# Patient Record
Sex: Male | Born: 1955 | Race: White | Hispanic: No | Marital: Married | Smoking: Current every day smoker
Health system: Southern US, Community
[De-identification: ages and names within clinical notes are randomized; demographics above are authoritative.]

## PROBLEM LIST (undated history)

## (undated) DIAGNOSIS — Z87891 Personal history of nicotine dependence: Secondary | ICD-10-CM

## (undated) DIAGNOSIS — K921 Melena: Secondary | ICD-10-CM

## (undated) DIAGNOSIS — K121 Other forms of stomatitis: Secondary | ICD-10-CM

## (undated) DIAGNOSIS — C3490 Malignant neoplasm of unspecified part of unspecified bronchus or lung: Secondary | ICD-10-CM

## (undated) DIAGNOSIS — R079 Chest pain, unspecified: Secondary | ICD-10-CM

## (undated) DIAGNOSIS — K649 Unspecified hemorrhoids: Secondary | ICD-10-CM

## (undated) DIAGNOSIS — J449 Chronic obstructive pulmonary disease, unspecified: Secondary | ICD-10-CM

## (undated) DIAGNOSIS — I509 Heart failure, unspecified: Secondary | ICD-10-CM

## (undated) DIAGNOSIS — I7300 Raynaud's syndrome without gangrene: Secondary | ICD-10-CM

## (undated) DIAGNOSIS — R059 Cough, unspecified: Secondary | ICD-10-CM

## (undated) DIAGNOSIS — IMO0001 Reserved for inherently not codable concepts without codable children: Secondary | ICD-10-CM

## (undated) DIAGNOSIS — G4730 Sleep apnea, unspecified: Secondary | ICD-10-CM

## (undated) DIAGNOSIS — J189 Pneumonia, unspecified organism: Secondary | ICD-10-CM

## (undated) DIAGNOSIS — R490 Dysphonia: Secondary | ICD-10-CM

## (undated) DIAGNOSIS — G4733 Obstructive sleep apnea (adult) (pediatric): Secondary | ICD-10-CM

## (undated) DIAGNOSIS — B019 Varicella without complication: Secondary | ICD-10-CM

## (undated) DIAGNOSIS — Z860100 Personal history of colon polyps, unspecified: Secondary | ICD-10-CM

## (undated) DIAGNOSIS — T4145XA Adverse effect of unspecified anesthetic, initial encounter: Secondary | ICD-10-CM

## (undated) DIAGNOSIS — M75100 Unspecified rotator cuff tear or rupture of unspecified shoulder, not specified as traumatic: Secondary | ICD-10-CM

## (undated) DIAGNOSIS — I499 Cardiac arrhythmia, unspecified: Secondary | ICD-10-CM

## (undated) DIAGNOSIS — Z8619 Personal history of other infectious and parasitic diseases: Secondary | ICD-10-CM

## (undated) DIAGNOSIS — G43909 Migraine, unspecified, not intractable, without status migrainosus: Secondary | ICD-10-CM

## (undated) DIAGNOSIS — J45909 Unspecified asthma, uncomplicated: Secondary | ICD-10-CM

## (undated) DIAGNOSIS — I10 Essential (primary) hypertension: Secondary | ICD-10-CM

## (undated) DIAGNOSIS — T8859XA Other complications of anesthesia, initial encounter: Secondary | ICD-10-CM

## (undated) DIAGNOSIS — K219 Gastro-esophageal reflux disease without esophagitis: Secondary | ICD-10-CM

## (undated) DIAGNOSIS — R05 Cough: Secondary | ICD-10-CM

## (undated) DIAGNOSIS — I2510 Atherosclerotic heart disease of native coronary artery without angina pectoris: Secondary | ICD-10-CM

## (undated) DIAGNOSIS — F419 Anxiety disorder, unspecified: Secondary | ICD-10-CM

## (undated) DIAGNOSIS — I209 Angina pectoris, unspecified: Secondary | ICD-10-CM

## (undated) HISTORY — DX: Sleep apnea, unspecified: G47.30

## (undated) HISTORY — PX: SKIN GRAFT: SHX250

## (undated) HISTORY — PX: SEPTOPLASTY: SUR1290

## (undated) HISTORY — PX: NASAL SINUS SURGERY: SHX719

## (undated) HISTORY — DX: Migraine, unspecified, not intractable, without status migrainosus: G43.909

## (undated) HISTORY — PX: COLONOSCOPY: SHX174

## (undated) HISTORY — DX: Personal history of nicotine dependence: Z87.891

## (undated) HISTORY — DX: Essential (primary) hypertension: I10

## (undated) HISTORY — PX: CARDIAC CATHETERIZATION: SHX172

## (undated) HISTORY — PX: BACK SURGERY: SHX140

## (undated) HISTORY — PX: CERVICAL DISCECTOMY: SHX98

## (undated) HISTORY — PX: OTHER SURGICAL HISTORY: SHX169

## (undated) HISTORY — DX: Obstructive sleep apnea (adult) (pediatric): G47.33

## (undated) HISTORY — DX: Raynaud's syndrome without gangrene: I73.00

---

## 2000-02-26 ENCOUNTER — Encounter: Admission: RE | Admit: 2000-02-26 | Discharge: 2000-02-26 | Payer: Self-pay | Admitting: Neurosurgery

## 2000-02-26 ENCOUNTER — Encounter: Payer: Self-pay | Admitting: Neurosurgery

## 2000-06-24 ENCOUNTER — Ambulatory Visit (HOSPITAL_COMMUNITY): Admission: RE | Admit: 2000-06-24 | Discharge: 2000-06-24 | Payer: Self-pay | Admitting: Neurosurgery

## 2000-06-24 ENCOUNTER — Encounter: Payer: Self-pay | Admitting: Neurosurgery

## 2005-09-10 ENCOUNTER — Ambulatory Visit: Payer: Self-pay | Admitting: Internal Medicine

## 2005-11-15 ENCOUNTER — Ambulatory Visit: Payer: Self-pay | Admitting: Internal Medicine

## 2006-11-14 ENCOUNTER — Ambulatory Visit: Payer: Self-pay

## 2007-01-16 ENCOUNTER — Ambulatory Visit: Payer: Self-pay | Admitting: Pain Medicine

## 2007-02-10 ENCOUNTER — Ambulatory Visit: Payer: Self-pay | Admitting: Psychiatry

## 2007-11-21 ENCOUNTER — Ambulatory Visit: Payer: Self-pay | Admitting: Gastroenterology

## 2007-12-12 ENCOUNTER — Ambulatory Visit: Payer: Self-pay | Admitting: Gastroenterology

## 2008-01-08 ENCOUNTER — Ambulatory Visit: Payer: Self-pay | Admitting: Gastroenterology

## 2008-07-16 ENCOUNTER — Ambulatory Visit: Payer: Self-pay | Admitting: Gastroenterology

## 2008-10-15 ENCOUNTER — Ambulatory Visit: Payer: Self-pay | Admitting: Internal Medicine

## 2009-02-18 ENCOUNTER — Ambulatory Visit: Payer: Self-pay | Admitting: Internal Medicine

## 2009-03-10 ENCOUNTER — Ambulatory Visit: Payer: Self-pay | Admitting: Internal Medicine

## 2009-04-21 ENCOUNTER — Ambulatory Visit: Payer: Self-pay | Admitting: Internal Medicine

## 2009-07-28 ENCOUNTER — Ambulatory Visit: Payer: Self-pay | Admitting: Otolaryngology

## 2009-08-04 ENCOUNTER — Ambulatory Visit: Payer: Self-pay | Admitting: Unknown Physician Specialty

## 2009-08-06 ENCOUNTER — Ambulatory Visit: Payer: Self-pay | Admitting: Otolaryngology

## 2009-12-17 ENCOUNTER — Ambulatory Visit: Payer: Self-pay | Admitting: Physician Assistant

## 2010-04-01 ENCOUNTER — Ambulatory Visit: Payer: Self-pay | Admitting: Oncology

## 2010-04-30 ENCOUNTER — Ambulatory Visit: Payer: Self-pay | Admitting: Oncology

## 2010-05-01 ENCOUNTER — Ambulatory Visit: Payer: Self-pay | Admitting: Oncology

## 2010-06-01 ENCOUNTER — Ambulatory Visit: Payer: Self-pay | Admitting: Oncology

## 2010-08-20 ENCOUNTER — Ambulatory Visit: Payer: Self-pay | Admitting: Oncology

## 2010-09-01 ENCOUNTER — Ambulatory Visit: Payer: Self-pay | Admitting: Oncology

## 2010-10-29 ENCOUNTER — Ambulatory Visit: Payer: Self-pay | Admitting: Oncology

## 2010-11-01 ENCOUNTER — Ambulatory Visit: Payer: Self-pay | Admitting: Oncology

## 2011-01-28 ENCOUNTER — Ambulatory Visit: Payer: Self-pay | Admitting: Oncology

## 2011-01-31 ENCOUNTER — Ambulatory Visit: Payer: Self-pay | Admitting: Oncology

## 2011-04-28 ENCOUNTER — Ambulatory Visit: Payer: Self-pay | Admitting: Oncology

## 2011-05-02 ENCOUNTER — Ambulatory Visit: Payer: Self-pay | Admitting: Oncology

## 2011-05-16 ENCOUNTER — Ambulatory Visit: Payer: Self-pay | Admitting: Otolaryngology

## 2011-06-30 ENCOUNTER — Ambulatory Visit: Payer: Self-pay | Admitting: Oncology

## 2011-07-03 ENCOUNTER — Ambulatory Visit: Payer: Self-pay | Admitting: Oncology

## 2011-08-27 ENCOUNTER — Ambulatory Visit: Payer: Self-pay | Admitting: Oncology

## 2011-09-02 ENCOUNTER — Ambulatory Visit: Payer: Self-pay | Admitting: Oncology

## 2011-10-02 ENCOUNTER — Ambulatory Visit: Payer: Self-pay | Admitting: Oncology

## 2011-11-04 ENCOUNTER — Ambulatory Visit: Payer: Self-pay | Admitting: Sports Medicine

## 2011-11-12 ENCOUNTER — Ambulatory Visit: Payer: Self-pay | Admitting: Oncology

## 2011-11-12 LAB — CANCER CENTER HEMOGLOBIN: HGB: 17.6 g/dL (ref 13.0–18.0)

## 2011-12-03 ENCOUNTER — Ambulatory Visit: Payer: Self-pay | Admitting: Oncology

## 2011-12-31 ENCOUNTER — Ambulatory Visit: Payer: Self-pay | Admitting: Oncology

## 2011-12-31 LAB — CBC CANCER CENTER
Eosinophil #: 0.2 x10 3/mm (ref 0.0–0.7)
Lymphocyte #: 1.9 x10 3/mm (ref 1.0–3.6)
Lymphocyte %: 13.9 %
MCHC: 33.8 g/dL (ref 32.0–36.0)
Monocyte %: 1 %
Platelet: 204 x10 3/mm (ref 150–440)
RDW: 14 % (ref 11.5–14.5)
WBC: 13.8 x10 3/mm — ABNORMAL HIGH (ref 3.8–10.6)

## 2012-01-26 ENCOUNTER — Ambulatory Visit: Payer: Self-pay | Admitting: Unknown Physician Specialty

## 2012-01-26 DIAGNOSIS — I10 Essential (primary) hypertension: Secondary | ICD-10-CM

## 2012-01-26 LAB — URINALYSIS, COMPLETE
Blood: NEGATIVE
Glucose,UR: NEGATIVE mg/dL (ref 0–75)
Ketone: NEGATIVE
Leukocyte Esterase: NEGATIVE
Nitrite: NEGATIVE
RBC,UR: NONE SEEN /HPF (ref 0–5)

## 2012-01-31 ENCOUNTER — Ambulatory Visit: Payer: Self-pay | Admitting: Oncology

## 2012-02-08 ENCOUNTER — Ambulatory Visit: Payer: Self-pay | Admitting: Unknown Physician Specialty

## 2012-07-04 DIAGNOSIS — E291 Testicular hypofunction: Secondary | ICD-10-CM | POA: Insufficient documentation

## 2012-07-04 DIAGNOSIS — Z79899 Other long term (current) drug therapy: Secondary | ICD-10-CM | POA: Insufficient documentation

## 2012-07-04 DIAGNOSIS — N529 Male erectile dysfunction, unspecified: Secondary | ICD-10-CM | POA: Insufficient documentation

## 2012-07-04 DIAGNOSIS — N401 Enlarged prostate with lower urinary tract symptoms: Secondary | ICD-10-CM | POA: Insufficient documentation

## 2012-07-18 ENCOUNTER — Ambulatory Visit: Payer: Self-pay | Admitting: Gastroenterology

## 2012-07-20 LAB — PATHOLOGY REPORT

## 2012-08-29 ENCOUNTER — Ambulatory Visit: Payer: Self-pay | Admitting: Gastroenterology

## 2012-09-08 ENCOUNTER — Ambulatory Visit: Payer: Self-pay | Admitting: Gastroenterology

## 2012-12-25 ENCOUNTER — Ambulatory Visit: Payer: Self-pay | Admitting: Otolaryngology

## 2012-12-25 LAB — CBC WITH DIFFERENTIAL/PLATELET
Basophil %: 1.2 %
Eosinophil #: 0.2 10*3/uL (ref 0.0–0.7)
HCT: 47.7 % (ref 40.0–52.0)
HGB: 16.8 g/dL (ref 13.0–18.0)
Lymphocyte #: 2.5 10*3/uL (ref 1.0–3.6)
Monocyte #: 0.7 x10 3/mm (ref 0.2–1.0)
Monocyte %: 5.6 %
Neutrophil %: 70.9 %
WBC: 12 10*3/uL — ABNORMAL HIGH (ref 3.8–10.6)

## 2012-12-25 LAB — BASIC METABOLIC PANEL
Anion Gap: 3 — ABNORMAL LOW (ref 7–16)
Calcium, Total: 8.9 mg/dL (ref 8.5–10.1)
Creatinine: 0.88 mg/dL (ref 0.60–1.30)
EGFR (African American): >60
Osmolality: 270 (ref 275–301)
Sodium: 136 mmol/L (ref 136–145)

## 2013-01-03 ENCOUNTER — Ambulatory Visit: Payer: Self-pay | Admitting: Otolaryngology

## 2013-01-04 LAB — PATHOLOGY REPORT

## 2013-04-24 ENCOUNTER — Ambulatory Visit (INDEPENDENT_AMBULATORY_CARE_PROVIDER_SITE_OTHER): Payer: BC Managed Care – PPO | Admitting: Pulmonary Disease

## 2013-04-24 ENCOUNTER — Encounter: Payer: Self-pay | Admitting: Pulmonary Disease

## 2013-04-24 VITALS — BP 104/80 | HR 77 | Temp 98.3°F | Ht 72.0 in | Wt 200.0 lb

## 2013-04-24 DIAGNOSIS — F17200 Nicotine dependence, unspecified, uncomplicated: Secondary | ICD-10-CM | POA: Insufficient documentation

## 2013-04-24 DIAGNOSIS — R05 Cough: Secondary | ICD-10-CM

## 2013-04-24 DIAGNOSIS — Z720 Tobacco use: Secondary | ICD-10-CM

## 2013-04-24 DIAGNOSIS — R059 Cough, unspecified: Secondary | ICD-10-CM | POA: Insufficient documentation

## 2013-04-24 DIAGNOSIS — J449 Chronic obstructive pulmonary disease, unspecified: Secondary | ICD-10-CM

## 2013-04-24 DIAGNOSIS — G4733 Obstructive sleep apnea (adult) (pediatric): Secondary | ICD-10-CM

## 2013-04-24 MED ORDER — TRAMADOL HCL 50 MG PO TABS: 50.0000 mg | ORAL_TABLET | Freq: Four times a day (QID) | ORAL | Status: DC | PRN

## 2013-04-24 MED ORDER — BUPROPION HCL ER (SR) 100 MG PO TB12: 100.0000 mg | ORAL_TABLET | Freq: Two times a day (BID) | ORAL | Status: DC

## 2013-04-24 MED ORDER — TIOTROPIUM BROMIDE MONOHYDRATE 18 MCG IN CAPS: 18.0000 ug | ORAL_CAPSULE | Freq: Every day | RESPIRATORY_TRACT | Status: DC

## 2013-04-24 NOTE — Assessment & Plan Note (Signed)
This is do to ongoing smoking, postnasal drip from chronic rhinitis and sinus infections, and COPD.  I also believe that there is a component of airway irritation coming from chronic cough which is in fact causing more cough. We need to try to break the cycle of cough. With ongoing smoking this will be very difficult.  Plan: -Quit smoking, quit smoking, quit smoking -Tramadol to be used on an as-needed basis during a period of voice rest -Hard candies, warm beverages as needed to help soothe the throat during period of voice rest -Start Spiriva for COPD

## 2013-04-24 NOTE — Assessment & Plan Note (Signed)
He has not been compliant with therapy. Unfortunately given his coronary disease and hypertension untreated obstructive sleep apnea will magnify these problems.  Plan: -Educated to contact his CPAP supplier to find a better mask -Advised him to try using a mask while awake and watching television to try to get used to it -Encouraged regular CPAP use

## 2013-04-24 NOTE — Progress Notes (Signed)
Subjective:    Patient ID: Paul Carpenter, male    DOB: Jun 11, 1956, 57 y.o.   MRN: [id number]  HPI  This is a 57 year old male who smoked 2 to have packs of cigarettes daily since age 56 who comes to our clinic today for evaluation and management of COPD and cough. He had a normal childhood without respiratory illnesses. 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  For the last several months he's had a worse cough in the evenings. The cough persists throughout the daytime and is typically productive of clear to yellow sputum. It is quite frequent at night and keeps him up in the evenings. His heartburn is well controlled. He does have chronic sinus infections. He has had multiple sinus surgeries in the past. He has been treated for either bronchitis or sinus infections with antibiotics and steroids 5 or 6 times in the last 12 months. He has not been admitted for shortness of breath or respiratory problems.  He has used long acting bronchodilators and corticosteroids in the past but only on an as-needed basis and never really saw much improvement with them. He was diagnosed as COPD over 5 years ago by a pulmonologist he no longer practices here in town. He was also told he had emphysema.  He has been followed by cardiology, Dr. Avelino Leeds and apparently he has non-obstructive coronary artery disease.  He has tried quitting multiple times. He has used Chantix, Wellbutrin, and electronic cigarettes. He is also use nicotine gum in the past. Has had the best results with Wellbutrin. He notes that he only used 150 mg daily of Wellbutrin.    Past Medical History  Diagnosis Date  . Hypertension   . OSA (obstructive sleep apnea)     has CPAP but does not use     Family History  Problem Relation Age of Onset  . Heart disease Paternal  Grandmother   . Heart disease Father   . Heart attack Maternal Grandfather 52  . Prostate cancer Father      History   Social History  . Marital Status: Married    Spouse Name: N/A    Number of Children: 3  . Years of Education: N/A   Occupational History  . Holiday representative Work    Social History Main Topics  . Smoking status: Current Every Day Smoker -- 2.50 packs/day for 40 years    Types: Cigarettes  . Smokeless tobacco: Not on file  . Alcohol Use: 1.0 oz/week    2 drink(s) per week  . Drug Use: No  . Sexually Active: Not on file   Other Topics Concern  . Not on file   Social History Narrative  . No narrative on file     No Known Allergies   No outpatient prescriptions prior to visit.   No facility-administered medications prior to visit.      Review of Systems  Constitutional: Negative for fever, chills, activity change and appetite change.  HENT: Positive for congestion, rhinorrhea and postnasal drip. Negative for hearing loss, ear pain, sneezing, neck pain, neck stiffness and sinus pressure.   Eyes: Negative for redness, itching and visual disturbance.  Respiratory: Positive for cough, chest tightness and shortness of breath. Negative for wheezing.   Cardiovascular: Negative for chest pain, palpitations and leg swelling.  Gastrointestinal: Negative for  nausea, vomiting, abdominal pain, diarrhea, constipation, blood in stool and abdominal distention.  Musculoskeletal: Negative for myalgias, joint swelling, arthralgias and gait problem.  Skin: Negative for rash.  Neurological: Positive for dizziness and light-headedness. Negative for numbness and headaches.  Hematological: Bruises/bleeds easily.  Psychiatric/Behavioral: Negative for confusion and dysphoric mood.       Objective:   Physical Exam  Filed Vitals:   04/24/13 1550  BP: 104/80  Pulse: 77  Temp: 98.3 F (36.8 C)  TempSrc: Oral  Height: 6' (1.829 m)  Weight: 200 lb (90.719 kg)  SpO2: 96%   RA  Gen: well appearing, no acute distress HEENT: NCAT, PERRL, EOMi, OP clear, neck supple without masses PULM: Moderately reduced air movement but CTA B, normal percussion CV: RRR, no mgr, no JVD AB: BS+, soft, nontender, no hsm Ext: warm, no edema, no clubbing, no cyanosis Derm: no rash or skin breakdown Neuro: A&Ox4, CN II-XII intact, strength 5/5 in all 4 extremities      Assessment & Plan:   COPD (chronic obstructive pulmonary disease) COPD: GOLD Stage 2, Grade C Combined recommendations from the [employer], [employer] of [employer]icians, Designer, television/film set, European Respiratory Society (Qaseem A et al, Ann Intern Med. 2011;155(3):179) recommends tobacco cessation, pulmonary rehab (for symptomatic patients with an FEV1 < 50% predicted), supplemental oxygen (for patients with SaO2 <88% or paO2 <55), and appropriate bronchodilator therapy.  In regards to long acting bronchodilators, they recommend monotherapy (FEV1 60-80% with symptoms weak evidence, FEV1 with symptoms <60% strong evidence), or combination therapy (FEV1 <60% with symptoms, strong recommendation, moderate evidence).  One should also provide patients with annual immunizations and consider therapy for prevention of COPD exacerbations (ie. roflumilast or azithromycin) when appopriate.  -O2 therapy: not indicated -Immunizations: Flu UTD, update pneumovax next visit -Tobacco use: Educated at length to quit -Exercise: encouraged regular exercise -Bronchodilator therapy: Start Spiriva, continue asmanex -Exacerbation prevention: Spiriva   Cough This is do to ongoing smoking, postnasal drip from chronic rhinitis and sinus infections, and COPD.  I also believe that there is a component of airway irritation coming from chronic cough which is in fact causing more cough. We need to try to break the cycle of cough. With ongoing smoking this will be very difficult.  Plan: -Quit smoking, quit  smoking, quit smoking -Tramadol to be used on an as-needed basis during a period of voice rest -Hard candies, warm beverages as needed to help soothe the throat during period of voice rest -Start Spiriva for COPD  Tobacco abuse Given his coronary disease and COPD he really needs to quit smoking. We talked about this at length today. He has benefited most from Wellbutrin in the past but no medication has ever helped him quit completely. He wants to quit but he states that he "hasn't tried hard enough". Chantix caused him to have bad dreams.  Plan: -Start Wellbutrin again  Obstructive sleep apnea He has not been compliant with therapy. Unfortunately given his coronary disease and hypertension untreated obstructive sleep apnea will magnify these problems.  Plan: -Educated to contact his CPAP supplier to find a better mask -Advised him to try using a mask while awake and watching television to try to get used to it -Encouraged regular CPAP use   Updated Medication List Outpatient Encounter Prescriptions as of 04/24/2013  Medication Sig Dispense Refill  . atorvastatin (LIPITOR) 40 MG tablet Take 1 tablet by mouth daily.      Marland Kitchen doxycycline (VIBRAMYCIN) 100 MG capsule  Take 1 capsule by mouth 2 (two) times daily.      . fluticasone (FLONASE) 50 MCG/ACT nasal spray Place 1 spray into the nose 2 (two) times daily.      . hydrochlorothiazide (MICROZIDE) 12.5 MG capsule Take 1 capsule by mouth daily.      . mirtazapine (REMERON) 15 MG tablet Take 1 tablet by mouth at bedtime as needed.      . mometasone (ASMANEX) 220 MCG/INH inhaler Inhale 1 puff into the lungs 2 (two) times daily.      . pantoprazole (PROTONIX) 20 MG tablet Take 1 tablet by mouth daily.      . propranolol ER (INDERAL LA) 160 MG SR capsule Take 1 capsule by mouth daily.      Marland Kitchen testosterone enanthate (DELATESTRYL) 200 MG/ML injection Inject 1.3 mg into the muscle every 14 (fourteen) days. For IM use only      . buPROPion  (WELLBUTRIN SR) 100 MG 12 hr tablet Take 1 tablet (100 mg total) by mouth 2 (two) times daily.  60 tablet  2  . tiotropium (SPIRIVA HANDIHALER) 18 MCG inhalation capsule Place 1 capsule (18 mcg total) into inhaler and inhale daily.  30 capsule  2  . traMADol (ULTRAM) 50 MG tablet Take 1 tablet (50 mg total) by mouth every 6 (six) hours as needed for pain.  30 tablet  1   No facility-administered encounter medications on file as of 04/24/2013.

## 2013-04-24 NOTE — Assessment & Plan Note (Signed)
COPD: GOLD Stage 2, Grade C Combined recommendations from the [employer]	[place], [employer], Designer, television/film set, European Respiratory Society (Qaseem A et al, Ann Intern Med. 2011;155(3):179) recommends tobacco cessation, pulmonary rehab (for symptomatic patients with an FEV1 < 50% predicted), supplemental oxygen (for patients with SaO2 <88% or paO2 <55), and appropriate bronchodilator therapy.  In regards to long acting bronchodilators, they recommend monotherapy (FEV1 60-80% with symptoms weak evidence, FEV1 with symptoms <60% strong evidence), or combination therapy (FEV1 <60% with symptoms, strong recommendation, moderate evidence).  One should also provide patients with annual immunizations and consider therapy for prevention of COPD exacerbations (ie. roflumilast or azithromycin) when appopriate.  -O2 therapy: not indicated -Immunizations: Flu UTD, update pneumovax next visit -Tobacco use: Educated at length to quit -Exercise: encouraged regular exercise -Bronchodilator therapy: Start Spiriva, continue asmanex -Exacerbation prevention: Spiriva

## 2013-04-24 NOTE — Assessment & Plan Note (Signed)
Given his coronary disease and COPD he really needs to quit smoking. We talked about this at length today. He has benefited most from Wellbutrin in the past but no medication has ever helped him quit completely. He wants to quit but he states that he "hasn't tried hard enough". Chantix caused him to have bad dreams.  Plan: -Start Wellbutrin again

## 2013-04-24 NOTE — Patient Instructions (Signed)
Take the spiriva one puff daily no matter how you feel  Quit smoking, quit smoking, quit smoking  Take the Wellbutrin one pill daily for one week then two times a day  Take the tramadol as needed for cough during three days of cough suppresion/and voice rest  We will see you back in 4-6 weeks or sooner as needed

## 2013-04-25 ENCOUNTER — Ambulatory Visit (INDEPENDENT_AMBULATORY_CARE_PROVIDER_SITE_OTHER)
Admission: RE | Admit: 2013-04-25 | Discharge: 2013-04-25 | Disposition: A | Discharge status: Home / Self Care | Payer: BC Managed Care – PPO | Source: Ambulatory Visit | Attending: Pulmonary Disease | Admitting: Pulmonary Disease

## 2013-04-25 DIAGNOSIS — J449 Chronic obstructive pulmonary disease, unspecified: Secondary | ICD-10-CM

## 2013-04-27 ENCOUNTER — Telehealth: Payer: Self-pay | Admitting: Pulmonary Disease

## 2013-04-27 NOTE — Telephone Encounter (Signed)
Notes Recorded by Lupita Leash, MD on 04/26/2013 at 12:31 AM L,  Please let him know that his CXR showed emphysema, but nothing else.    I spoke with patient about results and he verbalized understanding and had no questions

## 2013-05-28 ENCOUNTER — Ambulatory Visit: Payer: Self-pay | Admitting: Gastroenterology

## 2013-05-29 ENCOUNTER — Encounter: Payer: Self-pay | Admitting: Pulmonary Disease

## 2013-05-29 ENCOUNTER — Ambulatory Visit (INDEPENDENT_AMBULATORY_CARE_PROVIDER_SITE_OTHER): Payer: BC Managed Care – PPO | Admitting: Pulmonary Disease

## 2013-05-29 VITALS — BP 114/80 | HR 80 | Temp 98.5°F

## 2013-05-29 DIAGNOSIS — R05 Cough: Secondary | ICD-10-CM

## 2013-05-29 DIAGNOSIS — F17200 Nicotine dependence, unspecified, uncomplicated: Secondary | ICD-10-CM

## 2013-05-29 DIAGNOSIS — J4489 Other specified chronic obstructive pulmonary disease: Secondary | ICD-10-CM

## 2013-05-29 DIAGNOSIS — Z720 Tobacco use: Secondary | ICD-10-CM

## 2013-05-29 DIAGNOSIS — J449 Chronic obstructive pulmonary disease, unspecified: Secondary | ICD-10-CM

## 2013-05-29 DIAGNOSIS — R059 Cough, unspecified: Secondary | ICD-10-CM

## 2013-05-29 MED ORDER — LEVOFLOXACIN 500 MG PO TABS: 500.0000 mg | ORAL_TABLET | Freq: Every day | ORAL | Status: AC

## 2013-05-29 MED ORDER — AEROCHAMBER PLUS MISC: Status: DC

## 2013-05-29 NOTE — Assessment & Plan Note (Signed)
It is not clear that the Spiriva made any difference and I question whether or not it is making his cough worse.    Plan: -quit smoking -change spiriva to Dulera bid

## 2013-05-29 NOTE — Patient Instructions (Signed)
Take Dulera two puffs twice a day instead of the Spiriva for one month; use it with a spacer  Take the Levaquin daily for one week with yogurt  Use the tramadol during the day for three days to suppress your cough.  During this time, try to minimize talking as much as possible  Stop smoking!  Let us know if you are not better before 3-4 weeks  We will see you back in 4 weeks or sooner if needed

## 2013-05-29 NOTE — Assessment & Plan Note (Addendum)
His cough is worse in the last few weeks likely due to laryngeal irritation from a sinus infection (he has green mucus production), ongoing smoking, and inflammation related to cough.  He has not really used the tramadol as directed on the last visit.  This cough is really not going to go away if he doesn't quit smoking.  Plan: -treat sinus infection with Levaquin -tramadol more often and voice rest -if no improvement in 3-4 weeks, may need to see ENT for laryngoscopy given smoking history

## 2013-05-29 NOTE — Progress Notes (Signed)
Subjective:    Patient ID: Paul Carpenter, male    DOB: 1956/03/31, 57 y.o.   MRN: [id number]  Synopsis: Paul Carpenter first saw the [facility] pulmonary clinic in July of 2014 for COPD. Simple spirometry showed a ratio 62%, FEV1 of 2.41 L (60% predicted). He had smoked 2 packs of cigarettes daily since his teenage years and continues to smoke. He has had multiple sinus infections and 2 sinus surgeries in the past.  HPI  05/29/2013 ROV >> Paul Carpenter thinks that his cough is actually worse since last visit. He is also experienced hoarseness since the last visit. He notes sinus congestion and green mucus production frequently. He takes Ultram at night it doesn't particularly making sleepy. He doesn't recall coughing frequently throughout the night but he does cough sometimes when he gets up to go the bathroom. He feels like he has a constant build up in his throat and congestion. He is constantly clearing his throat. Unclear to him that this is secondary to the Spiriva. She stopped taking the Spiriva 3 days ago to see if that would help, but unfortunately he still has the symptoms.   Past Medical History  Diagnosis Date  . Hypertension   . OSA (obstructive sleep apnea)     has CPAP but does not use     Review of Systems  Constitutional: Negative for fever, chills and fatigue.  HENT: Positive for congestion, rhinorrhea and postnasal drip.   Respiratory: Positive for cough, shortness of breath and wheezing.   Cardiovascular: Negative for chest pain, palpitations and leg swelling.       Objective:   Physical Exam Filed Vitals:   05/29/13 1520  BP: 114/80  Pulse: 80  Temp: 98.5 F (36.9 C)  TempSrc: Oral  SpO2: 95%   Gen: well appearing, no acute distress HEENT: NCAT,  EOMi, OP clear, PULM: few wheezes in bases, minimal; good air movement, normal percussion CV: RRR, no mgr, no JVD AB: BS+, soft, nontender Ext: warm, no edema, no clubbing, no cyanosis  05/2013 Simple  spirometry > ratio 62%, FEV1 2.41L (60% pred)     Assessment & Plan:   COPD (chronic obstructive pulmonary disease) It is not clear that the Spiriva made any difference and I question whether or not it is making his cough worse.    Plan: -quit smoking -change spiriva to Dulera bid  Cough His cough is worse in the last few weeks likely due to laryngeal irritation from a sinus infection (he has green mucus production), ongoing smoking, and inflammation related to cough.  He has not really used the tramadol as directed on the last visit.  This cough is really not going to go away if he doesn't quit smoking.  Plan: -treat sinus infection with Levaquin -tramadol more often and voice rest -if no improvement in 3-4 weeks, may need to see ENT for laryngoscopy given smoking history  Tobacco abuse I think it is too soon to change the Wellbutrin, but so far it hasn't helped much.    Plan: -continue Wellbutrin for now, consider change on next visit   Updated Medication List Outpatient Encounter Prescriptions as of 05/29/2013  Medication Sig Dispense Refill  . atorvastatin (LIPITOR) 40 MG tablet Take 1 tablet by mouth daily.      Marland Kitchen buPROPion (WELLBUTRIN SR) 100 MG 12 hr tablet Take 1 tablet (100 mg total) by mouth 2 (two) times daily.  60 tablet  2  . hydrochlorothiazide (MICROZIDE) 12.5 MG capsule Take 1 capsule  by mouth daily.      . mirtazapine (REMERON) 15 MG tablet Take 1 tablet by mouth at bedtime as needed.      . propranolol ER (INDERAL LA) 160 MG SR capsule Take 1 capsule by mouth daily.      Marland Kitchen testosterone enanthate (DELATESTRYL) 200 MG/ML injection Inject 1.3 mg into the muscle every 14 (fourteen) days. For IM use only      . tiotropium (SPIRIVA HANDIHALER) 18 MCG inhalation capsule Place 1 capsule (18 mcg total) into inhaler and inhale daily.  30 capsule  2  . traMADol (ULTRAM) 50 MG tablet Take 1 tablet (50 mg total) by mouth every 6 (six) hours as needed for pain.  30 tablet  1   . levofloxacin (LEVAQUIN) 500 MG tablet Take 1 tablet (500 mg total) by mouth daily.  7 tablet  0  . pantoprazole (PROTONIX) 20 MG tablet Take 1 tablet by mouth daily.      Marland Kitchen Spacer/Aero-Holding Chambers (AEROCHAMBER PLUS) inhaler Use as instructed  1 each  0  . [DISCONTINUED] doxycycline (VIBRAMYCIN) 100 MG capsule Take 1 capsule by mouth 2 (two) times daily.      . [DISCONTINUED] fluticasone (FLONASE) 50 MCG/ACT nasal spray Place 1 spray into the nose 2 (two) times daily.      . [DISCONTINUED] mometasone (ASMANEX) 220 MCG/INH inhaler Inhale 1 puff into the lungs 2 (two) times daily.       No facility-administered encounter medications on file as of 05/29/2013.

## 2013-05-29 NOTE — Assessment & Plan Note (Signed)
I think it is too soon to change the Wellbutrin, but so far it hasn't helped much.    Plan: -continue Wellbutrin for now, consider change on next visit

## 2013-06-06 ENCOUNTER — Ambulatory Visit: Payer: Self-pay | Admitting: Gastroenterology

## 2013-06-22 ENCOUNTER — Telehealth: Payer: Self-pay | Admitting: Pulmonary Disease

## 2013-06-22 DIAGNOSIS — R059 Cough, unspecified: Secondary | ICD-10-CM

## 2013-06-22 DIAGNOSIS — R05 Cough: Secondary | ICD-10-CM

## 2013-06-22 NOTE — Telephone Encounter (Signed)
LMOMTCB x1 for pt 

## 2013-06-25 MED ORDER — TRAMADOL HCL 50 MG PO TABS: 50.0000 mg | ORAL_TABLET | Freq: Four times a day (QID) | ORAL | Status: DC | PRN

## 2013-06-25 NOTE — Telephone Encounter (Signed)
Last OV 05-29-13 Dr.Mcquaid. Pt is asking for a refill on tramadol. Last Rx was given on 05-29-13 for #30 with instruct for every 6 hours. Pt states as long as he takes the tramadol every 6 hours he is able to suppress his cough mostly, but if he does not take it on schedule his cough is worse. Please advise if ok to refill tramadol and for what quantity? Carron Curie, CMA

## 2013-06-25 NOTE — Telephone Encounter (Signed)
Ok x one only then need to regroup with mcquaid

## 2013-06-25 NOTE — Telephone Encounter (Signed)
Pt has an appt on 07-03-13. rx sent. Carron Curie, CMA

## 2013-07-03 ENCOUNTER — Encounter: Payer: Self-pay | Admitting: Pulmonary Disease

## 2013-07-03 ENCOUNTER — Ambulatory Visit (INDEPENDENT_AMBULATORY_CARE_PROVIDER_SITE_OTHER): Payer: BC Managed Care – PPO | Admitting: Pulmonary Disease

## 2013-07-03 VITALS — BP 132/80 | HR 77 | Temp 98.7°F | Ht 72.0 in | Wt 198.8 lb

## 2013-07-03 DIAGNOSIS — R05 Cough: Secondary | ICD-10-CM

## 2013-07-03 DIAGNOSIS — Z720 Tobacco use: Secondary | ICD-10-CM

## 2013-07-03 DIAGNOSIS — R059 Cough, unspecified: Secondary | ICD-10-CM

## 2013-07-03 DIAGNOSIS — J449 Chronic obstructive pulmonary disease, unspecified: Secondary | ICD-10-CM

## 2013-07-03 DIAGNOSIS — F17200 Nicotine dependence, unspecified, uncomplicated: Secondary | ICD-10-CM

## 2013-07-03 MED ORDER — TRAMADOL HCL 50 MG PO TABS: 50.0000 mg | ORAL_TABLET | Freq: Four times a day (QID) | ORAL | Status: DC | PRN

## 2013-07-03 NOTE — Assessment & Plan Note (Signed)
This has been a stable interval, but then if he still suffers from cough and hoarseness which I believe is directly related to his ongoing tobacco use.  Plan: -Continue Dulera -Flu shot as soon as available -Followup with me in 3-4 months

## 2013-07-03 NOTE — Assessment & Plan Note (Signed)
We discussed this at length again today. He continues to smoke one and a half packs to 2 packs a day. This is the primary cause for his ongoing cough, hoarseness, shortness of breath which is lead to COPD. I advised him that the COPD is a permanent problem which will not get better. Hopefully quitting smoking will help with the cough. He has not done well with Chantix and Wellbutrin hasn't helped much either. I advised E. cigarettes as well as counseling from the [place] quit line. He also states that he will pursue counseling options from [facility] and [facility] as they have a contacted him to help with this in the past.

## 2013-07-03 NOTE — Assessment & Plan Note (Signed)
This has improved, but he continues to have hoarseness. I think the hoarseness is most likely related to his ongoing tobacco use, but it does concern me enough to feel that he needs to have his larynx evaluated again by [facility]. I've asked him to followup with them again for evaluation.

## 2013-07-03 NOTE — Patient Instructions (Signed)
Keep taking the [facility] as you are doing Call 1-800-Quit-Now to get free counseling for smoking from the state of [place] See Dr. Andee Poles about the hoarseness Use an e-cigarette if needed to help you quit smoking  We will see you back in 3-4 months or sooner if needed

## 2013-07-03 NOTE — Progress Notes (Signed)
Subjective:    Patient ID: Paul Carpenter, male    DOB: [birth year], 57 y.o.   MRN: [id number]  Synopsis: Paul Carpenter first saw the [facility] pulmonary clinic in July of 2014 for COPD. Simple spirometry showed a ratio 62%, FEV1 of 2.41 L (60% predicted). He had smoked 2 packs of cigarettes daily since his teenage years and continues to smoke. He has had multiple sinus infections and 2 sinus surgeries in the past.  HPI   05/29/2013 ROV >> Paul Carpenter thinks that his cough is actually worse since last visit. He is also experienced hoarseness since the last visit. He notes sinus congestion and green mucus production frequently. He takes Ultram at night it doesn't particularly making sleepy. He doesn't recall coughing frequently throughout the night but he does cough sometimes when he gets up to go the bathroom. He feels like he has a constant build up in his throat and congestion. He is constantly clearing his throat. Unclear to him that this is secondary to the Spiriva. She stopped taking the Spiriva 3 days ago to see if that would help, but unfortunately he still has the symptoms.  07/03/2013 ROV >> Paul Carpenter says that his cough is not as productive as it was since the last visit. It is improved a little bit. He still has a cough at night. He still has hoarseness. The Elwin Sleight has been tolerated well but he can't tell that it made a huge difference in his symptoms. He continues to smoke one and a half to 2 packs of cigarettes daily. He does not think the Wellbutrin has helped with this.   Past Medical History  Diagnosis Date  . Hypertension   . OSA (obstructive sleep apnea)     has CPAP but does not use     Review of Systems  Constitutional: Negative for fever, chills and fatigue.  HENT: Negative for congestion, rhinorrhea and postnasal drip.   Respiratory: Positive for cough and shortness of breath. Negative for wheezing.   Cardiovascular: Negative for chest pain, palpitations and leg  swelling.       Objective:   Physical Exam  Filed Vitals:   07/03/13 1034  BP: 132/80  Pulse: 77  Temp: 98.7 F (37.1 C)  TempSrc: Oral  Height: 6' (1.829 m)  Weight: 198 lb 12.8 oz (90.175 kg)  SpO2: 97%   Gen: well appearing, no acute distress HEENT: NCAT,  EOMi, OP clear, PULM: Good air movement bilaterally, no wheezing CV: RRR, no mgr, no JVD AB: BS+, soft, nontender Ext: warm, no edema, no clubbing, no cyanosis  05/2013 Simple spirometry > ratio 62%, FEV1 2.41L (60% pred)     Assessment & Plan:   COPD (chronic obstructive pulmonary disease) This has been a stable interval, but then if he still suffers from cough and hoarseness which I believe is directly related to his ongoing tobacco use.  Plan: -Continue Dulera -Flu shot as soon as available -Followup with me in 3-4 months  Cough This has improved, but he continues to have hoarseness. I think the hoarseness is most likely related to his ongoing tobacco use, but it does concern me enough to feel that he needs to have his larynx evaluated again by [facility]. I've asked him to followup with them again for evaluation.  Tobacco abuse We discussed this at length again today. He continues to smoke one and a half packs to 2 packs a day. This is the primary cause for his ongoing cough,  hoarseness, shortness of breath which is lead to COPD. I advised him that the COPD is a permanent problem which will not get better. Hopefully quitting smoking will help with the cough. He has not done well with Chantix and Wellbutrin hasn't helped much either. I advised E. cigarettes as well as counseling from the [place] quit line. He also states that he will pursue counseling options from [facility] and [facility] as they have a contacted him to help with this in the past.    Updated Medication List Outpatient Encounter Prescriptions as of 07/03/2013  Medication Sig Dispense Refill  . atorvastatin (LIPITOR) 40  MG tablet Take 1 tablet by mouth daily.      Marland Kitchen buPROPion (WELLBUTRIN SR) 100 MG 12 hr tablet Take 1 tablet (100 mg total) by mouth 2 (two) times daily.  60 tablet  2  . hydrochlorothiazide (MICROZIDE) 12.5 MG capsule Take 1 capsule by mouth daily.      . mirtazapine (REMERON) 15 MG tablet Take 1 tablet by mouth at bedtime as needed.      . Mometasone Furo-Formoterol Fum (DULERA IN) Inhale 2 puffs into the lungs 2 (two) times daily.      . pantoprazole (PROTONIX) 20 MG tablet Take 1 tablet by mouth daily.      Marland Kitchen Spacer/Aero-Holding Chambers (AEROCHAMBER PLUS) inhaler Use as instructed  1 each  0  . testosterone enanthate (DELATESTRYL) 200 MG/ML injection Inject 1.3 mg into the muscle every 14 (fourteen) days. For IM use only      . traMADol (ULTRAM) 50 MG tablet Take 1 tablet (50 mg total) by mouth every 6 (six) hours as needed for pain.  30 tablet  0  . propranolol ER (INDERAL LA) 160 MG SR capsule Take 1 capsule by mouth daily.      . [DISCONTINUED] tiotropium (SPIRIVA HANDIHALER) 18 MCG inhalation capsule Place 1 capsule (18 mcg total) into inhaler and inhale daily.  30 capsule  2   No facility-administered encounter medications on file as of 07/03/2013.

## 2013-07-13 ENCOUNTER — Other Ambulatory Visit: Payer: Self-pay | Admitting: *Deleted

## 2013-07-13 DIAGNOSIS — Z720 Tobacco use: Secondary | ICD-10-CM

## 2013-07-13 MED ORDER — BUPROPION HCL ER (SR) 100 MG PO TB12: 100.0000 mg | ORAL_TABLET | Freq: Two times a day (BID) | ORAL | Status: DC

## 2013-07-16 ENCOUNTER — Telehealth: Payer: Self-pay | Admitting: Pulmonary Disease

## 2013-07-16 MED ORDER — MOMETASONE FURO-FORMOTEROL FUM 200-5 MCG/ACT IN AERO: 2.0000 | INHALATION_SPRAY | Freq: Two times a day (BID) | RESPIRATORY_TRACT | Status: DC

## 2013-07-16 NOTE — Telephone Encounter (Signed)
I spoke with pt and advised him of last OV note BQ does want pt to stay on this. He needed RX sent. I have done so and nothing further needed

## 2013-09-06 ENCOUNTER — Other Ambulatory Visit: Payer: Self-pay

## 2013-11-14 ENCOUNTER — Ambulatory Visit: Payer: Self-pay | Admitting: Oncology

## 2013-11-23 ENCOUNTER — Telehealth: Payer: Self-pay | Admitting: Pulmonary Disease

## 2013-11-24 DIAGNOSIS — N200 Calculus of kidney: Secondary | ICD-10-CM | POA: Insufficient documentation

## 2013-11-26 NOTE — Telephone Encounter (Signed)
Sent letter for sched ROV

## 2013-12-02 ENCOUNTER — Ambulatory Visit: Payer: Self-pay | Admitting: Oncology

## 2013-12-06 ENCOUNTER — Encounter: Payer: Self-pay | Admitting: Pulmonary Disease

## 2013-12-06 ENCOUNTER — Ambulatory Visit (INDEPENDENT_AMBULATORY_CARE_PROVIDER_SITE_OTHER): Payer: BC Managed Care – PPO | Admitting: Pulmonary Disease

## 2013-12-06 ENCOUNTER — Ambulatory Visit: Payer: Self-pay | Admitting: Pulmonary Disease

## 2013-12-06 ENCOUNTER — Telehealth: Payer: Self-pay | Admitting: Pulmonary Disease

## 2013-12-06 VITALS — BP 138/84 | HR 82 | Ht 72.0 in | Wt 207.0 lb

## 2013-12-06 DIAGNOSIS — R059 Cough, unspecified: Secondary | ICD-10-CM

## 2013-12-06 DIAGNOSIS — R079 Chest pain, unspecified: Secondary | ICD-10-CM | POA: Insufficient documentation

## 2013-12-06 DIAGNOSIS — R911 Solitary pulmonary nodule: Secondary | ICD-10-CM

## 2013-12-06 DIAGNOSIS — Z720 Tobacco use: Secondary | ICD-10-CM

## 2013-12-06 DIAGNOSIS — F17200 Nicotine dependence, unspecified, uncomplicated: Secondary | ICD-10-CM

## 2013-12-06 DIAGNOSIS — R918 Other nonspecific abnormal finding of lung field: Secondary | ICD-10-CM | POA: Insufficient documentation

## 2013-12-06 DIAGNOSIS — G4733 Obstructive sleep apnea (adult) (pediatric): Secondary | ICD-10-CM

## 2013-12-06 DIAGNOSIS — R05 Cough: Secondary | ICD-10-CM

## 2013-12-06 DIAGNOSIS — J449 Chronic obstructive pulmonary disease, unspecified: Secondary | ICD-10-CM

## 2013-12-06 MED ORDER — ALBUTEROL SULFATE HFA 108 (90 BASE) MCG/ACT IN AERS: 2.0000 | INHALATION_SPRAY | Freq: Four times a day (QID) | RESPIRATORY_TRACT | Status: DC | PRN

## 2013-12-06 MED ORDER — VARENICLINE TARTRATE 1 MG PO TABS: ORAL_TABLET | ORAL | Status: DC

## 2013-12-06 MED ORDER — FLUTICASONE PROPIONATE 50 MCG/ACT NA SUSP: 2.0000 | Freq: Every day | NASAL | Status: DC

## 2013-12-06 NOTE — Progress Notes (Signed)
Subjective:    Patient ID: Paul Carpenter, male    DOB: June 29, 1956, 58 y.o.   MRN: [id number]  Synopsis: [name] first saw the [facility] pulmonary clinic in July of 2014 for COPD. Simple spirometry showed a ratio 62%, FEV1 of 2.41 L (60% predicted). He had smoked 2 packs of cigarettes daily since his teenage years and continues to smoke. He has had multiple sinus infections and 2 sinus surgeries in the past.  HPI   05/29/2013 ROV >> Lindsay thinks that his cough is actually worse since last visit. He is also experienced hoarseness since the last visit. He notes sinus congestion and green mucus production frequently. He takes Ultram at night it doesn't particularly making sleepy. He doesn't recall coughing frequently throughout the night but he does cough sometimes when he gets up to go the bathroom. He feels like he has a constant build up in his throat and congestion. He is constantly clearing his throat. Unclear to him that this is secondary to the Spiriva. She stopped taking the Spiriva 3 days ago to see if that would help, but unfortunately he still has the symptoms.  07/03/2013 ROV >> Montrez says that his cough is not as productive as it was since the last visit. It is improved a little bit. He still has a cough at night. He still has hoarseness. The Ruthe Mannan has been tolerated well but he can't tell that it made a huge difference in his symptoms. He continues to smoke one and a half to 2 packs of cigarettes daily. He does not think the Wellbutrin has helped with this.  12/06/2013 ROV >> Amirr says that is worse since the last visit.  He is coughing a lot at night and during the day.  He has has not been to [facility] like we asked.  He has a lot of congestion in his throat.  He is having a lot of sinus congestion and drainage. Sometimes this is green in color.  He is not currently taking any sinus sprays or over the counter decongestants.  He feels more short of breath with  exertion and sometimes at night.  He occasionally wakes up gasping for air.  He does not use his CPAP machine.   He continues to smoke, he is currently smoking 1.5 packs per day. He is on the road a lot and it is hard to not smoke.  He has been having a lot of chest pain in his right chest under his R chest.  It feels like a knife.   Past Medical History  Diagnosis Date  . Hypertension   . OSA (obstructive sleep apnea)     has CPAP but does not use     Review of Systems  Constitutional: Negative for fever, chills and fatigue.  HENT: Negative for congestion, postnasal drip and rhinorrhea.   Respiratory: Positive for cough and shortness of breath. Negative for wheezing.   Cardiovascular: Negative for chest pain, palpitations and leg swelling.       Objective:   Physical Exam  Filed Vitals:   12/06/13 1039  BP: 138/84  Pulse: 82  Height: 6' (1.829 m)  Weight: 207 lb (93.895 kg)  SpO2: 97%  RA  Gen: well appearing, no acute distress HEENT: NCAT,  EOMi, OP clear, PULM: Good air movement bilaterally, no wheezing CV: RRR, no mgr, no JVD AB: BS+, soft, nontender Ext: warm, no edema, no clubbing, no cyanosis  05/2013 Simple spirometry > ratio 62%, FEV1 2.41L (  60% pred)     Assessment & Plan:   Cough This problem continues. It is do to his ongoing smoking, postnasal drip, and COPD. Unfortunately, he did not get to [place] urinalysis and throat to evaluate his sinus and laryngeal issues as we requested. He is not using his Flonase appropriately.  Plan: -Instructed on proper Flonase use -Quit smoking  COPD (chronic obstructive pulmonary disease) He notes increased dyspnea and cough since last visit. This is due to COPD and ongoing tobacco use.  He is concerned because he had a pulmonary nodule which is seen years ago and it bothers him that he has had some chest pain and increasing shortness of breath.  Plan: -Repeat CT chest do to history pulmonary nodule -Continue  Dulera -Stop smoking, stop smoking, stop smoking  Chest pain He has known coronary artery disease. His pain is right upper quadrant and sharp. He cannot correlate it with exertion or with eating. I told him that the differential diagnosis includes coronary ischemia versus biliary problems.  Plan: -Stop smoking -Followup with cardiology soon -Right upper quadrant ultrasound  Tobacco abuse We talked about this at length today. He is willing to try Chantix again. He understands the risks and side effects.  Obstructive sleep apnea He is noncompliant with CPAP. We talked about a dental appliance.  Solitary pulmonary nodule This was previously followed for several years and he did have a PET/CT. Apparently in 2009 it was deemed stable.  Given his ongoing tobacco use and worsening shortness of breath and chest pain it is worthwhile to reevaluate this and to look for other nodules. His risk for lung cancer is exceedingly high.    Updated Medication List Outpatient Encounter Prescriptions as of 12/06/2013  Medication Sig  . atorvastatin (LIPITOR) 40 MG tablet Take 1 tablet by mouth daily.  . hydrochlorothiazide (MICROZIDE) 12.5 MG capsule Take 1 capsule by mouth daily.  . mirtazapine (REMERON) 15 MG tablet Take 1 tablet by mouth at bedtime as needed.  . mometasone-formoterol (DULERA) 200-5 MCG/ACT AERO Inhale 2 puffs into the lungs 2 (two) times daily.  . pantoprazole (PROTONIX) 20 MG tablet Take 1 tablet by mouth daily.  . propranolol ER (INDERAL LA) 160 MG SR capsule Take 1 capsule by mouth daily.  Marland Kitchen Spacer/Aero-Holding Chambers (AEROCHAMBER PLUS) inhaler Use as instructed  . testosterone enanthate (DELATESTRYL) 200 MG/ML injection Inject 1.3 mg into the muscle every 14 (fourteen) days. For IM use only  . [DISCONTINUED] buPROPion (WELLBUTRIN SR) 100 MG 12 hr tablet Take 1 tablet (100 mg total) by mouth 2 (two) times daily.  . [DISCONTINUED] Mometasone Furo-Formoterol Fum (DULERA IN) Inhale  2 puffs into the lungs 2 (two) times daily.  . [DISCONTINUED] traMADol (ULTRAM) 50 MG tablet Take 1 tablet (50 mg total) by mouth every 6 (six) hours as needed (cough).

## 2013-12-06 NOTE — Telephone Encounter (Signed)
Returning call can be reached at [phone].[name]

## 2013-12-06 NOTE — Assessment & Plan Note (Signed)
He notes increased dyspnea and cough since last visit. This is due to COPD and ongoing tobacco use.  He is concerned because he had a pulmonary nodule which is seen years ago and it bothers him that he has had some chest pain and increasing shortness of breath.  Plan: -Repeat CT chest do to history pulmonary nodule -Continue Dulera -Stop smoking, stop smoking, stop smoking

## 2013-12-06 NOTE — Telephone Encounter (Signed)
STAT CT Abdomen showed: Small sludge build up in the gallbladder. Gallbladder wall is not thickened. Common bile duct appears normal. Previously seen liver cyst appears stable.  I have called and spoken with [place] in [place]. She is going to relay this message to BQ and also going to route this message to him.

## 2013-12-06 NOTE — Assessment & Plan Note (Signed)
He has known coronary artery disease. His pain is right upper quadrant and sharp. He cannot correlate it with exertion or with eating. I told him that the differential diagnosis includes coronary ischemia versus biliary problems.  Plan: -Stop smoking -Followup with cardiology soon -Right upper quadrant ultrasound

## 2013-12-06 NOTE — Telephone Encounter (Signed)
Pt is aware of results.  Nothing further needed at this time.

## 2013-12-06 NOTE — Patient Instructions (Signed)
We will set up a right upper quadrant ultrasound and a chest CT Quit smoking Use chantix > quit smoking when you are at 1mg  twice a day Use the flonase two puffs daily no matter how you feel Take chlorpheniramine-phenylephrine tabs as needed for sinus congestion and cough Go see your cardiologist if the pain in your chest is accelerating  We will see you back in 3-4 months or sooner if needed

## 2013-12-06 NOTE — Assessment & Plan Note (Signed)
He is noncompliant with CPAP. We talked about a dental appliance.

## 2013-12-06 NOTE — Assessment & Plan Note (Signed)
This problem continues. It is do to his ongoing smoking, postnasal drip, and COPD. Unfortunately, he did not get to [place] urinalysis and throat to evaluate his sinus and laryngeal issues as we requested. He is not using his Flonase appropriately.  Plan: -Instructed on proper Flonase use -Quit smoking

## 2013-12-06 NOTE — Addendum Note (Signed)
Addended by: Len Blalock on: 12/06/2013 02:27 PM   Modules accepted: Orders

## 2013-12-06 NOTE — Assessment & Plan Note (Signed)
We talked about this at length today. He is willing to try Chantix again. He understands the risks and side effects.

## 2013-12-06 NOTE — Assessment & Plan Note (Signed)
This was previously followed for several years and he did have a PET/CT. Apparently in 2009 it was deemed stable.  Given his ongoing tobacco use and worsening shortness of breath and chest pain it is worthwhile to reevaluate this and to look for other nodules. His risk for lung cancer is exceedingly high.

## 2013-12-06 NOTE — Telephone Encounter (Signed)
Please advise Caryl Pina if [facility] has reviewed these results. Pt is calling requesting results again.  Thanks!

## 2013-12-07 NOTE — Telephone Encounter (Signed)
Pt was seen 12/06/13.  Nothing further needed at this time.  Satira Anis

## 2013-12-10 ENCOUNTER — Ambulatory Visit: Payer: Self-pay | Admitting: Pulmonary Disease

## 2013-12-11 NOTE — Telephone Encounter (Signed)
Spoke with pt. He is requesting CT results done yesterday at [facility]. According to schedule BQ is off all week. Pt does not want to wait that long. Results printed for SN to review. Please advise thanks

## 2013-12-11 NOTE — Telephone Encounter (Signed)
Pt is requesting CT results & can be reached at [phone].  Pt states he was given ultrasound results yesterday.   Satira Anis

## 2013-12-11 NOTE — Telephone Encounter (Signed)
Per SN: good news.. No pulm nodule seen, no adenopathy. Shows COPD and scarring Recs he needs to stop smoking  i called pt and made aware. Nothing further needed

## 2013-12-12 LAB — CBC CANCER CENTER
BASOS PCT: 1 %
Basophil #: 0.1 x10 3/mm (ref 0.0–0.1)
EOS ABS: 0.2 x10 3/mm (ref 0.0–0.7)
EOS PCT: 2.3 %
HCT: 53 % — AB (ref 40.0–52.0)
HGB: 18.1 g/dL — ABNORMAL HIGH (ref 13.0–18.0)
LYMPHS ABS: 1.9 x10 3/mm (ref 1.0–3.6)
Lymphocyte %: 19.8 %
MCH: 33.6 pg (ref 26.0–34.0)
MCHC: 34.2 g/dL (ref 32.0–36.0)
MCV: 98 fL (ref 80–100)
MONO ABS: 0.6 x10 3/mm (ref 0.2–1.0)
Monocyte %: 6.6 %
NEUTROS ABS: 6.9 x10 3/mm — AB (ref 1.4–6.5)
NEUTROS PCT: 70.3 %
PLATELETS: 216 x10 3/mm (ref 150–440)
RBC: 5.39 10*6/uL (ref 4.40–5.90)
RDW: 13.4 % (ref 11.5–14.5)
WBC: 9.8 x10 3/mm (ref 3.8–10.6)

## 2013-12-30 ENCOUNTER — Ambulatory Visit: Payer: Self-pay | Admitting: Oncology

## 2014-01-23 LAB — CBC CANCER CENTER
Basophil #: 0.1 x10 3/mm (ref 0.0–0.1)
Basophil %: 1.2 %
EOS ABS: 0.2 x10 3/mm (ref 0.0–0.7)
EOS PCT: 2.3 %
HCT: 50.7 % (ref 40.0–52.0)
HGB: 17.3 g/dL (ref 13.0–18.0)
LYMPHS PCT: 22.6 %
Lymphocyte #: 2.2 x10 3/mm (ref 1.0–3.6)
MCH: 33 pg (ref 26.0–34.0)
MCHC: 34.1 g/dL (ref 32.0–36.0)
MCV: 97 fL (ref 80–100)
MONO ABS: 0.6 x10 3/mm (ref 0.2–1.0)
Monocyte %: 6.3 %
NEUTROS PCT: 67.6 %
Neutrophil #: 6.4 x10 3/mm (ref 1.4–6.5)
PLATELETS: 243 x10 3/mm (ref 150–440)
RBC: 5.23 10*6/uL (ref 4.40–5.90)
RDW: 13.4 % (ref 11.5–14.5)
WBC: 9.5 x10 3/mm (ref 3.8–10.6)

## 2014-01-30 ENCOUNTER — Ambulatory Visit: Payer: Self-pay | Admitting: Oncology

## 2014-03-08 ENCOUNTER — Ambulatory Visit: Payer: Self-pay | Admitting: Oncology

## 2014-03-11 LAB — CBC CANCER CENTER
BASOS PCT: 0.7 %
Basophil #: 0.1 x10 3/mm (ref 0.0–0.1)
Eosinophil #: 0.1 x10 3/mm (ref 0.0–0.7)
Eosinophil %: 1.4 %
HCT: 50 % (ref 40.0–52.0)
HGB: 17 g/dL (ref 13.0–18.0)
Lymphocyte #: 1.7 x10 3/mm (ref 1.0–3.6)
Lymphocyte %: 18.1 %
MCH: 31.7 pg (ref 26.0–34.0)
MCHC: 33.9 g/dL (ref 32.0–36.0)
MCV: 94 fL (ref 80–100)
MONOS PCT: 7.1 %
Monocyte #: 0.7 x10 3/mm (ref 0.2–1.0)
NEUTROS PCT: 72.7 %
Neutrophil #: 6.8 x10 3/mm — ABNORMAL HIGH (ref 1.4–6.5)
Platelet: 236 x10 3/mm (ref 150–440)
RBC: 5.35 10*6/uL (ref 4.40–5.90)
RDW: 13.7 % (ref 11.5–14.5)
WBC: 9.3 x10 3/mm (ref 3.8–10.6)

## 2014-03-15 DIAGNOSIS — M25519 Pain in unspecified shoulder: Secondary | ICD-10-CM | POA: Insufficient documentation

## 2014-03-15 DIAGNOSIS — M7542 Impingement syndrome of left shoulder: Secondary | ICD-10-CM | POA: Insufficient documentation

## 2014-03-27 ENCOUNTER — Other Ambulatory Visit: Payer: Self-pay

## 2014-03-27 MED ORDER — MOMETASONE FURO-FORMOTEROL FUM 200-5 MCG/ACT IN AERO: 2.0000 | INHALATION_SPRAY | Freq: Two times a day (BID) | RESPIRATORY_TRACT | Status: DC

## 2014-04-01 ENCOUNTER — Ambulatory Visit: Payer: Self-pay | Admitting: Oncology

## 2014-04-22 LAB — CBC CANCER CENTER
BASOS ABS: 0.1 x10 3/mm (ref 0.0–0.1)
BASOS PCT: 0.7 %
EOS ABS: 0.2 x10 3/mm (ref 0.0–0.7)
EOS PCT: 2.2 %
HCT: 50.5 % (ref 40.0–52.0)
HGB: 17.2 g/dL (ref 13.0–18.0)
LYMPHS ABS: 2.1 x10 3/mm (ref 1.0–3.6)
LYMPHS PCT: 21.4 %
MCH: 31.9 pg (ref 26.0–34.0)
MCHC: 34.1 g/dL (ref 32.0–36.0)
MCV: 94 fL (ref 80–100)
MONO ABS: 0.5 x10 3/mm (ref 0.2–1.0)
Monocyte %: 5.1 %
NEUTROS PCT: 70.6 %
Neutrophil #: 6.9 x10 3/mm — ABNORMAL HIGH (ref 1.4–6.5)
Platelet: 197 x10 3/mm (ref 150–440)
RBC: 5.4 10*6/uL (ref 4.40–5.90)
RDW: 14.2 % (ref 11.5–14.5)
WBC: 9.8 x10 3/mm (ref 3.8–10.6)

## 2014-05-01 ENCOUNTER — Ambulatory Visit: Payer: Self-pay | Admitting: Oncology

## 2014-05-18 DIAGNOSIS — R519 Headache, unspecified: Secondary | ICD-10-CM | POA: Insufficient documentation

## 2014-05-18 DIAGNOSIS — R51 Headache: Secondary | ICD-10-CM

## 2014-05-18 DIAGNOSIS — F419 Anxiety disorder, unspecified: Secondary | ICD-10-CM | POA: Insufficient documentation

## 2014-05-18 DIAGNOSIS — I2510 Atherosclerotic heart disease of native coronary artery without angina pectoris: Secondary | ICD-10-CM | POA: Insufficient documentation

## 2014-05-18 DIAGNOSIS — G8929 Other chronic pain: Secondary | ICD-10-CM | POA: Insufficient documentation

## 2014-06-03 ENCOUNTER — Ambulatory Visit: Payer: Self-pay | Admitting: Oncology

## 2014-06-03 LAB — CBC CANCER CENTER
BASOS ABS: 0.2 x10 3/mm — AB (ref 0.0–0.1)
Basophil %: 1.2 %
Eosinophil #: 0.2 x10 3/mm (ref 0.0–0.7)
Eosinophil %: 2 %
HCT: 52 % (ref 40.0–52.0)
HGB: 17 g/dL (ref 13.0–18.0)
LYMPHS ABS: 2.2 x10 3/mm (ref 1.0–3.6)
LYMPHS PCT: 17.8 %
MCH: 31.6 pg (ref 26.0–34.0)
MCHC: 32.8 g/dL (ref 32.0–36.0)
MCV: 97 fL (ref 80–100)
Monocyte #: 0.8 x10 3/mm (ref 0.2–1.0)
Monocyte %: 6.7 %
NEUTROS PCT: 72.3 %
Neutrophil #: 9.1 x10 3/mm — ABNORMAL HIGH (ref 1.4–6.5)
Platelet: 243 x10 3/mm (ref 150–440)
RBC: 5.39 10*6/uL (ref 4.40–5.90)
RDW: 17 % — AB (ref 11.5–14.5)
WBC: 12.6 x10 3/mm — ABNORMAL HIGH (ref 3.8–10.6)

## 2014-06-04 DIAGNOSIS — R339 Retention of urine, unspecified: Secondary | ICD-10-CM | POA: Insufficient documentation

## 2014-06-19 ENCOUNTER — Ambulatory Visit: Payer: Self-pay | Admitting: Neurology

## 2014-06-24 ENCOUNTER — Ambulatory Visit: Payer: Self-pay | Admitting: Neurology

## 2014-07-02 ENCOUNTER — Ambulatory Visit: Payer: Self-pay | Admitting: Oncology

## 2014-07-15 LAB — CANCER CENTER HEMOGLOBIN: HGB: 16.6 g/dL (ref 13.0–18.0)

## 2014-08-01 ENCOUNTER — Ambulatory Visit: Payer: Self-pay | Admitting: Oncology

## 2014-08-12 DIAGNOSIS — G43109 Migraine with aura, not intractable, without status migrainosus: Secondary | ICD-10-CM | POA: Insufficient documentation

## 2014-08-26 LAB — CANCER CENTER HEMOGLOBIN: HGB: 16.7 g/dL (ref 13.0–18.0)

## 2014-09-01 ENCOUNTER — Ambulatory Visit: Payer: Self-pay | Admitting: Oncology

## 2014-10-07 ENCOUNTER — Ambulatory Visit: Payer: Self-pay | Admitting: Oncology

## 2014-10-07 LAB — CANCER CENTER HEMOGLOBIN: HGB: 17.1 g/dL (ref 13.0–18.0)

## 2014-11-01 ENCOUNTER — Ambulatory Visit: Payer: Self-pay | Admitting: Oncology

## 2014-11-18 LAB — CANCER CENTER HEMOGLOBIN: HGB: 17.1 g/dL (ref 13.0–18.0)

## 2014-11-19 ENCOUNTER — Other Ambulatory Visit: Payer: Self-pay

## 2014-11-19 MED ORDER — MOMETASONE FURO-FORMOTEROL FUM 200-5 MCG/ACT IN AERO: 2.0000 | INHALATION_SPRAY | Freq: Two times a day (BID) | RESPIRATORY_TRACT | Status: DC

## 2014-12-02 ENCOUNTER — Ambulatory Visit: Payer: Self-pay | Admitting: Oncology

## 2014-12-10 DIAGNOSIS — E782 Mixed hyperlipidemia: Secondary | ICD-10-CM | POA: Insufficient documentation

## 2014-12-12 DIAGNOSIS — I2510 Atherosclerotic heart disease of native coronary artery without angina pectoris: Secondary | ICD-10-CM | POA: Insufficient documentation

## 2014-12-12 DIAGNOSIS — I779 Disorder of arteries and arterioles, unspecified: Secondary | ICD-10-CM | POA: Insufficient documentation

## 2014-12-12 DIAGNOSIS — R42 Dizziness and giddiness: Secondary | ICD-10-CM | POA: Insufficient documentation

## 2014-12-12 DIAGNOSIS — I739 Peripheral vascular disease, unspecified: Secondary | ICD-10-CM

## 2014-12-26 ENCOUNTER — Ambulatory Visit: Payer: Self-pay | Admitting: Gastroenterology

## 2014-12-26 ENCOUNTER — Other Ambulatory Visit: Payer: Self-pay

## 2014-12-30 ENCOUNTER — Telehealth: Payer: Self-pay | Admitting: Pulmonary Disease

## 2014-12-30 MED ORDER — MOMETASONE FURO-FORMOTEROL FUM 200-5 MCG/ACT IN AERO
2.0000 | INHALATION_SPRAY | Freq: Two times a day (BID) | RESPIRATORY_TRACT | Status: DC
Start: 2014-12-30 — End: 2015-05-15

## 2014-12-30 NOTE — Telephone Encounter (Signed)
Called made pt aware RX sent in. Nothing further needed

## 2014-12-31 ENCOUNTER — Ambulatory Visit
Admit: 2014-12-31 | Disposition: A | Discharge status: Home / Self Care | Payer: Self-pay | Attending: Oncology | Admitting: Oncology

## 2015-01-16 ENCOUNTER — Ambulatory Visit: Payer: Self-pay | Admitting: Gastroenterology

## 2015-01-31 ENCOUNTER — Ambulatory Visit
Admit: 2015-01-31 | Disposition: A | Discharge status: Home / Self Care | Payer: Self-pay | Attending: Oncology | Admitting: Oncology

## 2015-02-11 LAB — HEMOGLOBIN: HGB: 16.4 g/dL (ref 13.0–18.0)

## 2015-02-21 NOTE — Op Note (Signed)
PATIENT NAME:  Paul Carpenter, KETTLES MR#:  [id number] DATE OF BIRTH:  12-25-1955  DATE OF PROCEDURE:  01/03/2013  PREOPERATIVE DIAGNOSIS: Left chronic maxillary sinusitis.   POSTOPERATIVE DIAGNOSIS: Left chronic maxillary sinusitis.   PROCEDURES PERFORMED:  1.  Endoscopic left maxillary antrostomy. 2.  Image-guided sinus surgery.   SURGEON: Carloyn Manner, M.D.   ANESTHESIA: General endotracheal anesthesia.   ESTIMATED BLOOD LOSS: Less than 10 mL.   IV FLUIDS: Please see anesthesia record.   COMPLICATIONS: None.   DRAIN/STENT PLACEMENTS: Xeroform.  SPECIMENS:  None.  INDICATIONS FOR PROCEDURE: The patient is a 59 year old male with history of chronic sinusitis resistant to medical management and found to have air-fluid level that has been persistent in his left maxillary sinus.   OPERATIVE FINDINGS: Accessory ostia with significant amount of inspissated mucus filling the left maxillary sinus.   DESCRIPTION OF PROCEDURE: After the patient was identified in holding, benefits and risks of the procedure were discussed and consent was reviewed, the patient was taken to the operating room and placed in the supine position. General endotracheal anesthesia was induced. The patient was rotated 90 degrees. The Stryker image-guided sinus navigation system was set up and calibrated in normal fashion with acceptable error of 0.8 mm. At this time, a 0 degree endoscope was brought into the field, the patient was prepped and draped in sterile fashion, and visualization of the patient's left nasal cavity was made. This demonstrated previous septoplasty with a small septal perforation as well as reduced inferior turbinates. This also revealed a large accessory ostia, of the left maxillary sinus, inferiorly with a large amount of mucus emanating from it. At this time, a freer elevator was used to medialize the middle turbinate, on the left. Image-guided sinus suction was brought into the field and care was  taken to ensure proper positioning. At this time, the curved ball tip probe was used to infracture the uncinate process. This was removed with a combination of Diego microdebrider as well as pediatric backbiter forceps and straight through cut. At this time, the large natural ostia of the maxillary sinus was connected to the accessory ostia through use of the ball tip probe. Diego microdebrider was used to enlarge this posteriorly, anteriorly and inferiorly for a widely patent maxillary antrostomy. At this time, visualization of the maxillary sinus revealed some inspissated mucus. This was removed with the Stryker image-guided straight suction. Hemostasis was achieved with topical Afrin. The patient's nasal cavity was copiously irrigated with sterile saline and Xeroform was placed just lateral to the middle turbinate. At this time, care of the patient was transferred to anesthesia where the patient tolerated the procedure well. ____________________________ Jerene Bears, MD ccv:sb D: 01/03/2013 08:27:08 ET T: 01/03/2013 08:38:27 ET JOB#: [id number]  cc: Jerene Bears, MD, <Dictator> Jerene Bears MD ELECTRONICALLY SIGNED 01/11/2013 16:18

## 2015-02-23 NOTE — Op Note (Signed)
PATIENT NAME:  Paul Carpenter, Paul Carpenter MR#:  [id number] DATE OF BIRTH:  July 11, 1956  DATE OF PROCEDURE:  02/08/2012  PREOPERATIVE DIAGNOSIS: Cervical spondylosis.   POSTOPERATIVE DIAGNOSIS: Cervical spondylosis.   PROCEDURES:  1. Anterior cervical diskectomy and fusion, C3-C4 with removal of posterior osteophytes.  2. Anterior cervical diskectomy and fusion with removal of posterior osteophytes, C6-C7.  3. Insertion of interbody device, C3-C4.  4. Insertion of interbody device, T6-R4.  5. Application of anterior plate, C3-C4.  6. Application of anterior plate, C6-C7.   SURGEON: Alysia Penna. Shaneal Barasch, MD  ASSISTANT: None.   ANESTHESIA: General.   ESTIMATED BLOOD LOSS: 100 mL.   REPLACEMENT: 1100 mL of crystalloid.   DRAINS: One Hemovac.   COMPLICATIONS: None.   IMPLANTS USED: Zimmer spine trabecular metal 11 x 14 x 7 angled at C6-C7, 11 x 14 x 6 angled at C3-C4, Trinica plates with 14 mm screws, CopiOs bone void filler.   BRIEF CLINICAL NOTE AND PATHOLOGY: The patient had persistent neck pain with radicular symptoms. Work-up revealed evidence of multilevel change but most significant at the involved levels and those corresponded with this clinical findings. Options, risks, and benefits were discussed in detail including, not limited to, infection, nonunion, malunion, persistence of problem, need for further surgery. Patient understands and wishes to proceed. At time of the procedure there was very significant degenerative change and stenosis.   DESCRIPTION OF PROCEDURE: Adequate general anesthesia, supine position, routine prepping and draping. Prior to prepping and draping the appropriate levels were marked on the skin. The shoulder positioner had been used.   The C6-C7 disk was approached first with a left-sided incision. The interval was developed down carefully to the anterior cervical spine retracting the trachea and esophagus to the right side. Self-retaining retractors were placed.  Distraction pins were then placed using fluoroscopic guidance and the disk space was distracted. At this point the microscope was brought into the field. The annulus was excised and diskectomy was performed. Posterior osteophytes were removed with the Kerrison rongeur. The incision was irrigated throughout the procedure. Lateral decompression was performed. Endplate burring was performed. Sizing was then performed and the size 7 fit nicely. The rasp was used.   The incision again thoroughly irrigated. Small amount of FloSeal was placed. The trabecular metal implant was then inserted and seated very nicely.   Anterior plate was then applied over CopiOs. This was done with AP and lateral fluoroscopic guidance. AP and lateral fluroscopy check position of the plate and screws. The locking mechanisms were engaged.   This incision was thoroughly irrigated. The sub-Q was closed with 2-0 Vicryl and the platysma was approximated with the same. 4-0 subcuticular Vicryl used to close the skin. A Hemovac drain had been placed.   Attention then turned to C3-C4 where the same procedure was performed. Posterior osteophytes were removed. The 6 mm device fit best at this level and it was inserted in routine fashion again after posterior decompression with the Kerrison rongeur, the endplates were adequately decorticated. After the implant was inserted AP and lateral views showed good positioning and the anterior plate was applied. It seated very nicely. Screws were secure. Locking mechanisms engaged.   Hemostasis was good.   AP and lateral fluoroscopic views showed good positioning of both plates.   The incisions were thoroughly irrigated. Sub-Q closed with 2-0 Vicryl after the platysma was approximated with the same. Sub-Q Vicryl used in the skin. Skin glue was used. Soft sterile dressing was applied. Sponge and needle counts  reported as correct prior to and after wound closure. Patient was awakened, taken to the  postanesthesia care unit having tolerated procedure well.    ____________________________ Alysia Penna. Mauri Pole, MD jcc:cms D: 02/08/2012 12:02:21 ET T: 02/08/2012 13:20:57 ET JOB#: [id number]  cc: Alysia Penna. Mauri Pole, MD, <Dictator> Alysia Penna Camyla Camposano MD ELECTRONICALLY SIGNED 02/15/2012 12:56

## 2015-03-12 ENCOUNTER — Other Ambulatory Visit: Payer: Self-pay | Admitting: Family Medicine

## 2015-03-12 DIAGNOSIS — Z720 Tobacco use: Secondary | ICD-10-CM

## 2015-03-13 ENCOUNTER — Other Ambulatory Visit: Payer: Self-pay | Admitting: Family Medicine

## 2015-03-14 ENCOUNTER — Ambulatory Visit
Admission: RE | Admit: 2015-03-14 | Discharge: 2015-03-14 | Disposition: A | Discharge status: Home / Self Care | Payer: BLUE CROSS/BLUE SHIELD | Source: Ambulatory Visit | Attending: Family Medicine | Admitting: Family Medicine

## 2015-03-14 ENCOUNTER — Encounter: Payer: Self-pay | Admitting: Family Medicine

## 2015-03-14 ENCOUNTER — Inpatient Hospital Stay: Payer: BLUE CROSS/BLUE SHIELD | Attending: Family Medicine | Admitting: Family Medicine

## 2015-03-14 DIAGNOSIS — F17200 Nicotine dependence, unspecified, uncomplicated: Secondary | ICD-10-CM | POA: Diagnosis present

## 2015-03-14 DIAGNOSIS — F17210 Nicotine dependence, cigarettes, uncomplicated: Secondary | ICD-10-CM | POA: Insufficient documentation

## 2015-03-14 DIAGNOSIS — Z122 Encounter for screening for malignant neoplasm of respiratory organs: Secondary | ICD-10-CM

## 2015-03-14 DIAGNOSIS — I2510 Atherosclerotic heart disease of native coronary artery without angina pectoris: Secondary | ICD-10-CM | POA: Diagnosis not present

## 2015-03-14 DIAGNOSIS — J439 Emphysema, unspecified: Secondary | ICD-10-CM | POA: Insufficient documentation

## 2015-03-14 DIAGNOSIS — D751 Secondary polycythemia: Secondary | ICD-10-CM | POA: Insufficient documentation

## 2015-03-14 DIAGNOSIS — Z720 Tobacco use: Secondary | ICD-10-CM

## 2015-03-14 NOTE — Progress Notes (Signed)
In accordance with CMS guidelines, patient has meet eligibility criteria including age, absence of signs or symptoms of lung cancer, the specific calculation of cigarette smoking pack-years was 112.5 years and is a current smoker.   A shared decision-making session was conducted prior to the performance of CT scan. This includes one or more decision aids, includes benefits and harms of screening, follow-up diagnostic testing, over-diagnosis, false positive rate, and total radiation exposure.  Counseling on the importance of adherence to annual lung cancer LDCT screening, impact of co-morbidities, and ability or willingness to undergo diagnosis and treatment is imperative for compliance of the program.  Counseling on the importance of continued smoking cessation for former smokers; the importance of smoking cessation for current smokers and information about tobacco cessation interventions have been given to patient including the [place] at [facility], [address] quit [place], as well as [place] specific smoking cessation programs.  Written order for lung cancer screening with LDCT has been given to the patient and any and all questions have been answered to the best of my abilities.   Yearly follow up will be scheduled by Burgess Estelle, Thoracic Navigator.

## 2015-03-17 ENCOUNTER — Telehealth: Payer: Self-pay | Admitting: *Deleted

## 2015-03-17 NOTE — Telephone Encounter (Signed)
Notified patient of LDCT lung cancer screening results of [facility] [place]  2. Finding with recommendation for 12 month follow up imaging. Also notified of incidental finding of COPD and coronary artery atherosclerotic changes. Will forward message to requesting provider as well as patient's PCP and cardiologist.

## 2015-03-24 ENCOUNTER — Other Ambulatory Visit: Payer: Self-pay | Admitting: Oncology

## 2015-03-24 DIAGNOSIS — D751 Secondary polycythemia: Secondary | ICD-10-CM | POA: Insufficient documentation

## 2015-03-25 ENCOUNTER — Inpatient Hospital Stay: Payer: BLUE CROSS/BLUE SHIELD

## 2015-03-25 ENCOUNTER — Other Ambulatory Visit: Payer: Self-pay

## 2015-03-25 VITALS — BP 152/89 | HR 84 | Resp 20

## 2015-03-25 DIAGNOSIS — D751 Secondary polycythemia: Secondary | ICD-10-CM | POA: Diagnosis not present

## 2015-03-25 DIAGNOSIS — F17210 Nicotine dependence, cigarettes, uncomplicated: Secondary | ICD-10-CM | POA: Diagnosis not present

## 2015-03-25 DIAGNOSIS — Z720 Tobacco use: Secondary | ICD-10-CM | POA: Diagnosis present

## 2015-03-25 LAB — HEMOGLOBIN: Hemoglobin: 17.6 g/dL (ref 13.0–18.0)

## 2015-05-06 ENCOUNTER — Encounter: Payer: Self-pay | Admitting: Oncology

## 2015-05-06 ENCOUNTER — Other Ambulatory Visit: Payer: Self-pay

## 2015-05-06 ENCOUNTER — Inpatient Hospital Stay (HOSPITAL_BASED_OUTPATIENT_CLINIC_OR_DEPARTMENT_OTHER): Payer: BLUE CROSS/BLUE SHIELD | Admitting: Oncology

## 2015-05-06 ENCOUNTER — Inpatient Hospital Stay: Payer: BLUE CROSS/BLUE SHIELD

## 2015-05-06 ENCOUNTER — Inpatient Hospital Stay: Payer: BLUE CROSS/BLUE SHIELD | Attending: Oncology

## 2015-05-06 VITALS — BP 156/94 | HR 85 | Temp 97.4°F | Resp 20 | Wt 206.3 lb

## 2015-05-06 DIAGNOSIS — F17210 Nicotine dependence, cigarettes, uncomplicated: Secondary | ICD-10-CM | POA: Insufficient documentation

## 2015-05-06 DIAGNOSIS — I7300 Raynaud's syndrome without gangrene: Secondary | ICD-10-CM | POA: Insufficient documentation

## 2015-05-06 DIAGNOSIS — E785 Hyperlipidemia, unspecified: Secondary | ICD-10-CM | POA: Diagnosis not present

## 2015-05-06 DIAGNOSIS — I2510 Atherosclerotic heart disease of native coronary artery without angina pectoris: Secondary | ICD-10-CM

## 2015-05-06 DIAGNOSIS — Z79899 Other long term (current) drug therapy: Secondary | ICD-10-CM

## 2015-05-06 DIAGNOSIS — D751 Secondary polycythemia: Secondary | ICD-10-CM

## 2015-05-06 DIAGNOSIS — I10 Essential (primary) hypertension: Secondary | ICD-10-CM | POA: Insufficient documentation

## 2015-05-06 LAB — HEMOGLOBIN: Hemoglobin: 15.9 g/dL (ref 13.0–18.0)

## 2015-05-14 ENCOUNTER — Telehealth: Payer: Self-pay | Admitting: Pulmonary Disease

## 2015-05-14 NOTE — Telephone Encounter (Signed)
LMTCB

## 2015-05-14 NOTE — Progress Notes (Signed)
[place]  [phone] [phone]  ID: Paul Carpenter OB: 03/07/1956  MR#: [id number]  PJK#:[id number]  Patient Care Team: Juanito Doom, MD as PCP - General (Internal Medicine)  CHIEF COMPLAINT:  Chief Complaint  Patient presents with  . Follow-up    polycythemia    INTERVAL HISTORY: Patient returns to clinic today for repeat laboratory work, further evaluation, and continuation of phlebotomy.  He continues to feel well and remains asymptomatic.  He continues to smoke heavily. He denies any interest at this time in [place].  He has no neurologic complaints. He denies any recent fevers or illnesses. He has no chest pain or shortness of breath. He denies any nausea, vomiting, constipation, or diarrhea. Patient feels at his baseline and offers no specific complaints today.   REVIEW OF SYSTEMS:   Review of Systems  Constitutional: Negative.   Respiratory: Negative.   Gastrointestinal: Negative.     As per HPI. Otherwise, a complete review of systems is negatve.  PAST MEDICAL HISTORY: Past Medical History  Diagnosis Date  . Hypertension   . OSA (obstructive sleep apnea)     has CPAP but does not use  . Hyperlipidemia   . Raynaud disease   . Sleep apnea   . Migraines     PAST SURGICAL HISTORY: Past Surgical History  Procedure Laterality Date  . Cervical discectomy    . Nasal sinus surgery      x 2     FAMILY HISTORY Family History  Problem Relation Age of Onset  . Heart disease Paternal Grandmother   . Heart disease Father   . Heart attack Maternal Grandfather 52  . Prostate cancer Father        ADVANCED DIRECTIVES:    HEALTH MAINTENANCE: History  Substance Use Topics  . Smoking status: Current Every Day Smoker -- 2.50 packs/day for 45 years    Types: Cigarettes  . Smokeless tobacco: Not on file  . Alcohol Use: 1.2 oz/week    2 Standard drinks or equivalent per week     Colonoscopy:  PAP:  Bone  density:  Lipid panel:  Allergies  Allergen Reactions  . Aspirin Other (See Comments)  . Lisinopril Other (See Comments)  . Varenicline Other (See Comments)    Current Outpatient Prescriptions  Medication Sig Dispense Refill  . albuterol (PROAIR HFA) 108 (90 BASE) MCG/ACT inhaler Inhale into the lungs.    Marland Kitchen atorvastatin (LIPITOR) 40 MG tablet Take 1 tablet by mouth daily.    Marland Kitchen azelastine (ASTELIN) 0.1 % nasal spray 2 sprays by Each Nare route daily as needed.     . fluticasone (FLONASE) 50 MCG/ACT nasal spray Place 2 sprays into both nostrils daily. 16 g 2  . hydrochlorothiazide (MICROZIDE) 12.5 MG capsule Take 1 capsule by mouth daily.    . mirtazapine (REMERON) 15 MG tablet Take 1 tablet by mouth at bedtime as needed.    . mometasone-formoterol (DULERA) 200-5 MCG/ACT AERO Inhale 2 puffs into the lungs 2 (two) times daily. 1 Inhaler 3  . pantoprazole (PROTONIX) 20 MG tablet Take 1 tablet by mouth daily.    . propranolol ER (INDERAL LA) 80 MG 24 hr capsule Take 80 mg by mouth daily.  5  . Spacer/Aero-Holding Chambers (AEROCHAMBER PLUS) inhaler Use as instructed 1 each 0  . testosterone enanthate (DELATESTRYL) 200 MG/ML injection Inject 1.3 mg into the muscle every 14 (fourteen) days. For IM use only    . traZODone (DESYREL) 50 MG tablet  TAKE 1-2 TABLETS (50-100 MG TOTAL) BY MOUTH NIGHTLY AS NEEDED FOR SLEEP.  11  . varenicline (CHANTIX CONTINUING MONTH PAK) 1 MG tablet .5 tablet X3days, then .5 tablet BID X3 days, then 1 po bid 60 tablet 0   No current facility-administered medications for this visit.    OBJECTIVE: Filed Vitals:   05/06/15 1426  BP: 156/94  Pulse: 85  Temp: 97.4 F (36.3 C)  Resp: 20     Body mass index is 27.98 kg/(m^2).    ECOG FS:0 - Asymptomatic  General: Well-developed, well-nourished, no acute distress. Eyes: anicteric sclera. Lungs: Clear to auscultation bilaterally. Heart: Regular rate and rhythm. No rubs, murmurs, or gallops. Abdomen: Soft,  nontender, nondistended. No organomegaly noted, normoactive bowel sounds. Musculoskeletal: No edema, cyanosis, or clubbing. Neuro: Alert, answering all questions appropriately. Cranial nerves grossly intact. Skin: No rashes or petechiae noted. Psych: Normal affect.   LAB RESULTS:  Lab Results  Component Value Date   NA 136 12/25/2012   K 4.0 12/25/2012   CL 102 12/25/2012   CO2 31 12/25/2012   GLUCOSE 86 12/25/2012   BUN 10 12/25/2012   CREATININE 0.88 12/25/2012   CALCIUM 8.9 12/25/2012   GFRNONAA >60 12/25/2012   GFRAA >60 12/25/2012    Lab Results  Component Value Date   WBC 12.6* 06/03/2014   NEUTROABS 9.1* 06/03/2014   HGB 15.9 05/06/2015   HCT 52.0 06/03/2014   MCV 97 06/03/2014   PLT 243 06/03/2014     STUDIES: CT screening, Mar 14, 2015.  1. Lung-RADS Category 2, benign appearance or behavior. Continue annual screening with low-dose chest CT without contrast in 12 months 2. Diffuse bronchial wall thickening with emphysema, as above; imaging findings suggestive of underlying COPD. 3. Atherosclerotic disease including LAD and RCA Coronary artery calcification.  ASSESSMENT: Polycythemia.  PLAN:    1.  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  Patient's hemoglobin is 15.9.  His goal is <17.0. He does not require phlebotomy today. Return to clinic in 3 months with repeat laboratory work and further evaluation.  Previously the remainder of his laboratory work, including JAK 2 mutation, was either negative or within normal limits.  2.  Tobacco abuse. CT screening results as above. Repeat in one year.  Patient expressed understanding and was in agreement with this plan. He also understands that He can call clinic at any time with any questions, concerns, or complaints.    [name], MD   05/14/2015 11:32 AM

## 2015-05-15 MED ORDER — MOMETASONE FURO-FORMOTEROL FUM 200-5 MCG/ACT IN AERO: 2.0000 | INHALATION_SPRAY | Freq: Two times a day (BID) | RESPIRATORY_TRACT | Status: DC

## 2015-05-15 NOTE — Telephone Encounter (Signed)
Spoke with pt. Aware refill for dulera sent in and to keep pending appt. Nothing further needed

## 2015-05-20 ENCOUNTER — Encounter: Payer: Self-pay | Admitting: Internal Medicine

## 2015-05-20 ENCOUNTER — Ambulatory Visit (INDEPENDENT_AMBULATORY_CARE_PROVIDER_SITE_OTHER): Payer: BLUE CROSS/BLUE SHIELD | Admitting: Internal Medicine

## 2015-05-20 VITALS — BP 132/88 | HR 86 | Temp 97.9°F | Ht 72.0 in | Wt 204.0 lb

## 2015-05-20 DIAGNOSIS — R059 Cough, unspecified: Secondary | ICD-10-CM

## 2015-05-20 DIAGNOSIS — Z720 Tobacco use: Secondary | ICD-10-CM | POA: Diagnosis not present

## 2015-05-20 DIAGNOSIS — J441 Chronic obstructive pulmonary disease with (acute) exacerbation: Secondary | ICD-10-CM | POA: Diagnosis not present

## 2015-05-20 DIAGNOSIS — R062 Wheezing: Secondary | ICD-10-CM | POA: Diagnosis not present

## 2015-05-20 DIAGNOSIS — R05 Cough: Secondary | ICD-10-CM

## 2015-05-20 MED ORDER — IPRATROPIUM-ALBUTEROL 0.5-2.5 (3) MG/3ML IN SOLN
3.0000 mL | Freq: Once | RESPIRATORY_TRACT | Status: AC
  Administered 2015-05-20: 3 mL via RESPIRATORY_TRACT

## 2015-05-20 MED ORDER — AZITHROMYCIN 250 MG PO TABS: ORAL_TABLET | ORAL | Status: DC

## 2015-05-20 MED ORDER — PREDNISONE 10 MG PO TABS: ORAL_TABLET | ORAL | Status: DC

## 2015-05-20 NOTE — Assessment & Plan Note (Signed)
He notes increased dyspnea and cough since last visit. This is due to COPD and ongoing tobacco use, and probable mild COPD exacerbation.   His pulmonary nodule is followed by the lung cancer screening program at [facility].   Plan: -Predisone '20mg'$  x 5 days, zpak -Continue Dulera -Stop smoking, stop smoking, stop smoking

## 2015-05-20 NOTE — Assessment & Plan Note (Signed)
Tobacco Cessation - Counseling regarding benefits of smoking cessation strategies was provided for more than 12 min. - Educated that at this time smoking- cessation represents the single most important step that patient can take to enhance the length and quality of live. - Educated patient regarding alternatives of behavior interventions, pharmacotherapy including NRT and non-nicotine therapy such, and combinations of both. - Patient at this time will try to quit on his own.

## 2015-05-20 NOTE — Progress Notes (Signed)
MRN# [id number] Paul Carpenter Jul 18, 1956   CC: Chief Complaint  Patient presents with  . Follow-up    BQ patient here for medication refill; Pt is having cough with clear mucus; Pt has also had chest tightness and wheezing. Pt has been using Dulera 2 puffs bid. He would like to discuss changing meds due to cost.      Brief History: Synopsis: Paul Carpenter first saw the [facility] pulmonary clinic in July of 2014 for COPD. Simple spirometry showed a ratio 62%, FEV1 of 2.41 L (60% predicted). He had smoked 2 packs of cigarettes daily since his teenage years and continues to smoke. He has had multiple sinus infections and 2 sinus surgeries in the past.  HPI   05/29/2013 ROV >> Paul Carpenter thinks that his cough is actually worse since last visit. He is also experienced hoarseness since the last visit. He notes sinus congestion and green mucus production frequently. He takes Ultram at night it doesn't particularly making sleepy. He doesn't recall coughing frequently throughout the night but he does cough sometimes when he gets up to go the bathroom. He feels like he has a constant build up in his throat and congestion. He is constantly clearing his throat. Unclear to him that this is secondary to the Spiriva. She stopped taking the Spiriva 3 days ago to see if that would help, but unfortunately he still has the symptoms.  07/03/2013 ROV >> Paul Carpenter says that his cough is not as productive as it was since the last visit. It is improved a little bit. He still has a cough at night. He still has hoarseness. The Ruthe Mannan has been tolerated well but he can't tell that it made a huge difference in his symptoms. He continues to smoke one and a half to 2 packs of cigarettes daily. He does not think the Wellbutrin has helped with this.  12/06/2013 ROV >> Paul Carpenter says that is worse since the last visit. He is coughing a lot at night and during the day. He has has not been to [facility] like we asked. He has  a lot of congestion in his throat. He is having a lot of sinus congestion and drainage. Sometimes this is green in color. He is not currently taking any sinus sprays or over the counter decongestants. He feels more short of breath with exertion and sometimes at night. He occasionally wakes up gasping for air. He does not use his CPAP machine.  He continues to smoke, he is currently smoking 1.5 packs per day. He is on the road a lot and it is hard to not smoke.  He has been having a lot of chest pain in his right chest under his R chest. It feels like a knife.   Events since last clinic visit: Patient presents today for follow-up of his COPD. Since his last visit he has not had any significant urgent care with ED visits. He does state over the last 4-6 weeks he's had increased shortness of breath, increased sputum production, and increased cough. He is still smoking about 2 packs per day, he states that [facility] is financially straining and he would like another inhaler or something similar that is more financially affordable.  He is still having quite a bit of nasal congestion which she states is chronic, and he has not follow-up with ENT.     Medication:   Current Outpatient Rx  Name  Route  Sig  Dispense  Refill  . albuterol (PROAIR HFA)  108 (90 BASE) MCG/ACT inhaler   Inhalation   Inhale into the lungs.         Marland Kitchen atorvastatin (LIPITOR) 40 MG tablet   Oral   Take 1 tablet by mouth daily.         . fluticasone (FLONASE) 50 MCG/ACT nasal spray   Each Nare   Place 2 sprays into both nostrils daily. Patient taking differently: Place 2 sprays into both nostrils as needed.    16 g   2   . hydrochlorothiazide (MICROZIDE) 12.5 MG capsule   Oral   Take 1 capsule by mouth daily.         . mirtazapine (REMERON) 15 MG tablet   Oral   Take 1 tablet by mouth at bedtime as needed.         . mometasone-formoterol (DULERA) 200-5 MCG/ACT AERO   Inhalation   Inhale 2 puffs  into the lungs 2 (two) times daily.   1 Inhaler   3   . pantoprazole (PROTONIX) 20 MG tablet   Oral   Take 1 tablet by mouth daily.         . propranolol ER (INDERAL LA) 80 MG 24 hr capsule   Oral   Take 80 mg by mouth daily.      5   . Spacer/Aero-Holding Chambers (AEROCHAMBER PLUS) inhaler      Use as instructed   1 each   0   . testosterone enanthate (DELATESTRYL) 200 MG/ML injection   Intramuscular   Inject 1.3 mg into the muscle every 14 (fourteen) days. For IM use only         . traZODone (DESYREL) 50 MG tablet      TAKE 1-2 TABLETS (50-100 MG TOTAL) BY MOUTH NIGHTLY AS NEEDED FOR SLEEP.      11      Review of Systems: Gen:  Denies  fever, sweats, chills HEENT: Denies blurred vision, double vision, ear pain, eye pain, hearing loss, nose bleeds, sore throat Cvc:  No dizziness, chest pain or heaviness Resp:   Admits to: Cough, nasal congestion, increased shortness of breath, sputum production Gi: Denies swallowing difficulty, stomach pain, nausea or vomiting, diarrhea, constipation, bowel incontinence Gu:  Denies bladder incontinence, burning urine Ext:   No Joint pain, stiffness or swelling Skin: No skin rash, easy bruising or bleeding or hives Endoc:  No polyuria, polydipsia , polyphagia or weight change Other:  All other systems negative  Allergies:  Aspirin; Lisinopril; and Varenicline  Physical Examination:  VS: BP 132/88 mmHg  Pulse 86  Temp(Src) 97.9 F (36.6 C) (Oral)  Ht 6' (1.829 m)  Wt 204 lb (92.534 kg)  BMI 27.66 kg/m2  SpO2 95%  General Appearance: No distress  HEENT: PERRLA, no ptosis, no other lesions noticed Pulmonary: -Prebronchodilator: Decreased breath sounds at the bilateral bases, upper lobe bilaterally at expiratory wheezes noted -Postbronchodilator: Improved breath sounds at the bilateral bases, resolution of expiratory wheezes  Cardiovascular:  Normal S1,S2.  No m/r/g.     Abdomen:Exam: Benign, Soft, non-tender, No masses   Skin:   warm, no rashes, no ecchymosis  Extremities: normal, no cyanosis, clubbing, warm with normal capillary refill.    (The following images and results were reviewed by Dr. Stevenson Clinch). STUDIES: CT screening, Mar 14, 2015.  1. Lung-RADS Category 2, benign appearance or behavior. Continue annual screening with low-dose chest CT without contrast in 12 months 2. Diffuse bronchial wall thickening with emphysema, as above; imaging findings suggestive of underlying COPD.  3. Atherosclerotic disease including LAD and RCA Coronary artery calcification.  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 This is due to COPD and ongoing tobacco use, and probable mild COPD exacerbation.   His pulmonary nodule is followed by the lung cancer screening program at [facility].   Plan: -Predisone '20mg'$  x 5 days, zpak -Continue Dulera -Stop smoking, stop smoking, stop smoking    Tobacco abuse Tobacco Cessation - Counseling regarding benefits of smoking cessation strategies was provided for more than 12 min. - Educated that at this time smoking- cessation represents the single most important step that patient can take to enhance the length and quality of live. - Educated patient regarding alternatives of behavior interventions, pharmacotherapy including NRT and non-nicotine therapy such, and combinations of both. - Patient at this time will try to quit on his own.    Updated Medication List Outpatient Encounter Prescriptions as of 05/20/2015  Medication Sig  . albuterol (PROAIR HFA) 108 (90 BASE) MCG/ACT inhaler Inhale into the lungs.  Marland Kitchen atorvastatin (LIPITOR) 40 MG tablet Take 1 tablet by mouth daily.  . fluticasone (FLONASE) 50 MCG/ACT nasal spray Place 2 sprays into both nostrils daily. (Patient taking  differently: Place 2 sprays into both nostrils as needed. )  . hydrochlorothiazide (MICROZIDE) 12.5 MG capsule Take 1 capsule by mouth daily.  . mirtazapine (REMERON) 15 MG tablet Take 1 tablet by mouth at bedtime as needed.  . mometasone-formoterol (DULERA) 200-5 MCG/ACT AERO Inhale 2 puffs into the lungs 2 (two) times daily.  . pantoprazole (PROTONIX) 20 MG tablet Take 1 tablet by mouth daily.  . propranolol ER (INDERAL LA) 80 MG 24 hr capsule Take 80 mg by mouth daily.  Marland Kitchen Spacer/Aero-Holding Chambers (AEROCHAMBER PLUS) inhaler Use as instructed  . testosterone enanthate (DELATESTRYL) 200 MG/ML injection Inject 1.3 mg into the muscle every 14 (fourteen) days. For IM use only  . traZODone (DESYREL) 50 MG tablet TAKE 1-2 TABLETS (50-100 MG TOTAL) BY MOUTH NIGHTLY AS NEEDED FOR SLEEP.  . [DISCONTINUED] azelastine (ASTELIN) 0.1 % nasal spray 2 sprays by Each Nare route daily as needed.  . [DISCONTINUED] varenicline (CHANTIX CONTINUING MONTH PAK) 1 MG tablet .5 tablet X3days, then .5 tablet BID X3 days, then 1 po bid (Patient not taking: Reported on 05/20/2015)  . [EXPIRED] ipratropium-albuterol (DUONEB) 0.5-2.5 (3) MG/3ML nebulizer solution 3 mL    No facility-administered encounter medications on file as of 05/20/2015.    Orders for this visit: No orders of the defined types were placed in this encounter.    Thank  you for the visitation and for allowing  [facility] to assist in the care of your patient. Our recommendations are noted above.  Please contact us if we can be of further service.  Vilinda Boehringer, MD [facility] Office Number: [phone]  Cc: Dr. Frazier Richards

## 2015-05-20 NOTE — Patient Instructions (Addendum)
Follow up in 3 months with Dr. Stevenson Clinch - Zpak - use as directed - Prednisone - 20 mg - 1 tab daily x 5 days with breakfast - please cut back and eventually stop smoking.  - Please call your insurance to inquire which drug similar to [facility] is affordable to you.

## 2015-05-20 NOTE — Addendum Note (Signed)
Addended by: Devona Konig on: 05/20/2015 05:13 PM   Modules accepted: Orders

## 2015-05-28 DIAGNOSIS — I10 Essential (primary) hypertension: Secondary | ICD-10-CM | POA: Insufficient documentation

## 2015-07-08 DIAGNOSIS — I493 Ventricular premature depolarization: Secondary | ICD-10-CM | POA: Insufficient documentation

## 2015-07-25 ENCOUNTER — Ambulatory Visit: Payer: BLUE CROSS/BLUE SHIELD | Admitting: Anesthesiology

## 2015-07-25 ENCOUNTER — Encounter
Admission: RE | Disposition: A | Discharge status: Home / Self Care | Payer: Self-pay | Source: Ambulatory Visit | Attending: Gastroenterology

## 2015-07-25 ENCOUNTER — Ambulatory Visit
Admission: RE | Admit: 2015-07-25 | Discharge: 2015-07-25 | Disposition: A | Discharge status: Home / Self Care | Payer: BLUE CROSS/BLUE SHIELD | Source: Ambulatory Visit | Attending: Gastroenterology | Admitting: Gastroenterology

## 2015-07-25 ENCOUNTER — Encounter: Payer: Self-pay | Admitting: *Deleted

## 2015-07-25 DIAGNOSIS — R110 Nausea: Secondary | ICD-10-CM | POA: Diagnosis present

## 2015-07-25 DIAGNOSIS — D124 Benign neoplasm of descending colon: Secondary | ICD-10-CM | POA: Diagnosis not present

## 2015-07-25 DIAGNOSIS — I739 Peripheral vascular disease, unspecified: Secondary | ICD-10-CM | POA: Insufficient documentation

## 2015-07-25 DIAGNOSIS — J449 Chronic obstructive pulmonary disease, unspecified: Secondary | ICD-10-CM | POA: Diagnosis not present

## 2015-07-25 DIAGNOSIS — D127 Benign neoplasm of rectosigmoid junction: Secondary | ICD-10-CM | POA: Insufficient documentation

## 2015-07-25 DIAGNOSIS — Z859 Personal history of malignant neoplasm, unspecified: Secondary | ICD-10-CM | POA: Insufficient documentation

## 2015-07-25 DIAGNOSIS — D120 Benign neoplasm of cecum: Secondary | ICD-10-CM | POA: Diagnosis not present

## 2015-07-25 DIAGNOSIS — Z7951 Long term (current) use of inhaled steroids: Secondary | ICD-10-CM | POA: Diagnosis not present

## 2015-07-25 DIAGNOSIS — G43909 Migraine, unspecified, not intractable, without status migrainosus: Secondary | ICD-10-CM | POA: Insufficient documentation

## 2015-07-25 DIAGNOSIS — I10 Essential (primary) hypertension: Secondary | ICD-10-CM | POA: Diagnosis not present

## 2015-07-25 DIAGNOSIS — K621 Rectal polyp: Secondary | ICD-10-CM | POA: Diagnosis not present

## 2015-07-25 DIAGNOSIS — D122 Benign neoplasm of ascending colon: Secondary | ICD-10-CM | POA: Insufficient documentation

## 2015-07-25 DIAGNOSIS — Z886 Allergy status to analgesic agent status: Secondary | ICD-10-CM | POA: Diagnosis not present

## 2015-07-25 DIAGNOSIS — D125 Benign neoplasm of sigmoid colon: Secondary | ICD-10-CM | POA: Insufficient documentation

## 2015-07-25 DIAGNOSIS — E785 Hyperlipidemia, unspecified: Secondary | ICD-10-CM | POA: Insufficient documentation

## 2015-07-25 DIAGNOSIS — Z79899 Other long term (current) drug therapy: Secondary | ICD-10-CM | POA: Insufficient documentation

## 2015-07-25 DIAGNOSIS — K2210 Ulcer of esophagus without bleeding: Secondary | ICD-10-CM | POA: Diagnosis not present

## 2015-07-25 DIAGNOSIS — I2510 Atherosclerotic heart disease of native coronary artery without angina pectoris: Secondary | ICD-10-CM | POA: Diagnosis not present

## 2015-07-25 DIAGNOSIS — I7300 Raynaud's syndrome without gangrene: Secondary | ICD-10-CM | POA: Insufficient documentation

## 2015-07-25 DIAGNOSIS — G4733 Obstructive sleep apnea (adult) (pediatric): Secondary | ICD-10-CM | POA: Insufficient documentation

## 2015-07-25 DIAGNOSIS — Z888 Allergy status to other drugs, medicaments and biological substances status: Secondary | ICD-10-CM | POA: Insufficient documentation

## 2015-07-25 DIAGNOSIS — K2970 Gastritis, unspecified, without bleeding: Secondary | ICD-10-CM | POA: Insufficient documentation

## 2015-07-25 DIAGNOSIS — Z86010 Personal history of colonic polyps: Secondary | ICD-10-CM | POA: Insufficient documentation

## 2015-07-25 HISTORY — DX: Melena: K92.1

## 2015-07-25 HISTORY — PX: ESOPHAGOGASTRODUODENOSCOPY: SHX5428

## 2015-07-25 HISTORY — DX: Angina pectoris, unspecified: I20.9

## 2015-07-25 HISTORY — DX: Dysphonia: R49.0

## 2015-07-25 HISTORY — PX: COLONOSCOPY: SHX5424

## 2015-07-25 HISTORY — DX: Varicella without complication: B01.9

## 2015-07-25 HISTORY — DX: Raynaud's syndrome without gangrene: I73.00

## 2015-07-25 HISTORY — DX: Atherosclerotic heart disease of native coronary artery without angina pectoris: I25.10

## 2015-07-25 HISTORY — DX: Other forms of stomatitis: K12.1

## 2015-07-25 HISTORY — DX: Unspecified hemorrhoids: K64.9

## 2015-07-25 HISTORY — DX: Chronic obstructive pulmonary disease, unspecified: J44.9

## 2015-07-25 LAB — KOH PREP: KOH PREP: NONE SEEN

## 2015-07-25 SURGERY — COLONOSCOPY
Anesthesia: General

## 2015-07-25 MED ORDER — ALFENTANIL 500 MCG/ML IJ INJ
INJECTION | INTRAMUSCULAR | Status: DC | PRN
  Administered 2015-07-25: 500 ug via INTRAVENOUS

## 2015-07-25 MED ORDER — SODIUM CHLORIDE 0.9 % IV SOLN: INTRAVENOUS | Status: DC

## 2015-07-25 MED ORDER — SODIUM CHLORIDE 0.9 % IV SOLN
INTRAVENOUS | Status: DC
Start: 2015-07-25 — End: 2015-07-25
  Administered 2015-07-25: 1000 mL via INTRAVENOUS
  Administered 2015-07-25: 08:00:00 via INTRAVENOUS

## 2015-07-25 MED ORDER — MIDAZOLAM HCL 2 MG/2ML IJ SOLN
INTRAMUSCULAR | Status: DC | PRN
  Administered 2015-07-25: 1.5 mg via INTRAVENOUS

## 2015-07-25 MED ORDER — PROPOFOL 500 MG/50ML IV EMUL
INTRAVENOUS | Status: DC | PRN
  Administered 2015-07-25: 140 ug/kg/min via INTRAVENOUS

## 2015-07-25 NOTE — Transfer of Care (Signed)
Immediate Anesthesia Transfer of Care Note  Patient: Paul Carpenter  Procedure(s) Performed: Procedure(s): COLONOSCOPY (N/A) ESOPHAGOGASTRODUODENOSCOPY (EGD) (N/A)  Patient Location: PACU and Endoscopy Unit  Anesthesia Type:General  Level of Consciousness: patient cooperative, lethargic and responds to stimulation  Airway & Oxygen Therapy: Patient Spontanous Breathing and Patient connected to nasal cannula oxygen  Post-op Assessment: Report given to RN and Post -op Vital signs reviewed and stable  Post vital signs: Reviewed and stable  Last Vitals:  Filed Vitals:   07/25/15 1017  BP: 126/82  Pulse: 82  Temp: 35.8 C  Resp: 18    Complications: No apparent anesthesia complications

## 2015-07-25 NOTE — Anesthesia Postprocedure Evaluation (Signed)
  Anesthesia Post-op Note  Patient: Paul Carpenter  Procedure(s) Performed: Procedure(s): COLONOSCOPY (N/A) ESOPHAGOGASTRODUODENOSCOPY (EGD) (N/A)  Anesthesia type:General  Patient location: PACU  Post pain: Pain level controlled  Post assessment: Post-op Vital signs reviewed, Patient's Cardiovascular Status Stable, Respiratory Function Stable, Patent Airway and No signs of Nausea or vomiting  Post vital signs: Reviewed and stable  Last Vitals:  Filed Vitals:   07/25/15 1050  BP: 128/64  Pulse:   Temp:   Resp: 20    Level of consciousness: awake, alert  and patient cooperative  Complications: No apparent anesthesia complications

## 2015-07-25 NOTE — Op Note (Addendum)
[facility] Patient Name: Paul Carpenter Procedure Date: 07/25/2015 8:07 AM MRN: [id number] Account #: [id number] Date of Birth: April 06, 1956 Admit Type: Outpatient Age: 59 Room: [facility] ENDO ROOM 3 Gender: Male Note Status: Supervisor Override Procedure:         Upper GI endoscopy Indications:       Nausea Providers:         Lollie Sails, MD Referring MD:      Ocie Cornfield. Ouida Sills, MD (Referring MD) Medicines:         Monitored Anesthesia Care Complications:     No immediate complications. Procedure:         Pre-Anesthesia Assessment:                    - ASA Grade Assessment: III - A patient with severe                     systemic disease.                    After obtaining informed consent, the endoscope was passed                     under direct vision. Throughout the procedure, the                     patient's blood pressure, pulse, and oxygen saturations                     were monitored continuously. The Endoscope was introduced                     through the mouth, and advanced to the third part of                     duodenum. The upper GI endoscopy was accomplished without                     difficulty. The patient tolerated the procedure well. Findings:      LA Grade A (one or more mucosal breaks less than 5 mm, not extending       between tops of 2 mucosal folds) esophagitis with no bleeding was found.       Biopsies were taken with a cold forceps for histology.      Patchy candidiasis was found in the middle third of the esophagus and in       the lower third of the esophagus.      Patchy mild inflammation characterized by congestion (edema) and       erythema was found in the gastric body. Biopsies were taken with a cold       forceps for histology. Biopsies were taken with a cold forceps for       Helicobacter pylori testing.      Patchy mild inflammation characterized by congestion (edema) and       erythema was found in  the duodenal bulb.      The exam of the duodenum was otherwise normal. Impression:        - LA Grade A erosive esophagitis. Biopsied.                    - Gastritis. Biopsied.                    - Duodenitis. Recommendation:    -  Use Protonix (pantoprazole) 40 mg PO BID for 1 month.                    - Use Protonix (pantoprazole) 40 mg PO daily daily.                    - Return to GI clinic in 4 weeks.                    - Await pathology results.                    - Mycelex (clotrimazole) 10 mg lozenge 5x/day for 1 week. Procedure Code(s): --- Professional ---                    [id number], Esophagogastroduodenoscopy, flexible, transoral;                     with biopsy, single or multiple Diagnosis Code(s): --- Professional ---                    530.19, Other esophagitis                    531.90, Gastric ulcer, unspecified as acute or chronic,                     without mention of hemorrhage or perforation, without                     mention of obstruction                    535.50, Unspecified gastritis and gastroduodenitis,                     without mention of hemorrhage                    535.60, Duodenitis, without mention of hemorrhage                    787.02, Nausea alone CPT copyright 2014 American Medical Association. All rights reserved. The codes documented in this report are preliminary and upon coder review may  be revised to meet current compliance requirements. Lollie Sails, MD 07/25/2015 8:57:50 AM This report has been signed electronically. Number of Addenda: 0 Note Initiated On: 07/25/2015 8:07 AM      [facility]

## 2015-07-25 NOTE — H&P (Signed)
Outpatient short stay form Pre-procedure 07/25/2015 8:24 AM Paul Sails MD  Primary Physician: Dr. Frazier Richards  Reason for visit:  EGD and colonoscopy  History of present illness:  Patient is a 59 year old male setting for follow-up colonoscopy as well as some intermittent chronic problems with nausea. His last colonoscopy was 07/18/2012 with a finding of a tubular adenoma over 10 mm in size. He takes no anticoagulation medications or aspirin products. He tolerated his prep well.    Current facility-administered medications:  .  0.9 %  sodium chloride infusion, , Intravenous, Continuous, Paul Sails, MD, Last Rate: 20 mL/hr at 07/25/15 0823, 1,000 mL at 07/25/15 0823 .  0.9 %  sodium chloride infusion, , Intravenous, Continuous, Paul Sails, MD  Prescriptions prior to admission  Medication Sig Dispense Refill Last Dose  . hydrochlorothiazide (MICROZIDE) 12.5 MG capsule Take 1 capsule by mouth daily.   07/25/2015 at 0630  . mometasone-formoterol (DULERA) 200-5 MCG/ACT AERO Inhale 2 puffs into the lungs 2 (two) times daily. 1 Inhaler 3 07/25/2015 at 0630  . pantoprazole (PROTONIX) 20 MG tablet Take 1 tablet by mouth daily.   07/25/2015 at 0630  . propranolol ER (INDERAL LA) 80 MG 24 hr capsule Take 80 mg by mouth daily.  5 07/25/2015 at 0630  . tamsulosin (FLOMAX) 0.4 MG CAPS capsule Take 0.4 mg by mouth.     Marland Kitchen albuterol (PROAIR HFA) 108 (90 BASE) MCG/ACT inhaler Inhale into the lungs.   Taking  . atorvastatin (LIPITOR) 40 MG tablet Take 1 tablet by mouth daily.   Taking  . azithromycin (ZITHROMAX) 250 MG tablet TAKE AS DIRECTED 6 each 0   . fluticasone (FLONASE) 50 MCG/ACT nasal spray Place 2 sprays into both nostrils daily. (Patient taking differently: Place 2 sprays into both nostrils as needed. ) 16 g 2 Taking  . mirtazapine (REMERON) 15 MG tablet Take 1 tablet by mouth at bedtime as needed.   Taking  . predniSONE (DELTASONE) 10 MG tablet Take 2 tablets at breakfast  daily. 10 tablet 0   . Spacer/Aero-Holding Chambers (AEROCHAMBER PLUS) inhaler Use as instructed 1 each 0 Taking  . testosterone enanthate (DELATESTRYL) 200 MG/ML injection Inject 1.3 mg into the muscle every 14 (fourteen) days. For IM use only   Taking  . traZODone (DESYREL) 50 MG tablet TAKE 1-2 TABLETS (50-100 MG TOTAL) BY MOUTH NIGHTLY AS NEEDED FOR SLEEP.  11 Taking     Allergies  Allergen Reactions  . Aspirin Other (See Comments)  . Lisinopril Other (See Comments)  . Varenicline Other (See Comments)     Past Medical History  Diagnosis Date  . Hypertension   . OSA (obstructive sleep apnea)     has CPAP but does not use  . Hyperlipidemia   . Raynaud disease   . Sleep apnea   . Migraines   . Coronary artery disease   . COPD (chronic obstructive pulmonary disease)   . Cancer   . Chicken pox   . Ulcer (traumatic) of oral mucosa   . Hemorrhoids   . Raynaud's disease   . Anginal pain   . Hematochezia   . Hoarseness     Review of systems:      Physical Exam    Heart and lungs: Regular rate and rhythm without rub or gallop, lungs are bilaterally clear    HEENT: Normocephalic atraumatic eyes are anicteric    Other:     Pertinant exam for procedure: Soft mild discomfort palpation in the  bilateral lower quadrants no masses or rebound bowel sounds are positive normoactive.    Planned proceedures: EGD and colonoscopy with indicated procedures I have discussed the risks benefits and complications of procedures to include not limited to bleeding, infection, perforation and the risk of sedation and the patient wishes to proceed.    Paul Sails, MD Gastroenterology 07/25/2015  8:24 AM

## 2015-07-25 NOTE — Op Note (Signed)
[facility] Gastroenterology Patient Name: Paul Carpenter Procedure Date: 07/25/2015 8:07 AM MRN: [id number] Account #: [id number] Date of Birth: Sep 26, 1956 Admit Type: Outpatient Age: 59 Room[facility]OM 3 Gender: Male Note Status: Finalized Procedure:         Colonoscopy Indications:       Personal history of colonic polyps Providers:         Lollie Sails, MD Referring MD:      Ocie Cornfield. Ouida Sills, MD (Referring MD) Medicines:         Monitored Anesthesia Care Complications:     No immediate complications. Procedure:         Pre-Anesthesia Assessment:                    - ASA Grade Assessment: III - A patient with severe                     systemic disease.                    After obtaining informed consent, the colonoscope was                     passed under direct vision. Throughout the procedure, the                     patient's blood pressure, pulse, and oxygen saturations                     were monitored continuously. The Colonoscope was                     introduced through the anus and advanced to the the cecum,                     identified by appendiceal orifice and ileocecal valve. The                     colonoscopy was unusually difficult due to significant                     looping and a tortuous colon. Successful completion of the                     procedure was aided by using manual pressure. Findings:      A 20 mm polyp was found in the cecum. The polyp was sessile. The polyp       was removed with a cold biopsy forceps. The polyp was removed with a       saline injection-lift technique using a hot snare. The polyp was removed       with a piecemeal technique using a hot snare. The polyp was removed with       a cold snare. Resection and retrieval were complete.      A 3 mm polyp was found in the cecum. The polyp was sessile. The polyp       was removed with a cold biopsy forceps. Resection and retrieval were   complete.      A 6 mm polyp was found in the ascending colon. The polyp was sessile.       The polyp was removed with a cold biopsy forceps. The polyp was removed       with a cold snare. Resection and retrieval were complete.  Three sessile polyps were found in the descending colon. The polyps were       2 to 3 mm in size. These polyps were removed with a cold biopsy forceps.       Resection and retrieval were complete.      Seven sessile polyps were found in the sigmoid colon. The polyps were 1       to 2 mm in size. These polyps were removed with a cold biopsy forceps.       Resection and retrieval were complete.      Four sessile polyps were found in the recto-sigmoid colon. The polyps       were 3 to 4 mm in size. These polyps were removed with a cold snare.       Resection and retrieval were complete.      Five sessile polyps were found in the rectum. The polyps were 1 to 3 mm       in size. These polyps were removed with a cold biopsy forceps. Resection       and retrieval were complete.      Multiple medium-mouthed diverticula were found in the sigmoid colon and       in the descending colon.      Non-bleeding internal hemorrhoids were found during retroflexion. The       hemorrhoids were medium-sized.      The digital rectal exam was normal. Impression:        - One 20 mm polyp in the cecum. Resected and retrieved.                    - One 3 mm polyp in the cecum. Resected and retrieved.                    - One 6 mm polyp in the ascending colon. Resected and                     retrieved.                    - Three 2 to 3 mm polyps in the descending colon. Resected                     and retrieved.                    - Seven 1 to 2 mm polyps in the sigmoid colon. Resected                     and retrieved.                    - Four 3 to 4 mm polyps at the recto-sigmoid colon.                     Resected and retrieved.                    - Five 1 to 3 mm polyps in the  rectum. Resected and                     retrieved. Recommendation:    - Await pathology results.                    - Telephone GI clinic for pathology results in 1 week. Procedure Code(s): --- Professional ---  76195, 59, Colonoscopy, flexible; with endoscopic mucosal                     resection                    [id number], Colonoscopy, flexible; with removal of tumor(s),                     polyp(s), or other lesion(s) by snare technique                    45380, 59, Colonoscopy, flexible; with biopsy, single or                     multiple Diagnosis Code(s): --- Professional ---                    211.3, Benign neoplasm of colon                    211.4, Benign neoplasm of rectum and anal canal                    569.0, Anal and rectal polyp                    V12.72, Personal history of colonic polyps CPT copyright 2014 American Medical Association. All rights reserved. The codes documented in this report are preliminary and upon coder review may  be revised to meet current compliance requirements. Lollie Sails, MD 07/25/2015 10:23:00 AM This report has been signed electronically. Number of Addenda: 0 Note Initiated On: 07/25/2015 8:07 AM Scope Withdrawal Time: 0 hours 49 minutes 41 seconds  Total Procedure Duration: 1 hour 8 minutes 52 seconds       [facility]

## 2015-07-25 NOTE — Anesthesia Preprocedure Evaluation (Signed)
Anesthesia Evaluation  Patient identified by MRN, date of birth, ID band Patient awake    Reviewed: Allergy & Precautions, H&P , NPO status , Patient's Chart, lab work & pertinent test results  History of Anesthesia Complications Negative for: history of anesthetic complications  Airway Mallampati: III  TM Distance: >3 FB Neck ROM: limited    Dental no notable dental hx. (+) Teeth Intact   Pulmonary sleep apnea , COPD, Current Smoker,    Pulmonary exam normal breath sounds clear to auscultation       Cardiovascular Exercise Tolerance: Good hypertension, + angina + CAD and + Peripheral Vascular Disease  Normal cardiovascular exam Rhythm:regular Rate:Normal     Neuro/Psych  Headaches, negative psych ROS   GI/Hepatic negative GI ROS, Neg liver ROS, neg GERD  ,  Endo/Other  negative endocrine ROS  Renal/GU negative Renal ROS  negative genitourinary   Musculoskeletal   Abdominal   Peds  Hematology negative hematology ROS (+)   Anesthesia Other Findings Past Medical History:   Hypertension                                                 OSA (obstructive sleep apnea)                                  Comment:has CPAP but does not use   Hyperlipidemia                                               Raynaud disease                                              Sleep apnea                                                  Migraines                                                    Coronary artery disease                                      COPD (chronic obstructive pulmonary disease)                 Cancer                                                       Chicken pox  Ulcer (traumatic) of oral mucosa                             Hemorrhoids                                                  Raynaud's disease                                            Anginal pain                                                  Hematochezia                                                 Hoarseness                                                   Reproductive/Obstetrics negative OB ROS                             Anesthesia Physical Anesthesia Plan  ASA: III  Anesthesia Plan: General   Post-op Pain Management:    Induction:   Airway Management Planned:   Additional Equipment:   Intra-op Plan:   Post-operative Plan:   Informed Consent: I have reviewed the patients History and Physical, chart, labs and discussed the procedure including the risks, benefits and alternatives for the proposed anesthesia with the patient or authorized representative who has indicated his/her understanding and acceptance.   Dental Advisory Given  Plan Discussed with: Anesthesiologist, CRNA and Surgeon  Anesthesia Plan Comments:         Anesthesia Quick Evaluation

## 2015-07-27 ENCOUNTER — Encounter: Payer: Self-pay | Admitting: Gastroenterology

## 2015-07-28 LAB — SURGICAL PATHOLOGY

## 2015-08-06 ENCOUNTER — Inpatient Hospital Stay: Payer: BLUE CROSS/BLUE SHIELD | Attending: Oncology

## 2015-08-06 ENCOUNTER — Inpatient Hospital Stay: Payer: BLUE CROSS/BLUE SHIELD

## 2015-08-06 ENCOUNTER — Inpatient Hospital Stay (HOSPITAL_BASED_OUTPATIENT_CLINIC_OR_DEPARTMENT_OTHER): Payer: BLUE CROSS/BLUE SHIELD | Admitting: Oncology

## 2015-08-06 VITALS — BP 160/95 | HR 83 | Temp 97.9°F | Resp 16 | Wt 204.1 lb

## 2015-08-06 DIAGNOSIS — Z79899 Other long term (current) drug therapy: Secondary | ICD-10-CM

## 2015-08-06 DIAGNOSIS — Z9989 Dependence on other enabling machines and devices: Secondary | ICD-10-CM | POA: Diagnosis not present

## 2015-08-06 DIAGNOSIS — G4733 Obstructive sleep apnea (adult) (pediatric): Secondary | ICD-10-CM | POA: Diagnosis not present

## 2015-08-06 DIAGNOSIS — D751 Secondary polycythemia: Secondary | ICD-10-CM | POA: Insufficient documentation

## 2015-08-06 DIAGNOSIS — I10 Essential (primary) hypertension: Secondary | ICD-10-CM

## 2015-08-06 DIAGNOSIS — I2510 Atherosclerotic heart disease of native coronary artery without angina pectoris: Secondary | ICD-10-CM | POA: Insufficient documentation

## 2015-08-06 DIAGNOSIS — E785 Hyperlipidemia, unspecified: Secondary | ICD-10-CM

## 2015-08-06 DIAGNOSIS — J449 Chronic obstructive pulmonary disease, unspecified: Secondary | ICD-10-CM

## 2015-08-06 DIAGNOSIS — F17210 Nicotine dependence, cigarettes, uncomplicated: Secondary | ICD-10-CM | POA: Diagnosis not present

## 2015-08-06 DIAGNOSIS — Z86010 Personal history of colonic polyps: Secondary | ICD-10-CM | POA: Diagnosis not present

## 2015-08-06 LAB — CBC WITH DIFFERENTIAL/PLATELET
BASOS ABS: 0.1 10*3/uL (ref 0–0.1)
BASOS PCT: 1 %
EOS ABS: 0.1 10*3/uL (ref 0–0.7)
EOS PCT: 1 %
HCT: 50.1 % (ref 40.0–52.0)
HEMOGLOBIN: 17 g/dL (ref 13.0–18.0)
LYMPHS ABS: 1.9 10*3/uL (ref 1.0–3.6)
Lymphocytes Relative: 19 %
MCH: 31.1 pg (ref 26.0–34.0)
MCHC: 34 g/dL (ref 32.0–36.0)
MCV: 91.4 fL (ref 80.0–100.0)
Monocytes Absolute: 0.7 10*3/uL (ref 0.2–1.0)
Monocytes Relative: 7 %
NEUTROS PCT: 72 %
Neutro Abs: 7.1 10*3/uL — ABNORMAL HIGH (ref 1.4–6.5)
PLATELETS: 252 10*3/uL (ref 150–440)
RBC: 5.48 MIL/uL (ref 4.40–5.90)
RDW: 15.5 % — ABNORMAL HIGH (ref 11.5–14.5)
WBC: 9.9 10*3/uL (ref 3.8–10.6)

## 2015-08-22 NOTE — Progress Notes (Signed)
[place]  [phone] [phone]  ID: [name] OB: 07-29-1956  MR#: [id number]  GQQ#:[id number]  Patient Care Team: Kirk Ruths, MD as PCP - General (Internal Medicine)  CHIEF COMPLAINT:  Chief Complaint  Patient presents with  . polycythemia    INTERVAL HISTORY: Patient returns to clinic today for repeat laboratory work, further evaluation, and continuation of phlebotomy.  He recently had a colonoscopy which removes 22 polyps and reported a significant number more. He continues to feel well and remains asymptomatic.  He continues to smoke heavily. He denies any interest at this time in [place].  He has no neurologic complaints. He denies any recent fevers or illnesses. He has no chest pain or shortness of breath. He denies any nausea, vomiting, constipation, or diarrhea. Patient offers no specific complaints today.   REVIEW OF SYSTEMS:   Review of Systems  Constitutional: Negative.   Respiratory: Negative.   Cardiovascular: Negative.   Gastrointestinal: Negative.  Negative for abdominal pain, blood in stool and melena.    As per HPI. Otherwise, a complete review of systems is negatve.  PAST MEDICAL HISTORY: Past Medical History  Diagnosis Date  . Hypertension   . OSA (obstructive sleep apnea)     has CPAP but does not use  . Hyperlipidemia   . Raynaud disease   . Sleep apnea   . Migraines   . Coronary artery disease   . COPD (chronic obstructive pulmonary disease)   . Cancer   . Chicken pox   . Ulcer (traumatic) of oral mucosa   . Hemorrhoids   . Raynaud's disease   . Anginal pain   . Hematochezia   . Hoarseness     PAST SURGICAL HISTORY: Past Surgical History  Procedure Laterality Date  . Cervical discectomy    . Nasal sinus surgery      x 2   . Back surgery    . Skin graft    . Septoplasty    . Cardiac catheterization    . Colonoscopy    . Colonoscopy N/A 07/25/2015    Procedure: COLONOSCOPY;   Surgeon: Lollie Sails, MD;  Location: [facility] ENDOSCOPY;  Service: Endoscopy;  Laterality: N/A;  . Esophagogastroduodenoscopy N/A 07/25/2015    Procedure: ESOPHAGOGASTRODUODENOSCOPY (EGD);  Surgeon: Lollie Sails, MD;  Location: [facility] ENDOSCOPY;  Service: Endoscopy;  Laterality: N/A;    FAMILY HISTORY Family History  Problem Relation Age of Onset  . Heart disease Paternal Grandmother   . Heart disease Father   . Prostate cancer Father   . Heart attack Maternal Grandfather 67       ADVANCED DIRECTIVES:    HEALTH MAINTENANCE: Social History  Substance Use Topics  . Smoking status: Current Every Day Smoker -- 2.00 packs/day for 45 years    Types: Cigarettes  . Smokeless tobacco: Never Used  . Alcohol Use: 1.2 oz/week    2 Standard drinks or equivalent per week     Colonoscopy:  PAP:  Bone density:  Lipid panel:  Allergies  Allergen Reactions  . Aspirin Other (See Comments)  . Lisinopril Other (See Comments)  . Varenicline Other (See Comments)    Current Outpatient Prescriptions  Medication Sig Dispense Refill  . albuterol (PROAIR HFA) 108 (90 BASE) MCG/ACT inhaler Inhale into the lungs.    Marland Kitchen atorvastatin (LIPITOR) 40 MG tablet Take 1 tablet by mouth daily.    . fluticasone (FLONASE) 50 MCG/ACT nasal spray Place 2 sprays into both nostrils daily. (Patient  taking differently: Place 2 sprays into both nostrils as needed. ) 16 g 2  . hydrochlorothiazide (MICROZIDE) 12.5 MG capsule Take 1 capsule by mouth daily.    . mirtazapine (REMERON) 15 MG tablet Take 0.5 tablets by mouth at bedtime as needed.     . mometasone-formoterol (DULERA) 200-5 MCG/ACT AERO Inhale 2 puffs into the lungs 2 (two) times daily. 1 Inhaler 3  . pantoprazole (PROTONIX) 20 MG tablet Take 1 tablet by mouth daily.    . propranolol ER (INDERAL LA) 80 MG 24 hr capsule Take 80 mg by mouth daily.  5  . Spacer/Aero-Holding Chambers (AEROCHAMBER PLUS) inhaler Use as instructed 1 each 0  . tamsulosin  (FLOMAX) 0.4 MG CAPS capsule Take 0.4 mg by mouth.    . testosterone enanthate (DELATESTRYL) 200 MG/ML injection Inject 1.3 mg into the muscle every 14 (fourteen) days. For IM use only     No current facility-administered medications for this visit.    OBJECTIVE: Filed Vitals:   08/06/15 1006  BP: 160/95  Pulse: 83  Temp: 97.9 F (36.6 C)  Resp: 16     Body mass index is 27.68 kg/(m^2).    ECOG FS:0 - Asymptomatic  General: Well-developed, well-nourished, no acute distress. Eyes: anicteric sclera. Lungs: Clear to auscultation bilaterally. Heart: Regular rate and rhythm. No rubs, murmurs, or gallops. Abdomen: Soft, nontender, nondistended. No organomegaly noted, normoactive bowel sounds. Musculoskeletal: No edema, cyanosis, or clubbing. Neuro: Alert, answering all questions appropriately. Cranial nerves grossly intact. Skin: No rashes or petechiae noted. Psych: Normal affect.   LAB RESULTS:  Lab Results  Component Value Date   NA 136 12/25/2012   K 4.0 12/25/2012   CL 102 12/25/2012   CO2 31 12/25/2012   GLUCOSE 86 12/25/2012   BUN 10 12/25/2012   CREATININE 0.88 12/25/2012   CALCIUM 8.9 12/25/2012   GFRNONAA >60 12/25/2012   GFRAA >60 12/25/2012    Lab Results  Component Value Date   WBC 9.9 08/06/2015   NEUTROABS 7.1* 08/06/2015   HGB 17.0 08/06/2015   HCT 50.1 08/06/2015   MCV 91.4 08/06/2015   PLT 252 08/06/2015     STUDIES: CT screening, Mar 14, 2015.  1. Lung-RADS Category 2, benign appearance or behavior. Continue annual screening with low-dose chest CT without contrast in 12 months 2. Diffuse bronchial wall thickening with emphysema, as above; imaging findings suggestive of underlying COPD. 3. Atherosclerotic disease including LAD and RCA Coronary artery calcification.  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  PLAN:    1.  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  Patient's hemoglobin is 17.0.  His goal is  <17.0. He does not which to pursue phlebotomy today. Return to clinic in 2 months with repeat laboratory work and further evaluation.  Previously the remainder of his laboratory work, including JAK 2 mutation, was either negative or within normal limits.  2.  Tobacco abuse. CT screening results as above. Repeat in one year. 3.  FAP:  Patient has a personal history of greater then 10 adenomatous polyps on his most recent conoloscopy. He does not know of any family history of increased polyps or colon cancer.  Patient meets NCCN guidelines to assess for the APC mutation to assess whether he has FAP or AFAP. If patient is found to be positive, have recommended a colonoscopy with polypectomy every 1-2 years. Colectomy is also possibility if positive, but patient does not wish to pursue this treatment at this time. Genetics results are pending  at time of dictation.  Approximately 30 minutes was spent in discussion and consultation.  Patient expressed understanding and was in agreement with this plan. He also understands that He can call clinic at any time with any questions, concerns, or complaints.    [name], MD   08/22/2015 1:50 PM

## 2015-09-10 ENCOUNTER — Telehealth: Payer: Self-pay | Admitting: *Deleted

## 2015-09-10 ENCOUNTER — Other Ambulatory Visit: Payer: Self-pay | Admitting: Unknown Physician Specialty

## 2015-09-10 DIAGNOSIS — M5412 Radiculopathy, cervical region: Secondary | ICD-10-CM

## 2015-09-10 NOTE — Telephone Encounter (Signed)
patient informed of negative results

## 2015-09-10 NOTE — Telephone Encounter (Addendum)
Please call hm with results of genetic testing done last month

## 2015-09-10 NOTE — Telephone Encounter (Signed)
Result were negative.

## 2015-09-24 ENCOUNTER — Ambulatory Visit
Admission: RE | Admit: 2015-09-24 | Discharge: 2015-09-24 | Disposition: A | Discharge status: Home / Self Care | Payer: BLUE CROSS/BLUE SHIELD | Source: Ambulatory Visit | Attending: Unknown Physician Specialty | Admitting: Unknown Physician Specialty

## 2015-09-24 DIAGNOSIS — M4802 Spinal stenosis, cervical region: Secondary | ICD-10-CM | POA: Diagnosis not present

## 2015-09-24 DIAGNOSIS — G9589 Other specified diseases of spinal cord: Secondary | ICD-10-CM | POA: Insufficient documentation

## 2015-09-24 DIAGNOSIS — M542 Cervicalgia: Secondary | ICD-10-CM | POA: Insufficient documentation

## 2015-09-24 DIAGNOSIS — M5412 Radiculopathy, cervical region: Secondary | ICD-10-CM | POA: Insufficient documentation

## 2015-09-24 DIAGNOSIS — G9520 Unspecified cord compression: Secondary | ICD-10-CM | POA: Insufficient documentation

## 2015-09-24 DIAGNOSIS — M5382 Other specified dorsopathies, cervical region: Secondary | ICD-10-CM | POA: Insufficient documentation

## 2015-09-25 ENCOUNTER — Other Ambulatory Visit: Payer: Self-pay | Admitting: Adult Health

## 2015-09-29 DIAGNOSIS — Z0000 Encounter for general adult medical examination without abnormal findings: Secondary | ICD-10-CM | POA: Insufficient documentation

## 2015-10-06 ENCOUNTER — Inpatient Hospital Stay: Payer: BLUE CROSS/BLUE SHIELD

## 2015-10-06 ENCOUNTER — Inpatient Hospital Stay: Payer: BLUE CROSS/BLUE SHIELD | Admitting: Oncology

## 2015-10-13 ENCOUNTER — Other Ambulatory Visit: Payer: Self-pay | Admitting: *Deleted

## 2015-10-13 DIAGNOSIS — D751 Secondary polycythemia: Secondary | ICD-10-CM

## 2015-10-14 ENCOUNTER — Inpatient Hospital Stay: Payer: BLUE CROSS/BLUE SHIELD

## 2015-10-14 ENCOUNTER — Inpatient Hospital Stay: Payer: BLUE CROSS/BLUE SHIELD | Attending: Oncology

## 2015-10-14 ENCOUNTER — Inpatient Hospital Stay (HOSPITAL_BASED_OUTPATIENT_CLINIC_OR_DEPARTMENT_OTHER): Payer: BLUE CROSS/BLUE SHIELD | Admitting: Oncology

## 2015-10-14 VITALS — BP 143/85 | HR 91 | Temp 96.9°F | Resp 20 | Wt 199.7 lb

## 2015-10-14 DIAGNOSIS — Z8042 Family history of malignant neoplasm of prostate: Secondary | ICD-10-CM | POA: Insufficient documentation

## 2015-10-14 DIAGNOSIS — Z79899 Other long term (current) drug therapy: Secondary | ICD-10-CM | POA: Insufficient documentation

## 2015-10-14 DIAGNOSIS — Z86010 Personal history of colonic polyps: Secondary | ICD-10-CM | POA: Diagnosis not present

## 2015-10-14 DIAGNOSIS — E785 Hyperlipidemia, unspecified: Secondary | ICD-10-CM

## 2015-10-14 DIAGNOSIS — I2510 Atherosclerotic heart disease of native coronary artery without angina pectoris: Secondary | ICD-10-CM | POA: Diagnosis not present

## 2015-10-14 DIAGNOSIS — G4733 Obstructive sleep apnea (adult) (pediatric): Secondary | ICD-10-CM | POA: Diagnosis not present

## 2015-10-14 DIAGNOSIS — F17210 Nicotine dependence, cigarettes, uncomplicated: Secondary | ICD-10-CM | POA: Diagnosis not present

## 2015-10-14 DIAGNOSIS — I10 Essential (primary) hypertension: Secondary | ICD-10-CM | POA: Insufficient documentation

## 2015-10-14 DIAGNOSIS — D751 Secondary polycythemia: Secondary | ICD-10-CM | POA: Insufficient documentation

## 2015-10-14 DIAGNOSIS — J449 Chronic obstructive pulmonary disease, unspecified: Secondary | ICD-10-CM | POA: Insufficient documentation

## 2015-10-14 LAB — CBC WITH DIFFERENTIAL/PLATELET
BASOS ABS: 0.1 10*3/uL (ref 0–0.1)
BASOS PCT: 1 %
EOS ABS: 0 10*3/uL (ref 0–0.7)
Eosinophils Relative: 0 %
HEMATOCRIT: 50.3 % (ref 40.0–52.0)
Hemoglobin: 16.8 g/dL (ref 13.0–18.0)
Lymphocytes Relative: 9 %
Lymphs Abs: 1.3 10*3/uL (ref 1.0–3.6)
MCH: 30.5 pg (ref 26.0–34.0)
MCHC: 33.4 g/dL (ref 32.0–36.0)
MCV: 91.3 fL (ref 80.0–100.0)
MONO ABS: 0.7 10*3/uL (ref 0.2–1.0)
Monocytes Relative: 5 %
NEUTROS ABS: 12.6 10*3/uL — AB (ref 1.4–6.5)
NEUTROS PCT: 85 %
PLATELETS: 304 10*3/uL (ref 150–440)
RBC: 5.51 MIL/uL (ref 4.40–5.90)
RDW: 15.2 % — AB (ref 11.5–14.5)
WBC: 14.7 10*3/uL — ABNORMAL HIGH (ref 3.8–10.6)

## 2015-10-14 NOTE — Progress Notes (Signed)
Patient here today for routine follow up regarding polycythemia. Patient denies any concerns today.

## 2015-11-01 NOTE — Progress Notes (Signed)
[facility]  [phone] [phone]  ID: [name] OB: February 27, 1956  MR#: [id number]  OEV#:[id number]  Patient Care Team: Kirk Ruths, MD as PCP - General (Internal Medicine)  CHIEF COMPLAINT:  Chief Complaint  Patient presents with  . polycythemia    follow up    INTERVAL HISTORY: Patient returns to clinic today for repeat laboratory work, further evaluation, and continuation of phlebotomy.  He continues to feel well and remains asymptomatic.  He continues to smoke heavily. He denies any interest at this time in [place].  He has no neurologic complaints. He denies any recent fevers or illnesses. He has no chest pain or shortness of breath. He denies any nausea, vomiting, constipation, or diarrhea. Patient offers no specific complaints today.   REVIEW OF SYSTEMS:   Review of Systems  Constitutional: Negative.   Respiratory: Negative.   Cardiovascular: Negative.   Gastrointestinal: Negative.  Negative for abdominal pain, blood in stool and melena.    As per HPI. Otherwise, a complete review of systems is negatve.  PAST MEDICAL HISTORY: Past Medical History  Diagnosis Date  . Hypertension   . OSA (obstructive sleep apnea)     has CPAP but does not use  . Hyperlipidemia   . Raynaud disease   . Sleep apnea   . Migraines   . Coronary artery disease   . COPD (chronic obstructive pulmonary disease)   . Cancer   . Chicken pox   . Ulcer (traumatic) of oral mucosa   . Hemorrhoids   . Raynaud's disease   . Anginal pain   . Hematochezia   . Hoarseness     PAST SURGICAL HISTORY: Past Surgical History  Procedure Laterality Date  . Cervical discectomy    . Nasal sinus surgery      x 2   . Back surgery    . Skin graft    . Septoplasty    . Cardiac catheterization    . Colonoscopy    . Colonoscopy N/A 07/25/2015    Procedure: COLONOSCOPY;  Surgeon: Lollie Sails, MD;  Location: [facility] ENDOSCOPY;  Service: Endoscopy;   Laterality: N/A;  . Esophagogastroduodenoscopy N/A 07/25/2015    Procedure: ESOPHAGOGASTRODUODENOSCOPY (EGD);  Surgeon: Lollie Sails, MD;  Location: [facility] ENDOSCOPY;  Service: Endoscopy;  Laterality: N/A;    FAMILY HISTORY Family History  Problem Relation Age of Onset  . Heart disease Paternal Grandmother   . Heart disease Father   . Prostate cancer Father   . Heart attack Maternal Grandfather 75       ADVANCED DIRECTIVES:    HEALTH MAINTENANCE: Social History  Substance Use Topics  . Smoking status: Current Every Day Smoker -- 2.00 packs/day for 45 years    Types: Cigarettes  . Smokeless tobacco: Never Used  . Alcohol Use: 1.2 oz/week    2 Standard drinks or equivalent per week     Colonoscopy:  PAP:  Bone density:  Lipid panel:  Allergies  Allergen Reactions  . Aspirin Other (See Comments)  . Lisinopril Other (See Comments)  . Varenicline Other (See Comments)    Current Outpatient Prescriptions  Medication Sig Dispense Refill  . albuterol (PROAIR HFA) 108 (90 BASE) MCG/ACT inhaler Inhale into the lungs.    Marland Kitchen atorvastatin (LIPITOR) 40 MG tablet Take 1 tablet by mouth daily.    . clopidogrel (PLAVIX) 75 MG tablet Take 75 mg by mouth daily.    . DULERA 200-5 MCG/ACT AERO INHALE 2 PUFFS INTO THE  LUNGS 2 (TWO) TIMES DAILY. 13 Inhaler 3  . fluticasone (FLONASE) 50 MCG/ACT nasal spray Place 2 sprays into both nostrils daily. (Patient taking differently: Place 2 sprays into both nostrils as needed. ) 16 g 2  . gabapentin (NEURONTIN) 300 MG capsule Take 600 mg by mouth at bedtime.    . hydrochlorothiazide (MICROZIDE) 12.5 MG capsule Take 1 capsule by mouth daily.    . mirtazapine (REMERON) 15 MG tablet Take 0.5 tablets by mouth at bedtime as needed.     . OxyCODONE HCl, Abuse Deter, (OXAYDO) 5 MG TABA Take 5 mg by mouth every 4 (four) hours as needed.    . pantoprazole (PROTONIX) 20 MG tablet Take 1 tablet by mouth daily.    . propranolol ER (INDERAL LA) 80 MG 24 hr  capsule Take 80 mg by mouth daily.  5  . Spacer/Aero-Holding Chambers (AEROCHAMBER PLUS) inhaler Use as instructed 1 each 0  . tamsulosin (FLOMAX) 0.4 MG CAPS capsule Take 0.4 mg by mouth.    . testosterone enanthate (DELATESTRYL) 200 MG/ML injection Inject 1.3 mg into the muscle every 14 (fourteen) days. For IM use only    . tiZANidine (ZANAFLEX) 4 MG tablet Take 4 mg by mouth at bedtime.     No current facility-administered medications for this visit.    OBJECTIVE: Filed Vitals:   10/14/15 1403  BP: 143/85  Pulse: 91  Temp: 96.9 F (36.1 C)  Resp: 20     Body mass index is 27.08 kg/(m^2).    ECOG FS:0 - Asymptomatic  General: Well-developed, well-nourished, no acute distress. Eyes: anicteric sclera. Lungs: Clear to auscultation bilaterally. Heart: Regular rate and rhythm. No rubs, murmurs, or gallops. Abdomen: Soft, nontender, nondistended. No organomegaly noted, normoactive bowel sounds. Musculoskeletal: No edema, cyanosis, or clubbing. Neuro: Alert, answering all questions appropriately. Cranial nerves grossly intact. Skin: No rashes or petechiae noted. Psych: Normal affect.   LAB RESULTS:  Lab Results  Component Value Date   NA 136 12/25/2012   K 4.0 12/25/2012   CL 102 12/25/2012   CO2 31 12/25/2012   GLUCOSE 86 12/25/2012   BUN 10 12/25/2012   CREATININE 0.88 12/25/2012   CALCIUM 8.9 12/25/2012   GFRNONAA >60 12/25/2012   GFRAA >60 12/25/2012    Lab Results  Component Value Date   WBC 14.7* 10/14/2015   NEUTROABS 12.6* 10/14/2015   HGB 16.8 10/14/2015   HCT 50.3 10/14/2015   MCV 91.3 10/14/2015   PLT 304 10/14/2015     STUDIES: CT screening, Mar 14, 2015.  1. Lung-RADS Category 2, benign appearance or behavior. Continue annual screening with low-dose chest CT without contrast in 12 months 2. Diffuse bronchial wall thickening with emphysema, as above; imaging findings suggestive of underlying COPD. 3. Atherosclerotic disease including LAD and RCA  Coronary artery calcification.  ASSESSMENT: Polycythemia.  PLAN:    1.  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  Patient's hemoglobin is 16.8 with goal to maintain his hemoglobin below 17.0.  He does not which to pursue phlebotomy today. Return to clinic in 3 months with repeat laboratory work and further evaluation.  Previously the remainder of his laboratory work, including JAK 2 mutation, was either negative or within normal limits.  2.  Tobacco abuse. CT screening results as above. Repeat in one year. 3.  Colon polyps:  Patient has a personal history of greater then 10 adenomatous polyps on his most recent conoloscopy. He does not know of any family history of increased polyps or  colon cancer.  Genetic testing to assess for the APC mutation for FAP or AFAP was negative. Continue colonoscopies as per GI.   Patient expressed understanding and was in agreement with this plan. He also understands that He can call clinic at any time with any questions, concerns, or complaints.    [name], MD   11/01/2015 8:54 AM

## 2016-01-01 ENCOUNTER — Other Ambulatory Visit: Payer: Self-pay | Admitting: Gastroenterology

## 2016-01-01 DIAGNOSIS — K7689 Other specified diseases of liver: Secondary | ICD-10-CM

## 2016-01-08 ENCOUNTER — Ambulatory Visit
Admission: RE | Admit: 2016-01-08 | Discharge: 2016-01-08 | Disposition: A | Discharge status: Home / Self Care | Payer: BLUE CROSS/BLUE SHIELD | Source: Ambulatory Visit | Attending: Gastroenterology | Admitting: Gastroenterology

## 2016-01-08 DIAGNOSIS — K7689 Other specified diseases of liver: Secondary | ICD-10-CM | POA: Insufficient documentation

## 2016-01-08 LAB — POCT I-STAT CREATININE: Creatinine, Ser: 1 mg/dL (ref 0.61–1.24)

## 2016-01-08 MED ORDER — IOHEXOL 350 MG/ML SOLN
100.0000 mL | Freq: Once | INTRAVENOUS | Status: AC | PRN
  Administered 2016-01-08: 100 mL via INTRAVENOUS

## 2016-01-10 ENCOUNTER — Ambulatory Visit (INDEPENDENT_AMBULATORY_CARE_PROVIDER_SITE_OTHER): Payer: BLUE CROSS/BLUE SHIELD

## 2016-01-10 ENCOUNTER — Ambulatory Visit
Admission: EM | Admit: 2016-01-10 | Discharge: 2016-01-10 | Disposition: A | Discharge status: Home / Self Care | Payer: BLUE CROSS/BLUE SHIELD | Attending: Family Medicine | Admitting: Family Medicine

## 2016-01-10 DIAGNOSIS — J0100 Acute maxillary sinusitis, unspecified: Secondary | ICD-10-CM

## 2016-01-10 DIAGNOSIS — J441 Chronic obstructive pulmonary disease with (acute) exacerbation: Secondary | ICD-10-CM

## 2016-01-10 DIAGNOSIS — H6593 Unspecified nonsuppurative otitis media, bilateral: Secondary | ICD-10-CM | POA: Diagnosis not present

## 2016-01-10 DIAGNOSIS — B370 Candidal stomatitis: Secondary | ICD-10-CM | POA: Diagnosis not present

## 2016-01-10 LAB — RAPID INFLUENZA A&B ANTIGENS: Influenza A (ARMC): NEGATIVE

## 2016-01-10 LAB — RAPID INFLUENZA A&B ANTIGENS (ARMC ONLY): INFLUENZA B (ARMC): NEGATIVE

## 2016-01-10 MED ORDER — IPRATROPIUM-ALBUTEROL 0.5-2.5 (3) MG/3ML IN SOLN
3.0000 mL | Freq: Once | RESPIRATORY_TRACT | Status: AC
  Administered 2016-01-10: 3 mL via RESPIRATORY_TRACT

## 2016-01-10 MED ORDER — CLOTRIMAZOLE 10 MG MT TROC: 10.0000 mg | Freq: Every day | OROMUCOSAL | Status: AC

## 2016-01-10 MED ORDER — LEVOFLOXACIN 500 MG PO TABS: 500.0000 mg | ORAL_TABLET | Freq: Every day | ORAL | Status: AC

## 2016-01-10 MED ORDER — BENZONATATE 100 MG PO CAPS: 100.0000 mg | ORAL_CAPSULE | Freq: Three times a day (TID) | ORAL | Status: DC | PRN

## 2016-01-10 MED ORDER — SALINE SPRAY 0.65 % NA SOLN: 2.0000 | NASAL | Status: DC

## 2016-01-10 NOTE — Discharge Instructions (Signed)
Thrush, Adult Ritta Slot, also called oral candidiasis, is a fungal infection that develops in the mouth and throat and on the tongue. It causes white patches to form on the mouth and tongue. Ritta Slot is most common in older adults, but it can occur at any age.  Many cases of thrush are mild, but this infection can also be more serious. Ritta Slot can be a recurring problem for people who have chronic illnesses or who take medicines that limit the body's ability to fight infection. Because these people have difficulty fighting infections, the fungus that causes thrush can spread throughout the body. This can cause life-threatening blood or organ infections. CAUSES  Ritta Slot is usually caused by a yeast called Candida albicans. This fungus is normally present in small amounts in the mouth and on other mucous membranes. It usually causes no harm. However, when conditions are present that allow the fungus to grow uncontrolled, it invades surrounding tissues and becomes an infection. Less often, other Candida species can also lead to thrush.  RISK FACTORS Ritta Slot is more likely to develop in the following people:  People with an impaired ability to fight infection (weakened immune system).   Older adults.   People with HIV.   People with diabetes.   People with dry mouth (xerostomia).   Pregnant women.   People with poor dental care, especially those who have false teeth.   People who use antibiotic medicines.  SIGNS AND SYMPTOMS  Ritta Slot can be a mild infection that causes no symptoms. If symptoms develop, they may include:   A burning feeling in the mouth and throat. This can occur at the start of a thrush infection.   White patches that adhere to the mouth and tongue. The tissue around the patches may be red, raw, and painful. If rubbed (during tooth brushing, for example), the patches and the tissue of the mouth may bleed easily.   A bad taste in the mouth or difficulty tasting foods.    Cottony feeling in the mouth.   Pain during eating and swallowing. DIAGNOSIS  Your health care provider can usually diagnose thrush by looking in your mouth and asking you questions about your health.  TREATMENT  Medicines that help prevent the growth of fungi (antifungals) are the standard treatment for thrush. These medicines are either applied directly to the affected area (topical) or swallowed (oral). The treatment will depend on the severity of the condition.  Mild Thrush Mild cases of thrush may clear up with the use of an antifungal mouth rinse or lozenges. Treatment usually lasts about 14 days.  Moderate to Severe Thrush  More severe thrush infections that have spread to the esophagus are treated with an oral antifungal medicine. A topical antifungal medicine may also be used.   For some severe infections, a treatment period longer than 14 days may be needed.   Oral antifungal medicines are almost never used during pregnancy because the fetus may be harmed. However, if a pregnant woman has a rare, severe thrush infection that has spread to her blood, oral antifungal medicines may be used. In this case, the risk of harm to the mother and fetus from the severe thrush infection may be greater than the risk posed by the use of antifungal medicines.  Persistent or Recurrent Thrush For cases of thrush that do not go away or keep coming back, treatment may involve the following:   Treatment may be needed twice as long as the symptoms last.   Treatment will include  both oral and topical antifungal medicines.   People with weakened immune systems can take an antifungal medicine on a continuous basis to prevent thrush infections.  It is important to treat conditions that make you more likely to get thrush, such as diabetes or HIV.  HOME CARE INSTRUCTIONS   Only take over-the-counter or prescription medicine as directed by your health care provider. Talk to your health care  provider about an over-the-counter medicine called gentian violet, which kills bacteria and fungi.   Eat plain, unflavored yogurt as directed by your health care provider. Check the label to make sure the yogurt contains live cultures. This yogurt can help healthy bacteria grow in the mouth that can stop the growth of the fungus that causes thrush.   Try these measures to help reduce the discomfort of thrush:   Drink cold liquids such as water or iced tea.   Try flavored ice treats or frozen juices.   Eat foods that are easy to swallow, such as gelatin, ice cream, or custard.   If the patches in your mouth are painful, try drinking from a straw.   Rinse your mouth several times a day with a warm saltwater rinse. You can make the saltwater mixture with 1 tsp (6 g) of salt in 8 fl oz (0.2 L) of warm water.   If you wear dentures, remove the dentures before going to bed, brush them vigorously, and soak them in a cleaning solution as directed by your health care provider.   Women who are breastfeeding should clean their nipples with an antifungal medicine as directed by their health care provider. Dry the nipples after breastfeeding. Applying lanolin-containing body lotion may help relieve nipple soreness.  SEEK MEDICAL CARE IF:  Your symptoms are getting worse or are not improving within 7 days of starting treatment.   You have symptoms of spreading infection, such as white patches on the skin outside of the mouth.   You are nursing and you have redness, burning, or pain in the nipples that is not relieved with treatment.  MAKE SURE YOU:  Understand these instructions.  Will watch your condition.  Will get help right away if you are not doing well or get worse.   This information is not intended to replace advice given to you by your health care provider. Make sure you discuss any questions you have with your health care provider.   Document Released: 07/13/2004 Document  Revised: 11/08/2014 Document Reviewed: 05/21/2013 Elsevier Interactive Patient Education 2016 [place]. Otitis Media With Effusion Otitis media with effusion is the presence of fluid in the middle ear. This is a common problem in children, which often follows ear infections. It may be present for weeks or longer after the infection. Unlike an acute ear infection, otitis media with effusion refers only to fluid behind the ear drum and not infection. Children with repeated ear and sinus infections and allergy problems are the most likely to get otitis media with effusion. CAUSES  The most frequent cause of the fluid buildup is dysfunction of the eustachian tubes. These are the tubes that drain fluid in the ears to the back of the nose (nasopharynx). SYMPTOMS   The main symptom of this condition is hearing loss. As a result, you or your child may:  Listen to the TV at a loud volume.  Not respond to questions.  Ask "what" often when spoken to.  Mistake or confuse one sound or word for another.  There may be  a sensation of fullness or pressure but usually not pain. DIAGNOSIS   Your health care provider will diagnose this condition by examining you or your child's ears.  Your health care provider may test the pressure in you or your child's ear with a tympanometer.  A hearing test may be conducted if the problem persists. TREATMENT   Treatment depends on the duration and the effects of the effusion.  Antibiotics, decongestants, nose drops, and cortisone-type drugs (tablets or nasal spray) may not be helpful.  Children with persistent ear effusions may have delayed language or behavioral problems. Children at risk for developmental delays in hearing, learning, and speech may require referral to a specialist earlier than children not at risk.  You or your child's health care provider may suggest a referral to an ear, nose, and throat surgeon for treatment. The following may help  restore normal hearing:  Drainage of fluid.  Placement of ear tubes (tympanostomy tubes).  Removal of adenoids (adenoidectomy). HOME CARE INSTRUCTIONS   Avoid secondhand smoke.  Infants who are breastfed are less likely to have this condition.  Avoid feeding infants while they are lying flat.  Avoid known environmental allergens.  Avoid people who are sick. SEEK MEDICAL CARE IF:   Hearing is not better in 3 months.  Hearing is worse.  Ear pain.  Drainage from the ear.  Dizziness. MAKE SURE YOU:   Understand these instructions.  Will watch your condition.  Will get help right away if you are not doing well or get worse.   This information is not intended to replace advice given to you by your health care provider. Make sure you discuss any questions you have with your health care provider.   Document Released: 11/25/2004 Document Revised: 11/08/2014 Document Reviewed: 05/15/2013 Elsevier Interactive Patient Education 2016 [employer] Sinusitis, Adult Sinusitis is redness, soreness, and inflammation of the paranasal sinuses. Paranasal sinuses are air pockets within the bones of your face. They are located beneath your eyes, in the middle of your forehead, and above your eyes. In healthy paranasal sinuses, mucus is able to drain out, and air is able to circulate through them by way of your nose. However, when your paranasal sinuses are inflamed, mucus and air can become trapped. This can allow bacteria and other germs to grow and cause infection. Sinusitis can develop quickly and last only a short time (acute) or continue over a long period (chronic). Sinusitis that lasts for more than 12 weeks is considered chronic. CAUSES Causes of sinusitis include:  Allergies.  Structural abnormalities, such as displacement of the cartilage that separates your nostrils (deviated septum), which can decrease the air flow through your nose and sinuses and affect sinus  drainage.  Functional abnormalities, such as when the small hairs (cilia) that line your sinuses and help remove mucus do not work properly or are not present. SIGNS AND SYMPTOMS Symptoms of acute and chronic sinusitis are the same. The primary symptoms are pain and pressure around the affected sinuses. Other symptoms include:  Upper toothache.  Earache.  Headache.  Bad breath.  Decreased sense of smell and taste.  A cough, which worsens when you are lying flat.  Fatigue.  Fever.  Thick drainage from your nose, which often is green and may contain pus (purulent).  Swelling and warmth over the affected sinuses. DIAGNOSIS Your health care provider will perform a physical exam. During your exam, your health care provider may perform any of the following to help determine if you  have acute sinusitis or chronic sinusitis:  Look in your nose for signs of abnormal growths in your nostrils (nasal polyps).  Tap over the affected sinus to check for signs of infection.  View the inside of your sinuses using an imaging device that has a light attached (endoscope). If your health care provider suspects that you have chronic sinusitis, one or more of the following tests may be recommended:  Allergy tests.  Nasal culture. A sample of mucus is taken from your nose, sent to a lab, and screened for bacteria.  Nasal cytology. A sample of mucus is taken from your nose and examined by your health care provider to determine if your sinusitis is related to an allergy. TREATMENT Most cases of acute sinusitis are related to a viral infection and will resolve on their own within 10 days. Sometimes, medicines are prescribed to help relieve symptoms of both acute and chronic sinusitis. These may include pain medicines, decongestants, nasal steroid sprays, or saline sprays. However, for sinusitis related to a bacterial infection, your health care provider will prescribe antibiotic medicines. These are  medicines that will help kill the bacteria causing the infection. Rarely, sinusitis is caused by a fungal infection. In these cases, your health care provider will prescribe antifungal medicine. For some cases of chronic sinusitis, surgery is needed. Generally, these are cases in which sinusitis recurs more than 3 times per year, despite other treatments. HOME CARE INSTRUCTIONS  Drink plenty of water. Water helps thin the mucus so your sinuses can drain more easily.  Use a humidifier.  Inhale steam 3-4 times a day (for example, sit in the bathroom with the shower running).  Apply a warm, moist washcloth to your face 3-4 times a day, or as directed by your health care provider.  Use saline nasal sprays to help moisten and clean your sinuses.  Take medicines only as directed by your health care provider.  If you were prescribed either an antibiotic or antifungal medicine, finish it all even if you start to feel better. SEEK IMMEDIATE MEDICAL CARE IF:  You have increasing pain or severe headaches.  You have nausea, vomiting, or drowsiness.  You have swelling around your face.  You have vision problems.  You have a stiff neck.  You have difficulty breathing.   This information is not intended to replace advice given to you by your health care provider. Make sure you discuss any questions you have with your health care provider.   Document Released: 10/18/2005 Document Revised: 11/08/2014 Document Reviewed: 11/02/2011 Elsevier Interactive Patient Education 2016 [place]. Otitis Media With Effusion Otitis media with effusion is the presence of fluid in the middle ear. This is a common problem in children, which often follows ear infections. It may be present for weeks or longer after the infection. Unlike an acute ear infection, otitis media with effusion refers only to fluid behind the ear drum and not infection. Children with repeated ear and sinus infections and allergy  problems are the most likely to get otitis media with effusion. CAUSES  The most frequent cause of the fluid buildup is dysfunction of the eustachian tubes. These are the tubes that drain fluid in the ears to the back of the nose (nasopharynx). SYMPTOMS   The main symptom of this condition is hearing loss. As a result, you or your child may:  Listen to the TV at a loud volume.  Not respond to questions.  Ask "what" often when spoken to.  Mistake or  confuse one sound or word for another.  There may be a sensation of fullness or pressure but usually not pain. DIAGNOSIS   Your health care provider will diagnose this condition by examining you or your child's ears.  Your health care provider may test the pressure in you or your child's ear with a tympanometer.  A hearing test may be conducted if the problem persists. TREATMENT   Treatment depends on the duration and the effects of the effusion.  Antibiotics, decongestants, nose drops, and cortisone-type drugs (tablets or nasal spray) may not be helpful.  Children with persistent ear effusions may have delayed language or behavioral problems. Children at risk for developmental delays in hearing, learning, and speech may require referral to a specialist earlier than children not at risk.  You or your child's health care provider may suggest a referral to an ear, nose, and throat surgeon for treatment. The following may help restore normal hearing:  Drainage of fluid.  Placement of ear tubes (tympanostomy tubes).  Removal of adenoids (adenoidectomy). HOME CARE INSTRUCTIONS   Avoid secondhand smoke.  Infants who are breastfed are less likely to have this condition.  Avoid feeding infants while they are lying flat.  Avoid known environmental allergens.  Avoid people who are sick. SEEK MEDICAL CARE IF:   Hearing is not better in 3 months.  Hearing is worse.  Ear pain.  Drainage from the ear.  Dizziness. MAKE SURE  YOU:   Understand these instructions.  Will watch your condition.  Will get help right away if you are not doing well or get worse.   This information is not intended to replace advice given to you by your health care provider. Make sure you discuss any questions you have with your health care provider.   Document Released: 11/25/2004 Document Revised: 11/08/2014 Document Reviewed: 05/15/2013 Elsevier Interactive Patient Education 2016 [employer] Chronic Obstructive Pulmonary Disease Exacerbation Chronic obstructive pulmonary disease (COPD) is a common lung condition in which airflow from the lungs is limited. COPD is a general term that can be used to describe many different lung problems that limit airflow, including chronic bronchitis and emphysema. COPD exacerbations are episodes when breathing symptoms become much worse and require extra treatment. Without treatment, COPD exacerbations can be life threatening, and frequent COPD exacerbations can cause further damage to your lungs. CAUSES  Respiratory infections.  Exposure to smoke.  Exposure to air pollution, chemical fumes, or dust. Sometimes there is no apparent cause or trigger. RISK FACTORS  Smoking cigarettes.  Older age.  Frequent prior COPD exacerbations. SIGNS AND SYMPTOMS  Increased coughing.  Increased thick spit (sputum) production.  Increased wheezing.  Increased shortness of breath.  Rapid breathing.  Chest tightness. DIAGNOSIS Your medical history, a physical exam, and tests will help your health care provider make a diagnosis. Tests may include:  A chest X-ray.  Basic lab tests.  Sputum testing.  An arterial blood gas test. TREATMENT Depending on the severity of your COPD exacerbation, you may need to be admitted to a hospital for treatment. Some of the treatments commonly used to treat COPD exacerbations are:   Antibiotic medicines.  Bronchodilators. These are drugs that expand the air  passages. They may be given with an inhaler or nebulizer. Spacer devices may be needed to help improve drug delivery.  Corticosteroid medicines.  Supplemental oxygen therapy.  Airway clearing techniques, such as noninvasive ventilation (NIV) and positive expiratory pressure (PEP). These provide respiratory support through a mask or other noninvasive  device. HOME CARE INSTRUCTIONS  Do not smoke. Quitting smoking is very important to prevent COPD from getting worse and exacerbations from happening as often.  Avoid exposure to all substances that irritate the airway, especially to tobacco smoke.  If you were prescribed an antibiotic medicine, finish it all even if you start to feel better.  Take all medicines as directed by your health care provider.It is important to use correct technique with inhaled medicines.  Drink enough fluids to keep your urine clear or pale yellow (unless you have a medical condition that requires fluid restriction).  Use a cool mist vaporizer. This makes it easier to clear your chest when you cough.  If you have a home nebulizer and oxygen, continue to use them as directed.  Maintain all necessary vaccinations to prevent infections.  Exercise regularly.  Eat a healthy diet.  Keep all follow-up appointments as directed by your health care provider. SEEK IMMEDIATE MEDICAL CARE IF:  You have worsening shortness of breath.  You have trouble talking.  You have severe chest pain.  You have blood in your sputum.  You have a fever.  You have weakness, vomit repeatedly, or faint.  You feel confused.  You continue to get worse. MAKE SURE YOU:  Understand these instructions.  Will watch your condition.  Will get help right away if you are not doing well or get worse.   This information is not intended to replace advice given to you by your health care provider. Make sure you discuss any questions you have with your health care provider.    Document Released: 08/15/2007 Document Revised: 11/08/2014 Document Reviewed: 06/22/2013 Elsevier Interactive Patient Education [employer].

## 2016-01-10 NOTE — ED Notes (Signed)
Patient c/o cough for 3-4 days now.  He had Cervical Discectomy on 12/30/2015.

## 2016-01-10 NOTE — ED Provider Notes (Signed)
CSN: [id number]     Arrival date & time 01/10/16  1150 History   First MD Initiated Contact with Patient 01/10/16 1215     Chief Complaint  Patient presents with  . Cough   (Consider location/radiation/quality/duration/timing/severity/associated sxs/prior Treatment) HPI Comments: Married caucasian male here for evaluation of cough nonproductive x 4 days, hx cervical surgery 12/30/2015 did not stay overnight has been sleeping in recliner on porch and smoking heavily since surgery per spouse.  MDIs at home temporarily stop wheezing but returns.  Using codeine cough medicine and OTC cough suppressants po prn.  Patient is a 60 y.o. male presenting with cough. The history is provided by the patient and the spouse.  Cough Cough characteristics:  Productive Sputum characteristics:  Clear Severity:  Moderate Onset quality:  Sudden Duration:  4 days Timing:  Constant Progression:  Worsening Chronicity:  Recurrent Smoker: yes   Context: exposure to allergens, sick contacts, smoke exposure, upper respiratory infection, weather changes and with activity   Context: not animal exposure, not fumes and not occupational exposure   Relieved by:  Beta-agonist inhaler Worsened by:  Exposure to cold air, lying down, environmental changes and activity Ineffective treatments:  Ipratropium inhaler, beta-agonist inhaler, rest and steroid inhaler Associated symptoms: chest pain, ear fullness, ear pain, headaches, rash, rhinorrhea, shortness of breath, sinus congestion, sore throat and wheezing   Associated symptoms: no chills, no diaphoresis, no eye discharge, no fever and no myalgias   Risk factors: no recent infection and no recent travel     Past Medical History  Diagnosis Date  . Hypertension   . OSA (obstructive sleep apnea)     has CPAP but does not use  . Hyperlipidemia   . Raynaud disease   . Sleep apnea   . Migraines   . Coronary artery disease   . COPD (chronic obstructive pulmonary disease)  ([place])   . Chicken pox   . Ulcer (traumatic) of oral mucosa   . Hemorrhoids   . Raynaud's disease   . Anginal pain ([place])   . Hematochezia   . Hoarseness    Past Surgical History  Procedure Laterality Date  . Cervical discectomy    . Nasal sinus surgery      x 2   . Back surgery    . Skin graft    . Septoplasty    . Cardiac catheterization    . Colonoscopy    . Colonoscopy N/A 07/25/2015    Procedure: COLONOSCOPY;  Surgeon: Lollie Sails, MD;  Location: [facility] ENDOSCOPY;  Service: Endoscopy;  Laterality: N/A;  . Esophagogastroduodenoscopy N/A 07/25/2015    Procedure: ESOPHAGOGASTRODUODENOSCOPY (EGD);  Surgeon: Lollie Sails, MD;  Location: [facility] ENDOSCOPY;  Service: Endoscopy;  Laterality: N/A;   Family History  Problem Relation Age of Onset  . Heart disease Paternal Grandmother   . Heart disease Father   . Prostate cancer Father   . Heart attack Maternal Grandfather 52   Social History  Substance Use Topics  . Smoking status: Current Every Day Smoker -- 2.00 packs/day for 45 years    Types: Cigarettes  . Smokeless tobacco: Never Used  . Alcohol Use: 1.2 oz/week    2 Standard drinks or equivalent per week    Review of Systems  Constitutional: Positive for activity change. Negative for fever, chills, diaphoresis, appetite change, fatigue and unexpected weight change.  HENT: Positive for congestion, ear pain, postnasal drip, rhinorrhea, sinus pressure and sore throat. Negative for dental problem, drooling, ear discharge, facial  swelling, hearing loss, mouth sores, nosebleeds, sneezing, tinnitus, trouble swallowing and voice change.   Eyes: Negative for photophobia, pain, discharge, redness, itching and visual disturbance.  Respiratory: Positive for cough, shortness of breath and wheezing. Negative for choking, chest tightness and stridor.   Cardiovascular: Positive for chest pain. Negative for palpitations and leg swelling.  Gastrointestinal: Negative for nausea,  vomiting, abdominal pain, diarrhea, constipation, blood in stool and abdominal distention.  Endocrine: Negative for cold intolerance and heat intolerance.  Genitourinary: Negative for dysuria.  Musculoskeletal: Negative for myalgias, back pain, joint swelling, arthralgias, gait problem, neck pain and neck stiffness.  Skin: Positive for rash. Negative for color change, pallor and wound.  Allergic/Immunologic: Positive for environmental allergies. Negative for food allergies and immunocompromised state.  Neurological: Positive for headaches. Negative for dizziness, tremors, seizures, syncope, facial asymmetry, speech difficulty, weakness, light-headedness and numbness.  Hematological: Negative for adenopathy. Does not bruise/bleed easily.  Psychiatric/Behavioral: Negative for behavioral problems, confusion, sleep disturbance and agitation.    Allergies  Aspirin; Lisinopril; and Varenicline  Home Medications   Prior to Admission medications   Medication Sig Start Date End Date Taking? Authorizing Provider  diazepam (VALIUM) 5 MG tablet Take 5 mg by mouth every 6 (six) hours as needed for anxiety.   Yes Historical Provider, MD  albuterol (PROAIR HFA) 108 (90 BASE) MCG/ACT inhaler Inhale into the lungs. 12/06/13   Historical Provider, MD  atorvastatin (LIPITOR) 40 MG tablet Take 1 tablet by mouth daily. 03/30/13   Historical Provider, MD  benzonatate (TESSALON PERLES) 100 MG capsule Take 1 capsule (100 mg total) by mouth 3 (three) times daily as needed for cough. 01/10/16 01/16/16  Olen Cordial, NP  clopidogrel (PLAVIX) 75 MG tablet Take 75 mg by mouth daily.    Historical Provider, MD  clotrimazole (MYCELEX) 10 MG troche Take 1 tablet (10 mg total) by mouth 5 (five) times daily. 01/10/16 01/24/16  Aura Fey Robyn Galati, NP  DULERA 200-5 MCG/ACT AERO INHALE 2 PUFFS INTO THE LUNGS 2 (TWO) TIMES DAILY. 09/26/15   Vishal Mungal, MD  fluticasone (FLONASE) 50 MCG/ACT nasal spray Place 2 sprays into both  nostrils daily. Patient taking differently: Place 2 sprays into both nostrils as needed.  12/06/13   Juanito Doom, MD  gabapentin (NEURONTIN) 300 MG capsule Take 600 mg by mouth at bedtime.    Historical Provider, MD  hydrochlorothiazide (MICROZIDE) 12.5 MG capsule Take 1 capsule by mouth daily. 01/29/13   Historical Provider, MD  levofloxacin (LEVAQUIN) 500 MG tablet Take 1 tablet (500 mg total) by mouth daily. 01/10/16 01/19/16  Olen Cordial, NP  mirtazapine (REMERON) 15 MG tablet Take 0.5 tablets by mouth at bedtime as needed.  03/17/13   Historical Provider, MD  OxyCODONE HCl, Abuse Deter, (OXAYDO) 5 MG TABA Take 5 mg by mouth every 4 (four) hours as needed.    Historical Provider, MD  pantoprazole (PROTONIX) 20 MG tablet Take 1 tablet by mouth daily. 03/30/13   Historical Provider, MD  propranolol ER (INDERAL LA) 80 MG 24 hr capsule Take 80 mg by mouth daily. 04/27/15   Historical Provider, MD  sodium chloride (OCEAN) 0.65 % SOLN nasal spray Place 2 sprays into both nostrils every 2 (two) hours while awake. 01/10/16   Olen Cordial, NP  Spacer/Aero-Holding Chambers (AEROCHAMBER PLUS) inhaler Use as instructed 05/29/13   Juanito Doom, MD  tamsulosin (FLOMAX) 0.4 MG CAPS capsule Take 0.4 mg by mouth.    Historical Provider, MD  testosterone enanthate (DELATESTRYL)  200 MG/ML injection Inject 1.3 mg into the muscle every 14 (fourteen) days. For IM use only    Historical Provider, MD  tiZANidine (ZANAFLEX) 4 MG tablet Take 4 mg by mouth at bedtime.    Historical Provider, MD   Meds Ordered and Administered this Visit   Medications  ipratropium-albuterol (DUONEB) 0.5-2.5 (3) MG/3ML nebulizer solution 3 mL (3 mLs Nebulization Given 01/10/16 1237)    BP 140/88 mmHg  Pulse 104  Temp(Src) 97.9 F (36.6 C) (Oral)  Resp 20  Ht 6' (1.829 m)  Wt 192 lb (87.091 kg)  BMI 26.03 kg/m2  SpO2 99% No data found.   Physical Exam  Constitutional: He is oriented to person, place, and time.  Vital signs are normal. He appears well-developed and well-nourished. He is active and cooperative.  Non-toxic appearance. He does not have a sickly appearance. He appears ill. No distress.  HENT:  Head: Normocephalic and atraumatic.  Right Ear: Hearing, external ear and ear canal normal. Tympanic membrane is erythematous and bulging. A middle ear effusion is present.  Left Ear: Hearing, external ear and ear canal normal. Tympanic membrane is erythematous and bulging. A middle ear effusion is present.  Nose: Mucosal edema and rhinorrhea present. No nose lacerations, sinus tenderness, nasal deformity, septal deviation or nasal septal hematoma. No epistaxis.  No foreign bodies. Right sinus exhibits maxillary sinus tenderness. Right sinus exhibits no frontal sinus tenderness. Left sinus exhibits maxillary sinus tenderness. Left sinus exhibits no frontal sinus tenderness.  Mouth/Throat: Uvula is midline and mucous membranes are normal. Mucous membranes are not pale, not dry and not cyanotic. He does not have dentures. No oral lesions. No trismus in the jaw. Normal dentition. No dental abscesses, uvula swelling, lacerations or dental caries. Oropharyngeal exudate, posterior oropharyngeal edema and posterior oropharyngeal erythema present. No tonsillar abscesses.  Uvula and upper palate with exudate concentrated around uvula macular rash; cobblestoning posterior pharynx; bilateral nasal turbinates with edema/erythema clear discharge; bilateral TMs with vasculature excoriated slight erythema air fluid level slight opacity bulging   Eyes: Conjunctivae, EOM and lids are normal. Pupils are equal, round, and reactive to light. Right eye exhibits no chemosis, no discharge, no exudate and no hordeolum. No foreign body present in the right eye. Left eye exhibits no chemosis, no discharge, no exudate and no hordeolum. No foreign body present in the left eye. Right conjunctiva is not injected. Right conjunctiva has no  hemorrhage. Left conjunctiva is not injected. Left conjunctiva has no hemorrhage. No scleral icterus. Right eye exhibits normal extraocular motion and no nystagmus. Left eye exhibits normal extraocular motion and no nystagmus. Right pupil is round and reactive. Left pupil is round and reactive. Pupils are equal.  Neck: Trachea normal. Neck supple. Muscular tenderness present. No tracheal tenderness and no spinous process tenderness present. No rigidity. Decreased range of motion present. No tracheal deviation, no edema and no erythema present. No thyroid mass and no thyromegaly present.    Wearing cervical collar  Cardiovascular: Normal rate, regular rhythm, S1 normal, S2 normal, normal heart sounds and intact distal pulses.  PMI is not displaced.  Exam reveals no gallop and no friction rub.   No murmur heard. Pulmonary/Chest: Effort normal. No stridor. No respiratory distress. He has decreased breath sounds in the right lower field and the left lower field. He has wheezes in the right upper field and the right middle field. He has no rhonchi. He has no rales.  Negative egophany all fields  Abdominal: Soft. He exhibits no  distension.  Musculoskeletal: He exhibits no edema or tenderness.  Lymphadenopathy:       Head (right side): No submental, no submandibular, no tonsillar, no preauricular, no posterior auricular and no occipital adenopathy present.       Head (left side): No submental, no submandibular, no tonsillar, no preauricular, no posterior auricular and no occipital adenopathy present.    He has no cervical adenopathy.       Right cervical: No superficial cervical, no deep cervical and no posterior cervical adenopathy present.      Left cervical: No superficial cervical, no deep cervical and no posterior cervical adenopathy present.  Neurological: He is alert and oriented to person, place, and time. He displays no atrophy and no tremor. No cranial nerve deficit or sensory deficit. He  exhibits normal muscle tone. He displays no seizure activity. Coordination and gait normal. GCS eye subscore is 4. GCS verbal subscore is 5. GCS motor subscore is 6.  Skin: Skin is warm, dry and intact. No abrasion, no bruising, no burn, no ecchymosis, no laceration, no lesion, no petechiae and no rash noted. He is not diaphoretic. No cyanosis or erythema. No pallor. Nails show no clubbing.  Psychiatric: He has a normal mood and affect. His speech is normal and behavior is normal. Judgment and thought content normal. Cognition and memory are normal.  Nursing note and vitals reviewed.   ED Course  Procedures (including critical care time)  Labs Review Labs Reviewed  RAPID INFLUENZA A&B ANTIGENS ([place])    Imaging Review  8938 discussed with patient and spouse emphysema worsened from previous chest xray on file.  BBS CTA wheeze resolved s/p duoneb 17m treatment [place] [phone]  Patient [facility] copy of radiology report [facility] or worsening with plan of care.  Patient and spouse verbalized understanding of information/instructions, agreed with plan of care and had no further questions at this time.  CLINICAL DATA: 60year old male with a history of wheezing and shortness of breath.  EXAM: CHEST - 2 VIEW  COMPARISON: Chest x-ray 04/25/2013, CT chest 03/14/2015  FINDINGS: Cardiomediastinal silhouette unchanged in size and contour. No evidence of pulmonary vascular congestion.  Stigmata of emphysema, with increased retrosternal airspace, flattened hemidiaphragms, increased AP diameter, and hyperinflation on the AP view.  Chronic interstitial opacities, similar to comparison plain film though more pronounced.  No displaced fracture.  Unremarkable appearance of the upper abdomen.  Surgical changes of the cervical region incompletely imaged  IMPRESSION: Changes of emphysema without evidence  of superimposed acute cardiopulmonary disease.  Signed,  JDulcy Fanny WEarleen Newport DO  [facility]  [facility]   Electronically Signed  By: JCorrie MckusickD.O.  On: 01/10/2016 12:58   MDM   1. COPD exacerbation ([place]   2. Acute maxillary sinusitis, recurrence not specified   3. Otitis media with effusion, bilateral   4. Thrush, oral    Patient to take clotrimazole troches five times per day as directed  Avoid intimate oral contact during therapy period.   Exitcare handout on oropharyngeal candidiasis given to patient.  Tylenol '1000mg'$  po prn.  Discussed inhaled steroids can put him at risk for recurrent infection ensure he rinses mouth out after use. RTC if worsening symptoms or new symptoms. Encouraged yogurt intake with active cultures.   Patient verbalized understanding and agreed with plan of care and had no further questions at this time.   P2:  hygiene,  hand washing  Supportive treatment.   No evidence of invasive bacterial infection, non toxic and well hydrated.  This is most likely self limiting viral infection.  I do not see where any further testing or imaging is necessary at this time.   I will suggest supportive care, rest, good hygiene and encourage the patient to take adequate fluids.  The patient is to return to clinic or EMERGENCY ROOM if symptoms worsen or change significantly e.g. ear pain, fever, purulent discharge from ears or bleeding.  Exitcare handout on otitis media with effusion given to patient.  Patient verbalized agreement and understanding of treatment plan.     flonase 1 spray each nostril BID, saline 2 sprays each nostril q2h prn congestion.  levaquin '500mg'$  po daily x 10 days.  No evidence of systemic bacterial infection, non toxic and well hydrated.  I do not see where any further testing or imaging is necessary at this time.   I will suggest supportive care, rest, good hygiene and encourage the patient to take  adequate fluids.  The patient is to return to clinic or EMERGENCY ROOM if symptoms worsen or change significantly.  Exitcare handout on sinusitis given to patient.  Patient verbalized agreement and understanding of treatment plan and had no further questions at this time.   P2:  Hand washing and cover cough  levaquin '500mg'$  po daily x 10 days, tessalon pearles '100mg'$  po TID prn cough, discussed continue dulera and albuterol MDI use as prescribed by pulmonology.  Avoiding oral steroids due to recent cervical surgery.  Stop smoking.  Humidifier use.  Wear mask when outside.  Shower BID.  Bronchitis simple, community acquired, may have started as viral (probably respiratory syncytial, parainfluenza, influenza, or adenovirus), but now evidence of acute purulent bronchitis with resultant bronchial edema and mucus formation.  Viruses are the most common cause of bronchial inflammation in otherwise healthy adults with acute bronchitis.  The appearance of sputum is not predictive of whether a bacterial infection is present.  Purulent sputum is most often caused by viral infections.  There are a small portion of those caused by non-viral agents being Mycoplamsa pneumonia.  Microscopic examination or C&S of sputum in the healthy adult with acute bronchitis is generally not helpful (usually negative or normal respiratory flora) other considerations being cough from upper respiratory tract infections, sinusitis or allergic syndromes (mild asthma or viral pneumonia).  Differential Diagnosis:  reactive airway disease (asthma, allergic aspergillosis (eosinophilia), chronic bronchitis, respiratory infection (Sinusitis, Common cold, pneumonia), congestive heart failure, reflux esophagitis, bronchogenic tumor, aspiration syndromes and/or exposure irritants/tobacco smoke.  In this case, there is no evidence of any invasive bacterial illness.  Most likely viral etiology so will hold on antibiotic treatment.  Advise supportive care  with rest, encourage fluids, good hygiene and watch for any worsening symptoms.  If they were to develop:  come back to the office or go to the emergency room if after hours. Without high fever, severe dyspnea, lack of physical findings or other risk factors, I will hold on CBC at this time.  I discussed that approximately 50% of patients with acute bronchitis have a cough that lasts up to three weeks, and 25% for over a month.  Tylenol, one to two tablets every four hours as needed for fever or myalgias.   No aspirin.  Patient instructed to follow up in one week or sooner if symptoms worsen. Exitcare handout on copd exacerbation and MDI use given to patient.  Spouse and Patient  verbalized agreement and understanding of treatment plan.  P2:  hand washing and cover cough  Usually no specific medical treatment is needed if a virus is causing the sore throat.  The throat most often gets better on its own within 5 to 7 days.  Antibiotic medicine does not cure viral pharyngitis.   For acute pharyngitis caused by bacteria, your healthcare provider will prescribe an antibiotic.  Marland Kitchen Do not smoke.  Marland Kitchen Avoid secondhand smoke and other air pollutants.  . Use a cool mist humidifier to add moisture to the air.  . Get plenty of rest.  . You may want to rest your throat by talking less and eating a diet that is mostly liquid or soft for a day or two.   Marland Kitchen Nonprescription throat lozenges and mouthwashes should help relieve the soreness.   . Gargling with warm saltwater and drinking warm liquids may help.  (You can make a saltwater solution by adding 1/4 teaspoon of salt to 8 ounces, or 240 mL, of warm water.)  . A nonprescription pain reliever such as aspirin, acetaminophen, or ibuprofen may ease general aches and pains.   FOLLOW UP with clinic provider if no improvements in the next 7-10 days.  Patient verbalized understanding of instructions and agreed with plan of care. P2:  Hand washing and diet.    Olen Cordial, NP 01/10/16 1520

## 2016-01-12 ENCOUNTER — Inpatient Hospital Stay (HOSPITAL_BASED_OUTPATIENT_CLINIC_OR_DEPARTMENT_OTHER): Payer: BLUE CROSS/BLUE SHIELD | Admitting: Oncology

## 2016-01-12 ENCOUNTER — Inpatient Hospital Stay: Payer: BLUE CROSS/BLUE SHIELD

## 2016-01-12 ENCOUNTER — Inpatient Hospital Stay: Payer: BLUE CROSS/BLUE SHIELD | Attending: Oncology

## 2016-01-12 VITALS — BP 130/88 | HR 105 | Temp 98.1°F | Resp 16 | Wt 198.2 lb

## 2016-01-12 DIAGNOSIS — Z86010 Personal history of colonic polyps: Secondary | ICD-10-CM | POA: Insufficient documentation

## 2016-01-12 DIAGNOSIS — F17210 Nicotine dependence, cigarettes, uncomplicated: Secondary | ICD-10-CM | POA: Diagnosis not present

## 2016-01-12 DIAGNOSIS — G4733 Obstructive sleep apnea (adult) (pediatric): Secondary | ICD-10-CM

## 2016-01-12 DIAGNOSIS — J449 Chronic obstructive pulmonary disease, unspecified: Secondary | ICD-10-CM

## 2016-01-12 DIAGNOSIS — I7300 Raynaud's syndrome without gangrene: Secondary | ICD-10-CM | POA: Diagnosis not present

## 2016-01-12 DIAGNOSIS — I2510 Atherosclerotic heart disease of native coronary artery without angina pectoris: Secondary | ICD-10-CM

## 2016-01-12 DIAGNOSIS — E785 Hyperlipidemia, unspecified: Secondary | ICD-10-CM | POA: Insufficient documentation

## 2016-01-12 DIAGNOSIS — D751 Secondary polycythemia: Secondary | ICD-10-CM | POA: Diagnosis not present

## 2016-01-12 DIAGNOSIS — I10 Essential (primary) hypertension: Secondary | ICD-10-CM | POA: Diagnosis not present

## 2016-01-12 DIAGNOSIS — M542 Cervicalgia: Secondary | ICD-10-CM | POA: Insufficient documentation

## 2016-01-12 DIAGNOSIS — Z87828 Personal history of other (healed) physical injury and trauma: Secondary | ICD-10-CM | POA: Diagnosis not present

## 2016-01-12 LAB — CBC WITH DIFFERENTIAL/PLATELET
BASOS ABS: 0.1 10*3/uL (ref 0–0.1)
BASOS PCT: 1 %
EOS ABS: 0.1 10*3/uL (ref 0–0.7)
EOS PCT: 1 %
HCT: 49.8 % (ref 40.0–52.0)
Hemoglobin: 16.9 g/dL (ref 13.0–18.0)
Lymphocytes Relative: 14 %
Lymphs Abs: 1.6 10*3/uL (ref 1.0–3.6)
MCH: 30.3 pg (ref 26.0–34.0)
MCHC: 33.9 g/dL (ref 32.0–36.0)
MCV: 89.4 fL (ref 80.0–100.0)
MONO ABS: 0.6 10*3/uL (ref 0.2–1.0)
Monocytes Relative: 5 %
Neutro Abs: 9.3 10*3/uL — ABNORMAL HIGH (ref 1.4–6.5)
Neutrophils Relative %: 79 %
PLATELETS: 245 10*3/uL (ref 150–440)
RBC: 5.57 MIL/uL (ref 4.40–5.90)
RDW: 15.4 % — AB (ref 11.5–14.5)
WBC: 11.6 10*3/uL — AB (ref 3.8–10.6)

## 2016-01-12 NOTE — Progress Notes (Signed)
Patient had recent neck surgery 2 weeks ago and offers no problems regarding his polycythemia.

## 2016-01-18 NOTE — Progress Notes (Signed)
[place]  [phone]) [phone] [phone]) [phone]  ID: Paul Carpenter OB: [birth year]  MR#: [id number]  ERX#:[id number]  Patient Care Team: Kirk Ruths, MD as PCP - General (Internal Medicine)  CHIEF COMPLAINT:  Chief Complaint  Patient presents with  . polycythemia    INTERVAL HISTORY: Patient returns to clinic today for repeat laboratory work, further evaluation, and continuation of phlebotomy.  He is in a neck brace from a recent neck surgery approximately 2 weeks ago. He continues to have mild pain, but otherwise feels well. He has no neurologic complaints. He denies any recent fevers or illnesses. He has no chest pain or shortness of breath. He denies any nausea, vomiting, constipation, or diarrhea. Patient offers no further specific complaints today.   REVIEW OF SYSTEMS:   Review of Systems  Constitutional: Negative.   Respiratory: Negative.  Negative for shortness of breath.   Cardiovascular: Negative.  Negative for chest pain.  Gastrointestinal: Negative.  Negative for abdominal pain, blood in stool and melena.  Genitourinary: Negative.   Musculoskeletal: Positive for neck pain.  Neurological: Negative.     As per HPI. Otherwise, a complete review of systems is negatve.  PAST MEDICAL HISTORY: Past Medical History  Diagnosis Date  . Hypertension   . OSA (obstructive sleep apnea)     has CPAP but does not use  . Hyperlipidemia   . Raynaud disease   . Sleep apnea   . Migraines   . Coronary artery disease   . COPD (chronic obstructive pulmonary disease) ([place])   . Chicken pox   . Ulcer (traumatic) of oral mucosa   . Hemorrhoids   . Raynaud's disease   . Anginal pain ([place])   . Hematochezia   . Hoarseness     PAST SURGICAL HISTORY: Past Surgical History  Procedure Laterality Date  . Cervical discectomy    . Nasal sinus surgery      x 2   . Back surgery    . Skin graft    . Septoplasty    . Cardiac catheterization    .  Colonoscopy    . Colonoscopy N/A 07/25/2015    Procedure: COLONOSCOPY;  Surgeon: Lollie Sails, MD;  Location: [facility] ENDOSCOPY;  Service: Endoscopy;  Laterality: N/A;  . Esophagogastroduodenoscopy N/A 07/25/2015    Procedure: ESOPHAGOGASTRODUODENOSCOPY (EGD);  Surgeon: Lollie Sails, MD;  Location: [facility] ENDOSCOPY;  Service: Endoscopy;  Laterality: N/A;    FAMILY HISTORY Family History  Problem Relation Age of Onset  . Heart disease Paternal Grandmother   . Heart disease Father   . Prostate cancer Father   . Heart attack Maternal Grandfather 22       ADVANCED DIRECTIVES:    HEALTH MAINTENANCE: Social History  Substance Use Topics  . Smoking status: Current Every Day Smoker -- 2.00 packs/day for 45 years    Types: Cigarettes  . Smokeless tobacco: Never Used  . Alcohol Use: 1.2 oz/week    2 Standard drinks or equivalent per week     Colonoscopy:  PAP:  Bone density:  Lipid panel:  Allergies  Allergen Reactions  . Aspirin Other (See Comments)  . Lisinopril Other (See Comments)  . Varenicline Other (See Comments)    Current Outpatient Prescriptions  Medication Sig Dispense Refill  . albuterol (PROAIR HFA) 108 (90 BASE) MCG/ACT inhaler Inhale into the lungs.    Marland Kitchen atorvastatin (LIPITOR) 40 MG tablet Take 1 tablet by mouth daily.    . clopidogrel (PLAVIX) 75 MG tablet  Take 75 mg by mouth daily.    . clotrimazole (MYCELEX) 10 MG troche Take 1 tablet (10 mg total) by mouth 5 (five) times daily. 60 tablet 0  . diazepam (VALIUM) 5 MG tablet Take 5 mg by mouth every 6 (six) hours as needed for anxiety.    . DULERA 200-5 MCG/ACT AERO INHALE 2 PUFFS INTO THE LUNGS 2 (TWO) TIMES DAILY. 13 Inhaler 3  . fluticasone (FLONASE) 50 MCG/ACT nasal spray Place 2 sprays into both nostrils daily. (Patient taking differently: Place 2 sprays into both nostrils as needed. ) 16 g 2  . gabapentin (NEURONTIN) 300 MG capsule Take 600 mg by mouth at bedtime.    . hydrochlorothiazide (MICROZIDE)  12.5 MG capsule Take 1 capsule by mouth daily.    Marland Kitchen levofloxacin (LEVAQUIN) 500 MG tablet Take 1 tablet (500 mg total) by mouth daily. 10 tablet 0  . mirtazapine (REMERON) 15 MG tablet Take 0.5 tablets by mouth at bedtime as needed.     . OxyCODONE HCl, Abuse Deter, (OXAYDO) 5 MG TABA Take 5 mg by mouth every 4 (four) hours as needed.    . pantoprazole (PROTONIX) 20 MG tablet Take 1 tablet by mouth daily.    . propranolol ER (INDERAL LA) 80 MG 24 hr capsule Take 80 mg by mouth daily.  5  . sodium chloride (OCEAN) 0.65 % SOLN nasal spray Place 2 sprays into both nostrils every 2 (two) hours while awake.  0  . Spacer/Aero-Holding Chambers (AEROCHAMBER PLUS) inhaler Use as instructed 1 each 0  . tamsulosin (FLOMAX) 0.4 MG CAPS capsule Take 0.4 mg by mouth.    . testosterone enanthate (DELATESTRYL) 200 MG/ML injection Inject 1.3 mg into the muscle every 14 (fourteen) days. For IM use only     No current facility-administered medications for this visit.    OBJECTIVE: Filed Vitals:   01/12/16 1429  BP: 130/88  Pulse: 105  Temp: 98.1 F (36.7 C)  Resp: 16     Body mass index is 26.87 kg/(m^2).    ECOG FS:0 - Asymptomatic  General: Well-developed, well-nourished, no acute distress. Eyes: anicteric sclera. Neck: Brace noted, well-healing surgical scar.  Lungs: Clear to auscultation bilaterally. Heart: Regular rate and rhythm. No rubs, murmurs, or gallops. Abdomen: Soft, nontender, nondistended. No organomegaly noted, normoactive bowel sounds. Musculoskeletal: No edema, cyanosis, or clubbing. Neuro: Alert, answering all questions appropriately. Cranial nerves grossly intact. Skin: No rashes or petechiae noted. Psych: Normal affect.   LAB RESULTS:  Lab Results  Component Value Date   NA 136 12/25/2012   K 4.0 12/25/2012   CL 102 12/25/2012   CO2 31 12/25/2012   GLUCOSE 86 12/25/2012   BUN 10 12/25/2012   CREATININE 1.00 01/08/2016   CALCIUM 8.9 12/25/2012   GFRNONAA >60  12/25/2012   GFRAA >60 12/25/2012    Lab Results  Component Value Date   WBC 11.6* 01/12/2016   NEUTROABS 9.3* 01/12/2016   HGB 16.9 01/12/2016   HCT 49.8 01/12/2016   MCV 89.4 01/12/2016   PLT 245 01/12/2016     STUDIES: CT screening, Mar 14, 2015.  1. Lung-RADS Category 2, benign appearance or behavior. Continue annual screening with low-dose chest CT without contrast in 12 months 2. Diffuse bronchial wall thickening with emphysema, as above; imaging findings suggestive of underlying COPD. 3. Atherosclerotic disease including LAD and RCA Coronary artery calcification.  ASSESSMENT: Polycythemia.  PLAN:    1.  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  Patient's hemoglobin is  16.9 with goal to maintain his hemoglobin below 17.0.  He does not which to pursue phlebotomy today. Return to clinic in 4 months with repeat laboratory work and further evaluation.  Previously the remainder of his laboratory work, including JAK 2 mutation, was either negative or within normal limits.  2.  Tobacco abuse. CT screening results as above. Repeat in one year. 3.  Colon polyps:  Patient has a personal history of greater then 10 adenomatous polyps on his most recent conoloscopy. He does not know of any family history of increased polyps or colon cancer.  Genetic testing to assess for the APC mutation for FAP or AFAP was negative. Continue colonoscopies as per GI. 4. Neck surgery: Continue treatment per orthopedics.  Patient expressed understanding and was in agreement with this plan. He also understands that He can call clinic at any time with any questions, concerns, or complaints.    [name], MD   01/18/2016 10:18 AM

## 2016-03-01 ENCOUNTER — Telehealth: Payer: Self-pay | Admitting: *Deleted

## 2016-03-01 NOTE — Telephone Encounter (Signed)
Notified patient that his annual  follow up  lung cancer screening low dose CT scan is due. Confirmed that patient is within age range of 55-77, asymptomatic of lung cancer, and no other serious disease processes that would make treatment of lung cancer not possible. The patient is a  Current smoker  with a 113.5 pack year history. The shared decision making visit was completed 03/14/2015. The patient is agreeable for CT scan to be scheduled once insurance approves it.

## 2016-03-05 ENCOUNTER — Other Ambulatory Visit: Payer: Self-pay | Admitting: Family Medicine

## 2016-03-05 ENCOUNTER — Encounter: Payer: Self-pay | Admitting: Family Medicine

## 2016-03-05 DIAGNOSIS — Z87891 Personal history of nicotine dependence: Secondary | ICD-10-CM

## 2016-03-05 HISTORY — DX: Personal history of nicotine dependence: Z87.891

## 2016-03-09 DIAGNOSIS — M7541 Impingement syndrome of right shoulder: Secondary | ICD-10-CM | POA: Insufficient documentation

## 2016-03-09 DIAGNOSIS — M50122 Cervical disc disorder at C5-C6 level with radiculopathy: Secondary | ICD-10-CM | POA: Insufficient documentation

## 2016-03-22 ENCOUNTER — Ambulatory Visit
Admission: RE | Admit: 2016-03-22 | Discharge: 2016-03-22 | Disposition: A | Discharge status: Home / Self Care | Payer: BLUE CROSS/BLUE SHIELD | Source: Ambulatory Visit | Attending: Family Medicine | Admitting: Family Medicine

## 2016-03-22 DIAGNOSIS — R918 Other nonspecific abnormal finding of lung field: Secondary | ICD-10-CM | POA: Insufficient documentation

## 2016-03-22 DIAGNOSIS — I2510 Atherosclerotic heart disease of native coronary artery without angina pectoris: Secondary | ICD-10-CM | POA: Insufficient documentation

## 2016-03-22 DIAGNOSIS — Z87891 Personal history of nicotine dependence: Secondary | ICD-10-CM | POA: Diagnosis present

## 2016-03-22 DIAGNOSIS — I700 Atherosclerosis of aorta: Secondary | ICD-10-CM | POA: Insufficient documentation

## 2016-03-22 DIAGNOSIS — J439 Emphysema, unspecified: Secondary | ICD-10-CM | POA: Diagnosis not present

## 2016-03-23 ENCOUNTER — Other Ambulatory Visit: Payer: Self-pay | Admitting: *Deleted

## 2016-03-23 ENCOUNTER — Other Ambulatory Visit: Payer: Self-pay | Admitting: Unknown Physician Specialty

## 2016-03-23 ENCOUNTER — Telehealth: Payer: Self-pay | Admitting: *Deleted

## 2016-03-23 ENCOUNTER — Encounter: Payer: Self-pay | Admitting: Internal Medicine

## 2016-03-23 DIAGNOSIS — G8929 Other chronic pain: Secondary | ICD-10-CM

## 2016-03-23 DIAGNOSIS — M542 Cervicalgia: Secondary | ICD-10-CM

## 2016-03-23 DIAGNOSIS — M25511 Pain in right shoulder: Principal | ICD-10-CM

## 2016-03-23 DIAGNOSIS — R918 Other nonspecific abnormal finding of lung field: Secondary | ICD-10-CM

## 2016-03-23 NOTE — Telephone Encounter (Signed)
Thanks Shawn.  If he's not already on my schedule, we can get him in in the next several weeks.

## 2016-03-23 NOTE — Telephone Encounter (Signed)
Notified patient of LDCT lung cancer screening results with recommendation for evaluation and treatment of potential infectious process. Also notified of incidental finding noted below. Patient verbalizes understanding. Referral has been made to Dr. Stevenson Clinch, who patient has seen in the past for pulmonary needs. Patient will also be discussed in weekly multidisciplinary thoracic disease conference.  IMPRESSION: 1. Lung-RADS Category 4BS, suspicious. Additional imaging evaluation or consultation with pulmonary medicine or thoracic surgery recommended. Specifically, there are multiple new large aggressive appearing pulmonary nodules predominantly in the upper lungs bilaterally. The appearance and distribution of the new nodules strongly favors an infectious etiology. While any one of these nodules could be neoplastic, that is not favored at this time. Accordingly, clinical evaluation and trial of antimicrobial therapy is recommended at this time, after which a short-term follow-up in 3 months is recommended with repeat low-dose chest CT without contrast (please use the following order, "CT CHEST LCS NODULE FOLLOW-UP W/O CM"). 2. The "S" modifier above refers to potentially clinically significant non lung cancer related findings. Specifically, Atherosclerosis, including left main and 3 vessel coronary artery disease. Please note that although the presence of coronary artery calcium documents the presence of coronary artery disease, the severity of this disease and any potential stenosis cannot be assessed on this non-gated CT examination. Assessment for potential risk factor modification, dietary therapy or pharmacologic therapy may be warranted, if clinically indicated. 3. Mild diffuse bronchial thickening with mild centrilobular and paraseptal emphysema; imaging findings suggestive of underlying COPD. These results were called by telephone at the time of interpretation on 03/23/2016 at 10:06  am to [place], who verbally acknowledged these results.

## 2016-03-31 ENCOUNTER — Other Ambulatory Visit: Payer: Self-pay | Admitting: Neurosurgery

## 2016-03-31 DIAGNOSIS — G8929 Other chronic pain: Secondary | ICD-10-CM

## 2016-03-31 DIAGNOSIS — M25511 Pain in right shoulder: Principal | ICD-10-CM

## 2016-03-31 DIAGNOSIS — M542 Cervicalgia: Secondary | ICD-10-CM

## 2016-04-12 ENCOUNTER — Ambulatory Visit
Admission: RE | Admit: 2016-04-12 | Discharge: 2016-04-12 | Disposition: A | Discharge status: Home / Self Care | Payer: BLUE CROSS/BLUE SHIELD | Source: Ambulatory Visit | Attending: Neurosurgery | Admitting: Neurosurgery

## 2016-04-12 ENCOUNTER — Other Ambulatory Visit: Payer: BLUE CROSS/BLUE SHIELD

## 2016-04-12 DIAGNOSIS — G8929 Other chronic pain: Secondary | ICD-10-CM | POA: Insufficient documentation

## 2016-04-12 DIAGNOSIS — M19011 Primary osteoarthritis, right shoulder: Secondary | ICD-10-CM | POA: Insufficient documentation

## 2016-04-12 DIAGNOSIS — M25511 Pain in right shoulder: Secondary | ICD-10-CM | POA: Diagnosis not present

## 2016-04-12 DIAGNOSIS — M542 Cervicalgia: Secondary | ICD-10-CM | POA: Diagnosis present

## 2016-04-12 DIAGNOSIS — M62511 Muscle wasting and atrophy, not elsewhere classified, right shoulder: Secondary | ICD-10-CM | POA: Diagnosis not present

## 2016-04-12 DIAGNOSIS — M75121 Complete rotator cuff tear or rupture of right shoulder, not specified as traumatic: Secondary | ICD-10-CM | POA: Diagnosis not present

## 2016-04-13 ENCOUNTER — Ambulatory Visit (INDEPENDENT_AMBULATORY_CARE_PROVIDER_SITE_OTHER): Payer: BLUE CROSS/BLUE SHIELD | Admitting: Internal Medicine

## 2016-04-13 ENCOUNTER — Telehealth: Payer: Self-pay

## 2016-04-13 ENCOUNTER — Encounter: Payer: Self-pay | Admitting: Internal Medicine

## 2016-04-13 DIAGNOSIS — J410 Simple chronic bronchitis: Secondary | ICD-10-CM

## 2016-04-13 DIAGNOSIS — R918 Other nonspecific abnormal finding of lung field: Secondary | ICD-10-CM | POA: Diagnosis not present

## 2016-04-13 DIAGNOSIS — J069 Acute upper respiratory infection, unspecified: Secondary | ICD-10-CM | POA: Insufficient documentation

## 2016-04-13 DIAGNOSIS — Z720 Tobacco use: Secondary | ICD-10-CM

## 2016-04-13 DIAGNOSIS — R05 Cough: Secondary | ICD-10-CM

## 2016-04-13 DIAGNOSIS — R059 Cough, unspecified: Secondary | ICD-10-CM

## 2016-04-13 MED ORDER — AMOXICILLIN-POT CLAVULANATE 500-125 MG PO TABS: 500.0000 mg | ORAL_TABLET | Freq: Every day | ORAL | Status: DC

## 2016-04-13 MED ORDER — AMOXICILLIN-POT CLAVULANATE 500-125 MG PO TABS: 500.0000 mg | ORAL_TABLET | Freq: Two times a day (BID) | ORAL | Status: DC

## 2016-04-13 MED ORDER — FLUTICASONE FUROATE-VILANTEROL 200-25 MCG/INH IN AEPB: 1.0000 | INHALATION_SPRAY | Freq: Every day | RESPIRATORY_TRACT | Status: DC

## 2016-04-13 MED ORDER — PREDNISONE 10 MG PO TABS: ORAL_TABLET | ORAL | Status: DC

## 2016-04-13 NOTE — Assessment & Plan Note (Signed)
Recent upper spray tract infection in late May, his findings on the CT scan could be reflecting a postinfectious etiology. However given the shape and groundglass appearance of the lesions especially in the right middle lobe, along with his risk stratification of being a smoker, age, smoking history, this lesion is highly suspicious in the right middle lobe for malignancy. After prolonged discussion with wife and patient, they have decided on a repeat surveillance CT scan in 2 months followed by optimizing COPD treatment and upper story tract treatment. Will plan for repeat CT in 2 months, in the interim will give 2 weeks of Augmentin and prednisone taper.  Plan: -Augmentin 500 mg 1 tab by mouth 14 days -Breo 200/25 one puff daily, gargle and rinse after each use Prednisone taper starting at 40 mg, taper by 5 mg daily -Tobacco avoidance

## 2016-04-13 NOTE — Assessment & Plan Note (Signed)
Chronic cough secondary to continued tobacco use and COPD  For COPD optimization with ICS/LABA, prednisone, antibiotics.  Plan: -Antibiotics, steroid tapering dose starting at 40 taper by 5 mg daily, Augmentin 501 tab by mouth twice a day 14 days -Tobacco avoidance.

## 2016-04-13 NOTE — Addendum Note (Signed)
Addended by: Maryanna Shape A on: 04/13/2016 03:19 PM   Modules accepted: Orders

## 2016-04-13 NOTE — Patient Instructions (Signed)
Follow up with Dr. Stevenson Clinch in: 8 weeks -CT chest with contrast for pulmonary nodules prior to follow-up visit -Breo 200/25, 1 puff daily gargle and rinse after each use -Augmentin 500 mg-one tab by mouth twice a day 2 weeks -Prednisone taper starting at 40 mg taper by 5 mg daily -Avoid any forms of tobacco

## 2016-04-13 NOTE — Assessment & Plan Note (Signed)
She noted high risk for lung malignancy given 2 packs per day 46 years, age greater than 61, location of lesion mainly in the right middle lobe. He has a 12.7 mm lesion in the right middle lobe Bilateral upper lobe scarring  l discussed the results of the CAT scan along with treatment and diagnostic modalities with the patient for over 30 minutes at today's visit. Discussed bronchoscopy with navigation, types of biopsy, risk, benefit, outcomes of the procedure with the patient and his wife. After prolonged discussion they have decided on alternative management which includes being conservative with a repeat CT in 2 months with interim antibiotic treatment for upper respiratory tract infection and steroid treatment for COPD exacerbation. I am in agreement with this plan given that he had a recent bronchitis flare in May when his screening CT was done.  Plan: -Repeat CT with contrast of chest in 2 months -COPD optimization -Treat upper respiratory tract infection.

## 2016-04-13 NOTE — Assessment & Plan Note (Signed)
She still smoking up to 2 packs per day, has not been using any form of inhaler maintenance therapy over the last year. Today we discussed importance of maintenance therapy including ICS/labral combination. His last spirometry showed an FEV1 of 60% with moderate obstruction. Today he is decided to start maintenance treatment, which we agreed upon, Breo 200/25  Plan: -Avoid tobacco as much as possible -Breo 200/25 one puff daily gargle and rinse after each use -Tobacco counseling cessation given

## 2016-04-13 NOTE — Progress Notes (Signed)
Patient ID: Paul Carpenter, male   DOB: [birth year], 60 y.o.   MRN: [id number] Patient seen in the office today and instructed on use of BREO ELLIPTA.  Patient expressed understanding and demonstrated technique.

## 2016-04-13 NOTE — Assessment & Plan Note (Signed)
Tobacco Cessation - Counseling regarding benefits of smoking cessation strategies was provided for more than 12 min. - Educated that at this time smoking- cessation represents the single most important step that patient can take to enhance the length and quality of live. - Educated patient regarding alternatives of behavior interventions, pharmacotherapy including NRT and non-nicotine therapy such, and combinations of both. - Patient at this time:patient will try to quit on his own.

## 2016-04-13 NOTE — Progress Notes (Signed)
MRN# [id number] Paul Carpenter 06/18/1956   CC: Chief Complaint  Patient presents with  . Follow-up    last seen 7/16. had ct 5/13. pt c/o occ sob w/exertion, prod cough w/clear mucus, occ wheezing.       Brief History: Synopsis: Laksh Hinners first saw the [facility] pulmonary clinic in July of 2014 for COPD. Simple spirometry showed a ratio 62%, FEV1 of 2.41 L (60% predicted). He had smoked 2 packs of cigarettes daily since his teenage years and continues to smoke. He has had multiple sinus infections and 2 sinus surgeries in the past.  HPI   05/29/2013 ROV >> Marteze thinks that his cough is actually worse since last visit. He is also experienced hoarseness since the last visit. He notes sinus congestion and green mucus production frequently. He takes Ultram at night it doesn't particularly making sleepy. He doesn't recall coughing frequently throughout the night but he does cough sometimes when he gets up to go the bathroom. He feels like he has a constant build up in his throat and congestion. He is constantly clearing his throat. Unclear to him that this is secondary to the Spiriva. She stopped taking the Spiriva 3 days ago to see if that would help, but unfortunately he still has the symptoms.  07/03/2013 ROV >> Lannie says that his cough is not as productive as it was since the last visit. It is improved a little bit. He still has a cough at night. He still has hoarseness. The Ruthe Mannan has been tolerated well but he can't tell that it made a huge difference in his symptoms. He continues to smoke one and a half to 2 packs of cigarettes daily. He does not think the Wellbutrin has helped with this.  12/06/2013 ROV >> Kelijah says that is worse since the last visit. He is coughing a lot at night and during the day. He has has not been to [facility] like we asked. He has a lot of congestion in his throat. He is having a lot of sinus congestion and drainage. Sometimes this is green in  color. He is not currently taking any sinus sprays or over the counter decongestants. He feels more short of breath with exertion and sometimes at night. He occasionally wakes up gasping for air. He does not use his CPAP machine.  He continues to smoke, he is currently smoking 1.5 packs per day. He is on the road a lot and it is hard to not smoke.  He has been having a lot of chest pain in his right chest under his R chest. It feels like a knife.   Events since last clinic visit: She presents today for follow-up visit after lung cancer screening CAT scan. Since he last saw me, he has stopped using Dulera stating that did not provide much benefit, he's been using his albuterol rescue inhaler maybe 2-3 times per week. He smoking 2 packs of cigarettes per day, He is accompanied today by his wife His lung cancer CT screening showed lung RADS category for BS with multiple lesions in the right upper and left upper lobe largest measuring 1.27 cm in the medial aspect of the right middle lobe. Patient states that he just prior to having this CAT scan that he had a bronchitis flare for about a week making productive yellowish sputum. He had a similar COPD flare did not follow-up with any physician and fall last year. He denies any continuous night sweats over the last 3  months, hemoptysis, sudden weight loss, fevers. I have spent 30 minutes reviewing the CT scan images, management strategy including possibility of bronchoscopy, and overall lifestyle adjustment and modifications with the patient and his wife. At this time they have decided to have a repeat CT in 2 months and in the meantime treat bronchitis with prednisone and antibiotics.     Medication:   Current Outpatient Rx  Name  Route  Sig  Dispense  Refill  . albuterol (PROAIR HFA) 108 (90 BASE) MCG/ACT inhaler   Inhalation   Inhale into the lungs.         Marland Kitchen atorvastatin (LIPITOR) 40 MG tablet   Oral   Take 1 tablet by mouth  daily.         . clopidogrel (PLAVIX) 75 MG tablet   Oral   Take 75 mg by mouth daily.         . diazepam (VALIUM) 5 MG tablet   Oral   Take 5 mg by mouth every 6 (six) hours as needed for anxiety.         . DULERA 200-5 MCG/ACT AERO      INHALE 2 PUFFS INTO THE LUNGS 2 (TWO) TIMES DAILY.   13 Inhaler   3   . fluticasone (FLONASE) 50 MCG/ACT nasal spray   Each Nare   Place 2 sprays into both nostrils daily. Patient taking differently: Place 2 sprays into both nostrils as needed.    16 g   2   . gabapentin (NEURONTIN) 300 MG capsule   Oral   Take 600 mg by mouth at bedtime.         . hydrochlorothiazide (MICROZIDE) 12.5 MG capsule   Oral   Take 1 capsule by mouth daily.         . mirtazapine (REMERON) 15 MG tablet   Oral   Take 0.5 tablets by mouth at bedtime as needed.          . OxyCODONE HCl, Abuse Deter, (OXAYDO) 5 MG TABA   Oral   Take 5 mg by mouth every 4 (four) hours as needed.         . pantoprazole (PROTONIX) 20 MG tablet   Oral   Take 1 tablet by mouth daily.         . propranolol ER (INDERAL LA) 80 MG 24 hr capsule   Oral   Take 80 mg by mouth daily.      5   . sodium chloride (OCEAN) 0.65 % SOLN nasal spray   Each Nare   Place 2 sprays into both nostrils every 2 (two) hours while awake.      0   . Spacer/Aero-Holding Chambers (AEROCHAMBER PLUS) inhaler      Use as instructed   1 each   0   . tamsulosin (FLOMAX) 0.4 MG CAPS capsule   Oral   Take 0.4 mg by mouth.         . testosterone enanthate (DELATESTRYL) 200 MG/ML injection   Intramuscular   Inject 1.3 mg into the muscle every 14 (fourteen) days. For IM use only            Review of Systems: Gen:  Denies  fever, sweats, chills HEENT: Denies blurred vision, double vision, ear pain, eye pain, hearing loss, nose bleeds, sore throat Cvc:  No dizziness, chest pain or heaviness Resp:   Admits to: Cough, nasal congestion, increased shortness of breath, sputum  production Gi: Denies swallowing difficulty, stomach pain,  nausea or vomiting, diarrhea, constipation, bowel incontinence Gu:  Denies bladder incontinence, burning urine Ext:   No Joint pain, stiffness or swelling Skin: No skin rash, easy bruising or bleeding or hives Endoc:  No polyuria, polydipsia , polyphagia or weight change Other:  All other systems negative  Allergies:  Aspirin; Lisinopril; and Varenicline  Physical Examination:  VS: BP 148/90 mmHg  Pulse 90  Ht 6' (1.829 m)  Wt 192 lb (87.091 kg)  BMI 26.03 kg/m2  SpO2 98%  General Appearance: No distress  HEENT: PERRLA, no ptosis, no other lesions noticed Pulmonary:clear upper airway sounds, no wheezes, no crackles.  Cardiovascular:  Normal S1,S2.  No m/r/g.     Abdomen:Exam: Benign, Soft, non-tender, No masses  Skin:   warm, no rashes, no ecchymosis  Extremities: normal, no cyanosis, clubbing, warm with normal capillary refill.    (The following images and results were reviewed by Dr. Stevenson Clinch on 04/13/2016).  CT Chest 03/2016 IMPRESSION: 1. Lung-RADS Category 4BS, suspicious. Additional imaging evaluation or consultation with pulmonary medicine or thoracic surgery recommended. Specifically, there are multiple new large aggressive appearing pulmonary nodules predominantly in the upper lungs bilaterally. The appearance and distribution of the new nodules strongly favors an infectious etiology. While any one of these nodules could be neoplastic, that is not favored at this time. Accordingly, clinical evaluation and trial of antimicrobial therapy is recommended at this time, after which a short-term follow-up in 3 months is recommended with repeat low-dose chest CT without contrast (please use the following order, "CT CHEST LCS NODULE FOLLOW-UP W/O CM"). 2. The "S" modifier above refers to potentially clinically significant non lung cancer related findings. Specifically, Atherosclerosis, including left main and 3 vessel  coronary artery disease. Please note that although the presence of coronary artery calcium documents the presence of coronary artery disease, the severity of this disease and any potential stenosis cannot be assessed on this non-gated CT examination. Assessment for potential risk factor modification, dietary therapy or pharmacologic therapy may be warranted, if clinically indicated. 3. Mild diffuse bronchial thickening with mild centrilobular and paraseptal emphysema; imaging findings suggestive of underlying COPD.  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 Today we discussed importance of maintenance therapy including ICS/labral combination. His last spirometry showed an FEV1 of 60% with moderate obstruction. Today he is decided to start maintenance treatment, which we agreed upon, Breo 200/25  Plan: -Avoid tobacco as much as possible -Breo 200/25 one puff daily gargle and rinse after each use -Tobacco counseling cessation given  Cough Chronic cough secondary to continued tobacco use and COPD  For COPD optimization with ICS/LABA, prednisone, antibiotics.  Plan: -Antibiotics, steroid tapering dose starting at 40 taper by 5 mg daily, Augmentin 501 tab by mouth twice a day 14 days -Tobacco avoidance.  Upper respiratory tract infection Recent upper spray tract infection in late May, his findings on the CT scan could be reflecting a postinfectious etiology. However given the shape and groundglass appearance of the lesions especially in the right middle lobe, along with his risk stratification of being a smoker, age, smoking history, this lesion is highly suspicious in the right middle lobe for malignancy. After prolonged  discussion with wife and patient, they have decided on a repeat surveillance CT scan in 2 months followed  by optimizing COPD treatment and upper story tract treatment. Will plan for repeat CT in 2 months, in the interim will give 2 weeks of Augmentin and prednisone taper.  Plan: -Augmentin 500 mg 1 tab by mouth 14 days -Breo 200/25 one puff daily, gargle and rinse after each use Prednisone taper starting at 40 mg, taper by 5 mg daily -Tobacco avoidance  Pulmonary nodules/lesions, multiple She noted high risk for lung malignancy given 2 packs per day 46 years, age greater than 62, location of lesion mainly in the right middle lobe. He has a 12.7 mm lesion in the right middle lobe Bilateral upper lobe scarring  l discussed the results of the CAT scan along with treatment and diagnostic modalities with the patient for over 30 minutes at today's visit. Discussed bronchoscopy with navigation, types of biopsy, risk, benefit, outcomes of the procedure with the patient and his wife. After prolonged discussion they have decided on alternative management which includes being conservative with a repeat CT in 2 months with interim antibiotic treatment for upper respiratory tract infection and steroid treatment for COPD exacerbation. I am in agreement with this plan given that he had a recent bronchitis flare in May when his screening CT was done.  Plan: -Repeat CT with contrast of chest in 2 months -COPD optimization -Treat upper respiratory tract infection.  Tobacco abuse Tobacco Cessation - Counseling regarding benefits of smoking cessation strategies was provided for more than 12 min. - Educated that at this time smoking- cessation represents the single most important step that patient can take to enhance the length and quality of live. - Educated patient regarding alternatives of behavior interventions, pharmacotherapy including NRT and non-nicotine therapy such, and combinations of both. -  Patient at this time:patient will try to quit on his own.      Updated Medication List Outpatient Encounter Prescriptions as of 04/13/2016  Medication Sig  . albuterol (PROAIR HFA) 108 (90 BASE) MCG/ACT inhaler Inhale into the lungs.  Marland Kitchen atorvastatin (LIPITOR) 40 MG tablet Take 1 tablet by mouth daily.  . clopidogrel (PLAVIX) 75 MG tablet Take 75 mg by mouth daily.  . diazepam (VALIUM) 5 MG tablet Take 5 mg by mouth every 6 (six) hours as needed for anxiety.  . DULERA 200-5 MCG/ACT AERO INHALE 2 PUFFS INTO THE LUNGS 2 (TWO) TIMES DAILY.  . fluticasone (FLONASE) 50 MCG/ACT nasal spray Place 2 sprays into both nostrils daily. (Patient taking differently: Place 2 sprays into both nostrils as needed. )  . gabapentin (NEURONTIN) 300 MG capsule Take 600 mg by mouth at bedtime.  . hydrochlorothiazide (MICROZIDE) 12.5 MG capsule Take 1 capsule by mouth daily.  . mirtazapine (REMERON) 15 MG tablet Take 0.5 tablets by mouth at bedtime as needed.   . OxyCODONE HCl, Abuse Deter, (OXAYDO) 5 MG TABA Take 5 mg by mouth every 4 (four) hours as needed.  . pantoprazole (PROTONIX) 20 MG tablet Take 1 tablet by mouth daily.  . propranolol ER (INDERAL LA) 80 MG 24 hr capsule Take 80 mg by mouth daily.  . sodium chloride (OCEAN) 0.65 % SOLN nasal spray Place 2 sprays into both nostrils every 2 (two) hours while awake.  Marland Kitchen Spacer/Aero-Holding Chambers (AEROCHAMBER PLUS) inhaler Use as instructed  . tamsulosin (FLOMAX) 0.4 MG CAPS capsule Take 0.4 mg by mouth.  . testosterone enanthate (DELATESTRYL) 200 MG/ML injection Inject 1.3 mg into the muscle every 14 (fourteen) days. For IM use only   No facility-administered encounter medications on file as of  04/13/2016.    Orders for this visit: Orders Placed This Encounter  Procedures  . CT Chest W Contrast    With super D    Standing Status: Future     Number of Occurrences:      Standing Expiration Date: 06/13/2017    Order Specific Question:  Reason for Exam  (SYMPTOM  OR DIAGNOSIS REQUIRED)    Answer:  lung nodule    Order Specific Question:  Preferred imaging location?    Answer:  [facility]    Thank  you for the visitation and for allowing  [facility] to assist in the care of your patient. Our recommendations are noted above.  Please contact us if we can be of further service.  Vilinda Boehringer, MD [facility] Office Number: [phone]  Cc: Dr. Frazier Richards

## 2016-04-13 NOTE — Telephone Encounter (Signed)
Called pt to clarify dosage of prednisone taper. Pt. Voiced understanding Nothing further needed.

## 2016-04-14 ENCOUNTER — Inpatient Hospital Stay: Payer: BLUE CROSS/BLUE SHIELD | Attending: Oncology | Admitting: Oncology

## 2016-04-14 VITALS — BP 117/80 | HR 99 | Temp 95.8°F | Resp 18 | Wt 192.5 lb

## 2016-04-14 DIAGNOSIS — R918 Other nonspecific abnormal finding of lung field: Secondary | ICD-10-CM

## 2016-04-14 DIAGNOSIS — I10 Essential (primary) hypertension: Secondary | ICD-10-CM | POA: Insufficient documentation

## 2016-04-14 DIAGNOSIS — I2510 Atherosclerotic heart disease of native coronary artery without angina pectoris: Secondary | ICD-10-CM | POA: Insufficient documentation

## 2016-04-14 DIAGNOSIS — Z79899 Other long term (current) drug therapy: Secondary | ICD-10-CM | POA: Diagnosis not present

## 2016-04-14 DIAGNOSIS — Z8042 Family history of malignant neoplasm of prostate: Secondary | ICD-10-CM | POA: Insufficient documentation

## 2016-04-14 DIAGNOSIS — G4733 Obstructive sleep apnea (adult) (pediatric): Secondary | ICD-10-CM | POA: Insufficient documentation

## 2016-04-14 DIAGNOSIS — J449 Chronic obstructive pulmonary disease, unspecified: Secondary | ICD-10-CM | POA: Diagnosis not present

## 2016-04-14 DIAGNOSIS — D751 Secondary polycythemia: Secondary | ICD-10-CM | POA: Insufficient documentation

## 2016-04-14 DIAGNOSIS — I7300 Raynaud's syndrome without gangrene: Secondary | ICD-10-CM | POA: Diagnosis not present

## 2016-04-14 DIAGNOSIS — Z86010 Personal history of colonic polyps: Secondary | ICD-10-CM | POA: Diagnosis not present

## 2016-04-14 DIAGNOSIS — F17210 Nicotine dependence, cigarettes, uncomplicated: Secondary | ICD-10-CM

## 2016-04-14 DIAGNOSIS — Z7952 Long term (current) use of systemic steroids: Secondary | ICD-10-CM | POA: Diagnosis not present

## 2016-04-14 DIAGNOSIS — F419 Anxiety disorder, unspecified: Secondary | ICD-10-CM

## 2016-04-14 NOTE — Progress Notes (Signed)
Paul Carpenter  [phone]) [phone] [phone]  ID: Paul Carpenter OB: Mar 24, 1956  MR#: [id number]  DUK#:[id number]  Patient Care Team: Kirk Ruths, MD as PCP - General (Internal Medicine)  CHIEF COMPLAINT:  Chief Complaint  Patient presents with  . Lung nodules    INTERVAL HISTORY: Patient returns to clinic today to follow up on lung nodules found on recent CT scan. He currently feels well other than anxiety over CT results. He has seen Dr Stevenson Clinch for Ct results and already has antibiotic and prednisone that he will take for 2 weeks and a follow up CT scan scheduled for July 2017. He also has follow up with Dr Stevenson Clinch for CT results in August 2017. He has no neurologic complaints. He denies any recent fevers or illnesses. He has no chest pain or shortness of breath. He denies any nausea, vomiting, constipation, or diarrhea. Patient offers no further specific complaints today.   REVIEW OF SYSTEMS:   Review of Systems  Constitutional: Negative.   Respiratory: Negative.  Negative for shortness of breath.   Cardiovascular: Negative.  Negative for chest pain.  Gastrointestinal: Negative.  Negative for abdominal pain, blood in stool and melena.  Genitourinary: Negative.   Musculoskeletal: Negative.   Neurological: Negative.   Psychiatric/Behavioral: The patient is nervous/anxious.     As per HPI. Otherwise, a complete review of systems is negatve.  PAST MEDICAL HISTORY: Past Medical History  Diagnosis Date  . Hypertension   . OSA (obstructive sleep apnea)     has CPAP but does not use  . Raynaud disease   . Sleep apnea   . Migraines   . Coronary artery disease   . COPD (chronic obstructive pulmonary disease) ([place])   . Chicken pox   . Ulcer (traumatic) of oral mucosa   . Hemorrhoids   . Raynaud's disease   . Anginal pain ([place])   . Hematochezia   . Hoarseness   . Personal history of tobacco use, presenting hazards to health 03/05/2016     PAST SURGICAL HISTORY: Past Surgical History  Procedure Laterality Date  . Cervical discectomy    . Nasal sinus surgery      x 2   . Back surgery    . Skin graft    . Septoplasty    . Cardiac catheterization    . Colonoscopy    . Colonoscopy N/A 07/25/2015    Procedure: COLONOSCOPY;  Surgeon: Lollie Sails, MD;  Location: [facility] ENDOSCOPY;  Service: Endoscopy;  Laterality: N/A;  . Esophagogastroduodenoscopy N/A 07/25/2015    Procedure: ESOPHAGOGASTRODUODENOSCOPY (EGD);  Surgeon: Lollie Sails, MD;  Location: [facility] ENDOSCOPY;  Service: Endoscopy;  Laterality: N/A;  . Neck surgery      FAMILY HISTORY Family History  Problem Relation Age of Onset  . Heart disease Paternal Grandmother   . Heart disease Father   . Prostate cancer Father   . Heart attack Maternal Grandfather 10       ADVANCED DIRECTIVES:    HEALTH MAINTENANCE: Social History  Substance Use Topics  . Smoking status: Current Every Day Smoker -- 2.00 packs/day for 45 years    Types: Cigarettes  . Smokeless tobacco: Never Used  . Alcohol Use: 1.2 oz/week    2 Standard drinks or equivalent per week     Allergies  Allergen Reactions  . Aspirin Other (See Comments)  . Lisinopril Other (See Comments)  . Varenicline Other (See Comments)    Current Outpatient Prescriptions  Medication Sig  Dispense Refill  . albuterol (PROAIR HFA) 108 (90 BASE) MCG/ACT inhaler Inhale into the lungs.    Marland Kitchen amoxicillin-clavulanate (AUGMENTIN) 500-125 MG tablet Take 1 tablet (500 mg total) by mouth 2 (two) times daily. 28 tablet 0  . atorvastatin (LIPITOR) 40 MG tablet Take 1 tablet by mouth daily.    . clopidogrel (PLAVIX) 75 MG tablet Take 75 mg by mouth daily.    . diazepam (VALIUM) 5 MG tablet Take 5 mg by mouth every 6 (six) hours as needed for anxiety.    . fluticasone (FLONASE) 50 MCG/ACT nasal spray Place 2 sprays into both nostrils daily. (Patient taking differently: Place 2 sprays into both nostrils as needed. )  16 g 2  . fluticasone furoate-vilanterol (BREO ELLIPTA) 200-25 MCG/INH AEPB Inhale 1 puff into the lungs daily. 60 each 3  . gabapentin (NEURONTIN) 300 MG capsule Take 600 mg by mouth at bedtime.    . hydrochlorothiazide (MICROZIDE) 12.5 MG capsule Take 1 capsule by mouth daily.    . mirtazapine (REMERON) 15 MG tablet Take 0.5 tablets by mouth at bedtime as needed.     . OxyCODONE HCl, Abuse Deter, (OXAYDO) 5 MG TABA Take 5 mg by mouth every 4 (four) hours as needed.    . pantoprazole (PROTONIX) 20 MG tablet Take 1 tablet by mouth daily.    . predniSONE (DELTASONE) 10 MG tablet '40mg'$ (4tabs), '35mg'$ (3.5tabs), '30mg'$ (3tabs), '25mg'$ (2.5tabs), '20mg'$ (2tabs) '15mg'$ (1.5tabs) '10mg'$  (1tabs), '5mg'$  (0.5tab) 18 tablet 0  . propranolol ER (INDERAL LA) 80 MG 24 hr capsule Take 80 mg by mouth daily.  5  . sodium chloride (OCEAN) 0.65 % SOLN nasal spray Place 2 sprays into both nostrils every 2 (two) hours while awake.  0  . Spacer/Aero-Holding Chambers (AEROCHAMBER PLUS) inhaler Use as instructed 1 each 0  . testosterone enanthate (DELATESTRYL) 200 MG/ML injection Inject 1.3 mg into the muscle every 14 (fourteen) days. For IM use only     No current facility-administered medications for this visit.    OBJECTIVE: Filed Vitals:   04/14/16 1503  BP: 117/80  Pulse: 99  Temp: 95.8 F (35.4 C)  Resp: 18     Body mass index is 26.1 kg/(m^2).    ECOG FS:0 - Asymptomatic  General: Well-developed, well-nourished, no acute distress. Eyes: anicteric sclera. Lungs: Clear to auscultation bilaterally. Heart: Regular rate and rhythm. No rubs, murmurs, or gallops. Abdomen: Soft, nontender, nondistended. N Musculoskeletal: No edema, cyanosis, or clubbing. Neuro: Alert, answering all questions appropriately.  Skin: No rashes or petechiae noted. Psych: Appears anxious   LAB RESULTS:  Lab Results  Component Value Date   NA 136 12/25/2012   K 4.0 12/25/2012   CL 102 12/25/2012   CO2 31 12/25/2012   GLUCOSE 86 12/25/2012    BUN 10 12/25/2012   CREATININE 1.00 01/08/2016   CALCIUM 8.9 12/25/2012   GFRNONAA >60 12/25/2012   GFRAA >60 12/25/2012    Lab Results  Component Value Date   WBC 11.6* 01/12/2016   NEUTROABS 9.3* 01/12/2016   HGB 16.9 01/12/2016   HCT 49.8 01/12/2016   MCV 89.4 01/12/2016   PLT 245 01/12/2016     STUDIES: CT screening, Mar 22, 2016.  1. Lung-RADS Category 4BS, suspicious. Additional imaging evaluation or consultation with pulmonary medicine or thoracic surgery recommended. Specifically, there are multiple new large aggressive appearing pulmonary nodules predominantly in the upper lungs bilaterally. The appearance and distribution of the new nodules strongly favors an infectious etiology. While any one of these nodules could be neoplastic,  that is not favored at this time. Accordingly, clinical evaluation and trial of antimicrobial therapy is recommended at this time, after which a short-term follow-up in 26month is recommended with repeat low-dose chest CT without contrast   ASSESSMENT: Lung Nodules, Polycythemia.  PLAN:    1.  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 Follow up every 4 months.  Return to clinic in July with repeat laboratory work and further evaluation.  Previously the remainder of his laboratory work, including JAK 2 mutation, was either negative or within normal limits.  2.  Lung Nodules. CT screening results as above. Antibiotic x 2 weeks and repeat in 3 months - per Dr MStevenson Clinch3.  Colon polyps:  Patient has a personal history of greater then 10 adenomatous polyps on his most recent conoloscopy. He does not know of any family history of increased polyps or colon cancer.  Genetic testing to assess for the APC mutation for FAP or AFAP was negative. Continue colonoscopies as per GI. 4. Tobacco Use: CT screening results as above. 5. Anxiety: Treatment as per pcp  Patient expressed understanding and was in agreement with this plan. He also  understands that He can call clinic at any time with any questions, concerns, or complaints.    TMayra Reel NP   04/14/2016 4:18 PM

## 2016-04-14 NOTE — Progress Notes (Signed)
States recently had CT screening and was referred to pulmonologist. Pt is here follow up with Grayland Ormond to discuss results of CT scan as well.

## 2016-04-15 DIAGNOSIS — M5412 Radiculopathy, cervical region: Secondary | ICD-10-CM | POA: Insufficient documentation

## 2016-05-01 DIAGNOSIS — C3490 Malignant neoplasm of unspecified part of unspecified bronchus or lung: Secondary | ICD-10-CM

## 2016-05-01 HISTORY — DX: Malignant neoplasm of unspecified part of unspecified bronchus or lung: C34.90

## 2016-05-10 ENCOUNTER — Telehealth: Payer: Self-pay | Admitting: Internal Medicine

## 2016-05-10 NOTE — Telephone Encounter (Signed)
Pt calling stating he was told by Dr Grayland Ormond to go ahead and schedule a lung biopsy  Please advise.

## 2016-05-10 NOTE — Telephone Encounter (Signed)
VM please advise.

## 2016-05-10 NOTE — Telephone Encounter (Signed)
This is the plan per my last note "l discussed the results of the CAT scan along with treatment and diagnostic modalities with the patient for over 30 minutes at today's visit. Discussed bronchoscopy with navigation, types of biopsy, risk, benefit, outcomes of the procedure with the patient and his wife. After prolonged discussion they have decided on alternative management which includes being conservative with a repeat CT in 2 months with interim antibiotic treatment for upper respiratory tract infection and steroid treatment for COPD exacerbation. I am in agreement with this plan given that he had a recent bronchitis flare in May when his screening CT was done."  At this time I am continuing with the above plan, repeat CT once URI and COPD flare is over, will give me a better evaluation of the lesion in the event I have to perform a biopsy.  Thank you VM

## 2016-05-11 NOTE — Telephone Encounter (Signed)
Pt aware of need to have CT before performing procedure. CT is scheduled for 7/25 & f/u with VM on 8/3. Pt voices understanding. Nothing further needed.

## 2016-05-13 ENCOUNTER — Other Ambulatory Visit: Payer: BLUE CROSS/BLUE SHIELD

## 2016-05-13 ENCOUNTER — Ambulatory Visit: Payer: BLUE CROSS/BLUE SHIELD | Admitting: Oncology

## 2016-05-21 ENCOUNTER — Other Ambulatory Visit: Payer: Self-pay | Admitting: *Deleted

## 2016-05-21 DIAGNOSIS — D751 Secondary polycythemia: Secondary | ICD-10-CM

## 2016-05-25 ENCOUNTER — Ambulatory Visit
Admission: RE | Admit: 2016-05-25 | Discharge: 2016-05-25 | Disposition: A | Discharge status: Home / Self Care | Payer: BLUE CROSS/BLUE SHIELD | Source: Ambulatory Visit | Attending: Internal Medicine | Admitting: Internal Medicine

## 2016-05-25 DIAGNOSIS — R918 Other nonspecific abnormal finding of lung field: Secondary | ICD-10-CM | POA: Insufficient documentation

## 2016-05-25 DIAGNOSIS — J984 Other disorders of lung: Secondary | ICD-10-CM | POA: Diagnosis not present

## 2016-05-25 DIAGNOSIS — I2510 Atherosclerotic heart disease of native coronary artery without angina pectoris: Secondary | ICD-10-CM | POA: Insufficient documentation

## 2016-05-25 DIAGNOSIS — I700 Atherosclerosis of aorta: Secondary | ICD-10-CM | POA: Diagnosis not present

## 2016-05-25 MED ORDER — IOPAMIDOL (ISOVUE-370) INJECTION 76%
75.0000 mL | Freq: Once | INTRAVENOUS | Status: AC | PRN
  Administered 2016-05-25: 75 mL via INTRAVENOUS

## 2016-05-26 ENCOUNTER — Inpatient Hospital Stay: Payer: BLUE CROSS/BLUE SHIELD

## 2016-05-26 ENCOUNTER — Inpatient Hospital Stay: Payer: BLUE CROSS/BLUE SHIELD | Attending: Oncology | Admitting: Oncology

## 2016-05-26 ENCOUNTER — Telehealth: Payer: Self-pay | Admitting: *Deleted

## 2016-05-26 VITALS — BP 141/86 | HR 74

## 2016-05-26 VITALS — BP 138/84 | HR 81 | Temp 96.3°F | Resp 18 | Wt 196.1 lb

## 2016-05-26 DIAGNOSIS — I209 Angina pectoris, unspecified: Secondary | ICD-10-CM | POA: Diagnosis not present

## 2016-05-26 DIAGNOSIS — Z8042 Family history of malignant neoplasm of prostate: Secondary | ICD-10-CM

## 2016-05-26 DIAGNOSIS — D751 Secondary polycythemia: Secondary | ICD-10-CM | POA: Diagnosis not present

## 2016-05-26 DIAGNOSIS — K121 Other forms of stomatitis: Secondary | ICD-10-CM | POA: Diagnosis not present

## 2016-05-26 DIAGNOSIS — I10 Essential (primary) hypertension: Secondary | ICD-10-CM | POA: Insufficient documentation

## 2016-05-26 DIAGNOSIS — K649 Unspecified hemorrhoids: Secondary | ICD-10-CM

## 2016-05-26 DIAGNOSIS — Z8669 Personal history of other diseases of the nervous system and sense organs: Secondary | ICD-10-CM | POA: Insufficient documentation

## 2016-05-26 DIAGNOSIS — Z79899 Other long term (current) drug therapy: Secondary | ICD-10-CM

## 2016-05-26 DIAGNOSIS — G4730 Sleep apnea, unspecified: Secondary | ICD-10-CM | POA: Diagnosis not present

## 2016-05-26 DIAGNOSIS — I7300 Raynaud's syndrome without gangrene: Secondary | ICD-10-CM

## 2016-05-26 DIAGNOSIS — F17210 Nicotine dependence, cigarettes, uncomplicated: Secondary | ICD-10-CM | POA: Insufficient documentation

## 2016-05-26 DIAGNOSIS — F419 Anxiety disorder, unspecified: Secondary | ICD-10-CM | POA: Insufficient documentation

## 2016-05-26 DIAGNOSIS — Z86010 Personal history of colonic polyps: Secondary | ICD-10-CM | POA: Diagnosis not present

## 2016-05-26 DIAGNOSIS — I2510 Atherosclerotic heart disease of native coronary artery without angina pectoris: Secondary | ICD-10-CM | POA: Diagnosis not present

## 2016-05-26 DIAGNOSIS — J449 Chronic obstructive pulmonary disease, unspecified: Secondary | ICD-10-CM | POA: Insufficient documentation

## 2016-05-26 DIAGNOSIS — R911 Solitary pulmonary nodule: Secondary | ICD-10-CM | POA: Diagnosis not present

## 2016-05-26 DIAGNOSIS — R490 Dysphonia: Secondary | ICD-10-CM

## 2016-05-26 LAB — CBC WITH DIFFERENTIAL/PLATELET
Basophils Absolute: 0.1 10*3/uL (ref 0–0.1)
Basophils Relative: 1 %
Eosinophils Absolute: 0.1 10*3/uL (ref 0–0.7)
Eosinophils Relative: 1 %
HCT: 52.7 % — ABNORMAL HIGH (ref 40.0–52.0)
HEMOGLOBIN: 18.5 g/dL — AB (ref 13.0–18.0)
LYMPHS ABS: 1.6 10*3/uL (ref 1.0–3.6)
LYMPHS PCT: 14 %
MCH: 33 pg (ref 26.0–34.0)
MCHC: 35.1 g/dL (ref 32.0–36.0)
MCV: 93.9 fL (ref 80.0–100.0)
Monocytes Absolute: 0.7 10*3/uL (ref 0.2–1.0)
Monocytes Relative: 6 %
NEUTROS ABS: 9.2 10*3/uL — AB (ref 1.4–6.5)
NEUTROS PCT: 78 %
PLATELETS: 220 10*3/uL (ref 150–440)
RBC: 5.62 MIL/uL (ref 4.40–5.90)
RDW: 14.7 % — ABNORMAL HIGH (ref 11.5–14.5)
WBC: 11.7 10*3/uL — AB (ref 3.8–10.6)

## 2016-05-26 NOTE — Progress Notes (Signed)
States is feeling well. Offers no complaints. Has questions for Dr. Grayland Ormond regarding recent "tests."

## 2016-05-26 NOTE — Telephone Encounter (Signed)
Pt informed of results. He also states he seen Dr. Grayland Ormond at the [facility] today and that he is ordering a PET scan and states he was told they would go from there. I informed pt to still keep his f/u with you.

## 2016-05-26 NOTE — Telephone Encounter (Signed)
-----   Message from Vilinda Boehringer, MD sent at 05/26/2016  4:14 PM EDT ----- Regarding: CT Results Please inform patient that his CAT scan results of an reviewed by me. His left upper lobe lesion is still persistent, however there is clearing of some of the lesions in the right side. Given his persistent left upper lobe lesion, we may need to proceed with bronchoscopy and further workup with biopsy. However, this can be discussed in further detail at follow-up visit on August 3.  Thank you  VM

## 2016-05-26 NOTE — Progress Notes (Signed)
[place]  Telephone:(336) [phone] Fax:(336) [phone]  ID: Paul Carpenter OB: Mar 30, 1956  MR#: [id number]  NUU#:[id number]  Patient Care Team: Kirk Ruths, MD as PCP - General (Internal Medicine)  CHIEF COMPLAINT: Left upper lobe pulmonary nodule, secondary polycythemia  INTERVAL HISTORY: Patient returns to clinic today for further evaluation, discussion of his imaging results, and treatment planning. He currently is anxious, but otherwise feels well. He has no neurologic complaints. He denies any recent fevers or illnesses. He has no chest pain, cough, hemoptysis, or shortness of breath. He denies any nausea, vomiting, constipation, or diarrhea. He has no urinary complaints. Patient offers no further specific complaints today.   REVIEW OF SYSTEMS:   Review of Systems  Constitutional: Negative.   Respiratory: Negative.  Negative for shortness of breath.   Cardiovascular: Negative.  Negative for chest pain.  Gastrointestinal: Negative.  Negative for abdominal pain, blood in stool and melena.  Genitourinary: Negative.   Musculoskeletal: Negative.   Neurological: Negative.   Psychiatric/Behavioral: The patient is nervous/anxious.     As per HPI. Otherwise, a complete review of systems is negatve.  PAST MEDICAL HISTORY: Past Medical History:  Diagnosis Date  . Anginal pain ([place])   . Chicken pox   . COPD (chronic obstructive pulmonary disease) ([place])   . Coronary artery disease   . Hematochezia   . Hemorrhoids   . Hoarseness   . Hypertension   . Migraines   . OSA (obstructive sleep apnea)    has CPAP but does not use  . Personal history of tobacco use, presenting hazards to health 03/05/2016  . Raynaud disease   . Raynaud's disease   . Sleep apnea   . Ulcer (traumatic) of oral mucosa     PAST SURGICAL HISTORY: Past Surgical History:  Procedure Laterality Date  . BACK SURGERY    . CARDIAC CATHETERIZATION    . CERVICAL DISCECTOMY    .  COLONOSCOPY    . COLONOSCOPY N/A 07/25/2015   Procedure: COLONOSCOPY;  Surgeon: Lollie Sails, MD;  Location: [facility] ENDOSCOPY;  Service: Endoscopy;  Laterality: N/A;  . ESOPHAGOGASTRODUODENOSCOPY N/A 07/25/2015   Procedure: ESOPHAGOGASTRODUODENOSCOPY (EGD);  Surgeon: Lollie Sails, MD;  Location: [facility] ENDOSCOPY;  Service: Endoscopy;  Laterality: N/A;  . NASAL SINUS SURGERY     x 2   . NECK SURGERY    . SEPTOPLASTY    . SKIN GRAFT      FAMILY HISTORY Family History  Problem Relation Age of Onset  . Heart disease Paternal Grandmother   . Heart disease Father   . Prostate cancer Father   . Heart attack Maternal Grandfather 59       ADVANCED DIRECTIVES:    HEALTH MAINTENANCE: Social History  Substance Use Topics  . Smoking status: Current Every Day Smoker    Packs/day: 2.00    Years: 45.00    Types: Cigarettes  . Smokeless tobacco: Never Used  . Alcohol use 1.2 oz/week    2 Standard drinks or equivalent per week     Allergies  Allergen Reactions  . Aspirin Other (See Comments)  . Lisinopril Other (See Comments)  . Varenicline Other (See Comments)    Current Outpatient Prescriptions  Medication Sig Dispense Refill  . albuterol (PROAIR HFA) 108 (90 BASE) MCG/ACT inhaler Inhale into the lungs.    Marland Kitchen atorvastatin (LIPITOR) 40 MG tablet Take 1 tablet by mouth daily.    . clopidogrel (PLAVIX) 75 MG tablet Take 75 mg by mouth daily.    Marland Kitchen  diazepam (VALIUM) 5 MG tablet Take 5 mg by mouth every 6 (six) hours as needed for anxiety.    . fluticasone (FLONASE) 50 MCG/ACT nasal spray Place 2 sprays into both nostrils daily. (Patient taking differently: Place 2 sprays into both nostrils as needed. ) 16 g 2  . fluticasone furoate-vilanterol (BREO ELLIPTA) 200-25 MCG/INH AEPB Inhale 1 puff into the lungs daily. 60 each 3  . gabapentin (NEURONTIN) 300 MG capsule Take 600 mg by mouth at bedtime.    . hydrochlorothiazide (MICROZIDE) 12.5 MG capsule Take 1 capsule by mouth daily.      . mirtazapine (REMERON) 15 MG tablet Take 0.5 tablets by mouth at bedtime as needed.     . OxyCODONE HCl, Abuse Deter, (OXAYDO) 5 MG TABA Take 5 mg by mouth every 4 (four) hours as needed.    . pantoprazole (PROTONIX) 20 MG tablet Take 1 tablet by mouth daily.    . propranolol ER (INDERAL LA) 80 MG 24 hr capsule Take 80 mg by mouth daily.  5  . sodium chloride (OCEAN) 0.65 % SOLN nasal spray Place 2 sprays into both nostrils every 2 (two) hours while awake.  0  . Spacer/Aero-Holding Chambers (AEROCHAMBER PLUS) inhaler Use as instructed 1 each 0  . testosterone enanthate (DELATESTRYL) 200 MG/ML injection Inject 1.3 mg into the muscle every 14 (fourteen) days. For IM use only     No current facility-administered medications for this visit.     OBJECTIVE: Vitals:   05/26/16 1102  BP: 138/84  Pulse: 81  Resp: 18  Temp: (!) 96.3 F (35.7 C)     Body mass index is 26.6 kg/m.    ECOG FS:0 - Asymptomatic  General: Well-developed, well-nourished, no acute distress. Eyes: anicteric sclera. Lungs: Clear to auscultation bilaterally. Heart: Regular rate and rhythm. No rubs, murmurs, or gallops. Abdomen: Soft, nontender, nondistended. N Musculoskeletal: No edema, cyanosis, or clubbing. Neuro: Alert, answering all questions appropriately.  Skin: No rashes or petechiae noted. Psych: Appears anxious   LAB RESULTS:  Lab Results  Component Value Date   NA 136 12/25/2012   K 4.0 12/25/2012   CL 102 12/25/2012   CO2 31 12/25/2012   GLUCOSE 86 12/25/2012   BUN 10 12/25/2012   CREATININE 1.00 01/08/2016   CALCIUM 8.9 12/25/2012   GFRNONAA >60 12/25/2012   GFRAA >60 12/25/2012    Lab Results  Component Value Date   WBC 11.7 (H) 05/26/2016   NEUTROABS 9.2 (H) 05/26/2016   HGB 18.5 (H) 05/26/2016   HCT 52.7 (H) 05/26/2016   MCV 93.9 05/26/2016   PLT 220 05/26/2016     STUDIES:  ASSESSMENT: Left upper lobe pulmonary nodule, Polycythemia.  PLAN:    1.  Polycythemia: Likely  secondary to heavy tobacco use.  Previously the remainder of his laboratory work, including JAK 2 mutation, was either negative or within normal limits. Proceed with 300 mL phlebotomy today. Goal hemoglobin will be approximately 17.0. 2. Left upper lobe pulmonary nodule: Repeat CT scan revealed resolution of some of his nodules, but the one in the left upper lobe is unchanged in size. We will get a PET scan in the next 1-2 weeks. Given patient's extensive tobacco history, this is highly concerning for malignancy. If PET positive, we will proceed with biopsy or possibly directly with surgery. Case will be discussed with thoracic surgery in the near future. 3.  Colon polyps:  Patient has a personal history of greater then 10 adenomatous polyps on his most recent  conoloscopy. He does not know of any family history of increased polyps or colon cancer.  Genetic testing to assess for the APC mutation for FAP or AFAP was negative. Continue colonoscopies as per GI. 4. Tobacco Use: Patient continues to have only smoke. 5. Anxiety: Treatment as per pcp.  Patient expressed understanding and was in agreement with this plan. He also understands that He can call clinic at any time with any questions, concerns, or complaints.    [name], MD   05/29/2016 9:59 AM

## 2016-05-27 NOTE — Telephone Encounter (Signed)
That  Is correct he should keep the appointment with me.   Thank you  VM

## 2016-05-27 NOTE — Telephone Encounter (Signed)
Pt already informed to keep f/u appt with VM.

## 2016-05-30 NOTE — Progress Notes (Signed)
[place]  [phone] [phone]) [phone]  ID: [name] OB: 1956/06/09  MR#: [id number]  KZS#:[id number]  Patient Care Team: Kirk Ruths, MD as PCP - General (Internal Medicine)  CHIEF COMPLAINT: Left upper lobe pulmonary nodule, secondary polycythemia  INTERVAL HISTORY: Patient returns to clinic today for further evaluation, discussion of his imaging results, and treatment planning. He currently is anxious, but otherwise feels well. He has no neurologic complaints. He denies any recent fevers or illnesses. He has no chest pain, cough, hemoptysis, or shortness of breath. He denies any nausea, vomiting, constipation, or diarrhea. He has no urinary complaints. Patient offers no further specific complaints today.   REVIEW OF SYSTEMS:   Review of Systems  Constitutional: Negative.   Respiratory: Negative.  Negative for shortness of breath.   Cardiovascular: Negative.  Negative for chest pain.  Gastrointestinal: Negative.  Negative for abdominal pain, blood in stool and melena.  Genitourinary: Negative.   Musculoskeletal: Negative.   Neurological: Negative.   Psychiatric/Behavioral: The patient is nervous/anxious.     As per HPI. Otherwise, a complete review of systems is negatve.  PAST MEDICAL HISTORY: Past Medical History:  Diagnosis Date  . Anginal pain ([place])   . Chicken pox   . COPD (chronic obstructive pulmonary disease) ([place])   . Coronary artery disease   . Hematochezia   . Hemorrhoids   . Hoarseness   . Hypertension   . Migraines   . OSA (obstructive sleep apnea)    has CPAP but does not use  . Personal history of tobacco use, presenting hazards to health 03/05/2016  . Raynaud disease   . Raynaud's disease   . Sleep apnea   . Ulcer (traumatic) of oral mucosa     PAST SURGICAL HISTORY: Past Surgical History:  Procedure Laterality Date  . BACK SURGERY    . CARDIAC CATHETERIZATION    . CERVICAL DISCECTOMY    .  COLONOSCOPY    . COLONOSCOPY N/A 07/25/2015   Procedure: COLONOSCOPY;  Surgeon: Lollie Sails, MD;  Location: [facility] ENDOSCOPY;  Service: Endoscopy;  Laterality: N/A;  . ESOPHAGOGASTRODUODENOSCOPY N/A 07/25/2015   Procedure: ESOPHAGOGASTRODUODENOSCOPY (EGD);  Surgeon: Lollie Sails, MD;  Location: [facility] ENDOSCOPY;  Service: Endoscopy;  Laterality: N/A;  . NASAL SINUS SURGERY     x 2   . NECK SURGERY    . SEPTOPLASTY    . SKIN GRAFT      FAMILY HISTORY Family History  Problem Relation Age of Onset  . Heart disease Paternal Grandmother   . Heart disease Father   . Prostate cancer Father   . Heart attack Maternal Grandfather 17       ADVANCED DIRECTIVES:    HEALTH MAINTENANCE: Social History  Substance Use Topics  . Smoking status: Current Every Day Smoker    Packs/day: 2.00    Years: 45.00    Types: Cigarettes  . Smokeless tobacco: Never Used  . Alcohol use 1.2 oz/week    2 Standard drinks or equivalent per week     Allergies  Allergen Reactions  . Aspirin Other (See Comments)  . Lisinopril Other (See Comments)  . Varenicline Other (See Comments)    Current Outpatient Prescriptions  Medication Sig Dispense Refill  . albuterol (PROAIR HFA) 108 (90 BASE) MCG/ACT inhaler Inhale into the lungs.    Marland Kitchen atorvastatin (LIPITOR) 40 MG tablet Take 1 tablet by mouth daily.    . clopidogrel (PLAVIX) 75 MG tablet Take 75 mg by mouth daily.    Marland Kitchen  diazepam (VALIUM) 5 MG tablet Take 5 mg by mouth every 6 (six) hours as needed for anxiety.    . fluticasone (FLONASE) 50 MCG/ACT nasal spray Place 2 sprays into both nostrils daily. (Patient taking differently: Place 2 sprays into both nostrils as needed. ) 16 g 2  . fluticasone furoate-vilanterol (BREO ELLIPTA) 200-25 MCG/INH AEPB Inhale 1 puff into the lungs daily. 60 each 3  . gabapentin (NEURONTIN) 300 MG capsule Take 600 mg by mouth at bedtime.    . hydrochlorothiazide (MICROZIDE) 12.5 MG capsule Take 1 capsule by mouth daily.      . mirtazapine (REMERON) 15 MG tablet Take 0.5 tablets by mouth at bedtime as needed.     . OxyCODONE HCl, Abuse Deter, (OXAYDO) 5 MG TABA Take 5 mg by mouth every 4 (four) hours as needed.    . pantoprazole (PROTONIX) 20 MG tablet Take 1 tablet by mouth daily.    . propranolol ER (INDERAL LA) 80 MG 24 hr capsule Take 80 mg by mouth daily.  5  . sodium chloride (OCEAN) 0.65 % SOLN nasal spray Place 2 sprays into both nostrils every 2 (two) hours while awake.  0  . Spacer/Aero-Holding Chambers (AEROCHAMBER PLUS) inhaler Use as instructed 1 each 0  . testosterone enanthate (DELATESTRYL) 200 MG/ML injection Inject 1.3 mg into the muscle every 14 (fourteen) days. For IM use only     No current facility-administered medications for this visit.     OBJECTIVE: There were no vitals filed for this visit.   There is no height or weight on file to calculate BMI.    ECOG FS:0 - Asymptomatic  General: Well-developed, well-nourished, no acute distress. Eyes: anicteric sclera. Lungs: Clear to auscultation bilaterally. Heart: Regular rate and rhythm. No rubs, murmurs, or gallops. Abdomen: Soft, nontender, nondistended. N Musculoskeletal: No edema, cyanosis, or clubbing. Neuro: Alert, answering all questions appropriately.  Skin: No rashes or petechiae noted. Psych: Appears anxious   LAB RESULTS:  Lab Results  Component Value Date   NA 136 12/25/2012   K 4.0 12/25/2012   CL 102 12/25/2012   CO2 31 12/25/2012   GLUCOSE 86 12/25/2012   BUN 10 12/25/2012   CREATININE 1.00 01/08/2016   CALCIUM 8.9 12/25/2012   GFRNONAA >60 12/25/2012   GFRAA >60 12/25/2012    Lab Results  Component Value Date   WBC 11.7 (H) 05/26/2016   NEUTROABS 9.2 (H) 05/26/2016   HGB 18.5 (H) 05/26/2016   HCT 52.7 (H) 05/26/2016   MCV 93.9 05/26/2016   PLT 220 05/26/2016     STUDIES:  ASSESSMENT: PET positive left upper lobe pulmonary nodule, Secondary polycythemia.  PLAN:    1.  Secondary polycythemia:  Likely secondary to heavy tobacco use.  Previously the remainder of his laboratory work, including JAK 2 mutation, was either negative or within normal limits. Patient had 300 mL phlebotomy last week. Goal hemoglobin will be approximately 17.0. 2. PET positive left upper lobe pulmonary nodule: PET scan results reviewed independently with mild hypermetabolism of suspicious left upper lobe lesion. Patient has appointment with thoracic surgery tomorrow morning at 9 AM for further evaluation. Patient will likely need surgical intervention. No follow-up has been scheduled at this time. Will follow-up with patient once final pathology is resulted.  3.  Colon polyps:  Patient has a personal history of greater then 10 adenomatous polyps on his most recent conoloscopy. He does not know of any family history of increased polyps or colon cancer.  Genetic testing to  assess for the APC mutation for FAP or AFAP was negative. Continue colonoscopies as per GI. 4. Tobacco Use: Patient continues to heavily smoke, but both he and his wife have expressed interest in quitting. 5. Anxiety: Treatment as per pcp.  Approximately 30 minutes was spent in discussion of which greater than 50% was consultation.  Patient expressed understanding and was in agreement with this plan. He also understands that He can call clinic at any time with any questions, concerns, or complaints.    [name], MD   05/30/2016 3:22 PM

## 2016-05-31 ENCOUNTER — Ambulatory Visit
Admission: RE | Admit: 2016-05-31 | Discharge: 2016-05-31 | Disposition: A | Discharge status: Home / Self Care | Payer: BLUE CROSS/BLUE SHIELD | Source: Ambulatory Visit | Attending: Oncology | Admitting: Oncology

## 2016-05-31 ENCOUNTER — Inpatient Hospital Stay (HOSPITAL_BASED_OUTPATIENT_CLINIC_OR_DEPARTMENT_OTHER): Payer: BLUE CROSS/BLUE SHIELD | Admitting: Oncology

## 2016-05-31 VITALS — BP 120/82 | HR 96 | Temp 96.4°F | Resp 17 | Ht 72.0 in | Wt 196.3 lb

## 2016-05-31 DIAGNOSIS — G4730 Sleep apnea, unspecified: Secondary | ICD-10-CM

## 2016-05-31 DIAGNOSIS — J449 Chronic obstructive pulmonary disease, unspecified: Secondary | ICD-10-CM

## 2016-05-31 DIAGNOSIS — Z86010 Personal history of colonic polyps: Secondary | ICD-10-CM

## 2016-05-31 DIAGNOSIS — K649 Unspecified hemorrhoids: Secondary | ICD-10-CM

## 2016-05-31 DIAGNOSIS — F17210 Nicotine dependence, cigarettes, uncomplicated: Secondary | ICD-10-CM

## 2016-05-31 DIAGNOSIS — I7300 Raynaud's syndrome without gangrene: Secondary | ICD-10-CM

## 2016-05-31 DIAGNOSIS — R490 Dysphonia: Secondary | ICD-10-CM

## 2016-05-31 DIAGNOSIS — R911 Solitary pulmonary nodule: Secondary | ICD-10-CM

## 2016-05-31 DIAGNOSIS — Z79899 Other long term (current) drug therapy: Secondary | ICD-10-CM

## 2016-05-31 DIAGNOSIS — I10 Essential (primary) hypertension: Secondary | ICD-10-CM

## 2016-05-31 DIAGNOSIS — D751 Secondary polycythemia: Secondary | ICD-10-CM

## 2016-05-31 DIAGNOSIS — K121 Other forms of stomatitis: Secondary | ICD-10-CM

## 2016-05-31 DIAGNOSIS — I209 Angina pectoris, unspecified: Secondary | ICD-10-CM

## 2016-05-31 DIAGNOSIS — I2510 Atherosclerotic heart disease of native coronary artery without angina pectoris: Secondary | ICD-10-CM

## 2016-05-31 DIAGNOSIS — F419 Anxiety disorder, unspecified: Secondary | ICD-10-CM

## 2016-05-31 DIAGNOSIS — Z8042 Family history of malignant neoplasm of prostate: Secondary | ICD-10-CM

## 2016-05-31 DIAGNOSIS — Z8669 Personal history of other diseases of the nervous system and sense organs: Secondary | ICD-10-CM

## 2016-05-31 LAB — GLUCOSE, CAPILLARY: Glucose-Capillary: 90 mg/dL (ref 65–99)

## 2016-05-31 MED ORDER — FLUDEOXYGLUCOSE F - 18 (FDG) INJECTION
10.3340 | Freq: Once | INTRAVENOUS | Status: AC | PRN
  Administered 2016-05-31: 10.334 via INTRAVENOUS

## 2016-05-31 NOTE — Progress Notes (Signed)
No changes.  Here for PET scan results.

## 2016-06-01 ENCOUNTER — Ambulatory Visit (INDEPENDENT_AMBULATORY_CARE_PROVIDER_SITE_OTHER): Payer: BLUE CROSS/BLUE SHIELD | Admitting: Cardiothoracic Surgery

## 2016-06-01 ENCOUNTER — Ambulatory Visit: Payer: BLUE CROSS/BLUE SHIELD | Admitting: Oncology

## 2016-06-01 ENCOUNTER — Encounter: Payer: Self-pay | Admitting: Cardiothoracic Surgery

## 2016-06-01 ENCOUNTER — Other Ambulatory Visit
Admission: RE | Admit: 2016-06-01 | Discharge: 2016-06-01 | Disposition: A | Discharge status: Home / Self Care | Payer: BLUE CROSS/BLUE SHIELD | Source: Ambulatory Visit | Attending: Cardiothoracic Surgery | Admitting: Cardiothoracic Surgery

## 2016-06-01 VITALS — BP 160/92 | HR 90 | Temp 97.8°F | Ht 72.0 in | Wt 195.0 lb

## 2016-06-01 DIAGNOSIS — R918 Other nonspecific abnormal finding of lung field: Secondary | ICD-10-CM | POA: Diagnosis present

## 2016-06-01 LAB — COMPREHENSIVE METABOLIC PANEL
ALT: 26 U/L (ref 17–63)
ANION GAP: 6 (ref 5–15)
AST: 24 U/L (ref 15–41)
Albumin: 4.2 g/dL (ref 3.5–5.0)
Alkaline Phosphatase: 74 U/L (ref 38–126)
BILIRUBIN TOTAL: 0.8 mg/dL (ref 0.3–1.2)
BUN: 7 mg/dL (ref 6–20)
CALCIUM: 9.3 mg/dL (ref 8.9–10.3)
CO2: 33 mmol/L — ABNORMAL HIGH (ref 22–32)
Chloride: 97 mmol/L — ABNORMAL LOW (ref 101–111)
Creatinine, Ser: 0.9 mg/dL (ref 0.61–1.24)
Glucose, Bld: 93 mg/dL (ref 65–99)
POTASSIUM: 4.3 mmol/L (ref 3.5–5.1)
Sodium: 136 mmol/L (ref 135–145)
TOTAL PROTEIN: 6.9 g/dL (ref 6.5–8.1)

## 2016-06-01 LAB — PROTIME-INR
INR: 0.89
Prothrombin Time: 12 seconds (ref 11.4–15.2)

## 2016-06-01 LAB — APTT: APTT: 26 s (ref 24–36)

## 2016-06-01 NOTE — Patient Instructions (Signed)
Please talk to your primary care doctor to help you stop smoking.  We will call you with your Pulmonary Function Test and CT Guided Biopsy appointment.

## 2016-06-01 NOTE — Progress Notes (Signed)
Patient ID: Paul Carpenter, male   DOB: 1956/10/11, 60 y.o.   MRN: [id number]  Chief Complaint  Patient presents with  . Other    left upper lobe lesion    Referred By Dr. Delight Hoh Reason for Referral left upper lobe mass  HPI Location, Quality, Duration, Severity, Timing, Context, Modifying Factors, Associated Signs and Symptoms.  Paul Carpenter is a 60 y.o. male.  He is a patient of Dr. Christia Reading [name]. 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 Dr. Grayland Ormond believes that this is secondary to his ongoing tobacco use and testosterone usage. He currently smokes between 2 and 3 packs of cigarettes a day and has done so for many years. He is been undergoing phlebotomy routinely for the last 5 years. As part of his evaluation he had a CT screening study performed. That revealed multiple lung lesions and he was treated with a short course of antibiotics and a repeat CT scan several weeks later was performed. All the lesions in the lung improved with the exception of the left upper lobe which was still worrisome for a carcinoma. It measured approximately 1 cm. He then underwent PET scanning which revealed slight uptake in the left upper lobe nodule worrisome for an early bronchogenic carcinoma. The patient states that he has had multiple CT scans in the past for a right upper lobe nodule and ultimately underwent PET scanning at [facility] many years ago. After a long period of surveillance it was felt that this was benign and no additional follow-up was obtained.  He states that he does get short of breath with activities. He gets short of breath walking up one flight of stairs. 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 He denies any recent fevers chills or night sweats. He did recently undergo a neck fusion in February of this year and tolerated that well   Past Medical History:  Diagnosis Date  . Anginal pain ([place])   . Chicken pox   . COPD (chronic  obstructive pulmonary disease) ([place])   . Coronary artery disease   . Hematochezia   . Hemorrhoids   . Hoarseness   . Hypertension   . Migraines   . OSA (obstructive sleep apnea)    has CPAP but does not use  . Personal history of tobacco use, presenting hazards to health 03/05/2016  . Raynaud disease   . Raynaud's disease   . Sleep apnea   . Ulcer (traumatic) of oral mucosa     Past Surgical History:  Procedure Laterality Date  . BACK SURGERY    . CARDIAC CATHETERIZATION    . CERVICAL DISCECTOMY    . COLONOSCOPY    . COLONOSCOPY N/A 07/25/2015   Procedure: COLONOSCOPY;  Surgeon: Lollie Sails, MD;  Location: [facility] ENDOSCOPY;  Service: Endoscopy;  Laterality: N/A;  . ESOPHAGOGASTRODUODENOSCOPY N/A 07/25/2015   Procedure: ESOPHAGOGASTRODUODENOSCOPY (EGD);  Surgeon: Lollie Sails, MD;  [facility] ENDOSCOPY;  Service: Endoscopy;  Laterality: N/A;  . NASAL SINUS SURGERY     x 2   . NECK SURGERY    . SEPTOPLASTY    . SKIN GRAFT      Family History  Problem Relation Age of Onset  . Heart disease Father   . Prostate cancer Father   . Heart disease Paternal Grandmother   . Heart attack Maternal Grandfather 52    Social History Social History  Substance Use Topics  . Smoking status: Current Every  Day Smoker    Packs/day: 2.00    Years: 45.00    Types: Cigarettes  . Smokeless tobacco: Never Used  . Alcohol use 1.2 oz/week    2 Standard drinks or equivalent per week    Allergies  Allergen Reactions  . Aspirin Other (See Comments)  . Lisinopril Other (See Comments)  . Varenicline Other (See Comments)    Current Outpatient Prescriptions  Medication Sig Dispense Refill  . albuterol (PROAIR HFA) 108 (90 BASE) MCG/ACT inhaler Inhale into the lungs.    Marland Kitchen atorvastatin (LIPITOR) 40 MG tablet Take 1 tablet by mouth daily.    . diazepam (VALIUM) 5 MG tablet Take 5 mg by mouth every 6 (six) hours as needed for anxiety.    . fluticasone (FLONASE) 50 MCG/ACT nasal spray  Place 2 sprays into both nostrils daily. (Patient taking differently: Place 2 sprays into both nostrils as needed. ) 16 g 2  . fluticasone furoate-vilanterol (BREO ELLIPTA) 200-25 MCG/INH AEPB Inhale 1 puff into the lungs daily. 60 each 3  . gabapentin (NEURONTIN) 300 MG capsule Take 600 mg by mouth at bedtime.    . hydrochlorothiazide (MICROZIDE) 12.5 MG capsule Take 1 capsule by mouth daily.    . OxyCODONE HCl, Abuse Deter, (OXAYDO) 5 MG TABA Take 5 mg by mouth every 4 (four) hours as needed.    . pantoprazole (PROTONIX) 20 MG tablet Take 1 tablet by mouth daily.    . propranolol ER (INDERAL LA) 80 MG 24 hr capsule Take 80 mg by mouth daily.  5  . sodium chloride (OCEAN) 0.65 % SOLN nasal spray Place 2 sprays into both nostrils every 2 (two) hours while awake.  0  . Spacer/Aero-Holding Chambers (AEROCHAMBER PLUS) inhaler Use as instructed 1 each 0  . testosterone enanthate (DELATESTRYL) 200 MG/ML injection Inject 1.3 mg into the muscle every 14 (fourteen) days. For IM use only     No current facility-administered medications for this visit.       Review of Systems A complete review of systems was asked and was negative except for the following positive findingsChest pain consisting of right upper shoulder chest wall and arm pain. Occasionally irregular heartbeats. Cough and shortness of breath with wheezing.  Blood pressure (!) 160/92, pulse 90, temperature 97.8 F (36.6 C), temperature source Oral, height 6' (1.829 m), weight 195 lb (88.5 kg), SpO2 96 %.  Physical Exam CONSTITUTIONAL:  Pleasant, well-developed, well-nourished, and in no acute distress. EYES: Pupils equal and reactive to light, Sclera non-icteric EARS, NOSE, MOUTH AND THROAT:  The oropharynx was clear.  Dentition is good repair.  Oral mucosa pink and moist. LYMPH NODES:  Lymph nodes in the neck and axillae were normal RESPIRATORY:  Lungs were clear.  Normal respiratory effort without pathologic use of accessory muscles  of respiration CARDIOVASCULAR: Heart was regular without murmurs.  There were no carotid bruits. GI: The abdomen was soft, nontender, and nondistended. There were no palpable masses. There was no hepatosplenomegaly. There were normal bowel sounds in all quadrants. GU:  Rectal deferred.   MUSCULOSKELETAL:  Normal muscle strength and tone.  No clubbing or cyanosis.   SKIN:  There were no pathologic skin lesions.  There were no nodules on palpation. NEUROLOGIC:  Sensation is normal.  Cranial nerves are grossly intact. PSYCH:  Oriented to person, place and time.  Mood and affect are normal.  Data Reviewed Multiple CT scans and PET scans.  I have personally reviewed the patient's imaging, laboratory findings and medical  records.    Assessment    The left upper lobe nodule certainly is worrisome for a malignancy.      Plan    I had a long discussion with the patient regarding the options. I would like to obtain some pulmonary function tests and also a CT-guided needle biopsy. The patient states that he did see Dr. Edwin Dada in the past who had recommended navigational bronchoscopy. If the CT-guided needle biopsy is not possible then a bronchoscopy certainly could be performed. I also reviewed with him the other options including the option of surgery and radiation therapy. He understands and would like [place] to proceed.       Nestor Lewandowsky, MD 06/01/2016, 10:09 AM

## 2016-06-02 ENCOUNTER — Telehealth: Payer: Self-pay | Admitting: Cardiothoracic Surgery

## 2016-06-02 ENCOUNTER — Telehealth: Payer: Self-pay

## 2016-06-02 NOTE — Telephone Encounter (Signed)
Patient of Dr Genevive Bi. Supposed to have a CT guided needle biopsy scheduled. Patient is calling to check on the status of the appointment. Please call and advise. Thank you.

## 2016-06-02 NOTE — Telephone Encounter (Signed)
Called CT Scheduling and asked what was the status of patient's CT Needle Guided biopsy. Vivien Rota stated that Dr. Jacqualyn Posey ([facility] [phone]) called Dr. Genevive Bi office, not sure at what number. However, I requested to speak with Dr. Jacqualyn Posey and Vivien Rota gave me the number that we could reach him at.   I then called patient and spoke with wife since he was mowing his lawn. I told her that Dr. Genevive Bi would need to speak to the Radiologist first and then I will call him once we find out when his CT could be scheduled. Patient's wife understood.

## 2016-06-03 ENCOUNTER — Ambulatory Visit: Payer: BLUE CROSS/BLUE SHIELD | Admitting: Internal Medicine

## 2016-06-03 ENCOUNTER — Telehealth: Payer: Self-pay | Admitting: *Deleted

## 2016-06-03 NOTE — Telephone Encounter (Signed)
I have reviewed the patient's CAT scan and PET/CT. He will need a Bronchoscopy, ENB for LUL lung nodule. Please inform patient that we will setup him for this. No smoking or blood thinners 5 days before the procedure Preop labs - BMP, CBC, PT/INR, EKG Will be Flu[facility] and [facility].   Thank you VM

## 2016-06-03 NOTE — Telephone Encounter (Signed)
-----   Message from Wayna Chalet, [place] sent at 06/03/2016 11:07 AM EDT ----- Regarding: Schedule Good morning Misty. Can you please schedule a navigation bronchoscopy with Dr. Stevenson Clinch for this patient. Thank you.

## 2016-06-03 NOTE — Telephone Encounter (Signed)
Received message to schedule pt's bronch from Dr. Soyla Dryer office. In previous notes from you it states wait until the URI cleared up and repeat CT which was redone on 05/25/16. Please advise on bronch.

## 2016-06-04 NOTE — Telephone Encounter (Signed)
Spoke with pt and informed we are scheduling a bronch. Pt scheduled to come in and discuss bronch per his request while he is waiting to get bronch scheduled. Nothing further needed.

## 2016-06-08 ENCOUNTER — Ambulatory Visit: Payer: BLUE CROSS/BLUE SHIELD | Attending: Cardiothoracic Surgery

## 2016-06-08 DIAGNOSIS — R918 Other nonspecific abnormal finding of lung field: Secondary | ICD-10-CM

## 2016-06-08 DIAGNOSIS — J449 Chronic obstructive pulmonary disease, unspecified: Secondary | ICD-10-CM | POA: Diagnosis not present

## 2016-06-08 NOTE — Progress Notes (Unsigned)
Attempted abg on Paul Carpenter.He became quite upset moving his arm and not cooperating.He refused further attempts

## 2016-06-11 ENCOUNTER — Encounter: Payer: Self-pay | Admitting: Internal Medicine

## 2016-06-11 ENCOUNTER — Ambulatory Visit (INDEPENDENT_AMBULATORY_CARE_PROVIDER_SITE_OTHER): Payer: BLUE CROSS/BLUE SHIELD | Admitting: Internal Medicine

## 2016-06-11 VITALS — BP 142/80 | HR 104 | Ht 72.0 in | Wt 196.8 lb

## 2016-06-11 DIAGNOSIS — Z720 Tobacco use: Secondary | ICD-10-CM | POA: Diagnosis not present

## 2016-06-11 DIAGNOSIS — R911 Solitary pulmonary nodule: Secondary | ICD-10-CM | POA: Diagnosis not present

## 2016-06-11 DIAGNOSIS — F17210 Nicotine dependence, cigarettes, uncomplicated: Secondary | ICD-10-CM

## 2016-06-11 DIAGNOSIS — R05 Cough: Secondary | ICD-10-CM

## 2016-06-11 DIAGNOSIS — R059 Cough, unspecified: Secondary | ICD-10-CM

## 2016-06-11 DIAGNOSIS — J432 Centrilobular emphysema: Secondary | ICD-10-CM | POA: Diagnosis not present

## 2016-06-11 MED ORDER — NICOTINE 21-14-7 MG/24HR TD KIT: 1.0000 | PACK | TRANSDERMAL | 0 refills | Status: DC

## 2016-06-11 NOTE — Assessment & Plan Note (Signed)
Tobacco Cessation - Counseling regarding benefits of smoking cessation strategies was provided for more than 3 min. - Educated that at this time smoking- cessation represents the single most important step that patient can take to enhance the length and quality of live. - Educated patient regarding alternatives of behavior interventions, pharmacotherapy including NRT and non-nicotine therapy such, and combinations of both. - Patient at this time will try to quit  - Nicotine Patch Kit Rx given: 57mg x 2 weeks, 193m x 2 weeks, 7 mcg x 4 weeks.  Please alternate site of patch daily, ie. Right shoulder today, left should tomorrow, etc.

## 2016-06-11 NOTE — Assessment & Plan Note (Signed)
Counseling given. 

## 2016-06-11 NOTE — Patient Instructions (Signed)
Follow up with Dr. Stevenson Clinch in: 2 months - Nicotine Patch Kit Rx given: 28mg x 2 weeks, 149m x 2 weeks, 7 mcg x 4 weeks.  Please alternate site of patch daily, ie. Right shoulder today, left should tomorrow, etc.  - bronchoscopy of LUL  - ENB

## 2016-06-11 NOTE — Progress Notes (Signed)
MRN# [id number] Paul Carpenter [birth year]   CC: Chief Complaint  Patient presents with  . Follow-up    pt here to discuss bronch. breathing is baseline. c/o cont sob w/exertion, prod cough w/clear mucus & occ wheezing.       Brief History: Synopsis: Paul Carpenter first saw the [facility] pulmonary clinic in July of 2014 for COPD. Simple spirometry showed a ratio 62%, FEV1 of 2.41 L (60% predicted). He had smoked 2 packs of cigarettes daily since his teenage years and continues to smoke. He has had multiple sinus infections and 2 sinus surgeries in the past.  HPI   05/29/2013 ROV >> [name] thinks that his cough is actually worse since last visit. He is also experienced hoarseness since the last visit. He notes sinus congestion and green mucus production frequently. He takes Ultram at night it doesn't particularly making sleepy. He doesn't recall coughing frequently throughout the night but he does cough sometimes when he gets up to go the bathroom. He feels like he has a constant build up in his throat and congestion. He is constantly clearing his throat. Unclear to him that this is secondary to the Spiriva. She stopped taking the Spiriva 3 days ago to see if that would help, but unfortunately he still has the symptoms.  07/03/2013 ROV >> [name] says that his cough is not as productive as it was since the last visit. It is improved a little bit. He still has a cough at night. He still has hoarseness. The Ruthe Mannan has been tolerated well but he can't tell that it made a huge difference in his symptoms. He continues to smoke one and a half to 2 packs of cigarettes daily. He does not think the Wellbutrin has helped with this.  12/06/2013 ROV >> [name] says that is worse since the last visit. He is coughing a lot at night and during the day. He has has not been to [facility] like we asked. He has a lot of congestion in his throat. He is having a lot of sinus congestion and drainage.  Sometimes this is green in color. He is not currently taking any sinus sprays or over the counter decongestants. He feels more short of breath with exertion and sometimes at night. He occasionally wakes up gasping for air. He does not use his CPAP machine.  He continues to smoke, he is currently smoking 1.5 packs per day. He is on the road a lot and it is hard to not smoke.  He has been having a lot of chest pain in his right chest under his R chest. It feels like a knife.   Events since last clinic visit: Patient presents today for follow-up visit of his PET CT scan and for further discussion about bronchoscopy/ENB. At his last visit it was noted that he had a significant right upper lobe and left upper lobe pneumonia, follow-up was decided with PET CT scan which she has done. His PET CT now shows a left upper lobe nodule with a SUV equals 1.4 and suspicious for malignancy. He still continues to smoke about 1. 5-2 packs per day. Today's accompanied by his wife. He admits to a chronic morning cough, productive at times. Patient had significant questions about bronchoscopy, complications, treatment options, surgical procedures, imaging studies. I spent about 40 minutes discussing his images, ENB, complications of ENB, yield of a diagnosis with ENB with the patient and his wife     Medication:  Review of Systems: Gen:  Denies  fever, sweats, chills HEENT: Denies blurred vision, double vision, ear pain, eye pain, hearing loss, nose bleeds, sore throat Cvc:  No dizziness, chest pain or heaviness Resp:   Admits to: Cough, nasal congestion, increased shortness of breath, sputum production Gi: Denies swallowing difficulty, stomach pain, nausea or vomiting, diarrhea, constipation, bowel incontinence Gu:  Denies bladder incontinence, burning urine Ext:   No Joint pain, stiffness or swelling Skin: No skin rash, easy bruising or bleeding or hives Endoc:  No polyuria, polydipsia ,  polyphagia or weight change Other:  All other systems negative  Allergies:  Aspirin; Lisinopril; and Varenicline  Physical Examination:  VS: BP (!) 142/80 (BP Location: Left Arm, Cuff Size: Normal)   Pulse (!) 104   Ht 6' (1.829 m)   Wt 196 lb 12.8 oz (89.3 kg)   SpO2 96%   BMI 26.69 kg/m   General Appearance: No distress  HEENT: PERRLA, no ptosis, no other lesions noticed Pulmonary:clear upper airway sounds, no wheezes, no crackles.  Cardiovascular:  Normal S1,S2.  No m/r/g.     Abdomen:Exam: Benign, Soft, non-tender, No masses  Skin:   warm, no rashes, no ecchymosis  Extremities: normal, no cyanosis, clubbing, warm with normal capillary refill.     Radiographic studies: (The following images and results were reviewed by Dr. Stevenson Clinch on 06/11/2016). PET CT 05/31/16  CLINICAL DATA:  Initial treatment strategy for left upper lobe pulmonary nodule.  EXAM: NUCLEAR MEDICINE PET SKULL BASE TO THIGH  TECHNIQUE: 10.3 mCi F-18 FDG was injected intravenously. Full-ring PET imaging was performed from the skull base to thigh after the radiotracer. CT data was obtained and used for attenuation correction and anatomic localization.  FASTING BLOOD GLUCOSE:  Value: 90 mg/dl  COMPARISON:  CT chest dated 05/25/2016  FINDINGS: NECK  No hypermetabolic lymph nodes in the neck.  CHEST  9 x 8 mm nodule in the left upper lobe (series 3/image 77) with max SUV 1.4, unchanged from the recent prior, new from 2016. This appearance is considered worrisome for early primary bronchogenic neoplasm.  Right apical pleural-parenchymal scarring. Additional nodular opacities in the right upper lobe have improved from the prior lung cancer screening study dated 03/22/2016, likely reflecting infection/inflammation.  Mild dependent atelectasis in the bilateral lower lobes. No focal consolidation. Mild emphysematous changes, upper lobe predominant. No pleural effusion or  pneumothorax.  No hypermetabolic thoracic lymphadenopathy.  The heart is normal in size. Atherosclerotic calcifications of the LAD and right coronary artery. Mild atherosclerotic calcifications of the aortic arch.  ABDOMEN/PELVIS  No abnormal hypermetabolic activity within the liver, pancreas, adrenal glands, or spleen.  Atherosclerotic calcifications of the abdominal aorta and branch vessels.  No hypermetabolic lymph nodes in the abdomen or pelvis.  SKELETON  No focal hypermetabolic activity to suggest skeletal metastasis.  IMPRESSION: Mildly hypermetabolic 9 x 8 mm left upper lobe pulmonary nodule, worrisome for early primary bronchogenic neoplasm.  No findings suspicious for metastatic disease.  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     Nodule of left lung Patient with noted high risk for lung malignancy given 2 packs per day 46 years, age greater than 56, location of lesion mainly now mainly in the LUL with SUV 1.4 Bilateral upper lobe scarring  l discussed the results of the CAT scan along with treatment and diagnostic modalities with the patient for over 40 minutes at today's visit.  Discussed bronchoscopy with navigation, types of biopsy, risk, benefit, outcomes of the procedure with the patient and his wife.  Plan: -Bronchoscopy with ENB of left upper lobe lesion  COPD (chronic obstructive pulmonary disease) She still smoking up to 2 packs per day, has not been using any form of inhaler maintenance therapy over the last year. Today we discussed importance of maintenance therapy including ICS/labral combination. His last spirometry showed an FEV1 of 60% with moderate obstruction. Today he is decided to start maintenance treatment, which we agreed upon, Breo 200/25  Plan: -Avoid tobacco as much as possible -Breo 200/25 one puff  daily gargle and rinse after each use -Tobacco counseling cessation given  Cough Chronic cough secondary to continued tobacco use and COPD  For COPD optimization with ICS/LABA, prednisone, antibiotics.  Plan: -Tobacco avoidance.  Cigarette smoker Tobacco Cessation - Counseling regarding benefits of smoking cessation strategies was provided for more than 3 min. - Educated that at this time smoking- cessation represents the single most important step that patient can take to enhance the length and quality of live. - Educated patient regarding alternatives of behavior interventions, pharmacotherapy including NRT and non-nicotine therapy such, and combinations of both. - Patient at this time will try to quit  - Nicotine Patch Kit Rx given: 65mg x 2 weeks, 159m x 2 weeks, 7 mcg x 4 weeks.  Please alternate site of patch daily, ie. Right shoulder today, left should tomorrow, etc.     Tobacco abuse Counseling given.    Updated Medication List Outpatient Encounter Prescriptions as of 06/11/2016  Medication Sig  . albuterol (PROAIR HFA) 108 (90 BASE) MCG/ACT inhaler Inhale into the lungs.  . Marland Kitchentorvastatin (LIPITOR) 40 MG tablet Take 1 tablet by mouth daily.  . diazepam (VALIUM) 5 MG tablet Take 5 mg by mouth every 6 (six) hours as needed for anxiety.  . fluticasone (FLONASE) 50 MCG/ACT nasal spray Place 2 sprays into both nostrils daily. (Patient taking differently: Place 2 sprays into both nostrils as needed. )  . fluticasone furoate-vilanterol (BREO ELLIPTA) 200-25 MCG/INH AEPB Inhale 1 puff into the lungs daily.  . Marland Kitchenabapentin (NEURONTIN) 300 MG capsule Take 600 mg by mouth at bedtime.  . hydrochlorothiazide (MICROZIDE) 12.5 MG capsule Take 1 capsule by mouth daily.  . OxyCODONE HCl, Abuse Deter, (OXAYDO) 5 MG TABA Take 5 mg by mouth every 4 (four) hours as needed.  . pantoprazole (PROTONIX) 20 MG tablet Take 1 tablet by mouth daily.  . propranolol ER (INDERAL LA) 80 MG 24 hr capsule  Take 80 mg by mouth daily.  . sodium chloride (OCEAN) 0.65 % SOLN nasal spray Place 2 sprays into both nostrils every 2 (two) hours while awake.  . Marland Kitchenpacer/Aero-Holding Chambers (AEROCHAMBER PLUS) inhaler Use as instructed  . testosterone enanthate (DELATESTRYL) 200 MG/ML injection Inject 1.3 mg into the muscle every 14 (fourteen) days. For IM use only  . methocarbamol (ROBAXIN) 500 MG tablet TAKE 1 TABLET BY ORAL ROUTE 6 TIMES EVERY DAY   No facility-administered encounter medications on file as of 06/11/2016.     Orders for this visit: No orders of the defined types were placed in this encounter.   Thank  you for the visitation and for allowing  [facility] to assist in the care of your patient. Our recommendations are noted above.  Please contact usKoreaf we can be of further service.  ViVilinda BoehringerMD [facility] Office Number: [phone]: Dr.  Frazier Richards

## 2016-06-11 NOTE — Assessment & Plan Note (Signed)
Chronic cough secondary to continued tobacco use and COPD  For COPD optimization with ICS/LABA, prednisone, antibiotics.  Plan: -Tobacco avoidance.

## 2016-06-11 NOTE — Assessment & Plan Note (Signed)
Patient with noted high risk for lung malignancy given 2 packs per day 46 years, age greater than 82, location of lesion mainly now mainly in the LUL with SUV 1.4 Bilateral upper lobe scarring  l discussed the results of the CAT scan along with treatment and diagnostic modalities with the patient for over 40 minutes at today's visit. Discussed bronchoscopy with navigation, types of biopsy, risk, benefit, outcomes of the procedure with the patient and his wife.  Plan: -Bronchoscopy with ENB of left upper lobe lesion

## 2016-06-11 NOTE — Assessment & Plan Note (Signed)
She still smoking up to 2 packs per day, has not been using any form of inhaler maintenance therapy over the last year. Today we discussed importance of maintenance therapy including ICS/labral combination. His last spirometry showed an FEV1 of 60% with moderate obstruction. Today he is decided to start maintenance treatment, which we agreed upon, Breo 200/25  Plan: -Avoid tobacco as much as possible -Breo 200/25 one puff daily gargle and rinse after each use -Tobacco counseling cessation given

## 2016-06-14 ENCOUNTER — Telehealth: Payer: Self-pay | Admitting: *Deleted

## 2016-06-14 NOTE — Telephone Encounter (Signed)
Pt called to clarify appts and wants to know if needs to get biopsy by bronchoscopy or if only needs to pursue surgery for lung mass. Also pt requests refill of valium of Dr. Grayland Ormond would refill since was originally prescribed by Dr. Leanor Kail. Informed pt that Dr. Grayland Ormond will discuss his case with Dr. Genevive Bi and will call him this week to clarify appts. Pt was informed that will need to ask PCP or Dr. Leanor Kail for refill of valium. Pt verbalized understanding.

## 2016-06-16 ENCOUNTER — Telehealth: Payer: Self-pay | Admitting: Internal Medicine

## 2016-06-16 NOTE — Telephone Encounter (Signed)
Pt is calling asking if we can do the procedure on aug 29 th and or if we had anything sooner Please advise.

## 2016-06-16 NOTE — Telephone Encounter (Signed)
Spoke with pt and informed him if we reschedule then it would put [place] out farther that the 28th. Pt wants to keep the scheduled date. Faxing pt paper information on the bronch per his request. Nothing further needed.

## 2016-06-17 DIAGNOSIS — R918 Other nonspecific abnormal finding of lung field: Secondary | ICD-10-CM

## 2016-06-18 ENCOUNTER — Encounter
Admission: RE | Admit: 2016-06-18 | Discharge: 2016-06-18 | Disposition: A | Discharge status: Home / Self Care | Payer: BLUE CROSS/BLUE SHIELD | Source: Ambulatory Visit | Attending: Internal Medicine | Admitting: Internal Medicine

## 2016-06-18 DIAGNOSIS — Z01810 Encounter for preprocedural cardiovascular examination: Secondary | ICD-10-CM | POA: Diagnosis not present

## 2016-06-18 DIAGNOSIS — Z01812 Encounter for preprocedural laboratory examination: Secondary | ICD-10-CM | POA: Diagnosis present

## 2016-06-18 HISTORY — DX: Cough, unspecified: R05.9

## 2016-06-18 HISTORY — DX: Unspecified rotator cuff tear or rupture of unspecified shoulder, not specified as traumatic: M75.100

## 2016-06-18 HISTORY — DX: Other complications of anesthesia, initial encounter: T88.59XA

## 2016-06-18 HISTORY — DX: Cardiac arrhythmia, unspecified: I49.9

## 2016-06-18 HISTORY — DX: Gastro-esophageal reflux disease without esophagitis: K21.9

## 2016-06-18 HISTORY — DX: Cough: R05

## 2016-06-18 HISTORY — DX: Pneumonia, unspecified organism: J18.9

## 2016-06-18 HISTORY — DX: Reserved for inherently not codable concepts without codable children: IMO0001

## 2016-06-18 HISTORY — DX: Adverse effect of unspecified anesthetic, initial encounter: T41.45XA

## 2016-06-18 HISTORY — DX: Anxiety disorder, unspecified: F41.9

## 2016-06-18 LAB — CBC
HCT: 51.3 % (ref 40.0–52.0)
Hemoglobin: 17.9 g/dL (ref 13.0–18.0)
MCH: 32.9 pg (ref 26.0–34.0)
MCHC: 34.9 g/dL (ref 32.0–36.0)
MCV: 94.2 fL (ref 80.0–100.0)
PLATELETS: 215 10*3/uL (ref 150–440)
RBC: 5.45 MIL/uL (ref 4.40–5.90)
RDW: 14 % (ref 11.5–14.5)
WBC: 10.7 10*3/uL — ABNORMAL HIGH (ref 3.8–10.6)

## 2016-06-18 NOTE — Patient Instructions (Signed)
  Your procedure is scheduled on: 06/28/16 Report to Day Surgery. [facility] SECOND FLOOR To find out your arrival time please call [phone] between 1PM - 3PM on 06/25/16  Remember: Instructions that are not followed completely may result in serious medical risk, up to and including death, or upon the discretion of your surgeon and anesthesiologist your surgery may need to be rescheduled.    __X__ 1. Do not eat food or drink liquids after midnight. No gum chewing or hard candies.     _X___ 2. No Alcohol for 24 hours before or after surgery.   _X___ 3. Do Not Smoke For 24 Hours Prior to Your Surgery.   ____ 4. Bring all medications with you on the day of surgery if instructed.    _X___ 5. Notify your doctor if there is any change in your medical condition     (cold, fever, infections).       Do not wear jewelry, make-up, hairpins, clips or nail polish.  Do not wear lotions, powders, or perfumes. You may wear deodorant.  Do not shave 48 hours prior to surgery. Men may shave face and neck.  Do not bring valuables to the hospital.    [facility] is not responsible for any belongings or valuables.               Contacts, dentures or bridgework may not be worn into surgery.  Leave your suitcase in the car. After surgery it may be brought to your room.  For patients admitted to the hospital, discharge time is determined by your                treatment team.   Patients discharged the day of surgery will not be allowed to drive home.   Please read over the following fact sheets that you were given:   Surgical Site Infection Prevention   __X__ Take these medicines the morning of surgery with A SIP OF WATER:    1.PROPRANOLOL  2. [place] AT BEDTIME 06/27/16 AND AM SURGERY  3.   4.  5.  6.  ____ Fleet Enema (as directed)   ____ Use CHG Soap as directed  _X___ Use inhalers on the day of surgery    AND BRING WITH YOU  ____ Stop metformin 2 days prior to surgery    ____ Take  1/2 of usual insulin dose the night before surgery and none on the morning of surgery.   ____ Stop Coumadin/Plavix/aspirin on   ____ Stop Anti-inflammatories on   ____ Stop supplements until after surgery.    ____ Bring C-Pap to the hospital.

## 2016-06-23 NOTE — Telephone Encounter (Signed)
In his chart the bronchoscopy is scheduled for 8/28 with Dr. Stevenson Clinch.

## 2016-06-23 NOTE — Telephone Encounter (Signed)
Can you call patient to find out when his biopsy is with M[facility]?  Thanks!

## 2016-06-28 ENCOUNTER — Ambulatory Visit: Payer: BLUE CROSS/BLUE SHIELD

## 2016-06-28 ENCOUNTER — Ambulatory Visit
Admission: RE | Admit: 2016-06-28 | Discharge: 2016-06-28 | Disposition: A | Discharge status: Home / Self Care | Payer: BLUE CROSS/BLUE SHIELD | Source: Ambulatory Visit | Attending: Internal Medicine | Admitting: Internal Medicine

## 2016-06-28 ENCOUNTER — Encounter
Admission: RE | Disposition: A | Discharge status: Home / Self Care | Payer: Self-pay | Source: Ambulatory Visit | Attending: Internal Medicine

## 2016-06-28 ENCOUNTER — Ambulatory Visit: Payer: BLUE CROSS/BLUE SHIELD | Admitting: Anesthesiology

## 2016-06-28 DIAGNOSIS — I739 Peripheral vascular disease, unspecified: Secondary | ICD-10-CM | POA: Insufficient documentation

## 2016-06-28 DIAGNOSIS — R911 Solitary pulmonary nodule: Secondary | ICD-10-CM

## 2016-06-28 DIAGNOSIS — K219 Gastro-esophageal reflux disease without esophagitis: Secondary | ICD-10-CM | POA: Diagnosis not present

## 2016-06-28 DIAGNOSIS — F17200 Nicotine dependence, unspecified, uncomplicated: Secondary | ICD-10-CM | POA: Diagnosis not present

## 2016-06-28 DIAGNOSIS — I7300 Raynaud's syndrome without gangrene: Secondary | ICD-10-CM | POA: Insufficient documentation

## 2016-06-28 DIAGNOSIS — Z720 Tobacco use: Secondary | ICD-10-CM | POA: Diagnosis not present

## 2016-06-28 DIAGNOSIS — J449 Chronic obstructive pulmonary disease, unspecified: Secondary | ICD-10-CM | POA: Diagnosis not present

## 2016-06-28 DIAGNOSIS — G4730 Sleep apnea, unspecified: Secondary | ICD-10-CM | POA: Diagnosis not present

## 2016-06-28 DIAGNOSIS — R51 Headache: Secondary | ICD-10-CM | POA: Diagnosis not present

## 2016-06-28 DIAGNOSIS — J984 Other disorders of lung: Secondary | ICD-10-CM | POA: Diagnosis not present

## 2016-06-28 DIAGNOSIS — I25119 Atherosclerotic heart disease of native coronary artery with unspecified angina pectoris: Secondary | ICD-10-CM | POA: Diagnosis not present

## 2016-06-28 DIAGNOSIS — R918 Other nonspecific abnormal finding of lung field: Secondary | ICD-10-CM | POA: Insufficient documentation

## 2016-06-28 HISTORY — PX: ELECTROMAGNETIC NAVIGATION BROCHOSCOPY: SHX5369

## 2016-06-28 SURGERY — ELECTROMAGNETIC NAVIGATION BRONCHOSCOPY
Anesthesia: Choice | Laterality: Left

## 2016-06-28 MED ORDER — MIDAZOLAM HCL 2 MG/2ML IJ SOLN
INTRAMUSCULAR | Status: DC | PRN
  Administered 2016-06-28: 2 mg via INTRAVENOUS

## 2016-06-28 MED ORDER — OXYCODONE HCL 5 MG PO TABS
ORAL_TABLET | ORAL | Status: AC
  Filled 2016-06-28: qty 1

## 2016-06-28 MED ORDER — FENTANYL CITRATE (PF) 100 MCG/2ML IJ SOLN: 25.0000 ug | INTRAMUSCULAR | Status: DC | PRN

## 2016-06-28 MED ORDER — OXYCODONE-ACETAMINOPHEN 5-325 MG PO TABS
ORAL_TABLET | ORAL | Status: AC
  Filled 2016-06-28: qty 1

## 2016-06-28 MED ORDER — ONDANSETRON HCL 4 MG/2ML IJ SOLN: 4.0000 mg | Freq: Once | INTRAMUSCULAR | Status: DC | PRN

## 2016-06-28 MED ORDER — DEXAMETHASONE SODIUM PHOSPHATE 10 MG/ML IJ SOLN
INTRAMUSCULAR | Status: DC | PRN
  Administered 2016-06-28: 10 mg via INTRAVENOUS

## 2016-06-28 MED ORDER — PHENYLEPHRINE HCL 10 MG/ML IJ SOLN
INTRAMUSCULAR | Status: DC | PRN
  Administered 2016-06-28 (×3): 100 ug via INTRAVENOUS
  Administered 2016-06-28: 200 ug via INTRAVENOUS

## 2016-06-28 MED ORDER — LIDOCAINE HCL (CARDIAC) 20 MG/ML IV SOLN
INTRAVENOUS | Status: DC | PRN
Start: 2016-06-28 — End: 2016-06-28
  Administered 2016-06-28: 30 mg via INTRAVENOUS

## 2016-06-28 MED ORDER — ROCURONIUM BROMIDE 100 MG/10ML IV SOLN
INTRAVENOUS | Status: DC | PRN
  Administered 2016-06-28: 30 mg via INTRAVENOUS
  Administered 2016-06-28: 20 mg via INTRAVENOUS

## 2016-06-28 MED ORDER — ONDANSETRON HCL 4 MG/2ML IJ SOLN
INTRAMUSCULAR | Status: DC | PRN
  Administered 2016-06-28: 4 mg via INTRAVENOUS

## 2016-06-28 MED ORDER — FENTANYL CITRATE (PF) 100 MCG/2ML IJ SOLN
INTRAMUSCULAR | Status: DC | PRN
  Administered 2016-06-28: 100 ug via INTRAVENOUS

## 2016-06-28 MED ORDER — BUTAMBEN-TETRACAINE-BENZOCAINE 2-2-14 % EX AERO
1.0000 | INHALATION_SPRAY | Freq: Once | CUTANEOUS | Status: DC
  Filled 2016-06-28: qty 20

## 2016-06-28 MED ORDER — LACTATED RINGERS IV SOLN
INTRAVENOUS | Status: DC
Start: 2016-06-28 — End: 2016-06-28
  Administered 2016-06-28: 12:00:00 via INTRAVENOUS

## 2016-06-28 MED ORDER — SUCCINYLCHOLINE CHLORIDE 20 MG/ML IJ SOLN
INTRAMUSCULAR | Status: DC | PRN
  Administered 2016-06-28: 100 mg via INTRAVENOUS

## 2016-06-28 MED ORDER — OXYCODONE-ACETAMINOPHEN 5-325 MG PO TABS
1.0000 | ORAL_TABLET | Freq: Once | ORAL | Status: AC
  Administered 2016-06-28: 1 via ORAL

## 2016-06-28 MED ORDER — SUGAMMADEX SODIUM 200 MG/2ML IV SOLN
INTRAVENOUS | Status: DC | PRN
  Administered 2016-06-28: 180 mg via INTRAVENOUS

## 2016-06-28 MED ORDER — PROPOFOL 10 MG/ML IV BOLUS
INTRAVENOUS | Status: DC | PRN
  Administered 2016-06-28: 200 mg via INTRAVENOUS

## 2016-06-28 NOTE — Anesthesia Postprocedure Evaluation (Signed)
Anesthesia Post Note  Patient: Paul Carpenter  Procedure(s) Performed: Procedure(s) (LRB): ELECTROMAGNETIC NAVIGATION BRONCHOSCOPY (Left)  Patient location during evaluation: PACU Anesthesia Type: General Level of consciousness: awake and alert Pain management: pain level controlled Vital Signs Assessment: post-procedure vital signs reviewed and stable Respiratory status: spontaneous breathing, nonlabored ventilation, respiratory function stable and patient connected to nasal cannula oxygen Cardiovascular status: blood pressure returned to baseline and stable Postop Assessment: no signs of nausea or vomiting Anesthetic complications: no    Last Vitals:  Vitals:   06/28/16 1147 06/28/16 1417  BP: 135/89 116/72  Pulse: 78 71  Resp: 16 16  Temp: 36.7 C 36.4 C    Last Pain:  Vitals:   06/28/16 1147  TempSrc: Oral  PainSc: 4                  Molli Barrows

## 2016-06-28 NOTE — Anesthesia Procedure Notes (Signed)
Procedure Name: Intubation Performed by: Jonna Clark Pre-anesthesia Checklist: Patient identified, Patient being monitored, Timeout performed, Emergency Drugs available and Suction available Patient Re-evaluated:Patient Re-evaluated prior to inductionOxygen Delivery Method: Circle system utilized Preoxygenation: Pre-oxygenation with 100% oxygen Intubation Type: IV induction Ventilation: Mask ventilation without difficulty Laryngoscope Size: Mac and 3 Grade View: Grade I Tube type: Oral Tube size: 8.5 mm Number of attempts: 1 Airway Equipment and Method: Stylet and LTA kit utilized Placement Confirmation: ETT inserted through vocal cords under direct vision,  positive ETCO2 and breath sounds checked- equal and bilateral Secured at: 21 cm Tube secured with: Tape Dental Injury: Teeth and Oropharynx as per pre-operative assessment

## 2016-06-28 NOTE — H&P (Signed)
Patient seen and examined, H&P per clinic note on 06/11/16. No significant changes. Images reviewed, and procedure(s) planned accordingly.  A:60 yo smoker with LUL lung lesion, persistent.   P: 1. ENB with biopsy of the LUL lesion     I, the ordering provider, attest that I have discussed with the patient the benefits, risks, side effects, alternatives, likelihood of achieving goals and potential problems during recovery for the procedure that I have provided informed consent.   Vilinda Boehringer, MD [facility] Pager [phone] (please enter 7-digits) On Call Pager - [phone] (please enter 7-digits) Clinic - [phone]

## 2016-06-28 NOTE — Transfer of Care (Signed)
Immediate Anesthesia Transfer of Care Note  Patient: Paul Carpenter  Procedure(s) Performed: Procedure(s): ELECTROMAGNETIC NAVIGATION BRONCHOSCOPY (Left)  Patient Location: PACU  Anesthesia Type:General  Level of Consciousness: sedated and responds to stimulation  Airway & Oxygen Therapy: Patient Spontanous Breathing and Patient connected to face mask oxygen  Post-op Assessment: Report given to RN and Post -op Vital signs reviewed and stable  Post vital signs: Reviewed and stable  Last Vitals:  Vitals:   06/28/16 1147 06/28/16 1417  BP: 135/89 116/72  Pulse: 78 71  Resp: 16 16  Temp: 36.7 C 36.4 C    Last Pain:  Vitals:   06/28/16 1147  TempSrc: Oral  PainSc: 4          Complications: No apparent anesthesia complications

## 2016-06-28 NOTE — Procedures (Addendum)
Date: 06/28/2016, '@1407'$    PHYSICIAN:  Ahlayah Tarkowski  Indications/Preliminary Diagnosis:   Consent: (Place X beside choice/s below)  The benefits, risks and possible complications of the procedure were        explained to:  _x__ patient  ___ patient's family  ___ other:___________  who verbalized understanding and gave:  ___ verbal  ___ written  __x_ verbal and written  ___ telephone  ___ other:________ consent.      Unable to obtain consent; procedure performed on emergent basis.     Other:       PRESEDATION ASSESSMENT: History and Physical has been performed. Patient meds and allergies have been reviewed. Presedation airway examination has been performed and documented. Baseline vital signs, sedation score, oxygenation status, and cardiac rhythm were reviewed. Patient was deemed to be in satisfactory condition to undergo the procedure.  PREMEDICATIONS:   Sedative/Narcotic Amt Dose   Versed  mg   Fentanyl  mcg  Diprivan  mg        Airway Prep (Place X beside choice below)   1% Transtracheal Lidocaine Anesthetization 7 cc   Patient prepped per Bronchoscopy Lab Policy       Insertion Route (Place X beside choice below)   Nasal   Oral  x Endotracheal Tube   Tracheostomy   INTRAPROCEDURE MEDICATIONS:  Sedative/Narcotic Amt Dose   Versed  mg   Fentanyl  mcg  Diprivan  mg       Medication Amt Dose  Medication Amt Dose  Xylocaine 2%  cc  Epinephrine 1:10,000 sol  cc  Xylocaine 4%  cc  Cocaine  cc   TECHNICAL PROCEDURES: (Place X beside choice below)   Procedures  Description    None     Electrocautery     Cryotherapy     Balloon Dilatation     Bronchography     Stent Placement   x  Therapeutic Aspiration     Laser/Argon Plasma    Brachytherapy Catheter Placement    Foreign Body Removal     SPECIMENS (Sites): (Place X beside choice below)  Specimens Description   No Specimens Obtained     Washings    Lavage   x Biopsies   x Fine Needle Aspirates    Brushings    Sputum    FINDINGS: see below  ESTIMATED BLOOD LOSS: <2IZ  COMPLICATIONS/RESOLUTION: none  PROCEDURE DETAILS: Timeout performed and correct patient, name, & ID confirmed. Following prep per Pulmonary policy, appropriate sedation was administered.  Airway exam proceeded with findings, technical procedures, and specimen collection as noted below. At the end of exam the scope was withdrawn without incident. Impression and Plan as noted below.   Inspection: Right airways - no endobronchial lesions, moderate, thick white sputum noted in RMS and RBI Left airways - no endobronchial lesions, LUL mucosa with moderate erythema  After inspection, attention was turned to the navigational portion of the procedure. The patient was already set on the ENB board and pre-procedure planning had been performed using an appropriate chest CT imaging set. Good concordance was noted between software, imaging and patient. Navigation was undertaken to the lesion in the LUL without difficulty.     Procedure: TBBx forceps biopsy x 6 of LUL lesion TBBx triple needle brush x 5 of LUL lesion FNA of LUL lesion x 3 Gencut (FNA) of LUL lesion x 2  IMPLANTED DEVICE(S):none  IMPRESSION:POST-PROCEDURE DX: LUL nodule  RECOMMENDATION/PLAN: follow up path results    ADDITIONAL COMMENTS:none   Procedure  Time:40 mins   Vilinda Boehringer, MD  Pulmonary and Critical Care Pager : [phone] (Please enter 7 digits) On call pager [phone] (please enter 7-digits) Clinic - [phone]

## 2016-06-28 NOTE — Anesthesia Preprocedure Evaluation (Signed)
Anesthesia Evaluation  Patient identified by MRN, date of birth, ID band Patient awake    Reviewed: Allergy & Precautions, H&P , NPO status , Patient's Chart, lab work & pertinent test results, reviewed documented beta blocker date and time   History of Anesthesia Complications (+) history of anesthetic complications  Airway Mallampati: III  TM Distance: >3 FB Neck ROM: limited    Dental  (+) Teeth Intact   Pulmonary neg pulmonary ROS, shortness of breath and with exertion, sleep apnea and Continuous Positive Airway Pressure Ventilation , pneumonia, resolved, COPD, Current Smoker,    Pulmonary exam normal        Cardiovascular hypertension, + angina with exertion + CAD and + Peripheral Vascular Disease  negative cardio ROS Normal cardiovascular exam(-) dysrhythmias  Rhythm:regular Rate:Normal     Neuro/Psych  Headaches, negative neurological ROS  negative psych ROS   GI/Hepatic negative GI ROS, Neg liver ROS, GERD  Medicated,  Endo/Other  negative endocrine ROS  Renal/GU negative Renal ROS  negative genitourinary   Musculoskeletal   Abdominal   Peds  Hematology negative hematology ROS (+)   Anesthesia Other Findings Past Medical History: No date: Anginal pain ([place]) No date: Anxiety No date: Chicken pox No date: Complication of anesthesia     Comment: o2 dropped after neck fusion No date: COPD (chronic obstructive pulmonary disease) (* No date: Coronary artery disease No date: Cough     Comment: chronic  clear phlegm No date: Dysrhythmia No date: GERD (gastroesophageal reflux disease)     Comment: h/o reflux/ hoarsness No date: Hematochezia No date: Hemorrhoids No date: Hoarseness No date: Hypertension No date: Migraines No date: OSA (obstructive sleep apnea)     Comment: has CPAP but does not use 03/05/2016: Personal history of tobacco use, presenting ha* No date: Pneumonia     Comment: 5/17 No date:  Raynaud disease No date: Raynaud's disease No date: Rotator cuff tear     Comment: on right No date: Shortness of breath dyspnea No date: Sleep apnea No date: Ulcer (traumatic) of oral mucosa Past Surgical History: No date: BACK SURGERY     Comment: cervical fusion x 2 No date: CARDIAC CATHETERIZATION No date: CERVICAL DISCECTOMY No date: COLONOSCOPY 07/25/2015: COLONOSCOPY N/A     Comment: Procedure: COLONOSCOPY;  Surgeon: Lollie Sails, MD;  Location: [facility] ENDOSCOPY;                Service: Endoscopy;  Laterality: N/A; 07/25/2015: ESOPHAGOGASTRODUODENOSCOPY N/A     Comment: Procedure: ESOPHAGOGASTRODUODENOSCOPY (EGD);                Surgeon: Lollie Sails, MD;  Location: [facility]              ENDOSCOPY;  Service: Endoscopy;  Laterality:               N/A; No date: NASAL SINUS SURGERY     Comment: x 2  No date: NECK SURGERY No date: SEPTOPLASTY No date: SKIN GRAFT   Reproductive/Obstetrics negative OB ROS                             Anesthesia Physical Anesthesia Plan  ASA: IV  Anesthesia Plan: General ETT   Post-op Pain Management:    Induction:   Airway Management Planned:   Additional Equipment:   Intra-op Plan:   Post-operative Plan:  Informed Consent: I have reviewed the patients History and Physical, chart, labs and discussed the procedure including the risks, benefits and alternatives for the proposed anesthesia with the patient or authorized representative who has indicated his/her understanding and acceptance.   Dental Advisory Given  Plan Discussed with: CRNA  Anesthesia Plan Comments:         Anesthesia Quick Evaluation

## 2016-06-29 ENCOUNTER — Encounter: Payer: Self-pay | Admitting: Internal Medicine

## 2016-06-29 NOTE — OR Nursing (Signed)
Post op phone call done.  No answer.

## 2016-06-30 LAB — CYTOLOGY - NON PAP

## 2016-06-30 LAB — SURGICAL PATHOLOGY

## 2016-07-06 NOTE — Progress Notes (Signed)
[facility]  [phone] [phone]  ID: Paul Carpenter OB: 06/16/1956  MR#: [id number]  CSN#:[id number]  Patient Care Team: Marshall W Anderson, MD as PCP - General (Internal Medicine)  CHIEF COMPLAINT: Left upper lobe pulmonary nodule, secondary polycythemia  INTERVAL HISTORY: Patient returns to clinic today for further evaluation, discussion of his pathology results, and consideration of phlebotomy. He continues to be anxious, but otherwise feels well. He has no neurologic complaints. He denies any recent fevers or illnesses. He has no chest pain, cough, hemoptysis, or shortness of breath. He denies any nausea, vomiting, constipation, or diarrhea. He has no urinary complaints. Patient offers no further specific complaints today.   REVIEW OF SYSTEMS:   Review of Systems  Constitutional: Negative.  Negative for fever, malaise/fatigue and weight loss.  Respiratory: Negative.  Negative for cough and shortness of breath.   Cardiovascular: Negative.  Negative for chest pain.  Gastrointestinal: Negative.  Negative for abdominal pain, blood in stool and melena.  Genitourinary: Negative.   Musculoskeletal: Negative.   Neurological: Negative.  Negative for weakness.  Psychiatric/Behavioral: The patient is nervous/anxious.     As per HPI. Otherwise, a complete review of systems is negatve.  PAST MEDICAL HISTORY: Past Medical History:  Diagnosis Date  . Anginal pain (HCC)   . Anxiety   . Chicken pox   . Complication of anesthesia    o2 dropped after neck fusion  . COPD (chronic obstructive pulmonary disease) (HCC)   . Coronary artery disease   . Cough    chronic  clear phlegm  . Dysrhythmia   . GERD (gastroesophageal reflux disease)    h/o reflux/ hoarsness  . Hematochezia   . Hemorrhoids   . Hoarseness   . Hypertension   . Migraines   . OSA (obstructive sleep apnea)    has CPAP but does not use  . Personal history of tobacco use,  presenting hazards to health 03/05/2016  . Pneumonia    5/17  . Raynaud disease   . Raynaud's disease   . Rotator cuff tear    on right  . Shortness of breath dyspnea   . Sleep apnea   . Ulcer (traumatic) of oral mucosa     PAST SURGICAL HISTORY: Past Surgical History:  Procedure Laterality Date  . BACK SURGERY     cervical fusion x 2  . CARDIAC CATHETERIZATION    . CERVICAL DISCECTOMY    . COLONOSCOPY    . COLONOSCOPY N/A 07/25/2015   Procedure: COLONOSCOPY;  Surgeon: Martin U Skulskie, MD;  Location: [facility] ENDOSCOPY;  Service: Endoscopy;  Laterality: N/A;  . ELECTROMAGNETIC NAVIGATION BROCHOSCOPY Left 06/28/2016   Procedure: ELECTROMAGNETIC NAVIGATION BRONCHOSCOPY;  Surgeon: Vishal Mungal, MD;  Location: [facility] ORS;  Service: Cardiopulmonary;  Laterality: Left;  . ESOPHAGOGASTRODUODENOSCOPY N/A 07/25/2015   Procedure: ESOPHAGOGASTRODUODENOSCOPY (EGD);  Surgeon: Martin U Skulskie, MD;  Location: [facility] ENDOSCOPY;  Service: Endoscopy;  Laterality: N/A;  . NASAL SINUS SURGERY     x 2   . NECK SURGERY    . SEPTOPLASTY    . SKIN GRAFT      FAMILY HISTORY Family History  Problem Relation Age of Onset  . Heart disease Father   . Prostate cancer Father   . Heart disease Paternal Grandmother   . Heart attack Maternal Grandfather 52       ADVANCED DIRECTIVES:    HEALTH MAINTENANCE: Social History  Substance Use Topics  . Smoking status: Current Every Day Smoker      Packs/day: 2.00    Years: 45.00    Types: Cigarettes  . Smokeless tobacco: Never Used  . Alcohol use 1.2 oz/week    2 Standard drinks or equivalent per week     Allergies  Allergen Reactions  . Aspirin Other (See Comments)  . Lisinopril Other (See Comments)  . Varenicline Other (See Comments)    Current Outpatient Prescriptions  Medication Sig Dispense Refill  . albuterol (PROAIR HFA) 108 (90 BASE) MCG/ACT inhaler Inhale 1-2 puffs into the lungs every 6 (six) hours as needed for wheezing or shortness of  breath.     . atorvastatin (LIPITOR) 40 MG tablet Take 1 tablet by mouth daily.    . diazepam (VALIUM) 5 MG tablet Take 5 mg by mouth every 6 (six) hours as needed for anxiety.    . fluticasone furoate-vilanterol (BREO ELLIPTA) 200-25 MCG/INH AEPB Inhale 1 puff into the lungs daily. 60 each 3  . gabapentin (NEURONTIN) 300 MG capsule Take 600 mg by mouth at bedtime.    . hydrochlorothiazide (MICROZIDE) 12.5 MG capsule Take 1 capsule by mouth daily.    . methocarbamol (ROBAXIN) 500 MG tablet Take 500 mg by mouth every 6 (six) hours as needed for muscle spasms.    . Nicotine 21-14-7 MG/24HR KIT Place 1 kit onto the skin as directed. 56 each 0  . oxyCODONE (OXY IR/ROXICODONE) 5 MG immediate release tablet Take 5 mg by mouth every 4 (four) hours as needed (pain).     . pantoprazole (PROTONIX) 20 MG tablet Take 20 mg by mouth daily.     . propranolol ER (INDERAL LA) 80 MG 24 hr capsule Take 80 mg by mouth daily.  5  . testosterone enanthate (DELATESTRYL) 200 MG/ML injection Inject 1.3 mg into the muscle every 14 (fourteen) days. For IM use only     No current facility-administered medications for this visit.     OBJECTIVE: Vitals:   07/07/16 1050  BP: (!) 154/95  Pulse: 83  Resp: 18  Temp: (!) 95.9 F (35.5 C)     Body mass index is 26.6 kg/m.    ECOG FS:0 - Asymptomatic  General: Well-developed, well-nourished, no acute distress. Eyes: anicteric sclera. Lungs: Clear to auscultation bilaterally. Heart: Regular rate and rhythm. No rubs, murmurs, or gallops. Abdomen: Soft, nontender, nondistended. N Musculoskeletal: No edema, cyanosis, or clubbing. Neuro: Alert, answering all questions appropriately.  Skin: No rashes or petechiae noted. Psych: Appears anxious   LAB RESULTS:  Lab Results  Component Value Date   NA 136 06/01/2016   K 4.3 06/01/2016   CL 97 (L) 06/01/2016   CO2 33 (H) 06/01/2016   GLUCOSE 93 06/01/2016   BUN 7 06/01/2016   CREATININE 0.90 06/01/2016   CALCIUM  9.3 06/01/2016   PROT 6.9 06/01/2016   ALBUMIN 4.2 06/01/2016   AST 24 06/01/2016   ALT 26 06/01/2016   ALKPHOS 74 06/01/2016   BILITOT 0.8 06/01/2016   GFRNONAA >60 06/01/2016   GFRAA >60 06/01/2016    Lab Results  Component Value Date   WBC 10.9 (H) 07/07/2016   NEUTROABS 8.5 (H) 07/07/2016   HGB 17.8 07/07/2016   HCT 51.3 07/07/2016   MCV 93.8 07/07/2016   PLT 233 07/07/2016     STUDIES:  ASSESSMENT: PET positive left upper lobe pulmonary nodule, Secondary polycythemia.  PLAN:    1.  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  Previously, the remainder of his laboratory work, including JAK-2 mutation, was either negative or   within normal limits. Proceed with 300 mL phlebotomy later this week. Goal hemoglobin will be approximately 17.0. Return to clinic at the end of October for repeat laboratory work and consideration of additional phlebotomy. 2. PET positive left upper lobe pulmonary nodule: Biopsy was negative for malignant cells. Given the mild hypermetabolism and patient's heavy tobacco history, this is still suspicious for underlying malignancy. Will repeat CT scan at the end of October which will be 3 months from his previous one to assess for any interval change. Follow-up as above.  3.  Colon polyps:  Patient has a personal history of greater then 10 adenomatous polyps on his most recent conoloscopy. He does not know of any family history of increased polyps or colon cancer.  Genetic testing to assess for the APC mutation for FAP or AFAP was negative. Continue colonoscopies as per GI. 4. Tobacco Use: Patient continues to heavily smoke, but both he and his wife have expressed interest in quitting. 5. Anxiety: Treatment as per pcp.  Approximately 30 minutes was spent in discussion of which greater than 50% was consultation.  Patient expressed understanding and was in agreement with this plan. He also understands that He can call clinic at any time with  any questions, concerns, or complaints.    [name], MD   07/09/2016 1:52 PM

## 2016-07-07 ENCOUNTER — Inpatient Hospital Stay: Payer: BLUE CROSS/BLUE SHIELD

## 2016-07-07 ENCOUNTER — Inpatient Hospital Stay: Payer: BLUE CROSS/BLUE SHIELD | Attending: Oncology | Admitting: Oncology

## 2016-07-07 ENCOUNTER — Other Ambulatory Visit: Payer: Self-pay

## 2016-07-07 VITALS — BP 154/95 | HR 83 | Temp 95.9°F | Resp 18 | Wt 196.1 lb

## 2016-07-07 DIAGNOSIS — F419 Anxiety disorder, unspecified: Secondary | ICD-10-CM | POA: Insufficient documentation

## 2016-07-07 DIAGNOSIS — Z86010 Personal history of colonic polyps: Secondary | ICD-10-CM | POA: Insufficient documentation

## 2016-07-07 DIAGNOSIS — Z87891 Personal history of nicotine dependence: Secondary | ICD-10-CM | POA: Diagnosis not present

## 2016-07-07 DIAGNOSIS — D751 Secondary polycythemia: Secondary | ICD-10-CM | POA: Diagnosis not present

## 2016-07-07 DIAGNOSIS — Z8041 Family history of malignant neoplasm of ovary: Secondary | ICD-10-CM | POA: Insufficient documentation

## 2016-07-07 DIAGNOSIS — I10 Essential (primary) hypertension: Secondary | ICD-10-CM | POA: Diagnosis not present

## 2016-07-07 DIAGNOSIS — I499 Cardiac arrhythmia, unspecified: Secondary | ICD-10-CM | POA: Insufficient documentation

## 2016-07-07 DIAGNOSIS — J449 Chronic obstructive pulmonary disease, unspecified: Secondary | ICD-10-CM | POA: Insufficient documentation

## 2016-07-07 DIAGNOSIS — K1370 Unspecified lesions of oral mucosa: Secondary | ICD-10-CM | POA: Diagnosis not present

## 2016-07-07 DIAGNOSIS — Z8669 Personal history of other diseases of the nervous system and sense organs: Secondary | ICD-10-CM | POA: Insufficient documentation

## 2016-07-07 DIAGNOSIS — F17210 Nicotine dependence, cigarettes, uncomplicated: Secondary | ICD-10-CM | POA: Diagnosis not present

## 2016-07-07 DIAGNOSIS — I209 Angina pectoris, unspecified: Secondary | ICD-10-CM | POA: Diagnosis not present

## 2016-07-07 DIAGNOSIS — Z8701 Personal history of pneumonia (recurrent): Secondary | ICD-10-CM | POA: Diagnosis not present

## 2016-07-07 DIAGNOSIS — I7300 Raynaud's syndrome without gangrene: Secondary | ICD-10-CM | POA: Diagnosis not present

## 2016-07-07 DIAGNOSIS — Z79899 Other long term (current) drug therapy: Secondary | ICD-10-CM | POA: Diagnosis not present

## 2016-07-07 DIAGNOSIS — I2510 Atherosclerotic heart disease of native coronary artery without angina pectoris: Secondary | ICD-10-CM | POA: Insufficient documentation

## 2016-07-07 DIAGNOSIS — R911 Solitary pulmonary nodule: Secondary | ICD-10-CM | POA: Insufficient documentation

## 2016-07-07 DIAGNOSIS — K219 Gastro-esophageal reflux disease without esophagitis: Secondary | ICD-10-CM | POA: Insufficient documentation

## 2016-07-07 LAB — CBC WITH DIFFERENTIAL/PLATELET
BASOS ABS: 0.1 10*3/uL (ref 0–0.1)
BASOS PCT: 1 %
EOS ABS: 0.1 10*3/uL (ref 0–0.7)
EOS PCT: 1 %
HCT: 51.3 % (ref 40.0–52.0)
Hemoglobin: 17.8 g/dL (ref 13.0–18.0)
LYMPHS ABS: 1.5 10*3/uL (ref 1.0–3.6)
Lymphocytes Relative: 13 %
MCH: 32.6 pg (ref 26.0–34.0)
MCHC: 34.8 g/dL (ref 32.0–36.0)
MCV: 93.8 fL (ref 80.0–100.0)
Monocytes Absolute: 0.7 10*3/uL (ref 0.2–1.0)
Monocytes Relative: 7 %
Neutro Abs: 8.5 10*3/uL — ABNORMAL HIGH (ref 1.4–6.5)
Neutrophils Relative %: 78 %
PLATELETS: 233 10*3/uL (ref 150–440)
RBC: 5.47 MIL/uL (ref 4.40–5.90)
RDW: 13.7 % (ref 11.5–14.5)
WBC: 10.9 10*3/uL — AB (ref 3.8–10.6)

## 2016-07-07 NOTE — Progress Notes (Signed)
Biopsy went well per patient. Has questions regarding what the next step is since his biopsy was negative.

## 2016-07-09 ENCOUNTER — Telehealth: Payer: Self-pay | Admitting: *Deleted

## 2016-07-09 NOTE — Telephone Encounter (Signed)
Pt notified of lab results

## 2016-07-09 NOTE — Telephone Encounter (Signed)
-----   Message from Cephus Richer sent at 07/08/2016  4:48 PM EDT ----- Contact: [phone] Pt would like to know what his lab numbers were.

## 2016-07-14 ENCOUNTER — Inpatient Hospital Stay: Payer: BLUE CROSS/BLUE SHIELD

## 2016-07-14 VITALS — BP 126/81 | HR 78 | Temp 97.5°F | Resp 18

## 2016-07-14 DIAGNOSIS — D751 Secondary polycythemia: Secondary | ICD-10-CM | POA: Diagnosis not present

## 2016-08-02 ENCOUNTER — Encounter: Payer: Self-pay | Admitting: Internal Medicine

## 2016-08-02 ENCOUNTER — Ambulatory Visit (INDEPENDENT_AMBULATORY_CARE_PROVIDER_SITE_OTHER): Payer: BLUE CROSS/BLUE SHIELD | Admitting: Internal Medicine

## 2016-08-02 VITALS — BP 124/72 | HR 87 | Ht 72.0 in | Wt 193.0 lb

## 2016-08-02 DIAGNOSIS — J432 Centrilobular emphysema: Secondary | ICD-10-CM

## 2016-08-02 DIAGNOSIS — R911 Solitary pulmonary nodule: Secondary | ICD-10-CM

## 2016-08-02 DIAGNOSIS — F17210 Nicotine dependence, cigarettes, uncomplicated: Secondary | ICD-10-CM

## 2016-08-02 MED ORDER — FLUTICASONE FUROATE-VILANTEROL 200-25 MCG/INH IN AEPB: 1.0000 | INHALATION_SPRAY | Freq: Every day | RESPIRATORY_TRACT | 5 refills | Status: DC

## 2016-08-02 MED ORDER — ALBUTEROL SULFATE HFA 108 (90 BASE) MCG/ACT IN AERS: 2.0000 | INHALATION_SPRAY | Freq: Four times a day (QID) | RESPIRATORY_TRACT | 3 refills | Status: DC | PRN

## 2016-08-02 MED ORDER — NICOTINE 21 MG/24HR TD PT24: MEDICATED_PATCH | TRANSDERMAL | 0 refills | Status: DC

## 2016-08-02 NOTE — Assessment & Plan Note (Addendum)
Still smoking up to 2 packs per day Cont with Breo 200/25 His last spirometry showed an FEV1 of 60% with moderate obstruction.  Plan: -Avoid tobacco as much as possible -Breo 200/25 one puff daily gargle and rinse after each use -Tobacco counseling cessation given

## 2016-08-02 NOTE — Assessment & Plan Note (Addendum)
ENB of LUL lesion negative for malignant cell in 06/2016. Scheduled for a repeat CT Chest in 08/2016 - if lesion is increasing in size, may need further surgical evaluation

## 2016-08-02 NOTE — Patient Instructions (Addendum)
Follow up in 2 months - keep appointment of repeat CT chest scan in Oct 2017 - please cut back and eventually stop smoking - Nicotine patch 21 g 2 weeks, then resume your 2 weeks of 14 g and 4 weeks of 7 g - cont with inhalers for COPD

## 2016-08-02 NOTE — Assessment & Plan Note (Signed)
Tobacco Cessation - Counseling regarding benefits of smoking cessation strategies was provided for more than 3 min. - Educated that at this time smoking- cessation represents the single most important step that patient can take to enhance the length and quality of live. - Educated patient regarding alternatives of behavior interventions, pharmacotherapy including NRT and non-nicotine therapy such, and combinations of both. - Patient at this time will try to quit. Tried nicotine patch, but stopped with 4 days left of 21 mcg, willing to restart patch.  - Nicotine Patch 27mg x 2 weeks,  Then resume 131m x 2 weeks, 7 mcg x 4 weeks.  Please alternate site of patch daily, ie. Right shoulder today, left should tomorrow, etc.

## 2016-08-02 NOTE — Progress Notes (Signed)
[facility] Consultation      MRN# [id number] Paul Carpenter 09/14/1956   CC: Chief Complaint  Patient presents with  . Follow-up    pt had bronch 06/28/16      Brief History: Synopsis: Paul Carpenter first saw the [facility] in July of 2014 for COPD. Simple spirometry showed a ratio 62%, FEV1 of 2.41 L (60% predicted). He had smoked 2 packs of cigarettes daily since his teenage years and continues to smoke. He has had multiple sinus infections and 2 sinus surgeries in the past.. LUL lung lesion negative for malignancy based on ENB 06/2016.  Events since last clinic visit: Patient presents today for follow-up visit of his left upper lobe lung lesion. At his last visit he had decided on bronchoscopic evaluation. Electronic navigational bronchoscopy was performed of the left upper lobe lesion, currently no malignant cells were found only inflammatory cells. He is at high risk still for malignancy of the lung and is being followed closely by hematology oncology, and will have a repeat CT in October 2017 . Since his last visit he's had right rotator cuff repair and is attending rehabilitation.    Current Outpatient Prescriptions:  .  albuterol (PROAIR HFA) 108 (90 BASE) MCG/ACT inhaler, Inhale 1-2 puffs into the lungs every 6 (six) hours as needed for wheezing or shortness of breath. , Disp: , Rfl:  .  atorvastatin (LIPITOR) 40 MG tablet, Take 1 tablet by mouth daily., Disp: , Rfl:  .  diazepam (VALIUM) 5 MG tablet, Take 5 mg by mouth every 6 (six) hours as needed for anxiety., Disp: , Rfl:  .  fluticasone furoate-vilanterol (BREO ELLIPTA) 200-25 MCG/INH AEPB, Inhale 1 puff into the lungs daily., Disp: 60 each, Rfl: 3 .  gabapentin (NEURONTIN) 300 MG capsule, Take 600 mg by mouth at bedtime., Disp: , Rfl:  .  hydrochlorothiazide (MICROZIDE) 12.5 MG capsule, Take 1 capsule by mouth daily., Disp: , Rfl:  .  methocarbamol (ROBAXIN) 500 MG  tablet, Take 500 mg by mouth every 6 (six) hours as needed for muscle spasms., Disp: , Rfl:  .  Nicotine 21-14-7 MG/24HR KIT, Place 1 kit onto the skin as directed., Disp: 56 each, Rfl: 0 .  oxyCODONE (OXY IR/ROXICODONE) 5 MG immediate release tablet, Take 5 mg by mouth every 4 (four) hours as needed (pain). , Disp: , Rfl:  .  pantoprazole (PROTONIX) 20 MG tablet, Take 20 mg by mouth daily. , Disp: , Rfl:  .  propranolol ER (INDERAL LA) 80 MG 24 hr capsule, Take 80 mg by mouth daily., Disp: , Rfl: 5 .  testosterone enanthate (DELATESTRYL) 200 MG/ML injection, Inject 1.3 mg into the muscle every 14 (fourteen) days. For IM use only, Disp: , Rfl:    Review of Systems  Constitutional: Negative for chills and fever.  Eyes: Negative for blurred vision.  Respiratory: Positive for cough and shortness of breath. Negative for hemoptysis, sputum production and wheezing.   Cardiovascular: Negative for chest pain.  Gastrointestinal: Negative for heartburn, nausea and vomiting.  Genitourinary: Negative for dysuria.  Musculoskeletal: Positive for joint pain.  Skin: Negative for rash.  Neurological: Negative for dizziness and headaches.  Endo/Heme/Allergies: Does not bruise/bleed easily.  Psychiatric/Behavioral: Negative for depression.      Allergies:  Aspirin; Lisinopril; and Varenicline  Physical Examination:  VS: BP 124/72 (BP Location: Left Arm, Cuff Size: Normal)   Pulse 87   Ht 6' (1.829 m)   Wt 193 lb (87.5 kg)     SpO2 98%   BMI 26.18 kg/m   General Appearance: No distress  HEENT: PERRLA, no ptosis, no other lesions noticed Pulmonary:normal breath sounds., diaphragmatic excursion normal.No wheezing, No rales   Cardiovascular:  Normal S1,S2.  No m/r/g.     Abdomen:Exam: Benign, Soft, non-tender, No masses  Skin:   warm, no rashes, no ecchymosis  Extremities: normal, no cyanosis, clubbing, warm with normal capillary refill.     Assessment and Plan: 60 year old male with COPD, now  with left upper lobe lung lesion, currently with negative navigational bronchoscopy and biopsy, receiving surveillance CTs for this lesion, presents today for follow-up visit and COPD optimization Lesion of left lung ENB of LUL lesion negative for malignant cell in 06/2016. Scheduled for a repeat CT Chest in 08/2016 - if lesion is increasing in size, may need further surgical evaluation  COPD (chronic obstructive pulmonary disease) Still smoking up to 2 packs per day Cont with Breo 200/25 His last spirometry showed an FEV1 of 60% with moderate obstruction.  Plan: -Avoid tobacco as much as possible -Breo 200/25 one puff daily gargle and rinse after each use -Tobacco counseling cessation given  Cigarette smoker Tobacco Cessation - Counseling regarding benefits of smoking cessation strategies was provided for more than 3 min. - Educated that at this time smoking- cessation represents the single most important step that patient can take to enhance the length and quality of live. - Educated patient regarding alternatives of behavior interventions, pharmacotherapy including NRT and non-nicotine therapy such, and combinations of both. - Patient at this time will try to quit. Tried nicotine patch, but stopped with 4 days left of 21 mcg, willing to restart patch.  - Nicotine Patch 36mg x 2 weeks,  Then resume 154m x 2 weeks, 7 mcg x 4 weeks.  Please alternate site of patch daily, ie. Right shoulder today, left should tomorrow, etc.      Updated Medication List Outpatient Encounter Prescriptions as of 08/02/2016  Medication Sig  . albuterol (PROAIR HFA) 108 (90 BASE) MCG/ACT inhaler Inhale 1-2 puffs into the lungs every 6 (six) hours as needed for wheezing or shortness of breath.   . Marland Kitchentorvastatin (LIPITOR) 40 MG tablet Take 1 tablet by mouth daily.  . diazepam (VALIUM) 5 MG tablet Take 5 mg by mouth every 6 (six) hours as needed for anxiety.  . fluticasone furoate-vilanterol (BREO ELLIPTA)  200-25 MCG/INH AEPB Inhale 1 puff into the lungs daily.  . Marland Kitchenabapentin (NEURONTIN) 300 MG capsule Take 600 mg by mouth at bedtime.  . hydrochlorothiazide (MICROZIDE) 12.5 MG capsule Take 1 capsule by mouth daily.  . methocarbamol (ROBAXIN) 500 MG tablet Take 500 mg by mouth every 6 (six) hours as needed for muscle spasms.  . Nicotine 21-14-7 MG/24HR KIT Place 1 kit onto the skin as directed.  . Marland KitchenxyCODONE (OXY IR/ROXICODONE) 5 MG immediate release tablet Take 5 mg by mouth every 4 (four) hours as needed (pain).   . pantoprazole (PROTONIX) 20 MG tablet Take 20 mg by mouth daily.   . propranolol ER (INDERAL LA) 80 MG 24 hr capsule Take 80 mg by mouth daily.  . Marland Kitchenestosterone enanthate (DELATESTRYL) 200 MG/ML injection Inject 1.3 mg into the muscle every 14 (fourteen) days. For IM use only   No facility-administered encounter medications on file as of 08/02/2016.     Orders for this visit: No orders of the defined types were placed in this encounter.   Thank  you for the visitation and for allowing  [facility] to assist in the care of your patient. Our recommendations are noted above.  Please contact us if we can be of further service.  Vishal Mungal, MD [facility] Office Number: [phone]  Note: This note was prepared with Dragon dictation along with smaller phrase technology. Any transcriptional errors that result from this process are unintentional.  

## 2016-08-20 ENCOUNTER — Other Ambulatory Visit: Payer: Self-pay | Admitting: *Deleted

## 2016-08-20 DIAGNOSIS — Z01812 Encounter for preprocedural laboratory examination: Secondary | ICD-10-CM

## 2016-08-23 ENCOUNTER — Inpatient Hospital Stay: Payer: BLUE CROSS/BLUE SHIELD | Attending: Oncology

## 2016-08-23 ENCOUNTER — Ambulatory Visit
Admission: RE | Admit: 2016-08-23 | Discharge: 2016-08-23 | Disposition: A | Discharge status: Home / Self Care | Payer: BLUE CROSS/BLUE SHIELD | Source: Ambulatory Visit | Attending: Oncology | Admitting: Oncology

## 2016-08-23 DIAGNOSIS — M25511 Pain in right shoulder: Secondary | ICD-10-CM | POA: Insufficient documentation

## 2016-08-23 DIAGNOSIS — I209 Angina pectoris, unspecified: Secondary | ICD-10-CM | POA: Insufficient documentation

## 2016-08-23 DIAGNOSIS — G4730 Sleep apnea, unspecified: Secondary | ICD-10-CM | POA: Diagnosis not present

## 2016-08-23 DIAGNOSIS — K649 Unspecified hemorrhoids: Secondary | ICD-10-CM | POA: Diagnosis not present

## 2016-08-23 DIAGNOSIS — Z8701 Personal history of pneumonia (recurrent): Secondary | ICD-10-CM | POA: Diagnosis not present

## 2016-08-23 DIAGNOSIS — R918 Other nonspecific abnormal finding of lung field: Secondary | ICD-10-CM | POA: Insufficient documentation

## 2016-08-23 DIAGNOSIS — F419 Anxiety disorder, unspecified: Secondary | ICD-10-CM | POA: Insufficient documentation

## 2016-08-23 DIAGNOSIS — Z8042 Family history of malignant neoplasm of prostate: Secondary | ICD-10-CM | POA: Diagnosis not present

## 2016-08-23 DIAGNOSIS — R0602 Shortness of breath: Secondary | ICD-10-CM | POA: Diagnosis not present

## 2016-08-23 DIAGNOSIS — D751 Secondary polycythemia: Secondary | ICD-10-CM | POA: Insufficient documentation

## 2016-08-23 DIAGNOSIS — Z8041 Family history of malignant neoplasm of ovary: Secondary | ICD-10-CM | POA: Insufficient documentation

## 2016-08-23 DIAGNOSIS — Z01812 Encounter for preprocedural laboratory examination: Secondary | ICD-10-CM

## 2016-08-23 DIAGNOSIS — K219 Gastro-esophageal reflux disease without esophagitis: Secondary | ICD-10-CM | POA: Diagnosis not present

## 2016-08-23 DIAGNOSIS — K921 Melena: Secondary | ICD-10-CM | POA: Insufficient documentation

## 2016-08-23 DIAGNOSIS — I2510 Atherosclerotic heart disease of native coronary artery without angina pectoris: Secondary | ICD-10-CM | POA: Insufficient documentation

## 2016-08-23 DIAGNOSIS — Z87891 Personal history of nicotine dependence: Secondary | ICD-10-CM | POA: Diagnosis not present

## 2016-08-23 DIAGNOSIS — R911 Solitary pulmonary nodule: Secondary | ICD-10-CM | POA: Diagnosis present

## 2016-08-23 DIAGNOSIS — R490 Dysphonia: Secondary | ICD-10-CM | POA: Insufficient documentation

## 2016-08-23 DIAGNOSIS — K121 Other forms of stomatitis: Secondary | ICD-10-CM | POA: Insufficient documentation

## 2016-08-23 DIAGNOSIS — F17210 Nicotine dependence, cigarettes, uncomplicated: Secondary | ICD-10-CM | POA: Insufficient documentation

## 2016-08-23 DIAGNOSIS — I7300 Raynaud's syndrome without gangrene: Secondary | ICD-10-CM | POA: Diagnosis not present

## 2016-08-23 DIAGNOSIS — J449 Chronic obstructive pulmonary disease, unspecified: Secondary | ICD-10-CM | POA: Insufficient documentation

## 2016-08-23 DIAGNOSIS — Z79899 Other long term (current) drug therapy: Secondary | ICD-10-CM | POA: Insufficient documentation

## 2016-08-23 DIAGNOSIS — Z86010 Personal history of colonic polyps: Secondary | ICD-10-CM | POA: Insufficient documentation

## 2016-08-23 DIAGNOSIS — I10 Essential (primary) hypertension: Secondary | ICD-10-CM | POA: Insufficient documentation

## 2016-08-23 LAB — CBC WITH DIFFERENTIAL/PLATELET
BASOS ABS: 0.1 10*3/uL (ref 0–0.1)
BASOS PCT: 1 %
EOS ABS: 0.1 10*3/uL (ref 0–0.7)
Eosinophils Relative: 1 %
HCT: 49.7 % (ref 40.0–52.0)
HEMOGLOBIN: 17.1 g/dL (ref 13.0–18.0)
Lymphocytes Relative: 18 %
Lymphs Abs: 1.6 10*3/uL (ref 1.0–3.6)
MCH: 31.7 pg (ref 26.0–34.0)
MCHC: 34.5 g/dL (ref 32.0–36.0)
MCV: 92.1 fL (ref 80.0–100.0)
Monocytes Absolute: 0.6 10*3/uL (ref 0.2–1.0)
Monocytes Relative: 7 %
NEUTROS PCT: 73 %
Neutro Abs: 6.4 10*3/uL (ref 1.4–6.5)
Platelets: 222 10*3/uL (ref 150–440)
RBC: 5.4 MIL/uL (ref 4.40–5.90)
RDW: 13.9 % (ref 11.5–14.5)
WBC: 8.8 10*3/uL (ref 3.8–10.6)

## 2016-08-23 LAB — CREATININE, SERUM
CREATININE: 0.78 mg/dL (ref 0.61–1.24)
GFR calc Af Amer: >60 mL/min (ref >60)

## 2016-08-23 MED ORDER — IOPAMIDOL (ISOVUE-300) INJECTION 61%
75.0000 mL | Freq: Once | INTRAVENOUS | Status: AC | PRN
  Administered 2016-08-23: 75 mL via INTRAVENOUS

## 2016-08-24 NOTE — Progress Notes (Signed)
[place]  [phone] [phone]) [phone]  ID: Paul Carpenter OB: [birth year]  MR#: [id number]  KYH#:[id number]  Patient Care Team: Kirk Ruths, MD as PCP - General (Internal Medicine)  CHIEF COMPLAINT: Left upper lobe pulmonary nodule, secondary polycythemia  INTERVAL HISTORY: Patient returns to clinic today for further evaluation, discussion of his imaging results, and consideration of phlebotomy. He continues to be anxious, but otherwise feels well. He has right shoulder pain secondary to a recent rotator cuff surgery. He has no neurologic complaints. He denies any recent fevers or illnesses. He has no chest pain, cough, hemoptysis, or shortness of breath. He denies any nausea, vomiting, constipation, or diarrhea. He has no urinary complaints. Patient offers no further specific complaints today.   REVIEW OF SYSTEMS:   Review of Systems  Constitutional: Negative.  Negative for fever, malaise/fatigue and weight loss.  Respiratory: Negative.  Negative for cough and shortness of breath.   Cardiovascular: Negative.  Negative for chest pain.  Gastrointestinal: Negative.  Negative for abdominal pain, blood in stool and melena.  Genitourinary: Negative.   Musculoskeletal: Positive for joint pain.  Neurological: Negative.  Negative for sensory change and weakness.  Psychiatric/Behavioral: The patient is nervous/anxious.     As per HPI. Otherwise, a complete review of systems is negative.  PAST MEDICAL HISTORY: Past Medical History:  Diagnosis Date  . Anginal pain ([place])   . Anxiety   . Chicken pox   . Complication of anesthesia    o2 dropped after neck fusion  . COPD (chronic obstructive pulmonary disease) ([place])   . Coronary artery disease   . Cough    chronic  clear phlegm  . Dysrhythmia   . GERD (gastroesophageal reflux disease)    h/o reflux/ hoarsness  . Hematochezia   . Hemorrhoids   . Hoarseness   . Hypertension   . Migraines   .  OSA (obstructive sleep apnea)    has CPAP but does not use  . Personal history of tobacco use, presenting hazards to health 03/05/2016  . Pneumonia    5/17  . Raynaud disease   . Raynaud's disease   . Rotator cuff tear    on right  . Shortness of breath dyspnea   . Sleep apnea   . Ulcer (traumatic) of oral mucosa     PAST SURGICAL HISTORY: Past Surgical History:  Procedure Laterality Date  . BACK SURGERY     cervical fusion x 2  . CARDIAC CATHETERIZATION    . CERVICAL DISCECTOMY    . COLONOSCOPY    . COLONOSCOPY N/A 07/25/2015   Procedure: COLONOSCOPY;  Surgeon: Lollie Sails, MD;  Location: [facility] ENDOSCOPY;  Service: Endoscopy;  Laterality: N/A;  . ELECTROMAGNETIC NAVIGATION BROCHOSCOPY Left 06/28/2016   Procedure: ELECTROMAGNETIC NAVIGATION BRONCHOSCOPY;  Surgeon: Vilinda Boehringer, MD;  Location: [facility] ORS;  Service: Cardiopulmonary;  Laterality: Left;  . ESOPHAGOGASTRODUODENOSCOPY N/A 07/25/2015   Procedure: ESOPHAGOGASTRODUODENOSCOPY (EGD);  Surgeon: Lollie Sails, MD;  Location[name] ENDOSCOPY;  Service: Endoscopy;  Laterality: N/A;  . NASAL SINUS SURGERY     x 2   . NECK SURGERY    . SEPTOPLASTY    . SKIN GRAFT      FAMILY HISTORY Family History  Problem Relation Age of Onset  . Heart disease Father   . Prostate cancer Father   . Heart disease Paternal Grandmother   . Heart attack Maternal Grandfather 29       ADVANCED DIRECTIVES:    HEALTH MAINTENANCE:  Social History  Substance Use Topics  . Smoking status: Current Every Day Smoker    Packs/day: 2.00    Years: 45.00    Types: Cigarettes  . Smokeless tobacco: Never Used  . Alcohol use 1.2 oz/week    2 Standard drinks or equivalent per week     Allergies  Allergen Reactions  . Aspirin Other (See Comments)  . Lisinopril Other (See Comments)  . Varenicline Other (See Comments)    Current Outpatient Prescriptions  Medication Sig Dispense Refill  . albuterol (PROAIR HFA) 108 (90 Base) MCG/ACT  inhaler Inhale 2 puffs into the lungs every 6 (six) hours as needed for wheezing or shortness of breath. 1 Inhaler 3  . atorvastatin (LIPITOR) 40 MG tablet Take 1 tablet by mouth daily.    . diazepam (VALIUM) 5 MG tablet Take 5 mg by mouth every 6 (six) hours as needed for anxiety.    . fluticasone furoate-vilanterol (BREO ELLIPTA) 200-25 MCG/INH AEPB Inhale 1 puff into the lungs daily. 60 each 5  . gabapentin (NEURONTIN) 300 MG capsule Take 600 mg by mouth at bedtime.    . hydrochlorothiazide (MICROZIDE) 12.5 MG capsule Take 1 capsule by mouth daily.    . methocarbamol (ROBAXIN) 500 MG tablet Take 500 mg by mouth every 6 (six) hours as needed for muscle spasms.    . nicotine (NICODERM CQ - DOSED IN MG/24 HOURS) 21 mg/24hr patch Place 1 patch on skin for 2 weeks. 14 patch 0  . Nicotine 21-14-7 MG/24HR KIT Place 1 kit onto the skin as directed. 56 each 0  . oxyCODONE (OXY IR/ROXICODONE) 5 MG immediate release tablet Take 5 mg by mouth every 4 (four) hours as needed (pain).     . pantoprazole (PROTONIX) 20 MG tablet Take 20 mg by mouth daily.     . propranolol ER (INDERAL LA) 80 MG 24 hr capsule Take 80 mg by mouth daily.  5  . testosterone enanthate (DELATESTRYL) 200 MG/ML injection Inject 1.3 mg into the muscle every 14 (fourteen) days. For IM use only     No current facility-administered medications for this visit.     OBJECTIVE: Vitals:   08/25/16 0932  BP: (!) 151/90  Pulse: 87  Resp: 18  Temp: 97.6 F (36.4 C)     Body mass index is 26.15 kg/m.    ECOG FS:0 - Asymptomatic  General: Well-developed, well-nourished, no acute distress. Eyes: anicteric sclera. Lungs: Clear to auscultation bilaterally. Heart: Regular rate and rhythm. No rubs, murmurs, or gallops. Abdomen: Soft, nontender, nondistended. N Musculoskeletal: No edema, cyanosis, or clubbing. Neuro: Alert, answering all questions appropriately.  Skin: No rashes or petechiae noted. Psych: Appears anxious   LAB  RESULTS:  Lab Results  Component Value Date   NA 136 06/01/2016   K 4.3 06/01/2016   CL 97 (L) 06/01/2016   CO2 33 (H) 06/01/2016   GLUCOSE 93 06/01/2016   BUN 7 06/01/2016   CREATININE 0.78 08/23/2016   CALCIUM 9.3 06/01/2016   PROT 6.9 06/01/2016   ALBUMIN 4.2 06/01/2016   AST 24 06/01/2016   ALT 26 06/01/2016   ALKPHOS 74 06/01/2016   BILITOT 0.8 06/01/2016   GFRNONAA >60 08/23/2016   GFRAA >60 08/23/2016    Lab Results  Component Value Date   WBC 8.8 08/23/2016   NEUTROABS 6.4 08/23/2016   HGB 17.1 08/23/2016   HCT 49.7 08/23/2016   MCV 92.1 08/23/2016   PLT 222 08/23/2016     STUDIES:  ASSESSMENT: PET  positive left upper lobe pulmonary nodule, Secondary polycythemia.  PLAN:    1.  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  Previously, the remainder of his laboratory work, including JAK-2 mutation, was either negative or within normal limits. Proceed with 300 mL phlebotomy today. Goal hemoglobin will be approximately 17.0. Return to clinic in 6 weeks for laboratory work and consideration of phlebotomy and then in 3 months for further evaluation.  2. PET positive left upper lobe pulmonary nodule: Biopsy was negative for malignant cells. Given the mild hypermetabolism and patient's heavy tobacco history, this is still suspicious for underlying malignancy. CT scan on August 23, 2016 revealed no interval change. Repeat CT scan in 6 months. Follow-up as above.  3.  Colon polyps:  Patient has a personal history of greater then 10 adenomatous polyps on his most recent conoloscopy. He does not know of any family history of increased polyps or colon cancer.  Genetic testing to assess for the APC mutation for FAP or AFAP was negative. Continue colonoscopies as per GI. 4. Tobacco Use: Patient continues to heavily smoke, but both he and his wife have expressed interest in quitting. 5. Anxiety: Treatment as per pcp.  Approximately 30 minutes was spent in  discussion of which greater than 50% was consultation.  Patient expressed understanding and was in agreement with this plan. He also understands that He can call clinic at any time with any questions, concerns, or complaints.    Paul Huger, MD   08/29/2016 11:12 AM

## 2016-08-25 ENCOUNTER — Inpatient Hospital Stay: Payer: BLUE CROSS/BLUE SHIELD

## 2016-08-25 ENCOUNTER — Inpatient Hospital Stay (HOSPITAL_BASED_OUTPATIENT_CLINIC_OR_DEPARTMENT_OTHER): Payer: BLUE CROSS/BLUE SHIELD | Admitting: Oncology

## 2016-08-25 ENCOUNTER — Ambulatory Visit: Payer: BLUE CROSS/BLUE SHIELD | Admitting: Oncology

## 2016-08-25 VITALS — BP 151/90 | HR 87 | Temp 97.6°F | Resp 18 | Wt 192.8 lb

## 2016-08-25 DIAGNOSIS — I209 Angina pectoris, unspecified: Secondary | ICD-10-CM

## 2016-08-25 DIAGNOSIS — F17210 Nicotine dependence, cigarettes, uncomplicated: Secondary | ICD-10-CM

## 2016-08-25 DIAGNOSIS — F419 Anxiety disorder, unspecified: Secondary | ICD-10-CM

## 2016-08-25 DIAGNOSIS — Z8042 Family history of malignant neoplasm of prostate: Secondary | ICD-10-CM

## 2016-08-25 DIAGNOSIS — Z87891 Personal history of nicotine dependence: Secondary | ICD-10-CM

## 2016-08-25 DIAGNOSIS — I7300 Raynaud's syndrome without gangrene: Secondary | ICD-10-CM

## 2016-08-25 DIAGNOSIS — Z86010 Personal history of colonic polyps: Secondary | ICD-10-CM | POA: Diagnosis not present

## 2016-08-25 DIAGNOSIS — R918 Other nonspecific abnormal finding of lung field: Secondary | ICD-10-CM

## 2016-08-25 DIAGNOSIS — G4730 Sleep apnea, unspecified: Secondary | ICD-10-CM

## 2016-08-25 DIAGNOSIS — K219 Gastro-esophageal reflux disease without esophagitis: Secondary | ICD-10-CM

## 2016-08-25 DIAGNOSIS — R0602 Shortness of breath: Secondary | ICD-10-CM

## 2016-08-25 DIAGNOSIS — Z79899 Other long term (current) drug therapy: Secondary | ICD-10-CM

## 2016-08-25 DIAGNOSIS — I10 Essential (primary) hypertension: Secondary | ICD-10-CM

## 2016-08-25 DIAGNOSIS — I2510 Atherosclerotic heart disease of native coronary artery without angina pectoris: Secondary | ICD-10-CM

## 2016-08-25 DIAGNOSIS — Z8041 Family history of malignant neoplasm of ovary: Secondary | ICD-10-CM

## 2016-08-25 DIAGNOSIS — J449 Chronic obstructive pulmonary disease, unspecified: Secondary | ICD-10-CM

## 2016-08-25 DIAGNOSIS — R490 Dysphonia: Secondary | ICD-10-CM

## 2016-08-25 DIAGNOSIS — D751 Secondary polycythemia: Secondary | ICD-10-CM

## 2016-08-25 DIAGNOSIS — Z8701 Personal history of pneumonia (recurrent): Secondary | ICD-10-CM

## 2016-08-25 DIAGNOSIS — K649 Unspecified hemorrhoids: Secondary | ICD-10-CM

## 2016-08-25 DIAGNOSIS — K921 Melena: Secondary | ICD-10-CM

## 2016-08-25 DIAGNOSIS — M25511 Pain in right shoulder: Secondary | ICD-10-CM

## 2016-08-25 DIAGNOSIS — K121 Other forms of stomatitis: Secondary | ICD-10-CM

## 2016-08-25 NOTE — Progress Notes (Signed)
States is feeling well. Offers no complaints. Had rotator cuff surgery appx 5-6 weeks ago.

## 2016-09-09 ENCOUNTER — Emergency Department: Payer: BLUE CROSS/BLUE SHIELD

## 2016-09-09 ENCOUNTER — Encounter: Payer: Self-pay | Admitting: Emergency Medicine

## 2016-09-09 ENCOUNTER — Inpatient Hospital Stay
Admission: EM | Admit: 2016-09-09 | Discharge: 2016-09-11 | DRG: 871 | Disposition: A | Discharge status: Home / Self Care | Payer: BLUE CROSS/BLUE SHIELD | Attending: Internal Medicine | Admitting: Internal Medicine

## 2016-09-09 DIAGNOSIS — A419 Sepsis, unspecified organism: Secondary | ICD-10-CM | POA: Diagnosis present

## 2016-09-09 DIAGNOSIS — Z8249 Family history of ischemic heart disease and other diseases of the circulatory system: Secondary | ICD-10-CM | POA: Diagnosis not present

## 2016-09-09 DIAGNOSIS — E785 Hyperlipidemia, unspecified: Secondary | ICD-10-CM | POA: Diagnosis present

## 2016-09-09 DIAGNOSIS — R0602 Shortness of breath: Secondary | ICD-10-CM | POA: Diagnosis not present

## 2016-09-09 DIAGNOSIS — J440 Chronic obstructive pulmonary disease with acute lower respiratory infection: Secondary | ICD-10-CM | POA: Diagnosis present

## 2016-09-09 DIAGNOSIS — Z8042 Family history of malignant neoplasm of prostate: Secondary | ICD-10-CM | POA: Diagnosis not present

## 2016-09-09 DIAGNOSIS — J189 Pneumonia, unspecified organism: Secondary | ICD-10-CM | POA: Diagnosis present

## 2016-09-09 DIAGNOSIS — D72829 Elevated white blood cell count, unspecified: Secondary | ICD-10-CM

## 2016-09-09 DIAGNOSIS — J181 Lobar pneumonia, unspecified organism: Secondary | ICD-10-CM

## 2016-09-09 DIAGNOSIS — J441 Chronic obstructive pulmonary disease with (acute) exacerbation: Secondary | ICD-10-CM | POA: Diagnosis present

## 2016-09-09 DIAGNOSIS — K219 Gastro-esophageal reflux disease without esophagitis: Secondary | ICD-10-CM | POA: Diagnosis present

## 2016-09-09 DIAGNOSIS — G4733 Obstructive sleep apnea (adult) (pediatric): Secondary | ICD-10-CM | POA: Diagnosis present

## 2016-09-09 DIAGNOSIS — E871 Hypo-osmolality and hyponatremia: Secondary | ICD-10-CM | POA: Diagnosis present

## 2016-09-09 DIAGNOSIS — D696 Thrombocytopenia, unspecified: Secondary | ICD-10-CM | POA: Diagnosis present

## 2016-09-09 DIAGNOSIS — Z7951 Long term (current) use of inhaled steroids: Secondary | ICD-10-CM

## 2016-09-09 DIAGNOSIS — R079 Chest pain, unspecified: Secondary | ICD-10-CM

## 2016-09-09 DIAGNOSIS — F17200 Nicotine dependence, unspecified, uncomplicated: Secondary | ICD-10-CM | POA: Diagnosis present

## 2016-09-09 DIAGNOSIS — I10 Essential (primary) hypertension: Secondary | ICD-10-CM | POA: Diagnosis present

## 2016-09-09 LAB — CBC WITH DIFFERENTIAL/PLATELET
Basophils Absolute: 0.1 10*3/uL (ref 0–0.1)
Basophils Relative: 0 %
EOS PCT: 0 %
Eosinophils Absolute: 0 10*3/uL (ref 0–0.7)
HEMATOCRIT: 49.3 % (ref 40.0–52.0)
Hemoglobin: 16.8 g/dL (ref 13.0–18.0)
LYMPHS ABS: 0.6 10*3/uL — AB (ref 1.0–3.6)
LYMPHS PCT: 3 %
MCH: 31.3 pg (ref 26.0–34.0)
MCHC: 34.1 g/dL (ref 32.0–36.0)
MCV: 91.8 fL (ref 80.0–100.0)
MONO ABS: 1.1 10*3/uL — AB (ref 0.2–1.0)
Monocytes Relative: 5 %
NEUTROS ABS: 19.6 10*3/uL — AB (ref 1.4–6.5)
Neutrophils Relative %: 92 %
PLATELETS: 148 10*3/uL — AB (ref 150–440)
RBC: 5.38 MIL/uL (ref 4.40–5.90)
RDW: 15.1 % — AB (ref 11.5–14.5)
WBC: 21.4 10*3/uL — ABNORMAL HIGH (ref 3.8–10.6)

## 2016-09-09 LAB — PROTIME-INR
INR: 1.11
Prothrombin Time: 14.4 seconds (ref 11.4–15.2)

## 2016-09-09 LAB — BASIC METABOLIC PANEL
Anion gap: 10 (ref 5–15)
BUN: 16 mg/dL (ref 6–20)
CO2: 27 mmol/L (ref 22–32)
Calcium: 9 mg/dL (ref 8.9–10.3)
Chloride: 92 mmol/L — ABNORMAL LOW (ref 101–111)
Creatinine, Ser: 1.02 mg/dL (ref 0.61–1.24)
GFR calc Af Amer: >60 mL/min (ref >60)
GLUCOSE: 99 mg/dL (ref 65–99)
POTASSIUM: 3.9 mmol/L (ref 3.5–5.1)
Sodium: 129 mmol/L — ABNORMAL LOW (ref 135–145)

## 2016-09-09 LAB — OSMOLALITY, URINE: Osmolality, Ur: 232 mOsm/kg — ABNORMAL LOW (ref 300–900)

## 2016-09-09 LAB — LACTIC ACID, PLASMA: LACTIC ACID, VENOUS: 1.5 mmol/L (ref 0.5–1.9)

## 2016-09-09 LAB — TROPONIN I: Troponin I: <0.03 ng/mL (ref <0.03)

## 2016-09-09 LAB — SODIUM, URINE, RANDOM: SODIUM UR: 35 mmol/L

## 2016-09-09 MED ORDER — ENOXAPARIN SODIUM 40 MG/0.4ML ~~LOC~~ SOLN: 40.0000 mg | SUBCUTANEOUS | Status: DC

## 2016-09-09 MED ORDER — IOPAMIDOL (ISOVUE-370) INJECTION 76%
75.0000 mL | Freq: Once | INTRAVENOUS | Status: AC | PRN
  Administered 2016-09-09: 75 mL via INTRAVENOUS

## 2016-09-09 MED ORDER — IPRATROPIUM-ALBUTEROL 0.5-2.5 (3) MG/3ML IN SOLN
3.0000 mL | Freq: Four times a day (QID) | RESPIRATORY_TRACT | Status: DC
  Administered 2016-09-09 – 2016-09-11 (×5): 3 mL via RESPIRATORY_TRACT
  Filled 2016-09-09 (×6): qty 3

## 2016-09-09 MED ORDER — FLUTICASONE FUROATE-VILANTEROL 200-25 MCG/INH IN AEPB
1.0000 | INHALATION_SPRAY | Freq: Every day | RESPIRATORY_TRACT | Status: DC
  Administered 2016-09-09 – 2016-09-11 (×3): 1 via RESPIRATORY_TRACT
  Filled 2016-09-09: qty 28

## 2016-09-09 MED ORDER — SODIUM CHLORIDE 0.9 % IV SOLN
INTRAVENOUS | Status: DC
Start: 2016-09-09 — End: 2016-09-10
  Administered 2016-09-09 – 2016-09-10 (×2): via INTRAVENOUS

## 2016-09-09 MED ORDER — OXYCODONE HCL 5 MG PO TABS
5.0000 mg | ORAL_TABLET | ORAL | Status: DC | PRN
  Administered 2016-09-09 – 2016-09-11 (×6): 5 mg via ORAL
  Filled 2016-09-09 (×7): qty 1

## 2016-09-09 MED ORDER — SODIUM CHLORIDE 0.9 % IV BOLUS (SEPSIS)
1000.0000 mL | Freq: Once | INTRAVENOUS | Status: AC
  Administered 2016-09-09: 1000 mL via INTRAVENOUS

## 2016-09-09 MED ORDER — PROPRANOLOL HCL ER 80 MG PO CP24
80.0000 mg | ORAL_CAPSULE | Freq: Every day | ORAL | Status: DC
  Administered 2016-09-09: 17:00:00 80 mg via ORAL
  Filled 2016-09-09: qty 1

## 2016-09-09 MED ORDER — NICOTINE 21 MG/24HR TD PT24
21.0000 mg | MEDICATED_PATCH | Freq: Every day | TRANSDERMAL | Status: DC
  Administered 2016-09-09 – 2016-09-11 (×3): 21 mg via TRANSDERMAL
  Filled 2016-09-09 (×3): qty 1

## 2016-09-09 MED ORDER — GABAPENTIN 300 MG PO CAPS
600.0000 mg | ORAL_CAPSULE | Freq: Every day | ORAL | Status: DC
  Administered 2016-09-09: 21:00:00 600 mg via ORAL
  Filled 2016-09-09: qty 2

## 2016-09-09 MED ORDER — MORPHINE SULFATE (PF) 4 MG/ML IV SOLN
4.0000 mg | Freq: Once | INTRAVENOUS | Status: AC
Start: 2016-09-09 — End: 2016-09-09
  Administered 2016-09-09: 4 mg via INTRAVENOUS
  Filled 2016-09-09: qty 1

## 2016-09-09 MED ORDER — DIAZEPAM 5 MG PO TABS: 5.0000 mg | ORAL_TABLET | Freq: Four times a day (QID) | ORAL | Status: DC | PRN

## 2016-09-09 MED ORDER — LEVOFLOXACIN IN D5W 750 MG/150ML IV SOLN
750.0000 mg | Freq: Once | INTRAVENOUS | Status: AC
  Administered 2016-09-09: 750 mg via INTRAVENOUS
  Filled 2016-09-09: qty 150

## 2016-09-09 MED ORDER — FENTANYL CITRATE (PF) 100 MCG/2ML IJ SOLN
50.0000 ug | Freq: Once | INTRAMUSCULAR | Status: AC
  Administered 2016-09-09: 50 ug via INTRAVENOUS
  Filled 2016-09-09: qty 2

## 2016-09-09 MED ORDER — IPRATROPIUM-ALBUTEROL 0.5-2.5 (3) MG/3ML IN SOLN
3.0000 mL | Freq: Once | RESPIRATORY_TRACT | Status: AC
  Administered 2016-09-09: 3 mL via RESPIRATORY_TRACT
  Filled 2016-09-09: qty 3

## 2016-09-09 MED ORDER — ALBUTEROL SULFATE (2.5 MG/3ML) 0.083% IN NEBU
2.5000 mg | INHALATION_SOLUTION | Freq: Four times a day (QID) | RESPIRATORY_TRACT | Status: DC | PRN
  Administered 2016-09-10: 2.5 mg via RESPIRATORY_TRACT

## 2016-09-09 MED ORDER — LEVOFLOXACIN IN D5W 750 MG/150ML IV SOLN
750.0000 mg | INTRAVENOUS | Status: DC
  Administered 2016-09-10 – 2016-09-11 (×2): 750 mg via INTRAVENOUS
  Filled 2016-09-09 (×2): qty 150

## 2016-09-09 MED ORDER — SODIUM CHLORIDE 0.9% FLUSH
3.0000 mL | Freq: Two times a day (BID) | INTRAVENOUS | Status: DC
  Administered 2016-09-09 – 2016-09-11 (×4): 3 mL via INTRAVENOUS

## 2016-09-09 MED ORDER — PANTOPRAZOLE SODIUM 40 MG PO TBEC
40.0000 mg | DELAYED_RELEASE_TABLET | Freq: Every day | ORAL | Status: DC
  Administered 2016-09-09 – 2016-09-11 (×3): 40 mg via ORAL
  Filled 2016-09-09 (×3): qty 1

## 2016-09-09 MED ORDER — ONDANSETRON HCL 4 MG/2ML IJ SOLN: 4.0000 mg | Freq: Four times a day (QID) | INTRAMUSCULAR | Status: DC | PRN

## 2016-09-09 MED ORDER — ENOXAPARIN SODIUM 40 MG/0.4ML ~~LOC~~ SOLN
40.0000 mg | SUBCUTANEOUS | Status: DC
  Administered 2016-09-09 – 2016-09-10 (×2): 40 mg via SUBCUTANEOUS
  Filled 2016-09-09 (×2): qty 0.4

## 2016-09-09 MED ORDER — ACETAMINOPHEN 325 MG PO TABS
650.0000 mg | ORAL_TABLET | Freq: Four times a day (QID) | ORAL | Status: DC | PRN
  Administered 2016-09-09 – 2016-09-11 (×4): 650 mg via ORAL
  Filled 2016-09-09 (×4): qty 2

## 2016-09-09 MED ORDER — IPRATROPIUM-ALBUTEROL 0.5-2.5 (3) MG/3ML IN SOLN
3.0000 mL | RESPIRATORY_TRACT | Status: DC
  Filled 2016-09-09: qty 3

## 2016-09-09 MED ORDER — ACETAMINOPHEN 650 MG RE SUPP: 650.0000 mg | Freq: Four times a day (QID) | RECTAL | Status: DC | PRN

## 2016-09-09 MED ORDER — METHOCARBAMOL 500 MG PO TABS
500.0000 mg | ORAL_TABLET | Freq: Four times a day (QID) | ORAL | Status: DC | PRN
  Administered 2016-09-10: 09:00:00 500 mg via ORAL
  Filled 2016-09-09: qty 1

## 2016-09-09 MED ORDER — ATORVASTATIN CALCIUM 20 MG PO TABS
40.0000 mg | ORAL_TABLET | Freq: Every day | ORAL | Status: DC
  Administered 2016-09-09 – 2016-09-11 (×3): 40 mg via ORAL
  Filled 2016-09-09 (×3): qty 2

## 2016-09-09 MED ORDER — ONDANSETRON HCL 4 MG PO TABS: 4.0000 mg | ORAL_TABLET | Freq: Four times a day (QID) | ORAL | Status: DC | PRN

## 2016-09-09 MED ORDER — GUAIFENESIN-CODEINE 100-10 MG/5ML PO SOLN
10.0000 mL | ORAL | Status: DC | PRN
  Administered 2016-09-09 – 2016-09-11 (×6): 10 mL via ORAL
  Filled 2016-09-09 (×6): qty 10

## 2016-09-09 NOTE — ED Provider Notes (Signed)
[facility] Emergency Department Provider Note  ____________________________________________   I have reviewed the triage vital signs and the nursing notes.   HISTORY  Chief Complaint Shortness of Breath    HPI Paul Carpenter is a 60 y.o. male History of tobacco abuse COPD, neck surgery shoulder surgery, hypertension, and a somewhat calm. Recent cancer workup which according to patient by last bronchoscopy was thought to be negative for malignancy. Patient is thought to have had something in the left upper lobe of his lung. He is here today, because he has had a cough, chest wall pain with coughing, low-grade fever, shortness of breath, mild nasal congestion,and was diagnosed with a high white count and pneumonia by outside facility and sent here for antibiotics and admission. Patient has had these symptoms for 3 or 4 days. It hurts in his left chest when he touches it no radiation. He can exactly designate the rib area of the left rib cage that hurts. He denies any leg swelling or history of blood clots in himself or his family.      Past Medical History:  Diagnosis Date  . Anginal pain ([facility])   . Anxiety   . Chicken pox   . Complication of anesthesia    o2 dropped after neck fusion  . COPD (chronic obstructive pulmonary disease) ([facility])   . Coronary artery disease   . Cough    chronic  clear phlegm  . Dysrhythmia   . GERD (gastroesophageal reflux disease)    h/o reflux/ hoarsness  . Hematochezia   . Hemorrhoids   . Hoarseness   . Hypertension   . Migraines   . OSA (obstructive sleep apnea)    has CPAP but does not use  . Personal history of tobacco use, presenting hazards to health 03/05/2016  . Pneumonia    5/17  . Raynaud disease   . Raynaud's disease   . Rotator cuff tear    on right  . Shortness of breath dyspnea   . Sleep apnea   . Ulcer (traumatic) of oral mucosa     Patient Active Problem List   Diagnosis Date Noted  . Cigarette  smoker 06/11/2016  . Upper respiratory tract infection 04/13/2016  . Personal history of tobacco use, presenting hazards to health 03/05/2016  . Polycythemia, secondary 03/24/2015  . Chest pain 12/06/2013  . Mass of upper lobe of left lung 12/06/2013  . COPD (chronic obstructive pulmonary disease) ([facility]) 04/24/2013  . Cough 04/24/2013  . Tobacco abuse 04/24/2013  . Obstructive sleep apnea 04/24/2013    Past Surgical History:  Procedure Laterality Date  . BACK SURGERY     cervical fusion x 2  . CARDIAC CATHETERIZATION    . CERVICAL DISCECTOMY    . COLONOSCOPY    . COLONOSCOPY N/A 07/25/2015   Procedure: COLONOSCOPY;  Surgeon: Martin U Skulskie, MD;  Location: [facility] ENDOSCOPY;  Service: Endoscopy;  Laterality: N/A;  . ELECTROMAGNETIC NAVIGATION BROCHOSCOPY Left 06/28/2016   Procedure: ELECTROMAGNETIC NAVIGATION BRONCHOSCOPY;  Surgeon: Vishal Mungal, MD;  Location: [facility] ORS;  Service: Cardiopulmonary;  Laterality: Left;  . ESOPHAGOGASTRODUODENOSCOPY N/A 07/25/2015   Procedure: ESOPHAGOGASTRODUODENOSCOPY (EGD);  Surgeon: Martin U Skulskie, MD;  Location: [facility] ENDOSCOPY;  Service: Endoscopy;  Laterality: N/A;  . NASAL SINUS SURGERY     x 2   . NECK SURGERY    . SEPTOPLASTY    . SKIN GRAFT      Prior to Admission medications   Medication Sig Start Date End Date   Taking? Authorizing Provider  albuterol (PROAIR HFA) 108 (90 Base) MCG/ACT inhaler Inhale 2 puffs into the lungs every 6 (six) hours as needed for wheezing or shortness of breath. 08/02/16   Vishal Mungal, MD  atorvastatin (LIPITOR) 40 MG tablet Take 1 tablet by mouth daily. 03/30/13   Historical Provider, MD  diazepam (VALIUM) 5 MG tablet Take 5 mg by mouth every 6 (six) hours as needed for anxiety.    Historical Provider, MD  fluticasone furoate-vilanterol (BREO ELLIPTA) 200-25 MCG/INH AEPB Inhale 1 puff into the lungs daily. 08/02/16   Vishal Mungal, MD  gabapentin (NEURONTIN) 300 MG capsule Take 600 mg by mouth at bedtime.     Historical Provider, MD  hydrochlorothiazide (MICROZIDE) 12.5 MG capsule Take 1 capsule by mouth daily. 01/29/13   Historical Provider, MD  methocarbamol (ROBAXIN) 500 MG tablet Take 500 mg by mouth every 6 (six) hours as needed for muscle spasms.    Historical Provider, MD  nicotine (NICODERM CQ - DOSED IN MG/24 HOURS) 21 mg/24hr patch Place 1 patch on skin for 2 weeks. 08/02/16   Vilinda Boehringer, MD  Nicotine 21-14-7 MG/24HR KIT Place 1 kit onto the skin as directed. 06/11/16   Vishal Mungal, MD  oxyCODONE (OXY IR/ROXICODONE) 5 MG immediate release tablet Take 5 mg by mouth every 4 (four) hours as needed (pain).     Historical Provider, MD  pantoprazole (PROTONIX) 20 MG tablet Take 20 mg by mouth daily.  03/30/13   Historical Provider, MD  propranolol ER (INDERAL LA) 80 MG 24 hr capsule Take 80 mg by mouth daily. 04/27/15   Historical Provider, MD  testosterone enanthate (DELATESTRYL) 200 MG/ML injection Inject 1.3 mg into the muscle every 14 (fourteen) days. For IM use only    Historical Provider, MD    Allergies Aspirin; Lisinopril; and Varenicline  Family History  Problem Relation Age of Onset  . Heart disease Father   . Prostate cancer Father   . Heart disease Paternal Grandmother   . Heart attack Maternal Grandfather 52    Social History Social History  Substance Use Topics  . Smoking status: Current Every Day Smoker    Packs/day: 2.00    Years: 45.00    Types: Cigarettes  . Smokeless tobacco: Never Used  . Alcohol use 1.2 oz/week    2 Standard drinks or equivalent per week    Review of Systems Constitutional: No fever/chills Eyes: No visual changes. ENT: No sore throat. No stiff neck no neck pain Cardiovascular: see hpi regarding chest pain. Respiratory: see hpi regarding shortness of breath. Gastrointestinal:   no vomiting.  No diarrhea.  No constipation. Genitourinary: Negative for dysuria. Musculoskeletal: Negative lower extremity swelling Skin: Negative for  rash. Neurological: Negative for severe headaches, focal weakness or numbness. 10-point ROS otherwise negative.  ____________________________________________   PHYSICAL EXAM:  VITAL SIGNS: ED Triage Vitals  Enc Vitals Group     BP 09/09/16 1112 (!) 146/92     Pulse Rate 09/09/16 1112 (!) 117     Resp 09/09/16 1112 20     Temp 09/09/16 1112 98.8 F (37.1 C)     Temp Source 09/09/16 1112 Oral     SpO2 09/09/16 1112 97 %     Weight 09/09/16 1113 189 lb (85.7 kg)     Height 09/09/16 1113 6' (1.829 m)     Head Circumference --      Peak Flow --      Pain Score 09/09/16 1113 5  Pain Loc --      Pain Edu? --      Excl. in [place]? --     Constitutional: Alert and oriented. Nontoxic but appears as if he feels unwell. Eyes: Conjunctivae are normal. PERRL. EOMI. Head: Atraumatic. Nose: No congestion/rhinnorhea. Mouth/Throat: Mucous membranes are moist.  Oropharynx non-erythematous. Neck: No stridor.   Nontender with no meningismus Cardiovascular: slightly elevated rate, regular rhythm. Grossly normal heart sounds.  Good peripheral circulation. Chest; pt with reproducible chest wall pain on the L ribs anteriorly.  When he changes position or I touch that area he says "ouch that is the pain right there'  No crepitous no flail chest no palpated rib fx.   Respiratory: mild tacypnea noted, + slight diffuse wheeze. Diminished in the bases.  + occasional L sided ronchi Abdominal: Soft and nontender. No distention. No guarding no rebound Back:  There is no focal tenderness or step off.  there is no midline tenderness there are no lesions noted. there is no CVA tenderness Musculoskeletal: No lower extremity tenderness, no upper extremity tenderness. No joint effusions, no DVT signs strong distal pulses no edema Neurologic:  Normal speech and language. No gross focal neurologic deficits are appreciated.  Skin:  Skin is warm, dry and intact. No rash noted. Psychiatric: Mood and affect are normal.  Speech and behavior are normal.  ____________________________________________   LABS (all labs ordered are listed, but only abnormal results are displayed)  Labs Reviewed  CBC WITH DIFFERENTIAL/PLATELET - Abnormal; Notable for the following:       Result Value   WBC 21.4 (*)    RDW 15.1 (*)    Platelets 148 (*)    Neutro Abs 19.6 (*)    Lymphs Abs 0.6 (*)    Monocytes Absolute 1.1 (*)    All other components within normal limits  BASIC METABOLIC PANEL - Abnormal; Notable for the following:    Sodium 129 (*)    Chloride 92 (*)    All other components within normal limits  CULTURE, BLOOD (ROUTINE X 2)  CULTURE, BLOOD (ROUTINE X 2)  TROPONIN I  PROTIME-INR  LACTIC ACID, PLASMA   ____________________________________________  EKG  I personally interpreted any EKGs ordered by me or triage Normal sinus cardia rate 108, no acute ST elevation or depression normal axis unremarkable EKG aside from mild tachycardia ____________________________________________  RADIOLOGY  I reviewed any imaging ordered by me or triage that were performed during my shift and, if possible, patient and/or family made aware of any abnormal findings. ____________________________________________   PROCEDURES  Procedure(s) performed: None  Procedures  Critical Care performed: None  ____________________________________________   INITIAL IMPRESSION / ASSESSMENT AND PLAN / ED COURSE  Pertinent labs & imaging results that were available during my care of the patient were reviewed by me and considered in my medical decision making (see chart for details).  Patient with 2 complaints. The first is cough, he does have elevated white count and likely pneumonia on chest x-ray. I'm giving him antibiotics IV fluid lactic is negative blood pressure stable he does however when he changes position her respiratory rate in the mid 30s and he remains mildly tachycardic. We'll give him more fluid. He also is very  reproducible chest wall pain, which have low suspicion for PE. However, given his tachycardia, and the fact that his pneumonia if that is what really is, finds itself located exactly where the left upper lobe nodule was they have been working up I think it but  whose S to obtain more imaging although I understand the patient has had little CT scans in the past. For this reason I will do a CT scan to rule out other pathology that could look similar or minimal pneumonia. Either way I think the patient would benefit from admission to the hospital given his ongoing chest wall pain, and his shortness of breath. In addition, it is noted that the patient has a negative troponin and no EKG changes suggestive of ACS, which is what is expected. Discussed with the hospitalist service with, agreed management and admission   Clinical Course    ____________________________________________   FINAL CLINICAL IMPRESSION(S) / ED DIAGNOSES  Final diagnoses:  None      This chart was dictated using voice recognition software.  Despite best efforts to proofread,  errors can occur which can change meaning.      Schuyler Amor, MD 09/09/16 1355

## 2016-09-09 NOTE — ED Triage Notes (Signed)
Sent from [facility] for pneumonia with wbc 22.

## 2016-09-09 NOTE — H&P (Signed)
[facility] [place] NAME: Paul Carpenter    MR#:  [id number]  DATE OF BIRTH:  1956-08-29   DATE OF ADMISSION:  09/09/2016  PRIMARY CARE PHYSICIAN: Kirk Ruths., MD   REQUESTING/REFERRING PHYSICIAN: Burlene Arnt  CHIEF COMPLAINT:   Chief Complaint  Patient presents with  . Shortness of Breath    HISTORY OF PRESENT ILLNESS:  Paul Carpenter  is a 60 y.o. male with a known history of copd non oxygen requiring presenting with cough, shortness of breath. 4-5 day duration shortness of breath, cough, productive yellowish sputum, fever, chills. In ED meeting sepsis criteria Left chest pain with coughing only  PAST MEDICAL HISTORY:   Past Medical History:  Diagnosis Date  . Anginal pain ([place])   . Anxiety   . Chicken pox   . Complication of anesthesia    o2 dropped after neck fusion  . COPD (chronic obstructive pulmonary disease) ([place])   . Coronary artery disease   . Cough    chronic  clear phlegm  . Dysrhythmia   . GERD (gastroesophageal reflux disease)    h/o reflux/ hoarsness  . Hematochezia   . Hemorrhoids   . Hoarseness   . Hypertension   . Migraines   . OSA (obstructive sleep apnea)    has CPAP but does not use  . Personal history of tobacco use, presenting hazards to health 03/05/2016  . Pneumonia    5/17  . Raynaud disease   . Raynaud's disease   . Rotator cuff tear    on right  . Shortness of breath dyspnea   . Sleep apnea   . Ulcer (traumatic) of oral mucosa     PAST SURGICAL HISTORY:   Past Surgical History:  Procedure Laterality Date  . BACK SURGERY     cervical fusion x 2  . CARDIAC CATHETERIZATION    . CERVICAL DISCECTOMY    . COLONOSCOPY    . COLONOSCOPY N/A 07/25/2015   Procedure: COLONOSCOPY;  Surgeon: Lollie Sails, MD;  Location: [facility] ENDOSCOPY;  Service: Endoscopy;  Laterality: N/A;  . ELECTROMAGNETIC NAVIGATION BROCHOSCOPY Left 06/28/2016   Procedure: ELECTROMAGNETIC NAVIGATION  BRONCHOSCOPY;  Surgeon: Vilinda Boehringer, MD;  Location: [facility] ORS;  Service: Cardiopulmonary;  Laterality: Left;  . ESOPHAGOGASTRODUODENOSCOPY N/A 07/25/2015   Procedure: ESOPHAGOGASTRODUODENOSCOPY (EGD);  Surgeon: Lollie Sails, MD;  Location: [facility] ENDOSCOPY;  Service: Endoscopy;  Laterality: N/A;  . NASAL SINUS SURGERY     x 2   . NECK SURGERY    . SEPTOPLASTY    . SKIN GRAFT      SOCIAL HISTORY:   Social History  Substance Use Topics  . Smoking status: Current Every Day Smoker    Packs/day: 2.00    Years: 45.00    Types: Cigarettes  . Smokeless tobacco: Never Used  . Alcohol use 1.2 oz/week    2 Standard drinks or equivalent per week    FAMILY HISTORY:   Family History  Problem Relation Age of Onset  . Heart disease Father   . Prostate cancer Father   . Heart disease Paternal Grandmother   . Heart attack Maternal Grandfather 52    DRUG ALLERGIES:   Allergies  Allergen Reactions  . Aspirin Other (See Comments)  . Lisinopril Other (See Comments)  . Varenicline Other (See Comments)    REVIEW OF SYSTEMS:  REVIEW OF SYSTEMS:  CONSTITUTIONAL: positive fevers, chills, fatigue, weakness.  EYES: Denies blurred vision, double vision, or eye pain.  EARS, NOSE, THROAT: Denies tinnitus, ear pain, hearing loss.  RESPIRATORY: positive cough, shortness of breath, denies wheezing  CARDIOVASCULAR: Denies chest pain, palpitations, edema.  GASTROINTESTINAL: Denies nausea, vomiting, diarrhea, abdominal pain.  GENITOURINARY: Denies dysuria, hematuria.  ENDOCRINE: Denies nocturia or thyroid problems. HEMATOLOGIC AND LYMPHATIC: Denies easy bruising or bleeding.  SKIN: Denies rash or lesions.  MUSCULOSKELETAL: Denies pain in neck, back, shoulder, knees, hips, or further arthritic symptoms.  NEUROLOGIC: Denies paralysis, paresthesias.  PSYCHIATRIC: Denies anxiety or depressive symptoms. Otherwise full review of systems performed by me is negative.   MEDICATIONS AT HOME:   Prior  to Admission medications   Medication Sig Start Date End Date Taking? Authorizing Provider  albuterol (PROAIR HFA) 108 (90 Base) MCG/ACT inhaler Inhale 2 puffs into the lungs every 6 (six) hours as needed for wheezing or shortness of breath. 08/02/16  Yes Vishal Mungal, MD  atorvastatin (LIPITOR) 40 MG tablet Take 1 tablet by mouth daily. 03/30/13  Yes Historical Provider, MD  diazepam (VALIUM) 5 MG tablet Take 5 mg by mouth every 6 (six) hours as needed for anxiety.   Yes Historical Provider, MD  fluticasone furoate-vilanterol (BREO ELLIPTA) 200-25 MCG/INH AEPB Inhale 1 puff into the lungs daily. 08/02/16  Yes Vishal Mungal, MD  hydrochlorothiazide (MICROZIDE) 12.5 MG capsule Take 1 capsule by mouth daily. 01/29/13  Yes Historical Provider, MD  methocarbamol (ROBAXIN) 500 MG tablet Take 500 mg by mouth every 6 (six) hours as needed for muscle spasms.   Yes Historical Provider, MD  metoprolol tartrate (LOPRESSOR) 25 MG tablet Take 25 mg by mouth 2 (two) times daily.   Yes Historical Provider, MD  nicotine (NICODERM CQ - DOSED IN MG/24 HOURS) 21 mg/24hr patch Place 1 patch on skin for 2 weeks. 08/02/16  Yes Vishal Mungal, MD  oxyCODONE (OXY IR/ROXICODONE) 5 MG immediate release tablet Take 5 mg by mouth every 4 (four) hours as needed (pain).    Yes Historical Provider, MD  pantoprazole (PROTONIX) 20 MG tablet Take 20 mg by mouth daily.  03/30/13  Yes Historical Provider, MD  testosterone enanthate (DELATESTRYL) 200 MG/ML injection Inject 1.3 mg into the muscle every 14 (fourteen) days. For IM use only   Yes Historical Provider, MD      VITAL SIGNS:  Blood pressure 131/87, pulse (!) 111, temperature 98.8 F (37.1 C), temperature source Oral, resp. rate 19, height 6' (1.829 m), weight 85.7 kg (189 lb), SpO2 98 %.  PHYSICAL EXAMINATION:  VITAL SIGNS: Vitals:   09/09/16 1345 09/09/16 1500  BP: (!) 128/91 131/87  Pulse: (!) 117 (!) 111  Resp: (!) 21 19  Temp:     GENERAL:60 y.o.male currently in no  acute distress.  HEAD: Normocephalic, atraumatic.  EYES: Pupils equal, round, reactive to light. Extraocular muscles intact. No scleral icterus.  MOUTH: Moist mucosal membrane. Dentition intact. No abscess noted.  EAR, NOSE, THROAT: Clear without exudates. No external lesions.  NECK: Supple. No thyromegaly. No nodules. No JVD.  PULMONARY: greatly diminished breath sounds with coarse rhonchi - left chest without wheeze No use of accessory muscles, Good respiratory effort. good air entry bilaterally CHEST: Nontender to palpation.  CARDIOVASCULAR: S1 and S2. tachycardia No murmurs, rubs, or gallops. No edema. Pedal pulses 2+ bilaterally.  GASTROINTESTINAL: Soft, nontender, nondistended. No masses. Positive bowel sounds. No hepatosplenomegaly.  MUSCULOSKELETAL: No swelling, clubbing, or edema. Range of motion full in all extremities.  NEUROLOGIC: Cranial nerves II through XII are intact. No gross focal neurological deficits. Sensation intact. Reflexes intact.  SKIN: No ulceration, lesions, rashes, or cyanosis. Skin warm and dry. Turgor intact.  PSYCHIATRIC: Mood, affect within normal limits. The patient is awake, alert and oriented x 3. Insight, judgment intact.    LABORATORY PANEL:   CBC  Recent Labs Lab 09/09/16 1141  WBC 21.4*  HGB 16.8  HCT 49.3  PLT 148*   ------------------------------------------------------------------------------------------------------------------  Chemistries   Recent Labs Lab 09/09/16 1141  NA 129*  K 3.9  CL 92*  CO2 27  GLUCOSE 99  BUN 16  CREATININE 1.02  CALCIUM 9.0   ------------------------------------------------------------------------------------------------------------------  Cardiac Enzymes  Recent Labs Lab 09/09/16 1141  TROPONINI <0.03   ------------------------------------------------------------------------------------------------------------------  RADIOLOGY:  Dg Chest 2 View  Result Date: 09/09/2016 CLINICAL DATA:   Shortness of breath, possible pneumonia. History of COPD, current smoker EXAM: CHEST  2 VIEW COMPARISON:  Portable chest x-ray of June 28, 2016 FINDINGS: There is new infiltrate in the left upper lobe. The right lung is clear. There is no pleural effusion or pneumothorax. The heart and pulmonary vascularity are normal. The mediastinum is normal in width. The bony thorax exhibits no acute abnormality. IMPRESSION: Chronic bronchitic changes. Left upper lobe pneumonia. Followup PA and lateral chest X-ray is recommended in 3-4 weeks following trial of antibiotic therapy to ensure resolution and exclude underlying malignancy. Electronically Signed   By: David  Martinique M.D.   On: 09/09/2016 13:06   Ct Angio Chest Pe W And/or Wo Contrast  Result Date: 09/09/2016 CLINICAL DATA:  60 y.o. male History of tobacco abuse COPD, neck surgery shoulder surgery, hypertension, and a somewhat calm. Recent cancer workup which according to patient by last bronchoscopy was thought to be negative for malignancy. Patient is thought to have had something in the left upper lobe of his lung. He is here today, because he has had a cough, chest wall pain with coughing, low-grade fever, shortness of breath, mild nasal congestion,and was diagnosed with a high white count and pneumonia by outside facility and sent here for antibiotics and admission. Patient has had these symptoms for 3 or 4 days. It hurts in his left chest when he touches it no radiation. He can exactly designate the rib area of the left rib cage that hurts. He denies any leg swelling or history of blood clots in himself or his family. EXAM: CT ANGIOGRAPHY CHEST WITH CONTRAST TECHNIQUE: Multidetector CT imaging of the chest was performed using the standard protocol during bolus administration of intravenous contrast. Multiplanar CT image reconstructions and MIPs were obtained to evaluate the vascular anatomy. CONTRAST:  150 mL of Isovue 370 intravenous contrast COMPARISON:   Multiple prior chest CTs, most recent dated 08/23/2016 FINDINGS: Cardiovascular: Despite repeating the exam, there is still somewhat suboptimal opacification of the pulmonary arteries, which limits evaluation of the smaller segmental and subsegmental arteries for pulmonary emboli. Allowing for this limitation, there is no convincing pulmonary embolus. The great vessels are normal in caliber. No aortic dissection. Mild atherosclerotic calcifications noted along the aortic arch and descending thoracic aorta. Heart is normal in size. There are dense coronary artery calcifications. Mediastinum/Nodes: There are no mediastinal masses. There are scattered prominent to borderline enlarged mediastinal lymph nodes. Largest is a 1 cm short axis left peribronchial node. There are prominent to mildly enlarged left hilar lymph nodes, largest measuring 12 mm. Lungs/Pleura: There is consolidation in the posterior aspect of the left upper lobe and adjacent superior segment of the left lower lobe consistent with pneumonia, new since the most recent prior chest CT.  Irregular focal opacity noted in both upper lobes and apices, likely scarring, is stable from the prior exam. There is a small area of reticular opacity in the anterior right middle lobe, also stable and likely scarring. Minor dependent subsegmental atelectasis is noted in the lower lobes. Stable changes of emphysema. There is no pleural effusion. No pneumothorax. No evidence of pulmonary edema. Upper Abdomen: Several low-density liver lesions are noted, stable, consistent with cysts. No acute findings in the upper abdomen. Musculoskeletal: No chest wall abnormality. No acute or significant osseous findings. Review of the MIP images confirms the above findings. IMPRESSION: 1. Somewhat limited study as detailed above. No convincing pulmonary embolus. 2. Left upper lobe and left lower lobe superior segment pneumonia associated with reactive left hilar and mediastinal  adenopathy. These findings are new since the prior CT. 3. Stable small areas of apical lung scarring. Stable changes of emphysema. Electronically Signed   By: Lajean Manes M.D.   On: 09/09/2016 14:47    EKG:   Orders placed or performed during the hospital encounter of 09/09/16  . ED EKG  . ED EKG    IMPRESSION AND PLAN:   60 year old male with copd presenting with cough, fever  1. Sepsis, meeting septic criteria by HR, RR, WBC present on arrival. Source CAP Panculture. Broad-spectrum antibiotics including levaquin and taper antibiotics when culture data returns.  Has received a 30 mL/kg IV fluid bolus. Continue IV fluid hydration to keep mean arterial pressure greater than 65. may require pressor therapy if blood pressure worsens. We will repeat lactic acid if the initial is greater than 2.2.  Breathing treatments, will hold on steroids given no wheze  2. Tobacco abuse: smokes 2 ppd, 4 min spent on cessation  3. Hyperlipidemia unspecified: statin 4.gerd without esophagitis: ppi    All the records are reviewed and case discussed with ED provider. Management plans discussed with the patient, family and they are in agreement.  CODE STATUS: fuull  TOTAL TIME TAKING CARE OF THIS PATIENT: 33 minutes.    Hower,  Karenann Cai.D on 09/09/2016 at 3:20 PM  Between 7am to 6pm - Pager - [phone]  After 6pm: House Pager: - [phone]  [facility]  Office  [phone]  CC: Primary care physician; Kirk Ruths., MD

## 2016-09-10 LAB — BASIC METABOLIC PANEL
ANION GAP: 6 (ref 5–15)
BUN: 10 mg/dL (ref 6–20)
CO2: 26 mmol/L (ref 22–32)
Calcium: 8.2 mg/dL — ABNORMAL LOW (ref 8.9–10.3)
Chloride: 99 mmol/L — ABNORMAL LOW (ref 101–111)
Creatinine, Ser: 0.73 mg/dL (ref 0.61–1.24)
GFR calc Af Amer: >60 mL/min (ref >60)
GFR calc non Af Amer: >60 mL/min (ref >60)
GLUCOSE: 105 mg/dL — AB (ref 65–99)
POTASSIUM: 3.8 mmol/L (ref 3.5–5.1)
Sodium: 131 mmol/L — ABNORMAL LOW (ref 135–145)

## 2016-09-10 LAB — CBC
HEMATOCRIT: 44.1 % (ref 40.0–52.0)
HEMOGLOBIN: 14.9 g/dL (ref 13.0–18.0)
MCH: 31.3 pg (ref 26.0–34.0)
MCHC: 33.7 g/dL (ref 32.0–36.0)
MCV: 92.7 fL (ref 80.0–100.0)
Platelets: 124 10*3/uL — ABNORMAL LOW (ref 150–440)
RBC: 4.76 MIL/uL (ref 4.40–5.90)
RDW: 15 % — ABNORMAL HIGH (ref 11.5–14.5)
WBC: 15.5 10*3/uL — ABNORMAL HIGH (ref 3.8–10.6)

## 2016-09-10 LAB — TROPONIN I
Troponin I: <0.03 ng/mL (ref <0.03)
Troponin I: <0.03 ng/mL (ref <0.03)

## 2016-09-10 MED ORDER — HYDROCHLOROTHIAZIDE 12.5 MG PO CAPS
12.5000 mg | ORAL_CAPSULE | Freq: Every day | ORAL | Status: DC
  Administered 2016-09-10 – 2016-09-11 (×2): 12.5 mg via ORAL
  Filled 2016-09-10 (×2): qty 1

## 2016-09-10 MED ORDER — ZOLPIDEM TARTRATE 5 MG PO TABS
5.0000 mg | ORAL_TABLET | Freq: Every evening | ORAL | Status: DC | PRN
  Administered 2016-09-10: 23:00:00 5 mg via ORAL
  Filled 2016-09-10: qty 1

## 2016-09-10 MED ORDER — ALBUTEROL SULFATE (2.5 MG/3ML) 0.083% IN NEBU
2.5000 mg | INHALATION_SOLUTION | RESPIRATORY_TRACT | Status: DC | PRN
  Administered 2016-09-10: 2.5 mg via RESPIRATORY_TRACT
  Filled 2016-09-10: qty 3

## 2016-09-10 MED ORDER — METOPROLOL TARTRATE 25 MG PO TABS
25.0000 mg | ORAL_TABLET | Freq: Two times a day (BID) | ORAL | Status: DC
  Administered 2016-09-10 – 2016-09-11 (×3): 25 mg via ORAL
  Filled 2016-09-10 (×3): qty 1

## 2016-09-10 MED ORDER — MORPHINE SULFATE (PF) 4 MG/ML IV SOLN
1.0000 mg | INTRAVENOUS | Status: DC | PRN
  Administered 2016-09-10: 02:00:00 1 mg via INTRAVENOUS
  Filled 2016-09-10: qty 1

## 2016-09-10 NOTE — Care Management (Signed)
Presented to ED from PCP office.  Admitted with sepsis due to pneumonia. On room air- not requiring oxygen.   Independent in all adls, denies issues accessing medical care, obtaining medications or with transportation.  Current with  PCP.

## 2016-09-10 NOTE — Progress Notes (Signed)
[place] [facility] [place] NAME: Karol Liendo    MR#:  [id number]  DATE OF BIRTH:  11-18-1955  SUBJECTIVE:  CHIEF COMPLAINT:   Chief Complaint  Patient presents with  . Shortness of Breath   Better cough and shortness of breath. REVIEW OF SYSTEMS:  Review of Systems  Constitutional: Negative for chills, fever and malaise/fatigue.  HENT: Negative for congestion.   Eyes: Negative for blurred vision.  Respiratory: Positive for cough and shortness of breath. Negative for hemoptysis, wheezing and stridor.   Cardiovascular: Negative for chest pain and leg swelling.  Gastrointestinal: Negative for abdominal pain, diarrhea, nausea and vomiting.  Genitourinary: Negative for dysuria.  Musculoskeletal: Positive for back pain.  Neurological: Negative for dizziness, focal weakness and loss of consciousness.  Psychiatric/Behavioral: Negative for depression. The patient is not nervous/anxious.     DRUG ALLERGIES:   Allergies  Allergen Reactions  . Aspirin Other (See Comments)  . Lisinopril Other (See Comments)  . Varenicline Other (See Comments)   VITALS:  Blood pressure (!) 144/80, pulse 96, temperature 100 F (37.8 C), temperature source Oral, resp. rate 18, height 6' (1.829 m), weight 189 lb (85.7 kg), SpO2 97 %. PHYSICAL EXAMINATION:  Physical Exam  Constitutional: He is oriented to person, place, and time and well-developed, well-nourished, and in no distress.  HENT:  Head: Normocephalic.  Mouth/Throat: Oropharynx is clear and moist.  Eyes: Conjunctivae and EOM are normal.  Neck: Normal range of motion. Neck supple. No JVD present. No tracheal deviation present.  Cardiovascular: Normal rate, regular rhythm and normal heart sounds.   Pulmonary/Chest: Effort normal and breath sounds normal. No respiratory distress. He has no wheezes. He has no rales.  Abdominal: Bowel sounds are normal. He exhibits no distension. There is no tenderness.    Musculoskeletal: Normal range of motion. He exhibits no edema or tenderness.  Neurological: He is alert and oriented to person, place, and time. No cranial nerve deficit.  Skin: No rash noted. No erythema.  Psychiatric: Affect and judgment normal.   LABORATORY PANEL:   CBC  Recent Labs Lab 09/10/16 0304  WBC 15.5*  HGB 14.9  HCT 44.1  PLT 124*   ------------------------------------------------------------------------------------------------------------------ Chemistries   Recent Labs Lab 09/10/16 0304  NA 131*  K 3.8  CL 99*  CO2 26  GLUCOSE 105*  BUN 10  CREATININE 0.73  CALCIUM 8.2*   RADIOLOGY:  Ct Angio Chest Pe W And/or Wo Contrast  Result Date: 09/09/2016 CLINICAL DATA:  60 y.o. male History of tobacco abuse COPD, neck surgery shoulder surgery, hypertension, and a somewhat calm. Recent cancer workup which according to patient by last bronchoscopy was thought to be negative for malignancy. Patient is thought to have had something in the left upper lobe of his lung. He is here today, because he has had a cough, chest wall pain with coughing, low-grade fever, shortness of breath, mild nasal congestion,and was diagnosed with a high white count and pneumonia by outside facility and sent here for antibiotics and admission. Patient has had these symptoms for 3 or 4 days. It hurts in his left chest when he touches it no radiation. He can exactly designate the rib area of the left rib cage that hurts. He denies any leg swelling or history of blood clots in himself or his family. EXAM: CT ANGIOGRAPHY CHEST WITH CONTRAST TECHNIQUE: Multidetector CT imaging of the chest was performed using the standard protocol during bolus administration of intravenous contrast. Multiplanar  CT image reconstructions and MIPs were obtained to evaluate the vascular anatomy. CONTRAST:  150 mL of Isovue 370 intravenous contrast COMPARISON:  Multiple prior chest CTs, most recent dated 08/23/2016 FINDINGS:  Cardiovascular: Despite repeating the exam, there is still somewhat suboptimal opacification of the pulmonary arteries, which limits evaluation of the smaller segmental and subsegmental arteries for pulmonary emboli. Allowing for this limitation, there is no convincing pulmonary embolus. The great vessels are normal in caliber. No aortic dissection. Mild atherosclerotic calcifications noted along the aortic arch and descending thoracic aorta. Heart is normal in size. There are dense coronary artery calcifications. Mediastinum/Nodes: There are no mediastinal masses. There are scattered prominent to borderline enlarged mediastinal lymph nodes. Largest is a 1 cm short axis left peribronchial node. There are prominent to mildly enlarged left hilar lymph nodes, largest measuring 12 mm. Lungs/Pleura: There is consolidation in the posterior aspect of the left upper lobe and adjacent superior segment of the left lower lobe consistent with pneumonia, new since the most recent prior chest CT. Irregular focal opacity noted in both upper lobes and apices, likely scarring, is stable from the prior exam. There is a small area of reticular opacity in the anterior right middle lobe, also stable and likely scarring. Minor dependent subsegmental atelectasis is noted in the lower lobes. Stable changes of emphysema. There is no pleural effusion. No pneumothorax. No evidence of pulmonary edema. Upper Abdomen: Several low-density liver lesions are noted, stable, consistent with cysts. No acute findings in the upper abdomen. Musculoskeletal: No chest wall abnormality. No acute or significant osseous findings. Review of the MIP images confirms the above findings. IMPRESSION: 1. Somewhat limited study as detailed above. No convincing pulmonary embolus. 2. Left upper lobe and left lower lobe superior segment pneumonia associated with reactive left hilar and mediastinal adenopathy. These findings are new since the prior CT. 3. Stable small  areas of apical lung scarring. Stable changes of emphysema. Electronically Signed   By: Lajean Manes M.D.   On: 09/09/2016 14:47   ASSESSMENT AND PLAN:   60 year old male with copd presenting with cough, fever  1. Sepsis with pneumonia, meeting septic criteria by HR, RR, WBC present on arrival.  Tinea levaquin, follow-up CBC and cultures. Continue IV fluid hydration.  2. Tobacco abuse: smokes 2 ppd, 4 min spent on cessation  3. Hyperlipidemia unspecified: statin 4.gerd without esophagitis: ppi  Hyponatremia. Continue normal saline IV and follow-up BMP.  All the records are reviewed and case discussed with Care Management/Social Worker. Management plans discussed with the patient, His wife and they are in agreement.  CODE STATUS: Full code  TOTAL TIME TAKING CARE OF THIS PATIENT: 37 minutes.   More than 50% of the time was spent in counseling/coordination of care: YES  POSSIBLE D/C IN 2 DAYS, DEPENDING ON CLINICAL CONDITION.   Demetrios Loll M.D on 09/10/2016 at 1:01 PM  Between 7am to 6pm - Pager - [phone]  After 6pm go to [email or web address] - Proofreader  [facility]  Office  [phone]  CC: Primary care physician; Kirk Ruths., MD  Note: This dictation was prepared with Dragon dictation along with smaller phrase technology. Any transcriptional errors that result from this process are unintentional.

## 2016-09-10 NOTE — Progress Notes (Signed)
While rounding, Colma made initial visit to room 108. Pt was somber. Wife was bedside. Pt was disappointed that he will not be discharged today. Pt is hopeful that his discharge will come tomorrow. Pt did not indicate a need for spiritual care at this time. ....[facility] is available for follow up as needed.    09/10/16 1300  Clinical Encounter Type  Visited With Patient;Patient and family together  Visit Type Initial;Spiritual support  Referral From Nurse  Spiritual Encounters  Spiritual Needs Emotional  Stress Factors  Patient Stress Factors Exhausted;Health changes

## 2016-09-10 NOTE — Plan of Care (Signed)
Problem: Pain Managment: Goal: General experience of comfort will improve Outcome: Progressing Tylenol given during shift. Improvement noted. Oxycodone given during shift. Improvement noted.  Problem: Fluid Volume: Goal: Ability to maintain a balanced intake and output will improve Outcome: Progressing Free H2O via Feeding Tube. Tolerating Well.

## 2016-09-11 DIAGNOSIS — J189 Pneumonia, unspecified organism: Secondary | ICD-10-CM

## 2016-09-11 DIAGNOSIS — D72829 Elevated white blood cell count, unspecified: Secondary | ICD-10-CM

## 2016-09-11 DIAGNOSIS — D696 Thrombocytopenia, unspecified: Secondary | ICD-10-CM

## 2016-09-11 DIAGNOSIS — E871 Hypo-osmolality and hyponatremia: Secondary | ICD-10-CM

## 2016-09-11 DIAGNOSIS — J441 Chronic obstructive pulmonary disease with (acute) exacerbation: Secondary | ICD-10-CM

## 2016-09-11 LAB — CBC
HEMATOCRIT: 43.3 % (ref 40.0–52.0)
HEMOGLOBIN: 14.9 g/dL (ref 13.0–18.0)
MCH: 31.3 pg (ref 26.0–34.0)
MCHC: 34.3 g/dL (ref 32.0–36.0)
MCV: 91.3 fL (ref 80.0–100.0)
Platelets: 146 10*3/uL — ABNORMAL LOW (ref 150–440)
RBC: 4.74 MIL/uL (ref 4.40–5.90)
RDW: 14.6 % — ABNORMAL HIGH (ref 11.5–14.5)
WBC: 11.7 10*3/uL — AB (ref 3.8–10.6)

## 2016-09-11 LAB — BASIC METABOLIC PANEL
ANION GAP: 8 (ref 5–15)
BUN: 8 mg/dL (ref 6–20)
CHLORIDE: 97 mmol/L — AB (ref 101–111)
CO2: 28 mmol/L (ref 22–32)
CREATININE: 0.62 mg/dL (ref 0.61–1.24)
Calcium: 8.4 mg/dL — ABNORMAL LOW (ref 8.9–10.3)
GFR calc non Af Amer: >60 mL/min (ref >60)
Glucose, Bld: 101 mg/dL — ABNORMAL HIGH (ref 65–99)
POTASSIUM: 3.6 mmol/L (ref 3.5–5.1)
SODIUM: 133 mmol/L — AB (ref 135–145)

## 2016-09-11 MED ORDER — IPRATROPIUM-ALBUTEROL 0.5-2.5 (3) MG/3ML IN SOLN: 3.0000 mL | Freq: Four times a day (QID) | RESPIRATORY_TRACT | 5 refills | Status: DC

## 2016-09-11 MED ORDER — GUAIFENESIN-CODEINE 100-10 MG/5ML PO SOLN: 10.0000 mL | ORAL | 0 refills | Status: DC | PRN

## 2016-09-11 MED ORDER — LEVOFLOXACIN 750 MG PO TABS: 750.0000 mg | ORAL_TABLET | Freq: Every day | ORAL | 0 refills | Status: DC

## 2016-09-11 NOTE — Progress Notes (Signed)
Discharge instructions given and went over with patient and wife at bedside. Prescription given. All questions answered. Patient discharged home with wife via wheelchair by nursing staff. Madlyn Frankel, RN

## 2016-09-11 NOTE — Discharge Summary (Signed)
[facility] NAME: Paul Carpenter    MR#:  [id number]  DATE OF BIRTH:  03/29/1956  DATE OF ADMISSION:  09/09/2016 ADMITTING PHYSICIAN: Lytle Butte, MD  DATE OF DISCHARGE: 09/11/2016 11:33 AM  PRIMARY CARE PHYSICIAN: Kirk Ruths., MD     ADMISSION DIAGNOSIS:  Shortness of breath [R06.02] Chest pain, unspecified type [R07.9] Community acquired pneumonia of left upper lobe of lung ([place]) [J18.1]  DISCHARGE DIAGNOSIS:  Principal Problem:   Sepsis ([place]) Active Problems:   CAP (community acquired pneumonia)   COPD exacerbation ([place])   Hyponatremia   Leukocytosis   Thrombocytopenia ([place])   SECONDARY DIAGNOSIS:   Past Medical History:  Diagnosis Date  . Anginal pain ([place])   . Anxiety   . Chicken pox   . Complication of anesthesia    o2 dropped after neck fusion  . COPD (chronic obstructive pulmonary disease) ([place])   . Coronary artery disease   . Cough    chronic  clear phlegm  . Dysrhythmia   . GERD (gastroesophageal reflux disease)    h/o reflux/ hoarsness  . Hematochezia   . Hemorrhoids   . Hoarseness   . Hypertension   . Migraines   . OSA (obstructive sleep apnea)    has CPAP but does not use  . Personal history of tobacco use, presenting hazards to health 03/05/2016  . Pneumonia    5/17  . Raynaud disease   . Raynaud's disease   . Rotator cuff tear    on right  . Shortness of breath dyspnea   . Sleep apnea   . Ulcer (traumatic) of oral mucosa     .pro HOSPITAL COURSE:   The patient is 60 year old male with past medical history significant for history of multiple medical problems including obstructive sleep apnea, tobacco abuse, Raynaud's disease, COPD, coronary artery disease, who presents to the hospital with complaints of cough and shortness of breath, productive yellow phlegm, fever and chills. Patient's labs revealed elevated white blood cell count of 21.4 thousand, hyponatremia with  sodium 129. Cardiac enzymes were normal. Lactic acid level was normal. Chest x-ray revealed chronic bronchitic changes, left upper lobe pneumonia, CT angiogram of the chest revealed no pulmonary embolism, left upper lobe and left lower lobe superior segment pneumonia, reactive, left hilar and mediastinal adenopathy. Patient was initiated on levofloxacin and his condition improved. His white blood cell count improved to 11.7 by the day of discharge . Sodium level has improved to 133. It was felt the patient is stable to be discharged home. Continue antibiotic therapy at home to complete course  Discussion by problem  #1. Sepsis due to left-sided pneumonia, whether cell count has improved with current antibiotic therapy, patient clinically improved. The patient's blood culture showed no growth 2 days  #2. Community-acquired pneumonia, patient is to continue antibiotic therapy with levofloxacin to complete course, he is to continue doing nebs every 4 hours and guaifenesin with codeine for cough suppression. Unfortunately, sputum cultures were not obtained, however, patient clinically has improved . Oxygenation remained stable, O2 sats were 94-96% on room air #3. Hyponatremia, improved with therapy, HCTZ was stopped, recheck sodium level as outpatient  #4 thrombocytopenia, seems to be stable  #5. Leukocytosis, resolving  DISCHARGE CONDITIONS:   Stable  CONSULTS OBTAINED:  Treatment Team:  Lytle Butte, MD  DRUG ALLERGIES:   Allergies  Allergen Reactions  . Aspirin Other (See Comments)  . Lisinopril Other (See Comments)  .  Varenicline Other (See Comments)    DISCHARGE MEDICATIONS:   Discharge Medication List as of 09/11/2016 10:39 AM    START taking these medications   Details  guaiFENesin-codeine 100-10 MG/5ML syrup Take 10 mLs by mouth every 4 (four) hours as needed for cough., Starting Sat 09/11/2016, Print    ipratropium-albuterol (DUONEB) 0.5-2.5 (3) MG/3ML SOLN Take 3 mLs by  nebulization every 6 (six) hours., Starting Sat 09/11/2016, Normal    levofloxacin (LEVAQUIN) 750 MG tablet Take 1 tablet (750 mg total) by mouth daily., Starting Sat 09/11/2016, Normal      CONTINUE these medications which have NOT CHANGED   Details  albuterol (PROAIR HFA) 108 (90 Base) MCG/ACT inhaler Inhale 2 puffs into the lungs every 6 (six) hours as needed for wheezing or shortness of breath., Starting Mon 08/02/2016, Normal    atorvastatin (LIPITOR) 40 MG tablet Take 1 tablet by mouth daily., Starting Fri 03/30/2013, Historical Med    diazepam (VALIUM) 5 MG tablet Take 5 mg by mouth every 6 (six) hours as needed for anxiety., Historical Med    fluticasone furoate-vilanterol (BREO ELLIPTA) 200-25 MCG/INH AEPB Inhale 1 puff into the lungs daily., Starting Mon 08/02/2016, Normal    methocarbamol (ROBAXIN) 500 MG tablet Take 500 mg by mouth every 6 (six) hours as needed for muscle spasms., Historical Med    metoprolol tartrate (LOPRESSOR) 25 MG tablet Take 25 mg by mouth 2 (two) times daily., Historical Med    nicotine (NICODERM CQ - DOSED IN MG/24 HOURS) 21 mg/24hr patch Place 1 patch on skin for 2 weeks., Normal    oxyCODONE (OXY IR/ROXICODONE) 5 MG immediate release tablet Take 5 mg by mouth every 4 (four) hours as needed (pain). , Historical Med    pantoprazole (PROTONIX) 20 MG tablet Take 20 mg by mouth daily. , Starting Fri 03/30/2013, Historical Med    testosterone enanthate (DELATESTRYL) 200 MG/ML injection Inject 1.3 mg into the muscle every 14 (fourteen) days. For IM use only, Historical Med      STOP taking these medications     hydrochlorothiazide (MICROZIDE) 12.5 MG capsule          DISCHARGE INSTRUCTIONS:    The patient is to follow-up with primary care physician as outpatient  If you experience worsening of your admission symptoms, develop shortness of breath, life threatening emergency, suicidal or homicidal thoughts you must seek medical attention immediately  by calling 911 or calling your MD immediately  if symptoms less severe.  You Must read complete instructions/literature along with all the possible adverse reactions/side effects for all the Medicines you take and that have been prescribed to you. Take any new Medicines after you have completely understood and accept all the possible adverse reactions/side effects.   Please note  You were cared for by a hospitalist during your hospital stay. If you have any questions about your discharge medications or the care you received while you were in the hospital after you are discharged, you can call the unit and asked to speak with the hospitalist on call if the hospitalist that took care of you is not available. Once you are discharged, your primary care physician will handle any further medical issues. Please note that NO REFILLS for any discharge medications will be authorized once you are discharged, as it is imperative that you return to your primary care physician (or establish a relationship with a primary care physician if you do not have one) for your aftercare needs so that they can reassess  your need for medications and monitor your lab values.    Today   CHIEF COMPLAINT:   Chief Complaint  Patient presents with  . Shortness of Breath    HISTORY OF PRESENT ILLNESS:  Paul Carpenter  is a 60 y.o. male with a known history of multiple medical problems including obstructive sleep apnea, tobacco abuse, Raynaud's disease, COPD, coronary artery disease, who presents to the hospital with complaints of cough and shortness of breath, productive yellow phlegm, fever and chills. Patient's labs revealed elevated white blood cell count of 21.4 thousand, hyponatremia with sodium 129. Cardiac enzymes were normal. Lactic acid level was normal. Chest x-ray revealed chronic bronchitic changes, left upper lobe pneumonia, CT angiogram of the chest revealed no pulmonary embolism, left upper lobe and left lower  lobe superior segment pneumonia, reactive, left hilar and mediastinal adenopathy. Patient was initiated on levofloxacin and his condition improved. His white blood cell count improved to 11.7 by the day of discharge . Sodium level has improved to 133. It was felt the patient is stable to be discharged home. Continue antibiotic therapy at home to complete course  Discussion by problem  #1. Sepsis due to left-sided pneumonia, whether cell count has improved with current antibiotic therapy, patient clinically improved. The patient's blood culture showed no growth 2 days  #2. Community-acquired pneumonia, patient is to continue antibiotic therapy with levofloxacin to complete course, he is to continue doing nebs every 4 hours and guaifenesin with codeine for cough suppression. Unfortunately, sputum cultures were not obtained, however, patient clinically has improved.  Oxygenation remained stable, O2 sats were 94-96% on room air  #3. Hyponatremia, improved with therapy, HCTZ was stopped, recheck sodium level as outpatient  #4 thrombocytopenia, seems to be stable  #5. Leukocytosis, resolving     VITAL SIGNS:  Blood pressure (!) 141/85, pulse 89, temperature 98.7 F (37.1 C), temperature source Oral, resp. rate 20, height 6' (1.829 m), weight 85.7 kg (189 lb), SpO2 94 %.  I/O:   Intake/Output Summary (Last 24 hours) at 09/11/16 1421 Last data filed at 09/11/16 1100  Gross per 24 hour  Intake              390 ml  Output             1350 ml  Net             -960 ml    PHYSICAL EXAMINATION:  GENERAL:  60 y.o.-year-old patient lying in the bed with no acute distress.  EYES: Pupils equal, round, reactive to light and accommodation. No scleral icterus. Extraocular muscles intact.  HEENT: Head atraumatic, normocephalic. Oropharynx and nasopharynx clear.  NECK:  Supple, no jugular venous distention. No thyroid enlargement, no tenderness.  LUNGS: Normal breath sounds bilaterally, no wheezing,  rales,rhonchi, but some left anterior mid lung area crepitations were noted on auscultation. No use of accessory muscles of respiration.  CARDIOVASCULAR: S1, S2 normal. No murmurs, rubs, or gallops.  ABDOMEN: Soft, non-tender, non-distended. Bowel sounds present. No organomegaly or mass.  EXTREMITIES: No pedal edema, cyanosis, or clubbing.  NEUROLOGIC: Cranial nerves II through XII are intact. Muscle strength 5/5 in all extremities. Sensation intact. Gait not checked.  PSYCHIATRIC: The patient is alert and oriented x 3.  SKIN: No obvious rash, lesion, or ulcer.   DATA REVIEW:   CBC  Recent Labs Lab 09/11/16 0430  WBC 11.7*  HGB 14.9  HCT 43.3  PLT 146*    Chemistries   Recent Labs Lab 09/11/16  0430  NA 133*  K 3.6  CL 97*  CO2 28  GLUCOSE 101*  BUN 8  CREATININE 0.62  CALCIUM 8.4*    Cardiac Enzymes  Recent Labs Lab 09/10/16 1456  TROPONINI <0.03    Microbiology Results  Results for orders placed or performed during the hospital encounter of 09/09/16  Culture, blood (routine x 2)     Status: None (Preliminary result)   Collection Time: 09/09/16 11:41 AM  Result Value Ref Range Status   Specimen Description BLOOD L AC  Final   Special Requests   Final    BOTTLES DRAWN AEROBIC AND ANAEROBIC AER 7ML ANA 3ML   Culture NO GROWTH 2 DAYS  Final   Report Status PENDING  Incomplete  Culture, blood (routine x 2)     Status: None (Preliminary result)   Collection Time: 09/09/16 11:41 AM  Result Value Ref Range Status   Specimen Description BLOOD L FA  Final   Special Requests   Final    BOTTLES DRAWN AEROBIC AND ANAEROBIC AER 5ML ANA 1ML   Culture NO GROWTH 2 DAYS  Final   Report Status PENDING  Incomplete    RADIOLOGY:  Ct Angio Chest Pe W And/or Wo Contrast  Result Date: 09/09/2016 CLINICAL DATA:  60 y.o. male History of tobacco abuse COPD, neck surgery shoulder surgery, hypertension, and a somewhat calm. Recent cancer workup which according to patient by  last bronchoscopy was thought to be negative for malignancy. Patient is thought to have had something in the left upper lobe of his lung. He is here today, because he has had a cough, chest wall pain with coughing, low-grade fever, shortness of breath, mild nasal congestion,and was diagnosed with a high white count and pneumonia by outside facility and sent here for antibiotics and admission. Patient has had these symptoms for 3 or 4 days. It hurts in his left chest when he touches it no radiation. He can exactly designate the rib area of the left rib cage that hurts. He denies any leg swelling or history of blood clots in himself or his family. EXAM: CT ANGIOGRAPHY CHEST WITH CONTRAST TECHNIQUE: Multidetector CT imaging of the chest was performed using the standard protocol during bolus administration of intravenous contrast. Multiplanar CT image reconstructions and MIPs were obtained to evaluate the vascular anatomy. CONTRAST:  150 mL of Isovue 370 intravenous contrast COMPARISON:  Multiple prior chest CTs, most recent dated 08/23/2016 FINDINGS: Cardiovascular: Despite repeating the exam, there is still somewhat suboptimal opacification of the pulmonary arteries, which limits evaluation of the smaller segmental and subsegmental arteries for pulmonary emboli. Allowing for this limitation, there is no convincing pulmonary embolus. The great vessels are normal in caliber. No aortic dissection. Mild atherosclerotic calcifications noted along the aortic arch and descending thoracic aorta. Heart is normal in size. There are dense coronary artery calcifications. Mediastinum/Nodes: There are no mediastinal masses. There are scattered prominent to borderline enlarged mediastinal lymph nodes. Largest is a 1 cm short axis left peribronchial node. There are prominent to mildly enlarged left hilar lymph nodes, largest measuring 12 mm. Lungs/Pleura: There is consolidation in the posterior aspect of the left upper lobe and  adjacent superior segment of the left lower lobe consistent with pneumonia, new since the most recent prior chest CT. Irregular focal opacity noted in both upper lobes and apices, likely scarring, is stable from the prior exam. There is a small area of reticular opacity in the anterior right middle lobe, also stable and  likely scarring. Minor dependent subsegmental atelectasis is noted in the lower lobes. Stable changes of emphysema. There is no pleural effusion. No pneumothorax. No evidence of pulmonary edema. Upper Abdomen: Several low-density liver lesions are noted, stable, consistent with cysts. No acute findings in the upper abdomen. Musculoskeletal: No chest wall abnormality. No acute or significant osseous findings. Review of the MIP images confirms the above findings. IMPRESSION: 1. Somewhat limited study as detailed above. No convincing pulmonary embolus. 2. Left upper lobe and left lower lobe superior segment pneumonia associated with reactive left hilar and mediastinal adenopathy. These findings are new since the prior CT. 3. Stable small areas of apical lung scarring. Stable changes of emphysema. Electronically Signed   By: Lajean Manes M.D.   On: 09/09/2016 14:47    EKG:   Orders placed or performed during the hospital encounter of 09/09/16  . ED EKG  . ED EKG  . EKG 12-Lead  . EKG 12-Lead  . EKG 12-Lead  . EKG 12-Lead      Management plans discussed with the patient, family and they are in agreement.  CODE STATUS:     Code Status Orders        Start     Ordered   09/09/16 1436  Full code  Continuous     09/09/16 1436    Code Status History    Date Active Date Inactive Code Status Order ID Comments User Context   This patient has a current code status but no historical code status.      TOTAL TIME TAKING CARE OF THIS PATIENT: 40  minutes.    Theodoro Grist M.D on 09/11/2016 at 2:21 PM  Between 7am to 6pm - Pager - [phone]  After 6pm go to [email or web address] -  password [email or web address] [facility]  Office  [phone]  CC: Primary care physician; Kirk Ruths., MD

## 2016-09-14 LAB — CULTURE, BLOOD (ROUTINE X 2)
CULTURE: NO GROWTH
CULTURE: NO GROWTH

## 2016-10-04 ENCOUNTER — Ambulatory Visit: Payer: BLUE CROSS/BLUE SHIELD | Admitting: Internal Medicine

## 2016-10-05 ENCOUNTER — Encounter: Payer: Self-pay | Admitting: Internal Medicine

## 2016-10-05 ENCOUNTER — Inpatient Hospital Stay: Payer: BLUE CROSS/BLUE SHIELD

## 2016-10-05 ENCOUNTER — Inpatient Hospital Stay: Payer: BLUE CROSS/BLUE SHIELD | Attending: Oncology

## 2016-10-05 ENCOUNTER — Ambulatory Visit (INDEPENDENT_AMBULATORY_CARE_PROVIDER_SITE_OTHER): Payer: BLUE CROSS/BLUE SHIELD | Admitting: Internal Medicine

## 2016-10-05 VITALS — BP 134/70 | HR 117 | Temp 100.4°F | Wt 188.0 lb

## 2016-10-05 DIAGNOSIS — R918 Other nonspecific abnormal finding of lung field: Secondary | ICD-10-CM | POA: Diagnosis not present

## 2016-10-05 DIAGNOSIS — J181 Lobar pneumonia, unspecified organism: Secondary | ICD-10-CM

## 2016-10-05 DIAGNOSIS — D751 Secondary polycythemia: Secondary | ICD-10-CM

## 2016-10-05 DIAGNOSIS — F17210 Nicotine dependence, cigarettes, uncomplicated: Secondary | ICD-10-CM

## 2016-10-05 DIAGNOSIS — J432 Centrilobular emphysema: Secondary | ICD-10-CM

## 2016-10-05 DIAGNOSIS — J189 Pneumonia, unspecified organism: Secondary | ICD-10-CM

## 2016-10-05 LAB — HEMOGLOBIN: Hemoglobin: 15.4 g/dL (ref 13.0–18.0)

## 2016-10-05 MED ORDER — AMOXICILLIN-POT CLAVULANATE 875-125 MG PO TABS: 1.0000 | ORAL_TABLET | Freq: Two times a day (BID) | ORAL | 0 refills | Status: DC

## 2016-10-05 MED ORDER — GUAIFENESIN-CODEINE 100-10 MG/5ML PO SOLN: 5.0000 mL | Freq: Two times a day (BID) | ORAL | 0 refills | Status: DC | PRN

## 2016-10-05 NOTE — Assessment & Plan Note (Signed)
Tobacco Cessation - Counseling regarding benefits of smoking cessation strategies was provided for more than 3 min. - Educated that at this time smoking- cessation represents the single most important step that patient can take to enhance the length and quality of live. - Educated patient regarding alternatives of behavior interventions, pharmacotherapy including NRT and non-nicotine therapy such, and combinations of both. - Patient at this time will try to quit, and resume the nicotine patches.

## 2016-10-05 NOTE — Patient Instructions (Addendum)
Follow up with Dr. [name] in 3 months - Augmentin '875mg'$  - 1 tab PO BID x 10 days - no further CTs at this point given the number you have had in a short period of time - please CUT back and STOP smoking. Resume nicotine patches - we will consider a repeat CT chest after you next follow up visit.  - Tussionex liquid - 1 teaspoon BID as needed for intense coughing. NO REFILLS.

## 2016-10-05 NOTE — Assessment & Plan Note (Signed)
ENB of LUL lesion negative for malignant cell in 06/2016. Repeat CT chest in 08/2016 with RUL small scarring lesion, this is new.  Had another CTA Chest 09/09/16 for severe dyspnea - no PE, but RUL PNA  At this time no further CT chest. As long as patient is smoking it will be difficult to treat his PNA, this was explained to the patient.   Plan - clinical monitoring at this time - recent CTA Chest with stable LUL lesion, and new RUL lesion (could be reactive) - as f/u with Heme/onc in Jan, will determine need for repeat CT at that point.

## 2016-10-05 NOTE — Assessment & Plan Note (Signed)
Recent admission for RUL PNA, treated with levaquin during 3 day inpt stay. Now with fevers (low grade) and increase cough.  Plan - augmentin '875mg'$  - 1 tab PO BID x 10 days - avoid smoking

## 2016-10-05 NOTE — Assessment & Plan Note (Signed)
Still smoking about 0.5ppd, this is a significant improvement since his last visit.  Cont with Breo 200/25 His last spirometry showed an FEV1 of 60% with moderate obstruction.  Plan: -Avoid tobacco as much as possible -Breo 200/25 one puff daily gargle and rinse after each use -Tobacco counseling cessation given - limit use of albuterol

## 2016-10-05 NOTE — Progress Notes (Signed)
[facility] Consultation      MRN# [id number] Paul Carpenter [birth year]   CC: Chief Complaint  Patient presents with  . Follow-up    short winded since last night: prod cough w/green mucus: feels like he has pneumonia again; chest tightness; chills      Brief History: Synopsis: Paul Carpenter first saw the [facility] pulmonary clinic in July of 2014 for COPD. Simple spirometry showed a ratio 62%, FEV1 of 2.41 L (60% predicted). He had smoked 2 packs of cigarettes daily since his teenage years and continues to smoke. He has had multiple sinus infections and 2 sinus surgeries in the past.. LUL lung lesion negative for malignancy based on ENB 06/2016.  Events since last clinic visit: Patient presents today for follow-up visit of his COPD and LUL lesion. Since his last visit he has had 2 CT and a hospital admission recently for CAP (RUL), treated with levauqin, dc'ed 2 weeks ago, but still with symtpoms. Today has productive cough, mild thick/white, low grade fever, no chills, no fatigue, admits to SOB.  Says he feels as if PNA is coming back.  Still smoking, but down from 2.5ppd to 13 cigs per day. Has nicotine patches at home and is willing to try to quit again.  Using albuterol inhaler about 2 times per week currently   Current Outpatient Prescriptions:  .  albuterol (PROAIR HFA) 108 (90 Base) MCG/ACT inhaler, Inhale 2 puffs into the lungs every 6 (six) hours as needed for wheezing or shortness of breath., Disp: 1 Inhaler, Rfl: 3 .  atorvastatin (LIPITOR) 40 MG tablet, Take 1 tablet by mouth daily., Disp: , Rfl:  .  diazepam (VALIUM) 5 MG tablet, Take 5 mg by mouth every 6 (six) hours as needed for anxiety., Disp: , Rfl:  .  fluticasone furoate-vilanterol (BREO ELLIPTA) 200-25 MCG/INH AEPB, Inhale 1 puff into the lungs daily., Disp: 60 each, Rfl: 5 .  ipratropium-albuterol (DUONEB) 0.5-2.5 (3) MG/3ML SOLN, Take 3 mLs by nebulization every 6 (six) hours.,  Disp: 360 mL, Rfl: 5 .  methocarbamol (ROBAXIN) 500 MG tablet, Take 500 mg by mouth every 6 (six) hours as needed for muscle spasms., Disp: , Rfl:  .  metoprolol tartrate (LOPRESSOR) 25 MG tablet, Take 25 mg by mouth 2 (two) times daily., Disp: , Rfl:  .  nicotine (NICODERM CQ - DOSED IN MG/24 HOURS) 21 mg/24hr patch, Place 1 patch on skin for 2 weeks., Disp: 14 patch, Rfl: 0 .  oxyCODONE (OXY IR/ROXICODONE) 5 MG immediate release tablet, Take 5 mg by mouth every 4 (four) hours as needed (pain). , Disp: , Rfl:  .  pantoprazole (PROTONIX) 20 MG tablet, Take 20 mg by mouth daily. , Disp: , Rfl:  .  testosterone enanthate (DELATESTRYL) 200 MG/ML injection, Inject 1.3 mg into the muscle every 14 (fourteen) days. For IM use only, Disp: , Rfl:  .  guaiFENesin-codeine 100-10 MG/5ML syrup, Take 5 mLs by mouth 2 (two) times daily as needed for cough., Disp: 120 mL, Rfl: 0   Review of Systems  Constitutional: Positive for fever and malaise/fatigue. Negative for chills.  Eyes: Negative for blurred vision.  Respiratory: Positive for cough, sputum production and shortness of breath. Negative for hemoptysis and wheezing.   Cardiovascular: Negative for chest pain.  Gastrointestinal: Negative for heartburn, nausea and vomiting.  Genitourinary: Negative for dysuria.  Musculoskeletal: Positive for joint pain.  Skin: Negative for rash.  Neurological: Negative for dizziness and headaches.  Endo/Heme/Allergies: Does not  bruise/bleed easily.  Psychiatric/Behavioral: Negative for depression.      Allergies:  Aspirin; Lisinopril; and Varenicline  Physical Examination:  VS: BP 134/70 (BP Location: Left Arm, Cuff Size: Normal)   Pulse (!) 117   Temp (!) 100.4 F (38 C) (Oral)   Wt 188 lb (85.3 kg)   SpO2 98%   BMI 25.50 kg/m   General Appearance: No distress  HEENT: PERRLA, no ptosis, no other lesions noticed Pulmonary:normal breath sounds., diaphragmatic excursion normal.No wheezing, No rales     Cardiovascular:  Normal S1,S2.  No m/r/g.     Abdomen:Exam: Benign, Soft, non-tender, No masses  Skin:   warm, no rashes, no ecchymosis  Extremities: normal, no cyanosis, clubbing, warm with normal capillary refill.     Imaging - (The following images and results were reviewed by Dr. Stevenson Clinch on 10/05/2016). CTA Chest 09/09/2016 FINDINGS: Cardiovascular: Despite repeating the exam, there is still somewhat suboptimal opacification of the pulmonary arteries, which limits evaluation of the smaller segmental and subsegmental arteries for pulmonary emboli. Allowing for this limitation, there is no convincing pulmonary embolus.  The great vessels are normal in caliber. No aortic dissection. Mild atherosclerotic calcifications noted along the aortic arch and descending thoracic aorta. Heart is normal in size. There are dense coronary artery calcifications.  Mediastinum/Nodes: There are no mediastinal masses. There are scattered prominent to borderline enlarged mediastinal lymph nodes. Largest is a 1 cm short axis left peribronchial node. There are prominent to mildly enlarged left hilar lymph nodes, largest measuring 12 mm.  Lungs/Pleura: There is consolidation in the posterior aspect of the left upper lobe and adjacent superior segment of the left lower lobe consistent with pneumonia, new since the most recent prior chest CT. Irregular focal opacity noted in both upper lobes and apices, likely scarring, is stable from the prior exam. There is a small area of reticular opacity in the anterior right middle lobe, also stable and likely scarring. Minor dependent subsegmental atelectasis is noted in the lower lobes. Stable changes of emphysema. There is no pleural effusion. No pneumothorax. No evidence of pulmonary edema.  Upper Abdomen: Several low-density liver lesions are noted, stable, consistent with cysts. No acute findings in the upper abdomen.  Musculoskeletal: No chest wall  abnormality. No acute or significant osseous findings.  Review of the MIP images confirms the above findings.  IMPRESSION: 1. Somewhat limited study as detailed above. No convincing pulmonary embolus. 2. Left upper lobe and left lower lobe superior segment pneumonia associated with reactive left hilar and mediastinal adenopathy. These findings are new since the prior CT. 3. Stable small areas of apical lung scarring. Stable changes of emphysema.   Assessment and Plan: 60 year old male with COPD, now with left upper lobe lung lesion, currently with negative navigational bronchoscopy and biopsy, receiving surveillance CTs for this lesion, presents today for follow-up visit and COPD optimization  COPD (chronic obstructive pulmonary disease) Still smoking about 0.5ppd, this is a significant improvement since his last visit.  Cont with Breo 200/25 His last spirometry showed an FEV1 of 60% with moderate obstruction.  Plan: -Avoid tobacco as much as possible -Breo 200/25 one puff daily gargle and rinse after each use -Tobacco counseling cessation given - limit use of albuterol   CAP (community acquired pneumonia) Recent admission for RUL PNA, treated with levaquin during 3 day inpt stay. Now with fevers (low grade) and increase cough.  Plan - augmentin '875mg'$  - 1 tab PO BID x 10 days - avoid smoking   Cigarette  smoker Tobacco Cessation - Counseling regarding benefits of smoking cessation strategies was provided for more than 3 min. - Educated that at this time smoking- cessation represents the single most important step that patient can take to enhance the length and quality of live. - Educated patient regarding alternatives of behavior interventions, pharmacotherapy including NRT and non-nicotine therapy such, and combinations of both. - Patient at this time will try to quit, and resume the nicotine patches.    Mass of upper lobe of left lung ENB of LUL lesion negative for  malignant cell in 06/2016. Repeat CT chest in 08/2016 with RUL small scarring lesion, this is new.  Had another CTA Chest 09/09/16 for severe dyspnea - no PE, but RUL PNA  At this time no further CT chest. As long as patient is smoking it will be difficult to treat his PNA, this was explained to the patient.   Plan - clinical monitoring at this time - recent CTA Chest with stable LUL lesion, and new RUL lesion (could be reactive) - as f/u with Heme/onc in Jan, will determine need for repeat CT at that point.    Updated Medication List Outpatient Encounter Prescriptions as of 10/05/2016  Medication Sig  . albuterol (PROAIR HFA) 108 (90 Base) MCG/ACT inhaler Inhale 2 puffs into the lungs every 6 (six) hours as needed for wheezing or shortness of breath.  Marland Kitchen atorvastatin (LIPITOR) 40 MG tablet Take 1 tablet by mouth daily.  . diazepam (VALIUM) 5 MG tablet Take 5 mg by mouth every 6 (six) hours as needed for anxiety.  . fluticasone furoate-vilanterol (BREO ELLIPTA) 200-25 MCG/INH AEPB Inhale 1 puff into the lungs daily.  Marland Kitchen ipratropium-albuterol (DUONEB) 0.5-2.5 (3) MG/3ML SOLN Take 3 mLs by nebulization every 6 (six) hours.  . methocarbamol (ROBAXIN) 500 MG tablet Take 500 mg by mouth every 6 (six) hours as needed for muscle spasms.  . metoprolol tartrate (LOPRESSOR) 25 MG tablet Take 25 mg by mouth 2 (two) times daily.  . nicotine (NICODERM CQ - DOSED IN MG/24 HOURS) 21 mg/24hr patch Place 1 patch on skin for 2 weeks.  Marland Kitchen oxyCODONE (OXY IR/ROXICODONE) 5 MG immediate release tablet Take 5 mg by mouth every 4 (four) hours as needed (pain).   . pantoprazole (PROTONIX) 20 MG tablet Take 20 mg by mouth daily.   Marland Kitchen testosterone enanthate (DELATESTRYL) 200 MG/ML injection Inject 1.3 mg into the muscle every 14 (fourteen) days. For IM use only  . guaiFENesin-codeine 100-10 MG/5ML syrup Take 5 mLs by mouth 2 (two) times daily as needed for cough.  . [DISCONTINUED] guaiFENesin-codeine 100-10 MG/5ML syrup  Take 10 mLs by mouth every 4 (four) hours as needed for cough.  . [DISCONTINUED] levofloxacin (LEVAQUIN) 750 MG tablet Take 1 tablet (750 mg total) by mouth daily.   No facility-administered encounter medications on file as of 10/05/2016.     Orders for this visit: No orders of the defined types were placed in this encounter.   Thank  you for the visitation and for allowing  [facility] to assist in the care of your patient. Our recommendations are noted above.  Please contact us if we can be of further service.  Vilinda Boehringer, MD [facility] Office Number: [phone]  Note: This note was prepared with Dragon dictation along with smaller phrase technology. Any transcriptional errors that result from this process are unintentional.

## 2016-10-06 ENCOUNTER — Other Ambulatory Visit: Payer: BLUE CROSS/BLUE SHIELD

## 2016-10-09 ENCOUNTER — Telehealth: Payer: Self-pay | Admitting: Pulmonary Disease

## 2016-10-09 NOTE — Telephone Encounter (Signed)
Called by Paul Carpenter's wife, who relates that Paul Carpenter was seen by Dr. Stevenson Clinch on 10/05/2016.in the clinic and is being treated for pneumonia with Auigmentin, however, he is not responding as he had hoped. She denies that he has fever or increased wheezing. C/o cough productive of "green, milky" sputum. I told her that it is very hard to make a diagnosis over the phone and if he is failing outpatient Augmentin Rx, he needs to be evaluated in the Emergency Department for possible admission to the hospital and IV antibiotics given his underlying lung function and recent history of pneumonia.. She states the he is reluctant to go to the hospital. In the event that he refuses to go to the hospital, he should call the PCCM office on Monday to be seen ASAP/

## 2016-10-11 NOTE — Telephone Encounter (Signed)
Called and spoke to pt to see how pt was doing. Pt states he feels he is improving. Offered pt an appt in [place], pt refused and states he will call back if he does not continue to improve or worsens. Nothing further needed at this time.

## 2016-11-19 ENCOUNTER — Other Ambulatory Visit: Payer: Self-pay | Admitting: *Deleted

## 2016-11-19 DIAGNOSIS — D751 Secondary polycythemia: Secondary | ICD-10-CM

## 2016-11-24 NOTE — Progress Notes (Signed)
[facility]  [phone] [phone]  ID: Paul Carpenter OB: Oct 21, 1956  MR#: [id number]  PTW#:[id number]  Patient Care Team: Kirk Ruths, MD as PCP - General (Internal Medicine)  CHIEF COMPLAINT: Secondary polycythemia  INTERVAL HISTORY: Patient returns to clinic today for further evaluation, discussion of his imaging results, and consideration of phlebotomy. He continues to be anxious, but otherwise feels well. He was admitted to the hospital recently for pneumonia. He has recovered and now is back to his baseline. He has no neurologic complaints. He denies any recent fevers or illnesses. He has no chest pain, cough, hemoptysis, or shortness of breath. He denies any nausea, vomiting, constipation, or diarrhea. He has no urinary complaints. Patient offers no further specific complaints today.   REVIEW OF SYSTEMS:   Review of Systems  Constitutional: Negative.  Negative for fever, malaise/fatigue and weight loss.  Respiratory: Negative.  Negative for cough and shortness of breath.   Cardiovascular: Negative.  Negative for chest pain.  Gastrointestinal: Negative.  Negative for abdominal pain, blood in stool and melena.  Genitourinary: Negative.   Musculoskeletal: Positive for joint pain.  Neurological: Negative.  Negative for sensory change and weakness.  Psychiatric/Behavioral: The patient is nervous/anxious.     As per HPI. Otherwise, a complete review of systems is negative.  PAST MEDICAL HISTORY: Past Medical History:  Diagnosis Date  . Anginal pain ([place])   . Anxiety   . Chicken pox   . Complication of anesthesia    o2 dropped after neck fusion  . COPD (chronic obstructive pulmonary disease) ([place])   . Coronary artery disease   . Cough    chronic  clear phlegm  . Dysrhythmia   . GERD (gastroesophageal reflux disease)    h/o reflux/ hoarsness  . Hematochezia   . Hemorrhoids   . Hoarseness   . Hypertension   . Migraines     . OSA (obstructive sleep apnea)    has CPAP but does not use  . Personal history of tobacco use, presenting hazards to health 03/05/2016  . Pneumonia    5/17  . Raynaud disease   . Raynaud's disease   . Rotator cuff tear    on right  . Shortness of breath dyspnea   . Sleep apnea   . Ulcer (traumatic) of oral mucosa     PAST SURGICAL HISTORY: Past Surgical History:  Procedure Laterality Date  . BACK SURGERY     cervical fusion x 2  . CARDIAC CATHETERIZATION    . CERVICAL DISCECTOMY    . COLONOSCOPY    . COLONOSCOPY N/A 07/25/2015   Procedure: COLONOSCOPY;  Surgeon: Lollie Sails, MD;  Location: [facility] ENDOSCOPY;  Service: Endoscopy;  Laterality: N/A;  . ELECTROMAGNETIC NAVIGATION BROCHOSCOPY Left 06/28/2016   Procedure: ELECTROMAGNETIC NAVIGATION BRONCHOSCOPY;  Surgeon: Vilinda Boehringer, MD;  Location: [facility] ORS;  Service: Cardiopulmonary;  Laterality: Left;  . ESOPHAGOGASTRODUODENOSCOPY N/A 07/25/2015   Procedure: ESOPHAGOGASTRODUODENOSCOPY (EGD);  Surgeon: Lollie Sails, MD;  Location: [facility] ENDOSCOPY;  Service: Endoscopy;  Laterality: N/A;  . NASAL SINUS SURGERY     x 2   . NECK SURGERY    . SEPTOPLASTY    . SKIN GRAFT      FAMILY HISTORY Family History  Problem Relation Age of Onset  . Heart disease Father   . Prostate cancer Father   . Heart disease Paternal Grandmother   . Heart attack Maternal Grandfather 52       ADVANCED DIRECTIVES:  HEALTH MAINTENANCE: Social History  Substance Use Topics  . Smoking status: Current Every Day Smoker    Packs/day: 2.00    Years: 45.00    Types: Cigarettes  . Smokeless tobacco: Never Used  . Alcohol use 1.2 oz/week    2 Standard drinks or equivalent per week     Allergies  Allergen Reactions  . Aspirin Other (See Comments)  . Lisinopril Other (See Comments)  . Varenicline Other (See Comments)    Current Outpatient Prescriptions  Medication Sig Dispense Refill  . albuterol (PROAIR HFA) 108 (90 Base) MCG/ACT  inhaler Inhale 2 puffs into the lungs every 6 (six) hours as needed for wheezing or shortness of breath. 1 Inhaler 3  . aspirin EC 81 MG tablet Take 81 mg by mouth.    Marland Kitchen atorvastatin (LIPITOR) 40 MG tablet Take 1 tablet by mouth daily.    . diazepam (VALIUM) 5 MG tablet Take 5 mg by mouth every 6 (six) hours as needed for anxiety.    . fluticasone furoate-vilanterol (BREO ELLIPTA) 200-25 MCG/INH AEPB Inhale 1 puff into the lungs daily. 60 each 5  . ipratropium-albuterol (DUONEB) 0.5-2.5 (3) MG/3ML SOLN Take 3 mLs by nebulization every 6 (six) hours. 360 mL 5  . metoprolol tartrate (LOPRESSOR) 25 MG tablet Take 25 mg by mouth 2 (two) times daily.    . Multiple Vitamins-Minerals (MULTIVITAMIN ADULT PO) Take by mouth.    . nicotine (NICODERM CQ - DOSED IN MG/24 HOURS) 21 mg/24hr patch Place 1 patch on skin for 2 weeks. 14 patch 0  . pantoprazole (PROTONIX) 40 MG tablet Take 40 mg by mouth daily.  3  . potassium chloride (KLOR-CON) 20 MEQ packet Take 20 mEq by mouth daily.     Marland Kitchen testosterone enanthate (DELATESTRYL) 200 MG/ML injection Inject 1.3 mg into the muscle every 14 (fourteen) days. For IM use only     No current facility-administered medications for this visit.     OBJECTIVE: Vitals:   11/25/16 1435  BP: (!) 146/85  Pulse: 92  Resp: 18  Temp: 98.3 F (36.8 C)     Body mass index is 26.64 kg/m.    ECOG FS:0 - Asymptomatic  General: Well-developed, well-nourished, no acute distress. Eyes: anicteric sclera. Lungs: Clear to auscultation bilaterally. Heart: Regular rate and rhythm. No rubs, murmurs, or gallops. Abdomen: Soft, nontender, nondistended. N Musculoskeletal: No edema, cyanosis, or clubbing. Neuro: Alert, answering all questions appropriately.  Skin: No rashes or petechiae noted. Psych: Appears anxious   LAB RESULTS:  Lab Results  Component Value Date   NA 133 (L) 09/11/2016   K 3.6 09/11/2016   CL 97 (L) 09/11/2016   CO2 28 09/11/2016   GLUCOSE 101 (H)  09/11/2016   BUN 8 09/11/2016   CREATININE 0.62 09/11/2016   CALCIUM 8.4 (L) 09/11/2016   PROT 6.9 06/01/2016   ALBUMIN 4.2 06/01/2016   AST 24 06/01/2016   ALT 26 06/01/2016   ALKPHOS 74 06/01/2016   BILITOT 0.8 06/01/2016   GFRNONAA >60 09/11/2016   GFRAA >60 09/11/2016    Lab Results  Component Value Date   WBC 11.3 (H) 11/25/2016   NEUTROABS 8.9 (H) 11/25/2016   HGB 15.4 11/25/2016   HCT 44.8 11/25/2016   MCV 94.2 11/25/2016   PLT 273 11/25/2016     STUDIES:  ASSESSMENT: Secondary polycythemia.  PLAN:    1.  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  Previously, the remainder of his laboratory work, including JAK-2 mutation, was  either negative or within normal limits. Patient's hemoglobin is decreased and 15.4. He does not require phlebotomy today.  Goal hemoglobin will be approximately 17.0. Return to clinic in 3 months for further evaluation.  2. PET positive left upper lobe pulmonary nodule: Biopsy was negative for malignant cells. Given the mild hypermetabolism and patient's heavy tobacco history, this is still suspicious for underlying malignancy. CT scan on August 23, 2016 revealed no interval change. Repeat CT scan in a month's prior to next clinic visit.  3.  Colon polyps:  Patient has a personal history of greater then 10 adenomatous polyps on his most recent conoloscopy. He does not know of any family history of increased polyps or colon cancer.  Genetic testing to assess for the APC mutation for FAP or AFAP was negative. Continue colonoscopies as per GI. 4. Tobacco Use: Patient continues to heavily smoke, but has decreased significantly since his hospitalization for pneumonia.  5. Anxiety: Treatment as per pcp.   Patient expressed understanding and was in agreement with this plan. He also understands that He can call clinic at any time with any questions, concerns, or complaints.    [name], MD   11/30/2016 11:15 AM

## 2016-11-25 ENCOUNTER — Inpatient Hospital Stay: Payer: BLUE CROSS/BLUE SHIELD | Attending: Oncology

## 2016-11-25 ENCOUNTER — Inpatient Hospital Stay (HOSPITAL_BASED_OUTPATIENT_CLINIC_OR_DEPARTMENT_OTHER): Payer: BLUE CROSS/BLUE SHIELD | Admitting: Oncology

## 2016-11-25 ENCOUNTER — Inpatient Hospital Stay: Payer: BLUE CROSS/BLUE SHIELD

## 2016-11-25 VITALS — BP 146/85 | HR 92 | Temp 98.3°F | Resp 18 | Wt 196.4 lb

## 2016-11-25 DIAGNOSIS — Z8701 Personal history of pneumonia (recurrent): Secondary | ICD-10-CM | POA: Insufficient documentation

## 2016-11-25 DIAGNOSIS — I10 Essential (primary) hypertension: Secondary | ICD-10-CM | POA: Diagnosis not present

## 2016-11-25 DIAGNOSIS — I7300 Raynaud's syndrome without gangrene: Secondary | ICD-10-CM | POA: Insufficient documentation

## 2016-11-25 DIAGNOSIS — R918 Other nonspecific abnormal finding of lung field: Secondary | ICD-10-CM

## 2016-11-25 DIAGNOSIS — Z8042 Family history of malignant neoplasm of prostate: Secondary | ICD-10-CM

## 2016-11-25 DIAGNOSIS — Z8669 Personal history of other diseases of the nervous system and sense organs: Secondary | ICD-10-CM | POA: Insufficient documentation

## 2016-11-25 DIAGNOSIS — Z7982 Long term (current) use of aspirin: Secondary | ICD-10-CM

## 2016-11-25 DIAGNOSIS — K219 Gastro-esophageal reflux disease without esophagitis: Secondary | ICD-10-CM | POA: Insufficient documentation

## 2016-11-25 DIAGNOSIS — J449 Chronic obstructive pulmonary disease, unspecified: Secondary | ICD-10-CM | POA: Diagnosis not present

## 2016-11-25 DIAGNOSIS — I2510 Atherosclerotic heart disease of native coronary artery without angina pectoris: Secondary | ICD-10-CM | POA: Diagnosis not present

## 2016-11-25 DIAGNOSIS — F419 Anxiety disorder, unspecified: Secondary | ICD-10-CM | POA: Insufficient documentation

## 2016-11-25 DIAGNOSIS — I499 Cardiac arrhythmia, unspecified: Secondary | ICD-10-CM | POA: Insufficient documentation

## 2016-11-25 DIAGNOSIS — D751 Secondary polycythemia: Secondary | ICD-10-CM

## 2016-11-25 DIAGNOSIS — M2550 Pain in unspecified joint: Secondary | ICD-10-CM

## 2016-11-25 DIAGNOSIS — Z86010 Personal history of colonic polyps: Secondary | ICD-10-CM

## 2016-11-25 DIAGNOSIS — Z79899 Other long term (current) drug therapy: Secondary | ICD-10-CM | POA: Insufficient documentation

## 2016-11-25 DIAGNOSIS — G4730 Sleep apnea, unspecified: Secondary | ICD-10-CM

## 2016-11-25 DIAGNOSIS — F17210 Nicotine dependence, cigarettes, uncomplicated: Secondary | ICD-10-CM | POA: Diagnosis not present

## 2016-11-25 LAB — CBC WITH DIFFERENTIAL/PLATELET
Basophils Absolute: 0.1 10*3/uL (ref 0–0.1)
Basophils Relative: 1 %
EOS PCT: 1 %
Eosinophils Absolute: 0.1 10*3/uL (ref 0–0.7)
HCT: 44.8 % (ref 40.0–52.0)
HEMOGLOBIN: 15.4 g/dL (ref 13.0–18.0)
LYMPHS ABS: 1.6 10*3/uL (ref 1.0–3.6)
LYMPHS PCT: 14 %
MCH: 32.4 pg (ref 26.0–34.0)
MCHC: 34.4 g/dL (ref 32.0–36.0)
MCV: 94.2 fL (ref 80.0–100.0)
MONOS PCT: 5 %
Monocytes Absolute: 0.6 10*3/uL (ref 0.2–1.0)
NEUTROS PCT: 79 %
Neutro Abs: 8.9 10*3/uL — ABNORMAL HIGH (ref 1.4–6.5)
Platelets: 273 10*3/uL (ref 150–440)
RBC: 4.75 MIL/uL (ref 4.40–5.90)
RDW: 15.6 % — ABNORMAL HIGH (ref 11.5–14.5)
WBC: 11.3 10*3/uL — AB (ref 3.8–10.6)

## 2016-11-25 NOTE — Progress Notes (Signed)
Patient is here for follow up, he is doing well. He has mentioned some SOB

## 2017-01-20 ENCOUNTER — Ambulatory Visit (INDEPENDENT_AMBULATORY_CARE_PROVIDER_SITE_OTHER): Payer: BLUE CROSS/BLUE SHIELD | Admitting: Internal Medicine

## 2017-01-20 ENCOUNTER — Encounter: Payer: Self-pay | Admitting: Internal Medicine

## 2017-01-20 VITALS — BP 138/90 | HR 87 | Wt 199.0 lb

## 2017-01-20 DIAGNOSIS — J449 Chronic obstructive pulmonary disease, unspecified: Secondary | ICD-10-CM | POA: Diagnosis not present

## 2017-01-20 MED ORDER — TIOTROPIUM BROMIDE MONOHYDRATE 2.5 MCG/ACT IN AERS: 2.0000 | INHALATION_SPRAY | Freq: Every day | RESPIRATORY_TRACT | 5 refills | Status: DC

## 2017-01-20 MED ORDER — FLUTICASONE FUROATE-VILANTEROL 200-25 MCG/INH IN AEPB: 1.0000 | INHALATION_SPRAY | Freq: Every day | RESPIRATORY_TRACT | 5 refills | Status: DC

## 2017-01-20 NOTE — Patient Instructions (Addendum)
CT chest with Contrast pending Start Spiriva  continue Breo 200 Albuterol as needed Check PFT Check ONO Check 6MWT

## 2017-01-20 NOTE — Progress Notes (Signed)
[facility] Consultation      MRN# [id number] Paul Carpenter [birth year]   CC: Follow up COPD and wheezing   Brief History: Synopsis: Paul Carpenter first saw the [facility] pulmonary clinic in July of 2014 for COPD. Simple spirometry showed a ratio 62%, FEV1 of 2.41 L (60% predicted). He had smoked 2 packs of cigarettes daily since his teenage years and continues to smoke. He has had multiple sinus infections and 2 sinus surgeries in the past.. LUL lung lesion negative for malignancy based on ENB 06/2016.  HPI Patient presents today for follow-up visit of his COPD Patient still wheezing,increased WOB No signs of infection at this time Admitted on 09/2016 for pneumonia and COPD exacerbation Still smoking    Current Outpatient Prescriptions:  .  albuterol (PROAIR HFA) 108 (90 Base) MCG/ACT inhaler, Inhale 2 puffs into the lungs every 6 (six) hours as needed for wheezing or shortness of breath., Disp: 1 Inhaler, Rfl: 3 .  aspirin EC 81 MG tablet, Take 81 mg by mouth., Disp: , Rfl:  .  atorvastatin (LIPITOR) 40 MG tablet, Take 1 tablet by mouth daily., Disp: , Rfl:  .  diazepam (VALIUM) 5 MG tablet, Take 5 mg by mouth every 6 (six) hours as needed for anxiety., Disp: , Rfl:  .  fluticasone furoate-vilanterol (BREO ELLIPTA) 200-25 MCG/INH AEPB, Inhale 1 puff into the lungs daily., Disp: 60 each, Rfl: 5 .  ipratropium-albuterol (DUONEB) 0.5-2.5 (3) MG/3ML SOLN, Take 3 mLs by nebulization every 6 (six) hours., Disp: 360 mL, Rfl: 5 .  metoprolol tartrate (LOPRESSOR) 25 MG tablet, Take 25 mg by mouth 2 (two) times daily., Disp: , Rfl:  .  Multiple Vitamins-Minerals (MULTIVITAMIN ADULT PO), Take by mouth., Disp: , Rfl:  .  nicotine (NICODERM CQ - DOSED IN MG/24 HOURS) 21 mg/24hr patch, Place 1 patch on skin for 2 weeks., Disp: 14 patch, Rfl: 0 .  pantoprazole (PROTONIX) 40 MG tablet, Take 40 mg by mouth daily., Disp: , Rfl: 3 .  potassium chloride (KLOR-CON) 20 MEQ  packet, Take 20 mEq by mouth daily. , Disp: , Rfl:  .  testosterone enanthate (DELATESTRYL) 200 MG/ML injection, Inject 1.3 mg into the muscle every 14 (fourteen) days. For IM use only, Disp: , Rfl:    Review of Systems  Constitutional: Positive for malaise/fatigue. Negative for chills and fever.  Eyes: Negative for blurred vision.  Respiratory: Positive for cough, sputum production and shortness of breath. Negative for hemoptysis and wheezing.   Cardiovascular: Negative for chest pain.  Gastrointestinal: Negative for heartburn, nausea and vomiting.  Genitourinary: Negative for dysuria.  Musculoskeletal: Negative for joint pain.  Skin: Negative for rash.  Neurological: Negative for dizziness and headaches.  Endo/Heme/Allergies: Does not bruise/bleed easily.      Allergies:  Aspirin; Lisinopril; and Varenicline  Physical Examination:  BP 138/90 (BP Location: Left Arm, Cuff Size: Normal)   Pulse 87   Wt 199 lb (90.3 kg)   SpO2 98%   BMI 26.99 kg/m   General Appearance: No distress  HEENT: PERRLA, no ptosis, no other lesions noticed Pulmonary:normal breath sounds., diaphragmatic excursion normal.+wheezing, No rales   Cardiovascular:  Normal S1,S2.  No m/r/g.     Abdomen:Exam: Benign, Soft, non-tender, No masses  Skin:   warm, no rashes, no ecchymosis  Extremities: normal, no cyanosis, clubbing, warm with normal capillary refill.     I Assessment and Plan: 61 year old male with Moderate COPD Gold stage B h/o left upper lobe lung lesion  COPD (chronic obstructive pulmonary disease) Still smoking about 0.5ppd, this is a significant improvement since his last visit.  Cont with Breo 200/25 Start Spiriva Respimat 2.5 His last spirometry showed an FEV1 of 60% with moderate obstruction. Will need repeat PFT's and 6MWT and ONO   Cigarette smoker Tobacco Cessation - Counseling regarding benefits of smoking cessation strategies was provided for more than 3 min. - Educated  that at this time smoking- cessation represents the single most important step that patient can take to enhance the length and quality of live. - Patient at this time will try to quit, and resume the nicotine patches.    Mass of upper lobe of left lung ENB of LUL lesion negative for malignant cell in 06/2016. Repeat CT chest in 08/2016 with RUL small scarring lesion, this is new.  Had another CTA Chest 09/09/16 for severe dyspnea - no PE, but LUL PNA  Plan - clinical monitoring at this time - recent CTA Chest with stable LUL lesion, and new RUL lesion (could be reactive) -repeat CT chest in April  Will follow up after tests completed   Patient satisfied with Plan of action and management. All questions answered  Corrin Parker, M.D.  [facility]  Medical Director [place] Director [facility] Cardio-Pulmonary Department

## 2017-01-24 ENCOUNTER — Encounter: Payer: Self-pay | Admitting: Internal Medicine

## 2017-01-24 DIAGNOSIS — J449 Chronic obstructive pulmonary disease, unspecified: Secondary | ICD-10-CM

## 2017-01-31 ENCOUNTER — Telehealth: Payer: Self-pay | Admitting: *Deleted

## 2017-01-31 DIAGNOSIS — J449 Chronic obstructive pulmonary disease, unspecified: Secondary | ICD-10-CM

## 2017-01-31 NOTE — Telephone Encounter (Signed)
Pt informed he needs O2 at 2L for night time use. Pt states he will think about it and let me know on 02/02/17 when he comes in for his SMW.

## 2017-02-02 ENCOUNTER — Ambulatory Visit (INDEPENDENT_AMBULATORY_CARE_PROVIDER_SITE_OTHER): Payer: BLUE CROSS/BLUE SHIELD | Admitting: *Deleted

## 2017-02-02 DIAGNOSIS — J432 Centrilobular emphysema: Secondary | ICD-10-CM | POA: Diagnosis not present

## 2017-02-02 NOTE — Telephone Encounter (Signed)
Spoke with pt and his wife today when he came in for SMW test. Pt has agreed to have the O2 setup for night time use. Order placed. Nothing further needed.

## 2017-02-02 NOTE — Addendum Note (Signed)
Addended by: Oscar La R on: 02/02/2017 03:09 PM   Modules accepted: Orders

## 2017-02-02 NOTE — Progress Notes (Signed)
SMW performed today. 

## 2017-02-04 ENCOUNTER — Emergency Department: Payer: BLUE CROSS/BLUE SHIELD

## 2017-02-04 ENCOUNTER — Encounter: Payer: Self-pay | Admitting: Emergency Medicine

## 2017-02-04 ENCOUNTER — Emergency Department
Admission: EM | Admit: 2017-02-04 | Discharge: 2017-02-04 | Disposition: A | Discharge status: Home / Self Care | Payer: BLUE CROSS/BLUE SHIELD | Attending: Emergency Medicine | Admitting: Emergency Medicine

## 2017-02-04 DIAGNOSIS — I2510 Atherosclerotic heart disease of native coronary artery without angina pectoris: Secondary | ICD-10-CM | POA: Diagnosis not present

## 2017-02-04 DIAGNOSIS — M5106 Intervertebral disc disorders with myelopathy, lumbar region: Secondary | ICD-10-CM | POA: Diagnosis not present

## 2017-02-04 DIAGNOSIS — Z7982 Long term (current) use of aspirin: Secondary | ICD-10-CM | POA: Diagnosis not present

## 2017-02-04 DIAGNOSIS — M545 Low back pain, unspecified: Secondary | ICD-10-CM

## 2017-02-04 DIAGNOSIS — F17210 Nicotine dependence, cigarettes, uncomplicated: Secondary | ICD-10-CM | POA: Diagnosis not present

## 2017-02-04 DIAGNOSIS — K5730 Diverticulosis of large intestine without perforation or abscess without bleeding: Secondary | ICD-10-CM | POA: Diagnosis not present

## 2017-02-04 DIAGNOSIS — Z79899 Other long term (current) drug therapy: Secondary | ICD-10-CM | POA: Diagnosis not present

## 2017-02-04 DIAGNOSIS — J449 Chronic obstructive pulmonary disease, unspecified: Secondary | ICD-10-CM | POA: Diagnosis not present

## 2017-02-04 DIAGNOSIS — M5126 Other intervertebral disc displacement, lumbar region: Secondary | ICD-10-CM

## 2017-02-04 LAB — BASIC METABOLIC PANEL
Anion gap: 8 (ref 5–15)
BUN: 15 mg/dL (ref 6–20)
CALCIUM: 9.1 mg/dL (ref 8.9–10.3)
CO2: 26 mmol/L (ref 22–32)
CREATININE: 0.86 mg/dL (ref 0.61–1.24)
Chloride: 98 mmol/L — ABNORMAL LOW (ref 101–111)
GFR calc non Af Amer: >60 mL/min (ref >60)
GLUCOSE: 96 mg/dL (ref 65–99)
Potassium: 4.3 mmol/L (ref 3.5–5.1)
Sodium: 132 mmol/L — ABNORMAL LOW (ref 135–145)

## 2017-02-04 LAB — CBC
HEMATOCRIT: 52.3 % — AB (ref 40.0–52.0)
Hemoglobin: 18 g/dL (ref 13.0–18.0)
MCH: 32.2 pg (ref 26.0–34.0)
MCHC: 34.5 g/dL (ref 32.0–36.0)
MCV: 93.2 fL (ref 80.0–100.0)
Platelets: 226 10*3/uL (ref 150–440)
RBC: 5.61 MIL/uL (ref 4.40–5.90)
RDW: 12.9 % (ref 11.5–14.5)
WBC: 11.2 10*3/uL — ABNORMAL HIGH (ref 3.8–10.6)

## 2017-02-04 LAB — TROPONIN I: Troponin I: <0.03 ng/mL (ref <0.03)

## 2017-02-04 MED ORDER — OXYCODONE HCL 5 MG PO TABS: 5.0000 mg | ORAL_TABLET | Freq: Four times a day (QID) | ORAL | 0 refills | Status: DC | PRN

## 2017-02-04 MED ORDER — MORPHINE SULFATE (PF) 4 MG/ML IV SOLN
6.0000 mg | Freq: Once | INTRAVENOUS | Status: AC
  Administered 2017-02-04: 6 mg via INTRAVENOUS
  Filled 2017-02-04: qty 2

## 2017-02-04 MED ORDER — HYDROMORPHONE HCL 1 MG/ML IJ SOLN
1.0000 mg | INTRAMUSCULAR | Status: AC
  Administered 2017-02-04: 1 mg via INTRAVENOUS
  Filled 2017-02-04: qty 1

## 2017-02-04 MED ORDER — GABAPENTIN 300 MG PO CAPS
300.0000 mg | ORAL_CAPSULE | Freq: Once | ORAL | Status: AC
  Administered 2017-02-04: 300 mg via ORAL
  Filled 2017-02-04: qty 1

## 2017-02-04 MED ORDER — MORPHINE SULFATE (PF) 4 MG/ML IV SOLN
4.0000 mg | Freq: Once | INTRAVENOUS | Status: AC
  Administered 2017-02-04: 4 mg via INTRAVENOUS

## 2017-02-04 MED ORDER — MORPHINE SULFATE (PF) 4 MG/ML IV SOLN
INTRAVENOUS | Status: AC
  Administered 2017-02-04: 4 mg via INTRAVENOUS
  Filled 2017-02-04: qty 1

## 2017-02-04 MED ORDER — METHYLPREDNISOLONE SODIUM SUCC 125 MG IJ SOLR
125.0000 mg | INTRAMUSCULAR | Status: AC
  Administered 2017-02-04: 125 mg via INTRAVENOUS
  Filled 2017-02-04: qty 2

## 2017-02-04 MED ORDER — GABAPENTIN 300 MG PO CAPS: 300.0000 mg | ORAL_CAPSULE | Freq: Every day | ORAL | 0 refills | Status: DC

## 2017-02-04 MED ORDER — PREDNISONE 20 MG PO TABS: 40.0000 mg | ORAL_TABLET | Freq: Every day | ORAL | 0 refills | Status: DC

## 2017-02-04 MED ORDER — IOPAMIDOL (ISOVUE-370) INJECTION 76%
100.0000 mL | Freq: Once | INTRAVENOUS | Status: AC | PRN
  Administered 2017-02-04: 100 mL via INTRAVENOUS

## 2017-02-04 MED ORDER — HYDROMORPHONE HCL 1 MG/ML IJ SOLN
0.5000 mg | INTRAMUSCULAR | Status: AC
  Administered 2017-02-04: 0.5 mg via INTRAVENOUS
  Filled 2017-02-04: qty 1

## 2017-02-04 MED ORDER — ONDANSETRON HCL 4 MG/2ML IJ SOLN
4.0000 mg | Freq: Once | INTRAMUSCULAR | Status: AC
  Administered 2017-02-04: 4 mg via INTRAVENOUS
  Filled 2017-02-04: qty 2

## 2017-02-04 NOTE — ED Triage Notes (Signed)
Pt comes in the ED via EMS from home c/o right hip, lower back and leg pain.  Patient states it started this morning and the pain radiates into the leg.  States there is some tingling noted to the leg.  Patients gait has a shuffle to it according to EMS and this is a new onset.  Patient in NAD at this time with even and unlabored respirations.  15 toradol given in route with EMS.  All VS stable, patient does have HTN.

## 2017-02-04 NOTE — Discharge Instructions (Signed)
You have been seen in the Emergency Department (ED)  today for back pain.    Please take Motrin (ibuprofen) as needed for your pain according to the instructions written on the box.  Alternatively, for the next five days you can take '600mg'$  three times daily with meals (it may upset your stomach).  Take oxycodone as prescribed for severe pain. Do not drink alcohol, drive or participate in any other potentially dangerous activities while taking this medication as it may make you sleepy. Do not take this medication with any other sedating medications, either prescription or over-the-counter. If you were prescribed Percocet or Vicodin, do not take these with acetaminophen (Tylenol) as it is already contained within these medications.   This medication is an opiate (or narcotic) pain medication and can be habit forming.  Use it as little as possible to achieve adequate pain control.  Do not use or use it with extreme caution if you have a history of opiate abuse or dependence.  If you are on a pain contract with your primary care doctor or a pain specialist, be sure to let them know you were prescribed this medication today from the [facility] Emergency Department.  This medication is intended for your use only - do not give any to anyone else and keep it in a secure place where nobody else, especially children, have access to it.  It will also cause or worsen constipation, so you may want to consider taking an over-the-counter stool softener while you are taking this medication.  Please follow up with Dr. Aris Lot of neurosurgery as soon as possible regarding today's ED visit and your back pain.  Return to the ED for worsening back pain, fever, worsening weakness or numbness of either leg, any foot drop, or if you develop either (1) an inability to urinate or have bowel movements, or (2) loss of your ability to control your bathroom functions (if you start having "accidents"), or if you develop other new  symptoms that concern you.

## 2017-02-04 NOTE — ED Notes (Signed)
ED Provider at bedside. 

## 2017-02-04 NOTE — ED Notes (Signed)
Answered call light. Pt requested pain meds before xray. RN notified.

## 2017-02-04 NOTE — ED Provider Notes (Signed)
[facility] Emergency Department Provider Note ____________________________________________   First MD Initiated Contact with Patient 02/04/17 1242     (approximate)  I have reviewed the triage vital signs and the nursing notes.  HISTORY  Chief Complaint Hip Pain and Back Pain  HPI Paul Carpenter is a 61 y.o. male for 2 history of coronary disease, COPD, high blood pressure, and still smokes.  Patient presents reporting severe pain in his right lower back and thigh.  Last 2 days, patient was doing work, lifting things, building flower boxes and he knows he is having aching discomfort the last 2 days lower back. 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 Reports some feeling of weakness in the toes of his right leg, also reports he's having numbness in the right foot. Reports a severe sharp pain in his right lower pelvis that runs down into the leg. Denies a cold or blue foot. No pain on the left side. No chest pain or trouble breathing. Some slight abdominal discomfort in the lower abdomen also noted  Denies any trouble urinating. No loss of bowel or bladder.  Past Medical History:  Diagnosis Date  . Anginal pain ([place])   . Anxiety   . Chicken pox   . Complication of anesthesia    o2 dropped after neck fusion  . COPD (chronic obstructive pulmonary disease) ([place])   . Coronary artery disease   . Cough    chronic  clear phlegm  . Dysrhythmia   . GERD (gastroesophageal reflux disease)    h/o reflux/ hoarsness  . Hematochezia   . Hemorrhoids   . Hoarseness   . Hypertension   . Migraines   . OSA (obstructive sleep apnea)    has CPAP but does not use  . Personal history of tobacco use, presenting hazards to health 03/05/2016  . Pneumonia    5/17  . Raynaud disease   . Raynaud's disease   . Rotator  cuff tear    on right  . Shortness of breath dyspnea   . Sleep apnea   . Ulcer (traumatic) of oral mucosa     Patient Active Problem List   Diagnosis Date Noted  . CAP (community acquired pneumonia) 09/11/2016  . COPD exacerbation ([place]) 09/11/2016  . Hyponatremia 09/11/2016  . Leukocytosis 09/11/2016  . Thrombocytopenia ([place]) 09/11/2016  . Sepsis ([place]) 09/09/2016  . Cigarette smoker 06/11/2016  . Personal history of tobacco use, presenting hazards to health 03/05/2016  . Polycythemia, secondary 03/24/2015  . Mass of upper lobe of left lung 12/06/2013  . COPD (chronic obstructive pulmonary disease) ([place]) 04/24/2013  . Tobacco abuse 04/24/2013  . Obstructive sleep apnea 04/24/2013    Past Surgical History:  Procedure Laterality Date  . BACK SURGERY     cervical fusion x 2  . CARDIAC CATHETERIZATION    . CERVICAL DISCECTOMY    . COLONOSCOPY    . COLONOSCOPY N/A 07/25/2015   Procedure: COLONOSCOPY;  Surgeon: Lollie Sails, MD;  Location: [facility] ENDOSCOPY;  Service: Endoscopy;  Laterality: N/A;  . ELECTROMAGNETIC NAVIGATION BROCHOSCOPY Left 06/28/2016   Procedure: ELECTROMAGNETIC NAVIGATION BRONCHOSCOPY;  Surgeon: Vilinda Boehringer, MD;  Location: [facility] ORS;  Service: Cardiopulmonary;  Laterality: Left;  . ESOPHAGOGASTRODUODENOSCOPY N/A 07/25/2015   Procedure: ESOPHAGOGASTRODUODENOSCOPY (EGD);  Surgeon: Lollie Sails, MD;  Location: [facility] ENDOSCOPY;  Service: Endoscopy;  Laterality: N/A;  . NASAL SINUS SURGERY     x 2   . NECK SURGERY    . SEPTOPLASTY    . SKIN GRAFT      Prior to Admission medications   Medication Sig Start Date End Date Taking? Authorizing Provider  albuterol (PROAIR HFA) 108 (90 Base) MCG/ACT inhaler Inhale 2 puffs into the lungs every 6 (six) hours as needed for wheezing or shortness of breath. 08/02/16  Yes Vishal Mungal, MD  aspirin EC 81 MG tablet Take 81 mg by mouth daily.    Yes Historical Provider, MD  atorvastatin (LIPITOR) 40 MG tablet Take 1 tablet  by mouth daily. 03/30/13  Yes Historical Provider, MD  diazepam (VALIUM) 5 MG tablet Take 5 mg by mouth at bedtime as needed for anxiety.    Yes Historical Provider, MD  fluticasone (FLONASE) 50 MCG/ACT nasal spray Place 1 spray into both nostrils daily.   Yes Historical Provider, MD  fluticasone furoate-vilanterol (BREO ELLIPTA) 200-25 MCG/INH AEPB Inhale 1 puff into the lungs daily. 01/20/17  Yes [name], MD  ipratropium-albuterol (DUONEB) 0.5-2.5 (3) MG/3ML SOLN Take 3 mLs by nebulization every 6 (six) hours. 09/11/16  Yes Theodoro Grist, MD  metoprolol tartrate (LOPRESSOR) 25 MG tablet Take 25 mg by mouth 2 (two) times daily.   Yes Historical Provider, MD  nicotine (NICODERM CQ - DOSED IN MG/24 HOURS) 21 mg/24hr patch Place 1 patch on skin for 2 weeks. 08/02/16  Yes Vishal Mungal, MD  pantoprazole (PROTONIX) 40 MG tablet Take 40 mg by mouth daily. 11/08/16  Yes Historical Provider, MD  potassium chloride (KLOR-CON) 20 MEQ packet Take 20 mEq by mouth daily.    Yes Historical Provider, MD  Tiotropium Bromide Monohydrate (SPIRIVA RESPIMAT) 2.5 MCG/ACT AERS Inhale 2 puffs into the lungs daily. 01/20/17  Yes [name], MD  gabapentin (NEURONTIN) 300 MG capsule Take 1 capsule (300 mg total) by mouth at bedtime. 02/04/17   Delman Kitten, MD  oxyCODONE (OXY IR/ROXICODONE) 5 MG immediate release tablet Take 1 tablet (5 mg total) by mouth every 6 (six) hours as needed for severe pain. 02/04/17   Delman Kitten, MD  predniSONE (DELTASONE) 20 MG tablet Take 2 tablets (40 mg total) by mouth daily. 02/04/17   Delman Kitten, MD    Allergies Aspirin; Lisinopril; and Varenicline  Family History  Problem Relation Age of Onset  . Heart disease Father   . Prostate cancer Father   . Heart disease Paternal Grandmother   . Heart attack Maternal Grandfather 52    Social History Social History  Substance Use Topics  . Smoking status: Current Every Day Smoker    Packs/day: 2.00    Years: 45.00    Types: Cigarettes  .  Smokeless tobacco: Never Used  . Alcohol use 1.2 oz/week    2 Standard drinks or equivalent per week    Review of Systems Constitutional: No fever/chills Eyes: No visual changes. ENT: No sore throat. Cardiovascular: Denies chest pain. Respiratory: Denies shortness of breath. Gastrointestinal: No nausea, no vomiting.  No diarrhea.  No constipation. Genitourinary: Negative for dysuria. Musculoskeletal: See history of present illness. No neck pain. No pain in the arms or weakness in the arms. No trouble speaking. Skin: Negative for rash. Neurological: Negative for headaches, focal weakness or numbness.  Denies traumatic injury  10-point ROS otherwise negative.  ____________________________________________   PHYSICAL EXAM:  VITAL SIGNS: ED Triage Vitals  Enc Vitals Group     BP 02/04/17 1235  123/89     Pulse Rate 02/04/17 1235 78     Resp 02/04/17 1235 10     Temp 02/04/17 1235 98 F (36.7 C)     Temp Source 02/04/17 1235 Oral     SpO2 02/04/17 1235 98 %     Weight 02/04/17 1231 196 lb (88.9 kg)     Height 02/04/17 1231 6' (1.829 m)     Head Circumference --      Peak Flow --      Pain Score 02/04/17 1231 10     Pain Loc --      Pain Edu? --      Excl. in [place]? --     Constitutional: Alert and oriented. Patient, appears uncomfortable in what appears to be severe pain somewhat writhing in the bed. Eyes: Conjunctivae are normal. PERRL. EOMI. Head: Atraumatic. Nose: No congestion/rhinnorhea. Mouth/Throat: Mucous membranes are moist.  Oropharynx non-erythematous. Neck: No stridor.  No cervical or thoracic tenderness. Reports mild tenderness to palpation of the mid to lower lumbar spine. Cardiovascular: Normal rate, regular rhythm. Grossly normal heart sounds.  Good peripheral circulation. Respiratory: Normal respiratory effort.  No retractions. Lungs CTAB. Gastrointestinal: Soft and nontender. No distention.  Musculoskeletal: No lower extremity tenderness nor edema.  No  joint effusions. Neurologic:  Normal speech and language.  Right hand Median, ulnar, radial motor intact. Cap refill less than 2 seconds all digits. Strong radial pulse. 5 out of 5 strength throughout the hand intrinsics, flexion and extension at the wrist. No evidence of trauma. Left hand Median, ulnar, radial motor intact. Cap refill less than 2 seconds all digits. Strong radial pulse. 5 out of 5 strength throughout the hand intrinsics, flexion and extension at the wrist. No evidence of trauma. ____________________________________________  Lower Extremities  No edema. Normal DP/PT pulses bilateral with good cap refill.  Normal neuro-motor function lower extremities bilateral.  RIGHT Right lower extremity demonstrates normal strength, good use of all muscles. No edema bruising or contusions of the right hip, right knee, right ankle. Full range of motion of the right lower extremity without pain. No pain on axial loading. No evidence of trauma.  LEFT Left lower extremity demonstrates approximately 4 or 5 strength, good use of all muscles notably weaker with plantar and dorsiflexion, with good strength at the hip flexors and extensors and knee, however some limitation due to pain when he attempts to raise the right leg off the bed. No edema bruising or contusions of the hip,  knee, ankle. Full range of motion of the left lower extremity.. No pain on axial loading. No evidence of trauma. Decreased sensation over the right foot, also up the right leg, with decreased sensation of the right lateral thigh, but he reports normal sensation over the right inner thigh Skin:  Skin is warm, dry and intact. No rash noted.  Strong posterior tibial and dorsal pedis pulses bilaterally with normal cap refill both lower extremities  Psychiatric: Mood and affect are normal. Speech and behavior are normal.  ____________________________________________   LABS (all labs ordered are listed, but only abnormal  results are displayed)  Labs Reviewed  CBC - Abnormal; Notable for the following:       Result Value   WBC 11.2 (*)    HCT 52.3 (*)    All other components within normal limits  BASIC METABOLIC PANEL - Abnormal; Notable for the following:    Sodium 132 (*)    Chloride 98 (*)    All other  components within normal limits  TROPONIN I   ____________________________________________  EKG  ED ECG REPORT I, QUALE, MARK, the attending physician, personally viewed and interpreted this ECG.  Date: 02/04/2017 EKG Time: 1310 Rate: 80 Rhythm: normal sinus rhythm QRS Axis: normal Intervals: normal ST/T Wave abnormalities: normal Conduction Disturbances: none Narrative Interpretation: unremarkable  ____________________________________________  RADIOLOGY  Dg Chest 2 View  Result Date: 02/04/2017 CLINICAL DATA:  Abnormal CT.  Evaluate for infiltrate or mass. EXAM: CHEST  2 VIEW COMPARISON:  Abdominal CT from earlier today. Also chest x-ray dated 09/09/2016 and chest CTs dated 09/09/2016, 08/23/2016 and 03/14/2015. FINDINGS: The small (approximately 10 mm) airspace opacity identified at the right lung base on earlier CT is not able to be visualized on this chest x-ray. Lungs appear clear. No pleural effusion or pneumothorax seen. Heart size and mediastinal contours are normal. No acute or suspicious osseous finding. IMPRESSION: 1. The small (approximately 10 mm) airspace opacity identified in the right middle lobe on the CT performed earlier today is not able to be visualized on this chest x-ray. In retrospect, this opacity is not significantly changed compared to earlier CTs dating back to 03/14/2015 indicating benignity, most likely focal chronic scarring. No follow-up imaging is necessary for this benign finding. 2. No acute findings.  No evidence of pneumonia or pulmonary edema. Electronically Signed   By: Franki Cabot M.D.   On: 02/04/2017 17:44   Mr Lumbar Spine Wo Contrast  Result Date:  02/04/2017 CLINICAL DATA:  61 year old with low back, right hip and leg pain today. Altered gait with numbness. No acute injury or prior relevant surgery. EXAM: MRI LUMBAR SPINE WITHOUT CONTRAST TECHNIQUE: Multiplanar, multisequence MR imaging of the lumbar spine was performed. No intravenous contrast was administered. COMPARISON:  Abdominopelvic CT 02/04/2017.  PET-CT 05/31/2016. FINDINGS: Segmentation: Conventional anatomy assumed, with the last open disc space designated L5-S1. Alignment:  Normal. Vertebrae: No worrisome osseous lesion, acute fracture or pars defect. The lumbar pedicles are diffusely short on a congenital basis. There are scattered small hemangiomas and mild endplate degenerative changes. The visualized sacroiliac joints appear unremarkable. Conus medullaris: Extends to the L1-2 level and appears normal. Paraspinal and other soft tissues: No significant paraspinal findings. Disc levels: There is mild disc bulging with anterior osteophytes from T11-12 through L1-2. No resulting spinal stenosis or nerve root encroachment. L2-3: Mild multifactorial spinal stenosis secondary to disc bulging, facet and ligamentous hypertrophy and congenital factors. No nerve root encroachment. L3-4: Annular disc bulging eccentric to the left with mild facet and ligamentous hypertrophy. There is mild spinal stenosis and mild narrowing of the lateral recesses and left foramen. No right-sided nerve root encroachment. L4-5: The disc is desiccated with annular bulging. There is a small disc extrusion within the right subarticular recess demonstrating caudal extension. There is resulting mass effect on the right L5 nerve root. There is underlying moderate spinal stenosis at this level due to disc bulging, facet and ligamentous hypertrophy and congenital factors. Both foramina are mildly narrowed without exiting nerve root encroachment. L5-S1: Chronic degenerative disc disease with loss of disc height, annular disc bulging  and endplate osteophytes. There is resulting moderate foraminal narrowing bilaterally which could contribute to exiting L5 nerve root encroachment. The spinal canal and lateral recesses are adequately patent. There is mild bilateral facet hypertrophy. IMPRESSION: 1. Congenitally short pedicles. 2. The most significant findings are at L4-5 where there is moderate multifactorial spinal stenosis and a small right-sided disc extrusion causing probable right L5 nerve root encroachment in  the lateral recess. 3. Chronic disc degeneration and endplate osteophytes contribute to moderate foraminal narrowing bilaterally at L5-S1 and possible exiting L5 nerve root encroachment. 4. Mild multifactorial spinal stenosis at L2-3 and L3-4. Electronically Signed   By: Richardean Sale M.D.   On: 02/04/2017 16:10   Dg Femur Min 2 Views Right  Result Date: 02/04/2017 CLINICAL DATA:  Right hip and low back pain beginning this morning. No injury. EXAM: RIGHT FEMUR 2 VIEWS COMPARISON:  None. FINDINGS: Examination demonstrates mild degenerative changes of the right hip. Mild degenerate changes of the right knee. No evidence of fracture dislocation. No focal lytic or sclerotic lesion. Calcified plaque over the right external iliac artery and femoral artery. IMPRESSION: No acute findings. Mild degenerate change of the right hip and knee. Electronically Signed   By: Marin Olp M.D.   On: 02/04/2017 14:14   Ct Angio Abd/pel W/ And/or W/o  Result Date: 02/04/2017 CLINICAL DATA:  right hip, lower back and leg pain. Patient states it started this morning and the pain radiates into the leg. States there is some tingling noted to the leg. Patients gait has a shuffle to it according to EMS and this is a new onset. Patient in NAD at this time with even and unlabored respirations. EXAM: CTA ABDOMEN AND PELVIS WITH CONTRAST TECHNIQUE: Multidetector CT imaging of the abdomen and pelvis was performed using the standard protocol during bolus  administration of intravenous contrast. Multiplanar reconstructed images and MIPs were obtained and reviewed to evaluate the vascular anatomy. CONTRAST:  100 mL Isovue 370 IV COMPARISON:  PET-CT 05/31/2016 and previous FINDINGS: VASCULAR Coronary calcifications. Aorta: Moderate partially calcified plaque. No aneurysm, dissection, or stenosis. Celiac: Patent without evidence of aneurysm, dissection, vasculitis or significant stenosis. SMA: Patent without evidence of aneurysm, dissection, vasculitis or significant stenosis. Renals: Both renal arteries are patent without evidence of aneurysm, dissection, vasculitis, fibromuscular dysplasia or significant stenosis. IMA: Patent without evidence of aneurysm, dissection, vasculitis or significant stenosis. Inflow: Scattered calcified plaque but no high-grade stenosis, aneurysm or dissection. Proximal Outflow: Eccentric calcified plaque. No aneurysm or stenosis. Veins: Patent hepatic veins, portal vein, SMV, splenic vein, bilateral renal veins, IVC, and iliac venous system. Review of the MIP images confirms the above findings. NON-VASCULAR Lower chest: No pleural or pericardial effusion. Focal subpleural branching airspace opacity in the anterior right middle lobe probably infectious/ inflammatory in etiology. Hepatobiliary: Stable hepatic cysts. No new lesion. Gallbladder physiologically distended. Pancreas: Unremarkable. No pancreatic ductal dilatation or surrounding inflammatory changes. Spleen: Normal in size without focal abnormality. Adrenals/Urinary Tract: Adrenal glands are unremarkable. Kidneys are normal, without renal calculi, focal lesion, or hydronephrosis. Bladder is unremarkable. Stomach/Bowel: Stomach and small bowel unremarkable. Normal appendix. Scattered diverticula from distal descending and sigmoid portions of the colon without significant adjacent inflammatory/edematous change. Lymphatic: No adenopathy localized. Reproductive: Mild prostatic  enlargement. Other: No ascites.  No free air. Musculoskeletal: Spondylitic changes in the visualized lower thoracic and lumbar spine, most marked L5-S1. No fracture or worrisome bone lesion. IMPRESSION: VASCULAR 1. No acute findings. 2. Aortoiliac atheromatous arterial plaque without aneurysm or stenosis. 3. Coronary calcifications. NON-VASCULAR 1. Focal airspace opacity in the anterior right middle lobe probably infectious/inflammatory. 2. Multilevel thoracolumbar spondylitic changes. 3. Stable benign hepatic cysts. Electronically Signed   By: Lucrezia Europe M.D.   On: 02/04/2017 15:09    ____________________________________________   PROCEDURES  Procedure(s) performed: None  Procedures  Critical Care performed: No  ____________________________________________   INITIAL IMPRESSION / ASSESSMENT AND PLAN / ED  COURSE  Pertinent labs & imaging results that were available during my care of the patient were reviewed by me and considered in my medical decision making (see chart for details).  Patient has for evaluation of sudden worsening of right sided lower back pain. He had 2 days of an aching pain, but now a sudden worsening with numbness and decreased strength noted in the distal portion of the right lower extremity somewhat more laterally than medially with regard to loss of sensation.  Patient is an active smoker with known coronary disease, is also reporting some mild abdominal pain. I most suspect the patient may have a sudden disc disease or referral spinal-type pain based on the history given, but I'm concerned also about the possibility for dissection given hypertension, continue smoking history and a component of reported pain in the right lower abdomen.    Clinical Course as of Feb 05 1804  Fri Feb 04, 2017  1625 Consult call placed to Dr. Cari Caraway, [facility] neurosurgery to review clinical history and MRI results for treatment recommendations  [MQ]    Clinical Course User  Index [MQ] Delman Kitten, MD    ----------------------------------------- 5:00 PM on 02/04/2017 -----------------------------------------  Patient's pain is improved. He is currently resting comfortably. I discussed his case and MRI imaging with Dr. Aris Lot of neurosurgery. He recommends conservative management but does offer the patient may be transferred to [facility] ER consult if the patient so wishes. On reexam the patient's strength is improved, 5 out of 5 strength throughout the right lower extremity. He reports his pain and numbness have subsided, and he has slight decrease in sensation over the lateral portion of the right thigh, but does appear his sensation is improved. There is no foot drop and he has 5 out of 5 strength in the foot and ankle now with dorsi and plantar flexion. No bowel or bladder issues  I discussed his CT finding of a possible pneumonia but the patient reports he has not had a cough, any fever, chest pain or other concerns but he does have a history of recent evaluations with pulmonology who is following him for chest mass which may be need to be biopsied again. I ordered a chest x-ray to follow-up on this, as the patient lacks infectious symptomatology  ----------------------------------------- 6:05 PM on 02/04/2017 -----------------------------------------  Patient comfortable plan for discharge. His wife will be driving him, understands not to drive while taking oxycodone. He'll be calling to set up close follow-up early this week with neurosurgery at [facility]. He will also continue to follow regularly with his pulmonologist  I will prescribe the patient a narcotic pain medicine due to their condition which I anticipate will cause at least moderate pain short term. I discussed with the patient safe use of narcotic pain medicines, and that they are not to drive, work in dangerous areas, or ever take more than prescribed (no more than 1 pill every 6 hours). We discussed  that this is the type of medication that can be  overdosed on and the risks of this type of medicine. Patient is very agreeable to only use as prescribed and to never use more than prescribed.  Return precautions and treatment recommendations and follow-up discussed with the patient who is agreeable with the plan.  ____________________________________________   FINAL CLINICAL IMPRESSION(S) / ED DIAGNOSES  Final diagnoses:  Low back pain  Herniated lumbar intervertebral disc      NEW MEDICATIONS STARTED DURING THIS VISIT:  New Prescriptions  GABAPENTIN (NEURONTIN) 300 MG CAPSULE    Take 1 capsule (300 mg total) by mouth at bedtime.   OXYCODONE (OXY IR/ROXICODONE) 5 MG IMMEDIATE RELEASE TABLET    Take 1 tablet (5 mg total) by mouth every 6 (six) hours as needed for severe pain.   PREDNISONE (DELTASONE) 20 MG TABLET    Take 2 tablets (40 mg total) by mouth daily.     Note:  This document was prepared using Dragon voice recognition software and may include unintentional dictation errors.     Delman Kitten, MD 02/04/17 [phone]

## 2017-02-04 NOTE — ED Notes (Signed)
Patient transported to X-ray and CT 

## 2017-02-17 ENCOUNTER — Encounter: Payer: Self-pay | Admitting: Urology

## 2017-02-17 ENCOUNTER — Ambulatory Visit: Payer: BLUE CROSS/BLUE SHIELD | Admitting: Urology

## 2017-02-17 VITALS — BP 132/80 | HR 85 | Ht 72.0 in | Wt 195.8 lb

## 2017-02-17 DIAGNOSIS — N529 Male erectile dysfunction, unspecified: Secondary | ICD-10-CM | POA: Diagnosis not present

## 2017-02-17 DIAGNOSIS — E291 Testicular hypofunction: Secondary | ICD-10-CM | POA: Diagnosis not present

## 2017-02-17 DIAGNOSIS — N486 Induration penis plastica: Secondary | ICD-10-CM | POA: Diagnosis not present

## 2017-02-17 DIAGNOSIS — G4733 Obstructive sleep apnea (adult) (pediatric): Secondary | ICD-10-CM

## 2017-02-17 DIAGNOSIS — N138 Other obstructive and reflux uropathy: Secondary | ICD-10-CM

## 2017-02-17 DIAGNOSIS — N401 Enlarged prostate with lower urinary tract symptoms: Secondary | ICD-10-CM | POA: Diagnosis not present

## 2017-02-17 NOTE — Progress Notes (Signed)
02/17/2017 12:08 PM   Paul Carpenter 20-Dec-1955 [id number]  Referring provider: Kirk Ruths, MD [facility] [place], [place] [address]  Chief Complaint  Patient presents with  . New Patient (Initial Visit)    HYPOGONADISM transferring care from by Dr. cope    HPI: Patient is a 61 year old Caucasian male with hypogonadism who would like to transfer his care to a local urologist, he is a former patient of Dr. Edrick Oh.  Hypogonadism Patient is experiencing a decrease in libido, a lack of energy, a decrease in strength, a loss in height, erections being less strong and a recent deterioration in an ability to play sports.   This is indicated by his responses to the ADAM questionnaire.  He is still having spontaneous erections at night.   He does have sleep apnea and is not using CPAP.  He is currently managing his hypogonadism with testosterone cypionate 200 mg, 1.5 IM q 2 weeks.  He has had episodes of elevated hematocrit and had undergone therapeutic phlebotomies in the past.        Androgen Deficiency in the Aging Male    [place] Name 02/17/17 1100         Androgen Deficiency in the Aging Male   Do you have a decrease in libido (sex drive) Yes     Do you have lack of energy Yes     Do you have a decrease in strength and/or endurance Yes     Have you lost height Yes     Have you noticed a decreased "enjoyment of life" No     Are you sad and/or grumpy No     Are your erections less strong Yes     Have you noticed a recent deterioration in your ability to play sports Yes     Are you falling asleep after dinner No     Has there been a recent deterioration in your work performance No       Erectile dysfunction His SHIM score is 19, which is mild ED.   He has been having difficulty with erections for over three years    His major complaint is maintaining  His libido is diminished   His risk factors for ED are age, BPH, hypogonadism, HLD,  sleep apnea, CAD, smoking, antidepressants and pain medication.  He denies any painful erections, but he has curvature with his erections.   It is not a greater than 30 degree curvature.  He is still having spontaneous erections.     SHIM    Row Name 02/17/17 1151         SHIM: Over the last 6 months:   How do you rate your confidence that you could get and keep an erection? Moderate     When you had erections with sexual stimulation, how often were your erections hard enough for penetration (entering your partner)? Most Times (much more than half the time)     During sexual intercourse, how often were you able to maintain your erection after you had penetrated (entered) your partner? Most Times (much more than half the time)     During sexual intercourse, how difficult was it to maintain your erection to completion of intercourse? Slightly Difficult     When you attempted sexual intercourse, how often was it satisfactory for you? Most Times (much more than half the time)       SHIM Total Score   SHIM  19        Score: 1-7 Severe ED 8-11 Moderate ED 12-16 Mild-Moderate ED 17-21 Mild ED 22-25 No ED   BPH WITH LUTS His IPSS score today is 14, which is moderate lower urinary tract symptomatology. He is mostly satisfied with his quality life due to his urinary symptoms.  His major complaints today are frequency, urgency, nocturia and intermittency.  These symptoms occur mostly in the evening.  He has had these symptoms for several years.  He denies any dysuria, hematuria or suprapubic pain.  He also denies any recent fevers, chills, nausea or vomiting.  His father has been diagnosed with non fatal prostate cancer.       IPSS    Row Name 02/17/17 1100         International Prostate Symptom Score   How often have you had the sensation of not emptying your bladder? Less than 1 in 5     How often have you had to urinate less than every two hours? About half the time     How often have  you found you stopped and started again several times when you urinated? Less than half the time     How often have you found it difficult to postpone urination? More than half the time     How often have you had a weak urinary stream? Less than half the time     How often have you had to strain to start urination? Not at All     How many times did you typically get up at night to urinate? 2 Times     Total IPSS Score 14       Quality of Life due to urinary symptoms   If you were to spend the rest of your life with your urinary condition just the way it is now how would you feel about that? Mostly Satisfied        Score:  1-7 Mild 8-19 Moderate 20-35 Severe  PMH: Past Medical History:  Diagnosis Date  . Anginal pain (Uncertain)   . Anxiety   . Chicken pox   . Complication of anesthesia    o2 dropped after neck fusion  . COPD (chronic obstructive pulmonary disease) ([place])   . Coronary artery disease   . Cough    chronic  clear phlegm  . Dysrhythmia   . GERD (gastroesophageal reflux disease)    h/o reflux/ hoarsness  . Hematochezia   . Hemorrhoids   . Hoarseness   . Hypertension   . Migraines   . OSA (obstructive sleep apnea)    has CPAP but does not use  . Personal history of tobacco use, presenting hazards to health 03/05/2016  . Pneumonia    5/17  . Raynaud disease   . Raynaud's disease   . Rotator cuff tear    on right  . Shortness of breath dyspnea   . Sleep apnea   . Ulcer (traumatic) of oral mucosa     Surgical History: Past Surgical History:  Procedure Laterality Date  . BACK SURGERY     cervical fusion x 2  . CARDIAC CATHETERIZATION    . CERVICAL DISCECTOMY    . COLONOSCOPY    . COLONOSCOPY N/A 07/25/2015   Procedure: COLONOSCOPY;  Surgeon: Lollie Sails, MD;  Location: [facility] ENDOSCOPY;  Service: Endoscopy;  Laterality: N/A;  . ELECTROMAGNETIC NAVIGATION BROCHOSCOPY Left 06/28/2016   Procedure: ELECTROMAGNETIC NAVIGATION BRONCHOSCOPY;  Surgeon: Vilinda Boehringer, MD;  Location:  [facility] ORS;  Service: Cardiopulmonary;  Laterality: Left;  . ESOPHAGOGASTRODUODENOSCOPY N/A 07/25/2015   Procedure: ESOPHAGOGASTRODUODENOSCOPY (EGD);  Surgeon: Lollie Sails, MD;  Location: [facility] ENDOSCOPY;  Service: Endoscopy;  Laterality: N/A;  . NASAL SINUS SURGERY     x 2   . NECK SURGERY    . rotator cuff surgery Right    07/2016  . SEPTOPLASTY    . SKIN GRAFT      Home Medications:  Allergies as of 02/17/2017      Reactions   Lisinopril Other (See Comments)   Varenicline Other (See Comments)      Medication List       Accurate as of 02/17/17 12:08 PM. Always use your most recent med list.          albuterol 108 (90 Base) MCG/ACT inhaler Commonly known as:  PROAIR HFA Inhale 2 puffs into the lungs every 6 (six) hours as needed for wheezing or shortness of breath.   aspirin EC 81 MG tablet Take 81 mg by mouth daily.   atorvastatin 40 MG tablet Commonly known as:  LIPITOR Take 1 tablet by mouth daily.   cyclobenzaprine 5 MG tablet Commonly known as:  FLEXERIL Take by mouth.   diazepam 5 MG tablet Commonly known as:  VALIUM Take 5 mg by mouth at bedtime as needed for anxiety.   fluticasone 50 MCG/ACT nasal spray Commonly known as:  FLONASE Place 1 spray into both nostrils daily.   fluticasone furoate-vilanterol 200-25 MCG/INH Aepb Commonly known as:  BREO ELLIPTA Inhale 1 puff into the lungs daily.   gabapentin 300 MG capsule Commonly known as:  NEURONTIN Take 1 capsule (300 mg total) by mouth at bedtime.   ipratropium-albuterol 0.5-2.5 (3) MG/3ML Soln Commonly known as:  DUONEB Take 3 mLs by nebulization every 6 (six) hours.   metoprolol tartrate 25 MG tablet Commonly known as:  LOPRESSOR Take 25 mg by mouth 2 (two) times daily.   nicotine 21 mg/24hr patch Commonly known as:  NICODERM CQ - dosed in mg/24 hours Place 1 patch on skin for 2 weeks.   oxyCODONE 5 MG immediate release tablet Commonly known as:  Oxy  IR/ROXICODONE Take 1 tablet (5 mg total) by mouth every 6 (six) hours as needed for severe pain.   OXYGEN Inhale into the lungs. 2 units at night   pantoprazole 40 MG tablet Commonly known as:  PROTONIX Take 40 mg by mouth daily.   potassium chloride 20 MEQ packet Commonly known as:  KLOR-CON Take 20 mEq by mouth daily.   predniSONE 20 MG tablet Commonly known as:  DELTASONE Take 2 tablets (40 mg total) by mouth daily.   Tiotropium Bromide Monohydrate 2.5 MCG/ACT Aers Commonly known as:  SPIRIVA RESPIMAT Inhale 2 puffs into the lungs daily.       Allergies:  Allergies  Allergen Reactions  . Lisinopril Other (See Comments)  . Varenicline Other (See Comments)    Family History: Family History  Problem Relation Age of Onset  . Heart disease Father   . Prostate cancer Father   . Heart disease Paternal Grandmother   . Heart attack Maternal Grandfather 52  . Kidney cancer Neg Hx   . Bladder Cancer Neg Hx     Social History:  reports that he has been smoking Cigarettes.  He has a 90.00 pack-year smoking history. He has never used smokeless tobacco. He reports that he drinks about 1.2 oz of alcohol per week . He reports that he does  not use drugs.  ROS: UROLOGY Frequent Urination?: Yes Hard to postpone urination?: Yes Burning/pain with urination?: No Get up at night to urinate?: Yes Leakage of urine?: No Urine stream starts and stops?: Yes Trouble starting stream?: No Do you have to strain to urinate?: No Blood in urine?: No Urinary tract infection?: No Sexually transmitted disease?: No Injury to kidneys or bladder?: No Painful intercourse?: No Weak stream?: No Erection problems?: Yes Penile pain?: No  Gastrointestinal Nausea?: No Vomiting?: No Indigestion/heartburn?: No Diarrhea?: No Constipation?: No  Constitutional Fever: No Night sweats?: No Weight loss?: No Fatigue?: No  Skin Skin rash/lesions?: No Itching?: No  Eyes Blurred vision?:  Yes Double vision?: Yes  Ears/Nose/Throat Sore throat?: No Sinus problems?: No  Hematologic/Lymphatic Swollen glands?: No Easy bruising?: No  Cardiovascular Leg swelling?: No Chest pain?: No  Respiratory Cough?: Yes Shortness of breath?: Yes  Endocrine Excessive thirst?: No  Musculoskeletal Back pain?: Yes Joint pain?: Yes  Neurological Headaches?: No Dizziness?: Yes  Psychologic Depression?: No Anxiety?: No  Physical Exam: BP 132/80   Pulse 85   Ht 6' (1.829 m)   Wt 195 lb 12.8 oz (88.8 kg)   BMI 26.56 kg/m   Constitutional: Well nourished. Alert and oriented, No acute distress. HEENT: [place] AT, moist mucus membranes. Trachea midline, no masses. Cardiovascular: No clubbing, cyanosis, or edema. Respiratory: Normal respiratory effort, no increased work of breathing. GI: Abdomen is soft, non tender, non distended, no abdominal masses. Liver and spleen not palpable.  No hernias appreciated.  Stool sample for occult testing is not indicated.   GU: No CVA tenderness.  No bladder fullness or masses.  Patient with circumcised phallus.  Urethral meatus is patent.  No penile discharge. No penile lesions or rashes. Scrotum without lesions, cysts, rashes and/or edema.  Testicles are located scrotally bilaterally. No masses are appreciated in the testicles. Left and right epididymis are normal. Rectal: Patient with  normal sphincter tone. Anus and perineum without scarring or rashes. No rectal masses are appreciated. Prostate is approximately 50 grams, no nodules are appreciated. Seminal vesicles are normal. Skin: No rashes, bruises or suspicious lesions. Lymph: No cervical or inguinal adenopathy. Neurologic: Grossly intact, no focal deficits, moving all 4 extremities. Psychiatric: Normal mood and affect.  Laboratory Data: PSA History  0.8 ng/mL on 04/22/2014  1.0 ng/mL on 10/21/2014  1.07 ng/mL on 08/18/2016  Lab Results  Component Value Date   WBC 11.2 (H) 02/04/2017    HGB 18.0 02/04/2017   HCT 52.3 (H) 02/04/2017   MCV 93.2 02/04/2017   PLT 226 02/04/2017    Lab Results  Component Value Date   CREATININE 0.86 02/04/2017    Lab Results  Component Value Date   AST 24 06/01/2016   Lab Results  Component Value Date   ALT 26 06/01/2016      Assessment & Plan:    1. Hypogonadism  - I explained to patient that the current recommendations from the Endocrine Society reports the diagnosis of hypogonadism requires a serum total testosterone level obtained between 8 and 10 AM at least 2 days apart that is below the laboratory parameters  for normal testosterone.     - At this time, the patient does not meet this requirement.  He will return for two morning serum testosterones, two days apart before 10 AM   -I discussed with the patient the side effects of testosterone therapy, such as: enlargement of the prostate gland that may in turn cause LUTS, possible increased risk of  PCa, DVT's and/or PE's, possible increased risk of heart attack or stroke, lower sperm count, swelling of the ankles, feet, or body, with or without heart failure, enlarged or painful breasts, have problems breathing while you sleep (sleep apnea), increased prostate specific antigen, mood swings, hypertension and increased red blood cell count.  - I also discussed that some men have had success using clomid for hypogonadism.  It does seem to be more successful in younger men, but there are incidences of good results in middle-aged men.  I explained that it is used in male infertility to stimulate the testicles to make more testosterone/sperm.  There has been no long term data on side effects, but some urologists has been having success with this medication.   2. BPH with LUTS  - IPSS score is 14/2  - Continue conservative management, avoiding bladder irritants and timed voiding's  - RTC in 6 months for IPSS, PSA, PVR and exam, as testosterone therapy can cause prostate enlargement and  worsen LUTS  3. Erectile dysfunction:     -SHIM score is 18  -RTC in 6 months for SHIM score and exam, as testosterone therapy can affect erections  4. Peyronie's disease  - Curvature is not greater than 30 degrees at this time  - Will continue to monitor  5. Sleep apnea  - STRONGLY encouraged the patient to sleep with CPAP machine as sleep apnea contributes to polycythemia, erectile dysfunction and will worsen with testosterone therapy there is also risk of death in individuals with untreated sleep apnea  Return for labs in the am tomorrow and wednesday.  These notes generated with voice recognition software. I apologize for typographical errors.  Zara Council, [facility] [address] [place], [place] [address] [phone]

## 2017-02-18 ENCOUNTER — Other Ambulatory Visit: Payer: Self-pay

## 2017-02-18 ENCOUNTER — Other Ambulatory Visit: Payer: BLUE CROSS/BLUE SHIELD

## 2017-02-18 DIAGNOSIS — E291 Testicular hypofunction: Secondary | ICD-10-CM

## 2017-02-19 LAB — TESTOSTERONE: Testosterone: 171 ng/dL — ABNORMAL LOW (ref 264–916)

## 2017-02-21 ENCOUNTER — Telehealth: Payer: Self-pay

## 2017-02-21 NOTE — Telephone Encounter (Signed)
-----   Message from Nori Riis, PA-C sent at 02/21/2017  8:04 AM EDT ----- Patient's testosterone returned low.  He will be returning sometime this week before 10AM for his second testosterone draw.

## 2017-02-21 NOTE — Telephone Encounter (Signed)
Spoke with pt in reference to lab results. Pt voiced understanding.  

## 2017-02-22 ENCOUNTER — Other Ambulatory Visit: Payer: Self-pay

## 2017-02-22 DIAGNOSIS — E291 Testicular hypofunction: Secondary | ICD-10-CM

## 2017-02-23 ENCOUNTER — Ambulatory Visit: Payer: BLUE CROSS/BLUE SHIELD

## 2017-02-23 ENCOUNTER — Other Ambulatory Visit: Payer: BLUE CROSS/BLUE SHIELD

## 2017-02-23 ENCOUNTER — Telehealth: Payer: Self-pay | Admitting: *Deleted

## 2017-02-23 DIAGNOSIS — E291 Testicular hypofunction: Secondary | ICD-10-CM

## 2017-02-23 NOTE — Telephone Encounter (Signed)
His CT got rescheduled due to insurance not authorizing yet, It was rescheduled to 5/1. He has an appt for lab/ MD/ possible Phlebotomy on 4/30 when he was to also get results of his CT scan and is asking if that appt should also be rescheduled too. Please return his call [phone]

## 2017-02-24 ENCOUNTER — Telehealth: Payer: Self-pay

## 2017-02-24 DIAGNOSIS — E291 Testicular hypofunction: Secondary | ICD-10-CM

## 2017-02-24 LAB — TESTOSTERONE: Testosterone: 256 ng/dL — ABNORMAL LOW (ref 264–916)

## 2017-02-24 MED ORDER — TESTOSTERONE CYPIONATE 200 MG/ML IM SOLN: 200.0000 mg | INTRAMUSCULAR | 0 refills | Status: DC

## 2017-02-24 NOTE — Telephone Encounter (Signed)
-----   Message from Nori Riis, PA-C sent at 02/24/2017  7:04 AM EDT ----- Please let Mr. Overley know that his second testosterone is low.  Please add a LH/FSH and prolactin to this blood work.  He may start the injections with [place].

## 2017-02-24 NOTE — Telephone Encounter (Signed)
Yes.  Testosterone cypionate 200 mg/milliliters, 1 cc every 2 weeks and we will recheck his testosterone level I week after the fourth injection.

## 2017-02-24 NOTE — Telephone Encounter (Signed)
Spoke with pt in reference to lab results and starting injections. Pt voiced understanding.  Labs added to pt blood work.  '200mg'$ /mL q2 weeks? Please advise

## 2017-02-24 NOTE — Telephone Encounter (Signed)
Medication sent to pharmacy  

## 2017-02-25 ENCOUNTER — Ambulatory Visit: Payer: BLUE CROSS/BLUE SHIELD

## 2017-02-28 ENCOUNTER — Inpatient Hospital Stay: Payer: BLUE CROSS/BLUE SHIELD

## 2017-02-28 ENCOUNTER — Inpatient Hospital Stay: Payer: BLUE CROSS/BLUE SHIELD | Admitting: Oncology

## 2017-02-28 ENCOUNTER — Telehealth: Payer: Self-pay

## 2017-02-28 NOTE — Telephone Encounter (Signed)
LMOM

## 2017-02-28 NOTE — Telephone Encounter (Signed)
-----   Message from Nori Riis, PA-C sent at 02/26/2017 11:38 AM EDT ----- Patient's LH and FSH are elevated.  This is most likely due to his COPD and use of opioids and prednisone over the years for his spinal issues.

## 2017-02-28 NOTE — Telephone Encounter (Signed)
Spoke with pt in reference to lab results. Pt inquired about receiving testosterone injections and their safety. Please advise.

## 2017-03-01 ENCOUNTER — Ambulatory Visit: Payer: BLUE CROSS/BLUE SHIELD

## 2017-03-02 ENCOUNTER — Telehealth: Payer: Self-pay

## 2017-03-02 LAB — PROLACTIN: PROLACTIN: 7.2 ng/mL (ref 4.0–15.2)

## 2017-03-02 LAB — FSH/LH
FSH: 14.3 m[IU]/mL — AB (ref 1.5–12.4)
LH: 16.6 m[IU]/mL — ABNORMAL HIGH (ref 1.7–8.6)

## 2017-03-02 LAB — SPECIMEN STATUS REPORT

## 2017-03-02 NOTE — Telephone Encounter (Signed)
-----   Message from [name], MD sent at 03/02/2017  1:07 PM EDT ----- Regarding: RE: Denial Approved.  #[id number]  ----- Message ----- From: Luretha Hunnell, CMA Sent: 03/01/2017   9:54 AM To: [name], MD Subject: FW: Denial                                     Patient called again this morning, I advised him that we are working on trying to get this issue resolved. This has been put off and rescheduled twice due to insurance.  ----- Message ----- From: Luretha Silverstein, CMA Sent: 02/28/2017   3:42 PM To: [name], MD Subject: Denial                                         Patient called today he received a denial letter for his CT was asking if this was being taken care of or if he needed to do anything. ----- Message ----- From: Loa Socks Sent: 02/24/2017   9:34 AM To: [name], MD, [facility], RN, #  Per [employer] CT chest request is not approved.  Please call [phone] for peer/peer. Thank you.

## 2017-03-02 NOTE — Telephone Encounter (Signed)
Called patient and let him know it was approved.

## 2017-03-03 ENCOUNTER — Ambulatory Visit: Payer: BLUE CROSS/BLUE SHIELD | Admitting: Oncology

## 2017-03-03 ENCOUNTER — Other Ambulatory Visit: Payer: BLUE CROSS/BLUE SHIELD

## 2017-03-03 NOTE — Telephone Encounter (Signed)
Please let the patient know that testosterone replacement therapy has the potential to cause enlargement of the prostate gland that may worsen LUTS, stimulate prostate cancer, possible increased risk of heart attack or stroke, lower sperm count, swelling of the ankles, feet, or body, with or without heart failure, enlarged or painful breasts, have problems breathing while you sleep (sleep apnea), mood swings, hypertension and increased red blood cell count.  This is why we monitor the patient closely while they are on testosterone therapy.

## 2017-03-04 ENCOUNTER — Ambulatory Visit
Admission: RE | Admit: 2017-03-04 | Discharge: 2017-03-04 | Disposition: A | Discharge status: Home / Self Care | Payer: BLUE CROSS/BLUE SHIELD | Source: Ambulatory Visit | Attending: Oncology | Admitting: Oncology

## 2017-03-04 DIAGNOSIS — I2510 Atherosclerotic heart disease of native coronary artery without angina pectoris: Secondary | ICD-10-CM | POA: Insufficient documentation

## 2017-03-04 DIAGNOSIS — I700 Atherosclerosis of aorta: Secondary | ICD-10-CM | POA: Diagnosis not present

## 2017-03-04 DIAGNOSIS — R918 Other nonspecific abnormal finding of lung field: Secondary | ICD-10-CM | POA: Diagnosis not present

## 2017-03-04 DIAGNOSIS — J439 Emphysema, unspecified: Secondary | ICD-10-CM | POA: Insufficient documentation

## 2017-03-04 MED ORDER — IOPAMIDOL (ISOVUE-300) INJECTION 61%
75.0000 mL | Freq: Once | INTRAVENOUS | Status: AC | PRN
  Administered 2017-03-04: 75 mL via INTRAVENOUS

## 2017-03-06 NOTE — Progress Notes (Signed)
[place]  [phone] [phone]) [phone]  ID: Paul Carpenter OB: 08-23-1956  MR#: [id number]  HWT#:[id number]  Patient Care Team: Kirk Ruths, MD as PCP - General (Internal Medicine)  CHIEF COMPLAINT: Secondary polycythemia  INTERVAL HISTORY: Patient returns to clinic today for further evaluation, discussion of his imaging results, and consideration of phlebotomy. He continues to be anxious, but otherwise feels well. He has recently discontinued testosterone. He has no neurologic complaints. He denies any recent fevers or illnesses. He has no chest pain, cough, hemoptysis, or shortness of breath. He denies any nausea, vomiting, constipation, or diarrhea. He has no urinary complaints. Patient offers no further specific complaints today.   REVIEW OF SYSTEMS:   Review of Systems  Constitutional: Negative.  Negative for fever, malaise/fatigue and weight loss.  Respiratory: Negative.  Negative for cough and shortness of breath.   Cardiovascular: Negative.  Negative for chest pain.  Gastrointestinal: Negative.  Negative for abdominal pain, blood in stool and melena.  Genitourinary: Negative.   Musculoskeletal: Positive for joint pain.  Neurological: Negative.  Negative for sensory change and weakness.  Psychiatric/Behavioral: The patient is nervous/anxious.     As per HPI. Otherwise, a complete review of systems is negative.  PAST MEDICAL HISTORY: Past Medical History:  Diagnosis Date  . Anginal pain ([place])   . Anxiety   . Chicken pox   . Complication of anesthesia    o2 dropped after neck fusion  . COPD (chronic obstructive pulmonary disease) ([place])   . Coronary artery disease   . Cough    chronic  clear phlegm  . Dysrhythmia   . GERD (gastroesophageal reflux disease)    h/o reflux/ hoarsness  . Hematochezia   . Hemorrhoids   . Hoarseness   . Hypertension   . Migraines   . OSA (obstructive sleep apnea)    has CPAP but does not use    . Personal history of tobacco use, presenting hazards to health 03/05/2016  . Pneumonia    5/17  . Raynaud disease   . Raynaud's disease   . Rotator cuff tear    on right  . Shortness of breath dyspnea   . Sleep apnea   . Ulcer (traumatic) of oral mucosa     PAST SURGICAL HISTORY: Past Surgical History:  Procedure Laterality Date  . BACK SURGERY     cervical fusion x 2  . CARDIAC CATHETERIZATION    . CERVICAL DISCECTOMY    . COLONOSCOPY    . COLONOSCOPY N/A 07/25/2015   Procedure: COLONOSCOPY;  Surgeon: Lollie Sails, MD;  Location[facility] END[facility]COPY;  Service: Endoscopy;  Laterality: N/A;  . ELECTROMAGNETIC NAVIGATION BROCHOSCOPY Left 06/28/2016   Procedure: ELECTROMAGNETIC NAVIGATION BRONCHOSCOPY;  Surgeon: Vilinda Boehringer, MD;  Location: [facility] ORS;  Service: Cardiopulmonary;  Laterality: Left;  . ESOPHAGOGASTRODUODENOSCOPY N/A 07/25/2015   Procedure: ESOPHAGOGASTRODUODENOSCOPY (EGD);  Surgeon: Lollie Sails, MD;  Location: [facility] ENDOSCOPY;  Service: Endoscopy;  Laterality: N/A;  . NASAL SINUS SURGERY     x 2   . NECK SURGERY    . rotator cuff surgery Right    07/2016  . SEPTOPLASTY    . SKIN GRAFT      FAMILY HISTORY Family History  Problem Relation Age of Onset  . Heart disease Father   . Prostate cancer Father   . Heart disease Paternal Grandmother   . Heart attack Maternal Grandfather 52  . Kidney cancer Neg Hx   . Bladder Cancer Neg Hx  ADVANCED DIRECTIVES:    HEALTH MAINTENANCE: Social History  Substance Use Topics  . Smoking status: Current Every Day Smoker    Packs/day: 2.00    Years: 45.00    Types: Cigarettes  . Smokeless tobacco: Never Used  . Alcohol use 1.2 oz/week    2 Standard drinks or equivalent per week     Allergies  Allergen Reactions  . Lisinopril Other (See Comments)  . Varenicline Other (See Comments)    Current Outpatient Prescriptions  Medication Sig Dispense Refill  . albuterol (PROAIR HFA) 108 (90 Base) MCG/ACT  inhaler Inhale 2 puffs into the lungs every 6 (six) hours as needed for wheezing or shortness of breath. 1 Inhaler 3  . aspirin EC 81 MG tablet Take 81 mg by mouth daily.     Marland Kitchen atorvastatin (LIPITOR) 40 MG tablet Take 1 tablet by mouth daily.    . cyclobenzaprine (FLEXERIL) 5 MG tablet Take by mouth.    . diazepam (VALIUM) 5 MG tablet Take 5 mg by mouth at bedtime as needed for anxiety.     Marland Kitchen escitalopram (LEXAPRO) 10 MG tablet Take by mouth.    . fluticasone (FLONASE) 50 MCG/ACT nasal spray Place 1 spray into both nostrils daily.    . fluticasone furoate-vilanterol (BREO ELLIPTA) 200-25 MCG/INH AEPB Inhale 1 puff into the lungs daily. 60 each 5  . gabapentin (NEURONTIN) 300 MG capsule Take 1 capsule (300 mg total) by mouth at bedtime. (Patient taking differently: Take 300 mg by mouth 2 (two) times daily. ) 14 capsule 0  . ipratropium-albuterol (DUONEB) 0.5-2.5 (3) MG/3ML SOLN Take 3 mLs by nebulization every 6 (six) hours. 360 mL 5  . metoprolol tartrate (LOPRESSOR) 25 MG tablet Take 25 mg by mouth 2 (two) times daily.    Marland Kitchen oxyCODONE (OXY IR/ROXICODONE) 5 MG immediate release tablet Take 1 tablet (5 mg total) by mouth every 6 (six) hours as needed for severe pain. 12 tablet 0  . OXYGEN Inhale into the lungs. 2 units at night    . pantoprazole (PROTONIX) 40 MG tablet Take 40 mg by mouth daily.  3  . potassium chloride (KLOR-CON) 20 MEQ packet Take 20 mEq by mouth daily.     . predniSONE (DELTASONE) 20 MG tablet Take 2 tablets (40 mg total) by mouth daily. 10 tablet 0  . testosterone cypionate (DEPOTESTOSTERONE CYPIONATE) 200 MG/ML injection Inject 1 mL (200 mg total) into the muscle every 14 (fourteen) days. 10 mL 0  . Tiotropium Bromide Monohydrate (SPIRIVA RESPIMAT) 2.5 MCG/ACT AERS Inhale 2 puffs into the lungs daily. 1 Inhaler 5   No current facility-administered medications for this visit.     OBJECTIVE: Vitals:   03/07/17 1458  BP: (!) 143/76  Pulse: 89  Temp: 97.9 F (36.6 C)      Body mass index is 26.6 kg/m.    ECOG FS:0 - Asymptomatic  General: Well-developed, well-nourished, no acute distress. Eyes: anicteric sclera. Lungs: Clear to auscultation bilaterally. Heart: Regular rate and rhythm. No rubs, murmurs, or gallops. Abdomen: Soft, nontender, nondistended. N Musculoskeletal: No edema, cyanosis, or clubbing. Neuro: Alert, answering all questions appropriately.  Skin: No rashes or petechiae noted. Psych: Appears anxious   LAB RESULTS:  Lab Results  Component Value Date   NA 132 (L) 02/04/2017   K 4.3 02/04/2017   CL 98 (L) 02/04/2017   CO2 26 02/04/2017   GLUCOSE 96 02/04/2017   BUN 15 02/04/2017   CREATININE 0.86 02/04/2017   CALCIUM 9.1 02/04/2017  PROT 6.9 06/01/2016   ALBUMIN 4.2 06/01/2016   AST 24 06/01/2016   ALT 26 06/01/2016   ALKPHOS 74 06/01/2016   BILITOT 0.8 06/01/2016   GFRNONAA >60 02/04/2017   GFRAA >60 02/04/2017    Lab Results  Component Value Date   WBC 10.1 03/07/2017   NEUTROABS 7.9 (H) 03/07/2017   HGB 15.7 03/07/2017   HCT 44.1 03/07/2017   MCV 89.7 03/07/2017   PLT 233 03/07/2017     STUDIES:  ASSESSMENT: Secondary polycythemia.  PLAN:    1.  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 Patient was also taking testosterone, but this has now been discontinued.  Previously, the remainder of his laboratory work, including JAK-2 mutation, was either negative or within normal limits. He does not require phlebotomy today.  Goal hemoglobin will be approximately 17.0. Return to clinic in 6 months for further evaluation.  2. Left upper lobe pulmonary nodule: Biopsy was negative for malignant cells. CT results from Mar 04, 2017 reviewed independently with no interval change. Recommendation is to continue yearly imaging under the lung screening program.  3.  Colon polyps:  Patient has a personal history of greater then 10 adenomatous polyps on his most recent conoloscopy. He does not know of any family  history of increased polyps or colon cancer.  Genetic testing to assess for the APC mutation for FAP or AFAP was negative. Continue colonoscopies as per GI. 4. Tobacco Use: Patient continues to heavily smoke, but has decreased significantly. 5. Anxiety: Treatment as per pcp.   Patient expressed understanding and was in agreement with this plan. He also understands that He can call clinic at any time with any questions, concerns, or complaints.    [name], MD   03/07/2017 5:17 PM

## 2017-03-07 ENCOUNTER — Inpatient Hospital Stay: Payer: BLUE CROSS/BLUE SHIELD

## 2017-03-07 ENCOUNTER — Inpatient Hospital Stay: Payer: BLUE CROSS/BLUE SHIELD | Attending: Oncology

## 2017-03-07 ENCOUNTER — Inpatient Hospital Stay (HOSPITAL_BASED_OUTPATIENT_CLINIC_OR_DEPARTMENT_OTHER): Payer: BLUE CROSS/BLUE SHIELD | Admitting: Oncology

## 2017-03-07 ENCOUNTER — Encounter: Payer: Self-pay | Admitting: Oncology

## 2017-03-07 VITALS — BP 143/76 | HR 89 | Temp 97.9°F | Wt 196.1 lb

## 2017-03-07 DIAGNOSIS — J449 Chronic obstructive pulmonary disease, unspecified: Secondary | ICD-10-CM | POA: Insufficient documentation

## 2017-03-07 DIAGNOSIS — G4730 Sleep apnea, unspecified: Secondary | ICD-10-CM | POA: Diagnosis not present

## 2017-03-07 DIAGNOSIS — I2510 Atherosclerotic heart disease of native coronary artery without angina pectoris: Secondary | ICD-10-CM | POA: Diagnosis not present

## 2017-03-07 DIAGNOSIS — D751 Secondary polycythemia: Secondary | ICD-10-CM | POA: Diagnosis not present

## 2017-03-07 DIAGNOSIS — Z86010 Personal history of colonic polyps: Secondary | ICD-10-CM

## 2017-03-07 DIAGNOSIS — D123 Benign neoplasm of transverse colon: Secondary | ICD-10-CM | POA: Diagnosis not present

## 2017-03-07 DIAGNOSIS — R918 Other nonspecific abnormal finding of lung field: Secondary | ICD-10-CM

## 2017-03-07 DIAGNOSIS — F17210 Nicotine dependence, cigarettes, uncomplicated: Secondary | ICD-10-CM | POA: Diagnosis not present

## 2017-03-07 DIAGNOSIS — K219 Gastro-esophageal reflux disease without esophagitis: Secondary | ICD-10-CM | POA: Insufficient documentation

## 2017-03-07 DIAGNOSIS — F419 Anxiety disorder, unspecified: Secondary | ICD-10-CM | POA: Insufficient documentation

## 2017-03-07 DIAGNOSIS — I7300 Raynaud's syndrome without gangrene: Secondary | ICD-10-CM | POA: Insufficient documentation

## 2017-03-07 DIAGNOSIS — I209 Angina pectoris, unspecified: Secondary | ICD-10-CM | POA: Insufficient documentation

## 2017-03-07 DIAGNOSIS — I10 Essential (primary) hypertension: Secondary | ICD-10-CM | POA: Diagnosis not present

## 2017-03-07 DIAGNOSIS — Z7982 Long term (current) use of aspirin: Secondary | ICD-10-CM | POA: Insufficient documentation

## 2017-03-07 DIAGNOSIS — G43909 Migraine, unspecified, not intractable, without status migrainosus: Secondary | ICD-10-CM | POA: Insufficient documentation

## 2017-03-07 LAB — CBC WITH DIFFERENTIAL/PLATELET
Basophils Absolute: 0.1 10*3/uL (ref 0–0.1)
Basophils Relative: 1 %
EOS PCT: 1 %
Eosinophils Absolute: 0.1 10*3/uL (ref 0–0.7)
HCT: 44.1 % (ref 40.0–52.0)
Hemoglobin: 15.7 g/dL (ref 13.0–18.0)
LYMPHS ABS: 1.5 10*3/uL (ref 1.0–3.6)
LYMPHS PCT: 15 %
MCH: 31.9 pg (ref 26.0–34.0)
MCHC: 35.5 g/dL (ref 32.0–36.0)
MCV: 89.7 fL (ref 80.0–100.0)
MONO ABS: 0.5 10*3/uL (ref 0.2–1.0)
Monocytes Relative: 5 %
Neutro Abs: 7.9 10*3/uL — ABNORMAL HIGH (ref 1.4–6.5)
Neutrophils Relative %: 78 %
PLATELETS: 233 10*3/uL (ref 150–440)
RBC: 4.92 MIL/uL (ref 4.40–5.90)
RDW: 13.3 % (ref 11.5–14.5)
WBC: 10.1 10*3/uL (ref 3.8–10.6)

## 2017-03-07 NOTE — Telephone Encounter (Signed)
Spoke with pt in reference to concerns about testosterone therapy. Pt voiced understanding.

## 2017-03-09 ENCOUNTER — Telehealth: Payer: Self-pay

## 2017-03-09 NOTE — Telephone Encounter (Signed)
PA for testosterone cypionate has been APPROVED! Pt has been made aware and will call to make nurse visit once he has medication.

## 2017-03-16 ENCOUNTER — Ambulatory Visit (INDEPENDENT_AMBULATORY_CARE_PROVIDER_SITE_OTHER): Payer: BLUE CROSS/BLUE SHIELD

## 2017-03-16 DIAGNOSIS — E291 Testicular hypofunction: Secondary | ICD-10-CM | POA: Diagnosis not present

## 2017-03-16 MED ORDER — TESTOSTERONE CYPIONATE 200 MG/ML IM SOLN
200.0000 mg | Freq: Once | INTRAMUSCULAR | Status: AC
  Administered 2017-03-16: 200 mg via INTRAMUSCULAR

## 2017-03-16 NOTE — Progress Notes (Signed)
Testosterone IM Injection  Due to Hypogonadism patient is present today for a Testosterone Injection.  Medication: Testosterone Cypionate Dose: 1ml Location: left upper outer buttocks Lot: [id number]  Exp:11/2018  Patient tolerated well, no complications were noted  Preformed by: C.Shekelia Boutin, CMA  Follow up: 2 weeks 

## 2017-03-30 ENCOUNTER — Ambulatory Visit (INDEPENDENT_AMBULATORY_CARE_PROVIDER_SITE_OTHER): Payer: BLUE CROSS/BLUE SHIELD

## 2017-03-30 DIAGNOSIS — E291 Testicular hypofunction: Secondary | ICD-10-CM | POA: Diagnosis not present

## 2017-03-30 MED ORDER — TESTOSTERONE CYPIONATE 200 MG/ML IM SOLN
200.0000 mg | Freq: Once | INTRAMUSCULAR | Status: AC
  Administered 2017-03-30: 200 mg via INTRAMUSCULAR

## 2017-03-30 NOTE — Progress Notes (Signed)
Testosterone IM Injection  Due to Hypogonadism patient is present today for a Testosterone Injection.  Medication: Testosterone Cypionate Dose: 87ml Location: left upper outer buttocks Lot: [id number]  Exp:11/2018  Patient tolerated well, no complications were noted  Preformed by: C.Corinna Capra, CMA  Follow up: 2 weeks

## 2017-04-05 ENCOUNTER — Ambulatory Visit: Payer: BLUE CROSS/BLUE SHIELD | Attending: Internal Medicine

## 2017-04-05 DIAGNOSIS — J449 Chronic obstructive pulmonary disease, unspecified: Secondary | ICD-10-CM

## 2017-04-05 MED ORDER — ALBUTEROL SULFATE (2.5 MG/3ML) 0.083% IN NEBU
2.5000 mg | INHALATION_SOLUTION | Freq: Once | RESPIRATORY_TRACT | Status: AC
  Administered 2017-04-05: 2.5 mg via RESPIRATORY_TRACT
  Filled 2017-04-05: qty 3

## 2017-04-07 ENCOUNTER — Ambulatory Visit (INDEPENDENT_AMBULATORY_CARE_PROVIDER_SITE_OTHER): Payer: BLUE CROSS/BLUE SHIELD | Admitting: Internal Medicine

## 2017-04-07 ENCOUNTER — Ambulatory Visit (INDEPENDENT_AMBULATORY_CARE_PROVIDER_SITE_OTHER): Payer: BLUE CROSS/BLUE SHIELD

## 2017-04-07 ENCOUNTER — Encounter: Payer: Self-pay | Admitting: Internal Medicine

## 2017-04-07 VITALS — BP 134/78 | HR 97 | Ht 72.0 in | Wt 191.0 lb

## 2017-04-07 DIAGNOSIS — J9611 Chronic respiratory failure with hypoxia: Secondary | ICD-10-CM | POA: Diagnosis not present

## 2017-04-07 DIAGNOSIS — E291 Testicular hypofunction: Secondary | ICD-10-CM | POA: Diagnosis not present

## 2017-04-07 MED ORDER — TESTOSTERONE CYPIONATE 200 MG/ML IM SOLN
200.0000 mg | Freq: Once | INTRAMUSCULAR | Status: AC
  Administered 2017-04-07: 200 mg via INTRAMUSCULAR

## 2017-04-07 MED ORDER — FLUTICASONE FUROATE-VILANTEROL 200-25 MCG/INH IN AEPB: 1.0000 | INHALATION_SPRAY | Freq: Every day | RESPIRATORY_TRACT | 0 refills | Status: DC

## 2017-04-07 NOTE — Progress Notes (Signed)
[place] [facility] Consultation      MRN# [id number] Paul Carpenter [birth year]   CC: Follow up COPD and wheezing   Brief History: Synopsis: Paul Carpenter first saw the [facility] pulmonary clinic in July of 2014 for COPD. Simple spirometry showed a ratio 62%, FEV1 of 2.41 L (60% predicted). He had smoked 2 packs of cigarettes daily since his teenage years and continues to smoke. He has had multiple sinus infections and 2 sinus surgeries in the past.. LUL lung lesion negative for malignancy based on ENB 06/2016.  HPI Patient presents today for follow-up visit of his COPD No signs of infection at this time Still smoking  ONO+for nocturnal hypoxia CT chest reviewed with patient-stable nodules no new opacities 04/2017 PFT's shows ratio 62 with Fev1 68% 2.6L with air trapping and hyperinflation  Current Outpatient Prescriptions:  .  albuterol (PROAIR HFA) 108 (90 Base) MCG/ACT inhaler, Inhale 2 puffs into the lungs every 6 (six) hours as needed for wheezing or shortness of breath., Disp: 1 Inhaler, Rfl: 3 .  aspirin EC 81 MG tablet, Take 81 mg by mouth daily. , Disp: , Rfl:  .  atorvastatin (LIPITOR) 40 MG tablet, Take 1 tablet by mouth daily., Disp: , Rfl:  .  cyclobenzaprine (FLEXERIL) 5 MG tablet, Take by mouth., Disp: , Rfl:  .  diazepam (VALIUM) 5 MG tablet, Take 5 mg by mouth at bedtime as needed for anxiety. , Disp: , Rfl:  .  escitalopram (LEXAPRO) 10 MG tablet, Take by mouth., Disp: , Rfl:  .  fluticasone (FLONASE) 50 MCG/ACT nasal spray, Place 1 spray into both nostrils daily., Disp: , Rfl:  .  fluticasone furoate-vilanterol (BREO ELLIPTA) 200-25 MCG/INH AEPB, Inhale 1 puff into the lungs daily., Disp: 60 each, Rfl: 5 .  ipratropium-albuterol (DUONEB) 0.5-2.5 (3) MG/3ML SOLN, Take 3 mLs by nebulization every 6 (six) hours., Disp: 360 mL, Rfl: 5 .  metoprolol tartrate (LOPRESSOR) 25 MG tablet, Take 25 mg by mouth 2 (two) times daily., Disp: , Rfl:  .   oxyCODONE (OXY IR/ROXICODONE) 5 MG immediate release tablet, Take 1 tablet (5 mg total) by mouth every 6 (six) hours as needed for severe pain., Disp: 12 tablet, Rfl: 0 .  OXYGEN, Inhale into the lungs. 2 units at night, Disp: , Rfl:  .  pantoprazole (PROTONIX) 40 MG tablet, Take 40 mg by mouth daily., Disp: , Rfl: 3 .  potassium chloride (KLOR-CON) 20 MEQ packet, Take 20 mEq by mouth daily. , Disp: , Rfl:  .  testosterone cypionate (DEPOTESTOSTERONE CYPIONATE) 200 MG/ML injection, Inject 1 mL (200 mg total) into the muscle every 14 (fourteen) days., Disp: 10 mL, Rfl: 0   Review of Systems  Constitutional: Positive for malaise/fatigue. Negative for chills and fever.  Eyes: Negative for blurred vision.  Respiratory: Positive for shortness of breath. Negative for cough, hemoptysis, sputum production and wheezing.   Cardiovascular: Negative for chest pain.  Gastrointestinal: Negative for heartburn, nausea and vomiting.  Skin: Negative for rash.   OTHER ROS NEGATIVE   Allergies:  Lisinopril and Varenicline  Physical Examination:  BP 134/78 (BP Location: Left Arm, Cuff Size: Normal)   Pulse 97   Ht 6' (1.829 m)   Wt 191 lb (86.6 kg)   SpO2 98%   BMI 25.90 kg/m   General Appearance: No distress  HEENT: PERRLA, no ptosis, no other lesions noticed Pulmonary:normal breath sounds., diaphragmatic excursion normal.+wheezing, No rales   Cardiovascular:  Normal S1,S2.  No m/r/g.  Abdomen:Exam: Benign, Soft, non-tender, No masses  Skin:   warm, no rashes, no ecchymosis  Extremities: normal, no cyanosis, clubbing, warm with normal capillary refill.     I Assessment and Plan: 61 year old male with Moderate COPD Gold stage B with chronic hypoxic resp failure h/o left upper lobe lung lesion   COPD (chronic obstructive pulmonary disease) Still smoking -smoking cessation strongly advised Cont with Breo 200/25 Not taking spiriva   Cigarette smoker Tobacco Cessation - Counseling  regarding benefits of smoking cessation strategies was provided for more than 3 min. - Educated that at this time smoking- cessation represents the single most important step that patient can take to enhance the length and quality of live.   Mass of upper lobe of left lung ENB of LUL lesion negative for malignant cell in 06/2016. Repeat CT chest in 08/2016 with RUL small scarring lesion, this is new.  Had another CTA Chest 09/09/16 for severe dyspnea - no PE, but LUL PNA CT chest 03/2017- -Apical right upper lobe 5 mm pulmonary nodule stable back to 03/14/2015 - 6 mm apical right upper lobe pulmonary nodule stable since at least 03/22/2016.  -tree-in-bud type opacity in the anterior right middle lobe is stable since 08/23/2016 -Apical left upper lobe 9 x 5 mm  nodule is stable since at least 03/22/2016  - clinical monitoring at this time-follow up CT chest in 6-12 months  CHronic Hypoxic resp failure -patient will need 2L [place] oxygen at night  Patient satisfied with Plan of action and management. All questions answered I have explained that his lung function will decline with ongoing  Tobacco abuse  Corrin Parker, M.D.  [facility]  Medical Director [place] Director [facility] Cardio-Pulmonary Department

## 2017-04-07 NOTE — Patient Instructions (Signed)
Continue BREo 200 Albuterol as needed STOP SMOKING!!!!

## 2017-04-07 NOTE — Progress Notes (Signed)
Testosterone IM Injection  Due to Hypogonadism patient is present today for a Testosterone Injection.  Medication: Testosterone Cypionate Dose: 6ml Location: right upper outer buttocks Lot: [id number]  Exp:11/2018  Patient tolerated well, no complications were noted  Performed by: C. Corinna Capra, CMA   Follow up: 2 weeks

## 2017-04-08 ENCOUNTER — Ambulatory Visit: Payer: BLUE CROSS/BLUE SHIELD

## 2017-04-09 ENCOUNTER — Ambulatory Visit
Admission: EM | Admit: 2017-04-09 | Discharge: 2017-04-09 | Disposition: A | Discharge status: Home / Self Care | Payer: BLUE CROSS/BLUE SHIELD | Attending: Emergency Medicine | Admitting: Emergency Medicine

## 2017-04-09 ENCOUNTER — Encounter: Payer: Self-pay | Admitting: Gynecology

## 2017-04-09 DIAGNOSIS — R21 Rash and other nonspecific skin eruption: Secondary | ICD-10-CM | POA: Diagnosis not present

## 2017-04-09 MED ORDER — VALACYCLOVIR HCL 1 G PO TABS: 1000.0000 mg | ORAL_TABLET | Freq: Three times a day (TID) | ORAL | 0 refills | Status: DC

## 2017-04-09 NOTE — Discharge Instructions (Signed)
This appears to be early shingles. It is okay to go to the beach, but keep the rash covered at all time. It is contagious when it has blisters and they rupture. Try to limit your contact with your pregnant family member and the young children. Try the hydrocortisone cream. If this starts getting worse, then start the Valtrex immediately.

## 2017-04-09 NOTE — ED Triage Notes (Signed)
Patient c/o rash at abdomen and back notice x this am.

## 2017-04-09 NOTE — ED Provider Notes (Signed)
HPI  SUBJECTIVE:  Paul Carpenter is a 61 y.o. male who presents with an erythematous rash on his right back and torso starting today. States he noticed it this morning when getting out of the shower. He reports stinging pain, pins and needle sensation. There are no aggravating or alleviating factors. He has not tried anything for this. No blisters, itching, burning. No preceding paresthesias. No new lotions, soaps, detergents, meds, fevers, bodyaches. No flulike symptoms. No contacts with similar rash. No exposure to poison ivy. Patient has an extensive past medical history including COPD, hypertension, coronary disease, chickenpox, polycythemia vera. No history of shingles, kidney disease, antiplatelet anticoagulant use other than baby aspirin daily. He continues to smoke. No history of diabetes. PMD: Kirk Ruths, MD   Past Medical History:  Diagnosis Date  . Anginal pain ([place])   . Anxiety   . Chicken pox   . Complication of anesthesia    o2 dropped after neck fusion  . COPD (chronic obstructive pulmonary disease) ([place])   . Coronary artery disease   . Cough    chronic  clear phlegm  . Dysrhythmia   . GERD (gastroesophageal reflux disease)    h/o reflux/ hoarsness  . Hematochezia   . Hemorrhoids   . Hoarseness   . Hypertension   . Migraines   . OSA (obstructive sleep apnea)    has CPAP but does not use  . Personal history of tobacco use, presenting hazards to health 03/05/2016  . Pneumonia    5/17  . Raynaud disease   . Raynaud's disease   . Rotator cuff tear    on right  . Shortness of breath dyspnea   . Sleep apnea   . Ulcer (traumatic) of oral mucosa     Past Surgical History:  Procedure Laterality Date  . BACK SURGERY     cervical fusion x 2  . CARDIAC CATHETERIZATION    . CERVICAL DISCECTOMY    . COLONOSCOPY    . COLONOSCOPY N/A 07/25/2015   Procedure: COLONOSCOPY;  Surgeon: Lollie Sails, MD;  Location: [facility] ENDOSCOPY;  Service: Endoscopy;   Laterality: N/A;  . ELECTROMAGNETIC NAVIGATION BROCHOSCOPY Left 06/28/2016   Procedure: ELECTROMAGNETIC NAVIGATION BRONCHOSCOPY;  Surgeon: Vilinda Boehringer, MD;  Location: [facility] ORS;  Service: Cardiopulmonary;  Laterality: Left;  . ESOPHAGOGASTRODUODENOSCOPY N/A 07/25/2015   Procedure: ESOPHAGOGASTRODUODENOSCOPY (EGD);  Surgeon: Lollie Sails, MD;  Location: [facility] ENDOSCOPY;  Service: Endoscopy;  Laterality: N/A;  . NASAL SINUS SURGERY     x 2   . NECK SURGERY    . rotator cuff surgery Right    07/2016  . SEPTOPLASTY    . SKIN GRAFT      Family History  Problem Relation Age of Onset  . Heart disease Father   . Prostate cancer Father   . Heart disease Paternal Grandmother   . Heart attack Maternal Grandfather 52  . Kidney cancer Neg Hx   . Bladder Cancer Neg Hx     Social History  Substance Use Topics  . Smoking status: Current Every Day Smoker    Packs/day: 2.00    Years: 45.00    Types: Cigarettes  . Smokeless tobacco: Never Used  . Alcohol use 1.2 oz/week    2 Standard drinks or equivalent per week    No current facility-administered medications for this encounter.   Current Outpatient Prescriptions:  .  albuterol (PROAIR HFA) 108 (90 Base) MCG/ACT inhaler, Inhale 2 puffs into the lungs every 6 (six) hours  as needed for wheezing or shortness of breath., Disp: 1 Inhaler, Rfl: 3 .  aspirin EC 81 MG tablet, Take 81 mg by mouth daily. , Disp: , Rfl:  .  atorvastatin (LIPITOR) 40 MG tablet, Take 1 tablet by mouth daily., Disp: , Rfl:  .  cyclobenzaprine (FLEXERIL) 5 MG tablet, Take by mouth., Disp: , Rfl:  .  diazepam (VALIUM) 5 MG tablet, Take 5 mg by mouth at bedtime as needed for anxiety. , Disp: , Rfl:  .  escitalopram (LEXAPRO) 10 MG tablet, Take by mouth., Disp: , Rfl:  .  fluticasone (FLONASE) 50 MCG/ACT nasal spray, Place 1 spray into both nostrils daily., Disp: , Rfl:  .  fluticasone furoate-vilanterol (BREO ELLIPTA) 200-25 MCG/INH AEPB, Inhale 1 puff into the lungs  daily., Disp: 60 each, Rfl: 0 .  ipratropium-albuterol (DUONEB) 0.5-2.5 (3) MG/3ML SOLN, Take 3 mLs by nebulization every 6 (six) hours., Disp: 360 mL, Rfl: 5 .  metoprolol tartrate (LOPRESSOR) 25 MG tablet, Take 25 mg by mouth 2 (two) times daily., Disp: , Rfl:  .  oxyCODONE (OXY IR/ROXICODONE) 5 MG immediate release tablet, Take 1 tablet (5 mg total) by mouth every 6 (six) hours as needed for severe pain., Disp: 12 tablet, Rfl: 0 .  OXYGEN, Inhale into the lungs. 2 units at night, Disp: , Rfl:  .  pantoprazole (PROTONIX) 40 MG tablet, Take 40 mg by mouth daily., Disp: , Rfl: 3 .  potassium chloride (KLOR-CON) 20 MEQ packet, Take 20 mEq by mouth daily. , Disp: , Rfl:  .  testosterone cypionate (DEPOTESTOSTERONE CYPIONATE) 200 MG/ML injection, Inject 1 mL (200 mg total) into the muscle every 14 (fourteen) days., Disp: 10 mL, Rfl: 0 .  valACYclovir (VALTREX) 1000 MG tablet, Take 1 tablet (1,000 mg total) by mouth 3 (three) times daily. X 7 days, Disp: 21 tablet, Rfl: 0  Allergies  Allergen Reactions  . Lisinopril Other (See Comments)  . Varenicline Other (See Comments)     ROS  As noted in HPI.   Physical Exam  BP 120/82 (BP Location: Left Arm)   Pulse 95   Temp 98.3 F (36.8 C) (Oral)   Resp 16   Wt 191 lb (86.6 kg)   SpO2 98%   BMI 25.90 kg/m   Constitutional: Well developed, well nourished, no acute distress Eyes: PERRL, EOMI, conjunctiva normal bilaterally HENT: Normocephalic, atraumatic,mucus membranes moist Respiratory: Clear to auscultation bilaterally, no rales, no wheezing, no rhonchi Cardiovascular: Normal rate and rhythm, no murmurs, no gallops, no rubs GI: Soft, nondistended, normal bowel sounds, nontender, no rebound, no guarding Back: no CVAT skin: Blanchable, non-tender erythematous raised rash in a dermatomal distribution on the right back and abdomen. No vesicles, crusting see pictures.       Musculoskeletal: No edema, no tenderness, no  deformities Neurologic: Alert & oriented x 3, CN II-XII grossly intact, no motor deficits, sensation grossly intact Psychiatric: Speech and behavior appropriate   ED Course   Medications - No data to display  No orders of the defined types were placed in this encounter.  No results found for this or any previous visit (from the past 24 hour(s)). No results found.  ED Clinical Impression  Rash  ED Assessment/Plan  This appears to be early shingles, but it is difficult to tell because the rash is only several hours old. We'll send home with a prescription of Valtrex just in case it starts to get worse or blister up. He is to try topical OTC  hydrocortisone on this. He is already taking Tylenol and ibuprofen  For back pain- he may continue this for pain. Follow-up with PMD as needed. Discussed MDM, plan and followup with patient. Pt agrees with plan.   Meds ordered this encounter  Medications  . valACYclovir (VALTREX) 1000 MG tablet    Sig: Take 1 tablet (1,000 mg total) by mouth 3 (three) times daily. X 7 days    Dispense:  21 tablet    Refill:  0    *This clinic note was created using [employer]. Therefore, there may be occasional mistakes despite careful proofreading.  ?   Melynda Ripple, MD 04/09/17 1237

## 2017-04-12 ENCOUNTER — Ambulatory Visit: Payer: BLUE CROSS/BLUE SHIELD

## 2017-04-18 ENCOUNTER — Ambulatory Visit: Payer: BLUE CROSS/BLUE SHIELD | Admitting: Internal Medicine

## 2017-04-21 ENCOUNTER — Ambulatory Visit (INDEPENDENT_AMBULATORY_CARE_PROVIDER_SITE_OTHER): Payer: BLUE CROSS/BLUE SHIELD

## 2017-04-21 DIAGNOSIS — E291 Testicular hypofunction: Secondary | ICD-10-CM

## 2017-04-21 MED ORDER — TESTOSTERONE CYPIONATE 200 MG/ML IM SOLN
200.0000 mg | Freq: Once | INTRAMUSCULAR | Status: AC
  Administered 2017-04-21: 200 mg via INTRAMUSCULAR

## 2017-04-21 NOTE — Progress Notes (Signed)
Testosterone IM Injection  Due to Hypogonadism patient is present today for a Testosterone Injection.  Medication: Testosterone Cypionate Dose: 1 ml Location: left upper outer buttocks Lot: [id number] Exp:11/2018  Patient tolerated well, no complications were noted  Performed by: C. Corinna Capra, CMA  Follow up: 2 weeks

## 2017-05-05 ENCOUNTER — Ambulatory Visit (INDEPENDENT_AMBULATORY_CARE_PROVIDER_SITE_OTHER): Payer: BLUE CROSS/BLUE SHIELD | Admitting: Family Medicine

## 2017-05-05 ENCOUNTER — Other Ambulatory Visit: Payer: Self-pay | Admitting: Family Medicine

## 2017-05-05 DIAGNOSIS — E291 Testicular hypofunction: Secondary | ICD-10-CM | POA: Diagnosis not present

## 2017-05-05 MED ORDER — TESTOSTERONE CYPIONATE 200 MG/ML IM SOLN
200.0000 mg | Freq: Once | INTRAMUSCULAR | Status: AC
  Administered 2017-05-05: 200 mg via INTRAMUSCULAR

## 2017-05-05 NOTE — Telephone Encounter (Signed)
Patient has only 1 more dose of Testosterone. He would like a refill

## 2017-05-05 NOTE — Progress Notes (Signed)
Testosterone IM Injection  Due to Hypogonadism patient is present today for a Testosterone Injection.  Medication: Testosterone Cypionate Dose: 1ML Location: right upper outer buttocks Lot: [id number] Exp:11/2018  Patient tolerated well, no complications were noted  Preformed by: Elberta Leatherwood, CMA

## 2017-05-06 MED ORDER — TESTOSTERONE CYPIONATE 200 MG/ML IM SOLN: 200.0000 mg | INTRAMUSCULAR | 0 refills | Status: DC

## 2017-05-09 ENCOUNTER — Other Ambulatory Visit: Payer: BLUE CROSS/BLUE SHIELD

## 2017-05-09 DIAGNOSIS — E291 Testicular hypofunction: Secondary | ICD-10-CM

## 2017-05-10 LAB — TESTOSTERONE: TESTOSTERONE: 1419 ng/dL — AB (ref 264–916)

## 2017-05-11 ENCOUNTER — Telehealth: Payer: Self-pay | Admitting: *Deleted

## 2017-05-11 DIAGNOSIS — E291 Testicular hypofunction: Secondary | ICD-10-CM

## 2017-05-11 NOTE — Telephone Encounter (Signed)
Patient labs came back and Testosterone was abnormal at 1419. Results were forwarded to Dr. Pilar Jarvis whom verbally told me to call patient and have a CBC drawn today, and to drop injection dose to 0.11ml every two weeks and to have labs checked 4 days after his next injection.  Tried to call patient no answer, no voicemail at home number and [facility] mobile number for patient to call office back about recent labs.

## 2017-05-11 NOTE — Telephone Encounter (Signed)
Patient called the office back and I gave results and explained all of Dr. Bethena Midget verbal orders. Patient states he will come in tomorrow for CBC and understands about dose and labs after next injection. Orders placed. Patient states he has had to do therapeutic phlebotomy before. I let him know we will wait to see what his cbc shows. Patient ok with plan.

## 2017-05-12 ENCOUNTER — Other Ambulatory Visit: Payer: BLUE CROSS/BLUE SHIELD

## 2017-05-12 DIAGNOSIS — E291 Testicular hypofunction: Secondary | ICD-10-CM

## 2017-05-13 LAB — CBC WITH DIFFERENTIAL/PLATELET
Basophils Absolute: 0 10*3/uL (ref 0.0–0.2)
Basos: 0 %
EOS (ABSOLUTE): 0.2 10*3/uL (ref 0.0–0.4)
Eos: 2 %
HEMOGLOBIN: 16.6 g/dL (ref 13.0–17.7)
Hematocrit: 47.5 % (ref 37.5–51.0)
Immature Grans (Abs): 0 10*3/uL (ref 0.0–0.1)
Immature Granulocytes: 0 %
LYMPHS ABS: 1.5 10*3/uL (ref 0.7–3.1)
Lymphs: 14 %
MCH: 33.1 pg — AB (ref 26.6–33.0)
MCHC: 34.9 g/dL (ref 31.5–35.7)
MCV: 95 fL (ref 79–97)
MONOS ABS: 0.7 10*3/uL (ref 0.1–0.9)
Monocytes: 7 %
NEUTROS ABS: 7.8 10*3/uL — AB (ref 1.4–7.0)
Neutrophils: 77 %
Platelets: 248 10*3/uL (ref 150–379)
RBC: 5.01 x10E6/uL (ref 4.14–5.80)
RDW: 15.3 % (ref 12.3–15.4)
WBC: 10.2 10*3/uL (ref 3.4–10.8)

## 2017-05-16 ENCOUNTER — Telehealth: Payer: Self-pay | Admitting: Urology

## 2017-05-16 NOTE — Telephone Encounter (Signed)
Please tell Paul Carpenter that his CBC is normal.  His testosterone dose is decreased to 100 mg/IM every two weeks and we will recheck his testosterone after his next injection.

## 2017-05-18 NOTE — Telephone Encounter (Signed)
Gave pt results and instructions per Shannon's previous note.  Patient verbalized understanding.

## 2017-05-19 ENCOUNTER — Ambulatory Visit (INDEPENDENT_AMBULATORY_CARE_PROVIDER_SITE_OTHER): Payer: BLUE CROSS/BLUE SHIELD

## 2017-05-19 DIAGNOSIS — E291 Testicular hypofunction: Secondary | ICD-10-CM

## 2017-05-19 MED ORDER — TESTOSTERONE CYPIONATE 200 MG/ML IM SOLN
200.0000 mg | Freq: Once | INTRAMUSCULAR | Status: AC
  Administered 2017-05-19: 200 mg via INTRAMUSCULAR

## 2017-05-19 NOTE — Progress Notes (Signed)
Testosterone IM Injection  Due to Hypogonadism patient is present today for a Testosterone Injection.  Medication: Testosterone Cypionate Dose: 0.14ml Location: left upper outer buttocks Lot: [id number] Exp:11/2018  Patient tolerated well, no complications were noted  Preformed by: C. Corinna Capra, CMA  Follow up: 4 days for labs

## 2017-05-23 ENCOUNTER — Other Ambulatory Visit: Payer: BLUE CROSS/BLUE SHIELD

## 2017-05-23 DIAGNOSIS — E291 Testicular hypofunction: Secondary | ICD-10-CM

## 2017-05-24 ENCOUNTER — Telehealth: Payer: Self-pay

## 2017-05-24 DIAGNOSIS — E291 Testicular hypofunction: Secondary | ICD-10-CM

## 2017-05-24 DIAGNOSIS — N401 Enlarged prostate with lower urinary tract symptoms: Secondary | ICD-10-CM

## 2017-05-24 LAB — TESTOSTERONE: TESTOSTERONE: 681 ng/dL (ref 264–916)

## 2017-05-24 NOTE — Telephone Encounter (Signed)
Spoke to patient. Gave results and instructions. Placed lab orders. Transferred patient to front office to be scheduled for OV in Sept.

## 2017-05-24 NOTE — Telephone Encounter (Signed)
-----   Message from Nori Riis, PA-C sent at 05/24/2017  7:56 AM EDT ----- Please notify Paul Carpenter that his testosterone has returned to therapeutic levels. We will continue at his present dose of 0.5 mL every 2 weeks.  I will need to see him for an office visit in September.  He will need a testosterone level, PSA, hemoglobin and hematocrit drawn 1 week after his injection and prior to his appointment.

## 2017-06-02 ENCOUNTER — Ambulatory Visit: Payer: BLUE CROSS/BLUE SHIELD

## 2017-06-03 ENCOUNTER — Ambulatory Visit (INDEPENDENT_AMBULATORY_CARE_PROVIDER_SITE_OTHER): Payer: BLUE CROSS/BLUE SHIELD

## 2017-06-03 DIAGNOSIS — E291 Testicular hypofunction: Secondary | ICD-10-CM

## 2017-06-03 MED ORDER — TESTOSTERONE CYPIONATE 200 MG/ML IM SOLN
200.0000 mg | Freq: Once | INTRAMUSCULAR | Status: AC
  Administered 2017-06-03: 200 mg via INTRAMUSCULAR

## 2017-06-03 NOTE — Progress Notes (Signed)
Testosterone IM Injection  Due to Hypogonadism patient is present today for a Testosterone Injection.  Medication: Testosterone Cypionate Dose: 0.74mL Location: right upper outer buttocks Lot: [id number] Exp:02/20  Patient tolerated well, no complications were noted  Preformed by: Toniann Fail, LPN   Follow up: 2 weeks

## 2017-06-06 ENCOUNTER — Other Ambulatory Visit: Payer: BLUE CROSS/BLUE SHIELD

## 2017-06-16 ENCOUNTER — Ambulatory Visit (INDEPENDENT_AMBULATORY_CARE_PROVIDER_SITE_OTHER): Payer: BLUE CROSS/BLUE SHIELD

## 2017-06-16 DIAGNOSIS — E291 Testicular hypofunction: Secondary | ICD-10-CM

## 2017-06-16 MED ORDER — TESTOSTERONE CYPIONATE 200 MG/ML IM SOLN
200.0000 mg | Freq: Once | INTRAMUSCULAR | Status: AC
  Administered 2017-06-16: 200 mg via INTRAMUSCULAR

## 2017-06-16 NOTE — Progress Notes (Signed)
Testosterone IM Injection  Due to Hypogonadism patient is present today for a Testosterone Injection.  Medication: Testosterone Cypionate Dose: 0.96mL Location: right upper outer buttocks Lot: [id number] Exp:11/2018  Patient tolerated well, no complications were noted  Preformed by: Toniann Fail, LPN   Follow up: 2 weeks

## 2017-06-30 ENCOUNTER — Ambulatory Visit (INDEPENDENT_AMBULATORY_CARE_PROVIDER_SITE_OTHER): Payer: BLUE CROSS/BLUE SHIELD

## 2017-06-30 DIAGNOSIS — E291 Testicular hypofunction: Secondary | ICD-10-CM | POA: Diagnosis not present

## 2017-06-30 MED ORDER — TESTOSTERONE CYPIONATE 200 MG/ML IM SOLN
200.0000 mg | Freq: Once | INTRAMUSCULAR | Status: AC
  Administered 2017-06-30: 200 mg via INTRAMUSCULAR

## 2017-06-30 NOTE — Progress Notes (Signed)
Testosterone IM Injection  Due to Hypogonadism patient is present today for a Testosterone Injection.  Medication: Testosterone Cypionate Dose: 0.54mL Location: left upper outer buttocks Lot: [id number] Exp:12/2018  Patient tolerated well, no complications were noted  Preformed by: Toniann Fail, LPN   Follow up: Pt will have labs next week, injection in 2 weeks, & see Larene Beach in 3 weeks.

## 2017-07-08 ENCOUNTER — Other Ambulatory Visit: Payer: BLUE CROSS/BLUE SHIELD

## 2017-07-08 DIAGNOSIS — E291 Testicular hypofunction: Secondary | ICD-10-CM

## 2017-07-09 LAB — CBC WITH DIFFERENTIAL/PLATELET
BASOS ABS: 0 10*3/uL (ref 0.0–0.2)
Basos: 0 %
EOS (ABSOLUTE): 0.1 10*3/uL (ref 0.0–0.4)
Eos: 1 %
Hematocrit: 49.3 % (ref 37.5–51.0)
Hemoglobin: 17.9 g/dL — ABNORMAL HIGH (ref 13.0–17.7)
Immature Grans (Abs): 0 10*3/uL (ref 0.0–0.1)
Immature Granulocytes: 0 %
Lymphocytes Absolute: 1.8 10*3/uL (ref 0.7–3.1)
Lymphs: 21 %
MCH: 33.6 pg — ABNORMAL HIGH (ref 26.6–33.0)
MCHC: 36.3 g/dL — AB (ref 31.5–35.7)
MCV: 93 fL (ref 79–97)
Monocytes Absolute: 0.6 10*3/uL (ref 0.1–0.9)
Monocytes: 8 %
Neutrophils Absolute: 6 10*3/uL (ref 1.4–7.0)
Neutrophils: 70 %
PLATELETS: 228 10*3/uL (ref 150–379)
RBC: 5.32 x10E6/uL (ref 4.14–5.80)
RDW: 13.4 % (ref 12.3–15.4)
WBC: 8.6 10*3/uL (ref 3.4–10.8)

## 2017-07-09 LAB — PSA: PROSTATE SPECIFIC AG, SERUM: 1.1 ng/mL (ref 0.0–4.0)

## 2017-07-12 ENCOUNTER — Other Ambulatory Visit: Payer: BLUE CROSS/BLUE SHIELD

## 2017-07-12 LAB — TESTOSTERONE

## 2017-07-13 LAB — TESTOSTERONE: Testosterone: 653 ng/dL (ref 264–916)

## 2017-07-13 LAB — SPECIMEN STATUS REPORT

## 2017-07-13 NOTE — Progress Notes (Signed)
07/14/2017 11:18 AM   [name] 1956-08-26 [id number]  Referring provider: Kirk Ruths, MD [facility] [place], [place] [address]  Chief Complaint  Patient presents with  . Hypogonadism    3 month follow up    HPI: Patient is a 61 year old Caucasian male with testosterone deficiency, erectile dysfunction and BPH with LUT S who presents today for 6 month follow-up  Testosterone deficiency Patient is experiencing a decrease in libido, a lack of energy, a decrease in strength, a loss in height, A decreased enjoyment of life,  erections being less strong, a recent deterioration in an ability to play sports fine asleep after dinner and a recent deterioration in his work performance. This is indicated by his responses to the ADAM questionnaire.  He is still having spontaneous erections at night.   He does have sleep apnea and is not using CPAP.  He is currently managing his testosterone deficiency with testosterone cypionate 200 mg, 1.5 IM q 2 weeks.  He has had episodes of elevated hematocrit and had undergone therapeutic phlebotomies in the past.  His recent serum testosterone level was 653 ng/dL on 07/08/2017. His hemoglobin is 17.9 and his hematocrit is 49.3%.        Androgen Deficiency in the Aging Male    [place] Name 07/14/17 1000         Androgen Deficiency in the Aging Male   Do you have a decrease in libido (sex drive) Yes     Do you have lack of energy Yes     Do you have a decrease in strength and/or endurance Yes     Have you lost height Yes     Have you noticed a decreased "enjoyment of life" Yes     Are you sad and/or grumpy No     Are your erections less strong Yes     Have you noticed a recent deterioration in your ability to play sports Yes     Are you falling asleep after dinner Yes     Has there been a recent deterioration in your work performance Yes       Erectile dysfunction His SHIM score is 15, which is mild  to moderate ED.   He has been having difficulty with erections for over three years    His previous SHIM score was 19.  His major complaint is maintaining  His libido is diminished   His risk factors for ED are age, BPH, testosterone deficiency, HLD, sleep apnea, CAD, smoking, antidepressants and pain medication.  He denies any painful erections, but he has curvature with his erections.   It is not a greater than 30 degree curvature.  He is still having spontaneous erections.     SHIM    Row Name 07/14/17 1030         SHIM: Over the last 6 months:   How do you rate your confidence that you could get and keep an erection? Low     When you had erections with sexual stimulation, how often were your erections hard enough for penetration (entering your partner)? Sometimes (about half the time)     During sexual intercourse, how often were you able to maintain your erection after you had penetrated (entered) your partner? Most Times (much more than half the time)     During sexual intercourse, how difficult was it to maintain your erection to completion of intercourse? Difficult  When you attempted sexual intercourse, how often was it satisfactory for you? Sometimes (about half the time)       SHIM Total Score   SHIM 15        Score: 1-7 Severe ED 8-11 Moderate ED 12-16 Mild-Moderate ED 17-21 Mild ED 22-25 No ED   BPH WITH LUTS His IPSS score today is 26, which is severe lower urinary tract symptomatology. He is unhappy with his quality life due to his urinary symptoms.  His PVR is 159 mL   His UA is negative.  His previous I PSS score was 14/2.  His major complaints today are frequency, urgency, nocturia and incontinence.  These symptoms occur mostly in the evening.  He has had these symptoms for several years.  He denies any dysuria, hematuria or suprapubic pain.  He also denies any recent fevers, chills, nausea or vomiting.  His father has been diagnosed with non fatal prostate cancer.         IPSS    Row Name 07/14/17 1000         International Prostate Symptom Score   How often have you had the sensation of not emptying your bladder? More than half the time     How often have you had to urinate less than every two hours? Almost always     How often have you found you stopped and started again several times when you urinated? About half the time     How often have you found it difficult to postpone urination? Almost always     How often have you had a weak urinary stream? About half the time     How often have you had to strain to start urination? Less than 1 in 5 times     How many times did you typically get up at night to urinate? 5 Times     Total IPSS Score 26       Quality of Life due to urinary symptoms   If you were to spend the rest of your life with your urinary condition just the way it is now how would you feel about that? Unhappy        Score:  1-7 Mild 8-19 Moderate 20-35 Severe  PMH: Past Medical History:  Diagnosis Date  . Anginal pain ([place])   . Anxiety   . Chicken pox   . Complication of anesthesia    o2 dropped after neck fusion  . COPD (chronic obstructive pulmonary disease) ([place])   . Coronary artery disease   . Cough    chronic  clear phlegm  . Dysrhythmia   . GERD (gastroesophageal reflux disease)    h/o reflux/ hoarsness  . Hematochezia   . Hemorrhoids   . Hoarseness   . Hypertension   . Migraines   . OSA (obstructive sleep apnea)    has CPAP but does not use  . Personal history of tobacco use, presenting hazards to health 03/05/2016  . Pneumonia    5/17  . Raynaud disease   . Raynaud's disease   . Rotator cuff tear    on right  . Shortness of breath dyspnea   . Sleep apnea   . Ulcer (traumatic) of oral mucosa     Surgical History: Past Surgical History:  Procedure Laterality Date  . BACK SURGERY     cervical fusion x 2  . CARDIAC CATHETERIZATION    . CERVICAL DISCECTOMY     x 2  . COLONOSCOPY    .  COLONOSCOPY  N/A 07/25/2015   Procedure: COLONOSCOPY;  Surgeon: Lollie Sails, MD;  Location: [facility] ENDOSCOPY;  Service: Endoscopy;  Laterality: N/A;  . ELECTROMAGNETIC NAVIGATION BROCHOSCOPY Left 06/28/2016   Procedure: ELECTROMAGNETIC NAVIGATION BRONCHOSCOPY;  Surgeon: Vilinda Boehringer, MD;  Location: [facility] ORS;  Service: Cardiopulmonary;  Laterality: Left;  . ESOPHAGOGASTRODUODENOSCOPY N/A 07/25/2015   Procedure: ESOPHAGOGASTRODUODENOSCOPY (EGD);  Surgeon: Lollie Sails, MD;  Location: [facility] ENDOSCOPY;  Service: Endoscopy;  Laterality: N/A;  . NASAL SINUS SURGERY     x 2   . rotator cuff surgery Right    07/2016  . SEPTOPLASTY    . SKIN GRAFT      Home Medications:  Allergies as of 07/14/2017      Reactions   Lisinopril Other (See Comments)   Varenicline Other (See Comments)      Medication List       Accurate as of 07/14/17 11:18 AM. Always use your most recent med list.          albuterol 108 (90 Base) MCG/ACT inhaler Commonly known as:  PROAIR HFA Inhale 2 puffs into the lungs every 6 (six) hours as needed for wheezing or shortness of breath.   aspirin EC 81 MG tablet Take 81 mg by mouth daily.   atorvastatin 40 MG tablet Commonly known as:  LIPITOR Take 1 tablet by mouth daily.   cyclobenzaprine 5 MG tablet Commonly known as:  FLEXERIL Take by mouth.   diazepam 5 MG tablet Commonly known as:  VALIUM Take 5 mg by mouth at bedtime as needed for anxiety.   escitalopram 10 MG tablet Commonly known as:  LEXAPRO Take by mouth.   finasteride 5 MG tablet Commonly known as:  PROSCAR Take 1 tablet (5 mg total) by mouth daily.   fluticasone 50 MCG/ACT nasal spray Commonly known as:  FLONASE Place 1 spray into both nostrils daily.   fluticasone furoate-vilanterol 200-25 MCG/INH Aepb Commonly known as:  BREO ELLIPTA Inhale 1 puff into the lungs daily.   ipratropium-albuterol 0.5-2.5 (3) MG/3ML Soln Commonly known as:  DUONEB Take 3 mLs by nebulization every 6 (six)  hours.   metoprolol tartrate 25 MG tablet Commonly known as:  LOPRESSOR Take 25 mg by mouth 2 (two) times daily.   nitroGLYCERIN 0.4 MG SL tablet Commonly known as:  NITROSTAT Place under the tongue.   oxyCODONE 5 MG immediate release tablet Commonly known as:  Oxy IR/ROXICODONE Take 1 tablet (5 mg total) by mouth every 6 (six) hours as needed for severe pain.   OXYGEN Inhale into the lungs. 2 units at night   pantoprazole 40 MG tablet Commonly known as:  PROTONIX Take 40 mg by mouth daily.   polyethylene glycol powder powder Commonly known as:  GLYCOLAX/MIRALAX AS DIRECTED FOR COLONIC PREP.   potassium chloride 20 MEQ packet Commonly known as:  KLOR-CON Take 20 mEq by mouth daily.   testosterone cypionate 200 MG/ML injection Commonly known as:  DEPOTESTOSTERONE CYPIONATE Inject 1 mL (200 mg total) into the muscle every 14 (fourteen) days.   valACYclovir 1000 MG tablet Commonly known as:  VALTREX Take 1 tablet (1,000 mg total) by mouth 3 (three) times daily. X 7 days            Discharge Care Instructions        Start     Ordered   07/14/17 0000  Urinalysis, Complete     07/14/17 1031   07/14/17 0000  BLADDER SCAN AMB NON-IMAGING  07/14/17 1058   07/14/17 0000  finasteride (PROSCAR) 5 MG tablet  Daily    Question:  Supervising Provider  Answer:  Hollice Espy   07/14/17 1118      Allergies:  Allergies  Allergen Reactions  . Lisinopril Other (See Comments)  . Varenicline Other (See Comments)    Family History: Family History  Problem Relation Age of Onset  . Heart disease Father   . Prostate cancer Father   . Heart disease Paternal Grandmother   . Heart attack Maternal Grandfather 52  . Kidney cancer Neg Hx   . Bladder Cancer Neg Hx     Social History:  reports that he has been smoking Cigarettes.  He has a 90.00 pack-year smoking history. He has never used smokeless tobacco. He reports that he drinks about 1.2 oz of alcohol per week . He  reports that he does not use drugs.  ROS: UROLOGY Frequent Urination?: Yes Hard to postpone urination?: Yes Burning/pain with urination?: No Get up at night to urinate?: Yes Leakage of urine?: Yes Urine stream starts and stops?: No Trouble starting stream?: No Do you have to strain to urinate?: No Blood in urine?: No Urinary tract infection?: No Sexually transmitted disease?: No Injury to kidneys or bladder?: No Painful intercourse?: No Weak stream?: No Erection problems?: No Penile pain?: No  Gastrointestinal Nausea?: No Vomiting?: No Indigestion/heartburn?: No Diarrhea?: No Constipation?: No  Constitutional Fever: No Night sweats?: No Weight loss?: No Fatigue?: No  Skin Skin rash/lesions?: No Itching?: No  Eyes Blurred vision?: No Double vision?: No  Ears/Nose/Throat Sore throat?: No Sinus problems?: No  Hematologic/Lymphatic Swollen glands?: No Easy bruising?: Yes  Cardiovascular Leg swelling?: No Chest pain?: No  Respiratory Cough?: Yes Shortness of breath?: Yes  Endocrine Excessive thirst?: No  Musculoskeletal Back pain?: No Joint pain?: No  Neurological Headaches?: No Dizziness?: Yes  Psychologic Depression?: No Anxiety?: No  Physical Exam: BP (!) 152/90   Pulse 89   Ht 6' (1.829 m)   Wt 193 lb 8 oz (87.8 kg)   BMI 26.24 kg/m   Constitutional: Well nourished. Alert and oriented, No acute distress. HEENT: [place] AT, moist mucus membranes. Trachea midline, no masses. Cardiovascular: No clubbing, cyanosis, or edema. Respiratory: Normal respiratory effort, no increased work of breathing. GI: Abdomen is soft, non tender, non distended, no abdominal masses. Liver and spleen not palpable.  No hernias appreciated.  Stool sample for occult testing is not indicated.   GU: No CVA tenderness.  No bladder fullness or masses.  Patient with circumcised phallus.  Urethral meatus is patent.  No penile discharge. No penile lesions or rashes.  Scrotum without lesions, cysts, rashes and/or edema.  Testicles are located scrotally bilaterally. No masses are appreciated in the testicles. Left and right epididymis are normal. Rectal: Patient with  normal sphincter tone. Anus and perineum without scarring or rashes. No rectal masses are appreciated. Prostate is approximately 50 grams, no nodules are appreciated. Seminal vesicles are normal. Skin: No rashes, bruises or suspicious lesions. Lymph: No cervical or inguinal adenopathy. Neurologic: Grossly intact, no focal deficits, moving all 4 extremities. Psychiatric: Normal mood and affect.  Laboratory Data: PSA History  0.8 ng/mL on 04/22/2014  1.0 ng/mL on 10/21/2014  1.07 ng/mL on 08/18/2016  1.1 ng/mL on 07/08/2017  Lab Results  Component Value Date   WBC 8.6 07/08/2017   HGB 17.9 (H) 07/08/2017   HCT 49.3 07/08/2017   MCV 93 07/08/2017   PLT 228 07/08/2017    Lab  Results  Component Value Date   CREATININE 0.86 02/04/2017   Pertinent imaging Results for DARSHAN, SOLANKI (MRN [id number]) as of 07/14/2017 12:18  Ref. Range 07/14/2017 10:59  Scan Result Unknown 159    Assessment & Plan:    1. Testosterone deficiency   -most recent testosterone level is 653 ng/dL on 07/08/2017 (goal 450-600 ng/dL)  -discontinue testosterone therapy at this time as he is not seeing benefit with the therapy and he has been at therapeutic levels for 6 months  2. BPH with LUTS  - IPSS score is 26/5, it is worsening  - Continue conservative management, avoiding bladder irritants and timed voiding's - Encouraged patient to avoid [employer]  - patient could not tolerate alpha blockers due to hypotension  - Initiate 5 alpha reductase inhibitor (finasteride 5 mg daily), discussed side effects  - RTC in 3 months for I PSS and PVR and exam  3. Erectile dysfunction:     -SHIM score is 15, it is worsening  - On nitroglycerin products so not a candidate for PDE 5 inhibitors - not interested in  intracavernosal injections at this time would like to speak with his wife  4. Peyronie's disease  - Curvature is not greater than 30 degrees at this time  - see above  5. Sleep apnea  - Patient now on oxygen at night  Return in about 3 months (around 10/13/2017) for IPSS and PVR.  These notes generated with voice recognition software. I apologize for typographical errors.  Zara Council, [facility] [address] [place], [place] [address] [phone]

## 2017-07-14 ENCOUNTER — Encounter: Payer: Self-pay | Admitting: Urology

## 2017-07-14 ENCOUNTER — Ambulatory Visit: Payer: BLUE CROSS/BLUE SHIELD

## 2017-07-14 ENCOUNTER — Ambulatory Visit (INDEPENDENT_AMBULATORY_CARE_PROVIDER_SITE_OTHER): Payer: BLUE CROSS/BLUE SHIELD | Admitting: Urology

## 2017-07-14 VITALS — BP 152/90 | HR 89 | Ht 72.0 in | Wt 193.5 lb

## 2017-07-14 DIAGNOSIS — E349 Endocrine disorder, unspecified: Secondary | ICD-10-CM | POA: Diagnosis not present

## 2017-07-14 DIAGNOSIS — N138 Other obstructive and reflux uropathy: Secondary | ICD-10-CM | POA: Diagnosis not present

## 2017-07-14 DIAGNOSIS — N529 Male erectile dysfunction, unspecified: Secondary | ICD-10-CM | POA: Diagnosis not present

## 2017-07-14 DIAGNOSIS — N401 Enlarged prostate with lower urinary tract symptoms: Secondary | ICD-10-CM | POA: Diagnosis not present

## 2017-07-14 DIAGNOSIS — N486 Induration penis plastica: Secondary | ICD-10-CM

## 2017-07-14 DIAGNOSIS — R350 Frequency of micturition: Secondary | ICD-10-CM

## 2017-07-14 DIAGNOSIS — G4733 Obstructive sleep apnea (adult) (pediatric): Secondary | ICD-10-CM

## 2017-07-14 LAB — URINALYSIS, COMPLETE
Bilirubin, UA: NEGATIVE
Glucose, UA: NEGATIVE
Ketones, UA: NEGATIVE
LEUKOCYTES UA: NEGATIVE
Nitrite, UA: NEGATIVE
PROTEIN UA: NEGATIVE
RBC, UA: NEGATIVE
Specific Gravity, UA: 1.01 (ref 1.005–1.030)
Urobilinogen, Ur: 0.2 mg/dL (ref 0.2–1.0)
pH, UA: 7 (ref 5.0–7.5)

## 2017-07-14 LAB — BLADDER SCAN AMB NON-IMAGING: SCAN RESULT: 159

## 2017-07-14 MED ORDER — FINASTERIDE 5 MG PO TABS: 5.0000 mg | ORAL_TABLET | Freq: Every day | ORAL | 3 refills | Status: DC

## 2017-07-19 ENCOUNTER — Ambulatory Visit: Payer: BLUE CROSS/BLUE SHIELD | Admitting: Urology

## 2017-08-17 ENCOUNTER — Other Ambulatory Visit: Payer: Self-pay | Admitting: Internal Medicine

## 2017-08-17 MED ORDER — TIOTROPIUM BROMIDE MONOHYDRATE 2.5 MCG/ACT IN AERS: 2.0000 | INHALATION_SPRAY | Freq: Every day | RESPIRATORY_TRACT | 6 refills | Status: DC

## 2017-08-17 MED ORDER — FLUTICASONE FUROATE-VILANTEROL 200-25 MCG/INH IN AEPB: 1.0000 | INHALATION_SPRAY | Freq: Every day | RESPIRATORY_TRACT | 6 refills | Status: DC

## 2017-08-17 NOTE — Telephone Encounter (Signed)
Received medication refills for both Spiriva Respimat 2.5 and Breo 200. Per last office note it's stated that patient was not taking Spiriva. Called patient to confirm, he states he is using both medications. Refills have been submitted to pharmacy. Nothing further needed.

## 2017-09-14 ENCOUNTER — Other Ambulatory Visit: Payer: BLUE CROSS/BLUE SHIELD

## 2017-09-14 ENCOUNTER — Ambulatory Visit: Payer: BLUE CROSS/BLUE SHIELD | Admitting: Oncology

## 2017-09-27 NOTE — Progress Notes (Signed)
Paul Carpenter  [phone] [phone]  ID: [name] OB: Aug 05, 1956  MR#: [id number]  WUX#:[id number]  Patient Care Team: Kirk Ruths, MD as PCP - General (Internal Medicine)  CHIEF COMPLAINT: Secondary polycythemia  INTERVAL HISTORY: Patient returns to clinic today for further evaluation, laboratory work, and consideration of phlebotomy. He continues to be anxious, but otherwise feels well. He has recently discontinued testosterone.  He has significantly reduced his tobacco use.  He has no neurologic complaints. He denies any recent fevers or illnesses. He has no chest pain, cough, hemoptysis, or shortness of breath. He denies any nausea, vomiting, constipation, or diarrhea. He has no urinary complaints. Patient offers no specific complaints today.   REVIEW OF SYSTEMS:   Review of Systems  Constitutional: Negative.  Negative for fever, malaise/fatigue and weight loss.  Respiratory: Negative.  Negative for cough and shortness of breath.   Cardiovascular: Negative.  Negative for chest pain.  Gastrointestinal: Negative.  Negative for abdominal pain, blood in stool and melena.  Genitourinary: Negative.   Musculoskeletal: Positive for joint pain.  Neurological: Negative.  Negative for sensory change and weakness.  Psychiatric/Behavioral: The patient is nervous/anxious.     As per HPI. Otherwise, a complete review of systems is negative.  PAST MEDICAL HISTORY: Past Medical History:  Diagnosis Date  . Anginal pain ([place])   . Anxiety   . Chicken pox   . Complication of anesthesia    o2 dropped after neck fusion  . COPD (chronic obstructive pulmonary disease) ([place])   . Coronary artery disease   . Cough    chronic  clear phlegm  . Dysrhythmia   . GERD (gastroesophageal reflux disease)    h/o reflux/ hoarsness  . Hematochezia   . Hemorrhoids   . Hoarseness   . Hypertension   . Migraines   . OSA (obstructive sleep apnea)    has  CPAP but does not use  . Personal history of tobacco use, presenting hazards to health 03/05/2016  . Pneumonia    5/17  . Raynaud disease   . Raynaud's disease   . Rotator cuff tear    on right  . Shortness of breath dyspnea   . Sleep apnea   . Ulcer (traumatic) of oral mucosa     PAST SURGICAL HISTORY: Past Surgical History:  Procedure Laterality Date  . BACK SURGERY     cervical fusion x 2  . CARDIAC CATHETERIZATION    . CERVICAL DISCECTOMY     x 2  . COLONOSCOPY    . COLONOSCOPY N/A 07/25/2015   Procedure: COLONOSCOPY;  Surgeon: Lollie Sails, MD;  Location: [facility] ENDOSCOPY;  Service: Endoscopy;  Laterality: N/A;  . ELECTROMAGNETIC NAVIGATION BROCHOSCOPY Left 06/28/2016   Procedure: ELECTROMAGNETIC NAVIGATION BRONCHOSCOPY;  Surgeon: Vilinda Boehringer, MD;  Location: [facility] ORS;  Service: Cardiopulmonary;  Laterality: Left;  . ESOPHAGOGASTRODUODENOSCOPY N/A 07/25/2015   Procedure: ESOPHAGOGASTRODUODENOSCOPY (EGD);  Surgeon: Lollie Sails, MD;  Location: [facility] ENDOSCOPY;  Service: Endoscopy;  Laterality: N/A;  . NASAL SINUS SURGERY     x 2   . rotator cuff surgery Right    07/2016  . SEPTOPLASTY    . SKIN GRAFT      FAMILY HISTORY Family History  Problem Relation Age of Onset  . Heart disease Father   . Prostate cancer Father   . Heart disease Paternal Grandmother   . Heart attack Maternal Grandfather 52  . Kidney cancer Neg Hx   . Bladder Cancer Neg  Hx        ADVANCED DIRECTIVES:    HEALTH MAINTENANCE: Social History   Tobacco Use  . Smoking status: Current Every Day Smoker    Packs/day: 2.00    Years: 45.00    Pack years: 90.00    Types: Cigarettes  . Smokeless tobacco: Never Used  Substance Use Topics  . Alcohol use: Yes    Alcohol/week: 1.2 oz    Types: 2 Standard drinks or equivalent per week  . Drug use: No     Allergies  Allergen Reactions  . Lisinopril Other (See Comments)  . Varenicline Other (See Comments)    Current Outpatient  Medications  Medication Sig Dispense Refill  . albuterol (PROAIR HFA) 108 (90 Base) MCG/ACT inhaler Inhale 2 puffs into the lungs every 6 (six) hours as needed for wheezing or shortness of breath. 1 Inhaler 3  . aspirin EC 81 MG tablet Take 81 mg by mouth daily.     Marland Kitchen atorvastatin (LIPITOR) 40 MG tablet Take 1 tablet by mouth daily.    . cyclobenzaprine (FLEXERIL) 5 MG tablet Take by mouth.    . diazepam (VALIUM) 5 MG tablet Take 5 mg by mouth at bedtime as needed for anxiety.     . DULoxetine (CYMBALTA) 60 MG capsule Take by mouth.    . finasteride (PROSCAR) 5 MG tablet Take 1 tablet (5 mg total) by mouth daily. 90 tablet 3  . fluticasone (FLONASE) 50 MCG/ACT nasal spray Place 1 spray into both nostrils daily.    . fluticasone furoate-vilanterol (BREO ELLIPTA) 200-25 MCG/INH AEPB Inhale 1 puff into the lungs daily. 60 each 6  . ipratropium-albuterol (DUONEB) 0.5-2.5 (3) MG/3ML SOLN Take 3 mLs by nebulization every 6 (six) hours. 360 mL 5  . metoprolol tartrate (LOPRESSOR) 25 MG tablet Take 25 mg by mouth 2 (two) times daily.    . nitroGLYCERIN (NITROSTAT) 0.4 MG SL tablet Place under the tongue.    . OXYGEN Inhale into the lungs. 2 units at night    . pantoprazole (PROTONIX) 40 MG tablet Take 40 mg by mouth daily.  3  . polyethylene glycol powder (GLYCOLAX/MIRALAX) powder AS DIRECTED FOR COLONIC PREP.    Marland Kitchen potassium chloride (KLOR-CON) 20 MEQ packet Take 20 mEq by mouth daily.     . Tiotropium Bromide Monohydrate (SPIRIVA RESPIMAT) 2.5 MCG/ACT AERS Inhale 2 puffs into the lungs daily. 4 g 6   No current facility-administered medications for this visit.     OBJECTIVE: Vitals:   09/28/17 1436  BP: 138/81  Pulse: 77  Resp: 18  Temp: 97.9 F (36.6 C)     Body mass index is 25.9 kg/m.    ECOG FS:0 - Asymptomatic  General: Well-developed, well-nourished, no acute distress. Eyes: anicteric sclera. Lungs: Clear to auscultation bilaterally. Heart: Regular rate and rhythm. No rubs,  murmurs, or gallops. Abdomen: Soft, nontender, nondistended. N Musculoskeletal: No edema, cyanosis, or clubbing. Neuro: Alert, answering all questions appropriately.  Skin: No rashes or petechiae noted. Psych: Appears anxious   LAB RESULTS:  Lab Results  Component Value Date   NA 132 (L) 02/04/2017   K 4.3 02/04/2017   CL 98 (L) 02/04/2017   CO2 26 02/04/2017   GLUCOSE 96 02/04/2017   BUN 15 02/04/2017   CREATININE 0.86 02/04/2017   CALCIUM 9.1 02/04/2017   PROT 6.9 06/01/2016   ALBUMIN 4.2 06/01/2016   AST 24 06/01/2016   ALT 26 06/01/2016   ALKPHOS 74 06/01/2016   BILITOT 0.8 06/01/2016  GFRNONAA >60 02/04/2017   GFRAA >60 02/04/2017    Lab Results  Component Value Date   WBC 9.5 09/28/2017   NEUTROABS 7.0 (H) 09/28/2017   HGB 14.8 09/28/2017   HCT 42.3 09/28/2017   MCV 97.5 09/28/2017   PLT 265 09/28/2017     STUDIES:  ASSESSMENT: Secondary polycythemia.  PLAN:    1.  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 Patient was also taking testosterone, but this has now been discontinued.  Previously, the remainder of his laboratory work, including JAK-2 mutation, was either negative or within normal limits. He does not require phlebotomy today.  After lengthy discussion with the patient, it was agreed upon that no further follow-up is necessary.  He has indicated that he may re-initiate testosterone injections in the near future.  If this occurs, recommend monitoring his hemoglobin and if it trends up to near 17.0 please refer him back for further evaluation.   2. Left upper lobe pulmonary nodule: Biopsy was negative for malignant cells. CT results from Mar 04, 2017 reviewed independently with no interval change. Continue yearly imaging under the lung screening program.  3.  Colon polyps:  Patient has a personal history of greater then 10 adenomatous polyps on his most recent conoloscopy. He does not know of any family history of increased polyps or  colon cancer.  Genetic testing to assess for the APC mutation for FAP or AFAP was negative. Continue colonoscopies as per GI. 4. Tobacco Use: Patient continues to heavily smoke, but has decreased significantly. 5. Anxiety: Treatment as per pcp.   Patient expressed understanding and was in agreement with this plan. He also understands that He can call clinic at any time with any questions, concerns, or complaints.    [name], MD   09/30/2017 4:02 PM

## 2017-09-28 ENCOUNTER — Inpatient Hospital Stay: Payer: BLUE CROSS/BLUE SHIELD

## 2017-09-28 ENCOUNTER — Inpatient Hospital Stay: Payer: BLUE CROSS/BLUE SHIELD | Attending: Oncology

## 2017-09-28 ENCOUNTER — Inpatient Hospital Stay: Payer: BLUE CROSS/BLUE SHIELD | Admitting: Oncology

## 2017-09-28 VITALS — BP 138/81 | HR 77 | Temp 97.9°F | Resp 18 | Wt 191.0 lb

## 2017-09-28 DIAGNOSIS — I2510 Atherosclerotic heart disease of native coronary artery without angina pectoris: Secondary | ICD-10-CM | POA: Insufficient documentation

## 2017-09-28 DIAGNOSIS — I10 Essential (primary) hypertension: Secondary | ICD-10-CM

## 2017-09-28 DIAGNOSIS — Z86010 Personal history of colonic polyps: Secondary | ICD-10-CM | POA: Diagnosis not present

## 2017-09-28 DIAGNOSIS — J449 Chronic obstructive pulmonary disease, unspecified: Secondary | ICD-10-CM

## 2017-09-28 DIAGNOSIS — F418 Other specified anxiety disorders: Secondary | ICD-10-CM

## 2017-09-28 DIAGNOSIS — Z8042 Family history of malignant neoplasm of prostate: Secondary | ICD-10-CM | POA: Diagnosis not present

## 2017-09-28 DIAGNOSIS — D751 Secondary polycythemia: Secondary | ICD-10-CM

## 2017-09-28 DIAGNOSIS — F17210 Nicotine dependence, cigarettes, uncomplicated: Secondary | ICD-10-CM | POA: Insufficient documentation

## 2017-09-28 DIAGNOSIS — G4733 Obstructive sleep apnea (adult) (pediatric): Secondary | ICD-10-CM | POA: Insufficient documentation

## 2017-09-28 DIAGNOSIS — I7300 Raynaud's syndrome without gangrene: Secondary | ICD-10-CM | POA: Diagnosis not present

## 2017-09-28 DIAGNOSIS — Z79899 Other long term (current) drug therapy: Secondary | ICD-10-CM | POA: Insufficient documentation

## 2017-09-28 DIAGNOSIS — K219 Gastro-esophageal reflux disease without esophagitis: Secondary | ICD-10-CM

## 2017-09-28 DIAGNOSIS — Z7982 Long term (current) use of aspirin: Secondary | ICD-10-CM | POA: Insufficient documentation

## 2017-09-28 LAB — CBC WITH DIFFERENTIAL/PLATELET
BASOS ABS: 0.1 10*3/uL (ref 0–0.1)
BASOS PCT: 1 %
EOS PCT: 1 %
Eosinophils Absolute: 0.1 10*3/uL (ref 0–0.7)
HEMATOCRIT: 42.3 % (ref 40.0–52.0)
Hemoglobin: 14.8 g/dL (ref 13.0–18.0)
Lymphocytes Relative: 19 %
Lymphs Abs: 1.8 10*3/uL (ref 1.0–3.6)
MCH: 34.2 pg — ABNORMAL HIGH (ref 26.0–34.0)
MCHC: 35.1 g/dL (ref 32.0–36.0)
MCV: 97.5 fL (ref 80.0–100.0)
MONO ABS: 0.5 10*3/uL (ref 0.2–1.0)
MONOS PCT: 5 %
Neutro Abs: 7 10*3/uL — ABNORMAL HIGH (ref 1.4–6.5)
Neutrophils Relative %: 74 %
PLATELETS: 265 10*3/uL (ref 150–440)
RBC: 4.34 MIL/uL — ABNORMAL LOW (ref 4.40–5.90)
RDW: 13.4 % (ref 11.5–14.5)
WBC: 9.5 10*3/uL (ref 3.8–10.6)

## 2017-10-03 ENCOUNTER — Encounter: Payer: Self-pay | Admitting: *Deleted

## 2017-10-04 ENCOUNTER — Ambulatory Visit
Admission: RE | Admit: 2017-10-04 | Discharge: 2017-10-04 | Disposition: A | Discharge status: Home / Self Care | Payer: BLUE CROSS/BLUE SHIELD | Source: Ambulatory Visit | Attending: Gastroenterology | Admitting: Gastroenterology

## 2017-10-04 ENCOUNTER — Encounter
Admission: RE | Disposition: A | Discharge status: Home / Self Care | Payer: Self-pay | Source: Ambulatory Visit | Attending: Gastroenterology

## 2017-10-04 ENCOUNTER — Ambulatory Visit: Payer: BLUE CROSS/BLUE SHIELD | Admitting: Anesthesiology

## 2017-10-04 ENCOUNTER — Encounter: Payer: Self-pay | Admitting: *Deleted

## 2017-10-04 ENCOUNTER — Other Ambulatory Visit: Payer: Self-pay

## 2017-10-04 DIAGNOSIS — F419 Anxiety disorder, unspecified: Secondary | ICD-10-CM | POA: Diagnosis not present

## 2017-10-04 DIAGNOSIS — Z8719 Personal history of other diseases of the digestive system: Secondary | ICD-10-CM | POA: Diagnosis not present

## 2017-10-04 DIAGNOSIS — G4733 Obstructive sleep apnea (adult) (pediatric): Secondary | ICD-10-CM | POA: Diagnosis not present

## 2017-10-04 DIAGNOSIS — D125 Benign neoplasm of sigmoid colon: Secondary | ICD-10-CM | POA: Insufficient documentation

## 2017-10-04 DIAGNOSIS — I2510 Atherosclerotic heart disease of native coronary artery without angina pectoris: Secondary | ICD-10-CM | POA: Diagnosis not present

## 2017-10-04 DIAGNOSIS — K219 Gastro-esophageal reflux disease without esophagitis: Secondary | ICD-10-CM | POA: Diagnosis not present

## 2017-10-04 DIAGNOSIS — I739 Peripheral vascular disease, unspecified: Secondary | ICD-10-CM | POA: Insufficient documentation

## 2017-10-04 DIAGNOSIS — Z79899 Other long term (current) drug therapy: Secondary | ICD-10-CM | POA: Diagnosis not present

## 2017-10-04 DIAGNOSIS — E669 Obesity, unspecified: Secondary | ICD-10-CM | POA: Diagnosis not present

## 2017-10-04 DIAGNOSIS — F17210 Nicotine dependence, cigarettes, uncomplicated: Secondary | ICD-10-CM | POA: Insufficient documentation

## 2017-10-04 DIAGNOSIS — Z9981 Dependence on supplemental oxygen: Secondary | ICD-10-CM | POA: Insufficient documentation

## 2017-10-04 DIAGNOSIS — I10 Essential (primary) hypertension: Secondary | ICD-10-CM | POA: Diagnosis not present

## 2017-10-04 DIAGNOSIS — J449 Chronic obstructive pulmonary disease, unspecified: Secondary | ICD-10-CM | POA: Diagnosis not present

## 2017-10-04 DIAGNOSIS — K5730 Diverticulosis of large intestine without perforation or abscess without bleeding: Secondary | ICD-10-CM | POA: Diagnosis not present

## 2017-10-04 DIAGNOSIS — K635 Polyp of colon: Secondary | ICD-10-CM | POA: Insufficient documentation

## 2017-10-04 DIAGNOSIS — Z6826 Body mass index (BMI) 26.0-26.9, adult: Secondary | ICD-10-CM | POA: Insufficient documentation

## 2017-10-04 DIAGNOSIS — Z09 Encounter for follow-up examination after completed treatment for conditions other than malignant neoplasm: Secondary | ICD-10-CM | POA: Diagnosis present

## 2017-10-04 DIAGNOSIS — Z7982 Long term (current) use of aspirin: Secondary | ICD-10-CM | POA: Diagnosis not present

## 2017-10-04 HISTORY — DX: Chest pain, unspecified: R07.9

## 2017-10-04 HISTORY — DX: Personal history of other infectious and parasitic diseases: Z86.19

## 2017-10-04 HISTORY — PX: COLONOSCOPY WITH PROPOFOL: SHX5780

## 2017-10-04 HISTORY — DX: Personal history of colon polyps, unspecified: Z86.0100

## 2017-10-04 HISTORY — DX: Personal history of colonic polyps: Z86.010

## 2017-10-04 SURGERY — COLONOSCOPY WITH PROPOFOL
Anesthesia: General

## 2017-10-04 MED ORDER — PROPOFOL 10 MG/ML IV BOLUS
INTRAVENOUS | Status: DC | PRN
  Administered 2017-10-04: 60 mg via INTRAVENOUS

## 2017-10-04 MED ORDER — PROPOFOL 500 MG/50ML IV EMUL
INTRAVENOUS | Status: AC
  Filled 2017-10-04: qty 50

## 2017-10-04 MED ORDER — MIDAZOLAM HCL 2 MG/2ML IJ SOLN
INTRAMUSCULAR | Status: AC
  Filled 2017-10-04: qty 2

## 2017-10-04 MED ORDER — LIDOCAINE HCL (CARDIAC) 20 MG/ML IV SOLN
INTRAVENOUS | Status: DC | PRN
  Administered 2017-10-04: 60 mg via INTRAVENOUS

## 2017-10-04 MED ORDER — SODIUM CHLORIDE 0.9 % IV SOLN: INTRAVENOUS | Status: DC

## 2017-10-04 MED ORDER — SODIUM CHLORIDE 0.9 % IV SOLN
INTRAVENOUS | Status: DC
  Administered 2017-10-04 (×2): via INTRAVENOUS

## 2017-10-04 MED ORDER — PROPOFOL 500 MG/50ML IV EMUL
INTRAVENOUS | Status: DC | PRN
  Administered 2017-10-04: 170 ug/kg/min via INTRAVENOUS

## 2017-10-04 MED ORDER — MIDAZOLAM HCL 2 MG/2ML IJ SOLN
INTRAMUSCULAR | Status: DC | PRN
  Administered 2017-10-04: 2 mg via INTRAVENOUS

## 2017-10-04 MED ORDER — PHENYLEPHRINE HCL 10 MG/ML IJ SOLN
INTRAMUSCULAR | Status: DC | PRN
  Administered 2017-10-04: 200 ug via INTRAVENOUS
  Administered 2017-10-04: 100 ug via INTRAVENOUS
  Administered 2017-10-04 (×2): 200 ug via INTRAVENOUS

## 2017-10-04 NOTE — H&P (Signed)
Outpatient short stay form Pre-procedure 10/04/2017 3:42 PM Lollie Sails MD  Primary Physician: Dr. Frazier Richards  Reason for visit:  Colonoscopy  History of present illness:  Patient is a 61 year old male presenting today for colonoscopy in regards to his personal history of adenomatous colon polyps. He tolerated his prep well. He does take 81 mg aspirin this is been held. He takes no other aspirin products or blood thinning agents. Ref it is of note that he does have a history of the reflux. He was recently increased on his reflux medication to twice a day about 2 weeks ago. He states that this has been beneficial. I discussed this further with him today. We will have him back in the office in the 6 weeks or so to follow-up and see how he is doing in that regard. If his symptoms do not continue to improve then would recommend possible EGD again.    Current Facility-Administered Medications:  .  0.9 %  sodium chloride infusion, , Intravenous, Continuous, Lollie Sails, MD, Last Rate: 20 mL/hr at 10/04/17 1343 .  0.9 %  sodium chloride infusion, , Intravenous, Continuous, Lollie Sails, MD  Medications Prior to Admission  Medication Sig Dispense Refill Last Dose  . albuterol (PROAIR HFA) 108 (90 Base) MCG/ACT inhaler Inhale 2 puffs into the lungs every 6 (six) hours as needed for wheezing or shortness of breath. 1 Inhaler 3 Past Week at Unknown time  . aspirin EC 81 MG tablet Take 81 mg by mouth daily.    Past Week at Unknown time  . atorvastatin (LIPITOR) 40 MG tablet Take 1 tablet by mouth daily.   10/03/2017 at Unknown time  . diazepam (VALIUM) 5 MG tablet Take 5 mg by mouth at bedtime as needed for anxiety.    10/03/2017 at Unknown time  . DULoxetine (CYMBALTA) 60 MG capsule Take by mouth daily.    10/04/2017 at Unknown time  . finasteride (PROSCAR) 5 MG tablet Take 1 tablet (5 mg total) by mouth daily. 90 tablet 3 10/04/2017 at Unknown time  . fluticasone (FLONASE) 50  MCG/ACT nasal spray Place 1 spray into both nostrils daily.   10/04/2017 at Unknown time  . fluticasone furoate-vilanterol (BREO ELLIPTA) 200-25 MCG/INH AEPB Inhale 1 puff into the lungs daily. 60 each 6 10/04/2017 at Unknown time  . ipratropium-albuterol (DUONEB) 0.5-2.5 (3) MG/3ML SOLN Take 3 mLs by nebulization every 6 (six) hours. 360 mL 5 Past Month at Unknown time  . metoprolol tartrate (LOPRESSOR) 25 MG tablet Take 25 mg by mouth 2 (two) times daily.   10/04/2017 at Unknown time  . OXYGEN Inhale into the lungs. 2 units at night   10/03/2017 at Unknown time  . pantoprazole (PROTONIX) 40 MG tablet Take 40 mg by mouth daily.  3 10/04/2017 at Unknown time  . potassium chloride (KLOR-CON) 20 MEQ packet Take 20 mEq by mouth daily.    10/04/2017 at Unknown time  . Tiotropium Bromide Monohydrate (SPIRIVA RESPIMAT) 2.5 MCG/ACT AERS Inhale 2 puffs into the lungs daily. 4 g 6 10/04/2017 at Unknown time  . nitroGLYCERIN (NITROSTAT) 0.4 MG SL tablet Place under the tongue.   Taking  . polyethylene glycol powder (GLYCOLAX/MIRALAX) powder AS DIRECTED FOR COLONIC PREP.   Taking     Allergies  Allergen Reactions  . Lisinopril Other (See Comments)  . Varenicline Other (See Comments)     Past Medical History:  Diagnosis Date  . Anginal pain ([place])   . Anxiety   .  Chest pain   . Chicken pox   . Complication of anesthesia    o2 dropped after neck fusion  . COPD (chronic obstructive pulmonary disease) ([place])   . Coronary artery disease   . Cough    chronic  clear phlegm  . Dysrhythmia   . GERD (gastroesophageal reflux disease)    h/o reflux/ hoarsness  . Hematochezia   . Hemorrhoids   . History of chickenpox   . History of colon polyps   . History of Helicobacter pylori infection   . Hoarseness   . Hypertension   . Migraines   . OSA (obstructive sleep apnea)    has CPAP but does not use  . Personal history of tobacco use, presenting hazards to health 03/05/2016  . Pneumonia    5/17  . Raynaud  disease   . Raynaud disease   . Raynaud's disease   . Rotator cuff tear    on right  . Shortness of breath dyspnea   . Sleep apnea   . Ulcer (traumatic) of oral mucosa     Review of systems:      Physical Exam    Heart and lungs: Regular rate and rhythm without rub or gallop, lungs are bilaterally clear.    HEENT: Normocephalic atraumatic eyes are anicteric    Other:     Pertinant exam for procedure: Soft nontender nondistended bowel sounds positive normoactive    Planned proceedures: Colonoscopy and indicated procedures. I have discussed the risks benefits and complications of procedures to include not limited to bleeding, infection, perforation and the risk of sedation and the patient wishes to proceed.    Lollie Sails, MD Gastroenterology 10/04/2017  3:42 PM

## 2017-10-04 NOTE — Anesthesia Preprocedure Evaluation (Signed)
Anesthesia Evaluation  Patient identified by MRN, date of birth, ID band Patient awake    Reviewed: Allergy & Precautions, NPO status , Patient's Chart, lab work & pertinent test results  Airway Mallampati: II       Dental  (+) Teeth Intact   Pulmonary shortness of breath, sleep apnea , COPD, Current Smoker,     + decreased breath sounds      Cardiovascular Exercise Tolerance: Good hypertension, Pt. on medications and Pt. on home beta blockers + CAD and + Peripheral Vascular Disease   Rhythm:Regular     Neuro/Psych  Headaches, Anxiety    GI/Hepatic Neg liver ROS, GERD  Medicated,  Endo/Other  negative endocrine ROS  Renal/GU   negative genitourinary   Musculoskeletal   Abdominal (+) + obese,   Peds negative pediatric ROS (+)  Hematology negative hematology ROS (+)   Anesthesia Other Findings   Reproductive/Obstetrics                             Anesthesia Physical Anesthesia Plan  ASA: III  Anesthesia Plan: General   Post-op Pain Management:    Induction: Intravenous  PONV Risk Score and Plan:   Airway Management Planned: Natural Airway and Nasal Cannula  Additional Equipment:   Intra-op Plan:   Post-operative Plan:   Informed Consent: I have reviewed the patients History and Physical, chart, labs and discussed the procedure including the risks, benefits and alternatives for the proposed anesthesia with the patient or authorized representative who has indicated his/her understanding and acceptance.     Plan Discussed with:   Anesthesia Plan Comments:         Anesthesia Quick Evaluation

## 2017-10-04 NOTE — Transfer of Care (Signed)
Immediate Anesthesia Transfer of Care Note  Patient: Paul Carpenter  Procedure(s) Performed: COLONOSCOPY WITH PROPOFOL (N/A )  Patient Location: PACU and Endoscopy Unit  Anesthesia Type:General  Level of Consciousness: sedated and responds to stimulation  Airway & Oxygen Therapy: Patient Spontanous Breathing and Patient connected to nasal cannula oxygen  Post-op Assessment: Report given to RN and Post -op Vital signs reviewed and stable  Post vital signs: Reviewed and stable  Last Vitals:  Vitals:   10/04/17 1324 10/04/17 1736  BP: (!) 161/93 130/83  Pulse: 75   Resp: 20   Temp: (!) 36.3 C (!) 36.2 C  SpO2: 100%     Last Pain:  Vitals:   10/04/17 1736  TempSrc: Tympanic  PainSc:          Complications: No apparent anesthesia complications

## 2017-10-04 NOTE — Anesthesia Post-op Follow-up Note (Signed)
Anesthesia QCDR form completed.        

## 2017-10-04 NOTE — Op Note (Signed)
[facility] Gastroenterology Patient Name: Paul Carpenter Procedure Date: 10/04/2017 3:47 PM MRN: [id number] Account #: [id number] Date of Birth: 02-03-1956 Admit Type: Outpatient Age: 61 Room: [facility] ENDO ROOM 3 Gender: Male Note Status: Finalized Procedure:            Colonoscopy Indications:          Personal history of colonic polyps Providers:            Lollie Sails, MD Referring MD:         Ocie Cornfield. Ouida Sills MD, MD (Referring MD) Medicines:            Monitored Anesthesia Care Complications:        No immediate complications. Procedure:            Pre-Anesthesia Assessment:                       - ASA Grade Assessment: III - A patient with severe                        systemic disease.                       After obtaining informed consent, the colonoscope was                        passed under direct vision. Throughout the procedure,                        the patient's blood pressure, pulse, and oxygen                        saturations were monitored continuously. The Olympus                        PCF-H180AL colonoscope ( S#: [id number] ) was introduced                        through the anus and advanced to the the cecum,                        identified by appendiceal orifice and ileocecal valve.                        The colonoscopy was unusually difficult due to a                        tortuous colon. Successful completion of the procedure                        was aided by changing the patient to a supine position,                        changing the patient to a prone position, using manual                        pressure and changing endoscopes. The patient tolerated                        the procedure well. The quality of the bowel  preparation was fair. Findings:      Multiple small-mouthed diverticula were found in the sigmoid colon and       descending colon.      Four sessile polyps were found in the distal  sigmoid colon. The polyps       were 4 to 6 mm in size.      The proximal descending colon and splenic flexure were significantly       redundant.      Three sessile polyps were found in the splenic flexure. The polyps were       2 to 4 mm in size. These polyps were removed with a cold snare.       Resection and retrieval were complete.      A 6 mm polyp was found in the splenic flexure. The polyp was sessile.       The polyp was removed with a cold snare. Resection and retrieval were       complete.      Four sessile polyps were found in the sigmoid colon. The polyps were 1       to 8 mm in size. These polyps were removed with a cold snare. Resection       and retrieval were complete.      -- Some polyp sample from the larger polyp removed from the splenic       flexure may have been placed in the first splenic flexure jar. Impression:           - Preparation of the colon was fair.                       - Diverticulosis in the sigmoid colon and in the                        descending colon.                       - Four 4 to 6 mm polyps in the distal sigmoid colon.                       - Redundant colon.                       - Three 2 to 4 mm polyps at the splenic flexure,                        removed with a cold snare. Resected and retrieved.                       - One 6 mm polyp at the splenic flexure, removed with a                        cold snare. Resected and retrieved.                       - Four 1 to 8 mm polyps in the sigmoid colon, removed                        with a cold snare. Resected and retrieved. Recommendation:       - Await pathology results.                       -  Full liquid diet for 1 day, then advance as tolerated                        to low residue diet for 2 days.                       - Telephone GI clinic for pathology results in 1 week. Procedure Code(s):    --- Professional ---                       [id number], Colonoscopy, flexible; with removal of  tumor(s),                        polyp(s), or other lesion(s) by snare technique Diagnosis Code(s):    --- Professional ---                       D12.3, Benign neoplasm of transverse colon (hepatic                        flexure or splenic flexure)                       D12.5, Benign neoplasm of sigmoid colon                       Z86.010, Personal history of colonic polyps                       K57.30, Diverticulosis of large intestine without                        perforation or abscess without bleeding                       Q43.8, Other specified congenital malformations of                        intestine CPT copyright 2016 American Medical Association. All rights reserved. The codes documented in this report are preliminary and upon coder review may  be revised to meet current compliance requirements. Lollie Sails, MD 10/04/2017 5:51:40 PM This report has been signed electronically. Number of Addenda: 0 Note Initiated On: 10/04/2017 3:47 PM Scope Withdrawal Time: 0 hours 29 minutes 35 seconds  Total Procedure Duration: 1 hour 32 minutes 54 seconds       [facility]

## 2017-10-05 ENCOUNTER — Encounter: Payer: Self-pay | Admitting: Gastroenterology

## 2017-10-06 ENCOUNTER — Ambulatory Visit: Payer: BLUE CROSS/BLUE SHIELD | Admitting: Internal Medicine

## 2017-10-06 ENCOUNTER — Encounter: Payer: Self-pay | Admitting: Internal Medicine

## 2017-10-06 VITALS — BP 158/100 | HR 67 | Ht 72.0 in | Wt 196.0 lb

## 2017-10-06 DIAGNOSIS — I208 Other forms of angina pectoris: Secondary | ICD-10-CM

## 2017-10-06 DIAGNOSIS — J449 Chronic obstructive pulmonary disease, unspecified: Secondary | ICD-10-CM

## 2017-10-06 LAB — SURGICAL PATHOLOGY

## 2017-10-06 NOTE — Patient Instructions (Signed)
PATIENT ADVISED TO SEE DR Nehemiah Massed IN OFFICE TODAY!

## 2017-10-06 NOTE — Anesthesia Postprocedure Evaluation (Signed)
Anesthesia Post Note  Patient: Paul Carpenter  Procedure(s) Performed: COLONOSCOPY WITH PROPOFOL (N/A )  Patient location during evaluation: PACU Anesthesia Type: General Level of consciousness: awake and alert Pain management: pain level controlled Vital Signs Assessment: post-procedure vital signs reviewed and stable Respiratory status: spontaneous breathing, nonlabored ventilation, respiratory function stable and patient connected to nasal cannula oxygen Cardiovascular status: blood pressure returned to baseline and stable Postop Assessment: no apparent nausea or vomiting Anesthetic complications: no     Last Vitals:  Vitals:   10/04/17 1756 10/04/17 1806  BP: 128/70 135/65  Pulse: (!) 59 (!) 58  Resp: 15 16  Temp:    SpO2: 99% 97%    Last Pain:  Vitals:   10/05/17 0731  TempSrc:   PainSc: 0-No pain                 Molli Barrows

## 2017-10-06 NOTE — Progress Notes (Signed)
Paul Carpenter      MRN# [id number] Paul Carpenter Jun 05, 1956   CC: Follow up COPD and wheezing   Brief History: Synopsis: Paul Carpenter first saw the [facility] pulmonary clinic in July of 2014 for COPD. Simple spirometry showed a ratio 62%, FEV1 of 2.41 L (60% predicted). He had smoked 2 packs of cigarettes daily since his teenage years and continues to smoke. He has had multiple sinus infections and 2 sinus surgeries in the past.. LUL lung lesion negative for malignancy based on ENB 06/2016.  HPI Patient presents today for follow-up visit of his COPD No signs of infection at this time Still smoking  ONO+for nocturnal hypoxia CT chest reviewed with patient-stable nodules no new opacities 04/2017 PFT's shows ratio 62 with Fev1 68% 2.6L with air trapping and hyperinflation  +chest pain for 2 weeks Chest pressure at rest Not taking nitro Has headaches Has some cough   Current Outpatient Medications:  .  albuterol (PROAIR HFA) 108 (90 Base) MCG/ACT inhaler, Inhale 2 puffs into the lungs every 6 (six) hours as needed for wheezing or shortness of breath., Disp: 1 Inhaler, Rfl: 3 .  aspirin EC 81 MG tablet, Take 81 mg by mouth daily. , Disp: , Rfl:  .  atorvastatin (LIPITOR) 40 MG tablet, Take 1 tablet by mouth daily., Disp: , Rfl:  .  diazepam (VALIUM) 5 MG tablet, Take 5 mg by mouth at bedtime as needed for anxiety. , Disp: , Rfl:  .  DULoxetine (CYMBALTA) 60 MG capsule, Take by mouth daily. , Disp: , Rfl:  .  finasteride (PROSCAR) 5 MG tablet, Take 1 tablet (5 mg total) by mouth daily., Disp: 90 tablet, Rfl: 3 .  fluticasone (FLONASE) 50 MCG/ACT nasal spray, Place 1 spray into both nostrils daily., Disp: , Rfl:  .  fluticasone furoate-vilanterol (BREO ELLIPTA) 200-25 MCG/INH AEPB, Inhale 1 puff into the lungs daily., Disp: 60 each, Rfl: 6 .  ipratropium-albuterol (DUONEB) 0.5-2.5 (3) MG/3ML SOLN, Take 3 mLs by nebulization every 6 (six)  hours., Disp: 360 mL, Rfl: 5 .  metoprolol tartrate (LOPRESSOR) 25 MG tablet, Take 25 mg by mouth 2 (two) times daily., Disp: , Rfl:  .  nitroGLYCERIN (NITROSTAT) 0.4 MG SL tablet, Place under the tongue., Disp: , Rfl:  .  OXYGEN, Inhale into the lungs. 2 units at night, Disp: , Rfl:  .  pantoprazole (PROTONIX) 40 MG tablet, Take 40 mg by mouth daily., Disp: , Rfl: 3 .  polyethylene glycol powder (GLYCOLAX/MIRALAX) powder, AS DIRECTED FOR COLONIC PREP., Disp: , Rfl:  .  potassium chloride (KLOR-CON) 20 MEQ packet, Take 20 mEq by mouth daily. , Disp: , Rfl:  .  Tiotropium Bromide Monohydrate (SPIRIVA RESPIMAT) 2.5 MCG/ACT AERS, Inhale 2 puffs into the lungs daily., Disp: 4 g, Rfl: 6   Review of Systems  Constitutional: Positive for malaise/fatigue. Negative for chills and fever.  Eyes: Negative for blurred vision.  Respiratory: Positive for shortness of breath. Negative for cough, hemoptysis, sputum production and wheezing.   Cardiovascular: Negative for chest pain.  Gastrointestinal: Negative for heartburn, nausea and vomiting.  Skin: Negative for rash.   OTHER ROS NEGATIVE   Allergies:  Lisinopril and Varenicline  Physical Examination:  BP (!) 158/100 (BP Location: Left Arm, Cuff Size: Normal)   Pulse 67   Ht 6' (1.829 m)   Wt 196 lb (88.9 kg)   SpO2 99%   BMI 26.58 kg/m   General Appearance: No distress  HEENT: PERRLA,  no ptosis, no other lesions noticed Pulmonary:normal breath sounds., diaphragmatic excursion normal.+wheezing, No rales   Cardiovascular:  Normal S1,S2.  No m/r/g.     Abdomen:Exam: Benign, Soft, non-tender, No masses  Skin:   warm, no rashes, no ecchymosis  Extremities: normal, no cyanosis, clubbing, warm with normal capillary refill.    Assessment and Plan:  61 year old male with Moderate COPD Gold stage B with chronic hypoxic resp failure h/o left upper lobe lung lesion   COPD (chronic obstructive pulmonary disease) Still smoking -smoking cessation  strongly advised Cont with Breo 200/25 Not taking spiriva   Cigarette smoker Tobacco Cessation - Counseling regarding benefits of smoking cessation strategies was provided for more than 3 min. - Educated that at this time smoking- cessation represents the single most important step that patient can take to enhance the length and quality of live.   Mass of upper lobe of left lung ENB of LUL lesion negative for malignant cell in 06/2016. Repeat CT chest in 08/2016 with RUL small scarring lesion, this is new.  Had another CTA Chest 09/09/16 for severe dyspnea - no PE, but LUL PNA CT chest 03/2017- -Apical right upper lobe 5 mm pulmonary nodule stable back to 03/14/2015 - 6 mm apical right upper lobe pulmonary nodule stable since at least 03/22/2016.  -tree-in-bud type opacity in the anterior right middle lobe is stable since 08/23/2016 -Apical left upper lobe 9 x 5 mm  nodule is stable since at least 03/22/2016  - clinical monitoring at this time-follow up CT chest in 6-12 months  CHronic Hypoxic resp failure -patient will need 2L [place] oxygen at night   ANGINA I have discussed case with Dr Serafina Royals about patient with ongoing angina I have advised patient to see Dr Nehemiah Massed today, the office has instructed [place] to inform patient to come to office now. Patient states that he would like to go to office by himself.   Patient satisfied with Plan of action and management. All questions answered I have explained that his lung function will decline with ongoing  Tobacco abuse  Paul Carpenter, M.D.  [facility]  Medical Director Jessie Director [facility] Cardio-Pulmonary Department       I

## 2017-10-11 NOTE — Progress Notes (Signed)
10/12/2017 10:51 AM   Paul Carpenter 1956/06/03 [id number]  Referring provider: Kirk Ruths, MD [place] [place], [place] [address]  Chief Complaint  Patient presents with  . Benign Prostatic Hypertrophy    HPI: Patient is a 61 year old Caucasian male with testosterone deficiency, erectile dysfunction and BPH with LUT S who presents today for 3 month follow-up  Testosterone deficiency Patient is experiencing a decrease in libido, a lack of energy, a decrease in strength, a loss in height, A decreased enjoyment of life,  erections being less strong, a recent deterioration in an ability to play sports fine asleep after dinner and a recent deterioration in his work performance. This is indicated by his responses to the ADAM questionnaire.  He is still having spontaneous erections at night.   He is on oxygen at night.   He has discontinued his therapy as he felt no improvement in his symptoms after being at therapeutic levels for 6 months. His last serum testosterone level was 653 ng/dL on 07/08/2017. His hemoglobin is 14.8 and his hematocrit is 42.3% in 09/2017.  He is interested in restarting therapy at this time.     BPH WITH LUTS His IPSS score today is 15, which is moderate lower urinary tract symptomatology.  He is mixed with his quality life due to his urinary symptoms.  His PVR is 92 mL   His UA is negative.  His previous I PSS score was 26/5.  His previous PVR was 159 mL.  His major complaints today are nocturia and incontinence.  He was started on finasteride 5 mg daily three months ago.  He does not feel that it has helped.  These symptoms occur mostly in the evening.  He does wear oxygen at night, but the cannula falls off a lot.  He has had these symptoms for several years.  He denies any dysuria, hematuria or suprapubic pain.  He also denies any recent fevers, chills, nausea or vomiting.  His father has been diagnosed with non fatal  prostate cancer.   IPSS    Row Name 10/12/17 1000         International Prostate Symptom Score   How often have you had the sensation of not emptying your bladder?  Less than half the time     How often have you had to urinate less than every two hours?  Less than half the time     How often have you found you stopped and started again several times when you urinated?  Less than 1 in 5 times     How often have you found it difficult to postpone urination?  More than half the time     How often have you had a weak urinary stream?  Less than half the time     How often have you had to strain to start urination?  Less than 1 in 5 times     How many times did you typically get up at night to urinate?  2 Times     Total IPSS Score  14       Quality of Life due to urinary symptoms   If you were to spend the rest of your life with your urinary condition just the way it is now how would you feel about that?  Mixed        Score:  1-7 Mild 8-19 Moderate 20-35 Severe  PMH: Past Medical History:  Diagnosis  Date  . Anginal pain ([place])   . Anxiety   . Chest pain   . Chicken pox   . Complication of anesthesia    o2 dropped after neck fusion  . COPD (chronic obstructive pulmonary disease) ([place])   . Coronary artery disease   . Cough    chronic  clear phlegm  . Dysrhythmia   . GERD (gastroesophageal reflux disease)    h/o reflux/ hoarsness  . Hematochezia   . Hemorrhoids   . History of chickenpox   . History of colon polyps   . History of Helicobacter pylori infection   . Hoarseness   . Hypertension   . Migraines   . OSA (obstructive sleep apnea)    has CPAP but does not use  . Personal history of tobacco use, presenting hazards to health 03/05/2016  . Pneumonia    5/17  . Raynaud disease   . Raynaud disease   . Raynaud's disease   . Rotator cuff tear    on right  . Shortness of breath dyspnea   . Sleep apnea   . Ulcer (traumatic) of oral mucosa     Surgical History: Past  Surgical History:  Procedure Laterality Date  . BACK SURGERY     cervical fusion x 2  . CARDIAC CATHETERIZATION    . CERVICAL DISCECTOMY     x 2  . COLONOSCOPY    . COLONOSCOPY N/A 07/25/2015   Procedure: COLONOSCOPY;  Surgeon: Lollie Sails, MD;  Location[facility] ENDOSCOPY;  Service: Endoscopy;  Laterality: N/A;  . COLONOSCOPY WITH PROPOFOL N/A 10/04/2017   Procedure: COLONOSCOPY WITH PROPOFOL;  Surgeon: Lollie Sails, MD;  Location[facility] ENDOSCOPY;  Service: Endoscopy;  Laterality: N/A;  . ELECTROMAGNETIC NAVIGATION BROCHOSCOPY Left 06/28/2016   Procedure: ELECTROMAGNETIC NAVIGATION BRONCHOSCOPY;  Surgeon: Vilinda Boehringer, MD;  Location: [facility] ORS;  Service: Cardiopulmonary;  Laterality: Left;  . ESOPHAGOGASTRODUODENOSCOPY N/A 07/25/2015   Procedure: ESOPHAGOGASTRODUODENOSCOPY (EGD);  Surgeon: Lollie Sails, MD;  Location: [facility] ENDOSCOPY;  Service: Endoscopy;  Laterality: N/A;  . NASAL SINUS SURGERY     x 2   . rotator cuff surgery Right    07/2016  . SEPTOPLASTY    . SKIN GRAFT      Home Medications:  Allergies as of 10/12/2017      Reactions   Lisinopril Other (See Comments)   Varenicline Other (See Comments)      Medication List        Accurate as of 10/12/17 10:51 AM. Always use your most recent med list.          albuterol 108 (90 Base) MCG/ACT inhaler Commonly known as:  PROAIR HFA Inhale 2 puffs into the lungs every 6 (six) hours as needed for wheezing or shortness of breath.   aspirin EC 81 MG tablet Take 81 mg by mouth daily.   atorvastatin 40 MG tablet Commonly known as:  LIPITOR Take 1 tablet by mouth daily.   diazepam 5 MG tablet Commonly known as:  VALIUM Take 5 mg by mouth at bedtime as needed for anxiety.   DULoxetine 60 MG capsule Commonly known as:  CYMBALTA Take by mouth daily.   finasteride 5 MG tablet Commonly known as:  PROSCAR Take 1 tablet (5 mg total) by mouth daily.   fluticasone 50 MCG/ACT nasal spray Commonly known as:   FLONASE Place 1 spray into both nostrils daily.   fluticasone furoate-vilanterol 200-25 MCG/INH Aepb Commonly known as:  BREO ELLIPTA Inhale 1 puff into  the lungs daily.   ipratropium-albuterol 0.5-2.5 (3) MG/3ML Soln Commonly known as:  DUONEB Take 3 mLs by nebulization every 6 (six) hours.   metoprolol tartrate 25 MG tablet Commonly known as:  LOPRESSOR Take 25 mg by mouth 2 (two) times daily.   nitroGLYCERIN 0.4 MG SL tablet Commonly known as:  NITROSTAT Place under the tongue.   OXYGEN Inhale into the lungs. 2 units at night   pantoprazole 40 MG tablet Commonly known as:  PROTONIX Take 40 mg by mouth daily.   potassium chloride 20 MEQ packet Commonly known as:  KLOR-CON Take 20 mEq by mouth daily.   Tiotropium Bromide Monohydrate 2.5 MCG/ACT Aers Commonly known as:  SPIRIVA RESPIMAT Inhale 2 puffs into the lungs daily.       Allergies:  Allergies  Allergen Reactions  . Lisinopril Other (See Comments)  . Varenicline Other (See Comments)    Family History: Family History  Problem Relation Age of Onset  . Heart disease Father   . Prostate cancer Father   . Heart disease Paternal Grandmother   . Heart attack Maternal Grandfather 52  . Kidney cancer Neg Hx   . Bladder Cancer Neg Hx     Social History:  reports that he has been smoking cigarettes.  He has a 67.50 pack-year smoking history. he has never used smokeless tobacco. He reports that he drinks about 1.2 oz of alcohol per week. He reports that he does not use drugs.  ROS: UROLOGY Frequent Urination?: No Hard to postpone urination?: No Burning/pain with urination?: No Get up at night to urinate?: Yes Leakage of urine?: Yes Urine stream starts and stops?: No Trouble starting stream?: No Do you have to strain to urinate?: No Blood in urine?: No Urinary tract infection?: No Sexually transmitted disease?: No Injury to kidneys or bladder?: No Painful intercourse?: No Weak stream?: No Erection  problems?: No Penile pain?: No  Gastrointestinal Nausea?: No Vomiting?: No Indigestion/heartburn?: No Diarrhea?: No Constipation?: No  Constitutional Fever: No Night sweats?: No Weight loss?: No Fatigue?: No  Skin Skin rash/lesions?: No Itching?: No  Eyes Blurred vision?: No Double vision?: No  Ears/Nose/Throat Sore throat?: No Sinus problems?: No  Hematologic/Lymphatic Swollen glands?: No Easy bruising?: No  Cardiovascular Leg swelling?: No Chest pain?: No  Respiratory Cough?: No Shortness of breath?: No  Endocrine Excessive thirst?: No  Musculoskeletal Back pain?: No Joint pain?: No  Neurological Headaches?: No Dizziness?: No  Psychologic Depression?: No Anxiety?: No  Physical Exam: BP 135/82 (BP Location: Right Arm, Patient Position: Sitting, Cuff Size: Large)   Pulse 80   Ht 6' (1.829 m)   Wt 189 lb (85.7 kg)   BMI 25.63 kg/m   Constitutional: Well nourished. Alert and oriented, No acute distress. HEENT: [place] AT, moist mucus membranes. Trachea midline, no masses. Cardiovascular: No clubbing, cyanosis, or edema. Respiratory: Normal respiratory effort, no increased work of breathing. Skin: No rashes, bruises or suspicious lesions. Lymph: No cervical or inguinal adenopathy. Neurologic: Grossly intact, no focal deficits, moving all 4 extremities. Psychiatric: Normal mood and affect.  Laboratory Data: PSA History  0.8 ng/mL on 04/22/2014  1.0 ng/mL on 10/21/2014  1.07 ng/mL on 08/18/2016  1.1 ng/mL on 07/08/2017  Lab Results  Component Value Date   WBC 9.5 09/28/2017   HGB 14.8 09/28/2017   HCT 42.3 09/28/2017   MCV 97.5 09/28/2017   PLT 265 09/28/2017    Lab Results  Component Value Date   CREATININE 0.86 02/04/2017   Pertinent imaging Results  for NAZIR, HACKER (MRN [id number]) as of 10/12/2017 10:32  Ref. Range 10/12/2017 10:19  Scan Result Unknown 92     Assessment & Plan:    1. Testosterone deficiency    -discontinue testosterone therapy at this time as he is not seeing benefit with the therapy and he has been at therapeutic levels for 6 months  - at today's visit he would like to restart his therapy - did advise patient that it may not help with his ED  - check testosterone level today - will start Clomid 50 mg 1/2 tablet daily if it is sub therapeutic  2. BPH with LUTS  - IPSS score is 15/3, it is improving  - Continue conservative management, avoiding bladder irritants and timed voiding's - Encouraged patient to avoid [employer]  - patient could not tolerate alpha blockers due to hypotension  - did not feel finasteride helped - still having night time symptoms and daytime frequency  - offered cystoscopy to evaluate for BOO, but patient deferred  - will have a trial of Myrbetriq 25 mg daily  - RTC in 3 weeks for I PSS and PVR  3. Erectile dysfunction:     -SHIM score is 15, it is worsening  - On nitroglycerin products so not a candidate for PDE 5 inhibitors - not interested in intracavernosal injections at this time would like to speak with his wife  4. Peyronie's disease  - Curvature is not greater than 30 degrees at this time  - see above  5. Sleep apnea  - Patient now on oxygen at night - states the cannula falls off a lot  - advised patient that his night time symptoms may not improve without full time oxygen  Return in about 3 weeks (around 11/02/2017) for IPSS and PVR.  These notes generated with voice recognition software. I apologize for typographical errors.  Zara Council, [facility] [address], [place], [place] [address] [phone]

## 2017-10-12 ENCOUNTER — Ambulatory Visit: Payer: BLUE CROSS/BLUE SHIELD | Admitting: Urology

## 2017-10-12 ENCOUNTER — Encounter: Payer: Self-pay | Admitting: Urology

## 2017-10-12 VITALS — BP 135/82 | HR 80 | Ht 72.0 in | Wt 189.0 lb

## 2017-10-12 DIAGNOSIS — N138 Other obstructive and reflux uropathy: Secondary | ICD-10-CM

## 2017-10-12 DIAGNOSIS — E349 Endocrine disorder, unspecified: Secondary | ICD-10-CM | POA: Diagnosis not present

## 2017-10-12 DIAGNOSIS — N401 Enlarged prostate with lower urinary tract symptoms: Secondary | ICD-10-CM

## 2017-10-12 DIAGNOSIS — G4733 Obstructive sleep apnea (adult) (pediatric): Secondary | ICD-10-CM | POA: Diagnosis not present

## 2017-10-12 DIAGNOSIS — N529 Male erectile dysfunction, unspecified: Secondary | ICD-10-CM

## 2017-10-12 LAB — BLADDER SCAN AMB NON-IMAGING: Scan Result: 92

## 2017-10-13 LAB — TESTOSTERONE: TESTOSTERONE: 397 ng/dL (ref 264–916)

## 2017-10-14 ENCOUNTER — Telehealth: Payer: Self-pay

## 2017-10-14 NOTE — Telephone Encounter (Signed)
-----   Message from Nori Riis, PA-C sent at 10/13/2017  8:19 AM EST ----- Please let Mr. Shovlin know that his testosterone is still in the normal range.  We will not start Clomid at this time.

## 2017-10-14 NOTE — Telephone Encounter (Signed)
Spoke with pt in reference to lab results and clomid. Pt voiced understanding.

## 2017-11-02 NOTE — Progress Notes (Deleted)
11/03/2017 7:57 AM   [name] 27-Feb-1956 [id number]  Referring provider: Kirk Ruths, MD [facility] [place], [place] [address]  No chief complaint on file.   HPI: Patient is a 62 year old Caucasian male with a history of testosterone deficiency, erectile dysfunction and BPH with LUT S who presents today for 3 week follow-up  Testosterone deficiency Patient is experiencing a decrease in libido, a lack of energy, a decrease in strength, a loss in height, A decreased enjoyment of life,  erections being less strong, a recent deterioration in an ability to play sports fine asleep after dinner and a recent deterioration in his work performance. This is indicated by his responses to the ADAM questionnaire.  He is still having spontaneous erections at night.   He is on oxygen at night.   He has discontinued his therapy as he felt no improvement in his symptoms after being at therapeutic levels for 6 months. His last serum testosterone level was 653 ng/dL on 07/08/2017. His hemoglobin is 14.8 and his hematocrit is 42.3% in 09/2017.  His testosterone level was normal at his last visit, so Clomid was not restarted.     BPH WITH LUTS His IPSS score today is ***, which is *** lower urinary tract symptomatology.  He is *** with his quality life due to his urinary symptoms.  His PVR is *** mL   His BP is ***.  His previous I PSS score was 15/3.  His previous PVR was 92 mL.  His major complaints today are nocturia and incontinence.  He was started on finasteride 5 mg daily three months ago.  He does not feel that it has helped.  These symptoms occur mostly in the evening.  He does wear oxygen at night, but the cannula falls off a lot.  He has had these symptoms for several years.  He denies any dysuria, hematuria or suprapubic pain.  He also denies any recent fevers, chills, nausea or vomiting.  His father has been diagnosed with non fatal prostate cancer.     IPSS    Row Name 10/12/17 1000         International Prostate Symptom Score   How often have you had the sensation of not emptying your bladder?  Less than half the time     How often have you had to urinate less than every two hours?  Less than half the time     How often have you found you stopped and started again several times when you urinated?  Less than 1 in 5 times     How often have you found it difficult to postpone urination?  More than half the time     How often have you had a weak urinary stream?  Less than half the time     How often have you had to strain to start urination?  Less than 1 in 5 times     How many times did you typically get up at night to urinate?  2 Times     Total IPSS Score  14       Quality of Life due to urinary symptoms   If you were to spend the rest of your life with your urinary condition just the way it is now how would you feel about that?  Mixed        Score:  1-7 Mild 8-19 Moderate 20-35 Severe  PMH: Past Medical History:  Diagnosis Date  . Anginal pain ([place])   . Anxiety   . Chest pain   . Chicken pox   . Complication of anesthesia    o2 dropped after neck fusion  . COPD (chronic obstructive pulmonary disease) ([place])   . Coronary artery disease   . Cough    chronic  clear phlegm  . Dysrhythmia   . GERD (gastroesophageal reflux disease)    h/o reflux/ hoarsness  . Hematochezia   . Hemorrhoids   . History of chickenpox   . History of colon polyps   . History of Helicobacter pylori infection   . Hoarseness   . Hypertension   . Migraines   . OSA (obstructive sleep apnea)    has CPAP but does not use  . Personal history of tobacco use, presenting hazards to health 03/05/2016  . Pneumonia    5/17  . Raynaud disease   . Raynaud disease   . Raynaud's disease   . Rotator cuff tear    on right  . Shortness of breath dyspnea   . Sleep apnea   . Ulcer (traumatic) of oral mucosa     Surgical History: Past Surgical History:   Procedure Laterality Date  . BACK SURGERY     cervical fusion x 2  . CARDIAC CATHETERIZATION    . CERVICAL DISCECTOMY     x 2  . COLONOSCOPY    . COLONOSCOPY N/A 07/25/2015   Procedure: COLONOSCOPY;  Surgeon: Lollie Sails, MD;  Location: [facility] ENDOSCOPY;  Service: Endoscopy;  Laterality: N/A;  . COLONOSCOPY WITH PROPOFOL N/A 10/04/2017   Procedure: COLONOSCOPY WITH PROPOFOL;  Surgeon: Lollie Sails, MD;  Location: [facility] ENDOSCOPY;  Service: Endoscopy;  Laterality: N/A;  . ELECTROMAGNETIC NAVIGATION BROCHOSCOPY Left 06/28/2016   Procedure: ELECTROMAGNETIC NAVIGATION BRONCHOSCOPY;  Surgeon: Vilinda Boehringer, MD;  Location: [facility] ORS;  Service: Cardiopulmonary;  Laterality: Left;  . ESOPHAGOGASTRODUODENOSCOPY N/A 07/25/2015   Procedure: ESOPHAGOGASTRODUODENOSCOPY (EGD);  Surgeon: Lollie Sails, MD;  Location: [facility] ENDOSCOPY;  Service: Endoscopy;  Laterality: N/A;  . NASAL SINUS SURGERY     x 2   . rotator cuff surgery Right    07/2016  . SEPTOPLASTY    . SKIN GRAFT      Home Medications:  Allergies as of 11/03/2017      Reactions   Lisinopril Other (See Comments)   Varenicline Other (See Comments)      Medication List        Accurate as of 11/02/17  7:57 AM. Always use your most recent med list.          albuterol 108 (90 Base) MCG/ACT inhaler Commonly known as:  PROAIR HFA Inhale 2 puffs into the lungs every 6 (six) hours as needed for wheezing or shortness of breath.   aspirin EC 81 MG tablet Take 81 mg by mouth daily.   atorvastatin 40 MG tablet Commonly known as:  LIPITOR Take 1 tablet by mouth daily.   diazepam 5 MG tablet Commonly known as:  VALIUM Take 5 mg by mouth at bedtime as needed for anxiety.   DULoxetine 60 MG capsule Commonly known as:  CYMBALTA Take by mouth daily.   finasteride 5 MG tablet Commonly known as:  PROSCAR Take 1 tablet (5 mg total) by mouth daily.   fluticasone 50 MCG/ACT nasal spray Commonly known as:  FLONASE Place 1 spray  into both nostrils daily.   fluticasone furoate-vilanterol 200-25 MCG/INH Aepb Commonly known as:  BREO ELLIPTA Inhale 1  puff into the lungs daily.   ipratropium-albuterol 0.5-2.5 (3) MG/3ML Soln Commonly known as:  DUONEB Take 3 mLs by nebulization every 6 (six) hours.   metoprolol tartrate 25 MG tablet Commonly known as:  LOPRESSOR Take 25 mg by mouth 2 (two) times daily.   nitroGLYCERIN 0.4 MG SL tablet Commonly known as:  NITROSTAT Place under the tongue.   OXYGEN Inhale into the lungs. 2 units at night   pantoprazole 40 MG tablet Commonly known as:  PROTONIX Take 40 mg by mouth daily.   potassium chloride 20 MEQ packet Commonly known as:  KLOR-CON Take 20 mEq by mouth daily.   Tiotropium Bromide Monohydrate 2.5 MCG/ACT Aers Commonly known as:  SPIRIVA RESPIMAT Inhale 2 puffs into the lungs daily.       Allergies:  Allergies  Allergen Reactions  . Lisinopril Other (See Comments)  . Varenicline Other (See Comments)    Family History: Family History  Problem Relation Age of Onset  . Heart disease Father   . Prostate cancer Father   . Heart disease Paternal Grandmother   . Heart attack Maternal Grandfather 52  . Kidney cancer Neg Hx   . Bladder Cancer Neg Hx     Social History:  reports that he has been smoking cigarettes.  He has a 67.50 pack-year smoking history. he has never used smokeless tobacco. He reports that he drinks about 1.2 oz of alcohol per week. He reports that he does not use drugs.  ROS:                                        Physical Exam: There were no vitals taken for this visit.  Constitutional: Well nourished. Alert and oriented, No acute distress. HEENT: [place] AT, moist mucus membranes. Trachea midline, no masses. Cardiovascular: No clubbing, cyanosis, or edema. Respiratory: Normal respiratory effort, no increased work of breathing. Skin: No rashes, bruises or suspicious lesions. Lymph: No cervical or  inguinal adenopathy. Neurologic: Grossly intact, no focal deficits, moving all 4 extremities. Psychiatric: Normal mood and affect.  Constitutional: Well nourished. Alert and oriented, No acute distress. HEENT: [place] AT, moist mucus membranes. Trachea midline, no masses. Cardiovascular: No clubbing, cyanosis, or edema. Respiratory: Normal respiratory effort, no increased work of breathing. GI: Abdomen is soft, non tender, non distended, no abdominal masses. Liver and spleen not palpable.  No hernias appreciated.  Stool sample for occult testing is not indicated.   GU: No CVA tenderness.  No bladder fullness or masses.  Patient with circumcised/uncircumcised phallus. ***Foreskin easily retracted***  Urethral meatus is patent.  No penile discharge. No penile lesions or rashes. Scrotum without lesions, cysts, rashes and/or edema.  Testicles are located scrotally bilaterally. No masses are appreciated in the testicles. Left and right epididymis are normal. Rectal: Patient with  normal sphincter tone. Anus and perineum without scarring or rashes. No rectal masses are appreciated. Prostate is approximately *** grams, *** nodules are appreciated. Seminal vesicles are normal. Skin: No rashes, bruises or suspicious lesions. Lymph: No cervical or inguinal adenopathy. Neurologic: Grossly intact, no focal deficits, moving all 4 extremities. Psychiatric: Normal mood and affect.   Laboratory Data: PSA History  0.8 ng/mL on 04/22/2014  1.0 ng/mL on 10/21/2014  1.07 ng/mL on 08/18/2016  1.1 ng/mL on 07/08/2017  Lab Results  Component Value Date   WBC 9.5 09/28/2017   HGB 14.8 09/28/2017   HCT 42.3  09/28/2017   MCV 97.5 09/28/2017   PLT 265 09/28/2017    Lab Results  Component Value Date   CREATININE 0.86 02/04/2017   I have reviewed the labs  Pertinent imaging ***    Assessment & Plan:    1. Testosterone deficiency   -discontinue testosterone therapy at this time as he is not seeing  benefit with the therapy and he has been at therapeutic levels for 6 months  - at today's visit he would like to restart his therapy - did advise patient that it may not help with his ED  - testosterone levels were normal at his last visit - Clomid not restarted  2. BPH with LUTS  - IPSS score is 15/3, it is improving  - Continue conservative management, avoiding bladder irritants and timed voiding's - Encouraged patient to avoid [employer]  - patient could not tolerate alpha blockers due to hypotension  - did not feel finasteride helped - still having night time symptoms and daytime frequency  - offered cystoscopy to evaluate for BOO, but patient deferred  - will have a trial of Myrbetriq 25 mg daily  - RTC in 3 weeks for I PSS and PVR  3. Erectile dysfunction:     -SHIM score is 15, it is worsening  - On nitroglycerin products so not a candidate for PDE 5 inhibitors - not interested in intracavernosal injections at this time would like to speak with his wife  4. Peyronie's disease  - Curvature is not greater than 30 degrees at this time  - see above  5. Sleep apnea  - Patient now on oxygen at night - states the cannula falls off a lot  - advised patient that his night time symptoms may not improve without full time oxygen  No Follow-up on file.  These notes generated with voice recognition software. I apologize for typographical errors.  Zara Council, [facility] [address], [place], [place] [address] [phone]

## 2017-11-03 ENCOUNTER — Ambulatory Visit: Payer: BLUE CROSS/BLUE SHIELD | Admitting: Urology

## 2017-11-24 ENCOUNTER — Telehealth: Payer: Self-pay

## 2017-11-24 MED ORDER — MIRABEGRON ER 25 MG PO TB24: 25.0000 mg | ORAL_TABLET | Freq: Every day | ORAL | 1 refills | Status: DC

## 2017-11-24 NOTE — Telephone Encounter (Signed)
Pt called stating he was given myrbetriq samples and they are working. Pt stated that he was supposed to follow up but was not able to due to having the flu. Pt requested a script to be sent into his pharmacy. 30 days and 1 refill was given to allow pt to reschedule his appt. Pt voiced understanding.

## 2018-01-04 ENCOUNTER — Telehealth: Payer: Self-pay | Admitting: Urology

## 2018-01-04 NOTE — Telephone Encounter (Signed)
Pt needs Myrbetriq 25 mg samples left up front.  He said this has been going on for a couple of months now.

## 2018-01-10 NOTE — Progress Notes (Signed)
01/11/2018 12:33 PM   Paul Carpenter May 27, 1956 [id number]  Referring provider: Kirk Ruths, MD [facility] [place], [place] [address]  Chief Complaint  Patient presents with  . Follow-up    HPI: Patient is a 62 year old Caucasian male with testosterone deficiency, erectile dysfunction and BPH with LUT S who presents today for 3 week follow-up  Testosterone deficiency Patient is still experiencing a decrease in libido, a lack of energy and fatigue.  He is still having spontaneous erections at night.   He is on oxygen at night.   He had discontinued his therapy as he felt no improvement in his symptoms after being at therapeutic levels for 6 months. He is on oxygen at night.  His serum testosterone levels reached 653 ng/dL on 07/08/2017 with the testosterone cypionate.  His last serum testosterone level was 442 ng/dL on 01/11/2018.  His prolactin was 5.4, his FSH was 20.1 and his LH was 24.4.  His hemoglobin is 14.8 and his hematocrit is 42.3% in 09/2017.  He states he is not on any testosterone therapy at this time.  He has also noted left nipple soreness over the last month.  He denies any nipple discharge or breast lumps.     BPH WITH LUTS His IPSS score today is 17, which is moderate lower urinary tract symptomatology.  He is mixed with his quality life due to his urinary symptoms.  His PVR is 65 mL   His previous I PSS score was 15/3.  His previous PVR was 92 mL.  His major complaints today are urgency, nocturia and hesitancy.  He was started on finasteride 5 mg daily six months ago.  He does not feel that it has helped.  These symptoms occur mostly in the evening.  He does wear oxygen at night, but the cannula falls off a lot.  He has had these symptoms for several years.  He was given a trial of Myrbetriq 25 mg daily, but he missed his three week follow up due to the flu.  He believes the Myrbetriq may have helped.  He denies any dysuria,  hematuria or suprapubic pain.  He also denies any recent fevers, chills, nausea or vomiting.  His father has been diagnosed with non fatal prostate cancer.   IPSS    Row Name 01/11/18 1000         International Prostate Symptom Score   How often have you had the sensation of not emptying your bladder?  Less than 1 in 5     How often have you had to urinate less than every two hours?  Almost always     How often have you found you stopped and started again several times when you urinated?  Less than half the time     How often have you found it difficult to postpone urination?  About half the time     How often have you had a weak urinary stream?  Less than half the time     How often have you had to strain to start urination?  Less than half the time     How many times did you typically get up at night to urinate?  2 Times     Total IPSS Score  17       Quality of Life due to urinary symptoms   If you were to spend the rest of your life with your urinary condition just the way  it is now how would you feel about that?  Mixed        Score:  1-7 Mild 8-19 Moderate 20-35 Severe  PMH: Past Medical History:  Diagnosis Date  . Anginal pain ([place])   . Anxiety   . Chest pain   . Chicken pox   . Complication of anesthesia    o2 dropped after neck fusion  . COPD (chronic obstructive pulmonary disease) ([place])   . Coronary artery disease   . Cough    chronic  clear phlegm  . Dysrhythmia   . GERD (gastroesophageal reflux disease)    h/o reflux/ hoarsness  . Hematochezia   . Hemorrhoids   . History of chickenpox   . History of colon polyps   . History of Helicobacter pylori infection   . Hoarseness   . Hypertension   . Migraines   . OSA (obstructive sleep apnea)    has CPAP but does not use  . Personal history of tobacco use, presenting hazards to health 03/05/2016  . Pneumonia    5/17  . Raynaud disease   . Raynaud disease   . Raynaud's disease   . Rotator cuff tear    on  right  . Shortness of breath dyspnea   . Sleep apnea   . Ulcer (traumatic) of oral mucosa     Surgical History: Past Surgical History:  Procedure Laterality Date  . BACK SURGERY     cervical fusion x 2  . CARDIAC CATHETERIZATION    . CERVICAL DISCECTOMY     x 2  . COLONOSCOPY    . COLONOSCOPY N/A 07/25/2015   Procedure: COLONOSCOPY;  Surgeon: Lollie Sails, MD;  Location: [facility] ENDOSCOPY;  Service: Endoscopy;  Laterality: N/A;  . COLONOSCOPY WITH PROPOFOL N/A 10/04/2017   Procedure: COLONOSCOPY WITH PROPOFOL;  Surgeon: Lollie Sails, MD;  Location: [facility] ENDOSCOPY;  Service: Endoscopy;  Laterality: N/A;  . ELECTROMAGNETIC NAVIGATION BROCHOSCOPY Left 06/28/2016   Procedure: ELECTROMAGNETIC NAVIGATION BRONCHOSCOPY;  Surgeon: Vilinda Boehringer, MD;  Location: [facility] ORS;  Service: Cardiopulmonary;  Laterality: Left;  . ESOPHAGOGASTRODUODENOSCOPY N/A 07/25/2015   Procedure: ESOPHAGOGASTRODUODENOSCOPY (EGD);  Surgeon: Lollie Sails, MD;  Location: [facility] ENDOSCOPY;  Service: Endoscopy;  Laterality: N/A;  . NASAL SINUS SURGERY     x 2   . rotator cuff surgery Right    07/2016  . SEPTOPLASTY    . SKIN GRAFT      Home Medications:  Allergies as of 01/11/2018      Reactions   Lisinopril Other (See Comments)   Varenicline Other (See Comments)      Medication List        Accurate as of 01/11/18 11:59 PM. Always use your most recent med list.          albuterol 108 (90 Base) MCG/ACT inhaler Commonly known as:  PROAIR HFA Inhale 2 puffs into the lungs every 6 (six) hours as needed for wheezing or shortness of breath.   aspirin EC 81 MG tablet Take 81 mg by mouth daily.   atorvastatin 40 MG tablet Commonly known as:  LIPITOR Take 1 tablet by mouth daily.   diazepam 5 MG tablet Commonly known as:  VALIUM Take 5 mg by mouth at bedtime as needed for anxiety.   DULoxetine 60 MG capsule Commonly known as:  CYMBALTA Take by mouth daily.   finasteride 5 MG tablet Commonly  known as:  PROSCAR Take 1 tablet (5 mg total) by mouth daily.   fluticasone  50 MCG/ACT nasal spray Commonly known as:  FLONASE Place 1 spray into both nostrils daily.   fluticasone furoate-vilanterol 200-25 MCG/INH Aepb Commonly known as:  BREO ELLIPTA Inhale 1 puff into the lungs daily.   ipratropium-albuterol 0.5-2.5 (3) MG/3ML Soln Commonly known as:  DUONEB Take 3 mLs by nebulization every 6 (six) hours.   metoprolol tartrate 25 MG tablet Commonly known as:  LOPRESSOR Take 25 mg by mouth 2 (two) times daily.   nitroGLYCERIN 0.4 MG SL tablet Commonly known as:  NITROSTAT Place under the tongue.   OXYGEN Inhale into the lungs. 2 units at night   pantoprazole 40 MG tablet Commonly known as:  PROTONIX Take 40 mg by mouth daily.   potassium chloride 20 MEQ packet Commonly known as:  KLOR-CON Take 20 mEq by mouth daily.   Tiotropium Bromide Monohydrate 2.5 MCG/ACT Aers Commonly known as:  SPIRIVA RESPIMAT Inhale 2 puffs into the lungs daily.       Allergies:  Allergies  Allergen Reactions  . Lisinopril Other (See Comments)  . Varenicline Other (See Comments)    Family History: Family History  Problem Relation Age of Onset  . Heart disease Father   . Prostate cancer Father   . Heart disease Paternal Grandmother   . Heart attack Maternal Grandfather 52  . Kidney cancer Neg Hx   . Bladder Cancer Neg Hx     Social History:  reports that he has been smoking cigarettes.  He has a 67.50 pack-year smoking history. He has never used smokeless tobacco. He reports that he drinks about 1.2 oz of alcohol per week. He reports that he does not use drugs.  ROS: UROLOGY Frequent Urination?: Yes Hard to postpone urination?: No Burning/pain with urination?: Yes Get up at night to urinate?: No Leakage of urine?: No Urine stream starts and stops?: No Trouble starting stream?: Yes Do you have to strain to urinate?: No Blood in urine?: No Urinary tract infection?:  No Sexually transmitted disease?: No Injury to kidneys or bladder?: No Painful intercourse?: No Weak stream?: No Erection problems?: No Penile pain?: No  Gastrointestinal Nausea?: No Vomiting?: No Indigestion/heartburn?: No Diarrhea?: No Constipation?: No  Constitutional Fever: Yes Night sweats?: No Weight loss?: No Fatigue?: No  Skin Skin rash/lesions?: No Itching?: No  Eyes Double vision?: No  Ears/Nose/Throat Sore throat?: No Sinus problems?: Yes  Hematologic/Lymphatic Swollen glands?: No Easy bruising?: No  Cardiovascular Leg swelling?: No Chest pain?: No  Respiratory Cough?: Yes Shortness of breath?: No  Endocrine Excessive thirst?: Yes  Musculoskeletal Back pain?: No Joint pain?: No  Neurological Headaches?: No Dizziness?: No  Psychologic Depression?: No Anxiety?: Yes  Physical Exam: BP (!) 148/84   Pulse 89   Ht 6' (1.829 m)   Wt 198 lb (89.8 kg)   BMI 26.85 kg/m   Constitutional: Well nourished. Alert and oriented, No acute distress. HEENT: [place] AT, moist mucus membranes. Trachea midline, no masses. Cardiovascular: No clubbing, cyanosis, or edema. Respiratory: Normal respiratory effort, no increased work of breathing. Skin: No rashes, bruises or suspicious lesions. Lymph: No cervical or inguinal adenopathy. Neurologic: Grossly intact, no focal deficits, moving all 4 extremities. Psychiatric: Normal mood and affect.  Left Breast exam:  No nipple discharge.  No palpable breast lumps.  No axilla adenopathy.    Laboratory Data: PSA History  0.8 ng/mL on 04/22/2014  1.0 ng/mL on 10/21/2014  1.07 ng/mL on 08/18/2016  1.1 ng/mL on 07/08/2017  Lab Results  Component Value Date   WBC 9.5  09/28/2017   HGB 14.8 09/28/2017   HCT 42.3 09/28/2017   MCV 97.5 09/28/2017   PLT 265 09/28/2017    Lab Results  Component Value Date   CREATININE 0.86 02/04/2017   Pertinent imaging Results for MAGNUS, CRESCENZO (MRN [id number]) as of  01/30/2018 12:22  Ref. Range 01/11/2018 00:00  Scan Result Unknown 65    Assessment & Plan:    1. Testosterone deficiency  Patient's testosterone level continues to increase although he is not on testosterone therapy at this time Complaint of nipple soreness - normal breast exam - prolactin is checked and not elevated - ? Side effect of finasteride - continue to monitor FSH/LU continue to increase  Refer to endocrinologist for further evaluation    2. BPH with LUTS  - IPSS score is 17/3, it is worsening  - Continue conservative management, avoiding bladder irritants and timed voiding's - Encouraged patient to avoid [employer]  - patient could not tolerate alpha blockers due to hypotension  - did not feel finasteride helped - still having night time symptoms and daytime frequency  - offered cystoscopy to evaluate for BOO, but patient deferred  - will have a retrial  Myrbetriq 25 mg daily - Myrbetriq denied by the insurance - sent in script for Enablex 7.5 mg daily   - RTC in 6 weeks for I PSS and PVR  3. Erectile dysfunction:     -On nitroglycerin products so not a candidate for PDE 5 inhibitors - not interested in intracavernosal injections at this time   4. Peyronie's disease  - Curvature is not greater than 30 degrees at this time    5. Sleep apnea  - Patient now on oxygen at night - states the cannula falls off a lot  - advised patient that his night time symptoms may not improve without full time oxygen  Return in about 6 weeks (around 02/22/2018) for  IPSS and PVR.  These notes generated with voice recognition software. I apologize for typographical errors.  Zara Council, [facility] [address] [place], [place] [address] [phone]

## 2018-01-11 ENCOUNTER — Ambulatory Visit: Payer: BLUE CROSS/BLUE SHIELD | Admitting: Urology

## 2018-01-11 ENCOUNTER — Encounter: Payer: Self-pay | Admitting: Urology

## 2018-01-11 VITALS — BP 148/84 | HR 89 | Ht 72.0 in | Wt 198.0 lb

## 2018-01-11 DIAGNOSIS — N644 Mastodynia: Secondary | ICD-10-CM

## 2018-01-11 DIAGNOSIS — R350 Frequency of micturition: Secondary | ICD-10-CM

## 2018-01-11 DIAGNOSIS — N138 Other obstructive and reflux uropathy: Secondary | ICD-10-CM | POA: Diagnosis not present

## 2018-01-11 DIAGNOSIS — E349 Endocrine disorder, unspecified: Secondary | ICD-10-CM | POA: Diagnosis not present

## 2018-01-11 DIAGNOSIS — N401 Enlarged prostate with lower urinary tract symptoms: Secondary | ICD-10-CM

## 2018-01-11 LAB — BLADDER SCAN AMB NON-IMAGING: SCAN RESULT: 65

## 2018-01-12 ENCOUNTER — Telehealth: Payer: Self-pay

## 2018-01-12 DIAGNOSIS — E291 Testicular hypofunction: Secondary | ICD-10-CM

## 2018-01-12 LAB — FSH/LH
FSH: 20.1 m[IU]/mL — AB (ref 1.5–12.4)
LH: 24.4 m[IU]/mL — AB (ref 1.7–8.6)

## 2018-01-12 LAB — PROLACTIN: PROLACTIN: 5.4 ng/mL (ref 4.0–15.2)

## 2018-01-12 LAB — TESTOSTERONE: Testosterone: 442 ng/dL (ref 264–916)

## 2018-01-12 NOTE — Telephone Encounter (Signed)
His testosterone levels are normal.  His levels are actually higher than they were the last time we checked his levels.  This would be unusual for someone who used to be on testosterone therapy and has discontinued the therapy.  I think we need to refer him to endocrinology.

## 2018-01-12 NOTE — Telephone Encounter (Signed)
Spoke with pt in reference to taking testosterone or clomid. Pt stated that he has not had any testosterone since December. Pt stated that he was never give clomid and has only had androgel and testosterone cypionate. Pt voiced concern. Please advise.

## 2018-01-12 NOTE — Telephone Encounter (Signed)
-----   Message from Nori Riis, PA-C sent at 01/12/2018  7:22 AM EDT ----- Is patient taking any type of testosterone or Clomid?

## 2018-01-13 NOTE — Telephone Encounter (Signed)
Spoke with pt in reference to testosterone levels and needing referral to endo. Pt voiced understanding. Referral placed.

## 2018-01-16 ENCOUNTER — Telehealth: Payer: Self-pay

## 2018-01-16 NOTE — Telephone Encounter (Signed)
PA for myrbetriq denied. Insurance wants pt to try and fail one of following: darifenacin ER, oxybutynin, tolterodine, and/or trospium. Please advise.

## 2018-01-30 ENCOUNTER — Other Ambulatory Visit: Payer: Self-pay | Admitting: Urology

## 2018-01-30 MED ORDER — DARIFENACIN HYDROBROMIDE ER 7.5 MG PO TB24: 7.5000 mg | ORAL_TABLET | Freq: Every day | ORAL | 3 refills | Status: DC

## 2018-01-30 NOTE — Progress Notes (Unsigned)
Please schedule for follow up and check on endo referal thanks

## 2018-01-30 NOTE — Progress Notes (Unsigned)
I have sent a prescription for Enablex 7.5 mg daily to the [facility] in [place].  We will need to see him in 6 weeks for a PVR and I PSS.  Has he had his endocrinology appointment?

## 2018-01-31 ENCOUNTER — Telehealth: Payer: Self-pay | Admitting: Urology

## 2018-01-31 NOTE — Telephone Encounter (Signed)
He has an appointment scheduled for endo on 02-16-18 @ 9:15 with Sherman   michelle

## 2018-02-01 ENCOUNTER — Telehealth: Payer: Self-pay | Admitting: *Deleted

## 2018-02-01 DIAGNOSIS — Z122 Encounter for screening for malignant neoplasm of respiratory organs: Secondary | ICD-10-CM

## 2018-02-01 DIAGNOSIS — Z87891 Personal history of nicotine dependence: Secondary | ICD-10-CM

## 2018-02-01 NOTE — Telephone Encounter (Signed)
Notified patient that annual lung cancer screening low dose CT scan is due currently or will be in near future. Confirmed that patient is within the age range of 55-77, and asymptomatic, (no signs or symptoms of lung cancer). Patient denies illness that would prevent curative treatment for lung cancer if found. Verified smoking history, (114.5). The shared decision making visit was done 03/14/15. Patient is agreeable for CT scan being scheduled.

## 2018-02-16 ENCOUNTER — Encounter: Payer: Self-pay | Admitting: Endocrinology

## 2018-02-16 ENCOUNTER — Ambulatory Visit: Payer: BLUE CROSS/BLUE SHIELD | Admitting: Endocrinology

## 2018-02-16 ENCOUNTER — Telehealth: Payer: Self-pay | Admitting: Endocrinology

## 2018-02-16 DIAGNOSIS — E237 Disorder of pituitary gland, unspecified: Secondary | ICD-10-CM | POA: Diagnosis not present

## 2018-02-16 DIAGNOSIS — K121 Other forms of stomatitis: Secondary | ICD-10-CM

## 2018-02-16 LAB — BASIC METABOLIC PANEL
BUN: 7 mg/dL (ref 6–23)
CHLORIDE: 97 meq/L (ref 96–112)
CO2: 29 meq/L (ref 19–32)
Calcium: 9.2 mg/dL (ref 8.4–10.5)
Creatinine, Ser: 0.77 mg/dL (ref 0.40–1.50)
GFR: 109.29 mL/min (ref >60.00)
Glucose, Bld: 77 mg/dL (ref 70–99)
Potassium: 4.3 mEq/L (ref 3.5–5.1)
SODIUM: 132 meq/L — AB (ref 135–145)

## 2018-02-16 LAB — T4, FREE: FREE T4: 0.95 ng/dL (ref 0.60–1.60)

## 2018-02-16 LAB — TSH: TSH: 0.92 u[IU]/mL (ref 0.35–4.50)

## 2018-02-16 NOTE — Progress Notes (Signed)
Subjective:    Patient ID: Paul Carpenter, male    DOB: 28-Dec-1955, 62 y.o.   MRN: [id number]  HPI Pt is referred by Dr Ouida Sills, for hypogonadism.  Pt reports he had puberty at the normal age.  He has 3 biological children.  He was found to have a low testosterone in 2012.  He stopped in 2018, due to polycythemia.  says he has never taken illicit androgens.  He does not take antiandrogens or opioids.  He denies any h/o infertility, XRT, or genital infection.  He has never had surgery, or a serious injury to the head or genital area. He has no h/o sleep apnea or DVT.   He does not consume alcohol excessively.  He takes nocturnal 02, due to sleep apnea.  He takes finasteride, for BPH.  He has slight unsteadiness of the feet, and assoc tremor.   Past Medical History:  Diagnosis Date  . Anginal pain ([place])   . Anxiety   . Chest pain   . Chicken pox   . Complication of anesthesia    o2 dropped after neck fusion  . COPD (chronic obstructive pulmonary disease) ([place])   . Coronary artery disease   . Cough    chronic  clear phlegm  . Dysrhythmia   . GERD (gastroesophageal reflux disease)    h/o reflux/ hoarsness  . Hematochezia   . Hemorrhoids   . History of chickenpox   . History of colon polyps   . History of Helicobacter pylori infection   . Hoarseness   . Hypertension   . Migraines   . OSA (obstructive sleep apnea)    has CPAP but does not use  . Personal history of tobacco use, presenting hazards to health 03/05/2016  . Pneumonia    5/17  . Raynaud disease   . Raynaud disease   . Raynaud's disease   . Rotator cuff tear    on right  . Shortness of breath dyspnea   . Sleep apnea   . Ulcer (traumatic) of oral mucosa     Past Surgical History:  Procedure Laterality Date  . BACK SURGERY     cervical fusion x 2  . CARDIAC CATHETERIZATION    . CERVICAL DISCECTOMY     x 2  . COLONOSCOPY    . COLONOSCOPY N/A 07/25/2015   Procedure: COLONOSCOPY;  Surgeon: Lollie Sails, MD;   Location: [facility] ENDOSCOPY;  Service: Endoscopy;  Laterality: N/A;  . COLONOSCOPY WITH PROPOFOL N/A 10/04/2017   Procedure: COLONOSCOPY WITH PROPOFOL;  Surgeon: Lollie Sails, MD;  Location: [facility] ENDOSCOPY;  Service: Endoscopy;  Laterality: N/A;  . ELECTROMAGNETIC NAVIGATION BROCHOSCOPY Left 06/28/2016   Procedure: ELECTROMAGNETIC NAVIGATION BRONCHOSCOPY;  Surgeon: Vilinda Boehringer, MD;  Location: [facility] ORS;  Service: Cardiopulmonary;  Laterality: Left;  . ESOPHAGOGASTRODUODENOSCOPY N/A 07/25/2015   Procedure: ESOPHAGOGASTRODUODENOSCOPY (EGD);  Surgeon: Lollie Sails, MD;  Location: [facility] ENDOSCOPY;  Service: Endoscopy;  Laterality: N/A;  . NASAL SINUS SURGERY     x 2   . rotator cuff surgery Right    07/2016  . SEPTOPLASTY    . SKIN GRAFT      Social History   Socioeconomic History  . Marital status: Married    Spouse name: Not on file  . Number of children: 3  . Years of education: Not on file  . Highest education level: Not on file  Occupational History  . Occupation: Architect Work  Scientific laboratory technician  . Financial resource strain: Not on  file  . Food insecurity:    Worry: Not on file    Inability: Not on file  . Transportation needs:    Medical: Not on file    Non-medical: Not on file  Tobacco Use  . Smoking status: Current Every Day Smoker    Packs/day: 1.50    Years: 45.00    Pack years: 67.50    Types: Cigarettes  . Smokeless tobacco: Never Used  Substance and Sexual Activity  . Alcohol use: Yes    Alcohol/week: 1.2 oz    Types: 2 Standard drinks or equivalent per week    Comment: moderate  . Drug use: No  . Sexual activity: Not on file  Lifestyle  . Physical activity:    Days per week: Not on file    Minutes per session: Not on file  . Stress: Not on file  Relationships  . Social connections:    Talks on phone: Not on file    Gets together: Not on file    Attends religious service: Not on file    Active member of club or organization: Not on file     Attends meetings of clubs or organizations: Not on file    Relationship status: Not on file  . Intimate partner violence:    Fear of current or ex partner: Not on file    Emotionally abused: Not on file    Physically abused: Not on file    Forced sexual activity: Not on file  Other Topics Concern  . Not on file  Social History Narrative  . Not on file    Current Outpatient Medications on File Prior to Visit  Medication Sig Dispense Refill  . albuterol (PROAIR HFA) 108 (90 Base) MCG/ACT inhaler Inhale 2 puffs into the lungs every 6 (six) hours as needed for wheezing or shortness of breath. 1 Inhaler 3  . aspirin EC 81 MG tablet Take 81 mg by mouth daily.     Marland Kitchen atorvastatin (LIPITOR) 40 MG tablet Take 1 tablet by mouth daily.    Marland Kitchen darifenacin (ENABLEX) 7.5 MG 24 hr tablet Take 1 tablet (7.5 mg total) by mouth daily. 30 tablet 3  . diazepam (VALIUM) 5 MG tablet Take 5 mg by mouth at bedtime as needed for anxiety.     . DULoxetine (CYMBALTA) 60 MG capsule Take by mouth daily.     . finasteride (PROSCAR) 5 MG tablet Take 1 tablet (5 mg total) by mouth daily. 90 tablet 3  . fluticasone (FLONASE) 50 MCG/ACT nasal spray Place 1 spray into both nostrils daily.    . fluticasone furoate-vilanterol (BREO ELLIPTA) 200-25 MCG/INH AEPB Inhale 1 puff into the lungs daily. 60 each 6  . ipratropium-albuterol (DUONEB) 0.5-2.5 (3) MG/3ML SOLN Take 3 mLs by nebulization every 6 (six) hours. 360 mL 5  . metoprolol tartrate (LOPRESSOR) 25 MG tablet Take 25 mg by mouth 2 (two) times daily.    . nitroGLYCERIN (NITROSTAT) 0.4 MG SL tablet Place under the tongue.    . OXYGEN Inhale into the lungs. 2 units at night    . pantoprazole (PROTONIX) 40 MG tablet Take 40 mg by mouth daily.  3  . potassium chloride (KLOR-CON) 20 MEQ packet Take 20 mEq by mouth daily.     . Tiotropium Bromide Monohydrate (SPIRIVA RESPIMAT) 2.5 MCG/ACT AERS Inhale 2 puffs into the lungs daily. 4 g 6  . [DISCONTINUED] mirabegron ER  (MYRBETRIQ) 25 MG TB24 tablet Take 1 tablet (25 mg total) by mouth  daily. 30 tablet 1   No current facility-administered medications on file prior to visit.     Allergies  Allergen Reactions  . Lisinopril Other (See Comments)  . Varenicline Other (See Comments)    Family History  Problem Relation Age of Onset  . Heart disease Father   . Prostate cancer Father   . Heart disease Paternal Grandmother   . Heart attack Maternal Grandfather 52  . Kidney cancer Neg Hx   . Bladder Cancer Neg Hx   . Other Neg Hx        pituitary abnormality    BP 124/80   Pulse 84   Resp 16   Wt 197 lb 3.2 oz (89.4 kg)   SpO2 97%   BMI 26.75 kg/m   Review of Systems denies depression, numbness, weight change, decreased urinary stream, gynecomastia, muscle weakness, fever, headache, easy bruising, rash, rhinorrhea, and chest pain.  He has loss of peripheral vision.  He has ED sxs.  He has a chronic smoker's cough.      Objective:   Physical Exam VS: see vs page GEN: no distress HEAD: head: no deformity eyes: no periorbital swelling, no proptosis external nose and ears are normal mouth: 1 cm raised nodule at the stem of the uvula.   NECK: supple, thyroid is not enlarged CHEST WALL: no deformity LUNGS: clear to auscultation BREASTS:  No gynecomastia CV: reg rate and rhythm, no murmur ABD: abdomen is soft, nontender.  no hepatosplenomegaly.  not distended.  no hernia GENITALIA:  Normal male.   MUSCULOSKELETAL: muscle bulk and strength are grossly normal.  no obvious joint swelling.  gait is normal and steady EXTEMITIES: no deformity.  no ulcer on the feet.  feet are of normal color and temp.  no leg edema PULSES: dorsalis pedis intact bilat.  no carotid bruit NEURO:  cn 2-12 grossly intact.   readily moves all 4's.  sensation is intact to touch on the feet. SKIN:  Normal texture and temperature.  No rash or suspicious lesion is visible.  Normal hair distribution.  NODES:  None palpable at the  neck PSYCH: alert, well-oriented.  Does not appear anxious nor depressed.   Lab Results  Component Value Date   TESTOSTERONE 442 01/11/2018   I have reviewed outside records, and summarized: Pt was noted to have elevated FSH and LH, and referred here.  Other probs were addressed: CAD, PAD, HTN, CB, and OSA.      Assessment & Plan:  Hypogonadism, uncertain etiology. Normal level off rx.  elev gonadotropins, ? due to finasteride.  Check alpha-subunit.  Visual loss. This raises ? of pituitary abnormality. Oral nodule: new.  Incidentally noted.    Patient Instructions  Please see an ear-nose-throat specialist.  you will receive a phone call, about a day and time for an appointment. blood tests are requested for you today.  We'll let you know about the results. Let's check the MRI of the pituitary.  you will receive a phone call, about a day and time for an appointment.

## 2018-02-16 NOTE — Patient Instructions (Addendum)
Please see an ear-nose-throat specialist.  you will receive a phone call, about a day and time for an appointment. blood tests are requested for you today.  We'll let you know about the results. Let's check the MRI of the pituitary.  you will receive a phone call, about a day and time for an appointment.

## 2018-02-16 NOTE — Telephone Encounter (Signed)
OK 

## 2018-02-16 NOTE — Telephone Encounter (Signed)
Patient stated he wanted to go to ENT at Dr Pryor Ochoa for the sores in his mouth, [address], [place], [place] [address]  Phone: [phone]

## 2018-02-16 NOTE — Telephone Encounter (Signed)
Patient needs referral for this, but referrals are so behind. Please advise?

## 2018-02-20 NOTE — Telephone Encounter (Signed)
I spoke with patient & notified him that referral was sent in.

## 2018-02-22 LAB — ALPHA SUBUNIT (FREE): Alpha Subunit (Free): 0.29 ng/mL

## 2018-02-28 NOTE — Telephone Encounter (Signed)
Rx for Darifenacin Hydrobromide ER 7.5MG  ER, must have prior authorization, submitted today via covermymeds.

## 2018-03-03 NOTE — Telephone Encounter (Signed)
Pt insurance will not cover Darifenacin Hydrobromide.   Pt must try Oxybutynin, Oxybutynin ER, Tolterodine, Tolterodine ER, Trospium, or Trospium ER.   Please advise

## 2018-03-03 NOTE — Telephone Encounter (Signed)
We can prescribe oxybutynin ER 10 mg daily.

## 2018-03-06 ENCOUNTER — Other Ambulatory Visit: Payer: Self-pay

## 2018-03-06 MED ORDER — OXYBUTYNIN CHLORIDE ER 10 MG PO TB24: 10.0000 mg | ORAL_TABLET | Freq: Every day | ORAL | 0 refills | Status: DC

## 2018-03-06 NOTE — Progress Notes (Signed)
Talked w/ pt about his medications. He really wants to be on Myrbetriq but insurance will not cover. He bought the Enablex, but states it is not helping. Pt will try Oxybutynin 10mg  and if it does not help, we will attempt the pa for myrbetriq again.

## 2018-03-08 ENCOUNTER — Other Ambulatory Visit: Payer: Self-pay | Admitting: Urology

## 2018-03-08 MED ORDER — OXYBUTYNIN CHLORIDE ER 10 MG PO TB24: 10.0000 mg | ORAL_TABLET | Freq: Every day | ORAL | 1 refills | Status: DC

## 2018-03-08 NOTE — Addendum Note (Signed)
Addended by: Kyra Manges on: 03/08/2018 04:12 PM   Modules accepted: Orders

## 2018-03-09 NOTE — Telephone Encounter (Signed)
Patient is current smoker, with 114.5 pack year history.

## 2018-03-10 ENCOUNTER — Ambulatory Visit
Admission: RE | Admit: 2018-03-10 | Discharge: 2018-03-10 | Disposition: A | Discharge status: Home / Self Care | Payer: BLUE CROSS/BLUE SHIELD | Source: Ambulatory Visit | Attending: Oncology | Admitting: Oncology

## 2018-03-10 ENCOUNTER — Inpatient Hospital Stay: Admission: RE | Admit: 2018-03-10 | Payer: BLUE CROSS/BLUE SHIELD | Source: Ambulatory Visit

## 2018-03-10 DIAGNOSIS — N200 Calculus of kidney: Secondary | ICD-10-CM | POA: Diagnosis not present

## 2018-03-10 DIAGNOSIS — I700 Atherosclerosis of aorta: Secondary | ICD-10-CM | POA: Insufficient documentation

## 2018-03-10 DIAGNOSIS — Z87891 Personal history of nicotine dependence: Secondary | ICD-10-CM | POA: Diagnosis not present

## 2018-03-10 DIAGNOSIS — R918 Other nonspecific abnormal finding of lung field: Secondary | ICD-10-CM | POA: Diagnosis not present

## 2018-03-10 DIAGNOSIS — J432 Centrilobular emphysema: Secondary | ICD-10-CM | POA: Insufficient documentation

## 2018-03-10 DIAGNOSIS — Z122 Encounter for screening for malignant neoplasm of respiratory organs: Secondary | ICD-10-CM | POA: Diagnosis present

## 2018-03-10 DIAGNOSIS — I2510 Atherosclerotic heart disease of native coronary artery without angina pectoris: Secondary | ICD-10-CM | POA: Insufficient documentation

## 2018-03-13 ENCOUNTER — Other Ambulatory Visit: Payer: Self-pay | Admitting: Oncology

## 2018-03-13 ENCOUNTER — Other Ambulatory Visit: Payer: Self-pay | Admitting: *Deleted

## 2018-03-13 ENCOUNTER — Telehealth: Payer: Self-pay | Admitting: *Deleted

## 2018-03-13 DIAGNOSIS — R918 Other nonspecific abnormal finding of lung field: Secondary | ICD-10-CM

## 2018-03-13 NOTE — Telephone Encounter (Signed)
Notified patient of LDCT lung cancer screening program results with recommendation for further evaluation at this time. Patient is aware I will be sending message to Dr. Grayland Ormond with plans for PET scan evaluation and multidisciplinary thoracic conference review this week. Also notified of incidental findings noted below and is encouraged to discuss further with PCP who will receive a copy of this note and/or the CT report. Patient verbalizes understanding.   IMPRESSION: 1. Lung-RADS 4B, suspicious. Interval growth of irregular solid anterior right middle lobe pulmonary nodule measuring 10.7 mm in volume derived mean diameter, which abuts and distorts the minor fissure and is highly suspicious for primary bronchogenic carcinoma. New apical right upper lobe solid 7.9 mm pulmonary nodule. New mild subcarinal lymphadenopathy, cannot exclude metastatic disease. Additional imaging evaluation or consultation with Pulmonology or Thoracic Surgery recommended. PET-CT suggested for further evaluation. 2. Two vessel coronary atherosclerosis. 3. Nonobstructing left nephrolithiasis.  Aortic Atherosclerosis (ICD10-I70.0) and Emphysema (ICD10-J43.9).

## 2018-03-14 ENCOUNTER — Telehealth: Payer: Self-pay | Admitting: Endocrinology

## 2018-03-14 ENCOUNTER — Telehealth: Payer: Self-pay | Admitting: Oncology

## 2018-03-14 NOTE — Telephone Encounter (Signed)
Patient is a 62 year old male who had a low dose screening CT of the chest on Mar 10, 2018 which noted interval growth of an irregular solid lesion in the right middle lobe measuring 10.7 mm.  Patient also had a new right upper lobe lesion measuring 7.9 mm.  Also noted was mild subcarinal lymphadenopathy.  He has a history of heavy tobacco use and was evaluated in lung screening clinic. CT scan is highly suspicious for primary bronchogenic carcinoma.    Patient has been referred to the [facility] and will require a PET scan for further evaluation and to optimize management.  PET scan is also needed to assist in further diagnostic studies including possible biopsy.

## 2018-03-14 NOTE — Telephone Encounter (Signed)
I faxed & received confirmation.

## 2018-03-14 NOTE — Progress Notes (Signed)
03/15/2018 4:36 PM   Paul Carpenter [birth year] [id number]  Referring provider: Kirk Ruths, MD [facility] [place], [place] [address]  Chief Complaint  Patient presents with  . Benign Prostatic Hypertrophy    HPI: Patient is a 62 year old Caucasian male with testosterone deficiency, erectile dysfunction and BPH with LUT S who presents today for follow-up  Testosterone deficiency Patient is still experiencing a decrease in libido, a lack of energy and fatigue.  He is still having spontaneous erections at night.   He is on oxygen at night.   He had discontinued his therapy as he felt no improvement in his symptoms after being at therapeutic levels for 6 months. He is on oxygen at night.  His serum testosterone levels reached 653 ng/dL on 07/08/2017 with the testosterone cypionate.  His last serum testosterone level was 442 ng/dL on 01/11/2018.  His prolactin was 5.4, his FSH was 20.1 and his LH was 24.4.  His hemoglobin is 14.8 and his hematocrit is 42.3% in 09/2017.  He states he is not on any testosterone therapy at this time.  He has also noted left nipple soreness over the last month.  He denies any nipple discharge or breast lumps.  Has been evaluated by endocrinology.  MRI of pituitary gland is pending.  BPH WITH LUTS His IPSS score today is 2, which is mild lower urinary tract symptomatology.  He is mixed with his quality life due to his urinary symptoms.  His PVR is 65 mL   His previous I PSS score was 17/3.  His previous PVR was 92 mL.  His major complaints today are urgency, nocturia and hesitancy.  He has been on finasteride and oxybutynin without help.  He does not feel that it has helped.  These symptoms occur mostly in the evening.  He does wear oxygen at night, but the cannula falls off a lot.  He has had these symptoms for several years.  He denies any dysuria, hematuria or suprapubic pain.  He also denies any recent fevers, chills,  nausea or vomiting.  His father has been diagnosed with non fatal prostate cancer.   IPSS    Row Name 03/15/18 1500         International Prostate Symptom Score   How often have you had the sensation of not emptying your bladder?  Less than half the time     How often have you had to urinate less than every two hours?  About half the time     How often have you found you stopped and started again several times when you urinated?  Less than 1 in 5 times     How often have you found it difficult to postpone urination?  Less than 1 in 5 times     How often have you had a weak urinary stream?  Less than half the time     How often have you had to strain to start urination?  Less than 1 in 5 times     How many times did you typically get up at night to urinate?  3 Times     Total IPSS Score  13       Quality of Life due to urinary symptoms   If you were to spend the rest of your life with your urinary condition just the way it is now how would you feel about that?  Mixed  Score:  1-7 Mild 8-19 Moderate 20-35 Severe   Of note, patient recently underwent a CT of the chest for lung cancer screening due to his smoking history.  A suspicious mass was found and he is being evaluated through the cancer center at this time.  PMH: Past Medical History:  Diagnosis Date  . Anginal pain ([place])   . Anxiety   . Chest pain   . Chicken pox   . Complication of anesthesia    o2 dropped after neck fusion  . COPD (chronic obstructive pulmonary disease) ([place])   . Coronary artery disease   . Cough    chronic  clear phlegm  . Dysrhythmia   . GERD (gastroesophageal reflux disease)    h/o reflux/ hoarsness  . Hematochezia   . Hemorrhoids   . History of chickenpox   . History of colon polyps   . History of Helicobacter pylori infection   . Hoarseness   . Hypertension   . Migraines   . OSA (obstructive sleep apnea)    has CPAP but does not use  . Personal history of tobacco use,  presenting hazards to health 03/05/2016  . Pneumonia    5/17  . Raynaud disease   . Raynaud disease   . Raynaud's disease   . Rotator cuff tear    on right  . Shortness of breath dyspnea   . Sleep apnea   . Ulcer (traumatic) of oral mucosa     Surgical History: Past Surgical History:  Procedure Laterality Date  . BACK SURGERY     cervical fusion x 2  . CARDIAC CATHETERIZATION    . CERVICAL DISCECTOMY     x 2  . COLONOSCOPY    . COLONOSCOPY N/A 07/25/2015   Procedure: COLONOSCOPY;  Surgeon: Lollie Sails, MD;  Location: [facility] ENDOSCOPY;  Service: Endoscopy;  Laterality: N/A;  . COLONOSCOPY WITH PROPOFOL N/A 10/04/2017   Procedure: COLONOSCOPY WITH PROPOFOL;  Surgeon: Lollie Sails, MD;  Location: [facility] ENDOSCOPY;  Service: Endoscopy;  Laterality: N/A;  . ELECTROMAGNETIC NAVIGATION BROCHOSCOPY Left 06/28/2016   Procedure: ELECTROMAGNETIC NAVIGATION BRONCHOSCOPY;  Surgeon: Vilinda Boehringer, MD;  Location: [facility] ORS;  Service: Cardiopulmonary;  Laterality: Left;  . ESOPHAGOGASTRODUODENOSCOPY N/A 07/25/2015   Procedure: ESOPHAGOGASTRODUODENOSCOPY (EGD);  Surgeon: Lollie Sails, MD;  Location: [facility] ENDOSCOPY;  Service: Endoscopy;  Laterality: N/A;  . NASAL SINUS SURGERY     x 2   . rotator cuff surgery Right    07/2016  . SEPTOPLASTY    . SKIN GRAFT      Home Medications:  Allergies as of 03/15/2018      Reactions   Lisinopril Other (See Comments)   Varenicline Other (See Comments)      Medication List        Accurate as of 03/15/18  4:36 PM. Always use your most recent med list.          albuterol 108 (90 Base) MCG/ACT inhaler Commonly known as:  PROAIR HFA Inhale 2 puffs into the lungs every 6 (six) hours as needed for wheezing or shortness of breath.   aspirin EC 81 MG tablet Take 81 mg by mouth daily.   atorvastatin 40 MG tablet Commonly known as:  LIPITOR Take 1 tablet by mouth daily.   diazepam 5 MG tablet Commonly known as:  VALIUM Take 5 mg by mouth  at bedtime as needed for anxiety.   DULoxetine 60 MG capsule Commonly known as:  CYMBALTA Take by mouth daily.  finasteride 5 MG tablet Commonly known as:  PROSCAR Take 1 tablet (5 mg total) by mouth daily.   fluticasone 50 MCG/ACT nasal spray Commonly known as:  FLONASE Place 1 spray into both nostrils daily.   fluticasone furoate-vilanterol 200-25 MCG/INH Aepb Commonly known as:  BREO ELLIPTA Inhale 1 puff into the lungs daily.   ipratropium-albuterol 0.5-2.5 (3) MG/3ML Soln Commonly known as:  DUONEB Take 3 mLs by nebulization every 6 (six) hours.   metoprolol tartrate 25 MG tablet Commonly known as:  LOPRESSOR Take 25 mg by mouth 2 (two) times daily.   mirabegron ER 50 MG Tb24 tablet Commonly known as:  MYRBETRIQ Take 1 tablet (50 mg total) by mouth daily.   nitroGLYCERIN 0.4 MG SL tablet Commonly known as:  NITROSTAT Place under the tongue.   oxybutynin 10 MG 24 hr tablet Commonly known as:  DITROPAN-XL Take 1 tablet (10 mg total) by mouth daily.   OXYGEN Inhale into the lungs. 2 units at night   pantoprazole 40 MG tablet Commonly known as:  PROTONIX Take 40 mg by mouth daily.   potassium chloride 20 MEQ packet Commonly known as:  KLOR-CON Take 20 mEq by mouth daily.   Tiotropium Bromide Monohydrate 2.5 MCG/ACT Aers Commonly known as:  SPIRIVA RESPIMAT Inhale 2 puffs into the lungs daily.       Allergies:  Allergies  Allergen Reactions  . Lisinopril Other (See Comments)  . Varenicline Other (See Comments)    Family History: Family History  Problem Relation Age of Onset  . Heart disease Father   . Prostate cancer Father   . Heart disease Paternal Grandmother   . Heart attack Maternal Grandfather 52  . Kidney cancer Neg Hx   . Bladder Cancer Neg Hx   . Other Neg Hx        pituitary abnormality    Social History:  reports that he has been smoking cigarettes.  He has a 67.50 pack-year smoking history. He has never used smokeless tobacco.  He reports that he drinks about 1.2 oz of alcohol per week. He reports that he does not use drugs.  ROS: UROLOGY Frequent Urination?: Yes Hard to postpone urination?: No Burning/pain with urination?: No Get up at night to urinate?: Yes Leakage of urine?: No Urine stream starts and stops?: No Trouble starting stream?: No Do you have to strain to urinate?: No Blood in urine?: No Urinary tract infection?: No Sexually transmitted disease?: No Injury to kidneys or bladder?: No Painful intercourse?: No Weak stream?: No Erection problems?: No Penile pain?: No  Gastrointestinal Nausea?: No Vomiting?: No Indigestion/heartburn?: No Diarrhea?: No Constipation?: No  Constitutional Fever: No Night sweats?: No Weight loss?: No Fatigue?: No  Skin Skin rash/lesions?: No Itching?: No  Eyes Blurred vision?: Yes Double vision?: No  Ears/Nose/Throat Sore throat?: No Sinus problems?: No  Hematologic/Lymphatic Swollen glands?: No Easy bruising?: No  Cardiovascular Leg swelling?: No Chest pain?: No  Respiratory Cough?: Yes Shortness of breath?: Yes  Endocrine Excessive thirst?: Yes  Musculoskeletal Back pain?: No Joint pain?: No  Neurological Headaches?: No Dizziness?: Yes  Psychologic Depression?: No Anxiety?: No  Physical Exam: BP (!) 143/84   Pulse 82   Wt 199 lb 3.2 oz (90.4 kg)   SpO2 97%   BMI 27.02 kg/m   Constitutional: Well nourished. Alert and oriented, No acute distress. HEENT: [place] AT, moist mucus membranes. Trachea midline, no masses. Cardiovascular: No clubbing, cyanosis, or edema. Respiratory: Normal respiratory effort, no increased work of breathing. Skin:  No rashes, bruises or suspicious lesions. Lymph: No cervical or inguinal adenopathy. Neurologic: Grossly intact, no focal deficits, moving all 4 extremities. Psychiatric: Normal mood and affect.  Laboratory Data: PSA History  0.8 ng/mL on 04/22/2014  1.0 ng/mL on 10/21/2014  1.07  ng/mL on 08/18/2016  1.1 ng/mL on 07/08/2017  Lab Results  Component Value Date   WBC 9.5 09/28/2017   HGB 14.8 09/28/2017   HCT 42.3 09/28/2017   MCV 97.5 09/28/2017   PLT 265 09/28/2017    Lab Results  Component Value Date   CREATININE 0.77 02/16/2018   Pertinent imaging Results  Bladder Scan (Post Void Residual) in office (Order [id number])  Bladder Scan (Post Void Residual) in office  Order: [id number]  Status:  Final result Visible to patient:  No (Not Released) Next appt:  03/16/2018 at 08:30 AM in Radiology ([facility]-PET CT1) Dx:  BPH with obstruction/lower urinary tr...  Component 16:00  Results 280 mL        Last Resulted: 03/15/18 16:00     Lab Flowsheet    Order Details    View Encounter    Lab and Collection Details    Routing    Result History          All Reviewers List   Laneta Simmers on 03/15/2018 16:46  Encounter   View Encounter       Result Information   Status: Final result (Resulted: 03/15/2018 16:00) Provider Status: Reviewed    Order-Level Documents:   There are no order-level documents.     Assessment & Plan:    1. Testosterone deficiency  Has been seen by endocrinology, MRI of pituitary gland is pending  2. BPH with LUTS  - IPSS score is 13/3, it is improving  - Continue conservative management, avoiding bladder irritants and timed voiding's - Encouraged patient to avoid [employer]  - patient could not tolerate alpha blockers due to hypotension  - did not feel finasteride helped - still having night time symptoms and daytime frequency  - offered cystoscopy to evaluate for BOO, but patient deferred  - had a trial of oxybutynin but he did not find it effective in controlling his nocturia  - RTC in 4 months for I PSS, PSA, exam and PVR  3. Peyronie's disease  - Curvature is not greater than 30 degrees at this time   4. Sleep apnea  - Patient now on oxygen at night - states the cannula falls off a lot  - advised  patient that his night time symptoms may not improve without full time oxygen  Return in about 4 months (around 07/16/2018) for IPSS, SHIM, PSA and exam.  These notes generated with voice recognition software. I apologize for typographical errors.  Zara Council, PA-C  [facility] [address] [place],  [address] [phone]

## 2018-03-14 NOTE — Telephone Encounter (Signed)
Kathlee Nations from [facility] need patient  Last office noted. Fax number [phone]

## 2018-03-15 ENCOUNTER — Encounter: Payer: Self-pay | Admitting: Urology

## 2018-03-15 ENCOUNTER — Ambulatory Visit: Payer: BLUE CROSS/BLUE SHIELD | Admitting: Urology

## 2018-03-15 VITALS — BP 143/84 | HR 82 | Wt 199.2 lb

## 2018-03-15 DIAGNOSIS — G4733 Obstructive sleep apnea (adult) (pediatric): Secondary | ICD-10-CM

## 2018-03-15 DIAGNOSIS — N486 Induration penis plastica: Secondary | ICD-10-CM | POA: Diagnosis not present

## 2018-03-15 DIAGNOSIS — N138 Other obstructive and reflux uropathy: Secondary | ICD-10-CM | POA: Diagnosis not present

## 2018-03-15 DIAGNOSIS — N401 Enlarged prostate with lower urinary tract symptoms: Secondary | ICD-10-CM

## 2018-03-15 DIAGNOSIS — E349 Endocrine disorder, unspecified: Secondary | ICD-10-CM

## 2018-03-15 LAB — BLADDER SCAN AMB NON-IMAGING

## 2018-03-15 MED ORDER — MIRABEGRON ER 50 MG PO TB24: 50.0000 mg | ORAL_TABLET | Freq: Every day | ORAL | 3 refills | Status: DC

## 2018-03-16 ENCOUNTER — Encounter
Admission: RE | Admit: 2018-03-16 | Discharge: 2018-03-16 | Disposition: A | Discharge status: Home / Self Care | Payer: BLUE CROSS/BLUE SHIELD | Source: Ambulatory Visit | Attending: Oncology | Admitting: Oncology

## 2018-03-16 ENCOUNTER — Telehealth: Payer: Self-pay | Admitting: *Deleted

## 2018-03-16 ENCOUNTER — Other Ambulatory Visit: Payer: Self-pay | Admitting: *Deleted

## 2018-03-16 ENCOUNTER — Telehealth: Payer: Self-pay | Admitting: Internal Medicine

## 2018-03-16 DIAGNOSIS — R918 Other nonspecific abnormal finding of lung field: Secondary | ICD-10-CM | POA: Insufficient documentation

## 2018-03-16 DIAGNOSIS — R911 Solitary pulmonary nodule: Secondary | ICD-10-CM

## 2018-03-16 DIAGNOSIS — R591 Generalized enlarged lymph nodes: Secondary | ICD-10-CM

## 2018-03-16 LAB — GLUCOSE, CAPILLARY: Glucose-Capillary: 96 mg/dL (ref 65–99)

## 2018-03-16 MED ORDER — FLUDEOXYGLUCOSE F - 18 (FDG) INJECTION
10.1000 | Freq: Once | INTRAVENOUS | Status: AC | PRN
  Administered 2018-03-16: 10.56 via INTRAVENOUS

## 2018-03-16 NOTE — Telephone Encounter (Signed)
Contacted patient and reviewed PET scan results as well as thoracic conference recommendations and expectation of ENB biopsy. Answered questions from patient and he will contact me with any further questions or concerns.

## 2018-03-16 NOTE — Telephone Encounter (Signed)
Pt is returning our call   Please call back

## 2018-03-16 NOTE — Telephone Encounter (Signed)
Spoke with pt and schedule an appt with DS since DK not in office to discuss PET and possible bronch. Nothing further needed.

## 2018-03-17 ENCOUNTER — Ambulatory Visit: Payer: BLUE CROSS/BLUE SHIELD

## 2018-03-21 ENCOUNTER — Ambulatory Visit: Payer: BLUE CROSS/BLUE SHIELD | Admitting: Pulmonary Disease

## 2018-03-21 ENCOUNTER — Encounter: Payer: Self-pay | Admitting: Pulmonary Disease

## 2018-03-21 ENCOUNTER — Other Ambulatory Visit
Admission: RE | Admit: 2018-03-21 | Discharge: 2018-03-21 | Disposition: A | Discharge status: Home / Self Care | Payer: BLUE CROSS/BLUE SHIELD | Source: Ambulatory Visit | Attending: Pulmonary Disease | Admitting: Pulmonary Disease

## 2018-03-21 VITALS — BP 140/88 | HR 68 | Ht 72.0 in | Wt 197.0 lb

## 2018-03-21 DIAGNOSIS — R918 Other nonspecific abnormal finding of lung field: Secondary | ICD-10-CM | POA: Diagnosis not present

## 2018-03-21 DIAGNOSIS — R591 Generalized enlarged lymph nodes: Secondary | ICD-10-CM | POA: Diagnosis not present

## 2018-03-21 DIAGNOSIS — J439 Emphysema, unspecified: Secondary | ICD-10-CM

## 2018-03-21 DIAGNOSIS — Z201 Contact with and (suspected) exposure to tuberculosis: Secondary | ICD-10-CM | POA: Diagnosis not present

## 2018-03-21 DIAGNOSIS — J42 Unspecified chronic bronchitis: Secondary | ICD-10-CM

## 2018-03-21 DIAGNOSIS — F17200 Nicotine dependence, unspecified, uncomplicated: Secondary | ICD-10-CM

## 2018-03-21 MED ORDER — NICOTINE 21 MG/24HR TD PT24: 21.0000 mg | MEDICATED_PATCH | TRANSDERMAL | 12 refills | Status: AC

## 2018-03-21 NOTE — Progress Notes (Signed)
ACUTE PULMONARY OFFICE VISIT  PT PROFILE: 41 M smoker, pt of DK with COPD, chronic hypoxemic respiratory failure (nocturnal oxygen) and with h/o B pulmonary nodules (ENB LUL nodule 06/2016 negative for malignancy, two RUL nodules stable since 03/2015).  Underwent recent LDCT, again demonstrating pulmonary nodules and followed by PET scan with finding as documented below  CHRONIC PULMONARY PROBLEMS: Smoker COPD Nocturnal hypoxemia Multiple pulmonary nodules History of TB exposure (father)  ACUTE PROBLEM: Hypermetabolic subcarinal lymphadenopathy  SUBJ: Patient of DK with the above problems.  He continues to smoke approximately 2 packs cigarettes per day.  He has chronic cough and sputum production.  He has chronic exertional dyspnea.  The symptoms are essentially at his baseline.  Over the past 6 months he has had increasing unsteadiness on his feet and visual disturbance which he describes as tunnel vision.  He has been seen by his primary care physician and and an MRI of brain has been ordered for evaluation of this.  In addition, he underwent recent LDCT screening for lung cancer.  This revealed interval increase in size and and RML pulmonary nodule.  Therefore a PET scan was ordered revealing subcarinal lymphadenopathy which was hypermetabolic.  He is referred for further evaluation of this.  OBJ: Vitals:   03/21/18 0355 03/21/18 0941 03/21/18 0943  BP:  (!) 150/100 140/88  Pulse:  68   SpO2:  97%   Weight: 197 lb (89.4 kg)    Height: 6' (1.829 m)     Gen: NAD HEENT: NCAT, sclera white Neck: No JVD Lungs: breath sounds full and slightly coarse, no wheezes or other adventitious sounds Cardiovascular: RRR, no murmurs Abdomen: Soft, nontender, normal BS Ext: without clubbing, cyanosis, edema Neuro: grossly intact Skin: Limited exam, no lesions noted  DATA: 03/10/18 LDCT chest: Lung-RADS 4B, suspicious. Interval growth of irregular solid anterior right middle lobe pulmonary  nodule measuring 10.7 mm in volume derived mean diameter, which abuts and distorts the minor fissure and is highly suspicious for primary bronchogenic carcinoma. New apical right upper lobe solid 7.9 mm pulmonary nodule. New mild subcarinal lymphadenopathy, cannot exclude metastatic disease 03/16/18 PET: Intensely hypermetabolic subcarinal lymph node consistent with metastatic adenopathy. Presumably the primary lesion is the enlarged R middle lobe lesion with mild metabolic activity. Atypical low activity. New nodule at the R lung apex with faint metabolic activity is concerning for second primary or metastatic lesion. Atypical low activity.   I have personally reviewed all chest radiographs reported above including CXRs and CT chest unless otherwise indicated  IMPRESSION:   ICD-10-CM   1. Smoker F17.200   2. Subcarinal lymphadenopathy, intensely positive on PET scan R59.1 QuantiFERON-TB Gold Plus  3. Pulmonary nodules R91.8 QuantiFERON-TB Gold Plus  4. History of exposure to TB (father) Z20.1 QuantiFERON-TB Gold Plus  5. Chronic bronchitis J42   6. Pulmonary emphysema J43.9    The PET scan reveals a very unusual pattern as the pulmonary nodules are minimally hypermetabolic but the subcarinal node is intensely hypermetabolic.  From a diagnostic point of view, the best approach would be EBUS for biopsy of the subcarinal lymph node.  Since I do not perform this procedure, I will discuss this with Dr. [name].  Of interest, he has had multiple pulmonary nodules dating back several years.  He informs me that his father had tuberculosis when the patient was a child.   PLAN:   Continue Breo and Spiriva inhalers Continue oxygen therapy with sleep Continue DuoNeb as needed I discussed smoking cessation and  recommended nicotine replacement therapy with patches and/or gum QuantiFERON gold ordered today Will arrange for bronchoscopy/EBUS to be performed by Dr. [name]  Procedure including indication and  risks was discussed in detail Follow-up to be arranged by Dr. [name]   I have reviewed this patient's medical problems, current medications and therapies and prior pulmonary office notes in evaluation and formulation of the above assessment and plan  Merton Border, MD PCCM service Mobile [phone] Pager [phone] 03/21/2018 3:31 PM

## 2018-03-21 NOTE — Patient Instructions (Addendum)
Continue Breo and Spiriva Continue DuoNeb nebulizer as needed Smoking cessation as we discussed.  Recommended nicotine replacement therapy.  Prescription for nicotine patches entered  You may also use nicotine gum or lozenges TB blood test ordered for today Bronchoscopy to be performed by Dr. [name] arranged for 6/11  This will require pre-procedure assessment by anesthesia  I will review all scans with him this week

## 2018-03-21 NOTE — H&P (View-Only) (Signed)
ACUTE PULMONARY OFFICE VISIT  PT PROFILE: 58 M smoker, pt of DK with COPD, chronic hypoxemic respiratory failure (nocturnal oxygen) and with h/o B pulmonary nodules (ENB LUL nodule 06/2016 negative for malignancy, two RUL nodules stable since 03/2015).  Underwent recent LDCT, again demonstrating pulmonary nodules and followed by PET scan with finding as documented below  CHRONIC PULMONARY PROBLEMS: Smoker COPD Nocturnal hypoxemia Multiple pulmonary nodules History of TB exposure (father)  ACUTE PROBLEM: Hypermetabolic subcarinal lymphadenopathy  SUBJ: Patient of DK with the above problems.  He continues to smoke approximately 2 packs cigarettes per day.  He has chronic cough and sputum production.  He has chronic exertional dyspnea.  The symptoms are essentially at his baseline.  Over the past 6 months he has had increasing unsteadiness on his feet and visual disturbance which he describes as tunnel vision.  He has been seen by his primary care physician and and an MRI of brain has been ordered for evaluation of this.  In addition, he underwent recent LDCT screening for lung cancer.  This revealed interval increase in size and and RML pulmonary nodule.  Therefore a PET scan was ordered revealing subcarinal lymphadenopathy which was hypermetabolic.  He is referred for further evaluation of this.  OBJ: Vitals:   03/21/18 3267 03/21/18 0941 03/21/18 0943  BP:  (!) 150/100 140/88  Pulse:  68   SpO2:  97%   Weight: 197 lb (89.4 kg)    Height: 6' (1.829 m)     Gen: NAD HEENT: NCAT, sclera white Neck: No JVD Lungs: breath sounds full and slightly coarse, no wheezes or other adventitious sounds Cardiovascular: RRR, no murmurs Abdomen: Soft, nontender, normal BS Ext: without clubbing, cyanosis, edema Neuro: grossly intact Skin: Limited exam, no lesions noted  DATA: 03/10/18 LDCT chest: Lung-RADS 4B, suspicious. Interval growth of irregular solid anterior right middle lobe pulmonary  nodule measuring 10.7 mm in volume derived mean diameter, which abuts and distorts the minor fissure and is highly suspicious for primary bronchogenic carcinoma. New apical right upper lobe solid 7.9 mm pulmonary nodule. New mild subcarinal lymphadenopathy, cannot exclude metastatic disease 03/16/18 PET: Intensely hypermetabolic subcarinal lymph node consistent with metastatic adenopathy. Presumably the primary lesion is the enlarged R middle lobe lesion with mild metabolic activity. Atypical low activity. New nodule at the R lung apex with faint metabolic activity is concerning for second primary or metastatic lesion. Atypical low activity.   I have personally reviewed all chest radiographs reported above including CXRs and CT chest unless otherwise indicated  IMPRESSION:   ICD-10-CM   1. Smoker F17.200   2. Subcarinal lymphadenopathy, intensely positive on PET scan R59.1 QuantiFERON-TB Gold Plus  3. Pulmonary nodules R91.8 QuantiFERON-TB Gold Plus  4. History of exposure to TB (father) Z20.1 QuantiFERON-TB Gold Plus  5. Chronic bronchitis J42   6. Pulmonary emphysema J43.9    The PET scan reveals a very unusual pattern as the pulmonary nodules are minimally hypermetabolic but the subcarinal node is intensely hypermetabolic.  From a diagnostic point of view, the best approach would be EBUS for biopsy of the subcarinal lymph node.  Since I do not perform this procedure, I will discuss this with Dr. [name].  Of interest, he has had multiple pulmonary nodules dating back several years.  He informs me that his father had tuberculosis when the patient was a child.   PLAN:   Continue Breo and Spiriva inhalers Continue oxygen therapy with sleep Continue DuoNeb as needed I discussed smoking cessation and  recommended nicotine replacement therapy with patches and/or gum QuantiFERON gold ordered today Will arrange for bronchoscopy/EBUS to be performed by Dr. [name]  Procedure including indication and  risks was discussed in detail Follow-up to be arranged by Dr. [name]   I have reviewed this patient's medical problems, current medications and therapies and prior pulmonary office notes in evaluation and formulation of the above assessment and plan  Merton Border, MD PCCM service Mobile [phone] Pager [phone] 03/21/2018 3:31 PM

## 2018-03-22 ENCOUNTER — Telehealth: Payer: Self-pay | Admitting: *Deleted

## 2018-03-22 ENCOUNTER — Other Ambulatory Visit: Payer: Self-pay | Admitting: Internal Medicine

## 2018-03-22 ENCOUNTER — Telehealth: Payer: Self-pay | Admitting: Pulmonary Disease

## 2018-03-22 NOTE — Telephone Encounter (Signed)
Spoke with pt and informed him that we aren't able to do the EBUS until 04/11/18 @ 1pm. Informed pt I would call back when I have all of the appts scheduled. Pt verbalized understanding. Nothing further needed at this time:

## 2018-03-22 NOTE — Telephone Encounter (Signed)
Pt and wife informed of PAT appt and procedure date and time with instructions.   DATE: 04/11/18 TIME: 1PM PROCEDURE: EBUS PROVIDER: KASA DX: LUNG NODULE

## 2018-03-22 NOTE — Telephone Encounter (Signed)
Pt wife calling asking about maybe do Bronc this Friday   She is awaiting a call back  Please call back

## 2018-03-23 ENCOUNTER — Telehealth: Payer: Self-pay | Admitting: Internal Medicine

## 2018-03-23 NOTE — Telephone Encounter (Signed)
Patient calling to check on status of results  Please call when available

## 2018-03-23 NOTE — Telephone Encounter (Signed)
When you receive the results of the TB test please advise.

## 2018-03-23 NOTE — Telephone Encounter (Signed)
Called and spoke with Carma Lair at [facility] on 03/23/18 at 9:24 am EST. [id number] and [phone] is a valid and billable code which does not require PA.  Call Ref # 03/23/2018@9 :24amESTperJamilaS. Rhonda J Cobb

## 2018-03-24 LAB — QUANTIFERON-TB GOLD PLUS (RQFGPL)
QUANTIFERON TB1 AG VALUE: 0.02 [IU]/mL
QUANTIFERON TB2 AG VALUE: 0.02 [IU]/mL
QuantiFERON Mitogen Value: >10 IU/mL
QuantiFERON Nil Value: 0.01 IU/mL

## 2018-03-24 LAB — QUANTIFERON-TB GOLD PLUS: QUANTIFERON-TB GOLD PLUS: NEGATIVE

## 2018-03-28 ENCOUNTER — Ambulatory Visit
Admission: RE | Admit: 2018-03-28 | Discharge: 2018-03-28 | Disposition: A | Discharge status: Home / Self Care | Payer: BLUE CROSS/BLUE SHIELD | Source: Ambulatory Visit | Attending: Endocrinology | Admitting: Endocrinology

## 2018-03-28 DIAGNOSIS — R9089 Other abnormal findings on diagnostic imaging of central nervous system: Secondary | ICD-10-CM | POA: Insufficient documentation

## 2018-03-28 DIAGNOSIS — E237 Disorder of pituitary gland, unspecified: Secondary | ICD-10-CM | POA: Diagnosis not present

## 2018-03-28 MED ORDER — GADOBENATE DIMEGLUMINE 529 MG/ML IV SOLN
10.0000 mL | Freq: Once | INTRAVENOUS | Status: AC | PRN
  Administered 2018-03-28: 9 mL via INTRAVENOUS

## 2018-04-06 ENCOUNTER — Encounter
Admission: RE | Admit: 2018-04-06 | Discharge: 2018-04-06 | Disposition: A | Discharge status: Home / Self Care | Payer: BLUE CROSS/BLUE SHIELD | Source: Ambulatory Visit | Attending: Internal Medicine | Admitting: Internal Medicine

## 2018-04-06 ENCOUNTER — Other Ambulatory Visit: Payer: Self-pay

## 2018-04-06 DIAGNOSIS — Z01810 Encounter for preprocedural cardiovascular examination: Secondary | ICD-10-CM | POA: Diagnosis present

## 2018-04-06 DIAGNOSIS — I2510 Atherosclerotic heart disease of native coronary artery without angina pectoris: Secondary | ICD-10-CM | POA: Diagnosis not present

## 2018-04-06 DIAGNOSIS — Z01812 Encounter for preprocedural laboratory examination: Secondary | ICD-10-CM | POA: Diagnosis present

## 2018-04-06 LAB — CBC
HCT: 45.4 % (ref 40.0–52.0)
Hemoglobin: 15.8 g/dL (ref 13.0–18.0)
MCH: 34.8 pg — ABNORMAL HIGH (ref 26.0–34.0)
MCHC: 34.8 g/dL (ref 32.0–36.0)
MCV: 99.9 fL (ref 80.0–100.0)
Platelets: 251 K/uL (ref 150–440)
RBC: 4.55 MIL/uL (ref 4.40–5.90)
RDW: 12.5 % (ref 11.5–14.5)
WBC: 10.7 K/uL — ABNORMAL HIGH (ref 3.8–10.6)

## 2018-04-06 LAB — POTASSIUM: Potassium: 3.9 mmol/L (ref 3.5–5.1)

## 2018-04-06 NOTE — Patient Instructions (Signed)
Your procedure is scheduled on: 04/11/18 Report to Day Surgery. [facility] To find out your arrival time please call [phone] between 1PM - 3PM on 04/10/18  Remember: Instructions that are not followed completely may result in serious medical risk, up to and including death, or upon the discretion of your surgeon and anesthesiologist your surgery may need to be rescheduled.     _X__ 1. Do not eat food after midnight the night before your procedure.                 No gum chewing or hard candies. You may drink clear liquids up to 2 hours                 before you are scheduled to arrive for your surgery- DO not drink clear                 liquids within 2 hours of the start of your surgery.                 Clear Liquids include:  water, apple juice without pulp, clear carbohydrate                 drink such as Clearfast of Gartorade, Black Coffee or Tea (Do not add                 anything to coffee or tea).  __X__2.  On the morning of surgery brush your teeth with toothpaste and water, you                 may rinse your mouth with mouthwash if you wish.  Do not swallow any              toothpaste of mouthwash.     _X__ 3.  No Alcohol for 24 hours before or after surgery.   _X__ 4.  Do Not Smoke or use e-cigarettes For 24 Hours Prior to Your Surgery.                 Do not use any chewable tobacco products for at least 6 hours prior to                 surgery.  ____  5.  Bring all medications with you on the day of surgery if instructed.   _X___  6.  Notify your doctor if there is any change in your medical condition      (cold, fever, infections).     Do not wear jewelry, make-up, hairpins, clips or nail polish. Do not wear lotions, powders, or perfumes. You may wear deodorant. Do not shave 48 hours prior to surgery. Men may shave face and neck. Do not bring valuables to the hospital.    [facility] is not responsible for any belongings or  valuables.  Contacts, dentures or bridgework may not be worn into surgery. Leave your suitcase in the car. After surgery it may be brought to your room. For patients admitted to the hospital, discharge time is determined by your treatment team.   Patients discharged the day of surgery will not be allowed to drive home.    __X__ Take these medicines the morning of surgery with A SIP OF WATER:    1.PANTOPRAZOLE AT BEDTIME 04/10/18 AND MORNING OF SURGERY  2.DIAZEPAM   3.DULOXETINE   4.FINASTERIDE  5.METOPROLOL  6.MIRABEGRON  ____ Fleet Enema (as directed)   ____ Use CHG Soap as directed  ___X_ Use inhalers on the day of surgery   DO DUONEB TREATMENT MORNING OF SURGERY AND BRING ALL INHALERS   ____ Stop metformin 2 days prior to surgery    ____ Take 1/2 of usual insulin dose the night before surgery. No insulin the morning          of surgery.   ____ Stop Coumadin/Plavix/aspirin on  ____ Stop Anti-inflammatories on    ____ Stop supplements until after surgery.    ____ Bring C-Pap to the hospital.

## 2018-04-11 ENCOUNTER — Encounter: Payer: Self-pay | Admitting: *Deleted

## 2018-04-11 ENCOUNTER — Other Ambulatory Visit: Payer: Self-pay

## 2018-04-11 ENCOUNTER — Ambulatory Visit
Admission: RE | Admit: 2018-04-11 | Discharge: 2018-04-11 | Disposition: A | Discharge status: Home / Self Care | Payer: BLUE CROSS/BLUE SHIELD | Source: Ambulatory Visit | Attending: Internal Medicine | Admitting: Internal Medicine

## 2018-04-11 ENCOUNTER — Ambulatory Visit: Payer: BLUE CROSS/BLUE SHIELD | Admitting: Anesthesiology

## 2018-04-11 ENCOUNTER — Encounter
Admission: RE | Disposition: A | Discharge status: Home / Self Care | Payer: Self-pay | Source: Ambulatory Visit | Attending: Internal Medicine

## 2018-04-11 DIAGNOSIS — J449 Chronic obstructive pulmonary disease, unspecified: Secondary | ICD-10-CM | POA: Insufficient documentation

## 2018-04-11 DIAGNOSIS — R221 Localized swelling, mass and lump, neck: Secondary | ICD-10-CM | POA: Diagnosis present

## 2018-04-11 DIAGNOSIS — J9611 Chronic respiratory failure with hypoxia: Secondary | ICD-10-CM | POA: Insufficient documentation

## 2018-04-11 DIAGNOSIS — Z7951 Long term (current) use of inhaled steroids: Secondary | ICD-10-CM | POA: Insufficient documentation

## 2018-04-11 DIAGNOSIS — F419 Anxiety disorder, unspecified: Secondary | ICD-10-CM | POA: Insufficient documentation

## 2018-04-11 DIAGNOSIS — I739 Peripheral vascular disease, unspecified: Secondary | ICD-10-CM | POA: Insufficient documentation

## 2018-04-11 DIAGNOSIS — R591 Generalized enlarged lymph nodes: Secondary | ICD-10-CM | POA: Insufficient documentation

## 2018-04-11 DIAGNOSIS — I10 Essential (primary) hypertension: Secondary | ICD-10-CM | POA: Insufficient documentation

## 2018-04-11 DIAGNOSIS — I25119 Atherosclerotic heart disease of native coronary artery with unspecified angina pectoris: Secondary | ICD-10-CM | POA: Diagnosis not present

## 2018-04-11 DIAGNOSIS — G4730 Sleep apnea, unspecified: Secondary | ICD-10-CM | POA: Insufficient documentation

## 2018-04-11 DIAGNOSIS — K219 Gastro-esophageal reflux disease without esophagitis: Secondary | ICD-10-CM | POA: Diagnosis not present

## 2018-04-11 DIAGNOSIS — R918 Other nonspecific abnormal finding of lung field: Secondary | ICD-10-CM

## 2018-04-11 DIAGNOSIS — Z79899 Other long term (current) drug therapy: Secondary | ICD-10-CM | POA: Insufficient documentation

## 2018-04-11 DIAGNOSIS — C770 Secondary and unspecified malignant neoplasm of lymph nodes of head, face and neck: Secondary | ICD-10-CM | POA: Diagnosis not present

## 2018-04-11 DIAGNOSIS — F17210 Nicotine dependence, cigarettes, uncomplicated: Secondary | ICD-10-CM | POA: Insufficient documentation

## 2018-04-11 HISTORY — PX: ENDOBRONCHIAL ULTRASOUND: SHX5096

## 2018-04-11 SURGERY — ENDOBRONCHIAL ULTRASOUND (EBUS)
Anesthesia: General

## 2018-04-11 MED ORDER — LACTATED RINGERS IV SOLN
INTRAVENOUS | Status: DC
  Administered 2018-04-11: 13:00:00 via INTRAVENOUS

## 2018-04-11 MED ORDER — LIDOCAINE HCL (PF) 2 % IJ SOLN
INTRAMUSCULAR | Status: AC
  Filled 2018-04-11: qty 10

## 2018-04-11 MED ORDER — SUGAMMADEX SODIUM 200 MG/2ML IV SOLN
INTRAVENOUS | Status: DC | PRN
  Administered 2018-04-11: 200 mg via INTRAVENOUS

## 2018-04-11 MED ORDER — SEVOFLURANE IN SOLN
RESPIRATORY_TRACT | Status: AC
  Filled 2018-04-11: qty 250

## 2018-04-11 MED ORDER — ONDANSETRON HCL 4 MG/2ML IJ SOLN
INTRAMUSCULAR | Status: DC | PRN
  Administered 2018-04-11: 4 mg via INTRAVENOUS

## 2018-04-11 MED ORDER — PROPOFOL 10 MG/ML IV BOLUS
INTRAVENOUS | Status: AC
  Filled 2018-04-11: qty 20

## 2018-04-11 MED ORDER — ONDANSETRON HCL 4 MG/2ML IJ SOLN: 4.0000 mg | Freq: Once | INTRAMUSCULAR | Status: DC | PRN

## 2018-04-11 MED ORDER — MIDAZOLAM HCL 2 MG/2ML IJ SOLN
INTRAMUSCULAR | Status: DC | PRN
  Administered 2018-04-11: 2 mg via INTRAVENOUS

## 2018-04-11 MED ORDER — LIDOCAINE HCL (CARDIAC) PF 100 MG/5ML IV SOSY
PREFILLED_SYRINGE | INTRAVENOUS | Status: DC | PRN
  Administered 2018-04-11: 80 mg via INTRAVENOUS

## 2018-04-11 MED ORDER — ONDANSETRON HCL 4 MG/2ML IJ SOLN
INTRAMUSCULAR | Status: AC
  Filled 2018-04-11: qty 2

## 2018-04-11 MED ORDER — SUGAMMADEX SODIUM 200 MG/2ML IV SOLN
INTRAVENOUS | Status: AC
  Filled 2018-04-11: qty 2

## 2018-04-11 MED ORDER — PROPOFOL 10 MG/ML IV BOLUS
INTRAVENOUS | Status: DC | PRN
  Administered 2018-04-11: 180 mg via INTRAVENOUS

## 2018-04-11 MED ORDER — DEXAMETHASONE SODIUM PHOSPHATE 10 MG/ML IJ SOLN
INTRAMUSCULAR | Status: AC
  Filled 2018-04-11: qty 1

## 2018-04-11 MED ORDER — MIDAZOLAM HCL 2 MG/2ML IJ SOLN
INTRAMUSCULAR | Status: AC
  Filled 2018-04-11: qty 2

## 2018-04-11 MED ORDER — FENTANYL CITRATE (PF) 100 MCG/2ML IJ SOLN
INTRAMUSCULAR | Status: AC
  Filled 2018-04-11: qty 2

## 2018-04-11 MED ORDER — FENTANYL CITRATE (PF) 100 MCG/2ML IJ SOLN: 25.0000 ug | INTRAMUSCULAR | Status: DC | PRN

## 2018-04-11 MED ORDER — SUGAMMADEX SODIUM 500 MG/5ML IV SOLN
INTRAVENOUS | Status: AC
  Filled 2018-04-11: qty 5

## 2018-04-11 MED ORDER — FENTANYL CITRATE (PF) 100 MCG/2ML IJ SOLN
INTRAMUSCULAR | Status: DC | PRN
  Administered 2018-04-11: 100 ug via INTRAVENOUS

## 2018-04-11 MED ORDER — ROCURONIUM BROMIDE 50 MG/5ML IV SOLN
INTRAVENOUS | Status: AC
  Filled 2018-04-11: qty 1

## 2018-04-11 MED ORDER — ROCURONIUM BROMIDE 100 MG/10ML IV SOLN
INTRAVENOUS | Status: DC | PRN
  Administered 2018-04-11: 50 mg via INTRAVENOUS

## 2018-04-11 MED ORDER — DEXAMETHASONE SODIUM PHOSPHATE 10 MG/ML IJ SOLN
INTRAMUSCULAR | Status: DC | PRN
Start: 2018-04-11 — End: 2018-04-11
  Administered 2018-04-11: 10 mg via INTRAVENOUS

## 2018-04-11 NOTE — Transfer of Care (Signed)
Immediate Anesthesia Transfer of Care Note  Patient: Paul Carpenter  Procedure(s) Performed: ENDOBRONCHIAL ULTRASOUND (N/A )  Patient Location: PACU  Anesthesia Type:General  Level of Consciousness: awake, alert , oriented and patient cooperative  Airway & Oxygen Therapy: Patient Spontanous Breathing and Patient connected to face mask oxygen  Post-op Assessment: Report given to RN and Post -op Vital signs reviewed and stable  Post vital signs: Reviewed and stable  Last Vitals:  Vitals Value Taken Time  BP 132/82 04/11/2018  2:03 PM  Temp    Pulse 87 04/11/2018  2:04 PM  Resp 15 04/11/2018  2:04 PM  SpO2 100 % 04/11/2018  2:04 PM  Vitals shown include unvalidated device data.  Last Pain:  Vitals:   04/11/18 1206  TempSrc: Oral         Complications: No apparent anesthesia complications

## 2018-04-11 NOTE — Interval H&P Note (Signed)
History and Physical Interval Note:  04/11/2018 12:32 PM  Paul Carpenter  has presented today for surgery, with the diagnosis of LUNG NODULE  The various methods of treatment have been discussed with the patient and family. After consideration of risks, benefits and other options for treatment, the patient has consented to  Procedure(s): ENDOBRONCHIAL ULTRASOUND (N/A) as a surgical intervention .  The patient's history has been reviewed, patient examined, no change in status, stable for surgery.  I have reviewed the patient's chart and labs.  Questions were answered to the patient's satisfaction.     [name]

## 2018-04-11 NOTE — Anesthesia Procedure Notes (Signed)
Procedure Name: Intubation Date/Time: 04/11/2018 1:20 PM Performed by: Eben Burow, CRNA Pre-anesthesia Checklist: Patient identified, Emergency Drugs available, Suction available, Patient being monitored and Timeout performed Patient Re-evaluated:Patient Re-evaluated prior to induction Oxygen Delivery Method: Circle system utilized Preoxygenation: Pre-oxygenation with 100% oxygen Induction Type: IV induction Ventilation: Mask ventilation without difficulty Laryngoscope Size: Miller and 2 Grade View: Grade I Tube type: Oral Tube size: 8.5 mm Number of attempts: 1 Airway Equipment and Method: Stylet and LTA kit utilized Placement Confirmation: ETT inserted through vocal cords under direct vision,  positive ETCO2 and breath sounds checked- equal and bilateral Secured at: 24 cm Tube secured with: Tape Dental Injury: Teeth and Oropharynx as per pre-operative assessment

## 2018-04-11 NOTE — Discharge Instructions (Signed)

## 2018-04-11 NOTE — Op Note (Signed)
PROCEDURE: ENDOBRONCHIAL ULTRASOUND   PROCEDURE DATE: 04/11/2018  TIME:  NAME:  Paul Carpenter  DOB:[birth year]  MRN: [id number] LOC:  ARPO/None    HOSP DAY: @LENGTHOFSTAYDAYS @ CODE STATUS:   Code Status History    Date Active Date Inactive Code Status Order ID Comments User Context   09/09/2016 1436 09/11/2016 1433 Full Code [id number]  Hower, Aaron Mose, MD ED          Indications/Preliminary Diagnosis:subcarinal mass Consent: (Place X beside choice/s below)  The benefits, risks and possible complications of the procedure were        explained to:  _x__ patient  _x__ patient's family  ___ other:___________  who verbalized understanding and gave:  ___ verbal  ___ written  _x__ verbal and written  ___ telephone  ___ other:________ consent.      Unable to obtain consent; procedure performed on emergent basis.     Other:       PRESEDATION ASSESSMENT: History and Physical has been performed. Patient meds and allergies have been reviewed. Presedation airway examination has been performed and documented. Baseline vital signs, sedation score, oxygenation status, and cardiac rhythm were reviewed. Patient was deemed to be in satisfactory condition to undergo the procedure.    PREMEDICATIONS: SEE ANESTHESIOLOGY RECORDS   Insertion Route (Place X beside choice below)   Nasal   Oral  x Endotracheal Tube   Tracheostomy   INTRAPROCEDURE MEDICATIONS: SEE ANESTHESIOLOGY RECORDS   PROCEDURE DETAILS: Timeout performed and correct patient, name, & ID confirmed. Following prep per Pulmonary policy, appropriate sedation was administered.  I proceeded with introducing the endobronchial [place] scope and findings, technical procedures, and specimen collection as noted below. At the end of exam the scope was withdrawn without incident. Impression and Plan as noted below.   SPECIMENS (Sites): (Place X beside choice below)  Specimens Description   No Specimens Obtained     Washings    Lavage    Biopsies   x Fine Needle Aspirates 7 samples   Brushings    Sputum    FINDINGS:  Large 4x4 cm subcarinal mass visualized under [place], it seems to have enlarged over last 3 weeks 7 samples were taken FNA with 21 gauge needle    ESTIMATED BLOOD LOSS: 1cc COMPLICATIONS/RESOLUTION: none       IMPRESSION:POST-PROCEDURE OQ:[id number] mass    RECOMMENDATION/PLAN:  Follow up Pathology Reports     Corrin Parker, M.D.  [facility]  Medical Director [place] Director [place] Department

## 2018-04-11 NOTE — Anesthesia Postprocedure Evaluation (Signed)
Anesthesia Post Note  Patient: Paul Carpenter  Procedure(s) Performed: ENDOBRONCHIAL ULTRASOUND (N/A )  Patient location during evaluation: PACU Anesthesia Type: General Level of consciousness: awake and alert and oriented Pain management: pain level controlled Vital Signs Assessment: post-procedure vital signs reviewed and stable Respiratory status: spontaneous breathing Cardiovascular status: blood pressure returned to baseline Anesthetic complications: no     Last Vitals:  Vitals:   04/11/18 1440 04/11/18 1503  BP: (!) 149/87 (!) 152/89  Pulse: 86 89  Resp: 18 16  Temp: (!) 36.4 C   SpO2: 97% 98%    Last Pain:  Vitals:   04/11/18 1440  TempSrc: Temporal  PainSc: 0-No pain                 Kaliyah Gladman

## 2018-04-11 NOTE — Anesthesia Preprocedure Evaluation (Signed)
Anesthesia Evaluation  Patient identified by MRN, date of birth, ID band Patient awake    Reviewed: Allergy & Precautions, H&P , NPO status , Patient's Chart, lab work & pertinent test results, reviewed documented beta blocker date and time   History of Anesthesia Complications (+) history of anesthetic complications  Airway Mallampati: III  TM Distance: >3 FB Neck ROM: limited    Dental  (+) Teeth Intact   Pulmonary neg pulmonary ROS, shortness of breath and with exertion, sleep apnea and Continuous Positive Airway Pressure Ventilation , pneumonia, resolved, COPD,  COPD inhaler, Current Smoker,    Pulmonary exam normal        Cardiovascular hypertension, + angina with exertion + CAD and + Peripheral Vascular Disease  negative cardio ROS Normal cardiovascular exam+ dysrhythmias  Rhythm:regular Rate:Normal     Neuro/Psych  Headaches, Anxiety  Neuromuscular disease negative neurological ROS  negative psych ROS   GI/Hepatic negative GI ROS, Neg liver ROS, GERD  Medicated,  Endo/Other  negative endocrine ROS  Renal/GU negative Renal ROS  negative genitourinary   Musculoskeletal   Abdominal   Peds  Hematology negative hematology ROS (+)   Anesthesia Other Findings Past Medical History: No date: Anginal pain ([place]) No date: Anxiety No date: Chicken pox No date: Complication of anesthesia     Comment: o2 dropped after neck fusion No date: COPD (chronic obstructive pulmonary disease) (* No date: Coronary artery disease No date: Cough     Comment: chronic  clear phlegm No date: Dysrhythmia No date: GERD (gastroesophageal reflux disease)     Comment: h/o reflux/ hoarsness No date: Hematochezia No date: Hemorrhoids No date: Hoarseness No date: Hypertension No date: Migraines No date: OSA (obstructive sleep apnea)     Comment: has CPAP but does not use 03/05/2016: Personal history of tobacco use, presenting ha* No  date: Pneumonia     Comment: 5/17 No date: Raynaud disease No date: Raynaud's disease No date: Rotator cuff tear     Comment: on right No date: Shortness of breath dyspnea No date: Sleep apnea No date: Ulcer (traumatic) of oral mucosa Past Surgical History: No date: BACK SURGERY     Comment: cervical fusion x 2 No date: CARDIAC CATHETERIZATION No date: CERVICAL DISCECTOMY No date: COLONOSCOPY 07/25/2015: COLONOSCOPY N/A     Comment: Procedure: COLONOSCOPY;  Surgeon: Lollie Sails, MD;  Location: [facility] ENDOSCOPY;                Service: Endoscopy;  Laterality: N/A; 07/25/2015: ESOPHAGOGASTRODUODENOSCOPY N/A     Comment: Procedure: ESOPHAGOGASTRODUODENOSCOPY (EGD);                Surgeon: Lollie Sails, MD;  Location: [facility]              ENDOSCOPY;  Service: Endoscopy;  Laterality:               N/A; No date: NASAL SINUS SURGERY     Comment: x 2  No date: NECK SURGERY No date: SEPTOPLASTY No date: SKIN GRAFT   Reproductive/Obstetrics negative OB ROS                             Anesthesia Physical  Anesthesia Plan  ASA: III  Anesthesia Plan: General ETT   Post-op Pain Management:    Induction:   PONV Risk Score and Plan:   Airway  Management Planned:   Additional Equipment:   Intra-op Plan:   Post-operative Plan:   Informed Consent: I have reviewed the patients History and Physical, chart, labs and discussed the procedure including the risks, benefits and alternatives for the proposed anesthesia with the patient or authorized representative who has indicated his/her understanding and acceptance.   Dental Advisory Given  Plan Discussed with: CRNA  Anesthesia Plan Comments:         Anesthesia Quick Evaluation

## 2018-04-11 NOTE — Anesthesia Post-op Follow-up Note (Signed)
Anesthesia QCDR form completed.        

## 2018-04-12 ENCOUNTER — Encounter: Payer: Self-pay | Admitting: Internal Medicine

## 2018-04-13 ENCOUNTER — Telehealth: Payer: Self-pay | Admitting: Internal Medicine

## 2018-04-13 NOTE — Telephone Encounter (Signed)
Patient advised results have not posted yet.

## 2018-04-13 NOTE — Telephone Encounter (Signed)
No results back yet

## 2018-04-13 NOTE — Telephone Encounter (Signed)
Patient recent biopsy on 6/11 wants to know if results are available or when they may be available.  Please call to discuss expectations.

## 2018-04-14 NOTE — Telephone Encounter (Signed)
I called Paul Carpenter and told him that results are pending  I also had discussion with Dr Grayland Ormond as well  I suggested to be Paul Carpenter and wait for results as sometimes this takes time.   Paul Carpenter is understanding but it seems wife is very anxious.

## 2018-04-14 NOTE — Telephone Encounter (Signed)
Patient wife would even appreciate a call from misty to see if the reason for the delay in the report is that it was sent for further examination to determine results. Patient is worrying excessively

## 2018-04-14 NOTE — Telephone Encounter (Signed)
Patient wife calling on behalf of patient   She states she was supposed to get pathology report within 24 hrs and also Dr. [name] assured the patient and family that he would call personally to discuss results and treatment plan.

## 2018-04-14 NOTE — Telephone Encounter (Signed)
Please call pt on message below ASAP. They are upset.

## 2018-04-18 ENCOUNTER — Ambulatory Visit: Payer: BLUE CROSS/BLUE SHIELD | Admitting: Oncology

## 2018-04-18 ENCOUNTER — Institutional Professional Consult (permissible substitution): Payer: BLUE CROSS/BLUE SHIELD | Admitting: Radiation Oncology

## 2018-04-18 ENCOUNTER — Telehealth: Payer: Self-pay | Admitting: Internal Medicine

## 2018-04-18 ENCOUNTER — Other Ambulatory Visit: Payer: Self-pay | Admitting: *Deleted

## 2018-04-18 DIAGNOSIS — C3490 Malignant neoplasm of unspecified part of unspecified bronchus or lung: Secondary | ICD-10-CM

## 2018-04-18 LAB — CYTOLOGY - NON PAP

## 2018-04-18 NOTE — Telephone Encounter (Signed)
Spoke with Hayley at the [facility] and she states she has spoken with pt and he is aware of his dx and the plan from here. Nothing further needed.

## 2018-04-18 NOTE — Telephone Encounter (Signed)
Pt calling asking about Bronc results  Please call back

## 2018-04-18 NOTE — Telephone Encounter (Signed)
The patient and family will be notified by their oncologist. The tests are still pending to determine what type of cancer

## 2018-04-19 ENCOUNTER — Encounter (INDEPENDENT_AMBULATORY_CARE_PROVIDER_SITE_OTHER): Payer: Self-pay

## 2018-04-19 ENCOUNTER — Other Ambulatory Visit (INDEPENDENT_AMBULATORY_CARE_PROVIDER_SITE_OTHER): Payer: Self-pay | Admitting: Vascular Surgery

## 2018-04-23 MED ORDER — CEFAZOLIN SODIUM-DEXTROSE 2-4 GM/100ML-% IV SOLN
2.0000 g | Freq: Once | INTRAVENOUS | Status: AC
  Administered 2018-04-24: 2 g via INTRAVENOUS

## 2018-04-24 ENCOUNTER — Encounter: Payer: Self-pay | Admitting: Registered Nurse

## 2018-04-24 ENCOUNTER — Ambulatory Visit
Admission: RE | Admit: 2018-04-24 | Discharge: 2018-04-24 | Disposition: A | Discharge status: Home / Self Care | Payer: BLUE CROSS/BLUE SHIELD | Source: Ambulatory Visit | Attending: Vascular Surgery | Admitting: Vascular Surgery

## 2018-04-24 ENCOUNTER — Encounter
Admission: RE | Disposition: A | Discharge status: Home / Self Care | Payer: Self-pay | Source: Ambulatory Visit | Attending: Vascular Surgery

## 2018-04-24 DIAGNOSIS — I7300 Raynaud's syndrome without gangrene: Secondary | ICD-10-CM | POA: Insufficient documentation

## 2018-04-24 DIAGNOSIS — I2510 Atherosclerotic heart disease of native coronary artery without angina pectoris: Secondary | ICD-10-CM | POA: Insufficient documentation

## 2018-04-24 DIAGNOSIS — Z8249 Family history of ischemic heart disease and other diseases of the circulatory system: Secondary | ICD-10-CM | POA: Insufficient documentation

## 2018-04-24 DIAGNOSIS — Z8619 Personal history of other infectious and parasitic diseases: Secondary | ICD-10-CM | POA: Insufficient documentation

## 2018-04-24 DIAGNOSIS — C3491 Malignant neoplasm of unspecified part of right bronchus or lung: Secondary | ICD-10-CM | POA: Insufficient documentation

## 2018-04-24 DIAGNOSIS — Z8042 Family history of malignant neoplasm of prostate: Secondary | ICD-10-CM | POA: Insufficient documentation

## 2018-04-24 DIAGNOSIS — Z981 Arthrodesis status: Secondary | ICD-10-CM | POA: Insufficient documentation

## 2018-04-24 DIAGNOSIS — I10 Essential (primary) hypertension: Secondary | ICD-10-CM | POA: Diagnosis not present

## 2018-04-24 DIAGNOSIS — K219 Gastro-esophageal reflux disease without esophagitis: Secondary | ICD-10-CM | POA: Diagnosis not present

## 2018-04-24 DIAGNOSIS — Z7951 Long term (current) use of inhaled steroids: Secondary | ICD-10-CM | POA: Insufficient documentation

## 2018-04-24 DIAGNOSIS — F17210 Nicotine dependence, cigarettes, uncomplicated: Secondary | ICD-10-CM | POA: Insufficient documentation

## 2018-04-24 DIAGNOSIS — C3490 Malignant neoplasm of unspecified part of unspecified bronchus or lung: Secondary | ICD-10-CM

## 2018-04-24 DIAGNOSIS — Z9981 Dependence on supplemental oxygen: Secondary | ICD-10-CM | POA: Diagnosis not present

## 2018-04-24 DIAGNOSIS — Z888 Allergy status to other drugs, medicaments and biological substances status: Secondary | ICD-10-CM | POA: Insufficient documentation

## 2018-04-24 DIAGNOSIS — F419 Anxiety disorder, unspecified: Secondary | ICD-10-CM | POA: Diagnosis not present

## 2018-04-24 DIAGNOSIS — G4733 Obstructive sleep apnea (adult) (pediatric): Secondary | ICD-10-CM | POA: Diagnosis not present

## 2018-04-24 DIAGNOSIS — Z86010 Personal history of colonic polyps: Secondary | ICD-10-CM | POA: Diagnosis not present

## 2018-04-24 DIAGNOSIS — J449 Chronic obstructive pulmonary disease, unspecified: Secondary | ICD-10-CM | POA: Insufficient documentation

## 2018-04-24 DIAGNOSIS — Z9889 Other specified postprocedural states: Secondary | ICD-10-CM | POA: Insufficient documentation

## 2018-04-24 DIAGNOSIS — Z79899 Other long term (current) drug therapy: Secondary | ICD-10-CM | POA: Insufficient documentation

## 2018-04-24 HISTORY — PX: PORTA CATH INSERTION: CATH118285

## 2018-04-24 SURGERY — PORTA CATH INSERTION
Anesthesia: Moderate Sedation

## 2018-04-24 MED ORDER — LIDOCAINE-EPINEPHRINE (PF) 1 %-1:200000 IJ SOLN
INTRAMUSCULAR | Status: AC
  Filled 2018-04-24: qty 30

## 2018-04-24 MED ORDER — FENTANYL CITRATE (PF) 100 MCG/2ML IJ SOLN
INTRAMUSCULAR | Status: AC
  Filled 2018-04-24: qty 2

## 2018-04-24 MED ORDER — FENTANYL CITRATE (PF) 100 MCG/2ML IJ SOLN
INTRAMUSCULAR | Status: DC | PRN
  Administered 2018-04-24 (×2): 50 ug via INTRAVENOUS
  Administered 2018-04-24 (×2): 25 ug via INTRAVENOUS

## 2018-04-24 MED ORDER — MIDAZOLAM HCL 5 MG/5ML IJ SOLN
INTRAMUSCULAR | Status: AC
  Filled 2018-04-24: qty 5

## 2018-04-24 MED ORDER — SODIUM CHLORIDE 0.9 % IV SOLN
Freq: Once | INTRAVENOUS | Status: DC
  Filled 2018-04-24 (×2): qty 2

## 2018-04-24 MED ORDER — SODIUM CHLORIDE 0.9 % IV SOLN
INTRAVENOUS | Status: DC
  Administered 2018-04-24: 1000 mL via INTRAVENOUS

## 2018-04-24 MED ORDER — HEPARIN (PORCINE) IN NACL 1000-0.9 UT/500ML-% IV SOLN
INTRAVENOUS | Status: AC
  Filled 2018-04-24: qty 500

## 2018-04-24 MED ORDER — DIPHENHYDRAMINE HCL 50 MG/ML IJ SOLN
INTRAMUSCULAR | Status: DC | PRN
  Administered 2018-04-24: 25 mg via INTRAVENOUS

## 2018-04-24 MED ORDER — CEFAZOLIN SODIUM-DEXTROSE 2-4 GM/100ML-% IV SOLN
INTRAVENOUS | Status: AC
  Filled 2018-04-24: qty 100

## 2018-04-24 MED ORDER — MIDAZOLAM HCL 2 MG/2ML IJ SOLN
INTRAMUSCULAR | Status: DC | PRN
  Administered 2018-04-24: 1 mg via INTRAVENOUS
  Administered 2018-04-24 (×3): 2 mg via INTRAVENOUS

## 2018-04-24 MED ORDER — DIPHENHYDRAMINE HCL 50 MG/ML IJ SOLN
INTRAMUSCULAR | Status: AC
  Filled 2018-04-24: qty 1

## 2018-04-24 SURGICAL SUPPLY — 7 items
KIT PORT POWER 8FR ISP CVUE (Port) ×2 IMPLANT
PACK ANGIOGRAPHY (CUSTOM PROCEDURE TRAY) ×2 IMPLANT
PAD GROUND ADULT SPLIT (MISCELLANEOUS) ×2 IMPLANT
PENCIL ELECTRO HAND CTR (MISCELLANEOUS) ×2 IMPLANT
SUT MNCRL AB 4-0 PS2 18 (SUTURE) ×2 IMPLANT
SUT PROLENE 0 CT 1 30 (SUTURE) ×2 IMPLANT
SUT VICRYL+ 3-0 36IN CT-1 (SUTURE) ×2 IMPLANT

## 2018-04-24 NOTE — H&P (Signed)
[facility] History & Physical Update  The patient was interviewed and re-examined.  The patient's previous History and Physical has been reviewed and is unchanged.  There is no change in the plan of care. We plan to proceed with the scheduled procedure.  Leotis Pain, MD  04/24/2018, 8:10 AM

## 2018-04-24 NOTE — Progress Notes (Signed)
[name]  [phone] [phone]  ID: Paul Carpenter OB: June 03, 1956  MR#: [id number]  FMB#:[id number]  Patient Care Team: Kirk Ruths, MD as PCP - General (Internal Medicine)  CHIEF COMPLAINT: Stage IIIa non-small cell lung cancer, right middle lobe.  INTERVAL HISTORY: Patient returns to clinic today for further evaluation, discussion of his imaging and pathology results, and treatment planning.  He continues to be anxious, but otherwise feels well. He has no neurologic complaints. He denies any recent fevers or illnesses. He has no chest pain, cough, hemoptysis, or shortness of breath. He denies any nausea, vomiting, constipation, or diarrhea. He has no urinary complaints.  Patient feels at his baseline and offers no further specific complaints today.  REVIEW OF SYSTEMS:   Review of Systems  Constitutional: Negative.  Negative for fever, malaise/fatigue and weight loss.  Respiratory: Negative.  Negative for cough, hemoptysis and shortness of breath.   Cardiovascular: Negative.  Negative for chest pain.  Gastrointestinal: Negative.  Negative for abdominal pain, blood in stool and melena.  Genitourinary: Negative.  Negative for dysuria.  Musculoskeletal: Negative.  Negative for joint pain.  Skin: Negative.  Negative for rash.  Neurological: Negative.  Negative for sensory change, focal weakness and weakness.  Psychiatric/Behavioral: The patient is nervous/anxious.     As per HPI. Otherwise, a complete review of systems is negative.  PAST MEDICAL HISTORY: Past Medical History:  Diagnosis Date  . Anginal pain ([place])   . Anxiety   . Chest pain   . Chicken pox   . Complication of anesthesia    o2 dropped after neck fusion  . COPD (chronic obstructive pulmonary disease) ([place])   . Coronary artery disease   . Cough    chronic  clear phlegm  . Dysrhythmia    palpitations  . GERD (gastroesophageal reflux disease)    h/o reflux/  hoarsness  . Hematochezia   . Hemorrhoids   . History of chickenpox   . History of colon polyps   . History of Helicobacter pylori infection   . Hoarseness   . Hypertension   . Migraines   . OSA (obstructive sleep apnea)    has CPAP but does not use  . Personal history of tobacco use, presenting hazards to health 03/05/2016  . Pneumonia    5/17  . Raynaud disease   . Raynaud disease   . Raynaud's disease   . Rotator cuff tear    on right  . Shortness of breath dyspnea   . Sleep apnea   . Ulcer (traumatic) of oral mucosa     PAST SURGICAL HISTORY: Past Surgical History:  Procedure Laterality Date  . BACK SURGERY     cervical fusion x 2  . CARDIAC CATHETERIZATION    . CERVICAL DISCECTOMY     x 2  . COLONOSCOPY    . COLONOSCOPY N/A 07/25/2015   Procedure: COLONOSCOPY;  Surgeon: Lollie Sails, MD;  Location: [facility] ENDOSCOPY;  Service: Endoscopy;  Laterality: N/A;  . COLONOSCOPY WITH PROPOFOL N/A 10/04/2017   Procedure: COLONOSCOPY WITH PROPOFOL;  Surgeon: Lollie Sails, MD;  Location: [facility] ENDOSCOPY;  Service: Endoscopy;  Laterality: N/A;  . ELECTROMAGNETIC NAVIGATION BROCHOSCOPY Left 06/28/2016   Procedure: ELECTROMAGNETIC NAVIGATION BRONCHOSCOPY;  Surgeon: Vilinda Boehringer, MD;  Location: [facility] ORS;  Service: Cardiopulmonary;  Laterality: Left;  . ENDOBRONCHIAL ULTRASOUND N/A 04/11/2018   Procedure: ENDOBRONCHIAL ULTRASOUND;  Surgeon: [name], MD;  Location: [facility] ORS;  Service: Cardiopulmonary;  Laterality: N/A;  .  ESOPHAGOGASTRODUODENOSCOPY N/A 07/25/2015   Procedure: ESOPHAGOGASTRODUODENOSCOPY (EGD);  Surgeon: Lollie Sails, MD;  Location: [facility] ENDOSCOPY;  Service: Endoscopy;  Laterality: N/A;  . NASAL SINUS SURGERY     x 2   . PORTA CATH INSERTION N/A 04/24/2018   Procedure: PORTA CATH INSERTION;  Surgeon: Algernon Huxley, MD;  Location: [place] CV LAB;  Service: Cardiovascular;  Laterality: N/A;  . rotator cuff surgery Right    07/2016  . SEPTOPLASTY    .  SKIN GRAFT      FAMILY HISTORY Family History  Problem Relation Age of Onset  . Heart disease Father   . Prostate cancer Father   . Heart disease Paternal Grandmother   . Heart attack Maternal Grandfather 52  . Kidney cancer Neg Hx   . Bladder Cancer Neg Hx   . Other Neg Hx        pituitary abnormality       ADVANCED DIRECTIVES:    HEALTH MAINTENANCE: Social History   Tobacco Use  . Smoking status: Current Every Day Smoker    Packs/day: 1.50    Years: 45.00    Pack years: 67.50    Types: Cigarettes  . Smokeless tobacco: Never Used  Substance Use Topics  . Alcohol use: Yes    Alcohol/week: 1.2 oz    Types: 2 Standard drinks or equivalent per week    Comment: moderate  . Drug use: No     Allergies  Allergen Reactions  . Lisinopril Rash  . Varenicline Rash    Current Outpatient Medications  Medication Sig Dispense Refill  . acetaminophen (TYLENOL) 500 MG tablet Take 1,000 mg by mouth every 6 (six) hours as needed (for pain.).    Marland Kitchen albuterol (PROAIR HFA) 108 (90 Base) MCG/ACT inhaler Inhale 2 puffs into the lungs every 6 (six) hours as needed for wheezing or shortness of breath. 1 Inhaler 3  . atorvastatin (LIPITOR) 40 MG tablet Take 40 mg by mouth at bedtime.     Marland Kitchen BREO ELLIPTA 200-25 MCG/INH AEPB TAKE 1 PUFF BY MOUTH EVERY DAY 60 each 6  . diazepam (VALIUM) 5 MG tablet Take 5 mg by mouth every 12 (twelve) hours as needed for anxiety (SCHEDULED EVERY NIGHT).     . DULoxetine (CYMBALTA) 60 MG capsule Take 60 mg by mouth daily.     . fluticasone (FLONASE) 50 MCG/ACT nasal spray Place 1 spray into both nostrils daily as needed (for allergies.).     Marland Kitchen ipratropium-albuterol (DUONEB) 0.5-2.5 (3) MG/3ML SOLN Take 3 mLs by nebulization every 6 (six) hours. (Patient taking differently: Take 3 mLs by nebulization every 6 (six) hours as needed (for wheezing/shortness of breath.). ) 360 mL 5  . metoprolol tartrate (LOPRESSOR) 50 MG tablet Take 50 mg by mouth 2 (two) times  daily.    . mirabegron ER (MYRBETRIQ) 50 MG TB24 tablet Take 1 tablet (50 mg total) by mouth daily. 90 tablet 3  . Multiple Vitamin (MULTIVITAMIN WITH MINERALS) TABS tablet Take 1 tablet by mouth at bedtime.    . nicotine (NICODERM CQ - DOSED IN MG/24 HOURS) 21 mg/24hr patch Place 1 patch (21 mg total) onto the skin daily. 30 patch 12  . nitroGLYCERIN (NITROSTAT) 0.4 MG SL tablet Place 0.4 mg under the tongue every 5 (five) minutes as needed for chest pain.     . OXYGEN Inhale 2 L into the lungs at bedtime.     . pantoprazole (PROTONIX) 40 MG tablet Take 40 mg by mouth  daily.  3  . potassium chloride (K-DUR,KLOR-CON) 10 MEQ tablet Take 10 mEq by mouth daily.    Marland Kitchen SPIRIVA RESPIMAT 2.5 MCG/ACT AERS INHALE 2 PUFFS INTO THE LUNGS DAILY. 4 g 6   No current facility-administered medications for this visit.     OBJECTIVE: Vitals:   04/25/18 1127  BP: (!) 160/77  Pulse: 82  Resp: 18  Temp: 97.9 F (36.6 C)     Body mass index is 26.86 kg/m.    ECOG FS:0 - Asymptomatic  General: Well-developed, well-nourished, no acute distress. Eyes: Pink conjunctiva, anicteric sclera. HEENT: Normocephalic, moist mucous membranes, clear oropharnyx. Lungs: Clear to auscultation bilaterally. Heart: Regular rate and rhythm. No rubs, murmurs, or gallops. Abdomen: Soft, nontender, nondistended. No organomegaly noted, normoactive bowel sounds. Musculoskeletal: No edema, cyanosis, or clubbing. Neuro: Alert, answering all questions appropriately. Cranial nerves grossly intact. Skin: No rashes or petechiae noted. Psych: Normal affect. Lymphatics: No cervical, calvicular, axillary or inguinal LAD.   LAB RESULTS:  Lab Results  Component Value Date   NA 132 (L) 02/16/2018   K 3.9 04/06/2018   CL 97 02/16/2018   CO2 29 02/16/2018   GLUCOSE 77 02/16/2018   BUN 7 02/16/2018   CREATININE 0.77 02/16/2018   CALCIUM 9.2 02/16/2018   PROT 6.9 06/01/2016   ALBUMIN 4.2 06/01/2016   AST 24 06/01/2016   ALT 26  06/01/2016   ALKPHOS 74 06/01/2016   BILITOT 0.8 06/01/2016   GFRNONAA >60 02/04/2017   GFRAA >60 02/04/2017    Lab Results  Component Value Date   WBC 10.7 (H) 04/06/2018   NEUTROABS 7.0 (H) 09/28/2017   HGB 15.8 04/06/2018   HCT 45.4 04/06/2018   MCV 99.9 04/06/2018   PLT 251 04/06/2018     STUDIES:  ASSESSMENT: Stage IIIa non-small cell lung cancer, right middle lobe.  PLAN:    1. Stage IIIa non-small cell lung cancer, right middle lobe: Case discussed with pathologist and unable to determine whether this is adenocarcinoma or squamous carcinoma.  There is also insufficient tissue to do further testing.  Because of this, I have ordered "liquid biopsy" to assess if there are any actionable mutations.  PET scan and MRI brain results reviewed independently and report as above confirming stage of disease.  Patient will benefit from concurrent chemotherapy and XRT with weekly carboplatin and Taxol.  At the conclusion of his XRT, we will proceed with maintenance Imfinzi every 2 weeks for up to 12 months.  Patient has had consultation with radiation oncology and will initiate treatment on May 10, 2018.  Return to clinic on May 03, 2018 to initiate cycle 1 of weekly carboplatin and Taxol. 2.  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 Patient was also taking testosterone, but this has now been discontinued.  Previously, the remainder of his laboratory work, including JAK-2 mutation, was either negative or within normal limits.  3. Left upper lobe pulmonary nodule: Imaging results as above.  Previously, biopsy completed on October 04, 2017 was negative for malignancy.  4.  Colon polyps:  Patient has a personal history of greater then 10 adenomatous polyps on his most recent conoloscopy. He does not know of any family history of increased polyps or colon cancer.  Genetic testing to assess for the APC mutation for FAP or AFAP was negative. Continue colonoscopies as per GI. 5.  Tobacco Use: Patient continues to heavily smoke, but has decreased significantly. 6. Anxiety: Chronic and unchanged.  I spent a total of 30  minutes face-to-face with the patient of which greater than 50% of the visit was spent in counseling and coordination of care as summarized above.    Patient expressed understanding and was in agreement with this plan. He also understands that He can call clinic at any time with any questions, concerns, or complaints.    [name], MD   04/28/2018 11:40 AM

## 2018-04-24 NOTE — Op Note (Signed)
      [facility]       Operative Note  Date: 04/24/2018  Preoperative diagnosis:  1. Lung cancer  Postoperative diagnosis:  Same as above  Procedures: #1. Ultrasound guidance for vascular access to the right internal jugular vein. #2. Fluoroscopic guidance for placement of catheter. #3. Placement of CT compatible Port-A-Cath, right internal jugular vein.  Surgeon: Leotis Pain, MD.   Anesthesia: Local with moderate conscious sedation for approximately 25  minutes using 7 mg of Versed and 150 mcg of Fentanyl  Fluoroscopy time: less than 1 minute  Contrast used: 0  Estimated blood loss: 5 cc  Indication for the procedure:  The patient is a 61 y.o.male with lung cancer.  The patient needs a Port-A-Cath for durable venous access, chemotherapy, lab draws, and CT scans. We are asked to place this. Risks and benefits were discussed and informed consent was obtained.  Description of procedure: The patient was brought to the vascular and interventional radiology suite.  Moderate conscious sedation was administered throughout the procedure during a face to face encounter with the patient with my supervision of the RN administering medicines and monitoring the patient's vital signs, pulse oximetry, telemetry and mental status throughout from the start of the procedure until the patient was taken to the recovery room. The right neck chest and shoulder were sterilely prepped and draped, and a sterile surgical field was created. Ultrasound was used to help visualize a patent right internal jugular vein. This was then accessed under direct ultrasound guidance without difficulty with the Seldinger needle and a permanent image was recorded. A J-wire was placed. After skin nick and dilatation, the peel-away sheath was then placed over the wire. I then anesthetized an area under the clavicle approximately 1-2 fingerbreadths. A transverse incision was created and an inferior pocket was  created with electrocautery and blunt dissection. The port was then brought onto the field, placed into the pocket and secured to the chest wall with 2 Prolene sutures. The catheter was connected to the port and tunneled from the subclavicular incision to the access site. Fluoroscopic guidance was then used to cut the catheter to an appropriate length. The catheter was then placed through the peel-away sheath and the peel-away sheath was removed. The catheter tip was parked in excellent location under fluorocoscopic guidance in the mid SVC. The pocket was then irrigated with antibiotic impregnated saline and the wound was closed with a running 3-0 Vicryl and a 4-0 Monocryl. The access incision was closed with a single 4-0 Monocryl. The Huber needle was used to withdraw blood and flush the port with heparinized saline. Dermabond was then placed as a dressing. The patient tolerated the procedure well and was taken to the recovery room in stable condition.   Leotis Pain 04/24/2018 9:08 AM   This note was created with Dragon Medical transcription system. Any errors in dictation are purely unintentional.

## 2018-04-25 ENCOUNTER — Inpatient Hospital Stay (HOSPITAL_BASED_OUTPATIENT_CLINIC_OR_DEPARTMENT_OTHER): Payer: BLUE CROSS/BLUE SHIELD | Admitting: Oncology

## 2018-04-25 ENCOUNTER — Ambulatory Visit
Admission: RE | Admit: 2018-04-25 | Discharge: 2018-04-25 | Disposition: A | Discharge status: Home / Self Care | Payer: BLUE CROSS/BLUE SHIELD | Source: Ambulatory Visit | Attending: Radiation Oncology | Admitting: Radiation Oncology

## 2018-04-25 ENCOUNTER — Encounter: Payer: Self-pay | Admitting: *Deleted

## 2018-04-25 ENCOUNTER — Inpatient Hospital Stay: Payer: BLUE CROSS/BLUE SHIELD | Attending: Oncology | Admitting: Oncology

## 2018-04-25 VITALS — BP 160/77 | HR 82 | Temp 97.9°F | Resp 18 | Wt 198.1 lb

## 2018-04-25 DIAGNOSIS — C3491 Malignant neoplasm of unspecified part of right bronchus or lung: Secondary | ICD-10-CM

## 2018-04-25 DIAGNOSIS — K219 Gastro-esophageal reflux disease without esophagitis: Secondary | ICD-10-CM | POA: Insufficient documentation

## 2018-04-25 DIAGNOSIS — I2510 Atherosclerotic heart disease of native coronary artery without angina pectoris: Secondary | ICD-10-CM | POA: Insufficient documentation

## 2018-04-25 DIAGNOSIS — Z9981 Dependence on supplemental oxygen: Secondary | ICD-10-CM | POA: Diagnosis not present

## 2018-04-25 DIAGNOSIS — C342 Malignant neoplasm of middle lobe, bronchus or lung: Secondary | ICD-10-CM

## 2018-04-25 DIAGNOSIS — G4733 Obstructive sleep apnea (adult) (pediatric): Secondary | ICD-10-CM | POA: Insufficient documentation

## 2018-04-25 DIAGNOSIS — D751 Secondary polycythemia: Secondary | ICD-10-CM | POA: Diagnosis not present

## 2018-04-25 DIAGNOSIS — J449 Chronic obstructive pulmonary disease, unspecified: Secondary | ICD-10-CM | POA: Insufficient documentation

## 2018-04-25 DIAGNOSIS — I7300 Raynaud's syndrome without gangrene: Secondary | ICD-10-CM | POA: Diagnosis not present

## 2018-04-25 DIAGNOSIS — F17210 Nicotine dependence, cigarettes, uncomplicated: Secondary | ICD-10-CM | POA: Diagnosis not present

## 2018-04-25 DIAGNOSIS — Z8619 Personal history of other infectious and parasitic diseases: Secondary | ICD-10-CM | POA: Diagnosis not present

## 2018-04-25 DIAGNOSIS — I10 Essential (primary) hypertension: Secondary | ICD-10-CM | POA: Diagnosis not present

## 2018-04-25 DIAGNOSIS — F419 Anxiety disorder, unspecified: Secondary | ICD-10-CM

## 2018-04-25 DIAGNOSIS — Z79899 Other long term (current) drug therapy: Secondary | ICD-10-CM | POA: Insufficient documentation

## 2018-04-25 DIAGNOSIS — Z86010 Personal history of colonic polyps: Secondary | ICD-10-CM | POA: Diagnosis not present

## 2018-04-25 NOTE — Patient Instructions (Signed)

## 2018-04-25 NOTE — Consult Note (Signed)
NEW PATIENT EVALUATION  Name: Paul Carpenter  MRN: [id number]  Date:   04/25/2018     DOB: 1956/01/13   This 62 y.o. male patient presents to the clinic for initial evaluation of stage IIIB non-small cell lung cancer of the right lung (T1 N3 M0.  REFERRING PHYSICIAN: Kirk Ruths, MD  CHIEF COMPLAINT: No chief complaint on file.   DIAGNOSIS: There were no encounter diagnoses.   PREVIOUS INVESTIGATIONS:  PET/CT and CT scans reviewed Cytology report reviewed Clinical notes reviewed  HPI: patient is a 62 year old male heavy smoker over 60 pack year history with significant COPD on nocturnal oxygen for chronic hypoxic respiratory failure who is been followed with CT screenings. He's had waxing and waning nodules in both lungs. Recently had nodules in right middle lobe increasing in size. He underwent a PET CT scan showing intensely hypermetabolic subcarinal lymph nodes consistent with metastatic adenopathy. The primary lesion was also large right middle lobe lesion with mild metabolic activity. He does have some new nodules in the right lung apex with faint metabolic activity which may be a secondary primary or metastatic lesion. He has been having some dysphasia most likely from extrinsic compression on the esophagus. He underwent endoscopic bronchoscopy which cytology positive for non-small cell lung cancer. He's been seem a medical oncology's had a port placed in his right anterior chest is now seen for radiation oncology opinion. He is weight is stable. He is having no bone pain. He specifically denies cough hemoptysis or chest tightness.  PLANNED TREATMENT REGIMEN: concurrent chemoradiation  PAST MEDICAL HISTORY:  has a past medical history of Anginal pain ([place]), Anxiety, Chest pain, Chicken pox, Complication of anesthesia, COPD (chronic obstructive pulmonary disease) ([place]), Coronary artery disease, Cough, Dysrhythmia, GERD (gastroesophageal reflux disease), Hematochezia,  Hemorrhoids, History of chickenpox, History of colon polyps, History of Helicobacter pylori infection, Hoarseness, Hypertension, Migraines, OSA (obstructive sleep apnea), Personal history of tobacco use, presenting hazards to health (03/05/2016), Pneumonia, Raynaud disease, Raynaud disease, Raynaud's disease, Rotator cuff tear, Shortness of breath dyspnea, Sleep apnea, and Ulcer (traumatic) of oral mucosa.    PAST SURGICAL HISTORY:  Past Surgical History:  Procedure Laterality Date  . BACK SURGERY     cervical fusion x 2  . CARDIAC CATHETERIZATION    . CERVICAL DISCECTOMY     x 2  . COLONOSCOPY    . COLONOSCOPY N/A 07/25/2015   Procedure: COLONOSCOPY;  Surgeon: Lollie Sails, MD;  Location: [facility] ENDOSCOPY;  Service: Endoscopy;  Laterality: N/A;  . COLONOSCOPY WITH PROPOFOL N/A 10/04/2017   Procedure: COLONOSCOPY WITH PROPOFOL;  Surgeon: Lollie Sails, MD;  Location: [facility] ENDOSCOPY;  Service: Endoscopy;  Laterality: N/A;  . ELECTROMAGNETIC NAVIGATION BROCHOSCOPY Left 06/28/2016   Procedure: ELECTROMAGNETIC NAVIGATION BRONCHOSCOPY;  Surgeon: Vilinda Boehringer, MD;  Location: [facility] ORS;  Service: Cardiopulmonary;  Laterality: Left;  . ENDOBRONCHIAL ULTRASOUND N/A 04/11/2018   Procedure: ENDOBRONCHIAL ULTRASOUND;  Surgeon: [name], MD;  Location: [facility] ORS;  Service: Cardiopulmonary;  Laterality: N/A;  . ESOPHAGOGASTRODUODENOSCOPY N/A 07/25/2015   Procedure: ESOPHAGOGASTRODUODENOSCOPY (EGD);  Surgeon: Lollie Sails, MD;  Location: [facility] ENDOSCOPY;  Service: Endoscopy;  Laterality: N/A;  . NASAL SINUS SURGERY     x 2   . PORTA CATH INSERTION N/A 04/24/2018   Procedure: PORTA CATH INSERTION;  Surgeon: Algernon Huxley, MD;  Location: [place] CV LAB;  Service: Cardiovascular;  Laterality: N/A;  . rotator cuff surgery Right    07/2016  . SEPTOPLASTY    . SKIN  GRAFT      FAMILY HISTORY: family history includes Heart attack (age of onset: 60) in his maternal grandfather; Heart disease in  his father and paternal grandmother; Prostate cancer in his father.  SOCIAL HISTORY:  reports that he has been smoking cigarettes.  He has a 67.50 pack-year smoking history. He has never used smokeless tobacco. He reports that he drinks about 1.2 oz of alcohol per week. He reports that he does not use drugs.  ALLERGIES: Lisinopril and Varenicline  MEDICATIONS:  Current Outpatient Medications  Medication Sig Dispense Refill  . acetaminophen (TYLENOL) 500 MG tablet Take 1,000 mg by mouth every 6 (six) hours as needed (for pain.).    Marland Kitchen albuterol (PROAIR HFA) 108 (90 Base) MCG/ACT inhaler Inhale 2 puffs into the lungs every 6 (six) hours as needed for wheezing or shortness of breath. 1 Inhaler 3  . atorvastatin (LIPITOR) 40 MG tablet Take 40 mg by mouth at bedtime.     Marland Kitchen BREO ELLIPTA 200-25 MCG/INH AEPB TAKE 1 PUFF BY MOUTH EVERY DAY 60 each 6  . diazepam (VALIUM) 5 MG tablet Take 5 mg by mouth every 12 (twelve) hours as needed for anxiety (SCHEDULED EVERY NIGHT).     . DULoxetine (CYMBALTA) 60 MG capsule Take 60 mg by mouth daily.     . fluticasone (FLONASE) 50 MCG/ACT nasal spray Place 1 spray into both nostrils daily as needed (for allergies.).     Marland Kitchen ipratropium-albuterol (DUONEB) 0.5-2.5 (3) MG/3ML SOLN Take 3 mLs by nebulization every 6 (six) hours. (Patient taking differently: Take 3 mLs by nebulization every 6 (six) hours as needed (for wheezing/shortness of breath.). ) 360 mL 5  . metoprolol tartrate (LOPRESSOR) 50 MG tablet Take 50 mg by mouth 2 (two) times daily.    . mirabegron ER (MYRBETRIQ) 50 MG TB24 tablet Take 1 tablet (50 mg total) by mouth daily. 90 tablet 3  . Multiple Vitamin (MULTIVITAMIN WITH MINERALS) TABS tablet Take 1 tablet by mouth at bedtime.    . nicotine (NICODERM CQ - DOSED IN MG/24 HOURS) 21 mg/24hr patch Place 1 patch (21 mg total) onto the skin daily. 30 patch 12  . nitroGLYCERIN (NITROSTAT) 0.4 MG SL tablet Place 0.4 mg under the tongue every 5 (five) minutes as  needed for chest pain.     . OXYGEN Inhale 2 L into the lungs at bedtime.     . pantoprazole (PROTONIX) 40 MG tablet Take 40 mg by mouth daily.  3  . potassium chloride (K-DUR,KLOR-CON) 10 MEQ tablet Take 10 mEq by mouth daily.    Marland Kitchen SPIRIVA RESPIMAT 2.5 MCG/ACT AERS INHALE 2 PUFFS INTO THE LUNGS DAILY. 4 g 6   No current facility-administered medications for this encounter.     ECOG PERFORMANCE STATUS:  0 - Asymptomatic  REVIEW OF SYSTEMS: except for the pulmonary symptoms he is experiencing secondary to heavy smoking abuse Patient denies any weight loss, fatigue, weakness, fever, chills or night sweats. Patient denies any loss of vision, blurred vision. Patient denies any ringing  of the ears or hearing loss. No irregular heartbeat. Patient denies heart murmur or history of fainting. Patient denies any chest pain or pain radiating to her upper extremities. Patient denies any shortness of breath, difficulty breathing at night, cough or hemoptysis. Patient denies any swelling in the lower legs. Patient denies any nausea vomiting, vomiting of blood, or coffee ground material in the vomitus. Patient denies any stomach pain. Patient states has had normal bowel movements no significant  constipation or diarrhea. Patient denies any dysuria, hematuria or significant nocturia. Patient denies any problems walking, swelling in the joints or loss of balance. Patient denies any skin changes, loss of hair or loss of weight. Patient denies any excessive worrying or anxiety or significant depression. Patient denies any problems with insomnia. Patient denies excessive thirst, polyuria, polydipsia. Patient denies any swollen glands, patient denies easy bruising or easy bleeding. Patient denies any recent infections, allergies or URI. Patient "s visual fields have not changed significantly in recent time.   PHYSICAL EXAM: There were no vitals taken for this visit. Well-developed well-nourished patient in NAD. HEENT  reveals PERLA, EOMI, discs not visualized.  Oral cavity is clear. No oral mucosal lesions are identified. Neck is clear without evidence of cervical or supraclavicular adenopathy. Lungs are clear to A&P. Cardiac examination is essentially unremarkable with regular rate and rhythm without murmur rub or thrill. Abdomen is benign with no organomegaly or masses noted. Motor sensory and DTR levels are equal and symmetric in the upper and lower extremities. Cranial nerves II through XII are grossly intact. Proprioception is intact. No peripheral adenopathy or edema is identified. No motor or sensory levels are noted. Crude visual fields are within normal range.  LABORATORY DATA: cytology reports reviewedMRI of brain reviewed. Pre-and MRI shows no evidence of metastatic disease    RADIOLOGY RESULTS:PET CT scan CT scans and   IMPRESSION: stage IIIBnon-small cell lung cancer based on subcarinal hypermetabolic malignant adenopathy in 62 year old male  PLAN: at this time are compared with concurrent chemoradiation. Would plan on delivering 6600 cGy over 6 and half weeks to his primary tumor and subcarinal lymph node. Based on the close proximity to his esophagus heart spinal cord would favor IM RT treatment planning and delivery to avoid critical structures as mentioned above. Risks and benefits of treatment including possible increased and dysphasia secondary radiation esophagitis fatigue alteration of blood counts loss of normal lung volume skin reaction all were discussed in detail with the patient and his wife. I have personally set up and ordered CT simulation for later this week.There will be extra effort by both professional staff as well as technical staff to coordinate and manage concurrent chemoradiation and ensuing side effects during his treatments.  Patient wife both seem to comprehend our treatment plan well. Will use PET/CT fusion study during treatments and monitor the 4D motion during treatment  planning.  I would like to take this opportunity to thank you for allowing me to participate in the care of your patient.[name], MD

## 2018-04-25 NOTE — Progress Notes (Signed)
  Oncology Nurse Navigator Documentation  Navigator Location: [facility] (04/25/18 1500) Referral date to RadOnc/MedOnc: 03/13/18 (04/25/18 1500) )Navigator Encounter Type: Initial MedOnc;Initial RadOnc (04/25/18 1500)   Abnormal Finding Date: 03/10/18 (04/25/18 1500) Confirmed Diagnosis Date: 04/18/18 (04/25/18 1500)                 Treatment Phase: Pre-Tx/Tx Discussion (04/25/18 1500) Barriers/Navigation Needs: Education;Coordination of Care (04/25/18 1500) Education: Understanding Cancer/ Treatment Options;Newly Diagnosed Cancer Education (04/25/18 1500) Interventions: Coordination of Care;Education (04/25/18 1500)   Coordination of Care: Appts;Chemo (04/25/18 1500) Education Method: Verbal;Written (04/25/18 1500)      Acuity: Level 2 (04/25/18 1500)   Acuity Level 2: Initial guidance, education and coordination as needed;Educational needs;Assistance expediting appointments (04/25/18 1500)    met with patient and wife during initial med-onc and rad-onc consultation to discuss pathology results and discuss treatment planning. All questions answered at the time of visit. Reviewed upcoming appts with patient and wife. Resources given regarding diagnosis and supportive services available. Specimen collected for liquid biopsy with [facility] and sent out via fedex. Pt informed to expect results from test in approximately 2 weeks but at this time will continue with current treatment plan per Dr. Grayland Ormond and Dr. Baruch Gouty. Contact info given and instructed to call with any further questions or concerns. Pt and wife verbalized understanding.  Time Spent with Patient: 120 (04/25/18 1500)

## 2018-04-26 ENCOUNTER — Inpatient Hospital Stay: Payer: BLUE CROSS/BLUE SHIELD

## 2018-04-27 ENCOUNTER — Ambulatory Visit
Admission: RE | Admit: 2018-04-27 | Discharge: 2018-04-27 | Disposition: A | Discharge status: Home / Self Care | Payer: BLUE CROSS/BLUE SHIELD | Source: Ambulatory Visit | Attending: Radiation Oncology | Admitting: Radiation Oncology

## 2018-04-27 ENCOUNTER — Telehealth: Payer: Self-pay | Admitting: *Deleted

## 2018-04-27 DIAGNOSIS — C342 Malignant neoplasm of middle lobe, bronchus or lung: Secondary | ICD-10-CM | POA: Diagnosis not present

## 2018-04-27 DIAGNOSIS — Z510 Encounter for antineoplastic radiation therapy: Secondary | ICD-10-CM | POA: Insufficient documentation

## 2018-04-27 DIAGNOSIS — F17210 Nicotine dependence, cigarettes, uncomplicated: Secondary | ICD-10-CM | POA: Diagnosis not present

## 2018-04-27 DIAGNOSIS — F418 Other specified anxiety disorders: Secondary | ICD-10-CM | POA: Insufficient documentation

## 2018-04-27 NOTE — Telephone Encounter (Signed)
Pt's wife called in to request that anti-emetics and EMLA cream be called into pharmacy today so pt can have ready prior to treatment starting next week.

## 2018-04-27 NOTE — Telephone Encounter (Signed)
They will likely get called in this weekend when I put in his chemo plan.

## 2018-04-27 NOTE — H&P (Signed)
[facility] Admission History & Physical  MRN : [id number]  Paul Carpenter is a 62 y.o. (11-08-1955) male who presents with chief complaint of No chief complaint on file. Marland Kitchen  History of Present Illness: Patient presents today for port a cath placement.  He has recently diagnosed lung cancer and will be starting chemotherapy shortly.  No fevers, chills, or signs of infection.  No new complaints today.  No current facility-administered medications for this encounter.    Current Outpatient Medications  Medication Sig Dispense Refill  . acetaminophen (TYLENOL) 500 MG tablet Take 1,000 mg by mouth every 6 (six) hours as needed (for pain.).    Marland Kitchen atorvastatin (LIPITOR) 40 MG tablet Take 40 mg by mouth at bedtime.     Marland Kitchen BREO ELLIPTA 200-25 MCG/INH AEPB TAKE 1 PUFF BY MOUTH EVERY DAY 60 each 6  . diazepam (VALIUM) 5 MG tablet Take 5 mg by mouth every 12 (twelve) hours as needed for anxiety (SCHEDULED EVERY NIGHT).     . DULoxetine (CYMBALTA) 60 MG capsule Take 60 mg by mouth daily.     . metoprolol tartrate (LOPRESSOR) 50 MG tablet Take 50 mg by mouth 2 (two) times daily.    . mirabegron ER (MYRBETRIQ) 50 MG TB24 tablet Take 1 tablet (50 mg total) by mouth daily. 90 tablet 3  . Multiple Vitamin (MULTIVITAMIN WITH MINERALS) TABS tablet Take 1 tablet by mouth at bedtime.    . nicotine (NICODERM CQ - DOSED IN MG/24 HOURS) 21 mg/24hr patch Place 1 patch (21 mg total) onto the skin daily. 30 patch 12  . OXYGEN Inhale 2 L into the lungs at bedtime.     . pantoprazole (PROTONIX) 40 MG tablet Take 40 mg by mouth daily.  3  . potassium chloride (K-DUR,KLOR-CON) 10 MEQ tablet Take 10 mEq by mouth daily.    Marland Kitchen SPIRIVA RESPIMAT 2.5 MCG/ACT AERS INHALE 2 PUFFS INTO THE LUNGS DAILY. 4 g 6  . albuterol (PROAIR HFA) 108 (90 Base) MCG/ACT inhaler Inhale 2 puffs into the lungs every 6 (six) hours as needed for wheezing or shortness of breath. 1 Inhaler 3  . fluticasone (FLONASE) 50 MCG/ACT  nasal spray Place 1 spray into both nostrils daily as needed (for allergies.).     Marland Kitchen ipratropium-albuterol (DUONEB) 0.5-2.5 (3) MG/3ML SOLN Take 3 mLs by nebulization every 6 (six) hours. (Patient taking differently: Take 3 mLs by nebulization every 6 (six) hours as needed (for wheezing/shortness of breath.). ) 360 mL 5  . nitroGLYCERIN (NITROSTAT) 0.4 MG SL tablet Place 0.4 mg under the tongue every 5 (five) minutes as needed for chest pain.       Past Medical History:  Diagnosis Date  . Anginal pain ([place])   . Anxiety   . Chest pain   . Chicken pox   . Complication of anesthesia    o2 dropped after neck fusion  . COPD (chronic obstructive pulmonary disease) ([place])   . Coronary artery disease   . Cough    chronic  clear phlegm  . Dysrhythmia    palpitations  . GERD (gastroesophageal reflux disease)    h/o reflux/ hoarsness  . Hematochezia   . Hemorrhoids   . History of chickenpox   . History of colon polyps   . History of Helicobacter pylori infection   . Hoarseness   . Hypertension   . Migraines   . OSA (obstructive sleep apnea)    has CPAP but does not use  .  Personal history of tobacco use, presenting hazards to health 03/05/2016  . Pneumonia    5/17  . Raynaud disease   . Raynaud disease   . Raynaud's disease   . Rotator cuff tear    on right  . Shortness of breath dyspnea   . Sleep apnea   . Ulcer (traumatic) of oral mucosa     Past Surgical History:  Procedure Laterality Date  . BACK SURGERY     cervical fusion x 2  . CARDIAC CATHETERIZATION    . CERVICAL DISCECTOMY     x 2  . COLONOSCOPY    . COLONOSCOPY N/A 07/25/2015   Procedure: COLONOSCOPY;  Surgeon: Lollie Sails, MD;  Location: [facility] ENDOSCOPY;  Service: Endoscopy;  Laterality: N/A;  . COLONOSCOPY WITH PROPOFOL N/A 10/04/2017   Procedure: COLONOSCOPY WITH PROPOFOL;  Surgeon: Lollie Sails, MD;  Location: [facility] ENDOSCOPY;  Service: Endoscopy;  Laterality: N/A;  . ELECTROMAGNETIC NAVIGATION  BROCHOSCOPY Left 06/28/2016   Procedure: ELECTROMAGNETIC NAVIGATION BRONCHOSCOPY;  Surgeon: Vilinda Boehringer, MD;  Location: [facility] ORS;  Service: Cardiopulmonary;  Laterality: Left;  . ENDOBRONCHIAL ULTRASOUND N/A 04/11/2018   Procedure: ENDOBRONCHIAL ULTRASOUND;  Surgeon: [name], MD;  Location: [facility] ORS;  Service: Cardiopulmonary;  Laterality: N/A;  . ESOPHAGOGASTRODUODENOSCOPY N/A 07/25/2015   Procedure: ESOPHAGOGASTRODUODENOSCOPY (EGD);  Surgeon: Lollie Sails, MD;  Location: [facility] ENDOSCOPY;  Service: Endoscopy;  Laterality: N/A;  . NASAL SINUS SURGERY     x 2   . PORTA CATH INSERTION N/A 04/24/2018   Procedure: PORTA CATH INSERTION;  Surgeon: Algernon Huxley, MD;  Location: [place] CV LAB;  Service: Cardiovascular;  Laterality: N/A;  . rotator cuff surgery Right    07/2016  . SEPTOPLASTY    . SKIN GRAFT      Social History Social History   Tobacco Use  . Smoking status: Current Every Day Smoker    Packs/day: 1.50    Years: 45.00    Pack years: 67.50    Types: Cigarettes  . Smokeless tobacco: Never Used  Substance Use Topics  . Alcohol use: Yes    Alcohol/week: 1.2 oz    Types: 2 Standard drinks or equivalent per week    Comment: moderate  . Drug use: No    Family History Family History  Problem Relation Age of Onset  . Heart disease Father   . Prostate cancer Father   . Heart disease Paternal Grandmother   . Heart attack Maternal Grandfather 52  . Kidney cancer Neg Hx   . Bladder Cancer Neg Hx   . Other Neg Hx        pituitary abnormality    Allergies  Allergen Reactions  . Lisinopril Rash  . Varenicline Rash     REVIEW OF SYSTEMS (Negative unless checked)  Constitutional: [] Weight loss  [] Fever  [] Chills Cardiac: [] Chest pain   [] Chest pressure   [] Palpitations   [] Shortness of breath when laying flat   [] Shortness of breath at rest   [] Shortness of breath with exertion. Vascular:  [] Pain in legs with walking   [] Pain in legs at rest   [] Pain in  legs when laying flat   [] Claudication   [] Pain in feet when walking  [] Pain in feet at rest  [] Pain in feet when laying flat   [] History of DVT   [] Phlebitis   [] Swelling in legs   [x] Varicose veins   [] Non-healing ulcers Pulmonary:   [] Uses home oxygen   [x] Productive cough   [] Hemoptysis   []   Wheeze  [x] COPD   [] Asthma Neurologic:  [] Dizziness  [] Blackouts   [] Seizures   [] History of stroke   [] History of TIA  [] Aphasia   [] Temporary blindness   [] Dysphagia   [] Weakness or numbness in arms   [] Weakness or numbness in legs Musculoskeletal:  [x] Arthritis   [] Joint swelling   [] Joint pain   [] Low back pain Hematologic:  [] Easy bruising  [] Easy bleeding   [] Hypercoagulable state   [] Anemic  [] Hepatitis Gastrointestinal:  [] Blood in stool   [] Vomiting blood  [x] Gastroesophageal reflux/heartburn   [] Difficulty swallowing. Genitourinary:  [] Chronic kidney disease   [] Difficult urination  [] Frequent urination  [] Burning with urination   [] Blood in urine Skin:  [] Rashes   [] Ulcers   [] Wounds Psychological:  [] History of anxiety   []  History of major depression.  Physical Examination  Vitals:   04/24/18 0912 04/24/18 0915 04/24/18 0930 04/24/18 0945  BP: 122/85 129/75 125/81 125/78  Pulse: 71 73 70 69  Resp: 15 16 16  (!) 21  Temp:      TempSrc:      SpO2: 93% 93% 94% 94%  Weight:      Height:       Body mass index is 26.72 kg/m. Gen: WD/WN, NAD Head: [place]/AT, No temporalis wasting. Ear/Nose/Throat: Hearing grossly intact, nares w/o erythema or drainage, oropharynx w/o Erythema/Exudate,  Eyes: Conjunctiva clear, sclera non-icteric Neck: Trachea midline.  No JVD.  Pulmonary:  Good air movement, respirations not labored, no use of accessory muscles.  Cardiac: RRR Vascular:  Vessel Right Left  Radial Palpable Palpable                                    Musculoskeletal: M/S 5/5 throughout.  Extremities without ischemic changes.  No deformity or atrophy.  Neurologic: Sensation grossly  intact in extremities.  Symmetrical.  Speech is fluent. Motor exam as listed above. Psychiatric: Judgment intact, Mood & affect appropriate for pt's clinical situation. Dermatologic: No rashes or ulcers noted.  No cellulitis or open wounds.      CBC Lab Results  Component Value Date   WBC 10.7 (H) 04/06/2018   HGB 15.8 04/06/2018   HCT 45.4 04/06/2018   MCV 99.9 04/06/2018   PLT 251 04/06/2018    BMET    Component Value Date/Time   NA 132 (L) 02/16/2018 1005   NA 136 12/25/2012 0951   K 3.9 04/06/2018 1122   K 4.0 12/25/2012 0951   CL 97 02/16/2018 1005   CL 102 12/25/2012 0951   CO2 29 02/16/2018 1005   CO2 31 12/25/2012 0951   GLUCOSE 77 02/16/2018 1005   GLUCOSE 86 12/25/2012 0951   BUN 7 02/16/2018 1005   BUN 10 12/25/2012 0951   CREATININE 0.77 02/16/2018 1005   CREATININE 0.88 12/25/2012 0951   CALCIUM 9.2 02/16/2018 1005   CALCIUM 8.9 12/25/2012 0951   GFRNONAA >60 02/04/2017 1254   GFRNONAA >60 12/25/2012 0951   GFRAA >60 02/04/2017 1254   GFRAA >60 12/25/2012 0951   CrCl cannot be calculated (Patient's most recent lab result is older than the maximum 21 days allowed.).  COAG Lab Results  Component Value Date   INR 1.11 09/09/2016   INR 0.89 06/01/2016    Radiology No results found.   Assessment/Plan 1. Lung cancer. For port placement.  Risks and benefits discussed and will proceed. 2. CAD. Monitor rhythm strip and oxygen sats throughout the procedure.  Nitrates as needed for chest pain.    Leotis Pain, MD  04/27/2018 2:07 PM

## 2018-04-28 MED ORDER — LIDOCAINE-PRILOCAINE 2.5-2.5 % EX CREA
TOPICAL_CREAM | CUTANEOUS | 3 refills | Status: DC
Start: 2018-04-28 — End: 2018-07-10

## 2018-04-28 MED ORDER — PROCHLORPERAZINE MALEATE 10 MG PO TABS: 10.0000 mg | ORAL_TABLET | Freq: Four times a day (QID) | ORAL | 2 refills | Status: DC | PRN

## 2018-04-28 MED ORDER — ONDANSETRON HCL 8 MG PO TABS: 8.0000 mg | ORAL_TABLET | Freq: Two times a day (BID) | ORAL | 2 refills | Status: DC | PRN

## 2018-04-28 NOTE — Progress Notes (Signed)
START ON PATHWAY REGIMEN - Non-Small Cell Lung     Administer weekly:     Paclitaxel      Carboplatin   **Always confirm dose/schedule in your pharmacy ordering system**  Patient Characteristics: Stage III - Unresectable, PS = 0, 1 AJCC T Category: T1b Current Disease Status: No Distant Mets or Local Recurrence AJCC N Category: N2 AJCC M Category: M0 AJCC 8 Stage Grouping: IIIA Performance Status: PS = 0, 1 Intent of Therapy: Curative Intent, Discussed with Patient 

## 2018-05-02 DIAGNOSIS — C3491 Malignant neoplasm of unspecified part of right bronchus or lung: Secondary | ICD-10-CM | POA: Insufficient documentation

## 2018-05-02 DIAGNOSIS — Z510 Encounter for antineoplastic radiation therapy: Secondary | ICD-10-CM | POA: Insufficient documentation

## 2018-05-02 NOTE — Progress Notes (Signed)
[facility]  [phone]) [phone] [phone]) [phone]  ID: [name] OB: [birth year]  MR#: [id number]  JOI#:[id number]  Patient Care Team: Kirk Ruths, MD as PCP - General (Internal Medicine)  CHIEF COMPLAINT: Stage IIIa non-small cell lung cancer, right middle lobe.  INTERVAL HISTORY: Patient returns to clinic today for further evaluation and initiation of cycle 1 of weekly carboplatinum and Taxol.  He continues to be highly anxious, but otherwise feels well.  He continues to smoke heavily.  He has no neurologic complaints. He denies any recent fevers or illnesses. He has no chest pain, cough, hemoptysis, or shortness of breath. He denies any nausea, vomiting, constipation, or diarrhea. He has no urinary complaints.  Patient offers no further specific complaints today.  REVIEW OF SYSTEMS:   Review of Systems  Constitutional: Negative.  Negative for fever, malaise/fatigue and weight loss.  Respiratory: Negative.  Negative for cough, hemoptysis and shortness of breath.   Cardiovascular: Negative.  Negative for chest pain and leg swelling.  Gastrointestinal: Negative.  Negative for abdominal pain, blood in stool and melena.  Genitourinary: Negative.  Negative for dysuria.  Musculoskeletal: Negative.  Negative for joint pain.  Skin: Negative.  Negative for rash.  Neurological: Negative.  Negative for sensory change, focal weakness and weakness.  Psychiatric/Behavioral: The patient is nervous/anxious.     As per HPI. Otherwise, a complete review of systems is negative.  PAST MEDICAL HISTORY: Past Medical History:  Diagnosis Date  . Anginal pain ([place])   . Anxiety   . Chest pain   . Chicken pox   . Complication of anesthesia    o2 dropped after neck fusion  . COPD (chronic obstructive pulmonary disease) ([place])   . Coronary artery disease   . Cough    chronic  clear phlegm  . Dysrhythmia    palpitations  . GERD (gastroesophageal reflux disease)      h/o reflux/ hoarsness  . Hematochezia   . Hemorrhoids   . History of chickenpox   . History of colon polyps   . History of Helicobacter pylori infection   . Hoarseness   . Hypertension   . Migraines   . OSA (obstructive sleep apnea)    has CPAP but does not use  . Personal history of tobacco use, presenting hazards to health 03/05/2016  . Pneumonia    5/17  . Raynaud disease   . Raynaud disease   . Raynaud's disease   . Rotator cuff tear    on right  . Shortness of breath dyspnea   . Sleep apnea   . Ulcer (traumatic) of oral mucosa     PAST SURGICAL HISTORY: Past Surgical History:  Procedure Laterality Date  . BACK SURGERY     cervical fusion x 2  . CARDIAC CATHETERIZATION    . CERVICAL DISCECTOMY     x 2  . COLONOSCOPY    . COLONOSCOPY N/A 07/25/2015   Procedure: COLONOSCOPY;  Surgeon: Lollie Sails, MD;  Location: [facility] ENDOSCOPY;  Service: Endoscopy;  Laterality: N/A;  . COLONOSCOPY WITH PROPOFOL N/A 10/04/2017   Procedure: COLONOSCOPY WITH PROPOFOL;  Surgeon: Lollie Sails, MD;  Location: [facility] ENDOSCOPY;  Service: Endoscopy;  Laterality: N/A;  . ELECTROMAGNETIC NAVIGATION BROCHOSCOPY Left 06/28/2016   Procedure: ELECTROMAGNETIC NAVIGATION BRONCHOSCOPY;  Surgeon: Vilinda Boehringer, MD;  Location: [facility] ORS;  Service: Cardiopulmonary;  Laterality: Left;  . ENDOBRONCHIAL ULTRASOUND N/A 04/11/2018   Procedure: ENDOBRONCHIAL ULTRASOUND;  Surgeon: [name], MD;  Location: [facility] ORS;  Service: Cardiopulmonary;  Laterality: N/A;  . ESOPHAGOGASTRODUODENOSCOPY N/A 07/25/2015   Procedure: ESOPHAGOGASTRODUODENOSCOPY (EGD);  Surgeon: Lollie Sails, MD;  Location: [facility] ENDOSCOPY;  Service: Endoscopy;  Laterality: N/A;  . NASAL SINUS SURGERY     x 2   . PORTA CATH INSERTION N/A 04/24/2018   Procedure: PORTA CATH INSERTION;  Surgeon: Algernon Huxley, MD;  Location: [place] CV LAB;  Service: Cardiovascular;  Laterality: N/A;  . rotator cuff surgery Right    07/2016  .  SEPTOPLASTY    . SKIN GRAFT      FAMILY HISTORY Family History  Problem Relation Age of Onset  . Heart disease Father   . Prostate cancer Father   . Heart disease Paternal Grandmother   . Heart attack Maternal Grandfather 52  . Kidney cancer Neg Hx   . Bladder Cancer Neg Hx   . Other Neg Hx        pituitary abnormality       ADVANCED DIRECTIVES:    HEALTH MAINTENANCE: Social History   Tobacco Use  . Smoking status: Current Every Day Smoker    Packs/day: 1.50    Years: 45.00    Pack years: 67.50    Types: Cigarettes  . Smokeless tobacco: Never Used  Substance Use Topics  . Alcohol use: Yes    Alcohol/week: 1.2 oz    Types: 2 Standard drinks or equivalent per week    Comment: moderate  . Drug use: No     Allergies  Allergen Reactions  . Lisinopril Rash  . Varenicline Rash    Current Outpatient Medications  Medication Sig Dispense Refill  . atorvastatin (LIPITOR) 40 MG tablet Take 40 mg by mouth at bedtime.     Marland Kitchen BREO ELLIPTA 200-25 MCG/INH AEPB TAKE 1 PUFF BY MOUTH EVERY DAY 60 each 6  . diazepam (VALIUM) 5 MG tablet Take 5 mg by mouth every 12 (twelve) hours as needed for anxiety (SCHEDULED EVERY NIGHT).     . DULoxetine (CYMBALTA) 60 MG capsule Take 60 mg by mouth daily.     Marland Kitchen lidocaine-prilocaine (EMLA) cream Apply to affected area once 30 g 3  . metoprolol tartrate (LOPRESSOR) 50 MG tablet Take 50 mg by mouth 2 (two) times daily.    . mirabegron ER (MYRBETRIQ) 50 MG TB24 tablet Take 1 tablet (50 mg total) by mouth daily. 90 tablet 3  . OXYGEN Inhale 2 L into the lungs at bedtime.     . pantoprazole (PROTONIX) 40 MG tablet Take 40 mg by mouth daily.  3  . potassium chloride (K-DUR,KLOR-CON) 10 MEQ tablet Take 10 mEq by mouth daily.    Marland Kitchen SPIRIVA RESPIMAT 2.5 MCG/ACT AERS INHALE 2 PUFFS INTO THE LUNGS DAILY. 4 g 6  . acetaminophen (TYLENOL) 500 MG tablet Take 1,000 mg by mouth every 6 (six) hours as needed (for pain.).    Marland Kitchen albuterol (PROAIR HFA) 108 (90  Base) MCG/ACT inhaler Inhale 2 puffs into the lungs every 6 (six) hours as needed for wheezing or shortness of breath. (Patient not taking: Reported on 05/03/2018) 1 Inhaler 3  . fluticasone (FLONASE) 50 MCG/ACT nasal spray Place 1 spray into both nostrils daily as needed (for allergies.).     Marland Kitchen ipratropium-albuterol (DUONEB) 0.5-2.5 (3) MG/3ML SOLN Take 3 mLs by nebulization every 6 (six) hours. (Patient not taking: Reported on 05/03/2018) 360 mL 5  . Multiple Vitamin (MULTIVITAMIN WITH MINERALS) TABS tablet Take 1 tablet by mouth at bedtime.    . nicotine (  NICODERM CQ - DOSED IN MG/24 HOURS) 21 mg/24hr patch Place 1 patch (21 mg total) onto the skin daily. (Patient not taking: Reported on 05/03/2018) 30 patch 12  . nitroGLYCERIN (NITROSTAT) 0.4 MG SL tablet Place 0.4 mg under the tongue every 5 (five) minutes as needed for chest pain.     Marland Kitchen ondansetron (ZOFRAN) 8 MG tablet Take 1 tablet (8 mg total) by mouth 2 (two) times daily as needed for refractory nausea / vomiting. (Patient not taking: Reported on 05/03/2018) 30 tablet 2  . prochlorperazine (COMPAZINE) 10 MG tablet Take 1 tablet (10 mg total) by mouth every 6 (six) hours as needed (Nausea or vomiting). (Patient not taking: Reported on 05/03/2018) 60 tablet 2   No current facility-administered medications for this visit.     OBJECTIVE: Vitals:   05/03/18 1021  BP: 133/81  Pulse: 70  Resp: 18  Temp: (!) 95.7 F (35.4 C)     Body mass index is 26.54 kg/m.    ECOG FS:0 - Asymptomatic  General: Well-developed, well-nourished, no acute distress. Eyes: Pink conjunctiva, anicteric sclera. Lungs: Clear to auscultation bilaterally. Heart: Regular rate and rhythm. No rubs, murmurs, or gallops. Abdomen: Soft, nontender, nondistended. No organomegaly noted, normoactive bowel sounds. Musculoskeletal: No edema, cyanosis, or clubbing. Neuro: Alert, answering all questions appropriately. Cranial nerves grossly intact. Skin: No rashes or petechiae  noted. Psych: Normal affect.   LAB RESULTS:  Lab Results  Component Value Date   NA 130 (L) 05/03/2018   K 4.2 05/03/2018   CL 97 (L) 05/03/2018   CO2 25 05/03/2018   GLUCOSE 102 (H) 05/03/2018   BUN 9 05/03/2018   CREATININE 0.80 05/03/2018   CALCIUM 9.0 05/03/2018   PROT 7.0 05/03/2018   ALBUMIN 4.1 05/03/2018   AST 27 05/03/2018   ALT 27 05/03/2018   ALKPHOS 88 05/03/2018   BILITOT 0.7 05/03/2018   GFRNONAA >60 05/03/2018   GFRAA >60 05/03/2018    Lab Results  Component Value Date   WBC 10.7 (H) 05/03/2018   NEUTROABS 8.1 (H) 05/03/2018   HGB 16.0 05/03/2018   HCT 44.8 05/03/2018   MCV 97.1 05/03/2018   PLT 263 05/03/2018     STUDIES:  ASSESSMENT: Stage IIIa non-small cell lung cancer, right middle lobe.  PLAN:    1. Stage IIIa non-small cell lung cancer, right middle lobe: Case discussed with pathologist and unable to determine whether this is adenocarcinoma or squamous carcinoma.  There is also insufficient tissue to do further testing.  Because of this, I have ordered "liquid biopsy" to assess if there are any actionable mutations.  PET scan results from Mar 16, 2018 reviewed independently confirming stage of disease.  MRI of the brain completed on Mar 28, 2018 reviewed independently did not reveal metastatic disease.  Patient will benefit from concurrent chemotherapy and XRT with weekly carboplatin and Taxol.  At the conclusion of his XRT, will proceed with maintenance Imfinzi every 2 weeks for up to 12 months.  Proceed with cycle 1 of weekly carboplatinum and Taxol today.  Patient will initiate XRT on May 10, 2018.  Return to clinic in 1 week for further evaluation and consideration of cycle 2. 2.  Secondary polycythemia: Resolved.  Hemoglobin 16.0 today.  Likely secondary to heavy tobacco use. Patient was also taking testosterone, but this has now been discontinued.  Previously, the remainder of his laboratory work, including JAK-2 mutation, was either negative  or within normal limits.  3. Left upper lobe pulmonary nodule: Imaging  results as above.  Previously, biopsy completed on October 04, 2017 was negative for malignancy.  4.  Colon polyps:  Patient has a personal history of greater then 10 adenomatous polyps on his most recent conoloscopy. He does not know of any family history of increased polyps or colon cancer.  Genetic testing to assess for the APC mutation for FAP or AFAP was negative. Continue colonoscopies as per GI. 5. Tobacco Use: Patient continues to heavily smoke, but has decreased significantly. 6. Anxiety: Patient will have IV Ativan with each treatment.  Continue Xanax at home as needed.   Patient expressed understanding and was in agreement with this plan. He also understands that He can call clinic at any time with any questions, concerns, or complaints.    [name], MD   05/05/2018 6:56 AM   Addendum: Patient had a reaction to Taxol today and this was discontinued.  He tolerated carboplatinum without problem.  Return to clinic as above for continuation of treatment.

## 2018-05-03 ENCOUNTER — Encounter: Payer: Self-pay | Admitting: *Deleted

## 2018-05-03 ENCOUNTER — Other Ambulatory Visit: Payer: Self-pay

## 2018-05-03 ENCOUNTER — Inpatient Hospital Stay: Payer: BLUE CROSS/BLUE SHIELD

## 2018-05-03 ENCOUNTER — Inpatient Hospital Stay: Payer: BLUE CROSS/BLUE SHIELD | Attending: Oncology

## 2018-05-03 ENCOUNTER — Inpatient Hospital Stay (HOSPITAL_BASED_OUTPATIENT_CLINIC_OR_DEPARTMENT_OTHER): Payer: BLUE CROSS/BLUE SHIELD | Admitting: Oncology

## 2018-05-03 VITALS — BP 133/81 | HR 70 | Temp 95.7°F | Resp 18 | Wt 195.7 lb

## 2018-05-03 VITALS — BP 130/85 | HR 79 | Temp 95.1°F | Resp 18

## 2018-05-03 DIAGNOSIS — C3491 Malignant neoplasm of unspecified part of right bronchus or lung: Secondary | ICD-10-CM

## 2018-05-03 DIAGNOSIS — E871 Hypo-osmolality and hyponatremia: Secondary | ICD-10-CM | POA: Insufficient documentation

## 2018-05-03 DIAGNOSIS — Z86010 Personal history of colonic polyps: Secondary | ICD-10-CM | POA: Diagnosis not present

## 2018-05-03 DIAGNOSIS — I2510 Atherosclerotic heart disease of native coronary artery without angina pectoris: Secondary | ICD-10-CM | POA: Diagnosis not present

## 2018-05-03 DIAGNOSIS — R5381 Other malaise: Secondary | ICD-10-CM | POA: Diagnosis not present

## 2018-05-03 DIAGNOSIS — Z79899 Other long term (current) drug therapy: Secondary | ICD-10-CM | POA: Insufficient documentation

## 2018-05-03 DIAGNOSIS — C342 Malignant neoplasm of middle lobe, bronchus or lung: Secondary | ICD-10-CM | POA: Diagnosis present

## 2018-05-03 DIAGNOSIS — Z9981 Dependence on supplemental oxygen: Secondary | ICD-10-CM | POA: Diagnosis not present

## 2018-05-03 DIAGNOSIS — R5383 Other fatigue: Secondary | ICD-10-CM | POA: Insufficient documentation

## 2018-05-03 DIAGNOSIS — G4733 Obstructive sleep apnea (adult) (pediatric): Secondary | ICD-10-CM | POA: Diagnosis not present

## 2018-05-03 DIAGNOSIS — F17210 Nicotine dependence, cigarettes, uncomplicated: Secondary | ICD-10-CM | POA: Insufficient documentation

## 2018-05-03 DIAGNOSIS — I7300 Raynaud's syndrome without gangrene: Secondary | ICD-10-CM | POA: Diagnosis not present

## 2018-05-03 DIAGNOSIS — Z5111 Encounter for antineoplastic chemotherapy: Secondary | ICD-10-CM | POA: Diagnosis not present

## 2018-05-03 DIAGNOSIS — K219 Gastro-esophageal reflux disease without esophagitis: Secondary | ICD-10-CM | POA: Insufficient documentation

## 2018-05-03 DIAGNOSIS — T451X5A Adverse effect of antineoplastic and immunosuppressive drugs, initial encounter: Secondary | ICD-10-CM | POA: Diagnosis not present

## 2018-05-03 DIAGNOSIS — F419 Anxiety disorder, unspecified: Secondary | ICD-10-CM | POA: Diagnosis not present

## 2018-05-03 DIAGNOSIS — Z8042 Family history of malignant neoplasm of prostate: Secondary | ICD-10-CM | POA: Insufficient documentation

## 2018-05-03 DIAGNOSIS — R531 Weakness: Secondary | ICD-10-CM | POA: Diagnosis not present

## 2018-05-03 DIAGNOSIS — J449 Chronic obstructive pulmonary disease, unspecified: Secondary | ICD-10-CM | POA: Insufficient documentation

## 2018-05-03 DIAGNOSIS — I10 Essential (primary) hypertension: Secondary | ICD-10-CM | POA: Insufficient documentation

## 2018-05-03 DIAGNOSIS — R4702 Dysphasia: Secondary | ICD-10-CM | POA: Insufficient documentation

## 2018-05-03 LAB — CBC WITH DIFFERENTIAL/PLATELET
BASOS ABS: 0.1 10*3/uL (ref 0–0.1)
BASOS PCT: 1 %
EOS ABS: 0.1 10*3/uL (ref 0–0.7)
EOS PCT: 1 %
HEMATOCRIT: 44.8 % (ref 39.0–52.0)
Hemoglobin: 16 g/dL (ref 13.0–17.0)
Lymphocytes Relative: 15 %
Lymphs Abs: 1.6 10*3/uL (ref 1.0–3.6)
MCH: 34.8 pg — ABNORMAL HIGH (ref 26.0–34.0)
MCHC: 35.8 g/dL (ref 32.0–36.0)
MCV: 97.1 fL (ref 80.0–100.0)
MONO ABS: 0.7 10*3/uL (ref 0.2–1.0)
MONOS PCT: 7 %
Neutro Abs: 8.1 10*3/uL — ABNORMAL HIGH (ref 1.4–6.5)
Neutrophils Relative %: 76 %
PLATELETS: 263 10*3/uL (ref 150–440)
RBC: 4.61 MIL/uL (ref 4.40–5.90)
RDW: 12.6 % (ref 11.5–14.5)
WBC: 10.7 10*3/uL — ABNORMAL HIGH (ref 3.8–10.6)

## 2018-05-03 LAB — COMPREHENSIVE METABOLIC PANEL
ALBUMIN: 4.1 g/dL (ref 3.5–5.0)
ALK PHOS: 88 U/L (ref 38–126)
ALT: 27 U/L (ref 0–44)
ANION GAP: 8 (ref 5–15)
AST: 27 U/L (ref 15–41)
BILIRUBIN TOTAL: 0.7 mg/dL (ref 0.3–1.2)
BUN: 9 mg/dL (ref 6–20)
CALCIUM: 9 mg/dL (ref 8.9–10.3)
CO2: 25 mmol/L (ref 22–32)
CREATININE: 0.8 mg/dL (ref 0.61–1.24)
Chloride: 97 mmol/L — ABNORMAL LOW (ref 98–111)
GFR calc Af Amer: >60 mL/min (ref >60)
GFR calc non Af Amer: >60 mL/min (ref >60)
GLUCOSE: 102 mg/dL — AB (ref 70–99)
Potassium: 4.2 mmol/L (ref 3.5–5.1)
SODIUM: 130 mmol/L — AB (ref 135–145)
TOTAL PROTEIN: 7 g/dL (ref 6.5–8.1)

## 2018-05-03 MED ORDER — SODIUM CHLORIDE 0.9 % IV SOLN: 299.4000 mg | Freq: Once | INTRAVENOUS | Status: DC

## 2018-05-03 MED ORDER — SODIUM CHLORIDE 0.9 % IV SOLN
Freq: Once | INTRAVENOUS | Status: AC | PRN
  Administered 2018-05-03: 13:00:00 via INTRAVENOUS
  Filled 2018-05-03: qty 1000

## 2018-05-03 MED ORDER — DIPHENHYDRAMINE HCL 50 MG/ML IJ SOLN
25.0000 mg | Freq: Once | INTRAMUSCULAR | Status: AC
  Administered 2018-05-03: 25 mg via INTRAVENOUS
  Filled 2018-05-03: qty 1

## 2018-05-03 MED ORDER — FAMOTIDINE IN NACL 20-0.9 MG/50ML-% IV SOLN
20.0000 mg | Freq: Once | INTRAVENOUS | Status: AC
  Administered 2018-05-03: 20 mg via INTRAVENOUS
  Filled 2018-05-03: qty 50

## 2018-05-03 MED ORDER — SODIUM CHLORIDE 0.9% FLUSH
10.0000 mL | INTRAVENOUS | Status: DC | PRN
  Filled 2018-05-03: qty 10

## 2018-05-03 MED ORDER — DEXAMETHASONE SODIUM PHOSPHATE 10 MG/ML IJ SOLN
10.0000 mg | Freq: Once | INTRAMUSCULAR | Status: AC
  Administered 2018-05-03: 10 mg via INTRAVENOUS
  Filled 2018-05-03: qty 1

## 2018-05-03 MED ORDER — HEPARIN SOD (PORK) LOCK FLUSH 100 UNIT/ML IV SOLN: 500.0000 [IU] | Freq: Once | INTRAVENOUS | Status: DC | PRN

## 2018-05-03 MED ORDER — SODIUM CHLORIDE 0.9 % IV SOLN: 10.0000 mg | Freq: Once | INTRAVENOUS | Status: DC

## 2018-05-03 MED ORDER — PALONOSETRON HCL INJECTION 0.25 MG/5ML
0.2500 mg | Freq: Once | INTRAVENOUS | Status: AC
  Administered 2018-05-03: 0.25 mg via INTRAVENOUS
  Filled 2018-05-03: qty 5

## 2018-05-03 MED ORDER — SODIUM CHLORIDE 0.9 % IV SOLN
45.0000 mg/m2 | Freq: Once | INTRAVENOUS | Status: AC
  Administered 2018-05-03: 96 mg via INTRAVENOUS
  Filled 2018-05-03: qty 16

## 2018-05-03 MED ORDER — SODIUM CHLORIDE 0.9% FLUSH
10.0000 mL | Freq: Once | INTRAVENOUS | Status: AC
  Administered 2018-05-03: 10 mL via INTRAVENOUS
  Filled 2018-05-03: qty 10

## 2018-05-03 MED ORDER — DIPHENHYDRAMINE HCL 50 MG/ML IJ SOLN
25.0000 mg | Freq: Once | INTRAMUSCULAR | Status: AC | PRN
  Administered 2018-05-03: 25 mg via INTRAVENOUS

## 2018-05-03 MED ORDER — SODIUM CHLORIDE 0.9 % IV SOLN
Freq: Once | INTRAVENOUS | Status: AC
  Administered 2018-05-03: 11:00:00 via INTRAVENOUS
  Filled 2018-05-03: qty 1000

## 2018-05-03 MED ORDER — SODIUM CHLORIDE 0.9 % IV SOLN
299.4000 mg | Freq: Once | INTRAVENOUS | Status: AC
  Administered 2018-05-03: 300 mg via INTRAVENOUS
  Filled 2018-05-03: qty 30

## 2018-05-03 MED ORDER — LORAZEPAM 2 MG/ML IJ SOLN
0.5000 mg | Freq: Once | INTRAMUSCULAR | Status: AC
  Administered 2018-05-03: 0.5 mg via INTRAVENOUS
  Filled 2018-05-03: qty 1

## 2018-05-03 MED ORDER — HEPARIN SOD (PORK) LOCK FLUSH 100 UNIT/ML IV SOLN
500.0000 [IU] | Freq: Once | INTRAVENOUS | Status: AC
  Administered 2018-05-03: 500 [IU] via INTRAVENOUS
  Filled 2018-05-03: qty 5

## 2018-05-03 MED ORDER — METHYLPREDNISOLONE SODIUM SUCC 125 MG IJ SOLR
125.0000 mg | Freq: Once | INTRAMUSCULAR | Status: AC | PRN
  Administered 2018-05-03: 125 mg via INTRAVENOUS

## 2018-05-03 NOTE — Progress Notes (Signed)
  Oncology Nurse Navigator Documentation  Navigator Location: [facility] (05/03/18 1100)   )Navigator Encounter Type: Treatment (05/03/18 1100)                   Treatment Initiated Date: 05/03/18 (05/03/18 1100) Patient Visit Type: MedOnc (05/03/18 1100) Treatment Phase: First Chemo Tx (05/03/18 1100) Barriers/Navigation Needs: No barriers at this time (05/03/18 1100)   Interventions: None required (05/03/18 1100)                      Time Spent with Patient: 30 (05/03/18 1100)

## 2018-05-03 NOTE — Progress Notes (Signed)
1255: Pt reports headache, chest/upper abdominal burning and nausea and itching. Taxol stopped and NS started wide open, Dr. Grayland Ormond at chairside, VS stable, see MAR for medication administration. Per Dr. Grayland Ormond Do not restart Taxol and okay to resume with Carboplatin. 1345: Pt reports that all symptoms have resolved.  Pt tolerated Carboplatin infusion well. Pt stable at discharge.

## 2018-05-03 NOTE — Progress Notes (Signed)
Pt here for follow up" Im extremely nervous today-for chemo #1 " support given.

## 2018-05-07 NOTE — Progress Notes (Signed)
Paul Carpenter  [phone] Fax:(336) [phone]  ID: Paul Carpenter OB: 09/01/1956  MR#: [id number]  ASN#:[id number]  Patient Care Team: Kirk Ruths, MD as PCP - General (Internal Medicine)  CHIEF COMPLAINT: Stage IIIa non-small cell lung cancer, right middle lobe.  INTERVAL HISTORY: Patient returns to clinic today for further evaluation and consideration of cycle 2 of single agent carboplatinum.  He had a reaction to Taxol during his first infusion and this has been discontinued.  He continues to be highly anxious, but otherwise feels well.  He continues to smoke heavily. He has no neurologic complaints. He denies any recent fevers or illnesses. He has no chest pain, cough, hemoptysis, or shortness of breath. He denies any nausea, vomiting, constipation, or diarrhea. He has no urinary complaints.  Patient offers no further specific complaints today.  REVIEW OF SYSTEMS:   Review of Systems  Constitutional: Negative.  Negative for fever, malaise/fatigue and weight loss.  Respiratory: Negative.  Negative for cough, hemoptysis and shortness of breath.   Cardiovascular: Negative.  Negative for chest pain and leg swelling.  Gastrointestinal: Negative.  Negative for abdominal pain, blood in stool and melena.  Genitourinary: Negative.  Negative for dysuria.  Musculoskeletal: Negative.  Negative for joint pain.  Skin: Negative.  Negative for rash.  Neurological: Negative.  Negative for sensory change, focal weakness and weakness.  Psychiatric/Behavioral: The patient is nervous/anxious.     As per HPI. Otherwise, a complete review of systems is negative.  PAST MEDICAL HISTORY: Past Medical History:  Diagnosis Date  . Anginal pain ([place])   . Anxiety   . Chest pain   . Chicken pox   . Complication of anesthesia    o2 dropped after neck fusion  . COPD (chronic obstructive pulmonary disease) ([place])   . Coronary artery disease   . Cough    chronic  clear  phlegm  . Dysrhythmia    palpitations  . GERD (gastroesophageal reflux disease)    h/o reflux/ hoarsness  . Hematochezia   . Hemorrhoids   . History of chickenpox   . History of colon polyps   . History of Helicobacter pylori infection   . Hoarseness   . Hypertension   . Migraines   . OSA (obstructive sleep apnea)    has CPAP but does not use  . Personal history of tobacco use, presenting hazards to health 03/05/2016  . Pneumonia    5/17  . Raynaud disease   . Raynaud disease   . Raynaud's disease   . Rotator cuff tear    on right  . Shortness of breath dyspnea   . Sleep apnea   . Ulcer (traumatic) of oral mucosa     PAST SURGICAL HISTORY: Past Surgical History:  Procedure Laterality Date  . BACK SURGERY     cervical fusion x 2  . CARDIAC CATHETERIZATION    . CERVICAL DISCECTOMY     x 2  . COLONOSCOPY    . COLONOSCOPY N/A 07/25/2015   Procedure: COLONOSCOPY;  Surgeon: Lollie Sails, MD;  Location: [facility] ENDOSCOPY;  Service: Endoscopy;  Laterality: N/A;  . COLONOSCOPY WITH PROPOFOL N/A 10/04/2017   Procedure: COLONOSCOPY WITH PROPOFOL;  Surgeon: Lollie Sails, MD;  Location[facility] ENDOSCOPY;  Service: Endoscopy;  Laterality: N/A;  . ELECTROMAGNETIC NAVIGATION BROCHOSCOPY Left 06/28/2016   Procedure: ELECTROMAGNETIC NAVIGATION BRONCHOSCOPY;  Surgeon: Vilinda Boehringer, MD;  Location: [facility] ORS;  Service: Cardiopulmonary;  Laterality: Left;  . ENDOBRONCHIAL ULTRASOUND N/A 04/11/2018  Procedure: ENDOBRONCHIAL ULTRASOUND;  Surgeon: [name], MD;  Location: [facility] ORS;  Service: Cardiopulmonary;  Laterality: N/A;  . ESOPHAGOGASTRODUODENOSCOPY N/A 07/25/2015   Procedure: ESOPHAGOGASTRODUODENOSCOPY (EGD);  Surgeon: Lollie Sails, MD;  Location: [facility] ENDOSCOPY;  Service: Endoscopy;  Laterality: N/A;  . NASAL SINUS SURGERY     x 2   . PORTA CATH INSERTION N/A 04/24/2018   Procedure: PORTA CATH INSERTION;  Surgeon: Algernon Huxley, MD;  Location: [place] CV LAB;  Service:  Cardiovascular;  Laterality: N/A;  . rotator cuff surgery Right    07/2016  . SEPTOPLASTY    . SKIN GRAFT      FAMILY HISTORY Family History  Problem Relation Age of Onset  . Heart disease Father   . Prostate cancer Father   . Heart disease Paternal Grandmother   . Heart attack Maternal Grandfather 52  . Kidney cancer Neg Hx   . Bladder Cancer Neg Hx   . Other Neg Hx        pituitary abnormality       ADVANCED DIRECTIVES:    HEALTH MAINTENANCE: Social History   Tobacco Use  . Smoking status: Current Every Day Smoker    Packs/day: 1.50    Years: 45.00    Pack years: 67.50    Types: Cigarettes  . Smokeless tobacco: Never Used  Substance Use Topics  . Alcohol use: Yes    Alcohol/week: 1.2 oz    Types: 2 Standard drinks or equivalent per week    Comment: moderate  . Drug use: No     Allergies  Allergen Reactions  . Lisinopril Rash  . Varenicline Rash    Current Outpatient Medications  Medication Sig Dispense Refill  . albuterol (PROAIR HFA) 108 (90 Base) MCG/ACT inhaler Inhale 2 puffs into the lungs every 6 (six) hours as needed for wheezing or shortness of breath. 1 Inhaler 3  . atorvastatin (LIPITOR) 40 MG tablet Take 40 mg by mouth at bedtime.     Marland Kitchen BREO ELLIPTA 200-25 MCG/INH AEPB TAKE 1 PUFF BY MOUTH EVERY DAY 60 each 6  . busPIRone (BUSPAR) 15 MG tablet   10  . diazepam (VALIUM) 5 MG tablet Take 5 mg by mouth every 12 (twelve) hours as needed for anxiety (SCHEDULED EVERY NIGHT).     . DULoxetine (CYMBALTA) 60 MG capsule Take 60 mg by mouth daily.     . fluticasone (FLONASE) 50 MCG/ACT nasal spray Place 1 spray into both nostrils daily as needed (for allergies.).     Marland Kitchen ipratropium-albuterol (DUONEB) 0.5-2.5 (3) MG/3ML SOLN Take 3 mLs by nebulization every 6 (six) hours. 360 mL 5  . lidocaine-prilocaine (EMLA) cream Apply to affected area once 30 g 3  . metoprolol tartrate (LOPRESSOR) 50 MG tablet Take 50 mg by mouth 2 (two) times daily.    . mirabegron ER  (MYRBETRIQ) 50 MG TB24 tablet Take 1 tablet (50 mg total) by mouth daily. 90 tablet 3  . Multiple Vitamin (MULTIVITAMIN WITH MINERALS) TABS tablet Take 1 tablet by mouth at bedtime.    . nicotine (NICODERM CQ - DOSED IN MG/24 HOURS) 21 mg/24hr patch Place 1 patch (21 mg total) onto the skin daily. 30 patch 12  . nitroGLYCERIN (NITROSTAT) 0.4 MG SL tablet Place 0.4 mg under the tongue every 5 (five) minutes as needed for chest pain.     Marland Kitchen ondansetron (ZOFRAN) 8 MG tablet Take 1 tablet (8 mg total) by mouth 2 (two) times daily as needed for refractory nausea /  vomiting. 30 tablet 2  . OXYGEN Inhale 2 L into the lungs at bedtime.     . pantoprazole (PROTONIX) 40 MG tablet Take 40 mg by mouth daily.  3  . potassium chloride (K-DUR,KLOR-CON) 10 MEQ tablet Take 10 mEq by mouth daily.    . prochlorperazine (COMPAZINE) 10 MG tablet Take 1 tablet (10 mg total) by mouth every 6 (six) hours as needed (Nausea or vomiting). 60 tablet 2  . SPIRIVA RESPIMAT 2.5 MCG/ACT AERS INHALE 2 PUFFS INTO THE LUNGS DAILY. 4 g 6  . acetaminophen (TYLENOL) 500 MG tablet Take 1,000 mg by mouth every 6 (six) hours as needed (for pain.).     No current facility-administered medications for this visit.     OBJECTIVE: There were no vitals filed for this visit.   There is no height or weight on file to calculate BMI.    ECOG FS:0 - Asymptomatic  General: Well-developed, well-nourished, no acute distress. Eyes: Pink conjunctiva, anicteric sclera. Lungs: Clear to auscultation bilaterally. Heart: Regular rate and rhythm. No rubs, murmurs, or gallops. Abdomen: Soft, nontender, nondistended. No organomegaly noted, normoactive bowel sounds. Musculoskeletal: No edema, cyanosis, or clubbing. Neuro: Alert, answering all questions appropriately. Cranial nerves grossly intact. Skin: No rashes or petechiae noted. Psych: Normal affect.  LAB RESULTS:  Lab Results  Component Value Date   NA 132 (L) 05/11/2018   K 4.3 05/11/2018    CL 98 05/11/2018   CO2 24 05/11/2018   GLUCOSE 104 (H) 05/11/2018   BUN 11 05/11/2018   CREATININE 0.72 05/11/2018   CALCIUM 9.0 05/11/2018   PROT 6.8 05/11/2018   ALBUMIN 4.2 05/11/2018   AST 26 05/11/2018   ALT 29 05/11/2018   ALKPHOS 85 05/11/2018   BILITOT 0.6 05/11/2018   GFRNONAA >60 05/11/2018   GFRAA >60 05/11/2018    Lab Results  Component Value Date   WBC 11.2 (H) 05/11/2018   NEUTROABS 8.6 (H) 05/11/2018   HGB 15.7 05/11/2018   HCT 44.2 05/11/2018   MCV 97.8 05/11/2018   PLT 269 05/11/2018     STUDIES:  ASSESSMENT: Stage IIIa non-small cell lung cancer, right middle lobe.  PLAN:    1. Stage IIIa non-small cell lung cancer, right middle lobe: Case discussed with pathologist and unable to determine whether this is adenocarcinoma or squamous carcinoma.  There is also insufficient tissue to do further testing.  Because of this, I have ordered "liquid biopsy" to assess if there are any actionable mutations.  These results are pending.  PET scan results from Mar 16, 2018 reviewed independently confirming stage of disease.  MRI of the brain completed on Mar 28, 2018 reviewed independently did not reveal metastatic disease.  Continue daily XRT.  Patient had a reaction to Taxol during cycle 1, therefore this is was discontinued.  Proceed with cycle 2 of weekly carboplatin today.  At the conclusion of his XRT, will proceed with maintenance Imfinzi every 2 weeks for up to 12 months.  Return to clinic in 1 week for further evaluation and consideration of cycle 3.   2.  Secondary polycythemia: Resolved.  Patient's hemoglobin is 15.7 today.  Likely secondary to heavy tobacco use.  Previously, the remainder of his laboratory work, including JAK-2 mutation, was either negative or within normal limits.  3. Left upper lobe pulmonary nodule: Imaging results as above.  Previously, biopsy completed on October 04, 2017 was negative for malignancy.  4.  Colon polyps:  Patient has a personal  history of greater  then 10 adenomatous polyps on his most recent conoloscopy. He does not know of any family history of increased polyps or colon cancer.  Genetic testing to assess for the APC mutation for FAP or AFAP was negative. Continue colonoscopies as per GI. 5. Tobacco Use: Patient continues to heavily smoke, but has decreased significantly. 6. Anxiety: Patient will have IV Ativan with each treatment.  Continue treatment and evaluation per primary care.  Patient expressed understanding and was in agreement with this plan. He also understands that He can call clinic at any time with any questions, concerns, or complaints.    [name], MD   05/14/2018 7:36 AM

## 2018-05-10 ENCOUNTER — Ambulatory Visit
Admission: RE | Admit: 2018-05-10 | Discharge: 2018-05-10 | Disposition: A | Discharge status: Home / Self Care | Payer: BLUE CROSS/BLUE SHIELD | Source: Ambulatory Visit | Attending: Radiation Oncology | Admitting: Radiation Oncology

## 2018-05-10 ENCOUNTER — Telehealth: Payer: Self-pay | Admitting: *Deleted

## 2018-05-10 NOTE — Telephone Encounter (Signed)
Patient called and states PCP ordered a new medicine for him and he needs to know if it will interfere with his chemotherapy and radiation therapy. The medicine is Buspar 15 mg twice a day . Please advise

## 2018-05-10 NOTE — Telephone Encounter (Signed)
Per VO Dr Grayland Ormond, medicine fine to take. Patient informed and he repeated back to me

## 2018-05-11 ENCOUNTER — Inpatient Hospital Stay: Payer: BLUE CROSS/BLUE SHIELD

## 2018-05-11 ENCOUNTER — Encounter: Payer: Self-pay | Admitting: Oncology

## 2018-05-11 ENCOUNTER — Other Ambulatory Visit: Payer: Self-pay

## 2018-05-11 ENCOUNTER — Ambulatory Visit
Admission: RE | Admit: 2018-05-11 | Discharge: 2018-05-11 | Disposition: A | Discharge status: Home / Self Care | Payer: BLUE CROSS/BLUE SHIELD | Source: Ambulatory Visit | Attending: Radiation Oncology | Admitting: Radiation Oncology

## 2018-05-11 ENCOUNTER — Inpatient Hospital Stay (HOSPITAL_BASED_OUTPATIENT_CLINIC_OR_DEPARTMENT_OTHER): Payer: BLUE CROSS/BLUE SHIELD | Admitting: Oncology

## 2018-05-11 DIAGNOSIS — G4733 Obstructive sleep apnea (adult) (pediatric): Secondary | ICD-10-CM

## 2018-05-11 DIAGNOSIS — C3491 Malignant neoplasm of unspecified part of right bronchus or lung: Secondary | ICD-10-CM

## 2018-05-11 DIAGNOSIS — I7300 Raynaud's syndrome without gangrene: Secondary | ICD-10-CM

## 2018-05-11 DIAGNOSIS — I10 Essential (primary) hypertension: Secondary | ICD-10-CM | POA: Diagnosis not present

## 2018-05-11 DIAGNOSIS — J449 Chronic obstructive pulmonary disease, unspecified: Secondary | ICD-10-CM | POA: Diagnosis not present

## 2018-05-11 DIAGNOSIS — Z8042 Family history of malignant neoplasm of prostate: Secondary | ICD-10-CM

## 2018-05-11 DIAGNOSIS — Z79899 Other long term (current) drug therapy: Secondary | ICD-10-CM

## 2018-05-11 DIAGNOSIS — F419 Anxiety disorder, unspecified: Secondary | ICD-10-CM

## 2018-05-11 DIAGNOSIS — C342 Malignant neoplasm of middle lobe, bronchus or lung: Secondary | ICD-10-CM | POA: Diagnosis not present

## 2018-05-11 DIAGNOSIS — I2510 Atherosclerotic heart disease of native coronary artery without angina pectoris: Secondary | ICD-10-CM

## 2018-05-11 DIAGNOSIS — F17210 Nicotine dependence, cigarettes, uncomplicated: Secondary | ICD-10-CM

## 2018-05-11 DIAGNOSIS — K219 Gastro-esophageal reflux disease without esophagitis: Secondary | ICD-10-CM

## 2018-05-11 DIAGNOSIS — Z9981 Dependence on supplemental oxygen: Secondary | ICD-10-CM

## 2018-05-11 LAB — CBC WITH DIFFERENTIAL/PLATELET
BASOS ABS: 0.1 10*3/uL (ref 0–0.1)
Basophils Relative: 1 %
Eosinophils Absolute: 0.2 10*3/uL (ref 0–0.7)
Eosinophils Relative: 1 %
HEMATOCRIT: 44.2 % (ref 40.0–52.0)
HEMOGLOBIN: 15.7 g/dL (ref 13.0–18.0)
LYMPHS PCT: 13 %
Lymphs Abs: 1.5 10*3/uL (ref 1.0–3.6)
MCH: 34.7 pg — ABNORMAL HIGH (ref 26.0–34.0)
MCHC: 35.5 g/dL (ref 32.0–36.0)
MCV: 97.8 fL (ref 80.0–100.0)
Monocytes Absolute: 0.8 10*3/uL (ref 0.2–1.0)
Monocytes Relative: 7 %
NEUTROS ABS: 8.6 10*3/uL — AB (ref 1.4–6.5)
Neutrophils Relative %: 78 %
Platelets: 269 10*3/uL (ref 150–440)
RBC: 4.52 MIL/uL (ref 4.40–5.90)
RDW: 12.4 % (ref 11.5–14.5)
WBC: 11.2 10*3/uL — AB (ref 3.8–10.6)

## 2018-05-11 LAB — COMPREHENSIVE METABOLIC PANEL
ALT: 29 U/L (ref 0–44)
ANION GAP: 10 (ref 5–15)
AST: 26 U/L (ref 15–41)
Albumin: 4.2 g/dL (ref 3.5–5.0)
Alkaline Phosphatase: 85 U/L (ref 38–126)
BILIRUBIN TOTAL: 0.6 mg/dL (ref 0.3–1.2)
BUN: 11 mg/dL (ref 6–20)
CHLORIDE: 98 mmol/L (ref 98–111)
CO2: 24 mmol/L (ref 22–32)
Calcium: 9 mg/dL (ref 8.9–10.3)
Creatinine, Ser: 0.72 mg/dL (ref 0.61–1.24)
GFR calc Af Amer: >60 mL/min (ref >60)
Glucose, Bld: 104 mg/dL — ABNORMAL HIGH (ref 70–99)
POTASSIUM: 4.3 mmol/L (ref 3.5–5.1)
Sodium: 132 mmol/L — ABNORMAL LOW (ref 135–145)
TOTAL PROTEIN: 6.8 g/dL (ref 6.5–8.1)

## 2018-05-11 MED ORDER — LORAZEPAM 2 MG/ML IJ SOLN
0.5000 mg | Freq: Once | INTRAMUSCULAR | Status: AC
Start: 2018-05-11 — End: 2018-05-11
  Administered 2018-05-11: 0.5 mg via INTRAVENOUS
  Filled 2018-05-11: qty 1

## 2018-05-11 MED ORDER — DEXAMETHASONE SODIUM PHOSPHATE 10 MG/ML IJ SOLN
10.0000 mg | Freq: Once | INTRAMUSCULAR | Status: AC
  Administered 2018-05-11: 10 mg via INTRAVENOUS
  Filled 2018-05-11: qty 1

## 2018-05-11 MED ORDER — SODIUM CHLORIDE 0.9 % IV SOLN: 10.0000 mg | Freq: Once | INTRAVENOUS | Status: DC

## 2018-05-11 MED ORDER — FAMOTIDINE IN NACL 20-0.9 MG/50ML-% IV SOLN
20.0000 mg | Freq: Once | INTRAVENOUS | Status: AC
  Administered 2018-05-11: 20 mg via INTRAVENOUS
  Filled 2018-05-11: qty 50

## 2018-05-11 MED ORDER — CARBOPLATIN CHEMO INJECTION 450 MG/45ML
299.4000 mg | Freq: Once | INTRAVENOUS | Status: AC
  Administered 2018-05-11: 300 mg via INTRAVENOUS
  Filled 2018-05-11: qty 30

## 2018-05-11 MED ORDER — HEPARIN SOD (PORK) LOCK FLUSH 100 UNIT/ML IV SOLN
500.0000 [IU] | Freq: Once | INTRAVENOUS | Status: AC | PRN
  Administered 2018-05-11: 500 [IU]
  Filled 2018-05-11: qty 5

## 2018-05-11 MED ORDER — SODIUM CHLORIDE 0.9 % IV SOLN
Freq: Once | INTRAVENOUS | Status: AC
  Administered 2018-05-11: 11:00:00 via INTRAVENOUS
  Filled 2018-05-11: qty 1000

## 2018-05-11 MED ORDER — PALONOSETRON HCL INJECTION 0.25 MG/5ML
0.2500 mg | Freq: Once | INTRAVENOUS | Status: AC
  Administered 2018-05-11: 0.25 mg via INTRAVENOUS
  Filled 2018-05-11: qty 5

## 2018-05-11 MED ORDER — DIPHENHYDRAMINE HCL 50 MG/ML IJ SOLN
25.0000 mg | Freq: Once | INTRAMUSCULAR | Status: AC
Start: 2018-05-11 — End: 2018-05-11
  Administered 2018-05-11: 25 mg via INTRAVENOUS
  Filled 2018-05-11: qty 1

## 2018-05-11 NOTE — Progress Notes (Signed)
Patient here for follow up. No concerns voiced.  °

## 2018-05-12 ENCOUNTER — Ambulatory Visit
Admission: RE | Admit: 2018-05-12 | Discharge: 2018-05-12 | Disposition: A | Discharge status: Home / Self Care | Payer: BLUE CROSS/BLUE SHIELD | Source: Ambulatory Visit | Attending: Radiation Oncology | Admitting: Radiation Oncology

## 2018-05-12 DIAGNOSIS — C3491 Malignant neoplasm of unspecified part of right bronchus or lung: Secondary | ICD-10-CM | POA: Diagnosis not present

## 2018-05-14 NOTE — Progress Notes (Signed)
[place]  [phone]) [phone] Fax:(336) [phone]  ID: Paul Carpenter OB: [birth year]  MR#: [id number]  QHU#:[id number]  Patient Care Team: Paul Ruths, MD as PCP - General (Internal Medicine)  CHIEF COMPLAINT: Stage IIIa non-small cell lung cancer, right middle lobe.  INTERVAL HISTORY: Patient returns to clinic today for further evaluation and consideration of cycle 3 of single agent carboplatinum.  He has noted some increased dysphasia and was given a prescription for Carafate by radiation oncology today.  He otherwise is tolerating his treatments well.  He continues to be anxious. He continues to smoke heavily. He has no neurologic complaints. He denies any recent fevers or illnesses. He has no chest pain, cough, hemoptysis, or shortness of breath. He denies any nausea, vomiting, constipation, or diarrhea. He has no urinary complaints.  Patient offers no further specific complaints today.  REVIEW OF SYSTEMS:   Review of Systems  Constitutional: Negative.  Negative for fever, malaise/fatigue and weight loss.  HENT: Positive for sore throat.   Respiratory: Negative.  Negative for cough, hemoptysis and shortness of breath.   Cardiovascular: Negative.  Negative for chest pain and leg swelling.  Gastrointestinal: Negative.  Negative for abdominal pain, blood in stool and melena.  Genitourinary: Negative.  Negative for dysuria.  Musculoskeletal: Negative.  Negative for joint pain.  Skin: Negative.  Negative for rash.  Neurological: Negative.  Negative for sensory change, focal weakness, weakness and headaches.  Psychiatric/Behavioral: The patient is nervous/anxious.     As per HPI. Otherwise, a complete review of systems is negative.  PAST MEDICAL HISTORY: Past Medical History:  Diagnosis Date  . Anginal pain ([place])   . Anxiety   . Chest pain   . Chicken pox   . Complication of anesthesia    o2 dropped after neck fusion  . COPD (chronic obstructive  pulmonary disease) ([place])   . Coronary artery disease   . Cough    chronic  clear phlegm  . Dysrhythmia    palpitations  . GERD (gastroesophageal reflux disease)    h/o reflux/ hoarsness  . Hematochezia   . Hemorrhoids   . History of chickenpox   . History of colon polyps   . History of Helicobacter pylori infection   . Hoarseness   . Hypertension   . Migraines   . OSA (obstructive sleep apnea)    has CPAP but does not use  . Personal history of tobacco use, presenting hazards to health 03/05/2016  . Pneumonia    5/17  . Raynaud disease   . Raynaud disease   . Raynaud's disease   . Rotator cuff tear    on right  . Shortness of breath dyspnea   . Sleep apnea   . Ulcer (traumatic) of oral mucosa     PAST SURGICAL HISTORY: Past Surgical History:  Procedure Laterality Date  . BACK SURGERY     cervical fusion x 2  . CARDIAC CATHETERIZATION    . CERVICAL DISCECTOMY     x 2  . COLONOSCOPY    . COLONOSCOPY N/A 07/25/2015   Procedure: COLONOSCOPY;  Surgeon: Paul Sails, MD;  Location: [facility] ENDOSCOPY;  Service: Endoscopy;  Laterality: N/A;  . COLONOSCOPY WITH PROPOFOL N/A 10/04/2017   Procedure: COLONOSCOPY WITH PROPOFOL;  Surgeon: Paul Sails, MD;  Location: [facility] ENDOSCOPY;  Service: Endoscopy;  Laterality: N/A;  . ELECTROMAGNETIC NAVIGATION BROCHOSCOPY Left 06/28/2016   Procedure: ELECTROMAGNETIC NAVIGATION BRONCHOSCOPY;  Surgeon: Paul Boehringer, MD;  Location: [facility] ORS;  Service:  Cardiopulmonary;  Laterality: Left;  . ENDOBRONCHIAL ULTRASOUND N/A 04/11/2018   Procedure: ENDOBRONCHIAL ULTRASOUND;  Surgeon: Paul Lipps, MD;  Location: [facility] ORS;  Service: Cardiopulmonary;  Laterality: N/A;  . ESOPHAGOGASTRODUODENOSCOPY N/A 07/25/2015   Procedure: ESOPHAGOGASTRODUODENOSCOPY (EGD);  Surgeon: Paul Sails, MD;  Location: [facility] ENDOSCOPY;  Service: Endoscopy;  Laterality: N/A;  . NASAL SINUS SURGERY     x 2   . PORTA CATH INSERTION N/A 04/24/2018   Procedure: PORTA CATH  INSERTION;  Surgeon: Paul Huxley, MD;  Location: [place] CV LAB;  Service: Cardiovascular;  Laterality: N/A;  . rotator cuff surgery Right    07/2016  . SEPTOPLASTY    . SKIN GRAFT      FAMILY HISTORY Family History  Problem Relation Age of Onset  . Heart disease Father   . Prostate cancer Father   . Heart disease Paternal Grandmother   . Heart attack Maternal Grandfather 52  . Kidney cancer Neg Hx   . Bladder Cancer Neg Hx   . Other Neg Hx        pituitary abnormality       ADVANCED DIRECTIVES:    HEALTH MAINTENANCE: Social History   Tobacco Use  . Smoking status: Current Every Day Smoker    Packs/day: 1.50    Years: 45.00    Pack years: 67.50    Types: Cigarettes  . Smokeless tobacco: Never Used  Substance Use Topics  . Alcohol use: Yes    Alcohol/week: 1.2 oz    Types: 2 Standard drinks or equivalent per week    Comment: moderate  . Drug use: No     Allergies  Allergen Reactions  . Lisinopril Rash  . Varenicline Rash    Current Outpatient Medications  Medication Sig Dispense Refill  . acetaminophen (TYLENOL) 500 MG tablet Take 1,000 mg by mouth every 6 (six) hours as needed (for pain.).    Marland Kitchen albuterol (PROAIR HFA) 108 (90 Base) MCG/ACT inhaler Inhale 2 puffs into the lungs every 6 (six) hours as needed for wheezing or shortness of breath. 1 Inhaler 3  . atorvastatin (LIPITOR) 40 MG tablet Take 40 mg by mouth at bedtime.     Marland Kitchen BREO ELLIPTA 200-25 MCG/INH AEPB TAKE 1 PUFF BY MOUTH EVERY DAY 60 each 6  . busPIRone (BUSPAR) 15 MG tablet Take 15 mg by mouth 2 (two) times daily.   10  . diazepam (VALIUM) 5 MG tablet Take 5 mg by mouth every 12 (twelve) hours as needed for anxiety (SCHEDULED EVERY NIGHT).     . DULoxetine (CYMBALTA) 60 MG capsule Take 60 mg by mouth daily.     . fluticasone (FLONASE) 50 MCG/ACT nasal spray Place 1 spray into both nostrils daily as needed (for allergies.).     Marland Kitchen ipratropium-albuterol (DUONEB) 0.5-2.5 (3) MG/3ML SOLN Take 3  mLs by nebulization every 6 (six) hours. (Patient taking differently: Take 3 mLs by nebulization every 6 (six) hours as needed. ) 360 mL 5  . lidocaine-prilocaine (EMLA) cream Apply to affected area once 30 g 3  . metoprolol tartrate (LOPRESSOR) 50 MG tablet Take 50 mg by mouth 2 (two) times daily.    . mirabegron ER (MYRBETRIQ) 50 MG TB24 tablet Take 1 tablet (50 mg total) by mouth daily. 90 tablet 3  . Multiple Vitamin (MULTIVITAMIN WITH MINERALS) TABS tablet Take 1 tablet by mouth at bedtime.    . nicotine (NICODERM CQ - DOSED IN MG/24 HOURS) 21 mg/24hr patch Place 1 patch (21 mg  total) onto the skin daily. 30 patch 12  . nitroGLYCERIN (NITROSTAT) 0.4 MG SL tablet Place 0.4 mg under the tongue every 5 (five) minutes as needed for chest pain.     Marland Kitchen ondansetron (ZOFRAN) 8 MG tablet Take 1 tablet (8 mg total) by mouth 2 (two) times daily as needed for refractory nausea / vomiting. 30 tablet 2  . OXYGEN Inhale 2 L into the lungs at bedtime.     . pantoprazole (PROTONIX) 40 MG tablet Take 40 mg by mouth daily.  3  . potassium chloride (K-DUR,KLOR-CON) 10 MEQ tablet Take 10 mEq by mouth daily.    . prochlorperazine (COMPAZINE) 10 MG tablet Take 1 tablet (10 mg total) by mouth every 6 (six) hours as needed (Nausea or vomiting). 60 tablet 2  . SPIRIVA RESPIMAT 2.5 MCG/ACT AERS INHALE 2 PUFFS INTO THE LUNGS DAILY. 4 g 6  . sucralfate (CARAFATE) 1 g tablet Take 1 tablet (1 g total) by mouth 3 (three) times daily before meals. Dissolve in warm water, swish and swallow 90 tablet 6   No current facility-administered medications for this visit.     OBJECTIVE: Vitals:   05/17/18 0951  BP: 121/78  Pulse: 74  Resp: 18  Temp: (!) 96.3 F (35.7 C)     Body mass index is 27.23 kg/m.    ECOG FS:0 - Asymptomatic  General: Well-developed, well-nourished, no acute distress. Eyes: Pink conjunctiva, anicteric sclera. HEENT: Normocephalic, moist mucous membranes. Lungs: Clear to auscultation  bilaterally. Heart: Regular rate and rhythm. No rubs, murmurs, or gallops. Abdomen: Soft, nontender, nondistended. No organomegaly noted, normoactive bowel sounds. Musculoskeletal: No edema, cyanosis, or clubbing. Neuro: Alert, answering all questions appropriately. Cranial nerves grossly intact. Skin: No rashes or petechiae noted. Psych: Normal affect.  LAB RESULTS:  Lab Results  Component Value Date   NA 132 (L) 05/17/2018   K 4.0 05/17/2018   CL 97 (L) 05/17/2018   CO2 24 05/17/2018   GLUCOSE 127 (H) 05/17/2018   BUN 10 05/17/2018   CREATININE 0.74 05/17/2018   CALCIUM 9.0 05/17/2018   PROT 6.8 05/17/2018   ALBUMIN 4.0 05/17/2018   AST 27 05/17/2018   ALT 25 05/17/2018   ALKPHOS 82 05/17/2018   BILITOT 0.7 05/17/2018   GFRNONAA >60 05/17/2018   GFRAA >60 05/17/2018    Lab Results  Component Value Date   WBC 11.2 (H) 05/17/2018   NEUTROABS 9.4 (H) 05/17/2018   HGB 15.0 05/17/2018   HCT 42.2 05/17/2018   MCV 97.8 05/17/2018   PLT 231 05/17/2018     STUDIES:  ASSESSMENT: Stage IIIa non-small cell lung cancer, right middle lobe.  PLAN:    1. Stage IIIa non-small cell lung cancer, right middle lobe: Case discussed with pathologist and unable to determine whether this is adenocarcinoma or squamous carcinoma.  There is also insufficient tissue to do further testing.  Because of this, I have ordered "liquid biopsy" to assess if there are any actionable mutations.  These results are pending.  PET scan results from Mar 16, 2018 reviewed independently confirming stage of disease.  MRI of the brain completed on Mar 28, 2018 reviewed independently did not reveal metastatic disease.  Continue daily XRT completing on June 26, 2018.  Patient had a reaction to Taxol during cycle 1, therefore this is was discontinued.  Proceed with cycle 3 of weekly single agent carboplatin today.  At the conclusion of his XRT, will proceed with maintenance Imfinzi every 2 weeks for up to 12  months.   Return to clinic in 1 week for further evaluation and consideration of cycle 4. 2.  Secondary polycythemia: Resolved.  Likely secondary to heavy tobacco use.  Previously, the remainder of his laboratory work, including JAK-2 mutation, was either negative or within normal limits.  3. Left upper lobe pulmonary nodule: Imaging results as above.  Previously, biopsy completed on October 04, 2017 was negative for malignancy.  4.  Colon polyps:  Patient has a personal history of greater then 10 adenomatous polyps on his most recent conoloscopy. He does not know of any family history of increased polyps or colon cancer.  Genetic testing to assess for the APC mutation for FAP or AFAP was negative. Continue colonoscopies as per GI. 5. Tobacco Use: Patient continues to heavily smoke, but has decreased significantly.  He expressed understanding by continue to smokeless increases his chance of recurrence. 6. Anxiety: Chronic.  Continue IV Ativan with each treatment.  Continue treatment and evaluation per primary care. 7.  Hyponatremia: Mild, monitor.  Patient expressed understanding and was in agreement with this plan. He also understands that He can call clinic at any time with any questions, concerns, or complaints.    [name], MD   05/21/2018 9:46 AM

## 2018-05-15 ENCOUNTER — Encounter: Payer: Self-pay | Admitting: *Deleted

## 2018-05-15 ENCOUNTER — Ambulatory Visit
Admission: RE | Admit: 2018-05-15 | Discharge: 2018-05-15 | Disposition: A | Discharge status: Home / Self Care | Payer: BLUE CROSS/BLUE SHIELD | Source: Ambulatory Visit | Attending: Radiation Oncology | Admitting: Radiation Oncology

## 2018-05-15 DIAGNOSIS — C3491 Malignant neoplasm of unspecified part of right bronchus or lung: Secondary | ICD-10-CM | POA: Diagnosis not present

## 2018-05-16 ENCOUNTER — Ambulatory Visit
Admission: RE | Admit: 2018-05-16 | Discharge: 2018-05-16 | Disposition: A | Discharge status: Home / Self Care | Payer: BLUE CROSS/BLUE SHIELD | Source: Ambulatory Visit | Attending: Radiation Oncology | Admitting: Radiation Oncology

## 2018-05-16 ENCOUNTER — Encounter: Payer: Self-pay | Admitting: *Deleted

## 2018-05-16 ENCOUNTER — Other Ambulatory Visit: Payer: Self-pay | Admitting: *Deleted

## 2018-05-16 DIAGNOSIS — C3491 Malignant neoplasm of unspecified part of right bronchus or lung: Secondary | ICD-10-CM | POA: Diagnosis not present

## 2018-05-17 ENCOUNTER — Inpatient Hospital Stay (HOSPITAL_BASED_OUTPATIENT_CLINIC_OR_DEPARTMENT_OTHER): Payer: BLUE CROSS/BLUE SHIELD | Admitting: Oncology

## 2018-05-17 ENCOUNTER — Inpatient Hospital Stay: Payer: BLUE CROSS/BLUE SHIELD

## 2018-05-17 ENCOUNTER — Other Ambulatory Visit: Payer: Self-pay | Admitting: *Deleted

## 2018-05-17 ENCOUNTER — Ambulatory Visit
Admission: RE | Admit: 2018-05-17 | Discharge: 2018-05-17 | Disposition: A | Discharge status: Home / Self Care | Payer: BLUE CROSS/BLUE SHIELD | Source: Ambulatory Visit | Attending: Radiation Oncology | Admitting: Radiation Oncology

## 2018-05-17 ENCOUNTER — Encounter: Payer: Self-pay | Admitting: Oncology

## 2018-05-17 VITALS — BP 121/78 | HR 74 | Temp 96.3°F | Resp 18 | Ht 72.0 in | Wt 200.8 lb

## 2018-05-17 DIAGNOSIS — C3491 Malignant neoplasm of unspecified part of right bronchus or lung: Secondary | ICD-10-CM

## 2018-05-17 DIAGNOSIS — G4733 Obstructive sleep apnea (adult) (pediatric): Secondary | ICD-10-CM

## 2018-05-17 DIAGNOSIS — I7300 Raynaud's syndrome without gangrene: Secondary | ICD-10-CM

## 2018-05-17 DIAGNOSIS — I2510 Atherosclerotic heart disease of native coronary artery without angina pectoris: Secondary | ICD-10-CM

## 2018-05-17 DIAGNOSIS — K219 Gastro-esophageal reflux disease without esophagitis: Secondary | ICD-10-CM

## 2018-05-17 DIAGNOSIS — Z8042 Family history of malignant neoplasm of prostate: Secondary | ICD-10-CM

## 2018-05-17 DIAGNOSIS — F419 Anxiety disorder, unspecified: Secondary | ICD-10-CM

## 2018-05-17 DIAGNOSIS — C342 Malignant neoplasm of middle lobe, bronchus or lung: Secondary | ICD-10-CM | POA: Diagnosis not present

## 2018-05-17 DIAGNOSIS — F17210 Nicotine dependence, cigarettes, uncomplicated: Secondary | ICD-10-CM

## 2018-05-17 DIAGNOSIS — Z79899 Other long term (current) drug therapy: Secondary | ICD-10-CM

## 2018-05-17 DIAGNOSIS — Z9981 Dependence on supplemental oxygen: Secondary | ICD-10-CM

## 2018-05-17 DIAGNOSIS — I10 Essential (primary) hypertension: Secondary | ICD-10-CM

## 2018-05-17 DIAGNOSIS — J449 Chronic obstructive pulmonary disease, unspecified: Secondary | ICD-10-CM

## 2018-05-17 DIAGNOSIS — R4702 Dysphasia: Secondary | ICD-10-CM | POA: Diagnosis not present

## 2018-05-17 LAB — COMPREHENSIVE METABOLIC PANEL
ALK PHOS: 82 U/L (ref 38–126)
ALT: 25 U/L (ref 0–44)
AST: 27 U/L (ref 15–41)
Albumin: 4 g/dL (ref 3.5–5.0)
Anion gap: 11 (ref 5–15)
BILIRUBIN TOTAL: 0.7 mg/dL (ref 0.3–1.2)
BUN: 10 mg/dL (ref 6–20)
CALCIUM: 9 mg/dL (ref 8.9–10.3)
CHLORIDE: 97 mmol/L — AB (ref 98–111)
CO2: 24 mmol/L (ref 22–32)
CREATININE: 0.74 mg/dL (ref 0.61–1.24)
GFR calc Af Amer: >60 mL/min (ref >60)
Glucose, Bld: 127 mg/dL — ABNORMAL HIGH (ref 70–99)
Potassium: 4 mmol/L (ref 3.5–5.1)
Sodium: 132 mmol/L — ABNORMAL LOW (ref 135–145)
TOTAL PROTEIN: 6.8 g/dL (ref 6.5–8.1)

## 2018-05-17 LAB — CBC WITH DIFFERENTIAL/PLATELET
BASOS PCT: 1 %
Basophils Absolute: 0.1 10*3/uL (ref 0–0.1)
EOS ABS: 0.1 10*3/uL (ref 0–0.7)
Eosinophils Relative: 1 %
HEMATOCRIT: 42.2 % (ref 40.0–52.0)
HEMOGLOBIN: 15 g/dL (ref 13.0–18.0)
Lymphocytes Relative: 10 %
Lymphs Abs: 1.1 10*3/uL (ref 1.0–3.6)
MCH: 34.9 pg — ABNORMAL HIGH (ref 26.0–34.0)
MCHC: 35.7 g/dL (ref 32.0–36.0)
MCV: 97.8 fL (ref 80.0–100.0)
MONOS PCT: 5 %
Monocytes Absolute: 0.6 10*3/uL (ref 0.2–1.0)
NEUTROS ABS: 9.4 10*3/uL — AB (ref 1.4–6.5)
NEUTROS PCT: 83 %
Platelets: 231 10*3/uL (ref 150–440)
RBC: 4.31 MIL/uL — AB (ref 4.40–5.90)
RDW: 12.8 % (ref 11.5–14.5)
WBC: 11.2 10*3/uL — AB (ref 3.8–10.6)

## 2018-05-17 MED ORDER — SODIUM CHLORIDE 0.9% FLUSH
10.0000 mL | INTRAVENOUS | Status: DC | PRN
  Administered 2018-05-17: 10 mL via INTRAVENOUS
  Filled 2018-05-17: qty 10

## 2018-05-17 MED ORDER — FAMOTIDINE IN NACL 20-0.9 MG/50ML-% IV SOLN
20.0000 mg | Freq: Once | INTRAVENOUS | Status: AC
  Administered 2018-05-17: 20 mg via INTRAVENOUS
  Filled 2018-05-17: qty 50

## 2018-05-17 MED ORDER — HEPARIN SOD (PORK) LOCK FLUSH 100 UNIT/ML IV SOLN
500.0000 [IU] | Freq: Once | INTRAVENOUS | Status: AC
  Administered 2018-05-17: 500 [IU] via INTRAVENOUS
  Filled 2018-05-17: qty 5

## 2018-05-17 MED ORDER — HEPARIN SOD (PORK) LOCK FLUSH 100 UNIT/ML IV SOLN: 500.0000 [IU] | Freq: Once | INTRAVENOUS | Status: DC | PRN

## 2018-05-17 MED ORDER — DEXAMETHASONE SODIUM PHOSPHATE 10 MG/ML IJ SOLN
10.0000 mg | Freq: Once | INTRAMUSCULAR | Status: AC
  Administered 2018-05-17: 10 mg via INTRAVENOUS
  Filled 2018-05-17: qty 1

## 2018-05-17 MED ORDER — SODIUM CHLORIDE 0.9 % IV SOLN
299.4000 mg | Freq: Once | INTRAVENOUS | Status: AC
  Administered 2018-05-17: 300 mg via INTRAVENOUS
  Filled 2018-05-17: qty 30

## 2018-05-17 MED ORDER — LORAZEPAM 2 MG/ML IJ SOLN
0.5000 mg | Freq: Once | INTRAMUSCULAR | Status: AC
  Administered 2018-05-17: 0.5 mg via INTRAVENOUS
  Filled 2018-05-17: qty 1

## 2018-05-17 MED ORDER — SODIUM CHLORIDE 0.9 % IV SOLN: 10.0000 mg | Freq: Once | INTRAVENOUS | Status: DC

## 2018-05-17 MED ORDER — SUCRALFATE 1 G PO TABS: 1.0000 g | ORAL_TABLET | Freq: Three times a day (TID) | ORAL | 6 refills | Status: DC

## 2018-05-17 MED ORDER — DIPHENHYDRAMINE HCL 50 MG/ML IJ SOLN
25.0000 mg | Freq: Once | INTRAMUSCULAR | Status: AC
  Administered 2018-05-17: 25 mg via INTRAVENOUS
  Filled 2018-05-17: qty 1

## 2018-05-17 MED ORDER — SODIUM CHLORIDE 0.9% FLUSH
10.0000 mL | INTRAVENOUS | Status: DC | PRN
  Filled 2018-05-17: qty 10

## 2018-05-17 MED ORDER — PALONOSETRON HCL INJECTION 0.25 MG/5ML
0.2500 mg | Freq: Once | INTRAVENOUS | Status: AC
  Administered 2018-05-17: 0.25 mg via INTRAVENOUS
  Filled 2018-05-17: qty 5

## 2018-05-17 MED ORDER — SODIUM CHLORIDE 0.9 % IV SOLN
Freq: Once | INTRAVENOUS | Status: AC
  Administered 2018-05-17: 11:00:00 via INTRAVENOUS
  Filled 2018-05-17: qty 1000

## 2018-05-17 NOTE — Progress Notes (Signed)
Pt starting to have scratchy throat- was given carafate today sent to pharamacy

## 2018-05-18 ENCOUNTER — Ambulatory Visit
Admission: RE | Admit: 2018-05-18 | Discharge: 2018-05-18 | Disposition: A | Discharge status: Home / Self Care | Payer: BLUE CROSS/BLUE SHIELD | Source: Ambulatory Visit | Attending: Radiation Oncology | Admitting: Radiation Oncology

## 2018-05-18 ENCOUNTER — Encounter: Payer: Self-pay | Admitting: *Deleted

## 2018-05-18 DIAGNOSIS — C3491 Malignant neoplasm of unspecified part of right bronchus or lung: Secondary | ICD-10-CM | POA: Diagnosis not present

## 2018-05-19 ENCOUNTER — Encounter: Payer: Self-pay | Admitting: *Deleted

## 2018-05-19 ENCOUNTER — Ambulatory Visit
Admission: RE | Admit: 2018-05-19 | Discharge: 2018-05-19 | Disposition: A | Discharge status: Home / Self Care | Payer: BLUE CROSS/BLUE SHIELD | Source: Ambulatory Visit | Attending: Radiation Oncology | Admitting: Radiation Oncology

## 2018-05-19 DIAGNOSIS — C3491 Malignant neoplasm of unspecified part of right bronchus or lung: Secondary | ICD-10-CM | POA: Diagnosis not present

## 2018-05-22 ENCOUNTER — Encounter: Payer: Self-pay | Admitting: *Deleted

## 2018-05-22 ENCOUNTER — Ambulatory Visit
Admission: RE | Admit: 2018-05-22 | Discharge: 2018-05-22 | Disposition: A | Discharge status: Home / Self Care | Payer: BLUE CROSS/BLUE SHIELD | Source: Ambulatory Visit | Attending: Radiation Oncology | Admitting: Radiation Oncology

## 2018-05-22 DIAGNOSIS — C3491 Malignant neoplasm of unspecified part of right bronchus or lung: Secondary | ICD-10-CM | POA: Diagnosis not present

## 2018-05-23 ENCOUNTER — Ambulatory Visit
Admission: RE | Admit: 2018-05-23 | Discharge: 2018-05-23 | Disposition: A | Discharge status: Home / Self Care | Payer: BLUE CROSS/BLUE SHIELD | Source: Ambulatory Visit | Attending: Radiation Oncology | Admitting: Radiation Oncology

## 2018-05-23 ENCOUNTER — Encounter: Payer: Self-pay | Admitting: *Deleted

## 2018-05-23 DIAGNOSIS — C3491 Malignant neoplasm of unspecified part of right bronchus or lung: Secondary | ICD-10-CM | POA: Diagnosis not present

## 2018-05-24 ENCOUNTER — Inpatient Hospital Stay: Payer: BLUE CROSS/BLUE SHIELD

## 2018-05-24 ENCOUNTER — Ambulatory Visit
Admission: RE | Admit: 2018-05-24 | Discharge: 2018-05-24 | Disposition: A | Discharge status: Home / Self Care | Payer: BLUE CROSS/BLUE SHIELD | Source: Ambulatory Visit | Attending: Radiation Oncology | Admitting: Radiation Oncology

## 2018-05-24 ENCOUNTER — Other Ambulatory Visit: Payer: Self-pay

## 2018-05-24 ENCOUNTER — Encounter: Payer: Self-pay | Admitting: *Deleted

## 2018-05-24 ENCOUNTER — Inpatient Hospital Stay (HOSPITAL_BASED_OUTPATIENT_CLINIC_OR_DEPARTMENT_OTHER): Payer: BLUE CROSS/BLUE SHIELD | Admitting: Oncology

## 2018-05-24 VITALS — BP 131/79 | HR 75 | Temp 97.3°F | Resp 18 | Wt 205.6 lb

## 2018-05-24 DIAGNOSIS — F17218 Nicotine dependence, cigarettes, with other nicotine-induced disorders: Secondary | ICD-10-CM | POA: Diagnosis not present

## 2018-05-24 DIAGNOSIS — C3491 Malignant neoplasm of unspecified part of right bronchus or lung: Secondary | ICD-10-CM

## 2018-05-24 DIAGNOSIS — C342 Malignant neoplasm of middle lobe, bronchus or lung: Secondary | ICD-10-CM

## 2018-05-24 DIAGNOSIS — C3490 Malignant neoplasm of unspecified part of unspecified bronchus or lung: Secondary | ICD-10-CM

## 2018-05-24 DIAGNOSIS — E871 Hypo-osmolality and hyponatremia: Secondary | ICD-10-CM

## 2018-05-24 DIAGNOSIS — F419 Anxiety disorder, unspecified: Secondary | ICD-10-CM | POA: Diagnosis not present

## 2018-05-24 DIAGNOSIS — R4702 Dysphasia: Secondary | ICD-10-CM | POA: Diagnosis not present

## 2018-05-24 LAB — COMPREHENSIVE METABOLIC PANEL
ALK PHOS: 76 U/L (ref 38–126)
ALT: 29 U/L (ref 0–44)
AST: 23 U/L (ref 15–41)
Albumin: 3.7 g/dL (ref 3.5–5.0)
Anion gap: 9 (ref 5–15)
BUN: 10 mg/dL (ref 6–20)
CALCIUM: 8.8 mg/dL — AB (ref 8.9–10.3)
CO2: 25 mmol/L (ref 22–32)
CREATININE: 0.65 mg/dL (ref 0.61–1.24)
Chloride: 97 mmol/L — ABNORMAL LOW (ref 98–111)
GFR calc Af Amer: >60 mL/min (ref >60)
GFR calc non Af Amer: >60 mL/min (ref >60)
Glucose, Bld: 103 mg/dL — ABNORMAL HIGH (ref 70–99)
Potassium: 4.2 mmol/L (ref 3.5–5.1)
Sodium: 131 mmol/L — ABNORMAL LOW (ref 135–145)
Total Bilirubin: 0.7 mg/dL (ref 0.3–1.2)
Total Protein: 6.6 g/dL (ref 6.5–8.1)

## 2018-05-24 LAB — CBC WITH DIFFERENTIAL/PLATELET
Basophils Absolute: 0.1 10*3/uL (ref 0–0.1)
Basophils Relative: 1 %
EOS ABS: 0.1 10*3/uL (ref 0–0.7)
EOS PCT: 1 %
HCT: 41.8 % (ref 40.0–52.0)
Hemoglobin: 14.9 g/dL (ref 13.0–18.0)
Lymphocytes Relative: 12 %
Lymphs Abs: 1 10*3/uL (ref 1.0–3.6)
MCH: 35 pg — AB (ref 26.0–34.0)
MCHC: 35.7 g/dL (ref 32.0–36.0)
MCV: 98 fL (ref 80.0–100.0)
MONOS PCT: 7 %
Monocytes Absolute: 0.6 10*3/uL (ref 0.2–1.0)
Neutro Abs: 6.2 10*3/uL (ref 1.4–6.5)
Neutrophils Relative %: 79 %
PLATELETS: 236 10*3/uL (ref 150–440)
RBC: 4.27 MIL/uL — ABNORMAL LOW (ref 4.40–5.90)
RDW: 13 % (ref 11.5–14.5)
WBC: 8 10*3/uL (ref 3.8–10.6)

## 2018-05-24 MED ORDER — SODIUM CHLORIDE 0.9 % IV SOLN: 10.0000 mg | Freq: Once | INTRAVENOUS | Status: DC

## 2018-05-24 MED ORDER — DIPHENHYDRAMINE HCL 50 MG/ML IJ SOLN
25.0000 mg | Freq: Once | INTRAMUSCULAR | Status: AC
  Administered 2018-05-24: 25 mg via INTRAVENOUS
  Filled 2018-05-24: qty 1

## 2018-05-24 MED ORDER — DEXAMETHASONE SODIUM PHOSPHATE 10 MG/ML IJ SOLN
10.0000 mg | Freq: Once | INTRAMUSCULAR | Status: AC
  Administered 2018-05-24: 10 mg via INTRAVENOUS
  Filled 2018-05-24: qty 1

## 2018-05-24 MED ORDER — SODIUM CHLORIDE 0.9 % IV SOLN
Freq: Once | INTRAVENOUS | Status: AC
  Administered 2018-05-24: 11:00:00 via INTRAVENOUS
  Filled 2018-05-24: qty 1000

## 2018-05-24 MED ORDER — PALONOSETRON HCL INJECTION 0.25 MG/5ML
0.2500 mg | Freq: Once | INTRAVENOUS | Status: AC
  Administered 2018-05-24: 0.25 mg via INTRAVENOUS
  Filled 2018-05-24: qty 5

## 2018-05-24 MED ORDER — HEPARIN SOD (PORK) LOCK FLUSH 100 UNIT/ML IV SOLN
500.0000 [IU] | Freq: Once | INTRAVENOUS | Status: AC
  Administered 2018-05-24: 500 [IU] via INTRAVENOUS
  Filled 2018-05-24: qty 5

## 2018-05-24 MED ORDER — SODIUM CHLORIDE 0.9% FLUSH
10.0000 mL | Freq: Once | INTRAVENOUS | Status: AC
  Administered 2018-05-24: 10 mL via INTRAVENOUS
  Filled 2018-05-24: qty 10

## 2018-05-24 MED ORDER — LORAZEPAM 2 MG/ML IJ SOLN
0.5000 mg | Freq: Once | INTRAMUSCULAR | Status: AC
  Administered 2018-05-24: 0.5 mg via INTRAVENOUS
  Filled 2018-05-24: qty 1

## 2018-05-24 MED ORDER — SODIUM CHLORIDE 0.9 % IV SOLN
299.4000 mg | Freq: Once | INTRAVENOUS | Status: AC
  Administered 2018-05-24: 300 mg via INTRAVENOUS
  Filled 2018-05-24: qty 30

## 2018-05-24 MED ORDER — FAMOTIDINE IN NACL 20-0.9 MG/50ML-% IV SOLN
20.0000 mg | Freq: Once | INTRAVENOUS | Status: AC
Start: 2018-05-24 — End: 2018-05-24
  Administered 2018-05-24: 20 mg via INTRAVENOUS
  Filled 2018-05-24: qty 50

## 2018-05-24 NOTE — Progress Notes (Signed)
Here for follow up. Per pt feeling " alright " .  feeling drained from radiation per pt.

## 2018-05-24 NOTE — Progress Notes (Signed)
[place]  [phone] [phone]  ID: [name] OB: 1956/08/23  MR#: [id number]  ZDG#:[id number]  Patient Care Team: Kirk Ruths, MD as PCP - General (Internal Medicine)  CHIEF COMPLAINT: Stage IIIa non-small cell lung cancer, right middle lobe.  INTERVAL HISTORY: Patient returns to clinic today for further evaluation and consideration of cycle 4 of single agent carbo.  At his previous visit he had noted some dysphasia and was given a prescription for Carafate.  Today he notes slight improvement in his dysphasia.  He continues to be anxious.  Taking Valium every 12 hours PRN that was prescribed by Dr. Ouida Sills.  Wife notes that he appears "foggy".  She is wondering if he is taking too much Valium.  He also was given a prescription for BuSpar in anticipation to wean off Valium.  She is also worried that he may be dehydrated.  He only drinks tea and no water.  Otherwise he appears to be tolerating treatments well.  Recent fevers or illnesses, chest pain, hemoptysis or vomiting, constipation or diarrhea.Marland Kitchen  REVIEW OF SYSTEMS:   Review of Systems  Constitutional: Positive for malaise/fatigue. Negative for chills, fever and weight loss.  HENT: Negative for congestion and ear pain.   Eyes: Negative.  Negative for blurred vision and double vision.  Respiratory: Negative.  Negative for cough, sputum production and shortness of breath.   Cardiovascular: Negative.  Negative for chest pain, palpitations and leg swelling.  Gastrointestinal: Negative.  Negative for abdominal pain, constipation, diarrhea, nausea and vomiting.  Genitourinary: Negative for dysuria, frequency and urgency.  Musculoskeletal: Negative for back pain and falls.  Skin: Negative.  Negative for rash.  Neurological: Positive for weakness. Negative for dizziness and headaches.  Endo/Heme/Allergies: Negative.  Does not bruise/bleed easily.  Psychiatric/Behavioral: Positive for  depression. The patient is not nervous/anxious and does not have insomnia.        "foggy" "slow to respond"    As per HPI. Otherwise, a complete review of systems is negative.  PAST MEDICAL HISTORY: Past Medical History:  Diagnosis Date  . Anginal pain ([place])   . Anxiety   . Chest pain   . Chicken pox   . Complication of anesthesia    o2 dropped after neck fusion  . COPD (chronic obstructive pulmonary disease) ([place])   . Coronary artery disease   . Cough    chronic  clear phlegm  . Dysrhythmia    palpitations  . GERD (gastroesophageal reflux disease)    h/o reflux/ hoarsness  . Hematochezia   . Hemorrhoids   . History of chickenpox   . History of colon polyps   . History of Helicobacter pylori infection   . Hoarseness   . Hypertension   . Migraines   . OSA (obstructive sleep apnea)    has CPAP but does not use  . Personal history of tobacco use, presenting hazards to health 03/05/2016  . Pneumonia    5/17  . Raynaud disease   . Raynaud disease   . Raynaud's disease   . Rotator cuff tear    on right  . Shortness of breath dyspnea   . Sleep apnea   . Ulcer (traumatic) of oral mucosa     PAST SURGICAL HISTORY: Past Surgical History:  Procedure Laterality Date  . BACK SURGERY     cervical fusion x 2  . CARDIAC CATHETERIZATION    . CERVICAL DISCECTOMY     x 2  . COLONOSCOPY    .  COLONOSCOPY N/A 07/25/2015   Procedure: COLONOSCOPY;  Surgeon: Lollie Sails, MD;  Location: [facility] ENDOSCOPY;  Service: Endoscopy;  Laterality: N/A;  . COLONOSCOPY WITH PROPOFOL N/A 10/04/2017   Procedure: COLONOSCOPY WITH PROPOFOL;  Surgeon: Lollie Sails, MD;  Location: [facility] ENDOSCOPY;  Service: Endoscopy;  Laterality: N/A;  . ELECTROMAGNETIC NAVIGATION BROCHOSCOPY Left 06/28/2016   Procedure: ELECTROMAGNETIC NAVIGATION BRONCHOSCOPY;  Surgeon: Vilinda Boehringer, MD;  Location: [facility] ORS;  Service: Cardiopulmonary;  Laterality: Left;  . ENDOBRONCHIAL ULTRASOUND N/A 04/11/2018   Procedure:  ENDOBRONCHIAL ULTRASOUND;  Surgeon: [name], MD;  Location: [facility] ORS;  Service: Cardiopulmonary;  Laterality: N/A;  . ESOPHAGOGASTRODUODENOSCOPY N/A 07/25/2015   Procedure: ESOPHAGOGASTRODUODENOSCOPY (EGD);  Surgeon: Lollie Sails, MD;  Location: [facility] ENDOSCOPY;  Service: Endoscopy;  Laterality: N/A;  . NASAL SINUS SURGERY     x 2   . PORTA CATH INSERTION N/A 04/24/2018   Procedure: PORTA CATH INSERTION;  Surgeon: Algernon Huxley, MD;  Location: [place];  Service: Cardiovascular;  Laterality: N/A;  . rotator cuff surgery Right    07/2016  . SEPTOPLASTY    . SKIN GRAFT      FAMILY HISTORY Family History  Problem Relation Age of Onset  . Heart disease Father   . Prostate cancer Father   . Heart disease Paternal Grandmother   . Heart attack Maternal Grandfather 52  . Kidney cancer Neg Hx   . Bladder Cancer Neg Hx   . Other Neg Hx        pituitary abnormality       ADVANCED DIRECTIVES:    HEALTH MAINTENANCE: Social History   Tobacco Use  . Smoking status: Current Every Day Smoker    Packs/day: 1.50    Years: 45.00    Pack years: 67.50    Types: Cigarettes  . Smokeless tobacco: Never Used  Substance Use Topics  . Alcohol use: Yes    Alcohol/week: 1.2 oz    Types: 2 Standard drinks or equivalent per week    Comment: moderate  . Drug use: No     Allergies  Allergen Reactions  . Lisinopril Rash  . Varenicline Rash    Current Outpatient Medications  Medication Sig Dispense Refill  . atorvastatin (LIPITOR) 40 MG tablet Take 40 mg by mouth at bedtime.     Marland Kitchen BREO ELLIPTA 200-25 MCG/INH AEPB TAKE 1 PUFF BY MOUTH EVERY DAY 60 each 6  . busPIRone (BUSPAR) 15 MG tablet Take 15 mg by mouth 2 (two) times daily.   10  . diazepam (VALIUM) 5 MG tablet Take 5 mg by mouth every 12 (twelve) hours as needed for anxiety (SCHEDULED EVERY NIGHT).     . DULoxetine (CYMBALTA) 60 MG capsule Take 60 mg by mouth daily.     Marland Kitchen lidocaine-prilocaine (EMLA) cream Apply to  affected area once 30 g 3  . metoprolol tartrate (LOPRESSOR) 50 MG tablet Take 50 mg by mouth 2 (two) times daily.    . mirabegron ER (MYRBETRIQ) 50 MG TB24 tablet Take 1 tablet (50 mg total) by mouth daily. 90 tablet 3  . Multiple Vitamin (MULTIVITAMIN WITH MINERALS) TABS tablet Take 1 tablet by mouth at bedtime.    . OXYGEN Inhale 2 L into the lungs at bedtime.     . pantoprazole (PROTONIX) 40 MG tablet Take 40 mg by mouth daily.  3  . potassium chloride (K-DUR,KLOR-CON) 10 MEQ tablet Take 10 mEq by mouth daily.    Marland Kitchen SPIRIVA RESPIMAT 2.5 MCG/ACT AERS  INHALE 2 PUFFS INTO THE LUNGS DAILY. 4 g 6  . sucralfate (CARAFATE) 1 g tablet Take 1 tablet (1 g total) by mouth 3 (three) times daily before meals. Dissolve in warm water, swish and swallow 90 tablet 6  . acetaminophen (TYLENOL) 500 MG tablet Take 1,000 mg by mouth every 6 (six) hours as needed (for pain.).    Marland Kitchen albuterol (PROAIR HFA) 108 (90 Base) MCG/ACT inhaler Inhale 2 puffs into the lungs every 6 (six) hours as needed for wheezing or shortness of breath. (Patient not taking: Reported on 05/24/2018) 1 Inhaler 3  . fluticasone (FLONASE) 50 MCG/ACT nasal spray Place 1 spray into both nostrils daily as needed (for allergies.).     Marland Kitchen ipratropium-albuterol (DUONEB) 0.5-2.5 (3) MG/3ML SOLN Take 3 mLs by nebulization every 6 (six) hours. (Patient not taking: Reported on 05/24/2018) 360 mL 5  . nicotine (NICODERM CQ - DOSED IN MG/24 HOURS) 21 mg/24hr patch Place 1 patch (21 mg total) onto the skin daily. (Patient not taking: Reported on 05/24/2018) 30 patch 12  . nitroGLYCERIN (NITROSTAT) 0.4 MG SL tablet Place 0.4 mg under the tongue every 5 (five) minutes as needed for chest pain.     Marland Kitchen ondansetron (ZOFRAN) 8 MG tablet Take 1 tablet (8 mg total) by mouth 2 (two) times daily as needed for refractory nausea / vomiting. (Patient not taking: Reported on 05/24/2018) 30 tablet 2  . prochlorperazine (COMPAZINE) 10 MG tablet Take 1 tablet (10 mg total) by mouth  every 6 (six) hours as needed (Nausea or vomiting). (Patient not taking: Reported on 05/24/2018) 60 tablet 2   No current facility-administered medications for this visit.    Facility-Administered Medications Ordered in Other Visits  Medication Dose Route Frequency Provider Last Rate Last Dose  . heparin lock flush 100 unit/mL  500 Units Intravenous Once [name], MD        OBJECTIVE: Vitals:   05/24/18 0932  BP: 131/79  Pulse: 75  Resp: 18  Temp: (!) 97.3 F (36.3 C)     Body mass index is 27.88 kg/m.    ECOG FS:0 - Asymptomatic  Physical Exam  Constitutional: He is oriented to person, place, and time. Vital signs are normal. He appears well-developed and well-nourished.  HENT:  Head: Normocephalic and atraumatic.  Eyes: Pupils are equal, round, and reactive to light.  Neck: Normal range of motion.  Cardiovascular: Normal rate, regular rhythm and normal heart sounds.  No murmur heard. Pulmonary/Chest: Effort normal and breath sounds normal. He has no wheezes.  Abdominal: Soft. Normal appearance and bowel sounds are normal. He exhibits no distension. There is no tenderness.  Musculoskeletal: Normal range of motion. He exhibits no edema.  Neurological: He is alert and oriented to person, place, and time.  Skin: Skin is warm and dry. No rash noted.  Psychiatric: Judgment normal.    LAB RESULTS:  Lab Results  Component Value Date   NA 131 (L) 05/24/2018   K 4.2 05/24/2018   CL 97 (L) 05/24/2018   CO2 25 05/24/2018   GLUCOSE 103 (H) 05/24/2018   BUN 10 05/24/2018   CREATININE 0.65 05/24/2018   CALCIUM 8.8 (L) 05/24/2018   PROT 6.6 05/24/2018   ALBUMIN 3.7 05/24/2018   AST 23 05/24/2018   ALT 29 05/24/2018   ALKPHOS 76 05/24/2018   BILITOT 0.7 05/24/2018   GFRNONAA >60 05/24/2018   GFRAA >60 05/24/2018    Lab Results  Component Value Date   WBC 8.0 05/24/2018  NEUTROABS 6.2 05/24/2018   HGB 14.9 05/24/2018   HCT 41.8 05/24/2018   MCV 98.0  05/24/2018   PLT 236 05/24/2018     STUDIES:  ASSESSMENT: Stage IIIa non-small cell lung cancer, right middle lobe.  PLAN:    1. Stage IIIa non-small cell lung cancer, right middle lobe: Case discussed with pathologist previuosly and unable to determine whether this is adenocarcinoma or squamous carcinoma.  Additionally had insufficient tissue for further testing.  A liquid biopsy was ordered revealing no actionable mutations.  PET scan from May 2019 confirmed stage of disease.  MRI of brain in May 2019 did not reveal metastatic disease.  Currently undergoing XRT which he will completed 06/26/18.  Had reaction to Taxol during cycle 1 therefore it was discontinued.  Proceed today with cycle 4 of single agent carbo.  At conclusion of XRT, will proceed with maintenance Imfinzi every 2 weeks for 12 months. RTC in 1 week for further evaluation and consideration of cycle 5. 2.  Secondary polycythemia: Resolved.  Likely secondary to his smoking.  Laboratory work including Olivarez 2 mutation was negative.  3.  Tobacco use: Patient trying to cut back. Still smoking heavily. 4.  Anxiety: Chronic.  Recently started on BuSpar by Dr. Ouida Sills.  Additionally continued his every 12 hours Valium.  Wife noted some "fogginess and forgetfulness". He appeared to be sleeping more.  I recommend he take 1 Valium daily, at bedtime and continue the BuSpar as prescribed.  Patient and wife in agreement with plan. 5. Dysphasia: Continue Carafate. Have added RX Magic mouthwash to help with sore throat.  6. Hyponatremia: Sodium is 131 today.  Patient will  received 1 L NaCl with treatment today.  Encouraged him to stay hydrated.  Encouraged him to drink more water and less tea.   Greater than 50% was spent in counseling and coordination of care with this patient including but not limited to discussion of the relevant topics above (See A&P) including, but not limited to diagnosis and management of acute and chronic medical  conditions.   Patient expressed understanding and was in agreement with this plan. He also understands that He can call clinic at any time with any questions, concerns, or complaints.    Jacquelin Hawking, NP   05/24/2018 9:46 AM

## 2018-05-25 ENCOUNTER — Ambulatory Visit
Admission: RE | Admit: 2018-05-25 | Discharge: 2018-05-25 | Disposition: A | Discharge status: Home / Self Care | Payer: BLUE CROSS/BLUE SHIELD | Source: Ambulatory Visit | Attending: Radiation Oncology | Admitting: Radiation Oncology

## 2018-05-25 ENCOUNTER — Encounter: Payer: Self-pay | Admitting: *Deleted

## 2018-05-25 DIAGNOSIS — C3491 Malignant neoplasm of unspecified part of right bronchus or lung: Secondary | ICD-10-CM | POA: Diagnosis not present

## 2018-05-26 ENCOUNTER — Ambulatory Visit
Admission: RE | Admit: 2018-05-26 | Discharge: 2018-05-26 | Disposition: A | Discharge status: Home / Self Care | Payer: BLUE CROSS/BLUE SHIELD | Source: Ambulatory Visit | Attending: Radiation Oncology | Admitting: Radiation Oncology

## 2018-05-26 ENCOUNTER — Encounter: Payer: Self-pay | Admitting: *Deleted

## 2018-05-26 DIAGNOSIS — C3491 Malignant neoplasm of unspecified part of right bronchus or lung: Secondary | ICD-10-CM | POA: Diagnosis not present

## 2018-05-29 ENCOUNTER — Ambulatory Visit
Admission: RE | Admit: 2018-05-29 | Discharge: 2018-05-29 | Disposition: A | Discharge status: Home / Self Care | Payer: BLUE CROSS/BLUE SHIELD | Source: Ambulatory Visit | Attending: Radiation Oncology | Admitting: Radiation Oncology

## 2018-05-29 ENCOUNTER — Encounter: Payer: Self-pay | Admitting: *Deleted

## 2018-05-29 ENCOUNTER — Telehealth: Payer: Self-pay | Admitting: *Deleted

## 2018-05-29 DIAGNOSIS — C3491 Malignant neoplasm of unspecified part of right bronchus or lung: Secondary | ICD-10-CM | POA: Diagnosis not present

## 2018-05-29 NOTE — Telephone Encounter (Signed)
Pt's wife called in requesting prescription for "GI cocktail" to help with patient's swallowing. States radiation has caused pain when swallowing. Has been using carafate as prescribed. Per Sonia Baller, may fax in prescription for medicated mouthwash. Prescription faxed to [facility] in [place] per pt request. Pt made aware.

## 2018-05-30 ENCOUNTER — Ambulatory Visit
Admission: RE | Admit: 2018-05-30 | Discharge: 2018-05-30 | Disposition: A | Discharge status: Home / Self Care | Payer: BLUE CROSS/BLUE SHIELD | Source: Ambulatory Visit | Attending: Radiation Oncology | Admitting: Radiation Oncology

## 2018-05-30 ENCOUNTER — Encounter: Payer: Self-pay | Admitting: *Deleted

## 2018-05-30 DIAGNOSIS — C3491 Malignant neoplasm of unspecified part of right bronchus or lung: Secondary | ICD-10-CM | POA: Diagnosis not present

## 2018-05-31 ENCOUNTER — Inpatient Hospital Stay (HOSPITAL_BASED_OUTPATIENT_CLINIC_OR_DEPARTMENT_OTHER): Payer: BLUE CROSS/BLUE SHIELD | Admitting: Oncology

## 2018-05-31 ENCOUNTER — Encounter: Payer: Self-pay | Admitting: Oncology

## 2018-05-31 ENCOUNTER — Ambulatory Visit
Admission: RE | Admit: 2018-05-31 | Discharge: 2018-05-31 | Disposition: A | Discharge status: Home / Self Care | Payer: BLUE CROSS/BLUE SHIELD | Source: Ambulatory Visit | Attending: Radiation Oncology | Admitting: Radiation Oncology

## 2018-05-31 ENCOUNTER — Inpatient Hospital Stay: Payer: BLUE CROSS/BLUE SHIELD

## 2018-05-31 ENCOUNTER — Encounter: Payer: Self-pay | Admitting: *Deleted

## 2018-05-31 VITALS — BP 140/88 | HR 76 | Temp 96.6°F | Resp 16 | Wt 202.0 lb

## 2018-05-31 DIAGNOSIS — Z9981 Dependence on supplemental oxygen: Secondary | ICD-10-CM

## 2018-05-31 DIAGNOSIS — Z79899 Other long term (current) drug therapy: Secondary | ICD-10-CM

## 2018-05-31 DIAGNOSIS — G4733 Obstructive sleep apnea (adult) (pediatric): Secondary | ICD-10-CM

## 2018-05-31 DIAGNOSIS — I7300 Raynaud's syndrome without gangrene: Secondary | ICD-10-CM

## 2018-05-31 DIAGNOSIS — R531 Weakness: Secondary | ICD-10-CM | POA: Diagnosis not present

## 2018-05-31 DIAGNOSIS — J449 Chronic obstructive pulmonary disease, unspecified: Secondary | ICD-10-CM

## 2018-05-31 DIAGNOSIS — C3432 Malignant neoplasm of lower lobe, left bronchus or lung: Secondary | ICD-10-CM | POA: Diagnosis not present

## 2018-05-31 DIAGNOSIS — E871 Hypo-osmolality and hyponatremia: Secondary | ICD-10-CM

## 2018-05-31 DIAGNOSIS — Z86010 Personal history of colonic polyps: Secondary | ICD-10-CM

## 2018-05-31 DIAGNOSIS — R5383 Other fatigue: Secondary | ICD-10-CM

## 2018-05-31 DIAGNOSIS — F17210 Nicotine dependence, cigarettes, uncomplicated: Secondary | ICD-10-CM

## 2018-05-31 DIAGNOSIS — R5381 Other malaise: Secondary | ICD-10-CM

## 2018-05-31 DIAGNOSIS — C3491 Malignant neoplasm of unspecified part of right bronchus or lung: Secondary | ICD-10-CM

## 2018-05-31 DIAGNOSIS — C342 Malignant neoplasm of middle lobe, bronchus or lung: Secondary | ICD-10-CM | POA: Diagnosis not present

## 2018-05-31 DIAGNOSIS — F419 Anxiety disorder, unspecified: Secondary | ICD-10-CM

## 2018-05-31 DIAGNOSIS — Z8042 Family history of malignant neoplasm of prostate: Secondary | ICD-10-CM

## 2018-05-31 DIAGNOSIS — R4702 Dysphasia: Secondary | ICD-10-CM | POA: Diagnosis not present

## 2018-05-31 DIAGNOSIS — I2510 Atherosclerotic heart disease of native coronary artery without angina pectoris: Secondary | ICD-10-CM

## 2018-05-31 DIAGNOSIS — C3490 Malignant neoplasm of unspecified part of unspecified bronchus or lung: Secondary | ICD-10-CM

## 2018-05-31 DIAGNOSIS — K219 Gastro-esophageal reflux disease without esophagitis: Secondary | ICD-10-CM

## 2018-05-31 DIAGNOSIS — I10 Essential (primary) hypertension: Secondary | ICD-10-CM

## 2018-05-31 LAB — COMPREHENSIVE METABOLIC PANEL
ALBUMIN: 4.1 g/dL (ref 3.5–5.0)
ALT: 25 U/L (ref 0–44)
ANION GAP: 10 (ref 5–15)
AST: 21 U/L (ref 15–41)
Alkaline Phosphatase: 79 U/L (ref 38–126)
BUN: 14 mg/dL (ref 6–20)
CHLORIDE: 98 mmol/L (ref 98–111)
CO2: 24 mmol/L (ref 22–32)
CREATININE: 0.7 mg/dL (ref 0.61–1.24)
Calcium: 9.1 mg/dL (ref 8.9–10.3)
GFR calc non Af Amer: >60 mL/min (ref >60)
GLUCOSE: 93 mg/dL (ref 70–99)
Potassium: 4.1 mmol/L (ref 3.5–5.1)
Sodium: 132 mmol/L — ABNORMAL LOW (ref 135–145)
Total Bilirubin: 0.6 mg/dL (ref 0.3–1.2)
Total Protein: 7.1 g/dL (ref 6.5–8.1)

## 2018-05-31 LAB — CBC WITH DIFFERENTIAL/PLATELET
Basophils Absolute: 0 10*3/uL (ref 0–0.1)
Basophils Relative: 1 %
EOS ABS: 0 10*3/uL (ref 0–0.7)
Eosinophils Relative: 0 %
HCT: 41.5 % (ref 40.0–52.0)
Hemoglobin: 14.7 g/dL (ref 13.0–18.0)
Lymphocytes Relative: 13 %
Lymphs Abs: 0.9 10*3/uL — ABNORMAL LOW (ref 1.0–3.6)
MCH: 35.2 pg — ABNORMAL HIGH (ref 26.0–34.0)
MCHC: 35.4 g/dL (ref 32.0–36.0)
MCV: 99.5 fL (ref 80.0–100.0)
MONO ABS: 0.5 10*3/uL (ref 0.2–1.0)
Monocytes Relative: 8 %
Neutro Abs: 5.4 10*3/uL (ref 1.4–6.5)
Neutrophils Relative %: 78 %
Platelets: 142 10*3/uL — ABNORMAL LOW (ref 150–440)
RBC: 4.18 MIL/uL — ABNORMAL LOW (ref 4.40–5.90)
RDW: 13.4 % (ref 11.5–14.5)
WBC: 6.9 10*3/uL (ref 3.8–10.6)

## 2018-05-31 MED ORDER — HEPARIN SOD (PORK) LOCK FLUSH 100 UNIT/ML IV SOLN
500.0000 [IU] | Freq: Once | INTRAVENOUS | Status: AC
  Administered 2018-05-31: 500 [IU] via INTRAVENOUS
  Filled 2018-05-31: qty 5

## 2018-05-31 MED ORDER — SODIUM CHLORIDE 0.9 % IV SOLN
Freq: Once | INTRAVENOUS | Status: AC
Start: 2018-05-31 — End: 2018-05-31
  Administered 2018-05-31: 12:00:00 via INTRAVENOUS
  Filled 2018-05-31: qty 1000

## 2018-05-31 MED ORDER — SODIUM CHLORIDE 0.9 % IV SOLN
Freq: Once | INTRAVENOUS | Status: AC
  Administered 2018-05-31: 12:00:00 via INTRAVENOUS
  Filled 2018-05-31: qty 1000

## 2018-05-31 MED ORDER — DIPHENHYDRAMINE HCL 50 MG/ML IJ SOLN
25.0000 mg | Freq: Once | INTRAMUSCULAR | Status: AC
  Administered 2018-05-31: 25 mg via INTRAVENOUS
  Filled 2018-05-31: qty 1

## 2018-05-31 MED ORDER — FAMOTIDINE IN NACL 20-0.9 MG/50ML-% IV SOLN
20.0000 mg | Freq: Once | INTRAVENOUS | Status: AC
  Administered 2018-05-31: 20 mg via INTRAVENOUS
  Filled 2018-05-31: qty 50

## 2018-05-31 MED ORDER — PALONOSETRON HCL INJECTION 0.25 MG/5ML
0.2500 mg | Freq: Once | INTRAVENOUS | Status: AC
  Administered 2018-05-31: 0.25 mg via INTRAVENOUS
  Filled 2018-05-31: qty 5

## 2018-05-31 MED ORDER — DEXAMETHASONE SODIUM PHOSPHATE 10 MG/ML IJ SOLN
10.0000 mg | Freq: Once | INTRAMUSCULAR | Status: AC
  Administered 2018-05-31: 10 mg via INTRAVENOUS
  Filled 2018-05-31: qty 1

## 2018-05-31 MED ORDER — CARBOPLATIN CHEMO INJECTION 450 MG/45ML
299.4000 mg | Freq: Once | INTRAVENOUS | Status: AC
  Administered 2018-05-31: 300 mg via INTRAVENOUS
  Filled 2018-05-31: qty 30

## 2018-05-31 MED ORDER — SODIUM CHLORIDE 0.9 % IV SOLN: 10.0000 mg | Freq: Once | INTRAVENOUS | Status: DC

## 2018-05-31 MED ORDER — LORAZEPAM 2 MG/ML IJ SOLN
0.5000 mg | Freq: Once | INTRAMUSCULAR | Status: AC
  Administered 2018-05-31: 0.5 mg via INTRAVENOUS
  Filled 2018-05-31: qty 1

## 2018-05-31 NOTE — Progress Notes (Signed)
[place]  Telephone:(336) [phone] Fax:(336) [phone]  ID: Paul Carpenter OB: [birth year]  MR#: [id number]  BZJ#:[id number]  Patient Care Team: Kirk Ruths, MD as PCP - General (Internal Medicine)  CHIEF COMPLAINT: Stage IIIa non-small cell lung cancer, right middle lobe.  INTERVAL HISTORY: Patient returns to clinic for further evaluation and consideration of cycle 5 single agent carbo.  He continues to complain of fatigue but is feeling better.  He noted his dysphasia was better with the addition of Magic mouthwash.  He continues Carafate as prescribed.  He is anxious.  He continues to smoke.  He has cut back his volium from 2 times a day to once a day.  Wife has noted significant increase in his mental clarity.  He continues to try to push fluids including Gatorade.  He denies any neurological complaints, recent fevers or illnesses, chest pain, cough, hemoptysis or shortness of breath.  He denies any nausea, vomiting, constipation or diarrhea.  He has no urinary complaints.  REVIEW OF SYSTEMS:   Review of Systems  Constitutional: Positive for malaise/fatigue. Negative for chills, fever and weight loss.  HENT: Negative for congestion and ear pain.   Eyes: Negative.  Negative for blurred vision and double vision.  Respiratory: Positive for shortness of breath (Intermittent). Negative for cough and sputum production.   Cardiovascular: Negative.  Negative for chest pain, palpitations and leg swelling.  Gastrointestinal: Negative.  Negative for abdominal pain, constipation, diarrhea, nausea and vomiting.  Genitourinary: Negative for dysuria, frequency and urgency.  Musculoskeletal: Negative for back pain and falls.  Skin: Negative.  Negative for rash.  Neurological: Positive for weakness. Negative for headaches.  Endo/Heme/Allergies: Negative.  Does not bruise/bleed easily.  Psychiatric/Behavioral: Negative for depression. The patient is nervous/anxious. The  patient does not have insomnia.     As per HPI. Otherwise, a complete review of systems is negative.  PAST MEDICAL HISTORY: Past Medical History:  Diagnosis Date  . Anginal pain ([place])   . Anxiety   . Chest pain   . Chicken pox   . Complication of anesthesia    o2 dropped after neck fusion  . COPD (chronic obstructive pulmonary disease) ([place])   . Coronary artery disease   . Cough    chronic  clear phlegm  . Dysrhythmia    palpitations  . GERD (gastroesophageal reflux disease)    h/o reflux/ hoarsness  . Hematochezia   . Hemorrhoids   . History of chickenpox   . History of colon polyps   . History of Helicobacter pylori infection   . Hoarseness   . Hypertension   . Migraines   . OSA (obstructive sleep apnea)    has CPAP but does not use  . Personal history of tobacco use, presenting hazards to health 03/05/2016  . Pneumonia    5/17  . Raynaud disease   . Raynaud disease   . Raynaud's disease   . Rotator cuff tear    on right  . Shortness of breath dyspnea   . Sleep apnea   . Ulcer (traumatic) of oral mucosa     PAST SURGICAL HISTORY: Past Surgical History:  Procedure Laterality Date  . BACK SURGERY     cervical fusion x 2  . CARDIAC CATHETERIZATION    . CERVICAL DISCECTOMY     x 2  . COLONOSCOPY    . COLONOSCOPY N/A 07/25/2015   Procedure: COLONOSCOPY;  Surgeon: Lollie Sails, MD;  Location: [facility] ENDOSCOPY;  Service: Endoscopy;  Laterality: N/A;  . COLONOSCOPY WITH PROPOFOL N/A 10/04/2017   Procedure: COLONOSCOPY WITH PROPOFOL;  Surgeon: Lollie Sails, MD;  Location: [facility] ENDOSCOPY;  Service: Endoscopy;  Laterality: N/A;  . ELECTROMAGNETIC NAVIGATION BROCHOSCOPY Left 06/28/2016   Procedure: ELECTROMAGNETIC NAVIGATION BRONCHOSCOPY;  Surgeon: Vilinda Boehringer, MD;  Location: [facility] ORS;  Service: Cardiopulmonary;  Laterality: Left;  . ENDOBRONCHIAL ULTRASOUND N/A 04/11/2018   Procedure: ENDOBRONCHIAL ULTRASOUND;  Surgeon: [name], MD;  Location: [facility] ORS;   Service: Cardiopulmonary;  Laterality: N/A;  . ESOPHAGOGASTRODUODENOSCOPY N/A 07/25/2015   Procedure: ESOPHAGOGASTRODUODENOSCOPY (EGD);  Surgeon: Lollie Sails, MD;  Location: [facility] ENDOSCOPY;  Service: Endoscopy;  Laterality: N/A;  . NASAL SINUS SURGERY     x 2   . PORTA CATH INSERTION N/A 04/24/2018   Procedure: PORTA CATH INSERTION;  Surgeon: Algernon Huxley, MD;  Location: [place] CV LAB;  Service: Cardiovascular;  Laterality: N/A;  . rotator cuff surgery Right    07/2016  . SEPTOPLASTY    . SKIN GRAFT      FAMILY HISTORY Family History  Problem Relation Age of Onset  . Heart disease Father   . Prostate cancer Father   . Heart disease Paternal Grandmother   . Heart attack Maternal Grandfather 52  . Kidney cancer Neg Hx   . Bladder Cancer Neg Hx   . Other Neg Hx        pituitary abnormality       ADVANCED DIRECTIVES:    HEALTH MAINTENANCE: Social History   Tobacco Use  . Smoking status: Current Every Day Smoker    Packs/day: 1.50    Years: 45.00    Pack years: 67.50    Types: Cigarettes  . Smokeless tobacco: Never Used  Substance Use Topics  . Alcohol use: Yes    Alcohol/week: 1.2 oz    Types: 2 Standard drinks or equivalent per week    Comment: moderate  . Drug use: No     Allergies  Allergen Reactions  . Lisinopril Rash  . Varenicline Rash    Current Outpatient Medications  Medication Sig Dispense Refill  . acetaminophen (TYLENOL) 500 MG tablet Take 1,000 mg by mouth every 6 (six) hours as needed (for pain.).    Marland Kitchen albuterol (PROAIR HFA) 108 (90 Base) MCG/ACT inhaler Inhale 2 puffs into the lungs every 6 (six) hours as needed for wheezing or shortness of breath. (Patient not taking: Reported on 05/24/2018) 1 Inhaler 3  . atorvastatin (LIPITOR) 40 MG tablet Take 40 mg by mouth at bedtime.     Marland Kitchen BREO ELLIPTA 200-25 MCG/INH AEPB TAKE 1 PUFF BY MOUTH EVERY DAY 60 each 6  . busPIRone (BUSPAR) 15 MG tablet Take 15 mg by mouth 2 (two) times daily.   10  .  diazepam (VALIUM) 5 MG tablet Take 5 mg by mouth every 12 (twelve) hours as needed for anxiety (SCHEDULED EVERY NIGHT).     . DULoxetine (CYMBALTA) 60 MG capsule Take 60 mg by mouth daily.     . fluticasone (FLONASE) 50 MCG/ACT nasal spray Place 1 spray into both nostrils daily as needed (for allergies.).     Marland Kitchen ipratropium-albuterol (DUONEB) 0.5-2.5 (3) MG/3ML SOLN Take 3 mLs by nebulization every 6 (six) hours. (Patient not taking: Reported on 05/24/2018) 360 mL 5  . lidocaine-prilocaine (EMLA) cream Apply to affected area once 30 g 3  . metoprolol tartrate (LOPRESSOR) 50 MG tablet Take 50 mg by mouth 2 (two) times daily.    . mirabegron ER (MYRBETRIQ)  50 MG TB24 tablet Take 1 tablet (50 mg total) by mouth daily. 90 tablet 3  . Multiple Vitamin (MULTIVITAMIN WITH MINERALS) TABS tablet Take 1 tablet by mouth at bedtime.    . nicotine (NICODERM CQ - DOSED IN MG/24 HOURS) 21 mg/24hr patch Place 1 patch (21 mg total) onto the skin daily. (Patient not taking: Reported on 05/24/2018) 30 patch 12  . nitroGLYCERIN (NITROSTAT) 0.4 MG SL tablet Place 0.4 mg under the tongue every 5 (five) minutes as needed for chest pain.     Marland Kitchen ondansetron (ZOFRAN) 8 MG tablet Take 1 tablet (8 mg total) by mouth 2 (two) times daily as needed for refractory nausea / vomiting. (Patient not taking: Reported on 05/24/2018) 30 tablet 2  . OXYGEN Inhale 2 L into the lungs at bedtime.     . pantoprazole (PROTONIX) 40 MG tablet Take 40 mg by mouth daily.  3  . potassium chloride (K-DUR,KLOR-CON) 10 MEQ tablet Take 10 mEq by mouth daily.    . prochlorperazine (COMPAZINE) 10 MG tablet Take 1 tablet (10 mg total) by mouth every 6 (six) hours as needed (Nausea or vomiting). (Patient not taking: Reported on 05/24/2018) 60 tablet 2  . SPIRIVA RESPIMAT 2.5 MCG/ACT AERS INHALE 2 PUFFS INTO THE LUNGS DAILY. 4 g 6  . sucralfate (CARAFATE) 1 g tablet Take 1 tablet (1 g total) by mouth 3 (three) times daily before meals. Dissolve in warm water,  swish and swallow 90 tablet 6   No current facility-administered medications for this visit.     OBJECTIVE: There were no vitals filed for this visit.   There is no height or weight on file to calculate BMI.    ECOG FS:0 - Asymptomatic  Physical Exam  Constitutional: He is oriented to person, place, and time. Vital signs are normal. He appears well-developed and well-nourished.  HENT:  Head: Normocephalic and atraumatic.  Eyes: Pupils are equal, round, and reactive to light.  Neck: Normal range of motion.  Cardiovascular: Normal rate, regular rhythm and normal heart sounds.  No murmur heard. Pulmonary/Chest: Effort normal and breath sounds normal. He has no wheezes.  Abdominal: Soft. Normal appearance and bowel sounds are normal. He exhibits no distension. There is no tenderness.  Musculoskeletal: Normal range of motion. He exhibits no edema.  Neurological: He is alert and oriented to person, place, and time.  Skin: Skin is warm and dry. No rash noted.  Psychiatric: Judgment normal.    LAB RESULTS:  Lab Results  Component Value Date   NA 132 (L) 05/31/2018   K 4.1 05/31/2018   CL 98 05/31/2018   CO2 24 05/31/2018   GLUCOSE 93 05/31/2018   BUN 14 05/31/2018   CREATININE 0.70 05/31/2018   CALCIUM 9.1 05/31/2018   PROT 7.1 05/31/2018   ALBUMIN 4.1 05/31/2018   AST 21 05/31/2018   ALT 25 05/31/2018   ALKPHOS 79 05/31/2018   BILITOT 0.6 05/31/2018   GFRNONAA >60 05/31/2018   GFRAA >60 05/31/2018    Lab Results  Component Value Date   WBC 6.9 05/31/2018   NEUTROABS 5.4 05/31/2018   HGB 14.7 05/31/2018   HCT 41.5 05/31/2018   MCV 99.5 05/31/2018   PLT 142 (L) 05/31/2018     STUDIES:  ASSESSMENT: Stage IIIa non-small cell lung cancer, right middle lobe.  PLAN:    1. Stage IIIa non-small cell lung cancer, right middle lobe: Case discussed with pathologist and unable to determine whether this is adenocarcinoma or squamous carcinoma.  There is also insufficient  tissue to do further testing.  Liquid biopsy did not reveal actionable mutations.  PET scan from May 2019 reviewed confirming stage of disease.  MRI of brain completed in May 2019 did not reveal metastatic disease.  Will complete XRT in August 2019.  Had a reaction to Taxol during cycle 1 therefore this was discontinued.  Proceed today with cycle 5 of single agent carbo.  At the conclusion of XRT he will proceed with maintenance Imfinzi every 2 weeks return to clinic in 1 week for further evaluation and consideration of cycle 6. 2.  Secondary polycythemia: Resolved.  Likely secondary to heavy tobacco use.  Previously, the remainder of his laboratory work, including JAK-2 mutation, was either negative or within normal limits.  3. Left upper lobe pulmonary nodule: Imaging results as above.  Previously, biopsy completed on October 04, 2017 was negative for malignancy.  4.  Colon polyps:  Patient has a personal history of greater then 10 adenomatous polyps on his most recent conoloscopy. He does not know of any family history of increased polyps or colon cancer.  Genetic testing to assess for the APC mutation for FAP or AFAP was negative. Continue colonoscopies as per GI. 5. Tobacco Use: Patient continues to heavily smoke, but has decreased significantly.  He expressed understanding by continue to smokeless increases his chance of recurrence. 6. Anxiety: Chronic.  Continue Valium as needed.  Have asked him to reduce his Valium from twice a day to once a day.  He seems to be helping with his mental clarity.  7.  Hyponatremia:  mild.  Sodium 132 today.  He will receive 500 mL's NaCl.  Continue with hydration.   8.  Fatigue/weakness: Continue activities.  Likely due to concurrent chemo radiation.   Greater than 50% was spent in counseling and coordination of care with this patient including but not limited to discussion of the relevant topics above (See A&P) including, but not limited to diagnosis and management  of acute and chronic medical conditions.    Patient expressed understanding and was in agreement with this plan. He also understands that He can call clinic at any time with any questions, concerns, or complaints.    Jacquelin Hawking, NP   05/31/2018 10:41 AM

## 2018-06-01 ENCOUNTER — Encounter: Payer: Self-pay | Admitting: *Deleted

## 2018-06-01 ENCOUNTER — Ambulatory Visit
Admission: RE | Admit: 2018-06-01 | Discharge: 2018-06-01 | Disposition: A | Discharge status: Home / Self Care | Payer: BLUE CROSS/BLUE SHIELD | Source: Ambulatory Visit | Attending: Radiation Oncology | Admitting: Radiation Oncology

## 2018-06-01 DIAGNOSIS — C3491 Malignant neoplasm of unspecified part of right bronchus or lung: Secondary | ICD-10-CM | POA: Diagnosis present

## 2018-06-01 DIAGNOSIS — Z510 Encounter for antineoplastic radiation therapy: Secondary | ICD-10-CM | POA: Insufficient documentation

## 2018-06-02 ENCOUNTER — Ambulatory Visit
Admission: RE | Admit: 2018-06-02 | Discharge: 2018-06-02 | Disposition: A | Discharge status: Home / Self Care | Payer: BLUE CROSS/BLUE SHIELD | Source: Ambulatory Visit | Attending: Radiation Oncology | Admitting: Radiation Oncology

## 2018-06-02 ENCOUNTER — Encounter: Payer: Self-pay | Admitting: *Deleted

## 2018-06-02 DIAGNOSIS — C3491 Malignant neoplasm of unspecified part of right bronchus or lung: Secondary | ICD-10-CM | POA: Diagnosis not present

## 2018-06-05 ENCOUNTER — Ambulatory Visit
Admission: RE | Admit: 2018-06-05 | Discharge: 2018-06-05 | Disposition: A | Discharge status: Home / Self Care | Payer: BLUE CROSS/BLUE SHIELD | Source: Ambulatory Visit | Attending: Radiation Oncology | Admitting: Radiation Oncology

## 2018-06-05 ENCOUNTER — Encounter: Payer: Self-pay | Admitting: *Deleted

## 2018-06-05 DIAGNOSIS — C3491 Malignant neoplasm of unspecified part of right bronchus or lung: Secondary | ICD-10-CM | POA: Diagnosis not present

## 2018-06-05 NOTE — Progress Notes (Signed)
[place]  [phone] Fax:(336) [phone]  ID: [name] OB: Mar 21, 1956  MR#: [id number]  BOF#:[id number]  Patient Care Team: Kirk Ruths, MD as PCP - General (Internal Medicine)  CHIEF COMPLAINT: Stage IIIa non-small cell lung cancer, right middle lobe.  INTERVAL HISTORY: Patient returns to clinic today for further evaluation and consideration of cycle 6 of single agent carboplatinum.  He currently feels well and is asymptomatic.  He continues to be anxious. He continues to smoke heavily. He has no neurologic complaints. He denies any recent fevers or illnesses.  He does not complain of dysphasia today.  He has no chest pain, cough, hemoptysis, or shortness of breath. He denies any nausea, vomiting, constipation, or diarrhea. He has no urinary complaints.  Patient offers no specific complaints today.  REVIEW OF SYSTEMS:   Review of Systems  Constitutional: Negative.  Negative for fever, malaise/fatigue and weight loss.  HENT: Negative.  Negative for sore throat.   Respiratory: Negative.  Negative for cough, hemoptysis and shortness of breath.   Cardiovascular: Negative.  Negative for chest pain and leg swelling.  Gastrointestinal: Negative.  Negative for abdominal pain, blood in stool and melena.  Genitourinary: Negative.  Negative for dysuria.  Musculoskeletal: Negative.  Negative for joint pain.  Skin: Negative.  Negative for rash.  Neurological: Negative.  Negative for sensory change, focal weakness, weakness and headaches.  Psychiatric/Behavioral: The patient is nervous/anxious.     As per HPI. Otherwise, a complete review of systems is negative.  PAST MEDICAL HISTORY: Past Medical History:  Diagnosis Date  . Anginal pain ([place])   . Anxiety   . Chest pain   . Chicken pox   . Complication of anesthesia    o2 dropped after neck fusion  . COPD (chronic obstructive pulmonary disease) ([place])   . Coronary artery disease   . Cough      chronic  clear phlegm  . Dysrhythmia    palpitations  . GERD (gastroesophageal reflux disease)    h/o reflux/ hoarsness  . Hematochezia   . Hemorrhoids   . History of chickenpox   . History of colon polyps   . History of Helicobacter pylori infection   . Hoarseness   . Hypertension   . Migraines   . OSA (obstructive sleep apnea)    has CPAP but does not use  . Personal history of tobacco use, presenting hazards to health 03/05/2016  . Pneumonia    5/17  . Raynaud disease   . Raynaud disease   . Raynaud's disease   . Rotator cuff tear    on right  . Shortness of breath dyspnea   . Sleep apnea   . Ulcer (traumatic) of oral mucosa     PAST SURGICAL HISTORY: Past Surgical History:  Procedure Laterality Date  . BACK SURGERY     cervical fusion x 2  . CARDIAC CATHETERIZATION    . CERVICAL DISCECTOMY     x 2  . COLONOSCOPY    . COLONOSCOPY N/A 07/25/2015   Procedure: COLONOSCOPY;  Surgeon: Lollie Sails, MD;  Location: [facility] ENDOSCOPY;  Service: Endoscopy;  Laterality: N/A;  . COLONOSCOPY WITH PROPOFOL N/A 10/04/2017   Procedure: COLONOSCOPY WITH PROPOFOL;  Surgeon: Lollie Sails, MD;  Location: [facility] ENDOSCOPY;  Service: Endoscopy;  Laterality: N/A;  . ELECTROMAGNETIC NAVIGATION BROCHOSCOPY Left 06/28/2016   Procedure: ELECTROMAGNETIC NAVIGATION BRONCHOSCOPY;  Surgeon: Vilinda Boehringer, MD;  Location: [facility] ORS;  Service: Cardiopulmonary;  Laterality: Left;  . ENDOBRONCHIAL  ULTRASOUND N/A 04/11/2018   Procedure: ENDOBRONCHIAL ULTRASOUND;  Surgeon: [name], MD;  Location: [facility] ORS;  Service: Cardiopulmonary;  Laterality: N/A;  . ESOPHAGOGASTRODUODENOSCOPY N/A 07/25/2015   Procedure: ESOPHAGOGASTRODUODENOSCOPY (EGD);  Surgeon: Lollie Sails, MD;  Location: [facility] ENDOSCOPY;  Service: Endoscopy;  Laterality: N/A;  . NASAL SINUS SURGERY     x 2   . PORTA CATH INSERTION N/A 04/24/2018   Procedure: PORTA CATH INSERTION;  Surgeon: Algernon Huxley, MD;  Location: [place]  CV LAB;  Service: Cardiovascular;  Laterality: N/A;  . rotator cuff surgery Right    07/2016  . SEPTOPLASTY    . SKIN GRAFT      FAMILY HISTORY Family History  Problem Relation Age of Onset  . Heart disease Father   . Prostate cancer Father   . Heart disease Paternal Grandmother   . Heart attack Maternal Grandfather 52  . Kidney cancer Neg Hx   . Bladder Cancer Neg Hx   . Other Neg Hx        pituitary abnormality       ADVANCED DIRECTIVES:    HEALTH MAINTENANCE: Social History   Tobacco Use  . Smoking status: Current Every Day Smoker    Packs/day: 1.50    Years: 45.00    Pack years: 67.50    Types: Cigarettes  . Smokeless tobacco: Never Used  Substance Use Topics  . Alcohol use: Yes    Alcohol/week: 2.0 standard drinks    Types: 2 Standard drinks or equivalent per week    Comment: moderate  . Drug use: No     Allergies  Allergen Reactions  . Lisinopril Rash  . Varenicline Rash    Current Outpatient Medications  Medication Sig Dispense Refill  . acetaminophen (TYLENOL) 500 MG tablet Take 1,000 mg by mouth every 6 (six) hours as needed (for pain.).    Marland Kitchen albuterol (PROAIR HFA) 108 (90 Base) MCG/ACT inhaler Inhale 2 puffs into the lungs every 6 (six) hours as needed for wheezing or shortness of breath. 1 Inhaler 3  . atorvastatin (LIPITOR) 40 MG tablet Take 40 mg by mouth at bedtime.     Marland Kitchen BREO ELLIPTA 200-25 MCG/INH AEPB TAKE 1 PUFF BY MOUTH EVERY DAY 60 each 6  . busPIRone (BUSPAR) 15 MG tablet Take 15 mg by mouth 2 (two) times daily.   10  . diazepam (VALIUM) 5 MG tablet Take 5 mg by mouth every 12 (twelve) hours as needed for anxiety (SCHEDULED EVERY NIGHT).     . DULoxetine (CYMBALTA) 60 MG capsule Take 60 mg by mouth daily.     . fluticasone (FLONASE) 50 MCG/ACT nasal spray Place 1 spray into both nostrils daily as needed (for allergies.).     Marland Kitchen ipratropium-albuterol (DUONEB) 0.5-2.5 (3) MG/3ML SOLN Take 3 mLs by nebulization every 6 (six) hours. 360 mL 5   . lidocaine-prilocaine (EMLA) cream Apply to affected area once 30 g 3  . metoprolol tartrate (LOPRESSOR) 50 MG tablet Take 50 mg by mouth 2 (two) times daily.    . mirabegron ER (MYRBETRIQ) 50 MG TB24 tablet Take 1 tablet (50 mg total) by mouth daily. 90 tablet 3  . Multiple Vitamin (MULTIVITAMIN WITH MINERALS) TABS tablet Take 1 tablet by mouth at bedtime.    . nicotine (NICODERM CQ - DOSED IN MG/24 HOURS) 21 mg/24hr patch Place 1 patch (21 mg total) onto the skin daily. 30 patch 12  . nitroGLYCERIN (NITROSTAT) 0.4 MG SL tablet Place 0.4 mg under the  tongue every 5 (five) minutes as needed for chest pain.     . OXYGEN Inhale 2 L into the lungs at bedtime.     . pantoprazole (PROTONIX) 40 MG tablet Take 40 mg by mouth daily.  3  . potassium chloride (K-DUR,KLOR-CON) 10 MEQ tablet Take 10 mEq by mouth daily.    Marland Kitchen SPIRIVA RESPIMAT 2.5 MCG/ACT AERS INHALE 2 PUFFS INTO THE LUNGS DAILY. 4 g 6  . sucralfate (CARAFATE) 1 g tablet Take 1 tablet (1 g total) by mouth 3 (three) times daily before meals. Dissolve in warm water, swish and swallow 90 tablet 6  . ondansetron (ZOFRAN) 8 MG tablet Take 1 tablet (8 mg total) by mouth 2 (two) times daily as needed for refractory nausea / vomiting. (Patient not taking: Reported on 05/24/2018) 30 tablet 2  . prochlorperazine (COMPAZINE) 10 MG tablet Take 1 tablet (10 mg total) by mouth every 6 (six) hours as needed (Nausea or vomiting). (Patient not taking: Reported on 05/24/2018) 60 tablet 2   No current facility-administered medications for this visit.     OBJECTIVE: Vitals:   06/07/18 1012  BP: 128/83  Pulse: 76  Resp: 18  Temp: 98 F (36.7 C)  SpO2: 98%     Body mass index is 27.74 kg/m.    ECOG FS:0 - Asymptomatic  General: Well-developed, well-nourished, no acute distress. Eyes: Pink conjunctiva, anicteric sclera. HEENT: Normocephalic, moist mucous membranes. Lungs: Clear to auscultation bilaterally. Heart: Regular rate and rhythm. No rubs,  murmurs, or gallops. Abdomen: Soft, nontender, nondistended. No organomegaly noted, normoactive bowel sounds. Musculoskeletal: No edema, cyanosis, or clubbing. Neuro: Alert, answering all questions appropriately. Cranial nerves grossly intact. Skin: No rashes or petechiae noted. Psych: Normal affect.  LAB RESULTS:  Lab Results  Component Value Date   NA 132 (L) 06/07/2018   K 4.0 06/07/2018   CL 96 (L) 06/07/2018   CO2 25 06/07/2018   GLUCOSE 90 06/07/2018   BUN 12 06/07/2018   CREATININE 0.69 06/07/2018   CALCIUM 9.3 06/07/2018   PROT 6.7 06/07/2018   ALBUMIN 3.8 06/07/2018   AST 22 06/07/2018   ALT 22 06/07/2018   ALKPHOS 78 06/07/2018   BILITOT 0.7 06/07/2018   GFRNONAA >60 06/07/2018   GFRAA >60 06/07/2018    Lab Results  Component Value Date   WBC 6.4 06/07/2018   NEUTROABS 5.4 06/07/2018   HGB 14.1 06/07/2018   HCT 39.6 (L) 06/07/2018   MCV 100.2 (H) 06/07/2018   PLT 107 (L) 06/07/2018     STUDIES:  ASSESSMENT: Stage IIIa non-small cell lung cancer, right middle lobe.  PLAN:    1. Stage IIIa non-small cell lung cancer, right middle lobe: Case discussed with pathologist and unable to determine whether this is adenocarcinoma or squamous carcinoma.  There is also insufficient tissue to do further testing.  Liquid biopsy did not reveal any actionable mutations. PET scan results from Mar 16, 2018 reviewed independently confirming stage of disease.  MRI of the brain completed on Mar 28, 2018 reviewed independently did not reveal metastatic disease.  Continue daily XRT completing on June 26, 2018.  Patient had a reaction to Taxol during cycle 1, therefore this is was discontinued.  Proceed with cycle 6 of weekly single agent carboplatin today. At the conclusion of his XRT, will proceed with maintenance Imfinzi every 2 weeks for up to 12 months.  Return to clinic in 1 week for further evaluation and consideration of cycle 7.   2.  Secondary polycythemia:  Resolved.   Likely secondary to heavy tobacco use.  Previously, the remainder of his laboratory work, including JAK-2 mutation, was either negative or within normal limits.  3. Left upper lobe pulmonary nodule: Imaging results as above.  Previously, biopsy completed on October 04, 2017 was negative for malignancy.  4.  Colon polyps:  Patient has a personal history of greater then 10 adenomatous polyps on his most recent conoloscopy. He does not know of any family history of increased polyps or colon cancer.  Genetic testing to assess for the APC mutation for FAP or AFAP was negative. Continue colonoscopies as per GI. 5. Tobacco Use: Patient continues to heavily smoke, but has decreased significantly.  He expressed understanding by continue to smokeless increases his chance of recurrence. 6. Anxiety: Chronic.  Continue IV Ativan with each treatment.  Continue treatment and evaluation per primary care. 7.  Hyponatremia: Patient's sodium level remains stable at 132.  Monitor. 8.  Thrombocytopenia: Mild, proceed with treatment as above.  Patient expressed understanding and was in agreement with this plan. He also understands that He can call clinic at any time with any questions, concerns, or complaints.    [name], MD   06/10/2018 7:07 PM

## 2018-06-06 ENCOUNTER — Encounter: Payer: Self-pay | Admitting: *Deleted

## 2018-06-06 ENCOUNTER — Ambulatory Visit
Admission: RE | Admit: 2018-06-06 | Discharge: 2018-06-06 | Disposition: A | Discharge status: Home / Self Care | Payer: BLUE CROSS/BLUE SHIELD | Source: Ambulatory Visit | Attending: Radiation Oncology | Admitting: Radiation Oncology

## 2018-06-06 DIAGNOSIS — C3491 Malignant neoplasm of unspecified part of right bronchus or lung: Secondary | ICD-10-CM | POA: Diagnosis not present

## 2018-06-07 ENCOUNTER — Inpatient Hospital Stay (HOSPITAL_BASED_OUTPATIENT_CLINIC_OR_DEPARTMENT_OTHER): Payer: BLUE CROSS/BLUE SHIELD | Admitting: Oncology

## 2018-06-07 ENCOUNTER — Ambulatory Visit
Admission: RE | Admit: 2018-06-07 | Discharge: 2018-06-07 | Disposition: A | Discharge status: Home / Self Care | Payer: BLUE CROSS/BLUE SHIELD | Source: Ambulatory Visit | Attending: Radiation Oncology | Admitting: Radiation Oncology

## 2018-06-07 ENCOUNTER — Encounter: Payer: Self-pay | Admitting: *Deleted

## 2018-06-07 ENCOUNTER — Inpatient Hospital Stay: Payer: BLUE CROSS/BLUE SHIELD

## 2018-06-07 ENCOUNTER — Inpatient Hospital Stay: Payer: BLUE CROSS/BLUE SHIELD | Attending: Oncology

## 2018-06-07 VITALS — BP 128/83 | HR 76 | Temp 98.0°F | Resp 18 | Wt 204.5 lb

## 2018-06-07 DIAGNOSIS — I7300 Raynaud's syndrome without gangrene: Secondary | ICD-10-CM

## 2018-06-07 DIAGNOSIS — K219 Gastro-esophageal reflux disease without esophagitis: Secondary | ICD-10-CM | POA: Diagnosis not present

## 2018-06-07 DIAGNOSIS — Z9981 Dependence on supplemental oxygen: Secondary | ICD-10-CM | POA: Diagnosis not present

## 2018-06-07 DIAGNOSIS — Z86010 Personal history of colonic polyps: Secondary | ICD-10-CM | POA: Insufficient documentation

## 2018-06-07 DIAGNOSIS — J449 Chronic obstructive pulmonary disease, unspecified: Secondary | ICD-10-CM | POA: Insufficient documentation

## 2018-06-07 DIAGNOSIS — Z5111 Encounter for antineoplastic chemotherapy: Secondary | ICD-10-CM | POA: Diagnosis not present

## 2018-06-07 DIAGNOSIS — E871 Hypo-osmolality and hyponatremia: Secondary | ICD-10-CM

## 2018-06-07 DIAGNOSIS — I2510 Atherosclerotic heart disease of native coronary artery without angina pectoris: Secondary | ICD-10-CM | POA: Diagnosis not present

## 2018-06-07 DIAGNOSIS — Z8042 Family history of malignant neoplasm of prostate: Secondary | ICD-10-CM | POA: Diagnosis not present

## 2018-06-07 DIAGNOSIS — Z79899 Other long term (current) drug therapy: Secondary | ICD-10-CM | POA: Insufficient documentation

## 2018-06-07 DIAGNOSIS — I10 Essential (primary) hypertension: Secondary | ICD-10-CM

## 2018-06-07 DIAGNOSIS — F419 Anxiety disorder, unspecified: Secondary | ICD-10-CM

## 2018-06-07 DIAGNOSIS — G4733 Obstructive sleep apnea (adult) (pediatric): Secondary | ICD-10-CM | POA: Diagnosis not present

## 2018-06-07 DIAGNOSIS — C3491 Malignant neoplasm of unspecified part of right bronchus or lung: Secondary | ICD-10-CM

## 2018-06-07 DIAGNOSIS — F17210 Nicotine dependence, cigarettes, uncomplicated: Secondary | ICD-10-CM | POA: Insufficient documentation

## 2018-06-07 DIAGNOSIS — D696 Thrombocytopenia, unspecified: Secondary | ICD-10-CM | POA: Diagnosis not present

## 2018-06-07 DIAGNOSIS — C342 Malignant neoplasm of middle lobe, bronchus or lung: Secondary | ICD-10-CM | POA: Diagnosis present

## 2018-06-07 LAB — CBC WITH DIFFERENTIAL/PLATELET
BASOS ABS: 0 10*3/uL (ref 0–0.1)
BASOS PCT: 1 %
Eosinophils Absolute: 0 10*3/uL (ref 0–0.7)
Eosinophils Relative: 1 %
HCT: 39.6 % — ABNORMAL LOW (ref 40.0–52.0)
HEMOGLOBIN: 14.1 g/dL (ref 13.0–18.0)
Lymphocytes Relative: 10 %
Lymphs Abs: 0.6 10*3/uL — ABNORMAL LOW (ref 1.0–3.6)
MCH: 35.7 pg — ABNORMAL HIGH (ref 26.0–34.0)
MCHC: 35.6 g/dL (ref 32.0–36.0)
MCV: 100.2 fL — ABNORMAL HIGH (ref 80.0–100.0)
MONOS PCT: 6 %
Monocytes Absolute: 0.4 10*3/uL (ref 0.2–1.0)
NEUTROS ABS: 5.4 10*3/uL (ref 1.4–6.5)
NEUTROS PCT: 82 %
Platelets: 107 10*3/uL — ABNORMAL LOW (ref 150–440)
RBC: 3.95 MIL/uL — AB (ref 4.40–5.90)
RDW: 13.9 % (ref 11.5–14.5)
WBC: 6.4 10*3/uL (ref 3.8–10.6)

## 2018-06-07 LAB — COMPREHENSIVE METABOLIC PANEL
ALT: 22 U/L (ref 0–44)
ANION GAP: 11 (ref 5–15)
AST: 22 U/L (ref 15–41)
Albumin: 3.8 g/dL (ref 3.5–5.0)
Alkaline Phosphatase: 78 U/L (ref 38–126)
BUN: 12 mg/dL (ref 6–20)
CALCIUM: 9.3 mg/dL (ref 8.9–10.3)
CO2: 25 mmol/L (ref 22–32)
Chloride: 96 mmol/L — ABNORMAL LOW (ref 98–111)
Creatinine, Ser: 0.69 mg/dL (ref 0.61–1.24)
Glucose, Bld: 90 mg/dL (ref 70–99)
Potassium: 4 mmol/L (ref 3.5–5.1)
SODIUM: 132 mmol/L — AB (ref 135–145)
TOTAL PROTEIN: 6.7 g/dL (ref 6.5–8.1)
Total Bilirubin: 0.7 mg/dL (ref 0.3–1.2)

## 2018-06-07 MED ORDER — DEXAMETHASONE SODIUM PHOSPHATE 10 MG/ML IJ SOLN
10.0000 mg | Freq: Once | INTRAMUSCULAR | Status: AC
  Administered 2018-06-07: 10 mg via INTRAVENOUS
  Filled 2018-06-07: qty 1

## 2018-06-07 MED ORDER — SODIUM CHLORIDE 0.9% FLUSH
10.0000 mL | INTRAVENOUS | Status: DC | PRN
  Administered 2018-06-07: 10 mL via INTRAVENOUS
  Filled 2018-06-07: qty 10

## 2018-06-07 MED ORDER — FAMOTIDINE IN NACL 20-0.9 MG/50ML-% IV SOLN
20.0000 mg | Freq: Once | INTRAVENOUS | Status: AC
  Administered 2018-06-07: 20 mg via INTRAVENOUS
  Filled 2018-06-07: qty 50

## 2018-06-07 MED ORDER — PALONOSETRON HCL INJECTION 0.25 MG/5ML
0.2500 mg | Freq: Once | INTRAVENOUS | Status: AC
  Administered 2018-06-07: 0.25 mg via INTRAVENOUS
  Filled 2018-06-07: qty 5

## 2018-06-07 MED ORDER — HEPARIN SOD (PORK) LOCK FLUSH 100 UNIT/ML IV SOLN
500.0000 [IU] | Freq: Once | INTRAVENOUS | Status: AC
  Administered 2018-06-07: 500 [IU] via INTRAVENOUS
  Filled 2018-06-07: qty 5

## 2018-06-07 MED ORDER — SODIUM CHLORIDE 0.9 % IV SOLN: 10.0000 mg | Freq: Once | INTRAVENOUS | Status: DC

## 2018-06-07 MED ORDER — LORAZEPAM 2 MG/ML IJ SOLN
0.5000 mg | Freq: Once | INTRAMUSCULAR | Status: AC
  Administered 2018-06-07: 0.5 mg via INTRAVENOUS
  Filled 2018-06-07: qty 1

## 2018-06-07 MED ORDER — SODIUM CHLORIDE 0.9 % IV SOLN
299.4000 mg | Freq: Once | INTRAVENOUS | Status: AC
  Administered 2018-06-07: 300 mg via INTRAVENOUS
  Filled 2018-06-07: qty 30

## 2018-06-07 MED ORDER — DIPHENHYDRAMINE HCL 50 MG/ML IJ SOLN
25.0000 mg | Freq: Once | INTRAMUSCULAR | Status: AC
Start: 2018-06-07 — End: 2018-06-07
  Administered 2018-06-07: 25 mg via INTRAVENOUS
  Filled 2018-06-07: qty 1

## 2018-06-07 MED ORDER — SODIUM CHLORIDE 0.9 % IV SOLN
Freq: Once | INTRAVENOUS | Status: AC
  Administered 2018-06-07: 11:00:00 via INTRAVENOUS
  Filled 2018-06-07: qty 1000

## 2018-06-07 NOTE — Progress Notes (Signed)
All premeds verified per Gearldine Bienenstock, with Melton Krebs, RN

## 2018-06-07 NOTE — Progress Notes (Signed)
Pt in for follow up, states "doing good".  Denies any difficulties, has some questions regarding immunotherapy after chemo is completed.

## 2018-06-08 ENCOUNTER — Ambulatory Visit
Admission: RE | Admit: 2018-06-08 | Discharge: 2018-06-08 | Disposition: A | Discharge status: Home / Self Care | Payer: BLUE CROSS/BLUE SHIELD | Source: Ambulatory Visit | Attending: Radiation Oncology | Admitting: Radiation Oncology

## 2018-06-08 ENCOUNTER — Encounter: Payer: Self-pay | Admitting: *Deleted

## 2018-06-08 DIAGNOSIS — C3491 Malignant neoplasm of unspecified part of right bronchus or lung: Secondary | ICD-10-CM | POA: Diagnosis not present

## 2018-06-09 ENCOUNTER — Encounter: Payer: Self-pay | Admitting: *Deleted

## 2018-06-09 ENCOUNTER — Ambulatory Visit
Admission: RE | Admit: 2018-06-09 | Discharge: 2018-06-09 | Disposition: A | Discharge status: Home / Self Care | Payer: BLUE CROSS/BLUE SHIELD | Source: Ambulatory Visit | Attending: Radiation Oncology | Admitting: Radiation Oncology

## 2018-06-09 DIAGNOSIS — C3491 Malignant neoplasm of unspecified part of right bronchus or lung: Secondary | ICD-10-CM | POA: Diagnosis not present

## 2018-06-11 NOTE — Progress Notes (Signed)
[place]  Telephone:(336) [phone] Fax:(336) [phone]  ID: Paul Carpenter OB: Feb 24, 1956  MR#: [id number]  KGY#:[id number]  Patient Care Team: Kirk Ruths, MD as PCP - General (Internal Medicine)  CHIEF COMPLAINT: Stage IIIa non-small cell lung cancer, right middle lobe.  INTERVAL HISTORY: Patient returns to clinic today for further evaluation and consideration of cycle 7 of single agent carboplatinum.  He continues to tolerate his treatments well without significant side effects.  He currently feels well and is asymptomatic. He continues to be anxious. He continues to smoke heavily, but states he is cutting back. He has no neurologic complaints. He denies any recent fevers or illnesses.  He does not complain of dysphasia today.  He has no chest pain, cough, hemoptysis, or shortness of breath. He denies any nausea, vomiting, constipation, or diarrhea. He has no urinary complaints.  Patient offers no specific complaints today.  REVIEW OF SYSTEMS:   Review of Systems  Constitutional: Negative.  Negative for fever, malaise/fatigue and weight loss.  HENT: Negative.  Negative for sore throat.   Respiratory: Negative.  Negative for cough, hemoptysis and shortness of breath.   Cardiovascular: Negative.  Negative for chest pain and leg swelling.  Gastrointestinal: Negative.  Negative for abdominal pain, blood in stool and melena.  Genitourinary: Negative.  Negative for dysuria.  Musculoskeletal: Negative.  Negative for joint pain.  Skin: Negative.  Negative for rash.  Neurological: Negative.  Negative for sensory change, focal weakness, weakness and headaches.  Psychiatric/Behavioral: The patient is nervous/anxious.     As per HPI. Otherwise, a complete review of systems is negative.  PAST MEDICAL HISTORY: Past Medical History:  Diagnosis Date  . Anginal pain ([place])   . Anxiety   . Chest pain   . Chicken pox   . Complication of anesthesia    o2 dropped  after neck fusion  . COPD (chronic obstructive pulmonary disease) ([place])   . Coronary artery disease   . Cough    chronic  clear phlegm  . Dysrhythmia    palpitations  . GERD (gastroesophageal reflux disease)    h/o reflux/ hoarsness  . Hematochezia   . Hemorrhoids   . History of chickenpox   . History of colon polyps   . History of Helicobacter pylori infection   . Hoarseness   . Hypertension   . Migraines   . OSA (obstructive sleep apnea)    has CPAP but does not use  . Personal history of tobacco use, presenting hazards to health 03/05/2016  . Pneumonia    5/17  . Raynaud disease   . Raynaud disease   . Raynaud's disease   . Rotator cuff tear    on right  . Shortness of breath dyspnea   . Sleep apnea   . Ulcer (traumatic) of oral mucosa     PAST SURGICAL HISTORY: Past Surgical History:  Procedure Laterality Date  . BACK SURGERY     cervical fusion x 2  . CARDIAC CATHETERIZATION    . CERVICAL DISCECTOMY     x 2  . COLONOSCOPY    . COLONOSCOPY N/A 07/25/2015   Procedure: COLONOSCOPY;  Surgeon: Lollie Sails, MD;  Location: [facility] ENDOSCOPY;  Service: Endoscopy;  Laterality: N/A;  . COLONOSCOPY WITH PROPOFOL N/A 10/04/2017   Procedure: COLONOSCOPY WITH PROPOFOL;  Surgeon: Lollie Sails, MD;  Location: [facility] ENDOSCOPY;  Service: Endoscopy;  Laterality: N/A;  . ELECTROMAGNETIC NAVIGATION BROCHOSCOPY Left 06/28/2016   Procedure: ELECTROMAGNETIC NAVIGATION BRONCHOSCOPY;  Surgeon:  Vilinda Boehringer, MD;  Location: [facility] ORS;  Service: Cardiopulmonary;  Laterality: Left;  . ENDOBRONCHIAL ULTRASOUND N/A 04/11/2018   Procedure: ENDOBRONCHIAL ULTRASOUND;  Surgeon: [name], MD;  Location: [facility] ORS;  Service: Cardiopulmonary;  Laterality: N/A;  . ESOPHAGOGASTRODUODENOSCOPY N/A 07/25/2015   Procedure: ESOPHAGOGASTRODUODENOSCOPY (EGD);  Surgeon: Lollie Sails, MD;  Location: [facility] ENDOSCOPY;  Service: Endoscopy;  Laterality: N/A;  . NASAL SINUS SURGERY     x 2   . PORTA CATH  INSERTION N/A 04/24/2018   Procedure: PORTA CATH INSERTION;  Surgeon: Algernon Huxley, MD;  Location: [place] CV LAB;  Service: Cardiovascular;  Laterality: N/A;  . rotator cuff surgery Right    07/2016  . SEPTOPLASTY    . SKIN GRAFT      FAMILY HISTORY Family History  Problem Relation Age of Onset  . Heart disease Father   . Prostate cancer Father   . Heart disease Paternal Grandmother   . Heart attack Maternal Grandfather 52  . Kidney cancer Neg Hx   . Bladder Cancer Neg Hx   . Other Neg Hx        pituitary abnormality       ADVANCED DIRECTIVES:    HEALTH MAINTENANCE: Social History   Tobacco Use  . Smoking status: Current Every Day Smoker    Packs/day: 1.50    Years: 45.00    Pack years: 67.50    Types: Cigarettes  . Smokeless tobacco: Never Used  Substance Use Topics  . Alcohol use: Yes    Alcohol/week: 2.0 standard drinks    Types: 2 Standard drinks or equivalent per week    Comment: moderate  . Drug use: No     Allergies  Allergen Reactions  . Lisinopril Rash  . Varenicline Rash    Current Outpatient Medications  Medication Sig Dispense Refill  . acetaminophen (TYLENOL) 500 MG tablet Take 1,000 mg by mouth every 6 (six) hours as needed (for pain.).    Marland Kitchen albuterol (PROAIR HFA) 108 (90 Base) MCG/ACT inhaler Inhale 2 puffs into the lungs every 6 (six) hours as needed for wheezing or shortness of breath. 1 Inhaler 3  . atorvastatin (LIPITOR) 40 MG tablet Take 40 mg by mouth at bedtime.     Marland Kitchen BREO ELLIPTA 200-25 MCG/INH AEPB TAKE 1 PUFF BY MOUTH EVERY DAY 60 each 6  . busPIRone (BUSPAR) 15 MG tablet Take 15 mg by mouth 2 (two) times daily.   10  . diazepam (VALIUM) 5 MG tablet Take 5 mg by mouth every 12 (twelve) hours as needed for anxiety (SCHEDULED EVERY NIGHT).     . DULoxetine (CYMBALTA) 60 MG capsule Take 60 mg by mouth daily.     . fluticasone (FLONASE) 50 MCG/ACT nasal spray Place 1 spray into both nostrils daily as needed (for allergies.).     Marland Kitchen  ipratropium-albuterol (DUONEB) 0.5-2.5 (3) MG/3ML SOLN Take 3 mLs by nebulization every 6 (six) hours. (Patient taking differently: Take 3 mLs by nebulization every 6 (six) hours as needed. ) 360 mL 5  . lidocaine-prilocaine (EMLA) cream Apply to affected area once 30 g 3  . magic mouthwash w/lidocaine SOLN Take 5 mLs by mouth 3 (three) times daily as needed for mouth pain.    . metoprolol tartrate (LOPRESSOR) 50 MG tablet Take 50 mg by mouth 2 (two) times daily.    . mirabegron ER (MYRBETRIQ) 50 MG TB24 tablet Take 1 tablet (50 mg total) by mouth daily. 90 tablet 3  . Multiple Vitamin (  MULTIVITAMIN WITH MINERALS) TABS tablet Take 1 tablet by mouth daily.     . nicotine (NICODERM CQ - DOSED IN MG/24 HOURS) 21 mg/24hr patch Place 1 patch (21 mg total) onto the skin daily. 30 patch 12  . nitroGLYCERIN (NITROSTAT) 0.4 MG SL tablet Place 0.4 mg under the tongue every 5 (five) minutes as needed for chest pain.     . OXYGEN Inhale 2 L into the lungs at bedtime.     . pantoprazole (PROTONIX) 40 MG tablet Take 40 mg by mouth daily.  3  . potassium chloride (K-DUR,KLOR-CON) 10 MEQ tablet Take 10 mEq by mouth daily.    Marland Kitchen SPIRIVA RESPIMAT 2.5 MCG/ACT AERS INHALE 2 PUFFS INTO THE LUNGS DAILY. 4 g 6  . sucralfate (CARAFATE) 1 g tablet Take 1 tablet (1 g total) by mouth 3 (three) times daily before meals. Dissolve in warm water, swish and swallow 90 tablet 6  . ondansetron (ZOFRAN) 8 MG tablet Take 1 tablet (8 mg total) by mouth 2 (two) times daily as needed for refractory nausea / vomiting. (Patient not taking: Reported on 05/24/2018) 30 tablet 2  . prochlorperazine (COMPAZINE) 10 MG tablet Take 1 tablet (10 mg total) by mouth every 6 (six) hours as needed (Nausea or vomiting). (Patient not taking: Reported on 05/24/2018) 60 tablet 2   Current Facility-Administered Medications  Medication Dose Route Frequency Provider Last Rate Last Dose  . heparin lock flush 100 unit/mL  500 Units Intravenous Once [name], MD      . sodium chloride flush (NS) 0.9 % injection 10 mL  10 mL Intravenous Once [name], MD        OBJECTIVE: Vitals:   06/14/18 0914  BP: 121/80  Pulse: 80  Resp: 18  Temp: 97.6 F (36.4 C)     Body mass index is 27.76 kg/m.    ECOG FS:0 - Asymptomatic  General: Well-developed, well-nourished, no acute distress. Eyes: Pink conjunctiva, anicteric sclera. HEENT: Normocephalic, moist mucous membranes. Lungs: Clear to auscultation bilaterally. Heart: Regular rate and rhythm. No rubs, murmurs, or gallops. Abdomen: Soft, nontender, nondistended. No organomegaly noted, normoactive bowel sounds. Musculoskeletal: No edema, cyanosis, or clubbing. Neuro: Alert, answering all questions appropriately. Cranial nerves grossly intact. Skin: No rashes or petechiae noted. Psych: Normal affect.  LAB RESULTS:  Lab Results  Component Value Date   NA 132 (L) 06/14/2018   K 4.0 06/14/2018   CL 98 06/14/2018   CO2 24 06/14/2018   GLUCOSE 130 (H) 06/14/2018   BUN 8 06/14/2018   CREATININE 0.59 (L) 06/14/2018   CALCIUM 8.6 (L) 06/14/2018   PROT 6.6 06/14/2018   ALBUMIN 3.8 06/14/2018   AST 23 06/14/2018   ALT 21 06/14/2018   ALKPHOS 78 06/14/2018   BILITOT 0.7 06/14/2018   GFRNONAA >60 06/14/2018   GFRAA >60 06/14/2018    Lab Results  Component Value Date   WBC 5.3 06/14/2018   NEUTROABS 4.4 06/14/2018   HGB 13.7 06/14/2018   HCT 38.8 (L) 06/14/2018   MCV 100.5 (H) 06/14/2018   PLT 165 06/14/2018     STUDIES:  ASSESSMENT: Stage IIIa non-small cell lung cancer, right middle lobe.  PLAN:    1. Stage IIIa non-small cell lung cancer, right middle lobe: Case discussed with pathologist and unable to determine whether this is adenocarcinoma or squamous carcinoma.  There is also insufficient tissue to do further testing.  Liquid biopsy did not reveal any actionable mutations. PET scan results from Mar 16, 2018 reviewed independently confirming stage of  disease.  MRI of the brain completed on Mar 28, 2018 reviewed independently did not reveal metastatic disease.  Continue daily XRT completing on June 26, 2018.  Patient had a reaction to Taxol during cycle 1, therefore this is was discontinued.  Proceed with cycle 7 of weekly single agent carboplatin today.  Return to clinic in 1 week for further evaluation and consideration of cycle 8.  At the conclusion of his XRT, will proceed with maintenance Imfinzi every 2 weeks for up to 12 months.  Plan to reimage with PET scan prior to initiating Imfinzi. 2.  Secondary polycythemia: Resolved.  Likely secondary to heavy tobacco use.  Previously, the remainder of his laboratory work, including JAK-2 mutation, was either negative or within normal limits.  3. Left upper lobe pulmonary nodule: Imaging results as above.  Previously, biopsy completed on October 04, 2017 was negative for malignancy.  4.  Colon polyps:  Patient has a personal history of greater then 10 adenomatous polyps on his most recent conoloscopy. He does not know of any family history of increased polyps or colon cancer.  Genetic testing to assess for the APC mutation for FAP or AFAP was negative. Continue colonoscopies as per GI. 5. Tobacco Use: Patient continues to heavily smoke, but has decreased significantly.  He expressed understanding by continue to smokeless increases his chance of recurrence. 6. Anxiety: Chronic.  Continue IV Ativan with each treatment.  Continue treatment and evaluation per primary care. 7.  Hyponatremia: Chronic and unchanged.  Patient's sodium level remains stable at 132.  Monitor. 8.  Thrombocytopenia: Resolved.  Patient expressed understanding and was in agreement with this plan. He also understands that He can call clinic at any time with any questions, concerns, or complaints.    [name], MD   06/18/2018 6:47 AM

## 2018-06-12 ENCOUNTER — Encounter: Payer: Self-pay | Admitting: *Deleted

## 2018-06-12 ENCOUNTER — Ambulatory Visit
Admission: RE | Admit: 2018-06-12 | Discharge: 2018-06-12 | Disposition: A | Discharge status: Home / Self Care | Payer: BLUE CROSS/BLUE SHIELD | Source: Ambulatory Visit | Attending: Radiation Oncology | Admitting: Radiation Oncology

## 2018-06-12 DIAGNOSIS — C3491 Malignant neoplasm of unspecified part of right bronchus or lung: Secondary | ICD-10-CM | POA: Diagnosis not present

## 2018-06-13 ENCOUNTER — Ambulatory Visit
Admission: RE | Admit: 2018-06-13 | Discharge: 2018-06-13 | Disposition: A | Discharge status: Home / Self Care | Payer: BLUE CROSS/BLUE SHIELD | Source: Ambulatory Visit | Attending: Radiation Oncology | Admitting: Radiation Oncology

## 2018-06-13 ENCOUNTER — Encounter: Payer: Self-pay | Admitting: *Deleted

## 2018-06-13 DIAGNOSIS — C3491 Malignant neoplasm of unspecified part of right bronchus or lung: Secondary | ICD-10-CM | POA: Diagnosis not present

## 2018-06-14 ENCOUNTER — Inpatient Hospital Stay: Payer: BLUE CROSS/BLUE SHIELD

## 2018-06-14 ENCOUNTER — Ambulatory Visit
Admission: RE | Admit: 2018-06-14 | Discharge: 2018-06-14 | Disposition: A | Discharge status: Home / Self Care | Payer: BLUE CROSS/BLUE SHIELD | Source: Ambulatory Visit | Attending: Radiation Oncology | Admitting: Radiation Oncology

## 2018-06-14 ENCOUNTER — Encounter: Payer: Self-pay | Admitting: *Deleted

## 2018-06-14 ENCOUNTER — Encounter: Payer: Self-pay | Admitting: Oncology

## 2018-06-14 ENCOUNTER — Inpatient Hospital Stay (HOSPITAL_BASED_OUTPATIENT_CLINIC_OR_DEPARTMENT_OTHER): Payer: BLUE CROSS/BLUE SHIELD | Admitting: Oncology

## 2018-06-14 VITALS — BP 121/80 | HR 80 | Temp 97.6°F | Resp 18 | Ht 72.0 in | Wt 204.7 lb

## 2018-06-14 DIAGNOSIS — I10 Essential (primary) hypertension: Secondary | ICD-10-CM

## 2018-06-14 DIAGNOSIS — Z79899 Other long term (current) drug therapy: Secondary | ICD-10-CM

## 2018-06-14 DIAGNOSIS — D696 Thrombocytopenia, unspecified: Secondary | ICD-10-CM

## 2018-06-14 DIAGNOSIS — F17210 Nicotine dependence, cigarettes, uncomplicated: Secondary | ICD-10-CM

## 2018-06-14 DIAGNOSIS — Z8042 Family history of malignant neoplasm of prostate: Secondary | ICD-10-CM

## 2018-06-14 DIAGNOSIS — C3491 Malignant neoplasm of unspecified part of right bronchus or lung: Secondary | ICD-10-CM | POA: Diagnosis not present

## 2018-06-14 DIAGNOSIS — Z86010 Personal history of colonic polyps: Secondary | ICD-10-CM

## 2018-06-14 DIAGNOSIS — E871 Hypo-osmolality and hyponatremia: Secondary | ICD-10-CM

## 2018-06-14 DIAGNOSIS — J449 Chronic obstructive pulmonary disease, unspecified: Secondary | ICD-10-CM | POA: Diagnosis not present

## 2018-06-14 DIAGNOSIS — C342 Malignant neoplasm of middle lobe, bronchus or lung: Secondary | ICD-10-CM | POA: Diagnosis not present

## 2018-06-14 DIAGNOSIS — Z9981 Dependence on supplemental oxygen: Secondary | ICD-10-CM

## 2018-06-14 DIAGNOSIS — G4733 Obstructive sleep apnea (adult) (pediatric): Secondary | ICD-10-CM

## 2018-06-14 DIAGNOSIS — K219 Gastro-esophageal reflux disease without esophagitis: Secondary | ICD-10-CM

## 2018-06-14 DIAGNOSIS — I2510 Atherosclerotic heart disease of native coronary artery without angina pectoris: Secondary | ICD-10-CM

## 2018-06-14 DIAGNOSIS — F419 Anxiety disorder, unspecified: Secondary | ICD-10-CM

## 2018-06-14 DIAGNOSIS — I7300 Raynaud's syndrome without gangrene: Secondary | ICD-10-CM

## 2018-06-14 LAB — CBC WITH DIFFERENTIAL/PLATELET
BASOS PCT: 0 %
Basophils Absolute: 0 10*3/uL (ref 0–0.1)
EOS PCT: 0 %
Eosinophils Absolute: 0 10*3/uL (ref 0–0.7)
HCT: 38.8 % — ABNORMAL LOW (ref 40.0–52.0)
Hemoglobin: 13.7 g/dL (ref 13.0–18.0)
LYMPHS ABS: 0.6 10*3/uL — AB (ref 1.0–3.6)
Lymphocytes Relative: 11 %
MCH: 35.6 pg — AB (ref 26.0–34.0)
MCHC: 35.4 g/dL (ref 32.0–36.0)
MCV: 100.5 fL — ABNORMAL HIGH (ref 80.0–100.0)
MONOS PCT: 6 %
Monocytes Absolute: 0.3 10*3/uL (ref 0.2–1.0)
NEUTROS PCT: 83 %
Neutro Abs: 4.4 10*3/uL (ref 1.4–6.5)
PLATELETS: 165 10*3/uL (ref 150–440)
RBC: 3.86 MIL/uL — AB (ref 4.40–5.90)
RDW: 14.2 % (ref 11.5–14.5)
WBC: 5.3 10*3/uL (ref 3.8–10.6)

## 2018-06-14 LAB — COMPREHENSIVE METABOLIC PANEL
ALK PHOS: 78 U/L (ref 38–126)
ALT: 21 U/L (ref 0–44)
AST: 23 U/L (ref 15–41)
Albumin: 3.8 g/dL (ref 3.5–5.0)
Anion gap: 10 (ref 5–15)
BUN: 8 mg/dL (ref 6–20)
CALCIUM: 8.6 mg/dL — AB (ref 8.9–10.3)
CHLORIDE: 98 mmol/L (ref 98–111)
CO2: 24 mmol/L (ref 22–32)
CREATININE: 0.59 mg/dL — AB (ref 0.61–1.24)
Glucose, Bld: 130 mg/dL — ABNORMAL HIGH (ref 70–99)
Potassium: 4 mmol/L (ref 3.5–5.1)
Sodium: 132 mmol/L — ABNORMAL LOW (ref 135–145)
Total Bilirubin: 0.7 mg/dL (ref 0.3–1.2)
Total Protein: 6.6 g/dL (ref 6.5–8.1)

## 2018-06-14 MED ORDER — SODIUM CHLORIDE 0.9 % IV SOLN
Freq: Once | INTRAVENOUS | Status: AC
  Administered 2018-06-14: 11:00:00 via INTRAVENOUS
  Filled 2018-06-14: qty 1000

## 2018-06-14 MED ORDER — SODIUM CHLORIDE 0.9% FLUSH
10.0000 mL | Freq: Once | INTRAVENOUS | Status: DC
  Filled 2018-06-14: qty 10

## 2018-06-14 MED ORDER — FAMOTIDINE IN NACL 20-0.9 MG/50ML-% IV SOLN
20.0000 mg | Freq: Once | INTRAVENOUS | Status: AC
  Administered 2018-06-14: 20 mg via INTRAVENOUS
  Filled 2018-06-14: qty 50

## 2018-06-14 MED ORDER — HEPARIN SOD (PORK) LOCK FLUSH 100 UNIT/ML IV SOLN: 500.0000 [IU] | Freq: Once | INTRAVENOUS | Status: DC

## 2018-06-14 MED ORDER — SODIUM CHLORIDE 0.9 % IV SOLN
Freq: Once | INTRAVENOUS | Status: AC
  Administered 2018-06-14: 10:00:00 via INTRAVENOUS
  Filled 2018-06-14: qty 1000

## 2018-06-14 MED ORDER — LORAZEPAM 2 MG/ML IJ SOLN
0.5000 mg | Freq: Once | INTRAMUSCULAR | Status: AC
  Administered 2018-06-14: 0.5 mg via INTRAVENOUS
  Filled 2018-06-14: qty 1

## 2018-06-14 MED ORDER — PALONOSETRON HCL INJECTION 0.25 MG/5ML
0.2500 mg | Freq: Once | INTRAVENOUS | Status: AC
  Administered 2018-06-14: 0.25 mg via INTRAVENOUS
  Filled 2018-06-14: qty 5

## 2018-06-14 MED ORDER — DIPHENHYDRAMINE HCL 50 MG/ML IJ SOLN
25.0000 mg | Freq: Once | INTRAMUSCULAR | Status: AC
  Administered 2018-06-14: 25 mg via INTRAVENOUS
  Filled 2018-06-14: qty 1

## 2018-06-14 MED ORDER — HEPARIN SOD (PORK) LOCK FLUSH 100 UNIT/ML IV SOLN
500.0000 [IU] | Freq: Once | INTRAVENOUS | Status: AC | PRN
  Administered 2018-06-14: 500 [IU]
  Filled 2018-06-14: qty 5

## 2018-06-14 MED ORDER — SODIUM CHLORIDE 0.9 % IV SOLN
299.4000 mg | Freq: Once | INTRAVENOUS | Status: AC
  Administered 2018-06-14: 300 mg via INTRAVENOUS
  Filled 2018-06-14: qty 30

## 2018-06-14 NOTE — Progress Notes (Signed)
No complaints today.

## 2018-06-15 ENCOUNTER — Ambulatory Visit
Admission: RE | Admit: 2018-06-15 | Discharge: 2018-06-15 | Disposition: A | Discharge status: Home / Self Care | Payer: BLUE CROSS/BLUE SHIELD | Source: Ambulatory Visit | Attending: Radiation Oncology | Admitting: Radiation Oncology

## 2018-06-15 ENCOUNTER — Encounter: Payer: Self-pay | Admitting: *Deleted

## 2018-06-15 DIAGNOSIS — C3491 Malignant neoplasm of unspecified part of right bronchus or lung: Secondary | ICD-10-CM | POA: Diagnosis not present

## 2018-06-16 ENCOUNTER — Encounter: Payer: Self-pay | Admitting: *Deleted

## 2018-06-16 ENCOUNTER — Ambulatory Visit
Admission: RE | Admit: 2018-06-16 | Discharge: 2018-06-16 | Disposition: A | Discharge status: Home / Self Care | Payer: BLUE CROSS/BLUE SHIELD | Source: Ambulatory Visit | Attending: Radiation Oncology | Admitting: Radiation Oncology

## 2018-06-16 DIAGNOSIS — C3491 Malignant neoplasm of unspecified part of right bronchus or lung: Secondary | ICD-10-CM | POA: Diagnosis not present

## 2018-06-18 ENCOUNTER — Ambulatory Visit: Admission: RE | Admit: 2018-06-18 | Payer: BLUE CROSS/BLUE SHIELD | Source: Ambulatory Visit

## 2018-06-19 ENCOUNTER — Ambulatory Visit
Admission: RE | Admit: 2018-06-19 | Discharge: 2018-06-19 | Disposition: A | Discharge status: Home / Self Care | Payer: BLUE CROSS/BLUE SHIELD | Source: Ambulatory Visit | Attending: Radiation Oncology | Admitting: Radiation Oncology

## 2018-06-19 ENCOUNTER — Ambulatory Visit: Payer: BLUE CROSS/BLUE SHIELD

## 2018-06-19 ENCOUNTER — Encounter: Payer: Self-pay | Admitting: *Deleted

## 2018-06-19 DIAGNOSIS — C3491 Malignant neoplasm of unspecified part of right bronchus or lung: Secondary | ICD-10-CM | POA: Diagnosis not present

## 2018-06-20 ENCOUNTER — Encounter: Payer: Self-pay | Admitting: *Deleted

## 2018-06-20 ENCOUNTER — Ambulatory Visit
Admission: RE | Admit: 2018-06-20 | Discharge: 2018-06-20 | Disposition: A | Discharge status: Home / Self Care | Payer: BLUE CROSS/BLUE SHIELD | Source: Ambulatory Visit | Attending: Radiation Oncology | Admitting: Radiation Oncology

## 2018-06-20 DIAGNOSIS — C3491 Malignant neoplasm of unspecified part of right bronchus or lung: Secondary | ICD-10-CM | POA: Diagnosis not present

## 2018-06-20 NOTE — Progress Notes (Signed)
[place]  Telephone:(336) [phone] Fax:(336) [phone]  ID: [name] OB: [birth year]  MR#: [id number]  TIR#:[id number]  Patient Care Team: Kirk Ruths, MD as PCP - General (Internal Medicine)  CHIEF COMPLAINT: Stage IIIa non-small cell lung cancer, right middle lobe.  INTERVAL HISTORY: Patient returns to clinic today for further evaluation and consideration of cycle 8 of single agent carboplatinum.  He is tolerating his treatments well without significant side effects.  He is also tolerating his daily XRT. He continues to be anxious. He continues to smoke heavily, but states he is cutting back. He has no neurologic complaints. He denies any recent fevers or illnesses.  He does not complain of dysphasia today.  He has no chest pain, cough, hemoptysis, or shortness of breath. He denies any nausea, vomiting, constipation, or diarrhea. He has no urinary complaints.  Patient offers no specific complaints today.  REVIEW OF SYSTEMS:   Review of Systems  Constitutional: Negative.  Negative for fever, malaise/fatigue and weight loss.  HENT: Negative.  Negative for sore throat.   Respiratory: Negative.  Negative for cough, hemoptysis and shortness of breath.   Cardiovascular: Negative.  Negative for chest pain and leg swelling.  Gastrointestinal: Negative.  Negative for abdominal pain, blood in stool and melena.  Genitourinary: Negative.  Negative for dysuria.  Musculoskeletal: Negative.  Negative for joint pain.  Skin: Negative.  Negative for rash.  Neurological: Negative.  Negative for sensory change, focal weakness, weakness and headaches.  Psychiatric/Behavioral: The patient is nervous/anxious.     As per HPI. Otherwise, a complete review of systems is negative.  PAST MEDICAL HISTORY: Past Medical History:  Diagnosis Date  . Anginal pain ([place])   . Anxiety   . Chest pain   . Chicken pox   . Complication of anesthesia    o2 dropped after neck fusion    . COPD (chronic obstructive pulmonary disease) ([place])   . Coronary artery disease   . Cough    chronic  clear phlegm  . Dysrhythmia    palpitations  . GERD (gastroesophageal reflux disease)    h/o reflux/ hoarsness  . Hematochezia   . Hemorrhoids   . History of chickenpox   . History of colon polyps   . History of Helicobacter pylori infection   . Hoarseness   . Hypertension   . Migraines   . OSA (obstructive sleep apnea)    has CPAP but does not use  . Personal history of tobacco use, presenting hazards to health 03/05/2016  . Pneumonia    5/17  . Raynaud disease   . Raynaud disease   . Raynaud's disease   . Rotator cuff tear    on right  . Shortness of breath dyspnea   . Sleep apnea   . Ulcer (traumatic) of oral mucosa     PAST SURGICAL HISTORY: Past Surgical History:  Procedure Laterality Date  . BACK SURGERY     cervical fusion x 2  . CARDIAC CATHETERIZATION    . CERVICAL DISCECTOMY     x 2  . COLONOSCOPY    . COLONOSCOPY N/A 07/25/2015   Procedure: COLONOSCOPY;  Surgeon: Lollie Sails, MD;  Location: [facility] ENDOSCOPY;  Service: Endoscopy;  Laterality: N/A;  . COLONOSCOPY WITH PROPOFOL N/A 10/04/2017   Procedure: COLONOSCOPY WITH PROPOFOL;  Surgeon: Lollie Sails, MD;  Location: [facility] ENDOSCOPY;  Service: Endoscopy;  Laterality: N/A;  . ELECTROMAGNETIC NAVIGATION BROCHOSCOPY Left 06/28/2016   Procedure: ELECTROMAGNETIC NAVIGATION BRONCHOSCOPY;  Surgeon:  Vilinda Boehringer, MD;  Location: [facility] ORS;  Service: Cardiopulmonary;  Laterality: Left;  . ENDOBRONCHIAL ULTRASOUND N/A 04/11/2018   Procedure: ENDOBRONCHIAL ULTRASOUND;  Surgeon: [name], MD;  Location: [facility] ORS;  Service: Cardiopulmonary;  Laterality: N/A;  . ESOPHAGOGASTRODUODENOSCOPY N/A 07/25/2015   Procedure: ESOPHAGOGASTRODUODENOSCOPY (EGD);  Surgeon: Lollie Sails, MD;  Location: [facility] ENDOSCOPY;  Service: Endoscopy;  Laterality: N/A;  . NASAL SINUS SURGERY     x 2   . PORTA CATH INSERTION N/A  04/24/2018   Procedure: PORTA CATH INSERTION;  Surgeon: Algernon Huxley, MD;  Location: [place] CV LAB;  Service: Cardiovascular;  Laterality: N/A;  . rotator cuff surgery Right    07/2016  . SEPTOPLASTY    . SKIN GRAFT      FAMILY HISTORY Family History  Problem Relation Age of Onset  . Heart disease Father   . Prostate cancer Father   . Heart disease Paternal Grandmother   . Heart attack Maternal Grandfather 52  . Kidney cancer Neg Hx   . Bladder Cancer Neg Hx   . Other Neg Hx        pituitary abnormality       ADVANCED DIRECTIVES:    HEALTH MAINTENANCE: Social History   Tobacco Use  . Smoking status: Current Every Day Smoker    Packs/day: 1.50    Years: 45.00    Pack years: 67.50    Types: Cigarettes  . Smokeless tobacco: Never Used  Substance Use Topics  . Alcohol use: Yes    Alcohol/week: 2.0 standard drinks    Types: 2 Standard drinks or equivalent per week    Comment: moderate  . Drug use: No     Allergies  Allergen Reactions  . Lisinopril Rash  . Varenicline Rash    Current Outpatient Medications  Medication Sig Dispense Refill  . acetaminophen (TYLENOL) 500 MG tablet Take 1,000 mg by mouth every 6 (six) hours as needed (for pain.).    Marland Kitchen albuterol (PROAIR HFA) 108 (90 Base) MCG/ACT inhaler Inhale 2 puffs into the lungs every 6 (six) hours as needed for wheezing or shortness of breath. 1 Inhaler 3  . atorvastatin (LIPITOR) 40 MG tablet Take 40 mg by mouth at bedtime.     Marland Kitchen BREO ELLIPTA 200-25 MCG/INH AEPB TAKE 1 PUFF BY MOUTH EVERY DAY 60 each 6  . busPIRone (BUSPAR) 15 MG tablet Take 15 mg by mouth 2 (two) times daily.   10  . diazepam (VALIUM) 5 MG tablet Take 5 mg by mouth every 12 (twelve) hours as needed for anxiety (SCHEDULED EVERY NIGHT).     . DULoxetine (CYMBALTA) 60 MG capsule Take 60 mg by mouth daily.     . fluticasone (FLONASE) 50 MCG/ACT nasal spray Place 1 spray into both nostrils daily as needed (for allergies.).     Marland Kitchen  ipratropium-albuterol (DUONEB) 0.5-2.5 (3) MG/3ML SOLN Take 3 mLs by nebulization every 6 (six) hours. (Patient taking differently: Take 3 mLs by nebulization every 6 (six) hours as needed. ) 360 mL 5  . lidocaine-prilocaine (EMLA) cream Apply to affected area once 30 g 3  . magic mouthwash w/lidocaine SOLN Take 5 mLs by mouth 3 (three) times daily as needed for mouth pain.    . metoprolol tartrate (LOPRESSOR) 50 MG tablet Take 50 mg by mouth 2 (two) times daily.    . mirabegron ER (MYRBETRIQ) 50 MG TB24 tablet Take 1 tablet (50 mg total) by mouth daily. 90 tablet 3  . Multiple Vitamin (  MULTIVITAMIN WITH MINERALS) TABS tablet Take 1 tablet by mouth daily.     . nicotine (NICODERM CQ - DOSED IN MG/24 HOURS) 21 mg/24hr patch Place 1 patch (21 mg total) onto the skin daily. 30 patch 12  . nitroGLYCERIN (NITROSTAT) 0.4 MG SL tablet Place 0.4 mg under the tongue every 5 (five) minutes as needed for chest pain.     Marland Kitchen ondansetron (ZOFRAN) 8 MG tablet Take 1 tablet (8 mg total) by mouth 2 (two) times daily as needed for refractory nausea / vomiting. (Patient not taking: Reported on 05/24/2018) 30 tablet 2  . OXYGEN Inhale 2 L into the lungs at bedtime.     . pantoprazole (PROTONIX) 40 MG tablet Take 40 mg by mouth daily.  3  . potassium chloride (K-DUR,KLOR-CON) 10 MEQ tablet Take 10 mEq by mouth daily.    . prochlorperazine (COMPAZINE) 10 MG tablet Take 1 tablet (10 mg total) by mouth every 6 (six) hours as needed (Nausea or vomiting). (Patient not taking: Reported on 05/24/2018) 60 tablet 2  . SPIRIVA RESPIMAT 2.5 MCG/ACT AERS INHALE 2 PUFFS INTO THE LUNGS DAILY. 4 g 6  . sucralfate (CARAFATE) 1 g tablet Take 1 tablet (1 g total) by mouth 3 (three) times daily before meals. Dissolve in warm water, swish and swallow 90 tablet 6   No current facility-administered medications for this visit.     OBJECTIVE: There were no vitals filed for this visit.   There is no height or weight on file to calculate BMI.     ECOG FS:0 - Asymptomatic  General: Well-developed, well-nourished, no acute distress. Eyes: Pink conjunctiva, anicteric sclera. HEENT: Normocephalic, moist mucous membranes. Lungs: Clear to auscultation bilaterally. Heart: Regular rate and rhythm. No rubs, murmurs, or gallops. Abdomen: Soft, nontender, nondistended. No organomegaly noted, normoactive bowel sounds. Musculoskeletal: No edema, cyanosis, or clubbing. Neuro: Alert, answering all questions appropriately. Cranial nerves grossly intact. Skin: No rashes or petechiae noted. Psych: Normal affect.  LAB RESULTS:  Lab Results  Component Value Date   NA 132 (L) 06/21/2018   K 4.0 06/21/2018   CL 99 06/21/2018   CO2 26 06/21/2018   GLUCOSE 93 06/21/2018   BUN 11 06/21/2018   CREATININE 0.68 06/21/2018   CALCIUM 9.2 06/21/2018   PROT 7.0 06/21/2018   ALBUMIN 3.9 06/21/2018   AST 21 06/21/2018   ALT 20 06/21/2018   ALKPHOS 86 06/21/2018   BILITOT 0.8 06/21/2018   GFRNONAA >60 06/21/2018   GFRAA >60 06/21/2018    Lab Results  Component Value Date   WBC 4.7 06/21/2018   NEUTROABS 3.6 06/21/2018   HGB 14.1 06/21/2018   HCT 38.5 (L) 06/21/2018   MCV 99.1 06/21/2018   PLT 222 06/21/2018     STUDIES:  ASSESSMENT: Stage IIIa non-small cell lung cancer, right middle lobe.  PLAN:    1. Stage IIIa non-small cell lung cancer, right middle lobe: Case discussed with pathologist and unable to determine whether this is adenocarcinoma or squamous carcinoma.  There is also insufficient tissue to do further testing.  Liquid biopsy did not reveal any actionable mutations. PET scan results from Mar 16, 2018 reviewed independently confirming stage of disease.  MRI of the brain completed on Mar 28, 2018 reviewed independently did not reveal metastatic disease.  Continue daily XRT completing on June 26, 2018.  Patient had a reaction to Taxol during cycle 1, therefore this is was discontinued.  Proceed with cycle 8 of weekly single agent  carboplatin today.  Return to clinic in 3 weeks for further evaluation and initiation of maintenance Imfinzi.  Will repeat PET scan 1 to 2 days prior to his next clinic visit.  2.  Secondary polycythemia: Resolved.  Likely secondary to heavy tobacco use.  Previously, the remainder of his laboratory work, including JAK-2 mutation, was either negative or within normal limits.  3. Left upper lobe pulmonary nodule: Imaging results as above.  Previously, biopsy completed on October 04, 2017 was negative for malignancy.  Repeat PET scan as above. 4.  Colon polyps:  Patient has a personal history of greater then 10 adenomatous polyps on his most recent conoloscopy. He does not know of any family history of increased polyps or colon cancer.  Genetic testing to assess for the APC mutation for FAP or AFAP was negative. Continue colonoscopies as per GI. 5. Tobacco Use: Patient continues to heavily smoke, but has decreased significantly.  He expressed understanding by continue to smokeless increases his chance of recurrence. 6. Anxiety: Chronic.  Continue IV Ativan with each treatment.  Continue treatment and evaluation per primary care. 7.  Hyponatremia: Chronic and unchanged.  Patient's sodium level remains unchanged on 132.  Patient expressed understanding and was in agreement with this plan. He also understands that He can call clinic at any time with any questions, concerns, or complaints.    [name], MD   06/23/2018 3:51 PM

## 2018-06-21 ENCOUNTER — Encounter: Payer: Self-pay | Admitting: *Deleted

## 2018-06-21 ENCOUNTER — Inpatient Hospital Stay (HOSPITAL_BASED_OUTPATIENT_CLINIC_OR_DEPARTMENT_OTHER): Payer: BLUE CROSS/BLUE SHIELD | Admitting: Oncology

## 2018-06-21 ENCOUNTER — Inpatient Hospital Stay: Payer: BLUE CROSS/BLUE SHIELD

## 2018-06-21 ENCOUNTER — Ambulatory Visit
Admission: RE | Admit: 2018-06-21 | Discharge: 2018-06-21 | Disposition: A | Discharge status: Home / Self Care | Payer: BLUE CROSS/BLUE SHIELD | Source: Ambulatory Visit | Attending: Radiation Oncology | Admitting: Radiation Oncology

## 2018-06-21 DIAGNOSIS — K219 Gastro-esophageal reflux disease without esophagitis: Secondary | ICD-10-CM

## 2018-06-21 DIAGNOSIS — F17210 Nicotine dependence, cigarettes, uncomplicated: Secondary | ICD-10-CM

## 2018-06-21 DIAGNOSIS — C3491 Malignant neoplasm of unspecified part of right bronchus or lung: Secondary | ICD-10-CM

## 2018-06-21 DIAGNOSIS — C342 Malignant neoplasm of middle lobe, bronchus or lung: Secondary | ICD-10-CM

## 2018-06-21 DIAGNOSIS — E871 Hypo-osmolality and hyponatremia: Secondary | ICD-10-CM | POA: Diagnosis not present

## 2018-06-21 DIAGNOSIS — J449 Chronic obstructive pulmonary disease, unspecified: Secondary | ICD-10-CM

## 2018-06-21 DIAGNOSIS — D696 Thrombocytopenia, unspecified: Secondary | ICD-10-CM

## 2018-06-21 DIAGNOSIS — I2510 Atherosclerotic heart disease of native coronary artery without angina pectoris: Secondary | ICD-10-CM

## 2018-06-21 DIAGNOSIS — G4733 Obstructive sleep apnea (adult) (pediatric): Secondary | ICD-10-CM

## 2018-06-21 DIAGNOSIS — Z79899 Other long term (current) drug therapy: Secondary | ICD-10-CM

## 2018-06-21 DIAGNOSIS — I10 Essential (primary) hypertension: Secondary | ICD-10-CM

## 2018-06-21 DIAGNOSIS — I7300 Raynaud's syndrome without gangrene: Secondary | ICD-10-CM

## 2018-06-21 DIAGNOSIS — Z8042 Family history of malignant neoplasm of prostate: Secondary | ICD-10-CM

## 2018-06-21 DIAGNOSIS — F419 Anxiety disorder, unspecified: Secondary | ICD-10-CM

## 2018-06-21 DIAGNOSIS — Z86010 Personal history of colonic polyps: Secondary | ICD-10-CM

## 2018-06-21 DIAGNOSIS — Z9981 Dependence on supplemental oxygen: Secondary | ICD-10-CM

## 2018-06-21 LAB — COMPREHENSIVE METABOLIC PANEL
ALT: 20 U/L (ref 0–44)
ANION GAP: 7 (ref 5–15)
AST: 21 U/L (ref 15–41)
Albumin: 3.9 g/dL (ref 3.5–5.0)
Alkaline Phosphatase: 86 U/L (ref 38–126)
BUN: 11 mg/dL (ref 8–23)
CHLORIDE: 99 mmol/L (ref 98–111)
CO2: 26 mmol/L (ref 22–32)
Calcium: 9.2 mg/dL (ref 8.9–10.3)
Creatinine, Ser: 0.68 mg/dL (ref 0.61–1.24)
GFR calc Af Amer: >60 mL/min (ref >60)
Glucose, Bld: 93 mg/dL (ref 70–99)
POTASSIUM: 4 mmol/L (ref 3.5–5.1)
SODIUM: 132 mmol/L — AB (ref 135–145)
Total Bilirubin: 0.8 mg/dL (ref 0.3–1.2)
Total Protein: 7 g/dL (ref 6.5–8.1)

## 2018-06-21 LAB — CBC WITH DIFFERENTIAL/PLATELET
Basophils Absolute: 0 10*3/uL (ref 0–0.1)
Basophils Relative: 0 %
Eosinophils Absolute: 0 10*3/uL (ref 0–0.7)
Eosinophils Relative: 1 %
HCT: 38.5 % — ABNORMAL LOW (ref 40.0–52.0)
Hemoglobin: 14.1 g/dL (ref 13.0–18.0)
Lymphocytes Relative: 11 %
Lymphs Abs: 0.5 10*3/uL — ABNORMAL LOW (ref 1.0–3.6)
MCH: 36.3 pg — ABNORMAL HIGH (ref 26.0–34.0)
MCHC: 36.6 g/dL — ABNORMAL HIGH (ref 32.0–36.0)
MCV: 99.1 fL (ref 80.0–100.0)
Monocytes Absolute: 0.5 10*3/uL (ref 0.2–1.0)
Monocytes Relative: 12 %
Neutro Abs: 3.6 10*3/uL (ref 1.4–6.5)
Neutrophils Relative %: 76 %
Platelets: 222 10*3/uL (ref 150–440)
RBC: 3.89 MIL/uL — ABNORMAL LOW (ref 4.40–5.90)
RDW: 15 % — ABNORMAL HIGH (ref 11.5–14.5)
WBC: 4.7 10*3/uL (ref 3.8–10.6)

## 2018-06-21 MED ORDER — FAMOTIDINE IN NACL 20-0.9 MG/50ML-% IV SOLN: 20.0000 mg | Freq: Once | INTRAVENOUS | Status: DC

## 2018-06-21 MED ORDER — DIPHENHYDRAMINE HCL 50 MG/ML IJ SOLN
25.0000 mg | Freq: Once | INTRAMUSCULAR | Status: AC
  Administered 2018-06-21: 25 mg via INTRAVENOUS
  Filled 2018-06-21: qty 1

## 2018-06-21 MED ORDER — HEPARIN SOD (PORK) LOCK FLUSH 100 UNIT/ML IV SOLN
500.0000 [IU] | Freq: Once | INTRAVENOUS | Status: AC
  Administered 2018-06-21: 500 [IU] via INTRAVENOUS
  Filled 2018-06-21: qty 5

## 2018-06-21 MED ORDER — PALONOSETRON HCL INJECTION 0.25 MG/5ML
0.2500 mg | Freq: Once | INTRAVENOUS | Status: AC
  Administered 2018-06-21: 0.25 mg via INTRAVENOUS
  Filled 2018-06-21: qty 5

## 2018-06-21 MED ORDER — SODIUM CHLORIDE 0.9 % IV SOLN
300.0000 mg | Freq: Once | INTRAVENOUS | Status: AC
  Administered 2018-06-21: 300 mg via INTRAVENOUS
  Filled 2018-06-21: qty 30

## 2018-06-21 MED ORDER — SODIUM CHLORIDE 0.9 % IV SOLN
Freq: Once | INTRAVENOUS | Status: AC
  Administered 2018-06-21: 10:00:00 via INTRAVENOUS
  Filled 2018-06-21: qty 250

## 2018-06-21 MED ORDER — SODIUM CHLORIDE 0.9 % IV SOLN: 10.0000 mg | Freq: Once | INTRAVENOUS | Status: DC

## 2018-06-21 MED ORDER — FAMOTIDINE IN NACL 20-0.9 MG/50ML-% IV SOLN
20.0000 mg | Freq: Two times a day (BID) | INTRAVENOUS | Status: DC
  Administered 2018-06-21: 20 mg via INTRAVENOUS
  Filled 2018-06-21 (×2): qty 50

## 2018-06-21 MED ORDER — DEXAMETHASONE SODIUM PHOSPHATE 10 MG/ML IJ SOLN
10.0000 mg | Freq: Once | INTRAMUSCULAR | Status: AC
  Administered 2018-06-21: 10 mg via INTRAVENOUS
  Filled 2018-06-21: qty 1

## 2018-06-21 MED ORDER — SODIUM CHLORIDE 0.9% FLUSH
10.0000 mL | Freq: Once | INTRAVENOUS | Status: AC
  Administered 2018-06-21: 10 mL via INTRAVENOUS
  Filled 2018-06-21: qty 10

## 2018-06-21 MED ORDER — LORAZEPAM 2 MG/ML IJ SOLN
0.5000 mg | Freq: Once | INTRAMUSCULAR | Status: AC
  Administered 2018-06-21: 0.5 mg via INTRAVENOUS
  Filled 2018-06-21: qty 1

## 2018-06-21 NOTE — Progress Notes (Signed)
MD wants dex, lorazepam, pepcid, aloxi and benadryl ordered with [facility].

## 2018-06-22 ENCOUNTER — Encounter: Payer: Self-pay | Admitting: *Deleted

## 2018-06-22 ENCOUNTER — Ambulatory Visit
Admission: RE | Admit: 2018-06-22 | Discharge: 2018-06-22 | Disposition: A | Discharge status: Home / Self Care | Payer: BLUE CROSS/BLUE SHIELD | Source: Ambulatory Visit | Attending: Radiation Oncology | Admitting: Radiation Oncology

## 2018-06-22 DIAGNOSIS — C3491 Malignant neoplasm of unspecified part of right bronchus or lung: Secondary | ICD-10-CM | POA: Diagnosis not present

## 2018-06-23 ENCOUNTER — Ambulatory Visit
Admission: RE | Admit: 2018-06-23 | Discharge: 2018-06-23 | Disposition: A | Discharge status: Home / Self Care | Payer: BLUE CROSS/BLUE SHIELD | Source: Ambulatory Visit | Attending: Radiation Oncology | Admitting: Radiation Oncology

## 2018-06-23 ENCOUNTER — Encounter: Payer: Self-pay | Admitting: *Deleted

## 2018-06-23 DIAGNOSIS — C3491 Malignant neoplasm of unspecified part of right bronchus or lung: Secondary | ICD-10-CM | POA: Diagnosis not present

## 2018-06-26 ENCOUNTER — Ambulatory Visit
Admission: RE | Admit: 2018-06-26 | Discharge: 2018-06-26 | Disposition: A | Discharge status: Home / Self Care | Payer: BLUE CROSS/BLUE SHIELD | Source: Ambulatory Visit | Attending: Radiation Oncology | Admitting: Radiation Oncology

## 2018-06-26 ENCOUNTER — Encounter: Payer: Self-pay | Admitting: *Deleted

## 2018-06-26 DIAGNOSIS — C3491 Malignant neoplasm of unspecified part of right bronchus or lung: Secondary | ICD-10-CM | POA: Diagnosis not present

## 2018-06-29 ENCOUNTER — Telehealth: Payer: Self-pay | Admitting: *Deleted

## 2018-06-29 MED ORDER — LEVOFLOXACIN 500 MG PO TABS: 500.0000 mg | ORAL_TABLET | Freq: Every day | ORAL | 0 refills | Status: DC

## 2018-06-29 NOTE — Telephone Encounter (Signed)
Pt's wife called in to report pt started having chest congestion, productive cough, and low grade fever. States that has been treated with antibiotic and steroid taper in the past. Per Dr. Grayland Ormond, will send in prescription for levaquin 500mg  x 7 days. Pt instructed to call back if not better.

## 2018-06-30 ENCOUNTER — Telehealth: Payer: Self-pay | Admitting: *Deleted

## 2018-06-30 ENCOUNTER — Other Ambulatory Visit: Payer: Self-pay | Admitting: Oncology

## 2018-06-30 MED ORDER — AMOXICILLIN 500 MG PO CAPS: 500.0000 mg | ORAL_CAPSULE | Freq: Three times a day (TID) | ORAL | 0 refills | Status: DC

## 2018-06-30 NOTE — Telephone Encounter (Signed)
Pt's wife called in to report that pt has been around grandson that tested positive for strep today. Pt's wife is asking if pt needs to switch antibiotic in case pt has developed strep from recent exposure. Spoke with Sonia Baller, NP who advised pt start taking amoxicillin 500mg  TID x 10 days and stop levaquin. Rx escribed to pharmacy. Pt's wife made aware to stop levaquin and start amoxicillin. Advised to call back if not better or symptoms worsen.

## 2018-07-09 NOTE — Progress Notes (Signed)
[place]  [phone]) [phone] Fax:(336) [phone]  ID: Paul Carpenter OB: [birth year]  MR#: [id number]  VPX#:[id number]  Patient Care Team: Kirk Ruths, MD as PCP - General (Internal Medicine)  CHIEF COMPLAINT: Stage IIIa non-small cell lung cancer, right middle lobe.  INTERVAL HISTORY: Patient returns to clinic today for further evaluation, discussion of his imaging results, and consideration of cycle 1 of maintenance Imfinzi.  He continues to be anxious, but otherwise feels well. He has no neurologic complaints. He denies any recent fevers or illnesses.  He does not complain of dysphasia today.  He has no chest pain, cough, hemoptysis, or shortness of breath. He denies any nausea, vomiting, constipation, or diarrhea. He has no urinary complaints.  Patient offers no further specific complaints today.  REVIEW OF SYSTEMS:   Review of Systems  Constitutional: Negative.  Negative for fever, malaise/fatigue and weight loss.  HENT: Negative.  Negative for sore throat.   Respiratory: Negative.  Negative for cough, hemoptysis and shortness of breath.   Cardiovascular: Negative.  Negative for chest pain and leg swelling.  Gastrointestinal: Negative.  Negative for abdominal pain, blood in stool and melena.  Genitourinary: Negative.  Negative for dysuria.  Musculoskeletal: Negative.  Negative for joint pain.  Skin: Negative.  Negative for rash.  Neurological: Negative.  Negative for sensory change, focal weakness, weakness and headaches.  Psychiatric/Behavioral: The patient is nervous/anxious.     As per HPI. Otherwise, a complete review of systems is negative.  PAST MEDICAL HISTORY: Past Medical History:  Diagnosis Date  . Anginal pain ([employer])   . Anxiety   . Chest pain   . Chicken pox   . Complication of anesthesia    o2 dropped after neck fusion  . COPD (chronic obstructive pulmonary disease) ([place])   . Coronary artery disease   . Cough    chronic   clear phlegm  . Dysrhythmia    palpitations  . GERD (gastroesophageal reflux disease)    h/o reflux/ hoarsness  . Hematochezia   . Hemorrhoids   . History of chickenpox   . History of colon polyps   . History of Helicobacter pylori infection   . Hoarseness   . Hypertension   . Lung cancer ([place]) 05/2016   Chemo + rad tx's.   . Migraines   . OSA (obstructive sleep apnea)    has CPAP but does not use  . Personal history of tobacco use, presenting hazards to health 03/05/2016  . Pneumonia    5/17  . Raynaud disease   . Raynaud disease   . Raynaud's disease   . Rotator cuff tear    on right  . Shortness of breath dyspnea   . Sleep apnea   . Ulcer (traumatic) of oral mucosa     PAST SURGICAL HISTORY: Past Surgical History:  Procedure Laterality Date  . BACK SURGERY     cervical fusion x 2  . CARDIAC CATHETERIZATION    . CERVICAL DISCECTOMY     x 2  . COLONOSCOPY    . COLONOSCOPY N/A 07/25/2015   Procedure: COLONOSCOPY;  Surgeon: Lollie Sails, MD;  Location: [facility] ENDOSCOPY;  Service: Endoscopy;  Laterality: N/A;  . COLONOSCOPY WITH PROPOFOL N/A 10/04/2017   Procedure: COLONOSCOPY WITH PROPOFOL;  Surgeon: Lollie Sails, MD;  Location: [facility] ENDOSCOPY;  Service: Endoscopy;  Laterality: N/A;  . ELECTROMAGNETIC NAVIGATION BROCHOSCOPY Left 06/28/2016   Procedure: ELECTROMAGNETIC NAVIGATION BRONCHOSCOPY;  Surgeon: Vilinda Boehringer, MD;  Location: [facility] ORS;  Service: Cardiopulmonary;  Laterality: Left;  . ENDOBRONCHIAL ULTRASOUND N/A 04/11/2018   Procedure: ENDOBRONCHIAL ULTRASOUND;  Surgeon: [name], MD;  Location: [facility] ORS;  Service: Cardiopulmonary;  Laterality: N/A;  . ESOPHAGOGASTRODUODENOSCOPY N/A 07/25/2015   Procedure: ESOPHAGOGASTRODUODENOSCOPY (EGD);  Surgeon: Lollie Sails, MD;  Location: [facility] ENDOSCOPY;  Service: Endoscopy;  Laterality: N/A;  . NASAL SINUS SURGERY     x 2   . PORTA CATH INSERTION N/A 04/24/2018   Procedure: PORTA CATH INSERTION;  Surgeon: Algernon Huxley, MD;  Location: [facility] CV LAB;  Service: Cardiovascular;  Laterality: N/A;  . rotator cuff surgery Right    07/2016  . SEPTOPLASTY    . SKIN GRAFT      FAMILY HISTORY Family History  Problem Relation Age of Onset  . Heart disease Father   . Prostate cancer Father   . Heart disease Paternal Grandmother   . Heart attack Maternal Grandfather 52  . Kidney cancer Neg Hx   . Bladder Cancer Neg Hx   . Other Neg Hx        pituitary abnormality       ADVANCED DIRECTIVES:    HEALTH MAINTENANCE: Social History   Tobacco Use  . Smoking status: Current Every Day Smoker    Packs/day: 1.50    Years: 45.00    Pack years: 67.50    Types: Cigarettes  . Smokeless tobacco: Never Used  Substance Use Topics  . Alcohol use: Yes    Alcohol/week: 2.0 standard drinks    Types: 2 Standard drinks or equivalent per week    Comment: moderate  . Drug use: No     Allergies  Allergen Reactions  . Lisinopril Rash  . Varenicline Rash    Current Outpatient Medications  Medication Sig Dispense Refill  . acetaminophen (TYLENOL) 500 MG tablet Take 1,000 mg by mouth every 6 (six) hours as needed (for pain.).    Marland Kitchen albuterol (PROAIR HFA) 108 (90 Base) MCG/ACT inhaler Inhale 2 puffs into the lungs every 6 (six) hours as needed for wheezing or shortness of breath. 1 Inhaler 3  . atorvastatin (LIPITOR) 40 MG tablet Take 40 mg by mouth at bedtime.     Marland Kitchen BREO ELLIPTA 200-25 MCG/INH AEPB TAKE 1 PUFF BY MOUTH EVERY DAY 60 each 6  . busPIRone (BUSPAR) 15 MG tablet Take 15 mg by mouth 2 (two) times daily.   10  . diazepam (VALIUM) 5 MG tablet Take 5 mg by mouth every 12 (twelve) hours as needed for anxiety (SCHEDULED EVERY NIGHT).     . DULoxetine (CYMBALTA) 60 MG capsule Take 60 mg by mouth daily.     . fluticasone (FLONASE) 50 MCG/ACT nasal spray Place 1 spray into both nostrils daily as needed (for allergies.).     Marland Kitchen ipratropium-albuterol (DUONEB) 0.5-2.5 (3) MG/3ML SOLN Take 3 mLs by  nebulization every 6 (six) hours. (Patient taking differently: Take 3 mLs by nebulization every 6 (six) hours as needed. ) 360 mL 5  . magic mouthwash w/lidocaine SOLN Take 5 mLs by mouth 3 (three) times daily as needed for mouth pain.    . metoprolol tartrate (LOPRESSOR) 50 MG tablet Take 50 mg by mouth 2 (two) times daily.    . mirabegron ER (MYRBETRIQ) 50 MG TB24 tablet Take 1 tablet (50 mg total) by mouth daily. 90 tablet 3  . Multiple Vitamin (MULTIVITAMIN WITH MINERALS) TABS tablet Take 1 tablet by mouth daily.     . nicotine (NICODERM CQ - DOSED  IN MG/24 HOURS) 21 mg/24hr patch Place 1 patch (21 mg total) onto the skin daily. 30 patch 12  . OXYGEN Inhale 2 L into the lungs at bedtime.     . pantoprazole (PROTONIX) 40 MG tablet Take 40 mg by mouth daily.  3  . SPIRIVA RESPIMAT 2.5 MCG/ACT AERS INHALE 2 PUFFS INTO THE LUNGS DAILY. 4 g 6  . sucralfate (CARAFATE) 1 g tablet Take 1 tablet (1 g total) by mouth 3 (three) times daily before meals. Dissolve in warm water, swish and swallow 90 tablet 6  . nitroGLYCERIN (NITROSTAT) 0.4 MG SL tablet Place 0.4 mg under the tongue every 5 (five) minutes as needed for chest pain.     . potassium chloride (K-DUR,KLOR-CON) 10 MEQ tablet Take 10 mEq by mouth daily.     No current facility-administered medications for this visit.     OBJECTIVE: Vitals:   07/12/18 0942  BP: 115/75  Pulse: 77  Resp: 18  Temp: (!) 97.4 F (36.3 C)     Body mass index is 27.56 kg/m.    ECOG FS:0 - Asymptomatic  General: Well-developed, well-nourished, no acute distress. Eyes: Pink conjunctiva, anicteric sclera. HEENT: Normocephalic, moist mucous membranes. Lungs: Clear to auscultation bilaterally. Heart: Regular rate and rhythm. No rubs, murmurs, or gallops. Abdomen: Soft, nontender, nondistended. No organomegaly noted, normoactive bowel sounds. Musculoskeletal: No edema, cyanosis, or clubbing. Neuro: Alert, answering all questions appropriately. Cranial nerves  grossly intact. Skin: No rashes or petechiae noted. Psych: Normal affect.  LAB RESULTS:  Lab Results  Component Value Date   NA 132 (L) 07/12/2018   K 3.9 07/12/2018   CL 99 07/12/2018   CO2 24 07/12/2018   GLUCOSE 124 (H) 07/12/2018   BUN 9 07/12/2018   CREATININE 0.70 07/12/2018   CALCIUM 8.7 (L) 07/12/2018   PROT 6.5 07/12/2018   ALBUMIN 3.8 07/12/2018   AST 23 07/12/2018   ALT 19 07/12/2018   ALKPHOS 80 07/12/2018   BILITOT 0.7 07/12/2018   GFRNONAA >60 07/12/2018   GFRAA >60 07/12/2018    Lab Results  Component Value Date   WBC 5.8 07/12/2018   NEUTROABS 4.7 07/12/2018   HGB 13.5 07/12/2018   HCT 37.9 (L) 07/12/2018   MCV 102.7 (H) 07/12/2018   PLT 122 (L) 07/12/2018     STUDIES:  ASSESSMENT: Stage IIIa non-small cell lung cancer, right middle lobe.  PLAN:    1. Stage IIIa non-small cell lung cancer, right middle lobe: Case discussed with pathologist and unable to determine whether this is adenocarcinoma or squamous carcinoma.  There is also insufficient tissue to do further testing.  Liquid biopsy did not reveal any actionable mutations.  PET scan was denied by insurance, therefore proceeded with CT scan on July 11, 2018.  Results reviewed independently revealing a progressive nodule in the medial left upper lobe is suspicious but not definitive of malignancy.  The remainder of his known disease is grossly unchanged. MRI of the brain completed on Mar 28, 2018 reviewed independently did not reveal metastatic disease.  Patient completed XRT June 26, 2018.  He completed his concurrent single agent carboplatinum on June 21, 2018.  Patient had a reaction to Taxol during cycle 1 and this was discontinued.  Proceed with cycle 1 of maintenance Imfinzi today.  We will continue treatment every 2 weeks for a total of 12 months or until progression of disease.  Return to clinic in 2 weeks for further evaluation and consideration of cycle 2.   2.  Secondary polycythemia:  Resolved.  Likely secondary to heavy tobacco use.  Previously, the remainder of his laboratory work, including JAK-2 mutation, was either negative or within normal limits.  3. Left upper lobe pulmonary nodule: Imaging results as above.  Previously, biopsy completed on October 04, 2017 was negative for malignancy.  Will discuss with radiation oncology if additional XRT is warranted. 4.  Colon polyps:  Patient has a personal history of greater then 10 adenomatous polyps on his most recent conoloscopy. He does not know of any family history of increased polyps or colon cancer.  Genetic testing to assess for the APC mutation for FAP or AFAP was negative. Continue colonoscopies as per GI. 5. Tobacco Use: Patient continues to heavily smoke.  He expressed understanding by continue to smokeless increases his chance of recurrence. 6. Anxiety: Chronic.  Continue IV Benadryl with each treatment.  Continue treatment and evaluation per primary care. 7.  Hyponatremia: Sodium is 132 today.  Chronic and unchanged.    Patient expressed understanding and was in agreement with this plan. He also understands that He can call clinic at any time with any questions, concerns, or complaints.    [name], MD   07/13/2018 9:10 AM

## 2018-07-10 ENCOUNTER — Telehealth: Payer: Self-pay | Admitting: *Deleted

## 2018-07-10 ENCOUNTER — Other Ambulatory Visit: Payer: BLUE CROSS/BLUE SHIELD

## 2018-07-10 ENCOUNTER — Other Ambulatory Visit: Payer: Self-pay | Admitting: Oncology

## 2018-07-10 DIAGNOSIS — C3490 Malignant neoplasm of unspecified part of unspecified bronchus or lung: Secondary | ICD-10-CM

## 2018-07-10 DIAGNOSIS — R059 Cough, unspecified: Secondary | ICD-10-CM

## 2018-07-10 DIAGNOSIS — C3491 Malignant neoplasm of unspecified part of right bronchus or lung: Secondary | ICD-10-CM

## 2018-07-10 DIAGNOSIS — R05 Cough: Secondary | ICD-10-CM

## 2018-07-10 NOTE — Progress Notes (Signed)
DISCONTINUE ON PATHWAY REGIMEN - Non-Small Cell Lung     Administer weekly:     Paclitaxel      Carboplatin   **Always confirm dose/schedule in your pharmacy ordering system**  REASON: Continuation Of Treatment PRIOR TREATMENT: [id number]: Carboplatin AUC=2 + Paclitaxel 45 mg/m2 Weekly During Radiation TREATMENT RESPONSE: Unable to Evaluate  START ON PATHWAY REGIMEN - Non-Small Cell Lung     A cycle is every 14 days:     Durvalumab   **Always confirm dose/schedule in your pharmacy ordering system**  Patient Characteristics: Stage III - Unresectable, PS = 0, 1 AJCC T Category: T1b Current Disease Status: No Distant Mets or Local Recurrence AJCC N Category: N2 AJCC M Category: M0 AJCC 8 Stage Grouping: IIIA Performance Status: PS = 0, 1 Intent of Therapy: Curative Intent, Discussed with Patient

## 2018-07-10 NOTE — Telephone Encounter (Signed)
CT scan scheduled for 9/10 at 9am at [facility]. Left message with pt's wife with appt details and prep instructions. Instructed to call back with further questions or needs.

## 2018-07-10 NOTE — Telephone Encounter (Signed)
Pt continues to have productive cough. Has about 1 more day left of antibiotic medication to take. Wife asking if pt could get CT scan to make sure patient doesn't have any consolidation in lungs. Per Dr. Grayland Ormond, okay to order CT chest at this time.

## 2018-07-11 ENCOUNTER — Ambulatory Visit
Admission: RE | Admit: 2018-07-11 | Discharge: 2018-07-11 | Disposition: A | Discharge status: Home / Self Care | Payer: BLUE CROSS/BLUE SHIELD | Source: Ambulatory Visit | Attending: Oncology | Admitting: Oncology

## 2018-07-11 DIAGNOSIS — C3490 Malignant neoplasm of unspecified part of unspecified bronchus or lung: Secondary | ICD-10-CM | POA: Diagnosis present

## 2018-07-11 DIAGNOSIS — J439 Emphysema, unspecified: Secondary | ICD-10-CM | POA: Insufficient documentation

## 2018-07-11 DIAGNOSIS — I700 Atherosclerosis of aorta: Secondary | ICD-10-CM | POA: Diagnosis not present

## 2018-07-11 DIAGNOSIS — R059 Cough, unspecified: Secondary | ICD-10-CM

## 2018-07-11 DIAGNOSIS — R05 Cough: Secondary | ICD-10-CM

## 2018-07-11 HISTORY — DX: Malignant neoplasm of unspecified part of unspecified bronchus or lung: C34.90

## 2018-07-11 MED ORDER — IOPAMIDOL (ISOVUE-300) INJECTION 61%
75.0000 mL | Freq: Once | INTRAVENOUS | Status: AC | PRN
  Administered 2018-07-11: 75 mL via INTRAVENOUS

## 2018-07-12 ENCOUNTER — Encounter: Payer: Self-pay | Admitting: Oncology

## 2018-07-12 ENCOUNTER — Inpatient Hospital Stay: Payer: BLUE CROSS/BLUE SHIELD

## 2018-07-12 ENCOUNTER — Inpatient Hospital Stay (HOSPITAL_BASED_OUTPATIENT_CLINIC_OR_DEPARTMENT_OTHER): Payer: BLUE CROSS/BLUE SHIELD | Admitting: Oncology

## 2018-07-12 ENCOUNTER — Inpatient Hospital Stay: Payer: BLUE CROSS/BLUE SHIELD | Attending: Oncology

## 2018-07-12 VITALS — BP 115/75 | HR 77 | Temp 97.4°F | Resp 18 | Wt 203.2 lb

## 2018-07-12 DIAGNOSIS — Z923 Personal history of irradiation: Secondary | ICD-10-CM | POA: Insufficient documentation

## 2018-07-12 DIAGNOSIS — E871 Hypo-osmolality and hyponatremia: Secondary | ICD-10-CM | POA: Insufficient documentation

## 2018-07-12 DIAGNOSIS — Z9221 Personal history of antineoplastic chemotherapy: Secondary | ICD-10-CM | POA: Insufficient documentation

## 2018-07-12 DIAGNOSIS — F419 Anxiety disorder, unspecified: Secondary | ICD-10-CM | POA: Diagnosis not present

## 2018-07-12 DIAGNOSIS — F17210 Nicotine dependence, cigarettes, uncomplicated: Secondary | ICD-10-CM

## 2018-07-12 DIAGNOSIS — R911 Solitary pulmonary nodule: Secondary | ICD-10-CM

## 2018-07-12 DIAGNOSIS — Z5112 Encounter for antineoplastic immunotherapy: Secondary | ICD-10-CM | POA: Diagnosis present

## 2018-07-12 DIAGNOSIS — R509 Fever, unspecified: Secondary | ICD-10-CM | POA: Insufficient documentation

## 2018-07-12 DIAGNOSIS — Z86010 Personal history of colonic polyps: Secondary | ICD-10-CM | POA: Diagnosis not present

## 2018-07-12 DIAGNOSIS — C3491 Malignant neoplasm of unspecified part of right bronchus or lung: Secondary | ICD-10-CM

## 2018-07-12 DIAGNOSIS — C342 Malignant neoplasm of middle lobe, bronchus or lung: Secondary | ICD-10-CM

## 2018-07-12 LAB — CBC WITH DIFFERENTIAL/PLATELET
BASOS ABS: 0 10*3/uL (ref 0–0.1)
BASOS PCT: 1 %
EOS PCT: 1 %
Eosinophils Absolute: 0 10*3/uL (ref 0–0.7)
HEMATOCRIT: 37.9 % — AB (ref 39.0–52.0)
HEMOGLOBIN: 13.5 g/dL (ref 13.0–17.0)
LYMPHS ABS: 0.5 10*3/uL — AB (ref 1.0–3.6)
Lymphocytes Relative: 8 %
MCH: 36.5 pg — ABNORMAL HIGH (ref 26.0–34.0)
MCHC: 35.5 g/dL (ref 32.0–36.0)
MCV: 102.7 fL — AB (ref 80.0–100.0)
Monocytes Absolute: 0.5 10*3/uL (ref 0.2–1.0)
Monocytes Relative: 9 %
Neutro Abs: 4.7 10*3/uL (ref 1.4–6.5)
Neutrophils Relative %: 81 %
Platelets: 122 10*3/uL — ABNORMAL LOW (ref 150–440)
RBC: 3.69 MIL/uL — ABNORMAL LOW (ref 4.40–5.90)
RDW: 17 % — AB (ref 11.5–14.5)
WBC: 5.8 10*3/uL (ref 3.8–10.6)

## 2018-07-12 LAB — COMPREHENSIVE METABOLIC PANEL
ALT: 19 U/L (ref 0–44)
AST: 23 U/L (ref 15–41)
Albumin: 3.8 g/dL (ref 3.5–5.0)
Alkaline Phosphatase: 80 U/L (ref 38–126)
Anion gap: 9 (ref 5–15)
BILIRUBIN TOTAL: 0.7 mg/dL (ref 0.3–1.2)
BUN: 9 mg/dL (ref 8–23)
CALCIUM: 8.7 mg/dL — AB (ref 8.9–10.3)
CHLORIDE: 99 mmol/L (ref 98–111)
CO2: 24 mmol/L (ref 22–32)
CREATININE: 0.7 mg/dL (ref 0.61–1.24)
GFR calc Af Amer: >60 mL/min (ref >60)
Glucose, Bld: 124 mg/dL — ABNORMAL HIGH (ref 70–99)
Potassium: 3.9 mmol/L (ref 3.5–5.1)
Sodium: 132 mmol/L — ABNORMAL LOW (ref 135–145)
TOTAL PROTEIN: 6.5 g/dL (ref 6.5–8.1)

## 2018-07-12 MED ORDER — DIPHENHYDRAMINE HCL 50 MG/ML IJ SOLN
25.0000 mg | Freq: Once | INTRAMUSCULAR | Status: AC
  Administered 2018-07-12: 25 mg via INTRAVENOUS
  Filled 2018-07-12: qty 1

## 2018-07-12 MED ORDER — DEXAMETHASONE SODIUM PHOSPHATE 10 MG/ML IJ SOLN
10.0000 mg | Freq: Once | INTRAMUSCULAR | Status: AC
  Administered 2018-07-12: 10 mg via INTRAVENOUS
  Filled 2018-07-12: qty 1

## 2018-07-12 MED ORDER — SODIUM CHLORIDE 0.9 % IV SOLN
Freq: Once | INTRAVENOUS | Status: AC
  Administered 2018-07-12: 10:00:00 via INTRAVENOUS
  Filled 2018-07-12: qty 250

## 2018-07-12 MED ORDER — SODIUM CHLORIDE 0.9 % IV SOLN: 10.0000 mg | Freq: Once | INTRAVENOUS | Status: DC

## 2018-07-12 MED ORDER — HEPARIN SOD (PORK) LOCK FLUSH 100 UNIT/ML IV SOLN
500.0000 [IU] | Freq: Once | INTRAVENOUS | Status: AC
  Administered 2018-07-12: 500 [IU] via INTRAVENOUS
  Filled 2018-07-12: qty 5

## 2018-07-12 MED ORDER — SODIUM CHLORIDE 0.9 % IV SOLN
1000.0000 mg | Freq: Once | INTRAVENOUS | Status: AC
  Administered 2018-07-12: 1000 mg via INTRAVENOUS
  Filled 2018-07-12: qty 20

## 2018-07-12 MED ORDER — SODIUM CHLORIDE 0.9% FLUSH
10.0000 mL | INTRAVENOUS | Status: DC | PRN
  Administered 2018-07-12: 10 mL via INTRAVENOUS
  Filled 2018-07-12: qty 10

## 2018-07-12 NOTE — Progress Notes (Signed)
Pt and wife in for follow up, pt to start new med Imfinzi.

## 2018-07-17 ENCOUNTER — Encounter: Payer: Self-pay | Admitting: Urology

## 2018-07-17 ENCOUNTER — Ambulatory Visit: Payer: BLUE CROSS/BLUE SHIELD | Admitting: Urology

## 2018-07-17 VITALS — BP 112/68 | HR 92 | Ht 72.0 in | Wt 207.4 lb

## 2018-07-17 DIAGNOSIS — R351 Nocturia: Secondary | ICD-10-CM | POA: Diagnosis not present

## 2018-07-17 DIAGNOSIS — N138 Other obstructive and reflux uropathy: Secondary | ICD-10-CM

## 2018-07-17 DIAGNOSIS — N486 Induration penis plastica: Secondary | ICD-10-CM | POA: Diagnosis not present

## 2018-07-17 DIAGNOSIS — N401 Enlarged prostate with lower urinary tract symptoms: Secondary | ICD-10-CM

## 2018-07-17 NOTE — Progress Notes (Signed)
07/17/2018 3:52 PM   [name] [birth year] [id number]  Referring provider: Kirk Ruths, MD [facility] [place], [place] [address]  Chief Complaint  Patient presents with  . Benign Prostatic Hypertrophy  . Urinary Frequency    HPI: Patient is a 62 year old Caucasian male with lung cancer, testosterone deficiency, erectile dysfunction and BPH with LUT S who presents today for follow-up  Testosterone deficiency He is still having spontaneous erections at night.   He is on oxygen at night.   He had discontinued his therapy as he felt no improvement in his symptoms.  He is on oxygen at night.  His last serum testosterone level was 442 ng/dL on 01/11/2018.  His hemoglobin is 13. and his hematocrit is 37.9%% in 09/2017.  MRI of pituitary gland in 03/2018 was negative.    BPH WITH LUTS His IPSS score today is 14, which is moderate lower urinary tract symptomatology.  He is mostly satisfied with his quality life due to his urinary symptoms.  His previous I PSS score was 2/3.  His previous PVR was 65 mL.  His major complaints today  His father has been diagnosed with non fatal prostate cancer.  He is not sure whether or not the Myrbetriq is giving benefit or not. IPSS    Row Name 07/17/18 1400         International Prostate Symptom Score   How often have you had the sensation of not emptying your bladder?  Less than half the time     How often have you had to urinate less than every two hours?  About half the time     How often have you found you stopped and started again several times when you urinated?  Less than 1 in 5 times     How often have you found it difficult to postpone urination?  Less than half the time     How often have you had a weak urinary stream?  Less than half the time     How often have you had to strain to start urination?  Less than 1 in 5 times     How many times did you typically get up at night to urinate?  3 Times      Total IPSS Score  14       Quality of Life due to urinary symptoms   If you were to spend the rest of your life with your urinary condition just the way it is now how would you feel about that?  Mostly Satisfied        Score:  1-7 Mild 8-19 Moderate 20-35 Severe     PMH: Past Medical History:  Diagnosis Date  . Anginal pain ([place])   . Anxiety   . Chest pain   . Chicken pox   . Complication of anesthesia    o2 dropped after neck fusion  . COPD (chronic obstructive pulmonary disease) ([place])   . Coronary artery disease   . Cough    chronic  clear phlegm  . Dysrhythmia    palpitations  . GERD (gastroesophageal reflux disease)    h/o reflux/ hoarsness  . Hematochezia   . Hemorrhoids   . History of chickenpox   . History of colon polyps   . History of Helicobacter pylori infection   . Hoarseness   . Hypertension   . Lung cancer ([place]) 05/2016   Chemo + rad tx's.   Marland Kitchen  Migraines   . OSA (obstructive sleep apnea)    has CPAP but does not use  . Personal history of tobacco use, presenting hazards to health 03/05/2016  . Pneumonia    5/17  . Raynaud disease   . Raynaud disease   . Raynaud's disease   . Rotator cuff tear    on right  . Shortness of breath dyspnea   . Sleep apnea   . Ulcer (traumatic) of oral mucosa     Surgical History: Past Surgical History:  Procedure Laterality Date  . BACK SURGERY     cervical fusion x 2  . CARDIAC CATHETERIZATION    . CERVICAL DISCECTOMY     x 2  . COLONOSCOPY    . COLONOSCOPY N/A 07/25/2015   Procedure: COLONOSCOPY;  Surgeon: Lollie Sails, MD;  Location: [facility] ENDOSCOPY;  Service: Endoscopy;  Laterality: N/A;  . COLONOSCOPY WITH PROPOFOL N/A 10/04/2017   Procedure: COLONOSCOPY WITH PROPOFOL;  Surgeon: Lollie Sails, MD;  Location: [facility] ENDOSCOPY;  Service: Endoscopy;  Laterality: N/A;  . ELECTROMAGNETIC NAVIGATION BROCHOSCOPY Left 06/28/2016   Procedure: ELECTROMAGNETIC NAVIGATION BRONCHOSCOPY;  Surgeon: Vilinda Boehringer, MD;  Location: [facility] ORS;  Service: Cardiopulmonary;  Laterality: Left;  . ENDOBRONCHIAL ULTRASOUND N/A 04/11/2018   Procedure: ENDOBRONCHIAL ULTRASOUND;  Surgeon: [name], MD;  Location: [facility] ORS;  Service: Cardiopulmonary;  Laterality: N/A;  . ESOPHAGOGASTRODUODENOSCOPY N/A 07/25/2015   Procedure: ESOPHAGOGASTRODUODENOSCOPY (EGD);  Surgeon: Lollie Sails, MD;  Location: [facility] ENDOSCOPY;  Service: Endoscopy;  Laterality: N/A;  . NASAL SINUS SURGERY     x 2   . PORTA CATH INSERTION N/A 04/24/2018   Procedure: PORTA CATH INSERTION;  Surgeon: Algernon Huxley, MD;  Location: [facility] LAB;  Service: Cardiovascular;  Laterality: N/A;  . rotator cuff surgery Right    07/2016  . SEPTOPLASTY    . SKIN GRAFT      Home Medications:  Allergies as of 07/17/2018      Reactions   Lisinopril Rash   Varenicline Rash      Medication List        Accurate as of 07/17/18  3:52 PM. Always use your most recent med list.          acetaminophen 500 MG tablet Commonly known as:  TYLENOL Take 1,000 mg by mouth every 6 (six) hours as needed (for pain.).   albuterol 108 (90 Base) MCG/ACT inhaler Commonly known as:  PROVENTIL HFA;VENTOLIN HFA Inhale 2 puffs into the lungs every 6 (six) hours as needed for wheezing or shortness of breath.   atorvastatin 40 MG tablet Commonly known as:  LIPITOR Take 40 mg by mouth at bedtime.   BREO ELLIPTA 200-25 MCG/INH Aepb Generic drug:  fluticasone furoate-vilanterol TAKE 1 PUFF BY MOUTH EVERY DAY   busPIRone 15 MG tablet Commonly known as:  BUSPAR Take 15 mg by mouth 2 (two) times daily.   diazepam 5 MG tablet Commonly known as:  VALIUM Take 5 mg by mouth every 12 (twelve) hours as needed for anxiety (SCHEDULED EVERY NIGHT).   DULoxetine 60 MG capsule Commonly known as:  CYMBALTA Take 60 mg by mouth daily.   fluticasone 50 MCG/ACT nasal spray Commonly known as:  FLONASE Place 1 spray into both nostrils daily as needed (for  allergies.).   ipratropium-albuterol 0.5-2.5 (3) MG/3ML Soln Commonly known as:  DUONEB Take 3 mLs by nebulization every 6 (six) hours.   lidocaine 2 % solution Commonly known as:  XYLOCAINE  magic mouthwash w/lidocaine Soln Take 5 mLs by mouth 3 (three) times daily as needed for mouth pain.   metoprolol tartrate 50 MG tablet Commonly known as:  LOPRESSOR Take 50 mg by mouth 2 (two) times daily.   mirabegron ER 50 MG Tb24 tablet Commonly known as:  MYRBETRIQ Take 1 tablet (50 mg total) by mouth daily.   multivitamin with minerals Tabs tablet Take 1 tablet by mouth daily.   nicotine 21 mg/24hr patch Commonly known as:  NICODERM CQ - dosed in mg/24 hours Place 1 patch (21 mg total) onto the skin daily.   nitroGLYCERIN 0.4 MG SL tablet Commonly known as:  NITROSTAT Place 0.4 mg under the tongue every 5 (five) minutes as needed for chest pain.   oxybutynin 10 MG 24 hr tablet Commonly known as:  DITROPAN-XL   OXYGEN Inhale 2 L into the lungs at bedtime.   pantoprazole 40 MG tablet Commonly known as:  PROTONIX Take 40 mg by mouth daily.   potassium chloride 10 MEQ tablet Commonly known as:  K-DUR,KLOR-CON Take 10 mEq by mouth daily.   potassium chloride 10 MEQ tablet Commonly known as:  K-DUR TAKE 1 TABLET (10 MEQ TOTAL) BY MOUTH ONCE DAILY.   SPIRIVA RESPIMAT 2.5 MCG/ACT Aers Generic drug:  Tiotropium Bromide Monohydrate INHALE 2 PUFFS INTO THE LUNGS DAILY.   sucralfate 1 g tablet Commonly known as:  CARAFATE Take 1 tablet (1 g total) by mouth 3 (three) times daily before meals. Dissolve in warm water, swish and swallow       Allergies:  Allergies  Allergen Reactions  . Lisinopril Rash  . Varenicline Rash    Family History: Family History  Problem Relation Age of Onset  . Heart disease Father   . Prostate cancer Father   . Heart disease Paternal Grandmother   . Heart attack Maternal Grandfather 52  . Kidney cancer Neg Hx   . Bladder Cancer Neg Hx    . Other Neg Hx        pituitary abnormality    Social History:  reports that he has been smoking cigarettes. He has a 67.50 pack-year smoking history. He has never used smokeless tobacco. He reports that he drinks about 2.0 standard drinks of alcohol per week. He reports that he does not use drugs.  ROS: UROLOGY Frequent Urination?: No Hard to postpone urination?: No Burning/pain with urination?: No Get up at night to urinate?: No Leakage of urine?: No Urine stream starts and stops?: No Trouble starting stream?: No Do you have to strain to urinate?: No Blood in urine?: No Urinary tract infection?: No Sexually transmitted disease?: No Injury to kidneys or bladder?: No Painful intercourse?: No Weak stream?: No Erection problems?: No Penile pain?: No  Gastrointestinal Nausea?: No Vomiting?: No Indigestion/heartburn?: No Diarrhea?: No Constipation?: No  Constitutional Fever: No Night sweats?: No Weight loss?: No Fatigue?: No  Skin Skin rash/lesions?: No Itching?: No  Eyes Blurred vision?: No Double vision?: No  Ears/Nose/Throat Sore throat?: No Sinus problems?: No  Hematologic/Lymphatic Swollen glands?: Yes Easy bruising?: No  Cardiovascular Leg swelling?: No Chest pain?: No  Respiratory Cough?: No Shortness of breath?: No  Endocrine Excessive thirst?: No  Musculoskeletal Back pain?: No Joint pain?: No  Neurological Headaches?: No Dizziness?: No  Psychologic Depression?: No Anxiety?: No  Physical Exam: BP 112/68 (BP Location: Left Arm, Patient Position: Sitting, Cuff Size: Normal)   Pulse 92   Ht 6' (1.829 m)   Wt 207 lb 6.4 oz (94.1 kg)  BMI 28.13 kg/m   Constitutional: Well nourished. Alert and oriented, No acute distress. HEENT: [place] AT, moist mucus membranes. Trachea midline, no masses. Cardiovascular: No clubbing, cyanosis, or edema. Respiratory: Normal respiratory effort, no increased work of breathing. GI: Abdomen is soft,  non tender, non distended, no abdominal masses. Liver and spleen not palpable.  No hernias appreciated.  Stool sample for occult testing is not indicated.   GU: No CVA tenderness.  No bladder fullness or masses.  Patient with circumcised phallus.  Urethral meatus is patent.  No penile discharge. No penile lesions or rashes. Scrotum without lesions, cysts, rashes and/or edema.  Testicles are located scrotally bilaterally. No masses are appreciated in the testicles. Left and right epididymis are normal. Rectal: Patient with  normal sphincter tone. Anus and perineum without scarring or rashes. No rectal masses are appreciated. Prostate is approximately 50 grams, no nodules are appreciated. Seminal vesicles are normal. Skin: No rashes, bruises or suspicious lesions. Lymph: No cervical or inguinal adenopathy. Neurologic: Grossly intact, no focal deficits, moving all 4 extremities. Psychiatric: Normal mood and affect.  Laboratory Data: PSA History  0.8 ng/mL on 04/22/2014  1.0 ng/mL on 10/21/2014  1.07 ng/mL on 08/18/2016  1.1 ng/mL on 07/08/2017  Lab Results  Component Value Date   WBC 5.8 07/12/2018   HGB 13.5 07/12/2018   HCT 37.9 (L) 07/12/2018   MCV 102.7 (H) 07/12/2018   PLT 122 (L) 07/12/2018    Lab Results  Component Value Date   CREATININE 0.70 07/12/2018   I have reviewed the labs.  Assessment & Plan:    1. Testosterone deficiency  Not receiving treatment at this itme  2. BPH with LUTS IPSS score is 14/2, it is stable Continue conservative management, avoiding bladder irritants and timed voiding's  Patient could not tolerate alpha blockers due to hypotension Did not feel finasteride helped - declined cystoscopy Had a trial of oxybutynin but he did not find it effective in controlling his nocturia RTC in 12 months for I PSS, PSA, exam and PVR  3. Peyronie's disease Curvature is not greater than 30 degrees at this time Erections not painful   4. Nocturia We will  discontinue Myrbetriq to see if nocturia worsens  Return in about 1 year (around 07/18/2019) for IPSS, SHIM, PSA and exam.  These notes generated with voice recognition software. I apologize for typographical errors.  Zara Council, PA-C  [facility] [address] [place], [place] [address] [phone]

## 2018-07-18 ENCOUNTER — Telehealth: Payer: Self-pay | Admitting: Internal Medicine

## 2018-07-18 LAB — PSA: Prostate Specific Ag, Serum: 0.8 ng/mL (ref 0.0–4.0)

## 2018-07-18 MED ORDER — ALBUTEROL SULFATE HFA 108 (90 BASE) MCG/ACT IN AERS: 2.0000 | INHALATION_SPRAY | Freq: Four times a day (QID) | RESPIRATORY_TRACT | 1 refills | Status: DC | PRN

## 2018-07-18 MED ORDER — IPRATROPIUM-ALBUTEROL 0.5-2.5 (3) MG/3ML IN SOLN: 3.0000 mL | Freq: Four times a day (QID) | RESPIRATORY_TRACT | 5 refills | Status: DC

## 2018-07-18 NOTE — Telephone Encounter (Signed)
Patient calling States that prescription was incorrect  *STAT* If patient is at the pharmacy, call can be transferred to refill team.   1. Which medications need to be refilled? (please list name of each medication and dose if known)  Ipratropium-albuterol (DUONEB) 0.5-2.55 3 MG/ML solutions - take 3 mLs as needed  2. Which pharmacy/location (including street and city if local pharmacy) is medication to be sent to? [facility] [place]  3. Do they need a 30 day or 90 day supply? 90 day

## 2018-07-18 NOTE — Telephone Encounter (Signed)
°*  STAT* If patient is at the pharmacy, call can be transferred to refill team.   1. Which medications need to be refilled? (please list name of each medication and dose if known)  Albuterol (PROAIR) HFA 108 MCG  2. Which pharmacy/location (including street and city if local pharmacy) is medication to be sent to? [facility] [place]  3. Do they need a 30 day or 90 day supply? 90 day  Patient currently has a few boxes of this but it states it expired in Feb of 2019, patient would like to know if these are usable or if he should throw them out Please call to discuss

## 2018-07-18 NOTE — Telephone Encounter (Signed)
RX sent to the pharmacy. Nothing further needed.

## 2018-07-18 NOTE — Addendum Note (Signed)
Addended by: Stephanie Coup on: 07/18/2018 04:36 PM   Modules accepted: Orders

## 2018-07-18 NOTE — Telephone Encounter (Signed)
Rx sent 

## 2018-07-19 ENCOUNTER — Ambulatory Visit
Admission: RE | Admit: 2018-07-19 | Discharge: 2018-07-19 | Disposition: A | Discharge status: Home / Self Care | Payer: BLUE CROSS/BLUE SHIELD | Source: Ambulatory Visit | Attending: Oncology | Admitting: Oncology

## 2018-07-19 ENCOUNTER — Inpatient Hospital Stay: Payer: BLUE CROSS/BLUE SHIELD

## 2018-07-19 ENCOUNTER — Inpatient Hospital Stay (HOSPITAL_BASED_OUTPATIENT_CLINIC_OR_DEPARTMENT_OTHER): Payer: BLUE CROSS/BLUE SHIELD | Admitting: Oncology

## 2018-07-19 ENCOUNTER — Other Ambulatory Visit: Payer: Self-pay

## 2018-07-19 ENCOUNTER — Other Ambulatory Visit: Payer: Self-pay | Admitting: *Deleted

## 2018-07-19 ENCOUNTER — Other Ambulatory Visit: Payer: Self-pay | Admitting: Oncology

## 2018-07-19 ENCOUNTER — Encounter: Payer: Self-pay | Admitting: Oncology

## 2018-07-19 VITALS — BP 120/81 | HR 89 | Temp 98.5°F | Resp 18 | Wt 202.5 lb

## 2018-07-19 DIAGNOSIS — R509 Fever, unspecified: Secondary | ICD-10-CM

## 2018-07-19 DIAGNOSIS — Z5189 Encounter for other specified aftercare: Secondary | ICD-10-CM

## 2018-07-19 DIAGNOSIS — R059 Cough, unspecified: Secondary | ICD-10-CM

## 2018-07-19 DIAGNOSIS — R05 Cough: Secondary | ICD-10-CM | POA: Diagnosis present

## 2018-07-19 DIAGNOSIS — C3490 Malignant neoplasm of unspecified part of unspecified bronchus or lung: Secondary | ICD-10-CM

## 2018-07-19 DIAGNOSIS — Z95828 Presence of other vascular implants and grafts: Secondary | ICD-10-CM

## 2018-07-19 DIAGNOSIS — C342 Malignant neoplasm of middle lobe, bronchus or lung: Secondary | ICD-10-CM | POA: Diagnosis not present

## 2018-07-19 DIAGNOSIS — E871 Hypo-osmolality and hyponatremia: Secondary | ICD-10-CM

## 2018-07-19 DIAGNOSIS — I951 Orthostatic hypotension: Secondary | ICD-10-CM

## 2018-07-19 LAB — CBC WITH DIFFERENTIAL/PLATELET
Basophils Absolute: 0.1 10*3/uL (ref 0–0.1)
Basophils Relative: 1 %
EOS ABS: 0 10*3/uL (ref 0–0.7)
EOS PCT: 1 %
HCT: 36.4 % — ABNORMAL LOW (ref 40.0–52.0)
HEMOGLOBIN: 13.2 g/dL (ref 13.0–18.0)
LYMPHS ABS: 0.6 10*3/uL — AB (ref 1.0–3.6)
LYMPHS PCT: 8 %
MCH: 37.4 pg — AB (ref 26.0–34.0)
MCHC: 36.2 g/dL — AB (ref 32.0–36.0)
MCV: 103.3 fL — ABNORMAL HIGH (ref 80.0–100.0)
MONOS PCT: 10 %
Monocytes Absolute: 0.7 10*3/uL (ref 0.2–1.0)
Neutro Abs: 5.7 10*3/uL (ref 1.4–6.5)
Neutrophils Relative %: 80 %
PLATELETS: 166 10*3/uL (ref 150–440)
RBC: 3.53 MIL/uL — ABNORMAL LOW (ref 4.40–5.90)
RDW: 17.3 % — ABNORMAL HIGH (ref 11.5–14.5)
WBC: 7.2 10*3/uL (ref 3.8–10.6)

## 2018-07-19 LAB — URINALYSIS, COMPLETE (UACMP) WITH MICROSCOPIC
Bacteria, UA: NONE SEEN
Bilirubin Urine: NEGATIVE
GLUCOSE, UA: NEGATIVE mg/dL
HGB URINE DIPSTICK: NEGATIVE
Ketones, ur: NEGATIVE mg/dL
Leukocytes, UA: NEGATIVE
NITRITE: NEGATIVE
Protein, ur: NEGATIVE mg/dL
SPECIFIC GRAVITY, URINE: 1.01 (ref 1.005–1.030)
pH: 6 (ref 5.0–8.0)

## 2018-07-19 LAB — COMPREHENSIVE METABOLIC PANEL
ALBUMIN: 3.8 g/dL (ref 3.5–5.0)
ALT: 17 U/L (ref 0–44)
AST: 25 U/L (ref 15–41)
Alkaline Phosphatase: 83 U/L (ref 38–126)
Anion gap: 7 (ref 5–15)
BUN: 10 mg/dL (ref 8–23)
CHLORIDE: 97 mmol/L — AB (ref 98–111)
CO2: 26 mmol/L (ref 22–32)
CREATININE: 0.66 mg/dL (ref 0.61–1.24)
Calcium: 9 mg/dL (ref 8.9–10.3)
GFR calc Af Amer: >60 mL/min (ref >60)
GFR calc non Af Amer: >60 mL/min (ref >60)
GLUCOSE: 101 mg/dL — AB (ref 70–99)
Potassium: 4.3 mmol/L (ref 3.5–5.1)
Sodium: 130 mmol/L — ABNORMAL LOW (ref 135–145)
Total Bilirubin: 1.2 mg/dL (ref 0.3–1.2)
Total Protein: 7 g/dL (ref 6.5–8.1)

## 2018-07-19 LAB — INFLUENZA PANEL BY PCR (TYPE A & B)
Influenza A By PCR: NEGATIVE
Influenza B By PCR: NEGATIVE

## 2018-07-19 LAB — GROUP A STREP BY PCR: Group A Strep by PCR: NOT DETECTED

## 2018-07-19 LAB — LACTIC ACID, PLASMA: LACTIC ACID, VENOUS: 1.5 mmol/L (ref 0.5–1.9)

## 2018-07-19 MED ORDER — HEPARIN SOD (PORK) LOCK FLUSH 100 UNIT/ML IV SOLN
500.0000 [IU] | Freq: Once | INTRAVENOUS | Status: AC
  Administered 2018-07-19: 500 [IU] via INTRAVENOUS

## 2018-07-19 MED ORDER — SODIUM CHLORIDE 0.9% FLUSH
10.0000 mL | INTRAVENOUS | Status: DC | PRN
  Administered 2018-07-19: 10 mL via INTRAVENOUS
  Filled 2018-07-19: qty 10

## 2018-07-19 MED ORDER — SODIUM CHLORIDE 0.9 % IV SOLN
Freq: Once | INTRAVENOUS | Status: AC
  Administered 2018-07-19: 16:00:00 via INTRAVENOUS
  Filled 2018-07-19: qty 250

## 2018-07-19 NOTE — Progress Notes (Signed)
Symptom Management Consult note Paul Carpenter  [phone] [phone]  Patient Care Team: Kirk Ruths, MD as PCP - General (Internal Medicine)   Name of the patient: Paul Carpenter  [id number]  [birth year]   Date of visit: 07/19/2018  Diagnosis-stage IIIa non-small cell lung cancer   Chief Complaint: Low Grade Fever; tmax 99.4  Oncology History: Patient was last seen by primary medical oncologist Dr. Grayland Ormond on 07/12/2018 to discuss imaging results and consideration of cycle 1 maintenance Imfinzi.  Most recent CT scan from 07/11/2018 revealed pulmonary nodules grossly unchanged and considered stable. He received cycle 1 imfinzi.     Non-small cell lung cancer, right ([place])   04/24/2018 Initial Diagnosis    Non-small cell lung cancer, right ([place])    04/28/2018 Cancer Staging    Staging form: Lung, AJCC 8th Edition - Clinical stage from 04/28/2018: Stage IIIA (cT1b, cN2, cM0) - Signed by [name], MD on 04/28/2018    04/28/2018 - 06/27/2018 Chemotherapy    The patient had palonosetron (ALOXI) injection 0.25 mg, 0.25 mg, Intravenous,  Once, 8 of 8 cycles Administration: 0.25 mg (05/03/2018), 0.25 mg (05/11/2018), 0.25 mg (06/07/2018), 0.25 mg (06/14/2018), 0.25 mg (06/21/2018), 0.25 mg (05/17/2018), 0.25 mg (05/24/2018), 0.25 mg (05/31/2018) CARBOplatin (PARAPLATIN) 300 mg in sodium chloride 0.9 % 100 mL chemo infusion, 300 mg (100 % of original dose 299.4 mg), Intravenous,  Once, 8 of 8 cycles Dose modification:   (original dose 299.4 mg, Cycle 1) Administration: 300 mg (05/11/2018), 300 mg (06/07/2018), 300 mg (06/14/2018), 300 mg (06/21/2018), 300 mg (05/17/2018), 300 mg (05/24/2018), 300 mg (05/31/2018) PACLitaxel (TAXOL) 96 mg in sodium chloride 0.9 % 250 mL chemo infusion (</= 80mg /m2), 45 mg/m2 = 96 mg, Intravenous,  Once, 1 of 1 cycle Administration: 96 mg (05/03/2018)  for chemotherapy treatment.     07/10/2018 -  Chemotherapy    The patient  had durvalumab (IMFINZI) 1,000 mg in sodium chloride 0.9 % 100 mL chemo infusion, 920 mg, Intravenous,  Once, 1 of 26 cycles Administration: 1,000 mg (07/12/2018)  for chemotherapy treatment.      Subjective:     Paul Carpenter is a 62 y.o. male who presents for evaluation of fever. He has had the fever for 3 days. Symptoms have been gradually worsening. Symptoms are described as low grade fevers, and are worse at no particular time of day. Associated symptoms are body aches, chills, fatigue and poor appetite. Patient denies abdominal pain, diarrhea, nausea, otitis symptoms and urinary tract symptoms.  He has tried to alleviate the symptoms with acetaminophen and rest with no relief. The patient has cancer, immunocompromised state of on chemotherapy.  Has had sick contacts recently including two grandsons who were positive for Streptococcus pneumoniae and Haemophilus influenza.   The following portions of the patient's history were reviewed and updated as appropriate:  He  has a past medical history of Anginal pain ([place]), Anxiety, Chest pain, Chicken pox, Complication of anesthesia, COPD (chronic obstructive pulmonary disease) ([place]), Coronary artery disease, Cough, Dysrhythmia, GERD (gastroesophageal reflux disease), Hematochezia, Hemorrhoids, History of chickenpox, History of colon polyps, History of Helicobacter pylori infection, Hoarseness, Hypertension, Lung cancer ([place]) (05/2016), Migraines, OSA (obstructive sleep apnea), Personal history of tobacco use, presenting hazards to health (03/05/2016), Pneumonia, Raynaud disease, Raynaud disease, Raynaud's disease, Rotator cuff tear, Shortness of breath dyspnea, Sleep apnea, and Ulcer (traumatic) of oral mucosa. He does not have any pertinent problems on file. He  has a past surgical history that  includes Cervical discectomy; Nasal sinus surgery; Skin graft; Septoplasty; Cardiac catheterization; Colonoscopy; Colonoscopy (N/A, 07/25/2015);  Esophagogastroduodenoscopy (N/A, 07/25/2015); Back surgery; Electormagnetic navigation bronchoscopy (Left, 06/28/2016); rotator cuff surgery (Right); Colonoscopy with propofol (N/A, 10/04/2017); Endobronchial ultrasound (N/A, 04/11/2018); and PORTA CATH INSERTION (N/A, 04/24/2018). His family history includes Heart attack (age of onset: 40) in his maternal grandfather; Heart disease in his father and paternal grandmother; Prostate cancer in his father. He  reports that he has been smoking cigarettes. He has a 67.50 pack-year smoking history. He has never used smokeless tobacco. He reports that he drinks about 2.0 standard drinks of alcohol per week. He reports that he does not use drugs. He has a current medication list which includes the following prescription(s): acetaminophen, albuterol, atorvastatin, breo ellipta, buspirone, diazepam, duloxetine, fluticasone, ipratropium-albuterol, lidocaine, metoprolol tartrate, mirabegron er, multivitamin with minerals, nicotine, oxygen-helium, pantoprazole, potassium chloride, spiriva respimat, magic mouthwash w/lidocaine, nitroglycerin, sucralfate, and prochlorperazine. Current Outpatient Medications on File Prior to Visit  Medication Sig Dispense Refill  . acetaminophen (TYLENOL) 500 MG tablet Take 1,000 mg by mouth every 6 (six) hours as needed (for pain.).    Marland Kitchen albuterol (PROAIR HFA) 108 (90 Base) MCG/ACT inhaler Inhale 2 puffs into the lungs every 6 (six) hours as needed for wheezing or shortness of breath. 3 Inhaler 1  . atorvastatin (LIPITOR) 40 MG tablet Take 40 mg by mouth at bedtime.     Marland Kitchen BREO ELLIPTA 200-25 MCG/INH AEPB TAKE 1 PUFF BY MOUTH EVERY DAY 60 each 6  . busPIRone (BUSPAR) 15 MG tablet Take 15 mg by mouth 2 (two) times daily.   10  . diazepam (VALIUM) 5 MG tablet Take 5 mg by mouth every 12 (twelve) hours as needed for anxiety (SCHEDULED EVERY NIGHT).     . DULoxetine (CYMBALTA) 60 MG capsule Take 60 mg by mouth daily.     . fluticasone (FLONASE)  50 MCG/ACT nasal spray Place 1 spray into both nostrils daily as needed (for allergies.).     Marland Kitchen ipratropium-albuterol (DUONEB) 0.5-2.5 (3) MG/3ML SOLN Take 3 mLs by nebulization every 6 (six) hours. 360 mL 5  . lidocaine (XYLOCAINE) 2 % solution   2  . metoprolol tartrate (LOPRESSOR) 50 MG tablet Take 50 mg by mouth 2 (two) times daily.    . mirabegron ER (MYRBETRIQ) 50 MG TB24 tablet Take 1 tablet (50 mg total) by mouth daily. 90 tablet 3  . Multiple Vitamin (MULTIVITAMIN WITH MINERALS) TABS tablet Take 1 tablet by mouth daily.     . nicotine (NICODERM CQ - DOSED IN MG/24 HOURS) 21 mg/24hr patch Place 1 patch (21 mg total) onto the skin daily. 30 patch 12  . OXYGEN Inhale 2 L into the lungs at bedtime.     . pantoprazole (PROTONIX) 40 MG tablet Take 40 mg by mouth daily.  3  . potassium chloride (K-DUR) 10 MEQ tablet TAKE 1 TABLET (10 MEQ TOTAL) BY MOUTH ONCE DAILY.  3  . SPIRIVA RESPIMAT 2.5 MCG/ACT AERS INHALE 2 PUFFS INTO THE LUNGS DAILY. 4 g 6  . magic mouthwash w/lidocaine SOLN Take 5 mLs by mouth 3 (three) times daily as needed for mouth pain.    . nitroGLYCERIN (NITROSTAT) 0.4 MG SL tablet Place 0.4 mg under the tongue every 5 (five) minutes as needed for chest pain.     Marland Kitchen sucralfate (CARAFATE) 1 g tablet Take 1 tablet (1 g total) by mouth 3 (three) times daily before meals. Dissolve in warm water, swish and swallow (Patient not  taking: Reported on 07/19/2018) 90 tablet 6  . [DISCONTINUED] prochlorperazine (COMPAZINE) 10 MG tablet Take 1 tablet (10 mg total) by mouth every 6 (six) hours as needed (Nausea or vomiting). (Patient not taking: Reported on 05/24/2018) 60 tablet 2   No current facility-administered medications on file prior to visit.    He is allergic to lisinopril and varenicline..  Review of Systems A comprehensive review of systems was negative except for: Respiratory: positive for cough, dyspnea on exertion, stridor and wheezing Integument/breast: positive for pallor    Objective:    Lungs: wheezes LLL and RLL   Assessment:    Fever is likely secondary to uncertain etiology. Patient admits to being around his grandson who recently was diagnosed with Streptococcus pneumoniae and Haemophilus influenza last week.  Grandson was treated with antibiotics but continued to be symptomatic.  Unclear etiology.    Plan:    Supportive care with appropriate antipyretics and fluids. Obtain labs per orders. Chest x-ray.  Results: Expected radiation changes at both lung apices.  No findings to suggest pneumonia.  If symptoms persisted additional imaging was recommended.  Give 1 L NaCl in clinic.  Blood pressure on the lower side today.  Not considered orthostatic but soft.  Currently managed on 50 mg metoprolol BID.  Will adjust blood pressure medicine from 50 mg to 25 mg BID with close blood pressure monitoring.  UA and urine culture.  Negative. Lactic acid and blood cultures given his immunocompromise status. Negative. Influenza panel and Strep A .  Negative. History of hyponatremia: Instructed to drink plenty of fluids and to not restrict salt.  Baseline sodium level is 132.  Giving fluids as mentioned above.  Addendum: Infection work-up negative. Consulted Dr. Grayland Ormond and up-to-date and Imfinzi can cause fevers (<15%), fatigue (34-39%), hyponatremia (33%) and cough (40%).  Educated the patient and his spouse about side effects of Imfinzi and his symptoms are likely related to the medication and not d/t active infection.  They understand.  Patient appears to be feeling better 24 hours since visit.  RTC as scheduled for cycle 2 maintenance Imfinzi with labs.  Greater than 50% was spent in counseling and coordination of care with this patient including but not limited to discussion of the relevant topics above (See A&P) including, but not limited to diagnosis and management of acute and chronic medical conditions.   Faythe Casa, NP 07/19/2018 4:09 PM

## 2018-07-20 LAB — URINE CULTURE: Culture: NO GROWTH

## 2018-07-23 NOTE — Progress Notes (Signed)
[place]  Telephone:([phone]) [phone] Fax:(336) [phone]  ID: [name] OB: 03-08-1956  MR#: [id number]  ZYS#:[id number]  Patient Care Team: Kirk Ruths, MD as PCP - General (Internal Medicine)  CHIEF COMPLAINT: Stage IIIa non-small cell lung cancer, right middle lobe.  INTERVAL HISTORY: Patient returns to clinic today for further evaluation and consideration of cycle 2 of maintenance Imfinzi.  He was evaluated in symptom management last week complaining of fever, but did not have any evidence of active infection.  He currently feels well and is back to his baseline.  He continues to be anxious.  He has no neurologic complaints. He denies any recent fevers or illnesses.  He does not complain of dysphasia today.  He has no chest pain, cough, hemoptysis, or shortness of breath. He denies any nausea, vomiting, constipation, or diarrhea. He has no urinary complaints.  Patient offers no further specific complaints today.  REVIEW OF SYSTEMS:   Review of Systems  Constitutional: Positive for malaise/fatigue. Negative for fever and weight loss.  HENT: Negative.  Negative for sore throat.   Respiratory: Negative.  Negative for cough, hemoptysis and shortness of breath.   Cardiovascular: Negative.  Negative for chest pain and leg swelling.  Gastrointestinal: Negative.  Negative for abdominal pain, blood in stool and melena.  Genitourinary: Negative.  Negative for dysuria.  Musculoskeletal: Negative.  Negative for joint pain.  Skin: Negative.  Negative for rash.  Neurological: Negative.  Negative for sensory change, focal weakness, weakness and headaches.  Psychiatric/Behavioral: The patient is nervous/anxious.     As per HPI. Otherwise, a complete review of systems is negative.  PAST MEDICAL HISTORY: Past Medical History:  Diagnosis Date  . Anginal pain ([place])   . Anxiety   . Chest pain   . Chicken pox   . Complication of anesthesia    o2 dropped after neck  fusion  . COPD (chronic obstructive pulmonary disease) ([place])   . Coronary artery disease   . Cough    chronic  clear phlegm  . Dysrhythmia    palpitations  . GERD (gastroesophageal reflux disease)    h/o reflux/ hoarsness  . Hematochezia   . Hemorrhoids   . History of chickenpox   . History of colon polyps   . History of Helicobacter pylori infection   . Hoarseness   . Hypertension   . Lung cancer ([place]) 05/2016   Chemo + rad tx's.   . Migraines   . OSA (obstructive sleep apnea)    has CPAP but does not use  . Personal history of tobacco use, presenting hazards to health 03/05/2016  . Pneumonia    5/17  . Raynaud disease   . Raynaud disease   . Raynaud's disease   . Rotator cuff tear    on right  . Shortness of breath dyspnea   . Sleep apnea   . Ulcer (traumatic) of oral mucosa     PAST SURGICAL HISTORY: Past Surgical History:  Procedure Laterality Date  . BACK SURGERY     cervical fusion x 2  . CARDIAC CATHETERIZATION    . CERVICAL DISCECTOMY     x 2  . COLONOSCOPY    . COLONOSCOPY N/A 07/25/2015   Procedure: COLONOSCOPY;  Surgeon: Lollie Sails, MD;  Location: [facility] ENDOSCOPY;  Service: Endoscopy;  Laterality: N/A;  . COLONOSCOPY WITH PROPOFOL N/A 10/04/2017   Procedure: COLONOSCOPY WITH PROPOFOL;  Surgeon: Lollie Sails, MD;  Location: [facility] ENDOSCOPY;  Service: Endoscopy;  Laterality:  N/A;  . ELECTROMAGNETIC NAVIGATION BROCHOSCOPY Left 06/28/2016   Procedure: ELECTROMAGNETIC NAVIGATION BRONCHOSCOPY;  Surgeon: Vilinda Boehringer, MD;  Location: [facility] ORS;  Service: Cardiopulmonary;  Laterality: Left;  . ENDOBRONCHIAL ULTRASOUND N/A 04/11/2018   Procedure: ENDOBRONCHIAL ULTRASOUND;  Surgeon: [name], MD;  Location: [facility] ORS;  Service: Cardiopulmonary;  Laterality: N/A;  . ESOPHAGOGASTRODUODENOSCOPY N/A 07/25/2015   Procedure: ESOPHAGOGASTRODUODENOSCOPY (EGD);  Surgeon: Lollie Sails, MD;  Location[facility] ENDOSCOPY;  Service: Endoscopy;  Laterality: N/A;  .  NASAL SINUS SURGERY     x 2   . PORTA CATH INSERTION N/A 04/24/2018   Procedure: PORTA CATH INSERTION;  Surgeon: Algernon Huxley, MD;  Location: [place] CV LAB;  Service: Cardiovascular;  Laterality: N/A;  . rotator cuff surgery Right    07/2016  . SEPTOPLASTY    . SKIN GRAFT      FAMILY HISTORY Family History  Problem Relation Age of Onset  . Heart disease Father   . Prostate cancer Father   . Heart disease Paternal Grandmother   . Heart attack Maternal Grandfather 52  . Kidney cancer Neg Hx   . Bladder Cancer Neg Hx   . Other Neg Hx        pituitary abnormality       ADVANCED DIRECTIVES:    HEALTH MAINTENANCE: Social History   Tobacco Use  . Smoking status: Current Every Day Smoker    Packs/day: 1.50    Years: 45.00    Pack years: 67.50    Types: Cigarettes  . Smokeless tobacco: Never Used  Substance Use Topics  . Alcohol use: Yes    Alcohol/week: 2.0 standard drinks    Types: 2 Standard drinks or equivalent per week    Comment: moderate  . Drug use: No     Allergies  Allergen Reactions  . Lisinopril Rash  . Varenicline Rash    Current Outpatient Medications  Medication Sig Dispense Refill  . atorvastatin (LIPITOR) 40 MG tablet Take 40 mg by mouth at bedtime.     Marland Kitchen BREO ELLIPTA 200-25 MCG/INH AEPB TAKE 1 PUFF BY MOUTH EVERY DAY 60 each 6  . busPIRone (BUSPAR) 15 MG tablet Take 15 mg by mouth 2 (two) times daily.   10  . diazepam (VALIUM) 5 MG tablet Take 5 mg by mouth every 12 (twelve) hours as needed for anxiety (SCHEDULED EVERY NIGHT).     . DULoxetine (CYMBALTA) 60 MG capsule Take 60 mg by mouth daily.     . fluticasone (FLONASE) 50 MCG/ACT nasal spray Place 1 spray into both nostrils daily as needed (for allergies.).     Marland Kitchen ipratropium-albuterol (DUONEB) 0.5-2.5 (3) MG/3ML SOLN Take 3 mLs by nebulization every 6 (six) hours. 360 mL 5  . lidocaine (XYLOCAINE) 2 % solution   2  . metoprolol tartrate (LOPRESSOR) 50 MG tablet Take 50 mg by mouth 2  (two) times daily.    . mirabegron ER (MYRBETRIQ) 50 MG TB24 tablet Take 1 tablet (50 mg total) by mouth daily. 90 tablet 3  . Multiple Vitamin (MULTIVITAMIN WITH MINERALS) TABS tablet Take 1 tablet by mouth daily.     . nicotine (NICODERM CQ - DOSED IN MG/24 HOURS) 21 mg/24hr patch Place 1 patch (21 mg total) onto the skin daily. 30 patch 12  . OXYGEN Inhale 2 L into the lungs at bedtime.     . pantoprazole (PROTONIX) 40 MG tablet Take 40 mg by mouth daily.  3  . potassium chloride (K-DUR) 10 MEQ tablet  TAKE 1 TABLET (10 MEQ TOTAL) BY MOUTH ONCE DAILY.  3  . SPIRIVA RESPIMAT 2.5 MCG/ACT AERS INHALE 2 PUFFS INTO THE LUNGS DAILY. 4 g 6  . acetaminophen (TYLENOL) 500 MG tablet Take 1,000 mg by mouth every 6 (six) hours as needed (for pain.).    Marland Kitchen albuterol (PROAIR HFA) 108 (90 Base) MCG/ACT inhaler Inhale 2 puffs into the lungs every 6 (six) hours as needed for wheezing or shortness of breath. (Patient not taking: Reported on 07/26/2018) 3 Inhaler 1  . magic mouthwash w/lidocaine SOLN Take 5 mLs by mouth 3 (three) times daily as needed for mouth pain.    . nitroGLYCERIN (NITROSTAT) 0.4 MG SL tablet Place 0.4 mg under the tongue every 5 (five) minutes as needed for chest pain.     Marland Kitchen sucralfate (CARAFATE) 1 g tablet Take 1 tablet (1 g total) by mouth 3 (three) times daily before meals. Dissolve in warm water, swish and swallow (Patient not taking: Reported on 07/26/2018) 90 tablet 6   No current facility-administered medications for this visit.     OBJECTIVE: Vitals:   07/26/18 0844  BP: 135/82  Pulse: 91  Resp: 18  Temp: (!) 96.6 F (35.9 C)     Body mass index is 27.64 kg/m.    ECOG FS:0 - Asymptomatic  General: Well-developed, well-nourished, no acute distress. Eyes: Pink conjunctiva, anicteric sclera. HEENT: Normocephalic, moist mucous membranes. Lungs: Clear to auscultation bilaterally. Heart: Regular rate and rhythm. No rubs, murmurs, or gallops. Abdomen: Soft, nontender,  nondistended. No organomegaly noted, normoactive bowel sounds. Musculoskeletal: No edema, cyanosis, or clubbing. Neuro: Alert, answering all questions appropriately. Cranial nerves grossly intact. Skin: No rashes or petechiae noted. Psych: Normal affect.  LAB RESULTS:  Lab Results  Component Value Date   NA 134 (L) 07/26/2018   K 3.8 07/26/2018   CL 98 07/26/2018   CO2 26 07/26/2018   GLUCOSE 111 (H) 07/26/2018   BUN <5 (L) 07/26/2018   CREATININE 0.64 07/26/2018   CALCIUM 9.1 07/26/2018   PROT 6.8 07/26/2018   ALBUMIN 3.6 07/26/2018   AST 22 07/26/2018   ALT 21 07/26/2018   ALKPHOS 92 07/26/2018   BILITOT 0.6 07/26/2018   GFRNONAA >60 07/26/2018   GFRAA >60 07/26/2018    Lab Results  Component Value Date   WBC 5.6 07/26/2018   NEUTROABS 4.5 07/26/2018   HGB 12.6 (L) 07/26/2018   HCT 34.9 (L) 07/26/2018   MCV 104.0 (H) 07/26/2018   PLT 244 07/26/2018     STUDIES:  ASSESSMENT: Stage IIIa non-small cell lung cancer, right middle lobe.  PLAN:    1. Stage IIIa non-small cell lung cancer, right middle lobe: Case discussed with pathologist and unable to determine whether this is adenocarcinoma or squamous carcinoma.  There is also insufficient tissue to do further testing.  Liquid biopsy did not reveal any actionable mutations.  PET scan was denied by insurance, therefore proceeded with CT scan on July 11, 2018.  Results reviewed independently revealing a progressive nodule in the medial left upper lobe is suspicious but not definitive of malignancy.  The remainder of his known disease is grossly unchanged. MRI of the brain completed on Mar 28, 2018 reviewed independently did not reveal metastatic disease.  Patient completed XRT June 26, 2018.  He completed his concurrent single agent carboplatinum on June 21, 2018.  Patient had a reaction to Taxol during cycle 1 and this was discontinued.  Patient initiated maintenance Imfinzi on July 12, 2018.  Proceed  with  cycle 2 of treatment today.  Return to clinic in 2 weeks for treatment only and then in 4 weeks for further evaluation and consideration of cycle 4.   2.  Secondary polycythemia: Resolved.  Likely secondary to heavy tobacco use.  Previously, the remainder of his laboratory work, including JAK-2 mutation, was either negative or within normal limits.  3. Left upper lobe pulmonary nodule: Imaging results as above.  Previously, biopsy completed on October 04, 2017 was negative for malignancy.  Will discuss with radiation oncology if additional XRT is warranted. 4.  Colon polyps:  Patient has a personal history of greater then 10 adenomatous polyps on his most recent conoloscopy. He does not know of any family history of increased polyps or colon cancer.  Genetic testing to assess for the APC mutation for FAP or AFAP was negative. Continue colonoscopies as per GI. 5. Tobacco Use: Patient continues to heavily smoke.  He expressed understanding by continue to smokeless increases his chance of recurrence. 6. Anxiety: Chronic.  Continue IV Benadryl with each treatment.  Continue treatment and evaluation per primary care. 7.  Hyponatremia: Mildly improved to 134.  Monitor.  Patient expressed understanding and was in agreement with this plan. He also understands that He can call clinic at any time with any questions, concerns, or complaints.    [name], MD   07/30/2018 7:07 AM

## 2018-07-24 LAB — CULTURE, BLOOD (ROUTINE X 2)
CULTURE: NO GROWTH
Culture: NO GROWTH
SPECIAL REQUESTS: ADEQUATE
Special Requests: ADEQUATE

## 2018-07-26 ENCOUNTER — Inpatient Hospital Stay: Payer: BLUE CROSS/BLUE SHIELD

## 2018-07-26 ENCOUNTER — Other Ambulatory Visit: Payer: Self-pay

## 2018-07-26 ENCOUNTER — Inpatient Hospital Stay: Payer: BLUE CROSS/BLUE SHIELD | Admitting: Occupational Therapy

## 2018-07-26 ENCOUNTER — Inpatient Hospital Stay (HOSPITAL_BASED_OUTPATIENT_CLINIC_OR_DEPARTMENT_OTHER): Payer: BLUE CROSS/BLUE SHIELD | Admitting: Oncology

## 2018-07-26 VITALS — BP 135/82 | HR 91 | Temp 96.6°F | Resp 18 | Wt 203.8 lb

## 2018-07-26 DIAGNOSIS — F419 Anxiety disorder, unspecified: Secondary | ICD-10-CM | POA: Diagnosis not present

## 2018-07-26 DIAGNOSIS — R911 Solitary pulmonary nodule: Secondary | ICD-10-CM

## 2018-07-26 DIAGNOSIS — Z9221 Personal history of antineoplastic chemotherapy: Secondary | ICD-10-CM

## 2018-07-26 DIAGNOSIS — Z86010 Personal history of colonic polyps: Secondary | ICD-10-CM

## 2018-07-26 DIAGNOSIS — C3491 Malignant neoplasm of unspecified part of right bronchus or lung: Secondary | ICD-10-CM

## 2018-07-26 DIAGNOSIS — C342 Malignant neoplasm of middle lobe, bronchus or lung: Secondary | ICD-10-CM | POA: Diagnosis not present

## 2018-07-26 DIAGNOSIS — F17210 Nicotine dependence, cigarettes, uncomplicated: Secondary | ICD-10-CM | POA: Diagnosis not present

## 2018-07-26 DIAGNOSIS — I951 Orthostatic hypotension: Secondary | ICD-10-CM

## 2018-07-26 DIAGNOSIS — E871 Hypo-osmolality and hyponatremia: Secondary | ICD-10-CM

## 2018-07-26 DIAGNOSIS — Z923 Personal history of irradiation: Secondary | ICD-10-CM

## 2018-07-26 LAB — COMPREHENSIVE METABOLIC PANEL
ALBUMIN: 3.6 g/dL (ref 3.5–5.0)
ALT: 21 U/L (ref 0–44)
AST: 22 U/L (ref 15–41)
Alkaline Phosphatase: 92 U/L (ref 38–126)
Anion gap: 10 (ref 5–15)
CHLORIDE: 98 mmol/L (ref 98–111)
CO2: 26 mmol/L (ref 22–32)
Calcium: 9.1 mg/dL (ref 8.9–10.3)
Creatinine, Ser: 0.64 mg/dL (ref 0.61–1.24)
GFR calc Af Amer: >60 mL/min (ref >60)
GLUCOSE: 111 mg/dL — AB (ref 70–99)
Potassium: 3.8 mmol/L (ref 3.5–5.1)
Sodium: 134 mmol/L — ABNORMAL LOW (ref 135–145)
Total Bilirubin: 0.6 mg/dL (ref 0.3–1.2)
Total Protein: 6.8 g/dL (ref 6.5–8.1)

## 2018-07-26 LAB — CBC WITH DIFFERENTIAL/PLATELET
Basophils Absolute: 0 10*3/uL (ref 0–0.1)
Basophils Relative: 1 %
Eosinophils Absolute: 0 10*3/uL (ref 0–0.7)
Eosinophils Relative: 1 %
HEMATOCRIT: 34.9 % — AB (ref 40.0–52.0)
Hemoglobin: 12.6 g/dL — ABNORMAL LOW (ref 13.0–18.0)
LYMPHS ABS: 0.5 10*3/uL — AB (ref 1.0–3.6)
LYMPHS PCT: 9 %
MCH: 37.5 pg — AB (ref 26.0–34.0)
MCHC: 36 g/dL (ref 32.0–36.0)
MCV: 104 fL — AB (ref 80.0–100.0)
Monocytes Absolute: 0.5 10*3/uL (ref 0.2–1.0)
Monocytes Relative: 9 %
Neutro Abs: 4.5 10*3/uL (ref 1.4–6.5)
Neutrophils Relative %: 80 %
Platelets: 244 10*3/uL (ref 150–440)
RBC: 3.35 MIL/uL — AB (ref 4.40–5.90)
RDW: 16.8 % — ABNORMAL HIGH (ref 11.5–14.5)
WBC: 5.6 10*3/uL (ref 3.8–10.6)

## 2018-07-26 MED ORDER — SODIUM CHLORIDE 0.9 % IV SOLN
Freq: Once | INTRAVENOUS | Status: AC
  Administered 2018-07-26: 10:00:00 via INTRAVENOUS
  Filled 2018-07-26: qty 250

## 2018-07-26 MED ORDER — HEPARIN SOD (PORK) LOCK FLUSH 100 UNIT/ML IV SOLN
500.0000 [IU] | Freq: Once | INTRAVENOUS | Status: AC
  Administered 2018-07-26: 500 [IU] via INTRAVENOUS
  Filled 2018-07-26: qty 5

## 2018-07-26 MED ORDER — SODIUM CHLORIDE 0.9 % IV SOLN: 10.0000 mg | Freq: Once | INTRAVENOUS | Status: DC

## 2018-07-26 MED ORDER — DEXAMETHASONE SODIUM PHOSPHATE 10 MG/ML IJ SOLN
10.0000 mg | Freq: Once | INTRAMUSCULAR | Status: AC
  Administered 2018-07-26: 10 mg via INTRAVENOUS
  Filled 2018-07-26: qty 1

## 2018-07-26 MED ORDER — PALONOSETRON HCL INJECTION 0.25 MG/5ML
0.2500 mg | Freq: Once | INTRAVENOUS | Status: AC
  Administered 2018-07-26: 0.25 mg via INTRAVENOUS
  Filled 2018-07-26: qty 5

## 2018-07-26 MED ORDER — SODIUM CHLORIDE 0.9 % IV SOLN
1000.0000 mg | Freq: Once | INTRAVENOUS | Status: AC
  Administered 2018-07-26: 1000 mg via INTRAVENOUS
  Filled 2018-07-26: qty 20

## 2018-07-26 MED ORDER — DIPHENHYDRAMINE HCL 50 MG/ML IJ SOLN
25.0000 mg | Freq: Once | INTRAMUSCULAR | Status: AC
  Administered 2018-07-26: 25 mg via INTRAVENOUS
  Filled 2018-07-26: qty 1

## 2018-07-26 NOTE — Progress Notes (Signed)
Here for follow up. Stated " feeling pretty good" feeling weak and tired/ low energy.

## 2018-07-26 NOTE — Therapy (Signed)
[facility] [address] [place], [place], [place], [address] Phone: [phone]   Fax:  [phone]  Occupational Therapy Screen   Patient Details  Name: Paul Carpenter MRN: [id number] Date of Birth: 31-Mar-1956 No data recorded  Encounter Date: 07/26/2018    Past Medical History:  Diagnosis Date  . Anginal pain ([place])   . Anxiety   . Chest pain   . Chicken pox   . Complication of anesthesia    o2 dropped after neck fusion  . COPD (chronic obstructive pulmonary disease) ([place])   . Coronary artery disease   . Cough    chronic  clear phlegm  . Dysrhythmia    palpitations  . GERD (gastroesophageal reflux disease)    h/o reflux/ hoarsness  . Hematochezia   . Hemorrhoids   . History of chickenpox   . History of colon polyps   . History of Helicobacter pylori infection   . Hoarseness   . Hypertension   . Lung cancer ([place]) 05/2016   Chemo + rad tx's.   . Migraines   . OSA (obstructive sleep apnea)    has CPAP but does not use  . Personal history of tobacco use, presenting hazards to health 03/05/2016  . Pneumonia    5/17  . Raynaud disease   . Raynaud disease   . Raynaud's disease   . Rotator cuff tear    on right  . Shortness of breath dyspnea   . Sleep apnea   . Ulcer (traumatic) of oral mucosa     Past Surgical History:  Procedure Laterality Date  . BACK SURGERY     cervical fusion x 2  . CARDIAC CATHETERIZATION    . CERVICAL DISCECTOMY     x 2  . COLONOSCOPY    . COLONOSCOPY N/A 07/25/2015   Procedure: COLONOSCOPY;  Surgeon: Lollie Sails, MD;  Location: [facility] ENDOSCOPY;  Service: Endoscopy;  Laterality: N/A;  . COLONOSCOPY WITH PROPOFOL N/A 10/04/2017   Procedure: COLONOSCOPY WITH PROPOFOL;  Surgeon: Lollie Sails, MD;  Location: [facility] ENDOSCOPY;  Service: Endoscopy;  Laterality: N/A;  . ELECTROMAGNETIC NAVIGATION BROCHOSCOPY Left 06/28/2016   Procedure: ELECTROMAGNETIC NAVIGATION BRONCHOSCOPY;   Surgeon: Vilinda Boehringer, MD;  Location: [facility] ORS;  Service: Cardiopulmonary;  Laterality: Left;  . ENDOBRONCHIAL ULTRASOUND N/A 04/11/2018   Procedure: ENDOBRONCHIAL ULTRASOUND;  Surgeon: [name], MD;  Location: [facility] ORS;  Service: Cardiopulmonary;  Laterality: N/A;  . ESOPHAGOGASTRODUODENOSCOPY N/A 07/25/2015   Procedure: ESOPHAGOGASTRODUODENOSCOPY (EGD);  Surgeon: Lollie Sails, MD;  Location: [facility] ENDOSCOPY;  Service: Endoscopy;  Laterality: N/A;  . NASAL SINUS SURGERY     x 2   . PORTA CATH INSERTION N/A 04/24/2018   Procedure: PORTA CATH INSERTION;  Surgeon: Algernon Huxley, MD;  Location: [place] CV LAB;  Service: Cardiovascular;  Laterality: N/A;  . rotator cuff surgery Right    07/2016  . SEPTOPLASTY    . SKIN GRAFT      There were no vitals filed for this visit.  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  Did not had any fall the last 6 months Pt has to hold on to counter for balance to do one leg stand L and R , tapping feet up on step , and walk BW and sideways  Pt to work on his own this week - walking to [employer] and increase reps gradually  At counter while holding on - walk BW and FW and Sideways- 2 min 1 x day  Heal raises - 10-15 reps - hold on to counter Tapping a step /stool - while holding on with hands - alternate feet   - 2 min   Gradually over the next week see if with less supports with hands  CARE program recommended- and monitor BP - pt BP do fluctuate and orthostatic  Nsg put in referral                   OT Education - 07/26/18 1112    Education Details  HEP and CARE program education    Person(s) Educated  Patient;Spouse    Methods  Demonstration    Comprehension  Verbalized understanding;Returned demonstration                   Patient will benefit from skilled therapeutic intervention in order to improve the following deficits and impairments:     Visit Diagnosis: Orthostatic hypotension    Problem List Patient Active Problem List   Diagnosis Date Noted  . Non-small cell lung cancer, right ([place]) 04/24/2018  . Oral ulcer 02/16/2018  . 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 Migraine headache 03/07/2017  . CAP (community acquired pneumonia) 09/11/2016  . COPD exacerbation ([place]) 09/11/2016  . Hyponatremia 09/11/2016  . Leukocytosis 09/11/2016  . Thrombocytopenia ([place]) 09/11/2016  . Sepsis ([place]) 09/09/2016  . Cigarette smoker 06/11/2016  . Cervical radiculopathy 04/15/2016  . 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 Impingement syndrome of right shoulder 03/09/2016  . Personal history of tobacco use, presenting hazards to health 03/05/2016  . Health care maintenance 09/29/2015  . 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  . Benign essential hypertension 05/28/2015  . Polycythemia 03/24/2015  . CAD (coronary artery disease) 12/12/2014  . Carotid artery disease ([place]) 12/12/2014  . Disequilibrium 12/12/2014  . Mixed hyperlipidemia 12/10/2014  . 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 headache (migraine equivalents) 08/12/2014  . Incomplete emptying of bladder 06/04/2014  . Anxiety 05/18/2014  . Chronic coronary artery disease 05/18/2014  . Chronic headaches 05/18/2014  . Acute shoulder pain 03/15/2014  . Impingement syndrome of left shoulder 03/15/2014  . Lung mass 12/06/2013  . Kidney stone 11/24/2013  . COPD (chronic obstructive pulmonary disease) ([place]) 04/24/2013  . Tobacco use disorder 04/24/2013  . Obstructive sleep apnea  syndrome 04/24/2013  . Benign localized prostatic hyperplasia with lower urinary tract symptoms (LUTS) 07/04/2012  . Encounter for long-term current use of medication 07/04/2012  . ED (erectile dysfunction) of organic origin 07/04/2012  . Testicular hypofunction 07/04/2012    Rosalyn Gess OTR/L,CLT 07/26/2018, 11:13 AM  [facility] [address] [place], [place], [place], [address] Phone: [phone]   Fax:  [phone]  Name: ALA CAPRI MRN: [id number] Date of Birth: 08/02/1956

## 2018-08-02 ENCOUNTER — Other Ambulatory Visit: Payer: Self-pay

## 2018-08-02 ENCOUNTER — Ambulatory Visit
Admission: RE | Admit: 2018-08-02 | Discharge: 2018-08-02 | Disposition: A | Discharge status: Home / Self Care | Payer: BLUE CROSS/BLUE SHIELD | Source: Ambulatory Visit | Attending: Radiation Oncology | Admitting: Radiation Oncology

## 2018-08-02 ENCOUNTER — Encounter: Payer: Self-pay | Admitting: Radiation Oncology

## 2018-08-02 DIAGNOSIS — C342 Malignant neoplasm of middle lobe, bronchus or lung: Secondary | ICD-10-CM

## 2018-08-02 NOTE — Progress Notes (Signed)
Radiation Oncology Follow up Note  Name: Paul Carpenter   Date:   08/02/2018 MRN:  [id number] DOB: 1956/06/30    This 62 y.o. male presents to the clinic today for ne-month follow-up status post concurrent chemoradiation therapy for stage IIIB(T1 N3 M0) non-small cell lung cancer of the right lung.  REFERRING PROVIDER: Kirk Ruths, MD  HPI: patient is a 62 year old male now out 1 month having completed concurrent chemoradiation therapy for stage IIIB non-small cell lung cancer of the right lung. He is currently on immunotherapy with. maintenance Imfinzi He is quite weak and washed out breathing is stable no cough hemoptysis or chest tightness.he recently had a repeat CT scan showing bilateral pulmonary nodules unchanged although there was a progressive nodule in the medial left upper lobe which is suspicious.  COMPLICATIONS OF TREATMENT: none  FOLLOW UP COMPLIANCE: keeps appointments   PHYSICAL EXAM:  BP (!) (P) 148/87 (BP Location: Left Arm, Patient Position: Sitting)   Pulse (P) 96   Temp (!) (P) 96.3 F (35.7 C) (Tympanic)   Wt (P) 206 lb 12.7 oz (93.8 kg)   BMI (P) 28.05 kg/m  Well-developed well-nourished patient in NAD. HEENT reveals PERLA, EOMI, discs not visualized.  Oral cavity is clear. No oral mucosal lesions are identified. Neck is clear without evidence of cervical or supraclavicular adenopathy. Lungs are clear to A&P. Cardiac examination is essentially unremarkable with regular rate and rhythm without murmur rub or thrill. Abdomen is benign with no organomegaly or masses noted. Motor sensory and DTR levels are equal and symmetric in the upper and lower extremities. Cranial nerves II through XII are grossly intact. Proprioception is intact. No peripheral adenopathy or edema is identified. No motor or sensory levels are noted. Crude visual fields are within normal range.  RADIOLOGY RESULTS: CT scan is reviewed and compatible with the above-stated findings  PLAN:  present time patient is doing well recovering nicely and is currently on maintenance Imfinzi. He will have a repeat PET CT scan at the end of November [place] shortly thereafter to make further recommendations about possible progressive disease in his chest. Patient will continue on his current treatment course. I will see him back after his PET/CT scan in early December. Patient and wife both copy my treatment plan well.  I would like to take this opportunity to thank you for allowing me to participate in the care of your patient.[name], MD

## 2018-08-08 ENCOUNTER — Telehealth: Payer: Self-pay | Admitting: Internal Medicine

## 2018-08-08 NOTE — Telephone Encounter (Signed)
Pt advised to call his insurance and find out covered alternatives. Pt will come by and pick up [facility] co-pay card. Nothing further needed.

## 2018-08-08 NOTE — Telephone Encounter (Signed)
Pt c/o medication issue:  1. Name of Medication: breo and Spiriva   2. How are you currently taking this medication (dosage and times per day)?   3. Are you having a reaction (difficulty breathing--STAT)? No   4. What is your medication issue? Not affordable patient interested in alternatives

## 2018-08-09 ENCOUNTER — Inpatient Hospital Stay: Payer: BLUE CROSS/BLUE SHIELD

## 2018-08-09 ENCOUNTER — Inpatient Hospital Stay: Payer: BLUE CROSS/BLUE SHIELD | Attending: Oncology

## 2018-08-09 DIAGNOSIS — R002 Palpitations: Secondary | ICD-10-CM | POA: Diagnosis not present

## 2018-08-09 DIAGNOSIS — Z95828 Presence of other vascular implants and grafts: Secondary | ICD-10-CM

## 2018-08-09 DIAGNOSIS — C342 Malignant neoplasm of middle lobe, bronchus or lung: Secondary | ICD-10-CM | POA: Diagnosis not present

## 2018-08-09 DIAGNOSIS — J449 Chronic obstructive pulmonary disease, unspecified: Secondary | ICD-10-CM | POA: Insufficient documentation

## 2018-08-09 DIAGNOSIS — R5381 Other malaise: Secondary | ICD-10-CM | POA: Diagnosis not present

## 2018-08-09 DIAGNOSIS — I10 Essential (primary) hypertension: Secondary | ICD-10-CM | POA: Diagnosis not present

## 2018-08-09 DIAGNOSIS — G4733 Obstructive sleep apnea (adult) (pediatric): Secondary | ICD-10-CM | POA: Insufficient documentation

## 2018-08-09 DIAGNOSIS — R42 Dizziness and giddiness: Secondary | ICD-10-CM | POA: Insufficient documentation

## 2018-08-09 DIAGNOSIS — Z79899 Other long term (current) drug therapy: Secondary | ICD-10-CM | POA: Diagnosis not present

## 2018-08-09 DIAGNOSIS — I7300 Raynaud's syndrome without gangrene: Secondary | ICD-10-CM | POA: Insufficient documentation

## 2018-08-09 DIAGNOSIS — Z9181 History of falling: Secondary | ICD-10-CM | POA: Insufficient documentation

## 2018-08-09 DIAGNOSIS — C3491 Malignant neoplasm of unspecified part of right bronchus or lung: Secondary | ICD-10-CM

## 2018-08-09 DIAGNOSIS — D751 Secondary polycythemia: Secondary | ICD-10-CM | POA: Diagnosis not present

## 2018-08-09 DIAGNOSIS — Z5112 Encounter for antineoplastic immunotherapy: Secondary | ICD-10-CM | POA: Insufficient documentation

## 2018-08-09 DIAGNOSIS — R5383 Other fatigue: Secondary | ICD-10-CM | POA: Diagnosis not present

## 2018-08-09 DIAGNOSIS — F17210 Nicotine dependence, cigarettes, uncomplicated: Secondary | ICD-10-CM | POA: Diagnosis not present

## 2018-08-09 DIAGNOSIS — I2510 Atherosclerotic heart disease of native coronary artery without angina pectoris: Secondary | ICD-10-CM | POA: Diagnosis not present

## 2018-08-09 DIAGNOSIS — M25552 Pain in left hip: Secondary | ICD-10-CM | POA: Diagnosis not present

## 2018-08-09 DIAGNOSIS — K219 Gastro-esophageal reflux disease without esophagitis: Secondary | ICD-10-CM | POA: Insufficient documentation

## 2018-08-09 DIAGNOSIS — E871 Hypo-osmolality and hyponatremia: Secondary | ICD-10-CM | POA: Insufficient documentation

## 2018-08-09 DIAGNOSIS — Z8042 Family history of malignant neoplasm of prostate: Secondary | ICD-10-CM | POA: Insufficient documentation

## 2018-08-09 DIAGNOSIS — Z9989 Dependence on other enabling machines and devices: Secondary | ICD-10-CM | POA: Insufficient documentation

## 2018-08-09 DIAGNOSIS — C7931 Secondary malignant neoplasm of brain: Secondary | ICD-10-CM | POA: Diagnosis not present

## 2018-08-09 DIAGNOSIS — Z923 Personal history of irradiation: Secondary | ICD-10-CM | POA: Insufficient documentation

## 2018-08-09 LAB — COMPREHENSIVE METABOLIC PANEL
ALK PHOS: 90 U/L (ref 38–126)
ALT: 32 U/L (ref 0–44)
AST: 25 U/L (ref 15–41)
Albumin: 4.2 g/dL (ref 3.5–5.0)
Anion gap: 10 (ref 5–15)
BILIRUBIN TOTAL: 0.6 mg/dL (ref 0.3–1.2)
BUN: 7 mg/dL — ABNORMAL LOW (ref 8–23)
CALCIUM: 9.4 mg/dL (ref 8.9–10.3)
CO2: 25 mmol/L (ref 22–32)
CREATININE: 0.77 mg/dL (ref 0.61–1.24)
Chloride: 97 mmol/L — ABNORMAL LOW (ref 98–111)
GFR calc non Af Amer: >60 mL/min (ref >60)
Glucose, Bld: 104 mg/dL — ABNORMAL HIGH (ref 70–99)
Potassium: 4.1 mmol/L (ref 3.5–5.1)
SODIUM: 132 mmol/L — AB (ref 135–145)
Total Protein: 7.1 g/dL (ref 6.5–8.1)

## 2018-08-09 LAB — CBC WITH DIFFERENTIAL/PLATELET
Abs Immature Granulocytes: 0.05 10*3/uL (ref 0.00–0.07)
BASOS ABS: 0 10*3/uL (ref 0.0–0.1)
Basophils Relative: 0 %
EOS ABS: 0 10*3/uL (ref 0.0–0.5)
EOS PCT: 0 %
HEMATOCRIT: 39.3 % (ref 39.0–52.0)
Hemoglobin: 13.7 g/dL (ref 13.0–17.0)
Immature Granulocytes: 1 %
LYMPHS ABS: 0.8 10*3/uL (ref 0.7–4.0)
Lymphocytes Relative: 9 %
MCH: 36.1 pg — ABNORMAL HIGH (ref 26.0–34.0)
MCHC: 34.9 g/dL (ref 30.0–36.0)
MCV: 103.7 fL — AB (ref 80.0–100.0)
MONOS PCT: 8 %
Monocytes Absolute: 0.8 10*3/uL (ref 0.1–1.0)
NRBC: 0 % (ref 0.0–0.2)
Neutro Abs: 7.5 10*3/uL (ref 1.7–7.7)
Neutrophils Relative %: 82 %
Platelets: 203 10*3/uL (ref 150–400)
RBC: 3.79 MIL/uL — ABNORMAL LOW (ref 4.22–5.81)
RDW: 14.2 % (ref 11.5–15.5)
WBC: 9.2 10*3/uL (ref 4.0–10.5)

## 2018-08-09 MED ORDER — SODIUM CHLORIDE 0.9 % IV SOLN: 10.0000 mg | Freq: Once | INTRAVENOUS | Status: DC

## 2018-08-09 MED ORDER — HEPARIN SOD (PORK) LOCK FLUSH 100 UNIT/ML IV SOLN
500.0000 [IU] | Freq: Once | INTRAVENOUS | Status: AC
  Administered 2018-08-09: 500 [IU] via INTRAVENOUS
  Filled 2018-08-09: qty 5

## 2018-08-09 MED ORDER — SODIUM CHLORIDE 0.9 % IV SOLN
1000.0000 mg | Freq: Once | INTRAVENOUS | Status: AC
  Administered 2018-08-09: 1000 mg via INTRAVENOUS
  Filled 2018-08-09: qty 20

## 2018-08-09 MED ORDER — DIPHENHYDRAMINE HCL 50 MG/ML IJ SOLN
25.0000 mg | Freq: Once | INTRAMUSCULAR | Status: AC
  Administered 2018-08-09: 25 mg via INTRAVENOUS
  Filled 2018-08-09: qty 1

## 2018-08-09 MED ORDER — SODIUM CHLORIDE 0.9 % IV SOLN
Freq: Once | INTRAVENOUS | Status: AC
  Administered 2018-08-09: 14:00:00 via INTRAVENOUS
  Filled 2018-08-09: qty 250

## 2018-08-09 MED ORDER — SODIUM CHLORIDE 0.9% FLUSH
10.0000 mL | Freq: Once | INTRAVENOUS | Status: AC
  Administered 2018-08-09: 10 mL via INTRAVENOUS
  Filled 2018-08-09: qty 10

## 2018-08-09 MED ORDER — HEPARIN SOD (PORK) LOCK FLUSH 100 UNIT/ML IV SOLN: 500.0000 [IU] | Freq: Once | INTRAVENOUS | Status: DC | PRN

## 2018-08-09 MED ORDER — DEXAMETHASONE SODIUM PHOSPHATE 10 MG/ML IJ SOLN
10.0000 mg | Freq: Once | INTRAMUSCULAR | Status: AC
  Administered 2018-08-09: 10 mg via INTRAVENOUS
  Filled 2018-08-09: qty 1

## 2018-08-21 ENCOUNTER — Other Ambulatory Visit: Payer: Self-pay | Admitting: Oncology

## 2018-08-23 ENCOUNTER — Inpatient Hospital Stay (HOSPITAL_BASED_OUTPATIENT_CLINIC_OR_DEPARTMENT_OTHER): Payer: BLUE CROSS/BLUE SHIELD | Admitting: Oncology

## 2018-08-23 ENCOUNTER — Ambulatory Visit
Admission: RE | Admit: 2018-08-23 | Discharge: 2018-08-23 | Disposition: A | Discharge status: Home / Self Care | Payer: BLUE CROSS/BLUE SHIELD | Source: Ambulatory Visit | Attending: Oncology | Admitting: Oncology

## 2018-08-23 ENCOUNTER — Inpatient Hospital Stay: Payer: BLUE CROSS/BLUE SHIELD

## 2018-08-23 ENCOUNTER — Ambulatory Visit: Admission: RE | Admit: 2018-08-23 | Payer: BLUE CROSS/BLUE SHIELD | Source: Ambulatory Visit

## 2018-08-23 VITALS — BP 125/84 | HR 104 | Temp 96.7°F | Resp 18 | Wt 205.0 lb

## 2018-08-23 DIAGNOSIS — Z923 Personal history of irradiation: Secondary | ICD-10-CM

## 2018-08-23 DIAGNOSIS — R062 Wheezing: Secondary | ICD-10-CM | POA: Diagnosis present

## 2018-08-23 DIAGNOSIS — J9811 Atelectasis: Secondary | ICD-10-CM | POA: Insufficient documentation

## 2018-08-23 DIAGNOSIS — C3491 Malignant neoplasm of unspecified part of right bronchus or lung: Secondary | ICD-10-CM

## 2018-08-23 DIAGNOSIS — D751 Secondary polycythemia: Secondary | ICD-10-CM | POA: Diagnosis not present

## 2018-08-23 DIAGNOSIS — Z5112 Encounter for antineoplastic immunotherapy: Secondary | ICD-10-CM | POA: Diagnosis not present

## 2018-08-23 DIAGNOSIS — C342 Malignant neoplasm of middle lobe, bronchus or lung: Secondary | ICD-10-CM

## 2018-08-23 DIAGNOSIS — J449 Chronic obstructive pulmonary disease, unspecified: Secondary | ICD-10-CM

## 2018-08-23 DIAGNOSIS — R002 Palpitations: Secondary | ICD-10-CM

## 2018-08-23 DIAGNOSIS — M25552 Pain in left hip: Secondary | ICD-10-CM

## 2018-08-23 DIAGNOSIS — R42 Dizziness and giddiness: Secondary | ICD-10-CM

## 2018-08-23 DIAGNOSIS — F17210 Nicotine dependence, cigarettes, uncomplicated: Secondary | ICD-10-CM

## 2018-08-23 DIAGNOSIS — R110 Nausea: Secondary | ICD-10-CM

## 2018-08-23 DIAGNOSIS — R05 Cough: Secondary | ICD-10-CM

## 2018-08-23 DIAGNOSIS — E871 Hypo-osmolality and hyponatremia: Secondary | ICD-10-CM

## 2018-08-23 DIAGNOSIS — Z9181 History of falling: Secondary | ICD-10-CM

## 2018-08-23 LAB — CBC WITH DIFFERENTIAL/PLATELET
Abs Immature Granulocytes: 0.03 10*3/uL (ref 0.00–0.07)
BASOS PCT: 1 %
Basophils Absolute: 0 10*3/uL (ref 0.0–0.1)
EOS ABS: 0.1 10*3/uL (ref 0.0–0.5)
EOS PCT: 2 %
HCT: 37.6 % — ABNORMAL LOW (ref 39.0–52.0)
Hemoglobin: 13.2 g/dL (ref 13.0–17.0)
Immature Granulocytes: 0 %
Lymphocytes Relative: 7 %
Lymphs Abs: 0.6 10*3/uL — ABNORMAL LOW (ref 0.7–4.0)
MCH: 36.4 pg — AB (ref 26.0–34.0)
MCHC: 35.1 g/dL (ref 30.0–36.0)
MCV: 103.6 fL — AB (ref 80.0–100.0)
MONO ABS: 0.7 10*3/uL (ref 0.1–1.0)
MONOS PCT: 8 %
Neutro Abs: 7.1 10*3/uL (ref 1.7–7.7)
Neutrophils Relative %: 82 %
PLATELETS: 207 10*3/uL (ref 150–400)
RBC: 3.63 MIL/uL — ABNORMAL LOW (ref 4.22–5.81)
RDW: 12.5 % (ref 11.5–15.5)
WBC: 8.7 10*3/uL (ref 4.0–10.5)
nRBC: 0 % (ref 0.0–0.2)

## 2018-08-23 LAB — COMPREHENSIVE METABOLIC PANEL
ALT: 18 U/L (ref 0–44)
AST: 24 U/L (ref 15–41)
Albumin: 3.9 g/dL (ref 3.5–5.0)
Alkaline Phosphatase: 91 U/L (ref 38–126)
Anion gap: 8 (ref 5–15)
BILIRUBIN TOTAL: 0.6 mg/dL (ref 0.3–1.2)
BUN: 8 mg/dL (ref 8–23)
CHLORIDE: 101 mmol/L (ref 98–111)
CO2: 25 mmol/L (ref 22–32)
CREATININE: 0.77 mg/dL (ref 0.61–1.24)
Calcium: 9.2 mg/dL (ref 8.9–10.3)
Glucose, Bld: 121 mg/dL — ABNORMAL HIGH (ref 70–99)
POTASSIUM: 3.8 mmol/L (ref 3.5–5.1)
Sodium: 134 mmol/L — ABNORMAL LOW (ref 135–145)
TOTAL PROTEIN: 6.9 g/dL (ref 6.5–8.1)

## 2018-08-23 MED ORDER — ALBUTEROL SULFATE HFA 108 (90 BASE) MCG/ACT IN AERS: 2.0000 | INHALATION_SPRAY | RESPIRATORY_TRACT | 1 refills | Status: DC | PRN

## 2018-08-23 MED ORDER — DEXAMETHASONE SODIUM PHOSPHATE 10 MG/ML IJ SOLN
5.0000 mg | Freq: Once | INTRAMUSCULAR | Status: AC
  Administered 2018-08-23: 5 mg via INTRAVENOUS
  Filled 2018-08-23: qty 1

## 2018-08-23 MED ORDER — SODIUM CHLORIDE 0.9 % IV SOLN
Freq: Once | INTRAVENOUS | Status: AC
  Administered 2018-08-23: 10:00:00 via INTRAVENOUS
  Filled 2018-08-23: qty 250

## 2018-08-23 MED ORDER — SODIUM CHLORIDE 0.9 % IV SOLN: 5.0000 mg | Freq: Once | INTRAVENOUS | Status: DC

## 2018-08-23 MED ORDER — HEPARIN SOD (PORK) LOCK FLUSH 100 UNIT/ML IV SOLN
INTRAVENOUS | Status: AC
  Filled 2018-08-23: qty 5

## 2018-08-23 MED ORDER — ONDANSETRON HCL 4 MG/2ML IJ SOLN
8.0000 mg | Freq: Once | INTRAMUSCULAR | Status: AC
  Administered 2018-08-23: 8 mg via INTRAVENOUS
  Filled 2018-08-23: qty 4

## 2018-08-23 MED ORDER — DIPHENHYDRAMINE HCL 50 MG/ML IJ SOLN
25.0000 mg | Freq: Once | INTRAMUSCULAR | Status: AC
  Administered 2018-08-23: 25 mg via INTRAVENOUS
  Filled 2018-08-23: qty 1

## 2018-08-23 MED ORDER — SODIUM CHLORIDE 0.9 % IV SOLN
1000.0000 mg | Freq: Once | INTRAVENOUS | Status: AC
  Administered 2018-08-23: 1000 mg via INTRAVENOUS
  Filled 2018-08-23: qty 20

## 2018-08-23 MED ORDER — HEPARIN SOD (PORK) LOCK FLUSH 100 UNIT/ML IV SOLN
500.0000 [IU] | Freq: Once | INTRAVENOUS | Status: AC
  Administered 2018-08-23: 500 [IU] via INTRAVENOUS

## 2018-08-23 NOTE — Progress Notes (Signed)
Paul Carpenter  [phone] [phone]  ID: [name] OB: 1955/12/08  MR#: [id number]  AOZ#:[id number]  Patient Care Team: Kirk Ruths, MD as PCP - General (Internal Medicine)  CHIEF COMPLAINT: Stage IIIa non-small cell lung cancer, right middle lobe.  INTERVAL HISTORY: Patient returns to clinic today for further evaluation and consideration of cycle 4 of maintenance Imfinzi.  He continues to complain of worsening fatigue.  States some days "I do not want to get out of bed".  Additional complaints include dry hacking cough and intermittent dizziness while standing.  He also fell getting out of the bathtub several weeks ago injuring his left hip.  States the pain is so bad "it sometimes wakes him up in the middle of the night".  Has intermittent palpitations that have been going on for several years.  These are unchanged.  He is anxious.  He denies fevers, illnesses, dysphasia, chest pain, hemoptysis or shortness of breath.  He denies any nausea, vomiting, constipation or diarrhea.  He denies urinary complaints.   REVIEW OF SYSTEMS:   Review of Systems  Constitutional: Positive for malaise/fatigue. Negative for chills, fever and weight loss.  HENT: Negative for congestion, ear pain and tinnitus.   Eyes: Negative.  Negative for blurred vision and double vision.  Respiratory: Positive for cough. Negative for sputum production and shortness of breath.   Cardiovascular: Positive for palpitations. Negative for chest pain and leg swelling.  Gastrointestinal: Negative.  Negative for abdominal pain, constipation, diarrhea, nausea and vomiting.  Genitourinary: Negative for dysuria, frequency and urgency.  Musculoskeletal: Negative for back pain and falls.  Skin: Negative.  Negative for rash.  Neurological: Positive for dizziness and weakness. Negative for headaches.  Endo/Heme/Allergies: Negative.  Does not bruise/bleed easily.    Psychiatric/Behavioral: Negative.  Negative for depression. The patient is not nervous/anxious and does not have insomnia.     As per HPI. Otherwise, a complete review of systems is negative.  PAST MEDICAL HISTORY: Past Medical History:  Diagnosis Date  . Anginal pain ([place])   . Anxiety   . Chest pain   . Chicken pox   . Complication of anesthesia    o2 dropped after neck fusion  . COPD (chronic obstructive pulmonary disease) ([place])   . Coronary artery disease   . Cough    chronic  clear phlegm  . Dysrhythmia    palpitations  . GERD (gastroesophageal reflux disease)    h/o reflux/ hoarsness  . Hematochezia   . Hemorrhoids   . History of chickenpox   . History of colon polyps   . History of Helicobacter pylori infection   . Hoarseness   . Hypertension   . Lung cancer ([place]) 05/2016   Chemo + rad tx's.   . Migraines   . OSA (obstructive sleep apnea)    has CPAP but does not use  . Personal history of tobacco use, presenting hazards to health 03/05/2016  . Pneumonia    5/17  . Raynaud disease   . Raynaud disease   . Raynaud's disease   . Rotator cuff tear    on right  . Shortness of breath dyspnea   . Sleep apnea   . Ulcer (traumatic) of oral mucosa     PAST SURGICAL HISTORY: Past Surgical History:  Procedure Laterality Date  . BACK SURGERY     cervical fusion x 2  . CARDIAC CATHETERIZATION    . CERVICAL DISCECTOMY     x 2  .  COLONOSCOPY    . COLONOSCOPY N/A 07/25/2015   Procedure: COLONOSCOPY;  Surgeon: Lollie Sails, MD;  Location: [facility] ENDOSCOPY;  Service: Endoscopy;  Laterality: N/A;  . COLONOSCOPY WITH PROPOFOL N/A 10/04/2017   Procedure: COLONOSCOPY WITH PROPOFOL;  Surgeon: Lollie Sails, MD;  Location: [facility] ENDOSCOPY;  Service: Endoscopy;  Laterality: N/A;  . ELECTROMAGNETIC NAVIGATION BROCHOSCOPY Left 06/28/2016   Procedure: ELECTROMAGNETIC NAVIGATION BRONCHOSCOPY;  Surgeon: Vilinda Boehringer, MD;  Location: [facility] ORS;  Service: Cardiopulmonary;   Laterality: Left;  . ENDOBRONCHIAL ULTRASOUND N/A 04/11/2018   Procedure: ENDOBRONCHIAL ULTRASOUND;  Surgeon: [name], MD;  Location: [facility] ORS;  Service: Cardiopulmonary;  Laterality: N/A;  . ESOPHAGOGASTRODUODENOSCOPY N/A 07/25/2015   Procedure: ESOPHAGOGASTRODUODENOSCOPY (EGD);  Surgeon: Lollie Sails, MD;  Location: [facility] ENDOSCOPY;  Service: Endoscopy;  Laterality: N/A;  . NASAL SINUS SURGERY     x 2   . PORTA CATH INSERTION N/A 04/24/2018   Procedure: PORTA CATH INSERTION;  Surgeon: Algernon Huxley, MD;  Location: [place] CV LAB;  Service: Cardiovascular;  Laterality: N/A;  . rotator cuff surgery Right    07/2016  . SEPTOPLASTY    . SKIN GRAFT      FAMILY HISTORY Family History  Problem Relation Age of Onset  . Heart disease Father   . Prostate cancer Father   . Heart disease Paternal Grandmother   . Heart attack Maternal Grandfather 52  . Kidney cancer Neg Hx   . Bladder Cancer Neg Hx   . Other Neg Hx        pituitary abnormality       ADVANCED DIRECTIVES:    HEALTH MAINTENANCE: Social History   Tobacco Use  . Smoking status: Current Every Day Smoker    Packs/day: 1.50    Years: 45.00    Pack years: 67.50    Types: Cigarettes  . Smokeless tobacco: Never Used  Substance Use Topics  . Alcohol use: Yes    Alcohol/week: 2.0 standard drinks    Types: 2 Standard drinks or equivalent per week    Comment: moderate  . Drug use: No     Allergies  Allergen Reactions  . Lisinopril Rash  . Varenicline Rash    Current Outpatient Medications  Medication Sig Dispense Refill  . acetaminophen (TYLENOL) 500 MG tablet Take 1,000 mg by mouth every 6 (six) hours as needed (for pain.).    Marland Kitchen albuterol (PROAIR HFA) 108 (90 Base) MCG/ACT inhaler Inhale 2 puffs into the lungs every 6 (six) hours as needed for wheezing or shortness of breath. (Patient not taking: Reported on 07/26/2018) 3 Inhaler 1  . atorvastatin (LIPITOR) 40 MG tablet Take 40 mg by mouth at bedtime.      Marland Kitchen BREO ELLIPTA 200-25 MCG/INH AEPB TAKE 1 PUFF BY MOUTH EVERY DAY 60 each 6  . busPIRone (BUSPAR) 15 MG tablet Take 15 mg by mouth 2 (two) times daily.   10  . diazepam (VALIUM) 5 MG tablet Take 5 mg by mouth every 12 (twelve) hours as needed for anxiety (SCHEDULED EVERY NIGHT).     . DULoxetine (CYMBALTA) 60 MG capsule Take 60 mg by mouth daily.     . fluticasone (FLONASE) 50 MCG/ACT nasal spray Place 1 spray into both nostrils daily as needed (for allergies.).     Marland Kitchen ipratropium-albuterol (DUONEB) 0.5-2.5 (3) MG/3ML SOLN Take 3 mLs by nebulization every 6 (six) hours. 360 mL 5  . lidocaine (XYLOCAINE) 2 % solution   2  . magic mouthwash w/lidocaine SOLN  Take 5 mLs by mouth 3 (three) times daily as needed for mouth pain.    . metoprolol tartrate (LOPRESSOR) 50 MG tablet Take 50 mg by mouth 2 (two) times daily.    . mirabegron ER (MYRBETRIQ) 50 MG TB24 tablet Take 1 tablet (50 mg total) by mouth daily. 90 tablet 3  . Multiple Vitamin (MULTIVITAMIN WITH MINERALS) TABS tablet Take 1 tablet by mouth daily.     . nicotine (NICODERM CQ - DOSED IN MG/24 HOURS) 21 mg/24hr patch Place 1 patch (21 mg total) onto the skin daily. 30 patch 12  . nitroGLYCERIN (NITROSTAT) 0.4 MG SL tablet Place 0.4 mg under the tongue every 5 (five) minutes as needed for chest pain.     . OXYGEN Inhale 2 L into the lungs at bedtime.     . pantoprazole (PROTONIX) 40 MG tablet Take 40 mg by mouth daily.  3  . potassium chloride (K-DUR) 10 MEQ tablet TAKE 1 TABLET (10 MEQ TOTAL) BY MOUTH ONCE DAILY.  3  . SPIRIVA RESPIMAT 2.5 MCG/ACT AERS INHALE 2 PUFFS INTO THE LUNGS DAILY. 4 g 6   No current facility-administered medications for this visit.     OBJECTIVE: There were no vitals filed for this visit.   There is no height or weight on file to calculate BMI.    ECOG FS:0 - Asymptomatic  Physical Exam  Constitutional: He is oriented to person, place, and time. Vital signs are normal. He appears well-developed and  well-nourished.  HENT:  Head: Normocephalic and atraumatic.  Eyes: Pupils are equal, round, and reactive to light.  Neck: Normal range of motion.  Cardiovascular: Normal rate, regular rhythm and normal heart sounds.  No murmur heard. Pulmonary/Chest: Effort normal and breath sounds normal. He has no wheezes.  Abdominal: Soft. Normal appearance and bowel sounds are normal. He exhibits no distension. There is no tenderness.  Musculoskeletal: Normal range of motion. He exhibits no edema.  Neurological: He is alert and oriented to person, place, and time.  Skin: Skin is warm and dry. No rash noted.  Psychiatric: Judgment normal.  Nursing note and vitals reviewed.   LAB RESULTS:  Lab Results  Component Value Date   NA 134 (L) 08/23/2018   K 3.8 08/23/2018   CL 101 08/23/2018   CO2 25 08/23/2018   GLUCOSE 121 (H) 08/23/2018   BUN 8 08/23/2018   CREATININE 0.77 08/23/2018   CALCIUM 9.2 08/23/2018   PROT 6.9 08/23/2018   ALBUMIN 3.9 08/23/2018   AST 24 08/23/2018   ALT 18 08/23/2018   ALKPHOS 91 08/23/2018   BILITOT 0.6 08/23/2018   GFRNONAA >60 08/23/2018   GFRAA >60 08/23/2018    Lab Results  Component Value Date   WBC 8.7 08/23/2018   NEUTROABS 7.1 08/23/2018   HGB 13.2 08/23/2018   HCT 37.6 (L) 08/23/2018   MCV 103.6 (H) 08/23/2018   PLT 207 08/23/2018     STUDIES:  ASSESSMENT: Stage IIIa non-small cell lung cancer, right middle lobe.  PLAN:    1. Stage IIIa non-small cell lung cancer, right middle lobe: Case discussed with pathologist and unable to determine whether this is adenocarcinoma or squamous carcinoma.  There is also insufficient tissue to do further testing.  Liquid biopsy did not reveal any actionable mutations.  PET scan was denied by insurance, therefore proceeded with CT scan on July 11, 2018.  Results reviewed independently revealing a progressive nodule in the medial left upper lobe is suspicious but not definitive of  malignancy.  The remainder  of his known disease is grossly unchanged. MRI of the brain completed on Mar 28, 2018 reviewed independently did not reveal metastatic disease.  Patient completed XRT June 26, 2018.  He completed his concurrent single agent carboplatinum on June 21, 2018.  Patient had a reaction to Taxol during cycle 1 and this was discontinued.  Patient initiated maintenance Imfinzi on July 12, 2018.  Proceed today with cycle 4 of maintenance Imfinzi.  Return to clinic in 2 weeks for treatment only and then in 4 weeks for evaluation and consideration of cycle 6. 2.  Cough: Dry cough x2 weeks.  History of COPD.  Given he is on immunotherapy, will get stat chest x-ray to rule out pneumonitis.  Results: Negative for acute abnormality.  Notes pleural parenchymal thickening most likely related to radiation. 3.  Secondary polycythemia: d/t heavy tobacco use.  He still smokes. 4.  Palpitations: Chronic and stable.  Normal sinus rhythm with auscultation today.  Given previous hypertension, he has self adjusted dose of metoprolol.  He is currently taking 25 mg daily.  Heart rate slightly elevated today.  Encouraged them to continue to monitor his blood pressures and heart rate closely. 5.  Dizziness: Unclear etiology.  Could be related to low blood pressure.  Not orthostatic in clinic today. 6.  Left hip pain: d/t recent fall.  Will get MRI of left hip to rule out injury/inflammation.  Continue pain medication as prescribed. Enc 7.  Hyponatremia: Improving.  Sodium level 134 today.   Greater than 50% was spent in counseling and coordination of care with this patient including but not limited to discussion of the relevant topics above (See A&P) including, but not limited to diagnosis and management of acute and chronic medical conditions.   Patient expressed understanding and was in agreement with this plan. He also understands that He can call clinic at any time with any questions, concerns, or complaints.    Jacquelin Hawking, NP   08/23/2018 8:46 AM

## 2018-08-23 NOTE — Progress Notes (Signed)
Patient to receive treatment today per Dr. Grayland Ormond

## 2018-08-28 ENCOUNTER — Ambulatory Visit: Payer: BLUE CROSS/BLUE SHIELD | Admitting: Internal Medicine

## 2018-08-28 ENCOUNTER — Encounter: Payer: Self-pay | Admitting: Internal Medicine

## 2018-08-28 VITALS — BP 142/80 | HR 110 | Ht 72.0 in | Wt 205.2 lb

## 2018-08-28 DIAGNOSIS — F17210 Nicotine dependence, cigarettes, uncomplicated: Secondary | ICD-10-CM | POA: Diagnosis not present

## 2018-08-28 DIAGNOSIS — J449 Chronic obstructive pulmonary disease, unspecified: Secondary | ICD-10-CM

## 2018-08-28 MED ORDER — UMECLIDINIUM-VILANTEROL 62.5-25 MCG/INH IN AEPB: 1.0000 | INHALATION_SPRAY | Freq: Every day | RESPIRATORY_TRACT | 10 refills | Status: DC

## 2018-08-28 MED ORDER — UMECLIDINIUM-VILANTEROL 62.5-25 MCG/INH IN AEPB: 1.0000 | INHALATION_SPRAY | Freq: Every day | RESPIRATORY_TRACT | 0 refills | Status: DC

## 2018-08-28 NOTE — Addendum Note (Signed)
Addended by: Darreld Mclean on: 08/28/2018 12:37 PM   Modules accepted: Orders

## 2018-08-28 NOTE — Progress Notes (Signed)
[place] [facility] Consultation      MRN# [id number] KAIYDEN SIMKIN February 07, 1956   CC: Follow up COPD   Brief History: Synopsis: Alfonse Garringer first saw the [facility] pulmonary clinic in July of 2014 for COPD. Simple spirometry showed a ratio 62%, FEV1 of 2.41 L (60% predicted). He had smoked 2 packs of cigarettes daily since his teenage years and continues to smoke. He has had multiple sinus infections and 2 sinus surgeries in the past.. LUL lung lesion negative for malignancy based on ENB 06/2016. 04/2017 PFT's shows ratio 62 with Fev1 68% 2.6L with air trapping and hyperinflation  Mass of upper lobe of left lung ENB of LUL lesion negative for malignant cell in 06/2016. Repeat CT chest in 08/2016 with RUL small scarring lesion, this is new.  Had another CTA Chest 09/09/16 for severe dyspnea - no PE, but LUL PNA CT chest 03/2017- -Apical right upper lobe 5 mm pulmonary nodule stable back to 03/14/2015 - 6 mm apical right upper lobe pulmonary nodule stable since at least 03/22/2016.  -tree-in-bud type opacity in the anterior right middle lobe is stable since 08/23/2016 -Apical left upper lobe 9 x 5 mm  nodule is stable since at least 03/22/2016  - clinical monitoring at this time-follow up CT chest in 6-12 months      HPI Patient here for follow-up COPD Patient states that he cannot afford his Breo anymore However he does not seem like to be helping him Currently on Spiriva Respimat which states that this helps his breathing  Patient continues to smoke daily Smoking cessation counseling provided  Patient had history of COPD exacerbation approximately 2 weeks ago Was given prednisone and antibiotics His wheezing and cough improved  No signs of infection at this time  Patient has an ONO positive for hypoxia Patient does not use his oxygen on a nightly basis due to the fact that he has problems sleeping with it    Smoking Assessment and Cessation  Counseling   Upon further questioning, Patient smokes 1 ppd  I have advised patient to quit/stop smoking as soon as possible due to high risk for multiple medical problems  Patient is is NOT willing to quit smoking  I have advised patient that we can assist and have options of Nicotine replacement therapy. I also advised patient on behavioral therapy and can provide oral medication therapy in conjunction with the other therapies  Follow up next Office visit  for assessment of smoking cessation  Smoking cessation counseling advised for 4 minutes   In June of this year patient had subcarinal lymph node that was positive PET scan He was performed shows metastatic non-small cell lung cancer  Patient undergoing chemo and radiation therapy patient had underwent 36 radiation therapy cycles Plans to see oncology in couple months for follow-up      Current Outpatient Medications:  .  acetaminophen (TYLENOL) 500 MG tablet, Take 1,000 mg by mouth every 6 (six) hours as needed (for pain.)., Disp: , Rfl:  .  albuterol (PROAIR HFA) 108 (90 Base) MCG/ACT inhaler, Inhale 2 puffs into the lungs every 4 (four) hours as needed for wheezing or shortness of breath., Disp: 1 Inhaler, Rfl: 1 .  atorvastatin (LIPITOR) 40 MG tablet, Take 40 mg by mouth at bedtime. , Disp: , Rfl:  .  BREO ELLIPTA 200-25 MCG/INH AEPB, TAKE 1 PUFF BY MOUTH EVERY DAY, Disp: 60 each, Rfl: 6 .  busPIRone (BUSPAR) 15 MG tablet, Take 15 mg by  mouth 2 (two) times daily. , Disp: , Rfl: 10 .  diazepam (VALIUM) 5 MG tablet, Take 5 mg by mouth every 12 (twelve) hours as needed for anxiety (SCHEDULED EVERY NIGHT). , Disp: , Rfl:  .  DULoxetine (CYMBALTA) 60 MG capsule, Take 60 mg by mouth daily. , Disp: , Rfl:  .  fluticasone (FLONASE) 50 MCG/ACT nasal spray, Place 1 spray into both nostrils daily as needed (for allergies.). , Disp: , Rfl:  .  ipratropium-albuterol (DUONEB) 0.5-2.5 (3) MG/3ML SOLN, Take 3 mLs by nebulization every 6  (six) hours., Disp: 360 mL, Rfl: 5 .  lidocaine (XYLOCAINE) 2 % solution, , Disp: , Rfl: 2 .  magic mouthwash w/lidocaine SOLN, Take 5 mLs by mouth 3 (three) times daily as needed for mouth pain., Disp: , Rfl:  .  metoprolol tartrate (LOPRESSOR) 50 MG tablet, Take 50 mg by mouth 2 (two) times daily., Disp: , Rfl:  .  mirabegron ER (MYRBETRIQ) 50 MG TB24 tablet, Take 1 tablet (50 mg total) by mouth daily., Disp: 90 tablet, Rfl: 3 .  Multiple Vitamin (MULTIVITAMIN WITH MINERALS) TABS tablet, Take 1 tablet by mouth daily. , Disp: , Rfl:  .  nicotine (NICODERM CQ - DOSED IN MG/24 HOURS) 21 mg/24hr patch, Place 1 patch (21 mg total) onto the skin daily., Disp: 30 patch, Rfl: 12 .  nitroGLYCERIN (NITROSTAT) 0.4 MG SL tablet, Place 0.4 mg under the tongue every 5 (five) minutes as needed for chest pain. , Disp: , Rfl:  .  ondansetron (ZOFRAN) 8 MG tablet, Take 8 mg by mouth every 8 (eight) hours as needed for nausea or vomiting., Disp: , Rfl:  .  OXYGEN, Inhale 2 L into the lungs at bedtime. , Disp: , Rfl:  .  pantoprazole (PROTONIX) 40 MG tablet, Take 40 mg by mouth daily., Disp: , Rfl: 3 .  potassium chloride (K-DUR) 10 MEQ tablet, TAKE 1 TABLET (10 MEQ TOTAL) BY MOUTH ONCE DAILY., Disp: , Rfl: 3 .  SPIRIVA RESPIMAT 2.5 MCG/ACT AERS, INHALE 2 PUFFS INTO THE LUNGS DAILY., Disp: 4 g, Rfl: 6   Review of Systems  Constitutional: Positive for malaise/fatigue. Negative for chills and fever.  Eyes: Negative for blurred vision.  Respiratory: Positive for shortness of breath. Negative for cough, hemoptysis, sputum production and wheezing.   Cardiovascular: Negative for chest pain.  Gastrointestinal: Negative for heartburn, nausea and vomiting.  Skin: Negative for rash.   OTHER ROS NEGATIVE   Allergies:  Lisinopril and Varenicline  Physical Examination:  Ht 6' (1.829 m)   BMI 27.80 kg/m   BP (!) 142/80 (BP Location: Left Arm, Cuff Size: Normal)   Pulse (!) 110   Ht 6' (1.829 m)   Wt 205 lb 3.2  oz (93.1 kg)   SpO2 99%   BMI 27.83 kg/m   Physical Examination:   GENERAL:NAD, no fevers, chills, no weakness no fatigue HEAD: Normocephalic, atraumatic.  EYES: Pupils equal, round, reactive to light. Extraocular muscles intact. No scleral icterus.  MOUTH: Moist mucosal membrane. Dentition intact. No abscess noted.  EAR, NOSE, THROAT: Clear without exudates. No external lesions.  NECK: Supple. No thyromegaly. No nodules. No JVD.  PULMONARY: CTA B/L no wheezing, rhonchi, crackles CARDIOVASCULAR: S1 and S2. Regular rate and rhythm. No murmurs, rubs, or gallops. No edema. Pedal pulses 2+ bilaterally.  GASTROINTESTINAL: Soft, nontender, nondistended. No masses. Positive bowel sounds. No hepatosplenomegaly.  MUSCULOSKELETAL: No swelling, clubbing, or edema. Range of motion full in all extremities.  NEUROLOGIC: Cranial nerves II  through XII are intact. No gross focal neurological deficits. Sensation intact. Reflexes intact.  SKIN: No ulceration, lesions, rashes, or cyanosis. Skin warm and dry. Turgor intact.  PSYCHIATRIC: Mood, affect within normal limits. The patient is awake, alert and oriented x 3. Insight, judgment intact.  ALL OTHER ROS ARE NEGATIVE       Assessment and Plan:  62 year old white male with moderate COPD Gold stage with chronic hypoxic respiratory failure from his COPD with a right mid benign lung nodules with subcarinal adenopathy consistent with metastatic lung cancer in the setting of ongoing tobacco abuse and ongoing  radiation treatments  #1 shortness of breath and dyspnea exertion multifactorial related to COPD and deconditioned state  #2 moderate COPD Gold stage B Smoking cessation strongly advised Patient states that Memory Dance is too expensive at this time and does not seem to be helping Patient does take Spiriva however after further consideration patient may need combined long-acting anticholinergic as well as long-acting broncho-dilators with beta agonist We will  start ANORO Will stop Breo and stop Spiriva  #3 metastatic non-small cell lung cancer Follow-up oncology Follow-up PET scans and CT scans pending  #4 tobacco abuse Smoking cessation counseling provided  #5 chronic hypoxic respiratory failure from COPD Patient intermittently uses oxygen but I have recommended that he uses oxygen nightly    Patient  satisfied with Plan of action and management. All questions answered  Corrin Parker, M.D.  [facility]  Medical Director [place] Director [facility] Cardio-Pulmonary Department           I

## 2018-08-28 NOTE — Patient Instructions (Addendum)
STOP BREO STOP SPIRIVA  START ANORO  ALBUTEROL AS NEEDED  NEBS AS NEEDED   FOLLOW UP WITH ONCOLOGY FOR SCANS  PLEASE USE OXYGEN AT NIGHT AS PRESCRIBED    STOP SMOKING!!!

## 2018-09-06 ENCOUNTER — Inpatient Hospital Stay: Payer: BLUE CROSS/BLUE SHIELD | Attending: Oncology

## 2018-09-06 ENCOUNTER — Inpatient Hospital Stay: Payer: BLUE CROSS/BLUE SHIELD

## 2018-09-06 DIAGNOSIS — Z9981 Dependence on supplemental oxygen: Secondary | ICD-10-CM | POA: Insufficient documentation

## 2018-09-06 DIAGNOSIS — I2510 Atherosclerotic heart disease of native coronary artery without angina pectoris: Secondary | ICD-10-CM | POA: Insufficient documentation

## 2018-09-06 DIAGNOSIS — J449 Chronic obstructive pulmonary disease, unspecified: Secondary | ICD-10-CM | POA: Insufficient documentation

## 2018-09-06 DIAGNOSIS — R5383 Other fatigue: Secondary | ICD-10-CM | POA: Diagnosis not present

## 2018-09-06 DIAGNOSIS — E871 Hypo-osmolality and hyponatremia: Secondary | ICD-10-CM | POA: Diagnosis not present

## 2018-09-06 DIAGNOSIS — K219 Gastro-esophageal reflux disease without esophagitis: Secondary | ICD-10-CM | POA: Insufficient documentation

## 2018-09-06 DIAGNOSIS — I10 Essential (primary) hypertension: Secondary | ICD-10-CM | POA: Insufficient documentation

## 2018-09-06 DIAGNOSIS — F419 Anxiety disorder, unspecified: Secondary | ICD-10-CM | POA: Insufficient documentation

## 2018-09-06 DIAGNOSIS — R5381 Other malaise: Secondary | ICD-10-CM | POA: Diagnosis not present

## 2018-09-06 DIAGNOSIS — C342 Malignant neoplasm of middle lobe, bronchus or lung: Secondary | ICD-10-CM | POA: Diagnosis not present

## 2018-09-06 DIAGNOSIS — F17210 Nicotine dependence, cigarettes, uncomplicated: Secondary | ICD-10-CM | POA: Diagnosis not present

## 2018-09-06 DIAGNOSIS — Z86010 Personal history of colonic polyps: Secondary | ICD-10-CM | POA: Diagnosis not present

## 2018-09-06 DIAGNOSIS — Z8042 Family history of malignant neoplasm of prostate: Secondary | ICD-10-CM | POA: Insufficient documentation

## 2018-09-06 DIAGNOSIS — C3491 Malignant neoplasm of unspecified part of right bronchus or lung: Secondary | ICD-10-CM

## 2018-09-06 DIAGNOSIS — Z79899 Other long term (current) drug therapy: Secondary | ICD-10-CM | POA: Insufficient documentation

## 2018-09-06 DIAGNOSIS — Z95828 Presence of other vascular implants and grafts: Secondary | ICD-10-CM

## 2018-09-06 DIAGNOSIS — Z5112 Encounter for antineoplastic immunotherapy: Secondary | ICD-10-CM | POA: Insufficient documentation

## 2018-09-06 DIAGNOSIS — G4733 Obstructive sleep apnea (adult) (pediatric): Secondary | ICD-10-CM | POA: Diagnosis not present

## 2018-09-06 LAB — COMPREHENSIVE METABOLIC PANEL
ALBUMIN: 4 g/dL (ref 3.5–5.0)
ALT: 17 U/L (ref 0–44)
AST: 22 U/L (ref 15–41)
Alkaline Phosphatase: 95 U/L (ref 38–126)
Anion gap: 8 (ref 5–15)
BUN: 7 mg/dL — AB (ref 8–23)
CALCIUM: 9.4 mg/dL (ref 8.9–10.3)
CO2: 24 mmol/L (ref 22–32)
CREATININE: 0.78 mg/dL (ref 0.61–1.24)
Chloride: 96 mmol/L — ABNORMAL LOW (ref 98–111)
GFR calc non Af Amer: >60 mL/min (ref >60)
GLUCOSE: 114 mg/dL — AB (ref 70–99)
Potassium: 4 mmol/L (ref 3.5–5.1)
SODIUM: 128 mmol/L — AB (ref 135–145)
Total Bilirubin: 0.5 mg/dL (ref 0.3–1.2)
Total Protein: 6.9 g/dL (ref 6.5–8.1)

## 2018-09-06 LAB — CBC WITH DIFFERENTIAL/PLATELET
ABS IMMATURE GRANULOCYTES: 0.03 10*3/uL (ref 0.00–0.07)
BASOS PCT: 0 %
Basophils Absolute: 0 10*3/uL (ref 0.0–0.1)
Eosinophils Absolute: 0.2 10*3/uL (ref 0.0–0.5)
Eosinophils Relative: 2 %
HCT: 38.3 % — ABNORMAL LOW (ref 39.0–52.0)
HEMOGLOBIN: 13.2 g/dL (ref 13.0–17.0)
IMMATURE GRANULOCYTES: 0 %
LYMPHS PCT: 7 %
Lymphs Abs: 0.7 10*3/uL (ref 0.7–4.0)
MCH: 35.9 pg — ABNORMAL HIGH (ref 26.0–34.0)
MCHC: 34.5 g/dL (ref 30.0–36.0)
MCV: 104.1 fL — ABNORMAL HIGH (ref 80.0–100.0)
MONO ABS: 0.7 10*3/uL (ref 0.1–1.0)
MONOS PCT: 7 %
NEUTROS ABS: 8.2 10*3/uL — AB (ref 1.7–7.7)
NEUTROS PCT: 84 %
PLATELETS: 210 10*3/uL (ref 150–400)
RBC: 3.68 MIL/uL — AB (ref 4.22–5.81)
RDW: 11.9 % (ref 11.5–15.5)
WBC: 9.8 10*3/uL (ref 4.0–10.5)
nRBC: 0 % (ref 0.0–0.2)

## 2018-09-06 MED ORDER — SODIUM CHLORIDE 0.9% FLUSH
10.0000 mL | Freq: Once | INTRAVENOUS | Status: AC
  Administered 2018-09-06: 10 mL via INTRAVENOUS
  Filled 2018-09-06: qty 10

## 2018-09-06 MED ORDER — SODIUM CHLORIDE 0.9 % IV SOLN
1000.0000 mg | Freq: Once | INTRAVENOUS | Status: AC
  Administered 2018-09-06: 1000 mg via INTRAVENOUS
  Filled 2018-09-06: qty 20

## 2018-09-06 MED ORDER — DEXAMETHASONE SODIUM PHOSPHATE 10 MG/ML IJ SOLN
10.0000 mg | Freq: Once | INTRAMUSCULAR | Status: AC
  Administered 2018-09-06: 10 mg via INTRAVENOUS
  Filled 2018-09-06: qty 1

## 2018-09-06 MED ORDER — DIPHENHYDRAMINE HCL 50 MG/ML IJ SOLN
25.0000 mg | Freq: Once | INTRAMUSCULAR | Status: AC
  Administered 2018-09-06: 25 mg via INTRAVENOUS
  Filled 2018-09-06: qty 1

## 2018-09-06 MED ORDER — HEPARIN SOD (PORK) LOCK FLUSH 100 UNIT/ML IV SOLN: 500.0000 [IU] | Freq: Once | INTRAVENOUS | Status: DC

## 2018-09-06 MED ORDER — HEPARIN SOD (PORK) LOCK FLUSH 100 UNIT/ML IV SOLN
500.0000 [IU] | Freq: Once | INTRAVENOUS | Status: AC | PRN
  Administered 2018-09-06: 500 [IU]
  Filled 2018-09-06: qty 5

## 2018-09-06 MED ORDER — SODIUM CHLORIDE 0.9 % IV SOLN
Freq: Once | INTRAVENOUS | Status: AC
  Administered 2018-09-06: 14:00:00 via INTRAVENOUS
  Filled 2018-09-06: qty 250

## 2018-09-07 LAB — THYROID PANEL WITH TSH
FREE THYROXINE INDEX: 2.3 (ref 1.2–4.9)
T3 Uptake Ratio: 25 % (ref 24–39)
T4 TOTAL: 9.1 ug/dL (ref 4.5–12.0)
TSH: 0.79 u[IU]/mL (ref 0.450–4.500)

## 2018-09-08 ENCOUNTER — Ambulatory Visit
Admission: RE | Admit: 2018-09-08 | Discharge: 2018-09-08 | Disposition: A | Discharge status: Home / Self Care | Payer: BLUE CROSS/BLUE SHIELD | Source: Ambulatory Visit | Attending: Oncology | Admitting: Oncology

## 2018-09-08 DIAGNOSIS — M25552 Pain in left hip: Secondary | ICD-10-CM | POA: Insufficient documentation

## 2018-09-08 DIAGNOSIS — M47898 Other spondylosis, sacral and sacrococcygeal region: Secondary | ICD-10-CM | POA: Insufficient documentation

## 2018-09-08 MED ORDER — GADOBUTROL 1 MMOL/ML IV SOLN
9.0000 mL | Freq: Once | INTRAVENOUS | Status: AC | PRN
  Administered 2018-09-08: 9 mL via INTRAVENOUS

## 2018-09-16 NOTE — Progress Notes (Signed)
[place]  Telephone:([phone]) [phone] Fax:(336) [phone]  ID: [name] OB: 03-03-1956  MR#: [id number]  EXB#:[id number]  Patient Care Team: Kirk Ruths, MD as PCP - General (Internal Medicine)  CHIEF COMPLAINT: Stage IIIa non-small cell lung cancer, right middle lobe.  INTERVAL HISTORY: Patient returns to clinic today for further evaluation and consideration of cycle 6 of maintenance Imfinzi.  He has increased fatigue, but otherwise is tolerating his treatments well.  He continues to be anxious.  He has no neurologic complaints. He denies any recent fevers or illnesses.  He does not complain of dysphasia today.  He has no chest pain, cough, hemoptysis, or shortness of breath. He denies any nausea, vomiting, constipation, or diarrhea. He has no urinary complaints.  Patient offers no further specific complaints today.  REVIEW OF SYSTEMS:   Review of Systems  Constitutional: Positive for malaise/fatigue. Negative for fever and weight loss.  HENT: Negative.  Negative for sore throat.   Respiratory: Negative.  Negative for cough, hemoptysis and shortness of breath.   Cardiovascular: Negative.  Negative for chest pain and leg swelling.  Gastrointestinal: Negative.  Negative for abdominal pain, blood in stool and melena.  Genitourinary: Negative.  Negative for dysuria.  Musculoskeletal: Negative.  Negative for joint pain.  Skin: Negative.  Negative for rash.  Neurological: Negative.  Negative for sensory change, focal weakness, weakness and headaches.  Psychiatric/Behavioral: The patient is nervous/anxious.     As per HPI. Otherwise, a complete review of systems is negative.  PAST MEDICAL HISTORY: Past Medical History:  Diagnosis Date  . Anginal pain ([place])   . Anxiety   . Chest pain   . Chicken pox   . Complication of anesthesia    o2 dropped after neck fusion  . COPD (chronic obstructive pulmonary disease) ([place])   . Coronary artery disease   .  Cough    chronic  clear phlegm  . Dysrhythmia    palpitations  . GERD (gastroesophageal reflux disease)    h/o reflux/ hoarsness  . Hematochezia   . Hemorrhoids   . History of chickenpox   . History of colon polyps   . History of Helicobacter pylori infection   . Hoarseness   . Hypertension   . Lung cancer ([place]) 05/2016   Chemo + rad tx's.   . Migraines   . OSA (obstructive sleep apnea)    has CPAP but does not use  . Personal history of tobacco use, presenting hazards to health 03/05/2016  . Pneumonia    5/17  . Raynaud disease   . Raynaud disease   . Raynaud's disease   . Rotator cuff tear    on right  . Shortness of breath dyspnea   . Sleep apnea   . Ulcer (traumatic) of oral mucosa     PAST SURGICAL HISTORY: Past Surgical History:  Procedure Laterality Date  . BACK SURGERY     cervical fusion x 2  . CARDIAC CATHETERIZATION    . CERVICAL DISCECTOMY     x 2  . COLONOSCOPY    . COLONOSCOPY N/A 07/25/2015   Procedure: COLONOSCOPY;  Surgeon: Lollie Sails, MD;  Location: [facility] ENDOSCOPY;  Service: Endoscopy;  Laterality: N/A;  . COLONOSCOPY WITH PROPOFOL N/A 10/04/2017   Procedure: COLONOSCOPY WITH PROPOFOL;  Surgeon: Lollie Sails, MD;  Location: [facility] ENDOSCOPY;  Service: Endoscopy;  Laterality: N/A;  . ELECTROMAGNETIC NAVIGATION BROCHOSCOPY Left 06/28/2016   Procedure: ELECTROMAGNETIC NAVIGATION BRONCHOSCOPY;  Surgeon: Vilinda Boehringer, MD;  Location: [facility] ORS;  Service: Cardiopulmonary;  Laterality: Left;  . ENDOBRONCHIAL ULTRASOUND N/A 04/11/2018   Procedure: ENDOBRONCHIAL ULTRASOUND;  Surgeon: [name], MD;  Location: [facility] ORS;  Service: Cardiopulmonary;  Laterality: N/A;  . ESOPHAGOGASTRODUODENOSCOPY N/A 07/25/2015   Procedure: ESOPHAGOGASTRODUODENOSCOPY (EGD);  Surgeon: Lollie Sails, MD;  Location: [facility] ENDOSCOPY;  Service: Endoscopy;  Laterality: N/A;  . NASAL SINUS SURGERY     x 2   . PORTA CATH INSERTION N/A 04/24/2018   Procedure: PORTA CATH  INSERTION;  Surgeon: Algernon Huxley, MD;  Location: [place] CV LAB;  Service: Cardiovascular;  Laterality: N/A;  . rotator cuff surgery Right    07/2016  . SEPTOPLASTY    . SKIN GRAFT      FAMILY HISTORY Family History  Problem Relation Age of Onset  . Heart disease Father   . Prostate cancer Father   . Heart disease Paternal Grandmother   . Heart attack Maternal Grandfather 52  . Kidney cancer Neg Hx   . Bladder Cancer Neg Hx   . Other Neg Hx        pituitary abnormality       ADVANCED DIRECTIVES:    HEALTH MAINTENANCE: Social History   Tobacco Use  . Smoking status: Current Every Day Smoker    Packs/day: 1.50    Years: 45.00    Pack years: 67.50    Types: Cigarettes  . Smokeless tobacco: Never Used  Substance Use Topics  . Alcohol use: Yes    Alcohol/week: 2.0 standard drinks    Types: 2 Standard drinks or equivalent per week    Comment: moderate  . Drug use: No     Allergies  Allergen Reactions  . Lisinopril Rash  . Varenicline Rash    Current Outpatient Medications  Medication Sig Dispense Refill  . acetaminophen (TYLENOL) 500 MG tablet Take 1,000 mg by mouth every 6 (six) hours as needed (for pain.).    Marland Kitchen albuterol (PROAIR HFA) 108 (90 Base) MCG/ACT inhaler Inhale 2 puffs into the lungs every 4 (four) hours as needed for wheezing or shortness of breath. 1 Inhaler 1  . atorvastatin (LIPITOR) 40 MG tablet Take 40 mg by mouth at bedtime.     Marland Kitchen BREO ELLIPTA 200-25 MCG/INH AEPB TAKE 1 PUFF BY MOUTH EVERY DAY 60 each 6  . busPIRone (BUSPAR) 15 MG tablet Take 15 mg by mouth 2 (two) times daily.   10  . diazepam (VALIUM) 5 MG tablet Take 5 mg by mouth every 12 (twelve) hours as needed for anxiety (SCHEDULED EVERY NIGHT).     . fluticasone (FLONASE) 50 MCG/ACT nasal spray Place 1 spray into both nostrils daily as needed (for allergies.).     Marland Kitchen ipratropium-albuterol (DUONEB) 0.5-2.5 (3) MG/3ML SOLN Take 3 mLs by nebulization every 6 (six) hours. 360 mL 5  .  lidocaine (XYLOCAINE) 2 % solution   2  . magic mouthwash w/lidocaine SOLN Take 5 mLs by mouth 3 (three) times daily as needed for mouth pain.    . metoprolol tartrate (LOPRESSOR) 50 MG tablet Take 50 mg by mouth 2 (two) times daily.    . mirabegron ER (MYRBETRIQ) 50 MG TB24 tablet Take 1 tablet (50 mg total) by mouth daily. 90 tablet 3  . Multiple Vitamin (MULTIVITAMIN WITH MINERALS) TABS tablet Take 1 tablet by mouth daily.     . nicotine (NICODERM CQ - DOSED IN MG/24 HOURS) 21 mg/24hr patch Place 1 patch (21 mg total) onto the skin daily. Ear[place]  patch 12  . ondansetron (ZOFRAN) 8 MG tablet Take 8 mg by mouth every 8 (eight) hours as needed for nausea or vomiting.    . OXYGEN Inhale 2 L into the lungs at bedtime.     . pantoprazole (PROTONIX) 40 MG tablet Take 40 mg by mouth daily.  3  . potassium chloride (K-DUR) 10 MEQ tablet TAKE 1 TABLET (10 MEQ TOTAL) BY MOUTH ONCE DAILY.  3  . SPIRIVA RESPIMAT 2.5 MCG/ACT AERS INHALE 2 PUFFS INTO THE LUNGS DAILY. 4 g 6  . umeclidinium-vilanterol (ANORO ELLIPTA) 62.5-25 MCG/INH AEPB Inhale 1 puff into the lungs daily. 1 each 10  . umeclidinium-vilanterol (ANORO ELLIPTA) 62.5-25 MCG/INH AEPB Inhale 1 puff into the lungs daily. 1 each 0  . DULoxetine (CYMBALTA) 60 MG capsule Take 60 mg by mouth daily.     . nitroGLYCERIN (NITROSTAT) 0.4 MG SL tablet Place 0.4 mg under the tongue every 5 (five) minutes as needed for chest pain.      No current facility-administered medications for this visit.    Facility-Administered Medications Ordered in Other Visits  Medication Dose Route Frequency Provider Last Rate Last Dose  . heparin lock flush 100 unit/mL  500 Units Intravenous Once [name], MD        OBJECTIVE: Vitals:   09/20/18 0955  BP: 139/88  Pulse: 81  Resp: 20  Temp: 97.7 F (36.5 C)     There is no height or weight on file to calculate BMI.    ECOG FS:0 - Asymptomatic  General: Well-developed, well-nourished, no acute distress. Eyes:  Pink conjunctiva, anicteric sclera. HEENT: Normocephalic, moist mucous membranes. Lungs: Clear to auscultation bilaterally. Heart: Regular rate and rhythm. No rubs, murmurs, or gallops. Abdomen: Soft, nontender, nondistended. No organomegaly noted, normoactive bowel sounds. Musculoskeletal: No edema, cyanosis, or clubbing. Neuro: Alert, answering all questions appropriately. Cranial nerves grossly intact. Skin: No rashes or petechiae noted. Psych: Normal affect.  LAB RESULTS:  Lab Results  Component Value Date   NA 134 (L) 09/20/2018   K 3.9 09/20/2018   CL 100 09/20/2018   CO2 26 09/20/2018   GLUCOSE 93 09/20/2018   BUN 7 (L) 09/20/2018   CREATININE 0.71 09/20/2018   CALCIUM 9.2 09/20/2018   PROT 6.9 09/20/2018   ALBUMIN 4.0 09/20/2018   AST 20 09/20/2018   ALT 20 09/20/2018   ALKPHOS 94 09/20/2018   BILITOT 0.5 09/20/2018   GFRNONAA >60 09/20/2018   GFRAA >60 09/20/2018    Lab Results  Component Value Date   WBC 10.5 09/20/2018   NEUTROABS 8.8 (H) 09/20/2018   HGB 13.7 09/20/2018   HCT 39.2 09/20/2018   MCV 102.6 (H) 09/20/2018   PLT 217 09/20/2018     STUDIES:  ASSESSMENT: Stage IIIa non-small cell lung cancer, right middle lobe.  PLAN:    1. Stage IIIa non-small cell lung cancer, right middle lobe: Case discussed with pathologist and unable to determine whether this is adenocarcinoma or squamous carcinoma.  There is also insufficient tissue to do further testing.  Liquid biopsy did not reveal any actionable mutations.  PET scan was denied by insurance, therefore proceeded with CT scan on July 11, 2018.  Results reviewed independently revealing a progressive nodule in the medial left upper lobe is suspicious but not definitive of malignancy.  The remainder of his known disease is grossly unchanged. MRI of the brain completed on Mar 28, 2018 reviewed independently did not reveal metastatic disease.  Patient completed XRT June 26, 2018.  He completed his  concurrent single agent carboplatinum on June 21, 2018.  Patient had a reaction to Taxol during cycle 1 and this was discontinued.  Patient initiated maintenance Imfinzi on July 12, 2018.  Proceed with cycle 6 of treatment today.  Return to clinic in 2 weeks for treatment only and then in 4 weeks for further evaluation and consideration of cycle 8.  Patient has imaging with PET scan scheduled for September 25, 2018. 2.  Secondary polycythemia: Resolved.  Likely secondary to heavy tobacco use.  Previously, the remainder of his laboratory work, including JAK-2 mutation, was either negative or within normal limits.  3. Left upper lobe pulmonary nodule: Imaging results as above.  Previously, biopsy completed on October 04, 2017 was negative for malignancy.   4.  Colon polyps:  Patient has a personal history of greater then 10 adenomatous polyps on his most recent conoloscopy. He does not know of any family history of increased polyps or colon cancer.  Genetic testing to assess for the APC mutation for FAP or AFAP was negative. Continue colonoscopies as per GI. 5. Tobacco Use: Patient continues to heavily smoke.  He expressed understanding by continuing tobacco use increases his chance of recurrence. 6. Anxiety: Chronic.  Continue IV Benadryl with each treatment.  Continue treatment and evaluation per primary care. 7.  Hyponatremia: Chronic and unchanged.  Monitor.  Patient expressed understanding and was in agreement with this plan. He also understands that He can call clinic at any time with any questions, concerns, or complaints.    [name], MD   09/22/2018 1:02 PM

## 2018-09-20 ENCOUNTER — Inpatient Hospital Stay: Payer: BLUE CROSS/BLUE SHIELD

## 2018-09-20 ENCOUNTER — Inpatient Hospital Stay (HOSPITAL_BASED_OUTPATIENT_CLINIC_OR_DEPARTMENT_OTHER): Payer: BLUE CROSS/BLUE SHIELD | Admitting: Oncology

## 2018-09-20 VITALS — BP 139/88 | HR 81 | Temp 97.7°F | Resp 20

## 2018-09-20 DIAGNOSIS — C342 Malignant neoplasm of middle lobe, bronchus or lung: Secondary | ICD-10-CM

## 2018-09-20 DIAGNOSIS — C3491 Malignant neoplasm of unspecified part of right bronchus or lung: Secondary | ICD-10-CM

## 2018-09-20 DIAGNOSIS — R5381 Other malaise: Secondary | ICD-10-CM

## 2018-09-20 DIAGNOSIS — F419 Anxiety disorder, unspecified: Secondary | ICD-10-CM

## 2018-09-20 DIAGNOSIS — E871 Hypo-osmolality and hyponatremia: Secondary | ICD-10-CM

## 2018-09-20 DIAGNOSIS — R5383 Other fatigue: Secondary | ICD-10-CM

## 2018-09-20 DIAGNOSIS — Z79899 Other long term (current) drug therapy: Secondary | ICD-10-CM

## 2018-09-20 DIAGNOSIS — G4733 Obstructive sleep apnea (adult) (pediatric): Secondary | ICD-10-CM

## 2018-09-20 DIAGNOSIS — K219 Gastro-esophageal reflux disease without esophagitis: Secondary | ICD-10-CM

## 2018-09-20 DIAGNOSIS — F17210 Nicotine dependence, cigarettes, uncomplicated: Secondary | ICD-10-CM

## 2018-09-20 DIAGNOSIS — Z86010 Personal history of colonic polyps: Secondary | ICD-10-CM

## 2018-09-20 DIAGNOSIS — Z9981 Dependence on supplemental oxygen: Secondary | ICD-10-CM

## 2018-09-20 DIAGNOSIS — I10 Essential (primary) hypertension: Secondary | ICD-10-CM

## 2018-09-20 DIAGNOSIS — Z8042 Family history of malignant neoplasm of prostate: Secondary | ICD-10-CM

## 2018-09-20 DIAGNOSIS — J449 Chronic obstructive pulmonary disease, unspecified: Secondary | ICD-10-CM

## 2018-09-20 DIAGNOSIS — I2510 Atherosclerotic heart disease of native coronary artery without angina pectoris: Secondary | ICD-10-CM

## 2018-09-20 LAB — CBC WITH DIFFERENTIAL/PLATELET
Abs Immature Granulocytes: 0.03 10*3/uL (ref 0.00–0.07)
BASOS ABS: 0 10*3/uL (ref 0.0–0.1)
Basophils Relative: 0 %
EOS ABS: 0.1 10*3/uL (ref 0.0–0.5)
Eosinophils Relative: 1 %
HEMATOCRIT: 39.2 % (ref 39.0–52.0)
HEMOGLOBIN: 13.7 g/dL (ref 13.0–17.0)
IMMATURE GRANULOCYTES: 0 %
LYMPHS ABS: 0.7 10*3/uL (ref 0.7–4.0)
LYMPHS PCT: 7 %
MCH: 35.9 pg — ABNORMAL HIGH (ref 26.0–34.0)
MCHC: 34.9 g/dL (ref 30.0–36.0)
MCV: 102.6 fL — ABNORMAL HIGH (ref 80.0–100.0)
Monocytes Absolute: 0.7 10*3/uL (ref 0.1–1.0)
Monocytes Relative: 7 %
NRBC: 0 % (ref 0.0–0.2)
Neutro Abs: 8.8 10*3/uL — ABNORMAL HIGH (ref 1.7–7.7)
Neutrophils Relative %: 85 %
Platelets: 217 10*3/uL (ref 150–400)
RBC: 3.82 MIL/uL — ABNORMAL LOW (ref 4.22–5.81)
RDW: 11.4 % — AB (ref 11.5–15.5)
WBC: 10.5 10*3/uL (ref 4.0–10.5)

## 2018-09-20 LAB — COMPREHENSIVE METABOLIC PANEL
ALT: 20 U/L (ref 0–44)
ANION GAP: 8 (ref 5–15)
AST: 20 U/L (ref 15–41)
Albumin: 4 g/dL (ref 3.5–5.0)
Alkaline Phosphatase: 94 U/L (ref 38–126)
BUN: 7 mg/dL — ABNORMAL LOW (ref 8–23)
CO2: 26 mmol/L (ref 22–32)
Calcium: 9.2 mg/dL (ref 8.9–10.3)
Chloride: 100 mmol/L (ref 98–111)
Creatinine, Ser: 0.71 mg/dL (ref 0.61–1.24)
GFR calc non Af Amer: >60 mL/min (ref >60)
Glucose, Bld: 93 mg/dL (ref 70–99)
Potassium: 3.9 mmol/L (ref 3.5–5.1)
SODIUM: 134 mmol/L — AB (ref 135–145)
Total Bilirubin: 0.5 mg/dL (ref 0.3–1.2)
Total Protein: 6.9 g/dL (ref 6.5–8.1)

## 2018-09-20 MED ORDER — SODIUM CHLORIDE 0.9 % IV SOLN
1000.0000 mg | Freq: Once | INTRAVENOUS | Status: AC
  Administered 2018-09-20: 1000 mg via INTRAVENOUS
  Filled 2018-09-20: qty 20

## 2018-09-20 MED ORDER — DIPHENHYDRAMINE HCL 50 MG/ML IJ SOLN
25.0000 mg | Freq: Once | INTRAMUSCULAR | Status: AC
  Administered 2018-09-20: 25 mg via INTRAVENOUS
  Filled 2018-09-20: qty 1

## 2018-09-20 MED ORDER — SODIUM CHLORIDE 0.9 % IV SOLN
Freq: Once | INTRAVENOUS | Status: AC
  Administered 2018-09-20: 11:00:00 via INTRAVENOUS
  Filled 2018-09-20: qty 250

## 2018-09-20 MED ORDER — DEXAMETHASONE SODIUM PHOSPHATE 10 MG/ML IJ SOLN
10.0000 mg | Freq: Once | INTRAMUSCULAR | Status: AC
  Administered 2018-09-20: 10 mg via INTRAVENOUS
  Filled 2018-09-20: qty 1

## 2018-09-20 MED ORDER — HEPARIN SOD (PORK) LOCK FLUSH 100 UNIT/ML IV SOLN
500.0000 [IU] | Freq: Once | INTRAVENOUS | Status: AC | PRN
  Administered 2018-09-20: 500 [IU]

## 2018-09-20 MED ORDER — SODIUM CHLORIDE 0.9% FLUSH
10.0000 mL | INTRAVENOUS | Status: DC | PRN
  Administered 2018-09-20: 10 mL via INTRAVENOUS
  Filled 2018-09-20: qty 10

## 2018-09-20 MED ORDER — HEPARIN SOD (PORK) LOCK FLUSH 100 UNIT/ML IV SOLN
500.0000 [IU] | Freq: Once | INTRAVENOUS | Status: DC
  Filled 2018-09-20: qty 5

## 2018-09-21 LAB — THYROID PANEL WITH TSH
FREE THYROXINE INDEX: 2.2 (ref 1.2–4.9)
T3 UPTAKE RATIO: 23 % — AB (ref 24–39)
T4, Total: 9.7 ug/dL (ref 4.5–12.0)
TSH: 1.3 u[IU]/mL (ref 0.450–4.500)

## 2018-09-25 ENCOUNTER — Ambulatory Visit
Admission: RE | Admit: 2018-09-25 | Discharge: 2018-09-25 | Disposition: A | Discharge status: Home / Self Care | Payer: BLUE CROSS/BLUE SHIELD | Source: Ambulatory Visit | Attending: Oncology | Admitting: Oncology

## 2018-09-25 ENCOUNTER — Telehealth: Payer: Self-pay | Admitting: *Deleted

## 2018-09-25 DIAGNOSIS — R911 Solitary pulmonary nodule: Secondary | ICD-10-CM | POA: Diagnosis not present

## 2018-09-25 DIAGNOSIS — I700 Atherosclerosis of aorta: Secondary | ICD-10-CM | POA: Insufficient documentation

## 2018-09-25 DIAGNOSIS — C3490 Malignant neoplasm of unspecified part of unspecified bronchus or lung: Secondary | ICD-10-CM

## 2018-09-25 DIAGNOSIS — C3491 Malignant neoplasm of unspecified part of right bronchus or lung: Secondary | ICD-10-CM | POA: Diagnosis present

## 2018-09-25 DIAGNOSIS — I898 Other specified noninfective disorders of lymphatic vessels and lymph nodes: Secondary | ICD-10-CM | POA: Diagnosis not present

## 2018-09-25 DIAGNOSIS — J432 Centrilobular emphysema: Secondary | ICD-10-CM | POA: Insufficient documentation

## 2018-09-25 LAB — GLUCOSE, CAPILLARY: Glucose-Capillary: 91 mg/dL (ref 70–99)

## 2018-09-25 MED ORDER — FLUDEOXYGLUCOSE F - 18 (FDG) INJECTION
11.0800 | Freq: Once | INTRAVENOUS | Status: AC | PRN
  Administered 2018-09-25: 11.08 via INTRAVENOUS

## 2018-09-25 NOTE — Telephone Encounter (Signed)
Pt given results from PET scan this morning. Per Dr. Grayland Ormond, need to order ct guided biopsy of right upper lobe nodule. Reviewed MD recommendations with patient. Informed that will be notified once biopsy has been scheduled. Pt and wife verbalized understanding.

## 2018-09-27 ENCOUNTER — Other Ambulatory Visit: Payer: Self-pay | Admitting: *Deleted

## 2018-09-27 NOTE — Addendum Note (Signed)
Addended by: Telford Nab on: 09/27/2018 08:35 AM   Modules accepted: Orders

## 2018-10-04 ENCOUNTER — Encounter: Payer: Self-pay | Admitting: Radiation Oncology

## 2018-10-04 ENCOUNTER — Other Ambulatory Visit: Payer: Self-pay

## 2018-10-04 ENCOUNTER — Inpatient Hospital Stay: Payer: BLUE CROSS/BLUE SHIELD

## 2018-10-04 ENCOUNTER — Ambulatory Visit
Admission: RE | Admit: 2018-10-04 | Discharge: 2018-10-04 | Disposition: A | Discharge status: Home / Self Care | Payer: BLUE CROSS/BLUE SHIELD | Source: Ambulatory Visit | Attending: Radiation Oncology | Admitting: Radiation Oncology

## 2018-10-04 ENCOUNTER — Inpatient Hospital Stay: Payer: BLUE CROSS/BLUE SHIELD | Attending: Oncology

## 2018-10-04 VITALS — BP 145/87 | HR 78 | Temp 95.2°F | Resp 16 | Wt 207.6 lb

## 2018-10-04 DIAGNOSIS — Z9981 Dependence on supplemental oxygen: Secondary | ICD-10-CM | POA: Diagnosis not present

## 2018-10-04 DIAGNOSIS — F419 Anxiety disorder, unspecified: Secondary | ICD-10-CM | POA: Diagnosis not present

## 2018-10-04 DIAGNOSIS — R531 Weakness: Secondary | ICD-10-CM | POA: Insufficient documentation

## 2018-10-04 DIAGNOSIS — R5381 Other malaise: Secondary | ICD-10-CM | POA: Diagnosis not present

## 2018-10-04 DIAGNOSIS — Z86010 Personal history of colonic polyps: Secondary | ICD-10-CM | POA: Diagnosis not present

## 2018-10-04 DIAGNOSIS — F17210 Nicotine dependence, cigarettes, uncomplicated: Secondary | ICD-10-CM | POA: Insufficient documentation

## 2018-10-04 DIAGNOSIS — I7300 Raynaud's syndrome without gangrene: Secondary | ICD-10-CM | POA: Diagnosis not present

## 2018-10-04 DIAGNOSIS — R5382 Chronic fatigue, unspecified: Secondary | ICD-10-CM | POA: Insufficient documentation

## 2018-10-04 DIAGNOSIS — Z5112 Encounter for antineoplastic immunotherapy: Secondary | ICD-10-CM | POA: Insufficient documentation

## 2018-10-04 DIAGNOSIS — Z9221 Personal history of antineoplastic chemotherapy: Secondary | ICD-10-CM | POA: Diagnosis not present

## 2018-10-04 DIAGNOSIS — R911 Solitary pulmonary nodule: Secondary | ICD-10-CM | POA: Diagnosis not present

## 2018-10-04 DIAGNOSIS — C342 Malignant neoplasm of middle lobe, bronchus or lung: Secondary | ICD-10-CM | POA: Diagnosis present

## 2018-10-04 DIAGNOSIS — Z8042 Family history of malignant neoplasm of prostate: Secondary | ICD-10-CM | POA: Diagnosis not present

## 2018-10-04 DIAGNOSIS — Z923 Personal history of irradiation: Secondary | ICD-10-CM | POA: Diagnosis not present

## 2018-10-04 DIAGNOSIS — J449 Chronic obstructive pulmonary disease, unspecified: Secondary | ICD-10-CM | POA: Diagnosis not present

## 2018-10-04 DIAGNOSIS — K219 Gastro-esophageal reflux disease without esophagitis: Secondary | ICD-10-CM | POA: Insufficient documentation

## 2018-10-04 DIAGNOSIS — E871 Hypo-osmolality and hyponatremia: Secondary | ICD-10-CM | POA: Insufficient documentation

## 2018-10-04 DIAGNOSIS — Z79899 Other long term (current) drug therapy: Secondary | ICD-10-CM | POA: Diagnosis not present

## 2018-10-04 DIAGNOSIS — C3491 Malignant neoplasm of unspecified part of right bronchus or lung: Secondary | ICD-10-CM

## 2018-10-04 LAB — CBC WITH DIFFERENTIAL/PLATELET
Abs Immature Granulocytes: 0.03 10*3/uL (ref 0.00–0.07)
BASOS ABS: 0.1 10*3/uL (ref 0.0–0.1)
BASOS PCT: 1 %
Eosinophils Absolute: 0.2 10*3/uL (ref 0.0–0.5)
Eosinophils Relative: 2 %
HCT: 41 % (ref 39.0–52.0)
HEMOGLOBIN: 13.9 g/dL (ref 13.0–17.0)
Immature Granulocytes: 0 %
LYMPHS PCT: 6 %
Lymphs Abs: 0.6 10*3/uL — ABNORMAL LOW (ref 0.7–4.0)
MCH: 34.3 pg — ABNORMAL HIGH (ref 26.0–34.0)
MCHC: 33.9 g/dL (ref 30.0–36.0)
MCV: 101.2 fL — ABNORMAL HIGH (ref 80.0–100.0)
Monocytes Absolute: 0.6 10*3/uL (ref 0.1–1.0)
Monocytes Relative: 6 %
NEUTROS ABS: 8.8 10*3/uL — AB (ref 1.7–7.7)
NRBC: 0 % (ref 0.0–0.2)
Neutrophils Relative %: 85 %
PLATELETS: 184 10*3/uL (ref 150–400)
RBC: 4.05 MIL/uL — AB (ref 4.22–5.81)
RDW: 11.7 % (ref 11.5–15.5)
WBC: 10.3 10*3/uL (ref 4.0–10.5)

## 2018-10-04 LAB — COMPREHENSIVE METABOLIC PANEL
ALBUMIN: 3.8 g/dL (ref 3.5–5.0)
ALT: 23 U/L (ref 0–44)
AST: 23 U/L (ref 15–41)
Alkaline Phosphatase: 87 U/L (ref 38–126)
Anion gap: 9 (ref 5–15)
BUN: 8 mg/dL (ref 8–23)
CHLORIDE: 100 mmol/L (ref 98–111)
CO2: 27 mmol/L (ref 22–32)
CREATININE: 0.82 mg/dL (ref 0.61–1.24)
Calcium: 9.3 mg/dL (ref 8.9–10.3)
Glucose, Bld: 99 mg/dL (ref 70–99)
POTASSIUM: 4.1 mmol/L (ref 3.5–5.1)
Sodium: 136 mmol/L (ref 135–145)
Total Bilirubin: 0.6 mg/dL (ref 0.3–1.2)
Total Protein: 7 g/dL (ref 6.5–8.1)

## 2018-10-04 MED ORDER — SODIUM CHLORIDE 0.9% FLUSH
10.0000 mL | INTRAVENOUS | Status: DC | PRN
  Administered 2018-10-04: 10 mL via INTRAVENOUS
  Filled 2018-10-04: qty 10

## 2018-10-04 MED ORDER — SODIUM CHLORIDE 0.9 % IV SOLN
1000.0000 mg | Freq: Once | INTRAVENOUS | Status: AC
  Administered 2018-10-04: 1000 mg via INTRAVENOUS
  Filled 2018-10-04: qty 20

## 2018-10-04 MED ORDER — DEXAMETHASONE SODIUM PHOSPHATE 10 MG/ML IJ SOLN
10.0000 mg | Freq: Once | INTRAMUSCULAR | Status: AC
  Administered 2018-10-04: 10 mg via INTRAVENOUS
  Filled 2018-10-04: qty 1

## 2018-10-04 MED ORDER — DIPHENHYDRAMINE HCL 50 MG/ML IJ SOLN
25.0000 mg | Freq: Once | INTRAMUSCULAR | Status: AC
  Administered 2018-10-04: 25 mg via INTRAVENOUS
  Filled 2018-10-04: qty 1

## 2018-10-04 MED ORDER — HEPARIN SOD (PORK) LOCK FLUSH 100 UNIT/ML IV SOLN
500.0000 [IU] | Freq: Once | INTRAVENOUS | Status: AC
  Administered 2018-10-04: 500 [IU] via INTRAVENOUS
  Filled 2018-10-04: qty 5

## 2018-10-04 MED ORDER — SODIUM CHLORIDE 0.9 % IV SOLN
Freq: Once | INTRAVENOUS | Status: AC
  Administered 2018-10-04: 10:00:00 via INTRAVENOUS
  Filled 2018-10-04: qty 250

## 2018-10-04 NOTE — Progress Notes (Signed)
Radiation Oncology Follow up Note  Name: Paul Carpenter   Date:   10/04/2018 MRN:  [id number] DOB: [birth year]    This 62 y.o. male presents to the clinic today for 4 month follow-up status post concurrent chemoradiation for stage IIIB (T1 N3 M0) non-small cell lung cancer.  REFERRING PROVIDER: Kirk Ruths, MD  HPI: patient is a 62 year old male now out 4 months having completed concurrent chemoradiation for stage IIIBnon-small cell lung cancer of the right lung. He is currently on.maintenance Imfinzi and tolerating that well.he has a slight nonproductive cough no hemoptysis or chest tightness he recently had a PET CT scan showed excellent response in his chest although there is a new intense hypermetabolic lesion in the right upper lobe measuring 2.3 cm consistent with metastatic disease or a second primary bronchogenic carcinoma.  COMPLICATIONS OF TREATMENT: none  FOLLOW UP COMPLIANCE: keeps appointments   PHYSICAL EXAM:  BP (!) 145/87 (BP Location: Left Arm, Patient Position: Sitting)   Pulse 78   Temp (!) 95.2 F (35.1 C) (Tympanic)   Resp 16   Wt 207 lb 9 oz (94.2 kg)   BMI 28.15 kg/m  Well-developed well-nourished patient in NAD. HEENT reveals PERLA, EOMI, discs not visualized.  Oral cavity is clear. No oral mucosal lesions are identified. Neck is clear without evidence of cervical or supraclavicular adenopathy. Lungs are clear to A&P. Cardiac examination is essentially unremarkable with regular rate and rhythm without murmur rub or thrill. Abdomen is benign with no organomegaly or masses noted. Motor sensory and DTR levels are equal and symmetric in the upper and lower extremities. Cranial nerves II through XII are grossly intact. Proprioception is intact. No peripheral adenopathy or edema is identified. No motor or sensory levels are noted. Crude visual fields are within normal range.  RADIOLOGY RESULTS: PET CT scan reviewed and compatible with the above-stated  findings  PLAN: at this time case was presented at our weekly tumor conference. He has been referred for CT-guided biopsy although recent radiology is deferred for possible bronchoscopy. Do not feel this would be amenable to bronchoscopy although this will be discussed at our weekly tumor conference. Based on his historystage IIIB lung cancer would offer SB RT to this lesion treating to 6000 cGy in 5 fractions. Low side effect profile was explained to the patient as well as treatment planning. I've tentatively set him up for next week for CT simulation. Will again review his case at our weekly tumor conference. Patient is well aware of her treatment plan andhis presentation a conference. He knows to call with any concerns.  I would like to take this opportunity to thank you for allowing me to participate in the care of your patient.[name], MD

## 2018-10-05 ENCOUNTER — Other Ambulatory Visit: Payer: BLUE CROSS/BLUE SHIELD

## 2018-10-05 ENCOUNTER — Other Ambulatory Visit: Payer: Self-pay | Admitting: *Deleted

## 2018-10-05 LAB — THYROID PANEL WITH TSH
FREE THYROXINE INDEX: 2.3 (ref 1.2–4.9)
T3 Uptake Ratio: 24 % (ref 24–39)
T4, Total: 9.4 ug/dL (ref 4.5–12.0)
TSH: 0.957 u[IU]/mL (ref 0.450–4.500)

## 2018-10-05 NOTE — Progress Notes (Signed)
Tumor Board Documentation  Paul Carpenter was presented by Dr Grayland Ormond at our Tumor Board on 10/05/2018, which included representatives from medical oncology, radiation oncology, surgical, radiology, pathology, navigation, internal medicine, research, pulmonology.  Paul Carpenter currently presents as a current patient with history of the following treatments: immunotherapy, adjuvant chemotherapy.  Additionally, we reviewed previous medical and familial history, history of present illness, and recent lab results along with all available histopathologic and imaging studies. The tumor board considered available treatment options and made the following recommendations: Active surveillance Repeat Ct in 2 months and if area of concern is still present or enlarged, refer to Pulmonology   The following procedures/referrals were also placed: No orders of the defined types were placed in this encounter.   Clinical Trial Status: not discussed   Staging used: AJCC Stage Group  National site-specific guidelines   were discussed with respect to the case.  Tumor board is a meeting of clinicians from various specialty areas who evaluate and discuss patients for whom a multidisciplinary approach is being considered. Final determinations in the plan of care are those of the provider(s). The responsibility for follow up of recommendations given during tumor board is that of the provider.   Today's extended care, comprehensive team conference, Paul Carpenter was not present for the discussion and was not examined.   Multidisciplinary Tumor Board is a multidisciplinary case peer review process.  Decisions discussed in the Multidisciplinary Tumor Board reflect the opinions of the specialists present at the conference without having examined the patient.  Ultimately, treatment and diagnostic decisions rest with the primary provider(s) and the patient.

## 2018-10-06 ENCOUNTER — Telehealth: Payer: Self-pay | Admitting: *Deleted

## 2018-10-06 DIAGNOSIS — R911 Solitary pulmonary nodule: Secondary | ICD-10-CM

## 2018-10-06 NOTE — Telephone Encounter (Signed)
Spoke with patient and his wife regarding recommendations from tumor board. Informed that will be scheduled for CT chest in 2 months and will follow up with Dr. Baruch Gouty after scan. Instructed to keep all appts as scheduled with Dr. Grayland Ormond for Imfinzi infusions. Pt and wife verbalized understanding.

## 2018-10-09 ENCOUNTER — Ambulatory Visit: Payer: BLUE CROSS/BLUE SHIELD

## 2018-10-09 ENCOUNTER — Telehealth: Payer: Self-pay | Admitting: *Deleted

## 2018-10-09 MED ORDER — AMOXICILLIN-POT CLAVULANATE 875-125 MG PO TABS: 1.0000 | ORAL_TABLET | Freq: Two times a day (BID) | ORAL | 0 refills | Status: DC

## 2018-10-09 NOTE — Telephone Encounter (Signed)
Pt called in to report is having sinus congestion with productive cough and chills. Requests if can get antibiotic called in. Per Dr. Woodfin Ganja, send in rx for augmentin 875/125 mg BID x 7 days. Pt made aware.

## 2018-10-12 ENCOUNTER — Encounter: Payer: BLUE CROSS/BLUE SHIELD | Attending: Physician Assistant | Admitting: Physician Assistant

## 2018-10-12 DIAGNOSIS — F17200 Nicotine dependence, unspecified, uncomplicated: Secondary | ICD-10-CM | POA: Diagnosis not present

## 2018-10-12 DIAGNOSIS — J449 Chronic obstructive pulmonary disease, unspecified: Secondary | ICD-10-CM | POA: Insufficient documentation

## 2018-10-12 DIAGNOSIS — C3490 Malignant neoplasm of unspecified part of unspecified bronchus or lung: Secondary | ICD-10-CM | POA: Diagnosis present

## 2018-10-12 DIAGNOSIS — I10 Essential (primary) hypertension: Secondary | ICD-10-CM | POA: Diagnosis not present

## 2018-10-12 DIAGNOSIS — S41102A Unspecified open wound of left upper arm, initial encounter: Secondary | ICD-10-CM | POA: Diagnosis not present

## 2018-10-15 NOTE — Progress Notes (Signed)
BURYL, BAMBER ([id number]) Visit Report for 10/12/2018 Abuse/Suicide Risk Screen Details Patient Name: Paul Carpenter, Paul Carpenter Date of Service: 10/12/2018 11:45 AM Medical Record Number: [id number] Patient Account Number: [id number] Date of Birth/Sex: 01-22-1956 (62 y.o. M) Treating RN: Montey Hora Primary Care Laycie Schriner: Frazier Richards Other Clinician: Referring Prosperity Darrough: Referral, Self Treating Lilla Callejo/Extender: Melburn Hake, HOYT Weeks in Treatment: 0 Abuse/Suicide Risk Screen Items Answer ABUSE/SUICIDE RISK SCREEN: Has anyone close to you tried to hurt or harm you recentlyo No Do you feel uncomfortable with anyone in your familyo No Has anyone forced you do things that you didnot want to doo No Do you have any thoughts of harming yourselfo No Patient displays signs or symptoms of abuse and/or neglect. No Electronic Signature(s) Signed: 10/13/2018 5:23:14 PM By: Montey Hora Entered By: Montey Hora on 10/12/2018 12:01:04 Paul Carpenter ([id number]) -------------------------------------------------------------------------------- Activities of Daily Living Details Patient Name: Paul Carpenter Date of Service: 10/12/2018 11:45 AM Medical Record Number: [id number] Patient Account Number: [id number] Date of Birth/Sex: 09-Dec-1955 (62 y.o. M) Treating RN: Montey Hora Primary Care Tammara Massing: Frazier Richards Other Clinician: Referring Reda Citron: Referral, Self Treating Kirtis Challis/Extender: Melburn Hake, HOYT Weeks in Treatment: 0 Activities of Daily Living Items Answer Activities of Daily Living (Please select one for each item) Drive Automobile Completely Able Take Medications Completely Able Use Telephone Completely Able Care for Appearance Completely Able Use Toilet Completely Able Bath / Shower Completely Able Dress Self Completely Able Feed Self Completely Able Walk Completely Able Get In / Out Bed Completely Able Housework Completely Able Prepare Meals  Completely [place] for Self Completely Able Electronic Signature(s) Signed: 10/13/2018 5:23:14 PM By: Montey Hora Entered By: Montey Hora on 10/12/2018 12:01:24 Paul Carpenter ([id number]) -------------------------------------------------------------------------------- Education Assessment Details Patient Name: Paul Carpenter Date of Service: 10/12/2018 11:45 AM Medical Record Number: [id number] Patient Account Number: [id number] Date of Birth/Sex: February 26, 1956 (62 y.o. M) Treating RN: Montey Hora Primary Care Cristin Penaflor: Frazier Richards Other Clinician: Referring Kumar Falwell: Referral, Self Treating Tyreanna Bisesi/Extender: Melburn Hake, HOYT Weeks in Treatment: 0 Primary Learner Assessed: Caregiver spouse Reason Patient is not Primary Learner: wound location Learning Preferences/Education Level/Primary Language Learning Preference: Explanation, Demonstration Highest Education Level: College or Above Preferred Language: English Cognitive Barrier Assessment/Beliefs Language Barrier: No Translator Needed: No Memory Deficit: No Emotional Barrier: No Cultural/Religious Beliefs Affecting Medical Care: No Physical Barrier Assessment Impaired Vision: No Impaired Hearing: No Decreased Hand dexterity: No Knowledge/Comprehension Assessment Knowledge Level: Medium Comprehension Level: Medium Ability to understand written Medium instructions: Ability to understand verbal Medium instructions: Motivation Assessment Anxiety Level: Calm Cooperation: Cooperative Education Importance: Acknowledges Need Interest in Health Problems: Asks Questions Perception: Coherent Willingness to Engage in Self- Medium Management Activities: Readiness to Engage in Self- Medium Management Activities: Electronic Signature(s) Signed: 10/13/2018 5:23:14 PM By: Montey Hora Entered By: Montey Hora on 10/12/2018 12:01:58 Paul Carpenter  ([id number]) -------------------------------------------------------------------------------- Fall Risk Assessment Details Patient Name: Paul Carpenter Date of Service: 10/12/2018 11:45 AM Medical Record Number: [id number] Patient Account Number: [id number] Date of Birth/Sex: 1956-09-27 (62 y.o. M) Treating RN: Montey Hora Primary Care Neera Teng: Frazier Richards Other Clinician: Referring Zoeann Mol: Referral, Self Treating Wynter Grave/Extender: Melburn Hake, HOYT Weeks in Treatment: 0 Fall Risk Assessment Items Have you had 2 or more falls in the last 12 monthso 0 No Have you had any fall that resulted in injury in the last 12 monthso 0 Yes FALL RISK ASSESSMENT: History of falling - immediate or within 3 months 25 Yes Secondary diagnosis 0 No Ambulatory  aid None/bed rest/wheelchair/nurse 0 Yes Crutches/cane/walker 0 No Furniture 0 No IV Access/Saline Lock 0 No Gait/Training Normal/bed rest/immobile 0 Yes Weak 0 No Impaired 0 No Mental Status Oriented to own ability 0 Yes Electronic Signature(s) Signed: 10/13/2018 5:23:14 PM By: Montey Hora Entered By: Montey Hora on 10/12/2018 12:02:09 Paul Carpenter ([id number]) -------------------------------------------------------------------------------- Foot Assessment Details Patient Name: Paul Carpenter Date of Service: 10/12/2018 11:45 AM Medical Record Number: [id number] Patient Account Number: [id number] Date of Birth/Sex: 09-04-1956 (62 y.o. M) Treating RN: Montey Hora Primary Care Enjoli Tidd: Frazier Richards Other Clinician: Referring Melaine Mcphee: Referral, Self Treating Tayden Duran/Extender: Melburn Hake, HOYT Weeks in Treatment: 0 Foot Assessment Items Site Locations + = Sensation present, - = Sensation absent, C = Callus, U = Ulcer R = Redness, W = Warmth, M = Maceration, PU = Pre-ulcerative lesion F = Fissure, S = Swelling, D = Dryness Assessment Right: Left: Other Deformity: No No Prior Foot Ulcer: No  No Prior Amputation: No No Charcot Joint: No No Ambulatory Status: Ambulatory Without Help Gait: Steady Electronic Signature(s) Signed: 10/13/2018 5:23:14 PM By: Montey Hora Entered By: Montey Hora on 10/12/2018 12:02:44 Paul Carpenter ([id number]) -------------------------------------------------------------------------------- Nutrition Risk Assessment Details Patient Name: Paul Carpenter Date of Service: 10/12/2018 11:45 AM Medical Record Number: [id number] Patient Account Number: [id number] Date of Birth/Sex: 1956/05/06 (62 y.o. M) Treating RN: Montey Hora Primary Care Jensen Kilburg: Frazier Richards Other Clinician: Referring Joane Postel: Referral, Self Treating Laynie Espy/Extender: Melburn Hake, HOYT Weeks in Treatment: 0 Height (in): 72 Weight (lbs): 206 Body Mass Index (BMI): 27.9 Nutrition Risk Assessment Items NUTRITION RISK SCREEN: I have an illness or condition that made me change the kind and/or amount of 0 No food I eat I eat fewer than two meals per day 0 No I eat few fruits and vegetables, or milk products 0 No I have three or more drinks of beer, liquor or wine almost every day 0 No I have tooth or mouth problems that make it hard for me to eat 0 No I don't always have enough money to buy the food I need 0 No I eat alone most of the time 0 No I take three or more different prescribed or over-the-counter drugs a day 1 Yes Without wanting to, I have lost or gained 10 pounds in the last six months 0 No I am not always physically able to shop, cook and/or feed myself 0 No Nutrition Protocols Good Risk Protocol 0 No interventions needed Moderate Risk Protocol Electronic Signature(s) Signed: 10/13/2018 5:23:14 PM By: Montey Hora Entered By: Montey Hora on 10/12/2018 12:02:35

## 2018-10-15 NOTE — Progress Notes (Signed)
[place]  [phone] [phone]  ID: Paul Carpenter OB: [birth year]  MR#: [id number]  INO#:[id number]  Patient Care Team: Kirk Ruths, MD as PCP - General (Internal Medicine)  CHIEF COMPLAINT: Stage IIIa non-small cell lung cancer, right middle lobe.  INTERVAL HISTORY: Patient returns to clinic today for further evaluation and consideration of cycle 8 of maintenance durvalumab.  He continues to have chronic weakness and fatigue, but otherwise feels well.  He continues to be highly anxious.  He has no neurologic complaints. He denies any recent fevers or illnesses.  He does not complain of dysphasia today.  He has no chest pain, cough, hemoptysis, or shortness of breath. He denies any nausea, vomiting, constipation, or diarrhea. He has no urinary complaints.  Patient offers no further specific complaints today.  REVIEW OF SYSTEMS:   Review of Systems  Constitutional: Positive for malaise/fatigue. Negative for fever and weight loss.  HENT: Negative.  Negative for sore throat.   Respiratory: Negative.  Negative for cough, hemoptysis and shortness of breath.   Cardiovascular: Negative.  Negative for chest pain and leg swelling.  Gastrointestinal: Negative.  Negative for abdominal pain, blood in stool and melena.  Genitourinary: Negative.  Negative for dysuria.  Musculoskeletal: Negative.  Negative for joint pain.  Skin: Negative.  Negative for rash.  Neurological: Positive for weakness. Negative for sensory change, focal weakness and headaches.  Psychiatric/Behavioral: The patient is nervous/anxious.     As per HPI. Otherwise, a complete review of systems is negative.  PAST MEDICAL HISTORY: Past Medical History:  Diagnosis Date  . Anginal pain ([place])   . Anxiety   . Chest pain   . Chicken pox   . Complication of anesthesia    o2 dropped after neck fusion  . COPD (chronic obstructive pulmonary disease) ([place])   . Coronary artery  disease   . Cough    chronic  clear phlegm  . Dysrhythmia    palpitations  . GERD (gastroesophageal reflux disease)    h/o reflux/ hoarsness  . Hematochezia   . Hemorrhoids   . History of chickenpox   . History of colon polyps   . History of Helicobacter pylori infection   . Hoarseness   . Hypertension   . Lung cancer ([place]) 05/2016   Chemo + rad tx's.   . Migraines   . OSA (obstructive sleep apnea)    has CPAP but does not use  . Personal history of tobacco use, presenting hazards to health 03/05/2016  . Pneumonia    5/17  . Raynaud disease   . Raynaud disease   . Raynaud's disease   . Rotator cuff tear    on right  . Shortness of breath dyspnea   . Sleep apnea   . Ulcer (traumatic) of oral mucosa     PAST SURGICAL HISTORY: Past Surgical History:  Procedure Laterality Date  . BACK SURGERY     cervical fusion x 2  . CARDIAC CATHETERIZATION    . CERVICAL DISCECTOMY     x 2  . COLONOSCOPY    . COLONOSCOPY N/A 07/25/2015   Procedure: COLONOSCOPY;  Surgeon: Lollie Sails, MD;  Location[facility] ENDOSCOPY;  Service: Endoscopy;  Laterality: N/A;  . COLONOSCOPY WITH PROPOFOL N/A 10/04/2017   Procedure: COLONOSCOPY WITH PROPOFOL;  Surgeon: Lollie Sails, MD;  Location: [facility] ENDOSCOPY;  Service: Endoscopy;  Laterality: N/A;  . ELECTROMAGNETIC NAVIGATION BROCHOSCOPY Left 06/28/2016   Procedure: ELECTROMAGNETIC NAVIGATION BRONCHOSCOPY;  Surgeon: Vilinda Boehringer,  MD;  Location: [facility] ORS;  Service: Cardiopulmonary;  Laterality: Left;  . ENDOBRONCHIAL ULTRASOUND N/A 04/11/2018   Procedure: ENDOBRONCHIAL ULTRASOUND;  Surgeon: [name], MD;  Location: [facility] ORS;  Service: Cardiopulmonary;  Laterality: N/A;  . ESOPHAGOGASTRODUODENOSCOPY N/A 07/25/2015   Procedure: ESOPHAGOGASTRODUODENOSCOPY (EGD);  Surgeon: Lollie Sails, MD;  Location: [facility] ENDOSCOPY;  Service: Endoscopy;  Laterality: N/A;  . NASAL SINUS SURGERY     x 2   . PORTA CATH INSERTION N/A 04/24/2018   Procedure:  PORTA CATH INSERTION;  Surgeon: Algernon Huxley, MD;  Location: [place] CV LAB;  Service: Cardiovascular;  Laterality: N/A;  . rotator cuff surgery Right    07/2016  . SEPTOPLASTY    . SKIN GRAFT      FAMILY HISTORY Family History  Problem Relation Age of Onset  . Heart disease Father   . Prostate cancer Father   . Heart disease Paternal Grandmother   . Heart attack Maternal Grandfather 52  . Kidney cancer Neg Hx   . Bladder Cancer Neg Hx   . Other Neg Hx        pituitary abnormality       ADVANCED DIRECTIVES:    HEALTH MAINTENANCE: Social History   Tobacco Use  . Smoking status: Current Every Day Smoker    Packs/day: 1.50    Years: 45.00    Pack years: 67.50    Types: Cigarettes  . Smokeless tobacco: Never Used  Substance Use Topics  . Alcohol use: Yes    Alcohol/week: 2.0 standard drinks    Types: 2 Standard drinks or equivalent per week    Comment: moderate  . Drug use: No     Allergies  Allergen Reactions  . Lisinopril Rash  . Varenicline Rash    Current Outpatient Medications  Medication Sig Dispense Refill  . acetaminophen (TYLENOL) 500 MG tablet Take 1,000 mg by mouth every 6 (six) hours as needed (for pain.).    Marland Kitchen albuterol (PROAIR HFA) 108 (90 Base) MCG/ACT inhaler Inhale 2 puffs into the lungs every 4 (four) hours as needed for wheezing or shortness of breath. 1 Inhaler 1  . atorvastatin (LIPITOR) 40 MG tablet Take 40 mg by mouth at bedtime.     . busPIRone (BUSPAR) 15 MG tablet Take 15 mg by mouth 2 (two) times daily.   10  . diazepam (VALIUM) 5 MG tablet Take 5 mg by mouth every 12 (twelve) hours as needed for anxiety (SCHEDULED EVERY NIGHT).     . DULoxetine (CYMBALTA) 60 MG capsule Take 60 mg by mouth daily.     . fluticasone (FLONASE) 50 MCG/ACT nasal spray Place 1 spray into both nostrils daily as needed (for allergies.).     Marland Kitchen ipratropium-albuterol (DUONEB) 0.5-2.5 (3) MG/3ML SOLN Take 3 mLs by nebulization every 6 (six) hours. 360 mL 5    . lidocaine (XYLOCAINE) 2 % solution   2  . metoprolol tartrate (LOPRESSOR) 50 MG tablet Take 50 mg by mouth 2 (two) times daily.    . mirabegron ER (MYRBETRIQ) 50 MG TB24 tablet Take 1 tablet (50 mg total) by mouth daily. 90 tablet 3  . Multiple Vitamin (MULTIVITAMIN WITH MINERALS) TABS tablet Take 1 tablet by mouth daily.     . nicotine (NICODERM CQ - DOSED IN MG/24 HOURS) 21 mg/24hr patch Place 1 patch (21 mg total) onto the skin daily. 30 patch 12  . ondansetron (ZOFRAN) 8 MG tablet Take 8 mg by mouth every 8 (eight) hours as needed  for nausea or vomiting.    . OXYGEN Inhale 2 L into the lungs at bedtime.     . pantoprazole (PROTONIX) 40 MG tablet Take 40 mg by mouth daily.  3  . potassium chloride (K-DUR) 10 MEQ tablet TAKE 1 TABLET (10 MEQ TOTAL) BY MOUTH ONCE DAILY.  3  . umeclidinium-vilanterol (ANORO ELLIPTA) 62.5-25 MCG/INH AEPB Inhale 1 puff into the lungs daily. 1 each 10  . umeclidinium-vilanterol (ANORO ELLIPTA) 62.5-25 MCG/INH AEPB Inhale 1 puff into the lungs daily. 1 each 0  . nitroGLYCERIN (NITROSTAT) 0.4 MG SL tablet Place 0.4 mg under the tongue every 5 (five) minutes as needed for chest pain.      No current facility-administered medications for this visit.    Facility-Administered Medications Ordered in Other Visits  Medication Dose Route Frequency Provider Last Rate Last Dose  . heparin lock flush 100 unit/mL  500 Units Intravenous Once Paul Huger, MD        OBJECTIVE: Vitals:   10/18/18 0956  BP: 129/79  Pulse: 72  Temp: 97.8 F (36.6 C)     Body mass index is 28.26 kg/m.    ECOG FS:0 - Asymptomatic  General: Well-developed, well-nourished, no acute distress. Eyes: Pink conjunctiva, anicteric sclera. HEENT: Normocephalic, moist mucous membranes. Lungs: Scattered wheezing throughout. Heart: Regular rate and rhythm. No rubs, murmurs, or gallops. Abdomen: Soft, nontender, nondistended. No organomegaly noted, normoactive bowel  sounds. Musculoskeletal: No edema, cyanosis, or clubbing. Neuro: Alert, answering all questions appropriately. Cranial nerves grossly intact. Skin: No rashes or petechiae noted. Psych: Normal affect.  LAB RESULTS:  Lab Results  Component Value Date   NA 132 (L) 10/18/2018   K 4.0 10/18/2018   CL 98 10/18/2018   CO2 27 10/18/2018   GLUCOSE 123 (H) 10/18/2018   BUN 9 10/18/2018   CREATININE 0.76 10/18/2018   CALCIUM 9.2 10/18/2018   PROT 6.8 10/18/2018   ALBUMIN 3.9 10/18/2018   AST 22 10/18/2018   ALT 20 10/18/2018   ALKPHOS 84 10/18/2018   BILITOT 0.5 10/18/2018   GFRNONAA >60 10/18/2018   GFRAA >60 10/18/2018    Lab Results  Component Value Date   WBC 8.2 10/18/2018   NEUTROABS 6.6 10/18/2018   HGB 13.5 10/18/2018   HCT 38.9 (L) 10/18/2018   MCV 99.2 10/18/2018   PLT 208 10/18/2018     STUDIES:  ASSESSMENT: Stage IIIa non-small cell lung cancer, right middle lobe.  PLAN:    1. Stage IIIa non-small cell lung cancer, right middle lobe: Case discussed with pathologist and unable to determine whether this is adenocarcinoma or squamous carcinoma.  There is also insufficient tissue to do further testing.  Liquid biopsy did not reveal any actionable mutations.  PET scan was denied by insurance, therefore proceeded with CT scan on July 11, 2018.  Results reviewed independently revealing a progressive nodule in the medial left upper lobe is suspicious but not definitive of malignancy.  The remainder of his known disease is grossly unchanged. MRI of the brain completed on Mar 28, 2018 reviewed independently did not reveal metastatic disease.  Patient completed XRT June 26, 2018.  He completed his concurrent single agent carboplatinum on June 21, 2018.  Patient had a reaction to Taxol during cycle 1 and this was discontinued.  Patient initiated maintenance durvalumab on July 12, 2018.  Proceed with cycle 8 of maintenance durvalumab today.  Return to clinic in 2 weeks  for treatment only and then in 4 weeks for further evaluation  and consideration of cycle 10.  2.  Secondary polycythemia: Resolved.  Likely secondary to heavy tobacco use.  Previously, the remainder of his laboratory work, including JAK-2 mutation, was either negative or within normal limits.  3.  Right upper lobe pulmonary nodule: PET scan results from September 25, 2018 reviewed independently and report as above.  Case was also discussed at cancer conference and thought was this was more consistent with inflammatory or infectious process rather than recurrent malignancy.  Plan was to repeat CT scan in 2 months to assess for interval change.  This is been scheduled for December 14, 2018.   4.  Colon polyps:  Patient has a personal history of greater then 10 adenomatous polyps on his most recent conoloscopy. He does not know of any family history of increased polyps or colon cancer.  Genetic testing to assess for the APC mutation for FAP or AFAP was negative. Continue colonoscopies as per GI. 5. Tobacco Use: Patient continues to heavily smoke.  He expressed understanding by continuing tobacco use increases his chance of recurrence. 6. Anxiety: Chronic.  Continue IV Benadryl with each treatment.  Continue treatment and evaluation per primary care. 7.  Hyponatremia: Chronic and unchanged.  Monitor.  Patient expressed understanding and was in agreement with this plan. He also understands that He can call clinic at any time with any questions, concerns, or complaints.    Paul Huger, MD   10/20/2018 11:23 AM

## 2018-10-16 NOTE — Progress Notes (Signed)
Paul Carpenter, Paul Carpenter ([id number]) Visit Report for 10/12/2018 Allergy List Details Patient Name: Paul, Carpenter Date of Service: 10/12/2018 11:45 AM Medical Record Number: [id number] Patient Account Number: [id number] Date of Birth/Sex: [birth year] (62 y.o. M) Treating RN: Montey Hora Primary Care Baila Rouse: Frazier Richards Other Clinician: Referring Sakira Dahmer: Referral, Self Treating Jihan Mellette/Extender: STONE III, HOYT Weeks in Treatment: 0 Allergies Active Allergies No Known Drug Allergies Allergy Notes Electronic Signature(s) Signed: 10/13/2018 5:23:14 PM By: Montey Hora Entered By: Montey Hora on 10/12/2018 12:00:54 Paul Carpenter ([id number]) -------------------------------------------------------------------------------- Arrival Information Details Patient Name: Paul Carpenter Date of Service: 10/12/2018 11:45 AM Medical Record Number: [id number] Patient Account Number: [id number] Date of Birth/Sex: April 28, 1956 (62 y.o. M) Treating RN: Montey Hora Primary Care Crystalyn Delia: Frazier Richards Other Clinician: Referring Tymesha Ditmore: Referral, Self Treating Melva Faux/Extender: Melburn Hake, HOYT Weeks in Treatment: 0 Visit Information Patient Arrived: Ambulatory Arrival Time: 11:55 Accompanied By: spouse Transfer Assistance: None Patient Identification Verified: Yes Secondary Verification Process Completed: Yes Electronic Signature(s) Signed: 10/13/2018 5:23:14 PM By: Montey Hora Entered By: Montey Hora on 10/12/2018 11:56:24 Paul Carpenter ([id number]) -------------------------------------------------------------------------------- Clinic Level of Care Assessment Details Patient Name: Paul Carpenter Date of Service: 10/12/2018 11:45 AM Medical Record Number: [id number] Patient Account Number: [id number] Date of Birth/Sex: 03/11/1956 (62 y.o. M) Treating RN: Cornell Barman Primary Care Janyla Biscoe: Frazier Richards Other Clinician: Referring Daire Okimoto:  Referral, Self Treating Brooklee Michelin/Extender: Melburn Hake, HOYT Weeks in Treatment: 0 Clinic Level of Care Assessment Items TOOL 1 Quantity Score []  - Use when EandM and Procedure is performed on INITIAL visit 0 ASSESSMENTS - Nursing Assessment / Reassessment X - General Physical Exam (combine w/ comprehensive assessment (listed just below) when 1 20 performed on new pt. evals) X- 1 25 Comprehensive Assessment (HX, ROS, Risk Assessments, Wounds Hx, etc.) ASSESSMENTS - Wound and Skin Assessment / Reassessment []  - Dermatologic / Skin Assessment (not related to wound area) 0 ASSESSMENTS - Ostomy and/or Continence Assessment and Care []  - Incontinence Assessment and Management 0 []  - 0 Ostomy Care Assessment and Management (repouching, etc.) PROCESS - Coordination of Care X - Simple Patient / Family Education for ongoing care 1 15 []  - 0 Complex (extensive) Patient / Family Education for ongoing care X- 1 10 Staff obtains Programmer, systems, Records, Test Results / Process Orders []  - 0 Staff telephones HHA, Nursing Homes / Clarify orders / etc []  - 0 Routine Transfer to another Facility (non-emergent condition) []  - 0 Routine Hospital Admission (non-emergent condition) X- 1 15 New Admissions / Biomedical engineer / Ordering NPWT, Apligraf, etc. []  - 0 Emergency Hospital Admission (emergent condition) PROCESS - Special Needs []  - Pediatric / Minor Patient Management 0 []  - 0 Isolation Patient Management []  - 0 Hearing / Language / Visual special needs []  - 0 Assessment of Community assistance (transportation, D/C planning, etc.) []  - 0 Additional assistance / Altered mentation []  - 0 Support Surface(s) Assessment (bed, cushion, seat, etc.) Loose, [name] ([id number]) INTERVENTIONS - Miscellaneous []  - External ear exam 0 []  - 0 Patient Transfer (multiple staff / Civil Service fast streamer / Similar devices) []  - 0 Simple Staple / Suture removal (25 or less) []  - 0 Complex Staple / Suture  removal (26 or more) []  - 0 Hypo/Hyperglycemic Management (do not check if billed separately) []  - 0 Ankle / Brachial Index (ABI) - do not check if billed separately Has the patient been seen at the hospital within the last three years: Yes Total Score: 85 Level Of Care: New/Established - Level 3  Electronic Signature(s) Signed: 10/16/2018 11:07:52 AM By: Gretta Cool, BSN, RN, CWS, Kim RN, BSN Entered By: Gretta Cool, BSN, RN, CWS, Kim on 10/12/2018 12:39:49 Paul Carpenter, Paul Carpenter ([id number]) -------------------------------------------------------------------------------- Encounter Discharge Information Details Patient Name: Paul Carpenter Date of Service: 10/12/2018 11:45 AM Medical Record Number: [id number] Patient Account Number: [id number] Date of Birth/Sex: [birth year] (62 y.o. M) Treating RN: Cornell Barman Primary Care Katarina Riebe: Frazier Richards Other Clinician: Referring Enya Bureau: Referral, Self Treating Minola Guin/Extender: Melburn Hake, HOYT Weeks in Treatment: 0 Encounter Discharge Information Items Post Procedure Vitals Discharge Condition: Stable Temperature (F): 97.8 Ambulatory Status: Ambulatory Pulse (bpm): 75 Discharge Destination: Home Respiratory Rate (breaths/min): 18 Transportation: Private Auto Blood Pressure (mmHg): 130/80 Accompanied By: spouse Schedule Follow-up Appointment: Yes Clinical Summary of Care: Electronic Signature(s) Signed: 10/16/2018 11:07:52 AM By: Gretta Cool, BSN, RN, CWS, Kim RN, BSN Entered By: Gretta Cool, BSN, RN, CWS, Kim on 10/12/2018 12:42:01 Paul Carpenter ([id number]) -------------------------------------------------------------------------------- Lower Extremity Assessment Details Patient Name: Paul Carpenter Date of Service: 10/12/2018 11:45 AM Medical Record Number: [id number] Patient Account Number: [id number] Date of Birth/Sex: 05-25-1956 (62 y.o. M) Treating RN: Montey Hora Primary Care Charnita Trudel: Frazier Richards Other  Clinician: Referring Rosezella Kronick: Referral, Self Treating Jowan Skillin/Extender: Melburn Hake, HOYT Weeks in Treatment: 0 Electronic Signature(s) Signed: 10/13/2018 5:23:14 PM By: Montey Hora Entered By: Montey Hora on 10/12/2018 12:00:38 Paul Carpenter ([id number]) -------------------------------------------------------------------------------- Multi Wound Chart Details Patient Name: Paul Carpenter Date of Service: 10/12/2018 11:45 AM Medical Record Number: [id number] Patient Account Number: [id number] Date of Birth/Sex: Jun 05, 1956 (62 y.o. M) Treating RN: Cornell Barman Primary Care Aundra Espin: Frazier Richards Other Clinician: Referring Rashee Marschall: Referral, Self Treating Lachae Hohler/Extender: Melburn Hake, HOYT Weeks in Treatment: 0 Vital Signs Height(in): 72 Pulse(bpm): 75 Weight(lbs): 206 Blood Pressure(mmHg): 130/80 Body Mass Index(BMI): 28 Temperature(F): 97.8 Respiratory Rate 18 (breaths/min): Photos: [N/A:N/A] Wound Location: Left Upper Arm N/A N/A Wounding Event: Trauma N/A N/A Primary Etiology: Skin Tear N/A N/A Comorbid History: Anemia, Chronic Obstructive N/A N/A Pulmonary Disease (COPD), Hypertension, Neuropathy, Received Chemotherapy, Received Radiation Date Acquired: 10/06/2018 N/A N/A Weeks of Treatment: 0 N/A N/A Wound Status: Open N/A N/A Measurements L x W x D 8.2x2.7x0.1 N/A N/A (cm) Area (cm) : 17.389 N/A N/A Volume (cm) : 1.739 N/A N/A Classification: Full Thickness Without N/A N/A Exposed Support Structures Exudate Amount: Medium N/A N/A Exudate Type: Serosanguineous N/A N/A Exudate Color: red, brown N/A N/A Wound Margin: Flat and Intact N/A N/A Granulation Amount: Small (1-33%) N/A N/A Granulation Quality: Red N/A N/A Necrotic Amount: Medium (34-66%) N/A N/A Exposed Structures: Fat Layer (Subcutaneous N/A N/A Tissue) Exposed: Yes Fascia: No Tendon: No Muscle: No Memmer, Paul N. ([id number]) Joint: No Bone: No Epithelialization: None  N/A N/A Periwound Skin Texture: Excoriation: No N/A N/A Induration: No Callus: No Crepitus: No Rash: No Scarring: No Periwound Skin Moisture: Maceration: No N/A N/A Dry/Scaly: No Periwound Skin Color: Atrophie Blanche: No N/A N/A Cyanosis: No Ecchymosis: No Erythema: No Hemosiderin Staining: No Mottled: No Pallor: No Rubor: No Temperature: No Abnormality N/A N/A Tenderness on Palpation: Yes N/A N/A Wound Preparation: Ulcer Cleansing: N/A N/A Rinsed/Irrigated with Saline Topical Anesthetic Applied: Other: lidocaine 4% Treatment Notes Electronic Signature(s) Signed: 10/16/2018 11:07:52 AM By: Gretta Cool, BSN, RN, CWS, Kim RN, BSN Entered By: Gretta Cool, BSN, RN, CWS, Kim on 10/12/2018 12:37:09 Paul Carpenter, Paul Carpenter ([id number]) -------------------------------------------------------------------------------- [place] Details Patient Name: Paul Carpenter Date of Service: 10/12/2018 11:45 AM Medical Record Number: [id number] Patient Account Number: [id number] Date of Birth/Sex: Feb 07, 1956 (62 y.o. M) Treating RN: Cornell Barman Primary  Care Briget Shaheed: Frazier Richards Other Clinician: Referring Bralin Garry: Referral, Self Treating Rabon Scholle/Extender: Melburn Hake, HOYT Weeks in Treatment: 0 Active Inactive Abuse / Safety / Falls / Self Care Management Nursing Diagnoses: History of Falls Goals: Patient will not experience any injury related to falls Date Initiated: 10/12/2018 Target Resolution Date: 11/03/2018 Goal Status: Active Interventions: Assess fall risk on admission and as needed Notes: Orientation to the Wound Care Program Nursing Diagnoses: Knowledge deficit related to the wound healing center program Goals: Patient/caregiver will verbalize understanding of the [place] Program Date Initiated: 10/12/2018 Target Resolution Date: 11/03/2018 Goal Status: Active Interventions: Provide education on orientation to the wound  center Notes: Wound/Skin Impairment Nursing Diagnoses: Impaired tissue integrity Goals: Ulcer/skin breakdown will have a volume reduction of 30% by week 4 Date Initiated: 10/12/2018 Target Resolution Date: 11/13/2018 Goal Status: Active Interventions: Assess ulceration(s) every visit Paul Carpenter, Paul Carpenter ([id number]) Treatment Activities: Referred to DME Sadler Teschner for dressing supplies : 10/12/2018 Notes: Electronic Signature(s) Signed: 10/16/2018 11:07:52 AM By: Gretta Cool, BSN, RN, CWS, Kim RN, BSN Entered By: Gretta Cool, BSN, RN, CWS, Kim on 10/12/2018 12:36:57 Paul Carpenter ([id number]) -------------------------------------------------------------------------------- Pain Assessment Details Patient Name: Paul Carpenter Date of Service: 10/12/2018 11:45 AM Medical Record Number: [id number] Patient Account Number: [id number] Date of Birth/Sex: 07/06/1956 (62 y.o. M) Treating RN: Montey Hora Primary Care Anushri Casalino: Frazier Richards Other Clinician: Referring Kysa Calais: Referral, Self Treating Xiana Carns/Extender: Melburn Hake, HOYT Weeks in Treatment: 0 Active Problems Location of Pain Severity and Description of Pain Patient Has Paino Yes Site Locations Pain Location: Pain in Ulcers With Dressing Change: Yes Duration of the Pain. Constant / Intermittento Intermittent Character of Pain Describe the Pain: Other: stings Pain Management and Medication Current Pain Management: Electronic Signature(s) Signed: 10/13/2018 5:23:14 PM By: Montey Hora Entered By: Montey Hora on 10/12/2018 11:58:11 Paul Carpenter, Paul Carpenter ([id number]) -------------------------------------------------------------------------------- Patient/Caregiver Education Details Patient Name: Paul Carpenter Date of Service: 10/12/2018 11:45 AM Medical Record Number: [id number] Patient Account Number: [id number] Date of Birth/Gender: August 21, 1956 (62 y.o. M) Treating RN: Cornell Barman Primary Care Physician:  Frazier Richards Other Clinician: Referring Physician: Referral, Self Treating Physician/Extender: Sharalyn Ink in Treatment: 0 Education Assessment Education Provided To: Patient Education Topics Provided Infection: Handouts: Infection Prevention and Management Methods: Demonstration, Explain/Verbal Responses: State content correctly Welcome To [facility]: Handouts: Welcome To [facility] Methods: Demonstration, Explain/Verbal Responses: State content correctly Wound Debridement: Handouts: Wound Debridement Methods: Demonstration, Explain/Verbal Responses: State content correctly Wound/Skin Impairment: Handouts: Caring for Your Ulcer Methods: Demonstration, Explain/Verbal Responses: State content correctly Electronic Signature(s) Signed: 10/16/2018 11:07:52 AM By: Gretta Cool, BSN, RN, CWS, Kim RN, BSN Entered By: Gretta Cool, BSN, RN, CWS, Kim on 10/12/2018 12:40:35 Paul Carpenter ([id number]) -------------------------------------------------------------------------------- Wound Assessment Details Patient Name: Paul Carpenter Date of Service: 10/12/2018 11:45 AM Medical Record Number: [id number] Patient Account Number: [id number] Date of Birth/Sex: 1955-11-04 (62 y.o. M) Treating RN: Montey Hora Primary Care Alontae Chaloux: Frazier Richards Other Clinician: Referring Crystle Carelli: Referral, Self Treating Keishawn Rajewski/Extender: Melburn Hake, HOYT Weeks in Treatment: 0 Wound Status Wound Number: 1 Primary Skin Tear Etiology: Wound Location: Left Upper Arm Wound Open Wounding Event: Trauma Status: Date Acquired: 10/06/2018 Comorbid Anemia, Chronic Obstructive Pulmonary Disease Weeks Of Treatment: 0 History: (COPD), Hypertension, Neuropathy, Received Clustered Wound: No Chemotherapy, Received Radiation Photos Photo Uploaded By: Montey Hora on 10/12/2018 12:16:46 Wound Measurements Length: (cm) 8.2 Width: (cm) 2.7 Depth: (cm) 0.1 Area: (cm)  17.389 Volume: (cm) 1.739 % Reduction in Area: % Reduction in Volume: Epithelialization:  None Tunneling: No Undermining: No Wound Description Full Thickness Without Exposed Support Classification: Structures Wound Margin: Flat and Intact Exudate Medium Amount: Exudate Type: Serosanguineous Exudate Color: red, brown Foul Odor After Cleansing: No Slough/Fibrino Yes Wound Bed Granulation Amount: Small (1-33%) Exposed Structure Granulation Quality: Red Fascia Exposed: No Necrotic Amount: Medium (34-66%) Fat Layer (Subcutaneous Tissue) Exposed: Yes Necrotic Quality: Adherent Slough Tendon Exposed: No Muscle Exposed: No Joint Exposed: No Bone Exposed: No Carpenter, Paul N. ([id number]) Periwound Skin Texture Texture Color No Abnormalities Noted: No No Abnormalities Noted: No Callus: No Atrophie Blanche: No Crepitus: No Cyanosis: No Excoriation: No Ecchymosis: No Induration: No Erythema: No Rash: No Hemosiderin Staining: No Scarring: No Mottled: No Pallor: No Moisture Rubor: No No Abnormalities Noted: No Dry / Scaly: No Temperature / Pain Maceration: No Temperature: No Abnormality Tenderness on Palpation: Yes Wound Preparation Ulcer Cleansing: Rinsed/Irrigated with Saline Topical Anesthetic Applied: Other: lidocaine 4%, Treatment Notes Wound #1 (Left Upper Arm) 1. Cleansed with: Clean wound with Normal Saline 2. Anesthetic Topical Lidocaine 4% cream to wound bed prior to debridement 4. Dressing Applied: Xeroform 5. Secondary [place] Signature(s) Signed: 10/13/2018 5:23:14 PM By: Montey Hora Entered By: Montey Hora on 10/12/2018 12:11:35 Paul Carpenter ([id number]) -------------------------------------------------------------------------------- [place] Details Patient Name: Paul Carpenter Date of Service: 10/12/2018 11:45 AM Medical Record Number: [id number] Patient Account Number: [id number] Date of  Birth/Sex: [birth year] (62 y.o. M) Treating RN: Montey Hora Primary Care Anaiz Qazi: Frazier Richards Other Clinician: Referring Caylyn Tedeschi: Referral, Self Treating Raynell Upton/Extender: Melburn Hake, HOYT Weeks in Treatment: 0 Vital Signs Time Taken: 11:58 Temperature (F): 97.8 Height (in): 72 Pulse (bpm): 75 Source: Measured Respiratory Rate (breaths/min): 18 Weight (lbs): 206 Blood Pressure (mmHg): 130/80 Source: Measured Reference Range: 80 - 120 mg / dl Body Mass Index (BMI): 27.9 Electronic Signature(s) Signed: 10/13/2018 5:23:14 PM By: Montey Hora Entered By: Montey Hora on 10/12/2018 12:00:30

## 2018-10-16 NOTE — Progress Notes (Signed)
Paul Carpenter ([id number]) Visit Report for 10/12/2018 Chief Complaint Document Details Patient Name: Paul Carpenter, Paul Carpenter Date of Service: 10/12/2018 11:45 AM Medical Record Number: [id number] Patient Account Number: [id number] Date of Birth/Sex: 1956-03-23 (62 y.o. M) Treating RN: Cornell Barman Primary Care Provider: Frazier Richards Other Clinician: Referring Provider: Referral, Self Treating Provider/Extender: Melburn Hake, Lio Wehrly Weeks in Treatment: 0 Information Obtained from: Patient Chief Complaint Left upper arm skin tear Electronic Signature(s) Signed: 10/13/2018 8:01:46 PM By: [name] PA-C Entered By: [name] on 10/13/2018 18:26:52 Alabi, [name] ([id number]) -------------------------------------------------------------------------------- Debridement Details Patient Name: Paul Carpenter Date of Service: 10/12/2018 11:45 AM Medical Record Number: [id number] Patient Account Number: [id number] Date of Birth/Sex: 04/02/1956 (62 y.o. M) Treating RN: Cornell Barman Primary Care Provider: Frazier Richards Other Clinician: Referring Provider: Referral, Self Treating Provider/Extender: Melburn Hake, Devona Holmes Weeks in Treatment: 0 Debridement Performed for Wound #1 Left Upper Arm Assessment: Performed By: Physician STONE III, Earlin Sweeden E., PA-C Debridement Type: Debridement Level of Consciousness (Pre- Awake and Alert procedure): Pre-procedure Verification/Time Yes - 12:15 Out Taken: Start Time: 12:15 Pain Control: Lidocaine Total Area Debrided (L x W): 8.2 (cm) x 2.7 (cm) = 22.14 (cm) Tissue and other material Non-Viable, Skin: Dermis , Skin: Epidermis debrided: Level: Skin/Epidermis Debridement Description: Selective/Open Wound Instrument: Forceps, Scissors Bleeding: Minimum Hemostasis Achieved: Pressure End Time: 12:18 Procedural Pain: 1 Post Procedural Pain: 0 Response to Treatment: Procedure was tolerated well Level of Consciousness Awake and  Alert (Post-procedure): Post Debridement Measurements of Total Wound Length: (cm) 8.7 Width: (cm) 3 Depth: (cm) 0.1 Volume: (cm) 2.05 Character of Wound/Ulcer Post Debridement: Stable Post Procedure Diagnosis Same as Pre-procedure Electronic Signature(s) Signed: 10/13/2018 8:01:46 PM By: [name] PA-C Signed: 10/16/2018 11:07:52 AM By: Gretta Cool, BSN, RN, CWS, Kim RN, BSN Entered By: Gretta Cool, BSN, RN, CWS, Kim on 10/12/2018 12:38:22 VIRLAN, KEMPKER ([id number]) -------------------------------------------------------------------------------- HPI Details Patient Name: Paul Carpenter Date of Service: 10/12/2018 11:45 AM Medical Record Number: [id number] Patient Account Number: [id number] Date of Birth/Sex: 24-May-1956 (62 y.o. M) Treating RN: Cornell Barman Primary Care Provider: Frazier Richards Other Clinician: Referring Provider: Referral, Self Treating Provider/Extender: Melburn Hake, Czar Ysaguirre Weeks in Treatment: 0 History of Present Illness HPI Description: 10/12/18 on evaluation today patient presents for initial evaluation and clinic concerning an issue that he's been having for the past week with his left upper arm where he sustained a skin care. He knows that he did not want to go to an urgent care or the [facility] department due to the fact that he was afraid of contracting something infectious he is a current cancer patient is under the care of the cancer center. With that being said his wife was able to contact our office and fortunately we were able to work the patient in today for evaluation. He does have a history of non-small cell lung cancer on the right. He also has COPD as well as hypertension among other things. Fortunately though he is having discomfort at the site of the injury this does not appear to be too deep or too significant which is good news. I do believe it's something that can heal quite nicely with the appropriate care. He is currently on an antibiotic,  Augmentin, for a sinus infection. Electronic Signature(s) Signed: 10/13/2018 8:01:46 PM By: [name] PA-C Entered By: [name] on 10/13/2018 18:27:42 [name], [name] ([id number]) -------------------------------------------------------------------------------- Physical Exam Details Patient Name: Paul Carpenter Date of Service: 10/12/2018 11:45 AM Medical Record Number: [id number]  Patient Account Number: [id number] Date of Birth/Sex: 06-30-1956 (62 y.o. M) Treating RN: Cornell Barman Primary Care Provider: Frazier Richards Other Clinician: Referring Provider: Referral, Self Treating Provider/Extender: Melburn Hake, Tela Kotecki Weeks in Treatment: 0 Constitutional sitting or standing blood pressure is within target range for patient.. pulse regular and within target range for patient.Marland Kitchen respirations regular, non-labored and within target range for patient.Marland Kitchen temperature within target range for patient.. Well- nourished and well-hydrated in no acute distress. Eyes conjunctiva clear no eyelid edema noted. pupils equal round and reactive to light and accommodation. Ears, Nose, Mouth, and Throat no gross abnormality of ear auricles or external auditory canals. normal hearing noted during conversation. mucus membranes moist. Respiratory normal breathing without difficulty. clear to auscultation bilaterally. Cardiovascular regular rate and rhythm with normal S1, S2. no clubbing, cyanosis, significant edema, <3 sec cap refill. Gastrointestinal (GI) soft, non-tender, non-distended, +BS. no ventral hernia noted. Musculoskeletal normal gait and posture. no significant deformity or arthritic changes, no loss or range of motion, no clubbing. Psychiatric this patient is able to make decisions and demonstrates good insight into disease process. Alert and Oriented x 3. pleasant and cooperative. Notes Patient's wound upon examination today actually appears to have some necrotic tissue overlying  the surface of the wound were the skin with pillow back he did attempt to replace this into the proper location unfortunately it did not survive. Therefore at this point it appears that we're gonna have to sharply debride away the necrotic tissue to allow for appropriate healing of this wound. The patient is having discomfort we did none the area prior to debridement. Utilizing scissors and forceps I was able to sharply debride away the necrotic tissue from the surface of the wound. He tolerated this with some discomfort but fortunately nothing too significant. Electronic Signature(s) Signed: 10/13/2018 8:01:46 PM By: [name] PA-C Entered By: [name] on 10/13/2018 18:28:39 JARL, SELLITTO ([id number]) -------------------------------------------------------------------------------- Physician Orders Details Patient Name: Paul Carpenter Date of Service: 10/12/2018 11:45 AM Medical Record Number: [id number] Patient Account Number: [id number] Date of Birth/Sex: 22-Oct-1956 (62 y.o. M) Treating RN: Cornell Barman Primary Care Provider: Frazier Richards Other Clinician: Referring Provider: Referral, Self Treating Provider/Extender: Melburn Hake, Chantilly Linskey Weeks in Treatment: 0 Verbal / Phone Orders: No Diagnosis Coding Wound Cleansing Wound #1 Left Upper Arm o Clean wound with Normal Saline. o May Shower, gently pat wound dry prior to applying new dressing. Anesthetic (add to Medication List) Wound #1 Left Upper Arm o Topical Lidocaine 4% cream applied to wound bed prior to debridement (In Clinic Only). Primary Wound Dressing Wound #1 Left Upper Arm o Xeroform Secondary Dressing o [place] Dressing Change Frequency Wound #1 Left Upper Arm o Change dressing every day. Follow-up Appointments Wound #1 Left Upper Arm o Return Appointment in 1 week. Electronic Signature(s) Signed: 10/13/2018 8:01:46 PM By: [name] PA-C Signed: 10/16/2018 11:07:52 AM  By: Gretta Cool, BSN, RN, CWS, Kim RN, BSN Entered By: Gretta Cool, BSN, RN, CWS, Kim on 10/12/2018 12:39:31 SHELTON, SQUARE ([id number]) -------------------------------------------------------------------------------- Problem List Details Patient Name: Paul Carpenter Date of Service: 10/12/2018 11:45 AM Medical Record Number: [id number] Patient Account Number: [id number] Date of Birth/Sex: 1956-09-04 (62 y.o. M) Treating RN: Cornell Barman Primary Care Provider: Frazier Richards Other Clinician: Referring Provider: Referral, Self Treating Provider/Extender: Melburn Hake, Sabino Denning Weeks in Treatment: 0 Active Problems ICD-10 Evaluated Encounter Code Description Active Date Today Diagnosis S41.102A Unspecified open wound of left upper arm, initial encounter 10/13/2018 No Yes I10 Essential (primary)  hypertension 10/13/2018 No Yes C34.90 Malignant neoplasm of unspecified part of unspecified 10/13/2018 No Yes bronchus or lung Inactive Problems Resolved Problems Electronic Signature(s) Signed: 10/13/2018 8:01:46 PM By: [name] PA-C Entered By: [name] on 10/13/2018 18:25:41 Tatlock, [name] ([id number]) -------------------------------------------------------------------------------- Progress Note Details Patient Name: Paul Carpenter Date of Service: 10/12/2018 11:45 AM Medical Record Number: [id number] Patient Account Number: [id number] Date of Birth/Sex: 1956-09-13 (62 y.o. M) Treating RN: Cornell Barman Primary Care Provider: Frazier Richards Other Clinician: Referring Provider: Referral, Self Treating Provider/Extender: Melburn Hake, Rodrick Payson Weeks in Treatment: 0 Subjective Chief Complaint Information obtained from Patient Left upper arm skin tear History of Present Illness (HPI) 10/12/18 on evaluation today patient presents for initial evaluation and clinic concerning an issue that he's been having for the past week with his left upper arm where he sustained a skin care. He  knows that he did not want to go to an urgent care or the [facility] department due to the fact that he was afraid of contracting something infectious he is a current cancer patient is under the care of the cancer center. With that being said his wife was able to contact our office and fortunately we were able to work the patient in today for evaluation. He does have a history of non-small cell lung cancer on the right. He also has COPD as well as hypertension among other things. Fortunately though he is having discomfort at the site of the injury this does not appear to be too deep or too significant which is good news. I do believe it's something that can heal quite nicely with the appropriate care. He is currently on an antibiotic, Augmentin, for a sinus infection. Wound History Patient presents with 1 open wound that has been present for approximately 1 week. Patient has been treating wound in the following manner: vaseline and gauze. Laboratory tests have been performed in the last month. Patient reportedly has not tested positive for an antibiotic resistant organism. Patient reportedly has not tested positive for osteomyelitis. Patient reportedly has not had testing performed to evaluate circulation in the legs. Patient History Information obtained from Patient. Allergies No Known Drug Allergies Family History Cancer - Child,Father, Diabetes - Paternal Grandparents, Heart Disease - Father, Hypertension - Father, Lung Disease - Father, Stroke - Father, No family history of Hereditary Spherocytosis, Kidney Disease, Seizures, Thyroid Problems, Tuberculosis. Social History Current every day smoker, Marital Status - Married, Alcohol Use - Moderate, Drug Use - No History, Caffeine Use - Daily. Medical History Eyes Denies history of Cataracts, Glaucoma, Optic Neuritis Ear/Nose/Mouth/Throat Denies history of Chronic sinus problems/congestion, Middle ear problems Hematologic/Lymphatic Patient has  history of Anemia Denies history of Hemophilia, Human Immunodeficiency Virus, Lymphedema, Sickle Cell Disease Respiratory Patient has history of Chronic Obstructive Pulmonary Disease (COPD) Hutsell, Valerian N. ([id number]) Denies history of Aspiration, Asthma, Pneumothorax, Sleep Apnea, Tuberculosis Cardiovascular Patient has history of Hypertension Denies history of Angina, Arrhythmia, Congestive Heart Failure, Coronary Artery Disease, Deep Vein Thrombosis, Hypotension, Myocardial Infarction, Peripheral Arterial Disease, Peripheral Venous Disease, Phlebitis, Vasculitis Gastrointestinal Denies history of Cirrhosis , Colitis, Crohn s, Hepatitis A, Hepatitis B, Hepatitis C Endocrine Denies history of Type I Diabetes, Type II Diabetes Genitourinary Denies history of End Stage Renal Disease Immunological Denies history of Lupus Erythematosus, Raynaud s, Scleroderma Integumentary (Skin) Denies history of History of Burn, History of pressure wounds Musculoskeletal Denies history of Gout, Rheumatoid Arthritis, Osteoarthritis, Osteomyelitis Neurologic Patient has history of Neuropathy Denies history of Dementia, Quadriplegia, Paraplegia, Seizure  Disorder Oncologic Patient has history of Received Chemotherapy, Received Radiation - 37 treatments Psychiatric Denies history of Anorexia/bulimia, Confinement Anxiety Medical And Surgical History Notes Respiratory stage 3 lung cancer Cardiovascular irregular heartbeat Review of Systems (ROS) Constitutional Symptoms (General Health) Denies complaints or symptoms of Fatigue, Fever, Chills, Marked Weight Change. Eyes Denies complaints or symptoms of Dry Eyes, Vision Changes, Glasses / Contacts. Ear/Nose/Mouth/Throat Denies complaints or symptoms of Difficult clearing ears, Sinusitis. Hematologic/Lymphatic Denies complaints or symptoms of Bleeding / Clotting Disorders, Human Immunodeficiency Virus. Respiratory Complains or has symptoms of  Shortness of Breath. Denies complaints or symptoms of Chronic or frequent coughs. Cardiovascular Denies complaints or symptoms of Chest pain, LE edema. Gastrointestinal Complains or has symptoms of Nausea. Denies complaints or symptoms of Frequent diarrhea, Vomiting. Endocrine Denies complaints or symptoms of Hepatitis, Thyroid disease, Polydypsia (Excessive Thirst). Genitourinary Denies complaints or symptoms of Kidney failure/ Dialysis, Incontinence/dribbling. Immunological Denies complaints or symptoms of Hives, Itching. Integumentary (Skin) Complains or has symptoms of Wounds. Denies complaints or symptoms of Bleeding or bruising tendency, Breakdown, Swelling. Musculoskeletal Denies complaints or symptoms of Muscle Pain, Muscle Weakness. Neurologic Chrisman, HERMAN FIERO. ([id number]) Denies complaints or symptoms of Numbness/parasthesias, Focal/Weakness. Psychiatric Denies complaints or symptoms of Anxiety, Claustrophobia. Objective Constitutional sitting or standing blood pressure is within target range for patient.. pulse regular and within target range for patient.Marland Kitchen respirations regular, non-labored and within target range for patient.Marland Kitchen temperature within target range for patient.. Well- nourished and well-hydrated in no acute distress. Vitals Time Taken: 11:58 AM, Height: 72 in, Source: Measured, Weight: 206 lbs, Source: Measured, BMI: 27.9, Temperature: 97.8 F, Pulse: 75 bpm, Respiratory Rate: 18 breaths/min, Blood Pressure: 130/80 mmHg. Eyes conjunctiva clear no eyelid edema noted. pupils equal round and reactive to light and accommodation. Ears, Nose, Mouth, and Throat no gross abnormality of ear auricles or external auditory canals. normal hearing noted during conversation. mucus membranes moist. Respiratory normal breathing without difficulty. clear to auscultation bilaterally. Cardiovascular regular rate and rhythm with normal S1, S2. no clubbing, cyanosis,  significant edema, Gastrointestinal (GI) soft, non-tender, non-distended, +BS. no ventral hernia noted. Musculoskeletal normal gait and posture. no significant deformity or arthritic changes, no loss or range of motion, no clubbing. Psychiatric this patient is able to make decisions and demonstrates good insight into disease process. Alert and Oriented x 3. pleasant and cooperative. General Notes: Patient's wound upon examination today actually appears to have some necrotic tissue overlying the surface of the wound were the skin with pillow back he did attempt to replace this into the proper location unfortunately it did not survive. Therefore at this point it appears that we're gonna have to sharply debride away the necrotic tissue to allow for appropriate healing of this wound. The patient is having discomfort we did none the area prior to debridement. Utilizing scissors and forceps I was able to sharply debride away the necrotic tissue from the surface of the wound. He tolerated this with some discomfort but fortunately nothing too significant. Integumentary (Hair, Skin) Wound #1 status is Open. Original cause of wound was Trauma. The wound is located on the Left Upper Arm. The wound measures 8.2cm length x 2.7cm width x 0.1cm depth; 17.389cm^2 area and 1.739cm^3 volume. There is Fat Layer (Subcutaneous Tissue) Exposed exposed. There is no tunneling or undermining noted. There is a medium amount of serosanguineous drainage noted. The wound margin is flat and intact. There is small (1-33%) red granulation within the wound bed. There is a medium (34-66%) amount of necrotic tissue  within the wound bed including Adherent Slough. The AYOUB, AREY ([id number]) periwound skin appearance did not exhibit: Callus, Crepitus, Excoriation, Induration, Rash, Scarring, Dry/Scaly, Maceration, Atrophie Blanche, Cyanosis, Ecchymosis, Hemosiderin Staining, Mottled, Pallor, Rubor, Erythema. Periwound  temperature was noted as No Abnormality. The periwound has tenderness on palpation. Assessment Active Problems ICD-10 Unspecified open wound of left upper arm, initial encounter Essential (primary) hypertension Malignant neoplasm of unspecified part of unspecified bronchus or lung Procedures Wound #1 Pre-procedure diagnosis of Wound #1 is a Skin Tear located on the Left Upper Arm . There was a Selective/Open Wound Skin/Epidermis Debridement with a total area of 22.14 sq cm performed by STONE III, Ezariah Nace E., PA-C. With the following instrument(s): Forceps, and Scissors to remove Non-Viable tissue/material. Material removed includes Skin: Dermis and Skin: Epidermis and after achieving pain control using Lidocaine. No specimens were taken. A time out was conducted at 12:15, prior to the start of the procedure. A Minimum amount of bleeding was controlled with Pressure. The procedure was tolerated well with a pain level of 1 throughout and a pain level of 0 following the procedure. Post Debridement Measurements: 8.7cm length x 3cm width x 0.1cm depth; 2.05cm^3 volume. Character of Wound/Ulcer Post Debridement is stable. Post procedure Diagnosis Wound #1: Same as Pre-Procedure Plan Wound Cleansing: Wound #1 Left Upper Arm: Clean wound with Normal Saline. May Shower, gently pat wound dry prior to applying new dressing. Anesthetic (add to Medication List): Wound #1 Left Upper Arm: Topical Lidocaine 4% cream applied to wound bed prior to debridement (In Clinic Only). Primary Wound Dressing: Wound #1 Left Upper Arm: Xeroform Secondary Dressing: [place] Dressing Change Frequency: Wound #1 Left Upper Arm: Change dressing every day. Follow-up Appointments: KALIK, HOARE ([id number]) Wound #1 Left Upper Arm: Return Appointment in 1 week. Post debridement the wound bed appears to be doing much better and I think it has an excellent chance at healing currently. The patient was very  thankful for [place] being able to work in in today. I do want to see him in a week to ensure everything is still doing okay he is definitely in agreement with that plan. If anything changes in the meantime he will contact the office and let me know. Please see above for specific wound care orders. We will see patient for re-evaluation in 1 week(s) here in the clinic. If anything worsens or changes patient will contact our office for additional recommendations. Electronic Signature(s) Signed: 10/13/2018 8:01:46 PM By: [name] PA-C Entered By: [name] on 10/13/2018 18:29:13 AREN, PRYDE ([id number]) -------------------------------------------------------------------------------- ROS/PFSH Details Patient Name: Paul Carpenter Date of Service: 10/12/2018 11:45 AM Medical Record Number: [id number] Patient Account Number: [id number] Date of Birth/Sex: [birth year] (62 y.o. M) Treating RN: Montey Hora Primary Care Provider: Frazier Richards Other Clinician: Referring Provider: Referral, Self Treating Provider/Extender: Melburn Hake, Morrell Fluke Weeks in Treatment: 0 Information Obtained From Patient Wound History Do you currently have one or more open woundso Yes How many open wounds do you currently haveo 1 Approximately how long have you had your woundso 1 week How have you been treating your wound(s) until nowo vaseline and gauze Has your wound(s) ever healed and then re-openedo No Have you had any lab work done in the past montho Yes Who ordered the lab work doneo cancer center Have you tested positive for an antibiotic resistant organism (MRSA, VRE)o No Have you tested positive for osteomyelitis (bone infection)o No Have you had any tests for circulation on your  legso No Constitutional Symptoms (General Health) Complaints and Symptoms: Negative for: Fatigue; Fever; Chills; Marked Weight Change Eyes Complaints and Symptoms: Negative for: Dry Eyes; Vision Changes; Glasses  / Contacts Medical History: Negative for: Cataracts; Glaucoma; Optic Neuritis Ear/Nose/Mouth/Throat Complaints and Symptoms: Negative for: Difficult clearing ears; Sinusitis Medical History: Negative for: Chronic sinus problems/congestion; Middle ear problems Hematologic/Lymphatic Complaints and Symptoms: Negative for: Bleeding / Clotting Disorders; Human Immunodeficiency Virus Medical History: Positive for: Anemia Negative for: Hemophilia; Human Immunodeficiency Virus; Lymphedema; Sickle Cell Disease Respiratory Complaints and Symptoms: Positive for: Shortness of Breath Negative for: Chronic or frequent coughs Nestor, Slayton N. ([id number]) Medical History: Positive for: Chronic Obstructive Pulmonary Disease (COPD) Negative for: Aspiration; Asthma; Pneumothorax; Sleep Apnea; Tuberculosis Past Medical History Notes: stage 3 lung cancer Cardiovascular Complaints and Symptoms: Negative for: Chest pain; LE edema Medical History: Positive for: Hypertension Negative for: Angina; Arrhythmia; Congestive Heart Failure; Coronary Artery Disease; Deep Vein Thrombosis; Hypotension; Myocardial Infarction; Peripheral Arterial Disease; Peripheral Venous Disease; Phlebitis; Vasculitis Past Medical History Notes: irregular heartbeat Gastrointestinal Complaints and Symptoms: Positive for: Nausea Negative for: Frequent diarrhea; Vomiting Medical History: Negative for: Cirrhosis ; Colitis; Crohnos; Hepatitis A; Hepatitis B; Hepatitis C Endocrine Complaints and Symptoms: Negative for: Hepatitis; Thyroid disease; Polydypsia (Excessive Thirst) Medical History: Negative for: Type I Diabetes; Type II Diabetes Genitourinary Complaints and Symptoms: Negative for: Kidney failure/ Dialysis; Incontinence/dribbling Medical History: Negative for: End Stage Renal Disease Immunological Complaints and Symptoms: Negative for: Hives; Itching Medical History: Negative for: Lupus Erythematosus;  Raynaudos; Scleroderma Integumentary (Skin) Complaints and Symptoms: Positive for: Wounds Negative for: Bleeding or bruising tendency; Breakdown; Swelling Medical HistoryLYLE, NIBLETT ([id number]) Negative for: History of Burn; History of pressure wounds Musculoskeletal Complaints and Symptoms: Negative for: Muscle Pain; Muscle Weakness Medical History: Negative for: Gout; Rheumatoid Arthritis; Osteoarthritis; Osteomyelitis Neurologic Complaints and Symptoms: Negative for: Numbness/parasthesias; Focal/Weakness Medical History: Positive for: Neuropathy Negative for: Dementia; Quadriplegia; Paraplegia; Seizure Disorder Psychiatric Complaints and Symptoms: Negative for: Anxiety; Claustrophobia Medical History: Negative for: Anorexia/bulimia; Confinement Anxiety Oncologic Medical History: Positive for: Received Chemotherapy; Received Radiation - 37 treatments Immunizations Pneumococcal Vaccine: Received Pneumococcal Vaccination: Yes Implantable Devices Family and Social History Cancer: Yes - Child,Father; Diabetes: Yes - Paternal Grandparents; Heart Disease: Yes - Father; Hereditary Spherocytosis: No; Hypertension: Yes - Father; Kidney Disease: No; Lung Disease: Yes - Father; Seizures: No; Stroke: Yes - Father; Thyroid Problems: No; Tuberculosis: No; Current every day smoker; Marital Status - Married; Alcohol Use: Moderate; Drug Use: No History; Caffeine Use: Daily; Financial Concerns: No; Food, Clothing or Shelter Needs: No; Support System Lacking: No; Transportation Concerns: No; Advanced Directives: No; Patient does not want information on Advanced Directives Electronic Signature(s) Signed: 10/13/2018 5:23:14 PM By: Montey Hora Signed: 10/13/2018 8:01:46 PM By: [name] PA-C Entered By: Montey Hora on 10/12/2018 12:09:06 Paul Carpenter ([id number]) -------------------------------------------------------------------------------- SuperBill  Details Patient Name: Paul Carpenter Date of Service: 10/12/2018 Medical Record Number: [id number] Patient Account Number: [id number] Date of Birth/Sex: December 11, 1955 (62 y.o. M) Treating RN: Cornell Barman Primary Care Provider: Frazier Richards Other Clinician: Referring Provider: Referral, Self Treating Provider/Extender: Melburn Hake, Cathy Crounse Weeks in Treatment: 0 Diagnosis Coding ICD-10 Codes Code Description S41.102A Unspecified open wound of left upper arm, initial encounter I10 Essential (primary) hypertension C34.90 Malignant neoplasm of unspecified part of unspecified bronchus or lung Facility Procedures CPT4 Code: 09470962 Description: 99213 - WOUND CARE VISIT-LEV 3 EST PT Modifier: Quantity: 1 CPT4 Code: 83662947 Description: 65465 - DEBRIDE WOUND 1ST 20 SQ CM OR < ICD-10 Diagnosis Description  S41.102A Unspecified open wound of left upper arm, initial encounter Modifier: Quantity: 1 CPT4 Code: 11173567 Description: 01410 - DEBRIDE WOUND EA ADDL 20 SQ CM ICD-10 Diagnosis Description S41.102A Unspecified open wound of left upper arm, initial encounter Modifier: Quantity: 1 Physician Procedures CPT4 Code: 3013143 Description: WC PHYS LEVEL 3 o NEW PT ICD-10 Diagnosis Description S41.102A Unspecified open wound of left upper arm, initial encounter I10 Essential (primary) hypertension C34.90 Malignant neoplasm of unspecified part of unspecified bronchus or Modifier: 25 lung Quantity: 1 CPT4 Code: 8887579 Description: [place] - WC PHYS DEBR WO ANESTH 20 SQ CM ICD-10 Diagnosis Description S41.102A Unspecified open wound of left upper arm, initial encounter Modifier: Quantity: 1 CPT4 Code: 7282060 Description: [place] - WC PHYS DEBR WO ANESTH EA ADD 20 CM ICD-10 Diagnosis Description S41.102A Unspecified open wound of left upper arm, initial encounter Modifier: Quantity: 1 Electronic Signature(s) Signed: 10/13/2018 8:01:46 PM By: [name] PA-C Entered By: [name] on  10/13/2018 18:29:43

## 2018-10-18 ENCOUNTER — Inpatient Hospital Stay (HOSPITAL_BASED_OUTPATIENT_CLINIC_OR_DEPARTMENT_OTHER): Payer: BLUE CROSS/BLUE SHIELD | Admitting: Oncology

## 2018-10-18 ENCOUNTER — Inpatient Hospital Stay: Payer: BLUE CROSS/BLUE SHIELD

## 2018-10-18 VITALS — BP 129/79 | HR 72 | Temp 97.8°F | Wt 208.4 lb

## 2018-10-18 DIAGNOSIS — C3491 Malignant neoplasm of unspecified part of right bronchus or lung: Secondary | ICD-10-CM

## 2018-10-18 DIAGNOSIS — K219 Gastro-esophageal reflux disease without esophagitis: Secondary | ICD-10-CM

## 2018-10-18 DIAGNOSIS — Z9981 Dependence on supplemental oxygen: Secondary | ICD-10-CM | POA: Diagnosis not present

## 2018-10-18 DIAGNOSIS — Z79899 Other long term (current) drug therapy: Secondary | ICD-10-CM

## 2018-10-18 DIAGNOSIS — Z8042 Family history of malignant neoplasm of prostate: Secondary | ICD-10-CM

## 2018-10-18 DIAGNOSIS — F17210 Nicotine dependence, cigarettes, uncomplicated: Secondary | ICD-10-CM

## 2018-10-18 DIAGNOSIS — J449 Chronic obstructive pulmonary disease, unspecified: Secondary | ICD-10-CM

## 2018-10-18 DIAGNOSIS — R531 Weakness: Secondary | ICD-10-CM

## 2018-10-18 DIAGNOSIS — I7300 Raynaud's syndrome without gangrene: Secondary | ICD-10-CM

## 2018-10-18 DIAGNOSIS — E871 Hypo-osmolality and hyponatremia: Secondary | ICD-10-CM

## 2018-10-18 DIAGNOSIS — Z86010 Personal history of colonic polyps: Secondary | ICD-10-CM

## 2018-10-18 DIAGNOSIS — C342 Malignant neoplasm of middle lobe, bronchus or lung: Secondary | ICD-10-CM

## 2018-10-18 DIAGNOSIS — R5382 Chronic fatigue, unspecified: Secondary | ICD-10-CM

## 2018-10-18 DIAGNOSIS — R5381 Other malaise: Secondary | ICD-10-CM

## 2018-10-18 DIAGNOSIS — F419 Anxiety disorder, unspecified: Secondary | ICD-10-CM

## 2018-10-18 LAB — CBC WITH DIFFERENTIAL/PLATELET
Abs Immature Granulocytes: 0.03 10*3/uL (ref 0.00–0.07)
BASOS ABS: 0 10*3/uL (ref 0.0–0.1)
Basophils Relative: 0 %
EOS ABS: 0.1 10*3/uL (ref 0.0–0.5)
Eosinophils Relative: 2 %
HEMATOCRIT: 38.9 % — AB (ref 39.0–52.0)
HEMOGLOBIN: 13.5 g/dL (ref 13.0–17.0)
Immature Granulocytes: 0 %
LYMPHS ABS: 0.7 10*3/uL (ref 0.7–4.0)
LYMPHS PCT: 9 %
MCH: 34.4 pg — ABNORMAL HIGH (ref 26.0–34.0)
MCHC: 34.7 g/dL (ref 30.0–36.0)
MCV: 99.2 fL (ref 80.0–100.0)
Monocytes Absolute: 0.7 10*3/uL (ref 0.1–1.0)
Monocytes Relative: 9 %
NRBC: 0 % (ref 0.0–0.2)
Neutro Abs: 6.6 10*3/uL (ref 1.7–7.7)
Neutrophils Relative %: 80 %
Platelets: 208 10*3/uL (ref 150–400)
RBC: 3.92 MIL/uL — ABNORMAL LOW (ref 4.22–5.81)
RDW: 11.6 % (ref 11.5–15.5)
WBC: 8.2 10*3/uL (ref 4.0–10.5)

## 2018-10-18 LAB — COMPREHENSIVE METABOLIC PANEL
ALT: 20 U/L (ref 0–44)
ANION GAP: 7 (ref 5–15)
AST: 22 U/L (ref 15–41)
Albumin: 3.9 g/dL (ref 3.5–5.0)
Alkaline Phosphatase: 84 U/L (ref 38–126)
BUN: 9 mg/dL (ref 8–23)
CHLORIDE: 98 mmol/L (ref 98–111)
CO2: 27 mmol/L (ref 22–32)
Calcium: 9.2 mg/dL (ref 8.9–10.3)
Creatinine, Ser: 0.76 mg/dL (ref 0.61–1.24)
GFR calc non Af Amer: >60 mL/min (ref >60)
Glucose, Bld: 123 mg/dL — ABNORMAL HIGH (ref 70–99)
Potassium: 4 mmol/L (ref 3.5–5.1)
SODIUM: 132 mmol/L — AB (ref 135–145)
Total Bilirubin: 0.5 mg/dL (ref 0.3–1.2)
Total Protein: 6.8 g/dL (ref 6.5–8.1)

## 2018-10-18 MED ORDER — SODIUM CHLORIDE 0.9% FLUSH
10.0000 mL | Freq: Once | INTRAVENOUS | Status: AC
  Administered 2018-10-18: 10 mL via INTRAVENOUS
  Filled 2018-10-18: qty 10

## 2018-10-18 MED ORDER — HEPARIN SOD (PORK) LOCK FLUSH 100 UNIT/ML IV SOLN
500.0000 [IU] | Freq: Once | INTRAVENOUS | Status: AC
  Administered 2018-10-18: 500 [IU] via INTRAVENOUS
  Filled 2018-10-18: qty 5

## 2018-10-18 MED ORDER — DEXAMETHASONE SODIUM PHOSPHATE 10 MG/ML IJ SOLN
10.0000 mg | Freq: Once | INTRAMUSCULAR | Status: AC
  Administered 2018-10-18: 10 mg via INTRAVENOUS
  Filled 2018-10-18: qty 1

## 2018-10-18 MED ORDER — HEPARIN SOD (PORK) LOCK FLUSH 100 UNIT/ML IV SOLN
INTRAVENOUS | Status: AC
  Filled 2018-10-18: qty 5

## 2018-10-18 MED ORDER — DIPHENHYDRAMINE HCL 50 MG/ML IJ SOLN
25.0000 mg | Freq: Once | INTRAMUSCULAR | Status: AC
  Administered 2018-10-18: 25 mg via INTRAVENOUS
  Filled 2018-10-18: qty 1

## 2018-10-18 MED ORDER — SODIUM CHLORIDE 0.9 % IV SOLN
1000.0000 mg | Freq: Once | INTRAVENOUS | Status: AC
  Administered 2018-10-18: 1000 mg via INTRAVENOUS
  Filled 2018-10-18: qty 20

## 2018-10-18 MED ORDER — SODIUM CHLORIDE 0.9 % IV SOLN
Freq: Once | INTRAVENOUS | Status: AC
  Administered 2018-10-18: 11:00:00 via INTRAVENOUS
  Filled 2018-10-18: qty 250

## 2018-10-18 NOTE — Progress Notes (Signed)
Patient is here today to follow up on his non-small cell lung cancer, right. Patient stated that he has not had energy. Patient also stated that he felt dizzy and fell last week and had to go to Urgent Care but doing better now.

## 2018-10-19 ENCOUNTER — Ambulatory Visit: Payer: BLUE CROSS/BLUE SHIELD | Admitting: Physician Assistant

## 2018-10-19 ENCOUNTER — Encounter: Payer: BLUE CROSS/BLUE SHIELD | Admitting: Physician Assistant

## 2018-10-19 DIAGNOSIS — S41102A Unspecified open wound of left upper arm, initial encounter: Secondary | ICD-10-CM | POA: Diagnosis not present

## 2018-10-22 NOTE — Progress Notes (Signed)
MINA, CARLISI ([id number]) Visit Report for 10/19/2018 Arrival Information Details Patient Name: Paul Carpenter, Paul Carpenter Date of Service: 10/19/2018 3:45 PM Medical Record Number: [id number] Patient Account Number: [id number] Date of Birth/Sex: Jul 28, 1956 (62 y.o. M) Treating RN: Secundino Ginger Primary Care Bevan Vu: Frazier Richards Other Clinician: Referring Mehmet Scally: Frazier Richards Treating Adamariz Gillott/Extender: Melburn Hake, HOYT Weeks in Treatment: 1 Visit Information History Since Last Visit Added or deleted any medications: No Patient Arrived: Ambulatory Any new allergies or adverse reactions: No Arrival Time: 15:54 Had a fall or experienced change in No Accompanied By: self activities of daily living that may affect Transfer Assistance: None risk of falls: Patient Identification Verified: Yes Signs or symptoms of abuse/neglect since last visito No Secondary Verification Process Completed: Yes Hospitalized since last visit: No Implantable device outside of the clinic excluding No cellular tissue based products placed in the center since last visit: Has Dressing in Place as Prescribed: Yes Pain Present Now: No Electronic Signature(s) Signed: 10/19/2018 5:12:49 PM By: Secundino Ginger Entered By: Secundino Ginger on 10/19/2018 15:54:55 [name], [name] ([id number]) -------------------------------------------------------------------------------- Clinic Level of Care Assessment Details Patient Name: Paul Carpenter Date of Service: 10/19/2018 3:45 PM Medical Record Number: [id number] Patient Account Number: [id number] Date of Birth/Sex: 1956/09/18 (62 y.o. M) Treating RN: Montey Hora Primary Care Lorene Samaan: Frazier Richards Other Clinician: Referring Kenta Laster: Frazier Richards Treating Kamrynn Melott/Extender: Melburn Hake, HOYT Weeks in Treatment: 1 Clinic Level of Care Assessment Items TOOL 4 Quantity Score []  - Use when only an EandM is performed on FOLLOW-UP visit 0 ASSESSMENTS -  Nursing Assessment / Reassessment X - Reassessment of Co-morbidities (includes updates in patient status) 1 10 X- 1 5 Reassessment of Adherence to Treatment Plan ASSESSMENTS - Wound and Skin Assessment / Reassessment X - Simple Wound Assessment / Reassessment - one wound 1 5 []  - 0 Complex Wound Assessment / Reassessment - multiple wounds []  - 0 Dermatologic / Skin Assessment (not related to wound area) ASSESSMENTS - Focused Assessment []  - Circumferential Edema Measurements - multi extremities 0 []  - 0 Nutritional Assessment / Counseling / Intervention []  - 0 Lower Extremity Assessment (monofilament, tuning fork, pulses) []  - 0 Peripheral Arterial Disease Assessment (using hand held doppler) ASSESSMENTS - Ostomy and/or Continence Assessment and Care []  - Incontinence Assessment and Management 0 []  - 0 Ostomy Care Assessment and Management (repouching, etc.) PROCESS - Coordination of Care X - Simple Patient / Family Education for ongoing care 1 15 []  - 0 Complex (extensive) Patient / Family Education for ongoing care []  - 0 Staff obtains Programmer, systems, Records, Test Results / Process Orders []  - 0 Staff telephones HHA, Nursing Homes / Clarify orders / etc []  - 0 Routine Transfer to another Facility (non-emergent condition) []  - 0 Routine Hospital Admission (non-emergent condition) []  - 0 New Admissions / Biomedical engineer / Ordering NPWT, Apligraf, etc. []  - 0 Emergency Hospital Admission (emergent condition) X- 1 10 Simple Discharge Coordination [name], HOLTZER ([id number]) []  - 0 Complex (extensive) Discharge Coordination PROCESS - Special Needs []  - Pediatric / Minor Patient Management 0 []  - 0 Isolation Patient Management []  - 0 Hearing / Language / Visual special needs []  - 0 Assessment of Community assistance (transportation, D/C planning, etc.) []  - 0 Additional assistance / Altered mentation []  - 0 Support Surface(s) Assessment (bed, cushion, seat,  etc.) INTERVENTIONS - Wound Cleansing / Measurement X - Simple Wound Cleansing - one wound 1 5 []  - 0 Complex Wound Cleansing - multiple wounds X- 1 5 Wound Imaging (  photographs - any number of wounds) []  - 0 Wound Tracing (instead of photographs) X- 1 5 Simple Wound Measurement - one wound []  - 0 Complex Wound Measurement - multiple wounds INTERVENTIONS - Wound Dressings X - Small Wound Dressing one or multiple wounds 1 10 []  - 0 Medium Wound Dressing one or multiple wounds []  - 0 Large Wound Dressing one or multiple wounds []  - 0 Application of Medications - topical []  - 0 Application of Medications - injection INTERVENTIONS - Miscellaneous []  - External ear exam 0 []  - 0 Specimen Collection (cultures, biopsies, blood, body fluids, etc.) []  - 0 Specimen(s) / Culture(s) sent or taken to Lab for analysis []  - 0 Patient Transfer (multiple staff / Civil Service fast streamer / Similar devices) []  - 0 Simple Staple / Suture removal (25 or less) []  - 0 Complex Staple / Suture removal (26 or more) []  - 0 Hypo / Hyperglycemic Management (close monitor of Blood Glucose) []  - 0 Ankle / Brachial Index (ABI) - do not check if billed separately X- 1 5 Vital Signs [name], [name] ([id number]) Has the patient been seen at the hospital within the last three years: Yes Total Score: 75 Level Of Care: New/Established - Level 2 Electronic Signature(s) Signed: 10/20/2018 5:12:41 PM By: Montey Hora Entered By: Montey Hora on 10/19/2018 17:13:36 [name], [name] ([id number]) -------------------------------------------------------------------------------- Lower Extremity Assessment Details Patient Name: Paul Carpenter Date of Service: 10/19/2018 3:45 PM Medical Record Number: [id number] Patient Account Number: [id number] Date of Birth/Sex: [birth year] (62 y.o. M) Treating RN: Secundino Ginger Primary Care Nazli Penn: Frazier Richards Other Clinician: Referring Yonah Tangeman: Frazier Richards Treating Eliyana Pagliaro/Extender: Melburn Hake, HOYT Weeks in Treatment: 1 Electronic Signature(s) Signed: 10/19/2018 5:12:49 PM By: Secundino Ginger Entered By: Secundino Ginger on 10/19/2018 15:55:48 Chenette, [name] ([id number]) -------------------------------------------------------------------------------- Multi Wound Chart Details Patient Name: Paul Carpenter Date of Service: 10/19/2018 3:45 PM Medical Record Number: [id number] Patient Account Number: [id number] Date of Birth/Sex: 11/25/1955 (62 y.o. M) Treating RN: Montey Hora Primary Care Phinehas Grounds: Frazier Richards Other Clinician: Referring Saturnino Liew: Frazier Richards Treating Danisa Kopec/Extender: Melburn Hake, HOYT Weeks in Treatment: 1 Vital Signs Height(in): 72 Pulse(bpm): 17 Weight(lbs): 206 Blood Pressure(mmHg): 136/75 Body Mass Index(BMI): 28 Temperature(F): 97.8 Respiratory Rate 16 (breaths/min): Photos: [1:No Photos] [N/A:N/A] Wound Location: [1:Left Upper Arm] [N/A:N/A] Wounding Event: [1:Trauma] [N/A:N/A] Primary Etiology: [1:Skin Tear] [N/A:N/A] Comorbid History: [1:Anemia, Chronic Obstructive Pulmonary Disease (COPD), Hypertension, Neuropathy, Received Chemotherapy, Received Radiation] [N/A:N/A] Date Acquired: [1:10/06/2018] [N/A:N/A] Weeks of Treatment: [1:1] [N/A:N/A] Wound Status: [1:Open] [N/A:N/A] Measurements L x W x D [1:0.4x0.4x0.1] [N/A:N/A] (cm) Area (cm) : [1:0.126] [N/A:N/A] Volume (cm) : [1:0.013] [N/A:N/A] % Reduction in Area: [1:99.30%] [N/A:N/A] % Reduction in Volume: [1:99.30%] [N/A:N/A] Classification: [1:Full Thickness Without Exposed Support Structures] [N/A:N/A] Exudate Amount: [1:Small] [N/A:N/A] Exudate Type: [1:Sanguinous] [N/A:N/A] Exudate Color: [1:red] [N/A:N/A] Wound Margin: [1:Flat and Intact] [N/A:N/A] Granulation Amount: [1:None Present (0%)] [N/A:N/A] Necrotic Amount: [1:None Present (0%)] [N/A:N/A] Exposed Structures: [1:Fat Layer (Subcutaneous Tissue) Exposed: Yes  Fascia: No Tendon: No Muscle: No Joint: No Bone: No] [N/A:N/A] Epithelialization: [1:None] [N/A:N/A] Periwound Skin Texture: [1:Excoriation: No Induration: No Callus: No] [N/A:N/A] Crepitus: No Rash: No Scarring: No Periwound Skin Moisture: Maceration: No N/A N/A Dry/Scaly: No Periwound Skin Color: Atrophie Blanche: No N/A N/A Cyanosis: No Ecchymosis: No Erythema: No Hemosiderin Staining: No Mottled: No Pallor: No Rubor: No Temperature: No Abnormality N/A N/A Tenderness on Palpation: Yes N/A N/A Wound Preparation: Ulcer Cleansing: N/A N/A Rinsed/Irrigated with Saline Topical Anesthetic Applied: Other: lidocaine 4% Treatment Notes Electronic Signature(s)  Signed: 10/20/2018 5:12:41 PM By: Montey Hora Entered By: Montey Hora on 10/19/2018 16:40:39 Paul Carpenter ([id number]) -------------------------------------------------------------------------------- Multi-Disciplinary Care Plan Details Patient Name: Paul Carpenter Date of Service: 10/19/2018 3:45 PM Medical Record Number: [id number] Patient Account Number: [id number] Date of Birth/Sex: May 06, 1956 (62 y.o. M) Treating RN: Montey Hora Primary Care Tessia Kassin: Frazier Richards Other Clinician: Referring Shaylie Eklund: Frazier Richards Treating Atlantis Delong/Extender: Melburn Hake, HOYT Weeks in Treatment: 1 Active Inactive Abuse / Safety / Falls / Self Care Management Nursing Diagnoses: History of Falls Goals: Patient will not experience any injury related to falls Date Initiated: 10/12/2018 Target Resolution Date: 11/03/2018 Goal Status: Active Interventions: Assess fall risk on admission and as needed Notes: Orientation to the Wound Care Program Nursing Diagnoses: Knowledge deficit related to the wound healing center program Goals: Patient/caregiver will verbalize understanding of the [facility] Program Date Initiated: 10/12/2018 Target Resolution Date: 11/03/2018 Goal Status:  Active Interventions: Provide education on orientation to the wound center Notes: Wound/Skin Impairment Nursing Diagnoses: Impaired tissue integrity Goals: Ulcer/skin breakdown will have a volume reduction of 30% by week 4 Date Initiated: 10/12/2018 Target Resolution Date: 11/13/2018 Goal Status: Active Interventions: Assess ulceration(s) every visit BRANDEN, SHALLENBERGER ([id number]) Treatment Activities: Referred to DME Shaeleigh Graw for dressing supplies : 10/12/2018 Notes: Electronic Signature(s) Signed: 10/20/2018 5:12:41 PM By: Montey Hora Entered By: Montey Hora on 10/19/2018 16:40:32 Andrzejewski, [name] ([id number]) -------------------------------------------------------------------------------- Pain Assessment Details Patient Name: Paul Carpenter Date of Service: 10/19/2018 3:45 PM Medical Record Number: [id number] Patient Account Number: [id number] Date of Birth/Sex: [birth year] (62 y.o. M) Treating RN: Secundino Ginger Primary Care Esaias Cleavenger: Frazier Richards Other Clinician: Referring Eddie Payette: Frazier Richards Treating Danyell Awbrey/Extender: Melburn Hake, HOYT Weeks in Treatment: 1 Active Problems Location of Pain Severity and Description of Pain Patient Has Paino No Site Locations Pain Management and Medication Current Pain Management: Notes pt denies any pain at this time. Electronic Signature(s) Signed: 10/19/2018 5:12:49 PM By: Secundino Ginger Entered By: Secundino Ginger on 10/19/2018 15:55:14 Paul Carpenter ([id number]) -------------------------------------------------------------------------------- Patient/Caregiver Education Details Patient Name: Paul Carpenter Date of Service: 10/19/2018 3:45 PM Medical Record Number: [id number] Patient Account Number: [id number] Date of Birth/Gender: November 09, 1955 (62 y.o. M) Treating RN: Montey Hora Primary Care Physician: Frazier Richards Other Clinician: Referring Physician: Frazier Richards Treating Physician/Extender:  Melburn Hake, HOYT Weeks in Treatment: 1 Education Assessment Education Provided To: Patient and Caregiver Education Topics Provided Wound/Skin Impairment: Handouts: Other: wound care as ordered Methods: Demonstration, Explain/Verbal Responses: State content correctly Electronic Signature(s) Signed: 10/20/2018 5:12:41 PM By: Montey Hora Entered By: Montey Hora on 10/20/2018 08:08:34 Paul Carpenter ([id number]) -------------------------------------------------------------------------------- Wound Assessment Details Patient Name: Paul Carpenter Date of Service: 10/19/2018 3:45 PM Medical Record Number: [id number] Patient Account Number: [id number] Date of Birth/Sex: February 24, 1956 (62 y.o. M) Treating RN: Secundino Ginger Primary Care Mitsuo Budnick: Frazier Richards Other Clinician: Referring Perian Tedder: Frazier Richards Treating Jakari Sada/Extender: Melburn Hake, HOYT Weeks in Treatment: 1 Wound Status Wound Number: 1 Primary Skin Tear Etiology: Wound Location: Left Upper Arm Wound Open Wounding Event: Trauma Status: Date Acquired: 10/06/2018 Comorbid Anemia, Chronic Obstructive Pulmonary Disease Weeks Of Treatment: 1 History: (COPD), Hypertension, Neuropathy, Received Clustered Wound: No Chemotherapy, Received Radiation Photos Photo Uploaded By: Secundino Ginger on 10/19/2018 16:55:54 Wound Measurements Length: (cm) 0.4 Width: (cm) 0.4 Depth: (cm) 0.1 Area: (cm) 0.126 Volume: (cm) 0.013 % Reduction in Area: 99.3% % Reduction in Volume: 99.3% Epithelialization: None Tunneling: No Undermining: No Wound Description Full Thickness Without Exposed Support Foul Odo Classification: Structures Slough/F Wound Margin: Flat  and Intact Exudate Small Amount: Exudate Type: Sanguinous Exudate Color: red r After Cleansing: No ibrino No Wound Bed Granulation Amount: None Present (0%) Exposed Structure Necrotic Amount: None Present (0%) Fascia Exposed: No Fat Layer (Subcutaneous  Tissue) Exposed: Yes Tendon Exposed: No Muscle Exposed: No Joint Exposed: No Bone Exposed: No Hope, Rjay N. ([id number]) Periwound Skin Texture Texture Color No Abnormalities Noted: No No Abnormalities Noted: No Callus: No Atrophie Blanche: No Crepitus: No Cyanosis: No Excoriation: No Ecchymosis: No Induration: No Erythema: No Rash: No Hemosiderin Staining: No Scarring: No Mottled: No Pallor: No Moisture Rubor: No No Abnormalities Noted: No Dry / Scaly: No Temperature / Pain Maceration: No Temperature: No Abnormality Tenderness on Palpation: Yes Wound Preparation Ulcer Cleansing: Rinsed/Irrigated with Saline Topical Anesthetic Applied: Other: lidocaine 4%, Electronic Signature(s) Signed: 10/19/2018 5:12:49 PM By: Secundino Ginger Entered By: Secundino Ginger on 10/19/2018 15:59:50 Kass, [name] ([id number]) -------------------------------------------------------------------------------- Vitals Details Patient Name: Paul Carpenter Date of Service: 10/19/2018 3:45 PM Medical Record Number: [id number] Patient Account Number: [id number] Date of Birth/Sex: [birth year] (62 y.o. M) Treating RN: Secundino Ginger Primary Care Kindel Rochefort: Frazier Richards Other Clinician: Referring Emillio Ngo: Frazier Richards Treating Reagen Goates/Extender: Melburn Hake, HOYT Weeks in Treatment: 1 Vital Signs Time Taken: 15:50 Temperature (F): 97.8 Height (in): 72 Pulse (bpm): 84 Weight (lbs): 206 Respiratory Rate (breaths/min): 16 Body Mass Index (BMI): 27.9 Blood Pressure (mmHg): 136/75 Reference Range: 80 - 120 mg / dl Electronic Signature(s) Signed: 10/19/2018 5:12:49 PM By: Secundino Ginger Entered By: Secundino Ginger on 10/19/2018 15:55:40

## 2018-10-22 NOTE — Progress Notes (Signed)
Paul Carpenter, Paul Carpenter ([id number]) Visit Report for 10/19/2018 Chief Complaint Document Details Patient Name: Paul, Carpenter Date of Service: 10/19/2018 3:45 PM Medical Record Number: [id number] Patient Account Number: [id number] Date of Birth/Sex: 1955/12/05 (62 y.o. M) Treating RN: Cornell Barman Primary Care Provider: Frazier Richards Other Clinician: Referring Provider: Frazier Richards Treating Provider/Extender: Melburn Hake, HOYT Weeks in Treatment: 1 Information Obtained from: Patient Chief Complaint Left upper arm skin tear Electronic Signature(s) Signed: 10/19/2018 5:48:37 PM By: [name] PA-C Entered By: [name] on 10/19/2018 15:40:24 Burggraf, [name] ([id number]) -------------------------------------------------------------------------------- HPI Details Patient Name: Paul Carpenter Date of Service: 10/19/2018 3:45 PM Medical Record Number: [id number] Patient Account Number: [id number] Date of Birth/Sex: 19-Aug-1956 (62 y.o. M) Treating RN: Cornell Barman Primary Care Provider: Frazier Richards Other Clinician: Referring Provider: Frazier Richards Treating Provider/Extender: Melburn Hake, HOYT Weeks in Treatment: 1 History of Present Illness HPI Description: 10/12/18 on evaluation today patient presents for initial evaluation and clinic concerning an issue that he's been having for the past week with his left upper arm where he sustained a skin care. He knows that he did not want to go to an urgent care or the [facility] department due to the fact that he was afraid of contracting something infectious he is a current cancer patient is under the care of the cancer center. With that being said his wife was able to contact our office and fortunately we were able to work the patient in today for evaluation. He does have a history of non-small cell lung cancer on the right. He also has COPD as well as hypertension among other things. Fortunately though he is having  discomfort at the site of the injury this does not appear to be too deep or too significant which is good news. I do believe it's something that can heal quite nicely with the appropriate care. He is currently on an antibiotic, Augmentin, for a sinus infection. 10/19/18 on evaluation today patient's ulcer on the posterior portion of his left upper arm appears to be doing excellent. There does not appear to be any evidence of infection he has a lot of good news skin growth. Overall this seems to be doing great. Electronic Signature(s) Signed: 10/19/2018 5:48:37 PM By: [name] PA-C Entered By: [name] on 10/19/2018 17:03:12 [name], [name] ([id number]) -------------------------------------------------------------------------------- Physical Exam Details Patient Name: Paul Carpenter Date of Service: 10/19/2018 3:45 PM Medical Record Number: [id number] Patient Account Number: [id number] Date of Birth/Sex: 1956/01/25 (62 y.o. M) Treating RN: Cornell Barman Primary Care Provider: Frazier Richards Other Clinician: Referring Provider: Frazier Richards Treating Provider/Extender: STONE III, HOYT Weeks in Treatment: 1 Constitutional Well-nourished and well-hydrated in no acute distress. Respiratory normal breathing without difficulty. clear to auscultation bilaterally. Cardiovascular regular rate and rhythm with normal S1, S2. Psychiatric this patient is able to make decisions and demonstrates good insight into disease process. Alert and Oriented x 3. pleasant and cooperative. Notes Patient's wound bed shows no evidence of infection again and I'm very pleased with how things seem to be healing. My suggestion at this point is gonna be that we continue with the Current wound care measures since he is tolerating the dressing so well. Electronic Signature(s) Signed: 10/19/2018 5:48:37 PM By: [name] PA-C Entered By: [name] on 10/19/2018 17:03:41 [name],  [name] ([id number]) -------------------------------------------------------------------------------- Physician Orders Details Patient Name: Paul Carpenter Date of Service: 10/19/2018 3:45 PM Medical Record Number: [id number] Patient Account Number: [id number] Date of  Birth/Sex: 08-18-1956 (62 y.o. M) Treating RN: Montey Hora Primary Care Provider: Frazier Richards Other Clinician: Referring Provider: Frazier Richards Treating Provider/Extender: Melburn Hake, HOYT Weeks in Treatment: 1 Verbal / Phone Orders: No Diagnosis Coding ICD-10 Coding Code Description S41.102A Unspecified open wound of left upper arm, initial encounter I10 Essential (primary) hypertension C34.90 Malignant neoplasm of unspecified part of unspecified bronchus or lung Wound Cleansing Wound #1 Left Upper Arm o Clean wound with Normal Saline. o May Shower, gently pat wound dry prior to applying new dressing. Anesthetic (add to Medication List) Wound #1 Left Upper Arm o Topical Lidocaine 4% cream applied to wound bed prior to debridement (In Clinic Only). Primary Wound Dressing Wound #1 Left Upper Arm o Xeroform Secondary Dressing o [place] Dressing Change Frequency Wound #1 Left Upper Arm o Change dressing every day. Follow-up Appointments Wound #1 Left Upper Arm o Return Appointment in 2 weeks. Electronic Signature(s) Signed: 10/19/2018 5:48:37 PM By: [name] PA-C Signed: 10/20/2018 5:12:41 PM By: Montey Hora Entered By: Montey Hora on 10/19/2018 16:42:30 [name], [name] ([id number]) -------------------------------------------------------------------------------- Problem List Details Patient Name: Paul Carpenter Date of Service: 10/19/2018 3:45 PM Medical Record Number: [id number] Patient Account Number: [id number] Date of Birth/Sex: 1955/11/24 (62 y.o. M) Treating RN: Cornell Barman Primary Care Provider: Frazier Richards Other Clinician: Referring  Provider: Frazier Richards Treating Provider/Extender: Melburn Hake, HOYT Weeks in Treatment: 1 Active Problems ICD-10 Evaluated Encounter Code Description Active Date Today Diagnosis S41.102A Unspecified open wound of left upper arm, initial encounter 10/13/2018 No Yes I10 Essential (primary) hypertension 10/13/2018 No Yes C34.90 Malignant neoplasm of unspecified part of unspecified 10/13/2018 No Yes bronchus or lung Inactive Problems Resolved Problems Electronic Signature(s) Signed: 10/19/2018 5:48:37 PM By: [name] PA-C Entered By: [name] on 10/19/2018 15:40:19 Galluzzo, [name] ([id number]) -------------------------------------------------------------------------------- Progress Note Details Patient Name: Paul Carpenter Date of Service: 10/19/2018 3:45 PM Medical Record Number: [id number] Patient Account Number: [id number] Date of Birth/Sex: 07-29-1956 (62 y.o. M) Treating RN: Cornell Barman Primary Care Provider: Frazier Richards Other Clinician: Referring Provider: Frazier Richards Treating Provider/Extender: Melburn Hake, HOYT Weeks in Treatment: 1 Subjective Chief Complaint Information obtained from Patient Left upper arm skin tear History of Present Illness (HPI) 10/12/18 on evaluation today patient presents for initial evaluation and clinic concerning an issue that he's been having for the past week with his left upper arm where he sustained a skin care. He knows that he did not want to go to an urgent care or the [facility] department due to the fact that he was afraid of contracting something infectious he is a current cancer patient is under the care of the cancer center. With that being said his wife was able to contact our office and fortunately we were able to work the patient in today for evaluation. He does have a history of non-small cell lung cancer on the right. He also has COPD as well as hypertension among other things. Fortunately though he is  having discomfort at the site of the injury this does not appear to be too deep or too significant which is good news. I do believe it's something that can heal quite nicely with the appropriate care. He is currently on an antibiotic, Augmentin, for a sinus infection. 10/19/18 on evaluation today patient's ulcer on the posterior portion of his left upper arm appears to be doing excellent. There does not appear to be any evidence of infection he has a lot of good news skin growth.  Overall this seems to be doing great. Patient History Information obtained from Patient. Family History Cancer - Child,Father, Diabetes - Paternal Grandparents, Heart Disease - Father, Hypertension - Father, Lung Disease - Father, Stroke - Father, No family history of Hereditary Spherocytosis, Kidney Disease, Seizures, Thyroid Problems, Tuberculosis. Social History Current every day smoker, Marital Status - Married, Alcohol Use - Moderate, Drug Use - No History, Caffeine Use - Daily. Medical And Surgical History Notes Respiratory stage 3 lung cancer Cardiovascular irregular heartbeat Review of Systems (ROS) Constitutional Symptoms (General Health) Denies complaints or symptoms of Fever, Chills. Respiratory The patient has no complaints or symptoms. Cardiovascular The patient has no complaints or symptoms. Psychiatric The patient has no complaints or symptoms. Paul Carpenter, Paul Carpenter ([id number]) Objective Constitutional Well-nourished and well-hydrated in no acute distress. Vitals Time Taken: 3:50 PM, Height: 72 in, Weight: 206 lbs, BMI: 27.9, Temperature: 97.8 F, Pulse: 84 bpm, Respiratory Rate: 16 breaths/min, Blood Pressure: 136/75 mmHg. Respiratory normal breathing without difficulty. clear to auscultation bilaterally. Cardiovascular regular rate and rhythm with normal S1, S2. Psychiatric this patient is able to make decisions and demonstrates good insight into disease process. Alert and Oriented x 3.  pleasant and cooperative. General Notes: Patient's wound bed shows no evidence of infection again and I'm very pleased with how things seem to be healing. My suggestion at this point is gonna be that we continue with the Current wound care measures since he is tolerating the dressing so well. Integumentary (Hair, Skin) Wound #1 status is Open. Original cause of wound was Trauma. The wound is located on the Left Upper Arm. The wound measures 0.4cm length x 0.4cm width x 0.1cm depth; 0.126cm^2 area and 0.013cm^3 volume. There is Fat Layer (Subcutaneous Tissue) Exposed exposed. There is no tunneling or undermining noted. There is a small amount of sanguinous drainage noted. The wound margin is flat and intact. There is no granulation within the wound bed. There is no necrotic tissue within the wound bed. The periwound skin appearance did not exhibit: Callus, Crepitus, Excoriation, Induration, Rash, Scarring, Dry/Scaly, Maceration, Atrophie Blanche, Cyanosis, Ecchymosis, Hemosiderin Staining, Mottled, Pallor, Rubor, Erythema. Periwound temperature was noted as No Abnormality. The periwound has tenderness on palpation. Assessment Active Problems ICD-10 Unspecified open wound of left upper arm, initial encounter Essential (primary) hypertension Malignant neoplasm of unspecified part of unspecified bronchus or lung Plan Paul Carpenter, Paul N. ([id number]) Wound Cleansing: Wound #1 Left Upper Arm: Clean wound with Normal Saline. May Shower, gently pat wound dry prior to applying new dressing. Anesthetic (add to Medication List): Wound #1 Left Upper Arm: Topical Lidocaine 4% cream applied to wound bed prior to debridement (In Clinic Only). Primary Wound Dressing: Wound #1 Left Upper Arm: Xeroform Secondary Dressing: [place] Dressing Change Frequency: Wound #1 Left Upper Arm: Change dressing every day. Follow-up Appointments: Wound #1 Left Upper Arm: Return Appointment in 2 weeks. I'm  gonna recommend currently that we continue with the above wound care measures. The patient in agreement the plan. We will see him back for reevaluation in two weeks time. Please see above for specific wound care orders. We will see patient for re-evaluation in 2 week(s) here in the clinic. If anything worsens or changes patient will contact our office for additional recommendations. Electronic Signature(s) Signed: 10/19/2018 5:48:37 PM By: [name] PA-C Entered By: [name] on 10/19/2018 17:03:56 Deeb, [name] ([id number]) -------------------------------------------------------------------------------- ROS/PFSH Details Patient Name: Paul Carpenter Date of Service: 10/19/2018 3:45 PM Medical Record Number: [id number] Patient Account  Number: [id number] Date of Birth/Sex: Jul 25, 1956 (62 y.o. M) Treating RN: Cornell Barman Primary Care Provider: Frazier Richards Other Clinician: Referring Provider: Frazier Richards Treating Provider/Extender: Melburn Hake, HOYT Weeks in Treatment: 1 Information Obtained From Patient Wound History Do you currently have one or more open woundso Yes How many open wounds do you currently haveo 1 Approximately how long have you had your woundso 1 week How have you been treating your wound(s) until nowo vaseline and gauze Has your wound(s) ever healed and then re-openedo No Have you had any lab work done in the past montho Yes Who ordered the lab work doneo cancer center Have you tested positive for an antibiotic resistant organism (MRSA, VRE)o No Have you tested positive for osteomyelitis (bone infection)o No Have you had any tests for circulation on your legso No Constitutional Symptoms (General Health) Complaints and Symptoms: Negative for: Fever; Chills Eyes Medical History: Negative for: Cataracts; Glaucoma; Optic Neuritis Ear/Nose/Mouth/Throat Medical History: Negative for: Chronic sinus problems/congestion; Middle ear  problems Hematologic/Lymphatic Medical History: Positive for: Anemia Negative for: Hemophilia; Human Immunodeficiency Virus; Lymphedema; Sickle Cell Disease Respiratory Complaints and Symptoms: No Complaints or Symptoms Medical History: Positive for: Chronic Obstructive Pulmonary Disease (COPD) Negative for: Aspiration; Asthma; Pneumothorax; Sleep Apnea; Tuberculosis Past Medical History Notes: stage 3 lung cancer Cardiovascular Paul Carpenter, ANGLIN. ([id number]) Complaints and Symptoms: No Complaints or Symptoms Medical History: Positive for: Hypertension Negative for: Angina; Arrhythmia; Congestive Heart Failure; Coronary Artery Disease; Deep Vein Thrombosis; Hypotension; Myocardial Infarction; Peripheral Arterial Disease; Peripheral Venous Disease; Phlebitis; Vasculitis Past Medical History Notes: irregular heartbeat Gastrointestinal Medical History: Negative for: Cirrhosis ; Colitis; Crohnos; Hepatitis A; Hepatitis B; Hepatitis C Endocrine Medical History: Negative for: Type I Diabetes; Type II Diabetes Genitourinary Medical History: Negative for: End Stage Renal Disease Immunological Medical History: Negative for: Lupus Erythematosus; Raynaudos; Scleroderma Integumentary (Skin) Medical History: Negative for: History of Burn; History of pressure wounds Musculoskeletal Medical History: Negative for: Gout; Rheumatoid Arthritis; Osteoarthritis; Osteomyelitis Neurologic Medical History: Positive for: Neuropathy Negative for: Dementia; Quadriplegia; Paraplegia; Seizure Disorder Oncologic Medical History: Positive for: Received Chemotherapy; Received Radiation - 37 treatments Psychiatric Complaints and Symptoms: No Complaints or Symptoms Medical History: Negative for: Anorexia/bulimia; Confinement Anxiety Paul Carpenter, Paul N. ([id number]) Immunizations Pneumococcal Vaccine: Received Pneumococcal Vaccination: Yes Implantable Devices Family and Social History Cancer:  Yes - Child,Father; Diabetes: Yes - Paternal Grandparents; Heart Disease: Yes - Father; Hereditary Spherocytosis: No; Hypertension: Yes - Father; Kidney Disease: No; Lung Disease: Yes - Father; Seizures: No; Stroke: Yes - Father; Thyroid Problems: No; Tuberculosis: No; Current every day smoker; Marital Status - Married; Alcohol Use: Moderate; Drug Use: No History; Caffeine Use: Daily; Financial Concerns: No; Food, Clothing or Shelter Needs: No; Support System Lacking: No; Transportation Concerns: No; Advanced Directives: No; Patient does not want information on Advanced Directives Physician Affirmation I have reviewed and agree with the above information. Electronic Signature(s) Signed: 10/19/2018 5:10:33 PM By: Gretta Cool, BSN, RN, CWS, Kim RN, BSN Signed: 10/19/2018 5:48:37 PM By: [name] PA-C Entered By: [name] on 10/19/2018 17:03:25 Paul Carpenter, Paul Carpenter ([id number]) -------------------------------------------------------------------------------- SuperBill Details Patient Name: Paul Carpenter Date of Service: 10/19/2018 Medical Record Number: [id number] Patient Account Number: [id number] Date of Birth/Sex: 1956-05-21 (62 y.o. M) Treating RN: Cornell Barman Primary Care Provider: Frazier Richards Other Clinician: Referring Provider: Frazier Richards Treating Provider/Extender: Melburn Hake, HOYT Weeks in Treatment: 1 Diagnosis Coding ICD-10 Codes Code Description S41.102A Unspecified open wound of left upper arm, initial encounter I10 Essential (primary) hypertension C34.90 Malignant neoplasm of  unspecified part of unspecified bronchus or lung Facility Procedures CPT4 Code: [id number] Description: [phone] - WOUND CARE VISIT-LEV 2 EST PT Modifier: Quantity: 1 Physician Procedures CPT4 Code: [id number] Description: 17001 - WC PHYS LEVEL 4 - EST PT ICD-10 Diagnosis Description V49.449Q Unspecified open wound of left upper arm, initial encounte I10 Essential (primary) hypertension  C34.90 Malignant neoplasm of unspecified part of unspecified bron Modifier: r chus or lung Quantity: 1 Electronic Signature(s) Signed: 10/21/2018 12:35:32 AM By: [name] PA-C Previous Signature: 10/20/2018 2:42:46 PM Version By: Gretta Cool, BSN, RN, CWS, Kim RN, BSN Entered By: [name] on 10/20/2018 14:43:23

## 2018-10-31 ENCOUNTER — Ambulatory Visit: Payer: BLUE CROSS/BLUE SHIELD

## 2018-11-02 ENCOUNTER — Inpatient Hospital Stay: Payer: BLUE CROSS/BLUE SHIELD | Attending: Oncology

## 2018-11-02 VITALS — BP 136/79 | HR 100 | Temp 98.0°F | Resp 18 | Wt 207.0 lb

## 2018-11-02 DIAGNOSIS — C3491 Malignant neoplasm of unspecified part of right bronchus or lung: Secondary | ICD-10-CM

## 2018-11-02 DIAGNOSIS — Z8042 Family history of malignant neoplasm of prostate: Secondary | ICD-10-CM | POA: Diagnosis not present

## 2018-11-02 DIAGNOSIS — Z5112 Encounter for antineoplastic immunotherapy: Secondary | ICD-10-CM | POA: Insufficient documentation

## 2018-11-02 DIAGNOSIS — Z79899 Other long term (current) drug therapy: Secondary | ICD-10-CM | POA: Insufficient documentation

## 2018-11-02 DIAGNOSIS — E871 Hypo-osmolality and hyponatremia: Secondary | ICD-10-CM | POA: Diagnosis not present

## 2018-11-02 DIAGNOSIS — Z923 Personal history of irradiation: Secondary | ICD-10-CM | POA: Insufficient documentation

## 2018-11-02 DIAGNOSIS — K219 Gastro-esophageal reflux disease without esophagitis: Secondary | ICD-10-CM | POA: Insufficient documentation

## 2018-11-02 DIAGNOSIS — C342 Malignant neoplasm of middle lobe, bronchus or lung: Secondary | ICD-10-CM | POA: Insufficient documentation

## 2018-11-02 DIAGNOSIS — R0989 Other specified symptoms and signs involving the circulatory and respiratory systems: Secondary | ICD-10-CM | POA: Diagnosis not present

## 2018-11-02 DIAGNOSIS — F17210 Nicotine dependence, cigarettes, uncomplicated: Secondary | ICD-10-CM | POA: Insufficient documentation

## 2018-11-02 DIAGNOSIS — I10 Essential (primary) hypertension: Secondary | ICD-10-CM | POA: Diagnosis not present

## 2018-11-02 DIAGNOSIS — Z86010 Personal history of colonic polyps: Secondary | ICD-10-CM | POA: Insufficient documentation

## 2018-11-02 DIAGNOSIS — I2510 Atherosclerotic heart disease of native coronary artery without angina pectoris: Secondary | ICD-10-CM | POA: Insufficient documentation

## 2018-11-02 DIAGNOSIS — F419 Anxiety disorder, unspecified: Secondary | ICD-10-CM | POA: Diagnosis not present

## 2018-11-02 DIAGNOSIS — J449 Chronic obstructive pulmonary disease, unspecified: Secondary | ICD-10-CM | POA: Insufficient documentation

## 2018-11-02 DIAGNOSIS — I7300 Raynaud's syndrome without gangrene: Secondary | ICD-10-CM | POA: Insufficient documentation

## 2018-11-02 MED ORDER — DEXAMETHASONE SODIUM PHOSPHATE 10 MG/ML IJ SOLN
10.0000 mg | Freq: Once | INTRAMUSCULAR | Status: AC
  Administered 2018-11-02: 10 mg via INTRAVENOUS
  Filled 2018-11-02: qty 1

## 2018-11-02 MED ORDER — SODIUM CHLORIDE 0.9 % IV SOLN
1000.0000 mg | Freq: Once | INTRAVENOUS | Status: AC
  Administered 2018-11-02: 1000 mg via INTRAVENOUS
  Filled 2018-11-02: qty 20

## 2018-11-02 MED ORDER — DIPHENHYDRAMINE HCL 50 MG/ML IJ SOLN
25.0000 mg | Freq: Once | INTRAMUSCULAR | Status: AC
  Administered 2018-11-02: 25 mg via INTRAVENOUS
  Filled 2018-11-02: qty 1

## 2018-11-02 MED ORDER — SODIUM CHLORIDE 0.9 % IV SOLN
Freq: Once | INTRAVENOUS | Status: AC
  Administered 2018-11-02: 14:00:00 via INTRAVENOUS
  Filled 2018-11-02: qty 250

## 2018-11-02 MED ORDER — HEPARIN SOD (PORK) LOCK FLUSH 100 UNIT/ML IV SOLN
500.0000 [IU] | Freq: Once | INTRAVENOUS | Status: AC | PRN
  Administered 2018-11-02: 500 [IU]
  Filled 2018-11-02: qty 5

## 2018-11-02 MED ORDER — SODIUM CHLORIDE 0.9% FLUSH
10.0000 mL | INTRAVENOUS | Status: DC | PRN
  Administered 2018-11-02: 10 mL
  Filled 2018-11-02: qty 10

## 2018-11-02 NOTE — Progress Notes (Signed)
Per Tillie Rung, Dr. Grayland Ormond states labs ok from 12/18 for treatment today.

## 2018-11-03 ENCOUNTER — Encounter: Payer: BLUE CROSS/BLUE SHIELD | Attending: Physician Assistant | Admitting: Physician Assistant

## 2018-11-03 DIAGNOSIS — F17200 Nicotine dependence, unspecified, uncomplicated: Secondary | ICD-10-CM | POA: Diagnosis not present

## 2018-11-03 DIAGNOSIS — S41112A Laceration without foreign body of left upper arm, initial encounter: Secondary | ICD-10-CM | POA: Diagnosis present

## 2018-11-03 DIAGNOSIS — C3490 Malignant neoplasm of unspecified part of unspecified bronchus or lung: Secondary | ICD-10-CM | POA: Diagnosis not present

## 2018-11-03 DIAGNOSIS — J449 Chronic obstructive pulmonary disease, unspecified: Secondary | ICD-10-CM | POA: Insufficient documentation

## 2018-11-03 DIAGNOSIS — I110 Hypertensive heart disease with heart failure: Secondary | ICD-10-CM | POA: Insufficient documentation

## 2018-11-03 DIAGNOSIS — X58XXXA Exposure to other specified factors, initial encounter: Secondary | ICD-10-CM | POA: Insufficient documentation

## 2018-11-03 DIAGNOSIS — G629 Polyneuropathy, unspecified: Secondary | ICD-10-CM | POA: Insufficient documentation

## 2018-11-10 NOTE — Progress Notes (Signed)
ZOHAIR, EPP ([id number]) Visit Report for 11/03/2018 Arrival Information Details Patient Name: MONTERRIO, GERST Date of Service: 11/03/2018 3:45 PM Medical Record Number: [id number] Patient Account Number: [id number] Date of Birth/Sex: 1955-11-21 (63 y.o. M) Treating RN: Montey Hora Primary Care Abhinav Mayorquin: Frazier Richards Other Clinician: Referring Neeya Prigmore: Frazier Richards Treating Maleea Camilo/Extender: Melburn Hake, HOYT Weeks in Treatment: 3 Visit Information History Since Last Visit Added or deleted any medications: No Patient Arrived: Ambulatory Any new allergies or adverse reactions: No Arrival Time: 15:55 Had a fall or experienced change in No Accompanied By: self activities of daily living that may affect Transfer Assistance: None risk of falls: Patient Identification Verified: Yes Signs or symptoms of abuse/neglect since last visito No Secondary Verification Process Completed: Yes Hospitalized since last visit: No Implantable device outside of the clinic excluding No cellular tissue based products placed in the center since last visit: Has Dressing in Place as Prescribed: No Pain Present Now: No Electronic Signature(s) Signed: 11/03/2018 4:39:33 PM By: Lorine Bears RCP, RRT, CHT Entered By: Lorine Bears on 11/03/2018 15:55:36 Zent, [name] ([id number]) -------------------------------------------------------------------------------- Clinic Level of Care Assessment Details Patient Name: [name] Date of Service: 11/03/2018 3:45 PM Medical Record Number: [id number] Patient Account Number: [id number] Date of Birth/Sex: Dec 07, 1955 (63 y.o. M) Treating RN: Harold Barban Primary Care Ruble Pumphrey: Frazier Richards Other Clinician: Referring Robynn Marcel: Frazier Richards Treating Norval Slaven/Extender: Melburn Hake, HOYT Weeks in Treatment: 3 Clinic Level of Care Assessment Items TOOL 4 Quantity Score []  - Use when only an EandM is  performed on FOLLOW-UP visit 0 ASSESSMENTS - Nursing Assessment / Reassessment X - Reassessment of Co-morbidities (includes updates in patient status) 1 10 X- 1 5 Reassessment of Adherence to Treatment Plan ASSESSMENTS - Wound and Skin Assessment / Reassessment X - Simple Wound Assessment / Reassessment - one wound 1 5 []  - 0 Complex Wound Assessment / Reassessment - multiple wounds []  - 0 Dermatologic / Skin Assessment (not related to wound area) ASSESSMENTS - Focused Assessment []  - Circumferential Edema Measurements - multi extremities 0 []  - 0 Nutritional Assessment / Counseling / Intervention []  - 0 Lower Extremity Assessment (monofilament, tuning fork, pulses) []  - 0 Peripheral Arterial Disease Assessment (using hand held doppler) ASSESSMENTS - Ostomy and/or Continence Assessment and Care []  - Incontinence Assessment and Management 0 []  - 0 Ostomy Care Assessment and Management (repouching, etc.) PROCESS - Coordination of Care X - Simple Patient / Family Education for ongoing care 1 15 []  - 0 Complex (extensive) Patient / Family Education for ongoing care X- 1 10 Staff obtains Programmer, systems, Records, Test Results / Process Orders []  - 0 Staff telephones HHA, Nursing Homes / Clarify orders / etc []  - 0 Routine Transfer to another Facility (non-emergent condition) []  - 0 Routine Hospital Admission (non-emergent condition) []  - 0 New Admissions / Biomedical engineer / Ordering NPWT, Apligraf, etc. []  - 0 Emergency Hospital Admission (emergent condition) X- 1 10 Simple Discharge Coordination JOVANN, LUSE. ([id number]) []  - 0 Complex (extensive) Discharge Coordination PROCESS - Special Needs []  - Pediatric / Minor Patient Management 0 []  - 0 Isolation Patient Management []  - 0 Hearing / Language / Visual special needs []  - 0 Assessment of Community assistance (transportation, D/C planning, etc.) []  - 0 Additional assistance / Altered mentation []  -  0 Support Surface(s) Assessment (bed, cushion, seat, etc.) INTERVENTIONS - Wound Cleansing / Measurement X - Simple Wound Cleansing - one wound 1 5 []  - 0 Complex Wound Cleansing - multiple wounds  X- 1 5 Wound Imaging (photographs - any number of wounds) []  - 0 Wound Tracing (instead of photographs) X- 1 5 Simple Wound Measurement - one wound []  - 0 Complex Wound Measurement - multiple wounds INTERVENTIONS - Wound Dressings []  - Small Wound Dressing one or multiple wounds 0 []  - 0 Medium Wound Dressing one or multiple wounds []  - 0 Large Wound Dressing one or multiple wounds []  - 0 Application of Medications - topical []  - 0 Application of Medications - injection INTERVENTIONS - Miscellaneous []  - External ear exam 0 []  - 0 Specimen Collection (cultures, biopsies, blood, body fluids, etc.) []  - 0 Specimen(s) / Culture(s) sent or taken to Lab for analysis []  - 0 Patient Transfer (multiple staff / Civil Service fast streamer / Similar devices) []  - 0 Simple Staple / Suture removal (25 or less) []  - 0 Complex Staple / Suture removal (26 or more) []  - 0 Hypo / Hyperglycemic Management (close monitor of Blood Glucose) []  - 0 Ankle / Brachial Index (ABI) - do not check if billed separately X- 1 5 Vital Signs Portela, Alban N. ([id number]) Has the patient been seen at the hospital within the last three years: Yes Total Score: 75 Level Of Care: New/Established - Level 2 Electronic Signature(s) Signed: 11/09/2018 2:41:13 PM By: Harold Barban Entered By: Harold Barban on 11/03/2018 16:31:43 Kapfer, [name] ([id number]) -------------------------------------------------------------------------------- Lower Extremity Assessment Details Patient Name: [name] Date of Service: 11/03/2018 3:45 PM Medical Record Number: [id number] Patient Account Number: [id number] Date of Birth/Sex: 23-Feb-1956 (63 y.o. M) Treating RN: Secundino Ginger Primary Care Masyn Fullam: Frazier Richards Other  Clinician: Referring Margarethe Virgen: Frazier Richards Treating Nassim Cosma/Extender: Melburn Hake, HOYT Weeks in Treatment: 3 Electronic Signature(s) Signed: 11/03/2018 4:25:44 PM By: Secundino Ginger Entered By: Secundino Ginger on 11/03/2018 16:00:12 Redner, [name] ([id number]) -------------------------------------------------------------------------------- Multi Wound Chart Details Patient Name: [name] Date of Service: 11/03/2018 3:45 PM Medical Record Number: [id number] Patient Account Number: [id number] Date of Birth/Sex: 1956/07/31 (63 y.o. M) Treating RN: Harold Barban Primary Care Katisha Shimizu: Frazier Richards Other Clinician: Referring Nikia Mangino: Frazier Richards Treating Alejandria Wessells/Extender: Melburn Hake, HOYT Weeks in Treatment: 3 Vital Signs Height(in): 72 Pulse(bpm): 97 Weight(lbs): 206 Blood Pressure(mmHg): 128/84 Body Mass Index(BMI): 28 Temperature(F): 98.4 Respiratory Rate 16 (breaths/min): Photos: [N/A:N/A] Wound Location: Left Upper Arm N/A N/A Wounding Event: Trauma N/A N/A Primary Etiology: Skin Tear N/A N/A Date Acquired: 10/06/2018 N/A N/A Weeks of Treatment: 3 N/A N/A Wound Status: Healed - Epithelialized N/A N/A Measurements L x W x D 0x0x0 N/A N/A (cm) Area (cm) : 0 N/A N/A Volume (cm) : 0 N/A N/A % Reduction in Area: 100.00% N/A N/A % Reduction in Volume: 100.00% N/A N/A Classification: Full Thickness Without N/A N/A Exposed Support Structures Periwound Skin Texture: No Abnormalities Noted N/A N/A Periwound Skin Moisture: No Abnormalities Noted N/A N/A Periwound Skin Color: No Abnormalities Noted N/A N/A Tenderness on Palpation: No N/A N/A Treatment Notes Electronic Signature(s) Signed: 11/09/2018 2:41:13 PM By: Harold Barban Entered By: Harold Barban on 11/03/2018 16:30:09 [name] ([id number]) -------------------------------------------------------------------------------- [place] Details Patient Name: [name] Date of Service: 11/03/2018 3:45 PM Medical Record Number: [id number] Patient Account Number: [id number] Date of Birth/Sex: May 30, 1956 (63 y.o. M) Treating RN: Harold Barban Primary Care Kameshia Madruga: Frazier Richards Other Clinician: Referring Akari Defelice: Frazier Richards Treating Allee Busk/Extender: Melburn Hake, HOYT Weeks in Treatment: 3 Active Inactive Electronic Signature(s) Signed: 11/09/2018 2:41:13 PM By: Harold Barban Entered By: Harold Barban on 11/03/2018 16:52:26 Gettel, Nakul Delane Ginger ([id number]) --------------------------------------------------------------------------------  Pain Assessment Details Patient Name: YIGIT, NORKUS Date of Service: 11/03/2018 3:45 PM Medical Record Number: [id number] Patient Account Number: [id number] Date of Birth/Sex: 1955-12-01 (63 y.o. M) Treating RN: Montey Hora Primary Care Keon Pender: Frazier Richards Other Clinician: Referring Estevan Kersh: Frazier Richards Treating Darianna Amy/Extender: Melburn Hake, HOYT Weeks in Treatment: 3 Active Problems Location of Pain Severity and Description of Pain Patient Has Paino No Site Locations Pain Management and Medication Current Pain Management: Electronic Signature(s) Signed: 11/03/2018 4:39:33 PM By: Paulla Fore, RRT, CHT Signed: 11/03/2018 5:14:43 PM By: Montey Hora Entered By: Lorine Bears on 11/03/2018 15:55:43 [name] ([id number]) -------------------------------------------------------------------------------- Patient/Caregiver Education Details Patient Name: [name] Date of Service: 11/03/2018 3:45 PM Medical Record Number: [id number] Patient Account Number: [id number] Date of Birth/Gender: 15-May-1956 (63 y.o. M) Treating RN: Harold Barban Primary Care Physician: Frazier Richards Other Clinician: Referring Physician: Frazier Richards Treating Physician/Extender: Sharalyn Ink in Treatment: 3 Education  Assessment Education Provided To: Patient Education Topics Provided Electronic Signature(s) Signed: 11/09/2018 2:41:13 PM By: Harold Barban Entered By: Harold Barban on 11/03/2018 16:31:05 [name] ([id number]) -------------------------------------------------------------------------------- Wound Assessment Details Patient Name: [name] Date of Service: 11/03/2018 3:45 PM Medical Record Number: [id number] Patient Account Number: [id number] Date of Birth/Sex: 1956-06-13 (63 y.o. M) Treating RN: Secundino Ginger Primary Care Meighan Treto: Frazier Richards Other Clinician: Referring Mitali Shenefield: Frazier Richards Treating Emme Rosenau/Extender: Melburn Hake, HOYT Weeks in Treatment: 3 Wound Status Wound Number: 1 Primary Etiology: Skin Tear Wound Location: Left Upper Arm Wound Status: Healed - Epithelialized Wounding Event: Trauma Date Acquired: 10/06/2018 Weeks Of Treatment: 3 Clustered Wound: No Photos Photo Uploaded By: Secundino Ginger on 11/03/2018 16:09:16 Wound Measurements Length: (cm) 0 Width: (cm) 0 Depth: (cm) 0 Area: (cm) 0 Volume: (cm) 0 % Reduction in Area: 100% % Reduction in Volume: 100% Wound Description Full Thickness Without Exposed Support Classification: Structures Periwound Skin Texture Texture Color No Abnormalities Noted: No No Abnormalities Noted: No Moisture No Abnormalities Noted: No Electronic Signature(s) Signed: 11/03/2018 4:25:44 PM By: Secundino Ginger Entered By: Secundino Ginger on 11/03/2018 16:02:28 [name] ([id number]) -------------------------------------------------------------------------------- [place] Details Patient Name: [name] Date of Service: 11/03/2018 3:45 PM Medical Record Number: [id number] Patient Account Number: [id number] Date of Birth/Sex: [birth year] (63 y.o. M) Treating RN: Montey Hora Primary Care Zoriah Pulice: Frazier Richards Other Clinician: Referring Yekaterina Escutia: Frazier Richards Treating  Demarco Bacci/Extender: Melburn Hake, HOYT Weeks in Treatment: 3 Vital Signs Time Taken: 15:55 Temperature (F): 98.4 Height (in): 72 Pulse (bpm): 97 Weight (lbs): 206 Respiratory Rate (breaths/min): 16 Body Mass Index (BMI): 27.9 Blood Pressure (mmHg): 128/84 Reference Range: 80 - 120 mg / dl Electronic Signature(s) Signed: 11/03/2018 4:39:33 PM By: Lorine Bears RCP, RRT, CHT Entered By: Lorine Bears on 11/03/2018 15:58:31

## 2018-11-11 NOTE — Progress Notes (Signed)
Paul Carpenter  [phone] [phone]) [phone]  ID: Paul Carpenter OB: 11-11-1955  MR#: [id number]  FBP#:[id number]  Patient Care Team: Kirk Ruths, MD as PCP - General (Internal Medicine)  CHIEF COMPLAINT: Stage IIIa non-small cell lung cancer, right middle lobe.  INTERVAL HISTORY: Patient returns to clinic today for further evaluation and consideration of cycle 10 of maintenance durvalumab.  He has increased cough and congestion, but otherwise feels well.  He does not complain of weakness or fatigue today. He continues to be highly anxious.  He has no neurologic complaints.  He denies any fevers.  He does not complain of dysphasia today.  He has no chest pain, cough, hemoptysis, or shortness of breath. He denies any nausea, vomiting, constipation, or diarrhea. He has no urinary complaints.  Patient offers no further specific complaints today.  REVIEW OF SYSTEMS:   Review of Systems  Constitutional: Negative for fever, malaise/fatigue and weight loss.  HENT: Negative.  Negative for congestion and sore throat.   Respiratory: Positive for cough. Negative for hemoptysis and shortness of breath.   Cardiovascular: Negative.  Negative for chest pain and leg swelling.  Gastrointestinal: Negative.  Negative for abdominal pain, blood in stool and melena.  Genitourinary: Negative.  Negative for dysuria.  Musculoskeletal: Negative.  Negative for joint pain.  Skin: Negative.  Negative for rash.  Neurological: Negative.  Negative for sensory change, focal weakness, weakness and headaches.  Psychiatric/Behavioral: The patient is nervous/anxious.     As per HPI. Otherwise, a complete review of systems is negative.  PAST MEDICAL HISTORY: Past Medical History:  Diagnosis Date  . Anginal pain ([place])   . Anxiety   . Chest pain   . Chicken pox   . Complication of anesthesia    o2 dropped after neck fusion  . COPD (chronic obstructive pulmonary disease) ([place])     . Coronary artery disease   . Cough    chronic  clear phlegm  . Dysrhythmia    palpitations  . GERD (gastroesophageal reflux disease)    h/o reflux/ hoarsness  . Hematochezia   . Hemorrhoids   . History of chickenpox   . History of colon polyps   . History of Helicobacter pylori infection   . Hoarseness   . Hypertension   . Lung cancer ([place]) 05/2016   Chemo + rad tx's.   . Migraines   . OSA (obstructive sleep apnea)    has CPAP but does not use  . Personal history of tobacco use, presenting hazards to health 03/05/2016  . Pneumonia    5/17  . Raynaud disease   . Raynaud disease   . Raynaud's disease   . Rotator cuff tear    on right  . Shortness of breath dyspnea   . Sleep apnea   . Ulcer (traumatic) of oral mucosa     PAST SURGICAL HISTORY: Past Surgical History:  Procedure Laterality Date  . BACK SURGERY     cervical fusion x 2  . CARDIAC CATHETERIZATION    . CERVICAL DISCECTOMY     x 2  . COLONOSCOPY    . COLONOSCOPY N/A 07/25/2015   Procedure: COLONOSCOPY;  Surgeon: Lollie Sails, MD;  Location: [facility] ENDOSCOPY;  Service: Endoscopy;  Laterality: N/A;  . COLONOSCOPY WITH PROPOFOL N/A 10/04/2017   Procedure: COLONOSCOPY WITH PROPOFOL;  Surgeon: Lollie Sails, MD;  Location: [facility] ENDOSCOPY;  Service: Endoscopy;  Laterality: N/A;  . ELECTROMAGNETIC NAVIGATION BROCHOSCOPY Left 06/28/2016   Procedure: ELECTROMAGNETIC  NAVIGATION BRONCHOSCOPY;  Surgeon: Vilinda Boehringer, MD;  Location: [facility] ORS;  Service: Cardiopulmonary;  Laterality: Left;  . ENDOBRONCHIAL ULTRASOUND N/A 04/11/2018   Procedure: ENDOBRONCHIAL ULTRASOUND;  Surgeon: [name], MD;  Location: [facility] ORS;  Service: Cardiopulmonary;  Laterality: N/A;  . ESOPHAGOGASTRODUODENOSCOPY N/A 07/25/2015   Procedure: ESOPHAGOGASTRODUODENOSCOPY (EGD);  Surgeon: Lollie Sails, MD;  Location: [facility] ENDOSCOPY;  Service: Endoscopy;  Laterality: N/A;  . NASAL SINUS SURGERY     x 2   . PORTA CATH INSERTION N/A  04/24/2018   Procedure: PORTA CATH INSERTION;  Surgeon: Algernon Huxley, MD;  Location: [place] CV LAB;  Service: Cardiovascular;  Laterality: N/A;  . rotator cuff surgery Right    07/2016  . SEPTOPLASTY    . SKIN GRAFT      FAMILY HISTORY Family History  Problem Relation Age of Onset  . Heart disease Father   . Prostate cancer Father   . Heart disease Paternal Grandmother   . Heart attack Maternal Grandfather 52  . Kidney cancer Neg Hx   . Bladder Cancer Neg Hx   . Other Neg Hx        pituitary abnormality       ADVANCED DIRECTIVES:    HEALTH MAINTENANCE: Social History   Tobacco Use  . Smoking status: Current Every Day Smoker    Packs/day: 1.50    Years: 45.00    Pack years: 67.50    Types: Cigarettes  . Smokeless tobacco: Never Used  Substance Use Topics  . Alcohol use: Yes    Alcohol/week: 2.0 standard drinks    Types: 2 Standard drinks or equivalent per week    Comment: moderate  . Drug use: No     Allergies  Allergen Reactions  . Lisinopril Rash  . Varenicline Rash    Current Outpatient Medications  Medication Sig Dispense Refill  . acetaminophen (TYLENOL) 500 MG tablet Take 1,000 mg by mouth every 6 (six) hours as needed (for pain.).    Marland Kitchen albuterol (PROAIR HFA) 108 (90 Base) MCG/ACT inhaler Inhale 2 puffs into the lungs every 4 (four) hours as needed for wheezing or shortness of breath. 1 Inhaler 1  . atorvastatin (LIPITOR) 40 MG tablet Take 40 mg by mouth at bedtime.     . busPIRone (BUSPAR) 15 MG tablet Take 15 mg by mouth 2 (two) times daily.   10  . diazepam (VALIUM) 5 MG tablet Take 5 mg by mouth every 12 (twelve) hours as needed for anxiety (SCHEDULED EVERY NIGHT).     . fluticasone (FLONASE) 50 MCG/ACT nasal spray Place 1 spray into both nostrils daily as needed (for allergies.).     Marland Kitchen ipratropium-albuterol (DUONEB) 0.5-2.5 (3) MG/3ML SOLN Take 3 mLs by nebulization every 6 (six) hours. 360 mL 5  . lidocaine (XYLOCAINE) 2 % solution   2  .  metoprolol tartrate (LOPRESSOR) 50 MG tablet Take 50 mg by mouth 2 (two) times daily.    . mirabegron ER (MYRBETRIQ) 50 MG TB24 tablet Take 1 tablet (50 mg total) by mouth daily. 90 tablet 3  . Multiple Vitamin (MULTIVITAMIN WITH MINERALS) TABS tablet Take 1 tablet by mouth daily.     . nicotine (NICODERM CQ - DOSED IN MG/24 HOURS) 21 mg/24hr patch Place 1 patch (21 mg total) onto the skin daily. 30 patch 12  . ondansetron (ZOFRAN) 8 MG tablet Take 8 mg by mouth every 8 (eight) hours as needed for nausea or vomiting.    . OXYGEN Inhale 2  L into the lungs at bedtime.     . pantoprazole (PROTONIX) 40 MG tablet Take 40 mg by mouth daily.  3  . potassium chloride (K-DUR) 10 MEQ tablet TAKE 1 TABLET (10 MEQ TOTAL) BY MOUTH ONCE DAILY.  3  . umeclidinium-vilanterol (ANORO ELLIPTA) 62.5-25 MCG/INH AEPB Inhale 1 puff into the lungs daily. 1 each 10  . umeclidinium-vilanterol (ANORO ELLIPTA) 62.5-25 MCG/INH AEPB Inhale 1 puff into the lungs daily. 1 each 0  . doxycycline (VIBRA-TABS) 100 MG tablet Take 1 tablet (100 mg total) by mouth 2 (two) times daily. 14 tablet 0  . DULoxetine (CYMBALTA) 60 MG capsule Take 60 mg by mouth daily.     . nitroGLYCERIN (NITROSTAT) 0.4 MG SL tablet Place 0.4 mg under the tongue every 5 (five) minutes as needed for chest pain.      No current facility-administered medications for this visit.    Facility-Administered Medications Ordered in Other Visits  Medication Dose Route Frequency Provider Last Rate Last Dose  . heparin lock flush 100 unit/mL  500 Units Intravenous Once [name], MD        OBJECTIVE: Vitals:   11/15/18 0944  BP: 134/81  Pulse: 85  Resp: 18  Temp: 97.6 F (36.4 C)  SpO2: 98%     Body mass index is 28.24 kg/m.    ECOG FS:0 - Asymptomatic  General: Well-developed, well-nourished, no acute distress. Eyes: Pink conjunctiva, anicteric sclera. HEENT: Normocephalic, moist mucous membranes. Lungs: Clear to auscultation  bilaterally. Heart: Regular rate and rhythm. No rubs, murmurs, or gallops. Abdomen: Soft, nontender, nondistended. No organomegaly noted, normoactive bowel sounds. Musculoskeletal: No edema, cyanosis, or clubbing. Neuro: Alert, answering all questions appropriately. Cranial nerves grossly intact. Skin: No rashes or petechiae noted. Psych: Normal affect.  LAB RESULTS:  Lab Results  Component Value Date   NA 133 (L) 11/15/2018   K 3.8 11/15/2018   CL 99 11/15/2018   CO2 26 11/15/2018   GLUCOSE 111 (H) 11/15/2018   BUN 9 11/15/2018   CREATININE 0.80 11/15/2018   CALCIUM 8.9 11/15/2018   PROT 6.5 11/15/2018   ALBUMIN 3.9 11/15/2018   AST 23 11/15/2018   ALT 21 11/15/2018   ALKPHOS 88 11/15/2018   BILITOT 0.5 11/15/2018   GFRNONAA >60 11/15/2018   GFRAA >60 11/15/2018    Lab Results  Component Value Date   WBC 8.4 11/15/2018   NEUTROABS 6.8 11/15/2018   HGB 13.9 11/15/2018   HCT 40.6 11/15/2018   MCV 98.1 11/15/2018   PLT 203 11/15/2018     STUDIES:  ASSESSMENT: Stage IIIa non-small cell lung cancer, right middle lobe.  PLAN:    1. Stage IIIa non-small cell lung cancer, right middle lobe: Case discussed with pathologist and unable to determine whether this is adenocarcinoma or squamous carcinoma.  There is also insufficient tissue to do further testing.  Liquid biopsy did not reveal any actionable mutations.  PET scan was denied by insurance, therefore proceeded with CT scan on July 11, 2018.  Results reviewed independently revealing a progressive nodule in the medial left upper lobe is suspicious but not definitive of malignancy.  The remainder of his known disease is grossly unchanged. MRI of the brain completed on Mar 28, 2018 reviewed independently did not reveal metastatic disease.  Patient completed XRT June 26, 2018.  He completed his concurrent single agent carboplatinum on June 21, 2018.  Patient had a reaction to Taxol during cycle 1 and this was  discontinued.  Patient initiated  maintenance durvalumab on July 12, 2018.  Proceed with cycle 10 of maintenance durvalumab today.  Return to clinic in 2 weeks for treatment only and then in 4 weeks for further evaluation and consideration of cycle 12.   2.  Secondary polycythemia: Resolved.  Likely secondary to heavy tobacco use.  Previously, the remainder of his laboratory work, including JAK-2 mutation, was either negative or within normal limits.  3.  Right upper lobe pulmonary nodule: PET scan results from September 25, 2018 reviewed independently and report as above.  Case was also discussed at cancer conference and thought was this was more consistent with inflammatory or infectious process rather than recurrent malignancy.  Plan was to repeat CT scan in 2 months to assess for interval change.  This is scheduled for December 12, 2018 just prior to his next clinic appointment.   4.  Colon polyps:  Patient has a personal history of greater then 10 adenomatous polyps on his most recent conoloscopy. He does not know of any family history of increased polyps or colon cancer.  Genetic testing to assess for the APC mutation for FAP or AFAP was negative. Continue colonoscopies as per GI. 5. Tobacco Use: Patient continues to heavily smoke.  He expressed understanding by continuing tobacco use increases his chance of recurrence. 6. Anxiety: Chronic.  Continue IV Benadryl with each treatment.  Continue treatment and evaluation per primary care. 7.  Hyponatremia: Chronic and unchanged.  Monitor.  I spent a total of 30 minutes face-to-face with the patient of which greater than 50% of the visit was spent in counseling and coordination of care as detailed above.   Patient expressed understanding and was in agreement with this plan. He also understands that He can call clinic at any time with any questions, concerns, or complaints.    [name], MD   11/16/2018 6:28 AM

## 2018-11-11 NOTE — Progress Notes (Signed)
MERLIN, GOLDEN ([id number]) Visit Report for 11/03/2018 Chief Complaint Document Details Patient Name: Paul Carpenter Date of Service: 11/03/2018 3:45 PM Medical Record Number: [id number] Patient Account Number: [id number] Date of Birth/Sex: 06-27-1956 (63 y.o. M) Treating RN: Paul Carpenter Primary Care Provider: Frazier Carpenter Other Clinician: Referring Provider: Frazier Carpenter Treating Provider/Extender: Paul Carpenter, Paul Carpenter Weeks in Treatment: 3 Information Obtained from: Patient Chief Complaint Left upper arm skin tear Electronic Signature(s) Signed: 11/09/2018 10:02:40 AM By: Paul Keeler Carpenter Entered By: Paul Carpenter on 11/03/2018 16:07:56 Drouillard, Paul Carpenter ([id number]) -------------------------------------------------------------------------------- HPI Details Patient Name: Paul Carpenter Date of Service: 11/03/2018 3:45 PM Medical Record Number: [id number] Patient Account Number: [id number] Date of Birth/Sex: [birth year] (63 y.o. M) Treating RN: Paul Carpenter Primary Care Provider: Frazier Carpenter Other Clinician: Referring Provider: Frazier Carpenter Treating Provider/Extender: Paul Carpenter, Paul Carpenter Weeks in Treatment: 3 History of Present Illness HPI Description: 10/12/18 on evaluation today patient presents for initial evaluation and clinic concerning an issue that he's been having for the past week with his left upper arm where he sustained a skin care. He knows that he did not want to go to an urgent care or the [facility] department due to the fact that he was afraid of contracting something infectious he is a current cancer patient is under the care of the cancer center. With that being said his wife was able to contact our office and fortunately we were able to work the patient in today for evaluation. He does have a history of non-small cell lung cancer on the right. He also has COPD as well as hypertension among other things. Fortunately though he is having  discomfort at the site of the injury this does not appear to be too deep or too significant which is good news. I do believe it's something that can heal quite nicely with the appropriate care. He is currently on an antibiotic, Augmentin, for a sinus infection. 10/19/18 on evaluation today patient's ulcer on the posterior portion of his left upper arm appears to be doing excellent. There does not appear to be any evidence of infection he has a lot of good news skin growth. Overall this seems to be doing great. 11/03/18 upon evaluation today patient's arm actually appears to be doing excellent. In fact this appears to be completely healed. He is having no pain which is great news. Electronic Signature(s) Signed: 11/09/2018 10:02:40 AM By: Paul Keeler Carpenter Entered By: Paul Carpenter on 11/09/2018 10:00:20 [name], [name] ([id number]) -------------------------------------------------------------------------------- Physical Exam Details Patient Name: Paul Carpenter Date of Service: 11/03/2018 3:45 PM Medical Record Number: [id number] Patient Account Number: [id number] Date of Birth/Sex: 1956-03-24 (63 y.o. M) Treating RN: Paul Carpenter Primary Care Provider: Frazier Carpenter Other Clinician: Referring Provider: Frazier Carpenter Treating Provider/Extender: STONE Carpenter, Paul Carpenter Weeks in Treatment: 3 Constitutional Well-nourished and well-hydrated in no acute distress. Respiratory normal breathing without difficulty. Psychiatric this patient is able to make decisions and demonstrates good insight into disease process. Alert and Oriented x 3. pleasant and cooperative. Notes Upon inspection at this point the patient's wound bed shows signs of complete epithelialization which is excellent news. I see no issues at this point. Electronic Signature(s) Signed: 11/09/2018 10:02:40 AM By: Paul Keeler Carpenter Entered By: Paul Carpenter on 11/09/2018 10:00:51 Paul Carpenter  ([id number]) -------------------------------------------------------------------------------- Physician Orders Details Patient Name: Paul Carpenter Date of Service: 11/03/2018 3:45 PM Medical Record Number: [id number] Patient Account Number: [id number] Date of Birth/Sex: 1956/02/18 (63 y.o.  M) Treating RN: Paul Carpenter Primary Care Provider: Frazier Carpenter Other Clinician: Referring Provider: Frazier Carpenter Treating Provider/Extender: Paul Carpenter, Paul Carpenter Weeks in Treatment: 3 Verbal / Phone Orders: No Diagnosis Coding ICD-10 Coding Code Description O35.009F Unspecified open wound of left upper arm, initial encounter I10 Essential (primary) hypertension C34.90 Malignant neoplasm of unspecified part of unspecified bronchus or lung Discharge From [facility] Services o Discharge from [facility] - Call if you have any problems Electronic Signature(s) Signed: 11/09/2018 10:02:40 AM By: Paul Keeler Carpenter Signed: 11/09/2018 2:41:13 PM By: Paul Carpenter Entered By: Paul Carpenter on 11/03/2018 16:30:52 Paul Carpenter ([id number]) -------------------------------------------------------------------------------- Problem List Details Patient Name: Paul Carpenter Date of Service: 11/03/2018 3:45 PM Medical Record Number: [id number] Patient Account Number: [id number] Date of Birth/Sex: 08/24/1956 (63 y.o. M) Treating RN: Paul Carpenter Primary Care Provider: Frazier Carpenter Other Clinician: Referring Provider: Frazier Carpenter Treating Provider/Extender: Paul Carpenter, Paul Carpenter Weeks in Treatment: 3 Active Problems ICD-10 Evaluated Encounter Code Description Active Date Today Diagnosis S41.102A Unspecified open wound of left upper arm, initial encounter 10/13/2018 No Yes I10 Essential (primary) hypertension 10/13/2018 No Yes C34.90 Malignant neoplasm of unspecified part of unspecified 10/13/2018 No Yes bronchus or lung Inactive Problems Resolved Problems Electronic  Signature(s) Signed: 11/09/2018 10:02:40 AM By: Paul Keeler Carpenter Entered By: Paul Carpenter on 11/03/2018 16:07:52 Ch[place]y, Paul Carpenter ([id number]) -------------------------------------------------------------------------------- Progress Note Details Patient Name: Paul Carpenter Date of Service: 11/03/2018 3:45 PM Medical Record Number: [id number] Patient Account Number: [id number] Date of Birth/Sex: June 28, 1956 (63 y.o. M) Treating RN: Paul Carpenter Primary Care Provider: Frazier Carpenter Other Clinician: Referring Provider: Frazier Carpenter Treating Provider/Extender: Paul Carpenter, Paul Carpenter Weeks in Treatment: 3 Subjective Chief Complaint Information obtained from Patient Left upper arm skin tear History of Present Illness (HPI) 10/12/18 on evaluation today patient presents for initial evaluation and clinic concerning an issue that he's been having for the past week with his left upper arm where he sustained a skin care. He knows that he did not want to go to an urgent care or the [facility] department due to the fact that he was afraid of contracting something infectious he is a current cancer patient is under the care of the cancer center. With that being said his wife was able to contact our office and fortunately we were able to work the patient in today for evaluation. He does have a history of non-small cell lung cancer on the right. He also has COPD as well as hypertension among other things. Fortunately though he is having discomfort at the site of the injury this does not appear to be too deep or too significant which is good news. I do believe it's something that can heal quite nicely with the appropriate care. He is currently on an antibiotic, Augmentin, for a sinus infection. 10/19/18 on evaluation today patient's ulcer on the posterior portion of his left upper arm appears to be doing excellent. There does not appear to be any evidence of infection he has a lot of good news skin  growth. Overall this seems to be doing great. 11/03/18 upon evaluation today patient's arm actually appears to be doing excellent. In fact this appears to be completely healed. He is having no pain which is great news. Patient History Information obtained from Patient. Family History Cancer - Child,Father, Diabetes - Paternal Grandparents, Heart Disease - Father, Hypertension - Father, Lung Disease - Father, Stroke - Father, No family history of Hereditary Spherocytosis, Kidney Disease, Seizures, Thyroid Problems, Tuberculosis. Social  History Current every day smoker, Marital Status - Married, Alcohol Use - Moderate, Drug Use - No History, Caffeine Use - Daily. Medical And Surgical History Notes Respiratory stage 3 lung cancer Cardiovascular irregular heartbeat Review of Systems (ROS) Constitutional Symptoms (General Health) Denies complaints or symptoms of Fever, Chills. Respiratory The patient has no complaints or symptoms. Cardiovascular The patient has no complaints or symptoms. Psychiatric Paul, Carpenter ([id number]) The patient has no complaints or symptoms. Objective Constitutional Well-nourished and well-hydrated in no acute distress. Vitals Time Taken: 3:55 PM, Height: 72 in, Weight: 206 lbs, BMI: 27.9, Temperature: 98.4 F, Pulse: 97 bpm, Respiratory Rate: 16 breaths/min, Blood Pressure: 128/84 mmHg. Respiratory normal breathing without difficulty. Psychiatric this patient is able to make decisions and demonstrates good insight into disease process. Alert and Oriented x 3. pleasant and cooperative. General Notes: Upon inspection at this point the patient's wound bed shows signs of complete epithelialization which is excellent news. I see no issues at this point. Integumentary (Hair, Skin) Wound #1 status is Healed - Epithelialized. Original cause of wound was Trauma. The wound is located on the Left Upper Arm. The wound measures 0cm length x 0cm width x 0cm depth;  0cm^2 area and 0cm^3 volume. Assessment Active Problems ICD-10 Unspecified open wound of left upper arm, initial encounter Essential (primary) hypertension Malignant neoplasm of unspecified part of unspecified bronchus or lung Plan Discharge From [facility] Services: Discharge from [place] - Call if you have any problems Paul, ARRANTS. ([id number]) Currently I'm gonna recommend that we discontinue wound care services. The patient is in agreement with that plan. If anything changes or worsens in the meantime he will let us know. Electronic Signature(s) Signed: 11/09/2018 10:02:40 AM By: Paul Keeler Carpenter Entered By: Paul Carpenter on 11/09/2018 10:01:08 Paul, Carpenter ([id number]) -------------------------------------------------------------------------------- ROS/PFSH Details Patient Name: Paul Carpenter Date of Service: 11/03/2018 3:45 PM Medical Record Number: [id number] Patient Account Number: [id number] Date of Birth/Sex: 1956/06/28 (63 y.o. M) Treating RN: Paul Carpenter Primary Care Provider: Frazier Carpenter Other Clinician: Referring Provider: Frazier Carpenter Treating Provider/Extender: Paul Carpenter, Paul Carpenter Weeks in Treatment: 3 Information Obtained From Patient Wound History Do you currently have one or more open woundso Yes How many open wounds do you currently haveo 1 Approximately how long have you had your woundso 1 week How have you been treating your wound(s) until nowo vaseline and gauze Has your wound(s) ever healed and then re-openedo No Have you had any lab work done in the past montho Yes Who ordered the lab work doneo cancer center Have you tested positive for an antibiotic resistant organism (MRSA, VRE)o No Have you tested positive for osteomyelitis (bone infection)o No Have you had any tests for circulation on your legso No Constitutional Symptoms (General Health) Complaints and Symptoms: Negative for: Fever; Chills Eyes Medical  History: Negative for: Cataracts; Glaucoma; Optic Neuritis Ear/Nose/Mouth/Throat Medical History: Negative for: Chronic sinus problems/congestion; Middle ear problems Hematologic/Lymphatic Medical History: Positive for: Anemia Negative for: Hemophilia; Human Immunodeficiency Virus; Lymphedema; Sickle Cell Disease Respiratory Complaints and Symptoms: No Complaints or Symptoms Medical History: Positive for: Chronic Obstructive Pulmonary Disease (COPD) Negative for: Aspiration; Asthma; Pneumothorax; Sleep Apnea; Tuberculosis Past Medical History Notes: stage 3 lung cancer Cardiovascular Paul, UTTER. ([id number]) Complaints and Symptoms: No Complaints or Symptoms Medical History: Positive for: Hypertension Negative for: Angina; Arrhythmia; Congestive Heart Failure; Coronary Artery Disease; Deep Vein Thrombosis; Hypotension; Myocardial Infarction; Peripheral Arterial Disease; Peripheral Venous Disease; Phlebitis; Vasculitis Past Medical History Notes: irregular  heartbeat Gastrointestinal Medical History: Negative for: Cirrhosis ; Colitis; Crohnos; Hepatitis A; Hepatitis B; Hepatitis C Endocrine Medical History: Negative for: Type I Diabetes; Type II Diabetes Genitourinary Medical History: Negative for: End Stage Renal Disease Immunological Medical History: Negative for: Lupus Erythematosus; Raynaudos; Scleroderma Integumentary (Skin) Medical History: Negative for: History of Burn; History of pressure wounds Musculoskeletal Medical History: Negative for: Gout; Rheumatoid Arthritis; Osteoarthritis; Osteomyelitis Neurologic Medical History: Positive for: Neuropathy Negative for: Dementia; Quadriplegia; Paraplegia; Seizure Disorder Oncologic Medical History: Positive for: Received Chemotherapy; Received Radiation - 37 treatments Psychiatric Complaints and Symptoms: No Complaints or Symptoms Medical History: Negative for: Anorexia/bulimia; Confinement  Anxiety Paul Carpenter, Paul N. ([id number]) Immunizations Pneumococcal Vaccine: Received Pneumococcal Vaccination: Yes Implantable Devices Family and Social History Cancer: Yes - Child,Father; Diabetes: Yes - Paternal Grandparents; Heart Disease: Yes - Father; Hereditary Spherocytosis: No; Hypertension: Yes - Father; Kidney Disease: No; Lung Disease: Yes - Father; Seizures: No; Stroke: Yes - Father; Thyroid Problems: No; Tuberculosis: No; Current every day smoker; Marital Status - Married; Alcohol Use: Moderate; Drug Use: No History; Caffeine Use: Daily; Financial Concerns: No; Food, Clothing or Shelter Needs: No; Support System Lacking: No; Transportation Concerns: No; Advanced Directives: No; Patient does not want information on Advanced Directives Physician Affirmation I have reviewed and agree with the above information. Electronic Signature(s) Signed: 11/09/2018 10:02:40 AM By: Paul Keeler Carpenter Signed: 11/09/2018 5:07:23 PM By: Paul Carpenter Entered By: Paul Carpenter on 11/09/2018 10:00:35 Paul Carpenter ([id number]) -------------------------------------------------------------------------------- SuperBill Details Patient Name: Paul Carpenter Date of Service: 11/03/2018 Medical Record Number: [id number] Patient Account Number: [id number] Date of Birth/Sex: Feb 10, 1956 (63 y.o. M) Treating RN: Paul Carpenter Primary Care Provider: Frazier Carpenter Other Clinician: Referring Provider: Frazier Carpenter Treating Provider/Extender: Paul Carpenter, Paul Carpenter Weeks in Treatment: 3 Diagnosis Coding ICD-10 Codes Code Description S28.768T Unspecified open wound of left upper arm, initial encounter I10 Essential (primary) hypertension C34.90 Malignant neoplasm of unspecified part of unspecified bronchus or lung Facility Procedures CPT4 Code: [id number] Description: [phone] - WOUND CARE VISIT-LEV 2 EST PT Modifier: Quantity: 1 Physician Procedures CPT4 Code: [id number] Description: [id number] - WC  PHYS LEVEL 2 - EST PT ICD-10 Diagnosis Description S41.102A Unspecified open wound of left upper arm, initial encounte I10 Essential (primary) hypertension C34.90 Malignant neoplasm of unspecified part of unspecified bron Modifier: r chus or lung Quantity: 1 Electronic Signature(s) Signed: 11/09/2018 10:02:40 AM By: Paul Keeler Carpenter Entered By: Paul Carpenter on 11/05/2018 23:57:02

## 2018-11-14 ENCOUNTER — Other Ambulatory Visit: Payer: Self-pay | Admitting: Oncology

## 2018-11-15 ENCOUNTER — Inpatient Hospital Stay (HOSPITAL_BASED_OUTPATIENT_CLINIC_OR_DEPARTMENT_OTHER): Payer: BLUE CROSS/BLUE SHIELD | Admitting: Oncology

## 2018-11-15 ENCOUNTER — Encounter: Payer: Self-pay | Admitting: Oncology

## 2018-11-15 ENCOUNTER — Other Ambulatory Visit: Payer: Self-pay

## 2018-11-15 ENCOUNTER — Inpatient Hospital Stay: Payer: BLUE CROSS/BLUE SHIELD

## 2018-11-15 VITALS — BP 134/81 | HR 85 | Temp 97.6°F | Resp 18 | Wt 208.2 lb

## 2018-11-15 DIAGNOSIS — I10 Essential (primary) hypertension: Secondary | ICD-10-CM

## 2018-11-15 DIAGNOSIS — I2510 Atherosclerotic heart disease of native coronary artery without angina pectoris: Secondary | ICD-10-CM

## 2018-11-15 DIAGNOSIS — Z923 Personal history of irradiation: Secondary | ICD-10-CM | POA: Diagnosis not present

## 2018-11-15 DIAGNOSIS — K219 Gastro-esophageal reflux disease without esophagitis: Secondary | ICD-10-CM

## 2018-11-15 DIAGNOSIS — F419 Anxiety disorder, unspecified: Secondary | ICD-10-CM

## 2018-11-15 DIAGNOSIS — R0989 Other specified symptoms and signs involving the circulatory and respiratory systems: Secondary | ICD-10-CM | POA: Diagnosis not present

## 2018-11-15 DIAGNOSIS — C342 Malignant neoplasm of middle lobe, bronchus or lung: Secondary | ICD-10-CM

## 2018-11-15 DIAGNOSIS — J449 Chronic obstructive pulmonary disease, unspecified: Secondary | ICD-10-CM | POA: Diagnosis not present

## 2018-11-15 DIAGNOSIS — Z79899 Other long term (current) drug therapy: Secondary | ICD-10-CM

## 2018-11-15 DIAGNOSIS — C3491 Malignant neoplasm of unspecified part of right bronchus or lung: Secondary | ICD-10-CM

## 2018-11-15 DIAGNOSIS — E871 Hypo-osmolality and hyponatremia: Secondary | ICD-10-CM

## 2018-11-15 DIAGNOSIS — Z86010 Personal history of colonic polyps: Secondary | ICD-10-CM

## 2018-11-15 DIAGNOSIS — I7300 Raynaud's syndrome without gangrene: Secondary | ICD-10-CM

## 2018-11-15 DIAGNOSIS — Z8042 Family history of malignant neoplasm of prostate: Secondary | ICD-10-CM

## 2018-11-15 DIAGNOSIS — F17210 Nicotine dependence, cigarettes, uncomplicated: Secondary | ICD-10-CM

## 2018-11-15 LAB — COMPREHENSIVE METABOLIC PANEL
ALK PHOS: 88 U/L (ref 38–126)
ALT: 21 U/L (ref 0–44)
ANION GAP: 8 (ref 5–15)
AST: 23 U/L (ref 15–41)
Albumin: 3.9 g/dL (ref 3.5–5.0)
BUN: 9 mg/dL (ref 8–23)
CO2: 26 mmol/L (ref 22–32)
Calcium: 8.9 mg/dL (ref 8.9–10.3)
Chloride: 99 mmol/L (ref 98–111)
Creatinine, Ser: 0.8 mg/dL (ref 0.61–1.24)
GFR calc Af Amer: >60 mL/min (ref >60)
GFR calc non Af Amer: >60 mL/min (ref >60)
Glucose, Bld: 111 mg/dL — ABNORMAL HIGH (ref 70–99)
POTASSIUM: 3.8 mmol/L (ref 3.5–5.1)
SODIUM: 133 mmol/L — AB (ref 135–145)
Total Bilirubin: 0.5 mg/dL (ref 0.3–1.2)
Total Protein: 6.5 g/dL (ref 6.5–8.1)

## 2018-11-15 LAB — CBC WITH DIFFERENTIAL/PLATELET
ABS IMMATURE GRANULOCYTES: 0.02 10*3/uL (ref 0.00–0.07)
Basophils Absolute: 0.1 10*3/uL (ref 0.0–0.1)
Basophils Relative: 1 %
Eosinophils Absolute: 0.2 10*3/uL (ref 0.0–0.5)
Eosinophils Relative: 2 %
HEMATOCRIT: 40.6 % (ref 39.0–52.0)
HEMOGLOBIN: 13.9 g/dL (ref 13.0–17.0)
IMMATURE GRANULOCYTES: 0 %
LYMPHS ABS: 0.8 10*3/uL (ref 0.7–4.0)
LYMPHS PCT: 9 %
MCH: 33.6 pg (ref 26.0–34.0)
MCHC: 34.2 g/dL (ref 30.0–36.0)
MCV: 98.1 fL (ref 80.0–100.0)
MONO ABS: 0.6 10*3/uL (ref 0.1–1.0)
Monocytes Relative: 7 %
NEUTROS ABS: 6.8 10*3/uL (ref 1.7–7.7)
NEUTROS PCT: 81 %
PLATELETS: 203 10*3/uL (ref 150–400)
RBC: 4.14 MIL/uL — ABNORMAL LOW (ref 4.22–5.81)
RDW: 11.9 % (ref 11.5–15.5)
WBC: 8.4 10*3/uL (ref 4.0–10.5)
nRBC: 0 % (ref 0.0–0.2)

## 2018-11-15 MED ORDER — SODIUM CHLORIDE 0.9% FLUSH
10.0000 mL | INTRAVENOUS | Status: DC | PRN
  Administered 2018-11-15: 10 mL via INTRAVENOUS
  Filled 2018-11-15: qty 10

## 2018-11-15 MED ORDER — DOXYCYCLINE HYCLATE 100 MG PO TABS: 100.0000 mg | ORAL_TABLET | Freq: Two times a day (BID) | ORAL | 0 refills | Status: DC

## 2018-11-15 MED ORDER — DEXAMETHASONE SODIUM PHOSPHATE 10 MG/ML IJ SOLN
10.0000 mg | Freq: Once | INTRAMUSCULAR | Status: AC
  Administered 2018-11-15: 10 mg via INTRAVENOUS
  Filled 2018-11-15: qty 1

## 2018-11-15 MED ORDER — SODIUM CHLORIDE 0.9 % IV SOLN
1000.0000 mg | Freq: Once | INTRAVENOUS | Status: AC
  Administered 2018-11-15: 1000 mg via INTRAVENOUS
  Filled 2018-11-15: qty 20

## 2018-11-15 MED ORDER — SODIUM CHLORIDE 0.9 % IV SOLN
Freq: Once | INTRAVENOUS | Status: AC
  Administered 2018-11-15: 11:00:00 via INTRAVENOUS
  Filled 2018-11-15: qty 250

## 2018-11-15 MED ORDER — HEPARIN SOD (PORK) LOCK FLUSH 100 UNIT/ML IV SOLN
500.0000 [IU] | Freq: Once | INTRAVENOUS | Status: AC
  Administered 2018-11-15: 500 [IU] via INTRAVENOUS
  Filled 2018-11-15: qty 5

## 2018-11-15 MED ORDER — DIPHENHYDRAMINE HCL 50 MG/ML IJ SOLN
25.0000 mg | Freq: Once | INTRAMUSCULAR | Status: AC
  Administered 2018-11-15: 25 mg via INTRAVENOUS
  Filled 2018-11-15: qty 1

## 2018-11-15 NOTE — Progress Notes (Signed)
Patient reports productive cough with green sputum, denies other concerns today.

## 2018-11-17 LAB — THYROID PANEL WITH TSH
Free Thyroxine Index: 2.5 (ref 1.2–4.9)
T3 Uptake Ratio: 25 % (ref 24–39)
T4 TOTAL: 10 ug/dL (ref 4.5–12.0)
TSH: 0.995 u[IU]/mL (ref 0.450–4.500)

## 2018-11-29 ENCOUNTER — Ambulatory Visit: Payer: BLUE CROSS/BLUE SHIELD

## 2018-11-29 ENCOUNTER — Inpatient Hospital Stay: Payer: BLUE CROSS/BLUE SHIELD

## 2018-11-29 VITALS — BP 135/82 | HR 96 | Temp 96.5°F | Resp 18

## 2018-11-29 DIAGNOSIS — C3491 Malignant neoplasm of unspecified part of right bronchus or lung: Secondary | ICD-10-CM

## 2018-11-29 DIAGNOSIS — C342 Malignant neoplasm of middle lobe, bronchus or lung: Secondary | ICD-10-CM | POA: Diagnosis not present

## 2018-11-29 MED ORDER — HEPARIN SOD (PORK) LOCK FLUSH 100 UNIT/ML IV SOLN
500.0000 [IU] | Freq: Once | INTRAVENOUS | Status: AC
  Administered 2018-11-29: 500 [IU] via INTRAVENOUS
  Filled 2018-11-29: qty 5

## 2018-11-29 MED ORDER — SODIUM CHLORIDE 0.9% FLUSH
10.0000 mL | INTRAVENOUS | Status: DC | PRN
  Administered 2018-11-29: 10 mL via INTRAVENOUS
  Filled 2018-11-29: qty 10

## 2018-11-29 MED ORDER — DEXAMETHASONE SODIUM PHOSPHATE 10 MG/ML IJ SOLN
10.0000 mg | Freq: Once | INTRAMUSCULAR | Status: AC
  Administered 2018-11-29: 10 mg via INTRAVENOUS
  Filled 2018-11-29: qty 1

## 2018-11-29 MED ORDER — SODIUM CHLORIDE 0.9 % IV SOLN
1000.0000 mg | Freq: Once | INTRAVENOUS | Status: AC
  Administered 2018-11-29: 1000 mg via INTRAVENOUS
  Filled 2018-11-29: qty 20

## 2018-11-29 MED ORDER — DIPHENHYDRAMINE HCL 50 MG/ML IJ SOLN
25.0000 mg | Freq: Once | INTRAMUSCULAR | Status: AC
  Administered 2018-11-29: 25 mg via INTRAVENOUS
  Filled 2018-11-29: qty 1

## 2018-11-29 MED ORDER — SODIUM CHLORIDE 0.9 % IV SOLN
Freq: Once | INTRAVENOUS | Status: AC
  Administered 2018-11-29: 14:00:00 via INTRAVENOUS
  Filled 2018-11-29: qty 250

## 2018-12-08 NOTE — Progress Notes (Signed)
[facility]  [phone]) [phone] [phone]) [phone]  ID: [name] OB: May 24, 1956  MR#: [id number]  LZJ#:[id number]  Patient Care Team: Kirk Ruths, MD as PCP - General (Internal Medicine)  CHIEF COMPLAINT: Stage IIIa non-small cell lung cancer, right middle lobe.  INTERVAL HISTORY: Patient returns to clinic today for further evaluation, discussion of his imaging results, and consideration of cycle 12 of maintenance durvalumab.  He continues to be highly anxious.  He has increased weakness and fatigue.  He continues to complain of chronic cough and musculoskeletal chest discomfort.  He has no neurologic complaints. He denies any fevers.  He does not complain of dysphasia today.  He denies any hemoptysis or shortness of breath.  He denies any nausea, vomiting, constipation, or diarrhea. He has no urinary complaints.  Patient offers no further specific complaints today.  REVIEW OF SYSTEMS:   Review of Systems  Constitutional: Positive for malaise/fatigue. Negative for fever and weight loss.  HENT: Negative.  Negative for congestion and sore throat.   Respiratory: Positive for cough. Negative for hemoptysis and shortness of breath.   Cardiovascular: Positive for chest pain. Negative for leg swelling.  Gastrointestinal: Negative.  Negative for abdominal pain, blood in stool and melena.  Genitourinary: Negative.  Negative for dysuria.  Musculoskeletal: Negative.  Negative for joint pain.  Skin: Negative.  Negative for rash.  Neurological: Positive for weakness. Negative for sensory change, focal weakness and headaches.  Psychiatric/Behavioral: The patient is nervous/anxious.     As per HPI. Otherwise, a complete review of systems is negative.  PAST MEDICAL HISTORY: Past Medical History:  Diagnosis Date  . Anginal pain ([place])   . Anxiety   . Chest pain   . Chicken pox   . Complication of anesthesia    o2 dropped after neck fusion  . COPD (chronic  obstructive pulmonary disease) ([place])   . Coronary artery disease   . Cough    chronic  clear phlegm  . Dysrhythmia    palpitations  . GERD (gastroesophageal reflux disease)    h/o reflux/ hoarsness  . Hematochezia   . Hemorrhoids   . History of chickenpox   . History of colon polyps   . History of Helicobacter pylori infection   . Hoarseness   . Hypertension   . Lung cancer ([place]) 05/2016   Chemo + rad tx's.   . Migraines   . OSA (obstructive sleep apnea)    has CPAP but does not use  . Personal history of tobacco use, presenting hazards to health 03/05/2016  . Pneumonia    5/17  . Raynaud disease   . Raynaud disease   . Raynaud's disease   . Rotator cuff tear    on right  . Shortness of breath dyspnea   . Sleep apnea   . Ulcer (traumatic) of oral mucosa     PAST SURGICAL HISTORY: Past Surgical History:  Procedure Laterality Date  . BACK SURGERY     cervical fusion x 2  . CARDIAC CATHETERIZATION    . CERVICAL DISCECTOMY     x 2  . COLONOSCOPY    . COLONOSCOPY N/A 07/25/2015   Procedure: COLONOSCOPY;  Surgeon: Lollie Sails, MD;  [facility]COPY;  Service: Endoscopy;  Laterality: N/A;  . COLONOSCOPY WITH PROPOFOL N/A 10/04/2017   Procedure: COLONOSCOPY WITH PROPOFOL;  Surgeon: Lollie Sails, MD;  Location: [facility] ENDOSCOPY;  Service: Endoscopy;  Laterality: N/A;  . ELECTROMAGNETIC NAVIGATION BROCHOSCOPY Left 06/28/2016   Procedure:  ELECTROMAGNETIC NAVIGATION BRONCHOSCOPY;  Surgeon: Vilinda Boehringer, MD;  Location: [facility] ORS;  Service: Cardiopulmonary;  Laterality: Left;  . ENDOBRONCHIAL ULTRASOUND N/A 04/11/2018   Procedure: ENDOBRONCHIAL ULTRASOUND;  Surgeon: [name], MD;  Location: [facility] ORS;  Service: Cardiopulmonary;  Laterality: N/A;  . ESOPHAGOGASTRODUODENOSCOPY N/A 07/25/2015   Procedure: ESOPHAGOGASTRODUODENOSCOPY (EGD);  Surgeon: Lollie Sails, MD;  Location: [facility] ENDOSCOPY;  Service: Endoscopy;  Laterality: N/A;  . NASAL SINUS SURGERY     x 2    . PORTA CATH INSERTION N/A 04/24/2018   Procedure: PORTA CATH INSERTION;  Surgeon: Algernon Huxley, MD;  Location: [place] CV LAB;  Service: Cardiovascular;  Laterality: N/A;  . rotator cuff surgery Right    07/2016  . SEPTOPLASTY    . SKIN GRAFT      FAMILY HISTORY Family History  Problem Relation Age of Onset  . Heart disease Father   . Prostate cancer Father   . Heart disease Paternal Grandmother   . Heart attack Maternal Grandfather 52  . Kidney cancer Neg Hx   . Bladder Cancer Neg Hx   . Other Neg Hx        pituitary abnormality       ADVANCED DIRECTIVES:    HEALTH MAINTENANCE: Social History   Tobacco Use  . Smoking status: Current Every Day Smoker    Packs/day: 1.50    Years: 45.00    Pack years: 67.50    Types: Cigarettes  . Smokeless tobacco: Never Used  Substance Use Topics  . Alcohol use: Yes    Alcohol/week: 2.0 standard drinks    Types: 2 Standard drinks or equivalent per week    Comment: moderate  . Drug use: No     Allergies  Allergen Reactions  . Lisinopril Rash  . Varenicline Rash    Current Outpatient Medications  Medication Sig Dispense Refill  . acetaminophen (TYLENOL) 500 MG tablet Take 1,000 mg by mouth every 6 (six) hours as needed (for pain.).    Marland Kitchen albuterol (PROAIR HFA) 108 (90 Base) MCG/ACT inhaler Inhale 2 puffs into the lungs every 4 (four) hours as needed for wheezing or shortness of breath. 1 Inhaler 1  . atorvastatin (LIPITOR) 40 MG tablet Take 40 mg by mouth at bedtime.     . busPIRone (BUSPAR) 15 MG tablet Take 15 mg by mouth 2 (two) times daily.   10  . diazepam (VALIUM) 5 MG tablet Take 5 mg by mouth every 12 (twelve) hours as needed for anxiety (SCHEDULED EVERY NIGHT).     . DULoxetine (CYMBALTA) 60 MG capsule Take 1 capsule by mouth 1 day or 1 dose.    . fluticasone (FLONASE) 50 MCG/ACT nasal spray Place 1 spray into both nostrils daily as needed (for allergies.).     Marland Kitchen ipratropium-albuterol (DUONEB) 0.5-2.5 (3)  MG/3ML SOLN Take 3 mLs by nebulization every 6 (six) hours. 360 mL 5  . lidocaine (XYLOCAINE) 2 % solution   2  . metoprolol tartrate (LOPRESSOR) 50 MG tablet Take 50 mg by mouth 2 (two) times daily.    . mirabegron ER (MYRBETRIQ) 50 MG TB24 tablet Take 1 tablet (50 mg total) by mouth daily. 90 tablet 3  . Multiple Vitamin (MULTIVITAMIN WITH MINERALS) TABS tablet Take 1 tablet by mouth daily.     . nicotine (NICODERM CQ - DOSED IN MG/24 HOURS) 21 mg/24hr patch Place 1 patch (21 mg total) onto the skin daily. 30 patch 12  . ondansetron (ZOFRAN) 8 MG tablet Take 8  mg by mouth every 8 (eight) hours as needed for nausea or vomiting.    . OXYGEN Inhale 2 L into the lungs at bedtime.     . pantoprazole (PROTONIX) 40 MG tablet Take 40 mg by mouth daily.  3  . potassium chloride (K-DUR) 10 MEQ tablet TAKE 1 TABLET (10 MEQ TOTAL) BY MOUTH ONCE DAILY.  3  . umeclidinium-vilanterol (ANORO ELLIPTA) 62.5-25 MCG/INH AEPB Inhale 1 puff into the lungs daily. 1 each 10  . umeclidinium-vilanterol (ANORO ELLIPTA) 62.5-25 MCG/INH AEPB Inhale 1 puff into the lungs daily. 1 each 0  . nitroGLYCERIN (NITROSTAT) 0.4 MG SL tablet Place 0.4 mg under the tongue every 5 (five) minutes as needed for chest pain.      No current facility-administered medications for this visit.    Facility-Administered Medications Ordered in Other Visits  Medication Dose Route Frequency Provider Last Rate Last Dose  . heparin lock flush 100 unit/mL  500 Units Intravenous Once [name], MD        OBJECTIVE: Vitals:   12/13/18 0957  BP: 127/76  Pulse: 85  Temp: 97.6 F (36.4 C)  SpO2: 98%     Body mass index is 28.18 kg/m.    ECOG FS:0 - Asymptomatic  General: Well-developed, well-nourished, no acute distress. Eyes: Pink conjunctiva, anicteric sclera. HEENT: Normocephalic, moist mucous membranes. Lungs: Clear to auscultation bilaterally. Heart: Regular rate and rhythm. No rubs, murmurs, or gallops. Abdomen: Soft,  nontender, nondistended. No organomegaly noted, normoactive bowel sounds. Musculoskeletal: No edema, cyanosis, or clubbing. Neuro: Alert, answering all questions appropriately. Cranial nerves grossly intact. Skin: No rashes or petechiae noted. Psych: Normal affect.  LAB RESULTS:  Lab Results  Component Value Date   NA 131 (L) 12/13/2018   K 4.1 12/13/2018   CL 98 12/13/2018   CO2 28 12/13/2018   GLUCOSE 95 12/13/2018   BUN 9 12/13/2018   CREATININE 0.71 12/13/2018   CALCIUM 8.8 (L) 12/13/2018   PROT 7.1 12/13/2018   ALBUMIN 3.7 12/13/2018   AST 20 12/13/2018   ALT 19 12/13/2018   ALKPHOS 110 12/13/2018   BILITOT 0.5 12/13/2018   GFRNONAA >60 12/13/2018   GFRAA >60 12/13/2018    Lab Results  Component Value Date   WBC 10.9 (H) 12/13/2018   NEUTROABS 9.1 (H) 12/13/2018   HGB 13.5 12/13/2018   HCT 38.7 (L) 12/13/2018   MCV 94.6 12/13/2018   PLT 232 12/13/2018     STUDIES:  ASSESSMENT: Stage IIIa non-small cell lung cancer, right middle lobe.  PLAN:    1. Stage IIIa non-small cell lung cancer, right middle lobe: Case discussed with pathologist and unable to determine whether this is adenocarcinoma or squamous carcinoma.  There is also insufficient tissue to do further testing.  Liquid biopsy did not reveal any actionable mutations.  CT scan results from December 12, 2018 reviewed independently and report as above revealing stable versus mildly decreased pulmonary nodules. MRI of the brain completed on Mar 28, 2018 reviewed independently did not reveal metastatic disease.  Patient completed XRT June 26, 2018.  He completed his concurrent single agent carboplatinum on June 21, 2018.  Patient had a reaction to Taxol during cycle 1 and this was discontinued.  Patient initiated maintenance durvalumab on July 12, 2018.  Proceed with cycle 12 of maintenance durvalumab today.  Return to clinic in 2 weeks for treatment only and then in 4 weeks for further evaluation and  consideration of cycle 14.  2.  Secondary polycythemia: Resolved.  Likely secondary to heavy tobacco use.  Previously, the remainder of his laboratory work, including JAK-2 mutation, was either negative or within normal limits.  3.  Right upper lobe pulmonary nodule: CT scan results as above.  Proceed with treatment as planned.  4.  Colon polyps:  Patient has a personal history of greater then 10 adenomatous polyps on his most recent conoloscopy. He does not know of any family history of increased polyps or colon cancer.  Genetic testing to assess for the APC mutation for FAP or AFAP was negative. Continue colonoscopies as per GI. 5. Tobacco Use: Patient continues to heavily smoke.  He expressed understanding by continuing tobacco use increases his chance of recurrence. 6. Anxiety: Chronic.  Continue IV Benadryl with each treatment.  Continue treatment and evaluation per primary care. 7.  Hyponatremia: Chronic and unchanged.  Monitor. 8.  Chest pain: Likely musculoskeletal initial secondary to chronic cough.   Patient expressed understanding and was in agreement with this plan. He also understands that He can call clinic at any time with any questions, concerns, or complaints.    [name], MD   12/13/2018 1:04 PM

## 2018-12-12 ENCOUNTER — Ambulatory Visit
Admission: RE | Admit: 2018-12-12 | Discharge: 2018-12-12 | Disposition: A | Discharge status: Home / Self Care | Payer: BLUE CROSS/BLUE SHIELD | Source: Ambulatory Visit | Attending: Oncology | Admitting: Oncology

## 2018-12-12 ENCOUNTER — Ambulatory Visit: Admission: RE | Admit: 2018-12-12 | Payer: BLUE CROSS/BLUE SHIELD | Source: Ambulatory Visit

## 2018-12-12 DIAGNOSIS — R911 Solitary pulmonary nodule: Secondary | ICD-10-CM

## 2018-12-12 MED ORDER — IOPAMIDOL (ISOVUE-300) INJECTION 61%
75.0000 mL | Freq: Once | INTRAVENOUS | Status: AC | PRN
  Administered 2018-12-12: 75 mL via INTRAVENOUS

## 2018-12-13 ENCOUNTER — Inpatient Hospital Stay: Payer: BLUE CROSS/BLUE SHIELD

## 2018-12-13 ENCOUNTER — Inpatient Hospital Stay: Payer: BLUE CROSS/BLUE SHIELD | Attending: Oncology

## 2018-12-13 ENCOUNTER — Other Ambulatory Visit: Payer: Self-pay

## 2018-12-13 ENCOUNTER — Inpatient Hospital Stay (HOSPITAL_BASED_OUTPATIENT_CLINIC_OR_DEPARTMENT_OTHER): Payer: BLUE CROSS/BLUE SHIELD | Admitting: Oncology

## 2018-12-13 VITALS — BP 127/76 | HR 85 | Temp 97.6°F | Wt 207.8 lb

## 2018-12-13 DIAGNOSIS — Z923 Personal history of irradiation: Secondary | ICD-10-CM | POA: Insufficient documentation

## 2018-12-13 DIAGNOSIS — R531 Weakness: Secondary | ICD-10-CM

## 2018-12-13 DIAGNOSIS — Z9981 Dependence on supplemental oxygen: Secondary | ICD-10-CM

## 2018-12-13 DIAGNOSIS — K219 Gastro-esophageal reflux disease without esophagitis: Secondary | ICD-10-CM | POA: Diagnosis not present

## 2018-12-13 DIAGNOSIS — I7300 Raynaud's syndrome without gangrene: Secondary | ICD-10-CM | POA: Insufficient documentation

## 2018-12-13 DIAGNOSIS — Z9221 Personal history of antineoplastic chemotherapy: Secondary | ICD-10-CM | POA: Diagnosis not present

## 2018-12-13 DIAGNOSIS — R0789 Other chest pain: Secondary | ICD-10-CM | POA: Insufficient documentation

## 2018-12-13 DIAGNOSIS — R5383 Other fatigue: Secondary | ICD-10-CM

## 2018-12-13 DIAGNOSIS — Z5112 Encounter for antineoplastic immunotherapy: Secondary | ICD-10-CM | POA: Diagnosis not present

## 2018-12-13 DIAGNOSIS — C342 Malignant neoplasm of middle lobe, bronchus or lung: Secondary | ICD-10-CM

## 2018-12-13 DIAGNOSIS — F17210 Nicotine dependence, cigarettes, uncomplicated: Secondary | ICD-10-CM | POA: Insufficient documentation

## 2018-12-13 DIAGNOSIS — Z79899 Other long term (current) drug therapy: Secondary | ICD-10-CM | POA: Insufficient documentation

## 2018-12-13 DIAGNOSIS — R5381 Other malaise: Secondary | ICD-10-CM

## 2018-12-13 DIAGNOSIS — I2510 Atherosclerotic heart disease of native coronary artery without angina pectoris: Secondary | ICD-10-CM | POA: Diagnosis not present

## 2018-12-13 DIAGNOSIS — C3491 Malignant neoplasm of unspecified part of right bronchus or lung: Secondary | ICD-10-CM

## 2018-12-13 DIAGNOSIS — E871 Hypo-osmolality and hyponatremia: Secondary | ICD-10-CM

## 2018-12-13 DIAGNOSIS — G4733 Obstructive sleep apnea (adult) (pediatric): Secondary | ICD-10-CM | POA: Diagnosis not present

## 2018-12-13 DIAGNOSIS — I10 Essential (primary) hypertension: Secondary | ICD-10-CM | POA: Diagnosis not present

## 2018-12-13 DIAGNOSIS — R05 Cough: Secondary | ICD-10-CM

## 2018-12-13 DIAGNOSIS — F419 Anxiety disorder, unspecified: Secondary | ICD-10-CM

## 2018-12-13 DIAGNOSIS — Z8042 Family history of malignant neoplasm of prostate: Secondary | ICD-10-CM

## 2018-12-13 DIAGNOSIS — J449 Chronic obstructive pulmonary disease, unspecified: Secondary | ICD-10-CM | POA: Insufficient documentation

## 2018-12-13 LAB — COMPREHENSIVE METABOLIC PANEL
ALT: 19 U/L (ref 0–44)
AST: 20 U/L (ref 15–41)
Albumin: 3.7 g/dL (ref 3.5–5.0)
Alkaline Phosphatase: 110 U/L (ref 38–126)
Anion gap: 5 (ref 5–15)
BUN: 9 mg/dL (ref 8–23)
CO2: 28 mmol/L (ref 22–32)
Calcium: 8.8 mg/dL — ABNORMAL LOW (ref 8.9–10.3)
Chloride: 98 mmol/L (ref 98–111)
Creatinine, Ser: 0.71 mg/dL (ref 0.61–1.24)
GFR calc Af Amer: >60 mL/min (ref >60)
GFR calc non Af Amer: >60 mL/min (ref >60)
Glucose, Bld: 95 mg/dL (ref 70–99)
Potassium: 4.1 mmol/L (ref 3.5–5.1)
Sodium: 131 mmol/L — ABNORMAL LOW (ref 135–145)
Total Bilirubin: 0.5 mg/dL (ref 0.3–1.2)
Total Protein: 7.1 g/dL (ref 6.5–8.1)

## 2018-12-13 LAB — CBC WITH DIFFERENTIAL/PLATELET
Abs Immature Granulocytes: 0.03 10*3/uL (ref 0.00–0.07)
BASOS ABS: 0.1 10*3/uL (ref 0.0–0.1)
Basophils Relative: 1 %
Eosinophils Absolute: 0.2 10*3/uL (ref 0.0–0.5)
Eosinophils Relative: 2 %
HCT: 38.7 % — ABNORMAL LOW (ref 39.0–52.0)
Hemoglobin: 13.5 g/dL (ref 13.0–17.0)
IMMATURE GRANULOCYTES: 0 %
LYMPHS ABS: 0.6 10*3/uL — AB (ref 0.7–4.0)
Lymphocytes Relative: 6 %
MCH: 33 pg (ref 26.0–34.0)
MCHC: 34.9 g/dL (ref 30.0–36.0)
MCV: 94.6 fL (ref 80.0–100.0)
MONOS PCT: 9 %
Monocytes Absolute: 0.9 10*3/uL (ref 0.1–1.0)
NRBC: 0 % (ref 0.0–0.2)
Neutro Abs: 9.1 10*3/uL — ABNORMAL HIGH (ref 1.7–7.7)
Neutrophils Relative %: 82 %
Platelets: 232 10*3/uL (ref 150–400)
RBC: 4.09 MIL/uL — ABNORMAL LOW (ref 4.22–5.81)
RDW: 11.8 % (ref 11.5–15.5)
WBC: 10.9 10*3/uL — ABNORMAL HIGH (ref 4.0–10.5)

## 2018-12-13 MED ORDER — SODIUM CHLORIDE 0.9 % IV SOLN
Freq: Once | INTRAVENOUS | Status: AC
  Administered 2018-12-13: 11:00:00 via INTRAVENOUS
  Filled 2018-12-13: qty 250

## 2018-12-13 MED ORDER — DEXAMETHASONE SODIUM PHOSPHATE 10 MG/ML IJ SOLN
10.0000 mg | Freq: Once | INTRAMUSCULAR | Status: AC
  Administered 2018-12-13: 10 mg via INTRAVENOUS
  Filled 2018-12-13: qty 1

## 2018-12-13 MED ORDER — HEPARIN SOD (PORK) LOCK FLUSH 100 UNIT/ML IV SOLN
500.0000 [IU] | Freq: Once | INTRAVENOUS | Status: AC | PRN
  Administered 2018-12-13: 500 [IU]
  Filled 2018-12-13: qty 5

## 2018-12-13 MED ORDER — SODIUM CHLORIDE 0.9 % IV SOLN
1000.0000 mg | Freq: Once | INTRAVENOUS | Status: AC
  Administered 2018-12-13: 1000 mg via INTRAVENOUS
  Filled 2018-12-13: qty 20

## 2018-12-13 MED ORDER — DIPHENHYDRAMINE HCL 50 MG/ML IJ SOLN
25.0000 mg | Freq: Once | INTRAMUSCULAR | Status: AC
  Administered 2018-12-13: 25 mg via INTRAVENOUS
  Filled 2018-12-13: qty 1

## 2018-12-13 NOTE — Progress Notes (Signed)
Patient is here today to follow up on his Non-small cell lung cancer, right and to go over his CT Scan results done yesterday 12/12/2018. Patient stated that he continues to have pain on his bilateral rib cage for the past three weeks. Patient denied fever, chills, nausea, vomiting, SOB, constipation and diarrhea.

## 2018-12-14 ENCOUNTER — Encounter: Payer: Self-pay | Admitting: Urology

## 2018-12-14 ENCOUNTER — Ambulatory Visit: Payer: BLUE CROSS/BLUE SHIELD

## 2018-12-14 ENCOUNTER — Ambulatory Visit (INDEPENDENT_AMBULATORY_CARE_PROVIDER_SITE_OTHER): Payer: BLUE CROSS/BLUE SHIELD | Admitting: Urology

## 2018-12-14 VITALS — BP 139/81 | HR 98 | Ht 72.0 in | Wt 207.0 lb

## 2018-12-14 DIAGNOSIS — E349 Endocrine disorder, unspecified: Secondary | ICD-10-CM | POA: Diagnosis not present

## 2018-12-14 DIAGNOSIS — I7090 Unspecified atherosclerosis: Secondary | ICD-10-CM | POA: Insufficient documentation

## 2018-12-14 DIAGNOSIS — N529 Male erectile dysfunction, unspecified: Secondary | ICD-10-CM

## 2018-12-14 DIAGNOSIS — I700 Atherosclerosis of aorta: Secondary | ICD-10-CM | POA: Insufficient documentation

## 2018-12-14 LAB — THYROID PANEL WITH TSH
FREE THYROXINE INDEX: 2.3 (ref 1.2–4.9)
T3 UPTAKE RATIO: 25 % (ref 24–39)
T4 TOTAL: 9 ug/dL (ref 4.5–12.0)
TSH: 1.71 u[IU]/mL (ref 0.450–4.500)

## 2018-12-14 NOTE — Progress Notes (Signed)
12/14/2018  11:05 AM   [name] [birth year] [id number]  Referring provider: Kirk Ruths, MD [place] [facility] [place], [place] [address]  Chief Complaint  Patient presents with  . Testosterone Deficiency    HPI: Paul Carpenter is a 63 y.o. White or Caucasian male with Stage III lung cancer, testosterone deficiency, erectile dysfunction and BPH with LUTS that presents today to discuss restarting testosterone therapy.  Erectile Dysfunction He complains of erectile problems. He was on chemo for 6 months. He has had 38 rounds of radiation. He believes his cancer treatments have caused him to have low energy levels, decreased libido and erection problems. Dr. Grayland Ormond recommended he restart TRT to help with his symptoms. He feels that his symptoms are secondary to his treatment, but as his symptoms are negatively effecting his relationship with his wife he would like to find a solution. He is no longer experiencing spontaneous erections. He has cut back on his smoking.   Patient reports mild curvature and minimal penile pain.   Testosterone deficiency As above  PMH: Past Medical History:  Diagnosis Date  . Anginal pain ([place])   . Anxiety   . Chest pain   . Chicken pox   . Complication of anesthesia    o2 dropped after neck fusion  . COPD (chronic obstructive pulmonary disease) ([place])   . Coronary artery disease   . Cough    chronic  clear phlegm  . Dysrhythmia    palpitations  . GERD (gastroesophageal reflux disease)    h/o reflux/ hoarsness  . Hematochezia   . Hemorrhoids   . History of chickenpox   . History of colon polyps   . History of Helicobacter pylori infection   . Hoarseness   . Hypertension   . Lung cancer ([place]) 05/2016   Chemo + rad tx's.   . Migraines   . OSA (obstructive sleep apnea)    has CPAP but does not use  . Personal history of tobacco use, presenting hazards to health 03/05/2016  . Pneumonia    5/17  .  Raynaud disease   . Raynaud disease   . Raynaud's disease   . Rotator cuff tear    on right  . Shortness of breath dyspnea   . Sleep apnea   . Ulcer (traumatic) of oral mucosa     Surgical History: Past Surgical History:  Procedure Laterality Date  . BACK SURGERY     cervical fusion x 2  . CARDIAC CATHETERIZATION    . CERVICAL DISCECTOMY     x 2  . COLONOSCOPY    . COLONOSCOPY N/A 07/25/2015   Procedure: COLONOSCOPY;  Surgeon: Lollie Sails, MD;  Location: [facility] ENDOSCOPY;  Service: Endoscopy;  Laterality: N/A;  . COLONOSCOPY WITH PROPOFOL N/A 10/04/2017   Procedure: COLONOSCOPY WITH PROPOFOL;  Surgeon: Lollie Sails, MD;  Location: [facility] ENDOSCOPY;  Service: Endoscopy;  Laterality: N/A;  . ELECTROMAGNETIC NAVIGATION BROCHOSCOPY Left 06/28/2016   Procedure: ELECTROMAGNETIC NAVIGATION BRONCHOSCOPY;  Surgeon: Vilinda Boehringer, MD;  Location: [facility] ORS;  Service: Cardiopulmonary;  Laterality: Left;  . ENDOBRONCHIAL ULTRASOUND N/A 04/11/2018   Procedure: ENDOBRONCHIAL ULTRASOUND;  Surgeon: [name], MD;  Location: [facility] ORS;  Service: Cardiopulmonary;  Laterality: N/A;  . ESOPHAGOGASTRODUODENOSCOPY N/A 07/25/2015   Procedure: ESOPHAGOGASTRODUODENOSCOPY (EGD);  Surgeon: Lollie Sails, MD;  Location: [facility] ENDOSCOPY;  Service: Endoscopy;  Laterality: N/A;  . NASAL SINUS SURGERY     x 2   . PORTA  CATH INSERTION N/A 04/24/2018   Procedure: PORTA CATH INSERTION;  Surgeon: Algernon Huxley, MD;  Location: [place] CV LAB;  Service: Cardiovascular;  Laterality: N/A;  . rotator cuff surgery Right    07/2016  . SEPTOPLASTY    . SKIN GRAFT      Home Medications:  Allergies as of 12/14/2018      Reactions   Lisinopril Rash   Varenicline Rash      Medication List       Accurate as of December 14, 2018 11:05 AM. Always use your most recent med list.        acetaminophen 500 MG tablet Commonly known as:  TYLENOL Take 1,000 mg by mouth every 6 (six) hours as needed (for  pain.).   albuterol 108 (90 Base) MCG/ACT inhaler Commonly known as:  PROAIR HFA Inhale 2 puffs into the lungs every 4 (four) hours as needed for wheezing or shortness of breath.   atorvastatin 40 MG tablet Commonly known as:  LIPITOR Take 40 mg by mouth at bedtime.   busPIRone 15 MG tablet Commonly known as:  BUSPAR Take 15 mg by mouth 2 (two) times daily.   diazepam 5 MG tablet Commonly known as:  VALIUM Take 5 mg by mouth every 12 (twelve) hours as needed for anxiety (SCHEDULED EVERY NIGHT).   DULoxetine 60 MG capsule Commonly known as:  CYMBALTA Take 1 capsule by mouth 1 day or 1 dose.   fluticasone 50 MCG/ACT nasal spray Commonly known as:  FLONASE Place 1 spray into both nostrils daily as needed (for allergies.).   ipratropium-albuterol 0.5-2.5 (3) MG/3ML Soln Commonly known as:  DUONEB Take 3 mLs by nebulization every 6 (six) hours.   lidocaine 2 % solution Commonly known as:  XYLOCAINE   metoprolol tartrate 50 MG tablet Commonly known as:  LOPRESSOR Take 50 mg by mouth 2 (two) times daily.   mirabegron ER 50 MG Tb24 tablet Commonly known as:  MYRBETRIQ Take 1 tablet (50 mg total) by mouth daily.   multivitamin with minerals Tabs tablet Take 1 tablet by mouth daily.   nicotine 21 mg/24hr patch Commonly known as:  NICODERM CQ - dosed in mg/24 hours Place 1 patch (21 mg total) onto the skin daily.   nitroGLYCERIN 0.4 MG SL tablet Commonly known as:  NITROSTAT Place 0.4 mg under the tongue every 5 (five) minutes as needed for chest pain.   ondansetron 8 MG tablet Commonly known as:  ZOFRAN Take 8 mg by mouth every 8 (eight) hours as needed for nausea or vomiting.   OXYGEN Inhale 2 L into the lungs at bedtime.   pantoprazole 40 MG tablet Commonly known as:  PROTONIX Take 40 mg by mouth daily.   potassium chloride 10 MEQ tablet Commonly known as:  K-DUR TAKE 1 TABLET (10 MEQ TOTAL) BY MOUTH ONCE DAILY.   umeclidinium-vilanterol 62.5-25 MCG/INH  Aepb Commonly known as:  ANORO ELLIPTA Inhale 1 puff into the lungs daily.   umeclidinium-vilanterol 62.5-25 MCG/INH Aepb Commonly known as:  ANORO ELLIPTA Inhale 1 puff into the lungs daily.       Allergies:  Allergies  Allergen Reactions  . Lisinopril Rash  . Varenicline Rash    Family History: Family History  Problem Relation Age of Onset  . Heart disease Father   . Prostate cancer Father   . Heart disease Paternal Grandmother   . Heart attack Maternal Grandfather 52  . Kidney cancer Neg Hx   . Bladder Cancer Neg  Hx   . Other Neg Hx        pituitary abnormality    Social History:  reports that he has been smoking cigarettes. He has a 67.50 pack-year smoking history. He has never used smokeless tobacco. He reports current alcohol use of about 2.0 standard drinks of alcohol per week. He reports that he does not use drugs.  ROS: UROLOGY Frequent Urination?: No Hard to postpone urination?: No Burning/pain with urination?: No Get up at night to urinate?: No Leakage of urine?: No Urine stream starts and stops?: No Trouble starting stream?: No Do you have to strain to urinate?: No Blood in urine?: No Urinary tract infection?: No Sexually transmitted disease?: No Injury to kidneys or bladder?: No Painful intercourse?: No Weak stream?: No Erection problems?: Yes Penile pain?: No  Gastrointestinal Nausea?: No Vomiting?: No Indigestion/heartburn?: No Diarrhea?: No Constipation?: No  Constitutional Fever: No Night sweats?: Yes Weight loss?: No Fatigue?: No  Skin Skin rash/lesions?: No Itching?: No  Eyes Blurred vision?: No Double vision?: No  Ears/Nose/Throat Sore throat?: No Sinus problems?: Yes  Hematologic/Lymphatic Swollen glands?: No Easy bruising?: Yes  Cardiovascular Leg swelling?: No Chest pain?: No  Respiratory Cough?: Yes Shortness of breath?: Yes  Endocrine Excessive thirst?: No  Musculoskeletal Back pain?: No Joint  pain?: No  Neurological Headaches?: No Dizziness?: Yes  Psychologic Depression?: No Anxiety?: No  Physical Exam: BP 139/81 (BP Location: Left Arm, Patient Position: Sitting)   Pulse 98   Ht 6' (1.829 m)   Wt 207 lb (93.9 kg)   BMI 28.07 kg/m   Constitutional:  Alert and oriented, No acute distress. Respiratory: Normal respiratory effort, no increased work of breathing. Head: Normocephalic and Atraumatic. GU: No CVA tenderness. Patient with circumcised phallus. 5 x 3 mm plaque near tip of penis. Urethral meatus is patent. No penile discharge. No penile lesions or rashes. Scrotum without lesions, cysts, rashes and/or edema. Testicles are located scrotally bilaterally. No masses are appreciated in the testicles. Left and right epididymis are normal.  Rectal: Normal sphincter tone. Prostate is approximately 45 grams, no nodules. Anus and perineum without scarring or rashes. No rectal masses are appreciated. Skin: No rashes, bruises or suspicious lesions. Seminal vesicles are normal. Neurologic: Grossly intact, no focal deficits, moving all 4 extremities. Psychiatric: Normal mood and affect.  Laboratory Data: Lab Results  Component Value Date   WBC 10.9 (H) 12/13/2018   HGB 13.5 12/13/2018   HCT 38.7 (L) 12/13/2018   MCV 94.6 12/13/2018   PLT 232 12/13/2018   Lab Results  Component Value Date   CREATININE 0.71 12/13/2018   Lab Results  Component Value Date   TESTOSTERONE 442 01/11/2018   Assessment & Plan:    1. Testosterone deficiency  - Patient would like to restart testosterone therapy in hopes of managing side effects of chemotherapy and appease his wife.  - Labs drawn today, will call patient with results (HCT, Hgb, PSA and testosterone level)  - will restart testosterone therapy if appropriate   2. Erectile Dysfunction  - As above  Return for pending labs .  Zara Council, PA-C [facility] [address] [place],  [place] [address] [phone]  I, Temidayo Atanda-Ogunleye , am acting as a scribe for [facility], PA-C  I have reviewed the above documentation for accuracy and completeness, and I agree with the above.    Zara Council, PA-C

## 2018-12-15 LAB — HEMOGLOBIN: Hemoglobin: 13.4 g/dL (ref 13.0–17.7)

## 2018-12-15 LAB — TESTOSTERONE: TESTOSTERONE: 285 ng/dL (ref 264–916)

## 2018-12-15 LAB — HEMATOCRIT: Hematocrit: 37.1 % — ABNORMAL LOW (ref 37.5–51.0)

## 2018-12-15 LAB — PSA: Prostate Specific Ag, Serum: 1 ng/mL (ref 0.0–4.0)

## 2018-12-18 ENCOUNTER — Telehealth: Payer: Self-pay | Admitting: Family Medicine

## 2018-12-18 ENCOUNTER — Other Ambulatory Visit: Payer: Self-pay | Admitting: Family Medicine

## 2018-12-18 DIAGNOSIS — E349 Endocrine disorder, unspecified: Secondary | ICD-10-CM

## 2018-12-18 NOTE — Telephone Encounter (Signed)
-----   Message from Nori Riis, PA-C sent at 12/15/2018  7:46 AM EST ----- Please let Mr. Crass know that his testosterone returned within the normal range.  We need to have him repeat the testosterone blood work before 9am.

## 2018-12-18 NOTE — Telephone Encounter (Signed)
Patient notified and voiced understand appointment scheduled.

## 2018-12-21 ENCOUNTER — Ambulatory Visit
Admission: RE | Admit: 2018-12-21 | Discharge: 2018-12-21 | Disposition: A | Discharge status: Home / Self Care | Payer: BLUE CROSS/BLUE SHIELD | Source: Ambulatory Visit | Attending: Radiation Oncology | Admitting: Radiation Oncology

## 2018-12-21 ENCOUNTER — Other Ambulatory Visit: Payer: BLUE CROSS/BLUE SHIELD

## 2018-12-21 ENCOUNTER — Other Ambulatory Visit: Payer: Self-pay

## 2018-12-21 VITALS — BP 128/76 | HR 86 | Temp 98.0°F | Resp 18 | Wt 211.8 lb

## 2018-12-21 DIAGNOSIS — E349 Endocrine disorder, unspecified: Secondary | ICD-10-CM

## 2018-12-21 DIAGNOSIS — C342 Malignant neoplasm of middle lobe, bronchus or lung: Secondary | ICD-10-CM

## 2018-12-21 NOTE — Progress Notes (Signed)
Radiation Oncology Follow up Note  Name: Paul Carpenter   Date:   12/21/2018 MRN:  [id number] DOB: [birth year]    This 63 y.o. male presents to the clinic today for six-month follow-up status post concurrent chemoradiation for stage IIIB (T1 N3 M0) non-small cell lung cancer.  REFERRING PROVIDER: Kirk Ruths, MD  HPI: patient is a 63 year old male now out 6 months having completed concurrent chemoradiation therapy for stage IIIB non-small cell lung cancer of the right lung. He is currently on.maintenance Imfinzi until September which she is tolerating fairly well. He is quite fatigued somewhat depressed. Weight is stable by mouth intake is good he specifically denies cough hemoptysis or chest tightness.interesting his PET/CT scan from back in November showed hypermetabolic activity in a 2.3 cm apical rightlobe pulmonary nodule suspicious for malignancy he also had hypermetabolic subcarinal lymph nodes.he had a recent CT scan showing bilateral pulmonology nodules in the lung apicesshowing actually some mild decrease in size and hypermetabolic activity. He does have thoracic lymph nodes suspicious for nodal metastasis which was also stable.  COMPLICATIONS OF TREATMENT: none  FOLLOW UP COMPLIANCE: keeps appointments   PHYSICAL EXAM:  BP 128/76 (BP Location: Left Arm, Patient Position: Sitting)   Pulse 86   Temp 98 F (36.7 C) (Tympanic)   Resp 18   Wt 211 lb 12 oz (96.1 kg)   BMI 28.72 kg/m  Well-developed well-nourished patient in NAD. HEENT reveals PERLA, EOMI, discs not visualized.  Oral cavity is clear. No oral mucosal lesions are identified. Neck is clear without evidence of cervical or supraclavicular adenopathy. Lungs are clear to A&P. Cardiac examination is essentially unremarkable with regular rate and rhythm without murmur rub or thrill. Abdomen is benign with no organomegaly or masses noted. Motor sensory and DTR levels are equal and symmetric in the upper and lower  extremities. Cranial nerves II through XII are grossly intact. Proprioception is intact. No peripheral adenopathy or edema is identified. No motor or sensory levels are noted. Crude visual fields are within normal range.  RADIOLOGY RESULTS: CT scan PET CT scans reviewedand compatible with the above-stated findings  PLAN: at the present time he continues on maintenance Imfinzi . I do not see any areas that needradiation therapy at this time as we are keeping an eye on his thoracic nodal metastasis which do not seem to be progressing. Patient is depressed I have suggested exercise physical therapy which may help strengthen him at this time. I have asked to see him back in 6 months for follow-up. Be happy to reevaluate the patient any time should further treatment be indicated.  I would like to take this opportunity to thank you for allowing me to participate in the care of your patient.[name], MD

## 2018-12-22 ENCOUNTER — Telehealth: Payer: Self-pay

## 2018-12-22 LAB — TESTOSTERONE: Testosterone: 359 ng/dL (ref 264–916)

## 2018-12-22 NOTE — Telephone Encounter (Signed)
Called and advised patient of Testosterone level and that his fatigue is not due to testosterone. Advised patient to contact oncologist regarding fatigue per Larene Beach. Patient verbalized understanding.

## 2018-12-22 NOTE — Telephone Encounter (Signed)
-----   Message from Nori Riis, PA-C sent at 12/22/2018  9:28 AM EST ----- Please let Paul Carpenter know that his testosterone has returned again within normal parameters and it is actually higher than the one previously.  He does not have testosterone deficiency at this time.  He should check back with his oncologist regarding his fatigue as he does not have an issue with low testosterone at this time.

## 2018-12-27 ENCOUNTER — Inpatient Hospital Stay: Payer: BLUE CROSS/BLUE SHIELD

## 2018-12-27 VITALS — BP 122/75 | HR 85 | Temp 96.3°F | Resp 18 | Wt 211.0 lb

## 2018-12-27 DIAGNOSIS — C3491 Malignant neoplasm of unspecified part of right bronchus or lung: Secondary | ICD-10-CM

## 2018-12-27 DIAGNOSIS — C342 Malignant neoplasm of middle lobe, bronchus or lung: Secondary | ICD-10-CM | POA: Diagnosis not present

## 2018-12-27 MED ORDER — SODIUM CHLORIDE 0.9 % IV SOLN
1000.0000 mg | Freq: Once | INTRAVENOUS | Status: AC
  Administered 2018-12-27: 1000 mg via INTRAVENOUS
  Filled 2018-12-27: qty 20

## 2018-12-27 MED ORDER — SODIUM CHLORIDE 0.9 % IV SOLN
Freq: Once | INTRAVENOUS | Status: AC
  Administered 2018-12-27: 09:00:00 via INTRAVENOUS
  Filled 2018-12-27: qty 250

## 2018-12-27 MED ORDER — HEPARIN SOD (PORK) LOCK FLUSH 100 UNIT/ML IV SOLN
500.0000 [IU] | Freq: Once | INTRAVENOUS | Status: AC | PRN
  Administered 2018-12-27: 500 [IU]
  Filled 2018-12-27: qty 5

## 2018-12-27 MED ORDER — DIPHENHYDRAMINE HCL 50 MG/ML IJ SOLN
25.0000 mg | Freq: Once | INTRAMUSCULAR | Status: AC
  Administered 2018-12-27: 25 mg via INTRAVENOUS
  Filled 2018-12-27: qty 1

## 2018-12-27 MED ORDER — DEXAMETHASONE SODIUM PHOSPHATE 10 MG/ML IJ SOLN
10.0000 mg | Freq: Once | INTRAMUSCULAR | Status: AC
  Administered 2018-12-27: 10 mg via INTRAVENOUS
  Filled 2018-12-27: qty 1

## 2018-12-27 MED ORDER — SODIUM CHLORIDE 0.9% FLUSH
10.0000 mL | INTRAVENOUS | Status: DC | PRN
  Administered 2018-12-27: 10 mL
  Filled 2018-12-27: qty 10

## 2019-01-07 NOTE — Progress Notes (Signed)
[facility] [facility]  Telephone:([phone] Fax:([phone]) [phone]  ID: Paul Carpenter OB: 10-05-1956  MR#: [id number]  FMB#:[id number]  Patient Care Team: Kirk Ruths, MD as PCP - General (Internal Medicine)  CHIEF COMPLAINT: Stage IIIa non-small cell lung cancer, right middle lobe.  INTERVAL HISTORY: Patient returns to clinic today for further evaluation and consideration of cycle 14 of maintenance durvalumab.  He continues to be highly anxious, but otherwise feels well.  He continues to complain of chronic weakness and fatigue.  He has a chronic cough. He has no neurologic complaints. He denies any fevers.  He does not complain of dysphasia today.  He denies any hemoptysis or shortness of breath.  He denies any nausea, vomiting, constipation, or diarrhea. He has no urinary complaints.  Patient offers no further specific complaints today.  REVIEW OF SYSTEMS:   Review of Systems  Constitutional: Positive for malaise/fatigue. Negative for fever and weight loss.  HENT: Negative.  Negative for congestion and sore throat.   Respiratory: Positive for cough. Negative for hemoptysis and shortness of breath.   Cardiovascular: Negative.  Negative for chest pain and leg swelling.  Gastrointestinal: Negative.  Negative for abdominal pain, blood in stool and melena.  Genitourinary: Negative.  Negative for dysuria.  Musculoskeletal: Negative.  Negative for joint pain.  Skin: Negative.  Negative for rash.  Neurological: Positive for weakness. Negative for sensory change, focal weakness and headaches.  Psychiatric/Behavioral: The patient is nervous/anxious.     As per HPI. Otherwise, a complete review of systems is negative.  PAST MEDICAL HISTORY: Past Medical History:  Diagnosis Date  . Anginal pain ([place])   . Anxiety   . Chest pain   . Chicken pox   . Complication of anesthesia    o2 dropped after neck fusion  . COPD (chronic obstructive pulmonary disease) ([place])   .  Coronary artery disease   . Cough    chronic  clear phlegm  . Dysrhythmia    palpitations  . GERD (gastroesophageal reflux disease)    h/o reflux/ hoarsness  . Hematochezia   . Hemorrhoids   . History of chickenpox   . History of colon polyps   . History of Helicobacter pylori infection   . Hoarseness   . Hypertension   . Lung cancer ([place]) 05/2016   Chemo + rad tx's.   . Migraines   . OSA (obstructive sleep apnea)    has CPAP but does not use  . Personal history of tobacco use, presenting hazards to health 03/05/2016  . Pneumonia    5/17  . Raynaud disease   . Raynaud disease   . Raynaud's disease   . Rotator cuff tear    on right  . Shortness of breath dyspnea   . Sleep apnea   . Ulcer (traumatic) of oral mucosa     PAST SURGICAL HISTORY: Past Surgical History:  Procedure Laterality Date  . BACK SURGERY     cervical fusion x 2  . CARDIAC CATHETERIZATION    . CERVICAL DISCECTOMY     x 2  . COLONOSCOPY    . COLONOSCOPY N/A 07/25/2015   Procedure: COLONOSCOPY;  Surgeon: Lollie Sails, MD;  Location: [facility] ENDOSCOPY;  Service: Endoscopy;  Laterality: N/A;  . COLONOSCOPY WITH PROPOFOL N/A 10/04/2017   Procedure: COLONOSCOPY WITH PROPOFOL;  Surgeon: Lollie Sails, MD;  Location: [facility] ENDOSCOPY;  Service: Endoscopy;  Laterality: N/A;  . ELECTROMAGNETIC NAVIGATION BROCHOSCOPY Left 06/28/2016   Procedure: ELECTROMAGNETIC NAVIGATION BRONCHOSCOPY;  Surgeon: Vilinda Boehringer, MD;  Location: [facility] ORS;  Service: Cardiopulmonary;  Laterality: Left;  . ENDOBRONCHIAL ULTRASOUND N/A 04/11/2018   Procedure: ENDOBRONCHIAL ULTRASOUND;  Surgeon: [name], MD;  Location: [facility] ORS;  Service: Cardiopulmonary;  Laterality: N/A;  . ESOPHAGOGASTRODUODENOSCOPY N/A 07/25/2015   Procedure: ESOPHAGOGASTRODUODENOSCOPY (EGD);  Surgeon: Lollie Sails, MD;  Location: [facility] ENDOSCOPY;  Service: Endoscopy;  Laterality: N/A;  . NASAL SINUS SURGERY     x 2   . PORTA CATH INSERTION N/A 04/24/2018    Procedure: PORTA CATH INSERTION;  Surgeon: Algernon Huxley, MD;  Location: [facility] CV LAB;  Service: Cardiovascular;  Laterality: N/A;  . rotator cuff surgery Right    07/2016  . SEPTOPLASTY    . SKIN GRAFT      FAMILY HISTORY Family History  Problem Relation Age of Onset  . Heart disease Father   . Prostate cancer Father   . Heart disease Paternal Grandmother   . Heart attack Maternal Grandfather 52  . Kidney cancer Neg Hx   . Bladder Cancer Neg Hx   . Other Neg Hx        pituitary abnormality       ADVANCED DIRECTIVES:    HEALTH MAINTENANCE: Social History   Tobacco Use  . Smoking status: Current Every Day Smoker    Packs/day: 1.50    Years: 45.00    Pack years: 67.50    Types: Cigarettes  . Smokeless tobacco: Never Used  Substance Use Topics  . Alcohol use: Yes    Alcohol/week: 2.0 standard drinks    Types: 2 Standard drinks or equivalent per week    Comment: moderate  . Drug use: No     Allergies  Allergen Reactions  . Lisinopril Rash  . Varenicline Rash    Current Outpatient Medications  Medication Sig Dispense Refill  . acetaminophen (TYLENOL) 500 MG tablet Take 1,000 mg by mouth every 6 (six) hours as needed (for pain.).    Marland Kitchen albuterol (PROAIR HFA) 108 (90 Base) MCG/ACT inhaler Inhale 2 puffs into the lungs every 4 (four) hours as needed for wheezing or shortness of breath. 1 Inhaler 1  . atorvastatin (LIPITOR) 40 MG tablet Take 40 mg by mouth at bedtime.     . busPIRone (BUSPAR) 15 MG tablet Take 15 mg by mouth 2 (two) times daily.   10  . diazepam (VALIUM) 5 MG tablet Take 5 mg by mouth every 12 (twelve) hours as needed for anxiety (SCHEDULED EVERY NIGHT).     . DULoxetine (CYMBALTA) 60 MG capsule Take 1 capsule by mouth 1 day or 1 dose.    . fluticasone (FLONASE) 50 MCG/ACT nasal spray Place 1 spray into both nostrils daily as needed (for allergies.).     Marland Kitchen ipratropium-albuterol (DUONEB) 0.5-2.5 (3) MG/3ML SOLN Take 3 mLs by nebulization every 6  (six) hours. 360 mL 5  . lidocaine (XYLOCAINE) 2 % solution   2  . metoprolol tartrate (LOPRESSOR) 50 MG tablet Take 50 mg by mouth 2 (two) times daily.    . mirabegron ER (MYRBETRIQ) 50 MG TB24 tablet Take 1 tablet (50 mg total) by mouth daily. 90 tablet 3  . Multiple Vitamin (MULTIVITAMIN WITH MINERALS) TABS tablet Take 1 tablet by mouth daily.     . nicotine (NICODERM CQ - DOSED IN MG/24 HOURS) 21 mg/24hr patch Place 1 patch (21 mg total) onto the skin daily. 30 patch 12  . nitroGLYCERIN (NITROSTAT) 0.4 MG SL tablet Place 0.4 mg under the  tongue every 5 (five) minutes as needed for chest pain.     Marland Kitchen ondansetron (ZOFRAN) 8 MG tablet Take 8 mg by mouth every 8 (eight) hours as needed for nausea or vomiting.    . OXYGEN Inhale 2 L into the lungs at bedtime.     . pantoprazole (PROTONIX) 40 MG tablet Take 40 mg by mouth daily.  3  . potassium chloride (K-DUR) 10 MEQ tablet TAKE 1 TABLET (10 MEQ TOTAL) BY MOUTH ONCE DAILY.  3  . umeclidinium-vilanterol (ANORO ELLIPTA) 62.5-25 MCG/INH AEPB Inhale 1 puff into the lungs daily. 1 each 10  . umeclidinium-vilanterol (ANORO ELLIPTA) 62.5-25 MCG/INH AEPB Inhale 1 puff into the lungs daily. 1 each 0   No current facility-administered medications for this visit.    Facility-Administered Medications Ordered in Other Visits  Medication Dose Route Frequency Provider Last Rate Last Dose  . heparin lock flush 100 unit/mL  500 Units Intravenous Once [name], MD        OBJECTIVE: Vitals:   01/10/19 0918 01/10/19 0923  BP:  116/77  Pulse:  87  Resp: 16   Temp:  (!) 97.2 F (36.2 C)     Body mass index is 28.67 kg/m.    ECOG FS:0 - Asymptomatic  General: Well-developed, well-nourished, no acute distress. Eyes: Pink conjunctiva, anicteric sclera. HEENT: Normocephalic, moist mucous membranes. Lungs: Clear to auscultation bilaterally. Heart: Regular rate and rhythm. No rubs, murmurs, or gallops. Abdomen: Soft, nontender, nondistended. No  organomegaly noted, normoactive bowel sounds. Musculoskeletal: No edema, cyanosis, or clubbing. Neuro: Alert, answering all questions appropriately. Cranial nerves grossly intact. Skin: No rashes or petechiae noted. Psych: Normal affect.  LAB RESULTS:  Lab Results  Component Value Date   NA 129 (L) 01/10/2019   K 3.9 01/10/2019   CL 97 (L) 01/10/2019   CO2 26 01/10/2019   GLUCOSE 138 (H) 01/10/2019   BUN 12 01/10/2019   CREATININE 0.84 01/10/2019   CALCIUM 8.7 (L) 01/10/2019   PROT 6.9 01/10/2019   ALBUMIN 3.4 (L) 01/10/2019   AST 19 01/10/2019   ALT 18 01/10/2019   ALKPHOS 102 01/10/2019   BILITOT 0.5 01/10/2019   GFRNONAA >60 01/10/2019   GFRAA >60 01/10/2019    Lab Results  Component Value Date   WBC 11.4 (H) 01/10/2019   NEUTROABS 9.8 (H) 01/10/2019   HGB 12.3 (L) 01/10/2019   HCT 35.3 (L) 01/10/2019   MCV 94.6 01/10/2019   PLT 256 01/10/2019     STUDIES:  ASSESSMENT: Stage IIIa non-small cell lung cancer, right middle lobe.  PLAN:    1. Stage IIIa non-small cell lung cancer, right middle lobe: Case discussed with pathologist and unable to determine whether this is adenocarcinoma or squamous carcinoma.  There is also insufficient tissue to do further testing.  Liquid biopsy did not reveal any actionable mutations.  CT scan results from December 12, 2018 reviewed independently revealing stable versus mildly decreased pulmonary nodules. MRI of the brain completed on Mar 28, 2018 reviewed independently did not reveal metastatic disease.  Patient completed XRT June 26, 2018.  He completed his concurrent single agent carboplatinum on June 21, 2018.  Patient had a reaction to Taxol during cycle 1 and this was discontinued.  Patient initiated maintenance durvalumab on July 12, 2018.  Proceed with cycle 14 of maintenance durvalumab today.  Return to clinic in 2 weeks for treatment only and then in 4 weeks for further evaluation and consideration of cycle 16. 2.   Secondary  polycythemia: Resolved.  Likely secondary to heavy tobacco use.  Previously, the remainder of his laboratory work, including JAK-2 mutation, was either negative or within normal limits.  3.  Right upper lobe pulmonary nodule: CT scan results as above.  Proceed with treatment as planned.  4.  Colon polyps:  Patient has a personal history of greater then 10 adenomatous polyps on his most recent conoloscopy. He does not know of any family history of increased polyps or colon cancer.  Genetic testing to assess for the APC mutation for FAP or AFAP was negative. Continue colonoscopies as per GI. 5. Tobacco Use: Patient continues to heavily smoke.  He expressed understanding by continuing tobacco use increases his chance of recurrence. 6. Anxiety: Chronic.  Continue IV Benadryl with each treatment.  Continue treatment and evaluation per primary care. 7.  Hyponatremia: Chronic and unchanged.  Sodium level is 129 today.  Monitor.  I spent a total of 30 minutes face-to-face with the patient of which greater than 50% of the visit was spent in counseling and coordination of care as detailed above.   Patient expressed understanding and was in agreement with this plan. He also understands that He can call clinic at any time with any questions, concerns, or complaints.    [name], MD   01/12/2019 10:11 AM

## 2019-01-10 ENCOUNTER — Encounter: Payer: Self-pay | Admitting: Oncology

## 2019-01-10 ENCOUNTER — Inpatient Hospital Stay: Payer: BLUE CROSS/BLUE SHIELD | Admitting: Oncology

## 2019-01-10 ENCOUNTER — Other Ambulatory Visit: Payer: Self-pay

## 2019-01-10 ENCOUNTER — Inpatient Hospital Stay: Payer: BLUE CROSS/BLUE SHIELD | Attending: Oncology

## 2019-01-10 ENCOUNTER — Inpatient Hospital Stay: Payer: BLUE CROSS/BLUE SHIELD

## 2019-01-10 VITALS — BP 116/77 | HR 87 | Temp 97.2°F | Resp 16 | Ht 72.0 in | Wt 211.4 lb

## 2019-01-10 DIAGNOSIS — I7300 Raynaud's syndrome without gangrene: Secondary | ICD-10-CM | POA: Insufficient documentation

## 2019-01-10 DIAGNOSIS — Z79899 Other long term (current) drug therapy: Secondary | ICD-10-CM | POA: Diagnosis not present

## 2019-01-10 DIAGNOSIS — Z5112 Encounter for antineoplastic immunotherapy: Secondary | ICD-10-CM | POA: Diagnosis present

## 2019-01-10 DIAGNOSIS — F17210 Nicotine dependence, cigarettes, uncomplicated: Secondary | ICD-10-CM | POA: Insufficient documentation

## 2019-01-10 DIAGNOSIS — I10 Essential (primary) hypertension: Secondary | ICD-10-CM | POA: Diagnosis not present

## 2019-01-10 DIAGNOSIS — E871 Hypo-osmolality and hyponatremia: Secondary | ICD-10-CM | POA: Diagnosis not present

## 2019-01-10 DIAGNOSIS — I2510 Atherosclerotic heart disease of native coronary artery without angina pectoris: Secondary | ICD-10-CM | POA: Insufficient documentation

## 2019-01-10 DIAGNOSIS — G4733 Obstructive sleep apnea (adult) (pediatric): Secondary | ICD-10-CM | POA: Insufficient documentation

## 2019-01-10 DIAGNOSIS — C3491 Malignant neoplasm of unspecified part of right bronchus or lung: Secondary | ICD-10-CM

## 2019-01-10 DIAGNOSIS — R002 Palpitations: Secondary | ICD-10-CM | POA: Diagnosis not present

## 2019-01-10 DIAGNOSIS — K219 Gastro-esophageal reflux disease without esophagitis: Secondary | ICD-10-CM | POA: Insufficient documentation

## 2019-01-10 DIAGNOSIS — Z923 Personal history of irradiation: Secondary | ICD-10-CM | POA: Insufficient documentation

## 2019-01-10 DIAGNOSIS — J449 Chronic obstructive pulmonary disease, unspecified: Secondary | ICD-10-CM | POA: Insufficient documentation

## 2019-01-10 LAB — COMPREHENSIVE METABOLIC PANEL
ALK PHOS: 102 U/L (ref 38–126)
ALT: 18 U/L (ref 0–44)
AST: 19 U/L (ref 15–41)
Albumin: 3.4 g/dL — ABNORMAL LOW (ref 3.5–5.0)
Anion gap: 6 (ref 5–15)
BUN: 12 mg/dL (ref 8–23)
CO2: 26 mmol/L (ref 22–32)
Calcium: 8.7 mg/dL — ABNORMAL LOW (ref 8.9–10.3)
Chloride: 97 mmol/L — ABNORMAL LOW (ref 98–111)
Creatinine, Ser: 0.84 mg/dL (ref 0.61–1.24)
GFR calc Af Amer: >60 mL/min (ref >60)
GFR calc non Af Amer: >60 mL/min (ref >60)
Glucose, Bld: 138 mg/dL — ABNORMAL HIGH (ref 70–99)
Potassium: 3.9 mmol/L (ref 3.5–5.1)
Sodium: 129 mmol/L — ABNORMAL LOW (ref 135–145)
Total Bilirubin: 0.5 mg/dL (ref 0.3–1.2)
Total Protein: 6.9 g/dL (ref 6.5–8.1)

## 2019-01-10 LAB — CBC WITH DIFFERENTIAL/PLATELET
ABS IMMATURE GRANULOCYTES: 0.06 10*3/uL (ref 0.00–0.07)
Basophils Absolute: 0.1 10*3/uL (ref 0.0–0.1)
Basophils Relative: 1 %
Eosinophils Absolute: 0.3 10*3/uL (ref 0.0–0.5)
Eosinophils Relative: 3 %
HCT: 35.3 % — ABNORMAL LOW (ref 39.0–52.0)
Hemoglobin: 12.3 g/dL — ABNORMAL LOW (ref 13.0–17.0)
Immature Granulocytes: 1 %
Lymphocytes Relative: 5 %
Lymphs Abs: 0.5 10*3/uL — ABNORMAL LOW (ref 0.7–4.0)
MCH: 33 pg (ref 26.0–34.0)
MCHC: 34.8 g/dL (ref 30.0–36.0)
MCV: 94.6 fL (ref 80.0–100.0)
Monocytes Absolute: 0.7 10*3/uL (ref 0.1–1.0)
Monocytes Relative: 6 %
NEUTROS ABS: 9.8 10*3/uL — AB (ref 1.7–7.7)
Neutrophils Relative %: 84 %
PLATELETS: 256 10*3/uL (ref 150–400)
RBC: 3.73 MIL/uL — ABNORMAL LOW (ref 4.22–5.81)
RDW: 11.9 % (ref 11.5–15.5)
WBC: 11.4 10*3/uL — ABNORMAL HIGH (ref 4.0–10.5)
nRBC: 0 % (ref 0.0–0.2)

## 2019-01-10 MED ORDER — SODIUM CHLORIDE 0.9 % IV SOLN
Freq: Once | INTRAVENOUS | Status: AC
  Administered 2019-01-10: 10:00:00 via INTRAVENOUS
  Filled 2019-01-10: qty 250

## 2019-01-10 MED ORDER — HEPARIN SOD (PORK) LOCK FLUSH 100 UNIT/ML IV SOLN
500.0000 [IU] | Freq: Once | INTRAVENOUS | Status: AC | PRN
  Administered 2019-01-10: 500 [IU]
  Filled 2019-01-10: qty 5

## 2019-01-10 MED ORDER — DEXAMETHASONE SODIUM PHOSPHATE 10 MG/ML IJ SOLN
10.0000 mg | Freq: Once | INTRAMUSCULAR | Status: AC
  Administered 2019-01-10: 10 mg via INTRAVENOUS
  Filled 2019-01-10: qty 1

## 2019-01-10 MED ORDER — SODIUM CHLORIDE 0.9 % IV SOLN
1000.0000 mg | Freq: Once | INTRAVENOUS | Status: AC
  Administered 2019-01-10: 1000 mg via INTRAVENOUS
  Filled 2019-01-10: qty 20

## 2019-01-10 MED ORDER — DIPHENHYDRAMINE HCL 50 MG/ML IJ SOLN
25.0000 mg | Freq: Once | INTRAMUSCULAR | Status: AC
  Administered 2019-01-10: 25 mg via INTRAVENOUS
  Filled 2019-01-10: qty 1

## 2019-01-10 NOTE — Progress Notes (Signed)
Patient here for pre treatment he is feeling more tired.

## 2019-01-23 ENCOUNTER — Other Ambulatory Visit: Payer: Self-pay

## 2019-01-24 ENCOUNTER — Inpatient Hospital Stay: Payer: BLUE CROSS/BLUE SHIELD

## 2019-01-24 ENCOUNTER — Other Ambulatory Visit: Payer: Self-pay

## 2019-01-24 VITALS — BP 136/77 | HR 81 | Temp 97.7°F | Resp 20 | Wt 210.0 lb

## 2019-01-24 DIAGNOSIS — C3491 Malignant neoplasm of unspecified part of right bronchus or lung: Secondary | ICD-10-CM

## 2019-01-24 DIAGNOSIS — Z5112 Encounter for antineoplastic immunotherapy: Secondary | ICD-10-CM | POA: Diagnosis not present

## 2019-01-24 MED ORDER — HEPARIN SOD (PORK) LOCK FLUSH 100 UNIT/ML IV SOLN
500.0000 [IU] | Freq: Once | INTRAVENOUS | Status: AC | PRN
  Administered 2019-01-24: 500 [IU]
  Filled 2019-01-24 (×2): qty 5

## 2019-01-24 MED ORDER — SODIUM CHLORIDE 0.9 % IV SOLN
Freq: Once | INTRAVENOUS | Status: AC
  Administered 2019-01-24: 14:00:00 via INTRAVENOUS
  Filled 2019-01-24: qty 250

## 2019-01-24 MED ORDER — DEXAMETHASONE SODIUM PHOSPHATE 10 MG/ML IJ SOLN
10.0000 mg | Freq: Once | INTRAMUSCULAR | Status: AC
  Administered 2019-01-24: 10 mg via INTRAVENOUS
  Filled 2019-01-24: qty 1

## 2019-01-24 MED ORDER — DIPHENHYDRAMINE HCL 50 MG/ML IJ SOLN
25.0000 mg | Freq: Once | INTRAMUSCULAR | Status: AC
  Administered 2019-01-24: 25 mg via INTRAVENOUS
  Filled 2019-01-24: qty 1

## 2019-01-24 MED ORDER — SODIUM CHLORIDE 0.9% FLUSH
10.0000 mL | INTRAVENOUS | Status: DC | PRN
  Administered 2019-01-24: 10 mL
  Filled 2019-01-24: qty 10

## 2019-01-24 MED ORDER — SODIUM CHLORIDE 0.9 % IV SOLN
1000.0000 mg | Freq: Once | INTRAVENOUS | Status: AC
  Administered 2019-01-24: 1000 mg via INTRAVENOUS
  Filled 2019-01-24: qty 20

## 2019-02-05 ENCOUNTER — Telehealth: Payer: Self-pay | Admitting: *Deleted

## 2019-02-05 NOTE — Telephone Encounter (Signed)
Patient coughing and wheezing again Afibrile. He started yesterday more like asthma he gets. Asking if physician wants to order antibiotics for him. He forgot to take benadryl yesterday and was outside a lot Sat and yesterday. Please advise

## 2019-02-05 NOTE — Telephone Encounter (Signed)
Needs to go to PCP or urgent care.

## 2019-02-05 NOTE — Telephone Encounter (Signed)
Wife informed of doctor response and she said she will call his PCP

## 2019-02-07 ENCOUNTER — Inpatient Hospital Stay: Payer: BLUE CROSS/BLUE SHIELD | Attending: Oncology | Admitting: *Deleted

## 2019-02-07 ENCOUNTER — Encounter: Payer: Self-pay | Admitting: Oncology

## 2019-02-07 ENCOUNTER — Inpatient Hospital Stay: Payer: BLUE CROSS/BLUE SHIELD

## 2019-02-07 ENCOUNTER — Inpatient Hospital Stay (HOSPITAL_BASED_OUTPATIENT_CLINIC_OR_DEPARTMENT_OTHER): Payer: BLUE CROSS/BLUE SHIELD | Admitting: Oncology

## 2019-02-07 ENCOUNTER — Other Ambulatory Visit: Payer: Self-pay

## 2019-02-07 VITALS — BP 118/78 | HR 70 | Temp 97.8°F | Ht 72.0 in | Wt 209.9 lb

## 2019-02-07 DIAGNOSIS — I2510 Atherosclerotic heart disease of native coronary artery without angina pectoris: Secondary | ICD-10-CM | POA: Diagnosis not present

## 2019-02-07 DIAGNOSIS — Z923 Personal history of irradiation: Secondary | ICD-10-CM | POA: Insufficient documentation

## 2019-02-07 DIAGNOSIS — R05 Cough: Secondary | ICD-10-CM | POA: Diagnosis not present

## 2019-02-07 DIAGNOSIS — F17210 Nicotine dependence, cigarettes, uncomplicated: Secondary | ICD-10-CM | POA: Diagnosis not present

## 2019-02-07 DIAGNOSIS — E871 Hypo-osmolality and hyponatremia: Secondary | ICD-10-CM | POA: Insufficient documentation

## 2019-02-07 DIAGNOSIS — Z9221 Personal history of antineoplastic chemotherapy: Secondary | ICD-10-CM

## 2019-02-07 DIAGNOSIS — C342 Malignant neoplasm of middle lobe, bronchus or lung: Secondary | ICD-10-CM | POA: Diagnosis present

## 2019-02-07 DIAGNOSIS — I10 Essential (primary) hypertension: Secondary | ICD-10-CM | POA: Diagnosis not present

## 2019-02-07 DIAGNOSIS — J449 Chronic obstructive pulmonary disease, unspecified: Secondary | ICD-10-CM | POA: Diagnosis not present

## 2019-02-07 DIAGNOSIS — C3491 Malignant neoplasm of unspecified part of right bronchus or lung: Secondary | ICD-10-CM

## 2019-02-07 DIAGNOSIS — G4733 Obstructive sleep apnea (adult) (pediatric): Secondary | ICD-10-CM | POA: Diagnosis not present

## 2019-02-07 DIAGNOSIS — Z5112 Encounter for antineoplastic immunotherapy: Secondary | ICD-10-CM | POA: Insufficient documentation

## 2019-02-07 DIAGNOSIS — Z79899 Other long term (current) drug therapy: Secondary | ICD-10-CM

## 2019-02-07 DIAGNOSIS — I7300 Raynaud's syndrome without gangrene: Secondary | ICD-10-CM | POA: Diagnosis not present

## 2019-02-07 DIAGNOSIS — R5383 Other fatigue: Secondary | ICD-10-CM | POA: Insufficient documentation

## 2019-02-07 DIAGNOSIS — Z9981 Dependence on supplemental oxygen: Secondary | ICD-10-CM

## 2019-02-07 DIAGNOSIS — Z86010 Personal history of colonic polyps: Secondary | ICD-10-CM | POA: Insufficient documentation

## 2019-02-07 DIAGNOSIS — R5381 Other malaise: Secondary | ICD-10-CM | POA: Diagnosis not present

## 2019-02-07 DIAGNOSIS — F419 Anxiety disorder, unspecified: Secondary | ICD-10-CM | POA: Diagnosis not present

## 2019-02-07 DIAGNOSIS — K219 Gastro-esophageal reflux disease without esophagitis: Secondary | ICD-10-CM | POA: Insufficient documentation

## 2019-02-07 DIAGNOSIS — Z95828 Presence of other vascular implants and grafts: Secondary | ICD-10-CM

## 2019-02-07 LAB — CBC WITH DIFFERENTIAL/PLATELET
Abs Immature Granulocytes: 0.02 K/uL (ref 0.00–0.07)
Basophils Absolute: 0.1 K/uL (ref 0.0–0.1)
Basophils Relative: 1 %
Eosinophils Absolute: 0.2 K/uL (ref 0.0–0.5)
Eosinophils Relative: 3 %
HCT: 38.9 % — ABNORMAL LOW (ref 39.0–52.0)
Hemoglobin: 13.4 g/dL (ref 13.0–17.0)
Immature Granulocytes: 0 %
Lymphocytes Relative: 9 %
Lymphs Abs: 0.7 K/uL (ref 0.7–4.0)
MCH: 33.2 pg (ref 26.0–34.0)
MCHC: 34.4 g/dL (ref 30.0–36.0)
MCV: 96.3 fL (ref 80.0–100.0)
Monocytes Absolute: 0.6 K/uL (ref 0.1–1.0)
Monocytes Relative: 8 %
Neutro Abs: 5.9 K/uL (ref 1.7–7.7)
Neutrophils Relative %: 79 %
Platelets: 182 K/uL (ref 150–400)
RBC: 4.04 MIL/uL — ABNORMAL LOW (ref 4.22–5.81)
RDW: 13 % (ref 11.5–15.5)
WBC: 7.4 K/uL (ref 4.0–10.5)
nRBC: 0 % (ref 0.0–0.2)

## 2019-02-07 LAB — COMPREHENSIVE METABOLIC PANEL WITH GFR
ALT: 24 U/L (ref 0–44)
AST: 27 U/L (ref 15–41)
Albumin: 3.9 g/dL (ref 3.5–5.0)
Alkaline Phosphatase: 84 U/L (ref 38–126)
Anion gap: 6 (ref 5–15)
BUN: 13 mg/dL (ref 8–23)
CO2: 27 mmol/L (ref 22–32)
Calcium: 8.8 mg/dL — ABNORMAL LOW (ref 8.9–10.3)
Chloride: 100 mmol/L (ref 98–111)
Creatinine, Ser: 0.73 mg/dL (ref 0.61–1.24)
GFR calc Af Amer: >60 mL/min
GFR calc non Af Amer: >60 mL/min
Glucose, Bld: 110 mg/dL — ABNORMAL HIGH (ref 70–99)
Potassium: 4 mmol/L (ref 3.5–5.1)
Sodium: 133 mmol/L — ABNORMAL LOW (ref 135–145)
Total Bilirubin: 0.6 mg/dL (ref 0.3–1.2)
Total Protein: 6.9 g/dL (ref 6.5–8.1)

## 2019-02-07 MED ORDER — SODIUM CHLORIDE 0.9 % IV SOLN
1000.0000 mg | Freq: Once | INTRAVENOUS | Status: AC
  Administered 2019-02-07: 1000 mg via INTRAVENOUS
  Filled 2019-02-07: qty 20

## 2019-02-07 MED ORDER — DIPHENHYDRAMINE HCL 50 MG/ML IJ SOLN
25.0000 mg | Freq: Once | INTRAMUSCULAR | Status: AC
  Administered 2019-02-07: 25 mg via INTRAVENOUS
  Filled 2019-02-07: qty 1

## 2019-02-07 MED ORDER — SODIUM CHLORIDE 0.9% FLUSH
10.0000 mL | Freq: Once | INTRAVENOUS | Status: AC
  Administered 2019-02-07: 11:00:00 10 mL via INTRAVENOUS
  Filled 2019-02-07: qty 10

## 2019-02-07 MED ORDER — SODIUM CHLORIDE 0.9 % IV SOLN
Freq: Once | INTRAVENOUS | Status: AC
  Administered 2019-02-07: 12:00:00 via INTRAVENOUS
  Filled 2019-02-07: qty 250

## 2019-02-07 MED ORDER — HEPARIN SOD (PORK) LOCK FLUSH 100 UNIT/ML IV SOLN
500.0000 [IU] | Freq: Once | INTRAVENOUS | Status: AC | PRN
  Administered 2019-02-07: 500 [IU]
  Filled 2019-02-07: qty 5

## 2019-02-07 MED ORDER — DEXAMETHASONE SODIUM PHOSPHATE 10 MG/ML IJ SOLN
10.0000 mg | Freq: Once | INTRAMUSCULAR | Status: AC
  Administered 2019-02-07: 10 mg via INTRAVENOUS
  Filled 2019-02-07: qty 1

## 2019-02-07 NOTE — Progress Notes (Signed)
[place]  [phone]) [phone] Fax:(336) [phone]  ID: [name] OB: 1956/05/08  MR#: [id number]  VPX#:[id number]  Patient Care Team: Kirk Ruths, MD as PCP - General (Internal Medicine)  CHIEF COMPLAINT: Stage IIIa non-small cell lung cancer, right middle lobe.  INTERVAL HISTORY: Patient returns to clinic today for further evaluation and consideration of cycle 16 of maintenance durvalumab.  He continues to be highly anxious, but otherwise feels well.  He is tolerating his treatments without significant side effects.  He continues to complain of chronic weakness and fatigue.  He has a chronic cough that is unchanged. He has no neurologic complaints. He denies any fevers.  He does not complain of dysphasia today.  He denies any hemoptysis or shortness of breath.  He denies any nausea, vomiting, constipation, or diarrhea. He has no urinary complaints.  Patient offers no further specific complaints today.  REVIEW OF SYSTEMS:   Review of Systems  Constitutional: Positive for malaise/fatigue. Negative for fever and weight loss.  HENT: Negative.  Negative for congestion and sore throat.   Respiratory: Positive for cough. Negative for hemoptysis and shortness of breath.   Cardiovascular: Negative.  Negative for chest pain and leg swelling.  Gastrointestinal: Negative.  Negative for abdominal pain, blood in stool and melena.  Genitourinary: Negative.  Negative for dysuria.  Musculoskeletal: Negative.  Negative for joint pain.  Skin: Negative.  Negative for rash.  Neurological: Positive for weakness. Negative for sensory change, focal weakness and headaches.  Psychiatric/Behavioral: The patient is nervous/anxious.     As per HPI. Otherwise, a complete review of systems is negative.  PAST MEDICAL HISTORY: Past Medical History:  Diagnosis Date   Anginal pain (HCC)    Anxiety    Chest pain    Chicken pox    Complication of anesthesia    o2  dropped after neck fusion   COPD (chronic obstructive pulmonary disease) (HCC)    Coronary artery disease    Cough    chronic  clear phlegm   Dysrhythmia    palpitations   GERD (gastroesophageal reflux disease)    h/o reflux/ hoarsness   Hematochezia    Hemorrhoids    History of chickenpox    History of colon polyps    History of Helicobacter pylori infection    Hoarseness    Hypertension    Lung cancer ([place]) 05/2016   Chemo + rad tx's.    Migraines    OSA (obstructive sleep apnea)    has CPAP but does not use   Personal history of tobacco use, presenting hazards to health 03/05/2016   Pneumonia    5/17   Raynaud disease    Raynaud disease    Raynaud's disease    Rotator cuff tear    on right   Shortness of breath dyspnea    Sleep apnea    Ulcer (traumatic) of oral mucosa     PAST SURGICAL HISTORY: Past Surgical History:  Procedure Laterality Date   BACK SURGERY     cervical fusion x 2   CARDIAC CATHETERIZATION     CERVICAL DISCECTOMY     x 2   COLONOSCOPY     COLONOSCOPY N/A 07/25/2015   Procedure: COLONOSCOPY;  Surgeon: Lollie Sails, MD;  Location[facility] ENDOSCOPY;  Service: Endoscopy;  Laterality: N/A;   COLONOSCOPY WITH PROPOFOL N/A 10/04/2017   Procedure: COLONOSCOPY WITH PROPOFOL;  Surgeon: Lollie Sails, MD;  Location: [facility] ENDOSCOPY;  Service: Endoscopy;  Laterality: N/A;  ELECTROMAGNETIC NAVIGATION BROCHOSCOPY Left 06/28/2016   Procedure: ELECTROMAGNETIC NAVIGATION BRONCHOSCOPY;  Surgeon: Vilinda Boehringer, MD;  Location: [facility] ORS;  Service: Cardiopulmonary;  Laterality: Left;   ENDOBRONCHIAL ULTRASOUND N/A 04/11/2018   Procedure: ENDOBRONCHIAL ULTRASOUND;  Surgeon: [name], MD;  Location: [facility] ORS;  Service: Cardiopulmonary;  Laterality: N/A;   ESOPHAGOGASTRODUODENOSCOPY N/A 07/25/2015   Procedure: ESOPHAGOGASTRODUODENOSCOPY (EGD);  Surgeon: Lollie Sails, MD;  Location: [facility] ENDOSCOPY;  Service: Endoscopy;   Laterality: N/A;   NASAL SINUS SURGERY     x 2    PORTA CATH INSERTION N/A 04/24/2018   Procedure: PORTA CATH INSERTION;  Surgeon: Algernon Huxley, MD;  Location: [place] [facility];  Service: Cardiovascular;  Laterality: N/A;   rotator cuff surgery Right    07/2016   SEPTOPLASTY     SKIN GRAFT      FAMILY HISTORY Family History  Problem Relation Age of Onset   Heart disease Father    Prostate cancer Father    Heart disease Paternal Grandmother    Heart attack Maternal Grandfather 68   Kidney cancer Neg Hx    Bladder Cancer Neg Hx    Other Neg Hx        pituitary abnormality       ADVANCED DIRECTIVES:    HEALTH MAINTENANCE: Social History   Tobacco Use   Smoking status: Current Every Day Smoker    Packs/day: 1.50    Years: 45.00    Pack years: 67.50    Types: Cigarettes   Smokeless tobacco: Never Used  Substance Use Topics   Alcohol use: Yes    Alcohol/week: 2.0 standard drinks    Types: 2 Standard drinks or equivalent per week    Comment: moderate   Drug use: No     Allergies  Allergen Reactions   Lisinopril Rash   Varenicline Rash    Current Outpatient Medications  Medication Sig Dispense Refill   acetaminophen (TYLENOL) 500 MG tablet Take 1,000 mg by mouth every 6 (six) hours as needed (for pain.).     albuterol (PROAIR HFA) 108 (90 Base) MCG/ACT inhaler Inhale 2 puffs into the lungs every 4 (four) hours as needed for wheezing or shortness of breath. 1 Inhaler 1   atorvastatin (LIPITOR) 40 MG tablet Take 40 mg by mouth at bedtime.      busPIRone (BUSPAR) 15 MG tablet Take 15 mg by mouth 2 (two) times daily.   10   diazepam (VALIUM) 5 MG tablet Take 5 mg by mouth every 12 (twelve) hours as needed for anxiety (SCHEDULED EVERY NIGHT).      DULoxetine (CYMBALTA) 60 MG capsule Take 1 capsule by mouth 1 day or 1 dose.     fluticasone (FLONASE) 50 MCG/ACT nasal spray Place 1 spray into both nostrils daily as needed (for allergies.).        ipratropium-albuterol (DUONEB) 0.5-2.5 (3) MG/3ML SOLN Take 3 mLs by nebulization every 6 (six) hours. 360 mL 5   lidocaine (XYLOCAINE) 2 % solution   2   metoprolol tartrate (LOPRESSOR) 50 MG tablet Take 50 mg by mouth 2 (two) times daily.     mirabegron ER (MYRBETRIQ) 50 MG TB24 tablet Take 1 tablet (50 mg total) by mouth daily. 90 tablet 3   Multiple Vitamin (MULTIVITAMIN WITH MINERALS) TABS tablet Take 1 tablet by mouth daily.      nicotine (NICODERM CQ - DOSED IN MG/24 HOURS) 21 mg/24hr patch Place 1 patch (21 mg total) onto the skin daily. 30 patch 12  ondansetron (ZOFRAN) 8 MG tablet Take 8 mg by mouth every 8 (eight) hours as needed for nausea or vomiting.     OXYGEN Inhale 2 L into the lungs at bedtime.      pantoprazole (PROTONIX) 40 MG tablet Take 40 mg by mouth daily.  3   potassium chloride (K-DUR) 10 MEQ tablet TAKE 1 TABLET (10 MEQ TOTAL) BY MOUTH ONCE DAILY.  3   umeclidinium-vilanterol (ANORO ELLIPTA) 62.5-25 MCG/INH AEPB Inhale 1 puff into the lungs daily. 1 each 10   umeclidinium-vilanterol (ANORO ELLIPTA) 62.5-25 MCG/INH AEPB Inhale 1 puff into the lungs daily. 1 each 0   nitroGLYCERIN (NITROSTAT) 0.4 MG SL tablet Place 0.4 mg under the tongue every 5 (five) minutes as needed for chest pain.      No current facility-administered medications for this visit.    Facility-Administered Medications Ordered in Other Visits  Medication Dose Route Frequency Provider Last Rate Last Dose   heparin lock flush 100 unit/mL  500 Units Intravenous Once [name], MD        OBJECTIVE: Vitals:   02/07/19 1101 02/07/19 1105  BP: 118/78   Pulse: 70   Temp: 97.8 F (36.6 C)   SpO2:  98%     Body mass index is 28.47 kg/m.    ECOG FS:0 - Asymptomatic  General: Well-developed, well-nourished, no acute distress. Eyes: Pink conjunctiva, anicteric sclera. HEENT: Normocephalic, moist mucous membranes. Lungs: Clear to auscultation bilaterally. Heart: Regular  rate and rhythm. No rubs, murmurs, or gallops. Abdomen: Soft, nontender, nondistended. No organomegaly noted, normoactive bowel sounds. Musculoskeletal: No edema, cyanosis, or clubbing. Neuro: Alert, answering all questions appropriately. Cranial nerves grossly intact. Skin: No rashes or petechiae noted. Psych: Normal affect.  LAB RESULTS:  Lab Results  Component Value Date   NA 133 (L) 02/07/2019   K 4.0 02/07/2019   CL 100 02/07/2019   CO2 27 02/07/2019   GLUCOSE 110 (H) 02/07/2019   BUN 13 02/07/2019   CREATININE 0.73 02/07/2019   CALCIUM 8.8 (L) 02/07/2019   PROT 6.9 02/07/2019   ALBUMIN 3.9 02/07/2019   AST 27 02/07/2019   ALT 24 02/07/2019   ALKPHOS 84 02/07/2019   BILITOT 0.6 02/07/2019   GFRNONAA >60 02/07/2019   GFRAA >60 02/07/2019    Lab Results  Component Value Date   WBC 7.4 02/07/2019   NEUTROABS 5.9 02/07/2019   HGB 13.4 02/07/2019   HCT 38.9 (L) 02/07/2019   MCV 96.3 02/07/2019   PLT 182 02/07/2019     STUDIES:  ASSESSMENT: Stage IIIa non-small cell lung cancer, right middle lobe.  PLAN:    1. Stage IIIa non-small cell lung cancer, right middle lobe: Case discussed with pathologist and unable to determine whether this is adenocarcinoma or squamous cell carcinoma.  There is also insufficient tissue to do further testing.  Liquid biopsy did not reveal any actionable mutations.  CT scan results from December 12, 2018 reviewed independently revealing stable versus mildly decreased pulmonary nodules. MRI of the brain completed on Mar 28, 2018 reviewed independently did not reveal metastatic disease.  Patient completed XRT June 26, 2018.  He completed his concurrent single agent carboplatinum on June 21, 2018.  Patient had a reaction to Taxol during cycle 1 and this was discontinued.  Patient initiated maintenance durvalumab on July 12, 2018.  Proceed with cycle 16 of maintenance durvalumab today.  Return to clinic in 2 weeks for treatment only and  then in 4 weeks for further evaluation and consideration  of cycle 18.  Initial plan was to reimage in May 2020, but will delay until the resolution of the COVID-19 pandemic.   2.  Secondary polycythemia: Resolved.  Likely secondary to heavy tobacco use.  Previously, the remainder of his laboratory work, including JAK-2 mutation, was either negative or within normal limits.  3.  Right upper lobe pulmonary nodule: CT scan results as above.  Proceed with treatment as planned.  4.  Colon polyps:  Patient has a personal history of greater then 10 adenomatous polyps on his most recent conoloscopy. He does not know of any family history of increased polyps or colon cancer.  Genetic testing to assess for the APC mutation for FAP or AFAP was negative. Continue colonoscopies as per GI. 5. Tobacco Use: Patient continues to heavily smoke.  He expressed understanding by continuing tobacco use increases his chance of recurrence. 6. Anxiety: Chronic.  Continue IV Benadryl with each treatment.  Continue treatment and evaluation per primary care. 7.  Hyponatremia: Chronic and unchanged.  Sodium level is 133 today.  Monitor.   Patient expressed understanding and was in agreement with this plan. He also understands that He can call clinic at any time with any questions, concerns, or complaints.    [name], MD   02/08/2019 6:57 AM

## 2019-02-07 NOTE — Progress Notes (Signed)
Patient stated that he had an emergency tooth pulled yesterday with the oral surgeon. But today he is doing better. Patient stated that his SOB is about the same.

## 2019-02-08 LAB — THYROID PANEL WITH TSH
Free Thyroxine Index: 1.3 (ref 1.2–4.9)
T3 Uptake Ratio: 17 % — ABNORMAL LOW (ref 24–39)
T4, Total: 7.4 ug/dL (ref 4.5–12.0)
TSH: 12.61 u[IU]/mL — ABNORMAL HIGH (ref 0.450–4.500)

## 2019-02-15 ENCOUNTER — Telehealth: Payer: Self-pay | Admitting: *Deleted

## 2019-02-15 ENCOUNTER — Other Ambulatory Visit: Payer: Self-pay | Admitting: Oncology

## 2019-02-15 NOTE — Telephone Encounter (Signed)
Pt's wife called in regarding recent TSH results seen on MyChart. Asks if pt's elevated TSH needs to be addressed and if this could be contributing to pt's increased lethargy and fatigue.   Please advise.

## 2019-02-15 NOTE — Telephone Encounter (Signed)
Ok, I will let them know. Thanks!

## 2019-02-15 NOTE — Telephone Encounter (Signed)
Yes, He should have a repeat Thyroid panel on 4/22 when he comes in. If that confirms the results, I will likely need to start him on synthroid.

## 2019-02-21 ENCOUNTER — Other Ambulatory Visit: Payer: Self-pay

## 2019-02-21 ENCOUNTER — Inpatient Hospital Stay: Payer: BLUE CROSS/BLUE SHIELD

## 2019-02-21 ENCOUNTER — Inpatient Hospital Stay: Payer: BLUE CROSS/BLUE SHIELD | Attending: Oncology

## 2019-02-21 VITALS — BP 120/79 | HR 81 | Temp 97.0°F | Resp 20 | Wt 210.0 lb

## 2019-02-21 DIAGNOSIS — C342 Malignant neoplasm of middle lobe, bronchus or lung: Secondary | ICD-10-CM | POA: Diagnosis not present

## 2019-02-21 DIAGNOSIS — C3491 Malignant neoplasm of unspecified part of right bronchus or lung: Secondary | ICD-10-CM

## 2019-02-21 LAB — CBC WITH DIFFERENTIAL/PLATELET
Abs Immature Granulocytes: 0.03 10*3/uL (ref 0.00–0.07)
Basophils Absolute: 0.1 10*3/uL (ref 0.0–0.1)
Basophils Relative: 1 %
Eosinophils Absolute: 0.3 10*3/uL (ref 0.0–0.5)
Eosinophils Relative: 3 %
HCT: 42 % (ref 39.0–52.0)
Hemoglobin: 14.4 g/dL (ref 13.0–17.0)
Immature Granulocytes: 0 %
Lymphocytes Relative: 9 %
Lymphs Abs: 0.8 10*3/uL (ref 0.7–4.0)
MCH: 32.7 pg (ref 26.0–34.0)
MCHC: 34.3 g/dL (ref 30.0–36.0)
MCV: 95.2 fL (ref 80.0–100.0)
Monocytes Absolute: 0.8 10*3/uL (ref 0.1–1.0)
Monocytes Relative: 9 %
Neutro Abs: 7 10*3/uL (ref 1.7–7.7)
Neutrophils Relative %: 78 %
Platelets: 224 10*3/uL (ref 150–400)
RBC: 4.41 MIL/uL (ref 4.22–5.81)
RDW: 12.6 % (ref 11.5–15.5)
WBC: 8.8 10*3/uL (ref 4.0–10.5)
nRBC: 0 % (ref 0.0–0.2)

## 2019-02-21 LAB — COMPREHENSIVE METABOLIC PANEL WITH GFR
ALT: 27 U/L (ref 0–44)
AST: 29 U/L (ref 15–41)
Albumin: 4 g/dL (ref 3.5–5.0)
Alkaline Phosphatase: 93 U/L (ref 38–126)
Anion gap: 7 (ref 5–15)
BUN: 11 mg/dL (ref 8–23)
CO2: 26 mmol/L (ref 22–32)
Calcium: 9 mg/dL (ref 8.9–10.3)
Chloride: 100 mmol/L (ref 98–111)
Creatinine, Ser: 0.92 mg/dL (ref 0.61–1.24)
GFR calc Af Amer: >60 mL/min
GFR calc non Af Amer: >60 mL/min
Glucose, Bld: 78 mg/dL (ref 70–99)
Potassium: 3.9 mmol/L (ref 3.5–5.1)
Sodium: 133 mmol/L — ABNORMAL LOW (ref 135–145)
Total Bilirubin: 0.6 mg/dL (ref 0.3–1.2)
Total Protein: 7.1 g/dL (ref 6.5–8.1)

## 2019-02-21 MED ORDER — DIPHENHYDRAMINE HCL 50 MG/ML IJ SOLN
25.0000 mg | Freq: Once | INTRAMUSCULAR | Status: AC
  Administered 2019-02-21: 25 mg via INTRAVENOUS
  Filled 2019-02-21: qty 1

## 2019-02-21 MED ORDER — SODIUM CHLORIDE 0.9 % IV SOLN
1000.0000 mg | Freq: Once | INTRAVENOUS | Status: AC
  Administered 2019-02-21: 11:00:00 1000 mg via INTRAVENOUS
  Filled 2019-02-21: qty 20

## 2019-02-21 MED ORDER — SODIUM CHLORIDE 0.9 % IV SOLN
Freq: Once | INTRAVENOUS | Status: AC
  Administered 2019-02-21: 09:00:00 via INTRAVENOUS
  Filled 2019-02-21: qty 250

## 2019-02-21 MED ORDER — HEPARIN SOD (PORK) LOCK FLUSH 100 UNIT/ML IV SOLN
500.0000 [IU] | Freq: Once | INTRAVENOUS | Status: AC
  Administered 2019-02-21: 500 [IU] via INTRAVENOUS
  Filled 2019-02-21: qty 5

## 2019-02-21 MED ORDER — DEXAMETHASONE SODIUM PHOSPHATE 10 MG/ML IJ SOLN
10.0000 mg | Freq: Once | INTRAMUSCULAR | Status: AC
  Administered 2019-02-21: 10 mg via INTRAVENOUS
  Filled 2019-02-21: qty 1

## 2019-02-21 MED ORDER — HEPARIN SOD (PORK) LOCK FLUSH 100 UNIT/ML IV SOLN: 500.0000 [IU] | Freq: Once | INTRAVENOUS | Status: DC | PRN

## 2019-02-21 MED ORDER — SODIUM CHLORIDE 0.9% FLUSH
10.0000 mL | INTRAVENOUS | Status: DC | PRN
  Administered 2019-02-21: 10 mL via INTRAVENOUS
  Filled 2019-02-21: qty 10

## 2019-02-21 NOTE — Progress Notes (Signed)
RN was able to receive blood return from pt's port to receive morning labs, CBC diff and CMP . When RN attempted to get TSH panel, pt.'s port did not give enough blood return. RN attempted multiple flushes and different positions to gain better blood return but had no success. Pt does not experience any discomfort while RN was flushing port, and there is no redness or swelling present with each flush. MD made aware that pt.'s port is having sluggish blood return and there is not enough present for TSH panel. Order to continue with treatment and to send pt to lab to receive TSH panel through peripheral. Pt updated.   Karsten Vaughn [employer]

## 2019-02-22 ENCOUNTER — Telehealth: Payer: Self-pay | Admitting: *Deleted

## 2019-02-22 LAB — THYROID PANEL WITH TSH
Free Thyroxine Index: 1.4 (ref 1.2–4.9)
T3 Uptake Ratio: 19 % — ABNORMAL LOW (ref 24–39)
T4, Total: 7.6 ug/dL (ref 4.5–12.0)
TSH: 7.19 u[IU]/mL — ABNORMAL HIGH (ref 0.450–4.500)

## 2019-02-22 NOTE — Telephone Encounter (Signed)
Spoke with pt's wife to make aware of TSH levels drawn yesterday. Informed of results and that Dr. Grayland Ormond would like to repeat labs again in 2 weeks before starting patient on synthroid. Pt's wife verbalized understanding.

## 2019-03-05 ENCOUNTER — Inpatient Hospital Stay: Payer: BC Managed Care – PPO | Attending: Oncology

## 2019-03-05 ENCOUNTER — Other Ambulatory Visit: Payer: Self-pay

## 2019-03-05 DIAGNOSIS — Z86010 Personal history of colonic polyps: Secondary | ICD-10-CM | POA: Diagnosis not present

## 2019-03-05 DIAGNOSIS — R531 Weakness: Secondary | ICD-10-CM | POA: Insufficient documentation

## 2019-03-05 DIAGNOSIS — I2510 Atherosclerotic heart disease of native coronary artery without angina pectoris: Secondary | ICD-10-CM | POA: Insufficient documentation

## 2019-03-05 DIAGNOSIS — K219 Gastro-esophageal reflux disease without esophagitis: Secondary | ICD-10-CM | POA: Insufficient documentation

## 2019-03-05 DIAGNOSIS — G4730 Sleep apnea, unspecified: Secondary | ICD-10-CM | POA: Insufficient documentation

## 2019-03-05 DIAGNOSIS — Z5112 Encounter for antineoplastic immunotherapy: Secondary | ICD-10-CM | POA: Diagnosis not present

## 2019-03-05 DIAGNOSIS — R05 Cough: Secondary | ICD-10-CM | POA: Diagnosis not present

## 2019-03-05 DIAGNOSIS — R0602 Shortness of breath: Secondary | ICD-10-CM | POA: Diagnosis not present

## 2019-03-05 DIAGNOSIS — J449 Chronic obstructive pulmonary disease, unspecified: Secondary | ICD-10-CM | POA: Diagnosis not present

## 2019-03-05 DIAGNOSIS — C3491 Malignant neoplasm of unspecified part of right bronchus or lung: Secondary | ICD-10-CM

## 2019-03-05 DIAGNOSIS — C342 Malignant neoplasm of middle lobe, bronchus or lung: Secondary | ICD-10-CM | POA: Diagnosis present

## 2019-03-05 DIAGNOSIS — Z923 Personal history of irradiation: Secondary | ICD-10-CM | POA: Diagnosis not present

## 2019-03-05 DIAGNOSIS — F419 Anxiety disorder, unspecified: Secondary | ICD-10-CM | POA: Diagnosis not present

## 2019-03-05 DIAGNOSIS — I10 Essential (primary) hypertension: Secondary | ICD-10-CM | POA: Diagnosis not present

## 2019-03-05 DIAGNOSIS — R5383 Other fatigue: Secondary | ICD-10-CM | POA: Diagnosis not present

## 2019-03-05 DIAGNOSIS — Z79899 Other long term (current) drug therapy: Secondary | ICD-10-CM | POA: Diagnosis not present

## 2019-03-05 DIAGNOSIS — F17210 Nicotine dependence, cigarettes, uncomplicated: Secondary | ICD-10-CM | POA: Diagnosis not present

## 2019-03-05 DIAGNOSIS — I509 Heart failure, unspecified: Secondary | ICD-10-CM | POA: Insufficient documentation

## 2019-03-05 LAB — CBC WITH DIFFERENTIAL/PLATELET
Abs Immature Granulocytes: 0.03 10*3/uL (ref 0.00–0.07)
Basophils Absolute: 0.1 10*3/uL (ref 0.0–0.1)
Basophils Relative: 1 %
Eosinophils Absolute: 0.3 10*3/uL (ref 0.0–0.5)
Eosinophils Relative: 4 %
HCT: 40.4 % (ref 39.0–52.0)
Hemoglobin: 13.9 g/dL (ref 13.0–17.0)
Immature Granulocytes: 0 %
Lymphocytes Relative: 11 %
Lymphs Abs: 1 10*3/uL (ref 0.7–4.0)
MCH: 33.1 pg (ref 26.0–34.0)
MCHC: 34.4 g/dL (ref 30.0–36.0)
MCV: 96.2 fL (ref 80.0–100.0)
Monocytes Absolute: 0.6 10*3/uL (ref 0.1–1.0)
Monocytes Relative: 7 %
Neutro Abs: 6.6 10*3/uL (ref 1.7–7.7)
Neutrophils Relative %: 77 %
Platelets: 189 10*3/uL (ref 150–400)
RBC: 4.2 MIL/uL — ABNORMAL LOW (ref 4.22–5.81)
RDW: 12.7 % (ref 11.5–15.5)
WBC: 8.6 10*3/uL (ref 4.0–10.5)
nRBC: 0 % (ref 0.0–0.2)

## 2019-03-05 LAB — COMPREHENSIVE METABOLIC PANEL
ALT: 21 U/L (ref 0–44)
AST: 26 U/L (ref 15–41)
Albumin: 3.9 g/dL (ref 3.5–5.0)
Alkaline Phosphatase: 95 U/L (ref 38–126)
Anion gap: 8 (ref 5–15)
BUN: 12 mg/dL (ref 8–23)
CO2: 26 mmol/L (ref 22–32)
Calcium: 9 mg/dL (ref 8.9–10.3)
Chloride: 101 mmol/L (ref 98–111)
Creatinine, Ser: 0.92 mg/dL (ref 0.61–1.24)
GFR calc Af Amer: >60 mL/min (ref >60)
GFR calc non Af Amer: >60 mL/min (ref >60)
Glucose, Bld: 122 mg/dL — ABNORMAL HIGH (ref 70–99)
Potassium: 4.5 mmol/L (ref 3.5–5.1)
Sodium: 135 mmol/L (ref 135–145)
Total Bilirubin: 0.5 mg/dL (ref 0.3–1.2)
Total Protein: 6.7 g/dL (ref 6.5–8.1)

## 2019-03-06 DIAGNOSIS — I5020 Unspecified systolic (congestive) heart failure: Secondary | ICD-10-CM | POA: Insufficient documentation

## 2019-03-06 LAB — THYROID PANEL WITH TSH
Free Thyroxine Index: 1.4 (ref 1.2–4.9)
T3 Uptake Ratio: 21 % — ABNORMAL LOW (ref 24–39)
T4, Total: 6.5 ug/dL (ref 4.5–12.0)
TSH: 9.6 u[IU]/mL — ABNORMAL HIGH (ref 0.450–4.500)

## 2019-03-07 ENCOUNTER — Inpatient Hospital Stay (HOSPITAL_BASED_OUTPATIENT_CLINIC_OR_DEPARTMENT_OTHER): Payer: BC Managed Care – PPO | Admitting: Oncology

## 2019-03-07 ENCOUNTER — Encounter: Payer: Self-pay | Admitting: Oncology

## 2019-03-07 ENCOUNTER — Inpatient Hospital Stay: Payer: BC Managed Care – PPO

## 2019-03-07 ENCOUNTER — Other Ambulatory Visit: Payer: Self-pay

## 2019-03-07 VITALS — BP 136/86 | HR 87 | Temp 98.7°F | Ht 72.0 in | Wt 211.0 lb

## 2019-03-07 DIAGNOSIS — G4730 Sleep apnea, unspecified: Secondary | ICD-10-CM

## 2019-03-07 DIAGNOSIS — J449 Chronic obstructive pulmonary disease, unspecified: Secondary | ICD-10-CM

## 2019-03-07 DIAGNOSIS — Z5111 Encounter for antineoplastic chemotherapy: Secondary | ICD-10-CM

## 2019-03-07 DIAGNOSIS — R531 Weakness: Secondary | ICD-10-CM

## 2019-03-07 DIAGNOSIS — Z923 Personal history of irradiation: Secondary | ICD-10-CM

## 2019-03-07 DIAGNOSIS — I509 Heart failure, unspecified: Secondary | ICD-10-CM

## 2019-03-07 DIAGNOSIS — Z79899 Other long term (current) drug therapy: Secondary | ICD-10-CM

## 2019-03-07 DIAGNOSIS — K219 Gastro-esophageal reflux disease without esophagitis: Secondary | ICD-10-CM

## 2019-03-07 DIAGNOSIS — C3491 Malignant neoplasm of unspecified part of right bronchus or lung: Secondary | ICD-10-CM

## 2019-03-07 DIAGNOSIS — R05 Cough: Secondary | ICD-10-CM

## 2019-03-07 DIAGNOSIS — C342 Malignant neoplasm of middle lobe, bronchus or lung: Secondary | ICD-10-CM

## 2019-03-07 DIAGNOSIS — R5383 Other fatigue: Secondary | ICD-10-CM

## 2019-03-07 DIAGNOSIS — R0602 Shortness of breath: Secondary | ICD-10-CM

## 2019-03-07 DIAGNOSIS — I10 Essential (primary) hypertension: Secondary | ICD-10-CM

## 2019-03-07 DIAGNOSIS — I2510 Atherosclerotic heart disease of native coronary artery without angina pectoris: Secondary | ICD-10-CM

## 2019-03-07 DIAGNOSIS — Z86010 Personal history of colonic polyps: Secondary | ICD-10-CM

## 2019-03-07 DIAGNOSIS — F419 Anxiety disorder, unspecified: Secondary | ICD-10-CM

## 2019-03-07 DIAGNOSIS — F17210 Nicotine dependence, cigarettes, uncomplicated: Secondary | ICD-10-CM

## 2019-03-07 MED ORDER — SODIUM CHLORIDE 0.9% FLUSH
10.0000 mL | INTRAVENOUS | Status: DC | PRN
  Administered 2019-03-07: 10 mL
  Filled 2019-03-07: qty 10

## 2019-03-07 MED ORDER — SODIUM CHLORIDE 0.9 % IV SOLN
1000.0000 mg | Freq: Once | INTRAVENOUS | Status: AC
  Administered 2019-03-07: 1000 mg via INTRAVENOUS
  Filled 2019-03-07: qty 20

## 2019-03-07 MED ORDER — LEVOTHYROXINE SODIUM 25 MCG PO TABS: 25.0000 ug | ORAL_TABLET | Freq: Every day | ORAL | 1 refills | Status: DC

## 2019-03-07 MED ORDER — DEXAMETHASONE SODIUM PHOSPHATE 10 MG/ML IJ SOLN
10.0000 mg | Freq: Once | INTRAMUSCULAR | Status: AC
  Administered 2019-03-07: 10 mg via INTRAVENOUS
  Filled 2019-03-07: qty 1

## 2019-03-07 MED ORDER — DIPHENHYDRAMINE HCL 50 MG/ML IJ SOLN
25.0000 mg | Freq: Once | INTRAMUSCULAR | Status: AC
  Administered 2019-03-07: 25 mg via INTRAVENOUS
  Filled 2019-03-07: qty 1

## 2019-03-07 MED ORDER — SODIUM CHLORIDE 0.9 % IV SOLN
Freq: Once | INTRAVENOUS | Status: AC
  Administered 2019-03-07: 10:00:00 via INTRAVENOUS
  Filled 2019-03-07: qty 250

## 2019-03-07 MED ORDER — DIAZEPAM 5 MG PO TABS: 10.0000 mg | ORAL_TABLET | Freq: Two times a day (BID) | ORAL | 1 refills | Status: DC | PRN

## 2019-03-07 MED ORDER — HEPARIN SOD (PORK) LOCK FLUSH 100 UNIT/ML IV SOLN
500.0000 [IU] | Freq: Once | INTRAVENOUS | Status: AC | PRN
  Administered 2019-03-07: 500 [IU]
  Filled 2019-03-07: qty 5

## 2019-03-07 NOTE — Progress Notes (Signed)
[place]  [phone]) [phone] Fax:(336) [phone]  ID: [name] OB: [birth year]  MR#: [id number]  TOI#:[id number]  Patient Care Team: Kirk Ruths, MD as PCP - General (Internal Medicine)  CHIEF COMPLAINT: Stage IIIa non-small cell lung cancer, right middle lobe.  INTERVAL HISTORY: Patient returns to clinic today for further evaluation and consideration of cycle 18 of maintenance durvalumab.  He continues to have chronic weakness and fatigue.  He also has a chronic cough with increased shortness of breath.  Patient reports he was recently diagnosed with congestive heart failure and ejection fraction of 40%.  He continues to be highly anxious. He has no neurologic complaints.  He denies any recent fevers or illnesses.  He does not complain of dysphasia today.  He denies any chest pain or hemoptysis.  He denies any nausea, vomiting, constipation, or diarrhea. He has no urinary complaints.  Patient offers no further specific complaints today.  REVIEW OF SYSTEMS:   Review of Systems  Constitutional: Positive for malaise/fatigue. Negative for fever and weight loss.  HENT: Negative.  Negative for congestion and sore throat.   Respiratory: Positive for cough and shortness of breath. Negative for hemoptysis.   Cardiovascular: Negative.  Negative for chest pain and leg swelling.  Gastrointestinal: Negative.  Negative for abdominal pain, blood in stool and melena.  Genitourinary: Negative.  Negative for dysuria.  Musculoskeletal: Negative.  Negative for joint pain.  Skin: Negative.  Negative for rash.  Neurological: Positive for weakness. Negative for sensory change, focal weakness and headaches.  Psychiatric/Behavioral: The patient is nervous/anxious.     As per HPI. Otherwise, a complete review of systems is negative.  PAST MEDICAL HISTORY: Past Medical History:  Diagnosis Date  . Anginal pain ([place])   . Anxiety   . Chest pain   . Chicken pox   .  Complication of anesthesia    o2 dropped after neck fusion  . COPD (chronic obstructive pulmonary disease) ([place])   . Coronary artery disease   . Cough    chronic  clear phlegm  . Dysrhythmia    palpitations  . GERD (gastroesophageal reflux disease)    h/o reflux/ hoarsness  . Hematochezia   . Hemorrhoids   . History of chickenpox   . History of colon polyps   . History of Helicobacter pylori infection   . Hoarseness   . Hypertension   . Lung cancer ([place]) 05/2016   Chemo + rad tx's.   . Migraines   . OSA (obstructive sleep apnea)    has CPAP but does not use  . Personal history of tobacco use, presenting hazards to health 03/05/2016  . Pneumonia    5/17  . Raynaud disease   . Raynaud disease   . Raynaud's disease   . Rotator cuff tear    on right  . Shortness of breath dyspnea   . Sleep apnea   . Ulcer (traumatic) of oral mucosa     PAST SURGICAL HISTORY: Past Surgical History:  Procedure Laterality Date  . BACK SURGERY     cervical fusion x 2  . CARDIAC CATHETERIZATION    . CERVICAL DISCECTOMY     x 2  . COLONOSCOPY    . COLONOSCOPY N/A 07/25/2015   Procedure: COLONOSCOPY;  Surgeon: Lollie Sails, MD;  Location[facility] ENDOSCOPY;  Service: Endoscopy;  Laterality: N/A;  . COLONOSCOPY WITH PROPOFOL N/A 10/04/2017   Procedure: COLONOSCOPY WITH PROPOFOL;  Surgeon: Lollie Sails, MD;  Location: [facility] ENDOSCOPY;  Service: Endoscopy;  Laterality: N/A;  . ELECTROMAGNETIC NAVIGATION BROCHOSCOPY Left 06/28/2016   Procedure: ELECTROMAGNETIC NAVIGATION BRONCHOSCOPY;  Surgeon: Vilinda Boehringer, MD;  Location: [facility] ORS;  Service: Cardiopulmonary;  Laterality: Left;  . ENDOBRONCHIAL ULTRASOUND N/A 04/11/2018   Procedure: ENDOBRONCHIAL ULTRASOUND;  Surgeon: [name], MD;  Location: [facility] ORS;  Service: Cardiopulmonary;  Laterality: N/A;  . ESOPHAGOGASTRODUODENOSCOPY N/A 07/25/2015   Procedure: ESOPHAGOGASTRODUODENOSCOPY (EGD);  Surgeon: Lollie Sails, MD;  Location: [facility]  ENDOSCOPY;  Service: Endoscopy;  Laterality: N/A;  . NASAL SINUS SURGERY     x 2   . PORTA CATH INSERTION N/A 04/24/2018   Procedure: PORTA CATH INSERTION;  Surgeon: Algernon Huxley, MD;  Location: [place] CV LAB;  Service: Cardiovascular;  Laterality: N/A;  . rotator cuff surgery Right    07/2016  . SEPTOPLASTY    . SKIN GRAFT      FAMILY HISTORY Family History  Problem Relation Age of Onset  . Heart disease Father   . Prostate cancer Father   . Heart disease Paternal Grandmother   . Heart attack Maternal Grandfather 52  . Kidney cancer Neg Hx   . Bladder Cancer Neg Hx   . Other Neg Hx        pituitary abnormality       ADVANCED DIRECTIVES:    HEALTH MAINTENANCE: Social History   Tobacco Use  . Smoking status: Current Every Day Smoker    Packs/day: 1.50    Years: 45.00    Pack years: 67.50    Types: Cigarettes  . Smokeless tobacco: Never Used  Substance Use Topics  . Alcohol use: Yes    Alcohol/week: 2.0 standard drinks    Types: 2 Standard drinks or equivalent per week    Comment: moderate  . Drug use: No     Allergies  Allergen Reactions  . Lisinopril Rash  . Varenicline Rash    Current Outpatient Medications  Medication Sig Dispense Refill  . acetaminophen (TYLENOL) 500 MG tablet Take 1,000 mg by mouth every 6 (six) hours as needed (for pain.).    Marland Kitchen albuterol (PROAIR HFA) 108 (90 Base) MCG/ACT inhaler Inhale 2 puffs into the lungs every 4 (four) hours as needed for wheezing or shortness of breath. 1 Inhaler 1  . atorvastatin (LIPITOR) 40 MG tablet Take 40 mg by mouth at bedtime.     . busPIRone (BUSPAR) 15 MG tablet Take 15 mg by mouth 2 (two) times daily.   10  . diazepam (VALIUM) 5 MG tablet Take 2 tablets (10 mg total) by mouth every 12 (twelve) hours as needed for anxiety. 60 tablet 1  . DULoxetine (CYMBALTA) 60 MG capsule Take 1 capsule by mouth 1 day or 1 dose.    . fluticasone (FLONASE) 50 MCG/ACT nasal spray Place 1 spray into both nostrils  daily as needed (for allergies.).     Marland Kitchen ipratropium-albuterol (DUONEB) 0.5-2.5 (3) MG/3ML SOLN Take 3 mLs by nebulization every 6 (six) hours. 360 mL 5  . lidocaine (XYLOCAINE) 2 % solution   2  . metoprolol tartrate (LOPRESSOR) 25 MG tablet Take 25 mg by mouth 2 (two) times daily.    . mirabegron ER (MYRBETRIQ) 50 MG TB24 tablet Take 1 tablet (50 mg total) by mouth daily. 90 tablet 3  . Multiple Vitamin (MULTIVITAMIN WITH MINERALS) TABS tablet Take 1 tablet by mouth daily.     . nicotine (NICODERM CQ - DOSED IN MG/24 HOURS) 21 mg/24hr patch Place 1 patch (21 mg total) onto  the skin daily. 30 patch 12  . ondansetron (ZOFRAN) 8 MG tablet Take 8 mg by mouth every 8 (eight) hours as needed for nausea or vomiting.    . OXYGEN Inhale 2 L into the lungs at bedtime.     . pantoprazole (PROTONIX) 40 MG tablet Take 40 mg by mouth daily.  3  . potassium chloride (K-DUR) 10 MEQ tablet TAKE 1 TABLET (10 MEQ TOTAL) BY MOUTH ONCE DAILY.  3  . umeclidinium-vilanterol (ANORO ELLIPTA) 62.5-25 MCG/INH AEPB Inhale 1 puff into the lungs daily. 1 each 10  . umeclidinium-vilanterol (ANORO ELLIPTA) 62.5-25 MCG/INH AEPB Inhale 1 puff into the lungs daily. 1 each 0  . HYDROcodone-acetaminophen (NORCO/VICODIN) 5-325 MG tablet Take 1 tablet by mouth every 4 (four) hours as needed.    Marland Kitchen levothyroxine (SYNTHROID) 25 MCG tablet Take 1 tablet (25 mcg total) by mouth daily before breakfast. 30 tablet 1  . nitroGLYCERIN (NITROSTAT) 0.4 MG SL tablet Place 0.4 mg under the tongue every 5 (five) minutes as needed for chest pain.      No current facility-administered medications for this visit.    Facility-Administered Medications Ordered in Other Visits  Medication Dose Route Frequency Provider Last Rate Last Dose  . durvalumab (IMFINZI) 1,000 mg in sodium chloride 0.9 % 100 mL chemo infusion  1,000 mg Intravenous Once [name], MD      . heparin lock flush 100 unit/mL  500 Units Intravenous Once [name],  MD      . heparin lock flush 100 unit/mL  500 Units Intracatheter Once PRN [name], MD      . sodium chloride flush (NS) 0.9 % injection 10 mL  10 mL Intracatheter PRN [name], MD   10 mL at 03/07/19 0952    OBJECTIVE: Vitals:   03/07/19 0915  BP: 136/86  Pulse: 87  Temp: 98.7 F (37.1 C)     Body mass index is 28.62 kg/m.    ECOG FS:0 - Asymptomatic  General: Well-developed, well-nourished, no acute distress. Eyes: Pink conjunctiva, anicteric sclera. HEENT: Normocephalic, moist mucous membrane. Lungs: Clear to auscultation bilaterally. Heart: Regular rate and rhythm. No rubs, murmurs, or gallops. Abdomen: Soft, nontender, nondistended. No organomegaly noted, normoactive bowel sounds. Musculoskeletal: No edema, cyanosis, or clubbing. Neuro: Alert, answering all questions appropriately. Cranial nerves grossly intact. Skin: No rashes or petechiae noted. Psych: Normal affect.  LAB RESULTS:  Lab Results  Component Value Date   NA 135 03/05/2019   K 4.5 03/05/2019   CL 101 03/05/2019   CO2 26 03/05/2019   GLUCOSE 122 (H) 03/05/2019   BUN 12 03/05/2019   CREATININE 0.92 03/05/2019   CALCIUM 9.0 03/05/2019   PROT 6.7 03/05/2019   ALBUMIN 3.9 03/05/2019   AST 26 03/05/2019   ALT 21 03/05/2019   ALKPHOS 95 03/05/2019   BILITOT 0.5 03/05/2019   GFRNONAA >60 03/05/2019   GFRAA >60 03/05/2019    Lab Results  Component Value Date   WBC 8.6 03/05/2019   NEUTROABS 6.6 03/05/2019   HGB 13.9 03/05/2019   HCT 40.4 03/05/2019   MCV 96.2 03/05/2019   PLT 189 03/05/2019     STUDIES:  ASSESSMENT: Stage IIIa non-small cell lung cancer, right middle lobe.  PLAN:    1. Stage IIIa non-small cell lung cancer, right middle lobe: Case discussed with pathologist and unable to determine whether this is adenocarcinoma or squamous cell carcinoma.  There is also insufficient tissue to do further testing.  Liquid biopsy did not reveal any actionable mutations.   CT scan results from December 12, 2018 reviewed independently revealing stable versus mildly decreased pulmonary nodules. MRI of the brain completed on Mar 28, 2018 reviewed independently did not reveal metastatic disease.  Patient completed XRT June 26, 2018.  He completed his concurrent single agent carboplatinum on June 21, 2018.  Patient had a reaction to Taxol during cycle 1 and this was discontinued.  Patient initiated maintenance durvalumab on July 12, 2018.  Proceed with cycle 18 of maintenance durvalumab today.  Return to clinic in 2 weeks for treatment only and then in 4 weeks for further evaluation and consideration of cycle 20.  Will reimage 1 to 2 days prior to cycle 20. 2.  Secondary polycythemia: Resolved.  Likely secondary to heavy tobacco use.  Previously, the remainder of his laboratory work, including JAK-2 mutation, was either negative or within normal limits.  3.  Right upper lobe pulmonary nodule: CT scan results as above.  Proceed with treatment as planned.  4.  Colon polyps:  Patient has a personal history of greater then 10 adenomatous polyps on his most recent conoloscopy. He does not know of any family history of increased polyps or colon cancer.  Genetic testing to assess for the APC mutation for FAP or AFAP was negative. Continue colonoscopies as per GI. 5. Tobacco Use: Patient continues to heavily smoke.  He expressed understanding by continuing tobacco use increases his chance of recurrence. 6. Anxiety: Chronic.  Continue IV Benadryl with each treatment.  Patient also requested an increase in his Valium prescription and was given 10 mg of IM every 12 hours as needed today.  Continue treatment and evaluation per primary care. 7.  Hyponatremia: Resolved. 8.  Weakness and fatigue: Although likely related to his newly diagnosed congestive heart failure with an EF of 40%, patient does have an increased TSH with a borderline low free thyroxine index therefore he was given a  prescription for 25 mcg Synthroid today. 9.  Shortness of breath/cough: Secondary to underlying CHF.  Okay to proceed with any treatment necessary from an oncology standpoint.   Patient expressed understanding and was in agreement with this plan. He also understands that He can call clinic at any time with any questions, concerns, or complaints.    [name], MD   03/07/2019 10:08 AM

## 2019-03-07 NOTE — Progress Notes (Signed)
Patient stated that he was concerned about his thyroid. Patient also stated that he has not been "feeling well". Patient denied fever, chills, nausea, vomiting, constipation and/or diarrhea.

## 2019-03-19 ENCOUNTER — Other Ambulatory Visit: Payer: Self-pay | Admitting: Urology

## 2019-03-21 ENCOUNTER — Other Ambulatory Visit: Payer: Self-pay

## 2019-03-21 ENCOUNTER — Inpatient Hospital Stay: Payer: BC Managed Care – PPO

## 2019-03-21 VITALS — BP 131/85 | HR 97 | Temp 97.2°F | Resp 18 | Wt 214.8 lb

## 2019-03-21 DIAGNOSIS — C342 Malignant neoplasm of middle lobe, bronchus or lung: Secondary | ICD-10-CM | POA: Diagnosis not present

## 2019-03-21 DIAGNOSIS — C3491 Malignant neoplasm of unspecified part of right bronchus or lung: Secondary | ICD-10-CM

## 2019-03-21 MED ORDER — DEXAMETHASONE SODIUM PHOSPHATE 10 MG/ML IJ SOLN
10.0000 mg | Freq: Once | INTRAMUSCULAR | Status: AC
  Administered 2019-03-21: 10 mg via INTRAVENOUS
  Filled 2019-03-21: qty 1

## 2019-03-21 MED ORDER — SODIUM CHLORIDE 0.9 % IV SOLN
Freq: Once | INTRAVENOUS | Status: AC
  Administered 2019-03-21: 10:00:00 via INTRAVENOUS
  Filled 2019-03-21: qty 250

## 2019-03-21 MED ORDER — DIPHENHYDRAMINE HCL 50 MG/ML IJ SOLN
25.0000 mg | Freq: Once | INTRAMUSCULAR | Status: AC
  Administered 2019-03-21: 25 mg via INTRAVENOUS
  Filled 2019-03-21: qty 1

## 2019-03-21 MED ORDER — SODIUM CHLORIDE 0.9 % IV SOLN
1000.0000 mg | Freq: Once | INTRAVENOUS | Status: AC
  Administered 2019-03-21: 1000 mg via INTRAVENOUS
  Filled 2019-03-21: qty 20

## 2019-03-21 MED ORDER — HEPARIN SOD (PORK) LOCK FLUSH 100 UNIT/ML IV SOLN
500.0000 [IU] | Freq: Once | INTRAVENOUS | Status: AC | PRN
  Administered 2019-03-21: 500 [IU]
  Filled 2019-03-21: qty 5

## 2019-03-21 NOTE — Progress Notes (Signed)
MD made aware that pt.'s port did not give any blood return. Multiple flushes, multiple RNs checked placement, and different positions were attempted to get a blood return but was unsuccessful. Pt does not complain of any discomfort, there is no redness, and no swelling while flushes were performed. Pt states that "I can taste it when you flush through it." No new orders given at this time and to proceed with treatment today.   Nicie Milan [employer]

## 2019-03-23 ENCOUNTER — Ambulatory Visit
Admission: RE | Admit: 2019-03-23 | Discharge: 2019-03-23 | Disposition: A | Discharge status: Home / Self Care | Payer: BLUE CROSS/BLUE SHIELD | Source: Ambulatory Visit | Attending: Oncology | Admitting: Oncology

## 2019-03-23 ENCOUNTER — Other Ambulatory Visit: Payer: Self-pay

## 2019-03-23 DIAGNOSIS — C3491 Malignant neoplasm of unspecified part of right bronchus or lung: Secondary | ICD-10-CM | POA: Diagnosis present

## 2019-03-23 HISTORY — DX: Heart failure, unspecified: I50.9

## 2019-03-23 MED ORDER — IOHEXOL 300 MG/ML  SOLN
75.0000 mL | Freq: Once | INTRAMUSCULAR | Status: AC | PRN
  Administered 2019-03-23: 10:00:00 75 mL via INTRAVENOUS

## 2019-03-30 ENCOUNTER — Ambulatory Visit: Payer: BLUE CROSS/BLUE SHIELD

## 2019-03-30 NOTE — Progress Notes (Signed)
[place]  Telephone:(336) [phone] Fax:(336) [phone]  ID: Paul Carpenter OB: [birth year]  MR#: [id number]  URK#:[id number]  Patient Care Team: Kirk Ruths, MD as PCP - General (Internal Medicine)  CHIEF COMPLAINT: Stage IIIa non-small cell lung cancer, right middle lobe.  INTERVAL HISTORY: Patient returns to clinic today for further evaluation and consideration of cycle 20 of maintenance durvalumab.  He continues to have chronic weakness and fatigue as well as cough, but otherwise feels well.  He continues to be highly anxious. He has no neurologic complaints.  He denies any recent fevers or illnesses.  He does not complain of dysphasia today.  He denies any chest pain, shortness of breath, or hemoptysis.  He denies any nausea, vomiting, constipation, or diarrhea. He has no urinary complaints.  Patient offers no further specific complaints today.  REVIEW OF SYSTEMS:   Review of Systems  Constitutional: Positive for malaise/fatigue. Negative for fever and weight loss.  HENT: Negative.  Negative for congestion and sore throat.   Respiratory: Positive for cough. Negative for hemoptysis and shortness of breath.   Cardiovascular: Negative.  Negative for chest pain and leg swelling.  Gastrointestinal: Negative.  Negative for abdominal pain, blood in stool and melena.  Genitourinary: Negative.  Negative for dysuria.  Musculoskeletal: Negative.  Negative for joint pain.  Skin: Negative.  Negative for rash.  Neurological: Positive for weakness. Negative for sensory change, focal weakness and headaches.  Psychiatric/Behavioral: The patient is nervous/anxious.     As per HPI. Otherwise, a complete review of systems is negative.  PAST MEDICAL HISTORY: Past Medical History:  Diagnosis Date  . Anginal pain ([place])   . Anxiety   . Chest pain   . CHF (congestive heart failure) ([place])   . Chicken pox   . Complication of anesthesia    o2 dropped after neck fusion  .  COPD (chronic obstructive pulmonary disease) ([place])   . Coronary artery disease   . Cough    chronic  clear phlegm  . Dysrhythmia    palpitations  . GERD (gastroesophageal reflux disease)    h/o reflux/ hoarsness  . Hematochezia   . Hemorrhoids   . History of chickenpox   . History of colon polyps   . History of Helicobacter pylori infection   . Hoarseness   . Hypertension   . Lung cancer ([place]) 05/2016   Chemo + rad tx's.   . Migraines   . OSA (obstructive sleep apnea)    has CPAP but does not use  . Personal history of tobacco use, presenting hazards to health 03/05/2016  . Pneumonia    5/17  . Raynaud disease   . Raynaud disease   . Raynaud's disease   . Rotator cuff tear    on right  . Shortness of breath dyspnea   . Sleep apnea   . Ulcer (traumatic) of oral mucosa     PAST SURGICAL HISTORY: Past Surgical History:  Procedure Laterality Date  . BACK SURGERY     cervical fusion x 2  . CARDIAC CATHETERIZATION    . CERVICAL DISCECTOMY     x 2  . COLONOSCOPY    . COLONOSCOPY N/A 07/25/2015   Procedure: COLONOSCOPY;  Surgeon: Lollie Sails, MD;  Location: [facility] ENDOSCOPY;  Service: Endoscopy;  Laterality: N/A;  . COLONOSCOPY WITH PROPOFOL N/A 10/04/2017   Procedure: COLONOSCOPY WITH PROPOFOL;  Surgeon: Lollie Sails, MD;  Location: [facility] ENDOSCOPY;  Service: Endoscopy;  Laterality: N/A;  . ELECTROMAGNETIC  NAVIGATION BROCHOSCOPY Left 06/28/2016   Procedure: ELECTROMAGNETIC NAVIGATION BRONCHOSCOPY;  Surgeon: Vilinda Boehringer, MD;  Location: [facility] ORS;  Service: Cardiopulmonary;  Laterality: Left;  . ENDOBRONCHIAL ULTRASOUND N/A 04/11/2018   Procedure: ENDOBRONCHIAL ULTRASOUND;  Surgeon: [name], MD;  Location: [facility] ORS;  Service: Cardiopulmonary;  Laterality: N/A;  . ESOPHAGOGASTRODUODENOSCOPY N/A 07/25/2015   Procedure: ESOPHAGOGASTRODUODENOSCOPY (EGD);  Surgeon: Lollie Sails, MD;  Location: [facility] ENDOSCOPY;  Service: Endoscopy;  Laterality: N/A;  . NASAL SINUS  SURGERY     x 2   . PORTA CATH INSERTION N/A 04/24/2018   Procedure: PORTA CATH INSERTION;  Surgeon: Algernon Huxley, MD;  Location: [place] CV LAB;  Service: Cardiovascular;  Laterality: N/A;  . rotator cuff surgery Right    07/2016  . SEPTOPLASTY    . SKIN GRAFT      FAMILY HISTORY Family History  Problem Relation Age of Onset  . Heart disease Father   . Prostate cancer Father   . Heart disease Paternal Grandmother   . Heart attack Maternal Grandfather 52  . Kidney cancer Neg Hx   . Bladder Cancer Neg Hx   . Other Neg Hx        pituitary abnormality       ADVANCED DIRECTIVES:    HEALTH MAINTENANCE: Social History   Tobacco Use  . Smoking status: Current Every Day Smoker    Packs/day: 1.50    Years: 45.00    Pack years: 67.50    Types: Cigarettes  . Smokeless tobacco: Never Used  Substance Use Topics  . Alcohol use: Yes    Alcohol/week: 2.0 standard drinks    Types: 2 Standard drinks or equivalent per week    Comment: moderate  . Drug use: No     Allergies  Allergen Reactions  . Lisinopril Rash  . Varenicline Rash    Current Outpatient Medications  Medication Sig Dispense Refill  . acetaminophen (TYLENOL) 500 MG tablet Take 1,000 mg by mouth every 6 (six) hours as needed (for pain.).    Marland Kitchen albuterol (PROAIR HFA) 108 (90 Base) MCG/ACT inhaler Inhale 2 puffs into the lungs every 4 (four) hours as needed for wheezing or shortness of breath. 1 Inhaler 1  . atorvastatin (LIPITOR) 40 MG tablet Take 40 mg by mouth at bedtime.     . busPIRone (BUSPAR) 15 MG tablet Take 15 mg by mouth 2 (two) times daily.   10  . diazepam (VALIUM) 5 MG tablet Take 2 tablets (10 mg total) by mouth every 12 (twelve) hours as needed for anxiety. 60 tablet 1  . DULoxetine (CYMBALTA) 60 MG capsule Take 1 capsule by mouth 1 day or 1 dose.    . fluticasone (FLONASE) 50 MCG/ACT nasal spray Place 1 spray into both nostrils daily as needed (for allergies.).     Marland Kitchen ipratropium-albuterol  (DUONEB) 0.5-2.5 (3) MG/3ML SOLN Take 3 mLs by nebulization every 6 (six) hours. 360 mL 5  . levothyroxine (SYNTHROID) 25 MCG tablet Take 1 tablet (25 mcg total) by mouth daily before breakfast. 30 tablet 1  . lidocaine (XYLOCAINE) 2 % solution   2  . metoprolol tartrate (LOPRESSOR) 25 MG tablet Take 25 mg by mouth 2 (two) times daily.    . Multiple Vitamin (MULTIVITAMIN WITH MINERALS) TABS tablet Take 1 tablet by mouth daily.     Marland Kitchen MYRBETRIQ 50 MG TB24 tablet TAKE 1 TABLET BY MOUTH EVERY DAY 90 tablet 3  . ondansetron (ZOFRAN) 8 MG tablet Take 8 mg by  mouth every 8 (eight) hours as needed for nausea or vomiting.    . OXYGEN Inhale 2 L into the lungs at bedtime.     . pantoprazole (PROTONIX) 40 MG tablet Take 40 mg by mouth daily.  3  . potassium chloride (K-DUR) 10 MEQ tablet TAKE 1 TABLET (10 MEQ TOTAL) BY MOUTH ONCE DAILY.  3  . umeclidinium-vilanterol (ANORO ELLIPTA) 62.5-25 MCG/INH AEPB Inhale 1 puff into the lungs daily. 1 each 10  . nitroGLYCERIN (NITROSTAT) 0.4 MG SL tablet Place 0.4 mg under the tongue every 5 (five) minutes as needed for chest pain.      No current facility-administered medications for this visit.    Facility-Administered Medications Ordered in Other Visits  Medication Dose Route Frequency Provider Last Rate Last Dose  . heparin lock flush 100 unit/mL  500 Units Intravenous Once [name], MD        OBJECTIVE: Vitals:   04/04/19 1019  BP: 119/77  Pulse: 88  Temp: 98.7 F (37.1 C)     Body mass index is 29.02 kg/m.    ECOG FS:0 - Asymptomatic  General: Well-developed, well-nourished, no acute distress. Eyes: Pink conjunctiva, anicteric sclera. HEENT: Normocephalic, moist mucous membranes. Lungs: Clear to auscultation bilaterally. Heart: Regular rate and rhythm. No rubs, murmurs, or gallops. Abdomen: Soft, nontender, nondistended. No organomegaly noted, normoactive bowel sounds. Musculoskeletal: No edema, cyanosis, or clubbing. Neuro: Alert,  answering all questions appropriately. Cranial nerves grossly intact. Skin: No rashes or petechiae noted. Psych: Normal affect.  LAB RESULTS:  Lab Results  Component Value Date   NA 135 04/02/2019   K 4.2 04/02/2019   CL 99 04/02/2019   CO2 27 04/02/2019   GLUCOSE 125 (H) 04/02/2019   BUN 12 04/02/2019   CREATININE 0.90 04/02/2019   CALCIUM 9.1 04/02/2019   PROT 7.1 04/02/2019   ALBUMIN 3.9 04/02/2019   AST 27 04/02/2019   ALT 22 04/02/2019   ALKPHOS 97 04/02/2019   BILITOT 0.6 04/02/2019   GFRNONAA >60 04/02/2019   GFRAA >60 04/02/2019    Lab Results  Component Value Date   WBC 8.3 04/02/2019   NEUTROABS 6.7 04/02/2019   HGB 14.4 04/02/2019   HCT 41.7 04/02/2019   MCV 94.3 04/02/2019   PLT 180 04/02/2019     STUDIES:  ASSESSMENT: Stage IIIa non-small cell lung cancer, right middle lobe.  PLAN:    1. Stage IIIa non-small cell lung cancer, right middle lobe: Case discussed with pathologist and unable to determine whether this is adenocarcinoma or squamous cell carcinoma.  There is also insufficient tissue to do further testing.  Liquid biopsy did not reveal any actionable mutations.  CT scan results from Mar 23, 2019 reviewed independently with stable disease and no evidence of recurrence or progression. MRI of the brain completed on Mar 28, 2018 reviewed independently did not reveal metastatic disease.  Patient completed XRT June 26, 2018.  He completed his concurrent single agent carboplatinum on June 21, 2018.  Patient had a reaction to Taxol during cycle 1 and this was discontinued.  Patient initiated maintenance durvalumab on July 12, 2018.  Proceed with cycle 20 of maintenance durvalumab today.  Return to clinic in 2 weeks for treatment only and then in 4 weeks for further evaluation and consideration of cycle 22.  Will reimage at the conclusion of his maintenance treatments in October 2020.   2.  Secondary polycythemia: Resolved.  Likely secondary to heavy  tobacco use.  Previously, the remainder of his  laboratory work, including JAK-2 mutation, was either negative or within normal limits.  3.  Right upper lobe pulmonary nodule: CT scan results as above.  Proceed with treatment as planned.  4.  Colon polyps:  Patient has a personal history of greater then 10 adenomatous polyps on his most recent conoloscopy. He does not know of any family history of increased polyps or colon cancer.  Genetic testing to assess for the APC mutation for FAP or AFAP was negative. Continue colonoscopies as per GI. 5. Tobacco Use: Patient continues to heavily smoke.  He expressed understanding by continuing tobacco use increases his chance of recurrence. 6. Anxiety: Chronic.  Continue IV Benadryl with each treatment.  Continue Valium 10 mg every 12 hours as needed.  Continue treatment and evaluation per primary care. 7.  Hyponatremia: Resolved. 8.  Hypothyroidism: Patient has a increased TSH with a borderline low free thyroxine index, continue 25 mcg Synthroid daily. 9.  CHF: Patient has an EF of 40%.  Continue evaluation and treatment per cardiology. 10.  Weakness and fatigue: Multifactorial.   Patient expressed understanding and was in agreement with this plan. He also understands that He can call clinic at any time with any questions, concerns, or complaints.    [name], MD   04/06/2019 7:03 AM

## 2019-04-02 ENCOUNTER — Inpatient Hospital Stay: Payer: BC Managed Care – PPO | Attending: Oncology

## 2019-04-02 ENCOUNTER — Other Ambulatory Visit: Payer: Self-pay

## 2019-04-02 DIAGNOSIS — Z79899 Other long term (current) drug therapy: Secondary | ICD-10-CM | POA: Diagnosis not present

## 2019-04-02 DIAGNOSIS — Z923 Personal history of irradiation: Secondary | ICD-10-CM | POA: Diagnosis not present

## 2019-04-02 DIAGNOSIS — F419 Anxiety disorder, unspecified: Secondary | ICD-10-CM | POA: Insufficient documentation

## 2019-04-02 DIAGNOSIS — C342 Malignant neoplasm of middle lobe, bronchus or lung: Secondary | ICD-10-CM | POA: Insufficient documentation

## 2019-04-02 DIAGNOSIS — E039 Hypothyroidism, unspecified: Secondary | ICD-10-CM | POA: Insufficient documentation

## 2019-04-02 DIAGNOSIS — F17210 Nicotine dependence, cigarettes, uncomplicated: Secondary | ICD-10-CM | POA: Insufficient documentation

## 2019-04-02 DIAGNOSIS — C3491 Malignant neoplasm of unspecified part of right bronchus or lung: Secondary | ICD-10-CM

## 2019-04-02 DIAGNOSIS — I10 Essential (primary) hypertension: Secondary | ICD-10-CM | POA: Diagnosis not present

## 2019-04-02 DIAGNOSIS — J449 Chronic obstructive pulmonary disease, unspecified: Secondary | ICD-10-CM | POA: Insufficient documentation

## 2019-04-02 DIAGNOSIS — G4733 Obstructive sleep apnea (adult) (pediatric): Secondary | ICD-10-CM | POA: Insufficient documentation

## 2019-04-02 DIAGNOSIS — Z5112 Encounter for antineoplastic immunotherapy: Secondary | ICD-10-CM | POA: Insufficient documentation

## 2019-04-02 DIAGNOSIS — Z7951 Long term (current) use of inhaled steroids: Secondary | ICD-10-CM | POA: Insufficient documentation

## 2019-04-02 DIAGNOSIS — I509 Heart failure, unspecified: Secondary | ICD-10-CM | POA: Diagnosis not present

## 2019-04-02 LAB — COMPREHENSIVE METABOLIC PANEL
ALT: 22 U/L (ref 0–44)
AST: 27 U/L (ref 15–41)
Albumin: 3.9 g/dL (ref 3.5–5.0)
Alkaline Phosphatase: 97 U/L (ref 38–126)
Anion gap: 9 (ref 5–15)
BUN: 12 mg/dL (ref 8–23)
CO2: 27 mmol/L (ref 22–32)
Calcium: 9.1 mg/dL (ref 8.9–10.3)
Chloride: 99 mmol/L (ref 98–111)
Creatinine, Ser: 0.9 mg/dL (ref 0.61–1.24)
GFR calc Af Amer: >60 mL/min (ref >60)
GFR calc non Af Amer: >60 mL/min (ref >60)
Glucose, Bld: 125 mg/dL — ABNORMAL HIGH (ref 70–99)
Potassium: 4.2 mmol/L (ref 3.5–5.1)
Sodium: 135 mmol/L (ref 135–145)
Total Bilirubin: 0.6 mg/dL (ref 0.3–1.2)
Total Protein: 7.1 g/dL (ref 6.5–8.1)

## 2019-04-02 LAB — CBC WITH DIFFERENTIAL/PLATELET
Abs Immature Granulocytes: 0.05 10*3/uL (ref 0.00–0.07)
Basophils Absolute: 0 10*3/uL (ref 0.0–0.1)
Basophils Relative: 1 %
Eosinophils Absolute: 0.1 10*3/uL (ref 0.0–0.5)
Eosinophils Relative: 2 %
HCT: 41.7 % (ref 39.0–52.0)
Hemoglobin: 14.4 g/dL (ref 13.0–17.0)
Immature Granulocytes: 1 %
Lymphocytes Relative: 10 %
Lymphs Abs: 0.8 10*3/uL (ref 0.7–4.0)
MCH: 32.6 pg (ref 26.0–34.0)
MCHC: 34.5 g/dL (ref 30.0–36.0)
MCV: 94.3 fL (ref 80.0–100.0)
Monocytes Absolute: 0.5 10*3/uL (ref 0.1–1.0)
Monocytes Relative: 6 %
Neutro Abs: 6.7 10*3/uL (ref 1.7–7.7)
Neutrophils Relative %: 80 %
Platelets: 180 10*3/uL (ref 150–400)
RBC: 4.42 MIL/uL (ref 4.22–5.81)
RDW: 12.6 % (ref 11.5–15.5)
WBC: 8.3 10*3/uL (ref 4.0–10.5)
nRBC: 0 % (ref 0.0–0.2)

## 2019-04-03 LAB — THYROID PANEL WITH TSH
Free Thyroxine Index: 1.4 (ref 1.2–4.9)
T3 Uptake Ratio: 20 % — ABNORMAL LOW (ref 24–39)
T4, Total: 7.2 ug/dL (ref 4.5–12.0)
TSH: 12.98 u[IU]/mL — ABNORMAL HIGH (ref 0.450–4.500)

## 2019-04-04 ENCOUNTER — Encounter: Payer: Self-pay | Admitting: Oncology

## 2019-04-04 ENCOUNTER — Other Ambulatory Visit: Payer: Self-pay

## 2019-04-04 ENCOUNTER — Inpatient Hospital Stay: Payer: BC Managed Care – PPO

## 2019-04-04 ENCOUNTER — Inpatient Hospital Stay (HOSPITAL_BASED_OUTPATIENT_CLINIC_OR_DEPARTMENT_OTHER): Payer: BC Managed Care – PPO | Admitting: Oncology

## 2019-04-04 VITALS — BP 119/77 | HR 88 | Temp 98.7°F | Ht 72.0 in | Wt 214.0 lb

## 2019-04-04 DIAGNOSIS — Z923 Personal history of irradiation: Secondary | ICD-10-CM

## 2019-04-04 DIAGNOSIS — F17210 Nicotine dependence, cigarettes, uncomplicated: Secondary | ICD-10-CM

## 2019-04-04 DIAGNOSIS — J449 Chronic obstructive pulmonary disease, unspecified: Secondary | ICD-10-CM

## 2019-04-04 DIAGNOSIS — C3491 Malignant neoplasm of unspecified part of right bronchus or lung: Secondary | ICD-10-CM

## 2019-04-04 DIAGNOSIS — F419 Anxiety disorder, unspecified: Secondary | ICD-10-CM | POA: Diagnosis not present

## 2019-04-04 DIAGNOSIS — Z79899 Other long term (current) drug therapy: Secondary | ICD-10-CM

## 2019-04-04 DIAGNOSIS — G4733 Obstructive sleep apnea (adult) (pediatric): Secondary | ICD-10-CM

## 2019-04-04 DIAGNOSIS — C342 Malignant neoplasm of middle lobe, bronchus or lung: Secondary | ICD-10-CM | POA: Diagnosis not present

## 2019-04-04 DIAGNOSIS — E039 Hypothyroidism, unspecified: Secondary | ICD-10-CM | POA: Diagnosis not present

## 2019-04-04 DIAGNOSIS — Z7951 Long term (current) use of inhaled steroids: Secondary | ICD-10-CM

## 2019-04-04 DIAGNOSIS — I10 Essential (primary) hypertension: Secondary | ICD-10-CM

## 2019-04-04 MED ORDER — DEXAMETHASONE SODIUM PHOSPHATE 10 MG/ML IJ SOLN
10.0000 mg | Freq: Once | INTRAMUSCULAR | Status: AC
  Administered 2019-04-04: 10 mg via INTRAVENOUS
  Filled 2019-04-04: qty 1

## 2019-04-04 MED ORDER — DIPHENHYDRAMINE HCL 50 MG/ML IJ SOLN
25.0000 mg | Freq: Once | INTRAMUSCULAR | Status: AC
  Administered 2019-04-04: 25 mg via INTRAVENOUS
  Filled 2019-04-04: qty 1

## 2019-04-04 MED ORDER — SODIUM CHLORIDE 0.9 % IV SOLN
Freq: Once | INTRAVENOUS | Status: AC
  Administered 2019-04-04: 12:00:00 via INTRAVENOUS
  Filled 2019-04-04: qty 250

## 2019-04-04 MED ORDER — HEPARIN SOD (PORK) LOCK FLUSH 100 UNIT/ML IV SOLN
500.0000 [IU] | Freq: Once | INTRAVENOUS | Status: AC | PRN
  Administered 2019-04-04: 500 [IU]
  Filled 2019-04-04: qty 5

## 2019-04-04 MED ORDER — SODIUM CHLORIDE 0.9 % IV SOLN
1000.0000 mg | Freq: Once | INTRAVENOUS | Status: AC
  Administered 2019-04-04: 1000 mg via INTRAVENOUS
  Filled 2019-04-04: qty 20

## 2019-04-04 NOTE — Progress Notes (Signed)
Patient stated that he had been doing ok. Patient stated that last week he got real dizzy and fell and scrapped his left elbow. Patient also stated that he continues to have a non-productive cough. Patient stated that he stays weak and tired.

## 2019-04-17 ENCOUNTER — Telehealth: Payer: Self-pay | Admitting: Internal Medicine

## 2019-04-17 ENCOUNTER — Other Ambulatory Visit: Payer: Self-pay

## 2019-04-17 NOTE — Telephone Encounter (Signed)
Called patient for COVID-19 pre-screening for in office visit.  Have you recently traveled any where out of the local area in the last 2 weeks? No Have you been in close contact with a person diagnosed with COVID-19 within the last 2 weeks? No Do you currently have any of the following symptoms? If so, when did they start? Cough     Diarrhea  Joint Pain Fever      Muscle Pain  Red eyes Shortness of breath   Abdominal pain Vomiting Loss of smell    Rash   Sore Throat Headache    Weakness Bruising or bleeding   Okay to proceed with visit 04/19/2019

## 2019-04-18 ENCOUNTER — Inpatient Hospital Stay: Payer: BC Managed Care – PPO

## 2019-04-18 ENCOUNTER — Other Ambulatory Visit: Payer: Self-pay

## 2019-04-18 VITALS — BP 132/81 | HR 92 | Temp 95.6°F | Wt 213.0 lb

## 2019-04-18 DIAGNOSIS — C3491 Malignant neoplasm of unspecified part of right bronchus or lung: Secondary | ICD-10-CM

## 2019-04-18 DIAGNOSIS — C342 Malignant neoplasm of middle lobe, bronchus or lung: Secondary | ICD-10-CM | POA: Diagnosis not present

## 2019-04-18 MED ORDER — SODIUM CHLORIDE 0.9 % IV SOLN
1000.0000 mg | Freq: Once | INTRAVENOUS | Status: AC
  Administered 2019-04-18: 1000 mg via INTRAVENOUS
  Filled 2019-04-18: qty 20

## 2019-04-18 MED ORDER — SODIUM CHLORIDE 0.9 % IV SOLN
Freq: Once | INTRAVENOUS | Status: AC
  Administered 2019-04-18: 09:00:00 via INTRAVENOUS
  Filled 2019-04-18: qty 250

## 2019-04-18 MED ORDER — DIPHENHYDRAMINE HCL 50 MG/ML IJ SOLN
25.0000 mg | Freq: Once | INTRAMUSCULAR | Status: AC
  Administered 2019-04-18: 25 mg via INTRAVENOUS
  Filled 2019-04-18: qty 1

## 2019-04-18 MED ORDER — SODIUM CHLORIDE 0.9% FLUSH
10.0000 mL | INTRAVENOUS | Status: DC | PRN
  Administered 2019-04-18: 10 mL
  Filled 2019-04-18: qty 10

## 2019-04-18 MED ORDER — DEXAMETHASONE SODIUM PHOSPHATE 10 MG/ML IJ SOLN
10.0000 mg | Freq: Once | INTRAMUSCULAR | Status: AC
  Administered 2019-04-18: 10 mg via INTRAVENOUS
  Filled 2019-04-18: qty 1

## 2019-04-18 MED ORDER — HEPARIN SOD (PORK) LOCK FLUSH 100 UNIT/ML IV SOLN
500.0000 [IU] | Freq: Once | INTRAVENOUS | Status: AC | PRN
  Administered 2019-04-18: 500 [IU]
  Filled 2019-04-18: qty 5

## 2019-04-19 ENCOUNTER — Encounter: Payer: Self-pay | Admitting: Internal Medicine

## 2019-04-19 ENCOUNTER — Ambulatory Visit: Payer: BC Managed Care – PPO | Admitting: Internal Medicine

## 2019-04-19 VITALS — BP 138/78 | HR 118 | Temp 98.7°F | Ht 72.0 in | Wt 217.0 lb

## 2019-04-19 DIAGNOSIS — J9611 Chronic respiratory failure with hypoxia: Secondary | ICD-10-CM

## 2019-04-19 DIAGNOSIS — J449 Chronic obstructive pulmonary disease, unspecified: Secondary | ICD-10-CM | POA: Diagnosis not present

## 2019-04-19 DIAGNOSIS — F17210 Nicotine dependence, cigarettes, uncomplicated: Secondary | ICD-10-CM | POA: Diagnosis not present

## 2019-04-19 NOTE — Progress Notes (Signed)
[facility] Consultation      MRN# [id number] Paul Carpenter 04-27-1956   CC: Follow up COPD   Brief History: Synopsis: Paul Carpenter first saw the [facility] pulmonary clinic in July of 2014 for COPD. Simple spirometry showed a ratio 62%, FEV1 of 2.41 L (60% predicted). He had smoked 2 packs of cigarettes daily since his teenage years and continues to smoke. He has had multiple sinus infections and 2 sinus surgeries in the past.. LUL lung lesion negative for malignancy based on ENB 06/2016. 04/2017 PFT's shows ratio 62 with Fev1 68% 2.6L with air trapping and hyperinflation  Mass of upper lobe of left lung ENB of LUL lesion negative for malignant cell in 06/2016. Repeat CT chest in 08/2016 with RUL small scarring lesion, this is new.  Had another CTA Chest 09/09/16 for severe dyspnea - no PE, but LUL PNA CT chest 03/2017- -Apical right upper lobe 5 mm pulmonary nodule stable back to 03/14/2015 - 6 mm apical right upper lobe pulmonary nodule stable since at least 03/22/2016.  -tree-in-bud type opacity in the anterior right middle lobe is stable since 08/23/2016 -Apical left upper lobe 9 x 5 mm  nodule is stable since at least 03/22/2016  - clinical monitoring at this time-follow up CT chest in 6-12 months  CT chest Mar 23, 2019 reviewed by me today CT scan results from Mar 23, 2019 reviewed independently with stable disease and no evidence of recurrence or progression.    MRI of the brain completed on Mar 28, 2018 reviewed independently did not reveal metastatic disease CHF: Patient has an EF of 40%.  Continue evaluation and treatment per cardiology    HPI Follow-up COPD seems to be stable Patient uses intermittent oxygen at nighttime Currently on Anoro Which seems to be helping his breathing  Patient continues to smoke  smoking cessation counseling provided  No signs of infection at this time No signs of exacerbation at this time  Paul Carpenter was  positive for hypoxia  CT chest reviewed with patient stable findings follow with oncology as scheduled    Smoking Assessment and Cessation Counseling   Upon further questioning, Patient smokes 1 ppd  I have advised patient to quit/stop smoking as soon as possible due to high risk for multiple medical problems  Patient  is NOT willing to quit smoking  I have advised patient that we can assist and have options of Nicotine replacement therapy. I also advised patient on behavioral therapy and can provide oral medication therapy in conjunction with the other therapies  Follow up next Office visit  for assessment of smoking cessation  Smoking cessation counseling advised for 4 minutes        Current Outpatient Medications:  .  acetaminophen (TYLENOL) 500 MG tablet, Take 1,000 mg by mouth every 6 (six) hours as needed (for pain.)., Disp: , Rfl:  .  albuterol (PROAIR HFA) 108 (90 Base) MCG/ACT inhaler, Inhale 2 puffs into the lungs every 4 (four) hours as needed for wheezing or shortness of breath., Disp: 1 Inhaler, Rfl: 1 .  atorvastatin (LIPITOR) 40 MG tablet, Take 40 mg by mouth at bedtime. , Disp: , Rfl:  .  busPIRone (BUSPAR) 15 MG tablet, Take 15 mg by mouth 2 (two) times daily. , Disp: , Rfl: 10 .  diazepam (VALIUM) 5 MG tablet, Take 2 tablets (10 mg total) by mouth every 12 (twelve) hours as needed for anxiety., Disp: 60 tablet, Rfl: 1 .  DULoxetine (CYMBALTA) 60  MG capsule, Take 1 capsule by mouth 1 day or 1 dose., Disp: , Rfl:  .  fluticasone (FLONASE) 50 MCG/ACT nasal spray, Place 1 spray into both nostrils daily as needed (for allergies.). , Disp: , Rfl:  .  ipratropium-albuterol (DUONEB) 0.5-2.5 (3) MG/3ML SOLN, Take 3 mLs by nebulization every 6 (six) hours., Disp: 360 mL, Rfl: 5 .  levothyroxine (SYNTHROID) 25 MCG tablet, Take 1 tablet (25 mcg total) by mouth daily before breakfast., Disp: 30 tablet, Rfl: 1 .  lidocaine (XYLOCAINE) 2 % solution, , Disp: , Rfl: 2 .   metoprolol tartrate (LOPRESSOR) 25 MG tablet, Take 25 mg by mouth 2 (two) times daily., Disp: , Rfl:  .  Multiple Vitamin (MULTIVITAMIN WITH MINERALS) TABS tablet, Take 1 tablet by mouth daily. , Disp: , Rfl:  .  MYRBETRIQ 50 MG TB24 tablet, TAKE 1 TABLET BY MOUTH EVERY DAY, Disp: 90 tablet, Rfl: 3 .  ondansetron (ZOFRAN) 8 MG tablet, Take 8 mg by mouth every 8 (eight) hours as needed for nausea or vomiting., Disp: , Rfl:  .  OXYGEN, Inhale 2 L into the lungs at bedtime. , Disp: , Rfl:  .  pantoprazole (PROTONIX) 40 MG tablet, Take 40 mg by mouth daily., Disp: , Rfl: 3 .  potassium chloride (K-DUR) 10 MEQ tablet, TAKE 1 TABLET (10 MEQ TOTAL) BY MOUTH ONCE DAILY., Disp: , Rfl: 3 .  umeclidinium-vilanterol (ANORO ELLIPTA) 62.5-25 MCG/INH AEPB, Inhale 1 puff into the lungs daily., Disp: 1 each, Rfl: 10 .  nitroGLYCERIN (NITROSTAT) 0.4 MG SL tablet, Place 0.4 mg under the tongue every 5 (five) minutes as needed for chest pain. , Disp: , Rfl:  No current facility-administered medications for this visit.   Facility-Administered Medications Ordered in Other Visits:  .  heparin lock flush 100 unit/mL, 500 Units, Intravenous, Once, [name], Kathlene November, MD   Review of Systems  Constitutional: Positive for malaise/fatigue. Negative for chills and fever.  Eyes: Negative for blurred vision.  Respiratory: Positive for shortness of breath. Negative for cough, hemoptysis, sputum production and wheezing.   Cardiovascular: Negative for chest pain.  Gastrointestinal: Negative for heartburn, nausea and vomiting.  Skin: Negative for rash.   OTHER ROS NEGATIVE   Allergies:  Lisinopril and Varenicline  There were no vitals taken for this visit.  Physical Examination:   GENERAL:NAD, no fevers, chills, no weakness no fatigue HEAD: Normocephalic, atraumatic.  EYES: PERLA, EOMI No scleral icterus.  MOUTH: Moist mucosal membrane.  EAR, NOSE, THROAT: Clear without exudates. No external lesions.  NECK:  Supple. No thyromegaly.  No JVD.  PULMONARY: CTA B/L no wheezing, rhonchi, crackles CARDIOVASCULAR: S1 and S2. Regular rate and rhythm. No murmurs GASTROINTESTINAL: Soft, nontender, nondistended. Positive bowel sounds.  MUSCULOSKELETAL: No swelling, clubbing, or edema.  NEUROLOGIC: No gross focal neurological deficits. 5/5 strength all extremities SKIN: No ulceration, lesions, rashes, or cyanosis.  PSYCHIATRIC: Insight, judgment intact. -depression -anxiety ALL OTHER ROS ARE NEGATIVE           Assessment and Plan:  63 year old pleasant white male seen today for moderate to severe COPD Gold stage C with chronic hypoxic respiratory failure from his COPD with a history of non-small cell lung cancer with ongoing tobacco abuse status post chemo and radiation therapy  Most of breath and dyspnea on exertion related to COPD and deconditioned state and ongoing tobacco abuse   Moderate COPD Gold stage C Smoking cessation strongly advised Continue Anoro as prescribed Albuterol as needed No signs of exacerbation at this  time  Metastatic non-small cell lung cancer CT scans reviewed with patient There is no progression of disease at this time Follow-up oncology as scheduled  CHF EF 40% Follow-up neurology as scheduled  Cessation strongly advised  Hypoxic respiratory failure from COPD Patient needs this for survival I have advised patient to use this at night as prescribed  COVID-19 EDUCATION: The signs and symptoms of COVID-19 were discussed with the patient and how to seek care for testing.  The importance of social distancing was discussed today. Hand Washing Techniques and avoid touching face was advised.  MEDICATION ADJUSTMENTS/LABS AND TESTS ORDERED: None  CURRENT MEDICATIONS REVIEWED AT LENGTH WITH PATIENT TODAY   Patient/Family are satisfied with Plan of action and management. All questions answered Follow up in 6 months   Cathy Ropp [name], M.D.  [facility]  Medical Director [place] Director [facility] Cardio[facility] Department             I

## 2019-04-19 NOTE — Patient Instructions (Signed)
Continue inhalers as prescribed Continue oxygen as prescribed  Follow up with Cardiology  Follow up Oncology  STOP SMOKING!!

## 2019-04-24 ENCOUNTER — Encounter: Payer: Self-pay | Admitting: *Deleted

## 2019-04-24 ENCOUNTER — Other Ambulatory Visit: Payer: Self-pay | Admitting: *Deleted

## 2019-04-24 MED ORDER — DIAZEPAM 5 MG PO TABS: 10.0000 mg | ORAL_TABLET | Freq: Two times a day (BID) | ORAL | 1 refills | Status: DC | PRN

## 2019-04-24 NOTE — Progress Notes (Signed)
Entered in error

## 2019-04-26 ENCOUNTER — Other Ambulatory Visit: Payer: Self-pay | Admitting: Oncology

## 2019-04-29 NOTE — Progress Notes (Signed)
[place]  [phone] [phone]  ID: Paul Carpenter OB: [birth year]  MR#: [id number]  BZJ#:[id number]  Patient Care Team: Kirk Ruths, MD as PCP - General (Internal Medicine)  CHIEF COMPLAINT: Stage IIIa non-small cell lung cancer, right middle lobe.  INTERVAL HISTORY: Patient returns to clinic today for further evaluation and consideration of cycle 22 of maintenance durvalumab.  He continues to have chronic weakness and fatigue as well as a chronic cough, but otherwise feels well.  He continues to be highly anxious. He has no neurologic complaints.  He denies any recent fevers or illnesses.  He does not complain of dysphasia today.  He denies any chest pain, shortness of breath, or hemoptysis.  He denies any nausea, vomiting, constipation, or diarrhea. He has no urinary complaints.  Patient offers no further specific complaints today.  REVIEW OF SYSTEMS:   Review of Systems  Constitutional: Positive for malaise/fatigue. Negative for fever and weight loss.  HENT: Negative.  Negative for congestion and sore throat.   Respiratory: Positive for cough. Negative for hemoptysis and shortness of breath.   Cardiovascular: Negative.  Negative for chest pain and leg swelling.  Gastrointestinal: Negative.  Negative for abdominal pain, blood in stool and melena.  Genitourinary: Negative.  Negative for dysuria.  Musculoskeletal: Negative.  Negative for joint pain.  Skin: Negative.  Negative for rash.  Neurological: Positive for weakness. Negative for sensory change, focal weakness and headaches.  Psychiatric/Behavioral: The patient is nervous/anxious.     As per HPI. Otherwise, a complete review of systems is negative.  PAST MEDICAL HISTORY: Past Medical History:  Diagnosis Date   Anginal pain ([place])    Anxiety    Chest pain    CHF (congestive heart failure) ([facility])    Chicken pox    Complication of anesthesia    o2 dropped after neck  fusion   COPD (chronic obstructive pulmonary disease) ([facility])    Coronary artery disease    Cough    chronic  clear phlegm   Dysrhythmia    palpitations   GERD (gastroesophageal reflux disease)    h/o reflux/ hoarsness   Hematochezia    Hemorrhoids    History of chickenpox    History of colon polyps    History of Helicobacter pylori infection    Hoarseness    Hypertension    Lung cancer ([place]) 05/2016   Chemo + rad tx's.    Migraines    OSA (obstructive sleep apnea)    has CPAP but does not use   Personal history of tobacco use, presenting hazards to health 03/05/2016   Pneumonia    5/17   Raynaud disease    Raynaud disease    Raynaud's disease    Rotator cuff tear    on right   Shortness of breath dyspnea    Sleep apnea    Ulcer (traumatic) of oral mucosa     PAST SURGICAL HISTORY: Past Surgical History:  Procedure Laterality Date   BACK SURGERY     cervical fusion x 2   CARDIAC CATHETERIZATION     CERVICAL DISCECTOMY     x 2   COLONOSCOPY     COLONOSCOPY N/A 07/25/2015   Procedure: COLONOSCOPY;  Surgeon: Lollie Sails, MD;  Location: [facility] ENDOSCOPY;  Service: Endoscopy;  Laterality: N/A;   COLONOSCOPY WITH PROPOFOL N/A 10/04/2017   Procedure: COLONOSCOPY WITH PROPOFOL;  Surgeon: Lollie Sails, MD;  Location: [facility] ENDOSCOPY;  Service: Endoscopy;  Laterality: N/A;  ELECTROMAGNETIC NAVIGATION BROCHOSCOPY Left 06/28/2016   Procedure: ELECTROMAGNETIC NAVIGATION BRONCHOSCOPY;  Surgeon: Vilinda Boehringer, MD;  Location: [facility] ORS;  Service: Cardiopulmonary;  Laterality: Left;   ENDOBRONCHIAL ULTRASOUND N/A 04/11/2018   Procedure: ENDOBRONCHIAL ULTRASOUND;  Surgeon: [name], MD;  Location: [facility] ORS;  Service: Cardiopulmonary;  Laterality: N/A;   ESOPHAGOGASTRODUODENOSCOPY N/A 07/25/2015   Procedure: ESOPHAGOGASTRODUODENOSCOPY (EGD);  Surgeon: Lollie Sails, MD;  Location: [facility] ENDOSCOPY;  Service: Endoscopy;  Laterality: N/A;    NASAL SINUS SURGERY     x 2    PORTA CATH INSERTION N/A 04/24/2018   Procedure: PORTA CATH INSERTION;  Surgeon: Algernon Huxley, MD;  Location: [facility];  Service: Cardiovascular;  Laterality: N/A;   rotator cuff surgery Right    07/2016   SEPTOPLASTY     SKIN GRAFT      FAMILY HISTORY Family History  Problem Relation Age of Onset   Heart disease Father    Prostate cancer Father    Heart disease Paternal Grandmother    Heart attack Maternal Grandfather 40   Kidney cancer Neg Hx    Bladder Cancer Neg Hx    Other Neg Hx        pituitary abnormality       ADVANCED DIRECTIVES:    HEALTH MAINTENANCE: Social History   Tobacco Use   Smoking status: Current Every Day Smoker    Packs/day: 1.50    Years: 45.00    Pack years: 67.50    Types: Cigarettes   Smokeless tobacco: Never Used  Substance Use Topics   Alcohol use: Yes    Alcohol/week: 2.0 standard drinks    Types: 2 Standard drinks or equivalent per week    Comment: moderate   Drug use: No     Allergies  Allergen Reactions   Lisinopril Rash   Varenicline Rash    Current Outpatient Medications  Medication Sig Dispense Refill   acetaminophen (TYLENOL) 500 MG tablet Take 1,000 mg by mouth every 6 (six) hours as needed (for pain.).     albuterol (PROAIR HFA) 108 (90 Base) MCG/ACT inhaler Inhale 2 puffs into the lungs every 4 (four) hours as needed for wheezing or shortness of breath. 1 Inhaler 1   atorvastatin (LIPITOR) 40 MG tablet Take 40 mg by mouth at bedtime.      busPIRone (BUSPAR) 15 MG tablet Take 15 mg by mouth 2 (two) times daily.   10   diazepam (VALIUM) 5 MG tablet Take 2 tablets (10 mg total) by mouth every 12 (twelve) hours as needed for anxiety. 60 tablet 1   DULoxetine (CYMBALTA) 60 MG capsule Take 1 capsule by mouth 1 day or 1 dose.     fluticasone (FLONASE) 50 MCG/ACT nasal spray Place 1 spray into both nostrils daily as needed (for allergies.).       ipratropium-albuterol (DUONEB) 0.5-2.5 (3) MG/3ML SOLN Take 3 mLs by nebulization every 6 (six) hours. 360 mL 5   levothyroxine (SYNTHROID) 25 MCG tablet TAKE 1 TABLET (25 MCG TOTAL) BY MOUTH DAILY BEFORE BREAKFAST. 30 tablet 1   metoprolol succinate (TOPROL-XL) 25 MG 24 hr tablet Take 25 mg by mouth daily.     Multiple Vitamin (MULTIVITAMIN WITH MINERALS) TABS tablet Take 1 tablet by mouth daily.      MYRBETRIQ 50 MG TB24 tablet TAKE 1 TABLET BY MOUTH EVERY DAY 90 tablet 3   nitroGLYCERIN (NITROSTAT) 0.4 MG SL tablet Place 0.4 mg under the tongue every 5 (five) minutes as needed for  chest pain.      ondansetron (ZOFRAN) 8 MG tablet Take 8 mg by mouth every 8 (eight) hours as needed for nausea or vomiting.     OXYGEN Inhale 2 L into the lungs at bedtime.      pantoprazole (PROTONIX) 40 MG tablet Take 40 mg by mouth daily.  3   potassium chloride (K-DUR) 10 MEQ tablet TAKE 1 TABLET (10 MEQ TOTAL) BY MOUTH ONCE DAILY.  3   umeclidinium-vilanterol (ANORO ELLIPTA) 62.5-25 MCG/INH AEPB Inhale 1 puff into the lungs daily. 1 each 10   lidocaine (XYLOCAINE) 2 % solution   2   No current facility-administered medications for this visit.    Facility-Administered Medications Ordered in Other Visits  Medication Dose Route Frequency Provider Last Rate Last Dose   heparin lock flush 100 unit/mL  500 Units Intravenous Once [name], MD        OBJECTIVE: Vitals:   05/02/19 1051  BP: 128/81  Pulse: 89  Resp: 18  Temp: 98 F (36.7 C)     Body mass index is 28.62 kg/m.    ECOG FS:0 - Asymptomatic  General: Well-developed, well-nourished, no acute distress. Eyes: Pink conjunctiva, anicteric sclera. HEENT: Normocephalic, moist mucous membranes. Lungs: Clear to auscultation bilaterally. Heart: Regular rate and rhythm. No rubs, murmurs, or gallops. Abdomen: Soft, nontender, nondistended. No organomegaly noted, normoactive bowel sounds. Musculoskeletal: No edema, cyanosis, or  clubbing. Neuro: Alert, answering all questions appropriately. Cranial nerves grossly intact. Skin: No rashes or petechiae noted. Psych: Normal affect.  LAB RESULTS:  Lab Results  Component Value Date   NA 133 (L) 04/30/2019   K 3.9 04/30/2019   CL 99 04/30/2019   CO2 23 04/30/2019   GLUCOSE 166 (H) 04/30/2019   BUN 8 04/30/2019   CREATININE 1.01 04/30/2019   CALCIUM 9.1 04/30/2019   PROT 7.1 04/30/2019   ALBUMIN 4.1 04/30/2019   AST 30 04/30/2019   ALT 21 04/30/2019   ALKPHOS 102 04/30/2019   BILITOT 0.5 04/30/2019   GFRNONAA >60 04/30/2019   GFRAA >60 04/30/2019    Lab Results  Component Value Date   WBC 8.0 04/30/2019   NEUTROABS 6.4 04/30/2019   HGB 15.0 04/30/2019   HCT 43.1 04/30/2019   MCV 93.5 04/30/2019   PLT 190 04/30/2019     STUDIES:  ASSESSMENT: Stage IIIa non-small cell lung cancer, right middle lobe.  PLAN:    1. Stage IIIa non-small cell lung cancer, right middle lobe: Case discussed with pathologist and unable to determine whether this is adenocarcinoma or squamous cell carcinoma.  There is also insufficient tissue to do further testing.  Liquid biopsy did not reveal any actionable mutations.  CT scan results from Mar 23, 2019 reviewed independently with stable disease and no evidence of recurrence or progression. MRI of the brain completed on Mar 28, 2018 reviewed independently did not reveal metastatic disease.  Patient completed XRT June 26, 2018.  He completed his concurrent single agent carboplatinum on June 21, 2018.  Patient had a reaction to Taxol during cycle 1 and this was discontinued.  Patient initiated maintenance durvalumab on July 12, 2018.  Proceed with cycle 22 of maintenance treatment today.  Return to clinic in 2 weeks for treatment only and then in 4 weeks for further evaluation and consideration of cycle 24.  Plan to reimage at the conclusion of his maintenance treatments in October 2020.   2.  Secondary polycythemia:  Resolved.  Likely secondary to heavy tobacco use.  Previously, the remainder of his laboratory work, including JAK-2 mutation, was either negative or within normal limits.  3.  Right upper lobe pulmonary nodule: CT scan results as above.  Proceed with treatment as planned.  4.  Colon polyps:  Patient has a personal history of greater then 10 adenomatous polyps on his most recent conoloscopy. He does not know of any family history of increased polyps or colon cancer.  Genetic testing to assess for the APC mutation for FAP or AFAP was negative. Continue colonoscopies as per GI. 5. Tobacco Use: Patient continues to heavily smoke.  He expressed understanding by continuing tobacco use increases his chance of recurrence. 6. Anxiety: Chronic.  Continue IV Benadryl with each treatment.  Continue Valium 10 mg every 12 hours as needed.  Continue treatment and evaluation per primary care. 7.  Hyponatremia: Mild, monitor. 8.  Hypothyroidism: Patient has a increased TSH with a borderline low free thyroxine index, continue 25 mcg Synthroid daily. 9.  CHF: Patient has an EF of 40%.  Continue evaluation and treatment per cardiology. 10.  Weakness and fatigue: Multifactorial.  Appreciate Occupational Therapy input.  Patient has requested in-home physical therapy.   Patient expressed understanding and was in agreement with this plan. He also understands that He can call clinic at any time with any questions, concerns, or complaints.    [name], MD   05/03/2019 6:39 AM

## 2019-04-30 ENCOUNTER — Other Ambulatory Visit: Payer: Self-pay

## 2019-04-30 ENCOUNTER — Inpatient Hospital Stay: Payer: BC Managed Care – PPO

## 2019-04-30 DIAGNOSIS — C3491 Malignant neoplasm of unspecified part of right bronchus or lung: Secondary | ICD-10-CM

## 2019-04-30 DIAGNOSIS — C342 Malignant neoplasm of middle lobe, bronchus or lung: Secondary | ICD-10-CM | POA: Diagnosis not present

## 2019-04-30 LAB — CBC WITH DIFFERENTIAL/PLATELET
Abs Immature Granulocytes: 0.03 10*3/uL (ref 0.00–0.07)
Basophils Absolute: 0 10*3/uL (ref 0.0–0.1)
Basophils Relative: 1 %
Eosinophils Absolute: 0.1 10*3/uL (ref 0.0–0.5)
Eosinophils Relative: 2 %
HCT: 43.1 % (ref 39.0–52.0)
Hemoglobin: 15 g/dL (ref 13.0–17.0)
Immature Granulocytes: 0 %
Lymphocytes Relative: 11 %
Lymphs Abs: 0.8 10*3/uL (ref 0.7–4.0)
MCH: 32.5 pg (ref 26.0–34.0)
MCHC: 34.8 g/dL (ref 30.0–36.0)
MCV: 93.5 fL (ref 80.0–100.0)
Monocytes Absolute: 0.6 10*3/uL (ref 0.1–1.0)
Monocytes Relative: 8 %
Neutro Abs: 6.4 10*3/uL (ref 1.7–7.7)
Neutrophils Relative %: 78 %
Platelets: 190 10*3/uL (ref 150–400)
RBC: 4.61 MIL/uL (ref 4.22–5.81)
RDW: 12.5 % (ref 11.5–15.5)
WBC: 8 10*3/uL (ref 4.0–10.5)
nRBC: 0 % (ref 0.0–0.2)

## 2019-04-30 LAB — COMPREHENSIVE METABOLIC PANEL
ALT: 21 U/L (ref 0–44)
AST: 30 U/L (ref 15–41)
Albumin: 4.1 g/dL (ref 3.5–5.0)
Alkaline Phosphatase: 102 U/L (ref 38–126)
Anion gap: 11 (ref 5–15)
BUN: 8 mg/dL (ref 8–23)
CO2: 23 mmol/L (ref 22–32)
Calcium: 9.1 mg/dL (ref 8.9–10.3)
Chloride: 99 mmol/L (ref 98–111)
Creatinine, Ser: 1.01 mg/dL (ref 0.61–1.24)
GFR calc Af Amer: >60 mL/min (ref >60)
GFR calc non Af Amer: >60 mL/min (ref >60)
Glucose, Bld: 166 mg/dL — ABNORMAL HIGH (ref 70–99)
Potassium: 3.9 mmol/L (ref 3.5–5.1)
Sodium: 133 mmol/L — ABNORMAL LOW (ref 135–145)
Total Bilirubin: 0.5 mg/dL (ref 0.3–1.2)
Total Protein: 7.1 g/dL (ref 6.5–8.1)

## 2019-05-01 ENCOUNTER — Other Ambulatory Visit: Payer: Self-pay

## 2019-05-01 LAB — THYROID PANEL WITH TSH
Free Thyroxine Index: 1.4 (ref 1.2–4.9)
T3 Uptake Ratio: 21 % — ABNORMAL LOW (ref 24–39)
T4, Total: 6.8 ug/dL (ref 4.5–12.0)
TSH: 13.05 u[IU]/mL — ABNORMAL HIGH (ref 0.450–4.500)

## 2019-05-02 ENCOUNTER — Other Ambulatory Visit: Payer: BC Managed Care – PPO

## 2019-05-02 ENCOUNTER — Inpatient Hospital Stay: Payer: BC Managed Care – PPO | Attending: Oncology | Admitting: Oncology

## 2019-05-02 ENCOUNTER — Inpatient Hospital Stay: Payer: BC Managed Care – PPO | Admitting: Occupational Therapy

## 2019-05-02 ENCOUNTER — Encounter: Payer: Self-pay | Admitting: Oncology

## 2019-05-02 ENCOUNTER — Inpatient Hospital Stay: Payer: BC Managed Care – PPO

## 2019-05-02 VITALS — BP 128/81 | HR 89 | Temp 98.0°F | Resp 18 | Wt 211.0 lb

## 2019-05-02 DIAGNOSIS — Z86010 Personal history of colonic polyps: Secondary | ICD-10-CM | POA: Insufficient documentation

## 2019-05-02 DIAGNOSIS — Z923 Personal history of irradiation: Secondary | ICD-10-CM | POA: Diagnosis not present

## 2019-05-02 DIAGNOSIS — F17210 Nicotine dependence, cigarettes, uncomplicated: Secondary | ICD-10-CM

## 2019-05-02 DIAGNOSIS — C342 Malignant neoplasm of middle lobe, bronchus or lung: Secondary | ICD-10-CM | POA: Diagnosis present

## 2019-05-02 DIAGNOSIS — I110 Hypertensive heart disease with heart failure: Secondary | ICD-10-CM

## 2019-05-02 DIAGNOSIS — F419 Anxiety disorder, unspecified: Secondary | ICD-10-CM | POA: Diagnosis not present

## 2019-05-02 DIAGNOSIS — Z5112 Encounter for antineoplastic immunotherapy: Secondary | ICD-10-CM | POA: Insufficient documentation

## 2019-05-02 DIAGNOSIS — Z79899 Other long term (current) drug therapy: Secondary | ICD-10-CM | POA: Diagnosis not present

## 2019-05-02 DIAGNOSIS — I509 Heart failure, unspecified: Secondary | ICD-10-CM | POA: Insufficient documentation

## 2019-05-02 DIAGNOSIS — E039 Hypothyroidism, unspecified: Secondary | ICD-10-CM

## 2019-05-02 DIAGNOSIS — C3491 Malignant neoplasm of unspecified part of right bronchus or lung: Secondary | ICD-10-CM

## 2019-05-02 DIAGNOSIS — J449 Chronic obstructive pulmonary disease, unspecified: Secondary | ICD-10-CM

## 2019-05-02 DIAGNOSIS — E871 Hypo-osmolality and hyponatremia: Secondary | ICD-10-CM | POA: Insufficient documentation

## 2019-05-02 DIAGNOSIS — M6281 Muscle weakness (generalized): Secondary | ICD-10-CM

## 2019-05-02 DIAGNOSIS — G4733 Obstructive sleep apnea (adult) (pediatric): Secondary | ICD-10-CM

## 2019-05-02 MED ORDER — DEXAMETHASONE SODIUM PHOSPHATE 10 MG/ML IJ SOLN
10.0000 mg | Freq: Once | INTRAMUSCULAR | Status: AC
  Administered 2019-05-02: 10 mg via INTRAVENOUS
  Filled 2019-05-02: qty 1

## 2019-05-02 MED ORDER — SODIUM CHLORIDE 0.9% FLUSH
10.0000 mL | INTRAVENOUS | Status: DC | PRN
  Administered 2019-05-02: 10 mL via INTRAVENOUS
  Filled 2019-05-02: qty 10

## 2019-05-02 MED ORDER — HEPARIN SOD (PORK) LOCK FLUSH 100 UNIT/ML IV SOLN
500.0000 [IU] | Freq: Once | INTRAVENOUS | Status: AC
  Administered 2019-05-02: 500 [IU] via INTRAVENOUS

## 2019-05-02 MED ORDER — DIPHENHYDRAMINE HCL 50 MG/ML IJ SOLN
25.0000 mg | Freq: Once | INTRAMUSCULAR | Status: AC
  Administered 2019-05-02: 25 mg via INTRAVENOUS
  Filled 2019-05-02: qty 1

## 2019-05-02 MED ORDER — SODIUM CHLORIDE 0.9 % IV SOLN
1000.0000 mg | Freq: Once | INTRAVENOUS | Status: AC
  Administered 2019-05-02: 1000 mg via INTRAVENOUS
  Filled 2019-05-02: qty 20

## 2019-05-02 MED ORDER — SODIUM CHLORIDE 0.9 % IV SOLN
Freq: Once | INTRAVENOUS | Status: AC
  Administered 2019-05-02: 12:00:00 via INTRAVENOUS
  Filled 2019-05-02: qty 250

## 2019-05-02 NOTE — Progress Notes (Signed)
Blood return from port today.

## 2019-05-02 NOTE — Therapy (Signed)
[facility] [address] [place], [place], [place], [address] Phone: [phone]   Fax:  [phone]  Occupational Therapy Screen   Patient Details  Name: Paul Carpenter MRN: [id number] Date of Birth: Dec 27, 1955 No data recorded  Encounter Date: 05/02/2019    Past Medical History:  Diagnosis Date  . Anginal pain ([place])   . Anxiety   . Chest pain   . CHF (congestive heart failure) ([place])   . Chicken pox   . Complication of anesthesia    o2 dropped after neck fusion  . COPD (chronic obstructive pulmonary disease) ([place])   . Coronary artery disease   . Cough    chronic  clear phlegm  . Dysrhythmia    palpitations  . GERD (gastroesophageal reflux disease)    h/o reflux/ hoarsness  . Hematochezia   . Hemorrhoids   . History of chickenpox   . History of colon polyps   . History of Helicobacter pylori infection   . Hoarseness   . Hypertension   . Lung cancer ([place]) 05/2016   Chemo + rad tx's.   . Migraines   . OSA (obstructive sleep apnea)    has CPAP but does not use  . Personal history of tobacco use, presenting hazards to health 03/05/2016  . Pneumonia    5/17  . Raynaud disease   . Raynaud disease   . Raynaud's disease   . Rotator cuff tear    on right  . Shortness of breath dyspnea   . Sleep apnea   . Ulcer (traumatic) of oral mucosa     Past Surgical History:  Procedure Laterality Date  . BACK SURGERY     cervical fusion x 2  . CARDIAC CATHETERIZATION    . CERVICAL DISCECTOMY     x 2  . COLONOSCOPY    . COLONOSCOPY N/A 07/25/2015   Procedure: COLONOSCOPY;  Surgeon: Lollie Sails, MD;  Location: [facility] ENDOSCOPY;  Service: Endoscopy;  Laterality: N/A;  . COLONOSCOPY WITH PROPOFOL N/A 10/04/2017   Procedure: COLONOSCOPY WITH PROPOFOL;  Surgeon: Lollie Sails, MD;  Location[facility] ENDOSCOPY;  Service: Endoscopy;  Laterality: N/A;  . ELECTROMAGNETIC NAVIGATION BROCHOSCOPY Left 06/28/2016   Procedure:  ELECTROMAGNETIC NAVIGATION BRONCHOSCOPY;  Surgeon: Vilinda Boehringer, MD;  Location: [facility] ORS;  Service: Cardiopulmonary;  Laterality: Left;  . ENDOBRONCHIAL ULTRASOUND N/A 04/11/2018   Procedure: ENDOBRONCHIAL ULTRASOUND;  Surgeon: [name], MD;  Location: [facility] ORS;  Service: Cardiopulmonary;  Laterality: N/A;  . ESOPHAGOGASTRODUODENOSCOPY N/A 07/25/2015   Procedure: ESOPHAGOGASTRODUODENOSCOPY (EGD);  Surgeon: Lollie Sails, MD;  Location: [facility] ENDOSCOPY;  Service: Endoscopy;  Laterality: N/A;  . NASAL SINUS SURGERY     x 2   . PORTA CATH INSERTION N/A 04/24/2018   Procedure: PORTA CATH INSERTION;  Surgeon: Algernon Huxley, MD;  Location: [facility] CV LAB;  Service: Cardiovascular;  Laterality: N/A;  . rotator cuff surgery Right    07/2016  . SEPTOPLASTY    . SKIN GRAFT        Pt arrive this date  And report to OT that still fatigue, no energy - do get out in the yard with grandkids , and go to the office  But he had 2 -3 falls since seen for screen 07/2019  - on of them he trip over cord    Pt report he still  do not have the enery and edurance - he only goes in to the office now - not  anymore to job sites  Done [employer] screen with pt this date -and he scored 37/56 - putting him at medium risk for falls  Did talk with him about - that I recommend PT for balance and strength - but pt do not want to do it again at this time  Provided pt with some HEP for hip strength, sit <> stand , toes raises  And then with wife at his side can do tapping feet on step - one at time  If doing walking BW<> FW , Sideways - need to hold counter or wife with him   Written handout provided  For HEP  And also prevent fall and energy conservation hand out review and provided and pt to review it with his wife too     Will check on pt in 3 wks                                   Patient will benefit from skilled therapeutic intervention in order to improve the following  deficits and impairments:           Visit Diagnosis: 1. Muscle weakness (generalized)       Problem List Patient Active Problem List   Diagnosis Date Noted  . HFrEF (heart failure with reduced ejection fraction) ([place]) 03/06/2019  . Aortic atherosclerosis ([place]) 12/14/2018  . Non-small cell lung cancer, right ([place]) 04/24/2018  . Oral ulcer 02/16/2018  . Pituitary disorder ([place]) 02/16/2018  . Migraine headache 03/07/2017  . CAP (community acquired pneumonia) 09/11/2016  . COPD exacerbation ([place]) 09/11/2016  . Hyponatremia 09/11/2016  . Leukocytosis 09/11/2016  . Thrombocytopenia ([place]) 09/11/2016  . Sepsis ([place]) 09/09/2016  . Cigarette smoker 06/11/2016  . Cervical radiculopathy 04/15/2016  . 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 Impingement syndrome of right shoulder 03/09/2016  . Personal history of tobacco use, presenting hazards to health 03/05/2016  . Health care maintenance 09/29/2015  . 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  . Benign essential hypertension 05/28/2015  . Polycythemia 03/24/2015  . CAD (coronary artery disease) 12/12/2014  . Carotid artery disease ([place]) 12/12/2014  . Disequilibrium 12/12/2014  . Mixed hyperlipidemia 12/10/2014  . 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 headache (migraine equivalents) 08/12/2014  . Incomplete emptying of bladder 06/04/2014  . Anxiety 05/18/2014  . Chronic coronary artery disease 05/18/2014  . Chronic headaches 05/18/2014  . Acute shoulder pain 03/15/2014  . Impingement syndrome of left shoulder 03/15/2014  . Lung mass 12/06/2013  . Kidney stone 11/24/2013  . COPD (chronic obstructive pulmonary disease) ([place]) 04/24/2013  . Tobacco use disorder 04/24/2013  . Obstructive sleep apnea syndrome 04/24/2013  . Benign localized prostatic hyperplasia with lower urinary tract symptoms (LUTS) 07/04/2012  . Encounter for long-term current use of medication 07/04/2012  . ED (erectile dysfunction) of organic origin 07/04/2012   . Testicular hypofunction 07/04/2012    Rosalyn Gess OTR/L,CLT 05/02/2019, 11:51 AM  [facility] [address] [place], [place], [place], [address] Phone: [phone]   Fax:  [phone]  Name: SHAQUON GROPP MRN: [id number] Date of Birth: Mar 17, 1956

## 2019-05-02 NOTE — Progress Notes (Signed)
Pt reports getting weaker every week over the past month.  Pt reports no appetite but "I make myself eat".

## 2019-05-16 ENCOUNTER — Inpatient Hospital Stay: Payer: BC Managed Care – PPO

## 2019-05-16 ENCOUNTER — Other Ambulatory Visit: Payer: Self-pay

## 2019-05-16 VITALS — BP 123/82 | HR 100 | Temp 98.2°F | Resp 18

## 2019-05-16 DIAGNOSIS — C3491 Malignant neoplasm of unspecified part of right bronchus or lung: Secondary | ICD-10-CM

## 2019-05-16 DIAGNOSIS — C342 Malignant neoplasm of middle lobe, bronchus or lung: Secondary | ICD-10-CM | POA: Diagnosis not present

## 2019-05-16 MED ORDER — SODIUM CHLORIDE 0.9% FLUSH
10.0000 mL | INTRAVENOUS | Status: DC | PRN
  Administered 2019-05-16: 10 mL
  Filled 2019-05-16: qty 10

## 2019-05-16 MED ORDER — DEXAMETHASONE SODIUM PHOSPHATE 10 MG/ML IJ SOLN
10.0000 mg | Freq: Once | INTRAMUSCULAR | Status: AC
  Administered 2019-05-16: 10 mg via INTRAVENOUS
  Filled 2019-05-16: qty 1

## 2019-05-16 MED ORDER — HEPARIN SOD (PORK) LOCK FLUSH 100 UNIT/ML IV SOLN
500.0000 [IU] | Freq: Once | INTRAVENOUS | Status: AC | PRN
  Administered 2019-05-16: 500 [IU]
  Filled 2019-05-16: qty 5

## 2019-05-16 MED ORDER — SODIUM CHLORIDE 0.9 % IV SOLN
Freq: Once | INTRAVENOUS | Status: AC
  Administered 2019-05-16: 11:00:00 via INTRAVENOUS
  Filled 2019-05-16: qty 250

## 2019-05-16 MED ORDER — SODIUM CHLORIDE 0.9 % IV SOLN
1000.0000 mg | Freq: Once | INTRAVENOUS | Status: AC
  Administered 2019-05-16: 1000 mg via INTRAVENOUS
  Filled 2019-05-16: qty 20

## 2019-05-16 MED ORDER — DIPHENHYDRAMINE HCL 50 MG/ML IJ SOLN
25.0000 mg | Freq: Once | INTRAMUSCULAR | Status: AC
  Administered 2019-05-16: 25 mg via INTRAVENOUS
  Filled 2019-05-16: qty 1

## 2019-05-16 NOTE — Progress Notes (Addendum)
RN was unable to obtain blood return from pt's port today. Per pt, he has had problems in past. Pt reports no pain upon port flush and there are no signs of redness, swelling, infiltration at port site after flushing. Per Dr. Grayland Ormond, ok for pt to receive treatment through port today.

## 2019-05-22 ENCOUNTER — Telehealth: Payer: Self-pay | Admitting: *Deleted

## 2019-05-22 NOTE — Telephone Encounter (Signed)
Pt's wife called in to report that pt is having increased fatigue and more "bad days" between treatments than "good days". She noticed that his thyroid levels have been elevated the past few months and pt has been taking his thyroid medication as prescribed. Asks if pt needs a referral to endocrinology to help manage thyroid levels and medication.   Please advise.

## 2019-05-22 NOTE — Telephone Encounter (Signed)
Great! I will take care of it. Thx!

## 2019-05-22 NOTE — Telephone Encounter (Signed)
Yes, lets do an endocrine referral.  Thanks!

## 2019-05-25 ENCOUNTER — Other Ambulatory Visit: Payer: Self-pay

## 2019-05-27 NOTE — Progress Notes (Signed)
[facility]  [phone] [phone]  ID: [name] OB: 21-Apr-1956  MR#: [id number]  ZYS#:[id number]  Patient Care Team: Kirk Ruths, MD as PCP - General (Internal Medicine)  CHIEF COMPLAINT: Stage IIIa non-small cell lung cancer, right middle lobe.  INTERVAL HISTORY: Patient returns to clinic today for further evaluation and consideration of cycle 24 of maintenance durvalumab.  He continues to have chronic weakness and fatigue and feels this is getting worse.  He also has a chronic cough that is unchanged. He continues to be highly anxious. He has no neurologic complaints.  He denies any recent fevers or illnesses.  He does not complain of dysphasia today.  He denies any chest pain, shortness of breath, or hemoptysis.  He denies any nausea, vomiting, constipation, or diarrhea. He has no urinary complaints.  Patient offers no further specific complaints today.  REVIEW OF SYSTEMS:   Review of Systems  Constitutional: Positive for malaise/fatigue. Negative for fever and weight loss.  HENT: Negative.  Negative for congestion and sore throat.   Respiratory: Positive for cough. Negative for hemoptysis and shortness of breath.   Cardiovascular: Negative.  Negative for chest pain and leg swelling.  Gastrointestinal: Negative.  Negative for abdominal pain, blood in stool and melena.  Genitourinary: Negative.  Negative for dysuria.  Musculoskeletal: Negative.  Negative for joint pain.  Skin: Negative.  Negative for rash.  Neurological: Positive for weakness. Negative for sensory change, focal weakness and headaches.  Psychiatric/Behavioral: The patient is nervous/anxious.     As per HPI. Otherwise, a complete review of systems is negative.  PAST MEDICAL HISTORY: Past Medical History:  Diagnosis Date   Anginal pain ([place])    Anxiety    Chest pain    CHF (congestive heart failure) ([facility])    Chicken pox    Complication of anesthesia      o2 dropped after neck fusion   COPD (chronic obstructive pulmonary disease) ([facility])    Coronary artery disease    Cough    chronic  clear phlegm   Dysrhythmia    palpitations   GERD (gastroesophageal reflux disease)    h/o reflux/ hoarsness   Hematochezia    Hemorrhoids    History of chickenpox    History of colon polyps    History of Helicobacter pylori infection    Hoarseness    Hypertension    Lung cancer ([place]) 05/2016   Chemo + rad tx's.    Migraines    OSA (obstructive sleep apnea)    has CPAP but does not use   Personal history of tobacco use, presenting hazards to health 03/05/2016   Pneumonia    5/17   Raynaud disease    Raynaud disease    Raynaud's disease    Rotator cuff tear    on right   Shortness of breath dyspnea    Sleep apnea    Ulcer (traumatic) of oral mucosa     PAST SURGICAL HISTORY: Past Surgical History:  Procedure Laterality Date   BACK SURGERY     cervical fusion x 2   CARDIAC CATHETERIZATION     CERVICAL DISCECTOMY     x 2   COLONOSCOPY     COLONOSCOPY N/A 07/25/2015   Procedure: COLONOSCOPY;  Surgeon: Lollie Sails, MD;  Location: [facility] ENDOSCOPY;  Service: Endoscopy;  Laterality: N/A;   COLONOSCOPY WITH PROPOFOL N/A 10/04/2017   Procedure: COLONOSCOPY WITH PROPOFOL;  Surgeon: Lollie Sails, MD;  Location: [facility] ENDOSCOPY;  Service: Endoscopy;  Laterality: N/A;   ELECTROMAGNETIC NAVIGATION BROCHOSCOPY Left 06/28/2016   Procedure: ELECTROMAGNETIC NAVIGATION BRONCHOSCOPY;  Surgeon: Vilinda Boehringer, MD;  Location: [facility] ORS;  Service: Cardiopulmonary;  Laterality: Left;   ENDOBRONCHIAL ULTRASOUND N/A 04/11/2018   Procedure: ENDOBRONCHIAL ULTRASOUND;  Surgeon: [name], MD;  Location: [facility] ORS;  Service: Cardiopulmonary;  Laterality: N/A;   ESOPHAGOGASTRODUODENOSCOPY N/A 07/25/2015   Procedure: ESOPHAGOGASTRODUODENOSCOPY (EGD);  Surgeon: Lollie Sails, MD;  Location: [facility] ENDOSCOPY;  Service: Endoscopy;   Laterality: N/A;   NASAL SINUS SURGERY     x 2    PORTA CATH INSERTION N/A 04/24/2018   Procedure: PORTA CATH INSERTION;  Surgeon: Algernon Huxley, MD;  Location: [facility] CV LAB;  Service: Cardiovascular;  Laterality: N/A;   rotator cuff surgery Right    07/2016   SEPTOPLASTY     SKIN GRAFT      FAMILY HISTORY Family History  Problem Relation Age of Onset   Heart disease Father    Prostate cancer Father    Heart disease Paternal Grandmother    Heart attack Maternal Grandfather 46   Kidney cancer Neg Hx    Bladder Cancer Neg Hx    Other Neg Hx        pituitary abnormality       ADVANCED DIRECTIVES:    HEALTH MAINTENANCE: Social History   Tobacco Use   Smoking status: Current Every Day Smoker    Packs/day: 1.50    Years: 45.00    Pack years: 67.50    Types: Cigarettes   Smokeless tobacco: Never Used  Substance Use Topics   Alcohol use: Yes    Alcohol/week: 2.0 standard drinks    Types: 2 Standard drinks or equivalent per week    Comment: moderate   Drug use: No     Allergies  Allergen Reactions   Lisinopril Rash   Varenicline Rash    Current Outpatient Medications  Medication Sig Dispense Refill   acetaminophen (TYLENOL) 500 MG tablet Take 1,000 mg by mouth every 6 (six) hours as needed (for pain.).     albuterol (PROAIR HFA) 108 (90 Base) MCG/ACT inhaler Inhale 2 puffs into the lungs every 4 (four) hours as needed for wheezing or shortness of breath. 1 Inhaler 1   atorvastatin (LIPITOR) 40 MG tablet Take 40 mg by mouth at bedtime.      busPIRone (BUSPAR) 15 MG tablet Take 15 mg by mouth 2 (two) times daily.   10   diazepam (VALIUM) 5 MG tablet Take 2 tablets (10 mg total) by mouth every 12 (twelve) hours as needed for anxiety. 60 tablet 1   DULoxetine (CYMBALTA) 60 MG capsule Take 1 capsule by mouth 1 day or 1 dose.     fluticasone (FLONASE) 50 MCG/ACT nasal spray Place 1 spray into both nostrils daily as needed (for allergies.).       ipratropium-albuterol (DUONEB) 0.5-2.5 (3) MG/3ML SOLN Take 3 mLs by nebulization every 6 (six) hours. 360 mL 5   levothyroxine (SYNTHROID) 25 MCG tablet TAKE 1 TABLET (25 MCG TOTAL) BY MOUTH DAILY BEFORE BREAKFAST. 30 tablet 1   lidocaine (XYLOCAINE) 2 % solution   2   lidocaine-prilocaine (EMLA) cream Apply 1 application topically as needed.     metoprolol succinate (TOPROL-XL) 25 MG 24 hr tablet Take 25 mg by mouth daily.     Multiple Vitamin (MULTIVITAMIN WITH MINERALS) TABS tablet Take 1 tablet by mouth daily.      MYRBETRIQ 50 MG TB24 tablet TAKE  1 TABLET BY MOUTH EVERY DAY 90 tablet 3   ondansetron (ZOFRAN) 8 MG tablet Take 8 mg by mouth every 8 (eight) hours as needed for nausea or vomiting.     OXYGEN Inhale 2 L into the lungs at bedtime.      pantoprazole (PROTONIX) 40 MG tablet Take 40 mg by mouth daily.  3   potassium chloride (K-DUR) 10 MEQ tablet TAKE 1 TABLET (10 MEQ TOTAL) BY MOUTH ONCE DAILY.  3   umeclidinium-vilanterol (ANORO ELLIPTA) 62.5-25 MCG/INH AEPB Inhale 1 puff into the lungs daily. 1 each 10   nitroGLYCERIN (NITROSTAT) 0.4 MG SL tablet Place 0.4 mg under the tongue every 5 (five) minutes as needed for chest pain.      No current facility-administered medications for this visit.    Facility-Administered Medications Ordered in Other Visits  Medication Dose Route Frequency Provider Last Rate Last Dose   heparin lock flush 100 unit/mL  500 Units Intravenous Once [name], MD        OBJECTIVE: Vitals:   05/30/19 1038  BP: 117/80  Pulse: 94  Temp: (!) 96.3 F (35.7 C)     Body mass index is 29.02 kg/m.    ECOG FS:0 - Asymptomatic  General: Well-developed, well-nourished, no acute distress. Eyes: Pink conjunctiva, anicteric sclera. HEENT: Normocephalic, moist mucous membranes. Lungs: Clear to auscultation bilaterally. Heart: Regular rate and rhythm. No rubs, murmurs, or gallops. Abdomen: Soft, nontender, nondistended. No  organomegaly noted, normoactive bowel sounds. Musculoskeletal: No edema, cyanosis, or clubbing. Neuro: Alert, answering all questions appropriately. Cranial nerves grossly intact. Skin: No rashes or petechiae noted. Psych: Normal affect.  LAB RESULTS:  Lab Results  Component Value Date   NA 135 05/28/2019   K 3.9 05/28/2019   CL 100 05/28/2019   CO2 26 05/28/2019   GLUCOSE 117 (H) 05/28/2019   BUN 7 (L) 05/28/2019   CREATININE 0.77 05/28/2019   CALCIUM 9.3 05/28/2019   PROT 6.9 05/28/2019   ALBUMIN 4.0 05/28/2019   AST 25 05/28/2019   ALT 23 05/28/2019   ALKPHOS 95 05/28/2019   BILITOT 0.6 05/28/2019   GFRNONAA >60 05/28/2019   GFRAA >60 05/28/2019    Lab Results  Component Value Date   WBC 7.6 05/28/2019   NEUTROABS 5.9 05/28/2019   HGB 14.6 05/28/2019   HCT 41.4 05/28/2019   MCV 94.3 05/28/2019   PLT 183 05/28/2019     STUDIES:  ASSESSMENT: Stage IIIa non-small cell lung cancer, right middle lobe.  PLAN:    1. Stage IIIa non-small cell lung cancer, right middle lobe: Case discussed with pathologist and unable to determine whether this is adenocarcinoma or squamous cell carcinoma.  There is also insufficient tissue to do further testing.  Liquid biopsy did not reveal any actionable mutations.  CT scan results from Mar 23, 2019 reviewed independently with stable disease and no evidence of recurrence or progression. MRI of the brain completed on Mar 28, 2018 reviewed independently did not reveal metastatic disease.  Patient completed XRT June 26, 2018.  He completed his concurrent single agent carboplatinum on June 21, 2018.  Patient had a reaction to Taxol during cycle 1 and this was discontinued.  Patient initiated maintenance durvalumab on July 12, 2018.  Proceed with cycle 24 of maintenance durvalumab today.  Return to clinic in 2 weeks for treatment only and then in 4 weeks for further evaluation and consideration of cycle 26.  Plan to reimage at the  conclusion of his maintenance treatments  in October 2020.   2.  Secondary polycythemia: Resolved.  Likely secondary to heavy tobacco use.  Previously, the remainder of his laboratory work, including JAK-2 mutation, was either negative or within normal limits.  3.  Right upper lobe pulmonary nodule: CT scan results as above.  Proceed with treatment as planned.  4.  Colon polyps:  Patient has a personal history of greater then 10 adenomatous polyps on his most recent conoloscopy. He does not know of any family history of increased polyps or colon cancer.  Genetic testing to assess for the APC mutation for FAP or AFAP was negative. Continue colonoscopies as per GI. 5. Tobacco Use: Patient continues to heavily smoke.  He expressed understanding by continuing tobacco use increases his chance of recurrence. 6. Anxiety: Chronic.  Continue IV Benadryl with each treatment.  Continue Valium 10 mg every 12 hours as needed.  Continue treatment and evaluation per primary care. 7.  Hyponatremia: Chronic and unchanged.  Monitor. 8.  Hypothyroidism: Patient's TSH continues to trend up although his T4 remains within normal limits.  He has an appointment with endocrinology next week. 9.  CHF: Patient has an EF of 40%.  Continue evaluation and treatment per cardiology. 10.  Weakness and fatigue: Multifactorial.  Appreciate Occupational Therapy input.  Patient has requested in-home physical therapy.  Endocrinology as above.   Patient expressed understanding and was in agreement with this plan. He also understands that He can call clinic at any time with any questions, concerns, or complaints.    [name], MD   05/31/2019 9:50 AM

## 2019-05-28 ENCOUNTER — Other Ambulatory Visit: Payer: Self-pay

## 2019-05-28 ENCOUNTER — Inpatient Hospital Stay: Payer: BC Managed Care – PPO

## 2019-05-28 DIAGNOSIS — C342 Malignant neoplasm of middle lobe, bronchus or lung: Secondary | ICD-10-CM | POA: Diagnosis not present

## 2019-05-28 DIAGNOSIS — C3491 Malignant neoplasm of unspecified part of right bronchus or lung: Secondary | ICD-10-CM

## 2019-05-28 LAB — COMPREHENSIVE METABOLIC PANEL
ALT: 23 U/L (ref 0–44)
AST: 25 U/L (ref 15–41)
Albumin: 4 g/dL (ref 3.5–5.0)
Alkaline Phosphatase: 95 U/L (ref 38–126)
Anion gap: 9 (ref 5–15)
BUN: 7 mg/dL — ABNORMAL LOW (ref 8–23)
CO2: 26 mmol/L (ref 22–32)
Calcium: 9.3 mg/dL (ref 8.9–10.3)
Chloride: 100 mmol/L (ref 98–111)
Creatinine, Ser: 0.77 mg/dL (ref 0.61–1.24)
GFR calc Af Amer: >60 mL/min (ref >60)
GFR calc non Af Amer: >60 mL/min (ref >60)
Glucose, Bld: 117 mg/dL — ABNORMAL HIGH (ref 70–99)
Potassium: 3.9 mmol/L (ref 3.5–5.1)
Sodium: 135 mmol/L (ref 135–145)
Total Bilirubin: 0.6 mg/dL (ref 0.3–1.2)
Total Protein: 6.9 g/dL (ref 6.5–8.1)

## 2019-05-28 LAB — CBC WITH DIFFERENTIAL/PLATELET
Abs Immature Granulocytes: 0.03 10*3/uL (ref 0.00–0.07)
Basophils Absolute: 0.1 10*3/uL (ref 0.0–0.1)
Basophils Relative: 1 %
Eosinophils Absolute: 0.3 10*3/uL (ref 0.0–0.5)
Eosinophils Relative: 3 %
HCT: 41.4 % (ref 39.0–52.0)
Hemoglobin: 14.6 g/dL (ref 13.0–17.0)
Immature Granulocytes: 0 %
Lymphocytes Relative: 11 %
Lymphs Abs: 0.8 10*3/uL (ref 0.7–4.0)
MCH: 33.3 pg (ref 26.0–34.0)
MCHC: 35.3 g/dL (ref 30.0–36.0)
MCV: 94.3 fL (ref 80.0–100.0)
Monocytes Absolute: 0.6 10*3/uL (ref 0.1–1.0)
Monocytes Relative: 7 %
Neutro Abs: 5.9 10*3/uL (ref 1.7–7.7)
Neutrophils Relative %: 78 %
Platelets: 183 10*3/uL (ref 150–400)
RBC: 4.39 MIL/uL (ref 4.22–5.81)
RDW: 12.3 % (ref 11.5–15.5)
WBC: 7.6 10*3/uL (ref 4.0–10.5)
nRBC: 0 % (ref 0.0–0.2)

## 2019-05-29 ENCOUNTER — Other Ambulatory Visit: Payer: Self-pay

## 2019-05-29 LAB — THYROID PANEL WITH TSH
Free Thyroxine Index: 1 — ABNORMAL LOW (ref 1.2–4.9)
T3 Uptake Ratio: 18 % — ABNORMAL LOW (ref 24–39)
T4, Total: 5.8 ug/dL (ref 4.5–12.0)
TSH: 29.76 u[IU]/mL — ABNORMAL HIGH (ref 0.450–4.500)

## 2019-05-30 ENCOUNTER — Inpatient Hospital Stay: Payer: BC Managed Care – PPO | Admitting: Oncology

## 2019-05-30 ENCOUNTER — Encounter: Payer: Self-pay | Admitting: Oncology

## 2019-05-30 ENCOUNTER — Inpatient Hospital Stay: Payer: BC Managed Care – PPO

## 2019-05-30 ENCOUNTER — Other Ambulatory Visit: Payer: Self-pay

## 2019-05-30 ENCOUNTER — Other Ambulatory Visit: Payer: BC Managed Care – PPO

## 2019-05-30 VITALS — BP 117/80 | HR 94 | Temp 96.3°F | Ht 72.0 in | Wt 214.0 lb

## 2019-05-30 DIAGNOSIS — I110 Hypertensive heart disease with heart failure: Secondary | ICD-10-CM | POA: Diagnosis not present

## 2019-05-30 DIAGNOSIS — C3491 Malignant neoplasm of unspecified part of right bronchus or lung: Secondary | ICD-10-CM

## 2019-05-30 DIAGNOSIS — F419 Anxiety disorder, unspecified: Secondary | ICD-10-CM

## 2019-05-30 DIAGNOSIS — E039 Hypothyroidism, unspecified: Secondary | ICD-10-CM | POA: Diagnosis not present

## 2019-05-30 DIAGNOSIS — Z923 Personal history of irradiation: Secondary | ICD-10-CM

## 2019-05-30 DIAGNOSIS — E871 Hypo-osmolality and hyponatremia: Secondary | ICD-10-CM | POA: Diagnosis not present

## 2019-05-30 DIAGNOSIS — I509 Heart failure, unspecified: Secondary | ICD-10-CM

## 2019-05-30 DIAGNOSIS — J449 Chronic obstructive pulmonary disease, unspecified: Secondary | ICD-10-CM

## 2019-05-30 DIAGNOSIS — Z79899 Other long term (current) drug therapy: Secondary | ICD-10-CM

## 2019-05-30 DIAGNOSIS — Z86010 Personal history of colonic polyps: Secondary | ICD-10-CM

## 2019-05-30 DIAGNOSIS — C342 Malignant neoplasm of middle lobe, bronchus or lung: Secondary | ICD-10-CM | POA: Diagnosis not present

## 2019-05-30 DIAGNOSIS — F17210 Nicotine dependence, cigarettes, uncomplicated: Secondary | ICD-10-CM

## 2019-05-30 DIAGNOSIS — G4733 Obstructive sleep apnea (adult) (pediatric): Secondary | ICD-10-CM

## 2019-05-30 MED ORDER — DEXAMETHASONE SODIUM PHOSPHATE 10 MG/ML IJ SOLN
10.0000 mg | Freq: Once | INTRAMUSCULAR | Status: AC
  Administered 2019-05-30: 10 mg via INTRAVENOUS
  Filled 2019-05-30: qty 1

## 2019-05-30 MED ORDER — SODIUM CHLORIDE 0.9% FLUSH
10.0000 mL | INTRAVENOUS | Status: DC | PRN
  Administered 2019-05-30: 10 mL via INTRAVENOUS
  Filled 2019-05-30: qty 10

## 2019-05-30 MED ORDER — HEPARIN SOD (PORK) LOCK FLUSH 100 UNIT/ML IV SOLN
500.0000 [IU] | Freq: Once | INTRAVENOUS | Status: AC
  Administered 2019-05-30: 500 [IU] via INTRAVENOUS

## 2019-05-30 MED ORDER — DIPHENHYDRAMINE HCL 50 MG/ML IJ SOLN
25.0000 mg | Freq: Once | INTRAMUSCULAR | Status: AC
  Administered 2019-05-30: 25 mg via INTRAVENOUS
  Filled 2019-05-30: qty 1

## 2019-05-30 MED ORDER — SODIUM CHLORIDE 0.9 % IV SOLN
1000.0000 mg | Freq: Once | INTRAVENOUS | Status: AC
  Administered 2019-05-30: 1000 mg via INTRAVENOUS
  Filled 2019-05-30: qty 20

## 2019-05-30 MED ORDER — SODIUM CHLORIDE 0.9 % IV SOLN
Freq: Once | INTRAVENOUS | Status: AC
  Administered 2019-05-30: 12:00:00 via INTRAVENOUS
  Filled 2019-05-30: qty 250

## 2019-05-30 NOTE — Progress Notes (Signed)
Patient stated that he had been feeling very tired and weak. Patient stated that he will see an Endocrinologist on 06/05/2019. Patient wanted to know if he would ever need a dye study since he stated that every time he has his treatment, he is able to see a little bit of blood.

## 2019-06-12 ENCOUNTER — Other Ambulatory Visit: Payer: Self-pay | Admitting: Oncology

## 2019-06-13 ENCOUNTER — Inpatient Hospital Stay: Payer: BC Managed Care – PPO | Attending: Oncology

## 2019-06-13 ENCOUNTER — Other Ambulatory Visit: Payer: Self-pay

## 2019-06-13 ENCOUNTER — Encounter: Payer: Self-pay | Admitting: Radiation Oncology

## 2019-06-13 ENCOUNTER — Ambulatory Visit
Admission: RE | Admit: 2019-06-13 | Discharge: 2019-06-13 | Disposition: A | Discharge status: Home / Self Care | Payer: BC Managed Care – PPO | Source: Ambulatory Visit | Attending: Radiation Oncology | Admitting: Radiation Oncology

## 2019-06-13 VITALS — BP 114/76 | HR 92

## 2019-06-13 VITALS — BP 138/93 | HR 97 | Temp 96.2°F | Resp 16 | Wt 214.4 lb

## 2019-06-13 DIAGNOSIS — I110 Hypertensive heart disease with heart failure: Secondary | ICD-10-CM | POA: Diagnosis not present

## 2019-06-13 DIAGNOSIS — Z9221 Personal history of antineoplastic chemotherapy: Secondary | ICD-10-CM | POA: Insufficient documentation

## 2019-06-13 DIAGNOSIS — C342 Malignant neoplasm of middle lobe, bronchus or lung: Secondary | ICD-10-CM

## 2019-06-13 DIAGNOSIS — C3491 Malignant neoplasm of unspecified part of right bronchus or lung: Secondary | ICD-10-CM

## 2019-06-13 DIAGNOSIS — Z5112 Encounter for antineoplastic immunotherapy: Secondary | ICD-10-CM | POA: Diagnosis present

## 2019-06-13 DIAGNOSIS — Z923 Personal history of irradiation: Secondary | ICD-10-CM | POA: Insufficient documentation

## 2019-06-13 DIAGNOSIS — Z86010 Personal history of colonic polyps: Secondary | ICD-10-CM | POA: Insufficient documentation

## 2019-06-13 DIAGNOSIS — Z79899 Other long term (current) drug therapy: Secondary | ICD-10-CM | POA: Diagnosis not present

## 2019-06-13 DIAGNOSIS — F419 Anxiety disorder, unspecified: Secondary | ICD-10-CM | POA: Diagnosis not present

## 2019-06-13 DIAGNOSIS — J449 Chronic obstructive pulmonary disease, unspecified: Secondary | ICD-10-CM | POA: Diagnosis not present

## 2019-06-13 DIAGNOSIS — R5383 Other fatigue: Secondary | ICD-10-CM | POA: Insufficient documentation

## 2019-06-13 DIAGNOSIS — G4733 Obstructive sleep apnea (adult) (pediatric): Secondary | ICD-10-CM | POA: Insufficient documentation

## 2019-06-13 DIAGNOSIS — F17210 Nicotine dependence, cigarettes, uncomplicated: Secondary | ICD-10-CM | POA: Diagnosis not present

## 2019-06-13 DIAGNOSIS — I509 Heart failure, unspecified: Secondary | ICD-10-CM | POA: Diagnosis not present

## 2019-06-13 DIAGNOSIS — R918 Other nonspecific abnormal finding of lung field: Secondary | ICD-10-CM | POA: Insufficient documentation

## 2019-06-13 MED ORDER — SODIUM CHLORIDE 0.9 % IV SOLN
Freq: Once | INTRAVENOUS | Status: AC
  Administered 2019-06-13: 10:00:00 via INTRAVENOUS
  Filled 2019-06-13: qty 250

## 2019-06-13 MED ORDER — DEXAMETHASONE SODIUM PHOSPHATE 10 MG/ML IJ SOLN
10.0000 mg | Freq: Once | INTRAMUSCULAR | Status: AC
  Administered 2019-06-13: 10 mg via INTRAVENOUS
  Filled 2019-06-13: qty 1

## 2019-06-13 MED ORDER — HEPARIN SOD (PORK) LOCK FLUSH 100 UNIT/ML IV SOLN
500.0000 [IU] | Freq: Once | INTRAVENOUS | Status: AC | PRN
  Administered 2019-06-13: 500 [IU]
  Filled 2019-06-13: qty 5

## 2019-06-13 MED ORDER — SODIUM CHLORIDE 0.9 % IV SOLN
1000.0000 mg | Freq: Once | INTRAVENOUS | Status: AC
  Administered 2019-06-13: 1000 mg via INTRAVENOUS
  Filled 2019-06-13: qty 20

## 2019-06-13 MED ORDER — DIPHENHYDRAMINE HCL 50 MG/ML IJ SOLN
25.0000 mg | Freq: Once | INTRAMUSCULAR | Status: AC
  Administered 2019-06-13: 25 mg via INTRAVENOUS
  Filled 2019-06-13: qty 1

## 2019-06-13 NOTE — Progress Notes (Signed)
Radiation Oncology Follow up Note  Name: Paul Carpenter   Date:   06/13/2019 MRN:  [id number] DOB: Jan 31, 1956    This 63 y.o. male presents to the clinic today for 1 year follow-up status post concurrent chemoradiation therapy for stage IIIa non-small cell lung cancer of the right middle lobe.  REFERRING PROVIDER: Kirk Ruths, MD  HPI: Patient is now 1 year out having completed concurrent chemoradiation therapy for stage IIIa non-small cell lung cancer of the right middle lobe.  He is currently on cycle 24 of maintenance. durvalumab.  He is quite fatigued also states he is is dealing with congestive heart failure with an ejection fraction of 40%.  His last CT scan which I reviewed back in May shows bilateral pulmonary nodules with dominant cavitary lesions in the apical right upper lobe stable from February 2020.  COMPLICATIONS OF TREATMENT: none  FOLLOW UP COMPLIANCE: keeps appointments   PHYSICAL EXAM:  BP (!) 138/93 (BP Location: Left Arm, Patient Position: Sitting)   Pulse 97   Temp (!) 96.2 F (35.7 C) (Tympanic)   Resp 16   Wt 214 lb 6.4 oz (97.3 kg)   BMI 29.08 kg/m  Well-developed well-nourished patient in NAD. HEENT reveals PERLA, EOMI, discs not visualized.  Oral cavity is clear. No oral mucosal lesions are identified. Neck is clear without evidence of cervical or supraclavicular adenopathy. Lungs are clear to A&P. Cardiac examination is essentially unremarkable with regular rate and rhythm without murmur rub or thrill. Abdomen is benign with no organomegaly or masses noted. Motor sensory and DTR levels are equal and symmetric in the upper and lower extremities. Cranial nerves II through XII are grossly intact. Proprioception is intact. No peripheral adenopathy or edema is identified. No motor or sensory levels are noted. Crude visual fields are within normal range.  RADIOLOGY RESULTS: CT scans reviewed compatible with above-stated findings  PLAN: Present time he is  currently under immunotherapy under Dr. [name] direction.  I have asked to see patient back in 1 year for follow-up.  I will be happy to reevaluate the patient anytime should further treatment be indicated.  Patient knows to call with any concerns.  I would like to take this opportunity to thank you for allowing me to participate in the care of your patient.[name], MD

## 2019-06-14 ENCOUNTER — Other Ambulatory Visit: Payer: Self-pay | Admitting: Oncology

## 2019-06-22 ENCOUNTER — Other Ambulatory Visit: Payer: Self-pay

## 2019-06-22 DIAGNOSIS — C3491 Malignant neoplasm of unspecified part of right bronchus or lung: Secondary | ICD-10-CM

## 2019-06-22 NOTE — Progress Notes (Signed)
[place]  [phone] Fax:(336) [phone]  ID: [name] OB: 1956-01-23  MR#: [id number]  JHE#:[id number]  Patient Care Team: Kirk Ruths, MD as PCP - General (Internal Medicine)  CHIEF COMPLAINT: Stage IIIa non-small cell lung cancer, right middle lobe.  INTERVAL HISTORY: Patient returns to clinic today for further evaluation and consideration of cycle 26 and final infusion of maintenance durvalumab.  He continues to have chronic weakness and fatigue that is unchanged.  He also has a chronic cough that is unchanged. He continues to be highly anxious. He has no neurologic complaints.  He denies any recent fevers or illnesses.  He does not complain of dysphasia today.  He denies any chest pain, shortness of breath, or hemoptysis.  He denies any nausea, vomiting, constipation, or diarrhea. He has no urinary complaints.  Patient offers no further specific complaints today.  REVIEW OF SYSTEMS:   Review of Systems  Constitutional: Positive for malaise/fatigue. Negative for fever and weight loss.  HENT: Negative.  Negative for congestion and sore throat.   Respiratory: Positive for cough. Negative for hemoptysis and shortness of breath.   Cardiovascular: Negative.  Negative for chest pain and leg swelling.  Gastrointestinal: Negative.  Negative for abdominal pain, blood in stool and melena.  Genitourinary: Negative.  Negative for dysuria.  Musculoskeletal: Negative.  Negative for joint pain.  Skin: Negative.  Negative for rash.  Neurological: Positive for dizziness and weakness. Negative for sensory change, focal weakness and headaches.  Psychiatric/Behavioral: The patient is nervous/anxious.     As per HPI. Otherwise, a complete review of systems is negative.  PAST MEDICAL HISTORY: Past Medical History:  Diagnosis Date   Anginal pain ([place])    Anxiety    Chest pain    CHF (congestive heart failure) (HCC)    Chicken pox     Complication of anesthesia    o2 dropped after neck fusion   COPD (chronic obstructive pulmonary disease) (HCC)    Coronary artery disease    Cough    chronic  clear phlegm   Dysrhythmia    palpitations   GERD (gastroesophageal reflux disease)    h/o reflux/ hoarsness   Hematochezia    Hemorrhoids    History of chickenpox    History of colon polyps    History of Helicobacter pylori infection    Hoarseness    Hypertension    Lung cancer ([place]) 05/2016   Chemo + rad tx's.    Migraines    OSA (obstructive sleep apnea)    has CPAP but does not use   Personal history of tobacco use, presenting hazards to health 03/05/2016   Pneumonia    5/17   Raynaud disease    Raynaud disease    Raynaud's disease    Rotator cuff tear    on right   Shortness of breath dyspnea    Sleep apnea    Ulcer (traumatic) of oral mucosa     PAST SURGICAL HISTORY: Past Surgical History:  Procedure Laterality Date   BACK SURGERY     cervical fusion x 2   CARDIAC CATHETERIZATION     CERVICAL DISCECTOMY     x 2   COLONOSCOPY     COLONOSCOPY N/A 07/25/2015   Procedure: COLONOSCOPY;  Surgeon: Lollie Sails, MD;  Location: [facility] ENDOSCOPY;  Service: Endoscopy;  Laterality: N/A;   COLONOSCOPY WITH PROPOFOL N/A 10/04/2017   Procedure: COLONOSCOPY WITH PROPOFOL;  Surgeon: Lollie Sails, MD;  Location: [facility] ENDOSCOPY;  Service: Endoscopy;  Laterality: N/A;   ELECTROMAGNETIC NAVIGATION BROCHOSCOPY Left 06/28/2016   Procedure: ELECTROMAGNETIC NAVIGATION BRONCHOSCOPY;  Surgeon: Vilinda Boehringer, MD;  Location: [facility] ORS;  Service: Cardiopulmonary;  Laterality: Left;   ENDOBRONCHIAL ULTRASOUND N/A 04/11/2018   Procedure: ENDOBRONCHIAL ULTRASOUND;  Surgeon: [name], MD;  Location: [facility] ORS;  Service: Cardiopulmonary;  Laterality: N/A;   ESOPHAGOGASTRODUODENOSCOPY N/A 07/25/2015   Procedure: ESOPHAGOGASTRODUODENOSCOPY (EGD);  Surgeon: Lollie Sails, MD;  Location: [facility]  ENDOSCOPY;  Service: Endoscopy;  Laterality: N/A;   NASAL SINUS SURGERY     x 2    PORTA CATH INSERTION N/A 04/24/2018   Procedure: PORTA CATH INSERTION;  Surgeon: Algernon Huxley, MD;  Location: [facility] CV LAB;  Service: Cardiovascular;  Laterality: N/A;   rotator cuff surgery Right    07/2016   SEPTOPLASTY     SKIN GRAFT      FAMILY HISTORY Family History  Problem Relation Age of Onset   Heart disease Father    Prostate cancer Father    Heart disease Paternal Grandmother    Heart attack Maternal Grandfather 9   Kidney cancer Neg Hx    Bladder Cancer Neg Hx    Other Neg Hx        pituitary abnormality       ADVANCED DIRECTIVES:    HEALTH MAINTENANCE: Social History   Tobacco Use   Smoking status: Current Every Day Smoker    Packs/day: 1.50    Years: 45.00    Pack years: 67.50    Types: Cigarettes   Smokeless tobacco: Never Used  Substance Use Topics   Alcohol use: Yes    Alcohol/week: 2.0 standard drinks    Types: 2 Standard drinks or equivalent per week    Comment: moderate   Drug use: No     Allergies  Allergen Reactions   Lisinopril Rash   Varenicline Rash    Current Outpatient Medications  Medication Sig Dispense Refill   acetaminophen (TYLENOL) 500 MG tablet Take 1,000 mg by mouth every 6 (six) hours as needed (for pain.).     albuterol (PROAIR HFA) 108 (90 Base) MCG/ACT inhaler Inhale 2 puffs into the lungs every 4 (four) hours as needed for wheezing or shortness of breath. 1 Inhaler 1   atorvastatin (LIPITOR) 40 MG tablet Take 40 mg by mouth at bedtime.      busPIRone (BUSPAR) 15 MG tablet Take 15 mg by mouth 2 (two) times daily.   10   diazepam (VALIUM) 5 MG tablet TAKE 2 TABLETS (10 MG TOTAL) BY MOUTH EVERY 12 (TWELVE) HOURS AS NEEDED FOR ANXIETY. 60 tablet 1   DULoxetine (CYMBALTA) 60 MG capsule Take 1 capsule by mouth 1 day or 1 dose.     fluticasone (FLONASE) 50 MCG/ACT nasal spray Place 1 spray into both nostrils  daily as needed (for allergies.).      ipratropium-albuterol (DUONEB) 0.5-2.5 (3) MG/3ML SOLN Take 3 mLs by nebulization every 6 (six) hours. 360 mL 5   levothyroxine (SYNTHROID) 25 MCG tablet TAKE 1 TABLET (25 MCG TOTAL) BY MOUTH DAILY BEFORE BREAKFAST. (Patient taking differently: Take 75 mcg by mouth daily before breakfast. ) 30 tablet 1   lidocaine (XYLOCAINE) 2 % solution   2   lidocaine-prilocaine (EMLA) cream Apply 1 application topically as needed.     metoprolol succinate (TOPROL-XL) 25 MG 24 hr tablet Take 25 mg by mouth daily.     Multiple Vitamin (MULTIVITAMIN WITH MINERALS) TABS tablet Take 1 tablet by mouth  daily.      MYRBETRIQ 50 MG TB24 tablet TAKE 1 TABLET BY MOUTH EVERY DAY 90 tablet 3   ondansetron (ZOFRAN) 8 MG tablet Take 8 mg by mouth every 8 (eight) hours as needed for nausea or vomiting.     OXYGEN Inhale 2 L into the lungs at bedtime.      pantoprazole (PROTONIX) 40 MG tablet Take 40 mg by mouth daily.  3   potassium chloride (K-DUR) 10 MEQ tablet TAKE 1 TABLET (10 MEQ TOTAL) BY MOUTH ONCE DAILY.  3   umeclidinium-vilanterol (ANORO ELLIPTA) 62.5-25 MCG/INH AEPB Inhale 1 puff into the lungs daily. 1 each 10   nitroGLYCERIN (NITROSTAT) 0.4 MG SL tablet Place 0.4 mg under the tongue every 5 (five) minutes as needed for chest pain.      No current facility-administered medications for this visit.    Facility-Administered Medications Ordered in Other Visits  Medication Dose Route Frequency Provider Last Rate Last Dose   heparin lock flush 100 unit/mL  500 Units Intravenous Once [name], MD        OBJECTIVE: Vitals:   06/27/19 1054  BP: 133/87  Pulse: 97  Resp: 20  Temp: 98.7 F (37.1 C)  SpO2: 100%     Body mass index is 29.44 kg/m.    ECOG FS:0 - Asymptomatic  General: Well-developed, well-nourished, no acute distress. Eyes: Pink conjunctiva, anicteric sclera. HEENT: Normocephalic, moist mucous membranes. Lungs: Clear to  auscultation bilaterally. Heart: Regular rate and rhythm. No rubs, murmurs, or gallops. Abdomen: Soft, nontender, nondistended. No organomegaly noted, normoactive bowel sounds. Musculoskeletal: No edema, cyanosis, or clubbing. Neuro: Alert, answering all questions appropriately. Cranial nerves grossly intact. Skin: No rashes or petechiae noted. Psych: Normal affect.  LAB RESULTS:  Lab Results  Component Value Date   NA 132 (L) 06/25/2019   K 4.3 06/25/2019   CL 97 (L) 06/25/2019   CO2 26 06/25/2019   GLUCOSE 74 06/25/2019   BUN 8 06/25/2019   CREATININE 0.92 06/25/2019   CALCIUM 9.1 06/25/2019   PROT 7.4 06/25/2019   ALBUMIN 4.2 06/25/2019   AST 35 06/25/2019   ALT 28 06/25/2019   ALKPHOS 93 06/25/2019   BILITOT 0.5 06/25/2019   GFRNONAA >60 06/25/2019   GFRAA >60 06/25/2019    Lab Results  Component Value Date   WBC 10.2 06/25/2019   NEUTROABS 8.2 (H) 06/25/2019   HGB 14.8 06/25/2019   HCT 42.6 06/25/2019   MCV 95.5 06/25/2019   PLT 204 06/25/2019     STUDIES:  ASSESSMENT: Stage IIIa non-small cell lung cancer, right middle lobe.  PLAN:    1. Stage IIIa non-small cell lung cancer, right middle lobe: Case discussed with pathologist and unable to determine whether this is adenocarcinoma or squamous cell carcinoma.  There is also insufficient tissue to do further testing.  Liquid biopsy did not reveal any actionable mutations.  CT scan results from Mar 23, 2019 reviewed independently with stable disease and no evidence of recurrence or progression. MRI of the brain completed on Mar 28, 2018 reviewed independently did not reveal metastatic disease.  Patient completed XRT June 26, 2018.  He completed his concurrent single agent carboplatinum on June 21, 2018.  Patient had a reaction to Taxol during cycle 1 and this was discontinued.  Patient initiated maintenance durvalumab on July 12, 2018.  Proceed with cycle 26 and final infusion of maintenance durvalumab today.   Return to clinic in 4 weeks with repeat imaging and further evaluation.  2.  Secondary polycythemia: Resolved.  Likely secondary to heavy tobacco use.  Previously, the remainder of his laboratory work, including JAK-2 mutation, was either negative or within normal limits.  3.  Right upper lobe pulmonary nodule: CT scan results as above.  Proceed with treatment as planned.  4.  Colon polyps:  Patient has a personal history of greater then 10 adenomatous polyps on his most recent conoloscopy. He does not know of any family history of increased polyps or colon cancer.  Genetic testing to assess for the APC mutation for FAP or AFAP was negative. Continue colonoscopies as per GI. 5. Tobacco Use: Patient continues to heavily smoke.  He expressed understanding by continuing tobacco use increases his chance of recurrence. 6. Anxiety: Chronic. Continue Valium 10 mg every 12 hours as needed.  Continue treatment and evaluation per primary care. 7.  Hyponatremia: Chronic and unchanged.  Sodium is 132 today.  Monitor. 8.  Hypothyroidism: Appreciate endocrinology input.  Patient Synthroid dose has been increased to 75 mcg daily. 9.  CHF: Patient has an EF of 40%.  Continue evaluation and treatment per cardiology. 10.  Weakness and fatigue: Multifactorial.  Appreciate Occupational Therapy input.  Patient has requested in-home physical therapy.  Endocrinology as above. 11.  Dizziness: Possibly secondary to dehydration.  Patient was offered an additional liter of fluids today which he declined.   Patient expressed understanding and was in agreement with this plan. He also understands that He can call clinic at any time with any questions, concerns, or complaints.    [name], MD   06/28/2019 6:59 AM

## 2019-06-25 ENCOUNTER — Other Ambulatory Visit: Payer: Self-pay

## 2019-06-25 ENCOUNTER — Inpatient Hospital Stay: Payer: BC Managed Care – PPO | Admitting: *Deleted

## 2019-06-25 DIAGNOSIS — C3491 Malignant neoplasm of unspecified part of right bronchus or lung: Secondary | ICD-10-CM

## 2019-06-25 DIAGNOSIS — Z95828 Presence of other vascular implants and grafts: Secondary | ICD-10-CM

## 2019-06-25 DIAGNOSIS — C342 Malignant neoplasm of middle lobe, bronchus or lung: Secondary | ICD-10-CM | POA: Diagnosis not present

## 2019-06-25 LAB — COMPREHENSIVE METABOLIC PANEL
ALT: 28 U/L (ref 0–44)
AST: 35 U/L (ref 15–41)
Albumin: 4.2 g/dL (ref 3.5–5.0)
Alkaline Phosphatase: 93 U/L (ref 38–126)
Anion gap: 9 (ref 5–15)
BUN: 8 mg/dL (ref 8–23)
CO2: 26 mmol/L (ref 22–32)
Calcium: 9.1 mg/dL (ref 8.9–10.3)
Chloride: 97 mmol/L — ABNORMAL LOW (ref 98–111)
Creatinine, Ser: 0.92 mg/dL (ref 0.61–1.24)
GFR calc Af Amer: >60 mL/min (ref >60)
GFR calc non Af Amer: >60 mL/min (ref >60)
Glucose, Bld: 74 mg/dL (ref 70–99)
Potassium: 4.3 mmol/L (ref 3.5–5.1)
Sodium: 132 mmol/L — ABNORMAL LOW (ref 135–145)
Total Bilirubin: 0.5 mg/dL (ref 0.3–1.2)
Total Protein: 7.4 g/dL (ref 6.5–8.1)

## 2019-06-25 LAB — CBC WITH DIFFERENTIAL/PLATELET
Abs Immature Granulocytes: 0.04 10*3/uL (ref 0.00–0.07)
Basophils Absolute: 0.1 10*3/uL (ref 0.0–0.1)
Basophils Relative: 1 %
Eosinophils Absolute: 0.2 10*3/uL (ref 0.0–0.5)
Eosinophils Relative: 2 %
HCT: 42.6 % (ref 39.0–52.0)
Hemoglobin: 14.8 g/dL (ref 13.0–17.0)
Immature Granulocytes: 0 %
Lymphocytes Relative: 9 %
Lymphs Abs: 0.9 10*3/uL (ref 0.7–4.0)
MCH: 33.2 pg (ref 26.0–34.0)
MCHC: 34.7 g/dL (ref 30.0–36.0)
MCV: 95.5 fL (ref 80.0–100.0)
Monocytes Absolute: 0.7 10*3/uL (ref 0.1–1.0)
Monocytes Relative: 7 %
Neutro Abs: 8.2 10*3/uL — ABNORMAL HIGH (ref 1.7–7.7)
Neutrophils Relative %: 81 %
Platelets: 204 10*3/uL (ref 150–400)
RBC: 4.46 MIL/uL (ref 4.22–5.81)
RDW: 12.5 % (ref 11.5–15.5)
WBC: 10.2 10*3/uL (ref 4.0–10.5)
nRBC: 0 % (ref 0.0–0.2)

## 2019-06-25 MED ORDER — SODIUM CHLORIDE 0.9% FLUSH
10.0000 mL | Freq: Once | INTRAVENOUS | Status: DC
  Filled 2019-06-25: qty 10

## 2019-06-26 ENCOUNTER — Other Ambulatory Visit: Payer: Self-pay

## 2019-06-26 LAB — THYROID PANEL WITH TSH
Free Thyroxine Index: 1.3 (ref 1.2–4.9)
T3 Uptake Ratio: 20 % — ABNORMAL LOW (ref 24–39)
T4, Total: 6.5 ug/dL (ref 4.5–12.0)
TSH: 31.3 u[IU]/mL — ABNORMAL HIGH (ref 0.450–4.500)

## 2019-06-27 ENCOUNTER — Other Ambulatory Visit: Payer: BC Managed Care – PPO

## 2019-06-27 ENCOUNTER — Encounter: Payer: Self-pay | Admitting: Oncology

## 2019-06-27 ENCOUNTER — Other Ambulatory Visit: Payer: Self-pay

## 2019-06-27 ENCOUNTER — Inpatient Hospital Stay: Payer: BC Managed Care – PPO

## 2019-06-27 ENCOUNTER — Inpatient Hospital Stay: Payer: BC Managed Care – PPO | Admitting: Oncology

## 2019-06-27 VITALS — BP 133/87 | HR 97 | Temp 98.7°F | Resp 20 | Wt 217.1 lb

## 2019-06-27 DIAGNOSIS — C3491 Malignant neoplasm of unspecified part of right bronchus or lung: Secondary | ICD-10-CM

## 2019-06-27 DIAGNOSIS — C342 Malignant neoplasm of middle lobe, bronchus or lung: Secondary | ICD-10-CM | POA: Diagnosis not present

## 2019-06-27 MED ORDER — HEPARIN SOD (PORK) LOCK FLUSH 100 UNIT/ML IV SOLN
500.0000 [IU] | Freq: Once | INTRAVENOUS | Status: AC | PRN
  Administered 2019-06-27: 500 [IU]
  Filled 2019-06-27: qty 5

## 2019-06-27 MED ORDER — DIPHENHYDRAMINE HCL 50 MG/ML IJ SOLN
25.0000 mg | Freq: Once | INTRAMUSCULAR | Status: AC
  Administered 2019-06-27: 25 mg via INTRAVENOUS
  Filled 2019-06-27: qty 1

## 2019-06-27 MED ORDER — SODIUM CHLORIDE 0.9 % IV SOLN
1000.0000 mg | Freq: Once | INTRAVENOUS | Status: AC
  Administered 2019-06-27: 1000 mg via INTRAVENOUS
  Filled 2019-06-27: qty 20

## 2019-06-27 MED ORDER — SODIUM CHLORIDE 0.9 % IV SOLN
Freq: Once | INTRAVENOUS | Status: AC
  Administered 2019-06-27: 12:00:00 via INTRAVENOUS
  Filled 2019-06-27: qty 250

## 2019-06-27 MED ORDER — DEXAMETHASONE SODIUM PHOSPHATE 10 MG/ML IJ SOLN
10.0000 mg | Freq: Once | INTRAMUSCULAR | Status: AC
  Administered 2019-06-27: 10 mg via INTRAVENOUS
  Filled 2019-06-27: qty 1

## 2019-06-27 NOTE — Progress Notes (Signed)
Patient is here today for follow up, he is doing ok, mentions dizziness and feeling very fatigued. Checked orthostatics sitting bp 133/87 pulse 97, standing 116/88 heart rate 108.

## 2019-07-13 ENCOUNTER — Other Ambulatory Visit: Payer: Self-pay | Admitting: Family Medicine

## 2019-07-13 DIAGNOSIS — N401 Enlarged prostate with lower urinary tract symptoms: Secondary | ICD-10-CM

## 2019-07-13 DIAGNOSIS — N138 Other obstructive and reflux uropathy: Secondary | ICD-10-CM

## 2019-07-16 ENCOUNTER — Other Ambulatory Visit: Payer: Self-pay

## 2019-07-16 ENCOUNTER — Other Ambulatory Visit: Payer: BC Managed Care – PPO

## 2019-07-16 DIAGNOSIS — N138 Other obstructive and reflux uropathy: Secondary | ICD-10-CM

## 2019-07-16 DIAGNOSIS — N401 Enlarged prostate with lower urinary tract symptoms: Secondary | ICD-10-CM

## 2019-07-17 LAB — PSA: Prostate Specific Ag, Serum: 1 ng/mL (ref 0.0–4.0)

## 2019-07-17 NOTE — Progress Notes (Signed)
07/18/2019  3:13 PM   Paul Carpenter 03-Mar-1956 [id number]  Referring provider: Kirk Ruths, MD [facility] [place],  [place] [address]  Chief Complaint  Patient presents with  . Benign Prostatic Hypertrophy    HPI: Paul Carpenter is a 63 y.o.  male with Stage III lung cancer, erectile dysfunction and BPH with LUTS who presents today for yearly follow up.   Erectile dysfunction His SHIM score is 14, which is mild to moderate ED.   He has been having difficulty with erections for several years.  His libido is diminshed.   His risk factors for ED are age, BPH, testosterone deficiency, HTN and CAD.  He denies any painful erections or curvatures with his erections.   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  He is still having spontaneous erections.    SHIM    Row Name 07/18/19 1511         SHIM: Over the last 6 months:   How do you rate your confidence that you could get and keep an erection?  Moderate     When you had erections with sexual stimulation, how often were your erections hard enough for penetration (entering your partner)?  Sometimes (about half the time)     During sexual intercourse, how often were you able to maintain your erection after you had penetrated (entered) your partner?  A Few Times (much less than half the time)     During sexual intercourse, how difficult was it to maintain your erection to completion of intercourse?  Slightly Difficult     When you attempted sexual intercourse, how often was it satisfactory for you?  A Few Times (much less than half the time)       SHIM Total Score   SHIM  14        Score: 1-7 Severe ED 8-11 Moderate ED 12-16 Mild-Moderate ED 17-21 Mild ED 22-25 No ED    BPH WITH LUTS  (prostate and/or bladder) IPSS score: 16/2     Previous score: 17/3  Previous PVR: 65 mL  Major complaint(s):  Urgency over the last year. Denies any dysuria, hematuria or suprapubic pain.   Denies  any recent fevers, chills, nausea or vomiting.   IPSS    Row Name 07/18/19 1500         International Prostate Symptom Score   How often have you had the sensation of not emptying your bladder?  Less than half the time     How often have you had to urinate less than every two hours?  About half the time     How often have you found you stopped and started again several times when you urinated?  About half the time     How often have you found it difficult to postpone urination?  More than half the time     How often have you had a weak urinary stream?  Less than 1 in 5 times     How often have you had to strain to start urination?  Less than 1 in 5 times     How many times did you typically get up at night to urinate?  2 Times     Total IPSS Score  16       Quality of Life due to urinary symptoms   If you were to spend the rest of your life with your urinary condition just the way it is now  how would you feel about that?  Mostly Satisfied        Score:  1-7 Mild 8-19 Moderate 20-35 Severe  PMH: Past Medical History:  Diagnosis Date  . Anginal pain ([place])   . Anxiety   . Chest pain   . CHF (congestive heart failure) ([place])   . Chicken pox   . Complication of anesthesia    o2 dropped after neck fusion  . COPD (chronic obstructive pulmonary disease) ([place])   . Coronary artery disease   . Cough    chronic  clear phlegm  . Dysrhythmia    palpitations  . GERD (gastroesophageal reflux disease)    h/o reflux/ hoarsness  . Hematochezia   . Hemorrhoids   . History of chickenpox   . History of colon polyps   . History of Helicobacter pylori infection   . Hoarseness   . Hypertension   . Lung cancer ([place]) 05/2016   Chemo + rad tx's.   . Migraines   . OSA (obstructive sleep apnea)    has CPAP but does not use  . Personal history of tobacco use, presenting hazards to health 03/05/2016  . Pneumonia    5/17  . Raynaud disease   . Raynaud disease   . Raynaud's disease   .  Rotator cuff tear    on right  . Shortness of breath dyspnea   . Sleep apnea   . Ulcer (traumatic) of oral mucosa     Surgical History: Past Surgical History:  Procedure Laterality Date  . BACK SURGERY     cervical fusion x 2  . CARDIAC CATHETERIZATION    . CERVICAL DISCECTOMY     x 2  . COLONOSCOPY    . COLONOSCOPY N/A 07/25/2015   Procedure: COLONOSCOPY;  Surgeon: Lollie Sails, MD;  Location: [facility] ENDOSCOPY;  Service: Endoscopy;  Laterality: N/A;  . COLONOSCOPY WITH PROPOFOL N/A 10/04/2017   Procedure: COLONOSCOPY WITH PROPOFOL;  Surgeon: Lollie Sails, MD;  Location[facility] ENDOSCOPY;  Service: Endoscopy;  Laterality: N/A;  . ELECTROMAGNETIC NAVIGATION BROCHOSCOPY Left 06/28/2016   Procedure: ELECTROMAGNETIC NAVIGATION BRONCHOSCOPY;  Surgeon: Vilinda Boehringer, MD;  Location: [facility] ORS;  Service: Cardiopulmonary;  Laterality: Left;  . ENDOBRONCHIAL ULTRASOUND N/A 04/11/2018   Procedure: ENDOBRONCHIAL ULTRASOUND;  Surgeon: [name], MD;  Location: [facility] ORS;  Service: Cardiopulmonary;  Laterality: N/A;  . ESOPHAGOGASTRODUODENOSCOPY N/A 07/25/2015   Procedure: ESOPHAGOGASTRODUODENOSCOPY (EGD);  Surgeon: Lollie Sails, MD;  Location[facility] ENDOSCOPY;  Service: Endoscopy;  Laterality: N/A;  . NASAL SINUS SURGERY     x 2   . PORTA CATH INSERTION N/A 04/24/2018   Procedure: PORTA CATH INSERTION;  Surgeon: Algernon Huxley, MD;  Location: [place] CV LAB;  Service: Cardiovascular;  Laterality: N/A;  . rotator cuff surgery Right    07/2016  . SEPTOPLASTY    . SKIN GRAFT      Home Medications:  Allergies as of 07/18/2019      Reactions   Lisinopril Rash   Varenicline Rash      Medication List       Accurate as of July 18, 2019 11:59 PM. If you have any questions, ask your nurse or doctor.        STOP taking these medications   Myrbetriq 50 MG Tb24 tablet Generic drug: mirabegron ER Stopped by: Zara Council, PA-C     TAKE these medications   acetaminophen  500 MG tablet Commonly known as: TYLENOL Take 1,000 mg by mouth every  6 (six) hours as needed (for pain.).   albuterol 108 (90 Base) MCG/ACT inhaler Commonly known as: ProAir HFA Inhale 2 puffs into the lungs every 4 (four) hours as needed for wheezing or shortness of breath.   atorvastatin 40 MG tablet Commonly known as: LIPITOR Take 40 mg by mouth at bedtime.   busPIRone 15 MG tablet Commonly known as: BUSPAR Take 15 mg by mouth 2 (two) times daily.   diazepam 5 MG tablet Commonly known as: VALIUM TAKE 2 TABLETS (10 MG TOTAL) BY MOUTH EVERY 12 (TWELVE) HOURS AS NEEDED FOR ANXIETY.   DULoxetine 60 MG capsule Commonly known as: CYMBALTA Take 1 capsule by mouth 1 day or 1 dose.   fesoterodine 4 MG Tb24 tablet Commonly known as: TOVIAZ Take 1 tablet (4 mg total) by mouth daily. Started by: Zara Council, PA-C   fluticasone 50 MCG/ACT nasal spray Commonly known as: FLONASE Place 1 spray into both nostrils daily as needed (for allergies.).   ipratropium-albuterol 0.5-2.5 (3) MG/3ML Soln Commonly known as: DUONEB Take 3 mLs by nebulization every 6 (six) hours.   levothyroxine 25 MCG tablet Commonly known as: SYNTHROID TAKE 1 TABLET (25 MCG TOTAL) BY MOUTH DAILY BEFORE BREAKFAST. What changed: how much to take   lidocaine 2 % solution Commonly known as: XYLOCAINE   lidocaine-prilocaine cream Commonly known as: EMLA Apply 1 application topically as needed.   metoprolol succinate 25 MG 24 hr tablet Commonly known as: TOPROL-XL Take 25 mg by mouth daily.   multivitamin with minerals Tabs tablet Take 1 tablet by mouth daily.   nitroGLYCERIN 0.4 MG SL tablet Commonly known as: NITROSTAT Place 0.4 mg under the tongue every 5 (five) minutes as needed for chest pain.   ondansetron 8 MG tablet Commonly known as: ZOFRAN Take 8 mg by mouth every 8 (eight) hours as needed for nausea or vomiting.   OXYGEN Inhale 2 L into the lungs at bedtime.   pantoprazole 40 MG  tablet Commonly known as: PROTONIX Take 40 mg by mouth daily.   potassium chloride 10 MEQ tablet Commonly known as: K-DUR TAKE 1 TABLET (10 MEQ TOTAL) BY MOUTH ONCE DAILY.   umeclidinium-vilanterol 62.5-25 MCG/INH Aepb Commonly known as: Anoro Ellipta Inhale 1 puff into the lungs daily.       Allergies:  Allergies  Allergen Reactions  . Lisinopril Rash  . Varenicline Rash    Family History: Family History  Problem Relation Age of Onset  . Heart disease Father   . Prostate cancer Father   . Heart disease Paternal Grandmother   . Heart attack Maternal Grandfather 52  . Kidney cancer Neg Hx   . Bladder Cancer Neg Hx   . Other Neg Hx        pituitary abnormality    Social History:  reports that he has been smoking cigarettes. He has a 67.50 pack-year smoking history. He has never used smokeless tobacco. He reports current alcohol use of about 2.0 standard drinks of alcohol per week. He reports that he does not use drugs.  ROS: UROLOGY Frequent Urination?: No Hard to postpone urination?: Yes Burning/pain with urination?: No Get up at night to urinate?: No Leakage of urine?: No Urine stream starts and stops?: No Trouble starting stream?: No Do you have to strain to urinate?: No Blood in urine?: No Urinary tract infection?: No Sexually transmitted disease?: No Injury to kidneys or bladder?: No Painful intercourse?: No Weak stream?: No Erection problems?: No Penile pain?: No  Gastrointestinal Nausea?:  No Vomiting?: No Indigestion/heartburn?: No Diarrhea?: No Constipation?: No  Constitutional Fever: No Night sweats?: No Weight loss?: No Fatigue?: No  Skin Skin rash/lesions?: No Itching?: No  Eyes Blurred vision?: No Double vision?: No  Ears/Nose/Throat Sore throat?: No Sinus problems?: No  Hematologic/Lymphatic Swollen glands?: No Easy bruising?: Yes  Cardiovascular Leg swelling?: No Chest pain?: No  Respiratory Cough?: Yes Shortness  of breath?: Yes  Endocrine Excessive thirst?: No  Musculoskeletal Back pain?: No Joint pain?: No  Neurological Headaches?: No Dizziness?: Yes  Psychologic Depression?: No Anxiety?: No  Physical Exam: BP 134/82 (BP Location: Left Arm, Patient Position: Sitting, Cuff Size: Normal)   Pulse 90   Ht 6' (1.829 m)   Wt 216 lb 4.8 oz (98.1 kg)   BMI 29.34 kg/m   Constitutional:  Well nourished. Alert and oriented, No acute distress. HEENT: [place] AT, moist mucus membranes.  Trachea midline, no masses. Cardiovascular: No clubbing, cyanosis, or edema. Respiratory: Normal respiratory effort, no increased work of breathing. GI: Abdomen is soft, non tender, non distended, no abdominal masses. Liver and spleen not palpable.  No hernias appreciated.  Stool sample for occult testing is not indicated.   GU: No CVA tenderness.  No bladder fullness or masses.  Patient with circumcised phallus.   Urethral meatus is patent.  No penile discharge. No penile lesions or rashes. Scrotum without lesions, cysts, rashes and/or edema.  Testicles are located scrotally bilaterally. No masses are appreciated in the testicles. Left and right epididymis are normal. Rectal: Patient with  normal sphincter tone. Anus and perineum without scarring or rashes. No rectal masses are appreciated. Prostate is approximately 45 grams, could only palpate the apex and the midportion of the gland, no nodules are appreciated. Seminal vesicles are normal Skin: No rashes, bruises or suspicious lesions. Lymph: No  inguinal adenopathy. Neurologic: Grossly intact, no focal deficits, moving all 4 extremities. Psychiatric: Normal mood and affect.  Laboratory Data: Lab Results  Component Value Date   WBC 7.9 07/23/2019   HGB 14.7 07/23/2019   HCT 41.8 07/23/2019   MCV 95.0 07/23/2019   PLT 221 07/23/2019   Lab Results  Component Value Date   CREATININE 0.93 07/23/2019   Lab Results  Component Value Date   TESTOSTERONE 359  12/21/2018   Assessment & Plan:    1. BPH with LUTS IPSS score is 16/2, it is improving Continue conservative management, avoiding bladder irritants and timed voiding's Most bothersome symptoms is/are urgency  Initiate Toviaz 4 mg daily, #28 samples -  advised of the side effects such as: Dry eyes, dry mouth, constipation, mental confusion and/or urinary retention - will contact us if he desires a prescription  RTC in 12 months for IPSS, PSA, PVR and exam   2. Erectile dysfunction SHIM score is 14 Not interested in treatment at this time RTC in 12 months for SHIM and exam  Return in about 1 year (around 07/17/2020) for IPSS, SHIM, PSA and exam.  Zara Council, [facility] [place] [address], [place], [place] [address] [phone]

## 2019-07-18 ENCOUNTER — Ambulatory Visit: Payer: BC Managed Care – PPO | Admitting: Urology

## 2019-07-18 ENCOUNTER — Encounter: Payer: Self-pay | Admitting: Urology

## 2019-07-18 ENCOUNTER — Other Ambulatory Visit: Payer: Self-pay

## 2019-07-18 VITALS — BP 134/82 | HR 90 | Ht 72.0 in | Wt 216.3 lb

## 2019-07-18 DIAGNOSIS — N138 Other obstructive and reflux uropathy: Secondary | ICD-10-CM | POA: Diagnosis not present

## 2019-07-18 DIAGNOSIS — N529 Male erectile dysfunction, unspecified: Secondary | ICD-10-CM

## 2019-07-18 DIAGNOSIS — N486 Induration penis plastica: Secondary | ICD-10-CM

## 2019-07-18 DIAGNOSIS — N401 Enlarged prostate with lower urinary tract symptoms: Secondary | ICD-10-CM

## 2019-07-18 MED ORDER — FESOTERODINE FUMARATE ER 4 MG PO TB24: 4.0000 mg | ORAL_TABLET | Freq: Every day | ORAL | 0 refills | Status: DC

## 2019-07-19 NOTE — Progress Notes (Signed)
[place]  Telephone:([phone]) [phone] Fax:(336) [phone]  ID: Paul Carpenter OB: 1956/10/10  MR#: [id number]  LZJ#:[id number]  Patient Care Team: Kirk Ruths, MD as PCP - General (Internal Medicine)  I connected with Paul Carpenter on 07/25/19 at  2:30 PM EDT by video enabled telemedicine visit and verified that I am speaking with the correct person using two identifiers.   I discussed the limitations, risks, security and privacy concerns of performing an evaluation and management service by telemedicine and the availability of in-person appointments. I also discussed with the patient that there may be a patient responsible charge related to this service. The patient expressed understanding and agreed to proceed.   Other persons participating in the visit and their role in the encounter: Patient, MD  Patient's location: Home Provider's location: Clinic  CHIEF COMPLAINT: Stage IIIa non-small cell lung cancer, right middle lobe.  INTERVAL HISTORY: Patient agreed to video enabled telemedicine visit for further evaluation and discussion of his imaging results.  He is actively smoking on camera.  He continues to have chronic weakness and fatigue.  He continues to be highly anxious. He has no neurologic complaints.  He denies any recent fevers or illnesses.  He does not complain of dysphasia today.  He denies any chest pain, shortness of breath, or hemoptysis.  He has a chronic cough.  He denies any nausea, vomiting, constipation, or diarrhea. He has no urinary complaints.  Patient offers no further specific complaints today.    REVIEW OF SYSTEMS:   Review of Systems  Constitutional: Positive for malaise/fatigue. Negative for fever and weight loss.  HENT: Negative.  Negative for congestion and sore throat.   Respiratory: Positive for cough. Negative for hemoptysis and shortness of breath.   Cardiovascular: Negative.  Negative for chest pain and leg swelling.   Gastrointestinal: Negative.  Negative for abdominal pain, blood in stool and melena.  Genitourinary: Negative.  Negative for dysuria.  Musculoskeletal: Negative.  Negative for joint pain.  Skin: Negative.  Negative for rash.  Neurological: Positive for weakness. Negative for dizziness, sensory change, focal weakness and headaches.  Psychiatric/Behavioral: The patient is nervous/anxious.     As per HPI. Otherwise, a complete review of systems is negative.  PAST MEDICAL HISTORY: Past Medical History:  Diagnosis Date  . Anginal pain ([place])   . Anxiety   . Chest pain   . CHF (congestive heart failure) ([place])   . Chicken pox   . Complication of anesthesia    o2 dropped after neck fusion  . COPD (chronic obstructive pulmonary disease) ([place])   . Coronary artery disease   . Cough    chronic  clear phlegm  . Dysrhythmia    palpitations  . GERD (gastroesophageal reflux disease)    h/o reflux/ hoarsness  . Hematochezia   . Hemorrhoids   . History of chickenpox   . History of colon polyps   . History of Helicobacter pylori infection   . Hoarseness   . Hypertension   . Lung cancer ([place]) 05/2016   Chemo + rad tx's.   . Migraines   . OSA (obstructive sleep apnea)    has CPAP but does not use  . Personal history of tobacco use, presenting hazards to health 03/05/2016  . Pneumonia    5/17  . Raynaud disease   . Raynaud disease   . Raynaud's disease   . Rotator cuff tear    on right  . Shortness of breath dyspnea   .  Sleep apnea   . Ulcer (traumatic) of oral mucosa     PAST SURGICAL HISTORY: Past Surgical History:  Procedure Laterality Date  . BACK SURGERY     cervical fusion x 2  . CARDIAC CATHETERIZATION    . CERVICAL DISCECTOMY     x 2  . COLONOSCOPY    . COLONOSCOPY N/A 07/25/2015   Procedure: COLONOSCOPY;  Surgeon: Lollie Sails, MD;  Location: [facility] ENDOSCOPY;  Service: Endoscopy;  Laterality: N/A;  . COLONOSCOPY WITH PROPOFOL N/A 10/04/2017   Procedure: COLONOSCOPY  WITH PROPOFOL;  Surgeon: Lollie Sails, MD;  Location: [facility] ENDOSCOPY;  Service: Endoscopy;  Laterality: N/A;  . ELECTROMAGNETIC NAVIGATION BROCHOSCOPY Left 06/28/2016   Procedure: ELECTROMAGNETIC NAVIGATION BRONCHOSCOPY;  Surgeon: Vilinda Boehringer, MD;  Location: [facility] ORS;  Service: Cardiopulmonary;  Laterality: Left;  . ENDOBRONCHIAL ULTRASOUND N/A 04/11/2018   Procedure: ENDOBRONCHIAL ULTRASOUND;  Surgeon: [name], MD;  Location: [facility] ORS;  Service: Cardiopulmonary;  Laterality: N/A;  . ESOPHAGOGASTRODUODENOSCOPY N/A 07/25/2015   Procedure: ESOPHAGOGASTRODUODENOSCOPY (EGD);  Surgeon: Lollie Sails, MD;  Location: [facility] ENDOSCOPY;  Service: Endoscopy;  Laterality: N/A;  . NASAL SINUS SURGERY     x 2   . PORTA CATH INSERTION N/A 04/24/2018   Procedure: PORTA CATH INSERTION;  Surgeon: Algernon Huxley, MD;  Location: [place] [facility] LAB;  Service: Cardiovascular;  Laterality: N/A;  . rotator cuff surgery Right    07/2016  . SEPTOPLASTY    . SKIN GRAFT      FAMILY HISTORY Family History  Problem Relation Age of Onset  . Heart disease Father   . Prostate cancer Father   . Heart disease Paternal Grandmother   . Heart attack Maternal Grandfather 52  . Kidney cancer Neg Hx   . Bladder Cancer Neg Hx   . Other Neg Hx        pituitary abnormality       ADVANCED DIRECTIVES:    HEALTH MAINTENANCE: Social History   Tobacco Use  . Smoking status: Current Every Day Smoker    Packs/day: 1.50    Years: 45.00    Pack years: 67.50    Types: Cigarettes  . Smokeless tobacco: Never Used  Substance Use Topics  . Alcohol use: Yes    Alcohol/week: 2.0 standard drinks    Types: 2 Standard drinks or equivalent per week    Comment: moderate  . Drug use: No     Allergies  Allergen Reactions  . Lisinopril Rash  . Varenicline Rash    Current Outpatient Medications  Medication Sig Dispense Refill  . acetaminophen (TYLENOL) 500 MG tablet Take 1,000 mg by mouth every 6 (six) hours as  needed (for pain.).    Marland Kitchen albuterol (PROAIR HFA) 108 (90 Base) MCG/ACT inhaler Inhale 2 puffs into the lungs every 4 (four) hours as needed for wheezing or shortness of breath. 1 Inhaler 1  . atorvastatin (LIPITOR) 40 MG tablet Take 40 mg by mouth at bedtime.     . busPIRone (BUSPAR) 15 MG tablet Take 15 mg by mouth 2 (two) times daily.   10  . diazepam (VALIUM) 5 MG tablet TAKE 2 TABLETS (10 MG TOTAL) BY MOUTH EVERY 12 (TWELVE) HOURS AS NEEDED FOR ANXIETY. 60 tablet 1  . DULoxetine (CYMBALTA) 60 MG capsule Take 1 capsule by mouth 1 day or 1 dose.    . fesoterodine (TOVIAZ) 4 MG TB24 tablet Take 1 tablet (4 mg total) by mouth daily. 28 tablet 0  . fluticasone (FLONASE)  50 MCG/ACT nasal spray Place 1 spray into both nostrils daily as needed (for allergies.).     Marland Kitchen ipratropium-albuterol (DUONEB) 0.5-2.5 (3) MG/3ML SOLN Take 3 mLs by nebulization every 6 (six) hours. 360 mL 5  . levothyroxine (SYNTHROID) 25 MCG tablet TAKE 1 TABLET (25 MCG TOTAL) BY MOUTH DAILY BEFORE BREAKFAST. (Patient taking differently: Take 75 mcg by mouth daily before breakfast. ) 30 tablet 1  . lidocaine (XYLOCAINE) 2 % solution   2  . lidocaine-prilocaine (EMLA) cream Apply 1 application topically as needed.    . metoprolol succinate (TOPROL-XL) 25 MG 24 hr tablet Take 25 mg by mouth daily.    . Multiple Vitamin (MULTIVITAMIN WITH MINERALS) TABS tablet Take 1 tablet by mouth daily.     . ondansetron (ZOFRAN) 8 MG tablet Take 8 mg by mouth every 8 (eight) hours as needed for nausea or vomiting.    . OXYGEN Inhale 2 L into the lungs at bedtime.     . pantoprazole (PROTONIX) 40 MG tablet Take 40 mg by mouth daily.  3  . potassium chloride (K-DUR) 10 MEQ tablet TAKE 1 TABLET (10 MEQ TOTAL) BY MOUTH ONCE DAILY.  3  . umeclidinium-vilanterol (ANORO ELLIPTA) 62.5-25 MCG/INH AEPB Inhale 1 puff into the lungs daily. 1 each 10  . nitroGLYCERIN (NITROSTAT) 0.4 MG SL tablet Place 0.4 mg under the tongue every 5 (five) minutes as needed  for chest pain.      No current facility-administered medications for this visit.    Facility-Administered Medications Ordered in Other Visits  Medication Dose Route Frequency Provider Last Rate Last Dose  . heparin lock flush 100 unit/mL  500 Units Intravenous Once Grayland Ormond Kathlene November, MD        OBJECTIVE: There were no vitals filed for this visit.   There is no height or weight on file to calculate BMI.    ECOG FS:0 - Asymptomatic  General: Well-developed, well-nourished, no acute distress. HEENT: Normocephalic. Neuro: Alert, answering all questions appropriately. Cranial nerves grossly intact. Psych: Normal affect.  LAB RESULTS:  Lab Results  Component Value Date   NA 130 (L) 07/23/2019   K 4.1 07/23/2019   CL 97 (L) 07/23/2019   CO2 25 07/23/2019   GLUCOSE 103 (H) 07/23/2019   BUN 9 07/23/2019   CREATININE 0.93 07/23/2019   CALCIUM 8.9 07/23/2019   PROT 7.0 07/23/2019   ALBUMIN 4.0 07/23/2019   AST 23 07/23/2019   ALT 26 07/23/2019   ALKPHOS 88 07/23/2019   BILITOT 0.5 07/23/2019   GFRNONAA >60 07/23/2019   GFRAA >60 07/23/2019    Lab Results  Component Value Date   WBC 7.9 07/23/2019   NEUTROABS 6.1 07/23/2019   HGB 14.7 07/23/2019   HCT 41.8 07/23/2019   MCV 95.0 07/23/2019   PLT 221 07/23/2019     STUDIES:  ASSESSMENT: Stage IIIa non-small cell lung cancer, right middle lobe.  PLAN:    1. Stage IIIa non-small cell lung cancer, right middle lobe: Case discussed with pathologist and unable to determine whether this is adenocarcinoma or squamous cell carcinoma.  There is also insufficient tissue to do further testing.  Liquid biopsy did not reveal any actionable mutations.  MRI of the brain completed on Mar 28, 2018 reviewed independently did not reveal metastatic disease.  Patient completed XRT June 26, 2018.  He completed his concurrent single agent carboplatinum on June 21, 2018.  Patient had a reaction to Taxol during cycle 1 and this was  discontinued.  He completed a year-long of maintenance durvalumab on June 27, 2019.  CT scan results from July 23, 2019 reviewed independently and reported as above with no obvious evidence of recurrent or progressive disease.  No intervention is needed at this time.  Return to clinic in 3 months with repeat imaging and further evaluation at which point he can be transitioned to imaging every 6 months.     2.  Secondary polycythemia: Resolved.  Likely secondary to heavy tobacco use.  Previously, the remainder of his laboratory work, including JAK-2 mutation, was either negative or within normal limits.  3.  Right upper lobe pulmonary nodule: CT scan results as above.  Monitor.   4.  Colon polyps:  Patient has a personal history of greater then 10 adenomatous polyps on his most recent conoloscopy. He does not know of any family history of increased polyps or colon cancer.  Genetic testing to assess for the APC mutation for FAP or AFAP was negative. Continue colonoscopies as per GI. 5. Tobacco Use: Patient continues to heavily smoke.  He expressed understanding by continuing tobacco use increases his chance of recurrence. 6. Anxiety: Chronic. Continue Valium 10 mg every 12 hours as needed.  Continue treatment and evaluation per primary care. 7.  Hyponatremia: Chronic and unchanged.  Sodium is 132 today.  Monitor. 8.  Hypothyroidism: Appreciate endocrinology input.  Patient Synthroid dose has been increased to 75 mcg daily.  Patient reports he has follow-up with endocrinology later this week. 9.  CHF: Patient has an EF of 40%.  Continue evaluation and treatment per cardiology. 10.  Weakness and fatigue: Multifactorial.  Appreciate Occupational Therapy input.  Patient has requested in-home physical therapy.  Endocrinology as above.  I provided 15 minutes of face-to-face video visit time during this encounter, and > 50% was spent counseling as documented under my assessment & plan.   Patient  expressed understanding and was in agreement with this plan. He also understands that He can call clinic at any time with any questions, concerns, or complaints.    [name], MD   07/25/2019 9:37 AM

## 2019-07-20 ENCOUNTER — Other Ambulatory Visit: Payer: Self-pay

## 2019-07-22 IMAGING — CT NM PET TUM IMG RESTAG (PS) SKULL BASE T - THIGH
1 of 11 series · 1 of 25 positions shown · non-contrast
Comparison: 03/16/2018 PET-CT.  07/11/2018 chest CT.

CLINICAL DATA: Subsequent treatment strategy for non-small cell
lung cancer diagnosed on subcarinal lymph node biopsy 04/11/2018,
presumed right middle lobe primary. Status post concurrent chemo
radiation therapy completed 06/26/2018. Ongoing maintenance
chemotherapy.

EXAM:
NUCLEAR MEDICINE PET SKULL BASE TO THIGH
TECHNIQUE: 11.1 mCi F-18 FDG was injected intravenously. Full-ring PET imaging
was performed from the skull base to thigh after the radiotracer. CT
data was obtained and used for attenuation correction and anatomic
localization.
Fasting blood glucose: 91 mg/dl

[Series 3: ct wb 5.0 b30f · axial · 5.0mm · 0.98mm/px · 1 of 326 slices shown]
[im 326/326  brain]
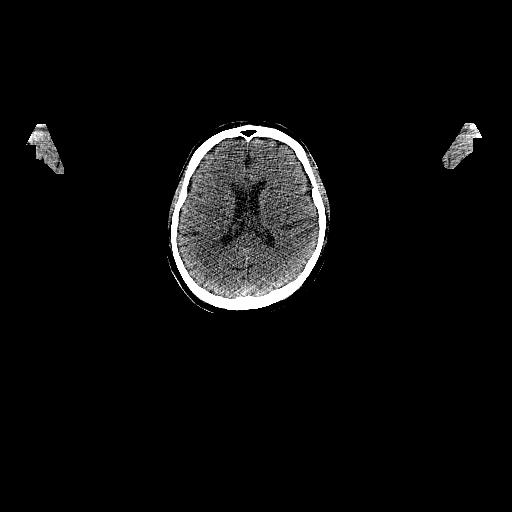

[1 of 25 positions shown; findings below may reference images not displayed]

FINDINGS: Mediastinal blood pool activity: SUV max

NECK: No hypermetabolic lymph nodes in the neck.

Incidental CT findings: none

CHEST:

Nonenlarged hypermetabolic 0.8 cm subcarinal lymph node with max SUV
5.8 (series 3/image 113), previously 1.7 cm with max SUV 12.3 on
03/16/2018 PET-CT, decreased in size and metabolism.

New mild hypermetabolism within nonenlarged 0.7 cm right lower
paratracheal node with max SUV 3.5 (series 3/image 103). New mild
hypermetabolism within bilateral hilar nonenlarged nodes with max
SUV 3.6 in the right hilum and max SUV 3.7 in the left hilum. No
hypermetabolic axillary adenopathy.

There is new intense hypermetabolism associated with a 2.3 x 1.4 cm
irregular solid apical right upper lobe pulmonary nodule with max
SUV 12.0 (series 3/image 85), which likely represents growth of a
0.9 x 0.5 cm apical right upper lobe nodule from the 07/11/2018
chest CT study. More peripheral apical right upper lobe 0.8 cm solid
nodule is stable and demonstrates no significant metabolism.

The previously described 1.4 cm right middle lobe pulmonary nodule
has resolved, with no residual metabolism in this location.

Subsolid 1.5 cm medial apical left upper lobe nodule (series 3/image
86) demonstrates low level metabolism with max SUV 2.6, unchanged in
size from 07/11/2018 chest CT, increased in size from 0.6 cm on
03/16/2018 PET-CT.

Incidental CT findings: No additional significant pulmonary nodules.
Coronary atherosclerosis. Right internal jugular MediPort terminates
in the middle third of the SVC. Atherosclerotic nonaneurysmal
thoracic aorta. Moderate centrilobular emphysema with diffuse
bronchial wall thickening.

ABDOMEN/PELVIS: No abnormal hypermetabolic activity within the
liver, pancreas, adrenal glands, or spleen. No hypermetabolic lymph
nodes in the abdomen or pelvis.

Incidental CT findings: Small simple liver cysts, largest 2.1 cm in
the lateral segment left liver lobe. A few scattered subcentimeter
hypodense liver lesions are too small to characterize and are
unchanged, presumably benign. Punctate nonobstructing 1-2 mm
bilateral renal stones. No hydronephrosis. Atherosclerotic
nonaneurysmal abdominal aorta.

SKELETON: No focal hypermetabolic activity to suggest skeletal
metastasis.

Incidental CT findings: Surgical hardware from ACDF is seen in the
cervical spine.
IMPRESSION: 1. New intense hypermetabolism associated with an enlarging
irregular solid 2.3 cm apical right upper lobe pulmonary nodule,
suspicious for malignancy (either metastatic disease or metachronous
primary bronchogenic carcinoma).
2. Low level metabolism associated with a subsolid 1.5 cm medial
apical left upper lobe pulmonary nodule, stable in size from
07/11/2018 chest CT, increased in size from 03/16/2018 PET-CT,
equivocal for metastasis.
3. Previously described irregular right middle lobe pulmonary nodule
has resolved.
4. Hypermetabolic subcarinal lymph node is decreased in size and
metabolism.
5. New nonspecific mild hypermetabolism within nonenlarged right
paratracheal and bilateral hilar lymph nodes, equivocal for reactive
or metastatic nodes.
6. No hypermetabolic metastatic disease in the neck, abdomen, pelvis
or skeleton.
7. Aortic Atherosclerosis (RLNIX-AEH.H) and Emphysema (RLNIX-IVR.5).

## 2019-07-23 ENCOUNTER — Other Ambulatory Visit: Payer: Self-pay

## 2019-07-23 ENCOUNTER — Encounter: Payer: Self-pay | Admitting: Oncology

## 2019-07-23 ENCOUNTER — Ambulatory Visit
Admission: RE | Admit: 2019-07-23 | Discharge: 2019-07-23 | Disposition: A | Discharge status: Home / Self Care | Payer: BC Managed Care – PPO | Source: Ambulatory Visit | Attending: Oncology | Admitting: Oncology

## 2019-07-23 ENCOUNTER — Inpatient Hospital Stay: Payer: BC Managed Care – PPO | Admitting: *Deleted

## 2019-07-23 DIAGNOSIS — C342 Malignant neoplasm of middle lobe, bronchus or lung: Secondary | ICD-10-CM | POA: Insufficient documentation

## 2019-07-23 DIAGNOSIS — F17210 Nicotine dependence, cigarettes, uncomplicated: Secondary | ICD-10-CM | POA: Insufficient documentation

## 2019-07-23 DIAGNOSIS — Z79899 Other long term (current) drug therapy: Secondary | ICD-10-CM | POA: Insufficient documentation

## 2019-07-23 DIAGNOSIS — I509 Heart failure, unspecified: Secondary | ICD-10-CM | POA: Diagnosis not present

## 2019-07-23 DIAGNOSIS — G4733 Obstructive sleep apnea (adult) (pediatric): Secondary | ICD-10-CM | POA: Insufficient documentation

## 2019-07-23 DIAGNOSIS — F419 Anxiety disorder, unspecified: Secondary | ICD-10-CM | POA: Insufficient documentation

## 2019-07-23 DIAGNOSIS — J449 Chronic obstructive pulmonary disease, unspecified: Secondary | ICD-10-CM | POA: Diagnosis not present

## 2019-07-23 DIAGNOSIS — E039 Hypothyroidism, unspecified: Secondary | ICD-10-CM | POA: Insufficient documentation

## 2019-07-23 DIAGNOSIS — Z452 Encounter for adjustment and management of vascular access device: Secondary | ICD-10-CM | POA: Diagnosis present

## 2019-07-23 DIAGNOSIS — C3491 Malignant neoplasm of unspecified part of right bronchus or lung: Secondary | ICD-10-CM

## 2019-07-23 DIAGNOSIS — Z923 Personal history of irradiation: Secondary | ICD-10-CM | POA: Diagnosis not present

## 2019-07-23 DIAGNOSIS — E871 Hypo-osmolality and hyponatremia: Secondary | ICD-10-CM | POA: Diagnosis not present

## 2019-07-23 DIAGNOSIS — Z95828 Presence of other vascular implants and grafts: Secondary | ICD-10-CM

## 2019-07-23 LAB — CBC WITH DIFFERENTIAL/PLATELET
Abs Immature Granulocytes: 0.03 10*3/uL (ref 0.00–0.07)
Basophils Absolute: 0.1 10*3/uL (ref 0.0–0.1)
Basophils Relative: 1 %
Eosinophils Absolute: 0.2 10*3/uL (ref 0.0–0.5)
Eosinophils Relative: 2 %
HCT: 41.8 % (ref 39.0–52.0)
Hemoglobin: 14.7 g/dL (ref 13.0–17.0)
Immature Granulocytes: 0 %
Lymphocytes Relative: 11 %
Lymphs Abs: 0.8 10*3/uL (ref 0.7–4.0)
MCH: 33.4 pg (ref 26.0–34.0)
MCHC: 35.2 g/dL (ref 30.0–36.0)
MCV: 95 fL (ref 80.0–100.0)
Monocytes Absolute: 0.7 10*3/uL (ref 0.1–1.0)
Monocytes Relative: 8 %
Neutro Abs: 6.1 10*3/uL (ref 1.7–7.7)
Neutrophils Relative %: 78 %
Platelets: 221 10*3/uL (ref 150–400)
RBC: 4.4 MIL/uL (ref 4.22–5.81)
RDW: 12.1 % (ref 11.5–15.5)
WBC: 7.9 10*3/uL (ref 4.0–10.5)
nRBC: 0 % (ref 0.0–0.2)

## 2019-07-23 LAB — COMPREHENSIVE METABOLIC PANEL
ALT: 26 U/L (ref 0–44)
AST: 23 U/L (ref 15–41)
Albumin: 4 g/dL (ref 3.5–5.0)
Alkaline Phosphatase: 88 U/L (ref 38–126)
Anion gap: 8 (ref 5–15)
BUN: 9 mg/dL (ref 8–23)
CO2: 25 mmol/L (ref 22–32)
Calcium: 8.9 mg/dL (ref 8.9–10.3)
Chloride: 97 mmol/L — ABNORMAL LOW (ref 98–111)
Creatinine, Ser: 0.93 mg/dL (ref 0.61–1.24)
GFR calc Af Amer: >60 mL/min (ref >60)
GFR calc non Af Amer: >60 mL/min (ref >60)
Glucose, Bld: 103 mg/dL — ABNORMAL HIGH (ref 70–99)
Potassium: 4.1 mmol/L (ref 3.5–5.1)
Sodium: 130 mmol/L — ABNORMAL LOW (ref 135–145)
Total Bilirubin: 0.5 mg/dL (ref 0.3–1.2)
Total Protein: 7 g/dL (ref 6.5–8.1)

## 2019-07-23 MED ORDER — SODIUM CHLORIDE 0.9% FLUSH
10.0000 mL | Freq: Once | INTRAVENOUS | Status: AC
  Administered 2019-07-23: 10 mL via INTRAVENOUS
  Filled 2019-07-23: qty 10

## 2019-07-23 MED ORDER — HEPARIN SOD (PORK) LOCK FLUSH 100 UNIT/ML IV SOLN
500.0000 [IU] | Freq: Once | INTRAVENOUS | Status: AC
  Administered 2019-07-23: 500 [IU] via INTRAVENOUS

## 2019-07-23 MED ORDER — IOHEXOL 300 MG/ML  SOLN
75.0000 mL | Freq: Once | INTRAMUSCULAR | Status: AC | PRN
  Administered 2019-07-23: 75 mL via INTRAVENOUS

## 2019-07-23 NOTE — Progress Notes (Signed)
Patient pre screened for office appointment, no questions or concerns today. 

## 2019-07-24 ENCOUNTER — Inpatient Hospital Stay: Payer: BC Managed Care – PPO | Attending: Oncology | Admitting: Oncology

## 2019-07-24 DIAGNOSIS — C3491 Malignant neoplasm of unspecified part of right bronchus or lung: Secondary | ICD-10-CM

## 2019-07-31 ENCOUNTER — Other Ambulatory Visit: Payer: Self-pay | Admitting: *Deleted

## 2019-07-31 ENCOUNTER — Other Ambulatory Visit: Payer: Self-pay | Admitting: Oncology

## 2019-07-31 MED ORDER — DIAZEPAM 5 MG PO TABS: 10.0000 mg | ORAL_TABLET | Freq: Two times a day (BID) | ORAL | 1 refills | Status: DC | PRN

## 2019-07-31 NOTE — Progress Notes (Signed)
Patient called cancer center requesting refill of Valium q 12 hours prn for anxiety.   As mandated by the [place] STOP Act (Strengthen Opioid Misuse Prevention), the [place] Controlled Substance Reporting System ([place]) was reviewed for this patient.  Below is the past 45-months of controlled substance prescriptions as displayed by the registry.  I have personally consulted with my supervising physician, Dr. Grayland Ormond, who agrees that continuation of opiate therapy is medically appropriate at this time and agrees to provide continual monitoring, including urine/blood drug screens, as indicated. Refill is appropriate on or after 07/17/19.  NCCSRS reviewed: Reviewed and appropriate.    Faythe Casa, NP 07/31/2019 2:39 PM [phone]

## 2019-07-31 NOTE — Telephone Encounter (Signed)
Entered in error

## 2019-08-13 ENCOUNTER — Telehealth: Payer: Self-pay | Admitting: Urology

## 2019-08-13 MED ORDER — FESOTERODINE FUMARATE ER 4 MG PO TB24: 4.0000 mg | ORAL_TABLET | Freq: Every day | ORAL | 3 refills | Status: DC

## 2019-08-13 NOTE — Telephone Encounter (Signed)
Lisbeth Ply rx sent to pharmacy.  Tried to contact patient but unable to reach.  Will send mychart message letting him know.

## 2019-08-13 NOTE — Telephone Encounter (Signed)
Pt got samples of Toviaz 4mg  last visit w/[facility].  He would like a new RX sent to [facility] in [place] for 90 day supply.

## 2019-08-17 ENCOUNTER — Other Ambulatory Visit: Payer: Self-pay

## 2019-08-17 DIAGNOSIS — Z20822 Contact with and (suspected) exposure to covid-19: Secondary | ICD-10-CM

## 2019-08-18 LAB — NOVEL CORONAVIRUS, NAA: SARS-CoV-2, NAA: NOT DETECTED

## 2019-09-03 ENCOUNTER — Other Ambulatory Visit: Payer: Self-pay

## 2019-09-03 DIAGNOSIS — Z20822 Contact with and (suspected) exposure to covid-19: Secondary | ICD-10-CM

## 2019-09-04 ENCOUNTER — Telehealth: Payer: Self-pay | Admitting: Internal Medicine

## 2019-09-04 DIAGNOSIS — J449 Chronic obstructive pulmonary disease, unspecified: Secondary | ICD-10-CM

## 2019-09-04 LAB — NOVEL CORONAVIRUS, NAA: SARS-CoV-2, NAA: NOT DETECTED

## 2019-09-04 MED ORDER — ANORO ELLIPTA 62.5-25 MCG/INH IN AEPB: 1.0000 | INHALATION_SPRAY | Freq: Every day | RESPIRATORY_TRACT | 10 refills | Status: DC

## 2019-09-04 NOTE — Telephone Encounter (Signed)
Spoke to pt,w ho is requesting refill for Anoro.  Rx has been sent to preferred pharmacy. Nothing further is needed.

## 2019-09-04 NOTE — Telephone Encounter (Signed)
Pt returning missed call and would like a call back

## 2019-09-04 NOTE — Telephone Encounter (Signed)
Left message for pt

## 2019-09-06 ENCOUNTER — Telehealth: Payer: Self-pay | Admitting: Internal Medicine

## 2019-09-06 NOTE — Telephone Encounter (Signed)
anoro coupon ha sbene placed in outgoing mail.Marland Kitchen  Pt aware and voiced his understanding.  Nothing further is needed.

## 2019-09-11 ENCOUNTER — Other Ambulatory Visit: Payer: Self-pay | Admitting: *Deleted

## 2019-09-11 MED ORDER — DIAZEPAM 5 MG PO TABS: 10.0000 mg | ORAL_TABLET | Freq: Two times a day (BID) | ORAL | 0 refills | Status: DC | PRN

## 2019-09-17 ENCOUNTER — Inpatient Hospital Stay: Payer: BC Managed Care – PPO

## 2019-09-19 ENCOUNTER — Telehealth: Payer: Self-pay

## 2019-09-19 NOTE — Telephone Encounter (Signed)
Telephone call to patient about SCP visit.   Patient in agreement for me to mail him the [facility] packet and I will call him on Dec. 2nd to review information and discuss post cancer care.  Packet mailed.

## 2019-10-03 ENCOUNTER — Inpatient Hospital Stay: Payer: BC Managed Care – PPO | Attending: Oncology | Admitting: Oncology

## 2019-10-03 ENCOUNTER — Other Ambulatory Visit: Payer: Self-pay

## 2019-10-03 DIAGNOSIS — Z9221 Personal history of antineoplastic chemotherapy: Secondary | ICD-10-CM | POA: Insufficient documentation

## 2019-10-03 DIAGNOSIS — J449 Chronic obstructive pulmonary disease, unspecified: Secondary | ICD-10-CM | POA: Insufficient documentation

## 2019-10-03 DIAGNOSIS — Z85118 Personal history of other malignant neoplasm of bronchus and lung: Secondary | ICD-10-CM | POA: Insufficient documentation

## 2019-10-03 DIAGNOSIS — Z79899 Other long term (current) drug therapy: Secondary | ICD-10-CM | POA: Insufficient documentation

## 2019-10-03 DIAGNOSIS — F419 Anxiety disorder, unspecified: Secondary | ICD-10-CM | POA: Insufficient documentation

## 2019-10-03 DIAGNOSIS — Z7952 Long term (current) use of systemic steroids: Secondary | ICD-10-CM | POA: Insufficient documentation

## 2019-10-03 DIAGNOSIS — I7300 Raynaud's syndrome without gangrene: Secondary | ICD-10-CM | POA: Insufficient documentation

## 2019-10-03 DIAGNOSIS — J40 Bronchitis, not specified as acute or chronic: Secondary | ICD-10-CM | POA: Insufficient documentation

## 2019-10-03 DIAGNOSIS — F17210 Nicotine dependence, cigarettes, uncomplicated: Secondary | ICD-10-CM | POA: Insufficient documentation

## 2019-10-03 DIAGNOSIS — C3491 Malignant neoplasm of unspecified part of right bronchus or lung: Secondary | ICD-10-CM

## 2019-10-03 DIAGNOSIS — Z8042 Family history of malignant neoplasm of prostate: Secondary | ICD-10-CM | POA: Insufficient documentation

## 2019-10-03 DIAGNOSIS — G4733 Obstructive sleep apnea (adult) (pediatric): Secondary | ICD-10-CM | POA: Insufficient documentation

## 2019-10-03 DIAGNOSIS — I10 Essential (primary) hypertension: Secondary | ICD-10-CM | POA: Insufficient documentation

## 2019-10-03 NOTE — Progress Notes (Signed)
Survivorship Care Plan visit completed.  Treatment summary reviewed and previously mailed to patient.  ASCO answers booklet reviewed and given to patient.  CARE program and Cancer Transitions discussed with patient along with other resources cancer center offers to patients and caregivers.  Patient verbalized understanding.

## 2019-10-08 ENCOUNTER — Telehealth: Payer: Self-pay | Admitting: *Deleted

## 2019-10-08 DIAGNOSIS — R059 Cough, unspecified: Secondary | ICD-10-CM

## 2019-10-08 DIAGNOSIS — R05 Cough: Secondary | ICD-10-CM

## 2019-10-08 NOTE — Telephone Encounter (Addendum)
Message received from pt's wife asking if patient could get antibiotics for cough which they think could be bronchitis. Pt is afebrile. Symptoms are similar to when he had bronchitis in the past. Pt has CT scheduled for 12/22 and pt would like to have symptoms resolved prior to CT scan if possible.   Please advise.

## 2019-10-08 NOTE — Telephone Encounter (Signed)
Would prefer he get checked out at urgent care.

## 2019-10-09 ENCOUNTER — Ambulatory Visit
Admission: RE | Admit: 2019-10-09 | Discharge: 2019-10-09 | Disposition: A | Discharge status: Home / Self Care | Payer: BC Managed Care – PPO | Source: Ambulatory Visit | Attending: Oncology | Admitting: Oncology

## 2019-10-09 ENCOUNTER — Inpatient Hospital Stay (HOSPITAL_BASED_OUTPATIENT_CLINIC_OR_DEPARTMENT_OTHER): Payer: BC Managed Care – PPO | Admitting: Oncology

## 2019-10-09 ENCOUNTER — Other Ambulatory Visit: Payer: Self-pay

## 2019-10-09 DIAGNOSIS — C3491 Malignant neoplasm of unspecified part of right bronchus or lung: Secondary | ICD-10-CM | POA: Diagnosis not present

## 2019-10-09 DIAGNOSIS — F17210 Nicotine dependence, cigarettes, uncomplicated: Secondary | ICD-10-CM

## 2019-10-09 DIAGNOSIS — R05 Cough: Secondary | ICD-10-CM

## 2019-10-09 DIAGNOSIS — J40 Bronchitis, not specified as acute or chronic: Secondary | ICD-10-CM | POA: Diagnosis not present

## 2019-10-09 DIAGNOSIS — R059 Cough, unspecified: Secondary | ICD-10-CM

## 2019-10-09 MED ORDER — CHERATUSSIN AC 100-10 MG/5ML PO SOLN: 5.0000 mL | Freq: Three times a day (TID) | ORAL | 0 refills | Status: DC | PRN

## 2019-10-09 MED ORDER — AZITHROMYCIN 250 MG PO TABS: ORAL_TABLET | ORAL | 0 refills | Status: DC

## 2019-10-09 MED ORDER — PREDNISONE 10 MG (21) PO TBPK: ORAL_TABLET | ORAL | 0 refills | Status: DC

## 2019-10-09 NOTE — Telephone Encounter (Signed)
Agreed, I will get him scheduled for a virtual visit in [facility]. Thanks!

## 2019-10-09 NOTE — Telephone Encounter (Signed)
Virtual with [facility] is fine.  The catch is with his symptoms it could actually be COVID.

## 2019-10-09 NOTE — Progress Notes (Signed)
Symptom Management Consult note [facility]  [phone] [phone]  Patient Care Team: Kirk Ruths, MD as PCP - General (Internal Medicine) [name], MD as Referring Physician (Vascular Surgery) [name], MD as Consulting Physician (Oncology) [name], MD as Referring Physician (Radiation Oncology)   Name of the patient: Paul Carpenter  [id number]  [birth year]   Date of visit: 10/09/2019   Diagnosis- Lung Cancer   Chief complaint/ Reason for visit- Cough   Heme/Onc history:  Oncology History  Non-small cell lung cancer, right ([place])  04/24/2018 Initial Diagnosis   Non-small cell lung cancer, right ([place])   04/28/2018 Cancer Staging   Staging form: Lung, AJCC 8th Edition - Clinical stage from 04/28/2018: Stage IIIA (cT1b, cN2, cM0) - Signed by [name], MD on 04/28/2018   04/28/2018 - 06/27/2018 Chemotherapy   The patient had palonosetron (ALOXI) injection 0.25 mg, 0.25 mg, Intravenous,  Once, 8 of 8 cycles Administration: 0.25 mg (05/03/2018), 0.25 mg (05/11/2018), 0.25 mg (06/07/2018), 0.25 mg (06/14/2018), 0.25 mg (06/21/2018), 0.25 mg (05/17/2018), 0.25 mg (05/24/2018), 0.25 mg (05/31/2018) CARBOplatin (PARAPLATIN) 300 mg in sodium chloride 0.9 % 100 mL chemo infusion, 300 mg (100 % of original dose 299.4 mg), Intravenous,  Once, 8 of 8 cycles Dose modification:   (original dose 299.4 mg, Cycle 1) Administration: 300 mg (05/11/2018), 300 mg (06/07/2018), 300 mg (06/14/2018), 300 mg (06/21/2018), 300 mg (05/17/2018), 300 mg (05/24/2018), 300 mg (05/31/2018) PACLitaxel (TAXOL) 96 mg in sodium chloride 0.9 % 250 mL chemo infusion (</= 80mg /m2), 45 mg/m2 = 96 mg, Intravenous,  Once, 1 of 1 cycle Administration: 96 mg (05/03/2018)  for chemotherapy treatment.    07/12/2018 -  Chemotherapy   The patient had durvalumab (IMFINZI) 1,000 mg in sodium chloride 0.9 % 100 mL chemo infusion, 920 mg, Intravenous,  Once, 26 of 26  cycles Administration: 1,000 mg (07/12/2018), 1,000 mg (07/26/2018), 1,000 mg (08/09/2018), 1,000 mg (08/23/2018), 1,000 mg (09/06/2018), 1,000 mg (09/20/2018), 1,000 mg (10/04/2018), 1,000 mg (10/18/2018), 1,000 mg (11/02/2018), 1,000 mg (11/15/2018), 1,000 mg (11/29/2018), 1,000 mg (12/13/2018), 1,000 mg (12/27/2018), 1,000 mg (01/10/2019), 1,000 mg (01/24/2019), 1,000 mg (02/07/2019), 1,000 mg (02/21/2019), 1,000 mg (03/07/2019), 1,000 mg (03/21/2019), 1,000 mg (04/04/2019), 1,000 mg (04/18/2019), 1,000 mg (05/02/2019), 1,000 mg (05/16/2019), 1,000 mg (05/30/2019), 1,000 mg (06/13/2019), 1,000 mg (06/27/2019)  for chemotherapy treatment.     Interval history-patient presents to symptom management virtually today for complaints of a cough.  States he has been battling this cough since August 06, 2019.  He and his wife were both treated for bronchitis with antibiotics for approximately 2 weeks and both improved.  Patient's wife continues to improve but unfortunately he developed a cough and runny nose about 2 or 3 days ago.  He has tried OTC Benadryl and Robitussin without relief.  He denies any fevers but admits to yellow/frothy white sputum production.  Cough is worse at night and it is keeping him up.  He has been tested twice for Covid and both results were negative.  He denies any known exposures to COVID-19 positive patients.  He and his wife maintain social distancing and wear masks while in public.  He was last seen in our clinic on 07/24/2019 to review results from recent PET scan.  PET scan revealed no evidence of recurrent or progressive disease.  No intervention was needed at that time.  He was scheduled to return to clinic in approximately 3 months with repeat imaging and  further evaluation.  He is scheduled to be seen at the end of this month for evaluation and review of imaging.  ECOG FS:1 - Symptomatic but completely ambulatory  Review of systems- Review of Systems  Constitutional: Positive for malaise/fatigue.  Negative for chills, fever and weight loss.  HENT: Negative for congestion, ear pain and tinnitus.   Eyes: Negative.  Negative for blurred vision and double vision.  Respiratory: Positive for cough, sputum production and shortness of breath.   Cardiovascular: Negative.  Negative for chest pain, palpitations and leg swelling.  Gastrointestinal: Negative.  Negative for abdominal pain, constipation, diarrhea, nausea and vomiting.  Genitourinary: Negative for dysuria, frequency and urgency.  Musculoskeletal: Negative for back pain and falls.  Skin: Negative.  Negative for rash.  Neurological: Positive for weakness. Negative for headaches.  Endo/Heme/Allergies: Negative.  Does not bruise/bleed easily.  Psychiatric/Behavioral: Negative.  Negative for depression. The patient is not nervous/anxious and does not have insomnia.      Current treatment-on surveillance.  Allergies  Allergen Reactions   Lisinopril Rash   Varenicline Rash     Past Medical History:  Diagnosis Date   Anginal pain (HCC)    Anxiety    Chest pain    CHF (congestive heart failure) (HCC)    Chicken pox    Complication of anesthesia    o2 dropped after neck fusion   COPD (chronic obstructive pulmonary disease) (HCC)    Coronary artery disease    Cough    chronic  clear phlegm   Dysrhythmia    palpitations   GERD (gastroesophageal reflux disease)    h/o reflux/ hoarsness   Hematochezia    Hemorrhoids    History of chickenpox    History of colon polyps    History of Helicobacter pylori infection    Hoarseness    Hypertension    Lung cancer ([place]) 05/2016   Chemo + rad tx's.    Migraines    OSA (obstructive sleep apnea)    has CPAP but does not use   Personal history of tobacco use, presenting hazards to health 03/05/2016   Pneumonia    5/17   Raynaud disease    Raynaud disease    Raynaud's disease    Rotator cuff tear    on right   Shortness of breath dyspnea    Sleep  apnea    Ulcer (traumatic) of oral mucosa      Past Surgical History:  Procedure Laterality Date   BACK SURGERY     cervical fusion x 2   CARDIAC CATHETERIZATION     CERVICAL DISCECTOMY     x 2   COLONOSCOPY     COLONOSCOPY N/A 07/25/2015   Procedure: COLONOSCOPY;  Surgeon: Lollie Sails, MD;  Location: [facility] ENDOSCOPY;  Service: Endoscopy;  Laterality: N/A;   COLONOSCOPY WITH PROPOFOL N/A 10/04/2017   Procedure: COLONOSCOPY WITH PROPOFOL;  Surgeon: Lollie Sails, MD;  Location: [facility] ENDOSCOPY;  Service: Endoscopy;  Laterality: N/A;   ELECTROMAGNETIC NAVIGATION BROCHOSCOPY Left 06/28/2016   Procedure: ELECTROMAGNETIC NAVIGATION BRONCHOSCOPY;  Surgeon: Vilinda Boehringer, MD;  Location: [facility] ORS;  Service: Cardiopulmonary;  Laterality: Left;   ENDOBRONCHIAL ULTRASOUND N/A 04/11/2018   Procedure: ENDOBRONCHIAL ULTRASOUND;  Surgeon: [name], MD;  Location: [facility] ORS;  Service: Cardiopulmonary;  Laterality: N/A;   ESOPHAGOGASTRODUODENOSCOPY N/A 07/25/2015   Procedure: ESOPHAGOGASTRODUODENOSCOPY (EGD);  Surgeon: Lollie Sails, MD;  Location: [facility] ENDOSCOPY;  Service: Endoscopy;  Laterality: N/A;   NASAL SINUS SURGERY  x 2    PORTA CATH INSERTION N/A 04/24/2018   Procedure: PORTA CATH INSERTION;  Surgeon: Algernon Huxley, MD;  Location: [facility];  Service: Cardiovascular;  Laterality: N/A;   rotator cuff surgery Right    07/2016   SEPTOPLASTY     SKIN GRAFT      Social History   Socioeconomic History   Marital status: Married    Spouse name: Not on file   Number of children: 3   Years of education: Not on file   Highest education level: Not on file  Occupational History   Occupation: Architect Work  Scientist, product/process development strain: Not on file   Food insecurity    Worry: Not on file    Inability: Not on Lexicographer needs    Medical: Not on file    Non-medical: Not on file  Tobacco Use   Smoking status:  Current Every Day Smoker    Packs/day: 1.50    Years: 45.00    Pack years: 67.50    Types: Cigarettes   Smokeless tobacco: Never Used  Substance and Sexual Activity   Alcohol use: Yes    Alcohol/week: 2.0 standard drinks    Types: 2 Standard drinks or equivalent per week    Comment: moderate   Drug use: No   Sexual activity: Not on file  Lifestyle   Physical activity    Days per week: Not on file    Minutes per session: Not on file   Stress: Not on file  Relationships   Social connections    Talks on phone: Not on file    Gets together: Not on file    Attends religious service: Not on file    Active member of club or organization: Not on file    Attends meetings of clubs or organizations: Not on file    Relationship status: Not on file   Intimate partner violence    Fear of current or ex partner: Not on file    Emotionally abused: Not on file    Physically abused: Not on file    Forced sexual activity: Not on file  Other Topics Concern   Not on file  Social History Narrative   Not on file    Family History  Problem Relation Age of Onset   Heart disease Father    Prostate cancer Father    Heart disease Paternal Grandmother    Heart attack Maternal Grandfather 15   Kidney cancer Neg Hx    Bladder Cancer Neg Hx    Other Neg Hx        pituitary abnormality     Current Outpatient Medications:    acetaminophen (TYLENOL) 500 MG tablet, Take 1,000 mg by mouth every 6 (six) hours as needed (for pain.)., Disp: , Rfl:    albuterol (PROAIR HFA) 108 (90 Base) MCG/ACT inhaler, Inhale 2 puffs into the lungs every 4 (four) hours as needed for wheezing or shortness of breath., Disp: 1 Inhaler, Rfl: 1   atorvastatin (LIPITOR) 40 MG tablet, Take 40 mg by mouth at bedtime. , Disp: , Rfl:    azithromycin (ZITHROMAX Z-PAK) 250 MG tablet, Take two tablets on day one and one tablet days 2-5., Disp: 6 each, Rfl: 0   busPIRone (BUSPAR) 15 MG tablet, Take 15 mg by  mouth 2 (two) times daily. , Disp: , Rfl: 10   diazepam (VALIUM) 5 MG tablet, Take 2 tablets (10 mg total)  by mouth every 12 (twelve) hours as needed for anxiety., Disp: 60 tablet, Rfl: 0   DULoxetine (CYMBALTA) 60 MG capsule, Take 1 capsule by mouth 1 day or 1 dose., Disp: , Rfl:    fesoterodine (TOVIAZ) 4 MG TB24 tablet, Take 1 tablet (4 mg total) by mouth daily., Disp: 90 tablet, Rfl: 3   fluticasone (FLONASE) 50 MCG/ACT nasal spray, Place 1 spray into both nostrils daily as needed (for allergies.). , Disp: , Rfl:    guaiFENesin-codeine (CHERATUSSIN AC) 100-10 MG/5ML syrup, Take 5 mLs by mouth 3 (three) times daily as needed for cough., Disp: 120 mL, Rfl: 0   ipratropium-albuterol (DUONEB) 0.5-2.5 (3) MG/3ML SOLN, Take 3 mLs by nebulization every 6 (six) hours., Disp: 360 mL, Rfl: 5   levothyroxine (SYNTHROID) 25 MCG tablet, TAKE 1 TABLET (25 MCG TOTAL) BY MOUTH DAILY BEFORE BREAKFAST. (Patient taking differently: Take 75 mcg by mouth daily before breakfast. ), Disp: 30 tablet, Rfl: 1   lidocaine (XYLOCAINE) 2 % solution, , Disp: , Rfl: 2   lidocaine-prilocaine (EMLA) cream, Apply 1 application topically as needed., Disp: , Rfl:    metoprolol succinate (TOPROL-XL) 25 MG 24 hr tablet, Take 25 mg by mouth daily., Disp: , Rfl:    Multiple Vitamin (MULTIVITAMIN WITH MINERALS) TABS tablet, Take 1 tablet by mouth daily. , Disp: , Rfl:    nitroGLYCERIN (NITROSTAT) 0.4 MG SL tablet, Place 0.4 mg under the tongue every 5 (five) minutes as needed for chest pain. , Disp: , Rfl:    ondansetron (ZOFRAN) 8 MG tablet, Take 8 mg by mouth every 8 (eight) hours as needed for nausea or vomiting., Disp: , Rfl:    OXYGEN, Inhale 2 L into the lungs at bedtime. , Disp: , Rfl:    pantoprazole (PROTONIX) 40 MG tablet, Take 40 mg by mouth daily., Disp: , Rfl: 3   potassium chloride (K-DUR) 10 MEQ tablet, TAKE 1 TABLET (10 MEQ TOTAL) BY MOUTH ONCE DAILY., Disp: , Rfl: 3   predniSONE (STERAPRED UNI-PAK 21  TAB) 10 MG (21) TBPK tablet, Take as directed., Disp: 21 tablet, Rfl: 0   umeclidinium-vilanterol (ANORO ELLIPTA) 62.5-25 MCG/INH AEPB, Inhale 1 puff into the lungs daily., Disp: 1 each, Rfl: 10 No current facility-administered medications for this visit.   Facility-Administered Medications Ordered in Other Visits:    heparin lock flush 100 unit/mL, 500 Units, Intravenous, Once, [name], Kathlene November, MD  Physical exam: There were no vitals filed for this visit. Limited due to telephone visit.  CMP Latest Ref Rng & Units 07/23/2019  Glucose 70 - 99 mg/dL 103(H)  BUN 8 - 23 mg/dL 9  Creatinine 0.61 - 1.24 mg/dL 0.93  Sodium 135 - 145 mmol/L 130(L)  Potassium 3.5 - 5.1 mmol/L 4.1  Chloride 98 - 111 mmol/L 97(L)  CO2 22 - 32 mmol/L 25  Calcium 8.9 - 10.3 mg/dL 8.9  Total Protein 6.5 - 8.1 g/dL 7.0  Total Bilirubin 0.3 - 1.2 mg/dL 0.5  Alkaline Phos 38 - 126 U/L 88  AST 15 - 41 U/L 23  ALT 0 - 44 U/L 26   CBC Latest Ref Rng & Units 07/23/2019  WBC 4.0 - 10.5 K/uL 7.9  Hemoglobin 13.0 - 17.0 g/dL 14.7  Hematocrit 39.0 - 52.0 % 41.8  Platelets 150 - 400 K/uL 221    No images are attached to the encounter.  Dg Chest 2 View  Result Date: 10/09/2019 CLINICAL DATA:  Cough, shortness of breath EXAM: CHEST - 2 VIEW COMPARISON:  08/23/2018 FINDINGS: The heart size and mediastinal contours are within normal limits. Right chest port catheter. Both lungs are clear. Disc degenerative disease of the thoracic spine. IMPRESSION: No acute abnormality of the lungs. Electronically Signed   By: Eddie Candle M.D.   On: 10/09/2019 11:47   Assessment and plan- Patient is a 63 y.o. male who presents to symptom management for persistent cough for 2.5 months.   Lung cancer: Status post single agent carbo in August 2019.  Unfortunately had a reaction and this was discontinued.  He completed a year-long of maintenance nivolumab in June 27, 2019.  CT results from 07/23/2019 revealed no obvious evidence of  recurrent or progressive disease.  No intervention is needed at this time.  He has repeat imaging and assessment at the end of this month.  Cough: Unclear etiology.  Differentials include COVID-19, recurrent lung cancer, pneumonia or bronchitis.  He is afebrile.  He has had two negative COVID-19 test.  Will get chest x-ray.  He was previously treated with amoxicillin per wife with marked improvement.  He apparently has history of chronic bronchitis but I cannot see where he was seen for this recently.   Plan: Stat chest x-ray.  No acute abnormality. Rx Cheratussin twice daily as needed for cough. Rx prednisone taper x5 days. This is likely viral. Can trial antibiotics given length of presentation and history of lung cancer. He is a current smoker. Rx Z-Pak.   Disposition: Return to clinic as needed or if symptoms worsen.  If symptoms are progressive, COVID-19 test will be necessary. Return to clinic at the end of the month to review imaging and see Dr. Grayland Ormond.   Visit Diagnosis 1. Non-small cell lung cancer, right ([place])   2. Cigarette smoker   3. Bronchitis     Patient expressed understanding and was in agreement with this plan. He also understands that He can call clinic at any time with any questions, concerns, or complaints.   Greater than 50% was spent in counseling and coordination of care with this patient including but not limited to discussion of the relevant topics above (See A&P) including, but not limited to diagnosis and management of acute and chronic medical conditions.   Thank you for allowing me to participate in the care of this very pleasant patient.    Jacquelin Hawking, NP [facility] Cell - [phone] Pager- [phone] 10/09/2019 4:34 PM

## 2019-10-09 NOTE — Telephone Encounter (Signed)
Spoke with pt's wife and she stated that pt prefers to avoid urgent cares at this time due to covid and the next available appt with his PCP is in 2 weeks.   Could he do a virtual visit in [facility]?

## 2019-10-12 NOTE — Progress Notes (Signed)
Patient did not answer phone for our visit.   Will attempt again later.  Faythe Casa, NP 10/12/2019 3:35 PM

## 2019-10-23 ENCOUNTER — Other Ambulatory Visit: Payer: Self-pay

## 2019-10-23 ENCOUNTER — Ambulatory Visit
Admission: RE | Admit: 2019-10-23 | Discharge: 2019-10-23 | Disposition: A | Discharge status: Home / Self Care | Payer: BC Managed Care – PPO | Source: Ambulatory Visit | Attending: Oncology | Admitting: Oncology

## 2019-10-23 ENCOUNTER — Inpatient Hospital Stay: Payer: BC Managed Care – PPO

## 2019-10-23 DIAGNOSIS — Z95828 Presence of other vascular implants and grafts: Secondary | ICD-10-CM

## 2019-10-23 DIAGNOSIS — F17210 Nicotine dependence, cigarettes, uncomplicated: Secondary | ICD-10-CM | POA: Diagnosis not present

## 2019-10-23 DIAGNOSIS — G4733 Obstructive sleep apnea (adult) (pediatric): Secondary | ICD-10-CM | POA: Diagnosis not present

## 2019-10-23 DIAGNOSIS — Z9221 Personal history of antineoplastic chemotherapy: Secondary | ICD-10-CM | POA: Diagnosis not present

## 2019-10-23 DIAGNOSIS — C3491 Malignant neoplasm of unspecified part of right bronchus or lung: Secondary | ICD-10-CM

## 2019-10-23 DIAGNOSIS — Z85118 Personal history of other malignant neoplasm of bronchus and lung: Secondary | ICD-10-CM | POA: Diagnosis present

## 2019-10-23 DIAGNOSIS — Z79899 Other long term (current) drug therapy: Secondary | ICD-10-CM | POA: Diagnosis not present

## 2019-10-23 DIAGNOSIS — J40 Bronchitis, not specified as acute or chronic: Secondary | ICD-10-CM | POA: Diagnosis not present

## 2019-10-23 DIAGNOSIS — Z8042 Family history of malignant neoplasm of prostate: Secondary | ICD-10-CM | POA: Diagnosis not present

## 2019-10-23 DIAGNOSIS — F419 Anxiety disorder, unspecified: Secondary | ICD-10-CM | POA: Diagnosis not present

## 2019-10-23 DIAGNOSIS — I10 Essential (primary) hypertension: Secondary | ICD-10-CM | POA: Diagnosis not present

## 2019-10-23 DIAGNOSIS — J449 Chronic obstructive pulmonary disease, unspecified: Secondary | ICD-10-CM | POA: Diagnosis not present

## 2019-10-23 DIAGNOSIS — Z7952 Long term (current) use of systemic steroids: Secondary | ICD-10-CM | POA: Diagnosis not present

## 2019-10-23 DIAGNOSIS — I7300 Raynaud's syndrome without gangrene: Secondary | ICD-10-CM | POA: Diagnosis not present

## 2019-10-23 LAB — BASIC METABOLIC PANEL
Anion gap: 8 (ref 5–15)
BUN: 7 mg/dL — ABNORMAL LOW (ref 8–23)
CO2: 25 mmol/L (ref 22–32)
Calcium: 8.9 mg/dL (ref 8.9–10.3)
Chloride: 101 mmol/L (ref 98–111)
Creatinine, Ser: 0.79 mg/dL (ref 0.61–1.24)
GFR calc Af Amer: >60 mL/min (ref >60)
GFR calc non Af Amer: >60 mL/min (ref >60)
Glucose, Bld: 92 mg/dL (ref 70–99)
Potassium: 3.9 mmol/L (ref 3.5–5.1)
Sodium: 134 mmol/L — ABNORMAL LOW (ref 135–145)

## 2019-10-23 MED ORDER — SODIUM CHLORIDE 0.9% FLUSH
10.0000 mL | Freq: Once | INTRAVENOUS | Status: AC
  Administered 2019-10-23: 10 mL via INTRAVENOUS
  Filled 2019-10-23: qty 10

## 2019-10-23 MED ORDER — IOHEXOL 300 MG/ML  SOLN
75.0000 mL | Freq: Once | INTRAMUSCULAR | Status: AC | PRN
  Administered 2019-10-23: 11:00:00 75 mL via INTRAVENOUS

## 2019-10-23 MED ORDER — HEPARIN SOD (PORK) LOCK FLUSH 100 UNIT/ML IV SOLN
500.0000 [IU] | Freq: Once | INTRAVENOUS | Status: AC
  Administered 2019-10-23: 11:00:00 500 [IU] via INTRAVENOUS
  Filled 2019-10-23: qty 5

## 2019-10-24 ENCOUNTER — Encounter: Payer: Self-pay | Admitting: Oncology

## 2019-10-24 ENCOUNTER — Ambulatory Visit: Payer: BC Managed Care – PPO | Admitting: Oncology

## 2019-10-24 NOTE — Progress Notes (Signed)
[facility]  [phone] [phone]) [phone]  ID: Paul Carpenter OB: Sep 19, 1956  MR#: [id number]  BCW#:[id number]  Patient Care Team: Kirk Ruths, MD as PCP - General (Internal Medicine) [name], MD as Referring Physician (Vascular Surgery) [name], MD as Consulting Physician (Oncology) [name], MD as Referring Physician (Radiation Oncology)  I connected with Paul Carpenter on 10/29/19 at 10:45 AM EST by video enabled telemedicine visit and verified that I am speaking with the correct person using two identifiers.   I discussed the limitations, risks, security and privacy concerns of performing an evaluation and management service by telemedicine and the availability of in-person appointments. I also discussed with the patient that there may be a patient responsible charge related to this service. The patient expressed understanding and agreed to proceed.   Other persons participating in the visit and their role in the encounter: Patient, MD.  Patient's location: Home. Provider's location: Clinic.  CHIEF COMPLAINT: Stage IIIa non-small cell lung cancer, right middle lobe.  INTERVAL HISTORY: Patient agreed to video enabled telemedicine visit for further evaluation and discussion of his imaging results.  He currently feels well and is asymptomatic.  He does not complain of weakness or fatigue today.  He continues to have a chronic cough. He has no neurologic complaints.  He denies any recent fevers or illnesses.  He does not complain of dysphasia today.  He denies any chest pain, shortness of breath, or hemoptysis. He denies any nausea, vomiting, constipation, or diarrhea. He has no urinary complaints.  Patient offers no further specific complaints today.  REVIEW OF SYSTEMS:   Review of Systems  Constitutional: Positive for malaise/fatigue. Negative for fever and weight loss.  HENT: Negative.  Negative for congestion and  sore throat.   Respiratory: Positive for cough. Negative for hemoptysis and shortness of breath.   Cardiovascular: Negative.  Negative for chest pain and leg swelling.  Gastrointestinal: Negative.  Negative for abdominal pain, blood in stool and melena.  Genitourinary: Negative.  Negative for dysuria.  Musculoskeletal: Negative.  Negative for joint pain.  Skin: Negative.  Negative for rash.  Neurological: Positive for weakness. Negative for dizziness, sensory change, focal weakness and headaches.  Psychiatric/Behavioral: Negative.  The patient is not nervous/anxious.     As per HPI. Otherwise, a complete review of systems is negative.  PAST MEDICAL HISTORY: Past Medical History:  Diagnosis Date  . Anginal pain ([place])   . Anxiety   . Chest pain   . CHF (congestive heart failure) ([place])   . Chicken pox   . Complication of anesthesia    o2 dropped after neck fusion  . COPD (chronic obstructive pulmonary disease) ([place])   . Coronary artery disease   . Cough    chronic  clear phlegm  . Dysrhythmia    palpitations  . GERD (gastroesophageal reflux disease)    h/o reflux/ hoarsness  . Hematochezia   . Hemorrhoids   . History of chickenpox   . History of colon polyps   . History of Helicobacter pylori infection   . Hoarseness   . Hypertension   . Lung cancer ([place]) 05/2016   Chemo + rad tx's.   . Migraines   . OSA (obstructive sleep apnea)    has CPAP but does not use  . Personal history of tobacco use, presenting hazards to health 03/05/2016  . Pneumonia    5/17  . Raynaud disease   . Raynaud disease   .  Raynaud's disease   . Rotator cuff tear    on right  . Shortness of breath dyspnea   . Sleep apnea   . Ulcer (traumatic) of oral mucosa     PAST SURGICAL HISTORY: Past Surgical History:  Procedure Laterality Date  . BACK SURGERY     cervical fusion x 2  . CARDIAC CATHETERIZATION    . CERVICAL DISCECTOMY     x 2  . COLONOSCOPY    . COLONOSCOPY N/A 07/25/2015    Procedure: COLONOSCOPY;  Surgeon: Lollie Sails, MD;  Location: [facility] ENDOSCOPY;  Service: Endoscopy;  Laterality: N/A;  . COLONOSCOPY WITH PROPOFOL N/A 10/04/2017   Procedure: COLONOSCOPY WITH PROPOFOL;  Surgeon: Lollie Sails, MD;  Location: [facility] ENDOSCOPY;  Service: Endoscopy;  Laterality: N/A;  . ELECTROMAGNETIC NAVIGATION BROCHOSCOPY Left 06/28/2016   Procedure: ELECTROMAGNETIC NAVIGATION BRONCHOSCOPY;  Surgeon: Vilinda Boehringer, MD;  Location: [facility] ORS;  Service: Cardiopulmonary;  Laterality: Left;  . ENDOBRONCHIAL ULTRASOUND N/A 04/11/2018   Procedure: ENDOBRONCHIAL ULTRASOUND;  Surgeon: [name], MD;  Location: [facility] ORS;  Service: Cardiopulmonary;  Laterality: N/A;  . ESOPHAGOGASTRODUODENOSCOPY N/A 07/25/2015   Procedure: ESOPHAGOGASTRODUODENOSCOPY (EGD);  Surgeon: Lollie Sails, MD;  Location: [facility] ENDOSCOPY;  Service: Endoscopy;  Laterality: N/A;  . NASAL SINUS SURGERY     x 2   . PORTA CATH INSERTION N/A 04/24/2018   Procedure: PORTA CATH INSERTION;  Surgeon: Algernon Huxley, MD;  Location: [facility] LAB;  Service: Cardiovascular;  Laterality: N/A;  . rotator cuff surgery Right    07/2016  . SEPTOPLASTY    . SKIN GRAFT      FAMILY HISTORY Family History  Problem Relation Age of Onset  . Heart disease Father   . Prostate cancer Father   . Heart disease Paternal Grandmother   . Heart attack Maternal Grandfather 52  . Kidney cancer Neg Hx   . Bladder Cancer Neg Hx   . Other Neg Hx        pituitary abnormality       ADVANCED DIRECTIVES:    HEALTH MAINTENANCE: Social History   Tobacco Use  . Smoking status: Current Every Day Smoker    Packs/day: 1.50    Years: 45.00    Pack years: 67.50    Types: Cigarettes  . Smokeless tobacco: Never Used  Substance Use Topics  . Alcohol use: Yes    Alcohol/week: 2.0 standard drinks    Types: 2 Standard drinks or equivalent per week    Comment: moderate  . Drug use: No     Allergies  Allergen Reactions  .  Lisinopril Rash  . Varenicline Rash    Current Outpatient Medications  Medication Sig Dispense Refill  . propranolol (INNOPRAN XL) 80 MG 24 hr capsule Take by mouth.    Marland Kitchen acetaminophen (TYLENOL) 500 MG tablet Take 1,000 mg by mouth every 6 (six) hours as needed (for pain.).    Marland Kitchen albuterol (PROAIR HFA) 108 (90 Base) MCG/ACT inhaler Inhale 2 puffs into the lungs every 4 (four) hours as needed for wheezing or shortness of breath. 1 Inhaler 1  . atorvastatin (LIPITOR) 40 MG tablet Take 40 mg by mouth at bedtime.     Marland Kitchen azithromycin (ZITHROMAX Z-PAK) 250 MG tablet Take two tablets on day one and one tablet days 2-5. 6 each 0  . busPIRone (BUSPAR) 15 MG tablet Take 15 mg by mouth 2 (two) times daily.   10  . diazepam (VALIUM) 5 MG tablet Take 2 tablets (  10 mg total) by mouth every 12 (twelve) hours as needed for anxiety. 60 tablet 0  . DULoxetine (CYMBALTA) 60 MG capsule Take 1 capsule by mouth 1 day or 1 dose.    . fesoterodine (TOVIAZ) 4 MG TB24 tablet Take 1 tablet (4 mg total) by mouth daily. 90 tablet 3  . fluticasone (FLONASE) 50 MCG/ACT nasal spray Place 1 spray into both nostrils daily as needed (for allergies.).     Marland Kitchen guaiFENesin-codeine (CHERATUSSIN AC) 100-10 MG/5ML syrup Take 5 mLs by mouth 3 (three) times daily as needed for cough. 120 mL 0  . ipratropium-albuterol (DUONEB) 0.5-2.5 (3) MG/3ML SOLN Take 3 mLs by nebulization every 6 (six) hours. 360 mL 5  . levothyroxine (SYNTHROID) 25 MCG tablet TAKE 1 TABLET (25 MCG TOTAL) BY MOUTH DAILY BEFORE BREAKFAST. (Patient taking differently: Take 75 mcg by mouth daily before breakfast. ) 30 tablet 1  . lidocaine (XYLOCAINE) 2 % solution   2  . lidocaine-prilocaine (EMLA) cream Apply 1 application topically as needed.    . metoprolol succinate (TOPROL-XL) 25 MG 24 hr tablet Take 25 mg by mouth daily.    . Multiple Vitamin (MULTIVITAMIN WITH MINERALS) TABS tablet Take 1 tablet by mouth daily.     . Multiple Vitamins-Minerals (SUPER VIKAPS) TABS  Take by mouth.    Marland Kitchen MYRBETRIQ 50 MG TB24 tablet Take 50 mg by mouth daily.    . nitroGLYCERIN (NITROSTAT) 0.4 MG SL tablet Place 0.4 mg under the tongue every 5 (five) minutes as needed for chest pain.     Marland Kitchen ondansetron (ZOFRAN) 8 MG tablet Take 8 mg by mouth every 8 (eight) hours as needed for nausea or vomiting.    . OXYGEN Inhale 2 L into the lungs at bedtime.     . pantoprazole (PROTONIX) 40 MG tablet Take 40 mg by mouth daily.  3  . potassium chloride (K-DUR) 10 MEQ tablet TAKE 1 TABLET (10 MEQ TOTAL) BY MOUTH ONCE DAILY.  3  . predniSONE (STERAPRED UNI-PAK 21 TAB) 10 MG (21) TBPK tablet Take as directed. 21 tablet 0  . umeclidinium-vilanterol (ANORO ELLIPTA) 62.5-25 MCG/INH AEPB Inhale 1 puff into the lungs daily. 1 each 10   No current facility-administered medications for this visit.   Facility-Administered Medications Ordered in Other Visits  Medication Dose Route Frequency Provider Last Rate Last Admin  . heparin lock flush 100 unit/mL  500 Units Intravenous Once Grayland Ormond Kathlene November, MD        OBJECTIVE: There were no vitals filed for this visit.   There is no height or weight on file to calculate BMI.    ECOG FS:0 - Asymptomatic  General: Well-developed, well-nourished, no acute distress. HEENT: Normocephalic. Neuro: Alert, answering all questions appropriately. Cranial nerves grossly intact. Psych: Normal affect.   LAB RESULTS:  Lab Results  Component Value Date   NA 134 (L) 10/23/2019   K 3.9 10/23/2019   CL 101 10/23/2019   CO2 25 10/23/2019   GLUCOSE 92 10/23/2019   BUN 7 (L) 10/23/2019   CREATININE 0.79 10/23/2019   CALCIUM 8.9 10/23/2019   PROT 7.0 07/23/2019   ALBUMIN 4.0 07/23/2019   AST 23 07/23/2019   ALT 26 07/23/2019   ALKPHOS 88 07/23/2019   BILITOT 0.5 07/23/2019   GFRNONAA >60 10/23/2019   GFRAA >60 10/23/2019    Lab Results  Component Value Date   WBC 7.9 07/23/2019   NEUTROABS 6.1 07/23/2019   HGB 14.7 07/23/2019   HCT 41.8 07/23/2019  MCV 95.0 07/23/2019   PLT 221 07/23/2019     STUDIES:  ASSESSMENT: Stage IIIa non-small cell lung cancer, right middle lobe.  PLAN:    1. Stage IIIa non-small cell lung cancer, right middle lobe: Case discussed with pathologist and unable to determine whether this is adenocarcinoma or squamous cell carcinoma.  There is also insufficient tissue to do further testing.  Liquid biopsy did not reveal any actionable mutations.  MRI of the brain completed on Mar 28, 2018 reviewed independently did not reveal metastatic disease.  Patient completed XRT June 26, 2018.  He completed his concurrent single agent carboplatinum on June 21, 2018.  Patient had a reaction to Taxol during cycle 1 and this was discontinued.  He completed a year-long of maintenance durvalumab on June 27, 2019.  His most recent CT scan results from October 23, 2019 reviewed independently with no obvious evidence of recurrent or progressive disease.  No intervention is needed at this time.  Return to clinic in 6 months with repeat imaging, laboratory work, and further evaluation.  2.  Secondary polycythemia: Resolved.  Likely secondary to heavy tobacco use.  Previously, the remainder of his laboratory work, including JAK-2 mutation, was either negative or within normal limits.  3.  Right upper lobe pulmonary nodule: Not specifically mentioned on most recent CT scan.  Repeat imaging in 6 months as above.   4.  Colon polyps:  Patient has a personal history of greater then 10 adenomatous polyps on his most recent conoloscopy. He does not know of any family history of increased polyps or colon cancer.  Genetic testing to assess for the APC mutation for FAP or AFAP was negative. Continue colonoscopies as per GI. 5. Tobacco Use: Patient continues to heavily smoke, but stated today he is cutting back.  He expressed understanding by continuing tobacco use increases his chance of recurrence. 6. Anxiety: Chronic. Continue Valium 10 mg  every 12 hours as needed.  Continue treatment and evaluation per primary care. 7.  Hyponatremia: Nearly resolved.  Patient's most recent sodium level is 134. 8.  Hypothyroidism: Appreciate endocrinology input.  Continue Synthroid as prescribed.  Follow-up with endocrinology as scheduled. 9.  CHF: Patient has an EF of 40%.  Continue evaluation and treatment per cardiology. 10.  Weakness and fatigue: Patient does not complain of this today.  I provided 15 minutes of face-to-face video visit time during this encounter which included chart review. Greater than 50% was spent counseling as documented under my assessment & plan.    Patient expressed understanding and was in agreement with this plan. He also understands that He can call clinic at any time with any questions, concerns, or complaints.    [name], MD   10/29/2019 1:04 PM

## 2019-10-24 NOTE — Progress Notes (Signed)
Patient prescreened for appointment. Patient has no concerns or questions.  

## 2019-10-29 ENCOUNTER — Other Ambulatory Visit: Payer: Self-pay

## 2019-10-29 ENCOUNTER — Inpatient Hospital Stay (HOSPITAL_BASED_OUTPATIENT_CLINIC_OR_DEPARTMENT_OTHER): Payer: BC Managed Care – PPO | Admitting: Oncology

## 2019-10-29 DIAGNOSIS — C3491 Malignant neoplasm of unspecified part of right bronchus or lung: Secondary | ICD-10-CM

## 2019-11-12 ENCOUNTER — Inpatient Hospital Stay: Payer: BC Managed Care – PPO

## 2020-01-07 ENCOUNTER — Inpatient Hospital Stay: Payer: BC Managed Care – PPO | Attending: Oncology

## 2020-01-07 ENCOUNTER — Other Ambulatory Visit: Payer: Self-pay

## 2020-01-07 DIAGNOSIS — Z452 Encounter for adjustment and management of vascular access device: Secondary | ICD-10-CM | POA: Diagnosis not present

## 2020-01-07 DIAGNOSIS — Z85118 Personal history of other malignant neoplasm of bronchus and lung: Secondary | ICD-10-CM | POA: Insufficient documentation

## 2020-01-07 DIAGNOSIS — Z95828 Presence of other vascular implants and grafts: Secondary | ICD-10-CM

## 2020-01-07 MED ORDER — HEPARIN SOD (PORK) LOCK FLUSH 100 UNIT/ML IV SOLN
500.0000 [IU] | Freq: Once | INTRAVENOUS | Status: AC
  Administered 2020-01-07: 500 [IU] via INTRAVENOUS
  Filled 2020-01-07: qty 5

## 2020-01-07 MED ORDER — SODIUM CHLORIDE 0.9% FLUSH
10.0000 mL | Freq: Once | INTRAVENOUS | Status: AC
  Administered 2020-01-07: 10 mL via INTRAVENOUS
  Filled 2020-01-07: qty 10

## 2020-03-03 ENCOUNTER — Other Ambulatory Visit: Payer: Self-pay

## 2020-03-03 ENCOUNTER — Inpatient Hospital Stay: Payer: BC Managed Care – PPO | Attending: Oncology

## 2020-03-03 DIAGNOSIS — Z452 Encounter for adjustment and management of vascular access device: Secondary | ICD-10-CM | POA: Diagnosis present

## 2020-03-03 DIAGNOSIS — Z95828 Presence of other vascular implants and grafts: Secondary | ICD-10-CM

## 2020-03-03 DIAGNOSIS — Z85118 Personal history of other malignant neoplasm of bronchus and lung: Secondary | ICD-10-CM | POA: Insufficient documentation

## 2020-03-03 MED ORDER — SODIUM CHLORIDE 0.9% FLUSH
10.0000 mL | INTRAVENOUS | Status: DC | PRN
  Administered 2020-03-03: 10 mL via INTRAVENOUS
  Filled 2020-03-03: qty 10

## 2020-03-03 MED ORDER — HEPARIN SOD (PORK) LOCK FLUSH 100 UNIT/ML IV SOLN
500.0000 [IU] | Freq: Once | INTRAVENOUS | Status: AC
  Administered 2020-03-03: 500 [IU] via INTRAVENOUS
  Filled 2020-03-03: qty 5

## 2020-03-03 MED ORDER — HEPARIN SOD (PORK) LOCK FLUSH 100 UNIT/ML IV SOLN
INTRAVENOUS | Status: AC
  Filled 2020-03-03: qty 5

## 2020-04-24 ENCOUNTER — Ambulatory Visit
Admission: RE | Admit: 2020-04-24 | Discharge: 2020-04-24 | Disposition: A | Discharge status: Home / Self Care | Payer: BC Managed Care – PPO | Source: Ambulatory Visit | Attending: Oncology | Admitting: Oncology

## 2020-04-24 ENCOUNTER — Inpatient Hospital Stay: Payer: BC Managed Care – PPO | Attending: Oncology

## 2020-04-24 ENCOUNTER — Other Ambulatory Visit: Payer: Self-pay

## 2020-04-24 ENCOUNTER — Telehealth: Payer: Self-pay | Admitting: *Deleted

## 2020-04-24 DIAGNOSIS — Z85118 Personal history of other malignant neoplasm of bronchus and lung: Secondary | ICD-10-CM | POA: Diagnosis present

## 2020-04-24 DIAGNOSIS — C3491 Malignant neoplasm of unspecified part of right bronchus or lung: Secondary | ICD-10-CM | POA: Diagnosis present

## 2020-04-24 DIAGNOSIS — Z452 Encounter for adjustment and management of vascular access device: Secondary | ICD-10-CM | POA: Diagnosis not present

## 2020-04-24 DIAGNOSIS — Z95828 Presence of other vascular implants and grafts: Secondary | ICD-10-CM

## 2020-04-24 LAB — CBC WITH DIFFERENTIAL/PLATELET
Abs Immature Granulocytes: 0.01 10*3/uL (ref 0.00–0.07)
Basophils Absolute: 0.1 10*3/uL (ref 0.0–0.1)
Basophils Relative: 1 %
Eosinophils Absolute: 0.2 10*3/uL (ref 0.0–0.5)
Eosinophils Relative: 3 %
HCT: 41.9 % (ref 39.0–52.0)
Hemoglobin: 15.1 g/dL (ref 13.0–17.0)
Immature Granulocytes: 0 %
Lymphocytes Relative: 13 %
Lymphs Abs: 1 10*3/uL (ref 0.7–4.0)
MCH: 34.4 pg — ABNORMAL HIGH (ref 26.0–34.0)
MCHC: 36 g/dL (ref 30.0–36.0)
MCV: 95.4 fL (ref 80.0–100.0)
Monocytes Absolute: 0.6 10*3/uL (ref 0.1–1.0)
Monocytes Relative: 8 %
Neutro Abs: 5.8 10*3/uL (ref 1.7–7.7)
Neutrophils Relative %: 75 %
Platelets: 239 10*3/uL (ref 150–400)
RBC: 4.39 MIL/uL (ref 4.22–5.81)
RDW: 11.9 % (ref 11.5–15.5)
WBC: 7.6 10*3/uL (ref 4.0–10.5)
nRBC: 0 % (ref 0.0–0.2)

## 2020-04-24 LAB — COMPREHENSIVE METABOLIC PANEL
ALT: 24 U/L (ref 0–44)
AST: 29 U/L (ref 15–41)
Albumin: 4.1 g/dL (ref 3.5–5.0)
Alkaline Phosphatase: 85 U/L (ref 38–126)
Anion gap: 10 (ref 5–15)
BUN: 8 mg/dL (ref 8–23)
CO2: 25 mmol/L (ref 22–32)
Calcium: 8.8 mg/dL — ABNORMAL LOW (ref 8.9–10.3)
Chloride: 97 mmol/L — ABNORMAL LOW (ref 98–111)
Creatinine, Ser: 0.79 mg/dL (ref 0.61–1.24)
GFR calc Af Amer: >60 mL/min (ref >60)
GFR calc non Af Amer: >60 mL/min (ref >60)
Glucose, Bld: 109 mg/dL — ABNORMAL HIGH (ref 70–99)
Potassium: 4 mmol/L (ref 3.5–5.1)
Sodium: 132 mmol/L — ABNORMAL LOW (ref 135–145)
Total Bilirubin: 0.7 mg/dL (ref 0.3–1.2)
Total Protein: 6.9 g/dL (ref 6.5–8.1)

## 2020-04-24 MED ORDER — IOHEXOL 300 MG/ML  SOLN
75.0000 mL | Freq: Once | INTRAMUSCULAR | Status: AC | PRN
  Administered 2020-04-24: 75 mL via INTRAVENOUS

## 2020-04-24 MED ORDER — HEPARIN SOD (PORK) LOCK FLUSH 100 UNIT/ML IV SOLN
500.0000 [IU] | Freq: Once | INTRAVENOUS | Status: AC
  Administered 2020-04-24: 500 [IU] via INTRAVENOUS
  Filled 2020-04-24: qty 5

## 2020-04-24 MED ORDER — SODIUM CHLORIDE 0.9% FLUSH
10.0000 mL | INTRAVENOUS | Status: DC | PRN
  Administered 2020-04-24: 10 mL via INTRAVENOUS
  Filled 2020-04-24: qty 10

## 2020-04-24 NOTE — Progress Notes (Deleted)
[place]  [phone] [phone]) [phone]  ID: Paul Carpenter OB: 1956-04-09  MR#: [id number]  FFM#:[id number]  Patient Care Team: Kirk Ruths, MD as PCP - General (Internal Medicine) [name], MD as Referring Physician (Vascular Surgery) [name], MD as Consulting Physician (Oncology) [name], MD as Referring Physician (Radiation Oncology)  I connected with Paul Carpenter on 04/24/20 at  2:15 PM EDT by video enabled telemedicine visit and verified that I am speaking with the correct person using two identifiers.   I discussed the limitations, risks, security and privacy concerns of performing an evaluation and management service by telemedicine and the availability of in-person appointments. I also discussed with the patient that there may be a patient responsible charge related to this service. The patient expressed understanding and agreed to proceed.   Other persons participating in the visit and their role in the encounter: Patient, MD.  Patient's location: Home. Provider's location: Clinic.  CHIEF COMPLAINT: Stage IIIa non-small cell lung cancer, right middle lobe.  INTERVAL HISTORY: Patient agreed to video enabled telemedicine visit for further evaluation and discussion of his imaging results.  He currently feels well and is asymptomatic.  He does not complain of weakness or fatigue today.  He continues to have a chronic cough. He has no neurologic complaints.  He denies any recent fevers or illnesses.  He does not complain of dysphasia today.  He denies any chest pain, shortness of breath, or hemoptysis. He denies any nausea, vomiting, constipation, or diarrhea. He has no urinary complaints.  Patient offers no further specific complaints today.  REVIEW OF SYSTEMS:   Review of Systems  Constitutional: Positive for malaise/fatigue. Negative for fever and weight loss.  HENT: Negative.  Negative for congestion and  sore throat.   Respiratory: Positive for cough. Negative for hemoptysis and shortness of breath.   Cardiovascular: Negative.  Negative for chest pain and leg swelling.  Gastrointestinal: Negative.  Negative for abdominal pain, blood in stool and melena.  Genitourinary: Negative.  Negative for dysuria.  Musculoskeletal: Negative.  Negative for joint pain.  Skin: Negative.  Negative for rash.  Neurological: Positive for weakness. Negative for dizziness, sensory change, focal weakness and headaches.  Psychiatric/Behavioral: Negative.  The patient is not nervous/anxious.     As per HPI. Otherwise, a complete review of systems is negative.  PAST MEDICAL HISTORY: Past Medical History:  Diagnosis Date  . Anginal pain ([place])   . Anxiety   . Chest pain   . CHF (congestive heart failure) ([place])   . Chicken pox   . Complication of anesthesia    o2 dropped after neck fusion  . COPD (chronic obstructive pulmonary disease) ([place])   . Coronary artery disease   . Cough    chronic  clear phlegm  . Dysrhythmia    palpitations  . GERD (gastroesophageal reflux disease)    h/o reflux/ hoarsness  . Hematochezia   . Hemorrhoids   . History of chickenpox   . History of colon polyps   . History of Helicobacter pylori infection   . Hoarseness   . Hypertension   . Lung cancer ([place]) 05/2016   Chemo + rad tx's.   . Migraines   . OSA (obstructive sleep apnea)    has CPAP but does not use  . Personal history of tobacco use, presenting hazards to health 03/05/2016  . Pneumonia    5/17  . Raynaud disease   . Raynaud disease   .  Raynaud's disease   . Rotator cuff tear    on right  . Shortness of breath dyspnea   . Sleep apnea   . Ulcer (traumatic) of oral mucosa     PAST SURGICAL HISTORY: Past Surgical History:  Procedure Laterality Date  . BACK SURGERY     cervical fusion x 2  . CARDIAC CATHETERIZATION    . CERVICAL DISCECTOMY     x 2  . COLONOSCOPY    . COLONOSCOPY N/A 07/25/2015    Procedure: COLONOSCOPY;  Surgeon: Lollie Sails, MD;  Location: [facility] ENDOSCOPY;  Service: Endoscopy;  Laterality: N/A;  . COLONOSCOPY WITH PROPOFOL N/A 10/04/2017   Procedure: COLONOSCOPY WITH PROPOFOL;  Surgeon: Lollie Sails, MD;  Location: [facility] ENDOSCOPY;  Service: Endoscopy;  Laterality: N/A;  . ELECTROMAGNETIC NAVIGATION BROCHOSCOPY Left 06/28/2016   Procedure: ELECTROMAGNETIC NAVIGATION BRONCHOSCOPY;  Surgeon: Vilinda Boehringer, MD;  Location: [facility] ORS;  Service: Cardiopulmonary;  Laterality: Left;  . ENDOBRONCHIAL ULTRASOUND N/A 04/11/2018   Procedure: ENDOBRONCHIAL ULTRASOUND;  Surgeon: [name], MD;  Location: [facility] ORS;  Service: Cardiopulmonary;  Laterality: N/A;  . ESOPHAGOGASTRODUODENOSCOPY N/A 07/25/2015   Procedure: ESOPHAGOGASTRODUODENOSCOPY (EGD);  Surgeon: Lollie Sails, MD;  Location: [facility] ENDOSCOPY;  Service: Endoscopy;  Laterality: N/A;  . NASAL SINUS SURGERY     x 2   . PORTA CATH INSERTION N/A 04/24/2018   Procedure: PORTA CATH INSERTION;  Surgeon: Algernon Huxley, MD;  Location: [place] CV LAB;  Service: Cardiovascular;  Laterality: N/A;  . rotator cuff surgery Right    07/2016  . SEPTOPLASTY    . SKIN GRAFT      FAMILY HISTORY Family History  Problem Relation Age of Onset  . Heart disease Father   . Prostate cancer Father   . Heart disease Paternal Grandmother   . Heart attack Maternal Grandfather 52  . Kidney cancer Neg Hx   . Bladder Cancer Neg Hx   . Other Neg Hx        pituitary abnormality       ADVANCED DIRECTIVES:    HEALTH MAINTENANCE: Social History   Tobacco Use  . Smoking status: Current Every Day Smoker    Packs/day: 1.50    Years: 45.00    Pack years: 67.50    Types: Cigarettes  . Smokeless tobacco: Never Used  Vaping Use  . Vaping Use: Never used  Substance Use Topics  . Alcohol use: Yes    Alcohol/week: 2.0 standard drinks    Types: 2 Standard drinks or equivalent per week    Comment: moderate  . Drug use: No      Allergies  Allergen Reactions  . Lisinopril Rash  . Varenicline Rash    Current Outpatient Medications  Medication Sig Dispense Refill  . acetaminophen (TYLENOL) 500 MG tablet Take 1,000 mg by mouth every 6 (six) hours as needed (for pain.).    Marland Kitchen albuterol (PROAIR HFA) 108 (90 Base) MCG/ACT inhaler Inhale 2 puffs into the lungs every 4 (four) hours as needed for wheezing or shortness of breath. 1 Inhaler 1  . atorvastatin (LIPITOR) 40 MG tablet Take 40 mg by mouth at bedtime.     Marland Kitchen azithromycin (ZITHROMAX Z-PAK) 250 MG tablet Take two tablets on day one and one tablet days 2-5. 6 each 0  . busPIRone (BUSPAR) 15 MG tablet Take 15 mg by mouth 2 (two) times daily.   10  . diazepam (VALIUM) 5 MG tablet Take 2 tablets (10 mg total) by mouth every  12 (twelve) hours as needed for anxiety. 60 tablet 0  . DULoxetine (CYMBALTA) 60 MG capsule Take 1 capsule by mouth 1 day or 1 dose.    . fesoterodine (TOVIAZ) 4 MG TB24 tablet Take 1 tablet (4 mg total) by mouth daily. 90 tablet 3  . fluticasone (FLONASE) 50 MCG/ACT nasal spray Place 1 spray into both nostrils daily as needed (for allergies.).     Marland Kitchen guaiFENesin-codeine (CHERATUSSIN AC) 100-10 MG/5ML syrup Take 5 mLs by mouth 3 (three) times daily as needed for cough. 120 mL 0  . ipratropium-albuterol (DUONEB) 0.5-2.5 (3) MG/3ML SOLN Take 3 mLs by nebulization every 6 (six) hours. 360 mL 5  . levothyroxine (SYNTHROID) 25 MCG tablet TAKE 1 TABLET (25 MCG TOTAL) BY MOUTH DAILY BEFORE BREAKFAST. (Patient taking differently: Take 75 mcg by mouth daily before breakfast. ) 30 tablet 1  . lidocaine (XYLOCAINE) 2 % solution   2  . lidocaine-prilocaine (EMLA) cream Apply 1 application topically as needed.    . metoprolol succinate (TOPROL-XL) 25 MG 24 hr tablet Take 25 mg by mouth daily.    . Multiple Vitamin (MULTIVITAMIN WITH MINERALS) TABS tablet Take 1 tablet by mouth daily.     . Multiple Vitamins-Minerals (SUPER VIKAPS) TABS Take by mouth.    Marland Kitchen  MYRBETRIQ 50 MG TB24 tablet Take 50 mg by mouth daily.    . nitroGLYCERIN (NITROSTAT) 0.4 MG SL tablet Place 0.4 mg under the tongue every 5 (five) minutes as needed for chest pain.     Marland Kitchen ondansetron (ZOFRAN) 8 MG tablet Take 8 mg by mouth every 8 (eight) hours as needed for nausea or vomiting.    . OXYGEN Inhale 2 L into the lungs at bedtime.     . pantoprazole (PROTONIX) 40 MG tablet Take 40 mg by mouth daily.  3  . potassium chloride (K-DUR) 10 MEQ tablet TAKE 1 TABLET (10 MEQ TOTAL) BY MOUTH ONCE DAILY.  3  . predniSONE (STERAPRED UNI-PAK 21 TAB) 10 MG (21) TBPK tablet Take as directed. 21 tablet 0  . propranolol (INNOPRAN XL) 80 MG 24 hr capsule Take by mouth.    . umeclidinium-vilanterol (ANORO ELLIPTA) 62.5-25 MCG/INH AEPB Inhale 1 puff into the lungs daily. 1 each 10   No current facility-administered medications for this visit.   Facility-Administered Medications Ordered in Other Visits  Medication Dose Route Frequency Provider Last Rate Last Admin  . heparin lock flush 100 unit/mL  500 Units Intravenous Once Grayland Ormond Kathlene November, MD        OBJECTIVE: There were no vitals filed for this visit.   There is no height or weight on file to calculate BMI.    ECOG FS:0 - Asymptomatic  General: Well-developed, well-nourished, no acute distress. HEENT: Normocephalic. Neuro: Alert, answering all questions appropriately. Cranial nerves grossly intact. Psych: Normal affect.   LAB RESULTS:  Lab Results  Component Value Date   NA 134 (L) 10/23/2019   K 3.9 10/23/2019   CL 101 10/23/2019   CO2 25 10/23/2019   GLUCOSE 92 10/23/2019   BUN 7 (L) 10/23/2019   CREATININE 0.79 10/23/2019   CALCIUM 8.9 10/23/2019   PROT 7.0 07/23/2019   ALBUMIN 4.0 07/23/2019   AST 23 07/23/2019   ALT 26 07/23/2019   ALKPHOS 88 07/23/2019   BILITOT 0.5 07/23/2019   GFRNONAA >60 10/23/2019   GFRAA >60 10/23/2019    Lab Results  Component Value Date   WBC 7.9 07/23/2019   NEUTROABS 6.1 07/23/2019  HGB 14.7 07/23/2019   HCT 41.8 07/23/2019   MCV 95.0 07/23/2019   PLT 221 07/23/2019     STUDIES:  ASSESSMENT: Stage IIIa non-small cell lung cancer, right middle lobe.  PLAN:    1. Stage IIIa non-small cell lung cancer, right middle lobe: Case discussed with pathologist and unable to determine whether this is adenocarcinoma or squamous cell carcinoma.  There is also insufficient tissue to do further testing.  Liquid biopsy did not reveal any actionable mutations.  MRI of the brain completed on Mar 28, 2018 reviewed independently did not reveal metastatic disease.  Patient completed XRT June 26, 2018.  He completed his concurrent single agent carboplatinum on June 21, 2018.  Patient had a reaction to Taxol during cycle 1 and this was discontinued.  He completed a year-long of maintenance durvalumab on June 27, 2019.  His most recent CT scan results from October 23, 2019 reviewed independently with no obvious evidence of recurrent or progressive disease.  No intervention is needed at this time.  Return to clinic in 6 months with repeat imaging, laboratory work, and further evaluation.  2.  Secondary polycythemia: Resolved.  Likely secondary to heavy tobacco use.  Previously, the remainder of his laboratory work, including JAK-2 mutation, was either negative or within normal limits.  3.  Right upper lobe pulmonary nodule: Not specifically mentioned on most recent CT scan.  Repeat imaging in 6 months as above.   4.  Colon polyps:  Patient has a personal history of greater then 10 adenomatous polyps on his most recent conoloscopy. He does not know of any family history of increased polyps or colon cancer.  Genetic testing to assess for the APC mutation for FAP or AFAP was negative. Continue colonoscopies as per GI. 5. Tobacco Use: Patient continues to heavily smoke, but stated today he is cutting back.  He expressed understanding by continuing tobacco use increases his chance of  recurrence. 6. Anxiety: Chronic. Continue Valium 10 mg every 12 hours as needed.  Continue treatment and evaluation per primary care. 7.  Hyponatremia: Nearly resolved.  Patient's most recent sodium level is 134. 8.  Hypothyroidism: Appreciate endocrinology input.  Continue Synthroid as prescribed.  Follow-up with endocrinology as scheduled. 9.  CHF: Patient has an EF of 40%.  Continue evaluation and treatment per cardiology. 10.  Weakness and fatigue: Patient does not complain of this today.  I provided 15 minutes of face-to-face video visit time during this encounter which included chart review. Greater than 50% was spent counseling as documented under my assessment & plan.    Patient expressed understanding and was in agreement with this plan. He also understands that He can call clinic at any time with any questions, concerns, or complaints.    [name], MD   04/24/2020 7:14 AM

## 2020-04-24 NOTE — Telephone Encounter (Signed)
Pt left message that he saw his CT results on MyChart this afternoon and would like to know if he needed to be scheduled for a PET scan. Discussed with Dr. Grayland Ormond and he recommended for pt to be added for discussion at tumor board on 7/1 and to move his appt on Mon 6/28 to after tumor board discussion. Appts will be adjusted and pt will be notified with new appt details.

## 2020-04-26 NOTE — Progress Notes (Signed)
[place]  [phone] [phone]  ID: Paul Carpenter OB: Feb 07, 1956  MR#: [id number]  WPY#:[id number]  Patient Care Team: Kirk Ruths, MD as PCP - General (Internal Medicine) [name], MD as Referring Physician (Vascular Surgery) [name], MD as Consulting Physician (Oncology) [name], MD as Referring Physician (Radiation Oncology)  I connected with Paul Carpenter on 05/03/20 at 10:15 AM EDT by video enabled telemedicine visit and verified that I am speaking with the correct person using two identifiers.   I discussed the limitations, risks, security and privacy concerns of performing an evaluation and management service by telemedicine and the availability of in-person appointments. I also discussed with the patient that there may be a patient responsible charge related to this service. The patient expressed understanding and agreed to proceed.   Other persons participating in the visit and their role in the encounter: Patient, MD.  Patient's location: Home. Provider's location: Clinic.  CHIEF COMPLAINT: Stage IIIa non-small cell lung cancer, right middle lobe.  INTERVAL HISTORY: Patient agreed to video enabled telemedicine visit for further evaluation and discussion of his imaging results.  He currently feels well and is asymptomatic. He continues to have a chronic cough. He has no neurologic complaints.  He denies any recent fevers or illnesses.  He does not complain of dysphasia today.  He denies any chest pain, shortness of breath, or hemoptysis. He denies any nausea, vomiting, constipation, or diarrhea. He has no urinary complaints.  Patient offers no further specific complaints today.  REVIEW OF SYSTEMS:   Review of Systems  Constitutional: Negative.  Negative for fever, malaise/fatigue and weight loss.  HENT: Negative.  Negative for congestion and sore throat.   Respiratory: Positive for cough. Negative  for hemoptysis and shortness of breath.   Cardiovascular: Negative.  Negative for chest pain and leg swelling.  Gastrointestinal: Negative.  Negative for abdominal pain, blood in stool and melena.  Genitourinary: Negative.  Negative for dysuria.  Musculoskeletal: Negative.  Negative for joint pain.  Skin: Negative.  Negative for rash.  Neurological: Negative.  Negative for dizziness, sensory change, focal weakness, weakness and headaches.  Psychiatric/Behavioral: Negative.  The patient is not nervous/anxious.     As per HPI. Otherwise, a complete review of systems is negative.  PAST MEDICAL HISTORY: Past Medical History:  Diagnosis Date  . Anginal pain ([place])   . Anxiety   . Chest pain   . CHF (congestive heart failure) ([place])   . Chicken pox   . Complication of anesthesia    o2 dropped after neck fusion  . COPD (chronic obstructive pulmonary disease) ([place])   . Coronary artery disease   . Cough    chronic  clear phlegm  . Dysrhythmia    palpitations  . GERD (gastroesophageal reflux disease)    h/o reflux/ hoarsness  . Hematochezia   . Hemorrhoids   . History of chickenpox   . History of colon polyps   . History of Helicobacter pylori infection   . Hoarseness   . Hypertension   . Lung cancer ([place]) 05/2016   Chemo + rad tx's.   . Migraines   . OSA (obstructive sleep apnea)    has CPAP but does not use  . Personal history of tobacco use, presenting hazards to health 03/05/2016  . Pneumonia    5/17  . Raynaud disease   . Raynaud disease   . Raynaud's disease   . Rotator cuff tear  on right  . Shortness of breath dyspnea   . Sleep apnea   . Ulcer (traumatic) of oral mucosa     PAST SURGICAL HISTORY: Past Surgical History:  Procedure Laterality Date  . BACK SURGERY     cervical fusion x 2  . CARDIAC CATHETERIZATION    . CERVICAL DISCECTOMY     x 2  . COLONOSCOPY    . COLONOSCOPY N/A 07/25/2015   Procedure: COLONOSCOPY;  Surgeon: Lollie Sails, MD;   Location: [facility] ENDOSCOPY;  Service: Endoscopy;  Laterality: N/A;  . COLONOSCOPY WITH PROPOFOL N/A 10/04/2017   Procedure: COLONOSCOPY WITH PROPOFOL;  Surgeon: Lollie Sails, MD;  Location: [facility] ENDOSCOPY;  Service: Endoscopy;  Laterality: N/A;  . ELECTROMAGNETIC NAVIGATION BROCHOSCOPY Left 06/28/2016   Procedure: ELECTROMAGNETIC NAVIGATION BRONCHOSCOPY;  Surgeon: Vilinda Boehringer, MD;  Location: [facility] ORS;  Service: Cardiopulmonary;  Laterality: Left;  . ENDOBRONCHIAL ULTRASOUND N/A 04/11/2018   Procedure: ENDOBRONCHIAL ULTRASOUND;  Surgeon: [name], MD;  Location: [facility] ORS;  Service: Cardiopulmonary;  Laterality: N/A;  . ESOPHAGOGASTRODUODENOSCOPY N/A 07/25/2015   Procedure: ESOPHAGOGASTRODUODENOSCOPY (EGD);  Surgeon: Lollie Sails, MD;  Location: [facility] ENDOSCOPY;  Service: Endoscopy;  Laterality: N/A;  . NASAL SINUS SURGERY     x 2   . PORTA CATH INSERTION N/A 04/24/2018   Procedure: PORTA CATH INSERTION;  Surgeon: Algernon Huxley, MD;  Location: [facility];  Service: Cardiovascular;  Laterality: N/A;  . rotator cuff surgery Right    07/2016  . SEPTOPLASTY    . SKIN GRAFT      FAMILY HISTORY Family History  Problem Relation Age of Onset  . Heart disease Father   . Prostate cancer Father   . Heart disease Paternal Grandmother   . Heart attack Maternal Grandfather 52  . Kidney cancer Neg Hx   . Bladder Cancer Neg Hx   . Other Neg Hx        pituitary abnormality       ADVANCED DIRECTIVES:    HEALTH MAINTENANCE: Social History   Tobacco Use  . Smoking status: Current Every Day Smoker    Packs/day: 1.50    Years: 45.00    Pack years: 67.50    Types: Cigarettes  . Smokeless tobacco: Never Used  Vaping Use  . Vaping Use: Never used  Substance Use Topics  . Alcohol use: Yes    Alcohol/week: 2.0 standard drinks    Types: 2 Standard drinks or equivalent per week    Comment: moderate  . Drug use: No     Allergies  Allergen Reactions  . Lisinopril Rash  .  Varenicline Rash    Current Outpatient Medications  Medication Sig Dispense Refill  . acetaminophen (TYLENOL) 500 MG tablet Take 1,000 mg by mouth every 6 (six) hours as needed (for pain.).    Marland Kitchen albuterol (PROAIR HFA) 108 (90 Base) MCG/ACT inhaler Inhale 2 puffs into the lungs every 4 (four) hours as needed for wheezing or shortness of breath. 1 Inhaler 1  . atorvastatin (LIPITOR) 40 MG tablet Take 40 mg by mouth at bedtime.     Marland Kitchen azithromycin (ZITHROMAX Z-PAK) 250 MG tablet Take two tablets on day one and one tablet days 2-5. 6 each 0  . busPIRone (BUSPAR) 15 MG tablet Take 15 mg by mouth 2 (two) times daily.   10  . diazepam (VALIUM) 5 MG tablet Take 2 tablets (10 mg total) by mouth every 12 (twelve) hours as needed for anxiety. 60 tablet 0  .  DULoxetine (CYMBALTA) 60 MG capsule Take 1 capsule by mouth 1 day or 1 dose.    . fesoterodine (TOVIAZ) 4 MG TB24 tablet Take 1 tablet (4 mg total) by mouth daily. 90 tablet 3  . fluticasone (FLONASE) 50 MCG/ACT nasal spray Place 1 spray into both nostrils daily as needed (for allergies.).     Marland Kitchen guaiFENesin-codeine (CHERATUSSIN AC) 100-10 MG/5ML syrup Take 5 mLs by mouth 3 (three) times daily as needed for cough. 120 mL 0  . ipratropium-albuterol (DUONEB) 0.5-2.5 (3) MG/3ML SOLN Take 3 mLs by nebulization every 6 (six) hours. 360 mL 5  . levothyroxine (SYNTHROID) 25 MCG tablet TAKE 1 TABLET (25 MCG TOTAL) BY MOUTH DAILY BEFORE BREAKFAST. (Patient taking differently: Take 75 mcg by mouth daily before breakfast. ) 30 tablet 1  . lidocaine (XYLOCAINE) 2 % solution   2  . lidocaine-prilocaine (EMLA) cream Apply 1 application topically as needed.    . metoprolol succinate (TOPROL-XL) 25 MG 24 hr tablet Take 25 mg by mouth daily.    . Multiple Vitamin (MULTIVITAMIN WITH MINERALS) TABS tablet Take 1 tablet by mouth daily.     . Multiple Vitamins-Minerals (SUPER VIKAPS) TABS Take by mouth.    Marland Kitchen MYRBETRIQ 50 MG TB24 tablet Take 50 mg by mouth daily.    .  ondansetron (ZOFRAN) 8 MG tablet Take 8 mg by mouth every 8 (eight) hours as needed for nausea or vomiting.    . OXYGEN Inhale 2 L into the lungs at bedtime.     . pantoprazole (PROTONIX) 40 MG tablet Take 40 mg by mouth daily.  3  . potassium chloride (K-DUR) 10 MEQ tablet TAKE 1 TABLET (10 MEQ TOTAL) BY MOUTH ONCE DAILY.  3  . predniSONE (STERAPRED UNI-PAK 21 TAB) 10 MG (21) TBPK tablet Take as directed. 21 tablet 0  . propranolol (INNOPRAN XL) 80 MG 24 hr capsule Take by mouth.    . umeclidinium-vilanterol (ANORO ELLIPTA) 62.5-25 MCG/INH AEPB Inhale 1 puff into the lungs daily. 1 each 10  . nitroGLYCERIN (NITROSTAT) 0.4 MG SL tablet Place 0.4 mg under the tongue every 5 (five) minutes as needed for chest pain.      No current facility-administered medications for this visit.   Facility-Administered Medications Ordered in Other Visits  Medication Dose Route Frequency Provider Last Rate Last Admin  . heparin lock flush 100 unit/mL  500 Units Intravenous Once Grayland Ormond Kathlene November, MD        OBJECTIVE: There were no vitals filed for this visit.   There is no height or weight on file to calculate BMI.    ECOG FS:0 - Asymptomatic  General: Well-developed, well-nourished, no acute distress. HEENT: Normocephalic. Neuro: Alert, answering all questions appropriately. Cranial nerves grossly intact. Psych: Normal affect.   LAB RESULTS:  Lab Results  Component Value Date   NA 132 (L) 04/24/2020   K 4.0 04/24/2020   CL 97 (L) 04/24/2020   CO2 25 04/24/2020   GLUCOSE 109 (H) 04/24/2020   BUN 8 04/24/2020   CREATININE 0.79 04/24/2020   CALCIUM 8.8 (L) 04/24/2020   PROT 6.9 04/24/2020   ALBUMIN 4.1 04/24/2020   AST 29 04/24/2020   ALT 24 04/24/2020   ALKPHOS 85 04/24/2020   BILITOT 0.7 04/24/2020   GFRNONAA >60 04/24/2020   GFRAA >60 04/24/2020    Lab Results  Component Value Date   WBC 7.6 04/24/2020   NEUTROABS 5.8 04/24/2020   HGB 15.1 04/24/2020   HCT 41.9 04/24/2020  MCV  95.4 04/24/2020   PLT 239 04/24/2020     STUDIES:  ONCOLOGY HISTORY: Case discussed with pathologist and unable to determine whether this is adenocarcinoma or squamous cell carcinoma.  There is also insufficient tissue to do further testing.  Liquid biopsy did not reveal any actionable mutations.  MRI of the brain completed on Mar 28, 2018 reviewed independently did not reveal metastatic disease.  Patient completed XRT June 26, 2018.  He completed his concurrent single agent carboplatinum on June 21, 2018.  Patient had a reaction to Taxol during cycle 1 and this was discontinued.  He completed a year-long of maintenance durvalumab on June 27, 2019.   ASSESSMENT: Stage IIIa non-small cell lung cancer, right middle lobe.  PLAN:    1. Stage IIIa non-small cell lung cancer, right middle lobe: CT scan results from April 24, 2020 reviewed independently and report as above with slight increase in size of right upper lobe nodularity.  Case was discussed at tumor board and recommendation was PET scan for further evaluation.  If PET scan is positive, will consider repeat biopsy to confirm recurrence.  If PET scan is negative we will continue routine imaging for evaluation.  Return to clinic 1 to 2 days after imaging with video assisted telemedicine visit.   2.  Secondary polycythemia: Resolved.  Likely secondary to heavy tobacco use.  Previously, the remainder of his laboratory work, including JAK-2 mutation, was either negative or within normal limits.  3.  Pulmonary nodule: Patient noted to have 2 left upper lobe lung nodules measuring 0.5 cm and 0.69 cm that are both stable and unchanged.  Monitor. 4.  Colon polyps:  Patient has a personal history of greater then 10 adenomatous polyps on his most recent conoloscopy. He does not know of any family history of increased polyps or colon cancer.  Genetic testing to assess for the APC mutation for FAP or AFAP was negative. Continue colonoscopies as per  GI. 5. Tobacco Use: Patient continues to heavily smoke.  He expressed understanding by continuing tobacco use increases his chance of recurrence. 6. Anxiety: Chronic. Continue Valium 10 mg every 12 hours as needed.  Continue treatment and evaluation per primary care. 7.  Hyponatremia: Chronic and unchanged.  Patient's most recent sodium levels are 132. 8.  Hypothyroidism: Appreciate endocrinology input.  Continue Synthroid as prescribed.  Follow-up with endocrinology as scheduled. 9.  CHF: Patient has an EF of 40%.  Continue evaluation and treatment per cardiology. 10.  Weakness and fatigue: Patient does not complain of this today.  I provided 20 minutes of face-to-face video visit time during this encounter which included chart review, counseling, and coordination of care as documented above.    Patient expressed understanding and was in agreement with this plan. He also understands that He can call clinic at any time with any questions, concerns, or complaints.    [name], MD   05/03/2020 7:40 AM

## 2020-04-28 ENCOUNTER — Inpatient Hospital Stay: Payer: BC Managed Care – PPO

## 2020-04-28 ENCOUNTER — Inpatient Hospital Stay: Payer: BC Managed Care – PPO | Admitting: Oncology

## 2020-05-01 ENCOUNTER — Other Ambulatory Visit: Payer: Self-pay | Admitting: Emergency Medicine

## 2020-05-01 ENCOUNTER — Other Ambulatory Visit: Payer: BC Managed Care – PPO

## 2020-05-01 DIAGNOSIS — C3491 Malignant neoplasm of unspecified part of right bronchus or lung: Secondary | ICD-10-CM

## 2020-05-01 NOTE — Progress Notes (Signed)
Tumor Board Documentation  TRAESON DUSZA was presented by Dr Grayland Ormond at our Tumor Board on 05/01/2020, which included representatives from medical oncology, radiation oncology, internal medicine, navigation, pathology, radiology, surgical, genetics, research, palliative care.  Bogdan currently presents as a current patient, for discussion with history of the following treatments: active survellience, neoadjuvant chemoradiation.  Additionally, we reviewed previous medical and familial history, history of present illness, and recent lab results along with all available histopathologic and imaging studies. The tumor board considered available treatment options and made the following recommendations: Additional screening Possible biopsy vs additional Radiation therapy  The following procedures/referrals were also placed: No orders of the defined types were placed in this encounter.   Clinical Trial Status: not discussed   Staging used: AJCC Stage Group  AJCC Staging:       Group: History of Non Small Cell Carcinoma of Lung in 2019   National site-specific guidelines   were discussed with respect to the case.  Tumor board is a meeting of clinicians from various specialty areas who evaluate and discuss patients for whom a multidisciplinary approach is being considered. Final determinations in the plan of care are those of the provider(s). The responsibility for follow up of recommendations given during tumor board is that of the provider.   Today's extended care, comprehensive team conference, [place] was not present for the discussion and was not examined.   Multidisciplinary Tumor Board is a multidisciplinary case peer review process.  Decisions discussed in the Multidisciplinary Tumor Board reflect the opinions of the specialists present at the conference without having examined the patient.  Ultimately, treatment and diagnostic decisions rest with the primary provider(s) and the  patient.

## 2020-05-02 ENCOUNTER — Other Ambulatory Visit: Payer: Self-pay

## 2020-05-02 ENCOUNTER — Inpatient Hospital Stay: Payer: BC Managed Care – PPO | Attending: Oncology | Admitting: Oncology

## 2020-05-02 ENCOUNTER — Encounter: Payer: Self-pay | Admitting: Oncology

## 2020-05-02 DIAGNOSIS — C3491 Malignant neoplasm of unspecified part of right bronchus or lung: Secondary | ICD-10-CM

## 2020-05-02 NOTE — Progress Notes (Signed)
Pt reports weakness, fatigue, unintentional weightloss. Endorses some dizziness.

## 2020-05-17 NOTE — Progress Notes (Signed)
[name]  Telephone:([phone]) [phone] Fax:(336) [phone]  ID: Paul Carpenter OB: 02-22-1956  MR#: [id number]  DVV#:[id number]  Patient Care Team: Kirk Ruths, MD as PCP - General (Internal Medicine) [name], MD as Referring Physician (Vascular Surgery) [name], MD as Consulting Physician (Oncology) [name], MD as Referring Physician (Radiation Oncology)  I connected with Paul Carpenter on 05/23/20 at  3:30 PM EDT by video enabled telemedicine visit and verified that I am speaking with the correct person using two identifiers.   I discussed the limitations, risks, security and privacy concerns of performing an evaluation and management service by telemedicine and the availability of in-person appointments. I also discussed with the patient that there may be a patient responsible charge related to this service. The patient expressed understanding and agreed to proceed.   Other persons participating in the visit and their role in the encounter: Patient, MD.  Patient's location: Home. Provider's location: Clinic.  CHIEF COMPLAINT: Stage IIIa non-small cell lung cancer, right middle lobe.  INTERVAL HISTORY: Patient agreed to video enabled telemedicine visit for further evaluation and discussion of his PET scan results.  He is anxious, but otherwise feels well.  He continues to have chronic cough.  He continues to smoke.  He has no neurologic complaints.  He denies any recent fevers or illnesses.  He denies any chest pain, shortness of breath, or hemoptysis. He denies any nausea, vomiting, constipation, or diarrhea. He has no urinary complaints.  Patient offers no further specific complaints today.  REVIEW OF SYSTEMS:   Review of Systems  Constitutional: Negative.  Negative for fever, malaise/fatigue and weight loss.  HENT: Negative.  Negative for congestion and sore throat.   Respiratory: Positive for cough. Negative for hemoptysis and  shortness of breath.   Cardiovascular: Negative.  Negative for chest pain and leg swelling.  Gastrointestinal: Negative.  Negative for abdominal pain, blood in stool and melena.  Genitourinary: Negative.  Negative for dysuria.  Musculoskeletal: Negative.  Negative for joint pain.  Skin: Negative.  Negative for rash.  Neurological: Negative.  Negative for dizziness, sensory change, focal weakness, weakness and headaches.  Psychiatric/Behavioral: The patient is nervous/anxious.     As per HPI. Otherwise, a complete review of systems is negative.  PAST MEDICAL HISTORY: Past Medical History:  Diagnosis Date  . Anginal pain ([place])   . Anxiety   . Chest pain   . CHF (congestive heart failure) ([place])   . Chicken pox   . Complication of anesthesia    o2 dropped after neck fusion  . COPD (chronic obstructive pulmonary disease) ([place])   . Coronary artery disease   . Cough    chronic  clear phlegm  . Dysrhythmia    palpitations  . GERD (gastroesophageal reflux disease)    h/o reflux/ hoarsness  . Hematochezia   . Hemorrhoids   . History of chickenpox   . History of colon polyps   . History of Helicobacter pylori infection   . Hoarseness   . Hypertension   . Lung cancer ([place]) 05/2016   Chemo + rad tx's.   . Migraines   . OSA (obstructive sleep apnea)    has CPAP but does not use  . Personal history of tobacco use, presenting hazards to health 03/05/2016  . Pneumonia    5/17  . Raynaud disease   . Raynaud disease   . Raynaud's disease   . Rotator cuff tear    on right  .  Shortness of breath dyspnea   . Sleep apnea   . Ulcer (traumatic) of oral mucosa     PAST SURGICAL HISTORY: Past Surgical History:  Procedure Laterality Date  . BACK SURGERY     cervical fusion x 2  . CARDIAC CATHETERIZATION    . CERVICAL DISCECTOMY     x 2  . COLONOSCOPY    . COLONOSCOPY N/A 07/25/2015   Procedure: COLONOSCOPY;  Surgeon: Lollie Sails, MD;  Location: [facility] ENDOSCOPY;  Service:  Endoscopy;  Laterality: N/A;  . COLONOSCOPY WITH PROPOFOL N/A 10/04/2017   Procedure: COLONOSCOPY WITH PROPOFOL;  Surgeon: Lollie Sails, MD;  Location: [facility] ENDOSCOPY;  Service: Endoscopy;  Laterality: N/A;  . ELECTROMAGNETIC NAVIGATION BROCHOSCOPY Left 06/28/2016   Procedure: ELECTROMAGNETIC NAVIGATION BRONCHOSCOPY;  Surgeon: Vilinda Boehringer, MD;  Location: [facility] ORS;  Service: Cardiopulmonary;  Laterality: Left;  . ENDOBRONCHIAL ULTRASOUND N/A 04/11/2018   Procedure: ENDOBRONCHIAL ULTRASOUND;  Surgeon: [name], MD;  Location: [facility] ORS;  Service: Cardiopulmonary;  Laterality: N/A;  . ESOPHAGOGASTRODUODENOSCOPY N/A 07/25/2015   Procedure: ESOPHAGOGASTRODUODENOSCOPY (EGD);  Surgeon: Lollie Sails, MD;  Location: [facility] ENDOSCOPY;  Service: Endoscopy;  Laterality: N/A;  . NASAL SINUS SURGERY     x 2   . PORTA CATH INSERTION N/A 04/24/2018   Procedure: PORTA CATH INSERTION;  Surgeon: Algernon Huxley, MD;  Location: [place] [facility] LAB;  Service: Cardiovascular;  Laterality: N/A;  . rotator cuff surgery Right    07/2016  . SEPTOPLASTY    . SKIN GRAFT      FAMILY HISTORY Family History  Problem Relation Age of Onset  . Heart disease Father   . Prostate cancer Father   . Heart disease Paternal Grandmother   . Heart attack Maternal Grandfather 52  . Kidney cancer Neg Hx   . Bladder Cancer Neg Hx   . Other Neg Hx        pituitary abnormality       ADVANCED DIRECTIVES:    HEALTH MAINTENANCE: Social History   Tobacco Use  . Smoking status: Current Every Day Smoker    Packs/day: 1.50    Years: 45.00    Pack years: 67.50    Types: Cigarettes  . Smokeless tobacco: Never Used  Vaping Use  . Vaping Use: Never used  Substance Use Topics  . Alcohol use: Yes    Alcohol/week: 2.0 standard drinks    Types: 2 Standard drinks or equivalent per week    Comment: moderate  . Drug use: No     Allergies  Allergen Reactions  . Lisinopril Rash  . Varenicline Rash    Current  Outpatient Medications  Medication Sig Dispense Refill  . acetaminophen (TYLENOL) 500 MG tablet Take 1,000 mg by mouth every 6 (six) hours as needed (for pain.).    Marland Kitchen albuterol (PROAIR HFA) 108 (90 Base) MCG/ACT inhaler Inhale 2 puffs into the lungs every 4 (four) hours as needed for wheezing or shortness of breath. 1 Inhaler 1  . atorvastatin (LIPITOR) 40 MG tablet Take 40 mg by mouth at bedtime.     . busPIRone (BUSPAR) 15 MG tablet Take 15 mg by mouth 2 (two) times daily.   10  . diazepam (VALIUM) 5 MG tablet Take 2 tablets (10 mg total) by mouth every 12 (twelve) hours as needed for anxiety. 60 tablet 0  . DULoxetine (CYMBALTA) 60 MG capsule Take 1 capsule by mouth 1 day or 1 dose.    . fluticasone (FLONASE) 50 MCG/ACT nasal spray  Place 1 spray into both nostrils daily as needed (for allergies.).     Marland Kitchen ipratropium-albuterol (DUONEB) 0.5-2.5 (3) MG/3ML SOLN Take 3 mLs by nebulization every 6 (six) hours. 360 mL 5  . levothyroxine (SYNTHROID) 25 MCG tablet TAKE 1 TABLET (25 MCG TOTAL) BY MOUTH DAILY BEFORE BREAKFAST. (Patient taking differently: Take 150 mcg by mouth daily before breakfast. ) 30 tablet 1  . lidocaine (XYLOCAINE) 2 % solution   2  . lidocaine-prilocaine (EMLA) cream Apply 1 application topically as needed.    . metoprolol succinate (TOPROL-XL) 25 MG 24 hr tablet Take 25 mg by mouth daily.    . Multiple Vitamin (MULTIVITAMIN WITH MINERALS) TABS tablet Take 1 tablet by mouth daily.     . Multiple Vitamins-Minerals (SUPER VIKAPS) TABS Take by mouth.    Marland Kitchen MYRBETRIQ 50 MG TB24 tablet Take 50 mg by mouth daily.    . ondansetron (ZOFRAN) 8 MG tablet Take 8 mg by mouth every 8 (eight) hours as needed for nausea or vomiting.    . OXYGEN Inhale 2 L into the lungs at bedtime.     . pantoprazole (PROTONIX) 40 MG tablet Take 40 mg by mouth daily.  3  . potassium chloride (K-DUR) 10 MEQ tablet TAKE 1 TABLET (10 MEQ TOTAL) BY MOUTH ONCE DAILY.  3  . propranolol (INNOPRAN XL) 80 MG 24 hr  capsule Take by mouth.    . umeclidinium-vilanterol (ANORO ELLIPTA) 62.5-25 MCG/INH AEPB Inhale 1 puff into the lungs daily. 1 each 10  . levofloxacin (LEVAQUIN) 500 MG tablet Take 1 tablet (500 mg total) by mouth daily. 10 tablet 0  . nitroGLYCERIN (NITROSTAT) 0.4 MG SL tablet Place 0.4 mg under the tongue every 5 (five) minutes as needed for chest pain.      No current facility-administered medications for this visit.   Facility-Administered Medications Ordered in Other Visits  Medication Dose Route Frequency Provider Last Rate Last Admin  . heparin lock flush 100 unit/mL  500 Units Intravenous Once Grayland Ormond Kathlene November, MD        OBJECTIVE: There were no vitals filed for this visit.   There is no height or weight on file to calculate BMI.    ECOG FS:0 - Asymptomatic  General: Well-developed, well-nourished, no acute distress. HEENT: Normocephalic. Neuro: Alert, answering all questions appropriately. Cranial nerves grossly intact. Psych: Normal affect.   LAB RESULTS:  Lab Results  Component Value Date   NA 132 (L) 04/24/2020   K 4.0 04/24/2020   CL 97 (L) 04/24/2020   CO2 25 04/24/2020   GLUCOSE 109 (H) 04/24/2020   BUN 8 04/24/2020   CREATININE 0.79 04/24/2020   CALCIUM 8.8 (L) 04/24/2020   PROT 6.9 04/24/2020   ALBUMIN 4.1 04/24/2020   AST 29 04/24/2020   ALT 24 04/24/2020   ALKPHOS 85 04/24/2020   BILITOT 0.7 04/24/2020   GFRNONAA >60 04/24/2020   GFRAA >60 04/24/2020    Lab Results  Component Value Date   WBC 7.6 04/24/2020   NEUTROABS 5.8 04/24/2020   HGB 15.1 04/24/2020   HCT 41.9 04/24/2020   MCV 95.4 04/24/2020   PLT 239 04/24/2020     STUDIES:  ONCOLOGY HISTORY: Case discussed with pathologist and unable to determine whether this is adenocarcinoma or squamous cell carcinoma.  There is also insufficient tissue to do further testing.  Liquid biopsy did not reveal any actionable mutations.  MRI of the brain completed on Mar 28, 2018 reviewed  independently did not reveal metastatic  disease.  Patient completed XRT June 26, 2018.  He completed his concurrent single agent carboplatinum on June 21, 2018.  Patient had a reaction to Taxol during cycle 1 and this was discontinued.  He completed a year-long of maintenance durvalumab on June 27, 2019.   ASSESSMENT: Stage IIIa non-small cell lung cancer, right middle lobe.  PLAN:    1. Stage IIIa non-small cell lung cancer, right middle lobe: CT scan results from April 24, 2020 reviewed independently and report as above with slight increase in size of right upper lobe nodularity.  Case was discussed at tumor board and recommendation was PET scan for further evaluation.  PET scan results from May 19, 2020 reviewed independently and report as above with overall improvement of disease burden.  Recommendations are consider resection or continued observation.  After lengthy discussion with the patient, it was agreed on continued observation and we will repeat CT scan in 3 months to assess for interval change.  Return to clinic 1 to 2 days later to discuss the results. 2.  Secondary polycythemia: Resolved.  Likely secondary to heavy tobacco use.  Previously, the remainder of his laboratory work, including JAK-2 mutation, was either negative or within normal limits.  3.  Pulmonary nodule: Patient noted to have 2 left upper lobe lung nodules measuring 0.5 cm and 0.69 cm that are both stable and unchanged.  Monitor. 4.  Colon polyps:  Patient has a personal history of greater then 10 adenomatous polyps on his most recent conoloscopy. He does not know of any family history of increased polyps or colon cancer.  Genetic testing to assess for the APC mutation for FAP or AFAP was negative. Continue colonoscopies as per GI. 5. Tobacco Use: Patient continues to heavily smoke.  He expressed understanding by continuing tobacco use increases his chance of recurrence. 6. Anxiety: Chronic. Continue Valium 10 mg every  12 hours as needed.  Continue treatment and evaluation per primary care. 7.  Hyponatremia: Chronic and unchanged.  Patient's most recent sodium levels are 132. 8.  Hypothyroidism: Appreciate endocrinology input.  Continue Synthroid as prescribed.  Follow-up with endocrinology as scheduled. 9.  CHF: Patient has an EF of 40%.  Continue evaluation and treatment per cardiology.  I provided 20 minutes of face-to-face video visit time during this encounter which included chart review, counseling, and coordination of care as documented above.   Patient expressed understanding and was in agreement with this plan. He also understands that He can call clinic at any time with any questions, concerns, or complaints.    [name], MD   05/23/2020 9:35 AM

## 2020-05-19 ENCOUNTER — Other Ambulatory Visit: Payer: Self-pay

## 2020-05-19 ENCOUNTER — Ambulatory Visit
Admission: RE | Admit: 2020-05-19 | Discharge: 2020-05-19 | Disposition: A | Discharge status: Home / Self Care | Payer: BC Managed Care – PPO | Source: Ambulatory Visit | Attending: Oncology | Admitting: Oncology

## 2020-05-19 DIAGNOSIS — N200 Calculus of kidney: Secondary | ICD-10-CM | POA: Insufficient documentation

## 2020-05-19 DIAGNOSIS — C3491 Malignant neoplasm of unspecified part of right bronchus or lung: Secondary | ICD-10-CM | POA: Diagnosis present

## 2020-05-19 DIAGNOSIS — I700 Atherosclerosis of aorta: Secondary | ICD-10-CM | POA: Insufficient documentation

## 2020-05-19 DIAGNOSIS — I2510 Atherosclerotic heart disease of native coronary artery without angina pectoris: Secondary | ICD-10-CM | POA: Insufficient documentation

## 2020-05-19 DIAGNOSIS — J439 Emphysema, unspecified: Secondary | ICD-10-CM | POA: Diagnosis not present

## 2020-05-19 DIAGNOSIS — K7689 Other specified diseases of liver: Secondary | ICD-10-CM | POA: Diagnosis not present

## 2020-05-19 DIAGNOSIS — Q2549 Other congenital malformations of aorta: Secondary | ICD-10-CM | POA: Insufficient documentation

## 2020-05-19 DIAGNOSIS — I999 Unspecified disorder of circulatory system: Secondary | ICD-10-CM | POA: Insufficient documentation

## 2020-05-19 LAB — GLUCOSE, CAPILLARY: Glucose-Capillary: 96 mg/dL (ref 70–99)

## 2020-05-19 MED ORDER — FLUDEOXYGLUCOSE F - 18 (FDG) INJECTION
10.4000 | Freq: Once | INTRAVENOUS | Status: AC | PRN
  Administered 2020-05-19: 10.86 via INTRAVENOUS

## 2020-05-20 ENCOUNTER — Encounter: Payer: Self-pay | Admitting: Oncology

## 2020-05-20 ENCOUNTER — Inpatient Hospital Stay (HOSPITAL_BASED_OUTPATIENT_CLINIC_OR_DEPARTMENT_OTHER): Payer: BC Managed Care – PPO | Admitting: Oncology

## 2020-05-20 DIAGNOSIS — C3491 Malignant neoplasm of unspecified part of right bronchus or lung: Secondary | ICD-10-CM | POA: Diagnosis not present

## 2020-05-20 MED ORDER — LEVOFLOXACIN 500 MG PO TABS
500.0000 mg | ORAL_TABLET | Freq: Every day | ORAL | 0 refills | Status: DC
Start: 2020-05-20 — End: 2020-07-17

## 2020-05-20 NOTE — Progress Notes (Signed)
Patient denies any concerns today.  

## 2020-06-12 ENCOUNTER — Other Ambulatory Visit: Payer: Self-pay | Admitting: *Deleted

## 2020-06-12 ENCOUNTER — Telehealth: Payer: Self-pay

## 2020-06-12 ENCOUNTER — Encounter: Payer: Self-pay | Admitting: Radiation Oncology

## 2020-06-12 ENCOUNTER — Other Ambulatory Visit: Payer: Self-pay

## 2020-06-12 ENCOUNTER — Ambulatory Visit
Admission: RE | Admit: 2020-06-12 | Discharge: 2020-06-12 | Disposition: A | Discharge status: Home / Self Care | Payer: BC Managed Care – PPO | Source: Ambulatory Visit | Attending: Radiation Oncology | Admitting: Radiation Oncology

## 2020-06-12 VITALS — BP 131/88 | HR 92 | Temp 96.2°F | Wt 205.0 lb

## 2020-06-12 DIAGNOSIS — Z923 Personal history of irradiation: Secondary | ICD-10-CM | POA: Diagnosis not present

## 2020-06-12 DIAGNOSIS — R918 Other nonspecific abnormal finding of lung field: Secondary | ICD-10-CM | POA: Insufficient documentation

## 2020-06-12 DIAGNOSIS — C342 Malignant neoplasm of middle lobe, bronchus or lung: Secondary | ICD-10-CM | POA: Diagnosis present

## 2020-06-12 MED ORDER — LIDOCAINE-PRILOCAINE 2.5-2.5 % EX CREA: 1.0000 "application " | TOPICAL_CREAM | CUTANEOUS | 1 refills | Status: DC | PRN

## 2020-06-12 NOTE — Progress Notes (Signed)
Radiation Oncology Follow up Note  Name: Paul Carpenter   Date:   06/12/2020 MRN:  [id number] DOB: August 16, 1956    This 64 y.o. male presents to the clinic today for 2-year follow-up.  Status post concurrent chemoradiation therapy for stage IIIa non-small cell lung cancer of the right middle lobe  REFERRING PROVIDER: Kirk Ruths, MD  HPI: Patient is a 64 year old male previously treated 2 years prior for stage IIIa non-small cell lung cancer of the right middle lobe.  He is seen today in routine follow-up.  He has completed immunotherapy..  Recent PET CT scan in July showed a cavitary right apical lung nodule with bandlike density with SUV of 3.3.  There are some thickening around this nodule possible that this is accentuated activity visible today is inflammatory but difficult to exclude low-grade malignancy.  He has some other pulmonary nodules which are stable in the left lung.  They have opted to repeat his scans in about 3 months and continue to observe.  He is asymptomatic is a mild nonproductive cough specifically denies chest pain dysphagia or weight loss.  COMPLICATIONS OF TREATMENT: none  FOLLOW UP COMPLIANCE: keeps appointments   PHYSICAL EXAM:  BP 131/88 (BP Location: Right Arm, Patient Position: Sitting, Cuff Size: Normal)   Pulse 92   Temp (!) 96.2 F (35.7 C) (Tympanic)   Wt 205 lb (93 kg)   BMI 27.80 kg/m  Well-developed well-nourished patient in NAD. HEENT reveals PERLA, EOMI, discs not visualized.  Oral cavity is clear. No oral mucosal lesions are identified. Neck is clear without evidence of cervical or supraclavicular adenopathy. Lungs are clear to A&P. Cardiac examination is essentially unremarkable with regular rate and rhythm without murmur rub or thrill. Abdomen is benign with no organomegaly or masses noted. Motor sensory and DTR levels are equal and symmetric in the upper and lower extremities. Cranial nerves II through XII are grossly intact. Proprioception  is intact. No peripheral adenopathy or edema is identified. No motor or sensory levels are noted. Crude visual fields are within normal range.  RADIOLOGY RESULTS: CT scans and PET CT scans reviewed  PLAN: Present time I agree with continue to observe this patient.  I believe this area continues to grow after the CT scan in 3 months would opt for CT-guided biopsy.  Certainly retreatment of this area or possible surgical resection by Dr. Faith Rogue would be feasible if this does prove to be malignancy.  Patient and wife are both involved in our conversation today.  I will see him back shortly after his repeat CT scan.  I would like to take this opportunity to thank you for allowing me to participate in the care of your patient.[name], MD

## 2020-06-19 ENCOUNTER — Other Ambulatory Visit: Payer: Self-pay

## 2020-06-19 ENCOUNTER — Inpatient Hospital Stay: Payer: BC Managed Care – PPO | Attending: Oncology

## 2020-06-19 DIAGNOSIS — Z95828 Presence of other vascular implants and grafts: Secondary | ICD-10-CM

## 2020-06-19 DIAGNOSIS — C342 Malignant neoplasm of middle lobe, bronchus or lung: Secondary | ICD-10-CM | POA: Diagnosis present

## 2020-06-19 DIAGNOSIS — R7309 Other abnormal glucose: Secondary | ICD-10-CM | POA: Insufficient documentation

## 2020-06-19 DIAGNOSIS — Z452 Encounter for adjustment and management of vascular access device: Secondary | ICD-10-CM | POA: Diagnosis present

## 2020-06-19 DIAGNOSIS — C3491 Malignant neoplasm of unspecified part of right bronchus or lung: Secondary | ICD-10-CM

## 2020-06-19 MED ORDER — HEPARIN SOD (PORK) LOCK FLUSH 100 UNIT/ML IV SOLN
500.0000 [IU] | Freq: Once | INTRAVENOUS | Status: AC
  Administered 2020-06-19: 500 [IU] via INTRAVENOUS
  Filled 2020-06-19: qty 5

## 2020-06-19 MED ORDER — SODIUM CHLORIDE 0.9% FLUSH
10.0000 mL | Freq: Once | INTRAVENOUS | Status: AC
  Administered 2020-06-19: 10 mL via INTRAVENOUS
  Filled 2020-06-19: qty 10

## 2020-06-19 MED ORDER — HEPARIN SOD (PORK) LOCK FLUSH 100 UNIT/ML IV SOLN
INTRAVENOUS | Status: AC
  Filled 2020-06-19: qty 5

## 2020-06-23 ENCOUNTER — Other Ambulatory Visit: Payer: Self-pay | Admitting: Urology

## 2020-06-23 ENCOUNTER — Other Ambulatory Visit: Payer: Self-pay

## 2020-06-23 MED ORDER — MYRBETRIQ 50 MG PO TB24: 50.0000 mg | ORAL_TABLET | Freq: Every day | ORAL | 0 refills | Status: DC

## 2020-07-08 ENCOUNTER — Other Ambulatory Visit
Admission: RE | Admit: 2020-07-08 | Discharge: 2020-07-08 | Disposition: A | Discharge status: Home / Self Care | Payer: BC Managed Care – PPO | Source: Ambulatory Visit | Attending: Internal Medicine | Admitting: Internal Medicine

## 2020-07-08 ENCOUNTER — Other Ambulatory Visit: Payer: Self-pay

## 2020-07-08 DIAGNOSIS — Z20822 Contact with and (suspected) exposure to covid-19: Secondary | ICD-10-CM | POA: Insufficient documentation

## 2020-07-08 DIAGNOSIS — Z01812 Encounter for preprocedural laboratory examination: Secondary | ICD-10-CM | POA: Insufficient documentation

## 2020-07-09 LAB — SARS CORONAVIRUS 2 (TAT 6-24 HRS): SARS Coronavirus 2: NEGATIVE

## 2020-07-10 ENCOUNTER — Ambulatory Visit: Payer: BC Managed Care – PPO | Admitting: Anesthesiology

## 2020-07-10 ENCOUNTER — Encounter: Payer: Self-pay | Admitting: Internal Medicine

## 2020-07-10 ENCOUNTER — Ambulatory Visit
Admission: RE | Admit: 2020-07-10 | Discharge: 2020-07-10 | Disposition: A | Discharge status: Home / Self Care | Payer: BC Managed Care – PPO | Attending: Internal Medicine | Admitting: Internal Medicine

## 2020-07-10 ENCOUNTER — Ambulatory Visit: Admit: 2020-07-10 | Payer: BC Managed Care – PPO | Admitting: Internal Medicine

## 2020-07-10 ENCOUNTER — Encounter
Admission: RE | Disposition: A | Discharge status: Home / Self Care | Payer: Self-pay | Source: Home / Self Care | Attending: Internal Medicine

## 2020-07-10 ENCOUNTER — Other Ambulatory Visit: Payer: Self-pay

## 2020-07-10 DIAGNOSIS — I509 Heart failure, unspecified: Secondary | ICD-10-CM | POA: Diagnosis not present

## 2020-07-10 DIAGNOSIS — K219 Gastro-esophageal reflux disease without esophagitis: Secondary | ICD-10-CM | POA: Diagnosis not present

## 2020-07-10 DIAGNOSIS — L83 Acanthosis nigricans: Secondary | ICD-10-CM | POA: Insufficient documentation

## 2020-07-10 DIAGNOSIS — Z9221 Personal history of antineoplastic chemotherapy: Secondary | ICD-10-CM | POA: Insufficient documentation

## 2020-07-10 DIAGNOSIS — J449 Chronic obstructive pulmonary disease, unspecified: Secondary | ICD-10-CM | POA: Diagnosis not present

## 2020-07-10 DIAGNOSIS — R1314 Dysphagia, pharyngoesophageal phase: Secondary | ICD-10-CM | POA: Insufficient documentation

## 2020-07-10 DIAGNOSIS — Z923 Personal history of irradiation: Secondary | ICD-10-CM | POA: Insufficient documentation

## 2020-07-10 DIAGNOSIS — R12 Heartburn: Secondary | ICD-10-CM | POA: Insufficient documentation

## 2020-07-10 DIAGNOSIS — Z1211 Encounter for screening for malignant neoplasm of colon: Secondary | ICD-10-CM | POA: Insufficient documentation

## 2020-07-10 DIAGNOSIS — E039 Hypothyroidism, unspecified: Secondary | ICD-10-CM | POA: Insufficient documentation

## 2020-07-10 DIAGNOSIS — F419 Anxiety disorder, unspecified: Secondary | ICD-10-CM | POA: Diagnosis not present

## 2020-07-10 DIAGNOSIS — Z85118 Personal history of other malignant neoplasm of bronchus and lung: Secondary | ICD-10-CM | POA: Insufficient documentation

## 2020-07-10 DIAGNOSIS — I25119 Atherosclerotic heart disease of native coronary artery with unspecified angina pectoris: Secondary | ICD-10-CM | POA: Insufficient documentation

## 2020-07-10 DIAGNOSIS — K621 Rectal polyp: Secondary | ICD-10-CM | POA: Diagnosis not present

## 2020-07-10 DIAGNOSIS — G4733 Obstructive sleep apnea (adult) (pediatric): Secondary | ICD-10-CM | POA: Diagnosis not present

## 2020-07-10 DIAGNOSIS — D125 Benign neoplasm of sigmoid colon: Secondary | ICD-10-CM | POA: Insufficient documentation

## 2020-07-10 DIAGNOSIS — I110 Hypertensive heart disease with heart failure: Secondary | ICD-10-CM | POA: Diagnosis not present

## 2020-07-10 DIAGNOSIS — Z79899 Other long term (current) drug therapy: Secondary | ICD-10-CM | POA: Diagnosis not present

## 2020-07-10 DIAGNOSIS — K2970 Gastritis, unspecified, without bleeding: Secondary | ICD-10-CM | POA: Diagnosis not present

## 2020-07-10 DIAGNOSIS — Z79890 Hormone replacement therapy (postmenopausal): Secondary | ICD-10-CM | POA: Insufficient documentation

## 2020-07-10 DIAGNOSIS — K640 First degree hemorrhoids: Secondary | ICD-10-CM | POA: Diagnosis not present

## 2020-07-10 HISTORY — PX: ESOPHAGOGASTRODUODENOSCOPY (EGD) WITH PROPOFOL: SHX5813

## 2020-07-10 HISTORY — PX: COLONOSCOPY WITH PROPOFOL: SHX5780

## 2020-07-10 SURGERY — EGD (ESOPHAGOGASTRODUODENOSCOPY)
Anesthesia: General

## 2020-07-10 SURGERY — COLONOSCOPY WITH PROPOFOL
Anesthesia: General

## 2020-07-10 MED ORDER — LIDOCAINE HCL (PF) 2 % IJ SOLN
INTRAMUSCULAR | Status: DC | PRN
  Administered 2020-07-10: 100 mg via INTRADERMAL

## 2020-07-10 MED ORDER — PROPOFOL 10 MG/ML IV BOLUS
INTRAVENOUS | Status: DC | PRN
  Administered 2020-07-10: 30 mg via INTRAVENOUS
  Administered 2020-07-10: 70 mg via INTRAVENOUS
  Administered 2020-07-10: 40 mg via INTRAVENOUS

## 2020-07-10 MED ORDER — PROPOFOL 500 MG/50ML IV EMUL
INTRAVENOUS | Status: AC
  Filled 2020-07-10: qty 50

## 2020-07-10 MED ORDER — SODIUM CHLORIDE 0.9 % IV SOLN: INTRAVENOUS | Status: DC

## 2020-07-10 MED ORDER — LIDOCAINE HCL (PF) 2 % IJ SOLN: INTRAMUSCULAR | Status: DC | PRN

## 2020-07-10 MED ORDER — PROPOFOL 500 MG/50ML IV EMUL
INTRAVENOUS | Status: DC | PRN
  Administered 2020-07-10: 200 ug/kg/min via INTRAVENOUS

## 2020-07-10 NOTE — Op Note (Signed)
[facility] Gastroenterology Patient Name: Paul Carpenter Procedure Date: 07/10/2020 8:34 AM MRN: [id number] Account #: [id number] Date of Birth: [birth year] Admit Type: Outpatient Age: 64 Room: [facility] ENDO ROOM 2 Gender: Male Note Status: Finalized Procedure:             Upper GI endoscopy Indications:           Oropharyngeal phase dysphagia, Heartburn, Dyspepsia Providers:             Benay Pike. Alice Reichert MD, MD Referring MD:          Ocie Cornfield. Ouida Sills MD, MD (Referring MD) Medicines:             Propofol per Anesthesia Complications:         No immediate complications. Procedure:             Pre-Anesthesia Assessment:                        - The risks and benefits of the procedure and the                         sedation options and risks were discussed with the                         patient. All questions were answered and informed                         consent was obtained.                        - Patient identification and proposed procedure were                         verified prior to the procedure by the nurse. The                         procedure was verified in the procedure room.                        - ASA Grade Assessment: III - A patient with severe                         systemic disease.                        - After reviewing the risks and benefits, the patient                         was deemed in satisfactory condition to undergo the                         procedure.                        After obtaining informed consent, the endoscope was                         passed under direct vision. Throughout the procedure,                         the  patient's blood pressure, pulse, and oxygen                         saturations were monitored continuously. The Endoscope                         was introduced through the mouth, and advanced to the                         third part of duodenum. The upper GI endoscopy was                          accomplished without difficulty. The patient tolerated                         the procedure well. Findings:      The Z-line was irregular and was found 37 to 38 cm from the incisors.       Mucosa was biopsied with a cold forceps for histology. One specimen       bottle was sent to pathology.      Mild radiation esophagitis with telangiectasia [Bleeding] was found in       the mid esophagus. This was biopsied with a cold forceps for histology.      Patchy mild inflammation characterized by erythema was found in the       gastric body and in the gastric antrum. Biopsies were taken with a cold       forceps for Helicobacter pylori testing.      The cardia and gastric fundus were normal on retroflexion.      The examined duodenum was normal. Impression:            - Z-line irregular, 37 to 38 cm from the incisors.                         Biopsied.                        - [Grade] radiation esophagitis with no bleeding.                         Biopsied.                        - Gastritis. Biopsied.                        - Normal examined duodenum. Recommendation:        - Await pathology results.                        - Proceed with colonoscopy Procedure Code(s):     --- Professional ---                        [id number], Esophagogastroduodenoscopy, flexible,                         transoral; with biopsy, single or multiple Diagnosis Code(s):     --- Professional ---  R10.13, Epigastric pain                        R12, Heartburn                        R13.12, Dysphagia, oropharyngeal phase                        K29.70, Gastritis, unspecified, without bleeding                        K20.80, Other esophagitis without bleeding                        T66.XXXA, Radiation sickness, unspecified, initial                         encounter                        K22.8, Other specified diseases of esophagus CPT copyright 2019 [employer]. All rights  reserved. The codes documented in this report are preliminary and upon coder review may  be revised to meet current compliance requirements. Efrain Sella MD, MD 07/10/2020 8:53:43 AM This report has been signed electronically. Number of Addenda: 0 Note Initiated On: 07/10/2020 8:34 AM Estimated Blood Loss:  Estimated blood loss: none.      [facility]

## 2020-07-10 NOTE — Op Note (Signed)
[facility] Patient Name: Paul Carpenter Procedure Date: 07/10/2020 8:33 AM MRN: [id number] Account #: [id number] Date of Birth: 12-29-1955 Admit Type: Outpatient Age: 64 Room: [facility] ENDO ROOM 2 Gender: Male Note Status: Finalized Procedure:             Colonoscopy Indications:           High risk colon cancer surveillance: Personal history                         of colonic polyps Providers:             Benay Pike. Alice Reichert MD, MD Referring MD:          Ocie Cornfield. Ouida Sills MD, MD (Referring MD) Medicines:             Propofol per Anesthesia Complications:         No immediate complications. Procedure:             Pre-Anesthesia Assessment:                        - The risks and benefits of the procedure and the                         sedation options and risks were discussed with the                         patient. All questions were answered and informed                         consent was obtained.                        - Patient identification and proposed procedure were                         verified prior to the procedure by the nurse. The                         procedure was verified in the procedure room.                        - ASA Grade Assessment: III - A patient with severe                         systemic disease.                        - After reviewing the risks and benefits, the patient                         was deemed in satisfactory condition to undergo the                         procedure.                        After obtaining informed consent, the colonoscope was                         passed under direct vision. Throughout  the procedure,                         the patient's blood pressure, pulse, and oxygen                         saturations were monitored continuously. The                         Colonoscope was introduced through the anus and                         advanced to the the cecum, identified by  appendiceal                         orifice and ileocecal valve. The colonoscopy was                         performed without difficulty. The patient tolerated                         the procedure well. The quality of the bowel                         preparation was good. The ileocecal valve, appendiceal                         orifice, and rectum were photographed. Findings:      The perianal and digital rectal examinations were normal. Pertinent       negatives include normal sphincter tone and no palpable rectal lesions.      A 10 mm polyp was found in the proximal sigmoid colon. The polyp was       sessile. The polyp was removed with a hot snare. Resection and retrieval       were complete.      Two sessile polyps were found in the mid sigmoid colon. The polyps were       4 to 5 mm in size. These polyps were removed with a cold snare.       Resection and retrieval were complete.      A 4 mm polyp was found in the transverse colon. The polyp was sessile.       The polyp was removed with a jumbo cold forceps. Resection and retrieval       were complete.      Nine sessile polyps were found in the rectum. The polyps were 4 to 8 mm       in size. These polyps were removed with a hot snare. Resection and       retrieval were complete.      Non-bleeding internal hemorrhoids were found during retroflexion. The       hemorrhoids were Grade I (internal hemorrhoids that do not prolapse).      Multiple small and large-mouthed diverticula were found in the sigmoid       colon.      The exam was otherwise without abnormality. Impression:            - One 10 mm polyp in the proximal sigmoid colon,  removed with a hot snare. Resected and retrieved.                        - Two 4 to 5 mm polyps in the mid sigmoid colon,                         removed with a cold snare. Resected and retrieved.                        - One 4 mm polyp in the transverse colon, removed with                          a jumbo cold forceps. Resected and retrieved.                        - Nine 4 to 8 mm polyps in the rectum, removed with a                         hot snare. Resected and retrieved.                        - Non-bleeding internal hemorrhoids.                        - Diverticulosis in the sigmoid colon.                        - The examination was otherwise normal. Recommendation:        - Await pathology results from EGD, also performed                         today.                        - Patient has a contact number available for                         emergencies. The signs and symptoms of potential                         delayed complications were discussed with the patient.                         Return to normal activities tomorrow. Written                         discharge instructions were provided to the patient.                        - Resume previous diet.                        - Continue present medications.                        - Await pathology results.                        - Repeat colonoscopy date to be determined after  pending pathology results are reviewed for                         surveillance.                        - The findings and recommendations were discussed with                         the patient.                        - Perform a barium swallow using barium in liquid and                         tablet form at appointment to be scheduled. Procedure Code(s):     --- Professional ---                        [phone], Colonoscopy, flexible; with removal of                         tumor(s), polyp(s), or other lesion(s) by snare                         technique                        [id number], 67, Colonoscopy, flexible; with biopsy, single                         or multiple Diagnosis Code(s):     --- Professional ---                        K57.30, Diverticulosis of large intestine without                          perforation or abscess without bleeding                        K64.0, First degree hemorrhoids                        K62.1, Rectal polyp                        K63.5, Polyp of colon                        Z86.010, Personal history of colonic polyps CPT copyright 2019 [employer]. All rights reserved. The codes documented in this report are preliminary and upon coder review may  be revised to meet current compliance requirements. Efrain Sella MD, MD 07/10/2020 9:35:37 AM This report has been signed electronically. Number of Addenda: 0 Note Initiated On: 07/10/2020 8:33 AM Scope Withdrawal Time: 0 hours 15 minutes 17 seconds  Total Procedure Duration: 0 hours 30 minutes 5 seconds  Estimated Blood Loss:  Estimated blood loss: none.      [facility]

## 2020-07-10 NOTE — Anesthesia Procedure Notes (Signed)
Date/Time: 07/10/2020 8:45 AM Performed by: Nelda Marseille, CRNA Pre-anesthesia Checklist: Patient identified, Emergency Drugs available, Suction available, Patient being monitored and Timeout performed Oxygen Delivery Method: Nasal cannula

## 2020-07-10 NOTE — Transfer of Care (Signed)
Immediate Anesthesia Transfer of Care Note  Patient: Paul Carpenter  Procedure(s) Performed: COLONOSCOPY WITH PROPOFOL (N/A ) ESOPHAGOGASTRODUODENOSCOPY (EGD) WITH PROPOFOL (N/A )  Patient Location: PACU  Anesthesia Type:General  Level of Consciousness: sedated  Airway & Oxygen Therapy: Patient Spontanous Breathing and Patient connected to nasal cannula oxygen  Post-op Assessment: Report given to RN and Post -op Vital signs reviewed and stable  Post vital signs: Reviewed and stable  Last Vitals:  Vitals Value Taken Time  BP 125/79 07/10/20 0932  Temp 36 C 07/10/20 0932  Pulse 84 07/10/20 0934  Resp 19 07/10/20 0934  SpO2 98 % 07/10/20 0934  Vitals shown include unvalidated device data.  Last Pain:  Vitals:   07/10/20 0932  TempSrc: Temporal  PainSc: 0-No pain         Complications: No complications documented.

## 2020-07-10 NOTE — H&P (Signed)
Outpatient short stay form Pre-procedure 07/10/2020 8:36 AM Kamry Faraci K. Alice Reichert, M.D.  Primary Physician: Frazier Richards, M.D.  Reason for visit:  Dysphagia, oropharyngeal, dyspepsia, hx of adenomatous colon polyps.  History of present illness:  Patient with dysphagia, mainly oropharyngeal with a small component of esophageal "slowing". No weight loss, eats well overall. Had XRT to chest last year for hx of lung cancer. Currently in remission.                           Patient presents for colonoscopy for a personal hx of colon polyps. The patient denies abdominal pain, abnormal weight loss or rectal bleeding.      Current Facility-Administered Medications:  .  0.9 %  sodium chloride infusion, , Intravenous, Continuous, Wiley Ford, Benay Pike, MD, Last Rate: 20 mL/hr at 07/10/20 0806, New Bag at 07/10/20 0806  Facility-Administered Medications Ordered in Other Encounters:  .  heparin lock flush 100 unit/mL, 500 Units, Intravenous, Once, [name], MD  Medications Prior to Admission  Medication Sig Dispense Refill Last Dose  . acetaminophen (TYLENOL) 500 MG tablet Take 1,000 mg by mouth every 6 (six) hours as needed (for pain.).   Past Week at Unknown time  . albuterol (PROAIR HFA) 108 (90 Base) MCG/ACT inhaler Inhale 2 puffs into the lungs every 4 (four) hours as needed for wheezing or shortness of breath. 1 Inhaler 1 07/09/2020 at 0800  . atorvastatin (LIPITOR) 40 MG tablet Take 40 mg by mouth at bedtime.    07/09/2020 at 1800  . busPIRone (BUSPAR) 15 MG tablet Take 15 mg by mouth 2 (two) times daily.   10 07/09/2020 at 1800  . diazepam (VALIUM) 5 MG tablet Take 2 tablets (10 mg total) by mouth every 12 (twelve) hours as needed for anxiety. (Patient taking differently: Take 5 mg by mouth every 8 (eight) hours as needed for anxiety. ) 60 tablet 0 07/09/2020 at 1800  . DULoxetine (CYMBALTA) 60 MG capsule Take 1 capsule by mouth 1 day or 1 dose.   07/09/2020 at 0800  . fluticasone (FLONASE) 50  MCG/ACT nasal spray Place 1 spray into both nostrils daily as needed (for allergies.).    Past Week at Unknown time  . ipratropium-albuterol (DUONEB) 0.5-2.5 (3) MG/3ML SOLN Take 3 mLs by nebulization every 6 (six) hours. 360 mL 5 Past Week at Unknown time  . levothyroxine (SYNTHROID) 150 MCG tablet Take 150 mcg by mouth daily before breakfast.   07/09/2020 at 0800  . lidocaine (XYLOCAINE) 2 % solution   2 Past Week at 0800  . lidocaine-prilocaine (EMLA) cream Apply 1 application topically as needed. 30 g 1 Past Week at 08  . metoprolol succinate (TOPROL-XL) 25 MG 24 hr tablet Take 25 mg by mouth daily.   07/10/2020 at 0600  . Multiple Vitamin (MULTIVITAMIN WITH MINERALS) TABS tablet Take 1 tablet by mouth daily.    Past Week at Unknown time  . Multiple Vitamins-Minerals (SUPER VIKAPS) TABS Take by mouth.   Past Week at Unknown time  . MYRBETRIQ 50 MG TB24 tablet Take 1 tablet (50 mg total) by mouth daily. 30 tablet 0 07/09/2020 at 0800  . ondansetron (ZOFRAN) 8 MG tablet Take 8 mg by mouth every 8 (eight) hours as needed for nausea or vomiting.   Past Week at Unknown time  . OXYGEN Inhale 2 L into the lungs at bedtime.    Past Week at Unknown time  . pantoprazole (PROTONIX) 40  MG tablet Take 40 mg by mouth daily.  3 07/09/2020 at 0800  . potassium chloride (K-DUR) 10 MEQ tablet TAKE 1 TABLET (10 MEQ TOTAL) BY MOUTH ONCE DAILY.  3 Past Week at Unknown time  . Tiotropium Bromide Monohydrate (SPIRIVA RESPIMAT) 2.5 MCG/ACT AERS Inhale into the lungs daily. 2 puffs   07/09/2020 at Unknown time  . umeclidinium-vilanterol (ANORO ELLIPTA) 62.5-25 MCG/INH AEPB Inhale 1 puff into the lungs daily. 1 each 10 07/09/2020 at 0800  . levofloxacin (LEVAQUIN) 500 MG tablet Take 1 tablet (500 mg total) by mouth daily. (Patient not taking: Reported on 06/12/2020) 10 tablet 0 Not Taking at Unknown time  . levothyroxine (SYNTHROID) 25 MCG tablet TAKE 1 TABLET (25 MCG TOTAL) BY MOUTH DAILY BEFORE BREAKFAST. (Patient taking  differently: Take 150 mcg by mouth daily before breakfast. ) 30 tablet 1   . nitroGLYCERIN (NITROSTAT) 0.4 MG SL tablet Place 0.4 mg under the tongue every 5 (five) minutes as needed for chest pain.      Marland Kitchen propranolol (INNOPRAN XL) 80 MG 24 hr capsule Take by mouth.        Allergies  Allergen Reactions  . Lisinopril Rash  . Varenicline Rash     Past Medical History:  Diagnosis Date  . Anginal pain ([place])   . Anxiety   . Chest pain   . CHF (congestive heart failure) ([place])   . Chicken pox   . Complication of anesthesia    o2 dropped after neck fusion  . COPD (chronic obstructive pulmonary disease) ([place])   . Coronary artery disease   . Cough    chronic  clear phlegm  . Dysrhythmia    palpitations  . GERD (gastroesophageal reflux disease)    h/o reflux/ hoarsness  . Hematochezia   . Hemorrhoids   . History of chickenpox   . History of colon polyps   . History of Helicobacter pylori infection   . Hoarseness   . Hypertension   . Lung cancer ([place]) 05/2016   Chemo + rad tx's.   . Migraines   . OSA (obstructive sleep apnea)    has CPAP but does not use  . Personal history of tobacco use, presenting hazards to health 03/05/2016  . Pneumonia    5/17  . Raynaud disease   . Raynaud disease   . Raynaud's disease   . Rotator cuff tear    on right  . Shortness of breath dyspnea   . Sleep apnea   . Ulcer (traumatic) of oral mucosa     Review of systems:  Otherwise negative.    Physical Exam  Gen: Alert, oriented. Appears stated age.  HEENT: [place]/AT. PERRLA. Lungs: CTA, no wheezes. CV: RR nl S1, S2. Abd: soft, benign, no masses. BS+ Ext: No edema. Pulses 2+    Planned procedures: Proceed with EGD and  colonoscopy. The patient understands the nature of the planned procedure, indications, risks, alternatives and potential complications including but not limited to bleeding, infection, perforation, damage to internal organs and possible oversedation/side effects from  anesthesia. The patient agrees and gives consent to proceed.  Please refer to procedure notes for findings, recommendations and patient disposition/instructions.     Sebastiano Luecke K. Alice Reichert, M.D. Gastroenterology 07/10/2020  8:36 AM

## 2020-07-10 NOTE — Anesthesia Postprocedure Evaluation (Signed)
Anesthesia Post Note  Patient: ISACC TURNEY  Procedure(s) Performed: COLONOSCOPY WITH PROPOFOL (N/A ) ESOPHAGOGASTRODUODENOSCOPY (EGD) WITH PROPOFOL (N/A )  Patient location during evaluation: Endoscopy Anesthesia Type: General Level of consciousness: awake and alert Pain management: pain level controlled Vital Signs Assessment: post-procedure vital signs reviewed and stable Respiratory status: spontaneous breathing, nonlabored ventilation and respiratory function stable Cardiovascular status: blood pressure returned to baseline and stable Postop Assessment: no apparent nausea or vomiting Anesthetic complications: no   No complications documented.   Last Vitals:  Vitals:   07/10/20 0932 07/10/20 0952  BP:  (!) 136/92  Pulse:    Resp:    Temp: (!) 36 C   SpO2:      Last Pain:  Vitals:   07/10/20 1002  TempSrc:   PainSc: 0-No pain                 Alphonsus Sias

## 2020-07-10 NOTE — Interval H&P Note (Signed)
History and Physical Interval Note:  07/10/2020 8:38 AM  Paul Carpenter  has presented today for surgery, with the diagnosis of PRS HX COLON POLYPS DYSPEPSIA.  The various methods of treatment have been discussed with the patient and family. After consideration of risks, benefits and other options for treatment, the patient has consented to  Procedure(s): COLONOSCOPY WITH PROPOFOL (N/A) ESOPHAGOGASTRODUODENOSCOPY (EGD) WITH PROPOFOL (N/A) as a surgical intervention.  The patient's history has been reviewed, patient examined, no change in status, stable for surgery.  I have reviewed the patient's chart and labs.  Questions were answered to the patient's satisfaction.     [place], [place]

## 2020-07-10 NOTE — Anesthesia Preprocedure Evaluation (Addendum)
Anesthesia Evaluation  Patient identified by MRN, date of birth, ID band Patient awake    Reviewed: Allergy & Precautions, H&P , NPO status , reviewed documented beta blocker date and time   History of Anesthesia Complications (+) history of anesthetic complications  Airway Mallampati: III  TM Distance: >3 FB Neck ROM: limited    Dental  (+) Caps   Pulmonary shortness of breath, sleep apnea , pneumonia, COPD, Current Smoker and Patient abstained from smoking.,  Sleep Apnea resolved w weight loss   Pulmonary exam normal        Cardiovascular hypertension, + angina + CAD and +CHF  Normal cardiovascular exam+ dysrhythmias      Neuro/Psych  Headaches, PSYCHIATRIC DISORDERS Anxiety  Neuromuscular disease    GI/Hepatic GERD  Controlled,  Endo/Other  Hypothyroidism   Renal/GU Renal disease     Musculoskeletal   Abdominal   Peds  Hematology   Anesthesia Other Findings Past Medical History: No date: Anginal pain ([place]) No date: Anxiety No date: Chest pain No date: CHF (congestive heart failure) ([facility]) No date: Chicken pox No date: Complication of anesthesia     Comment:  o2 dropped after neck fusion No date: COPD (chronic obstructive pulmonary disease) ([facility]) No date: Coronary artery disease No date: Cough     Comment:  chronic  clear phlegm No date: Dysrhythmia     Comment:  palpitations No date: GERD (gastroesophageal reflux disease)     Comment:  h/o reflux/ hoarsness No date: Hematochezia No date: Hemorrhoids No date: History of chickenpox No date: History of colon polyps No date: History of Helicobacter pylori infection No date: Hoarseness No date: Hypertension 05/2016: Lung cancer ([place])     Comment:  Chemo + rad tx's.  No date: Migraines No date: OSA (obstructive sleep apnea)     Comment:  has CPAP but does not use 03/05/2016: Personal history of tobacco use, presenting hazards to  health No date:  Pneumonia     Comment:  5/17 No date: Raynaud disease No date: Raynaud disease No date: Raynaud's disease No date: Rotator cuff tear     Comment:  on right No date: Shortness of breath dyspnea No date: Sleep apnea No date: Ulcer (traumatic) of oral mucosa  Past Surgical History: No date: BACK SURGERY     Comment:  cervical fusion x 2 No date: CARDIAC CATHETERIZATION No date: CERVICAL DISCECTOMY     Comment:  x 2 No date: COLONOSCOPY 07/25/2015: COLONOSCOPY; N/A     Comment:  Procedure: COLONOSCOPY;  Surgeon: Lollie Sails, MD;              Location[facility] ENDOSCOPY;  Service: Endoscopy;                Laterality: N/A; 10/04/2017: COLONOSCOPY WITH PROPOFOL; N/A     Comment:  Procedure: COLONOSCOPY WITH PROPOFOL;  Surgeon:               Lollie Sails, MD;  Location: [facility] ENDOSCOPY;                Service: Endoscopy;  Laterality: N/A; 06/28/2016: ELECTROMAGNETIC NAVIGATION BROCHOSCOPY; Left     Comment:  Procedure: ELECTROMAGNETIC NAVIGATION BRONCHOSCOPY;                Surgeon: Vilinda Boehringer, MD;  Location: [facility] ORS;                Service: Cardiopulmonary;  Laterality: Left; 04/11/2018: ENDOBRONCHIAL ULTRASOUND; N/A     Comment:  Procedure: ENDOBRONCHIAL ULTRASOUND;  Surgeon: [name], MD;  Location: [facility] ORS;  Service:               Cardiopulmonary;  Laterality: N/A; 07/25/2015: ESOPHAGOGASTRODUODENOSCOPY; N/A     Comment:  Procedure: ESOPHAGOGASTRODUODENOSCOPY (EGD);  Surgeon:               Lollie Sails, MD;  Location: [facility] ENDOSCOPY;                Service: Endoscopy;  Laterality: N/A; No date: NASAL SINUS SURGERY     Comment:  x 2  04/24/2018: PORTA CATH INSERTION; N/A     Comment:  Procedure: PORTA CATH INSERTION;  Surgeon: Algernon Huxley,              MD;  Location: [facility] CV LAB;  Service:               Cardiovascular;  Laterality: N/A; No date: rotator cuff surgery; Right     Comment:  07/2016 No date: SEPTOPLASTY No date: SKIN  GRAFT     Reproductive/Obstetrics                           Anesthesia Physical Anesthesia Plan  ASA: III  Anesthesia Plan: General   Post-op Pain Management:    Induction: Intravenous  PONV Risk Score and Plan: Treatment may vary due to age or medical condition and TIVA  Airway Management Planned: Nasal Cannula and Natural Airway  Additional Equipment:   Intra-op Plan:   Post-operative Plan:   Informed Consent: I have reviewed the patients History and Physical, chart, labs and discussed the procedure including the risks, benefits and alternatives for the proposed anesthesia with the patient or authorized representative who has indicated his/her understanding and acceptance.     Dental Advisory Given  Plan Discussed with: CRNA  Anesthesia Plan Comments:         Anesthesia Quick Evaluation

## 2020-07-11 ENCOUNTER — Encounter: Payer: Self-pay | Admitting: Internal Medicine

## 2020-07-14 LAB — SURGICAL PATHOLOGY

## 2020-07-16 NOTE — Progress Notes (Signed)
07/17/2020  8:17 AM   Paul Carpenter Oct 08, 1956 [id number]  Referring provider: Kirk Ruths, MD [facility] [place],  [place] [address]  Chief Complaint  Patient presents with  . Benign Prostatic Hypertrophy    HPI: Paul Carpenter is a 64 y.o.  male with Stage III lung cancer, erectile dysfunction and BPH with LUTS who presents today for yearly follow up.   Erectile dysfunction His SHIM score is 16, which is mild to moderate ED.   Previous SHIM:  14.  He has been having difficulty with erections for several years.  His libido is diminshed.   His risk factors for ED are age, BPH, testosterone deficiency, HTN and CAD.  He denies any painful erections or curvatures with his erections.  He is no longer having spontaneous erections.  He is having painful ejaculations and a feeling that the fluid gets hung up in the urethra.  He has not had any hematospermia.    He is also complaining of lack of energy.  He continues to smoke in spite of his diagnosis of lung cancer and does not use his oxygen.     SHIM    Row Name 07/17/20 1110         SHIM: Over the last 6 months:   How do you rate your confidence that you could get and keep an erection? Low     When you had erections with sexual stimulation, how often were your erections hard enough for penetration (entering your partner)? Sometimes (about half the time)     During sexual intercourse, how often were you able to maintain your erection after you had penetrated (entered) your partner? Sometimes (about half the time)     During sexual intercourse, how difficult was it to maintain your erection to completion of intercourse? Slightly Difficult     When you attempted sexual intercourse, how often was it satisfactory for you? Most Times (much more than half the time)       SHIM Total Score   SHIM 16            Score: 1-7 Severe ED 8-11 Moderate ED 12-16 Mild-Moderate ED 17-21 Mild ED 22-25  No ED   BPH WITH LUTS  (prostate and/or bladder) IPSS score: 20/3     Previous score: 16/2  Previous PVR: 65 mL  Major complaint(s):   Frequency, urgency and a weak urinary stream.  He is currently on Myrbetriq 50 mg, but still having severe urgency, frequency x 5, nocturia x 3-4 and a weak urinary stream.  He stopped the Myrbetriq for one month and restarted the medication and the only improvement was an increase in the strength of his urinary stream.   He mainly drinks coffee, tea and tea and very little water.    Patient denies any modifying or aggravating factors.  Patient denies any gross hematuria, dysuria or suprapubic/flank pain.  Patient denies any fevers, chills, nausea or vomiting.    IPSS    Row Name 07/17/20 1100         International Prostate Symptom Score   How often have you had the sensation of not emptying your bladder? About half the time     How often have you had to urinate less than every two hours? About half the time     How often have you found you stopped and started again several times when you urinated? About half the time  How often have you found it difficult to postpone urination? More than half the time     How often have you had a weak urinary stream? About half the time     How often have you had to strain to start urination? Less than 1 in 5 times     How many times did you typically get up at night to urinate? 3 Times     Total IPSS Score 20       Quality of Life due to urinary symptoms   If you were to spend the rest of your life with your urinary condition just the way it is now how would you feel about that? Mixed            Score:  1-7 Mild 8-19 Moderate 20-35 Severe  PMH: Past Medical History:  Diagnosis Date  . Anginal pain ([place])   . Anxiety   . Chest pain   . CHF (congestive heart failure) ([place])   . Chicken pox   . Complication of anesthesia    o2 dropped after neck fusion  . COPD (chronic obstructive pulmonary disease)  ([place])   . Coronary artery disease   . Cough    chronic  clear phlegm  . Dysrhythmia    palpitations  . GERD (gastroesophageal reflux disease)    h/o reflux/ hoarsness  . Hematochezia   . Hemorrhoids   . History of chickenpox   . History of colon polyps   . History of Helicobacter pylori infection   . Hoarseness   . Hypertension   . Lung cancer ([place]) 05/2016   Chemo + rad tx's.   . Migraines   . OSA (obstructive sleep apnea)    has CPAP but does not use  . Personal history of tobacco use, presenting hazards to health 03/05/2016  . Pneumonia    5/17  . Raynaud disease   . Raynaud disease   . Raynaud's disease   . Rotator cuff tear    on right  . Shortness of breath dyspnea   . Sleep apnea   . Ulcer (traumatic) of oral mucosa     Surgical History: Past Surgical History:  Procedure Laterality Date  . BACK SURGERY     cervical fusion x 2  . CARDIAC CATHETERIZATION    . CERVICAL DISCECTOMY     x 2  . COLONOSCOPY    . COLONOSCOPY N/A 07/25/2015   Procedure: COLONOSCOPY;  Surgeon: Lollie Sails, MD;  Location: [facility] ENDOSCOPY;  Service: Endoscopy;  Laterality: N/A;  . COLONOSCOPY WITH PROPOFOL N/A 10/04/2017   Procedure: COLONOSCOPY WITH PROPOFOL;  Surgeon: Lollie Sails, MD;  Location: [facility] ENDOSCOPY;  Service: Endoscopy;  Laterality: N/A;  . COLONOSCOPY WITH PROPOFOL N/A 07/10/2020   Procedure: COLONOSCOPY WITH PROPOFOL;  Surgeon: Toledo, Benay Pike, MD;  Location: [facility] ENDOSCOPY;  Service: Gastroenterology;  Laterality: N/A;  . ELECTROMAGNETIC NAVIGATION BROCHOSCOPY Left 06/28/2016   Procedure: ELECTROMAGNETIC NAVIGATION BRONCHOSCOPY;  Surgeon: Vilinda Boehringer, MD;  Location: [facility] ORS;  Service: Cardiopulmonary;  Laterality: Left;  . ENDOBRONCHIAL ULTRASOUND N/A 04/11/2018   Procedure: ENDOBRONCHIAL ULTRASOUND;  Surgeon: [name], MD;  Location: [facility] ORS;  Service: Cardiopulmonary;  Laterality: N/A;  . ESOPHAGOGASTRODUODENOSCOPY N/A 07/25/2015   Procedure:  ESOPHAGOGASTRODUODENOSCOPY (EGD);  Surgeon: Lollie Sails, MD;  Location: [facility] ENDOSCOPY;  Service: Endoscopy;  Laterality: N/A;  . ESOPHAGOGASTRODUODENOSCOPY (EGD) WITH PROPOFOL N/A 07/10/2020   Procedure: ESOPHAGOGASTRODUODENOSCOPY (EGD) WITH PROPOFOL;  Surgeon: Toledo, Benay Pike, MD;  Location: [facility] ENDOSCOPY;  Service: Gastroenterology;  Laterality: N/A;  . NASAL SINUS SURGERY     x 2   . PORTA CATH INSERTION N/A 04/24/2018   Procedure: PORTA CATH INSERTION;  Surgeon: Algernon Huxley, MD;  Location: [place] CV LAB;  Service: Cardiovascular;  Laterality: N/A;  . rotator cuff surgery Right    07/2016  . SEPTOPLASTY    . SKIN GRAFT      Home Medications:  Allergies as of 07/17/2020      Reactions   Lisinopril Rash   Varenicline Rash      Medication List       Accurate as of July 17, 2020 11:59 PM. If you have any questions, ask your nurse or doctor.        STOP taking these medications   levofloxacin 500 MG tablet Commonly known as: Levaquin Stopped by: Lorren Rossetti, PA-C     TAKE these medications   acetaminophen 500 MG tablet Commonly known as: TYLENOL Take 1,000 mg by mouth every 6 (six) hours as needed (for pain.).   albuterol 108 (90 Base) MCG/ACT inhaler Commonly known as: ProAir HFA Inhale 2 puffs into the lungs every 4 (four) hours as needed for wheezing or shortness of breath.   Anoro Ellipta 62.5-25 MCG/INH Aepb Generic drug: umeclidinium-vilanterol Inhale 1 puff into the lungs daily.   atorvastatin 40 MG tablet Commonly known as: LIPITOR Take 40 mg by mouth at bedtime.   busPIRone 15 MG tablet Commonly known as: BUSPAR Take 15 mg by mouth 2 (two) times daily.   diazepam 5 MG tablet Commonly known as: VALIUM Take 2 tablets (10 mg total) by mouth every 12 (twelve) hours as needed for anxiety. What changed:   how much to take  when to take this   DULoxetine 60 MG capsule Commonly known as: CYMBALTA Take 1 capsule by mouth 1 day or 1  dose.   fluticasone 50 MCG/ACT nasal spray Commonly known as: FLONASE Place 1 spray into both nostrils daily as needed (for allergies.).   ipratropium-albuterol 0.5-2.5 (3) MG/3ML Soln Commonly known as: DUONEB Take 3 mLs by nebulization every 6 (six) hours.   levothyroxine 150 MCG tablet Commonly known as: SYNTHROID Take 150 mcg by mouth daily before breakfast. What changed: Another medication with the same name was changed. Make sure you understand how and when to take each.   levothyroxine 25 MCG tablet Commonly known as: SYNTHROID TAKE 1 TABLET (25 MCG TOTAL) BY MOUTH DAILY BEFORE BREAKFAST. What changed: how much to take   lidocaine 2 % solution Commonly known as: XYLOCAINE   lidocaine-prilocaine cream Commonly known as: EMLA Apply 1 application topically as needed.   metoprolol succinate 25 MG 24 hr tablet Commonly known as: TOPROL-XL Take 25 mg by mouth daily.   multivitamin with minerals Tabs tablet Take 1 tablet by mouth daily.   Myrbetriq 50 MG Tb24 tablet Generic drug: mirabegron ER Take 1 tablet (50 mg total) by mouth daily.   nitroGLYCERIN 0.4 MG SL tablet Commonly known as: NITROSTAT Place 0.4 mg under the tongue every 5 (five) minutes as needed for chest pain.   ondansetron 8 MG tablet Commonly known as: ZOFRAN Take 8 mg by mouth every 8 (eight) hours as needed for nausea or vomiting.   OXYGEN Inhale 2 L into the lungs at bedtime.   pantoprazole 40 MG tablet Commonly known as: PROTONIX Take 40 mg by mouth daily.   potassium chloride 10 MEQ tablet Commonly known as: KLOR-CON TAKE 1 TABLET (10  MEQ TOTAL) BY MOUTH ONCE DAILY.   propranolol 80 MG 24 hr capsule Commonly known as: INNOPRAN XL Take by mouth.   Spiriva Respimat 2.5 MCG/ACT Aers Generic drug: Tiotropium Bromide Monohydrate Inhale into the lungs daily. 2 puffs   Super Vikaps Tabs Take by mouth.       Allergies:  Allergies  Allergen Reactions  . Lisinopril Rash  .  Varenicline Rash    Family History: Family History  Problem Relation Age of Onset  . Heart disease Father   . Prostate cancer Father   . Heart disease Paternal Grandmother   . Heart attack Maternal Grandfather 52  . Kidney cancer Neg Hx   . Bladder Cancer Neg Hx   . Other Neg Hx        pituitary abnormality    Social History:  reports that he has been smoking cigarettes. He has a 90.00 pack-year smoking history. He has never used smokeless tobacco. He reports current alcohol use of about 2.0 standard drinks of alcohol per week. He reports that he does not use drugs.  ROS: For pertinent review of systems please refer to history of present illness  Physical Exam: BP 124/78   Pulse 97   Constitutional:  Well nourished. Alert and oriented, No acute distress. HEENT: [employer] AT, mask in place.  Trachea midline Cardiovascular: No clubbing, cyanosis, or edema. Respiratory: Normal respiratory effort, no increased work of breathing. GU: No CVA tenderness.  No bladder fullness or masses.  Patient with circumcised phallus.  Urethral meatus is patent.  No penile discharge. No penile lesions or rashes. Scrotum without lesions, cysts, rashes and/or edema.  Testicles are located scrotally bilaterally. No masses are appreciated in the testicles. Left and right epididymis are normal. Rectal: Patient with  normal sphincter tone. Anus and perineum without scarring or rashes. No rectal masses are appreciated. Prostate is approximately 45 grams, no nodules are appreciated. Seminal vesicles could not be palpated Skin: No rashes, bruises or suspicious lesions. Lymph: No inguinal adenopathy. Neurologic: Grossly intact, no focal deficits, moving all 4 extremities. Psychiatric: Normal mood and affect.  Laboratory Data: Urinalysis Component     Latest Ref Rng & Units 07/17/2020  Specific Gravity, UA     1.005 - 1.030 1.010  pH, UA     5.0 - 7.5 6.0  Color, UA     Yellow Yellow  Appearance Ur     Clear  Clear  Leukocytes,UA     Negative Negative  Protein,UA     Negative/Trace Negative  Glucose, UA     Negative Negative  Ketones, UA     Negative Negative  RBC, UA     Negative Negative  Bilirubin, UA     Negative Negative  Urobilinogen, Ur     0.2 - 1.0 mg/dL 0.2  Nitrite, UA     Negative Negative  Microscopic Examination      See below:   Component     Latest Ref Rng & Units 07/17/2020  WBC, UA     0 - 5 /hpf 0-5  RBC     0 - 2 /hpf 0-2  Epithelial Cells (non renal)     0 - 10 /hpf 0-10  Bacteria, UA     None seen/Few None seen   Lab Results  Component Value Date   WBC 7.6 04/24/2020   HGB 15.1 04/24/2020   HCT 41.9 04/24/2020   MCV 95.4 04/24/2020   PLT 239 04/24/2020   Lab Results  Component Value  Date   CREATININE 0.79 04/24/2020   Lab Results  Component Value Date   TESTOSTERONE 324 07/22/2020   Assessment & Plan:    1. BPH with LUTS IPSS score is 20/3, it is worsening Continue conservative management, avoiding bladder irritants and timed voiding's Most bothersome symptoms is/are urgency  No major improvement with OAB medications, urgency may be the result of his consumption of caffeinated products and limited water intake, but due to the discomfort with ejaculation and his tobacco dependence I recommended he undergo cystoscopy to rule out any malignancy in the bladder as a causative agent to his symptoms I have explained to the patient that they will  be scheduled for a cystoscopy in our office to evaluate their bladder.  The cystoscopy consists of passing a tube with a lens up through their urethra and into their urinary bladder.   We will inject the urethra with a lidocaine gel prior to introducing the cystoscope to help with any discomfort during the procedure.   After the procedure, they might experience blood in the urine and discomfort with urination.  This will abate after the first few voids.  I have  encouraged the patient to increase water intake   during this time.  Patient denies any allergies to lidocaine.  RTC for cystoscopy for refractory urgency  2. Erectile dysfunction SHIM score is 16, it is improved Testosterone pending  RTC in 12 months for SHIM and exam  3. Tobacco dependence Encouraged patient to consider quitting as smoking can contribute to ED and his lack of energy.    Return for return for morning testosterone draw and cystoscopy for refractory urgency .  Zara Council, PA-C [facility] [address] [place], [place] [address] [phone]

## 2020-07-17 ENCOUNTER — Other Ambulatory Visit: Payer: Self-pay

## 2020-07-17 ENCOUNTER — Ambulatory Visit: Payer: BC Managed Care – PPO | Admitting: Urology

## 2020-07-17 VITALS — BP 124/78 | HR 97

## 2020-07-17 DIAGNOSIS — N529 Male erectile dysfunction, unspecified: Secondary | ICD-10-CM

## 2020-07-17 DIAGNOSIS — N138 Other obstructive and reflux uropathy: Secondary | ICD-10-CM | POA: Diagnosis not present

## 2020-07-17 DIAGNOSIS — N401 Enlarged prostate with lower urinary tract symptoms: Secondary | ICD-10-CM

## 2020-07-17 DIAGNOSIS — E349 Endocrine disorder, unspecified: Secondary | ICD-10-CM

## 2020-07-17 NOTE — Patient Instructions (Signed)
Cystoscopy Cystoscopy is a procedure that is used to help diagnose and sometimes treat conditions that affect the lower urinary tract. The lower urinary tract includes the bladder and the urethra. The urethra is the tube that drains urine from the bladder. Cystoscopy is done using a thin, tube-shaped instrument with a light and camera at the end (cystoscope). The cystoscope may be hard or flexible, depending on the goal of the procedure. The cystoscope is inserted through the urethra, into the bladder. Cystoscopy may be recommended if you have:  Urinary tract infections that keep coming back.  Blood in the urine (hematuria).  An inability to control when you urinate (urinary incontinence) or an overactive bladder.  Unusual cells found in a urine sample.  A blockage in the urethra, such as a urinary stone.  Painful urination.  An abnormality in the bladder found during an intravenous pyelogram (IVP) or CT scan. Cystoscopy may also be done to remove a sample of tissue to be examined under a microscope (biopsy). What are the risks? Generally, this is a safe procedure. However, problems may occur, including:  Infection.  Bleeding.  What happens during the procedure?  1. You will be given one or more of the following: ? A medicine to numb the area (local anesthetic). 2. The area around the opening of your urethra will be cleaned. 3. The cystoscope will be passed through your urethra into your bladder. 4. Germ-free (sterile) fluid will flow through the cystoscope to fill your bladder. The fluid will stretch your bladder so that your health care provider can clearly examine your bladder walls. 5. Your doctor will look at the urethra and bladder. 6. The cystoscope will be removed The procedure may vary among health care providers  What can I expect after the procedure? After the procedure, it is common to have: 1. Some soreness or pain in your abdomen and urethra. 2. Urinary symptoms.  These include: ? Mild pain or burning when you urinate. Pain should stop within a few minutes after you urinate. This may last for up to 1 week. ? A small amount of blood in your urine for several days. ? Feeling like you need to urinate but producing only a small amount of urine. Follow these instructions at home: General instructions  Return to your normal activities as told by your health care provider.   Do not drive for 24 hours if you were given a sedative during your procedure.  Watch for any blood in your urine. If the amount of blood in your urine increases, call your health care provider.  If a tissue sample was removed for testing (biopsy) during your procedure, it is up to you to get your test results. Ask your health care provider, or the department that is doing the test, when your results will be ready.  Drink enough fluid to keep your urine pale yellow.  Keep all follow-up visits as told by your health care provider. This is important. Contact a health care provider if you:  Have pain that gets worse or does not get better with medicine, especially pain when you urinate.  Have trouble urinating.  Have more blood in your urine. Get help right away if you:  Have blood clots in your urine.  Have abdominal pain.  Have a fever or chills.  Are unable to urinate. Summary  Cystoscopy is a procedure that is used to help diagnose and sometimes treat conditions that affect the lower urinary tract.  Cystoscopy is done using   a thin, tube-shaped instrument with a light and camera at the end.  After the procedure, it is common to have some soreness or pain in your abdomen and urethra.  Watch for any blood in your urine. If the amount of blood in your urine increases, call your health care provider.  If you were prescribed an antibiotic medicine, take it as told by your health care provider. Do not stop taking the antibiotic even if you start to feel better. This  information is not intended to replace advice given to you by your health care provider. Make sure you discuss any questions you have with your health care provider. Document Revised: 10/10/2018 Document Reviewed: 10/10/2018 Elsevier Patient Education  2020 Elsevier Inc.   

## 2020-07-18 LAB — URINALYSIS, COMPLETE
Bilirubin, UA: NEGATIVE
Glucose, UA: NEGATIVE
Ketones, UA: NEGATIVE
Leukocytes,UA: NEGATIVE
Nitrite, UA: NEGATIVE
Protein,UA: NEGATIVE
RBC, UA: NEGATIVE
Specific Gravity, UA: 1.01 (ref 1.005–1.030)
Urobilinogen, Ur: 0.2 mg/dL (ref 0.2–1.0)
pH, UA: 6 (ref 5.0–7.5)

## 2020-07-18 LAB — MICROSCOPIC EXAMINATION: Bacteria, UA: NONE SEEN

## 2020-07-18 LAB — PSA: Prostate Specific Ag, Serum: 0.8 ng/mL (ref 0.0–4.0)

## 2020-07-22 ENCOUNTER — Encounter: Payer: Self-pay | Admitting: *Deleted

## 2020-07-22 ENCOUNTER — Other Ambulatory Visit: Payer: Self-pay | Admitting: *Deleted

## 2020-07-22 ENCOUNTER — Other Ambulatory Visit: Payer: Self-pay

## 2020-07-22 ENCOUNTER — Other Ambulatory Visit: Payer: BC Managed Care – PPO

## 2020-07-22 DIAGNOSIS — E349 Endocrine disorder, unspecified: Secondary | ICD-10-CM

## 2020-07-23 ENCOUNTER — Encounter: Payer: Self-pay | Admitting: Urology

## 2020-07-23 ENCOUNTER — Telehealth: Payer: Self-pay | Admitting: Family Medicine

## 2020-07-23 LAB — TESTOSTERONE: Testosterone: 324 ng/dL (ref 264–916)

## 2020-07-23 NOTE — Telephone Encounter (Signed)
Patient notified and voiced understanding. Patient states he will call and reschedule Cysto after he gets some of his other appointments taken care of at other practices.

## 2020-07-23 NOTE — Telephone Encounter (Signed)
-----   Message from Nori Riis, PA-C sent at 07/23/2020  8:08 AM EDT ----- Please let Paul Carpenter know that his testosterone level returned above 300 ng/dL, he does not meet the criteria for testosterone deficiency and therefore cannot start TRT.   He also needs to be scheduled for a cystoscopy for refractory urgency.

## 2020-07-24 ENCOUNTER — Other Ambulatory Visit: Payer: Self-pay | Admitting: Urology

## 2020-07-29 ENCOUNTER — Telehealth: Payer: Self-pay | Admitting: Urology

## 2020-07-29 NOTE — Telephone Encounter (Signed)
Spoke to patient and explained what a Cysto is. Patient voiced understanding and rescheduled the procedure

## 2020-07-29 NOTE — Telephone Encounter (Signed)
Pt returned your call and would like a call back.  He is asking clinical questions about cysto.

## 2020-08-08 ENCOUNTER — Other Ambulatory Visit: Payer: Self-pay | Admitting: Urology

## 2020-08-14 ENCOUNTER — Other Ambulatory Visit: Payer: Self-pay

## 2020-08-14 ENCOUNTER — Inpatient Hospital Stay: Payer: BC Managed Care – PPO | Attending: Oncology

## 2020-08-14 ENCOUNTER — Telehealth: Payer: Self-pay

## 2020-08-14 ENCOUNTER — Other Ambulatory Visit: Payer: Self-pay | Admitting: *Deleted

## 2020-08-14 DIAGNOSIS — I509 Heart failure, unspecified: Secondary | ICD-10-CM | POA: Insufficient documentation

## 2020-08-14 DIAGNOSIS — K219 Gastro-esophageal reflux disease without esophagitis: Secondary | ICD-10-CM | POA: Diagnosis not present

## 2020-08-14 DIAGNOSIS — C3491 Malignant neoplasm of unspecified part of right bronchus or lung: Secondary | ICD-10-CM

## 2020-08-14 DIAGNOSIS — E871 Hypo-osmolality and hyponatremia: Secondary | ICD-10-CM | POA: Diagnosis not present

## 2020-08-14 DIAGNOSIS — J449 Chronic obstructive pulmonary disease, unspecified: Secondary | ICD-10-CM | POA: Insufficient documentation

## 2020-08-14 DIAGNOSIS — Z95828 Presence of other vascular implants and grafts: Secondary | ICD-10-CM

## 2020-08-14 DIAGNOSIS — Z923 Personal history of irradiation: Secondary | ICD-10-CM | POA: Diagnosis not present

## 2020-08-14 DIAGNOSIS — Z79899 Other long term (current) drug therapy: Secondary | ICD-10-CM | POA: Insufficient documentation

## 2020-08-14 DIAGNOSIS — G4733 Obstructive sleep apnea (adult) (pediatric): Secondary | ICD-10-CM | POA: Insufficient documentation

## 2020-08-14 DIAGNOSIS — C342 Malignant neoplasm of middle lobe, bronchus or lung: Secondary | ICD-10-CM | POA: Insufficient documentation

## 2020-08-14 DIAGNOSIS — Z452 Encounter for adjustment and management of vascular access device: Secondary | ICD-10-CM | POA: Diagnosis present

## 2020-08-14 DIAGNOSIS — I2510 Atherosclerotic heart disease of native coronary artery without angina pectoris: Secondary | ICD-10-CM | POA: Insufficient documentation

## 2020-08-14 DIAGNOSIS — F419 Anxiety disorder, unspecified: Secondary | ICD-10-CM | POA: Insufficient documentation

## 2020-08-14 DIAGNOSIS — F17210 Nicotine dependence, cigarettes, uncomplicated: Secondary | ICD-10-CM | POA: Insufficient documentation

## 2020-08-14 DIAGNOSIS — I110 Hypertensive heart disease with heart failure: Secondary | ICD-10-CM | POA: Diagnosis not present

## 2020-08-14 DIAGNOSIS — E039 Hypothyroidism, unspecified: Secondary | ICD-10-CM | POA: Insufficient documentation

## 2020-08-14 LAB — CBC WITH DIFFERENTIAL/PLATELET
Abs Immature Granulocytes: 0.03 10*3/uL (ref 0.00–0.07)
Basophils Absolute: 0.1 10*3/uL (ref 0.0–0.1)
Basophils Relative: 1 %
Eosinophils Absolute: 0.2 10*3/uL (ref 0.0–0.5)
Eosinophils Relative: 2 %
HCT: 41.7 % (ref 39.0–52.0)
Hemoglobin: 15 g/dL (ref 13.0–17.0)
Immature Granulocytes: 0 %
Lymphocytes Relative: 13 %
Lymphs Abs: 1.3 10*3/uL (ref 0.7–4.0)
MCH: 34.2 pg — ABNORMAL HIGH (ref 26.0–34.0)
MCHC: 36 g/dL (ref 30.0–36.0)
MCV: 95 fL (ref 80.0–100.0)
Monocytes Absolute: 0.6 10*3/uL (ref 0.1–1.0)
Monocytes Relative: 6 %
Neutro Abs: 8.1 10*3/uL — ABNORMAL HIGH (ref 1.7–7.7)
Neutrophils Relative %: 78 %
Platelets: 240 10*3/uL (ref 150–400)
RBC: 4.39 MIL/uL (ref 4.22–5.81)
RDW: 11.9 % (ref 11.5–15.5)
WBC: 10.3 10*3/uL (ref 4.0–10.5)
nRBC: 0 % (ref 0.0–0.2)

## 2020-08-14 LAB — COMPREHENSIVE METABOLIC PANEL
ALT: 15 U/L (ref 0–44)
AST: 19 U/L (ref 15–41)
Albumin: 4.1 g/dL (ref 3.5–5.0)
Alkaline Phosphatase: 79 U/L (ref 38–126)
Anion gap: 7 (ref 5–15)
BUN: 12 mg/dL (ref 8–23)
CO2: 26 mmol/L (ref 22–32)
Calcium: 8.5 mg/dL — ABNORMAL LOW (ref 8.9–10.3)
Chloride: 97 mmol/L — ABNORMAL LOW (ref 98–111)
Creatinine, Ser: 1.01 mg/dL (ref 0.61–1.24)
GFR, Estimated: >60 mL/min (ref >60)
Glucose, Bld: 146 mg/dL — ABNORMAL HIGH (ref 70–99)
Potassium: 3.7 mmol/L (ref 3.5–5.1)
Sodium: 130 mmol/L — ABNORMAL LOW (ref 135–145)
Total Bilirubin: 0.6 mg/dL (ref 0.3–1.2)
Total Protein: 6.7 g/dL (ref 6.5–8.1)

## 2020-08-14 MED ORDER — HEPARIN SOD (PORK) LOCK FLUSH 100 UNIT/ML IV SOLN
500.0000 [IU] | Freq: Once | INTRAVENOUS | Status: AC
  Administered 2020-08-14: 500 [IU] via INTRAVENOUS
  Filled 2020-08-14: qty 5

## 2020-08-14 MED ORDER — SODIUM CHLORIDE 0.9% FLUSH
10.0000 mL | Freq: Once | INTRAVENOUS | Status: AC
  Administered 2020-08-14: 10 mL via INTRAVENOUS
  Filled 2020-08-14: qty 10

## 2020-08-14 NOTE — Telephone Encounter (Signed)
Called pt answered all questions in regards to upcoming cysto. Pt gave verbal understanding.

## 2020-08-15 ENCOUNTER — Encounter: Payer: Self-pay | Admitting: Urology

## 2020-08-15 ENCOUNTER — Ambulatory Visit: Payer: BC Managed Care – PPO | Admitting: Urology

## 2020-08-15 VITALS — BP 145/81 | HR 99 | Ht 72.0 in | Wt 205.0 lb

## 2020-08-15 DIAGNOSIS — R3915 Urgency of urination: Secondary | ICD-10-CM

## 2020-08-15 DIAGNOSIS — N401 Enlarged prostate with lower urinary tract symptoms: Secondary | ICD-10-CM | POA: Diagnosis not present

## 2020-08-15 DIAGNOSIS — N138 Other obstructive and reflux uropathy: Secondary | ICD-10-CM | POA: Diagnosis not present

## 2020-08-15 LAB — URINALYSIS, COMPLETE
Bilirubin, UA: NEGATIVE
Glucose, UA: NEGATIVE
Ketones, UA: NEGATIVE
Leukocytes,UA: NEGATIVE
Nitrite, UA: NEGATIVE
Protein,UA: NEGATIVE
RBC, UA: NEGATIVE
Specific Gravity, UA: 1.015 (ref 1.005–1.030)
Urobilinogen, Ur: 0.2 mg/dL (ref 0.2–1.0)
pH, UA: 6 (ref 5.0–7.5)

## 2020-08-15 LAB — MICROSCOPIC EXAMINATION
Bacteria, UA: NONE SEEN
RBC, Urine: NONE SEEN /hpf (ref 0–2)

## 2020-08-15 NOTE — Progress Notes (Signed)
   08/15/20  CC:  Chief Complaint  Patient presents with  . Cysto   Indication: Urinary urgency  HPI: See Shannon's note 07/17/2020  Blood pressure (!) 145/81, pulse 99, height 6' (1.829 m), weight 205 lb (93 kg). NED. A&Ox3.   No respiratory distress   Abd soft, NT, ND Normal phallus with bilateral descended testicles  Cystoscopy Procedure Note  Patient identification was confirmed, informed consent was obtained, and patient was prepped using Betadine solution.  Lidocaine jelly was administered per urethral meatus.     Pre-Procedure: - Inspection reveals a normal caliber urethral meatus.  Procedure: The flexible cystoscope was introduced without difficulty - No urethral strictures/lesions are present. - Moderate lateral lobe enlargement prostate  - Mild elevation bladder neck - Bilateral ureteral orifices identified - Bladder mucosa  reveals no ulcers, tumors, or lesions - No bladder stones - No trabeculation  Retroflexion shows no abnormalities   Post-Procedure: - Patient tolerated the procedure well  Assessment/ Plan:  Moderate prostate enlargement  No bladder mucosal abnormalities  He did not desire any additional treatment for his lower urinary tract symptoms  Follow-up Zara Council in 1 year or as needed for any change in symptoms   Abbie Sons, MD

## 2020-08-15 NOTE — Progress Notes (Signed)
[place]  [phone] Fax:([phone]) [phone]  ID: Paul Carpenter OB: 10-06-1956  MR#: [id number]  JSH#:[id number]  Patient Care Team: Kirk Ruths, MD as PCP - General (Internal Medicine) Lucky Cowboy, Erskine Squibb, MD as Referring Physician (Vascular Surgery) [name], MD as Consulting Physician (Oncology) [name], MD as Referring Physician (Radiation Oncology)   CHIEF COMPLAINT: Stage IIIa non-small cell lung cancer, right middle lobe.  INTERVAL HISTORY: Patient returns to clinic today for further evaluation and discussion of his imaging results.  He currently feels well and is asymptomatic.  He continues to have chronic cough.  He continues to smoke.  He has no neurologic complaints.  He denies any recent fevers or illnesses.  He denies any chest pain, shortness of breath, or hemoptysis. He denies any nausea, vomiting, constipation, or diarrhea. He has no urinary complaints.  Patient offers no further specific complaints today.  REVIEW OF SYSTEMS:   Review of Systems  Constitutional: Negative.  Negative for fever, malaise/fatigue and weight loss.  HENT: Negative.  Negative for congestion and sore throat.   Respiratory: Positive for cough. Negative for hemoptysis and shortness of breath.   Cardiovascular: Negative.  Negative for chest pain and leg swelling.  Gastrointestinal: Negative.  Negative for abdominal pain, blood in stool and melena.  Genitourinary: Negative.  Negative for dysuria.  Musculoskeletal: Negative.  Negative for joint pain.  Skin: Negative.  Negative for rash.  Neurological: Negative.  Negative for dizziness, sensory change, focal weakness, weakness and headaches.  Psychiatric/Behavioral: Negative.  The patient is not nervous/anxious.     As per HPI. Otherwise, a complete review of systems is negative.  PAST MEDICAL HISTORY: Past Medical History:  Diagnosis Date  . Anginal pain ([place])   . Anxiety   . Asthma   .  Chest pain   . CHF (congestive heart failure) ([place])   . Chicken pox   . Complication of anesthesia    o2 dropped after neck fusion  . COPD (chronic obstructive pulmonary disease) ([place])   . Coronary artery disease   . Cough    chronic  clear phlegm  . Dysrhythmia    palpitations  . GERD (gastroesophageal reflux disease)    h/o reflux/ hoarsness  . Hematochezia   . Hemorrhoids   . History of chickenpox   . History of colon polyps   . History of Helicobacter pylori infection   . Hoarseness   . Hypertension   . Lung cancer ([place]) 05/2016   Chemo + rad tx's.   . Migraines   . OSA (obstructive sleep apnea)    has CPAP but does not use  . Personal history of tobacco use, presenting hazards to health 03/05/2016  . Pneumonia    5/17  . Raynaud disease   . Raynaud disease   . Raynaud's disease   . Rotator cuff tear    on right  . Shortness of breath dyspnea   . Sleep apnea   . Ulcer (traumatic) of oral mucosa     PAST SURGICAL HISTORY: Past Surgical History:  Procedure Laterality Date  . BACK SURGERY     cervical fusion x 2  . CARDIAC CATHETERIZATION    . CERVICAL DISCECTOMY     x 2  . COLONOSCOPY    . COLONOSCOPY N/A 07/25/2015   Procedure: COLONOSCOPY;  Surgeon: Lollie Sails, MD;  Location[facility] ENDOSCOPY;  Service: Endoscopy;  Laterality: N/A;  . COLONOSCOPY WITH PROPOFOL N/A 10/04/2017   Procedure: COLONOSCOPY WITH  PROPOFOL;  Surgeon: Lollie Sails, MD;  Location: [facility] ENDOSCOPY;  Service: Endoscopy;  Laterality: N/A;  . COLONOSCOPY WITH PROPOFOL N/A 07/10/2020   Procedure: COLONOSCOPY WITH PROPOFOL;  Surgeon: Toledo, Benay Pike, MD;  Location: [facility] ENDOSCOPY;  Service: Gastroenterology;  Laterality: N/A;  . ELECTROMAGNETIC NAVIGATION BROCHOSCOPY Left 06/28/2016   Procedure: ELECTROMAGNETIC NAVIGATION BRONCHOSCOPY;  Surgeon: Vilinda Boehringer, MD;  Location: [facility] ORS;  Service: Cardiopulmonary;  Laterality: Left;  . ENDOBRONCHIAL ULTRASOUND N/A 04/11/2018   Procedure:  ENDOBRONCHIAL ULTRASOUND;  Surgeon: [name], MD;  Location: [facility] ORS;  Service: Cardiopulmonary;  Laterality: N/A;  . ESOPHAGOGASTRODUODENOSCOPY N/A 07/25/2015   Procedure: ESOPHAGOGASTRODUODENOSCOPY (EGD);  Surgeon: Lollie Sails, MD;  Location: [facility] ENDOSCOPY;  Service: Endoscopy;  Laterality: N/A;  . ESOPHAGOGASTRODUODENOSCOPY (EGD) WITH PROPOFOL N/A 07/10/2020   Procedure: ESOPHAGOGASTRODUODENOSCOPY (EGD) WITH PROPOFOL;  Surgeon: Toledo, Benay Pike, MD;  Location: [facility] ENDOSCOPY;  Service: Gastroenterology;  Laterality: N/A;  . NASAL SINUS SURGERY     x 2   . PORTA CATH INSERTION N/A 04/24/2018   Procedure: PORTA CATH INSERTION;  Surgeon: Algernon Huxley, MD;  Location: [facility];  Service: Cardiovascular;  Laterality: N/A;  . rotator cuff surgery Right    07/2016  . SEPTOPLASTY    . SKIN GRAFT      FAMILY HISTORY Family History  Problem Relation Age of Onset  . Heart disease Father   . Prostate cancer Father   . Heart disease Paternal Grandmother   . Heart attack Maternal Grandfather 52  . Kidney cancer Neg Hx   . Bladder Cancer Neg Hx   . Other Neg Hx        pituitary abnormality       ADVANCED DIRECTIVES:    HEALTH MAINTENANCE: Social History   Tobacco Use  . Smoking status: Current Every Day Smoker    Packs/day: 2.00    Years: 45.00    Pack years: 90.00    Types: Cigarettes  . Smokeless tobacco: Never Used  Vaping Use  . Vaping Use: Never used  Substance Use Topics  . Alcohol use: Yes    Alcohol/week: 2.0 standard drinks    Types: 2 Standard drinks or equivalent per week    Comment: moderate  . Drug use: No     Allergies  Allergen Reactions  . Lisinopril Rash  . Varenicline Rash    Current Outpatient Medications  Medication Sig Dispense Refill  . acetaminophen (TYLENOL) 500 MG tablet Take 1,000 mg by mouth every 6 (six) hours as needed (for pain.).    Marland Kitchen albuterol (PROAIR HFA) 108 (90 Base) MCG/ACT inhaler Inhale 2 puffs into the lungs  every 4 (four) hours as needed for wheezing or shortness of breath. 1 Inhaler 1  . atorvastatin (LIPITOR) 40 MG tablet Take 40 mg by mouth at bedtime.     . busPIRone (BUSPAR) 15 MG tablet Take 15 mg by mouth 2 (two) times daily.   10  . diazepam (VALIUM) 5 MG tablet Take 2 tablets (10 mg total) by mouth every 12 (twelve) hours as needed for anxiety. (Patient taking differently: Take 5 mg by mouth every 8 (eight) hours as needed for anxiety. ) 60 tablet 0  . DULoxetine (CYMBALTA) 60 MG capsule Take 1 capsule by mouth 1 day or 1 dose.    . fluticasone (FLONASE) 50 MCG/ACT nasal spray Place 1 spray into both nostrils daily as needed (for allergies.).     Marland Kitchen ipratropium-albuterol (DUONEB) 0.5-2.5 (3) MG/3ML SOLN Take 3  mLs by nebulization every 6 (six) hours. 360 mL 5  . levothyroxine (SYNTHROID) 150 MCG tablet Take 150 mcg by mouth daily before breakfast.    . levothyroxine (SYNTHROID) 25 MCG tablet TAKE 1 TABLET (25 MCG TOTAL) BY MOUTH DAILY BEFORE BREAKFAST. (Patient taking differently: Take 150 mcg by mouth daily before breakfast. ) 30 tablet 1  . lidocaine (XYLOCAINE) 2 % solution   2  . lidocaine-prilocaine (EMLA) cream Apply 1 application topically as needed. 30 g 1  . metoprolol succinate (TOPROL-XL) 25 MG 24 hr tablet Take 25 mg by mouth daily.    . Multiple Vitamin (MULTIVITAMIN WITH MINERALS) TABS tablet Take 1 tablet by mouth daily.     . Multiple Vitamins-Minerals (SUPER VIKAPS) TABS Take by mouth.    Marland Kitchen MYRBETRIQ 50 MG TB24 tablet TAKE 1 TABLET BY MOUTH EVERY DAY 30 tablet 0  . nitroGLYCERIN (NITROSTAT) 0.4 MG SL tablet Place 0.4 mg under the tongue every 5 (five) minutes as needed for chest pain.     Marland Kitchen ondansetron (ZOFRAN) 8 MG tablet Take 8 mg by mouth every 8 (eight) hours as needed for nausea or vomiting.    . OXYGEN Inhale 2 L into the lungs at bedtime.     . pantoprazole (PROTONIX) 40 MG tablet Take 40 mg by mouth daily.  3  . potassium chloride (K-DUR) 10 MEQ tablet TAKE 1 TABLET  (10 MEQ TOTAL) BY MOUTH ONCE DAILY.  3  . propranolol (INNOPRAN XL) 80 MG 24 hr capsule Take by mouth.    . Tiotropium Bromide Monohydrate (SPIRIVA RESPIMAT) 2.5 MCG/ACT AERS Inhale into the lungs daily. 2 puffs    . umeclidinium-vilanterol (ANORO ELLIPTA) 62.5-25 MCG/INH AEPB Inhale 1 puff into the lungs daily. 1 each 1   No current facility-administered medications for this visit.   Facility-Administered Medications Ordered in Other Visits  Medication Dose Route Frequency Provider Last Rate Last Admin  . heparin lock flush 100 unit/mL  500 Units Intravenous Once [name], MD        OBJECTIVE: Vitals:   08/20/20 1028  BP: (!) 141/81  Pulse: 88  Resp: 20  Temp: (!) 96.2 F (35.7 C)     Body mass index is 28.48 kg/m.    ECOG FS:0 - Asymptomatic  General: Well-developed, well-nourished, no acute distress. Eyes: Pink conjunctiva, anicteric sclera. HEENT: Normocephalic, moist mucous membranes. Lungs: No audible wheezing or coughing. Heart: Regular rate and rhythm. Abdomen: Soft, nontender, no obvious distention. Musculoskeletal: No edema, cyanosis, or clubbing. Neuro: Alert, answering all questions appropriately. Cranial nerves grossly intact. Skin: No rashes or petechiae noted. Psych: Normal affect.  LAB RESULTS:  Lab Results  Component Value Date   NA 130 (L) 08/14/2020   K 3.7 08/14/2020   CL 97 (L) 08/14/2020   CO2 26 08/14/2020   GLUCOSE 146 (H) 08/14/2020   BUN 12 08/14/2020   CREATININE 1.01 08/14/2020   CALCIUM 8.5 (L) 08/14/2020   PROT 6.7 08/14/2020   ALBUMIN 4.1 08/14/2020   AST 19 08/14/2020   ALT 15 08/14/2020   ALKPHOS 79 08/14/2020   BILITOT 0.6 08/14/2020   GFRNONAA >60 08/14/2020   GFRAA >60 04/24/2020    Lab Results  Component Value Date   WBC 10.3 08/14/2020   NEUTROABS 8.1 (H) 08/14/2020   HGB 15.0 08/14/2020   HCT 41.7 08/14/2020   MCV 95.0 08/14/2020   PLT 240 08/14/2020     STUDIES:  ONCOLOGY HISTORY: Case discussed  with pathologist and unable to  determine whether this is adenocarcinoma or squamous cell carcinoma.  There is also insufficient tissue to do further testing.  Liquid biopsy did not reveal any actionable mutations.  MRI of the brain completed on Mar 28, 2018 reviewed independently did not reveal metastatic disease.  Patient completed XRT June 26, 2018.  He completed his concurrent single agent carboplatinum on June 21, 2018.  Patient had a reaction to Taxol during cycle 1 and this was discontinued.  He completed a year-long of maintenance durvalumab on June 27, 2019.   ASSESSMENT: Stage IIIa non-small cell lung cancer, right middle lobe.  PLAN:    1. Stage IIIa non-small cell lung cancer, right middle lobe: CT scan results from August 18, 2020 reviewed independently and report as above with essentially stable disease and no clear evidence of progression or metastatic disease.  No intervention is needed at this time.  We will repeat CT scan in 3 months with follow-up 1 to 2 days later.   2.  Secondary polycythemia: Resolved.  Likely secondary to heavy tobacco use.  Previously, the remainder of his laboratory work, including JAK-2 mutation, was either negative or within normal limits.  3.  Pulmonary nodule: Patient noted to have 2 left upper lobe lung nodules measuring 0.5 cm and 0.69 cm that are both stable and unchanged.  Repeat CT scan as above. 4.  Colon polyps:  Patient has a personal history of greater then 10 adenomatous polyps on his most recent conoloscopy. He does not know of any family history of increased polyps or colon cancer.  Genetic testing to assess for the APC mutation for FAP or AFAP was negative. Continue colonoscopies as per GI. 5. Tobacco Use: Patient continues to heavily smoke.  He expressed understanding by continuing tobacco use increases his chance of recurrence. 6. Anxiety: Chronic. Continue Valium 10 mg every 12 hours as needed.  Continue treatment and evaluation per  primary care. 7.  Hyponatremia: Chronic and unchanged.  Patient sodium levels of 130. 8.  Hypothyroidism: Appreciate endocrinology input.  Continue Synthroid as prescribed.  Follow-up with endocrinology as scheduled. 9.  CHF: Patient has an EF of 40%.  Continue evaluation and treatment per cardiology.    Patient expressed understanding and was in agreement with this plan. He also understands that He can call clinic at any time with any questions, concerns, or complaints.    [name], MD   08/20/2020 2:02 PM

## 2020-08-18 ENCOUNTER — Other Ambulatory Visit: Payer: Self-pay | Admitting: Internal Medicine

## 2020-08-18 ENCOUNTER — Telehealth: Payer: Self-pay | Admitting: Internal Medicine

## 2020-08-18 ENCOUNTER — Other Ambulatory Visit: Payer: Self-pay

## 2020-08-18 ENCOUNTER — Ambulatory Visit
Admission: RE | Admit: 2020-08-18 | Discharge: 2020-08-18 | Disposition: A | Discharge status: Home / Self Care | Payer: BC Managed Care – PPO | Source: Ambulatory Visit | Attending: Oncology | Admitting: Oncology

## 2020-08-18 DIAGNOSIS — J449 Chronic obstructive pulmonary disease, unspecified: Secondary | ICD-10-CM

## 2020-08-18 DIAGNOSIS — C3491 Malignant neoplasm of unspecified part of right bronchus or lung: Secondary | ICD-10-CM | POA: Diagnosis present

## 2020-08-18 HISTORY — DX: Unspecified asthma, uncomplicated: J45.909

## 2020-08-18 MED ORDER — IOHEXOL 300 MG/ML  SOLN
75.0000 mL | Freq: Once | INTRAMUSCULAR | Status: AC | PRN
  Administered 2020-08-18: 75 mL via INTRAVENOUS

## 2020-08-18 MED ORDER — ANORO ELLIPTA 62.5-25 MCG/INH IN AEPB: 1.0000 | INHALATION_SPRAY | Freq: Every day | RESPIRATORY_TRACT | 1 refills | Status: DC

## 2020-08-18 NOTE — Telephone Encounter (Signed)
Rx for Anoro has been sent to preferred pharmacy.  Patient is aware and voiced his understanding.  Nothing further needed.

## 2020-08-19 ENCOUNTER — Encounter: Payer: Self-pay | Admitting: Radiation Oncology

## 2020-08-20 ENCOUNTER — Encounter: Payer: Self-pay | Admitting: Oncology

## 2020-08-20 ENCOUNTER — Encounter: Payer: Self-pay | Admitting: Radiation Oncology

## 2020-08-20 ENCOUNTER — Inpatient Hospital Stay: Payer: BC Managed Care – PPO | Admitting: Oncology

## 2020-08-20 ENCOUNTER — Other Ambulatory Visit: Payer: Self-pay

## 2020-08-20 ENCOUNTER — Ambulatory Visit
Admission: RE | Admit: 2020-08-20 | Discharge: 2020-08-20 | Disposition: A | Discharge status: Home / Self Care | Payer: BC Managed Care – PPO | Source: Ambulatory Visit | Attending: Radiation Oncology | Admitting: Radiation Oncology

## 2020-08-20 VITALS — BP 141/81 | HR 88 | Temp 96.2°F | Resp 20 | Wt 210.0 lb

## 2020-08-20 VITALS — BP 141/81 | HR 88 | Temp 96.2°F | Wt 210.0 lb

## 2020-08-20 DIAGNOSIS — C342 Malignant neoplasm of middle lobe, bronchus or lung: Secondary | ICD-10-CM

## 2020-08-20 DIAGNOSIS — Z452 Encounter for adjustment and management of vascular access device: Secondary | ICD-10-CM | POA: Diagnosis not present

## 2020-08-20 DIAGNOSIS — C3491 Malignant neoplasm of unspecified part of right bronchus or lung: Secondary | ICD-10-CM | POA: Diagnosis not present

## 2020-08-20 NOTE — Progress Notes (Signed)
Radiation Oncology Follow up Note  Name: Paul Carpenter   Date:   08/20/2020 MRN:  [id number] DOB: 13-May-1956    This 64 y.o. male presents to the clinic today for over 2-year follow-up status post concurrent chemoradiation therapy for stage IIIa non-small cell lung cancer of the right middle lobe.  REFERRING PROVIDER: Kirk Ruths, MD  HPI: Patient is a 64 year old male now out over 2 years having completed concurrent chemoradiation therapy for stage IIIa non-small cell lung cancer of the right middle lobe..  We have been following with a PET scan back in July showing improvement in SUV of his right apical lung nodule with adjacent bandlike density.  We repeated his CT scan this month showing stable right apical cavitary lesion with surrounding nodular soft tissue thickening with recommendation for close follow-up.  There also was a stable 4 mm left upper lobe pulmonary nodule.  Patient is otherwise doing well specifically denies cough hemoptysis or chest tightness p.o. intake is good and weight is stable.  COMPLICATIONS OF TREATMENT: none  FOLLOW UP COMPLIANCE: keeps appointments   PHYSICAL EXAM:  BP (!) 141/81 (BP Location: Left Arm, Patient Position: Sitting, Cuff Size: Normal)   Pulse 88   Temp (!) 96.2 F (35.7 C) (Tympanic)   Wt 210 lb (95.3 kg)   BMI 28.48 kg/m  Well-developed well-nourished patient in NAD. HEENT reveals PERLA, EOMI, discs not visualized.  Oral cavity is clear. No oral mucosal lesions are identified. Neck is clear without evidence of cervical or supraclavicular adenopathy. Lungs are clear to A&P. Cardiac examination is essentially unremarkable with regular rate and rhythm without murmur rub or thrill. Abdomen is benign with no organomegaly or masses noted. Motor sensory and DTR levels are equal and symmetric in the upper and lower extremities. Cranial nerves II through XII are grossly intact. Proprioception is intact. No peripheral adenopathy or edema is  identified. No motor or sensory levels are noted. Crude visual fields are within normal range.  RADIOLOGY RESULTS: CT scans and PET CT scans reviewed  PLAN: At this time we will continue to observe the patient.  They have requested approximately 3 to 52-month follow-up CT scan which will be ordered by medical oncology.  I have asked to see back in 6 months for follow-up.  Would be happy to see the patient again should further radiation college opinion be indicated.  I would like to take this opportunity to thank you for allowing me to participate in the care of your patient.[name], MD

## 2020-08-21 ENCOUNTER — Ambulatory Visit: Payer: BC Managed Care – PPO | Admitting: Radiation Oncology

## 2020-08-21 ENCOUNTER — Ambulatory Visit: Payer: BC Managed Care – PPO | Admitting: Oncology

## 2020-08-27 ENCOUNTER — Other Ambulatory Visit: Payer: Self-pay | Admitting: Urology

## 2020-09-22 ENCOUNTER — Other Ambulatory Visit: Payer: Self-pay

## 2020-09-22 ENCOUNTER — Encounter: Payer: Self-pay | Admitting: Primary Care

## 2020-09-22 ENCOUNTER — Ambulatory Visit: Payer: BC Managed Care – PPO | Admitting: Primary Care

## 2020-09-22 VITALS — BP 136/80 | HR 99 | Temp 97.1°F | Ht 72.0 in | Wt 203.4 lb

## 2020-09-22 DIAGNOSIS — Z23 Encounter for immunization: Secondary | ICD-10-CM | POA: Diagnosis not present

## 2020-09-22 DIAGNOSIS — J432 Centrilobular emphysema: Secondary | ICD-10-CM | POA: Diagnosis not present

## 2020-09-22 NOTE — Progress Notes (Signed)
@Patient  ID: Paul Carpenter, male    DOB: 1956-04-13, 64 y.o.   MRN: [id number]  Chief Complaint  Patient presents with  . Follow-up    c/o sob with exertion, wheezing and non prod cough. completed course of  anx and prednisone last week for bronchitis. occ wears 2L QHS    Referring provider: Kirk Ruths, MD  HPI: 64 year old male, current everyday smoker.  Past medical history significant for COPD Gold C (FEV1 68%), non-small cell lung cancer, right (status post chemo and radiation), obstructive sleep apnea, chronic respiratory failure with hypoxia, hypertension, coronary artery disease, heart failure with reduced ejection fraction, migraine headaches, hypothyroidism, lipidemia, polycythemia, cervical radiculopathy.  Patient of Dr. [name], last seen in office on 04/19/2019.  09/22/2020- Interim hx Presents today for 1 year follow-up. He has chronic shortness of breath. He uses 2L oxygen intermittently at night d/t being a stomach sleeper. He was unable to tolerate mask. Recently treated for bronchitis with Amoxicillin x 10 days and prednisone prescribed by PCP. He is feeling better since completing. He is compliant with Anoro, however, this is costing him $100 dollars a month. He has needed to use his Albuterol rescue inhaler/nebulizer 2-3 times total last several weeks. He has a chronic productive cough with currently clear mucus. He has a lot of sinus drainage. He had two sinus surgeries in the past. He last saw oncology in October, due for repeat CT chest wo contrast in January. He has not had his flu shot. He is overdue for second COVID-19 vaccine and does not want to get them all at the same time.    Pulmonary function testing:  04/2017 PFT's shows ratio 62 with Fev1 68% 2.6L with air trapping and hyperinflation  Imaging: CT scan results from Mar 23, 2019 reviewed by Dr/ [name], stable disease and no evidence of recurrence or progression.   Allergies  Allergen Reactions  .  Lisinopril Rash  . Varenicline Rash    Immunization History  Administered Date(s) Administered  . Fluad Quad(high Dose 65+) 09/22/2020  . Influenza Inj Mdck Quad Pf 09/06/2016, 10/02/2019  . Influenza Split 08/05/2013, 07/23/2014, 08/18/2015  . Influenza Whole 08/01/2012  . Influenza,inj,Quad PF,6+ Mos 08/14/2018  . Influenza-Unspecified 09/06/2016, 08/17/2017  . Zoster Recombinat (Shingrix) 06/19/2020    Past Medical History:  Diagnosis Date  . Anginal pain ([place])   . Anxiety   . Asthma   . Chest pain   . CHF (congestive heart failure) ([place])   . Chicken pox   . Complication of anesthesia    o2 dropped after neck fusion  . COPD (chronic obstructive pulmonary disease) ([place])   . Coronary artery disease   . Cough    chronic  clear phlegm  . Dysrhythmia    palpitations  . GERD (gastroesophageal reflux disease)    h/o reflux/ hoarsness  . Hematochezia   . Hemorrhoids   . History of chickenpox   . History of colon polyps   . History of Helicobacter pylori infection   . Hoarseness   . Hypertension   . Lung cancer ([place]) 05/2016   Chemo + rad tx's.   . Migraines   . OSA (obstructive sleep apnea)    has CPAP but does not use  . Personal history of tobacco use, presenting hazards to health 03/05/2016  . Pneumonia    5/17  . Raynaud disease   . Raynaud disease   . Raynaud's disease   . Rotator cuff tear    on right  .  Shortness of breath dyspnea   . Sleep apnea   . Ulcer (traumatic) of oral mucosa     Tobacco History: Social History   Tobacco Use  Smoking Status Current Every Day Smoker  . Packs/day: 3.00  . Years: 45.00  . Pack years: 135.00  . Types: Cigarettes  Smokeless Tobacco Never Used  Tobacco Comment   2 PPD 09/22/2020   Ready to quit: Not Answered Counseling given: Not Answered Comment: 2 PPD 09/22/2020   Outpatient Medications Prior to Visit  Medication Sig Dispense Refill  . acetaminophen (TYLENOL) 500 MG tablet Take 1,000 mg by mouth every 6  (six) hours as needed (for pain.).    Marland Kitchen albuterol (PROAIR HFA) 108 (90 Base) MCG/ACT inhaler Inhale 2 puffs into the lungs every 4 (four) hours as needed for wheezing or shortness of breath. 1 Inhaler 1  . atorvastatin (LIPITOR) 40 MG tablet Take 40 mg by mouth at bedtime.     . busPIRone (BUSPAR) 15 MG tablet Take 15 mg by mouth 2 (two) times daily.   10  . diazepam (VALIUM) 5 MG tablet Take 2 tablets (10 mg total) by mouth every 12 (twelve) hours as needed for anxiety. (Patient taking differently: Take 5 mg by mouth every 8 (eight) hours as needed for anxiety. ) 60 tablet 0  . DULoxetine (CYMBALTA) 60 MG capsule Take 1 capsule by mouth 1 day or 1 dose.    . fluticasone (FLONASE) 50 MCG/ACT nasal spray Place 1 spray into both nostrils daily as needed (for allergies.).     Marland Kitchen ipratropium-albuterol (DUONEB) 0.5-2.5 (3) MG/3ML SOLN Take 3 mLs by nebulization every 6 (six) hours. 360 mL 5  . levothyroxine (SYNTHROID) 150 MCG tablet Take 150 mcg by mouth daily before breakfast.    . levothyroxine (SYNTHROID) 25 MCG tablet TAKE 1 TABLET (25 MCG TOTAL) BY MOUTH DAILY BEFORE BREAKFAST. (Patient taking differently: Take 150 mcg by mouth daily before breakfast. ) 30 tablet 1  . lidocaine (XYLOCAINE) 2 % solution   2  . lidocaine-prilocaine (EMLA) cream Apply 1 application topically as needed. 30 g 1  . metoprolol succinate (TOPROL-XL) 25 MG 24 hr tablet Take 25 mg by mouth daily.    . Multiple Vitamin (MULTIVITAMIN WITH MINERALS) TABS tablet Take 1 tablet by mouth daily.     . Multiple Vitamins-Minerals (SUPER VIKAPS) TABS Take by mouth.    Marland Kitchen MYRBETRIQ 50 MG TB24 tablet TAKE 1 TABLET BY MOUTH EVERY DAY 30 tablet 3  . ondansetron (ZOFRAN) 8 MG tablet Take 8 mg by mouth every 8 (eight) hours as needed for nausea or vomiting.    . OXYGEN Inhale 2 L into the lungs at bedtime.     . pantoprazole (PROTONIX) 40 MG tablet Take 40 mg by mouth daily.  3  . potassium chloride (K-DUR) 10 MEQ tablet TAKE 1 TABLET (10  MEQ TOTAL) BY MOUTH ONCE DAILY.  3  . propranolol (INNOPRAN XL) 80 MG 24 hr capsule Take by mouth.    . umeclidinium-vilanterol (ANORO ELLIPTA) 62.5-25 MCG/INH AEPB Inhale 1 puff into the lungs daily. 1 each 1  . Tiotropium Bromide Monohydrate (SPIRIVA RESPIMAT) 2.5 MCG/ACT AERS Inhale into the lungs daily. 2 puffs    . nitroGLYCERIN (NITROSTAT) 0.4 MG SL tablet Place 0.4 mg under the tongue every 5 (five) minutes as needed for chest pain.      Facility-Administered Medications Prior to Visit  Medication Dose Route Frequency Provider Last Rate Last Admin  . heparin lock flush  100 unit/mL  500 Units Intravenous Once [name], MD       Review of Systems  Review of Systems  Constitutional: Negative.   Respiratory: Positive for cough. Negative for chest tightness and wheezing.   Cardiovascular: Negative.     Physical Exam  BP 136/80 (BP Location: Left Arm, Cuff Size: Normal)   Pulse 99   Temp (!) 97.1 F (36.2 C) (Temporal)   Ht 6' (1.829 m)   Wt 203 lb 6.4 oz (92.3 kg)   SpO2 100%   BMI 27.59 kg/m  Physical Exam Constitutional:      Appearance: Normal appearance.  HENT:     Mouth/Throat:     Mouth: Mucous membranes are moist.     Pharynx: Oropharynx is clear.  Cardiovascular:     Rate and Rhythm: Normal rate and regular rhythm.  Pulmonary:     Effort: Pulmonary effort is normal.     Breath sounds: Normal breath sounds. No wheezing, rhonchi or rales.     Comments: CTA Musculoskeletal:        General: Normal range of motion.  Skin:    General: Skin is warm and dry.  Neurological:     General: No focal deficit present.     Mental Status: He is alert and oriented to person, place, and time. Mental status is at baseline.  Psychiatric:        Mood and Affect: Mood normal.        Behavior: Behavior normal.        Thought Content: Thought content normal.        Judgment: Judgment normal.      Lab Results:  CBC    Component Value Date/Time   WBC 10.3  08/14/2020 1348   RBC 4.39 08/14/2020 1348   HGB 15.0 08/14/2020 1348   HGB 13.4 12/14/2018 1040   HCT 41.7 08/14/2020 1348   HCT 37.1 (L) 12/14/2018 1040   PLT 240 08/14/2020 1348   PLT 228 07/08/2017 0807   MCV 95.0 08/14/2020 1348   MCV 93 07/08/2017 0807   MCV 97 06/03/2014 1236   MCH 34.2 (H) 08/14/2020 1348   MCHC 36.0 08/14/2020 1348   RDW 11.9 08/14/2020 1348   RDW 13.4 07/08/2017 0807   RDW 17.0 (H) 06/03/2014 1236   LYMPHSABS 1.3 08/14/2020 1348   LYMPHSABS 1.8 07/08/2017 0807   LYMPHSABS 2.2 06/03/2014 1236   MONOABS 0.6 08/14/2020 1348   MONOABS 0.8 06/03/2014 1236   EOSABS 0.2 08/14/2020 1348   EOSABS 0.1 07/08/2017 0807   EOSABS 0.2 06/03/2014 1236   BASOSABS 0.1 08/14/2020 1348   BASOSABS 0.0 07/08/2017 0807   BASOSABS 0.2 (H) 06/03/2014 1236    BMET    Component Value Date/Time   NA 130 (L) 08/14/2020 1348   NA 136 12/25/2012 0951   K 3.7 08/14/2020 1348   K 4.0 12/25/2012 0951   CL 97 (L) 08/14/2020 1348   CL 102 12/25/2012 0951   CO2 26 08/14/2020 1348   CO2 31 12/25/2012 0951   GLUCOSE 146 (H) 08/14/2020 1348   GLUCOSE 86 12/25/2012 0951   BUN 12 08/14/2020 1348   BUN 10 12/25/2012 0951   CREATININE 1.01 08/14/2020 1348   CREATININE 0.88 12/25/2012 0951   CALCIUM 8.5 (L) 08/14/2020 1348   CALCIUM 8.9 12/25/2012 0951   GFRNONAA >60 08/14/2020 1348   GFRNONAA >60 12/25/2012 0951   GFRAA >60 04/24/2020 0801   GFRAA >60 12/25/2012 0951    BNP No  results found for: BNP  ProBNP No results found for: PROBNP  Imaging: No results found.   Assessment & Plan:   COPD (chronic obstructive pulmonary disease) ([place]) - Breathing has been baseline. Treated for bronchitis 1-2 weeks ago by PCP with Amoxicillin and prednisone. He is compliant with Anoro, however, this is costing him $100 dollars a month. He has Pharmacist, community. We will give him a copay card today. Patient continues to smoke, he is interested in quitting but it helps him cope. He  has tried NRT and Wellbutrin. Gave him a handout on resources to help him quit. Received high dose influenza vaccine today. Due for second covid-19 vaccine. FU in 6 months with Dr. Neta Mends, NP 09/22/2020

## 2020-09-22 NOTE — Patient Instructions (Addendum)
Nice meeting you today [name]  COPD: - Continue Anoro 1 puff daily - Use Albuterol rescue inhaler/nebulizer every 6 hours for breakthrough shortness of breath - Encourage you continue to work on efforts to quit smoking, recommend decreasing amount and picking a quit date * Glass blower/designer and asking what LABA/LAMA or alternative to Anoro inhaler is formulary on your plan _____________________________________________  Chronic respiratory failure: - Continue oxygen as prescribed at night  In office treatment: - High does flu vaccine today  Follow-up: - 6 months with Dr. Myrtice Carpenter  - Continue to follow regularly with oncology and cardiology    Chronic Obstructive Pulmonary Disease Chronic obstructive pulmonary disease (COPD) is a long-term (chronic) lung problem. When you have COPD, it is hard for air to get in and out of your lungs. Usually the condition gets worse over time, and your lungs will never return to normal. There are things you can do to keep yourself as healthy as possible.  Your doctor may treat your condition with: ? Medicines. ? Oxygen. ? Lung surgery.  Your doctor may also recommend: ? Rehabilitation. This includes steps to make your body work better. It may involve a team of specialists. ? Quitting smoking, if you smoke. ? Exercise and changes to your diet. ? Comfort measures (palliative care). Follow these instructions at home: Medicines  Take over-the-counter and prescription medicines only as told by your doctor.  Talk to your doctor before taking any cough or allergy medicines. You may need to avoid medicines that cause your lungs to be dry. Lifestyle  If you smoke, stop. Smoking makes the problem worse. If you need help quitting, ask your doctor.  Avoid being around things that make your breathing worse. This may include smoke, chemicals, and fumes.  Stay active, but remember to rest as well.  Learn and use tips on how to  relax.  Make sure you get enough sleep. Most adults need at least 7 hours of sleep every night.  Eat healthy foods. Eat smaller meals more often. Rest before meals. Controlled breathing Learn and use tips on how to control your breathing as told by your doctor. Try:  Breathing in (inhaling) through your nose for 1 second. Then, pucker your lips and breath out (exhale) through your lips for 2 seconds.  Putting one hand on your belly (abdomen). Breathe in slowly through your nose for 1 second. Your hand on your belly should move out. Pucker your lips and breathe out slowly through your lips. Your hand on your belly should move in as you breathe out.  Controlled coughing Learn and use controlled coughing to clear mucus from your lungs. Follow these steps: 1. Lean your head a little forward. 2. Breathe in deeply. 3. Try to hold your breath for 3 seconds. 4. Keep your mouth slightly open while coughing 2 times. 5. Spit any mucus out into a tissue. 6. Rest and do the steps again 1 or 2 times as needed. General instructions  Make sure you get all the shots (vaccines) that your doctor recommends. Ask your doctor about a flu shot and a pneumonia shot.  Use oxygen therapy and pulmonary rehabilitation if told by your doctor. If you need home oxygen therapy, ask your doctor if you should buy a tool to measure your oxygen level (oximeter).  Make a COPD action plan with your doctor. This helps you to know what to do if you feel worse than usual.  Manage any other conditions you have as told  by your doctor.  Avoid going outside when it is very hot, cold, or humid.  Avoid people who have a sickness you can catch (contagious).  Keep all follow-up visits as told by your doctor. This is important. Contact a doctor if:  You cough up more mucus than usual.  There is a change in the color or thickness of the mucus.  It is harder to breathe than usual.  Your breathing is faster than usual.  You  have trouble sleeping.  You need to use your medicines more often than usual.  You have trouble doing your normal activities such as getting dressed or walking around the house. Get help right away if:  You have shortness of breath while resting.  You have shortness of breath that stops you from: ? Being able to talk. ? Doing normal activities.  Your chest hurts for longer than 5 minutes.  Your skin color is more blue than usual.  Your pulse oximeter shows that you have low oxygen for longer than 5 minutes.  You have a fever.  You feel too tired to breathe normally. Summary  Chronic obstructive pulmonary disease (COPD) is a long-term lung problem.  The way your lungs work will never return to normal. Usually the condition gets worse over time. There are things you can do to keep yourself as healthy as possible.  Take over-the-counter and prescription medicines only as told by your doctor.  If you smoke, stop. Smoking makes the problem worse. This information is not intended to replace advice given to you by your health care provider. Make sure you discuss any questions you have with your health care provider. Document Revised: 09/30/2017 Document Reviewed: 11/22/2016 Elsevier Patient Education  2020 [employer].

## 2020-09-22 NOTE — Assessment & Plan Note (Addendum)
-   Breathing has been baseline. Treated for bronchitis 1-2 weeks ago by PCP with Amoxicillin and prednisone. He is compliant with Anoro, however, this is costing him $100 dollars a month. He has Pharmacist, community. We will give him a copay card today. Patient continues to smoke, he is interested in quitting but it helps him cope. He has tried NRT and Wellbutrin. Gave him a handout on resources to help him quit. Received high dose influenza vaccine today. Due for second covid-19 vaccine. FU in 6 months with Dr. [name]

## 2020-10-09 ENCOUNTER — Other Ambulatory Visit: Payer: Self-pay

## 2020-10-09 ENCOUNTER — Inpatient Hospital Stay: Payer: BC Managed Care – PPO | Attending: Oncology

## 2020-10-09 DIAGNOSIS — C342 Malignant neoplasm of middle lobe, bronchus or lung: Secondary | ICD-10-CM | POA: Insufficient documentation

## 2020-10-09 DIAGNOSIS — Z452 Encounter for adjustment and management of vascular access device: Secondary | ICD-10-CM | POA: Insufficient documentation

## 2020-10-09 DIAGNOSIS — Z95828 Presence of other vascular implants and grafts: Secondary | ICD-10-CM

## 2020-10-09 MED ORDER — SODIUM CHLORIDE 0.9% FLUSH
10.0000 mL | Freq: Once | INTRAVENOUS | Status: AC
  Administered 2020-10-09: 10 mL via INTRAVENOUS
  Filled 2020-10-09: qty 10

## 2020-10-09 MED ORDER — HEPARIN SOD (PORK) LOCK FLUSH 100 UNIT/ML IV SOLN
INTRAVENOUS | Status: AC
  Filled 2020-10-09: qty 5

## 2020-10-09 MED ORDER — HEPARIN SOD (PORK) LOCK FLUSH 100 UNIT/ML IV SOLN
500.0000 [IU] | Freq: Once | INTRAVENOUS | Status: AC
  Administered 2020-10-09: 500 [IU] via INTRAVENOUS
  Filled 2020-10-09: qty 5

## 2020-10-17 ENCOUNTER — Other Ambulatory Visit: Payer: Self-pay | Admitting: Internal Medicine

## 2020-10-17 DIAGNOSIS — J449 Chronic obstructive pulmonary disease, unspecified: Secondary | ICD-10-CM

## 2020-11-15 NOTE — Progress Notes (Signed)
[place]  [phone] [phone]  ID: Paul Carpenter OB: Mar 03, 1956  MR#: [id number]  TIW#:[id number]  Patient Care Team: Kirk Ruths, MD as PCP - General (Internal Medicine) Lucky Cowboy, Erskine Squibb, MD as Referring Physician (Vascular Surgery) [name], MD as Consulting Physician (Oncology) [name], MD as Referring Physician (Radiation Oncology)   CHIEF COMPLAINT: Stage IIIa non-small cell lung cancer, right middle lobe.  INTERVAL HISTORY: Patient returns to clinic today for further evaluation and discussion of his imaging and laboratory results.  He continues to feel well and remains asymptomatic.  He does not complain of cough today.  He continues to smoke.  He has no neurologic complaints.  He denies any recent fevers or illnesses.  He denies any chest pain, shortness of breath, or hemoptysis. He denies any nausea, vomiting, constipation, or diarrhea. He has no urinary complaints.  Patient offers no specific complaints today.  REVIEW OF SYSTEMS:   Review of Systems  Constitutional: Negative.  Negative for fever, malaise/fatigue and weight loss.  HENT: Negative.  Negative for congestion and sore throat.   Respiratory: Negative.  Negative for cough, hemoptysis and shortness of breath.   Cardiovascular: Negative.  Negative for chest pain and leg swelling.  Gastrointestinal: Negative.  Negative for abdominal pain, blood in stool and melena.  Genitourinary: Negative.  Negative for dysuria.  Musculoskeletal: Negative.  Negative for joint pain.  Skin: Negative.  Negative for rash.  Neurological: Negative.  Negative for dizziness, sensory change, focal weakness, weakness and headaches.  Psychiatric/Behavioral: Negative.  The patient is not nervous/anxious.     As per HPI. Otherwise, a complete review of systems is negative.  PAST MEDICAL HISTORY: Past Medical History:  Diagnosis Date  . Anginal pain ([place])   . Anxiety   .  Asthma   . Chest pain   . CHF (congestive heart failure) ([place])   . Chicken pox   . Complication of anesthesia    o2 dropped after neck fusion  . COPD (chronic obstructive pulmonary disease) ([place])   . Coronary artery disease   . Cough    chronic  clear phlegm  . Dysrhythmia    palpitations  . GERD (gastroesophageal reflux disease)    h/o reflux/ hoarsness  . Hematochezia   . Hemorrhoids   . History of chickenpox   . History of colon polyps   . History of Helicobacter pylori infection   . Hoarseness   . Hypertension   . Lung cancer ([place]) 05/2016   Chemo + rad tx's.   . Migraines   . OSA (obstructive sleep apnea)    has CPAP but does not use  . Personal history of tobacco use, presenting hazards to health 03/05/2016  . Pneumonia    5/17  . Raynaud disease   . Raynaud disease   . Raynaud's disease   . Rotator cuff tear    on right  . Shortness of breath dyspnea   . Sleep apnea   . Ulcer (traumatic) of oral mucosa     PAST SURGICAL HISTORY: Past Surgical History:  Procedure Laterality Date  . BACK SURGERY     cervical fusion x 2  . CARDIAC CATHETERIZATION    . CERVICAL DISCECTOMY     x 2  . COLONOSCOPY    . COLONOSCOPY N/A 07/25/2015   Procedure: COLONOSCOPY;  Surgeon: Lollie Sails, MD;  Location[facility] ENDOSCOPY;  Service: Endoscopy;  Laterality: N/A;  . COLONOSCOPY WITH PROPOFOL N/A 10/04/2017  Procedure: COLONOSCOPY WITH PROPOFOL;  Surgeon: Lollie Sails, MD;  Location: [facility] ENDOSCOPY;  Service: Endoscopy;  Laterality: N/A;  . COLONOSCOPY WITH PROPOFOL N/A 07/10/2020   Procedure: COLONOSCOPY WITH PROPOFOL;  Surgeon: Toledo, Benay Pike, MD;  Location: [facility] ENDOSCOPY;  Service: Gastroenterology;  Laterality: N/A;  . ELECTROMAGNETIC NAVIGATION BROCHOSCOPY Left 06/28/2016   Procedure: ELECTROMAGNETIC NAVIGATION BRONCHOSCOPY;  Surgeon: Vilinda Boehringer, MD;  Location: [facility] ORS;  Service: Cardiopulmonary;  Laterality: Left;  . ENDOBRONCHIAL ULTRASOUND N/A 04/11/2018    Procedure: ENDOBRONCHIAL ULTRASOUND;  Surgeon: [name], MD;  Location: [facility] ORS;  Service: Cardiopulmonary;  Laterality: N/A;  . ESOPHAGOGASTRODUODENOSCOPY N/A 07/25/2015   Procedure: ESOPHAGOGASTRODUODENOSCOPY (EGD);  Surgeon: Lollie Sails, MD;  Location: [facility] ENDOSCOPY;  Service: Endoscopy;  Laterality: N/A;  . ESOPHAGOGASTRODUODENOSCOPY (EGD) WITH PROPOFOL N/A 07/10/2020   Procedure: ESOPHAGOGASTRODUODENOSCOPY (EGD) WITH PROPOFOL;  Surgeon: Toledo, Benay Pike, MD;  Location: [facility] ENDOSCOPY;  Service: Gastroenterology;  Laterality: N/A;  . NASAL SINUS SURGERY     x 2   . PORTA CATH INSERTION N/A 04/24/2018   Procedure: PORTA CATH INSERTION;  Surgeon: Algernon Huxley, MD;  Location: [place] CV LAB;  Service: Cardiovascular;  Laterality: N/A;  . rotator cuff surgery Right    07/2016  . SEPTOPLASTY    . SKIN GRAFT      FAMILY HISTORY Family History  Problem Relation Age of Onset  . Heart disease Father   . Prostate cancer Father   . Heart disease Paternal Grandmother   . Heart attack Maternal Grandfather 52  . Kidney cancer Neg Hx   . Bladder Cancer Neg Hx   . Other Neg Hx        pituitary abnormality       ADVANCED DIRECTIVES:    HEALTH MAINTENANCE: Social History   Tobacco Use  . Smoking status: Current Every Day Smoker    Packs/day: 3.00    Years: 45.00    Pack years: 135.00    Types: Cigarettes  . Smokeless tobacco: Never Used  . Tobacco comment: 2 PPD 09/22/2020  Vaping Use  . Vaping Use: Never used  Substance Use Topics  . Alcohol use: Yes    Alcohol/week: 2.0 standard drinks    Types: 2 Standard drinks or equivalent per week    Comment: moderate  . Drug use: No     Allergies  Allergen Reactions  . Lisinopril Rash  . Varenicline Rash    Current Outpatient Medications  Medication Sig Dispense Refill  . acetaminophen (TYLENOL) 500 MG tablet Take 500 mg by mouth daily.    Marland Kitchen albuterol (PROAIR HFA) 108 (90 Base) MCG/ACT inhaler Inhale 2 puffs  into the lungs every 4 (four) hours as needed for wheezing or shortness of breath. 1 Inhaler 1  . ANORO ELLIPTA 62.5-25 MCG/INH AEPB TAKE 1 PUFF BY MOUTH EVERY DAY 60 each 1  . atorvastatin (LIPITOR) 40 MG tablet Take 40 mg by mouth at bedtime.     . busPIRone (BUSPAR) 15 MG tablet Take 15 mg by mouth daily. Takes 0.5mg  daily  10  . diazepam (VALIUM) 5 MG tablet Take 2 tablets (10 mg total) by mouth every 12 (twelve) hours as needed for anxiety. (Patient taking differently: Take 5 mg by mouth every 8 (eight) hours as needed for anxiety.) 60 tablet 0  . DULoxetine (CYMBALTA) 60 MG capsule Take 1 capsule by mouth 1 day or 1 dose.    . fluticasone (FLONASE) 50 MCG/ACT nasal spray Place 1 spray into both nostrils  daily as needed (for allergies.).     Marland Kitchen ipratropium-albuterol (DUONEB) 0.5-2.5 (3) MG/3ML SOLN Take 3 mLs by nebulization every 6 (six) hours. (Patient taking differently: Take 3 mLs by nebulization as needed.) 360 mL 5  . levothyroxine (SYNTHROID) 150 MCG tablet Take 150 mcg by mouth daily before breakfast.    . lidocaine (XYLOCAINE) 2 % solution   2  . lidocaine-prilocaine (EMLA) cream Apply 1 application topically as needed. 30 g 1  . metoprolol succinate (TOPROL-XL) 25 MG 24 hr tablet Take 25 mg by mouth daily.    . Multiple Vitamin (MULTIVITAMIN WITH MINERALS) TABS tablet Take 1 tablet by mouth daily.     Marland Kitchen MYRBETRIQ 50 MG TB24 tablet TAKE 1 TABLET BY MOUTH EVERY DAY 30 tablet 3  . nitroGLYCERIN (NITROSTAT) 0.4 MG SL tablet Place 0.4 mg under the tongue every 5 (five) minutes as needed for chest pain.     . potassium chloride (K-DUR) 10 MEQ tablet TAKE 1 TABLET (10 MEQ TOTAL) BY MOUTH ONCE DAILY.  3  . propranolol (INNOPRAN XL) 80 MG 24 hr capsule Take by mouth.    . ondansetron (ZOFRAN) 8 MG tablet Take 8 mg by mouth every 8 (eight) hours as needed for nausea or vomiting. (Patient not taking: Reported on 11/20/2020)    . OXYGEN Inhale 2 L into the lungs at bedtime.  (Patient not taking:  Reported on 11/20/2020)     No current facility-administered medications for this visit.   Facility-Administered Medications Ordered in Other Visits  Medication Dose Route Frequency Provider Last Rate Last Admin  . heparin lock flush 100 unit/mL  500 Units Intravenous Once [name], MD        OBJECTIVE: Vitals:   11/20/20 1053  BP: (!) 154/88  Pulse: 93  Temp: 98.1 F (36.7 C)     Body mass index is 28.2 kg/m.    ECOG FS:0 - Asymptomatic  General: Well-developed, well-nourished, no acute distress. Eyes: Pink conjunctiva, anicteric sclera. HEENT: Normocephalic, moist mucous membranes. Lungs: No audible wheezing or coughing. Heart: Regular rate and rhythm. Abdomen: Soft, nontender, no obvious distention. Musculoskeletal: No edema, cyanosis, or clubbing. Neuro: Alert, answering all questions appropriately. Cranial nerves grossly intact. Skin: No rashes or petechiae noted. Psych: Normal affect.  LAB RESULTS:  Lab Results  Component Value Date   NA 127 (L) 11/17/2020   K 3.6 11/17/2020   CL 91 (L) 11/17/2020   CO2 26 11/17/2020   GLUCOSE 156 (H) 11/17/2020   BUN 12 11/17/2020   CREATININE 0.82 11/17/2020   CALCIUM 8.6 (L) 11/17/2020   PROT 6.5 11/17/2020   ALBUMIN 4.0 11/17/2020   AST 24 11/17/2020   ALT 21 11/17/2020   ALKPHOS 65 11/17/2020   BILITOT 0.5 11/17/2020   GFRNONAA >60 11/17/2020   GFRAA >60 04/24/2020    Lab Results  Component Value Date   WBC 15.5 (H) 11/17/2020   NEUTROABS 13.9 (H) 11/17/2020   HGB 14.9 11/17/2020   HCT 41.5 11/17/2020   MCV 96.3 11/17/2020   PLT 223 11/17/2020     STUDIES:  ONCOLOGY HISTORY: Case discussed with pathologist and unable to determine whether this is adenocarcinoma or squamous cell carcinoma.  There is also insufficient tissue to do further testing.  Liquid biopsy did not reveal any actionable mutations.  MRI of the brain completed on Mar 28, 2018 reviewed independently did not reveal metastatic  disease.  Patient completed XRT June 26, 2018.  He completed his concurrent single agent carboplatinum  on June 21, 2018.  Patient had a reaction to Taxol during cycle 1 and this was discontinued.  He completed a year-long of maintenance durvalumab on June 27, 2019.   ASSESSMENT: Stage IIIa non-small cell lung cancer, right middle lobe.  PLAN:    1. Stage IIIa non-small cell lung cancer, right middle lobe: See oncology history above.  CT scan results from November 17, 2020 reviewed independently and reported as above with essentially stable disease with no clear evidence of progression or metastatic disease.  No intervention is needed at this time.  Return to clinic in 6 months with repeat imaging and further evaluation.   2.  Secondary polycythemia: Resolved.  Likely secondary to heavy tobacco use.  Previously, the remainder of his laboratory work, including JAK-2 mutation, was either negative or within normal limits.  3.  Pulmonary nodule: Patient noted to have 2 left upper lobe lung nodules that are unchanged on CT scan above.  Repeat imaging in 6 months.   4.  Colon polyps:  Patient has a personal history of greater then 10 adenomatous polyps on his most recent conoloscopy. He does not know of any family history of increased polyps or colon cancer.  Genetic testing to assess for the APC mutation for FAP or AFAP was negative. Continue colonoscopies as per GI. 5. Tobacco Use: Patient continues to heavily smoke.  He expressed understanding by continuing tobacco use increases his chance of recurrence. 6. Anxiety: Chronic. Continue Valium 10 mg every 12 hours as needed.  Continue treatment and evaluation per primary care. 7.  Hyponatremia: Slightly worse today.  Patient sodium level was 127.  Monitor. 8.  Hypothyroidism: Appreciate endocrinology input.  Continue Synthroid as prescribed.  Follow-up with endocrinology as scheduled. 9.  CHF: Patient has an EF of 40%.  Continue evaluation and treatment  per cardiology. 10.  Leukocytosis: Likely reactive, monitor.    Patient expressed understanding and was in agreement with this plan. He also understands that He can call clinic at any time with any questions, concerns, or complaints.    [name], MD   11/22/2020 9:24 AM

## 2020-11-17 ENCOUNTER — Inpatient Hospital Stay: Payer: BC Managed Care – PPO | Attending: Oncology

## 2020-11-17 ENCOUNTER — Other Ambulatory Visit: Payer: Self-pay

## 2020-11-17 ENCOUNTER — Ambulatory Visit
Admission: RE | Admit: 2020-11-17 | Discharge: 2020-11-17 | Disposition: A | Discharge status: Home / Self Care | Payer: BC Managed Care – PPO | Source: Ambulatory Visit | Attending: Oncology | Admitting: Oncology

## 2020-11-17 DIAGNOSIS — Z95828 Presence of other vascular implants and grafts: Secondary | ICD-10-CM

## 2020-11-17 DIAGNOSIS — F419 Anxiety disorder, unspecified: Secondary | ICD-10-CM | POA: Insufficient documentation

## 2020-11-17 DIAGNOSIS — D72829 Elevated white blood cell count, unspecified: Secondary | ICD-10-CM | POA: Insufficient documentation

## 2020-11-17 DIAGNOSIS — Z923 Personal history of irradiation: Secondary | ICD-10-CM | POA: Insufficient documentation

## 2020-11-17 DIAGNOSIS — C3491 Malignant neoplasm of unspecified part of right bronchus or lung: Secondary | ICD-10-CM

## 2020-11-17 DIAGNOSIS — I110 Hypertensive heart disease with heart failure: Secondary | ICD-10-CM | POA: Insufficient documentation

## 2020-11-17 DIAGNOSIS — F17210 Nicotine dependence, cigarettes, uncomplicated: Secondary | ICD-10-CM | POA: Insufficient documentation

## 2020-11-17 DIAGNOSIS — I2510 Atherosclerotic heart disease of native coronary artery without angina pectoris: Secondary | ICD-10-CM | POA: Insufficient documentation

## 2020-11-17 DIAGNOSIS — J449 Chronic obstructive pulmonary disease, unspecified: Secondary | ICD-10-CM | POA: Insufficient documentation

## 2020-11-17 DIAGNOSIS — G4733 Obstructive sleep apnea (adult) (pediatric): Secondary | ICD-10-CM | POA: Diagnosis not present

## 2020-11-17 DIAGNOSIS — E039 Hypothyroidism, unspecified: Secondary | ICD-10-CM | POA: Insufficient documentation

## 2020-11-17 DIAGNOSIS — C342 Malignant neoplasm of middle lobe, bronchus or lung: Secondary | ICD-10-CM | POA: Insufficient documentation

## 2020-11-17 DIAGNOSIS — Z452 Encounter for adjustment and management of vascular access device: Secondary | ICD-10-CM | POA: Diagnosis present

## 2020-11-17 DIAGNOSIS — Z8249 Family history of ischemic heart disease and other diseases of the circulatory system: Secondary | ICD-10-CM | POA: Insufficient documentation

## 2020-11-17 DIAGNOSIS — I509 Heart failure, unspecified: Secondary | ICD-10-CM | POA: Insufficient documentation

## 2020-11-17 DIAGNOSIS — Z79899 Other long term (current) drug therapy: Secondary | ICD-10-CM | POA: Insufficient documentation

## 2020-11-17 LAB — CBC WITH DIFFERENTIAL/PLATELET
Abs Immature Granulocytes: 0.12 10*3/uL — ABNORMAL HIGH (ref 0.00–0.07)
Basophils Absolute: 0.1 10*3/uL (ref 0.0–0.1)
Basophils Relative: 0 %
Eosinophils Absolute: 0 10*3/uL (ref 0.0–0.5)
Eosinophils Relative: 0 %
HCT: 41.5 % (ref 39.0–52.0)
Hemoglobin: 14.9 g/dL (ref 13.0–17.0)
Immature Granulocytes: 1 %
Lymphocytes Relative: 5 %
Lymphs Abs: 0.8 10*3/uL (ref 0.7–4.0)
MCH: 34.6 pg — ABNORMAL HIGH (ref 26.0–34.0)
MCHC: 35.9 g/dL (ref 30.0–36.0)
MCV: 96.3 fL (ref 80.0–100.0)
Monocytes Absolute: 0.6 10*3/uL (ref 0.1–1.0)
Monocytes Relative: 4 %
Neutro Abs: 13.9 10*3/uL — ABNORMAL HIGH (ref 1.7–7.7)
Neutrophils Relative %: 90 %
Platelets: 223 10*3/uL (ref 150–400)
RBC: 4.31 MIL/uL (ref 4.22–5.81)
RDW: 12.1 % (ref 11.5–15.5)
WBC: 15.5 10*3/uL — ABNORMAL HIGH (ref 4.0–10.5)
nRBC: 0 % (ref 0.0–0.2)

## 2020-11-17 LAB — POCT I-STAT CREATININE: Creatinine, Ser: 0.9 mg/dL (ref 0.61–1.24)

## 2020-11-17 LAB — COMPREHENSIVE METABOLIC PANEL
ALT: 21 U/L (ref 0–44)
AST: 24 U/L (ref 15–41)
Albumin: 4 g/dL (ref 3.5–5.0)
Alkaline Phosphatase: 65 U/L (ref 38–126)
Anion gap: 10 (ref 5–15)
BUN: 12 mg/dL (ref 8–23)
CO2: 26 mmol/L (ref 22–32)
Calcium: 8.6 mg/dL — ABNORMAL LOW (ref 8.9–10.3)
Chloride: 91 mmol/L — ABNORMAL LOW (ref 98–111)
Creatinine, Ser: 0.82 mg/dL (ref 0.61–1.24)
GFR, Estimated: >60 mL/min (ref >60)
Glucose, Bld: 156 mg/dL — ABNORMAL HIGH (ref 70–99)
Potassium: 3.6 mmol/L (ref 3.5–5.1)
Sodium: 127 mmol/L — ABNORMAL LOW (ref 135–145)
Total Bilirubin: 0.5 mg/dL (ref 0.3–1.2)
Total Protein: 6.5 g/dL (ref 6.5–8.1)

## 2020-11-17 MED ORDER — IOHEXOL 300 MG/ML  SOLN
75.0000 mL | Freq: Once | INTRAMUSCULAR | Status: AC | PRN
  Administered 2020-11-17: 75 mL via INTRAVENOUS

## 2020-11-17 MED ORDER — SODIUM CHLORIDE 0.9% FLUSH
10.0000 mL | Freq: Once | INTRAVENOUS | Status: AC
  Administered 2020-11-17: 10 mL via INTRAVENOUS
  Filled 2020-11-17: qty 10

## 2020-11-17 MED ORDER — HEPARIN SOD (PORK) LOCK FLUSH 100 UNIT/ML IV SOLN
500.0000 [IU] | Freq: Once | INTRAVENOUS | Status: AC
  Administered 2020-11-17: 500 [IU] via INTRAVENOUS
  Filled 2020-11-17: qty 5

## 2020-11-18 ENCOUNTER — Other Ambulatory Visit: Payer: Self-pay | Admitting: Gastroenterology

## 2020-11-18 DIAGNOSIS — R1312 Dysphagia, oropharyngeal phase: Secondary | ICD-10-CM

## 2020-11-20 ENCOUNTER — Inpatient Hospital Stay: Payer: BC Managed Care – PPO | Admitting: Oncology

## 2020-11-20 ENCOUNTER — Encounter: Payer: Self-pay | Admitting: Oncology

## 2020-11-20 VITALS — BP 154/88 | HR 93 | Temp 98.1°F | Wt 207.9 lb

## 2020-11-20 DIAGNOSIS — Z452 Encounter for adjustment and management of vascular access device: Secondary | ICD-10-CM | POA: Diagnosis not present

## 2020-11-20 DIAGNOSIS — C3491 Malignant neoplasm of unspecified part of right bronchus or lung: Secondary | ICD-10-CM | POA: Diagnosis not present

## 2020-11-20 NOTE — Progress Notes (Signed)
Patient wants to discuss CT scan results, he also stated that he's been feeling dizzy for the past two weeks

## 2020-12-03 ENCOUNTER — Ambulatory Visit
Admission: RE | Admit: 2020-12-03 | Discharge: 2020-12-03 | Disposition: A | Discharge status: Home / Self Care | Payer: BC Managed Care – PPO | Source: Ambulatory Visit | Attending: Gastroenterology | Admitting: Gastroenterology

## 2020-12-03 ENCOUNTER — Other Ambulatory Visit: Payer: Self-pay

## 2020-12-03 DIAGNOSIS — R1312 Dysphagia, oropharyngeal phase: Secondary | ICD-10-CM | POA: Diagnosis present

## 2020-12-03 NOTE — Therapy (Signed)
Paul Carpenter, [place], [address] Phone: [phone]   Fax:     Modified Barium Swallow  Patient Details  Name: Paul Carpenter MRN: [id number] Date of Birth: 03/15/1956 No data recorded  Encounter Date: 12/03/2020   End of Session - 12/03/20 1326    Visit Number 1    Number of Visits 1    Date for SLP Re-Evaluation 12/03/20    SLP Start Time 1303    SLP Stop Time  1325    SLP Time Calculation (min) 22 min    Activity Tolerance Patient tolerated treatment well           Past Medical History:  Diagnosis Date  . Anginal pain ([place])   . Anxiety   . Asthma   . Chest pain   . CHF (congestive heart failure) ([place])   . Chicken pox   . Complication of anesthesia    o2 dropped after neck fusion  . COPD (chronic obstructive pulmonary disease) ([place])   . Coronary artery disease   . Cough    chronic  clear phlegm  . Dysrhythmia    palpitations  . GERD (gastroesophageal reflux disease)    h/o reflux/ hoarsness  . Hematochezia   . Hemorrhoids   . History of chickenpox   . History of colon polyps   . History of Helicobacter pylori infection   . Hoarseness   . Hypertension   . Lung cancer ([place]) 05/2016   Chemo + rad tx's.   . Migraines   . OSA (obstructive sleep apnea)    has CPAP but does not use  . Personal history of tobacco use, presenting hazards to health 03/05/2016  . Pneumonia    5/17  . Raynaud disease   . Raynaud disease   . Raynaud's disease   . Rotator cuff tear    on right  . Shortness of breath dyspnea   . Sleep apnea   . Ulcer (traumatic) of oral mucosa     Past Surgical History:  Procedure Laterality Date  . BACK SURGERY     cervical fusion x 2  . CARDIAC CATHETERIZATION    . CERVICAL DISCECTOMY     x 2  . COLONOSCOPY    . COLONOSCOPY N/A 07/25/2015   Procedure: COLONOSCOPY;  Surgeon: Lollie Sails, MD;  Location[facility] ENDOSCOPY;  Service: Endoscopy;  Laterality:  N/A;  . COLONOSCOPY WITH PROPOFOL N/A 10/04/2017   Procedure: COLONOSCOPY WITH PROPOFOL;  Surgeon: Lollie Sails, MD;  Location: [facility] ENDOSCOPY;  Service: Endoscopy;  Laterality: N/A;  . COLONOSCOPY WITH PROPOFOL N/A 07/10/2020   Procedure: COLONOSCOPY WITH PROPOFOL;  Surgeon: Toledo, Benay Pike, MD;  Location: [facility] ENDOSCOPY;  Service: Gastroenterology;  Laterality: N/A;  . ELECTROMAGNETIC NAVIGATION BROCHOSCOPY Left 06/28/2016   Procedure: ELECTROMAGNETIC NAVIGATION BRONCHOSCOPY;  Surgeon: Vilinda Boehringer, MD;  Location: [facility] ORS;  Service: Cardiopulmonary;  Laterality: Left;  . ENDOBRONCHIAL ULTRASOUND N/A 04/11/2018   Procedure: ENDOBRONCHIAL ULTRASOUND;  Surgeon: [name], MD;  Location: [facility] ORS;  Service: Cardiopulmonary;  Laterality: N/A;  . ESOPHAGOGASTRODUODENOSCOPY N/A 07/25/2015   Procedure: ESOPHAGOGASTRODUODENOSCOPY (EGD);  Surgeon: Lollie Sails, MD;  Location: [facility] ENDOSCOPY;  Service: Endoscopy;  Laterality: N/A;  . ESOPHAGOGASTRODUODENOSCOPY (EGD) WITH PROPOFOL N/A 07/10/2020   Procedure: ESOPHAGOGASTRODUODENOSCOPY (EGD) WITH PROPOFOL;  Surgeon: Toledo, Benay Pike, MD;  Location: [facility] ENDOSCOPY;  Service: Gastroenterology;  Laterality: N/A;  . NASAL SINUS SURGERY     x 2   .  PORTA CATH INSERTION N/A 04/24/2018   Procedure: PORTA CATH INSERTION;  Surgeon: Algernon Huxley, MD;  Location: [facility] CV LAB;  Service: Cardiovascular;  Laterality: N/A;  . rotator cuff surgery Right    07/2016  . SEPTOPLASTY    . SKIN GRAFT      There were no vitals filed for this visit.   Subjective Assessment - 12/03/20 1325    Subjective "It's like I can't get the reflex."    Currently in Pain? No/denies          Objective Swallowing Evaluation: Type of Study: MBS-Modified Barium Swallow Study   Patient Details  Name: Paul Carpenter MRN: [id number] Date of Birth: [birth year]  Today's Date: 12/03/2020 Time: SLP Start Time (ACUTE ONLY): 2505 -SLP Stop Time (ACUTE ONLY): 1325  SLP  Time Calculation (min) (ACUTE ONLY): 22 min   HPI: Paul Carpenter is a 65 y.o. male referred for MBS by Paul November, NP due to reports of mid-neck dysphagia. Pt reports he coughs with liquids, primarily, "a couple times a week," and occasionally feels he has trouble initiating his swallow reflex. PMHx is noted for reflux, esophagitis, gastritis, duodenities, COPD, OSA, HLD, CAD, non-small cell lung cancer s/p chemo, XRT completed 06/26/18, hypothyroidism.   No data recorded   Assessment / Plan / Recommendation  CHL IP CLINICAL IMPRESSIONS 12/03/2020  Clinical Impression Patient presents with mild pharyngeal and cervical esophageal dysphagia but overall mild risk for aspiration. Oral stage is within normal limits, with adequate oral control and manipulation, rotary mastication and anterior to posterior transfer. Swallow initiation is delayed to the level of the pyriform sinuses with liquids, valleculae with solids. Pharyngeal stage with adequate tongue base retraction, pharyngeal constriction and hyolaryngeal excursion, however protrusion of ACDF hardware (C3-4) alters the pharyngeal space and leads to reduced clearance of all consistencies, with moderate residue remaining at the level of the valleculae/pyriform. Patient compensates for this, without cues, by maintaining airway closure and initiating a second swallow for all boluses; this is effective for pharyngeal clearance. Prominent cricopharyngeus, as well as ACDF hardware noted C6-7. Cervical esophageal phase with mild residue of solids and small amount of backflow with nectar thick liquids. Patient was educated using video teachback re: findings, and written handout of recommendations provided. Continue regular diet with thin liquids, continue using 2 swallows per bite/ sip, and general aspiration precautions: upright posture, slow rate, small bites and sips. At this time no further skilled ST is indicated.  SLP Visit Diagnosis Dysphagia,  pharyngeal phase (R13.13)  Attention and concentration deficit following --  Frontal lobe and executive function deficit following --  Impact on safety and function Mild aspiration risk      CHL IP TREATMENT RECOMMENDATION 12/03/2020  Treatment Recommendations No treatment recommended at this time     Prognosis 12/03/2020  Prognosis for Safe Diet Advancement Good  Barriers to Reach Goals --  Barriers/Prognosis Comment --    CHL IP DIET RECOMMENDATION 12/03/2020  SLP Diet Recommendations Regular solids;Thin liquid  Liquid Administration via Cup  Medication Administration Whole meds with liquid  Compensations Slow rate;Small sips/bites;Follow solids with liquid;Multiple dry swallows after each bite/sip  Postural Changes Seated upright at 90 degrees;Remain semi-upright after after feeds/meals (Comment)      CHL IP OTHER RECOMMENDATIONS 12/03/2020  Recommended Consults --  Oral Care Recommendations Oral care BID  Other Recommendations --      CHL IP FOLLOW UP RECOMMENDATIONS 12/03/2020  Follow up Recommendations None      No flowsheet  data found.         CHL IP ORAL PHASE 12/03/2020  Oral Phase WFL  Oral - Pudding Teaspoon --  Oral - Pudding Cup --  Oral - Honey Teaspoon --  Oral - Honey Cup --  Oral - Nectar Teaspoon --  Oral - Nectar Cup --  Oral - Nectar Straw --  Oral - Thin Teaspoon --  Oral - Thin Cup --  Oral - Thin Straw --  Oral - Puree --  Oral - Mech Soft --  Oral - Regular --  Oral - Multi-Consistency --  Oral - Pill --  Oral Phase - Comment --    CHL IP PHARYNGEAL PHASE 12/03/2020  Pharyngeal Phase Impaired  Pharyngeal- Pudding Teaspoon --  Pharyngeal --  Pharyngeal- Pudding Cup --  Pharyngeal --  Pharyngeal- Honey Teaspoon --  Pharyngeal --  Pharyngeal- Honey Cup --  Pharyngeal --  Pharyngeal- Nectar Teaspoon --  Pharyngeal --  Pharyngeal- Nectar Cup Pharyngeal residue - valleculae;Pharyngeal residue - posterior pharnyx;Delayed swallow  initiation-pyriform sinuses  Pharyngeal Material does not enter airway  Pharyngeal- Nectar Straw --  Pharyngeal --  Pharyngeal- Thin Teaspoon --  Pharyngeal --  Pharyngeal- Thin Cup Delayed swallow initiation-pyriform sinuses;Pharyngeal residue - valleculae;Pharyngeal residue - posterior pharnyx  Pharyngeal Material does not enter airway  Pharyngeal- Thin Straw --  Pharyngeal --  Pharyngeal- Puree Delayed swallow initiation-vallecula;Pharyngeal residue - valleculae  Pharyngeal Material does not enter airway  Pharyngeal- Mechanical Soft Delayed swallow initiation-vallecula;Pharyngeal residue - valleculae  Pharyngeal Material does not enter airway  Pharyngeal- Regular --  Pharyngeal --  Pharyngeal- Multi-consistency --  Pharyngeal --  Pharyngeal- Pill --  Pharyngeal --  Pharyngeal Comment --     CHL IP CERVICAL ESOPHAGEAL PHASE 12/03/2020  Cervical Esophageal Phase Impaired  Pudding Teaspoon --  Pudding Cup --  Honey Teaspoon --  Honey Cup --  Nectar Teaspoon --  Nectar Cup Prominent cricopharyngeal segment;Esophageal backflow into the pharynx  Nectar Straw --  Thin Teaspoon --  Thin Cup --  Thin Straw --  Puree --  Mechanical Soft Prominent cricopharyngeal segment  Regular --  Multi-consistency --  Pill --  Cervical Esophageal Comment --    Deneise Lever, MS, CCC-SLP Speech-Language Pathologist  Aliene Altes 12/03/2020, 3:08 PM                          Oropharyngeal dysphagia - Plan: DG SWALLOW FUNC SPEECH PATH, DG SWALLOW FUNC SPEECH PATH        Problem List Patient Active Problem List   Diagnosis Date Noted  . Elevated hemoglobin A1c 06/19/2020  . Hypothyroidism, acquired 05/30/2019  . Squamous cell lung cancer, right ([place]) 05/30/2019  . HFrEF (heart failure with reduced ejection fraction) ([place]) 03/06/2019  . Atherosclerosis 12/14/2018  . Non-small cell lung cancer, right ([place]) 04/24/2018  . Oral ulcer 02/16/2018  . 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 Migraine headache 03/07/2017  . CAP (community acquired pneumonia) 09/11/2016  . COPD exacerbation ([place]) 09/11/2016  . Hyponatremia 09/11/2016  . Leukocytosis 09/11/2016  . Thrombocytopenia ([place]) 09/11/2016  . Sepsis ([place]) 09/09/2016  . Cigarette smoker 06/11/2016  . Cervical radiculopathy 04/15/2016  . 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 Impingement syndrome of right shoulder 03/09/2016  . Personal history of tobacco use, presenting hazards to health 03/05/2016  . Health care maintenance 09/29/2015  . 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  . Benign essential hypertension 05/28/2015  .  Polycythemia 03/24/2015  . CAD (coronary artery disease) 12/12/2014  . Carotid artery disease ([place]) 12/12/2014  . Disequilibrium 12/12/2014  . Mixed hyperlipidemia 12/10/2014  . 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 headache (migraine equivalents) 08/12/2014  . Incomplete emptying of bladder 06/04/2014  . Anxiety 05/18/2014  . Chronic coronary artery disease 05/18/2014  . Chronic headaches 05/18/2014  . Acute shoulder pain 03/15/2014  . Impingement syndrome of left shoulder 03/15/2014  . Lung mass 12/06/2013  . Kidney stone 11/24/2013  . COPD (chronic obstructive pulmonary disease) ([place]) 04/24/2013  . Tobacco use disorder 04/24/2013  . Obstructive sleep apnea 04/24/2013  . Benign localized prostatic hyperplasia with lower urinary tract symptoms (LUTS) 07/04/2012  . Encounter for long-term current use of medication 07/04/2012  . ED (erectile dysfunction) of organic origin 07/04/2012  . Testicular hypofunction 07/04/2012    Aliene Altes 12/03/2020, 3:06 PM  [facility] [place], [place], [address] Phone: [phone]   Fax:     Name: ARIANA CAVENAUGH MRN: [id number] Date of Birth: [birth year]

## 2020-12-04 ENCOUNTER — Inpatient Hospital Stay: Payer: BC Managed Care – PPO

## 2020-12-04 ENCOUNTER — Other Ambulatory Visit: Payer: BC Managed Care – PPO

## 2020-12-04 ENCOUNTER — Telehealth: Payer: Self-pay | Admitting: Internal Medicine

## 2020-12-04 NOTE — Telephone Encounter (Signed)
[employer] discount card has been placed up front for pick up.  Patient is aware and voiced her understanding.  Nothing further needed.

## 2020-12-20 ENCOUNTER — Other Ambulatory Visit: Payer: Self-pay | Admitting: Urology

## 2020-12-20 ENCOUNTER — Other Ambulatory Visit: Payer: Self-pay | Admitting: Internal Medicine

## 2020-12-20 DIAGNOSIS — J449 Chronic obstructive pulmonary disease, unspecified: Secondary | ICD-10-CM

## 2020-12-20 NOTE — Telephone Encounter (Signed)
This will need to be refilled on Monday after Dr. [name] reviews.  Patient is not my patient.  Routine refills are not done after hours.

## 2020-12-22 ENCOUNTER — Telehealth: Payer: Self-pay | Admitting: *Deleted

## 2020-12-22 NOTE — Telephone Encounter (Signed)
Pt. called and stated that he needed refills on his Mybetriq. He was last seen by Dr. Bernardo Heater in Oct. 2021. Please call @ [phone]  Note from [place] .   Looked in chart medication already refilled. JQ

## 2021-01-26 ENCOUNTER — Other Ambulatory Visit: Payer: Self-pay | Admitting: Licensed Clinical Social Worker

## 2021-01-26 ENCOUNTER — Ambulatory Visit
Admission: RE | Admit: 2021-01-26 | Discharge: 2021-01-26 | Disposition: A | Discharge status: Home / Self Care | Payer: BC Managed Care – PPO | Source: Ambulatory Visit | Attending: Radiation Oncology | Admitting: Radiation Oncology

## 2021-01-26 VITALS — BP 142/85 | HR 101 | Temp 96.6°F | Resp 16 | Wt 207.6 lb

## 2021-01-26 DIAGNOSIS — R519 Headache, unspecified: Secondary | ICD-10-CM | POA: Diagnosis not present

## 2021-01-26 DIAGNOSIS — C342 Malignant neoplasm of middle lobe, bronchus or lung: Secondary | ICD-10-CM | POA: Insufficient documentation

## 2021-01-26 DIAGNOSIS — R42 Dizziness and giddiness: Secondary | ICD-10-CM | POA: Diagnosis not present

## 2021-01-26 NOTE — Progress Notes (Signed)
mri

## 2021-01-26 NOTE — Progress Notes (Signed)
Radiation Oncology Follow up Note  Name: Paul Carpenter   Date:   01/26/2021 MRN:  [id number] DOB: August 04, 1956    This 65 y.o. male presents to the clinic today for 3-year follow-up status post concurrent chemoradiation therapy for stage IIIa non-small cell lung cancer of the right middle lobe.  REFERRING PROVIDER: Kirk Ruths, MD  HPI: Patient is a 65 year old male now out close to 3 years having completed concurrent chemoradiation therapy for stage IIIa non-small cell lung cancer of the right middle lobe.Marland Kitchen  His last CT scan back in January showed no significant change in right apical cavitary lesion with surrounding nodular soft tissue thickening for which continued surveillance was recommended.  There is unchanged 4.5 mm left upper lobe pulmonary nodules no new mild knowledgeable or masses were noted.  He has been complaining of some dizziness and persistent headaches for which he has been taking Advil.  No focal neurologic deficits are reported.  COMPLICATIONS OF TREATMENT: none  FOLLOW UP COMPLIANCE: keeps appointments   PHYSICAL EXAM:  BP (!) 142/85   Pulse (!) 101   Temp (!) 96.6 F (35.9 C)   Resp 16   Wt 207 lb 9.6 oz (94.2 kg)   SpO2 98%   BMI 28.16 kg/m  Well-developed well-nourished patient in NAD. HEENT reveals PERLA, EOMI, discs not visualized.  Oral cavity is clear. No oral mucosal lesions are identified. Neck is clear without evidence of cervical or supraclavicular adenopathy. Lungs are clear to A&P. Cardiac examination is essentially unremarkable with regular rate and rhythm without murmur rub or thrill. Abdomen is benign with no organomegaly or masses noted. Motor sensory and DTR levels are equal and symmetric in the upper and lower extremities. Cranial nerves II through XII are grossly intact. Proprioception is intact. No peripheral adenopathy or edema is identified. No motor or sensory levels are noted. Crude visual fields are within normal range.  RADIOLOGY  RESULTS: Previous CT scan reviewed MRI scan of brain ordered  PLAN: At this time I am concerned about his neurologic symptoms.  I have ordered an MRI scan of his brain to rule out brain metastasis.  We will see the patient back should there be brain metastasis will make further recommendations at that time.  Otherwise have asked to see him back in 3 to 4 months for follow-up.  Patient continues follow-up care with medical oncology.  He knows to call with any concerns.  I would like to take this opportunity to thank you for allowing me to participate in the care of your patient.[name], MD

## 2021-01-28 ENCOUNTER — Ambulatory Visit
Admission: RE | Admit: 2021-01-28 | Discharge: 2021-01-28 | Disposition: A | Discharge status: Home / Self Care | Payer: BC Managed Care – PPO | Source: Ambulatory Visit | Attending: Radiation Oncology | Admitting: Radiation Oncology

## 2021-01-28 ENCOUNTER — Other Ambulatory Visit: Payer: Self-pay

## 2021-01-28 DIAGNOSIS — C342 Malignant neoplasm of middle lobe, bronchus or lung: Secondary | ICD-10-CM | POA: Diagnosis present

## 2021-01-28 MED ORDER — GADOBUTROL 1 MMOL/ML IV SOLN
9.0000 mL | Freq: Once | INTRAVENOUS | Status: AC | PRN
  Administered 2021-01-28: 9 mL via INTRAVENOUS

## 2021-01-29 ENCOUNTER — Inpatient Hospital Stay: Payer: BC Managed Care – PPO | Attending: Oncology

## 2021-01-29 DIAGNOSIS — Z95828 Presence of other vascular implants and grafts: Secondary | ICD-10-CM

## 2021-01-29 DIAGNOSIS — C342 Malignant neoplasm of middle lobe, bronchus or lung: Secondary | ICD-10-CM | POA: Insufficient documentation

## 2021-01-29 DIAGNOSIS — Z452 Encounter for adjustment and management of vascular access device: Secondary | ICD-10-CM | POA: Insufficient documentation

## 2021-01-29 MED ORDER — SODIUM CHLORIDE 0.9% FLUSH
10.0000 mL | INTRAVENOUS | Status: DC | PRN
  Administered 2021-01-29: 10 mL via INTRAVENOUS
  Filled 2021-01-29: qty 10

## 2021-01-29 MED ORDER — HEPARIN SOD (PORK) LOCK FLUSH 100 UNIT/ML IV SOLN
500.0000 [IU] | Freq: Once | INTRAVENOUS | Status: AC
  Administered 2021-01-29: 500 [IU] via INTRAVENOUS
  Filled 2021-01-29: qty 5

## 2021-03-26 ENCOUNTER — Inpatient Hospital Stay: Payer: BC Managed Care – PPO | Attending: Oncology

## 2021-03-26 DIAGNOSIS — Z95828 Presence of other vascular implants and grafts: Secondary | ICD-10-CM

## 2021-03-26 DIAGNOSIS — Z452 Encounter for adjustment and management of vascular access device: Secondary | ICD-10-CM | POA: Diagnosis present

## 2021-03-26 DIAGNOSIS — C342 Malignant neoplasm of middle lobe, bronchus or lung: Secondary | ICD-10-CM | POA: Insufficient documentation

## 2021-03-26 MED ORDER — HEPARIN SOD (PORK) LOCK FLUSH 100 UNIT/ML IV SOLN
INTRAVENOUS | Status: AC
  Filled 2021-03-26: qty 5

## 2021-03-26 MED ORDER — SODIUM CHLORIDE 0.9% FLUSH
10.0000 mL | INTRAVENOUS | Status: DC | PRN
  Administered 2021-03-26: 10 mL via INTRAVENOUS
  Filled 2021-03-26: qty 10

## 2021-03-26 MED ORDER — HEPARIN SOD (PORK) LOCK FLUSH 100 UNIT/ML IV SOLN
500.0000 [IU] | Freq: Once | INTRAVENOUS | Status: AC
  Administered 2021-03-26: 500 [IU] via INTRAVENOUS
  Filled 2021-03-26: qty 5

## 2021-04-21 ENCOUNTER — Encounter: Payer: Self-pay | Admitting: Internal Medicine

## 2021-04-21 ENCOUNTER — Ambulatory Visit: Payer: BC Managed Care – PPO | Admitting: Internal Medicine

## 2021-04-21 ENCOUNTER — Other Ambulatory Visit: Payer: Self-pay

## 2021-04-21 VITALS — BP 130/80 | HR 85 | Temp 98.2°F | Ht 72.0 in | Wt 203.0 lb

## 2021-04-21 DIAGNOSIS — Z720 Tobacco use: Secondary | ICD-10-CM

## 2021-04-21 DIAGNOSIS — F17210 Nicotine dependence, cigarettes, uncomplicated: Secondary | ICD-10-CM | POA: Diagnosis not present

## 2021-04-21 DIAGNOSIS — R042 Hemoptysis: Secondary | ICD-10-CM

## 2021-04-21 DIAGNOSIS — J449 Chronic obstructive pulmonary disease, unspecified: Secondary | ICD-10-CM

## 2021-04-21 DIAGNOSIS — R911 Solitary pulmonary nodule: Secondary | ICD-10-CM | POA: Diagnosis not present

## 2021-04-21 DIAGNOSIS — G4733 Obstructive sleep apnea (adult) (pediatric): Secondary | ICD-10-CM

## 2021-04-21 DIAGNOSIS — J9611 Chronic respiratory failure with hypoxia: Secondary | ICD-10-CM

## 2021-04-21 MED ORDER — AZITHROMYCIN 250 MG PO TABS: ORAL_TABLET | ORAL | 0 refills | Status: DC

## 2021-04-21 MED ORDER — TRELEGY ELLIPTA 200-62.5-25 MCG/INH IN AEPB: 20.0000 ug | INHALATION_SPRAY | Freq: Every day | RESPIRATORY_TRACT | 0 refills | Status: DC

## 2021-04-21 MED ORDER — TRELEGY ELLIPTA 200-62.5-25 MCG/INH IN AEPB: 200.0000 ug | INHALATION_SPRAY | Freq: Every day | RESPIRATORY_TRACT | 0 refills | Status: DC

## 2021-04-21 MED ORDER — TRELEGY ELLIPTA 200-62.5-25 MCG/INH IN AEPB: 200.0000 | INHALATION_SPRAY | RESPIRATORY_TRACT | 5 refills | Status: AC

## 2021-04-21 NOTE — Progress Notes (Signed)
[facility] Consultation      MRN# [id number] Paul Carpenter 1955-11-02     Brief History: Synopsis: Paul Carpenter first saw the [facility] pulmonary clinic in July of 2014 for COPD. Simple spirometry showed a ratio 62%, FEV1 of 2.41 L (60% predicted). He had smoked 2 packs of cigarettes daily since his teenage years and continues to smoke. He has had multiple sinus infections and 2 sinus surgeries in the past.. LUL lung lesion negative for malignancy based on ENB 06/2016. 04/2017 PFT's shows ratio 62 with Fev1 68% 2.6L with air trapping and hyperinflation  Mass of upper lobe of left lung ENB of LUL lesion negative for malignant cell in 06/2016. Repeat CT chest in 08/2016 with RUL small scarring lesion, this is new.  Had another CTA Chest 09/09/16 for severe dyspnea - no PE, but LUL PNA CT chest 03/2017- -Apical right upper lobe 5 mm pulmonary nodule stable back to 03/14/2015 - 6 mm apical right upper lobe pulmonary nodule stable since at least 03/22/2016.  -tree-in-bud type opacity in the anterior right middle lobe is stable since 08/23/2016 -Apical left upper lobe 9 x 5 mm  nodule is stable since at least 03/22/2016  - clinical monitoring at this time-follow up CT chest in 6-12 months  CT chest Mar 23, 2019  CT scan results from Mar 23, 2019 reviewed independently with stable disease and no evidence of recurrence or progression.    MRI of the brain completed on Mar 28, 2018 reviewed independently did not reveal metastatic disease CHF: Patient has an EF of 40%.  Continue evaluation and treatment per cardiology    CC: Follow-up COPD   HPI PATIENT HAS NOT FOLLOWED UP IN SEVERAL YEARS AND NOW HAS HEMOPTYSIS  CT chest 11/2020 CT of the chest independently reviewed today with the patient 1. No significant change in the right apical cavitary lesion with surrounding nodular soft tissue thickening, for which continued surveillance is recommended. 2.  Unchanged size of 4.5 mm left upper lobe pulmonary nodule. No new pulmonary nodules or masses. 3. Stable small bilateral hilar lymph nodes. 4. Emphysema and aortic atherosclerosis.  Repeat CT chest is pending on July 21     Patient continues to smoke  smoking cessation counseling provided Patient has had increased wheezing and shortness of breath and dyspnea on exertion Patient was started on prednisone taper therapy by her primary care physician At this time patient will also need antibiotics   + Exacerbation at this time +evidence hemoptysis   Paul Carpenter was positive for hypoxia-patient is supposed to wear oxygen at nighttime but is noncompliant  Patient has diagnosis of sleep apnea and says that he is tired during the day He does not wear his CPAP, therefore is noncompliant with CPAP therapy  Patient continues to smoke  Smoking Assessment and Cessation Counseling Upon further questioning, Patient smokes 2 ppd I have advised patient to quit/stop smoking as soon as possible due to high risk for multiple medical problems  Patient  is NOT willing to quit smoking  I have advised patient that we can assist and have options of Nicotine replacement therapy. I also advised patient on behavioral therapy and can provide oral medication therapy in conjunction with the other therapies Follow up next Office visit  for assessment of smoking cessation Smoking cessation counseling advised for 4 minutes    Patient also history of a cardiomyopathy with an EF of 40% Patient has not seen his cardiologist in a while who is  Dr. Nehemiah Massed    Current Outpatient Medications:    acetaminophen (TYLENOL) 500 MG tablet, Take 500 mg by mouth daily., Disp: , Rfl:    albuterol (PROAIR HFA) 108 (90 Base) MCG/ACT inhaler, Inhale 2 puffs into the lungs every 4 (four) hours as needed for wheezing or shortness of breath., Disp: 1 Inhaler, Rfl: 1   ANORO ELLIPTA 62.5-25 MCG/INH AEPB, INHALE 1 PUFF BY MOUTH EVERY  DAY, Disp: 60 each, Rfl: 10   atorvastatin (LIPITOR) 40 MG tablet, Take 40 mg by mouth at bedtime. , Disp: , Rfl:    busPIRone (BUSPAR) 15 MG tablet, Take 15 mg by mouth daily. Takes 0.5mg  daily, Disp: , Rfl: 10   diazepam (VALIUM) 5 MG tablet, Take 2 tablets (10 mg total) by mouth every 12 (twelve) hours as needed for anxiety. (Patient taking differently: Take 5 mg by mouth every 8 (eight) hours as needed for anxiety.), Disp: 60 tablet, Rfl: 0   DULoxetine (CYMBALTA) 60 MG capsule, Take 1 capsule by mouth 1 day or 1 dose., Disp: , Rfl:    fluticasone (FLONASE) 50 MCG/ACT nasal spray, Place 1 spray into both nostrils daily as needed (for allergies.). , Disp: , Rfl:    ipratropium-albuterol (DUONEB) 0.5-2.5 (3) MG/3ML SOLN, Take 3 mLs by nebulization every 6 (six) hours. (Patient taking differently: Take 3 mLs by nebulization as needed.), Disp: 360 mL, Rfl: 5   levothyroxine (SYNTHROID) 150 MCG tablet, Take 150 mcg by mouth daily before breakfast., Disp: , Rfl:    lidocaine (XYLOCAINE) 2 % solution, , Disp: , Rfl: 2   lidocaine-prilocaine (EMLA) cream, Apply 1 application topically as needed., Disp: 30 g, Rfl: 1   metoprolol succinate (TOPROL-XL) 25 MG 24 hr tablet, Take 25 mg by mouth daily., Disp: , Rfl:    Multiple Vitamin (MULTIVITAMIN WITH MINERALS) TABS tablet, Take 1 tablet by mouth daily. , Disp: , Rfl:    MYRBETRIQ 50 MG TB24 tablet, TAKE 1 TABLET BY MOUTH EVERY DAY, Disp: 30 tablet, Rfl: 3   nitroGLYCERIN (NITROSTAT) 0.4 MG SL tablet, Place 0.4 mg under the tongue every 5 (five) minutes as needed for chest pain. , Disp: , Rfl:    ondansetron (ZOFRAN) 8 MG tablet, Take 8 mg by mouth every 8 (eight) hours as needed for nausea or vomiting. (Patient not taking: Reported on 11/20/2020), Disp: , Rfl:    OXYGEN, Inhale 2 L into the lungs at bedtime.  (Patient not taking: Reported on 11/20/2020), Disp: , Rfl:    potassium chloride (K-DUR) 10 MEQ tablet, TAKE 1 TABLET (10 MEQ TOTAL) BY MOUTH ONCE  DAILY., Disp: , Rfl: 3   propranolol (INNOPRAN XL) 80 MG 24 hr capsule, Take by mouth., Disp: , Rfl:  No current facility-administered medications for this visit.  Facility-Administered Medications Ordered in Other Visits:    heparin lock flush 100 unit/mL, 500 Units, Intravenous, Once, [name], Kathlene November, MD   Review of Systems:  Gen:  Denies  fever, sweats, chills weight loss  HEENT: Denies blurred vision, double vision, ear pain, eye pain, hearing loss, nose bleeds, sore throat Cardiac:  No dizziness, chest pain or heaviness, chest tightness,edema, No JVD Resp:   + cough, +sputum production, +shortness of breath,+wheezing, +hemoptysis,  Gi: Denies swallowing difficulty, stomach pain, nausea or vomiting, diarrhea, constipation, bowel incontinence Other:  All other systems negative   BP 130/80 (BP Location: Left Arm, Patient Position: Sitting, Cuff Size: Normal)   Pulse 85   Temp 98.2 F (36.8 C) (Oral)  Ht 6' (1.829 m)   Wt 203 lb (92.1 kg)   SpO2 97%   BMI 27.53 kg/m   Physical Examination:   General Appearance: No distress  Neuro:without focal findings,  speech normal,  HEENT: PERRLA, EOM intact.   Pulmonary: normal breath sounds, +wheezing.  CardiovascularNormal S1,S2.  No m/r/g.   Abdomen: Benign, Soft, non-tender. Renal:  No costovertebral tenderness  GU:  Not performed at this time. Endoc: No evident thyromegaly Skin:   warm, no rashes, no ecchymosis  Extremities: normal, no cyanosis, clubbing. PSYCHIATRIC: Mood, affect within normal limits.   ALL OTHER ROS ARE NEGATIVE            Assessment and Plan:    65 year old pleasant white male seen today for moderate to severe COPD Gold stage D with chronic hypoxic respiratory failure from his COPD with a history of non-small cell lung cancer with ongoing tobacco abuse status post chemo and radiation therapy   Patient has ongoing shortness of breath and dyspnea exertion now with episodic hemoptysis with  ongoing tobacco abuse     Moderate COPD Gold stage D At this time I think switching inhalers would be appropriate We will start Trelegy 200 triple therapy inhaler therapy  Prescription provided  Recommend stopping Anoro  Continue albuterol as needed   COPD exacerbation  Continue prednisone taper as prescribed by primary care physician  Add Z-Pak to regimen   Smoking cessation strongly advised  Counseling provided   History of metastatic non-small cell lung cancer  Patient is to have a repeat CT chest on July 21  Assessment by rate unk and oncology pending  Previous CT scan in January 2022 did not show any progression of disease   Ischemic cardiomyopathy with EF 40% Patient advised to follow-up with cardiology  Patient has dizziness and headaches Recommend following up with neurology as previously seen  Chronic hypoxic respiratory failure from COPD Patient needs this for survival however patient is noncompliant with therapy  OSA Patient refuses to use CPAP at night therefore is noncompliant   MEDICATION ADJUSTMENTS/LABS AND TESTS ORDERED: Trelegy INH ALB AS NEEDED PLEASE STOP SMOKING FOLLOW UP CT CHEST  CURRENT MEDICATIONS REVIEWED AT LENGTH WITH PATIENT TODAY   Follow up in 6 months  Total time spent 47 minutes   Phillip Maffei [name], M.D.  [facility]  Medical Director [place] Director [facility] Cardio-Pulmonary Department             I

## 2021-04-21 NOTE — Patient Instructions (Signed)
Patient to start Trelegy 200 inhaler therapy Use albuterol nebulizers as needed Continue prednisone as prescribed Start Z-Pak

## 2021-05-01 ENCOUNTER — Ambulatory Visit
Admission: RE | Admit: 2021-05-01 | Discharge: 2021-05-01 | Disposition: A | Discharge status: Home / Self Care | Payer: BC Managed Care – PPO | Source: Ambulatory Visit | Attending: Radiation Oncology | Admitting: Radiation Oncology

## 2021-05-01 ENCOUNTER — Encounter: Payer: Self-pay | Admitting: Radiation Oncology

## 2021-05-01 VITALS — BP 143/102 | HR 79 | Temp 96.7°F | Resp 18 | Wt 200.6 lb

## 2021-05-01 DIAGNOSIS — C61 Malignant neoplasm of prostate: Secondary | ICD-10-CM | POA: Insufficient documentation

## 2021-05-01 DIAGNOSIS — C342 Malignant neoplasm of middle lobe, bronchus or lung: Secondary | ICD-10-CM

## 2021-05-04 ENCOUNTER — Other Ambulatory Visit: Payer: Self-pay | Admitting: Urology

## 2021-05-20 ENCOUNTER — Ambulatory Visit
Admission: RE | Admit: 2021-05-20 | Discharge: 2021-05-20 | Disposition: A | Discharge status: Home / Self Care | Payer: BC Managed Care – PPO | Source: Ambulatory Visit | Attending: Oncology | Admitting: Oncology

## 2021-05-20 ENCOUNTER — Other Ambulatory Visit: Payer: Self-pay

## 2021-05-20 DIAGNOSIS — C3491 Malignant neoplasm of unspecified part of right bronchus or lung: Secondary | ICD-10-CM | POA: Diagnosis present

## 2021-05-20 LAB — POCT I-STAT CREATININE: Creatinine, Ser: 0.7 mg/dL (ref 0.61–1.24)

## 2021-05-20 MED ORDER — IOHEXOL 350 MG/ML SOLN
75.0000 mL | Freq: Once | INTRAVENOUS | Status: AC | PRN
  Administered 2021-05-20: 75 mL via INTRAVENOUS

## 2021-05-21 ENCOUNTER — Ambulatory Visit: Admission: RE | Admit: 2021-05-21 | Payer: BC Managed Care – PPO | Source: Ambulatory Visit

## 2021-05-21 ENCOUNTER — Inpatient Hospital Stay: Payer: BC Managed Care – PPO | Attending: Oncology

## 2021-05-21 DIAGNOSIS — E871 Hypo-osmolality and hyponatremia: Secondary | ICD-10-CM | POA: Insufficient documentation

## 2021-05-21 DIAGNOSIS — I110 Hypertensive heart disease with heart failure: Secondary | ICD-10-CM | POA: Insufficient documentation

## 2021-05-21 DIAGNOSIS — Z923 Personal history of irradiation: Secondary | ICD-10-CM | POA: Diagnosis not present

## 2021-05-21 DIAGNOSIS — Z79899 Other long term (current) drug therapy: Secondary | ICD-10-CM | POA: Diagnosis not present

## 2021-05-21 DIAGNOSIS — I509 Heart failure, unspecified: Secondary | ICD-10-CM | POA: Insufficient documentation

## 2021-05-21 DIAGNOSIS — C3491 Malignant neoplasm of unspecified part of right bronchus or lung: Secondary | ICD-10-CM

## 2021-05-21 DIAGNOSIS — E039 Hypothyroidism, unspecified: Secondary | ICD-10-CM | POA: Diagnosis not present

## 2021-05-21 DIAGNOSIS — Z95828 Presence of other vascular implants and grafts: Secondary | ICD-10-CM

## 2021-05-21 DIAGNOSIS — F419 Anxiety disorder, unspecified: Secondary | ICD-10-CM | POA: Insufficient documentation

## 2021-05-21 DIAGNOSIS — C342 Malignant neoplasm of middle lobe, bronchus or lung: Secondary | ICD-10-CM | POA: Insufficient documentation

## 2021-05-21 LAB — CBC WITH DIFFERENTIAL/PLATELET
Abs Immature Granulocytes: 0.02 10*3/uL (ref 0.00–0.07)
Basophils Absolute: 0 10*3/uL (ref 0.0–0.1)
Basophils Relative: 1 %
Eosinophils Absolute: 0.1 10*3/uL (ref 0.0–0.5)
Eosinophils Relative: 2 %
HCT: 40.8 % (ref 39.0–52.0)
Hemoglobin: 14.6 g/dL (ref 13.0–17.0)
Immature Granulocytes: 0 %
Lymphocytes Relative: 15 %
Lymphs Abs: 1 10*3/uL (ref 0.7–4.0)
MCH: 34 pg (ref 26.0–34.0)
MCHC: 35.8 g/dL (ref 30.0–36.0)
MCV: 95.1 fL (ref 80.0–100.0)
Monocytes Absolute: 0.7 10*3/uL (ref 0.1–1.0)
Monocytes Relative: 11 %
Neutro Abs: 4.7 10*3/uL (ref 1.7–7.7)
Neutrophils Relative %: 71 %
Platelets: 304 10*3/uL (ref 150–400)
RBC: 4.29 MIL/uL (ref 4.22–5.81)
RDW: 12.1 % (ref 11.5–15.5)
WBC: 6.5 10*3/uL (ref 4.0–10.5)
nRBC: 0 % (ref 0.0–0.2)

## 2021-05-21 LAB — COMPREHENSIVE METABOLIC PANEL
ALT: 21 U/L (ref 0–44)
AST: 24 U/L (ref 15–41)
Albumin: 4 g/dL (ref 3.5–5.0)
Alkaline Phosphatase: 113 U/L (ref 38–126)
Anion gap: 10 (ref 5–15)
BUN: 7 mg/dL — ABNORMAL LOW (ref 8–23)
CO2: 25 mmol/L (ref 22–32)
Calcium: 8.8 mg/dL — ABNORMAL LOW (ref 8.9–10.3)
Chloride: 92 mmol/L — ABNORMAL LOW (ref 98–111)
Creatinine, Ser: 0.69 mg/dL (ref 0.61–1.24)
GFR, Estimated: >60 mL/min (ref >60)
Glucose, Bld: 98 mg/dL (ref 70–99)
Potassium: 3.9 mmol/L (ref 3.5–5.1)
Sodium: 127 mmol/L — ABNORMAL LOW (ref 135–145)
Total Bilirubin: 0.5 mg/dL (ref 0.3–1.2)
Total Protein: 6.8 g/dL (ref 6.5–8.1)

## 2021-05-21 MED ORDER — HEPARIN SOD (PORK) LOCK FLUSH 100 UNIT/ML IV SOLN
INTRAVENOUS | Status: AC
  Filled 2021-05-21: qty 5

## 2021-05-21 MED ORDER — HEPARIN SOD (PORK) LOCK FLUSH 100 UNIT/ML IV SOLN
500.0000 [IU] | Freq: Once | INTRAVENOUS | Status: AC
  Administered 2021-05-21: 500 [IU] via INTRAVENOUS
  Filled 2021-05-21: qty 5

## 2021-05-21 MED ORDER — SODIUM CHLORIDE 0.9% FLUSH
10.0000 mL | Freq: Once | INTRAVENOUS | Status: AC
  Administered 2021-05-21: 10 mL via INTRAVENOUS
  Filled 2021-05-21: qty 10

## 2021-05-25 NOTE — Progress Notes (Signed)
Paul Carpenter  [phone] [phone]  ID: Paul Carpenter OB: September 20, 1956  MR#: [id number]  GHW#:[id number]  Patient Care Team: Kirk Ruths, MD as PCP - General (Internal Medicine) Lucky Cowboy, Erskine Squibb, MD as Referring Physician (Vascular Surgery) [name], MD as Consulting Physician (Oncology) [name], MD as Referring Physician (Radiation Oncology)   CHIEF COMPLAINT: Stage IIIa non-small cell lung cancer, right middle lobe.  INTERVAL HISTORY: Patient returns to clinic today for further evaluation and discussion of his imaging results.  He is anxious, but otherwise feels well.  His wife reports he has had 2 "severe" episodes of bronchitis over the past several months.  He continues to have occasional nausea.  He does not complain of cough today.  He continues to smoke.  He has no neurologic complaints.  He denies any recent fevers or illnesses.  He denies any chest pain, shortness of breath, or hemoptysis. He denies any vomiting, constipation, or diarrhea. He has no urinary complaints.  Patient offers no further specific complaints today.  REVIEW OF SYSTEMS:   Review of Systems  Constitutional: Negative.  Negative for fever, malaise/fatigue and weight loss.  HENT: Negative.  Negative for congestion and sore throat.   Respiratory: Negative.  Negative for cough, hemoptysis and shortness of breath.   Cardiovascular: Negative.  Negative for chest pain and leg swelling.  Gastrointestinal:  Positive for nausea. Negative for abdominal pain, blood in stool and melena.  Genitourinary: Negative.  Negative for dysuria.  Musculoskeletal: Negative.  Negative for joint pain.  Skin: Negative.  Negative for rash.  Neurological: Negative.  Negative for dizziness, sensory change, focal weakness, weakness and headaches.  Psychiatric/Behavioral:  The patient is nervous/anxious.    As per HPI. Otherwise, a complete review of systems is  negative.  PAST MEDICAL HISTORY: Past Medical History:  Diagnosis Date   Anginal pain ([place])    Anxiety    Asthma    Chest pain    CHF (congestive heart failure) (HCC)    Chicken pox    Complication of anesthesia    o2 dropped after neck fusion   COPD (chronic obstructive pulmonary disease) (HCC)    Coronary artery disease    Cough    chronic  clear phlegm   Dysrhythmia    palpitations   GERD (gastroesophageal reflux disease)    h/o reflux/ hoarsness   Hematochezia    Hemorrhoids    History of chickenpox    History of colon polyps    History of Helicobacter pylori infection    Hoarseness    Hypertension    Lung cancer ([place]) 05/2016   Chemo + rad tx's.    Migraines    OSA (obstructive sleep apnea)    has CPAP but does not use   Personal history of tobacco use, presenting hazards to health 03/05/2016   Pneumonia    5/17   Raynaud disease    Raynaud disease    Raynaud's disease    Rotator cuff tear    on right   Shortness of breath dyspnea    Sleep apnea    Ulcer (traumatic) of oral mucosa     PAST SURGICAL HISTORY: Past Surgical History:  Procedure Laterality Date   BACK SURGERY     cervical fusion x 2   CARDIAC CATHETERIZATION     CERVICAL DISCECTOMY     x 2   COLONOSCOPY     COLONOSCOPY N/A 07/25/2015   Procedure: COLONOSCOPY;  Surgeon: Lollie Sails,  MD;  Location: [facility] ENDOSCOPY;  Service: Endoscopy;  Laterality: N/A;   COLONOSCOPY WITH PROPOFOL N/A 10/04/2017   Procedure: COLONOSCOPY WITH PROPOFOL;  Surgeon: Lollie Sails, MD;  Location: [facility] ENDOSCOPY;  Service: Endoscopy;  Laterality: N/A;   COLONOSCOPY WITH PROPOFOL N/A 07/10/2020   Procedure: COLONOSCOPY WITH PROPOFOL;  Surgeon: Toledo, Benay Pike, MD;  Location: [facility] ENDOSCOPY;  Service: Gastroenterology;  Laterality: N/A;   ELECTROMAGNETIC NAVIGATION BROCHOSCOPY Left 06/28/2016   Procedure: ELECTROMAGNETIC NAVIGATION BRONCHOSCOPY;  Surgeon: Vilinda Boehringer, MD;  Location: [facility] ORS;  Service:  Cardiopulmonary;  Laterality: Left;   ENDOBRONCHIAL ULTRASOUND N/A 04/11/2018   Procedure: ENDOBRONCHIAL ULTRASOUND;  Surgeon: [name], MD;  Location: [facility] ORS;  Service: Cardiopulmonary;  Laterality: N/A;   ESOPHAGOGASTRODUODENOSCOPY N/A 07/25/2015   Procedure: ESOPHAGOGASTRODUODENOSCOPY (EGD);  Surgeon: Lollie Sails, MD;  Location: [facility] ENDOSCOPY;  Service: Endoscopy;  Laterality: N/A;   ESOPHAGOGASTRODUODENOSCOPY (EGD) WITH PROPOFOL N/A 07/10/2020   Procedure: ESOPHAGOGASTRODUODENOSCOPY (EGD) WITH PROPOFOL;  Surgeon: Toledo, Benay Pike, MD;  Location: [facility] ENDOSCOPY;  Service: Gastroenterology;  Laterality: N/A;   NASAL SINUS SURGERY     x 2    PORTA CATH INSERTION N/A 04/24/2018   Procedure: PORTA CATH INSERTION;  Surgeon: Algernon Huxley, MD;  Location: [place] [facility] LAB;  Service: Cardiovascular;  Laterality: N/A;   rotator cuff surgery Right    07/2016   SEPTOPLASTY     SKIN GRAFT      FAMILY HISTORY Family History  Problem Relation Age of Onset   Heart disease Father    Prostate cancer Father    Heart disease Paternal Grandmother    Heart attack Maternal Grandfather 22   Kidney cancer Neg Hx    Bladder Cancer Neg Hx    Other Neg Hx        pituitary abnormality       ADVANCED DIRECTIVES:    HEALTH MAINTENANCE: Social History   Tobacco Use   Smoking status: Every Day    Packs/day: 3.00    Years: 45.00    Pack years: 135.00    Types: Cigarettes   Smokeless tobacco: Never   Tobacco comments:    2 PPD 04/21/2021  Vaping Use   Vaping Use: Never used  Substance Use Topics   Alcohol use: Yes    Alcohol/week: 2.0 standard drinks    Types: 2 Standard drinks or equivalent per week    Comment: moderate   Drug use: No     Allergies  Allergen Reactions   Lisinopril Rash   Varenicline Rash    Current Outpatient Medications  Medication Sig Dispense Refill   acetaminophen (TYLENOL) 500 MG tablet Take 500 mg by mouth daily.     albuterol (PROAIR HFA) 108 (90  Base) MCG/ACT inhaler Inhale 2 puffs into the lungs every 4 (four) hours as needed for wheezing or shortness of breath. 1 Inhaler 1   ANORO ELLIPTA 62.5-25 MCG/INH AEPB INHALE 1 PUFF BY MOUTH EVERY DAY 60 each 10   atorvastatin (LIPITOR) 40 MG tablet Take 40 mg by mouth at bedtime.      busPIRone (BUSPAR) 15 MG tablet Take 15 mg by mouth daily. Takes 0.5mg  daily  10   DULoxetine (CYMBALTA) 60 MG capsule Take 1 capsule by mouth 1 day or 1 dose.     fluticasone (FLONASE) 50 MCG/ACT nasal spray Place 1 spray into both nostrils daily as needed (for allergies.).      Fluticasone-Umeclidin-Vilant (TRELEGY ELLIPTA) 200-62.5-25 MCG/INH AEPB Inhale 20 mcg into the lungs  daily. 28 each 0   Fluticasone-Umeclidin-Vilant (TRELEGY ELLIPTA) 200-62.5-25 MCG/INH AEPB Inhale 200 mcg into the lungs daily. 28 each 0   ipratropium-albuterol (DUONEB) 0.5-2.5 (3) MG/3ML SOLN Take 3 mLs by nebulization every 6 (six) hours. (Patient taking differently: Take 3 mLs by nebulization as needed.) 360 mL 5   levothyroxine (SYNTHROID) 150 MCG tablet Take 150 mcg by mouth daily before breakfast.     lidocaine (XYLOCAINE) 2 % solution   2   lidocaine-prilocaine (EMLA) cream Apply 1 application topically as needed. 30 g 1   LORazepam (ATIVAN) 0.5 MG tablet Take 0.5 mg by mouth 2 (two) times daily as needed.     metoprolol succinate (TOPROL-XL) 25 MG 24 hr tablet Take 25 mg by mouth daily.     Multiple Vitamin (MULTIVITAMIN WITH MINERALS) TABS tablet Take 1 tablet by mouth daily.      MYRBETRIQ 50 MG TB24 tablet TAKE 1 TABLET BY MOUTH EVERY DAY 30 tablet 3   potassium chloride (K-DUR) 10 MEQ tablet TAKE 1 TABLET (10 MEQ TOTAL) BY MOUTH ONCE DAILY.  3   propranolol (INNOPRAN XL) 80 MG 24 hr capsule Take by mouth.     azithromycin (ZITHROMAX Z-PAK) 250 MG tablet Take 2 tablets on Day 1 and then 1 tablet daily till gone. (Patient not taking: Reported on 05/26/2021) 6 each 0   diazepam (VALIUM) 5 MG tablet Take 2 tablets (10 mg total) by  mouth every 12 (twelve) hours as needed for anxiety. (Patient not taking: Reported on 05/26/2021) 60 tablet 0   nitroGLYCERIN (NITROSTAT) 0.4 MG SL tablet Place 0.4 mg under the tongue every 5 (five) minutes as needed for chest pain.      ondansetron (ZOFRAN) 8 MG tablet Take 1 tablet (8 mg total) by mouth every 8 (eight) hours as needed for nausea or vomiting. 30 tablet 1   OXYGEN Inhale 2 L into the lungs at bedtime. (Patient not taking: Reported on 05/26/2021)     pantoprazole (PROTONIX) 40 MG tablet Take 40 mg by mouth 2 (two) times daily.     No current facility-administered medications for this visit.   Facility-Administered Medications Ordered in Other Visits  Medication Dose Route Frequency Provider Last Rate Last Admin   heparin lock flush 100 unit/mL  500 Units Intravenous Once [name], MD        OBJECTIVE: Vitals:   05/26/21 1317  BP: 125/82  Pulse: 99  Resp: 16  Temp: 98.4 F (36.9 C)     Body mass index is 26.68 kg/m.    ECOG FS:0 - Asymptomatic  General: Well-developed, well-nourished, no acute distress. Eyes: Pink conjunctiva, anicteric sclera. HEENT: Normocephalic, moist mucous membranes. Lungs: No audible wheezing or coughing. Heart: Regular rate and rhythm. Abdomen: Soft, nontender, no obvious distention. Musculoskeletal: No edema, cyanosis, or clubbing. Neuro: Alert, answering all questions appropriately. Cranial nerves grossly intact. Skin: No rashes or petechiae noted. Psych: Normal affect.   LAB RESULTS:  Lab Results  Component Value Date   NA 127 (L) 05/21/2021   K 3.9 05/21/2021   CL 92 (L) 05/21/2021   CO2 25 05/21/2021   GLUCOSE 98 05/21/2021   BUN 7 (L) 05/21/2021   CREATININE 0.69 05/21/2021   CALCIUM 8.8 (L) 05/21/2021   PROT 6.8 05/21/2021   ALBUMIN 4.0 05/21/2021   AST 24 05/21/2021   ALT 21 05/21/2021   ALKPHOS 113 05/21/2021   BILITOT 0.5 05/21/2021   GFRNONAA >60 05/21/2021   GFRAA >60 04/24/2020  Lab Results   Component Value Date   WBC 6.5 05/21/2021   NEUTROABS 4.7 05/21/2021   HGB 14.6 05/21/2021   HCT 40.8 05/21/2021   MCV 95.1 05/21/2021   PLT 304 05/21/2021     STUDIES:  ONCOLOGY HISTORY: Case discussed with pathologist and unable to determine whether this is adenocarcinoma or squamous cell carcinoma.  There is also insufficient tissue to do further testing.  Liquid biopsy did not reveal any actionable mutations.  MRI of the brain completed on Mar 28, 2018 reviewed independently did not reveal metastatic disease.  Patient completed XRT June 26, 2018.  He completed his concurrent single agent carboplatinum on June 21, 2018.  Patient had a reaction to Taxol during cycle 1 and this was discontinued.  He completed a year-long of maintenance durvalumab on June 27, 2019.   ASSESSMENT: Stage IIIa non-small cell lung cancer, right middle lobe.  PLAN:    1. Stage IIIa non-small cell lung cancer, right middle lobe: See oncology history above.  CT scan results from May 20, 2021 reviewed independently with concern for slight progression of disease.  Changes on CT scan may also be inflammatory given his recent episodes of bronchitis.  Will get a PET scan in the next 1 to 2 weeks to further evaluate.  Patient will have video assisted telemedicine visit after his PET scan to discuss the results and any additional diagnostic planning necessary.     2.  Secondary polycythemia: Resolved.  Likely secondary to heavy tobacco use.  Previously, the remainder of his laboratory work, including JAK-2 mutation, was either negative or within normal limits.  3.  Pulmonary nodule: Imaging and PET scan as above. 4.  Colon polyps:  Patient has a personal history of greater then 10 adenomatous polyps on his most recent conoloscopy. He does not know of any family history of increased polyps or colon cancer.  Genetic testing to assess for the APC mutation for FAP or AFAP was negative. Continue colonoscopies as per  GI. 5. Tobacco Use: Chronic and unchanged.  Patient continues to heavily smoke.  He expressed understanding by continuing tobacco use increases his chance of recurrence. 6. Anxiety: Chronic and unchanged.  Continue Valium 10 mg every 12 hours as needed.  Continue treatment and evaluation per primary care. 7.  Hyponatremia: Chronic and unchanged.  Patient's sodium level is 127. 8.  Hypothyroidism: Appreciate endocrinology input.  Continue Synthroid as prescribed.  Follow-up with endocrinology as scheduled. 9.  CHF: Patient has an EF of 40%.  Continue evaluation and treatment per cardiology. 10.  Leukocytosis: Resolved. 11.  Nausea: Likely related to patient's underlying anxiety.  Continue Zofran as needed.    Patient expressed understanding and was in agreement with this plan. He also understands that He can call clinic at any time with any questions, concerns, or complaints.    [name], MD   05/27/2021 9:07 AM

## 2021-05-26 ENCOUNTER — Encounter: Payer: Self-pay | Admitting: Oncology

## 2021-05-26 ENCOUNTER — Inpatient Hospital Stay: Payer: BC Managed Care – PPO | Admitting: Oncology

## 2021-05-26 VITALS — BP 125/82 | HR 99 | Temp 98.4°F | Resp 16 | Wt 196.7 lb

## 2021-05-26 DIAGNOSIS — C3491 Malignant neoplasm of unspecified part of right bronchus or lung: Secondary | ICD-10-CM | POA: Diagnosis not present

## 2021-05-26 DIAGNOSIS — C342 Malignant neoplasm of middle lobe, bronchus or lung: Secondary | ICD-10-CM | POA: Diagnosis not present

## 2021-05-26 MED ORDER — ONDANSETRON HCL 8 MG PO TABS: 8.0000 mg | ORAL_TABLET | Freq: Three times a day (TID) | ORAL | 1 refills | Status: DC | PRN

## 2021-05-26 NOTE — Progress Notes (Signed)
Here to discuss CT results.  Patient has new back/shoulder/neck pain.  Does have occasional nausea.

## 2021-05-27 ENCOUNTER — Encounter: Payer: Self-pay | Admitting: Oncology

## 2021-05-31 NOTE — Progress Notes (Signed)
[place]  [phone] [phone]  ID: Paul Carpenter OB: June 12, 1956  MR#: [id number]  RDE#:[id number]  Patient Care Team: Kirk Ruths, MD as PCP - General (Internal Medicine) [name], MD as Referring Physician (Vascular Surgery) [name], MD as Consulting Physician (Oncology) [name], MD as Referring Physician (Radiation Oncology)  I connected with Paul Carpenter on 06/05/21 at  3:45 PM EDT by video enabled telemedicine visit and verified that I am speaking with the correct person using two identifiers.   I discussed the limitations, risks, security and privacy concerns of performing an evaluation and management service by telemedicine and the availability of in-person appointments. I also discussed with the patient that there may be a patient responsible charge related to this service. The patient expressed understanding and agreed to proceed.   Other persons participating in the visit and their role in the encounter: Patient, MD.  Patient's location: Home. Provider's location: Clinic.  CHIEF COMPLAINT: Stage IIIa non-small cell lung cancer, right middle lobe.  INTERVAL HISTORY: Patient agreed to video assisted telemedicine visit for further evaluation and discussion of his PET scan results.  He continues to be anxious, but otherwise feels well.  He continues to have chronic cough.  He continues to smoke.  He has no neurologic complaints.  He denies any recent fevers or illnesses.  He denies any chest pain, shortness of breath, or hemoptysis. He denies any vomiting, constipation, or diarrhea. He has no urinary complaints.  Patient offers no further specific complaints today.    REVIEW OF SYSTEMS:   Review of Systems  Constitutional: Negative.  Negative for fever, malaise/fatigue and weight loss.  HENT: Negative.  Negative for congestion and sore throat.   Respiratory:  Positive for cough. Negative for  hemoptysis and shortness of breath.   Cardiovascular: Negative.  Negative for chest pain and leg swelling.  Gastrointestinal: Negative.  Negative for abdominal pain, blood in stool, melena and nausea.  Genitourinary: Negative.  Negative for dysuria.  Musculoskeletal: Negative.  Negative for joint pain.  Skin: Negative.  Negative for rash.  Neurological: Negative.  Negative for dizziness, sensory change, focal weakness, weakness and headaches.  Psychiatric/Behavioral:  The patient is nervous/anxious.    As per HPI. Otherwise, a complete review of systems is negative.  PAST MEDICAL HISTORY: Past Medical History:  Diagnosis Date   Anginal pain ([place])    Anxiety    Asthma    Chest pain    CHF (congestive heart failure) (HCC)    Chicken pox    Complication of anesthesia    o2 dropped after neck fusion   COPD (chronic obstructive pulmonary disease) (HCC)    Coronary artery disease    Cough    chronic  clear phlegm   Dysrhythmia    palpitations   GERD (gastroesophageal reflux disease)    h/o reflux/ hoarsness   Hematochezia    Hemorrhoids    History of chickenpox    History of colon polyps    History of Helicobacter pylori infection    Hoarseness    Hypertension    Lung cancer ([place]) 05/2016   Chemo + rad tx's.    Migraines    OSA (obstructive sleep apnea)    has CPAP but does not use   Personal history of tobacco use, presenting hazards to health 03/05/2016   Pneumonia    5/17   Raynaud disease    Raynaud disease    Raynaud's disease  Rotator cuff tear    on right   Shortness of breath dyspnea    Sleep apnea    Ulcer (traumatic) of oral mucosa     PAST SURGICAL HISTORY: Past Surgical History:  Procedure Laterality Date   BACK SURGERY     cervical fusion x 2   CARDIAC CATHETERIZATION     CERVICAL DISCECTOMY     x 2   COLONOSCOPY     COLONOSCOPY N/A 07/25/2015   Procedure: COLONOSCOPY;  Surgeon: Lollie Sails, MD;  Location: [facility] ENDOSCOPY;  Service:  Endoscopy;  Laterality: N/A;   COLONOSCOPY WITH PROPOFOL N/A 10/04/2017   Procedure: COLONOSCOPY WITH PROPOFOL;  Surgeon: Lollie Sails, MD;  Location: [facility] ENDOSCOPY;  Service: Endoscopy;  Laterality: N/A;   COLONOSCOPY WITH PROPOFOL N/A 07/10/2020   Procedure: COLONOSCOPY WITH PROPOFOL;  Surgeon: Toledo, Benay Pike, MD;  Location: [facility] ENDOSCOPY;  Service: Gastroenterology;  Laterality: N/A;   ELECTROMAGNETIC NAVIGATION BROCHOSCOPY Left 06/28/2016   Procedure: ELECTROMAGNETIC NAVIGATION BRONCHOSCOPY;  Surgeon: Vilinda Boehringer, MD;  Location: [facility] ORS;  Service: Cardiopulmonary;  Laterality: Left;   ENDOBRONCHIAL ULTRASOUND N/A 04/11/2018   Procedure: ENDOBRONCHIAL ULTRASOUND;  Surgeon: [name], MD;  Location: [facility] ORS;  Service: Cardiopulmonary;  Laterality: N/A;   ESOPHAGOGASTRODUODENOSCOPY N/A 07/25/2015   Procedure: ESOPHAGOGASTRODUODENOSCOPY (EGD);  Surgeon: Lollie Sails, MD;  Location[facility] ENDOSCOPY;  Service: Endoscopy;  Laterality: N/A;   ESOPHAGOGASTRODUODENOSCOPY (EGD) WITH PROPOFOL N/A 07/10/2020   Procedure: ESOPHAGOGASTRODUODENOSCOPY (EGD) WITH PROPOFOL;  Surgeon: Toledo, Benay Pike, MD;  Location: [facility] ENDOSCOPY;  Service: Gastroenterology;  Laterality: N/A;   NASAL SINUS SURGERY     x 2    PORTA CATH INSERTION N/A 04/24/2018   Procedure: PORTA CATH INSERTION;  Surgeon: Algernon Huxley, MD;  Location: [place] CV LAB;  Service: Cardiovascular;  Laterality: N/A;   rotator cuff surgery Right    07/2016   SEPTOPLASTY     SKIN GRAFT      FAMILY HISTORY Family History  Problem Relation Age of Onset   Heart disease Father    Prostate cancer Father    Heart disease Paternal Grandmother    Heart attack Maternal Grandfather 73   Kidney cancer Neg Hx    Bladder Cancer Neg Hx    Other Neg Hx        pituitary abnormality       ADVANCED DIRECTIVES:    HEALTH MAINTENANCE: Social History   Tobacco Use   Smoking status: Every Day    Packs/day: 3.00    Years: 45.00     Pack years: 135.00    Types: Cigarettes   Smokeless tobacco: Never   Tobacco comments:    2 PPD 04/21/2021  Vaping Use   Vaping Use: Never used  Substance Use Topics   Alcohol use: Yes    Alcohol/week: 2.0 standard drinks    Types: 2 Standard drinks or equivalent per week    Comment: moderate   Drug use: No     Allergies  Allergen Reactions   Lisinopril Rash   Varenicline Rash    Current Outpatient Medications  Medication Sig Dispense Refill   acetaminophen (TYLENOL) 500 MG tablet Take 500 mg by mouth daily.     albuterol (PROAIR HFA) 108 (90 Base) MCG/ACT inhaler Inhale 2 puffs into the lungs every 4 (four) hours as needed for wheezing or shortness of breath. 1 Inhaler 1   ANORO ELLIPTA 62.5-25 MCG/INH AEPB INHALE 1 PUFF BY MOUTH EVERY DAY 60 each 10  atorvastatin (LIPITOR) 40 MG tablet Take 40 mg by mouth at bedtime.      busPIRone (BUSPAR) 15 MG tablet Take 15 mg by mouth daily. Takes 0.5mg  daily  10   diazepam (VALIUM) 5 MG tablet Take 2 tablets (10 mg total) by mouth every 12 (twelve) hours as needed for anxiety. 60 tablet 0   DULoxetine (CYMBALTA) 60 MG capsule Take 1 capsule by mouth 1 day or 1 dose.     fluticasone (FLONASE) 50 MCG/ACT nasal spray Place 1 spray into both nostrils daily as needed (for allergies.).      Fluticasone-Umeclidin-Vilant (TRELEGY ELLIPTA) 200-62.5-25 MCG/INH AEPB Inhale 20 mcg into the lungs daily. 28 each 0   Fluticasone-Umeclidin-Vilant (TRELEGY ELLIPTA) 200-62.5-25 MCG/INH AEPB Inhale 200 mcg into the lungs daily. 28 each 0   levothyroxine (SYNTHROID) 150 MCG tablet Take 150 mcg by mouth daily before breakfast.     lidocaine (XYLOCAINE) 2 % solution   2   lidocaine-prilocaine (EMLA) cream Apply 1 application topically as needed. 30 g 1   metoprolol succinate (TOPROL-XL) 25 MG 24 hr tablet Take 25 mg by mouth daily.     Multiple Vitamin (MULTIVITAMIN WITH MINERALS) TABS tablet Take 1 tablet by mouth daily.      MYRBETRIQ 50 MG TB24 tablet  TAKE 1 TABLET BY MOUTH EVERY DAY 30 tablet 3   nitroGLYCERIN (NITROSTAT) 0.4 MG SL tablet Place 0.4 mg under the tongue every 5 (five) minutes as needed for chest pain.      ondansetron (ZOFRAN) 8 MG tablet Take 1 tablet (8 mg total) by mouth every 8 (eight) hours as needed for nausea or vomiting. 30 tablet 1   pantoprazole (PROTONIX) 40 MG tablet Take 40 mg by mouth 2 (two) times daily.     potassium chloride (K-DUR) 10 MEQ tablet TAKE 1 TABLET (10 MEQ TOTAL) BY MOUTH ONCE DAILY.  3   propranolol (INNOPRAN XL) 80 MG 24 hr capsule Take by mouth.     azithromycin (ZITHROMAX Z-PAK) 250 MG tablet Take 2 tablets on Day 1 and then 1 tablet daily till gone. (Patient not taking: No sig reported) 6 each 0   ipratropium-albuterol (DUONEB) 0.5-2.5 (3) MG/3ML SOLN Take 3 mLs by nebulization every 6 (six) hours. (Patient taking differently: Take 3 mLs by nebulization as needed.) 360 mL 5   LORazepam (ATIVAN) 0.5 MG tablet Take 0.5 mg by mouth 2 (two) times daily as needed. (Patient not taking: Reported on 06/04/2021)     OXYGEN Inhale 2 L into the lungs at bedtime. (Patient not taking: No sig reported)     No current facility-administered medications for this visit.   Facility-Administered Medications Ordered in Other Visits  Medication Dose Route Frequency Provider Last Rate Last Admin   heparin lock flush 100 unit/mL  500 Units Intravenous Once Grayland Ormond Kathlene November, MD        OBJECTIVE: There were no vitals filed for this visit.    There is no height or weight on file to calculate BMI.    ECOG FS:0 - Asymptomatic  General: Well-developed, well-nourished, no acute distress. HEENT: Normocephalic. Neuro: Alert, answering all questions appropriately. Cranial nerves grossly intact. Psych: Normal affect.   LAB RESULTS:  Lab Results  Component Value Date   NA 127 (L) 05/21/2021   K 3.9 05/21/2021   CL 92 (L) 05/21/2021   CO2 25 05/21/2021   GLUCOSE 98 05/21/2021   BUN 7 (L) 05/21/2021   CREATININE  0.69 05/21/2021   CALCIUM 8.8 (L)  05/21/2021   PROT 6.8 05/21/2021   ALBUMIN 4.0 05/21/2021   AST 24 05/21/2021   ALT 21 05/21/2021   ALKPHOS 113 05/21/2021   BILITOT 0.5 05/21/2021   GFRNONAA >60 05/21/2021   GFRAA >60 04/24/2020    Lab Results  Component Value Date   WBC 6.5 05/21/2021   NEUTROABS 4.7 05/21/2021   HGB 14.6 05/21/2021   HCT 40.8 05/21/2021   MCV 95.1 05/21/2021   PLT 304 05/21/2021     STUDIES:  ONCOLOGY HISTORY: Case discussed with pathologist and unable to determine whether this is adenocarcinoma or squamous cell carcinoma.  There is also insufficient tissue to do further testing.  Liquid biopsy did not reveal any actionable mutations.  MRI of the brain completed on Mar 28, 2018 reviewed independently did not reveal metastatic disease.  Patient completed XRT June 26, 2018.  He completed his concurrent single agent carboplatinum on June 21, 2018.  Patient had a reaction to Taxol during cycle 1 and this was discontinued.  He completed a year-long of maintenance durvalumab on June 27, 2019.   ASSESSMENT: Stage IIIa non-small cell lung cancer, right middle lobe.  PLAN:    1. Stage IIIa non-small cell lung cancer, right middle lobe: See oncology history above.  CT scan results from May 20, 2021 reviewed independently with concern for slight progression of disease.  Changes on CT scan may also be inflammatory given his recent episodes of bronchitis.  PET scan results from June 02, 2021 reviewed independently with only mild increase in SUV from 3.3-3.5.  There are no other areas of significant hypermetabolism.  Recommendation from radiology was to repeat CT scan in a short interval of 6 weeks to assess for interval change.  This has been ordered and patient will have a video assisted telemedicine visit 1 to 2 days after his imaging to discuss the results.   2.  Secondary polycythemia: Resolved.  Likely secondary to heavy tobacco use.  Previously, the remainder of  his laboratory work, including JAK-2 mutation, was either negative or within normal limits.  3.  Pulmonary nodule: Imaging and PET scan as above. 4.  Colon polyps:  Patient has a personal history of greater then 10 adenomatous polyps on his most recent conoloscopy. He does not know of any family history of increased polyps or colon cancer.  Genetic testing to assess for the APC mutation for FAP or AFAP was negative. Continue colonoscopies as per GI. 5. Tobacco Use: Chronic and unchanged.  Patient continues to heavily smoke.  He previously expressed understanding by continuing tobacco use increases his chance of recurrence. 6. Anxiety: Chronic and unchanged.  Continue Valium 10 mg every 12 hours as needed.  Continue treatment and evaluation per primary care. 7.  Hyponatremia: Chronic and unchanged.  Patient's most recent sodium level was reported at 127. 8.  Hypothyroidism: Appreciate endocrinology input.  Continue Synthroid as prescribed.  Follow-up with endocrinology as scheduled. 9.  CHF: Patient has an EF of 40%.  Continue evaluation and treatment per cardiology. 10.  Leukocytosis: Resolved. 11.  Nausea: Patient does not complain of this today.  Continue Zofran as needed.  I provided 20 minutes of face-to-face video visit time during this encounter which included chart review, counseling, and coordination of care as documented above.   Patient expressed understanding and was in agreement with this plan. He also understands that He can call clinic at any time with any questions, concerns, or complaints.    [name], MD  06/05/2021 6:40 AM

## 2021-06-02 ENCOUNTER — Other Ambulatory Visit: Payer: Self-pay

## 2021-06-02 ENCOUNTER — Ambulatory Visit
Admission: RE | Admit: 2021-06-02 | Discharge: 2021-06-02 | Disposition: A | Discharge status: Home / Self Care | Payer: BC Managed Care – PPO | Source: Ambulatory Visit | Attending: Oncology | Admitting: Oncology

## 2021-06-02 DIAGNOSIS — J439 Emphysema, unspecified: Secondary | ICD-10-CM | POA: Diagnosis not present

## 2021-06-02 DIAGNOSIS — S2242XA Multiple fractures of ribs, left side, initial encounter for closed fracture: Secondary | ICD-10-CM | POA: Insufficient documentation

## 2021-06-02 DIAGNOSIS — I700 Atherosclerosis of aorta: Secondary | ICD-10-CM | POA: Insufficient documentation

## 2021-06-02 DIAGNOSIS — C3491 Malignant neoplasm of unspecified part of right bronchus or lung: Secondary | ICD-10-CM | POA: Diagnosis present

## 2021-06-02 DIAGNOSIS — Y939 Activity, unspecified: Secondary | ICD-10-CM | POA: Diagnosis not present

## 2021-06-02 DIAGNOSIS — Y929 Unspecified place or not applicable: Secondary | ICD-10-CM | POA: Insufficient documentation

## 2021-06-02 DIAGNOSIS — N200 Calculus of kidney: Secondary | ICD-10-CM | POA: Diagnosis not present

## 2021-06-02 DIAGNOSIS — I2510 Atherosclerotic heart disease of native coronary artery without angina pectoris: Secondary | ICD-10-CM | POA: Insufficient documentation

## 2021-06-02 LAB — GLUCOSE, CAPILLARY: Glucose-Capillary: 104 mg/dL — ABNORMAL HIGH (ref 70–99)

## 2021-06-02 MED ORDER — FLUDEOXYGLUCOSE F - 18 (FDG) INJECTION
10.3000 | Freq: Once | INTRAVENOUS | Status: AC | PRN
  Administered 2021-06-02: 10.3 via INTRAVENOUS

## 2021-06-04 ENCOUNTER — Encounter: Payer: Self-pay | Admitting: Oncology

## 2021-06-04 ENCOUNTER — Inpatient Hospital Stay: Payer: BC Managed Care – PPO | Attending: Oncology | Admitting: Oncology

## 2021-06-04 DIAGNOSIS — C3491 Malignant neoplasm of unspecified part of right bronchus or lung: Secondary | ICD-10-CM

## 2021-06-05 ENCOUNTER — Encounter: Payer: Self-pay | Admitting: Oncology

## 2021-07-08 ENCOUNTER — Inpatient Hospital Stay: Payer: BC Managed Care – PPO | Attending: Oncology

## 2021-07-08 DIAGNOSIS — Z452 Encounter for adjustment and management of vascular access device: Secondary | ICD-10-CM | POA: Insufficient documentation

## 2021-07-08 DIAGNOSIS — Z95828 Presence of other vascular implants and grafts: Secondary | ICD-10-CM

## 2021-07-08 DIAGNOSIS — C342 Malignant neoplasm of middle lobe, bronchus or lung: Secondary | ICD-10-CM | POA: Insufficient documentation

## 2021-07-08 MED ORDER — HEPARIN SOD (PORK) LOCK FLUSH 100 UNIT/ML IV SOLN
INTRAVENOUS | Status: AC
  Filled 2021-07-08: qty 5

## 2021-07-08 MED ORDER — HEPARIN SOD (PORK) LOCK FLUSH 100 UNIT/ML IV SOLN
500.0000 [IU] | Freq: Once | INTRAVENOUS | Status: AC
  Administered 2021-07-08: 500 [IU] via INTRAVENOUS
  Filled 2021-07-08: qty 5

## 2021-07-08 MED ORDER — SODIUM CHLORIDE 0.9% FLUSH
10.0000 mL | Freq: Once | INTRAVENOUS | Status: AC
  Administered 2021-07-08: 10 mL via INTRAVENOUS
  Filled 2021-07-08: qty 10

## 2021-07-08 NOTE — Patient Instructions (Signed)
[facility]  Discharge Instructions: Thank you for choosing [facility] to provide your oncology and hematology care.  If you have a lab appointment with the [facility], please go directly to the [facility] and check in at the registration area.  Wear comfortable clothing and clothing appropriate for easy access to any Portacath or PICC line.   We strive to give you quality time with your provider. You may need to reschedule your appointment if you arrive late (15 or more minutes).  Arriving late affects you and other patients whose appointments are after yours.  Also, if you miss three or more appointments without notifying the office, you may be dismissed from the clinic at the provider's discretion.      For prescription refill requests, have your pharmacy contact our office and allow 72 hours for refills to be completed.    Today you received the following chemotherapy and/or immunotherapy agents PORT FLUSH      To help prevent nausea and vomiting after your treatment, we encourage you to take your nausea medication as directed.  BELOW ARE SYMPTOMS THAT SHOULD BE REPORTED IMMEDIATELY: *FEVER GREATER THAN 100.4 F (38 C) OR HIGHER *CHILLS OR SWEATING *NAUSEA AND VOMITING THAT IS NOT CONTROLLED WITH YOUR NAUSEA MEDICATION *UNUSUAL SHORTNESS OF BREATH *UNUSUAL BRUISING OR BLEEDING *URINARY PROBLEMS (pain or burning when urinating, or frequent urination) *BOWEL PROBLEMS (unusual diarrhea, constipation, pain near the anus) TENDERNESS IN MOUTH AND THROAT WITH OR WITHOUT PRESENCE OF ULCERS (sore throat, sores in mouth, or a toothache) UNUSUAL RASH, SWELLING OR PAIN  UNUSUAL VAGINAL DISCHARGE OR ITCHING   Items with * indicate a potential emergency and should be followed up as soon as possible or go to the Emergency Department if any problems should occur.  Please show the CHEMOTHERAPY ALERT CARD or IMMUNOTHERAPY ALERT CARD at check-in  to the Emergency Department and triage nurse.  Should you have questions after your visit or need to cancel or reschedule your appointment, please contact [facility]  [phone] and follow the prompts.  Office hours are 8:00 a.m. to 4:30 p.m. Monday - Friday. Please note that voicemails left after 4:00 p.m. may not be returned until the following business day.  We are closed weekends and major holidays. You have access to a nurse at all times for urgent questions. Please call the main number to the clinic [phone] and follow the prompts.  For any non-urgent questions, you may also contact your provider using MyChart. We now offer e-Visits for anyone 57 and older to request care online for non-urgent symptoms. For details visit [email or web address].   Also download the MyChart app! Go to the app store, search "MyChart", open the app, select , and log in with your MyChart username and password.  Due to Covid, a mask is required upon entering the hospital/clinic. If you do not have a mask, one will be given to you upon arrival. For doctor visits, patients may have 1 support person aged 51 or older with them. For treatment visits, patients cannot have anyone with them due to current Covid guidelines and our immunocompromised population.

## 2021-07-20 ENCOUNTER — Other Ambulatory Visit: Payer: Self-pay

## 2021-07-20 ENCOUNTER — Ambulatory Visit
Admission: RE | Admit: 2021-07-20 | Discharge: 2021-07-20 | Disposition: A | Discharge status: Home / Self Care | Payer: BC Managed Care – PPO | Source: Ambulatory Visit | Attending: Oncology | Admitting: Oncology

## 2021-07-20 DIAGNOSIS — C3491 Malignant neoplasm of unspecified part of right bronchus or lung: Secondary | ICD-10-CM | POA: Diagnosis not present

## 2021-07-20 LAB — POCT I-STAT CREATININE: Creatinine, Ser: 0.7 mg/dL (ref 0.61–1.24)

## 2021-07-20 MED ORDER — IOHEXOL 350 MG/ML SOLN
75.0000 mL | Freq: Once | INTRAVENOUS | Status: AC | PRN
  Administered 2021-07-20: 75 mL via INTRAVENOUS

## 2021-07-20 NOTE — Progress Notes (Deleted)
[place]  Telephone:([phone]) [phone] Fax:(336) [phone]  ID: Paul Carpenter OB: July 04, 1956  MR#: [id number]  YWV#:[id number]  Patient Care Team: Kirk Ruths, MD as PCP - General (Internal Medicine) [name], MD as Referring Physician (Vascular Surgery) [name], MD as Consulting Physician (Oncology) [name], MD as Referring Physician (Radiation Oncology)  I connected with Paul Carpenter on 07/20/21 at  3:30 PM EDT by {Blank single:19197::"video enabled telemedicine visit","telephone visit"} and verified that I am speaking with the correct person using two identifiers.   I discussed the limitations, risks, security and privacy concerns of performing an evaluation and management service by telemedicine and the availability of in-person appointments. I also discussed with the patient that there may be a patient responsible charge related to this service. The patient expressed understanding and agreed to proceed.   Other persons participating in the visit and their role in the encounter: Patient, MD.  Patient's location: Home. Provider's location: Clinic.  CHIEF COMPLAINT: Stage IIIa non-small cell lung cancer, right middle lobe.  INTERVAL HISTORY: Patient agreed to video assisted telemedicine visit for further evaluation and discussion of his PET scan results.  He continues to be anxious, but otherwise feels well.  He continues to have chronic cough.  He continues to smoke.  He has no neurologic complaints.  He denies any recent fevers or illnesses.  He denies any chest pain, shortness of breath, or hemoptysis. He denies any vomiting, constipation, or diarrhea. He has no urinary complaints.  Patient offers no further specific complaints today.    REVIEW OF SYSTEMS:   Review of Systems  Constitutional: Negative.  Negative for fever, malaise/fatigue and weight loss.  HENT: Negative.  Negative for congestion and sore throat.   Respiratory:   Positive for cough. Negative for hemoptysis and shortness of breath.   Cardiovascular: Negative.  Negative for chest pain and leg swelling.  Gastrointestinal: Negative.  Negative for abdominal pain, blood in stool, melena and nausea.  Genitourinary: Negative.  Negative for dysuria.  Musculoskeletal: Negative.  Negative for joint pain.  Skin: Negative.  Negative for rash.  Neurological: Negative.  Negative for dizziness, sensory change, focal weakness, weakness and headaches.  Psychiatric/Behavioral:  The patient is nervous/anxious.    As per HPI. Otherwise, a complete review of systems is negative.  PAST MEDICAL HISTORY: Past Medical History:  Diagnosis Date   Anginal pain ([place])    Anxiety    Asthma    Chest pain    CHF (congestive heart failure) (HCC)    Chicken pox    Complication of anesthesia    o2 dropped after neck fusion   COPD (chronic obstructive pulmonary disease) (HCC)    Coronary artery disease    Cough    chronic  clear phlegm   Dysrhythmia    palpitations   GERD (gastroesophageal reflux disease)    h/o reflux/ hoarsness   Hematochezia    Hemorrhoids    History of chickenpox    History of colon polyps    History of Helicobacter pylori infection    Hoarseness    Hypertension    Lung cancer ([place]) 05/2016   Chemo + rad tx's.    Migraines    OSA (obstructive sleep apnea)    has CPAP but does not use   Personal history of tobacco use, presenting hazards to health 03/05/2016   Pneumonia    5/17   Raynaud disease    Raynaud disease    Raynaud's disease  Rotator cuff tear    on right   Shortness of breath dyspnea    Sleep apnea    Ulcer (traumatic) of oral mucosa     PAST SURGICAL HISTORY: Past Surgical History:  Procedure Laterality Date   BACK SURGERY     cervical fusion x 2   CARDIAC CATHETERIZATION     CERVICAL DISCECTOMY     x 2   COLONOSCOPY     COLONOSCOPY N/A 07/25/2015   Procedure: COLONOSCOPY;  Surgeon: Lollie Sails, MD;  Location:  [facility] ENDOSCOPY;  Service: Endoscopy;  Laterality: N/A;   COLONOSCOPY WITH PROPOFOL N/A 10/04/2017   Procedure: COLONOSCOPY WITH PROPOFOL;  Surgeon: Lollie Sails, MD;  Location: [facility] ENDOSCOPY;  Service: Endoscopy;  Laterality: N/A;   COLONOSCOPY WITH PROPOFOL N/A 07/10/2020   Procedure: COLONOSCOPY WITH PROPOFOL;  Surgeon: Toledo, Benay Pike, MD;  Location: [facility] ENDOSCOPY;  Service: Gastroenterology;  Laterality: N/A;   ELECTROMAGNETIC NAVIGATION BROCHOSCOPY Left 06/28/2016   Procedure: ELECTROMAGNETIC NAVIGATION BRONCHOSCOPY;  Surgeon: Vilinda Boehringer, MD;  Location: [facility] ORS;  Service: Cardiopulmonary;  Laterality: Left;   ENDOBRONCHIAL ULTRASOUND N/A 04/11/2018   Procedure: ENDOBRONCHIAL ULTRASOUND;  Surgeon: [name], MD;  Location: [facility] ORS;  Service: Cardiopulmonary;  Laterality: N/A;   ESOPHAGOGASTRODUODENOSCOPY N/A 07/25/2015   Procedure: ESOPHAGOGASTRODUODENOSCOPY (EGD);  Surgeon: Lollie Sails, MD;  Location: [facility] ENDOSCOPY;  Service: Endoscopy;  Laterality: N/A;   ESOPHAGOGASTRODUODENOSCOPY (EGD) WITH PROPOFOL N/A 07/10/2020   Procedure: ESOPHAGOGASTRODUODENOSCOPY (EGD) WITH PROPOFOL;  Surgeon: Toledo, Benay Pike, MD;  Location: [facility] ENDOSCOPY;  Service: Gastroenterology;  Laterality: N/A;   NASAL SINUS SURGERY     x 2    PORTA CATH INSERTION N/A 04/24/2018   Procedure: PORTA CATH INSERTION;  Surgeon: Algernon Huxley, MD;  Location: [place] [facility] LAB;  Service: Cardiovascular;  Laterality: N/A;   rotator cuff surgery Right    07/2016   SEPTOPLASTY     SKIN GRAFT      FAMILY HISTORY Family History  Problem Relation Age of Onset   Heart disease Father    Prostate cancer Father    Heart disease Paternal Grandmother    Heart attack Maternal Grandfather 46   Kidney cancer Neg Hx    Bladder Cancer Neg Hx    Other Neg Hx        pituitary abnormality       ADVANCED DIRECTIVES:    HEALTH MAINTENANCE: Social History   Tobacco Use   Smoking status: Every Day     Packs/day: 3.00    Years: 45.00    Pack years: 135.00    Types: Cigarettes   Smokeless tobacco: Never   Tobacco comments:    2 PPD 04/21/2021  Vaping Use   Vaping Use: Never used  Substance Use Topics   Alcohol use: Yes    Alcohol/week: 2.0 standard drinks    Types: 2 Standard drinks or equivalent per week    Comment: moderate   Drug use: No     Allergies  Allergen Reactions   Lisinopril Rash   Varenicline Rash    Current Outpatient Medications  Medication Sig Dispense Refill   acetaminophen (TYLENOL) 500 MG tablet Take 500 mg by mouth daily.     albuterol (PROAIR HFA) 108 (90 Base) MCG/ACT inhaler Inhale 2 puffs into the lungs every 4 (four) hours as needed for wheezing or shortness of breath. 1 Inhaler 1   ANORO ELLIPTA 62.5-25 MCG/INH AEPB INHALE 1 PUFF BY MOUTH EVERY DAY 60 each 10  atorvastatin (LIPITOR) 40 MG tablet Take 40 mg by mouth at bedtime.      azithromycin (ZITHROMAX Z-PAK) 250 MG tablet Take 2 tablets on Day 1 and then 1 tablet daily till gone. (Patient not taking: No sig reported) 6 each 0   busPIRone (BUSPAR) 15 MG tablet Take 15 mg by mouth daily. Takes 0.5mg  daily  10   diazepam (VALIUM) 5 MG tablet Take 2 tablets (10 mg total) by mouth every 12 (twelve) hours as needed for anxiety. 60 tablet 0   DULoxetine (CYMBALTA) 60 MG capsule Take 1 capsule by mouth 1 day or 1 dose.     fluticasone (FLONASE) 50 MCG/ACT nasal spray Place 1 spray into both nostrils daily as needed (for allergies.).      Fluticasone-Umeclidin-Vilant (TRELEGY ELLIPTA) 200-62.5-25 MCG/INH AEPB Inhale 20 mcg into the lungs daily. 28 each 0   Fluticasone-Umeclidin-Vilant (TRELEGY ELLIPTA) 200-62.5-25 MCG/INH AEPB Inhale 200 mcg into the lungs daily. 28 each 0   ipratropium-albuterol (DUONEB) 0.5-2.5 (3) MG/3ML SOLN Take 3 mLs by nebulization every 6 (six) hours. (Patient taking differently: Take 3 mLs by nebulization as needed.) 360 mL 5   levothyroxine (SYNTHROID) 150 MCG tablet Take 150 mcg  by mouth daily before breakfast.     lidocaine (XYLOCAINE) 2 % solution   2   lidocaine-prilocaine (EMLA) cream Apply 1 application topically as needed. 30 g 1   LORazepam (ATIVAN) 0.5 MG tablet Take 0.5 mg by mouth 2 (two) times daily as needed. (Patient not taking: Reported on 06/04/2021)     metoprolol succinate (TOPROL-XL) 25 MG 24 hr tablet Take 25 mg by mouth daily.     Multiple Vitamin (MULTIVITAMIN WITH MINERALS) TABS tablet Take 1 tablet by mouth daily.      MYRBETRIQ 50 MG TB24 tablet TAKE 1 TABLET BY MOUTH EVERY DAY 30 tablet 3   nitroGLYCERIN (NITROSTAT) 0.4 MG SL tablet Place 0.4 mg under the tongue every 5 (five) minutes as needed for chest pain.      ondansetron (ZOFRAN) 8 MG tablet Take 1 tablet (8 mg total) by mouth every 8 (eight) hours as needed for nausea or vomiting. 30 tablet 1   OXYGEN Inhale 2 L into the lungs at bedtime. (Patient not taking: No sig reported)     pantoprazole (PROTONIX) 40 MG tablet Take 40 mg by mouth 2 (two) times daily.     potassium chloride (K-DUR) 10 MEQ tablet TAKE 1 TABLET (10 MEQ TOTAL) BY MOUTH ONCE DAILY.  3   propranolol (INNOPRAN XL) 80 MG 24 hr capsule Take by mouth.     No current facility-administered medications for this visit.   Facility-Administered Medications Ordered in Other Visits  Medication Dose Route Frequency Provider Last Rate Last Admin   heparin lock flush 100 unit/mL  500 Units Intravenous Once Grayland Ormond Kathlene November, MD        OBJECTIVE: There were no vitals filed for this visit.    There is no height or weight on file to calculate BMI.    ECOG FS:0 - Asymptomatic  General: Well-developed, well-nourished, no acute distress. HEENT: Normocephalic. Neuro: Alert, answering all questions appropriately. Cranial nerves grossly intact. Psych: Normal affect.   LAB RESULTS:  Lab Results  Component Value Date   NA 127 (L) 05/21/2021   K 3.9 05/21/2021   CL 92 (L) 05/21/2021   CO2 25 05/21/2021   GLUCOSE 98 05/21/2021    BUN 7 (L) 05/21/2021   CREATININE 0.70 07/20/2021   CALCIUM 8.8 (L)  05/21/2021   PROT 6.8 05/21/2021   ALBUMIN 4.0 05/21/2021   AST 24 05/21/2021   ALT 21 05/21/2021   ALKPHOS 113 05/21/2021   BILITOT 0.5 05/21/2021   GFRNONAA >60 05/21/2021   GFRAA >60 04/24/2020    Lab Results  Component Value Date   WBC 6.5 05/21/2021   NEUTROABS 4.7 05/21/2021   HGB 14.6 05/21/2021   HCT 40.8 05/21/2021   MCV 95.1 05/21/2021   PLT 304 05/21/2021     STUDIES:  ONCOLOGY HISTORY: Case discussed with pathologist and unable to determine whether this is adenocarcinoma or squamous cell carcinoma.  There is also insufficient tissue to do further testing.  Liquid biopsy did not reveal any actionable mutations.  MRI of the brain completed on Mar 28, 2018 reviewed independently did not reveal metastatic disease.  Patient completed XRT June 26, 2018.  He completed his concurrent single agent carboplatinum on June 21, 2018.  Patient had a reaction to Taxol during cycle 1 and this was discontinued.  He completed a year-long of maintenance durvalumab on June 27, 2019.   ASSESSMENT: Stage IIIa non-small cell lung cancer, right middle lobe.  PLAN:    1. Stage IIIa non-small cell lung cancer, right middle lobe: See oncology history above.  CT scan results from May 20, 2021 reviewed independently with concern for slight progression of disease.  Changes on CT scan may also be inflammatory given his recent episodes of bronchitis.  PET scan results from June 02, 2021 reviewed independently with only mild increase in SUV from 3.3-3.5.  There are no other areas of significant hypermetabolism.  Recommendation from radiology was to repeat CT scan in a short interval of 6 weeks to assess for interval change.  This has been ordered and patient will have a video assisted telemedicine visit 1 to 2 days after his imaging to discuss the results.   2.  Secondary polycythemia: Resolved.  Likely secondary to heavy tobacco  use.  Previously, the remainder of his laboratory work, including JAK-2 mutation, was either negative or within normal limits.  3.  Pulmonary nodule: Imaging and PET scan as above. 4.  Colon polyps:  Patient has a personal history of greater then 10 adenomatous polyps on his most recent conoloscopy. He does not know of any family history of increased polyps or colon cancer.  Genetic testing to assess for the APC mutation for FAP or AFAP was negative. Continue colonoscopies as per GI. 5. Tobacco Use: Chronic and unchanged.  Patient continues to heavily smoke.  He previously expressed understanding by continuing tobacco use increases his chance of recurrence. 6. Anxiety: Chronic and unchanged.  Continue Valium 10 mg every 12 hours as needed.  Continue treatment and evaluation per primary care. 7.  Hyponatremia: Chronic and unchanged.  Patient's most recent sodium level was reported at 127. 8.  Hypothyroidism: Appreciate endocrinology input.  Continue Synthroid as prescribed.  Follow-up with endocrinology as scheduled. 9.  CHF: Patient has an EF of 40%.  Continue evaluation and treatment per cardiology. 10.  Leukocytosis: Resolved. 11.  Nausea: Patient does not complain of this today.  Continue Zofran as needed.  I provided *** minutes of {Blank single:19197::"face-to-face video visit time","non face-to-face telephone visit time"} during this encounter which included chart review, counseling, and coordination of care as documented above.    Patient expressed understanding and was in agreement with this plan. He also understands that He can call clinic at any time with any questions, concerns, or complaints.  [name], MD   07/20/2021 12:05 PM

## 2021-07-23 ENCOUNTER — Inpatient Hospital Stay: Payer: BC Managed Care – PPO | Admitting: Oncology

## 2021-07-23 ENCOUNTER — Inpatient Hospital Stay (HOSPITAL_BASED_OUTPATIENT_CLINIC_OR_DEPARTMENT_OTHER): Payer: BC Managed Care – PPO | Admitting: Oncology

## 2021-07-23 DIAGNOSIS — C3491 Malignant neoplasm of unspecified part of right bronchus or lung: Secondary | ICD-10-CM

## 2021-07-23 NOTE — Progress Notes (Signed)
[place]  [phone] [phone]) [phone]  ID: Paul Carpenter OB: 11/22/1955  MR#: [id number]  UVO#:[id number]  Patient Care Team: Kirk Ruths, MD as PCP - General (Internal Medicine) [name], MD as Referring Physician (Vascular Surgery) [name], MD as Consulting Physician (Oncology) [name], MD as Referring Physician (Radiation Oncology)  I connected with Paul Carpenter on 07/23/21 at  9:30 AM EDT by video enabled telemedicine visit and verified that I am speaking with the correct person using two identifiers.   I discussed the limitations, risks, security and privacy concerns of performing an evaluation and management service by telemedicine and the availability of in-person appointments. I also discussed with the patient that there may be a patient responsible charge related to this service. The patient expressed understanding and agreed to proceed.   Other persons participating in the visit and their role in the encounter: Patient, MD.  Patient's location: Home. Provider's location: Clinic.  CHIEF COMPLAINT: Stage IIIa non-small cell lung cancer, right middle lobe.  INTERVAL HISTORY: Patient agreed to video assisted telemedicine visit for further evaluation and discussion of CT scan results.  He continues to be anxious, but otherwise feels well.  He continues to be anxious, but otherwise feels well.  He continues to have chronic cough.  He continues to smoke.  He has no neurologic complaints.  He denies any recent fevers or illnesses.  He denies any chest pain, shortness of breath, or hemoptysis. He denies any vomiting, constipation, or diarrhea. He has no urinary complaints.  Patient offers no further specific complaints today.  REVIEW OF SYSTEMS:   Review of Systems  Constitutional: Negative.  Negative for fever, malaise/fatigue and weight loss.  HENT: Negative.  Negative for congestion and sore throat.    Respiratory:  Positive for cough. Negative for hemoptysis and shortness of breath.   Cardiovascular: Negative.  Negative for chest pain and leg swelling.  Gastrointestinal: Negative.  Negative for abdominal pain, blood in stool, melena and nausea.  Genitourinary: Negative.  Negative for dysuria.  Musculoskeletal: Negative.  Negative for joint pain.  Skin: Negative.  Negative for rash.  Neurological: Negative.  Negative for dizziness, sensory change, focal weakness, weakness and headaches.  Psychiatric/Behavioral:  The patient is nervous/anxious.    As per HPI. Otherwise, a complete review of systems is negative.  PAST MEDICAL HISTORY: Past Medical History:  Diagnosis Date   Anginal pain ([place])    Anxiety    Asthma    Chest pain    CHF (congestive heart failure) (HCC)    Chicken pox    Complication of anesthesia    o2 dropped after neck fusion   COPD (chronic obstructive pulmonary disease) (HCC)    Coronary artery disease    Cough    chronic  clear phlegm   Dysrhythmia    palpitations   GERD (gastroesophageal reflux disease)    h/o reflux/ hoarsness   Hematochezia    Hemorrhoids    History of chickenpox    History of colon polyps    History of Helicobacter pylori infection    Hoarseness    Hypertension    Lung cancer ([place]) 05/2016   Chemo + rad tx's.    Migraines    OSA (obstructive sleep apnea)    has CPAP but does not use   Personal history of tobacco use, presenting hazards to health 03/05/2016   Pneumonia    5/17   Raynaud disease    Raynaud disease  Raynaud's disease    Rotator cuff tear    on right   Shortness of breath dyspnea    Sleep apnea    Ulcer (traumatic) of oral mucosa     PAST SURGICAL HISTORY: Past Surgical History:  Procedure Laterality Date   BACK SURGERY     cervical fusion x 2   CARDIAC CATHETERIZATION     CERVICAL DISCECTOMY     x 2   COLONOSCOPY     COLONOSCOPY N/A 07/25/2015   Procedure: COLONOSCOPY;  Surgeon: Lollie Sails,  MD;  Location: [facility] ENDOSCOPY;  Service: Endoscopy;  Laterality: N/A;   COLONOSCOPY WITH PROPOFOL N/A 10/04/2017   Procedure: COLONOSCOPY WITH PROPOFOL;  Surgeon: Lollie Sails, MD;  Location: [facility] ENDOSCOPY;  Service: Endoscopy;  Laterality: N/A;   COLONOSCOPY WITH PROPOFOL N/A 07/10/2020   Procedure: COLONOSCOPY WITH PROPOFOL;  Surgeon: Toledo, Benay Pike, MD;  Location: [facility] ENDOSCOPY;  Service: Gastroenterology;  Laterality: N/A;   ELECTROMAGNETIC NAVIGATION BROCHOSCOPY Left 06/28/2016   Procedure: ELECTROMAGNETIC NAVIGATION BRONCHOSCOPY;  Surgeon: Vilinda Boehringer, MD;  Location: [facility] ORS;  Service: Cardiopulmonary;  Laterality: Left;   ENDOBRONCHIAL ULTRASOUND N/A 04/11/2018   Procedure: ENDOBRONCHIAL ULTRASOUND;  Surgeon: [name], MD;  Location: [facility] ORS;  Service: Cardiopulmonary;  Laterality: N/A;   ESOPHAGOGASTRODUODENOSCOPY N/A 07/25/2015   Procedure: ESOPHAGOGASTRODUODENOSCOPY (EGD);  Surgeon: Lollie Sails, MD;  Location: [facility] ENDOSCOPY;  Service: Endoscopy;  Laterality: N/A;   ESOPHAGOGASTRODUODENOSCOPY (EGD) WITH PROPOFOL N/A 07/10/2020   Procedure: ESOPHAGOGASTRODUODENOSCOPY (EGD) WITH PROPOFOL;  Surgeon: Toledo, Benay Pike, MD;  Location: [facility] ENDOSCOPY;  Service: Gastroenterology;  Laterality: N/A;   NASAL SINUS SURGERY     x 2    PORTA CATH INSERTION N/A 04/24/2018   Procedure: PORTA CATH INSERTION;  Surgeon: Algernon Huxley, MD;  Location: [facility];  Service: Cardiovascular;  Laterality: N/A;   rotator cuff surgery Right    07/2016   SEPTOPLASTY     SKIN GRAFT      FAMILY HISTORY Family History  Problem Relation Age of Onset   Heart disease Father    Prostate cancer Father    Heart disease Paternal Grandmother    Heart attack Maternal Grandfather 54   Kidney cancer Neg Hx    Bladder Cancer Neg Hx    Other Neg Hx        pituitary abnormality       ADVANCED DIRECTIVES:    HEALTH MAINTENANCE: Social History   Tobacco Use   Smoking status: Every  Day    Packs/day: 3.00    Years: 45.00    Pack years: 135.00    Types: Cigarettes   Smokeless tobacco: Never   Tobacco comments:    2 PPD 04/21/2021  Vaping Use   Vaping Use: Never used  Substance Use Topics   Alcohol use: Yes    Alcohol/week: 2.0 standard drinks    Types: 2 Standard drinks or equivalent per week    Comment: moderate   Drug use: No     Allergies  Allergen Reactions   Lisinopril Rash   Varenicline Rash    Current Outpatient Medications  Medication Sig Dispense Refill   acetaminophen (TYLENOL) 500 MG tablet Take 500 mg by mouth daily.     albuterol (PROAIR HFA) 108 (90 Base) MCG/ACT inhaler Inhale 2 puffs into the lungs every 4 (four) hours as needed for wheezing or shortness of breath. 1 Inhaler 1   ANORO ELLIPTA 62.5-25 MCG/INH AEPB INHALE 1 PUFF BY MOUTH EVERY DAY  60 each 10   atorvastatin (LIPITOR) 40 MG tablet Take 40 mg by mouth at bedtime.      busPIRone (BUSPAR) 15 MG tablet Take 15 mg by mouth daily. Takes 0.5mg  daily  10   diazepam (VALIUM) 5 MG tablet Take 2 tablets (10 mg total) by mouth every 12 (twelve) hours as needed for anxiety. 60 tablet 0   DULoxetine (CYMBALTA) 60 MG capsule Take 1 capsule by mouth 1 day or 1 dose.     fluticasone (FLONASE) 50 MCG/ACT nasal spray Place 1 spray into both nostrils daily as needed (for allergies.).      Fluticasone-Umeclidin-Vilant (TRELEGY ELLIPTA) 200-62.5-25 MCG/INH AEPB Inhale 20 mcg into the lungs daily. 28 each 0   Fluticasone-Umeclidin-Vilant (TRELEGY ELLIPTA) 200-62.5-25 MCG/INH AEPB Inhale 200 mcg into the lungs daily. 28 each 0   ipratropium-albuterol (DUONEB) 0.5-2.5 (3) MG/3ML SOLN Take 3 mLs by nebulization every 6 (six) hours. (Patient taking differently: Take 3 mLs by nebulization as needed.) 360 mL 5   levothyroxine (SYNTHROID) 150 MCG tablet Take 150 mcg by mouth daily before breakfast.     lidocaine (XYLOCAINE) 2 % solution   2   lidocaine-prilocaine (EMLA) cream Apply 1 application topically as  needed. 30 g 1   metoprolol succinate (TOPROL-XL) 25 MG 24 hr tablet Take 25 mg by mouth daily.     Multiple Vitamin (MULTIVITAMIN WITH MINERALS) TABS tablet Take 1 tablet by mouth daily.      MYRBETRIQ 50 MG TB24 tablet TAKE 1 TABLET BY MOUTH EVERY DAY 30 tablet 3   ondansetron (ZOFRAN) 8 MG tablet Take 1 tablet (8 mg total) by mouth every 8 (eight) hours as needed for nausea or vomiting. 30 tablet 1   pantoprazole (PROTONIX) 40 MG tablet Take 40 mg by mouth 2 (two) times daily.     potassium chloride (K-DUR) 10 MEQ tablet TAKE 1 TABLET (10 MEQ TOTAL) BY MOUTH ONCE DAILY.  3   propranolol (INNOPRAN XL) 80 MG 24 hr capsule Take by mouth.     azithromycin (ZITHROMAX Z-PAK) 250 MG tablet Take 2 tablets on Day 1 and then 1 tablet daily till gone. (Patient not taking: No sig reported) 6 each 0   LORazepam (ATIVAN) 0.5 MG tablet Take 0.5 mg by mouth 2 (two) times daily as needed. (Patient not taking: No sig reported)     nitroGLYCERIN (NITROSTAT) 0.4 MG SL tablet Place 0.4 mg under the tongue every 5 (five) minutes as needed for chest pain.      OXYGEN Inhale 2 L into the lungs at bedtime. (Patient not taking: No sig reported)     No current facility-administered medications for this visit.   Facility-Administered Medications Ordered in Other Visits  Medication Dose Route Frequency Provider Last Rate Last Admin   heparin lock flush 100 unit/mL  500 Units Intravenous Once Grayland Ormond Kathlene November, MD        OBJECTIVE: There were no vitals filed for this visit.    There is no height or weight on file to calculate BMI.    ECOG FS:0 - Asymptomatic  General: Well-developed, well-nourished, no acute distress. HEENT: Normocephalic. Neuro: Alert, answering all questions appropriately. Cranial nerves grossly intact. Psych: Normal affect.   LAB RESULTS:  Lab Results  Component Value Date   NA 127 (L) 05/21/2021   K 3.9 05/21/2021   CL 92 (L) 05/21/2021   CO2 25 05/21/2021   GLUCOSE 98 05/21/2021    BUN 7 (L) 05/21/2021   CREATININE 0.70 07/20/2021  CALCIUM 8.8 (L) 05/21/2021   PROT 6.8 05/21/2021   ALBUMIN 4.0 05/21/2021   AST 24 05/21/2021   ALT 21 05/21/2021   ALKPHOS 113 05/21/2021   BILITOT 0.5 05/21/2021   GFRNONAA >60 05/21/2021   GFRAA >60 04/24/2020    Lab Results  Component Value Date   WBC 6.5 05/21/2021   NEUTROABS 4.7 05/21/2021   HGB 14.6 05/21/2021   HCT 40.8 05/21/2021   MCV 95.1 05/21/2021   PLT 304 05/21/2021     STUDIES:  ONCOLOGY HISTORY: Case discussed with pathologist and unable to determine whether this is adenocarcinoma or squamous cell carcinoma.  There is also insufficient tissue to do further testing.  Liquid biopsy did not reveal any actionable mutations.  MRI of the brain completed on Mar 28, 2018 reviewed independently did not reveal metastatic disease.  Patient completed XRT June 26, 2018.  He completed his concurrent single agent carboplatinum on June 21, 2018.  Patient had a reaction to Taxol during cycle 1 and this was discontinued.  He completed a year-long of maintenance durvalumab on June 27, 2019.   ASSESSMENT: Stage IIIa non-small cell lung cancer, right middle lobe.  PLAN:    1. Stage IIIa non-small cell lung cancer, right middle lobe: See oncology history above.  CT scan results from May 20, 2021 reviewed independently with concern for slight progression of disease.  Changes on CT scan may also be inflammatory given his recent episodes of bronchitis.  PET scan results from June 02, 2021 reviewed independently with only mild increase in SUV from 3.3-3.5.  There are no other areas of significant hypermetabolism.  Recommendation from radiology was to repeat CT scan in a short interval which was completed on July 20, 2021.  The scan was reviewed independently and appears to show mild progression of disease locally in the right middle lobe lesion.  No other evidence of malignancy is noted.  Mild progression does not appear to  warrant systemic chemotherapy, but will refer patient back to radiation oncology for consideration of XRT.  Follow-up will be arranged after radiation oncology.   2.  Secondary polycythemia: Resolved.  Likely secondary to heavy tobacco use.  Previously, the remainder of his laboratory work, including JAK-2 mutation, was either negative or within normal limits.  3.  Pulmonary nodule: Imaging and PET scan as above. 4.  Colon polyps:  Patient has a personal history of greater then 10 adenomatous polyps on his most recent conoloscopy. He does not know of any family history of increased polyps or colon cancer.  Genetic testing to assess for the APC mutation for FAP or AFAP was negative. Continue colonoscopies as per GI. 5. Tobacco Use: Chronic and unchanged.  Patient continues to heavily smoke.  He previously expressed understanding by continuing tobacco use increases his chance of recurrence. 6. Anxiety: Chronic and unchanged.  Continue Valium 10 mg every 12 hours as needed.  Continue treatment and evaluation per primary care. 7.  Hyponatremia: Chronic and unchanged.  Patient's most recent sodium level was reported at 127. 8.  Hypothyroidism: Appreciate endocrinology input.  Continue Synthroid as prescribed.  Follow-up with endocrinology as scheduled. 9.  CHF: Patient has an EF of 40%.  Continue evaluation and treatment per cardiology. 10.  Leukocytosis: Resolved. 11.  Nausea: Patient does not complain of this today.  Continue Zofran as needed.  I provided 20 minutes of face-to-face video visit time during this encounter which included chart review, counseling, and coordination of care as documented above.  Patient expressed understanding and was in agreement with this plan. He also understands that He can call clinic at any time with any questions, concerns, or complaints.    [name], MD   07/23/2021 6:50 PM

## 2021-07-23 NOTE — Progress Notes (Signed)
Pt has concerns about CT results. C/o numbness in feet.

## 2021-07-30 ENCOUNTER — Encounter: Payer: Self-pay | Admitting: Radiation Oncology

## 2021-07-30 ENCOUNTER — Ambulatory Visit
Admission: RE | Admit: 2021-07-30 | Discharge: 2021-07-30 | Disposition: A | Discharge status: Home / Self Care | Payer: BC Managed Care – PPO | Source: Ambulatory Visit | Attending: Radiation Oncology | Admitting: Radiation Oncology

## 2021-07-30 VITALS — BP 141/81 | HR 98 | Temp 97.6°F | Resp 18 | Wt 196.3 lb

## 2021-07-30 DIAGNOSIS — Z9221 Personal history of antineoplastic chemotherapy: Secondary | ICD-10-CM | POA: Diagnosis not present

## 2021-07-30 DIAGNOSIS — R918 Other nonspecific abnormal finding of lung field: Secondary | ICD-10-CM | POA: Diagnosis not present

## 2021-07-30 DIAGNOSIS — C342 Malignant neoplasm of middle lobe, bronchus or lung: Secondary | ICD-10-CM | POA: Diagnosis present

## 2021-07-30 DIAGNOSIS — F17210 Nicotine dependence, cigarettes, uncomplicated: Secondary | ICD-10-CM | POA: Diagnosis not present

## 2021-07-30 DIAGNOSIS — Z923 Personal history of irradiation: Secondary | ICD-10-CM | POA: Insufficient documentation

## 2021-07-30 NOTE — Progress Notes (Signed)
Radiation Oncology Follow up Note reevaluation of right upper lobe recurrence  Name: Paul Carpenter   Date:   07/30/2021 MRN:  [id number] DOB: 02-Feb-1956    This 65 y.o. male presents to the clinic today for reevaluation of probable right upper lobe recurrence in patient status post concurrent chemoradiation therapy for stage IIIa non-small cell lung cancer of the right upper lobe 3 years prior.  PET/CT demonstrated again spiculated cavitary mass in the right apex progressive over time since 21 with an SUV of 3.5.  Repeat CT scan this month again showed continued slow evolution of thick-walled character mass concerning for slow-growing neoplasm.  No mediastinal hilar adenopathy is noted either on CT or PET.  He does have a chronic cough no hemoptysis or chest tightness.  He was having some neurologic problems MRI scan was negative for metastatic disease.  He is seen today for consideration of retreatment to the right upper lobe.  REFERRING PROVIDER: Kirk Ruths, MD  HPI: Patient is a 65 year old male now out over 3 years having completed concurrent chemoradiation therapy for stage IIIa non-small cell lung cancer right middle lobe.  We have been tracking a lesion in his right upper lobe.  Showing a cavitary lesion in the apex worrisome for interval changes and suspicious for recurrent tumor.  COMPLICATIONS OF TREATMENT: none  FOLLOW UP COMPLIANCE: keeps appointments   PHYSICAL EXAM:  BP (!) 141/81 (BP Location: Left Arm, Patient Position: Sitting, Cuff Size: Normal)   Pulse 98   Temp 97.6 F (36.4 C) (Tympanic)   Resp 18   Wt 196 lb 4.8 oz (89 kg)   SpO2 100%   BMI 26.62 kg/m  Well-developed well-nourished patient in NAD. HEENT reveals PERLA, EOMI, discs not visualized.  Oral cavity is clear. No oral mucosal lesions are identified. Neck is clear without evidence of cervical or supraclavicular adenopathy. Lungs are clear to A&P. Cardiac examination is essentially unremarkable with  regular rate and rhythm without murmur rub or thrill. Abdomen is benign with no organomegaly or masses noted. Motor sensory and DTR levels are equal and symmetric in the upper and lower extremities. Cranial nerves II through XII are grossly intact. Proprioception is intact. No peripheral adenopathy or edema is identified. No motor or sensory levels are noted. Crude visual fields are within normal range.  RADIOLOGY RESULTS: Serial CT scans and PET scans reviewed compatible with above-stated findings  PLAN: This time elect to go ahead with SBRT to his right upper lobe.  Would plan on delivering 50 Gray in 5 fractions.  Risks and benefits of treatment occluding reirradiation of previously radiated area development of cough slight fatigue and further architectural distortion of his lung were all discussed in detail with the patient and his family.  They all seem to comprehend my treatment plan well.  We will use 4-dimensional treatment planning PET fusion and motion restriction for CT simulation.  I have personally set up CT simulation.  I would like to take this opportunity to thank you for allowing me to participate in the care of your patient.[name], MD

## 2021-08-06 ENCOUNTER — Ambulatory Visit
Admission: RE | Admit: 2021-08-06 | Discharge: 2021-08-06 | Disposition: A | Discharge status: Home / Self Care | Payer: BC Managed Care – PPO | Source: Ambulatory Visit | Attending: Radiation Oncology | Admitting: Radiation Oncology

## 2021-08-06 DIAGNOSIS — Z452 Encounter for adjustment and management of vascular access device: Secondary | ICD-10-CM | POA: Insufficient documentation

## 2021-08-06 DIAGNOSIS — C3411 Malignant neoplasm of upper lobe, right bronchus or lung: Secondary | ICD-10-CM | POA: Insufficient documentation

## 2021-08-06 DIAGNOSIS — Z510 Encounter for antineoplastic radiation therapy: Secondary | ICD-10-CM | POA: Insufficient documentation

## 2021-08-06 DIAGNOSIS — C342 Malignant neoplasm of middle lobe, bronchus or lung: Secondary | ICD-10-CM | POA: Insufficient documentation

## 2021-08-11 DIAGNOSIS — Z510 Encounter for antineoplastic radiation therapy: Secondary | ICD-10-CM | POA: Diagnosis not present

## 2021-08-17 NOTE — Progress Notes (Signed)
08/18/2021  9:49 AM   [name] Aug 25, 1956 [id number]  Referring provider: Kirk Ruths, MD [facility] [place],  [place] [address]  Chief Complaint  Patient presents with   Benign Prostatic Hypertrophy    Urological history: 1. ED -contributing factors of age, BPH, HTN, CAD, hypothyroidism, smoking, COPD,  BP pills,  depression and anxiety -SHIM 18  2. BPH with LU TS -cysto 08/2020 - moderate bilateral lobe enlargement  -I PSS 29/5 -PVR 26 mL  3. Nephrolithiasis -5 mm left renal stone on 06/02/2021  HPI: Paul Carpenter is a 65 y.o.  male with Stage III lung cancer with recurrent tumor who presents today for yearly follow up.   Patient is not having spontaneous erections.  He denies any pain or curvature with erections.    He states his erections are firm enough for intercourse, but he states his libido has decreased significantly.    He also has the complaint of penile curvature for which he states his wife is bothered during intercourse.     SHIM     Row Name 08/18/21 0858         SHIM: Over the last 6 months:   How do you rate your confidence that you could get and keep an erection? Moderate     When you had erections with sexual stimulation, how often were your erections hard enough for penetration (entering your partner)? Sometimes (about half the time)     During sexual intercourse, how often were you able to maintain your erection after you had penetrated (entered) your partner? Most Times (much more than half the time)     During sexual intercourse, how difficult was it to maintain your erection to completion of intercourse? Slightly Difficult     When you attempted sexual intercourse, how often was it satisfactory for you? Most Times (much more than half the time)           SHIM Total Score   SHIM 18               Score: 1-7 Severe ED 8-11 Moderate ED 12-16 Mild-Moderate ED 17-21 Mild ED 22-25  No ED  He has nocturia x 4-5.  He has urinary intermittency during the day as well.  Patient denies any modifying or aggravating factors.  Patient denies any gross hematuria, dysuria or suprapubic/flank pain.  Patient denies any fevers, chills, nausea or vomiting.    He admits to drinking Pepsi, tea, beer and mixed drinks during the day.  He drinks very limited water.  He is not wearing his oxygen at night.      IPSS     Row Name 08/18/21 0800         International Prostate Symptom Score   How often have you had the sensation of not emptying your bladder? Almost always     How often have you had to urinate less than every two hours? Almost always     How often have you found you stopped and started again several times when you urinated? Almost always     How often have you found it difficult to postpone urination? More than half the time     How often have you had a weak urinary stream? More than half the time     How often have you had to strain to start urination? Less than half the time     How many times did you typically  get up at night to urinate? 4 Times     Total IPSS Score 29           Quality of Life due to urinary symptoms   If you were to spend the rest of your life with your urinary condition just the way it is now how would you feel about that? Unhappy               Score:  1-7 Mild 8-19 Moderate 20-35 Severe  PMH: Past Medical History:  Diagnosis Date   Anginal pain (HCC)    Anxiety    Asthma    Chest pain    CHF (congestive heart failure) (HCC)    Chicken pox    Complication of anesthesia    o2 dropped after neck fusion   COPD (chronic obstructive pulmonary disease) (HCC)    Coronary artery disease    Cough    chronic  clear phlegm   Dysrhythmia    palpitations   GERD (gastroesophageal reflux disease)    h/o reflux/ hoarsness   Hematochezia    Hemorrhoids    History of chickenpox    History of colon polyps    History of Helicobacter pylori  infection    Hoarseness    Hypertension    Lung cancer ([place]) 05/2016   Chemo + rad tx's.    Migraines    OSA (obstructive sleep apnea)    has CPAP but does not use   Personal history of tobacco use, presenting hazards to health 03/05/2016   Pneumonia    5/17   Raynaud disease    Raynaud disease    Raynaud's disease    Rotator cuff tear    on right   Shortness of breath dyspnea    Sleep apnea    Ulcer (traumatic) of oral mucosa     Surgical History: Past Surgical History:  Procedure Laterality Date   BACK SURGERY     cervical fusion x 2   CARDIAC CATHETERIZATION     CERVICAL DISCECTOMY     x 2   COLONOSCOPY     COLONOSCOPY N/A 07/25/2015   Procedure: COLONOSCOPY;  Surgeon: Lollie Sails, MD;  Location: [facility] ENDOSCOPY;  Service: Endoscopy;  Laterality: N/A;   COLONOSCOPY WITH PROPOFOL N/A 10/04/2017   Procedure: COLONOSCOPY WITH PROPOFOL;  Surgeon: Lollie Sails, MD;  Location: [facility] ENDOSCOPY;  Service: Endoscopy;  Laterality: N/A;   COLONOSCOPY WITH PROPOFOL N/A 07/10/2020   Procedure: COLONOSCOPY WITH PROPOFOL;  Surgeon: Toledo, Benay Pike, MD;  Location: [facility] ENDOSCOPY;  Service: Gastroenterology;  Laterality: N/A;   ELECTROMAGNETIC NAVIGATION BROCHOSCOPY Left 06/28/2016   Procedure: ELECTROMAGNETIC NAVIGATION BRONCHOSCOPY;  Surgeon: Vilinda Boehringer, MD;  Location: [facility] ORS;  Service: Cardiopulmonary;  Laterality: Left;   ENDOBRONCHIAL ULTRASOUND N/A 04/11/2018   Procedure: ENDOBRONCHIAL ULTRASOUND;  Surgeon: [name], MD;  Location: [facility] ORS;  Service: Cardiopulmonary;  Laterality: N/A;   ESOPHAGOGASTRODUODENOSCOPY N/A 07/25/2015   Procedure: ESOPHAGOGASTRODUODENOSCOPY (EGD);  Surgeon: Lollie Sails, MD;  Location: [facility] ENDOSCOPY;  Service: Endoscopy;  Laterality: N/A;   ESOPHAGOGASTRODUODENOSCOPY (EGD) WITH PROPOFOL N/A 07/10/2020   Procedure: ESOPHAGOGASTRODUODENOSCOPY (EGD) WITH PROPOFOL;  Surgeon: Toledo, Benay Pike, MD;  Location: [facility] ENDOSCOPY;  Service:  Gastroenterology;  Laterality: N/A;   NASAL SINUS SURGERY     x 2    PORTA CATH INSERTION N/A 04/24/2018   Procedure: PORTA CATH INSERTION;  Surgeon: Algernon Huxley, MD;  Location: [facility] CV LAB;  Service: Cardiovascular;  Laterality: N/A;  rotator cuff surgery Right    07/2016   SEPTOPLASTY     SKIN GRAFT      Home Medications:  Allergies as of 08/18/2021       Reactions   Lisinopril Rash   Varenicline Rash        Medication List        Accurate as of August 18, 2021  9:49 AM. If you have any questions, ask your nurse or doctor.          STOP taking these medications    azithromycin 250 MG tablet Commonly known as: Zithromax Z-Pak Stopped by: Loys Shugars, PA-C   busPIRone 15 MG tablet Commonly known as: BUSPAR Stopped by: Jamani Eley, PA-C   LORazepam 0.5 MG tablet Commonly known as: ATIVAN Stopped by: Michiel Cowboy, PA-C       TAKE these medications    acetaminophen 500 MG tablet Commonly known as: TYLENOL Take 500 mg by mouth daily.   albuterol 108 (90 Base) MCG/ACT inhaler Commonly known as: ProAir HFA Inhale 2 puffs into the lungs every 4 (four) hours as needed for wheezing or shortness of breath.   ALPRAZolam 0.25 MG tablet Commonly known as: XANAX Take 0.25 mg by mouth 2 (two) times daily as needed.   Anoro Ellipta 62.5-25 MCG/INH Aepb Generic drug: umeclidinium-vilanterol INHALE 1 PUFF BY MOUTH EVERY DAY   atorvastatin 40 MG tablet Commonly known as: LIPITOR Take 40 mg by mouth at bedtime.   diazepam 5 MG tablet Commonly known as: VALIUM Take 2 tablets (10 mg total) by mouth every 12 (twelve) hours as needed for anxiety.   DULoxetine 60 MG capsule Commonly known as: CYMBALTA Take 1 capsule by mouth 1 day or 1 dose.   fluticasone 50 MCG/ACT nasal spray Commonly known as: FLONASE Place 1 spray into both nostrils daily as needed (for allergies.).   ipratropium-albuterol 0.5-2.5 (3) MG/3ML Soln Commonly known as:  DUONEB Take 3 mLs by nebulization every 6 (six) hours. What changed:  when to take this reasons to take this   levothyroxine 150 MCG tablet Commonly known as: SYNTHROID Take 150 mcg by mouth daily before breakfast.   lidocaine 2 % solution Commonly known as: XYLOCAINE   lidocaine-prilocaine cream Commonly known as: EMLA Apply 1 application topically as needed.   metoprolol succinate 25 MG 24 hr tablet Commonly known as: TOPROL-XL Take 25 mg by mouth daily.   multivitamin with minerals Tabs tablet Take 1 tablet by mouth daily.   Myrbetriq 50 MG Tb24 tablet Generic drug: mirabegron ER TAKE 1 TABLET BY MOUTH EVERY DAY   nitroGLYCERIN 0.4 MG SL tablet Commonly known as: NITROSTAT Place 0.4 mg under the tongue every 5 (five) minutes as needed for chest pain.   ondansetron 8 MG tablet Commonly known as: ZOFRAN Take 1 tablet (8 mg total) by mouth every 8 (eight) hours as needed for nausea or vomiting.   OXYGEN Inhale 2 L into the lungs at bedtime.   pantoprazole 40 MG tablet Commonly known as: PROTONIX Take 40 mg by mouth 2 (two) times daily.   potassium chloride 10 MEQ tablet Commonly known as: KLOR-CON TAKE 1 TABLET (10 MEQ TOTAL) BY MOUTH ONCE DAILY.   propranolol 80 MG 24 hr capsule Commonly known as: INNOPRAN XL Take by mouth.   Trelegy Ellipta 200-62.5-25 MCG/INH Aepb Generic drug: Fluticasone-Umeclidin-Vilant Inhale 20 mcg into the lungs daily.   Trelegy Ellipta 200-62.5-25 MCG/INH Aepb Generic drug: Fluticasone-Umeclidin-Vilant Inhale 200 mcg into the lungs daily.  Allergies:  Allergies  Allergen Reactions   Lisinopril Rash   Varenicline Rash    Family History: Family History  Problem Relation Age of Onset   Heart disease Father    Prostate cancer Father    Heart disease Paternal Grandmother    Heart attack Maternal Grandfather 50   Kidney cancer Neg Hx    Bladder Cancer Neg Hx    Other Neg Hx        pituitary abnormality     Social History:  reports that he has been smoking cigarettes. He has a 135.00 pack-year smoking history. He has never used smokeless tobacco. He reports current alcohol use of about 2.0 standard drinks per week. He reports that he does not use drugs.  ROS: For pertinent review of systems please refer to history of present illness  Physical Exam: BP 136/87   Pulse 99   Ht 6' (1.829 m)   Wt 194 lb (88 kg)   BMI 26.31 kg/m   Constitutional:  Well nourished. Alert and oriented, No acute distress. HEENT: [place] AT, mask in place.  Trachea midline Cardiovascular: No clubbing, cyanosis, or edema. Respiratory: Normal respiratory effort, no increased work of breathing. Neurologic: Grossly intact, no focal deficits, moving all 4 extremities. Psychiatric: Normal mood and affect.   Laboratory Data: Color Yellow, Violet, Light Violet, Dark Violet Yellow   Clarity Clear, Other Clear   Specific Gravity 1.000 - 1.030 1.025   pH, Urine 5.0 - 8.0 6.0   Protein, Urinalysis Negative, Trace mg/dL Trace   Glucose, Urinalysis Negative mg/dL Negative   Ketones, Urinalysis Negative mg/dL Trace Abnormal    Blood, Urinalysis Negative Negative   Nitrite, Urinalysis Negative Negative   Leukocyte Esterase, Urinalysis Negative Negative   White Blood Cells, Urinalysis None Seen, 0-3 /hpf 0-3   Red Blood Cells, Urinalysis None Seen, 0-3 /hpf None Seen   Bacteria, Urinalysis None Seen /hpf Rare Abnormal    Squamous Epithelial Cells, Urinalysis Rare, Few, None Seen /hpf None Seen   Crystals, Urinalysis None Seen Few Abnormal    Comment: Calcium Oxalate  Resulting Agency  [facility] - LAB  Narrative Performed by [facility] - LAB Trace mucus Specimen Collected: 07/07/21 11:34 Last Resulted: 07/07/21 14:17  Received From: [facility]  Result Received: 07/08/21 13:55   Thyroid Stimulating Hormone (TSH) 0.450-5.330 uIU/ml uIU/mL 0.173 Low    Resulting Agency  [facility] - LAB  Specimen Collected: 07/01/21 09:51 Last Resulted: 07/01/21 11:51  Received From: [facility]  Result Received: 07/08/21 13:55   Glucose 70 - 110 mg/dL 94   Sodium 136 - 145 mmol/L 133 Low    Potassium 3.6 - 5.1 mmol/L 4.5   Chloride 97 - 109 mmol/L 96 Low    Carbon Dioxide (CO2) 22.0 - 32.0 mmol/L 29.9   Urea Nitrogen (BUN) 7 - 25 mg/dL 8   Creatinine 0.7 - 1.3 mg/dL 0.8   Glomerular Filtration Rate (eGFR), MDRD Estimate >60 mL/min/1.73sq m 97   Calcium 8.7 - 10.3 mg/dL 9.5   AST  8 - 39 U/L 20   ALT  6 - 57 U/L 15   Alk Phos (alkaline Phosphatase) 34 - 104 U/L 120 High    Albumin 3.5 - 4.8 g/dL 4.2   Bilirubin, Total 0.3 - 1.2 mg/dL 0.7   Protein, Total 6.1 - 7.9 g/dL 6.4   A/G Ratio 1.0 - 5.0 gm/dL 1.9   Resulting Agency  [facility] -  LAB  Specimen Collected: 07/01/21 09:51 Last Resulted: 07/01/21 12:16  Received From: [facility]  Result Received: 07/08/21 13:55   Hemoglobin A1C 4.2 - 5.6 % 5.9 High    Average Blood Glucose (Calc) mg/dL [place]  Narrative Performed by [facility] - [facility] Normal Range:    4.2 - 5.6%  Increased Risk:  5.7 - 6.4%  Diabetes:        >= 6.5%  Glycemic Control for adults with diabetes:  <7%   Specimen Collected: 07/01/21 09:51 Last Resulted: 07/01/21 11:00  Received From: [facility]  Result Received: 07/08/21 13:55   Cholesterol, Total 100 - 200 mg/dL 131   Triglyceride 35 - 199 mg/dL 103   HDL (High Density Lipoprotein) Cholesterol 29.0 - 71.0 mg/dL 46.0   LDL Calculated 0 - 130 mg/dL 64   VLDL Cholesterol mg/dL 21   Cholesterol/HDL Ratio  2.8   Resulting Agency  [facility] - LAB  Specimen Collected: 07/01/21 09:51 Last Resulted: 07/01/21 12:16  Received From: [facility]  Result Received: 07/08/21 13:55   Lab Results  Component Value Date   WBC 6.5 05/21/2021   HGB 14.6  05/21/2021   HCT 40.8 05/21/2021   MCV 95.1 05/21/2021   PLT 304 05/21/2021   Lab Results  Component Value Date   CREATININE 0.70 07/20/2021  I have reviewed the labs.   Pertinent Imaging Results for CAYLEB, JARNIGAN (MRN [id number]) as of 08/18/2021 09:46  Ref. Range 08/18/2021 09:36  Scan Result Unknown 26   Assessment & Plan:    1. BPH with LUTS -UA benign -PVR < 300 cc  -symptoms - nocturia -continue conservative management, avoiding bladder irritants and timed voiding's -We will have a trial of tadalafil 5 mg daily once cleared by cardiology   2. Erectile dysfunction -has mild Peyronie's disease - discussed Xiaflex treatment, but he states his curve is less than 30 degrees  -given hand out on penile modeling  3. Nocturia - I explained to the patient that nocturia is often multi-factorial and difficult to treat.  Sleeping disorders, heart conditions, peripheral vascular disease, diabetes, an enlarged prostate for men, an urethral stricture causing bladder outlet obstruction and/or certain medications can contribute to nocturia. - I have suggested that the patient avoid caffeine after noon and alcohol in the evening.  He or she may also benefit from fluid restrictions after 6:00 in the evening and voiding just prior to bedtime. - I have explained that research studies have showed that over 84% of patients with sleep apnea reported frequent nighttime urination.   With sleep apnea, oxygen decreases, carbon dioxide increases, the blood become more acidic, the heart rate drops and blood vessels in the lung constrict.  The body is then alerted that something is very wrong. The sleeper must wake enough to reopen the airway. By this time, the heart is racing and experiences a false signal of fluid overload. The heart excretes a hormone-like protein that tells the body to get rid of sodium and water, resulting in nocturia. -  I also informed the patient that a recent study noted that  decreasing sodium intake to 2.3 grams daily, if they don't have issues with hyponatremia, can also reduce the number of nightly voids - encouraged to sleep with oxygen -discontinue the Myrbetriq at this time to see if symptoms worsen   4. Left renal stone -No intervention warranted at this  time  Return in about 1 month (around 09/18/2021) for IPSS and PVR.  Zara Council, PA-C [facility] [address], [place], [place] [address] [phone]

## 2021-08-18 ENCOUNTER — Ambulatory Visit (INDEPENDENT_AMBULATORY_CARE_PROVIDER_SITE_OTHER): Payer: BC Managed Care – PPO | Admitting: Urology

## 2021-08-18 ENCOUNTER — Ambulatory Visit: Payer: Self-pay | Admitting: Urology

## 2021-08-18 ENCOUNTER — Encounter: Payer: Self-pay | Admitting: Urology

## 2021-08-18 ENCOUNTER — Ambulatory Visit
Admission: RE | Admit: 2021-08-18 | Discharge: 2021-08-18 | Disposition: A | Discharge status: Home / Self Care | Payer: BC Managed Care – PPO | Source: Ambulatory Visit | Attending: Radiation Oncology | Admitting: Radiation Oncology

## 2021-08-18 ENCOUNTER — Other Ambulatory Visit: Payer: Self-pay

## 2021-08-18 ENCOUNTER — Telehealth: Payer: Self-pay | Admitting: Urology

## 2021-08-18 VITALS — BP 136/87 | HR 99 | Ht 72.0 in | Wt 194.0 lb

## 2021-08-18 DIAGNOSIS — N200 Calculus of kidney: Secondary | ICD-10-CM | POA: Diagnosis not present

## 2021-08-18 DIAGNOSIS — N529 Male erectile dysfunction, unspecified: Secondary | ICD-10-CM | POA: Diagnosis not present

## 2021-08-18 DIAGNOSIS — Z510 Encounter for antineoplastic radiation therapy: Secondary | ICD-10-CM | POA: Diagnosis not present

## 2021-08-18 DIAGNOSIS — N401 Enlarged prostate with lower urinary tract symptoms: Secondary | ICD-10-CM | POA: Diagnosis not present

## 2021-08-18 DIAGNOSIS — R351 Nocturia: Secondary | ICD-10-CM

## 2021-08-18 DIAGNOSIS — N138 Other obstructive and reflux uropathy: Secondary | ICD-10-CM

## 2021-08-18 LAB — BLADDER SCAN AMB NON-IMAGING: Scan Result: 26

## 2021-08-18 NOTE — Telephone Encounter (Signed)
Cardiac Clearance request faxed.

## 2021-08-18 NOTE — Telephone Encounter (Signed)
Would you get cardiac clearance for daily tadalafil 5 mg for BPH from Dr. Nehemiah Massed?

## 2021-08-19 ENCOUNTER — Telehealth: Payer: Self-pay | Admitting: *Deleted

## 2021-08-19 NOTE — Telephone Encounter (Signed)
Paul Carpenter called from Dr. Nehemiah Massed office and states patient needs to have an appt there first . Patient needs to call and make an appt.

## 2021-08-19 NOTE — Telephone Encounter (Signed)
Advised patient to make at appt at Dr. Nehemiah Massed office

## 2021-08-20 ENCOUNTER — Ambulatory Visit
Admission: RE | Admit: 2021-08-20 | Discharge: 2021-08-20 | Disposition: A | Discharge status: Home / Self Care | Payer: BC Managed Care – PPO | Source: Ambulatory Visit | Attending: Radiation Oncology | Admitting: Radiation Oncology

## 2021-08-20 DIAGNOSIS — Z510 Encounter for antineoplastic radiation therapy: Secondary | ICD-10-CM | POA: Diagnosis not present

## 2021-08-25 ENCOUNTER — Ambulatory Visit: Payer: BC Managed Care – PPO

## 2021-08-25 ENCOUNTER — Ambulatory Visit: Admission: RE | Admit: 2021-08-25 | Payer: BC Managed Care – PPO | Source: Ambulatory Visit

## 2021-08-27 ENCOUNTER — Ambulatory Visit
Admission: RE | Admit: 2021-08-27 | Discharge: 2021-08-27 | Disposition: A | Discharge status: Home / Self Care | Payer: BC Managed Care – PPO | Source: Ambulatory Visit | Attending: Radiation Oncology | Admitting: Radiation Oncology

## 2021-08-27 ENCOUNTER — Other Ambulatory Visit: Payer: Self-pay

## 2021-08-27 ENCOUNTER — Inpatient Hospital Stay: Payer: BC Managed Care – PPO | Attending: Oncology

## 2021-08-27 DIAGNOSIS — Z452 Encounter for adjustment and management of vascular access device: Secondary | ICD-10-CM | POA: Insufficient documentation

## 2021-08-27 DIAGNOSIS — C342 Malignant neoplasm of middle lobe, bronchus or lung: Secondary | ICD-10-CM | POA: Insufficient documentation

## 2021-08-27 DIAGNOSIS — Z510 Encounter for antineoplastic radiation therapy: Secondary | ICD-10-CM | POA: Diagnosis not present

## 2021-08-27 DIAGNOSIS — Z95828 Presence of other vascular implants and grafts: Secondary | ICD-10-CM

## 2021-08-27 MED ORDER — HEPARIN SOD (PORK) LOCK FLUSH 100 UNIT/ML IV SOLN
INTRAVENOUS | Status: AC
  Administered 2021-08-27: 500 [IU] via INTRAVENOUS
  Filled 2021-08-27: qty 5

## 2021-08-27 MED ORDER — SODIUM CHLORIDE 0.9% FLUSH
10.0000 mL | INTRAVENOUS | Status: DC | PRN
  Administered 2021-08-27: 10 mL via INTRAVENOUS
  Filled 2021-08-27: qty 10

## 2021-08-27 MED ORDER — HEPARIN SOD (PORK) LOCK FLUSH 100 UNIT/ML IV SOLN
500.0000 [IU] | Freq: Once | INTRAVENOUS | Status: AC
  Filled 2021-08-27: qty 5

## 2021-08-27 MED ORDER — SODIUM CHLORIDE 0.9% FLUSH
10.0000 mL | Freq: Once | INTRAVENOUS | Status: DC
  Filled 2021-08-27: qty 10

## 2021-08-27 MED ORDER — HEPARIN SOD (PORK) LOCK FLUSH 100 UNIT/ML IV SOLN
500.0000 [IU] | Freq: Once | INTRAVENOUS | Status: DC
  Filled 2021-08-27: qty 5

## 2021-08-31 ENCOUNTER — Ambulatory Visit: Payer: BC Managed Care – PPO

## 2021-09-01 ENCOUNTER — Ambulatory Visit: Payer: BC Managed Care – PPO

## 2021-09-01 ENCOUNTER — Ambulatory Visit
Admission: RE | Admit: 2021-09-01 | Discharge: 2021-09-01 | Disposition: A | Discharge status: Home / Self Care | Payer: BC Managed Care – PPO | Source: Ambulatory Visit | Attending: Radiation Oncology | Admitting: Radiation Oncology

## 2021-09-01 DIAGNOSIS — Z510 Encounter for antineoplastic radiation therapy: Secondary | ICD-10-CM | POA: Insufficient documentation

## 2021-09-01 DIAGNOSIS — C3411 Malignant neoplasm of upper lobe, right bronchus or lung: Secondary | ICD-10-CM | POA: Diagnosis present

## 2021-09-01 DIAGNOSIS — F17210 Nicotine dependence, cigarettes, uncomplicated: Secondary | ICD-10-CM | POA: Diagnosis not present

## 2021-09-02 ENCOUNTER — Ambulatory Visit: Payer: BC Managed Care – PPO

## 2021-09-03 ENCOUNTER — Ambulatory Visit
Admission: RE | Admit: 2021-09-03 | Discharge: 2021-09-03 | Disposition: A | Discharge status: Home / Self Care | Payer: BC Managed Care – PPO | Source: Ambulatory Visit | Attending: Radiation Oncology | Admitting: Radiation Oncology

## 2021-09-03 DIAGNOSIS — C3411 Malignant neoplasm of upper lobe, right bronchus or lung: Secondary | ICD-10-CM | POA: Diagnosis not present

## 2021-09-06 ENCOUNTER — Other Ambulatory Visit: Payer: Self-pay | Admitting: Urology

## 2021-09-15 NOTE — Telephone Encounter (Signed)
Please read telephone note from 08/19/2021, pt need appt with cardiology. I can't decide if pt cancels appt, I will route to shannon as well.

## 2021-09-15 NOTE — Telephone Encounter (Signed)
Pt hasn't received cardiac clearance yet and wants to know if he should keep appt for this Friday or reschedule.

## 2021-09-15 NOTE — Telephone Encounter (Signed)
Spoke with patient and advised results Appt scheduled

## 2021-09-18 ENCOUNTER — Ambulatory Visit: Payer: BC Managed Care – PPO | Admitting: Urology

## 2021-09-21 ENCOUNTER — Emergency Department: Payer: BC Managed Care – PPO

## 2021-09-21 ENCOUNTER — Telehealth: Payer: Self-pay | Admitting: *Deleted

## 2021-09-21 ENCOUNTER — Other Ambulatory Visit: Payer: Self-pay

## 2021-09-21 ENCOUNTER — Observation Stay
Admission: EM | Admit: 2021-09-21 | Discharge: 2021-09-22 | Disposition: A | Discharge status: Home / Self Care | Payer: BC Managed Care – PPO | Attending: Internal Medicine | Admitting: Internal Medicine

## 2021-09-21 DIAGNOSIS — E039 Hypothyroidism, unspecified: Secondary | ICD-10-CM | POA: Diagnosis present

## 2021-09-21 DIAGNOSIS — Z85118 Personal history of other malignant neoplasm of bronchus and lung: Secondary | ICD-10-CM | POA: Diagnosis not present

## 2021-09-21 DIAGNOSIS — I5020 Unspecified systolic (congestive) heart failure: Secondary | ICD-10-CM | POA: Diagnosis present

## 2021-09-21 DIAGNOSIS — E871 Hypo-osmolality and hyponatremia: Principal | ICD-10-CM

## 2021-09-21 DIAGNOSIS — Z20822 Contact with and (suspected) exposure to covid-19: Secondary | ICD-10-CM | POA: Diagnosis not present

## 2021-09-21 DIAGNOSIS — I110 Hypertensive heart disease with heart failure: Secondary | ICD-10-CM | POA: Insufficient documentation

## 2021-09-21 DIAGNOSIS — J449 Chronic obstructive pulmonary disease, unspecified: Secondary | ICD-10-CM | POA: Diagnosis present

## 2021-09-21 DIAGNOSIS — C3491 Malignant neoplasm of unspecified part of right bronchus or lung: Secondary | ICD-10-CM | POA: Diagnosis present

## 2021-09-21 DIAGNOSIS — F17200 Nicotine dependence, unspecified, uncomplicated: Secondary | ICD-10-CM | POA: Diagnosis present

## 2021-09-21 DIAGNOSIS — I2510 Atherosclerotic heart disease of native coronary artery without angina pectoris: Secondary | ICD-10-CM | POA: Diagnosis not present

## 2021-09-21 DIAGNOSIS — J45909 Unspecified asthma, uncomplicated: Secondary | ICD-10-CM | POA: Diagnosis not present

## 2021-09-21 DIAGNOSIS — E782 Mixed hyperlipidemia: Secondary | ICD-10-CM | POA: Diagnosis present

## 2021-09-21 DIAGNOSIS — F17210 Nicotine dependence, cigarettes, uncomplicated: Secondary | ICD-10-CM | POA: Diagnosis not present

## 2021-09-21 DIAGNOSIS — I10 Essential (primary) hypertension: Secondary | ICD-10-CM | POA: Diagnosis present

## 2021-09-21 DIAGNOSIS — R531 Weakness: Secondary | ICD-10-CM | POA: Diagnosis present

## 2021-09-21 DIAGNOSIS — D696 Thrombocytopenia, unspecified: Secondary | ICD-10-CM | POA: Diagnosis present

## 2021-09-21 DIAGNOSIS — Z79899 Other long term (current) drug therapy: Secondary | ICD-10-CM | POA: Diagnosis not present

## 2021-09-21 DIAGNOSIS — E663 Overweight: Secondary | ICD-10-CM | POA: Diagnosis present

## 2021-09-21 DIAGNOSIS — E222 Syndrome of inappropriate secretion of antidiuretic hormone: Secondary | ICD-10-CM | POA: Diagnosis present

## 2021-09-21 DIAGNOSIS — G4733 Obstructive sleep apnea (adult) (pediatric): Secondary | ICD-10-CM | POA: Diagnosis present

## 2021-09-21 LAB — PROCALCITONIN: Procalcitonin: <0.1 ng/mL

## 2021-09-21 LAB — OSMOLALITY, URINE: Osmolality, Ur: 405 mOsm/kg (ref 300–900)

## 2021-09-21 LAB — COMPREHENSIVE METABOLIC PANEL
ALT: 37 U/L (ref 0–44)
AST: 31 U/L (ref 15–41)
Albumin: 2.8 g/dL — ABNORMAL LOW (ref 3.5–5.0)
Alkaline Phosphatase: 91 U/L (ref 38–126)
Anion gap: 7 (ref 5–15)
BUN: 8 mg/dL (ref 8–23)
CO2: 28 mmol/L (ref 22–32)
Calcium: 8.6 mg/dL — ABNORMAL LOW (ref 8.9–10.3)
Chloride: 85 mmol/L — ABNORMAL LOW (ref 98–111)
Creatinine, Ser: 0.59 mg/dL — ABNORMAL LOW (ref 0.61–1.24)
GFR, Estimated: >60 mL/min (ref >60)
Glucose, Bld: 91 mg/dL (ref 70–99)
Potassium: 4.3 mmol/L (ref 3.5–5.1)
Sodium: 120 mmol/L — ABNORMAL LOW (ref 135–145)
Total Bilirubin: 0.4 mg/dL (ref 0.3–1.2)
Total Protein: 6.5 g/dL (ref 6.5–8.1)

## 2021-09-21 LAB — CBC
HCT: 31.6 % — ABNORMAL LOW (ref 39.0–52.0)
Hemoglobin: 11 g/dL — ABNORMAL LOW (ref 13.0–17.0)
MCH: 32.4 pg (ref 26.0–34.0)
MCHC: 34.8 g/dL (ref 30.0–36.0)
MCV: 93.2 fL (ref 80.0–100.0)
Platelets: 326 10*3/uL (ref 150–400)
RBC: 3.39 MIL/uL — ABNORMAL LOW (ref 4.22–5.81)
RDW: 11.9 % (ref 11.5–15.5)
WBC: 8.5 10*3/uL (ref 4.0–10.5)
nRBC: 0 % (ref 0.0–0.2)

## 2021-09-21 LAB — HIV ANTIBODY (ROUTINE TESTING W REFLEX): HIV Screen 4th Generation wRfx: NONREACTIVE

## 2021-09-21 LAB — URIC ACID: Uric Acid, Serum: 2.2 mg/dL — ABNORMAL LOW (ref 3.7–8.6)

## 2021-09-21 LAB — RESP PANEL BY RT-PCR (FLU A&B, COVID) ARPGX2
Influenza A by PCR: NEGATIVE
Influenza B by PCR: NEGATIVE
SARS Coronavirus 2 by RT PCR: NEGATIVE

## 2021-09-21 LAB — PHOSPHORUS: Phosphorus: 3.6 mg/dL (ref 2.5–4.6)

## 2021-09-21 LAB — MAGNESIUM: Magnesium: 1.4 mg/dL — ABNORMAL LOW (ref 1.7–2.4)

## 2021-09-21 LAB — SODIUM: Sodium: 128 mmol/L — ABNORMAL LOW (ref 135–145)

## 2021-09-21 LAB — SODIUM, URINE, RANDOM: Sodium, Ur: 80 mmol/L

## 2021-09-21 LAB — TROPONIN I (HIGH SENSITIVITY)
Troponin I (High Sensitivity): 4 ng/L (ref <18)
Troponin I (High Sensitivity): 5 ng/L (ref <18)

## 2021-09-21 LAB — OSMOLALITY: Osmolality: 251 mOsm/kg — ABNORMAL LOW (ref 275–295)

## 2021-09-21 MED ORDER — MENTHOL 3 MG MT LOZG
1.0000 | LOZENGE | OROMUCOSAL | Status: DC | PRN
  Filled 2021-09-21: qty 9

## 2021-09-21 MED ORDER — ONDANSETRON HCL 4 MG PO TABS: 4.0000 mg | ORAL_TABLET | Freq: Four times a day (QID) | ORAL | Status: DC | PRN

## 2021-09-21 MED ORDER — METOPROLOL SUCCINATE ER 25 MG PO TB24
25.0000 mg | ORAL_TABLET | Freq: Every day | ORAL | Status: DC
  Administered 2021-09-22: 09:00:00 25 mg via ORAL
  Filled 2021-09-21: qty 1

## 2021-09-21 MED ORDER — DOXYCYCLINE HYCLATE 100 MG PO TABS
100.0000 mg | ORAL_TABLET | Freq: Two times a day (BID) | ORAL | Status: DC
  Administered 2021-09-21 – 2021-09-22 (×3): 100 mg via ORAL
  Filled 2021-09-21 (×3): qty 1

## 2021-09-21 MED ORDER — ENOXAPARIN SODIUM 40 MG/0.4ML IJ SOSY
40.0000 mg | PREFILLED_SYRINGE | INTRAMUSCULAR | Status: DC
  Administered 2021-09-21: 40 mg via SUBCUTANEOUS
  Filled 2021-09-21: qty 0.4

## 2021-09-21 MED ORDER — LEVOTHYROXINE SODIUM 50 MCG PO TABS
150.0000 ug | ORAL_TABLET | Freq: Every day | ORAL | Status: DC
  Administered 2021-09-22: 06:00:00 150 ug via ORAL
  Filled 2021-09-21: qty 1

## 2021-09-21 MED ORDER — LIDOCAINE 5 % EX PTCH
1.0000 | MEDICATED_PATCH | CUTANEOUS | Status: DC
  Administered 2021-09-21: 20:00:00 1 via TRANSDERMAL
  Filled 2021-09-21 (×2): qty 1

## 2021-09-21 MED ORDER — MAGNESIUM SULFATE 2 GM/50ML IV SOLN
2.0000 g | Freq: Once | INTRAVENOUS | Status: AC
  Administered 2021-09-21: 2 g via INTRAVENOUS
  Filled 2021-09-21: qty 50

## 2021-09-21 MED ORDER — FLUTICASONE FUROATE-VILANTEROL 200-25 MCG/ACT IN AEPB
1.0000 | INHALATION_SPRAY | Freq: Every day | RESPIRATORY_TRACT | Status: DC
  Administered 2021-09-22: 1 via RESPIRATORY_TRACT
  Filled 2021-09-21: qty 28

## 2021-09-21 MED ORDER — IPRATROPIUM-ALBUTEROL 0.5-2.5 (3) MG/3ML IN SOLN: 3.0000 mL | Freq: Four times a day (QID) | RESPIRATORY_TRACT | Status: DC | PRN

## 2021-09-21 MED ORDER — PANTOPRAZOLE SODIUM 40 MG PO TBEC
40.0000 mg | DELAYED_RELEASE_TABLET | Freq: Two times a day (BID) | ORAL | Status: DC
  Administered 2021-09-21 – 2021-09-22 (×2): 40 mg via ORAL
  Filled 2021-09-21 (×2): qty 1

## 2021-09-21 MED ORDER — NITROGLYCERIN 0.4 MG SL SUBL: 0.4000 mg | SUBLINGUAL_TABLET | SUBLINGUAL | Status: DC | PRN

## 2021-09-21 MED ORDER — ALPRAZOLAM 0.25 MG PO TABS
0.2500 mg | ORAL_TABLET | Freq: Two times a day (BID) | ORAL | Status: DC | PRN
  Administered 2021-09-21 – 2021-09-22 (×2): 0.25 mg via ORAL
  Filled 2021-09-21 (×2): qty 1

## 2021-09-21 MED ORDER — NICOTINE 21 MG/24HR TD PT24
21.0000 mg | MEDICATED_PATCH | Freq: Every day | TRANSDERMAL | Status: DC | PRN
  Administered 2021-09-21: 21 mg via TRANSDERMAL
  Filled 2021-09-21 (×2): qty 1

## 2021-09-21 MED ORDER — TOLVAPTAN 15 MG PO TABS
15.0000 mg | ORAL_TABLET | Freq: Once | ORAL | Status: AC
  Administered 2021-09-21: 16:00:00 15 mg via ORAL
  Filled 2021-09-21 (×2): qty 1

## 2021-09-21 MED ORDER — MORPHINE SULFATE (PF) 2 MG/ML IV SOLN
1.0000 mg | INTRAVENOUS | Status: DC | PRN
  Administered 2021-09-21: 20:00:00 1 mg via INTRAVENOUS
  Filled 2021-09-21: qty 1

## 2021-09-21 MED ORDER — ONDANSETRON HCL 4 MG/2ML IJ SOLN: 4.0000 mg | Freq: Four times a day (QID) | INTRAMUSCULAR | Status: DC | PRN

## 2021-09-21 MED ORDER — SODIUM CHLORIDE 0.9 % IV SOLN: INTRAVENOUS | Status: DC

## 2021-09-21 MED ORDER — ATORVASTATIN CALCIUM 20 MG PO TABS
40.0000 mg | ORAL_TABLET | Freq: Every day | ORAL | Status: DC
  Administered 2021-09-21: 40 mg via ORAL
  Filled 2021-09-21: qty 2

## 2021-09-21 MED ORDER — ACETAMINOPHEN 650 MG RE SUPP: 650.0000 mg | Freq: Four times a day (QID) | RECTAL | Status: DC | PRN

## 2021-09-21 MED ORDER — UMECLIDINIUM BROMIDE 62.5 MCG/ACT IN AEPB
1.0000 | INHALATION_SPRAY | Freq: Every day | RESPIRATORY_TRACT | Status: DC
  Administered 2021-09-22: 09:00:00 1 via RESPIRATORY_TRACT
  Filled 2021-09-21: qty 7

## 2021-09-21 MED ORDER — ALBUTEROL SULFATE (2.5 MG/3ML) 0.083% IN NEBU: 3.0000 mL | INHALATION_SOLUTION | RESPIRATORY_TRACT | Status: DC | PRN

## 2021-09-21 MED ORDER — SODIUM CHLORIDE 0.9 % IV BOLUS
500.0000 mL | Freq: Once | INTRAVENOUS | Status: AC
  Administered 2021-09-21: 500 mL via INTRAVENOUS

## 2021-09-21 MED ORDER — FLUTICASONE-UMECLIDIN-VILANT 200-62.5-25 MCG/ACT IN AEPB: 200.0000 ug | INHALATION_SPRAY | Freq: Every day | RESPIRATORY_TRACT | Status: DC

## 2021-09-21 MED ORDER — NICOTINE 14 MG/24HR TD PT24: 14.0000 mg | MEDICATED_PATCH | Freq: Every day | TRANSDERMAL | Status: DC | PRN

## 2021-09-21 MED ORDER — ACETAMINOPHEN 325 MG PO TABS
650.0000 mg | ORAL_TABLET | Freq: Four times a day (QID) | ORAL | Status: DC | PRN
  Administered 2021-09-21: 19:00:00 650 mg via ORAL
  Filled 2021-09-21: qty 2

## 2021-09-21 NOTE — Progress Notes (Signed)
[place] for Tolvaptan Indication: hyponatremia  Allergies  Allergen Reactions   Lisinopril Rash   Varenicline Rash    Patient Measurements: Height: 6' (182.9 cm) Weight: 88.5 kg (195 lb) IBW/kg (Calculated) : 77.6  Intake/Output from previous day: No intake/output data recorded. Intake/Output from this shift: Total I/O In: 35 [I.V.:35] Out: -   Labs: Recent Labs    09/21/21 1117  WBC 8.5  HGB 11.0*  HCT 31.6*  PLT 326  CREATININE 0.59*  MG 1.4*  PHOS 3.6  ALBUMIN 2.8*  PROT 6.5  AST 31  ALT 37  ALKPHOS 91  BILITOT 0.4   Estimated Creatinine Clearance: 102.4 mL/min (A) (by C-G formula based on SCr of 0.59 mg/dL (L)).  Medical History: Past Medical History:  Diagnosis Date   Anginal pain ([place])    Anxiety    Asthma    Chest pain    CHF (congestive heart failure) (HCC)    Chicken pox    Complication of anesthesia    o2 dropped after neck fusion   COPD (chronic obstructive pulmonary disease) (HCC)    Coronary artery disease    Cough    chronic  clear phlegm   Dysrhythmia    palpitations   GERD (gastroesophageal reflux disease)    h/o reflux/ hoarsness   Hematochezia    Hemorrhoids    History of chickenpox    History of colon polyps    History of Helicobacter pylori infection    Hoarseness    Hypertension    Lung cancer ([place]) 05/2016   Chemo + rad tx's.    Migraines    OSA (obstructive sleep apnea)    has CPAP but does not use   Personal history of tobacco use, presenting hazards to health 03/05/2016   Pneumonia    5/17   Raynaud disease    Raynaud disease    Raynaud's disease    Rotator cuff tear    on right   Shortness of breath dyspnea    Sleep apnea    Ulcer (traumatic) of oral mucosa     Assessment: 65 y.o. male with medical history significant for left-sided non-small cell carcinoma, status post 5 rounds of radiation and completed chemotherapy in 2019 via right sided Port-A-Cath,  hypertension, current tobacco use, COPD, hyperlipidemia, anxiety, depression, obstructive sleep apnea, with noncompliance of CPAP use, Raynaud's disease, who presents to the emergency department from [facility] clinic for chief concerns of hyponatremia. Pharmacy has been consulted for tolvaptan monitoring.   Goal of Therapy:  Na WNL (pt usual sodium ranges between 127-133)  Plan:  Tolvaptan 15 mg PO x 1 dose Monitor Na every 8 hours for 24 hours. Hold tolvaptan and notify MD if sodium increases more than 8 mEq/L in 8 hours OR greater than 12 mEq/L in 24 hours.   Bellarose Burtt O Wayne Brunker 09/21/2021,3:21 PM

## 2021-09-21 NOTE — Telephone Encounter (Signed)
Pt's wife left me a message as well. Dr. Grayland Ormond made aware and in agreement with ED evaluation. Message left with pt's wife to make her aware.

## 2021-09-21 NOTE — ED Notes (Signed)
C/o chronic shob. EKG performed

## 2021-09-21 NOTE — Progress Notes (Signed)
Pt states headache improved with tylenol. Still has chest pain and SOB. Pt explains SOB is not different from his baseline since being diagnosed with lung cancer and having chemoradiation. When asked if he uses oxygen at home, pt states, "I don't but I'm supposed to." Does not want any oxygen at present either. Pt states chest pain isn't new either, but refuses nitro. Pt agreed to try morphine and lidocaine patch ordered by Dr. Tobie Poet.

## 2021-09-21 NOTE — Consult Note (Signed)
[facility] Consult Note: 09/21/21    Date of Admission:  09/21/2021           Reason for Consult:  hyponatremia   Referring Provider: Criss Alvine, DO Primary Care Provider: Kirk Ruths, MD   History of Presenting Illness:  Paul Carpenter is a 65 y.o. male  With significant medical history of non-small cell lung cancer status post radiation and chemotherapy, ongoing 2 pack/day smoking, COPD, hyperlipidemia, anxiety, depression, obstructive sleep apnea-not using CPAP, Raynaud's disease. His labs were checked as outpatient by PCP and found to have low sodium.  Over the weekend, labs were repeated and he continued to have low sodium. Today he was referred to the emergency room where he was found to have a sodium of 120.  He is admitted for further evaluation and management. Nephrology consult has been requested for evaluation Patient today reports that he has been feeling fatigued.  He is appetite is fair.  He is able to void without any problem.  No blood in the stool or urine.  Some shortness of breath and associated chest pain.  Review of Systems: ROS Gen: Denies any fevers or chills HEENT: No vision or hearing problems CV: + chest pain and shortness of breath Resp: No cough or sputum production GI: No nausea, vomiting or diarrhea.  No blood in the stool GU : No problems with voiding.  No hematuria.   MS: Ambulatory.  Denies any acute joint pain or swelling Derm:   No complaints Psych: No complaints Heme: No complaints.  Dealing with lung cancer as described above Neuro: No complaints Endocrine: No complaints   Past Medical History:  Diagnosis Date   Anginal pain (HCC)    Anxiety    Asthma    Chest pain    CHF (congestive heart failure) (HCC)    Chicken pox    Complication of anesthesia    o2 dropped after neck fusion   COPD (chronic obstructive pulmonary disease) (HCC)    Coronary artery disease    Cough    chronic  clear phlegm    Dysrhythmia    palpitations   GERD (gastroesophageal reflux disease)    h/o reflux/ hoarsness   Hematochezia    Hemorrhoids    History of chickenpox    History of colon polyps    History of Helicobacter pylori infection    Hoarseness    Hypertension    Lung cancer ([place]) 05/2016   Chemo + rad tx's.    Migraines    OSA (obstructive sleep apnea)    has CPAP but does not use   Personal history of tobacco use, presenting hazards to health 03/05/2016   Pneumonia    5/17   Raynaud disease    Raynaud disease    Raynaud's disease    Rotator cuff tear    on right   Shortness of breath dyspnea    Sleep apnea    Ulcer (traumatic) of oral mucosa     Social History   Tobacco Use   Smoking status: Every Day    Packs/day: 3.00    Years: 45.00    Pack years: 135.00    Types: Cigarettes   Smokeless tobacco: Never   Tobacco comments:    2 PPD 04/21/2021  Vaping Use   Vaping Use: Never used  Substance Use Topics   Alcohol use: Yes    Alcohol/week: 2.0 standard drinks    Types: 2 Standard drinks or equivalent per week  Comment: moderate   Drug use: No    Family History  Problem Relation Age of Onset   Heart disease Father    Prostate cancer Father    Heart disease Paternal Grandmother    Heart attack Maternal Grandfather 80   Kidney cancer Neg Hx    Bladder Cancer Neg Hx    Other Neg Hx        pituitary abnormality     OBJECTIVE: Blood pressure 132/78, pulse 76, temperature 97.9 F (36.6 C), temperature source Oral, resp. rate (!) 22, height 6' (1.829 m), weight 88.5 kg, SpO2 94 %.  Physical Exam Physical Exam: General:  No acute distress, laying in the bed  HEENT  anicteric, moist oral mucous membrane  Pulm/lungs  normal breathing effort, decreased breath sounds, no crackles  CVS/Heart  regular rhythm, no rub or gallop  Abdomen:   Soft, nontender  Extremities:  No peripheral edema  Neurologic:  Alert, oriented, able to follow commands  Skin:  No acute rashes      Lab Results Lab Results  Component Value Date   WBC 8.5 09/21/2021   HGB 11.0 (L) 09/21/2021   HCT 31.6 (L) 09/21/2021   MCV 93.2 09/21/2021   PLT 326 09/21/2021    Lab Results  Component Value Date   CREATININE 0.59 (L) 09/21/2021   BUN 8 09/21/2021   NA 120 (L) 09/21/2021   K 4.3 09/21/2021   CL 85 (L) 09/21/2021   CO2 28 09/21/2021    Lab Results  Component Value Date   ALT 37 09/21/2021   AST 31 09/21/2021   ALKPHOS 91 09/21/2021   BILITOT 0.4 09/21/2021     Microbiology: Recent Results (from the past 240 hour(s))  Resp Panel by RT-PCR (Flu A&B, Covid) Nasopharyngeal Swab     Status: None   Collection Time: 09/21/21 11:17 AM   Specimen: Nasopharyngeal Swab; Nasopharyngeal(NP) swabs in vial transport medium  Result Value Ref Range Status   SARS Coronavirus 2 by RT PCR NEGATIVE NEGATIVE Final    Comment: (NOTE) SARS-CoV-2 target nucleic acids are NOT DETECTED.  The SARS-CoV-2 RNA is generally detectable in upper respiratory specimens during the acute phase of infection. The lowest concentration of SARS-CoV-2 viral copies this assay can detect is 138 copies/mL. A negative result does not preclude SARS-Cov-2 infection and should not be used as the sole basis for treatment or other patient management decisions. A negative result may occur with  improper specimen collection/handling, submission of specimen other than nasopharyngeal swab, presence of viral mutation(s) within the areas targeted by this assay, and inadequate number of viral copies(<138 copies/mL). A negative result must be combined with clinical observations, patient history, and epidemiological information. The expected result is Negative.  Fact Sheet for Patients:  EntrepreneurPulse.com.au  Fact Sheet for Healthcare Providers:  IncredibleEmployment.be  This test is no t yet approved or cleared by the [place] FDA and  has been authorized for  detection and/or diagnosis of SARS-CoV-2 by FDA under an Emergency Use Authorization (EUA). This EUA will remain  in effect (meaning this test can be used) for the duration of the COVID-19 declaration under Section 564(b)(1) of the Act, 21 U.S.C.section 360bbb-3(b)(1), unless the authorization is terminated  or revoked sooner.       Influenza A by PCR NEGATIVE NEGATIVE Final   Influenza B by PCR NEGATIVE NEGATIVE Final    Comment: (NOTE) The Xpert Xpress SARS-CoV-2/FLU/RSV plus assay is intended as an aid in the diagnosis of influenza from  Nasopharyngeal swab specimens and should not be used as a sole basis for treatment. Nasal washings and aspirates are unacceptable for Xpert Xpress SARS-CoV-2/FLU/RSV testing.  Fact Sheet for Patients: EntrepreneurPulse.com.au  Fact Sheet for Healthcare Providers: IncredibleEmployment.be  This test is not yet approved or cleared by the [place] FDA and has been authorized for detection and/or diagnosis of SARS-CoV-2 by FDA under an Emergency Use Authorization (EUA). This EUA will remain in effect (meaning this test can be used) for the duration of the COVID-19 declaration under Section 564(b)(1) of the Act, 21 U.S.C. section 360bbb-3(b)(1), unless the authorization is terminated or revoked.  Performed at [facility], [address] [place], [place] [address]     Medications: Scheduled Meds:  atorvastatin  40 mg Oral QHS   doxycycline  100 mg Oral BID   enoxaparin (LOVENOX) injection  40 mg Subcutaneous Q24H   [START ON 09/22/2021] Fluticasone-Umeclidin-Vilant  200 mcg Inhalation Daily   [START ON 09/22/2021] levothyroxine  150 mcg Oral Q0600   [START ON 09/22/2021] metoprolol succinate  25 mg Oral Daily   pantoprazole  40 mg Oral BID   Continuous Infusions:  sodium chloride     PRN Meds:.acetaminophen **OR** acetaminophen, albuterol, ALPRAZolam, ipratropium-albuterol, nicotine,  nitroGLYCERIN, ondansetron **OR** ondansetron (ZOFRAN) IV  Allergies  Allergen Reactions   Lisinopril Rash   Varenicline Rash    Urinalysis: No results for input(s): COLORURINE, LABSPEC, PHURINE, GLUCOSEU, HGBUR, BILIRUBINUR, KETONESUR, PROTEINUR, UROBILINOGEN, NITRITE, LEUKOCYTESUR in the last 72 hours.  Invalid input(s): APPERANCEUR    Imaging: DG Chest Port 1 View  Result Date: 09/21/2021 CLINICAL DATA:  Weakness. History of lung cancer. Last radiation therapy a few weeks ago. EXAM: PORTABLE CHEST 1 VIEW COMPARISON:  Chest radiograph of 10/09/2019.  Chest CT 07/20/2021 FINDINGS: Lower cervical spine fixation. Right Port-A-Cath tip at low SVC. Numerous leads and wires project over the chest. Midline trachea. Normal heart size. Atherosclerosis in the transverse aorta. No pleural effusion or pneumothorax. Right mid and upper lung interstitial and airspace disease with underlying right apical and upper lobe cavitary lesion. Clear left lung. IMPRESSION: Right upper and mid lung interstitial and airspace disease, which given the clinical history is most likely related to evolving radiation change. Concurrent pneumonia cannot be excluded. No left-sided airspace disease. Aortic Atherosclerosis (ICD10-I70.0). Electronically Signed   By: Abigail Miyamoto M.D.   On: 09/21/2021 12:18      Assessment/Plan:  Paul Carpenter is a 65 y.o. male with medical problems of  non-small cell lung cancer status post radiation and chemotherapy, ongoing 2 pack/day smoking, COPD, hyperlipidemia, anxiety, depression, obstructive sleep apnea-not using CPAP, Raynaud's disease.   was admitted on 09/21/2021 for :  Hyponatremia [E87.1]  Chronic hyponatremia Patient's usual sodium ranges between 1 27-133.  Most recent outpatient level of 133 noted on July 01, 2021. Today sodium has dropped to 120.  Patient reports drinking excessive amount of sodium containing fluids such as Gatorade etc. to try to get his sodium up  over the weekend. Urine osmolality 405, urine sodium 80, chest x-ray with right upper and midlung interstitial and airspace disease most likely radiation change.  Aortic atherosclerosis was noted on chest x-ray.CT scan of the chest from September 2022 shows diffuse bronchial wall thickening with moderate centrilobular and paraseptal emphysema suggesting underlying COPD.  Patient appears euvolemic.  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  Will treat with tolvaptan Once sodium is improved to a reasonable level, will recommend fluid restriction, salt supplementation and  low-dose diuretic.    Conny Situ Candiss Norse 09/21/21

## 2021-09-21 NOTE — ED Notes (Signed)
Pt to ED from [facility] for abnormal labs with a Na of 122. Pt also reports increased weakness over the past couple of days.   Pt has Lung cancer, last radiation tx was a couple weeks ago; has chronic SOB. Pt is currently 98% RA.  Pt is A&OX4, NAD

## 2021-09-21 NOTE — ED Provider Notes (Signed)
[facility] Emergency Department Provider Note   ____________________________________________    I have reviewed the triage vital signs and the nursing notes.   HISTORY  Chief Complaint Weakness and abnormal labs     HPI Paul Carpenter is a 65 y.o. male with history as detailed below, recently completed radiation treatment for lung cancer, does report thyroid abnormalities status post radiation.  Sent by PCP for evaluation of hyponatremia.  Patient reports has visited PCP several times for hyponatremia over the last week, however it continues to decrease, he reports he feels extremely weak and low energy.  Denies chest pain.  Occasional cough.  No fevers or chills reported  Past Medical History:  Diagnosis Date   Anginal pain (HCC)    Anxiety    Asthma    Chest pain    CHF (congestive heart failure) (HCC)    Chicken pox    Complication of anesthesia    o2 dropped after neck fusion   COPD (chronic obstructive pulmonary disease) (HCC)    Coronary artery disease    Cough    chronic  clear phlegm   Dysrhythmia    palpitations   GERD (gastroesophageal reflux disease)    h/o reflux/ hoarsness   Hematochezia    Hemorrhoids    History of chickenpox    History of colon polyps    History of Helicobacter pylori infection    Hoarseness    Hypertension    Lung cancer ([place]) 05/2016   Chemo + rad tx's.    Migraines    OSA (obstructive sleep apnea)    has CPAP but does not use   Personal history of tobacco use, presenting hazards to health 03/05/2016   Pneumonia    5/17   Raynaud disease    Raynaud disease    Raynaud's disease    Rotator cuff tear    on right   Shortness of breath dyspnea    Sleep apnea    Ulcer (traumatic) of oral mucosa     Patient Active Problem List   Diagnosis Date Noted   Elevated hemoglobin A1c 06/19/2020   Hypothyroidism, acquired 05/30/2019   Squamous cell lung cancer, right ([place]) 05/30/2019   HFrEF (heart  failure with reduced ejection fraction) ([place]) 03/06/2019   Atherosclerosis 12/14/2018   Non-small cell lung cancer, right ([place]) 04/24/2018   Oral ulcer 02/16/2018   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) 09/11/2016   Sepsis ([place]) 09/09/2016   Cigarette smoker 06/11/2016   Cervical radiculopathy 04/15/2016   Cervical disc disorder at C5-C6 level with radiculopathy 03/09/2016   Impingement syndrome of right shoulder 03/09/2016   Personal history of tobacco use, presenting hazards to health 03/05/2016   Health care maintenance 09/29/2015   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   Benign essential hypertension 05/28/2015   Polycythemia 03/24/2015   CAD (coronary artery disease) 12/12/2014   Carotid artery disease ([place]) 12/12/2014   Disequilibrium 12/12/2014   Mixed hyperlipidemia 12/10/2014   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 headache (migraine equivalents) 08/12/2014   Incomplete emptying of bladder 06/04/2014   Anxiety 05/18/2014   Chronic coronary artery disease 05/18/2014   Chronic headaches 05/18/2014   Acute shoulder pain 03/15/2014   Impingement syndrome of left shoulder 03/15/2014   Lung mass 12/06/2013   Kidney stone 11/24/2013   COPD (chronic obstructive pulmonary disease) ([place])  04/24/2013   Tobacco use disorder 04/24/2013   Obstructive sleep apnea 04/24/2013   Benign localized prostatic hyperplasia with lower urinary tract symptoms (LUTS) 07/04/2012   Encounter for long-term current use of medication 07/04/2012   ED (erectile dysfunction) of organic origin 07/04/2012   Testicular hypofunction 07/04/2012    Past Surgical History:  Procedure Laterality Date   BACK SURGERY     cervical fusion x 2   CARDIAC CATHETERIZATION     CERVICAL DISCECTOMY     x 2   COLONOSCOPY     COLONOSCOPY N/A 07/25/2015    Procedure: COLONOSCOPY;  Surgeon: Lollie Sails, MD;  Location: [facility] ENDOSCOPY;  Service: Endoscopy;  Laterality: N/A;   COLONOSCOPY WITH PROPOFOL N/A 10/04/2017   Procedure: COLONOSCOPY WITH PROPOFOL;  Surgeon: Lollie Sails, MD;  Location[facility] ENDOSCOPY;  Service: Endoscopy;  Laterality: N/A;   COLONOSCOPY WITH PROPOFOL N/A 07/10/2020   Procedure: COLONOSCOPY WITH PROPOFOL;  Surgeon: Toledo, Benay Pike, MD;  Location: [facility] ENDOSCOPY;  Service: Gastroenterology;  Laterality: N/A;   ELECTROMAGNETIC NAVIGATION BROCHOSCOPY Left 06/28/2016   Procedure: ELECTROMAGNETIC NAVIGATION BRONCHOSCOPY;  Surgeon: Vilinda Boehringer, MD;  Location: [facility] ORS;  Service: Cardiopulmonary;  Laterality: Left;   ENDOBRONCHIAL ULTRASOUND N/A 04/11/2018   Procedure: ENDOBRONCHIAL ULTRASOUND;  Surgeon: [name], MD;  Location: [facility] ORS;  Service: Cardiopulmonary;  Laterality: N/A;   ESOPHAGOGASTRODUODENOSCOPY N/A 07/25/2015   Procedure: ESOPHAGOGASTRODUODENOSCOPY (EGD);  Surgeon: Lollie Sails, MD;  Location[facility] ENDOSCOPY;  Service: Endoscopy;  Laterality: N/A;   ESOPHAGOGASTRODUODENOSCOPY (EGD) WITH PROPOFOL N/A 07/10/2020   Procedure: ESOPHAGOGASTRODUODENOSCOPY (EGD) WITH PROPOFOL;  Surgeon: Toledo, Benay Pike, MD;  Location: [facility] ENDOSCOPY;  Service: Gastroenterology;  Laterality: N/A;   NASAL SINUS SURGERY     x 2    PORTA CATH INSERTION N/A 04/24/2018   Procedure: PORTA CATH INSERTION;  Surgeon: Algernon Huxley, MD;  Location: [place] [facility] CV LAB;  Service: Cardiovascular;  Laterality: N/A;   rotator cuff surgery Right    07/2016   SEPTOPLASTY     SKIN GRAFT      Prior to Admission medications   Medication Sig Start Date End Date Taking? Authorizing Provider  acetaminophen (TYLENOL) 500 MG tablet Take 500 mg by mouth daily.    [provider]  albuterol (PROAIR HFA) 108 (90 Base) MCG/ACT inhaler Inhale 2 puffs into the lungs every 4 (four) hours as needed for wheezing or shortness of breath.  08/23/18   Jacquelin Hawking, NP  ALPRAZolam Duanne Moron) 0.25 MG tablet Take 0.25 mg by mouth 2 (two) times daily as needed. 08/14/21   [provider]  Celedonio Miyamoto 62.5-25 MCG/INH AEPB INHALE 1 PUFF BY MOUTH EVERY DAY 12/22/20   [name], MD  atorvastatin (LIPITOR) 40 MG tablet Take 40 mg by mouth at bedtime.  03/30/13   [provider]  diazepam (VALIUM) 5 MG tablet Take 2 tablets (10 mg total) by mouth every 12 (twelve) hours as needed for anxiety. 09/11/19   [name], MD  DULoxetine (CYMBALTA) 60 MG capsule Take 1 capsule by mouth 1 day or 1 dose. 10/27/18   [provider]  fluticasone (FLONASE) 50 MCG/ACT nasal spray Place 1 spray into both nostrils daily as needed (for allergies.).     [provider]  Fluticasone-Umeclidin-Vilant (TRELEGY ELLIPTA) 200-62.5-25 MCG/INH AEPB Inhale 20 mcg into the lungs daily. 04/21/21   [name], MD  Fluticasone-Umeclidin-Vilant (TRELEGY ELLIPTA) 200-62.5-25 MCG/INH AEPB Inhale 200 mcg into the lungs daily. 04/21/21   [name],  MD  ipratropium-albuterol (DUONEB) 0.5-2.5 (3) MG/3ML SOLN Take 3 mLs by nebulization every 6 (six) hours. Patient taking differently: Take 3 mLs by nebulization as needed. 07/18/18   [name], MD  levothyroxine (SYNTHROID) 150 MCG tablet Take 150 mcg by mouth daily before breakfast.    [provider]  lidocaine (XYLOCAINE) 2 % solution  05/29/18   [provider]  lidocaine-prilocaine (EMLA) cream Apply 1 application topically as needed. 06/12/20   [name], MD  metoprolol succinate (TOPROL-XL) 25 MG 24 hr tablet Take 25 mg by mouth daily. 03/06/19   [provider]  Multiple Vitamin (MULTIVITAMIN WITH MINERALS) TABS tablet Take 1 tablet by mouth daily.     [provider]  MYRBETRIQ 50 MG TB24 tablet TAKE 1 TABLET BY MOUTH EVERY DAY 09/07/21   McGowan, Larene Beach A, PA-C  nitroGLYCERIN (NITROSTAT) 0.4 MG SL tablet Place 0.4 mg under the  tongue every 5 (five) minutes as needed for chest pain.  06/08/17 06/04/21  [provider]  ondansetron (ZOFRAN) 8 MG tablet Take 1 tablet (8 mg total) by mouth every 8 (eight) hours as needed for nausea or vomiting. 05/26/21   [name], MD  OXYGEN Inhale 2 L into the lungs at bedtime.    [provider]  pantoprazole (PROTONIX) 40 MG tablet Take 40 mg by mouth 2 (two) times daily. 01/05/21   [provider]  potassium chloride (K-DUR) 10 MEQ tablet TAKE 1 TABLET (10 MEQ TOTAL) BY MOUTH ONCE DAILY. 06/12/18   [provider]  propranolol (INNOPRAN XL) 80 MG 24 hr capsule Take by mouth. 04/27/15   [provider]  prochlorperazine (COMPAZINE) 10 MG tablet Take 1 tablet (10 mg total) by mouth every 6 (six) hours as needed (Nausea or vomiting). 04/28/18 08/23/18  [name], MD     Allergies Lisinopril and Varenicline  Family History  Problem Relation Age of Onset   Heart disease Father    Prostate cancer Father    Heart disease Paternal Grandmother    Heart attack Maternal Grandfather 68   Kidney cancer Neg Hx    Bladder Cancer Neg Hx    Other Neg Hx        pituitary abnormality    Social History Social History   Tobacco Use   Smoking status: Every Day    Packs/day: 3.00    Years: 45.00    Pack years: 135.00    Types: Cigarettes   Smokeless tobacco: Never   Tobacco comments:    2 PPD 04/21/2021  Vaping Use   Vaping Use: Never used  Substance Use Topics   Alcohol use: Yes    Alcohol/week: 2.0 standard drinks    Types: 2 Standard drinks or equivalent per week    Comment: moderate   Drug use: No    Review of Systems  Constitutional: As above Eyes: No visual changes.  ENT: No sore throat. Cardiovascular: Denies chest pain. Respiratory: Denies shortness of breath.  Occasional cough Gastrointestinal: No abdominal pain.   Genitourinary: Negative for dysuria. Musculoskeletal: Negative for back pain. Skin: Negative  for rash. Neurological: Negative for headaches or weakness   ____________________________________________   PHYSICAL EXAM:  VITAL SIGNS: ED Triage Vitals  Enc Vitals Group     BP 09/21/21 1039 125/87     Pulse Rate 09/21/21 1039 92     Resp 09/21/21 1039 20     Temp 09/21/21 1039 97.9 F (36.6 C)     Temp Source  09/21/21 1039 Oral     SpO2 09/21/21 1039 97 %     Weight 09/21/21 1040 88.5 kg (195 lb)     Height 09/21/21 1040 1.829 m (6')     Head Circumference --      Peak Flow --      Pain Score 09/21/21 1040 3     Pain Loc --      Pain Edu? --      Excl. in [place]? --     Constitutional: Alert and oriented.  Eyes: Conjunctivae are normal.   Nose: No congestion/rhinnorhea. Mouth/Throat: Mucous membranes are moist.    Cardiovascular: Normal rate, regular rhythm. Grossly normal heart sounds.  Good peripheral circulation. Respiratory: Normal respiratory effort.  No retractions. Lungs CTAB. Gastrointestinal: Soft and nontender. No distention.    Musculoskeletal: No lower extremity tenderness nor edema.  Warm and well perfused Neurologic:  Normal speech and language. No gross focal neurologic deficits are appreciated.  Skin:  Skin is warm, dry and intact. No rash noted. Psychiatric: Mood and affect are normal. Speech and behavior are normal.  ____________________________________________   LABS (all labs ordered are listed, but only abnormal results are displayed)  Labs Reviewed  CBC - Abnormal; Notable for the following components:      Result Value   RBC 3.39 (*)    Hemoglobin 11.0 (*)    HCT 31.6 (*)    All other components within normal limits  COMPREHENSIVE METABOLIC PANEL - Abnormal; Notable for the following components:   Sodium 120 (*)    Chloride 85 (*)    Creatinine, Ser 0.59 (*)    Calcium 8.6 (*)    Albumin 2.8 (*)    All other components within normal limits  RESP PANEL BY RT-PCR (FLU A&B, COVID) [id number]    ____________________________________________  EKG  ED ECG REPORT I, Lavonia Drafts, the attending physician, personally viewed and interpreted this ECG.  Date: 09/21/2021  Rhythm: normal sinus rhythm QRS Axis: normal Intervals: normal ST/T Wave abnormalities: normal Narrative Interpretation: no evidence of acute ischemia  ____________________________________________  RADIOLOGY  Chest x-ray reviewed by me, no evidence of pneumonia ____________________________________________   PROCEDURES  Procedure(s) performed: No  Procedures     ____________________________________________   INITIAL IMPRESSION / ASSESSMENT AND PLAN / ED COURSE  Pertinent labs & imaging results that were available during my care of the patient were reviewed by me and considered in my medical decision making (see chart for details).   Patient presents with generalized weakness which she reports is worsened over the last week, found to have hyponatremia at PCPs office.  Reviewed PCP notes, dropped from 1 24-1 22 there.  Here in the emergency department his sodium has dropped even further and is 120 currently.  Overall well-appearing and in no acute distress, will require admission for correction of hyponatremia    ____________________________________________   FINAL CLINICAL IMPRESSION(S) / ED DIAGNOSES  Final diagnoses:  Hyponatremia        Note:  This document was prepared using Dragon voice recognition software and may include unintentional dictation errors.    Lavonia Drafts, MD 09/21/21 1150

## 2021-09-21 NOTE — H&P (Signed)
History and Physical   Paul Carpenter FTD:[id number] DOB: [birth year] DOA: 09/21/2021  PCP: Kirk Ruths, MD  Outpatient Specialists: Dr. Grayland Ormond, medical oncology Patient coming from: [facility] clinic  I have personally briefly reviewed patient's old medical records in [place].  Chief Concern: Abnormal labs, hyponatremia  HPI: Paul Carpenter is a 66 y.o. male with medical history significant for left-sided non-small cell carcinoma, status post 5 rounds of radiation and completed chemotherapy in 2019 via right sided Port-A-Cath, hypertension, current tobacco use, COPD, hyperlipidemia, anxiety, depression, obstructive sleep apnea, with noncompliance of CPAP use, Raynaud's disease, who presents to the emergency department from [facility] clinic for chief concerns of hyponatremia.  He normally drinks coffee, tea, soda and rarely drinks water. He had had beer but does not have more than 4 alcoholic beverages per week.   He reports his weakness started about one month ago. He denies nausea, vomiting. He denies head trauma.  He reports drinking gatorade on Friday and salt tablets on Saturaday. He did not have any salt tablets on day of admission. He endorses persistent dizziness and weakness. He endorses his lungs feel 'raw' for about one week.   He denies fever, abdominal pain, dysuria, hematura, diarrhea. He endorses last BM was 09/20/21 and it was brown, good consistency and negative for black or visual blood.   He endorsed chest pain and chronic shortness of breath. He reports he has a scheduled stress test on Dec 2.   He endorses perceived weight lost of about 10 pounds in the last one month.  Social history: He lives at home with his wife. He currently smokes 3 ppd. He drinks etoh, about two mix drinks, 2x per weak. He denies recreational drug.   ROS: Constitutional: no weight change, no fever ENT/Mouth: no sore throat, no rhinorrhea Eyes: no eye pain, no vision  changes Cardiovascular: no chest pain, no dyspnea,  no edema, no palpitations Respiratory: no cough, no sputum, no wheezing Gastrointestinal: no nausea, no vomiting, no diarrhea, no constipation Genitourinary: no urinary incontinence, no dysuria, no hematuria Musculoskeletal: no arthralgias, no myalgias Skin: no skin lesions, no pruritus, Neuro: + weakness, no loss of consciousness, no syncope Psych: no anxiety, no depression, + decrease appetite Heme/Lymph: no bruising, no bleeding  ED Course: Discussed with emergency medicine provider, patient requiring hospitalization for chief concerns of hyponatremia.  Vitals in the emergency department was remarkable for temperature of 97.9, respiration rate of 20, heart rate of 92, blood pressure 125/87, SPO2 of 97% on room air.  Labs in the emergency department was remarkable for serum sodium 120, potassium 4.3, chloride 85, bicarb 28, BUN of 8, serum creatinine of 0.59, nonfasting glucose 91, GFR greater than 60, WBC 8.5, hemoglobin 11, platelets 326.  COVID/influenza A/influenza B PCR were negative.  In the emergency department ED provider administered 500 mL bolus of sodium chloride.  Assessment/Plan  Principal Problem:   Hyponatremia Active Problems:   COPD (chronic obstructive pulmonary disease) (HCC)   Tobacco use disorder   Obstructive sleep apnea   Thrombocytopenia (HCC)   Benign essential hypertension   CAD (coronary artery disease)   Mixed hyperlipidemia   Non-small cell lung cancer, right (HCC)   HFrEF (heart failure with reduced ejection fraction) (HCC)   Hypothyroidism, acquired   Squamous cell lung cancer, right ([place])   # Moderate hyponatremia with symptoms of weakness - In the emergency department his sodium level was 120, and it was 122 from a.m. labs at [place] [facility] on same  day - Serum sodium level was 133 on 07/01/2021 - Check uric acid level, serum osmolality, urine osmolality, urine sodium levels  # Left-sided  non-small cell carcinoma-stage IIIa, right middle lobe - Continue follow-up with outpatient oncologist - Dr. Grayland Ormond has been alerted of his admission by EDP  # Depression  # Anxiety - resumed home alprazolam 0.25 mg BID prn for anxiety   # History of hypertension  # Hyperlipidemia-home dose atorvastatin 40 mg nightly resumed # Heart failure reduced ejection fraction # Acquired hypothyroid-Home levothyroxine 150 mcg daily before breakfast resumed # Obstructive sleep apnea-CPAP nightly ordered # GERD - PPI # Tobacco use disorder-nicotine patch as needed for nicotine craving ordered - Patient endorses readiness to stop tobacco use. - Greater than 3 minutes spent for tobacco cessation counseling   Tobacco cessation counseling:  Week one, smoke 39 cigarettes per day. Week two, smoke 38 cigarettes per day. Week three, smoke 37 cigarettes per day, continue until smoking half the amount of cigarettes per day Discuss with PCP for pharmacologic assistance with smoking cessation Clean all indoor clothing, sheets, blankets, and freshen textile furniture to rid the smell of cigarettes Only smoke outside and wear outer covering Leave cigarettes and lighters outside in separate places During in-between cigarettes, if you feel the urge to smoke, use the following: stress squeezing devices/phone a trusted friend to talk you through the urge/walk in a safe environment Avoid prolong interactions with individuals actively smoking cigarettes If you smoke in social setting, avoid social setting where cigarette smoking is expected or considered acceptable  Call [facility] if in need of nicotine patches to help with cessation  Chart reviewed.   DVT prophylaxis: Enoxaparin 40 mg subcutaneous Code Status: full code   Diet: Heart healthy Family Communication: communicated with spouse at bedside  Disposition Plan: Pending clinical course Consults called: nephrology Admission status: Telemetry medical,  observation  Past Medical History:  Diagnosis Date   Anginal pain ([place])    Anxiety    Asthma    Chest pain    CHF (congestive heart failure) (HCC)    Chicken pox    Complication of anesthesia    o2 dropped after neck fusion   COPD (chronic obstructive pulmonary disease) ([place])    Coronary artery disease    Cough    chronic  clear phlegm   Dysrhythmia    palpitations   GERD (gastroesophageal reflux disease)    h/o reflux/ hoarsness   Hematochezia    Hemorrhoids    History of chickenpox    History of colon polyps    History of Helicobacter pylori infection    Hoarseness    Hypertension    Lung cancer ([place]) 05/2016   Chemo + rad tx's.    Migraines    OSA (obstructive sleep apnea)    has CPAP but does not use   Personal history of tobacco use, presenting hazards to health 03/05/2016   Pneumonia    5/17   Raynaud disease    Raynaud disease    Raynaud's disease    Rotator cuff tear    on right   Shortness of breath dyspnea    Sleep apnea    Ulcer (traumatic) of oral mucosa    Past Surgical History:  Procedure Laterality Date   BACK SURGERY     cervical fusion x 2   CARDIAC CATHETERIZATION     CERVICAL DISCECTOMY     x 2   COLONOSCOPY     COLONOSCOPY N/A 07/25/2015  Procedure: COLONOSCOPY;  Surgeon: Lollie Sails, MD;  Location[facility] ENDOSCOPY;  Service: Endoscopy;  Laterality: N/A;   COLONOSCOPY WITH PROPOFOL N/A 10/04/2017   Procedure: COLONOSCOPY WITH PROPOFOL;  Surgeon: Lollie Sails, MD;  Location: [facility] ENDOSCOPY;  Service: Endoscopy;  Laterality: N/A;   COLONOSCOPY WITH PROPOFOL N/A 07/10/2020   Procedure: COLONOSCOPY WITH PROPOFOL;  Surgeon: Toledo, Benay Pike, MD;  Location: [facility] ENDOSCOPY;  Service: Gastroenterology;  Laterality: N/A;   ELECTROMAGNETIC NAVIGATION BROCHOSCOPY Left 06/28/2016   Procedure: ELECTROMAGNETIC NAVIGATION BRONCHOSCOPY;  Surgeon: Vilinda Boehringer, MD;  Location: [facility] ORS;  Service: Cardiopulmonary;  Laterality: Left;   ENDOBRONCHIAL  ULTRASOUND N/A 04/11/2018   Procedure: ENDOBRONCHIAL ULTRASOUND;  Surgeon: [name], MD;  Location: [facility] ORS;  Service: Cardiopulmonary;  Laterality: N/A;   ESOPHAGOGASTRODUODENOSCOPY N/A 07/25/2015   Procedure: ESOPHAGOGASTRODUODENOSCOPY (EGD);  Surgeon: Lollie Sails, MD;  Location: [facility] ENDOSCOPY;  Service: Endoscopy;  Laterality: N/A;   ESOPHAGOGASTRODUODENOSCOPY (EGD) WITH PROPOFOL N/A 07/10/2020   Procedure: ESOPHAGOGASTRODUODENOSCOPY (EGD) WITH PROPOFOL;  Surgeon: Toledo, Benay Pike, MD;  Location: [facility] ENDOSCOPY;  Service: Gastroenterology;  Laterality: N/A;   NASAL SINUS SURGERY     x 2    PORTA CATH INSERTION N/A 04/24/2018   Procedure: PORTA CATH INSERTION;  Surgeon: Algernon Huxley, MD;  Location: [facility];  Service: Cardiovascular;  Laterality: N/A;   rotator cuff surgery Right    07/2016   SEPTOPLASTY     SKIN GRAFT     Social History:  reports that he has been smoking cigarettes. He has a 135.00 pack-year smoking history. He has never used smokeless tobacco. He reports current alcohol use of about 2.0 standard drinks per week. He reports that he does not use drugs.  Allergies  Allergen Reactions   Lisinopril Rash   Varenicline Rash   Family History  Problem Relation Age of Onset   Heart disease Father    Prostate cancer Father    Heart disease Paternal Grandmother    Heart attack Maternal Grandfather 16   Kidney cancer Neg Hx    Bladder Cancer Neg Hx    Other Neg Hx        pituitary abnormality   Family history: Family history reviewed and not pertinent  Prior to Admission medications   Medication Sig Start Date End Date Taking? Authorizing Provider  acetaminophen (TYLENOL) 500 MG tablet Take 500 mg by mouth daily.    [provider]  albuterol (PROAIR HFA) 108 (90 Base) MCG/ACT inhaler Inhale 2 puffs into the lungs every 4 (four) hours as needed for wheezing or shortness of breath. 08/23/18   Jacquelin Hawking, NP  ALPRAZolam Duanne Moron) 0.25 MG  tablet Take 0.25 mg by mouth 2 (two) times daily as needed. 08/14/21   [provider]  Celedonio Miyamoto 62.5-25 MCG/INH AEPB INHALE 1 PUFF BY MOUTH EVERY DAY 12/22/20   [name], MD  atorvastatin (LIPITOR) 40 MG tablet Take 40 mg by mouth at bedtime.  03/30/13   [provider]  diazepam (VALIUM) 5 MG tablet Take 2 tablets (10 mg total) by mouth every 12 (twelve) hours as needed for anxiety. 09/11/19   [name], MD  DULoxetine (CYMBALTA) 60 MG capsule Take 1 capsule by mouth 1 day or 1 dose. 10/27/18   [provider]  fluticasone (FLONASE) 50 MCG/ACT nasal spray Place 1 spray into both nostrils daily as needed (for allergies.).     [provider]  Fluticasone-Umeclidin-Vilant (TRELEGY ELLIPTA) 200-62.5-25 MCG/INH AEPB Inhale 20  mcg into the lungs daily. 04/21/21   [name], MD  Fluticasone-Umeclidin-Vilant (TRELEGY ELLIPTA) 200-62.5-25 MCG/INH AEPB Inhale 200 mcg into the lungs daily. 04/21/21   [name], MD  ipratropium-albuterol (DUONEB) 0.5-2.5 (3) MG/3ML SOLN Take 3 mLs by nebulization every 6 (six) hours. Patient taking differently: Take 3 mLs by nebulization as needed. 07/18/18   [name], MD  levothyroxine (SYNTHROID) 150 MCG tablet Take 150 mcg by mouth daily before breakfast.    [provider]  lidocaine (XYLOCAINE) 2 % solution  05/29/18   [provider]  lidocaine-prilocaine (EMLA) cream Apply 1 application topically as needed. 06/12/20   [name], MD  metoprolol succinate (TOPROL-XL) 25 MG 24 hr tablet Take 25 mg by mouth daily. 03/06/19   [provider]  Multiple Vitamin (MULTIVITAMIN WITH MINERALS) TABS tablet Take 1 tablet by mouth daily.     [provider]  MYRBETRIQ 50 MG TB24 tablet TAKE 1 TABLET BY MOUTH EVERY DAY 09/07/21   McGowan, Larene Beach A, PA-C  nitroGLYCERIN (NITROSTAT) 0.4 MG SL tablet Place 0.4 mg under the tongue every 5 (five) minutes as needed for chest pain.  06/08/17  06/04/21  [provider]  ondansetron (ZOFRAN) 8 MG tablet Take 1 tablet (8 mg total) by mouth every 8 (eight) hours as needed for nausea or vomiting. 05/26/21   [name], MD  OXYGEN Inhale 2 L into the lungs at bedtime.    [provider]  pantoprazole (PROTONIX) 40 MG tablet Take 40 mg by mouth 2 (two) times daily. 01/05/21   [provider]  potassium chloride (K-DUR) 10 MEQ tablet TAKE 1 TABLET (10 MEQ TOTAL) BY MOUTH ONCE DAILY. 06/12/18   [provider]  propranolol (INNOPRAN XL) 80 MG 24 hr capsule Take by mouth. 04/27/15   [provider]  prochlorperazine (COMPAZINE) 10 MG tablet Take 1 tablet (10 mg total) by mouth every 6 (six) hours as needed (Nausea or vomiting). 04/28/18 08/23/18  [name], MD   Physical Exam: Vitals:   09/21/21 1130 09/21/21 1200 09/21/21 1230 09/21/21 1300  BP: 126/83 131/77 132/77 132/78  Pulse: 83 82 78 76  Resp: 19 16 (!) 24 (!) 22  Temp:      TempSrc:      SpO2: 96% 96% 96% 94%  Weight:      Height:       Constitutional: appears age appropriate, frail, NAD, calm, comfortable Eyes: PERRL, lids and conjunctivae normal ENMT: Mucous membranes are moist. Posterior pharynx clear of any exudate or lesions. Age-appropriate dentition. Hearing appropriate Neck: normal, supple, no masses, no thyromegaly Respiratory: clear to auscultation bilaterally, no wheezing, no crackles. Normal respiratory effort. No accessory muscle use.  Cardiovascular: Regular rate and rhythm, no murmurs / rubs / gallops. No extremity edema. 2+ pedal pulses. No carotid bruits.  Abdomen: no tenderness, no masses palpated, no hepatosplenomegaly. Bowel sounds positive.  Musculoskeletal: no clubbing / cyanosis. No joint deformity upper and lower extremities. Good ROM, no contractures, no atrophy. Normal muscle tone.  Skin: no rashes, lesions, ulcers. No induration Neurologic: Sensation intact. Strength 5/5 in all 4.  Psychiatric:  Normal judgment and insight. Alert and oriented x 3. Normal mood.   EKG: independently reviewed, showing sinus rhythm with rate of 92, QTc 413  Chest x-ray on Admission: I personally reviewed and I agree with radiologist reading as below.  DG Chest Port 1 View  Result Date: 09/21/2021 CLINICAL DATA:  Weakness. History of lung cancer. Last radiation  therapy a few weeks ago. EXAM: PORTABLE CHEST 1 VIEW COMPARISON:  Chest radiograph of 10/09/2019.  Chest CT 07/20/2021 FINDINGS: Lower cervical spine fixation. Right Port-A-Cath tip at low SVC. Numerous leads and wires project over the chest. Midline trachea. Normal heart size. Atherosclerosis in the transverse aorta. No pleural effusion or pneumothorax. Right mid and upper lung interstitial and airspace disease with underlying right apical and upper lobe cavitary lesion. Clear left lung. IMPRESSION: Right upper and mid lung interstitial and airspace disease, which given the clinical history is most likely related to evolving radiation change. Concurrent pneumonia cannot be excluded. No left-sided airspace disease. Aortic Atherosclerosis (ICD10-I70.0). Electronically Signed   By: Abigail Miyamoto M.D.   On: 09/21/2021 12:18    Labs on Admission: I have personally reviewed following labs  CBC: Recent Labs  Lab 09/21/21 1117  WBC 8.5  HGB 11.0*  HCT 31.6*  MCV 93.2  PLT 732   Basic Metabolic Panel: Recent Labs  Lab 09/21/21 1117  NA 120*  K 4.3  CL 85*  CO2 28  GLUCOSE 91  BUN 8  CREATININE 0.59*  CALCIUM 8.6*  MG 1.4*  PHOS 3.6   GFR: Estimated Creatinine Clearance: 102.4 mL/min (A) (by C-G formula based on SCr of 0.59 mg/dL (L)).  Liver Function Tests: Recent Labs  Lab 09/21/21 1117  AST 31  ALT 37  ALKPHOS 91  BILITOT 0.4  PROT 6.5  ALBUMIN 2.8*    Urine analysis:    Component Value Date/Time   COLORURINE YELLOW (A) 07/19/2018 1630   APPEARANCEUR Clear 08/15/2020 0951   LABSPEC 1.010 07/19/2018 1630   LABSPEC 1.004  01/26/2012 0944   PHURINE 6.0 07/19/2018 1630   GLUCOSEU Negative 08/15/2020 0951   GLUCOSEU Negative 01/26/2012 0944   HGBUR NEGATIVE 07/19/2018 1630   BILIRUBINUR Negative 08/15/2020 0951   BILIRUBINUR Negative 01/26/2012 0944   KETONESUR NEGATIVE 07/19/2018 1630   PROTEINUR Negative 08/15/2020 0951   PROTEINUR NEGATIVE 07/19/2018 1630   NITRITE Negative 08/15/2020 0951   NITRITE NEGATIVE 07/19/2018 1630   LEUKOCYTESUR Negative 08/15/2020 0951   LEUKOCYTESUR Negative 01/26/2012 0944   Dr. Tobie Poet [facility]  If 7PM-7AM, please contact overnight-coverage provider If 7AM-7PM, please contact day coverage provider [email or web address]  09/21/2021, 1:52 PM

## 2021-09-21 NOTE — ED Triage Notes (Signed)
Pt sent from [facility] for Na+ 122, pt is currently getting radiations for lung CA, last chemo was a year ago. Pt c/o generalized weakness. Pt is a/ox4, ambulatory with a steady gait.

## 2021-09-21 NOTE — Telephone Encounter (Signed)
Patient's wife called to report that patient is currently in the Emergency room he saw his PCP las Friday and was advised to go the hospital secondary to abnormal lab values.

## 2021-09-22 DIAGNOSIS — G4733 Obstructive sleep apnea (adult) (pediatric): Secondary | ICD-10-CM | POA: Diagnosis not present

## 2021-09-22 DIAGNOSIS — E871 Hypo-osmolality and hyponatremia: Secondary | ICD-10-CM | POA: Diagnosis not present

## 2021-09-22 DIAGNOSIS — E663 Overweight: Secondary | ICD-10-CM | POA: Diagnosis present

## 2021-09-22 DIAGNOSIS — I10 Essential (primary) hypertension: Secondary | ICD-10-CM

## 2021-09-22 DIAGNOSIS — E222 Syndrome of inappropriate secretion of antidiuretic hormone: Secondary | ICD-10-CM | POA: Diagnosis not present

## 2021-09-22 LAB — CBC
HCT: 33.3 % — ABNORMAL LOW (ref 39.0–52.0)
Hemoglobin: 11.6 g/dL — ABNORMAL LOW (ref 13.0–17.0)
MCH: 32.1 pg (ref 26.0–34.0)
MCHC: 34.8 g/dL (ref 30.0–36.0)
MCV: 92.2 fL (ref 80.0–100.0)
Platelets: 335 10*3/uL (ref 150–400)
RBC: 3.61 MIL/uL — ABNORMAL LOW (ref 4.22–5.81)
RDW: 11.9 % (ref 11.5–15.5)
WBC: 7.6 10*3/uL (ref 4.0–10.5)
nRBC: 0 % (ref 0.0–0.2)

## 2021-09-22 LAB — BASIC METABOLIC PANEL
Anion gap: 7 (ref 5–15)
BUN: 8 mg/dL (ref 8–23)
CO2: 28 mmol/L (ref 22–32)
Calcium: 8.9 mg/dL (ref 8.9–10.3)
Chloride: 92 mmol/L — ABNORMAL LOW (ref 98–111)
Creatinine, Ser: 0.65 mg/dL (ref 0.61–1.24)
GFR, Estimated: >60 mL/min (ref >60)
Glucose, Bld: 98 mg/dL (ref 70–99)
Potassium: 4.9 mmol/L (ref 3.5–5.1)
Sodium: 127 mmol/L — ABNORMAL LOW (ref 135–145)

## 2021-09-22 LAB — SODIUM: Sodium: 127 mmol/L — ABNORMAL LOW (ref 135–145)

## 2021-09-22 MED ORDER — TOLVAPTAN 15 MG PO TABS
15.0000 mg | ORAL_TABLET | Freq: Once | ORAL | Status: DC
  Filled 2021-09-22: qty 1

## 2021-09-22 MED ORDER — TORSEMIDE 10 MG PO TABS: 10.0000 mg | ORAL_TABLET | Freq: Every day | ORAL | 1 refills | Status: DC

## 2021-09-22 MED ORDER — SODIUM CHLORIDE 1 G PO TABS: 1.0000 g | ORAL_TABLET | Freq: Three times a day (TID) | ORAL | 1 refills | Status: DC

## 2021-09-22 NOTE — Progress Notes (Signed)
Sodium 128 from 120. Pt expresses frustration that he "did not get much sleep because he was up to urinate so frequently." NAD this shift. Encouraged to call with any needs.

## 2021-09-22 NOTE — Discharge Summary (Signed)
Discharge Summary  Paul Carpenter IDP:[id number] DOB: 01-09-1956  PCP: Kirk Ruths, MD  Admit date: 09/21/2021 Discharge date: 09/22/2021  Time spent: 25 minutes  Recommendations for Outpatient Follow-up:  New medication: Torsemide 10 mg p.o. daily Medication change: Sodium chloride tablets increased from twice a day to 3 times a day  Discharge Diagnoses:  Active Hospital Problems   Diagnosis Date Noted   Hyponatremia 09/11/2016   Hypomagnesemia 09/21/2021   Squamous cell lung cancer, right ([place]) 05/30/2019   Hypothyroidism, acquired 05/30/2019   HFrEF (heart failure with reduced ejection fraction) ([place]) 03/06/2019   Non-small cell lung cancer, right ([place]) 04/24/2018   Thrombocytopenia ([place]) 09/11/2016   Benign essential hypertension 05/28/2015   CAD (coronary artery disease) 12/12/2014   Mixed hyperlipidemia 12/10/2014   Tobacco use disorder 04/24/2013   Obstructive sleep apnea 04/24/2013   COPD (chronic obstructive pulmonary disease) ([place]) 04/24/2013    Resolved Hospital Problems  No resolved problems to display.    Discharge Condition: Improved, being discharged home  Diet recommendation: Low-sodium with fluid restriction  Vitals:   09/22/21 0738 09/22/21 1140  BP: 115/75 126/77  Pulse: 80 87  Resp: 16 18  Temp: 97.9 F (36.6 C) 98.3 F (36.8 C)  SpO2: 97% 98%    History of present illness:  Patient is a 65 year old male with past medical history significant for left lung non-small cell carcinoma status post radiation and chemo along with ongoing tobacco abuse, COPD with noncompliance with oxygen, sleep apnea with noncompliance with CPAP and depression who was sent over from [facility] for chief concerns of hyponatremia on lab work.  Patient states he normally takes salt tablets, but ran out a few days ago.  He reports weakness for the past month.  In the emergency room, sodium noted to be at 120.  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  Nephrology consulted.  Patient given dose of Samsca and over the next 10 hours, sodium improved to 128.  Follow-up sodium this morning down to 127 with repeat 6 hours later still at 127.  Nephrology had seen the patient and plan to give him another dose of Samsca, but the patient declined this saying that he wanted to leave and did not want to stay longer for further evaluation and monitoring.  Patient says he will follow-up with his PCP.  Prescription given for increasing his sodium tablets twice a day to 3 times a day and adding low-dose torsemide which should help potentiate that further. Active Problems:   COPD (chronic obstructive pulmonary disease) ([place]) with chronic respiratory failure.  Patient is about to be on oxygen at home, but he does not use this.    Tobacco use disorder: Counseled to quit.  Patient has ongoing tobacco abuse.  Nicotine patch while he was here in the hospital.    Obstructive sleep apnea: Encouraged to use CPAP machine    Thrombocytopenia (H[facility])   Benign essential hypertension: Blood pressure stable.    CAD (coronary artery disease)   Mixed hyperlipidemia   Non-small cell lung cancer, right (HCC)   HFrEF (heart failure with reduced ejection fraction) ([place]): Euvolemic.    Hypothyroidism, acquired: Continue Synthroid.    Squamous cell lung cancer, right (HCC)   Hypomagnesemia: Replaced on admission  Overweight: Patient meets criteria BMI greater than 25  Procedures: None  Consultations: Nephrology  Discharge Exam: BP 126/77 (BP Location: Right Arm)   Pulse 87   Temp 98.3 F (36.8  C) (Oral)   Resp 18   Ht 6' (1.829 m)   Wt 88.5 kg   SpO2 98%   BMI 26.45 kg/m   General: Alert and oriented x3, no acute distress Cardiovascular: Regular rate and rhythm, S1-S2, 2 out of 6 stock ejection murmur Respiratory: Decreased breath sounds throughout  Discharge Instructions You were cared for by a hospitalist during  your hospital stay. If you have any questions about your discharge medications or the care you received while you were in the hospital after you are discharged, you can call the unit and asked to speak with the hospitalist on call if the hospitalist that took care of you is not available. Once you are discharged, your primary care physician will handle any further medical issues. Please note that NO REFILLS for any discharge medications will be authorized once you are discharged, as it is imperative that you return to your primary care physician (or establish a relationship with a primary care physician if you do not have one) for your aftercare needs so that they can reassess your need for medications and monitor your lab values.  Discharge Instructions     Diet - low sodium heart healthy   Complete by: As directed    Increase activity slowly   Complete by: As directed       Allergies as of 09/22/2021       Reactions   Lisinopril Rash   Varenicline Rash        Medication List     TAKE these medications    acetaminophen 500 MG tablet Commonly known as: TYLENOL Take 500 mg by mouth daily.   albuterol 108 (90 Base) MCG/ACT inhaler Commonly known as: ProAir HFA Inhale 2 puffs into the lungs every 4 (four) hours as needed for wheezing or shortness of breath.   ALPRAZolam 0.25 MG tablet Commonly known as: XANAX Take 0.25 mg by mouth 2 (two) times daily as needed.   atorvastatin 40 MG tablet Commonly known as: LIPITOR Take 40 mg by mouth at bedtime.   doxycycline 100 MG capsule Commonly known as: VIBRAMYCIN Take 100 mg by mouth 2 (two) times daily.   DULoxetine 60 MG capsule Commonly known as: CYMBALTA Take 60 mg by mouth daily.   ipratropium-albuterol 0.5-2.5 (3) MG/3ML Soln Commonly known as: DUONEB Take 3 mLs by nebulization every 6 (six) hours. What changed:  when to take this reasons to take this   levothyroxine 150 MCG tablet Commonly known as: SYNTHROID Take  150 mcg by mouth daily before breakfast.   lidocaine-prilocaine cream Commonly known as: EMLA Apply 1 application topically as needed.   metoprolol succinate 25 MG 24 hr tablet Commonly known as: TOPROL-XL Take 25 mg by mouth daily.   multivitamin with minerals Tabs tablet Take 1 tablet by mouth daily.   nitroGLYCERIN 0.4 MG SL tablet Commonly known as: NITROSTAT Place 0.4 mg under the tongue every 5 (five) minutes as needed for chest pain.   ondansetron 8 MG tablet Commonly known as: ZOFRAN Take 1 tablet (8 mg total) by mouth every 8 (eight) hours as needed for nausea or vomiting.   OXYGEN Inhale 2 L into the lungs at bedtime.   pantoprazole 40 MG tablet Commonly known as: PROTONIX Take 40 mg by mouth 2 (two) times daily.   potassium chloride 10 MEQ tablet Commonly known as: KLOR-CON Take 10 mEq by mouth daily.   predniSONE 20 MG tablet Commonly known as: DELTASONE Take 40 mg by mouth daily.  sodium chloride 1 g tablet Take 1 tablet (1 g total) by mouth 3 (three) times daily with meals. What changed: when to take this   torsemide 10 MG tablet Commonly known as: DEMADEX Take 1 tablet (10 mg total) by mouth daily.   Trelegy Ellipta 200-62.5-25 MCG/ACT Aepb Generic drug: Fluticasone-Umeclidin-Vilant Inhale 200 mcg into the lungs daily.       Allergies  Allergen Reactions   Lisinopril Rash   Varenicline Rash      The results of significant diagnostics from this hospitalization (including imaging, microbiology, ancillary and laboratory) are listed below for reference.    Significant Diagnostic Studies: DG Chest Port 1 View  Result Date: 09/21/2021 CLINICAL DATA:  Weakness. History of lung cancer. Last radiation therapy a few weeks ago. EXAM: PORTABLE CHEST 1 VIEW COMPARISON:  Chest radiograph of 10/09/2019.  Chest CT 07/20/2021 FINDINGS: Lower cervical spine fixation. Right Port-A-Cath tip at low SVC. Numerous leads and wires project over the chest.  Midline trachea. Normal heart size. Atherosclerosis in the transverse aorta. No pleural effusion or pneumothorax. Right mid and upper lung interstitial and airspace disease with underlying right apical and upper lobe cavitary lesion. Clear left lung. IMPRESSION: Right upper and mid lung interstitial and airspace disease, which given the clinical history is most likely related to evolving radiation change. Concurrent pneumonia cannot be excluded. No left-sided airspace disease. Aortic Atherosclerosis (ICD10-I70.0). Electronically Signed   By: Abigail Miyamoto M.D.   On: 09/21/2021 12:18    Microbiology: Recent Results (from the past 240 hour(s))  Resp Panel by RT-PCR (Flu A&B, Covid) Nasopharyngeal Swab     Status: None   Collection Time: 09/21/21 11:17 AM   Specimen: Nasopharyngeal Swab; Nasopharyngeal(NP) swabs in vial transport medium  Result Value Ref Range Status   SARS Coronavirus 2 by RT PCR NEGATIVE NEGATIVE Final    Comment: (NOTE) SARS-CoV-2 target nucleic acids are NOT DETECTED.  The SARS-CoV-2 RNA is generally detectable in upper respiratory specimens during the acute phase of infection. The lowest concentration of SARS-CoV-2 viral copies this assay can detect is 138 copies/mL. A negative result does not preclude SARS-Cov-2 infection and should not be used as the sole basis for treatment or other patient management decisions. A negative result may occur with  improper specimen collection/handling, submission of specimen other than nasopharyngeal swab, presence of viral mutation(s) within the areas targeted by this assay, and inadequate number of viral copies(<138 copies/mL). A negative result must be combined with clinical observations, patient history, and epidemiological information. The expected result is Negative.  Fact Sheet for Patients:  EntrepreneurPulse.com.au  Fact Sheet for Healthcare Providers:  IncredibleEmployment.be  This test is  no t yet approved or cleared by the [place] FDA and  has been authorized for detection and/or diagnosis of SARS-CoV-2 by FDA under an Emergency Use Authorization (EUA). This EUA will remain  in effect (meaning this test can be used) for the duration of the COVID-19 declaration under Section 564(b)(1) of the Act, 21 U.S.C.section 360bbb-3(b)(1), unless the authorization is terminated  or revoked sooner.       Influenza A by PCR NEGATIVE NEGATIVE Final   Influenza B by PCR NEGATIVE NEGATIVE Final    Comment: (NOTE) The Xpert Xpress SARS-CoV-2/FLU/RSV plus assay is intended as an aid in the diagnosis of influenza from Nasopharyngeal swab specimens and should not be used as a sole basis for treatment. Nasal washings and aspirates are unacceptable for Xpert Xpress SARS-CoV-2/FLU/RSV testing.  Fact Sheet for Patients: EntrepreneurPulse.com.au  Fact Sheet for Healthcare Providers: IncredibleEmployment.be  This test is not yet approved or cleared by the [place] FDA and has been authorized for detection and/or diagnosis of SARS-CoV-2 by FDA under an Emergency Use Authorization (EUA). This EUA will remain in effect (meaning this test can be used) for the duration of the COVID-19 declaration under Section 564(b)(1) of the Act, 21 U.S.C. section 360bbb-3(b)(1), unless the authorization is terminated or revoked.  Performed at [facility], [address] [place], [place] [address]      Labs: Basic Metabolic Panel: Recent Labs  Lab 09/21/21 1117 09/21/21 2253 09/22/21 0711 09/22/21 1233  NA 120* 128* 127* 127*  K 4.3  --  4.9  --   CL 85*  --  92*  --   CO2 28  --  28  --   GLUCOSE 91  --  98  --   BUN 8  --  8  --   CREATININE 0.59*  --  0.65  --   CALCIUM 8.6*  --  8.9  --   MG 1.4*  --   --   --   PHOS 3.6  --   --   --    Liver Function Tests: Recent Labs  Lab 09/21/21 1117  AST 31  ALT 37  ALKPHOS 91   BILITOT 0.4  PROT 6.5  ALBUMIN 2.8*   No results for input(s): LIPASE, AMYLASE in the last 168 hours. No results for input(s): AMMONIA in the last 168 hours. CBC: Recent Labs  Lab 09/21/21 1117 09/22/21 0711  WBC 8.5 7.6  HGB 11.0* 11.6*  HCT 31.6* 33.3*  MCV 93.2 92.2  PLT 326 335   Cardiac Enzymes: No results for input(s): CKTOTAL, CKMB, CKMBINDEX, TROPONINI in the last 168 hours. BNP: BNP (last 3 results) No results for input(s): BNP in the last 8760 hours.  ProBNP (last 3 results) No results for input(s): PROBNP in the last 8760 hours.  CBG: No results for input(s): GLUCAP in the last 168 hours.     Signed:  Annita Brod, MD [facility] 09/22/2021, 2:55 PM

## 2021-09-22 NOTE — Progress Notes (Signed)
Nutrition Brief Note  Patient identified on the Malnutrition Screening Tool (MST) Report  Wt Readings from Last 15 Encounters:  09/21/21 88.5 kg  08/18/21 88 kg  07/30/21 89 kg  05/26/21 89.2 kg  05/01/21 91 kg  04/21/21 92.1 kg  01/26/21 94.2 kg  11/20/20 94.3 kg  09/22/20 92.3 kg  08/20/20 95.3 kg  08/20/20 95.3 kg  08/15/20 93 kg  07/10/20 90.7 kg  06/12/20 93 kg  05/19/20 91.2 kg   Paul Carpenter is a 65 y.o. male with medical history significant for left-sided non-small cell carcinoma, status post 5 rounds of radiation and completed chemotherapy in 2019 via right sided Port-A-Cath, hypertension, current tobacco use, COPD, hyperlipidemia, anxiety, depression, obstructive sleep apnea, with noncompliance of CPAP use, Raynaud's disease, who presents to the emergency department from [facility] clinic for chief concerns of hyponatremia.  Pt admitted with moderate hyponatremia with symptoms of weakness.   Reviewed I/O's: +565 ml x 24 hours  Spoke with pt at bedside, who reports intake is improving. He consumed 100% of his lunch. He shares if he does not eat the hospital food, he will consume snacks of fast food that his visitors have brought for him.   He complains of being weak PTA and "shuffling around". He consumes 3 meals per day (light breakfast and lunch and a heavy dinner).     Pt reports some weight loss over the past month. He reports his UBW is 160#. Per wt hx, wt has been stable over the past month.   Nutrition-Focused physical exam completed. Findings are no fat depletion, no muscle depletion, and no edema.    Per MD, plan to discharge home today.   Labs reviewed: Na: 127.    Current diet order is Heart Healthy, patient is consuming approximately 50-100% of meals at this time. Labs and medications reviewed.   No nutrition interventions warranted at this time. If nutrition issues arise, please consult RD.   Loistine Chance, RD, LDN, [place] Registered Dietitian  II Certified Diabetes Care and Education Specialist Please refer to [facility] for RD and/or RD on-call/weekend/after hours pager

## 2021-09-22 NOTE — Progress Notes (Signed)
   09/22/21 1518  AVS Discharge Documentation  AVS Discharge Instructions Including Medications Provided to patient/caregiver  Name of Person Receiving AVS Discharge Instructions Including Medications Edmonia Lynch  Name of Clinician That Reviewed AVS Discharge Instructions Including Medications Ria Bush RN

## 2021-09-22 NOTE — Progress Notes (Signed)
[facility] NOTE  Pharmacy Consult for Tolvaptan Indication: hyponatremia  Allergies  Allergen Reactions   Lisinopril Rash   Varenicline Rash    Patient Measurements: Height: 6' (182.9 cm) Weight: 88.5 kg (195 lb) IBW/kg (Calculated) : 77.6  Intake/Output from previous day: 11/21 0701 - 11/22 0700 In: 325 [P.O.:240; I.V.:35; IV Piggyback:50] Out: -  Intake/Output from this shift: Total I/O In: 240 [P.O.:240] Out: -   Labs: Recent Labs    09/21/21 1117  WBC 8.5  HGB 11.0*  HCT 31.6*  PLT 326  CREATININE 0.59*  MG 1.4*  PHOS 3.6  ALBUMIN 2.8*  PROT 6.5  AST 31  ALT 37  ALKPHOS 91  BILITOT 0.4    Estimated Creatinine Clearance: 102.4 mL/min (A) (by C-G formula based on SCr of 0.59 mg/dL (L)).  Medical History: Past Medical History:  Diagnosis Date   Anginal pain ([place])    Anxiety    Asthma    Chest pain    CHF (congestive heart failure) (HCC)    Chicken pox    Complication of anesthesia    o2 dropped after neck fusion   COPD (chronic obstructive pulmonary disease) (H[facility])    Coronary artery disease    Cough    chronic  clear phlegm   Dysrhythmia    palpitations   GERD (gastroesophageal reflux disease)    h/o reflux/ hoarsness   Hematochezia    Hemorrhoids    History of chickenpox    History of colon polyps    History of Helicobacter pylori infection    Hoarseness    Hypertension    Lung cancer ([place]) 05/2016   Chemo + rad tx's.    Migraines    OSA (obstructive sleep apnea)    has CPAP but does not use   Personal history of tobacco use, presenting hazards to health 03/05/2016   Pneumonia    5/17   Raynaud disease    Raynaud disease    Raynaud's disease    Rotator cuff tear    on right   Shortness of breath dyspnea    Sleep apnea    Ulcer (traumatic) of oral mucosa     Assessment: 65 y.o. male with medical history significant for left-sided non-small cell carcinoma, status post 5 rounds of radiation and completed  chemotherapy in 2019 via right sided Port-A-Cath, hypertension, current tobacco use, COPD, hyperlipidemia, anxiety, depression, obstructive sleep apnea, with noncompliance of CPAP use, Raynaud's disease, who presents to the emergency department from [facility] clinic for chief concerns of hyponatremia. Pharmacy has been consulted for tolvaptan monitoring.   Goal of Therapy:  Na WNL (pt usual sodium ranges between 127-133)  Plan:  Tolvaptan 15 mg PO x 1 dose Monitor Na every 8 hours for 24 hours. Hold tolvaptan and notify MD if sodium increases more than 8 mEq/L in 8 hours OR greater than 12 mEq/L in 24 hours.   11/21:  Na @ 2253 = 128 Will recheck Na in 8 hrs on 11/22 @ 0700.   Wynetta Seith D 09/22/2021,2:23 AM

## 2021-09-22 NOTE — Progress Notes (Addendum)
[facility]  ROUNDING NOTE   Subjective:   Patient seen resting in bed Alert and ordered Tolerating meals without nausea and vomiting Denies shortness of breath States he was up urinating all night  Sodium 127 today  Objective:  Vital signs in last 24 hours:  Temp:  [97.9 F (36.6 C)-98.9 F (37.2 C)] 98.3 F (36.8 C) (11/22 1140) Pulse Rate:  [76-88] 87 (11/22 1140) Resp:  [16-24] 18 (11/22 1140) BP: (115-137)/(71-85) 126/77 (11/22 1140) SpO2:  [94 %-99 %] 98 % (11/22 1140)  Weight change:  Filed Weights   09/21/21 1040  Weight: 88.5 kg    Intake/Output: I/O last 3 completed shifts: In: 50 [P.O.:480; I.V.:35; IV Piggyback:50] Out: -    Intake/Output this shift:  Total I/O In: 240 [P.O.:240] Out: -   Physical Exam: General: NAD, resting in bed  Head: Normocephalic, atraumatic. Moist oral mucosal membranes  Eyes: Anicteric  Lungs:  Clear to auscultation, normal effort  Heart: Regular rate and rhythm  Abdomen:  Soft, nontender, nondistended  Extremities: No peripheral edema.  Neurologic: Nonfocal, moving all four extremities  Skin: No lesions       Basic Metabolic Panel: Recent Labs  Lab 09/21/21 1117 09/21/21 2253 09/22/21 0711  NA 120* 128* 127*  K 4.3  --  4.9  CL 85*  --  92*  CO2 28  --  28  GLUCOSE 91  --  98  BUN 8  --  8  CREATININE 0.59*  --  0.65  CALCIUM 8.6*  --  8.9  MG 1.4*  --   --   PHOS 3.6  --   --     Liver Function Tests: Recent Labs  Lab 09/21/21 1117  AST 31  ALT 37  ALKPHOS 91  BILITOT 0.4  PROT 6.5  ALBUMIN 2.8*   No results for input(s): LIPASE, AMYLASE in the last 168 hours. No results for input(s): AMMONIA in the last 168 hours.  CBC: Recent Labs  Lab 09/21/21 1117 09/22/21 0711  WBC 8.5 7.6  HGB 11.0* 11.6*  HCT 31.6* 33.3*  MCV 93.2 92.2  PLT 326 335    Cardiac Enzymes: No results for input(s): CKTOTAL, CKMB, CKMBINDEX, TROPONINI in the last 168 hours.  BNP: Invalid input(s):  POCBNP  CBG: No results for input(s): GLUCAP in the last 168 hours.  Microbiology: Results for orders placed or performed during the hospital encounter of 09/21/21  Resp Panel by RT-PCR (Flu A&B, Covid) Nasopharyngeal Swab     Status: None   Collection Time: 09/21/21 11:17 AM   Specimen: Nasopharyngeal Swab; Nasopharyngeal(NP) swabs in vial transport medium  Result Value Ref Range Status   SARS Coronavirus 2 by RT PCR NEGATIVE NEGATIVE Final    Comment: (NOTE) SARS-CoV-2 target nucleic acids are NOT DETECTED.  The SARS-CoV-2 RNA is generally detectable in upper respiratory specimens during the acute phase of infection. The lowest concentration of SARS-CoV-2 viral copies this assay can detect is 138 copies/mL. A negative result does not preclude SARS-Cov-2 infection and should not be used as the sole basis for treatment or other patient management decisions. A negative result may occur with  improper specimen collection/handling, submission of specimen other than nasopharyngeal swab, presence of viral mutation(s) within the areas targeted by this assay, and inadequate number of viral copies(<138 copies/mL). A negative result must be combined with clinical observations, patient history, and epidemiological information. The expected result is Negative.  Fact Sheet for Patients:  EntrepreneurPulse.com.au  Fact Sheet for  Healthcare Providers:  IncredibleEmployment.be  This test is no t yet approved or cleared by the [place] and  has been authorized for detection and/or diagnosis of SARS-CoV-2 by FDA under an Emergency Use Authorization (EUA). This EUA will remain  in effect (meaning this test can be used) for the duration of the COVID-19 declaration under Section 564(b)(1) of the Act, 21 U.S.C.section 360bbb-3(b)(1), unless the authorization is terminated  or revoked sooner.       Influenza A by PCR NEGATIVE NEGATIVE Final    Influenza B by PCR NEGATIVE NEGATIVE Final    Comment: (NOTE) The Xpert Xpress SARS-CoV-2/FLU/RSV plus assay is intended as an aid in the diagnosis of influenza from Nasopharyngeal swab specimens and should not be used as a sole basis for treatment. Nasal washings and aspirates are unacceptable for Xpert Xpress SARS-CoV-2/FLU/RSV testing.  Fact Sheet for Patients: EntrepreneurPulse.com.au  Fact Sheet for Healthcare Providers: IncredibleEmployment.be  This test is not yet approved or cleared by the [place] FDA and has been authorized for detection and/or diagnosis of SARS-CoV-2 by FDA under an Emergency Use Authorization (EUA). This EUA will remain in effect (meaning this test can be used) for the duration of the COVID-19 declaration under Section 564(b)(1) of the Act, 21 U.S.C. section 360bbb-3(b)(1), unless the authorization is terminated or revoked.  Performed at [facility], [address] [place], [place] [address]     Coagulation Studies: No results for input(s): LABPROT, INR in the last 72 hours.  Urinalysis: No results for input(s): COLORURINE, LABSPEC, PHURINE, GLUCOSEU, HGBUR, BILIRUBINUR, KETONESUR, PROTEINUR, UROBILINOGEN, NITRITE, LEUKOCYTESUR in the last 72 hours.  Invalid input(s): APPERANCEUR    Imaging: DG Chest Port 1 View  Result Date: 09/21/2021 CLINICAL DATA:  Weakness. History of lung cancer. Last radiation therapy a few weeks ago. EXAM: PORTABLE CHEST 1 VIEW COMPARISON:  Chest radiograph of 10/09/2019.  Chest CT 07/20/2021 FINDINGS: Lower cervical spine fixation. Right Port-A-Cath tip at low SVC. Numerous leads and wires project over the chest. Midline trachea. Normal heart size. Atherosclerosis in the transverse aorta. No pleural effusion or pneumothorax. Right mid and upper lung interstitial and airspace disease with underlying right apical and upper lobe cavitary lesion. Clear left lung. IMPRESSION:  Right upper and mid lung interstitial and airspace disease, which given the clinical history is most likely related to evolving radiation change. Concurrent pneumonia cannot be excluded. No left-sided airspace disease. Aortic Atherosclerosis (ICD10-I70.0). Electronically Signed   By: Abigail Miyamoto M.D.   On: 09/21/2021 12:18     Medications:     atorvastatin  40 mg Oral QHS   doxycycline  100 mg Oral BID   enoxaparin (LOVENOX) injection  40 mg Subcutaneous Q24H   fluticasone furoate-vilanterol  1 puff Inhalation Daily   And   umeclidinium bromide  1 puff Inhalation Daily   levothyroxine  150 mcg Oral Q0600   lidocaine  1 patch Transdermal Q24H   metoprolol succinate  25 mg Oral Daily   pantoprazole  40 mg Oral BID   acetaminophen **OR** acetaminophen, albuterol, ALPRAZolam, ipratropium-albuterol, menthol-cetylpyridinium, morphine injection, nicotine, nitroGLYCERIN, ondansetron **OR** ondansetron (ZOFRAN) IV  Assessment/ Plan:  Mr. JONATHYN CAROTHERS is a 65 y.o.  male with medical problems of  non-small cell lung cancer status post radiation and chemotherapy, ongoing 2 pack/day smoking, COPD, hyperlipidemia, anxiety, depression, obstructive sleep apnea-not using CPAP, Raynaud's disease.   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  Risk factors include COPD and emphysema with lung cancer history.  Sodium is low was 120 this admission.  Tolvaptan given this admission with some recovery. Goal sodium 130-132.  Will give additional tolvaptan 15 mg dose today. Discussed fluid restriction at discharge     LOS: 0 Paul Carpenter 11/22/202212:19 PM

## 2021-09-30 ENCOUNTER — Encounter: Payer: Self-pay | Admitting: Radiation Oncology

## 2021-09-30 ENCOUNTER — Other Ambulatory Visit: Payer: Self-pay | Admitting: Emergency Medicine

## 2021-09-30 ENCOUNTER — Ambulatory Visit
Admission: RE | Admit: 2021-09-30 | Discharge: 2021-09-30 | Disposition: A | Discharge status: Home / Self Care | Payer: BC Managed Care – PPO | Source: Ambulatory Visit | Attending: Radiation Oncology | Admitting: Radiation Oncology

## 2021-09-30 ENCOUNTER — Other Ambulatory Visit: Payer: Self-pay | Admitting: *Deleted

## 2021-09-30 ENCOUNTER — Other Ambulatory Visit: Payer: Self-pay

## 2021-09-30 VITALS — BP 128/80 | HR 95 | Temp 97.0°F | Wt 189.3 lb

## 2021-09-30 DIAGNOSIS — C342 Malignant neoplasm of middle lobe, bronchus or lung: Secondary | ICD-10-CM

## 2021-09-30 DIAGNOSIS — E871 Hypo-osmolality and hyponatremia: Secondary | ICD-10-CM | POA: Diagnosis not present

## 2021-09-30 DIAGNOSIS — J449 Chronic obstructive pulmonary disease, unspecified: Secondary | ICD-10-CM | POA: Insufficient documentation

## 2021-09-30 DIAGNOSIS — Z923 Personal history of irradiation: Secondary | ICD-10-CM | POA: Insufficient documentation

## 2021-09-30 DIAGNOSIS — D649 Anemia, unspecified: Secondary | ICD-10-CM | POA: Diagnosis not present

## 2021-09-30 DIAGNOSIS — F17210 Nicotine dependence, cigarettes, uncomplicated: Secondary | ICD-10-CM | POA: Diagnosis not present

## 2021-09-30 DIAGNOSIS — Z95828 Presence of other vascular implants and grafts: Secondary | ICD-10-CM

## 2021-09-30 MED ORDER — LIDOCAINE-PRILOCAINE 2.5-2.5 % EX CREA: 1.0000 "application " | TOPICAL_CREAM | CUTANEOUS | 1 refills | Status: DC | PRN

## 2021-09-30 NOTE — Progress Notes (Signed)
Radiation Oncology Follow up Note  Name: Paul Carpenter   Date:   09/30/2021 MRN:  [id number] DOB: 11-22-1955    This 65 y.o. male presents to the clinic today for 1 month follow-up status post SBRT to his right upper lobe as salvage and the patient status post concurrent chemoradiation therapy for stage IIIa non-small cell lung cancer of the right upper lobe 3 years prior.  REFERRING PROVIDER: Kirk Ruths, MD  HPI: Patient is a 65 year old male now out 1 month having completed salvage SBRT to his right upper lobe and patient treated with concurrent chemoradiation therapy for stage IIIa non-small cell lung cancer of the right upper lobe 3 years prior.Marland Kitchen  He was recently hospitalized with hyponatremia felt secondary to SIADH.  He is also been consulted by nephrology.  He also has chronic anemia at this point.  Also known COPD with chronic respiratory failure in the past.  He is on home oxygen at home.  He has a mild cough no dysphagia at this time.  COMPLICATIONS OF TREATMENT: none  FOLLOW UP COMPLIANCE: keeps appointments   PHYSICAL EXAM:  BP 128/80   Pulse 95   Temp (!) 97 F (36.1 C) (Tympanic)   Wt 189 lb 4.8 oz (85.9 kg)   BMI 25.67 kg/m  Well-developed well-nourished patient in NAD. HEENT reveals PERLA, EOMI, discs not visualized.  Oral cavity is clear. No oral mucosal lesions are identified. Neck is clear without evidence of cervical or supraclavicular adenopathy. Lungs are clear to A&P. Cardiac examination is essentially unremarkable with regular rate and rhythm without murmur rub or thrill. Abdomen is benign with no organomegaly or masses noted. Motor sensory and DTR levels are equal and symmetric in the upper and lower extremities. Cranial nerves II through XII are grossly intact. Proprioception is intact. No peripheral adenopathy or edema is identified. No motor or sensory levels are noted. Crude visual fields are within normal range.  RADIOLOGY RESULTS: Recent chest  x-ray in the hospital showed right upper lobe and midlung interstitial airspace disease most likely related to evolving radiation change.  PLAN: At this time reestablishing his care with Dr. Grayland Ormond.  He also continues close follow-up care with [facility] [facility].  I have asked to see him back in 3 months with a CT scan at that time.  Patient and family know to call with any concerns.  I would like to take this opportunity to thank you for allowing me to participate in the care of your patient.[name], MD

## 2021-10-01 ENCOUNTER — Telehealth: Payer: Self-pay | Admitting: Oncology

## 2021-10-01 ENCOUNTER — Telehealth: Payer: Self-pay | Admitting: *Deleted

## 2021-10-01 NOTE — Telephone Encounter (Signed)
Pt called in stating that he got a call about a 3 hour infusion. He is confused because her know nothing about this. Please give him a call back at [phone]. Appt is for 12-2

## 2021-10-01 NOTE — Telephone Encounter (Signed)
Paul Carpenter Key: [id number] - Rx #: [id number] N PA submitted for EMLA cream   This request has received a Favorable outcome from [facility].  Please keep in mind this is not a guarantee of payment. Eligibility and Benefit determinations will be made at the time of service.  Please note any additional information provided by [facility] at the bottom of the screen.

## 2021-10-02 ENCOUNTER — Other Ambulatory Visit: Payer: Self-pay

## 2021-10-02 ENCOUNTER — Inpatient Hospital Stay: Payer: BC Managed Care – PPO

## 2021-10-02 VITALS — BP 126/82 | HR 89 | Temp 97.0°F

## 2021-10-02 DIAGNOSIS — C342 Malignant neoplasm of middle lobe, bronchus or lung: Secondary | ICD-10-CM | POA: Diagnosis present

## 2021-10-02 DIAGNOSIS — Z79899 Other long term (current) drug therapy: Secondary | ICD-10-CM | POA: Insufficient documentation

## 2021-10-02 DIAGNOSIS — E871 Hypo-osmolality and hyponatremia: Secondary | ICD-10-CM | POA: Insufficient documentation

## 2021-10-02 DIAGNOSIS — E039 Hypothyroidism, unspecified: Secondary | ICD-10-CM | POA: Insufficient documentation

## 2021-10-02 DIAGNOSIS — I509 Heart failure, unspecified: Secondary | ICD-10-CM | POA: Diagnosis not present

## 2021-10-02 DIAGNOSIS — F17210 Nicotine dependence, cigarettes, uncomplicated: Secondary | ICD-10-CM | POA: Insufficient documentation

## 2021-10-02 DIAGNOSIS — D649 Anemia, unspecified: Secondary | ICD-10-CM | POA: Diagnosis not present

## 2021-10-02 DIAGNOSIS — C3411 Malignant neoplasm of upper lobe, right bronchus or lung: Secondary | ICD-10-CM | POA: Diagnosis present

## 2021-10-02 DIAGNOSIS — C3491 Malignant neoplasm of unspecified part of right bronchus or lung: Secondary | ICD-10-CM

## 2021-10-02 DIAGNOSIS — Z923 Personal history of irradiation: Secondary | ICD-10-CM | POA: Diagnosis not present

## 2021-10-02 MED ORDER — SODIUM CHLORIDE 0.9 % IV SOLN
INTRAVENOUS | Status: DC
  Filled 2021-10-02: qty 250

## 2021-10-02 MED ORDER — MAGNESIUM SULFATE 2 GM/50ML IV SOLN
2.0000 g | Freq: Once | INTRAVENOUS | Status: AC
  Administered 2021-10-02: 2 g via INTRAVENOUS
  Filled 2021-10-02: qty 50

## 2021-10-02 MED ORDER — HEPARIN SOD (PORK) LOCK FLUSH 100 UNIT/ML IV SOLN
500.0000 [IU] | Freq: Once | INTRAVENOUS | Status: AC
  Administered 2021-10-02: 500 [IU] via INTRAVENOUS
  Filled 2021-10-02: qty 5

## 2021-10-05 ENCOUNTER — Telehealth: Payer: Self-pay | Admitting: *Deleted

## 2021-10-05 NOTE — Telephone Encounter (Signed)
Pt had labs drawn this morning at [facility] and would like Dr. Grayland Ormond to look at the results to see if anything needs further follow up. Pt has cbc report in [place] to review.   Please advise.

## 2021-10-05 NOTE — Telephone Encounter (Signed)
Pt's wife made aware. 

## 2021-10-08 ENCOUNTER — Telehealth: Payer: Self-pay | Admitting: Urology

## 2021-10-08 NOTE — Telephone Encounter (Signed)
Please let Paul Carpenter know that we have received clearance for him to start the tadalafil 5 mg daily.  If you would like we can go ahead and send a prescription in for him and get him scheduled for a month follow-up for an IPSS and PVR.

## 2021-10-09 NOTE — Telephone Encounter (Signed)
Spoke with patient and he has more issues going on and would like to discuss before starting cialis. Pt scheduled for 12/15

## 2021-10-14 NOTE — Progress Notes (Signed)
10/15/2021  10:59 AM   Paul Carpenter 12-Jul-1956 [id number]  Referring provider: Kirk Ruths, MD [facility] [place],  [place] [address]  Chief Complaint  Patient presents with   Benign Prostatic Hypertrophy    Urological history: 1. ED -contributing factors of age, BPH, HTN, CAD, hypothyroidism, smoking, COPD,  BP pills,  depression and anxiety -SHIM 15  2. BPH with LU TS -cysto 08/2020 - moderate bilateral lobe enlargement  -I PSS 21/3 -PVR 26 mL-08/2021  3. Nephrolithiasis -5 mm left renal stone on 06/02/2021  HPI: Paul Carpenter is a 65 y.o.  male who presents today for one month follow up after a trial of tadalafil 5 mg daily.  He has not started the daily tadalafil.    He has concerns regarding his fatigue and abnormal blood work.  He is having issues with low sodium and anemia.  He has also been placed on a diuretic and is having urinary frequency.   SHIM     Row Name 10/15/21 0954         SHIM: Over the last 6 months:   How do you rate your confidence that you could get and keep an erection? Low     When you had erections with sexual stimulation, how often were your erections hard enough for penetration (entering your partner)? Sometimes (about half the time)     During sexual intercourse, how often were you able to maintain your erection after you had penetrated (entered) your partner? Sometimes (about half the time)     During sexual intercourse, how difficult was it to maintain your erection to completion of intercourse? Slightly Difficult     When you attempted sexual intercourse, how often was it satisfactory for you? Sometimes (about half the time)       SHIM Total Score   SHIM 15                Score: 1-7 Severe ED 8-11 Moderate ED 12-16 Mild-Moderate ED 17-21 Mild ED 22-25 No ED     IPSS     Row Name 10/15/21 0900         International Prostate Symptom Score   How often have you had  the sensation of not emptying your bladder? About half the time     How often have you had to urinate less than every two hours? More than half the time     How often have you found you stopped and started again several times when you urinated? More than half the time     How often have you found it difficult to postpone urination? More than half the time     How often have you had a weak urinary stream? Less than half the time     How often have you had to strain to start urination? Less than 1 in 5 times     How many times did you typically get up at night to urinate? 3 Times     Total IPSS Score 21       Quality of Life due to urinary symptoms   If you were to spend the rest of your life with your urinary condition just the way it is now how would you feel about that? Mixed                Score:  1-7 Mild 8-19 Moderate 20-35 Severe  PMH: Past Medical History:  Diagnosis Date  Anginal pain (HCC)    Anxiety    Asthma    Chest pain    CHF (congestive heart failure) (HCC)    Chicken pox    Complication of anesthesia    o2 dropped after neck fusion   COPD (chronic obstructive pulmonary disease) (HCC)    Coronary artery disease    Cough    chronic  clear phlegm   Dysrhythmia    palpitations   GERD (gastroesophageal reflux disease)    h/o reflux/ hoarsness   Hematochezia    Hemorrhoids    History of chickenpox    History of colon polyps    History of Helicobacter pylori infection    Hoarseness    Hypertension    Lung cancer ([place]) 05/2016   Chemo + rad tx's.    Migraines    OSA (obstructive sleep apnea)    has CPAP but does not use   Personal history of tobacco use, presenting hazards to health 03/05/2016   Pneumonia    5/17   Raynaud disease    Raynaud disease    Raynaud's disease    Rotator cuff tear    on right   Shortness of breath dyspnea    Sleep apnea    Ulcer (traumatic) of oral mucosa     Surgical History: Past Surgical History:  Procedure  Laterality Date   BACK SURGERY     cervical fusion x 2   CARDIAC CATHETERIZATION     CERVICAL DISCECTOMY     x 2   COLONOSCOPY     COLONOSCOPY N/A 07/25/2015   Procedure: COLONOSCOPY;  Surgeon: Lollie Sails, MD;  Location: [facility] ENDOSCOPY;  Service: Endoscopy;  Laterality: N/A;   COLONOSCOPY WITH PROPOFOL N/A 10/04/2017   Procedure: COLONOSCOPY WITH PROPOFOL;  Surgeon: Lollie Sails, MD;  Location[facility] ENDOSCOPY;  Service: Endoscopy;  Laterality: N/A;   COLONOSCOPY WITH PROPOFOL N/A 07/10/2020   Procedure: COLONOSCOPY WITH PROPOFOL;  Surgeon: Toledo, Benay Pike, MD;  Location: [facility] ENDOSCOPY;  Service: Gastroenterology;  Laterality: N/A;   ELECTROMAGNETIC NAVIGATION BROCHOSCOPY Left 06/28/2016   Procedure: ELECTROMAGNETIC NAVIGATION BRONCHOSCOPY;  Surgeon: Vilinda Boehringer, MD;  Location: [facility] ORS;  Service: Cardiopulmonary;  Laterality: Left;   ENDOBRONCHIAL ULTRASOUND N/A 04/11/2018   Procedure: ENDOBRONCHIAL ULTRASOUND;  Surgeon: [name], MD;  Location: [facility] ORS;  Service: Cardiopulmonary;  Laterality: N/A;   ESOPHAGOGASTRODUODENOSCOPY N/A 07/25/2015   Procedure: ESOPHAGOGASTRODUODENOSCOPY (EGD);  Surgeon: Lollie Sails, MD;  Location: [facility] ENDOSCOPY;  Service: Endoscopy;  Laterality: N/A;   ESOPHAGOGASTRODUODENOSCOPY (EGD) WITH PROPOFOL N/A 07/10/2020   Procedure: ESOPHAGOGASTRODUODENOSCOPY (EGD) WITH PROPOFOL;  Surgeon: Toledo, Benay Pike, MD;  Location: [facility] ENDOSCOPY;  Service: Gastroenterology;  Laterality: N/A;   NASAL SINUS SURGERY     x 2    PORTA CATH INSERTION N/A 04/24/2018   Procedure: PORTA CATH INSERTION;  Surgeon: Algernon Huxley, MD;  Location: [facility] CV LAB;  Service: Cardiovascular;  Laterality: N/A;   rotator cuff surgery Right    07/2016   SEPTOPLASTY     SKIN GRAFT      Home Medications:  Allergies as of 10/15/2021       Reactions   Lisinopril Rash   Varenicline Rash        Medication List        Accurate as of October 15, 2021 10:59 AM.  If you have any questions, ask your nurse or doctor.          STOP taking these medications  nitroGLYCERIN 0.4 MG SL tablet Commonly known as: NITROSTAT Stopped by: Zara Council, PA-C       TAKE these medications    acetaminophen 500 MG tablet Commonly known as: TYLENOL Take 500 mg by mouth daily.   albuterol 108 (90 Base) MCG/ACT inhaler Commonly known as: ProAir HFA Inhale 2 puffs into the lungs every 4 (four) hours as needed for wheezing or shortness of breath.   ALPRAZolam 0.25 MG tablet Commonly known as: XANAX Take 0.25 mg by mouth 2 (two) times daily as needed.   atorvastatin 40 MG tablet Commonly known as: LIPITOR Take 40 mg by mouth at bedtime.   doxycycline 100 MG capsule Commonly known as: VIBRAMYCIN Take 100 mg by mouth 2 (two) times daily.   DULoxetine 60 MG capsule Commonly known as: CYMBALTA Take 60 mg by mouth daily.   ipratropium-albuterol 0.5-2.5 (3) MG/3ML Soln Commonly known as: DUONEB Take 3 mLs by nebulization every 6 (six) hours.   levothyroxine 150 MCG tablet Commonly known as: SYNTHROID Take 150 mcg by mouth daily before breakfast.   lidocaine-prilocaine cream Commonly known as: EMLA Apply 1 application topically as needed.   metoprolol succinate 25 MG 24 hr tablet Commonly known as: TOPROL-XL Take 25 mg by mouth daily.   multivitamin with minerals Tabs tablet Take 1 tablet by mouth daily.   ondansetron 8 MG tablet Commonly known as: ZOFRAN Take 1 tablet (8 mg total) by mouth every 8 (eight) hours as needed for nausea or vomiting.   OXYGEN Inhale 2 L into the lungs at bedtime.   pantoprazole 40 MG tablet Commonly known as: PROTONIX Take 40 mg by mouth 2 (two) times daily.   potassium chloride 10 MEQ tablet Commonly known as: KLOR-CON Take 10 mEq by mouth daily.   predniSONE 20 MG tablet Commonly known as: DELTASONE Take 40 mg by mouth daily.   sodium chloride 1 g tablet Take 1 tablet (1 g total) by mouth 3  (three) times daily with meals.   tadalafil 5 MG tablet Commonly known as: CIALIS Take 1 tablet (5 mg total) by mouth daily as needed for erectile dysfunction. Started by: Zara Council, PA-C   torsemide 10 MG tablet Commonly known as: DEMADEX Take 1 tablet (10 mg total) by mouth daily.   Trelegy Ellipta 200-62.5-25 MCG/ACT Aepb Generic drug: Fluticasone-Umeclidin-Vilant Inhale 200 mcg into the lungs daily.        Allergies:  Allergies  Allergen Reactions   Lisinopril Rash   Varenicline Rash    Family History: Family History  Problem Relation Age of Onset   Heart disease Father    Prostate cancer Father    Heart disease Paternal Grandmother    Heart attack Maternal Grandfather 4   Kidney cancer Neg Hx    Bladder Cancer Neg Hx    Other Neg Hx        pituitary abnormality    Social History:  reports that he has been smoking cigarettes. He has a 135.00 pack-year smoking history. He has never used smokeless tobacco. He reports current alcohol use of about 2.0 standard drinks per week. He reports that he does not use drugs.  ROS: For pertinent review of systems please refer to history of present illness  Physical Exam: BP 122/75    Pulse (!) 108    Ht 6' (1.829 m)    Wt 195 lb (88.5 kg)    BMI 26.45 kg/m   Constitutional:  Well nourished. Alert and oriented, No acute distress. HEENT: [place]  AT, moist mucus membranes.  Trachea midline Cardiovascular: No clubbing, cyanosis, or edema. Respiratory: Normal respiratory effort, no increased work of breathing. Neurologic: Grossly intact, no focal deficits, moving all 4 extremities. Psychiatric: Normal mood and affect.   Laboratory Data: Glucose 70 - 110 mg/dL 75   Sodium 136 - 145 mmol/L 128 Low    Potassium 3.6 - 5.1 mmol/L 4.2   Chloride 97 - 109 mmol/L 92 Low    Carbon Dioxide (CO2) 22.0 - 32.0 mmol/L 32.9 High    Urea Nitrogen (BUN) 7 - 25 mg/dL 15   Creatinine 0.7 - 1.3 mg/dL 0.7   Glomerular Filtration Rate  (eGFR), MDRD Estimate >60 mL/min/1.73sq m 114   Calcium 8.7 - 10.3 mg/dL 8.7   AST  8 - 39 U/L 16   ALT  6 - 57 U/L 25   Alk Phos (alkaline Phosphatase) 34 - 104 U/L 97   Albumin 3.5 - 4.8 g/dL 3.5   Bilirubin, Total 0.3 - 1.2 mg/dL 0.4   Protein, Total 6.1 - 7.9 g/dL 6.2   A/G Ratio 1.0 - 5.0 gm/dL 1.3   Resulting Agency  [facility]  Specimen Collected: 10/09/21 09:12 Last Resulted: 10/09/21 10:33  Received From: [facility]  Result Received: 10/14/21 14:29   WBC (White Blood Cell Count) 4.1 - 10.2 103/uL 12.0 High    RBC (Red Blood Cell Count) 4.69 - 6.13 106/uL 3.56 Low    Hemoglobin 14.1 - 18.1 gm/dL 11.5 Low    Hematocrit 40.0 - 52.0 % 34.1 Low    MCV (Mean Corpuscular Volume) 80.0 - 100.0 fl 95.8   MCH (Mean Corpuscular Hemoglobin) 27.0 - 31.2 pg 32.3 High    MCHC (Mean Corpuscular Hemoglobin Concentration) 32.0 - 36.0 gm/dL 33.7   Platelet Count 150 - 450 103/uL 289   RDW-CV (Red Cell Distribution Width) 11.6 - 14.8 % 13.0   MPV (Mean Platelet Volume) 9.4 - 12.4 fl 8.3 Low    Neutrophils 1.50 - 7.80 103/uL 10.18 High    Lymphocytes 1.00 - 3.60 103/uL 0.48 Low    Monocytes 0.00 - 1.50 103/uL 1.17   Eosinophils 0.00 - 0.55 103/uL 0.04   Basophils 0.00 - 0.09 103/uL 0.05   Neutrophil % 32.0 - 70.0 % 84.9 High    Lymphocyte % 10.0 - 50.0 % 4.0 Low    Monocyte % 4.0 - 13.0 % 9.8   Eosinophil % 1.0 - 5.0 % 0.3 Low    Basophil% 0.0 - 2.0 % 0.4   Immature Granulocyte % <=0.7 % 0.6   Immature Granulocyte Count <=0.06 10^3/L 0.07 High    Resulting Agency  [facility]  Specimen Collected: 10/09/21 09:12 Last Resulted: 10/09/21 10:29  Received From: [facility]  Result Received: 10/14/21 14:29  I have reviewed the labs.   Pertinent Imaging N/A  Assessment & Plan:    1. BPH with LUTS -Patient had not started the tadalafil 5 mg daily as of this visit   He is very concerned regarding his fatigue,  anemia and low sodium level.  I explained to him that these conditions are not within the urological specialty and I could not address them.  I stated he need to follow the instructions of his specialists and to keep his follow-up appointments as they run more tests to determine the etiology of his symptoms.   Return in about 1 month (around 11/15/2021) for IPSS and PVR.  Zara Council, PA-C [facility] [address] [place], [place] [address] [phone]  I spent 15 minutes on the day of the encounter to include pre-visit record review, face-to-face time with the patient, and post-visit ordering of tests.

## 2021-10-15 ENCOUNTER — Encounter: Payer: Self-pay | Admitting: Oncology

## 2021-10-15 ENCOUNTER — Ambulatory Visit: Payer: BC Managed Care – PPO | Admitting: Urology

## 2021-10-15 ENCOUNTER — Encounter: Payer: Self-pay | Admitting: Urology

## 2021-10-15 ENCOUNTER — Inpatient Hospital Stay: Payer: BC Managed Care – PPO | Attending: Oncology

## 2021-10-15 ENCOUNTER — Other Ambulatory Visit: Payer: Self-pay

## 2021-10-15 ENCOUNTER — Inpatient Hospital Stay: Payer: BC Managed Care – PPO | Admitting: Oncology

## 2021-10-15 VITALS — BP 124/81 | HR 89 | Temp 97.0°F | Wt 195.5 lb

## 2021-10-15 VITALS — BP 122/75 | HR 108 | Ht 72.0 in | Wt 195.0 lb

## 2021-10-15 DIAGNOSIS — E039 Hypothyroidism, unspecified: Secondary | ICD-10-CM | POA: Insufficient documentation

## 2021-10-15 DIAGNOSIS — N529 Male erectile dysfunction, unspecified: Secondary | ICD-10-CM

## 2021-10-15 DIAGNOSIS — N138 Other obstructive and reflux uropathy: Secondary | ICD-10-CM

## 2021-10-15 DIAGNOSIS — C3491 Malignant neoplasm of unspecified part of right bronchus or lung: Secondary | ICD-10-CM

## 2021-10-15 DIAGNOSIS — N401 Enlarged prostate with lower urinary tract symptoms: Secondary | ICD-10-CM

## 2021-10-15 DIAGNOSIS — E871 Hypo-osmolality and hyponatremia: Secondary | ICD-10-CM | POA: Insufficient documentation

## 2021-10-15 DIAGNOSIS — Z923 Personal history of irradiation: Secondary | ICD-10-CM | POA: Insufficient documentation

## 2021-10-15 DIAGNOSIS — C342 Malignant neoplasm of middle lobe, bronchus or lung: Secondary | ICD-10-CM | POA: Diagnosis not present

## 2021-10-15 DIAGNOSIS — D649 Anemia, unspecified: Secondary | ICD-10-CM | POA: Insufficient documentation

## 2021-10-15 DIAGNOSIS — F17210 Nicotine dependence, cigarettes, uncomplicated: Secondary | ICD-10-CM | POA: Insufficient documentation

## 2021-10-15 DIAGNOSIS — C3411 Malignant neoplasm of upper lobe, right bronchus or lung: Secondary | ICD-10-CM | POA: Insufficient documentation

## 2021-10-15 DIAGNOSIS — R351 Nocturia: Secondary | ICD-10-CM

## 2021-10-15 DIAGNOSIS — I509 Heart failure, unspecified: Secondary | ICD-10-CM | POA: Insufficient documentation

## 2021-10-15 DIAGNOSIS — Z79899 Other long term (current) drug therapy: Secondary | ICD-10-CM | POA: Insufficient documentation

## 2021-10-15 LAB — COMPREHENSIVE METABOLIC PANEL
ALT: 51 U/L — ABNORMAL HIGH (ref 0–44)
AST: 34 U/L (ref 15–41)
Albumin: 3.2 g/dL — ABNORMAL LOW (ref 3.5–5.0)
Alkaline Phosphatase: 99 U/L (ref 38–126)
Anion gap: 12 (ref 5–15)
BUN: 10 mg/dL (ref 8–23)
CO2: 27 mmol/L (ref 22–32)
Calcium: 8.8 mg/dL — ABNORMAL LOW (ref 8.9–10.3)
Chloride: 84 mmol/L — ABNORMAL LOW (ref 98–111)
Creatinine, Ser: 0.73 mg/dL (ref 0.61–1.24)
GFR, Estimated: >60 mL/min (ref >60)
Glucose, Bld: 103 mg/dL — ABNORMAL HIGH (ref 70–99)
Potassium: 4.4 mmol/L (ref 3.5–5.1)
Sodium: 123 mmol/L — ABNORMAL LOW (ref 135–145)
Total Bilirubin: 0.6 mg/dL (ref 0.3–1.2)
Total Protein: 7.1 g/dL (ref 6.5–8.1)

## 2021-10-15 LAB — CBC WITH DIFFERENTIAL/PLATELET
Abs Immature Granulocytes: 0.07 10*3/uL (ref 0.00–0.07)
Basophils Absolute: 0.1 10*3/uL (ref 0.0–0.1)
Basophils Relative: 1 %
Eosinophils Absolute: 0.1 10*3/uL (ref 0.0–0.5)
Eosinophils Relative: 1 %
HCT: 32.9 % — ABNORMAL LOW (ref 39.0–52.0)
Hemoglobin: 11 g/dL — ABNORMAL LOW (ref 13.0–17.0)
Immature Granulocytes: 1 %
Lymphocytes Relative: 5 %
Lymphs Abs: 0.5 10*3/uL — ABNORMAL LOW (ref 0.7–4.0)
MCH: 31.4 pg (ref 26.0–34.0)
MCHC: 33.4 g/dL (ref 30.0–36.0)
MCV: 94 fL (ref 80.0–100.0)
Monocytes Absolute: 0.8 10*3/uL (ref 0.1–1.0)
Monocytes Relative: 8 %
Neutro Abs: 8.2 10*3/uL — ABNORMAL HIGH (ref 1.7–7.7)
Neutrophils Relative %: 84 %
Platelets: 268 10*3/uL (ref 150–400)
RBC: 3.5 MIL/uL — ABNORMAL LOW (ref 4.22–5.81)
RDW: 13 % (ref 11.5–15.5)
WBC: 9.7 10*3/uL (ref 4.0–10.5)
nRBC: 0 % (ref 0.0–0.2)

## 2021-10-15 LAB — IRON AND TIBC
Iron: 30 ug/dL — ABNORMAL LOW (ref 45–182)
Saturation Ratios: 12 % — ABNORMAL LOW (ref 17.9–39.5)
TIBC: 251 ug/dL (ref 250–450)
UIBC: 221 ug/dL

## 2021-10-15 LAB — FERRITIN: Ferritin: 647 ng/mL — ABNORMAL HIGH (ref 24–336)

## 2021-10-15 LAB — MAGNESIUM: Magnesium: 1.9 mg/dL (ref 1.7–2.4)

## 2021-10-15 MED ORDER — TADALAFIL 5 MG PO TABS: 5.0000 mg | ORAL_TABLET | Freq: Every day | ORAL | 3 refills | Status: DC | PRN

## 2021-10-15 NOTE — Progress Notes (Signed)
[place]  [phone] [phone]  ID: [name] OB: [birth year]  MR#: [id number]  XBL#:[id number]  Patient Care Team: Kirk Ruths, MD as PCP - General (Internal Medicine) Lucky Cowboy, Erskine Squibb, MD as Referring Physician (Vascular Surgery) [name], MD as Consulting Physician (Oncology) [name], MD as Referring Physician (Radiation Oncology)   CHIEF COMPLAINT: Recurrent stage IIIa non-small cell lung cancer, right middle lobe.  INTERVAL HISTORY: Patient returns to clinic today for repeat laboratory work and further evaluation.  He was recently admitted to the hospital for severe hyponatremia.  He completed XRT to his recurrent disease in his lung approximately 1 month ago.  He has chronic weakness and fatigue and a poor appetite.  His weight has remained relatively stable since July 2022.  He continues to be anxious.  He continues to have chronic cough.  He continues to smoke.  He has no neurologic complaints.  He denies any recent fevers or illnesses.  He denies any chest pain, shortness of breath, or hemoptysis. He denies any vomiting, constipation, or diarrhea. He has no urinary complaints.  Patient feels generally terrible, but offers no further specific complaints today.  REVIEW OF SYSTEMS:   Review of Systems  Constitutional:  Positive for malaise/fatigue. Negative for fever and weight loss.  HENT: Negative.  Negative for congestion and sore throat.   Respiratory:  Positive for cough. Negative for hemoptysis and shortness of breath.   Cardiovascular: Negative.  Negative for chest pain and leg swelling.  Gastrointestinal: Negative.  Negative for abdominal pain, blood in stool, melena and nausea.  Genitourinary: Negative.  Negative for dysuria.  Musculoskeletal: Negative.  Negative for joint pain.  Skin: Negative.  Negative for rash.  Neurological:  Positive for weakness. Negative for dizziness, sensory change, focal  weakness and headaches.  Psychiatric/Behavioral:  The patient is nervous/anxious.    As per HPI. Otherwise, a complete review of systems is negative.  PAST MEDICAL HISTORY: Past Medical History:  Diagnosis Date   Anginal pain ([place])    Anxiety    Asthma    Chest pain    CHF (congestive heart failure) (HCC)    Chicken pox    Complication of anesthesia    o2 dropped after neck fusion   COPD (chronic obstructive pulmonary disease) (HCC)    Coronary artery disease    Cough    chronic  clear phlegm   Dysrhythmia    palpitations   GERD (gastroesophageal reflux disease)    h/o reflux/ hoarsness   Hematochezia    Hemorrhoids    History of chickenpox    History of colon polyps    History of Helicobacter pylori infection    Hoarseness    Hypertension    Lung cancer ([place]) 05/2016   Chemo + rad tx's.    Migraines    OSA (obstructive sleep apnea)    has CPAP but does not use   Personal history of tobacco use, presenting hazards to health 03/05/2016   Pneumonia    5/17   Raynaud disease    Raynaud disease    Raynaud's disease    Rotator cuff tear    on right   Shortness of breath dyspnea    Sleep apnea    Ulcer (traumatic) of oral mucosa     PAST SURGICAL HISTORY: Past Surgical History:  Procedure Laterality Date   BACK SURGERY     cervical fusion x 2   CARDIAC CATHETERIZATION     CERVICAL DISCECTOMY  x 2   COLONOSCOPY     COLONOSCOPY N/A 07/25/2015   Procedure: COLONOSCOPY;  Surgeon: Lollie Sails, MD;  Location: [facility] ENDOSCOPY;  Service: Endoscopy;  Laterality: N/A;   COLONOSCOPY WITH PROPOFOL N/A 10/04/2017   Procedure: COLONOSCOPY WITH PROPOFOL;  Surgeon: Lollie Sails, MD;  Location: [facility] ENDOSCOPY;  Service: Endoscopy;  Laterality: N/A;   COLONOSCOPY WITH PROPOFOL N/A 07/10/2020   Procedure: COLONOSCOPY WITH PROPOFOL;  Surgeon: Toledo, Benay Pike, MD;  Location: [facility] ENDOSCOPY;  Service: Gastroenterology;  Laterality: N/A;   ELECTROMAGNETIC NAVIGATION  BROCHOSCOPY Left 06/28/2016   Procedure: ELECTROMAGNETIC NAVIGATION BRONCHOSCOPY;  Surgeon: Vilinda Boehringer, MD;  Location: [facility] ORS;  Service: Cardiopulmonary;  Laterality: Left;   ENDOBRONCHIAL ULTRASOUND N/A 04/11/2018   Procedure: ENDOBRONCHIAL ULTRASOUND;  Surgeon: [name], MD;  Location: [facility] ORS;  Service: Cardiopulmonary;  Laterality: N/A;   ESOPHAGOGASTRODUODENOSCOPY N/A 07/25/2015   Procedure: ESOPHAGOGASTRODUODENOSCOPY (EGD);  Surgeon: Lollie Sails, MD;  Location: [facility] ENDOSCOPY;  Service: Endoscopy;  Laterality: N/A;   ESOPHAGOGASTRODUODENOSCOPY (EGD) WITH PROPOFOL N/A 07/10/2020   Procedure: ESOPHAGOGASTRODUODENOSCOPY (EGD) WITH PROPOFOL;  Surgeon: Toledo, Benay Pike, MD;  Location: [facility] ENDOSCOPY;  Service: Gastroenterology;  Laterality: N/A;   NASAL SINUS SURGERY     x 2    PORTA CATH INSERTION N/A 04/24/2018   Procedure: PORTA CATH INSERTION;  Surgeon: Algernon Huxley, MD;  Location: [facility];  Service: Cardiovascular;  Laterality: N/A;   rotator cuff surgery Right    07/2016   SEPTOPLASTY     SKIN GRAFT      FAMILY HISTORY Family History  Problem Relation Age of Onset   Heart disease Father    Prostate cancer Father    Heart disease Paternal Grandmother    Heart attack Maternal Grandfather 39   Kidney cancer Neg Hx    Bladder Cancer Neg Hx    Other Neg Hx        pituitary abnormality       ADVANCED DIRECTIVES:    HEALTH MAINTENANCE: Social History   Tobacco Use   Smoking status: Every Day    Packs/day: 3.00    Years: 45.00    Pack years: 135.00    Types: Cigarettes   Smokeless tobacco: Never   Tobacco comments:    2 PPD 04/21/2021  Vaping Use   Vaping Use: Never used  Substance Use Topics   Alcohol use: Yes    Alcohol/week: 2.0 standard drinks    Types: 2 Standard drinks or equivalent per week    Comment: moderate   Drug use: No     Allergies  Allergen Reactions   Lisinopril Rash   Varenicline Rash    Current Outpatient  Medications  Medication Sig Dispense Refill   acetaminophen (TYLENOL) 500 MG tablet Take 500 mg by mouth daily.     albuterol (PROAIR HFA) 108 (90 Base) MCG/ACT inhaler Inhale 2 puffs into the lungs every 4 (four) hours as needed for wheezing or shortness of breath. 1 Inhaler 1   ALPRAZolam (XANAX) 0.25 MG tablet Take 0.25 mg by mouth 2 (two) times daily as needed.     atorvastatin (LIPITOR) 40 MG tablet Take 40 mg by mouth at bedtime.      DULoxetine (CYMBALTA) 60 MG capsule Take 60 mg by mouth daily.     ferrous sulfate 325 (65 FE) MG tablet Take 325 mg by mouth daily with breakfast.     Fluticasone-Umeclidin-Vilant (TRELEGY ELLIPTA) 200-62.5-25 MCG/INH AEPB Inhale 200 mcg into the lungs daily.  28 each 0   ipratropium-albuterol (DUONEB) 0.5-2.5 (3) MG/3ML SOLN Take 3 mLs by nebulization every 6 (six) hours. 360 mL 5   levothyroxine (SYNTHROID) 150 MCG tablet Take 150 mcg by mouth daily before breakfast.     lidocaine-prilocaine (EMLA) cream Apply 1 application topically as needed. 30 g 1   magnesium oxide (MAG-OX) 400 MG tablet Take 800 mg by mouth daily. Takes 800 mg qd     metoprolol succinate (TOPROL-XL) 25 MG 24 hr tablet Take 25 mg by mouth daily.     Multiple Vitamin (MULTIVITAMIN WITH MINERALS) TABS tablet Take 1 tablet by mouth daily.      ondansetron (ZOFRAN) 8 MG tablet Take 1 tablet (8 mg total) by mouth every 8 (eight) hours as needed for nausea or vomiting. 30 tablet 1   pantoprazole (PROTONIX) 40 MG tablet Take 40 mg by mouth 2 (two) times daily.     potassium chloride (K-DUR) 10 MEQ tablet Take 10 mEq by mouth daily.  3   sodium chloride 1 g tablet Take 1 tablet (1 g total) by mouth 3 (three) times daily with meals. 90 tablet 1   torsemide (DEMADEX) 10 MG tablet Take 1 tablet (10 mg total) by mouth daily. (Patient taking differently: Take 10 mg by mouth daily. Taking 1/2 tab qd) 30 tablet 1   OXYGEN Inhale 2 L into the lungs at bedtime. (Patient not taking: Reported on  10/15/2021)     predniSONE (DELTASONE) 20 MG tablet Take 40 mg by mouth daily. (Patient not taking: Reported on 10/15/2021)     tadalafil (CIALIS) 5 MG tablet Take 1 tablet (5 mg total) by mouth daily as needed for erectile dysfunction. (Patient not taking: Reported on 10/15/2021) 90 tablet 3   No current facility-administered medications for this visit.    OBJECTIVE: Vitals:   10/15/21 1401  BP: 124/81  Pulse: 89  Temp: (!) 97 F (36.1 C)  SpO2: 100%      Body mass index is 26.51 kg/m.    ECOG FS:1 - Symptomatic but completely ambulatory  General: Well-developed, well-nourished, no acute distress. Eyes: Pink conjunctiva, anicteric sclera. HEENT: Normocephalic, moist mucous membranes. Lungs: No audible wheezing or coughing. Heart: Regular rate and rhythm. Abdomen: Soft, nontender, no obvious distention. Musculoskeletal: No edema, cyanosis, or clubbing. Neuro: Alert, answering all questions appropriately. Cranial nerves grossly intact. Skin: No rashes or petechiae noted. Psych: Normal affect.   LAB RESULTS:  Lab Results  Component Value Date   NA 123 (L) 10/15/2021   K 4.4 10/15/2021   CL 84 (L) 10/15/2021   CO2 27 10/15/2021   GLUCOSE 103 (H) 10/15/2021   BUN 10 10/15/2021   CREATININE 0.73 10/15/2021   CALCIUM 8.8 (L) 10/15/2021   PROT 7.1 10/15/2021   ALBUMIN 3.2 (L) 10/15/2021   AST 34 10/15/2021   ALT 51 (H) 10/15/2021   ALKPHOS 99 10/15/2021   BILITOT 0.6 10/15/2021   GFRNONAA >60 10/15/2021   GFRAA >60 04/24/2020    Lab Results  Component Value Date   WBC 9.7 10/15/2021   NEUTROABS 8.2 (H) 10/15/2021   HGB 11.0 (L) 10/15/2021   HCT 32.9 (L) 10/15/2021   MCV 94.0 10/15/2021   PLT 268 10/15/2021   Lab Results  Component Value Date   IRON 30 (L) 10/15/2021   TIBC 251 10/15/2021   IRONPCTSAT 12 (L) 10/15/2021   Lab Results  Component Value Date   FERRITIN 647 (H) 10/15/2021     STUDIES:  ONCOLOGY HISTORY: Case  discussed with pathologist  and unable to determine whether this is adenocarcinoma or squamous cell carcinoma.  There is also insufficient tissue to do further testing.  Liquid biopsy did not reveal any actionable mutations.  MRI of the brain completed on Mar 28, 2018 reviewed independently did not reveal metastatic disease.  Patient completed XRT June 26, 2018.  He completed his concurrent single agent carboplatinum on June 21, 2018.  Patient had a reaction to Taxol during cycle 1 and this was discontinued.  He completed a year-long of maintenance durvalumab on June 27, 2019.   ASSESSMENT: Recurrent stage IIIa non-small cell lung cancer, right middle lobe.  PLAN:    1.  Recurrent stage IIIa non-small cell lung cancer, right middle lobe: See oncology history above.  CT scan results from May 20, 2021 reviewed independently with concern for slight progression of disease.  Changes on CT scan may also be inflammatory given his recent episodes of bronchitis.  PET scan results from June 02, 2021 reviewed independently with only mild increase in SUV from 3.3-3.5.  There are no other areas of significant hypermetabolism.  Recommendation from radiology was to repeat CT scan in a short interval which was completed on July 20, 2021.  The scan was reviewed independently and appears to show mild progression of disease locally in the right middle lobe lesion.  No other evidence of malignancy is noted.  There is mild progression did not appear to warrant systemic chemotherapy, and patient was referred back to to radiation oncology for XRT which was completed approximately 1 month ago.  He has been instructed to keep his previously scheduled follow-up CT scan in February 2023.  Return to clinic on December 30, 2021 for further evaluation and discussion of his imaging results.  2.  Anemia: Mild.  Patient's iron stores and iron saturation are minimally decreased, his ferritin is likely elevated secondary to acute phase reactant.  No intervention  is needed.  Continue oral iron supplementation. 3.  Pulmonary nodule: Imaging and PET scan as above. 4.  Colon polyps:  Patient has a personal history of greater then 10 adenomatous polyps on his most recent conoloscopy. He does not know of any family history of increased polyps or colon cancer.  Genetic testing to assess for the APC mutation for FAP or AFAP was negative. Continue colonoscopies as per GI. 5. Tobacco Use: Chronic and unchanged.  Patient continues to heavily smoke.  He previously expressed understanding by continuing tobacco use increases his chance of recurrence. 6. Anxiety: Chronic and unchanged.  Continue Valium 10 mg every 12 hours as needed.  Continue treatment and evaluation per primary care. 7.  Hyponatremia: Sodium level slightly worse today at 123.  Continue salt tablets as per nephrology.  8.  Hypothyroidism: Appreciate endocrinology input.  Continue Synthroid as prescribed.  Follow-up with endocrinology as scheduled. 9.  CHF: Patient has an EF of 40%.  Continue evaluation and treatment per cardiology. 10.  Leukocytosis: Resolved. 11.  Nausea: Patient does not complain of this today.  Continue Zofran as needed.    Patient expressed understanding and was in agreement with this plan. He also understands that He can call clinic at any time with any questions, concerns, or complaints.    [name], MD   10/15/2021 9:19 PM

## 2021-10-16 ENCOUNTER — Other Ambulatory Visit: Payer: Self-pay | Admitting: Nurse Practitioner

## 2021-10-16 ENCOUNTER — Other Ambulatory Visit: Payer: Self-pay | Admitting: Oncology

## 2021-10-16 ENCOUNTER — Encounter: Payer: Self-pay | Admitting: Oncology

## 2021-10-16 ENCOUNTER — Telehealth: Payer: Self-pay | Admitting: *Deleted

## 2021-10-16 ENCOUNTER — Inpatient Hospital Stay: Payer: BC Managed Care – PPO

## 2021-10-16 ENCOUNTER — Other Ambulatory Visit: Payer: Self-pay | Admitting: Emergency Medicine

## 2021-10-16 VITALS — BP 132/79 | HR 83 | Temp 99.8°F | Resp 18

## 2021-10-16 DIAGNOSIS — Z95828 Presence of other vascular implants and grafts: Secondary | ICD-10-CM

## 2021-10-16 DIAGNOSIS — E871 Hypo-osmolality and hyponatremia: Secondary | ICD-10-CM

## 2021-10-16 DIAGNOSIS — C342 Malignant neoplasm of middle lobe, bronchus or lung: Secondary | ICD-10-CM | POA: Diagnosis not present

## 2021-10-16 DIAGNOSIS — D751 Secondary polycythemia: Secondary | ICD-10-CM

## 2021-10-16 MED ORDER — SODIUM CHLORIDE 0.9 % IV SOLN
Freq: Once | INTRAVENOUS | Status: AC
  Filled 2021-10-16: qty 250

## 2021-10-16 MED ORDER — IRON SUCROSE 20 MG/ML IV SOLN
200.0000 mg | Freq: Once | INTRAVENOUS | Status: AC
  Administered 2021-10-16: 200 mg via INTRAVENOUS
  Filled 2021-10-16: qty 10

## 2021-10-16 MED ORDER — SODIUM CHLORIDE 0.9 % IV SOLN: 200.0000 mg | Freq: Once | INTRAVENOUS | Status: DC

## 2021-10-16 MED ORDER — SODIUM CHLORIDE 0.9% FLUSH
10.0000 mL | Freq: Once | INTRAVENOUS | Status: AC
  Administered 2021-10-16: 10 mL via INTRAVENOUS
  Filled 2021-10-16: qty 10

## 2021-10-16 MED ORDER — HEPARIN SOD (PORK) LOCK FLUSH 100 UNIT/ML IV SOLN
INTRAVENOUS | Status: AC
  Administered 2021-10-16: 500 [IU] via INTRAVENOUS
  Filled 2021-10-16: qty 5

## 2021-10-16 MED ORDER — HEPARIN SOD (PORK) LOCK FLUSH 100 UNIT/ML IV SOLN
500.0000 [IU] | Freq: Once | INTRAVENOUS | Status: AC
  Filled 2021-10-16: qty 5

## 2021-10-16 NOTE — Telephone Encounter (Signed)
Referral to nephrology placed. Pt aware to come in today for venofer and fluids.

## 2021-10-16 NOTE — Telephone Encounter (Signed)
Incoming call from patient PCP regarding patient results form yesterday as Paul Carpenter had called them and asked that Dr Ouida Sills order IV fluids to be given to patient at the cancer center today. I told her that we are working on the message sent this morning by her and that she had also called [place] and has not given [place] time to respond.  I called Robin to let her know Dr [name] response of needing to contact Nephrologist regarding hs sodium and she stated that he does not have a nephrologist he only saw one in the hospital and has had no follow up since he was discharged. She is asking if he can possibly get IV fluids in office today. I also let her know that I told Mickel Baas about the fact that Dr Ouida Sills needs to handle the Cymbalta and patient coming off of it. I let her know that Jonelle Sidle will be calling her regarding the iron infusions later. Again she asked if he could get IV fluids today. Please advise

## 2021-10-16 NOTE — Patient Instructions (Signed)

## 2021-10-16 NOTE — Telephone Encounter (Signed)
This is being addressed via MyChart message that was sent this morning

## 2021-10-20 ENCOUNTER — Other Ambulatory Visit: Payer: Self-pay | Admitting: Family Medicine

## 2021-10-20 DIAGNOSIS — E871 Hypo-osmolality and hyponatremia: Secondary | ICD-10-CM

## 2021-10-20 DIAGNOSIS — C3491 Malignant neoplasm of unspecified part of right bronchus or lung: Secondary | ICD-10-CM

## 2021-10-20 DIAGNOSIS — R110 Nausea: Secondary | ICD-10-CM

## 2021-10-30 ENCOUNTER — Telehealth: Payer: Self-pay | Admitting: *Deleted

## 2021-10-30 NOTE — Telephone Encounter (Signed)
Wife informed that images were requested for comparison and we will get back to her once we have that. She told me that Dr Baruch Gouty looked at images and was not concerned as it is the area he just finished radiating.but was glad to hear that we were going to do a comparison of images and get back to them

## 2021-10-30 NOTE — Telephone Encounter (Signed)
Shirlean Mylar called reporting that patient had bad fall resulting in going to ER at [facility] where xray's were done and found that he has a large lung mass. Asking if he needs more treatment or what to do. Please advise  Chest -- Computed Tomography  Vascular -- --   Impression   1. No traumatic aortic injury   2. Sclerotic changes involving the anterior aspect of third through sixth ribs on the left likely relate to subacute nondisplaced rib fractures. No displaced rib fractures are evident, and there are no associated associated pneumothorax or pneumothorax.   3. Large right upper lobe soft tissue mass with similar abnormal tissue involving the right pulmonary hilum extending medially into the ipsilateral mediastinum is in keeping with the reported history of primary lung cancer.   4. Scattered areas of centrilobular micronodularity involving all 3 lobes of the right lung may relate to infection or aspiration. Larger, more isolated nodules may relate to metastatic neoplastic disease.   5. A heavy burden of calcified atherosclerotic disease is evident within the coronary arterial distribution.

## 2021-10-30 NOTE — Telephone Encounter (Signed)
Imaging requested through [employer].

## 2021-11-03 ENCOUNTER — Ambulatory Visit
Admission: RE | Admit: 2021-11-03 | Discharge: 2021-11-03 | Disposition: A | Discharge status: Home / Self Care | Payer: Self-pay | Source: Ambulatory Visit | Attending: Oncology | Admitting: Oncology

## 2021-11-03 ENCOUNTER — Observation Stay: Payer: BC Managed Care – PPO

## 2021-11-03 ENCOUNTER — Inpatient Hospital Stay: Payer: BC Managed Care – PPO

## 2021-11-03 ENCOUNTER — Other Ambulatory Visit: Payer: Self-pay

## 2021-11-03 ENCOUNTER — Inpatient Hospital Stay: Payer: BC Managed Care – PPO | Attending: Hospice and Palliative Medicine | Admitting: Hospice and Palliative Medicine

## 2021-11-03 ENCOUNTER — Observation Stay
Admission: EM | Admit: 2021-11-03 | Discharge: 2021-11-05 | Disposition: A | Discharge status: Home / Self Care | Payer: BC Managed Care – PPO | Attending: Family Medicine | Admitting: Family Medicine

## 2021-11-03 ENCOUNTER — Telehealth: Payer: Self-pay | Admitting: *Deleted

## 2021-11-03 ENCOUNTER — Other Ambulatory Visit: Payer: Self-pay | Admitting: Emergency Medicine

## 2021-11-03 VITALS — BP 110/70 | HR 108 | Temp 98.8°F | Resp 16 | Wt 189.0 lb

## 2021-11-03 DIAGNOSIS — Z85118 Personal history of other malignant neoplasm of bronchus and lung: Secondary | ICD-10-CM | POA: Insufficient documentation

## 2021-11-03 DIAGNOSIS — E039 Hypothyroidism, unspecified: Secondary | ICD-10-CM | POA: Insufficient documentation

## 2021-11-03 DIAGNOSIS — F17210 Nicotine dependence, cigarettes, uncomplicated: Secondary | ICD-10-CM | POA: Insufficient documentation

## 2021-11-03 DIAGNOSIS — C342 Malignant neoplasm of middle lobe, bronchus or lung: Secondary | ICD-10-CM | POA: Insufficient documentation

## 2021-11-03 DIAGNOSIS — F419 Anxiety disorder, unspecified: Secondary | ICD-10-CM | POA: Diagnosis present

## 2021-11-03 DIAGNOSIS — I10 Essential (primary) hypertension: Secondary | ICD-10-CM | POA: Insufficient documentation

## 2021-11-03 DIAGNOSIS — C3491 Malignant neoplasm of unspecified part of right bronchus or lung: Secondary | ICD-10-CM

## 2021-11-03 DIAGNOSIS — D72829 Elevated white blood cell count, unspecified: Secondary | ICD-10-CM | POA: Insufficient documentation

## 2021-11-03 DIAGNOSIS — E222 Syndrome of inappropriate secretion of antidiuretic hormone: Secondary | ICD-10-CM | POA: Insufficient documentation

## 2021-11-03 DIAGNOSIS — J449 Chronic obstructive pulmonary disease, unspecified: Secondary | ICD-10-CM | POA: Insufficient documentation

## 2021-11-03 DIAGNOSIS — Z20822 Contact with and (suspected) exposure to covid-19: Secondary | ICD-10-CM | POA: Insufficient documentation

## 2021-11-03 DIAGNOSIS — E782 Mixed hyperlipidemia: Secondary | ICD-10-CM | POA: Diagnosis present

## 2021-11-03 DIAGNOSIS — Z5189 Encounter for other specified aftercare: Secondary | ICD-10-CM | POA: Diagnosis not present

## 2021-11-03 DIAGNOSIS — Z5111 Encounter for antineoplastic chemotherapy: Secondary | ICD-10-CM | POA: Diagnosis present

## 2021-11-03 DIAGNOSIS — I509 Heart failure, unspecified: Secondary | ICD-10-CM | POA: Insufficient documentation

## 2021-11-03 DIAGNOSIS — F17200 Nicotine dependence, unspecified, uncomplicated: Secondary | ICD-10-CM | POA: Diagnosis present

## 2021-11-03 DIAGNOSIS — E871 Hypo-osmolality and hyponatremia: Secondary | ICD-10-CM | POA: Diagnosis not present

## 2021-11-03 DIAGNOSIS — D509 Iron deficiency anemia, unspecified: Secondary | ICD-10-CM | POA: Diagnosis not present

## 2021-11-03 DIAGNOSIS — D751 Secondary polycythemia: Secondary | ICD-10-CM

## 2021-11-03 DIAGNOSIS — R531 Weakness: Secondary | ICD-10-CM | POA: Diagnosis present

## 2021-11-03 DIAGNOSIS — Z79899 Other long term (current) drug therapy: Secondary | ICD-10-CM | POA: Insufficient documentation

## 2021-11-03 DIAGNOSIS — Z87891 Personal history of nicotine dependence: Secondary | ICD-10-CM

## 2021-11-03 DIAGNOSIS — G4733 Obstructive sleep apnea (adult) (pediatric): Secondary | ICD-10-CM | POA: Diagnosis present

## 2021-11-03 DIAGNOSIS — E663 Overweight: Secondary | ICD-10-CM | POA: Diagnosis present

## 2021-11-03 LAB — CBC WITH DIFFERENTIAL/PLATELET
Abs Immature Granulocytes: 0.06 10*3/uL (ref 0.00–0.07)
Basophils Absolute: 0.1 10*3/uL (ref 0.0–0.1)
Basophils Relative: 1 %
Eosinophils Absolute: 0.1 10*3/uL (ref 0.0–0.5)
Eosinophils Relative: 1 %
HCT: 29.5 % — ABNORMAL LOW (ref 39.0–52.0)
Hemoglobin: 10.1 g/dL — ABNORMAL LOW (ref 13.0–17.0)
Immature Granulocytes: 1 %
Lymphocytes Relative: 4 %
Lymphs Abs: 0.5 10*3/uL — ABNORMAL LOW (ref 0.7–4.0)
MCH: 30.9 pg (ref 26.0–34.0)
MCHC: 34.2 g/dL (ref 30.0–36.0)
MCV: 90.2 fL (ref 80.0–100.0)
Monocytes Absolute: 0.8 10*3/uL (ref 0.1–1.0)
Monocytes Relative: 8 %
Neutro Abs: 9.2 10*3/uL — ABNORMAL HIGH (ref 1.7–7.7)
Neutrophils Relative %: 85 %
Platelets: 366 10*3/uL (ref 150–400)
RBC: 3.27 MIL/uL — ABNORMAL LOW (ref 4.22–5.81)
RDW: 13.4 % (ref 11.5–15.5)
WBC: 10.6 10*3/uL — ABNORMAL HIGH (ref 4.0–10.5)
nRBC: 0 % (ref 0.0–0.2)

## 2021-11-03 LAB — COMPREHENSIVE METABOLIC PANEL
ALT: 29 U/L (ref 0–44)
AST: 23 U/L (ref 15–41)
Albumin: 2.9 g/dL — ABNORMAL LOW (ref 3.5–5.0)
Alkaline Phosphatase: 105 U/L (ref 38–126)
Anion gap: 12 (ref 5–15)
BUN: 13 mg/dL (ref 8–23)
CO2: 25 mmol/L (ref 22–32)
Calcium: 8.7 mg/dL — ABNORMAL LOW (ref 8.9–10.3)
Chloride: 85 mmol/L — ABNORMAL LOW (ref 98–111)
Creatinine, Ser: 0.85 mg/dL (ref 0.61–1.24)
GFR, Estimated: >60 mL/min (ref >60)
Glucose, Bld: 150 mg/dL — ABNORMAL HIGH (ref 70–99)
Potassium: 4.1 mmol/L (ref 3.5–5.1)
Sodium: 122 mmol/L — ABNORMAL LOW (ref 135–145)
Total Bilirubin: 0.2 mg/dL — ABNORMAL LOW (ref 0.3–1.2)
Total Protein: 6.8 g/dL (ref 6.5–8.1)

## 2021-11-03 LAB — URINALYSIS, COMPLETE (UACMP) WITH MICROSCOPIC
Bacteria, UA: NONE SEEN
Bilirubin Urine: NEGATIVE
Glucose, UA: NEGATIVE mg/dL
Hgb urine dipstick: NEGATIVE
Ketones, ur: NEGATIVE mg/dL
Leukocytes,Ua: NEGATIVE
Nitrite: NEGATIVE
Protein, ur: NEGATIVE mg/dL
Specific Gravity, Urine: 1.008 (ref 1.005–1.030)
Squamous Epithelial / HPF: NONE SEEN (ref 0–5)
pH: 5 (ref 5.0–8.0)

## 2021-11-03 LAB — RESP PANEL BY RT-PCR (FLU A&B, COVID) ARPGX2
Influenza A by PCR: NEGATIVE
Influenza B by PCR: NEGATIVE
SARS Coronavirus 2 by RT PCR: NEGATIVE

## 2021-11-03 LAB — IRON AND TIBC
Iron: 20 ug/dL — ABNORMAL LOW (ref 45–182)
Saturation Ratios: 9 % — ABNORMAL LOW (ref 17.9–39.5)
TIBC: 224 ug/dL — ABNORMAL LOW (ref 250–450)
UIBC: 204 ug/dL

## 2021-11-03 LAB — FERRITIN: Ferritin: 662 ng/mL — ABNORMAL HIGH (ref 24–336)

## 2021-11-03 LAB — MAGNESIUM: Magnesium: 1.8 mg/dL (ref 1.7–2.4)

## 2021-11-03 MED ORDER — SODIUM CHLORIDE 0.9 % IV BOLUS
250.0000 mL | Freq: Once | INTRAVENOUS | Status: AC
  Administered 2021-11-03: 250 mL via INTRAVENOUS

## 2021-11-03 MED ORDER — ONDANSETRON HCL 4 MG/2ML IJ SOLN: 4.0000 mg | Freq: Four times a day (QID) | INTRAMUSCULAR | Status: DC | PRN

## 2021-11-03 MED ORDER — NICOTINE 21 MG/24HR TD PT24
21.0000 mg | MEDICATED_PATCH | Freq: Every day | TRANSDERMAL | Status: DC | PRN
  Administered 2021-11-04: 06:00:00 21 mg via TRANSDERMAL
  Filled 2021-11-03: qty 1

## 2021-11-03 MED ORDER — ALBUTEROL SULFATE (2.5 MG/3ML) 0.083% IN NEBU: 3.0000 mL | INHALATION_SOLUTION | RESPIRATORY_TRACT | Status: DC | PRN

## 2021-11-03 MED ORDER — ENOXAPARIN SODIUM 40 MG/0.4ML IJ SOSY
40.0000 mg | PREFILLED_SYRINGE | INTRAMUSCULAR | Status: DC
  Administered 2021-11-04 (×2): 40 mg via SUBCUTANEOUS
  Filled 2021-11-03 (×2): qty 0.4

## 2021-11-03 MED ORDER — FLUTICASONE-UMECLIDIN-VILANT 200-62.5-25 MCG/ACT IN AEPB: 200.0000 ug | INHALATION_SPRAY | Freq: Every day | RESPIRATORY_TRACT | Status: DC

## 2021-11-03 MED ORDER — IPRATROPIUM-ALBUTEROL 0.5-2.5 (3) MG/3ML IN SOLN: 3.0000 mL | Freq: Four times a day (QID) | RESPIRATORY_TRACT | Status: DC

## 2021-11-03 MED ORDER — ONDANSETRON HCL 4 MG PO TABS: 4.0000 mg | ORAL_TABLET | Freq: Four times a day (QID) | ORAL | Status: DC | PRN

## 2021-11-03 MED ORDER — SODIUM CHLORIDE 0.9 % IV SOLN: Freq: Once | INTRAVENOUS | Status: AC

## 2021-11-03 MED ORDER — ALPRAZOLAM 0.5 MG PO TABS
0.2500 mg | ORAL_TABLET | Freq: Two times a day (BID) | ORAL | Status: DC | PRN
  Administered 2021-11-04: 16:00:00 0.25 mg via ORAL
  Filled 2021-11-03: qty 1

## 2021-11-03 MED ORDER — ACETAMINOPHEN 650 MG RE SUPP: 650.0000 mg | Freq: Four times a day (QID) | RECTAL | Status: DC | PRN

## 2021-11-03 MED ORDER — LEVOTHYROXINE SODIUM 50 MCG PO TABS
150.0000 ug | ORAL_TABLET | Freq: Every day | ORAL | Status: DC
  Administered 2021-11-04 – 2021-11-05 (×2): 150 ug via ORAL
  Filled 2021-11-03 (×2): qty 3

## 2021-11-03 MED ORDER — DULOXETINE HCL 60 MG PO CPEP: 60.0000 mg | ORAL_CAPSULE | Freq: Every day | ORAL | Status: DC

## 2021-11-03 MED ORDER — ATORVASTATIN CALCIUM 20 MG PO TABS
40.0000 mg | ORAL_TABLET | Freq: Every day | ORAL | Status: DC
  Administered 2021-11-04: 21:00:00 40 mg via ORAL
  Filled 2021-11-03: qty 2

## 2021-11-03 MED ORDER — MORPHINE SULFATE (PF) 2 MG/ML IV SOLN
1.0000 mg | INTRAVENOUS | Status: DC | PRN
  Administered 2021-11-05: 1 mg via INTRAVENOUS
  Filled 2021-11-03: qty 1

## 2021-11-03 MED ORDER — KETOROLAC TROMETHAMINE 30 MG/ML IJ SOLN
15.0000 mg | Freq: Four times a day (QID) | INTRAMUSCULAR | Status: AC | PRN
  Administered 2021-11-04 (×2): 15 mg via INTRAVENOUS
  Filled 2021-11-03 (×2): qty 1

## 2021-11-03 MED ORDER — ACETAMINOPHEN 500 MG PO TABS
1000.0000 mg | ORAL_TABLET | Freq: Four times a day (QID) | ORAL | Status: DC | PRN
  Administered 2021-11-04 (×2): 1000 mg via ORAL
  Filled 2021-11-03 (×2): qty 2

## 2021-11-03 NOTE — Progress Notes (Signed)
Pt presents to [facility] with c/o increasing weakness and lethargy over the past couple of months, but has worsened over the past week.Pt fell 1 week ago and fractured C2 vertebrae. Pt reports that he "blacks out" when he stands, which happened before his fall last week.

## 2021-11-03 NOTE — ED Provider Notes (Signed)
° °  [facility] Provider Note    Event Date/Time   First MD Initiated Contact with Patient 11/03/21 2035   (approximate)   History   Weakness  HPI  Paul Carpenter is a 66 y.o. 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 Family is present in the room and does provide additional history. They have noticed increased weakness over the past couple of months. During this time he has been diagnosed with hyponatremia. Family also discusses episodes of the patient passing out and low blood pressure, the passing out resulted in a fall that caused a C2 fracture recently seen at [facility].       Physical Exam   Triage Vital Signs: ED Triage Vitals  Enc Vitals Group     BP 11/03/21 1728 113/64     Pulse Rate 11/03/21 1728 (!) 102     Resp 11/03/21 1728 20     Temp 11/03/21 1728 97.6 F (36.4 C)     Temp Source 11/03/21 1728 Oral     SpO2 11/03/21 1728 100 %     Weight 11/03/21 1729 189 lb 9.5 oz (86 kg)     Height 11/03/21 1729 6' (1.829 m)     Head Circumference --      Peak Flow --      Pain Score 11/03/21 1729 0     Pain Loc --      Pain Edu? --      Excl. in [place]? --     Most recent vital signs: Vitals:   11/03/21 1930 11/03/21 2000  BP: 111/70 125/66  Pulse: (!) 103 (!) 101  Resp: 18 18  Temp:    SpO2: 98% 98%    General: Awake, no distress.  CV:  Good peripheral perfusion. Tachycardia Resp:  Normal effort.  Abd:  No distention.  MSK:  Cervical collar in place.      ED Results / Procedures / Treatments   Labs (all labs ordered are listed, but only abnormal results are displayed) COVID/influenza negative    EKG  None   RADIOLOGY None   PROCEDURES:  Critical Care performed: No  Procedures   MEDICATIONS  ORDERED IN ED: Medications  sodium chloride 0.9 % bolus 250 mL (0 mLs Intravenous Stopped 11/03/21 1905)  0.9 %  sodium chloride infusion ( Intravenous New Bag/Given 11/03/21 1904)     IMPRESSION / MDM / ASSESSMENT AND PLAN / ED COURSE  I reviewed the triage vital signs and the nursing notes.   Differential diagnosis includes, but is not limited to, weakness due to hyponatremia, dehydration, infection.  Patient presented to the ER because of weakness and hyponatremia seen on outpatient lab work. Reviewed outpatient lab work. Na of 122. Patient has had issues with hyponatremia recently. COVID/influenza negative. Started IV fluids here. Will discuss with hospitalist for admission. Discussed plan with patient and family.   FINAL CLINICAL IMPRESSION(S) / ED DIAGNOSES   Final diagnoses:  Hyponatremia  Weakness      Note:  This document was prepared using Dragon voice recognition software and may include unintentional dictation errors.    Nance Pear, MD 11/03/21 2120

## 2021-11-03 NOTE — H&P (Addendum)
History and Physical   Paul Carpenter ZOX:[id number] DOB: 1956-04-22 DOA: 11/03/2021  PCP: Kirk Ruths, MD  Outpatient Specialists: Dr. Grayland Ormond, medical oncology Patient coming from: Oncology clinic  I have personally briefly reviewed patient's old medical records in [place].  Chief Concern: Weakness and hyponatremia  HPI: Paul Carpenter is a 66 y.o. male with medical history significant for COPD, hypertension, current tobacco use, hyperlipidemia, anxiety, depression, OSA with CPAP noncompliance, Raynaud's disease, left-sided non-small cell carcinoma status post radiation and completed chemotherapy in 2019.  Patient presented from [facility] clinic oncology for chief concerns of hyponatremia.  At bedside patient is able to tell me his name, his age, the current location is hospital.  He states that for the past 2 to 3 months he has been feeling increasingly weak.  He does not even walk anymore.  He states that if he walks he always has to walk with a walker now.  He denies any chest pain, abdominal pain, nausea, vomiting, diarrhea, dysuria.  He states he has not been seen by nephrologist yet and is still trying to make an appointment.  He states that when he was in the hospital last time he thought that he was okay to go home, he was not intentionally leaving [place].  He does endorse mild shortness of breath with exertion.  He reports unintentional eight -10 pound weight loss in the last 2 to 3 months.  Social history: He lives at home with his wife.  He currently smokes 3 packs/day.  He drinks EtOH about 2 weeks drinks per week.  He denies recreational drug use.  He still currently works part-time.  He owns his own company, [employer].  Vaccination history: He has received 2 doses of COVID-vaccine  ROS: Constitutional: + weight change, no fever ENT/Mouth: no sore throat, no rhinorrhea Eyes: no eye pain, no vision changes Cardiovascular: no  chest pain, + dyspnea,  no edema, no palpitations Respiratory: no cough, no sputum, no wheezing Gastrointestinal: no nausea, no vomiting, no diarrhea, no constipation Genitourinary: no urinary incontinence, no dysuria, no hematuria Musculoskeletal: no arthralgias, no myalgias Skin: no skin lesions, no pruritus, Neuro: + weakness, no loss of consciousness, no syncope Psych: no anxiety, no depression, + decrease appetite Heme/Lymph: no bruising, no bleeding  ED Course: Discussed with emergency medicine provider, patient requiring hospitalization for chief concerns of hyponatremia.  Vitals in the emergency department showed temperature of 98.8, respiration rate of 16, heart rate of 108, blood pressure 110/70, SPO2 of 99% on room air.  Serum sodium 122, potassium 4.1, chloride of 85, bicarb 25, BUN of 13, serum creatinine of 0.85, nonfasting blood glucose 150, GFR greater than 60, WBC 10.6, hemoglobin 10.1, platelets 366.  COVID/influenza A/influenza B PCR were negative.  UA was negative for leukocytes and nitrates.  In the emergency department patient was given sodium chloride 250 mL bolus.  Assessment/Plan  Principal Problem:   Hyponatremia Active Problems:   COPD (chronic obstructive pulmonary disease) (HCC)   Tobacco use disorder   Obstructive sleep apnea   Personal history of tobacco use, presenting hazards to health   Leukocytosis   Anxiety   Benign essential hypertension   Mixed hyperlipidemia   SIADH (syndrome of inappropriate ADH production) (HCC)   Overweight (BMI 25.0-29.9)   # Weakness presumed secondary to chronic hyponatremia, in setting of non-small cell carcinoma # Hyponatremia-patient has diagnosis of SIADH - Hospitalization from 09/21/2021 to 09/22/2021: For hyponatremia thought to be secondary to  SIADH.  Patient was being followed by nephrology with plan for repeat dosing of [place] but decided to leave the hospital - Sodium chloride IVF at 150 mL/h for 8 hours  ordered - Resumed home salt tablets - Staff message sent to Dr. Candiss Norse and epic order placed for nephrology consultation  # Left-sided non-small cell carcinoma stage IIIa - Continue follow-up with outpatient oncologist  # Neck pain-patient had a fall on 10/28/2021 with a C2 acute fracture - C-collar in place - Follow-up with outpatient neurosurgery - Patient has been taken Tylenol at home for pain control with minimal improvement - Acetaminophen 650 mg, ketorolac, morphine 0.5 mg IV ordered for pain control  # COPD  # Hypothyroid-patient takes levothyroxine 150 mcg daily  # Hyperlipidemia-atorvastatin 40 mg nightly  # Anxiety-resumed home alprazolam 0.25 mg p.o. twice daily  # Hyperlipidemia atorvastatin 40 mg nightly resumed  # History of heart failure with reduced ejection fraction  # OSA-CPAP noncompliance  # GERD-PPI  # Tobacco dependence-nicotine patch ordered  PT, OT consulted  Chart reviewed.   DVT prophylaxis: Enoxaparin 40 mg subcutaneous Code Status: Full code Diet: Heart healthy Family Communication: No Disposition Plan: Pending clinical course, anticipate less than 2 midnight stay Consults called: Staff message sent to nephrology, PT, OT Admission status: Telemetry medical, observation  Past Medical History:  Diagnosis Date   Anginal pain ([place])    Anxiety    Asthma    Chest pain    CHF (congestive heart failure) (HCC)    Chicken pox    Complication of anesthesia    o2 dropped after neck fusion   COPD (chronic obstructive pulmonary disease) ([place])    Coronary artery disease    Cough    chronic  clear phlegm   Dysrhythmia    palpitations   GERD (gastroesophageal reflux disease)    h/o reflux/ hoarsness   Hematochezia    Hemorrhoids    History of chickenpox    History of colon polyps    History of Helicobacter pylori infection    Hoarseness    Hypertension    Lung cancer ([place]) 05/2016   Chemo + rad tx's.    Migraines    OSA (obstructive  sleep apnea)    has CPAP but does not use   Personal history of tobacco use, presenting hazards to health 03/05/2016   Pneumonia    5/17   Raynaud disease    Raynaud disease    Raynaud's disease    Rotator cuff tear    on right   Shortness of breath dyspnea    Sleep apnea    Ulcer (traumatic) of oral mucosa    Past Surgical History:  Procedure Laterality Date   BACK SURGERY     cervical fusion x 2   CARDIAC CATHETERIZATION     CERVICAL DISCECTOMY     x 2   COLONOSCOPY     COLONOSCOPY N/A 07/25/2015   Procedure: COLONOSCOPY;  Surgeon: Lollie Sails, MD;  Location[facility] ENDOSCOPY;  Service: Endoscopy;  Laterality: N/A;   COLONOSCOPY WITH PROPOFOL N/A 10/04/2017   Procedure: COLONOSCOPY WITH PROPOFOL;  Surgeon: Lollie Sails, MD;  Location: [facility] ENDOSCOPY;  Service: Endoscopy;  Laterality: N/A;   COLONOSCOPY WITH PROPOFOL N/A 07/10/2020   Procedure: COLONOSCOPY WITH PROPOFOL;  Surgeon: Toledo, Benay Pike, MD;  Location: [facility] ENDOSCOPY;  Service: Gastroenterology;  Laterality: N/A;   ELECTROMAGNETIC NAVIGATION BROCHOSCOPY Left 06/28/2016   Procedure: ELECTROMAGNETIC NAVIGATION BRONCHOSCOPY;  Surgeon: Vilinda Boehringer, MD;  Location: [facility]  ORS;  Service: Cardiopulmonary;  Laterality: Left;   ENDOBRONCHIAL ULTRASOUND N/A 04/11/2018   Procedure: ENDOBRONCHIAL ULTRASOUND;  Surgeon: [name], MD;  Location: [facility] ORS;  Service: Cardiopulmonary;  Laterality: N/A;   ESOPHAGOGASTRODUODENOSCOPY N/A 07/25/2015   Procedure: ESOPHAGOGASTRODUODENOSCOPY (EGD);  Surgeon: Lollie Sails, MD;  Location: [facility] ENDOSCOPY;  Service: Endoscopy;  Laterality: N/A;   ESOPHAGOGASTRODUODENOSCOPY (EGD) WITH PROPOFOL N/A 07/10/2020   Procedure: ESOPHAGOGASTRODUODENOSCOPY (EGD) WITH PROPOFOL;  Surgeon: Toledo, Benay Pike, MD;  Location: [facility] ENDOSCOPY;  Service: Gastroenterology;  Laterality: N/A;   NASAL SINUS SURGERY     x 2    PORTA CATH INSERTION N/A 04/24/2018   Procedure: PORTA CATH INSERTION;  Surgeon: Algernon Huxley, MD;  Location: [facility] CV LAB;  Service: Cardiovascular;  Laterality: N/A;   rotator cuff surgery Right    07/2016   SEPTOPLASTY     SKIN GRAFT     Social History:  reports that he has been smoking cigarettes. He has a 135.00 pack-year smoking history. He has never used smokeless tobacco. He reports current alcohol use of about 2.0 standard drinks per week. He reports that he does not use drugs.  Allergies  Allergen Reactions   Lisinopril Rash   Varenicline Rash   Family History  Problem Relation Age of Onset   Heart disease Father    Prostate cancer Father    Heart disease Paternal Grandmother    Heart attack Maternal Grandfather 70   Kidney cancer Neg Hx    Bladder Cancer Neg Hx    Other Neg Hx        pituitary abnormality   Family history: Family history reviewed and not pertinent.  Prior to Admission medications   Medication Sig Start Date End Date Taking? Authorizing Provider  acetaminophen (TYLENOL) 500 MG tablet Take 500 mg by mouth daily.    [provider]  albuterol (PROAIR HFA) 108 (90 Base) MCG/ACT inhaler Inhale 2 puffs into the lungs every 4 (four) hours as needed for wheezing or shortness of breath. 08/23/18   Jacquelin Hawking, NP  ALPRAZolam Duanne Moron) 0.25 MG tablet Take 0.25 mg by mouth 2 (two) times daily as needed. 08/14/21   [provider]  atorvastatin (LIPITOR) 40 MG tablet Take 40 mg by mouth at bedtime.  03/30/13   [provider]  DULoxetine (CYMBALTA) 60 MG capsule Take 60 mg by mouth daily. 10/27/18   [provider]  ferrous sulfate 325 (65 FE) MG tablet Take 325 mg by mouth daily with breakfast.    [provider]  Fluticasone-Umeclidin-Vilant (TRELEGY ELLIPTA) 200-62.5-25 MCG/INH AEPB Inhale 200 mcg into the lungs daily. 04/21/21   [name], MD  ipratropium-albuterol (DUONEB) 0.5-2.5 (3) MG/3ML SOLN Take 3 mLs by nebulization every 6 (six) hours. 07/18/18   [name], MD  levothyroxine  (SYNTHROID) 150 MCG tablet Take 150 mcg by mouth daily before breakfast.    [provider]  lidocaine-prilocaine (EMLA) cream Apply 1 application topically as needed. 09/30/21   [name], MD  magnesium oxide (MAG-OX) 400 MG tablet Take 800 mg by mouth daily. Takes 800 mg qd    [provider]  metoprolol succinate (TOPROL-XL) 25 MG 24 hr tablet Take 25 mg by mouth daily. 03/06/19   [provider]  Multiple Vitamin (MULTIVITAMIN WITH MINERALS) TABS tablet Take 1 tablet by mouth daily.     [provider]  ondansetron (ZOFRAN) 8 MG tablet Take 1 tablet (8 mg total) by mouth every 8 (eight)  hours as needed for nausea or vomiting. 05/26/21   [name], MD  OXYGEN Inhale 2 L into the lungs at bedtime. Patient not taking: Reported on 10/15/2021    [provider]  pantoprazole (PROTONIX) 40 MG tablet Take 40 mg by mouth 2 (two) times daily. 01/05/21   [provider]  potassium chloride (K-DUR) 10 MEQ tablet Take 10 mEq by mouth daily. 06/12/18   [provider]  predniSONE (DELTASONE) 20 MG tablet Take 40 mg by mouth daily. Patient not taking: Reported on 10/15/2021 09/18/21   [provider]  sodium chloride 1 g tablet Take 1 tablet (1 g total) by mouth 3 (three) times daily with meals. 09/22/21   Annita Brod, MD  tadalafil (CIALIS) 5 MG tablet Take 1 tablet (5 mg total) by mouth daily as needed for erectile dysfunction. Patient not taking: Reported on 10/15/2021 10/15/21   Zara Council A, PA-C  torsemide (DEMADEX) 10 MG tablet Take 1 tablet (10 mg total) by mouth daily. Patient taking differently: Take 10 mg by mouth daily. Taking 1/2 tab qd 09/22/21   Annita Brod, MD  prochlorperazine (COMPAZINE) 10 MG tablet Take 1 tablet (10 mg total) by mouth every 6 (six) hours as needed (Nausea or vomiting). 04/28/18 08/23/18  [name], MD   Physical Exam: Vitals:   11/03/21 2000 11/03/21 2100  11/03/21 2200 11/03/21 2330  BP: 125/66 117/72 117/72 133/76  Pulse: (!) 101 96 98 95  Resp: 18 20  18   Temp:    97.6 F (36.4 C)  TempSrc:    Oral  SpO2: 98% 96% 96% 99%  Weight:      Height:       Constitutional: appears age-appropriate, frail, NAD, calm, comfortable Eyes: PERRL, lids and conjunctivae normal ENMT: Mucous membranes are moist. Posterior pharynx clear of any exudate or lesions. Age-appropriate dentition. Hearing appropriate. Neck: normal, supple, no masses, no thyromegaly.  C-collar in place. Respiratory: clear to auscultation bilaterally, no wheezing, no crackles. Normal respiratory effort. No accessory muscle use.  Cardiovascular: Regular rate and rhythm, no murmurs / rubs / gallops. No extremity edema. 2+ pedal pulses. No carotid bruits.  Abdomen: no tenderness, no masses palpated, no hepatosplenomegaly. Bowel sounds positive.  Musculoskeletal: no clubbing / cyanosis. No joint deformity upper and lower extremities. Good ROM, no contractures, no atrophy. Normal muscle tone.  Skin: no rashes, lesions, ulcers. No induration Neurologic: Sensation intact. Strength 5/5 in all 4.  Psychiatric: Normal judgment and insight. Alert and oriented x 3. Normal mood.   EKG: Not indicated at this time  Chest x-ray on Admission: I personally reviewed and I agree with radiologist reading as below.  DG Chest Port 1 View  Result Date: 11/03/2021 CLINICAL DATA:  Increasing weakness, lethargy, history of right-sided non-small cell lung cancer EXAM: PORTABLE CHEST 1 VIEW COMPARISON:  09/21/2021, 07/20/2021 FINDINGS: Single frontal view of the chest demonstrates stable right chest wall port. Cardiac silhouette is unremarkable. There is progressive consolidation within the right upper lobe, with areas of central cavitation. Increased interstitial prominence throughout the right lung. Stable background emphysema. No effusion or pneumothorax. No acute bony abnormality. IMPRESSION: 1. Progressive  right upper lobe consolidation with central cavitation, with asymmetric increased right-sided interstitial prominence. Overall, findings are concerning for progression of malignancy, with possible lymphangitic spread of disease. Cavitating infection or post radiation change could also be considered. Electronically Signed   By: Randa Ngo M.D.   On: 11/03/2021 22:09    Labs  on Admission: I have personally reviewed following labs  CBC: Recent Labs  Lab 11/03/21 1457  WBC 10.6*  NEUTROABS 9.2*  HGB 10.1*  HCT 29.5*  MCV 90.2  PLT 833   Basic Metabolic Panel: Recent Labs  Lab 11/03/21 1457 11/03/21 2325  NA 122*  --   K 4.1  --   CL 85*  --   CO2 25  --   GLUCOSE 150*  --   BUN 13  --   CREATININE 0.85  --   CALCIUM 8.7*  --   MG 1.8 2.0  PHOS  --  4.2   GFR: Estimated Creatinine Clearance: 96.4 mL/min (by C-G formula based on SCr of 0.85 mg/dL).  Liver Function Tests: Recent Labs  Lab 11/03/21 1457  AST 23  ALT 29  ALKPHOS 105  BILITOT 0.2*  PROT 6.8  ALBUMIN 2.9*   Anemia Panel: Recent Labs    11/03/21 1457  FERRITIN 662*  TIBC 224*  IRON 20*   Urine analysis:    Component Value Date/Time   COLORURINE YELLOW (A) 11/03/2021 2106   APPEARANCEUR CLEAR (A) 11/03/2021 2106   APPEARANCEUR Clear 08/15/2020 0951   LABSPEC 1.008 11/03/2021 2106   LABSPEC 1.004 01/26/2012 0944   PHURINE 5.0 11/03/2021 2106   GLUCOSEU NEGATIVE 11/03/2021 2106   GLUCOSEU Negative 01/26/2012 0944   HGBUR NEGATIVE 11/03/2021 2106   BILIRUBINUR NEGATIVE 11/03/2021 2106   BILIRUBINUR Negative 08/15/2020 0951   BILIRUBINUR Negative 01/26/2012 0944   KETONESUR NEGATIVE 11/03/2021 2106   PROTEINUR NEGATIVE 11/03/2021 2106   NITRITE NEGATIVE 11/03/2021 2106   LEUKOCYTESUR NEGATIVE 11/03/2021 2106   LEUKOCYTESUR Negative 01/26/2012 0944   Dr. Tobie Poet [facility]  If 7PM-7AM, please contact overnight-coverage provider If 7AM-7PM, please contact day coverage  provider [email or web address]  11/04/2021, 12:37 AM

## 2021-11-03 NOTE — Progress Notes (Signed)
Symptom Management [place] at [facility] [phone] [phone]  Patient Care Team: Kirk Ruths, MD as PCP - General (Internal Medicine) [name], MD as Referring Physician (Vascular Surgery) [name], MD as Consulting Physician (Oncology) [name], MD as Referring Physician (Radiation Oncology)   Name of the patient: Paul Carpenter  [id number]  03-Feb-1956   Date of visit: 11/03/21  Reason for Consult:  Paul Carpenter is a 66 year old man with multiple medical problems including COPD with noncompliance with O2, sleep apnea with noncompliance with CPAP, and recurrent stage IIIa non-small cell lung cancer who is status post XRT.  Patient has had chronic weakness and fatigue.  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  Patient was being followed by nephrology with plan for repeat dosing of Samsca but decided to leave the hospital.  Patient subsequently had follow-up with Dr. Grayland Ormond with plan for repeat imaging in February.  Patient was seen in the ER at [facility] on 10/28/2021 after having a fall.  Patient was found to have an endplate C2 fracture and was placed in c-collar by neurosurgery.  Patient presents to [facility] today with progressive weakness and recurrent falls.  Appetite has been poor.  Patient reportedly has had declining performance status over the past several weeks.  He is now ambulating with use of a walker.  He has had recurrent syncopal episodes when he stands from a sitting position.  Patient reports that he recently had an extensive cardiac work-up at [facility].  He is pending nephrology consultation next week.  Denies any neurologic complaints. Denies recent fevers or illnesses. Denies any easy bleeding or bruising. Denies chest pain.  He has chronic shortness of breath.  Denies any nausea, vomiting, constipation, or diarrhea. Denies urinary  complaints. Patient offers no further specific complaints today.    PAST MEDICAL HISTORY: Past Medical History:  Diagnosis Date   Anginal pain ([place])    Anxiety    Asthma    Chest pain    CHF (congestive heart failure) (HCC)    Chicken pox    Complication of anesthesia    o2 dropped after neck fusion   COPD (chronic obstructive pulmonary disease) (HCC)    Coronary artery disease    Cough    chronic  clear phlegm   Dysrhythmia    palpitations   GERD (gastroesophageal reflux disease)    h/o reflux/ hoarsness   Hematochezia    Hemorrhoids    History of chickenpox    History of colon polyps    History of Helicobacter pylori infection    Hoarseness    Hypertension    Lung cancer ([place]) 05/2016   Chemo + rad tx's.    Migraines    OSA (obstructive sleep apnea)    has CPAP but does not use   Personal history of tobacco use, presenting hazards to health 03/05/2016   Pneumonia    5/17   Raynaud disease    Raynaud disease    Raynaud's disease    Rotator cuff tear    on right   Shortness of breath dyspnea    Sleep apnea    Ulcer (traumatic) of oral mucosa     PAST SURGICAL HISTORY:  Past Surgical History:  Procedure Laterality Date   BACK SURGERY     cervical fusion x 2   CARDIAC CATHETERIZATION     CERVICAL DISCECTOMY     x 2   COLONOSCOPY  COLONOSCOPY N/A 07/25/2015   Procedure: COLONOSCOPY;  Surgeon: Lollie Sails, MD;  Location: [facility] ENDOSCOPY;  Service: Endoscopy;  Laterality: N/A;   COLONOSCOPY WITH PROPOFOL N/A 10/04/2017   Procedure: COLONOSCOPY WITH PROPOFOL;  Surgeon: Lollie Sails, MD;  Location: [facility] ENDOSCOPY;  Service: Endoscopy;  Laterality: N/A;   COLONOSCOPY WITH PROPOFOL N/A 07/10/2020   Procedure: COLONOSCOPY WITH PROPOFOL;  Surgeon: Toledo, Benay Pike, MD;  Location: [facility] ENDOSCOPY;  Service: Gastroenterology;  Laterality: N/A;   ELECTROMAGNETIC NAVIGATION BROCHOSCOPY Left 06/28/2016   Procedure: ELECTROMAGNETIC NAVIGATION BRONCHOSCOPY;   Surgeon: Vilinda Boehringer, MD;  Location: [facility] ORS;  Service: Cardiopulmonary;  Laterality: Left;   ENDOBRONCHIAL ULTRASOUND N/A 04/11/2018   Procedure: ENDOBRONCHIAL ULTRASOUND;  Surgeon: [name], MD;  Location: [facility] ORS;  Service: Cardiopulmonary;  Laterality: N/A;   ESOPHAGOGASTRODUODENOSCOPY N/A 07/25/2015   Procedure: ESOPHAGOGASTRODUODENOSCOPY (EGD);  Surgeon: Lollie Sails, MD;  Location: [facility] ENDOSCOPY;  Service: Endoscopy;  Laterality: N/A;   ESOPHAGOGASTRODUODENOSCOPY (EGD) WITH PROPOFOL N/A 07/10/2020   Procedure: ESOPHAGOGASTRODUODENOSCOPY (EGD) WITH PROPOFOL;  Surgeon: Toledo, Benay Pike, MD;  Location: [facility] ENDOSCOPY;  Service: Gastroenterology;  Laterality: N/A;   NASAL SINUS SURGERY     x 2    PORTA CATH INSERTION N/A 04/24/2018   Procedure: PORTA CATH INSERTION;  Surgeon: Algernon Huxley, MD;  Location: [place] CV LAB;  Service: Cardiovascular;  Laterality: N/A;   rotator cuff surgery Right    07/2016   SEPTOPLASTY     SKIN GRAFT      HEMATOLOGY/ONCOLOGY HISTORY:  Oncology History  Non-small cell lung cancer, right ([place])  04/24/2018 Initial Diagnosis   Non-small cell lung cancer, right ([place])   04/28/2018 Cancer Staging   Staging form: Lung, AJCC 8th Edition - Clinical stage from 04/28/2018: Stage IIIA (cT1b, cN2, cM0) - Signed by [name], MD on 04/28/2018    05/03/2018 - 06/21/2018 Chemotherapy   The patient had palonosetron (ALOXI) injection 0.25 mg, 0.25 mg, Intravenous,  Once, 8 of 8 cycles Administration: 0.25 mg (05/03/2018), 0.25 mg (05/11/2018), 0.25 mg (06/07/2018), 0.25 mg (06/14/2018), 0.25 mg (06/21/2018), 0.25 mg (05/17/2018), 0.25 mg (05/24/2018), 0.25 mg (05/31/2018) CARBOplatin (PARAPLATIN) 300 mg in sodium chloride 0.9 % 100 mL chemo infusion, 300 mg (100 % of original dose 299.4 mg), Intravenous,  Once, 8 of 8 cycles Dose modification:   (original dose 299.4 mg, Cycle 1) Administration: 300 mg (05/11/2018), 300 mg (06/07/2018), 300 mg (06/14/2018), 300 mg  (06/21/2018), 300 mg (05/17/2018), 300 mg (05/24/2018), 300 mg (05/31/2018) PACLitaxel (TAXOL) 96 mg in sodium chloride 0.9 % 250 mL chemo infusion (</= 80mg /m2), 45 mg/m2 = 96 mg, Intravenous,  Once, 1 of 1 cycle Administration: 96 mg (05/03/2018)   for chemotherapy treatment.     07/12/2018 -  Chemotherapy   The patient had durvalumab (IMFINZI) 1,000 mg in sodium chloride 0.9 % 100 mL chemo infusion, 920 mg, Intravenous,  Once, 26 of 26 cycles Administration: 1,000 mg (07/12/2018), 1,000 mg (07/26/2018), 1,000 mg (08/09/2018), 1,000 mg (08/23/2018), 1,000 mg (09/06/2018), 1,000 mg (09/20/2018), 1,000 mg (10/04/2018), 1,000 mg (10/18/2018), 1,000 mg (11/02/2018), 1,000 mg (11/15/2018), 1,000 mg (11/29/2018), 1,000 mg (12/13/2018), 1,000 mg (12/27/2018), 1,000 mg (01/10/2019), 1,000 mg (01/24/2019), 1,000 mg (02/07/2019), 1,000 mg (02/21/2019), 1,000 mg (03/07/2019), 1,000 mg (03/21/2019), 1,000 mg (04/04/2019), 1,000 mg (04/18/2019), 1,000 mg (05/02/2019), 1,000 mg (05/16/2019), 1,000 mg (05/30/2019), 1,000 mg (06/13/2019), 1,000 mg (06/27/2019)   for chemotherapy treatment.       ALLERGIES:  is allergic to lisinopril and varenicline.  MEDICATIONS:  Current Outpatient Medications  Medication Sig Dispense Refill   acetaminophen (TYLENOL) 500 MG tablet Take 500 mg by mouth daily.     albuterol (PROAIR HFA) 108 (90 Base) MCG/ACT inhaler Inhale 2 puffs into the lungs every 4 (four) hours as needed for wheezing or shortness of breath. 1 Inhaler 1   ALPRAZolam (XANAX) 0.25 MG tablet Take 0.25 mg by mouth 2 (two) times daily as needed.     atorvastatin (LIPITOR) 40 MG tablet Take 40 mg by mouth at bedtime.      DULoxetine (CYMBALTA) 60 MG capsule Take 60 mg by mouth daily.     ferrous sulfate 325 (65 FE) MG tablet Take 325 mg by mouth daily with breakfast.     Fluticasone-Umeclidin-Vilant (TRELEGY ELLIPTA) 200-62.5-25 MCG/INH AEPB Inhale 200 mcg into the lungs daily. 28 each 0   ipratropium-albuterol (DUONEB) 0.5-2.5 (3) MG/3ML  SOLN Take 3 mLs by nebulization every 6 (six) hours. 360 mL 5   levothyroxine (SYNTHROID) 150 MCG tablet Take 150 mcg by mouth daily before breakfast.     magnesium oxide (MAG-OX) 400 MG tablet Take 800 mg by mouth daily. Takes 800 mg qd     metoprolol succinate (TOPROL-XL) 25 MG 24 hr tablet Take 25 mg by mouth daily.     Multiple Vitamin (MULTIVITAMIN WITH MINERALS) TABS tablet Take 1 tablet by mouth daily.      ondansetron (ZOFRAN) 8 MG tablet Take 1 tablet (8 mg total) by mouth every 8 (eight) hours as needed for nausea or vomiting. 30 tablet 1   pantoprazole (PROTONIX) 40 MG tablet Take 40 mg by mouth 2 (two) times daily.     potassium chloride (K-DUR) 10 MEQ tablet Take 10 mEq by mouth daily.  3   sodium chloride 1 g tablet Take 1 tablet (1 g total) by mouth 3 (three) times daily with meals. 90 tablet 1   torsemide (DEMADEX) 10 MG tablet Take 1 tablet (10 mg total) by mouth daily. (Patient taking differently: Take 10 mg by mouth daily. Taking 1/2 tab qd) 30 tablet 1   lidocaine-prilocaine (EMLA) cream Apply 1 application topically as needed. 30 g 1   OXYGEN Inhale 2 L into the lungs at bedtime. (Patient not taking: Reported on 10/15/2021)     predniSONE (DELTASONE) 20 MG tablet Take 40 mg by mouth daily. (Patient not taking: Reported on 10/15/2021)     tadalafil (CIALIS) 5 MG tablet Take 1 tablet (5 mg total) by mouth daily as needed for erectile dysfunction. (Patient not taking: Reported on 10/15/2021) 90 tablet 3   No current facility-administered medications for this visit.    VITAL SIGNS: BP (!) 87/4 (Patient Position: Standing)    Pulse (!) 108    Temp 98.8 F (37.1 C) (Tympanic)    Resp 16    Wt 189 lb (85.7 kg)    SpO2 99%    BMI 25.63 kg/m  Filed Weights   11/03/21 1508  Weight: 189 lb (85.7 kg)    Estimated body mass index is 25.63 kg/m as calculated from the following:   Height as of 10/15/21: 6' (1.829 m).   Weight as of this encounter: 189 lb (85.7 kg).  LABS: CBC:     Component Value Date/Time   WBC 10.6 (H) 11/03/2021 1457   HGB 10.1 (L) 11/03/2021 1457   HGB 13.4 12/14/2018 1040   HCT 29.5 (L) 11/03/2021 1457   HCT 37.1 (L) 12/14/2018 1040   PLT 366 11/03/2021 1457   PLT 228  07/08/2017 0807   MCV 90.2 11/03/2021 1457   MCV 93 07/08/2017 0807   MCV 97 06/03/2014 1236   NEUTROABS 9.2 (H) 11/03/2021 1457   NEUTROABS 6.0 07/08/2017 0807   NEUTROABS 9.1 (H) 06/03/2014 1236   LYMPHSABS 0.5 (L) 11/03/2021 1457   LYMPHSABS 1.8 07/08/2017 0807   LYMPHSABS 2.2 06/03/2014 1236   MONOABS 0.8 11/03/2021 1457   MONOABS 0.8 06/03/2014 1236   EOSABS 0.1 11/03/2021 1457   EOSABS 0.1 07/08/2017 0807   EOSABS 0.2 06/03/2014 1236   BASOSABS 0.1 11/03/2021 1457   BASOSABS 0.0 07/08/2017 0807   BASOSABS 0.2 (H) 06/03/2014 1236   Comprehensive Metabolic Panel:    Component Value Date/Time   NA 123 (L) 10/15/2021 1513   NA 136 12/25/2012 0951   K 4.4 10/15/2021 1513   K 4.0 12/25/2012 0951   CL 84 (L) 10/15/2021 1513   CL 102 12/25/2012 0951   CO2 27 10/15/2021 1513   CO2 31 12/25/2012 0951   BUN 10 10/15/2021 1513   BUN 10 12/25/2012 0951   CREATININE 0.73 10/15/2021 1513   CREATININE 0.88 12/25/2012 0951   GLUCOSE 103 (H) 10/15/2021 1513   GLUCOSE 86 12/25/2012 0951   CALCIUM 8.8 (L) 10/15/2021 1513   CALCIUM 8.9 12/25/2012 0951   AST 34 10/15/2021 1513   ALT 51 (H) 10/15/2021 1513   ALKPHOS 99 10/15/2021 1513   BILITOT 0.6 10/15/2021 1513   PROT 7.1 10/15/2021 1513   ALBUMIN 3.2 (L) 10/15/2021 1513    RADIOGRAPHIC STUDIES: No results found.  PERFORMANCE STATUS (ECOG) : 3 - Symptomatic, >50% confined to bed  Review of Systems Unless otherwise noted, a complete review of systems is negative.  Physical Exam General: NAD Cardiovascular: regular rate and rhythm Pulmonary: clear ant fields Abdomen: soft, nontender, + bowel sounds GU: no suprapubic tenderness Extremities: no edema, no joint deformities Skin: no rashes Neurological:  Weakness but otherwise nonfocal  Assessment and Plan- Patient is a 66 y.o. male with multiple medical problems including COPD with noncompliance with O2, sleep apnea with noncompliance with CPAP, and recurrent stage IIIa non-small cell lung cancer who is status post XRT.  Patient has had chronic weakness and fatigue.  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  Patient was being followed by nephrology with plan for repeat dosing of Samsca but decided to leave the hospital.   Hyponatremia -Case discussed with Dr. Grayland Ormond.  Had a lengthy meeting with patient/daughter/wife/two sons.  Family is concerned that patient is declining.  He is orthostatic today and likely needs gentle fluids and trending of labs.  SIADH is also likely contributing to hyponatremia.  Recommended ER for further evaluation/management.  Patient and family are in agreement with plan.  Report called to ER triage nurse.  Lung mass -family report that patient was told at [facility] there was a new lung mass.  Outside images are currently pending radiology review.   Patient expressed understanding and was in agreement with this plan. He also understands that He can call clinic at any time with any questions, concerns, or complaints.   Thank you for allowing me to participate in the care of this very pleasant patient.   Time Total: 45 minutes  Visit consisted of counseling and education dealing with the complex and emotionally intense issues of symptom management in the setting of serious illness.Greater than 50%  of this time was spent counseling and coordinating care related to the above assessment and plan.  Signed by: Vonna Kotyk  Paul Hughett, PhD, NP-C

## 2021-11-03 NOTE — ED Triage Notes (Signed)
Pt sent from [place] center with Na+122

## 2021-11-03 NOTE — Telephone Encounter (Signed)
Daughter called reporting that patient is getting worse daily, he is so weak that his legs can hardly hold him up and he is having to use a walker to get around. He is also blacking out every time he gets up. He has no appetite and is not eating. She said that no one seems to be interested in finding the cause of this. She reports that his Lymphocyte count was low when at [facility] last week. She was wanting to know if we had the comparison from the CT at [facility] last week,(we do not have that as I had to call radiology who connected me to IT who is in charge of pushing the images through to our system and they are working on that now.. Daughter is concerned that patient is slowly dying and wants something done. He does not have follow up with medical Onc until March.Please advise.   CBC w/ Differential Order: [id number]  Ref Range & Units 6 d ago  WBC 3.6 - 11.2 10*9/L 9.2   RBC 4.26 - 5.60 10*12/L 3.21 Low    HGB 12.9 - 16.5 g/dL 9.8 Low    HCT 39.0 - 48.0 % 29.5 Low    MCV 77.6 - 95.7 fL 91.7   MCH 25.9 - 32.4 pg 30.4   MCHC 32.0 - 36.0 g/dL 33.1   RDW 12.2 - 15.2 % 14.6   MPV 6.8 - 10.7 fL 5.7 Low    Platelet 150 - 450 10*9/L 384   Neutrophils % % 84.8   Lymphocytes % % 4.2   Monocytes % % 9.3   Eosinophils % % 0.9   Basophils % % 0.8   Absolute Neutrophils 1.8 - 7.8 10*9/L 7.8   Absolute Lymphocytes 1.1 - 3.6 10*9/L 0.4 Low    Absolute Monocytes 0.3 - 0.8 10*9/L 0.9 High    Absolute Eosinophils 0.0 - 0.5 10*9/L 0.1   Absolute Basophils 0.0 - 0.1 10*9/L 0.1   Resulting Agency  [facility]  Specimen Collected: 10/28/21 10:05 Last Resulted: 10/28/21 10:18  Received From: [facility]  Result Received: 10/30/21 10:55

## 2021-11-03 NOTE — Telephone Encounter (Signed)
Spoke with [employer]. Approximately 10-11 exams are being sent to [facility] but "it may take a while." Dr. Woodfin Ganja made aware of image transfer.

## 2021-11-03 NOTE — ED Notes (Signed)
Transport order put in

## 2021-11-03 NOTE — ED Triage Notes (Signed)
Pt to ED from cancer center for abnormal labs, Na +122.  Family reports AMS.  No active chemo

## 2021-11-03 NOTE — Telephone Encounter (Signed)
Called and spoke to pt's wife. She would like for patient to be seen today, if possible. Lab and [facility] appointments scheduled for today, as requested.

## 2021-11-04 ENCOUNTER — Encounter: Payer: Self-pay | Admitting: Internal Medicine

## 2021-11-04 DIAGNOSIS — E222 Syndrome of inappropriate secretion of antidiuretic hormone: Secondary | ICD-10-CM

## 2021-11-04 DIAGNOSIS — J432 Centrilobular emphysema: Secondary | ICD-10-CM | POA: Diagnosis not present

## 2021-11-04 DIAGNOSIS — E782 Mixed hyperlipidemia: Secondary | ICD-10-CM

## 2021-11-04 DIAGNOSIS — I10 Essential (primary) hypertension: Secondary | ICD-10-CM

## 2021-11-04 DIAGNOSIS — E871 Hypo-osmolality and hyponatremia: Secondary | ICD-10-CM | POA: Diagnosis not present

## 2021-11-04 LAB — BASIC METABOLIC PANEL
Anion gap: 9 (ref 5–15)
BUN: 10 mg/dL (ref 8–23)
CO2: 27 mmol/L (ref 22–32)
Calcium: 8.6 mg/dL — ABNORMAL LOW (ref 8.9–10.3)
Chloride: 90 mmol/L — ABNORMAL LOW (ref 98–111)
Creatinine, Ser: 0.67 mg/dL (ref 0.61–1.24)
GFR, Estimated: >60 mL/min (ref >60)
Glucose, Bld: 85 mg/dL (ref 70–99)
Potassium: 4.4 mmol/L (ref 3.5–5.1)
Sodium: 126 mmol/L — ABNORMAL LOW (ref 135–145)

## 2021-11-04 LAB — MAGNESIUM: Magnesium: 2 mg/dL (ref 1.7–2.4)

## 2021-11-04 LAB — SODIUM: Sodium: 126 mmol/L — ABNORMAL LOW (ref 135–145)

## 2021-11-04 LAB — CBC
HCT: 28.7 % — ABNORMAL LOW (ref 39.0–52.0)
Hemoglobin: 9.6 g/dL — ABNORMAL LOW (ref 13.0–17.0)
MCH: 30.3 pg (ref 26.0–34.0)
MCHC: 33.4 g/dL (ref 30.0–36.0)
MCV: 90.5 fL (ref 80.0–100.0)
Platelets: 328 10*3/uL (ref 150–400)
RBC: 3.17 MIL/uL — ABNORMAL LOW (ref 4.22–5.81)
RDW: 13.4 % (ref 11.5–15.5)
WBC: 7.5 10*3/uL (ref 4.0–10.5)
nRBC: 0 % (ref 0.0–0.2)

## 2021-11-04 LAB — TSH: TSH: 2.097 u[IU]/mL (ref 0.350–4.500)

## 2021-11-04 LAB — PHOSPHORUS: Phosphorus: 4.2 mg/dL (ref 2.5–4.6)

## 2021-11-04 MED ORDER — SODIUM CHLORIDE 1 G PO TABS
1.0000 g | ORAL_TABLET | Freq: Three times a day (TID) | ORAL | Status: DC
  Administered 2021-11-04 – 2021-11-05 (×6): 1 g via ORAL
  Filled 2021-11-04 (×6): qty 1

## 2021-11-04 MED ORDER — METOPROLOL SUCCINATE ER 25 MG PO TB24
12.5000 mg | ORAL_TABLET | Freq: Every day | ORAL | Status: DC
  Administered 2021-11-04 (×2): 12.5 mg via ORAL
  Filled 2021-11-04: qty 1
  Filled 2021-11-04: qty 0.5

## 2021-11-04 MED ORDER — IPRATROPIUM-ALBUTEROL 0.5-2.5 (3) MG/3ML IN SOLN
3.0000 mL | Freq: Four times a day (QID) | RESPIRATORY_TRACT | Status: DC
  Administered 2021-11-04: 08:00:00 3 mL via RESPIRATORY_TRACT
  Filled 2021-11-04: qty 3

## 2021-11-04 MED ORDER — LIDOCAINE-PRILOCAINE 2.5-2.5 % EX CREA
1.0000 "application " | TOPICAL_CREAM | CUTANEOUS | Status: DC | PRN
  Filled 2021-11-04: qty 5

## 2021-11-04 MED ORDER — FUROSEMIDE 20 MG PO TABS
10.0000 mg | ORAL_TABLET | Freq: Every day | ORAL | Status: DC
  Administered 2021-11-04 – 2021-11-05 (×2): 10 mg via ORAL
  Filled 2021-11-04 (×2): qty 1

## 2021-11-04 MED ORDER — BENZONATATE 100 MG PO CAPS
100.0000 mg | ORAL_CAPSULE | Freq: Three times a day (TID) | ORAL | Status: DC | PRN
  Administered 2021-11-04 – 2021-11-05 (×2): 100 mg via ORAL
  Filled 2021-11-04 (×2): qty 1

## 2021-11-04 MED ORDER — UMECLIDINIUM BROMIDE 62.5 MCG/ACT IN AEPB
1.0000 | INHALATION_SPRAY | Freq: Every day | RESPIRATORY_TRACT | Status: DC
  Administered 2021-11-04 – 2021-11-05 (×2): 1 via RESPIRATORY_TRACT
  Filled 2021-11-04: qty 7

## 2021-11-04 MED ORDER — SODIUM CHLORIDE 0.9 % IV SOLN: INTRAVENOUS | Status: AC

## 2021-11-04 MED ORDER — IPRATROPIUM-ALBUTEROL 0.5-2.5 (3) MG/3ML IN SOLN
3.0000 mL | Freq: Two times a day (BID) | RESPIRATORY_TRACT | Status: DC
  Administered 2021-11-04 – 2021-11-05 (×2): 3 mL via RESPIRATORY_TRACT
  Filled 2021-11-04 (×2): qty 3

## 2021-11-04 MED ORDER — MAGNESIUM OXIDE -MG SUPPLEMENT 400 (240 MG) MG PO TABS
600.0000 mg | ORAL_TABLET | Freq: Two times a day (BID) | ORAL | Status: DC
  Administered 2021-11-04 – 2021-11-05 (×4): 600 mg via ORAL
  Filled 2021-11-04: qty 1.5
  Filled 2021-11-04 (×3): qty 2

## 2021-11-04 MED ORDER — PANTOPRAZOLE SODIUM 40 MG PO TBEC
40.0000 mg | DELAYED_RELEASE_TABLET | Freq: Two times a day (BID) | ORAL | Status: DC
  Administered 2021-11-05: 40 mg via ORAL
  Filled 2021-11-04: qty 1

## 2021-11-04 MED ORDER — TRAZODONE HCL 50 MG PO TABS
100.0000 mg | ORAL_TABLET | Freq: Once | ORAL | Status: AC | PRN
  Administered 2021-11-04: 100 mg via ORAL
  Filled 2021-11-04: qty 2

## 2021-11-04 MED ORDER — FLUTICASONE FUROATE-VILANTEROL 200-25 MCG/ACT IN AEPB
1.0000 | INHALATION_SPRAY | Freq: Every day | RESPIRATORY_TRACT | Status: DC
  Administered 2021-11-04 – 2021-11-05 (×2): 1 via RESPIRATORY_TRACT
  Filled 2021-11-04: qty 28

## 2021-11-04 MED ORDER — FERROUS SULFATE 325 (65 FE) MG PO TABS
325.0000 mg | ORAL_TABLET | Freq: Two times a day (BID) | ORAL | Status: DC
  Administered 2021-11-04 – 2021-11-05 (×4): 325 mg via ORAL
  Filled 2021-11-04 (×3): qty 1

## 2021-11-04 MED ORDER — DULOXETINE HCL 20 MG PO CPEP
20.0000 mg | ORAL_CAPSULE | Freq: Every day | ORAL | Status: DC
  Administered 2021-11-04: 20 mg via ORAL
  Filled 2021-11-04 (×3): qty 1

## 2021-11-04 NOTE — Progress Notes (Addendum)
[facility]  ROUNDING NOTE   Subjective:   Paul Carpenter is a 66 year old male with medical conditions including hypertension, hyperlipidemia, depression and anxiety, COPD, and left-sided non-small cell carcinoma postradiation and completed chemotherapy.  Patient presents from [place] clinic due to hyponatremia.  Patient admitted under observation for Hyponatremia [E87.1] Weakness [R53.1]  Patient is known to our clinic through prior hospitalizations.  Patient states he generally feels bad 2 to 3 days prior to lab draw.  Denies nausea and vomiting but reports loss of appetite.  Unable to place on timeline.  Denies shortness of breath and cough.  Reports progressive fatigue.  Patient's son at bedside, reports recent weight loss of 10 pounds or more in past 2 to 3 months.  Patient reports recent fall while outside.  Patient continues to smoke, 3 packs/day.  Patient has completed radiation and chemotherapy in 2019.  Arrival labs consistent with sodium 122, glucose 150 calcium 8.7, WBCs 10.6.  Chest x-ray concerning for progression of malignancy.  CT body films concerning for solid nodules in the right upper lobe, enlarged right hilar lymph node, numerous small solid nodules throughout the right lung.  We have been consulted to evaluate hyponatremia   Objective:  Vital signs in last 24 hours:  Temp:  [97.6 F (36.4 C)-98.8 F (37.1 C)] 98.1 F (36.7 C) (01/04 1130) Pulse Rate:  [91-108] 91 (01/04 1130) Resp:  [16-20] 16 (01/04 1130) BP: (87-133)/(64-100) 128/79 (01/04 1130) SpO2:  [96 %-100 %] 97 % (01/04 1130) Weight:  [85.7 kg-86 kg] 86 kg (01/03 1729)  Weight change:  Filed Weights   11/03/21 1729  Weight: 86 kg    Intake/Output: I/O last 3 completed shifts: In: 343.2 [I.V.:208.2; IV Piggyback:135] Out: -    Intake/Output this shift:  Total I/O In: 240 [P.O.:240] Out: -   Physical Exam: General: NAD, resting in bed  Head: Normocephalic, atraumatic. Moist  oral mucosal membranes  Eyes: Anicteric  Neck: Soft neck collar  Lungs:  Clear to auscultation, normal effort, room air  Heart: Regular rate and rhythm  Abdomen:  Soft, nontender,   Extremities: No peripheral edema.  Neurologic: Nonfocal, moving all four extremities  Skin: No lesions       Basic Metabolic Panel: Recent Labs  Lab 11/03/21 1457 11/03/21 2325 11/04/21 0517  NA 122*  --  126*  K 4.1  --  4.4  CL 85*  --  90*  CO2 25  --  27  GLUCOSE 150*  --  85  BUN 13  --  10  CREATININE 0.85  --  0.67  CALCIUM 8.7*  --  8.6*  MG 1.8 2.0  --   PHOS  --  4.2  --     Liver Function Tests: Recent Labs  Lab 11/03/21 1457  AST 23  ALT 29  ALKPHOS 105  BILITOT 0.2*  PROT 6.8  ALBUMIN 2.9*   No results for input(s): LIPASE, AMYLASE in the last 168 hours. No results for input(s): AMMONIA in the last 168 hours.  CBC: Recent Labs  Lab 11/03/21 1457 11/04/21 0517  WBC 10.6* 7.5  NEUTROABS 9.2*  --   HGB 10.1* 9.6*  HCT 29.5* 28.7*  MCV 90.2 90.5  PLT 366 328    Cardiac Enzymes: No results for input(s): CKTOTAL, CKMB, CKMBINDEX, TROPONINI in the last 168 hours.  BNP: Invalid input(s): POCBNP  CBG: No results for input(s): GLUCAP in the last 168 hours.  Microbiology: Results for orders placed or performed during  the hospital encounter of 11/03/21  Resp Panel by RT-PCR (Flu A&B, Covid) Nasopharyngeal Swab     Status: None   Collection Time: 11/03/21  6:58 PM   Specimen: Nasopharyngeal Swab; Nasopharyngeal(NP) swabs in vial transport medium  Result Value Ref Range Status   SARS Coronavirus 2 by RT PCR NEGATIVE NEGATIVE Final    Comment: (NOTE) SARS-CoV-2 target nucleic acids are NOT DETECTED.  The SARS-CoV-2 RNA is generally detectable in upper respiratory specimens during the acute phase of infection. The lowest concentration of SARS-CoV-2 viral copies this assay can detect is 138 copies/mL. A negative result does not preclude SARS-Cov-2 infection and  should not be used as the sole basis for treatment or other patient management decisions. A negative result may occur with  improper specimen collection/handling, submission of specimen other than nasopharyngeal swab, presence of viral mutation(s) within the areas targeted by this assay, and inadequate number of viral copies(<138 copies/mL). A negative result must be combined with clinical observations, patient history, and epidemiological information. The expected result is Negative.  Fact Sheet for Patients:  EntrepreneurPulse.com.au  Fact Sheet for Healthcare Providers:  IncredibleEmployment.be  This test is no t yet approved or cleared by the [place] FDA and  has been authorized for detection and/or diagnosis of SARS-CoV-2 by FDA under an Emergency Use Authorization (EUA). This EUA will remain  in effect (meaning this test can be used) for the duration of the COVID-19 declaration under Section 564(b)(1) of the Act, 21 U.S.C.section 360bbb-3(b)(1), unless the authorization is terminated  or revoked sooner.       Influenza A by PCR NEGATIVE NEGATIVE Final   Influenza B by PCR NEGATIVE NEGATIVE Final    Comment: (NOTE) The Xpert Xpress SARS-CoV-2/FLU/RSV plus assay is intended as an aid in the diagnosis of influenza from Nasopharyngeal swab specimens and should not be used as a sole basis for treatment. Nasal washings and aspirates are unacceptable for Xpert Xpress SARS-CoV-2/FLU/RSV testing.  Fact Sheet for Patients: EntrepreneurPulse.com.au  Fact Sheet for Healthcare Providers: IncredibleEmployment.be  This test is not yet approved or cleared by the [place] FDA and has been authorized for detection and/or diagnosis of SARS-CoV-2 by FDA under an Emergency Use Authorization (EUA). This EUA will remain in effect (meaning this test can be used) for the duration of the COVID-19 declaration  under Section 564(b)(1) of the Act, 21 U.S.C. section 360bbb-3(b)(1), unless the authorization is terminated or revoked.  Performed at [facility], [address] [place], [place] [address]     Coagulation Studies: No results for input(s): LABPROT, INR in the last 72 hours.  Urinalysis: Recent Labs    11/03/21 2106  COLORURINE YELLOW*  LABSPEC 1.008  PHURINE 5.0  GLUCOSEU NEGATIVE  HGBUR NEGATIVE  BILIRUBINUR NEGATIVE  KETONESUR NEGATIVE  PROTEINUR NEGATIVE  NITRITE NEGATIVE  LEUKOCYTESUR NEGATIVE      Imaging: DG Chest Port 1 View  Result Date: 11/03/2021 CLINICAL DATA:  Increasing weakness, lethargy, history of right-sided non-small cell lung cancer EXAM: PORTABLE CHEST 1 VIEW COMPARISON:  09/21/2021, 07/20/2021 FINDINGS: Single frontal view of the chest demonstrates stable right chest wall port. Cardiac silhouette is unremarkable. There is progressive consolidation within the right upper lobe, with areas of central cavitation. Increased interstitial prominence throughout the right lung. Stable background emphysema. No effusion or pneumothorax. No acute bony abnormality. IMPRESSION: 1. Progressive right upper lobe consolidation with central cavitation, with asymmetric increased right-sided interstitial prominence. Overall, findings are concerning for progression of malignancy, with possible lymphangitic spread  of disease. Cavitating infection or post radiation change could also be considered. Electronically Signed   By: Randa Ngo M.D.   On: 11/03/2021 22:09     Medications:     atorvastatin  40 mg Oral QHS   DULoxetine  20 mg Oral Daily   enoxaparin (LOVENOX) injection  40 mg Subcutaneous Q24H   ferrous sulfate  325 mg Oral BID WC   fluticasone furoate-vilanterol  1 puff Inhalation Daily   And   umeclidinium bromide  1 puff Inhalation Daily   ipratropium-albuterol  3 mL Nebulization BID   levothyroxine  150 mcg Oral QAC breakfast   magnesium oxide  600  mg Oral BID   metoprolol succinate  12.5 mg Oral QHS   [START ON 11/05/2021] pantoprazole  40 mg Oral BID   sodium chloride  1 g Oral TID WC   acetaminophen **OR** acetaminophen, albuterol, ALPRAZolam, benzonatate, ketorolac, lidocaine-prilocaine, morphine injection, nicotine, ondansetron **OR** ondansetron (ZOFRAN) IV  Assessment/ Plan:  [name] is a 66 y.o.  male with medical conditions including hypertension, hyperlipidemia, depression and anxiety, COPD, and left-sided non-small cell carcinoma postradiation and completed chemotherapy.  Patient presents from [place] clinic due to hyponatremia.  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  Chest x-ray and CT scan concerning for progression of disease.  Sodium on admission 122.  Sodium this a.m. 126 after IV fluids overnight.  Believed possible overdiuresis component to hyponatremia.  Would recommend discontinuing torsemide and prescribing furosemide 10 mg daily.  Continue salt tabs as ordered.  Discussed with patient fluid restriction of 30 ounces per day and liberalize diet.  Would prefer sodium goal of 130 before consideration of discharge.  We will continue to monitor.    LOS: 0 Mekaylah Klich 1/4/202312:14 PM

## 2021-11-04 NOTE — Progress Notes (Signed)
PT Cancellation Note  Patient Details Name: Paul Carpenter MRN: [id number] DOB: 02/17/1956   Cancelled Treatment:    Reason Eval/Treat Not Completed: Medical issues which prohibited therapy (Unable to resolve discrepency between cervical collar orders in [place] paperwork v patient's report. EMR says collar x 10 weeks, was given hard collar. Pt in soft collar since being home.) I have reached out to [facility] Neurosurgery PA Cooper Render who follows patient as an outpatient now. PT awaiting clarifying orders for brace/activity restrictions.   9:22 AM, 11/04/21 Etta Grandchild, PT, DPT Physical Therapist - [facility]  [phone] ([place])     Jeffers C 11/04/2021, 9:20 AM

## 2021-11-04 NOTE — Progress Notes (Signed)
Nutrition Brief Note  Patient identified on the Malnutrition Screening Tool (MST) Report  Wt Readings from Last 15 Encounters:  11/03/21 86 kg  11/03/21 85.7 kg  10/15/21 88.7 kg  10/15/21 88.5 kg  09/30/21 85.9 kg  09/21/21 88.5 kg  08/18/21 88 kg  07/30/21 89 kg  05/26/21 89.2 kg  05/01/21 91 kg  04/21/21 92.1 kg  01/26/21 94.2 kg  11/20/20 94.3 kg  09/22/20 92.3 kg  08/20/20 95.3 kg   Paul Carpenter is a 67 y.o. male with medical history significant for COPD, hypertension, current tobacco use, hyperlipidemia, anxiety, depression, OSA with CPAP noncompliance, Raynaud's disease, left-sided non-small cell carcinoma status post radiation and completed chemotherapy in 2019.  Patient presented from [facility] oncology for chief concerns of hyponatremia.  Pt admitted with weakness secondary to hyponatremia.   Reviewed I/O's: +343 ml x 24 hours   Reviewed wt hx; pt has experienced a 3.4% wt loss over the past 3 months, which is not significant for time frame. Per previous RD notes, UBW 160#.   Current diet order is regular, patient is consuming approximately 25-100% of meals at this time. Labs and medications reviewed.   No nutrition interventions warranted at this time. If nutrition issues arise, please consult RD.   Loistine Chance, RD, LDN, [place] Registered Dietitian II Certified Diabetes Care and Education Specialist Please refer to [facility] for RD and/or RD on-call/weekend/after hours pager

## 2021-11-04 NOTE — Progress Notes (Signed)
OT Cancellation Note  Patient Details Name: Paul Carpenter MRN: [id number] DOB: Mar 12, 1956   Cancelled Treatment:    Reason Eval/Treat Not Completed: Medical issues which prohibited therapy. OT order received and chart reviewed. Unable to resolve discrepency between cervical collar orders in [place] paperwork v patient's report. EMR says collar x 10 weeks, was given hard collar. Pt in soft collar since being home. PT reached out to [facility] Neurosurgery PA Cooper Render who follows patient as an outpatient now via secure chat and therapy awaiting clarification before proceeding further.   Darleen Crocker, MS, OTR/L , CBIS ascom [phone]  11/04/21, 11:23 AM

## 2021-11-04 NOTE — Progress Notes (Addendum)
[facility]  PROGRESS NOTE  Paul Carpenter UJW:[id number] DOB: 17-Aug-1956 DOA: 11/03/2021 PCP: Kirk Ruths, MD   Brief HPI:   66 year old male with history of COPD, hypertension, tobacco abuse, hyperlipidemia, anxiety, depression, OSA with CPAP noncompliance, Raynaud's disease, left-sided non-small cell lung carcinoma s/p radiation completed chemotherapy in 2019, presented from group clinic oncology for chief concern for hyponatremia. Patient states that she was feeling weak for past few weeks. In the ED patient was found to have sodium 122.  Started on IV normal saline and salt tablets.   Subjective   This morning patient feels better.  Denies shortness of breath.  Sodium has improved to 126.  Patient recently had a CTA chest done at [facility] which showed right upper lobe mass which has increased from prior studies.  There was concern for malignancy.  Family wants the radiologist at [facility] to review the  CT chest films from [facility].   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 -At that time he was seen by nephrology and was planned to get dose of Samsca however patient decided to leave the hospital. -Yesterday he was started on IV normal saline at 150 mill per hour for 8 hours -Salt tablets were resumed -Sodium today has improved to 126 -Nephrology following  Left-sided non-small cell lung cancer stage III -Patient recently had a CTA chest at [facility] which showed worsening of lung mass -Family requested the films to be reviewed at [facility] -CT chest reviewed by radiologist at [facility] shows increased masslike consolidation posterior right upper lobe which is contiguous with previously seen cavitary right upper lobe mass now extends into superior portion of right lower lobe and  right hilum finding compatible with progression of lung malignancy.  Also shows new spiculated solid nodules in the right upper lobe concerning for additional sites of malignancy. -Called and discussed with patient's oncology Dr. Grayland Ormond, he will see patient in the hospital tomorrow morning.  Neck pain -Patient had a fall on 12/28 with C2 acute fracture -Initially he was recommended to have hard collar in place however it was changed to soft collar by [facility] as per patient's family -I will consult neurosurgery for further recommendations  Hypothyroidism -Continue Synthroid  Hyperlipidemia -Continue atorvastatin  Anxiety -Continue home alprazolam 0.25 mg p.o. twice daily  OSA -Patient noncompliant with CPAP  Normocytic anemia -Patient's hemoglobin this morning is 9.6 -Likely anemia of chronic disease from underlying pregnancy -Lab work showed serum iron 20, saturation 9%, ferritin 662 -Follow hemoglobin in a.m.        Medications     atorvastatin  40 mg Oral QHS   DULoxetine  20 mg Oral Daily   enoxaparin (LOVENOX) injection  40 mg Subcutaneous Q24H   ferrous sulfate  325 mg Oral BID WC   fluticasone furoate-vilanterol  1 puff Inhalation Daily   And   umeclidinium bromide  1 puff Inhalation Daily   ipratropium-albuterol  3 mL Nebulization BID   levothyroxine  150 mcg Oral QAC breakfast   magnesium oxide  600 mg Oral BID   metoprolol succinate  12.5 mg Oral QHS   [START ON 11/05/2021] pantoprazole  40 mg Oral BID   sodium chloride  1 g Oral TID WC     Data Reviewed:   CBG:  No results for input(s): GLUCAP in the last  168 hours.  SpO2: 97 %    Vitals:   11/03/21 2330 11/04/21 0449 11/04/21 0733 11/04/21 1130  BP: 133/76 117/80 122/77 128/79  Pulse: 95 95 92 91  Resp: 18 16 16 16   Temp: 97.6 F (36.4 C) 98 F (36.7 C) 97.6 F (36.4 C) 98.1 F (36.7 C)  TempSrc: Oral Oral Oral Oral  SpO2: 99% 97% 97% 97%  Weight:      Height:          Intake/Output Summary (Last 24 hours) at 11/04/2021 1419 Last data filed at 11/04/2021 1406 Gross per 24 hour  Intake 823.2 ml  Output --  Net 823.2 ml    01/02 1901 - 01/04 0700 In: 343.2 [I.V.:208.2] Out: -   Filed Weights   11/03/21 1729  Weight: 86 kg    Data Reviewed: Basic Metabolic Panel: Recent Labs  Lab 11/03/21 1457 11/03/21 2325 11/04/21 0517  NA 122*  --  126*  K 4.1  --  4.4  CL 85*  --  90*  CO2 25  --  27  GLUCOSE 150*  --  85  BUN 13  --  10  CREATININE 0.85  --  0.67  CALCIUM 8.7*  --  8.6*  MG 1.8 2.0  --   PHOS  --  4.2  --    Liver Function Tests: Recent Labs  Lab 11/03/21 1457  AST 23  ALT 29  ALKPHOS 105  BILITOT 0.2*  PROT 6.8  ALBUMIN 2.9*   No results for input(s): LIPASE, AMYLASE in the last 168 hours. No results for input(s): AMMONIA in the last 168 hours. CBC: Recent Labs  Lab 11/03/21 1457 11/04/21 0517  WBC 10.6* 7.5  NEUTROABS 9.2*  --   HGB 10.1* 9.6*  HCT 29.5* 28.7*  MCV 90.2 90.5  PLT 366 328   Cardiac Enzymes: No results for input(s): CKTOTAL, CKMB, CKMBINDEX, TROPONINI in the last 168 hours. BNP (last 3 results) No results for input(s): BNP in the last 8760 hours.  ProBNP (last 3 results) No results for input(s): PROBNP in the last 8760 hours.  CBG: No results for input(s): GLUCAP in the last 168 hours.     Radiology Reports  DG Chest Port 1 View  Result Date: 11/03/2021 CLINICAL DATA:  Increasing weakness, lethargy, history of right-sided non-small cell lung cancer EXAM: PORTABLE CHEST 1 VIEW COMPARISON:  09/21/2021, 07/20/2021 FINDINGS: Single frontal view of the chest demonstrates stable right chest wall port. Cardiac silhouette is unremarkable. There is progressive consolidation within the right upper lobe, with areas of central cavitation. Increased interstitial prominence throughout the right lung. Stable background emphysema. No effusion or pneumothorax. No acute bony abnormality. IMPRESSION:  1. Progressive right upper lobe consolidation with central cavitation, with asymmetric increased right-sided interstitial prominence. Overall, findings are concerning for progression of malignancy, with possible lymphangitic spread of disease. Cavitating infection or post radiation change could also be considered. Electronically Signed   By: Randa Ngo M.D.   On: 11/03/2021 22:09   CT OUTSIDE FILMS BODY/ABD/PELVIS  Result Date: 11/04/2021 : REVIEW OF OUTSIDE IMAGING - NO PATIENT CHARGE Today's Date: 11/04/2021 Patient Name: Paul Carpenter, Paul Carpenter Patient DOB: Aug 03, 1956 Requesting Provider: Delight Hoh I was asked to review a chest CT with contrast dated November 03, 2021 on the above named patient. Imaging studies reviewed: Prior chest CT performed at this institution on July 20, 2021 was also reviewed for comparison. My response is as follows: Cardiovascular: Normal heart size. No pericardial effusion.  Three-vessel coronary artery calcifications. Thoracic aorta is normal in caliber with moderate atherosclerotic disease. Right chest wall port with tip in the SVC. Mediastinum/nodes: Mildly enlarged right hilar lymph node measuring 1.0 cm on image 73, previously measured 0.7 cm, possibly reactive. Esophagus is unremarkable. Lungs/pleural: Central airways are patent. Centrilobular emphysema. Increased consolidation of the posterior right upper lobe which is contiguous with previously seen cavitary right upper lobe mass which extends into the superior portion of the right lower lobe and along the right hilum. Consolidation measures approximately 8.8 x 5.6 (axial) 14.5 cm (craniocaudal). Numerous new small solid nodules are seen throughout the right lung, but most prominent in the right upper lobe and right middle lobe. New juxtapleural spiculated nodule is seen in the right upper lobe which measures 1.2 x 1.0 cm on series 5, image 49. New spiculated nodule of the right upper lobe located adjacent to the  mediastinum measuring 0.9 x 0.7 cm on image 52. Stable solid nodule of the left lung apex measuring 6 mm on series 5, image 39. Upper abdomen: Low-attenuation liver lesions which are likely simple cysts. No acute abnormality. Musculoskeletal: Old left anterior rib fractures are less conspicuous when compared with prior exam, compatible with interval healing. No aggressive osseous lesions. IMPRESSION: 1. Increased masslike consolidation of the posterior right upper lobe which is contiguous with previously seen cavitary right upper lobe mass and now extends into the superior portion of the right lower lobe and along the right hilum, findings compatible with progression of lung malignancy. 2. New spiculated solid nodules are seen in the right upper lobe which are concerning for additional sites of malignancy. 3. Numerous new small solid nodules are seen throughout the right lung, but most prominent in the right upper lobe and right middle lobe. Findings are likely due to superimposed infection or aspiration. 4. Mildly enlarged right hilar lymph node, which could be reactive or due to metastatic disease. Disclaimer: This information was not provided in response to a formal consult. This response was not provided to render treatment or advise a treatment plan for this patient, as I did not have access to all of this patient's medical information and did not have all of the facts for this current episode of care. Electronically Signed   By: Yetta Glassman M.D.   On: 11/04/2021 12:30       Antibiotics: Anti-infectives (From admission, onward)    None         DVT prophylaxis: Lovenox  Code Status: Full code  Family Communication: No family at bedside   Consultants: Nephrology  Procedures:     Objective    Physical Examination:  General-appears in no acute distress Heart-S1-S2, regular, no murmur auscultated Lungs-clear to auscultation bilaterally, no wheezing or crackles  auscultated Abdomen-soft, nontender, no organomegaly Extremities-no edema in the lower extremities Neuro-alert, oriented x3, no focal deficit noted  Status is: Inpatient  Dispo: The patient is from: Home              Anticipated d/c is to: Home              Anticipated d/c date is: 11/05/2021              Patient currently not stable for discharge  Barrier to discharge-ongoing management for hyponatremia  COVID-19 Labs  Recent Labs    11/03/21 1457  FERRITIN 662*    Lab Results  Component Value Date   [place] NEGATIVE 11/03/2021   [place] NEGATIVE 09/21/2021   [place]  NEGATIVE 07/08/2020   [place] Not Detected 09/03/2019            Recent Results (from the past 240 hour(s))  Resp Panel by RT-PCR (Flu A&B, Covid) Nasopharyngeal Swab     Status: None   Collection Time: 11/03/21  6:58 PM   Specimen: Nasopharyngeal Swab; Nasopharyngeal(NP) swabs in vial transport medium  Result Value Ref Range Status   SARS Coronavirus 2 by RT PCR NEGATIVE NEGATIVE Final    Comment: (NOTE) SARS-CoV-2 target nucleic acids are NOT DETECTED.  The SARS-CoV-2 RNA is generally detectable in upper respiratory specimens during the acute phase of infection. The lowest concentration of SARS-CoV-2 viral copies this assay can detect is 138 copies/mL. A negative result does not preclude SARS-Cov-2 infection and should not be used as the sole basis for treatment or other patient management decisions. A negative result may occur with  improper specimen collection/handling, submission of specimen other than nasopharyngeal swab, presence of viral mutation(s) within the areas targeted by this assay, and inadequate number of viral copies(<138 copies/mL). A negative result must be combined with clinical observations, patient history, and epidemiological information. The expected result is Negative.  Fact Sheet for Patients:  EntrepreneurPulse.com.au  Fact Sheet for  Healthcare Providers:  IncredibleEmployment.be  This test is no t yet approved or cleared by the [place] FDA and  has been authorized for detection and/or diagnosis of SARS-CoV-2 by FDA under an Emergency Use Authorization (EUA). This EUA will remain  in effect (meaning this test can be used) for the duration of the COVID-19 declaration under Section 564(b)(1) of the Act, 21 U.S.C.section 360bbb-3(b)(1), unless the authorization is terminated  or revoked sooner.       Influenza A by PCR NEGATIVE NEGATIVE Final   Influenza B by PCR NEGATIVE NEGATIVE Final    Comment: (NOTE) The Xpert Xpress SARS-CoV-2/FLU/RSV plus assay is intended as an aid in the diagnosis of influenza from Nasopharyngeal swab specimens and should not be used as a sole basis for treatment. Nasal washings and aspirates are unacceptable for Xpert Xpress SARS-CoV-2/FLU/RSV testing.  Fact Sheet for Patients: EntrepreneurPulse.com.au  Fact Sheet for Healthcare Providers: IncredibleEmployment.be  This test is not yet approved or cleared by the [place] FDA and has been authorized for detection and/or diagnosis of SARS-CoV-2 by FDA under an Emergency Use Authorization (EUA). This EUA will remain in effect (meaning this test can be used) for the duration of the COVID-19 declaration under Section 564(b)(1) of the Act, 21 U.S.C. section 360bbb-3(b)(1), unless the authorization is terminated or revoked.  Performed at [facility], [address], [place], [place] [address]     [facility] If 7PM-7AM, please contact night-coverage at [email or web address], Office  [phone]   11/04/2021, 2:19 PM  LOS: 0 days

## 2021-11-04 NOTE — Telephone Encounter (Signed)
[facility] Radiology called to request image comparison.

## 2021-11-05 DIAGNOSIS — E222 Syndrome of inappropriate secretion of antidiuretic hormone: Secondary | ICD-10-CM | POA: Diagnosis not present

## 2021-11-05 DIAGNOSIS — E871 Hypo-osmolality and hyponatremia: Secondary | ICD-10-CM | POA: Diagnosis not present

## 2021-11-05 DIAGNOSIS — J432 Centrilobular emphysema: Secondary | ICD-10-CM | POA: Diagnosis not present

## 2021-11-05 DIAGNOSIS — I10 Essential (primary) hypertension: Secondary | ICD-10-CM | POA: Diagnosis not present

## 2021-11-05 LAB — BASIC METABOLIC PANEL
Anion gap: 10 (ref 5–15)
BUN: 9 mg/dL (ref 8–23)
CO2: 28 mmol/L (ref 22–32)
Calcium: 8.7 mg/dL — ABNORMAL LOW (ref 8.9–10.3)
Chloride: 91 mmol/L — ABNORMAL LOW (ref 98–111)
Creatinine, Ser: 0.65 mg/dL (ref 0.61–1.24)
GFR, Estimated: >60 mL/min (ref >60)
Glucose, Bld: 126 mg/dL — ABNORMAL HIGH (ref 70–99)
Potassium: 4.5 mmol/L (ref 3.5–5.1)
Sodium: 129 mmol/L — ABNORMAL LOW (ref 135–145)

## 2021-11-05 LAB — CBC
HCT: 28.5 % — ABNORMAL LOW (ref 39.0–52.0)
Hemoglobin: 9.5 g/dL — ABNORMAL LOW (ref 13.0–17.0)
MCH: 30.2 pg (ref 26.0–34.0)
MCHC: 33.3 g/dL (ref 30.0–36.0)
MCV: 90.5 fL (ref 80.0–100.0)
Platelets: 324 10*3/uL (ref 150–400)
RBC: 3.15 MIL/uL — ABNORMAL LOW (ref 4.22–5.81)
RDW: 13.5 % (ref 11.5–15.5)
WBC: 7.7 10*3/uL (ref 4.0–10.5)
nRBC: 0 % (ref 0.0–0.2)

## 2021-11-05 MED ORDER — IPRATROPIUM-ALBUTEROL 0.5-2.5 (3) MG/3ML IN SOLN: 3.0000 mL | Freq: Four times a day (QID) | RESPIRATORY_TRACT | 5 refills | Status: DC | PRN

## 2021-11-05 NOTE — Discharge Summary (Signed)
Physician Discharge Summary  Paul Carpenter DSK:[id number] DOB: [birth year] DOA: 11/03/2021  PCP: Kirk Ruths, MD  Admit date: 11/03/2021 Discharge date: 11/05/2021  Time spent: 60 minutes  Recommendations for Outpatient Follow-up:  Follow-up nephrology in 1 week Continue taking salt tablets Stop torsemide Continue fluid restriction 30 ounces per day  Discharge Diagnoses:  Principal Problem:   Hyponatremia Active Problems:   COPD (chronic obstructive pulmonary disease) (HCC)   Tobacco use disorder   Obstructive sleep apnea   Personal history of tobacco use, presenting hazards to health   Leukocytosis   Anxiety   Benign essential hypertension   Mixed hyperlipidemia   SIADH (syndrome of inappropriate ADH production) (HCC)   Overweight (BMI 25.0-29.9)   Discharge Condition: Stable  Diet recommendation: Regular diet  Filed Weights   11/03/21 1729  Weight: 86 kg    History of present illness:  66 year old male with history of COPD, hypertension, tobacco abuse, hyperlipidemia, anxiety, depression, OSA with CPAP noncompliance, Raynaud's disease, left-sided non-small cell lung carcinoma s/p radiation completed chemotherapy in 2019, presented from group clinic oncology for chief concern for hyponatremia. Patient states that she was feeling weak for past few weeks. In the ED patient was found to have sodium 122.  Started on IV normal saline and salt tablets.  Hospital Course:   Generalized weakness from chronic hyponatremia -Patient has hyponatremia, diagnosed with SIADH in the setting of non-small cell lung cancer -He was hospitalized from 11/21-11/22 for hyponatremia from SIADH. -At that time he was seen by nephrology and was planned to get dose of Samsca however patient decided to leave the hospital. -Yesterday he was started on IV normal saline at 150 mill per hour for 8 hours -Salt tablets were resumed -Sodium today has improved to 129 -Nephrology has signed off,.   Nephrology recommends stopping diuretics.  Continue with salt tablets and 30 ounces per day fluid restriction  Orthostatic hypotension -Patient found to have positive orthostasis -Lasix has been discontinued as above -Patient will be given TED hose, to be worn when outside bed -Also advised to get up from the bed slowly -Home health PT has been arranged   Left-sided non-small cell lung cancer stage III -Patient recently had a [facility] chest at [facility] which showed worsening of lung mass -Family requested the films to be reviewed at [facility] -CT chest reviewed by radiologist at [facility] shows increased masslike consolidation posterior right upper lobe which is contiguous with previously seen cavitary right upper lobe mass now extends into superior portion of right lower lobe and right hilum finding compatible with progression of lung malignancy.  Also shows new spiculated solid nodules in the right upper lobe concerning for additional sites of malignancy. -Called and discussed with patient's oncology Dr. Grayland Ormond, he saw patient today and plan for PET scan and biopsy as outpatient  Neck pain -Patient had a fall on 12/28 with C2 acute fracture -Initially he was recommended to have hard collar in place however it was changed to soft collar by [facility] as per patient's family -Neurosurgery was consulted, as per neurosurgery it is stable fracture and he can be discharged in hard collar and out of bed and upright.  Okay to switch to soft collar for low risk activities.  Okay to remove collar to eat and sleep.  Patient can get home health PT   Hypothyroidism -Continue Synthroid  Hypertension -Continue low-dose Toprol-XL   Hyperlipidemia -Continue atorvastatin   Anxiety -Continue home alprazolam 0.25 mg p.o. twice daily  OSA -Patient noncompliant with CPAP   Normocytic anemia -Patient's hemoglobin this morning is 9.5 -Likely anemia of chronic disease from underlying pregnancy -Lab  work showed serum iron 20, saturation 9%, ferritin 662     Procedures:   Consultations: Neurosurgery Nephrology Oncology  Discharge Exam: Vitals:   11/05/21 0844 11/05/21 1306  BP: 127/85 113/86  Pulse: (!) 103 (!) 119  Resp: 16 16  Temp: 98.9 F (37.2 C) 99.3 F (37.4 C)  SpO2: 98% 99%    General: Appears in no acute distress Cardiovascular: S1-S2, regular, no murmur auscultated Respiratory: Clear to auscultation bilaterally  Discharge Instructions   Discharge Instructions     Diet - low sodium heart healthy   Complete by: As directed    Discharge instructions   Complete by: As directed    Neurosurgery recommend hard collar while upright and OOB.  OK to switch to soft collar for low risk activities.  OK to remove collar to eat and sleep.  Wear TED hose while out of bed.  Get up from the bed slowly to avoid sudden drop in blood pressure.   Increase activity slowly   Complete by: As directed       Allergies as of 11/05/2021       Reactions   Lisinopril Rash   Varenicline Rash        Medication List     STOP taking these medications    potassium chloride 10 MEQ tablet Commonly known as: KLOR-CON   predniSONE 20 MG tablet Commonly known as: DELTASONE   torsemide 10 MG tablet Commonly known as: DEMADEX       TAKE these medications    acetaminophen 500 MG tablet Commonly known as: TYLENOL Take 500 mg by mouth daily as needed.   albuterol 108 (90 Base) MCG/ACT inhaler Commonly known as: ProAir HFA Inhale 2 puffs into the lungs every 4 (four) hours as needed for wheezing or shortness of breath.   ALPRAZolam 0.25 MG tablet Commonly known as: XANAX Take 0.25 mg by mouth 2 (two) times daily as needed.   atorvastatin 40 MG tablet Commonly known as: LIPITOR Take 40 mg by mouth at bedtime.   DULoxetine 20 MG capsule Commonly known as: CYMBALTA Take 20 mg by mouth daily. TAKE 2 CAPSULES BY MOUTH ONCE DAILY FOR 7 DAYS, THEN 1 CAPSULE ONCE  DAILY FOR 14 DAYS. What changed: Another medication with the same name was removed. Continue taking this medication, and follow the directions you see here.   ferrous sulfate 325 (65 FE) MG tablet Take 325 mg by mouth 2 (two) times daily with a meal.   ipratropium-albuterol 0.5-2.5 (3) MG/3ML Soln Commonly known as: DUONEB Take 3 mLs by nebulization every 6 (six) hours as needed. What changed:  when to take this reasons to take this   levothyroxine 150 MCG tablet Commonly known as: SYNTHROID Take 150 mcg by mouth daily before breakfast.   lidocaine-prilocaine cream Commonly known as: EMLA Apply 1 application topically as needed.   magnesium oxide 400 MG tablet Commonly known as: MAG-OX Take 600 mg by mouth 2 (two) times daily. Takes 1.5 tablet twice daily   metoprolol succinate 25 MG 24 hr tablet Commonly known as: TOPROL-XL Take 12.5 mg by mouth daily.   multivitamin with minerals Tabs tablet Take 1 tablet by mouth daily.   ondansetron 8 MG tablet Commonly known as: ZOFRAN Take 1 tablet (8 mg total) by mouth every 8 (eight) hours as needed for nausea or vomiting.  OXYGEN Inhale 2 L into the lungs at bedtime.   pantoprazole 40 MG tablet Commonly known as: PROTONIX Take 40 mg by mouth 2 (two) times daily.   sodium chloride 1 g tablet Take 1 tablet (1 g total) by mouth 3 (three) times daily with meals.   tadalafil 5 MG tablet Commonly known as: CIALIS Take 1 tablet (5 mg total) by mouth daily as needed for erectile dysfunction.   Trelegy Ellipta 200-62.5-25 MCG/ACT Aepb Generic drug: Fluticasone-Umeclidin-Vilant Inhale 200 mcg into the lungs daily.       Allergies  Allergen Reactions   Lisinopril Rash   Varenicline Rash      The results of significant diagnostics from this hospitalization (including imaging, microbiology, ancillary and laboratory) are listed below for reference.    Significant Diagnostic Studies: DG Chest Port 1 View  Result Date:  11/03/2021 CLINICAL DATA:  Increasing weakness, lethargy, history of right-sided non-small cell lung cancer EXAM: PORTABLE CHEST 1 VIEW COMPARISON:  09/21/2021, 07/20/2021 FINDINGS: Single frontal view of the chest demonstrates stable right chest wall port. Cardiac silhouette is unremarkable. There is progressive consolidation within the right upper lobe, with areas of central cavitation. Increased interstitial prominence throughout the right lung. Stable background emphysema. No effusion or pneumothorax. No acute bony abnormality. IMPRESSION: 1. Progressive right upper lobe consolidation with central cavitation, with asymmetric increased right-sided interstitial prominence. Overall, findings are concerning for progression of malignancy, with possible lymphangitic spread of disease. Cavitating infection or post radiation change could also be considered. Electronically Signed   By: Randa Ngo M.D.   On: 11/03/2021 22:09   CT OUTSIDE FILMS BODY/ABD/PELVIS  Result Date: 11/04/2021 : REVIEW OF OUTSIDE IMAGING - NO PATIENT CHARGE Today's Date: 11/04/2021 Patient Name: Paul Carpenter, Paul Carpenter Patient DOB: 19-May-1956 Requesting Provider: Delight Hoh I was asked to review a chest CT with contrast dated November 03, 2021 on the above named patient. Imaging studies reviewed: Prior chest CT performed at this institution on July 20, 2021 was also reviewed for comparison. My response is as follows: Cardiovascular: Normal heart size. No pericardial effusion. Three-vessel coronary artery calcifications. Thoracic aorta is normal in caliber with moderate atherosclerotic disease. Right chest wall port with tip in the SVC. Mediastinum/nodes: Mildly enlarged right hilar lymph node measuring 1.0 cm on image 73, previously measured 0.7 cm, possibly reactive. Esophagus is unremarkable. Lungs/pleural: Central airways are patent. Centrilobular emphysema. Increased consolidation of the posterior right upper lobe which is contiguous with  previously seen cavitary right upper lobe mass which extends into the superior portion of the right lower lobe and along the right hilum. Consolidation measures approximately 8.8 x 5.6 (axial) 14.5 cm (craniocaudal). Numerous new small solid nodules are seen throughout the right lung, but most prominent in the right upper lobe and right middle lobe. New juxtapleural spiculated nodule is seen in the right upper lobe which measures 1.2 x 1.0 cm on series 5, image 49. New spiculated nodule of the right upper lobe located adjacent to the mediastinum measuring 0.9 x 0.7 cm on image 52. Stable solid nodule of the left lung apex measuring 6 mm on series 5, image 39. Upper abdomen: Low-attenuation liver lesions which are likely simple cysts. No acute abnormality. Musculoskeletal: Old left anterior rib fractures are less conspicuous when compared with prior exam, compatible with interval healing. No aggressive osseous lesions. IMPRESSION: 1. Increased masslike consolidation of the posterior right upper lobe which is contiguous with previously seen cavitary right upper lobe mass and now extends into the superior  portion of the right lower lobe and along the right hilum, findings compatible with progression of lung malignancy. 2. New spiculated solid nodules are seen in the right upper lobe which are concerning for additional sites of malignancy. 3. Numerous new small solid nodules are seen throughout the right lung, but most prominent in the right upper lobe and right middle lobe. Findings are likely due to superimposed infection or aspiration. 4. Mildly enlarged right hilar lymph node, which could be reactive or due to metastatic disease. Disclaimer: This information was not provided in response to a formal consult. This response was not provided to render treatment or advise a treatment plan for this patient, as I did not have access to all of this patient's medical information and did not have all of the facts for this  current episode of care. Electronically Signed   By: Yetta Glassman M.D.   On: 11/04/2021 12:30    Microbiology: Recent Results (from the past 240 hour(s))  Resp Panel by RT-PCR (Flu A&B, Covid) Nasopharyngeal Swab     Status: None   Collection Time: 11/03/21  6:58 PM   Specimen: Nasopharyngeal Swab; Nasopharyngeal(NP) swabs in vial transport medium  Result Value Ref Range Status   SARS Coronavirus 2 by RT PCR NEGATIVE NEGATIVE Final    Comment: (NOTE) SARS-CoV-2 target nucleic acids are NOT DETECTED.  The SARS-CoV-2 RNA is generally detectable in upper respiratory specimens during the acute phase of infection. The lowest concentration of SARS-CoV-2 viral copies this assay can detect is 138 copies/mL. A negative result does not preclude SARS-Cov-2 infection and should not be used as the sole basis for treatment or other patient management decisions. A negative result may occur with  improper specimen collection/handling, submission of specimen other than nasopharyngeal swab, presence of viral mutation(s) within the areas targeted by this assay, and inadequate number of viral copies(<138 copies/mL). A negative result must be combined with clinical observations, patient history, and epidemiological information. The expected result is Negative.  Fact Sheet for Patients:  EntrepreneurPulse.com.au  Fact Sheet for Healthcare Providers:  IncredibleEmployment.be  This test is no t yet approved or cleared by the [place] FDA and  has been authorized for detection and/or diagnosis of SARS-CoV-2 by FDA under an Emergency Use Authorization (EUA). This EUA will remain  in effect (meaning this test can be used) for the duration of the COVID-19 declaration under Section 564(b)(1) of the Act, 21 U.S.C.section 360bbb-3(b)(1), unless the authorization is terminated  or revoked sooner.       Influenza A by PCR NEGATIVE NEGATIVE Final   Influenza B by  PCR NEGATIVE NEGATIVE Final    Comment: (NOTE) The Xpert Xpress SARS-CoV-2/FLU/RSV plus assay is intended as an aid in the diagnosis of influenza from Nasopharyngeal swab specimens and should not be used as a sole basis for treatment. Nasal washings and aspirates are unacceptable for Xpert Xpress SARS-CoV-2/FLU/RSV testing.  Fact Sheet for Patients: EntrepreneurPulse.com.au  Fact Sheet for Healthcare Providers: IncredibleEmployment.be  This test is not yet approved or cleared by the [place] FDA and has been authorized for detection and/or diagnosis of SARS-CoV-2 by FDA under an Emergency Use Authorization (EUA). This EUA will remain in effect (meaning this test can be used) for the duration of the COVID-19 declaration under Section 564(b)(1) of the Act, 21 U.S.C. section 360bbb-3(b)(1), unless the authorization is terminated or revoked.  Performed at [facility], [address], [place], [place] [address]      Labs: Basic Metabolic  Panel: Recent Labs  Lab 11/03/21 1457 11/03/21 2325 11/04/21 0517 11/04/21 1508 11/05/21 0827  NA 122*  --  126* 126* 129*  K 4.1  --  4.4  --  4.5  CL 85*  --  90*  --  91*  CO2 25  --  27  --  28  GLUCOSE 150*  --  85  --  126*  BUN 13  --  10  --  9  CREATININE 0.85  --  0.67  --  0.65  CALCIUM 8.7*  --  8.6*  --  8.7*  MG 1.8 2.0  --   --   --   PHOS  --  4.2  --   --   --    Liver Function Tests: Recent Labs  Lab 11/03/21 1457  AST 23  ALT 29  ALKPHOS 105  BILITOT 0.2*  PROT 6.8  ALBUMIN 2.9*   No results for input(s): LIPASE, AMYLASE in the last 168 hours. No results for input(s): AMMONIA in the last 168 hours. CBC: Recent Labs  Lab 11/03/21 1457 11/04/21 0517 11/05/21 0827  WBC 10.6* 7.5 7.7  NEUTROABS 9.2*  --   --   HGB 10.1* 9.6* 9.5*  HCT 29.5* 28.7* 28.5*  MCV 90.2 90.5 90.5  PLT 366 328 324        Signed:  Oswald Hillock MD.  [facility] 11/05/2021, 3:18 PM

## 2021-11-05 NOTE — Progress Notes (Signed)
[facility]  ROUNDING NOTE   Subjective:   Paul Carpenter is a 66 year old male with medical conditions including hypertension, hyperlipidemia, depression and anxiety, COPD, and left-sided non-small cell carcinoma postradiation and completed chemotherapy.  [facility].  Patient admitted under observation for Hyponatremia [E87.1] Weakness [R53.1]  Patient resting quietly, alert and oriented Wife and daughter at bedside Slightly improved appetite Denies shortness of breath  Sodium 129   Objective:  Vital signs in last 24 hours:  Temp:  [97.8 F (36.6 C)-99.5 F (37.5 C)] 98.9 F (37.2 C) (01/05 0844) Pulse Rate:  [87-107] 103 (01/05 0844) Resp:  [16] 16 (01/05 0028) BP: (125-134)/(76-85) 127/85 (01/05 0844) SpO2:  [95 %-100 %] 98 % (01/05 0844)  Weight change:  Filed Weights   11/03/21 1729  Weight: 86 kg    Intake/Output: I/O last 3 completed shifts: In: 823.2 [P.O.:480; I.V.:208.2; IV Piggyback:135] Out: 650 [Urine:650]   Intake/Output this shift:  Total I/O In: 120 [P.O.:120] Out: -   Physical Exam: General: NAD, resting in bed  Head: Normocephalic, atraumatic. Moist oral mucosal membranes  Eyes: Anicteric  Neck: Soft neck collar  Lungs:  Clear to auscultation, normal effort, room air  Heart: Regular rate and rhythm  Abdomen:  Soft, nontender,   Extremities: No peripheral edema.  Neurologic: Nonfocal, moving all four extremities  Skin: No lesions       Basic Metabolic Panel: Recent Labs  Lab 11/03/21 1457 11/03/21 2325 11/04/21 0517 11/04/21 1508 11/05/21 0827  NA 122*  --  126* 126* 129*  K 4.1  --  4.4  --  4.5  CL 85*  --  90*  --  91*  CO2 25  --  27  --  28  GLUCOSE 150*  --  85  --  126*  BUN 13  --  10  --  9  CREATININE 0.85  --  0.67  --  0.65  CALCIUM 8.7*  --  8.6*  --  8.7*  MG 1.8 2.0  --   --   --   PHOS  --  4.2  --   --   --      Liver Function Tests: Recent  Labs  Lab 11/03/21 1457  AST 23  ALT 29  ALKPHOS 105  BILITOT 0.2*  PROT 6.8  ALBUMIN 2.9*    No results for input(s): LIPASE, AMYLASE in the last 168 hours. No results for input(s): AMMONIA in the last 168 hours.  CBC: Recent Labs  Lab 11/03/21 1457 11/04/21 0517 11/05/21 0827  WBC 10.6* 7.5 7.7  NEUTROABS 9.2*  --   --   HGB 10.1* 9.6* 9.5*  HCT 29.5* 28.7* 28.5*  MCV 90.2 90.5 90.5  PLT 366 328 324     Cardiac Enzymes: No results for input(s): CKTOTAL, CKMB, CKMBINDEX, TROPONINI in the last 168 hours.  BNP: Invalid input(s): POCBNP  CBG: No results for input(s): GLUCAP in the last 168 hours.  Microbiology: Results for orders placed or performed during the hospital encounter of 11/03/21  Resp Panel by RT-PCR (Flu A&B, Covid) Nasopharyngeal Swab     Status: None   Collection Time: 11/03/21  6:58 PM   Specimen: Nasopharyngeal Swab; Nasopharyngeal(NP) swabs in vial transport medium  Result Value Ref Range Status   SARS Coronavirus 2 by RT PCR NEGATIVE NEGATIVE Final    Comment: (NOTE) SARS-CoV-2 target nucleic acids are NOT DETECTED.  The SARS-CoV-2 RNA is generally detectable in  upper respiratory specimens during the acute phase of infection. The lowest concentration of SARS-CoV-2 viral copies this assay can detect is 138 copies/mL. A negative result does not preclude SARS-Cov-2 infection and should not be used as the sole basis for treatment or other patient management decisions. A negative result may occur with  improper specimen collection/handling, submission of specimen other than nasopharyngeal swab, presence of viral mutation(s) within the areas targeted by this assay, and inadequate number of viral copies(<138 copies/mL). A negative result must be combined with clinical observations, patient history, and epidemiological information. The expected result is Negative.  Fact Sheet for Patients:  EntrepreneurPulse.com.au  Fact Sheet  for Healthcare Providers:  IncredibleEmployment.be  This test is no t yet approved or cleared by the [place] FDA and  has been authorized for detection and/or diagnosis of SARS-CoV-2 by FDA under an Emergency Use Authorization (EUA). This EUA will remain  in effect (meaning this test can be used) for the duration of the COVID-19 declaration under Section 564(b)(1) of the Act, 21 U.S.C.section 360bbb-3(b)(1), unless the authorization is terminated  or revoked sooner.       Influenza A by PCR NEGATIVE NEGATIVE Final   Influenza B by PCR NEGATIVE NEGATIVE Final    Comment: (NOTE) The Xpert Xpress SARS-CoV-2/FLU/RSV plus assay is intended as an aid in the diagnosis of influenza from Nasopharyngeal swab specimens and should not be used as a sole basis for treatment. Nasal washings and aspirates are unacceptable for Xpert Xpress SARS-CoV-2/FLU/RSV testing.  Fact Sheet for Patients: EntrepreneurPulse.com.au  Fact Sheet for Healthcare Providers: IncredibleEmployment.be  This test is not yet approved or cleared by the [place] FDA and has been authorized for detection and/or diagnosis of SARS-CoV-2 by FDA under an Emergency Use Authorization (EUA). This EUA will remain in effect (meaning this test can be used) for the duration of the COVID-19 declaration under Section 564(b)(1) of the Act, 21 U.S.C. section 360bbb-3(b)(1), unless the authorization is terminated or revoked.  Performed at [facility], [address] [place], [place] [address]     Coagulation Studies: No results for input(s): LABPROT, INR in the last 72 hours.  Urinalysis: Recent Labs    11/03/21 2106  COLORURINE YELLOW*  LABSPEC 1.008  PHURINE 5.0  GLUCOSEU NEGATIVE  HGBUR NEGATIVE  BILIRUBINUR NEGATIVE  KETONESUR NEGATIVE  PROTEINUR NEGATIVE  NITRITE NEGATIVE  LEUKOCYTESUR NEGATIVE       Imaging: DG Chest Port 1  View  Result Date: 11/03/2021 CLINICAL DATA:  Increasing weakness, lethargy, history of right-sided non-small cell lung cancer EXAM: PORTABLE CHEST 1 VIEW COMPARISON:  09/21/2021, 07/20/2021 FINDINGS: Single frontal view of the chest demonstrates stable right chest wall port. Cardiac silhouette is unremarkable. There is progressive consolidation within the right upper lobe, with areas of central cavitation. Increased interstitial prominence throughout the right lung. Stable background emphysema. No effusion or pneumothorax. No acute bony abnormality. IMPRESSION: 1. Progressive right upper lobe consolidation with central cavitation, with asymmetric increased right-sided interstitial prominence. Overall, findings are concerning for progression of malignancy, with possible lymphangitic spread of disease. Cavitating infection or post radiation change could also be considered. Electronically Signed   By: Randa Ngo M.D.   On: 11/03/2021 22:09   CT OUTSIDE FILMS BODY/ABD/PELVIS  Result Date: 11/04/2021 : REVIEW OF OUTSIDE IMAGING - NO PATIENT CHARGE Today's Date: 11/04/2021 Patient Name: Paul Carpenter, Paul Carpenter Patient DOB: 07-03-1956 Requesting Provider: Delight Hoh I was asked to review a chest CT with contrast dated November 03, 2021 on the  above named patient. Imaging studies reviewed: Prior chest CT performed at this institution on July 20, 2021 was also reviewed for comparison. My response is as follows: Cardiovascular: Normal heart size. No pericardial effusion. Three-vessel coronary artery calcifications. Thoracic aorta is normal in caliber with moderate atherosclerotic disease. Right chest wall port with tip in the SVC. Mediastinum/nodes: Mildly enlarged right hilar lymph node measuring 1.0 cm on image 73, previously measured 0.7 cm, possibly reactive. Esophagus is unremarkable. Lungs/pleural: Central airways are patent. Centrilobular emphysema. Increased consolidation of the posterior right upper lobe  which is contiguous with previously seen cavitary right upper lobe mass which extends into the superior portion of the right lower lobe and along the right hilum. Consolidation measures approximately 8.8 x 5.6 (axial) 14.5 cm (craniocaudal). Numerous new small solid nodules are seen throughout the right lung, but most prominent in the right upper lobe and right middle lobe. New juxtapleural spiculated nodule is seen in the right upper lobe which measures 1.2 x 1.0 cm on series 5, image 49. New spiculated nodule of the right upper lobe located adjacent to the mediastinum measuring 0.9 x 0.7 cm on image 52. Stable solid nodule of the left lung apex measuring 6 mm on series 5, image 39. Upper abdomen: Low-attenuation liver lesions which are likely simple cysts. No acute abnormality. Musculoskeletal: Old left anterior rib fractures are less conspicuous when compared with prior exam, compatible with interval healing. No aggressive osseous lesions. IMPRESSION: 1. Increased masslike consolidation of the posterior right upper lobe which is contiguous with previously seen cavitary right upper lobe mass and now extends into the superior portion of the right lower lobe and along the right hilum, findings compatible with progression of lung malignancy. 2. New spiculated solid nodules are seen in the right upper lobe which are concerning for additional sites of malignancy. 3. Numerous new small solid nodules are seen throughout the right lung, but most prominent in the right upper lobe and right middle lobe. Findings are likely due to superimposed infection or aspiration. 4. Mildly enlarged right hilar lymph node, which could be reactive or due to metastatic disease. Disclaimer: This information was not provided in response to a formal consult. This response was not provided to render treatment or advise a treatment plan for this patient, as I did not have access to all of this patient's medical information and did not have all  of the facts for this current episode of care. Electronically Signed   By: Yetta Glassman M.D.   On: 11/04/2021 12:30     Medications:     atorvastatin  40 mg Oral QHS   DULoxetine  20 mg Oral Daily   enoxaparin (LOVENOX) injection  40 mg Subcutaneous Q24H   ferrous sulfate  325 mg Oral BID WC   fluticasone furoate-vilanterol  1 puff Inhalation Daily   And   umeclidinium bromide  1 puff Inhalation Daily   furosemide  10 mg Oral Daily   ipratropium-albuterol  3 mL Nebulization BID   levothyroxine  150 mcg Oral QAC breakfast   magnesium oxide  600 mg Oral BID   metoprolol succinate  12.5 mg Oral QHS   pantoprazole  40 mg Oral BID   sodium chloride  1 g Oral TID WC   acetaminophen **OR** acetaminophen, albuterol, ALPRAZolam, benzonatate, lidocaine-prilocaine, morphine injection, nicotine, ondansetron **OR** ondansetron (ZOFRAN) IV  Assessment/ Plan:  Paul Carpenter is a 66 y.o.  male with medical conditions including hypertension, hyperlipidemia, depression and anxiety, COPD,  and left-sided non-small cell carcinoma postradiation and completed chemotherapy.  Patient presents from [facility] due to hyponatremia.  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  Chest x-ray and CT scan concerning for progression of disease.  Sodium on admission 122. Believed possible overdiuresis component to hyponatremia.  Would recommend discontinuing torsemide and prescribing furosemide 10 mg daily.  Continue salt tabs as ordered.  Discussed with patient fluid restriction of 30 ounces per day and liberalize diet.    -Sodium improved to 129  -Adequate urine output recorded   -Continue Furosemide  -Patient cleared to discharge from renal stance. Follow up appt scheduled with our office on Monday    LOS: 0 Jacqualyn Sedgwick 1/5/202311:06 AM

## 2021-11-05 NOTE — Progress Notes (Signed)
Orthopedic Tech Progress Note Patient Details:  KHIEM GARGIS Mar 23, 1956 [id number]  Called [employer] order and was informed aspen collar had already been called in from [facility].  Patient ID: TRAVION KE, male   DOB: August 16, 1956, 66 y.o.   MRN: [id number]  Carin Primrose 11/05/2021, 12:33 PM

## 2021-11-05 NOTE — Evaluation (Signed)
Occupational Therapy Evaluation Patient Details Name: Paul Carpenter MRN: [id number] DOB: 05-May-1956 Today's Date: 11/05/2021   History of Present Illness 66 year old male with medical conditions including hypertension, hyperlipidemia, depression and anxiety, COPD, and left-sided non-small cell carcinoma postradiation and completed chemotherapy.   Clinical Impression   Patient presenting with decreased Ind in self care, balance, functional mobility/transfers, endurance, and safety awareness. Patient reports living with wife at home and utilizing 3 WW for ambulation. Pt's wife works very close and is able to just walk over during the day to assist as needed. Pt with recent C2 fx and per neurosurgery there is a preference for hard collar to be worn for mobility but can ambulate with soft collar. Hard collar is not yet available in room.Up until 1 week ago, pt was independent in all aspects of self care with wife assisting with IADLs. Patient currently functioning at close supervision for mobility with RW. Pt able to ambulate 200' without LOB and does not report SOB. His HR does increase to 130's with mobility task when returning to room. OT discussed energy conservation strategies for home with pt and family verbalizing understanding. Patient will benefit from acute OT to increase overall independence in the areas of ADLs, functional mobility, and safety awareness in order to safely discharge home with family.      Recommendations for follow up therapy are one component of a multi-disciplinary discharge planning process, led by the attending physician.  Recommendations may be updated based on patient status, additional functional criteria and insurance authorization.   Follow Up Recommendations  No OT follow up    Assistance Recommended at Discharge Set up Supervision/Assistance  Patient can return home with the following A little help with walking and/or transfers;A little help with  bathing/dressing/bathroom    Functional Status Assessment  Patient has had a recent decline in their functional status and demonstrates the ability to make significant improvements in function in a reasonable and predictable amount of time.  Equipment Recommendations  None recommended by OT       Precautions / Restrictions Precautions Precautions: Fall Required Braces or Orthoses: Cervical Brace Cervical Brace: Hard collar;Other (comment) (Hard collar on when OOB for mobility. No collar when pt is in bed or eating.)      Mobility Bed Mobility Overal bed mobility: Modified Independent                  Transfers Overall transfer level: Needs assistance Equipment used: Rolling walker (2 wheels) Transfers: Sit to/from Stand;Bed to chair/wheelchair/BSC Sit to Stand: Supervision     Step pivot transfers: Supervision            Balance Overall balance assessment: Needs assistance Sitting-balance support: Feet supported Sitting balance-Leahy Scale: Good     Standing balance support: During functional activity;Reliant on assistive device for balance Standing balance-Leahy Scale: Fair                             ADL either performed or assessed with clinical judgement   ADL Overall ADL's : Needs assistance/impaired                                       General ADL Comments: supervision overall with RW     Vision Patient Visual Report: No change from baseline  Pertinent Vitals/Pain Pain Assessment: No/denies pain     Hand Dominance Right   Extremity/Trunk Assessment Upper Extremity Assessment Upper Extremity Assessment: Generalized weakness;Overall [facility] for tasks assessed   Lower Extremity Assessment Lower Extremity Assessment: Defer to PT evaluation       Communication Communication Communication: No difficulties   Cognition Arousal/Alertness: Awake/alert Behavior During Therapy: WFL for tasks  assessed/performed Overall Cognitive Status: Within Functional Limits for tasks assessed                                 General Comments: Pt is A & O x4 and very pleasant.                Home Living Family/patient expects to be discharged to:: Private residence Living Arrangements: Spouse/significant other Available Help at Discharge: Family;Available PRN/intermittently Type of Home: House Home Access: Stairs to enter [employer] of Steps: 2   Home Layout: One level     Bathroom Shower/Tub: Tub/shower unit         Home Equipment: Other (comment);Shower seat;Tub bench ([employer])          Prior Functioning/Environment Prior Level of Function : Independent/Modified Independent                        OT Problem List: Decreased strength;Decreased activity tolerance;Impaired balance (sitting and/or standing);Decreased safety awareness;Decreased knowledge of use of DME or AE      OT Treatment/Interventions: Self-care/ADL training;Therapeutic exercise;Patient/family education;Balance training;Energy conservation;Therapeutic activities;DME and/or AE instruction    OT Goals(Current goals can be found in the care plan section) Acute Rehab OT Goals Patient Stated Goal: to go home OT Goal Formulation: With patient/family Time For Goal Achievement: 11/19/21 Potential to Achieve Goals: Good ADL Goals Pt Will Perform Grooming: with modified independence Pt Will Perform Lower Body Dressing: with modified independence Pt Will Transfer to Toilet: with modified independence Pt Will Perform Toileting - Clothing Manipulation and hygiene: with modified independence  OT Frequency: Min 2X/week       AM-PAC OT "6 Clicks" Daily Activity     Outcome Measure Help from another person eating meals?: None Help from another person taking care of personal grooming?: None Help from another person toileting, which includes using toliet, bedpan, or urinal?: A  Little Help from another person bathing (including washing, rinsing, drying)?: A Little Help from another person to put on and taking off regular upper body clothing?: None Help from another person to put on and taking off regular lower body clothing?: A Little 6 Click Score: 21   End of Session Equipment Utilized During Treatment: Rolling walker (2 wheels) Nurse Communication: Mobility status (HR)  Activity Tolerance: Patient tolerated treatment well;Other (comment) (elevated HR) Patient left: in bed;with call bell/phone within reach;with family/visitor present  OT Visit Diagnosis: Unsteadiness on feet (R26.81);Repeated falls (R29.6);Muscle weakness (generalized) (M62.81)                Time: 3546-5681 OT Time Calculation (min): 19 min Charges:  OT General Charges $OT Visit: 1 Visit OT Evaluation $OT Eval Low Complexity: 1 Low OT Treatments $Therapeutic Activity: 8-22 mins  Darleen Crocker, MS, OTR/L , CBIS ascom [phone]  11/05/21, 12:56 PM

## 2021-11-05 NOTE — Progress Notes (Signed)
Orthostatic vitals taken during PT evaluation:     11/05/21 1059  Therapy Vitals  Patient Position (if appropriate) Orthostatic Vitals  Orthostatic Lying   BP- Lying 130/71  Pulse- Lying 122  Orthostatic Sitting  BP- Sitting 136/79  Pulse- Sitting 129  Orthostatic Standing at 0 minutes  BP- Standing at 0 minutes 92/56 (mildly symptomatic (nonspecific presyncope))   Pulse- Standing at 0 minutes 117  Orthostatic Standing at 1 minutes  BP- Standing at 1 minutes (!) 88/65 (mildly symptomatic (nonspecific presyncope))  Pulse- Standing at 1 minutes 143   Orthostatic Standing at 2 minutes  BP- Standing at 2 minutes 103/72  Pulse- Standing at 2 minutes 147   Did not take a 3 minutes assessment due to sustained and progressive tachycardia and onset DOE.  11:18 AM, 11/05/21 Etta Grandchild, PT, DPT Physical Therapist - [facility]  [phone] [facility])

## 2021-11-05 NOTE — Consult Note (Signed)
Referring Physician:  No referring provider defined for this encounter.  Primary Physician:  Kirk Ruths, MD  Chief Complaint:  C2 fracture  History of Present Illness: 11/05/2021 Paul Carpenter is a 66 y.o. male who presents with the chief complaint of C2 fracture.  He had a fall and was evaluated at [facility], where he was placed in a hard collar.  He then was discharged and represented to [facility] with hyponatremia.  He was admitted for this and has been undergoing treatment.  NSGY consulted for recommendations on collar.   Review of Systems:  A 10 point review of systems is negative, except for the pertinent positives and negatives detailed in the HPI.  Past Medical History: Past Medical History:  Diagnosis Date   Anginal pain ([place])    Anxiety    Asthma    Chest pain    CHF (congestive heart failure) (HCC)    Chicken pox    Complication of anesthesia    o2 dropped after neck fusion   COPD (chronic obstructive pulmonary disease) (HCC)    Coronary artery disease    Cough    chronic  clear phlegm   Dysrhythmia    palpitations   GERD (gastroesophageal reflux disease)    h/o reflux/ hoarsness   Hematochezia    Hemorrhoids    History of chickenpox    History of colon polyps    History of Helicobacter pylori infection    Hoarseness    Hypertension    Lung cancer ([place]) 05/2016   Chemo + rad tx's.    Migraines    OSA (obstructive sleep apnea)    has CPAP but does not use   Personal history of tobacco use, presenting hazards to health 03/05/2016   Pneumonia    5/17   Raynaud disease    Raynaud disease    Raynaud's disease    Rotator cuff tear    on right   Shortness of breath dyspnea    Sleep apnea    Ulcer (traumatic) of oral mucosa     Past Surgical History: Past Surgical History:  Procedure Laterality Date   BACK SURGERY     cervical fusion x 2   CARDIAC CATHETERIZATION     CERVICAL DISCECTOMY     x 2   COLONOSCOPY     COLONOSCOPY N/A 07/25/2015    Procedure: COLONOSCOPY;  Surgeon: Lollie Sails, MD;  Location: [facility] ENDOSCOPY;  Service: Endoscopy;  Laterality: N/A;   COLONOSCOPY WITH PROPOFOL N/A 10/04/2017   Procedure: COLONOSCOPY WITH PROPOFOL;  Surgeon: Lollie Sails, MD;  Location: [facility] ENDOSCOPY;  Service: Endoscopy;  Laterality: N/A;   COLONOSCOPY WITH PROPOFOL N/A 07/10/2020   Procedure: COLONOSCOPY WITH PROPOFOL;  Surgeon: Toledo, Benay Pike, MD;  Location: [facility] ENDOSCOPY;  Service: Gastroenterology;  Laterality: N/A;   ELECTROMAGNETIC NAVIGATION BROCHOSCOPY Left 06/28/2016   Procedure: ELECTROMAGNETIC NAVIGATION BRONCHOSCOPY;  Surgeon: Vilinda Boehringer, MD;  Location: [facility] ORS;  Service: Cardiopulmonary;  Laterality: Left;   ENDOBRONCHIAL ULTRASOUND N/A 04/11/2018   Procedure: ENDOBRONCHIAL ULTRASOUND;  Surgeon: [name], MD;  Location: [facility] ORS;  Service: Cardiopulmonary;  Laterality: N/A;   ESOPHAGOGASTRODUODENOSCOPY N/A 07/25/2015   Procedure: ESOPHAGOGASTRODUODENOSCOPY (EGD);  Surgeon: Lollie Sails, MD;  Location: [facility] ENDOSCOPY;  Service: Endoscopy;  Laterality: N/A;   ESOPHAGOGASTRODUODENOSCOPY (EGD) WITH PROPOFOL N/A 07/10/2020   Procedure: ESOPHAGOGASTRODUODENOSCOPY (EGD) WITH PROPOFOL;  Surgeon: Toledo, Benay Pike, MD;  Location: [facility] ENDOSCOPY;  Service: Gastroenterology;  Laterality: N/A;   NASAL SINUS SURGERY  x 2    PORTA CATH INSERTION N/A 04/24/2018   Procedure: PORTA CATH INSERTION;  Surgeon: Algernon Huxley, MD;  Location: [place] CV LAB;  Service: Cardiovascular;  Laterality: N/A;   rotator cuff surgery Right    07/2016   SEPTOPLASTY     SKIN GRAFT      Allergies: Allergies as of 11/03/2021 - Review Complete 11/03/2021  Allergen Reaction Noted   Lisinopril Rash 05/06/2015   Varenicline Rash 05/06/2015    Medications:  Current Facility-Administered Medications:    acetaminophen (TYLENOL) tablet 1,000 mg, 1,000 mg, Oral, Q6H PRN, 1,000 mg at 11/04/21 1134 **OR** acetaminophen (TYLENOL)  suppository 650 mg, 650 mg, Rectal, Q6H PRN, Cox, Amy N, DO   albuterol (PROVENTIL) (2.5 MG/3ML) 0.083% nebulizer solution 3 mL, 3 mL, Inhalation, Q4H PRN, Cox, Amy N, DO   ALPRAZolam (XANAX) tablet 0.25 mg, 0.25 mg, Oral, BID PRN, Cox, Amy N, DO, 0.25 mg at 11/04/21 1551   atorvastatin (LIPITOR) tablet 40 mg, 40 mg, Oral, QHS, Cox, Amy N, DO, 40 mg at 11/04/21 2044   benzonatate (TESSALON) capsule 100 mg, 100 mg, Oral, TID PRN, Oswald Hillock, MD, 100 mg at 11/04/21 1134   DULoxetine (CYMBALTA) DR capsule 20 mg, 20 mg, Oral, Daily, Cox, Amy N, DO, 20 mg at 11/04/21 1134   enoxaparin (LOVENOX) injection 40 mg, 40 mg, Subcutaneous, Q24H, Cox, Amy N, DO, 40 mg at 11/04/21 2043   ferrous sulfate tablet 325 mg, 325 mg, Oral, BID WC, Cox, Amy N, DO, 325 mg at 11/04/21 1637   fluticasone furoate-vilanterol (BREO ELLIPTA) 200-25 MCG/ACT 1 puff, 1 puff, Inhalation, Daily, 1 puff at 11/04/21 0823 **AND** umeclidinium bromide (INCRUSE ELLIPTA) 62.5 MCG/ACT 1 puff, 1 puff, Inhalation, Daily, Cox, Amy N, DO, 1 puff at 11/04/21 2694   furosemide (LASIX) tablet 10 mg, 10 mg, Oral, Daily, Breeze, Shantelle, NP, 10 mg at 11/04/21 1552   ipratropium-albuterol (DUONEB) 0.5-2.5 (3) MG/3ML nebulizer solution 3 mL, 3 mL, Nebulization, BID, Darrick Meigs, Marge Duncans, MD, 3 mL at 11/04/21 2010   levothyroxine (SYNTHROID) tablet 150 mcg, 150 mcg, Oral, QAC breakfast, Cox, Amy N, DO, 150 mcg at 11/05/21 8546   lidocaine-prilocaine (EMLA) cream 1 application, 1 application, Topical, PRN, Cox, Amy N, DO   magnesium oxide (MAG-OX) tablet 600 mg, 600 mg, Oral, BID, Cox, Amy N, DO, 600 mg at 11/04/21 2043   metoprolol succinate (TOPROL-XL) 24 hr tablet 12.5 mg, 12.5 mg, Oral, QHS, Cox, Amy N, DO, 12.5 mg at 11/04/21 2045   morphine 2 MG/ML injection 1 mg, 1 mg, Intravenous, Q4H PRN, Cox, Amy N, DO, 1 mg at 11/05/21 2703   nicotine (NICODERM CQ - dosed in mg/24 hours) patch 21 mg, 21 mg, Transdermal, Daily PRN, Cox, Amy N, DO, 21 mg at  11/04/21 0559   ondansetron (ZOFRAN) tablet 4 mg, 4 mg, Oral, Q6H PRN **OR** ondansetron (ZOFRAN) injection 4 mg, 4 mg, Intravenous, Q6H PRN, Cox, Amy N, DO   pantoprazole (PROTONIX) EC tablet 40 mg, 40 mg, Oral, BID, Cox, Amy N, DO   sodium chloride tablet 1 g, 1 g, Oral, TID WC, Cox, Amy N, DO, 1 g at 11/04/21 1552   Social History: Social History   Tobacco Use   Smoking status: Every Day    Packs/day: 3.00    Years: 45.00    Pack years: 135.00    Types: Cigarettes   Smokeless tobacco: Never   Tobacco comments:    2 PPD 04/21/2021  Vaping Use  Vaping Use: Never used  Substance Use Topics   Alcohol use: Yes    Alcohol/week: 2.0 standard drinks    Types: 2 Standard drinks or equivalent per week    Comment: moderate   Drug use: No    Family Medical History: Family History  Problem Relation Age of Onset   Heart disease Father    Prostate cancer Father    Heart disease Paternal Grandmother    Heart attack Maternal Grandfather 43   Kidney cancer Neg Hx    Bladder Cancer Neg Hx    Other Neg Hx        pituitary abnormality    Physical Examination: Vitals:   11/04/21 2010 11/05/21 0028  BP:  125/76  Pulse:  (!) 107  Resp:  16  Temp:  99.5 F (37.5 C)  SpO2: 95% 100%     General: Patient is well developed, well nourished, calm, collected, and in no apparent distress.  Psychiatric: Patient is non-anxious.  Head:  Pupils equal, round, and reactive to light.  ENT:  Oral mucosa appears well hydrated.  Neck:   Supple.  In soft collar.  Respiratory: Patient is breathing without any difficulty.  Extremities: No edema.  Vascular: Palpable pulses in dorsal pedal vessels.  Skin:   On exposed skin, there are no abnormal skin lesions.  NEUROLOGICAL:  General: In no acute distress.   Awake, alert, oriented to person, place, and time.  Pupils equal round and reactive to light.  Facial tone is symmetric.  Tongue protrusion is midline.  There is no pronator  drift.   Strength: Side Biceps Triceps Deltoid Interossei Grip Wrist Ext. Wrist Flex.  R 5 5 5 5 5 5 5   L 5 5 5 5 5 5 5    Side Iliopsoas Quads Hamstring PF DF EHL  R 5 5 5 5 5 5   L 5 5 5 5 5 5     Bilateral upper and lower extremity sensation is intact to light touch. Reflexes are 1+ and symmetric at the biceps, triceps, brachioradialis, patella and achilles. Hoffman's is absent.  Resting in bed.  Gait untested.  Imaging: MRI C spine 10/28/21 Impression  --Nondisplaced fracture involving the inferior endplate of the C2 vertebral body, unchanged. Apparent thinning of the anterior atlantoaxial ligament which could reflect injury to this ligament. No other evidence of ligamentous injury.   --Multilevel cervical spondylosis as detailed above, most pronounced at C2-3 resulting in moderate spinal canal stenosis as well as moderate right neural foraminal narrowing as well as at C4-C5 where there is severe left neural foraminal narrowing.  I have personally reviewed the images and agree with the above interpretation.  Labs: CBC Latest Ref Rng & Units 11/04/2021 11/03/2021 10/15/2021  WBC 4.0 - 10.5 K/uL 7.5 10.6(H) 9.7  Hemoglobin 13.0 - 17.0 g/dL 9.6(L) 10.1(L) 11.0(L)  Hematocrit 39.0 - 52.0 % 28.7(L) 29.5(L) 32.9(L)  Platelets 150 - 400 K/uL 328 366 268       Assessment and Plan: Mr. Pursifull is a pleasant 66 y.o. male with C2 fracture, minimally displaced.  This is a stable fracture.  I would recommend hard collar while upright and OOB.  OK to switch to soft collar for low risk activities.  OK to remove collar to eat and sleep.  - Follow up as planned next week. - I will order a hard collar. - OK to work with PT and OT.   Keisean Skowron K. Izora Ribas MD, [place] Dept[facility] of Neurosurgery

## 2021-11-05 NOTE — Consult Note (Addendum)
[place]  Telephone:([phone]) [phone] Fax:(336) [phone]  ID: [name] OB: [birth year]  MR#: [id number]  QHU#:[id number]  Patient Care Team: Kirk Ruths, MD as PCP - General (Internal Medicine) [name], MD as Referring Physician (Vascular Surgery) [name], MD as Consulting Physician (Oncology) [name], MD as Referring Physician (Radiation Oncology)  CHIEF COMPLAINT: Recurrent non-small cell carcinoma of the lung.  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  He was recently admitted to the hospital at [facility] and CT scan of chest revealed progressive disease.  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  He continues to have weakness and fatigue, but has improved since admission.  He has no neurologic complaints.  He denies any recent fevers.  He has a poor appetite.  He has chronic shortness of breath, but denies any chest pain, hemoptysis, or cough.  He has no nausea, vomiting, constipation, or diarrhea.  He has no urinary complaints.  Patient feels generally terrible, but offers no further specific complaints today.  REVIEW OF SYSTEMS:   Review of Systems  Constitutional:  Positive for malaise/fatigue. Negative for fever and weight loss.  Respiratory:  Positive for shortness of breath. Negative for cough and hemoptysis.   Cardiovascular: Negative.  Negative for chest pain and leg swelling.  Gastrointestinal: Negative.  Negative for abdominal pain and nausea.  Genitourinary: Negative.  Negative for dysuria.  Musculoskeletal: Negative.  Negative for back pain.  Skin: Negative.  Negative for rash.  Neurological:  Positive for weakness. Negative for dizziness, focal weakness and headaches.  Psychiatric/Behavioral: Negative.   The patient is not nervous/anxious.    As per HPI. Otherwise, a complete review of systems is negative.  PAST MEDICAL HISTORY: Past Medical History:  Diagnosis Date   Anginal pain ([place])    Anxiety    Asthma    Chest pain    CHF (congestive heart failure) (HCC)    Chicken pox    Complication of anesthesia    o2 dropped after neck fusion   COPD (chronic obstructive pulmonary disease) (HCC)    Coronary artery disease    Cough    chronic  clear phlegm   Dysrhythmia    palpitations   GERD (gastroesophageal reflux disease)    h/o reflux/ hoarsness   Hematochezia    Hemorrhoids    History of chickenpox    History of colon polyps    History of Helicobacter pylori infection    Hoarseness    Hypertension    Lung cancer ([place]) 05/2016   Chemo + rad tx's.    Migraines    OSA (obstructive sleep apnea)    has CPAP but does not use   Personal history of tobacco use, presenting hazards to health 03/05/2016   Pneumonia    5/17   Raynaud disease    Raynaud disease    Raynaud's disease    Rotator cuff tear    on right   Shortness of breath dyspnea    Sleep apnea    Ulcer (traumatic) of oral mucosa     PAST SURGICAL HISTORY: Past Surgical History:  Procedure Laterality Date   BACK SURGERY     cervical fusion x 2   CARDIAC CATHETERIZATION     CERVICAL DISCECTOMY     x 2   COLONOSCOPY  COLONOSCOPY N/A 07/25/2015   Procedure: COLONOSCOPY;  Surgeon: Lollie Sails, MD;  Location: [facility] ENDOSCOPY;  Service: Endoscopy;  Laterality: N/A;   COLONOSCOPY WITH PROPOFOL N/A 10/04/2017   Procedure: COLONOSCOPY WITH PROPOFOL;  Surgeon: Lollie Sails, MD;  Location: [facility] ENDOSCOPY;  Service: Endoscopy;  Laterality: N/A;   COLONOSCOPY WITH PROPOFOL N/A 07/10/2020   Procedure: COLONOSCOPY WITH PROPOFOL;  Surgeon: Toledo, Benay Pike, MD;  Location: [facility] ENDOSCOPY;  Service: Gastroenterology;  Laterality: N/A;   ELECTROMAGNETIC NAVIGATION BROCHOSCOPY Left 06/28/2016   Procedure:  ELECTROMAGNETIC NAVIGATION BRONCHOSCOPY;  Surgeon: Vilinda Boehringer, MD;  Location: [facility] ORS;  Service: Cardiopulmonary;  Laterality: Left;   ENDOBRONCHIAL ULTRASOUND N/A 04/11/2018   Procedure: ENDOBRONCHIAL ULTRASOUND;  Surgeon: [name], MD;  Location: [facility] ORS;  Service: Cardiopulmonary;  Laterality: N/A;   ESOPHAGOGASTRODUODENOSCOPY N/A 07/25/2015   Procedure: ESOPHAGOGASTRODUODENOSCOPY (EGD);  Surgeon: Lollie Sails, MD;  Location: [facility] ENDOSCOPY;  Service: Endoscopy;  Laterality: N/A;   ESOPHAGOGASTRODUODENOSCOPY (EGD) WITH PROPOFOL N/A 07/10/2020   Procedure: ESOPHAGOGASTRODUODENOSCOPY (EGD) WITH PROPOFOL;  Surgeon: Toledo, Benay Pike, MD;  Location: [facility] ENDOSCOPY;  Service: Gastroenterology;  Laterality: N/A;   NASAL SINUS SURGERY     x 2    PORTA CATH INSERTION N/A 04/24/2018   Procedure: PORTA CATH INSERTION;  Surgeon: Algernon Huxley, MD;  Location: [place] CV LAB;  Service: Cardiovascular;  Laterality: N/A;   rotator cuff surgery Right    07/2016   SEPTOPLASTY     SKIN GRAFT      FAMILY HISTORY: Family History  Problem Relation Age of Onset   Heart disease Father    Prostate cancer Father    Heart disease Paternal Grandmother    Heart attack Maternal Grandfather 39   Kidney cancer Neg Hx    Bladder Cancer Neg Hx    Other Neg Hx        pituitary abnormality    ADVANCED DIRECTIVES (Y/N):  @ADVDIR @  HEALTH MAINTENANCE: Social History   Tobacco Use   Smoking status: Every Day    Packs/day: 3.00    Years: 45.00    Pack years: 135.00    Types: Cigarettes   Smokeless tobacco: Never   Tobacco comments:    2 PPD 04/21/2021  Vaping Use   Vaping Use: Never used  Substance Use Topics   Alcohol use: Yes    Alcohol/week: 2.0 standard drinks    Types: 2 Standard drinks or equivalent per week    Comment: moderate   Drug use: No     Colonoscopy:  PAP:  Bone density:  Lipid panel:  Allergies  Allergen Reactions   Lisinopril Rash   Varenicline Rash     Current Facility-Administered Medications  Medication Dose Route Frequency Provider Last Rate Last Admin   acetaminophen (TYLENOL) tablet 1,000 mg  1,000 mg Oral Q6H PRN Cox, Amy N, DO   1,000 mg at 11/04/21 1134   Or   acetaminophen (TYLENOL) suppository 650 mg  650 mg Rectal Q6H PRN Cox, Amy N, DO       ALPRAZolam (XANAX) tablet 0.25 mg  0.25 mg Oral BID PRN Cox, Amy N, DO   0.25 mg at 11/04/21 1551   atorvastatin (LIPITOR) tablet 40 mg  40 mg Oral QHS Cox, Amy N, DO   40 mg at 11/04/21 2044   benzonatate (TESSALON) capsule 100 mg  100 mg Oral TID PRN Oswald Hillock, MD   100 mg at 11/05/21 0858   DULoxetine (CYMBALTA) DR capsule 20 mg  20 mg Oral Daily Cox, Amy N, DO   20 mg at 11/04/21 1134   enoxaparin (LOVENOX) injection 40 mg  40 mg Subcutaneous Q24H Cox, Amy N, DO   40 mg at 11/04/21 2043   ferrous sulfate tablet 325 mg  325 mg Oral BID WC Cox, Amy N, DO   325 mg at 11/05/21 1640   ipratropium-albuterol (DUONEB) 0.5-2.5 (3) MG/3ML nebulizer solution 3 mL  3 mL Nebulization BID Oswald Hillock, MD   3 mL at 11/05/21 3559   levothyroxine (SYNTHROID) tablet 150 mcg  150 mcg Oral QAC breakfast Cox, Amy N, DO   150 mcg at 11/05/21 7416   lidocaine-prilocaine (EMLA) cream 1 application  1 application Topical PRN Cox, Amy N, DO       magnesium oxide (MAG-OX) tablet 600 mg  600 mg Oral BID Cox, Amy N, DO   600 mg at 11/05/21 0858   metoprolol succinate (TOPROL-XL) 24 hr tablet 12.5 mg  12.5 mg Oral QHS Cox, Amy N, DO   12.5 mg at 11/04/21 2045   morphine 2 MG/ML injection 1 mg  1 mg Intravenous Q4H PRN Cox, Amy N, DO   1 mg at 11/05/21 3845   nicotine (NICODERM CQ - dosed in mg/24 hours) patch 21 mg  21 mg Transdermal Daily PRN Cox, Amy N, DO   21 mg at 11/04/21 0559   ondansetron (ZOFRAN) tablet 4 mg  4 mg Oral Q6H PRN Cox, Amy N, DO       Or   ondansetron (ZOFRAN) injection 4 mg  4 mg Intravenous Q6H PRN Cox, Amy N, DO       pantoprazole (PROTONIX) EC tablet 40 mg  40 mg Oral BID Cox, Amy  N, DO   40 mg at 11/05/21 0858   sodium chloride tablet 1 g  1 g Oral TID WC Cox, Amy N, DO   1 g at 11/05/21 1640    OBJECTIVE: Vitals:   11/05/21 0844 11/05/21 1306  BP: 127/85 113/86  Pulse: (!) 103 (!) 119  Resp: 16 16  Temp: 98.9 F (37.2 C) 99.3 F (37.4 C)  SpO2: 98% 99%     Body mass index is 25.71 kg/m.    ECOG FS:2 - Symptomatic, <50% confined to bed  General: Well-developed, well-nourished, no acute distress. Eyes: Pink conjunctiva, anicteric sclera. HEENT: Normocephalic, moist mucous membranes. Lungs: No audible wheezing or coughing. Heart: Regular rate and rhythm. Abdomen: Soft, nontender, no obvious distention. Musculoskeletal: No edema, cyanosis, or clubbing. Neuro: Alert, answering all questions appropriately. Cranial nerves grossly intact. Skin: No rashes or petechiae noted. Psych: Normal affect.  LAB RESULTS:  Lab Results  Component Value Date   NA 129 (L) 11/05/2021   K 4.5 11/05/2021   CL 91 (L) 11/05/2021   CO2 28 11/05/2021   GLUCOSE 126 (H) 11/05/2021   BUN 9 11/05/2021   CREATININE 0.65 11/05/2021   CALCIUM 8.7 (L) 11/05/2021   PROT 6.8 11/03/2021   ALBUMIN 2.9 (L) 11/03/2021   AST 23 11/03/2021   ALT 29 11/03/2021   ALKPHOS 105 11/03/2021   BILITOT 0.2 (L) 11/03/2021   GFRNONAA >60 11/05/2021   GFRAA >60 04/24/2020    Lab Results  Component Value Date   WBC 7.7 11/05/2021   NEUTROABS 9.2 (H) 11/03/2021   HGB 9.5 (L) 11/05/2021   HCT 28.5 (L) 11/05/2021   MCV 90.5 11/05/2021   PLT 324 11/05/2021     STUDIES: DG Chest [facility]  Result Date: 11/03/2021 CLINICAL DATA:  Increasing weakness, lethargy, history of right-sided non-small cell lung cancer EXAM: PORTABLE CHEST 1 VIEW COMPARISON:  09/21/2021, 07/20/2021 FINDINGS: Single frontal view of the chest demonstrates stable right chest wall port. Cardiac silhouette is unremarkable. There is progressive consolidation within the right upper lobe, with areas of central cavitation.  Increased interstitial prominence throughout the right lung. Stable background emphysema. No effusion or pneumothorax. No acute bony abnormality. IMPRESSION: 1. Progressive right upper lobe consolidation with central cavitation, with asymmetric increased right-sided interstitial prominence. Overall, findings are concerning for progression of malignancy, with possible lymphangitic spread of disease. Cavitating infection or post radiation change could also be considered. Electronically Signed   By: Randa Ngo M.D.   On: 11/03/2021 22:09   CT OUTSIDE FILMS BODY/ABD/PELVIS  Result Date: 11/04/2021 : REVIEW OF OUTSIDE IMAGING - NO PATIENT CHARGE Today's Date: 11/04/2021 Patient Name: MISTER, KRAHENBUHL Patient DOB: 1956/10/03 Requesting Provider: Delight Hoh I was asked to review a chest CT with contrast dated November 03, 2021 on the above named patient. Imaging studies reviewed: Prior chest CT performed at this institution on July 20, 2021 was also reviewed for comparison. My response is as follows: Cardiovascular: Normal heart size. No pericardial effusion. Three-vessel coronary artery calcifications. Thoracic aorta is normal in caliber with moderate atherosclerotic disease. Right chest wall port with tip in the SVC. Mediastinum/nodes: Mildly enlarged right hilar lymph node measuring 1.0 cm on image 73, previously measured 0.7 cm, possibly reactive. Esophagus is unremarkable. Lungs/pleural: Central airways are patent. Centrilobular emphysema. Increased consolidation of the posterior right upper lobe which is contiguous with previously seen cavitary right upper lobe mass which extends into the superior portion of the right lower lobe and along the right hilum. Consolidation measures approximately 8.8 x 5.6 (axial) 14.5 cm (craniocaudal). Numerous new small solid nodules are seen throughout the right lung, but most prominent in the right upper lobe and right middle lobe. New juxtapleural spiculated nodule is  seen in the right upper lobe which measures 1.2 x 1.0 cm on series 5, image 49. New spiculated nodule of the right upper lobe located adjacent to the mediastinum measuring 0.9 x 0.7 cm on image 52. Stable solid nodule of the left lung apex measuring 6 mm on series 5, image 39. Upper abdomen: Low-attenuation liver lesions which are likely simple cysts. No acute abnormality. Musculoskeletal: Old left anterior rib fractures are less conspicuous when compared with prior exam, compatible with interval healing. No aggressive osseous lesions. IMPRESSION: 1. Increased masslike consolidation of the posterior right upper lobe which is contiguous with previously seen cavitary right upper lobe mass and now extends into the superior portion of the right lower lobe and along the right hilum, findings compatible with progression of lung malignancy. 2. New spiculated solid nodules are seen in the right upper lobe which are concerning for additional sites of malignancy. 3. Numerous new small solid nodules are seen throughout the right lung, but most prominent in the right upper lobe and right middle lobe. Findings are likely due to superimposed infection or aspiration. 4. Mildly enlarged right hilar lymph node, which could be reactive or due to metastatic disease. Disclaimer: This information was not provided in response to a formal consult. This response was not provided to render treatment or advise a treatment plan for this patient, as I did not have access to all of this patient's medical information and did not have all of the facts for this current episode of care. Electronically Signed  By: Yetta Glassman M.D.   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  PLAN:    Recurrent non-small cell carcinoma of the lung: Appreciate read from radiology of CT scan completed at [facility].  Reviewed independently and reported as above with likely progression of disease in patient's right lung.   Although patient's performance status is significantly declined, he has expressed interest in pursuing salvage chemotherapy.  Upon discharge, will get PET scan to complete staging work-up.  Also plan to discuss with pulmonology to assess whether biopsy is necessary. SIADH: Appreciate nephrology input.  Sodium levels have improved to 129.  Patient reports he has follow-up as an outpatient on Monday. Anemia: Chronic and unchanged.  Patient's hemoglobin is 9.5. Disposition: Okay to discharge from an oncology standpoint.  Will arrange follow-up in the cancer center next week.  Appreciate consult, will follow.  Patient's entire family was present during conversation.    [name], MD   11/05/2021 5:04 PM

## 2021-11-05 NOTE — TOC Transition Note (Signed)
Transition of Care [facility]) - CM/SW Discharge Note   Patient Details  Name: Paul Carpenter MRN: [id number] Date of Birth: 10-25-1956  Transition of Care [facility]) CM/SW Contact:  Candie Chroman, LCSW Phone Number: 11/05/2021, 4:19 PM   Clinical Narrative:   Patient has orders to discharge home today. He is agreeable to home health PT. Set up with [facility]. Asked them to call wife once they find out if he'll have copays or not. No DME recommendations. No further concerns. CSW signing off.  Final next level of care: [place] Barriers to Discharge: No Barriers Identified   Patient Goals and CMS Choice     Choice offered to / list presented to : Patient, Spouse  Discharge Placement                Patient to be transferred to facility by: Wife will take him home Name of family member notified: Rube Sanchez Patient and family notified of of transfer: 11/05/21  Discharge Plan and Services     Post Acute Care Choice: [place] Arranged: PT [place]: [facility] Agency Contacted: 11/05/21   Representative spoke with at [place]: Theophilus Kinds  Social Determinants of Health (SDOH) Interventions     Readmission Risk Interventions No flowsheet data found.

## 2021-11-05 NOTE — Evaluation (Signed)
Physical Therapy Evaluation Patient Details Name: Paul Carpenter MRN: [id number] DOB: 13-Oct-1956 Today's Date: 11/05/2021  History of Present Illness  Paul Carpenter is a 40yoM who comes to [facility] on 11/03/21 from Cancer center c Na+: 122. Pt reports ongoing weakness, lethargy. Pt reports syncope fall 10/28/21 with C2 fracture. Son reported 3 similar syncopal events in the past week. Patient was seen in the ER at [facility] on 10/28/2021 after having a fall.  Patient was found to have an endplate C2 fracture and was placed in hard c-collar by neurosurgery at [facility] "for 10 weeks". PMH: COPD c noncompliance O2, OSA c CPAP, recurrent Stg 3a LungCA. Neurosurgical consulted this admission to clarify need for hard collar due to confliciting patient report.  Clinical Impression  Pt admitted with above diagnosis. Pt currently with functional limitations due to the deficits listed below (see "PT Problem List"). Upon entry, pt in bed, awake and agreeable to participate. The pt is alert, pleasant, interactive, and able to provide info regarding prior level of function, both in tolerance and independence. Pt has already AMB unit with OT recently, but still waiting on hard cervical collar, hence no additional AMB pursued due to (+) orthostatic vitals and presyncope within this context. Pt moves well with bed mobility and transfers, balance during standing for vitals assessment, has some presyncope feelings which are vague in description but akin to prior syncope at home. Pt educated on chronic and/or fluctuating nature of orthostasis and tendency for symptoms to decrease over time- emphasized daily monitoring and additional safety measures for mobility at home, particularly use of hard C collar when up and mobile. Patient's performance this date reveals decreased ability, independence, and tolerance in performing all basic mobility required for performance of activities of daily living. Pt requires additional DME, close  physical assistance, and cues for safe participate in mobility. Pt will benefit from skilled PT intervention to increase independence and safety with basic mobility in preparation for discharge to the venue listed below. MD and oncology made aware of vitals, as pt reports oncology may already be aware of some new tachycardia issues s/p CA treatments.    Orthostatic VS for the past 24 hrs (Last 3 readings):  BP- Lying Pulse- Lying BP- Sitting Pulse- Sitting BP- Standing at 0 minutes Pulse- Standing at 0 minutes BP- Standing at 3 minutes Pulse- Standing at 3 minutes  11/05/21 1059 130/71 122 136/79 129 92/56 117 (!) 88/65 143          Recommendations for follow up therapy are one component of a multi-disciplinary discharge planning process, led by the attending physician.  Recommendations may be updated based on patient status, additional functional criteria and insurance authorization.  Follow Up Recommendations Home health PT    Assistance Recommended at Discharge Intermittent Supervision/Assistance  Patient can return home with the following       Equipment Recommendations None recommended by PT  Recommendations for Other Services       Functional Status Assessment Patient has had a recent decline in their functional status and demonstrates the ability to make significant improvements in function in a reasonable and predictable amount of time.     Precautions / Restrictions Precautions Precautions: Fall Required Braces or Orthoses: Cervical Brace Cervical Brace: Hard collar;Other (comment) (per Izora Ribas, can remove when in bed, or wear soft collar in bed)      Mobility  Bed Mobility Overal bed mobility: Modified Independent  Transfers Overall transfer level: Needs assistance Equipment used: Rolling walker (2 wheels) Transfers: Sit to/from Stand;Bed to chair/wheelchair/BSC Sit to Stand: Supervision;Min guard   Step pivot transfers: Supervision        General transfer comment: orthostatic hypotension with drop at 0 min and 1 min, slight correction at 2 minute; tachycardia progresses with each assessment, presyncope inititally with subsequent dyspnea at 2 minutes;    Ambulation/Gait   Gait Distance (Feet):  (defered. No hard collar in room. Pt just walked with OT >260ft)              Stairs            Wheelchair Mobility    Modified [place] (Stroke Patients Only)       Balance Overall balance assessment: Modified Independent;History of Falls Sitting-balance support: Feet supported Sitting balance-Leahy Scale: Good     Standing balance support: During functional activity;Reliant on assistive device for balance Standing balance-Leahy Scale: Fair                               Pertinent Vitals/Pain Pain Assessment: No/denies pain    Home Living Family/patient expects to be discharged to:: Private residence Living Arrangements: Spouse/significant other Available Help at Discharge: Family;Available PRN/intermittently Type of Home: House Home Access: Stairs to enter   Entrance Stairs-Number of Steps: 2   Home Layout: One level Home Equipment: Other (comment);Shower seat;Tub bench      Prior Function Prior Level of Function : Independent/Modified Independent             Mobility Comments: has recently finished significant CA treatments; Pt has been having syncope/presyncope issues at home starting in December 2022.       Hand Dominance   Dominant Hand: Right    Extremity/Trunk Assessment   Upper Extremity Assessment Upper Extremity Assessment: Generalized weakness;Overall [employer] for tasks assessed    Lower Extremity Assessment Lower Extremity Assessment: Defer to PT evaluation       Communication   Communication: No difficulties  Cognition Arousal/Alertness: Awake/alert Behavior During Therapy: WFL for tasks assessed/performed Overall Cognitive Status: Within Functional Limits  for tasks assessed                                 General Comments: Pt is A & O x4 and very pleasant.        General Comments      Exercises     Assessment/Plan    PT Assessment Patient needs continued PT services  PT Problem List Decreased activity tolerance;Decreased balance;Decreased safety awareness;Decreased knowledge of precautions;Decreased mobility       PT Treatment Interventions DME instruction;Balance training;Gait training;Stair training;Functional mobility training;Therapeutic activities;Therapeutic exercise;Patient/family education    PT Goals (Current goals can be found in the Care Plan section)  Acute Rehab PT Goals Patient Stated Goal: be safe at home, stop falling PT Goal Formulation: With patient Time For Goal Achievement: 11/19/21 Potential to Achieve Goals: Good    Frequency Min 2X/week     Co-evaluation               AM-PAC PT "6 Clicks" Mobility  Outcome Measure Help needed turning from your back to your side while in a flat bed without using bedrails?: A Little Help needed moving from lying on your back to sitting on the side of a flat bed without using bedrails?: A Little  Help needed moving to and from a bed to a chair (including a wheelchair)?: A Little Help needed standing up from a chair using your arms (e.g., wheelchair or bedside chair)?: A Little Help needed to walk in hospital room?: A Little Help needed climbing 3-5 steps with a railing? : A Little 6 Click Score: 18    End of Session Equipment Utilized During Treatment: Cervical collar (soft collar) Activity Tolerance: Patient tolerated treatment well;No increased pain;Treatment limited secondary to medical complications (Comment) Patient left: in chair;with family/visitor present;with call bell/phone within reach Nurse Communication: Mobility status;Precautions PT Visit Diagnosis: Unsteadiness on feet (R26.81);Difficulty in walking, not elsewhere classified  (R26.2);Other abnormalities of gait and mobility (R26.89);Muscle weakness (generalized) (M62.81);History of falling (Z91.81);Other symptoms and signs involving the nervous system (R29.898)    Time: 9432-0037 PT Time Calculation (min) (ACUTE ONLY): 32 min   Charges:   PT Evaluation $PT Eval High Complexity: 1 High PT Treatments $Self Care/Home Management: 8-22       1:33 PM, 11/05/21 Etta Grandchild, PT, DPT Physical Therapist - [facility]  [phone] ([place])    Deano Tomaszewski C 11/05/2021, 1:28 PM

## 2021-11-05 NOTE — Progress Notes (Signed)
°   11/05/21 1306  Assess: MEWS Score  Temp 99.3 F (37.4 C)  BP 113/86  Pulse Rate (!) 119  Resp 16  SpO2 99 %  O2 Device Room Air  Assess: MEWS Score  MEWS Temp 0  MEWS Systolic 0  MEWS Pulse 2  MEWS RR 0  MEWS LOC 0  MEWS Score 2  MEWS Score Color Yellow  Assess: if the MEWS score is Yellow or Red  Were vital signs taken at a resting state? Yes  Focused Assessment No change from prior assessment  Does the patient meet 2 or more of the SIRS criteria? No  Does the patient have a confirmed or suspected source of infection? No  MEWS guidelines implemented *See Row Information* Yes  Take Vital Signs  Increase Vital Sign Frequency  Yellow: Q 2hr X 2 then Q 4hr X 2, if remains yellow, continue Q 4hrs  Escalate  MEWS: Escalate Yellow: discuss with charge nurse/RN and consider discussing with provider and RRT  Notify: Charge Nurse/RN  Name of Charge Nurse/RN Notified Denice Paradise  Date Charge Nurse/RN Notified 11/05/21  Time Charge Nurse/RN Notified 1315  Notify: Provider  Provider Name/Title Dr. Darrick Meigs  Date Provider Notified 11/05/21  Time Provider Notified 1309  Notification Type Page  Notification Reason Other (Comment) (MEWS)  Provider response See new orders  Date of Provider Response 11/05/21  Time of Provider Response 1355  Document  Patient Outcome Stabilized after interventions  Progress note created (see row info) Yes  Assess: SIRS CRITERIA  SIRS Temperature  0  SIRS Pulse 1  SIRS Respirations  0  SIRS WBC 0  SIRS Score Sum  1

## 2021-11-06 ENCOUNTER — Telehealth: Payer: Self-pay | Admitting: *Deleted

## 2021-11-06 ENCOUNTER — Other Ambulatory Visit: Payer: Self-pay

## 2021-11-06 ENCOUNTER — Other Ambulatory Visit: Payer: Self-pay | Admitting: Emergency Medicine

## 2021-11-06 ENCOUNTER — Inpatient Hospital Stay: Payer: BC Managed Care – PPO

## 2021-11-06 DIAGNOSIS — Z5111 Encounter for antineoplastic chemotherapy: Secondary | ICD-10-CM | POA: Diagnosis not present

## 2021-11-06 DIAGNOSIS — E871 Hypo-osmolality and hyponatremia: Secondary | ICD-10-CM

## 2021-11-06 LAB — BASIC METABOLIC PANEL
Anion gap: 9 (ref 5–15)
BUN: 14 mg/dL (ref 8–23)
CO2: 26 mmol/L (ref 22–32)
Calcium: 8.5 mg/dL — ABNORMAL LOW (ref 8.9–10.3)
Chloride: 91 mmol/L — ABNORMAL LOW (ref 98–111)
Creatinine, Ser: 0.86 mg/dL (ref 0.61–1.24)
GFR, Estimated: >60 mL/min (ref >60)
Glucose, Bld: 123 mg/dL — ABNORMAL HIGH (ref 70–99)
Potassium: 4.7 mmol/L (ref 3.5–5.1)
Sodium: 126 mmol/L — ABNORMAL LOW (ref 135–145)

## 2021-11-06 NOTE — Telephone Encounter (Signed)
She is asking to have labs checked first before going to ER so that they know he in fact needs to be in ER

## 2021-11-06 NOTE — Telephone Encounter (Signed)
Returned call to Robin(wife) after speaking with Dr. Grayland Ormond. Bmet order placed to check sodium level with the understanding from patient and wife that the results will managed by nephrology not oncology providers. Pt also informed per Dr. Grayland Ormond that the cancer has NOT spread to the left lung. Pt and wife verbalized understanding.

## 2021-11-06 NOTE — Telephone Encounter (Signed)
Paul Carpenter called reporting that patient is lethargic and off mentally and states that his Lasix was discontinued abruptly and she feels his sodium level has dropped again. She is asking for [place] to order a stat sodium level because [facility] did not have an order to do it and Nephrology told her to contact [place] or [place] if this occurred Please advise

## 2021-11-09 ENCOUNTER — Other Ambulatory Visit
Admission: RE | Admit: 2021-11-09 | Discharge: 2021-11-09 | Disposition: A | Discharge status: Home / Self Care | Payer: BC Managed Care – PPO | Attending: Nephrology | Admitting: Nephrology

## 2021-11-09 DIAGNOSIS — E871 Hypo-osmolality and hyponatremia: Secondary | ICD-10-CM | POA: Insufficient documentation

## 2021-11-09 LAB — BASIC METABOLIC PANEL
Anion gap: 11 (ref 5–15)
BUN: 11 mg/dL (ref 8–23)
CO2: 25 mmol/L (ref 22–32)
Calcium: 8.8 mg/dL — ABNORMAL LOW (ref 8.9–10.3)
Chloride: 90 mmol/L — ABNORMAL LOW (ref 98–111)
Creatinine, Ser: 0.62 mg/dL (ref 0.61–1.24)
GFR, Estimated: >60 mL/min (ref >60)
Glucose, Bld: 113 mg/dL — ABNORMAL HIGH (ref 70–99)
Potassium: 4.6 mmol/L (ref 3.5–5.1)
Sodium: 126 mmol/L — ABNORMAL LOW (ref 135–145)

## 2021-11-10 ENCOUNTER — Telehealth: Payer: Self-pay | Admitting: Emergency Medicine

## 2021-11-10 ENCOUNTER — Other Ambulatory Visit: Payer: Self-pay | Admitting: Emergency Medicine

## 2021-11-10 DIAGNOSIS — C3491 Malignant neoplasm of unspecified part of right bronchus or lung: Secondary | ICD-10-CM

## 2021-11-10 NOTE — Telephone Encounter (Signed)
Spoke with pt to let him know that the requested US guided biopsy ordered. While on the phone pt requested MRI of the brain, neuro consult for a "fractured T2 in the neck" and possibly a bronchoscopy. MD aware of pt concerns.

## 2021-11-10 NOTE — Telephone Encounter (Signed)
Spoke with patient and he was informed that PET scan would be scheduled as soon as prior authorization is received. Discussed with him that there is no need for biopsy as the cancer has not spread to other lung. Pt verbalized understanding

## 2021-11-13 ENCOUNTER — Other Ambulatory Visit: Payer: Self-pay

## 2021-11-13 ENCOUNTER — Ambulatory Visit
Admission: RE | Admit: 2021-11-13 | Discharge: 2021-11-13 | Disposition: A | Discharge status: Home / Self Care | Payer: BC Managed Care – PPO | Source: Ambulatory Visit | Attending: Family Medicine | Admitting: Family Medicine

## 2021-11-13 DIAGNOSIS — R110 Nausea: Secondary | ICD-10-CM | POA: Insufficient documentation

## 2021-11-13 DIAGNOSIS — C3491 Malignant neoplasm of unspecified part of right bronchus or lung: Secondary | ICD-10-CM | POA: Diagnosis present

## 2021-11-13 DIAGNOSIS — E871 Hypo-osmolality and hyponatremia: Secondary | ICD-10-CM | POA: Insufficient documentation

## 2021-11-13 MED ORDER — IOHEXOL 300 MG/ML  SOLN
100.0000 mL | Freq: Once | INTRAMUSCULAR | Status: AC | PRN
  Administered 2021-11-13: 100 mL via INTRAVENOUS

## 2021-11-16 ENCOUNTER — Ambulatory Visit
Admission: RE | Admit: 2021-11-16 | Discharge: 2021-11-16 | Disposition: A | Discharge status: Home / Self Care | Payer: BC Managed Care – PPO | Source: Ambulatory Visit | Attending: Oncology | Admitting: Oncology

## 2021-11-16 DIAGNOSIS — I700 Atherosclerosis of aorta: Secondary | ICD-10-CM | POA: Diagnosis not present

## 2021-11-16 DIAGNOSIS — I2510 Atherosclerotic heart disease of native coronary artery without angina pectoris: Secondary | ICD-10-CM | POA: Diagnosis not present

## 2021-11-16 DIAGNOSIS — J439 Emphysema, unspecified: Secondary | ICD-10-CM | POA: Diagnosis present

## 2021-11-16 DIAGNOSIS — I6523 Occlusion and stenosis of bilateral carotid arteries: Secondary | ICD-10-CM | POA: Insufficient documentation

## 2021-11-16 DIAGNOSIS — C3491 Malignant neoplasm of unspecified part of right bronchus or lung: Secondary | ICD-10-CM | POA: Insufficient documentation

## 2021-11-16 LAB — GLUCOSE, CAPILLARY: Glucose-Capillary: 81 mg/dL (ref 70–99)

## 2021-11-16 MED ORDER — FLUDEOXYGLUCOSE F - 18 (FDG) INJECTION
9.8400 | Freq: Once | INTRAVENOUS | Status: AC | PRN
  Administered 2021-11-16: 9.84 via INTRAVENOUS

## 2021-11-17 ENCOUNTER — Ambulatory Visit: Payer: BC Managed Care – PPO | Admitting: Urology

## 2021-11-17 ENCOUNTER — Telehealth: Payer: Self-pay | Admitting: *Deleted

## 2021-11-17 ENCOUNTER — Telehealth: Payer: Self-pay | Admitting: Oncology

## 2021-11-17 NOTE — Telephone Encounter (Signed)
Wife called stating that pt had his PET scan done on 1-16 and she wants to know where does she go from there. Call back at [phone]

## 2021-11-17 NOTE — Telephone Encounter (Signed)
Call from Paul Carpenter stating that they have seen the PET and CT results and is asking what is next does he see Dr Baruch Gouty or Dr Grayland Ormond. His next appointment in Med Onc and radiation therapy  is 12/30/21. Please advise.  PET IMPRESSION: 1. Similar size of a cavitary process in the right upper lobe compared to 10/28/2021. Increased size and hypermetabolism compared to the PET of 06/02/2021. Morphology could represent chronic atypical infection and/or neoplasm. 2. Hypermetabolic pulmonary nodules, many of which are new compared to the CT of 10/28/2021. Especially given the more diffuse micronodularity, these are favored to be infectious/inflammatory. 3. No evidence of hypermetabolic extrathoracic metastasis. 4. Aortic atherosclerosis (ICD10-I70.0), coronary artery atherosclerosis and emphysema (ICD10-J43.9).     Electronically Signed   By: Abigail Miyamoto M.D.   On: 11/16/2021 13:13  CT IMPRESSION: Multiple pulmonary nodules in the RIGHT middle and RIGHT lower lobes new since 06/02/2021 though seen on interval CT chest from December 2022, could be inflammatory or infectious though cannot exclude metastasis at any of these nodules.   Multiple hepatic cysts.   Tiny nonobstructing LEFT renal calculus.   Minimal prostatic enlargement.   No acute intra-abdominal or intrapelvic abnormalities.   Small amount of nonspecific free pelvic fluid.   Aortic Atherosclerosis (ICD10-I70.0).     Electronically Signed   By: Lavonia Dana M.D.   On: 11/13/2021 12:55

## 2021-11-17 NOTE — Telephone Encounter (Signed)
CAll returned to Paul Carpenter who accepts video visit tomorrow at 1115

## 2021-11-18 ENCOUNTER — Telehealth: Payer: Self-pay

## 2021-11-18 ENCOUNTER — Inpatient Hospital Stay (HOSPITAL_BASED_OUTPATIENT_CLINIC_OR_DEPARTMENT_OTHER): Payer: BC Managed Care – PPO | Admitting: Oncology

## 2021-11-18 DIAGNOSIS — C3491 Malignant neoplasm of unspecified part of right bronchus or lung: Secondary | ICD-10-CM

## 2021-11-18 NOTE — Telephone Encounter (Signed)
Called and spoke to patient about if he was on a cpap machine. He states he is not however he would like Dr [name] to review his PET SCAN.

## 2021-11-19 ENCOUNTER — Encounter: Payer: Self-pay | Admitting: Internal Medicine

## 2021-11-19 ENCOUNTER — Other Ambulatory Visit: Payer: Self-pay

## 2021-11-19 ENCOUNTER — Encounter: Payer: Self-pay | Admitting: Oncology

## 2021-11-19 ENCOUNTER — Ambulatory Visit: Payer: BC Managed Care – PPO | Admitting: Internal Medicine

## 2021-11-19 ENCOUNTER — Other Ambulatory Visit: Payer: Self-pay | Admitting: Oncology

## 2021-11-19 VITALS — BP 100/62 | HR 107 | Temp 97.7°F | Ht 72.0 in | Wt 193.8 lb

## 2021-11-19 DIAGNOSIS — C3401 Malignant neoplasm of right main bronchus: Secondary | ICD-10-CM

## 2021-11-19 DIAGNOSIS — Z720 Tobacco use: Secondary | ICD-10-CM | POA: Diagnosis not present

## 2021-11-19 DIAGNOSIS — J449 Chronic obstructive pulmonary disease, unspecified: Secondary | ICD-10-CM

## 2021-11-19 DIAGNOSIS — C3491 Malignant neoplasm of unspecified part of right bronchus or lung: Secondary | ICD-10-CM

## 2021-11-19 MED ORDER — LEVALBUTEROL TARTRATE 45 MCG/ACT IN AERO: 1.0000 | INHALATION_SPRAY | Freq: Four times a day (QID) | RESPIRATORY_TRACT | 5 refills | Status: DC | PRN

## 2021-11-19 NOTE — Progress Notes (Signed)
DISCONTINUE ON PATHWAY REGIMEN - Non-Small Cell Lung     A cycle is every 14 days:     Durvalumab   **Always confirm dose/schedule in your pharmacy ordering system**  REASON: Disease Progression PRIOR TREATMENT: [id number]: Durvalumab 10 mg/kg q14 Days x up to 12 Months TREATMENT RESPONSE: Partial Response (PR)  START OFF PATHWAY REGIMEN - Non-Small Cell Lung   [id number]:Docetaxel 75 mg/m2:   A cycle is every 21 days:     Docetaxel   **Always confirm dose/schedule in your pharmacy ordering system**  Patient Characteristics: Local Recurrence Therapeutic Status: Local Recurrence Intent of Therapy: Non-Curative / Palliative Intent, Discussed with Patient

## 2021-11-19 NOTE — Progress Notes (Signed)
[place]  [phone] [phone]) [phone]  ID: Paul Carpenter OB: 1956/03/28  MR#: [id number]  YYT#:[id number]  Patient Care Team: Peggye Form, NP as PCP - General (Family Medicine) Lucky Cowboy, Erskine Squibb, MD as Referring Physician (Vascular Surgery) [name], MD as Consulting Physician (Oncology) [name], MD as Referring Physician (Radiation Oncology)  CHIEF COMPLAINT: Recurrent stage IIIa non-small cell lung cancer, right middle lobe.  INTERVAL HISTORY: Patient returns to clinic today for further evaluation and initiation of cycle 1 of 4 of palliative Taxotere.  He continues to be anxious.  He has mild neck pain secondary to his C2 cervical fracture from a fall, but this is improving.  He continues to have chronic weakness and fatigue. He continues to have chronic cough.  He continues to smoke.  He has no neurologic complaints.  He denies any fevers.  He denies any chest pain, shortness of breath, or hemoptysis. He denies any vomiting, constipation, or diarrhea. He has no urinary complaints.  Patient offers no further specific complaints today.  REVIEW OF SYSTEMS:   Review of Systems  Constitutional:  Positive for malaise/fatigue. Negative for fever and weight loss.  HENT: Negative.  Negative for congestion and sore throat.   Respiratory:  Positive for cough. Negative for hemoptysis and shortness of breath.   Cardiovascular: Negative.  Negative for chest pain and leg swelling.  Gastrointestinal: Negative.  Negative for abdominal pain, blood in stool, melena and nausea.  Genitourinary: Negative.  Negative for dysuria.  Musculoskeletal:  Positive for neck pain. Negative for joint pain.  Skin: Negative.  Negative for rash.  Neurological:  Positive for weakness. Negative for dizziness, sensory change, focal weakness and headaches.  Psychiatric/Behavioral:  The patient is nervous/anxious.    As per HPI. Otherwise, a complete review of systems  is negative.  PAST MEDICAL HISTORY: Past Medical History:  Diagnosis Date   Anginal pain ([place])    Anxiety    Asthma    Chest pain    CHF (congestive heart failure) (HCC)    Chicken pox    Complication of anesthesia    o2 dropped after neck fusion   COPD (chronic obstructive pulmonary disease) (HCC)    Coronary artery disease    Cough    chronic  clear phlegm   Dysrhythmia    palpitations   GERD (gastroesophageal reflux disease)    h/o reflux/ hoarsness   Hematochezia    Hemorrhoids    History of chickenpox    History of colon polyps    History of Helicobacter pylori infection    Hoarseness    Hypertension    Lung cancer ([place]) 05/2016   Chemo + rad tx's.    Migraines    OSA (obstructive sleep apnea)    has CPAP but does not use   Personal history of tobacco use, presenting hazards to health 03/05/2016   Pneumonia    5/17   Raynaud disease    Raynaud disease    Raynaud's disease    Rotator cuff tear    on right   Shortness of breath dyspnea    Sleep apnea    Ulcer (traumatic) of oral mucosa     PAST SURGICAL HISTORY: Past Surgical History:  Procedure Laterality Date   BACK SURGERY     cervical fusion x 2   CARDIAC CATHETERIZATION     CERVICAL DISCECTOMY     x 2   COLONOSCOPY     COLONOSCOPY N/A 07/25/2015   Procedure:  COLONOSCOPY;  Surgeon: Lollie Sails, MD;  Location: [facility] ENDOSCOPY;  Service: Endoscopy;  Laterality: N/A;   COLONOSCOPY WITH PROPOFOL N/A 10/04/2017   Procedure: COLONOSCOPY WITH PROPOFOL;  Surgeon: Lollie Sails, MD;  Location: [facility] ENDOSCOPY;  Service: Endoscopy;  Laterality: N/A;   COLONOSCOPY WITH PROPOFOL N/A 07/10/2020   Procedure: COLONOSCOPY WITH PROPOFOL;  Surgeon: Toledo, Benay Pike, MD;  Location: [facility] ENDOSCOPY;  Service: Gastroenterology;  Laterality: N/A;   ELECTROMAGNETIC NAVIGATION BROCHOSCOPY Left 06/28/2016   Procedure: ELECTROMAGNETIC NAVIGATION BRONCHOSCOPY;  Surgeon: Vilinda Boehringer, MD;  Location: [facility] ORS;  Service:  Cardiopulmonary;  Laterality: Left;   ENDOBRONCHIAL ULTRASOUND N/A 04/11/2018   Procedure: ENDOBRONCHIAL ULTRASOUND;  Surgeon: [name], MD;  Location: [facility] ORS;  Service: Cardiopulmonary;  Laterality: N/A;   ESOPHAGOGASTRODUODENOSCOPY N/A 07/25/2015   Procedure: ESOPHAGOGASTRODUODENOSCOPY (EGD);  Surgeon: Lollie Sails, MD;  Location: [facility] ENDOSCOPY;  Service: Endoscopy;  Laterality: N/A;   ESOPHAGOGASTRODUODENOSCOPY (EGD) WITH PROPOFOL N/A 07/10/2020   Procedure: ESOPHAGOGASTRODUODENOSCOPY (EGD) WITH PROPOFOL;  Surgeon: Toledo, Benay Pike, MD;  Location: [facility] ENDOSCOPY;  Service: Gastroenterology;  Laterality: N/A;   NASAL SINUS SURGERY     x 2    PORTA CATH INSERTION N/A 04/24/2018   Procedure: PORTA CATH INSERTION;  Surgeon: Algernon Huxley, MD;  Location: [place] [facility] LAB;  Service: Cardiovascular;  Laterality: N/A;   rotator cuff surgery Right    07/2016   SEPTOPLASTY     SKIN GRAFT      FAMILY HISTORY Family History  Problem Relation Age of Onset   Heart disease Father    Prostate cancer Father    Heart disease Paternal Grandmother    Heart attack Maternal Grandfather 37   Kidney cancer Neg Hx    Bladder Cancer Neg Hx    Other Neg Hx        pituitary abnormality       ADVANCED DIRECTIVES:    HEALTH MAINTENANCE: Social History   Tobacco Use   Smoking status: Every Day    Packs/day: 3.00    Years: 50.00    Pack years: 150.00    Types: Cigarettes   Smokeless tobacco: Never   Tobacco comments:    1.5-2 PPD 11/19/2021  Vaping Use   Vaping Use: Never used  Substance Use Topics   Alcohol use: Yes    Alcohol/week: 2.0 standard drinks    Types: 2 Standard drinks or equivalent per week    Comment: moderate   Drug use: No     Allergies  Allergen Reactions   Lisinopril Rash   Varenicline Rash    Current Outpatient Medications  Medication Sig Dispense Refill   acetaminophen (TYLENOL) 500 MG tablet Take 500 mg by mouth daily as needed.     albuterol  (PROAIR HFA) 108 (90 Base) MCG/ACT inhaler Inhale 2 puffs into the lungs every 4 (four) hours as needed for wheezing or shortness of breath. 1 Inhaler 1   ALPRAZolam (XANAX) 0.25 MG tablet Take 0.25 mg by mouth 2 (two) times daily as needed.     atorvastatin (LIPITOR) 40 MG tablet Take 40 mg by mouth at bedtime.      ferrous sulfate 325 (65 FE) MG tablet Take 325 mg by mouth at bedtime.     Fluticasone-Umeclidin-Vilant (TRELEGY ELLIPTA) 200-62.5-25 MCG/INH AEPB Inhale 200 mcg into the lungs daily. 28 each 0   ipratropium-albuterol (DUONEB) 0.5-2.5 (3) MG/3ML SOLN Take 3 mLs by nebulization every 6 (six) hours as needed. 360 mL 5   levalbuterol (XOPENEX  HFA) 45 MCG/ACT inhaler Inhale 1 puff into the lungs every 6 (six) hours as needed for wheezing. 1 each 5   levothyroxine (SYNTHROID) 150 MCG tablet Take 150 mcg by mouth daily before breakfast.     lidocaine-prilocaine (EMLA) cream Apply 1 application topically as needed. 30 g 1   magnesium oxide (MAG-OX) 400 MG tablet Take 600 mg by mouth 2 (two) times daily. Takes 1.5 tablet twice daily     metoprolol succinate (TOPROL-XL) 25 MG 24 hr tablet Take 12.5 mg by mouth daily.     midodrine (PROAMATINE) 5 MG tablet as needed.     nitroGLYCERIN (NITROSTAT) 0.4 MG SL tablet Place under the tongue.     OXYGEN Inhale 2 L into the lungs at bedtime.     pantoprazole (PROTONIX) 40 MG tablet Take 40 mg by mouth 2 (two) times daily.     potassium chloride (KLOR-CON) 10 MEQ tablet Take 1 tablet by mouth daily.     prochlorperazine (COMPAZINE) 10 MG tablet Take 1 tablet (10 mg total) by mouth every 6 (six) hours as needed for nausea or vomiting. 60 tablet 3   sodium chloride 1 g tablet Take 1 tablet (1 g total) by mouth 3 (three) times daily with meals. 90 tablet 1   ondansetron (ZOFRAN) 8 MG tablet Take 1 tablet (8 mg total) by mouth every 8 (eight) hours as needed for nausea or vomiting. 30 tablet 1   tadalafil (CIALIS) 5 MG tablet Take 1 tablet (5 mg total) by  mouth daily as needed for erectile dysfunction. (Patient not taking: Reported on 11/19/2021) 90 tablet 3   No current facility-administered medications for this visit.    OBJECTIVE: Vitals:   11/26/21 0938  BP: 129/80  Pulse: 100  Resp: 20  Temp: 98.7 F (37.1 C)  SpO2: 100%       Body mass index is 26.13 kg/m.    ECOG FS:1 - Symptomatic but completely ambulatory  General: Well-developed, well-nourished, no acute distress. Eyes: Pink conjunctiva, anicteric sclera. HEENT: Normocephalic, moist mucous membranes. Lungs: No audible wheezing or coughing. Heart: Regular rate and rhythm. Abdomen: Soft, nontender, no obvious distention. Musculoskeletal: No edema, cyanosis, or clubbing. Neuro: Alert, answering all questions appropriately. Cranial nerves grossly intact. Skin: No rashes or petechiae noted. Psych: Normal affect.   LAB RESULTS:  Lab Results  Component Value Date   NA 130 (L) 11/26/2021   K 3.9 11/26/2021   CL 92 (L) 11/26/2021   CO2 26 11/26/2021   GLUCOSE 104 (H) 11/26/2021   BUN 9 11/26/2021   CREATININE 0.57 (L) 11/26/2021   CALCIUM 9.0 11/26/2021   PROT 7.1 11/26/2021   ALBUMIN 3.2 (L) 11/26/2021   AST 16 11/26/2021   ALT 20 11/26/2021   ALKPHOS 86 11/26/2021   BILITOT 0.3 11/26/2021   GFRNONAA >60 11/26/2021   GFRAA >60 04/24/2020    Lab Results  Component Value Date   WBC 8.3 11/26/2021   NEUTROABS 7.1 11/26/2021   HGB 10.4 (L) 11/26/2021   HCT 31.2 (L) 11/26/2021   MCV 92.6 11/26/2021   PLT 212 11/26/2021   Lab Results  Component Value Date   IRON 32 (L) 11/20/2021   TIBC 277 11/20/2021   IRONPCTSAT 12 (L) 11/20/2021   Lab Results  Component Value Date   FERRITIN 318 11/20/2021     STUDIES:  ONCOLOGY HISTORY: Case discussed with pathologist and unable to determine whether this is adenocarcinoma or squamous cell carcinoma.  There is also insufficient tissue to  do further testing.  Liquid biopsy did not reveal any actionable  mutations.  MRI of the brain completed on Mar 28, 2018 reviewed independently did not reveal metastatic disease.  Patient completed XRT June 26, 2018.  He completed his concurrent single agent carboplatinum on June 21, 2018.  Patient had a reaction to Taxol during cycle 1 and this was discontinued.  He completed a year-long of maintenance durvalumab on June 27, 2019.   ASSESSMENT: Recurrent stage IIIa non-small cell lung cancer, right middle lobe.  PLAN:    1.  Recurrent stage IIIa non-small cell lung cancer, right middle lobe: See oncology history above.  PET scan results from November 16, 2021 reviewed independently with once again suspicion of progression of disease.  After lengthy discussion with the patient, it was agreed that no biopsy is necessary and may be too difficult given his recent C2 cervical fracture.  Patient and family have expressed interest in pursuing systemic chemotherapy and will proceed with single agent Taxotere every 3 weeks for 4 cycles with Udenyca support.  Proceed with cycle 1 of treatment today.  Return to clinic tomorrow for [place], and 1 week for laboratory work and further evaluation, and then in 3 weeks for consideration of cycle 2.  Will reimage with PET scan at the conclusion of cycle 4.   2.  Iron deficiency anemia: Improving.  Proceed with IV Venofer today.   3.  Colon polyps:  Patient has a personal history of greater then 10 adenomatous polyps on his most recent conoloscopy. He does not know of any family history of increased polyps or colon cancer.  Genetic testing to assess for the APC mutation for FAP or AFAP was negative. Continue colonoscopies as per GI. 4. Tobacco Use: Chronic and unchanged.  Patient continues to heavily smoke.  He previously expressed understanding by continuing tobacco use increases his chance of recurrence. 5. Anxiety: Chronic and unchanged.  Continue Valium 10 mg every 12 hours as needed.  Continue treatment and evaluation per  primary care.  IV Ativan has also been added to his premedication regimen. 6.  Hyponatremia: Improving.  Patient sodium level was 130 today.  Continue salt tablets as per nephrology.  7.  Hypothyroidism: Appreciate endocrinology input.  Continue Synthroid as prescribed.  Follow-up with endocrinology as scheduled. 8.  CHF: Patient has an EF of 40%.  Continue evaluation and treatment per cardiology. 9.  Nausea: Patient does not complain of this today.  Continue Zofran as needed. 10.  C2 cervical fracture: Continue follow-up with neurosurgery as scheduled.    Patient expressed understanding and was in agreement with this plan. He also understands that He can call clinic at any time with any questions, concerns, or complaints.    [name], MD   11/27/2021 9:34 AM

## 2021-11-19 NOTE — Progress Notes (Signed)
[place] [facility] Consultation      MRN# [id number] Paul Carpenter [birth year]     Brief History: Synopsis: Paul Carpenter first saw the [facility] pulmonary clinic in July of 2014 for COPD. Simple spirometry showed a ratio 62%, FEV1 of 2.41 L (60% predicted). He had smoked 2 packs of cigarettes daily since his teenage years and continues to smoke. He has had multiple sinus infections and 2 sinus surgeries in the past.. LUL lung lesion negative for malignancy based on ENB 06/2016. 04/2017 PFT's shows ratio 62 with Fev1 68% 2.6L with air trapping and hyperinflation  Mass of upper lobe of left lung ENB of LUL lesion negative for malignant cell in 06/2016. Repeat CT chest in 08/2016 with RUL small scarring lesion, this is new.  Had another CTA Chest 09/09/16 for severe dyspnea - no PE, but LUL PNA CT chest 03/2017- -Apical right upper lobe 5 mm pulmonary nodule stable back to 03/14/2015 - 6 mm apical right upper lobe pulmonary nodule stable since at least 03/22/2016.  -tree-in-bud type opacity in the anterior right middle lobe is stable since 08/23/2016 -Apical left upper lobe 9 x 5 mm  nodule is stable since at least 03/22/2016  - clinical monitoring at this time-follow up CT chest in 6-12 months  CT chest Mar 23, 2019  CT scan results from Mar 23, 2019 reviewed independently with stable disease and no evidence of recurrence or progression.    MRI of the brain completed on Mar 28, 2018 reviewed independently did not reveal metastatic disease CHF: Patient has an EF of 40%.  Continue evaluation and treatment per cardiology    CC: Follow-up COPD   HPI  Patient with diagnosis of recurrent lung cancer Undergoing chemotherapy starting this week  Smoking Assessment and Cessation Counseling Upon further questioning, Patient smokes 1 ppd I have advised patient to quit/stop smoking as soon as possible due to high risk for multiple medical problems  is NOT willing to  quit smoking  I have advised patient that we can assist and have options of Nicotine replacement therapy. I also advised patient on behavioral therapy and can provide oral medication therapy in conjunction with the other therapies Follow up next Office visit  for assessment of smoking cessation Smoking cessation counseling advised for 4 minutes  Continues to wear oxygen at nighttime Diagnosis of COPD On Trelegy inhaler therapy 200 Recurrent lung cancer on PET scan Will undergo chemotherapy  Patient has  C2 cervical fracture Unable to undergo any type of diagnostic biopsy  Smoking Assessment and Cessation Counseling Upon further questioning, Patient smokes 2 ppd I have advised patient to quit/stop smoking as soon as possible due to high risk for multiple medical problems  Patient  is NOT willing to quit smoking  I have advised patient that we can assist and have options of Nicotine replacement therapy. I also advised patient on behavioral therapy and can provide oral medication therapy in conjunction with the other therapies Follow up next Office visit  for assessment of smoking cessation Smoking cessation counseling advised for 4 minutes   History of cardiomyopathy with a EF 40%   Current Outpatient Medications:    acetaminophen (TYLENOL) 500 MG tablet, Take 500 mg by mouth daily as needed., Disp: , Rfl:    albuterol (PROAIR HFA) 108 (90 Base) MCG/ACT inhaler, Inhale 2 puffs into the lungs every 4 (four) hours as needed for wheezing or shortness of breath., Disp: 1 Inhaler, Rfl: 1   ALPRAZolam (XANAX) 0.25  MG tablet, Take 0.25 mg by mouth 2 (two) times daily as needed., Disp: , Rfl:    atorvastatin (LIPITOR) 40 MG tablet, Take 40 mg by mouth at bedtime. , Disp: , Rfl:    ferrous sulfate 325 (65 FE) MG tablet, Take 325 mg by mouth at bedtime., Disp: , Rfl:    Fluticasone-Umeclidin-Vilant (TRELEGY ELLIPTA) 200-62.5-25 MCG/INH AEPB, Inhale 200 mcg into the lungs daily., Disp: 28 each,  Rfl: 0   ipratropium-albuterol (DUONEB) 0.5-2.5 (3) MG/3ML SOLN, Take 3 mLs by nebulization every 6 (six) hours as needed., Disp: 360 mL, Rfl: 5   levothyroxine (SYNTHROID) 150 MCG tablet, Take 150 mcg by mouth daily before breakfast., Disp: , Rfl:    lidocaine-prilocaine (EMLA) cream, Apply 1 application topically as needed., Disp: 30 g, Rfl: 1   magnesium oxide (MAG-OX) 400 MG tablet, Take 600 mg by mouth 2 (two) times daily. Takes 1.5 tablet twice daily, Disp: , Rfl:    metoprolol succinate (TOPROL-XL) 25 MG 24 hr tablet, Take 12.5 mg by mouth daily., Disp: , Rfl:    midodrine (PROAMATINE) 5 MG tablet, as needed., Disp: , Rfl:    nitroGLYCERIN (NITROSTAT) 0.4 MG SL tablet, Place under the tongue., Disp: , Rfl:    ondansetron (ZOFRAN) 8 MG tablet, Take 1 tablet (8 mg total) by mouth every 8 (eight) hours as needed for nausea or vomiting., Disp: 30 tablet, Rfl: 1   OXYGEN, Inhale 2 L into the lungs at bedtime., Disp: , Rfl:    pantoprazole (PROTONIX) 40 MG tablet, Take 40 mg by mouth 2 (two) times daily., Disp: , Rfl:    potassium chloride (KLOR-CON) 10 MEQ tablet, Take 1 tablet by mouth daily., Disp: , Rfl:    sodium chloride 1 g tablet, Take 1 tablet (1 g total) by mouth 3 (three) times daily with meals., Disp: 90 tablet, Rfl: 1   tadalafil (CIALIS) 5 MG tablet, Take 1 tablet (5 mg total) by mouth daily as needed for erectile dysfunction. (Patient not taking: Reported on 11/19/2021), Disp: 90 tablet, Rfl: 3   Review of Systems:  Gen:  Denies  fever, sweats, chills weight loss  HEENT: Denies blurred vision, double vision, ear pain, eye pain, hearing loss, nose bleeds, sore throat Cardiac:  No dizziness, chest pain or heaviness, chest tightness,edema, No JVD Resp:   + cough, +sputum production, +shortness of breath,+wheezing, +hemoptysis,   Other:  All other systems negative   BP 100/62 (BP Location: Left Arm, Cuff Size: Normal)    Pulse (!) 107    Temp 97.7 F (36.5 C) (Temporal)    Ht 6'  (1.829 m)    Wt 193 lb 12.8 oz (87.9 kg)    SpO2 97%    BMI 26.28 kg/m   Physical Examination:   General Appearance: No distress  Neuro:without focal findings,  speech normal,  HEENT: PERRLA, EOM intact.   Pulmonary: normal breath sounds, +wheezing.  CardiovascularNormal S1,S2.  No m/r/g.    ALL OTHER ROS ARE NEGATIVE            Assessment and Plan:    66 year old pleasant white male seen today for follow-up assessment for severe COPD Gold stage D with chronic hypoxic respiratory failure from his COPD with a history of non-small cell lung cancer with new recurrence of stage IIIa non-small cell lung cancer in the right middle lobe with ongoing tobacco abuse undergoing systemic chemotherapy   Patient has ongoing shortness of breath and dyspnea exertion now with episodic hemoptysis with ongoing  tobacco abuse    Moderate/Severe  COPD Gold stage D Trelegy 200 triple therapy inhaler therapy  Continue levalbuterol as needed   Lung cancer Recurrent stage IIIa non-small cell lung cancer, right middle lobe: PET scan results from November 16, 2021 reviewed independently with once again suspicion of progression of disease.   no biopsy is necessary and may be too difficult given his recent C2 cervical fracture.  Patient and family have expressed interest in pursuing systemic chemotherapy and will proceed with single agent Taxotere every 3 weeks for 4 cycles with Udenyca support.   Smoking cessation strongly advised Patient continues to heavily smoke.  He previously expressed understanding by continuing tobacco use increases his chance of recurrence.   Ischemic cardiomyopathy with EF 40% Patient advised to follow-up with cardiology  Patient has dizziness and headaches Recommend following up with neurology as previously seen   Chronic Hypoxic resp failure due to COPD -Patient benefits from oxygen therapy 2L [place] -recommend using oxygen as prescribed  -patient needs this for  survival    OSA Patient refuses to use CPAP at night therefore is noncompliant   MEDICATION ADJUSTMENTS/LABS AND TESTS ORDERED: Trelegy INH ALB AS NEEDED PLEASE STOP SMOKING FOLLOW UP CHEMO/Oncology  CURRENT MEDICATIONS REVIEWED AT LENGTH WITH PATIENT TODAY   Follow up in 6 months  Total time spent 35 mins    Melany Wiesman [name], M.D.  [facility]  Medical Director [place] Director [facility] Cardio-Pulmonary Department             I

## 2021-11-19 NOTE — Progress Notes (Signed)
[place]  [phone] [phone]  ID: Paul Carpenter OB: [birth year]  MR#: [id number]  CNO#:[id number]  Patient Care Team: Peggye Form, NP as PCP - General (Family Medicine) [name], MD as Referring Physician (Vascular Surgery) [name], MD as Consulting Physician (Oncology) [name], MD as Referring Physician (Radiation Oncology)  I connected with Paul Carpenter on 11/19/21 at 11:15 AM EST by video enabled telemedicine visit and verified that I am speaking with the correct person using two identifiers.   I discussed the limitations, risks, security and privacy concerns of performing an evaluation and management service by telemedicine and the availability of in-person appointments. I also discussed with the patient that there may be a patient responsible charge related to this service. The patient expressed understanding and agreed to proceed.   Other persons participating in the visit and their role in the encounter: Patient, MD.  Patients location: Home. Providers location: Clinic.  CHIEF COMPLAINT: Recurrent stage IIIa non-small cell lung cancer, right middle lobe.  INTERVAL HISTORY: Patient agreed to video assisted telemedicine visit for further evaluation and treatment planning.  He continues to have neck pain secondary to his C2 cervical fracture from a fall, but this is improving.  He continues to have chronic weakness and fatigue.  He does not appear to have any symptoms related to his chronic hyponatremia.  He continues to be anxious.  He continues to have chronic cough.  He continues to smoke.  He has no neurologic complaints.  He denies any fevers.  He denies any chest pain, shortness of breath, or hemoptysis. He denies any vomiting, constipation, or diarrhea. He has no urinary complaints.  Patient offers no further specific complaints today.  REVIEW OF SYSTEMS:   Review of Systems  Constitutional:   Positive for malaise/fatigue. Negative for fever and weight loss.  HENT: Negative.  Negative for congestion and sore throat.   Respiratory:  Positive for cough. Negative for hemoptysis and shortness of breath.   Cardiovascular: Negative.  Negative for chest pain and leg swelling.  Gastrointestinal: Negative.  Negative for abdominal pain, blood in stool, melena and nausea.  Genitourinary: Negative.  Negative for dysuria.  Musculoskeletal:  Positive for neck pain. Negative for joint pain.  Skin: Negative.  Negative for rash.  Neurological:  Positive for weakness. Negative for dizziness, sensory change, focal weakness and headaches.  Psychiatric/Behavioral:  The patient is nervous/anxious.    As per HPI. Otherwise, a complete review of systems is negative.  PAST MEDICAL HISTORY: Past Medical History:  Diagnosis Date   Anginal pain ([place])    Anxiety    Asthma    Chest pain    CHF (congestive heart failure) (HCC)    Chicken pox    Complication of anesthesia    o2 dropped after neck fusion   COPD (chronic obstructive pulmonary disease) (HCC)    Coronary artery disease    Cough    chronic  clear phlegm   Dysrhythmia    palpitations   GERD (gastroesophageal reflux disease)    h/o reflux/ hoarsness   Hematochezia    Hemorrhoids    History of chickenpox    History of colon polyps    History of Helicobacter pylori infection    Hoarseness    Hypertension    Lung cancer ([place]) 05/2016   Chemo + rad tx's.    Migraines    OSA (obstructive sleep apnea)    has CPAP but does not use  Personal history of tobacco use, presenting hazards to health 03/05/2016   Pneumonia    5/17   Raynaud disease    Raynaud disease    Raynaud's disease    Rotator cuff tear    on right   Shortness of breath dyspnea    Sleep apnea    Ulcer (traumatic) of oral mucosa     PAST SURGICAL HISTORY: Past Surgical History:  Procedure Laterality Date   BACK SURGERY     cervical fusion x 2   CARDIAC  CATHETERIZATION     CERVICAL DISCECTOMY     x 2   COLONOSCOPY     COLONOSCOPY N/A 07/25/2015   Procedure: COLONOSCOPY;  Surgeon: Lollie Sails, MD;  Location[facility] ENDOSCOPY;  Service: Endoscopy;  Laterality: N/A;   COLONOSCOPY WITH PROPOFOL N/A 10/04/2017   Procedure: COLONOSCOPY WITH PROPOFOL;  Surgeon: Lollie Sails, MD;  Location: [facility] ENDOSCOPY;  Service: Endoscopy;  Laterality: N/A;   COLONOSCOPY WITH PROPOFOL N/A 07/10/2020   Procedure: COLONOSCOPY WITH PROPOFOL;  Surgeon: Toledo, Benay Pike, MD;  Location: [facility] ENDOSCOPY;  Service: Gastroenterology;  Laterality: N/A;   ELECTROMAGNETIC NAVIGATION BROCHOSCOPY Left 06/28/2016   Procedure: ELECTROMAGNETIC NAVIGATION BRONCHOSCOPY;  Surgeon: Vilinda Boehringer, MD;  Location: [facility] ORS;  Service: Cardiopulmonary;  Laterality: Left;   ENDOBRONCHIAL ULTRASOUND N/A 04/11/2018   Procedure: ENDOBRONCHIAL ULTRASOUND;  Surgeon: [name], MD;  Location: [facility] ORS;  Service: Cardiopulmonary;  Laterality: N/A;   ESOPHAGOGASTRODUODENOSCOPY N/A 07/25/2015   Procedure: ESOPHAGOGASTRODUODENOSCOPY (EGD);  Surgeon: Lollie Sails, MD;  Location: [facility] ENDOSCOPY;  Service: Endoscopy;  Laterality: N/A;   ESOPHAGOGASTRODUODENOSCOPY (EGD) WITH PROPOFOL N/A 07/10/2020   Procedure: ESOPHAGOGASTRODUODENOSCOPY (EGD) WITH PROPOFOL;  Surgeon: Toledo, Benay Pike, MD;  Location: [facility] ENDOSCOPY;  Service: Gastroenterology;  Laterality: N/A;   NASAL SINUS SURGERY     x 2    PORTA CATH INSERTION N/A 04/24/2018   Procedure: PORTA CATH INSERTION;  Surgeon: Algernon Huxley, MD;  Location: [place] [facility] LAB;  Service: Cardiovascular;  Laterality: N/A;   rotator cuff surgery Right    07/2016   SEPTOPLASTY     SKIN GRAFT      FAMILY HISTORY Family History  Problem Relation Age of Onset   Heart disease Father    Prostate cancer Father    Heart disease Paternal Grandmother    Heart attack Maternal Grandfather 76   Kidney cancer Neg Hx    Bladder Cancer Neg Hx    Other  Neg Hx        pituitary abnormality       ADVANCED DIRECTIVES:    HEALTH MAINTENANCE: Social History   Tobacco Use   Smoking status: Every Day    Packs/day: 3.00    Years: 45.00    Pack years: 135.00    Types: Cigarettes   Smokeless tobacco: Never   Tobacco comments:    2 PPD 04/21/2021  Vaping Use   Vaping Use: Never used  Substance Use Topics   Alcohol use: Yes    Alcohol/week: 2.0 standard drinks    Types: 2 Standard drinks or equivalent per week    Comment: moderate   Drug use: No     Allergies  Allergen Reactions   Lisinopril Rash   Varenicline Rash    Current Outpatient Medications  Medication Sig Dispense Refill   acetaminophen (TYLENOL) 500 MG tablet Take 500 mg by mouth daily as needed.     albuterol (PROAIR HFA) 108 (90 Base) MCG/ACT inhaler Inhale 2 puffs into  the lungs every 4 (four) hours as needed for wheezing or shortness of breath. 1 Inhaler 1   ALPRAZolam (XANAX) 0.25 MG tablet Take 0.25 mg by mouth 2 (two) times daily as needed.     atorvastatin (LIPITOR) 40 MG tablet Take 40 mg by mouth at bedtime.      DULoxetine (CYMBALTA) 20 MG capsule Take 20 mg by mouth daily. TAKE 2 CAPSULES BY MOUTH ONCE DAILY FOR 7 DAYS, THEN 1 CAPSULE ONCE DAILY FOR 14 DAYS.     ferrous sulfate 325 (65 FE) MG tablet Take 325 mg by mouth 2 (two) times daily with a meal.     Fluticasone-Umeclidin-Vilant (TRELEGY ELLIPTA) 200-62.5-25 MCG/INH AEPB Inhale 200 mcg into the lungs daily. 28 each 0   ipratropium-albuterol (DUONEB) 0.5-2.5 (3) MG/3ML SOLN Take 3 mLs by nebulization every 6 (six) hours as needed. 360 mL 5   levothyroxine (SYNTHROID) 150 MCG tablet Take 150 mcg by mouth daily before breakfast.     lidocaine-prilocaine (EMLA) cream Apply 1 application topically as needed. 30 g 1   magnesium oxide (MAG-OX) 400 MG tablet Take 600 mg by mouth 2 (two) times daily. Takes 1.5 tablet twice daily     metoprolol succinate (TOPROL-XL) 25 MG 24 hr tablet Take 12.5 mg by mouth  daily.     midodrine (PROAMATINE) 5 MG tablet      Multiple Vitamin (MULTIVITAMIN WITH MINERALS) TABS tablet Take 1 tablet by mouth daily.      ondansetron (ZOFRAN) 8 MG tablet Take 1 tablet (8 mg total) by mouth every 8 (eight) hours as needed for nausea or vomiting. 30 tablet 1   pantoprazole (PROTONIX) 40 MG tablet Take 40 mg by mouth 2 (two) times daily.     potassium chloride (KLOR-CON) 10 MEQ tablet Take 1 tablet by mouth daily.     sodium chloride 1 g tablet Take 1 tablet (1 g total) by mouth 3 (three) times daily with meals. 90 tablet 1   torsemide (DEMADEX) 10 MG tablet Take 1 tablet by mouth daily.     nitroGLYCERIN (NITROSTAT) 0.4 MG SL tablet Place under the tongue. (Patient not taking: Reported on 11/18/2021)     OXYGEN Inhale 2 L into the lungs at bedtime. (Patient not taking: Reported on 10/15/2021)     tadalafil (CIALIS) 5 MG tablet Take 1 tablet (5 mg total) by mouth daily as needed for erectile dysfunction. (Patient not taking: Reported on 10/15/2021) 90 tablet 3   No current facility-administered medications for this visit.    OBJECTIVE: There were no vitals filed for this visit.     There is no height or weight on file to calculate BMI.    ECOG FS:1 - Symptomatic but completely ambulatory  General: Well-developed, well-nourished, no acute distress. HEENT: Normocephalic. Neuro: Alert, answering all questions appropriately. Cranial nerves grossly intact. Psych: Normal affect.  LAB RESULTS:  Lab Results  Component Value Date   NA 126 (L) 11/09/2021   K 4.6 11/09/2021   CL 90 (L) 11/09/2021   CO2 25 11/09/2021   GLUCOSE 113 (H) 11/09/2021   BUN 11 11/09/2021   CREATININE 0.62 11/09/2021   CALCIUM 8.8 (L) 11/09/2021   PROT 6.8 11/03/2021   ALBUMIN 2.9 (L) 11/03/2021   AST 23 11/03/2021   ALT 29 11/03/2021   ALKPHOS 105 11/03/2021   BILITOT 0.2 (L) 11/03/2021   GFRNONAA >60 11/09/2021   GFRAA >60 04/24/2020    Lab Results  Component Value Date   WBC 7.7  11/05/2021   NEUTROABS 9.2 (H) 11/03/2021   HGB 9.5 (L) 11/05/2021   HCT 28.5 (L) 11/05/2021   MCV 90.5 11/05/2021   PLT 324 11/05/2021   Lab Results  Component Value Date   IRON 20 (L) 11/03/2021   TIBC 224 (L) 11/03/2021   IRONPCTSAT 9 (L) 11/03/2021   Lab Results  Component Value Date   FERRITIN 662 (H) 11/03/2021     STUDIES:  ONCOLOGY HISTORY: Case discussed with pathologist and unable to determine whether this is adenocarcinoma or squamous cell carcinoma.  There is also insufficient tissue to do further testing.  Liquid biopsy did not reveal any actionable mutations.  MRI of the brain completed on Mar 28, 2018 reviewed independently did not reveal metastatic disease.  Patient completed XRT June 26, 2018.  He completed his concurrent single agent carboplatinum on June 21, 2018.  Patient had a reaction to Taxol during cycle 1 and this was discontinued.  He completed a year-long of maintenance durvalumab on June 27, 2019.   ASSESSMENT: Recurrent stage IIIa non-small cell lung cancer, right middle lobe.  PLAN:    1.  Recurrent stage IIIa non-small cell lung cancer, right middle lobe: See oncology history above.  PET scan results from November 16, 2021 reviewed independently with once again suspicion of progression of disease.  After lengthy discussion with the patient, it was agreed that no biopsy is necessary and may be too difficult given his recent C2 cervical fracture.  Patient and family have expressed interest in pursuing systemic chemotherapy and will proceed with single agent Taxotere every 3 weeks for 4 cycles with Udenyca support.   Return to clinic Friday for laboratory work and in 1 week for further evaluation and consideration of cycle 1.   2.  Iron deficiency anemia: Patient received 1 infusion of Venofer while in the hospital.  Repeat laboratory on Friday and consider additional Venofer along with Taxotere.  Continue oral iron supplementation. 3.  Colon polyps:   Patient has a personal history of greater then 10 adenomatous polyps on his most recent conoloscopy. He does not know of any family history of increased polyps or colon cancer.  Genetic testing to assess for the APC mutation for FAP or AFAP was negative. Continue colonoscopies as per GI. 5. Tobacco Use: Chronic and unchanged.  Patient continues to heavily smoke.  He previously expressed understanding by continuing tobacco use increases his chance of recurrence. 6. Anxiety: Chronic and unchanged.  Continue Valium 10 mg every 12 hours as needed.  Continue treatment and evaluation per primary care. 7.  Hyponatremia: Sodium level improved to 126.  Continue salt tablets as per nephrology.  8.  Hypothyroidism: Appreciate endocrinology input.  Continue Synthroid as prescribed.  Follow-up with endocrinology as scheduled. 9.  CHF: Patient has an EF of 40%.  Continue evaluation and treatment per cardiology. 10.  Leukocytosis: Resolved. 11.  Nausea: Patient does not complain of this today.  Continue Zofran as needed.  I provided 30 minutes of face-to-face video visit time during this encounter which included chart review, counseling, and coordination of care as documented above.     Patient expressed understanding and was in agreement with this plan. He also understands that He can call clinic at any time with any questions, concerns, or complaints.    [name], MD   11/19/2021 6:51 AM

## 2021-11-19 NOTE — Patient Instructions (Addendum)
Continue inhalers as prescribed Continue oxygen at nighttime Use albuterol as needed Please stop smoking Follow-up with oncology and chemotherapy

## 2021-11-20 ENCOUNTER — Inpatient Hospital Stay: Payer: BC Managed Care – PPO

## 2021-11-20 DIAGNOSIS — C3491 Malignant neoplasm of unspecified part of right bronchus or lung: Secondary | ICD-10-CM

## 2021-11-20 DIAGNOSIS — Z5111 Encounter for antineoplastic chemotherapy: Secondary | ICD-10-CM | POA: Diagnosis not present

## 2021-11-20 LAB — COMPREHENSIVE METABOLIC PANEL
ALT: 15 U/L (ref 0–44)
AST: 14 U/L — ABNORMAL LOW (ref 15–41)
Albumin: 3.3 g/dL — ABNORMAL LOW (ref 3.5–5.0)
Alkaline Phosphatase: 86 U/L (ref 38–126)
Anion gap: 7 (ref 5–15)
BUN: 12 mg/dL (ref 8–23)
CO2: 29 mmol/L (ref 22–32)
Calcium: 8.9 mg/dL (ref 8.9–10.3)
Chloride: 91 mmol/L — ABNORMAL LOW (ref 98–111)
Creatinine, Ser: 0.68 mg/dL (ref 0.61–1.24)
GFR, Estimated: >60 mL/min (ref >60)
Glucose, Bld: 100 mg/dL — ABNORMAL HIGH (ref 70–99)
Potassium: 4.7 mmol/L (ref 3.5–5.1)
Sodium: 127 mmol/L — ABNORMAL LOW (ref 135–145)
Total Bilirubin: 0.5 mg/dL (ref 0.3–1.2)
Total Protein: 6.9 g/dL (ref 6.5–8.1)

## 2021-11-20 LAB — CBC WITH DIFFERENTIAL/PLATELET
Abs Immature Granulocytes: 0.13 10*3/uL — ABNORMAL HIGH (ref 0.00–0.07)
Basophils Absolute: 0 10*3/uL (ref 0.0–0.1)
Basophils Relative: 0 %
Eosinophils Absolute: 0.2 10*3/uL (ref 0.0–0.5)
Eosinophils Relative: 1 %
HCT: 34.1 % — ABNORMAL LOW (ref 39.0–52.0)
Hemoglobin: 11.1 g/dL — ABNORMAL LOW (ref 13.0–17.0)
Immature Granulocytes: 1 %
Lymphocytes Relative: 3 %
Lymphs Abs: 0.4 10*3/uL — ABNORMAL LOW (ref 0.7–4.0)
MCH: 30.7 pg (ref 26.0–34.0)
MCHC: 32.6 g/dL (ref 30.0–36.0)
MCV: 94.2 fL (ref 80.0–100.0)
Monocytes Absolute: 0.8 10*3/uL (ref 0.1–1.0)
Monocytes Relative: 6 %
Neutro Abs: 11.1 10*3/uL — ABNORMAL HIGH (ref 1.7–7.7)
Neutrophils Relative %: 89 %
Platelets: 219 10*3/uL (ref 150–400)
RBC: 3.62 MIL/uL — ABNORMAL LOW (ref 4.22–5.81)
RDW: 16 % — ABNORMAL HIGH (ref 11.5–15.5)
WBC: 12.6 10*3/uL — ABNORMAL HIGH (ref 4.0–10.5)
nRBC: 0 % (ref 0.0–0.2)

## 2021-11-20 LAB — IRON AND TIBC
Iron: 32 ug/dL — ABNORMAL LOW (ref 45–182)
Saturation Ratios: 12 % — ABNORMAL LOW (ref 17.9–39.5)
TIBC: 277 ug/dL (ref 250–450)
UIBC: 245 ug/dL

## 2021-11-20 LAB — FERRITIN: Ferritin: 318 ng/mL (ref 24–336)

## 2021-11-23 ENCOUNTER — Other Ambulatory Visit (HOSPITAL_COMMUNITY): Payer: Self-pay

## 2021-11-23 ENCOUNTER — Encounter: Payer: Self-pay | Admitting: Oncology

## 2021-11-23 ENCOUNTER — Telehealth: Payer: Self-pay | Admitting: Pharmacy Technician

## 2021-11-23 NOTE — Telephone Encounter (Signed)
Patient Advocate Encounter   Received notification from CoverMyMeds that prior authorization for Levalbuterol HFA (generic Xopenex)  is required by his/her Secretary/administrator.  Per Test Claim: brand Ventolin preferred.   PA submitted on 11/23/21 [place] Status is pending    [facility] will continue to follow:  Patient Advocate Fax:  [phone]

## 2021-11-26 ENCOUNTER — Other Ambulatory Visit (HOSPITAL_COMMUNITY): Payer: Self-pay

## 2021-11-26 ENCOUNTER — Inpatient Hospital Stay: Payer: BC Managed Care – PPO | Admitting: Oncology

## 2021-11-26 ENCOUNTER — Other Ambulatory Visit: Payer: Self-pay

## 2021-11-26 ENCOUNTER — Inpatient Hospital Stay: Payer: BC Managed Care – PPO

## 2021-11-26 ENCOUNTER — Inpatient Hospital Stay (HOSPITAL_BASED_OUTPATIENT_CLINIC_OR_DEPARTMENT_OTHER): Payer: BC Managed Care – PPO

## 2021-11-26 ENCOUNTER — Encounter: Payer: Self-pay | Admitting: Oncology

## 2021-11-26 VITALS — BP 141/72 | HR 95 | Temp 98.6°F | Resp 18

## 2021-11-26 VITALS — BP 129/80 | HR 100 | Temp 98.7°F | Resp 20 | Wt 192.7 lb

## 2021-11-26 DIAGNOSIS — C3491 Malignant neoplasm of unspecified part of right bronchus or lung: Secondary | ICD-10-CM

## 2021-11-26 DIAGNOSIS — Z5111 Encounter for antineoplastic chemotherapy: Secondary | ICD-10-CM | POA: Diagnosis not present

## 2021-11-26 DIAGNOSIS — D751 Secondary polycythemia: Secondary | ICD-10-CM

## 2021-11-26 LAB — COMPREHENSIVE METABOLIC PANEL
ALT: 20 U/L (ref 0–44)
AST: 16 U/L (ref 15–41)
Albumin: 3.2 g/dL — ABNORMAL LOW (ref 3.5–5.0)
Alkaline Phosphatase: 86 U/L (ref 38–126)
Anion gap: 12 (ref 5–15)
BUN: 9 mg/dL (ref 8–23)
CO2: 26 mmol/L (ref 22–32)
Calcium: 9 mg/dL (ref 8.9–10.3)
Chloride: 92 mmol/L — ABNORMAL LOW (ref 98–111)
Creatinine, Ser: 0.57 mg/dL — ABNORMAL LOW (ref 0.61–1.24)
GFR, Estimated: >60 mL/min (ref >60)
Glucose, Bld: 104 mg/dL — ABNORMAL HIGH (ref 70–99)
Potassium: 3.9 mmol/L (ref 3.5–5.1)
Sodium: 130 mmol/L — ABNORMAL LOW (ref 135–145)
Total Bilirubin: 0.3 mg/dL (ref 0.3–1.2)
Total Protein: 7.1 g/dL (ref 6.5–8.1)

## 2021-11-26 LAB — CBC WITH DIFFERENTIAL/PLATELET
Abs Immature Granulocytes: 0.04 10*3/uL (ref 0.00–0.07)
Basophils Absolute: 0.1 10*3/uL (ref 0.0–0.1)
Basophils Relative: 1 %
Eosinophils Absolute: 0.2 10*3/uL (ref 0.0–0.5)
Eosinophils Relative: 2 %
HCT: 31.2 % — ABNORMAL LOW (ref 39.0–52.0)
Hemoglobin: 10.4 g/dL — ABNORMAL LOW (ref 13.0–17.0)
Immature Granulocytes: 1 %
Lymphocytes Relative: 5 %
Lymphs Abs: 0.4 10*3/uL — ABNORMAL LOW (ref 0.7–4.0)
MCH: 30.9 pg (ref 26.0–34.0)
MCHC: 33.3 g/dL (ref 30.0–36.0)
MCV: 92.6 fL (ref 80.0–100.0)
Monocytes Absolute: 0.6 10*3/uL (ref 0.1–1.0)
Monocytes Relative: 7 %
Neutro Abs: 7.1 10*3/uL (ref 1.7–7.7)
Neutrophils Relative %: 84 %
Platelets: 212 10*3/uL (ref 150–400)
RBC: 3.37 MIL/uL — ABNORMAL LOW (ref 4.22–5.81)
RDW: 15.5 % (ref 11.5–15.5)
WBC: 8.3 10*3/uL (ref 4.0–10.5)
nRBC: 0 % (ref 0.0–0.2)

## 2021-11-26 MED ORDER — LORAZEPAM 2 MG/ML IJ SOLN
0.5000 mg | Freq: Once | INTRAMUSCULAR | Status: AC
  Administered 2021-11-26: 0.5 mg via INTRAVENOUS
  Filled 2021-11-26: qty 1

## 2021-11-26 MED ORDER — SODIUM CHLORIDE 0.9 % IV SOLN
Freq: Once | INTRAVENOUS | Status: AC
  Filled 2021-11-26: qty 250

## 2021-11-26 MED ORDER — ONDANSETRON HCL 8 MG PO TABS: 8.0000 mg | ORAL_TABLET | Freq: Three times a day (TID) | ORAL | 1 refills | Status: DC | PRN

## 2021-11-26 MED ORDER — SODIUM CHLORIDE 0.9 % IV SOLN
200.0000 mg | Freq: Once | INTRAVENOUS | Status: DC
  Filled 2021-11-26: qty 10

## 2021-11-26 MED ORDER — SODIUM CHLORIDE 0.9 % IV SOLN
75.0000 mg/m2 | Freq: Once | INTRAVENOUS | Status: AC
  Administered 2021-11-26: 150 mg via INTRAVENOUS
  Filled 2021-11-26: qty 15

## 2021-11-26 MED ORDER — SODIUM CHLORIDE 0.9 % IV SOLN
10.0000 mg | Freq: Once | INTRAVENOUS | Status: AC
  Administered 2021-11-26: 10 mg via INTRAVENOUS
  Filled 2021-11-26: qty 10

## 2021-11-26 MED ORDER — HEPARIN SOD (PORK) LOCK FLUSH 100 UNIT/ML IV SOLN
INTRAVENOUS | Status: AC
  Filled 2021-11-26: qty 5

## 2021-11-26 MED ORDER — HEPARIN SOD (PORK) LOCK FLUSH 100 UNIT/ML IV SOLN
500.0000 [IU] | Freq: Once | INTRAVENOUS | Status: AC | PRN
  Administered 2021-11-26: 500 [IU]
  Filled 2021-11-26: qty 5

## 2021-11-26 MED ORDER — IRON SUCROSE 20 MG/ML IV SOLN
200.0000 mg | Freq: Once | INTRAVENOUS | Status: AC
  Administered 2021-11-26: 200 mg via INTRAVENOUS
  Filled 2021-11-26: qty 10

## 2021-11-26 MED ORDER — PROCHLORPERAZINE MALEATE 10 MG PO TABS: 10.0000 mg | ORAL_TABLET | Freq: Four times a day (QID) | ORAL | 3 refills | Status: DC | PRN

## 2021-11-26 NOTE — Telephone Encounter (Signed)
Received notification from [facility] [place]) regarding a prior authorization for LEVALBUTEROL . Authorization has been APPROVED from 0.23.23 to 1.22.24.   Per [facility] test claim, copay for 30 days supply is $30.57

## 2021-11-26 NOTE — Progress Notes (Signed)
After stopping the Taxotere at 1340, patient started complaining of an increase in cough. No signs of shortness of breath, chest pain or hives.  Normal saline bolus started and Lauren, NP notified. Patient stated, " I have a cough like this at home all of the time. It seem to get better after I take my cough medicine." Per Ander Purpura, NP she does not feel like the cough is related to an allergic reaction of the Taxotere. Ordered to observe for 15 minutes and if cough worsens Benadryl can be given, if symptoms improve patient is good to go home. Will continue to monitor patient closely.   At 1425, patient coughing is still present but is improved. No new symptoms reported. Informed patient to call the [place] if he has any concerns when he get home. Vitals stable at discharge and patient was sent home.

## 2021-11-26 NOTE — Progress Notes (Signed)
Refill on zofran and compazine.

## 2021-11-26 NOTE — Patient Instructions (Addendum)
[facility] AT [place]  Discharge Instructions: Thank you for choosing [facility] to provide your oncology and hematology care.  If you have a lab appointment with the [facility], please go directly to the [facility] and check in at the registration area.  Wear comfortable clothing and clothing appropriate for easy access to any Portacath or PICC line.   We strive to give you quality time with your provider. You may need to reschedule your appointment if you arrive late (15 or more minutes).  Arriving late affects you and other patients whose appointments are after yours.  Also, if you miss three or more appointments without notifying the office, you may be dismissed from the clinic at the providers discretion.      For prescription refill requests, have your pharmacy contact our office and allow 72 hours for refills to be completed.    Today you received the following chemotherapy and/or immunotherapy agents Taxotere      To help prevent nausea and vomiting after your treatment, we encourage you to take your nausea medication as directed.  BELOW ARE SYMPTOMS THAT SHOULD BE REPORTED IMMEDIATELY: *FEVER GREATER THAN 100.4 F (38 C) OR HIGHER *CHILLS OR SWEATING *NAUSEA AND VOMITING THAT IS NOT CONTROLLED WITH YOUR NAUSEA MEDICATION *UNUSUAL SHORTNESS OF BREATH *UNUSUAL BRUISING OR BLEEDING *URINARY PROBLEMS (pain or burning when urinating, or frequent urination) *BOWEL PROBLEMS (unusual diarrhea, constipation, pain near the anus) TENDERNESS IN MOUTH AND THROAT WITH OR WITHOUT PRESENCE OF ULCERS (sore throat, sores in mouth, or a toothache) UNUSUAL RASH, SWELLING OR PAIN  UNUSUAL VAGINAL DISCHARGE OR ITCHING   Items with * indicate a potential emergency and should be followed up as soon as possible or go to the Emergency Department if any problems should occur.  Please show the CHEMOTHERAPY ALERT CARD or IMMUNOTHERAPY ALERT CARD at check-in to  the Emergency Department and triage nurse.  Should you have questions after your visit or need to cancel or reschedule your appointment, please contact [facility] [place] [facility] [place]  [phone] and follow the prompts.  Office hours are 8:00 a.m. to 4:30 p.m. Monday - Friday. Please note that voicemails left after 4:00 p.m. may not be returned until the following business day.  We are closed weekends and major holidays. You have access to a nurse at all times for urgent questions. Please call the main number to the clinic [phone] and follow the prompts.  For any non-urgent questions, you may also contact your provider using MyChart. We now offer e-Visits for anyone 69 and older to request care online for non-urgent symptoms. For details visit [email or web address].   Also download the MyChart app! Go to the app store, search "MyChart", open the app, select [facility], and log in with your MyChart username and password.  Due to Covid, a mask is required upon entering the hospital/clinic. If you do not have a mask, one will be given to you upon arrival. For doctor visits, patients may have 1 support person aged 88 or older with them. For treatment visits, patients cannot have anyone with them due to current Covid guidelines and our immunocompromised population.  Docetaxel injection What is this medication? DOCETAXEL (doe se TAX el) is a chemotherapy drug. It targets fast dividing cells, like cancer cells, and causes these cells to die. This medicine is used to treat many types of cancers like breast cancer, certain stomach cancers, head and neck cancer, lung cancer, and prostate cancer. This medicine  may be used for other purposes; ask your health care provider or pharmacist if you have questions. COMMON BRAND NAME(S): Docefrez, Taxotere What should I tell my care team before I take this medication? They need to know if you have any of these conditions: infection (especially a  virus infection such as chickenpox, cold sores, or herpes) liver disease low blood counts, like low white cell, platelet, or red cell counts an unusual or allergic reaction to docetaxel, polysorbate 80, other chemotherapy agents, other medicines, foods, dyes, or preservatives pregnant or trying to get pregnant breast-feeding How should I use this medication? This drug is given as an infusion into a vein. It is administered in a hospital or clinic by a specially trained health care professional. Talk to your pediatrician regarding the use of this medicine in children. Special care may be needed. Overdosage: If you think you have taken too much of this medicine contact a poison control center or emergency room at once. NOTE: This medicine is only for you. Do not share this medicine with others. What if I miss a dose? It is important not to miss your dose. Call your doctor or health care professional if you are unable to keep an appointment. What may interact with this medication? Do not take this medicine with any of the following medications: live virus vaccines This medicine may also interact with the following medications: aprepitant certain antibiotics like erythromycin or clarithromycin certain antivirals for HIV or hepatitis certain medicines for fungal infections like fluconazole, itraconazole, ketoconazole, posaconazole, or voriconazole cimetidine ciprofloxacin conivaptan cyclosporine dronedarone fluvoxamine grapefruit juice imatinib verapamil This list may not describe all possible interactions. Give your health care provider a list of all the medicines, herbs, non-prescription drugs, or dietary supplements you use. Also tell them if you smoke, drink alcohol, or use illegal drugs. Some items may interact with your medicine. What should I watch for while using this medication? Your condition will be monitored carefully while you are receiving this medicine. You will need  important blood work done while you are taking this medicine. Call your doctor or health care professional for advice if you get a fever, chills or sore throat, or other symptoms of a cold or flu. Do not treat yourself. This drug decreases your body's ability to fight infections. Try to avoid being around people who are sick. Some products may contain alcohol. Ask your health care professional if this medicine contains alcohol. Be sure to tell all health care professionals you are taking this medicine. Certain medicines, like metronidazole and disulfiram, can cause an unpleasant reaction when taken with alcohol. The reaction includes flushing, headache, nausea, vomiting, sweating, and increased thirst. The reaction can last from 30 minutes to several hours. You may get drowsy or dizzy. Do not drive, use machinery, or do anything that needs mental alertness until you know how this medicine affects you. Do not stand or sit up quickly, especially if you are an older patient. This reduces the risk of dizzy or fainting spells. Alcohol may interfere with the effect of this medicine. Talk to your health care professional about your risk of cancer. You may be more at risk for certain types of cancer if you take this medicine. Do not become pregnant while taking this medicine or for 6 months after stopping it. Women should inform their doctor if they wish to become pregnant or think they might be pregnant. There is a potential for serious side effects to an unborn child. Talk to your health  care professional or pharmacist for more information. Do not breast-feed an infant while taking this medicine or for 1 week after stopping it. Males who get this medicine must use a condom during sex with females who can get pregnant. If you get a woman pregnant, the baby could have birth defects. The baby could die before they are born. You will need to continue wearing a condom for 3 months after stopping the medicine. Tell your  health care provider right away if your partner becomes pregnant while you are taking this medicine. This may interfere with the ability to father a child. You should talk to your doctor or health care professional if you are concerned about your fertility. What side effects may I notice from receiving this medication? Side effects that you should report to your doctor or health care professional as soon as possible: allergic reactions like skin rash, itching or hives, swelling of the face, lips, or tongue blurred vision breathing problems changes in vision low blood counts - This drug may decrease the number of white blood cells, red blood cells and platelets. You may be at increased risk for infections and bleeding. nausea and vomiting pain, redness or irritation at site where injected pain, tingling, numbness in the hands or feet redness, blistering, peeling, or loosening of the skin, including inside the mouth signs of decreased platelets or bleeding - bruising, pinpoint red spots on the skin, black, tarry stools, nosebleeds signs of decreased red blood cells - unusually weak or tired, fainting spells, lightheadedness signs of infection - fever or chills, cough, sore throat, pain or difficulty passing urine swelling of the ankle, feet, hands Side effects that usually do not require medical attention (report to your doctor or health care professional if they continue or are bothersome): constipation diarrhea fingernail or toenail changes hair loss loss of appetite mouth sores muscle pain This list may not describe all possible side effects. Call your doctor for medical advice about side effects. You may report side effects to FDA at 1-800-FDA-1088. Where should I keep my medication? This drug is given in a hospital or clinic and will not be stored at home. NOTE: This sheet is a summary. It may not cover all possible information. If you have questions about this medicine, talk to your  doctor, pharmacist, or health care provider.  2022 Elsevier/Gold Standard (2021-07-07 00:00:00)

## 2021-11-27 ENCOUNTER — Encounter: Payer: Self-pay | Admitting: Oncology

## 2021-11-27 ENCOUNTER — Inpatient Hospital Stay: Payer: BC Managed Care – PPO

## 2021-11-27 DIAGNOSIS — Z5111 Encounter for antineoplastic chemotherapy: Secondary | ICD-10-CM | POA: Diagnosis not present

## 2021-11-27 DIAGNOSIS — C3491 Malignant neoplasm of unspecified part of right bronchus or lung: Secondary | ICD-10-CM

## 2021-11-27 MED ORDER — PEGFILGRASTIM-CBQV 6 MG/0.6ML ~~LOC~~ SOSY
6.0000 mg | PREFILLED_SYRINGE | Freq: Once | SUBCUTANEOUS | Status: AC
  Administered 2021-11-27: 6 mg via SUBCUTANEOUS
  Filled 2021-11-27: qty 0.6

## 2021-11-30 ENCOUNTER — Telehealth: Payer: Self-pay | Admitting: *Deleted

## 2021-11-30 ENCOUNTER — Encounter: Payer: Self-pay | Admitting: Emergency Medicine

## 2021-11-30 NOTE — Telephone Encounter (Signed)
Patient called asking for prescription for a mouth rinse or mouthwash stating that he has ulcers and thrush, white patches in his mouth form his medications. Please advise

## 2021-11-30 NOTE — Telephone Encounter (Signed)
CAll returned to patient to inform of prescription being sent. He thanked me for letting him know

## 2021-11-30 NOTE — Telephone Encounter (Signed)
Rx faxed to [facility] 

## 2021-12-01 NOTE — Progress Notes (Signed)
[facility]  [phone] [phone]) [phone]  ID: [name] OB: 04/19/1956  MR#: [id number]  RDE#:[id number]  Patient Care Team: Peggye Form, NP as PCP - General (Family Medicine) Lucky Cowboy, Erskine Squibb, MD as Referring Physician (Vascular Surgery) [name], MD as Consulting Physician (Oncology) [name], MD as Referring Physician (Radiation Oncology)  CHIEF COMPLAINT: Recurrent stage IIIa non-small cell lung cancer, right middle lobe.  INTERVAL HISTORY: Patient returns to clinic today for further evaluation, additional IV Venofer, and to assess his toleration of cycle 1 of Taxotere.  He had increased weakness and fatigue and a poor appetite, but otherwise appears to have tolerated his treatment well.  The Udenyca shot gave him bony pain and muscle aches.  He continues to have chronic cough.  He continues to smoke.  He has no neurologic complaints.  He denies any fevers.  He denies any chest pain, shortness of breath, or hemoptysis. He denies any vomiting, constipation, or diarrhea. He has no urinary complaints.  Patient offers no further specific complaints today.  REVIEW OF SYSTEMS:   Review of Systems  Constitutional:  Positive for malaise/fatigue. Negative for fever and weight loss.  HENT: Negative.  Negative for congestion and sore throat.   Respiratory:  Positive for cough. Negative for hemoptysis and shortness of breath.   Cardiovascular: Negative.  Negative for chest pain and leg swelling.  Gastrointestinal: Negative.  Negative for abdominal pain, blood in stool, melena and nausea.  Genitourinary: Negative.  Negative for dysuria.  Musculoskeletal:  Positive for neck pain. Negative for joint pain.  Skin: Negative.  Negative for rash.  Neurological:  Positive for weakness. Negative for dizziness, sensory change, focal weakness and headaches.  Psychiatric/Behavioral:  The patient is nervous/anxious.    As per HPI. Otherwise, a  complete review of systems is negative.  PAST MEDICAL HISTORY: Past Medical History:  Diagnosis Date   Anginal pain ([place])    Anxiety    Asthma    Chest pain    CHF (congestive heart failure) (HCC)    Chicken pox    Complication of anesthesia    o2 dropped after neck fusion   COPD (chronic obstructive pulmonary disease) (HCC)    Coronary artery disease    Cough    chronic  clear phlegm   Dysrhythmia    palpitations   GERD (gastroesophageal reflux disease)    h/o reflux/ hoarsness   Hematochezia    Hemorrhoids    History of chickenpox    History of colon polyps    History of Helicobacter pylori infection    Hoarseness    Hypertension    Lung cancer ([place]) 05/2016   Chemo + rad tx's.    Migraines    OSA (obstructive sleep apnea)    has CPAP but does not use   Personal history of tobacco use, presenting hazards to health 03/05/2016   Pneumonia    5/17   Raynaud disease    Raynaud disease    Raynaud's disease    Rotator cuff tear    on right   Shortness of breath dyspnea    Sleep apnea    Ulcer (traumatic) of oral mucosa     PAST SURGICAL HISTORY: Past Surgical History:  Procedure Laterality Date   BACK SURGERY     cervical fusion x 2   CARDIAC CATHETERIZATION     CERVICAL DISCECTOMY     x 2   COLONOSCOPY     COLONOSCOPY N/A 07/25/2015  Procedure: COLONOSCOPY;  Surgeon: Lollie Sails, MD;  Location[facility] ENDOSCOPY;  Service: Endoscopy;  Laterality: N/A;   COLONOSCOPY WITH PROPOFOL N/A 10/04/2017   Procedure: COLONOSCOPY WITH PROPOFOL;  Surgeon: Lollie Sails, MD;  Location[facility] ENDOSCOPY;  Service: Endoscopy;  Laterality: N/A;   COLONOSCOPY WITH PROPOFOL N/A 07/10/2020   Procedure: COLONOSCOPY WITH PROPOFOL;  Surgeon: Toledo, Benay Pike, MD;  Location: [facility] ENDOSCOPY;  Service: Gastroenterology;  Laterality: N/A;   ELECTROMAGNETIC NAVIGATION BROCHOSCOPY Left 06/28/2016   Procedure: ELECTROMAGNETIC NAVIGATION BRONCHOSCOPY;  Surgeon: Vilinda Boehringer, MD;   Location: [facility] ORS;  Service: Cardiopulmonary;  Laterality: Left;   ENDOBRONCHIAL ULTRASOUND N/A 04/11/2018   Procedure: ENDOBRONCHIAL ULTRASOUND;  Surgeon: [name], MD;  Location: [facility] ORS;  Service: Cardiopulmonary;  Laterality: N/A;   ESOPHAGOGASTRODUODENOSCOPY N/A 07/25/2015   Procedure: ESOPHAGOGASTRODUODENOSCOPY (EGD);  Surgeon: Lollie Sails, MD;  Location: [facility] ENDOSCOPY;  Service: Endoscopy;  Laterality: N/A;   ESOPHAGOGASTRODUODENOSCOPY (EGD) WITH PROPOFOL N/A 07/10/2020   Procedure: ESOPHAGOGASTRODUODENOSCOPY (EGD) WITH PROPOFOL;  Surgeon: Toledo, Benay Pike, MD;  Location: [facility] ENDOSCOPY;  Service: Gastroenterology;  Laterality: N/A;   NASAL SINUS SURGERY     x 2    PORTA CATH INSERTION N/A 04/24/2018   Procedure: PORTA CATH INSERTION;  Surgeon: Algernon Huxley, MD;  Location: [facility] CV LAB;  Service: Cardiovascular;  Laterality: N/A;   rotator cuff surgery Right    07/2016   SEPTOPLASTY     SKIN GRAFT      FAMILY HISTORY Family History  Problem Relation Age of Onset   Heart disease Father    Prostate cancer Father    Heart disease Paternal Grandmother    Heart attack Maternal Grandfather 31   Kidney cancer Neg Hx    Bladder Cancer Neg Hx    Other Neg Hx        pituitary abnormality       ADVANCED DIRECTIVES:    HEALTH MAINTENANCE: Social History   Tobacco Use   Smoking status: Every Day    Packs/day: 3.00    Years: 50.00    Pack years: 150.00    Types: Cigarettes   Smokeless tobacco: Never   Tobacco comments:    1.5-2 PPD 11/19/2021  Vaping Use   Vaping Use: Never used  Substance Use Topics   Alcohol use: Yes    Alcohol/week: 2.0 standard drinks    Types: 2 Standard drinks or equivalent per week    Comment: moderate   Drug use: No     Allergies  Allergen Reactions   Lisinopril Rash   Varenicline Rash    Current Outpatient Medications  Medication Sig Dispense Refill   acetaminophen (TYLENOL) 500 MG tablet Take 500 mg by mouth daily as  needed.     albuterol (PROAIR HFA) 108 (90 Base) MCG/ACT inhaler Inhale 2 puffs into the lungs every 4 (four) hours as needed for wheezing or shortness of breath. 1 Inhaler 1   atorvastatin (LIPITOR) 40 MG tablet Take 40 mg by mouth at bedtime.      ferrous sulfate 325 (65 FE) MG tablet Take 325 mg by mouth at bedtime.     Fluticasone-Umeclidin-Vilant (TRELEGY ELLIPTA) 200-62.5-25 MCG/INH AEPB Inhale 200 mcg into the lungs daily. 28 each 0   ipratropium-albuterol (DUONEB) 0.5-2.5 (3) MG/3ML SOLN Take 3 mLs by nebulization every 6 (six) hours as needed. 360 mL 5   levalbuterol (XOPENEX HFA) 45 MCG/ACT inhaler Inhale 1 puff into the lungs every 6 (six) hours as needed for wheezing. 1  each 5   levothyroxine (SYNTHROID) 150 MCG tablet Take 150 mcg by mouth daily before breakfast.     lidocaine-prilocaine (EMLA) cream Apply 1 application topically as needed. 30 g 1   magnesium oxide (MAG-OX) 400 MG tablet Take 600 mg by mouth 2 (two) times daily. Takes 1.5 tablet twice daily     metoprolol succinate (TOPROL-XL) 25 MG 24 hr tablet Take 12.5 mg by mouth daily.     midodrine (PROAMATINE) 5 MG tablet as needed.     nitroGLYCERIN (NITROSTAT) 0.4 MG SL tablet Place under the tongue.     ondansetron (ZOFRAN) 8 MG tablet Take 1 tablet (8 mg total) by mouth every 8 (eight) hours as needed for nausea or vomiting. 30 tablet 1   OXYGEN Inhale 2 L into the lungs at bedtime.     pantoprazole (PROTONIX) 40 MG tablet Take 40 mg by mouth 2 (two) times daily.     potassium chloride (KLOR-CON) 10 MEQ tablet Take 1 tablet by mouth daily.     prochlorperazine (COMPAZINE) 10 MG tablet Take 1 tablet (10 mg total) by mouth every 6 (six) hours as needed for nausea or vomiting. 60 tablet 3   sodium chloride 1 g tablet Take 1 tablet (1 g total) by mouth 3 (three) times daily with meals. 90 tablet 1   tadalafil (CIALIS) 5 MG tablet Take 1 tablet (5 mg total) by mouth daily as needed for erectile dysfunction. 90 tablet 3    diazepam (VALIUM) 5 MG tablet Take 5 mg by mouth at bedtime as needed.     No current facility-administered medications for this visit.    OBJECTIVE: Vitals:   12/03/21 0924  BP: 125/68  Pulse: (!) 106  Temp: 97.6 F (36.4 C)       Body mass index is 26.64 kg/m.    ECOG FS:1 - Symptomatic but completely ambulatory  General: Well-developed, well-nourished, no acute distress. Eyes: Pink conjunctiva, anicteric sclera. HEENT: Normocephalic, moist mucous membranes. Lungs: No audible wheezing or coughing. Heart: Regular rate and rhythm. Abdomen: Soft, nontender, no obvious distention. Musculoskeletal: No edema, cyanosis, or clubbing. Neuro: Alert, answering all questions appropriately. Cranial nerves grossly intact. Skin: No rashes or petechiae noted. Psych: Normal affect.   LAB RESULTS:  Lab Results  Component Value Date   NA 122 (L) 12/03/2021   K 3.8 12/03/2021   CL 91 (L) 12/03/2021   CO2 25 12/03/2021   GLUCOSE 108 (H) 12/03/2021   BUN 6 (L) 12/03/2021   CREATININE 0.59 (L) 12/03/2021   CALCIUM 8.2 (L) 12/03/2021   PROT 6.4 (L) 12/03/2021   ALBUMIN 3.3 (L) 12/03/2021   AST 17 12/03/2021   ALT 17 12/03/2021   ALKPHOS 113 12/03/2021   BILITOT 0.2 (L) 12/03/2021   GFRNONAA >60 12/03/2021   GFRAA >60 04/24/2020    Lab Results  Component Value Date   WBC 17.2 (H) 12/03/2021   NEUTROABS 13.1 (H) 12/03/2021   HGB 10.4 (L) 12/03/2021   HCT 32.1 (L) 12/03/2021   MCV 92.0 12/03/2021   PLT 293 12/03/2021   Lab Results  Component Value Date   IRON 32 (L) 11/20/2021   TIBC 277 11/20/2021   IRONPCTSAT 12 (L) 11/20/2021   Lab Results  Component Value Date   FERRITIN 318 11/20/2021     STUDIES:  ONCOLOGY HISTORY: Case discussed with pathologist and unable to determine whether this is adenocarcinoma or squamous cell carcinoma.  There is also insufficient tissue to do further testing.  Liquid biopsy  did not reveal any actionable mutations.  MRI of the brain  completed on Mar 28, 2018 reviewed independently did not reveal metastatic disease.  Patient completed XRT June 26, 2018.  He completed his concurrent single agent carboplatinum on June 21, 2018.  Patient had a reaction to Taxol during cycle 1 and this was discontinued.  He completed a year-long of maintenance durvalumab on June 27, 2019.   ASSESSMENT: Recurrent stage IIIa non-small cell lung cancer, right middle lobe.  PLAN:    1.  Recurrent stage IIIa non-small cell lung cancer, right middle lobe: See oncology history above.  PET scan results from November 16, 2021 reviewed independently with once again suspicion of progression of disease.  After lengthy discussion with the patient, it was agreed that no biopsy is necessary and may be too difficult given his recent C2 cervical fracture.  Patient and family have expressed interest in pursuing systemic chemotherapy and will proceed with single agent Taxotere every 3 weeks for 4 cycles with Udenyca support.  Patient tolerated cycle 1 last week relatively well.  He feels the Ellen Henri may have been worse than the chemotherapy.  Return to clinic in 2 weeks for further evaluation and consideration of cycle 2.  Will reimage with PET scan at the conclusion of cycle 4.   2.  Iron deficiency anemia: Chronic and unchanged.  Proceed with IV Venofer today.   3.  Colon polyps:  Patient has a personal history of greater then 10 adenomatous polyps on his most recent conoloscopy. He does not know of any family history of increased polyps or colon cancer.  Genetic testing to assess for the APC mutation for FAP or AFAP was negative. Continue colonoscopies as per GI. 4. Tobacco Use: Chronic and unchanged.  Patient continues to heavily smoke.  He previously expressed understanding by continuing tobacco use increases his chance of recurrence. 5. Anxiety: Chronic and unchanged.  Continue Valium 10 mg every 12 hours as needed.  Continue treatment and evaluation per primary  care.  IV Ativan has also been added to his premedication regimen. 6.  Hyponatremia: Sodium level worse today.  Continue salt tablets as per nephrology.  7.  Hypothyroidism: Appreciate endocrinology input.  Continue Synthroid as prescribed.  Follow-up with endocrinology as scheduled. 8.  CHF: Patient has an EF of 40%.  Continue evaluation and treatment per cardiology. 9.  Nausea: Patient does not complain of this today.  Continue Zofran as needed. 10.  C2 cervical fracture: Continue follow-up with neurosurgery as scheduled. 11.  Bony ache: Continue Tylenol as recommended. 12.  Mouth sores: Continue Magic mouthwash as prescribed.    Patient expressed understanding and was in agreement with this plan. He also understands that He can call clinic at any time with any questions, concerns, or complaints.    [name], MD   12/04/2021 6:39 PM

## 2021-12-03 ENCOUNTER — Other Ambulatory Visit: Payer: Self-pay

## 2021-12-03 ENCOUNTER — Inpatient Hospital Stay: Payer: BC Managed Care – PPO

## 2021-12-03 ENCOUNTER — Inpatient Hospital Stay: Payer: BC Managed Care – PPO | Admitting: Oncology

## 2021-12-03 ENCOUNTER — Inpatient Hospital Stay: Payer: BC Managed Care – PPO | Attending: Oncology

## 2021-12-03 ENCOUNTER — Encounter: Payer: Self-pay | Admitting: Oncology

## 2021-12-03 ENCOUNTER — Telehealth: Payer: Self-pay | Admitting: *Deleted

## 2021-12-03 VITALS — BP 125/68 | HR 106 | Temp 97.6°F | Wt 196.4 lb

## 2021-12-03 VITALS — BP 113/73 | HR 99 | Temp 97.5°F

## 2021-12-03 DIAGNOSIS — K1379 Other lesions of oral mucosa: Secondary | ICD-10-CM | POA: Insufficient documentation

## 2021-12-03 DIAGNOSIS — E039 Hypothyroidism, unspecified: Secondary | ICD-10-CM | POA: Diagnosis not present

## 2021-12-03 DIAGNOSIS — Z5111 Encounter for antineoplastic chemotherapy: Secondary | ICD-10-CM | POA: Insufficient documentation

## 2021-12-03 DIAGNOSIS — C342 Malignant neoplasm of middle lobe, bronchus or lung: Secondary | ICD-10-CM | POA: Diagnosis present

## 2021-12-03 DIAGNOSIS — F17210 Nicotine dependence, cigarettes, uncomplicated: Secondary | ICD-10-CM | POA: Diagnosis not present

## 2021-12-03 DIAGNOSIS — Z923 Personal history of irradiation: Secondary | ICD-10-CM | POA: Insufficient documentation

## 2021-12-03 DIAGNOSIS — I509 Heart failure, unspecified: Secondary | ICD-10-CM | POA: Insufficient documentation

## 2021-12-03 DIAGNOSIS — D751 Secondary polycythemia: Secondary | ICD-10-CM

## 2021-12-03 DIAGNOSIS — F419 Anxiety disorder, unspecified: Secondary | ICD-10-CM | POA: Diagnosis not present

## 2021-12-03 DIAGNOSIS — C3491 Malignant neoplasm of unspecified part of right bronchus or lung: Secondary | ICD-10-CM

## 2021-12-03 DIAGNOSIS — Z79899 Other long term (current) drug therapy: Secondary | ICD-10-CM | POA: Diagnosis not present

## 2021-12-03 DIAGNOSIS — I110 Hypertensive heart disease with heart failure: Secondary | ICD-10-CM | POA: Insufficient documentation

## 2021-12-03 DIAGNOSIS — E871 Hypo-osmolality and hyponatremia: Secondary | ICD-10-CM | POA: Diagnosis not present

## 2021-12-03 DIAGNOSIS — D509 Iron deficiency anemia, unspecified: Secondary | ICD-10-CM | POA: Insufficient documentation

## 2021-12-03 LAB — COMPREHENSIVE METABOLIC PANEL
ALT: 17 U/L (ref 0–44)
AST: 17 U/L (ref 15–41)
Albumin: 3.3 g/dL — ABNORMAL LOW (ref 3.5–5.0)
Alkaline Phosphatase: 113 U/L (ref 38–126)
Anion gap: 6 (ref 5–15)
BUN: 6 mg/dL — ABNORMAL LOW (ref 8–23)
CO2: 25 mmol/L (ref 22–32)
Calcium: 8.2 mg/dL — ABNORMAL LOW (ref 8.9–10.3)
Chloride: 91 mmol/L — ABNORMAL LOW (ref 98–111)
Creatinine, Ser: 0.59 mg/dL — ABNORMAL LOW (ref 0.61–1.24)
GFR, Estimated: >60 mL/min (ref >60)
Glucose, Bld: 108 mg/dL — ABNORMAL HIGH (ref 70–99)
Potassium: 3.8 mmol/L (ref 3.5–5.1)
Sodium: 122 mmol/L — ABNORMAL LOW (ref 135–145)
Total Bilirubin: 0.2 mg/dL — ABNORMAL LOW (ref 0.3–1.2)
Total Protein: 6.4 g/dL — ABNORMAL LOW (ref 6.5–8.1)

## 2021-12-03 LAB — CBC WITH DIFFERENTIAL/PLATELET
Abs Immature Granulocytes: 1.87 10*3/uL — ABNORMAL HIGH (ref 0.00–0.07)
Basophils Absolute: 0 10*3/uL (ref 0.0–0.1)
Basophils Relative: 0 %
Eosinophils Absolute: 0.3 10*3/uL (ref 0.0–0.5)
Eosinophils Relative: 2 %
HCT: 32.1 % — ABNORMAL LOW (ref 39.0–52.0)
Hemoglobin: 10.4 g/dL — ABNORMAL LOW (ref 13.0–17.0)
Immature Granulocytes: 11 %
Lymphocytes Relative: 4 %
Lymphs Abs: 0.7 10*3/uL (ref 0.7–4.0)
MCH: 29.8 pg (ref 26.0–34.0)
MCHC: 32.4 g/dL (ref 30.0–36.0)
MCV: 92 fL (ref 80.0–100.0)
Monocytes Absolute: 1.2 10*3/uL — ABNORMAL HIGH (ref 0.1–1.0)
Monocytes Relative: 7 %
Neutro Abs: 13.1 10*3/uL — ABNORMAL HIGH (ref 1.7–7.7)
Neutrophils Relative %: 76 %
Platelets: 293 10*3/uL (ref 150–400)
RBC: 3.49 MIL/uL — ABNORMAL LOW (ref 4.22–5.81)
RDW: 15.3 % (ref 11.5–15.5)
Smear Review: NORMAL
WBC: 17.2 10*3/uL — ABNORMAL HIGH (ref 4.0–10.5)
nRBC: 0.1 % (ref 0.0–0.2)

## 2021-12-03 MED ORDER — IRON SUCROSE 20 MG/ML IV SOLN
200.0000 mg | Freq: Once | INTRAVENOUS | Status: AC
  Administered 2021-12-03: 200 mg via INTRAVENOUS
  Filled 2021-12-03: qty 10

## 2021-12-03 MED ORDER — SODIUM CHLORIDE 0.9 % IV SOLN: 200.0000 mg | Freq: Once | INTRAVENOUS | Status: DC

## 2021-12-03 MED ORDER — HEPARIN SOD (PORK) LOCK FLUSH 100 UNIT/ML IV SOLN
500.0000 [IU] | Freq: Once | INTRAVENOUS | Status: AC
  Administered 2021-12-03: 500 [IU] via INTRAVENOUS
  Filled 2021-12-03: qty 5

## 2021-12-03 MED ORDER — HEPARIN SOD (PORK) LOCK FLUSH 100 UNIT/ML IV SOLN
INTRAVENOUS | Status: AC
  Filled 2021-12-03: qty 5

## 2021-12-03 MED ORDER — SODIUM CHLORIDE 0.9 % IV SOLN
Freq: Once | INTRAVENOUS | Status: AC
  Filled 2021-12-03: qty 250

## 2021-12-03 NOTE — Telephone Encounter (Signed)
Pt/pt's wife brought in a disability claim form for Dr. Grayland Ormond to sign. Form completed and a copy of original form and patient's path report/ last office note given to patient. Pt will need the documentation to prove that he needs ongoing/permanent disability. Copy of completed form to be sent to HIM dept to be scanned into pt's chart.

## 2021-12-03 NOTE — Progress Notes (Signed)
Pt is having some pain in his mouth with white bumps. He has tried the magic mouth wash and it is not helping. Pt said it looks more like thrush.

## 2021-12-03 NOTE — Patient Instructions (Signed)

## 2021-12-04 ENCOUNTER — Telehealth: Payer: Self-pay | Admitting: Internal Medicine

## 2021-12-04 ENCOUNTER — Encounter: Payer: Self-pay | Admitting: Oncology

## 2021-12-04 NOTE — Telephone Encounter (Signed)
Trelegy coupon placed in outgoing mail per Robin(DPR) request.  Nothing further needed at this time.

## 2021-12-10 ENCOUNTER — Other Ambulatory Visit
Admission: RE | Admit: 2021-12-10 | Discharge: 2021-12-10 | Disposition: A | Discharge status: Home / Self Care | Payer: BC Managed Care – PPO | Attending: Nephrology | Admitting: Nephrology

## 2021-12-10 DIAGNOSIS — E871 Hypo-osmolality and hyponatremia: Secondary | ICD-10-CM | POA: Diagnosis not present

## 2021-12-10 LAB — BASIC METABOLIC PANEL
Anion gap: 7 (ref 5–15)
BUN: 10 mg/dL (ref 8–23)
CO2: 27 mmol/L (ref 22–32)
Calcium: 8.7 mg/dL — ABNORMAL LOW (ref 8.9–10.3)
Chloride: 91 mmol/L — ABNORMAL LOW (ref 98–111)
Creatinine, Ser: 0.76 mg/dL (ref 0.61–1.24)
GFR, Estimated: >60 mL/min (ref >60)
Glucose, Bld: 114 mg/dL — ABNORMAL HIGH (ref 70–99)
Potassium: 4.3 mmol/L (ref 3.5–5.1)
Sodium: 125 mmol/L — ABNORMAL LOW (ref 135–145)

## 2021-12-14 NOTE — Progress Notes (Signed)
[place]  [phone] [phone]  ID: Paul Carpenter OB: April 01, 1956  MR#: [id number]  YSA#:[id number]  Patient Care Team: [name], MD as PCP - General (Internal Medicine) [name], MD as Referring Physician (Vascular Surgery) [name], MD as Consulting Physician (Oncology) [name], MD as Referring Physician (Radiation Oncology)  CHIEF COMPLAINT: Recurrent stage IIIa non-small cell lung cancer, right middle lobe.  INTERVAL HISTORY: Patient returns to clinic today for further evaluation and consideration of cycle 2 of Taxotere.  He was seen in symptom management clinic earlier this week with increased weakness and fatigue and a persistently low sodium level.  He feels improved today, but not back to baseline.  He continues to have a poor appetite.  He continues to have chronic cough.  He continues to smoke and consume alcohol.  He has no neurologic complaints.  He denies any fevers.  He denies any chest pain, shortness of breath, or hemoptysis. He denies any vomiting, constipation, or diarrhea. He has no urinary complaints.  Patient offers no further specific complaints today.  REVIEW OF SYSTEMS:   Review of Systems  Constitutional:  Positive for malaise/fatigue. Negative for fever and weight loss.  HENT: Negative.  Negative for congestion and sore throat.   Respiratory:  Positive for cough. Negative for hemoptysis and shortness of breath.   Cardiovascular: Negative.  Negative for chest pain and leg swelling.  Gastrointestinal: Negative.  Negative for abdominal pain, blood in stool, melena and nausea.  Genitourinary: Negative.  Negative for dysuria.  Musculoskeletal:  Positive for neck pain. Negative for joint pain.  Skin: Negative.  Negative for rash.  Neurological:  Positive for weakness. Negative for dizziness, sensory change, focal weakness and headaches.  Psychiatric/Behavioral:  The patient is nervous/anxious.     As per HPI. Otherwise, a complete review of systems is negative.  PAST MEDICAL HISTORY: Past Medical History:  Diagnosis Date   Anginal pain ([place])    Anxiety    Asthma    Chest pain    CHF (congestive heart failure) (HCC)    Chicken pox    Complication of anesthesia    o2 dropped after neck fusion   COPD (chronic obstructive pulmonary disease) (HCC)    Coronary artery disease    Cough    chronic  clear phlegm   Dysrhythmia    palpitations   GERD (gastroesophageal reflux disease)    h/o reflux/ hoarsness   Hematochezia    Hemorrhoids    History of chickenpox    History of colon polyps    History of Helicobacter pylori infection    Hoarseness    Hypertension    Lung cancer ([place]) 05/2016   Chemo + rad tx's.    Migraines    OSA (obstructive sleep apnea)    has CPAP but does not use   Personal history of tobacco use, presenting hazards to health 03/05/2016   Pneumonia    5/17   Raynaud disease    Raynaud disease    Raynaud's disease    Rotator cuff tear    on right   Shortness of breath dyspnea    Sleep apnea    Ulcer (traumatic) of oral mucosa     PAST SURGICAL HISTORY: Past Surgical History:  Procedure Laterality Date   BACK SURGERY     cervical fusion x 2   CARDIAC CATHETERIZATION     CERVICAL DISCECTOMY     x 2   COLONOSCOPY  COLONOSCOPY N/A 07/25/2015   Procedure: COLONOSCOPY;  Surgeon: Lollie Sails, MD;  Location: [facility] ENDOSCOPY;  Service: Endoscopy;  Laterality: N/A;   COLONOSCOPY WITH PROPOFOL N/A 10/04/2017   Procedure: COLONOSCOPY WITH PROPOFOL;  Surgeon: Lollie Sails, MD;  Location: [facility] ENDOSCOPY;  Service: Endoscopy;  Laterality: N/A;   COLONOSCOPY WITH PROPOFOL N/A 07/10/2020   Procedure: COLONOSCOPY WITH PROPOFOL;  Surgeon: Toledo, Benay Pike, MD;  Location: [facility] ENDOSCOPY;  Service: Gastroenterology;  Laterality: N/A;   ELECTROMAGNETIC NAVIGATION BROCHOSCOPY Left 06/28/2016   Procedure: ELECTROMAGNETIC NAVIGATION BRONCHOSCOPY;   Surgeon: Vilinda Boehringer, MD;  Location: [facility] ORS;  Service: Cardiopulmonary;  Laterality: Left;   ENDOBRONCHIAL ULTRASOUND N/A 04/11/2018   Procedure: ENDOBRONCHIAL ULTRASOUND;  Surgeon: [name], MD;  Location: [facility] ORS;  Service: Cardiopulmonary;  Laterality: N/A;   ESOPHAGOGASTRODUODENOSCOPY N/A 07/25/2015   Procedure: ESOPHAGOGASTRODUODENOSCOPY (EGD);  Surgeon: Lollie Sails, MD;  Location: [facility] ENDOSCOPY;  Service: Endoscopy;  Laterality: N/A;   ESOPHAGOGASTRODUODENOSCOPY (EGD) WITH PROPOFOL N/A 07/10/2020   Procedure: ESOPHAGOGASTRODUODENOSCOPY (EGD) WITH PROPOFOL;  Surgeon: Toledo, Benay Pike, MD;  Location: [facility] ENDOSCOPY;  Service: Gastroenterology;  Laterality: N/A;   NASAL SINUS SURGERY     x 2    PORTA CATH INSERTION N/A 04/24/2018   Procedure: PORTA CATH INSERTION;  Surgeon: Algernon Huxley, MD;  Location: [facility];  Service: Cardiovascular;  Laterality: N/A;   rotator cuff surgery Right    07/2016   SEPTOPLASTY     SKIN GRAFT      FAMILY HISTORY Family History  Problem Relation Age of Onset   Heart disease Father    Prostate cancer Father    Heart disease Paternal Grandmother    Heart attack Maternal Grandfather 62   Kidney cancer Neg Hx    Bladder Cancer Neg Hx    Other Neg Hx        pituitary abnormality       ADVANCED DIRECTIVES:    HEALTH MAINTENANCE: Social History   Tobacco Use   Smoking status: Every Day    Packs/day: 3.00    Years: 50.00    Pack years: 150.00    Types: Cigarettes   Smokeless tobacco: Never   Tobacco comments:    1.5-2 PPD 11/19/2021  Vaping Use   Vaping Use: Never used  Substance Use Topics   Alcohol use: Yes    Alcohol/week: 2.0 standard drinks    Types: 2 Standard drinks or equivalent per week    Comment: moderate   Drug use: No     Allergies  Allergen Reactions   Lisinopril Rash   Varenicline Rash    Current Outpatient Medications  Medication Sig Dispense Refill   acetaminophen (TYLENOL) 500 MG tablet  Take 500 mg by mouth daily as needed.     albuterol (PROAIR HFA) 108 (90 Base) MCG/ACT inhaler Inhale 2 puffs into the lungs every 4 (four) hours as needed for wheezing or shortness of breath. 1 Inhaler 1   atorvastatin (LIPITOR) 40 MG tablet Take 40 mg by mouth at bedtime.      diazepam (VALIUM) 5 MG tablet Take 5 mg by mouth at bedtime as needed.     ferrous sulfate 325 (65 FE) MG tablet Take 325 mg by mouth at bedtime.     Fluticasone-Umeclidin-Vilant (TRELEGY ELLIPTA) 200-62.5-25 MCG/INH AEPB Inhale 200 mcg into the lungs daily. 28 each 0   ipratropium-albuterol (DUONEB) 0.5-2.5 (3) MG/3ML SOLN Take 3 mLs by nebulization every 6 (six) hours as needed. 360 mL 5  levalbuterol (XOPENEX HFA) 45 MCG/ACT inhaler Inhale 1 puff into the lungs every 6 (six) hours as needed for wheezing. 1 each 5   levothyroxine (SYNTHROID) 150 MCG tablet Take 150 mcg by mouth daily before breakfast.     lidocaine-prilocaine (EMLA) cream Apply 1 application topically as needed. 30 g 1   magnesium oxide (MAG-OX) 400 MG tablet Take 600 mg by mouth 2 (two) times daily. Takes 1.5 tablet twice daily     midodrine (PROAMATINE) 5 MG tablet as needed.     nitroGLYCERIN (NITROSTAT) 0.4 MG SL tablet Place under the tongue.     ondansetron (ZOFRAN) 8 MG tablet Take 1 tablet (8 mg total) by mouth every 8 (eight) hours as needed for nausea or vomiting. 30 tablet 1   OXYGEN Inhale 2 L into the lungs at bedtime.     pantoprazole (PROTONIX) 40 MG tablet Take 40 mg by mouth 2 (two) times daily.     potassium chloride (KLOR-CON) 10 MEQ tablet Take 1 tablet by mouth daily.     prochlorperazine (COMPAZINE) 10 MG tablet Take 1 tablet (10 mg total) by mouth every 6 (six) hours as needed for nausea or vomiting. 60 tablet 3   sodium chloride 1 g tablet Take 1 tablet (1 g total) by mouth 3 (three) times daily with meals. 90 tablet 1   tadalafil (CIALIS) 5 MG tablet Take 1 tablet (5 mg total) by mouth daily as needed for erectile dysfunction.  90 tablet 3   metoprolol succinate (TOPROL-XL) 25 MG 24 hr tablet Take 12.5 mg by mouth daily. (Patient not taking: Reported on 12/17/2021)     No current facility-administered medications for this visit.    OBJECTIVE: Vitals:   12/17/21 0934  BP: 118/74  Temp: 97.8 F (36.6 C)       Body mass index is 26.66 kg/m.    ECOG FS:2 - Symptomatic, <50% confined to bed  General: Ill-appearing, no acute distress. Eyes: Pink conjunctiva, anicteric sclera. HEENT: Normocephalic, moist mucous membranes. Lungs: No audible wheezing or coughing. Heart: Regular rate and rhythm. Abdomen: Soft, nontender, no obvious distention. Musculoskeletal: No edema, cyanosis, or clubbing. Neuro: Alert, answering all questions appropriately. Cranial nerves grossly intact. Skin: No rashes or petechiae noted. Psych: Normal affect.  LAB RESULTS:  Lab Results  Component Value Date   NA 123 (L) 12/17/2021   K 3.7 12/17/2021   CL 88 (L) 12/17/2021   CO2 25 12/17/2021   GLUCOSE 140 (H) 12/17/2021   BUN 8 12/17/2021   CREATININE 0.57 (L) 12/17/2021   CALCIUM 8.6 (L) 12/17/2021   PROT 6.7 12/17/2021   ALBUMIN 3.2 (L) 12/17/2021   AST 24 12/17/2021   ALT 27 12/17/2021   ALKPHOS 95 12/17/2021   BILITOT <0.1 (L) 12/17/2021   GFRNONAA >60 12/17/2021   GFRAA >60 04/24/2020    Lab Results  Component Value Date   WBC 9.7 12/17/2021   NEUTROABS 8.0 (H) 12/17/2021   HGB 10.8 (L) 12/17/2021   HCT 32.0 (L) 12/17/2021   MCV 93.3 12/17/2021   PLT 272 12/17/2021   Lab Results  Component Value Date   IRON 32 (L) 11/20/2021   TIBC 277 11/20/2021   IRONPCTSAT 12 (L) 11/20/2021   Lab Results  Component Value Date   FERRITIN 318 11/20/2021     STUDIES:  ONCOLOGY HISTORY: Case discussed with pathologist and unable to determine whether this is adenocarcinoma or squamous cell carcinoma.  There is also insufficient tissue to do further testing.  Liquid  biopsy did not reveal any actionable mutations.  MRI of  the brain completed on Mar 28, 2018 reviewed independently did not reveal metastatic disease.  Patient completed XRT June 26, 2018.  He completed his concurrent single agent carboplatinum on June 21, 2018.  Patient had a reaction to Taxol during cycle 1 and this was discontinued.  He completed a year-long of maintenance durvalumab on June 27, 2019.   ASSESSMENT: Recurrent stage IIIa non-small cell lung cancer, right middle lobe.  PLAN:    1.  Recurrent stage IIIa non-small cell lung cancer, right middle lobe: See oncology history above.  PET scan results from November 16, 2021 reviewed independently with once again suspicion of progression of disease.  After lengthy discussion with the patient, it was agreed that no biopsy is necessary and may be too difficult given his recent C2 cervical fracture.  Patient and family have expressed interest in pursuing systemic chemotherapy and will proceed with single agent Taxotere every 3 weeks for 4 cycles with Udenyca support.  Despite decreasing performance status, will proceed with cycle 2 of Taxotere today.  Return to clinic tomorrow for [facility] and then in 3 weeks for further evaluation and consideration of cycle 3.  Will reimage with PET scan at the conclusion of cycle 4.   2.  Iron deficiency anemia: Improved.  Patient's hemoglobin is now 10.8.  He last received IV Venofer on December 03, 2021.   3.  Colon polyps:  Patient has a personal history of greater then 10 adenomatous polyps on his most recent conoloscopy. He does not know of any family history of increased polyps or colon cancer.  Genetic testing to assess for the APC mutation for FAP or AFAP was negative. Continue colonoscopies as per GI. 4. Tobacco Use: Chronic and unchanged.  Patient continues to heavily smoke.  He previously expressed understanding by continuing tobacco use increases his chance of recurrence. 5. Anxiety: Chronic and unchanged.  Continue Valium 10 mg every 12 hours as needed.   Continue treatment and evaluation per primary care.  IV Ativan has also been added to his premedication regimen. 6.  Hyponatremia: Sodium has improved to 123 today.  Patient was counseled on alcohol consumption and how they can act as a diuretic and reduce sodium levels.  Continue salt tablets as per nephrology.  7.  Hypothyroidism: Appreciate endocrinology input.  Continue Synthroid as prescribed.  Follow-up with endocrinology as scheduled. 8.  CHF: Patient has an EF of 40%.  Continue evaluation and treatment per cardiology. 9.  Nausea: Patient does not complain of this today.  Continue Zofran as needed. 10.  C2 cervical fracture: Improving.  Continue follow-up with neurosurgery as scheduled. 11.  Bony aches: Continue Tylenol as recommended. 12.  Mouth sores: Patient does not complain of this today.  Continue Magic mouthwash as needed.    Patient expressed understanding and was in agreement with this plan. He also understands that He can call clinic at any time with any questions, concerns, or complaints.    [name], MD   12/18/2021 9:53 AM

## 2021-12-15 ENCOUNTER — Inpatient Hospital Stay (HOSPITAL_BASED_OUTPATIENT_CLINIC_OR_DEPARTMENT_OTHER): Payer: BC Managed Care – PPO | Admitting: Hospice and Palliative Medicine

## 2021-12-15 ENCOUNTER — Telehealth: Payer: Self-pay | Admitting: *Deleted

## 2021-12-15 ENCOUNTER — Other Ambulatory Visit: Payer: Self-pay

## 2021-12-15 ENCOUNTER — Inpatient Hospital Stay: Payer: BC Managed Care – PPO

## 2021-12-15 VITALS — BP 130/80 | HR 92 | Temp 98.7°F | Resp 16

## 2021-12-15 DIAGNOSIS — C3491 Malignant neoplasm of unspecified part of right bronchus or lung: Secondary | ICD-10-CM

## 2021-12-15 DIAGNOSIS — Z95828 Presence of other vascular implants and grafts: Secondary | ICD-10-CM

## 2021-12-15 DIAGNOSIS — Z5111 Encounter for antineoplastic chemotherapy: Secondary | ICD-10-CM | POA: Diagnosis not present

## 2021-12-15 DIAGNOSIS — E871 Hypo-osmolality and hyponatremia: Secondary | ICD-10-CM | POA: Diagnosis not present

## 2021-12-15 LAB — CBC WITH DIFFERENTIAL/PLATELET
Abs Immature Granulocytes: 0.1 10*3/uL — ABNORMAL HIGH (ref 0.00–0.07)
Basophils Absolute: 0.1 10*3/uL (ref 0.0–0.1)
Basophils Relative: 1 %
Eosinophils Absolute: 0 10*3/uL (ref 0.0–0.5)
Eosinophils Relative: 0 %
HCT: 31.3 % — ABNORMAL LOW (ref 39.0–52.0)
Hemoglobin: 10.4 g/dL — ABNORMAL LOW (ref 13.0–17.0)
Immature Granulocytes: 1 %
Lymphocytes Relative: 6 %
Lymphs Abs: 0.6 10*3/uL — ABNORMAL LOW (ref 0.7–4.0)
MCH: 30.4 pg (ref 26.0–34.0)
MCHC: 33.2 g/dL (ref 30.0–36.0)
MCV: 91.5 fL (ref 80.0–100.0)
Monocytes Absolute: 1.2 10*3/uL — ABNORMAL HIGH (ref 0.1–1.0)
Monocytes Relative: 11 %
Neutro Abs: 8.6 10*3/uL — ABNORMAL HIGH (ref 1.7–7.7)
Neutrophils Relative %: 81 %
Platelets: 300 10*3/uL (ref 150–400)
RBC: 3.42 MIL/uL — ABNORMAL LOW (ref 4.22–5.81)
RDW: 16.2 % — ABNORMAL HIGH (ref 11.5–15.5)
WBC: 10.6 10*3/uL — ABNORMAL HIGH (ref 4.0–10.5)
nRBC: 0 % (ref 0.0–0.2)

## 2021-12-15 LAB — COMPREHENSIVE METABOLIC PANEL
ALT: 15 U/L (ref 0–44)
AST: 14 U/L — ABNORMAL LOW (ref 15–41)
Albumin: 3.2 g/dL — ABNORMAL LOW (ref 3.5–5.0)
Alkaline Phosphatase: 98 U/L (ref 38–126)
Anion gap: 10 (ref 5–15)
BUN: 9 mg/dL (ref 8–23)
CO2: 25 mmol/L (ref 22–32)
Calcium: 8.5 mg/dL — ABNORMAL LOW (ref 8.9–10.3)
Chloride: 85 mmol/L — ABNORMAL LOW (ref 98–111)
Creatinine, Ser: 0.58 mg/dL — ABNORMAL LOW (ref 0.61–1.24)
GFR, Estimated: >60 mL/min (ref >60)
Glucose, Bld: 104 mg/dL — ABNORMAL HIGH (ref 70–99)
Potassium: 4 mmol/L (ref 3.5–5.1)
Sodium: 120 mmol/L — ABNORMAL LOW (ref 135–145)
Total Bilirubin: 0.3 mg/dL (ref 0.3–1.2)
Total Protein: 6.6 g/dL (ref 6.5–8.1)

## 2021-12-15 MED ORDER — SODIUM CHLORIDE 0.9% FLUSH
10.0000 mL | Freq: Once | INTRAVENOUS | Status: AC
  Administered 2021-12-15: 10 mL via INTRAVENOUS
  Filled 2021-12-15: qty 10

## 2021-12-15 MED ORDER — HEPARIN SOD (PORK) LOCK FLUSH 100 UNIT/ML IV SOLN
500.0000 [IU] | Freq: Once | INTRAVENOUS | Status: AC
  Administered 2021-12-15: 500 [IU] via INTRAVENOUS
  Filled 2021-12-15: qty 5

## 2021-12-15 MED ORDER — SODIUM CHLORIDE 0.9 % IV SOLN
INTRAVENOUS | Status: AC
  Filled 2021-12-15: qty 250

## 2021-12-15 NOTE — Telephone Encounter (Signed)
Duplicate encounter. Pt's wife has already spoken with triage regarding weakness/fatigue.

## 2021-12-15 NOTE — Telephone Encounter (Signed)
Paul Carpenter called reporting that patient is very weak and lethargic, can hardly stay awake. She states he is to have a treatment Thursday, but wandering if he should have his labs checked today. Please advise

## 2021-12-15 NOTE — Progress Notes (Signed)
Symptom Management [place] at [facility] [phone] [phone]  Patient Care Team: [name], MD as PCP - General (Internal Medicine) [name], MD as Referring Physician (Vascular Surgery) [name], MD as Consulting Physician (Oncology) [name], MD as Referring Physician (Radiation Oncology)   Name of the patient: Paul Carpenter  [id number]  June 14, 1956   Date of visit: 12/15/21  Reason for Consult: Paul Carpenter is a 66 y.o. male with multiple medical problems including recurrent stage IIIa non-small cell lung cancer status post previous XRT and chemotherapy and maintenance immunotherapy.   PMH is also notable for iron deficiency anemia, chronic hyponatremia, hypothyroidism, COPD, CHF with EF of 40%, and recent C2 cervical fracture after a fall on 10/28/2021.  Patient was hospitalized in November 2022 and again in January 2023 for hyponatremia secondary to SIADH.  Patient is status post cycle 1 Taxotere on 11/26/2021 and he received Venofer 200 mg. Patient has follow-up with Dr. Grayland Ormond on 12/03/2021 and received a second dose of Venofer.  Patient presents to [facility] today for evaluation of fatigue.  He reports that he has several days of feeling fatigued with very little energy.  He is napping often during the day and awake much of the night.  He denies fever or chills.  He denies GI or GU symptoms.  He has chronic cough and shortness of breath with ongoing smoking but this is unchanged in characteristic or severity.  Appetite has been poor today but has been fairly robust recently per family.  Denies any neurologic complaints. Denies recent fevers or illnesses. Denies any easy bleeding or bruising. Reports fair appetite. Denies chest pain. Denies any nausea, vomiting, constipation, or diarrhea. Denies urinary complaints. Patient offers no further specific complaints today.  PAST MEDICAL HISTORY: Past  Medical History:  Diagnosis Date   Anginal pain ([place])    Anxiety    Asthma    Chest pain    CHF (congestive heart failure) (HCC)    Chicken pox    Complication of anesthesia    o2 dropped after neck fusion   COPD (chronic obstructive pulmonary disease) (HCC)    Coronary artery disease    Cough    chronic  clear phlegm   Dysrhythmia    palpitations   GERD (gastroesophageal reflux disease)    h/o reflux/ hoarsness   Hematochezia    Hemorrhoids    History of chickenpox    History of colon polyps    History of Helicobacter pylori infection    Hoarseness    Hypertension    Lung cancer ([place]) 05/2016   Chemo + rad tx's.    Migraines    OSA (obstructive sleep apnea)    has CPAP but does not use   Personal history of tobacco use, presenting hazards to health 03/05/2016   Pneumonia    5/17   Raynaud disease    Raynaud disease    Raynaud's disease    Rotator cuff tear    on right   Shortness of breath dyspnea    Sleep apnea    Ulcer (traumatic) of oral mucosa     PAST SURGICAL HISTORY:  Past Surgical History:  Procedure Laterality Date   BACK SURGERY     cervical fusion x 2   CARDIAC CATHETERIZATION     CERVICAL DISCECTOMY     x 2   COLONOSCOPY     COLONOSCOPY N/A 07/25/2015   Procedure: COLONOSCOPY;  Surgeon: Lollie Sails,  MD;  Location: [facility] ENDOSCOPY;  Service: Endoscopy;  Laterality: N/A;   COLONOSCOPY WITH PROPOFOL N/A 10/04/2017   Procedure: COLONOSCOPY WITH PROPOFOL;  Surgeon: Lollie Sails, MD;  Location: [facility] ENDOSCOPY;  Service: Endoscopy;  Laterality: N/A;   COLONOSCOPY WITH PROPOFOL N/A 07/10/2020   Procedure: COLONOSCOPY WITH PROPOFOL;  Surgeon: Toledo, Benay Pike, MD;  Location: [facility] ENDOSCOPY;  Service: Gastroenterology;  Laterality: N/A;   ELECTROMAGNETIC NAVIGATION BROCHOSCOPY Left 06/28/2016   Procedure: ELECTROMAGNETIC NAVIGATION BRONCHOSCOPY;  Surgeon: Vilinda Boehringer, MD;  Location: [facility] ORS;  Service: Cardiopulmonary;  Laterality: Left;    ENDOBRONCHIAL ULTRASOUND N/A 04/11/2018   Procedure: ENDOBRONCHIAL ULTRASOUND;  Surgeon: [name], MD;  Location: [facility] ORS;  Service: Cardiopulmonary;  Laterality: N/A;   ESOPHAGOGASTRODUODENOSCOPY N/A 07/25/2015   Procedure: ESOPHAGOGASTRODUODENOSCOPY (EGD);  Surgeon: Lollie Sails, MD;  Location: [facility] ENDOSCOPY;  Service: Endoscopy;  Laterality: N/A;   ESOPHAGOGASTRODUODENOSCOPY (EGD) WITH PROPOFOL N/A 07/10/2020   Procedure: ESOPHAGOGASTRODUODENOSCOPY (EGD) WITH PROPOFOL;  Surgeon: Toledo, Benay Pike, MD;  Location: [facility] ENDOSCOPY;  Service: Gastroenterology;  Laterality: N/A;   NASAL SINUS SURGERY     x 2    PORTA CATH INSERTION N/A 04/24/2018   Procedure: PORTA CATH INSERTION;  Surgeon: Algernon Huxley, MD;  Location: [facility] CV LAB;  Service: Cardiovascular;  Laterality: N/A;   rotator cuff surgery Right    07/2016   SEPTOPLASTY     SKIN GRAFT      HEMATOLOGY/ONCOLOGY HISTORY:  Oncology History  Non-small cell lung cancer, right ([place])  04/24/2018 Initial Diagnosis   Non-small cell lung cancer, right ([place])   04/28/2018 Cancer Staging   Staging form: Lung, AJCC 8th Edition - Clinical stage from 04/28/2018: Stage IIIA (cT1b, cN2, cM0) - Signed by [name], MD on 04/28/2018    05/03/2018 - 06/21/2018 Chemotherapy   The patient had palonosetron (ALOXI) injection 0.25 mg, 0.25 mg, Intravenous,  Once, 8 of 8 cycles Administration: 0.25 mg (05/03/2018), 0.25 mg (05/11/2018), 0.25 mg (06/07/2018), 0.25 mg (06/14/2018), 0.25 mg (06/21/2018), 0.25 mg (05/17/2018), 0.25 mg (05/24/2018), 0.25 mg (05/31/2018) CARBOplatin (PARAPLATIN) 300 mg in sodium chloride 0.9 % 100 mL chemo infusion, 300 mg (100 % of original dose 299.4 mg), Intravenous,  Once, 8 of 8 cycles Dose modification:   (original dose 299.4 mg, Cycle 1) Administration: 300 mg (05/11/2018), 300 mg (06/07/2018), 300 mg (06/14/2018), 300 mg (06/21/2018), 300 mg (05/17/2018), 300 mg (05/24/2018), 300 mg (05/31/2018) PACLitaxel (TAXOL) 96 mg  in sodium chloride 0.9 % 250 mL chemo infusion (</= 80mg /m2), 45 mg/m2 = 96 mg, Intravenous,  Once, 1 of 1 cycle Administration: 96 mg (05/03/2018)   for chemotherapy treatment.     07/12/2018 - 06/27/2019 Chemotherapy   Patient is on Treatment Plan : LUNG DURVALUMAB Z00F     11/26/2021 -  Chemotherapy   Patient is on Treatment Plan : LUNG Docetaxel q21d       ALLERGIES:  is allergic to lisinopril and varenicline.  MEDICATIONS:  Current Outpatient Medications  Medication Sig Dispense Refill   acetaminophen (TYLENOL) 500 MG tablet Take 500 mg by mouth daily as needed.     albuterol (PROAIR HFA) 108 (90 Base) MCG/ACT inhaler Inhale 2 puffs into the lungs every 4 (four) hours as needed for wheezing or shortness of breath. 1 Inhaler 1   atorvastatin (LIPITOR) 40 MG tablet Take 40 mg by mouth at bedtime.      diazepam (VALIUM) 5 MG tablet Take 5 mg by mouth at bedtime as needed.  ferrous sulfate 325 (65 FE) MG tablet Take 325 mg by mouth at bedtime.     Fluticasone-Umeclidin-Vilant (TRELEGY ELLIPTA) 200-62.5-25 MCG/INH AEPB Inhale 200 mcg into the lungs daily. 28 each 0   ipratropium-albuterol (DUONEB) 0.5-2.5 (3) MG/3ML SOLN Take 3 mLs by nebulization every 6 (six) hours as needed. 360 mL 5   levalbuterol (XOPENEX HFA) 45 MCG/ACT inhaler Inhale 1 puff into the lungs every 6 (six) hours as needed for wheezing. 1 each 5   levothyroxine (SYNTHROID) 150 MCG tablet Take 150 mcg by mouth daily before breakfast.     lidocaine-prilocaine (EMLA) cream Apply 1 application topically as needed. 30 g 1   magnesium oxide (MAG-OX) 400 MG tablet Take 600 mg by mouth 2 (two) times daily. Takes 1.5 tablet twice daily     metoprolol succinate (TOPROL-XL) 25 MG 24 hr tablet Take 12.5 mg by mouth daily.     midodrine (PROAMATINE) 5 MG tablet as needed.     nitroGLYCERIN (NITROSTAT) 0.4 MG SL tablet Place under the tongue.     ondansetron (ZOFRAN) 8 MG tablet Take 1 tablet (8 mg total) by mouth every 8 (eight)  hours as needed for nausea or vomiting. 30 tablet 1   OXYGEN Inhale 2 L into the lungs at bedtime.     pantoprazole (PROTONIX) 40 MG tablet Take 40 mg by mouth 2 (two) times daily.     potassium chloride (KLOR-CON) 10 MEQ tablet Take 1 tablet by mouth daily.     prochlorperazine (COMPAZINE) 10 MG tablet Take 1 tablet (10 mg total) by mouth every 6 (six) hours as needed for nausea or vomiting. 60 tablet 3   sodium chloride 1 g tablet Take 1 tablet (1 g total) by mouth 3 (three) times daily with meals. 90 tablet 1   tadalafil (CIALIS) 5 MG tablet Take 1 tablet (5 mg total) by mouth daily as needed for erectile dysfunction. 90 tablet 3   No current facility-administered medications for this visit.    VITAL SIGNS: There were no vitals taken for this visit. There were no vitals filed for this visit.  Estimated body mass index is 26.64 kg/m as calculated from the following:   Height as of 11/19/21: 6' (1.829 m).   Weight as of 12/03/21: 196 lb 6.4 oz (89.1 kg).  LABS: CBC:    Component Value Date/Time   WBC 17.2 (H) 12/03/2021 0903   HGB 10.4 (L) 12/03/2021 0903   HGB 13.4 12/14/2018 1040   HCT 32.1 (L) 12/03/2021 0903   HCT 37.1 (L) 12/14/2018 1040   PLT 293 12/03/2021 0903   PLT 228 07/08/2017 0807   MCV 92.0 12/03/2021 0903   MCV 93 07/08/2017 0807   MCV 97 06/03/2014 1236   NEUTROABS 13.1 (H) 12/03/2021 0903   NEUTROABS 6.0 07/08/2017 0807   NEUTROABS 9.1 (H) 06/03/2014 1236   LYMPHSABS 0.7 12/03/2021 0903   LYMPHSABS 1.8 07/08/2017 0807   LYMPHSABS 2.2 06/03/2014 1236   MONOABS 1.2 (H) 12/03/2021 0903   MONOABS 0.8 06/03/2014 1236   EOSABS 0.3 12/03/2021 0903   EOSABS 0.1 07/08/2017 0807   EOSABS 0.2 06/03/2014 1236   BASOSABS 0.0 12/03/2021 0903   BASOSABS 0.0 07/08/2017 0807   BASOSABS 0.2 (H) 06/03/2014 1236   Comprehensive Metabolic Panel:    Component Value Date/Time   NA 125 (L) 12/10/2021 1044   NA 136 12/25/2012 0951   K 4.3 12/10/2021 1044   K 4.0 12/25/2012  0951   CL 91 (L) 12/10/2021 1044  CL 102 12/25/2012 0951   CO2 27 12/10/2021 1044   CO2 31 12/25/2012 0951   BUN 10 12/10/2021 1044   BUN 10 12/25/2012 0951   CREATININE 0.76 12/10/2021 1044   CREATININE 0.88 12/25/2012 0951   GLUCOSE 114 (H) 12/10/2021 1044   GLUCOSE 86 12/25/2012 0951   CALCIUM 8.7 (L) 12/10/2021 1044   CALCIUM 8.9 12/25/2012 0951   AST 17 12/03/2021 0903   ALT 17 12/03/2021 0903   ALKPHOS 113 12/03/2021 0903   BILITOT 0.2 (L) 12/03/2021 0903   PROT 6.4 (L) 12/03/2021 0903   ALBUMIN 3.3 (L) 12/03/2021 0903    RADIOGRAPHIC STUDIES: NM PET Image Restage (PS) Skull Base to Thigh (F-18 FDG)  Result Date: 11/16/2021 CLINICAL DATA:  Initial treatment strategy for non-small-cell lung cancer, last radiation therapy 10/18/2021. EXAM: NUCLEAR MEDICINE PET SKULL BASE TO THIGH TECHNIQUE: 9.8 mCi F-18 FDG was injected intravenously. Full-ring PET imaging was performed from the skull base to thigh after the radiotracer. CT data was obtained and used for attenuation correction and anatomic localization. Fasting blood glucose: 81 mg/dl COMPARISON:  11/13/2021 abdominopelvic CT. Outside chest CT from 10/28/2021. Prior PET 06/02/2021 FINDINGS: Mediastinal blood pool activity: SUV max 1.6 Liver activity: SUV max NA NECK: No areas of abnormal hypermetabolism. Incidental CT findings: Bilateral carotid atherosclerosis. Mild mucosal thickening of both maxillary sinuses. No cervical adenopathy. CHEST: No thoracic nodal hypermetabolism. The right apical/upper lobe cavitary process is hypermetabolic. When measured in a similar level to on the prior CT, 7.9 x 6.2 cm and a S.U.V. max of 12.0 on 80/3. Compare 8.8 x 5.6 cm on the prior CT. a S.U.V. max of 3.5 on the prior PET. Bilateral hypermetabolic pulmonary nodules. A left upper lobe pulmonary nodule measures 1.8 cm and a S.U.V. max of 8.0 on 104/3, new since 10/28/2021. A right lower lobe pulmonary nodule measures 1.1 cm and a S.U.V. max of 5.7 on  126/3, new since 10/28/2021. Centrilobular emphysema. A pattern of right greater than left and lower lobe predominant micronodularity is felt to be slightly progressive, especially in the right lower lobe. Incidental CT findings: No pleural fluid. Right Port-A-Cath tip low SVC. Aortic and coronary artery calcification. ABDOMEN/PELVIS: No abdominopelvic nodal hypermetabolism. Nonspecific prostatic hypermetabolism at a S.U.V. max of 3.0. Incidental CT findings:Normal adrenal glands. Upper pole left renal 4 mm collecting system calculus. High left hepatic lobe cysts. Abdominal aortic atherosclerosis. Mild prostatomegaly. SKELETON: No abnormal marrow activity. Incidental CT findings: Cervical spine fixation. IMPRESSION: 1. Similar size of a cavitary process in the right upper lobe compared to 10/28/2021. Increased size and hypermetabolism compared to the PET of 06/02/2021. Morphology could represent chronic atypical infection and/or neoplasm. 2. Hypermetabolic pulmonary nodules, many of which are new compared to the CT of 10/28/2021. Especially given the more diffuse micronodularity, these are favored to be infectious/inflammatory. 3. No evidence of hypermetabolic extrathoracic metastasis. 4. Aortic atherosclerosis (ICD10-I70.0), coronary artery atherosclerosis and emphysema (ICD10-J43.9). Electronically Signed   By: Abigail Miyamoto M.D.   On: 11/16/2021 13:13    PERFORMANCE STATUS (ECOG) : 2 - Symptomatic, <50% confined to bed  Review of Systems Unless otherwise noted, a complete review of systems is negative.  Physical Exam General: NAD Cardiovascular: regular rate and rhythm Pulmonary: clear anterior/posterior fields Abdomen: soft, nontender, + bowel sounds GU: no suprapubic tenderness Extremities: no edema, no joint deformities Skin: no rashes Neurological: Weakness but otherwise nonfocal  Assessment and Plan- Patient is a 66 y.o. male  including recurrent stage IIIa non-small cell lung  cancer status  post previous XRT and chemotherapy and maintenance immunotherapy.  Patient presents to [facility] for evaluation of fatigue.  Fatigue -likely multifactorial from cancer/chemotherapy/insomnia.  However, labs are notable today for moderately worse hyponatremia (sodium of 120), which is also likely contributing to fatigue.  Patient is on fluid restriction and oral salt tabs.  He admits to not adhering to volume restrictions.  Additionally, he admits to having several bourbon drinks daily over the weekend.  I recommended ED but patient declined.  He would like to try adhering to fluid restriction and limiting alcohol intake.  Patient is scheduled to follow-up with [place] later this week and we can recheck labs. Continue follow up with nephrology.  Case and plan discussed with Dr. Grayland Ormond    Patient expressed understanding and was in agreement with this plan. He also understands that He can call clinic at any time with any questions, concerns, or complaints.   Thank you for allowing me to participate in the care of this very pleasant patient.   Time Total: 25 minutes  Visit consisted of counseling and education dealing with the complex and emotionally intense issues of symptom management in the setting of serious illness.Greater than 50%  of this time was spent counseling and coordinating care related to the above assessment and plan.  Signed by: Altha Harm, PhD, NP-C

## 2021-12-15 NOTE — Addendum Note (Signed)
Addended by: Jonnie Finner D on: 12/15/2021 04:38 PM   Modules accepted: Orders

## 2021-12-15 NOTE — Telephone Encounter (Signed)
Pt's wife has spoken to me as well regarding the same concerns.

## 2021-12-15 NOTE — Telephone Encounter (Signed)
Spoke with wife. She stated that patient is tired, weak, and wants to sleep a lot, but not lethargic or unarousable. Wife agreed to bring pt in for labs and to be seen in [facility] this afternoon. Appointments scheduled.

## 2021-12-15 NOTE — Progress Notes (Signed)
Pt in [facility] today with concerns of extreme fatigue and weakness. He states that he feels like he could just sleep all day. Reports that appetite is down, but he is drinking fluids. Also reports that he feels "staggery", but denies any recent falls.

## 2021-12-17 ENCOUNTER — Encounter: Payer: Self-pay | Admitting: Oncology

## 2021-12-17 ENCOUNTER — Inpatient Hospital Stay: Payer: BC Managed Care – PPO

## 2021-12-17 ENCOUNTER — Other Ambulatory Visit: Payer: Self-pay

## 2021-12-17 ENCOUNTER — Inpatient Hospital Stay: Payer: BC Managed Care – PPO | Admitting: Oncology

## 2021-12-17 VITALS — BP 118/74 | Temp 97.8°F | Wt 196.6 lb

## 2021-12-17 VITALS — HR 96

## 2021-12-17 DIAGNOSIS — C3491 Malignant neoplasm of unspecified part of right bronchus or lung: Secondary | ICD-10-CM

## 2021-12-17 DIAGNOSIS — Z5111 Encounter for antineoplastic chemotherapy: Secondary | ICD-10-CM | POA: Diagnosis not present

## 2021-12-17 LAB — COMPREHENSIVE METABOLIC PANEL
ALT: 27 U/L (ref 0–44)
AST: 24 U/L (ref 15–41)
Albumin: 3.2 g/dL — ABNORMAL LOW (ref 3.5–5.0)
Alkaline Phosphatase: 95 U/L (ref 38–126)
Anion gap: 10 (ref 5–15)
BUN: 8 mg/dL (ref 8–23)
CO2: 25 mmol/L (ref 22–32)
Calcium: 8.6 mg/dL — ABNORMAL LOW (ref 8.9–10.3)
Chloride: 88 mmol/L — ABNORMAL LOW (ref 98–111)
Creatinine, Ser: 0.57 mg/dL — ABNORMAL LOW (ref 0.61–1.24)
GFR, Estimated: >60 mL/min (ref >60)
Glucose, Bld: 140 mg/dL — ABNORMAL HIGH (ref 70–99)
Potassium: 3.7 mmol/L (ref 3.5–5.1)
Sodium: 123 mmol/L — ABNORMAL LOW (ref 135–145)
Total Bilirubin: <0.1 mg/dL — ABNORMAL LOW (ref 0.3–1.2)
Total Protein: 6.7 g/dL (ref 6.5–8.1)

## 2021-12-17 LAB — CBC WITH DIFFERENTIAL/PLATELET
Abs Immature Granulocytes: 0.06 10*3/uL (ref 0.00–0.07)
Basophils Absolute: 0.1 10*3/uL (ref 0.0–0.1)
Basophils Relative: 1 %
Eosinophils Absolute: 0.1 10*3/uL (ref 0.0–0.5)
Eosinophils Relative: 1 %
HCT: 32 % — ABNORMAL LOW (ref 39.0–52.0)
Hemoglobin: 10.8 g/dL — ABNORMAL LOW (ref 13.0–17.0)
Immature Granulocytes: 1 %
Lymphocytes Relative: 5 %
Lymphs Abs: 0.4 10*3/uL — ABNORMAL LOW (ref 0.7–4.0)
MCH: 31.5 pg (ref 26.0–34.0)
MCHC: 33.8 g/dL (ref 30.0–36.0)
MCV: 93.3 fL (ref 80.0–100.0)
Monocytes Absolute: 1 10*3/uL (ref 0.1–1.0)
Monocytes Relative: 10 %
Neutro Abs: 8 10*3/uL — ABNORMAL HIGH (ref 1.7–7.7)
Neutrophils Relative %: 82 %
Platelets: 272 10*3/uL (ref 150–400)
RBC: 3.43 MIL/uL — ABNORMAL LOW (ref 4.22–5.81)
RDW: 16.1 % — ABNORMAL HIGH (ref 11.5–15.5)
WBC: 9.7 10*3/uL (ref 4.0–10.5)
nRBC: 0 % (ref 0.0–0.2)

## 2021-12-17 MED ORDER — LORAZEPAM 2 MG/ML IJ SOLN
0.5000 mg | Freq: Once | INTRAMUSCULAR | Status: AC
  Administered 2021-12-17: 0.5 mg via INTRAVENOUS
  Filled 2021-12-17: qty 1

## 2021-12-17 MED ORDER — SODIUM CHLORIDE 0.9 % IV SOLN
Freq: Once | INTRAVENOUS | Status: AC
  Filled 2021-12-17: qty 250

## 2021-12-17 MED ORDER — SODIUM CHLORIDE 0.9 % IV SOLN
10.0000 mg | Freq: Once | INTRAVENOUS | Status: AC
  Administered 2021-12-17: 10 mg via INTRAVENOUS
  Filled 2021-12-17: qty 10

## 2021-12-17 MED ORDER — SODIUM CHLORIDE 0.9 % IV SOLN
75.0000 mg/m2 | Freq: Once | INTRAVENOUS | Status: AC
  Administered 2021-12-17: 150 mg via INTRAVENOUS
  Filled 2021-12-17: qty 15

## 2021-12-17 MED ORDER — HEPARIN SOD (PORK) LOCK FLUSH 100 UNIT/ML IV SOLN
500.0000 [IU] | Freq: Once | INTRAVENOUS | Status: AC | PRN
  Administered 2021-12-17: 500 [IU]
  Filled 2021-12-17: qty 5

## 2021-12-17 NOTE — Progress Notes (Signed)
Pt said that he is extremely tired and sleepy. He states that after getting 1 small bag of sodium yesterday it seemed to help.

## 2021-12-17 NOTE — Progress Notes (Signed)
Nutrition Assessment   Reason for Assessment:   Patient identified on Malnutrition Screening report.   ASSESSMENT:  66 year old male with recurrent stage III non small cell lung cancer s/p radiation and chemotherapy and maintenance immunotherapy.  Past medical history of iron deficiency anemia, hyponatremia, hypothyroidism, COPD, CHF, cervical fracture after fall 10/28/21.    Met with patient during infusion.  Patient reports that his appetite has fallen off.  Taking a few bites of red velvet cake during visit.  Says he did not care much for lunch.  "I just picked over it."  Says that his wife prepares him 2 eggs, sausage, toast, grits most mornings. Lunch is 1/2 sandwich and dinner is meat and couple of sides.  Says that he has always snacked during the day and eaten a big supper in the evening.  Says that he drinks some ensure during the day.  Patient lethargic/fatigued during visit.  Reports taste alterations.  Says that mouth sores are improved   Medications: Fe sulfate, zofran, compazine, magox, salt tabs   Labs: Na 123, glucose 140   Anthropometrics:   Height: 72 inches Weight: 196 lb 9.6 oz 192 lb on 1/26 189 lb on 1/3 195 lb on 10/15/21 194 lb on 08/18/2021 BMI: 26  Weight increase recently, overall stable   NUTRITION DIAGNOSIS: Inadequate oral intake related to cancer related treatment side effects as evidenced by weight loss initially, now improved and decreased intake   INTERVENTION:  Discussed strategies to help with taste changes.  Handout provided Encouraged small frequent meals/snacks high in protein Encouraged oral nutrition supplements for added calories.  Coupons provided Contact information provided   MONITORING, EVALUATION, GOAL: weight trends, intake   Next Visit: Thursday, March 9 during infusion  Ura Yingling B. Zenia Resides, [place], [place] Registered Dietitian [phone] (mobile)

## 2021-12-17 NOTE — Patient Instructions (Signed)
[facility] CANCER CTR AT [place]  Discharge Instructions: Thank you for choosing [facility] to provide your oncology and hematology care.  If you have a lab appointment with the [facility], please go directly to the [place] and check in at the registration area.  Wear comfortable clothing and clothing appropriate for easy access to any Portacath or PICC line.   We strive to give you quality time with your provider. You may need to reschedule your appointment if you arrive late (15 or more minutes).  Arriving late affects you and other patients whose appointments are after yours.  Also, if you miss three or more appointments without notifying the office, you may be dismissed from the clinic at the providers discretion.      For prescription refill requests, have your pharmacy contact our office and allow 72 hours for refills to be completed.    Today you received the following chemotherapy and/or immunotherapy agents : Taxotere   To help prevent nausea and vomiting after your treatment, we encourage you to take your nausea medication as directed.  BELOW ARE SYMPTOMS THAT SHOULD BE REPORTED IMMEDIATELY: *FEVER GREATER THAN 100.4 F (38 C) OR HIGHER *CHILLS OR SWEATING *NAUSEA AND VOMITING THAT IS NOT CONTROLLED WITH YOUR NAUSEA MEDICATION *UNUSUAL SHORTNESS OF BREATH *UNUSUAL BRUISING OR BLEEDING *URINARY PROBLEMS (pain or burning when urinating, or frequent urination) *BOWEL PROBLEMS (unusual diarrhea, constipation, pain near the anus) TENDERNESS IN MOUTH AND THROAT WITH OR WITHOUT PRESENCE OF ULCERS (sore throat, sores in mouth, or a toothache) UNUSUAL RASH, SWELLING OR PAIN  UNUSUAL VAGINAL DISCHARGE OR ITCHING   Items with * indicate a potential emergency and should be followed up as soon as possible or go to the Emergency Department if any problems should occur.  Please show the CHEMOTHERAPY ALERT CARD or IMMUNOTHERAPY ALERT CARD at check-in to the  Emergency Department and triage nurse.  Should you have questions after your visit or need to cancel or reschedule your appointment, please contact [facility] CANCER [place] AT [place]  [phone] and follow the prompts.  Office hours are 8:00 a.m. to 4:30 p.m. Monday - Friday. Please note that voicemails left after 4:00 p.m. may not be returned until the following business day.  We are closed weekends and major holidays. You have access to a nurse at all times for urgent questions. Please call the main number to the clinic [phone] and follow the prompts.  For any non-urgent questions, you may also contact your provider using MyChart. We now offer e-Visits for anyone 65 and older to request care online for non-urgent symptoms. For details visit [email or web address].   Also download the MyChart app! Go to the app store, search "MyChart", open the app, select [facility], and log in with your MyChart username and password.  Due to Covid, a mask is required upon entering the hospital/clinic. If you do not have a mask, one will be given to you upon arrival. For doctor visits, patients may have 1 support person aged 65 or older with them. For treatment visits, patients cannot have anyone with them due to current Covid guidelines and our immunocompromised population.

## 2021-12-18 ENCOUNTER — Inpatient Hospital Stay: Payer: BC Managed Care – PPO

## 2021-12-18 ENCOUNTER — Encounter: Payer: Self-pay | Admitting: Oncology

## 2021-12-18 DIAGNOSIS — C3491 Malignant neoplasm of unspecified part of right bronchus or lung: Secondary | ICD-10-CM

## 2021-12-18 DIAGNOSIS — Z5111 Encounter for antineoplastic chemotherapy: Secondary | ICD-10-CM | POA: Diagnosis not present

## 2021-12-18 MED ORDER — PEGFILGRASTIM-CBQV 6 MG/0.6ML ~~LOC~~ SOSY
6.0000 mg | PREFILLED_SYRINGE | Freq: Once | SUBCUTANEOUS | Status: AC
  Administered 2021-12-18: 6 mg via SUBCUTANEOUS
  Filled 2021-12-18: qty 0.6

## 2021-12-23 ENCOUNTER — Ambulatory Visit: Payer: BC Managed Care – PPO

## 2021-12-24 ENCOUNTER — Telehealth: Payer: Self-pay

## 2021-12-24 ENCOUNTER — Other Ambulatory Visit: Payer: Self-pay

## 2021-12-24 ENCOUNTER — Inpatient Hospital Stay: Payer: BC Managed Care – PPO

## 2021-12-24 ENCOUNTER — Telehealth: Payer: Self-pay | Admitting: *Deleted

## 2021-12-24 ENCOUNTER — Other Ambulatory Visit: Payer: Self-pay | Admitting: Oncology

## 2021-12-24 DIAGNOSIS — E871 Hypo-osmolality and hyponatremia: Secondary | ICD-10-CM

## 2021-12-24 DIAGNOSIS — Z5111 Encounter for antineoplastic chemotherapy: Secondary | ICD-10-CM | POA: Diagnosis not present

## 2021-12-24 DIAGNOSIS — C3491 Malignant neoplasm of unspecified part of right bronchus or lung: Secondary | ICD-10-CM

## 2021-12-24 LAB — BASIC METABOLIC PANEL
Anion gap: 6 (ref 5–15)
BUN: 8 mg/dL (ref 8–23)
CO2: 28 mmol/L (ref 22–32)
Calcium: 8.8 mg/dL — ABNORMAL LOW (ref 8.9–10.3)
Chloride: 92 mmol/L — ABNORMAL LOW (ref 98–111)
Creatinine, Ser: 0.71 mg/dL (ref 0.61–1.24)
GFR, Estimated: >60 mL/min (ref >60)
Glucose, Bld: 95 mg/dL (ref 70–99)
Potassium: 4.3 mmol/L (ref 3.5–5.1)
Sodium: 126 mmol/L — ABNORMAL LOW (ref 135–145)

## 2021-12-24 NOTE — Telephone Encounter (Signed)
Called patient's wife and informed her of sodium level. IV fluids offered. Wife asked pt if he wanted to come back for fluids, but patient declined.

## 2021-12-24 NOTE — Telephone Encounter (Signed)
Spoke to wife. She will bring pt in for lab and staff will call patient after results are obtained and advise if fluids are needed or not. Pt will return to clinic this afternoon for IV fluids, if warranted. Wife in agreement and verbalized understanding of plan.

## 2021-12-24 NOTE — Telephone Encounter (Signed)
Message from patient daughter Alyse Low who I do not on HIPPA release is calling stating that patient had treatment Thursaday and he is getting weak again, she states she is concerned that his sodium level may be low again and is asking if he can come in to get his labs checked and get NA infusion if needed. Please advise

## 2021-12-28 ENCOUNTER — Other Ambulatory Visit: Payer: Self-pay | Admitting: Emergency Medicine

## 2021-12-28 ENCOUNTER — Other Ambulatory Visit: Payer: Self-pay

## 2021-12-28 ENCOUNTER — Telehealth: Payer: Self-pay | Admitting: *Deleted

## 2021-12-28 ENCOUNTER — Inpatient Hospital Stay: Payer: BC Managed Care – PPO

## 2021-12-28 DIAGNOSIS — Z5111 Encounter for antineoplastic chemotherapy: Secondary | ICD-10-CM | POA: Diagnosis not present

## 2021-12-28 DIAGNOSIS — E871 Hypo-osmolality and hyponatremia: Secondary | ICD-10-CM

## 2021-12-28 DIAGNOSIS — C3491 Malignant neoplasm of unspecified part of right bronchus or lung: Secondary | ICD-10-CM

## 2021-12-28 LAB — CBC WITH DIFFERENTIAL/PLATELET
Abs Immature Granulocytes: 1.09 10*3/uL — ABNORMAL HIGH (ref 0.00–0.07)
Basophils Absolute: 0.2 10*3/uL — ABNORMAL HIGH (ref 0.0–0.1)
Basophils Relative: 1 %
Eosinophils Absolute: 0.1 10*3/uL (ref 0.0–0.5)
Eosinophils Relative: 0 %
HCT: 35.9 % — ABNORMAL LOW (ref 39.0–52.0)
Hemoglobin: 11.7 g/dL — ABNORMAL LOW (ref 13.0–17.0)
Immature Granulocytes: 5 %
Lymphocytes Relative: 3 %
Lymphs Abs: 0.8 10*3/uL (ref 0.7–4.0)
MCH: 30.9 pg (ref 26.0–34.0)
MCHC: 32.6 g/dL (ref 30.0–36.0)
MCV: 94.7 fL (ref 80.0–100.0)
Monocytes Absolute: 1.1 10*3/uL — ABNORMAL HIGH (ref 0.1–1.0)
Monocytes Relative: 5 %
Neutro Abs: 19.7 10*3/uL — ABNORMAL HIGH (ref 1.7–7.7)
Neutrophils Relative %: 86 %
Platelets: 313 10*3/uL (ref 150–400)
RBC: 3.79 MIL/uL — ABNORMAL LOW (ref 4.22–5.81)
RDW: 16.7 % — ABNORMAL HIGH (ref 11.5–15.5)
WBC: 22.9 10*3/uL — ABNORMAL HIGH (ref 4.0–10.5)
nRBC: 0 % (ref 0.0–0.2)

## 2021-12-28 LAB — IRON AND TIBC
Iron: 49 ug/dL (ref 45–182)
Saturation Ratios: 20 % (ref 17.9–39.5)
TIBC: 241 ug/dL — ABNORMAL LOW (ref 250–450)
UIBC: 192 ug/dL

## 2021-12-28 LAB — COMPREHENSIVE METABOLIC PANEL
ALT: 22 U/L (ref 0–44)
AST: 19 U/L (ref 15–41)
Albumin: 3.1 g/dL — ABNORMAL LOW (ref 3.5–5.0)
Alkaline Phosphatase: 122 U/L (ref 38–126)
Anion gap: 8 (ref 5–15)
BUN: 9 mg/dL (ref 8–23)
CO2: 28 mmol/L (ref 22–32)
Calcium: 8.7 mg/dL — ABNORMAL LOW (ref 8.9–10.3)
Chloride: 92 mmol/L — ABNORMAL LOW (ref 98–111)
Creatinine, Ser: 0.7 mg/dL (ref 0.61–1.24)
GFR, Estimated: >60 mL/min (ref >60)
Glucose, Bld: 117 mg/dL — ABNORMAL HIGH (ref 70–99)
Potassium: 4.3 mmol/L (ref 3.5–5.1)
Sodium: 128 mmol/L — ABNORMAL LOW (ref 135–145)
Total Bilirubin: <0.1 mg/dL — ABNORMAL LOW (ref 0.3–1.2)
Total Protein: 6.6 g/dL (ref 6.5–8.1)

## 2021-12-28 LAB — MAGNESIUM: Magnesium: 1.9 mg/dL (ref 1.7–2.4)

## 2021-12-28 NOTE — Progress Notes (Signed)
g

## 2021-12-28 NOTE — Progress Notes (Signed)
Pt requested CBC,CMP, Magnesium and Iron labs. Verbal order received from Dr. Grayland Ormond.

## 2021-12-28 NOTE — Telephone Encounter (Signed)
Orders placed.

## 2021-12-28 NOTE — Telephone Encounter (Signed)
Wife called requesting for patient to come in to have labs checked this morning, specifically his sodium level. Please advise

## 2021-12-28 NOTE — Telephone Encounter (Signed)
Bmet order placed

## 2021-12-30 ENCOUNTER — Ambulatory Visit: Payer: BC Managed Care – PPO | Admitting: Oncology

## 2021-12-30 ENCOUNTER — Ambulatory Visit: Payer: BC Managed Care – PPO | Admitting: Radiation Oncology

## 2021-12-30 ENCOUNTER — Other Ambulatory Visit: Payer: BC Managed Care – PPO

## 2021-12-31 NOTE — Progress Notes (Signed)
[place]  [phone] [phone]  ID: [name] OB: Feb 03, 1956  MR#: [id number]  IWL#:[id number]  Patient Care Team: [name], MD as PCP - General (Internal Medicine) [name], MD as Referring Physician (Vascular Surgery) [name], MD as Consulting Physician (Oncology) [name], MD as Referring Physician (Radiation Oncology)  CHIEF COMPLAINT: Recurrent stage IIIa non-small cell lung cancer, right middle lobe.  INTERVAL HISTORY: Patient returns to clinic today for further evaluation and consideration of cycle 3 of Taxotere.  His performance status has improved and he does not complain of weakness or fatigue today.  He is currently on antibiotics for pneumonia and his breathing has improved as well.  He continues to have chronic cough.  He continues to smoke and consume alcohol.  He has no neurologic complaints.  He denies any fevers.  He denies any chest pain, shortness of breath, or hemoptysis. He denies any vomiting, constipation, or diarrhea. He has no urinary complaints.  Patient offers no further specific complaints today.  REVIEW OF SYSTEMS:   Review of Systems  Constitutional: Negative.  Negative for fever, malaise/fatigue and weight loss.  HENT: Negative.  Negative for congestion and sore throat.   Respiratory:  Positive for cough. Negative for hemoptysis and shortness of breath.   Cardiovascular: Negative.  Negative for chest pain and leg swelling.  Gastrointestinal: Negative.  Negative for abdominal pain, blood in stool, melena and nausea.  Genitourinary: Negative.  Negative for dysuria.  Musculoskeletal: Negative.  Negative for joint pain and neck pain.  Skin: Negative.  Negative for rash.  Neurological: Negative.  Negative for dizziness, sensory change, focal weakness, weakness and headaches.  Psychiatric/Behavioral:  The patient is nervous/anxious.    As per HPI. Otherwise, a complete review of  systems is negative.  PAST MEDICAL HISTORY: Past Medical History:  Diagnosis Date   Anginal pain ([place])    Anxiety    Asthma    Chest pain    CHF (congestive heart failure) (HCC)    Chicken pox    Complication of anesthesia    o2 dropped after neck fusion   COPD (chronic obstructive pulmonary disease) (HCC)    Coronary artery disease    Cough    chronic  clear phlegm   Dysrhythmia    palpitations   GERD (gastroesophageal reflux disease)    h/o reflux/ hoarsness   Hematochezia    Hemorrhoids    History of chickenpox    History of colon polyps    History of Helicobacter pylori infection    Hoarseness    Hypertension    Lung cancer ([place]) 05/2016   Chemo + rad tx's.    Migraines    OSA (obstructive sleep apnea)    has CPAP but does not use   Personal history of tobacco use, presenting hazards to health 03/05/2016   Pneumonia    5/17   Raynaud disease    Raynaud disease    Raynaud's disease    Rotator cuff tear    on right   Shortness of breath dyspnea    Sleep apnea    Ulcer (traumatic) of oral mucosa     PAST SURGICAL HISTORY: Past Surgical History:  Procedure Laterality Date   BACK SURGERY     cervical fusion x 2   CARDIAC CATHETERIZATION     CERVICAL DISCECTOMY     x 2   COLONOSCOPY     COLONOSCOPY N/A 07/25/2015   Procedure: COLONOSCOPY;  Surgeon: Billie Ruddy  Gustavo Lah, MD;  Location: [facility] ENDOSCOPY;  Service: Endoscopy;  Laterality: N/A;   COLONOSCOPY WITH PROPOFOL N/A 10/04/2017   Procedure: COLONOSCOPY WITH PROPOFOL;  Surgeon: Lollie Sails, MD;  Location: [facility] ENDOSCOPY;  Service: Endoscopy;  Laterality: N/A;   COLONOSCOPY WITH PROPOFOL N/A 07/10/2020   Procedure: COLONOSCOPY WITH PROPOFOL;  Surgeon: Toledo, Benay Pike, MD;  Location: [facility] ENDOSCOPY;  Service: Gastroenterology;  Laterality: N/A;   ELECTROMAGNETIC NAVIGATION BROCHOSCOPY Left 06/28/2016   Procedure: ELECTROMAGNETIC NAVIGATION BRONCHOSCOPY;  Surgeon: Vilinda Boehringer, MD;  Location: [facility] ORS;   Service: Cardiopulmonary;  Laterality: Left;   ENDOBRONCHIAL ULTRASOUND N/A 04/11/2018   Procedure: ENDOBRONCHIAL ULTRASOUND;  Surgeon: [name], MD;  Location: [facility] ORS;  Service: Cardiopulmonary;  Laterality: N/A;   ESOPHAGOGASTRODUODENOSCOPY N/A 07/25/2015   Procedure: ESOPHAGOGASTRODUODENOSCOPY (EGD);  Surgeon: Lollie Sails, MD;  Location: [facility] ENDOSCOPY;  Service: Endoscopy;  Laterality: N/A;   ESOPHAGOGASTRODUODENOSCOPY (EGD) WITH PROPOFOL N/A 07/10/2020   Procedure: ESOPHAGOGASTRODUODENOSCOPY (EGD) WITH PROPOFOL;  Surgeon: Toledo, Benay Pike, MD;  Location: [facility] ENDOSCOPY;  Service: Gastroenterology;  Laterality: N/A;   NASAL SINUS SURGERY     x 2    PORTA CATH INSERTION N/A 04/24/2018   Procedure: PORTA CATH INSERTION;  Surgeon: Algernon Huxley, MD;  Location: [place] [facility] LAB;  Service: Cardiovascular;  Laterality: N/A;   rotator cuff surgery Right    07/2016   SEPTOPLASTY     SKIN GRAFT      FAMILY HISTORY Family History  Problem Relation Age of Onset   Heart disease Father    Prostate cancer Father    Heart disease Paternal Grandmother    Heart attack Maternal Grandfather 49   Kidney cancer Neg Hx    Bladder Cancer Neg Hx    Other Neg Hx        pituitary abnormality       ADVANCED DIRECTIVES:    HEALTH MAINTENANCE: Social History   Tobacco Use   Smoking status: Every Day    Packs/day: 3.00    Years: 50.00    Pack years: 150.00    Types: Cigarettes   Smokeless tobacco: Never   Tobacco comments:    1.5-2 PPD 11/19/2021  Vaping Use   Vaping Use: Never used  Substance Use Topics   Alcohol use: Yes    Alcohol/week: 2.0 standard drinks    Types: 2 Standard drinks or equivalent per week    Comment: moderate   Drug use: No     Allergies  Allergen Reactions   Lisinopril Rash   Varenicline Rash    Current Outpatient Medications  Medication Sig Dispense Refill   acetaminophen (TYLENOL) 500 MG tablet Take 500 mg by mouth daily as needed.      albuterol (PROAIR HFA) 108 (90 Base) MCG/ACT inhaler Inhale 2 puffs into the lungs every 4 (four) hours as needed for wheezing or shortness of breath. 1 Inhaler 1   atorvastatin (LIPITOR) 40 MG tablet Take 40 mg by mouth at bedtime.      chlorpheniramine-HYDROcodone 10-8 MG/5ML Take 5 mLs by mouth every 12 (twelve) hours as needed for cough. 70 mL 0   diazepam (VALIUM) 5 MG tablet Take 5 mg by mouth at bedtime as needed.     ferrous sulfate 325 (65 FE) MG tablet Take 325 mg by mouth at bedtime.     Fluticasone-Umeclidin-Vilant (TRELEGY ELLIPTA) 200-62.5-25 MCG/INH AEPB Inhale 200 mcg into the lungs daily. 28 each 0   ipratropium-albuterol (DUONEB) 0.5-2.5 (3) MG/3ML SOLN Take 3 mLs by  nebulization every 6 (six) hours as needed. 360 mL 5   levalbuterol (XOPENEX HFA) 45 MCG/ACT inhaler Inhale 1 puff into the lungs every 6 (six) hours as needed for wheezing. 1 each 5   levofloxacin (LEVAQUIN) 500 MG tablet Take 1 tablet (500 mg total) by mouth daily. 14 tablet 0   levothyroxine (SYNTHROID) 150 MCG tablet Take 150 mcg by mouth daily before breakfast.     lidocaine-prilocaine (EMLA) cream Apply 1 application topically as needed. 30 g 1   magnesium oxide (MAG-OX) 400 MG tablet Take 600 mg by mouth 2 (two) times daily. Takes 1.5 tablet twice daily     midodrine (PROAMATINE) 5 MG tablet as needed.     nitroGLYCERIN (NITROSTAT) 0.4 MG SL tablet Place under the tongue.     ondansetron (ZOFRAN) 8 MG tablet Take 1 tablet (8 mg total) by mouth every 8 (eight) hours as needed for nausea or vomiting. 30 tablet 1   OXYGEN Inhale 2 L into the lungs at bedtime.     pantoprazole (PROTONIX) 40 MG tablet Take 40 mg by mouth 2 (two) times daily.     potassium chloride (KLOR-CON) 10 MEQ tablet Take 1 tablet by mouth daily.     predniSONE (DELTASONE) 10 MG tablet Take 4 tablets (40mg ) daily x 3 days, then take 3 tablets (30mg ) daily x 3 days, then take 2 tablets (20mg ) daily x 3 days, then take 1 tablet (10mg ) daily x 3  days then stop 30 tablet 0   prochlorperazine (COMPAZINE) 10 MG tablet Take 1 tablet (10 mg total) by mouth every 6 (six) hours as needed for nausea or vomiting. 60 tablet 3   sodium chloride 1 g tablet Take 1 tablet (1 g total) by mouth 3 (three) times daily with meals. 90 tablet 1   tadalafil (CIALIS) 5 MG tablet Take 1 tablet (5 mg total) by mouth daily as needed for erectile dysfunction. 90 tablet 3   No current facility-administered medications for this visit.   Facility-Administered Medications Ordered in Other Visits  Medication Dose Route Frequency Provider Last Rate Last Admin   sodium chloride flush (NS) 0.9 % injection 10 mL  10 mL Intracatheter PRN [name], MD        OBJECTIVE: Vitals:   01/07/22 0939  BP: 129/84  Pulse: 94  Resp: 18  Temp: (!) 95.9 F (35.5 C)  SpO2: 98%       Body mass index is 26.94 kg/m.    ECOG FS:1 - Symptomatic but completely ambulatory  General: Well-developed, well-nourished, no acute distress. Eyes: Pink conjunctiva, anicteric sclera. HEENT: Normocephalic, moist mucous membranes. Lungs: No audible wheezing or coughing. Heart: Regular rate and rhythm. Abdomen: Soft, nontender, no obvious distention. Musculoskeletal: No edema, cyanosis, or clubbing. Neuro: Alert, answering all questions appropriately. Cranial nerves grossly intact. Skin: No rashes or petechiae noted. Psych: Normal affect.   LAB RESULTS:  Lab Results  Component Value Date   NA 130 (L) 01/07/2022   K 3.8 01/07/2022   CL 93 (L) 01/07/2022   CO2 27 01/07/2022   GLUCOSE 114 (H) 01/07/2022   BUN 11 01/07/2022   CREATININE 0.66 01/07/2022   CALCIUM 8.8 (L) 01/07/2022   PROT 6.4 (L) 01/07/2022   ALBUMIN 3.2 (L) 01/07/2022   AST 19 01/07/2022   ALT 21 01/07/2022   ALKPHOS 83 01/07/2022   BILITOT 0.2 (L) 01/07/2022   GFRNONAA >60 01/07/2022   GFRAA >60 04/24/2020    Lab Results  Component Value Date  WBC 14.4 (H) 01/07/2022   NEUTROABS 13.3 (H)  01/07/2022   HGB 10.5 (L) 01/07/2022   HCT 32.1 (L) 01/07/2022   MCV 94.1 01/07/2022   PLT 345 01/07/2022   Lab Results  Component Value Date   IRON 49 12/28/2021   TIBC 241 (L) 12/28/2021   IRONPCTSAT 20 12/28/2021   Lab Results  Component Value Date   FERRITIN 318 11/20/2021     STUDIES:  ONCOLOGY HISTORY: Case discussed with pathologist and unable to determine whether this is adenocarcinoma or squamous cell carcinoma.  There is also insufficient tissue to do further testing.  Liquid biopsy did not reveal any actionable mutations.  MRI of the brain completed on Mar 28, 2018 reviewed independently did not reveal metastatic disease.  Patient completed XRT June 26, 2018.  He completed his concurrent single agent carboplatinum on June 21, 2018.  Patient had a reaction to Taxol during cycle 1 and this was discontinued.  He completed a year-long of maintenance durvalumab on June 27, 2019.   ASSESSMENT: Recurrent stage IIIa non-small cell lung cancer, right middle lobe.  PLAN:    1.  Recurrent stage IIIa non-small cell lung cancer, right middle lobe: See oncology history above.  PET scan results from November 16, 2021 reviewed independently with once again suspicion of progression of disease.  After lengthy discussion with the patient, it was agreed that no biopsy is necessary and may be too difficult given his recent C2 cervical fracture.  Patient and family have expressed interest in pursuing systemic chemotherapy and will proceed with single agent Taxotere every 3 weeks for 4 cycles with Udenyca support.  Proceed with cycle 3 of treatment today.  Return to clinic tomorrow for [facility] and then in 3 weeks for further evaluation and consideration of cycle 4.  Will reimage with CT scan at the conclusion of cycle 4.   2.  Iron deficiency anemia: Chronic and unchanged.  Patient's hemoglobin is 10.5 today.  He last received IV Venofer on December 03, 2021.   3.  Colon polyps:  Patient has a  personal history of greater then 10 adenomatous polyps on his most recent conoloscopy. He does not know of any family history of increased polyps or colon cancer.  Genetic testing to assess for the APC mutation for FAP or AFAP was negative. Continue colonoscopies as per GI. 4. Tobacco Use: Chronic and unchanged.  Patient continues to heavily smoke.  He previously expressed understanding by continuing tobacco use increases his chance of recurrence. 5. Anxiety: Chronic and unchanged.  Continue Valium 10 mg every 12 hours as needed.  Continue treatment and evaluation per primary care.  IV Ativan has also been added to his premedication regimen. 6.  Hyponatremia: Sodium improved to 130.  Patient was counseled on alcohol consumption and how they can act as a diuretic and reduce sodium levels.  Continue salt tablets as per nephrology.  7.  Hypothyroidism: Appreciate endocrinology input.  Continue Synthroid as prescribed.  Follow-up with endocrinology as scheduled. 8.  CHF: Patient has an EF of 40%.  Continue evaluation and treatment per cardiology. 9.  Nausea: Patient does not complain of this today.  Continue Zofran as needed. 10.  C2 cervical fracture: Improving.  Continue follow-up with neurosurgery as scheduled. 11.  Bony aches: Continue Tylenol as recommended. 12.  Mouth sores: Patient does not complain of this today.  Continue Magic mouthwash as needed.    Patient expressed understanding and was in agreement with this plan. He also understands  that He can call clinic at any time with any questions, concerns, or complaints.    [name], MD   01/07/2022 1:33 PM

## 2022-01-04 ENCOUNTER — Inpatient Hospital Stay: Payer: BC Managed Care – PPO | Attending: Oncology

## 2022-01-04 ENCOUNTER — Telehealth: Payer: Self-pay

## 2022-01-04 ENCOUNTER — Inpatient Hospital Stay (HOSPITAL_BASED_OUTPATIENT_CLINIC_OR_DEPARTMENT_OTHER): Payer: BC Managed Care – PPO | Admitting: Hospice and Palliative Medicine

## 2022-01-04 ENCOUNTER — Other Ambulatory Visit: Payer: Self-pay

## 2022-01-04 ENCOUNTER — Ambulatory Visit
Admission: RE | Admit: 2022-01-04 | Discharge: 2022-01-04 | Disposition: A | Discharge status: Home / Self Care | Payer: BC Managed Care – PPO | Source: Ambulatory Visit | Attending: Hospice and Palliative Medicine | Admitting: Hospice and Palliative Medicine

## 2022-01-04 ENCOUNTER — Ambulatory Visit
Admission: RE | Admit: 2022-01-04 | Discharge: 2022-01-04 | Disposition: A | Discharge status: Home / Self Care | Payer: BC Managed Care – PPO | Attending: Hospice and Palliative Medicine | Admitting: Hospice and Palliative Medicine

## 2022-01-04 VITALS — BP 133/74 | HR 104 | Temp 99.3°F | Resp 18

## 2022-01-04 DIAGNOSIS — J189 Pneumonia, unspecified organism: Secondary | ICD-10-CM

## 2022-01-04 DIAGNOSIS — Z5189 Encounter for other specified aftercare: Secondary | ICD-10-CM | POA: Diagnosis not present

## 2022-01-04 DIAGNOSIS — E871 Hypo-osmolality and hyponatremia: Secondary | ICD-10-CM | POA: Diagnosis not present

## 2022-01-04 DIAGNOSIS — J449 Chronic obstructive pulmonary disease, unspecified: Secondary | ICD-10-CM | POA: Insufficient documentation

## 2022-01-04 DIAGNOSIS — C3491 Malignant neoplasm of unspecified part of right bronchus or lung: Secondary | ICD-10-CM

## 2022-01-04 DIAGNOSIS — Z5111 Encounter for antineoplastic chemotherapy: Secondary | ICD-10-CM | POA: Diagnosis present

## 2022-01-04 DIAGNOSIS — C3411 Malignant neoplasm of upper lobe, right bronchus or lung: Secondary | ICD-10-CM | POA: Diagnosis not present

## 2022-01-04 DIAGNOSIS — C342 Malignant neoplasm of middle lobe, bronchus or lung: Secondary | ICD-10-CM | POA: Diagnosis present

## 2022-01-04 DIAGNOSIS — D509 Iron deficiency anemia, unspecified: Secondary | ICD-10-CM | POA: Insufficient documentation

## 2022-01-04 DIAGNOSIS — Z79899 Other long term (current) drug therapy: Secondary | ICD-10-CM | POA: Diagnosis not present

## 2022-01-04 DIAGNOSIS — E039 Hypothyroidism, unspecified: Secondary | ICD-10-CM | POA: Insufficient documentation

## 2022-01-04 DIAGNOSIS — I110 Hypertensive heart disease with heart failure: Secondary | ICD-10-CM | POA: Insufficient documentation

## 2022-01-04 DIAGNOSIS — I509 Heart failure, unspecified: Secondary | ICD-10-CM | POA: Diagnosis not present

## 2022-01-04 LAB — CBC WITH DIFFERENTIAL/PLATELET
Abs Immature Granulocytes: 0.07 10*3/uL (ref 0.00–0.07)
Basophils Absolute: 0.1 10*3/uL (ref 0.0–0.1)
Basophils Relative: 1 %
Eosinophils Absolute: 0 10*3/uL (ref 0.0–0.5)
Eosinophils Relative: 0 %
HCT: 31.1 % — ABNORMAL LOW (ref 39.0–52.0)
Hemoglobin: 10.3 g/dL — ABNORMAL LOW (ref 13.0–17.0)
Immature Granulocytes: 1 %
Lymphocytes Relative: 3 %
Lymphs Abs: 0.4 10*3/uL — ABNORMAL LOW (ref 0.7–4.0)
MCH: 31.2 pg (ref 26.0–34.0)
MCHC: 33.1 g/dL (ref 30.0–36.0)
MCV: 94.2 fL (ref 80.0–100.0)
Monocytes Absolute: 1 10*3/uL (ref 0.1–1.0)
Monocytes Relative: 8 %
Neutro Abs: 11.1 10*3/uL — ABNORMAL HIGH (ref 1.7–7.7)
Neutrophils Relative %: 87 %
Platelets: 273 10*3/uL (ref 150–400)
RBC: 3.3 MIL/uL — ABNORMAL LOW (ref 4.22–5.81)
RDW: 16.2 % — ABNORMAL HIGH (ref 11.5–15.5)
WBC: 12.6 10*3/uL — ABNORMAL HIGH (ref 4.0–10.5)
nRBC: 0 % (ref 0.0–0.2)

## 2022-01-04 LAB — COMPREHENSIVE METABOLIC PANEL
ALT: 18 U/L (ref 0–44)
AST: 18 U/L (ref 15–41)
Albumin: 3 g/dL — ABNORMAL LOW (ref 3.5–5.0)
Alkaline Phosphatase: 89 U/L (ref 38–126)
Anion gap: 8 (ref 5–15)
BUN: 9 mg/dL (ref 8–23)
CO2: 28 mmol/L (ref 22–32)
Calcium: 8.6 mg/dL — ABNORMAL LOW (ref 8.9–10.3)
Chloride: 91 mmol/L — ABNORMAL LOW (ref 98–111)
Creatinine, Ser: 0.71 mg/dL (ref 0.61–1.24)
GFR, Estimated: >60 mL/min (ref >60)
Glucose, Bld: 133 mg/dL — ABNORMAL HIGH (ref 70–99)
Potassium: 4.3 mmol/L (ref 3.5–5.1)
Sodium: 127 mmol/L — ABNORMAL LOW (ref 135–145)
Total Bilirubin: 0.6 mg/dL (ref 0.3–1.2)
Total Protein: 6.2 g/dL — ABNORMAL LOW (ref 6.5–8.1)

## 2022-01-04 MED ORDER — HYDROCOD POLI-CHLORPHE POLI ER 10-8 MG/5ML PO SUER: 5.0000 mL | Freq: Two times a day (BID) | ORAL | 0 refills | Status: DC | PRN

## 2022-01-04 MED ORDER — IPRATROPIUM-ALBUTEROL 0.5-2.5 (3) MG/3ML IN SOLN: 3.0000 mL | Freq: Four times a day (QID) | RESPIRATORY_TRACT | 5 refills | Status: DC | PRN

## 2022-01-04 MED ORDER — PREDNISONE 10 MG PO TABS: ORAL_TABLET | ORAL | 0 refills | Status: DC

## 2022-01-04 MED ORDER — LEVOFLOXACIN 500 MG PO TABS: 500.0000 mg | ORAL_TABLET | Freq: Every day | ORAL | 0 refills | Status: DC

## 2022-01-04 NOTE — Progress Notes (Signed)
Pt in [facility] this afternoon with reports of productive cough, fatigue, and shortness of breath x 2 weeks. CXR obtained prior to appointment today. Pt concerned that he has pneumonia.  ?

## 2022-01-04 NOTE — Telephone Encounter (Signed)
Called pt's wife to f/u with message received re: coughing and fevers. Pt's wife informed that per NP, recommend for patient to obtain a chest x-ray today and then be seen in symptom management clinic. Wife agreeable to this. Appointments scheduled.  ?

## 2022-01-04 NOTE — Progress Notes (Signed)
Symptom Management [place] at [facility] Telephone:(336) [phone] Fax:(336) [phone]  Patient Care Team: [name], MD as PCP - General (Internal Medicine) [name], MD as Referring Physician (Vascular Surgery) [name], MD as Consulting Physician (Oncology) [name], MD as Referring Physician (Radiation Oncology)   Name of the patient: Paul Carpenter  [id number]  1956/10/06   Date of visit: 01/04/22  Reason for Consult: Paul Carpenter is a 66 y.o. male with multiple medical problems including recurrent stage IIIa non-small cell lung cancer status post previous XRT and chemotherapy and maintenance immunotherapy.   PMH is also notable for iron deficiency anemia, chronic hyponatremia, hypothyroidism, COPD, CHF with EF of 40%, and recent C2 cervical fracture after a fall on 10/28/2021.  Patient was hospitalized in November 2022 and again in January 2023 for hyponatremia secondary to SIADH.  Patient presents to [facility] for evaluation of cough, shortness of breath, and fever worsening over the past week and a half.  Patient reports that he developed a sinus infection a couple weeks ago which progressed into lower respiratory symptoms.  Denies any neurologic complaints. Denies any easy bleeding or bruising. Reports fair appetite. Denies chest pain. Denies any nausea, vomiting, constipation, or diarrhea. Denies urinary complaints. Patient offers no further specific complaints today.  PAST MEDICAL HISTORY: Past Medical History:  Diagnosis Date   Anginal pain ([place])    Anxiety    Asthma    Chest pain    CHF (congestive heart failure) (HCC)    Chicken pox    Complication of anesthesia    o2 dropped after neck fusion   COPD (chronic obstructive pulmonary disease) (HCC)    Coronary artery disease    Cough    chronic  clear phlegm   Dysrhythmia    palpitations   GERD (gastroesophageal reflux disease)    h/o reflux/ hoarsness    Hematochezia    Hemorrhoids    History of chickenpox    History of colon polyps    History of Helicobacter pylori infection    Hoarseness    Hypertension    Lung cancer ([place]) 05/2016   Chemo + rad tx's.    Migraines    OSA (obstructive sleep apnea)    has CPAP but does not use   Personal history of tobacco use, presenting hazards to health 03/05/2016   Pneumonia    5/17   Raynaud disease    Raynaud disease    Raynaud's disease    Rotator cuff tear    on right   Shortness of breath dyspnea    Sleep apnea    Ulcer (traumatic) of oral mucosa     PAST SURGICAL HISTORY:  Past Surgical History:  Procedure Laterality Date   BACK SURGERY     cervical fusion x 2   CARDIAC CATHETERIZATION     CERVICAL DISCECTOMY     x 2   COLONOSCOPY     COLONOSCOPY N/A 07/25/2015   Procedure: COLONOSCOPY;  Surgeon: Lollie Sails, MD;  Location: [facility] ENDOSCOPY;  Service: Endoscopy;  Laterality: N/A;   COLONOSCOPY WITH PROPOFOL N/A 10/04/2017   Procedure: COLONOSCOPY WITH PROPOFOL;  Surgeon: Lollie Sails, MD;  Location: [facility] ENDOSCOPY;  Service: Endoscopy;  Laterality: N/A;   COLONOSCOPY WITH PROPOFOL N/A 07/10/2020   Procedure: COLONOSCOPY WITH PROPOFOL;  Surgeon: Toledo, Benay Pike, MD;  Location: [facility] ENDOSCOPY;  Service: Gastroenterology;  Laterality: N/A;   ELECTROMAGNETIC NAVIGATION BROCHOSCOPY Left 06/28/2016   Procedure: ELECTROMAGNETIC NAVIGATION  BRONCHOSCOPY;  Surgeon: Vilinda Boehringer, MD;  Location: [facility] ORS;  Service: Cardiopulmonary;  Laterality: Left;   ENDOBRONCHIAL ULTRASOUND N/A 04/11/2018   Procedure: ENDOBRONCHIAL ULTRASOUND;  Surgeon: [name], MD;  Location: [facility] ORS;  Service: Cardiopulmonary;  Laterality: N/A;   ESOPHAGOGASTRODUODENOSCOPY N/A 07/25/2015   Procedure: ESOPHAGOGASTRODUODENOSCOPY (EGD);  Surgeon: Lollie Sails, MD;  Location: [facility] ENDOSCOPY;  Service: Endoscopy;  Laterality: N/A;   ESOPHAGOGASTRODUODENOSCOPY (EGD) WITH PROPOFOL N/A 07/10/2020   Procedure:  ESOPHAGOGASTRODUODENOSCOPY (EGD) WITH PROPOFOL;  Surgeon: Toledo, Benay Pike, MD;  Location: [facility] ENDOSCOPY;  Service: Gastroenterology;  Laterality: N/A;   NASAL SINUS SURGERY     x 2    PORTA CATH INSERTION N/A 04/24/2018   Procedure: PORTA CATH INSERTION;  Surgeon: Algernon Huxley, MD;  Location: [place] CV LAB;  Service: Cardiovascular;  Laterality: N/A;   rotator cuff surgery Right    07/2016   SEPTOPLASTY     SKIN GRAFT      HEMATOLOGY/ONCOLOGY HISTORY:  Oncology History  Non-small cell lung cancer, right ([place])  04/24/2018 Initial Diagnosis   Non-small cell lung cancer, right ([place])   04/28/2018 Cancer Staging   Staging form: Lung, AJCC 8th Edition - Clinical stage from 04/28/2018: Stage IIIA (cT1b, cN2, cM0) - Signed by [name], MD on 04/28/2018   05/03/2018 - 06/21/2018 Chemotherapy   The patient had palonosetron (ALOXI) injection 0.25 mg, 0.25 mg, Intravenous,  Once, 8 of 8 cycles Administration: 0.25 mg (05/03/2018), 0.25 mg (05/11/2018), 0.25 mg (06/07/2018), 0.25 mg (06/14/2018), 0.25 mg (06/21/2018), 0.25 mg (05/17/2018), 0.25 mg (05/24/2018), 0.25 mg (05/31/2018) CARBOplatin (PARAPLATIN) 300 mg in sodium chloride 0.9 % 100 mL chemo infusion, 300 mg (100 % of original dose 299.4 mg), Intravenous,  Once, 8 of 8 cycles Dose modification:   (original dose 299.4 mg, Cycle 1) Administration: 300 mg (05/11/2018), 300 mg (06/07/2018), 300 mg (06/14/2018), 300 mg (06/21/2018), 300 mg (05/17/2018), 300 mg (05/24/2018), 300 mg (05/31/2018) PACLitaxel (TAXOL) 96 mg in sodium chloride 0.9 % 250 mL chemo infusion (</= 80mg /m2), 45 mg/m2 = 96 mg, Intravenous,  Once, 1 of 1 cycle Administration: 96 mg (05/03/2018)  for chemotherapy treatment.    07/12/2018 - 06/27/2019 Chemotherapy   Patient is on Treatment Plan : LUNG DURVALUMAB Z32D     11/26/2021 -  Chemotherapy   Patient is on Treatment Plan : LUNG Docetaxel q21d       ALLERGIES:  is allergic to lisinopril and varenicline.  MEDICATIONS:   Current Outpatient Medications  Medication Sig Dispense Refill   acetaminophen (TYLENOL) 500 MG tablet Take 500 mg by mouth daily as needed.     albuterol (PROAIR HFA) 108 (90 Base) MCG/ACT inhaler Inhale 2 puffs into the lungs every 4 (four) hours as needed for wheezing or shortness of breath. 1 Inhaler 1   atorvastatin (LIPITOR) 40 MG tablet Take 40 mg by mouth at bedtime.      diazepam (VALIUM) 5 MG tablet Take 5 mg by mouth at bedtime as needed.     ferrous sulfate 325 (65 FE) MG tablet Take 325 mg by mouth at bedtime.     Fluticasone-Umeclidin-Vilant (TRELEGY ELLIPTA) 200-62.5-25 MCG/INH AEPB Inhale 200 mcg into the lungs daily. 28 each 0   ipratropium-albuterol (DUONEB) 0.5-2.5 (3) MG/3ML SOLN Take 3 mLs by nebulization every 6 (six) hours as needed. 360 mL 5   levalbuterol (XOPENEX HFA) 45 MCG/ACT inhaler Inhale 1 puff into the lungs every 6 (six) hours as needed for wheezing. 1 each 5  levothyroxine (SYNTHROID) 150 MCG tablet Take 150 mcg by mouth daily before breakfast.     lidocaine-prilocaine (EMLA) cream Apply 1 application topically as needed. 30 g 1   magnesium oxide (MAG-OX) 400 MG tablet Take 600 mg by mouth 2 (two) times daily. Takes 1.5 tablet twice daily     metoprolol succinate (TOPROL-XL) 25 MG 24 hr tablet Take 12.5 mg by mouth daily. (Patient not taking: Reported on 12/17/2021)     midodrine (PROAMATINE) 5 MG tablet as needed.     nitroGLYCERIN (NITROSTAT) 0.4 MG SL tablet Place under the tongue.     ondansetron (ZOFRAN) 8 MG tablet Take 1 tablet (8 mg total) by mouth every 8 (eight) hours as needed for nausea or vomiting. 30 tablet 1   OXYGEN Inhale 2 L into the lungs at bedtime.     pantoprazole (PROTONIX) 40 MG tablet Take 40 mg by mouth 2 (two) times daily.     potassium chloride (KLOR-CON) 10 MEQ tablet Take 1 tablet by mouth daily.     prochlorperazine (COMPAZINE) 10 MG tablet Take 1 tablet (10 mg total) by mouth every 6 (six) hours as needed for nausea or vomiting.  60 tablet 3   sodium chloride 1 g tablet Take 1 tablet (1 g total) by mouth 3 (three) times daily with meals. 90 tablet 1   tadalafil (CIALIS) 5 MG tablet Take 1 tablet (5 mg total) by mouth daily as needed for erectile dysfunction. 90 tablet 3   No current facility-administered medications for this visit.    VITAL SIGNS: There were no vitals taken for this visit. There were no vitals filed for this visit.  Estimated body mass index is 26.66 kg/m as calculated from the following:   Height as of 11/19/21: 6' (1.829 m).   Weight as of 12/17/21: 196 lb 9.6 oz (89.2 kg).  LABS: CBC:    Component Value Date/Time   WBC 22.9 (H) 12/28/2021 1412   HGB 11.7 (L) 12/28/2021 1412   HGB 13.4 12/14/2018 1040   HCT 35.9 (L) 12/28/2021 1412   HCT 37.1 (L) 12/14/2018 1040   PLT 313 12/28/2021 1412   PLT 228 07/08/2017 0807   MCV 94.7 12/28/2021 1412   MCV 93 07/08/2017 0807   MCV 97 06/03/2014 1236   NEUTROABS 19.7 (H) 12/28/2021 1412   NEUTROABS 6.0 07/08/2017 0807   NEUTROABS 9.1 (H) 06/03/2014 1236   LYMPHSABS 0.8 12/28/2021 1412   LYMPHSABS 1.8 07/08/2017 0807   LYMPHSABS 2.2 06/03/2014 1236   MONOABS 1.1 (H) 12/28/2021 1412   MONOABS 0.8 06/03/2014 1236   EOSABS 0.1 12/28/2021 1412   EOSABS 0.1 07/08/2017 0807   EOSABS 0.2 06/03/2014 1236   BASOSABS 0.2 (H) 12/28/2021 1412   BASOSABS 0.0 07/08/2017 0807   BASOSABS 0.2 (H) 06/03/2014 1236   Comprehensive Metabolic Panel:    Component Value Date/Time   NA 128 (L) 12/28/2021 1412   NA 136 12/25/2012 0951   K 4.3 12/28/2021 1412   K 4.0 12/25/2012 0951   CL 92 (L) 12/28/2021 1412   CL 102 12/25/2012 0951   CO2 28 12/28/2021 1412   CO2 31 12/25/2012 0951   BUN 9 12/28/2021 1412   BUN 10 12/25/2012 0951   CREATININE 0.70 12/28/2021 1412   CREATININE 0.88 12/25/2012 0951   GLUCOSE 117 (H) 12/28/2021 1412   GLUCOSE 86 12/25/2012 0951   CALCIUM 8.7 (L) 12/28/2021 1412   CALCIUM 8.9 12/25/2012 0951   AST 19 12/28/2021 1412  ALT 22 12/28/2021 1412   ALKPHOS 122 12/28/2021 1412   BILITOT <0.1 (L) 12/28/2021 1412   PROT 6.6 12/28/2021 1412   ALBUMIN 3.1 (L) 12/28/2021 1412    RADIOGRAPHIC STUDIES: DG Chest 2 View  Result Date: 01/04/2022 CLINICAL DATA:  Productive cough, fever EXAM: CHEST - 2 VIEW COMPARISON:  Previous studies including the examination of 11/03/2021 FINDINGS: Cardiac size is within normal limits. There are no signs of alveolar pulmonary edema. There is decreased volume in right upper lobe with elevation of minor fissure. There is interval decrease in infiltrates in the right upper lung fields. There is interval appearance of small patchy infiltrates in the left parahilar region and increased interstitial markings in the right lower lung fields. Lateral CP angles are clear. There is no pneumothorax. There is previous anterior surgical fusion in the cervical spine. Tip of right IJ chest port is seen in the superior vena cava. IMPRESSION: There is interval improvement in the infiltrate in the right upper lung fields. This may suggest regression of neoplastic process or partial clearing of infectious process. There are new patchy infiltrates in the left parahilar region and right lower lung fields suggesting interval appearance of multifocal pneumonia. Electronically Signed   By: Elmer Picker M.D.   On: 01/04/2022 13:34    PERFORMANCE STATUS (ECOG) : 2 - Symptomatic, <50% confined to bed  Review of Systems Unless otherwise noted, a complete review of systems is negative.  Physical Exam General: NAD Cardiovascular: regular rate and rhythm Pulmonary: clear anterior/posterior fields Abdomen: soft, nontender, + bowel sounds GU: no suprapubic tenderness Extremities: no edema, no joint deformities Skin: no rashes Neurological: Weakness but otherwise nonfocal  Assessment and Plan- Patient is a 66 y.o. male  including recurrent stage IIIa non-small cell lung cancer status post previous XRT and  chemotherapy and maintenance immunotherapy.  Patient presents to [facility] for evaluation of fatigue.  Pneumonia-chest x-ray suggestive of multifocal pneumonia with patchy infiltrates left perihilar region and right lower lung. Will start on Levaquin 500 mg p.o. daily x14 days.  Also started on steroids and antitussives.  Refilled DuoNebs.  Will send for flu/COVID PCR.  I had a lengthy conversation with patient and wife regarding ER precautions/triggers.  I would have a low threshold for sending patient to ER for any worsening symptoms.  Case and plan discussed with Dr. Grayland Ormond.  Patient to follow-up with [place] on Thursday or sooner if needed.    Patient expressed understanding and was in agreement with this plan. He also understands that He can call clinic at any time with any questions, concerns, or complaints.   Thank you for allowing me to participate in the care of this very pleasant patient.   Time Total: 25 minutes  Visit consisted of counseling and education dealing with the complex and emotionally intense issues of symptom management in the setting of serious illness.Greater than 50%  of this time was spent counseling and coordinating care related to the above assessment and plan.  Signed by: Altha Harm, PhD, NP-C

## 2022-01-07 ENCOUNTER — Inpatient Hospital Stay: Payer: BC Managed Care – PPO

## 2022-01-07 ENCOUNTER — Inpatient Hospital Stay: Payer: BC Managed Care – PPO | Admitting: Oncology

## 2022-01-07 ENCOUNTER — Encounter: Payer: Self-pay | Admitting: *Deleted

## 2022-01-07 ENCOUNTER — Other Ambulatory Visit: Payer: Self-pay

## 2022-01-07 VITALS — BP 129/84 | HR 94 | Temp 95.9°F | Resp 18 | Wt 198.6 lb

## 2022-01-07 DIAGNOSIS — C3491 Malignant neoplasm of unspecified part of right bronchus or lung: Secondary | ICD-10-CM | POA: Diagnosis not present

## 2022-01-07 DIAGNOSIS — C3411 Malignant neoplasm of upper lobe, right bronchus or lung: Secondary | ICD-10-CM | POA: Diagnosis not present

## 2022-01-07 LAB — CBC WITH DIFFERENTIAL/PLATELET
Abs Immature Granulocytes: 0.08 10*3/uL — ABNORMAL HIGH (ref 0.00–0.07)
Basophils Absolute: 0.1 10*3/uL (ref 0.0–0.1)
Basophils Relative: 0 %
Eosinophils Absolute: 0 10*3/uL (ref 0.0–0.5)
Eosinophils Relative: 0 %
HCT: 32.1 % — ABNORMAL LOW (ref 39.0–52.0)
Hemoglobin: 10.5 g/dL — ABNORMAL LOW (ref 13.0–17.0)
Immature Granulocytes: 1 %
Lymphocytes Relative: 2 %
Lymphs Abs: 0.3 10*3/uL — ABNORMAL LOW (ref 0.7–4.0)
MCH: 30.8 pg (ref 26.0–34.0)
MCHC: 32.7 g/dL (ref 30.0–36.0)
MCV: 94.1 fL (ref 80.0–100.0)
Monocytes Absolute: 0.7 10*3/uL (ref 0.1–1.0)
Monocytes Relative: 5 %
Neutro Abs: 13.3 10*3/uL — ABNORMAL HIGH (ref 1.7–7.7)
Neutrophils Relative %: 92 %
Platelets: 345 10*3/uL (ref 150–400)
RBC: 3.41 MIL/uL — ABNORMAL LOW (ref 4.22–5.81)
RDW: 16.4 % — ABNORMAL HIGH (ref 11.5–15.5)
WBC: 14.4 10*3/uL — ABNORMAL HIGH (ref 4.0–10.5)
nRBC: 0 % (ref 0.0–0.2)

## 2022-01-07 LAB — COMPREHENSIVE METABOLIC PANEL
ALT: 21 U/L (ref 0–44)
AST: 19 U/L (ref 15–41)
Albumin: 3.2 g/dL — ABNORMAL LOW (ref 3.5–5.0)
Alkaline Phosphatase: 83 U/L (ref 38–126)
Anion gap: 10 (ref 5–15)
BUN: 11 mg/dL (ref 8–23)
CO2: 27 mmol/L (ref 22–32)
Calcium: 8.8 mg/dL — ABNORMAL LOW (ref 8.9–10.3)
Chloride: 93 mmol/L — ABNORMAL LOW (ref 98–111)
Creatinine, Ser: 0.66 mg/dL (ref 0.61–1.24)
GFR, Estimated: >60 mL/min (ref >60)
Glucose, Bld: 114 mg/dL — ABNORMAL HIGH (ref 70–99)
Potassium: 3.8 mmol/L (ref 3.5–5.1)
Sodium: 130 mmol/L — ABNORMAL LOW (ref 135–145)
Total Bilirubin: 0.2 mg/dL — ABNORMAL LOW (ref 0.3–1.2)
Total Protein: 6.4 g/dL — ABNORMAL LOW (ref 6.5–8.1)

## 2022-01-07 MED ORDER — HEPARIN SOD (PORK) LOCK FLUSH 100 UNIT/ML IV SOLN
500.0000 [IU] | Freq: Once | INTRAVENOUS | Status: AC | PRN
  Filled 2022-01-07: qty 5

## 2022-01-07 MED ORDER — SODIUM CHLORIDE 0.9 % IV SOLN
75.0000 mg/m2 | Freq: Once | INTRAVENOUS | Status: AC
  Administered 2022-01-07: 12:00:00 150 mg via INTRAVENOUS
  Filled 2022-01-07: qty 15

## 2022-01-07 MED ORDER — LORAZEPAM 2 MG/ML IJ SOLN
0.5000 mg | Freq: Once | INTRAMUSCULAR | Status: AC
  Administered 2022-01-07: 11:00:00 0.5 mg via INTRAVENOUS
  Filled 2022-01-07: qty 1

## 2022-01-07 MED ORDER — SODIUM CHLORIDE 0.9% FLUSH
10.0000 mL | INTRAVENOUS | Status: DC | PRN
  Filled 2022-01-07: qty 10

## 2022-01-07 MED ORDER — SODIUM CHLORIDE 0.9 % IV SOLN
10.0000 mg | Freq: Once | INTRAVENOUS | Status: AC
  Administered 2022-01-07: 11:00:00 10 mg via INTRAVENOUS
  Filled 2022-01-07: qty 10

## 2022-01-07 MED ORDER — HEPARIN SOD (PORK) LOCK FLUSH 100 UNIT/ML IV SOLN
INTRAVENOUS | Status: AC
  Administered 2022-01-07: 13:00:00 500 [IU]
  Filled 2022-01-07: qty 5

## 2022-01-07 MED ORDER — SODIUM CHLORIDE 0.9 % IV SOLN
Freq: Once | INTRAVENOUS | Status: AC
  Filled 2022-01-07: qty 250

## 2022-01-07 NOTE — Patient Instructions (Signed)
[facility] CANCER CTR AT [place]  Discharge Instructions: Thank you for choosing [facility] to provide your oncology and hematology care.  If you have a lab appointment with the [place], please go directly to the [facility] and check in at the registration area.  Wear comfortable clothing and clothing appropriate for easy access to any Portacath or PICC line.   We strive to give you quality time with your provider. You may need to reschedule your appointment if you arrive late (15 or more minutes).  Arriving late affects you and other patients whose appointments are after yours.  Also, if you miss three or more appointments without notifying the office, you may be dismissed from the clinic at the providers discretion.      For prescription refill requests, have your pharmacy contact our office and allow 72 hours for refills to be completed.    Today you received the following chemotherapy and/or immunotherapy agents - docetaxel      To help prevent nausea and vomiting after your treatment, we encourage you to take your nausea medication as directed.  BELOW ARE SYMPTOMS THAT SHOULD BE REPORTED IMMEDIATELY: *FEVER GREATER THAN 100.4 F (38 C) OR HIGHER *CHILLS OR SWEATING *NAUSEA AND VOMITING THAT IS NOT CONTROLLED WITH YOUR NAUSEA MEDICATION *UNUSUAL SHORTNESS OF BREATH *UNUSUAL BRUISING OR BLEEDING *URINARY PROBLEMS (pain or burning when urinating, or frequent urination) *BOWEL PROBLEMS (unusual diarrhea, constipation, pain near the anus) TENDERNESS IN MOUTH AND THROAT WITH OR WITHOUT PRESENCE OF ULCERS (sore throat, sores in mouth, or a toothache) UNUSUAL RASH, SWELLING OR PAIN  UNUSUAL VAGINAL DISCHARGE OR ITCHING   Items with * indicate a potential emergency and should be followed up as soon as possible or go to the Emergency Department if any problems should occur.  Please show the CHEMOTHERAPY ALERT CARD or IMMUNOTHERAPY ALERT CARD at check-in to  the Emergency Department and triage nurse.  Should you have questions after your visit or need to cancel or reschedule your appointment, please contact [facility] CANCER [place] AT [place]  [phone] and follow the prompts.  Office hours are 8:00 a.m. to 4:30 p.m. Monday - Friday. Please note that voicemails left after 4:00 p.m. may not be returned until the following business day.  We are closed weekends and major holidays. You have access to a nurse at all times for urgent questions. Please call the main number to the clinic [phone] and follow the prompts.  For any non-urgent questions, you may also contact your provider using MyChart. We now offer e-Visits for anyone 52 and older to request care online for non-urgent symptoms. For details visit [email or web address].   Also download the MyChart app! Go to the app store, search "MyChart", open the app, select [facility], and log in with your MyChart username and password.  Due to Covid, a mask is required upon entering the hospital/clinic. If you do not have a mask, one will be given to you upon arrival. For doctor visits, patients may have 1 support person aged 38 or older with them. For treatment visits, patients cannot have anyone with them due to current Covid guidelines and our immunocompromised population.   Docetaxel injection What is this medication? DOCETAXEL (doe se TAX el) is a chemotherapy drug. It targets fast dividing cells, like cancer cells, and causes these cells to die. This medicine is used to treat many types of cancers like breast cancer, certain stomach cancers, head and neck cancer, lung cancer, and prostate cancer.  This medicine may be used for other purposes; ask your health care provider or pharmacist if you have questions. COMMON BRAND NAME(S): Docefrez, Taxotere What should I tell my care team before I take this medication? They need to know if you have any of these conditions: infection (especially a  virus infection such as chickenpox, cold sores, or herpes) liver disease low blood counts, like low white cell, platelet, or red cell counts an unusual or allergic reaction to docetaxel, polysorbate 80, other chemotherapy agents, other medicines, foods, dyes, or preservatives pregnant or trying to get pregnant breast-feeding How should I use this medication? This drug is given as an infusion into a vein. It is administered in a hospital or clinic by a specially trained health care professional. Talk to your pediatrician regarding the use of this medicine in children. Special care may be needed. Overdosage: If you think you have taken too much of this medicine contact a poison control center or emergency room at once. NOTE: This medicine is only for you. Do not share this medicine with others. What if I miss a dose? It is important not to miss your dose. Call your doctor or health care professional if you are unable to keep an appointment. What may interact with this medication? Do not take this medicine with any of the following medications: live virus vaccines This medicine may also interact with the following medications: aprepitant certain antibiotics like erythromycin or clarithromycin certain antivirals for HIV or hepatitis certain medicines for fungal infections like fluconazole, itraconazole, ketoconazole, posaconazole, or voriconazole cimetidine ciprofloxacin conivaptan cyclosporine dronedarone fluvoxamine grapefruit juice imatinib verapamil This list may not describe all possible interactions. Give your health care provider a list of all the medicines, herbs, non-prescription drugs, or dietary supplements you use. Also tell them if you smoke, drink alcohol, or use illegal drugs. Some items may interact with your medicine. What should I watch for while using this medication? Your condition will be monitored carefully while you are receiving this medicine. You will need  important blood work done while you are taking this medicine. Call your doctor or health care professional for advice if you get a fever, chills or sore throat, or other symptoms of a cold or flu. Do not treat yourself. This drug decreases your body's ability to fight infections. Try to avoid being around people who are sick. Some products may contain alcohol. Ask your health care professional if this medicine contains alcohol. Be sure to tell all health care professionals you are taking this medicine. Certain medicines, like metronidazole and disulfiram, can cause an unpleasant reaction when taken with alcohol. The reaction includes flushing, headache, nausea, vomiting, sweating, and increased thirst. The reaction can last from 30 minutes to several hours. You may get drowsy or dizzy. Do not drive, use machinery, or do anything that needs mental alertness until you know how this medicine affects you. Do not stand or sit up quickly, especially if you are an older patient. This reduces the risk of dizzy or fainting spells. Alcohol may interfere with the effect of this medicine. Talk to your health care professional about your risk of cancer. You may be more at risk for certain types of cancer if you take this medicine. Do not become pregnant while taking this medicine or for 6 months after stopping it. Women should inform their doctor if they wish to become pregnant or think they might be pregnant. There is a potential for serious side effects to an unborn child. Talk to  your health care professional or pharmacist for more information. Do not breast-feed an infant while taking this medicine or for 1 week after stopping it. Males who get this medicine must use a condom during sex with females who can get pregnant. If you get a woman pregnant, the baby could have birth defects. The baby could die before they are born. You will need to continue wearing a condom for 3 months after stopping the medicine. Tell your  health care provider right away if your partner becomes pregnant while you are taking this medicine. This may interfere with the ability to father a child. You should talk to your doctor or health care professional if you are concerned about your fertility. What side effects may I notice from receiving this medication? Side effects that you should report to your doctor or health care professional as soon as possible: allergic reactions like skin rash, itching or hives, swelling of the face, lips, or tongue blurred vision breathing problems changes in vision low blood counts - This drug may decrease the number of white blood cells, red blood cells and platelets. You may be at increased risk for infections and bleeding. nausea and vomiting pain, redness or irritation at site where injected pain, tingling, numbness in the hands or feet redness, blistering, peeling, or loosening of the skin, including inside the mouth signs of decreased platelets or bleeding - bruising, pinpoint red spots on the skin, black, tarry stools, nosebleeds signs of decreased red blood cells - unusually weak or tired, fainting spells, lightheadedness signs of infection - fever or chills, cough, sore throat, pain or difficulty passing urine swelling of the ankle, feet, hands Side effects that usually do not require medical attention (report to your doctor or health care professional if they continue or are bothersome): constipation diarrhea fingernail or toenail changes hair loss loss of appetite mouth sores muscle pain This list may not describe all possible side effects. Call your doctor for medical advice about side effects. You may report side effects to FDA at 1-800-FDA-1088. Where should I keep my medication? This drug is given in a hospital or clinic and will not be stored at home. NOTE: This sheet is a summary. It may not cover all possible information. If you have questions about this medicine, talk to your  doctor, pharmacist, or health care provider.  2022 Elsevier/Gold Standard (2021-07-07 00:00:00)

## 2022-01-07 NOTE — Progress Notes (Signed)
No venofer today per Dr. Jeneen Montgomery RN ?

## 2022-01-07 NOTE — Progress Notes (Signed)
Pt recently treated for pneumonia. Feeling much better.  ?

## 2022-01-07 NOTE — Progress Notes (Signed)
Nutrition Follow-up: ? ? ?Patient with recurrent stage III non small cell lung cancer s/p radiation and chemotherapy and maintenance immunotherapy.   ? ?Met with patient during infusion.  Patient reports that he is eating.  Feels better after starting antibiotics and prednisone for pneumonia.  Ate eggs and sausage this am.  Last night for dinner ate tomato soup and peanut butter and jelly sandwich.  Drinking ensure/boost shakes daily.  Says that he will get mouth sores after every treatment and magic mouthwash helps.   ? ? ? ?Medications: reviewed ? ?Labs: reviewed ? ?Anthropometrics:  ? ?Weight 198 lb 9.6 oz on 3/9 ?196 lb 9.6 oz on 2/16 ?192 lb on 1/26 ?189 lb on 1/3 ?195 lb on 10/15/21 ?194 lb on 08/18/21 ? ? ?NUTRITION DIAGNOSIS: Inadequate oral intake stable ? ? ?INTERVENTION:  ?Continue oral nutrition supplements ?Continue high calorie, high protein foods ?Discussed food safety while on treatment.  ?  ? ?MONITORING, EVALUATION, GOAL: weight trends, intake ? ? ?NEXT VISIT: Thursday, March 30 during infusion ? ?Tarance Balan B. Zenia Resides, RD, LDN ?Registered Dietitian ?[phone] (mobile) ? ? ?

## 2022-01-08 ENCOUNTER — Inpatient Hospital Stay: Payer: BC Managed Care – PPO

## 2022-01-08 DIAGNOSIS — C3411 Malignant neoplasm of upper lobe, right bronchus or lung: Secondary | ICD-10-CM | POA: Diagnosis not present

## 2022-01-08 DIAGNOSIS — C3491 Malignant neoplasm of unspecified part of right bronchus or lung: Secondary | ICD-10-CM

## 2022-01-08 MED ORDER — PEGFILGRASTIM-CBQV 6 MG/0.6ML ~~LOC~~ SOSY
6.0000 mg | PREFILLED_SYRINGE | Freq: Once | SUBCUTANEOUS | Status: AC
  Administered 2022-01-08: 6 mg via SUBCUTANEOUS
  Filled 2022-01-08: qty 0.6

## 2022-01-21 ENCOUNTER — Inpatient Hospital Stay: Payer: BC Managed Care – PPO

## 2022-01-22 ENCOUNTER — Other Ambulatory Visit: Payer: Self-pay

## 2022-01-22 ENCOUNTER — Ambulatory Visit
Admission: RE | Admit: 2022-01-22 | Discharge: 2022-01-22 | Disposition: A | Discharge status: Home / Self Care | Payer: BC Managed Care – PPO | Source: Ambulatory Visit | Attending: Hospice and Palliative Medicine | Admitting: Hospice and Palliative Medicine

## 2022-01-22 ENCOUNTER — Ambulatory Visit
Admission: RE | Admit: 2022-01-22 | Discharge: 2022-01-22 | Disposition: A | Discharge status: Home / Self Care | Payer: BC Managed Care – PPO | Attending: Hospice and Palliative Medicine | Admitting: Hospice and Palliative Medicine

## 2022-01-22 ENCOUNTER — Telehealth: Payer: Self-pay | Admitting: *Deleted

## 2022-01-22 DIAGNOSIS — J189 Pneumonia, unspecified organism: Secondary | ICD-10-CM

## 2022-01-22 MED ORDER — PREDNISONE 10 MG PO TABS: ORAL_TABLET | ORAL | 0 refills | Status: DC

## 2022-01-22 MED ORDER — IPRATROPIUM-ALBUTEROL 0.5-2.5 (3) MG/3ML IN SOLN: 3.0000 mL | Freq: Four times a day (QID) | RESPIRATORY_TRACT | 5 refills | Status: DC | PRN

## 2022-01-22 MED ORDER — HYDROCOD POLI-CHLORPHE POLI ER 10-8 MG/5ML PO SUER: 5.0000 mL | Freq: Two times a day (BID) | ORAL | 0 refills | Status: DC | PRN

## 2022-01-22 NOTE — Telephone Encounter (Signed)
Call returned to [place] and she will take patient to get CXR and await return call to see if patient needs more antibiotics. She states he is probably going to need refill of his Albuterol SVN ?

## 2022-01-22 NOTE — Telephone Encounter (Signed)
I spoke with patient and wife by phone.  Together, we reviewed the results of his chest x-ray.  Chest x-ray is without significant interval change.  Discussed that chest x-rays can lag weeks following infection.  Clinically, patient reports that he felt much improved after starting antibiotics and steroids.  He completed 14-day course of Levaquin on 3/19.  Patient denies significant worsening of shortness of breath but states that he is still coughing.  Wife has noted that his SaO2 will at times drop in the low 90s but bounces right back to mid 90s without oxygen.  There certainly may be a COPD component to this as well.  I would be hesitant to restart antibiotics given recent completion of prolonged course.  However, I will restart slow steroid taper/DuoNebs and antitussives.  ER precautions reviewed in detail. ?

## 2022-01-22 NOTE — Telephone Encounter (Signed)
Shirlean Mylar called reporting that patient completed medications for pneumonia a week ago and she has noticed that patient seems to be getting worse again. She states that he is coughing more and that his O2 levels are dipping 90-92%. She states that he has oxygen. She states he has no sign sof low sodium. She is asking if he can at least have a CXR to see if the pneumonia has cleared and perhaps some labs and be seen if deemed necessary. Please advise ?

## 2022-01-22 NOTE — Telephone Encounter (Signed)
IMPRESSION: ?1. Stable heterogeneous opacities in the right upper lobe with ?associated architectural distortion and significant apical capping. ?2. Ill-defined opacities in the left mid lung are also not ?significantly changed since 01/04/2022. ?3. Overall, no significant interval change with no new or worsening ?focal airspace disease. No pleural effusion. ?  ?  ?Electronically Signed ?  By: Valetta Mole M.D. ?  On: 01/22/2022 13:22 ?

## 2022-01-22 NOTE — Telephone Encounter (Signed)
Patient refuses to go to ER. He is not in respiratory distress at this time, but she does not want it to get to that point either ?

## 2022-01-26 NOTE — Progress Notes (Signed)
?[place]  ?[phone] [phone] ? ?ID: [name] OB: [birth year]  MR#: [id number]  SEG#:[id number] ? ?Patient Care Team: ?[name], MD as PCP - General (Internal Medicine) ?Algernon Huxley, MD as Referring Physician (Vascular Surgery) ?[name], MD as Consulting Physician (Oncology) ?[name], MD as Referring Physician (Radiation Oncology) ? ?CHIEF COMPLAINT: Recurrent stage IIIa non-small cell lung cancer, right middle lobe. ? ?INTERVAL HISTORY: Patient returns to clinic today for further evaluation and consideration of his fourth and final infusion of Taxotere.  He continues to have chronic weakness and fatigue.  He has persistent shortness of breath and cough.  He continues to smoke.  He has no neurologic complaints.  He denies any fevers.  He denies any chest pain or hemoptysis. He denies any nausea, vomiting, constipation, or diarrhea. He has no urinary complaints.  Patient offers no further specific complaints today. ? ?REVIEW OF SYSTEMS:   ?Review of Systems  ?Constitutional:  Positive for malaise/fatigue. Negative for fever and weight loss.  ?HENT: Negative.  Negative for congestion and sore throat.   ?Respiratory:  Positive for cough and shortness of breath. Negative for hemoptysis.   ?Cardiovascular: Negative.  Negative for chest pain and leg swelling.  ?Gastrointestinal: Negative.  Negative for abdominal pain, blood in stool, melena and nausea.  ?Genitourinary: Negative.  Negative for dysuria.  ?Musculoskeletal: Negative.  Negative for joint pain and neck pain.  ?Skin: Negative.  Negative for rash.  ?Neurological:  Positive for weakness. Negative for dizziness, sensory change, focal weakness and headaches.  ?Psychiatric/Behavioral:  The patient is nervous/anxious.   ? ?As per HPI. Otherwise, a complete review of systems is negative. ? ?PAST MEDICAL HISTORY: ?Past Medical History:  ?Diagnosis Date  ? Anginal pain ([place])   ? Anxiety    ? Asthma   ? Chest pain   ? CHF (congestive heart failure) ([place])   ? Chicken pox   ? Complication of anesthesia   ? o2 dropped after neck fusion  ? COPD (chronic obstructive pulmonary disease) ([place])   ? Coronary artery disease   ? Cough   ? chronic  clear phlegm  ? Dysrhythmia   ? palpitations  ? GERD (gastroesophageal reflux disease)   ? h/o reflux/ hoarsness  ? Hematochezia   ? Hemorrhoids   ? History of chickenpox   ? History of colon polyps   ? History of Helicobacter pylori infection   ? Hoarseness   ? Hypertension   ? Lung cancer ([place]) 05/2016  ? Chemo + rad tx's.   ? Migraines   ? OSA (obstructive sleep apnea)   ? has CPAP but does not use  ? Personal history of tobacco use, presenting hazards to health 03/05/2016  ? Pneumonia   ? 5/17  ? Raynaud disease   ? Raynaud disease   ? Raynaud's disease   ? Rotator cuff tear   ? on right  ? Shortness of breath dyspnea   ? Sleep apnea   ? Ulcer (traumatic) of oral mucosa   ? ? ?PAST SURGICAL HISTORY: ?Past Surgical History:  ?Procedure Laterality Date  ? BACK SURGERY    ? cervical fusion x 2  ? CARDIAC CATHETERIZATION    ? CERVICAL DISCECTOMY    ? x 2  ? COLONOSCOPY    ? COLONOSCOPY N/A 07/25/2015  ? Procedure: COLONOSCOPY;  Surgeon: Lollie Sails, MD;  Location: [facility] ENDOSCOPY;  Service: Endoscopy;  Laterality: N/A;  ? COLONOSCOPY WITH PROPOFOL N/A 10/04/2017  ?  Procedure: COLONOSCOPY WITH PROPOFOL;  Surgeon: Lollie Sails, MD;  Location: [facility] ENDOSCOPY;  Service: Endoscopy;  Laterality: N/A;  ? COLONOSCOPY WITH PROPOFOL N/A 07/10/2020  ? Procedure: COLONOSCOPY WITH PROPOFOL;  Surgeon: Toledo, Benay Pike, MD;  Location: [facility] ENDOSCOPY;  Service: Gastroenterology;  Laterality: N/A;  ? ELECTROMAGNETIC NAVIGATION BROCHOSCOPY Left 06/28/2016  ? Procedure: ELECTROMAGNETIC NAVIGATION BRONCHOSCOPY;  Surgeon: Vilinda Boehringer, MD;  Location: [facility] ORS;  Service: Cardiopulmonary;  Laterality: Left;  ? ENDOBRONCHIAL ULTRASOUND N/A 04/11/2018  ? Procedure: ENDOBRONCHIAL  ULTRASOUND;  Surgeon: [name], MD;  Location: [facility] ORS;  Service: Cardiopulmonary;  Laterality: N/A;  ? ESOPHAGOGASTRODUODENOSCOPY N/A 07/25/2015  ? Procedure: ESOPHAGOGASTRODUODENOSCOPY (EGD);  Surgeon: Lollie Sails, MD;  Location: [facility] ENDOSCOPY;  Service: Endoscopy;  Laterality: N/A;  ? ESOPHAGOGASTRODUODENOSCOPY (EGD) WITH PROPOFOL N/A 07/10/2020  ? Procedure: ESOPHAGOGASTRODUODENOSCOPY (EGD) WITH PROPOFOL;  Surgeon: Toledo, Benay Pike, MD;  Location: [facility] ENDOSCOPY;  Service: Gastroenterology;  Laterality: N/A;  ? NASAL SINUS SURGERY    ? x 2   ? PORTA CATH INSERTION N/A 04/24/2018  ? Procedure: PORTA CATH INSERTION;  Surgeon: Algernon Huxley, MD;  Location: [facility] LAB;  Service: Cardiovascular;  Laterality: N/A;  ? rotator cuff surgery Right   ? 07/2016  ? SEPTOPLASTY    ? SKIN GRAFT    ? ? ?FAMILY HISTORY ?Family History  ?Problem Relation Age of Onset  ? Heart disease Father   ? Prostate cancer Father   ? Heart disease Paternal Grandmother   ? Heart attack Maternal Grandfather 52  ? Kidney cancer Neg Hx   ? Bladder Cancer Neg Hx   ? Other Neg Hx   ?     pituitary abnormality  ? ? ?  ? ADVANCED DIRECTIVES:  ? ? ?HEALTH MAINTENANCE: ?Social History  ? ?Tobacco Use  ? Smoking status: Every Day  ?  Packs/day: 3.00  ?  Years: 50.00  ?  Pack years: 150.00  ?  Types: Cigarettes  ? Smokeless tobacco: Never  ? Tobacco comments:  ?  1.5-2 PPD 11/19/2021  ?Vaping Use  ? Vaping Use: Never used  ?Substance Use Topics  ? Alcohol use: Yes  ?  Alcohol/week: 2.0 standard drinks  ?  Types: 2 Standard drinks or equivalent per week  ?  Comment: moderate  ? Drug use: No  ? ? ? ?Allergies  ?Allergen Reactions  ? Lisinopril Rash  ? Varenicline Rash  ? ? ?Current Outpatient Medications  ?Medication Sig Dispense Refill  ? acetaminophen (TYLENOL) 500 MG tablet Take 500 mg by mouth daily as needed.    ? albuterol (PROAIR HFA) 108 (90 Base) MCG/ACT inhaler Inhale 2 puffs into the lungs every 4 (four) hours as needed for  wheezing or shortness of breath. 1 Inhaler 1  ? atorvastatin (LIPITOR) 40 MG tablet Take 40 mg by mouth at bedtime.     ? chlorpheniramine-HYDROcodone 10-8 MG/5ML Take 5 mLs by mouth every 12 (twelve) hours as needed for cough. 70 mL 0  ? diazepam (VALIUM) 5 MG tablet Take 5 mg by mouth at bedtime as needed.    ? ferrous sulfate 325 (65 FE) MG tablet Take 325 mg by mouth at bedtime.    ? Fluticasone-Umeclidin-Vilant (TRELEGY ELLIPTA) 200-62.5-25 MCG/INH AEPB Inhale 200 mcg into the lungs daily. 28 each 0  ? ipratropium-albuterol (DUONEB) 0.5-2.5 (3) MG/3ML SOLN Take 3 mLs by nebulization every 6 (six) hours as needed. 360 mL 5  ? levalbuterol (XOPENEX HFA) 45 MCG/ACT inhaler Inhale 1 puff  into the lungs every 6 (six) hours as needed for wheezing. 1 each 5  ? levofloxacin (LEVAQUIN) 500 MG tablet Take 1 tablet (500 mg total) by mouth daily. 14 tablet 0  ? levothyroxine (SYNTHROID) 150 MCG tablet Take 150 mcg by mouth daily before breakfast.    ? lidocaine-prilocaine (EMLA) cream Apply 1 application topically as needed. 30 g 1  ? loratadine (CLARITIN) 10 MG tablet Take 10 mg by mouth daily.    ? magnesium oxide (MAG-OX) 400 MG tablet Take 600 mg by mouth 2 (two) times daily. Takes 1.5 tablet twice daily    ? midodrine (PROAMATINE) 5 MG tablet as needed.    ? Mouthwash Compounding Base (MOUTH WASH-GP PO) Take by mouth.    ? OXYGEN Inhale 2 L into the lungs at bedtime.    ? pantoprazole (PROTONIX) 40 MG tablet Take 40 mg by mouth 2 (two) times daily.    ? potassium chloride (KLOR-CON) 10 MEQ tablet Take 1 tablet by mouth daily.    ? predniSONE (DELTASONE) 10 MG tablet Take 4 tablets (40mg ) daily x 3 days, then take 3 tablets (30mg ) daily x 3 days, then take 2 tablets (20mg ) daily x 3 days, then take 1 tablet (10mg ) daily x 3 days then stop 30 tablet 0  ? sodium chloride 1 g tablet Take 1 tablet (1 g total) by mouth 3 (three) times daily with meals. 90 tablet 1  ? tadalafil (CIALIS) 5 MG tablet Take 1 tablet (5 mg total)  by mouth daily as needed for erectile dysfunction. 90 tablet 3  ? nitroGLYCERIN (NITROSTAT) 0.4 MG SL tablet Place under the tongue. (Patient not taking: Reported on 01/28/2022)    ? ondansetron (ZOFRAN) 8 MG table

## 2022-01-28 ENCOUNTER — Inpatient Hospital Stay: Payer: BC Managed Care – PPO | Admitting: Oncology

## 2022-01-28 ENCOUNTER — Encounter: Payer: Self-pay | Admitting: Oncology

## 2022-01-28 ENCOUNTER — Inpatient Hospital Stay: Payer: BC Managed Care – PPO

## 2022-01-28 VITALS — BP 126/82 | HR 111 | Temp 98.4°F | Resp 18 | Wt 201.2 lb

## 2022-01-28 DIAGNOSIS — C3491 Malignant neoplasm of unspecified part of right bronchus or lung: Secondary | ICD-10-CM

## 2022-01-28 DIAGNOSIS — C3411 Malignant neoplasm of upper lobe, right bronchus or lung: Secondary | ICD-10-CM | POA: Diagnosis not present

## 2022-01-28 LAB — COMPREHENSIVE METABOLIC PANEL
ALT: 21 U/L (ref 0–44)
AST: 20 U/L (ref 15–41)
Albumin: 3.1 g/dL — ABNORMAL LOW (ref 3.5–5.0)
Alkaline Phosphatase: 82 U/L (ref 38–126)
Anion gap: 7 (ref 5–15)
BUN: 11 mg/dL (ref 8–23)
CO2: 29 mmol/L (ref 22–32)
Calcium: 8.4 mg/dL — ABNORMAL LOW (ref 8.9–10.3)
Chloride: 93 mmol/L — ABNORMAL LOW (ref 98–111)
Creatinine, Ser: 0.61 mg/dL (ref 0.61–1.24)
GFR, Estimated: >60 mL/min (ref >60)
Glucose, Bld: 132 mg/dL — ABNORMAL HIGH (ref 70–99)
Potassium: 3.4 mmol/L — ABNORMAL LOW (ref 3.5–5.1)
Sodium: 129 mmol/L — ABNORMAL LOW (ref 135–145)
Total Bilirubin: 0.5 mg/dL (ref 0.3–1.2)
Total Protein: 6.3 g/dL — ABNORMAL LOW (ref 6.5–8.1)

## 2022-01-28 LAB — CBC WITH DIFFERENTIAL/PLATELET
Abs Immature Granulocytes: 0.12 10*3/uL — ABNORMAL HIGH (ref 0.00–0.07)
Basophils Absolute: 0.1 10*3/uL (ref 0.0–0.1)
Basophils Relative: 1 %
Eosinophils Absolute: 0 10*3/uL (ref 0.0–0.5)
Eosinophils Relative: 0 %
HCT: 33.5 % — ABNORMAL LOW (ref 39.0–52.0)
Hemoglobin: 10.8 g/dL — ABNORMAL LOW (ref 13.0–17.0)
Immature Granulocytes: 1 %
Lymphocytes Relative: 3 %
Lymphs Abs: 0.4 10*3/uL — ABNORMAL LOW (ref 0.7–4.0)
MCH: 30.8 pg (ref 26.0–34.0)
MCHC: 32.2 g/dL (ref 30.0–36.0)
MCV: 95.4 fL (ref 80.0–100.0)
Monocytes Absolute: 0.7 10*3/uL (ref 0.1–1.0)
Monocytes Relative: 5 %
Neutro Abs: 13.9 10*3/uL — ABNORMAL HIGH (ref 1.7–7.7)
Neutrophils Relative %: 90 %
Platelets: 254 10*3/uL (ref 150–400)
RBC: 3.51 MIL/uL — ABNORMAL LOW (ref 4.22–5.81)
RDW: 16.8 % — ABNORMAL HIGH (ref 11.5–15.5)
WBC: 15.2 10*3/uL — ABNORMAL HIGH (ref 4.0–10.5)
nRBC: 0 % (ref 0.0–0.2)

## 2022-01-28 MED ORDER — SODIUM CHLORIDE 0.9% FLUSH
10.0000 mL | INTRAVENOUS | Status: DC | PRN
  Administered 2022-01-28: 10 mL via INTRAVENOUS
  Filled 2022-01-28: qty 10

## 2022-01-28 MED ORDER — SODIUM CHLORIDE 0.9 % IV SOLN
75.0000 mg/m2 | Freq: Once | INTRAVENOUS | Status: AC
  Administered 2022-01-28: 150 mg via INTRAVENOUS
  Filled 2022-01-28: qty 15

## 2022-01-28 MED ORDER — SODIUM CHLORIDE 0.9 % IV SOLN
Freq: Once | INTRAVENOUS | Status: AC
  Filled 2022-01-28: qty 250

## 2022-01-28 MED ORDER — HEPARIN SOD (PORK) LOCK FLUSH 100 UNIT/ML IV SOLN
500.0000 [IU] | Freq: Once | INTRAVENOUS | Status: AC
  Filled 2022-01-28: qty 5

## 2022-01-28 MED ORDER — SODIUM CHLORIDE 0.9 % IV SOLN
10.0000 mg | Freq: Once | INTRAVENOUS | Status: AC
  Administered 2022-01-28: 10 mg via INTRAVENOUS
  Filled 2022-01-28: qty 10

## 2022-01-28 MED ORDER — LORAZEPAM 2 MG/ML IJ SOLN
0.5000 mg | Freq: Once | INTRAMUSCULAR | Status: AC
  Administered 2022-01-28: 0.5 mg via INTRAVENOUS
  Filled 2022-01-28: qty 1

## 2022-01-28 MED ORDER — HEPARIN SOD (PORK) LOCK FLUSH 100 UNIT/ML IV SOLN
INTRAVENOUS | Status: AC
  Administered 2022-01-28: 500 [IU] via INTRAVENOUS
  Filled 2022-01-28: qty 5

## 2022-01-28 NOTE — Patient Instructions (Signed)
[facility] CANCER CTR AT [place]  Discharge Instructions: ?Thank you for choosing [facility] to provide your oncology and hematology care.  ?If you have a lab appointment with the [facility], please go directly to the [facility] and check in at the registration area. ? ?Wear comfortable clothing and clothing appropriate for easy access to any Portacath or PICC line.  ? ?We strive to give you quality time with your provider. You may need to reschedule your appointment if you arrive late (15 or more minutes).  Arriving late affects you and other patients whose appointments are after yours.  Also, if you miss three or more appointments without notifying the office, you may be dismissed from the clinic at the provider?s discretion.    ?  ?For prescription refill requests, have your pharmacy contact our office and allow 72 hours for refills to be completed.   ? ?Today you received the following chemotherapy and/or immunotherapy agents : Taxotere  ?  ?To help prevent nausea and vomiting after your treatment, we encourage you to take your nausea medication as directed. ? ?BELOW ARE SYMPTOMS THAT SHOULD BE REPORTED IMMEDIATELY: ?*FEVER GREATER THAN 100.4 F (38 ?C) OR HIGHER ?*CHILLS OR SWEATING ?*NAUSEA AND VOMITING THAT IS NOT CONTROLLED WITH YOUR NAUSEA MEDICATION ?*UNUSUAL SHORTNESS OF BREATH ?*UNUSUAL BRUISING OR BLEEDING ?*URINARY PROBLEMS (pain or burning when urinating, or frequent urination) ?*BOWEL PROBLEMS (unusual diarrhea, constipation, pain near the anus) ?TENDERNESS IN MOUTH AND THROAT WITH OR WITHOUT PRESENCE OF ULCERS (sore throat, sores in mouth, or a toothache) ?UNUSUAL RASH, SWELLING OR PAIN  ?UNUSUAL VAGINAL DISCHARGE OR ITCHING  ? ?Items with * indicate a potential emergency and should be followed up as soon as possible or go to the Emergency Department if any problems should occur. ? ?Please show the CHEMOTHERAPY ALERT CARD or IMMUNOTHERAPY ALERT CARD at check-in to  the Emergency Department and triage nurse. ? ?Should you have questions after your visit or need to cancel or reschedule your appointment, please contact [facility] CANCER [place] AT [place]  [phone] and follow the prompts.  Office hours are 8:00 a.m. to 4:30 p.m. Monday - Friday. Please note that voicemails left after 4:00 p.m. may not be returned until the following business day.  We are closed weekends and major holidays. You have access to a nurse at all times for urgent questions. Please call the main number to the clinic [phone] and follow the prompts. ? ?For any non-urgent questions, you may also contact your provider using MyChart. We now offer e-Visits for anyone 49 and older to request care online for non-urgent symptoms. For details visit [email or web address]. ?  ?Also download the MyChart app! Go to the app store, search "MyChart", open the app, select [facility], and log in with your MyChart username and password. ? ?Due to Covid, a mask is required upon entering the hospital/clinic. If you do not have a mask, one will be given to you upon arrival. For doctor visits, patients may have 1 support person aged 26 or older with them. For treatment visits, patients cannot have anyone with them due to current Covid guidelines and our immunocompromised population.  ?

## 2022-01-28 NOTE — Progress Notes (Signed)
Nutrition Follow-up: ? ?Patient with recurrent non small cell lung cancer s/p radiation and chemotherapy. On maintenance immunotherapy.  ? ?Met with patient during infusion.  Patient reports that he is eating.  Drinking oral nutrition supplements.  Had steak, potato and salad last night for dinner.  Continues to have mouth sores following treatment (mouth wash helps).   ? ? ?Medications: reviewed ? ?Labs: reviewed ? ?Anthropometrics:  ? ?Weight 201 lb ?198 lb 9.6 oz on 3/9 ?196 lb 9.6 oz 2/16 ?192 lb on 1/26 ?189 lb on 1/3 ?195 lb on 10/15/21 ?194 lb on 08/18/21 ? ? ?NUTRITION DIAGNOSIS: Inadequate oral intake stable ? ? ? ?INTERVENTION:  ?Continue oral nutrition supplements ?Continue high calorie, high protein diet to maintain weight ? ? ?MONITORING, EVALUATION, GOAL: weight trends, intake ? ? ?NEXT VISIT: as needed ? ?Paul Carpenter, RD, LDN ?Registered Dietitian ?[phone] ? ? ?

## 2022-01-28 NOTE — Progress Notes (Signed)
Ok to tx per MD with HR 111. ?

## 2022-01-29 ENCOUNTER — Encounter: Payer: Self-pay | Admitting: Oncology

## 2022-01-29 ENCOUNTER — Inpatient Hospital Stay: Payer: BC Managed Care – PPO

## 2022-01-29 DIAGNOSIS — C3491 Malignant neoplasm of unspecified part of right bronchus or lung: Secondary | ICD-10-CM

## 2022-01-29 DIAGNOSIS — C3411 Malignant neoplasm of upper lobe, right bronchus or lung: Secondary | ICD-10-CM | POA: Diagnosis not present

## 2022-01-29 MED ORDER — PEGFILGRASTIM-CBQV 6 MG/0.6ML ~~LOC~~ SOSY
6.0000 mg | PREFILLED_SYRINGE | Freq: Once | SUBCUTANEOUS | Status: AC
  Administered 2022-01-29: 6 mg via SUBCUTANEOUS
  Filled 2022-01-29: qty 0.6

## 2022-02-03 ENCOUNTER — Inpatient Hospital Stay (HOSPITAL_BASED_OUTPATIENT_CLINIC_OR_DEPARTMENT_OTHER): Payer: BC Managed Care – PPO | Admitting: Hospice and Palliative Medicine

## 2022-02-03 ENCOUNTER — Inpatient Hospital Stay: Payer: BC Managed Care – PPO | Attending: Oncology

## 2022-02-03 ENCOUNTER — Other Ambulatory Visit: Payer: Self-pay

## 2022-02-03 ENCOUNTER — Inpatient Hospital Stay: Payer: BC Managed Care – PPO

## 2022-02-03 ENCOUNTER — Telehealth: Payer: Self-pay | Admitting: *Deleted

## 2022-02-03 ENCOUNTER — Emergency Department: Payer: BC Managed Care – PPO

## 2022-02-03 ENCOUNTER — Inpatient Hospital Stay
Admission: EM | Admit: 2022-02-03 | Discharge: 2022-02-05 | DRG: 871 | Disposition: A | Discharge status: Home / Self Care | Payer: BC Managed Care – PPO | Source: Ambulatory Visit | Attending: Internal Medicine | Admitting: Internal Medicine

## 2022-02-03 DIAGNOSIS — R0602 Shortness of breath: Secondary | ICD-10-CM | POA: Diagnosis not present

## 2022-02-03 DIAGNOSIS — J189 Pneumonia, unspecified organism: Secondary | ICD-10-CM | POA: Diagnosis present

## 2022-02-03 DIAGNOSIS — J449 Chronic obstructive pulmonary disease, unspecified: Secondary | ICD-10-CM | POA: Diagnosis present

## 2022-02-03 DIAGNOSIS — C3491 Malignant neoplasm of unspecified part of right bronchus or lung: Secondary | ICD-10-CM | POA: Diagnosis present

## 2022-02-03 DIAGNOSIS — I7300 Raynaud's syndrome without gangrene: Secondary | ICD-10-CM | POA: Diagnosis present

## 2022-02-03 DIAGNOSIS — J439 Emphysema, unspecified: Secondary | ICD-10-CM | POA: Diagnosis present

## 2022-02-03 DIAGNOSIS — I2510 Atherosclerotic heart disease of native coronary artery without angina pectoris: Secondary | ICD-10-CM | POA: Diagnosis present

## 2022-02-03 DIAGNOSIS — K219 Gastro-esophageal reflux disease without esophagitis: Secondary | ICD-10-CM | POA: Diagnosis present

## 2022-02-03 DIAGNOSIS — E039 Hypothyroidism, unspecified: Secondary | ICD-10-CM | POA: Diagnosis present

## 2022-02-03 DIAGNOSIS — D509 Iron deficiency anemia, unspecified: Secondary | ICD-10-CM | POA: Insufficient documentation

## 2022-02-03 DIAGNOSIS — I509 Heart failure, unspecified: Secondary | ICD-10-CM | POA: Diagnosis not present

## 2022-02-03 DIAGNOSIS — J9621 Acute and chronic respiratory failure with hypoxia: Secondary | ICD-10-CM | POA: Diagnosis present

## 2022-02-03 DIAGNOSIS — E871 Hypo-osmolality and hyponatremia: Secondary | ICD-10-CM

## 2022-02-03 DIAGNOSIS — Z981 Arthrodesis status: Secondary | ICD-10-CM

## 2022-02-03 DIAGNOSIS — I110 Hypertensive heart disease with heart failure: Secondary | ICD-10-CM | POA: Diagnosis not present

## 2022-02-03 DIAGNOSIS — Z923 Personal history of irradiation: Secondary | ICD-10-CM | POA: Insufficient documentation

## 2022-02-03 DIAGNOSIS — F419 Anxiety disorder, unspecified: Secondary | ICD-10-CM | POA: Diagnosis present

## 2022-02-03 DIAGNOSIS — A419 Sepsis, unspecified organism: Principal | ICD-10-CM | POA: Diagnosis present

## 2022-02-03 DIAGNOSIS — Z20822 Contact with and (suspected) exposure to covid-19: Secondary | ICD-10-CM | POA: Diagnosis present

## 2022-02-03 DIAGNOSIS — R0682 Tachypnea, not elsewhere classified: Secondary | ICD-10-CM | POA: Insufficient documentation

## 2022-02-03 DIAGNOSIS — Z79899 Other long term (current) drug therapy: Secondary | ICD-10-CM | POA: Insufficient documentation

## 2022-02-03 DIAGNOSIS — C342 Malignant neoplasm of middle lobe, bronchus or lung: Secondary | ICD-10-CM | POA: Diagnosis present

## 2022-02-03 DIAGNOSIS — Z888 Allergy status to other drugs, medicaments and biological substances status: Secondary | ICD-10-CM

## 2022-02-03 DIAGNOSIS — I5022 Chronic systolic (congestive) heart failure: Secondary | ICD-10-CM | POA: Diagnosis present

## 2022-02-03 DIAGNOSIS — Z7951 Long term (current) use of inhaled steroids: Secondary | ICD-10-CM

## 2022-02-03 DIAGNOSIS — R197 Diarrhea, unspecified: Secondary | ICD-10-CM | POA: Diagnosis present

## 2022-02-03 DIAGNOSIS — E222 Syndrome of inappropriate secretion of antidiuretic hormone: Secondary | ICD-10-CM | POA: Diagnosis present

## 2022-02-03 DIAGNOSIS — R0789 Other chest pain: Secondary | ICD-10-CM | POA: Diagnosis present

## 2022-02-03 DIAGNOSIS — Z7952 Long term (current) use of systemic steroids: Secondary | ICD-10-CM | POA: Diagnosis not present

## 2022-02-03 DIAGNOSIS — Z79890 Hormone replacement therapy (postmenopausal): Secondary | ICD-10-CM

## 2022-02-03 DIAGNOSIS — G4733 Obstructive sleep apnea (adult) (pediatric): Secondary | ICD-10-CM | POA: Diagnosis present

## 2022-02-03 DIAGNOSIS — F17210 Nicotine dependence, cigarettes, uncomplicated: Secondary | ICD-10-CM | POA: Diagnosis present

## 2022-02-03 DIAGNOSIS — Z8042 Family history of malignant neoplasm of prostate: Secondary | ICD-10-CM

## 2022-02-03 DIAGNOSIS — M5412 Radiculopathy, cervical region: Secondary | ICD-10-CM | POA: Diagnosis present

## 2022-02-03 DIAGNOSIS — Y95 Nosocomial condition: Secondary | ICD-10-CM | POA: Diagnosis present

## 2022-02-03 DIAGNOSIS — Z8249 Family history of ischemic heart disease and other diseases of the circulatory system: Secondary | ICD-10-CM

## 2022-02-03 DIAGNOSIS — E782 Mixed hyperlipidemia: Secondary | ICD-10-CM | POA: Diagnosis present

## 2022-02-03 DIAGNOSIS — I10 Essential (primary) hypertension: Secondary | ICD-10-CM | POA: Diagnosis present

## 2022-02-03 DIAGNOSIS — Z9981 Dependence on supplemental oxygen: Secondary | ICD-10-CM

## 2022-02-03 LAB — COMPREHENSIVE METABOLIC PANEL
ALT: 21 U/L (ref 0–44)
AST: 25 U/L (ref 15–41)
Albumin: 3.1 g/dL — ABNORMAL LOW (ref 3.5–5.0)
Alkaline Phosphatase: 95 U/L (ref 38–126)
Anion gap: 8 (ref 5–15)
BUN: 13 mg/dL (ref 8–23)
CO2: 28 mmol/L (ref 22–32)
Calcium: 8.5 mg/dL — ABNORMAL LOW (ref 8.9–10.3)
Chloride: 87 mmol/L — ABNORMAL LOW (ref 98–111)
Creatinine, Ser: 0.67 mg/dL (ref 0.61–1.24)
GFR, Estimated: >60 mL/min (ref >60)
Glucose, Bld: 119 mg/dL — ABNORMAL HIGH (ref 70–99)
Potassium: 4.1 mmol/L (ref 3.5–5.1)
Sodium: 123 mmol/L — ABNORMAL LOW (ref 135–145)
Total Bilirubin: 0.7 mg/dL (ref 0.3–1.2)
Total Protein: 6.3 g/dL — ABNORMAL LOW (ref 6.5–8.1)

## 2022-02-03 LAB — CBC WITH DIFFERENTIAL/PLATELET
Abs Immature Granulocytes: 0.18 10*3/uL — ABNORMAL HIGH (ref 0.00–0.07)
Basophils Absolute: 0 10*3/uL (ref 0.0–0.1)
Basophils Relative: 0 %
Eosinophils Absolute: 0.2 10*3/uL (ref 0.0–0.5)
Eosinophils Relative: 2 %
HCT: 31.8 % — ABNORMAL LOW (ref 39.0–52.0)
Hemoglobin: 10.5 g/dL — ABNORMAL LOW (ref 13.0–17.0)
Immature Granulocytes: 2 %
Lymphocytes Relative: 3 %
Lymphs Abs: 0.3 10*3/uL — ABNORMAL LOW (ref 0.7–4.0)
MCH: 31.2 pg (ref 26.0–34.0)
MCHC: 33 g/dL (ref 30.0–36.0)
MCV: 94.4 fL (ref 80.0–100.0)
Monocytes Absolute: 0.7 10*3/uL (ref 0.1–1.0)
Monocytes Relative: 7 %
Neutro Abs: 7.6 10*3/uL (ref 1.7–7.7)
Neutrophils Relative %: 86 %
Platelets: 300 10*3/uL (ref 150–400)
RBC: 3.37 MIL/uL — ABNORMAL LOW (ref 4.22–5.81)
RDW: 16 % — ABNORMAL HIGH (ref 11.5–15.5)
Smear Review: NORMAL
WBC: 8.9 10*3/uL (ref 4.0–10.5)
nRBC: 0 % (ref 0.0–0.2)

## 2022-02-03 LAB — URINALYSIS, ROUTINE W REFLEX MICROSCOPIC
Bilirubin Urine: NEGATIVE
Glucose, UA: NEGATIVE mg/dL
Hgb urine dipstick: NEGATIVE
Ketones, ur: NEGATIVE mg/dL
Leukocytes,Ua: NEGATIVE
Nitrite: NEGATIVE
Protein, ur: NEGATIVE mg/dL
Specific Gravity, Urine: 1.03 (ref 1.005–1.030)
pH: 7 (ref 5.0–8.0)

## 2022-02-03 LAB — PROCALCITONIN: Procalcitonin: 0.21 ng/mL

## 2022-02-03 LAB — GROUP A STREP BY PCR: Group A Strep by PCR: NOT DETECTED

## 2022-02-03 LAB — RESP PANEL BY RT-PCR (FLU A&B, COVID) ARPGX2
Influenza A by PCR: NEGATIVE
Influenza B by PCR: NEGATIVE
SARS Coronavirus 2 by RT PCR: NEGATIVE

## 2022-02-03 LAB — PROTIME-INR
INR: 1 (ref 0.8–1.2)
Prothrombin Time: 13.2 seconds (ref 11.4–15.2)

## 2022-02-03 LAB — LACTIC ACID, PLASMA: Lactic Acid, Venous: 1.3 mmol/L (ref 0.5–1.9)

## 2022-02-03 MED ORDER — SODIUM CHLORIDE 0.9 % IV SOLN
2.0000 g | Freq: Three times a day (TID) | INTRAVENOUS | Status: DC
  Administered 2022-02-04 – 2022-02-05 (×5): 2 g via INTRAVENOUS
  Filled 2022-02-03 (×4): qty 2
  Filled 2022-02-03: qty 12.5
  Filled 2022-02-03: qty 2

## 2022-02-03 MED ORDER — LIDOCAINE 5 % EX PTCH
1.0000 | MEDICATED_PATCH | CUTANEOUS | Status: DC
  Administered 2022-02-03 – 2022-02-04 (×2): 1 via TRANSDERMAL
  Filled 2022-02-03 (×2): qty 1

## 2022-02-03 MED ORDER — HYDROCOD POLI-CHLORPHE POLI ER 10-8 MG/5ML PO SUER
5.0000 mL | Freq: Two times a day (BID) | ORAL | Status: DC | PRN
  Administered 2022-02-04 (×2): 5 mL via ORAL
  Filled 2022-02-03 (×3): qty 5

## 2022-02-03 MED ORDER — SODIUM CHLORIDE 1 G PO TABS
1.0000 g | ORAL_TABLET | Freq: Three times a day (TID) | ORAL | Status: DC
  Administered 2022-02-04 – 2022-02-05 (×5): 1 g via ORAL
  Filled 2022-02-03 (×5): qty 1

## 2022-02-03 MED ORDER — KETOROLAC TROMETHAMINE 15 MG/ML IJ SOLN
15.0000 mg | Freq: Four times a day (QID) | INTRAMUSCULAR | Status: AC | PRN
Start: 2022-02-03 — End: 2022-02-04
  Administered 2022-02-03 – 2022-02-04 (×3): 15 mg via INTRAVENOUS
  Filled 2022-02-03 (×3): qty 1

## 2022-02-03 MED ORDER — SODIUM CHLORIDE 0.9% FLUSH
10.0000 mL | Freq: Once | INTRAVENOUS | Status: AC
  Administered 2022-02-03: 10 mL via INTRAVENOUS
  Filled 2022-02-03: qty 10

## 2022-02-03 MED ORDER — VANCOMYCIN HCL 1500 MG/300ML IV SOLN
1500.0000 mg | Freq: Two times a day (BID) | INTRAVENOUS | Status: DC
  Administered 2022-02-04: 1500 mg via INTRAVENOUS
  Filled 2022-02-03 (×2): qty 300

## 2022-02-03 MED ORDER — BUDESONIDE 0.5 MG/2ML IN SUSP: 0.5000 mg | Freq: Three times a day (TID) | RESPIRATORY_TRACT | Status: DC | PRN

## 2022-02-03 MED ORDER — UMECLIDINIUM BROMIDE 62.5 MCG/ACT IN AEPB
1.0000 | INHALATION_SPRAY | Freq: Every day | RESPIRATORY_TRACT | Status: DC
  Administered 2022-02-04 – 2022-02-05 (×2): 1 via RESPIRATORY_TRACT
  Filled 2022-02-03: qty 7

## 2022-02-03 MED ORDER — FLUTICASONE FUROATE-VILANTEROL 200-25 MCG/ACT IN AEPB
1.0000 | INHALATION_SPRAY | Freq: Every day | RESPIRATORY_TRACT | Status: DC
Start: 2022-02-04 — End: 2022-02-05
  Administered 2022-02-04 – 2022-02-05 (×2): 1 via RESPIRATORY_TRACT
  Filled 2022-02-03: qty 28

## 2022-02-03 MED ORDER — ENOXAPARIN SODIUM 40 MG/0.4ML IJ SOSY
40.0000 mg | PREFILLED_SYRINGE | INTRAMUSCULAR | Status: DC
  Administered 2022-02-03 – 2022-02-04 (×2): 40 mg via SUBCUTANEOUS
  Filled 2022-02-03 (×2): qty 0.4

## 2022-02-03 MED ORDER — DIAZEPAM 5 MG PO TABS
5.0000 mg | ORAL_TABLET | Freq: Every evening | ORAL | Status: DC | PRN
  Administered 2022-02-03 – 2022-02-04 (×2): 5 mg via ORAL
  Filled 2022-02-03 (×2): qty 1

## 2022-02-03 MED ORDER — ONDANSETRON HCL 4 MG PO TABS: 4.0000 mg | ORAL_TABLET | Freq: Four times a day (QID) | ORAL | Status: DC | PRN

## 2022-02-03 MED ORDER — SODIUM CHLORIDE 0.9 % IV SOLN: INTRAVENOUS | Status: AC

## 2022-02-03 MED ORDER — HYDRALAZINE HCL 10 MG PO TABS
10.0000 mg | ORAL_TABLET | Freq: Four times a day (QID) | ORAL | Status: DC | PRN
Start: 2022-02-03 — End: 2022-02-05
  Filled 2022-02-03: qty 1

## 2022-02-03 MED ORDER — VANCOMYCIN HCL IN DEXTROSE 1-5 GM/200ML-% IV SOLN
1000.0000 mg | Freq: Once | INTRAVENOUS | Status: AC
  Administered 2022-02-03: 1000 mg via INTRAVENOUS
  Filled 2022-02-03: qty 200

## 2022-02-03 MED ORDER — HYDRALAZINE HCL 10 MG PO TABS: 10.0000 mg | ORAL_TABLET | Freq: Four times a day (QID) | ORAL | Status: DC | PRN

## 2022-02-03 MED ORDER — SODIUM CHLORIDE 0.9 % IV SOLN
500.0000 mg | INTRAVENOUS | Status: DC
  Administered 2022-02-03 – 2022-02-04 (×2): 500 mg via INTRAVENOUS
  Filled 2022-02-03: qty 5
  Filled 2022-02-03 (×2): qty 500

## 2022-02-03 MED ORDER — SODIUM CHLORIDE 0.9 % IV BOLUS (SEPSIS)
1000.0000 mL | Freq: Once | INTRAVENOUS | Status: AC
  Administered 2022-02-03: 1000 mL via INTRAVENOUS

## 2022-02-03 MED ORDER — POLYETHYLENE GLYCOL 3350 17 G PO PACK: 17.0000 g | PACK | Freq: Every day | ORAL | Status: DC | PRN

## 2022-02-03 MED ORDER — SODIUM CHLORIDE 0.9 % IV SOLN
2.0000 g | Freq: Once | INTRAVENOUS | Status: AC
  Administered 2022-02-03: 2 g via INTRAVENOUS
  Filled 2022-02-03: qty 12.5

## 2022-02-03 MED ORDER — ATORVASTATIN CALCIUM 20 MG PO TABS
40.0000 mg | ORAL_TABLET | Freq: Every day | ORAL | Status: DC
  Administered 2022-02-03 – 2022-02-04 (×2): 40 mg via ORAL
  Filled 2022-02-03 (×2): qty 2

## 2022-02-03 MED ORDER — NITROGLYCERIN 0.4 MG SL SUBL: 0.4000 mg | SUBLINGUAL_TABLET | SUBLINGUAL | Status: DC | PRN

## 2022-02-03 MED ORDER — ACETAMINOPHEN 325 MG PO TABS
650.0000 mg | ORAL_TABLET | Freq: Four times a day (QID) | ORAL | Status: DC | PRN
  Administered 2022-02-04: 650 mg via ORAL
  Filled 2022-02-03: qty 2

## 2022-02-03 MED ORDER — ALBUTEROL SULFATE (2.5 MG/3ML) 0.083% IN NEBU: 3.0000 mL | INHALATION_SOLUTION | RESPIRATORY_TRACT | Status: DC | PRN

## 2022-02-03 MED ORDER — IOHEXOL 350 MG/ML SOLN
75.0000 mL | Freq: Once | INTRAVENOUS | Status: AC | PRN
Start: 2022-02-03 — End: 2022-02-03
  Administered 2022-02-03: 75 mL via INTRAVENOUS

## 2022-02-03 MED ORDER — ONDANSETRON HCL 4 MG/2ML IJ SOLN: 4.0000 mg | Freq: Four times a day (QID) | INTRAMUSCULAR | Status: DC | PRN

## 2022-02-03 MED ORDER — HEPARIN SOD (PORK) LOCK FLUSH 100 UNIT/ML IV SOLN
500.0000 [IU] | Freq: Once | INTRAVENOUS | Status: DC
  Filled 2022-02-03: qty 5

## 2022-02-03 MED ORDER — METOPROLOL TARTRATE 5 MG/5ML IV SOLN: 5.0000 mg | INTRAVENOUS | Status: DC | PRN

## 2022-02-03 MED ORDER — VANCOMYCIN HCL IN DEXTROSE 1-5 GM/200ML-% IV SOLN
1000.0000 mg | Freq: Once | INTRAVENOUS | Status: AC
  Administered 2022-02-04: 1000 mg via INTRAVENOUS
  Filled 2022-02-03: qty 200

## 2022-02-03 MED ORDER — LEVALBUTEROL TARTRATE 45 MCG/ACT IN AERO: 1.0000 | INHALATION_SPRAY | Freq: Four times a day (QID) | RESPIRATORY_TRACT | Status: DC | PRN

## 2022-02-03 MED ORDER — NICOTINE 21 MG/24HR TD PT24
21.0000 mg | MEDICATED_PATCH | Freq: Every day | TRANSDERMAL | Status: DC | PRN
  Administered 2022-02-03 – 2022-02-05 (×2): 21 mg via TRANSDERMAL
  Filled 2022-02-03 (×2): qty 1

## 2022-02-03 MED ORDER — ACETAMINOPHEN 325 MG PO TABS
650.0000 mg | ORAL_TABLET | Freq: Once | ORAL | Status: AC
  Administered 2022-02-03: 650 mg via ORAL
  Filled 2022-02-03: qty 2

## 2022-02-03 MED ORDER — LEVOTHYROXINE SODIUM 50 MCG PO TABS
150.0000 ug | ORAL_TABLET | Freq: Every day | ORAL | Status: DC
  Administered 2022-02-04 – 2022-02-05 (×2): 150 ug via ORAL
  Filled 2022-02-03 (×2): qty 3

## 2022-02-03 MED ORDER — ACETAMINOPHEN 650 MG RE SUPP
650.0000 mg | Freq: Four times a day (QID) | RECTAL | Status: DC | PRN
Start: 2022-02-03 — End: 2022-02-05

## 2022-02-03 MED ORDER — LACTATED RINGERS IV BOLUS
1000.0000 mL | Freq: Once | INTRAVENOUS | Status: AC
Start: 2022-02-03 — End: 2022-02-03
  Administered 2022-02-03: 1000 mL via INTRAVENOUS

## 2022-02-03 MED ORDER — PANTOPRAZOLE SODIUM 40 MG PO TBEC
40.0000 mg | DELAYED_RELEASE_TABLET | Freq: Two times a day (BID) | ORAL | Status: DC
  Administered 2022-02-03 – 2022-02-05 (×4): 40 mg via ORAL
  Filled 2022-02-03 (×4): qty 1

## 2022-02-03 MED ORDER — MIDODRINE HCL 5 MG PO TABS: 5.0000 mg | ORAL_TABLET | Freq: Three times a day (TID) | ORAL | Status: DC | PRN

## 2022-02-03 NOTE — Assessment & Plan Note (Addendum)
Blood pressure mildly elevated.  Patient was on midodrine. ?-Hold midodrine ?- Hydralazine 100 mg p.o. every 6 hours as needed for SBP greater than 170,  ?

## 2022-02-03 NOTE — ED Triage Notes (Signed)
First RN note: ? ?Pt comes into the ED via cancer center c/o SHOB, tachypnea, low grade fever.  Cancer center NP believes the patient may have recurrent pneumonia.  Blood work completed in the cancer center today and patient comes in with [facility] accessed.  ?

## 2022-02-03 NOTE — Assessment & Plan Note (Addendum)
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

## 2022-02-03 NOTE — ED Triage Notes (Signed)
Pt started feeling more shob Saturday. Most recent treatment was Thursday.  ?

## 2022-02-03 NOTE — ED Notes (Addendum)
Pt placed on 2L [place] at this time. O2 in high 80s and low 90s. Pt's WOB increased. WOB decreased after placing pt on [place]  ?

## 2022-02-03 NOTE — Progress Notes (Signed)
? ?[facility] ?[place] [facility] ?[phone] [phone] ? ?Patient Care Team: ?[name], MD as PCP - General (Internal Medicine) ?Algernon Huxley, MD as Referring Physician (Vascular Surgery) ?[name], MD as Consulting Physician (Oncology) ?[name], MD as Referring Physician (Radiation Oncology)  ? ?Name of the patient: Paul Carpenter  ?[id number]  ?[birth year]  ? ?Date of visit: 02/03/22 ? ?Reason for Consult: ?Paul Carpenter is a 66 y.o. male with multiple medical problems including recurrent stage IIIa non-small cell lung cancer status post previous XRT and chemotherapy and maintenance immunotherapy.  ? ?PMH is also notable for iron deficiency anemia, chronic hyponatremia, hypothyroidism, COPD, CHF with EF of 40%, and recent C2 cervical fracture after a fall on 10/28/2021. ? ?Patient was hospitalized in November 2022 and again in January 2023 for hyponatremia secondary to SIADH. ? ?Patient presents to [facility] today for evaluation of progressive shortness of breath, productive cough, fatigue/weakness.  Patient reports that symptoms have been worsening over the past week.  He noted some diarrhea/loose stools today.  Upon arrival to the clinic, he was noted to be febrile with temp of 102.4, tachycardic with heart rate in 120s, and tachypneic with respiratory rate of 22.  Patient reports that he has noticed his SaO2 to be in mid 80s at home.  Wife states that patient had negative home COVID test. ? ?Of note, patient was treated in early March with Levaquin for multifocal pneumonia. ? ?Denies any neurologic complaints. Denies any easy bleeding or bruising. Reports fair appetite. Denies chest pain. Denies any nausea, vomiting, constipation. Denies urinary complaints. Patient offers no further specific complaints today. ? ?PAST MEDICAL HISTORY: ?Past Medical History:  ?Diagnosis Date  ? Anginal pain ([place])   ? Anxiety   ? Asthma   ?  Chest pain   ? CHF (congestive heart failure) ([place])   ? Chicken pox   ? Complication of anesthesia   ? o2 dropped after neck fusion  ? COPD (chronic obstructive pulmonary disease) ([place])   ? Coronary artery disease   ? Cough   ? chronic  clear phlegm  ? Dysrhythmia   ? palpitations  ? GERD (gastroesophageal reflux disease)   ? h/o reflux/ hoarsness  ? Hematochezia   ? Hemorrhoids   ? History of chickenpox   ? History of colon polyps   ? History of Helicobacter pylori infection   ? Hoarseness   ? Hypertension   ? Lung cancer ([place]) 05/2016  ? Chemo + rad tx's.   ? Migraines   ? OSA (obstructive sleep apnea)   ? has CPAP but does not use  ? Personal history of tobacco use, presenting hazards to health 03/05/2016  ? Pneumonia   ? 5/17  ? Raynaud disease   ? Raynaud disease   ? Raynaud's disease   ? Rotator cuff tear   ? on right  ? Shortness of breath dyspnea   ? Sleep apnea   ? Ulcer (traumatic) of oral mucosa   ? ? ?PAST SURGICAL HISTORY:  ?Past Surgical History:  ?Procedure Laterality Date  ? BACK SURGERY    ? cervical fusion x 2  ? CARDIAC CATHETERIZATION    ? CERVICAL DISCECTOMY    ? x 2  ? COLONOSCOPY    ? COLONOSCOPY N/A 07/25/2015  ? Procedure: COLONOSCOPY;  Surgeon: Lollie Sails, MD;  Location: [facility] ENDOSCOPY;  Service: Endoscopy;  Laterality: N/A;  ? COLONOSCOPY WITH PROPOFOL N/A 10/04/2017  ?  Procedure: COLONOSCOPY WITH PROPOFOL;  Surgeon: Lollie Sails, MD;  Location: [facility] ENDOSCOPY;  Service: Endoscopy;  Laterality: N/A;  ? COLONOSCOPY WITH PROPOFOL N/A 07/10/2020  ? Procedure: COLONOSCOPY WITH PROPOFOL;  Surgeon: Toledo, Benay Pike, MD;  Location: [facility] ENDOSCOPY;  Service: Gastroenterology;  Laterality: N/A;  ? ELECTROMAGNETIC NAVIGATION BROCHOSCOPY Left 06/28/2016  ? Procedure: ELECTROMAGNETIC NAVIGATION BRONCHOSCOPY;  Surgeon: Vilinda Boehringer, MD;  Location: [facility] ORS;  Service: Cardiopulmonary;  Laterality: Left;  ? ENDOBRONCHIAL ULTRASOUND N/A 04/11/2018  ? Procedure: ENDOBRONCHIAL ULTRASOUND;  Surgeon:  [name], MD;  Location: [facility] ORS;  Service: Cardiopulmonary;  Laterality: N/A;  ? ESOPHAGOGASTRODUODENOSCOPY N/A 07/25/2015  ? Procedure: ESOPHAGOGASTRODUODENOSCOPY (EGD);  Surgeon: Lollie Sails, MD;  Location: [facility] ENDOSCOPY;  Service: Endoscopy;  Laterality: N/A;  ? ESOPHAGOGASTRODUODENOSCOPY (EGD) WITH PROPOFOL N/A 07/10/2020  ? Procedure: ESOPHAGOGASTRODUODENOSCOPY (EGD) WITH PROPOFOL;  Surgeon: Toledo, Benay Pike, MD;  Location: [facility] ENDOSCOPY;  Service: Gastroenterology;  Laterality: N/A;  ? NASAL SINUS SURGERY    ? x 2   ? PORTA CATH INSERTION N/A 04/24/2018  ? Procedure: PORTA CATH INSERTION;  Surgeon: Algernon Huxley, MD;  Location: [place] CV LAB;  Service: Cardiovascular;  Laterality: N/A;  ? rotator cuff surgery Right   ? 07/2016  ? SEPTOPLASTY    ? SKIN GRAFT    ? ? ?HEMATOLOGY/ONCOLOGY HISTORY:  ?Oncology History  ?Non-small cell lung cancer, right ([place])  ?04/24/2018 Initial Diagnosis  ? Non-small cell lung cancer, right [facility]) ?  ?04/28/2018 Cancer Staging  ? Staging form: Lung, AJCC 8th Edition ?- Clinical stage from 04/28/2018: Stage IIIA (cT1b, cN2, cM0) - Signed by [name], MD on 04/28/2018 ?  ?05/03/2018 - 06/21/2018 Chemotherapy  ? The patient had palonosetron (ALOXI) injection 0.25 mg, 0.25 mg, Intravenous,  Once, 8 of 8 cycles ?Administration: 0.25 mg (05/03/2018), 0.25 mg (05/11/2018), 0.25 mg (06/07/2018), 0.25 mg (06/14/2018), 0.25 mg (06/21/2018), 0.25 mg (05/17/2018), 0.25 mg (05/24/2018), 0.25 mg (05/31/2018) ?CARBOplatin (PARAPLATIN) 300 mg in sodium chloride 0.9 % 100 mL chemo infusion, 300 mg (100 % of original dose 299.4 mg), Intravenous,  Once, 8 of 8 cycles ?Dose modification:   (original dose 299.4 mg, Cycle 1) ?Administration: 300 mg (05/11/2018), 300 mg (06/07/2018), 300 mg (06/14/2018), 300 mg (06/21/2018), 300 mg (05/17/2018), 300 mg (05/24/2018), 300 mg (05/31/2018) ?PACLitaxel (TAXOL) 96 mg in sodium chloride 0.9 % 250 mL chemo infusion (</= 80mg /m2), 45 mg/m2 = 96 mg,  Intravenous,  Once, 1 of 1 cycle ?Administration: 96 mg (05/03/2018) ? for chemotherapy treatment.  ?  ?07/12/2018 - 06/27/2019 Chemotherapy  ? Patient is on Treatment Plan : LUNG DURVALUMAB [id number]  ?   ?11/26/2021 -  Chemotherapy  ? Patient is on Treatment Plan : LUNG Docetaxel q21d  ?   ? ? ?ALLERGIES:  is allergic to lisinopril and varenicline. ? ?MEDICATIONS:  ?No current facility-administered medications for this visit.  ? ?Current Outpatient Medications  ?Medication Sig Dispense Refill  ? acetaminophen (TYLENOL) 500 MG tablet Take 500 mg by mouth daily as needed.    ? albuterol (PROAIR HFA) 108 (90 Base) MCG/ACT inhaler Inhale 2 puffs into the lungs every 4 (four) hours as needed for wheezing or shortness of breath. 1 Inhaler 1  ? atorvastatin (LIPITOR) 40 MG tablet Take 40 mg by mouth at bedtime.     ? chlorpheniramine-HYDROcodone 10-8 MG/5ML Take 5 mLs by mouth every 12 (twelve) hours as needed for cough. 70 mL 0  ? diazepam (VALIUM) 5 MG tablet Take 5 mg by mouth  at bedtime as needed.    ? ferrous sulfate 325 (65 FE) MG tablet Take 325 mg by mouth at bedtime.    ? Fluticasone-Umeclidin-Vilant (TRELEGY ELLIPTA) 200-62.5-25 MCG/INH AEPB Inhale 200 mcg into the lungs daily. 28 each 0  ? ipratropium-albuterol (DUONEB) 0.5-2.5 (3) MG/3ML SOLN Take 3 mLs by nebulization every 6 (six) hours as needed. 360 mL 5  ? levalbuterol (XOPENEX HFA) 45 MCG/ACT inhaler Inhale 1 puff into the lungs every 6 (six) hours as needed for wheezing. 1 each 5  ? levofloxacin (LEVAQUIN) 500 MG tablet Take 1 tablet (500 mg total) by mouth daily. 14 tablet 0  ? levothyroxine (SYNTHROID) 150 MCG tablet Take 150 mcg by mouth daily before breakfast.    ? lidocaine-prilocaine (EMLA) cream Apply 1 application topically as needed. 30 g 1  ? loratadine (CLARITIN) 10 MG tablet Take 10 mg by mouth daily.    ? magnesium oxide (MAG-OX) 400 MG tablet Take 600 mg by mouth 2 (two) times daily. Takes 1.5 tablet twice daily    ? midodrine (PROAMATINE) 5 MG  tablet as needed.    ? Mouthwash Compounding Base (MOUTH WASH-GP PO) Take by mouth.    ? nitroGLYCERIN (NITROSTAT) 0.4 MG SL tablet Place under the tongue. (Patient not taking: Reported on 01/28/2022)    ? ondansetron

## 2022-02-03 NOTE — Telephone Encounter (Addendum)
Wife called reporting that patient is not doing well since chemotherapy treatment, he is weak, mouth is raw and he is lethargic, She feels he needs to at least have his labs checked to see if his sodium has dropped again. Please advise ?

## 2022-02-03 NOTE — ED Notes (Addendum)
Called lab at this time to inquire about the delay for the lactic acid, lab tech stating they should result in a couple mins that there was an issues with the instruments.  ?

## 2022-02-03 NOTE — Progress Notes (Signed)
Pt transported to ED; accompanied by Billey Chang, NP. Report given to triage RN. Port remains accessed.  ?

## 2022-02-03 NOTE — ED Notes (Signed)
Pt flushed urine sample down the toilet, instructed next time to save it for a urine sample.  ?

## 2022-02-03 NOTE — Assessment & Plan Note (Signed)
-   Nicotine patch as needed for nicotine craving ordered ?

## 2022-02-03 NOTE — ED Provider Notes (Signed)
? ?[facility] ?Provider Note ? ? ? Event Date/Time  ? First MD Initiated Contact with Patient 02/03/22 1642   ?  (approximate) ? ? ?History  ? ?Shortness of Breath ? ? ?HPI ? ?Paul Carpenter is a 66 y.o. male  who, per oncology note from earlier today has history of lung cancer, anemia, chronic hyponatremia, COPD, CHF who presents to the emergency department today because of concern for increasing shortness of breath, fevers.  The patient does have history of cancer.  Is currently undergoing treatment.  States that he has recently had to be started on oxygen typically 2 L at home.  However recently he has been feeling more short of breath.  When he went to cancer center today he was found to have a fever.  He has had chills over the past 2 days.  The patient additionally has complaints of mouth sores and loss of taste.  States he took a home COVID test which was negative.  Additionally the patient is having right sided thoracic pain.  No rash. ? ?Physical Exam  ? ?Triage Vital Signs: ?ED Triage Vitals  ?Enc Vitals Group  ?   BP 02/03/22 1554 136/69  ?   Pulse Rate 02/03/22 1554 (!) 119  ?   Resp 02/03/22 1554 (!) 25  ?   Temp 02/03/22 1554 (!) 101.5 ?F (38.6 ?C)  ?   Temp Source 02/03/22 1554 Oral  ?   SpO2 02/03/22 1554 92 %  ?   Weight --   ?   Height --   ?   Head Circumference --   ?   Peak Flow --   ?   Pain Score 02/03/22 1556 7  ? ?Most recent vital signs: ?Vitals:  ? 02/03/22 1554 02/03/22 1630  ?BP: 136/69 130/78  ?Pulse: (!) 119 (!) 110  ?Resp: (!) 25 (!) 21  ?Temp: (!) 101.5 ?F (38.6 ?C)   ?SpO2: 92% 98%  ? ? ?General: Awake, no distress.  ?CV:  Good peripheral perfusion. Tachycardia. ?Resp:  Tachypnea. Wheezing and crackles in lungs.  ?Abd:  No distention.  ? ? ?ED Results / Procedures / Treatments  ? ?Labs ?(all labs ordered are listed, but only abnormal results are displayed) ?Labs Reviewed  ?CULTURE, BLOOD (ROUTINE X 2)  ?CULTURE, BLOOD (ROUTINE X 2)  ?LACTIC ACID, PLASMA   ?LACTIC ACID, PLASMA  ?PROTIME-INR  ?URINALYSIS, ROUTINE W REFLEX MICROSCOPIC  ? ? ? ?EKG ? ?INance Pear, attending physician, personally viewed and interpreted this EKG ? ?EKG Time: 1606 ?Rate: 117 ?Rhythm: sinus tachycardia ?Axis: normal ?Intervals: qtc 450 ?QRS: left posterior fascicular block ?ST changes: no st elevation ?Impression: abnormal ekg ? ? ? ?RADIOLOGY ?I independently interpreted and visualized the CXR. My interpretation: patchy airspace disease ?Radiology interpretation:  ?IMPRESSION:  ?Persistent irregular pulmonary opacity within the right upper lobe,  ?corresponding with the cavitary process described on the prior  ?PET-CT of 11/16/2021 (provided differential considerations at that  ?time included chronic atypical infection and/or neoplasm).  ?   ?Ill-defined and nodular pulmonary opacities elsewhere within the  ?right lung and within the left mid to lower lung field are similar  ?to the prior chest radiographs of 01/22/2022. These foci are  ?suspected to at least partially reflect an infectious/inflammatory  ?process. Correlate with findings on the prior PET-CT.  ?   ?Aortic Atherosclerosis (ICD10-I70.0).  ?   ? ?CT angio PE ?IMPRESSION:  ?1. Progressive multi cavitary lesion occupying a good portion of  the  ?right upper lobe, highly suspicious for progressive neoplasm.  ?2. Enlarging right infrahilar and mediastinal lymph nodes.  ?3. Several small solid pulmonary nodules highly suspicious for  ?metastatic disease.  ?4. Patchy nodular airspace disease with surrounding tree-in-bud  ?appearance is most likely a superimposed atypical infectious process  ?such as MAC.  ?The soft tissue nodularity in the right middle lobe and right lower  ?lobe has progressed and this could be progressive infection or  ?progressive tumor.  ?5. Severe underlying emphysematous changes.  ?6. Stable atherosclerotic calcifications involving the thoracic  ?aorta and branch vessels including the coronary arteries.   ?   ? ? ? ?PROCEDURES: ? ?Critical Care performed: No ? ?Procedures ? ? ?MEDICATIONS ORDERED IN ED: ?Medications - No data to display ? ? ?IMPRESSION / MDM / ASSESSMENT AND PLAN / ED COURSE  ?I reviewed the triage vital signs and the nursing notes. ?             ?               ? ?Differential diagnosis includes, but is not limited to, pneumonia, pneumothorax, PE. ? ?Patient presents to the emergency department today because of concern for increasing shortness of breath, fever, from cancer center. On exam patient is febrile, tachycardic. Work up done at cancer center without leukocytosis. Lactic acid wnl here. CXR did show known cancerous lesions. However given concern for possible PE a CT was obtained. No PE but raised concern for atypical infection. Patient was started on broad spectrum antibiotics. I discussed with Dr. Tobie Poet with the hospitalist service who will plan on admission. Discussed plan and findings with patient.  ? ?FINAL CLINICAL IMPRESSION(S) / ED DIAGNOSES  ? ?Final diagnoses:  ?SOB (shortness of breath)  ?Pneumonia due to infectious organism, unspecified laterality, unspecified part of lung  ? ? ? ?Note:  This document was prepared using Dragon voice recognition software and may include unintentional dictation errors. ? ?  ?Nance Pear, MD ?02/03/22 1919 ? ?

## 2022-02-03 NOTE — Assessment & Plan Note (Signed)
-   Does not appear to be in acute exacerbation at this time ?- Resumed home maintenance inhaler and rescue inhalers ?

## 2022-02-03 NOTE — Assessment & Plan Note (Signed)
-   Patient states that he does not wear his CPAP machine at home ?- CPAP machine inpatient was offered, patient declined ?

## 2022-02-03 NOTE — Assessment & Plan Note (Addendum)
Resolved.  No bowel movement since coming to the hospital, see if labs and GI pathogen panel was ordered on admission. ?Discontinue C. difficile and GI pathogen panel ? ?

## 2022-02-03 NOTE — Hospital Course (Addendum)
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 ? ?Tmax at oncology clinic showed temperature of 102.4, in the ED is 98.6, respiration rate elevated at 24, heart rate of 106, blood pressure 142/68, improved to 118/71, SPO2 of 94% on 2 L [place]. ? ?Serum sodium 123, potassium 4.1, chloride 87, bicarb 28, BUN of 13, serum creatinine of 0.67, GFR greater than 60, nonfasting blood glucose 119, WBC 8.9, hemoglobin 10.5, platelets of 300. ? ?CTA of the chest to assess for PE with and without contrast: Read as progressive multi cavitary lesion occupying good portion of the right upper lobe, highly suspicious for progressive neoplasm.  Enlarging right infrahilar and mediastinal lymph nodes.  Several small solid pulmonary nodules highly suspicious for metastatic disease.  Patchy nodular airspace disease with surrounding tree-in-bud appearance most likely superimposed atypical infectious process such as MAC.  Soft tissue nodularity in the right middle lobe and right lower lobe has progressed and this could be progressive infection or progressive tumor.  Severe underlying emphysematous changes.  Stable atherosclerotic calcification involving the thoracic aorta and branch vessels including the coronary arteries. ? ?ED treatment: Acetaminophen 650 mg p.o. one-time dose, cefepime 2 g IV, LR 1 L bolus, vancomycin 1 g IV. ? ?4/6: Patient is having worsening shortness of breath going on for a while, normally sleeps in a chair as it is difficult for him to stay lying down due to respiratory difficulty.  He continues to smoke. ?Infectious  disease and pulmonary was consulted for concern of MAC. ?Per patient he is having pneumonia for a long time and completed multiple antibiotic courses recently.  He was quite tachypneic and using accessory muscles.  Wife at bedside. ?Patient and wife has a lot of questions regarding disease progression, there is chronic pneumonia and asking about further possibilities of treatment versus comfort only-message was sent to Dr. Grayland Ormond to answer these concerns. ?Sputum culture was also ordered. ?Vancomycin was discontinued due to slight increase in creatinine and MRSA swab being negative. ?Sodium increased to 129 from 123 after getting some IV fluid. ?Procalcitonin at 0.21.  Magnesium of 1.4 today-IV magnesium replacement ordered. ?Blood cultures remain negative in 24 hours. ? ?4/7: Infectious disease, pulmonology and oncology saw him, please see their note for further detail.  Current deterioration shown on CT can be multifactorial which include disease progression versus infection which can be fungal or opportunistic.  Respiratory viral panel negative.  Worsening leukocytosis, remained afebrile.  Respiratory cultures, labs for histoplasma, cryptococcal antigen, strep pneumo, Aspergillus, acid-fast culture and smear and Legionella are still pending.  Patient also need a bronchoscopy with BAL culture and a possible biopsy to determine the exact cause. ? ?Patient was requesting discharge to spend the Easter weekend with the family.  Discussed with pulmonology and infectious disease and they are recommending Augmentin and Zithromax and he need to follow-up with his pulmonologist early next week for further management and possible bronchoscopy.  Patient already had an appointment for a repeat PET scan which he should continue.  He will continue his appointment with his oncologist after the PET scan to determine the further course of management. ? ?We stopped midodrine as blood pressure remained little elevated. ?  He also  received 2 doses of IV Lasix to keep the lungs little dry. ? ?Patient has very poor prognosis and very high risk for deterioration and death. ? ? ? ? ?

## 2022-02-03 NOTE — H&P (Addendum)
?History and Physical  ? ?Paul Carpenter ELF:[id number] DOB: [birth year] DOA: 02/03/2022 ? ?PCP: [name], MD  ?Outpatient Specialists: Dr. Grayland O[facility] medical oncology ?Patient coming from: Oncology clinic ? ?I have personally briefly reviewed patient's old medical records in [place]. ? ?Chief Concern: Shortness of breath ? 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 ? ?Tmax at oncology clinic showed temperature of 102.4, in the ED is 98.6, respiration rate elevated at 24, heart rate of 106, blood pressure 142/68, improved to 118/71, SPO2 of 94%. ? ?Serum sodium 123, potassium 4.1, chloride 87, bicarb 28, BUN of 13, serum creatinine of 0.67, GFR greater than 60, nonfasting blood glucose 119, WBC 8.9, hemoglobin 10.5, platelets of 300. ? ?CTA of the chest to assess for PE with and without contrast: Read as progressive multi cavitary lesion occupying good portion of the right upper lobe, highly suspicious for progressive neoplasm.  Enlarging right infrahilar and mediastinal lymph nodes.  Several small solid pulmonary nodules highly suspicious for metastatic disease.  Patchy nodular airspace disease with surrounding tree-in-bud appearance most likely superimposed atypical infectious process such as MAC.  Soft tissue nodularity in the right middle lobe and right lower lobe has progressed and this could be progressive infection or progressive tumor.  Severe underlying emphysematous changes.  Stable atherosclerotic calcification involving the thoracic aorta and branch vessels including the coronary  arteries. ? ?ED treatment: Acetaminophen 650 mg p.o. one-time dose, cefepime 2 g IV, LR 1 L bolus, vancomycin 1 g IV. ? ?At bedside, he is able to tell me his name, age, current year and current location.  ? ?He endorses that he has been having increase coughing that he just can't stop. The cough is productive that is pale yellow that is not normal for  him. He states it is better than the sputum before for dark green. He has been having increase shortness of breath. He endorses watery diarrhea three times on 02/02/22. He denies laxatives use. He reports watery diarrhea today as well. He states the stool was green.  ? ?He reports that he has been wearing his oxygen in his nose consistently.  And he states that in the past week his oxygen reading has decreased to 82 to mid 80s several times.  He did not increase his oxygen liter at home.  He reports that he is diagnosed with obstructive sleep apnea, he does have a CPAP mask at home however he does not wear it. ? ?He states that over the last month, he has been sleeping in a chair on the porch as it is difficult for him to sleep laying down. ? ?Social history: He lives with his wife. He is a current tobacco user and smokes less than 2 ppd. He drinks infrequently, 3-5 beers per week.  He formally worked in [employer]. ? ?Vaccination history: He states he is fully vaccinated for covid and influenza  ? ?ROS: ?Constitutional: no weight change, + fever ?ENT/Mouth: no sore throat, no rhinorrhea ?Eyes: no eye pain, no vision changes ?Cardiovascular: no chest pain, no dyspnea,  no edema, no palpitations ?Respiratory: + cough, + sputum, no wheezing ?Gastrointestinal: no nausea, no vomiting, no  diarrhea, no constipation ?Genitourinary: no urinary incontinence, no dysuria, no hematuria ?Musculoskeletal: no arthralgias, no myalgias, + right sided pain ?Skin: no skin lesions, no pruritus, ?Neuro: + weakness, no loss of consciousness, no syncope ?Psych: no anxiety, no depression, +  decrease appetite ?Heme/Lymph: no bruising, no bleeding ? ?ED Course: Discussed with EDP, patient requiring hospitalization for chief concerns of shortness of breath with fever. ? ?Assessment/Plan ? ?Principal Problem: ?  Shortness of breath ?Active Problems: ?  COPD (chronic obstructive pulmonary disease) ([place]) ?  Obstructive sleep apnea ?  Cigarette smoker ?  Hyponatremia ?  Anxiety ?  Benign essential hypertension ?  CAD (coronary artery disease) ?  Cervical radiculopathy ?  Chronic coronary artery disease ?  Mixed hyperlipidemia ?  Non-small cell lung cancer, right ([place]) ?  Hypothyroidism, acquired ?  Musculoskeletal chest pain ?  Diarrhea ?  ?Assessment and Plan: ? ?* Shortness of breath ?- Met sepsis criteria with elevated respiration rate, heart rate, fever, with suspected source of pneumonia ?- Etiology work-up in progress, given fever, infectious etiology/pneumonia is the primary differential ?- Check procalcitonin ?- Blood cultures x2 are in process ?- Continue broad-spectrum antibiotics with vancomycin and cefepime per pharmacy ?- Added azithromycin 500 mg IV, 5 doses, for atypical coverage ?- Status post lactated ringer 1 L bolus per EDP, ordered additional sodium chloride 1 L bolus, sodium chloride IVF with 125 mL/h, 12 hours ordered ?- Admit to telemetry medical, observation ? ?Diarrhea ?- C. difficile and GI panel ordered ? ?Musculoskeletal chest pain ?- Right anterior to right lateral chest, at the approximate level of T8-T12 ?- Patient states the pain started after coughing onset, about 2 weeks ago and is persistent ?- Reproducible with moderate strength palpation ?- Lidocaine patch ordered ? ?Hypothyroidism, acquired ?- Resume home levothyroxine 150 mcg daily ? ?Non-small cell lung cancer, right ([place]) ?- Continue follow-up with outpatient medical oncology ?- Stop smoking ? ?Mixed hyperlipidemia ?- Atorvastatin 40 mg nightly resumed ? ?Chronic coronary artery disease ?- Nitroglycerin sublingual 0.4  mg every 5 hours as needed for chest pain ?- Atorvastatin 40 mg nightly ? ?Benign essential hypertension ?- Hydralazine 100 mg p.o. every 6 hours as needed for SBP greater than 170, 4 days ordered ? ?Anxiety ?- Resumed home diazepam 5 mg p.o. nightly as needed for anxiety and sleep ? ?Hyponatremia ?- Serum sodium within the last month had the range of 1 23-1 30 ?- Serum sodium on presentation is 123 ?- Presumed secondary to SIADH ?- Sodium chloride 1 L bolus ordered, with maintenance IVF with sodium chloride for sepsis as above ?- BMP in the a.m. ? ?Cigarette smoker ?- Nicotine patch as needed for nicotine craving ordered ? ?Obstructive sleep apnea ?- Patient states that he does not wear his CPAP machine at home ?- CPAP machine inpatient was offered, patient declined ? ?COPD (chronic obstructive pulmonary disease) ([place]) ?- Does not appear to be in acute exacerbation at this time ?- Resumed home maintenance inhaler and rescue inhalers ? ?Chart reviewed.  ? ?DVT prophylaxis: enoxaparin ?Code Status: full code  ?Diet: Heart healthy ?Family Communication: I offered to call his family to update and he declined, stating that they they know he is in the hospital and that he will be admitted. ?Disposition Plan: Clinical course ?Consults called: None at this time ?Admission status: Telemetry medical, observation ? ?Past Medical History:  ?Diagnosis Date  ? Anginal pain ([place])   ? Anxiety   ? Asthma   ? Chest pain   ?  CHF (congestive heart failure) ([place])   ? Chicken pox   ? Complication of anesthesia   ? o2 dropped after neck fusion  ? COPD (chronic obstructive pulmonary disease) ([place])   ? Coronary artery disease   ? Cough   ? chronic  clear phlegm  ? Dysrhythmia   ? palpitations  ? GERD (gastroesophageal reflux disease)   ? h/o reflux/ hoarsness  ? Hematochezia   ? Hemorrhoids   ? History of chickenpox   ? History of colon polyps   ? History of Helicobacter pylori infection   ? Hoarseness   ? Hypertension   ? Lung cancer  ([place]) 05/2016  ? Chemo + rad tx's.   ? Migraines   ? OSA (obstructive sleep apnea)   ? has CPAP but does not use  ? Personal history of tobacco use, presenting hazards to health 03/05/2016  ? Pneumonia

## 2022-02-03 NOTE — ED Notes (Signed)
CXR at bedside at this time ?

## 2022-02-03 NOTE — ED Notes (Signed)
Pt presents to the ED from the cancer center, pt actively getting treatment for lung cancer. Per pt, pt has increased SOB since Saturday. Pt does have a cough but not coughing anything up. At the cancer center, per staff pt had a low grade fever and tachypnea. On arrival, pt SOB and unable to speak in full sentences. O2 level in the low 90s, pt placed on 2L  for comfort. Pt is A&OX4 and NAD ?

## 2022-02-03 NOTE — ED Notes (Signed)
Patient transported to CT 

## 2022-02-03 NOTE — Assessment & Plan Note (Addendum)
-   Right anterior to right lateral chest, at the approximate level of T8-T12 ?- Patient states the pain started after coughing onset, about 2 weeks ago and is persistent ?- Reproducible with moderate strength palpation ?- Lidocaine patch ordered ?

## 2022-02-03 NOTE — Assessment & Plan Note (Addendum)
-   Nitroglycerin sublingual 0.4 mg every 5 hours as needed for chest pain ?- Atorvastatin 40 mg nightly ?

## 2022-02-03 NOTE — Assessment & Plan Note (Signed)
-   Resume home levothyroxine 150 mcg daily ?

## 2022-02-03 NOTE — Progress Notes (Signed)
Pharmacy Antibiotic Note ? ?Paul Carpenter is a 66 y.o. male admitted on 02/03/2022 with sepsis.  Pharmacy has been consulted for vancomycin and cefepime dosing. ? ?-admit to ED via cancer center c/o SHOB, tachypnea, low grade fever. Hx lung cancer ?-per NP note from cancer center 4/5:patient was treated in early March with Levaquin for multifocal pneumonia ? ?Plan: ?Patient received Cefepime 2 gm x 1 in ED ?-will continue with Cefepime 2 gm IV q8h ? ?patient received Vancomycin 1 gram IV x 1 in ED. Will order another 1 gram dose for a total loading dose of 2000 mg Vancomycin ?-will continue with Vancomycin 1500mg  IV q12h ? Goal AUC 400-550. ?Expected AUC: 510 ?SCr used: 0.80 (actual 0.67) ?Cmin 13.6  ? ?  ? ?Temp (24hrs), Avg:100.4 ?F (38 ?C), Min:98.6 ?F (37 ?C), Max:102.4 ?F (39.1 ?C) ? ?Recent Labs  ?Lab 01/28/22 ?0859 02/03/22 ?1450 02/03/22 ?1613  ?WBC 15.2* 8.9  --   ?CREATININE 0.61 0.67  --   ?LATICACIDVEN  --   --  1.3  ?  ?Estimated Creatinine Clearance: 102.4 mL/min (by C-G formula based on SCr of 0.67 mg/dL).   ? ?Allergies  ?Allergen Reactions  ? Lisinopril Rash  ? Varenicline Rash  ? ? ?Antimicrobials this admission: ?cefepime 4/5 >>   ?vanc 4/5 >>   ? ?Dose adjustments this admission: ?  ? ?Microbiology results: ?75/5 BCx: pending ?  UCx:    ?  Sputum:    ?4/5 MRSA PCR: pending ?4/5 strep neg ?4/5 respiratory cx pend ? ?Thank you for allowing pharmacy to be a part of this patient?s care. ? ?Rashel Okeefe A ?02/03/2022 7:45 PM ? ?

## 2022-02-03 NOTE — Assessment & Plan Note (Addendum)
There is a disease progression concern on CT chest. ?Oncology is aware and will talk with patient as he has a lot of questions regarding moving forward. ?- Continue follow-up with outpatient medical oncology ?- Stop smoking ?

## 2022-02-03 NOTE — Assessment & Plan Note (Signed)
-   Atorvastatin 40 mg nightly resumed 

## 2022-02-03 NOTE — Progress Notes (Signed)
Pt in [facility] with reports of weakness, lethargy, nausea, and right side thoracic pain.  ?

## 2022-02-03 NOTE — ED Notes (Signed)
Pt up to the bathroom, pt unhooked his O2 and monitoring equipment. Instructed pt and family member to call nursing staff next time pt needs to use the restroom, pt and family verbalized understanding.  ?

## 2022-02-03 NOTE — Assessment & Plan Note (Signed)
-   Resumed home diazepam 5 mg p.o. nightly as needed for anxiety and sleep ?

## 2022-02-03 NOTE — ED Notes (Signed)
Dr. Archie Balboa at bedside for reevaluation ?

## 2022-02-03 NOTE — Assessment & Plan Note (Addendum)
-  Met sepsis criteria with elevated respiration rate, heart rate, fever, with suspected source of pneumonia ?- Etiology work-up in progress, given fever, infectious etiology/pneumonia is the primary differential ?- Check procalcitonin-0.21 ?- Blood cultures x2 remain negative so far. ?- Patient was placed on cefepime, Zithromax and vancomycin. ?MRSA swab negative-discontinue vancomycin. ?-Continue with cefepime and Zithromax. ?-ID and pulmonology consult ?

## 2022-02-03 NOTE — Telephone Encounter (Signed)
Spoke to wife. She is agreeable to bring pt in for labs/smc this afternoon. Appointments scheduled.  ?

## 2022-02-04 DIAGNOSIS — J984 Other disorders of lung: Secondary | ICD-10-CM

## 2022-02-04 DIAGNOSIS — Z981 Arthrodesis status: Secondary | ICD-10-CM | POA: Diagnosis not present

## 2022-02-04 DIAGNOSIS — E782 Mixed hyperlipidemia: Secondary | ICD-10-CM | POA: Diagnosis present

## 2022-02-04 DIAGNOSIS — E222 Syndrome of inappropriate secretion of antidiuretic hormone: Secondary | ICD-10-CM | POA: Diagnosis present

## 2022-02-04 DIAGNOSIS — A419 Sepsis, unspecified organism: Secondary | ICD-10-CM | POA: Diagnosis present

## 2022-02-04 DIAGNOSIS — J189 Pneumonia, unspecified organism: Secondary | ICD-10-CM

## 2022-02-04 DIAGNOSIS — G4733 Obstructive sleep apnea (adult) (pediatric): Secondary | ICD-10-CM | POA: Diagnosis present

## 2022-02-04 DIAGNOSIS — Z79899 Other long term (current) drug therapy: Secondary | ICD-10-CM | POA: Diagnosis not present

## 2022-02-04 DIAGNOSIS — Z888 Allergy status to other drugs, medicaments and biological substances status: Secondary | ICD-10-CM | POA: Diagnosis not present

## 2022-02-04 DIAGNOSIS — Z8249 Family history of ischemic heart disease and other diseases of the circulatory system: Secondary | ICD-10-CM | POA: Diagnosis not present

## 2022-02-04 DIAGNOSIS — Y95 Nosocomial condition: Secondary | ICD-10-CM | POA: Diagnosis present

## 2022-02-04 DIAGNOSIS — F17210 Nicotine dependence, cigarettes, uncomplicated: Secondary | ICD-10-CM | POA: Diagnosis present

## 2022-02-04 DIAGNOSIS — F419 Anxiety disorder, unspecified: Secondary | ICD-10-CM | POA: Diagnosis present

## 2022-02-04 DIAGNOSIS — I7300 Raynaud's syndrome without gangrene: Secondary | ICD-10-CM | POA: Diagnosis present

## 2022-02-04 DIAGNOSIS — Y842 Radiological procedure and radiotherapy as the cause of abnormal reaction of the patient, or of later complication, without mention of misadventure at the time of the procedure: Secondary | ICD-10-CM | POA: Diagnosis not present

## 2022-02-04 DIAGNOSIS — I110 Hypertensive heart disease with heart failure: Secondary | ICD-10-CM | POA: Diagnosis present

## 2022-02-04 DIAGNOSIS — K219 Gastro-esophageal reflux disease without esophagitis: Secondary | ICD-10-CM | POA: Diagnosis present

## 2022-02-04 DIAGNOSIS — I5022 Chronic systolic (congestive) heart failure: Secondary | ICD-10-CM | POA: Diagnosis present

## 2022-02-04 DIAGNOSIS — J9621 Acute and chronic respiratory failure with hypoxia: Secondary | ICD-10-CM | POA: Diagnosis present

## 2022-02-04 DIAGNOSIS — L598 Other specified disorders of the skin and subcutaneous tissue related to radiation: Secondary | ICD-10-CM

## 2022-02-04 DIAGNOSIS — Z9981 Dependence on supplemental oxygen: Secondary | ICD-10-CM | POA: Diagnosis not present

## 2022-02-04 DIAGNOSIS — Z8042 Family history of malignant neoplasm of prostate: Secondary | ICD-10-CM | POA: Diagnosis not present

## 2022-02-04 DIAGNOSIS — E039 Hypothyroidism, unspecified: Secondary | ICD-10-CM | POA: Diagnosis present

## 2022-02-04 DIAGNOSIS — C3491 Malignant neoplasm of unspecified part of right bronchus or lung: Secondary | ICD-10-CM

## 2022-02-04 DIAGNOSIS — Z79890 Hormone replacement therapy (postmenopausal): Secondary | ICD-10-CM | POA: Diagnosis not present

## 2022-02-04 DIAGNOSIS — J439 Emphysema, unspecified: Secondary | ICD-10-CM | POA: Diagnosis present

## 2022-02-04 DIAGNOSIS — R0602 Shortness of breath: Secondary | ICD-10-CM | POA: Diagnosis present

## 2022-02-04 DIAGNOSIS — I2510 Atherosclerotic heart disease of native coronary artery without angina pectoris: Secondary | ICD-10-CM | POA: Diagnosis present

## 2022-02-04 DIAGNOSIS — Z20822 Contact with and (suspected) exposure to covid-19: Secondary | ICD-10-CM | POA: Diagnosis present

## 2022-02-04 LAB — CBC WITH DIFFERENTIAL/PLATELET
Abs Immature Granulocytes: 0.54 10*3/uL — ABNORMAL HIGH (ref 0.00–0.07)
Basophils Absolute: 0 10*3/uL (ref 0.0–0.1)
Basophils Relative: 0 %
Eosinophils Absolute: 0.2 10*3/uL (ref 0.0–0.5)
Eosinophils Relative: 2 %
HCT: 29 % — ABNORMAL LOW (ref 39.0–52.0)
Hemoglobin: 9.5 g/dL — ABNORMAL LOW (ref 13.0–17.0)
Immature Granulocytes: 5 %
Lymphocytes Relative: 3 %
Lymphs Abs: 0.3 10*3/uL — ABNORMAL LOW (ref 0.7–4.0)
MCH: 30.6 pg (ref 26.0–34.0)
MCHC: 32.8 g/dL (ref 30.0–36.0)
MCV: 93.5 fL (ref 80.0–100.0)
Monocytes Absolute: 1 10*3/uL (ref 0.1–1.0)
Monocytes Relative: 9 %
Neutro Abs: 8.3 10*3/uL — ABNORMAL HIGH (ref 1.7–7.7)
Neutrophils Relative %: 81 %
Platelets: 250 10*3/uL (ref 150–400)
RBC: 3.1 MIL/uL — ABNORMAL LOW (ref 4.22–5.81)
RDW: 16.3 % — ABNORMAL HIGH (ref 11.5–15.5)
Smear Review: NORMAL
WBC: 10.4 10*3/uL (ref 4.0–10.5)
nRBC: 0 % (ref 0.0–0.2)

## 2022-02-04 LAB — MAGNESIUM: Magnesium: 1.4 mg/dL — ABNORMAL LOW (ref 1.7–2.4)

## 2022-02-04 LAB — BASIC METABOLIC PANEL
Anion gap: 8 (ref 5–15)
BUN: 10 mg/dL (ref 8–23)
CO2: 25 mmol/L (ref 22–32)
Calcium: 8.3 mg/dL — ABNORMAL LOW (ref 8.9–10.3)
Chloride: 96 mmol/L — ABNORMAL LOW (ref 98–111)
Creatinine, Ser: 0.65 mg/dL (ref 0.61–1.24)
GFR, Estimated: >60 mL/min (ref >60)
Glucose, Bld: 102 mg/dL — ABNORMAL HIGH (ref 70–99)
Potassium: 4.1 mmol/L (ref 3.5–5.1)
Sodium: 129 mmol/L — ABNORMAL LOW (ref 135–145)

## 2022-02-04 LAB — PROTIME-INR
INR: 1.1 (ref 0.8–1.2)
Prothrombin Time: 14.5 seconds (ref 11.4–15.2)

## 2022-02-04 LAB — EXPECTORATED SPUTUM ASSESSMENT W GRAM STAIN, RFLX TO RESP C

## 2022-02-04 LAB — PHOSPHORUS: Phosphorus: 3.4 mg/dL (ref 2.5–4.6)

## 2022-02-04 LAB — CORTISOL-AM, BLOOD: Cortisol - AM: 13.9 ug/dL (ref 6.7–22.6)

## 2022-02-04 LAB — TROPONIN I (HIGH SENSITIVITY)
Troponin I (High Sensitivity): 10 ng/L (ref <18)
Troponin I (High Sensitivity): 13 ng/L (ref <18)

## 2022-02-04 LAB — MRSA NEXT GEN BY PCR, NASAL: MRSA by PCR Next Gen: NOT DETECTED

## 2022-02-04 MED ORDER — IPRATROPIUM-ALBUTEROL 0.5-2.5 (3) MG/3ML IN SOLN
3.0000 mL | Freq: Four times a day (QID) | RESPIRATORY_TRACT | Status: DC
  Administered 2022-02-04 – 2022-02-05 (×5): 3 mL via RESPIRATORY_TRACT
  Filled 2022-02-04 (×5): qty 3

## 2022-02-04 MED ORDER — MORPHINE SULFATE (PF) 2 MG/ML IV SOLN
2.0000 mg | INTRAVENOUS | Status: DC | PRN
  Administered 2022-02-04: 2 mg via INTRAVENOUS
  Filled 2022-02-04: qty 1

## 2022-02-04 MED ORDER — CHLORHEXIDINE GLUCONATE CLOTH 2 % EX PADS
6.0000 | MEDICATED_PAD | Freq: Every day | CUTANEOUS | Status: DC
  Administered 2022-02-04 – 2022-02-05 (×2): 6 via TOPICAL

## 2022-02-04 MED ORDER — MAGNESIUM SULFATE 2 GM/50ML IV SOLN
2.0000 g | Freq: Once | INTRAVENOUS | Status: AC
  Administered 2022-02-04: 2 g via INTRAVENOUS
  Filled 2022-02-04: qty 50

## 2022-02-04 MED ORDER — DM-GUAIFENESIN ER 30-600 MG PO TB12
1.0000 | ORAL_TABLET | Freq: Two times a day (BID) | ORAL | Status: DC
  Administered 2022-02-04 – 2022-02-05 (×3): 1 via ORAL
  Filled 2022-02-04 (×3): qty 1

## 2022-02-04 MED ORDER — FUROSEMIDE 10 MG/ML IJ SOLN
40.0000 mg | Freq: Every day | INTRAMUSCULAR | Status: DC
  Administered 2022-02-04 – 2022-02-05 (×2): 40 mg via INTRAVENOUS
  Filled 2022-02-04 (×2): qty 4

## 2022-02-04 MED ORDER — MORPHINE SULFATE (PF) 4 MG/ML IV SOLN: 4.0000 mg | INTRAVENOUS | Status: DC | PRN

## 2022-02-04 NOTE — Consult Note (Signed)
NAME: Paul Carpenter  ?DOB: 11-28-1955  ?MRN: [id number]  ?Date/Time: 02/04/2022 1:19 PM ? ?REQUESTING PROVIDER: Reesa Chew ?Subjective:  ?REASON FOR CONSULT: r/o MAC ?? ?Paul Carpenter is a 66 y.o. 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 ? ?Past Medical History:  ?Diagnosis Date  ? Anginal pain ([place])   ? Anxiety   ? Asthma   ? Chest pain   ? CHF (congestive heart failure) ([place])   ? Chicken pox   ? Complication of anesthesia   ? o2 dropped after neck fusion  ? COPD (chronic obstructive pulmonary disease) ([place])   ? Coronary artery disease   ? Cough   ? chronic  clear phlegm  ? Dysrhythmia   ? palpitations  ? GERD (gastroesophageal reflux disease)   ? h/o reflux/ hoarsness  ? Hematochezia   ? Hemorrhoids   ? History of chickenpox   ? History of colon polyps   ? History of Helicobacter pylori infection   ? Hoarseness   ? Hypertension   ? Lung cancer ([place]) 05/2016  ? Chemo + rad tx's.   ? Migraines   ? OSA (obstructive sleep apnea)   ? has CPAP but does not use  ? Personal history of tobacco use, presenting hazards to health 03/05/2016  ? Pneumonia   ? 5/17  ? Raynaud disease   ? Raynaud disease   ? Raynaud's disease   ? Rotator cuff tear   ? on right  ? Shortness of breath dyspnea   ? Sleep apnea   ? Ulcer (traumatic) of oral mucosa   ?  ?Past Surgical History:  ?Procedure Laterality Date  ? BACK SURGERY    ? cervical fusion x 2  ? CARDIAC CATHETERIZATION    ? CERVICAL DISCECTOMY    ? x 2  ? COLONOSCOPY    ? COLONOSCOPY N/A 07/25/2015  ? Procedure: COLONOSCOPY;  Surgeon: Lollie Sails, MD;   Location: [facility] ENDOSCOPY;  Service: Endoscopy;  Laterality: N/A;  ? COLONOSCOPY WITH PROPOFOL N/A 10/04/2017  ? Procedure: COLONOSCOPY WITH PROPOFOL;  Surgeon: Lollie Sails, MD;  Location: [facility] ENDOSCOPY;  Service: Endoscopy;  Laterality: N/A;  ? COLONOSCOPY WITH PROPOFOL N/A 07/10/2020  ? Procedure: COLONOSCOPY WITH PROPOFOL;  Surgeon: Toledo, Benay Pike, MD;  Location: [facility] ENDOSCOPY;  Service: Gastroenterology;  Laterality: N/A;  ? ELECTROMAGNETIC NAVIGATION BROCHOSCOPY Left 06/28/2016  ? Procedure: ELECTROMAGNETIC NAVIGATION BRONCHOSCOPY;  Surgeon: Vilinda Boehringer, MD;  Location: [facility] ORS;  Service: Cardiopulmonary;  Laterality: Left;  ? ENDOBRONCHIAL ULTRASOUND N/A 04/11/2018  ? Procedure: ENDOBRONCHIAL ULTRASOUND;  Surgeon: [name], MD;  Location: [facility] ORS;  Service: Cardiopulmonary;  Laterality: N/A;  ? ESOPHAGOGASTRODUODENOSCOPY N/A 07/25/2015  ? Procedure: ESOPHAGOGASTRODUODENOSCOPY (EGD);  Surgeon: Lollie Sails, MD;  Location: [facility] ENDOSCOPY;  Service: Endoscopy;  Laterality: N/A;  ? ESOPHAGOGASTRODUODENOSCOPY (EGD) WITH PROPOFOL N/A 07/10/2020  ? Procedure: ESOPHAGOGASTRODUODENOSCOPY (EGD) WITH PROPOFOL;  Surgeon: Toledo, Benay Pike, MD;  Location: [facility] ENDOSCOPY;  Service: Gastroenterology;  Laterality: N/A;  ? NASAL SINUS SURGERY    ? x 2   ?  PORTA CATH INSERTION N/A 04/24/2018  ? Procedure: PORTA CATH INSERTION;  Surgeon: Algernon Huxley, MD;  Location: [facility] CV LAB;  Service: Cardiovascular;  Laterality: N/A;  ? rotator cuff surgery Right   ? 07/2016  ? SEPTOPLASTY    ? SKIN GRAFT    ?  ?Social History  ? ?Socioeconomic History  ? Marital status: Married  ?  Spouse name: Not on file  ? Number of children: 3  ? Years of education: Not on file  ? Highest education level: Not on file  ?Occupational History  ? Occupation: Architect Work  ?Tobacco Use  ? Smoking status: Every Day  ?  Packs/day: 3.00  ?  Years: 50.00  ?  Pack years: 150.00  ?  Types: Cigarettes  ? Smokeless tobacco: Never  ?  Tobacco comments:  ?  1.5-2 PPD 11/19/2021  ?Vaping Use  ? Vaping Use: Never used  ?Substance and Sexual Activity  ? Alcohol use: Yes  ?  Alcohol/week: 2.0 standard drinks  ?  Types: 2 Standard drinks or equivalent per week  ?  Comment: moderate  ? Drug use: No  ? Sexual activity: Not on file  ?Other Topics Concern  ? Not on file  ?Social History Narrative  ? Not on file  ? ?Social Determinants of Health  ? ?Financial Resource Strain: Not on file  ?Food Insecurity: Not on file  ?Transportation Needs: Not on file  ?Physical Activity: Not on file  ?Stress: Not on file  ?Social Connections: Not on file  ?Intimate Partner Violence: Not on file  ?  ?Family History  ?Problem Relation Age of Onset  ? Heart disease Father   ? Prostate cancer Father   ? Heart disease Paternal Grandmother   ? Heart attack Maternal Grandfather 52  ? Kidney cancer Neg Hx   ? Bladder Cancer Neg Hx   ? Other Neg Hx   ?     pituitary abnormality  ? ?Allergies  ?Allergen Reactions  ? Lisinopril Rash  ? Varenicline Rash  ? ?I? ?Current Facility-Administered Medications  ?Medication Dose Route Frequency Provider Last Rate Last Admin  ? acetaminophen (TYLENOL) tablet 650 mg  650 mg Oral Q6H PRN Cox, Amy N, DO   650 mg at 02/04/22 1103  ? Or  ? acetaminophen (TYLENOL) suppository 650 mg  650 mg Rectal Q6H PRN Cox, Amy N, DO      ? albuterol (PROVENTIL) (2.5 MG/3ML) 0.083% nebulizer solution 3 mL  3 mL Nebulization Q4H PRN Cox, Amy N, DO      ? atorvastatin (LIPITOR) tablet 40 mg  40 mg Oral QHS Cox, Amy N, DO   40 mg at 02/03/22 2257  ? azithromycin (ZITHROMAX) 500 mg in sodium chloride 0.9 % 250 mL IVPB  500 mg Intravenous Q24H Cox, Amy N, DO   Stopped at 02/04/22 0023  ? budesonide (PULMICORT) nebulizer solution 0.5 mg  0.5 mg Nebulization TID PRN Cox, Amy N, DO      ? ceFEPIme (MAXIPIME) 2 g in sodium chloride 0.9 % 100 mL IVPB  2 g Intravenous Q8H Cox, Amy N, DO   Stopped at 02/04/22 0941  ? Chlorhexidine Gluconate Cloth 2 % PADS 6 each  6 each  Topical Q0600 Lorella Nimrod, MD   6 each at 02/04/22 [phone]  ? chlorpheniramine-HYDROcodone 10-8 MG/5ML suspension 5 mL  5 mL Oral Q12H PRN Cox, Amy N, DO   5 mL at 02/04/22 0259  ? dextromethorphan-guaiFENesin ([place] DM) 30-600 MG per  12 hr tablet 1 tablet  1 tablet Oral BID Lorella Nimrod, MD   1 tablet at 02/04/22 0940  ? diazepam (VALIUM) tablet 5 mg  5 mg Oral QHS PRN Cox, Amy N, DO   5 mg at 02/03/22 2324  ? enoxaparin (LOVENOX) injection 40 mg  40 mg Subcutaneous Q24H Cox, Amy N, DO   40 mg at 02/03/22 2256  ? fluticasone furoate-vilanterol (BREO ELLIPTA) 200-25 MCG/ACT 1 puff  1 puff Inhalation Daily Cox, Amy N, DO   1 puff at 02/04/22 0826  ? And  ? umeclidinium bromide (INCRUSE ELLIPTA) 62.5 MCG/ACT 1 puff  1 puff Inhalation Daily Cox, Amy N, DO   1 puff at 02/04/22 0825  ? hydrALAZINE (APRESOLINE) tablet 10 mg  10 mg Oral Q6H PRN Cox, Amy N, DO      ? ipratropium-albuterol (DUONEB) 0.5-2.5 (3) MG/3ML nebulizer solution 3 mL  3 mL Nebulization Q6H Amin, Soundra Pilon, MD      ? ketorolac (TORADOL) 15 MG/ML injection 15 mg  15 mg Intravenous Q6H PRN Cox, Amy N, DO   15 mg at 02/03/22 2256  ? levothyroxine (SYNTHROID) tablet 150 mcg  150 mcg Oral Q0600 Cox, Amy N, DO   150 mcg at 02/04/22 0829  ? lidocaine (LIDODERM) 5 % 1 patch  1 patch Transdermal Q24H Cox, Amy N, DO   1 patch at 02/03/22 2255  ? magnesium sulfate IVPB 2 g 50 mL  2 g Intravenous Once Lorella Nimrod, MD 50 mL/hr at 02/04/22 1259 2 g at 02/04/22 1259  ? metoprolol tartrate (LOPRESSOR) injection 5 mg  5 mg Intravenous Q2H PRN Cox, Amy N, DO      ? midodrine (PROAMATINE) tablet 5 mg  5 mg Oral TID PRN Cox, Amy N, DO      ? morphine (PF) 2 MG/ML injection 2 mg  2 mg Intravenous Q4H PRN Cox, Amy N, DO   2 mg at 02/04/22 0259  ? nicotine (NICODERM CQ - dosed in mg/24 hours) patch 21 mg  21 mg Transdermal Daily PRN Cox, Amy N, DO   21 mg at 02/03/22 2001  ? nitroGLYCERIN (NITROSTAT) SL tablet 0.4 mg  0.4 mg Sublingual Q5 min PRN Cox, Amy N, DO      ?  ondansetron (ZOFRAN) tablet 4 mg  4 mg Oral Q6H PRN Cox, Amy N, DO      ? Or  ? ondansetron (ZOFRAN) injection 4 mg  4 mg Intravenous Q6H PRN Cox, Amy N, DO      ? pantoprazole (PROTONIX) EC tablet 40 mg  40 mg

## 2022-02-04 NOTE — Assessment & Plan Note (Signed)
Please see above. ?Also concern of MAC ?-Ordered sputum culture ?

## 2022-02-04 NOTE — Assessment & Plan Note (Signed)
Patient has chronic hyponatremia with baseline sodium appears to be around  128-130, most likely has SIADH secondary to lung cancer. ?He was on salt tablets at home which he will continue. ?

## 2022-02-04 NOTE — Consult Note (Signed)
? ? ? ?PULMONOLOGY ? ? ? ? ? ? ? ? ?Date: 02/04/2022,   ?MRN# [id number] Paul Carpenter January 15, 1956 ? ? ?  ?AdmissionWeight: 91.2 kg                 ?CurrentWeight: 91.2 kg ? ?Referring provider: Dr Reesa Chew ? ? ?CHIEF COMPLAINT:  ? ?Acute on chronic hypoxemic respiratory failure ? ? ?HISTORY OF PRESENT ILLNESS  ? ?This is a patient with complex medical history as below with significant pulmonary history including OSA , asthma, COPD, Lung cancer s/p chemoradiation therapy with chronic dyspnea and hypoxemia. He came in with febrile illness.Serum sodium 123, potassium 4.1, chloride 87, bicarb 28, BUN of 13, serum creatinine of 0.67, GFR greater than 60, nonfasting blood glucose 119, WBC 8.9, hemoglobin 10.5, platelets of 300. CTPE done with findigs of  progressive multi cavitary lesion occupying good portion of the right upper lobe, highly suspicious for progressive neoplasm.  Enlarging right infrahilar and mediastinal lymph nodes.  Several small solid pulmonary nodules highly suspicious for metastatic disease.  Patchy nodular airspace disease with surrounding tree-in-bud appearance most likely superimposed atypical infectious process such as MAC.  Soft tissue nodularity in the right middle lobe and right lower lobe has progressed and this could be progressive infection or progressive tumor.  Severe underlying emphysematous changes.  Stable atherosclerotic calcification involving the thoracic aorta and branch vessels including the coronary arteries. ? ?Despite severe comorbidities he continues to smoke daily. Due to abnormal chest imaging with febrile illness PCCM consultation placed for further evaluation and management.  ? ?PAST MEDICAL HISTORY  ? ?Past Medical History:  ?Diagnosis Date  ? Anginal pain ([place])   ? Anxiety   ? Asthma   ? Chest pain   ? CHF (congestive heart failure) ([place])   ? Chicken pox   ? Complication of anesthesia   ? o2 dropped after neck fusion  ? COPD (chronic obstructive pulmonary disease) ([place])   ?  Coronary artery disease   ? Cough   ? chronic  clear phlegm  ? Dysrhythmia   ? palpitations  ? GERD (gastroesophageal reflux disease)   ? h/o reflux/ hoarsness  ? Hematochezia   ? Hemorrhoids   ? History of chickenpox   ? History of colon polyps   ? History of Helicobacter pylori infection   ? Hoarseness   ? Hypertension   ? Lung cancer ([place]) 05/2016  ? Chemo + rad tx's.   ? Migraines   ? OSA (obstructive sleep apnea)   ? has CPAP but does not use  ? Personal history of tobacco use, presenting hazards to health 03/05/2016  ? Pneumonia   ? 5/17  ? Raynaud disease   ? Raynaud disease   ? Raynaud's disease   ? Rotator cuff tear   ? on right  ? Shortness of breath dyspnea   ? Sleep apnea   ? Ulcer (traumatic) of oral mucosa   ? ? ? ?SURGICAL HISTORY  ? ?Past Surgical History:  ?Procedure Laterality Date  ? BACK SURGERY    ? cervical fusion x 2  ? CARDIAC CATHETERIZATION    ? CERVICAL DISCECTOMY    ? x 2  ? COLONOSCOPY    ? COLONOSCOPY N/A 07/25/2015  ? Procedure: COLONOSCOPY;  Surgeon: Lollie Sails, MD;  Location: [facility] ENDOSCOPY;  Service: Endoscopy;  Laterality: N/A;  ? COLONOSCOPY WITH PROPOFOL N/A 10/04/2017  ? Procedure: COLONOSCOPY WITH PROPOFOL;  Surgeon: Lollie Sails, MD;  Location: [facility] ENDOSCOPY;  Service: Endoscopy;  Laterality: N/A;  ? COLONOSCOPY WITH PROPOFOL N/A 07/10/2020  ? Procedure: COLONOSCOPY WITH PROPOFOL;  Surgeon: Toledo, Benay Pike, MD;  Location: [facility] ENDOSCOPY;  Service: Gastroenterology;  Laterality: N/A;  ? ELECTROMAGNETIC NAVIGATION BROCHOSCOPY Left 06/28/2016  ? Procedure: ELECTROMAGNETIC NAVIGATION BRONCHOSCOPY;  Surgeon: Vilinda Boehringer, MD;  Location: [facility] ORS;  Service: Cardiopulmonary;  Laterality: Left;  ? ENDOBRONCHIAL ULTRASOUND N/A 04/11/2018  ? Procedure: ENDOBRONCHIAL ULTRASOUND;  Surgeon: [name], MD;  Location: [facility] ORS;  Service: Cardiopulmonary;  Laterality: N/A;  ? ESOPHAGOGASTRODUODENOSCOPY N/A 07/25/2015  ? Procedure: ESOPHAGOGASTRODUODENOSCOPY (EGD);  Surgeon: Lollie Sails, MD;  Location: [facility] ENDOSCOPY;  Service: Endoscopy;  Laterality: N/A;  ? ESOPHAGOGASTRODUODENOSCOPY (EGD) WITH PROPOFOL N/A 07/10/2020  ? Procedure: ESOPHAGOGASTRODUODENOSCOPY (EGD) WITH PROPOFOL;  Surgeon: Toledo, Benay Pike, MD;  Location: [facility] ENDOSCOPY;  Service: Gastroenterology;  Laterality: N/A;  ? NASAL SINUS SURGERY    ? x 2   ? PORTA CATH INSERTION N/A 04/24/2018  ? Procedure: PORTA CATH INSERTION;  Surgeon: Algernon Huxley, MD;  Location: [facility];  Service: Cardiovascular;  Laterality: N/A;  ? rotator cuff surgery Right   ? 07/2016  ? SEPTOPLASTY    ? SKIN GRAFT    ? ? ? ?FAMILY HISTORY  ? ?Family History  ?Problem Relation Age of Onset  ? Heart disease Father   ? Prostate cancer Father   ? Heart disease Paternal Grandmother   ? Heart attack Maternal Grandfather 52  ? Kidney cancer Neg Hx   ? Bladder Cancer Neg Hx   ? Other Neg Hx   ?     pituitary abnormality  ? ? ? ?SOCIAL HISTORY  ? ?Social History  ? ?Tobacco Use  ? Smoking status: Every Day  ?  Packs/day: 3.00  ?  Years: 50.00  ?  Pack years: 150.00  ?  Types: Cigarettes  ? Smokeless tobacco: Never  ? Tobacco comments:  ?  1.5-2 PPD 11/19/2021  ?Vaping Use  ? Vaping Use: Never used  ?Substance Use Topics  ? Alcohol use: Yes  ?  Alcohol/week: 2.0 standard drinks  ?  Types: 2 Standard drinks or equivalent per week  ?  Comment: moderate  ? Drug use: No  ? ? ? ?MEDICATIONS  ? ? ?Home Medication:  ?  ?Current Medication: ? ?Current Facility-Administered Medications:  ?  acetaminophen (TYLENOL) tablet 650 mg, 650 mg, Oral, Q6H PRN, 650 mg at 02/04/22 1103 **OR** acetaminophen (TYLENOL) suppository 650 mg, 650 mg, Rectal, Q6H PRN, Cox, Amy N, DO ?  albuterol (PROVENTIL) (2.5 MG/3ML) 0.083% nebulizer solution 3 mL, 3 mL, Nebulization, Q4H PRN, Cox, Amy N, DO ?  atorvastatin (LIPITOR) tablet 40 mg, 40 mg, Oral, QHS, Cox, Amy N, DO, 40 mg at 02/03/22 2257 ?  azithromycin (ZITHROMAX) 500 mg in sodium chloride 0.9 % 250 mL IVPB, 500 mg,  Intravenous, Q24H, Cox, Amy N, DO, Stopped at 02/04/22 0023 ?  budesonide (PULMICORT) nebulizer solution 0.5 mg, 0.5 mg, Nebulization, TID PRN, Cox, Amy N, DO ?  ceFEPIme (MAXIPIME) 2 g in sodium chloride 0.9 % 100 mL IVPB, 2 g, Intravenous, Q8H, Cox, Amy N, DO, Last Rate: 200 mL/hr at 02/04/22 1608, 2 g at 02/04/22 1608 ?  Chlorhexidine Gluconate Cloth 2 % PADS 6 each, 6 each, Topical, Q0600, Lorella Nimrod, MD, 6 each at 02/04/22 [id number] ?  chlorpheniramine-HYDROcodone 10-8 MG/5ML suspension 5 mL, 5 mL, Oral, Q12H PRN, Cox, Amy N, DO, 5 mL at 02/04/22 1617 ?  dextromethorphan-guaiFENesin [facility]  DM) 30-600 MG per 12 hr tablet 1 tablet, 1 tablet, Oral, BID, Lorella Nimrod, MD, 1 tablet at 02/04/22 0940 ?  diazepam (VALIUM) tablet 5 mg, 5 mg, Oral, QHS PRN, Cox, Amy N, DO, 5 mg at 02/03/22 2324 ?  enoxaparin (LOVENOX) injection 40 mg, 40 mg, Subcutaneous, Q24H, Cox, Amy N, DO, 40 mg at 02/03/22 2256 ?  fluticasone furoate-vilanterol (BREO ELLIPTA) 200-25 MCG/ACT 1 puff, 1 puff, Inhalation, Daily, 1 puff at 02/04/22 0826 **AND** umeclidinium bromide (INCRUSE ELLIPTA) 62.5 MCG/ACT 1 puff, 1 puff, Inhalation, Daily, Cox, Amy N, DO, 1 puff at 02/04/22 0825 ?  hydrALAZINE (APRESOLINE) tablet 10 mg, 10 mg, Oral, Q6H PRN, Cox, Amy N, DO ?  ipratropium-albuterol (DUONEB) 0.5-2.5 (3) MG/3ML nebulizer solution 3 mL, 3 mL, Nebulization, Q6H, Amin, Sumayya, MD, 3 mL at 02/04/22 1402 ?  ketorolac (TORADOL) 15 MG/ML injection 15 mg, 15 mg, Intravenous, Q6H PRN, Cox, Amy N, DO, 15 mg at 02/04/22 1613 ?  levothyroxine (SYNTHROID) tablet 150 mcg, 150 mcg, Oral, Q0600, Cox, Amy N, DO, 150 mcg at 02/04/22 0829 ?  lidocaine (LIDODERM) 5 % 1 patch, 1 patch, Transdermal, Q24H, Cox, Amy N, DO, 1 patch at 02/03/22 2255 ?  metoprolol tartrate (LOPRESSOR) injection 5 mg, 5 mg, Intravenous, Q2H PRN, Cox, Amy N, DO ?  morphine (PF) 2 MG/ML injection 2 mg, 2 mg, Intravenous, Q4H PRN, Cox, Amy N, DO, 2 mg at 02/04/22 0259 ?  nicotine (NICODERM CQ -  dosed in mg/24 hours) patch 21 mg, 21 mg, Transdermal, Daily PRN, Cox, Amy N, DO, 21 mg at 02/03/22 2001 ?  nitroGLYCERIN (NITROSTAT) SL tablet 0.4 mg, 0.4 mg, Sublingual, Q5 min PRN, Cox, Amy N, DO ?  ondans

## 2022-02-04 NOTE — Plan of Care (Signed)
?  Problem: Education: ?Goal: Knowledge of General Education information will improve ?Description: Including pain rating scale, medication(s)/side effects and non-pharmacologic comfort measures ?Outcome: Progressing ?  ?Problem: Health Behavior/Discharge Planning: ?Goal: Ability to manage health-related needs will improve ?Outcome: Progressing ?  ?Problem: Clinical Measurements: ?Goal: Ability to maintain clinical measurements within normal limits will improve ?Outcome: Progressing ?Goal: Will remain free from infection ?Outcome: Progressing ?Goal: Diagnostic test results will improve ?Outcome: Progressing ?Goal: Respiratory complications will improve ?Outcome: Progressing ?Goal: Cardiovascular complication will be avoided ?Outcome: Progressing ?  ?Problem: Activity: ?Goal: Risk for activity intolerance will decrease ?Outcome: Progressing ?  ?Problem: Nutrition: ?Goal: Adequate nutrition will be maintained ?Outcome: Progressing ?  ?Problem: Coping: ?Goal: Level of anxiety will decrease ?Outcome: Progressing ?  ?Problem: Elimination: ?Goal: Will not experience complications related to bowel motility ?Outcome: Progressing ?Goal: Will not experience complications related to urinary retention ?Outcome: Progressing ?  ?Problem: Pain Managment: ?Goal: General experience of comfort will improve ?Outcome: Progressing ?  ?Problem: Safety: ?Goal: Ability to remain free from injury will improve ?Outcome: Progressing ?  ?Problem: Skin Integrity: ?Goal: Risk for impaired skin integrity will decrease ?Outcome: Progressing ?  ?Problem: Education: ?Goal: Knowledge of the prescribed therapeutic regimen will improve ?Outcome: Progressing ?  ?Problem: Coping: ?Goal: Ability to identify and develop effective coping behavior will improve ?Outcome: Progressing ?  ?Problem: Clinical Measurements: ?Goal: Quality of life will improve ?Outcome: Progressing ?  ?Problem: Respiratory: ?Goal: Verbalizations of increased ease of respirations will  increase ?Outcome: Progressing ?  ?Problem: Role Relationship: ?Goal: Family's ability to cope with current situation will improve ?Outcome: Progressing ?Goal: Ability to verbalize concerns, feelings, and thoughts to partner or family member will improve ?Outcome: Progressing ?  ?Problem: Pain Management: ?Goal: Satisfaction with pain management regimen will improve ?Outcome: Progressing ?  ?

## 2022-02-04 NOTE — Progress Notes (Signed)
?Progress Note ? ? ?Patient: Paul Carpenter IRW:[id number] DOB: 08/05/1956 DOA: 02/03/2022     0 ?DOS: the patient was seen and examined on 02/04/2022 ?  ?Brief hospital course: ?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 ? ?Tmax at oncology clinic showed temperature of 102.4, in the ED is 98.6, respiration rate elevated at 24, heart rate of 106, blood pressure 142/68, improved to 118/71, SPO2 of 94% on 2 L [place]. ? ?Serum sodium 123, potassium 4.1, chloride 87, bicarb 28, BUN of 13, serum creatinine of 0.67, GFR greater than 60, nonfasting blood glucose 119, WBC 8.9, hemoglobin 10.5, platelets of 300. ? ?CTA of the chest to assess for PE with and without contrast: Read as progressive multi cavitary lesion occupying good portion of the right upper lobe, highly suspicious for progressive neoplasm.  Enlarging right infrahilar and mediastinal lymph nodes.  Several small solid pulmonary nodules highly suspicious for metastatic disease.  Patchy nodular airspace disease with surrounding tree-in-bud appearance most likely superimposed atypical infectious process such as MAC.  Soft tissue nodularity in the right middle lobe and right lower lobe has progressed and this could be progressive infection or progressive tumor.  Severe underlying emphysematous changes.  Stable atherosclerotic calcification involving the thoracic aorta and branch vessels including the coronary arteries. ? ?ED treatment: Acetaminophen 650 mg p.o. one-time dose, cefepime 2 g IV, LR 1 L bolus, vancomycin 1 g IV. ? ?4/6: Patient is having worsening  shortness of breath going on for a while, normally sleeps in a chair as it is difficult for him to stay lying down due to respiratory difficulty.  He continues to smoke. ?Infectious disease and pulmonary was consulted for concern of MAC. ?Per patient he is having pneumonia for a long time and completed multiple antibiotic courses recently.  He was quite tachypneic and using accessory muscles.  Wife at bedside. ?Patient and wife has a lot of questions regarding disease progression, there is chronic pneumonia and asking about further possibilities of treatment versus comfort only-message was sent to Dr. Grayland Ormond to answer these concerns. ?Sputum culture was also ordered. ?Vancomycin was discontinued due to slight increase in creatinine and MRSA swab being negative. ?Sodium increased to 129 from 123 after getting some IV fluid. ?Procalcitonin at 0.21.  Magnesium of 1.4 today-IV magnesium replacement ordered. ?Blood cultures remain negative in 24 hours. ? ? ?Assessment and Plan: ?* Shortness of breath ?- Met sepsis criteria with elevated respiration rate, heart rate, fever, with suspected source of pneumonia ?- Etiology work-up in progress, given fever, infectious etiology/pneumonia is the primary differential ?- Check procalcitonin-0.21 ?- Blood cultures x2 remain negative so far. ?- Patient was placed on cefepime, Zithromax and vancomycin. ?MRSA swab negative-discontinue vancomycin. ?-Continue with cefepime and Zithromax. ?-ID and pulmonology consult ? ?HCAP (healthcare-associated pneumonia) ?Please see above. ?Also concern of MAC ?-Ordered sputum culture ? ?Non-small cell lung cancer, right ([place]) ?There is a disease progression concern on CT chest. ?Oncology is aware and will talk with patient as he has a lot of questions regarding moving forward. ?- Continue follow-up with outpatient medical oncology ?- Stop smoking ? ?SIADH (syndrome of inappropriate ADH production) ([place]) ?Patient has chronic hyponatremia with  baseline sodium appears to be around  128-130, most likely has SIADH secondary to lung cancer. ?He was on salt tablets at home which he will continue. ? ?Hyponatremia ?- Serum sodium within the last month had the range of 123-130 ?- Serum sodium on presentation is 123>>129 ?- Presumed secondary to SIADH ?- Sodium chloride 1 L bolus ordered, with maintenance IVF with sodium chloride for sepsis as above ?- Continue to monitor ? ?COPD (chronic obstructive pulmonary disease) ([place]) ?- Does not appear to be in acute exacerbation at this time ?- Resumed home maintenance inhaler and rescue inhalers ? ?Obstructive sleep apnea ?- Patient states that he does not wear his CPAP machine at home ?- CPAP machine inpatient was offered, patient declined ? ?Benign essential hypertension ?Blood pressure mildly elevated.  Patient was on midodrine. ?-Hold midodrine ?- Hydralazine 100 mg p.o. every 6 hours as needed for SBP greater than 170,  ? ?Chronic coronary artery disease ?- Nitroglycerin sublingual 0.4 mg every 5 hours as needed for chest pain ?- Atorvastatin 40 mg nightly ? ?Cigarette smoker ?- Nicotine patch as needed for nicotine craving ordered ? ?Mixed hyperlipidemia ?- Atorvastatin 40 mg nightly resumed ? ?Anxiety ?- Resumed home diazepam 5 mg p.o. nightly as needed for anxiety and sleep ? ?Hypothyroidism, acquired ?- Resume home levothyroxine 150 mcg daily ? ?Musculoskeletal chest pain ?- Right anterior to right lateral chest, at the approximate level of T8-T12 ?- Patient states the pain started after coughing onset, about 2 weeks ago and is persistent ?- Reproducible with moderate strength palpation ?- Lidocaine patch ordered ? ?Diarrhea ? Resolved.  No bowel movement since coming to the hospital, see if labs and GI pathogen panel was ordered on admission. ?Discontinue C. difficile and GI pathogen panel ? ? ?  ?Subjective: Patient continued to feel short of breath.  Worsening cough and shortness of breath is going on for  some time.  He seems tachypneic.  Wife at bedside.  Both husband and 5 had a lot of questions regarding disease progression and scope of treatment which I defer to his oncologist. ? ? ?Physical Exam: ?Vitals:  ? 02/04/22 0103 02/04/22 0425 02/04/22 0831 02/04/22 1125  ?BP: 115/70 126/75 (!) 143/74 (!) 142/68  ?Pulse: 98 94 95 (!) 103  ?Resp:  (!) _0 ?Temp: 98.8 ?F (37.1 ?C) 98.3 ?F (36.8 ?C) 97.7 ?F (36.5 ?C) 98.2 ?F (36.8 ?C)  ?TempSrc: Oral  Oral Oral  ?SpO2: 97% 97% 96% 97%  ? ?General.  Chronically ill-appearing gentleman, tachypneic but able to speak full sentences. ?Pulmonary.  Scant bilateral scattered wheeze, mildly increased work of breathing. ?CV.  Regular rate and rhythm, no JVD, rub or murmur. ?Abdomen.  Soft, nontender, nondistended, BS positive. ?CNS.  Alert and oriented x3.  No focal neurologic deficit. ?Extremities.  No edema, no cyanosis, pulses intact and symmetrical. ?Psychiatry.  Judgment and insight appears normal. ? ?Data Reviewed: ?Prior notes, labs and images reviewed ? ?Family Communication: Discussed with wife at bedside. ? ?Disposition: ?Status is: Inpatient ?Remains inpatient appropriate because: Severity of illness ? ? Planned Discharge Destination: To be determined ? ?DVT prophylaxis.  Lovenox ? ?Time spent: 50 minutes ? ?This record has been created using Systems analyst. Errors have been sought and corrected,but may not always be located. Such creation errors do not reflect on the standard of care. ? ?Author: ?Lorella Nimrod, MD ?02/04/2022 2:29 PM ? ?For on call review [email or web address].  ?

## 2022-02-04 NOTE — Consult Note (Signed)
?[place]  ?[phone] [phone] ? ?ID: [name] OB: 1956/01/19  MR#: [id number]  XLK#:[id number] ? ?Patient Care Team: ?[name], MD as PCP - General (Internal Medicine) ?Algernon Huxley, MD as Referring Physician (Vascular Surgery) ?[name], MD as Consulting Physician (Oncology) ?[name], MD as Referring Physician (Radiation Oncology) ? ?CHIEF COMPLAINT: Stage IV lung cancer, acute on chronic respiratory failure, sepsis like syndrome. ? ?INTERVAL HISTORY: Patient is a 66 year old male who received his fourth cycle of chemotherapy using Taxotere approximately 1 week ago initially presented to the clinic with worsening shortness of breath, increasing weakness and fatigue, and fever.  Patient was transferred to the ER and subsequently admitted.  He continues to have increased shortness of breath and cough, but states has improved since admission.  He does not report any further fevers.  He has no neurologic complaints.  He continues to have significant chest/flank pain.  He denies any hemoptysis.  He has no nausea, vomiting, constipation, or diarrhea.  He has no urinary complaints.  Patient feels generally terrible, but offers no further specific complaints today. ? ?REVIEW OF SYSTEMS:   ?Review of Systems  ?Constitutional:  Positive for fever and malaise/fatigue. Negative for weight loss.  ?Respiratory:  Positive for cough and shortness of breath. Negative for hemoptysis.   ?Cardiovascular:  Positive for chest pain. Negative for leg swelling.  ?Gastrointestinal: Negative.  Negative for abdominal pain.  ?Genitourinary:  Positive for flank pain. Negative for dysuria.  ?Musculoskeletal:  Negative for back pain.  ?Skin: Negative.  Negative for rash.  ?Neurological:  Positive for weakness. Negative for dizziness, focal weakness and headaches.  ?Psychiatric/Behavioral: Negative.  The patient is not nervous/anxious.   ? ?As per HPI. Otherwise, a  complete review of systems is negative. ? ?PAST MEDICAL HISTORY: ?Past Medical History:  ?Diagnosis Date  ? Anginal pain ([place])   ? Anxiety   ? Asthma   ? Chest pain   ? CHF (congestive heart failure) ([place])   ? Chicken pox   ? Complication of anesthesia   ? o2 dropped after neck fusion  ? COPD (chronic obstructive pulmonary disease) ([place])   ? Coronary artery disease   ? Cough   ? chronic  clear phlegm  ? Dysrhythmia   ? palpitations  ? GERD (gastroesophageal reflux disease)   ? h/o reflux/ hoarsness  ? Hematochezia   ? Hemorrhoids   ? History of chickenpox   ? History of colon polyps   ? History of Helicobacter pylori infection   ? Hoarseness   ? Hypertension   ? Lung cancer ([place]) 05/2016  ? Chemo + rad tx's.   ? Migraines   ? OSA (obstructive sleep apnea)   ? has CPAP but does not use  ? Personal history of tobacco use, presenting hazards to health 03/05/2016  ? Pneumonia   ? 5/17  ? Raynaud disease   ? Raynaud disease   ? Raynaud's disease   ? Rotator cuff tear   ? on right  ? Shortness of breath dyspnea   ? Sleep apnea   ? Ulcer (traumatic) of oral mucosa   ? ? ?PAST SURGICAL HISTORY: ?Past Surgical History:  ?Procedure Laterality Date  ? BACK SURGERY    ? cervical fusion x 2  ? CARDIAC CATHETERIZATION    ? CERVICAL DISCECTOMY    ? x 2  ? COLONOSCOPY    ? COLONOSCOPY N/A 07/25/2015  ? Procedure: COLONOSCOPY;  Surgeon: Lollie Sails, MD;  Location: [facility] ENDOSCOPY;  Service: Endoscopy;  Laterality: N/A;  ? COLONOSCOPY WITH PROPOFOL N/A 10/04/2017  ? Procedure: COLONOSCOPY WITH PROPOFOL;  Surgeon: Lollie Sails, MD;  Location: [facility] ENDOSCOPY;  Service: Endoscopy;  Laterality: N/A;  ? COLONOSCOPY WITH PROPOFOL N/A 07/10/2020  ? Procedure: COLONOSCOPY WITH PROPOFOL;  Surgeon: Toledo, Benay Pike, MD;  Location: [facility] ENDOSCOPY;  Service: Gastroenterology;  Laterality: N/A;  ? ELECTROMAGNETIC NAVIGATION BROCHOSCOPY Left 06/28/2016  ? Procedure: ELECTROMAGNETIC NAVIGATION BRONCHOSCOPY;  Surgeon: Vilinda Boehringer, MD;   Location: [facility] ORS;  Service: Cardiopulmonary;  Laterality: Left;  ? ENDOBRONCHIAL ULTRASOUND N/A 04/11/2018  ? Procedure: ENDOBRONCHIAL ULTRASOUND;  Surgeon: [name], MD;  Location: [facility] ORS;  Service: Cardiopulmonary;  Laterality: N/A;  ? ESOPHAGOGASTRODUODENOSCOPY N/A 07/25/2015  ? Procedure: ESOPHAGOGASTRODUODENOSCOPY (EGD);  Surgeon: Lollie Sails, MD;  Location: [facility] ENDOSCOPY;  Service: Endoscopy;  Laterality: N/A;  ? ESOPHAGOGASTRODUODENOSCOPY (EGD) WITH PROPOFOL N/A 07/10/2020  ? Procedure: ESOPHAGOGASTRODUODENOSCOPY (EGD) WITH PROPOFOL;  Surgeon: Toledo, Benay Pike, MD;  Location: [facility] ENDOSCOPY;  Service: Gastroenterology;  Laterality: N/A;  ? NASAL SINUS SURGERY    ? x 2   ? PORTA CATH INSERTION N/A 04/24/2018  ? Procedure: PORTA CATH INSERTION;  Surgeon: Algernon Huxley, MD;  Location: [place] CV LAB;  Service: Cardiovascular;  Laterality: N/A;  ? rotator cuff surgery Right   ? 07/2016  ? SEPTOPLASTY    ? SKIN GRAFT    ? ? ?FAMILY HISTORY: ?Family History  ?Problem Relation Age of Onset  ? Heart disease Father   ? Prostate cancer Father   ? Heart disease Paternal Grandmother   ? Heart attack Maternal Grandfather 52  ? Kidney cancer Neg Hx   ? Bladder Cancer Neg Hx   ? Other Neg Hx   ?     pituitary abnormality  ? ? ?ADVANCED DIRECTIVES (Y/N):  @ADVDIR @ ? ?HEALTH MAINTENANCE: ?Social History  ? ?Tobacco Use  ? Smoking status: Every Day  ?  Packs/day: 3.00  ?  Years: 50.00  ?  Pack years: 150.00  ?  Types: Cigarettes  ? Smokeless tobacco: Never  ? Tobacco comments:  ?  1.5-2 PPD 11/19/2021  ?Vaping Use  ? Vaping Use: Never used  ?Substance Use Topics  ? Alcohol use: Yes  ?  Alcohol/week: 2.0 standard drinks  ?  Types: 2 Standard drinks or equivalent per week  ?  Comment: moderate  ? Drug use: No  ? ? ? Colonoscopy: ? PAP: ? Bone density: ? Lipid panel: ? ?Allergies  ?Allergen Reactions  ? Lisinopril Rash  ? Varenicline Rash  ? ? ?Current Facility-Administered Medications  ?Medication Dose Route  Frequency Provider Last Rate Last Admin  ? acetaminophen (TYLENOL) tablet 650 mg  650 mg Oral Q6H PRN Cox, Amy N, DO   650 mg at 02/04/22 1103  ? Or  ? acetaminophen (TYLENOL) suppository 650 mg  650 mg Rectal Q6H PRN Cox, Amy N, DO      ? albuterol (PROVENTIL) (2.5 MG/3ML) 0.083% nebulizer solution 3 mL  3 mL Nebulization Q4H PRN Cox, Amy N, DO      ? atorvastatin (LIPITOR) tablet 40 mg  40 mg Oral QHS Cox, Amy N, DO   40 mg at 02/03/22 2257  ? azithromycin (ZITHROMAX) 500 mg in sodium chloride 0.9 % 250 mL IVPB  500 mg Intravenous Q24H Cox, Amy N, DO   Stopped at 02/04/22 0023  ? budesonide (PULMICORT) nebulizer solution 0.5 mg  0.5 mg Nebulization TID PRN Cox, Amy N,  DO      ? ceFEPIme (MAXIPIME) 2 g in sodium chloride 0.9 % 100 mL IVPB  2 g Intravenous Q8H Cox, Amy N, DO 200 mL/hr at 02/04/22 1608 2 g at 02/04/22 1608  ? Chlorhexidine Gluconate Cloth 2 % PADS 6 each  6 each Topical Q0600 Lorella Nimrod, MD   6 each at 02/04/22 [phone]  ? chlorpheniramine-HYDROcodone 10-8 MG/5ML suspension 5 mL  5 mL Oral Q12H PRN Cox, Amy N, DO   5 mL at 02/04/22 1617  ? dextromethorphan-guaiFENesin (MUCINEX DM) 30-600 MG per 12 hr tablet 1 tablet  1 tablet Oral BID Lorella Nimrod, MD   1 tablet at 02/04/22 0940  ? diazepam (VALIUM) tablet 5 mg  5 mg Oral QHS PRN Cox, Amy N, DO   5 mg at 02/03/22 2324  ? enoxaparin (LOVENOX) injection 40 mg  40 mg Subcutaneous Q24H Cox, Amy N, DO   40 mg at 02/03/22 2256  ? fluticasone furoate-vilanterol (BREO ELLIPTA) 200-25 MCG/ACT 1 puff  1 puff Inhalation Daily Cox, Amy N, DO   1 puff at 02/04/22 0826  ? And  ? umeclidinium bromide (INCRUSE ELLIPTA) 62.5 MCG/ACT 1 puff  1 puff Inhalation Daily Cox, Amy N, DO   1 puff at 02/04/22 0825  ? furosemide (LASIX) injection 40 mg  40 mg Intravenous Daily Ottie Glazier, MD      ? hydrALAZINE (APRESOLINE) tablet 10 mg  10 mg Oral Q6H PRN Cox, Amy N, DO      ? ipratropium-albuterol (DUONEB) 0.5-2.5 (3) MG/3ML nebulizer solution 3 mL  3 mL Nebulization Q6H  Lorella Nimrod, MD   3 mL at 02/04/22 2036  ? levothyroxine (SYNTHROID) tablet 150 mcg  150 mcg Oral Q0600 Cox, Amy N, DO   150 mcg at 02/04/22 0829  ? lidocaine (LIDODERM) 5 % 1 patch  1 patch Transdermal Q24H C

## 2022-02-04 NOTE — Progress Notes (Addendum)
Nursing message via secure chat that patient is having 9 out of 10 chest pain not relieved with lidocaine patch or Toradol IV. ? ?I evaluated patient at bedside he is awake alert and oriented to self, age, current calendar year.  He does not appear to be in acute distress. ? ?On physical exam there was no rash in the band area where he is endorsing pain.  Lidocaine patch is in place.  He reports the pain is a stinging sensation, and is a 9 out of 10, and he has never had this pain before. ? ?He specifically denies pressured pain or current shortness of breath. ? ?The pain is reproducible with palpation. ? ?# Right-sided chest pain-query musculoskeletal versus progression of metastatic disease ?- Morphine 2 mg IV every 4 hours as needed for severe pain ordered, 2 doses ordered ?- Troponin x2 ordered, low clinical suspicion for ACS at this time, however given history of CAD and current tobacco use, troponin has been ordered ?- Discussed with cross coverage provider and nursing staff ? ?Dr. Tobie Poet ?

## 2022-02-05 ENCOUNTER — Telehealth: Payer: Self-pay | Admitting: Internal Medicine

## 2022-02-05 LAB — CBC
HCT: 32.4 % — ABNORMAL LOW (ref 39.0–52.0)
Hemoglobin: 10.4 g/dL — ABNORMAL LOW (ref 13.0–17.0)
MCH: 30.4 pg (ref 26.0–34.0)
MCHC: 32.1 g/dL (ref 30.0–36.0)
MCV: 94.7 fL (ref 80.0–100.0)
Platelets: 331 10*3/uL (ref 150–400)
RBC: 3.42 MIL/uL — ABNORMAL LOW (ref 4.22–5.81)
RDW: 16.3 % — ABNORMAL HIGH (ref 11.5–15.5)
WBC: 15.8 10*3/uL — ABNORMAL HIGH (ref 4.0–10.5)
nRBC: 0 % (ref 0.0–0.2)

## 2022-02-05 LAB — RESPIRATORY PANEL BY PCR

## 2022-02-05 LAB — BASIC METABOLIC PANEL
Anion gap: 11 (ref 5–15)
BUN: 8 mg/dL (ref 8–23)
CO2: 26 mmol/L (ref 22–32)
Calcium: 8.3 mg/dL — ABNORMAL LOW (ref 8.9–10.3)
Chloride: 92 mmol/L — ABNORMAL LOW (ref 98–111)
Creatinine, Ser: 0.72 mg/dL (ref 0.61–1.24)
GFR, Estimated: >60 mL/min (ref >60)
Glucose, Bld: 93 mg/dL (ref 70–99)
Potassium: 4 mmol/L (ref 3.5–5.1)
Sodium: 129 mmol/L — ABNORMAL LOW (ref 135–145)

## 2022-02-05 LAB — C-REACTIVE PROTEIN: CRP: 15.5 mg/dL — ABNORMAL HIGH (ref <1.0)

## 2022-02-05 LAB — CRYPTOCOCCAL ANTIGEN: Crypto Ag: NEGATIVE

## 2022-02-05 MED ORDER — HYDROCODONE-ACETAMINOPHEN 5-325 MG PO TABS: 1.0000 | ORAL_TABLET | ORAL | 0 refills | Status: DC | PRN

## 2022-02-05 MED ORDER — AZITHROMYCIN 500 MG PO TABS: 500.0000 mg | ORAL_TABLET | Freq: Every day | ORAL | Status: DC

## 2022-02-05 MED ORDER — HEPARIN SOD (PORK) LOCK FLUSH 100 UNIT/ML IV SOLN
500.0000 [IU] | Freq: Once | INTRAVENOUS | Status: AC
  Administered 2022-02-05: 500 [IU] via INTRAVENOUS
  Filled 2022-02-05: qty 5

## 2022-02-05 MED ORDER — AMOXICILLIN-POT CLAVULANATE 875-125 MG PO TABS
1.0000 | ORAL_TABLET | Freq: Two times a day (BID) | ORAL | Status: DC
  Administered 2022-02-05: 1 via ORAL
  Filled 2022-02-05: qty 1

## 2022-02-05 MED ORDER — AZITHROMYCIN 500 MG PO TABS: 500.0000 mg | ORAL_TABLET | Freq: Every day | ORAL | Status: AC

## 2022-02-05 MED ORDER — LIDOCAINE 5 % EX PTCH: 1.0000 | MEDICATED_PATCH | CUTANEOUS | 0 refills | Status: DC

## 2022-02-05 MED ORDER — AMOXICILLIN-POT CLAVULANATE 875-125 MG PO TABS
1.0000 | ORAL_TABLET | Freq: Two times a day (BID) | ORAL | 0 refills | Status: AC
Start: 2022-02-05 — End: 2022-02-12

## 2022-02-05 MED ORDER — SODIUM CHLORIDE 0.9% FLUSH
10.0000 mL | Freq: Once | INTRAVENOUS | Status: AC
  Administered 2022-02-05: 10 mL via INTRAVENOUS

## 2022-02-05 NOTE — Assessment & Plan Note (Addendum)
-  Met sepsis criteria with elevated respiration rate, heart rate, fever, with suspected source of pneumonia ?- Etiology work-up in progress, given fever, infectious etiology/pneumonia is the primary differential ?- Check procalcitonin-0.21 ?- Blood cultures x2 remain negative so far. ?- Patient was placed on cefepime, Zithromax and vancomycin. ?MRSA swab negative-vancomycin was discontinued. ?-Continue with cefepime and Zithromax. ?-ID and pulmonology consult ?

## 2022-02-05 NOTE — Assessment & Plan Note (Signed)
H/o non small cell lung cancer diagnosed in 2019 and took XRT, chemo and immune check point inhibitor- then in 2021 there was a concern for worsening CT hcest and he got radiation. Then fell and fractured his cervical spine- as part of that work up Scans were done and they showed worsening cavitary mass rt lung and some nodules left lung. Started chemo again and took the 4th dose of taxotere with decadron on 3/30 and Pegfilastrim on 3/31. ?There is a disease progression concern on CT chest. ?Oncology saw him and they are recommending PET scan which is scheduled in 2 weeks to determine whether this is a real disease progression or related to underlying infection. ?- Continue follow-up with outpatient medical oncology ?- Stop smoking-continue to smoke heavily. ?

## 2022-02-05 NOTE — TOC Progression Note (Signed)
Transition of Care (TOC) - Progression Note  ? ? ?Patient Details  ?Name: Paul Carpenter ?MRN: [id number] ?Date of Birth: [birth year] ? ?Transition of Care (TOC) CM/SW Contact  ?Pete Pelt, RN ?Phone Number: ?02/05/2022, 1:14 PM ? ?Clinical Narrative:   Hospitalist was unsure if we should order oxygen to transport patient home.  Staff performed oxygen saturations and reported that patient does not qualify.  ? ?For future oxygen needs patient's oxygen supplier is [employer] ? ? ? ?  ?  ? ?Expected Discharge Plan and Services ?  ?  ?  ?  ?  ?Expected Discharge Date: 02/05/22               ?  ?  ?  ?  ?  ?  ?  ?  ?  ?  ? ? ?Social Determinants of Health (SDOH) Interventions ?  ? ?Readmission Risk Interventions ?   ? View : No data to display.  ?  ?  ?  ? ? ?

## 2022-02-05 NOTE — Assessment & Plan Note (Signed)
-   Serum sodium within the last month had the range of 123-130 ?- Serum sodium on presentation is 123>>129>>129 ?- Presumed secondary to SIADH ?- Also received IV fluid due to concern of sepsis which has been discontinued now. ?- Continue to monitor ?-Continue with salt tablet ?

## 2022-02-05 NOTE — Assessment & Plan Note (Signed)
Blood pressure within goal, patient was on midodrine which was placed on hold due to elevated blood pressure. ?-Continue holding  midodrine ?- Hydralazine 100 mg p.o. every 6 hours as needed for SBP greater than 170,  ?

## 2022-02-05 NOTE — Assessment & Plan Note (Signed)
-   Patient states that he does not wear his CPAP machine at home ?- CPAP machine inpatient was offered, patient declined ?

## 2022-02-05 NOTE — Progress Notes (Signed)
Discharge instructions reviewed with patient including followup visits and new medications.  Understanding was verbalized and all questions were answered.  IV removed without complication; patient tolerated well.  Patient discharged home via wheelchair in stable condition escorted by nursing staff. ? ?

## 2022-02-05 NOTE — Progress Notes (Signed)
? ? ? ?PULMONOLOGY ? ? ? ? ? ? ? ? ?Date: 02/05/2022,   ?MRN# [id number] Paul Carpenter July 17, 1956 ? ? ?  ?AdmissionWeight: 91.2 kg                 ?CurrentWeight: 91.2 kg ? ?Referring provider: Dr Reesa Chew ? ? ?CHIEF COMPLAINT:  ? ?Acute on chronic hypoxemic respiratory failure ? ? ?HISTORY OF PRESENT ILLNESS  ? ?This is a patient with complex medical history as below with significant pulmonary history including OSA , asthma, COPD, Lung cancer s/p chemoradiation therapy with chronic dyspnea and hypoxemia. He came in with febrile illness.Serum sodium 123, potassium 4.1, chloride 87, bicarb 28, BUN of 13, serum creatinine of 0.67, GFR greater than 60, nonfasting blood glucose 119, WBC 8.9, hemoglobin 10.5, platelets of 300. CTPE done with findigs of  progressive multi cavitary lesion occupying good portion of the right upper lobe, highly suspicious for progressive neoplasm.  Enlarging right infrahilar and mediastinal lymph nodes.  Several small solid pulmonary nodules highly suspicious for metastatic disease.  Patchy nodular airspace disease with surrounding tree-in-bud appearance most likely superimposed atypical infectious process such as MAC.  Soft tissue nodularity in the right middle lobe and right lower lobe has progressed and this could be progressive infection or progressive tumor.  Severe underlying emphysematous changes.  Stable atherosclerotic calcification involving the thoracic aorta and branch vessels including the coronary arteries. ? ?Despite severe comorbidities he continues to smoke daily. Due to abnormal chest imaging with febrile illness PCCM consultation placed for further evaluation and management.  ? ?02/05/22- patient is stable for dc home. Infectious workup negative thus far but CRP is markedly elevated.  He is normoxic on room air while resting , ive asked RN to perform exertional walk to delineate need for supplemental O2 upon dc.  He has actually been seen by Dr [name] in the past and I recommend  to follow up with his previous pulmonologist for continuity of care. Ive discussed imaging, labwork and medical plan with patient, family and RN as well as hospital attending physician.  ? ?PAST MEDICAL HISTORY  ? ?Past Medical History:  ?Diagnosis Date  ? Anginal pain ([place])   ? Anxiety   ? Asthma   ? Chest pain   ? CHF (congestive heart failure) ([place])   ? Chicken pox   ? Complication of anesthesia   ? o2 dropped after neck fusion  ? COPD (chronic obstructive pulmonary disease) ([place])   ? Coronary artery disease   ? Cough   ? chronic  clear phlegm  ? Dysrhythmia   ? palpitations  ? GERD (gastroesophageal reflux disease)   ? h/o reflux/ hoarsness  ? Hematochezia   ? Hemorrhoids   ? History of chickenpox   ? History of colon polyps   ? History of Helicobacter pylori infection   ? Hoarseness   ? Hypertension   ? Lung cancer ([place]) 05/2016  ? Chemo + rad tx's.   ? Migraines   ? OSA (obstructive sleep apnea)   ? has CPAP but does not use  ? Personal history of tobacco use, presenting hazards to health 03/05/2016  ? Pneumonia   ? 5/17  ? Raynaud disease   ? Raynaud disease   ? Raynaud's disease   ? Rotator cuff tear   ? on right  ? Shortness of breath dyspnea   ? Sleep apnea   ? Ulcer (traumatic) of oral mucosa   ? ? ? ?SURGICAL HISTORY  ? ?Past  Surgical History:  ?Procedure Laterality Date  ? BACK SURGERY    ? cervical fusion x 2  ? CARDIAC CATHETERIZATION    ? CERVICAL DISCECTOMY    ? x 2  ? COLONOSCOPY    ? COLONOSCOPY N/A 07/25/2015  ? Procedure: COLONOSCOPY;  Surgeon: Lollie Sails, MD;  Location: [facility] ENDOSCOPY;  Service: Endoscopy;  Laterality: N/A;  ? COLONOSCOPY WITH PROPOFOL N/A 10/04/2017  ? Procedure: COLONOSCOPY WITH PROPOFOL;  Surgeon: Lollie Sails, MD;  Location: [facility] ENDOSCOPY;  Service: Endoscopy;  Laterality: N/A;  ? COLONOSCOPY WITH PROPOFOL N/A 07/10/2020  ? Procedure: COLONOSCOPY WITH PROPOFOL;  Surgeon: Toledo, Benay Pike, MD;  Location: [facility] ENDOSCOPY;  Service: Gastroenterology;  Laterality: N/A;   ? ELECTROMAGNETIC NAVIGATION BROCHOSCOPY Left 06/28/2016  ? Procedure: ELECTROMAGNETIC NAVIGATION BRONCHOSCOPY;  Surgeon: Vilinda Boehringer, MD;  Location: [facility] ORS;  Service: Cardiopulmonary;  Laterality: Left;  ? ENDOBRONCHIAL ULTRASOUND N/A 04/11/2018  ? Procedure: ENDOBRONCHIAL ULTRASOUND;  Surgeon: [name], MD;  Location: [facility] ORS;  Service: Cardiopulmonary;  Laterality: N/A;  ? ESOPHAGOGASTRODUODENOSCOPY N/A 07/25/2015  ? Procedure: ESOPHAGOGASTRODUODENOSCOPY (EGD);  Surgeon: Lollie Sails, MD;  Location: [facility] ENDOSCOPY;  Service: Endoscopy;  Laterality: N/A;  ? ESOPHAGOGASTRODUODENOSCOPY (EGD) WITH PROPOFOL N/A 07/10/2020  ? Procedure: ESOPHAGOGASTRODUODENOSCOPY (EGD) WITH PROPOFOL;  Surgeon: Toledo, Benay Pike, MD;  Location: [facility] ENDOSCOPY;  Service: Gastroenterology;  Laterality: N/A;  ? NASAL SINUS SURGERY    ? x 2   ? PORTA CATH INSERTION N/A 04/24/2018  ? Procedure: PORTA CATH INSERTION;  Surgeon: Algernon Huxley, MD;  Location: [place] CV LAB;  Service: Cardiovascular;  Laterality: N/A;  ? rotator cuff surgery Right   ? 07/2016  ? SEPTOPLASTY    ? SKIN GRAFT    ? ? ? ?FAMILY HISTORY  ? ?Family History  ?Problem Relation Age of Onset  ? Heart disease Father   ? Prostate cancer Father   ? Heart disease Paternal Grandmother   ? Heart attack Maternal Grandfather 52  ? Kidney cancer Neg Hx   ? Bladder Cancer Neg Hx   ? Other Neg Hx   ?     pituitary abnormality  ? ? ? ?SOCIAL HISTORY  ? ?Social History  ? ?Tobacco Use  ? Smoking status: Every Day  ?  Packs/day: 3.00  ?  Years: 50.00  ?  Pack years: 150.00  ?  Types: Cigarettes  ? Smokeless tobacco: Never  ? Tobacco comments:  ?  1.5-2 PPD 11/19/2021  ?Vaping Use  ? Vaping Use: Never used  ?Substance Use Topics  ? Alcohol use: Yes  ?  Alcohol/week: 2.0 standard drinks  ?  Types: 2 Standard drinks or equivalent per week  ?  Comment: moderate  ? Drug use: No  ? ? ? ?MEDICATIONS  ? ? ?Home Medication:  ?  ?Current Medication: ? ?Current Facility-Administered  Medications:  ?  acetaminophen (TYLENOL) tablet 650 mg, 650 mg, Oral, Q6H PRN, 650 mg at 02/04/22 1103 **OR** acetaminophen (TYLENOL) suppository 650 mg, 650 mg, Rectal, Q6H PRN, Cox, Amy N, DO ?  albuterol (PROVENTIL) (2.5 MG/3ML) 0.083% nebulizer solution 3 mL, 3 mL, Nebulization, Q4H PRN, Cox, Amy N, DO ?  amoxicillin-clavulanate (AUGMENTIN) 875-125 MG per tablet 1 tablet, 1 tablet, Oral, Q12H, Lorella Nimrod, MD ?  atorvastatin (LIPITOR) tablet 40 mg, 40 mg, Oral, QHS, Cox, Amy N, DO, 40 mg at 02/04/22 2216 ?  azithromycin (ZITHROMAX) tablet 500 mg, 500 mg, Oral, Daily, Virl Cagey E, RPH ?  budesonide (PULMICORT) nebulizer  solution 0.5 mg, 0.5 mg, Nebulization, TID PRN, Cox, Amy N, DO ?  Chlorhexidine Gluconate Cloth 2 % PADS 6 each, 6 each, Topical, Q0600, Lorella Nimrod, MD, 6 each at 02/05/22 [phone] ?  chlorpheniramine-HYDROcodone 10-8 MG/5ML suspension 5 mL, 5 mL, Oral, Q12H PRN, Cox, Amy N, DO, 5 mL at 02/04/22 1617 ?  dextromethorphan-guaiFENesin (MUCINEX DM) 30-600 MG per 12 hr tablet 1 tablet, 1 tablet, Oral, BID, Lorella Nimrod, MD, 1 tablet at 02/05/22 0915 ?  diazepam (VALIUM) tablet 5 mg, 5 mg, Oral, QHS PRN, Cox, Amy N, DO, 5 mg at 02/04/22 2217 ?  enoxaparin (LOVENOX) injection 40 mg, 40 mg, Subcutaneous, Q24H, Cox, Amy N, DO, 40 mg at 02/04/22 2219 ?  fluticasone furoate-vilanterol (BREO ELLIPTA) 200-25 MCG/ACT 1 puff, 1 puff, Inhalation, Daily, 1 puff at 02/05/22 0807 **AND** umeclidinium bromide (INCRUSE ELLIPTA) 62.5 MCG/ACT 1 puff, 1 puff, Inhalation, Daily, Cox, Amy N, DO, 1 puff at 02/05/22 0808 ?  furosemide (LASIX) injection 40 mg, 40 mg, Intravenous, Daily, [name], Oluwatamilore Starnes, MD, 40 mg at 02/05/22 0915 ?  heparin lock flush 100 unit/mL, 500 Units, Intravenous, Once, Lorella Nimrod, MD ?  hydrALAZINE (APRESOLINE) tablet 10 mg, 10 mg, Oral, Q6H PRN, Cox, Amy N, DO ?  ipratropium-albuterol (DUONEB) 0.5-2.5 (3) MG/3ML nebulizer solution 3 mL, 3 mL, Nebulization, Q6H, Lorella Nimrod, MD, 3 mL at  02/05/22 0707 ?  levothyroxine (SYNTHROID) tablet 150 mcg, 150 mcg, Oral, Q0600, Cox, Amy N, DO, 150 mcg at 02/05/22 0626 ?  lidocaine (LIDODERM) 5 % 1 patch, 1 patch, Transdermal, Q24H, Cox, Amy N, DO, 1

## 2022-02-05 NOTE — Telephone Encounter (Signed)
Spoke to floor nurse. She would like to schedule 1wk HFU to discuss possible bronch. Hx of lung cancer ?Dr. [name] or NP's have availability in 1 week.  ? ?Dr. Patsey Berthold, please advise if okay to schedule 02/09/2022 at 4:30?  ?

## 2022-02-05 NOTE — Care Management (Signed)
?  Transition of Care (TOC) Screening Note ? ? ?Patient Details  ?Name: Paul Carpenter ?Date of Birth: 1956/03/24 ? ? ?Transition of Care (TOC) CM/SW Contact:    ?Pete Pelt, RN ?Phone Number: ?02/05/2022, 11:23 AM ? ? ? ?Transition of Care Department [facility]) has reviewed patient and no TOC needs have been identified at this time. We will continue to monitor patient advancement through interdisciplinary progression rounds. If new patient transition needs arise, please place a TOC consult. ? ?Patient's spouse cares for him at home, no concerns with home oxygen ?

## 2022-02-05 NOTE — Assessment & Plan Note (Signed)
-   Does not appear to be in acute exacerbation at this time ?- Resumed home maintenance inhaler and rescue inhalers ?

## 2022-02-05 NOTE — Telephone Encounter (Signed)
Appt scheduled 02/09/22 at 4:30.  ? ?Called patient to make him aware of appt. Work of breathing was noted. Patient could not complete a full sentence without stopping to catch his breath.  ? ?I called patient's wife, Robin(DPR). She stated that she had just left to go get his meds, however she was going to have a family member go check on patient. She stated that patient was discharged today and this is his baseline. She stated that patient is currently on 2L. Recommended that she continue to monitor spo2 and present back to ED if sx worsen. ?Shirlean Mylar is aware of recommendation and appt date/time. She voiced her understanding and had no further questions.  ?Nothing further needed. ?

## 2022-02-05 NOTE — Assessment & Plan Note (Signed)
Patient has chronic hyponatremia with baseline sodium appears to be around  128-130, most likely has SIADH secondary to lung cancer. ?He was on salt tablets at home which he will continue. ?

## 2022-02-05 NOTE — Discharge Summary (Addendum)
?Physician Discharge Summary ?  ?Patient: Paul Carpenter MRN: [id number] DOB: 1956/05/11  ?Admit date:     02/03/2022  ?Discharge date: 02/05/22  ?Discharge Physician: Paul Carpenter  ? ?PCP: Paul Harrier, MD  ? ?Recommendations at discharge:  ?Please obtain CBC and BMP in 1 week ?Follow-up with pulmonology early next week, patient needs biopsy and BAL culture to rule out MAC.  He also need a possible biopsy for further determining the cause of his worsening CT chest. ?Patient should continue with her appointment for PET scan which she has already been scheduled. ?Follow-up with oncology. ? ?Discharge Diagnoses: ?Principal Problem: ?  Shortness of breath ?Active Problems: ?  HCAP (healthcare-associated pneumonia) ?  Non-small cell lung cancer, right ([employer]) ?  SIADH (syndrome of inappropriate ADH production) ([place]) ?  Hyponatremia ?  COPD (chronic obstructive pulmonary disease) ([place]) ?  Obstructive sleep apnea ?  Benign essential hypertension ?  Chronic coronary artery disease ?  Cigarette smoker ?  Mixed hyperlipidemia ?  Anxiety ?  Hypothyroidism, acquired ?  Musculoskeletal chest pain ?  Diarrhea ?  Cervical radiculopathy ? ?Resolved Problems: ?  CAD (coronary artery disease) ? ?Hospital Course: ?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 ? ?Tmax at oncology clinic showed temperature of 102.4, in the ED is 98.6, respiration rate elevated at 24, heart rate of 106, blood pressure 142/68, improved to 118/71, SPO2 of 94% on 2 L [place]. ? ?Serum sodium 123, potassium 4.1, chloride 87, bicarb 28, BUN of 13, serum  creatinine of 0.67, GFR greater than 60, nonfasting blood glucose 119, WBC 8.9, hemoglobin 10.5, platelets of 300. ? ?CTA of the chest to assess for PE with and without contrast: Read as progressive multi cavitary lesion occupying good portion of the right upper lobe, highly suspicious for progressive neoplasm.  Enlarging right infrahilar and mediastinal lymph nodes.  Several small solid pulmonary nodules highly suspicious for metastatic disease.  Patchy nodular airspace disease with surrounding tree-in-bud appearance most likely superimposed atypical infectious process such as MAC.  Soft tissue nodularity in the right middle lobe and right lower lobe has progressed and this could be progressive infection or progressive tumor.  Severe underlying emphysematous changes.  Stable atherosclerotic calcification involving the thoracic aorta and branch vessels including the coronary arteries. ? ?ED treatment: Acetaminophen 650 mg p.o. one-time dose, cefepime 2 g IV, LR 1 L bolus, vancomycin 1 g IV. ? ?4/6: Patient is having worsening shortness of breath going on for a while, normally sleeps in a chair as it is difficult for him to stay lying down due to respiratory difficulty.  He continues to smoke. ?Infectious disease and pulmonary was consulted for concern of MAC. ?Per patient he is having pneumonia for a long time and completed multiple antibiotic courses recently.  He was quite tachypneic and using accessory muscles.  Wife at bedside. ?Patient and wife has a lot of questions regarding disease progression, there is chronic pneumonia and asking about further possibilities of treatment versus comfort only-message was sent to Paul Carpenter to answer these concerns. ?Sputum culture was also ordered. ?Vancomycin was discontinued due to slight increase in creatinine and MRSA swab being negative. ?Sodium increased  to 129 from 123 after getting some IV fluid. ?Procalcitonin at 0.21.  Magnesium of 1.4 today-IV magnesium  replacement ordered. ?Blood cultures remain negative in 24 hours. ? ?4/7: Infectious disease, pulmonology and oncology saw him, please see their note for further detail.  Current deterioration shown on CT can be multifactorial which include disease progression versus infection which can be fungal or opportunistic.  Respiratory viral panel negative.  Worsening leukocytosis, remained afebrile.  Respiratory cultures, labs for histoplasma, cryptococcal antigen, strep pneumo, Aspergillus, acid-fast culture and smear and Legionella are still pending.  Patient also need a bronchoscopy with BAL culture and a possible biopsy to determine the exact cause. ? ?Patient was requesting discharge to spend the Easter weekend with the family.  Discussed with pulmonology and infectious disease and they are recommending Augmentin and Zithromax and he need to follow-up with his pulmonologist early next week for further management and possible bronchoscopy.  Patient already had an appointment for a repeat PET scan which he should continue.  He will continue his appointment with his oncologist after the PET scan to determine the further course of management. ? ?We stopped midodrine as blood pressure remained little elevated. ?He also received 2 doses of IV Lasix to keep the lungs little dry. ? ?Patient has very poor prognosis and very high risk for deterioration and death. ? ? ? ?Assessment and Plan: ?* Shortness of breath ?- Met sepsis criteria with elevated respiration rate, heart rate, fever, with suspected source of pneumonia ?- Etiology work-up in progress, given fever, infectious etiology/pneumonia is the primary differential ?- Check procalcitonin-0.21 ?- Blood cultures x2 remain negative so far. ?- Patient was placed on cefepime, Zithromax and vancomycin. ?MRSA swab negative-vancomycin was discontinued. ?-Continue with cefepime and Zithromax. ?-ID and pulmonology consult ? ?HCAP (healthcare-associated pneumonia) ?Please see  above. ?Pretty broad differential at this time including infectious etiology with opportunistic infections and fungal infections.  Respiratory cultures pending. ?Infectious disease and pulmonology on board. ? ?Non-small cell lung cancer, right ([place]) ?H/o non small cell lung cancer diagnosed in 2019 and took XRT, chemo and immune check point inhibitor- then in 2021 there was a concern for worsening CT hcest and he got radiation. Then fell and fractured his cervical spine- as part of that work up Scans were done and they showed worsening cavitary mass rt lung and some nodules left lung. Started chemo again and took the 4th dose of taxotere with decadron on 3/30 and Pegfilastrim on 3/31. ?There is a disease progression concern on CT chest. ?Oncology saw him and they are recommending PET scan which is scheduled in 2 weeks to determine whether this is a real disease progression or related to underlying infection. ?- Continue follow-up with outpatient medical oncology ?- Stop smoking-continue to smoke heavily. ? ?SIADH (syndrome of inappropriate ADH production) ([place]) ?Patient has chronic hyponatremia with baseline sodium appears to be around  128-130, most likely has SIADH secondary to lung cancer. ?He was on salt tablets at home which he will continue. ? ?Hyponatremia ?- Serum sodium within the last month had the range of 123-130 ?- Serum sodium on presentation is 123>>129>>129 ?- Presumed secondary to SIADH ?- Also received IV fluid due to concern of sepsis which has been discontinued now. ?- Continue to monitor ?-Continue with salt tablet ? ?COPD (chronic obstructive pulmonary disease) ([place]) ?- Does not appear to be in acute exacerbation at this time ?- Resumed home maintenance inhaler and rescue inhalers ? ?Obstructive sleep apnea ?- Patient states that he does  not wear his CPAP machine at home ?- CPAP machine inpatient was offered, patient declined ? ?Benign essential hypertension ?Blood pressure within goal, patient  was on midodrine which was placed on hold due to elevated blood pressure. ?-Continue holding  midodrine ?- Hydralazine 100 mg p.o. every 6 hours as needed for SBP greater than 170,  ? ?Chronic coronary artery disease ?- Ni

## 2022-02-05 NOTE — Assessment & Plan Note (Signed)
Please see above. ?Pretty broad differential at this time including infectious etiology with opportunistic infections and fungal infections.  Respiratory cultures pending. ?Infectious disease and pulmonology on board. ?

## 2022-02-06 LAB — FUNGITELL, SERUM: Fungitell Result: <31 pg/mL (ref <80)

## 2022-02-07 LAB — CULTURE, RESPIRATORY W GRAM STAIN

## 2022-02-08 LAB — CULTURE, BLOOD (ROUTINE X 2)
Culture: NO GROWTH
Culture: NO GROWTH
Special Requests: ADEQUATE

## 2022-02-09 ENCOUNTER — Encounter: Payer: Self-pay | Admitting: Pulmonary Disease

## 2022-02-09 ENCOUNTER — Ambulatory Visit: Payer: BC Managed Care – PPO | Admitting: Pulmonary Disease

## 2022-02-09 VITALS — BP 118/68 | HR 99 | Temp 98.2°F | Ht 72.0 in | Wt 200.0 lb

## 2022-02-09 DIAGNOSIS — C3491 Malignant neoplasm of unspecified part of right bronchus or lung: Secondary | ICD-10-CM

## 2022-02-09 DIAGNOSIS — F17210 Nicotine dependence, cigarettes, uncomplicated: Secondary | ICD-10-CM | POA: Diagnosis not present

## 2022-02-09 DIAGNOSIS — J9611 Chronic respiratory failure with hypoxia: Secondary | ICD-10-CM

## 2022-02-09 DIAGNOSIS — J850 Gangrene and necrosis of lung: Secondary | ICD-10-CM

## 2022-02-09 LAB — HISTOPLASMA GAL'MANNAN AG SER: Histoplasma Gal'mannan Ag Ser: <0.5 (ref <0.5)

## 2022-02-09 MED ORDER — CIPROFLOXACIN HCL 750 MG PO TABS: 750.0000 mg | ORAL_TABLET | Freq: Two times a day (BID) | ORAL | 0 refills | Status: AC

## 2022-02-09 MED ORDER — ALBUTEROL SULFATE HFA 108 (90 BASE) MCG/ACT IN AERS: 2.0000 | INHALATION_SPRAY | RESPIRATORY_TRACT | 2 refills | Status: DC | PRN

## 2022-02-09 MED ORDER — ALBUTEROL SULFATE (2.5 MG/3ML) 0.083% IN NEBU: 2.5000 mg | INHALATION_SOLUTION | Freq: Four times a day (QID) | RESPIRATORY_TRACT | 2 refills | Status: DC | PRN

## 2022-02-09 NOTE — Progress Notes (Addendum)
? ?Subjective:  ? ? Patient ID: Paul Carpenter, male    DOB: [birth year], 66 y.o.   MRN: [id number] ?Patient Care Team: ?[name], MD as PCP - General (Internal Medicine) ?Algernon Huxley, MD as Referring Physician (Vascular Surgery) ?[name], MD as Consulting Physician (Oncology) ?[name], MD as Referring Physician (Radiation Oncology) ? ?Chief Complaint  ?Patient presents with  ? New Patient (Initial Visit)  ?  Discuss bronch.  ? ?HPI ?Patient is a 66 year old current smoker (1.5 PPD, 150 PY) who presents for evaluation of a cavitary right upper lobe process in the setting of history of non-small cell carcinoma of the lung, with recent recurrence.  The patient's primary pulmonologist is Dr. [name].  Patient was last seen by Dr. [name] on 19 November 2021 at that time the patient had been noted to have recurrence of stage IIIa non-small cell lung cancer of the right middle lobe.  It was suspicion of progression of disease on his most recent PET/CT of 16 November 2021.  Patient at that time could not undergo procedure such as bronchoscopy due to recent C2 cervical fracture.  He underwent chemotherapy with Taxotere every 3 weeks for 4 cycles.  On the day of April he was admitted for observation due to acute on chronic hypoxemic respiratory failure, at that time patient was having issues with fever up to 102.4 and purulent sputum production.  A CT of the chest at that time was performed to assess for PE.  This showed progressive multi cavitary lesion occupying a good portion of the right upper lobe and enlarging right infrahilar mediastinal lymph nodes and several small solid pulmonary nodules.  There was also patchy nodular airspace disease with surrounding tree-in-bud appearance (this has been present since as far back as 2016) there are also severe underlying emphysematous changes.  Because of these findings MAC was suspected and infectious disease and pulmonary disease consultants were  asked to render opinion.  The patient was seen by Dr. [name] and by Dr. [name].  The patient requested early discharge home and was sent home on Augmentin and azithromycin for suspected community-acquired pneumonia and possible MAC.  Patient was discharged the following day.  He presents today for consideration of bronchoscopy.  He informs me that he is still coughing copious amounts of thick green sputum.  Continues to have issues with malaise and right-sided pleuritic chest pain.  Since his discharge microbiology has been resulted and the patient is growing Pseudomonas aeruginosa. ? ?As noted the patient continues to smoke, he does wear oxygen nocturnally but has been noncompliant with CPAP for obstructive sleep apnea.  He does have nocturnal oxygen desaturations per his wife.  He is on Trelegy Ellipta and DuoNebs as needed.  He does note again that his secretions are very thick and difficult to expectorate.  Patient also is requesting a more portable source of oxygen, he is chronically on 2 L/min via nasal cannula. ? ?He does not endorse any other symptomatology. ? ?Review of Systems ?A 10 point review of systems was performed and it is as noted above otherwise negative. ? ?Past Medical History:  ?Diagnosis Date  ? Anginal pain ([place])   ? Anxiety   ? Asthma   ? Chest pain   ? CHF (congestive heart failure) ([place])   ? Chicken pox   ? Complication of anesthesia   ? o2 dropped after neck fusion  ? COPD (chronic obstructive pulmonary disease) ([place])   ? Coronary artery disease   ?  Cough   ? chronic  clear phlegm  ? Dysrhythmia   ? palpitations  ? GERD (gastroesophageal reflux disease)   ? h/o reflux/ hoarsness  ? Hematochezia   ? Hemorrhoids   ? History of chickenpox   ? History of colon polyps   ? History of Helicobacter pylori infection   ? Hoarseness   ? Hypertension   ? Lung cancer ([place]) 05/2016  ? Chemo + rad tx's.   ? Migraines   ? OSA (obstructive sleep apnea)   ? has CPAP but does not use  ? Personal  history of tobacco use, presenting hazards to health 03/05/2016  ? Pneumonia   ? 5/17  ? Raynaud disease   ? Raynaud disease   ? Raynaud's disease   ? Rotator cuff tear   ? on right  ? Shortness of breath dyspnea   ? Sleep apnea   ? Ulcer (traumatic) of oral mucosa   ? ?Past Surgical History:  ?Procedure Laterality Date  ? BACK SURGERY    ? cervical fusion x 2  ? CARDIAC CATHETERIZATION    ? CERVICAL DISCECTOMY    ? x 2  ? COLONOSCOPY    ? COLONOSCOPY N/A 07/25/2015  ? Procedure: COLONOSCOPY;  Surgeon: Lollie Sails, MD;  [facility] ENDOSCOPY;  Service: Endoscopy;  Laterality: N/A;  ? COLONOSCOPY WITH PROPOFOL N/A 10/04/2017  ? Procedure: COLONOSCOPY WITH PROPOFOL;  Surgeon: Lollie Sails, MD;  Location: [facility] ENDOSCOPY;  Service: Endoscopy;  Laterality: N/A;  ? COLONOSCOPY WITH PROPOFOL N/A 07/10/2020  ? Procedure: COLONOSCOPY WITH PROPOFOL;  Surgeon: Tol[place], Benay Pike, MD;  Location: [facility] ENDOSCOPY;  Service: Gastroenterology;  Laterality: N/A;  ? ELECTROMAGNETIC NAVIGATION BROCHOSCOPY Left 06/28/2016  ? Procedure: ELECTROMAGNETIC NAVIGATION BRONCHOSCOPY;  Surgeon: Vilinda Boehringer, MD;  Location: [facility] ORS;  Service: Cardiopulmonary;  Laterality: Left;  ? ENDOBRONCHIAL ULTRASOUND N/A 04/11/2018  ? Procedure: ENDOBRONCHIAL ULTRASOUND;  Surgeon: [name], MD;  Location: [facility] ORS;  Service: Cardiopulmonary;  Laterality: N/A;  ? ESOPHAGOGASTRODUODENOSCOPY N/A 07/25/2015  ? Procedure: ESOPHAGOGASTRODUODENOSCOPY (EGD);  Surgeon: Lollie Sails, MD;  Location[facility] ENDOSCOPY;  Service: Endoscopy;  Laterality: N/A;  ? ESOPHAGOGASTRODUODENOSCOPY (EGD) WITH PROPOFOL N/A 07/10/2020  ? Procedure: ESOPHAGOGASTRODUODENOSCOPY (EGD) WITH PROPOFOL;  Surgeon: Toledo, Benay Pike, MD;  Location: [facility] ENDOSCOPY;  Service: Gastroenterology;  Laterality: N/A;  ? NASAL SINUS SURGERY    ? x 2   ? PORTA CATH INSERTION N/A 04/24/2018  ? Procedure: PORTA CATH INSERTION;  Surgeon: Algernon Huxley, MD;  Location: [facility];   Service: Cardiovascular;  Laterality: N/A;  ? rotator cuff surgery Right   ? 07/2016  ? SEPTOPLASTY    ? SKIN GRAFT    ? ?Patient Active Problem List  ? Diagnosis Date Noted  ? Shortness of breath 02/03/2022  ? Musculoskeletal chest pain 02/03/2022  ? Diarrhea 02/03/2022  ? SIADH (syndrome of inappropriate ADH production) ([place]) 09/22/2021  ? Overweight (BMI 25.0-29.9) 09/22/2021  ? Hypomagnesemia 09/21/2021  ? Elevated hemoglobin A1c 06/19/2020  ? Hypothyroidism, acquired 05/30/2019  ? Squamous cell lung cancer, right ([place]) 05/30/2019  ? HFrEF (heart failure with reduced ejection fraction) ([place]) 03/06/2019  ? Atherosclerosis 12/14/2018  ? Non-small cell lung cancer, right ([place]) 04/24/2018  ? Oral ulcer 02/16/2018  ? 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 Migraine headache 03/07/2017  ? HCAP (healthcare-associated pneumonia) 09/11/2016  ? COPD exacerbation ([place]) 09/11/2016  ? Hyponatremia 09/11/2016  ? Leukocytosis 09/11/2016  ? Thrombocytopenia ([place]) 09/11/2016  ? Sepsis ([place]) 09/09/2016  ?  Cigarette smoker 06/11/2016  ? Cervical radiculopathy 04/15/2016  ? Cervical disc disorder at C5-C6 level with radiculopathy 03/09/2016  ? Impingement syndrome of right shoulder 03/09/2016  ? Personal history of tobacco use, presenting hazards to health 03/05/2016  ? Health care maintenance 09/29/2015  ? 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  ? Benign essential hypertension 05/28/2015  ? Polycythemia 03/24/2015  ? Carotid artery disease ([place]) 12/12/2014  ? Disequilibrium 12/12/2014  ? Mixed hyperlipidemia 12/10/2014  ? 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 headache (migraine equivalents) 08/12/2014  ? Incomplete emptying of bladder 06/04/2014  ? Anxiety 05/18/2014  ? Chronic coronary artery disease 05/18/2014  ? Chronic headaches 05/18/2014  ? Acute shoulder pain 03/15/2014  ? Impingement syndrome of left shoulder 03/15/2014  ? Lung mass 12/06/2013  ? Kidney stone 11/24/2013  ? COPD (chronic obstructive pulmonary disease) ([place]) 04/24/2013  ? Tobacco use  disorder 04/24/2013  ? Obstructive sleep apnea 04/24/2013  ? Benign localized prostatic hyperplasia with lower urinary tract symptoms (LUTS) 07/04/2012  ? Encounter for long-term current use of medication 07/04/2012

## 2022-02-09 NOTE — Patient Instructions (Addendum)
DO NOT TAKE: ?Augmentin (amoxicillin/clavulanate) ?Azithromycin ?DuoNeb (ipratropium/albuterol) ? ?YOUR NEW ANTIBIOTIC IS: ?Ciprofloxacin (Cipro) 750 mg twice a day you will need at least 14 days of therapy ? ? ?The infection you have is due to Pseudomonas, this can cause lung necrosis i.e. destruction.  Cipro will cover for Pseudomonas and can also cover for MAC. ? ?We will await the PET/CT on the 26th and determine when to schedule for potential bronchoscopy after re evaluating that. ? ?We will see you in follow-up in 3 to 4 weeks time with our nurse practitioner and 2 months with me. ? ?QUIT SMOKING!! ?

## 2022-02-10 ENCOUNTER — Encounter: Payer: Self-pay | Admitting: Pulmonary Disease

## 2022-02-16 ENCOUNTER — Telehealth: Payer: Self-pay | Admitting: Pulmonary Disease

## 2022-02-16 NOTE — Telephone Encounter (Signed)
Spoke to [place] with [employer]. She stated that she is going to reach out to her supervisor for an update.  ? ?Patient is aware and voiced his understanding.  ? ?

## 2022-02-18 NOTE — Telephone Encounter (Signed)
Spoke to patient. He stated that he has not been contacted by [employer] in reference to POC.  ? ?[facility] [employer] for update. Office is currently closed. Will call back during business hours.  ?

## 2022-02-18 NOTE — Telephone Encounter (Signed)
Spoke to [place] with [employer]. She stated that order was sent to customer service. She will pull order and contact patient.  ? ?

## 2022-02-19 NOTE — Telephone Encounter (Signed)
Spoke to patient. He stated that he spoke with [employer] yesterday and oxygen should be delivered today.  ?Nothing further needed.  ? ?

## 2022-02-23 ENCOUNTER — Other Ambulatory Visit: Payer: Self-pay

## 2022-02-23 ENCOUNTER — Ambulatory Visit
Admission: RE | Admit: 2022-02-23 | Discharge: 2022-02-23 | Disposition: A | Discharge status: Home / Self Care | Payer: BC Managed Care – PPO | Source: Ambulatory Visit | Attending: Oncology | Admitting: Oncology

## 2022-02-23 DIAGNOSIS — I2510 Atherosclerotic heart disease of native coronary artery without angina pectoris: Secondary | ICD-10-CM | POA: Diagnosis not present

## 2022-02-23 DIAGNOSIS — N200 Calculus of kidney: Secondary | ICD-10-CM | POA: Diagnosis not present

## 2022-02-23 DIAGNOSIS — J90 Pleural effusion, not elsewhere classified: Secondary | ICD-10-CM | POA: Diagnosis not present

## 2022-02-23 DIAGNOSIS — Z08 Encounter for follow-up examination after completed treatment for malignant neoplasm: Secondary | ICD-10-CM | POA: Insufficient documentation

## 2022-02-23 DIAGNOSIS — C3491 Malignant neoplasm of unspecified part of right bronchus or lung: Secondary | ICD-10-CM | POA: Insufficient documentation

## 2022-02-23 DIAGNOSIS — I700 Atherosclerosis of aorta: Secondary | ICD-10-CM | POA: Insufficient documentation

## 2022-02-23 MED ORDER — FLUDEOXYGLUCOSE F - 18 (FDG) INJECTION
10.4000 | Freq: Once | INTRAVENOUS | Status: AC | PRN
  Administered 2022-02-23: 10.4 via INTRAVENOUS

## 2022-02-24 ENCOUNTER — Ambulatory Visit (HOSPITAL_COMMUNITY): Payer: BC Managed Care – PPO

## 2022-03-02 ENCOUNTER — Inpatient Hospital Stay: Payer: BC Managed Care – PPO | Admitting: Oncology

## 2022-03-02 ENCOUNTER — Other Ambulatory Visit: Payer: Self-pay

## 2022-03-02 ENCOUNTER — Inpatient Hospital Stay: Payer: BC Managed Care – PPO | Attending: Oncology | Admitting: Nurse Practitioner

## 2022-03-02 ENCOUNTER — Inpatient Hospital Stay: Payer: BC Managed Care – PPO

## 2022-03-02 ENCOUNTER — Encounter: Payer: Self-pay | Admitting: Nurse Practitioner

## 2022-03-02 VITALS — BP 127/68 | HR 101 | Temp 97.5°F | Resp 18 | Ht 72.0 in | Wt 194.0 lb

## 2022-03-02 DIAGNOSIS — F17210 Nicotine dependence, cigarettes, uncomplicated: Secondary | ICD-10-CM | POA: Insufficient documentation

## 2022-03-02 DIAGNOSIS — C342 Malignant neoplasm of middle lobe, bronchus or lung: Secondary | ICD-10-CM | POA: Diagnosis present

## 2022-03-02 DIAGNOSIS — I509 Heart failure, unspecified: Secondary | ICD-10-CM | POA: Diagnosis not present

## 2022-03-02 DIAGNOSIS — F419 Anxiety disorder, unspecified: Secondary | ICD-10-CM | POA: Diagnosis not present

## 2022-03-02 DIAGNOSIS — C3491 Malignant neoplasm of unspecified part of right bronchus or lung: Secondary | ICD-10-CM

## 2022-03-02 DIAGNOSIS — E871 Hypo-osmolality and hyponatremia: Secondary | ICD-10-CM | POA: Insufficient documentation

## 2022-03-02 DIAGNOSIS — D509 Iron deficiency anemia, unspecified: Secondary | ICD-10-CM | POA: Diagnosis not present

## 2022-03-02 DIAGNOSIS — D72829 Elevated white blood cell count, unspecified: Secondary | ICD-10-CM | POA: Diagnosis not present

## 2022-03-02 DIAGNOSIS — J189 Pneumonia, unspecified organism: Secondary | ICD-10-CM | POA: Insufficient documentation

## 2022-03-02 DIAGNOSIS — A310 Pulmonary mycobacterial infection: Secondary | ICD-10-CM

## 2022-03-02 DIAGNOSIS — E039 Hypothyroidism, unspecified: Secondary | ICD-10-CM | POA: Insufficient documentation

## 2022-03-02 LAB — COMPREHENSIVE METABOLIC PANEL
ALT: 15 U/L (ref 0–44)
AST: 18 U/L (ref 15–41)
Albumin: 3.6 g/dL (ref 3.5–5.0)
Alkaline Phosphatase: 102 U/L (ref 38–126)
Anion gap: 8 (ref 5–15)
BUN: 6 mg/dL — ABNORMAL LOW (ref 8–23)
CO2: 28 mmol/L (ref 22–32)
Calcium: 8.9 mg/dL (ref 8.9–10.3)
Chloride: 94 mmol/L — ABNORMAL LOW (ref 98–111)
Creatinine, Ser: 0.59 mg/dL — ABNORMAL LOW (ref 0.61–1.24)
GFR, Estimated: >60 mL/min (ref >60)
Glucose, Bld: 115 mg/dL — ABNORMAL HIGH (ref 70–99)
Potassium: 4.1 mmol/L (ref 3.5–5.1)
Sodium: 130 mmol/L — ABNORMAL LOW (ref 135–145)
Total Bilirubin: 0.5 mg/dL (ref 0.3–1.2)
Total Protein: 7 g/dL (ref 6.5–8.1)

## 2022-03-02 LAB — CBC WITH DIFFERENTIAL/PLATELET
Abs Immature Granulocytes: 0.06 10*3/uL (ref 0.00–0.07)
Basophils Absolute: 0 10*3/uL (ref 0.0–0.1)
Basophils Relative: 0 %
Eosinophils Absolute: 0 10*3/uL (ref 0.0–0.5)
Eosinophils Relative: 0 %
HCT: 36.2 % — ABNORMAL LOW (ref 39.0–52.0)
Hemoglobin: 11.5 g/dL — ABNORMAL LOW (ref 13.0–17.0)
Immature Granulocytes: 0 %
Lymphocytes Relative: 2 %
Lymphs Abs: 0.3 10*3/uL — ABNORMAL LOW (ref 0.7–4.0)
MCH: 30.8 pg (ref 26.0–34.0)
MCHC: 31.8 g/dL (ref 30.0–36.0)
MCV: 97.1 fL (ref 80.0–100.0)
Monocytes Absolute: 0.3 10*3/uL (ref 0.1–1.0)
Monocytes Relative: 2 %
Neutro Abs: 13.6 10*3/uL — ABNORMAL HIGH (ref 1.7–7.7)
Neutrophils Relative %: 96 %
Platelets: 365 10*3/uL (ref 150–400)
RBC: 3.73 MIL/uL — ABNORMAL LOW (ref 4.22–5.81)
RDW: 15.7 % — ABNORMAL HIGH (ref 11.5–15.5)
WBC: 14.2 10*3/uL — ABNORMAL HIGH (ref 4.0–10.5)
nRBC: 0 % (ref 0.0–0.2)

## 2022-03-02 LAB — GLUCOSE, CAPILLARY: Glucose-Capillary: 100 mg/dL — ABNORMAL HIGH (ref 70–99)

## 2022-03-02 LAB — MAGNESIUM: Magnesium: 1.6 mg/dL — ABNORMAL LOW (ref 1.7–2.4)

## 2022-03-02 NOTE — Progress Notes (Signed)
[place]  [phone] [phone]  ID: Paul Carpenter OB: June 28, 1956  MR#: [id number]  UTM#:[id number]  Patient Care Team: [name], MD as PCP - General (Internal Medicine) [name], MD as Referring Physician (Vascular Surgery) [name], MD as Consulting Physician (Oncology) [name], MD as Referring Physician (Radiation Oncology)  CHIEF COMPLAINT: Recurrent stage IIIa non-small cell lung cancer, right middle lobe.  INTERVAL HISTORY: Patient returns to clinic today for follow up. He completed 4 cycles of taxotere 01/28/22 and was subsequently admitted to hospital 02/03/22-02/05/22 for shortness of breath, fever, found to have progressive multi-cavitary lesion in RUL with patchy tree in bud atypical infection He was seen by infectious disease and treated with antibiotics and anti-fungals. He continues to have chronic weakness and fatigue.  He has persistent shortness of breath and cough.  He continues to smoke.  He has no neurologic complaints.  He denies any fevers.  He denies any chest pain or hemoptysis. He denies any nausea, vomiting, constipation, or diarrhea. He has no urinary complaints.  Patient offers no further specific complaints today.  REVIEW OF SYSTEMS:   Review of Systems  Constitutional:  Positive for malaise/fatigue. Negative for fever and weight loss.  HENT: Negative.  Negative for congestion and sore throat.   Respiratory:  Positive for cough and shortness of breath. Negative for hemoptysis.   Cardiovascular: Negative.  Negative for chest pain and leg swelling.  Gastrointestinal: Negative.  Negative for abdominal pain, blood in stool, melena and nausea.  Genitourinary: Negative.  Negative for dysuria.  Musculoskeletal: Negative.  Negative for joint pain and neck pain.  Skin: Negative.  Negative for rash.  Neurological:  Positive for weakness. Negative for dizziness, sensory change, focal weakness and  headaches.  Psychiatric/Behavioral:  The patient is nervous/anxious.    As per HPI. Otherwise, a complete review of systems is negative.  PAST MEDICAL HISTORY: Past Medical History:  Diagnosis Date   Anginal pain ([place])    Anxiety    Asthma    Chest pain    CHF (congestive heart failure) (HCC)    Chicken pox    Complication of anesthesia    o2 dropped after neck fusion   COPD (chronic obstructive pulmonary disease) (HCC)    Coronary artery disease    Cough    chronic  clear phlegm   Dysrhythmia    palpitations   GERD (gastroesophageal reflux disease)    h/o reflux/ hoarsness   Hematochezia    Hemorrhoids    History of chickenpox    History of colon polyps    History of Helicobacter pylori infection    Hoarseness    Hypertension    Lung cancer ([place]) 05/2016   Chemo + rad tx's.    Migraines    OSA (obstructive sleep apnea)    has CPAP but does not use   Personal history of tobacco use, presenting hazards to health 03/05/2016   Pneumonia    5/17   Raynaud disease    Raynaud disease    Raynaud's disease    Rotator cuff tear    on right   Shortness of breath dyspnea    Sleep apnea    Ulcer (traumatic) of oral mucosa     PAST SURGICAL HISTORY: Past Surgical History:  Procedure Laterality Date   BACK SURGERY     cervical fusion x 2   CARDIAC CATHETERIZATION     CERVICAL DISCECTOMY     x 2   COLONOSCOPY  COLONOSCOPY N/A 07/25/2015   Procedure: COLONOSCOPY;  Surgeon: Lollie Sails, MD;  Location: [facility] ENDOSCOPY;  Service: Endoscopy;  Laterality: N/A;   COLONOSCOPY WITH PROPOFOL N/A 10/04/2017   Procedure: COLONOSCOPY WITH PROPOFOL;  Surgeon: Lollie Sails, MD;  Location: [facility] ENDOSCOPY;  Service: Endoscopy;  Laterality: N/A;   COLONOSCOPY WITH PROPOFOL N/A 07/10/2020   Procedure: COLONOSCOPY WITH PROPOFOL;  Surgeon: Toledo, Benay Pike, MD;  Location: [facility] ENDOSCOPY;  Service: Gastroenterology;  Laterality: N/A;   ELECTROMAGNETIC NAVIGATION BROCHOSCOPY Left  06/28/2016   Procedure: ELECTROMAGNETIC NAVIGATION BRONCHOSCOPY;  Surgeon: Vilinda Boehringer, MD;  Location: [facility] ORS;  Service: Cardiopulmonary;  Laterality: Left;   ENDOBRONCHIAL ULTRASOUND N/A 04/11/2018   Procedure: ENDOBRONCHIAL ULTRASOUND;  Surgeon: [name], MD;  Location: [facility] ORS;  Service: Cardiopulmonary;  Laterality: N/A;   ESOPHAGOGASTRODUODENOSCOPY N/A 07/25/2015   Procedure: ESOPHAGOGASTRODUODENOSCOPY (EGD);  Surgeon: Lollie Sails, MD;  Location: [facility] ENDOSCOPY;  Service: Endoscopy;  Laterality: N/A;   ESOPHAGOGASTRODUODENOSCOPY (EGD) WITH PROPOFOL N/A 07/10/2020   Procedure: ESOPHAGOGASTRODUODENOSCOPY (EGD) WITH PROPOFOL;  Surgeon: Toledo, Benay Pike, MD;  Location: [facility] ENDOSCOPY;  Service: Gastroenterology;  Laterality: N/A;   NASAL SINUS SURGERY     x 2    PORTA CATH INSERTION N/A 04/24/2018   Procedure: PORTA CATH INSERTION;  Surgeon: Algernon Huxley, MD;  Location: [place] CV LAB;  Service: Cardiovascular;  Laterality: N/A;   rotator cuff surgery Right    07/2016   SEPTOPLASTY     SKIN GRAFT      FAMILY HISTORY Family History  Problem Relation Age of Onset   Heart disease Father    Prostate cancer Father    Heart disease Paternal Grandmother    Heart attack Maternal Grandfather 55   Kidney cancer Neg Hx    Bladder Cancer Neg Hx    Other Neg Hx        pituitary abnormality       ADVANCED DIRECTIVES:    HEALTH MAINTENANCE: Social History   Tobacco Use   Smoking status: Every Day    Packs/day: 3.00    Years: 50.00    Pack years: 150.00    Types: Cigarettes   Smokeless tobacco: Never   Tobacco comments:    1 1/2 ppd 02/09/22 hfb  Vaping Use   Vaping Use: Never used  Substance Use Topics   Alcohol use: Yes    Alcohol/week: 2.0 standard drinks    Types: 2 Standard drinks or equivalent per week    Comment: moderate   Drug use: No     Allergies  Allergen Reactions   Lisinopril Rash   Varenicline Rash    Current Outpatient Medications   Medication Sig Dispense Refill   acetaminophen (TYLENOL) 500 MG tablet Take 500 mg by mouth daily as needed.     albuterol (PROAIR HFA) 108 (90 Base) MCG/ACT inhaler Inhale 2 puffs into the lungs every 4 (four) hours as needed for wheezing or shortness of breath. 18 g 2   albuterol (PROVENTIL) (2.5 MG/3ML) 0.083% nebulizer solution Take 3 mLs (2.5 mg total) by nebulization 4 (four) times daily as needed for wheezing or shortness of breath. 120 mL 2   atorvastatin (LIPITOR) 40 MG tablet Take 40 mg by mouth at bedtime.      chlorpheniramine-HYDROcodone 10-8 MG/5ML Take 5 mLs by mouth every 12 (twelve) hours as needed for cough. 70 mL 0   diazepam (VALIUM) 5 MG tablet Take 5 mg by mouth at bedtime as needed.     ferrous  sulfate 325 (65 FE) MG tablet Take 325 mg by mouth at bedtime.     Fluticasone-Umeclidin-Vilant (TRELEGY ELLIPTA) 200-62.5-25 MCG/INH AEPB Inhale 200 mcg into the lungs daily. 28 each 0   HYDROcodone-acetaminophen (NORCO/VICODIN) 5-325 MG tablet Take 1-2 tablets by mouth every 4 (four) hours as needed for moderate pain. (Patient not taking: Reported on 02/09/2022) 30 tablet 0   levothyroxine (SYNTHROID) 150 MCG tablet Take 150 mcg by mouth daily before breakfast.     lidocaine-prilocaine (EMLA) cream Apply 1 application topically as needed. 30 g 1   loratadine (CLARITIN) 10 MG tablet Take 10 mg by mouth daily. (Patient not taking: Reported on 02/09/2022)     magnesium oxide (MAG-OX) 400 MG tablet Take 600 mg by mouth 2 (two) times daily. Takes 1.5 tablet twice daily     nitroGLYCERIN (NITROSTAT) 0.4 MG SL tablet Place under the tongue. (Patient not taking: Reported on 01/28/2022)     OXYGEN Inhale 2 L into the lungs at bedtime.     pantoprazole (PROTONIX) 40 MG tablet Take 40 mg by mouth 2 (two) times daily.     sodium chloride 1 g tablet Take 1 tablet (1 g total) by mouth 3 (three) times daily with meals. 90 tablet 1   tadalafil (CIALIS) 5 MG tablet Take 1 tablet (5 mg total) by mouth  daily as needed for erectile dysfunction. (Patient not taking: Reported on 02/09/2022) 90 tablet 3   No current facility-administered medications for this visit.    OBJECTIVE: Vitals:   03/02/22 1402  BP: 127/68  Pulse: (!) 101  Resp: 18  Temp: (!) 97.5 F (36.4 C)  SpO2: 99%      Body mass index is 26.31 kg/m.    ECOG FS:1 - Symptomatic but completely ambulatory  General: Well-developed, well-nourished, no acute distress. Eyes: Pink conjunctiva, anicteric sclera. HEENT: Normocephalic, moist mucous membranes. Lungs: deep cough heard Heart: Regular rate and rhythm. Abdomen: Soft, nontender, no obvious distention. Musculoskeletal: No edema, cyanosis, or clubbing. Neuro: Alert, answering all questions appropriately. Cranial nerves grossly intact. Skin: No rashes or petechiae noted. Psych: Normal affect.   LAB RESULTS:  Lab Results  Component Value Date   NA 129 (L) 02/05/2022   K 4.0 02/05/2022   CL 92 (L) 02/05/2022   CO2 26 02/05/2022   GLUCOSE 93 02/05/2022   BUN 8 02/05/2022   CREATININE 0.72 02/05/2022   CALCIUM 8.3 (L) 02/05/2022   PROT 6.3 (L) 02/03/2022   ALBUMIN 3.1 (L) 02/03/2022   AST 25 02/03/2022   ALT 21 02/03/2022   ALKPHOS 95 02/03/2022   BILITOT 0.7 02/03/2022   GFRNONAA >60 02/05/2022   GFRAA >60 04/24/2020    Lab Results  Component Value Date   WBC 15.8 (H) 02/05/2022   NEUTROABS 8.3 (H) 02/04/2022   HGB 10.4 (L) 02/05/2022   HCT 32.4 (L) 02/05/2022   MCV 94.7 02/05/2022   PLT 331 02/05/2022   Lab Results  Component Value Date   IRON 49 12/28/2021   TIBC 241 (L) 12/28/2021   IRONPCTSAT 20 12/28/2021   Lab Results  Component Value Date   FERRITIN 318 11/20/2021     STUDIES:  ONCOLOGY HISTORY: Case discussed with pathologist and unable to determine whether this is adenocarcinoma or squamous cell carcinoma.  There is also insufficient tissue to do further testing.  Liquid biopsy did not reveal any actionable mutations.  MRI of the  brain completed on Mar 28, 2018 reviewed independently did not reveal metastatic disease.  Patient  completed XRT June 26, 2018.  He completed his concurrent single agent carboplatinum on June 21, 2018.  Patient had a reaction to Taxol during cycle 1 and this was discontinued.  He completed a year-long of maintenance durvalumab on June 27, 2019.   ASSESSMENT: Recurrent stage IIIa non-small cell lung cancer, right middle lobe.  PLAN:    1.  Recurrent stage IIIa non-small cell lung cancer, right middle lobe: See oncology history above.  PET scan results from November 16, 2021 reviewed independently with once again suspicion of progression of disease.  After lengthy discussion with the patient, it was agreed that no biopsy is necessary and may be too difficult given his recent C2 cervical fracture.  Patient and family have expressed interest in pursuing systemic chemotherapy is now s/p 4 cycles of single agent Taxotere given every 3 weeks with Udenyca support. He has now had PET for restaging on 02/23/22 which was independently reviewed and showed treatment response in RUL mass. Based on improvement of overall hypermetabolism.   2. Atypical/Fungal infection- I've reached out to Dr. Patsey Berthold to discuss. She recommends collecting another sputum culture to ensure that he has cleared the pseudomonas infection s/p cipro. She may consider bronchoscopy for him as well. He will also see Dr. [name] with infectious disease. Sputum showed Mycobacterium avium. Encouraged mask wearing.   2.  Iron deficiency anemia:  Patient's hemoglobin has trended up to 10.8, monitor.  He last received IV Venofer on December 03, 2021.  Iron studies in April had improved. Hold additional IV iron at this time.   3.  Colon polyps:  Patient has a personal history of greater then 10 adenomatous polyps on his most recent conoloscopy. He does not know of any family history of increased polyps or colon cancer.  Genetic testing to assess  for the APC mutation for FAP or AFAP was negative. Continue colonoscopies as per GI.  4. Tobacco Use: Chronic and unchanged.  Patient continues to heavily smoke.  He previously expressed understanding by continuing tobacco use increases his chance of recurrence.  5. Anxiety: Chronic and unchanged.  Continue Valium 10 mg every 12 hours as needed.  Continue treatment and evaluation per primary care.  IV Ativan has also been added to his premedication regimen.  6.  Hyponatremia: Chronic and unchanged.  Patient's sodium is 129 today.  Patient was counseled on alcohol consumption and how they can act as a diuretic and reduce sodium levels.  Continue salt tablets as per nephrology.   7.  Hypothyroidism: Appreciate endocrinology input.  Continue Synthroid as prescribed.  Follow-up with endocrinology as scheduled.  8.  CHF: Patient has an EF of 40%.  Continue evaluation and treatment per cardiology.  9.  Nausea: Patient does not complain of this today.  Continue Zofran as needed.  10.  C2 cervical fracture: Improving.  Continue follow-up with neurosurgery as scheduled.  11.  Bony aches: Patient does not complain of this today.  Continue Tylenol as needed.    12.  Mouth sores: Patient does not complain of this today.  Continue Magic mouthwash as needed.  13: Leukocytosis: Likely reactive, monitor.  14. Alcohol use- encouraged him to hold/minimize alcohol use during treatment.   Disposition:  Labs today Will reach out to Dr. Patsey Berthold to discuss possible bronchoscopy vs antibiotic treatment. Will have him follow up with Dr. Grayland Ormond after bronch for labs and consideration of taxotere.   Patient expressed understanding and was in agreement with this plan. He also understands that He  can call clinic at any time with any questions, concerns, or complaints.    Verlon Au, NP   03/02/2022

## 2022-03-03 ENCOUNTER — Telehealth: Payer: Self-pay | Admitting: Pulmonary Disease

## 2022-03-03 NOTE — Telephone Encounter (Signed)
Patient is aware of below message and voiced his understanding.  Nothing further needed.   

## 2022-03-03 NOTE — Telephone Encounter (Signed)
Dr. [name], please advise. Thanks 

## 2022-03-03 NOTE — Telephone Encounter (Signed)
From the PET CT: ? ?1. Thick-walled cavitary mass in the right upper lobe has decreased ?in overall hypermetabolism, indicative of treatment response. ?2. Multifocal areas of nodular consolidation and peribronchovascular ?nodularity with a waxing and waning appearance, suggesting ?combination slight improvement in metastatic disease with a ?superimposed atypical/fungal infectious process. ? ?I have discussed with [name], NP. She will have him collect another sputum to make sure that has cleared the Pseudomonas. He sees Beth on the 19th, I will be on ICU that day but will be available to see him and schedule bronch then. ?

## 2022-03-05 ENCOUNTER — Telehealth: Payer: Self-pay | Admitting: *Deleted

## 2022-03-05 NOTE — Telephone Encounter (Signed)
Patient called reporting that Paul Carpenter was to have called him regarding a cup for a sputum collection. Please advise ?

## 2022-03-08 ENCOUNTER — Other Ambulatory Visit: Payer: Self-pay | Admitting: Nurse Practitioner

## 2022-03-08 DIAGNOSIS — J189 Pneumonia, unspecified organism: Secondary | ICD-10-CM

## 2022-03-09 ENCOUNTER — Other Ambulatory Visit: Payer: Self-pay

## 2022-03-09 ENCOUNTER — Encounter: Payer: Self-pay | Admitting: Oncology

## 2022-03-09 DIAGNOSIS — C342 Malignant neoplasm of middle lobe, bronchus or lung: Secondary | ICD-10-CM | POA: Diagnosis not present

## 2022-03-09 DIAGNOSIS — J189 Pneumonia, unspecified organism: Secondary | ICD-10-CM

## 2022-03-09 LAB — EXPECTORATED SPUTUM ASSESSMENT W GRAM STAIN, RFLX TO RESP C

## 2022-03-11 ENCOUNTER — Inpatient Hospital Stay: Payer: BC Managed Care – PPO

## 2022-03-11 LAB — CULTURE, RESPIRATORY W GRAM STAIN: Culture: NORMAL

## 2022-03-15 ENCOUNTER — Telehealth: Payer: Self-pay | Admitting: Pulmonary Disease

## 2022-03-15 ENCOUNTER — Other Ambulatory Visit: Payer: Self-pay

## 2022-03-15 DIAGNOSIS — R899 Unspecified abnormal finding in specimens from other organs, systems and tissues: Secondary | ICD-10-CM

## 2022-03-15 NOTE — Telephone Encounter (Signed)
Patient's spouse, Robin(DPR) is aware of below message and voiced her understanding.  ?Nothing further needed.  ? ?

## 2022-03-15 NOTE — Telephone Encounter (Signed)
Spoke to patient's wife, Robin(DPR). ?Shirlean Mylar stated that she contacted ID and scheduled OV 03/18/2022. Shirlean Mylar would like TB test and CXR prior to ID visit. ? ?Dr. Patsey Berthold, please advise. Thanks ?

## 2022-03-15 NOTE — Telephone Encounter (Signed)
He does not need a chest x-ray because he has had a CT and PET/CT recently.  These were actually improving however this is just a precaution that he has to wear a mask until we get the final on the MAI/MAC.  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  This is the reason why we have to be careful.  A negative PPD will not exclude TB because he has received immunotherapy and chemotherapy for his cancer and that may make him immune compromised, however I am sure that the PPD will be done through ID, we do not do them at this clinic.  The Pseudomonas he had before has resolved but this now has allowed [place] to identify the AFB which probably was being obscured by the Pseudomonas previously.   ? ? ?

## 2022-03-17 ENCOUNTER — Telehealth: Payer: Self-pay

## 2022-03-17 NOTE — Telephone Encounter (Signed)
Per Dr. Patsey Berthold verbally- postpone 03/19/2022 visit until after seeing ID and treatment for MAC.  ?Appt r/s to 04/20/22. Patient is aware and voiced his understanding.  ?Nothing further needed.  ? ?

## 2022-03-18 ENCOUNTER — Ambulatory Visit: Payer: BC Managed Care – PPO | Attending: Infectious Diseases | Admitting: Infectious Diseases

## 2022-03-18 VITALS — BP 105/75 | HR 105 | Temp 98.3°F | Wt 192.0 lb

## 2022-03-18 DIAGNOSIS — X58XXXD Exposure to other specified factors, subsequent encounter: Secondary | ICD-10-CM | POA: Insufficient documentation

## 2022-03-18 DIAGNOSIS — J449 Chronic obstructive pulmonary disease, unspecified: Secondary | ICD-10-CM | POA: Insufficient documentation

## 2022-03-18 DIAGNOSIS — Z923 Personal history of irradiation: Secondary | ICD-10-CM | POA: Diagnosis not present

## 2022-03-18 DIAGNOSIS — I110 Hypertensive heart disease with heart failure: Secondary | ICD-10-CM | POA: Insufficient documentation

## 2022-03-18 DIAGNOSIS — A310 Pulmonary mycobacterial infection: Secondary | ICD-10-CM

## 2022-03-18 DIAGNOSIS — D509 Iron deficiency anemia, unspecified: Secondary | ICD-10-CM | POA: Insufficient documentation

## 2022-03-18 DIAGNOSIS — S129XXD Fracture of neck, unspecified, subsequent encounter: Secondary | ICD-10-CM | POA: Insufficient documentation

## 2022-03-18 DIAGNOSIS — E039 Hypothyroidism, unspecified: Secondary | ICD-10-CM | POA: Insufficient documentation

## 2022-03-18 DIAGNOSIS — Z5111 Encounter for antineoplastic chemotherapy: Secondary | ICD-10-CM | POA: Diagnosis not present

## 2022-03-18 DIAGNOSIS — I509 Heart failure, unspecified: Secondary | ICD-10-CM | POA: Diagnosis not present

## 2022-03-18 DIAGNOSIS — C3491 Malignant neoplasm of unspecified part of right bronchus or lung: Secondary | ICD-10-CM | POA: Diagnosis not present

## 2022-03-18 NOTE — Progress Notes (Signed)
NAME: Paul Carpenter  DOB: 04-19-1956  MRN: [id number]  Date/Time: 03/18/2022 11:31 AM  REQUESTING PROVIDER Dr. Patsey Berthold Subjective:  REASON FOR CONSULT: MAC Patient is here with his wife Paul Carpenter is a 66 y.o. 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  The right side of the chest hurts He is got yellowish sputum No hemoptysis No fever  Patient has a complicated lung history As per Dr. [name] note he was initially diagnosed with non-small cell carcinoma of the lung in 2019. On 04/11/2018 a bronch with EBUS done and biopsy taken of subcarinal mass- it showed  - POSITIVE FOR MALIGNANT CELLS IN A NECROTIC BACKGROUND.  MRI of the brain completed on Mar 28, 2018 did not reveal any metastatic disease.  He completed radiation June 26, 2018.  He completed concurrent single agent carboplatinum on June 21, 2018.  He had a reaction to Taxol during cycle 1 and this was discontinued.  He completed a year-long maintenance subdural level lab on June 27, 2019.  In July 2022 there was a concern for slight progression of the disease.  PET scan done on 06/03/2021 revealed a spiculated cavitary mass in the apical segment of the right upper lobe with similar SUV which was 3.5 versus 3.31-year ago.  So this was reassuring as per the radiologist.  However disease progression could not be definitely excluded.   no metastasis seen.  A CT scan done on 07/20/2021 showed continued slow evolution of a thick-walled cavitary mass in the right upper  lobe which remains concerning for slow-growing neoplasm.  He had a fall and fractured his C 2 in December , 2022.  He was in [facility] at that time.  CTs chest that was done that showed a large right upper lobe soft tissue mass with similar abnormal tissue involving the right pulmonary hilum extending medially into the ipsilateral mediastinum in keeping with a reported history of primary lung cancer. In January he was hospitalized at [facility] for hyponatremia.  At that time oncologist saw him and got another PET scan.  A bronchoscopy/biopsy could not be done due to C2 fracture.  A PET scan was done on 11/16/2021 and it showed similar size of the cavitary process in the right upper lobe.  Increased size and hypermetabolism compared to the PET on 06/02/2021.  Hypermetabolic pulmonary nodules many of which were new compared to the CAT scan of 10/20/2021.    He was diagnosed to have recurrence and was started on palliative chemo with  taxotere( 4 cycles) on 11/26/21- completed on 01/28/22 He was hospitalized  02/03/22-02/05/22 for sob and cough and fever and sputum sent which was pseudomonas, but realized after his discharge He saw Dr.[name] on 02/09/22 and received a 2 week course of cipro for necrotizing pneumonia with pseudomonas. A repeat PET scan done on 02/23/22 showed Thick-walled cavitary mass in the right upper lobe has decreased in overall hypermetabolism, indicative of treatment response. 2. Multifocal areas of nodular consolidation and peribronchovascular nodularity with a waxing and waning appearance, suggesting combination slight improvement in metastatic disease with  a superimposed atypical/fungal infectious process. 3. Mildly hypermetabolic low right paratracheal and right hilar lymph nodes, metastatic or reactive. 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 and he is referred to me for treatment Bronchoscopy on hold , as to treat MAC first and see improvement Current smoker Also drinks 1/2 gallon of  whisky a week as per his wife  Past Medical History:  Diagnosis Date   Anginal pain ([place])    Anxiety    Asthma    Chest pain    CHF (congestive heart failure) ([place])    Chicken pox    Complication of anesthesia    o2 dropped after neck fusion   COPD (chronic obstructive pulmonary disease) ([place])    Coronary artery disease    Cough    chronic  clear phlegm   Dysrhythmia    palpitations   GERD (gastroesophageal reflux disease)    h/o reflux/ hoarsness   Hematochezia    Hemorrhoids    History of chickenpox    History of colon polyps    History of Helicobacter pylori infection    Hoarseness    Hypertension    Lung cancer ([place]) 05/2016   Chemo + rad tx's.    Migraines    OSA (obstructive sleep apnea)    has CPAP but does not use   Personal history of tobacco use, presenting hazards to health 03/05/2016   Pneumonia    5/17   Raynaud disease    Raynaud disease    Raynaud's disease    Rotator cuff tear    on right   Shortness of breath dyspnea    Sleep apnea    Ulcer (traumatic) of oral mucosa     Past Surgical History:  Procedure Laterality Date   BACK SURGERY     cervical fusion x 2   CARDIAC CATHETERIZATION     CERVICAL DISCECTOMY     x 2   COLONOSCOPY     COLONOSCOPY N/A 07/25/2015   Procedure: COLONOSCOPY;  Surgeon: Lollie Sails, MD;  Location: [facility] ENDOSCOPY;  Service: Endoscopy;  Laterality: N/A;   COLONOSCOPY WITH PROPOFOL N/A 10/04/2017   Procedure: COLONOSCOPY WITH PROPOFOL;  Surgeon: Lollie Sails, MD;  Location: [facility] ENDOSCOPY;  Service: Endoscopy;  Laterality: N/A;   COLONOSCOPY WITH PROPOFOL N/A 07/10/2020   Procedure: COLONOSCOPY WITH PROPOFOL;  Surgeon: Toledo, Benay Pike, MD;  Location: [facility] ENDOSCOPY;  Service: Gastroenterology;  Laterality: N/A;   ELECTROMAGNETIC NAVIGATION BROCHOSCOPY Left 06/28/2016   Procedure: ELECTROMAGNETIC NAVIGATION BRONCHOSCOPY;  Surgeon: Vilinda Boehringer, MD;  Location: [facility] ORS;  Service: Cardiopulmonary;  Laterality:  Left;   ENDOBRONCHIAL ULTRASOUND N/A 04/11/2018   Procedure: ENDOBRONCHIAL ULTRASOUND;  Surgeon: [name], MD;  Location: [facility] ORS;  Service: Cardiopulmonary;  Laterality: N/A;   ESOPHAGOGASTRODUODENOSCOPY N/A 07/25/2015   Procedure: ESOPHAGOGASTRODUODENOSCOPY (EGD);  Surgeon: Lollie Sails, MD;  Location: [facility] ENDOSCOPY;  Service: Endoscopy;  Laterality: N/A;   ESOPHAGOGASTRODUODENOSCOPY (EGD) WITH PROPOFOL N/A 07/10/2020   Procedure: ESOPHAGOGASTRODUODENOSCOPY (EGD) WITH PROPOFOL;  Surgeon: Toledo, Benay Pike, MD;  Location: [facility] ENDOSCOPY;  Service: Gastroenterology;  Laterality: N/A;   NASAL SINUS SURGERY     x 2    PORTA CATH INSERTION N/A 04/24/2018   Procedure: PORTA CATH INSERTION;  Surgeon: Algernon Huxley, MD;  Location: [place] [facility] CV LAB;  Service: Cardiovascular;  Laterality: N/A;   rotator cuff surgery Right    07/2016   SEPTOPLASTY     SKIN GRAFT      Social History  Socioeconomic History   Marital status: Married    Spouse name: Not on file   Number of children: 3   Years of education: Not on file   Highest education level: Not on file  Occupational History   Occupation: Architect Work  Tobacco Use   Smoking status: Every Day    Packs/day: 3.00    Years: 50.00    Pack years: 150.00    Types: Cigarettes   Smokeless tobacco: Never   Tobacco comments:    1 1/2 ppd 02/09/22 hfb  Vaping Use   Vaping Use: Never used  Substance and Sexual Activity   Alcohol use: Yes    Alcohol/week: 2.0 standard drinks    Types: 2 Standard drinks or equivalent per week    Comment: moderate   Drug use: No   Sexual activity: Not on file  Other Topics Concern   Not on file  Social History Narrative   Not on file   Social Determinants of Health   Financial Resource Strain: Not on file  Food Insecurity: Not on file  Transportation Needs: Not on file  Physical Activity: Not on file  Stress: Not on file  Social Connections: Not on file  Intimate Partner Violence: Not on  file    Family History  Problem Relation Age of Onset   Heart disease Father    Prostate cancer Father    Heart disease Paternal Grandmother    Heart attack Maternal Grandfather 26   Kidney cancer Neg Hx    Bladder Cancer Neg Hx    Other Neg Hx        pituitary abnormality   Allergies  Allergen Reactions   Lisinopril Rash   Varenicline Rash   I? Current Outpatient Medications  Medication Sig Dispense Refill   acetaminophen (TYLENOL) 500 MG tablet Take 500 mg by mouth daily as needed.     albuterol (PROAIR HFA) 108 (90 Base) MCG/ACT inhaler Inhale 2 puffs into the lungs every 4 (four) hours as needed for wheezing or shortness of breath. 18 g 2   albuterol (PROVENTIL) (2.5 MG/3ML) 0.083% nebulizer solution Take 3 mLs (2.5 mg total) by nebulization 4 (four) times daily as needed for wheezing or shortness of breath. 120 mL 2   atorvastatin (LIPITOR) 40 MG tablet Take 40 mg by mouth at bedtime.      chlorpheniramine-HYDROcodone 10-8 MG/5ML Take 5 mLs by mouth every 12 (twelve) hours as needed for cough. 70 mL 0   diazepam (VALIUM) 5 MG tablet Take 5 mg by mouth at bedtime as needed.     ferrous sulfate 325 (65 FE) MG tablet Take 325 mg by mouth at bedtime.     Fluticasone-Umeclidin-Vilant (TRELEGY ELLIPTA) 200-62.5-25 MCG/INH AEPB Inhale 200 mcg into the lungs daily. 28 each 0   HYDROcodone-acetaminophen (NORCO/VICODIN) 5-325 MG tablet Take 1-2 tablets by mouth every 4 (four) hours as needed for moderate pain. 30 tablet 0   levothyroxine (SYNTHROID) 150 MCG tablet Take 150 mcg by mouth daily before breakfast.     lidocaine-prilocaine (EMLA) cream Apply 1 application topically as needed. 30 g 1   loratadine (CLARITIN) 10 MG tablet Take 10 mg by mouth daily.     magnesium oxide (MAG-OX) 400 MG tablet Take 600 mg by mouth 2 (two) times daily. Takes 1.5 tablet twice daily     nitroGLYCERIN (NITROSTAT) 0.4 MG SL tablet Place under the tongue.     OXYGEN Inhale 2 L into the lungs at bedtime.  pantoprazole (PROTONIX) 40 MG tablet Take 1 tablet (40 mg total) by mouth 2 (two) times daily before meals     predniSONE (DELTASONE) 10 MG tablet Take by mouth.     sodium chloride 1 g tablet Take 1 tablet (1 g total) by mouth 3 (three) times daily with meals. 90 tablet 1   tadalafil (CIALIS) 5 MG tablet Take 1 tablet (5 mg total) by mouth daily as needed for erectile dysfunction. 90 tablet 3   traMADol (ULTRAM) 50 MG tablet Take 50 mg by mouth every 8 (eight) hours as needed.     pantoprazole (PROTONIX) 40 MG tablet Take 40 mg by mouth 2 (two) times daily. (Patient not taking: Reported on 03/18/2022)     No current facility-administered medications for this visit.     Abtx:  Anti-infectives (From admission, onward)    None       REVIEW OF SYSTEMS:  Const: negative fever, negative chills, negative weight loss Eyes: negative diplopia or visual changes, negative eye pain ENT: negative coryza, negative sore throat Resp: cough, , dyspnea rt sided pleuritic chest pain Cards: negative for chest pain, palpitations, lower extremity edema GU: negative for frequency, dysuria and hematuria GI: Negative for abdominal pain, diarrhea, bleeding, constipation Skin: negative for rash and pruritus Heme: negative for easy bruising and gum/nose bleeding MS: fatigue Neurolo:negative for headaches, dizziness, vertigo, memory problems  Psych:depression  Endocrine: negative for thyroid, diabetes Allergy/Immunology-as above: Objective:  VITALS:  BP 105/75   Pulse (!) 105   Temp 98.3 F (36.8 C) (Temporal)   Wt 192 lb (87.1 kg)   SpO2 95%   BMI 26.04 kg/m   PHYSICAL EXAM:  General: Alert, cooperative, some distress, appears stated age.  Head: Normocephalic, without obvious abnormality, atraumatic. Eyes: Conjunctivae clear, anicteric sclerae. Pupils are equal ENT Nares normal. No drainage or sinus tenderness. Lips, mucosa, and tongue normal. No Thrush Neck: Supple, symmetrical, no  adenopathy, thyroid: non tender no carotid bruit and no JVD. Back: No CVA tenderness. Lungs: b/l air entry Rhonchi and crepts on rt side Heart: Regular rate and rhythm, no murmur, rub or gallop. Abdomen: Soft, non-tender,not distended. Bowel sounds normal. No masses Extremities: atraumatic, no cyanosis. No edema. No clubbing Skin: No rashes or lesions. Or bruising Lymph: Cervical, supraclavicular normal. Neurologic: Grossly non-focal  Labs    Latest Ref Rng & Units 03/02/2022    3:03 PM 02/05/2022    6:29 AM 02/04/2022    4:19 AM  CBC  WBC 4.0 - 10.5 K/uL 14.2   15.8   10.4    Hemoglobin 13.0 - 17.0 g/dL 11.5   10.4   9.5    Hematocrit 39.0 - 52.0 % 36.2   32.4   29.0    Platelets 150 - 400 K/uL 365   331   250         Latest Ref Rng & Units 03/02/2022    3:03 PM 02/05/2022    6:29 AM 02/04/2022    4:19 AM  CMP  Glucose 70 - 99 mg/dL 115   93   102    BUN 8 - 23 mg/dL _0 Creatinine 0.61 - 1.24 mg/dL 0.59   0.72   0.65    Sodium 135 - 145 mmol/L 130   129   129    Potassium 3.5 - 5.1 mmol/L 4.1   4.0   4.1    Chloride 98 - 111 mmol/L 94  92   96    CO2 22 - 32 mmol/L _0 Calcium 8.9 - 10.3 mg/dL 8.9   8.3   8.3    Total Protein 6.5 - 8.1 g/dL 7.0      Total Bilirubin 0.3 - 1.2 mg/dL 0.5      Alkaline Phos 38 - 126 U/L 102      AST 15 - 41 U/L 18      ALT 0 - 44 U/L 15          Microbiology: Recent Results (from the past 240 hour(s))  Culture, sputum-assessment     Status: None   Collection Time: 03/09/22  9:45 AM   Specimen: Sputum  Result Value Ref Range Status   Specimen Description   Final    SPU Performed at [facility], [address] [place], [place] [address]    Special Requests   Final    NONE Performed at [facility], [address], [place], [place] [address]    Sputum evaluation   Final    THIS SPECIMEN IS ACCEPTABLE FOR SPUTUM CULTURE Performed at [facility], [address] [place], [place] [address]     Report Status 03/09/2022 FINAL  Final  Acid Fast Culture with reflexed sensitivities     Status: Abnormal   Collection Time: 03/09/22  9:45 AM  Result Value Ref Range Status   Acid Fast Culture Positive (A)  Final    Comment: (NOTE) Acid-fast bacilli have been detected in culture at 1 week; see AFB Organism ID by DNA probe Performed At: [facility] [place], [place] [id number] Rush Farmer MD [id number]    Source of Sample SPUTUM  Final    Comment: Performed at [facility], [address] [place], [place] [address]  Culture, Respiratory w Gram Stain     Status: None   Collection Time: 03/09/22  9:45 AM   Specimen: Sputum  Result Value Ref Range Status   Specimen Description   Final    SPU Performed at [facility], [address] [place], [place] [address]    Special Requests   Final    NONE Reflexed from [id number] Performed at [facility], [address] [place], [place] [address]    Gram Stain   Final    ABUNDANT WBC PRESENT, PREDOMINANTLY PMN FEW GRAM POSITIVE COCCI    Culture   Final    Normal respiratory flora-no Staph aureus or Pseudomonas seen Performed at [facility], [place] [address] [place], [place] [address]    Report Status 03/11/2022 FINAL  Final  Acid Fast Smear (AFB)     Status: Abnormal (Preliminary result)   Collection Time: 03/09/22  9:45 AM  Result Value Ref Range Status   AFB Specimen Processing Concentration  Final   Acid Fast Smear Positive (A)  Final    Comment: (NOTE) 3+, 4-36 acid-fast bacilli per field at 400X magnification, fluorescent smear 1st Attempt - No answer on 03-13-22 DT 2nd Attempt - No answer on 03-15-22 at 1058. [place] Performed At: [facility] [place], [place] [id number] Rush Farmer MD [id number]    Source (AFB) PENDING  Incomplete  AFB Organism ID By DNA Probe     Status: Abnormal   Collection Time: 03/09/22  9:45 AM  Result Value  Ref Range Status   M tuberculosis complex Negative  Final   M avium  complex Positive (A)  Final   M kansasii Not Indicated  Final   M gordonae Not Indicated  Final   Other: NAIDN  Final    Comment: No additional identification testing is necessary.   Susceptibility Testing See reflex.  Final    Comment: (NOTE) Performed At: [facility] [place], [place] [id number] Rush Farmer MD [id number]     IMAGING RESULTS:  I have personally reviewed the films  ? Impression/Recommendation 66 year old male current smoker and alcohol use but recurrent non-small cell CA lung on the right side.  In 2019 had undergone chemo, radiation and immunotherapy Since January 2023 he had taken 4 cycles of palliative chemotherapy. No biopsy was taken the second time around because of C2 fracture Neck cavitary/necrotizing mass on the right upper lobe/middle lobe This is a combination of malignancy/infection and with underlying COPD and radiation damage. He was recently treated for Pseudomonas infection with ciprofloxacin for 2 weeks and got better I will discuss with Dr. Patsey Berthold and may continue the Cipro for a total of 4 weeks.  Also will send 2 more sputum's for AFB to see whether MAI is consistently present If so he will need treatment for that with azithromycin 500 mg daily 1200 mg daily, ethambutol and rifampin 600 mg daily.   Patient asked asked about bronchoscopy and surgery resection.  Bronc is needed at some time to see whether there is malignancy.  Risk of sampling error is very likely in his case It is going to be very difficult to treat the MAC  as response will not be optimal. Also side effects of medication should be noted He is a heavy alcohol user and and risk for liver injury increases  Discussed with him to cut down on alcohol  Hyperlipidemia on atorvastatin.  Interaction with rifampin will have to be considered  COPD  Hypothyroidism on Synthroid   Once he  has given 2 sputums and the results are available we will discuss further management.  __________________________________________________ Discussed with patient,and wife in detail Note:  This document was prepared using Dragon voice recognition software and may include unintentional dictation errors.

## 2022-03-18 NOTE — Patient Instructions (Addendum)
You are here for MAC treatemnt- will need 2 more sputum for afb- once we have the test result we can start treatment with azithromycin 542m, ethambutol 12078mand rifampin 60041mwill talk to Dr.[name] to discuss the plan further

## 2022-03-19 ENCOUNTER — Ambulatory Visit: Payer: BC Managed Care – PPO | Admitting: Primary Care

## 2022-03-21 NOTE — Progress Notes (Unsigned)
[place]  [phone]) [phone] Fax:(336) [phone]  ID: Paul Carpenter OB: [birth year]  MR#: [id number]  IHK#:[id number]  Patient Care Team: [name], MD as PCP - General (Internal Medicine) [name], MD as Referring Physician (Vascular Surgery) [name], MD as Consulting Physician (Oncology) [name], MD as Referring Physician (Radiation Oncology)  I connected with Paul Carpenter on 03/24/22 at  3:30 PM EDT by video enabled telemedicine visit and verified that I am speaking with the correct person using two identifiers.   I discussed the limitations, risks, security and privacy concerns of performing an evaluation and management service by telemedicine and the availability of in-person appointments. I also discussed with the patient that there may be a patient responsible charge related to this service. The patient expressed understanding and agreed to proceed.   Other persons participating in the visit and their role in the encounter: Patient, MD.  Patient's location: Home. Provider's location: Clinic.  CHIEF COMPLAINT: Recurrent stage IIIa non-small cell lung cancer, right middle lobe.  INTERVAL HISTORY: Patient agreed to video assisted telemedicine visit for further evaluation.  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  He continues to have chronic weakness and fatigue.  He has persistent shortness of breath, chest pain, and cough.  He denies any hemoptysis.  He continues to smoke.  He has no neurologic complaints.  He denies any fevers. He denies any nausea, vomiting, constipation, or diarrhea. He has no urinary complaints.  Patient offers no further specific complaints today.  REVIEW OF SYSTEMS:   Review of Systems  Constitutional:  Positive for malaise/fatigue. Negative for fever and weight loss.  HENT: Negative.   Negative for congestion and sore throat.   Respiratory:  Positive for cough and shortness of breath. Negative for hemoptysis.   Cardiovascular:  Positive for chest pain. Negative for leg swelling.  Gastrointestinal: Negative.  Negative for abdominal pain, blood in stool, melena and nausea.  Genitourinary: Negative.  Negative for dysuria.  Musculoskeletal: Negative.  Negative for joint pain and neck pain.  Skin: Negative.  Negative for rash.  Neurological:  Positive for weakness. Negative for dizziness, sensory change, focal weakness and headaches.  Psychiatric/Behavioral:  The patient is nervous/anxious.    As per HPI. Otherwise, a complete review of systems is negative.  PAST MEDICAL HISTORY: Past Medical History:  Diagnosis Date   Anginal pain ([place])    Anxiety    Asthma    Chest pain    CHF (congestive heart failure) (HCC)    Chicken pox    Complication of anesthesia    o2 dropped after neck fusion   COPD (chronic obstructive pulmonary disease) (HCC)    Coronary artery disease    Cough    chronic  clear phlegm   Dysrhythmia    palpitations   GERD (gastroesophageal reflux disease)    h/o reflux/ hoarsness   Hematochezia    Hemorrhoids    History of chickenpox    History of colon polyps    History of Helicobacter pylori infection    Hoarseness    Hypertension    Lung cancer ([place]) 05/2016   Chemo + rad tx's.    Migraines    OSA (obstructive sleep apnea)    has CPAP but does not use   Personal history of tobacco use, presenting hazards to health 03/05/2016   Pneumonia    5/17   Raynaud  disease    Raynaud disease    Raynaud's disease    Rotator cuff tear    on right   Shortness of breath dyspnea    Sleep apnea    Ulcer (traumatic) of oral mucosa     PAST SURGICAL HISTORY: Past Surgical History:  Procedure Laterality Date   BACK SURGERY     cervical fusion x 2   CARDIAC CATHETERIZATION     CERVICAL DISCECTOMY     x 2   COLONOSCOPY     COLONOSCOPY N/A  07/25/2015   Procedure: COLONOSCOPY;  Surgeon: Lollie Sails, MD;  Location[facility] ENDOSCOPY;  Service: Endoscopy;  Laterality: N/A;   COLONOSCOPY WITH PROPOFOL N/A 10/04/2017   Procedure: COLONOSCOPY WITH PROPOFOL;  Surgeon: Lollie Sails, MD;  Location: [facility] ENDOSCOPY;  Service: Endoscopy;  Laterality: N/A;   COLONOSCOPY WITH PROPOFOL N/A 07/10/2020   Procedure: COLONOSCOPY WITH PROPOFOL;  Surgeon: Toledo, Benay Pike, MD;  Location: [facility] ENDOSCOPY;  Service: Gastroenterology;  Laterality: N/A;   ELECTROMAGNETIC NAVIGATION BROCHOSCOPY Left 06/28/2016   Procedure: ELECTROMAGNETIC NAVIGATION BRONCHOSCOPY;  Surgeon: Vilinda Boehringer, MD;  Location: [facility] ORS;  Service: Cardiopulmonary;  Laterality: Left;   ENDOBRONCHIAL ULTRASOUND N/A 04/11/2018   Procedure: ENDOBRONCHIAL ULTRASOUND;  Surgeon: [name], MD;  Location: [facility] ORS;  Service: Cardiopulmonary;  Laterality: N/A;   ESOPHAGOGASTRODUODENOSCOPY N/A 07/25/2015   Procedure: ESOPHAGOGASTRODUODENOSCOPY (EGD);  Surgeon: Lollie Sails, MD;  Location: [facility] ENDOSCOPY;  Service: Endoscopy;  Laterality: N/A;   ESOPHAGOGASTRODUODENOSCOPY (EGD) WITH PROPOFOL N/A 07/10/2020   Procedure: ESOPHAGOGASTRODUODENOSCOPY (EGD) WITH PROPOFOL;  Surgeon: Toledo, Benay Pike, MD;  Location: [facility] ENDOSCOPY;  Service: Gastroenterology;  Laterality: N/A;   NASAL SINUS SURGERY     x 2    PORTA CATH INSERTION N/A 04/24/2018   Procedure: PORTA CATH INSERTION;  Surgeon: Algernon Huxley, MD;  Location: [place] CV LAB;  Service: Cardiovascular;  Laterality: N/A;   rotator cuff surgery Right    07/2016   SEPTOPLASTY     SKIN GRAFT      FAMILY HISTORY Family History  Problem Relation Age of Onset   Heart disease Father    Prostate cancer Father    Heart disease Paternal Grandmother    Heart attack Maternal Grandfather 67   Kidney cancer Neg Hx    Bladder Cancer Neg Hx    Other Neg Hx        pituitary abnormality       ADVANCED DIRECTIVES:    HEALTH  MAINTENANCE: Social History   Tobacco Use   Smoking status: Every Day    Packs/day: 3.00    Years: 50.00    Pack years: 150.00    Types: Cigarettes   Smokeless tobacco: Never   Tobacco comments:    1 1/2 ppd 02/09/22 hfb  Vaping Use   Vaping Use: Never used  Substance Use Topics   Alcohol use: Yes    Alcohol/week: 2.0 standard drinks    Types: 2 Standard drinks or equivalent per week    Comment: moderate   Drug use: No     Allergies  Allergen Reactions   Lisinopril Rash   Varenicline Rash    Current Outpatient Medications  Medication Sig Dispense Refill   acetaminophen (TYLENOL) 500 MG tablet Take 500 mg by mouth daily as needed.     albuterol (PROAIR HFA) 108 (90 Base) MCG/ACT inhaler Inhale 2 puffs into the lungs every 4 (four) hours as needed for wheezing or shortness of breath. 18 g 2  albuterol (PROVENTIL) (2.5 MG/3ML) 0.083% nebulizer solution Take 3 mLs (2.5 mg total) by nebulization 4 (four) times daily as needed for wheezing or shortness of breath. 120 mL 2   atorvastatin (LIPITOR) 40 MG tablet Take 40 mg by mouth at bedtime.      diazepam (VALIUM) 5 MG tablet Take 5 mg by mouth at bedtime as needed.     ferrous sulfate 325 (65 FE) MG tablet Take 325 mg by mouth at bedtime.     Fluticasone-Umeclidin-Vilant (TRELEGY ELLIPTA) 200-62.5-25 MCG/INH AEPB Inhale 200 mcg into the lungs daily. 28 each 0   HYDROcodone-acetaminophen (NORCO/VICODIN) 5-325 MG tablet Take 1-2 tablets by mouth every 4 (four) hours as needed for moderate pain. 30 tablet 0   levothyroxine (SYNTHROID) 150 MCG tablet Take 150 mcg by mouth daily before breakfast.     lidocaine-prilocaine (EMLA) cream Apply 1 application topically as needed. 30 g 1   loratadine (CLARITIN) 10 MG tablet Take 10 mg by mouth daily.     magnesium oxide (MAG-OX) 400 MG tablet Take 600 mg by mouth 2 (two) times daily. Takes 1.5 tablet twice daily     nitroGLYCERIN (NITROSTAT) 0.4 MG SL tablet Place under the tongue.      OXYGEN Inhale 2 L into the lungs at bedtime.     pantoprazole (PROTONIX) 40 MG tablet Take 1 tablet (40 mg total) by mouth 2 (two) times daily before meals     predniSONE (DELTASONE) 10 MG tablet Take by mouth.     sodium chloride 1 g tablet Take 1 tablet (1 g total) by mouth 3 (three) times daily with meals. 90 tablet 1   tadalafil (CIALIS) 5 MG tablet Take 1 tablet (5 mg total) by mouth daily as needed for erectile dysfunction. 90 tablet 3   traMADol (ULTRAM) 50 MG tablet Take 50 mg by mouth every 8 (eight) hours as needed.     chlorpheniramine-HYDROcodone 10-8 MG/5ML Take 5 mLs by mouth every 12 (twelve) hours as needed for cough. 115 mL 0   pantoprazole (PROTONIX) 40 MG tablet Take 40 mg by mouth 2 (two) times daily. (Patient not taking: Reported on 03/18/2022)     No current facility-administered medications for this visit.    OBJECTIVE: There were no vitals filed for this visit.      There is no height or weight on file to calculate BMI.    ECOG FS:1 - Symptomatic but completely ambulatory  General: Well-developed, well-nourished, no acute distress. HEENT: Normocephalic. Neuro: Alert, answering all questions appropriately. Cranial nerves grossly intact. Psych: Normal affect.  LAB RESULTS:  Lab Results  Component Value Date   NA 130 (L) 03/02/2022   K 4.1 03/02/2022   CL 94 (L) 03/02/2022   CO2 28 03/02/2022   GLUCOSE 115 (H) 03/02/2022   BUN 6 (L) 03/02/2022   CREATININE 0.59 (L) 03/02/2022   CALCIUM 8.9 03/02/2022   PROT 7.0 03/02/2022   ALBUMIN 3.6 03/02/2022   AST 18 03/02/2022   ALT 15 03/02/2022   ALKPHOS 102 03/02/2022   BILITOT 0.5 03/02/2022   GFRNONAA >60 03/02/2022   GFRAA >60 04/24/2020    Lab Results  Component Value Date   WBC 14.2 (H) 03/02/2022   NEUTROABS 13.6 (H) 03/02/2022   HGB 11.5 (L) 03/02/2022   HCT 36.2 (L) 03/02/2022   MCV 97.1 03/02/2022   PLT 365 03/02/2022   Lab Results  Component Value Date   IRON 49 12/28/2021   TIBC 241 (L)  12/28/2021  IRONPCTSAT 20 12/28/2021   Lab Results  Component Value Date   FERRITIN 318 11/20/2021     STUDIES:  ONCOLOGY HISTORY: Case discussed with pathologist and unable to determine whether this is adenocarcinoma or squamous cell carcinoma.  There is also insufficient tissue to do further testing.  Liquid biopsy did not reveal any actionable mutations.  MRI of the brain completed on Mar 28, 2018 reviewed independently did not reveal metastatic disease.  Patient completed XRT June 26, 2018.  He completed his concurrent single agent carboplatinum on June 21, 2018.  Patient had a reaction to Taxol during cycle 1 and this was discontinued.  He completed a year-long of maintenance durvalumab on June 27, 2019.   ASSESSMENT: Recurrent stage IIIa non-small cell lung cancer, right middle lobe.  PLAN:    1.  Recurrent stage IIIa non-small cell lung cancer, right middle lobe: See oncology history above.  PET scan results from November 16, 2021 reviewed independently with once again suspicion of progression of disease.  After lengthy discussion with the patient, it was agreed that no biopsy is necessary and may be too difficult given his recent C2 cervical fracture.  Patient and family expressed interest in systemic chemotherapy.  Patient received his fourth and final cycle of Taxotere on January 28, 2022.  No treatments are planned at this time while patient receiving extended antibiotic treatment for MAC.  His most recent imaging with PET scan on February 23, 2022 revealed persistent hypermetabolism, possibly secondary to underlying infection, but overall improved from previous. Will further discuss the timing of patient's next imaging with pulmonary and ID.  No intervention is needed at this time.  Patient will have video-assisted telemedicine visit in 6 weeks.    2.  Iron deficiency anemia: Patient's most recent hemoglobin is trended up to 11.5.  He last received IV Venofer on December 03, 2021.   3.   Colon polyps:  Patient has a personal history of greater then 10 adenomatous polyps on his most recent conoloscopy. He does not know of any family history of increased polyps or colon cancer.  Genetic testing to assess for the APC mutation for FAP or AFAP was negative. Continue colonoscopies as per GI. 4. Tobacco Use: Chronic and unchanged.  Patient continues to heavily smoke.  He previously expressed understanding by continuing tobacco use increases his chance of recurrence. 5. Anxiety: Chronic and unchanged.  Continue Valium 10 mg every 12 hours as needed.  Continue treatment and evaluation per primary care.  IV Ativan has also been added to his premedication regimen. 6.  Hyponatremia: Chronic and unchanged.  Patient's most recent sodium is 130.  Patient was counseled on alcohol consumption and how they can act as a diuretic and reduce sodium levels.  Continue salt tablets as per nephrology.  Repeat laboratory work in the next 1 to 2 weeks. 7.  Hypothyroidism: Appreciate endocrinology input.  Continue Synthroid as prescribed.  Follow-up with endocrinology as scheduled. 8.  CHF: Patient has an EF of 40%.  Continue evaluation and treatment per cardiology. 9.  Nausea: Patient does not complain of this today.  Continue Zofran as needed. 10.  C2 cervical fracture: Resolved.  Continue follow-up with neurosurgery as scheduled. 11.  Bony aches: Patient does not complain of this today.  Continue Tylenol as needed.   12.  Mouth sores: Patient does not complain of this today.  Continue Magic mouthwash as needed. 13: Leukocytosis: Likely reactive, monitor.  I provided 30 minutes of face-to-face video visit time  during this encounter which included chart review, counseling, and coordination of care as documented above.   Patient expressed understanding and was in agreement with this plan. He also understands that He can call clinic at any time with any questions, concerns, or complaints.    [name], MD   03/24/2022 7:26 AM

## 2022-03-23 ENCOUNTER — Encounter: Payer: Self-pay | Admitting: Oncology

## 2022-03-23 ENCOUNTER — Inpatient Hospital Stay (HOSPITAL_BASED_OUTPATIENT_CLINIC_OR_DEPARTMENT_OTHER): Payer: BC Managed Care – PPO | Admitting: Oncology

## 2022-03-23 DIAGNOSIS — C3491 Malignant neoplasm of unspecified part of right bronchus or lung: Secondary | ICD-10-CM

## 2022-03-23 MED ORDER — HYDROCOD POLI-CHLORPHE POLI ER 10-8 MG/5ML PO SUER
5.0000 mL | Freq: Two times a day (BID) | ORAL | 0 refills | Status: DC | PRN
Start: 2022-03-23 — End: 2022-06-16

## 2022-03-24 ENCOUNTER — Telehealth: Payer: Self-pay | Admitting: Pulmonary Disease

## 2022-03-24 ENCOUNTER — Encounter: Payer: Self-pay | Admitting: Oncology

## 2022-03-24 ENCOUNTER — Ambulatory Visit
Admission: RE | Admit: 2022-03-24 | Discharge: 2022-03-24 | Disposition: A | Discharge status: Home / Self Care | Payer: BC Managed Care – PPO | Source: Ambulatory Visit | Attending: Pulmonary Disease | Admitting: Pulmonary Disease

## 2022-03-24 DIAGNOSIS — R0781 Pleurodynia: Secondary | ICD-10-CM | POA: Diagnosis present

## 2022-03-24 DIAGNOSIS — R058 Other specified cough: Secondary | ICD-10-CM

## 2022-03-24 MED ORDER — METHYLPREDNISOLONE 4 MG PO TBPK: ORAL_TABLET | ORAL | 0 refills | Status: DC

## 2022-03-24 MED ORDER — AMOXICILLIN-POT CLAVULANATE 875-125 MG PO TABS: 1.0000 | ORAL_TABLET | Freq: Two times a day (BID) | ORAL | 0 refills | Status: DC

## 2022-03-24 NOTE — Telephone Encounter (Signed)
Spoke to patient's spouse, Robin(DPR). Shirlean Mylar stated that patient is currently seeing ID for possible MAC. Patient has not been started on treatment, as there are two sputum cultures pending.  Patient has a productive cough with clear to green sputum. He is experiencing pain the right side of his back, under breast line and into ribs. Shirlean Mylar is concerned that patient has broken a rib from coughing.  Patient is using icy hot, Tramadol BID and Vicodin PRN without relief.  Oncology called in cough syrup yesterday, however this has not been filled by pharmacy yet.  SOB is baseline.  Denied f/c/s or additional sx.  Dr. Patsey Berthold, please advise. Thanks.

## 2022-03-24 NOTE — Telephone Encounter (Signed)
Per Dr. Patsey Berthold verbally-- okay for medrol dosepak.   Patient's spouse, Robin(DPR) is aware of below message and voiced her understanding.  CXR/rib ordered. Medrol dosepak and Augmentin sent to preferred pharmacy.  Nothing further needed.

## 2022-03-24 NOTE — Telephone Encounter (Signed)
One of the things that is very important with MAI/MAC is that he needs to quit smoking.  Smoking is going to aggravate the infection.  If he is not allergic lets start him on some Augmentin 875 1 tablet twice a day for 10 days.  He needs to take it with food.  If he had medication sent to the pharmacy we cannot prescribe any more cough medicine until that is filled.  Lets have him do a chest x-ray and rib series on the side affected.  We will call him with results.

## 2022-03-24 NOTE — Telephone Encounter (Signed)
States her husband is not doing well (did not give details) and she wants an appointment with either Dr Haynes Bast or Dr [name]. Both providers are completely booked Patsey Berthold until August and [place] until [place]). Shirlean Mylar would like a call back to disuss things.

## 2022-03-25 ENCOUNTER — Other Ambulatory Visit: Payer: BC Managed Care – PPO

## 2022-03-31 ENCOUNTER — Inpatient Hospital Stay: Payer: BC Managed Care – PPO

## 2022-03-31 DIAGNOSIS — C342 Malignant neoplasm of middle lobe, bronchus or lung: Secondary | ICD-10-CM | POA: Diagnosis not present

## 2022-03-31 DIAGNOSIS — C3491 Malignant neoplasm of unspecified part of right bronchus or lung: Secondary | ICD-10-CM

## 2022-03-31 LAB — COMPREHENSIVE METABOLIC PANEL
ALT: 10 U/L (ref 0–44)
AST: 15 U/L (ref 15–41)
Albumin: 3.4 g/dL — ABNORMAL LOW (ref 3.5–5.0)
Alkaline Phosphatase: 100 U/L (ref 38–126)
Anion gap: 9 (ref 5–15)
BUN: 8 mg/dL (ref 8–23)
CO2: 26 mmol/L (ref 22–32)
Calcium: 8.5 mg/dL — ABNORMAL LOW (ref 8.9–10.3)
Chloride: 94 mmol/L — ABNORMAL LOW (ref 98–111)
Creatinine, Ser: 0.76 mg/dL (ref 0.61–1.24)
GFR, Estimated: >60 mL/min (ref >60)
Glucose, Bld: 118 mg/dL — ABNORMAL HIGH (ref 70–99)
Potassium: 3.8 mmol/L (ref 3.5–5.1)
Sodium: 129 mmol/L — ABNORMAL LOW (ref 135–145)
Total Bilirubin: 0.4 mg/dL (ref 0.3–1.2)
Total Protein: 6.6 g/dL (ref 6.5–8.1)

## 2022-03-31 LAB — CBC WITH DIFFERENTIAL/PLATELET
Abs Immature Granulocytes: 0.03 K/uL (ref 0.00–0.07)
Basophils Absolute: 0.1 K/uL (ref 0.0–0.1)
Basophils Relative: 1 %
Eosinophils Absolute: 0.1 K/uL (ref 0.0–0.5)
Eosinophils Relative: 0 %
HCT: 38.6 % — ABNORMAL LOW (ref 39.0–52.0)
Hemoglobin: 12.7 g/dL — ABNORMAL LOW (ref 13.0–17.0)
Immature Granulocytes: 0 %
Lymphocytes Relative: 5 %
Lymphs Abs: 0.6 K/uL — ABNORMAL LOW (ref 0.7–4.0)
MCH: 32.2 pg (ref 26.0–34.0)
MCHC: 32.9 g/dL (ref 30.0–36.0)
MCV: 97.7 fL (ref 80.0–100.0)
Monocytes Absolute: 0.7 K/uL (ref 0.1–1.0)
Monocytes Relative: 6 %
Neutro Abs: 10.9 K/uL — ABNORMAL HIGH (ref 1.7–7.7)
Neutrophils Relative %: 88 %
Platelets: 311 K/uL (ref 150–400)
RBC: 3.95 MIL/uL — ABNORMAL LOW (ref 4.22–5.81)
RDW: 14.4 % (ref 11.5–15.5)
WBC: 12.3 K/uL — ABNORMAL HIGH (ref 4.0–10.5)
nRBC: 0 % (ref 0.0–0.2)

## 2022-03-31 LAB — ACID FAST CULTURE WITH REFLEXED SENSITIVITIES (MYCOBACTERIA): Acid Fast Culture: POSITIVE — AB

## 2022-03-31 LAB — MAC SUSCEPTIBILITY BROTH
Ciprofloxacin: >8
Clarithromycin: 2
Doxycycline: >8
Linezolid: 32
Minocycline: >8
Moxifloxacin: 4
Rifabutin: 1
Rifampin: >4
Streptomycin: 32

## 2022-03-31 LAB — AFB ORGANISM ID BY DNA PROBE
M avium complex: POSITIVE — AB
M tuberculosis complex: NEGATIVE

## 2022-03-31 MED ORDER — SODIUM CHLORIDE 0.9% FLUSH
10.0000 mL | Freq: Once | INTRAVENOUS | Status: AC
  Administered 2022-03-31: 10 mL via INTRAVENOUS
  Filled 2022-03-31: qty 10

## 2022-03-31 MED ORDER — HEPARIN SOD (PORK) LOCK FLUSH 100 UNIT/ML IV SOLN
500.0000 [IU] | Freq: Once | INTRAVENOUS | Status: AC
  Administered 2022-03-31: 500 [IU] via INTRAVENOUS
  Filled 2022-03-31: qty 5

## 2022-04-04 ENCOUNTER — Other Ambulatory Visit: Payer: Self-pay | Admitting: Internal Medicine

## 2022-04-04 DIAGNOSIS — R911 Solitary pulmonary nodule: Secondary | ICD-10-CM

## 2022-04-04 DIAGNOSIS — R042 Hemoptysis: Secondary | ICD-10-CM

## 2022-04-05 ENCOUNTER — Telehealth: Payer: Self-pay | Admitting: *Deleted

## 2022-04-05 DIAGNOSIS — C3491 Malignant neoplasm of unspecified part of right bronchus or lung: Secondary | ICD-10-CM

## 2022-04-05 NOTE — Telephone Encounter (Signed)
Pt called in to report is having worsening chest wall pain that started a few months ago and has steadily gotten worse. Dr. Ginette Pitman prescribed tramadol but that has not helped relieve his pain. He is concerned that it could be related to his cancer and is asking for recommendations regarding pain management and if he needs any further imaging to follow up on his lung cancer.   Informed pt that most likely his symptoms are related to MAC but would further discuss with Dr. Grayland Ormond to get his recommendation.

## 2022-04-07 ENCOUNTER — Encounter: Payer: Self-pay | Admitting: Oncology

## 2022-04-07 LAB — MAC SUSCEPTIBILITY BROTH
Ciprofloxacin: >8
Clarithromycin: 2
Doxycycline: >8
Linezolid: 32
Minocycline: >8
Moxifloxacin: >4
Rifabutin: 0.5
Rifampin: >4
Streptomycin: 32

## 2022-04-07 LAB — ACID FAST CULTURE WITH REFLEXED SENSITIVITIES (MYCOBACTERIA)
Acid Fast Culture: POSITIVE — AB
Acid Fast Culture: POSITIVE — AB

## 2022-04-07 LAB — AFB ORGANISM ID BY DNA PROBE
M avium complex: POSITIVE — AB
M avium complex: POSITIVE — AB
M tuberculosis complex: NEGATIVE
M tuberculosis complex: NEGATIVE

## 2022-04-07 NOTE — Telephone Encounter (Signed)
Per Dr. Grayland Ormond, pt will need PET scan for lung cancer restaging and to assess chest wall pain. Orders placed and pt will follow up with Dr. Grayland Ormond and Merrily Pew a few days after PET to review results.

## 2022-04-08 ENCOUNTER — Encounter: Payer: Self-pay | Admitting: Infectious Diseases

## 2022-04-08 ENCOUNTER — Ambulatory Visit: Payer: BC Managed Care – PPO | Attending: Infectious Diseases | Admitting: Infectious Diseases

## 2022-04-08 VITALS — BP 131/76 | HR 106 | Temp 97.9°F | Ht 72.0 in | Wt 188.0 lb

## 2022-04-08 DIAGNOSIS — I509 Heart failure, unspecified: Secondary | ICD-10-CM | POA: Diagnosis not present

## 2022-04-08 DIAGNOSIS — F17210 Nicotine dependence, cigarettes, uncomplicated: Secondary | ICD-10-CM | POA: Diagnosis not present

## 2022-04-08 DIAGNOSIS — Z85118 Personal history of other malignant neoplasm of bronchus and lung: Secondary | ICD-10-CM | POA: Diagnosis not present

## 2022-04-08 DIAGNOSIS — Z923 Personal history of irradiation: Secondary | ICD-10-CM | POA: Diagnosis not present

## 2022-04-08 DIAGNOSIS — F1090 Alcohol use, unspecified, uncomplicated: Secondary | ICD-10-CM | POA: Diagnosis not present

## 2022-04-08 DIAGNOSIS — E039 Hypothyroidism, unspecified: Secondary | ICD-10-CM | POA: Diagnosis not present

## 2022-04-08 DIAGNOSIS — D509 Iron deficiency anemia, unspecified: Secondary | ICD-10-CM | POA: Insufficient documentation

## 2022-04-08 DIAGNOSIS — A310 Pulmonary mycobacterial infection: Secondary | ICD-10-CM | POA: Insufficient documentation

## 2022-04-08 DIAGNOSIS — J449 Chronic obstructive pulmonary disease, unspecified: Secondary | ICD-10-CM | POA: Insufficient documentation

## 2022-04-08 DIAGNOSIS — E871 Hypo-osmolality and hyponatremia: Secondary | ICD-10-CM | POA: Insufficient documentation

## 2022-04-08 MED ORDER — AZITHROMYCIN 500 MG PO TABS: 500.0000 mg | ORAL_TABLET | Freq: Every day | ORAL | 1 refills | Status: DC

## 2022-04-08 MED ORDER — ETHAMBUTOL HCL 400 MG PO TABS
15.0000 mg/kg | ORAL_TABLET | Freq: Every day | ORAL | 1 refills | Status: DC
Start: 2022-04-08 — End: 2022-05-06

## 2022-04-08 NOTE — Progress Notes (Signed)
NAME: Paul Carpenter  DOB: 30-Nov-1955  MRN: [id number]  Date/Time: 04/08/2022 10:32 AM  Subjective:  Patient is here with his wife  Pt is here for follow up of MAC testing- he has had 3 sputums positive for MAC now He is here to discuss treatment He has many questions regarding his condition [name] is a 66 y.o. 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  The right side of the chest hurts He is got yellowish sputum No hemoptysis No fever 2 more specimens taken 5/18 ( one on 5/17) showed MAC as well Here to discuss tretament Smokes 20/day   Patient has a complicated lung history As per Dr. [name] note he was initially diagnosed with non-small cell carcinoma of the lung in 2019. On 04/11/2018 a bronch with EBUS done and biopsy taken of subcarinal mass- it showed  - POSITIVE FOR MALIGNANT CELLS IN A NECROTIC BACKGROUND.  MRI of the brain completed on Mar 28, 2018 did not reveal any metastatic disease.  He completed radiation June 26, 2018.  He completed concurrent single agent carboplatinum on June 21, 2018.  He had a reaction to Taxol during cycle 1 and this was discontinued.  He completed a year-long maintenance subdural level lab on June 27, 2019.  In July 2022 there was a concern for slight progression of the disease.  PET scan done on 06/03/2021 revealed a spiculated cavitary mass in the apical segment of the right upper lobe with similar SUV which was 3.5 versus 3.31-year ago.  So this was reassuring as per the  radiologist.  However disease progression could not be definitely excluded.   no metastasis seen.  A CT scan done on 07/20/2021 showed continued slow evolution of a thick-walled cavitary mass in the right upper lobe which remains concerning for slow-growing neoplasm.  He had a fall and fractured his C 2 in December , 2022.  He was in [facility] at that time.  CTs chest that was done that showed a large right upper lobe soft tissue mass with similar abnormal tissue involving the right pulmonary hilum extending medially into the ipsilateral mediastinum in keeping with a reported history of primary lung cancer. In January he was hospitalized at [facility] for hyponatremia.  At that time oncologist saw him and got another PET scan.  A bronchoscopy/biopsy could not be done due to C2 fracture.  A PET scan was done on 11/16/2021 and it showed similar size of the cavitary process in the right upper lobe.  Increased size and hypermetabolism compared to the PET on 06/02/2021.  Hypermetabolic pulmonary nodules many of which were new compared to the CAT scan of 10/20/2021.    He was diagnosed to have recurrence and was started on palliative chemo with  taxotere( 4 cycles) on 11/26/21- completed on 01/28/22 He was hospitalized  02/03/22-02/05/22 for sob and cough and fever and sputum sent which was pseudomonas, but realized after his discharge He saw Dr.[name] on 02/09/22 and received a 2 week course of cipro for necrotizing pneumonia with pseudomonas. A repeat PET scan  done on 02/23/22 showed Thick-walled cavitary mass in the right upper lobe has decreased in overall hypermetabolism, indicative of treatment response. 2. Multifocal areas of nodular consolidation and peribronchovascular nodularity with a waxing and waning appearance, suggesting combination slight improvement in metastatic disease with a superimposed atypical/fungal infectious process. 3. Mildly hypermetabolic low right paratracheal and right hilar lymph nodes, metastatic or  reactive. 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 and he is referred to me for treatment Bronchoscopy on hold , as to treat MAC first and see improvement Current smoker Also drinks 1/2 gallon of whisky a week as per his wife  Past Medical History:  Diagnosis Date   Anginal pain ([place])    Anxiety    Asthma    Chest pain    CHF (congestive heart failure) ([place])    Chicken pox    Complication of anesthesia    o2 dropped after neck fusion   COPD (chronic obstructive pulmonary disease) ([place])    Coronary artery disease    Cough    chronic  clear phlegm   Dysrhythmia    palpitations   GERD (gastroesophageal reflux disease)    h/o reflux/ hoarsness   Hematochezia    Hemorrhoids    History of chickenpox    History of colon polyps    History of Helicobacter pylori infection    Hoarseness    Hypertension    Lung cancer ([place]) 05/2016   Chemo + rad tx's.    Migraines    OSA (obstructive sleep apnea)    has CPAP but does not use   Personal history of tobacco use, presenting hazards to health 03/05/2016   Pneumonia    5/17   Raynaud disease    Raynaud disease    Raynaud's disease    Rotator cuff tear    on right   Shortness of breath dyspnea    Sleep apnea    Ulcer (traumatic) of oral mucosa     Past Surgical History:  Procedure Laterality Date   BACK SURGERY     cervical fusion x 2   CARDIAC CATHETERIZATION     CERVICAL DISCECTOMY     x 2   COLONOSCOPY     COLONOSCOPY N/A 07/25/2015   Procedure: COLONOSCOPY;  Surgeon: Lollie Sails, MD;  Location[facility] ENDOSCOPY;  Service: Endoscopy;  Laterality: N/A;   COLONOSCOPY WITH PROPOFOL N/A 10/04/2017   Procedure: COLONOSCOPY WITH PROPOFOL;  Surgeon: Lollie Sails, MD;  Location: [facility] ENDOSCOPY;  Service: Endoscopy;  Laterality: N/A;   COLONOSCOPY WITH PROPOFOL N/A 07/10/2020   Procedure: COLONOSCOPY WITH PROPOFOL;  Surgeon: Toledo, Benay Pike, MD;  Location: [facility] ENDOSCOPY;  Service: Gastroenterology;  Laterality: N/A;    ELECTROMAGNETIC NAVIGATION BROCHOSCOPY Left 06/28/2016   Procedure: ELECTROMAGNETIC NAVIGATION BRONCHOSCOPY;  Surgeon: Vilinda Boehringer, MD;  Location: [facility] ORS;  Service: Cardiopulmonary;  Laterality: Left;   ENDOBRONCHIAL ULTRASOUND N/A 04/11/2018   Procedure: ENDOBRONCHIAL ULTRASOUND;  Surgeon: [name], MD;  Location: [facility] ORS;  Service: Cardiopulmonary;  Laterality: N/A;   ESOPHAGOGASTRODUODENOSCOPY N/A 07/25/2015   Procedure: ESOPHAGOGASTRODUODENOSCOPY (EGD);  Surgeon: Lollie Sails, MD;  Location: [facility] ENDOSCOPY;  Service: Endoscopy;  Laterality: N/A;   ESOPHAGOGASTRODUODENOSCOPY (EGD) WITH PROPOFOL N/A 07/10/2020   Procedure: ESOPHAGOGASTRODUODENOSCOPY (EGD) WITH PROPOFOL;  Surgeon: Toledo, Benay Pike, MD;  Location: [facility] ENDOSCOPY;  Service: Gastroenterology;  Laterality: N/A;   NASAL SINUS SURGERY     x 2    PORTA CATH INSERTION N/A 04/24/2018   Procedure: PORTA CATH INSERTION;  Surgeon: Algernon Huxley, MD;  Location: [place] CV LAB;  Service: Cardiovascular;  Laterality: N/A;   rotator cuff surgery Right    07/2016   SEPTOPLASTY     SKIN GRAFT      Social History   Socioeconomic History   Marital status: Married    Spouse name: Not on file   Number of children: 3   Years of education: Not on file   Highest education level: Not on file  Occupational History   Occupation: Architect Work  Tobacco Use   Smoking status: Every Day    Packs/day: 3.00    Years: 50.00    Total pack years: 150.00    Types: Cigarettes   Smokeless tobacco: Never   Tobacco comments:    1 1/2 ppd 02/09/22 hfb  Vaping Use   Vaping Use: Never used  Substance and Sexual Activity   Alcohol use: Yes    Alcohol/week: 2.0 standard drinks of alcohol    Types: 2 Standard drinks or equivalent per week    Comment: moderate   Drug use: No   Sexual activity: Not on file  Other Topics Concern   Not on file  Social History Narrative   Not on file   Social Determinants of Health   Financial  Resource Strain: Not on file  Food Insecurity: Not on file  Transportation Needs: Not on file  Physical Activity: Not on file  Stress: Not on file  Social Connections: Not on file  Intimate Partner Violence: Not on file    Family History  Problem Relation Age of Onset   Heart disease Father    Prostate cancer Father    Heart disease Paternal Grandmother    Heart attack Maternal Grandfather 96   Kidney cancer Neg Hx    Bladder Cancer Neg Hx    Other Neg Hx        pituitary abnormality   Allergies  Allergen Reactions   Lisinopril Rash   Varenicline Rash   I? Current Outpatient Medications  Medication Sig Dispense Refill   acetaminophen (TYLENOL) 500 MG tablet Take 500 mg by mouth daily as needed.     albuterol (PROAIR HFA) 108 (90 Base) MCG/ACT inhaler Inhale 2 puffs into the lungs every 4 (four) hours as needed for wheezing or shortness of breath. 18 g 2   albuterol (PROVENTIL) (2.5 MG/3ML) 0.083% nebulizer solution Take 3 mLs (2.5 mg total) by nebulization 4 (four) times daily as needed for wheezing or shortness of breath. 120 mL 2   atorvastatin (LIPITOR) 40 MG tablet Take 40 mg by mouth at bedtime.      chlorpheniramine-HYDROcodone 10-8 MG/5ML Take 5 mLs by mouth every 12 (twelve) hours as needed for cough. 115 mL 0   diazepam (VALIUM) 5 MG tablet Take 5 mg by mouth at bedtime as needed.     ferrous sulfate 325 (65 FE) MG tablet Take 325 mg by mouth at bedtime.     HYDROcodone-acetaminophen (NORCO/VICODIN) 5-325 MG tablet Take 1-2 tablets by mouth every 4 (four) hours as needed for moderate pain. 30 tablet 0   levothyroxine (SYNTHROID) 150 MCG tablet Take 150 mcg by mouth daily before breakfast.     lidocaine-prilocaine (EMLA) cream Apply 1 application topically as needed. 30 g 1   loratadine (CLARITIN) 10 MG tablet Take 10 mg by mouth daily.     magnesium oxide (MAG-OX) 400 MG tablet Take 600 mg by mouth 2 (two) times daily. Takes 1.5 tablet twice daily  nitroGLYCERIN  (NITROSTAT) 0.4 MG SL tablet Place under the tongue.     OXYGEN Inhale 2 L into the lungs at bedtime.     pantoprazole (PROTONIX) 40 MG tablet Take 1 tablet (40 mg total) by mouth 2 (two) times daily before meals     sodium chloride 1 g tablet Take 1 tablet (1 g total) by mouth 3 (three) times daily with meals. 90 tablet 1   tadalafil (CIALIS) 5 MG tablet Take 1 tablet (5 mg total) by mouth daily as needed for erectile dysfunction. 90 tablet 3   traMADol (ULTRAM) 50 MG tablet Take 50 mg by mouth every 8 (eight) hours as needed.     TRELEGY ELLIPTA 200-62.5-25 MCG/ACT AEPB INHALE 1 PUFF INTO THE LUNGS ONCE A DAY 60 each 5   amoxicillin-clavulanate (AUGMENTIN) 875-125 MG tablet Take 1 tablet by mouth 2 (two) times daily. (Patient not taking: Reported on 04/08/2022) 20 tablet 0   methylPREDNISolone (MEDROL DOSEPAK) 4 MG TBPK tablet Use as directed (Patient not taking: Reported on 04/08/2022) 21 each 0   predniSONE (DELTASONE) 10 MG tablet Take by mouth. (Patient not taking: Reported on 04/08/2022)     No current facility-administered medications for this visit.     Abtx:  Anti-infectives (From admission, onward)    None       REVIEW OF SYSTEMS:  Const: negative fever, negative chills, negative weight loss Eyes: negative diplopia or visual changes, negative eye pain ENT: negative coryza, negative sore throat Resp: cough, , dyspnea rt sided pleuritic chest pain Cards: negative for chest pain, palpitations, lower extremity edema GU: negative for frequency, dysuria and hematuria GI: Negative for abdominal pain, diarrhea, bleeding, constipation Skin: negative for rash and pruritus Heme: negative for easy bruising and gum/nose bleeding MS: fatigue Neurolo:negative for headaches, dizziness, vertigo, memory problems  Psych:depression  Endocrine: negative for thyroid, diabetes Allergy/Immunology-as above: Objective:  VITALS:  BP 131/76   Pulse (!) 106   Temp 97.9 F (36.6 C) (Temporal)   Ht  6' (1.829 m)   Wt 188 lb (85.3 kg)   SpO2 93%   BMI 25.50 kg/m   PHYSICAL EXAM:  General: Alert, cooperative, no  distress at rest,  Head: Normocephalic, without obvious abnormality, atraumatic. Eyes: Conjunctivae clear, anicteric sclerae. Pupils are equal ENT Nares normal. No drainage or sinus tenderness. Lips, mucosa, and tongue normal. No Thrush Neck: Supple, symmetrical, no adenopathy, thyroid: non tender no carotid bruit and no JVD. Back: No CVA tenderness. Lungs: b/l air entry Rhonchi and crepts on rt side Heart: Regular rate and rhythm, no murmur, rub or gallop. Abdomen: Soft, non-tender,not distended. Bowel sounds normal. No masses Extremities: atraumatic, no cyanosis. No edema. No clubbing Skin: No rashes or lesions. Or bruising Lymph: Cervical, supraclavicular normal. Neurologic: Grossly non-focal  Labs    Latest Ref Rng & Units 03/31/2022    1:58 PM 03/02/2022    3:03 PM 02/05/2022    6:29 AM  CBC  WBC 4.0 - 10.5 K/uL 12.3  14.2  15.8   Hemoglobin 13.0 - 17.0 g/dL 12.7  11.5  10.4   Hematocrit 39.0 - 52.0 % 38.6  36.2  32.4   Platelets 150 - 400 K/uL 311  365  331        Latest Ref Rng & Units 03/31/2022    1:58 PM 03/02/2022    3:03 PM 02/05/2022    6:29 AM  CMP  Glucose 70 - 99 mg/dL 118  115  93   BUN 8 -  23 mg/dL _0 Creatinine 0.61 - 1.24 mg/dL 0.76  0.59  0.72   Sodium 135 - 145 mmol/L 129  130  129   Potassium 3.5 - 5.1 mmol/L 3.8  4.1  4.0   Chloride 98 - 111 mmol/L 94  94  92   CO2 22 - 32 mmol/L _1 Calcium 8.9 - 10.3 mg/dL 8.5  8.9  8.3   Total Protein 6.5 - 8.1 g/dL 6.6  7.0    Total Bilirubin 0.3 - 1.2 mg/dL 0.4  0.5    Alkaline Phos 38 - 126 U/L 100  102    AST 15 - 41 U/L 15  18    ALT 0 - 44 U/L 10  15        Microbiology: No results found for this or any previous visit (from the past 240 hour(s)).   IMAGING RESULTS:  I have personally reviewed the films  ? Impression/Recommendation 66 year old male current smoker and  alcohol use but recurrent non-small cell CA lung on the right side.  In 2019 had undergone chemo, radiation and immunotherapy Since January 2023 he had taken 4 cycles of palliative chemotherapy. No biopsy was taken the second time around because of C2 fracture Neck cavitary/necrotizing mass on the right upper lobe/middle lobe This is a combination of malignancy/infection and with underlying COPD and radiation damage. He was recently treated for Pseudomonas infection with ciprofloxacin for 2 weeks and got better Now 3 samples of sputum has MAC  If so he will need treatment for that with azithromycin 500 mg daily 1200 mg daily, ethambutol and rifampin 600 mg daily.   He wants to try 2 drug regimen first which is alright and will avoid rifampin , to protect liver  He wants tp start when he gets back from the beach vacation Talked about side effects He needs to see ophthalmologist to get base line eye examine and look at the disk No DDI with azithro , ethambutol and his current meds Discussed with him to cut down on alcohol  Hyperlipidemia on atorvastatin.  COPD  Hypothyroidism on Synthroid  Follow up labs 2 weeks after starting treatment- follow up with me 1 month after starting treatment __________________________________________________ Discussed with patient,and wife in detail. Discussed with Pulmonologist Note:  This document was prepared using Dragon voice recognition software and may include unintentional dictation errors.

## 2022-04-08 NOTE — Patient Instructions (Addendum)
You are here for follow up- will start you on MAC treatment azithromycin 560m tablet once a day and ethambutol 40108mtablets three a day Will check labs in 2 weeks You will need to see eye doc and get a baseline examination before you start treatment and also while on treatment Sent the prescription to your pharmacy

## 2022-04-16 ENCOUNTER — Encounter: Payer: Self-pay | Admitting: *Deleted

## 2022-04-16 LAB — ACID FAST SMEAR (AFB, MYCOBACTERIA): Acid Fast Smear: POSITIVE — AB

## 2022-04-20 ENCOUNTER — Encounter: Payer: Self-pay | Admitting: Pulmonary Disease

## 2022-04-20 ENCOUNTER — Ambulatory Visit: Payer: BC Managed Care – PPO | Admitting: Pulmonary Disease

## 2022-04-20 VITALS — BP 126/72 | HR 107 | Temp 98.0°F | Ht 72.0 in | Wt 186.4 lb

## 2022-04-20 DIAGNOSIS — J441 Chronic obstructive pulmonary disease with (acute) exacerbation: Secondary | ICD-10-CM

## 2022-04-20 DIAGNOSIS — A310 Pulmonary mycobacterial infection: Secondary | ICD-10-CM | POA: Diagnosis not present

## 2022-04-20 DIAGNOSIS — F1010 Alcohol abuse, uncomplicated: Secondary | ICD-10-CM

## 2022-04-20 DIAGNOSIS — F17210 Nicotine dependence, cigarettes, uncomplicated: Secondary | ICD-10-CM | POA: Diagnosis not present

## 2022-04-20 DIAGNOSIS — C3491 Malignant neoplasm of unspecified part of right bronchus or lung: Secondary | ICD-10-CM

## 2022-04-20 MED ORDER — METHYLPREDNISOLONE 4 MG PO TBPK: ORAL_TABLET | ORAL | 0 refills | Status: DC

## 2022-04-20 MED ORDER — BREZTRI AEROSPHERE 160-9-4.8 MCG/ACT IN AERO: 2.0000 | INHALATION_SPRAY | Freq: Two times a day (BID) | RESPIRATORY_TRACT | 0 refills | Status: DC

## 2022-04-20 MED ORDER — BREZTRI AEROSPHERE 160-9-4.8 MCG/ACT IN AERO: 2.0000 | INHALATION_SPRAY | Freq: Two times a day (BID) | RESPIRATORY_TRACT | 11 refills | Status: DC

## 2022-04-20 NOTE — Patient Instructions (Signed)
We will see him in follow-up in 4 to 6 weeks time.  I have sent in a prescription for a Medrol Dosepak.  Follow the directions in the package.   We are switching your Trelegy to [facility] 2 puffs twice a day.  Not take the Trelegy anymore.  Please work on quitting smoking.  These limit your alcohol intake to just 1 drink a day.

## 2022-04-20 NOTE — Progress Notes (Signed)
Subjective:    Patient ID: Paul Carpenter, male    DOB: 03-24-1956, 66 y.o.   MRN: [id number] Patient Care Team: Barbette Reichmann, MD as PCP - General (Internal Medicine) Wyn Quaker Marlow Baars, MD as Referring Physician (Vascular Surgery) Jeralyn Ruths, MD as Consulting Physician (Oncology) Carmina Miller, MD as Referring Physician (Radiation Oncology) Salena Saner, MD as Consulting Physician (Pulmonary Disease) Lynn Ito, MD as Consulting Physician (Infectious Diseases)  Chief Complaint  Patient presents with   Follow-up    SOB with exertion, prod cough with thick green sputum and wheezing.     HPI Patient is a 66 year old current smoker (2 PPD) who presents for follow-up on the issue of a cavitary right upper lobe process in the setting of history of non-small cell carcinoma of the lung.  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  I first evaluated the patient on 09 February 2022.  He is originally followed by Dr. Belia Heman.  The patient does have a history of sleep apnea but is noncompliant with CPAP and is only on nocturnal oxygen.  He has been maintained on Trelegy Ellipta and DuoNebs as needed for his COPD.  He unfortunately continues to drink in excess and also continues to smoke as noted above.  Treated with Medrol on Augmentin towards the end of May for exacerbation.  He is being followed by infectious diseases for his MAC.  He is compliant with MAC therapy.  He does note some wheezing, shortness of breath on exertion, productive cough with thick green sputum.  Review of Systems A 10 point review of systems was performed and it is as noted above otherwise negative.  Patient Active Problem List   Diagnosis Date Noted   Hyponatremia 09/11/2022   Generalized weakness 09/10/2022   Shortness of breath 02/03/2022   SIADH (syndrome of inappropriate ADH production) (HCC) 09/22/2021   Overweight (BMI 25.0-29.9) 09/22/2021    Hypomagnesemia 09/21/2021   Elevated hemoglobin A1c 06/19/2020   Hypothyroidism, acquired 05/30/2019   Squamous cell lung cancer, right (HCC) 05/30/2019   HFrEF (heart failure with reduced ejection fraction) (HCC) 03/06/2019   Atherosclerosis 12/14/2018   Non-small cell lung cancer, right (HCC) 04/24/2018   Oral ulcer 02/16/2018   Pituitary disorder (HCC) 02/16/2018   Migraine headache 03/07/2017   HCAP (healthcare-associated pneumonia) 09/11/2016   COPD exacerbation (HCC) 09/11/2016   Chronic hyponatremia 09/11/2016   Leukocytosis 09/11/2016   Thrombocytopenia (HCC) 09/11/2016   Cigarette smoker 06/11/2016   Cervical radiculopathy 04/15/2016   Cervical disc disorder at C5-C6 level with radiculopathy 03/09/2016   Impingement syndrome of right shoulder 03/09/2016   Health care maintenance 09/29/2015   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   Benign essential hypertension 05/28/2015   Polycythemia 03/24/2015   Carotid artery disease (HCC) 12/12/2014   Disequilibrium 12/12/2014   Mixed hyperlipidemia 12/10/2014   Incomplete emptying of bladder 06/04/2014   Anxiety 05/18/2014   Chronic coronary artery disease 05/18/2014   Chronic headaches 05/18/2014   Acute shoulder pain 03/15/2014   Impingement syndrome of left shoulder 03/15/2014   Lung mass 12/06/2013   Kidney stone 11/24/2013   COPD (chronic obstructive pulmonary disease) (HCC) 04/24/2013   Obstructive sleep apnea 04/24/2013   Benign localized prostatic hyperplasia with lower urinary tract symptoms (LUTS) 07/04/2012   Encounter for long-term current use of medication 07/04/2012   ED (erectile dysfunction) of organic origin 07/04/2012   Testicular hypofunction 07/04/2012   Social History   Tobacco Use   Smoking status:  Every Day    Packs/day: 3.00    Years: 50.00    Additional pack years: 0.00    Total pack years: 150.00    Types: Cigarettes    Passive exposure: Current   Smokeless tobacco: Never   Tobacco comments:    2 PPD    Substance Use Topics   Alcohol use: Yes    Alcohol/week: 2.0 standard drinks of alcohol    Types: 2 Standard drinks or equivalent per week    Comment: moderate   Allergies  Allergen Reactions   Lisinopril Rash   Varenicline Rash   Current Meds  Medication Sig   acetaminophen (TYLENOL) 500 MG tablet Take 500 mg by mouth daily as needed.   albuterol (PROAIR HFA) 108 (90 Base) MCG/ACT inhaler Inhale 2 puffs into the lungs every 4 (four) hours as needed for wheezing or shortness of breath.   albuterol (PROVENTIL) (2.5 MG/3ML) 0.083% nebulizer solution Take 3 mLs (2.5 mg total) by nebulization 4 (four) times daily as needed for wheezing or shortness of breath.   atorvastatin (LIPITOR) 40 MG tablet Take 40 mg by mouth at bedtime.    azithromycin (ZITHROMAX) 500 MG tablet Take 1 tablet (500 mg total) by mouth daily.   Budeson-Glycopyrrol-Formoterol (BREZTRI AEROSPHERE) 160-9-4.8 MCG/ACT AERO Inhale 2 puffs into the lungs in the morning and at bedtime.   Budeson-Glycopyrrol-Formoterol (BREZTRI AEROSPHERE) 160-9-4.8 MCG/ACT AERO Inhale 2 puffs into the lungs in the morning and at bedtime.   chlorpheniramine-HYDROcodone 10-8 MG/5ML Take 5 mLs by mouth every 12 (twelve) hours as needed for cough.   diazepam (VALIUM) 5 MG tablet Take 5 mg by mouth at bedtime as needed.   ethambutol (MYAMBUTOL) 400 MG tablet Take 3 tablets (1,200 mg total) by mouth daily.   ferrous sulfate 325 (65 FE) MG tablet Take 325 mg by mouth at bedtime.   HYDROcodone-acetaminophen (NORCO/VICODIN) 5-325 MG tablet Take 1-2 tablets by mouth every 4 (four) hours as needed for moderate pain.   levothyroxine (SYNTHROID) 150 MCG tablet Take 150 mcg by mouth daily before breakfast.   lidocaine-prilocaine (EMLA) cream Apply 1 application topically as needed.   loratadine (CLARITIN) 10 MG tablet Take 10 mg by mouth daily.   magnesium oxide (MAG-OX) 400 MG tablet Take 600 mg by mouth 2 (two) times daily. Takes 1.5 tablet twice daily    methylPREDNISolone (MEDROL DOSEPAK) 4 MG TBPK tablet Take as directed in the package.   nitroGLYCERIN (NITROSTAT) 0.4 MG SL tablet Place under the tongue.   OXYGEN Inhale 2 L into the lungs at bedtime.   pantoprazole (PROTONIX) 40 MG tablet Take 1 tablet (40 mg total) by mouth 2 (two) times daily before meals   sodium chloride 1 g tablet Take 1 tablet (1 g total) by mouth 3 (three) times daily with meals.   tadalafil (CIALIS) 5 MG tablet Take 1 tablet (5 mg total) by mouth daily as needed for erectile dysfunction.   traMADol (ULTRAM) 50 MG tablet Take 50 mg by mouth every 8 (eight) hours as needed.   [DISCONTINUED] amoxicillin-clavulanate (AUGMENTIN) 875-125 MG tablet Take 1 tablet by mouth 2 (two) times daily.   [DISCONTINUED] methylPREDNISolone (MEDROL DOSEPAK) 4 MG TBPK tablet Use as directed   [DISCONTINUED] predniSONE (DELTASONE) 10 MG tablet Take by mouth.   [DISCONTINUED] TRELEGY ELLIPTA 200-62.5-25 MCG/ACT AEPB INHALE 1 PUFF INTO THE LUNGS ONCE A DAY   Immunization History  Administered Date(s) Administered   Fluad Quad(high Dose 65+) 09/22/2020   Influenza Inj Mdck Quad Pf 09/06/2016,  10/02/2019, 09/09/2021   Influenza Split 08/05/2013, 07/23/2014, 08/18/2015   Influenza Whole 08/01/2012   Influenza,inj,Quad PF,6+ Mos 08/14/2018   Influenza-Unspecified 09/06/2016, 08/17/2017, 09/09/2021   Zoster Recombinat (Shingrix) 06/19/2020      Objective:   Physical Exam BP 126/72 (BP Location: Left Arm, Cuff Size: Normal)   Pulse (!) 107   Temp 98 F (36.7 C) (Temporal)   Ht 6' (1.829 m)   Wt 186 lb 6.4 oz (84.6 kg)   SpO2 95%   BMI 25.28 kg/m   SpO2: 95 % O2 Device: None (Room air)  GENERAL: Debilitated appearing gentleman, well nourished.  Mild tachypnea at rest.  Fully ambulatory.  No conversational dyspnea. HEAD: Normocephalic, atraumatic.  EYES: Pupils equal, round, reactive to light.  No scleral icterus.  MOUTH: Oral mucosa moist.  No thrush. NECK: Supple. No  thyromegaly. Trachea midline. No JVD.  No adenopathy. PULMONARY: Good air entry bilaterally.  Loud rhonchi throughout particularly at the right base.  No wheezes. CARDIOVASCULAR: S1 and S2. Regular rate and rhythm.  No rubs, murmurs or gallops heard. ABDOMEN: Benign. MUSCULOSKELETAL: No joint deformity, no clubbing, no edema.  NEUROLOGIC: Mild psychomotor retardation.  No overt focal deficit.  No gait disturbance.  Speech is fluent. SKIN: Intact,warm,dry. PSYCH: Depressed mood and affect.       Assessment & Plan:     ICD-10-CM   1. Chronic obstructive pulmonary disease with acute exacerbation (HCC)  J44.1    Medrol Dosepak Switch Trelegy to [employer] 2 puffs twice a day Continue as needed albuterol STOP SMOKING!!    2. Mycobacterium avium infection (HCC)  A31.0    Continue medications per ID    3. Squamous cell lung cancer, right (HCC)  C34.91    Extensively treated Followed by oncology    4. Tobacco dependence due to cigarettes  F17.210    Patient counseled regards discontinuation of smoking Total counseling time 5 to 8 minutes    5. Alcohol abuse  F10.10    Counseled regards to curtailing use     Meds ordered this encounter  Medications   Budeson-Glycopyrrol-Formoterol (BREZTRI AEROSPHERE) 160-9-4.8 MCG/ACT AERO    Sig: Inhale 2 puffs into the lungs in the morning and at bedtime.    Dispense:  5.9 g    Refill:  0    Order Specific Question:   Lot Number?    Answer:   [id number]    Order Specific Question:   Expiration Date?    Answer:   10/01/2024    Order Specific Question:   Manufacturer?    Answer:   [employer] [[id number]]    Order Specific Question:   Quantity    Answer:   2   methylPREDNISolone (MEDROL DOSEPAK) 4 MG TBPK tablet    Sig: Take as directed in the package.    Dispense:  21 tablet    Refill:  0   Budeson-Glycopyrrol-Formoterol (BREZTRI AEROSPHERE) 160-9-4.8 MCG/ACT AERO    Sig: Inhale 2 puffs into the lungs in the morning and at bedtime.    Dispense:   10.7 g    Refill:  11   Will see the patient in follow-up in 4 to 6 weeks time call sooner should any new problems arise.   Gailen Shelter, MD Advanced Bronchoscopy P[facility]    *This note was dictated using voice recognition software/Dragon.  Despite best efforts to proofread, errors can occur which can change the meaning. Any transcriptional errors that result from this process are unintentional and may not  be fully corrected at the time of dictation.

## 2022-04-21 ENCOUNTER — Other Ambulatory Visit: Payer: Self-pay | Admitting: Neurosurgery

## 2022-04-21 DIAGNOSIS — M5412 Radiculopathy, cervical region: Secondary | ICD-10-CM

## 2022-04-21 DIAGNOSIS — M542 Cervicalgia: Secondary | ICD-10-CM

## 2022-04-21 DIAGNOSIS — Z8781 Personal history of (healed) traumatic fracture: Secondary | ICD-10-CM

## 2022-04-22 NOTE — Telephone Encounter (Signed)
PET approved and scheduled for 6/29 at 1:30pm. Pt scheduled for follow up after PET on 7/5 with Dr. Grayland Ormond and Merrily Pew. Pt made aware via mychart message.

## 2022-04-29 ENCOUNTER — Ambulatory Visit
Admission: RE | Admit: 2022-04-29 | Discharge: 2022-04-29 | Disposition: A | Discharge status: Home / Self Care | Payer: BC Managed Care – PPO | Source: Ambulatory Visit | Attending: Oncology | Admitting: Oncology

## 2022-04-29 DIAGNOSIS — M542 Cervicalgia: Secondary | ICD-10-CM | POA: Insufficient documentation

## 2022-04-29 DIAGNOSIS — N200 Calculus of kidney: Secondary | ICD-10-CM | POA: Insufficient documentation

## 2022-04-29 DIAGNOSIS — J449 Chronic obstructive pulmonary disease, unspecified: Secondary | ICD-10-CM | POA: Diagnosis not present

## 2022-04-29 DIAGNOSIS — Z9221 Personal history of antineoplastic chemotherapy: Secondary | ICD-10-CM | POA: Diagnosis not present

## 2022-04-29 DIAGNOSIS — R079 Chest pain, unspecified: Secondary | ICD-10-CM | POA: Insufficient documentation

## 2022-04-29 DIAGNOSIS — C3491 Malignant neoplasm of unspecified part of right bronchus or lung: Secondary | ICD-10-CM | POA: Insufficient documentation

## 2022-04-29 DIAGNOSIS — Z792 Long term (current) use of antibiotics: Secondary | ICD-10-CM | POA: Diagnosis not present

## 2022-04-29 DIAGNOSIS — I700 Atherosclerosis of aorta: Secondary | ICD-10-CM | POA: Insufficient documentation

## 2022-04-29 LAB — GLUCOSE, CAPILLARY: Glucose-Capillary: 86 mg/dL (ref 70–99)

## 2022-04-29 MED ORDER — FLUDEOXYGLUCOSE F - 18 (FDG) INJECTION
9.3000 | Freq: Once | INTRAVENOUS | Status: DC | PRN
Start: 2022-04-29 — End: 2022-05-05

## 2022-04-30 ENCOUNTER — Telehealth: Payer: Self-pay | Admitting: Pulmonary Disease

## 2022-04-30 NOTE — Telephone Encounter (Signed)
Spoke to patient and advised him to contact Dr. [name] office for PET results, as he was ordering provider. He voiced his understanding and had no further questions.  Nothing further needed.   Routing to Dr. Patsey Berthold as an Juluis Rainier

## 2022-04-30 NOTE — Telephone Encounter (Signed)
Agree 

## 2022-05-03 ENCOUNTER — Inpatient Hospital Stay: Payer: BC Managed Care – PPO | Attending: Oncology

## 2022-05-03 ENCOUNTER — Ambulatory Visit
Admission: RE | Admit: 2022-05-03 | Discharge: 2022-05-03 | Disposition: A | Discharge status: Home / Self Care | Payer: BC Managed Care – PPO | Source: Ambulatory Visit | Attending: Neurosurgery | Admitting: Neurosurgery

## 2022-05-03 ENCOUNTER — Other Ambulatory Visit: Payer: Self-pay

## 2022-05-03 DIAGNOSIS — F17210 Nicotine dependence, cigarettes, uncomplicated: Secondary | ICD-10-CM | POA: Insufficient documentation

## 2022-05-03 DIAGNOSIS — M5412 Radiculopathy, cervical region: Secondary | ICD-10-CM | POA: Insufficient documentation

## 2022-05-03 DIAGNOSIS — M542 Cervicalgia: Secondary | ICD-10-CM

## 2022-05-03 DIAGNOSIS — Z923 Personal history of irradiation: Secondary | ICD-10-CM | POA: Insufficient documentation

## 2022-05-03 DIAGNOSIS — I110 Hypertensive heart disease with heart failure: Secondary | ICD-10-CM | POA: Diagnosis not present

## 2022-05-03 DIAGNOSIS — Z8781 Personal history of (healed) traumatic fracture: Secondary | ICD-10-CM | POA: Insufficient documentation

## 2022-05-03 DIAGNOSIS — F419 Anxiety disorder, unspecified: Secondary | ICD-10-CM | POA: Insufficient documentation

## 2022-05-03 DIAGNOSIS — Z452 Encounter for adjustment and management of vascular access device: Secondary | ICD-10-CM | POA: Insufficient documentation

## 2022-05-03 DIAGNOSIS — E871 Hypo-osmolality and hyponatremia: Secondary | ICD-10-CM | POA: Insufficient documentation

## 2022-05-03 DIAGNOSIS — E039 Hypothyroidism, unspecified: Secondary | ICD-10-CM | POA: Insufficient documentation

## 2022-05-03 DIAGNOSIS — C3491 Malignant neoplasm of unspecified part of right bronchus or lung: Secondary | ICD-10-CM

## 2022-05-03 DIAGNOSIS — C342 Malignant neoplasm of middle lobe, bronchus or lung: Secondary | ICD-10-CM | POA: Diagnosis not present

## 2022-05-03 DIAGNOSIS — Z79899 Other long term (current) drug therapy: Secondary | ICD-10-CM | POA: Diagnosis not present

## 2022-05-03 DIAGNOSIS — I509 Heart failure, unspecified: Secondary | ICD-10-CM | POA: Diagnosis not present

## 2022-05-03 LAB — COMPREHENSIVE METABOLIC PANEL
ALT: 10 U/L (ref 0–44)
AST: 15 U/L (ref 15–41)
Albumin: 3.5 g/dL (ref 3.5–5.0)
Alkaline Phosphatase: 94 U/L (ref 38–126)
Anion gap: 7 (ref 5–15)
BUN: 7 mg/dL — ABNORMAL LOW (ref 8–23)
CO2: 24 mmol/L (ref 22–32)
Calcium: 8.4 mg/dL — ABNORMAL LOW (ref 8.9–10.3)
Chloride: 95 mmol/L — ABNORMAL LOW (ref 98–111)
Creatinine, Ser: 0.61 mg/dL (ref 0.61–1.24)
GFR, Estimated: >60 mL/min (ref >60)
Glucose, Bld: 104 mg/dL — ABNORMAL HIGH (ref 70–99)
Potassium: 3.9 mmol/L (ref 3.5–5.1)
Sodium: 126 mmol/L — ABNORMAL LOW (ref 135–145)
Total Bilirubin: 0.3 mg/dL (ref 0.3–1.2)
Total Protein: 6.6 g/dL (ref 6.5–8.1)

## 2022-05-03 LAB — CBC WITH DIFFERENTIAL/PLATELET
Abs Immature Granulocytes: 0.02 10*3/uL (ref 0.00–0.07)
Basophils Absolute: 0.1 10*3/uL (ref 0.0–0.1)
Basophils Relative: 1 %
Eosinophils Absolute: 0.1 10*3/uL (ref 0.0–0.5)
Eosinophils Relative: 1 %
HCT: 38.2 % — ABNORMAL LOW (ref 39.0–52.0)
Hemoglobin: 12.8 g/dL — ABNORMAL LOW (ref 13.0–17.0)
Immature Granulocytes: 0 %
Lymphocytes Relative: 4 %
Lymphs Abs: 0.4 10*3/uL — ABNORMAL LOW (ref 0.7–4.0)
MCH: 31.7 pg (ref 26.0–34.0)
MCHC: 33.5 g/dL (ref 30.0–36.0)
MCV: 94.6 fL (ref 80.0–100.0)
Monocytes Absolute: 0.6 10*3/uL (ref 0.1–1.0)
Monocytes Relative: 6 %
Neutro Abs: 8.5 10*3/uL — ABNORMAL HIGH (ref 1.7–7.7)
Neutrophils Relative %: 88 %
Platelets: 223 10*3/uL (ref 150–400)
RBC: 4.04 MIL/uL — ABNORMAL LOW (ref 4.22–5.81)
RDW: 14 % (ref 11.5–15.5)
WBC: 9.6 10*3/uL (ref 4.0–10.5)
nRBC: 0 % (ref 0.0–0.2)

## 2022-05-05 ENCOUNTER — Inpatient Hospital Stay: Payer: BC Managed Care – PPO | Admitting: Oncology

## 2022-05-05 ENCOUNTER — Encounter: Payer: Self-pay | Admitting: Oncology

## 2022-05-05 ENCOUNTER — Inpatient Hospital Stay: Payer: BC Managed Care – PPO | Admitting: Hospice and Palliative Medicine

## 2022-05-05 ENCOUNTER — Inpatient Hospital Stay (HOSPITAL_BASED_OUTPATIENT_CLINIC_OR_DEPARTMENT_OTHER): Payer: BC Managed Care – PPO | Admitting: Hospice and Palliative Medicine

## 2022-05-05 VITALS — BP 133/81 | HR 103 | Temp 96.9°F | Resp 18 | Ht 72.0 in | Wt 181.0 lb

## 2022-05-05 DIAGNOSIS — C3491 Malignant neoplasm of unspecified part of right bronchus or lung: Secondary | ICD-10-CM

## 2022-05-05 DIAGNOSIS — C342 Malignant neoplasm of middle lobe, bronchus or lung: Secondary | ICD-10-CM | POA: Diagnosis not present

## 2022-05-05 NOTE — Progress Notes (Signed)
Patient/wife requested to see me another day.  Case discussed with Dr. Grayland Ormond.

## 2022-05-05 NOTE — Progress Notes (Signed)
[facility]  Telephone:([phone] [phone]) [phone]  ID: Paul Carpenter OB: [birth year]  MR#: [id number]  XTK#:[id number]  Patient Care Team: [name], MD as PCP - General (Internal Medicine) [name], MD as Referring Physician (Vascular Surgery) [name], MD as Consulting Physician (Oncology) [name], MD as Referring Physician (Radiation Oncology)   CHIEF COMPLAINT: Recurrent stage IIIa non-small cell lung cancer, right middle lobe.  INTERVAL HISTORY: Patient returns to clinic today for further evaluation and discussion of his imaging results.  He continues to be followed by both pulmonary and infectious disease and has initiated treatment for MAC infection.  He continues to have chronic cough, musculoskeletal chest pain, and increased weakness and fatigue.  He also has persistent shortness of breath. He denies any hemoptysis.  He continues to smoke.  He has no neurologic complaints.  He denies any fevers. He denies any nausea, vomiting, constipation, or diarrhea. He has no urinary complaints.  Patient offers no further specific complaints today.  REVIEW OF SYSTEMS:   Review of Systems  Constitutional:  Positive for malaise/fatigue. Negative for fever and weight loss.  HENT: Negative.  Negative for congestion and sore throat.   Respiratory:  Positive for cough and shortness of breath. Negative for hemoptysis.   Cardiovascular:  Positive for chest pain. Negative for leg swelling.  Gastrointestinal: Negative.  Negative for abdominal pain, blood in stool, melena and nausea.  Genitourinary: Negative.  Negative for dysuria.  Musculoskeletal: Negative.  Negative for joint pain and neck pain.  Skin: Negative.  Negative for rash.  Neurological:  Positive for weakness. Negative for dizziness, sensory change, focal weakness and headaches.  Psychiatric/Behavioral:  The patient is nervous/anxious.     As per HPI. Otherwise, a complete review  of systems is negative.  PAST MEDICAL HISTORY: Past Medical History:  Diagnosis Date   Anginal pain ([place])    Anxiety    Asthma    Chest pain    CHF (congestive heart failure) (HCC)    Chicken pox    Complication of anesthesia    o2 dropped after neck fusion   COPD (chronic obstructive pulmonary disease) (HCC)    Coronary artery disease    Cough    chronic  clear phlegm   Dysrhythmia    palpitations   GERD (gastroesophageal reflux disease)    h/o reflux/ hoarsness   Hematochezia    Hemorrhoids    History of chickenpox    History of colon polyps    History of Helicobacter pylori infection    Hoarseness    Hypertension    Lung cancer ([place]) 05/2016   Chemo + rad tx's.    Migraines    OSA (obstructive sleep apnea)    has CPAP but does not use   Personal history of tobacco use, presenting hazards to health 03/05/2016   Pneumonia    5/17   Raynaud disease    Raynaud disease    Raynaud's disease    Rotator cuff tear    on right   Shortness of breath dyspnea    Sleep apnea    Ulcer (traumatic) of oral mucosa     PAST SURGICAL HISTORY: Past Surgical History:  Procedure Laterality Date   BACK SURGERY     cervical fusion x 2   CARDIAC CATHETERIZATION     CERVICAL DISCECTOMY     x 2   COLONOSCOPY     COLONOSCOPY N/A 07/25/2015   Procedure: COLONOSCOPY;  Surgeon: Lollie Sails, MD;  Location: [facility] ENDOSCOPY;  Service: Endoscopy;  Laterality: N/A;   COLONOSCOPY WITH PROPOFOL N/A 10/04/2017   Procedure: COLONOSCOPY WITH PROPOFOL;  Surgeon: Lollie Sails, MD;  Location: [facility] ENDOSCOPY;  Service: Endoscopy;  Laterality: N/A;   COLONOSCOPY WITH PROPOFOL N/A 07/10/2020   Procedure: COLONOSCOPY WITH PROPOFOL;  Surgeon: Toledo, Benay Pike, MD;  Location: [facility] ENDOSCOPY;  Service: Gastroenterology;  Laterality: N/A;   ELECTROMAGNETIC NAVIGATION BROCHOSCOPY Left 06/28/2016   Procedure: ELECTROMAGNETIC NAVIGATION BRONCHOSCOPY;  Surgeon: Vilinda Boehringer, MD;  Location: [facility] ORS;   Service: Cardiopulmonary;  Laterality: Left;   ENDOBRONCHIAL ULTRASOUND N/A 04/11/2018   Procedure: ENDOBRONCHIAL ULTRASOUND;  Surgeon: [name], MD;  Location: [facility] ORS;  Service: Cardiopulmonary;  Laterality: N/A;   ESOPHAGOGASTRODUODENOSCOPY N/A 07/25/2015   Procedure: ESOPHAGOGASTRODUODENOSCOPY (EGD);  Surgeon: Lollie Sails, MD;  Location[facility] ENDOSCOPY;  Service: Endoscopy;  Laterality: N/A;   ESOPHAGOGASTRODUODENOSCOPY (EGD) WITH PROPOFOL N/A 07/10/2020   Procedure: ESOPHAGOGASTRODUODENOSCOPY (EGD) WITH PROPOFOL;  Surgeon: Toledo, Benay Pike, MD;  Location: [facility] ENDOSCOPY;  Service: Gastroenterology;  Laterality: N/A;   NASAL SINUS SURGERY     x 2    PORTA CATH INSERTION N/A 04/24/2018   Procedure: PORTA CATH INSERTION;  Surgeon: Algernon Huxley, MD;  Location: [facility] LAB;  Service: Cardiovascular;  Laterality: N/A;   rotator cuff surgery Right    07/2016   SEPTOPLASTY     SKIN GRAFT      FAMILY HISTORY Family History  Problem Relation Age of Onset   Heart disease Father    Prostate cancer Father    Heart disease Paternal Grandmother    Heart attack Maternal Grandfather 37   Kidney cancer Neg Hx    Bladder Cancer Neg Hx    Other Neg Hx        pituitary abnormality       ADVANCED DIRECTIVES:    HEALTH MAINTENANCE: Social History   Tobacco Use   Smoking status: Every Day    Packs/day: 3.00    Years: 50.00    Total pack years: 150.00    Types: Cigarettes   Smokeless tobacco: Never   Tobacco comments:    2PPD 04/20/2022  Vaping Use   Vaping Use: Never used  Substance Use Topics   Alcohol use: Yes    Alcohol/week: 2.0 standard drinks of alcohol    Types: 2 Standard drinks or equivalent per week    Comment: moderate   Drug use: No     Allergies  Allergen Reactions   Lisinopril Rash   Varenicline Rash    Current Outpatient Medications  Medication Sig Dispense Refill   acetaminophen (TYLENOL) 500 MG tablet Take 500 mg by mouth daily as needed.      albuterol (PROAIR HFA) 108 (90 Base) MCG/ACT inhaler Inhale 2 puffs into the lungs every 4 (four) hours as needed for wheezing or shortness of breath. 18 g 2   albuterol (PROVENTIL) (2.5 MG/3ML) 0.083% nebulizer solution Take 3 mLs (2.5 mg total) by nebulization 4 (four) times daily as needed for wheezing or shortness of breath. 120 mL 2   atorvastatin (LIPITOR) 40 MG tablet Take 40 mg by mouth at bedtime.      azithromycin (ZITHROMAX) 500 MG tablet Take 1 tablet (500 mg total) by mouth daily. 30 tablet 1   Budeson-Glycopyrrol-Formoterol (BREZTRI AEROSPHERE) 160-9-4.8 MCG/ACT AERO Inhale 2 puffs into the lungs in the morning and at bedtime. 5.9 g 0   Budeson-Glycopyrrol-Formoterol (BREZTRI AEROSPHERE) 160-9-4.8 MCG/ACT AERO Inhale 2 puffs into the lungs  in the morning and at bedtime. 10.7 g 11   chlorpheniramine-HYDROcodone 10-8 MG/5ML Take 5 mLs by mouth every 12 (twelve) hours as needed for cough. 115 mL 0   diazepam (VALIUM) 5 MG tablet Take 5 mg by mouth at bedtime as needed.     diazepam (VALIUM) 5 MG tablet Take by mouth.     ethambutol (MYAMBUTOL) 400 MG tablet Take 3 tablets (1,200 mg total) by mouth daily. 90 tablet 1   ferrous sulfate 325 (65 FE) MG tablet Take 325 mg by mouth at bedtime.     HYDROcodone-acetaminophen (NORCO/VICODIN) 5-325 MG tablet Take 1-2 tablets by mouth every 4 (four) hours as needed for moderate pain. 30 tablet 0   levothyroxine (SYNTHROID) 150 MCG tablet Take 150 mcg by mouth daily before breakfast.     lidocaine-prilocaine (EMLA) cream Apply 1 application topically as needed. 30 g 1   loratadine (CLARITIN) 10 MG tablet Take 10 mg by mouth daily.     magnesium oxide (MAG-OX) 400 MG tablet Take 600 mg by mouth 2 (two) times daily. Takes 1.5 tablet twice daily     methylPREDNISolone (MEDROL DOSEPAK) 4 MG TBPK tablet Take as directed in the package. 21 tablet 0   nitroGLYCERIN (NITROSTAT) 0.4 MG SL tablet Place under the tongue.     OXYGEN Inhale 2 L into the  lungs at bedtime.     pantoprazole (PROTONIX) 40 MG tablet Take 1 tablet (40 mg total) by mouth 2 (two) times daily before meals     sodium chloride 1 g tablet Take 1 tablet (1 g total) by mouth 3 (three) times daily with meals. 90 tablet 1   tadalafil (CIALIS) 5 MG tablet Take 1 tablet (5 mg total) by mouth daily as needed for erectile dysfunction. 90 tablet 3   traMADol (ULTRAM) 50 MG tablet Take 50 mg by mouth every 8 (eight) hours as needed.     No current facility-administered medications for this visit.    OBJECTIVE: Vitals:   05/05/22 1100  BP: 133/81  Pulse: (!) 103  Resp: 18  Temp: (!) 96.9 F (36.1 C)  SpO2: 100%        Body mass index is 24.55 kg/m.    ECOG FS:1 - Symptomatic but completely ambulatory  General: Ill-appearing, no acute distress. Eyes: Pink conjunctiva, anicteric sclera. HEENT: Normocephalic, moist mucous membranes. Lungs: No audible wheezing or coughing. Heart: Regular rate and rhythm. Abdomen: Soft, nontender, no obvious distention. Musculoskeletal: No edema, cyanosis, or clubbing. Neuro: Alert, answering all questions appropriately. Cranial nerves grossly intact. Skin: No rashes or petechiae noted. Psych: Normal affect.   LAB RESULTS:  Lab Results  Component Value Date   NA 126 (L) 05/03/2022   K 3.9 05/03/2022   CL 95 (L) 05/03/2022   CO2 24 05/03/2022   GLUCOSE 104 (H) 05/03/2022   BUN 7 (L) 05/03/2022   CREATININE 0.61 05/03/2022   CALCIUM 8.4 (L) 05/03/2022   PROT 6.6 05/03/2022   ALBUMIN 3.5 05/03/2022   AST 15 05/03/2022   ALT 10 05/03/2022   ALKPHOS 94 05/03/2022   BILITOT 0.3 05/03/2022   GFRNONAA >60 05/03/2022   GFRAA >60 04/24/2020    Lab Results  Component Value Date   WBC 9.6 05/03/2022   NEUTROABS 8.5 (H) 05/03/2022   HGB 12.8 (L) 05/03/2022   HCT 38.2 (L) 05/03/2022   MCV 94.6 05/03/2022   PLT 223 05/03/2022   Lab Results  Component Value Date   IRON 49 12/28/2021  TIBC 241 (L) 12/28/2021   IRONPCTSAT  20 12/28/2021   Lab Results  Component Value Date   FERRITIN 318 11/20/2021     STUDIES:  ONCOLOGY HISTORY: Case discussed with pathologist and unable to determine whether this is adenocarcinoma or squamous cell carcinoma.  There is also insufficient tissue to do further testing.  Liquid biopsy did not reveal any actionable mutations.  MRI of the brain completed on Mar 28, 2018 reviewed independently did not reveal metastatic disease.  Patient completed XRT June 26, 2018.  He completed his concurrent single agent carboplatinum on June 21, 2018.  Patient had a reaction to Taxol during cycle 1 and this was discontinued.  He completed a year-long of maintenance durvalumab on June 27, 2019.   PET scan results from November 16, 2021 reviewed independently with once again suspicion of progression of disease.  After lengthy discussion with the patient, it was agreed that no biopsy is necessary and may be too difficult given his recent C2 cervical fracture.  Patient and family expressed interest in systemic chemotherapy.  Patient received his fourth and final cycle of Taxotere on January 28, 2022.    ASSESSMENT: Recurrent stage IIIa non-small cell lung cancer, right middle lobe.  PLAN:    1.  Recurrent stage IIIa non-small cell lung cancer, right middle lobe: See oncology history above.  No treatments are planned at this time while patient receiving extended antibiotic treatment for MAC.  PET scan on April 29, 2022 reviewed independently revealing persistent hypermetabolism that is likely more related to his MAC infection than progressive disease.  No further treatments are planned at this time.  Continue close follow-up with infectious disease and pulmonary.  Return to clinic in 3 months for further evaluation.     2.  Iron deficiency anemia: Nearly resolved.  Patient's most recent hemoglobin is 12.8.  He last received IV Venofer on December 03, 2021.   3.  Colon polyps:  Patient has a personal history  of greater then 10 adenomatous polyps on his most recent conoloscopy. He does not know of any family history of increased polyps or colon cancer.  Genetic testing to assess for the APC mutation for FAP or AFAP was negative. Continue colonoscopies as per GI. 4. Tobacco Use: Chronic and unchanged.  Patient continues to heavily smoke.  He previously expressed understanding by continuing tobacco use increases his chance of recurrence. 5. Anxiety: Chronic and unchanged.  Continue Valium 10 mg every 12 hours as needed.  Continue treatment and evaluation per primary care.  IV Ativan has also been added to his premedication regimen. 6.  Hyponatremia: Chronic and unchanged.  Patient's most recent sodium level was 126.  Patient was counseled on alcohol consumption and how it can act as a diuretic and reduce sodium levels.  Continue salt tablets as per nephrology. 7.  Hypothyroidism: Appreciate endocrinology input.  Continue Synthroid as prescribed.  Follow-up with endocrinology as scheduled. 8.  CHF: Patient has an EF of 40%.  Continue evaluation and treatment per cardiology. 9.  Nausea: Patient does not complain of this today.  Continue Zofran as needed. 10.  C2 cervical fracture: Patient had repeat imaging on May 03, 2022 that revealed complete resolution of fracture.   11.  Cough/shortness of breath: Chronic and unchanged.  Continue treatment for MAC as above.  Patient also continues to smoke.  Patient expressed understanding and was in agreement with this plan. He also understands that He can call clinic at any time with any questions,  concerns, or complaints.    [name], MD   05/05/2022 2:59 PM

## 2022-05-06 ENCOUNTER — Encounter: Payer: Self-pay | Admitting: Infectious Diseases

## 2022-05-06 ENCOUNTER — Ambulatory Visit: Payer: BC Managed Care – PPO | Attending: Infectious Diseases | Admitting: Infectious Diseases

## 2022-05-06 VITALS — BP 125/80 | HR 101 | Temp 97.8°F | Wt 182.0 lb

## 2022-05-06 DIAGNOSIS — E785 Hyperlipidemia, unspecified: Secondary | ICD-10-CM | POA: Diagnosis not present

## 2022-05-06 DIAGNOSIS — D509 Iron deficiency anemia, unspecified: Secondary | ICD-10-CM | POA: Diagnosis not present

## 2022-05-06 DIAGNOSIS — E039 Hypothyroidism, unspecified: Secondary | ICD-10-CM | POA: Diagnosis not present

## 2022-05-06 DIAGNOSIS — Z923 Personal history of irradiation: Secondary | ICD-10-CM | POA: Diagnosis not present

## 2022-05-06 DIAGNOSIS — Z79890 Hormone replacement therapy (postmenopausal): Secondary | ICD-10-CM | POA: Insufficient documentation

## 2022-05-06 DIAGNOSIS — Z79899 Other long term (current) drug therapy: Secondary | ICD-10-CM | POA: Diagnosis not present

## 2022-05-06 DIAGNOSIS — R208 Other disturbances of skin sensation: Secondary | ICD-10-CM | POA: Insufficient documentation

## 2022-05-06 DIAGNOSIS — I509 Heart failure, unspecified: Secondary | ICD-10-CM | POA: Insufficient documentation

## 2022-05-06 DIAGNOSIS — I110 Hypertensive heart disease with heart failure: Secondary | ICD-10-CM | POA: Insufficient documentation

## 2022-05-06 DIAGNOSIS — J449 Chronic obstructive pulmonary disease, unspecified: Secondary | ICD-10-CM | POA: Diagnosis not present

## 2022-05-06 DIAGNOSIS — C3491 Malignant neoplasm of unspecified part of right bronchus or lung: Secondary | ICD-10-CM | POA: Diagnosis not present

## 2022-05-06 DIAGNOSIS — A310 Pulmonary mycobacterial infection: Secondary | ICD-10-CM | POA: Insufficient documentation

## 2022-05-06 DIAGNOSIS — F17210 Nicotine dependence, cigarettes, uncomplicated: Secondary | ICD-10-CM | POA: Diagnosis not present

## 2022-05-06 DIAGNOSIS — Z9221 Personal history of antineoplastic chemotherapy: Secondary | ICD-10-CM | POA: Diagnosis not present

## 2022-05-06 MED ORDER — AZITHROMYCIN 500 MG PO TABS: 500.0000 mg | ORAL_TABLET | Freq: Every day | ORAL | 3 refills | Status: DC

## 2022-05-06 MED ORDER — ETHAMBUTOL HCL 400 MG PO TABS
15.0000 mg/kg | ORAL_TABLET | Freq: Every day | ORAL | 3 refills | Status: DC
Start: 2022-05-06 — End: 2022-10-19

## 2022-05-06 NOTE — Patient Instructions (Signed)
You are here for follow up for MAC- you are on azithromycin and ethambutol and you are tolerating the medicine- labs look good- Continue and follow up 3 months

## 2022-05-06 NOTE — Progress Notes (Signed)
NAME: Paul Carpenter  DOB: November 29, 1955  MRN: [id number]  Date/Time: 05/06/2022 10:53 AM  Subjective:  Patient is here with his wife  Paul Carpenter is a 66 y.o. 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  He did not want to do a rifabutin or rifampin based regimen because of concern for liver disease with alcoholic Patient is doing better His cough is better He still has some burning pain on the right side of the chest especially with coughing No fever Is tolerating the medication well Takes azithromycin 500 mg once a day and ethambutol 1200 mg which she has been taking for 400 mg 3 times a day.  Patient has a complicated lung history As per Dr. [name] note he was initially diagnosed with non-small cell carcinoma of the lung in 2019. On 04/11/2018 a bronch with EBUS done and biopsy taken of subcarinal mass- it showed  - POSITIVE FOR MALIGNANT CELLS IN A NECROTIC BACKGROUND.  MRI of the brain completed on Mar 28, 2018 did not reveal any metastatic disease.  He completed radiation June 26, 2018.  He completed concurrent single agent carboplatinum on June 21, 2018.  He had a reaction to Taxol during cycle 1 and this was discontinued.  He completed a year-long maintenance subdural level lab on June 27, 2019.  In July 2022 there was a concern for slight progression of the disease.  PET scan done on 06/03/2021 revealed a spiculated cavitary mass in the apical segment of the right upper lobe with similar SUV which was 3.5 versus 3.31-year ago.  So this was reassuring as per the radiologist.  However disease progression could not be definitely excluded.   no metastasis seen.   A CT scan done on 07/20/2021 showed continued slow evolution of a thick-walled cavitary mass in the right upper lobe which remains concerning for slow-growing neoplasm.  He had a fall and fractured his C 2 in December , 2022.  He was in [facility] at that time.  CTs chest that was done that showed a large right upper lobe soft tissue mass with similar abnormal tissue involving the right pulmonary hilum extending medially into the ipsilateral mediastinum in keeping with a reported history of primary lung cancer. In January he was hospitalized at [facility] for hyponatremia.  At that time oncologist saw him and got another PET scan.  A bronchoscopy/biopsy could not be done due to C2 fracture.  A PET scan was done on 11/16/2021 and it showed similar size of the cavitary process in the right upper lobe.  Increased size and hypermetabolism compared to the PET on 06/02/2021.  Hypermetabolic pulmonary nodules many of which were new compared to the CAT scan of 10/20/2021.    He was diagnosed to have recurrence and was started on palliative chemo with  taxotere( 4 cycles) on 11/26/21- completed on 01/28/22 He was hospitalized  02/03/22-02/05/22 for sob and cough and fever and sputum sent which was pseudomonas, but realized after his discharge He saw Dr.[name] on 02/09/22 and received a 2 week course of cipro for necrotizing pneumonia with pseudomonas. A repeat PET scan done on 02/23/22 showed Thick-walled cavitary mass in the right upper lobe has decreased in overall hypermetabolism, indicative of treatment response. 2. Multifocal  areas of nodular consolidation and peribronchovascular nodularity with a waxing and waning appearance, suggesting combination slight improvement in metastatic disease with a superimposed atypical/fungal infectious process. 3. Mildly hypermetabolic low right paratracheal and right hilar lymph nodes, metastatic or reactive. 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 and he was referred to me for  treatment   Past Medical History:  Diagnosis Date   Anginal pain ([place])    Anxiety    Asthma    Chest pain    CHF (congestive heart failure) (HCC)    Chicken pox    Complication of anesthesia    o2 dropped after neck fusion   COPD (chronic obstructive pulmonary disease) (HCC)    Coronary artery disease    Cough    chronic  clear phlegm   Dysrhythmia    palpitations   GERD (gastroesophageal reflux disease)    h/o reflux/ hoarsness   Hematochezia    Hemorrhoids    History of chickenpox    History of colon polyps    History of Helicobacter pylori infection    Hoarseness    Hypertension    Lung cancer ([place]) 05/2016   Chemo + rad tx's.    Migraines    OSA (obstructive sleep apnea)    has CPAP but does not use   Personal history of tobacco use, presenting hazards to health 03/05/2016   Pneumonia    5/17   Raynaud disease    Raynaud disease    Raynaud's disease    Rotator cuff tear    on right   Shortness of breath dyspnea    Sleep apnea    Ulcer (traumatic) of oral mucosa     Past Surgical History:  Procedure Laterality Date   BACK SURGERY     cervical fusion x 2   CARDIAC CATHETERIZATION     CERVICAL DISCECTOMY     x 2   COLONOSCOPY     COLONOSCOPY N/A 07/25/2015   Procedure: COLONOSCOPY;  Surgeon: Lollie Sails, MD;  Location: [facility] ENDOSCOPY;  Service: Endoscopy;  Laterality: N/A;   COLONOSCOPY WITH PROPOFOL N/A 10/04/2017   Procedure: COLONOSCOPY WITH PROPOFOL;  Surgeon: Lollie Sails, MD;  Location: [facility] ENDOSCOPY;  Service: Endoscopy;  Laterality: N/A;   COLONOSCOPY WITH PROPOFOL N/A 07/10/2020   Procedure: COLONOSCOPY WITH PROPOFOL;  Surgeon: Toledo, Benay Pike, MD;  Location: [facility] ENDOSCOPY;  Service: Gastroenterology;  Laterality: N/A;   ELECTROMAGNETIC NAVIGATION BROCHOSCOPY Left 06/28/2016   Procedure: ELECTROMAGNETIC NAVIGATION BRONCHOSCOPY;  Surgeon: Vilinda Boehringer, MD;  Location: [facility] ORS;  Service: Cardiopulmonary;  Laterality: Left;   ENDOBRONCHIAL  ULTRASOUND N/A 04/11/2018   Procedure: ENDOBRONCHIAL ULTRASOUND;  Surgeon: [name], MD;  Location: [facility] ORS;  Service: Cardiopulmonary;  Laterality: N/A;   ESOPHAGOGASTRODUODENOSCOPY N/A 07/25/2015   Procedure: ESOPHAGOGASTRODUODENOSCOPY (EGD);  Surgeon: Lollie Sails, MD;  Location: [facility] ENDOSCOPY;  Service: Endoscopy;  Laterality: N/A;   ESOPHAGOGASTRODUODENOSCOPY (EGD) WITH PROPOFOL N/A 07/10/2020   Procedure: ESOPHAGOGASTRODUODENOSCOPY (EGD) WITH PROPOFOL;  Surgeon: Toledo, Benay Pike, MD;  Location: [facility] ENDOSCOPY;  Service: Gastroenterology;  Laterality: N/A;   NASAL SINUS SURGERY     x 2    PORTA CATH INSERTION N/A 04/24/2018   Procedure: PORTA CATH INSERTION;  Surgeon: Algernon Huxley, MD;  Location: [place] CV LAB;  Service: Cardiovascular;  Laterality: N/A;   rotator cuff surgery Right    07/2016   SEPTOPLASTY     SKIN GRAFT      Social History   Socioeconomic History  Marital status: Married    Spouse name: Not on file   Number of children: 3   Years of education: Not on file   Highest education level: Not on file  Occupational History   Occupation: Architect Work  Tobacco Use   Smoking status: Every Day    Packs/day: 3.00    Years: 50.00    Total pack years: 150.00    Types: Cigarettes   Smokeless tobacco: Never   Tobacco comments:    2PPD 04/20/2022  Vaping Use   Vaping Use: Never used  Substance and Sexual Activity   Alcohol use: Yes    Alcohol/week: 2.0 standard drinks of alcohol    Types: 2 Standard drinks or equivalent per week    Comment: moderate   Drug use: No   Sexual activity: Not on file  Other Topics Concern   Not on file  Social History Narrative   Not on file   Social Determinants of Health   Financial Resource Strain: Not on file  Food Insecurity: Not on file  Transportation Needs: Not on file  Physical Activity: Not on file  Stress: Not on file  Social Connections: Not on file  Intimate Partner Violence: Not on file     Family History  Problem Relation Age of Onset   Heart disease Father    Prostate cancer Father    Heart disease Paternal Grandmother    Heart attack Maternal Grandfather 65   Kidney cancer Neg Hx    Bladder Cancer Neg Hx    Other Neg Hx        pituitary abnormality   Allergies  Allergen Reactions   Lisinopril Rash   Varenicline Rash   I? Current Outpatient Medications  Medication Sig Dispense Refill   acetaminophen (TYLENOL) 500 MG tablet Take 500 mg by mouth daily as needed.     albuterol (PROAIR HFA) 108 (90 Base) MCG/ACT inhaler Inhale 2 puffs into the lungs every 4 (four) hours as needed for wheezing or shortness of breath. 18 g 2   albuterol (PROVENTIL) (2.5 MG/3ML) 0.083% nebulizer solution Take 3 mLs (2.5 mg total) by nebulization 4 (four) times daily as needed for wheezing or shortness of breath. 120 mL 2   atorvastatin (LIPITOR) 40 MG tablet Take 40 mg by mouth at bedtime.      azithromycin (ZITHROMAX) 500 MG tablet Take 1 tablet (500 mg total) by mouth daily. 30 tablet 1   Budeson-Glycopyrrol-Formoterol (BREZTRI AEROSPHERE) 160-9-4.8 MCG/ACT AERO Inhale 2 puffs into the lungs in the morning and at bedtime. 5.9 g 0   Budeson-Glycopyrrol-Formoterol (BREZTRI AEROSPHERE) 160-9-4.8 MCG/ACT AERO Inhale 2 puffs into the lungs in the morning and at bedtime. 10.7 g 11   chlorpheniramine-HYDROcodone 10-8 MG/5ML Take 5 mLs by mouth every 12 (twelve) hours as needed for cough. 115 mL 0   diazepam (VALIUM) 5 MG tablet Take 5 mg by mouth at bedtime as needed.     diazepam (VALIUM) 5 MG tablet Take by mouth.     ethambutol (MYAMBUTOL) 400 MG tablet Take 3 tablets (1,200 mg total) by mouth daily. 90 tablet 1   ferrous sulfate 325 (65 FE) MG tablet Take 325 mg by mouth at bedtime.     HYDROcodone-acetaminophen (NORCO/VICODIN) 5-325 MG tablet Take 1-2 tablets by mouth every 4 (four) hours as needed for moderate pain. 30 tablet 0   levothyroxine (SYNTHROID) 150 MCG tablet Take 150 mcg by  mouth daily before breakfast.     lidocaine-prilocaine (EMLA) cream Apply 1  application topically as needed. 30 g 1   loratadine (CLARITIN) 10 MG tablet Take 10 mg by mouth daily.     magnesium oxide (MAG-OX) 400 MG tablet Take 600 mg by mouth 2 (two) times daily. Takes 1.5 tablet twice daily     methylPREDNISolone (MEDROL DOSEPAK) 4 MG TBPK tablet Take as directed in the package. 21 tablet 0   nitroGLYCERIN (NITROSTAT) 0.4 MG SL tablet Place under the tongue.     OXYGEN Inhale 2 L into the lungs at bedtime.     pantoprazole (PROTONIX) 40 MG tablet Take 1 tablet (40 mg total) by mouth 2 (two) times daily before meals     sodium chloride 1 g tablet Take 1 tablet (1 g total) by mouth 3 (three) times daily with meals. 90 tablet 1   tadalafil (CIALIS) 5 MG tablet Take 1 tablet (5 mg total) by mouth daily as needed for erectile dysfunction. 90 tablet 3   traMADol (ULTRAM) 50 MG tablet Take 50 mg by mouth every 8 (eight) hours as needed.     No current facility-administered medications for this visit.     Abtx:  Anti-infectives (From admission, onward)    None       REVIEW OF SYSTEMS:  Const: negative fever, negative chills, negative weight loss Eyes: negative diplopia or visual changes, negative eye pain ENT: negative coryza, negative sore throat Resp: cough, , dyspnea rt sided pleuritic chest pain Cards: negative for chest pain, palpitations, lower extremity edema GU: negative for frequency, dysuria and hematuria GI: Negative for abdominal pain, diarrhea, bleeding, constipation Skin: negative for rash and pruritus Heme: negative for easy bruising and gum/nose bleeding MS: fatigue Neurolo:negative for headaches, dizziness, vertigo, memory problems  Psych:depression  Endocrine: negative for thyroid, diabetes Allergy/Immunology-as above: Objective:  VITALS:  BP 125/80   Pulse (!) 101   Temp 97.8 F (36.6 C) (Temporal)   Wt 182 lb (82.6 kg)   BMI 24.68 kg/m   PHYSICAL EXAM:   General: Alert, cooperative, no  distress at rest,  Head: Normocephalic, without obvious abnormality, atraumatic. Eyes: Conjunctivae clear, anicteric sclerae. Pupils are equal ENT Nares normal. No drainage or sinus tenderness. Lips, mucosa, and tongue normal. No Thrush Neck: Supple, symmetrical, no adenopathy, thyroid: non tender no carotid bruit and no JVD. Back: No CVA tenderness. Lungs: b/l air entry Rhonchi and crepts on rt side Heart: Regular rate and rhythm, no murmur, rub or gallop. Abdomen: Soft, non-tender,not distended. Bowel sounds normal. No masses Extremities: atraumatic, no cyanosis. No edema. No clubbing Skin: No rashes or lesions. Or bruising Lymph: Cervical, supraclavicular normal. Neurologic: Grossly non-focal  Labs    Latest Ref Rng & Units 05/03/2022    2:24 PM 03/31/2022    1:58 PM 03/02/2022    3:03 PM  CBC  WBC 4.0 - 10.5 K/uL 9.6  12.3  14.2   Hemoglobin 13.0 - 17.0 g/dL 12.8  12.7  11.5   Hematocrit 39.0 - 52.0 % 38.2  38.6  36.2   Platelets 150 - 400 K/uL 223  311  365        Latest Ref Rng & Units 05/03/2022    2:24 PM 03/31/2022    1:58 PM 03/02/2022    3:03 PM  CMP  Glucose 70 - 99 mg/dL 104  118  115   BUN 8 - 23 mg/dL _0 Creatinine 0.61 - 1.24 mg/dL 0.61  0.76  0.59   Sodium 135 - 145 mmol/L 126  129  130   Potassium 3.5 - 5.1 mmol/L 3.9  3.8  4.1   Chloride 98 - 111 mmol/L 95  94  94   CO2 22 - 32 mmol/L _0 Calcium 8.9 - 10.3 mg/dL 8.4  8.5  8.9   Total Protein 6.5 - 8.1 g/dL 6.6  6.6  7.0   Total Bilirubin 0.3 - 1.2 mg/dL 0.3  0.4  0.5   Alkaline Phos 38 - 126 U/L 94  100  102   AST 15 - 41 U/L _1 ALT 0 - 44 U/L _2 IMAGING RESULTS:  I have personally reviewed the films  ? Impression/Recommendation 66 year old male current smoker and alcohol use but recurrent non-small cell CA lung on the right side.  In 2019 had undergone chemo, radiation and immunotherapy Since January 2023 he had taken 4 cycles  of palliative chemotherapy. No biopsy was taken the second time around because of C2 fracture  Neck cavitary/necrotizing mass on the right upper lobe/middle lobe This is a combination of malignancy/MACinfection and with underlying COPD and radiation damage. He was recently treated for Pseudomonas infection with ciprofloxacin for 2 weeks and got better 3 samples of sputum has MAC  If so he will need treatment for that with azithromycin 500 mg daily 1200 mg daily, ethambutol   He wants to try 2 drug regimen first which is alright and will avoid rifampin , to protect liver Has been taken medication for the past 3 weeks.  Tolerated well.  Hyperlipidemia on atorvastatin.   COPD  Hypothyroidism on Synthroid   follow-up 3 months.  We will repeat sputum AFB then __________________________________________________  Note:  This document was prepared using Dragon voice recognition software and may include unintentional dictation errors.

## 2022-05-07 ENCOUNTER — Ambulatory Visit: Payer: BC Managed Care – PPO | Admitting: Radiation Oncology

## 2022-05-12 ENCOUNTER — Inpatient Hospital Stay: Payer: BC Managed Care – PPO

## 2022-05-12 DIAGNOSIS — Z95828 Presence of other vascular implants and grafts: Secondary | ICD-10-CM

## 2022-05-12 DIAGNOSIS — C342 Malignant neoplasm of middle lobe, bronchus or lung: Secondary | ICD-10-CM | POA: Diagnosis not present

## 2022-05-12 MED ORDER — HEPARIN SOD (PORK) LOCK FLUSH 100 UNIT/ML IV SOLN
500.0000 [IU] | Freq: Once | INTRAVENOUS | Status: AC
  Administered 2022-05-12: 500 [IU] via INTRAVENOUS
  Filled 2022-05-12: qty 5

## 2022-05-12 MED ORDER — SODIUM CHLORIDE 0.9% FLUSH
10.0000 mL | Freq: Once | INTRAVENOUS | Status: AC
  Administered 2022-05-12: 10 mL via INTRAVENOUS
  Filled 2022-05-12: qty 10

## 2022-05-13 ENCOUNTER — Ambulatory Visit (INDEPENDENT_AMBULATORY_CARE_PROVIDER_SITE_OTHER): Payer: BC Managed Care – PPO | Admitting: Neurosurgery

## 2022-05-13 DIAGNOSIS — S12101D Unspecified nondisplaced fracture of second cervical vertebra, subsequent encounter for fracture with routine healing: Secondary | ICD-10-CM | POA: Diagnosis not present

## 2022-05-13 NOTE — Progress Notes (Addendum)
Neurosurgery Telephone (Audio-Only) Note  Requesting Provider     [name], MD [address] [place],  [place] [address] T: [phone] F: [phone]  Primary Care Provider [name], [address] [place] [address] T: [phone] F: [phone]  Telehealth visit was conducted with [name], a 66 y.o. male via telephone.  Patient location: Home  Provider location: Medical Office    History of Present Illness: Mr. Delcid is 66 y.o presenting today for telephone follow-up to review his CT and MRI of the cervical spine.  He continues to have pain that radiates into his intrascapular region and chest soreness as well as pain that radiates down his right arm.  He continues to have any left-sided symptoms.  This is largely unchanged from his visit last month.  LOV 04/21/22 Harley Fitzwater is a 66 y.o. male who is known to our clinic for C2 fracture. He states he has had an increase in neck pain and interscapular pain over the last 3 to 4 weeks without any particular inciting event. The pain radiates and causes burning/shooting pain down the inside of his right arm to the elbow. He also describes some pain in his right chest wall. He is currently seeing pulmonology for Mycobacterium avium infection and being treated for this by infectious disease. He feels that some of his chest wall symptoms could be result of this but given his history of fracture was concerned that he had additional displacement. He denies any symptoms concerning for myelopathy.  12/10/21 [place] is a 66 y.o. male who presents today for hospital follow up. He was initially seen at [facility] on 10/28/21 and treatment conservatively in a c-collar. He presented to [facility] on 11/05/21 for hyponatremia neurosurgery was consulted for cervical collar recommendations. It was recommended that he remain in a hard cervical collar when out of bed and  upright but could transition to a soft collar with low risk activities.  Today he reports no neck pain or radiating arm pain. He is continue to wear his cervical collar when in the car but has otherwise been without it. He is tolerating this well.  11/12/2021 Mr. Jasir Rother is here today for 1 week f/u of C2 fracture. He was recently hospitalized for hyponatremia after a fall and was seen by Dr. Cari Caraway on 11/05/2021. It was recommended that he be treated conservatively in a cervical collar and follow-up this week. He does report some soreness in his neck although he states this was present prior to his fall and fracture. He reports compliance with his cervical collar. He denies any radiating arm symptoms. He is concerned regarding an upcoming PET scan and subsequent bronchoscopy with possible biopsy she does have a history of small cell lung cancer. He otherwise denies any neurologic concerns.   General Review of Systems:  A ROS was performed including pertinent positive and negatives as documented.  All other systems are negative.   Prior to Admission medications   Medication Sig Start Date End Date Taking? Authorizing Provider  acetaminophen (TYLENOL) 500 MG tablet Take 500 mg by mouth daily as needed.    [provider]  albuterol (PROAIR HFA) 108 (90 Base) MCG/ACT inhaler Inhale 2 puffs into the lungs every 4 (four) hours as needed for wheezing or shortness of breath. 02/09/22   Tyler Pita, MD  albuterol (PROVENTIL) (2.5 MG/3ML) 0.083% nebulizer solution Take 3 mLs (2.5 mg total) by nebulization 4 (four) times  daily as needed for wheezing or shortness of breath. 02/09/22 02/09/23  Tyler Pita, MD  atorvastatin (LIPITOR) 40 MG tablet Take 40 mg by mouth at bedtime.  03/30/13   [provider]  azithromycin (ZITHROMAX) 500 MG tablet Take 1 tablet (500 mg total) by mouth daily. 05/06/22   Tsosie Billing, MD  Budeson-Glycopyrrol-Formoterol (BREZTRI AEROSPHERE)  160-9-4.8 MCG/ACT AERO Inhale 2 puffs into the lungs in the morning and at bedtime. 04/20/22   Tyler Pita, MD  Budeson-Glycopyrrol-Formoterol (BREZTRI AEROSPHERE) 160-9-4.8 MCG/ACT AERO Inhale 2 puffs into the lungs in the morning and at bedtime. 04/20/22   Tyler Pita, MD  chlorpheniramine-HYDROcodone 10-8 MG/5ML Take 5 mLs by mouth every 12 (twelve) hours as needed for cough. 03/23/22   [name], MD  diazepam (VALIUM) 5 MG tablet Take 5 mg by mouth at bedtime as needed. 11/30/21   [provider]  diazepam (VALIUM) 5 MG tablet Take by mouth. 04/04/22   [provider]  ethambutol (MYAMBUTOL) 400 MG tablet Take 3 tablets (1,200 mg total) by mouth daily. 05/06/22   Tsosie Billing, MD  ferrous sulfate 325 (65 FE) MG tablet Take 325 mg by mouth at bedtime.    [provider]  HYDROcodone-acetaminophen (NORCO/VICODIN) 5-325 MG tablet Take 1-2 tablets by mouth every 4 (four) hours as needed for moderate pain. 02/05/22 02/05/23  Lorella Nimrod, MD  levothyroxine (SYNTHROID) 150 MCG tablet Take 150 mcg by mouth daily before breakfast.    [provider]  lidocaine-prilocaine (EMLA) cream Apply 1 application topically as needed. 09/30/21   [name], MD  loratadine (CLARITIN) 10 MG tablet Take 10 mg by mouth daily.    [provider]  magnesium oxide (MAG-OX) 400 MG tablet Take 600 mg by mouth 2 (two) times daily. Takes 1.5 tablet twice daily    [provider]  methylPREDNISolone (MEDROL DOSEPAK) 4 MG TBPK tablet Take as directed in the package. 04/20/22   Tyler Pita, MD  nitroGLYCERIN (NITROSTAT) 0.4 MG SL tablet Place under the tongue. 04/07/21   [provider]  OXYGEN Inhale 2 L into the lungs at bedtime.    [provider]  pantoprazole (PROTONIX) 40 MG tablet Take 1 tablet (40 mg total) by mouth 2 (two) times daily before meals 02/15/22   [provider]  sodium chloride 1 g tablet Take  1 tablet (1 g total) by mouth 3 (three) times daily with meals. 09/22/21   Annita Brod, MD  tadalafil (CIALIS) 5 MG tablet Take 1 tablet (5 mg total) by mouth daily as needed for erectile dysfunction. 10/15/21   Zara Council A, PA-C  traMADol (ULTRAM) 50 MG tablet Take 50 mg by mouth every 8 (eight) hours as needed. 02/15/22   [provider]  ipratropium-albuterol (DUONEB) 0.5-2.5 (3) MG/3ML SOLN Take 3 mLs by nebulization every 6 (six) hours as needed. 01/22/22 02/09/22  Borders, Kirt Boys, NP  levalbuterol (XOPENEX HFA) 45 MCG/ACT inhaler Inhale 1 puff into the lungs every 6 (six) hours as needed for wheezing. 11/19/21 02/09/22  [name], MD    DATA REVIEWED    Imaging Studies  MRI C-spine 05/03/22 IMPRESSION: No evidence of nonunited or displaced fracture. There could possibly have been a minimal fracture of the tip of the dens but there seems to be complete healing without malalignment.   C2-3 facet arthropathy on the left. Left foraminal stenosis could affect the left C3 nerve and could be a cause of regional pain.  C4-5 facet arthropathy on the left. Left foraminal stenosis could affect the left C5 nerve and could be a cause of regional pain.   Previous ACDF C3-4 and from C5 through C7 with solid union and sufficient patency of the canal. Mild foraminal narrowing on the left at C3-4.   C7-T1 spondylosis with bilateral foraminal encroachment that could possibly affect either C8 nerve. Definite C8 nerve compression is not demonstrated.     Electronically Signed   By: Nelson Chimes M.D.   On: 05/05/2022 08:36  CT C-spine 05/03/22 ADDENDUM: There is no evidence of displaced or nonunited fracture. I do not see any morphologic features of C2 by CT that would allow me to diagnose a prior fracture. See results of MRI.     Electronically Signed   By: Nelson Chimes M.D.   On: 05/05/2022 08:37 Laboratory Studies  None   IMPRESSION  Mr. Stooksbury is a 66 y.o.  male who I performed a telephone encounter today for evaluation and management of: neck and intrascapular pain with a history of a C2 fracture  PLAN  Tobiah Celestine is a 66 y.o with a history of lung cancer and a C2 fracture presenting today via telephone visit to review his imaging.  Fortunately his CT fracture is healed however he does have degenerative changes cervical spine is may explain his radiating right arm pain.  We discussed the possibility of injections and medication management he would like to give some thought to this before making a decision.  He will let us know should he decide to move forward with this and we will put in the appropriate referral.  I will otherwise see him going forward on an as-needed basis.  He expressed understanding was in agreement with this plan.  No orders of the defined types were placed in this encounter.   DISPOSITION  Follow up: In person appointment in  PRN  Loleta Dicker, PA Neurosurgery   TELEPHONE DOCUMENTATION   I spent a total of 10 minutes for this visit on the date of this encounter.

## 2022-06-01 ENCOUNTER — Other Ambulatory Visit
Admission: RE | Admit: 2022-06-01 | Discharge: 2022-06-01 | Disposition: A | Discharge status: Home / Self Care | Payer: BC Managed Care – PPO | Source: Ambulatory Visit | Attending: Nephrology | Admitting: Nephrology

## 2022-06-01 ENCOUNTER — Encounter: Payer: Self-pay | Admitting: Oncology

## 2022-06-01 DIAGNOSIS — E871 Hypo-osmolality and hyponatremia: Secondary | ICD-10-CM | POA: Insufficient documentation

## 2022-06-01 LAB — BASIC METABOLIC PANEL
Anion gap: 10 (ref 5–15)
BUN: 6 mg/dL — ABNORMAL LOW (ref 8–23)
CO2: 27 mmol/L (ref 22–32)
Calcium: 9 mg/dL (ref 8.9–10.3)
Chloride: 98 mmol/L (ref 98–111)
Creatinine, Ser: 0.77 mg/dL (ref 0.61–1.24)
GFR, Estimated: >60 mL/min (ref >60)
Glucose, Bld: 107 mg/dL — ABNORMAL HIGH (ref 70–99)
Potassium: 4.4 mmol/L (ref 3.5–5.1)
Sodium: 135 mmol/L (ref 135–145)

## 2022-06-10 ENCOUNTER — Encounter: Payer: Self-pay | Admitting: Oncology

## 2022-06-10 ENCOUNTER — Other Ambulatory Visit (HOSPITAL_COMMUNITY): Payer: Self-pay

## 2022-06-10 ENCOUNTER — Telehealth: Payer: Self-pay | Admitting: Pulmonary Disease

## 2022-06-10 MED ORDER — BREZTRI AEROSPHERE 160-9-4.8 MCG/ACT IN AERO: 2.0000 | INHALATION_SPRAY | Freq: Two times a day (BID) | RESPIRATORY_TRACT | 0 refills | Status: DC

## 2022-06-10 NOTE — Telephone Encounter (Signed)
Spoke to patient.  He stated that he has a new insurance and Judithann Sauger is no longer covered and he can not use Breztri coupon.  One sample of Brezti has been placed up front for pickup.  New insurance policy numbers are listed below [place]  EV:[id number] group number:[id number] Medicare: [id number]   Pharmacy team, can you guys run a test claim. Thanks

## 2022-06-10 NOTE — Telephone Encounter (Signed)
Patient is aware of below message and voiced his understanding.  Nothing further needed.   

## 2022-06-16 ENCOUNTER — Telehealth: Payer: Medicare Other | Admitting: Nurse Practitioner

## 2022-06-16 ENCOUNTER — Ambulatory Visit (INDEPENDENT_AMBULATORY_CARE_PROVIDER_SITE_OTHER): Payer: Medicare Other | Admitting: Pulmonary Disease

## 2022-06-16 ENCOUNTER — Encounter: Payer: Self-pay | Admitting: Pulmonary Disease

## 2022-06-16 VITALS — BP 120/74 | HR 106 | Temp 98.1°F | Ht 72.0 in | Wt 176.4 lb

## 2022-06-16 DIAGNOSIS — A310 Pulmonary mycobacterial infection: Secondary | ICD-10-CM | POA: Diagnosis not present

## 2022-06-16 DIAGNOSIS — F17210 Nicotine dependence, cigarettes, uncomplicated: Secondary | ICD-10-CM

## 2022-06-16 DIAGNOSIS — C3491 Malignant neoplasm of unspecified part of right bronchus or lung: Secondary | ICD-10-CM | POA: Diagnosis not present

## 2022-06-16 MED ORDER — METHYLPREDNISOLONE 4 MG PO TBPK: ORAL_TABLET | ORAL | 0 refills | Status: DC

## 2022-06-16 MED ORDER — BREZTRI AEROSPHERE 160-9-4.8 MCG/ACT IN AERO: 2.0000 | INHALATION_SPRAY | Freq: Two times a day (BID) | RESPIRATORY_TRACT | 0 refills | Status: DC

## 2022-06-16 NOTE — Progress Notes (Signed)
Subjective:    Patient ID: Paul Carpenter, male    DOB: 01-02-1956, 66 y.o.   MRN: [id number] Patient Care Team: [name], MD as PCP - General (Internal Medicine) [name], MD as Referring Physician (Vascular Surgery) [name], MD as Consulting Physician (Oncology) [name], MD as Referring Physician (Radiation Oncology)  Chief Complaint  Patient presents with   Acute Visit    HPI Patient is a 66 year old current smoker (1.5 PPD, 150 PY) who presents for an acute visit today.  Recall that he has been followed for a cavitary right upper lobe process in the setting of history of non-small cell carcinoma of the lung, with recent recurrence.  The patient's primary pulmonologist is Dr. [name].  Patient was last seen by Dr. [name] on 19 November 2021 at that time the patient had been noted to have recurrence of stage IIIa non-small cell lung cancer of the right middle lobe.  It was suspicion of progression of disease on his most recent PET/CT of 16 November 2021.  Patient at that time could not undergo procedure such as bronchoscopy due to recent C2 cervical fracture.  He underwent chemotherapy with Taxotere every 3 weeks for 4 cycles.  On the day of April he was admitted for observation due to acute on chronic hypoxemic respiratory failure, at that time patient was having issues with fever up to 102.4 and purulent sputum production.  A CT of the chest at that time was performed to assess for PE.  This showed progressive multi cavitary lesion occupying a good portion of the right upper lobe and enlarging right infrahilar mediastinal lymph nodes and several small solid pulmonary nodules.  There was also patchy nodular airspace disease with surrounding tree-in-bud appearance (this has been present since as far back as 2016) there are also severe underlying emphysematous changes.  Because of these findings MAC was suspected and infectious disease and pulmonary disease  consultants were asked to render opinion.  The patient was seen by Dr. [name] and by Dr. [name].  The patient requested early discharge home and was sent home on Augmentin and azithromycin for suspected community-acquired pneumonia and possible MAC. Patient was discharged the following day.  He presents on 09 February 2022 for consideration of bronchoscopy and I evaluated him at that time.  At that time he was still coughing copious amounts of thick green sputum and he continued to have issues with malaise and right-sided pleuritic chest pain.  Since his discharge microbiology has been resulted and the patient is growing Pseudomonas aeruginosa.  Patient at that time was treated with Cipro and his symptoms improved.  On subsequent visit on 20 April 2022 his sputum had cleared however he still had significant wheezing he was given a Medrol Dosepak and Trelegy was switched to [place] 2 puffs twice a day.  He was also instructed and counseled with regards to discontinuation of smoking and limiting his alcohol intake to just 1 drink a day.  After his April visit the patient was seen by oncology to discuss a PET/CT and after discussion with [name], NP it was decided to recollect sputum to ensure that he had clear of the Pseudomonas.  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  He has been referred to infectious diseases.  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  He presents today as an acute visit.  He notices sharp "like electricity" pains all throughout his body particularly around his chest.  He does not feel that the inhalers or nebulizers are working.  He has started expectorating green copious amounts of sputum again.  No hemoptysis.  He does not describe any fevers, chills or sweats.  He is evasive as to how much alcohol he is drinking currently.  He had a PET/CT on 29 June that showed that the area of cavitation on the right upper lobe was showing decreased metabolic activity when compared  to the prior study.  There was only mild uptake in the periphery.  There were also areas of metabolic activity that could be consistent with chronic multifocal fungal or other infection.  These were showing waxing and waning appearance.  Close attention to follow-up was recommended.  As noted the patient continues to smoke, he does wear oxygen nocturnally but has been noncompliant with CPAP for obstructive sleep apnea.  He does have nocturnal oxygen desaturations per his wife.  He is on Breztri 2 puffs twice a day and albuterol via nebulizer as needed.  He does note again that his secretions are very thick and copious.   He does not endorse any other symptomatology.  Results of his microbiology   Review of Systems A 10 point review of systems was performed and it is as noted above otherwise negative.  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/2021   Overweight (BMI 25.0-29.9) 09/22/2021   Hypomagnesemia 09/21/2021   Elevated hemoglobin A1c 06/19/2020   Hypothyroidism, acquired 05/30/2019   Squamous cell lung cancer, right (HCC) 05/30/2019   HFrEF (heart failure with reduced ejection fraction) (HCC) 03/06/2019   Atherosclerosis 12/14/2018   Non-small cell lung cancer, right (HCC) 04/24/2018   Oral ulcer 02/16/2018   Pituitary disorder (HCC) 02/16/2018   Migraine headache 03/07/2017   HCAP (healthcare-associated pneumonia) 09/11/2016   COPD exacerbation (HCC) 09/11/2016   Hyponatremia 09/11/2016   Leukocytosis 09/11/2016   Thrombocytopenia (HCC) 09/11/2016   Sepsis (HCC) 09/09/2016   Cigarette smoker 06/11/2016   Cervical radiculopathy 04/15/2016   Cervical disc disorder at C5-C6 level with radiculopathy 03/09/2016   Impingement syndrome of right shoulder 03/09/2016   Personal history of tobacco use, presenting  hazards to health 03/05/2016   Health care maintenance 09/29/2015   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   Benign essential hypertension 05/28/2015   Polycythemia 03/24/2015   Carotid artery disease (HCC) 12/12/2014   Disequilibrium 12/12/2014   Mixed hyperlipidemia 12/10/2014   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 headache (migraine equivalents) 08/12/2014   Incomplete emptying of bladder 06/04/2014   Anxiety 05/18/2014   Chronic coronary artery disease 05/18/2014   Chronic headaches 05/18/2014   Acute shoulder pain 03/15/2014   Impingement syndrome of left shoulder 03/15/2014   Lung mass 12/06/2013   Kidney stone 11/24/2013   COPD (chronic obstructive pulmonary disease) (HCC) 04/24/2013   Tobacco use disorder 04/24/2013   Obstructive sleep apnea 04/24/2013   Benign localized prostatic hyperplasia with lower urinary tract symptoms (LUTS) 07/04/2012   Encounter for long-term current use of medication 07/04/2012   ED (erectile dysfunction) of organic origin 07/04/2012   Testicular hypofunction 07/04/2012   Social History   Tobacco Use   Smoking status: Every Day    Packs/day: 3.00    Years: 50.00    Total pack years: 150.00    Types: Cigarettes   Smokeless tobacco: Never   Tobacco comments:    Smoking a pack  and a half a day.  Trying to cut back.  06/16/2022 hfb  Substance Use Topics   Alcohol use: Yes    Alcohol/week: 2.0 standard drinks of alcohol    Types: 2 Standard drinks or equivalent per week    Comment: moderate   Allergies  Allergen Reactions   Lisinopril Rash   Varenicline Rash   Current Meds  Medication Sig   acetaminophen (TYLENOL) 500 MG tablet Take 500 mg by mouth daily as needed.   albuterol (PROAIR HFA) 108 (90 Base) MCG/ACT inhaler Inhale 2 puffs into the lungs every 4 (four) hours as needed for wheezing or shortness of breath.   albuterol (PROVENTIL) (2.5 MG/3ML) 0.083% nebulizer solution Take 3 mLs (2.5 mg total) by nebulization 4 (four) times daily as needed for wheezing or  shortness of breath.   atorvastatin (LIPITOR) 40 MG tablet Take 40 mg by mouth at bedtime.    azithromycin (ZITHROMAX) 500 MG tablet Take 1 tablet (500 mg total) by mouth daily.   Budeson-Glycopyrrol-Formoterol (BREZTRI AEROSPHERE) 160-9-4.8 MCG/ACT AERO Inhale 2 puffs into the lungs in the morning and at bedtime.   Budeson-Glycopyrrol-Formoterol (BREZTRI AEROSPHERE) 160-9-4.8 MCG/ACT AERO Inhale 2 puffs into the lungs in the morning and at bedtime.   diazepam (VALIUM) 5 MG tablet Take 5 mg by mouth at bedtime as needed.   diazepam (VALIUM) 5 MG tablet Take by mouth.   ethambutol (MYAMBUTOL) 400 MG tablet Take 3 tablets (1,200 mg total) by mouth daily.   ferrous sulfate 325 (65 FE) MG tablet Take 325 mg by mouth at bedtime.   levothyroxine (SYNTHROID) 150 MCG tablet Take 150 mcg by mouth daily before breakfast.   lidocaine-prilocaine (EMLA) cream Apply 1 application topically as needed.   loratadine (CLARITIN) 10 MG tablet Take 10 mg by mouth daily.   magnesium oxide (MAG-OX) 400 MG tablet Take 600 mg by mouth 2 (two) times daily. Takes 1.5 tablet twice daily   nitroGLYCERIN (NITROSTAT) 0.4 MG SL tablet Place under the tongue.   OXYGEN Inhale 2 L into the lungs at bedtime.   pantoprazole (PROTONIX) 40 MG tablet Take 1 tablet (40 mg total) by mouth 2 (two) times daily before meals   sodium chloride 1 g tablet Take 1 tablet (1 g total) by mouth 3 (three) times daily with meals.   tadalafil (CIALIS) 5 MG tablet Take 1 tablet (5 mg total) by mouth daily as needed for erectile dysfunction.   traMADol (ULTRAM) 50 MG tablet Take 50 mg by mouth every 8 (eight) hours as needed.   Immunization History  Administered Date(s) Administered   Fluad Quad(high Dose 65+) 09/22/2020   Influenza Inj Mdck Quad Pf 09/06/2016, 10/02/2019   Influenza Split 08/05/2013, 07/23/2014, 08/18/2015   Influenza Whole 08/01/2012   Influenza,inj,Quad PF,6+ Mos 08/14/2018   Influenza-Unspecified 09/06/2016, 08/17/2017,  09/09/2021   Zoster Recombinat (Shingrix) 06/19/2020       Objective:   Physical Exam BP 120/74 (BP Location: Left Arm, Patient Position: Sitting, Cuff Size: Large)   Pulse (!) 106   Temp 98.1 F (36.7 C) (Oral)   Ht 6' (1.829 m)   Wt 176 lb 6.4 oz (80 kg)   SpO2 98%   BMI 23.92 kg/m  GENERAL: Debilitated appearing gentleman, well nourished.  Mild tachypnea at rest.  Fully ambulatory.  No conversational dyspnea.  Follow appearance. HEAD: Normocephalic, atraumatic.  EYES: Pupils equal, round, reactive to light.  No scleral icterus.  MOUTH: Oral mucosa moist.  No thrush. NECK: Supple. No thyromegaly. Trachea  midline. No JVD.  No adenopathy. PULMONARY: Good air entry bilaterally.  Loud rhonchi throughout particularly at the right base.  Rare wheezes. CARDIOVASCULAR: S1 and S2. Regular rate and rhythm.  No rubs, murmurs or gallops heard. ABDOMEN: Benign. MUSCULOSKELETAL: No joint deformity, no clubbing, no edema.  NEUROLOGIC: Mild psychomotor retardation.  No overt focal deficit.  No gait disturbance.  Speech is fluent. SKIN: Intact,warm,dry. PSYCH: Depressed mood and affect.    Results for orders placed or performed in visit on 03/18/22  Acid Fast Smear+Cx/Rflx, Complete     Status: Abnormal   Collection Time: 03/18/22 11:59 AM  Result Value Ref Range Status   Acid Fast Culture Positive (A)  Final    Comment: (NOTE) Acid-fast bacilli have been detected in culture at 1 week; see AFB Organism ID by DNA probe 04/06/22 1:44am reported results to Jiles Harold. AM Performed At: [facility] [address] [place], [place] [id number] Jolene Schimke MD [id number]    Source of Sample SPUTUM  Final    Comment: Performed at [facility], [address] [place], [place] [address]  Acid Fast Smear+Cx/Rflx, Complete     Status: Abnormal   Collection Time: 03/18/22 11:59 AM  Result Value Ref Range Status   Acid Fast Culture Positive (A)  Final    Comment:  (NOTE) Acid-fast bacilli have been detected in culture at 1 week; see AFB Organism ID by DNA probe Performed At: [facility] [address] [place], [place] [id number] Jolene Schimke MD QJ:[id number]    Source of Sample SPUTUM  Final    Comment: Performed at [facility], [address] [place], [place] [address]  Acid Fast Smear (AFB)     Status: None   Collection Time: 03/18/22 11:59 AM  Result Value Ref Range Status   AFB Specimen Processing Concentration  Final   Acid Fast Smear Negative  Final    Comment: (NOTE) Performed At: [facility] [address] [place], [place] [id number] Jolene Schimke MD MF:[id number]   AFB Organism ID By DNA Probe     Status: Abnormal   Collection Time: 03/18/22 11:59 AM  Result Value Ref Range Status   M tuberculosis complex Negative  Final   M avium complex Positive (A)  Final   M [place]ii Not Indicated  Final   M gordonae Not Indicated  Final   Other: NAIDN  Final    Comment: No additional identification testing is necessary.   Susceptibility Testing See reflex.  Final    Comment: (NOTE) Performed At: [facility] [address] [place], [place] [id number] Jolene Schimke MD YK:[id number]   AFB Organism ID By DNA Probe     Status: Abnormal   Collection Time: 03/18/22 11:59 AM  Result Value Ref Range Status   M tuberculosis complex Negative  Final   M avium complex Positive (A)  Final   M k[place]ii Not Indicated  Final   M gordonae Not Indicated  Final    Comment: (NOTE) Performed At: [facility] [address] [place], [place] [id number] Jolene Schimke MD [id number]    OtherKatherina Right  Final    Comment: No additional identification testing is necessary.   Susceptibility Testing REFERT  Corrected    Comment: (NOTE) Please refer to the following specimen for additional lab results. Susceptibility testing performed under accession #[id number] CORRECTED ON 05/30 AT 1035: PREVIOUSLY  REPORTED AS See reflex.   MAC Susceptibility Broth     Status: Abnormal   Collection Time:  03/18/22 11:59 AM  Result Value Ref Range Status   Organism ID Comment (A)  Final    Comment: Mycobacterium avium complex   Amikacin Comment  Final    Comment: (NOTE) 16.0 ug/mL Susceptible Breakpoints have been established for only some organism-drug combinations as indicated. This test was developed and its performance characteristics determined by [facility]. It has not been cleared or approved by the Food and Drug Administration.    Ciprofloxacin >8.0 ug/mL  Final    Comment: (NOTE) Breakpoints have been established for only some organism-drug combinations as indicated. This test was developed and its performance characteristics determined by [facility]. It has not been cleared or approved by the Food and Drug Administration.    Clarithromycin 2.0 ug/mL Susceptible  Final    Comment: (NOTE) Breakpoints have been established for only some organism-drug combinations as indicated. This test was developed and its performance characteristics determined by [facility]. It has not been cleared or approved by the Food and Drug Administration.    Clofazimine NOT PERFORMED  Final    Comment: (NOTE) Test not performed Breakpoints have been established for only some organism-drug combinations as indicated. This test was developed and its performance characteristics determined by [facility]. It has not been cleared or approved by the Food and Drug Administration.    Doxycycline >8.0 ug/mL  Final    Comment: (NOTE) Breakpoints have been established for only some organism-drug combinations as indicated. This test was developed and its performance characteristics determined by [facility]. It has not been cleared or approved by the Food and Drug Administration.    Linezolid 32.0 ug/mL Resistant  Final    Comment: (NOTE) Breakpoints have been established for only some organism-drug combinations as  indicated. This test was developed and its performance characteristics determined by [facility]. It has not been cleared or approved by the Food and Drug Administration.    Minocycline >8.0 ug/mL  Final    Comment: (NOTE) Breakpoints have been established for only some organism-drug combinations as indicated. This test was developed and its performance characteristics determined by [facility]. It has not been cleared or approved by the Food and Drug Administration.    Moxifloxacin >4.0 ug/mL Resistant  Final    Comment: (NOTE) Breakpoints have been established for only some organism-drug combinations as indicated. This test was developed and its performance characteristics determined by [facility]. It has not been cleared or approved by the Food and Drug Administration.    Rifabutin 0.5 ug/mL  Final    Comment: (NOTE) Breakpoints have been established for only some organism-drug combinations as indicated. This test was developed and its performance characteristics determined by [facility]. It has not been cleared or approved by the Food and Drug Administration.    Rifampin >4.0 ug/mL  Final    Comment: (NOTE) Breakpoints have been established for only some organism-drug combinations as indicated. This test was developed and its performance characteristics determined by [facility]. It has not been cleared or approved by the Food and Drug Administration.    Streptomycin 32.0 ug/mL  Final    Comment: (NOTE) Breakpoints have been established for only some organism-drug combinations as indicated. This test was developed and its performance characteristics determined by [facility]. It has not been cleared or approved by the Food and Drug Administration.    Trimethoprim/Sulfa 2/38 ug/mL  Final    Comment: (NOTE) Breakpoints have been established for only some organism-drug combinations as indicated. This test was developed and its performance characteristics determined by [facility]. It has not  been  cleared or approved by the Food and Drug Administration. Performed At: [facility] 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 Squamous cell lung cancer, right [facility])  C34.91 Ambulatory referral to Hematology / Oncology   Place that he to his management Follows with oncology (Dr. Orlie Dakin)    3. Tobacco dependence due to cigarettes  F17.210    Patient counseled regards to discontinuation of smoking Total counseling time 3-5 5 minutes      Orders Placed This Encounter  Procedures   Respiratory or Resp and Sputum Culture    Standing Status:   Future    Standing Expiration Date:   06/17/2023   AFB culture with smear    Standing Status:   Future    Standing Expiration Date:   06/17/2023   Ambulatory referral to Hematology / Oncology    Referral Priority:   Routine    Referral Type:   Consultation    Referral Reason:   Specialty Services Required    Requested Specialty:   Oncology    Number of Visits Requested:   1   Meds ordered this encounter  Medications   methylPREDNISolone (MEDROL DOSEPAK) 4 MG TBPK tablet    Sig: Take as directed in the package.    Dispense:  21 tablet    Refill:  0   Budeson-Glycopyrrol-Formoterol (BREZTRI AEROSPHERE) 160-9-4.8 MCG/ACT AERO    Sig: Inhale 2 puffs into the lungs in the morning and at bedtime.    Dispense:  5.9 g    Refill:  0    Order Specific Question:   Lot Number?    Answer:   [id number]    Order Specific Question:   Expiration Date?    Answer:   12/01/2024    Order Specific Question:   Manufacturer?    Answer:   [employer] [[id number]]    Order Specific Question:   Quantity    Answer:   1   Patient has multiple complaints of pain that are  not related to his current infectious process.  I have recommended that he touch base with Elouise Munroe, NP with palliative care at the cancer center.  He is to keep his 29 August follow-up as previously scheduled.  He is to contact us prior to that time should any new difficulties arise.  Gailen Shelter, MD Advanced Bronchoscopy PCCM [facility]    *This note was dictated using voice recognition software/Dragon.  Despite best efforts to proofread, errors can occur which can change the meaning. Any transcriptional errors that result from this process are unintentional and may not be fully corrected at the time of dictation.

## 2022-06-16 NOTE — Patient Instructions (Signed)
We are going to collect a sputum specimen.  I have sent in a prescription for a Medrol Dosepak.  We will send a consult to Billey Chang, NP with palliative care at the cancer center.  We will see you in follow-up on 29 August as previously scheduled.

## 2022-06-16 NOTE — Progress Notes (Signed)
Had 2 [place] vaccines

## 2022-06-17 ENCOUNTER — Other Ambulatory Visit
Admission: RE | Admit: 2022-06-17 | Discharge: 2022-06-17 | Disposition: A | Discharge status: Home / Self Care | Payer: Medicare Other | Source: Ambulatory Visit | Attending: Pulmonary Disease | Admitting: Pulmonary Disease

## 2022-06-17 DIAGNOSIS — A310 Pulmonary mycobacterial infection: Secondary | ICD-10-CM | POA: Insufficient documentation

## 2022-06-17 LAB — EXPECTORATED SPUTUM ASSESSMENT W GRAM STAIN, RFLX TO RESP C

## 2022-06-18 ENCOUNTER — Encounter: Payer: Self-pay | Admitting: Hospice and Palliative Medicine

## 2022-06-18 ENCOUNTER — Inpatient Hospital Stay: Payer: Medicare Other | Attending: Oncology | Admitting: Hospice and Palliative Medicine

## 2022-06-18 ENCOUNTER — Other Ambulatory Visit: Payer: Self-pay

## 2022-06-18 VITALS — BP 127/78 | HR 98 | Temp 97.8°F | Resp 18

## 2022-06-18 DIAGNOSIS — Z79899 Other long term (current) drug therapy: Secondary | ICD-10-CM | POA: Insufficient documentation

## 2022-06-18 DIAGNOSIS — E222 Syndrome of inappropriate secretion of antidiuretic hormone: Secondary | ICD-10-CM | POA: Insufficient documentation

## 2022-06-18 DIAGNOSIS — F17210 Nicotine dependence, cigarettes, uncomplicated: Secondary | ICD-10-CM | POA: Diagnosis not present

## 2022-06-18 DIAGNOSIS — C3491 Malignant neoplasm of unspecified part of right bronchus or lung: Secondary | ICD-10-CM

## 2022-06-18 DIAGNOSIS — D509 Iron deficiency anemia, unspecified: Secondary | ICD-10-CM | POA: Insufficient documentation

## 2022-06-18 DIAGNOSIS — E039 Hypothyroidism, unspecified: Secondary | ICD-10-CM | POA: Diagnosis not present

## 2022-06-18 DIAGNOSIS — Z923 Personal history of irradiation: Secondary | ICD-10-CM | POA: Insufficient documentation

## 2022-06-18 DIAGNOSIS — Z9221 Personal history of antineoplastic chemotherapy: Secondary | ICD-10-CM | POA: Insufficient documentation

## 2022-06-18 DIAGNOSIS — F419 Anxiety disorder, unspecified: Secondary | ICD-10-CM | POA: Diagnosis not present

## 2022-06-18 DIAGNOSIS — J449 Chronic obstructive pulmonary disease, unspecified: Secondary | ICD-10-CM | POA: Diagnosis not present

## 2022-06-18 DIAGNOSIS — C342 Malignant neoplasm of middle lobe, bronchus or lung: Secondary | ICD-10-CM | POA: Diagnosis present

## 2022-06-18 LAB — ACID FAST SMEAR (AFB, MYCOBACTERIA): Acid Fast Smear: NEGATIVE

## 2022-06-18 MED ORDER — MIRTAZAPINE 7.5 MG PO TABS: 7.5000 mg | ORAL_TABLET | Freq: Every day | ORAL | 0 refills | Status: DC

## 2022-06-18 NOTE — Progress Notes (Signed)
[place] at [facility] Telephone:([phone]) [phone] Fax:([phone]) [phone]   Name: Paul Carpenter Date: 06/18/2022 MRN: [id number]  DOB: 24-May-1956  Patient Care Team: [name], MD as PCP - General (Internal Medicine) [name], MD as Referring Physician (Vascular Surgery) [name], MD as Consulting Physician (Oncology) [name], MD as Referring Physician (Radiation Oncology)    REASON FOR CONSULTATION: Paul Carpenter is a 66 y.o. 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  Patient has chronic Mycobacterium avium infection.   SOCIAL HISTORY:     reports that he has been smoking cigarettes. He has a 150.00 pack-year smoking history. He has never used smokeless tobacco. He reports current alcohol use of about 2.0 standard drinks of alcohol per week. He reports that he does not use drugs.  Patient is married lives at home with his wife.  He previously worked in [employer].  ADVANCE DIRECTIVES:  Does not have  CODE STATUS:   PAST MEDICAL HISTORY: Past Medical History:  Diagnosis Date   Anginal pain ([place])    Anxiety    Asthma    Chest pain    CHF (congestive heart failure) (HCC)    Chicken pox    Complication of anesthesia    o2 dropped after neck fusion   COPD (chronic obstructive pulmonary disease) (HCC)    Coronary artery disease    Cough    chronic  clear phlegm   Dysrhythmia    palpitations   GERD (gastroesophageal reflux disease)    h/o reflux/ hoarsness   Hematochezia    Hemorrhoids    History of chickenpox    History of colon polyps    History of Helicobacter  pylori infection    Hoarseness    Hypertension    Lung cancer ([place]) 05/2016   Chemo + rad tx's.    Migraines    OSA (obstructive sleep apnea)    has CPAP but does not use   Personal history of tobacco use, presenting hazards to health 03/05/2016   Pneumonia    5/17   Raynaud disease    Raynaud disease    Raynaud's disease    Rotator cuff tear    on right   Shortness of breath dyspnea    Sleep apnea    Ulcer (traumatic) of oral mucosa     PAST SURGICAL HISTORY:  Past Surgical History:  Procedure Laterality Date   BACK SURGERY     cervical fusion x 2   CARDIAC CATHETERIZATION     CERVICAL DISCECTOMY     x 2   COLONOSCOPY     COLONOSCOPY N/A 07/25/2015   Procedure: COLONOSCOPY;  Surgeon: Lollie Sails, MD;  Location: [facility] ENDOSCOPY;  Service: Endoscopy;  Laterality: N/A;   COLONOSCOPY WITH PROPOFOL N/A 10/04/2017   Procedure: COLONOSCOPY WITH PROPOFOL;  Surgeon: Lollie Sails, MD;  Location: [facility] ENDOSCOPY;  Service: Endoscopy;  Laterality: N/A;   COLONOSCOPY WITH PROPOFOL N/A 07/10/2020   Procedure: COLONOSCOPY WITH PROPOFOL;  Surgeon: Toledo, Benay Pike, MD;  Location: [facility] ENDOSCOPY;  Service: Gastroenterology;  Laterality: N/A;   ELECTROMAGNETIC NAVIGATION BROCHOSCOPY Left 06/28/2016   Procedure: ELECTROMAGNETIC NAVIGATION BRONCHOSCOPY;  Surgeon: Vilinda Boehringer, MD;  Location: [facility] ORS;  Service: Cardiopulmonary;  Laterality: Left;   ENDOBRONCHIAL ULTRASOUND N/A 04/11/2018   Procedure: ENDOBRONCHIAL ULTRASOUND;  Surgeon: [name], MD;  Location: [facility] ORS;  Service: Cardiopulmonary;  Laterality: N/A;   ESOPHAGOGASTRODUODENOSCOPY N/A 07/25/2015   Procedure: ESOPHAGOGASTRODUODENOSCOPY (EGD);  Surgeon: Lollie Sails, MD;  Location: [facility] ENDOSCOPY;  Service: Endoscopy;  Laterality: N/A;   ESOPHAGOGASTRODUODENOSCOPY (EGD) WITH PROPOFOL N/A 07/10/2020   Procedure: ESOPHAGOGASTRODUODENOSCOPY (EGD) WITH PROPOFOL;  Surgeon: Toledo, Benay Pike, MD;  Location: [facility] ENDOSCOPY;   Service: Gastroenterology;  Laterality: N/A;   NASAL SINUS SURGERY     x 2    PORTA CATH INSERTION N/A 04/24/2018   Procedure: PORTA CATH INSERTION;  Surgeon: Algernon Huxley, MD;  Location: [facility];  Service: Cardiovascular;  Laterality: N/A;   rotator cuff surgery Right    07/2016   SEPTOPLASTY     SKIN GRAFT      HEMATOLOGY/ONCOLOGY HISTORY:  Oncology History  Non-small cell lung cancer, right ([place])  04/24/2018 Initial Diagnosis   Non-small cell lung cancer, right ([place])   04/28/2018 Cancer Staging   Staging form: Lung, AJCC 8th Edition - Clinical stage from 04/28/2018: Stage IIIA (cT1b, cN2, cM0) - Signed by [name], MD on 04/28/2018   05/03/2018 - 06/21/2018 Chemotherapy   The patient had palonosetron (ALOXI) injection 0.25 mg, 0.25 mg, Intravenous,  Once, 8 of 8 cycles Administration: 0.25 mg (05/03/2018), 0.25 mg (05/11/2018), 0.25 mg (06/07/2018), 0.25 mg (06/14/2018), 0.25 mg (06/21/2018), 0.25 mg (05/17/2018), 0.25 mg (05/24/2018), 0.25 mg (05/31/2018) CARBOplatin (PARAPLATIN) 300 mg in sodium chloride 0.9 % 100 mL chemo infusion, 300 mg (100 % of original dose 299.4 mg), Intravenous,  Once, 8 of 8 cycles Dose modification:   (original dose 299.4 mg, Cycle 1) Administration: 300 mg (05/11/2018), 300 mg (06/07/2018), 300 mg (06/14/2018), 300 mg (06/21/2018), 300 mg (05/17/2018), 300 mg (05/24/2018), 300 mg (05/31/2018) PACLitaxel (TAXOL) 96 mg in sodium chloride 0.9 % 250 mL chemo infusion (</= 67m/m2), 45 mg/m2 = 96 mg, Intravenous,  Once, 1 of 1 cycle Administration: 96 mg (05/03/2018)  for chemotherapy treatment.    07/12/2018 - 06/27/2019 Chemotherapy   Patient is on Treatment Plan : LUNG DURVALUMAB QH06C    11/26/2021 - 01/29/2022 Chemotherapy   Patient is on Treatment Plan : LUNG Docetaxel q21d       ALLERGIES:  is allergic to lisinopril and varenicline.  MEDICATIONS:  Current Outpatient Medications  Medication Sig Dispense Refill   acetaminophen (TYLENOL) 500 MG  tablet Take 500 mg by mouth daily as needed.     albuterol (PROAIR HFA) 108 (90 Base) MCG/ACT inhaler Inhale 2 puffs into the lungs every 4 (four) hours as needed for wheezing or shortness of breath. 18 g 2   albuterol (PROVENTIL) (2.5 MG/3ML) 0.083% nebulizer solution Take 3 mLs (2.5 mg total) by nebulization 4 (four) times daily as needed for wheezing or shortness of breath. 120 mL 2   atorvastatin (LIPITOR) 40 MG tablet Take 40 mg by mouth at bedtime.      azithromycin (ZITHROMAX) 500 MG tablet Take 1 tablet (500 mg total) by mouth daily. 30 tablet 3   Budeson-Glycopyrrol-Formoterol (BREZTRI AEROSPHERE) 160-9-4.8 MCG/ACT AERO Inhale 2 puffs into the lungs in the morning and at bedtime. 10.7 g 11   Budeson-Glycopyrrol-Formoterol (BREZTRI AEROSPHERE) 160-9-4.8 MCG/ACT AERO Inhale 2 puffs into the lungs in the morning and at bedtime. 5.9 g 0   diazepam (VALIUM) 5 MG tablet Take 5 mg by mouth at  bedtime as needed.     diazepam (VALIUM) 5 MG tablet Take by mouth.     ethambutol (MYAMBUTOL) 400 MG tablet Take 3 tablets (1,200 mg total) by mouth daily. 90 tablet 3   ferrous sulfate 325 (65 FE) MG tablet Take 325 mg by mouth at bedtime.     levothyroxine (SYNTHROID) 150 MCG tablet Take 150 mcg by mouth daily before breakfast.     lidocaine-prilocaine (EMLA) cream Apply 1 application topically as needed. 30 g 1   loratadine (CLARITIN) 10 MG tablet Take 10 mg by mouth daily.     magnesium oxide (MAG-OX) 400 MG tablet Take 600 mg by mouth 2 (two) times daily. Takes 1.5 tablet twice daily     methylPREDNISolone (MEDROL DOSEPAK) 4 MG TBPK tablet Take as directed in the package. 21 tablet 0   nitroGLYCERIN (NITROSTAT) 0.4 MG SL tablet Place under the tongue.     OXYGEN Inhale 2 L into the lungs at bedtime.     pantoprazole (PROTONIX) 40 MG tablet Take 1 tablet (40 mg total) by mouth 2 (two) times daily before meals     sodium chloride 1 g tablet Take 1 tablet (1 g total) by mouth 3 (three) times daily with  meals. 90 tablet 1   tadalafil (CIALIS) 5 MG tablet Take 1 tablet (5 mg total) by mouth daily as needed for erectile dysfunction. 90 tablet 3   traMADol (ULTRAM) 50 MG tablet Take 50 mg by mouth every 8 (eight) hours as needed.     No current facility-administered medications for this visit.    VITAL SIGNS: There were no vitals taken for this visit. There were no vitals filed for this visit.  Estimated body mass index is 23.92 kg/m as calculated from the following:   Height as of 06/16/22: 6' (1.829 m).   Weight as of 06/16/22: 176 lb 6.4 oz (80 kg).  LABS: CBC:    Component Value Date/Time   WBC 9.6 05/03/2022 1424   HGB 12.8 (L) 05/03/2022 1424   HGB 13.4 12/14/2018 1040   HCT 38.2 (L) 05/03/2022 1424   HCT 37.1 (L) 12/14/2018 1040   PLT 223 05/03/2022 1424   PLT 228 07/08/2017 0807   MCV 94.6 05/03/2022 1424   MCV 93 07/08/2017 0807   MCV 97 06/03/2014 1236   NEUTROABS 8.5 (H) 05/03/2022 1424   NEUTROABS 6.0 07/08/2017 0807   NEUTROABS 9.1 (H) 06/03/2014 1236   LYMPHSABS 0.4 (L) 05/03/2022 1424   LYMPHSABS 1.8 07/08/2017 0807   LYMPHSABS 2.2 06/03/2014 1236   MONOABS 0.6 05/03/2022 1424   MONOABS 0.8 06/03/2014 1236   EOSABS 0.1 05/03/2022 1424   EOSABS 0.1 07/08/2017 0807   EOSABS 0.2 06/03/2014 1236   BASOSABS 0.1 05/03/2022 1424   BASOSABS 0.0 07/08/2017 0807   BASOSABS 0.2 (H) 06/03/2014 1236   Comprehensive Metabolic Panel:    Component Value Date/Time   NA 135 06/01/2022 1910   NA 136 12/25/2012 0951   K 4.4 06/01/2022 1910   K 4.0 12/25/2012 0951   CL 98 06/01/2022 1910   CL 102 12/25/2012 0951   CO2 27 06/01/2022 1910   CO2 31 12/25/2012 0951   BUN 6 (L) 06/01/2022 1910   BUN 10 12/25/2012 0951   CREATININE 0.77 06/01/2022 1910   CREATININE 0.88 12/25/2012 0951   GLUCOSE 107 (H) 06/01/2022 1910   GLUCOSE 86 12/25/2012 0951   CALCIUM 9.0 06/01/2022 1910   CALCIUM 8.9 12/25/2012 0951   AST 15 05/03/2022 1424  ALT 10 05/03/2022 1424   ALKPHOS 94  05/03/2022 1424   BILITOT 0.3 05/03/2022 1424   PROT 6.6 05/03/2022 1424   ALBUMIN 3.5 05/03/2022 1424    RADIOGRAPHIC STUDIES: No results found.  PERFORMANCE STATUS (ECOG) : 1 - Symptomatic but completely ambulatory  Review of Systems Unless otherwise noted, a complete review of systems is negative.  Physical Exam General: NAD Pulmonary: Unlabored Extremities: no edema, no joint deformities Skin: no rashes Neurological: Weakness but otherwise nonfocal  IMPRESSION: I met with patient today to discuss goals.  He says he has recently felt some anxiety and concern that he is not currently on cancer treatments.  However, he recognizes that the current focus is on his active lung infection.  He says "I know that either the infection or the cancer will take me out of here."  Patient says that he is "ready to go" when it is his time.  However, his goals seem clearly aligned with continuing the current scope of treatment.  He talked about his days of lifting weights and playing weekly softball, although his wife reminded him that he did those things over 30 years ago.   We did discuss focusing on quality of life and maximizing comfort for however long he has to live.  His wife was concerned that patient has depressive symptoms, although patient readily denies this.  Patient was in agreement with trial of an antidepressant.  We will start mirtazapine as this will likely also benefit his appetite and insomnia and have a lower risk of worsening his hyponatremia.  I reviewed CODE STATUS and sent patient home with a MOST form.  He wanted some time to think about decision-making.  I recommended consideration of home palliative care but patient wanted to think about this.  PLAN: -Continue current scope of treatment -Start mirtazapine 7.5 mg nightly -MOST form reviewed -telephone visit 3-4 weeks  Case and plan discussed with Dr. Grayland Ormond  Patient expressed understanding and was in  agreement with this plan. He also understands that He can call the clinic at any time with any questions, concerns, or complaints.     Time Total: 25 minutes  Visit consisted of counseling and education dealing with the complex and emotionally intense issues of symptom management and palliative care in the setting of serious and potentially life-threatening illness.Greater than 50%  of this time was spent counseling and coordinating care related to the above assessment and plan.  Signed by: Altha Harm, PhD, NP-C

## 2022-06-18 NOTE — Progress Notes (Signed)
Pt being referred back to [facility] by Dr. Duwayne Heck. "Pt stated he wants to discuss pain control with Josh." He is here to discuss goals of care. "I want assurance that the cancer is not growing. I am not eligable for treatment anymore. They can not tell if my pain is coming from the infection vs cancer. I want answers to what is causing my inspiratory pain."

## 2022-06-20 LAB — CULTURE, RESPIRATORY W GRAM STAIN: Culture: NORMAL

## 2022-06-29 ENCOUNTER — Ambulatory Visit (INDEPENDENT_AMBULATORY_CARE_PROVIDER_SITE_OTHER): Payer: Medicare Other | Admitting: Pulmonary Disease

## 2022-06-29 ENCOUNTER — Other Ambulatory Visit
Admission: RE | Admit: 2022-06-29 | Discharge: 2022-06-29 | Disposition: A | Discharge status: Home / Self Care | Payer: Medicare Other | Source: Ambulatory Visit | Attending: Pulmonary Disease | Admitting: Pulmonary Disease

## 2022-06-29 ENCOUNTER — Encounter: Payer: Self-pay | Admitting: Pulmonary Disease

## 2022-06-29 VITALS — BP 120/70 | HR 85 | Temp 97.4°F | Ht 72.0 in | Wt 176.8 lb

## 2022-06-29 DIAGNOSIS — C3490 Malignant neoplasm of unspecified part of unspecified bronchus or lung: Secondary | ICD-10-CM

## 2022-06-29 DIAGNOSIS — C3491 Malignant neoplasm of unspecified part of right bronchus or lung: Secondary | ICD-10-CM

## 2022-06-29 DIAGNOSIS — J449 Chronic obstructive pulmonary disease, unspecified: Secondary | ICD-10-CM

## 2022-06-29 DIAGNOSIS — A310 Pulmonary mycobacterial infection: Secondary | ICD-10-CM

## 2022-06-29 DIAGNOSIS — R0789 Other chest pain: Secondary | ICD-10-CM | POA: Diagnosis not present

## 2022-06-29 DIAGNOSIS — F17210 Nicotine dependence, cigarettes, uncomplicated: Secondary | ICD-10-CM

## 2022-06-29 LAB — CREATININE, SERUM
Creatinine, Ser: 0.63 mg/dL (ref 0.61–1.24)
GFR, Estimated: >60 mL/min (ref >60)

## 2022-06-29 LAB — BUN: BUN: 6 mg/dL — ABNORMAL LOW (ref 8–23)

## 2022-06-29 MED ORDER — MELOXICAM 15 MG PO TABS: 15.0000 mg | ORAL_TABLET | Freq: Every day | ORAL | 2 refills | Status: DC

## 2022-06-29 MED ORDER — BREZTRI AEROSPHERE 160-9-4.8 MCG/ACT IN AERO: 2.0000 | INHALATION_SPRAY | Freq: Two times a day (BID) | RESPIRATORY_TRACT | 0 refills | Status: DC

## 2022-06-29 NOTE — Patient Instructions (Signed)
I have sent for you to get another CT scan.  I have sent to your pharmacy a medication to see if that helps with your pain this is only once a day make sure you take it with the largest meal of the day.  We will see you in follow-up in 4-6 weeks time either with me or the nurse practitioner.  I will speak to Dr. [name] to see if we need to add another medication for your MAC.

## 2022-06-29 NOTE — Progress Notes (Signed)
Subjective:    Patient ID: Paul Carpenter, male    DOB: May 14, 1956, 66 y.o.   MRN: [id number] Patient Care Team: [name], MD as PCP - General (Internal Medicine) [name], MD as Referring Physician (Vascular Surgery) [name], MD as Consulting Physician (Oncology) [name], MD as Referring Physician (Radiation Oncology)  Chief Complaint  Patient presents with   Follow-up    HPI Patient is a 66 year old current smoker (1.5 PPD, 150 PY) who presents for follow-up after an acute visit on 16 June 2022.  Recall that he has been followed for a cavitary right upper lobe process in the setting of history of non-small cell carcinoma of the lung, with recent recurrence.  The patient's primary pulmonologist is Dr. [name].  Patient was last seen by Dr. [name] on 19 November 2021 at that time the patient had been noted to have recurrence of stage IIIa non-small cell lung cancer of the right middle lobe.  It was suspicion of progression of disease on his most recent PET/CT of 16 November 2021.  Patient at that time could not undergo procedure such as bronchoscopy due to recent C2 cervical fracture.  He underwent chemotherapy with Taxotere every 3 weeks for 4 cycles.  On the day of April he was admitted for observation due to acute on chronic hypoxemic respiratory failure, at that time patient was having issues with fever up to 102.4 and purulent sputum production.  A CT of the chest at that time was performed to assess for PE.  This showed progressive multi cavitary lesion occupying a good portion of the right upper lobe and enlarging right infrahilar mediastinal lymph nodes and several small solid pulmonary nodules.  There was also patchy nodular airspace disease with surrounding tree-in-bud appearance (this has been present since as far back as 2016) there are also severe underlying emphysematous changes.  Because of these findings MAC was suspected and infectious disease and  pulmonary disease consultants were asked to render opinion.  The patient was seen by Dr. [name] and by Dr. [name].  The patient requested early discharge home and was sent home on Augmentin and azithromycin for suspected community-acquired pneumonia and possible MAC. Patient was discharged the following day.  He presents on 09 February 2022 for consideration of bronchoscopy and I evaluated him at that time.  At that time he was still coughing copious amounts of thick green sputum and he continued to have issues with malaise and right-sided pleuritic chest pain.  Since his discharge microbiology has been resulted and the patient is growing Pseudomonas aeruginosa.  Patient at that time was treated with Cipro and his symptoms improved.  On subsequent visit on 20 April 2022 his sputum had cleared however he still had significant wheezing he was given a Medrol Dosepak and Trelegy was switched to [place] 2 puffs twice a day.  He was also instructed and counseled with regards to discontinuation of smoking and limiting his alcohol intake to just 1 drink a day.  After his April visit the patient was seen by oncology to discuss a PET/CT and after discussion with [name], NP it was decided to recollect sputum to ensure that he had clear of the Pseudomonas.  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  He has been referred to infectious diseases.  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   He presents today as an acute visit.  He notices sharp "like electricity" pains all throughout his  body particularly around his chest.  Continues to complain about this pain.  Nothing seems to alleviate it.  There has been no objective finding to explain it.  He states that he is concerned his cancer is back.  It has been explained to him that with the concomitant MAC infection biopsies are going to be secured by inflammation.  In addition he would not be able to get cancer therapy with active MAC infection as it would  exacerbate the infection and be life-threatening.  He does not feel that the inhalers or nebulizers are working.  He continues to expectorate green copious amounts of sputum again.  No hemoptysis.  The most recent sputum specimen shows persistent MAC but no other organism.  He does not describe any fevers, chills or sweats.  He is evasive as to how much alcohol he is drinking currently.  He had a PET/CT on 29 June that showed that the area of cavitation on the right upper lobe was showing decreased metabolic activity when compared to the prior study.  There was only mild uptake in the periphery.  There were also areas of metabolic activity that could be consistent with chronic multifocal fungal or other infection.  These were showing waxing and waning appearance.  Close attention to follow-up was recommended.   As noted the patient continues to smoke, he does wear oxygen nocturnally but has been noncompliant with CPAP for obstructive sleep apnea.  He does have nocturnal oxygen desaturations per his wife.  He is on Breztri 2 puffs twice a day and albuterol via nebulizer as needed.  He does note again that his secretions are very thick and copious.   He does not endorse any other symptomatology.   Review of Systems A 10 point review of systems was performed and it is as noted above otherwise negative.  Patient Active Problem List   Diagnosis Date Noted   Shortness of breath 02/03/2022   Musculoskeletal chest pain 02/03/2022   SIADH (syndrome of inappropriate ADH production) ([place]) 09/22/2021   Overweight (BMI 25.0-29.9) 09/22/2021   Hypomagnesemia 09/21/2021   Elevated hemoglobin A1c 06/19/2020   Hypothyroidism, acquired 05/30/2019   Squamous cell lung cancer, right ([place]) 05/30/2019   HFrEF (heart failure with reduced ejection fraction) ([place]) 03/06/2019   Atherosclerosis 12/14/2018   Non-small cell lung cancer, right ([place]) 04/24/2018   Oral ulcer 02/16/2018   Pituitary disorder ([place]) 02/16/2018    Migraine headache 03/07/2017   HCAP (healthcare-associated pneumonia) 09/11/2016   COPD exacerbation ([place]) 09/11/2016   Hyponatremia 09/11/2016   Leukocytosis 09/11/2016   Thrombocytopenia ([place]) 09/11/2016   Sepsis ([place]) 09/09/2016   Cigarette smoker 06/11/2016   Cervical radiculopathy 04/15/2016   Cervical disc disorder at C5-C6 level with radiculopathy 03/09/2016   Impingement syndrome of right shoulder 03/09/2016   Personal history of tobacco use, presenting hazards to health 03/05/2016   Health care maintenance 09/29/2015   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   Benign essential hypertension 05/28/2015   Polycythemia 03/24/2015   Carotid artery disease ([place]) 12/12/2014   Disequilibrium 12/12/2014   Mixed hyperlipidemia 12/10/2014   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 headache (migraine equivalents) 08/12/2014   Incomplete emptying of bladder 06/04/2014   Anxiety 05/18/2014   Chronic coronary artery disease 05/18/2014   Chronic headaches 05/18/2014   Acute shoulder pain 03/15/2014   Impingement syndrome of left shoulder 03/15/2014   Lung mass 12/06/2013   Kidney stone 11/24/2013   COPD (chronic obstructive pulmonary disease) ([place]) 04/24/2013   Tobacco use disorder 04/24/2013   Obstructive  sleep apnea 04/24/2013   Benign localized prostatic hyperplasia with lower urinary tract symptoms (LUTS) 07/04/2012   Encounter for long-term current use of medication 07/04/2012   ED (erectile dysfunction) of organic origin 07/04/2012   Testicular hypofunction 07/04/2012   Social History   Tobacco Use   Smoking status: Every Day    Packs/day: 3.00    Years: 50.00    Total pack years: 150.00    Types: Cigarettes   Smokeless tobacco: Never   Tobacco comments:    Smoking less than 2 ppd.  Trying to cut back.  06/29/2022 hfb  Substance Use Topics   Alcohol use: Yes    Alcohol/week: 2.0 standard drinks of alcohol    Types: 2 Standard drinks or equivalent per week    Comment: moderate   Allergies   Allergen Reactions   Lisinopril Rash   Varenicline Rash   Current Meds  Medication Sig   acetaminophen (TYLENOL) 500 MG tablet Take 500 mg by mouth daily as needed.   albuterol (PROAIR HFA) 108 (90 Base) MCG/ACT inhaler Inhale 2 puffs into the lungs every 4 (four) hours as needed for wheezing or shortness of breath.   albuterol (PROVENTIL) (2.5 MG/3ML) 0.083% nebulizer solution Take 3 mLs (2.5 mg total) by nebulization 4 (four) times daily as needed for wheezing or shortness of breath.   atorvastatin (LIPITOR) 40 MG tablet Take 40 mg by mouth at bedtime.    azithromycin (ZITHROMAX) 500 MG tablet Take 500 mg by mouth daily.   Budeson-Glycopyrrol-Formoterol (BREZTRI AEROSPHERE) 160-9-4.8 MCG/ACT AERO Inhale 2 puffs into the lungs in the morning and at bedtime.   diazepam (VALIUM) 5 MG tablet Take 5 mg by mouth at bedtime as needed.   ethambutol (MYAMBUTOL) 400 MG tablet Take 3 tablets (1,200 mg total) by mouth daily.   ferrous sulfate 325 (65 FE) MG tablet Take 325 mg by mouth at bedtime.   levothyroxine (SYNTHROID) 150 MCG tablet Take 150 mcg by mouth daily before breakfast.   lidocaine-prilocaine (EMLA) cream Apply 1 application topically as needed.   loratadine (CLARITIN) 10 MG tablet Take 10 mg by mouth daily.   magnesium oxide (MAG-OX) 400 MG tablet Take 600 mg by mouth 2 (two) times daily. Takes 1.5 tablet twice daily   mirtazapine (REMERON) 7.5 MG tablet Take 1 tablet (7.5 mg total) by mouth at bedtime.   nitroGLYCERIN (NITROSTAT) 0.4 MG SL tablet Place under the tongue.   OXYGEN Inhale 2 L into the lungs at bedtime.   pantoprazole (PROTONIX) 40 MG tablet Take 1 tablet (40 mg total) by mouth 2 (two) times daily before meals   sodium chloride 1 g tablet Take 1 tablet (1 g total) by mouth 3 (three) times daily with meals.   tadalafil (CIALIS) 5 MG tablet Take 1 tablet (5 mg total) by mouth daily as needed for erectile dysfunction.   traMADol (ULTRAM) 50 MG tablet Take 50 mg by mouth  every 8 (eight) hours as needed.   Immunization History  Administered Date(s) Administered   Fluad Quad(high Dose 65+) 09/22/2020   Influenza Inj Mdck Quad Pf 09/06/2016, 10/02/2019, 09/09/2021   Influenza Split 08/05/2013, 07/23/2014, 08/18/2015   Influenza Whole 08/01/2012   Influenza,inj,Quad PF,6+ Mos 08/14/2018   Influenza-Unspecified 09/06/2016, 08/17/2017, 09/09/2021   Zoster Recombinat (Shingrix) 06/19/2020       Objective:   Physical Exam BP 120/70 (BP Location: Left Arm, Patient Position: Sitting, Cuff Size: Normal)   Pulse 85   Temp (!) 97.4 F (36.3 C) (Oral)  Ht 6' (1.829 m)   Wt 176 lb 12.8 oz (80.2 kg)   SpO2 97%   BMI 23.98 kg/m  GENERAL: Debilitated appearing gentleman, well nourished.  No tachypnea noted.  Fully ambulatory.  No conversational dyspnea.  Follow appearance. HEAD: Normocephalic, atraumatic.  EYES: Pupils equal, round, reactive to light.  No scleral icterus.  MOUTH: Oral mucosa moist.  No thrush. NECK: Supple. No thyromegaly. Trachea midline. No JVD.  No adenopathy. PULMONARY: Good air entry bilaterally.  Loud rhonchi throughout particularly at the right base.  Rare wheezes. CARDIOVASCULAR: S1 and S2. Regular rate and rhythm.  No rubs, murmurs or gallops heard. ABDOMEN: Benign. MUSCULOSKELETAL: No joint deformity, no clubbing, no edema.  NEUROLOGIC: Mild psychomotor retardation.  No overt focal deficit.  No gait disturbance.  Speech is fluent. SKIN: Intact,warm,dry. PSYCH: Depressed mood and affect.         Assessment & Plan:     ICD-10-CM   1. 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 Stage 2 moderate COPD by GOLD classification ([place])  J44.9    Continue Breztri 2 puffs twice a day Continue as needed albuterol    3. Malignant neoplasm of unspecified part of  unspecified bronchus or lung ([facility])  C34.90 CT CHEST W CONTRAST   This issue adds complexity to his management Has received radiotherapy/chemotherapy No evidence of definitive recurrence by recent imaging Recheck CT    4. Atypical chest pain  R07.89    Trial of meloxicam    5. Tobacco dependence due to cigarettes  F17.210    Counseled with regards to discontinuation of smoking Total counseling time 3 to 5 minutes     Orders Placed This Encounter  Procedures   CT CHEST W CONTRAST    Standing Status:   Future    Number of Occurrences:   1    Standing Expiration Date:   12/30/2022    Order Specific Question:   If indicated for the ordered procedure, I authorize the administration of contrast media per Radiology protocol    Answer:   Yes    Order Specific Question:   Preferred imaging location?    Answer:   [facility]   Creatinine    Standing Status:   Future    Number of Occurrences:   1    Standing Expiration Date:   06/30/2023   BUN    Standing Status:   Future    Number of Occurrences:   1    Standing Expiration Date:   06/30/2023   Meds ordered this encounter  Medications   meloxicam (MOBIC) 15 MG tablet    Sig: Take 1 tablet (15 mg total) by mouth daily. Take with largest meal of the day.    Dispense:  30 tablet    Refill:  2   Budeson-Glycopyrrol-Formoterol (BREZTRI AEROSPHERE) 160-9-4.8 MCG/ACT AERO    Sig: Inhale 2 puffs into the lungs in the morning and at bedtime.    Dispense:  5.9 g    Refill:  0    Order Specific Question:   Lot Number?    Answer:   [id number]    Order Specific Question:   Manufacturer?    Answer:   [employer] [[id number]]    Order Specific Question:   Quantity  Answer:   1   Will see the patient in follow-up in 4 to 6 weeks time he is to contact us prior to that time should any new difficulties arise.  Renold Don, MD Advanced Bronchoscopy PCCM [facility]    *This note was dictated using voice recognition  software/Dragon.  Despite best efforts to proofread, errors can occur which can change the meaning. Any transcriptional errors that result from this process are unintentional and may not be fully corrected at the time of dictation.

## 2022-06-30 ENCOUNTER — Other Ambulatory Visit
Admission: RE | Admit: 2022-06-30 | Discharge: 2022-06-30 | Disposition: A | Discharge status: Home / Self Care | Payer: Medicare Other | Source: Ambulatory Visit | Attending: Pulmonary Disease | Admitting: Pulmonary Disease

## 2022-06-30 ENCOUNTER — Ambulatory Visit
Admission: RE | Admit: 2022-06-30 | Discharge: 2022-06-30 | Disposition: A | Discharge status: Home / Self Care | Payer: Medicare Other | Source: Ambulatory Visit | Attending: Pulmonary Disease | Admitting: Pulmonary Disease

## 2022-06-30 DIAGNOSIS — A310 Pulmonary mycobacterial infection: Secondary | ICD-10-CM | POA: Insufficient documentation

## 2022-06-30 DIAGNOSIS — C3490 Malignant neoplasm of unspecified part of unspecified bronchus or lung: Secondary | ICD-10-CM | POA: Insufficient documentation

## 2022-06-30 LAB — EXPECTORATED SPUTUM ASSESSMENT W GRAM STAIN, RFLX TO RESP C

## 2022-06-30 MED ORDER — IOHEXOL 300 MG/ML  SOLN
75.0000 mL | Freq: Once | INTRAMUSCULAR | Status: AC | PRN
  Administered 2022-06-30: 75 mL via INTRAVENOUS

## 2022-07-01 LAB — ACID FAST SMEAR (AFB, MYCOBACTERIA): Acid Fast Smear: NEGATIVE

## 2022-07-02 ENCOUNTER — Telehealth: Payer: Self-pay | Admitting: Pulmonary Disease

## 2022-07-02 DIAGNOSIS — R911 Solitary pulmonary nodule: Secondary | ICD-10-CM

## 2022-07-02 LAB — CULTURE, RESPIRATORY W GRAM STAIN: Culture: NORMAL

## 2022-07-02 NOTE — Telephone Encounter (Signed)
Spoke to patient.  He is requesting CT results from 06/30/2022. He is aware that Dr. Patsey Berthold is out of the office until 06/06/2022  Dr. Patsey Berthold, please advise. Thanks

## 2022-07-05 NOTE — Telephone Encounter (Signed)
I have reviewed the scan and have discussed with Dr. Grayland Ormond as well.  Still the plan is to continue treating MAC which is active.  Most of the changes are likely related to this.  Dr. [name] will be made aware to see if adding a third medication for the MAC is advisable at this time.  We will let him know.

## 2022-07-06 NOTE — Telephone Encounter (Signed)
Patient is aware of results and voiced his understanding.  Added Dr. [name] to this message for recommendations per patient request.

## 2022-07-07 ENCOUNTER — Telehealth: Payer: Self-pay

## 2022-07-07 NOTE — Telephone Encounter (Signed)
Patient is aware of below message/recommendations. CT ordered.

## 2022-07-07 NOTE — Telephone Encounter (Signed)
Received call from patient's wife, she says Paul Carpenter saw the pulmonologist a couple of weeks ago and based on CT findings they may want to add another antibiotic to his MAC regimen.   A CT biopsy has been ordered and they are waiting on this to be scheduled. Patient is scheduled to follow up with Dr. [name] 10/3, they may want to move appointment up if CT biopsy results before then.   Beryle Flock, RN

## 2022-07-07 NOTE — Addendum Note (Signed)
Addended by: Claudette Head A on: 07/07/2022 11:37 AM   Modules accepted: Orders

## 2022-07-08 LAB — MAC SUSCEPTIBILITY BROTH
Amikacin: 8
Ciprofloxacin: >8
Clarithromycin: 2
Doxycycline: >8
Linezolid: 32
Minocycline: >8
Moxifloxacin: 4
Rifabutin: 0.12
Rifampin: 4
Streptomycin: 32

## 2022-07-08 LAB — AFB ORGANISM ID BY DNA PROBE: M avium complex: POSITIVE — AB

## 2022-07-08 LAB — ACID FAST CULTURE WITH REFLEXED SENSITIVITIES (MYCOBACTERIA): Acid Fast Culture: POSITIVE — AB

## 2022-07-13 ENCOUNTER — Other Ambulatory Visit: Payer: Self-pay | Admitting: Oncology

## 2022-07-13 ENCOUNTER — Telehealth: Payer: Self-pay | Admitting: *Deleted

## 2022-07-13 ENCOUNTER — Telehealth: Payer: Self-pay

## 2022-07-13 ENCOUNTER — Other Ambulatory Visit: Payer: Self-pay | Admitting: *Deleted

## 2022-07-13 NOTE — Telephone Encounter (Signed)
Call returned to patient and informed that he does not need a biopsy, that one had been ordered in January, but was cancelled. He thanked me for letting him know

## 2022-07-13 NOTE — Telephone Encounter (Signed)
Patient called in with questions pertaining to a CT bx. He was under the impression that Dr. Patsey Berthold would be performing this a CT guided bx. I have made patient aware that Dr. Patsey Berthold does not perform CT guided bx. I made him aware that Dr. Patsey Berthold had ordered a CT chest to be performed in October prior to his appt. He is aware that Rodena Piety will contact him closer to that time to schedule CT chest.

## 2022-07-13 NOTE — Telephone Encounter (Signed)
Patient called stating that Dr Domingo Dimes office called him and told him that Dr Grayland Ormond will be ordering a needle biopsy and is is calling us to find out if we have scheduled that and when and where to go for it. Please return his call

## 2022-07-13 NOTE — Telephone Encounter (Signed)
[facility]- sent refill request for pt's mirtazpine

## 2022-07-14 ENCOUNTER — Encounter: Payer: Self-pay | Admitting: Oncology

## 2022-07-14 MED ORDER — MIRTAZAPINE 7.5 MG PO TABS: 7.5000 mg | ORAL_TABLET | Freq: Every day | ORAL | 2 refills | Status: DC

## 2022-07-19 ENCOUNTER — Inpatient Hospital Stay: Payer: Medicare Other | Attending: Oncology | Admitting: Hospice and Palliative Medicine

## 2022-07-19 DIAGNOSIS — F418 Other specified anxiety disorders: Secondary | ICD-10-CM | POA: Diagnosis not present

## 2022-07-19 DIAGNOSIS — F419 Anxiety disorder, unspecified: Secondary | ICD-10-CM

## 2022-07-19 MED ORDER — GABAPENTIN 100 MG PO CAPS: 100.0000 mg | ORAL_CAPSULE | Freq: Every day | ORAL | 1 refills | Status: DC

## 2022-07-19 NOTE — Progress Notes (Signed)
Virtual Visit via Telephone Note  I connected with Paul Carpenter on 07/19/22 at  1:00 PM EDT by telephone and verified that I am speaking with the correct person using two identifiers.  Location: Patient: Home Provider: Clinic   I discussed the limitations, risks, security and privacy concerns of performing an evaluation and management service by telephone and the availability of in person appointments. I also discussed with the patient that there may be a patient responsible charge related to this service. The patient expressed understanding and agreed to proceed.   History of Present Illness: Paul Carpenter is a 66 y.o. 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  Patient has chronic Mycobacterium avium infection.    Observations/Objective: I called and spoke with patient, wife, and son.  Patient describes significant emotional burden with expectation of potential future health decline.  He denies depressive symptoms although his family disagrees with that.  I did previously try him on mirtazapine but patient states that he did not tolerate it and did not like the way it made him feel.  His family is interested in other antidepressants, although I am concerned that most antidepressants would have risk of worsening his SIADH/chronic hyponatremia.  Instead, patient does agree to pursue therapeutic counseling and would be interested in establishing a relationship with our LCSW.  We will send referral.  Symptomatically, he also endorses burning pain around his port but denies any erythema, or discharge.  He request trial of "nerve pain medication" and says that he has  tolerated gabapentin in the past.  He says he has not found much benefit from as needed tramadol.  His wife reports that he has history of addictive habits with opioids.  We will start low-dose gabapentin.  I again encouraged patient to consider involvement of home palliative care but he wanted to think about it.  He says that he is considering decisions on the MOST form.  Assessment and Plan: Neuropathic pain -start gabapentin 100 mg nightly  Depression -referral to LCSW  NSCLC -patient is on active treatment for MAC infection and sees ID tomorrow.  He will have follow-up in 2 weeks with Dr. Grayland Ormond.  Follow Up Instructions: Follow-up telephone visit 1 month   I discussed the assessment and treatment plan with the patient. The patient was provided an opportunity to ask questions and all were answered. The patient agreed with the plan and demonstrated an understanding of the instructions.   The patient was advised to call back or seek an in-person evaluation if the symptoms worsen or if the condition fails to improve as anticipated.  I provided 25 minutes of non-face-to-face time during this encounter.   Irean Hong, NP

## 2022-07-20 ENCOUNTER — Encounter: Payer: Self-pay | Admitting: Infectious Diseases

## 2022-07-20 ENCOUNTER — Encounter: Payer: Self-pay | Admitting: Licensed Clinical Social Worker

## 2022-07-20 ENCOUNTER — Ambulatory Visit: Payer: Medicare Other | Attending: Infectious Diseases | Admitting: Infectious Diseases

## 2022-07-20 VITALS — BP 134/79 | HR 108 | Temp 98.2°F | Ht 72.0 in | Wt 177.0 lb

## 2022-07-20 DIAGNOSIS — Z923 Personal history of irradiation: Secondary | ICD-10-CM | POA: Insufficient documentation

## 2022-07-20 DIAGNOSIS — J449 Chronic obstructive pulmonary disease, unspecified: Secondary | ICD-10-CM | POA: Diagnosis not present

## 2022-07-20 DIAGNOSIS — A310 Pulmonary mycobacterial infection: Secondary | ICD-10-CM

## 2022-07-20 DIAGNOSIS — D509 Iron deficiency anemia, unspecified: Secondary | ICD-10-CM | POA: Insufficient documentation

## 2022-07-20 DIAGNOSIS — F17200 Nicotine dependence, unspecified, uncomplicated: Secondary | ICD-10-CM | POA: Diagnosis present

## 2022-07-20 DIAGNOSIS — C3491 Malignant neoplasm of unspecified part of right bronchus or lung: Secondary | ICD-10-CM

## 2022-07-20 DIAGNOSIS — E785 Hyperlipidemia, unspecified: Secondary | ICD-10-CM | POA: Insufficient documentation

## 2022-07-20 DIAGNOSIS — F1090 Alcohol use, unspecified, uncomplicated: Secondary | ICD-10-CM | POA: Diagnosis not present

## 2022-07-20 DIAGNOSIS — Z79890 Hormone replacement therapy (postmenopausal): Secondary | ICD-10-CM | POA: Diagnosis not present

## 2022-07-20 DIAGNOSIS — Z08 Encounter for follow-up examination after completed treatment for malignant neoplasm: Secondary | ICD-10-CM | POA: Insufficient documentation

## 2022-07-20 DIAGNOSIS — E039 Hypothyroidism, unspecified: Secondary | ICD-10-CM | POA: Diagnosis not present

## 2022-07-20 LAB — AFB ORGANISM ID BY DNA PROBE
M avium complex: POSITIVE — AB
M tuberculosis complex: NEGATIVE

## 2022-07-20 LAB — ACID FAST CULTURE WITH REFLEXED SENSITIVITIES (MYCOBACTERIA): Acid Fast Culture: POSITIVE — AB

## 2022-07-20 MED ORDER — RIFAMPIN 300 MG PO CAPS: 600.0000 mg | ORAL_CAPSULE | Freq: Every day | ORAL | 1 refills | Status: DC

## 2022-07-20 NOTE — Patient Instructions (Signed)
You are here for follow up of MAC and you currently are taking ethambutol and azithromycin . Will add rifampin 67m once a day. Follow up 1 month

## 2022-07-20 NOTE — Progress Notes (Signed)
NAME: Paul Carpenter  DOB: 17-Sep-1956  MRN: [id number]  Date/Time: 07/20/2022 10:22 AM  Subjective:  Follow up visit for MAC- last seen 05/06/22- here with his daughter Cavitary lung lesions On Ethambutol and azithromycin 100% adherent  Seen Dr.[name] pulmonologist since then and had a repeat CT lung- some improvement of the cavitary rt lung lesion. She wanted him to start on the 3rd antibiotic rifampin Following taken from last note  Paul Carpenter is a 66 y.o. 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 [name] note he was initially diagnosed with non-small cell carcinoma of the lung in 2019. On 04/11/2018 a bronch with EBUS done and biopsy taken of subcarinal mass- it showed  - POSITIVE FOR MALIGNANT CELLS IN A NECROTIC BACKGROUND.  MRI of the brain completed on Mar 28, 2018 did not reveal any metastatic disease.  He completed radiation June 26, 2018.  He completed concurrent single agent carboplatinum on June 21, 2018.  He had a reaction to Taxol during cycle 1 and this was discontinued.  He completed a year-long maintenance subdural level lab on June 27, 2019.  In July 2022 there was a concern for slight progression of the disease.  PET scan done on 06/03/2021 revealed a spiculated cavitary mass in the apical segment of the right upper lobe with similar SUV which was 3.5 versus 3.31-year ago.  So this was reassuring as per the radiologist.  However disease progression could not be definitely excluded.   no metastasis seen.  A CT scan done on 07/20/2021 showed continued slow evolution of a thick-walled cavitary mass in the right upper lobe which remains concerning for slow-growing  neoplasm.  He had a fall and fractured his C 2 in December , 2022.  He was in [facility] at that time.  CTs chest that was done that showed a large right upper lobe soft tissue mass with similar abnormal tissue involving the right pulmonary hilum extending medially into the ipsilateral mediastinum in keeping with a reported history of primary lung cancer. In January he was hospitalized at [facility] for hyponatremia.  At that time oncologist saw him and got another PET scan.  A bronchoscopy/biopsy could not be done due to C2 fracture.  A PET scan was done on 11/16/2021 and it showed similar size of the cavitary process in the right upper lobe.  Increased size and hypermetabolism compared to the PET on 06/02/2021.  Hypermetabolic pulmonary nodules many of which were new compared to the CAT scan of 10/20/2021.    He was diagnosed to have recurrence and was started on palliative chemo with  taxotere( 4 cycles) on 11/26/21- completed on 01/28/22 He was hospitalized  02/03/22-02/05/22 for sob and cough and fever and sputum sent which was pseudomonas, but realized after his discharge He saw Dr.[name] on 02/09/22 and received a 2 week course of cipro for necrotizing pneumonia with pseudomonas. A repeat PET scan done on 02/23/22 showed Thick-walled cavitary mass in the right upper lobe has decreased in overall hypermetabolism, indicative of treatment response. 2. Multifocal areas of nodular consolidation and peribronchovascular nodularity with a waxing and waning appearance, suggesting combination slight improvement in metastatic disease with a superimposed atypical/fungal infectious process. 3. Mildly hypermetabolic low right paratracheal and right  hilar lymph nodes, metastatic or reactive. 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 and he was referred to me for treatment Not been on any further treatment for lung cancer Followed by Dr.[name] and Dr.[name]  Past Medical History:  Diagnosis Date   Anginal pain ([place])     Anxiety    Asthma    Chest pain    CHF (congestive heart failure) (HCC)    Chicken pox    Complication of anesthesia    o2 dropped after neck fusion   COPD (chronic obstructive pulmonary disease) ([place])    Coronary artery disease    Cough    chronic  clear phlegm   Dysrhythmia    palpitations   GERD (gastroesophageal reflux disease)    h/o reflux/ hoarsness   Hematochezia    Hemorrhoids    History of chickenpox    History of colon polyps    History of Helicobacter pylori infection    Hoarseness    Hypertension    Lung cancer ([place]) 05/2016   Chemo + rad tx's.    Migraines    OSA (obstructive sleep apnea)    has CPAP but does not use   Personal history of tobacco use, presenting hazards to health 03/05/2016   Pneumonia    5/17   Raynaud disease    Raynaud disease    Raynaud's disease    Rotator cuff tear    on right   Shortness of breath dyspnea    Sleep apnea    Ulcer (traumatic) of oral mucosa     Past Surgical History:  Procedure Laterality Date   BACK SURGERY     cervical fusion x 2   CARDIAC CATHETERIZATION     CERVICAL DISCECTOMY     x 2   COLONOSCOPY     COLONOSCOPY N/A 07/25/2015   Procedure: COLONOSCOPY;  Surgeon: Lollie Sails, MD;  Location: [facility] ENDOSCOPY;  Service: Endoscopy;  Laterality: N/A;   COLONOSCOPY WITH PROPOFOL N/A 10/04/2017   Procedure: COLONOSCOPY WITH PROPOFOL;  Surgeon: Lollie Sails, MD;  Location: [facility] ENDOSCOPY;  Service: Endoscopy;  Laterality: N/A;   COLONOSCOPY WITH PROPOFOL N/A 07/10/2020   Procedure: COLONOSCOPY WITH PROPOFOL;  Surgeon: Toledo, Benay Pike, MD;  Location: [facility] ENDOSCOPY;  Service: Gastroenterology;  Laterality: N/A;   ELECTROMAGNETIC NAVIGATION BROCHOSCOPY Left 06/28/2016   Procedure: ELECTROMAGNETIC NAVIGATION BRONCHOSCOPY;  Surgeon: Vilinda Boehringer, MD;  Location: [facility] ORS;  Service: Cardiopulmonary;  Laterality: Left;   ENDOBRONCHIAL ULTRASOUND N/A 04/11/2018   Procedure: ENDOBRONCHIAL ULTRASOUND;  Surgeon:  [name], MD;  Location: [facility] ORS;  Service: Cardiopulmonary;  Laterality: N/A;   ESOPHAGOGASTRODUODENOSCOPY N/A 07/25/2015   Procedure: ESOPHAGOGASTRODUODENOSCOPY (EGD);  Surgeon: Lollie Sails, MD;  Location: [facility] ENDOSCOPY;  Service: Endoscopy;  Laterality: N/A;   ESOPHAGOGASTRODUODENOSCOPY (EGD) WITH PROPOFOL N/A 07/10/2020   Procedure: ESOPHAGOGASTRODUODENOSCOPY (EGD) WITH PROPOFOL;  Surgeon: Toledo, Benay Pike, MD;  Location: [facility] ENDOSCOPY;  Service: Gastroenterology;  Laterality: N/A;   NASAL SINUS SURGERY     x 2    PORTA CATH INSERTION N/A 04/24/2018   Procedure: PORTA CATH INSERTION;  Surgeon: Algernon Huxley, MD;  Location: [place] CV LAB;  Service: Cardiovascular;  Laterality: N/A;   rotator cuff surgery Right    07/2016   SEPTOPLASTY     SKIN GRAFT      Social History   Socioeconomic History   Marital status: Married    Spouse name: Not on file   Number of children: 3   Years  of education: Not on file   Highest education level: Not on file  Occupational History   Occupation: Architect Work  Tobacco Use   Smoking status: Every Day    Packs/day: 3.00    Years: 50.00    Total pack years: 150.00    Types: Cigarettes   Smokeless tobacco: Never   Tobacco comments:    Smoking less than 2 ppd.  Trying to cut back.  06/29/2022 hfb  Vaping Use   Vaping Use: Never used  Substance and Sexual Activity   Alcohol use: Yes    Alcohol/week: 2.0 standard drinks of alcohol    Types: 2 Standard drinks or equivalent per week    Comment: moderate   Drug use: No   Sexual activity: Not on file  Other Topics Concern   Not on file  Social History Narrative   Not on file   Social Determinants of Health   Financial Resource Strain: Not on file  Food Insecurity: Not on file  Transportation Needs: Not on file  Physical Activity: Not on file  Stress: Not on file  Social Connections: Not on file  Intimate Partner Violence: Not on file    Family History  Problem  Relation Age of Onset   Heart disease Father    Prostate cancer Father    Heart disease Paternal Grandmother    Heart attack Maternal Grandfather 18   Kidney cancer Neg Hx    Bladder Cancer Neg Hx    Other Neg Hx        pituitary abnormality   Allergies  Allergen Reactions   Lisinopril Rash   Varenicline Rash   I? Current Outpatient Medications  Medication Sig Dispense Refill   acetaminophen (TYLENOL) 500 MG tablet Take 500 mg by mouth daily as needed.     albuterol (PROAIR HFA) 108 (90 Base) MCG/ACT inhaler Inhale 2 puffs into the lungs every 4 (four) hours as needed for wheezing or shortness of breath. 18 g 2   albuterol (PROVENTIL) (2.5 MG/3ML) 0.083% nebulizer solution Take 3 mLs (2.5 mg total) by nebulization 4 (four) times daily as needed for wheezing or shortness of breath. 120 mL 2   atorvastatin (LIPITOR) 40 MG tablet Take 40 mg by mouth at bedtime.      azithromycin (ZITHROMAX) 500 MG tablet Take 500 mg by mouth daily.     Budeson-Glycopyrrol-Formoterol (BREZTRI AEROSPHERE) 160-9-4.8 MCG/ACT AERO Inhale 2 puffs into the lungs in the morning and at bedtime. 10.7 g 11   Budeson-Glycopyrrol-Formoterol (BREZTRI AEROSPHERE) 160-9-4.8 MCG/ACT AERO Inhale 2 puffs into the lungs in the morning and at bedtime. 5.9 g 0   diazepam (VALIUM) 5 MG tablet Take 5 mg by mouth at bedtime as needed.     ethambutol (MYAMBUTOL) 400 MG tablet Take 3 tablets (1,200 mg total) by mouth daily. 90 tablet 3   ferrous sulfate 325 (65 FE) MG tablet Take 325 mg by mouth at bedtime.     gabapentin (NEURONTIN) 100 MG capsule Take 1 capsule (100 mg total) by mouth at bedtime. 30 capsule 1   levothyroxine (SYNTHROID) 150 MCG tablet Take 150 mcg by mouth daily before breakfast.     lidocaine-prilocaine (EMLA) cream Apply 1 application topically as needed. 30 g 1   loratadine (CLARITIN) 10 MG tablet Take 10 mg by mouth daily.     magnesium oxide (MAG-OX) 400 MG tablet Take 600 mg by mouth 2 (two) times daily.  Takes 1.5 tablet twice daily     meloxicam (MOBIC)  15 MG tablet Take 1 tablet (15 mg total) by mouth daily. Take with largest meal of the day. 30 tablet 2   mirtazapine (REMERON) 7.5 MG tablet Take 1 tablet (7.5 mg total) by mouth at bedtime. 30 tablet 2   nitroGLYCERIN (NITROSTAT) 0.4 MG SL tablet Place under the tongue.     OXYGEN Inhale 2 L into the lungs at bedtime.     pantoprazole (PROTONIX) 40 MG tablet Take 1 tablet (40 mg total) by mouth 2 (two) times daily before meals     sodium chloride 1 g tablet Take 1 tablet (1 g total) by mouth 3 (three) times daily with meals. 90 tablet 1   tadalafil (CIALIS) 5 MG tablet Take 1 tablet (5 mg total) by mouth daily as needed for erectile dysfunction. 90 tablet 3   traMADol (ULTRAM) 50 MG tablet Take 50 mg by mouth every 8 (eight) hours as needed.     No current facility-administered medications for this visit.     Abtx:  Anti-infectives (From admission, onward)    None       REVIEW OF SYSTEMS:  Const: negative fever, negative chills, has  weight loss Eyes: negative diplopia or visual changes, negative eye pain ENT: negative coryza, negative sore throat Resp: cough, , dyspnea rt sided pleuritic chest pain Cards: negative for chest pain, palpitations, lower extremity edema GU: negative for frequency, dysuria and hematuria GI: poor appetite , weight loss Skin: negative for rash and pruritus Heme: negative for easy bruising and gum/nose bleeding MS: fatigue Neurolo:negative for headaches, dizziness, vertigo, memory problems  Psych:depression  Endocrine: negative for thyroid, diabetes Allergy/Immunology-as above: Continues to smoke 1 1/2 pack- drinks a fifth of whisky a week and some beer Objective:  VITALS:  BP 134/79   Pulse (!) 108   Temp 98.2 F (36.8 C) (Temporal)   Ht 6' (1.829 m)   Wt 177 lb (80.3 kg)   SpO2 97%   BMI 24.01 kg/m   PHYSICAL EXAM:  General: Alert, cooperative, no  distress at rest,  Lungs: b/l air  entry Rhonchi and crepts on rt side Heart: Regular rate and rhythm, no murmur, rub or gallop. Abdomen: did not examine Extremities: atraumatic, no cyanosis. No edema. No clubbing Skin: No rashes or lesions. Or bruising Lymph: Cervical, supraclavicular normal. Neurologic: Grossly non-focal  Labs    Latest Ref Rng & Units 05/03/2022    2:24 PM 03/31/2022    1:58 PM 03/02/2022    3:03 PM  CBC  WBC 4.0 - 10.5 K/uL 9.6  12.3  14.2   Hemoglobin 13.0 - 17.0 g/dL 12.8  12.7  11.5   Hematocrit 39.0 - 52.0 % 38.2  38.6  36.2   Platelets 150 - 400 K/uL 223  311  365        Latest Ref Rng & Units 06/29/2022   12:58 PM 06/01/2022    7:10 PM 05/03/2022    2:24 PM  CMP  Glucose 70 - 99 mg/dL  107  104   BUN 8 - 23 mg/dL _0 Creatinine 0.61 - 1.24 mg/dL 0.63  0.77  0.61   Sodium 135 - 145 mmol/L  135  126   Potassium 3.5 - 5.1 mmol/L  4.4  3.9   Chloride 98 - 111 mmol/L  98  95   CO2 22 - 32 mmol/L  27  24   Calcium 8.9 - 10.3 mg/dL  9.0  8.4   Total Protein  6.5 - 8.1 g/dL   6.6   Total Bilirubin 0.3 - 1.2 mg/dL   0.3   Alkaline Phos 38 - 126 U/L   94   AST 15 - 41 U/L   15   ALT 0 - 44 U/L   10      IMAGING RESULTS:  I have personally reviewed the films  ? Impression/Recommendation 66 year-old male current smoker and alcohol use with  recurrent non-small cell CA lung on the right side.  In 2019 had undergone chemo, radiation and immunotherapy Since January 2023 he had taken 4 cycles of palliative chemotherapy. No biopsy was taken the second time around because of C2 fracture  Neck cavitary/necrotizing mass on the right upper lobe/middle lobe This is a combination of malignancy/MACinfection and with underlying COPD and radiation damage. Currently on Mac treatment with azithromycin and ethambutol- will add rifampin 639m once a day  Hyperlipidemia on atorvastatin. With rifampin the level can decrease or increase- he has no stents- it may be okay to stop atorvastatin for a few weeks  or reduce the dose to 17m COPD  Hypothyroidism on Synthroid Will do labs in 2 weeks   follow-up 1 month.  Discussed the management with his wife on the phone Discussed with Dr.[name] __________________________________________________  Note:  This document was prepared using Dragon voice recognition software and may include unintentional dictation errors.

## 2022-07-20 NOTE — Progress Notes (Signed)
[place] Work  Clinical Social Work was referred by medical provider for assessment of psychosocial needs.  Clinical Social Worker contacted patient by phone  to offer support and assess for needs.  Patient was waiting for his doctor, CSW stated I would call him back later today.  Patient was interested in scheduling appointment for counseling.      Adelene Amas, [place] Worker [facility]

## 2022-07-22 ENCOUNTER — Inpatient Hospital Stay: Payer: Medicare Other | Admitting: Licensed Clinical Social Worker

## 2022-07-22 DIAGNOSIS — C3491 Malignant neoplasm of unspecified part of right bronchus or lung: Secondary | ICD-10-CM

## 2022-07-22 NOTE — Progress Notes (Signed)
[place] Work  Initial Assessment   Paul Carpenter is a 66 y.o. year old male contacted by phone. Clinical Social Work was referred by medical provider for assessment of psychosocial needs.   SDOH (Social Determinants of Health) assessments performed: Yes SDOH Interventions    Flowsheet Row Clinical Support from 07/22/2022 in [facility] Interventions   Food Insecurity Interventions Intervention Not Indicated  Housing Interventions Intervention Not Indicated  Transportation Interventions Intervention Not Indicated, Patient Resources (Friends/Family)  Utilities Interventions Intervention Not Indicated  Alcohol Usage Interventions Other (Comment)  Depression Interventions/Treatment  Counseling  Financial Strain Interventions Intervention Not Indicated  Physical Activity Interventions Intervention Not Indicated  Stress Interventions Provide Counseling  Social Connections Interventions Intervention Not Indicated       SDOH Screenings   Food Insecurity: No Food Insecurity (07/22/2022)  Housing: Low Risk  (07/22/2022)  Transportation Needs: No Transportation Needs (07/22/2022)  Utilities: Not At Risk (07/22/2022)  Alcohol Screen: Medium Risk (07/22/2022)  Depression (PHQ2-9): Medium Risk (07/22/2022)  Financial Resource Strain: Low Risk  (07/22/2022)  Physical Activity: Inactive (07/22/2022)  Social Connections: Moderately Integrated (07/22/2022)  Stress: Stress Concern Present (07/22/2022)  Tobacco Use: High Risk (07/20/2022)     Distress Screen completed: No    05/06/2015    3:50 PM  ONCBCN DISTRESS SCREENING  Screening Type Initial Screening  Distress experienced in past week (1-10) 0      Family/Social Information:  Housing Arrangement: patient lives with spouse,  Paul, Carpenter [phone] Family members/support persons in your life? Family, Friends, Medical laboratory scientific officer, and Geophysical data processor concerns: no  Employment:  Retired  .  Income source: Paediatric nurse concerns:  No financial concerns identified at this time Type of concern: None Food access concerns: no Religious or spiritual practice: Not known Services Currently in place:  Medicare A&B, BCBS Supplement  Coping/ Adjustment to diagnosis: Patient understands treatment plan and what happens next? yes, but expressed frustration with the amount of medication he is taking and how it is affecting his appetite and energy. Concerns about diagnosis and/or treatment: Pain or discomfort during procedures, How I will care for other members of my family, Overwhelmed by information, How will I care for myself, and Quality of life Patient reported stressors: Anxiety/ nervousness, Adjusting to my illness, Facing my mortality, and Physical issues Hopes and/or priorities: N/A Patient enjoys being outside and time with family/ friends Current coping skills/ strengths: Ability for insight , Average or above average intelligence , Capable of independent living , Communication skills , Financial means , Supportive family/friends , and Work skills     SUMMARY: Current SDOH Barriers:  No SDOH barriers identified.  Patient continues to drink alcohol and smoke regularly.  Clinical Social Work Clinical Goal(s):  Patient will work with SW to address concerns related to anxiety, depression and adjustment to diagnosis and treatment.  Interventions: Discussed common feeling and emotions when being diagnosed with cancer, and the importance of support during treatment Informed patient of the support team roles and support services at [facility] Provided CSW contact information and encouraged patient to call with any questions or concerns Provided patient with information about CSW role in patient care and other available resources   Follow Up Plan: Patient will contact CSW with any support or resource needs and CSW will see patient on 10/5 at 9:15  AM Patient verbalizes understanding of plan: Yes    Adelene Amas, LCSW

## 2022-07-29 ENCOUNTER — Telehealth: Payer: Self-pay | Admitting: Pharmacist

## 2022-07-29 ENCOUNTER — Telehealth: Payer: Self-pay

## 2022-07-29 NOTE — Telephone Encounter (Signed)
Patient's wife called stating since patient started the Rifampin he has been having a lot of nausea, vomiting and loss of appetite. Cassie or Estill Bamberg can you guys assist with this. Patient currently already taking Zofran  Hancel Ion [name]

## 2022-07-29 NOTE — Telephone Encounter (Signed)
Please see telephone note. Thanks! Estill Bamberg

## 2022-07-29 NOTE — Telephone Encounter (Signed)
Discussed patient's symptoms over the phone today. Started rifampin on 9/20 and has been on ethambutol and azithromycin since June. States he was experiencing mild nausea and appetite changes before starting rifampin but that the symptoms have worsened since starting rifampin.  He typically takes rifampin first thing in the morning and then waits an hour before eating breakfast. Sometimes eats breakfast first and waits a couple hours before taking rifampin. Recommended taking rifampin right before bed to try and sleep through symptoms. States he does not sleep well at night anyway. Currently takes Zofran ODT 4mg  as needed which does seem to help relieve symptoms.  Discussed new plan to prevent nausea: will eat breakfast and take Zofran ODT 4mg  first thing in the morning. Then will take rifampin 1-2 hours after breakfast. Encouraged him to take Zofran 4mg  every 8 hours to prevent nausea in the first place. Recommended he call us if he is running low on Zofran; we can refill as needed. Also discussed we can increase the Zofran dose as well. Asked him to please follow up with [place] early next week if he has not experienced any relief.  Alfonse Spruce, PharmD, CPP, [place] Clinical Pharmacist Practitioner [facility] [place] [facility]

## 2022-08-03 ENCOUNTER — Ambulatory Visit
Admission: RE | Admit: 2022-08-03 | Discharge: 2022-08-03 | Disposition: A | Discharge status: Home / Self Care | Payer: Medicare Other | Source: Ambulatory Visit | Attending: Pulmonary Disease | Admitting: Pulmonary Disease

## 2022-08-03 ENCOUNTER — Ambulatory Visit: Payer: BC Managed Care – PPO | Admitting: Infectious Diseases

## 2022-08-03 ENCOUNTER — Other Ambulatory Visit
Admission: RE | Admit: 2022-08-03 | Discharge: 2022-08-03 | Disposition: A | Discharge status: Home / Self Care | Payer: Medicare Other | Source: Ambulatory Visit | Attending: Infectious Diseases | Admitting: Infectious Diseases

## 2022-08-03 DIAGNOSIS — A310 Pulmonary mycobacterial infection: Secondary | ICD-10-CM | POA: Insufficient documentation

## 2022-08-03 DIAGNOSIS — R911 Solitary pulmonary nodule: Secondary | ICD-10-CM | POA: Insufficient documentation

## 2022-08-03 DIAGNOSIS — E222 Syndrome of inappropriate secretion of antidiuretic hormone: Secondary | ICD-10-CM | POA: Diagnosis not present

## 2022-08-03 DIAGNOSIS — E871 Hypo-osmolality and hyponatremia: Secondary | ICD-10-CM | POA: Diagnosis not present

## 2022-08-03 LAB — CBC WITH DIFFERENTIAL/PLATELET
Abs Immature Granulocytes: 0.01 10*3/uL (ref 0.00–0.07)
Basophils Absolute: 0.1 10*3/uL (ref 0.0–0.1)
Basophils Relative: 1 %
Eosinophils Absolute: 0 10*3/uL (ref 0.0–0.5)
Eosinophils Relative: 0 %
HCT: 36.3 % — ABNORMAL LOW (ref 39.0–52.0)
Hemoglobin: 12.9 g/dL — ABNORMAL LOW (ref 13.0–17.0)
Immature Granulocytes: 0 %
Lymphocytes Relative: 7 %
Lymphs Abs: 0.5 10*3/uL — ABNORMAL LOW (ref 0.7–4.0)
MCH: 32.4 pg (ref 26.0–34.0)
MCHC: 35.5 g/dL (ref 30.0–36.0)
MCV: 91.2 fL (ref 80.0–100.0)
Monocytes Absolute: 0.7 10*3/uL (ref 0.1–1.0)
Monocytes Relative: 10 %
Neutro Abs: 5.9 10*3/uL (ref 1.7–7.7)
Neutrophils Relative %: 82 %
Platelets: 270 10*3/uL (ref 150–400)
RBC: 3.98 MIL/uL — ABNORMAL LOW (ref 4.22–5.81)
RDW: 12.1 % (ref 11.5–15.5)
WBC: 7.1 10*3/uL (ref 4.0–10.5)
nRBC: 0 % (ref 0.0–0.2)

## 2022-08-03 LAB — COMPREHENSIVE METABOLIC PANEL
ALT: 9 U/L (ref 0–44)
AST: 16 U/L (ref 15–41)
Albumin: 3.7 g/dL (ref 3.5–5.0)
Alkaline Phosphatase: 98 U/L (ref 38–126)
Anion gap: 9 (ref 5–15)
BUN: 6 mg/dL — ABNORMAL LOW (ref 8–23)
CO2: 26 mmol/L (ref 22–32)
Calcium: 8.6 mg/dL — ABNORMAL LOW (ref 8.9–10.3)
Chloride: 85 mmol/L — ABNORMAL LOW (ref 98–111)
Creatinine, Ser: 0.55 mg/dL — ABNORMAL LOW (ref 0.61–1.24)
GFR, Estimated: >60 mL/min (ref >60)
Glucose, Bld: 81 mg/dL (ref 70–99)
Potassium: 4.2 mmol/L (ref 3.5–5.1)
Sodium: 120 mmol/L — ABNORMAL LOW (ref 135–145)
Total Bilirubin: 0.5 mg/dL (ref 0.3–1.2)
Total Protein: 6.6 g/dL (ref 6.5–8.1)

## 2022-08-03 NOTE — Progress Notes (Unsigned)
[place]  [phone] [phone]) [phone]  ID: Paul Carpenter OB: 1956-07-03  MR#: [id number]  SAY#:[id number]  Patient Care Team: [name], MD as PCP - General (Internal Medicine) [name], MD as Referring Physician (Vascular Surgery) [name], MD as Consulting Physician (Oncology) [name], MD as Referring Physician (Radiation Oncology)   CHIEF COMPLAINT: Recurrent stage IIIa non-small cell lung cancer, right middle lobe.  INTERVAL HISTORY: Patient returns to clinic today for laboratory work and routine 36-monthevaluation.  He continues to have chronic weakness and fatigue, chronic cough, and persistent shortness of breath.  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  He can needs to be followed closely by both pulmonary and infectious disease.  He denies any hemoptysis.  He continues to smoke.  He has no neurologic complaints.  He denies any fevers. He denies any nausea, vomiting, constipation, or diarrhea. He has no urinary complaints.  Patient offers no further specific complaints today.    REVIEW OF SYSTEMS:   Review of Systems  Constitutional:  Positive for malaise/fatigue. Negative for fever and weight loss.  HENT: Negative.  Negative for congestion and sore throat.   Respiratory:  Positive for cough and shortness of breath. Negative for hemoptysis.   Cardiovascular:  Positive for chest pain. Negative for leg swelling.  Gastrointestinal: Negative.  Negative for abdominal pain, blood in stool, melena and nausea.  Genitourinary: Negative.  Negative for dysuria.  Musculoskeletal: Negative.  Negative for joint pain and neck pain.  Skin: Negative.  Negative for rash.  Neurological:  Positive for weakness. Negative for dizziness, sensory change, focal weakness and headaches.  Psychiatric/Behavioral:  The patient is nervous/anxious.     As per HPI. Otherwise, a complete review of systems is  negative.  PAST MEDICAL HISTORY: Past Medical History:  Diagnosis Date   [place]    Anxiety    Asthma    Chest pain    CHF (congestive heart failure) (HCC)    Chicken pox    Complication of anesthesia    o2 dropped after neck fusion   COPD (chronic obstructive pulmonary disease) (HCC)    Coronary artery disease    Cough    chronic  clear phlegm   Dysrhythmia    palpitations   GERD (gastroesophageal reflux disease)    h/o reflux/ hoarsness   Hematochezia    Hemorrhoids    History of chickenpox    History of colon polyps    History of Helicobacter pylori infection    Hoarseness    Hypertension    [place] 05[place]   Chemo + rad tx's.    Migraines    OSA (obstructive sleep apnea)    has CPAP but does not use   Personal history of tobacco use, presenting hazards to health 03/05/2016   Pneumonia    5/17   Raynaud disease    Raynaud disease    Raynaud's disease    Rotator cuff tear    on right   Shortness of breath dyspnea    Sleep apnea    Ulcer (traumatic) of oral mucosa     PAST SURGICAL HISTORY: Past Surgical History:  Procedure Laterality Date   BACK SURGERY     cervical fusion x 2   CARDIAC CATHETERIZATION     CERVICAL DISCECTOMY     x 2   COLONOSCOPY     COLONOSCOPY N/A 07/25/2015   Procedure: COLONOSCOPY;  Surgeon: MLollie Sails MD;  Location: [facility] ENDOSCOPY;  Service: Endoscopy;  Laterality: N/A;   COLONOSCOPY WITH PROPOFOL N/A 10/04/2017   Procedure: COLONOSCOPY WITH PROPOFOL;  Surgeon: Lollie Sails, MD;  Location: [facility] ENDOSCOPY;  Service: Endoscopy;  Laterality: N/A;   COLONOSCOPY WITH PROPOFOL N/A 07/10/2020   Procedure: COLONOSCOPY WITH PROPOFOL;  Surgeon: Toledo, Benay Pike, MD;  Location: [facility] ENDOSCOPY;  Service: Gastroenterology;  Laterality: N/A;   ELECTROMAGNETIC NAVIGATION BROCHOSCOPY Left 06/28/2016   Procedure: ELECTROMAGNETIC NAVIGATION BRONCHOSCOPY;  Surgeon: Vilinda Boehringer, MD;  Location: [facility] ORS;  Service:  Cardiopulmonary;  Laterality: Left;   ENDOBRONCHIAL ULTRASOUND N/A 04/11/2018   Procedure: ENDOBRONCHIAL ULTRASOUND;  Surgeon: [name], MD;  Location: [facility] ORS;  Service: Cardiopulmonary;  Laterality: N/A;   ESOPHAGOGASTRODUODENOSCOPY N/A 07/25/2015   Procedure: ESOPHAGOGASTRODUODENOSCOPY (EGD);  Surgeon: Lollie Sails, MD;  Location: [facility] ENDOSCOPY;  Service: Endoscopy;  Laterality: N/A;   ESOPHAGOGASTRODUODENOSCOPY (EGD) WITH PROPOFOL N/A 07/10/2020   Procedure: ESOPHAGOGASTRODUODENOSCOPY (EGD) WITH PROPOFOL;  Surgeon: Toledo, Benay Pike, MD;  Location: [facility] ENDOSCOPY;  Service: Gastroenterology;  Laterality: N/A;   NASAL SINUS SURGERY     x 2    PORTA CATH INSERTION N/A 04/24/2018   Procedure: PORTA CATH INSERTION;  Surgeon: Algernon Huxley, MD;  Location: [place] CV LAB;  Service: Cardiovascular;  Laterality: N/A;   rotator cuff surgery Right    07/2016   SEPTOPLASTY     SKIN GRAFT      FAMILY HISTORY Family History  Problem Relation Age of Onset   Heart disease Father    Prostate cancer Father    Heart disease Paternal Grandmother    Heart attack Maternal Grandfather 4   Kidney cancer Neg Hx    Bladder Cancer Neg Hx    Other Neg Hx        pituitary abnormality       ADVANCED DIRECTIVES:    HEALTH MAINTENANCE: Social History   Tobacco Use   Smoking status: Every Day    Packs/day: 3.00    Years: 50.00    Total pack years: 150.00    Types: Cigarettes   Smokeless tobacco: Never   Tobacco comments:    Smoking less than 2 ppd.  Trying to cut back.  06/29/2022 hfb  Vaping Use   Vaping Use: Never used  Substance Use Topics   Alcohol use: Yes    Alcohol/week: 2.0 standard drinks of alcohol    Types: 2 Standard drinks or equivalent per week    Comment: moderate   Drug use: No     Allergies  Allergen Reactions   Lisinopril Rash   Varenicline Rash    Current Outpatient Medications  Medication Sig Dispense Refill   acetaminophen (TYLENOL) 500 MG tablet  Take 500 mg by mouth daily as needed.     albuterol (PROAIR HFA) 108 (90 Base) MCG/ACT inhaler Inhale 2 puffs into the lungs every 4 (four) hours as needed for wheezing or shortness of breath. 18 g 2   albuterol (PROVENTIL) (2.5 MG/3ML) 0.083% nebulizer solution Take 3 mLs (2.5 mg total) by nebulization 4 (four) times daily as needed for wheezing or shortness of breath. 120 mL 2   atorvastatin (LIPITOR) 40 MG tablet Take 40 mg by mouth at bedtime.      azithromycin (ZITHROMAX) 500 MG tablet Take 500 mg by mouth daily.     Budeson-Glycopyrrol-Formoterol (BREZTRI AEROSPHERE) 160-9-4.8 MCG/ACT AERO Inhale 2 puffs into the lungs in the morning and at bedtime. 10.7 g 11   Budeson-Glycopyrrol-Formoterol (BREZTRI AEROSPHERE) 160-9-4.8 MCG/ACT  AERO Inhale 2 puffs into the lungs in the morning and at bedtime. 5.9 g 0   diazepam (VALIUM) 5 MG tablet Take 5 mg by mouth at bedtime as needed.     ethambutol (MYAMBUTOL) 400 MG tablet Take 3 tablets (1,200 mg total) by mouth daily. 90 tablet 3   ferrous sulfate 325 (65 FE) MG tablet Take 325 mg by mouth at bedtime.     gabapentin (NEURONTIN) 100 MG capsule Take 1 capsule (100 mg total) by mouth at bedtime. 30 capsule 1   levothyroxine (SYNTHROID) 150 MCG tablet Take 150 mcg by mouth daily before breakfast.     lidocaine-prilocaine (EMLA) cream Apply 1 application topically as needed. 30 g 1   magnesium oxide (MAG-OX) 400 MG tablet Take 600 mg by mouth 2 (two) times daily. Takes 1.5 tablet twice daily     nitroGLYCERIN (NITROSTAT) 0.4 MG SL tablet Place under the tongue.     OXYGEN Inhale 2 L into the lungs at bedtime.     pantoprazole (PROTONIX) 40 MG tablet Take 1 tablet (40 mg total) by mouth 2 (two) times daily before meals     rifampin (RIFADIN) 300 MG capsule Take 2 capsules (600 mg total) by mouth daily. 60 capsule 1   sodium chloride 1 g tablet Take 1 tablet (1 g total) by mouth 3 (three) times daily with meals. 90 tablet 1   tadalafil (CIALIS) 5 MG  tablet Take 1 tablet (5 mg total) by mouth daily as needed for erectile dysfunction. 90 tablet 3   loratadine (CLARITIN) 10 MG tablet Take 10 mg by mouth daily. (Patient not taking: Reported on 08/05/2022)     meloxicam (MOBIC) 15 MG tablet Take 1 tablet (15 mg total) by mouth daily. Take with largest meal of the day. (Patient not taking: Reported on 08/05/2022) 30 tablet 2   mirtazapine (REMERON) 7.5 MG tablet Take 1 tablet (7.5 mg total) by mouth at bedtime. (Patient not taking: Reported on 08/05/2022) 30 tablet 2   traMADol (ULTRAM) 50 MG tablet Take 50 mg by mouth every 8 (eight) hours as needed. (Patient not taking: Reported on 08/05/2022)     No current facility-administered medications for this visit.    OBJECTIVE: Vitals:   08/05/22 1052  BP: 118/76  Pulse: 84  Resp: 18  Temp: 97.9 F (36.6 C)  SpO2: 100%      Body mass index is 24.01 kg/m.    ECOG FS:2 - Symptomatic, <50% confined to bed  General: Ill appearing, no acute distress. Eyes: Pink conjunctiva, anicteric sclera. HEENT: Normocephalic, moist mucous membranes. Lungs: No audible wheezing or coughing. Heart: Regular rate and rhythm. Abdomen: Soft, nontender, no obvious distention. Musculoskeletal: No edema, cyanosis, or clubbing. Neuro: Alert, answering all questions appropriately. Cranial nerves grossly intact. Skin: No rashes or petechiae noted. Psych: Normal affect.    LAB RESULTS:  Lab Results  Component Value Date   NA 115 (LL) 08/05/2022   K 4.4 08/05/2022   CL 84 (L) 08/05/2022   CO2 24 08/05/2022   GLUCOSE 83 08/05/2022   BUN <5 (L) 08/05/2022   CREATININE 0.31 (L) 08/05/2022   CALCIUM 8.4 (L) 08/05/2022   PROT 6.6 08/05/2022   ALBUMIN 3.7 08/05/2022   AST 16 08/05/2022   ALT 10 08/05/2022   ALKPHOS 93 08/05/2022   BILITOT 0.4 08/05/2022   GFRNONAA >60 08/05/2022   GFRAA >60 04/24/2020    Lab Results  Component Value Date   WBC 7.0 08/05/2022   NEUTROABS  5.8 08/05/2022   HGB 13.0  08/05/2022   HCT 35.0 (L) 08/05/2022   MCV 89.1 08/05/2022   PLT 326 08/05/2022   Lab Results  Component Value Date   IRON 49 12/28/2021   TIBC 241 (L) 12/28/2021   IRONPCTSAT 20 12/28/2021   Lab Results  Component Value Date   FERRITIN 318 11/20/2021     STUDIES:  ONCOLOGY HISTORY: Case discussed with pathologist and unable to determine whether this is adenocarcinoma or squamous cell carcinoma.  There is also insufficient tissue to do further testing.  Liquid biopsy did not reveal any actionable mutations.  MRI of the brain completed on Mar 28, 2018 reviewed independently did not reveal metastatic disease.  Patient completed XRT June 26, 2018.  He completed his concurrent single agent carboplatinum on June 21, 2018.  Patient had a reaction to Taxol during cycle 1 and this was discontinued.  He completed a year-long of maintenance durvalumab on June 27, 2019.   PET scan results from November 16, 2021 reviewed independently with once again suspicion of progression of disease.  After lengthy discussion with the patient, it was agreed that no biopsy is necessary and may be too difficult given his recent C2 cervical fracture.  Patient and family expressed interest in systemic chemotherapy.  Patient received his fourth and final cycle of Taxotere on January 28, 2022.    ASSESSMENT: Recurrent stage IIIa non-small cell lung cancer, right middle lobe.  PLAN:    1.  Recurrent stage IIIa non-small cell lung cancer, right middle lobe: See oncology history above.  No treatments are planned at this time while patient receiving extended antibiotic treatment for MAC.  PET scan on April 29, 2022 reviewed independently revealing persistent hypermetabolism that is likely more related to his MAC infection than progressive disease.  Repeat CT scan on August 03, 2022.  To be essentially stable with persistent abnormalities consistent with his MAC infection.  No further treatments are planned at this time.   Continue close follow-up with infectious disease and pulmonary.  Return to clinic in 3 months for routine evaluation.   2.  Iron deficiency anemia: Resolved.  He last received IV Venofer on December 03, 2021.   3.  Colon polyps:  Patient has a personal history of greater then 10 adenomatous polyps on his most recent conoloscopy. He does not know of any family history of increased polyps or colon cancer.  Genetic testing to assess for the APC mutation for FAP or AFAP was negative. Continue colonoscopies as per GI. 4. Tobacco Use: Chronic and unchanged.  Patient continues to heavily smoke.  He previously expressed understanding by continuing tobacco use increases his chance of recurrence. 5. Anxiety: Chronic and unchanged.  Continue Valium 10 mg every 12 hours as needed.  Continue treatment and evaluation per primary care.  IV Ativan has also been added to his premedication regimen. 6.  Hyponatremia: Patient's sodium levels are significantly worse and down to 115 today.  Repeat laboratory testing confirmed the results.  We discussed the possibility of going to the emergency room, which patient declined.  Continue salt tablets as prescribed by nephrology.  Patient was previously counseled on alcohol consumption and how it can act as a diuretic and reduce sodium levels.   7.  Hypothyroidism: Appreciate endocrinology input.  Continue Synthroid as prescribed.  Follow-up with endocrinology as scheduled. 8.  CHF: Patient has an EF of 40%.  Continue evaluation and treatment per cardiology. 9.  Nausea: Patient does not complain of  this today.  Continue Zofran as needed. 10.  C2 cervical fracture: Patient had repeat imaging on May 03, 2022 that revealed complete resolution of fracture.   11.  Cough/shortness of breath: Chronic and unchanged.  Continue treatment for MAC as above.  Patient also continues to smoke.  Patient expressed understanding and was in agreement with this plan. He also understands that He can  call clinic at any time with any questions, concerns, or complaints.    [name], MD   08/05/2022 4:49 PM

## 2022-08-05 ENCOUNTER — Inpatient Hospital Stay (HOSPITAL_BASED_OUTPATIENT_CLINIC_OR_DEPARTMENT_OTHER): Payer: Medicare Other | Admitting: Oncology

## 2022-08-05 ENCOUNTER — Inpatient Hospital Stay: Payer: Medicare Other | Attending: Oncology

## 2022-08-05 ENCOUNTER — Encounter: Payer: Self-pay | Admitting: Oncology

## 2022-08-05 ENCOUNTER — Other Ambulatory Visit: Payer: Self-pay

## 2022-08-05 ENCOUNTER — Inpatient Hospital Stay: Payer: Medicare Other | Admitting: Licensed Clinical Social Worker

## 2022-08-05 ENCOUNTER — Other Ambulatory Visit: Payer: Self-pay | Admitting: *Deleted

## 2022-08-05 VITALS — BP 118/76 | HR 84 | Temp 97.9°F | Resp 18 | Ht 72.0 in | Wt 177.0 lb

## 2022-08-05 DIAGNOSIS — C3491 Malignant neoplasm of unspecified part of right bronchus or lung: Secondary | ICD-10-CM | POA: Diagnosis not present

## 2022-08-05 DIAGNOSIS — Z923 Personal history of irradiation: Secondary | ICD-10-CM | POA: Diagnosis not present

## 2022-08-05 DIAGNOSIS — Z79899 Other long term (current) drug therapy: Secondary | ICD-10-CM | POA: Diagnosis not present

## 2022-08-05 DIAGNOSIS — F17210 Nicotine dependence, cigarettes, uncomplicated: Secondary | ICD-10-CM | POA: Insufficient documentation

## 2022-08-05 DIAGNOSIS — R053 Chronic cough: Secondary | ICD-10-CM | POA: Diagnosis not present

## 2022-08-05 DIAGNOSIS — C342 Malignant neoplasm of middle lobe, bronchus or lung: Secondary | ICD-10-CM | POA: Diagnosis present

## 2022-08-05 DIAGNOSIS — G4733 Obstructive sleep apnea (adult) (pediatric): Secondary | ICD-10-CM | POA: Insufficient documentation

## 2022-08-05 DIAGNOSIS — E039 Hypothyroidism, unspecified: Secondary | ICD-10-CM | POA: Diagnosis not present

## 2022-08-05 DIAGNOSIS — I110 Hypertensive heart disease with heart failure: Secondary | ICD-10-CM | POA: Insufficient documentation

## 2022-08-05 DIAGNOSIS — R0602 Shortness of breath: Secondary | ICD-10-CM | POA: Insufficient documentation

## 2022-08-05 DIAGNOSIS — Z95828 Presence of other vascular implants and grafts: Secondary | ICD-10-CM

## 2022-08-05 DIAGNOSIS — E871 Hypo-osmolality and hyponatremia: Secondary | ICD-10-CM | POA: Diagnosis not present

## 2022-08-05 DIAGNOSIS — I509 Heart failure, unspecified: Secondary | ICD-10-CM | POA: Diagnosis not present

## 2022-08-05 DIAGNOSIS — I2510 Atherosclerotic heart disease of native coronary artery without angina pectoris: Secondary | ICD-10-CM | POA: Insufficient documentation

## 2022-08-05 DIAGNOSIS — F419 Anxiety disorder, unspecified: Secondary | ICD-10-CM | POA: Insufficient documentation

## 2022-08-05 LAB — COMPREHENSIVE METABOLIC PANEL
ALT: 10 U/L (ref 0–44)
ALT: 10 U/L (ref 0–44)
AST: 14 U/L — ABNORMAL LOW (ref 15–41)
AST: 16 U/L (ref 15–41)
Albumin: 3.7 g/dL (ref 3.5–5.0)
Albumin: 3.7 g/dL (ref 3.5–5.0)
Alkaline Phosphatase: 95 U/L (ref 38–126)
Alkaline Phosphatase: 93 U/L (ref 38–126)
Anion gap: 6 (ref 5–15)
Anion gap: 7 (ref 5–15)
BUN: <5 mg/dL — ABNORMAL LOW (ref 8–23)
BUN: <5 mg/dL — ABNORMAL LOW (ref 8–23)
CO2: 25 mmol/L (ref 22–32)
CO2: 24 mmol/L (ref 22–32)
Calcium: 8.3 mg/dL — ABNORMAL LOW (ref 8.9–10.3)
Calcium: 8.4 mg/dL — ABNORMAL LOW (ref 8.9–10.3)
Chloride: 84 mmol/L — ABNORMAL LOW (ref 98–111)
Chloride: 84 mmol/L — ABNORMAL LOW (ref 98–111)
Creatinine, Ser: 0.46 mg/dL — ABNORMAL LOW (ref 0.61–1.24)
Creatinine, Ser: 0.31 mg/dL — ABNORMAL LOW (ref 0.61–1.24)
GFR, Estimated: >60 mL/min (ref >60)
GFR, Estimated: >60 mL/min (ref >60)
Glucose, Bld: 92 mg/dL (ref 70–99)
Glucose, Bld: 83 mg/dL (ref 70–99)
Potassium: 4.3 mmol/L (ref 3.5–5.1)
Potassium: 4.4 mmol/L (ref 3.5–5.1)
Sodium: 115 mmol/L — CL (ref 135–145)
Sodium: 115 mmol/L — CL (ref 135–145)
Total Bilirubin: 0.6 mg/dL (ref 0.3–1.2)
Total Bilirubin: 0.4 mg/dL (ref 0.3–1.2)
Total Protein: 6.4 g/dL — ABNORMAL LOW (ref 6.5–8.1)
Total Protein: 6.6 g/dL (ref 6.5–8.1)

## 2022-08-05 LAB — CBC WITH DIFFERENTIAL/PLATELET
Abs Immature Granulocytes: 0.01 10*3/uL (ref 0.00–0.07)
Basophils Absolute: 0.1 10*3/uL (ref 0.0–0.1)
Basophils Relative: 1 %
Eosinophils Absolute: 0 10*3/uL (ref 0.0–0.5)
Eosinophils Relative: 0 %
HCT: 35 % — ABNORMAL LOW (ref 39.0–52.0)
Hemoglobin: 13 g/dL (ref 13.0–17.0)
Immature Granulocytes: 0 %
Lymphocytes Relative: 7 %
Lymphs Abs: 0.5 10*3/uL — ABNORMAL LOW (ref 0.7–4.0)
MCH: 33.1 pg (ref 26.0–34.0)
MCHC: 37.1 g/dL — ABNORMAL HIGH (ref 30.0–36.0)
MCV: 89.1 fL (ref 80.0–100.0)
Monocytes Absolute: 0.6 10*3/uL (ref 0.1–1.0)
Monocytes Relative: 9 %
Neutro Abs: 5.8 10*3/uL (ref 1.7–7.7)
Neutrophils Relative %: 83 %
Platelets: 326 10*3/uL (ref 150–400)
RBC: 3.93 MIL/uL — ABNORMAL LOW (ref 4.22–5.81)
RDW: 11.7 % (ref 11.5–15.5)
WBC: 7 10*3/uL (ref 4.0–10.5)
nRBC: 0 % (ref 0.0–0.2)

## 2022-08-05 MED ORDER — DRONABINOL 5 MG PO CAPS: 5.0000 mg | ORAL_CAPSULE | Freq: Two times a day (BID) | ORAL | 0 refills | Status: DC

## 2022-08-05 MED ORDER — HEPARIN SOD (PORK) LOCK FLUSH 100 UNIT/ML IV SOLN
500.0000 [IU] | Freq: Once | INTRAVENOUS | Status: AC
  Administered 2022-08-05: 500 [IU]
  Filled 2022-08-05: qty 5

## 2022-08-05 MED ORDER — SODIUM CHLORIDE 0.9% FLUSH
10.0000 mL | Freq: Once | INTRAVENOUS | Status: AC
  Administered 2022-08-05: 10 mL via INTRAVENOUS
  Filled 2022-08-05: qty 10

## 2022-08-05 MED ORDER — HEPARIN SOD (PORK) LOCK FLUSH 100 UNIT/ML IV SOLN
500.0000 [IU] | Freq: Once | INTRAVENOUS | Status: AC
  Administered 2022-08-05: 500 [IU] via INTRAVENOUS
  Filled 2022-08-05: qty 5

## 2022-08-05 NOTE — Progress Notes (Signed)
[place] CSW Counseling Note  Patient was referred by medical provider. Treatment type:  Individual, spouse joined the second half of the session  Presenting Concerns: Patient and/or family reports the following symptoms/concerns: anxiety, depression, stress, and adjustment to diagnosis and treatment Duration of problem: 1 years; Severity of problem: moderate   Orientation:oriented to person, place, time/date, situation, day of week, month of year, and year.   Affect: Appropriate Risk of harm to self or others: No plan to harm self or others  Patient and/or Family's Strengths/Protective Factors: Social connections, Concrete supports in place (healthy food, safe environments, etc.), Physical Health (exercise, healthy diet, medication compliance, etc.), and Caregiver has knowledge of parenting & child developmentAverage or above average intelligence  Capable of independent living  Tax inspector fund of knowledge  Motivation for treatment/growth  Supportive family/friends  Work skills      Goals Addressed: Patient will:  Reduce symptoms of: anxiety, depression, stress, and adjustment to diagnosis and treatment Increase knowledge and/or ability of: coping skills, healthy habits, stress reduction, and communication, self-advocacy   Increase healthy adjustment to current life circumstances, Begin healthy grieving over loss, and increase acceptance of curr   Progress towards Goals: Initial   Interventions: Interventions utilized:  CBT, Solution Focused, Strength-based, Family Systems, and Reframing      Assessment: Patient currently experiencing symptoms of depression, anxiety and adjustment concerns. Patient is frustrated with current medical situation and the amount of medications he is required to take.  Discussed ways to reduce stress and skills to use to reduce current issues and triggers.  Discussed self-advocacy when engaging with providers and self-determination in  reference to treatment and care choices.      Plan: Follow up with CSW: Patient's spouse will contact CSW to discuss next appointment Behavioral recommendations: continue using skills Referral(s): Support group(s):  N/A          Adelene Amas, LCSW

## 2022-08-05 NOTE — Progress Notes (Addendum)
I was asked by Dr. Grayland Ormond to recollect pt's metc due to hyponatremia. Labs re drawn to verify critical value. patient does not want to wait on test results.  Pt was educated to go to the emergency room to be admitted for hyponatremia. Pt declines admission/er visit. Pt stated that he would go home and take his sodium tablets instead. Pt instructed to limit his intake of fluids and drink only the amounts directed by his healthcare provider. Patient educated that hyponatremia (low sodium levels) can cause severe fatigue, confusion, fainting/ weakness/dizziness/muscle spasms/cramps, gait abnormality, N &V, severe tiredness and even seizures. Pt adeqmately wants to go home and declines ER/admission.

## 2022-08-06 ENCOUNTER — Inpatient Hospital Stay
Admission: EM | Admit: 2022-08-06 | Discharge: 2022-08-11 | DRG: 644 | Disposition: A | Discharge status: Home / Self Care | Payer: Medicare Other | Attending: Internal Medicine | Admitting: Internal Medicine

## 2022-08-06 ENCOUNTER — Other Ambulatory Visit: Payer: Self-pay

## 2022-08-06 ENCOUNTER — Emergency Department: Payer: Medicare Other

## 2022-08-06 DIAGNOSIS — E222 Syndrome of inappropriate secretion of antidiuretic hormone: Principal | ICD-10-CM | POA: Diagnosis present

## 2022-08-06 DIAGNOSIS — Z7951 Long term (current) use of inhaled steroids: Secondary | ICD-10-CM | POA: Diagnosis not present

## 2022-08-06 DIAGNOSIS — I2510 Atherosclerotic heart disease of native coronary artery without angina pectoris: Secondary | ICD-10-CM | POA: Diagnosis present

## 2022-08-06 DIAGNOSIS — J449 Chronic obstructive pulmonary disease, unspecified: Secondary | ICD-10-CM | POA: Diagnosis present

## 2022-08-06 DIAGNOSIS — Z79899 Other long term (current) drug therapy: Secondary | ICD-10-CM

## 2022-08-06 DIAGNOSIS — Z8619 Personal history of other infectious and parasitic diseases: Secondary | ICD-10-CM | POA: Diagnosis not present

## 2022-08-06 DIAGNOSIS — K219 Gastro-esophageal reflux disease without esophagitis: Secondary | ICD-10-CM | POA: Diagnosis present

## 2022-08-06 DIAGNOSIS — F17210 Nicotine dependence, cigarettes, uncomplicated: Secondary | ICD-10-CM | POA: Diagnosis present

## 2022-08-06 DIAGNOSIS — A310 Pulmonary mycobacterial infection: Secondary | ICD-10-CM | POA: Diagnosis present

## 2022-08-06 DIAGNOSIS — Z9221 Personal history of antineoplastic chemotherapy: Secondary | ICD-10-CM

## 2022-08-06 DIAGNOSIS — Z888 Allergy status to other drugs, medicaments and biological substances status: Secondary | ICD-10-CM | POA: Diagnosis not present

## 2022-08-06 DIAGNOSIS — C3491 Malignant neoplasm of unspecified part of right bronchus or lung: Secondary | ICD-10-CM | POA: Diagnosis not present

## 2022-08-06 DIAGNOSIS — I10 Essential (primary) hypertension: Secondary | ICD-10-CM | POA: Diagnosis present

## 2022-08-06 DIAGNOSIS — G4733 Obstructive sleep apnea (adult) (pediatric): Secondary | ICD-10-CM | POA: Diagnosis present

## 2022-08-06 DIAGNOSIS — Z9981 Dependence on supplemental oxygen: Secondary | ICD-10-CM | POA: Diagnosis not present

## 2022-08-06 DIAGNOSIS — Z716 Tobacco abuse counseling: Secondary | ICD-10-CM

## 2022-08-06 DIAGNOSIS — I509 Heart failure, unspecified: Secondary | ICD-10-CM | POA: Diagnosis present

## 2022-08-06 DIAGNOSIS — E039 Hypothyroidism, unspecified: Secondary | ICD-10-CM | POA: Diagnosis present

## 2022-08-06 DIAGNOSIS — R110 Nausea: Secondary | ICD-10-CM | POA: Diagnosis not present

## 2022-08-06 DIAGNOSIS — R471 Dysarthria and anarthria: Secondary | ICD-10-CM | POA: Diagnosis present

## 2022-08-06 DIAGNOSIS — E782 Mixed hyperlipidemia: Secondary | ICD-10-CM | POA: Diagnosis present

## 2022-08-06 DIAGNOSIS — Z79890 Hormone replacement therapy (postmenopausal): Secondary | ICD-10-CM

## 2022-08-06 DIAGNOSIS — R112 Nausea with vomiting, unspecified: Secondary | ICD-10-CM | POA: Diagnosis not present

## 2022-08-06 DIAGNOSIS — I7300 Raynaud's syndrome without gangrene: Secondary | ICD-10-CM | POA: Diagnosis present

## 2022-08-06 DIAGNOSIS — F419 Anxiety disorder, unspecified: Secondary | ICD-10-CM | POA: Diagnosis present

## 2022-08-06 DIAGNOSIS — Z8249 Family history of ischemic heart disease and other diseases of the circulatory system: Secondary | ICD-10-CM

## 2022-08-06 DIAGNOSIS — I110 Hypertensive heart disease with heart failure: Secondary | ICD-10-CM | POA: Diagnosis present

## 2022-08-06 DIAGNOSIS — Z79891 Long term (current) use of opiate analgesic: Secondary | ICD-10-CM

## 2022-08-06 DIAGNOSIS — Z981 Arthrodesis status: Secondary | ICD-10-CM | POA: Diagnosis not present

## 2022-08-06 DIAGNOSIS — E871 Hypo-osmolality and hyponatremia: Secondary | ICD-10-CM | POA: Diagnosis not present

## 2022-08-06 DIAGNOSIS — Z923 Personal history of irradiation: Secondary | ICD-10-CM | POA: Diagnosis not present

## 2022-08-06 LAB — COMPREHENSIVE METABOLIC PANEL
ALT: 10 U/L (ref 0–44)
AST: 18 U/L (ref 15–41)
Albumin: 3.7 g/dL (ref 3.5–5.0)
Alkaline Phosphatase: 92 U/L (ref 38–126)
Anion gap: 7 (ref 5–15)
BUN: 5 mg/dL — ABNORMAL LOW (ref 8–23)
CO2: 26 mmol/L (ref 22–32)
Calcium: 8.4 mg/dL — ABNORMAL LOW (ref 8.9–10.3)
Chloride: 82 mmol/L — ABNORMAL LOW (ref 98–111)
Creatinine, Ser: 0.52 mg/dL — ABNORMAL LOW (ref 0.61–1.24)
GFR, Estimated: >60 mL/min (ref >60)
Glucose, Bld: 116 mg/dL — ABNORMAL HIGH (ref 70–99)
Potassium: 4.4 mmol/L (ref 3.5–5.1)
Sodium: 115 mmol/L — CL (ref 135–145)
Total Bilirubin: 0.5 mg/dL (ref 0.3–1.2)
Total Protein: 6.4 g/dL — ABNORMAL LOW (ref 6.5–8.1)

## 2022-08-06 LAB — CBC WITH DIFFERENTIAL/PLATELET
Abs Immature Granulocytes: 0.01 10*3/uL (ref 0.00–0.07)
Basophils Absolute: 0 10*3/uL (ref 0.0–0.1)
Basophils Relative: 1 %
Eosinophils Absolute: 0 10*3/uL (ref 0.0–0.5)
Eosinophils Relative: 0 %
HCT: 34.5 % — ABNORMAL LOW (ref 39.0–52.0)
Hemoglobin: 12.4 g/dL — ABNORMAL LOW (ref 13.0–17.0)
Immature Granulocytes: 0 %
Lymphocytes Relative: 8 %
Lymphs Abs: 0.4 10*3/uL — ABNORMAL LOW (ref 0.7–4.0)
MCH: 32 pg (ref 26.0–34.0)
MCHC: 35.9 g/dL (ref 30.0–36.0)
MCV: 88.9 fL (ref 80.0–100.0)
Monocytes Absolute: 0.5 10*3/uL (ref 0.1–1.0)
Monocytes Relative: 10 %
Neutro Abs: 4.3 10*3/uL (ref 1.7–7.7)
Neutrophils Relative %: 81 %
Platelets: 258 10*3/uL (ref 150–400)
RBC: 3.88 MIL/uL — ABNORMAL LOW (ref 4.22–5.81)
RDW: 11.9 % (ref 11.5–15.5)
WBC: 5.3 10*3/uL (ref 4.0–10.5)
nRBC: 0 % (ref 0.0–0.2)

## 2022-08-06 LAB — OSMOLALITY: Osmolality: 237 mOsm/kg — CL (ref 275–295)

## 2022-08-06 LAB — TROPONIN I (HIGH SENSITIVITY)
Troponin I (High Sensitivity): 5 ng/L (ref <18)
Troponin I (High Sensitivity): 4 ng/L (ref <18)

## 2022-08-06 LAB — SODIUM, URINE, RANDOM: Sodium, Ur: 77 mmol/L

## 2022-08-06 LAB — OSMOLALITY, URINE: Osmolality, Ur: 257 mOsm/kg — ABNORMAL LOW (ref 300–900)

## 2022-08-06 LAB — MAGNESIUM: Magnesium: 1.7 mg/dL (ref 1.7–2.4)

## 2022-08-06 LAB — GLUCOSE, CAPILLARY: Glucose-Capillary: 91 mg/dL (ref 70–99)

## 2022-08-06 LAB — SODIUM: Sodium: 117 mmol/L — CL (ref 135–145)

## 2022-08-06 MED ORDER — PANTOPRAZOLE SODIUM 40 MG PO TBEC
40.0000 mg | DELAYED_RELEASE_TABLET | Freq: Every day | ORAL | Status: DC
  Administered 2022-08-07 – 2022-08-11 (×5): 40 mg via ORAL
  Filled 2022-08-06 (×5): qty 1

## 2022-08-06 MED ORDER — AZITHROMYCIN 500 MG PO TABS
500.0000 mg | ORAL_TABLET | Freq: Every day | ORAL | Status: DC
  Administered 2022-08-07 – 2022-08-11 (×5): 500 mg via ORAL
  Filled 2022-08-06 (×5): qty 1

## 2022-08-06 MED ORDER — ATORVASTATIN CALCIUM 20 MG PO TABS
40.0000 mg | ORAL_TABLET | Freq: Every day | ORAL | Status: DC
  Administered 2022-08-07 – 2022-08-10 (×4): 40 mg via ORAL
  Filled 2022-08-06 (×4): qty 2

## 2022-08-06 MED ORDER — NITROGLYCERIN 0.4 MG SL SUBL: 0.4000 mg | SUBLINGUAL_TABLET | SUBLINGUAL | Status: DC | PRN

## 2022-08-06 MED ORDER — ONDANSETRON HCL 4 MG PO TABS
4.0000 mg | ORAL_TABLET | Freq: Four times a day (QID) | ORAL | Status: DC | PRN
  Administered 2022-08-07: 4 mg via ORAL
  Filled 2022-08-06: qty 1

## 2022-08-06 MED ORDER — MORPHINE SULFATE (PF) 2 MG/ML IV SOLN
2.0000 mg | INTRAVENOUS | Status: DC | PRN
  Administered 2022-08-07: 2 mg via INTRAVENOUS
  Filled 2022-08-06: qty 1

## 2022-08-06 MED ORDER — DIAZEPAM 5 MG PO TABS
5.0000 mg | ORAL_TABLET | Freq: Every evening | ORAL | Status: DC | PRN
  Administered 2022-08-06 – 2022-08-08 (×3): 5 mg via ORAL
  Filled 2022-08-06 (×3): qty 1

## 2022-08-06 MED ORDER — ENOXAPARIN SODIUM 40 MG/0.4ML IJ SOSY
40.0000 mg | PREFILLED_SYRINGE | INTRAMUSCULAR | Status: DC
  Administered 2022-08-07 – 2022-08-11 (×5): 40 mg via SUBCUTANEOUS
  Filled 2022-08-06 (×5): qty 0.4

## 2022-08-06 MED ORDER — RIFAMPIN 300 MG PO CAPS
600.0000 mg | ORAL_CAPSULE | Freq: Every day | ORAL | Status: DC
  Administered 2022-08-07 – 2022-08-11 (×5): 600 mg via ORAL
  Filled 2022-08-06 (×5): qty 2

## 2022-08-06 MED ORDER — ALBUTEROL SULFATE (2.5 MG/3ML) 0.083% IN NEBU: 2.5000 mg | INHALATION_SOLUTION | Freq: Four times a day (QID) | RESPIRATORY_TRACT | Status: DC | PRN

## 2022-08-06 MED ORDER — MIRTAZAPINE 15 MG PO TABS
7.5000 mg | ORAL_TABLET | Freq: Every day | ORAL | Status: DC
  Administered 2022-08-07 – 2022-08-10 (×4): 7.5 mg via ORAL
  Filled 2022-08-06 (×4): qty 1

## 2022-08-06 MED ORDER — ALBUTEROL SULFATE HFA 108 (90 BASE) MCG/ACT IN AERS: 2.0000 | INHALATION_SPRAY | RESPIRATORY_TRACT | Status: DC | PRN

## 2022-08-06 MED ORDER — FERROUS SULFATE 325 (65 FE) MG PO TABS
325.0000 mg | ORAL_TABLET | Freq: Every day | ORAL | Status: DC
  Administered 2022-08-07 – 2022-08-10 (×4): 325 mg via ORAL
  Filled 2022-08-06 (×4): qty 1

## 2022-08-06 MED ORDER — SODIUM CHLORIDE 3 % IV SOLN
INTRAVENOUS | Status: DC
  Filled 2022-08-06 (×4): qty 500

## 2022-08-06 MED ORDER — SODIUM CHLORIDE 0.9 % IV BOLUS
1000.0000 mL | Freq: Once | INTRAVENOUS | Status: AC
  Administered 2022-08-06: 1000 mL via INTRAVENOUS

## 2022-08-06 MED ORDER — MAGNESIUM OXIDE 400 MG PO TABS
600.0000 mg | ORAL_TABLET | Freq: Two times a day (BID) | ORAL | Status: DC
  Administered 2022-08-07 – 2022-08-11 (×9): 600 mg via ORAL
  Filled 2022-08-06: qty 2
  Filled 2022-08-06: qty 1.5
  Filled 2022-08-06 (×3): qty 2
  Filled 2022-08-06 (×5): qty 1.5
  Filled 2022-08-06: qty 2
  Filled 2022-08-06 (×3): qty 1.5
  Filled 2022-08-06 (×3): qty 2

## 2022-08-06 MED ORDER — ONDANSETRON HCL 4 MG/2ML IJ SOLN: 4.0000 mg | Freq: Four times a day (QID) | INTRAMUSCULAR | Status: DC | PRN

## 2022-08-06 MED ORDER — GABAPENTIN 100 MG PO CAPS
100.0000 mg | ORAL_CAPSULE | Freq: Every day | ORAL | Status: DC
  Administered 2022-08-07 – 2022-08-10 (×4): 100 mg via ORAL
  Filled 2022-08-06 (×4): qty 1

## 2022-08-06 MED ORDER — NICOTINE 21 MG/24HR TD PT24: 21.0000 mg | MEDICATED_PATCH | Freq: Every day | TRANSDERMAL | Status: DC

## 2022-08-06 MED ORDER — UMECLIDINIUM BROMIDE 62.5 MCG/ACT IN AEPB
1.0000 | INHALATION_SPRAY | Freq: Every day | RESPIRATORY_TRACT | Status: DC
  Administered 2022-08-07 – 2022-08-11 (×5): 1 via RESPIRATORY_TRACT
  Filled 2022-08-06 (×2): qty 7

## 2022-08-06 MED ORDER — FLUTICASONE FUROATE-VILANTEROL 200-25 MCG/ACT IN AEPB
1.0000 | INHALATION_SPRAY | Freq: Every day | RESPIRATORY_TRACT | Status: DC
  Administered 2022-08-07 – 2022-08-11 (×5): 1 via RESPIRATORY_TRACT
  Filled 2022-08-06 (×2): qty 28

## 2022-08-06 MED ORDER — ETHAMBUTOL HCL 400 MG PO TABS
1200.0000 mg | ORAL_TABLET | Freq: Every day | ORAL | Status: DC
  Administered 2022-08-07 – 2022-08-11 (×5): 1200 mg via ORAL
  Filled 2022-08-06 (×5): qty 3

## 2022-08-06 MED ORDER — LEVOTHYROXINE SODIUM 50 MCG PO TABS
150.0000 ug | ORAL_TABLET | Freq: Every day | ORAL | Status: DC
  Administered 2022-08-07 – 2022-08-11 (×5): 150 ug via ORAL
  Filled 2022-08-06 (×5): qty 1

## 2022-08-06 MED ORDER — BUDESON-GLYCOPYRROL-FORMOTEROL 160-9-4.8 MCG/ACT IN AERO: 2.0000 | INHALATION_SPRAY | Freq: Two times a day (BID) | RESPIRATORY_TRACT | Status: DC

## 2022-08-06 NOTE — Progress Notes (Signed)
[place] NOTE  Pharmacy Consult for hypertonic saline Indication: hyponatremia  Allergies  Allergen Reactions   Lisinopril Rash   Varenicline Rash    Patient Measurements: Height: 6' (182.9 cm) Weight: 78.9 kg (174 lb) IBW/kg (Calculated) : 77.6  Vital Signs: Temp: 97.7 F (36.5 C) (10/06 2049) Temp Source: Oral (10/06 2049) BP: 118/73 (10/06 2126) Pulse Rate: 77 (10/06 2126) Intake/Output from previous day: No intake/output data recorded. Intake/Output from this shift: No intake/output data recorded.  Labs: Recent Labs    08/05/22 1030 08/05/22 1155 08/06/22 2100  WBC 7.0  --  5.3  HGB 13.0  --  12.4*  HCT 35.0*  --  34.5*  PLT 326  --  258  CREATININE 0.46* 0.31* 0.52*  MG  --   --  1.7  ALBUMIN 3.7 3.7 3.7  PROT 6.4* 6.6 6.4*  AST 14* 16 18  ALT 10 10 10   ALKPHOS 95 93 92  BILITOT 0.6 0.4 0.5   Estimated Creatinine Clearance: 101 mL/min (A) (by C-G formula based on SCr of 0.52 mg/dL (L)).   Microbiology: No results found for this or any previous visit (from the past 720 hour(s)).  Medical History: Past Medical History:  Diagnosis Date   Anginal pain ([place])    Anxiety    Asthma    Chest pain    CHF (congestive heart failure) (HCC)    Chicken pox    Complication of anesthesia    o2 dropped after neck fusion   COPD (chronic obstructive pulmonary disease) (HCC)    Coronary artery disease    Cough    chronic  clear phlegm   Dysrhythmia    palpitations   GERD (gastroesophageal reflux disease)    h/o reflux/ hoarsness   Hematochezia    Hemorrhoids    History of chickenpox    History of colon polyps    History of Helicobacter pylori infection    Hoarseness    Hypertension    Lung cancer ([place]) 05/2016   Chemo + rad tx's.    Migraines    OSA (obstructive sleep apnea)    has CPAP but does not use   Personal history of tobacco use, presenting hazards to health 03/05/2016   Pneumonia    5/17   Raynaud disease    Raynaud  disease    Raynaud's disease    Rotator cuff tear    on right   Shortness of breath dyspnea    Sleep apnea    Ulcer (traumatic) of oral mucosa    Assessment: Baseline Na+: 10/6 2116 Na+ = 115  Goal of Therapy:  Increase Na by 4-6 mEq/L in 4-6 hours  Plan:  Hypertonic saline started at 35 ml/hr  Monitor Na q2h x 2 occurrences, then q4h  Call RN to stop infusion and notify MD if: Na increases by 4 mEq/L or more in the first 2 hrs or Na increases by 6 mEq/L or more in the first 4 hrs Max 10-12 mEq/L (8-10 mEq/L chronic) in 24 hrs  Notify MD for rate changes as appropriate  Renda Rolls, PharmD, MBA 08/07/2022 12:12 AM

## 2022-08-06 NOTE — H&P (Addendum)
History and Physical    Patient: Paul Carpenter:[id number] DOB: 12-31-1955 DOA: 08/06/2022 DOS: the patient was seen and examined on 08/06/2022 PCP: [name], MD  Patient coming from: Home  Chief Complaint:  Chief Complaint  Patient presents with   Weakness   HPI: Paul Carpenter is a 66 y.o. male with medical history significant of chronic hyponatremia, lung cancer, coronary artery disease, COPD, ongoing tobacco abuse, essential hypertension, history of radiation with chemotherapy who was being followed by Dr. Candiss Norse in the outpatient setting for the hyponatremia.  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  Patient has been on sodium chloride salt.  For the last week or so he has been nauseated and not eating much.  Has been drinking a lot of Gatorade and tea.  Patient's sodium dropped to 115.  He was seen in the office yesterday with recommendations for him to come to the ER but did not.  Dr. Candiss Norse therefore recommended the patient should go up on his sodium chloride salt.  He was taking 1 g 3 times a day but was asked to take up to 8 g a day.  Despite that sodium remained 115 today.  He was feeling worse so patient came to the ER for further evaluation and treatment.  He has consulted nephrology with recommendation for patient to be admitted to stepdown unit.  Plan is to initiate 3% saline at 30 cc an hour.  Patient is weak.  He has some cough.  He is however mainly nauseated with some episodes of vomiting yesterday.  Today however mainly gagging.  No vomiting in the ER.  Review of Systems: As mentioned in the history of present illness. All other systems reviewed and are negative. Past Medical History:  Diagnosis Date   Anginal pain ([place])    Anxiety    Asthma    Chest pain    CHF (congestive heart failure) (HCC)    Chicken pox    Complication of anesthesia    o2 dropped after neck fusion   COPD (chronic obstructive pulmonary disease) (HCC)    Coronary artery disease    Cough     chronic  clear phlegm   Dysrhythmia    palpitations   GERD (gastroesophageal reflux disease)    h/o reflux/ hoarsness   Hematochezia    Hemorrhoids    History of chickenpox    History of colon polyps    History of Helicobacter pylori infection    Hoarseness    Hypertension    Lung cancer ([place]) 05/2016   Chemo + rad tx's.    Migraines    OSA (obstructive sleep apnea)    has CPAP but does not use   Personal history of tobacco use, presenting hazards to health 03/05/2016   Pneumonia    5/17   Raynaud disease    Raynaud disease    Raynaud's disease    Rotator cuff tear    on right   Shortness of breath dyspnea    Sleep apnea    Ulcer (traumatic) of oral mucosa    Past Surgical History:  Procedure Laterality Date   BACK SURGERY     cervical fusion x 2   CARDIAC CATHETERIZATION     CERVICAL DISCECTOMY     x 2   COLONOSCOPY     COLONOSCOPY N/A 07/25/2015   Procedure: COLONOSCOPY;  Surgeon: Lollie Sails, MD;  Location: [facility] ENDOSCOPY;  Service: Endoscopy;  Laterality: N/A;   COLONOSCOPY WITH PROPOFOL N/A 10/04/2017  Procedure: COLONOSCOPY WITH PROPOFOL;  Surgeon: Lollie Sails, MD;  Location: [facility] ENDOSCOPY;  Service: Endoscopy;  Laterality: N/A;   COLONOSCOPY WITH PROPOFOL N/A 07/10/2020   Procedure: COLONOSCOPY WITH PROPOFOL;  Surgeon: Toledo, Benay Pike, MD;  Location: [facility] ENDOSCOPY;  Service: Gastroenterology;  Laterality: N/A;   ELECTROMAGNETIC NAVIGATION BROCHOSCOPY Left 06/28/2016   Procedure: ELECTROMAGNETIC NAVIGATION BRONCHOSCOPY;  Surgeon: Vilinda Boehringer, MD;  Location: [facility] ORS;  Service: Cardiopulmonary;  Laterality: Left;   ENDOBRONCHIAL ULTRASOUND N/A 04/11/2018   Procedure: ENDOBRONCHIAL ULTRASOUND;  Surgeon: [name], MD;  Location: [facility] ORS;  Service: Cardiopulmonary;  Laterality: N/A;   ESOPHAGOGASTRODUODENOSCOPY N/A 07/25/2015   Procedure: ESOPHAGOGASTRODUODENOSCOPY (EGD);  Surgeon: Lollie Sails, MD;  Location[facility] ENDOSCOPY;  Service:  Endoscopy;  Laterality: N/A;   ESOPHAGOGASTRODUODENOSCOPY (EGD) WITH PROPOFOL N/A 07/10/2020   Procedure: ESOPHAGOGASTRODUODENOSCOPY (EGD) WITH PROPOFOL;  Surgeon: Toledo, Benay Pike, MD;  Location: [facility] ENDOSCOPY;  Service: Gastroenterology;  Laterality: N/A;   NASAL SINUS SURGERY     x 2    PORTA CATH INSERTION N/A 04/24/2018   Procedure: PORTA CATH INSERTION;  Surgeon: Algernon Huxley, MD;  Location: [facility] CV LAB;  Service: Cardiovascular;  Laterality: N/A;   rotator cuff surgery Right    07/2016   SEPTOPLASTY     SKIN GRAFT     Social History:  reports that he has been smoking cigarettes. He has a 150.00 pack-year smoking history. He has never used smokeless tobacco. He reports current alcohol use of about 2.0 standard drinks of alcohol per week. He reports that he does not use drugs.  Allergies  Allergen Reactions   Lisinopril Rash   Varenicline Rash    Family History  Problem Relation Age of Onset   Heart disease Father    Prostate cancer Father    Heart disease Paternal Grandmother    Heart attack Maternal Grandfather 4   Kidney cancer Neg Hx    Bladder Cancer Neg Hx    Other Neg Hx        pituitary abnormality    Prior to Admission medications   Medication Sig Start Date End Date Taking? Authorizing Provider  acetaminophen (TYLENOL) 500 MG tablet Take 500 mg by mouth daily as needed.   Yes [provider]  atorvastatin (LIPITOR) 40 MG tablet Take 40 mg by mouth at bedtime.  03/30/13  Yes [provider]  azithromycin (ZITHROMAX) 500 MG tablet Take 500 mg by mouth daily.   Yes [provider]  Budeson-Glycopyrrol-Formoterol (BREZTRI AEROSPHERE) 160-9-4.8 MCG/ACT AERO Inhale 2 puffs into the lungs in the morning and at bedtime. 04/20/22  Yes Tyler Pita, MD  Budeson-Glycopyrrol-Formoterol (BREZTRI AEROSPHERE) 160-9-4.8 MCG/ACT AERO Inhale 2 puffs into the lungs in the morning and at bedtime. 06/29/22  Yes Tyler Pita, MD  diazepam  (VALIUM) 5 MG tablet Take 5 mg by mouth at bedtime as needed. 11/30/21  Yes [provider]  ethambutol (MYAMBUTOL) 400 MG tablet Take 3 tablets (1,200 mg total) by mouth daily. 05/06/22  Yes Tsosie Billing, MD  ferrous sulfate 325 (65 FE) MG tablet Take 325 mg by mouth at bedtime.   Yes [provider]  levothyroxine (SYNTHROID) 150 MCG tablet Take 150 mcg by mouth daily before breakfast.   Yes [provider]  magnesium oxide (MAG-OX) 400 MG tablet Take 600 mg by mouth 2 (two) times daily. Takes 1.5 tablet twice daily   Yes [provider]  mirtazapine (REMERON) 7.5 MG tablet Take 1 tablet (7.5 mg  total) by mouth at bedtime. 07/14/22  Yes Borders, Kirt Boys, NP  nitroGLYCERIN (NITROSTAT) 0.4 MG SL tablet Place under the tongue. 04/07/21  Yes [provider]  pantoprazole (PROTONIX) 40 MG tablet Take 1 tablet (40 mg total) by mouth 2 (two) times daily before meals 02/15/22  Yes [provider]  rifampin (RIFADIN) 300 MG capsule Take 2 capsules (600 mg total) by mouth daily. 07/20/22  Yes Tsosie Billing, MD  sodium chloride 1 g tablet Take 1 tablet (1 g total) by mouth 3 (three) times daily with meals. 09/22/21  Yes Annita Brod, MD  traMADol (ULTRAM) 50 MG tablet Take 50 mg by mouth every 8 (eight) hours as needed. 02/15/22  Yes [provider]  albuterol (PROAIR HFA) 108 (90 Base) MCG/ACT inhaler Inhale 2 puffs into the lungs every 4 (four) hours as needed for wheezing or shortness of breath. 02/09/22   Tyler Pita, MD  albuterol (PROVENTIL) (2.5 MG/3ML) 0.083% nebulizer solution Take 3 mLs (2.5 mg total) by nebulization 4 (four) times daily as needed for wheezing or shortness of breath. 02/09/22 02/09/23  Tyler Pita, MD  dronabinol (MARINOL) 5 MG capsule Take 1 capsule (5 mg total) by mouth 2 (two) times daily before lunch and supper. Patient not taking: Reported on 08/06/2022 08/05/22   [name], MD   gabapentin (NEURONTIN) 100 MG capsule Take 1 capsule (100 mg total) by mouth at bedtime. 07/19/22   Borders, Kirt Boys, NP  lidocaine-prilocaine (EMLA) cream Apply 1 application topically as needed. 09/30/21   [name], MD  loratadine (CLARITIN) 10 MG tablet Take 10 mg by mouth daily. Patient not taking: Reported on 08/05/2022    [provider]  meloxicam (MOBIC) 15 MG tablet Take 1 tablet (15 mg total) by mouth daily. Take with largest meal of the day. Patient not taking: Reported on 08/05/2022 06/29/22 09/27/22  Tyler Pita, MD  OXYGEN Inhale 2 L into the lungs at bedtime.    [provider]  tadalafil (CIALIS) 5 MG tablet Take 1 tablet (5 mg total) by mouth daily as needed for erectile dysfunction. 10/15/21   McGowan, Larene Beach A, PA-C  ipratropium-albuterol (DUONEB) 0.5-2.5 (3) MG/3ML SOLN Take 3 mLs by nebulization every 6 (six) hours as needed. 01/22/22 02/09/22  Borders, Kirt Boys, NP  levalbuterol (XOPENEX HFA) 45 MCG/ACT inhaler Inhale 1 puff into the lungs every 6 (six) hours as needed for wheezing. 11/19/21 02/09/22  [name], MD    Physical Exam: Vitals:   08/06/22 2215 08/06/22 2230 08/06/22 2245 08/06/22 2250  BP:  134/75  134/75  Pulse: 79  72 71  Resp:    18  Temp:    98 F (36.7 C)  TempSrc:    Oral  SpO2: 97% 94% 98% 96%  Weight:      Height:       Constitutional: Acutely ill looking, NAD, calm, comfortable Eyes: PERRL, lids and conjunctivae normal ENMT: Mucous membranes are dry. Posterior pharynx clear of any exudate or lesions.Normal dentition.  Neck: normal, supple, no masses, no thyromegaly Respiratory: clear to auscultation bilaterally, no wheezing, no crackles. Normal respiratory effort. No accessory muscle use.  Cardiovascular: Regular rate and rhythm, no murmurs / rubs / gallops. No extremity edema. 2+ pedal pulses. No carotid bruits.  Abdomen: no tenderness, no masses palpated. No hepatosplenomegaly. Bowel sounds positive.   Musculoskeletal: Good range of motion, no joint swelling or tenderness, Skin: no rashes, lesions, ulcers. No induration Neurologic: CN 2-12  grossly intact. Sensation intact, DTR normal. Strength 5/5 in all 4.  Psychiatric: Normal judgment and insight. Alert and oriented x 3. Normal mood  Data Reviewed:  Sodium is 115, Chloride 82, glucose 116, BUN 5, creatinine 0.52, white count 5.3 and hemoglobin 12.4,Chest x-ray shows stable right upper lobe cavitary lesion with stable bilateral pulmonary nodules.  Assessment and Plan:  #1 symptomatic hyponatremia: History of known SIADH.  With a sodium down to 115.  Patient will be admitted to stepdown unit.  Follow nephrology recommendations.  Initial 3% saline with serial BMPs.  Monitor rate of sodium correction to avoid overcorrection.  Nephrology likely to see in the morning.  Free water restriction  #2 COPD: No acute exacerbation.  Continue with supportive care  #3 persistent nausea: Occasional vomiting.  May be related to lung cancer.  No other source is identified at the moment.  Symptomatic treatment and IV fluids as needed.  #4 tobacco abuse: Nicotine patch added.  Counseling provided  #5 non-small cell lung cancer: Being followed by Dr. Grayland Ormond.  Continue outpatient follow-up.  #6 hyperlipidemia: Resume statin  #7 coronary artery disease: Stable at baseline.  Continue to monitor  #8 essential hypertension: Continue home regimen.  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  Continue  #11 hypothyroidism: Continue levothyroxine at 150 mcg daily  #12 GERD: Continue with PPIs     Advance Care Planning:   Code Status: Prior full code  Consults: Dr. Candiss Norse, nephrology  Family Communication: Wife and daughter at bedside  Severity of Illness: The appropriate patient status for this patient is INPATIENT.  Inpatient status is judged to be reasonable and necessary in order to provide the required intensity of service to ensure the patient's safety. The patient's presenting symptoms, physical exam findings, and initial radiographic and laboratory data in the context of their chronic comorbidities is felt to place them at high risk for further clinical deterioration. Furthermore, it is not anticipated that the patient will be medically stable for discharge from the hospital within 2 midnights of admission.   * I certify that at the point of admission it is my clinical judgment that the patient will require inpatient hospital care spanning beyond 2 midnights from the point of admission due to high intensity of service, high risk for further deterioration and high frequency of surveillance required.*  AuthorBarbette Merino, MD 08/06/2022 11:01 PM  For on call review [email or web address].

## 2022-08-06 NOTE — ED Notes (Signed)
Pt. Has lung CA, is not currently undergoing treatment. Pt. Is on abx for MAC infection.

## 2022-08-06 NOTE — ED Notes (Signed)
Date and time results received: 08/06/22 2145 (use smartphrase ".now" to insert current time)  Test: sodium  Critical Value: 115  Name of Provider Notified: stafford at 2145  Orders Received? Or Actions Taken?:

## 2022-08-06 NOTE — ED Triage Notes (Signed)
Pt sent here by MD for low sodium levels. Pt endorses weakness, fatigue, and CP. Per pt, he has had involuntary twitching on his extremities.

## 2022-08-06 NOTE — ED Provider Notes (Signed)
[facility] Provider Note    Event Date/Time   First MD Initiated Contact with Patient 08/06/22 2135     (approximate)   History   Chief Complaint: Weakness   HPI  Paul Carpenter is a 66 y.o. male with a history of COPD, chronic hyponatremia, SIADH, pulmonary MAC, lung cancer, GERD, CHF who was sent to the ED due to generalized weakness and outpatient labs showing a sodium of 115.  Patient's had progressively worsening fatigue for the last several days, along with developing dysarthria and involuntary muscle twitching.  No trauma, no fever vomiting or diarrhea.     Physical Exam   Triage Vital Signs: ED Triage Vitals  Enc Vitals Group     BP 08/06/22 2049 137/75     Pulse Rate 08/06/22 2049 81     Resp 08/06/22 2049 18     Temp 08/06/22 2049 97.7 F (36.5 C)     Temp Source 08/06/22 2049 Oral     SpO2 08/06/22 2049 98 %     Weight 08/06/22 2049 174 lb (78.9 kg)     Height 08/06/22 2049 6' (1.829 m)     Head Circumference --      Peak Flow --      Pain Score 08/06/22 2053 8     Pain Loc --      Pain Edu? --      Excl. in [place]? --     Most recent vital signs: Vitals:   08/06/22 2049 08/06/22 2126  BP: 137/75 118/73  Pulse: 81 77  Resp: 18   Temp: 97.7 F (36.5 C)   SpO2: 98% 96%    General: Awake, no distress.  CV:  Good peripheral perfusion.  Regular rate and rhythm Resp:  Normal effort.  Clear to auscultation Abd:  No distention.  Soft nontender Other:  Dry mucous membranes.  Neuro exam nonfocal   ED Results / Procedures / Treatments   Labs (all labs ordered are listed, but only abnormal results are displayed) Labs Reviewed  CBC WITH DIFFERENTIAL/PLATELET - Abnormal; Notable for the following components:      Result Value   RBC 3.88 (*)    Hemoglobin 12.4 (*)    HCT 34.5 (*)    Lymphs Abs 0.4 (*)    All other components within normal limits  COMPREHENSIVE METABOLIC PANEL - Abnormal; Notable for the following components:    Sodium 115 (*)    Chloride 82 (*)    Glucose, Bld 116 (*)    BUN 5 (*)    Creatinine, Ser 0.52 (*)    Calcium 8.4 (*)    Total Protein 6.4 (*)    All other components within normal limits  MAGNESIUM  SODIUM  SODIUM  SODIUM  SODIUM  OSMOLALITY  OSMOLALITY, URINE  SODIUM, URINE, RANDOM  TROPONIN I (HIGH SENSITIVITY)     EKG Interpreted by me Normal sinus rhythm rate of 88.  Normal axis, normal intervals.  Poor R wave progression.  Normal ST segments and T waves.  No ischemic changes.   RADIOLOGY Chest x-ray interpreted by me, shows right upper lobe lesion.  Radiology report reviewed noting this is stable from before   PROCEDURES:  .Critical Care  Performed by: Carrie Mew, MD Authorized by: Carrie Mew, MD   Critical care provider statement:    Critical care time (minutes):  35   Critical care time was exclusive of:  Separately billable procedures and treating other patients  Critical care was necessary to treat or prevent imminent or life-threatening deterioration of the following conditions:  CNS failure or compromise and metabolic crisis   Critical care was time spent personally by me on the following activities:  Development of treatment plan with patient or surrogate, discussions with consultants, evaluation of patient's response to treatment, examination of patient, obtaining history from patient or surrogate, ordering and performing treatments and interventions, ordering and review of laboratory studies, ordering and review of radiographic studies, pulse oximetry, re-evaluation of patient's condition and review of old charts   Care discussed with: admitting provider      [place] ED: Medications  sodium chloride (hypertonic) 3 % solution (has no administration in time range)  sodium chloride 0.9 % bolus 1,000 mL (1,000 mLs Intravenous New Bag/Given 08/06/22 2147)     IMPRESSION / MDM / [place] / ED COURSE  I reviewed  the triage vital signs and the nursing notes.                              Differential diagnosis includes, but is not limited to, pneumonia, pleural effusion, hyponatremia, anemia  Patient's presentation is most consistent with acute presentation with potential threat to life or bodily function.  Patient presents with generalized weakness and dysarthria, labs show sodium level of 115 indicative of acute, critical hyponatremia which is symptomatic.  Discussed with nephrology Dr. Candiss Norse who recommends hypertonic saline 3%, 30 to 35 mL/h, goal of increasing sodium level by 8 over 24 hours.  Discussed with hospitalist for stepdown admission.       FINAL CLINICAL IMPRESSION(S) / ED DIAGNOSES   Final diagnoses:  Acute hyponatremia     Rx / DC Orders   ED Discharge Orders     None        Note:  This document was prepared using Dragon voice recognition software and may include unintentional dictation errors.   Carrie Mew, MD 08/07/22 [phone]

## 2022-08-07 DIAGNOSIS — Z8619 Personal history of other infectious and parasitic diseases: Secondary | ICD-10-CM

## 2022-08-07 DIAGNOSIS — R110 Nausea: Secondary | ICD-10-CM

## 2022-08-07 DIAGNOSIS — E871 Hypo-osmolality and hyponatremia: Secondary | ICD-10-CM | POA: Diagnosis not present

## 2022-08-07 LAB — BASIC METABOLIC PANEL
Anion gap: 8 (ref 5–15)
Anion gap: 9 (ref 5–15)
Anion gap: 5 (ref 5–15)
Anion gap: 3 — ABNORMAL LOW (ref 5–15)
Anion gap: 5 (ref 5–15)
BUN: <5 mg/dL — ABNORMAL LOW (ref 8–23)
BUN: <5 mg/dL — ABNORMAL LOW (ref 8–23)
BUN: <5 mg/dL — ABNORMAL LOW (ref 8–23)
BUN: <5 mg/dL — ABNORMAL LOW (ref 8–23)
BUN: <5 mg/dL — ABNORMAL LOW (ref 8–23)
CO2: 23 mmol/L (ref 22–32)
CO2: 24 mmol/L (ref 22–32)
CO2: 26 mmol/L (ref 22–32)
CO2: 27 mmol/L (ref 22–32)
CO2: 26 mmol/L (ref 22–32)
Calcium: 8.1 mg/dL — ABNORMAL LOW (ref 8.9–10.3)
Calcium: 8.3 mg/dL — ABNORMAL LOW (ref 8.9–10.3)
Calcium: 8.4 mg/dL — ABNORMAL LOW (ref 8.9–10.3)
Calcium: 8.1 mg/dL — ABNORMAL LOW (ref 8.9–10.3)
Calcium: 8.1 mg/dL — ABNORMAL LOW (ref 8.9–10.3)
Chloride: 87 mmol/L — ABNORMAL LOW (ref 98–111)
Chloride: 85 mmol/L — ABNORMAL LOW (ref 98–111)
Chloride: 90 mmol/L — ABNORMAL LOW (ref 98–111)
Chloride: 90 mmol/L — ABNORMAL LOW (ref 98–111)
Chloride: 89 mmol/L — ABNORMAL LOW (ref 98–111)
Creatinine, Ser: 0.44 mg/dL — ABNORMAL LOW (ref 0.61–1.24)
Creatinine, Ser: 0.4 mg/dL — ABNORMAL LOW (ref 0.61–1.24)
Creatinine, Ser: 0.41 mg/dL — ABNORMAL LOW (ref 0.61–1.24)
Creatinine, Ser: 0.43 mg/dL — ABNORMAL LOW (ref 0.61–1.24)
Creatinine, Ser: 0.35 mg/dL — ABNORMAL LOW (ref 0.61–1.24)
GFR, Estimated: >60 mL/min (ref >60)
GFR, Estimated: >60 mL/min (ref >60)
GFR, Estimated: >60 mL/min (ref >60)
GFR, Estimated: >60 mL/min (ref >60)
GFR, Estimated: >60 mL/min (ref >60)
Glucose, Bld: 98 mg/dL (ref 70–99)
Glucose, Bld: 89 mg/dL (ref 70–99)
Glucose, Bld: 93 mg/dL (ref 70–99)
Glucose, Bld: 100 mg/dL — ABNORMAL HIGH (ref 70–99)
Glucose, Bld: 88 mg/dL (ref 70–99)
Potassium: 4.3 mmol/L (ref 3.5–5.1)
Potassium: 4.3 mmol/L (ref 3.5–5.1)
Potassium: 4.5 mmol/L (ref 3.5–5.1)
Potassium: 4 mmol/L (ref 3.5–5.1)
Potassium: 4 mmol/L (ref 3.5–5.1)
Sodium: 118 mmol/L — CL (ref 135–145)
Sodium: 118 mmol/L — CL (ref 135–145)
Sodium: 121 mmol/L — ABNORMAL LOW (ref 135–145)
Sodium: 120 mmol/L — ABNORMAL LOW (ref 135–145)
Sodium: 120 mmol/L — ABNORMAL LOW (ref 135–145)

## 2022-08-07 LAB — MRSA NEXT GEN BY PCR, NASAL: MRSA by PCR Next Gen: NOT DETECTED

## 2022-08-07 LAB — CBC
HCT: 33.8 % — ABNORMAL LOW (ref 39.0–52.0)
Hemoglobin: 12.4 g/dL — ABNORMAL LOW (ref 13.0–17.0)
MCH: 32.6 pg (ref 26.0–34.0)
MCHC: 36.7 g/dL — ABNORMAL HIGH (ref 30.0–36.0)
MCV: 88.9 fL (ref 80.0–100.0)
Platelets: 220 10*3/uL (ref 150–400)
RBC: 3.8 MIL/uL — ABNORMAL LOW (ref 4.22–5.81)
RDW: 11.7 % (ref 11.5–15.5)
WBC: 4.8 10*3/uL (ref 4.0–10.5)
nRBC: 0 % (ref 0.0–0.2)

## 2022-08-07 LAB — SODIUM
Sodium: 122 mmol/L — ABNORMAL LOW (ref 135–145)
Sodium: 120 mmol/L — ABNORMAL LOW (ref 135–145)
Sodium: 120 mmol/L — ABNORMAL LOW (ref 135–145)

## 2022-08-07 MED ORDER — NICOTINE 21 MG/24HR TD PT24
21.0000 mg | MEDICATED_PATCH | Freq: Every day | TRANSDERMAL | Status: DC
  Administered 2022-08-07 – 2022-08-11 (×5): 21 mg via TRANSDERMAL
  Filled 2022-08-07 (×5): qty 1

## 2022-08-07 MED ORDER — CHLORHEXIDINE GLUCONATE CLOTH 2 % EX PADS: 6.0000 | MEDICATED_PAD | Freq: Every day | CUTANEOUS | Status: DC

## 2022-08-07 MED ORDER — ORAL CARE MOUTH RINSE: 15.0000 mL | OROMUCOSAL | Status: DC | PRN

## 2022-08-07 NOTE — Progress Notes (Signed)
[place], [place] 08/07/22  Subjective:   Hospital day # 1  Patient known to our practice from previous admission as well as outpatient follow-up.  He is seen for acute hyponatremia.  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 Last week he has been nauseated and not eating much.  He has been trying to drink clear fluids, ice tea.  Prior to the weekend he was recommended to increase salt intake and restrict clear fluids.  He started feeling worse, having weakness, fatigue and involuntary movements of his extremities.  Therefore he decided to come to the emergency room where his sodium was found to be 117.  He was then placed on 3% hypertonic saline at a lower dose which has resulted in increase of sodium to 120 this morning.  Clinically patient feels better.  Objective:  Vital signs in last 24 hours:  Temp:  [97.7 F (36.5 C)-98 F (36.7 C)] 98 F (36.7 C) (10/07 0400) Pulse Rate:  [66-87] 79 (10/07 0800) Resp:  [18-26] 26 (10/07 0800) BP: (113-145)/(61-90) 122/76 (10/07 0800) SpO2:  [93 %-98 %] 97 % (10/07 0800) Weight:  [78.9 kg] 78.9 kg (10/06 2049)  Weight change:  Filed Weights   08/06/22 2049  Weight: 78.9 kg    Intake/Output:    Intake/Output Summary (Last 24 hours) at 08/07/2022 1122 Last data filed at 08/07/2022 0946 Gross per 24 hour  Intake 1249.6 ml  Output 2600 ml  Net -1350.4 ml    Physical Exam: General:  No acute distress, laying in the bed  HEENT  anicteric, moist oral mucous membrane  Pulm/lungs  normal breathing effort, lungs are clear to auscultation  CVS/Heart  regular rhythm, no rub or gallop  Abdomen:   Soft, nontender  Extremities:  No peripheral edema  Neurologic:  Alert, oriented, able to follow commands  Skin:  No acute rashes     Basic Metabolic Panel:  Recent Labs  Lab 08/06/22 2100 08/06/22 2243 08/07/22 0148 08/07/22 0334 08/07/22 0526 08/07/22 0728  08/07/22 0908  NA 115*   < > 118* 118* 121* 120* 120*  K 4.4  --  4.3 4.3 4.5 4.0 4.0  CL 82*  --  87* 85* 90* 90* 89*  CO2 26  --  _0 GLUCOSE 116*  --  98 89 93 100* 88  BUN 5*  --  <5* <5* <5* <5* <5*  CREATININE 0.52*  --  0.44* 0.40* 0.41* 0.43* 0.35*  CALCIUM 8.4*  --  8.1* 8.3* 8.4* 8.1* 8.1*  MG 1.7  --   --   --   --   --   --    < > = values in this interval not displayed.     CBC: Recent Labs  Lab 08/03/22 0945 08/05/22 1030 08/06/22 2100 08/07/22 0526  WBC 7.1 7.0 5.3 4.8  NEUTROABS 5.9 5.8 4.3  --   HGB 12.9* 13.0 12.4* 12.4*  HCT 36.3* 35.0* 34.5* 33.8*  MCV 91.2 89.1 88.9 88.9  PLT 270 326 258 220     No results found for: "HEPBSAG", "HEPBSAB", "HEPBIGM"    Microbiology:  Recent Results (from the past 240 hour(s))  MRSA Next Gen by PCR, Nasal     Status: None   Collection Time: 08/07/22  1:08 AM   Specimen: Nasal Mucosa; Nasal Swab  Result Value Ref Range Status   MRSA by PCR Next Gen NOT DETECTED NOT DETECTED Final  Comment: (NOTE) The GeneXpert MRSA Assay (FDA approved for NASAL specimens only), is one component of a comprehensive MRSA colonization surveillance program. It is not intended to diagnose MRSA infection nor to guide or monitor treatment for MRSA infections. Test performance is not FDA approved in patients less than 32 years old. Performed at [facility], [address] [place], [place] [address]     Coagulation Studies: No results for input(s): "LABPROT", "INR" in the last 72 hours.  Urinalysis: No results for input(s): "COLORURINE", "LABSPEC", "PHURINE", "GLUCOSEU", "HGBUR", "BILIRUBINUR", "KETONESUR", "PROTEINUR", "UROBILINOGEN", "NITRITE", "LEUKOCYTESUR" in the last 72 hours.  Invalid input(s): "APPERANCEUR"    Imaging: DG Chest Port 1 View  Result Date: 08/06/2022 CLINICAL DATA:  Chest pain with weakness and fatigue. EXAM: PORTABLE CHEST 1 VIEW COMPARISON:  Mar 24, 2022 FINDINGS: There is  stable right-sided venous Port-A-Cath positioning. The heart size and mediastinal contours are within normal limits. There is mild calcification of the aortic arch. A stable cavitary lesion is seen within the right upper lobe with adjacent right apical pleural thickening. A stable right suprahilar opacity is also noted. Stable, ill-defined bilateral pulmonary nodules are seen. There is no evidence of an acute infiltrate, pleural effusion or pneumothorax. Postoperative changes are seen within the lower cervical spine. No acute osseous abnormalities are identified. IMPRESSION: 1. Stable right upper lobe cavitary lesion with stable bilateral pulmonary nodules. Electronically Signed   By: Virgina Norfolk M.D.   On: 08/06/2022 21:22     Medications:    sodium chloride (hypertonic) 35 mL/hr at 08/07/22 0600    atorvastatin  40 mg Oral QHS   azithromycin  500 mg Oral Daily   enoxaparin (LOVENOX) injection  40 mg Subcutaneous Q24H   ethambutol  1,200 mg Oral Daily   ferrous sulfate  325 mg Oral QHS   fluticasone furoate-vilanterol  1 puff Inhalation Daily   And   umeclidinium bromide  1 puff Inhalation Daily   gabapentin  100 mg Oral QHS   levothyroxine  150 mcg Oral Q0600   magnesium oxide  600 mg Oral BID   mirtazapine  7.5 mg Oral QHS   nicotine  21 mg Transdermal Daily   pantoprazole  40 mg Oral Daily   rifampin  600 mg Oral Daily   albuterol, diazepam, morphine injection, nitroGLYCERIN, ondansetron **OR** ondansetron (ZOFRAN) IV, mouth rinse  Assessment/ Plan:  66 y.o. male with hyponatremia from SIADH, COPD, tobacco abuse, non-small cell lung cancer, hyperlipidemia, coronary disease, hypertension, obstructive sleep apnea, history of MAC infection, hypothyroidism, GERD   admitted on 08/06/2022 for Acute hyponatremia [E87.1] Hyponatremia [E87.1]  Critical hyponatremia due to SIADH Continue free water restriction to about 1200 cc/day Continue 3% saline at a low dose Monitor sodium at  least every 4 hours Goal for sodium correction to approximately 128 by tomorrow morning     LOS: [place] 10/7/202311:22 AM  [place], [place]  Note: This note was prepared with Dragon dictation. Any transcription errors are unintentional

## 2022-08-07 NOTE — Progress Notes (Signed)
Patient currently resting in bed; appears to be sleeping, snoring softly. NSR. Wife was at bedside earlier this evening. She states that pt ate about 50% lunch and 75% supper. Reports that zofran was effective. Voided in urinal x 2 this evening. 3% saline infusing left forearm; IV watch in place; good blood return.

## 2022-08-07 NOTE — Progress Notes (Signed)
PROGRESS NOTE    Paul Carpenter  YTK:[id number] DOB: 10/26/1956 DOA: 08/06/2022 PCP: [name], MD   Assessment & Plan:   Principal Problem:   Hyponatremia Active Problems:   Non-small cell lung cancer, right (HCC)   COPD (chronic obstructive pulmonary disease) (HCC)   Obstructive sleep apnea   Benign essential hypertension   Chronic coronary artery disease   Cigarette smoker   Mixed hyperlipidemia   Hypothyroidism, acquired  Assessment and Plan:  Symptomatic hyponatremia: w/ hx of SIADH. Continue on 3% saline. Na level q4h to avoid over correction. Nephro consulted   COPD: w/o exacerbation. Continue on bronchodilators    Persistent nausea: w/ occasional vomiting.  May be related to lung cancer.  No other source is identified at the moment.  Zofran prn    Tobacco abuse: smoking cessation counseling x 5 mins. Nicotine patch to prevent w/drawl    Non-small cell lung cancer: not on treatment for cancer while on treatment for MAC. Continue to f/u outpatient w/ onco, Dr. Grayland Ormond   Hx of CAD: no chest pain. 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   Hypothyroidism: continue on levothyroxine    GERD: continue on PPI       DVT prophylaxis: lovenox  Code Status: full  Family Communication: discussed pt's care w/ pt's wife at bedside and answered her questions Disposition Plan: likely d/c back home   Level of care: Stepdown   Status is: Inpatient Remains inpatient appropriate because: severity of illness   Consultants:  Nephro   Procedures:   Antimicrobials:   Subjective: Pt c/o nausea  Objective: Vitals:   08/07/22 0600 08/07/22 0602 08/07/22 0700 08/07/22 0800  BP: 134/78  113/62 122/76  Pulse: 76 80 66 79  Resp: (!) 26 19 (!) 22 (!) 26  Temp:      TempSrc:      SpO2: 97% 97% 96% 97%  Weight:      Height:         Intake/Output Summary (Last 24 hours) at 08/07/2022 0828 Last data filed at 08/07/2022 0700 Gross per 24 hour  Intake 1249.6 ml  Output 2350 ml  Net -1100.4 ml   Filed Weights   08/06/22 2049  Weight: 78.9 kg    Examination:  General exam: Appears calm and comfortable  Respiratory system: Clear to auscultation. Respiratory effort normal. Cardiovascular system: S1 & S2 +. No rubs, gallops or clicks.  Gastrointestinal system: Abdomen is nondistended, soft and nontender.  Normal bowel sounds heard. Central nervous system: Alert and oriented. Moves all extremities  Psychiatry: Judgement and insight appear normal. Flat mood and affect     Data Reviewed: I have personally reviewed following labs and imaging studies  CBC: Recent Labs  Lab 08/03/22 0945 08/05/22 1030 08/06/22 2100 08/07/22 0526  WBC 7.1 7.0 5.3 4.8  NEUTROABS 5.9 5.8 4.3  --   HGB 12.9* 13.0 12.4* 12.4*  HCT 36.3* 35.0* 34.5* 33.8*  MCV 91.2 89.1 88.9 88.9  PLT 270 326 258 751   Basic Metabolic Panel: Recent Labs  Lab 08/06/22 2100 08/06/22 2243 08/07/22 0148 08/07/22 0334 08/07/22 0526 08/07/22 0728  NA 115* 117* 118* 118* 121* 120*  K 4.4  --  4.3 4.3 4.5 4.0  CL 82*  --  87* 85* 90* 90*  CO2 26  --  _0 GLUCOSE  116*  --  98 89 93 100*  BUN 5*  --  <5* <5* <5* <5*  CREATININE 0.52*  --  0.44* 0.40* 0.41* 0.43*  CALCIUM 8.4*  --  8.1* 8.3* 8.4* 8.1*  MG 1.7  --   --   --   --   --    GFR: Estimated Creatinine Clearance: 101 mL/min (A) (by C-G formula based on SCr of 0.43 mg/dL (L)). Liver Function Tests: Recent Labs  Lab 08/03/22 0945 08/05/22 1030 08/05/22 1155 08/06/22 2100  AST 16 14* 16 18  ALT _0 ALKPHOS 98 95 93 92  BILITOT 0.5 0.6 0.4 0.5  PROT 6.6 6.4* 6.6 6.4*  ALBUMIN 3.7 3.7 3.7 3.7   No results for input(s): "LIPASE", "AMYLASE" in the last 168 hours. No results for input(s): "AMMONIA" in the last 168 hours. Coagulation Profile: No results for  input(s): "INR", "PROTIME" in the last 168 hours. Cardiac Enzymes: No results for input(s): "CKTOTAL", "CKMB", "CKMBINDEX", "TROPONINI" in the last 168 hours. BNP (last 3 results) No results for input(s): "PROBNP" in the last 8760 hours. HbA1C: No results for input(s): "HGBA1C" in the last 72 hours. CBG: Recent Labs  Lab 08/06/22 2320  GLUCAP 91   Lipid Profile: No results for input(s): "CHOL", "HDL", "LDLCALC", "TRIG", "CHOLHDL", "LDLDIRECT" in the last 72 hours. Thyroid Function Tests: No results for input(s): "TSH", "T4TOTAL", "FREET4", "T3FREE", "THYROIDAB" in the last 72 hours. Anemia Panel: No results for input(s): "VITAMINB12", "FOLATE", "FERRITIN", "TIBC", "IRON", "RETICCTPCT" in the last 72 hours. Sepsis Labs: No results for input(s): "PROCALCITON", "LATICACIDVEN" in the last 168 hours.  Recent Results (from the past 240 hour(s))  MRSA Next Gen by PCR, Nasal     Status: None   Collection Time: 08/07/22  1:08 AM   Specimen: Nasal Mucosa; Nasal Swab  Result Value Ref Range Status   MRSA by PCR Next Gen NOT DETECTED NOT DETECTED Final    Comment: (NOTE) The GeneXpert MRSA Assay (FDA approved for NASAL specimens only), is one component of a comprehensive MRSA colonization surveillance program. It is not intended to diagnose MRSA infection nor to guide or monitor treatment for MRSA infections. Test performance is not FDA approved in patients less than 14 years old. Performed at [facility], [address] [place], [place] [address]          Radiology Studies: [facility] Chest [place] 1 View  Result Date: 08/06/2022 CLINICAL DATA:  Chest pain with weakness and fatigue. EXAM: PORTABLE CHEST 1 VIEW COMPARISON:  Mar 24, 2022 FINDINGS: There is stable right-sided venous Port-A-Cath positioning. The heart size and mediastinal contours are within normal limits. There is mild calcification of the aortic arch. A stable cavitary lesion is seen within the right upper lobe with  adjacent right apical pleural thickening. A stable right suprahilar opacity is also noted. Stable, ill-defined bilateral pulmonary nodules are seen. There is no evidence of an acute infiltrate, pleural effusion or pneumothorax. Postoperative changes are seen within the lower cervical spine. No acute osseous abnormalities are identified. IMPRESSION: 1. Stable right upper lobe cavitary lesion with stable bilateral pulmonary nodules. Electronically Signed   By: Virgina Norfolk M.D.   On: 08/06/2022 21:22        Scheduled Meds:  atorvastatin  40 mg Oral QHS   azithromycin  500 mg Oral Daily   enoxaparin (LOVENOX) injection  40 mg Subcutaneous Q24H   ethambutol  1,200 mg Oral Daily   ferrous sulfate  325  mg Oral QHS   fluticasone furoate-vilanterol  1 puff Inhalation Daily   And   umeclidinium bromide  1 puff Inhalation Daily   gabapentin  100 mg Oral QHS   levothyroxine  150 mcg Oral Q0600   magnesium oxide  600 mg Oral BID   mirtazapine  7.5 mg Oral QHS   nicotine  21 mg Transdermal Daily   pantoprazole  40 mg Oral Daily   rifampin  600 mg Oral Daily   Continuous Infusions:  sodium chloride (hypertonic) 35 mL/hr at 08/07/22 0600     LOS: 1 day    Time spent: 36 mins     Wyvonnia Dusky, MD [facility] Pager [phone]  If 7PM-7AM, please contact night-coverage [email or web address] 08/07/2022, 8:28 AM

## 2022-08-08 DIAGNOSIS — Z8619 Personal history of other infectious and parasitic diseases: Secondary | ICD-10-CM | POA: Diagnosis not present

## 2022-08-08 DIAGNOSIS — E871 Hypo-osmolality and hyponatremia: Secondary | ICD-10-CM | POA: Diagnosis not present

## 2022-08-08 DIAGNOSIS — R110 Nausea: Secondary | ICD-10-CM | POA: Diagnosis not present

## 2022-08-08 LAB — CBC
HCT: 35.2 % — ABNORMAL LOW (ref 39.0–52.0)
Hemoglobin: 12.8 g/dL — ABNORMAL LOW (ref 13.0–17.0)
MCH: 33 pg (ref 26.0–34.0)
MCHC: 36.4 g/dL — ABNORMAL HIGH (ref 30.0–36.0)
MCV: 90.7 fL (ref 80.0–100.0)
Platelets: 221 10*3/uL (ref 150–400)
RBC: 3.88 MIL/uL — ABNORMAL LOW (ref 4.22–5.81)
RDW: 11.9 % (ref 11.5–15.5)
WBC: 5.1 10*3/uL (ref 4.0–10.5)
nRBC: 0 % (ref 0.0–0.2)

## 2022-08-08 LAB — BASIC METABOLIC PANEL
Anion gap: 7 (ref 5–15)
BUN: <5 mg/dL — ABNORMAL LOW (ref 8–23)
CO2: 23 mmol/L (ref 22–32)
Calcium: 8.9 mg/dL (ref 8.9–10.3)
Chloride: 91 mmol/L — ABNORMAL LOW (ref 98–111)
Creatinine, Ser: 0.5 mg/dL — ABNORMAL LOW (ref 0.61–1.24)
GFR, Estimated: >60 mL/min (ref >60)
Glucose, Bld: 79 mg/dL (ref 70–99)
Potassium: 4.9 mmol/L (ref 3.5–5.1)
Sodium: 121 mmol/L — ABNORMAL LOW (ref 135–145)

## 2022-08-08 LAB — SODIUM
Sodium: 122 mmol/L — ABNORMAL LOW (ref 135–145)
Sodium: 119 mmol/L — CL (ref 135–145)
Sodium: 123 mmol/L — ABNORMAL LOW (ref 135–145)
Sodium: 126 mmol/L — ABNORMAL LOW (ref 135–145)
Sodium: 126 mmol/L — ABNORMAL LOW (ref 135–145)

## 2022-08-08 LAB — HEPATIC FUNCTION PANEL
ALT: 10 U/L (ref 0–44)
AST: 17 U/L (ref 15–41)
Albumin: 3.4 g/dL — ABNORMAL LOW (ref 3.5–5.0)
Alkaline Phosphatase: 98 U/L (ref 38–126)
Bilirubin, Direct: <0.1 mg/dL (ref 0.0–0.2)
Total Bilirubin: 0.3 mg/dL (ref 0.3–1.2)
Total Protein: 6.1 g/dL — ABNORMAL LOW (ref 6.5–8.1)

## 2022-08-08 LAB — GLUCOSE, CAPILLARY: Glucose-Capillary: 133 mg/dL — ABNORMAL HIGH (ref 70–99)

## 2022-08-08 MED ORDER — CHLORHEXIDINE GLUCONATE CLOTH 2 % EX PADS
6.0000 | MEDICATED_PAD | Freq: Every day | CUTANEOUS | Status: DC
  Administered 2022-08-09 – 2022-08-10 (×2): 6 via TOPICAL

## 2022-08-08 MED ORDER — TOLVAPTAN 15 MG PO TABS
15.0000 mg | ORAL_TABLET | Freq: Once | ORAL | Status: AC
  Administered 2022-08-08: 15 mg via ORAL
  Filled 2022-08-08: qty 1

## 2022-08-08 NOTE — Progress Notes (Signed)
[place], [place] 08/08/22  Subjective:   Hospital day # 2  Patient known to our practice from previous admission as well as outpatient follow-up.  He is seen for acute hyponatremia.  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 No nausea. Albe to eat some Na still low 120's despite initial improvement with 3% hypertonic saline  Objective:  Vital signs in last 24 hours:  Temp:  [97.6 F (36.4 C)-98.9 F (37.2 C)] 97.6 F (36.4 C) (10/08 0000) Pulse Rate:  [72-93] 73 (10/08 0400) Resp:  [15-21] 21 (10/08 0400) BP: (105-132)/(59-102) 119/70 (10/08 0400) SpO2:  [93 %-96 %] 94 % (10/08 0400)  Weight change:  Filed Weights   08/06/22 2049  Weight: 78.9 kg    Intake/Output:    Intake/Output Summary (Last 24 hours) at 08/08/2022 0908 Last data filed at 08/08/2022 0800 Gross per 24 hour  Intake 1095.77 ml  Output 2325 ml  Net -1229.23 ml     Physical Exam: General:  No acute distress, laying in the bed  HEENT  anicteric, moist oral mucous membrane  Pulm/lungs  normal breathing effort, lungs are clear to auscultation  CVS/Heart  regular rhythm, no rub or gallop  Abdomen:   Soft, nontender  Extremities:  No peripheral edema  Neurologic:  Alert, oriented, able to follow commands  Skin:  No acute rashes     Basic Metabolic Panel:  Recent Labs  Lab 08/06/22 2100 08/06/22 2243 08/07/22 0334 08/07/22 0526 08/07/22 0728 08/07/22 0908 08/07/22 1328 08/07/22 1703 08/07/22 2146 08/08/22 0137 08/08/22 0541  NA 115*   < > 118* 121* 120* 120* 122* 120* 120* 122* 121*  K 4.4   < > 4.3 4.5 4.0 4.0  --   --   --   --  4.9  CL 82*   < > 85* 90* 90* 89*  --   --   --   --  91*  CO2 26   < > _0 --   --   --   --  23  GLUCOSE 116*   < > 89 93 100* 88  --   --   --   --  79  BUN 5*   < > <5* <5* <5* <5*  --   --   --   --  <5*  CREATININE 0.52*   < > 0.40* 0.41* 0.43* 0.35*  --   --   --   --   0.50*  CALCIUM 8.4*   < > 8.3* 8.4* 8.1* 8.1*  --   --   --   --  8.9  MG 1.7  --   --   --   --   --   --   --   --   --   --    < > = values in this interval not displayed.      CBC: Recent Labs  Lab 08/03/22 0945 08/05/22 1030 08/06/22 2100 08/07/22 0526 08/08/22 0541  WBC 7.1 7.0 5.3 4.8 5.1  NEUTROABS 5.9 5.8 4.3  --   --   HGB 12.9* 13.0 12.4* 12.4* 12.8*  HCT 36.3* 35.0* 34.5* 33.8* 35.2*  MCV 91.2 89.1 88.9 88.9 90.7  PLT 270 326 258 220 221      No results found for: "HEPBSAG", "HEPBSAB", "HEPBIGM"    Microbiology:  Recent Results (from the past 240 hour(s))  MRSA Next Gen by PCR, Nasal  Status: None   Collection Time: 08/07/22  1:08 AM   Specimen: Nasal Mucosa; Nasal Swab  Result Value Ref Range Status   MRSA by PCR Next Gen NOT DETECTED NOT DETECTED Final    Comment: (NOTE) The GeneXpert MRSA Assay (FDA approved for NASAL specimens only), is one component of a comprehensive MRSA colonization surveillance program. It is not intended to diagnose MRSA infection nor to guide or monitor treatment for MRSA infections. Test performance is not FDA approved in patients less than 39 years old. Performed at [facility], [address] [place], [place] [address]     Coagulation Studies: No results for input(s): "LABPROT", "INR" in the last 72 hours.  Urinalysis: No results for input(s): "COLORURINE", "LABSPEC", "PHURINE", "GLUCOSEU", "HGBUR", "BILIRUBINUR", "KETONESUR", "PROTEINUR", "UROBILINOGEN", "NITRITE", "LEUKOCYTESUR" in the last 72 hours.  Invalid input(s): "APPERANCEUR"    Imaging: DG Chest Port 1 View  Result Date: 08/06/2022 CLINICAL DATA:  Chest pain with weakness and fatigue. EXAM: PORTABLE CHEST 1 VIEW COMPARISON:  Mar 24, 2022 FINDINGS: There is stable right-sided venous Port-A-Cath positioning. The heart size and mediastinal contours are within normal limits. There is mild calcification of the aortic arch. A stable cavitary  lesion is seen within the right upper lobe with adjacent right apical pleural thickening. A stable right suprahilar opacity is also noted. Stable, ill-defined bilateral pulmonary nodules are seen. There is no evidence of an acute infiltrate, pleural effusion or pneumothorax. Postoperative changes are seen within the lower cervical spine. No acute osseous abnormalities are identified. IMPRESSION: 1. Stable right upper lobe cavitary lesion with stable bilateral pulmonary nodules. Electronically Signed   By: Virgina Norfolk M.D.   On: 08/06/2022 21:22     Medications:      atorvastatin  40 mg Oral QHS   azithromycin  500 mg Oral Daily   Chlorhexidine Gluconate Cloth  6 each Topical Q2000   enoxaparin (LOVENOX) injection  40 mg Subcutaneous Q24H   ethambutol  1,200 mg Oral Daily   ferrous sulfate  325 mg Oral QHS   fluticasone furoate-vilanterol  1 puff Inhalation Daily   And   umeclidinium bromide  1 puff Inhalation Daily   gabapentin  100 mg Oral QHS   levothyroxine  150 mcg Oral Q0600   magnesium oxide  600 mg Oral BID   mirtazapine  7.5 mg Oral QHS   nicotine  21 mg Transdermal Daily   pantoprazole  40 mg Oral Daily   rifampin  600 mg Oral Daily   tolvaptan  15 mg Oral Once   albuterol, diazepam, morphine injection, nitroGLYCERIN, ondansetron **OR** ondansetron (ZOFRAN) IV, mouth rinse  Assessment/ Plan:  66 y.o. male with hyponatremia from SIADH, COPD, tobacco abuse, non-small cell lung cancer, hyperlipidemia, coronary disease, hypertension, obstructive sleep apnea, history of MAC infection, hypothyroidism, GERD   admitted on 08/06/2022 for Acute hyponatremia [E87.1] Hyponatremia [E87.1]  Critical hyponatremia due to SIADH Continue free water restriction to about 1200 cc/day Start low dose samsca as no significant clinical improvement with 3% hypertonic saline Monitor sodium at least every 4 hours Goal for sodium correction to approximately 130 by tomorrow morning     LOS:  [place] 10/8/20239:08 AM  [facility], [place]  Note: This note was prepared with Dragon dictation. Any transcription errors are unintentional

## 2022-08-08 NOTE — Progress Notes (Signed)
PROGRESS NOTE    SAAGAR Carpenter  EZM:[id number] DOB: June 03, 1956 DOA: 08/06/2022 PCP: [name], MD   Assessment & Plan:   Principal Problem:   Hyponatremia Active Problems:   Non-small cell lung cancer, right (HCC)   COPD (chronic obstructive pulmonary disease) (HCC)   Obstructive sleep apnea   Benign essential hypertension   Chronic coronary artery disease   Cigarette smoker   Mixed hyperlipidemia   Hypothyroidism, acquired  Assessment and Plan:  Symptomatic hyponatremia: w/ hx of SIADH. No improvement from day prior. Continue on 3% saline. S/p tolvaptan x 1 as per nephro. Nehphro following and recs apprec    COPD: w/o exacerbation. Continue on bronchodilators    Persistent nausea: w/ occasional vomiting.  May be related to lung cancer. Zofran prn    Tobacco abuse: nicotine patch to prevent w/drawl. Received smoking cessation counseling x 5 mins   Non-small cell lung cancer: not on treatment for cancer while on treatment for MAC. Continue w/ outpatient f/u w/ onco, Dr. Grayland Ormond    Hx of CAD: no chest pain. On no meds for this as per med rec    OSA: CPAP qhs    Hx of MAC infection: continue on azithromycin, rifampicin, ethambutol    Hypothyroidism: continue on levothyroxine     GERD: continue on PPI       DVT prophylaxis: lovenox  Code Status: full  Family Communication:  Disposition Plan: likely d/c back home   Level of care: Stepdown   Status is: Inpatient Remains inpatient appropriate because: severity of illness, Na is still low    Consultants:  Nephro   Procedures:   Antimicrobials:   Subjective: Pt c/o weakness  Objective: Vitals:   08/07/22 1900 08/07/22 2000 08/08/22 0000 08/08/22 0400  BP: (!) 121/102 126/80 123/70 119/70  Pulse: 93 79 74 73  Resp: _0 (!) 21  Temp:  98.8 F (37.1 C) 97.6 F (36.4 C)   TempSrc:  Oral Axillary   SpO2: 93% 95% 95% 94%  Weight:      Height:        Intake/Output Summary (Last 24  hours) at 08/08/2022 0835 Last data filed at 08/08/2022 0800 Gross per 24 hour  Intake 1095.77 ml  Output 2325 ml  Net -1229.23 ml   Filed Weights   08/06/22 2049  Weight: 78.9 kg    Examination:  General exam: Appears comfortable   Respiratory system: clear breath sounds b/l  Cardiovascular system: S1/S2+. No rubs or clicks  Gastrointestinal system: Abd is soft, NT, ND & hypoactive bowel sounds  Central nervous system: alert and oriented. Moves all extremities Psychiatry: judgement and insight appears normal. Flat mood and affect     Data Reviewed: I have personally reviewed following labs and imaging studies  CBC: Recent Labs  Lab 08/03/22 0945 08/05/22 1030 08/06/22 2100 08/07/22 0526 08/08/22 0541  WBC 7.1 7.0 5.3 4.8 5.1  NEUTROABS 5.9 5.8 4.3  --   --   HGB 12.9* 13.0 12.4* 12.4* 12.8*  HCT 36.3* 35.0* 34.5* 33.8* 35.2*  MCV 91.2 89.1 88.9 88.9 90.7  PLT 270 326 258 220 503   Basic Metabolic Panel: Recent Labs  Lab 08/06/22 2100 08/06/22 2243 08/07/22 0334 08/07/22 0526 08/07/22 0728 08/07/22 0908 08/07/22 1328 08/07/22 1703 08/07/22 2146 08/08/22 0137 08/08/22 0541  NA 115*   < > 118* 121* 120* 120* 122* 120* 120* 122* 121*  K 4.4   < > 4.3 4.5 4.0 4.0  --   --   --   --  4.9  CL 82*   < > 85* 90* 90* 89*  --   --   --   --  91*  CO2 26   < > _0 --   --   --   --  23  GLUCOSE 116*   < > 89 93 100* 88  --   --   --   --  79  BUN 5*   < > <5* <5* <5* <5*  --   --   --   --  <5*  CREATININE 0.52*   < > 0.40* 0.41* 0.43* 0.35*  --   --   --   --  0.50*  CALCIUM 8.4*   < > 8.3* 8.4* 8.1* 8.1*  --   --   --   --  8.9  MG 1.7  --   --   --   --   --   --   --   --   --   --    < > = values in this interval not displayed.   GFR: Estimated Creatinine Clearance: 101 mL/min (A) (by C-G formula based on SCr of 0.5 mg/dL (L)). Liver Function Tests: Recent Labs  Lab 08/03/22 0945 08/05/22 1030 08/05/22 1155 08/06/22 2100  AST 16 14* 16 18   ALT _1 ALKPHOS 98 95 93 92  BILITOT 0.5 0.6 0.4 0.5  PROT 6.6 6.4* 6.6 6.4*  ALBUMIN 3.7 3.7 3.7 3.7   No results for input(s): "LIPASE", "AMYLASE" in the last 168 hours. No results for input(s): "AMMONIA" in the last 168 hours. Coagulation Profile: No results for input(s): "INR", "PROTIME" in the last 168 hours. Cardiac Enzymes: No results for input(s): "CKTOTAL", "CKMB", "CKMBINDEX", "TROPONINI" in the last 168 hours. BNP (last 3 results) No results for input(s): "PROBNP" in the last 8760 hours. HbA1C: No results for input(s): "HGBA1C" in the last 72 hours. CBG: Recent Labs  Lab 08/06/22 2320  GLUCAP 91   Lipid Profile: No results for input(s): "CHOL", "HDL", "LDLCALC", "TRIG", "CHOLHDL", "LDLDIRECT" in the last 72 hours. Thyroid Function Tests: No results for input(s): "TSH", "T4TOTAL", "FREET4", "T3FREE", "THYROIDAB" in the last 72 hours. Anemia Panel: No results for input(s): "VITAMINB12", "FOLATE", "FERRITIN", "TIBC", "IRON", "RETICCTPCT" in the last 72 hours. Sepsis Labs: No results for input(s): "PROCALCITON", "LATICACIDVEN" in the last 168 hours.  Recent Results (from the past 240 hour(s))  MRSA Next Gen by PCR, Nasal     Status: None   Collection Time: 08/07/22  1:08 AM   Specimen: Nasal Mucosa; Nasal Swab  Result Value Ref Range Status   MRSA by PCR Next Gen NOT DETECTED NOT DETECTED Final    Comment: (NOTE) The GeneXpert MRSA Assay (FDA approved for NASAL specimens only), is one component of a comprehensive MRSA colonization surveillance program. It is not intended to diagnose MRSA infection nor to guide or monitor treatment for MRSA infections. Test performance is not FDA approved in patients less than 75 years old. Performed at [facility], [address], [place], [place] [address]          Radiology Studies: [facility] Chest [place] 1 View  Result Date: 08/06/2022 CLINICAL DATA:  Chest pain with weakness and fatigue. EXAM: PORTABLE  CHEST 1 VIEW COMPARISON:  Mar 24, 2022 FINDINGS: There is stable right-sided venous Port-A-Cath positioning. The heart size and mediastinal contours are within normal limits. There is mild calcification of the aortic arch. A  stable cavitary lesion is seen within the right upper lobe with adjacent right apical pleural thickening. A stable right suprahilar opacity is also noted. Stable, ill-defined bilateral pulmonary nodules are seen. There is no evidence of an acute infiltrate, pleural effusion or pneumothorax. Postoperative changes are seen within the lower cervical spine. No acute osseous abnormalities are identified. IMPRESSION: 1. Stable right upper lobe cavitary lesion with stable bilateral pulmonary nodules. Electronically Signed   By: Virgina Norfolk M.D.   On: 08/06/2022 21:22        Scheduled Meds:  atorvastatin  40 mg Oral QHS   azithromycin  500 mg Oral Daily   Chlorhexidine Gluconate Cloth  6 each Topical Q2000   enoxaparin (LOVENOX) injection  40 mg Subcutaneous Q24H   ethambutol  1,200 mg Oral Daily   ferrous sulfate  325 mg Oral QHS   fluticasone furoate-vilanterol  1 puff Inhalation Daily   And   umeclidinium bromide  1 puff Inhalation Daily   gabapentin  100 mg Oral QHS   levothyroxine  150 mcg Oral Q0600   magnesium oxide  600 mg Oral BID   mirtazapine  7.5 mg Oral QHS   nicotine  21 mg Transdermal Daily   pantoprazole  40 mg Oral Daily   rifampin  600 mg Oral Daily   Continuous Infusions:  sodium chloride (hypertonic) 35 mL/hr at 08/08/22 0642     LOS: 2 days    Time spent: 35 mins     Wyvonnia Dusky, MD [facility] Pager [phone]  If 7PM-7AM, please contact night-coverage [email or web address] 08/08/2022, 8:35 AM

## 2022-08-08 NOTE — Consult Note (Signed)
[place] NOTE  Pharmacy Consult for Tolvaptan Monitoring Indication: Acute on chronic hyponatremia (SIADH s/t lung cancer)  Patient Measurements: Height: 6' (182.9 cm) Weight: 78.9 kg (174 lb) IBW/kg (Calculated) : 77.6  Intake/Output from previous day: 10/07 0701 - 10/08 0700 In: 1095.8 [P.O.:240; I.V.:855.8] Out: 2125 [Urine:2125] Intake/Output from this shift: Total I/O In: -  Out: 200 [Urine:200]  Labs: Recent Labs    08/05/22 1030 08/05/22 1155 08/06/22 2100 08/07/22 0148 08/07/22 0526 08/07/22 0728 08/07/22 0908 08/08/22 0541  WBC 7.0  --  5.3  --  4.8  --   --  5.1  HGB 13.0  --  12.4*  --  12.4*  --   --  12.8*  HCT 35.0*  --  34.5*  --  33.8*  --   --  35.2*  PLT 326  --  258  --  220  --   --  221  CREATININE 0.46* 0.31* 0.52*   < > 0.41* 0.43* 0.35* 0.50*  MG  --   --  1.7  --   --   --   --   --   ALBUMIN 3.7 3.7 3.7  --   --   --   --   --   PROT 6.4* 6.6 6.4*  --   --   --   --   --   AST 14* 16 18  --   --   --   --   --   ALT 10 10 10   --   --   --   --   --   ALKPHOS 95 93 92  --   --   --   --   --   BILITOT 0.6 0.4 0.5  --   --   --   --   --    < > = values in this interval not displayed.   Estimated Creatinine Clearance: 101 mL/min (A) (by C-G formula based on SCr of 0.5 mg/dL (L)).  Medical History: Past Medical History:  Diagnosis Date   Anginal pain ([place])    Anxiety    Asthma    Chest pain    CHF (congestive heart failure) (HCC)    Chicken pox    Complication of anesthesia    o2 dropped after neck fusion   COPD (chronic obstructive pulmonary disease) (HCC)    Coronary artery disease    Cough    chronic  clear phlegm   Dysrhythmia    palpitations   GERD (gastroesophageal reflux disease)    h/o reflux/ hoarsness   Hematochezia    Hemorrhoids    History of chickenpox    History of colon polyps    History of Helicobacter pylori infection    Hoarseness    Hypertension    Lung cancer ([place]) 05/2016   Chemo +  rad tx's.    Migraines    OSA (obstructive sleep apnea)    has CPAP but does not use   Personal history of tobacco use, presenting hazards to health 03/05/2016   Pneumonia    5/17   Raynaud disease    Raynaud disease    Raynaud's disease    Rotator cuff tear    on right   Shortness of breath dyspnea    Sleep apnea    Ulcer (traumatic) of oral mucosa     Medications:  --Hypertonic saline at 35 cc/hr (10/6 PM - 10/8 AM) --Tolvaptan 15 mg PO x 1 10/8  Assessment: Patient is a 66 y/o M with medical history as above and including chronic hyponatremia secondary to SIADH in setting of lung cancer who is admitted with acute on chronic symptomatic hyponatremia. Nephrology following. Patient initially on hypertonic saline which has been discontinued. Plan to switch to tolvaptan. Pharmacy consulted for tolvaptan monitoring.   1.2L fluid restriction ordered. Depending on response to tolvaptan, this may need to be liberalized.  Admit sodium: 117 Sodium pre-tolvaptan: 121  Goal of Therapy:  Na increase of 4-6 mEq/L (maximum 8 mEq/L) in 24h  Plan:  --Tolvaptan 15 mg 10/8 x 1 dose --Pharmacy will continue to monitor   Sheilah Rayos B Deolinda Frid 08/08/2022,9:50 AM

## 2022-08-09 ENCOUNTER — Telehealth: Payer: Self-pay | Admitting: Pulmonary Disease

## 2022-08-09 DIAGNOSIS — G4733 Obstructive sleep apnea (adult) (pediatric): Secondary | ICD-10-CM

## 2022-08-09 DIAGNOSIS — E871 Hypo-osmolality and hyponatremia: Secondary | ICD-10-CM | POA: Diagnosis not present

## 2022-08-09 DIAGNOSIS — C3491 Malignant neoplasm of unspecified part of right bronchus or lung: Secondary | ICD-10-CM | POA: Diagnosis not present

## 2022-08-09 LAB — CBC
HCT: 35.8 % — ABNORMAL LOW (ref 39.0–52.0)
Hemoglobin: 12.9 g/dL — ABNORMAL LOW (ref 13.0–17.0)
MCH: 32.3 pg (ref 26.0–34.0)
MCHC: 36 g/dL (ref 30.0–36.0)
MCV: 89.7 fL (ref 80.0–100.0)
Platelets: 225 10*3/uL (ref 150–400)
RBC: 3.99 MIL/uL — ABNORMAL LOW (ref 4.22–5.81)
RDW: 12 % (ref 11.5–15.5)
WBC: 6.6 10*3/uL (ref 4.0–10.5)
nRBC: 0 % (ref 0.0–0.2)

## 2022-08-09 LAB — BASIC METABOLIC PANEL
Anion gap: 8 (ref 5–15)
BUN: 6 mg/dL — ABNORMAL LOW (ref 8–23)
CO2: 24 mmol/L (ref 22–32)
Calcium: 8.6 mg/dL — ABNORMAL LOW (ref 8.9–10.3)
Chloride: 92 mmol/L — ABNORMAL LOW (ref 98–111)
Creatinine, Ser: 0.53 mg/dL — ABNORMAL LOW (ref 0.61–1.24)
GFR, Estimated: >60 mL/min (ref >60)
Glucose, Bld: 92 mg/dL (ref 70–99)
Potassium: 4.6 mmol/L (ref 3.5–5.1)
Sodium: 124 mmol/L — ABNORMAL LOW (ref 135–145)

## 2022-08-09 LAB — SODIUM
Sodium: 125 mmol/L — ABNORMAL LOW (ref 135–145)
Sodium: 125 mmol/L — ABNORMAL LOW (ref 135–145)
Sodium: 127 mmol/L — ABNORMAL LOW (ref 135–145)
Sodium: 126 mmol/L — ABNORMAL LOW (ref 135–145)
Sodium: 126 mmol/L — ABNORMAL LOW (ref 135–145)

## 2022-08-09 MED ORDER — TOLVAPTAN 15 MG PO TABS
15.0000 mg | ORAL_TABLET | Freq: Once | ORAL | Status: AC
  Administered 2022-08-09: 15 mg via ORAL
  Filled 2022-08-09: qty 1

## 2022-08-09 NOTE — Progress Notes (Signed)
[place], [place] 08/09/22  Subjective:   Hospital day # 3  Patient known to our practice from previous admission as well as outpatient follow-up.  He is seen for acute hyponatremia.  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  Update Patient sitting up eating breakfast Denies nausea and vomiting No lower extremity edema  Sodium 125 UOP 2.1L  Objective:  Vital signs in last 24 hours:  Temp:  [98.2 F (36.8 C)-98.7 F (37.1 C)] 98.7 F (37.1 C) (10/09 0800) Pulse Rate:  [77-103] 87 (10/09 1100) Resp:  [14-32] 18 (10/09 1100) BP: (93-136)/(57-88) 96/57 (10/09 0800) SpO2:  [93 %-96 %] 94 % (10/09 1100)  Weight change:  Filed Weights   08/06/22 2049  Weight: 78.9 kg    Intake/Output:    Intake/Output Summary (Last 24 hours) at 08/09/2022 1138 Last data filed at 08/09/2022 0800 Gross per 24 hour  Intake 425 ml  Output 2125 ml  Net -1700 ml     Physical Exam: General:  No acute distress, laying in the bed  HEENT  anicteric, moist oral mucous membrane  Pulm/lungs  normal breathing effort, lungs are clear to auscultation  CVS/Heart  regular rhythm, no rub or gallop  Abdomen:   Soft, nontender  Extremities:  No peripheral edema  Neurologic:  Alert, oriented, able to follow commands  Skin:  No acute rashes     Basic Metabolic Panel:  Recent Labs  Lab 08/06/22 2100 08/06/22 2243 08/07/22 0526 08/07/22 0728 08/07/22 0908 08/07/22 1328 08/08/22 0541 08/08/22 0942 08/08/22 1731 08/08/22 2130 08/09/22 0133 08/09/22 0511 08/09/22 0914  NA 115*   < > 121* 120* 120*   < > 121*   < > 126* 126* 124* 125* 125*  K 4.4   < > 4.5 4.0 4.0  --  4.9  --   --   --  4.6  --   --   CL 82*   < > 90* 90* 89*  --  91*  --   --   --  92*  --   --   CO2 26   < > _0 --  23  --   --   --  24  --   --   GLUCOSE 116*   < > 93 100* 88  --  79  --   --   --  92  --   --   BUN 5*   < > <5* <5* <5*  --   <5*  --   --   --  6*  --   --   CREATININE 0.52*   < > 0.41* 0.43* 0.35*  --  0.50*  --   --   --  0.53*  --   --   CALCIUM 8.4*   < > 8.4* 8.1* 8.1*  --  8.9  --   --   --  8.6*  --   --   MG 1.7  --   --   --   --   --   --   --   --   --   --   --   --    < > = values in this interval not displayed.      CBC: Recent Labs  Lab 08/03/22 0945 08/05/22 1030 08/06/22 2100 08/07/22 0526 08/08/22 0541 08/09/22 0133  WBC 7.1 7.0 5.3 4.8 5.1 6.6  NEUTROABS 5.9 5.8 4.3  --   --   --  HGB 12.9* 13.0 12.4* 12.4* 12.8* 12.9*  HCT 36.3* 35.0* 34.5* 33.8* 35.2* 35.8*  MCV 91.2 89.1 88.9 88.9 90.7 89.7  PLT 270 326 258 220 221 225      No results found for: "HEPBSAG", "HEPBSAB", "HEPBIGM"    Microbiology:  Recent Results (from the past 240 hour(s))  MRSA Next Gen by PCR, Nasal     Status: None   Collection Time: 08/07/22  1:08 AM   Specimen: Nasal Mucosa; Nasal Swab  Result Value Ref Range Status   MRSA by PCR Next Gen NOT DETECTED NOT DETECTED Final    Comment: (NOTE) The GeneXpert MRSA Assay (FDA approved for NASAL specimens only), is one component of a comprehensive MRSA colonization surveillance program. It is not intended to diagnose MRSA infection nor to guide or monitor treatment for MRSA infections. Test performance is not FDA approved in patients less than 42 years old. Performed at [facility], [address] [place], [place] [address]     Coagulation Studies: No results for input(s): "LABPROT", "INR" in the last 72 hours.  Urinalysis: No results for input(s): "COLORURINE", "LABSPEC", "PHURINE", "GLUCOSEU", "HGBUR", "BILIRUBINUR", "KETONESUR", "PROTEINUR", "UROBILINOGEN", "NITRITE", "LEUKOCYTESUR" in the last 72 hours.  Invalid input(s): "APPERANCEUR"    Imaging: No results found.   Medications:      atorvastatin  40 mg Oral QHS   azithromycin  500 mg Oral Daily   Chlorhexidine Gluconate Cloth  6 each Topical Daily   enoxaparin  (LOVENOX) injection  40 mg Subcutaneous Q24H   ethambutol  1,200 mg Oral Daily   ferrous sulfate  325 mg Oral QHS   fluticasone furoate-vilanterol  1 puff Inhalation Daily   And   umeclidinium bromide  1 puff Inhalation Daily   gabapentin  100 mg Oral QHS   levothyroxine  150 mcg Oral Q0600   magnesium oxide  600 mg Oral BID   mirtazapine  7.5 mg Oral QHS   nicotine  21 mg Transdermal Daily   pantoprazole  40 mg Oral Daily   rifampin  600 mg Oral Daily   tolvaptan  15 mg Oral Once   albuterol, diazepam, morphine injection, nitroGLYCERIN, ondansetron **OR** ondansetron (ZOFRAN) IV, mouth rinse  Assessment/ Plan:  66 y.o. 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 Corrected to 125. Will order additional dose of Samsca today. Will continue to monitor urine output and sodium levels every 4 hours.      LOS: 3 Euleta Belson 10/9/202311:38 AM  [facility] [place], [place]

## 2022-08-09 NOTE — Progress Notes (Signed)
PROGRESS NOTE    Paul Carpenter  TMH:[id number] DOB: [birth year] DOA: 08/06/2022 PCP: [name], MD   Assessment & Plan:   Principal Problem:   Hyponatremia Active Problems:   Non-small cell lung cancer, right (HCC)   COPD (chronic obstructive pulmonary disease) (HCC)   Obstructive sleep apnea   Benign essential hypertension   Chronic coronary artery disease   Cigarette smoker   Mixed hyperlipidemia   Hypothyroidism, acquired  Assessment and Plan:  Symptomatic hyponatremia: w/ hx of SIADH. Na is trending up. S/p tolvaptan x 2 as per nephro. Nephro following and recs apprec   COPD: w/o exacerbation. Continue on bronchodilators & encourage incentive spirometry    Persistent nausea: w/ occasional vomiting.  Zofran prn    Tobacco abuse: received smoking cessation counseling x 5 mins. Nicotine patch to prevent w/drawl    Non-small cell lung cancer: not on treatment for cancer while on treatment for MAC. Continue w/ outpatient f/u w/ onco, Dr. Grayland Ormond    Hx of CAD: no chest pain. On no meds for this as per med rec    OSA: CPAP qhs    Hx of MAC infection: continue on ethambutol, rifampicin, & azithromycin    Hypothyroidism: continue on levothyroxine    GERD: continue on PPI       DVT prophylaxis: lovenox  Code Status: full  Family Communication:  Disposition Plan: likely d/c back home   Level of care: Stepdown   Status is: Inpatient Remains inpatient appropriate because: severity of illness, Na is still low but improving    Consultants:  Nephro   Procedures:   Antimicrobials:   Subjective: Pt c/o fatigue   Objective: Vitals:   08/09/22 1100 08/09/22 1200 08/09/22 1300 08/09/22 1400  BP:  115/74  (!) 92/49  Pulse: 87 82 87 85  Resp: 18 (!) 24 (!) 26 (!) 25  Temp:    99.2 F (37.3 C)  TempSrc:    Oral  SpO2: 94% 93% 95% 97%  Weight:      Height:        Intake/Output Summary (Last 24 hours) at 08/09/2022 1504 Last data filed at  08/09/2022 1400 Gross per 24 hour  Intake 720 ml  Output 975 ml  Net -255 ml   Filed Weights   08/06/22 2049  Weight: 78.9 kg    Examination:  General exam: Appears calm & comfortable  Respiratory system: clear breath sounds b/l. No wheezes, rales  Cardiovascular system: S1 & S2+. No rubs or gallops   Gastrointestinal system: Abd is soft, NT, ND & normal bowel sounds   Central nervous system: alert and oriented. Moves all extremities  Psychiatry: judgement and insight appears normal. Flat mood and affect    Data Reviewed: I have personally reviewed following labs and imaging studies  CBC: Recent Labs  Lab 08/03/22 0945 08/05/22 1030 08/06/22 2100 08/07/22 0526 08/08/22 0541 08/09/22 0133  WBC 7.1 7.0 5.3 4.8 5.1 6.6  NEUTROABS 5.9 5.8 4.3  --   --   --   HGB 12.9* 13.0 12.4* 12.4* 12.8* 12.9*  HCT 36.3* 35.0* 34.5* 33.8* 35.2* 35.8*  MCV 91.2 89.1 88.9 88.9 90.7 89.7  PLT 270 326 258 220 221 921   Basic Metabolic Panel: Recent Labs  Lab 08/06/22 2100 08/06/22 2243 08/07/22 0526 08/07/22 0728 08/07/22 0908 08/07/22 1328 08/08/22 0541 08/08/22 0942 08/08/22 2130 08/09/22 0133 08/09/22 0511 08/09/22 0914 08/09/22 1335  NA 115*   < > 121* 120* 120*   < >  121*   < > 126* 124* 125* 125* 127*  K 4.4   < > 4.5 4.0 4.0  --  4.9  --   --  4.6  --   --   --   CL 82*   < > 90* 90* 89*  --  91*  --   --  92*  --   --   --   CO2 26   < > _0 --  23  --   --  24  --   --   --   GLUCOSE 116*   < > 93 100* 88  --  79  --   --  92  --   --   --   BUN 5*   < > <5* <5* <5*  --  <5*  --   --  6*  --   --   --   CREATININE 0.52*   < > 0.41* 0.43* 0.35*  --  0.50*  --   --  0.53*  --   --   --   CALCIUM 8.4*   < > 8.4* 8.1* 8.1*  --  8.9  --   --  8.6*  --   --   --   MG 1.7  --   --   --   --   --   --   --   --   --   --   --   --    < > = values in this interval not displayed.   GFR: Estimated Creatinine Clearance: 101 mL/min (A) (by C-G formula based on SCr of  0.53 mg/dL (L)). Liver Function Tests: Recent Labs  Lab 08/03/22 0945 08/05/22 1030 08/05/22 1155 08/06/22 2100 08/08/22 0942  AST 16 14* _1 ALT _2 ALKPHOS 98 95 93 92 98  BILITOT 0.5 0.6 0.4 0.5 0.3  PROT 6.6 6.4* 6.6 6.4* 6.1*  ALBUMIN 3.7 3.7 3.7 3.7 3.4*   No results for input(s): "LIPASE", "AMYLASE" in the last 168 hours. No results for input(s): "AMMONIA" in the last 168 hours. Coagulation Profile: No results for input(s): "INR", "PROTIME" in the last 168 hours. Cardiac Enzymes: No results for input(s): "CKTOTAL", "CKMB", "CKMBINDEX", "TROPONINI" in the last 168 hours. BNP (last 3 results) No results for input(s): "PROBNP" in the last 8760 hours. HbA1C: No results for input(s): "HGBA1C" in the last 72 hours. CBG: Recent Labs  Lab 08/06/22 2320 08/08/22 1702  GLUCAP 91 133*   Lipid Profile: No results for input(s): "CHOL", "HDL", "LDLCALC", "TRIG", "CHOLHDL", "LDLDIRECT" in the last 72 hours. Thyroid Function Tests: No results for input(s): "TSH", "T4TOTAL", "FREET4", "T3FREE", "THYROIDAB" in the last 72 hours. Anemia Panel: No results for input(s): "VITAMINB12", "FOLATE", "FERRITIN", "TIBC", "IRON", "RETICCTPCT" in the last 72 hours. Sepsis Labs: No results for input(s): "PROCALCITON", "LATICACIDVEN" in the last 168 hours.  Recent Results (from the past 240 hour(s))  MRSA Next Gen by PCR, Nasal     Status: None   Collection Time: 08/07/22  1:08 AM   Specimen: Nasal Mucosa; Nasal Swab  Result Value Ref Range Status   MRSA by PCR Next Gen NOT DETECTED NOT DETECTED Final    Comment: (NOTE) The GeneXpert MRSA Assay (FDA approved for NASAL specimens only), is one component of a comprehensive MRSA colonization surveillance program. It is not intended to diagnose MRSA infection nor to guide or monitor treatment for MRSA infections.  Test performance is not FDA approved in patients less than 32 years old. Performed at [facility], [address] [place], [place] [address]          Radiology Studies: No results found.      Scheduled Meds:  atorvastatin  40 mg Oral QHS   azithromycin  500 mg Oral Daily   Chlorhexidine Gluconate Cloth  6 each Topical Daily   enoxaparin (LOVENOX) injection  40 mg Subcutaneous Q24H   ethambutol  1,200 mg Oral Daily   ferrous sulfate  325 mg Oral QHS   fluticasone furoate-vilanterol  1 puff Inhalation Daily   And   umeclidinium bromide  1 puff Inhalation Daily   gabapentin  100 mg Oral QHS   levothyroxine  150 mcg Oral Q0600   magnesium oxide  600 mg Oral BID   mirtazapine  7.5 mg Oral QHS   nicotine  21 mg Transdermal Daily   pantoprazole  40 mg Oral Daily   rifampin  600 mg Oral Daily   Continuous Infusions:     LOS: 3 days    Time spent: 25 mins     Wyvonnia Dusky, MD [facility] Pager [phone]  If 7PM-7AM, please contact night-coverage [email or web address] 08/09/2022, 3:04 PM

## 2022-08-09 NOTE — Telephone Encounter (Signed)
Called and spoke with patient's wife, Paul Carpenter (DPR), advised that since he is in patient he does not need to have his appointment virtual tomorrow that I will cancel his appointment for tomorrow.  She was concerned that he was having a lot of anxiety about the results of his CT scan.  I went over the results with her and let her know the results were per Dr. Patsey Berthold.  She asked that I call him and give them to him as well.  I let her know I would call him and review them with him as well.  I called and spoke with patient regarding his CT scan, I provided the results per Dr. Patsey Berthold.  He has not been smoking since he was admitted and his cough is much improved.  Advised him to not start smoking when he returns home as his cough and breathing are improved.  He verbalized understanding.  Nothing further needed.

## 2022-08-10 ENCOUNTER — Ambulatory Visit: Payer: Medicare Other | Admitting: Pulmonary Disease

## 2022-08-10 DIAGNOSIS — E871 Hypo-osmolality and hyponatremia: Secondary | ICD-10-CM | POA: Diagnosis not present

## 2022-08-10 DIAGNOSIS — C3491 Malignant neoplasm of unspecified part of right bronchus or lung: Secondary | ICD-10-CM | POA: Diagnosis not present

## 2022-08-10 DIAGNOSIS — G4733 Obstructive sleep apnea (adult) (pediatric): Secondary | ICD-10-CM | POA: Diagnosis not present

## 2022-08-10 LAB — CBC
HCT: 36.4 % — ABNORMAL LOW (ref 39.0–52.0)
Hemoglobin: 12.8 g/dL — ABNORMAL LOW (ref 13.0–17.0)
MCH: 32 pg (ref 26.0–34.0)
MCHC: 35.2 g/dL (ref 30.0–36.0)
MCV: 91 fL (ref 80.0–100.0)
Platelets: 215 10*3/uL (ref 150–400)
RBC: 4 MIL/uL — ABNORMAL LOW (ref 4.22–5.81)
RDW: 12 % (ref 11.5–15.5)
WBC: 6.3 10*3/uL (ref 4.0–10.5)
nRBC: 0 % (ref 0.0–0.2)

## 2022-08-10 LAB — BASIC METABOLIC PANEL
Anion gap: 6 (ref 5–15)
BUN: 8 mg/dL (ref 8–23)
CO2: 25 mmol/L (ref 22–32)
Calcium: 8.7 mg/dL — ABNORMAL LOW (ref 8.9–10.3)
Chloride: 95 mmol/L — ABNORMAL LOW (ref 98–111)
Creatinine, Ser: 0.47 mg/dL — ABNORMAL LOW (ref 0.61–1.24)
GFR, Estimated: >60 mL/min (ref >60)
Glucose, Bld: 96 mg/dL (ref 70–99)
Potassium: 4.5 mmol/L (ref 3.5–5.1)
Sodium: 126 mmol/L — ABNORMAL LOW (ref 135–145)

## 2022-08-10 LAB — SODIUM
Sodium: 127 mmol/L — ABNORMAL LOW (ref 135–145)
Sodium: 127 mmol/L — ABNORMAL LOW (ref 135–145)
Sodium: 131 mmol/L — ABNORMAL LOW (ref 135–145)
Sodium: 131 mmol/L — ABNORMAL LOW (ref 135–145)
Sodium: 130 mmol/L — ABNORMAL LOW (ref 135–145)

## 2022-08-10 MED ORDER — SODIUM CHLORIDE 1 G PO TABS
1.0000 g | ORAL_TABLET | Freq: Two times a day (BID) | ORAL | Status: DC
  Administered 2022-08-10 – 2022-08-11 (×3): 1 g via ORAL
  Filled 2022-08-10 (×3): qty 1

## 2022-08-10 MED ORDER — TOLVAPTAN 15 MG PO TABS
15.0000 mg | ORAL_TABLET | Freq: Once | ORAL | Status: AC
  Administered 2022-08-10: 15 mg via ORAL
  Filled 2022-08-10: qty 1

## 2022-08-10 NOTE — TOC Initial Note (Signed)
Transition of Care [facility]) - Initial/Assessment Note    Patient Details  Name: RAMAJ FRANGOS MRN: [id number] Date of Birth: [birth year]  Transition of Care [facility]) CM/SW Contact:    Laurena Slimmer, RN Phone Number: 08/10/2022, 4:27 PM  Clinical Narrative:                  Transition of Care [facility]) Screening Note   Patient Details  Name: BRANDY KABAT Date of Birth: [birth year]   Transition of Care [facility]) CM/SW Contact:    Laurena Slimmer, RN Phone Number: 08/10/2022, 4:27 PM    Transition of Care Department [facility]) has reviewed patient and no TOC needs have been identified at this time. We will continue to monitor patient advancement through interdisciplinary progression rounds. If new patient transition needs arise, please place a TOC consult.          Patient Goals and CMS Choice        Expected Discharge Plan and Services                                                Prior Living Arrangements/Services                       Activities of Daily Living      Permission Sought/Granted                  Emotional Assessment              Admission diagnosis:  Acute hyponatremia [E87.1] Hyponatremia [E87.1] Patient Active Problem List   Diagnosis Date Noted   Shortness of breath 02/03/2022   Musculoskeletal chest pain 02/03/2022   SIADH (syndrome of inappropriate ADH production) ([place]) 09/22/2021   Overweight (BMI 25.0-29.9) 09/22/2021   Hypomagnesemia 09/21/2021   Elevated hemoglobin A1c 06/19/2020   Hypothyroidism, acquired 05/30/2019   Squamous cell lung cancer, right ([place]) 05/30/2019   HFrEF (heart failure with reduced ejection fraction) ([place]) 03/06/2019   Atherosclerosis 12/14/2018   Non-small cell lung cancer, right ([place]) 04/24/2018   Oral ulcer 02/16/2018   Pituitary disorder ([place]) 02/16/2018   Migraine headache 03/07/2017   HCAP (healthcare-associated pneumonia) 09/11/2016   COPD exacerbation ([place]) 09/11/2016    Hyponatremia 09/11/2016   Leukocytosis 09/11/2016   Thrombocytopenia ([place]) 09/11/2016   Sepsis ([place]) 09/09/2016   Cigarette smoker 06/11/2016   Cervical radiculopathy 04/15/2016   Cervical disc disorder at C5-C6 level with radiculopathy 03/09/2016   Impingement syndrome of right shoulder 03/09/2016   Personal history of tobacco use, presenting hazards to health 03/05/2016   Health care maintenance 09/29/2015   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   Benign essential hypertension 05/28/2015   Polycythemia 03/24/2015   Carotid artery disease ([place]) 12/12/2014   Disequilibrium 12/12/2014   Mixed hyperlipidemia 12/10/2014   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 headache (migraine equivalents) 08/12/2014   Incomplete emptying of bladder 06/04/2014   Anxiety 05/18/2014   Chronic coronary artery disease 05/18/2014   Chronic headaches 05/18/2014   Acute shoulder pain 03/15/2014   Impingement syndrome of left shoulder 03/15/2014   Lung mass 12/06/2013   Kidney stone 11/24/2013   COPD (chronic obstructive pulmonary disease) ([place]) 04/24/2013   Tobacco use disorder 04/24/2013   Obstructive sleep apnea 04/24/2013   Benign localized prostatic hyperplasia with lower urinary tract symptoms (LUTS) 07/04/2012   Encounter for long-term current use  of medication 07/04/2012   ED (erectile dysfunction) of organic origin 07/04/2012   Testicular hypofunction 07/04/2012   PCP:  [name], MD Pharmacy:   [id number] [place], [address] [place] [address] Phone: [phone] Fax: [phone]     Social Determinants of Health (SDOH) Interventions    Readmission Risk Interventions     No data to display

## 2022-08-10 NOTE — Progress Notes (Signed)
[place], [place] 08/10/22  Subjective:   Hospital day # 4  Patient known to our practice from previous admission as well as outpatient follow-up.  He is seen for acute hyponatremia.  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  Update Patient seen ambulating in room Complains of fatigue No lower extremity edema Appetite appropriate.   Sodium 127 UOP 2.75L  Objective:  Vital signs in last 24 hours:  Temp:  [97.9 F (36.6 C)-99.5 F (37.5 C)] 97.9 F (36.6 C) (10/10 0807) Pulse Rate:  [77-95] 90 (10/10 0807) Resp:  [18-29] 18 (10/10 0807) BP: (92-144)/(49-79) 128/78 (10/10 0807) SpO2:  [93 %-100 %] 98 % (10/10 0807)  Weight change:  Filed Weights   08/06/22 2049  Weight: 78.9 kg    Intake/Output:    Intake/Output Summary (Last 24 hours) at 08/10/2022 1111 Last data filed at 08/10/2022 1105 Gross per 24 hour  Intake 580 ml  Output 2850 ml  Net -2270 ml     Physical Exam: General:  No acute distress, ambulating in room  HEENT  anicteric, moist oral mucous membrane  Pulm/lungs  normal breathing effort, lungs are clear to auscultation  CVS/Heart  regular rhythm, no rub or gallop  Abdomen:   Soft, nontender  Extremities:  No peripheral edema  Neurologic:  Alert, oriented, able to follow commands  Skin:  No acute rashes     Basic Metabolic Panel:  Recent Labs  Lab 08/06/22 2100 08/06/22 2243 08/07/22 0728 08/07/22 0908 08/07/22 1328 08/08/22 0541 08/08/22 0942 08/09/22 0133 08/09/22 0511 08/09/22 1744 08/09/22 2119 08/10/22 0244 08/10/22 0527 08/10/22 0929  NA 115*   < > 120* 120*   < > 121*   < > 124*   < > 126* 126* 126* 127* 127*  K 4.4   < > 4.0 4.0  --  4.9  --  4.6  --   --   --  4.5  --   --   CL 82*   < > 90* 89*  --  91*  --  92*  --   --   --  95*  --   --   CO2 26   < > 27 26  --  23  --  24  --   --   --  25  --   --   GLUCOSE 116*   < > 100* 88  --  79  --  92   --   --   --  96  --   --   BUN 5*   < > <5* <5*  --  <5*  --  6*  --   --   --  8  --   --   CREATININE 0.52*   < > 0.43* 0.35*  --  0.50*  --  0.53*  --   --   --  0.47*  --   --   CALCIUM 8.4*   < > 8.1* 8.1*  --  8.9  --  8.6*  --   --   --  8.7*  --   --   MG 1.7  --   --   --   --   --   --   --   --   --   --   --   --   --    < > = values in this interval not displayed.  CBC: Recent Labs  Lab 08/05/22 1030 08/06/22 2100 08/07/22 0526 08/08/22 0541 08/09/22 0133 08/10/22 0244  WBC 7.0 5.3 4.8 5.1 6.6 6.3  NEUTROABS 5.8 4.3  --   --   --   --   HGB 13.0 12.4* 12.4* 12.8* 12.9* 12.8*  HCT 35.0* 34.5* 33.8* 35.2* 35.8* 36.4*  MCV 89.1 88.9 88.9 90.7 89.7 91.0  PLT 326 258 220 221 225 215      No results found for: "HEPBSAG", "HEPBSAB", "HEPBIGM"    Microbiology:  Recent Results (from the past 240 hour(s))  MRSA Next Gen by PCR, Nasal     Status: None   Collection Time: 08/07/22  1:08 AM   Specimen: Nasal Mucosa; Nasal Swab  Result Value Ref Range Status   MRSA by PCR Next Gen NOT DETECTED NOT DETECTED Final    Comment: (NOTE) The GeneXpert MRSA Assay (FDA approved for NASAL specimens only), is one component of a comprehensive MRSA colonization surveillance program. It is not intended to diagnose MRSA infection nor to guide or monitor treatment for MRSA infections. Test performance is not FDA approved in patients less than 69 years old. Performed at [facility], [address] [place], [place] [address]     Coagulation Studies: No results for input(s): "LABPROT", "INR" in the last 72 hours.  Urinalysis: No results for input(s): "COLORURINE", "LABSPEC", "PHURINE", "GLUCOSEU", "HGBUR", "BILIRUBINUR", "KETONESUR", "PROTEINUR", "UROBILINOGEN", "NITRITE", "LEUKOCYTESUR" in the last 72 hours.  Invalid input(s): "APPERANCEUR"    Imaging: No results found.   Medications:      atorvastatin  40 mg Oral QHS   azithromycin  500 mg Oral Daily    Chlorhexidine Gluconate Cloth  6 each Topical Daily   enoxaparin (LOVENOX) injection  40 mg Subcutaneous Q24H   ethambutol  1,200 mg Oral Daily   ferrous sulfate  325 mg Oral QHS   fluticasone furoate-vilanterol  1 puff Inhalation Daily   And   umeclidinium bromide  1 puff Inhalation Daily   gabapentin  100 mg Oral QHS   levothyroxine  150 mcg Oral Q0600   magnesium oxide  600 mg Oral BID   mirtazapine  7.5 mg Oral QHS   nicotine  21 mg Transdermal Daily   pantoprazole  40 mg Oral Daily   rifampin  600 mg Oral Daily   sodium chloride  1 g Oral BID WC   albuterol, diazepam, morphine injection, nitroGLYCERIN, ondansetron **OR** ondansetron (ZOFRAN) IV, mouth rinse  Assessment/ Plan:  66 y.o. 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  Continue water restriction to about 1200 cc/day. Will also order Salt tabs 1g twice daily. Continue sodium levels every 4 hours.    LOS: 4[place]10/10/202311:11 AM  [place]

## 2022-08-10 NOTE — Care Management Important Message (Signed)
Important Message  Patient Details  Name: Paul Carpenter MRN: [id number] Date of Birth: [birth year]   Medicare Important Message Given:  Yes     Juliann Pulse A Rayne Cowdrey 08/10/2022, 11:39 AM

## 2022-08-10 NOTE — Progress Notes (Addendum)
PROGRESS NOTE    HPI was taken from Dr. Jonelle Sidle: Paul Carpenter is a 66 y.o. male with medical history significant of chronic hyponatremia, lung cancer, coronary artery disease, COPD, ongoing tobacco abuse, essential hypertension, history of radiation with chemotherapy who was being followed by Dr. Candiss Norse in the outpatient setting for the hyponatremia.  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  Patient has been on sodium chloride salt.  For the last week or so he has been nauseated and not eating much.  Has been drinking a lot of Gatorade and tea.  Patient's sodium dropped to 115.  He was seen in the office yesterday with recommendations for him to come to the ER but did not.  Dr. Candiss Norse therefore recommended the patient should go up on his sodium chloride salt.  He was taking 1 g 3 times a day but was asked to take up to 8 g a day.  Despite that sodium remained 115 today.  He was feeling worse so patient came to the ER for further evaluation and treatment.  He has consulted nephrology with recommendation for patient to be admitted to stepdown unit.  Plan is to initiate 3% saline at 30 cc an hour.  Patient is weak.  He has some cough.  He is however mainly nauseated with some episodes of vomiting yesterday.  Today however mainly gagging.  No vomiting in the ER.   As per Dr. Jimmye Norman 10/7-10/10/23: Pt presented w/ symptomatic hyponatremia and was initially put on 3% saline drip with little improvement. Pt has had tolvaptan x 2 and will be given a 3rd dose today as well as started on NaCl tabs as per nephro. 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 Of note, pt has MAC infection and currently on abxs and not on chemo. (Pt has hx of non-small cell lung cancer).    LEMAR BAKOS  OVF:[id number] DOB: [birth year] DOA: 08/06/2022 PCP: [name], MD   Assessment & Plan:   Principal Problem:   Hyponatremia Active Problems:   Non-small cell lung cancer, right (HCC)   COPD (chronic obstructive pulmonary disease) (HCC)    Obstructive sleep apnea   Benign essential hypertension   Chronic coronary artery disease   Cigarette smoker   Mixed hyperlipidemia   Hypothyroidism, acquired  Assessment and Plan:  Symptomatic hyponatremia: w/ hx of SIADH. Na level is trending up. S/p tolvaptan x 2 and will give a 3rd dose today and start NaCl tabs as per nephro. Continue w/ fluid restrtion 1200 mL daily as per nephro. Na level q4h. Nephro following and recs apprec    COPD: w/o exacerbation. Continue on bronchodilators & encourage incentive spirometry    Persistent nausea: w/ occasional vomiting.  Zofran prn    Tobacco abuse: nicotine patch to prevent w/drawl. Received smoking cessation counseling x 5 mins    Non-small cell lung cancer: not on treatment for cancer while on treatment for MAC. Continue w/ outpatient f/u w/ onco, Dr. Grayland Ormond    Hx of CAD: no chest pain. Continue on statin   OSA: CPAP qhs    Hx of MAC infection: continue on azithromycin, ethambutol, rifampicin     Hypothyroidism:  continue on levothyroxine    GERD: continue on PPI       DVT prophylaxis: lovenox  Code Status: full  Family Communication: discussed pt's care w/ pt's wife at bedside and answered her questions  Disposition Plan: likely d/c back home   Level of care: Med-Surg   Status is: Inpatient Remains inpatient  appropriate because: severity of illness, Na is still low but improving    Consultants:  Nephro   Procedures:   Antimicrobials:   Subjective: Pt c/o malaise and wanting to go home   Objective: Vitals:   08/09/22 2300 08/10/22 0014 08/10/22 0109 08/10/22 0527  BP:  103/70 116/73 122/79  Pulse: 83 77 77 78  Resp: (!) _0 Temp:   97.9 F (36.6 C) 98 F (36.7 C)  TempSrc:      SpO2: 94% 96% 97% 100%  Weight:      Height:        Intake/Output Summary (Last 24 hours) at 08/10/2022 0747 Last data filed at 08/10/2022 0600 Gross per 24 hour  Intake 820 ml  Output 2750 ml  Net -1930 ml    Filed Weights   08/06/22 2049  Weight: 78.9 kg    Examination:  General exam: Appears comfortable  Respiratory system: decreased breath sounds b/l  Cardiovascular system: S1/S2+. No rubs or clicks  Gastrointestinal system: Abd is soft, NT, ND & normal bowel sounds  Central nervous system: Alert and oriented. Moves all extremities  Psychiatry: judgement and insight appears normal. Flat mood and affect     Data Reviewed: I have personally reviewed following labs and imaging studies  CBC: Recent Labs  Lab 08/03/22 0945 08/05/22 1030 08/06/22 2100 08/07/22 0526 08/08/22 0541 08/09/22 0133 08/10/22 0244  WBC 7.1 7.0 5.3 4.8 5.1 6.6 6.3  NEUTROABS 5.9 5.8 4.3  --   --   --   --   HGB 12.9* 13.0 12.4* 12.4* 12.8* 12.9* 12.8*  HCT 36.3* 35.0* 34.5* 33.8* 35.2* 35.8* 36.4*  MCV 91.2 89.1 88.9 88.9 90.7 89.7 91.0  PLT 270 326 258 220 221 225 474   Basic Metabolic Panel: Recent Labs  Lab 08/06/22 2100 08/06/22 2243 08/07/22 0728 08/07/22 0908 08/07/22 1328 08/08/22 0541 08/08/22 0942 08/09/22 0133 08/09/22 0511 08/09/22 1335 08/09/22 1744 08/09/22 2119 08/10/22 0244 08/10/22 0527  NA 115*   < > 120* 120*   < > 121*   < > 124*   < > 127* 126* 126* 126* 127*  K 4.4   < > 4.0 4.0  --  4.9  --  4.6  --   --   --   --  4.5  --   CL 82*   < > 90* 89*  --  91*  --  92*  --   --   --   --  95*  --   CO2 26   < > 27 26  --  23  --  24  --   --   --   --  25  --   GLUCOSE 116*   < > 100* 88  --  79  --  92  --   --   --   --  96  --   BUN 5*   < > <5* <5*  --  <5*  --  6*  --   --   --   --  8  --   CREATININE 0.52*   < > 0.43* 0.35*  --  0.50*  --  0.53*  --   --   --   --  0.47*  --   CALCIUM 8.4*   < > 8.1* 8.1*  --  8.9  --  8.6*  --   --   --   --  8.7*  --  MG 1.7  --   --   --   --   --   --   --   --   --   --   --   --   --    < > = values in this interval not displayed.   GFR: Estimated Creatinine Clearance: 101 mL/min (A) (by C-G formula based on SCr of 0.47  mg/dL (L)). Liver Function Tests: Recent Labs  Lab 08/03/22 0945 08/05/22 1030 08/05/22 1155 08/06/22 2100 08/08/22 0942  AST 16 14* _0 ALT _1 ALKPHOS 98 95 93 92 98  BILITOT 0.5 0.6 0.4 0.5 0.3  PROT 6.6 6.4* 6.6 6.4* 6.1*  ALBUMIN 3.7 3.7 3.7 3.7 3.4*   No results for input(s): "LIPASE", "AMYLASE" in the last 168 hours. No results for input(s): "AMMONIA" in the last 168 hours. Coagulation Profile: No results for input(s): "INR", "PROTIME" in the last 168 hours. Cardiac Enzymes: No results for input(s): "CKTOTAL", "CKMB", "CKMBINDEX", "TROPONINI" in the last 168 hours. BNP (last 3 results) No results for input(s): "PROBNP" in the last 8760 hours. HbA1C: No results for input(s): "HGBA1C" in the last 72 hours. CBG: Recent Labs  Lab 08/06/22 2320 08/08/22 1702  GLUCAP 91 133*   Lipid Profile: No results for input(s): "CHOL", "HDL", "LDLCALC", "TRIG", "CHOLHDL", "LDLDIRECT" in the last 72 hours. Thyroid Function Tests: No results for input(s): "TSH", "T4TOTAL", "FREET4", "T3FREE", "THYROIDAB" in the last 72 hours. Anemia Panel: No results for input(s): "VITAMINB12", "FOLATE", "FERRITIN", "TIBC", "IRON", "RETICCTPCT" in the last 72 hours. Sepsis Labs: No results for input(s): "PROCALCITON", "LATICACIDVEN" in the last 168 hours.  Recent Results (from the past 240 hour(s))  MRSA Next Gen by PCR, Nasal     Status: None   Collection Time: 08/07/22  1:08 AM   Specimen: Nasal Mucosa; Nasal Swab  Result Value Ref Range Status   MRSA by PCR Next Gen NOT DETECTED NOT DETECTED Final    Comment: (NOTE) The GeneXpert MRSA Assay (FDA approved for NASAL specimens only), is one component of a comprehensive MRSA colonization surveillance program. It is not intended to diagnose MRSA infection nor to guide or monitor treatment for MRSA infections. Test performance is not FDA approved in patients less than 50 years old. Performed at [facility], [address] [place], [place] [address]          Radiology Studies: No results found.      Scheduled Meds:  atorvastatin  40 mg Oral QHS   azithromycin  500 mg Oral Daily   Chlorhexidine Gluconate Cloth  6 each Topical Daily   enoxaparin (LOVENOX) injection  40 mg Subcutaneous Q24H   ethambutol  1,200 mg Oral Daily   ferrous sulfate  325 mg Oral QHS   fluticasone furoate-vilanterol  1 puff Inhalation Daily   And   umeclidinium bromide  1 puff Inhalation Daily   gabapentin  100 mg Oral QHS   levothyroxine  150 mcg Oral Q0600   magnesium oxide  600 mg Oral BID   mirtazapine  7.5 mg Oral QHS   nicotine  21 mg Transdermal Daily   pantoprazole  40 mg Oral Daily   rifampin  600 mg Oral Daily   Continuous Infusions:     LOS: 4 days    Time spent: 25 mins     Wyvonnia Dusky, MD [facility] Pager [phone]  If 7PM-7AM, please contact night-coverage [email or web address] 08/10/2022, 7:47 AM

## 2022-08-11 DIAGNOSIS — I10 Essential (primary) hypertension: Secondary | ICD-10-CM

## 2022-08-11 DIAGNOSIS — E871 Hypo-osmolality and hyponatremia: Secondary | ICD-10-CM | POA: Diagnosis not present

## 2022-08-11 DIAGNOSIS — C3491 Malignant neoplasm of unspecified part of right bronchus or lung: Secondary | ICD-10-CM | POA: Diagnosis not present

## 2022-08-11 DIAGNOSIS — E039 Hypothyroidism, unspecified: Secondary | ICD-10-CM | POA: Diagnosis not present

## 2022-08-11 LAB — BASIC METABOLIC PANEL
Anion gap: 8 (ref 5–15)
BUN: 8 mg/dL (ref 8–23)
CO2: 26 mmol/L (ref 22–32)
Calcium: 8.8 mg/dL — ABNORMAL LOW (ref 8.9–10.3)
Chloride: 97 mmol/L — ABNORMAL LOW (ref 98–111)
Creatinine, Ser: 0.5 mg/dL — ABNORMAL LOW (ref 0.61–1.24)
GFR, Estimated: >60 mL/min (ref >60)
Glucose, Bld: 87 mg/dL (ref 70–99)
Potassium: 4.5 mmol/L (ref 3.5–5.1)
Sodium: 131 mmol/L — ABNORMAL LOW (ref 135–145)

## 2022-08-11 LAB — CBC
HCT: 37.7 % — ABNORMAL LOW (ref 39.0–52.0)
Hemoglobin: 13.1 g/dL (ref 13.0–17.0)
MCH: 32.1 pg (ref 26.0–34.0)
MCHC: 34.7 g/dL (ref 30.0–36.0)
MCV: 92.4 fL (ref 80.0–100.0)
Platelets: 233 10*3/uL (ref 150–400)
RBC: 4.08 MIL/uL — ABNORMAL LOW (ref 4.22–5.81)
RDW: 12 % (ref 11.5–15.5)
WBC: 5.7 10*3/uL (ref 4.0–10.5)
nRBC: 0 % (ref 0.0–0.2)

## 2022-08-11 LAB — SODIUM
Sodium: 130 mmol/L — ABNORMAL LOW (ref 135–145)
Sodium: 128 mmol/L — ABNORMAL LOW (ref 135–145)
Sodium: 128 mmol/L — ABNORMAL LOW (ref 135–145)

## 2022-08-11 MED ORDER — FUROSEMIDE 20 MG PO TABS
20.0000 mg | ORAL_TABLET | Freq: Every day | ORAL | Status: DC
  Administered 2022-08-11: 20 mg via ORAL
  Filled 2022-08-11: qty 1

## 2022-08-11 MED ORDER — FUROSEMIDE 20 MG PO TABS: 20.0000 mg | ORAL_TABLET | Freq: Every day | ORAL | 1 refills | Status: DC

## 2022-08-11 MED ORDER — SODIUM CHLORIDE 1 G PO TABS
1.0000 g | ORAL_TABLET | Freq: Three times a day (TID) | ORAL | Status: DC
  Administered 2022-08-11: 1 g via ORAL
  Filled 2022-08-11: qty 1

## 2022-08-11 NOTE — Discharge Summary (Signed)
Physician Discharge Summary   Patient: Paul Carpenter MRN: [id number] DOB: 14-Jul-1956  Admit date:     08/06/2022  Discharge date: 08/12/22  Discharge Physician: Annita Brod   PCP: [name], MD   Recommendations at discharge:   New medication: Lasix 20 mg p.o. daily Patient will follow-up with his PCP and have a basic metabolic panel checked next week looking at his sodium levels  Discharge Diagnoses: Principal Problem:   Hyponatremia Active Problems:   Non-small cell lung cancer, right (HCC)   COPD (chronic obstructive pulmonary disease) ([place])   Obstructive sleep apnea   Benign essential hypertension   Chronic coronary artery disease   Cigarette smoker   Mixed hyperlipidemia   Hypothyroidism, acquired  Resolved Problems:   * No resolved hospital problems. *  Hospital Course: 66 year old male with past medical history of lung cancer with chronic hyponatremia from SIADH, COPD, tobacco abuse and hypertension who had been started on sodium chloride tablets for hyponatremia, but continued to feel weak and had poor p.o. intake.  On day prior to admission, sodium found to be 115 and initially was recommended to go to the ER, but he delayed this for day but when feeling worse then came in.  Patient mid to the hospital service and initially started on 3% saline drip with little improvement.  Nephrology consulted.  Then given 12 at 10x3 and restarted on sodium chloride tablets.  Sodium did improve up to as high as 131, but then started to trend back down slightly.  Patient started on p.o. Lasix to help the situation.  Sodium down to 128, but patient is point feeling better and did not want to stay further in the hospital.  Since he was overall stable and much improved from admission, discharged home.  Assessment and Plan: Symptomatic hyponatremia: w/ hx of SIADH.  Little improvement to 3% saline, but did respond to tolvaptan x3.  Restarted on sodium chloride tablets.   Nephrology followed patient during hospitalization.  Continued on fluid restriction.  By day of discharge much improved from admission although had started to trend back downward and patient started on low-dose Lasix which she was given a prescription for.  Outpatient follow-up with his PCP next week to check his sodium level.     COPD: w/o exacerbation. Continue on bronchodilators & encourage incentive spirometry    Persistent nausea: w/ occasional vomiting.  Zofran prn    Tobacco abuse: nicotine patch to prevent w/drawl. Received smoking cessation counseling x 5 mins    Non-small cell lung cancer: not on treatment for cancer while on treatment for MAC. Continue w/ outpatient f/u w/ onco, Dr. Grayland Ormond    Hx of CAD: no chest pain. Continue on statin   OSA: CPAP qhs    Hx of MAC infection: continue on azithromycin, ethambutol, rifampicin     Hypothyroidism:  continue on levothyroxine    GERD: continue on PPI        Pain control - [place] Controlled Substance Reporting System database was reviewed. and patient was instructed, not to drive, operate heavy machinery, perform activities at heights, swimming or participation in water activities or provide baby-sitting services while on Pain, Sleep and Anxiety Medications; until their outpatient Physician has advised to do so again. Also recommended to not to take more than prescribed Pain, Sleep and Anxiety Medications.  Consultants: Nephrology Procedures performed: None Disposition: Home Diet recommendation:  Discharge Diet Orders (From admission, onward)     Start     Ordered  08/11/22 0000  Diet - low sodium heart healthy        08/11/22 1146           Fluid restricted diet DISCHARGE MEDICATION: Allergies as of 08/11/2022       Reactions   Lisinopril Rash   Varenicline Rash        Medication List     TAKE these medications    acetaminophen 500 MG tablet Commonly known as: TYLENOL Take 500 mg by mouth daily  as needed.   albuterol 108 (90 Base) MCG/ACT inhaler Commonly known as: ProAir HFA Inhale 2 puffs into the lungs every 4 (four) hours as needed for wheezing or shortness of breath.   albuterol (2.5 MG/3ML) 0.083% nebulizer solution Commonly known as: PROVENTIL Take 3 mLs (2.5 mg total) by nebulization 4 (four) times daily as needed for wheezing or shortness of breath.   atorvastatin 40 MG tablet Commonly known as: LIPITOR Take 40 mg by mouth at bedtime.   azithromycin 500 MG tablet Commonly known as: ZITHROMAX Take 500 mg by mouth daily.   Breztri Aerosphere 160-9-4.8 MCG/ACT Aero Generic drug: Budeson-Glycopyrrol-Formoterol Inhale 2 puffs into the lungs in the morning and at bedtime.   Breztri Aerosphere 160-9-4.8 MCG/ACT Aero Generic drug: Budeson-Glycopyrrol-Formoterol Inhale 2 puffs into the lungs in the morning and at bedtime.   diazepam 5 MG tablet Commonly known as: VALIUM Take 5 mg by mouth at bedtime as needed.   dronabinol 5 MG capsule Commonly known as: MARINOL Take 1 capsule (5 mg total) by mouth 2 (two) times daily before lunch and supper.   ethambutol 400 MG tablet Commonly known as: MYAMBUTOL Take 3 tablets (1,200 mg total) by mouth daily.   ferrous sulfate 325 (65 FE) MG tablet Take 325 mg by mouth at bedtime.   furosemide 20 MG tablet Commonly known as: LASIX Take 1 tablet (20 mg total) by mouth daily.   gabapentin 100 MG capsule Commonly known as: Neurontin Take 1 capsule (100 mg total) by mouth at bedtime.   levothyroxine 150 MCG tablet Commonly known as: SYNTHROID Take 150 mcg by mouth daily before breakfast.   lidocaine-prilocaine cream Commonly known as: EMLA Apply 1 application topically as needed.   magnesium oxide 400 MG tablet Commonly known as: MAG-OX Take 600 mg by mouth 2 (two) times daily. Takes 1.5 tablet twice daily   mirtazapine 7.5 MG tablet Commonly known as: REMERON Take 1 tablet (7.5 mg total) by mouth at bedtime.    nitroGLYCERIN 0.4 MG SL tablet Commonly known as: NITROSTAT Place under the tongue.   OXYGEN Inhale 2 L into the lungs at bedtime.   pantoprazole 40 MG tablet Commonly known as: PROTONIX Take 1 tablet (40 mg total) by mouth 2 (two) times daily before meals   rifampin 300 MG capsule Commonly known as: RIFADIN Take 2 capsules (600 mg total) by mouth daily.   sodium chloride 1 g tablet Take 1 tablet (1 g total) by mouth 3 (three) times daily with meals.   tadalafil 5 MG tablet Commonly known as: CIALIS Take 1 tablet (5 mg total) by mouth daily as needed for erectile dysfunction.   traMADol 50 MG tablet Commonly known as: ULTRAM Take 50 mg by mouth every 8 (eight) hours as needed.        Follow-up Information     [name], MD. Schedule an appointment as soon as possible for a visit in 1 week(s).   Specialty: Internal Medicine Why: To have your sodium checked  Contact information: [place] [id number] [phone]                Discharge Exam: Danley Danker Weights   08/06/22 2049  Weight: 78.9 kg   General: Alert and oriented x3, no acute distress Cardiovascular: Regular rate and rhythm, S1-S2 Lungs: Clear to auscultation bilaterally  Condition at discharge: good  The results of significant diagnostics from this hospitalization (including imaging, microbiology, ancillary and laboratory) are listed below for reference.   Imaging Studies: DG Chest Port 1 View  Result Date: 08/06/2022 CLINICAL DATA:  Chest pain with weakness and fatigue. EXAM: PORTABLE CHEST 1 VIEW COMPARISON:  Mar 24, 2022 FINDINGS: There is stable right-sided venous Port-A-Cath positioning. The heart size and mediastinal contours are within normal limits. There is mild calcification of the aortic arch. A stable cavitary lesion is seen within the right upper lobe with adjacent right apical pleural thickening. A stable right suprahilar opacity is  also noted. Stable, ill-defined bilateral pulmonary nodules are seen. There is no evidence of an acute infiltrate, pleural effusion or pneumothorax. Postoperative changes are seen within the lower cervical spine. No acute osseous abnormalities are identified. IMPRESSION: 1. Stable right upper lobe cavitary lesion with stable bilateral pulmonary nodules. Electronically Signed   By: Virgina Norfolk M.D.   On: 08/06/2022 21:22   CT SUPER D CHEST WO MONARCH PILOT  Result Date: 08/04/2022 CLINICAL DATA:  Non-small cell lung cancer of the RIGHT lung in a 66 year old male also diagnosed with mycobacterial infection. * Tracking Code: BO * EXAM: CT CHEST WITHOUT CONTRAST TECHNIQUE: Multidetector CT imaging of the chest was performed using thin slice collimation for electromagnetic bronchoscopy planning purposes, without intravenous contrast. RADIATION DOSE REDUCTION: This exam was performed according to the departmental dose-optimization program which includes automated exposure control, adjustment of the mA and/or kV according to patient size and/or use of iterative reconstruction technique. COMPARISON:  Comparison made with June 30, 2022. FINDINGS: Cardiovascular: Calcified aortic atherosclerosis and calcified coronary artery disease. Normal heart size. Small volume pericardial fluid without change. Normal caliber of central pulmonary vessels. Limited assessment of cardiovascular structures given lack of intravenous contrast. Mediastinum/Nodes: RIGHT-sided Port-A-Cath terminates in the lower SVC. Small lymph nodes throughout the mediastinum some with calcification show no change since recent imaging. No thoracic inlet, axillary or mediastinal adenopathy. No gross hilar adenopathy. Esophagus grossly normal and unchanged. Lungs/Pleura: Cavitary area in the RIGHT upper lobe at the level of the posterior RIGHT third rib measuring 7.4 x 6.5 cm is unchanged. More pronounced cavitation along the inferior margin where it  measures approximately 7.4 cm just above the RIGHT hilum which is also similar to prior imaging accounting for changes in lung position. Diffuse nodularity throughout the RIGHT and LEFT chest. RIGHT lower lobe pulmonary nodule (image 114/4) 14 mm, within 1 mm of previous measurement. LEFT lower lobe pulmonary nodule (image 79/3) 11 mm previously 11 mm. LEFT upper lobe nodule (image 67/3) 8 mm as compared to 9 mm. In the anterior LEFT upper lobe a spiculated area previously measuring up to 17 mm is improved now approximately 14 mm. Thickness of this area at 6 mm as compared to 11 mm previously. Multiplicity of tiny nodules shows slight increasing conspicuity in the RIGHT lower lobe in there are areas of mucoid impaction in the RIGHT lower lobe. Mild bronchiectatic changes in the RIGHT middle lobe. Upper Abdomen: Hepatic cysts. Cholelithiasis. Liver is incompletely imaged. Imaged portions of the pancreas, spleen, adrenal glands and  kidneys without acute process. Nephrolithiasis in the upper pole the LEFT kidney is unchanged. No acute gastrointestinal findings. Musculoskeletal: No acute musculoskeletal findings. Chronic appearing RIGHT-sided first rib fracture. Spinal degenerative changes. Chronic fracture of the T6 vertebral body similar to previous imaging. IMPRESSION: 1. Largely stable appearance of dominant cavitary area in the RIGHT upper lobe. 2. Largely stable appearance of dominant areas of nodularity in the chest with interval decrease in size of an area measuring up to 17 mm on the previous study in the anterior LEFT upper lobe. 3. Question slight interval increase in conspicuity of some lower lobe micro nodularity. 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 4. No adenopathy in the chest and no change in the appearance of small lymph nodes, some with calcification. Aortic Atherosclerosis (ICD10-I70.0). Electronically Signed   By: Zetta Bills M.D.   On: 08/04/2022 16:18     Microbiology: Results for orders placed or performed during the hospital encounter of 08/06/22  MRSA Next Gen by PCR, Nasal     Status: None   Collection Time: 08/07/22  1:08 AM   Specimen: Nasal Mucosa; Nasal Swab  Result Value Ref Range Status   MRSA by PCR Next Gen NOT DETECTED NOT DETECTED Final    Comment: (NOTE) The GeneXpert MRSA Assay (FDA approved for NASAL specimens only), is one component of a comprehensive MRSA colonization surveillance program. It is not intended to diagnose MRSA infection nor to guide or monitor treatment for MRSA infections. Test performance is not FDA approved in patients less than 46 years old. Performed at [facility], [address] [place], [place] [address]     Labs: CBC: Recent Labs  Lab 08/06/22 2100 08/07/22 0526 08/08/22 0541 08/09/22 0133 08/10/22 0244 08/11/22 0119  WBC 5.3 4.8 5.1 6.6 6.3 5.7  NEUTROABS 4.3  --   --   --   --   --   HGB 12.4* 12.4* 12.8* 12.9* 12.8* 13.1  HCT 34.5* 33.8* 35.2* 35.8* 36.4* 37.7*  MCV 88.9 88.9 90.7 89.7 91.0 92.4  PLT 258 220 221 225 215 301   Basic Metabolic Panel: Recent Labs  Lab 08/06/22 2100 08/06/22 2243 08/07/22 0908 08/07/22 1328 08/08/22 0541 08/08/22 0942 08/09/22 0133 08/09/22 0511 08/10/22 0244 08/10/22 0527 08/10/22 2037 08/11/22 0119 08/11/22 0538 08/11/22 0933 08/11/22 1336  NA 115*   < > 120*   < > 121*   < > 124*   < > 126*   < > 130* 131* 130* 128* 128*  K 4.4   < > 4.0  --  4.9  --  4.6  --  4.5  --   --  4.5  --   --   --   CL 82*   < > 89*  --  91*  --  92*  --  95*  --   --  97*  --   --   --   CO2 26   < > 26  --  23  --  24  --  25  --   --  26  --   --   --   GLUCOSE 116*   < > 88  --  79  --  92  --  96  --   --  87  --   --   --   BUN 5*   < > <5*  --  <5*  --  6*  --  8  --   --  8  --   --   --   CREATININE 0.52*   < > 0.35*  --  0.50*  --  0.53*  --  0.47*  --   --  0.50*  --   --   --   CALCIUM 8.4*   < > 8.1*  --  8.9  --  8.6*  --   8.7*  --   --  8.8*  --   --   --   MG 1.7  --   --   --   --   --   --   --   --   --   --   --   --   --   --    < > = values in this interval not displayed.   Liver Function Tests: Recent Labs  Lab 08/06/22 2100 08/08/22 0942  AST 18 17  ALT 10 10  ALKPHOS 92 98  BILITOT 0.5 0.3  PROT 6.4* 6.1*  ALBUMIN 3.7 3.4*   CBG: Recent Labs  Lab 08/06/22 2320 08/08/22 1702  GLUCAP 91 133*    Discharge time spent: less than 30 minutes.  Signed: Annita Brod, MD [facility] 08/12/2022

## 2022-08-11 NOTE — Progress Notes (Signed)
Mobility Specialist - Progress Note   08/11/22 1139  Mobility  Activity Ambulated independently in hallway;Ambulated independently to bathroom;Stood at bedside;Dangled on edge of bed  Level of Assistance Independent  Assistive Device None  Distance Ambulated (ft) 180 ft  Activity Response Tolerated well  $Mobility charge 1 Mobility   Pt supine in bed on RA upon arrival. Pt STS and ambulates in restroom and 1 lap around NS Indep. Pt returns to bed with needs in reach.   Gretchen Short  Mobility Specialist  08/11/22 11:40 AM

## 2022-08-11 NOTE — Progress Notes (Signed)
[place], [place] 08/11/22  Subjective:   Hospital day # 5  Patient known to our practice from previous admission as well as outpatient follow-up.  He is seen for acute hyponatremia.  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  Update Patient seen resting comfortably, alert and oriented Denies nausea and vomiting No lower extremity edema Remains on room air  Sodium 130 UOP 1.1L  Objective:  Vital signs in last 24 hours:  Temp:  [98.1 F (36.7 C)-98.8 F (37.1 C)] 98.2 F (36.8 C) (10/11 1144) Pulse Rate:  [78-87] 80 (10/11 1144) Resp:  [16-20] 16 (10/11 1144) BP: (109-122)/(70-80) 119/78 (10/11 1144) SpO2:  [98 %-99 %] 99 % (10/11 1144)  Weight change:  Filed Weights   08/06/22 2049  Weight: 78.9 kg    Intake/Output:    Intake/Output Summary (Last 24 hours) at 08/11/2022 1348 Last data filed at 08/11/2022 0900 Gross per 24 hour  Intake 480 ml  Output 925 ml  Net -445 ml     Physical Exam: General:  No acute distress  HEENT  anicteric, moist oral mucous membrane  Pulm/lungs  normal breathing effort, lungs are clear to auscultation  CVS/Heart  regular rhythm, no rub or gallop  Abdomen:   Soft, nontender  Extremities:  No peripheral edema  Neurologic:  Alert, oriented, able to follow commands  Skin:  No acute rashes     Basic Metabolic Panel:  Recent Labs  Lab 08/06/22 2100 08/06/22 2243 08/07/22 0908 08/07/22 1328 08/08/22 0541 08/08/22 0942 08/09/22 0133 08/09/22 0511 08/10/22 0244 08/10/22 0527 08/10/22 1732 08/10/22 2037 08/11/22 0119 08/11/22 0538 08/11/22 0933  NA 115*   < > 120*   < > 121*   < > 124*   < > 126*   < > 131* 130* 131* 130* 128*  K 4.4   < > 4.0  --  4.9  --  4.6  --  4.5  --   --   --  4.5  --   --   CL 82*   < > 89*  --  91*  --  92*  --  95*  --   --   --  97*  --   --   CO2 26   < > 26  --  23  --  24  --  25  --   --   --  26  --   --    GLUCOSE 116*   < > 88  --  79  --  92  --  96  --   --   --  87  --   --   BUN 5*   < > <5*  --  <5*  --  6*  --  8  --   --   --  8  --   --   CREATININE 0.52*   < > 0.35*  --  0.50*  --  0.53*  --  0.47*  --   --   --  0.50*  --   --   CALCIUM 8.4*   < > 8.1*  --  8.9  --  8.6*  --  8.7*  --   --   --  8.8*  --   --   MG 1.7  --   --   --   --   --   --   --   --   --   --   --   --   --   --    < > =  values in this interval not displayed.      CBC: Recent Labs  Lab 08/05/22 1030 08/06/22 2100 08/07/22 0526 08/08/22 0541 08/09/22 0133 08/10/22 0244 08/11/22 0119  WBC 7.0 5.3 4.8 5.1 6.6 6.3 5.7  NEUTROABS 5.8 4.3  --   --   --   --   --   HGB 13.0 12.4* 12.4* 12.8* 12.9* 12.8* 13.1  HCT 35.0* 34.5* 33.8* 35.2* 35.8* 36.4* 37.7*  MCV 89.1 88.9 88.9 90.7 89.7 91.0 92.4  PLT 326 258 220 221 225 215 233      No results found for: "HEPBSAG", "HEPBSAB", "HEPBIGM"    Microbiology:  Recent Results (from the past 240 hour(s))  MRSA Next Gen by PCR, Nasal     Status: None   Collection Time: 08/07/22  1:08 AM   Specimen: Nasal Mucosa; Nasal Swab  Result Value Ref Range Status   MRSA by PCR Next Gen NOT DETECTED NOT DETECTED Final    Comment: (NOTE) The GeneXpert MRSA Assay (FDA approved for NASAL specimens only), is one component of a comprehensive MRSA colonization surveillance program. It is not intended to diagnose MRSA infection nor to guide or monitor treatment for MRSA infections. Test performance is not FDA approved in patients less than 70 years old. Performed at [facility], [address] [place], [place] [address]     Coagulation Studies: No results for input(s): "LABPROT", "INR" in the last 72 hours.  Urinalysis: No results for input(s): "COLORURINE", "LABSPEC", "PHURINE", "GLUCOSEU", "HGBUR", "BILIRUBINUR", "KETONESUR", "PROTEINUR", "UROBILINOGEN", "NITRITE", "LEUKOCYTESUR" in the last 72 hours.  Invalid input(s): "APPERANCEUR"     Imaging: No results found.   Medications:      atorvastatin  40 mg Oral QHS   azithromycin  500 mg Oral Daily   Chlorhexidine Gluconate Cloth  6 each Topical Daily   enoxaparin (LOVENOX) injection  40 mg Subcutaneous Q24H   ethambutol  1,200 mg Oral Daily   ferrous sulfate  325 mg Oral QHS   fluticasone furoate-vilanterol  1 puff Inhalation Daily   And   umeclidinium bromide  1 puff Inhalation Daily   furosemide  20 mg Oral Daily   gabapentin  100 mg Oral QHS   levothyroxine  150 mcg Oral Q0600   magnesium oxide  600 mg Oral BID   mirtazapine  7.5 mg Oral QHS   nicotine  21 mg Transdermal Daily   pantoprazole  40 mg Oral Daily   rifampin  600 mg Oral Daily   sodium chloride  1 g Oral TID WC   albuterol, diazepam, morphine injection, nitroGLYCERIN, ondansetron **OR** ondansetron (ZOFRAN) IV, mouth rinse  Assessment/ Plan:  66 y.o. 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  Will increase sodium tablets to 1 g 3 times daily.  We will also initiate furosemide 20 mg daily.  Due to lung cancer diagnosis, hyponatremia will be a challenge to manage.  We will continue outpatient follow-up.  Patient cleared to discharge from renal stance.   LOS: [place] 10/11/20231:48 [place], [place]

## 2022-08-12 NOTE — Hospital Course (Signed)
66 year old male with past medical history of lung cancer with chronic hyponatremia from SIADH, COPD, tobacco abuse and hypertension who had been started on sodium chloride tablets for hyponatremia, but continued to feel weak and had poor p.o. intake.  On day prior to admission, sodium found to be 115 and initially was recommended to go to the ER, but he delayed this for day but when feeling worse then came in.  Patient mid to the hospital service and initially started on 3% saline drip with little improvement.  Nephrology consulted.  Then given 12 at 10x3 and restarted on sodium chloride tablets.  Sodium did improve up to as high as 131, but then started to trend back down slightly.  Patient started on p.o. Lasix to help the situation.  Sodium down to 128, but patient is point feeling better and did not want to stay further in the hospital.  Since he was overall stable and much improved from admission, discharged home.

## 2022-08-17 ENCOUNTER — Telehealth: Payer: Self-pay | Admitting: Pulmonary Disease

## 2022-08-17 NOTE — Telephone Encounter (Signed)
Since his last CT was looking better we can see him in 4 to 6 weeks.

## 2022-08-17 NOTE — Telephone Encounter (Signed)
Called and spoke to patient.  He stated 08/10/2022 appt was canceled due to admission. He is questioning if this visit should be rescheduled for not.  Dr. Patsey Berthold, please advise. Thanks

## 2022-08-17 NOTE — Telephone Encounter (Signed)
Patient would like the nurse to call him regarding a missed appt. He had due to the fact he was in the hospital.  He is not sure if he should reschedule or not.  Please call patient to discuss further at [phone]

## 2022-08-17 NOTE — Telephone Encounter (Signed)
Called and spoke to patient. Appt scheduled 09/14/2022 at 2:30. Nothing further needed.

## 2022-08-18 ENCOUNTER — Inpatient Hospital Stay (HOSPITAL_BASED_OUTPATIENT_CLINIC_OR_DEPARTMENT_OTHER): Payer: Medicare Other | Admitting: Hospice and Palliative Medicine

## 2022-08-18 DIAGNOSIS — C3491 Malignant neoplasm of unspecified part of right bronchus or lung: Secondary | ICD-10-CM

## 2022-08-18 MED ORDER — GABAPENTIN 100 MG PO CAPS: 100.0000 mg | ORAL_CAPSULE | Freq: Every day | ORAL | 3 refills | Status: DC

## 2022-08-18 NOTE — Progress Notes (Signed)
Virtual Visit via Telephone Note  I connected with Paul Carpenter on 08/18/22 at  1:40 PM EDT by telephone and verified that I am speaking with the correct person using two identifiers.  Location: Patient: Home Provider: Clinic   I discussed the limitations, risks, security and privacy concerns of performing an evaluation and management service by telephone and the availability of in person appointments. I also discussed with the patient that there may be a patient responsible charge related to this service. The patient expressed understanding and agreed to proceed.   History of Present Illness: Paul Carpenter is a 66 y.o. 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  Patient has chronic Mycobacterium avium infection.    Observations/Objective: I called and spoke with patient.  Patient was hospitalized 08/06/2022 to 08/12/2022 with hyponatremia, which improved on 3% saline.  Patient had follow-up with PCP yesterday with sodium of 127.  TSH was noted to be 15.136 and patient reports that Synthroid dose was increased.  Patient denies significant symptomatic changes but does endorse overall fatigue, which is likely multifactorial.  He reports stable pain.  Appetite remains poor.  Dr. Grayland Ormond had prescribed a trial of Marinol but patient says that he has been unable to get that from the pharmacy.  The pharmacy has apparently ordered the prescription.  Assessment and Plan: Neuropathic pain -Continue gabapentin 100 mg nightly  Appetite - Trial of Marinol   MAC versus NSCLC - patient is on active treatment for MAC infection and sees ID tomorrow.   Case and plan  discussed with Dr. Grayland Ormond  Follow Up Instructions: Follow-up as needed   I discussed the assessment and treatment plan with the patient. The patient was provided an opportunity to ask questions and all were answered. The patient agreed with the plan and demonstrated an understanding of the instructions.   The patient was advised to call back or seek an in-person evaluation if the symptoms worsen or if the condition fails to improve as anticipated.  I provided 15 minutes of non-face-to-face time during this encounter.   Irean Hong, NP

## 2022-08-19 ENCOUNTER — Other Ambulatory Visit
Admission: RE | Admit: 2022-08-19 | Discharge: 2022-08-19 | Disposition: A | Discharge status: Home / Self Care | Payer: Medicare Other | Source: Ambulatory Visit | Attending: Infectious Diseases | Admitting: Infectious Diseases

## 2022-08-19 ENCOUNTER — Other Ambulatory Visit: Payer: Self-pay | Admitting: *Deleted

## 2022-08-19 ENCOUNTER — Other Ambulatory Visit: Payer: Self-pay

## 2022-08-19 ENCOUNTER — Telehealth: Payer: Self-pay | Admitting: *Deleted

## 2022-08-19 ENCOUNTER — Encounter: Payer: Self-pay | Admitting: Infectious Diseases

## 2022-08-19 ENCOUNTER — Ambulatory Visit: Payer: Medicare Other | Attending: Infectious Diseases | Admitting: Infectious Diseases

## 2022-08-19 VITALS — BP 133/82 | HR 96 | Temp 98.6°F | Ht 72.0 in | Wt 173.0 lb

## 2022-08-19 DIAGNOSIS — Z923 Personal history of irradiation: Secondary | ICD-10-CM | POA: Diagnosis not present

## 2022-08-19 DIAGNOSIS — C3490 Malignant neoplasm of unspecified part of unspecified bronchus or lung: Secondary | ICD-10-CM | POA: Diagnosis not present

## 2022-08-19 DIAGNOSIS — F17210 Nicotine dependence, cigarettes, uncomplicated: Secondary | ICD-10-CM | POA: Diagnosis not present

## 2022-08-19 DIAGNOSIS — I110 Hypertensive heart disease with heart failure: Secondary | ICD-10-CM | POA: Insufficient documentation

## 2022-08-19 DIAGNOSIS — Z9221 Personal history of antineoplastic chemotherapy: Secondary | ICD-10-CM | POA: Diagnosis not present

## 2022-08-19 DIAGNOSIS — I509 Heart failure, unspecified: Secondary | ICD-10-CM | POA: Insufficient documentation

## 2022-08-19 DIAGNOSIS — E871 Hypo-osmolality and hyponatremia: Secondary | ICD-10-CM | POA: Insufficient documentation

## 2022-08-19 DIAGNOSIS — R222 Localized swelling, mass and lump, trunk: Secondary | ICD-10-CM | POA: Diagnosis not present

## 2022-08-19 DIAGNOSIS — J4489 Other specified chronic obstructive pulmonary disease: Secondary | ICD-10-CM | POA: Insufficient documentation

## 2022-08-19 DIAGNOSIS — E039 Hypothyroidism, unspecified: Secondary | ICD-10-CM | POA: Diagnosis not present

## 2022-08-19 LAB — CORTISOL: Cortisol, Plasma: 7 ug/dL

## 2022-08-19 MED ORDER — DRONABINOL 5 MG PO CAPS: 5.0000 mg | ORAL_CAPSULE | Freq: Two times a day (BID) | ORAL | 0 refills | Status: DC

## 2022-08-19 NOTE — Telephone Encounter (Signed)
Shirlean Mylar called reporting that [facility] sold out of the 5 mg dronabinol tabs but they have 2,5 mg abs and need new prescription for that. I called [facility] and gave verbal order to change to 2.5 mg twice a day before lunch and before supper disp # 120 tabs I spoke with Adonis Brook the Pharmacist who took the order. Robin than called back and informed of prescription accepted

## 2022-08-19 NOTE — Progress Notes (Signed)
NAME: Paul Carpenter  DOB: 1956/08/03  MRN: [id number]  Date/Time: 08/19/2022 9:43 AM  Subjective:  Follow up visit for MAC- last seen 07/20/22 here with his wife Cavitary lung lesions rt upper lobe being treated for MAC with h/o ca lung On Ethambutol and azithromycin and rifampin  100% adherent  But has poor appetite Lost 20 pounds since May Says he feels like quitting He has not had any alcohol for the past month HE was recently hospitalized for hyponatremia SIADH , now on salt tabs Recent labs normal LFTS/cr  Following taken from last note  Paul Carpenter is a 66 y.o. 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 [name] note he was initially diagnosed with non-small cell carcinoma of the lung in 2019. On 04/11/2018 a bronch with EBUS done and biopsy taken of subcarinal mass- it showed  - POSITIVE FOR MALIGNANT CELLS IN A NECROTIC BACKGROUND.  MRI of the brain completed on Mar 28, 2018 did not reveal any metastatic disease.  He completed radiation June 26, 2018.  He completed concurrent single agent carboplatinum on June 21, 2018.  He had a reaction to Taxol during cycle 1 and this was discontinued.  He completed a year-long maintenance subdural level lab on June 27, 2019.  In July 2022 there was a concern for slight progression of the disease.  PET scan done on 06/03/2021 revealed a spiculated cavitary mass in the apical segment of the right upper lobe with similar SUV which was 3.5 versus 3.31-year ago.  So this was reassuring as per the radiologist.  However disease progression could not be definitely excluded.   no metastasis seen.  A CT scan done on 07/20/2021 showed  continued slow evolution of a thick-walled cavitary mass in the right upper lobe which remains concerning for slow-growing neoplasm.  He had a fall and fractured his C 2 in December , 2022.  He was in [facility] at that time.  CTs chest that was done that showed a large right upper lobe soft tissue mass with similar abnormal tissue involving the right pulmonary hilum extending medially into the ipsilateral mediastinum in keeping with a reported history of primary lung cancer. In January he was hospitalized at [facility] for hyponatremia.  At that time oncologist saw him and got another PET scan.  A bronchoscopy/biopsy could not be done due to C2 fracture.  A PET scan was done on 11/16/2021 and it showed similar size of the cavitary process in the right upper lobe.  Increased size and hypermetabolism compared to the PET on 06/02/2021.  Hypermetabolic pulmonary nodules many of which were new compared to the CAT scan of 10/20/2021.    He was diagnosed to have recurrence and was started on palliative chemo with  taxotere( 4 cycles) on 11/26/21- completed on 01/28/22 He was hospitalized  02/03/22-02/05/22 for sob and cough and fever and sputum sent which was pseudomonas, but realized after his discharge He saw Dr.[name] on 02/09/22 and received a 2 week course of cipro for necrotizing pneumonia with pseudomonas. A repeat PET scan done on 02/23/22 showed Thick-walled cavitary mass in the right upper lobe has decreased in overall hypermetabolism, indicative of treatment response. 2. Multifocal areas of nodular consolidation and peribronchovascular nodularity with  a waxing and waning appearance, suggesting combination slight improvement in metastatic disease with a superimposed atypical/fungal infectious process. 3. Mildly hypermetabolic low right paratracheal and right hilar lymph nodes, metastatic or reactive. 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 and he was referred to me for treatment Not been on any further treatment  for lung cancer Followed by Dr.[name] and Dr.[name]  Past Medical History:  Diagnosis Date   Anginal pain ([place])    Anxiety    Asthma    Chest pain    CHF (congestive heart failure) (HCC)    Chicken pox    Complication of anesthesia    o2 dropped after neck fusion   COPD (chronic obstructive pulmonary disease) ([place])    Coronary artery disease    Cough    chronic  clear phlegm   Dysrhythmia    palpitations   GERD (gastroesophageal reflux disease)    h/o reflux/ hoarsness   Hematochezia    Hemorrhoids    History of chickenpox    History of colon polyps    History of Helicobacter pylori infection    Hoarseness    Hypertension    Lung cancer ([place]) 05/2016   Chemo + rad tx's.    Migraines    OSA (obstructive sleep apnea)    has CPAP but does not use   Personal history of tobacco use, presenting hazards to health 03/05/2016   Pneumonia    5/17   Raynaud disease    Raynaud disease    Raynaud's disease    Rotator cuff tear    on right   Shortness of breath dyspnea    Sleep apnea    Ulcer (traumatic) of oral mucosa     Past Surgical History:  Procedure Laterality Date   BACK SURGERY     cervical fusion x 2   CARDIAC CATHETERIZATION     CERVICAL DISCECTOMY     x 2   COLONOSCOPY     COLONOSCOPY N/A 07/25/2015   Procedure: COLONOSCOPY;  Surgeon: Lollie Sails, MD;  Location: [facility] ENDOSCOPY;  Service: Endoscopy;  Laterality: N/A;   COLONOSCOPY WITH PROPOFOL N/A 10/04/2017   Procedure: COLONOSCOPY WITH PROPOFOL;  Surgeon: Lollie Sails, MD;  Location: [facility] ENDOSCOPY;  Service: Endoscopy;  Laterality: N/A;   COLONOSCOPY WITH PROPOFOL N/A 07/10/2020   Procedure: COLONOSCOPY WITH PROPOFOL;  Surgeon: Toledo, Benay Pike, MD;  Location: [facility] ENDOSCOPY;  Service: Gastroenterology;  Laterality: N/A;   ELECTROMAGNETIC NAVIGATION BROCHOSCOPY Left 06/28/2016   Procedure: ELECTROMAGNETIC NAVIGATION BRONCHOSCOPY;  Surgeon: Vilinda Boehringer, MD;  Location: [facility] ORS;  Service:  Cardiopulmonary;  Laterality: Left;   ENDOBRONCHIAL ULTRASOUND N/A 04/11/2018   Procedure: ENDOBRONCHIAL ULTRASOUND;  Surgeon: [name], MD;  Location: [facility] ORS;  Service: Cardiopulmonary;  Laterality: N/A;   ESOPHAGOGASTRODUODENOSCOPY N/A 07/25/2015   Procedure: ESOPHAGOGASTRODUODENOSCOPY (EGD);  Surgeon: Lollie Sails, MD;  Location: [facility] ENDOSCOPY;  Service: Endoscopy;  Laterality: N/A;   ESOPHAGOGASTRODUODENOSCOPY (EGD) WITH PROPOFOL N/A 07/10/2020   Procedure: ESOPHAGOGASTRODUODENOSCOPY (EGD) WITH PROPOFOL;  Surgeon: Toledo, Benay Pike, MD;  Location: [facility] ENDOSCOPY;  Service: Gastroenterology;  Laterality: N/A;   NASAL SINUS SURGERY     x 2    PORTA CATH INSERTION N/A 04/24/2018   Procedure: PORTA CATH INSERTION;  Surgeon: Algernon Huxley, MD;  Location: [facility];  Service: Cardiovascular;  Laterality: N/A;   rotator cuff surgery Right    07/2016   SEPTOPLASTY     SKIN GRAFT      Social History  Socioeconomic History   Marital status: Married    Spouse name: Not on file   Number of children: 3   Years of education: Not on file   Highest education level: Not on file  Occupational History   Occupation: Architect Work  Tobacco Use   Smoking status: Every Day    Packs/day: 3.00    Years: 50.00    Total pack years: 150.00    Types: Cigarettes   Smokeless tobacco: Never   Tobacco comments:    Smoking less than 2 ppd.  Trying to cut back.  06/29/2022 hfb  Vaping Use   Vaping Use: Never used  Substance and Sexual Activity   Alcohol use: Yes    Alcohol/week: 2.0 standard drinks of alcohol    Types: 2 Standard drinks or equivalent per week    Comment: moderate   Drug use: No   Sexual activity: Not on file  Other Topics Concern   Not on file  Social History Narrative   Not on file   Social Determinants of Health   Financial Resource Strain: Low Risk  (07/22/2022)   Overall Financial Resource Strain (CARDIA)    Difficulty of Paying Living Expenses: Not very  hard  Food Insecurity: No Food Insecurity (07/22/2022)   Hunger Vital Sign    Worried About Running Out of Food in the Last Year: Never true    Ran Out of Food in the Last Year: Never true  Transportation Needs: No Transportation Needs (07/22/2022)   PRAPARE - Hydrologist (Medical): No    Lack of Transportation (Non-Medical): No  Physical Activity: Inactive (07/22/2022)   Exercise Vital Sign    Days of Exercise per Week: 0 days    Minutes of Exercise per Session: 0 min  Stress: Stress Concern Present (07/22/2022)   [place]    Feeling of Stress : Rather much  Social Connections: Moderately Integrated (07/22/2022)   Social Connection and Isolation Panel [NHANES]    Frequency of Communication with Friends and Family: Twice a week    Frequency of Social Gatherings with Friends and Family: Twice a week    Attends Religious Services: 1 to 4 times per year    Active Member of [employer] or Organizations: No    Attends Music therapist: Not on file    Marital Status: Married  Human resources officer Violence: Not At Risk (07/22/2022)   Humiliation, Afraid, Rape, and Kick questionnaire    Fear of Current or Ex-Partner: No    Emotionally Abused: No    Physically Abused: No    Sexually Abused: No    Family History  Problem Relation Age of Onset   Heart disease Father    Prostate cancer Father    Heart disease Paternal Grandmother    Heart attack Maternal Grandfather 63   Kidney cancer Neg Hx    Bladder Cancer Neg Hx    Other Neg Hx        pituitary abnormality   Allergies  Allergen Reactions   Lisinopril Rash   Varenicline Rash   I? Current Outpatient Medications  Medication Sig Dispense Refill   acetaminophen (TYLENOL) 500 MG tablet Take 500 mg by mouth daily as needed.     albuterol (PROAIR HFA) 108 (90 Base) MCG/ACT inhaler Inhale 2 puffs into the lungs every 4 (four) hours as  needed for wheezing or shortness of breath. 18 g 2   albuterol (PROVENTIL) (2.5  MG/3ML) 0.083% nebulizer solution Take 3 mLs (2.5 mg total) by nebulization 4 (four) times daily as needed for wheezing or shortness of breath. 120 mL 2   atorvastatin (LIPITOR) 40 MG tablet Take 40 mg by mouth at bedtime.      azithromycin (ZITHROMAX) 500 MG tablet Take 500 mg by mouth daily.     Budeson-Glycopyrrol-Formoterol (BREZTRI AEROSPHERE) 160-9-4.8 MCG/ACT AERO Inhale 2 puffs into the lungs in the morning and at bedtime. 10.7 g 11   Budeson-Glycopyrrol-Formoterol (BREZTRI AEROSPHERE) 160-9-4.8 MCG/ACT AERO Inhale 2 puffs into the lungs in the morning and at bedtime. 5.9 g 0   diazepam (VALIUM) 5 MG tablet Take 5 mg by mouth at bedtime as needed.     dronabinol (MARINOL) 5 MG capsule Take 1 capsule (5 mg total) by mouth 2 (two) times daily before lunch and supper. 60 capsule 0   ethambutol (MYAMBUTOL) 400 MG tablet Take 3 tablets (1,200 mg total) by mouth daily. 90 tablet 3   ferrous sulfate 325 (65 FE) MG tablet Take 325 mg by mouth at bedtime.     furosemide (LASIX) 20 MG tablet Take 1 tablet (20 mg total) by mouth daily. 30 tablet 1   gabapentin (NEURONTIN) 100 MG capsule Take 1 capsule (100 mg total) by mouth at bedtime. 30 capsule 3   levothyroxine (SYNTHROID) 150 MCG tablet Take 150 mcg by mouth daily before breakfast.     lidocaine-prilocaine (EMLA) cream Apply 1 application topically as needed. 30 g 1   magnesium oxide (MAG-OX) 400 MG tablet Take 600 mg by mouth 2 (two) times daily. Takes 1.5 tablet twice daily     mirtazapine (REMERON) 7.5 MG tablet Take 1 tablet (7.5 mg total) by mouth at bedtime. 30 tablet 2   nitroGLYCERIN (NITROSTAT) 0.4 MG SL tablet Place under the tongue.     OXYGEN Inhale 2 L into the lungs at bedtime.     pantoprazole (PROTONIX) 40 MG tablet Take 1 tablet (40 mg total) by mouth 2 (two) times daily before meals     rifampin (RIFADIN) 300 MG capsule Take 2 capsules (600 mg  total) by mouth daily. 60 capsule 1   sodium chloride 1 g tablet Take 1 tablet (1 g total) by mouth 3 (three) times daily with meals. 90 tablet 1   tadalafil (CIALIS) 5 MG tablet Take 1 tablet (5 mg total) by mouth daily as needed for erectile dysfunction. 90 tablet 3   traMADol (ULTRAM) 50 MG tablet Take 50 mg by mouth every 8 (eight) hours as needed.     No current facility-administered medications for this visit.     Abtx:  Anti-infectives (From admission, onward)    None       REVIEW OF SYSTEMS:  Const: negative fever, negative chills, has 20 pound  weight loss in 6 months Eyes: negative diplopia or visual changes, negative eye pain ENT: negative coryza, negative sore throat Resp: cough, , dyspnea rt sided pleuritic chest pain better than before Cards: negative for chest pain, palpitations, lower extremity edema GU: negative for frequency, dysuria and hematuria GI: poor appetite , weight loss Skin: negative for rash and pruritus Heme: negative for easy bruising and gum/nose bleeding MS: fatigue Neurolo:negative for headaches, dizziness, vertigo, memory problems  Psych:depression  Endocrine: negative for thyroid, diabetes Allergy/Immunology-as above: Continues to smoke 1 1/2 pack-  VITALS:  BP 133/82   Pulse 96   Temp 98.6 F (37 C) (Temporal)   Ht 6' (1.829 m)   Wt 173  lb (78.5 kg)   BMI 23.46 kg/m   PHYSICAL EXAM:  General: Alert, cooperative, no  distress at rest,  Lungs: b/l air entry Rhonchi and crepts on rt side Heart: Regular rate and rhythm, no murmur, rub or gallop. Abdomen: did not examine Extremities: atraumatic, no cyanosis. No edema. No clubbing Skin: No rashes or lesions. Or bruising Lymph: Cervical, supraclavicular normal. Neurologic: Grossly non-focal  Labs    Latest Ref Rng & Units 08/11/2022    1:19 AM 08/10/2022    2:44 AM 08/09/2022    1:33 AM  CBC  WBC 4.0 - 10.5 K/uL 5.7  6.3  6.6   Hemoglobin 13.0 - 17.0 g/dL 13.1  12.8  12.9    Hematocrit 39.0 - 52.0 % 37.7  36.4  35.8   Platelets 150 - 400 K/uL 233  215  225        Latest Ref Rng & Units 08/11/2022    1:36 PM 08/11/2022    9:33 AM 08/11/2022    5:38 AM  CMP  Sodium 135 - 145 mmol/L 128  128  130     s  ? Impression/Recommendation 66 year-old male current smoker and alcohol use with  recurrent non-small cell CA lung on the right side.  In 2019 had undergone chemo, radiation and immunotherapy Since January 2023 he had taken 4 cycles of palliative chemotherapy. No chemo since the past 6 months No biopsy was taken the second time around because of C2 fracture  Neck cavitary/necrotizing mass on the right upper lobe/middle lobe Being treated for MAC - on azithromycin, ethambutol and rifampin  Lack of energy, poor appetite, weight loss- will check cortisol level  Hyperlipidemia on atorvastatin. With rifampin the level can decrease or increase- he has no stents- it may be okay to stop atorvastatin for a few weeks or reduce the dose to 87m  COPD  Hypothyroidism on Synthroid- recently increased   Hyponatremia due to SIADH- recently admitted  for that. On sodium tablets  Poor appetite- trying to fill marinol prescription   follow-up 2 1/2  month.  HE will have labs on 11/3 when he sees his PCP Discussed the management with patient and wife Discussed with pulmonologist and oncologist Follow up with PCP Dr.Hande  __________________________________________________

## 2022-08-19 NOTE — Telephone Encounter (Signed)
Paul Carpenter- looks like Dr. Grayland Ormond prescribed this.

## 2022-08-19 NOTE — Telephone Encounter (Signed)
[facility] COULD NOT GET HIS MARINOL IN STOCK HE HAS CALLED AROUND AND WAS TLD THAT [facility] [address] HAS IT IN STOCK AND IS ASKING THAT PRESCRIPTION BE SENT THERE

## 2022-08-23 ENCOUNTER — Telehealth: Payer: Self-pay

## 2022-08-23 NOTE — Telephone Encounter (Signed)
Called and spoke with patient. Relayed message verbatim from Dr. [name], "the cortisol that was checked was okay- please discuss with his PCP regarding fatigue and weight loss during his next appt  early November".   Patient verbalized understanding and had no additional questions.  Binnie Kand, RN

## 2022-08-23 NOTE — Telephone Encounter (Signed)
-----   Message from Tsosie Billing, MD sent at 08/20/2022 12:25 PM EDT ----- Please let the patient know that the cortisol that was checked yesterday was okay- he can discuss with his PCP regarding fatigue and weight loss during his next appt  early Nov. thx ----- Message ----- From: Buel Ream, [facility] [place] Sent: 08/19/2022   2:42 PM EDT To: Tsosie Billing, MD

## 2022-08-24 ENCOUNTER — Other Ambulatory Visit
Admission: RE | Admit: 2022-08-24 | Discharge: 2022-08-24 | Disposition: A | Discharge status: Home / Self Care | Payer: Medicare Other | Attending: Nephrology | Admitting: Nephrology

## 2022-08-24 DIAGNOSIS — E871 Hypo-osmolality and hyponatremia: Secondary | ICD-10-CM | POA: Diagnosis present

## 2022-08-24 LAB — BASIC METABOLIC PANEL
Anion gap: 8 (ref 5–15)
BUN: 9 mg/dL (ref 8–23)
CO2: 26 mmol/L (ref 22–32)
Calcium: 8.8 mg/dL — ABNORMAL LOW (ref 8.9–10.3)
Chloride: 93 mmol/L — ABNORMAL LOW (ref 98–111)
Creatinine, Ser: 0.54 mg/dL — ABNORMAL LOW (ref 0.61–1.24)
GFR, Estimated: >60 mL/min (ref >60)
Glucose, Bld: 67 mg/dL — ABNORMAL LOW (ref 70–99)
Potassium: 4.2 mmol/L (ref 3.5–5.1)
Sodium: 127 mmol/L — ABNORMAL LOW (ref 135–145)

## 2022-08-27 LAB — ACID FAST SMEAR (AFB, MYCOBACTERIA): Acid Fast Smear: NEGATIVE

## 2022-08-29 ENCOUNTER — Other Ambulatory Visit: Payer: Self-pay | Admitting: Pulmonary Disease

## 2022-09-10 ENCOUNTER — Inpatient Hospital Stay
Admission: EM | Admit: 2022-09-10 | Discharge: 2022-09-13 | DRG: 644 | Disposition: A | Discharge status: Home / Self Care | Payer: Medicare Other | Attending: Obstetrics and Gynecology | Admitting: Obstetrics and Gynecology

## 2022-09-10 ENCOUNTER — Other Ambulatory Visit: Payer: Self-pay

## 2022-09-10 DIAGNOSIS — F32A Depression, unspecified: Secondary | ICD-10-CM | POA: Diagnosis present

## 2022-09-10 DIAGNOSIS — E222 Syndrome of inappropriate secretion of antidiuretic hormone: Principal | ICD-10-CM | POA: Diagnosis present

## 2022-09-10 DIAGNOSIS — R531 Weakness: Secondary | ICD-10-CM

## 2022-09-10 DIAGNOSIS — E871 Hypo-osmolality and hyponatremia: Secondary | ICD-10-CM | POA: Diagnosis not present

## 2022-09-10 DIAGNOSIS — E785 Hyperlipidemia, unspecified: Secondary | ICD-10-CM | POA: Diagnosis present

## 2022-09-10 DIAGNOSIS — I110 Hypertensive heart disease with heart failure: Secondary | ICD-10-CM | POA: Diagnosis present

## 2022-09-10 DIAGNOSIS — Z79899 Other long term (current) drug therapy: Secondary | ICD-10-CM

## 2022-09-10 DIAGNOSIS — I10 Essential (primary) hypertension: Secondary | ICD-10-CM | POA: Diagnosis present

## 2022-09-10 DIAGNOSIS — F17210 Nicotine dependence, cigarettes, uncomplicated: Secondary | ICD-10-CM | POA: Diagnosis present

## 2022-09-10 DIAGNOSIS — Z9221 Personal history of antineoplastic chemotherapy: Secondary | ICD-10-CM

## 2022-09-10 DIAGNOSIS — Z981 Arthrodesis status: Secondary | ICD-10-CM

## 2022-09-10 DIAGNOSIS — Z923 Personal history of irradiation: Secondary | ICD-10-CM

## 2022-09-10 DIAGNOSIS — I5020 Unspecified systolic (congestive) heart failure: Secondary | ICD-10-CM | POA: Diagnosis present

## 2022-09-10 DIAGNOSIS — I5032 Chronic diastolic (congestive) heart failure: Secondary | ICD-10-CM | POA: Diagnosis present

## 2022-09-10 DIAGNOSIS — Z85118 Personal history of other malignant neoplasm of bronchus and lung: Secondary | ICD-10-CM

## 2022-09-10 DIAGNOSIS — J449 Chronic obstructive pulmonary disease, unspecified: Secondary | ICD-10-CM | POA: Diagnosis present

## 2022-09-10 DIAGNOSIS — F419 Anxiety disorder, unspecified: Secondary | ICD-10-CM | POA: Diagnosis present

## 2022-09-10 DIAGNOSIS — Z79890 Hormone replacement therapy (postmenopausal): Secondary | ICD-10-CM

## 2022-09-10 DIAGNOSIS — J4489 Other specified chronic obstructive pulmonary disease: Secondary | ICD-10-CM | POA: Diagnosis present

## 2022-09-10 DIAGNOSIS — I2510 Atherosclerotic heart disease of native coronary artery without angina pectoris: Secondary | ICD-10-CM | POA: Diagnosis present

## 2022-09-10 DIAGNOSIS — K219 Gastro-esophageal reflux disease without esophagitis: Secondary | ICD-10-CM | POA: Diagnosis present

## 2022-09-10 DIAGNOSIS — F17200 Nicotine dependence, unspecified, uncomplicated: Secondary | ICD-10-CM | POA: Diagnosis present

## 2022-09-10 DIAGNOSIS — E039 Hypothyroidism, unspecified: Secondary | ICD-10-CM | POA: Diagnosis present

## 2022-09-10 DIAGNOSIS — Z9981 Dependence on supplemental oxygen: Secondary | ICD-10-CM

## 2022-09-10 LAB — COMPREHENSIVE METABOLIC PANEL
ALT: 10 U/L (ref 0–44)
AST: 14 U/L — ABNORMAL LOW (ref 15–41)
Albumin: 3.7 g/dL (ref 3.5–5.0)
Alkaline Phosphatase: 79 U/L (ref 38–126)
Anion gap: 6 (ref 5–15)
BUN: 7 mg/dL — ABNORMAL LOW (ref 8–23)
CO2: 27 mmol/L (ref 22–32)
Calcium: 8.9 mg/dL (ref 8.9–10.3)
Chloride: 86 mmol/L — ABNORMAL LOW (ref 98–111)
Creatinine, Ser: 0.49 mg/dL — ABNORMAL LOW (ref 0.61–1.24)
GFR, Estimated: >60 mL/min (ref >60)
Glucose, Bld: 106 mg/dL — ABNORMAL HIGH (ref 70–99)
Potassium: 4.7 mmol/L (ref 3.5–5.1)
Sodium: 119 mmol/L — CL (ref 135–145)
Total Bilirubin: 0.4 mg/dL (ref 0.3–1.2)
Total Protein: 6.8 g/dL (ref 6.5–8.1)

## 2022-09-10 LAB — CBC WITH DIFFERENTIAL/PLATELET
Abs Immature Granulocytes: 0.01 10*3/uL (ref 0.00–0.07)
Basophils Absolute: 0.1 10*3/uL (ref 0.0–0.1)
Basophils Relative: 1 %
Eosinophils Absolute: 0.1 10*3/uL (ref 0.0–0.5)
Eosinophils Relative: 1 %
HCT: 33.8 % — ABNORMAL LOW (ref 39.0–52.0)
Hemoglobin: 12.2 g/dL — ABNORMAL LOW (ref 13.0–17.0)
Immature Granulocytes: 0 %
Lymphocytes Relative: 8 %
Lymphs Abs: 0.5 10*3/uL — ABNORMAL LOW (ref 0.7–4.0)
MCH: 31.6 pg (ref 26.0–34.0)
MCHC: 36.1 g/dL — ABNORMAL HIGH (ref 30.0–36.0)
MCV: 87.6 fL (ref 80.0–100.0)
Monocytes Absolute: 0.5 10*3/uL (ref 0.1–1.0)
Monocytes Relative: 9 %
Neutro Abs: 4.7 10*3/uL (ref 1.7–7.7)
Neutrophils Relative %: 81 %
Platelets: 193 10*3/uL (ref 150–400)
RBC: 3.86 MIL/uL — ABNORMAL LOW (ref 4.22–5.81)
RDW: 11.9 % (ref 11.5–15.5)
WBC: 5.8 10*3/uL (ref 4.0–10.5)
nRBC: 0 % (ref 0.0–0.2)

## 2022-09-10 LAB — SODIUM: Sodium: 118 mmol/L — CL (ref 135–145)

## 2022-09-10 LAB — TSH: TSH: 18.827 u[IU]/mL — ABNORMAL HIGH (ref 0.350–4.500)

## 2022-09-10 LAB — T4, FREE: Free T4: 0.76 ng/dL (ref 0.61–1.12)

## 2022-09-10 MED ORDER — UREA 15 G PO PACK
15.0000 g | PACK | Freq: Two times a day (BID) | ORAL | Status: DC
  Administered 2022-09-10 – 2022-09-11 (×2): 15 g via ORAL
  Filled 2022-09-10 (×2): qty 1

## 2022-09-10 MED ORDER — SODIUM CHLORIDE 0.9 % IV SOLN: INTRAVENOUS | Status: DC

## 2022-09-10 MED ORDER — HYDROCODONE-ACETAMINOPHEN 5-325 MG PO TABS: 1.0000 | ORAL_TABLET | ORAL | Status: DC | PRN

## 2022-09-10 MED ORDER — ACETAMINOPHEN 325 MG PO TABS: 650.0000 mg | ORAL_TABLET | Freq: Four times a day (QID) | ORAL | Status: DC | PRN

## 2022-09-10 MED ORDER — SODIUM CHLORIDE 0.9 % IV BOLUS
1000.0000 mL | Freq: Once | INTRAVENOUS | Status: AC
  Administered 2022-09-10: 1000 mL via INTRAVENOUS

## 2022-09-10 MED ORDER — HEPARIN SODIUM (PORCINE) 5000 UNIT/ML IJ SOLN
5000.0000 [IU] | Freq: Two times a day (BID) | INTRAMUSCULAR | Status: DC
  Administered 2022-09-10 – 2022-09-11 (×2): 5000 [IU] via SUBCUTANEOUS
  Filled 2022-09-10 (×2): qty 1

## 2022-09-10 MED ORDER — MORPHINE SULFATE (PF) 2 MG/ML IV SOLN: 2.0000 mg | INTRAVENOUS | Status: DC | PRN

## 2022-09-10 MED ORDER — NICOTINE 21 MG/24HR TD PT24
21.0000 mg | MEDICATED_PATCH | Freq: Every day | TRANSDERMAL | Status: DC
  Administered 2022-09-10 – 2022-09-13 (×4): 21 mg via TRANSDERMAL
  Filled 2022-09-10 (×4): qty 1

## 2022-09-10 MED ORDER — ACETAMINOPHEN 650 MG RE SUPP: 650.0000 mg | Freq: Four times a day (QID) | RECTAL | Status: DC | PRN

## 2022-09-10 MED ORDER — SODIUM CHLORIDE 0.9% FLUSH
3.0000 mL | Freq: Two times a day (BID) | INTRAVENOUS | Status: DC
  Administered 2022-09-11 – 2022-09-13 (×2): 3 mL via INTRAVENOUS

## 2022-09-10 NOTE — Assessment & Plan Note (Signed)
Cont levothyroxine at correct dose once med rec is done.  Ft4/ tsh.

## 2022-09-10 NOTE — Progress Notes (Signed)
MEDICATION RELATED CONSULT NOTE  Per MD request review for SIADH / hyponatremia causing meds  ASSESSMENT: After review of med list: Outside of Lasix induced hyponatremia given patients fluid balance, the highest risk med for chronic hyponatremia or SIADH on his home list is his mirtazapine. The onset is usually around 30 days and has no established dose-dependence linked to this effect so it seems reasonable that even at this low dose it may be exacerbating the underlying SIADH 2/2 COPD & lung cancer.   Either TCA or bupropion have the least association with hyponatremia if an alternative is needed. (SSRIs and venlafaxine being the worst offenders with strongest evidence against their use in SIADH).  PLAN: After d/w MD, planning Urea 15g BID x2 doses (re-eval in AM of 11/11) and then q8h sodium monitoring x3   After assessing response to Urea & lasix tomorrow, maybe we could consider 3% NaCL at 30 cc per hour.  Pt is on a very low mirtazapine dose of 7.5mg . From that perspective tapering would not be necessary. Would recommend to hold and consider options of bupropion or amitriptyline after further discussion with the patient.   Pharmacy will continue to monitor peripherally, but feel free to consult for further guidance or monitoring as further drug therapy is selected. Thank you.  Lorna Dibble, PharmD, [facility] Clinical Pharmacist 09/10/2022 9:27 PM

## 2022-09-10 NOTE — H&P (Signed)
History and Physical    Chief Complaint: Hyponatremia   HISTORY OF PRESENT ILLNESS: Paul Carpenter is an 66 y.o. male seen today for hyponatremia secondary to SIADH.  Patient is followed by nephrology Dr. Candiss Norse in the outpatient setting for his hyponatremia and is on salt tablets, fluid restriction and Lasix.  Patient has had hyponatremia chronically since 2017 per our routine blood work records.  Patient was admitted last month with similar complaints of generalized weakness where patient was started on 3% hypertonic saline and admitted to stepdown unit.  Pt has PMH as below: Past Medical History:  Diagnosis Date   Anginal pain ([place])    Anxiety    Asthma    Chest pain    CHF (congestive heart failure) (HCC)    Chicken pox    Complication of anesthesia    o2 dropped after neck fusion   COPD (chronic obstructive pulmonary disease) (HCC)    Coronary artery disease    Cough    chronic  clear phlegm   Dysrhythmia    palpitations   GERD (gastroesophageal reflux disease)    h/o reflux/ hoarsness   Hematochezia    Hemorrhoids    History of chickenpox    History of colon polyps    History of Helicobacter pylori infection    Hoarseness    Hypertension    Lung cancer ([place]) 05/2016   Chemo + rad tx's.    Migraines    OSA (obstructive sleep apnea)    has CPAP but does not use   Personal history of tobacco use, presenting hazards to health 03/05/2016   Pneumonia    5/17   Raynaud disease    Raynaud disease    Raynaud's disease    Rotator cuff tear    on right   Shortness of breath dyspnea    Sleep apnea    Ulcer (traumatic) of oral mucosa      Review of Systems  Constitutional:  Positive for fatigue.  Neurological:  Positive for dizziness, weakness and light-headedness.  All other systems reviewed and are negative.     Allergies  Allergen Reactions   Lisinopril Rash   Varenicline Rash     Past Surgical History:  Procedure Laterality Date   BACK SURGERY      cervical fusion x 2   CARDIAC CATHETERIZATION     CERVICAL DISCECTOMY     x 2   COLONOSCOPY     COLONOSCOPY N/A 07/25/2015   Procedure: COLONOSCOPY;  Surgeon: Lollie Sails, MD;  Location: [facility] ENDOSCOPY;  Service: Endoscopy;  Laterality: N/A;   COLONOSCOPY WITH PROPOFOL N/A 10/04/2017   Procedure: COLONOSCOPY WITH PROPOFOL;  Surgeon: Lollie Sails, MD;  Location: [facility] ENDOSCOPY;  Service: Endoscopy;  Laterality: N/A;   COLONOSCOPY WITH PROPOFOL N/A 07/10/2020   Procedure: COLONOSCOPY WITH PROPOFOL;  Surgeon: Toledo, Benay Pike, MD;  Location: [facility] ENDOSCOPY;  Service: Gastroenterology;  Laterality: N/A;   ELECTROMAGNETIC NAVIGATION BROCHOSCOPY Left 06/28/2016   Procedure: ELECTROMAGNETIC NAVIGATION BRONCHOSCOPY;  Surgeon: Vilinda Boehringer, MD;  Location: [facility] ORS;  Service: Cardiopulmonary;  Laterality: Left;   ENDOBRONCHIAL ULTRASOUND N/A 04/11/2018   Procedure: ENDOBRONCHIAL ULTRASOUND;  Surgeon: [name], MD;  Location: [facility] ORS;  Service: Cardiopulmonary;  Laterality: N/A;   ESOPHAGOGASTRODUODENOSCOPY N/A 07/25/2015   Procedure: ESOPHAGOGASTRODUODENOSCOPY (EGD);  Surgeon: Lollie Sails, MD;  Location: [facility] ENDOSCOPY;  Service: Endoscopy;  Laterality: N/A;   ESOPHAGOGASTRODUODENOSCOPY (EGD) WITH PROPOFOL N/A 07/10/2020   Procedure: ESOPHAGOGASTRODUODENOSCOPY (EGD) WITH PROPOFOL;  Surgeon: Corning, [place]  K, MD;  Location: [facility] ENDOSCOPY;  Service: Gastroenterology;  Laterality: N/A;   NASAL SINUS SURGERY     x 2    PORTA CATH INSERTION N/A 04/24/2018   Procedure: PORTA CATH INSERTION;  Surgeon: Algernon Huxley, MD;  Location: [place] [facility] CV LAB;  Service: Cardiovascular;  Laterality: N/A;   rotator cuff surgery Right    07/2016   SEPTOPLASTY     SKIN GRAFT        Social History   Socioeconomic History   Marital status: Married    Spouse name: Not on file   Number of children: 3   Years of education: Not on file   Highest education level: Not on file  Occupational History    Occupation: Architect Work  Tobacco Use   Smoking status: Every Day    Packs/day: 3.00    Years: 50.00    Total pack years: 150.00    Types: Cigarettes   Smokeless tobacco: Never   Tobacco comments:    Smoking less than 2 ppd.  Trying to cut back.  06/29/2022 hfb  Vaping Use   Vaping Use: Never used  Substance and Sexual Activity   Alcohol use: Yes    Alcohol/week: 2.0 standard drinks of alcohol    Types: 2 Standard drinks or equivalent per week    Comment: moderate   Drug use: No   Sexual activity: Not on file  Other Topics Concern   Not on file  Social History Narrative   Not on file   Social Determinants of Health   Financial Resource Strain: Low Risk  (07/22/2022)   Overall Financial Resource Strain (CARDIA)    Difficulty of Paying Living Expenses: Not very hard  Food Insecurity: No Food Insecurity (07/22/2022)   Hunger Vital Sign    Worried About Running Out of Food in the Last Year: Never true    Ran Out of Food in the Last Year: Never true  Transportation Needs: No Transportation Needs (07/22/2022)   PRAPARE - Hydrologist (Medical): No    Lack of Transportation (Non-Medical): No  Physical Activity: Inactive (07/22/2022)   Exercise Vital Sign    Days of Exercise per Week: 0 days    Minutes of Exercise per Session: 0 min  Stress: Stress Concern Present (07/22/2022)   [place]    Feeling of Stress : Rather much  Social Connections: Moderately Integrated (07/22/2022)   Social Connection and Isolation Panel [NHANES]    Frequency of Communication with Friends and Family: Twice a week    Frequency of Social Gatherings with Friends and Family: Twice a week    Attends Religious Services: 1 to 4 times per year    Active Member of [employer] or Organizations: No    Attends Music therapist: Not on file    Marital Status: Married      CURRENT MEDS:   Current  Outpatient Medications (Endocrine & Metabolic):    levothyroxine (SYNTHROID) 200 MCG tablet, Take 200 mcg by mouth daily before breakfast.   levothyroxine (SYNTHROID) 150 MCG tablet, Take 150 mcg by mouth daily before breakfast.   Current Outpatient Medications (Cardiovascular):    atorvastatin (LIPITOR) 10 MG tablet, Take 10 mg by mouth daily.   atorvastatin (LIPITOR) 40 MG tablet, Take 40 mg by mouth at bedtime.    furosemide (LASIX) 20 MG tablet, Take 1 tablet (20 mg total) by mouth daily.   nitroGLYCERIN (NITROSTAT)  0.4 MG SL tablet, Place under the tongue.   tadalafil (CIALIS) 5 MG tablet, Take 1 tablet (5 mg total) by mouth daily as needed for erectile dysfunction.   Current Outpatient Medications (Respiratory):    albuterol (PROVENTIL) (2.5 MG/3ML) 0.083% nebulizer solution, Take 3 mLs (2.5 mg total) by nebulization 4 (four) times daily as needed for wheezing or shortness of breath.   albuterol (VENTOLIN HFA) 108 (90 Base) MCG/ACT inhaler, INHALE 2 PUFFS INTO THE LUNGS EVERY 4 HOURS AS NEEDED FOR WHEEZING OR SHORTNESS OF BREATH.   Budeson-Glycopyrrol-Formoterol (BREZTRI AEROSPHERE) 160-9-4.8 MCG/ACT AERO, Inhale 2 puffs into the lungs in the morning and at bedtime.   Budeson-Glycopyrrol-Formoterol (BREZTRI AEROSPHERE) 160-9-4.8 MCG/ACT AERO, Inhale 2 puffs into the lungs in the morning and at bedtime.   Current Outpatient Medications (Analgesics):    acetaminophen (TYLENOL) 500 MG tablet, Take 500 mg by mouth daily as needed.   traMADol (ULTRAM) 50 MG tablet, Take 50 mg by mouth every 8 (eight) hours as needed.   Current Outpatient Medications (Hematological):    ferrous sulfate 325 (65 FE) MG tablet, Take 325 mg by mouth at bedtime.  Current Facility-Administered Medications (Other):    urea (URE-NA) oral packet 15 g  Current Outpatient Medications (Other):    azithromycin (ZITHROMAX) 500 MG tablet, Take 500 mg by mouth daily.   diazepam (VALIUM) 5 MG tablet, Take 5 mg by  mouth at bedtime as needed.   dronabinol (MARINOL) 5 MG capsule, Take 1 capsule (5 mg total) by mouth 2 (two) times daily before lunch and supper.   ethambutol (MYAMBUTOL) 400 MG tablet, Take 3 tablets (1,200 mg total) by mouth daily.   gabapentin (NEURONTIN) 100 MG capsule, Take 1 capsule (100 mg total) by mouth at bedtime.   lidocaine-prilocaine (EMLA) cream, Apply 1 application topically as needed.   magnesium oxide (MAG-OX) 400 MG tablet, Take 600 mg by mouth 2 (two) times daily. Takes 1.5 tablet twice daily   mirtazapine (REMERON) 7.5 MG tablet, Take 1 tablet (7.5 mg total) by mouth at bedtime.   OXYGEN, Inhale 2 L into the lungs at bedtime.   pantoprazole (PROTONIX) 40 MG tablet, Take 1 tablet (40 mg total) by mouth 2 (two) times daily before meals   rifampin (RIFADIN) 300 MG capsule, Take 2 capsules (600 mg total) by mouth daily.   sodium chloride 1 g tablet, Take 1 tablet (1 g total) by mouth 3 (three) times daily with meals.    ED Course: Pt in Ed pt is alert/awake and oriented and gives history.  Vitals:   09/10/22 1825 09/10/22 1830 09/10/22 1845 09/10/22 1900  BP: 112/78 129/72  120/77  Pulse: 80 77 73 73  Resp: (!) 24 (!) 29 19 (!) 26  Temp:      TempSrc:      SpO2: 98% 98% 98% 98%   Total I/O In: 799.2 [IV Piggyback:799.2] Out: -  SpO2: 98 % Blood work in ed shows mild anemia hb at 12.2 and na 119.  Results for orders placed or performed during the hospital encounter of 09/10/22 (from the past 48 hour(s))  CBC with Differential     Status: Abnormal   Collection Time: 09/10/22  5:59 PM  Result Value Ref Range   WBC 5.8 4.0 - 10.5 K/uL   RBC 3.86 (L) 4.22 - 5.81 MIL/uL   Hemoglobin 12.2 (L) 13.0 - 17.0 g/dL   HCT 33.8 (L) 39.0 - 52.0 %   MCV 87.6 80.0 - 100.0 fL  MCH 31.6 26.0 - 34.0 pg   MCHC 36.1 (H) 30.0 - 36.0 g/dL   RDW 11.9 11.5 - 15.5 %   Platelets 193 150 - 400 K/uL   nRBC 0.0 0.0 - 0.2 %   Neutrophils Relative % 81 %   Neutro Abs 4.7 1.7 - 7.7 K/uL    Lymphocytes Relative 8 %   Lymphs Abs 0.5 (L) 0.7 - 4.0 K/uL   Monocytes Relative 9 %   Monocytes Absolute 0.5 0.1 - 1.0 K/uL   Eosinophils Relative 1 %   Eosinophils Absolute 0.1 0.0 - 0.5 K/uL   Basophils Relative 1 %   Basophils Absolute 0.1 0.0 - 0.1 K/uL   Immature Granulocytes 0 %   Abs Immature Granulocytes 0.01 0.00 - 0.07 K/uL    Comment: Performed at [facility], [address] [place], [place] [address]  Comprehensive metabolic panel     Status: Abnormal   Collection Time: 09/10/22  5:59 PM  Result Value Ref Range   Sodium 119 (LL) 135 - 145 mmol/L    Comment: CRITICAL RESULT CALLED TO, READ BACK BY AND VERIFIED WITH  LORANA GABORIAULT ON 09/10/22  AT 1839 RP    Potassium 4.7 3.5 - 5.1 mmol/L   Chloride 86 (L) 98 - 111 mmol/L   CO2 27 22 - 32 mmol/L   Glucose, Bld 106 (H) 70 - 99 mg/dL    Comment: Glucose reference range applies only to samples taken after fasting for at least 8 hours.   BUN 7 (L) 8 - 23 mg/dL   Creatinine, Ser 0.49 (L) 0.61 - 1.24 mg/dL   Calcium 8.9 8.9 - 10.3 mg/dL   Total Protein 6.8 6.5 - 8.1 g/dL   Albumin 3.7 3.5 - 5.0 g/dL   AST 14 (L) 15 - 41 U/L   ALT 10 0 - 44 U/L   Alkaline Phosphatase 79 38 - 126 U/L   Total Bilirubin 0.4 0.3 - 1.2 mg/dL   GFR, Estimated >60 >60 mL/min    Comment: (NOTE) Calculated using the CKD-EPI Creatinine Equation (2021)    Anion gap 6 5 - 15    Comment: Performed at [facility], [address] [place], [place] [address]    In Ed pt received  Meds ordered this encounter  Medications   sodium chloride 0.9 % bolus 1,000 mL   urea (URE-NA) oral packet 15 g    Unresulted Labs (From admission, onward)     Start     Ordered   09/10/22 2005  Sodium  Now then every 4 hours,   STAT      09/10/22 2004   09/10/22 1938  T4, free  Add-on,   AD        09/10/22 1938   09/10/22 1938  TSH  Add-on,   AD        09/10/22 1938   Signed and Held  CBC  (heparin)  Once,   R       Comments: Baseline  for heparin therapy IF NOT ALREADY DRAWN.  Notify MD if PLT < 100 K.    Signed and Held   Signed and Held  Comprehensive metabolic panel  Tomorrow morning,   R        Signed and Held   Signed and Held  CBC  Tomorrow morning,   R        Signed and Held             Admission Imaging :  No results found.    Physical Examination: Vitals:   09/10/22 1825 09/10/22 1830 09/10/22 1845 09/10/22 1900  BP: 112/78 129/72  120/77  Pulse: 80 77 73 73  Temp:      Resp: (!) 24 (!) 29 19 (!) 26  SpO2: 98% 98% 98% 98%  TempSrc:       Physical Exam Vitals and nursing note reviewed.  Constitutional:      General: He is not in acute distress.    Appearance: He is not ill-appearing, toxic-appearing or diaphoretic.  HENT:     Head: Normocephalic and atraumatic.     Right Ear: Hearing and external ear normal.     Left Ear: Hearing and external ear normal.     Nose: Nose normal. No nasal deformity.     Mouth/Throat:     Lips: Pink.     Mouth: Mucous membranes are moist.     Tongue: No lesions.     Pharynx: Oropharynx is clear.  Eyes:     Extraocular Movements: Extraocular movements intact.  Cardiovascular:     Rate and Rhythm: Normal rate and regular rhythm.     Pulses: Normal pulses.     Heart sounds: Normal heart sounds.  Pulmonary:     Effort: Pulmonary effort is normal.     Breath sounds: Normal breath sounds.  Abdominal:     General: Bowel sounds are normal. There is no distension.     Palpations: Abdomen is soft. There is no mass.     Tenderness: There is no abdominal tenderness. There is no guarding.     Hernia: No hernia is present.  Musculoskeletal:     Right lower leg: No edema.     Left lower leg: No edema.  Skin:    General: Skin is warm.  Neurological:     General: No focal deficit present.     Mental Status: He is alert and oriented to person, place, and time.     Cranial Nerves: Cranial nerves 2-12 are intact.     Motor: Motor function is intact.   Psychiatric:        Attention and Perception: Attention normal.        Mood and Affect: Mood normal.        Speech: Speech normal.        Behavior: Behavior normal. Behavior is cooperative.        Cognition and Memory: Cognition normal.        Assessment and Plan: SIADH (syndrome of inappropriate ADH production) ([place]) Pt found to have hyponatremia with sodium of 119. Only neurological symptoms he has is dizziness , but does have some generalized weakness. We will cont pt on lasix and salt tablet and urea packets. Cont ns at 20 cc/hour.  Trial of 7.5 mg of tolvaptan or hyperteonic saline tomorrow if regimen in effective.  Will hold mirtazapine.   COPD (chronic obstructive pulmonary disease) (HCC) Pt to cont duoneb and PRN MDI.   Benign essential hypertension Vitals:   09/10/22 1754 09/10/22 1825 09/10/22 1830 09/10/22 1900  BP: 128/78 112/78 129/72 120/77  Stable cont lasix.     Cigarette smoker Nicotine patch.   Hypothyroidism, acquired Cont levothyroxine at correct dose once med rec is done.  Ft4/ tsh.    HFrEF (heart failure with reduced ejection fraction) (HCC) Compensated and euvolemic.  Cont lasix at 20 mg po daily.      DVT prophylaxis:  SCDs/ Heparin.   Code Status:  Full code  Family Communication:  Brayan, [place] (Spouse) [phone]    Disposition Plan:  Home  Consults called:  Nephrology  Admission status: Observation  Unit/ Expected LOS: Progressive/2 days   Para Skeans MD [facility]  6 PM- 2 AM. Please contact me via secure Chat 6 PM-2 AM. [phone] ( Pager ) To contact the [facility] Attending or Consulting provider [place] or covering provider during after hours [place], for this patient.   Check the care team in [facility] and look for a) attending/consulting [facility] provider listed and b) the [facility] team listed Log into [email or web address] and use [place]'s universal password to access. If you do not have the password, please contact  the hospital operator. Locate the [facility] provider you are looking for under [facility] and page to a number that you can be directly reached. If you still have difficulty reaching the provider, please page the [facility] (Director on Call) for the Hospitalists listed on amion for assistance. [email or web address] 09/10/2022, 8:37 PM

## 2022-09-10 NOTE — Assessment & Plan Note (Signed)
Vitals:   09/10/22 1754 09/10/22 1825 09/10/22 1830 09/10/22 1900  BP: 128/78 112/78 129/72 120/77  Stable cont lasix.

## 2022-09-10 NOTE — Assessment & Plan Note (Addendum)
Pt found to have hyponatremia with sodium of 119. Only neurological symptoms he has is dizziness , but does have some generalized weakness. We will cont pt on lasix and salt tablet and urea packets. Cont ns at 20 cc/hour.  Trial of 7.5 mg of tolvaptan or hyperteonic saline tomorrow if regimen in effective.  Will hold mirtazapine.

## 2022-09-10 NOTE — Assessment & Plan Note (Signed)
Pt to cont duoneb and PRN MDI.

## 2022-09-10 NOTE — ED Provider Notes (Signed)
[facility] Provider Note    Event Date/Time   First MD Initiated Contact with Patient 09/10/22 1816     (approximate)   History   Abnormal Lab and Weakness   HPI  Paul Carpenter is a 66 y.o. male  who presents to the emergency department today because of concern for low sodium level. The patient did have hospitalization last month for hyponatremia thought secondary to SIADH. Patient's sodium level has been slowly declining since then. The patient has been feeling weak and fatigued. No fevers, shortness of breath or chest pain.    Physical Exam   Triage Vital Signs: ED Triage Vitals  Enc Vitals Group     BP 09/10/22 1754 128/78     Pulse Rate 09/10/22 1754 87     Resp 09/10/22 1754 19     Temp 09/10/22 1754 97.8 F (36.6 C)     Temp Source 09/10/22 1754 Axillary     SpO2 --      Weight --      Height --      Head Circumference --      Peak Flow --      Pain Score 09/10/22 1755 0     Pain Loc --      Pain Edu? --      Excl. in [place]? --     Most recent vital signs: Vitals:   09/10/22 1754  BP: 128/78  Pulse: 87  Resp: 19  Temp: 97.8 F (36.6 C)   General: Awake, alert, oriented.  CV:  Good peripheral perfusion. Regular rate and rhythm. Resp:  Normal effort. Lungs clear. Abd:  No distention. Non tender.   ED Results / Procedures / Treatments   Labs (all labs ordered are listed, but only abnormal results are displayed) Labs Reviewed  CBC WITH DIFFERENTIAL/PLATELET - Abnormal; Notable for the following components:      Result Value   RBC 3.86 (*)    Hemoglobin 12.2 (*)    HCT 33.8 (*)    MCHC 36.1 (*)    Lymphs Abs 0.5 (*)    All other components within normal limits  COMPREHENSIVE METABOLIC PANEL - Abnormal; Notable for the following components:   Sodium 119 (*)    Chloride 86 (*)    Glucose, Bld 106 (*)    BUN 7 (*)    Creatinine, Ser 0.49 (*)    AST 14 (*)    All other components within normal limits     EKG  I,  Nance Pear, attending physician, personally viewed and interpreted this EKG  EKG Time: 1816 Rate: 78 Rhythm: sinus rhythm Axis: normal Intervals: qtc 471 QRS: narrow ST changes: no st elevation Impression: abnormal ekg    RADIOLOGY None   PROCEDURES:  Critical Care performed: No  Procedures   MEDICATIONS ORDERED IN ED: Medications - No data to display   IMPRESSION / MDM / [place] / ED COURSE  I reviewed the triage vital signs and the nursing notes.                              Differential diagnosis includes, but is not limited to, SIADH, poor intake.   Patient's presentation is most consistent with acute presentation with potential threat to life or bodily function.  Patient presented to the emergency department today at the advice of primary care after being found to be hyponatremic.  Patient did  have recent hospitalization for hyponatremia.  Blood work here shows a sodium of 119.  Patient is mentally appropriate.  At this time I do not feel patient requires hypertonic saline.  We will start IV fluids.  Discussed with Dr. Posey Pronto with the hospitalist service will plan on admission.  FINAL CLINICAL IMPRESSION(S) / ED DIAGNOSES   Final diagnoses:  Hyponatremia   Note:  This document was prepared using Dragon voice recognition software and may include unintentional dictation errors.    Nance Pear, MD 09/10/22 2127

## 2022-09-10 NOTE — ED Triage Notes (Signed)
Pt presents to ED with c/o of being sent by PCP for admission due to Na+ being 116. Pt states he spent 5 days in the ICU for 5 days 1 month ago.   Pt does c/o of weakness.

## 2022-09-10 NOTE — Hospital Course (Signed)
ADMISSION REQUEST: Hyponatremia. Has h/o lung cancer.  Hypertonic saline.  119. Mentally he is stable and oriented.  NS. Weak and fatigued.  No t/t of lung cancer t/t.

## 2022-09-10 NOTE — ED Notes (Signed)
ED Provider at bedside. 

## 2022-09-10 NOTE — Assessment & Plan Note (Signed)
-  Nicotine patch 

## 2022-09-10 NOTE — ED Notes (Signed)
Spoke with wife on phone concerning Na+ level and home meds.  She is awaiting phone call to discuss with either MD of pharmacy tech.  Both have been made aware.     Pharmacy at bedside now.  NA+ reported to pt.

## 2022-09-10 NOTE — Assessment & Plan Note (Addendum)
Compensated and euvolemic.  Cont lasix at 20 mg po daily.

## 2022-09-11 DIAGNOSIS — E039 Hypothyroidism, unspecified: Secondary | ICD-10-CM | POA: Diagnosis present

## 2022-09-11 DIAGNOSIS — K219 Gastro-esophageal reflux disease without esophagitis: Secondary | ICD-10-CM | POA: Diagnosis present

## 2022-09-11 DIAGNOSIS — Z981 Arthrodesis status: Secondary | ICD-10-CM | POA: Diagnosis not present

## 2022-09-11 DIAGNOSIS — R531 Weakness: Secondary | ICD-10-CM | POA: Diagnosis not present

## 2022-09-11 DIAGNOSIS — Z9981 Dependence on supplemental oxygen: Secondary | ICD-10-CM | POA: Diagnosis not present

## 2022-09-11 DIAGNOSIS — Z9221 Personal history of antineoplastic chemotherapy: Secondary | ICD-10-CM | POA: Diagnosis not present

## 2022-09-11 DIAGNOSIS — E871 Hypo-osmolality and hyponatremia: Secondary | ICD-10-CM | POA: Diagnosis present

## 2022-09-11 DIAGNOSIS — E785 Hyperlipidemia, unspecified: Secondary | ICD-10-CM | POA: Diagnosis present

## 2022-09-11 DIAGNOSIS — J4489 Other specified chronic obstructive pulmonary disease: Secondary | ICD-10-CM | POA: Diagnosis present

## 2022-09-11 DIAGNOSIS — I2510 Atherosclerotic heart disease of native coronary artery without angina pectoris: Secondary | ICD-10-CM | POA: Diagnosis present

## 2022-09-11 DIAGNOSIS — F419 Anxiety disorder, unspecified: Secondary | ICD-10-CM | POA: Diagnosis present

## 2022-09-11 DIAGNOSIS — I5032 Chronic diastolic (congestive) heart failure: Secondary | ICD-10-CM | POA: Diagnosis present

## 2022-09-11 DIAGNOSIS — Z79890 Hormone replacement therapy (postmenopausal): Secondary | ICD-10-CM | POA: Diagnosis not present

## 2022-09-11 DIAGNOSIS — E222 Syndrome of inappropriate secretion of antidiuretic hormone: Secondary | ICD-10-CM | POA: Diagnosis present

## 2022-09-11 DIAGNOSIS — I110 Hypertensive heart disease with heart failure: Secondary | ICD-10-CM | POA: Diagnosis present

## 2022-09-11 DIAGNOSIS — Z923 Personal history of irradiation: Secondary | ICD-10-CM | POA: Diagnosis not present

## 2022-09-11 DIAGNOSIS — Z85118 Personal history of other malignant neoplasm of bronchus and lung: Secondary | ICD-10-CM | POA: Diagnosis not present

## 2022-09-11 DIAGNOSIS — F17210 Nicotine dependence, cigarettes, uncomplicated: Secondary | ICD-10-CM | POA: Diagnosis present

## 2022-09-11 DIAGNOSIS — F32A Depression, unspecified: Secondary | ICD-10-CM | POA: Diagnosis present

## 2022-09-11 DIAGNOSIS — Z79899 Other long term (current) drug therapy: Secondary | ICD-10-CM | POA: Diagnosis not present

## 2022-09-11 LAB — BASIC METABOLIC PANEL
Anion gap: 5 (ref 5–15)
BUN: 20 mg/dL (ref 8–23)
CO2: 26 mmol/L (ref 22–32)
Calcium: 8.2 mg/dL — ABNORMAL LOW (ref 8.9–10.3)
Chloride: 86 mmol/L — ABNORMAL LOW (ref 98–111)
Creatinine, Ser: 0.57 mg/dL — ABNORMAL LOW (ref 0.61–1.24)
GFR, Estimated: >60 mL/min (ref >60)
Glucose, Bld: 125 mg/dL — ABNORMAL HIGH (ref 70–99)
Potassium: 4.3 mmol/L (ref 3.5–5.1)
Sodium: 117 mmol/L — CL (ref 135–145)

## 2022-09-11 LAB — COMPREHENSIVE METABOLIC PANEL
ALT: 9 U/L (ref 0–44)
AST: 15 U/L (ref 15–41)
Albumin: 3.3 g/dL — ABNORMAL LOW (ref 3.5–5.0)
Alkaline Phosphatase: 71 U/L (ref 38–126)
Anion gap: 7 (ref 5–15)
BUN: 13 mg/dL (ref 8–23)
CO2: 25 mmol/L (ref 22–32)
Calcium: 8.5 mg/dL — ABNORMAL LOW (ref 8.9–10.3)
Chloride: 88 mmol/L — ABNORMAL LOW (ref 98–111)
Creatinine, Ser: 0.4 mg/dL — ABNORMAL LOW (ref 0.61–1.24)
GFR, Estimated: >60 mL/min (ref >60)
Glucose, Bld: 85 mg/dL (ref 70–99)
Potassium: 4.3 mmol/L (ref 3.5–5.1)
Sodium: 120 mmol/L — ABNORMAL LOW (ref 135–145)
Total Bilirubin: 0.5 mg/dL (ref 0.3–1.2)
Total Protein: 6 g/dL — ABNORMAL LOW (ref 6.5–8.1)

## 2022-09-11 LAB — SODIUM: Sodium: 122 mmol/L — ABNORMAL LOW (ref 135–145)

## 2022-09-11 LAB — HEPATIC FUNCTION PANEL
ALT: 9 U/L (ref 0–44)
AST: 17 U/L (ref 15–41)
Albumin: 3.1 g/dL — ABNORMAL LOW (ref 3.5–5.0)
Alkaline Phosphatase: 70 U/L (ref 38–126)
Bilirubin, Direct: <0.1 mg/dL (ref 0.0–0.2)
Total Bilirubin: 0.5 mg/dL (ref 0.3–1.2)
Total Protein: 5.5 g/dL — ABNORMAL LOW (ref 6.5–8.1)

## 2022-09-11 LAB — CBC
HCT: 32.6 % — ABNORMAL LOW (ref 39.0–52.0)
Hemoglobin: 12.1 g/dL — ABNORMAL LOW (ref 13.0–17.0)
MCH: 32.4 pg (ref 26.0–34.0)
MCHC: 37.1 g/dL — ABNORMAL HIGH (ref 30.0–36.0)
MCV: 87.4 fL (ref 80.0–100.0)
Platelets: 188 10*3/uL (ref 150–400)
RBC: 3.73 MIL/uL — ABNORMAL LOW (ref 4.22–5.81)
RDW: 11.7 % (ref 11.5–15.5)
WBC: 5.4 10*3/uL (ref 4.0–10.5)
nRBC: 0 % (ref 0.0–0.2)

## 2022-09-11 LAB — OSMOLALITY: Osmolality: 246 mOsm/kg — CL (ref 275–295)

## 2022-09-11 LAB — CORTISOL-AM, BLOOD: Cortisol - AM: 3.6 ug/dL — ABNORMAL LOW (ref 6.7–22.6)

## 2022-09-11 MED ORDER — MIRTAZAPINE 15 MG PO TABS
7.5000 mg | ORAL_TABLET | Freq: Every day | ORAL | Status: DC
  Administered 2022-09-11: 7.5 mg via ORAL
  Filled 2022-09-11: qty 1

## 2022-09-11 MED ORDER — AZITHROMYCIN 250 MG PO TABS
500.0000 mg | ORAL_TABLET | Freq: Every day | ORAL | Status: DC
  Administered 2022-09-11 – 2022-09-13 (×3): 500 mg via ORAL
  Filled 2022-09-11 (×3): qty 2

## 2022-09-11 MED ORDER — DRONABINOL 2.5 MG PO CAPS
5.0000 mg | ORAL_CAPSULE | Freq: Two times a day (BID) | ORAL | Status: DC
  Administered 2022-09-11 – 2022-09-13 (×5): 5 mg via ORAL
  Filled 2022-09-11 (×5): qty 2

## 2022-09-11 MED ORDER — PANTOPRAZOLE SODIUM 40 MG PO TBEC
40.0000 mg | DELAYED_RELEASE_TABLET | Freq: Every day | ORAL | Status: DC
  Administered 2022-09-11: 40 mg via ORAL
  Filled 2022-09-11: qty 1

## 2022-09-11 MED ORDER — TADALAFIL 5 MG PO TABS: 5.0000 mg | ORAL_TABLET | Freq: Every day | ORAL | Status: DC | PRN

## 2022-09-11 MED ORDER — GABAPENTIN 100 MG PO CAPS
100.0000 mg | ORAL_CAPSULE | Freq: Every day | ORAL | Status: DC
  Administered 2022-09-11 – 2022-09-12 (×3): 100 mg via ORAL
  Filled 2022-09-11 (×3): qty 1

## 2022-09-11 MED ORDER — BUDESON-GLYCOPYRROL-FORMOTEROL 160-9-4.8 MCG/ACT IN AERO: 2.0000 | INHALATION_SPRAY | Freq: Every day | RESPIRATORY_TRACT | Status: DC

## 2022-09-11 MED ORDER — ETHAMBUTOL HCL 100 MG PO TABS
100.0000 mg | ORAL_TABLET | Freq: Every day | ORAL | Status: DC
  Administered 2022-09-11 – 2022-09-13 (×3): 100 mg via ORAL
  Filled 2022-09-11 (×4): qty 1

## 2022-09-11 MED ORDER — UMECLIDINIUM-VILANTEROL 62.5-25 MCG/ACT IN AEPB
1.0000 | INHALATION_SPRAY | Freq: Every day | RESPIRATORY_TRACT | Status: DC
  Administered 2022-09-13: 1 via RESPIRATORY_TRACT
  Filled 2022-09-11: qty 14

## 2022-09-11 MED ORDER — BUDESONIDE 0.25 MG/2ML IN SUSP
0.2500 mg | Freq: Every day | RESPIRATORY_TRACT | Status: DC
  Administered 2022-09-11 – 2022-09-13 (×3): 0.25 mg via RESPIRATORY_TRACT
  Filled 2022-09-11 (×3): qty 2

## 2022-09-11 MED ORDER — ATORVASTATIN CALCIUM 10 MG PO TABS
10.0000 mg | ORAL_TABLET | Freq: Every evening | ORAL | Status: DC
  Administered 2022-09-11: 10 mg via ORAL
  Filled 2022-09-11 (×2): qty 1

## 2022-09-11 MED ORDER — LEVOTHYROXINE SODIUM 100 MCG PO TABS
200.0000 ug | ORAL_TABLET | Freq: Every day | ORAL | Status: DC
  Administered 2022-09-11 – 2022-09-13 (×3): 200 ug via ORAL
  Filled 2022-09-11 (×3): qty 2

## 2022-09-11 MED ORDER — HEPARIN SODIUM (PORCINE) 5000 UNIT/ML IJ SOLN
5000.0000 [IU] | Freq: Two times a day (BID) | INTRAMUSCULAR | Status: AC
  Administered 2022-09-11: 5000 [IU] via SUBCUTANEOUS
  Filled 2022-09-11: qty 1

## 2022-09-11 MED ORDER — MAGNESIUM OXIDE -MG SUPPLEMENT 400 (240 MG) MG PO TABS
200.0000 mg | ORAL_TABLET | Freq: Three times a day (TID) | ORAL | Status: DC
  Administered 2022-09-11 – 2022-09-13 (×6): 200 mg via ORAL
  Filled 2022-09-11 (×6): qty 1

## 2022-09-11 MED ORDER — ETHAMBUTOL HCL 400 MG PO TABS: 15.0000 mg/kg | ORAL_TABLET | Freq: Every day | ORAL | Status: DC

## 2022-09-11 MED ORDER — TOLVAPTAN 15 MG PO TABS
15.0000 mg | ORAL_TABLET | Freq: Once | ORAL | Status: AC
  Administered 2022-09-11: 15 mg via ORAL
  Filled 2022-09-11: qty 1

## 2022-09-11 MED ORDER — COSYNTROPIN 0.25 MG IJ SOLR
0.2500 mg | Freq: Once | INTRAMUSCULAR | Status: AC
  Administered 2022-09-12: 0.25 mg via INTRAVENOUS
  Filled 2022-09-11: qty 0.25

## 2022-09-11 MED ORDER — SODIUM CHLORIDE 1 G PO TABS
4.0000 g | ORAL_TABLET | Freq: Three times a day (TID) | ORAL | Status: DC
  Administered 2022-09-11 (×2): 4 g via ORAL
  Filled 2022-09-11 (×2): qty 4

## 2022-09-11 MED ORDER — DIAZEPAM 5 MG PO TABS
5.0000 mg | ORAL_TABLET | Freq: Every evening | ORAL | Status: DC | PRN
  Administered 2022-09-11 – 2022-09-12 (×2): 5 mg via ORAL
  Filled 2022-09-11 (×2): qty 1

## 2022-09-11 MED ORDER — ETHAMBUTOL HCL 400 MG PO TABS
1200.0000 mg | ORAL_TABLET | Freq: Every day | ORAL | Status: DC
  Administered 2022-09-11 – 2022-09-13 (×3): 1200 mg via ORAL
  Filled 2022-09-11 (×3): qty 3

## 2022-09-11 MED ORDER — ENOXAPARIN SODIUM 40 MG/0.4ML IJ SOSY
40.0000 mg | PREFILLED_SYRINGE | INTRAMUSCULAR | Status: DC
  Administered 2022-09-12 – 2022-09-13 (×2): 40 mg via SUBCUTANEOUS
  Filled 2022-09-11 (×2): qty 0.4

## 2022-09-11 MED ORDER — FERROUS SULFATE 325 (65 FE) MG PO TABS
325.0000 mg | ORAL_TABLET | Freq: Every day | ORAL | Status: DC
  Administered 2022-09-11 – 2022-09-12 (×3): 325 mg via ORAL
  Filled 2022-09-11 (×3): qty 1

## 2022-09-11 MED ORDER — MIRTAZAPINE 15 MG PO TABS: 7.5000 mg | ORAL_TABLET | Freq: Every day | ORAL | Status: DC

## 2022-09-11 MED ORDER — RIFAMPIN 300 MG PO CAPS
600.0000 mg | ORAL_CAPSULE | Freq: Every day | ORAL | Status: DC
  Administered 2022-09-11 – 2022-09-13 (×3): 600 mg via ORAL
  Filled 2022-09-11 (×3): qty 2

## 2022-09-11 NOTE — Progress Notes (Signed)
[facility]  ROUNDING NOTE   Subjective:   ELWOOD BAZINET is a 66 year old male with medical conditions including hypertension, hyperlipidemia, depression and anxiety, COPD, and left-sided non-small cell carcinoma postradiation and completed chemotherapy.  Patient presents from [place] [facility] due to hyponatremia.  Patient admitted under observation for Chronic hyponatremia [E87.1] Hyponatremia [E87.1] Generalized weakness [R53.1]  Patient is known to our clinic and receives outpatient follow up with Dr Candiss Norse.  Patient last seen in office in September.  Patient was recently admitted last month for hyponatremia.  Was treated with salt tabs and tolvaptan at that time.  Patient states he began to feel nauseated and confused at home.  This is what prompted him to seek assistance in the emergency department.  Labs on ED arrival significant for sodium 118, BUN 7, creatinine 0.49, and hemoglobin 12.2.  Patient was started on IV fluids, normal saline at 50 mL/h sodium initially corrected to 120 however has now decreased to 117.  We have been consulted to help manage hyponatremia.   Objective:  Vital signs in last 24 hours:  Temp:  [97.8 F (36.6 C)-98.7 F (37.1 C)] 98 F (36.7 C) (11/11 1155) Pulse Rate:  [70-92] 92 (11/11 1155) Resp:  [16-29] 18 (11/11 1155) BP: (101-142)/(63-80) 122/78 (11/11 1155) SpO2:  [95 %-100 %] 97 % (11/11 1155) Weight:  [77.5 kg] 77.5 kg (11/11 0127)  Weight change:  Filed Weights   09/11/22 0127  Weight: 77.5 kg    Intake/Output: I/O last 3 completed shifts: In: 799.2 [IV Piggyback:799.2] Out: 900 [Urine:900]   Intake/Output this shift:  No intake/output data recorded.  Physical Exam: General: NAD  Head: Normocephalic, atraumatic. Moist oral mucosal membranes  Eyes: Anicteric  Lungs:  Diffuse wheezing, normal effort, room air  Heart: Regular rate and rhythm  Abdomen:  Soft, nontender  Extremities: No peripheral edema.  Neurologic:  Nonfocal, moving all four extremities  Skin: No lesions  Access: None    Basic Metabolic Panel: Recent Labs  Lab 09/10/22 1759 09/11/22 0422 09/11/22 1001  NA 118*  119* 120* 117*  K 4.7 4.3 4.3  CL 86* 88* 86*  CO2 27 25 26   GLUCOSE 106* 85 125*  BUN 7* 13 20  CREATININE 0.49* 0.40* 0.57*  CALCIUM 8.9 8.5* 8.2*    Liver Function Tests: Recent Labs  Lab 09/10/22 1759 09/11/22 0422 09/11/22 1001  AST 14* 15 17  ALT 10 9 9   ALKPHOS 79 71 70  BILITOT 0.4 0.5 0.5  PROT 6.8 6.0* 5.5*  ALBUMIN 3.7 3.3* 3.1*   No results for input(s): "LIPASE", "AMYLASE" in the last 168 hours. No results for input(s): "AMMONIA" in the last 168 hours.  CBC: Recent Labs  Lab 09/10/22 1759 09/11/22 0422  WBC 5.8 5.4  NEUTROABS 4.7  --   HGB 12.2* 12.1*  HCT 33.8* 32.6*  MCV 87.6 87.4  PLT 193 188    Cardiac Enzymes: No results for input(s): "CKTOTAL", "CKMB", "CKMBINDEX", "TROPONINI" in the last 168 hours.  BNP: Invalid input(s): "POCBNP"  CBG: No results for input(s): "GLUCAP" in the last 168 hours.  Microbiology: Results for orders placed or performed during the hospital encounter of 08/06/22  MRSA Next Gen by PCR, Nasal     Status: None   Collection Time: 08/07/22  1:08 AM   Specimen: Nasal Mucosa; Nasal Swab  Result Value Ref Range Status   MRSA by PCR Next Gen NOT DETECTED NOT DETECTED Final    Comment: (NOTE) The GeneXpert MRSA Assay (  FDA approved for NASAL specimens only), is one component of a comprehensive MRSA colonization surveillance program. It is not intended to diagnose MRSA infection nor to guide or monitor treatment for MRSA infections. Test performance is not FDA approved in patients less than 34 years old. Performed at [facility], [address] [place], [place] [address]     Coagulation Studies: No results for input(s): "LABPROT", "INR" in the last 72 hours.  Urinalysis: No results for input(s): "COLORURINE", "LABSPEC",  "PHURINE", "GLUCOSEU", "HGBUR", "BILIRUBINUR", "KETONESUR", "PROTEINUR", "UROBILINOGEN", "NITRITE", "LEUKOCYTESUR" in the last 72 hours.  Invalid input(s): "APPERANCEUR"    Imaging: No results found.   Medications:     atorvastatin  10 mg Oral QPM   azithromycin  500 mg Oral Daily   budesonide (PULMICORT) nebulizer solution  0.25 mg Nebulization Daily   And   umeclidinium-vilanterol  1 puff Inhalation Daily   [START ON 09/12/2022] cosyntropin  0.25 mg Intravenous Once   dronabinol  5 mg Oral BID AC   [START ON 09/12/2022] enoxaparin (LOVENOX) injection  40 mg Subcutaneous Q24H   ethambutol  1,200 mg Oral Daily   And   ethambutol  100 mg Oral Daily   ferrous sulfate  325 mg Oral QHS   gabapentin  100 mg Oral QHS   heparin  5,000 Units Subcutaneous Q12H   levothyroxine  200 mcg Oral Q0600   magnesium oxide  200 mg Oral TID AC   nicotine  21 mg Transdermal Daily   rifampin  600 mg Oral Daily   sodium chloride flush  3 mL Intravenous Q12H   sodium chloride  4 g Oral TID WC   acetaminophen **OR** acetaminophen, diazepam, HYDROcodone-acetaminophen, morphine injection  Assessment/ Plan:  Mr. KAUSHIK MAUL is a 66 y.o.  male is a 67 year old male with medical conditions including hypertension, hyperlipidemia, depression and anxiety, COPD, and left-sided non-small cell carcinoma postradiation and completed chemotherapy.  Patient presents from [place] [facility] due to hyponatremia.  Patient admitted under observation for Chronic hyponatremia [E87.1] Hyponatremia [E87.1] Generalized weakness [R53.1]   Hyponatremia likely due to SIADH, patient with history of lung cancer.  Patient has been previously treated with tolvaptan, salt tabs, and fluid restriction during previous admissions.  Recommend stopping IV fluid.  Will order patient tolvaptan 15 mg.  Discussed with primary team, considering increasing salt tablets.  Will consider replacing free water fluid restriction tomorrow.   Currently monitoring.      LOS: 0 Madigan Rosensteel 11/11/202312:22 PM

## 2022-09-11 NOTE — ED Notes (Addendum)
Spoke with Ria Comment RN who accepted admission.  Transport notified of pt needing to go to room 249

## 2022-09-11 NOTE — Progress Notes (Addendum)
PROGRESS NOTE    Paul Carpenter  ZLD:[id number] DOB: [birth year] DOA: 09/10/2022 PCP: [name], MD  Outpatient Specialists: oncology, nephrology, pulmonology    Brief Narrative:   Admitted for recurrent symptomatic hyponatremia   Assessment & Plan:   Principal Problem:   Generalized weakness Active Problems:   SIADH (syndrome of inappropriate ADH production) (HCC)   Chronic hyponatremia   COPD (chronic obstructive pulmonary disease) ([place])   Benign essential hypertension   Cigarette smoker   Hypothyroidism, acquired   HFrEF (heart failure with reduced ejection fraction) ([place])   # Hyponatremia, chronic # SIADH Mildy symptomatic. Na 119 on arrival, 120 today. Chronic problem, recently hospitalized for such. Primary driver is siadh though tsh is elevated and am cortisol is low. Apparently didn't tolerate lasix 2/2 orthostasis. Says has been taking his oral salt tablets but says compliance with fluid restriction hasn't been great.  - stop normal saline as that won't help this problem - increase sodium chloride tablets to 4 mg tid (says currently taking 8 mg daily) - if urine osm is elevated consider adding back lasix - nephrology on board, has added tolvaptan as had some success w/ that previously - monitor sodium bid - pharmacy med rec for SIADH-inducing meds  # Hypothyroidism TSH elevated, may contribute to hyponatremia - continue increased dose of levothyroxine 200  # Low cortisol - cosyntropin test tomorrow  # Lung cancer Follows with oncology  # MAC Follows with ID and pulm - cont home ethambutol, azithromycin, rifampin  # COPD  Quiescent - cont pulmicort, anoro  DVT prophylaxis: lovenox Code Status: full Family Communication: wife updated telephonically 10/11  Level of care: Progressive    Consultants:  nephro  Procedures: none  Antimicrobials:  none    Subjective: Feeling fatigued  Objective: Vitals:   09/11/22 0127 09/11/22  0443 09/11/22 0730 09/11/22 0833  BP: (!) 142/76 126/80 121/79   Pulse: 79 84 81   Resp: _0 Temp: 98.7 F (37.1 C) 98.7 F (37.1 C) 98 F (36.7 C)   TempSrc: Oral     SpO2: 98% 95% 100% 98%  Weight: 77.5 kg     Height: 6' (1.829 m)       Intake/Output Summary (Last 24 hours) at 09/11/2022 0938 Last data filed at 09/11/2022 0446 Gross per 24 hour  Intake 799.2 ml  Output 900 ml  Net -100.8 ml   Filed Weights   09/11/22 0127  Weight: 77.5 kg    Examination:  General exam: Appears calm and comfortable  Respiratory system: Clear to auscultation. Respiratory effort normal. Cardiovascular system: S1 & S2 heard, RRR. No JVD, murmurs, rubs, gallops or clicks. No pedal edema. Gastrointestinal system: Abdomen is nondistended, soft and nontender. No organomegaly or masses felt. Normal bowel sounds heard. Central nervous system: Alert and oriented. No focal neurological deficits. Extremities: Symmetric 5 x 5 power. Skin: No rashes, lesions or ulcers Psychiatry: Judgement and insight appear normal. Mood & affect appropriate.     Data Reviewed: I have personally reviewed following labs and imaging studies  CBC: Recent Labs  Lab 09/10/22 1759 09/11/22 0422  WBC 5.8 5.4  NEUTROABS 4.7  --   HGB 12.2* 12.1*  HCT 33.8* 32.6*  MCV 87.6 87.4  PLT 193 903   Basic Metabolic Panel: Recent Labs  Lab 09/10/22 1759 09/11/22 0422  NA 118*  119* 120*  K 4.7 4.3  CL 86* 88*  CO2 27 25  GLUCOSE 106* 85  BUN 7* 13  CREATININE 0.49* 0.40*  CALCIUM 8.9 8.5*   GFR: Estimated Creatinine Clearance: 100.9 mL/min (A) (by C-G formula based on SCr of 0.4 mg/dL (L)). Liver Function Tests: Recent Labs  Lab 09/10/22 1759 09/11/22 0422  AST 14* 15  ALT 10 9  ALKPHOS 79 71  BILITOT 0.4 0.5  PROT 6.8 6.0*  ALBUMIN 3.7 3.3*   No results for input(s): "LIPASE", "AMYLASE" in the last 168 hours. No results for input(s): "AMMONIA" in the last 168 hours. Coagulation  Profile: No results for input(s): "INR", "PROTIME" in the last 168 hours. Cardiac Enzymes: No results for input(s): "CKTOTAL", "CKMB", "CKMBINDEX", "TROPONINI" in the last 168 hours. BNP (last 3 results) No results for input(s): "PROBNP" in the last 8760 hours. HbA1C: No results for input(s): "HGBA1C" in the last 72 hours. CBG: No results for input(s): "GLUCAP" in the last 168 hours. Lipid Profile: No results for input(s): "CHOL", "HDL", "LDLCALC", "TRIG", "CHOLHDL", "LDLDIRECT" in the last 72 hours. Thyroid Function Tests: Recent Labs    09/10/22 1759  TSH 18.827*  FREET4 0.76   Anemia Panel: No results for input(s): "VITAMINB12", "FOLATE", "FERRITIN", "TIBC", "IRON", "RETICCTPCT" in the last 72 hours. Urine analysis:    Component Value Date/Time   COLORURINE YELLOW (A) 02/03/2022 2057   APPEARANCEUR CLEAR (A) 02/03/2022 2057   APPEARANCEUR Clear 08/15/2020 0951   LABSPEC 1.030 02/03/2022 2057   LABSPEC 1.004 01/26/2012 0944   PHURINE 7.0 02/03/2022 2057   GLUCOSEU NEGATIVE 02/03/2022 2057   GLUCOSEU Negative 01/26/2012 0944   HGBUR NEGATIVE 02/03/2022 2057   BILIRUBINUR NEGATIVE 02/03/2022 2057   BILIRUBINUR Negative 08/15/2020 0951   BILIRUBINUR Negative 01/26/2012 [place] 02/03/2022 2057   PROTEINUR NEGATIVE 02/03/2022 2057   NITRITE NEGATIVE 02/03/2022 2057   LEUKOCYTESUR NEGATIVE 02/03/2022 2057   LEUKOCYTESUR Negative 01/26/2012 0944   Sepsis Labs: _0 (procalcitonin:4,lacticidven:4)  )No results found for this or any previous visit (from the past 240 hour(s)).       Radiology Studies: No results found.      Scheduled Meds:  budesonide (PULMICORT) nebulizer solution  0.25 mg Nebulization Daily   And   umeclidinium-vilanterol  1 puff Inhalation Daily   dronabinol  5 mg Oral BID AC   ethambutol  1,200 mg Oral Daily   And   ethambutol  100 mg Oral Daily   ferrous sulfate  325 mg Oral QHS   gabapentin  100 mg Oral QHS    heparin  5,000 Units Subcutaneous Q12H   levothyroxine  200 mcg Oral Q0600   nicotine  21 mg Transdermal Daily   sodium chloride flush  3 mL Intravenous Q12H   Continuous Infusions:   LOS: 0 days     Desma Maxim, MD [facility]   If 7PM-7AM, please contact night-coverage [email or web address] Password [facility] 09/11/2022, 9:38 AM

## 2022-09-12 DIAGNOSIS — R531 Weakness: Secondary | ICD-10-CM | POA: Diagnosis not present

## 2022-09-12 LAB — BASIC METABOLIC PANEL
Anion gap: 5 (ref 5–15)
BUN: 12 mg/dL (ref 8–23)
CO2: 25 mmol/L (ref 22–32)
Calcium: 8.6 mg/dL — ABNORMAL LOW (ref 8.9–10.3)
Chloride: 96 mmol/L — ABNORMAL LOW (ref 98–111)
Creatinine, Ser: 0.57 mg/dL — ABNORMAL LOW (ref 0.61–1.24)
GFR, Estimated: >60 mL/min (ref >60)
Glucose, Bld: 86 mg/dL (ref 70–99)
Potassium: 4.3 mmol/L (ref 3.5–5.1)
Sodium: 126 mmol/L — ABNORMAL LOW (ref 135–145)

## 2022-09-12 LAB — ACTH STIMULATION, 3 TIME POINTS
Cortisol, 30 Min: 6 ug/dL
Cortisol, 60 Min: 19.1 ug/dL
Cortisol, Base: 24.9 ug/dL

## 2022-09-12 LAB — OSMOLALITY, URINE: Osmolality, Ur: 80 mOsm/kg — ABNORMAL LOW (ref 300–900)

## 2022-09-12 MED ORDER — TOLVAPTAN 15 MG PO TABS
15.0000 mg | ORAL_TABLET | Freq: Once | ORAL | Status: AC
  Administered 2022-09-12: 15 mg via ORAL
  Filled 2022-09-12: qty 1

## 2022-09-12 NOTE — Progress Notes (Signed)
[facility]  ROUNDING NOTE   Subjective:   Paul Carpenter is a 66 year old male with medical conditions including hypertension, hyperlipidemia, depression and anxiety, COPD, and left-sided non-small cell carcinoma postradiation and completed chemotherapy.  Patient presents from [facility] due to hyponatremia.  Patient admitted under observation for Chronic hyponatremia [E87.1] Hyponatremia [E87.1] Generalized weakness [R53.1]  Patient is known to our clinic and receives outpatient follow up with Dr Candiss Norse.    Patient seen resting in bed, alert and oriented States he feels much improved today Sodium improved to 126 No lower extremity edema   Objective:  Vital signs in last 24 hours:  Temp:  [97.9 F (36.6 C)-98.8 F (37.1 C)] 98.8 F (37.1 C) (11/12 0752) Pulse Rate:  [84-93] 88 (11/12 0752) Resp:  [16-20] 16 (11/12 0752) BP: (98-122)/(71-78) 102/71 (11/12 0752) SpO2:  [94 %-100 %] 95 % (11/12 0752)  Weight change:  Filed Weights   09/11/22 0127  Weight: 77.5 kg    Intake/Output: I/O last 3 completed shifts: In: 1399.2 [P.O.:600; IV Piggyback:799.2] Out: 900 [Urine:900]   Intake/Output this shift:  No intake/output data recorded.  Physical Exam: General: NAD  Head: Normocephalic, atraumatic. Moist oral mucosal membranes  Eyes: Anicteric  Lungs:  Clear to auscultation, normal effort, room air  Heart: Regular rate and rhythm  Abdomen:  Soft, nontender  Extremities: No peripheral edema.  Neurologic: Nonfocal, moving all four extremities  Skin: No lesions  Access: None    Basic Metabolic Panel: Recent Labs  Lab 09/10/22 1759 09/11/22 0422 09/11/22 1001 09/11/22 1544 09/12/22 0451  NA 118*  119* 120* 117* 122* 126*  K 4.7 4.3 4.3  --  4.3  CL 86* 88* 86*  --  96*  CO2 27 25 26   --  25  GLUCOSE 106* 85 125*  --  86  BUN 7* 13 20  --  12  CREATININE 0.49* 0.40* 0.57*  --  0.57*  CALCIUM 8.9 8.5* 8.2*  --  8.6*     Liver Function  Tests: Recent Labs  Lab 09/10/22 1759 09/11/22 0422 09/11/22 1001  AST 14* 15 17  ALT 10 9 9   ALKPHOS 79 71 70  BILITOT 0.4 0.5 0.5  PROT 6.8 6.0* 5.5*  ALBUMIN 3.7 3.3* 3.1*    No results for input(s): "LIPASE", "AMYLASE" in the last 168 hours. No results for input(s): "AMMONIA" in the last 168 hours.  CBC: Recent Labs  Lab 09/10/22 1759 09/11/22 0422  WBC 5.8 5.4  NEUTROABS 4.7  --   HGB 12.2* 12.1*  HCT 33.8* 32.6*  MCV 87.6 87.4  PLT 193 188     Cardiac Enzymes: No results for input(s): "CKTOTAL", "CKMB", "CKMBINDEX", "TROPONINI" in the last 168 hours.  BNP: Invalid input(s): "POCBNP"  CBG: No results for input(s): "GLUCAP" in the last 168 hours.  Microbiology: Results for orders placed or performed during the hospital encounter of 08/06/22  MRSA Next Gen by PCR, Nasal     Status: None   Collection Time: 08/07/22  1:08 AM   Specimen: Nasal Mucosa; Nasal Swab  Result Value Ref Range Status   MRSA by PCR Next Gen NOT DETECTED NOT DETECTED Final    Comment: (NOTE) The GeneXpert MRSA Assay (FDA approved for NASAL specimens only), is one component of a comprehensive MRSA colonization surveillance program. It is not intended to diagnose MRSA infection nor to guide or monitor treatment for MRSA infections. Test performance is not FDA approved in patients less than 2 years  old. Performed at [facility], [address] [place], [place] [address]     Coagulation Studies: No results for input(s): "LABPROT", "INR" in the last 72 hours.  Urinalysis: No results for input(s): "COLORURINE", "LABSPEC", "PHURINE", "GLUCOSEU", "HGBUR", "BILIRUBINUR", "KETONESUR", "PROTEINUR", "UROBILINOGEN", "NITRITE", "LEUKOCYTESUR" in the last 72 hours.  Invalid input(s): "APPERANCEUR"    Imaging: No results found.   Medications:     atorvastatin  10 mg Oral QPM   azithromycin  500 mg Oral Daily   budesonide (PULMICORT) nebulizer solution  0.25 mg  Nebulization Daily   And   umeclidinium-vilanterol  1 puff Inhalation Daily   dronabinol  5 mg Oral BID AC   enoxaparin (LOVENOX) injection  40 mg Subcutaneous Q24H   ethambutol  1,200 mg Oral Daily   And   ethambutol  100 mg Oral Daily   ferrous sulfate  325 mg Oral QHS   gabapentin  100 mg Oral QHS   levothyroxine  200 mcg Oral Q0600   magnesium oxide  200 mg Oral TID AC   nicotine  21 mg Transdermal Daily   rifampin  600 mg Oral Daily   sodium chloride flush  3 mL Intravenous Q12H   acetaminophen **OR** acetaminophen, diazepam, HYDROcodone-acetaminophen, morphine injection  Assessment/ Plan:  [name] is a 66 y.o.  male is a 66 year old male with medical conditions including hypertension, hyperlipidemia, depression and anxiety, COPD, and left-sided non-small cell carcinoma postradiation and completed chemotherapy.  Patient presents from [place] [facility] due to hyponatremia.  Patient admitted under observation for Chronic hyponatremia [E87.1] Hyponatremia [E87.1] Generalized weakness [R53.1]   Hyponatremia likely due to SIADH, patient with history of lung cancer.  Patient has been previously treated with tolvaptan, salt tabs, and fluid restriction during previous admissions.  Patient received 1 dose tolvaptan 15 mg yesterday, sodium corrected to 126.  We will repeat this dose today.  Goal sodium 129-130 prior to considering discharge.  Will reassess in a.m.    LOS: 1 Lakya Schrupp 11/12/202311:14 AM

## 2022-09-12 NOTE — Progress Notes (Addendum)
PROGRESS NOTE    Paul Carpenter  OEV:[id number] DOB: Nov 06, 1955 DOA: 09/10/2022 PCP: [name], MD  Outpatient Specialists: oncology, nephrology, pulmonology    Brief Narrative:   Admitted for recurrent symptomatic hyponatremia   Assessment & Plan:   Principal Problem:   Generalized weakness Active Problems:   SIADH (syndrome of inappropriate ADH production) (HCC)   Chronic hyponatremia   COPD (chronic obstructive pulmonary disease) ([place])   Benign essential hypertension   Cigarette smoker   Hypothyroidism, acquired   HFrEF (heart failure with reduced ejection fraction) ([place])   Hyponatremia   # Hyponatremia, chronic # SIADH Mildy symptomatic. Na 119 on arrival, 126 today. Chronic problem, recently hospitalized for such. Primary driver is siadh though tsh is elevated and am cortisol is low. Apparently didn't tolerate lasix 2/2 orthostasis. Says has been taking his oral salt tablets but says compliance with fluid restriction hasn't been great.  - salt tabs held given robust response to tolvaptan - nephrology on board, has added tolvaptan as had some success w/ that previously, appears to be responding - hold mirtazapine  # Hypothyroidism TSH elevated, may contribute to hyponatremia - continue increased dose of levothyroxine 200  # Low cortisol No hx chronic steroids. ACTH stim test this morning normal, adrenal insufficiency ruled out  # Lung cancer Follows with oncology  # MAC Follows with ID and pulm - cont home ethambutol, azithromycin, rifampin  # COPD  Quiescent - cont pulmicort, anoro  DVT prophylaxis: lovenox Code Status: full Family Communication: wife updated telephonically 10/12  Level of care: Progressive    Consultants:  nephro  Procedures: none  Antimicrobials:  none    Subjective: Feeling fatigued, somewhat improved  Objective: Vitals:   09/11/22 2059 09/12/22 0028 09/12/22 0457 09/12/22 0752  BP: 98/72 102/76 117/77  102/71  Pulse: 88 84 93 88  Resp: _0 Temp: 98.1 F (36.7 C) 98.3 F (36.8 C) 97.9 F (36.6 C) 98.8 F (37.1 C)  TempSrc:      SpO2: 99% 94% 95% 95%  Weight:      Height:        Intake/Output Summary (Last 24 hours) at 09/12/2022 1227 Last data filed at 09/11/2022 1600 Gross per 24 hour  Intake 240 ml  Output --  Net 240 ml   Filed Weights   09/11/22 0127  Weight: 77.5 kg    Examination:  General exam: Appears calm and comfortable  Respiratory system: Clear to auscultation. Respiratory effort normal. Cardiovascular system: S1 & S2 heard, RRR. No JVD, murmurs, rubs, gallops or clicks. No pedal edema. Gastrointestinal system: Abdomen is nondistended, soft and nontender. No organomegaly or masses felt. Normal bowel sounds heard. Central nervous system: Alert and oriented. No focal neurological deficits. Extremities: Symmetric 5 x 5 power. Skin: No rashes, lesions or ulcers Psychiatry: Judgement and insight appear normal. Mood & affect appropriate.     Data Reviewed: I have personally reviewed following labs and imaging studies  CBC: Recent Labs  Lab 09/10/22 1759 09/11/22 0422  WBC 5.8 5.4  NEUTROABS 4.7  --   HGB 12.2* 12.1*  HCT 33.8* 32.6*  MCV 87.6 87.4  PLT 193 829   Basic Metabolic Panel: Recent Labs  Lab 09/10/22 1759 09/11/22 0422 09/11/22 1001 09/11/22 1544 09/12/22 0451  NA 118*  119* 120* 117* 122* 126*  K 4.7 4.3 4.3  --  4.3  CL 86* 88* 86*  --  96*  CO2 _1 --  25  GLUCOSE 106* 85 125*  --  86  BUN 7* 13 20  --  12  CREATININE 0.49* 0.40* 0.57*  --  0.57*  CALCIUM 8.9 8.5* 8.2*  --  8.6*   GFR: Estimated Creatinine Clearance: 100.9 mL/min (A) (by C-G formula based on SCr of 0.57 mg/dL (L)). Liver Function Tests: Recent Labs  Lab 09/10/22 1759 09/11/22 0422 09/11/22 1001  AST 14* 15 17  ALT _0 ALKPHOS 79 71 70  BILITOT 0.4 0.5 0.5  PROT 6.8 6.0* 5.5*  ALBUMIN 3.7 3.3* 3.1*   No results for input(s):  "LIPASE", "AMYLASE" in the last 168 hours. No results for input(s): "AMMONIA" in the last 168 hours. Coagulation Profile: No results for input(s): "INR", "PROTIME" in the last 168 hours. Cardiac Enzymes: No results for input(s): "CKTOTAL", "CKMB", "CKMBINDEX", "TROPONINI" in the last 168 hours. BNP (last 3 results) No results for input(s): "PROBNP" in the last 8760 hours. HbA1C: No results for input(s): "HGBA1C" in the last 72 hours. CBG: No results for input(s): "GLUCAP" in the last 168 hours. Lipid Profile: No results for input(s): "CHOL", "HDL", "LDLCALC", "TRIG", "CHOLHDL", "LDLDIRECT" in the last 72 hours. Thyroid Function Tests: Recent Labs    09/10/22 1759  TSH 18.827*  FREET4 0.76   Anemia Panel: No results for input(s): "VITAMINB12", "FOLATE", "FERRITIN", "TIBC", "IRON", "RETICCTPCT" in the last 72 hours. Urine analysis:    Component Value Date/Time   COLORURINE YELLOW (A) 02/03/2022 2057   APPEARANCEUR CLEAR (A) 02/03/2022 2057   APPEARANCEUR Clear 08/15/2020 0951   LABSPEC 1.030 02/03/2022 2057   LABSPEC 1.004 01/26/2012 0944   PHURINE 7.0 02/03/2022 2057   GLUCOSEU NEGATIVE 02/03/2022 2057   GLUCOSEU Negative 01/26/2012 0944   HGBUR NEGATIVE 02/03/2022 2057   BILIRUBINUR NEGATIVE 02/03/2022 2057   BILIRUBINUR Negative 08/15/2020 0951   BILIRUBINUR Negative 01/26/2012 [place] 02/03/2022 2057   PROTEINUR NEGATIVE 02/03/2022 2057   NITRITE NEGATIVE 02/03/2022 2057   LEUKOCYTESUR NEGATIVE 02/03/2022 2057   LEUKOCYTESUR Negative 01/26/2012 0944   Sepsis Labs: _1 (procalcitonin:4,lacticidven:4)  )No results found for this or any previous visit (from the past 240 hour(s)).       Radiology Studies: No results found.      Scheduled Meds:  atorvastatin  10 mg Oral QPM   azithromycin  500 mg Oral Daily   budesonide (PULMICORT) nebulizer solution  0.25 mg Nebulization Daily   And   umeclidinium-vilanterol  1 puff Inhalation  Daily   dronabinol  5 mg Oral BID AC   enoxaparin (LOVENOX) injection  40 mg Subcutaneous Q24H   ethambutol  1,200 mg Oral Daily   And   ethambutol  100 mg Oral Daily   ferrous sulfate  325 mg Oral QHS   gabapentin  100 mg Oral QHS   levothyroxine  200 mcg Oral Q0600   magnesium oxide  200 mg Oral TID AC   nicotine  21 mg Transdermal Daily   rifampin  600 mg Oral Daily   sodium chloride flush  3 mL Intravenous Q12H   Continuous Infusions:   LOS: 1 day     Desma Maxim, MD [facility]   If 7PM-7AM, please contact night-coverage [email or web address] Password [facility] 09/12/2022, 12:27 PM

## 2022-09-13 DIAGNOSIS — R531 Weakness: Secondary | ICD-10-CM | POA: Diagnosis not present

## 2022-09-13 LAB — BASIC METABOLIC PANEL
Anion gap: 8 (ref 5–15)
BUN: 9 mg/dL (ref 8–23)
CO2: 27 mmol/L (ref 22–32)
Calcium: 9.4 mg/dL (ref 8.9–10.3)
Chloride: 97 mmol/L — ABNORMAL LOW (ref 98–111)
Creatinine, Ser: 0.75 mg/dL (ref 0.61–1.24)
GFR, Estimated: >60 mL/min (ref >60)
Glucose, Bld: 111 mg/dL — ABNORMAL HIGH (ref 70–99)
Potassium: 4.5 mmol/L (ref 3.5–5.1)
Sodium: 132 mmol/L — ABNORMAL LOW (ref 135–145)

## 2022-09-13 MED ORDER — SODIUM CHLORIDE 1 G PO TABS: 1.0000 g | ORAL_TABLET | Freq: Three times a day (TID) | ORAL | Status: DC

## 2022-09-13 NOTE — Discharge Summary (Signed)
Paul Carpenter RWE:[id number] DOB: 1956-02-03 DOA: 09/10/2022  PCP: [name], MD  Admit date: 09/10/2022 Discharge date: 09/13/2022  Time spent: 35 minutes  Recommendations for Outpatient Follow-up:  Sodium check 1-2 weeks, nephrology f/u after that     Discharge Diagnoses:  Principal Problem:   Generalized weakness Active Problems:   SIADH (syndrome of inappropriate ADH production) (HCC)   Chronic hyponatremia   COPD (chronic obstructive pulmonary disease) ([place])   Benign essential hypertension   Cigarette smoker   Hypothyroidism, acquired   HFrEF (heart failure with reduced ejection fraction) ([place])   Hyponatremia   Discharge Condition: stable  Diet recommendation:   Filed Weights   09/11/22 0127 09/13/22 0603  Weight: 77.5 kg 74.9 kg    History of present illness:  From admission h and p Paul Carpenter is an 66 y.o. male seen today for hyponatremia secondary to SIADH.  Patient is followed by nephrology Dr. Candiss Norse in the outpatient setting for his hyponatremia and is on salt tablets, fluid restriction and Lasix.  Patient has had hyponatremia chronically since 2017 per our routine blood work records.  Patient was admitted last month with similar complaints of generalized weakness where patient was started on 3% hypertonic saline and admitted to stepdown unit.   Hospital Course:  Patient was admitted for recurrent hyponatremia, mildly symptomatic. This is 2/2 siadh though hypothyroidism may contribute. Tsh elevated and levothyroxine was just recently increased by endo and so advised to continue that new dose and f/u as scheduled. AM cortisol low but cosyntropin test normal so think glucocorticoid deficiency unlikely. This hyponatremia is SIADH from his known lung pathology. He has been compliant with home salt tabs but wife says has had some indiscretions with regard to fluid restriction. Here he was treated with tolvaptan which increased sodium from upper 100s to  132 on day of discharge. Plan will be improved compliance with fluid restriction and increase in salt tabs from 8 mg daily to 12, with plan to check sodium level in 1-2 weeks and f/u with nephrology after that.  Procedures: none   Consultations: nephrology  Discharge Exam: Vitals:   09/13/22 0030 09/13/22 0454  BP: 117/79 118/76  Pulse: 79 80  Resp: 19 18  Temp: 98 F (36.7 C) 98.5 F (36.9 C)  SpO2: 98% 94%    General: NAD Cardiovascular: RRR Respiratory: CTAB  Discharge Instructions   Discharge Instructions     Diet - low sodium heart healthy   Complete by: As directed    Fluid restrict 800 ml   Increase activity slowly   Complete by: As directed       Allergies as of 09/13/2022       Reactions   Lisinopril Rash   Varenicline Rash        Medication List     STOP taking these medications    furosemide 20 MG tablet Commonly known as: LASIX       TAKE these medications    acetaminophen 500 MG tablet Commonly known as: TYLENOL Take 500 mg by mouth daily as needed.   albuterol (2.5 MG/3ML) 0.083% nebulizer solution Commonly known as: PROVENTIL Take 3 mLs (2.5 mg total) by nebulization 4 (four) times daily as needed for wheezing or shortness of breath.   albuterol 108 (90 Base) MCG/ACT inhaler Commonly known as: VENTOLIN HFA INHALE 2 PUFFS INTO THE LUNGS EVERY 4 HOURS AS NEEDED FOR WHEEZING OR SHORTNESS OF BREATH.   atorvastatin 10 MG tablet Commonly known as: LIPITOR Take  10 mg by mouth daily. What changed: Another medication with the same name was removed. Continue taking this medication, and follow the directions you see here.   azithromycin 500 MG tablet Commonly known as: ZITHROMAX Take 500 mg by mouth daily.   Breztri Aerosphere 160-9-4.8 MCG/ACT Aero Generic drug: Budeson-Glycopyrrol-Formoterol Inhale 2 puffs into the lungs in the morning and at bedtime.   Breztri Aerosphere 160-9-4.8 MCG/ACT Aero Generic drug:  Budeson-Glycopyrrol-Formoterol Inhale 2 puffs into the lungs in the morning and at bedtime.   diazepam 5 MG tablet Commonly known as: VALIUM Take 5 mg by mouth at bedtime as needed.   dronabinol 5 MG capsule Commonly known as: MARINOL Take 1 capsule (5 mg total) by mouth 2 (two) times daily before lunch and supper.   ethambutol 400 MG tablet Commonly known as: MYAMBUTOL Take 3 tablets (1,200 mg total) by mouth daily.   ferrous sulfate 325 (65 FE) MG tablet Take 325 mg by mouth at bedtime.   gabapentin 100 MG capsule Commonly known as: Neurontin Take 1 capsule (100 mg total) by mouth at bedtime.   levothyroxine 200 MCG tablet Commonly known as: SYNTHROID Take 200 mcg by mouth daily before breakfast. What changed: Another medication with the same name was removed. Continue taking this medication, and follow the directions you see here.   lidocaine-prilocaine cream Commonly known as: EMLA Apply 1 application topically as needed.   magnesium oxide 400 MG tablet Commonly known as: MAG-OX Take 600 mg by mouth 2 (two) times daily. Takes 1.5 tablet twice daily   mirtazapine 7.5 MG tablet Commonly known as: REMERON Take 1 tablet (7.5 mg total) by mouth at bedtime.   nitroGLYCERIN 0.4 MG SL tablet Commonly known as: NITROSTAT Place under the tongue.   OXYGEN Inhale 2 L into the lungs at bedtime.   pantoprazole 40 MG tablet Commonly known as: PROTONIX Take 1 tablet (40 mg total) by mouth 2 (two) times daily before meals   rifampin 300 MG capsule Commonly known as: RIFADIN Take 2 capsules (600 mg total) by mouth daily.   sodium chloride 1 g tablet Take 1 tablet (1 g total) by mouth 3 (three) times daily with meals. What changed: Another medication with the same name was added. Make sure you understand how and when to take each.   sodium chloride 1 g tablet Take 1 tablet (1 g total) by mouth 3 (three) times daily with meals. What changed: You were already taking a  medication with the same name, and this prescription was added. Make sure you understand how and when to take each.   tadalafil 5 MG tablet Commonly known as: CIALIS Take 1 tablet (5 mg total) by mouth daily as needed for erectile dysfunction.   traMADol 50 MG tablet Commonly known as: ULTRAM Take 50 mg by mouth every 8 (eight) hours as needed.       Allergies  Allergen Reactions   Lisinopril Rash   Varenicline Rash      The results of significant diagnostics from this hospitalization (including imaging, microbiology, ancillary and laboratory) are listed below for reference.    Significant Diagnostic Studies: No results found.  Microbiology: No results found for this or any previous visit (from the past 240 hour(s)).   Labs: Basic Metabolic Panel: Recent Labs  Lab 09/10/22 1759 09/11/22 0422 09/11/22 1001 09/11/22 1544 09/12/22 0451 09/13/22 0541  NA 118*  119* 120* 117* 122* 126* 132*  K 4.7 4.3 4.3  --  4.3 4.5  CL  86* 88* 86*  --  96* 97*  CO2 27 25 26   --  25 27  GLUCOSE 106* 85 125*  --  86 111*  BUN 7* 13 20  --  12 9  CREATININE 0.49* 0.40* 0.57*  --  0.57* 0.75  CALCIUM 8.9 8.5* 8.2*  --  8.6* 9.4   Liver Function Tests: Recent Labs  Lab 09/10/22 1759 09/11/22 0422 09/11/22 1001  AST 14* 15 17  ALT 10 9 9   ALKPHOS 79 71 70  BILITOT 0.4 0.5 0.5  PROT 6.8 6.0* 5.5*  ALBUMIN 3.7 3.3* 3.1*   No results for input(s): "LIPASE", "AMYLASE" in the last 168 hours. No results for input(s): "AMMONIA" in the last 168 hours. CBC: Recent Labs  Lab 09/10/22 1759 09/11/22 0422  WBC 5.8 5.4  NEUTROABS 4.7  --   HGB 12.2* 12.1*  HCT 33.8* 32.6*  MCV 87.6 87.4  PLT 193 188   Cardiac Enzymes: No results for input(s): "CKTOTAL", "CKMB", "CKMBINDEX", "TROPONINI" in the last 168 hours. BNP: BNP (last 3 results) No results for input(s): "BNP" in the last 8760 hours.  ProBNP (last 3 results) No results for input(s): "PROBNP" in the last 8760  hours.  CBG: No results for input(s): "GLUCAP" in the last 168 hours.     Signed:  Desma Maxim MD.  [facility] 09/13/2022, 12:13 PM

## 2022-09-13 NOTE — Progress Notes (Signed)
Pt a/o x 4, VS stable. Discharge orders are in. AVS printed and give to the pt, education provided. Wife at the bedside. IV removed and intact. Pt transferred off the floor. Belongings with the patient.

## 2022-09-13 NOTE — Progress Notes (Addendum)
[facility]  ROUNDING NOTE   Subjective:   Paul Carpenter is a 66 year old male with medical conditions including hypertension, hyperlipidemia, depression and anxiety, COPD, and left-sided non-small cell carcinoma postradiation and completed chemotherapy.  Patient presents from [facility] due to hyponatremia.  Patient admitted under observation for Chronic hyponatremia [E87.1] Hyponatremia [E87.1] Generalized weakness [R53.1]  Patient is known to our clinic and receives outpatient follow up with Dr Candiss Norse.    Resting in bed Tolerating meals, denies nausea and vomiting No lower extremity edema  Sodium 132  Objective:  Vital signs in last 24 hours:  Temp:  [98 F (36.7 C)-99.2 F (37.3 C)] 98.5 F (36.9 C) (11/13 1216) Pulse Rate:  [78-91] 78 (11/13 1216) Resp:  [18-20] 18 (11/13 1216) BP: (112-118)/(69-79) 113/78 (11/13 1216) SpO2:  [94 %-98 %] 98 % (11/13 1216) Weight:  [74.9 kg] 74.9 kg (11/13 0603)  Weight change:  Filed Weights   09/11/22 0127 09/13/22 0603  Weight: 77.5 kg 74.9 kg    Intake/Output: No intake/output data recorded.   Intake/Output this shift:  Total I/O In: 120 [P.O.:120] Out: -   Physical Exam: General: NAD  Head: Normocephalic, atraumatic. Moist oral mucosal membranes  Eyes: Anicteric  Lungs:  Clear to auscultation, normal effort, room air  Heart: Regular rate and rhythm  Abdomen:  Soft, nontender  Extremities: No peripheral edema.  Neurologic: Nonfocal, moving all four extremities  Skin: No lesions  Access: None    Basic Metabolic Panel: Recent Labs  Lab 09/10/22 1759 09/11/22 0422 09/11/22 1001 09/11/22 1544 09/12/22 0451 09/13/22 0541  NA 118*  119* 120* 117* 122* 126* 132*  K 4.7 4.3 4.3  --  4.3 4.5  CL 86* 88* 86*  --  96* 97*  CO2 27 25 26   --  25 27  GLUCOSE 106* 85 125*  --  86 111*  BUN 7* 13 20  --  12 9  CREATININE 0.49* 0.40* 0.57*  --  0.57* 0.75  CALCIUM 8.9 8.5* 8.2*  --  8.6* 9.4      Liver Function Tests: Recent Labs  Lab 09/10/22 1759 09/11/22 0422 09/11/22 1001  AST 14* 15 17  ALT 10 9 9   ALKPHOS 79 71 70  BILITOT 0.4 0.5 0.5  PROT 6.8 6.0* 5.5*  ALBUMIN 3.7 3.3* 3.1*    No results for input(s): "LIPASE", "AMYLASE" in the last 168 hours. No results for input(s): "AMMONIA" in the last 168 hours.  CBC: Recent Labs  Lab 09/10/22 1759 09/11/22 0422  WBC 5.8 5.4  NEUTROABS 4.7  --   HGB 12.2* 12.1*  HCT 33.8* 32.6*  MCV 87.6 87.4  PLT 193 188     Cardiac Enzymes: No results for input(s): "CKTOTAL", "CKMB", "CKMBINDEX", "TROPONINI" in the last 168 hours.  BNP: Invalid input(s): "POCBNP"  CBG: No results for input(s): "GLUCAP" in the last 168 hours.  Microbiology: Results for orders placed or performed during the hospital encounter of 08/06/22  MRSA Next Gen by PCR, Nasal     Status: None   Collection Time: 08/07/22  1:08 AM   Specimen: Nasal Mucosa; Nasal Swab  Result Value Ref Range Status   MRSA by PCR Next Gen NOT DETECTED NOT DETECTED Final    Comment: (NOTE) The GeneXpert MRSA Assay (FDA approved for NASAL specimens only), is one component of a comprehensive MRSA colonization surveillance program. It is not intended to diagnose MRSA infection nor to guide or monitor treatment for MRSA infections. Test performance  is not FDA approved in patients less than 46 years old. Performed at [facility], [address] [place], [place] [address]     Coagulation Studies: No results for input(s): "LABPROT", "INR" in the last 72 hours.  Urinalysis: No results for input(s): "COLORURINE", "LABSPEC", "PHURINE", "GLUCOSEU", "HGBUR", "BILIRUBINUR", "KETONESUR", "PROTEINUR", "UROBILINOGEN", "NITRITE", "LEUKOCYTESUR" in the last 72 hours.  Invalid input(s): "APPERANCEUR"    Imaging: No results found.   Medications:     atorvastatin  10 mg Oral QPM   azithromycin  500 mg Oral Daily   budesonide (PULMICORT) nebulizer  solution  0.25 mg Nebulization Daily   And   umeclidinium-vilanterol  1 puff Inhalation Daily   dronabinol  5 mg Oral BID AC   enoxaparin (LOVENOX) injection  40 mg Subcutaneous Q24H   ethambutol  1,200 mg Oral Daily   And   ethambutol  100 mg Oral Daily   ferrous sulfate  325 mg Oral QHS   gabapentin  100 mg Oral QHS   levothyroxine  200 mcg Oral Q0600   magnesium oxide  200 mg Oral TID AC   nicotine  21 mg Transdermal Daily   rifampin  600 mg Oral Daily   sodium chloride flush  3 mL Intravenous Q12H   acetaminophen **OR** acetaminophen, diazepam, HYDROcodone-acetaminophen, morphine injection  Assessment/ Plan:  [name] is a 66 y.o.  male is a 66 year old male with medical conditions including hypertension, hyperlipidemia, depression and anxiety, COPD, and left-sided non-small cell carcinoma postradiation and completed chemotherapy.  Patient presents from [facility] due to hyponatremia.  Patient admitted under observation for Chronic hyponatremia [E87.1] Hyponatremia [E87.1] Generalized weakness [R53.1]   Hyponatremia likely due to SIADH, patient with history of lung cancer.  Patient has been previously treated with tolvaptan, salt tabs, and fluid restriction during previous admissions.  Patient has received two doses of of Tolvaptan and sodium corrected to 132. Patient cleared to discharge from renal stance and continue follow up in office.     LOS: 2 Colon Flattery 11/13/202312:54 PM   Patient was examined and evaluated with Colon Flattery, NP.  Plan of care was formulated and discussed with patient as well as NP.  I agree with the note as documented.  Advised patient to follow fluid restriction of about a liter a day.  He can have other drinks containing high protein content such as Ensure, smoothies etc.  Follow-up with oncology for labs.

## 2022-09-14 ENCOUNTER — Encounter: Payer: Self-pay | Admitting: Pulmonary Disease

## 2022-09-14 ENCOUNTER — Ambulatory Visit (INDEPENDENT_AMBULATORY_CARE_PROVIDER_SITE_OTHER): Payer: Medicare Other | Admitting: Pulmonary Disease

## 2022-09-14 VITALS — BP 112/60 | HR 94 | Temp 98.2°F | Ht 72.0 in | Wt 172.6 lb

## 2022-09-14 DIAGNOSIS — A310 Pulmonary mycobacterial infection: Secondary | ICD-10-CM

## 2022-09-14 DIAGNOSIS — F17210 Nicotine dependence, cigarettes, uncomplicated: Secondary | ICD-10-CM

## 2022-09-14 DIAGNOSIS — C3491 Malignant neoplasm of unspecified part of right bronchus or lung: Secondary | ICD-10-CM

## 2022-09-14 DIAGNOSIS — J449 Chronic obstructive pulmonary disease, unspecified: Secondary | ICD-10-CM

## 2022-09-14 NOTE — Patient Instructions (Signed)
Continue your MAI antibiotics as you are doing.   We will see you in follow-up in 4 to 6 weeks time call sooner should any new problems arise.

## 2022-09-14 NOTE — Progress Notes (Signed)
Subjective:    Patient ID: Paul Carpenter, male    DOB: 06/28/1956, 66 y.o.   MRN: [id number] Patient Care Team: [name], MD as PCP - General (Internal Medicine) [name], MD as Referring Physician (Vascular Surgery) [name], MD as Consulting Physician (Oncology) [name], MD as Referring Physician (Radiation Oncology) Tyler Pita, MD as Consulting Physician (Pulmonary Disease) Tsosie Billing, MD as Consulting Physician (Infectious Diseases)  Chief Complaint  Patient presents with   Follow-up    SOB and wheezing constant. Cough with green sputum.   HPI Patient is a 66 year old current smoker (2 PPD, 150 PY) with a history as noted below who presents for follow-up of a cavitary right upper lobe process in the setting of history of non-small cell carcinoma of the lung, with recent recurrence.  The patient's primary pulmonologist is Dr. [name].  Patient was last seen by Dr. [name] on 19 November 2021 at that time the patient had been noted to have recurrence of stage IIIa non-small cell lung cancer of the right middle lobe.  It was suspicion of progression of disease on his most recent PET/CT of 16 November 2021.  Patient at that time could not undergo procedure such as bronchoscopy due to recent C2 cervical fracture.  On the sixth of April 2023 he was admitted for observation due to acute on chronic hypoxemic respiratory failure, at that time patient was having issues with fever up to 102.4 and purulent sputum production.  A CT of the chest at that time was performed to assess for PE.  This showed progressive multi cavitary lesion occupying a good portion of the right upper lobe and enlarging right infrahilar mediastinal lymph nodes and several small solid pulmonary nodules.  There was also patchy nodular airspace disease with surrounding tree-in-bud appearance (this has been present since as far back as 2016) there are also severe underlying  emphysematous changes.  Because of these findings MAC was suspected and infectious disease and pulmonary disease consultants were asked to render opinion.  The patient was seen by Dr. [name] and by Dr. [name].  The patient requested early discharge home and was sent home on Augmentin and azithromycin for suspected community-acquired pneumonia and possible MAC.  Patient was discharged the following day.  He presents today for consideration of bronchoscopy.  He informs me that he is still coughing copious amounts of thick green sputum.  Continues to have issues with malaise and right-sided pleuritic chest pain.  Since his discharge microbiology has been resulted and the patient is growing Pseudomonas aeruginosa.  The patient was treated for Pseudomonas aeruginosa and improved somewhat.  He then had recurrence of cough with sputum production after that therapy and sputum was recollected this time showing M avium complex.  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  He had recently he had a third agent added to his MAC regimen.  He was admitted on 10 November with significant hyponatremia, he was discharged just yesterday.  He presents today for follow-up this was a scheduled appointment.  He had a CT chest 06 August 2022 which is stent with MAI infection and previously treated cancer.  His right upper lobe cavitary lesion has not really changed.  He continues to have chronic weakness and fatigue, chronic cough, and persistent shortness of breath.  Has significant COPD and emphysema.  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  He continues to  be followed closely by both oncology and infectious disease.  He denies any hemoptysis. He has no neurologic complaints.  He denies any fevers. He denies any nausea, vomiting, constipation, or diarrhea. He has no urinary complaints.  Patient offers no new complaints today.     Carrolyn Meiers the  patient continues to smoke, he does wear oxygen nocturnally but has been noncompliant with CPAP for obstructive sleep apnea.  He does have nocturnal oxygen desaturations per his wife.  He is on [employer] and albuterol nebs as needed.    We discussed the findings on his recent CT of the chest performed 03 August 2022.  Reiterated that there is no need for bronchoscopy at this time.  Findings consistent with MAI infection.  Review of Systems A 10 point review of systems was performed and it is as noted above otherwise negative.  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/22/2021   Overweight (BMI 25.0-29.9) 09/22/2021   Hypomagnesemia 09/21/2021   Elevated hemoglobin A1c 06/19/2020   Hypothyroidism, acquired 05/30/2019   Squamous cell lung cancer, right ([place]) 05/30/2019   HFrEF (heart failure with reduced ejection fraction) ([place]) 03/06/2019   Atherosclerosis 12/14/2018   Non-small cell lung cancer, right ([place]) 04/24/2018   Oral ulcer 02/16/2018   Pituitary disorder ([place]) 02/16/2018   Migraine headache 03/07/2017   HCAP (healthcare-associated pneumonia) 09/11/2016   COPD exacerbation ([place]) 09/11/2016   Chronic hyponatremia 09/11/2016   Leukocytosis 09/11/2016   Thrombocytopenia ([place]) 09/11/2016   Cigarette smoker 06/11/2016   Cervical radiculopathy 04/15/2016   Cervical disc disorder at C5-C6 level with radiculopathy 03/09/2016   Impingement syndrome of right shoulder 03/09/2016   Health care maintenance 09/29/2015   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   Benign essential hypertension 05/28/2015   Polycythemia 03/24/2015   Carotid artery disease ([place]) 12/12/2014   Disequilibrium 12/12/2014   Mixed hyperlipidemia 12/10/2014   Incomplete emptying of bladder 06/04/2014   Anxiety 05/18/2014   Chronic coronary artery disease  05/18/2014   Chronic headaches 05/18/2014   Acute shoulder pain 03/15/2014   Impingement syndrome of left shoulder 03/15/2014   Lung mass 12/06/2013   Kidney stone 11/24/2013   COPD (chronic obstructive pulmonary disease) ([place]) 04/24/2013   Obstructive sleep apnea 04/24/2013   Benign localized prostatic hyperplasia with lower urinary tract symptoms (LUTS) 07/04/2012   Encounter for long-term current use of medication 07/04/2012   ED (erectile dysfunction) of organic origin 07/04/2012   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   albuterol (PROVENTIL) (2.5 MG/3ML) 0.083% nebulizer solution Take 3 mLs (2.5 mg total) by nebulization 4 (four) times daily as needed for wheezing or shortness of breath.   albuterol (VENTOLIN HFA)  108 (90 Base) MCG/ACT inhaler INHALE 2 PUFFS INTO THE LUNGS EVERY 4 HOURS AS NEEDED FOR WHEEZING OR SHORTNESS OF BREATH.   atorvastatin (LIPITOR) 10 MG tablet Take 10 mg by mouth daily.   azithromycin (ZITHROMAX) 500 MG tablet Take 500 mg by mouth daily.   Budeson-Glycopyrrol-Formoterol (BREZTRI AEROSPHERE) 160-9-4.8 MCG/ACT AERO Inhale 2 puffs into the lungs in the morning and at bedtime.   diazepam (VALIUM) 5 MG tablet Take 5 mg by mouth at bedtime as needed.   dronabinol (MARINOL) 5 MG capsule Take 1 capsule (5 mg total) by mouth 2 (two) times daily before lunch and supper.   ethambutol (MYAMBUTOL) 400 MG tablet  Take 3 tablets (1,200 mg total) by mouth daily.   ferrous sulfate 325 (65 FE) MG tablet Take 325 mg by mouth at bedtime.   gabapentin (NEURONTIN) 100 MG capsule Take 1 capsule (100 mg total) by mouth at bedtime.   levothyroxine (SYNTHROID) 200 MCG tablet Take 200 mcg by mouth daily before breakfast.   magnesium oxide (MAG-OX) 400 MG tablet Take 600 mg by mouth 2 (two) times daily. Takes 1.5 tablet twice daily   mirtazapine (REMERON) 7.5 MG tablet Take 1 tablet (7.5 mg total) by mouth at bedtime.   nitroGLYCERIN (NITROSTAT) 0.4 MG SL tablet Place under the tongue.   OXYGEN Inhale 2 L into the lungs at bedtime.   pantoprazole (PROTONIX) 40 MG tablet Take 1 tablet (40 mg total) by mouth 2 (two) times daily before meals   rifampin (RIFADIN) 300 MG capsule Take 2 capsules (600 mg total) by mouth daily.   sodium chloride 1 g tablet Take 1 tablet (1 g total) by mouth 3 (three) times daily with meals.   sodium chloride 1 g tablet Take 1 tablet (1 g total) by mouth 3 (three) times daily with meals.   tadalafil (CIALIS) 5 MG tablet Take 1 tablet (5 mg total) by mouth daily as needed for erectile dysfunction.   Immunization History  Administered Date(s) Administered   Fluad Quad(high Dose 65+) 09/22/2020   Influenza Inj Mdck Quad Pf 09/06/2016, 10/02/2019, 09/09/2021, 09/03/2022   Influenza Split 08/05/2013, 07/23/2014, 08/18/2015   Influenza Whole 08/01/2012   Influenza,inj,Quad PF,6+ Mos 08/14/2018   Influenza-Unspecified 09/06/2016, 08/17/2017, 09/09/2021   Zoster Recombinat (Shingrix) 06/19/2020       Objective:   Physical Exam BP 112/60 (BP Location: Left Arm, Cuff Size: Normal)   Pulse 94   Temp 98.2 F (36.8 C)   Ht 6' (1.829 m)   Wt 172 lb 9.6 oz (78.3 kg)   SpO2 96%   BMI 23.41 kg/m  GENERAL: Debilitated appearing gentleman, ashen color, well nourished.  Mild tachypnea at rest.  Fully ambulatory.  No conversational dyspnea. HEAD: Normocephalic, atraumatic.  EYES: Pupils equal,  round, reactive to light.  No scleral icterus.  MOUTH: Oral mucosa moist.  No thrush. NECK: Supple. No thyromegaly. Trachea midline. No JVD.  No adenopathy. PULMONARY: Good air entry bilaterally.  Loud rhonchi throughout particularly at the right base.  No wheezes. CARDIOVASCULAR: S1 and S2. Regular rate and rhythm.  No rubs, murmurs or gallops heard. ABDOMEN: Benign. MUSCULOSKELETAL: No joint deformity, no clubbing, no edema.  NEUROLOGIC: Mild psychomotor retardation.  No overt focal deficit.  No gait disturbance.  Speech is fluent. SKIN: Intact,warm,dry. PSYCH: Depressed mood and affect.       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 [name]    2. Squamous cell lung cancer, right ([facility])  C34.91    Currently no evidence of recurrence Findings on CT more compatible with MAI infection    3. Chronic obstructive pulmonary disease, unspecified COPD type ([facility])  J44.9    Continue Breztri 2 puffs twice a day Continue needed albuterol    4. Tobacco dependence due to cigarettes  F17.210    Patient counseled regards discontinuation of smoking Unfortunately patient not motivated to quit Issue adds complexity to his management     We will see the patient in follow-up in 4 to 6 weeks time he is to contact us prior to that time should any new difficulties arise.  Renold Don, MD Advanced Bronchoscopy PCCM [facility]    *This note was dictated using voice recognition software/Dragon.  Despite best efforts to proofread, errors can occur which can change the meaning. Any transcriptional errors that result from this process are unintentional and may not be fully corrected at the time of dictation.

## 2022-09-16 ENCOUNTER — Other Ambulatory Visit: Payer: Self-pay

## 2022-09-16 MED ORDER — RIFAMPIN 300 MG PO CAPS: 600.0000 mg | ORAL_CAPSULE | Freq: Every day | ORAL | 1 refills | Status: DC

## 2022-10-01 ENCOUNTER — Telehealth: Payer: Self-pay | Admitting: *Deleted

## 2022-10-01 ENCOUNTER — Telehealth: Payer: Self-pay | Admitting: Pulmonary Disease

## 2022-10-01 NOTE — Telephone Encounter (Signed)
Pt called the office stating that he spoke with his oncologist and he stated that for the past two weeks, he has been having problems feeling cold all the time but also states that he has been breaking out in a sweat in the face.  Also states that he has been having pain in his shoulder area and in back which they stated was signs of possible lung cancer. Pt wants to know if the cancer might've spread, could that be what is causing his symptoms or if the MAC has gotten worse as well.  Pt is also coughing getting up some phlegm that is green in color and also states that he has had problems with SOB.  Pt wants to know what might be able to be recommended due to this. Dr. Darnell Level, please advise.

## 2022-10-01 NOTE — Telephone Encounter (Signed)
This has been a chronic complaint for this patient.  He does have MAC which is being treated by infectious diseases.  I saw him in the clinic on the 14 November and he had just been released from the hospital for a separate issue.  He did not have any these complaints then.  Recent scan in October did not show spread of the cancer.  The sweats and feeling cold may be symptoms of a separate infectious process.  He should be evaluated in the emergency room given that he has complaints of pain which are not related to his MAC issues.

## 2022-10-01 NOTE — Telephone Encounter (Signed)
Patient called to report that he is having increased pain in his shoulders, increased coughing, diaphoresis on his head and face. He has recently been diagnosed with MAC. His worry is that there has been a change in his cancer. He has an appointment with Patsey Berthold 11/04/22 and with [name] on 11/09/22. He would like to speak with Dr. Grayland Ormond or [facility].

## 2022-10-01 NOTE — Telephone Encounter (Signed)
Patient is aware of below message and voiced his understanding.  He will present to ED. Nothing further needed.

## 2022-10-01 NOTE — Telephone Encounter (Signed)
Patient called to report that he has been experiencing increased shoulder pain, increase in coughing and has been diaphoretic in his face and head only. He also stated that he has been diagnosed with MAC.  His worry is that something new is going on with his lung  cancer

## 2022-10-01 NOTE — Telephone Encounter (Signed)
Case discussed with Dr. Grayland Ormond and I spoke with patient by phone.  Suspect that patient's symptoms are related to MAC infection.  Patient to have follow-up with pulmonary.

## 2022-10-02 ENCOUNTER — Other Ambulatory Visit: Payer: Self-pay | Admitting: Pulmonary Disease

## 2022-10-02 ENCOUNTER — Telehealth: Payer: Self-pay | Admitting: Home Health

## 2022-10-02 NOTE — Telephone Encounter (Signed)
Wife called after hour line, reporting she needs to get hold of pulmonology office, we have  not seeing this patient in the past, advised the wife to call his pulmonology office.

## 2022-10-04 ENCOUNTER — Telehealth: Payer: Self-pay

## 2022-10-04 ENCOUNTER — Telehealth: Payer: Self-pay | Admitting: Pulmonary Disease

## 2022-10-04 NOTE — Telephone Encounter (Signed)
Called and spoke to patient's spouse, Shirlean Mylar. She stated that patient is scheduled to see ID tomorrow. She would like a stat CT ordered to check on MAC prior to tomorrows visit.  Dr. Patsey Berthold, please advise. Thanks

## 2022-10-04 NOTE — Telephone Encounter (Signed)
Patient's spouse, Robin(DPR) is aware of below message/recommendations and voiced her understanding. Nothing further needed.

## 2022-10-04 NOTE — Telephone Encounter (Signed)
He will have to be evaluated by Dr. [name] tomorrow and then determine if the CT is needed.  He just had 1 in October and that was did not show any worsening.

## 2022-10-04 NOTE — Telephone Encounter (Signed)
Patient wife called requesting appointment to see Dr.Ravishankar this week. Patient is not feeling well and they didn't want to go to ED. Patient scheduled to see Dr.Ravishankar on 12/5 at 8:30 AM.    Tomi Bamberger, CMA

## 2022-10-05 ENCOUNTER — Ambulatory Visit: Payer: Medicare Other | Attending: Infectious Diseases | Admitting: Infectious Diseases

## 2022-10-05 ENCOUNTER — Encounter: Payer: Self-pay | Admitting: Infectious Diseases

## 2022-10-05 VITALS — BP 144/74 | HR 100 | Temp 97.8°F | Ht 72.0 in | Wt 170.0 lb

## 2022-10-05 DIAGNOSIS — Z85118 Personal history of other malignant neoplasm of bronchus and lung: Secondary | ICD-10-CM | POA: Diagnosis not present

## 2022-10-05 DIAGNOSIS — I110 Hypertensive heart disease with heart failure: Secondary | ICD-10-CM | POA: Diagnosis not present

## 2022-10-05 DIAGNOSIS — Z7952 Long term (current) use of systemic steroids: Secondary | ICD-10-CM | POA: Insufficient documentation

## 2022-10-05 DIAGNOSIS — E785 Hyperlipidemia, unspecified: Secondary | ICD-10-CM | POA: Diagnosis not present

## 2022-10-05 DIAGNOSIS — E039 Hypothyroidism, unspecified: Secondary | ICD-10-CM | POA: Diagnosis not present

## 2022-10-05 DIAGNOSIS — Z923 Personal history of irradiation: Secondary | ICD-10-CM | POA: Diagnosis not present

## 2022-10-05 DIAGNOSIS — Z79890 Hormone replacement therapy (postmenopausal): Secondary | ICD-10-CM | POA: Insufficient documentation

## 2022-10-05 DIAGNOSIS — F17210 Nicotine dependence, cigarettes, uncomplicated: Secondary | ICD-10-CM | POA: Diagnosis not present

## 2022-10-05 DIAGNOSIS — Z9221 Personal history of antineoplastic chemotherapy: Secondary | ICD-10-CM | POA: Diagnosis not present

## 2022-10-05 DIAGNOSIS — I509 Heart failure, unspecified: Secondary | ICD-10-CM | POA: Insufficient documentation

## 2022-10-05 DIAGNOSIS — J4489 Other specified chronic obstructive pulmonary disease: Secondary | ICD-10-CM | POA: Insufficient documentation

## 2022-10-05 DIAGNOSIS — A310 Pulmonary mycobacterial infection: Secondary | ICD-10-CM | POA: Diagnosis present

## 2022-10-05 DIAGNOSIS — E222 Syndrome of inappropriate secretion of antidiuretic hormone: Secondary | ICD-10-CM | POA: Insufficient documentation

## 2022-10-05 MED ORDER — NYSTATIN 100000 UNIT/ML MT SUSP: 5.0000 mL | Freq: Four times a day (QID) | OROMUCOSAL | 0 refills | Status: DC

## 2022-10-05 NOTE — Progress Notes (Signed)
NAME: Paul Carpenter  DOB: 1956/05/01  MRN: [id number]  Date/Time: 10/05/2022 8:49 AM  Subjective:  Paul Carpenter is a 66 y.o. 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 No documented fever Some baseline cough which is slightly increased HE saw his Pcp yesterday for resp symptoms and covid flu negative, Cmp N, CBC has a wbc of 11 Prednisone was extended HE wanted to check with me to see whether he should continue taking MAC treatment- also wanted to know whether he should get CT scan of his chest. He wanted to know whether MAC is getting better?  Cavitary lung lesions rt upper lobe being treated for MAC with h/o underlying ca lung s/p radiation X 2 and chemo On Ethambutol and azithromycin and rifampin  100% adherent   Smoking 2 packs/day- cut down from 3  He has not had any alcohol for the past month HE was recently hospitalized in Nov  for hyponatremia SIADH , now on salt tabs and magnesium and Na has normalized Also synthroid increased   Recent labs normal LFTS/cr  Following taken from last note  Patient has a complicated lung history As per Dr. [name] note he was initially diagnosed with non-small cell carcinoma of the lung in 2019. On 04/11/2018 a bronch with EBUS done and biopsy taken of subcarinal mass- it showed  - POSITIVE FOR MALIGNANT CELLS IN A NECROTIC BACKGROUND.  MRI of the brain completed on Mar 28, 2018 did not reveal any metastatic disease.  He completed radiation June 26, 2018.  He completed concurrent single agent carboplatinum on June 21, 2018.  He had a reaction to  Taxol during cycle 1 and this was discontinued.  He completed a year-long maintenance subdural level lab on June 27, 2019.  In July 2022 there was a concern for slight progression of the disease.  PET scan done on 06/03/2021 revealed a spiculated cavitary mass in the apical segment of the right upper lobe with similar SUV which was 3.5 versus 3.31-year ago.  So this was reassuring as per the radiologist.  However disease progression could not be definitely excluded.   no metastasis seen.  A CT scan done on 07/20/2021 showed continued slow evolution of a thick-walled cavitary mass in the right upper lobe which remains concerning for slow-growing neoplasm.  He had a fall and fractured his C 2 in December , 2022.  He was in [facility] at that time.  CTs chest that was done that showed a large right upper lobe soft tissue mass with similar abnormal tissue involving the right pulmonary hilum extending medially into the ipsilateral mediastinum in keeping with a reported history of primary lung cancer. In January he was hospitalized at [facility] for hyponatremia.  At that time oncologist saw him and got another PET scan.  A bronchoscopy/biopsy could not be done due to C2 fracture.  A PET scan was done on 11/16/2021 and it showed similar size of the cavitary process in the right upper lobe.  Increased size and hypermetabolism compared to the PET on 06/02/2021.  Hypermetabolic pulmonary nodules many of which were new compared to the CAT  scan of 10/20/2021.    He was diagnosed to have recurrence and was started on palliative chemo with  taxotere( 4 cycles) on 11/26/21- completed on 01/28/22 He was hospitalized  02/03/22-02/05/22 for sob and cough and fever and sputum sent which was pseudomonas, but realized after his discharge He saw Dr.[name] on 02/09/22 and received a 2 week course of cipro for necrotizing pneumonia with pseudomonas. A repeat PET scan done on 02/23/22 showed Thick-walled cavitary mass in the right upper lobe has  decreased in overall hypermetabolism, indicative of treatment response. 2. Multifocal areas of nodular consolidation and peribronchovascular nodularity with a waxing and waning appearance, suggesting combination slight improvement in metastatic disease with a superimposed atypical/fungal infectious process. 3. Mildly hypermetabolic low right paratracheal and right hilar lymph nodes, metastatic or reactive. 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 and he was referred to me for treatment AFB from Aug positive for MAC Not been on any further treatment for lung cancer Followed by Dr.[name] and Dr.[name]  Past Medical History:  Diagnosis Date   Anginal pain ([place])    Anxiety    Asthma    Chest pain    CHF (congestive heart failure) (H[facility])    Chicken pox    Complication of anesthesia    o2 dropped after neck fusion   COPD (chronic obstructive pulmonary disease) ([place])    Coronary artery disease    Cough    chronic  clear phlegm   Dysrhythmia    palpitations   GERD (gastroesophageal reflux disease)    h/o reflux/ hoarsness   Hematochezia    Hemorrhoids    History of chickenpox    History of colon polyps    History of Helicobacter pylori infection    Hoarseness    Hypertension    Lung cancer ([place]) 05/2016   Chemo + rad tx's.    Migraines    OSA (obstructive sleep apnea)    has CPAP but does not use   Personal history of tobacco use, presenting hazards to health 03/05/2016   Pneumonia    5/17   Raynaud disease    Raynaud disease    Raynaud's disease    Rotator cuff tear    on right   Shortness of breath dyspnea    Sleep apnea    Ulcer (traumatic) of oral mucosa     Past Surgical History:  Procedure Laterality Date   BACK SURGERY     cervical fusion x 2   CARDIAC CATHETERIZATION     CERVICAL DISCECTOMY     x 2   COLONOSCOPY     COLONOSCOPY N/A 07/25/2015   Procedure: COLONOSCOPY;  Surgeon: Lollie Sails, MD;  Location: [facility] ENDOSCOPY;  Service:  Endoscopy;  Laterality: N/A;   COLONOSCOPY WITH PROPOFOL N/A 10/04/2017   Procedure: COLONOSCOPY WITH PROPOFOL;  Surgeon: Lollie Sails, MD;  Location: [facility] ENDOSCOPY;  Service: Endoscopy;  Laterality: N/A;   COLONOSCOPY WITH PROPOFOL N/A 07/10/2020   Procedure: COLONOSCOPY WITH PROPOFOL;  Surgeon: Toledo, Benay Pike, MD;  Location: [facility] ENDOSCOPY;  Service: Gastroenterology;  Laterality: N/A;   ELECTROMAGNETIC NAVIGATION BROCHOSCOPY Left 06/28/2016   Procedure: ELECTROMAGNETIC NAVIGATION BRONCHOSCOPY;  Surgeon: Vilinda Boehringer, MD;  Location: [facility] ORS;  Service: Cardiopulmonary;  Laterality: Left;   ENDOBRONCHIAL ULTRASOUND N/A 04/11/2018   Procedure: ENDOBRONCHIAL ULTRASOUND;  Surgeon: [name], MD;  Location: [facility] ORS;  Service: Cardiopulmonary;  Laterality: N/A;   ESOPHAGOGASTRODUODENOSCOPY N/A 07/25/2015   Procedure: ESOPHAGOGASTRODUODENOSCOPY (EGD);  Surgeon: Lollie Sails, MD;  Location: [facility] ENDOSCOPY;  Service: Endoscopy;  Laterality: N/A;   ESOPHAGOGASTRODUODENOSCOPY (EGD) WITH PROPOFOL N/A 07/10/2020   Procedure: ESOPHAGOGASTRODUODENOSCOPY (EGD) WITH PROPOFOL;  Surgeon: Toledo, Benay Pike, MD;  Location: [facility] ENDOSCOPY;  Service: Gastroenterology;  Laterality: N/A;   NASAL SINUS SURGERY     x 2    PORTA CATH INSERTION N/A 04/24/2018   Procedure: PORTA CATH INSERTION;  Surgeon: Algernon Huxley, MD;  Location: [place] [facility];  Service: Cardiovascular;  Laterality: N/A;   rotator cuff surgery Right    07/2016   SEPTOPLASTY     SKIN GRAFT      Social History   Socioeconomic History   Marital status: Married    Spouse name: Not on file   Number of children: 3   Years of education: Not on file   Highest education level: Not on file  Occupational History   Occupation: Architect Work  Tobacco Use   Smoking status: Every Day    Packs/day: 3.00    Years: 50.00    Total pack years: 150.00    Types: Cigarettes   Smokeless tobacco: Never   Tobacco comments:    2 PPD  09/14/2022 khj  Vaping Use   Vaping Use: Never used  Substance and Sexual Activity   Alcohol use: Yes    Alcohol/week: 2.0 standard drinks of alcohol    Types: 2 Standard drinks or equivalent per week    Comment: moderate   Drug use: No   Sexual activity: Not on file  Other Topics Concern   Not on file  Social History Narrative   Not on file   Social Determinants of Health   Financial Resource Strain: Low Risk  (07/22/2022)   Overall Financial Resource Strain (CARDIA)    Difficulty of Paying Living Expenses: Not very hard  Food Insecurity: No Food Insecurity (09/11/2022)   Hunger Vital Sign    Worried About Running Out of Food in the Last Year: Never true    Ran Out of Food in the Last Year: Never true  Transportation Needs: No Transportation Needs (09/11/2022)   PRAPARE - Hydrologist (Medical): No    Lack of Transportation (Non-Medical): No  Physical Activity: Inactive (07/22/2022)   Exercise Vital Sign    Days of Exercise per Week: 0 days    Minutes of Exercise per Session: 0 min  Stress: Stress Concern Present (07/22/2022)   [place]    Feeling of Stress : Rather much  Social Connections: Moderately Integrated (07/22/2022)   Social Connection and Isolation Panel [NHANES]    Frequency of Communication with Friends and Family: Twice a week    Frequency of Social Gatherings with Friends and Family: Twice a week    Attends Religious Services: 1 to 4 times per year    Active Member of [employer] or Organizations: No    Attends Music therapist: Not on file    Marital Status: Married  Human resources officer Violence: Not At Risk (09/11/2022)   Humiliation, Afraid, Rape, and Kick questionnaire    Fear of Current or Ex-Partner: No    Emotionally Abused: No    Physically Abused: No    Sexually Abused: No    Family History  Problem Relation Age of Onset   Heart disease Father     Prostate cancer Father    Heart disease Paternal Grandmother    Heart attack Maternal Grandfather 62   Kidney cancer  Neg Hx    Bladder Cancer Neg Hx    Other Neg Hx        pituitary abnormality   Allergies  Allergen Reactions   Lisinopril Rash   Varenicline Rash   I? Current Outpatient Medications  Medication Sig Dispense Refill   acetaminophen (TYLENOL) 500 MG tablet Take 500 mg by mouth daily as needed.     albuterol (PROVENTIL) (2.5 MG/3ML) 0.083% nebulizer solution TAKE 3 MLS (2.5 MG TOTAL) BY NEBULIZATION 4 (FOUR) TIMES DAILY AS NEEDED FOR WHEEZING OR SHORTNESS OF BREATH. 90 mL 2   albuterol (VENTOLIN HFA) 108 (90 Base) MCG/ACT inhaler INHALE 2 PUFFS INTO THE LUNGS EVERY 4 HOURS AS NEEDED FOR WHEEZING OR SHORTNESS OF BREATH. 8.5 each 2   atorvastatin (LIPITOR) 10 MG tablet Take 10 mg by mouth daily.     azithromycin (ZITHROMAX) 500 MG tablet Take 500 mg by mouth daily.     Budeson-Glycopyrrol-Formoterol (BREZTRI AEROSPHERE) 160-9-4.8 MCG/ACT AERO Inhale 2 puffs into the lungs in the morning and at bedtime. 5.9 g 0   diazepam (VALIUM) 5 MG tablet Take 5 mg by mouth at bedtime as needed.     dronabinol (MARINOL) 5 MG capsule Take 1 capsule (5 mg total) by mouth 2 (two) times daily before lunch and supper. 60 capsule 0   ethambutol (MYAMBUTOL) 400 MG tablet Take 3 tablets (1,200 mg total) by mouth daily. 90 tablet 3   ferrous sulfate 325 (65 FE) MG tablet Take 325 mg by mouth at bedtime.     gabapentin (NEURONTIN) 100 MG capsule Take 1 capsule (100 mg total) by mouth at bedtime. 30 capsule 3   levothyroxine (SYNTHROID) 200 MCG tablet Take 200 mcg by mouth daily before breakfast.     magnesium oxide (MAG-OX) 400 MG tablet Take 600 mg by mouth 2 (two) times daily. Takes 1.5 tablet twice daily     mirtazapine (REMERON) 7.5 MG tablet Take 1 tablet (7.5 mg total) by mouth at bedtime. 30 tablet 2   nitroGLYCERIN (NITROSTAT) 0.4 MG SL tablet Place under the tongue.     OXYGEN Inhale 2 L  into the lungs at bedtime.     pantoprazole (PROTONIX) 40 MG tablet Take 1 tablet (40 mg total) by mouth 2 (two) times daily before meals     predniSONE (DELTASONE) 10 MG tablet 3 tabs daily for 3 days, 2 tab daily for 3 days, 1 tab for 3 day     rifampin (RIFADIN) 300 MG capsule Take 2 capsules (600 mg total) by mouth daily. 60 capsule 1   sodium chloride 1 g tablet Take 1 tablet (1 g total) by mouth 3 (three) times daily with meals. 90 tablet 1   tadalafil (CIALIS) 5 MG tablet Take 1 tablet (5 mg total) by mouth daily as needed for erectile dysfunction. 90 tablet 3   No current facility-administered medications for this visit.     Abtx:  Anti-infectives (From admission, onward)    None       REVIEW OF SYSTEMS:  Const: as above Eyes: negative diplopia or visual changes, negative eye pain ENT: negative coryza, negative sore throat Resp: cough, , dyspnea rt sided pleuritic chest pain better than before Cards: negative for chest pain, palpitations, lower extremity edema GU: negative for frequency, dysuria and hematuria GI: poor appetite , weight loss Skin: negative for rash and pruritus Heme: negative for easy bruising and gum/nose bleeding MS: fatigue Neurolo:negative for headaches, dizziness, vertigo, memory problems  Psych:depression  Endocrine: negative  for thyroid, diabetes Allergy/Immunology-as above: Continues to smoke2 pack/day-  VITALS:  Ht 6' (1.829 m)   Wt 170 lb (77.1 kg)   BMI 23.06 kg/m   BP  temp  PHYSICAL EXAM:  General: Alert, cooperative, no  distress at rest,  Throat spotty candida Lungs: b/l air entry Rhonchi and crepts on rt side Heart: Regular rate and rhythm, no murmur, rub or gallop. Abdomen: did not examine Extremities: atraumatic, no cyanosis. No edema. No clubbing Skin: No rashes or lesions. Or bruising Lymph: Cervical, supraclavicular normal. Neurologic: Grossly non-focal  Labs    Latest Ref Rng & Units 09/11/2022    4:22 AM 09/10/2022     5:59 PM 08/11/2022    1:19 AM  CBC  WBC 4.0 - 10.5 K/uL 5.4  5.8  5.7   Hemoglobin 13.0 - 17.0 g/dL 12.1  12.2  13.1   Hematocrit 39.0 - 52.0 % 32.6  33.8  37.7   Platelets 150 - 400 K/uL 188  193  233        Latest Ref Rng & Units 09/13/2022    5:41 AM 09/12/2022    4:51 AM 09/11/2022    3:44 PM  CMP  Glucose 70 - 99 mg/dL 111  86    BUN 8 - 23 mg/dL 9  12    Creatinine 0.61 - 1.24 mg/dL 0.75  0.57    Sodium 135 - 145 mmol/L 132  126  122   Potassium 3.5 - 5.1 mmol/L 4.5  4.3    Chloride 98 - 111 mmol/L 97  96    CO2 22 - 32 mmol/L 27  25    Calcium 8.9 - 10.3 mg/dL 9.4  8.6      s  ? Impression/Recommendation 66 year-old male current smoker and alcohol use with  recurrent non-small cell CA lung on the right side.  In 2019 had undergone chemo, radiation and immunotherapy Since January 2023 he had taken 4 cycles of palliative chemotherapy. No chemo since the past 8 months No biopsy was taken the second time around because of C2 fracture  Neck cavitary/necrotizing mass on the right upper lobe/middle lobe Being treated for MAC - on azithromycin, ethambutol  since June 2023 and rifampin since sept 2023 I dont think his symptoms of cold, malaise and fatigue are due to side effects of the meds-  d.D adrenal insufficiency- recent labs were okay Yesterday labs CMP normal CBC Hb 12 Will send three sputums for afb- this will tell us whether he is responding to the past 4-6 months of MAC treatment Continue current MAC treatment   Hyperlipidemia on atorvastatin. With rifampin the level can decrease or increase- he has no stents- it may be okay to stop atorvastatin for a few weeks or reduce the dose to 79m  COPD  Hypothyroidism on Synthroid- recently increased   Hyponatremia due to SIADH- recently admitted  for that. On sodium tablets+ magnesium  Poor appetite- on marinol with not much help- also expensive prescription   Discussed with patient and his wife in  detail Follow up in Feb He is planning to cancel his urgent appt he made with Dr.[name] for Thursday as he ahs been seen both by PCP and me-  __________________________________________________

## 2022-10-05 NOTE — Patient Instructions (Addendum)
You are here for MAC lung infection- you have been having some symptoms of feeling cold but no fever or sweats or worsening sob or cough- we will get 3 sputums for AFB- continue rifampin+ Ethambutol+ azithromycin

## 2022-10-06 ENCOUNTER — Other Ambulatory Visit
Admission: RE | Admit: 2022-10-06 | Discharge: 2022-10-06 | Disposition: A | Discharge status: Home / Self Care | Payer: Medicare Other | Source: Ambulatory Visit | Attending: Infectious Diseases | Admitting: Infectious Diseases

## 2022-10-06 ENCOUNTER — Encounter: Payer: Self-pay | Admitting: Oncology

## 2022-10-06 DIAGNOSIS — A310 Pulmonary mycobacterial infection: Secondary | ICD-10-CM | POA: Diagnosis present

## 2022-10-06 LAB — EXPECTORATED SPUTUM ASSESSMENT W GRAM STAIN, RFLX TO RESP C

## 2022-10-07 ENCOUNTER — Other Ambulatory Visit: Payer: Self-pay

## 2022-10-07 ENCOUNTER — Other Ambulatory Visit
Admission: RE | Admit: 2022-10-07 | Discharge: 2022-10-07 | Disposition: A | Discharge status: Home / Self Care | Payer: Medicare Other | Source: Ambulatory Visit | Attending: Infectious Diseases | Admitting: Infectious Diseases

## 2022-10-07 DIAGNOSIS — A310 Pulmonary mycobacterial infection: Secondary | ICD-10-CM | POA: Insufficient documentation

## 2022-10-07 LAB — ACID FAST SMEAR (AFB, MYCOBACTERIA): Acid Fast Smear: NEGATIVE

## 2022-10-07 MED ORDER — DRONABINOL 5 MG PO CAPS: 5.0000 mg | ORAL_CAPSULE | Freq: Two times a day (BID) | ORAL | 0 refills | Status: DC

## 2022-10-07 NOTE — Telephone Encounter (Signed)
Patient called stating he needs a refill on his Dronabinol and needs prescription refill sent over to [facility] in [place].

## 2022-10-08 ENCOUNTER — Other Ambulatory Visit
Admission: RE | Admit: 2022-10-08 | Discharge: 2022-10-08 | Disposition: A | Discharge status: Home / Self Care | Payer: Medicare Other | Source: Ambulatory Visit | Attending: Infectious Diseases | Admitting: Infectious Diseases

## 2022-10-08 ENCOUNTER — Ambulatory Visit: Payer: Medicare Other | Admitting: Pulmonary Disease

## 2022-10-08 DIAGNOSIS — A310 Pulmonary mycobacterial infection: Secondary | ICD-10-CM | POA: Insufficient documentation

## 2022-10-08 LAB — CULTURE, RESPIRATORY W GRAM STAIN: Culture: NORMAL

## 2022-10-14 ENCOUNTER — Telehealth: Payer: Self-pay

## 2022-10-14 LAB — ACID FAST SMEAR (AFB, MYCOBACTERIA)
Acid Fast Smear: NEGATIVE
Acid Fast Smear: NEGATIVE

## 2022-10-14 NOTE — Telephone Encounter (Signed)
-----   Message from Tsosie Billing, MD sent at 10/14/2022 12:30 PM EST ----- Please ignore the other message- HE did give 3 sputums for Afb and the smear is negative in all three- which is great news- culture pending- will take 6 weeks- The regular bacterial culture was negative too. Please let know know that the meds are workign and he needs to continue taking them.thx  ----- Message ----- From: Buel Ream, [facility] [place] Sent: 10/14/2022   9:37 AM EST To: Tsosie Billing, MD

## 2022-10-14 NOTE — Telephone Encounter (Signed)
Patient and patient's wife informed of lab results  and his medication is working.  Patient also advised that his culture will take 6 weeks to come back. Paul Carpenter [place] [name]

## 2022-10-17 ENCOUNTER — Other Ambulatory Visit: Payer: Self-pay | Admitting: Urology

## 2022-10-18 ENCOUNTER — Telehealth: Payer: Self-pay

## 2022-10-18 MED ORDER — NYSTATIN 100000 UNIT/ML MT SUSP: 5.0000 mL | Freq: Four times a day (QID) | OROMUCOSAL | 0 refills | Status: DC

## 2022-10-18 NOTE — Telephone Encounter (Signed)
Patient calling to request refill for nystatin (MYCOSTATIN) 100000 UNIT/ML suspension. Routing message to provider for approval.  Eugenia Mcalpine, LPN

## 2022-10-18 NOTE — Addendum Note (Signed)
Addended by: Eugenia Mcalpine on: 10/18/2022 09:07 AM   Modules accepted: Orders

## 2022-10-19 ENCOUNTER — Other Ambulatory Visit: Payer: Self-pay

## 2022-10-19 MED ORDER — RIFAMPIN 300 MG PO CAPS: 600.0000 mg | ORAL_CAPSULE | Freq: Every day | ORAL | 3 refills | Status: DC

## 2022-10-19 MED ORDER — AZITHROMYCIN 500 MG PO TABS: 500.0000 mg | ORAL_TABLET | Freq: Every day | ORAL | 3 refills | Status: DC

## 2022-10-19 MED ORDER — ETHAMBUTOL HCL 400 MG PO TABS: 15.0000 mg/kg | ORAL_TABLET | Freq: Every day | ORAL | 3 refills | Status: DC

## 2022-10-19 MED ORDER — NYSTATIN 100000 UNIT/ML MT SUSP: 5.0000 mL | Freq: Four times a day (QID) | OROMUCOSAL | 0 refills | Status: DC

## 2022-10-19 MED ORDER — NYSTATIN 100000 UNIT/ML MT SUSP: 5.0000 mL | Freq: Four times a day (QID) | OROMUCOSAL | 0 refills | Status: AC

## 2022-11-04 ENCOUNTER — Ambulatory Visit (INDEPENDENT_AMBULATORY_CARE_PROVIDER_SITE_OTHER): Payer: Medicare Other | Admitting: Pulmonary Disease

## 2022-11-04 ENCOUNTER — Encounter: Payer: Self-pay | Admitting: Pulmonary Disease

## 2022-11-04 ENCOUNTER — Other Ambulatory Visit
Admission: RE | Admit: 2022-11-04 | Discharge: 2022-11-04 | Disposition: A | Discharge status: Home / Self Care | Payer: Medicare Other | Attending: Pulmonary Disease | Admitting: Pulmonary Disease

## 2022-11-04 VITALS — BP 138/82 | HR 92 | Temp 98.1°F | Ht 72.0 in | Wt 166.2 lb

## 2022-11-04 DIAGNOSIS — E871 Hypo-osmolality and hyponatremia: Secondary | ICD-10-CM | POA: Diagnosis present

## 2022-11-04 DIAGNOSIS — F17210 Nicotine dependence, cigarettes, uncomplicated: Secondary | ICD-10-CM

## 2022-11-04 DIAGNOSIS — C3491 Malignant neoplasm of unspecified part of right bronchus or lung: Secondary | ICD-10-CM

## 2022-11-04 DIAGNOSIS — J449 Chronic obstructive pulmonary disease, unspecified: Secondary | ICD-10-CM | POA: Diagnosis not present

## 2022-11-04 DIAGNOSIS — A310 Pulmonary mycobacterial infection: Secondary | ICD-10-CM | POA: Diagnosis not present

## 2022-11-04 DIAGNOSIS — R0600 Dyspnea, unspecified: Secondary | ICD-10-CM

## 2022-11-04 LAB — COMPREHENSIVE METABOLIC PANEL
ALT: 10 U/L (ref 0–44)
AST: 18 U/L (ref 15–41)
Albumin: 3.8 g/dL (ref 3.5–5.0)
Alkaline Phosphatase: 90 U/L (ref 38–126)
Anion gap: 9 (ref 5–15)
BUN: 10 mg/dL (ref 8–23)
CO2: 28 mmol/L (ref 22–32)
Calcium: 8.9 mg/dL (ref 8.9–10.3)
Chloride: 95 mmol/L — ABNORMAL LOW (ref 98–111)
Creatinine, Ser: 0.6 mg/dL — ABNORMAL LOW (ref 0.61–1.24)
GFR, Estimated: >60 mL/min (ref >60)
Glucose, Bld: 72 mg/dL (ref 70–99)
Potassium: 4.1 mmol/L (ref 3.5–5.1)
Sodium: 132 mmol/L — ABNORMAL LOW (ref 135–145)
Total Bilirubin: 0.6 mg/dL (ref 0.3–1.2)
Total Protein: 6.9 g/dL (ref 6.5–8.1)

## 2022-11-04 NOTE — Patient Instructions (Signed)
We are checking a chemistry to check your blood sodium.  Please use your oxygen at nighttime we will increase the liter flow to 3 L/min at nighttime.  We are ordering a heart test to evaluate your heart.  Will see you in follow-up in 3 months time with a CT of the chest at that time.

## 2022-11-04 NOTE — Progress Notes (Signed)
Subjective:    Patient ID: Paul Carpenter, male    DOB: 1956-02-10, 67 y.o.   MRN: [id number] Patient Care Team: [name], MD as PCP - General (Internal Medicine) [name], MD as Referring Physician (Vascular Surgery) [name], MD as Consulting Physician (Oncology) [name], MD as Referring Physician (Radiation Oncology) Tyler Pita, MD as Consulting Physician (Pulmonary Disease) Tsosie Billing, MD as Consulting Physician (Infectious Diseases)  Chief Complaint  Patient presents with   Follow-up    Nodule. Constant SOB. Wheezing. Cough with green sputum.    HPI Patient is a very complex 67 year old current smoker (2 PPD, 150 PY) with a history as noted below who presents for follow-up of a cavitary right upper lobe process in the setting of history of non-small cell carcinoma of the lung, with recent recurrence.  The patient's primary pulmonologist is Dr. [name].  Patient was last seen by Dr. [name] on 19 November 2021 at that time the patient had been noted to have recurrence of stage IIIa non-small cell lung cancer of the right middle lobe.  It was suspicion of progression of disease on his most recent PET/CT of 16 November 2021.  Patient at that time could not undergo procedure such as bronchoscopy due to recent C2 cervical fracture.  On the 04 February 2022 he was admitted for observation due to acute on chronic hypoxemic respiratory failure, at that time patient was having issues with fever up to 102.4 and purulent sputum production.  A CT of the chest at that time was performed to assess for PE.  This showed progressive multi cavitary lesion occupying a good portion of the right upper lobe and enlarging right infrahilar mediastinal lymph nodes and several small solid pulmonary nodules.  There was also patchy nodular airspace disease with surrounding tree-in-bud appearance (this has been present since as far back as 2016) there are also severe  underlying emphysematous changes.  Because of these findings MAC was suspected and infectious disease and pulmonary disease consultants were asked to render opinion. The patient was seen by Dr. [name] and by Dr. [name].  The patient requested early discharge home and was sent home on Augmentin and azithromycin for suspected community-acquired pneumonia and possible MAC.  Patient was discharged the following day.  Subsequently the patient saw me on 09 February 2022 as Dr. [name] was unavailable.  At that time he continued to have issues with malaise and right-sided pleuritic chest pain.  Since his discharge microbiology was resulted and the patient is growing Pseudomonas aeruginosa.  The patient was treated for Pseudomonas aeruginosa and improved somewhat.  He then had recurrence of cough with sputum production after that therapy and sputum was recollected this time showing M avium complex.  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   Patient is currently on 3 agents for treatment of his MAC.  He was admitted on 10 November with significant hyponatremia.  These issues have been treated appropriately. He had a CT chest on 03 August 2022 which was consistent with MAI infection and previously treated cancer.  His right upper lobe cavitary lesion has not really changed.  He continues to have chronic weakness and fatigue, chronic cough, and persistent shortness of breath.  Has significant COPD and emphysema. 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  He continues to be followed closely by both oncology and infectious disease.  He denies any hemoptysis. He  has no neurologic complaints.  He denies any fevers,chills or sweats. He denies any nausea, vomiting, constipation, or diarrhea. He has no urinary complaints.  His new complaint today however is that of increasing shortness of breath and paroxysmal nocturnal dyspnea and orthopnea.  This  is new.  He is on oxygen at 2.5 L/min and despite this wakes up with significant dyspnea and gasping for breath.  He unfortunately continues to smoke 2 packs of cigarettes per day.  He has previously been noted to have sleep apnea however he is noncompliant with CPAP due to intolerance.  He does not endorse any other symptomatology.    Review of Systems A 10 point review of systems was performed and it is as noted above otherwise negative.  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   Overweight (BMI 25.0-29.9) 09/22/2021   Hypomagnesemia 09/21/2021   Elevated hemoglobin A1c 06/19/2020   Hypothyroidism, acquired 05/30/2019   Squamous cell lung cancer, right ([place]) 05/30/2019   HFrEF (heart failure with reduced ejection fraction) ([place]) 03/06/2019   Atherosclerosis 12/14/2018   Non-small cell lung cancer, right ([place]) 04/24/2018   Oral ulcer 02/16/2018   Pituitary disorder ([place]) 02/16/2018   Migraine headache 03/07/2017   HCAP (healthcare-associated pneumonia) 09/11/2016   COPD exacerbation ([place]) 09/11/2016   Chronic hyponatremia 09/11/2016   Leukocytosis 09/11/2016   Thrombocytopenia ([place]) 09/11/2016   Cigarette smoker 06/11/2016   Cervical radiculopathy 04/15/2016   Cervical disc disorder at C5-C6 level with radiculopathy 03/09/2016   Impingement syndrome of right shoulder 03/09/2016   Health care maintenance 09/29/2015   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   Benign essential hypertension 05/28/2015   Polycythemia 03/24/2015   Carotid artery disease ([place]) 12/12/2014   Disequilibrium 12/12/2014   Mixed hyperlipidemia 12/10/2014   Incomplete emptying of bladder 06/04/2014   Anxiety 05/18/2014   Chronic coronary artery disease 05/18/2014   Chronic headaches 05/18/2014   Acute shoulder pain 03/15/2014   Impingement  syndrome of left shoulder 03/15/2014   Lung mass 12/06/2013   Kidney stone 11/24/2013   COPD (chronic obstructive pulmonary disease) ([place]) 04/24/2013   Obstructive sleep apnea 04/24/2013   Benign localized prostatic hyperplasia with lower urinary tract symptoms (LUTS) 07/04/2012   Encounter for long-term current use of medication 07/04/2012   ED (erectile dysfunction) of organic origin 07/04/2012   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   albuterol (PROVENTIL) (2.5 MG/3ML) 0.083% nebulizer solution TAKE 3 MLS (2.5 MG TOTAL) BY NEBULIZATION 4 (FOUR) TIMES DAILY AS NEEDED FOR WHEEZING OR SHORTNESS OF BREATH.   albuterol (VENTOLIN HFA) 108 (90 Base) MCG/ACT inhaler INHALE 2 PUFFS INTO THE LUNGS EVERY 4 HOURS AS NEEDED FOR WHEEZING  OR SHORTNESS OF BREATH.   atorvastatin (LIPITOR) 10 MG tablet Take 10 mg by mouth daily.   azithromycin (ZITHROMAX) 500 MG tablet Take 1 tablet (500 mg total) by mouth daily.   Budeson-Glycopyrrol-Formoterol (BREZTRI AEROSPHERE) 160-9-4.8 MCG/ACT AERO Inhale 2 puffs into the lungs in the morning and at bedtime.   diazepam (VALIUM) 5 MG tablet Take 5 mg by mouth at bedtime as needed.   dronabinol (MARINOL) 5 MG capsule Take 1 capsule (5 mg total) by mouth 2 (two) times daily before lunch and supper.   ethambutol (MYAMBUTOL) 400 MG tablet Take 3 tablets (1,200 mg total) by mouth daily.   ferrous sulfate 325 (65 FE) MG  tablet Take 325 mg by mouth at bedtime.   gabapentin (NEURONTIN) 100 MG capsule Take 1 capsule (100 mg total) by mouth at bedtime.   levothyroxine (SYNTHROID) 200 MCG tablet Take 200 mcg by mouth daily before breakfast.   magnesium oxide (MAG-OX) 400 MG tablet Take 600 mg by mouth 2 (two) times daily. Takes 1.5 tablet twice daily   mirtazapine (REMERON) 7.5 MG tablet Take 1 tablet (7.5 mg total) by mouth at bedtime.   nitroGLYCERIN (NITROSTAT) 0.4 MG SL tablet Place under the tongue.   OXYGEN Inhale 2 L into the lungs at bedtime.   pantoprazole (PROTONIX) 40 MG tablet Take 1 tablet (40 mg total) by mouth 2 (two) times daily before meals   rifampin (RIFADIN) 300 MG capsule Take 2 capsules (600 mg total) by mouth daily.   sodium chloride 1 g tablet Take 1 tablet (1 g total) by mouth 3 (three) times daily with meals.   tadalafil (CIALIS) 5 MG tablet Take 1 tablet (5 mg total) by mouth daily as needed for erectile dysfunction.   Immunization History  Administered Date(s) Administered   Fluad Quad(high Dose 65+) 09/22/2020   Influenza Inj Mdck Quad Pf 09/06/2016, 10/02/2019, 09/09/2021, 09/03/2022   Influenza Split 08/05/2013, 07/23/2014, 08/18/2015   Influenza Whole 08/01/2012   Influenza,inj,Quad PF,6+ Mos 08/14/2018   Influenza-Unspecified 09/06/2016, 08/17/2017, 09/09/2021   Zoster Recombinat (Shingrix) 06/19/2020        Objective:   Physical Exam BP 138/82 (BP Location: Left Arm, Cuff Size: Normal)   Pulse 92   Temp 98.1 F (36.7 C)   Ht 6' (1.829 m)   Wt 166 lb 3.2 oz (75.4 kg)   SpO2 98%   BMI 22.54 kg/m   SpO2: 98 % O2 Device: None (Room air)   GENERAL: Debilitated appearing gentleman, ashen color, well nourished.  Mild tachypnea at rest.  Fully ambulatory.  No conversational dyspnea. HEAD: Normocephalic, atraumatic.  EYES: Pupils equal, round, reactive to light.  No scleral icterus.  MOUTH: Oral mucosa moist.  No thrush. NECK: Supple. No thyromegaly. Trachea midline.  No JVD.  No adenopathy. PULMONARY: Good air entry bilaterally.  Loud rhonchi throughout particularly at the right base.  No wheezes. CARDIOVASCULAR: S1 and S2. Regular rate and rhythm.  No rubs, murmurs or gallops heard. ABDOMEN: Benign. MUSCULOSKELETAL: No joint deformity, no clubbing, no edema.  NEUROLOGIC: Mild psychomotor retardation.  No overt focal deficit.  No gait disturbance.  Speech is fluent. SKIN: Intact,warm,dry. PSYCH: Depressed mood and affect.        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 Squamous cell lung cancer, right ([place])  C34.91 CANCELED: CT CHEST WO CONTRAST   Continue observation Patient had been on checkpoint inhibitor Checkpoint inhibitor held due to MAC Follow-up CT as per oncology    3. Stage 2 moderate COPD by GOLD classification ([place])  J44.9    Continue Breztri 2 puffs twice a day Continue as needed albuterol STOP SMOKING!!    4. Paroxysmal nocturnal dyspnea  R06.00 ECHOCARDIOGRAM COMPLETE   Concern for cardiac etiology Evaluate with 2D echo    5. Hyponatremia  E87.1 Comprehensive metabolic panel   CMP today    6. Tobacco dependence due to cigarettes  F17.210    Patient was counseled regards to discontinuation of smoking Patient has no inclination to quit smoking      Orders Placed This Encounter  Procedures   CT CHEST WO CONTRAST    IN 3 MONTHS    Standing Status:   Future    Standing Expiration Date:   11/05/2023    Order Specific Question:   Preferred imaging location?    Answer:   [facility]   Comprehensive metabolic panel    Standing Status:   Future    Standing Expiration Date:   11/05/2023   ECHOCARDIOGRAM COMPLETE    Standing Status:   Future    Standing Expiration Date:   11/05/2023    Order Specific Question:   Where should this test be performed    Answer:    [facility]    Order Specific Question:   Perflutren DEFINITY (image enhancing agent) should be administered unless hypersensitivity or allergy exist    Answer:   Administer Perflutren    Order Specific Question:   Reason for exam-Echo    Answer:   Dyspnea  R06.00   We will evaluate the patient's issues with paroxysmal nocturnal dyspnea.  In the meantime we will increase his liter flow of oxygen at nighttime to 3 L/min.  We are ordering a 2D echo to evaluate cardiac function.  We will see the patient in follow-up in 3 months time he is to contact us prior to that time should any new difficulties arise.  Renold Don, MD Advanced Bronchoscopy PCC[facility]    *This note was dictated using voice recognition software/Dragon.  Despite best efforts to proofread, errors can occur which can change the meaning. Any transcriptional errors that result from this process are unintentional and may not be fully corrected at the time of dictation.

## 2022-11-08 ENCOUNTER — Other Ambulatory Visit: Payer: Self-pay | Admitting: *Deleted

## 2022-11-08 DIAGNOSIS — C3491 Malignant neoplasm of unspecified part of right bronchus or lung: Secondary | ICD-10-CM

## 2022-11-09 ENCOUNTER — Inpatient Hospital Stay: Payer: Medicare Other | Attending: Oncology

## 2022-11-09 ENCOUNTER — Inpatient Hospital Stay (HOSPITAL_BASED_OUTPATIENT_CLINIC_OR_DEPARTMENT_OTHER): Payer: Medicare Other | Admitting: Oncology

## 2022-11-09 ENCOUNTER — Encounter: Payer: Self-pay | Admitting: Oncology

## 2022-11-09 VITALS — BP 103/68 | HR 97 | Temp 97.2°F | Resp 16 | Ht 72.0 in | Wt 165.0 lb

## 2022-11-09 DIAGNOSIS — E039 Hypothyroidism, unspecified: Secondary | ICD-10-CM | POA: Diagnosis not present

## 2022-11-09 DIAGNOSIS — Z7952 Long term (current) use of systemic steroids: Secondary | ICD-10-CM | POA: Diagnosis not present

## 2022-11-09 DIAGNOSIS — F419 Anxiety disorder, unspecified: Secondary | ICD-10-CM | POA: Diagnosis not present

## 2022-11-09 DIAGNOSIS — F17210 Nicotine dependence, cigarettes, uncomplicated: Secondary | ICD-10-CM | POA: Diagnosis not present

## 2022-11-09 DIAGNOSIS — E871 Hypo-osmolality and hyponatremia: Secondary | ICD-10-CM | POA: Diagnosis not present

## 2022-11-09 DIAGNOSIS — R634 Abnormal weight loss: Secondary | ICD-10-CM | POA: Diagnosis not present

## 2022-11-09 DIAGNOSIS — Z95828 Presence of other vascular implants and grafts: Secondary | ICD-10-CM

## 2022-11-09 DIAGNOSIS — R059 Cough, unspecified: Secondary | ICD-10-CM | POA: Insufficient documentation

## 2022-11-09 DIAGNOSIS — Z79899 Other long term (current) drug therapy: Secondary | ICD-10-CM | POA: Diagnosis not present

## 2022-11-09 DIAGNOSIS — Z452 Encounter for adjustment and management of vascular access device: Secondary | ICD-10-CM | POA: Insufficient documentation

## 2022-11-09 DIAGNOSIS — I110 Hypertensive heart disease with heart failure: Secondary | ICD-10-CM | POA: Diagnosis not present

## 2022-11-09 DIAGNOSIS — C3491 Malignant neoplasm of unspecified part of right bronchus or lung: Secondary | ICD-10-CM

## 2022-11-09 DIAGNOSIS — I509 Heart failure, unspecified: Secondary | ICD-10-CM | POA: Diagnosis not present

## 2022-11-09 DIAGNOSIS — Z923 Personal history of irradiation: Secondary | ICD-10-CM | POA: Diagnosis not present

## 2022-11-09 DIAGNOSIS — C342 Malignant neoplasm of middle lobe, bronchus or lung: Secondary | ICD-10-CM | POA: Diagnosis present

## 2022-11-09 LAB — COMPREHENSIVE METABOLIC PANEL
ALT: 9 U/L (ref 0–44)
AST: 19 U/L (ref 15–41)
Albumin: 3.7 g/dL (ref 3.5–5.0)
Alkaline Phosphatase: 82 U/L (ref 38–126)
Anion gap: 8 (ref 5–15)
BUN: 10 mg/dL (ref 8–23)
CO2: 27 mmol/L (ref 22–32)
Calcium: 8.5 mg/dL — ABNORMAL LOW (ref 8.9–10.3)
Chloride: 96 mmol/L — ABNORMAL LOW (ref 98–111)
Creatinine, Ser: 0.66 mg/dL (ref 0.61–1.24)
GFR, Estimated: >60 mL/min (ref >60)
Glucose, Bld: 129 mg/dL — ABNORMAL HIGH (ref 70–99)
Potassium: 4 mmol/L (ref 3.5–5.1)
Sodium: 131 mmol/L — ABNORMAL LOW (ref 135–145)
Total Bilirubin: 0.3 mg/dL (ref 0.3–1.2)
Total Protein: 6.1 g/dL — ABNORMAL LOW (ref 6.5–8.1)

## 2022-11-09 LAB — CBC WITH DIFFERENTIAL/PLATELET
Abs Immature Granulocytes: 0.01 10*3/uL (ref 0.00–0.07)
Basophils Absolute: 0.1 10*3/uL (ref 0.0–0.1)
Basophils Relative: 1 %
Eosinophils Absolute: 0.2 10*3/uL (ref 0.0–0.5)
Eosinophils Relative: 3 %
HCT: 35.4 % — ABNORMAL LOW (ref 39.0–52.0)
Hemoglobin: 12.2 g/dL — ABNORMAL LOW (ref 13.0–17.0)
Immature Granulocytes: 0 %
Lymphocytes Relative: 8 %
Lymphs Abs: 0.5 10*3/uL — ABNORMAL LOW (ref 0.7–4.0)
MCH: 32.2 pg (ref 26.0–34.0)
MCHC: 34.5 g/dL (ref 30.0–36.0)
MCV: 93.4 fL (ref 80.0–100.0)
Monocytes Absolute: 0.4 10*3/uL (ref 0.1–1.0)
Monocytes Relative: 8 %
Neutro Abs: 4.6 10*3/uL (ref 1.7–7.7)
Neutrophils Relative %: 80 %
Platelets: 221 10*3/uL (ref 150–400)
RBC: 3.79 MIL/uL — ABNORMAL LOW (ref 4.22–5.81)
RDW: 13.2 % (ref 11.5–15.5)
WBC: 5.7 10*3/uL (ref 4.0–10.5)
nRBC: 0 % (ref 0.0–0.2)

## 2022-11-09 MED ORDER — HEPARIN SOD (PORK) LOCK FLUSH 100 UNIT/ML IV SOLN
500.0000 [IU] | Freq: Once | INTRAVENOUS | Status: AC
  Administered 2022-11-09: 500 [IU] via INTRAVENOUS
  Filled 2022-11-09: qty 5

## 2022-11-09 MED ORDER — SODIUM CHLORIDE 0.9% FLUSH
10.0000 mL | Freq: Once | INTRAVENOUS | Status: AC
  Administered 2022-11-09: 10 mL via INTRAVENOUS
  Filled 2022-11-09: qty 10

## 2022-11-09 NOTE — Progress Notes (Signed)
[place]  [phone] [phone]  ID: [name] OB: 1955-12-22  MR#: [id number]  WVP#:[id number]  Patient Care Team: [name], MD as PCP - General (Internal Medicine) [name], MD as Referring Physician (Vascular Surgery) [name], MD as Consulting Physician (Oncology) [name], MD as Referring Physician (Radiation Oncology) Tyler Pita, MD as Consulting Physician (Pulmonary Disease) Tsosie Billing, MD as Consulting Physician (Infectious Diseases)   CHIEF COMPLAINT: Recurrent stage IIIa non-small cell lung cancer, right middle lobe.  INTERVAL HISTORY: Patient returns to clinic today for repeat laboratory work and routine 48-monthevaluation.  He continues to have chronic weakness and fatigue.  He has a cough and a cough, but continues to smoke nearly 2 packs/day.  Patient states his appetite has decreased significantly since initiating rifampin and has lost weight in the interim. He denies any hemoptysis.  He has no neurologic complaints.  He denies any fevers. He denies any nausea, vomiting, constipation, or diarrhea. He has no urinary complaints.  Patient feels generally terrible, but offers no further specific complaints.  REVIEW OF SYSTEMS:   Review of Systems  Constitutional:  Positive for malaise/fatigue and weight loss. Negative for fever.  HENT: Negative.  Negative for congestion and sore throat.   Respiratory:  Positive for cough and shortness of breath. Negative for hemoptysis.   Cardiovascular: Negative.  Negative for chest pain and leg swelling.  Gastrointestinal: Negative.  Negative for abdominal pain, blood in stool, melena and nausea.  Genitourinary: Negative.  Negative for dysuria.  Musculoskeletal: Negative.  Negative for joint pain and neck pain.  Skin: Negative.  Negative for rash.  Neurological:  Positive for weakness. Negative for dizziness, sensory change, focal weakness  and headaches.  Psychiatric/Behavioral:  The patient is nervous/anxious.     As per HPI. Otherwise, a complete review of systems is negative.  PAST MEDICAL HISTORY: Past Medical History:  Diagnosis Date   [place]    Anxiety    Asthma    Chest pain    CHF (congestive heart failure) (HCC)    Chicken pox    Complication of anesthesia    o2 dropped after neck fusion   COPD (chronic obstructive pulmonary disease) (HCC)    Coronary artery disease    Cough    chronic  clear phlegm   Dysrhythmia    palpitations   GERD (gastroesophageal reflux disease)    h/o reflux/ hoarsness   Hematochezia    Hemorrhoids    History of chickenpox    History of colon polyps    History of Helicobacter pylori infection    Hoarseness    Hypertension    [place]   Chemo + rad tx's.    Migraines    OSA (obstructive sleep apnea)    has CPAP but does not use   Personal history of tobacco use, presenting hazards to health 03/05/2016   Pneumonia    5/17   Raynaud disease    Raynaud disease    Raynaud's disease    Rotator cuff tear    on right   Shortness of breath dyspnea    Sleep apnea    Ulcer (traumatic) of oral mucosa     PAST SURGICAL HISTORY: Past Surgical History:  Procedure Laterality Date   BACK SURGERY     cervical fusion x 2   CARDIAC CATHETERIZATION     CERVICAL DISCECTOMY     x 2   COLONOSCOPY  COLONOSCOPY N/A 07/25/2015   Procedure: COLONOSCOPY;  Surgeon: Lollie Sails, MD;  Location: [facility] ENDOSCOPY;  Service: Endoscopy;  Laterality: N/A;   COLONOSCOPY WITH PROPOFOL N/A 10/04/2017   Procedure: COLONOSCOPY WITH PROPOFOL;  Surgeon: Lollie Sails, MD;  Location: [facility] ENDOSCOPY;  Service: Endoscopy;  Laterality: N/A;   COLONOSCOPY WITH PROPOFOL N/A 07/10/2020   Procedure: COLONOSCOPY WITH PROPOFOL;  Surgeon: Toledo, Benay Pike, MD;  Location: [facility] ENDOSCOPY;  Service: Gastroenterology;  Laterality: N/A;   ELECTROMAGNETIC NAVIGATION BROCHOSCOPY  Left 06/28/2016   Procedure: ELECTROMAGNETIC NAVIGATION BRONCHOSCOPY;  Surgeon: Vilinda Boehringer, MD;  Location: [facility] ORS;  Service: Cardiopulmonary;  Laterality: Left;   ENDOBRONCHIAL ULTRASOUND N/A 04/11/2018   Procedure: ENDOBRONCHIAL ULTRASOUND;  Surgeon: [name], MD;  Location: [facility] ORS;  Service: Cardiopulmonary;  Laterality: N/A;   ESOPHAGOGASTRODUODENOSCOPY N/A 07/25/2015   Procedure: ESOPHAGOGASTRODUODENOSCOPY (EGD);  Surgeon: Lollie Sails, MD;  Location: [facility] ENDOSCOPY;  Service: Endoscopy;  Laterality: N/A;   ESOPHAGOGASTRODUODENOSCOPY (EGD) WITH PROPOFOL N/A 07/10/2020   Procedure: ESOPHAGOGASTRODUODENOSCOPY (EGD) WITH PROPOFOL;  Surgeon: Toledo, Benay Pike, MD;  Location: [facility] ENDOSCOPY;  Service: Gastroenterology;  Laterality: N/A;   NASAL SINUS SURGERY     x 2    PORTA CATH INSERTION N/A 04/24/2018   Procedure: PORTA CATH INSERTION;  Surgeon: Algernon Huxley, MD;  Location: [place] [facility] LAB;  Service: Cardiovascular;  Laterality: N/A;   rotator cuff surgery Right    07/2016   SEPTOPLASTY     SKIN GRAFT      FAMILY HISTORY Family History  Problem Relation Age of Onset   Heart disease Father    Prostate cancer Father    Heart disease Paternal Grandmother    Heart attack Maternal Grandfather 57   Kidney cancer Neg Hx    Bladder Cancer Neg Hx    Other Neg Hx        pituitary abnormality       ADVANCED DIRECTIVES:    HEALTH MAINTENANCE: Social History   Tobacco Use   Smoking status: Every Day    Packs/day: 3.00    Years: 50.00    Total pack years: 150.00    Types: Cigarettes   Smokeless tobacco: Never   Tobacco comments:    2 PPD 11/04/2022   Vaping Use   Vaping Use: Never used  Substance Use Topics   Alcohol use: Yes    Alcohol/week: 2.0 standard drinks of alcohol    Types: 2 Standard drinks or equivalent per week    Comment: moderate   Drug use: No     Allergies  Allergen Reactions   Lisinopril Rash   Varenicline Rash    Current Outpatient  Medications  Medication Sig Dispense Refill   acetaminophen (TYLENOL) 500 MG tablet Take 500 mg by mouth daily as needed.     albuterol (PROVENTIL) (2.5 MG/3ML) 0.083% nebulizer solution TAKE 3 MLS (2.5 MG TOTAL) BY NEBULIZATION 4 (FOUR) TIMES DAILY AS NEEDED FOR WHEEZING OR SHORTNESS OF BREATH. 90 mL 2   albuterol (VENTOLIN HFA) 108 (90 Base) MCG/ACT inhaler INHALE 2 PUFFS INTO THE LUNGS EVERY 4 HOURS AS NEEDED FOR WHEEZING OR SHORTNESS OF BREATH. 8.5 each 2   atorvastatin (LIPITOR) 10 MG tablet Take 10 mg by mouth daily.     azithromycin (ZITHROMAX) 500 MG tablet Take 1 tablet (500 mg total) by mouth daily. 30 tablet 3   Budeson-Glycopyrrol-Formoterol (BREZTRI AEROSPHERE) 160-9-4.8 MCG/ACT AERO Inhale 2 puffs into the lungs in the morning and at bedtime. 5.9 g 0  diazepam (VALIUM) 5 MG tablet Take 5 mg by mouth at bedtime as needed.     dronabinol (MARINOL) 5 MG capsule Take 1 capsule (5 mg total) by mouth 2 (two) times daily before lunch and supper. 60 capsule 0   ethambutol (MYAMBUTOL) 400 MG tablet Take 3 tablets (1,200 mg total) by mouth daily. 90 tablet 3   ferrous sulfate 325 (65 FE) MG tablet Take 325 mg by mouth at bedtime.     gabapentin (NEURONTIN) 100 MG capsule Take 1 capsule (100 mg total) by mouth at bedtime. 30 capsule 3   levothyroxine (SYNTHROID) 200 MCG tablet Take 200 mcg by mouth daily before breakfast.     magnesium oxide (MAG-OX) 400 MG tablet Take 600 mg by mouth 2 (two) times daily. Takes 1.5 tablet twice daily     nitroGLYCERIN (NITROSTAT) 0.4 MG SL tablet Place under the tongue.     OXYGEN Inhale 2 L into the lungs at bedtime.     pantoprazole (PROTONIX) 40 MG tablet Take 1 tablet (40 mg total) by mouth 2 (two) times daily before meals     rifampin (RIFADIN) 300 MG capsule Take 2 capsules (600 mg total) by mouth daily. 60 capsule 3   sodium chloride 1 g tablet Take 1 tablet (1 g total) by mouth 3 (three) times daily with meals. 90 tablet 1   tadalafil (CIALIS) 5 MG  tablet Take 1 tablet (5 mg total) by mouth daily as needed for erectile dysfunction. 90 tablet 3   mirtazapine (REMERON) 7.5 MG tablet Take 1 tablet (7.5 mg total) by mouth at bedtime. (Patient not taking: Reported on 11/09/2022) 30 tablet 2   predniSONE (DELTASONE) 10 MG tablet 3 tabs daily for 3 days, 2 tab daily for 3 days, 1 tab for 3 day (Patient not taking: Reported on 11/04/2022)     No current facility-administered medications for this visit.    OBJECTIVE: Vitals:   11/09/22 1041  BP: 103/68  Pulse: 97  Resp: 16  Temp: (!) 97.2 F (36.2 C)  SpO2: 100%      Body mass index is 22.38 kg/m.    ECOG FS:2 - Symptomatic, <50% confined to bed  General: Ill-appearing, no acute distress. Eyes: Pink conjunctiva, anicteric sclera. HEENT: Normocephalic, moist mucous membranes. Lungs: No audible wheezing or coughing. Heart: Regular rate and rhythm. Abdomen: Soft, nontender, no obvious distention. Musculoskeletal: No edema, cyanosis, or clubbing. Neuro: Alert, answering all questions appropriately. Cranial nerves grossly intact. Skin: No rashes or petechiae noted. Psych: Flat affect.  LAB RESULTS:  Lab Results  Component Value Date   NA 131 (L) 11/09/2022   K 4.0 11/09/2022   CL 96 (L) 11/09/2022   CO2 27 11/09/2022   GLUCOSE 129 (H) 11/09/2022   BUN 10 11/09/2022   CREATININE 0.66 11/09/2022   CALCIUM 8.5 (L) 11/09/2022   PROT 6.1 (L) 11/09/2022   ALBUMIN 3.7 11/09/2022   AST 19 11/09/2022   ALT 9 11/09/2022   ALKPHOS 82 11/09/2022   BILITOT 0.3 11/09/2022   GFRNONAA >60 11/09/2022   GFRAA >60 04/24/2020    Lab Results  Component Value Date   WBC 5.7 11/09/2022   NEUTROABS 4.6 11/09/2022   HGB 12.2 (L) 11/09/2022   HCT 35.4 (L) 11/09/2022   MCV 93.4 11/09/2022   PLT 221 11/09/2022   Lab Results  Component Value Date   IRON 49 12/28/2021   TIBC 241 (L) 12/28/2021   IRONPCTSAT 20 12/28/2021   Lab Results  Component Value  Date   FERRITIN 318 11/20/2021      STUDIES:  ONCOLOGY HISTORY: Case discussed with pathologist and unable to determine whether this is adenocarcinoma or squamous cell carcinoma.  There is also insufficient tissue to do further testing.  Liquid biopsy did not reveal any actionable mutations.  MRI of the brain completed on Mar 28, 2018 reviewed independently did not reveal metastatic disease.  Patient completed XRT June 26, 2018.  He completed his concurrent single agent carboplatinum on June 21, 2018.  Patient had a reaction to Taxol during cycle 1 and this was discontinued.  He completed a year-long of maintenance durvalumab on June 27, 2019.   PET scan results from November 16, 2021 reviewed independently with once again suspicion of progression of disease.  After lengthy discussion with the patient, it was agreed that no biopsy is necessary and may be too difficult given his recent C2 cervical fracture.  Patient and family expressed interest in systemic chemotherapy.  Patient received his fourth and final cycle of Taxotere on January 28, 2022.    ASSESSMENT: Recurrent stage IIIa non-small cell lung cancer, right middle lobe.  PLAN:    1.  Recurrent stage IIIa non-small cell lung cancer, right middle lobe: See oncology history above.  No treatments are planned at this time while patient receiving extended antibiotic treatment for MAC.  PET scan on April 29, 2022 reviewed independently revealing persistent hypermetabolism that is likely more related to his MAC infection than progressive disease.  Repeat CT scan on August 03, 2022 was essentially stable with persistent abnormalities consistent with his MAC infection.  No further treatments are planned at this time.  Continue close follow-up with infectious disease and pulmonary.  Return to clinic in 3 months with repeat imaging and further evaluation.   2.  Iron deficiency anemia: Resolved.  He last received IV Venofer on December 03, 2021.   3.  Colon polyps:  Patient has a  personal history of greater then 10 adenomatous polyps on his most recent conoloscopy. He does not know of any family history of increased polyps or colon cancer.  Genetic testing to assess for the APC mutation for FAP or AFAP was negative. Continue colonoscopies as per GI. 4. Tobacco Use: Chronic and unchanged.  Patient continues to heavily smoke.  He previously expressed understanding by continuing tobacco use increases his chance of recurrence. 5. Anxiety: Chronic and unchanged.  Continue Valium 10 mg every 12 hours as needed.  Continue treatment and evaluation per primary care.  IV Ativan has also been added to his premedication regimen. 6.  Hyponatremia: Patient's sodium improved to 131.  Continue salt tablets as prescribed by nephrology.  Patient was previously counseled on alcohol consumption and how it can act as a diuretic and reduce sodium levels.  Follow-up with nephrology as scheduled. 7.  Hypothyroidism: Appreciate endocrinology input.  Continue Synthroid as prescribed.  Follow-up with endocrinology as scheduled. 8.  CHF: Patient has an EF of 40%.  He has a repeat cardiac echo within the next week.   9.  Nausea: Patient does not complain of this today.  Continue Zofran as needed. 10.  C2 cervical fracture: Patient had repeat imaging on May 03, 2022 that revealed complete resolution of fracture.   11.  Cough/shortness of breath: Chronic and unchanged.  Continue treatment for MAC as above.  Patient also continues to smoke.  Patient expressed understanding and was in agreement with this plan. He also understands that He can call clinic at any  time with any questions, concerns, or complaints.    [name], MD   11/09/2022 11:55 AM

## 2022-11-10 ENCOUNTER — Ambulatory Visit: Payer: Medicare Other | Attending: Pulmonary Disease

## 2022-11-10 DIAGNOSIS — R0600 Dyspnea, unspecified: Secondary | ICD-10-CM | POA: Insufficient documentation

## 2022-11-10 LAB — ECHOCARDIOGRAM COMPLETE
AR max vel: 3.22 cm2
AV Area VTI: 2.83 cm2
AV Area mean vel: 3.03 cm2
AV Mean grad: 2 mmHg
AV Peak grad: 4.8 mmHg
Ao pk vel: 1.1 m/s
Area-P 1/2: 3.19 cm2
Calc EF: 37.4 %
S' Lateral: 3.2 cm
Single Plane A2C EF: 34.6 %
Single Plane A4C EF: 39.4 %

## 2022-11-16 ENCOUNTER — Other Ambulatory Visit: Payer: Self-pay

## 2022-11-16 DIAGNOSIS — I429 Cardiomyopathy, unspecified: Secondary | ICD-10-CM

## 2022-11-18 LAB — ACID FAST CULTURE WITH REFLEXED SENSITIVITIES (MYCOBACTERIA): Acid Fast Culture: NEGATIVE

## 2022-11-19 ENCOUNTER — Other Ambulatory Visit
Admission: RE | Admit: 2022-11-19 | Discharge: 2022-11-19 | Disposition: A | Discharge status: Home / Self Care | Payer: Medicare Other | Source: Ambulatory Visit | Attending: Nephrology | Admitting: Nephrology

## 2022-11-19 ENCOUNTER — Encounter: Payer: Self-pay | Admitting: Oncology

## 2022-11-19 DIAGNOSIS — E871 Hypo-osmolality and hyponatremia: Secondary | ICD-10-CM | POA: Diagnosis present

## 2022-11-19 LAB — BASIC METABOLIC PANEL
Anion gap: 7 (ref 5–15)
BUN: 10 mg/dL (ref 8–23)
CO2: 27 mmol/L (ref 22–32)
Calcium: 8.8 mg/dL — ABNORMAL LOW (ref 8.9–10.3)
Chloride: 94 mmol/L — ABNORMAL LOW (ref 98–111)
Creatinine, Ser: 0.65 mg/dL (ref 0.61–1.24)
GFR, Estimated: >60 mL/min (ref >60)
Glucose, Bld: 101 mg/dL — ABNORMAL HIGH (ref 70–99)
Potassium: 4.4 mmol/L (ref 3.5–5.1)
Sodium: 128 mmol/L — ABNORMAL LOW (ref 135–145)

## 2022-11-20 LAB — ACID FAST CULTURE WITH REFLEXED SENSITIVITIES (MYCOBACTERIA): Acid Fast Culture: NEGATIVE

## 2022-11-21 ENCOUNTER — Encounter: Payer: Self-pay | Admitting: Pulmonary Disease

## 2022-11-22 LAB — ACID FAST CULTURE WITH REFLEXED SENSITIVITIES (MYCOBACTERIA): Acid Fast Culture: NEGATIVE

## 2022-11-23 ENCOUNTER — Other Ambulatory Visit: Payer: Self-pay | Admitting: *Deleted

## 2022-11-23 MED ORDER — DRONABINOL 5 MG PO CAPS: 5.0000 mg | ORAL_CAPSULE | Freq: Two times a day (BID) | ORAL | 0 refills | Status: DC

## 2022-11-24 ENCOUNTER — Encounter: Payer: Self-pay | Admitting: Oncology

## 2022-12-02 ENCOUNTER — Other Ambulatory Visit: Payer: Self-pay | Admitting: Pulmonary Disease

## 2022-12-08 ENCOUNTER — Encounter: Payer: Self-pay | Admitting: Oncology

## 2022-12-09 ENCOUNTER — Ambulatory Visit: Payer: Medicare Other | Admitting: Infectious Diseases

## 2022-12-14 ENCOUNTER — Ambulatory Visit: Payer: Medicare Other | Attending: Infectious Diseases | Admitting: Infectious Diseases

## 2022-12-14 ENCOUNTER — Other Ambulatory Visit
Admission: RE | Admit: 2022-12-14 | Discharge: 2022-12-14 | Disposition: A | Discharge status: Home / Self Care | Payer: Medicare Other | Source: Ambulatory Visit | Attending: Infectious Diseases | Admitting: Infectious Diseases

## 2022-12-14 DIAGNOSIS — E871 Hypo-osmolality and hyponatremia: Secondary | ICD-10-CM | POA: Insufficient documentation

## 2022-12-14 DIAGNOSIS — A310 Pulmonary mycobacterial infection: Secondary | ICD-10-CM | POA: Diagnosis not present

## 2022-12-14 DIAGNOSIS — Z789 Other specified health status: Secondary | ICD-10-CM | POA: Diagnosis not present

## 2022-12-14 DIAGNOSIS — E785 Hyperlipidemia, unspecified: Secondary | ICD-10-CM | POA: Diagnosis not present

## 2022-12-14 LAB — COMPREHENSIVE METABOLIC PANEL
ALT: 12 U/L (ref 0–44)
AST: 18 U/L (ref 15–41)
Albumin: 3.7 g/dL (ref 3.5–5.0)
Alkaline Phosphatase: 76 U/L (ref 38–126)
Anion gap: 10 (ref 5–15)
BUN: 9 mg/dL (ref 8–23)
CO2: 24 mmol/L (ref 22–32)
Calcium: 8.4 mg/dL — ABNORMAL LOW (ref 8.9–10.3)
Chloride: 87 mmol/L — ABNORMAL LOW (ref 98–111)
Creatinine, Ser: 0.64 mg/dL (ref 0.61–1.24)
GFR, Estimated: >60 mL/min (ref >60)
Glucose, Bld: 86 mg/dL (ref 70–99)
Potassium: 4.3 mmol/L (ref 3.5–5.1)
Sodium: 121 mmol/L — ABNORMAL LOW (ref 135–145)
Total Bilirubin: 0.4 mg/dL (ref 0.3–1.2)
Total Protein: 6.4 g/dL — ABNORMAL LOW (ref 6.5–8.1)

## 2022-12-14 NOTE — Progress Notes (Signed)
NAME: Paul Carpenter  DOB: 1956-06-26  MRN: [id number]  Date/Time: 12/14/2022 8:58 AM  Subjective:  The purpose of this virtual visit is to provide medical care while limiting exposure to the novel coronavirus (COVID19) for both patient and office staff.   Consent was obtained for video visit:  Yes.   Answered questions that patient had about telehealth interaction:  Yes.   I discussed the limitations, risks, security and privacy concerns of performing an evaluation and management service by telephone. I also discussed with the patient that there may be a patient responsible charge related to this service. The patient expressed understanding and agreed to proceed.   Patient Location: Home Provider [place], in [place], his wife, and provider on the visit    Paul Carpenter is a 67 y.o. 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 His wife says it could be low sodium as he has been drinking lot of tea. She wants it to be checked today Also having some aching neck and elbows  Smoking 2 packs/day-   No alcohol since oct 2023  Last Na from 11/26/22 was 130 Saw Paul Carpenter in jan 2024 for ca lung- plan is to continue observation and treat MAc   Following taken from last note  Patient has a complicated lung history As per Paul Carpenter note he was initially diagnosed with non-small cell carcinoma of the lung in 2019. On 04/11/2018 a  bronch with EBUS done and biopsy taken of subcarinal mass- it showed  - POSITIVE FOR MALIGNANT CELLS IN A NECROTIC BACKGROUND.  MRI of the brain completed on Mar 28, 2018 did not reveal any metastatic disease.  He completed radiation June 26, 2018.  He completed concurrent single agent carboplatinum on June 21, 2018.  He had a reaction to Taxol during cycle 1 and this was discontinued.  He completed a year-long maintenance subdural level lab on June 27, 2019.  In July 2022 there was a concern for slight progression of the disease.  PET scan done on 06/03/2021 revealed a spiculated cavitary mass in the apical segment of the right upper lobe with similar SUV which was 3.5 versus 3.31-year ago.  So this was reassuring as per the radiologist.  However disease progression could not be definitely excluded.   no metastasis seen.  A CT scan done on 07/20/2021 showed continued slow evolution of a thick-walled cavitary mass in the right upper lobe which remains concerning for slow-growing neoplasm.  He had a fall and fractured his C 2 in December , 2022.  He was in [facility] at that time.  CTs chest that was done that showed a large right upper lobe soft tissue mass with similar abnormal tissue involving the right pulmonary hilum extending medially into the ipsilateral mediastinum in keeping with a reported history of primary lung cancer. In January he was hospitalized at [facility] for hyponatremia.  At that time oncologist saw him and got another PET scan.  A  bronchoscopy/biopsy could not be done due to C2 fracture.  A PET scan was done on 11/16/2021 and it showed similar size of the cavitary process in the right upper lobe.  Increased size and hypermetabolism compared to the PET on 06/02/2021.  Hypermetabolic pulmonary nodules many of which were new compared to the CAT scan of 10/20/2021.    He was diagnosed to have recurrence and was started on palliative chemo with  taxotere( 4 cycles) on 11/26/21- completed on 01/28/22 He was  hospitalized  02/03/22-02/05/22 for sob and cough and fever and sputum sent which was pseudomonas, but realized after his discharge He saw Paul Carpenter on 02/09/22 and received a 2 week course of cipro for necrotizing pneumonia with pseudomonas. A repeat PET scan done on 02/23/22 showed Thick-walled cavitary mass in the right upper lobe has decreased in overall hypermetabolism, indicative of treatment response. 2. Multifocal areas of nodular consolidation and peribronchovascular nodularity with a waxing and waning appearance, suggesting combination slight improvement in metastatic disease with a superimposed atypical/fungal infectious process. 3. Mildly hypermetabolic low right paratracheal and right hilar lymph nodes, metastatic or reactive. 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 and he was referred to me for treatment AFB from Aug positive for MAC Not been on any further treatment for lung cancer Followed by Paul Carpenter and Paul Carpenter  Past Medical History:  Diagnosis Date   Anginal pain ([place])    Anxiety    Asthma    Chest pain    CHF (congestive heart failure) (HCC)    Chicken pox    Complication of anesthesia    o2 dropped after neck fusion   COPD (chronic obstructive pulmonary disease) ([place])    Coronary artery disease    Cough    chronic  clear phlegm   Dysrhythmia    palpitations   GERD (gastroesophageal reflux disease)    h/o reflux/ hoarsness   Hematochezia    Hemorrhoids    History of chickenpox    History of colon polyps    History of Helicobacter pylori infection    Hoarseness    Hypertension    Lung cancer ([place]) 05/2016   Chemo + rad tx's.    Migraines    OSA (obstructive sleep apnea)    has CPAP but does not use   Personal history of tobacco use, presenting hazards to health 03/05/2016   Pneumonia    5/17   Raynaud disease    Raynaud disease    Raynaud's disease    Rotator cuff tear    on right   Shortness of breath dyspnea    Sleep apnea    Ulcer  (traumatic) of oral mucosa     Past Surgical History:  Procedure Laterality Date   BACK SURGERY     cervical fusion x 2   CARDIAC CATHETERIZATION     CERVICAL DISCECTOMY     x 2   COLONOSCOPY     COLONOSCOPY N/A 07/25/2015   Procedure: COLONOSCOPY;  Surgeon: Lollie Sails, MD;  Location: [facility] ENDOSCOPY;  Service: Endoscopy;  Laterality: N/A;   COLONOSCOPY WITH PROPOFOL N/A 10/04/2017   Procedure: COLONOSCOPY WITH PROPOFOL;  Surgeon: Lollie Sails, MD;  Location: [facility] ENDOSCOPY;  Service: Endoscopy;  Laterality: N/A;   COLONOSCOPY WITH PROPOFOL N/A 07/10/2020   Procedure: COLONOSCOPY WITH PROPOFOL;  Surgeon: Toledo, Benay Pike, MD;  Location: [facility] ENDOSCOPY;  Service: Gastroenterology;  Laterality: N/A;   ELECTROMAGNETIC NAVIGATION BROCHOSCOPY Left 06/28/2016   Procedure: ELECTROMAGNETIC NAVIGATION BRONCHOSCOPY;  Surgeon: Roxanne Mins  Mungal, MD;  Location: [facility] ORS;  Service: Cardiopulmonary;  Laterality: Left;   ENDOBRONCHIAL ULTRASOUND N/A 04/11/2018   Procedure: ENDOBRONCHIAL ULTRASOUND;  Surgeon: [name], MD;  Location: [facility] ORS;  Service: Cardiopulmonary;  Laterality: N/A;   ESOPHAGOGASTRODUODENOSCOPY N/A 07/25/2015   Procedure: ESOPHAGOGASTRODUODENOSCOPY (EGD);  Surgeon: Lollie Sails, MD;  Location: [facility] ENDOSCOPY;  Service: Endoscopy;  Laterality: N/A;   ESOPHAGOGASTRODUODENOSCOPY (EGD) WITH PROPOFOL N/A 07/10/2020   Procedure: ESOPHAGOGASTRODUODENOSCOPY (EGD) WITH PROPOFOL;  Surgeon: Toledo, Benay Pike, MD;  Location: [facility] ENDOSCOPY;  Service: Gastroenterology;  Laterality: N/A;   NASAL SINUS SURGERY     x 2    PORTA CATH INSERTION N/A 04/24/2018   Procedure: PORTA CATH INSERTION;  Surgeon: Algernon Huxley, MD;  Location: [facility];  Service: Cardiovascular;  Laterality: N/A;   rotator cuff surgery Right    07/2016   SEPTOPLASTY     SKIN GRAFT      Social History   Socioeconomic History   Marital status: Married    Spouse name: Not on file   Number of children:  3   Years of education: Not on file   Highest education level: Not on file  Occupational History   Occupation: Architect Work  Tobacco Use   Smoking status: Every Day    Packs/day: 3.00    Years: 50.00    Total pack years: 150.00    Types: Cigarettes   Smokeless tobacco: Never   Tobacco comments:    2 PPD 11/04/2022   Vaping Use   Vaping Use: Never used  Substance and Sexual Activity   Alcohol use: Yes    Alcohol/week: 2.0 standard drinks of alcohol    Types: 2 Standard drinks or equivalent per week    Comment: moderate   Drug use: No   Sexual activity: Not on file  Other Topics Concern   Not on file  Social History Narrative   Not on file   Social Determinants of Health   Financial Resource Strain: Low Risk  (07/22/2022)   Overall Financial Resource Strain (CARDIA)    Difficulty of Paying Living Expenses: Not very hard  Food Insecurity: No Food Insecurity (09/11/2022)   Hunger Vital Sign    Worried About Running Out of Food in the Last Year: Never true    Ran Out of Food in the Last Year: Never true  Transportation Needs: No Transportation Needs (09/11/2022)   PRAPARE - Hydrologist (Medical): No    Lack of Transportation (Non-Medical): No  Physical Activity: Inactive (07/22/2022)   Exercise Vital Sign    Days of Exercise per Week: 0 days    Minutes of Exercise per Session: 0 min  Stress: Stress Concern Present (07/22/2022)   [place]    Feeling of Stress : Rather much  Social Connections: Moderately Integrated (07/22/2022)   Social Connection and Isolation Panel [NHANES]    Frequency of Communication with Friends and Family: Twice a week    Frequency of Social Gatherings with Friends and Family: Twice a week    Attends Religious Services: 1 to 4 times per year    Active Member of [employer] or Organizations: No    Attends Archivist Meetings: Not on file     Marital Status: Married  Intimate Partner Violence: Not At Risk (09/11/2022)   Humiliation, Afraid, Rape, and Kick questionnaire    Fear of Current or Ex-Partner: No    Emotionally  Abused: No    Physically Abused: No    Sexually Abused: No    Family History  Problem Relation Age of Onset   Heart disease Father    Prostate cancer Father    Heart disease Paternal Grandmother    Heart attack Maternal Grandfather 59   Kidney cancer Neg Hx    Bladder Cancer Neg Hx    Other Neg Hx        pituitary abnormality   Allergies  Allergen Reactions   Lisinopril Rash   Varenicline Rash   I? Current Outpatient Medications  Medication Sig Dispense Refill   acetaminophen (TYLENOL) 500 MG tablet Take 500 mg by mouth daily as needed.     albuterol (PROVENTIL) (2.5 MG/3ML) 0.083% nebulizer solution TAKE 3 MLS (2.5 MG TOTAL) BY NEBULIZATION 4 (FOUR) TIMES DAILY AS NEEDED FOR WHEEZING OR SHORTNESS OF BREATH. 90 mL 2   albuterol (VENTOLIN HFA) 108 (90 Base) MCG/ACT inhaler INHALE 2 PUFFS BY MOUTH EVERY 4 HOURS AS NEEDED FOR WHEEZE OR FOR SHORTNESS OF BREATH 8.5 each 2   atorvastatin (LIPITOR) 10 MG tablet Take 10 mg by mouth daily.     azithromycin (ZITHROMAX) 500 MG tablet Take 1 tablet (500 mg total) by mouth daily. 30 tablet 3   Budeson-Glycopyrrol-Formoterol (BREZTRI AEROSPHERE) 160-9-4.8 MCG/ACT AERO Inhale 2 puffs into the lungs in the morning and at bedtime. 5.9 g 0   diazepam (VALIUM) 5 MG tablet Take 5 mg by mouth at bedtime as needed.     dronabinol (MARINOL) 5 MG capsule Take 1 capsule (5 mg total) by mouth 2 (two) times daily before lunch and supper. 60 capsule 0   ethambutol (MYAMBUTOL) 400 MG tablet Take 3 tablets (1,200 mg total) by mouth daily. 90 tablet 3   ferrous sulfate 325 (65 FE) MG tablet Take 325 mg by mouth at bedtime.     gabapentin (NEURONTIN) 100 MG capsule Take 1 capsule (100 mg total) by mouth at bedtime. 30 capsule 3   levothyroxine (SYNTHROID) 200 MCG tablet Take 200 mcg  by mouth daily before breakfast.     magnesium oxide (MAG-OX) 400 MG tablet Take 600 mg by mouth 2 (two) times daily. Takes 1.5 tablet twice daily     mirtazapine (REMERON) 7.5 MG tablet Take 1 tablet (7.5 mg total) by mouth at bedtime. (Patient not taking: Reported on 11/09/2022) 30 tablet 2   nitroGLYCERIN (NITROSTAT) 0.4 MG SL tablet Place under the tongue.     OXYGEN Inhale 2 L into the lungs at bedtime.     pantoprazole (PROTONIX) 40 MG tablet Take 1 tablet (40 mg total) by mouth 2 (two) times daily before meals     predniSONE (DELTASONE) 10 MG tablet 3 tabs daily for 3 days, 2 tab daily for 3 days, 1 tab for 3 day (Patient not taking: Reported on 11/04/2022)     rifampin (RIFADIN) 300 MG capsule Take 2 capsules (600 mg total) by mouth daily. 60 capsule 3   sodium chloride 1 g tablet Take 1 tablet (1 g total) by mouth 3 (three) times daily with meals. 90 tablet 1   tadalafil (CIALIS) 5 MG tablet Take 1 tablet (5 mg total) by mouth daily as needed for erectile dysfunction. 90 tablet 3   No current facility-administered medications for this visit.       REVIEW OF SYSTEMS:  Const: no fever or chills, some weakness , lack of energy Eyes: negative diplopia or visual changes, negative eye pain ENT: negative coryza,  negative sore throat Resp: cough, , dyspnea  better rt sided pleuritic chest pain better than before Intermittent light green sputum Cards: negative for chest pain, palpitations, lower extremity edema GU: negative for frequency, dysuria and hematuria GI: poor appetite , weight loss Skin: negative for rash and pruritus Heme: negative for easy bruising and gum/nose bleeding MS: fatigue Neurolo:negative for headaches, dizziness, vertigo, memory problems  Psych:depression  Endocrine: hypothyroidism Allergy/Immunology-as above: Continues to smoke2 pack/day-    VITALS:  There were no vitals taken for this visit.  Wife took his vitals at home last nightand it was 119/59, HR 96,  sats 96% Temp 97.6   PHYSICAL EXAM: thru video visit General: Alert, cooperative, no  distress at rest, tired Tongue no obvious candida Lungs: /CVS/Abd not examined Neurologic: Grossly non-focal  Labs    Latest Ref Rng & Units 11/09/2022   10:27 AM 09/11/2022    4:22 AM 09/10/2022    5:59 PM  CBC  WBC 4.0 - 10.5 K/uL 5.7  5.4  5.8   Hemoglobin 13.0 - 17.0 g/dL 12.2  12.1  12.2   Hematocrit 39.0 - 52.0 % 35.4  32.6  33.8   Platelets 150 - 400 K/uL 221  188  193        Latest Ref Rng & Units 11/19/2022    2:22 PM 11/09/2022   10:27 AM 11/04/2022    2:50 PM  CMP  Glucose 70 - 99 mg/dL 101  129  72   BUN 8 - 23 mg/dL 10  10  10    Creatinine 0.61 - 1.24 mg/dL 0.65  0.66  0.60   Sodium 135 - 145 mmol/L 128  131  132   Potassium 3.5 - 5.1 mmol/L 4.4  4.0  4.1   Chloride 98 - 111 mmol/L 94  96  95   CO2 22 - 32 mmol/L 27  27  28    Calcium 8.9 - 10.3 mg/dL 8.8  8.5  8.9   Total Protein 6.5 - 8.1 g/dL  6.1  6.9   Total Bilirubin 0.3 - 1.2 mg/dL  0.3  0.6   Alkaline Phos 38 - 126 U/L  82  90   AST 15 - 41 U/L  19  18   ALT 0 - 44 U/L  9  10     s  ? Impression/Recommendation Cavitary lung lesion rt side- Due to Mycobacterium avium intracellulare with underling lung cancer and radiation .  In 2019 had undergone chemo, radiation and immunotherapy Since January 2023 he had taken 4 cycles of palliative chemotherapy. No chemo since the past 10 months Being treated for MAC - on azithromycin, ethambutol  since June 2023 and rifampin since sept 2023 10/07/11/7 and 10/08/22 sputum AFB culture negative Continue same antibiotics- will need for 12 more moths- will repeat Sputum AFB X 3 in May 2024  Weakness- coukd be worsening hyponatremia- as requested by wife and patient CMP will be done today- He will contact PaulSingh if result abnormal Hyponatremia due to SIADH-  On sodium tablets+ magnesium  Hyperlipidemia on atorvastatin. With rifampin the level can decrease or increase-will check  CK  COPD  Hypothyroidism on Synthroid- recently increased     Poor appetite- on marinol with not much help- also expensive prescription   Discussed with patient and his wife in detail  P.S- Na 121 Informed patient and told him to contact PaulSingh also informed nephrologist PaulSingh- His office will reach out to him  Follow up May 2024 Total  time spent on the video call and care coordination was 30 minutes

## 2022-12-14 NOTE — Progress Notes (Deleted)
12/15/2022  11:21 AM   [name] 30-Nov-1955 [id number]  Referring provider: [name], [facility] [place],  [place] [address]  No chief complaint on file.  Urological history: 1. ED -contributing factors of age, BPH, HTN, CAD, hypothyroidism, smoking, COPD,  BP pills,  depression and anxiety -SHIM ***  2. BPH with LU TS -cysto 08/2020 - moderate bilateral lobe enlargement  -I PSS *** -PVR ***  3. Nephrolithiasis -5 mm left renal stone on 06/02/2021  HPI: TIRTH COTHRON is a 67 y.o.  male who presents today for BPH.         Score: 1-7 Severe ED 8-11 Moderate ED 12-16 Mild-Moderate ED 17-21 Mild ED 22-25 No ED         Score:  1-7 Mild 8-19 Moderate 20-35 Severe  PMH: Past Medical History:  Diagnosis Date   Anginal pain (HCC)    Anxiety    Asthma    Chest pain    CHF (congestive heart failure) (HCC)    Chicken pox    Complication of anesthesia    o2 dropped after neck fusion   COPD (chronic obstructive pulmonary disease) (HCC)    Coronary artery disease    Cough    chronic  clear phlegm   Dysrhythmia    palpitations   GERD (gastroesophageal reflux disease)    h/o reflux/ hoarsness   Hematochezia    Hemorrhoids    History of chickenpox    History of colon polyps    History of Helicobacter pylori infection    Hoarseness    Hypertension    Lung cancer ([place]) 05/2016   Chemo + rad tx's.    Migraines    OSA (obstructive sleep apnea)    has CPAP but does not use   Personal history of tobacco use, presenting hazards to health 03/05/2016   Pneumonia    5/17   Raynaud disease    Raynaud disease    Raynaud's disease    Rotator cuff tear    on right   Shortness of breath dyspnea    Sleep apnea    Ulcer (traumatic) of oral mucosa     Surgical History: Past Surgical History:  Procedure Laterality Date   BACK SURGERY     cervical fusion x 2   CARDIAC CATHETERIZATION     CERVICAL  DISCECTOMY     x 2   COLONOSCOPY     COLONOSCOPY N/A 07/25/2015   Procedure: COLONOSCOPY;  Surgeon: Lollie Sails, MD;  Location: [facility] ENDOSCOPY;  Service: Endoscopy;  Laterality: N/A;   COLONOSCOPY WITH PROPOFOL N/A 10/04/2017   Procedure: COLONOSCOPY WITH PROPOFOL;  Surgeon: Lollie Sails, MD;  Location: [facility] ENDOSCOPY;  Service: Endoscopy;  Laterality: N/A;   COLONOSCOPY WITH PROPOFOL N/A 07/10/2020   Procedure: COLONOSCOPY WITH PROPOFOL;  Surgeon: [place], Benay Pike, MD;  Location: [facility] ENDOSCOPY;  Service: Gastroenterology;  Laterality: N/A;   ELECTROMAGNETIC NAVIGATION BROCHOSCOPY Left 06/28/2016   Procedure: ELECTROMAGNETIC NAVIGATION BRONCHOSCOPY;  Surgeon: Vilinda Boehringer, MD;  Location: [facility] ORS;  Service: Cardiopulmonary;  Laterality: Left;   ENDOBRONCHIAL ULTRASOUND N/A 04/11/2018   Procedure: ENDOBRONCHIAL ULTRASOUND;  Surgeon: [name], MD;  Location: [facility] ORS;  Service: Cardiopulmonary;  Laterality: N/A;   ESOPHAGOGASTRODUODENOSCOPY N/A 07/25/2015   Procedure: ESOPHAGOGASTRODUODENOSCOPY (EGD);  Surgeon: Lollie Sails, MD;  Location: [facility] ENDOSCOPY;  Service: Endoscopy;  Laterality: N/A;   ESOPHAGOGASTRODUODENOSCOPY (EGD) WITH PROPOFOL N/A 07/10/2020   Procedure: ESOPHAGOGASTRODUODENOSCOPY (EGD) WITH PROPOFOL;  Surgeon: [place],  Benay Pike, MD;  Location: [facility] ENDOSCOPY;  Service: Gastroenterology;  Laterality: N/A;   NASAL SINUS SURGERY     x 2    PORTA CATH INSERTION N/A 04/24/2018   Procedure: PORTA CATH INSERTION;  Surgeon: Algernon Huxley, MD;  Location: [facility] CV LAB;  Service: Cardiovascular;  Laterality: N/A;   rotator cuff surgery Right    07/2016   SEPTOPLASTY     SKIN GRAFT      Home Medications:  Allergies as of 12/15/2022       Reactions   Lisinopril Rash   Varenicline Rash        Medication List        Accurate as of December 14, 2022 11:21 AM. If you have any questions, ask your nurse or doctor.          acetaminophen 500 MG  tablet Commonly known as: TYLENOL Take 500 mg by mouth daily as needed.   albuterol (2.5 MG/3ML) 0.083% nebulizer solution Commonly known as: PROVENTIL TAKE 3 MLS (2.5 MG TOTAL) BY NEBULIZATION 4 (FOUR) TIMES DAILY AS NEEDED FOR WHEEZING OR SHORTNESS OF BREATH.   albuterol 108 (90 Base) MCG/ACT inhaler Commonly known as: VENTOLIN HFA INHALE 2 PUFFS BY MOUTH EVERY 4 HOURS AS NEEDED FOR WHEEZE OR FOR SHORTNESS OF BREATH   atorvastatin 10 MG tablet Commonly known as: LIPITOR Take 10 mg by mouth daily.   azithromycin 500 MG tablet Commonly known as: ZITHROMAX Take 1 tablet (500 mg total) by mouth daily.   Breztri Aerosphere 160-9-4.8 MCG/ACT Aero Generic drug: Budeson-Glycopyrrol-Formoterol Inhale 2 puffs into the lungs in the morning and at bedtime.   diazepam 5 MG tablet Commonly known as: VALIUM Take 5 mg by mouth at bedtime as needed.   dronabinol 5 MG capsule Commonly known as: MARINOL Take 1 capsule (5 mg total) by mouth 2 (two) times daily before lunch and supper.   ethambutol 400 MG tablet Commonly known as: MYAMBUTOL Take 3 tablets (1,200 mg total) by mouth daily.   ferrous sulfate 325 (65 FE) MG tablet Take 325 mg by mouth at bedtime.   gabapentin 100 MG capsule Commonly known as: Neurontin Take 1 capsule (100 mg total) by mouth at bedtime.   levothyroxine 200 MCG tablet Commonly known as: SYNTHROID Take 200 mcg by mouth daily before breakfast.   magnesium oxide 400 MG tablet Commonly known as: MAG-OX Take 600 mg by mouth 2 (two) times daily. Takes 1.5 tablet twice daily   mirtazapine 7.5 MG tablet Commonly known as: REMERON Take 1 tablet (7.5 mg total) by mouth at bedtime.   nitroGLYCERIN 0.4 MG SL tablet Commonly known as: NITROSTAT Place under the tongue.   OXYGEN Inhale 2 L into the lungs at bedtime.   pantoprazole 40 MG tablet Commonly known as: PROTONIX Take 1 tablet (40 mg total) by mouth 2 (two) times daily before meals   predniSONE 10  MG tablet Commonly known as: DELTASONE 3 tabs daily for 3 days, 2 tab daily for 3 days, 1 tab for 3 day   rifampin 300 MG capsule Commonly known as: RIFADIN Take 2 capsules (600 mg total) by mouth daily.   sodium chloride 1 g tablet Take 1 tablet (1 g total) by mouth 3 (three) times daily with meals.   tadalafil 5 MG tablet Commonly known as: CIALIS Take 1 tablet (5 mg total) by mouth daily as needed for erectile dysfunction.        Allergies:  Allergies  Allergen Reactions  Lisinopril Rash   Varenicline Rash    Family History: Family History  Problem Relation Age of Onset   Heart disease Father    Prostate cancer Father    Heart disease Paternal Grandmother    Heart attack Maternal Grandfather 40   Kidney cancer Neg Hx    Bladder Cancer Neg Hx    Other Neg Hx        pituitary abnormality    Social History:  reports that he has been smoking cigarettes. He has a 150.00 pack-year smoking history. He has never used smokeless tobacco. He reports current alcohol use of about 2.0 standard drinks of alcohol per week. He reports that he does not use drugs.  ROS: For pertinent review of systems please refer to history of present illness  Physical Exam: There were no vitals taken for this visit.  Constitutional:  Well nourished. Alert and oriented, No acute distress. HEENT: [place] AT, moist mucus membranes.  Trachea midline Cardiovascular: No clubbing, cyanosis, or edema. Respiratory: Normal respiratory effort, no increased work of breathing. GU: No CVA tenderness.  No bladder fullness or masses.  Patient with circumcised/uncircumcised phallus. ***Foreskin easily retracted***  Urethral meatus is patent.  No penile discharge. No penile lesions or rashes. Scrotum without lesions, cysts, rashes and/or edema.  Testicles are located scrotally bilaterally. No masses are appreciated in the testicles. Left and right epididymis are normal. Rectal: Patient with  normal sphincter tone.  Anus and perineum without scarring or rashes. No rectal masses are appreciated. Prostate is approximately *** grams, *** nodules are appreciated. Seminal vesicles are normal. Neurologic: Grossly intact, no focal deficits, moving all 4 extremities. Psychiatric: Normal mood and affect.   Laboratory Data:    Latest Ref Rng & Units 12/14/2022   10:44 AM 11/19/2022    2:22 PM 11/09/2022   10:27 AM  CMP  Glucose 70 - 99 mg/dL 86  101  129   BUN 8 - 23 mg/dL 9  10  10    Creatinine 0.61 - 1.24 mg/dL 0.64  0.65  0.66   Sodium 135 - 145 mmol/L 121  128  131   Potassium 3.5 - 5.1 mmol/L 4.3  4.4  4.0   Chloride 98 - 111 mmol/L 87  94  96   CO2 22 - 32 mmol/L 24  27  27    Calcium 8.9 - 10.3 mg/dL 8.4  8.8  8.5   Total Protein 6.5 - 8.1 g/dL 6.4   6.1   Total Bilirubin 0.3 - 1.2 mg/dL 0.4   0.3   Alkaline Phos 38 - 126 U/L 76   82   AST 15 - 41 U/L 18   19   ALT 0 - 44 U/L 12   9     Urinalysis *** I have reviewed the labs.   Pertinent Imaging N/A  Assessment & Plan:    1. BPH with LUTS -PSA stable *** -DRE benign *** -UA benign *** -PVR < 300 cc *** -symptoms - *** -most bothersome symptoms are *** -continue conservative management, avoiding bladder irritants and timed voiding's -Initiate alpha-blocker (***), discussed side effects *** -Initiate 5 alpha reductase inhibitor (***), discussed side effects *** -Continue tamsulosin 0.4 mg daily, alfuzosin 10 mg daily, Rapaflo 8 mg daily, terazosin, doxazosin, Cialis 5 mg daily and finasteride 5 mg daily, dutasteride 0.5 mg daily***:refills given -Cannot tolerate medication or medication failure, schedule cystoscopy ***     No follow-ups on file.  Royden Purl  [place]  [address] [place], [place] [address] [phone]

## 2022-12-15 ENCOUNTER — Ambulatory Visit: Payer: BC Managed Care – PPO | Admitting: Urology

## 2022-12-15 DIAGNOSIS — N138 Other obstructive and reflux uropathy: Secondary | ICD-10-CM

## 2022-12-21 ENCOUNTER — Inpatient Hospital Stay: Payer: Medicare Other | Attending: Oncology

## 2022-12-21 DIAGNOSIS — Z452 Encounter for adjustment and management of vascular access device: Secondary | ICD-10-CM | POA: Insufficient documentation

## 2022-12-21 DIAGNOSIS — Z95828 Presence of other vascular implants and grafts: Secondary | ICD-10-CM

## 2022-12-21 DIAGNOSIS — C342 Malignant neoplasm of middle lobe, bronchus or lung: Secondary | ICD-10-CM | POA: Insufficient documentation

## 2022-12-21 MED ORDER — HEPARIN SOD (PORK) LOCK FLUSH 100 UNIT/ML IV SOLN
500.0000 [IU] | Freq: Once | INTRAVENOUS | Status: AC
  Administered 2022-12-21: 500 [IU] via INTRAVENOUS
  Filled 2022-12-21: qty 5

## 2022-12-21 MED ORDER — SODIUM CHLORIDE 0.9% FLUSH
10.0000 mL | Freq: Once | INTRAVENOUS | Status: AC
  Administered 2022-12-21: 10 mL via INTRAVENOUS
  Filled 2022-12-21: qty 10

## 2022-12-24 ENCOUNTER — Encounter: Payer: Self-pay | Admitting: Pulmonary Disease

## 2022-12-29 NOTE — Progress Notes (Unsigned)
12/30/2022  2:43 PM   Paul Carpenter [birth year] [id number]  Referring provider: [name], MD [address] [facility] [place],   [address]  Chief Complaint  Patient presents with   Benign Prostatic Hypertrophy   Follow-up   Urological history: 1. ED -contributing factors of age, BPH, HTN, CAD, hypothyroidism, smoking, COPD,  BP pills,  depression and anxiety -SHIM 16  2. BPH with LU TS -cysto 08/2020 - moderate bilateral lobe enlargement  -I PSS 23/4 -PVR 0 mL  3. Nephrolithiasis -5 mm left renal stone on 06/02/2021  HPI: Paul Carpenter is a 67 y.o.  male who presents today for BPH and ED.  He would like a PSA and a testosterone today.  SHIM 16  He has noticed some pain with erections over the last 5 to 6 months and also a new Peyronie's plaque developing at the base of his penis.  He was taking tadalafil 5 mg daily, but he has been without the medication for a month.   SHIM     Row Name 12/30/22 1400         SHIM: Over the last 6 months:   How do you rate your confidence that you could get and keep an erection? Low     When you had erections with sexual stimulation, how often were your erections hard enough for penetration (entering your partner)? Sometimes (about half the time)     During sexual intercourse, how often were you able to maintain your erection after you had penetrated (entered) your partner? Most Times (much more than half the time)     During sexual intercourse, how difficult was it to maintain your erection to completion of intercourse? Slightly Difficult     When you attempted sexual intercourse, how often was it satisfactory for you? Sometimes (about half the time)       SHIM Total Score   SHIM 16                 Score: 1-7 Severe ED 8-11 Moderate ED 12-16 Mild-Moderate ED 17-21 Mild ED 22-25 No ED  I PSS 23/4  PVR 0 mL   He does not recall whether or not his urinary symptoms were worse  after he had been without the Cialis for 1 month.  He states that now have the urge to urinate he will go to try to urinate stand there and nothing will come out and then he will leave the toilet and then return later to void.    Patient denies any modifying or aggravating factors.  Patient denies any gross hematuria, dysuria or suprapubic/flank pain.  Patient denies any fevers, chills, nausea or vomiting.     IPSS     Row Name 12/30/22 1400         International Prostate Symptom Score   How often have you had the sensation of not emptying your bladder? More than half the time     How often have you had to urinate less than every two hours? More than half the time     How often have you found you stopped and started again several times when you urinated? Less than half the time     How often have you found it difficult to postpone urination? Less than half the time     How often have you had a weak urinary stream? More than half the time     How often have you had to strain  to start urination? About half the time     How many times did you typically get up at night to urinate? 4 Times     Total IPSS Score 23       Quality of Life due to urinary symptoms   If you were to spend the rest of your life with your urinary condition just the way it is now how would you feel about that? Mostly Disatisfied                 Score:  1-7 Mild 8-19 Moderate 20-35 Severe  PMH: Past Medical History:  Diagnosis Date   Anginal pain (HCC)    Anxiety    Asthma    Chest pain    CHF (congestive heart failure) (HCC)    Chicken pox    Complication of anesthesia    o2 dropped after neck fusion   COPD (chronic obstructive pulmonary disease) (HCC)    Coronary artery disease    Cough    chronic  clear phlegm   Dysrhythmia    palpitations   GERD (gastroesophageal reflux disease)    h/o reflux/ hoarsness   Hematochezia    Hemorrhoids    History of chickenpox    History of colon polyps     History of Helicobacter pylori infection    Hoarseness    Hypertension    Lung cancer ([place]) 05/2016   Chemo + rad tx's.    Migraines    OSA (obstructive sleep apnea)    has CPAP but does not use   Personal history of tobacco use, presenting hazards to health 03/05/2016   Pneumonia    5/17   Raynaud disease    Raynaud disease    Raynaud's disease    Rotator cuff tear    on right   Shortness of breath dyspnea    Sleep apnea    Ulcer (traumatic) of oral mucosa     Surgical History: Past Surgical History:  Procedure Laterality Date   BACK SURGERY     cervical fusion x 2   CARDIAC CATHETERIZATION     CERVICAL DISCECTOMY     x 2   COLONOSCOPY     COLONOSCOPY N/A 07/25/2015   Procedure: COLONOSCOPY;  Surgeon: Lollie Sails, MD;  Location: [facility] ENDOSCOPY;  Service: Endoscopy;  Laterality: N/A;   COLONOSCOPY WITH PROPOFOL N/A 10/04/2017   Procedure: COLONOSCOPY WITH PROPOFOL;  Surgeon: Lollie Sails, MD;  Location: [facility] ENDOSCOPY;  Service: Endoscopy;  Laterality: N/A;   COLONOSCOPY WITH PROPOFOL N/A 07/10/2020   Procedure: COLONOSCOPY WITH PROPOFOL;  Surgeon: [place], Benay Pike, MD;  Location: [facility] ENDOSCOPY;  Service: Gastroenterology;  Laterality: N/A;   ELECTROMAGNETIC NAVIGATION BROCHOSCOPY Left 06/28/2016   Procedure: ELECTROMAGNETIC NAVIGATION BRONCHOSCOPY;  Surgeon: Vilinda Boehringer, MD;  Location: [facility] ORS;  Service: Cardiopulmonary;  Laterality: Left;   ENDOBRONCHIAL ULTRASOUND N/A 04/11/2018   Procedure: ENDOBRONCHIAL ULTRASOUND;  Surgeon: [name], MD;  Location: [facility] ORS;  Service: Cardiopulmonary;  Laterality: N/A;   ESOPHAGOGASTRODUODENOSCOPY N/A 07/25/2015   Procedure: ESOPHAGOGASTRODUODENOSCOPY (EGD);  Surgeon: Lollie Sails, MD;  Location: [facility] ENDOSCOPY;  Service: Endoscopy;  Laterality: N/A;   ESOPHAGOGASTRODUODENOSCOPY (EGD) WITH PROPOFOL N/A 07/10/2020   Procedure: ESOPHAGOGASTRODUODENOSCOPY (EGD) WITH PROPOFOL;  Surgeon: [place], Benay Pike, MD;  Location:  [facility] ENDOSCOPY;  Service: Gastroenterology;  Laterality: N/A;   NASAL SINUS SURGERY     x 2    PORTA CATH INSERTION N/A 04/24/2018   Procedure: PORTA CATH INSERTION;  Surgeon: Leotis Pain  S, MD;  Location: [place] CV LAB;  Service: Cardiovascular;  Laterality: N/A;   rotator cuff surgery Right    07/2016   SEPTOPLASTY     SKIN GRAFT      Home Medications:  Allergies as of 12/30/2022       Reactions   Lisinopril Rash   Varenicline Rash        Medication List        Accurate as of December 30, 2022  2:43 PM. If you have any questions, ask your nurse or doctor.          STOP taking these medications    azithromycin 500 MG tablet Commonly known as: ZITHROMAX Stopped by: Zara Council, PA-C       TAKE these medications    acetaminophen 500 MG tablet Commonly known as: TYLENOL Take 500 mg by mouth daily as needed.   albuterol (2.5 MG/3ML) 0.083% nebulizer solution Commonly known as: PROVENTIL TAKE 3 MLS (2.5 MG TOTAL) BY NEBULIZATION 4 (FOUR) TIMES DAILY AS NEEDED FOR WHEEZING OR SHORTNESS OF BREATH.   albuterol 108 (90 Base) MCG/ACT inhaler Commonly known as: VENTOLIN HFA INHALE 2 PUFFS BY MOUTH EVERY 4 HOURS AS NEEDED FOR WHEEZE OR FOR SHORTNESS OF BREATH   atorvastatin 10 MG tablet Commonly known as: LIPITOR Take 10 mg by mouth daily.   Breztri Aerosphere 160-9-4.8 MCG/ACT Aero Generic drug: Budeson-Glycopyrrol-Formoterol Inhale 2 puffs into the lungs in the morning and at bedtime.   diazepam 5 MG tablet Commonly known as: VALIUM Take 5 mg by mouth at bedtime as needed.   dronabinol 5 MG capsule Commonly known as: MARINOL Take 1 capsule (5 mg total) by mouth 2 (two) times daily before lunch and supper.   ethambutol 400 MG tablet Commonly known as: MYAMBUTOL Take 3 tablets (1,200 mg total) by mouth daily.   ferrous sulfate 325 (65 FE) MG tablet Take 325 mg by mouth at bedtime.   gabapentin 100 MG capsule Commonly known as: Neurontin Take  1 capsule (100 mg total) by mouth at bedtime.   levothyroxine 200 MCG tablet Commonly known as: SYNTHROID Take 200 mcg by mouth daily before breakfast.   magnesium oxide 400 MG tablet Commonly known as: MAG-OX Take 600 mg by mouth 2 (two) times daily. Takes 1.5 tablet twice daily   mirtazapine 7.5 MG tablet Commonly known as: REMERON Take 1 tablet (7.5 mg total) by mouth at bedtime.   nitroGLYCERIN 0.4 MG SL tablet Commonly known as: NITROSTAT Place under the tongue.   OXYGEN Inhale 2 L into the lungs at bedtime.   pantoprazole 40 MG tablet Commonly known as: PROTONIX Take 1 tablet (40 mg total) by mouth 2 (two) times daily before meals   predniSONE 10 MG tablet Commonly known as: DELTASONE   rifampin 300 MG capsule Commonly known as: RIFADIN Take 2 capsules (600 mg total) by mouth daily.   sodium chloride 1 g tablet Take 1 tablet (1 g total) by mouth 3 (three) times daily with meals.   tadalafil 5 MG tablet Commonly known as: CIALIS Take 1 tablet (5 mg total) by mouth daily as needed for erectile dysfunction.        Allergies:  Allergies  Allergen Reactions   Lisinopril Rash   Varenicline Rash    Family History: Family History  Problem Relation Age of Onset   Heart disease Father    Prostate cancer Father    Heart disease Paternal Grandmother    Heart attack Maternal Grandfather 44  Kidney cancer Neg Hx    Bladder Cancer Neg Hx    Other Neg Hx        pituitary abnormality    Social History:  reports that he has been smoking cigarettes. He has a 150.00 pack-year smoking history. He has been exposed to tobacco smoke. He has never used smokeless tobacco. He reports current alcohol use of about 2.0 standard drinks of alcohol per week. He reports that he does not use drugs.  ROS: For pertinent review of systems please refer to history of present illness  Physical Exam: BP (!) 144/89   Pulse 96   Ht 6' (1.829 m)   Wt 165 lb (74.8 kg)   BMI 22.38  kg/m   Constitutional:  Well nourished. Alert and oriented, No acute distress. HEENT: [place] AT, moist mucus membranes.  Trachea midline Cardiovascular: No clubbing, cyanosis, or edema. Respiratory: Normal respiratory effort, no increased work of breathing. GU: No CVA tenderness.  No bladder fullness or masses.  Patient with circumcised phallus.  Urethral meatus is patent.  No penile discharge. No penile lesions or rashes.  Peyronie's plaque is noticed on the dorsal base of his penis.  Scrotum without lesions, cysts, rashes and/or edema.   Neurologic: Grossly intact, no focal deficits, moving all 4 extremities. Psychiatric: Normal mood and affect.   Laboratory Data:    Latest Ref Rng & Units 12/14/2022   10:44 AM 11/19/2022    2:22 PM 11/09/2022   10:27 AM  CMP  Glucose 70 - 99 mg/dL 86  101  129   BUN 8 - 23 mg/dL '9  10  10   '$ Creatinine 0.61 - 1.24 mg/dL 0.64  0.65  0.66   Sodium 135 - 145 mmol/L 121  128  131   Potassium 3.5 - 5.1 mmol/L 4.3  4.4  4.0   Chloride 98 - 111 mmol/L 87  94  96   CO2 22 - 32 mmol/L '24  27  27   '$ Calcium 8.9 - 10.3 mg/dL 8.4  8.8  8.5   Total Protein 6.5 - 8.1 g/dL 6.4   6.1   Total Bilirubin 0.3 - 1.2 mg/dL 0.4   0.3   Alkaline Phos 38 - 126 U/L 76   82   AST 15 - 41 U/L 18   19   ALT 0 - 44 U/L 12   9    TSH (12/2022) 5.836 Urinalysis  See EPIC and HPI I have reviewed the labs.   Pertinent Imaging N/A  Assessment & Plan:    1. BPH with LUTS -PSA pending -PVR < 300 cc  -most bothersome symptoms are hesitancy -continue conservative management, avoiding bladder irritants and timed voiding's -restart Cialis 5 mg daily   2. Erectile dysfunction -Testosterone level pending -Restart Cialis 5 mg daily  3.  Peyronie's disease -I explained that we cannot offer treatment options while he is in the acute phase of the plaque formation (the reason he is having pain) -We would have to wait until the pain stopped and then we can discuss treatment options     Return in 1 month (on 01/28/2023) for one month for ipss and ua.  Hildagarde Holleran, [place] [address] [place], [place] [address] [phone]

## 2022-12-30 ENCOUNTER — Other Ambulatory Visit: Payer: Self-pay

## 2022-12-30 ENCOUNTER — Ambulatory Visit (INDEPENDENT_AMBULATORY_CARE_PROVIDER_SITE_OTHER): Payer: Medicare Other | Admitting: Urology

## 2022-12-30 ENCOUNTER — Encounter: Payer: Self-pay | Admitting: Urology

## 2022-12-30 VITALS — BP 144/89 | HR 96 | Ht 72.0 in | Wt 165.0 lb

## 2022-12-30 DIAGNOSIS — N401 Enlarged prostate with lower urinary tract symptoms: Secondary | ICD-10-CM

## 2022-12-30 DIAGNOSIS — N529 Male erectile dysfunction, unspecified: Secondary | ICD-10-CM

## 2022-12-30 DIAGNOSIS — N138 Other obstructive and reflux uropathy: Secondary | ICD-10-CM

## 2022-12-30 LAB — BLADDER SCAN AMB NON-IMAGING

## 2022-12-30 MED ORDER — TADALAFIL 5 MG PO TABS: 5.0000 mg | ORAL_TABLET | Freq: Every day | ORAL | 3 refills | Status: AC | PRN

## 2022-12-31 ENCOUNTER — Encounter: Payer: Self-pay | Admitting: Family Medicine

## 2022-12-31 LAB — PSA: Prostate Specific Ag, Serum: 1.1 ng/mL (ref 0.0–4.0)

## 2022-12-31 LAB — TESTOSTERONE: Testosterone: 532 ng/dL (ref 264–916)

## 2023-01-15 ENCOUNTER — Other Ambulatory Visit: Payer: Self-pay | Admitting: Pulmonary Disease

## 2023-01-20 ENCOUNTER — Other Ambulatory Visit: Payer: Self-pay | Admitting: *Deleted

## 2023-01-20 MED ORDER — GABAPENTIN 100 MG PO CAPS: 100.0000 mg | ORAL_CAPSULE | Freq: Every day | ORAL | 3 refills | Status: DC

## 2023-01-26 NOTE — Progress Notes (Signed)
01/27/2023  11:56 AM   Paul Carpenter 18-Jul-1956 [id number]  Referring provider: Barbette Reichmann, MD [id number] [facility] [place],  [place] [address]  Chief Complaint  Patient presents with   Follow-up   Benign Prostatic Hypertrophy   Urological history: 1. ED -contributing factors of age, BPH, HTN, CAD, hypothyroidism, smoking, COPD,  BP pills,  depression and anxiety -Testosterone (12/2022) 532 -Tadalafil 5 mg daily  2. BPH with LU TS -PSA (12/2022) 1.1 -cysto 08/2020 - moderate bilateral lobe enlargement  -Tadalafil 5 mg daily  3. Nephrolithiasis -5 mm left renal stone on 06/02/2021  HPI: Paul Carpenter is a 67 y.o.  male who presents today for one month follow up.    At his visit on 12/30/2022, he would like a PSA and a testosterone today.  SHIM 16.  He has noticed some pain with erections over the last 5 to 6 months and also a new Peyronie's plaque developing at the base of his penis.  He was taking tadalafil 5 mg daily, but he has been without the medication for a month.  I PSS 23/4  PVR 0 mL   He does not recall whether or not his urinary symptoms were worse after he had been without the Cialis for 1 month.  He states that now have the urge to urinate he will go to try to urinate stand there and nothing will come out and then he will leave the toilet and then return later to void.   Patient denies any modifying or aggravating factors.  Patient denies any gross hematuria, dysuria or suprapubic/flank pain.  Patient denies any fevers, chills, nausea or vomiting.    He had an UA at his PCP's which was negative.    SHIM 17  Patient still having spontaneous erections, but the erection is only partial.  He continues to have pain with erections, but he denies curvature with erections.     SHIM     Row Name 01/27/23 1127         SHIM: Over the last 6 months:   How do you rate your confidence that you could get and keep an erection? Low     When  you had erections with sexual stimulation, how often were your erections hard enough for penetration (entering your partner)? Sometimes (about half the time)     During sexual intercourse, how often were you able to maintain your erection after you had penetrated (entered) your partner? Most Times (much more than half the time)     During sexual intercourse, how difficult was it to maintain your erection to completion of intercourse? Slightly Difficult     When you attempted sexual intercourse, how often was it satisfactory for you? Most Times (much more than half the time)       SHIM Total Score   SHIM 17                  Score: 1-7 Severe ED 8-11 Moderate ED 12-16 Mild-Moderate ED 17-21 Mild ED 22-25 No ED  I PSS 16/3  He has felt some mild improvement using the tadalafil 5 mg daily with urination.  He has been horrible to tamsulosin in the past.  He also does not want to start any medications that may have sexual side effects.  Patient denies any modifying or aggravating factors.  Patient denies any gross hematuria, dysuria or suprapubic/flank pain.  Patient denies any fevers, chills, nausea or vomiting.  IPSS     Row Name 01/27/23 1100         International Prostate Symptom Score   How often have you had the sensation of not emptying your bladder? About half the time     How often have you had to urinate less than every two hours? Less than half the time     How often have you found you stopped and started again several times when you urinated? About half the time     How often have you found it difficult to postpone urination? Less than half the time     How often have you had a weak urinary stream? Less than half the time     How often have you had to strain to start urination? Less than 1 in 5 times     How many times did you typically get up at night to urinate? 3 Times     Total IPSS Score 16       Quality of Life due to urinary symptoms   If you were to  spend the rest of your life with your urinary condition just the way it is now how would you feel about that? Mixed                  Score:  1-7 Mild 8-19 Moderate 20-35 Severe  PMH: Past Medical History:  Diagnosis Date   Anginal pain (HCC)    Anxiety    Asthma    Chest pain    CHF (congestive heart failure) (HCC)    Chicken pox    Complication of anesthesia    o2 dropped after neck fusion   COPD (chronic obstructive pulmonary disease) (HCC)    Coronary artery disease    Cough    chronic  clear phlegm   Dysrhythmia    palpitations   GERD (gastroesophageal reflux disease)    h/o reflux/ hoarsness   Hematochezia    Hemorrhoids    History of chickenpox    History of colon polyps    History of Helicobacter pylori infection    Hoarseness    Hypertension    Lung cancer (HCC) 05/2016   Chemo + rad tx's.    Migraines    OSA (obstructive sleep apnea)    has CPAP but does not use   Personal history of tobacco use, presenting hazards to health 03/05/2016   Pneumonia    5/17   Raynaud disease    Raynaud disease    Raynaud's disease    Rotator cuff tear    on right   Shortness of breath dyspnea    Sleep apnea    Ulcer (traumatic) of oral mucosa     Surgical History: Past Surgical History:  Procedure Laterality Date   BACK SURGERY     cervical fusion x 2   CARDIAC CATHETERIZATION     CERVICAL DISCECTOMY     x 2   COLONOSCOPY     COLONOSCOPY N/A 07/25/2015   Procedure: COLONOSCOPY;  Surgeon: Christena Deem, MD;  Location: [facility] ENDOSCOPY;  Service: Endoscopy;  Laterality: N/A;   COLONOSCOPY WITH PROPOFOL N/A 10/04/2017   Procedure: COLONOSCOPY WITH PROPOFOL;  Surgeon: Christena Deem, MD;  Location: [facility] ENDOSCOPY;  Service: Endoscopy;  Laterality: N/A;   COLONOSCOPY WITH PROPOFOL N/A 07/10/2020   Procedure: COLONOSCOPY WITH PROPOFOL;  Surgeon: Toledo, Boykin Nearing, MD;  Location: [facility] ENDOSCOPY;  Service: Gastroenterology;  Laterality: N/A;   ELECTROMAGNETIC  NAVIGATION BROCHOSCOPY Left 06/28/2016  Procedure: ELECTROMAGNETIC NAVIGATION BRONCHOSCOPY;  Surgeon: Stephanie Acre, MD;  Location: [facility] ORS;  Service: Cardiopulmonary;  Laterality: Left;   ENDOBRONCHIAL ULTRASOUND N/A 04/11/2018   Procedure: ENDOBRONCHIAL ULTRASOUND;  Surgeon: Erin Fulling, MD;  Location: [facility] ORS;  Service: Cardiopulmonary;  Laterality: N/A;   ESOPHAGOGASTRODUODENOSCOPY N/A 07/25/2015   Procedure: ESOPHAGOGASTRODUODENOSCOPY (EGD);  Surgeon: Christena Deem, MD;  Location: [facility] ENDOSCOPY;  Service: Endoscopy;  Laterality: N/A;   ESOPHAGOGASTRODUODENOSCOPY (EGD) WITH PROPOFOL N/A 07/10/2020   Procedure: ESOPHAGOGASTRODUODENOSCOPY (EGD) WITH PROPOFOL;  Surgeon: Toledo, Boykin Nearing, MD;  Location: [facility] ENDOSCOPY;  Service: Gastroenterology;  Laterality: N/A;   NASAL SINUS SURGERY     x 2    PORTA CATH INSERTION N/A 04/24/2018   Procedure: PORTA CATH INSERTION;  Surgeon: Annice Needy, MD;  Location: [facility] INVASIVE CV LAB;  Service: Cardiovascular;  Laterality: N/A;   rotator cuff surgery Right    07/2016   SEPTOPLASTY     SKIN GRAFT      Home Medications:  Allergies as of 01/27/2023       Reactions   Lisinopril Rash   Varenicline Rash        Medication List        Accurate as of January 27, 2023 11:56 AM. If you have any questions, ask your nurse or doctor.          acetaminophen 500 MG tablet Commonly known as: TYLENOL Take 500 mg by mouth daily as needed.   albuterol (2.5 MG/3ML) 0.083% nebulizer solution Commonly known as: PROVENTIL TAKE 3 MLS (2.5 MG TOTAL) BY NEBULIZATION 4 (FOUR) TIMES DAILY AS NEEDED FOR WHEEZING OR SHORTNESS OF BREATH.   albuterol 108 (90 Base) MCG/ACT inhaler Commonly known as: VENTOLIN HFA INHALE 2 PUFFS BY MOUTH EVERY 4 HOURS AS NEEDED FOR WHEEZE OR FOR SHORTNESS OF BREATH   atorvastatin 10 MG tablet Commonly known as: LIPITOR Take 10 mg by mouth daily.   azithromycin 500 MG tablet Commonly known as: ZITHROMAX Take 500 mg by  mouth daily.   Breztri Aerosphere 160-9-4.8 MCG/ACT Aero Generic drug: Budeson-Glycopyrrol-Formoterol Inhale 2 puffs into the lungs in the morning and at bedtime.   diazepam 5 MG tablet Commonly known as: VALIUM Take 5 mg by mouth at bedtime as needed.   dronabinol 5 MG capsule Commonly known as: MARINOL Take 1 capsule (5 mg total) by mouth 2 (two) times daily before lunch and supper.   ethambutol 400 MG tablet Commonly known as: MYAMBUTOL Take 3 tablets (1,200 mg total) by mouth daily.   ferrous sulfate 325 (65 FE) MG tablet Take 325 mg by mouth at bedtime.   gabapentin 100 MG capsule Commonly known as: Neurontin Take 1 capsule (100 mg total) by mouth at bedtime.   levothyroxine 200 MCG tablet Commonly known as: SYNTHROID Take 200 mcg by mouth daily before breakfast.   magnesium oxide 400 MG tablet Commonly known as: MAG-OX Take 600 mg by mouth 2 (two) times daily. Takes 1.5 tablet twice daily   mirtazapine 7.5 MG tablet Commonly known as: REMERON Take 1 tablet (7.5 mg total) by mouth at bedtime.   nitroGLYCERIN 0.4 MG SL tablet Commonly known as: NITROSTAT Place under the tongue.   OXYGEN Inhale 2 L into the lungs at bedtime.   pantoprazole 40 MG tablet Commonly known as: PROTONIX Take 1 tablet (40 mg total) by mouth 2 (two) times daily before meals   predniSONE 10 MG tablet Commonly known as: DELTASONE   rifampin 300 MG capsule Commonly known as: RIFADIN  Take 2 capsules (600 mg total) by mouth daily.   sodium chloride 1 g tablet Take 1 tablet (1 g total) by mouth 3 (three) times daily with meals.   tadalafil 5 MG tablet Commonly known as: CIALIS Take 1 tablet (5 mg total) by mouth daily as needed for erectile dysfunction.        Allergies:  Allergies  Allergen Reactions   Lisinopril Rash   Varenicline Rash    Family History: Family History  Problem Relation Age of Onset   Heart disease Father    Prostate cancer Father    Heart disease  Paternal Grandmother    Heart attack Maternal Grandfather 24   Kidney cancer Neg Hx    Bladder Cancer Neg Hx    Other Neg Hx        pituitary abnormality    Social History:  reports that he has been smoking cigarettes. He has a 150.00 pack-year smoking history. He has been exposed to tobacco smoke. He has never used smokeless tobacco. He reports current alcohol use of about 2.0 standard drinks of alcohol per week. He reports that he does not use drugs.  ROS: For pertinent review of systems please refer to history of present illness  Physical Exam: BP 130/78   Pulse 94   Ht 6\' 1"  (1.854 m)   Wt 165 lb (74.8 kg)   BMI 21.77 kg/m   Constitutional:  Well nourished. Alert and oriented, No acute distress. HEENT: [place] AT, moist mucus membranes.  Trachea midline Cardiovascular: No clubbing, cyanosis, or edema. Respiratory: Normal respiratory effort, no increased work of breathing. Rectal: Patient with  normal sphincter tone. Anus and perineum without scarring or rashes. No rectal masses are appreciated. Prostate is approximately 50 grams, no nodules are appreciated. Seminal vesicles could not be palpated.  Neurologic: Grossly intact, no focal deficits, moving all 4 extremities. Psychiatric: Normal mood and affect.   Laboratory Data: Serum creatinine (12/2022) 0.6 Hemoglobin A1c (12/2022) 5.6  TSH (12/2022) 8.658 Urinalysis Urinalysis w/Microscopic Order: [id number] Component Ref Range & Units 3 wk ago  Color Colorless, Straw, Light Yellow, Yellow, Dark Yellow Yellow  Clarity Clear Clear  Specific Gravity 1.005 - 1.030 1.019  pH, Urine 5.0 - 8.0 7.0  Protein, Urinalysis Negative mg/dL Negative  Glucose, Urinalysis Negative mg/dL Negative  Ketones, Urinalysis Negative mg/dL Negative  Blood, Urinalysis Negative Negative  Nitrite, Urinalysis Negative Negative  Leukocyte Esterase, Urinalysis Negative Negative  Bilirubin, Urinalysis Negative Negative  Urobilinogen,  Urinalysis 0.2 - 1.0 mg/dL 0.2  WBC, UA <=5 /hpf 1  Red Blood Cells, Urinalysis <=3 /hpf 1  Bacteria, Urinalysis 0 - 5 /hpf 0-5  Squamous Epithelial Cells, Urinalysis /hpf 0  Resulting Agency [facility]   Specimen Collected: 01/04/23 11:30   Performed by: [facility] Last Resulted: 01/04/23 11:49  Received From: [facility]  Result Received: 01/20/23 09:26  I have reviewed the labs.   Pertinent Imaging N/A  Assessment & Plan:    1. BPH with LUTS -PSA normal  -most bothersome symptoms are hesitancy -continue conservative management, avoiding bladder irritants and timed voiding's -continue Cialis 5 mg daily   2. Erectile dysfunction -Testosterone level normal -continue Cialis 5 mg daily  3.  Peyronie's disease -Follow-up in 6 months for further assessment and hopefully he is not having any further penile pain and we can go forward with further evaluation of his Peyronie's disease  Return in about 6 months (around 07/30/2023) for Peyronie's  disease recheck.  Cloretta Ned  [facility] [address] [place], [place] [address] [phone]

## 2023-01-27 ENCOUNTER — Ambulatory Visit (INDEPENDENT_AMBULATORY_CARE_PROVIDER_SITE_OTHER): Payer: Medicare Other | Admitting: Urology

## 2023-01-27 ENCOUNTER — Encounter: Payer: Self-pay | Admitting: Urology

## 2023-01-27 VITALS — BP 130/78 | HR 94 | Ht 73.0 in | Wt 165.0 lb

## 2023-01-27 DIAGNOSIS — N529 Male erectile dysfunction, unspecified: Secondary | ICD-10-CM

## 2023-01-27 DIAGNOSIS — N401 Enlarged prostate with lower urinary tract symptoms: Secondary | ICD-10-CM | POA: Diagnosis not present

## 2023-01-27 DIAGNOSIS — N486 Induration penis plastica: Secondary | ICD-10-CM | POA: Diagnosis not present

## 2023-01-31 ENCOUNTER — Other Ambulatory Visit
Admission: RE | Admit: 2023-01-31 | Discharge: 2023-01-31 | Disposition: A | Discharge status: Home / Self Care | Payer: Medicare Other | Source: Ambulatory Visit | Attending: Nephrology | Admitting: Nephrology

## 2023-01-31 DIAGNOSIS — E871 Hypo-osmolality and hyponatremia: Secondary | ICD-10-CM | POA: Diagnosis present

## 2023-01-31 LAB — BASIC METABOLIC PANEL
Anion gap: 8 (ref 5–15)
BUN: 8 mg/dL (ref 8–23)
CO2: 27 mmol/L (ref 22–32)
Calcium: 8.8 mg/dL — ABNORMAL LOW (ref 8.9–10.3)
Chloride: 90 mmol/L — ABNORMAL LOW (ref 98–111)
Creatinine, Ser: 0.56 mg/dL — ABNORMAL LOW (ref 0.61–1.24)
GFR, Estimated: >60 mL/min (ref >60)
Glucose, Bld: 100 mg/dL — ABNORMAL HIGH (ref 70–99)
Potassium: 4.4 mmol/L (ref 3.5–5.1)
Sodium: 125 mmol/L — ABNORMAL LOW (ref 135–145)

## 2023-02-02 ENCOUNTER — Ambulatory Visit
Admission: RE | Admit: 2023-02-02 | Discharge: 2023-02-02 | Disposition: A | Discharge status: Home / Self Care | Payer: Medicare Other | Source: Ambulatory Visit | Attending: Oncology | Admitting: Oncology

## 2023-02-02 DIAGNOSIS — C3491 Malignant neoplasm of unspecified part of right bronchus or lung: Secondary | ICD-10-CM

## 2023-02-02 MED ORDER — IOHEXOL 300 MG/ML  SOLN
75.0000 mL | Freq: Once | INTRAMUSCULAR | Status: AC | PRN
  Administered 2023-02-02: 75 mL via INTRAVENOUS

## 2023-02-03 ENCOUNTER — Ambulatory Visit: Admission: RE | Admit: 2023-02-03 | Payer: Medicare Other | Source: Ambulatory Visit

## 2023-02-07 ENCOUNTER — Other Ambulatory Visit: Payer: Self-pay | Admitting: *Deleted

## 2023-02-07 DIAGNOSIS — C3491 Malignant neoplasm of unspecified part of right bronchus or lung: Secondary | ICD-10-CM

## 2023-02-08 ENCOUNTER — Inpatient Hospital Stay: Payer: Medicare Other | Attending: Oncology

## 2023-02-08 ENCOUNTER — Encounter: Payer: Self-pay | Admitting: Pulmonary Disease

## 2023-02-08 ENCOUNTER — Encounter: Payer: Self-pay | Admitting: Oncology

## 2023-02-08 ENCOUNTER — Telehealth: Payer: Self-pay | Admitting: *Deleted

## 2023-02-08 ENCOUNTER — Inpatient Hospital Stay (HOSPITAL_BASED_OUTPATIENT_CLINIC_OR_DEPARTMENT_OTHER): Payer: Medicare Other | Admitting: Oncology

## 2023-02-08 ENCOUNTER — Ambulatory Visit (INDEPENDENT_AMBULATORY_CARE_PROVIDER_SITE_OTHER): Payer: Medicare Other | Admitting: Pulmonary Disease

## 2023-02-08 VITALS — BP 118/60 | HR 93 | Temp 97.6°F | Ht 73.0 in | Wt 164.0 lb

## 2023-02-08 VITALS — BP 117/80 | HR 93 | Temp 96.8°F | Resp 18 | Ht 73.0 in | Wt 162.0 lb

## 2023-02-08 DIAGNOSIS — R531 Weakness: Secondary | ICD-10-CM | POA: Diagnosis not present

## 2023-02-08 DIAGNOSIS — F17210 Nicotine dependence, cigarettes, uncomplicated: Secondary | ICD-10-CM

## 2023-02-08 DIAGNOSIS — Z79899 Other long term (current) drug therapy: Secondary | ICD-10-CM | POA: Insufficient documentation

## 2023-02-08 DIAGNOSIS — I509 Heart failure, unspecified: Secondary | ICD-10-CM | POA: Insufficient documentation

## 2023-02-08 DIAGNOSIS — E871 Hypo-osmolality and hyponatremia: Secondary | ICD-10-CM | POA: Diagnosis not present

## 2023-02-08 DIAGNOSIS — G4733 Obstructive sleep apnea (adult) (pediatric): Secondary | ICD-10-CM | POA: Diagnosis not present

## 2023-02-08 DIAGNOSIS — J439 Emphysema, unspecified: Secondary | ICD-10-CM

## 2023-02-08 DIAGNOSIS — Z923 Personal history of irradiation: Secondary | ICD-10-CM | POA: Insufficient documentation

## 2023-02-08 DIAGNOSIS — I2510 Atherosclerotic heart disease of native coronary artery without angina pectoris: Secondary | ICD-10-CM | POA: Insufficient documentation

## 2023-02-08 DIAGNOSIS — C3491 Malignant neoplasm of unspecified part of right bronchus or lung: Secondary | ICD-10-CM | POA: Diagnosis not present

## 2023-02-08 DIAGNOSIS — I429 Cardiomyopathy, unspecified: Secondary | ICD-10-CM

## 2023-02-08 DIAGNOSIS — J449 Chronic obstructive pulmonary disease, unspecified: Secondary | ICD-10-CM | POA: Diagnosis not present

## 2023-02-08 DIAGNOSIS — C342 Malignant neoplasm of middle lobe, bronchus or lung: Secondary | ICD-10-CM | POA: Diagnosis present

## 2023-02-08 DIAGNOSIS — I110 Hypertensive heart disease with heart failure: Secondary | ICD-10-CM | POA: Insufficient documentation

## 2023-02-08 DIAGNOSIS — E222 Syndrome of inappropriate secretion of antidiuretic hormone: Secondary | ICD-10-CM

## 2023-02-08 DIAGNOSIS — G4736 Sleep related hypoventilation in conditions classified elsewhere: Secondary | ICD-10-CM

## 2023-02-08 DIAGNOSIS — Z95828 Presence of other vascular implants and grafts: Secondary | ICD-10-CM

## 2023-02-08 DIAGNOSIS — A310 Pulmonary mycobacterial infection: Secondary | ICD-10-CM

## 2023-02-08 DIAGNOSIS — E039 Hypothyroidism, unspecified: Secondary | ICD-10-CM | POA: Insufficient documentation

## 2023-02-08 DIAGNOSIS — Z452 Encounter for adjustment and management of vascular access device: Secondary | ICD-10-CM | POA: Diagnosis not present

## 2023-02-08 LAB — CBC WITH DIFFERENTIAL/PLATELET
Abs Immature Granulocytes: 0.01 10*3/uL (ref 0.00–0.07)
Basophils Absolute: 0.1 10*3/uL (ref 0.0–0.1)
Basophils Relative: 1 %
Eosinophils Absolute: 0.1 10*3/uL (ref 0.0–0.5)
Eosinophils Relative: 1 %
HCT: 38.8 % — ABNORMAL LOW (ref 39.0–52.0)
Hemoglobin: 13.7 g/dL (ref 13.0–17.0)
Immature Granulocytes: 0 %
Lymphocytes Relative: 6 %
Lymphs Abs: 0.4 10*3/uL — ABNORMAL LOW (ref 0.7–4.0)
MCH: 32.5 pg (ref 26.0–34.0)
MCHC: 35.3 g/dL (ref 30.0–36.0)
MCV: 92.2 fL (ref 80.0–100.0)
Monocytes Absolute: 0.5 10*3/uL (ref 0.1–1.0)
Monocytes Relative: 7 %
Neutro Abs: 6.1 10*3/uL (ref 1.7–7.7)
Neutrophils Relative %: 85 %
Platelets: 211 10*3/uL (ref 150–400)
RBC: 4.21 MIL/uL — ABNORMAL LOW (ref 4.22–5.81)
RDW: 12 % (ref 11.5–15.5)
WBC: 7.1 10*3/uL (ref 4.0–10.5)
nRBC: 0 % (ref 0.0–0.2)

## 2023-02-08 LAB — CMP (CANCER CENTER ONLY)
ALT: 14 U/L (ref 0–44)
AST: 26 U/L (ref 15–41)
Albumin: 3.9 g/dL (ref 3.5–5.0)
Alkaline Phosphatase: 83 U/L (ref 38–126)
Anion gap: 9 (ref 5–15)
BUN: 8 mg/dL (ref 8–23)
CO2: 22 mmol/L (ref 22–32)
Calcium: 8.7 mg/dL — ABNORMAL LOW (ref 8.9–10.3)
Chloride: 92 mmol/L — ABNORMAL LOW (ref 98–111)
Creatinine: 0.66 mg/dL (ref 0.61–1.24)
GFR, Estimated: >60 mL/min (ref >60)
Glucose, Bld: 106 mg/dL — ABNORMAL HIGH (ref 70–99)
Potassium: 4 mmol/L (ref 3.5–5.1)
Sodium: 123 mmol/L — ABNORMAL LOW (ref 135–145)
Total Bilirubin: 0.5 mg/dL (ref 0.3–1.2)
Total Protein: 6.8 g/dL (ref 6.5–8.1)

## 2023-02-08 MED ORDER — SODIUM CHLORIDE 0.9% FLUSH
10.0000 mL | Freq: Once | INTRAVENOUS | Status: AC
  Administered 2023-02-08: 10 mL via INTRAVENOUS
  Filled 2023-02-08: qty 10

## 2023-02-08 MED ORDER — HEPARIN SOD (PORK) LOCK FLUSH 100 UNIT/ML IV SOLN
500.0000 [IU] | Freq: Once | INTRAVENOUS | Status: AC
  Administered 2023-02-08: 500 [IU] via INTRAVENOUS
  Filled 2023-02-08: qty 5

## 2023-02-08 NOTE — Telephone Encounter (Signed)
Called patient to inform him of his Sodium levels of 123.  Informed to pick up his Sodium tablet prescription and start taking it.

## 2023-02-08 NOTE — Progress Notes (Signed)
States he is getting weaker and not able to do much anymore. Has SOB and no strength or energy. Appetite is still not well. Wants something other than marinol. Only takes that here and there.

## 2023-02-08 NOTE — Progress Notes (Signed)
[facility]  [phone] Fax:([phone]) [phone]  ID: Paul Carpenter OB: 03-10-1956  MR#: [id number]  ZYS#:[id number]  Patient Care Team: Barbette Reichmann, MD as PCP - General (Internal Medicine) Wyn Quaker Marlow Baars, MD as Referring Physician (Vascular Surgery) Jeralyn Ruths, MD as Consulting Physician (Oncology) Carmina Miller, MD as Referring Physician (Radiation Oncology) Salena Saner, MD as Consulting Physician (Pulmonary Disease) Lynn Ito, MD as Consulting Physician (Infectious Diseases)   CHIEF COMPLAINT: Recurrent stage IIIa non-small cell lung cancer, right middle lobe.  INTERVAL HISTORY: Patient returns to clinic today for repeat laboratory work, discussion of his imaging results, and further evaluation.  He continues to have chronic weakness and fatigue.  He has a poor appetite and depression.  He has chronic cough and shortness of breath. He denies any hemoptysis.  He has no neurologic complaints.  He denies any fevers. He denies any nausea, vomiting, constipation, or diarrhea. He has no urinary complaints.  Patient feels generally terrible, but offers no further specific complaints today.  REVIEW OF SYSTEMS:   Review of Systems  Constitutional:  Positive for malaise/fatigue and weight loss. Negative for fever.  HENT: Negative.  Negative for congestion and sore throat.   Respiratory:  Positive for cough and shortness of breath. Negative for hemoptysis.   Cardiovascular: Negative.  Negative for chest pain and leg swelling.  Gastrointestinal: Negative.  Negative for abdominal pain, blood in stool, melena and nausea.  Genitourinary: Negative.  Negative for dysuria.  Musculoskeletal: Negative.  Negative for joint pain and neck pain.  Skin: Negative.  Negative for rash.  Neurological:  Positive for weakness. Negative for dizziness, sensory change, focal weakness and headaches.  Psychiatric/Behavioral:  Positive for depression. The  patient is nervous/anxious.     As per HPI. Otherwise, a complete review of systems is negative.  PAST MEDICAL HISTORY: Past Medical History:  Diagnosis Date   Anginal pain    Anxiety    Asthma    Chest pain    CHF (congestive heart failure)    Chicken pox    Complication of anesthesia    o2 dropped after neck fusion   COPD (chronic obstructive pulmonary disease)    Coronary artery disease    Cough    chronic  clear phlegm   Dysrhythmia    palpitations   GERD (gastroesophageal reflux disease)    h/o reflux/ hoarsness   Hematochezia    Hemorrhoids    History of chickenpox    History of colon polyps    History of Helicobacter pylori infection    Hoarseness    Hypertension    Lung cancer 05/2016   Chemo + rad tx's.    Migraines    OSA (obstructive sleep apnea)    has CPAP but does not use   Personal history of tobacco use, presenting hazards to health 03/05/2016   Pneumonia    5/17   Raynaud disease    Raynaud disease    Raynaud's disease    Rotator cuff tear    on right   Shortness of breath dyspnea    Sleep apnea    Ulcer (traumatic) of oral mucosa     PAST SURGICAL HISTORY: Past Surgical History:  Procedure Laterality Date   BACK SURGERY     cervical fusion x 2   CARDIAC CATHETERIZATION     CERVICAL DISCECTOMY     x 2   COLONOSCOPY     COLONOSCOPY N/A 07/25/2015   Procedure: COLONOSCOPY;  Surgeon: Cindra Eves  Marva Panda, MD;  Location: [facility] ENDOSCOPY;  Service: Endoscopy;  Laterality: N/A;   COLONOSCOPY WITH PROPOFOL N/A 10/04/2017   Procedure: COLONOSCOPY WITH PROPOFOL;  Surgeon: Christena Deem, MD;  Location: [facility] ENDOSCOPY;  Service: Endoscopy;  Laterality: N/A;   COLONOSCOPY WITH PROPOFOL N/A 07/10/2020   Procedure: COLONOSCOPY WITH PROPOFOL;  Surgeon: Toledo, Boykin Nearing, MD;  Location: [facility] ENDOSCOPY;  Service: Gastroenterology;  Laterality: N/A;   ELECTROMAGNETIC NAVIGATION BROCHOSCOPY Left 06/28/2016   Procedure: ELECTROMAGNETIC NAVIGATION BRONCHOSCOPY;   Surgeon: Stephanie Acre, MD;  Location: [facility] ORS;  Service: Cardiopulmonary;  Laterality: Left;   ENDOBRONCHIAL ULTRASOUND N/A 04/11/2018   Procedure: ENDOBRONCHIAL ULTRASOUND;  Surgeon: Erin Fulling, MD;  Location: [facility] ORS;  Service: Cardiopulmonary;  Laterality: N/A;   ESOPHAGOGASTRODUODENOSCOPY N/A 07/25/2015   Procedure: ESOPHAGOGASTRODUODENOSCOPY (EGD);  Surgeon: Christena Deem, MD;  Location: [facility] ENDOSCOPY;  Service: Endoscopy;  Laterality: N/A;   ESOPHAGOGASTRODUODENOSCOPY (EGD) WITH PROPOFOL N/A 07/10/2020   Procedure: ESOPHAGOGASTRODUODENOSCOPY (EGD) WITH PROPOFOL;  Surgeon: Toledo, Boykin Nearing, MD;  Location: [facility] ENDOSCOPY;  Service: Gastroenterology;  Laterality: N/A;   NASAL SINUS SURGERY     x 2    PORTA CATH INSERTION N/A 04/24/2018   Procedure: PORTA CATH INSERTION;  Surgeon: Annice Needy, MD;  Location: [facility] INVASIVE CV LAB;  Service: Cardiovascular;  Laterality: N/A;   rotator cuff surgery Right    07/2016   SEPTOPLASTY     SKIN GRAFT      FAMILY HISTORY Family History  Problem Relation Age of Onset   Heart disease Father    Prostate cancer Father    Heart disease Paternal Grandmother    Heart attack Maternal Grandfather 66   Kidney cancer Neg Hx    Bladder Cancer Neg Hx    Other Neg Hx        pituitary abnormality       ADVANCED DIRECTIVES:    HEALTH MAINTENANCE: Social History   Tobacco Use   Smoking status: Every Day    Packs/day: 3.00    Years: 50.00    Additional pack years: 0.00    Total pack years: 150.00    Types: Cigarettes    Passive exposure: Current   Smokeless tobacco: Never   Tobacco comments:    2 PPD 02/08/2023  Vaping Use   Vaping Use: Never used  Substance Use Topics   Alcohol use: Yes    Alcohol/week: 2.0 standard drinks of alcohol    Types: 2 Standard drinks or equivalent per week    Comment: moderate   Drug use: No     Allergies  Allergen Reactions   Lisinopril Rash   Varenicline Rash    Current Outpatient  Medications  Medication Sig Dispense Refill   acetaminophen (TYLENOL) 500 MG tablet Take 500 mg by mouth daily as needed.     albuterol (PROVENTIL) (2.5 MG/3ML) 0.083% nebulizer solution TAKE 3 MLS (2.5 MG TOTAL) BY NEBULIZATION 4 (FOUR) TIMES DAILY AS NEEDED FOR WHEEZING OR SHORTNESS OF BREATH. 90 mL 2   albuterol (VENTOLIN HFA) 108 (90 Base) MCG/ACT inhaler INHALE 2 PUFFS BY MOUTH EVERY 4 HOURS AS NEEDED FOR WHEEZE OR FOR SHORTNESS OF BREATH 8.5 each 2   atorvastatin (LIPITOR) 10 MG tablet Take 10 mg by mouth daily.     azithromycin (ZITHROMAX) 500 MG tablet Take 500 mg by mouth daily.     Budeson-Glycopyrrol-Formoterol (BREZTRI AEROSPHERE) 160-9-4.8 MCG/ACT AERO Inhale 2 puffs into the lungs in the morning and at bedtime. 5.9 g 0   diazepam (VALIUM)  5 MG tablet Take 5 mg by mouth at bedtime as needed.     dronabinol (MARINOL) 5 MG capsule Take 1 capsule (5 mg total) by mouth 2 (two) times daily before lunch and supper. 60 capsule 0   ethambutol (MYAMBUTOL) 400 MG tablet Take 3 tablets (1,200 mg total) by mouth daily. 90 tablet 3   levothyroxine (SYNTHROID) 200 MCG tablet Take 200 mcg by mouth daily before breakfast.     magnesium oxide (MAG-OX) 400 MG tablet Take 600 mg by mouth 2 (two) times daily. Takes 1.5 tablet twice daily     nitroGLYCERIN (NITROSTAT) 0.4 MG SL tablet Place under the tongue.     OXYGEN Inhale 2 L into the lungs at bedtime.     pantoprazole (PROTONIX) 40 MG tablet Take 1 tablet (40 mg total) by mouth 2 (two) times daily before meals     rifampin (RIFADIN) 300 MG capsule Take 2 capsules (600 mg total) by mouth daily. 60 capsule 3   sodium chloride 1 g tablet Take 1 tablet (1 g total) by mouth 3 (three) times daily with meals. 90 tablet 1   tadalafil (CIALIS) 5 MG tablet Take 1 tablet (5 mg total) by mouth daily as needed for erectile dysfunction. 90 tablet 3   ferrous sulfate 325 (65 FE) MG tablet Take 325 mg by mouth at bedtime.     gabapentin (NEURONTIN) 100 MG capsule  Take 1 capsule (100 mg total) by mouth at bedtime. 30 capsule 3   No current facility-administered medications for this visit.    OBJECTIVE: Vitals:   02/08/23 1003  BP: 117/80  Pulse: 93  Resp: 18  Temp: (!) 96.8 F (36 C)  SpO2: 100%      Body mass index is 21.37 kg/m.    ECOG FS:2 - Symptomatic, <50% confined to bed  General: Ill-appearing, no acute distress. Eyes: Pink conjunctiva, anicteric sclera. HEENT: Normocephalic, moist mucous membranes. Lungs: No audible wheezing or coughing. Heart: Regular rate and rhythm. Abdomen: Soft, nontender, no obvious distention. Musculoskeletal: No edema, cyanosis, or clubbing. Neuro: Alert, answering all questions appropriately. Cranial nerves grossly intact. Skin: No rashes or petechiae noted. Psych: Flat affect.   LAB RESULTS:  Lab Results  Component Value Date   NA 123 (L) 02/08/2023   K 4.0 02/08/2023   CL 92 (L) 02/08/2023   CO2 22 02/08/2023   GLUCOSE 106 (H) 02/08/2023   BUN 8 02/08/2023   CREATININE 0.66 02/08/2023   CALCIUM 8.7 (L) 02/08/2023   PROT 6.8 02/08/2023   ALBUMIN 3.9 02/08/2023   AST 26 02/08/2023   ALT 14 02/08/2023   ALKPHOS 83 02/08/2023   BILITOT 0.5 02/08/2023   GFRNONAA >60 02/08/2023   GFRAA >60 04/24/2020    Lab Results  Component Value Date   WBC 7.1 02/08/2023   NEUTROABS 6.1 02/08/2023   HGB 13.7 02/08/2023   HCT 38.8 (L) 02/08/2023   MCV 92.2 02/08/2023   PLT 211 02/08/2023   Lab Results  Component Value Date   IRON 49 12/28/2021   TIBC 241 (L) 12/28/2021   IRONPCTSAT 20 12/28/2021   Lab Results  Component Value Date   FERRITIN 318 11/20/2021     STUDIES:  ONCOLOGY HISTORY: Case discussed with pathologist and unable to determine whether this is adenocarcinoma or squamous cell carcinoma.  There is also insufficient tissue to do further testing.  Liquid biopsy did not reveal any actionable mutations.  MRI of the brain completed on Mar 28, 2018 reviewed independently  did not  reveal metastatic disease.  Patient completed XRT June 26, 2018.  He completed his concurrent single agent carboplatinum on June 21, 2018.  Patient had a reaction to Taxol during cycle 1 and this was discontinued.  He completed a year-long of maintenance durvalumab on June 27, 2019.   PET scan results from November 16, 2021 reviewed independently with once again suspicion of progression of disease.  After lengthy discussion with the patient, it was agreed that no biopsy is necessary and may be too difficult given his recent C2 cervical fracture.  Patient and family expressed interest in systemic chemotherapy.  Patient received his fourth and final cycle of Taxotere on January 28, 2022.    ASSESSMENT: Recurrent stage IIIa non-small cell lung cancer, right middle lobe.  PLAN:    Recurrent stage IIIa non-small cell lung cancer, right middle lobe: See oncology history above.  CT scan results from February 02, 2023 reviewed independently and report as above.  His persistent abnormalities are likely related to underlying MAC infection and not malignancy.  No treatment are planned at this time.  Patient does not require PET scan.  Continue close follow-up with infectious disease and pulmonary.  Return to clinic in 6 months with repeat imaging and further evaluation.   MAC infection: Continue triple antibiotic therapy.  Follow-up with ID as scheduled. Iron deficiency anemia: Resolved.  He last received IV Venofer on December 03, 2021.   Colon polyps:  Patient has a personal history of greater then 10 adenomatous polyps on his most recent conoloscopy. He does not know of any family history of increased polyps or colon cancer.  Genetic testing to assess for the APC mutation for FAP or AFAP was negative. Continue colonoscopies as per GI. Tobacco Use: Chronic and unchanged.  Patient continues to smoke on a daily basis.  He previously expressed understanding by continuing tobacco use increases his chance of  recurrence. Anxiety/depression: Chronic and unchanged.  Continue Valium 10 mg every 12 hours as needed.  Have recommended reinitiating antidepressant, but patient is hesitant to do so stating worries of his sodium level.   Hyponatremia: Patient's sodium level is slightly worse today at 123.  Unclear patient's compliance with salt tablets.  Patient was previously counseled on alcohol consumption and how it can act as a diuretic and reduce sodium levels.  Follow-up with nephrology as scheduled. Hypothyroidism: Appreciate endocrinology input.  Continue Synthroid as prescribed.  Follow-up with endocrinology as scheduled. CHF: Patient's most recent cardiac echo on November 10, 2022 revealed an EF of 37% which is slightly decreased from previous.  Continue follow-up with cardiology.   History of C2 cervical fracture: Patient had repeat imaging on May 03, 2022 that revealed complete resolution of fracture.   Cough/shortness of breath: Chronic and unchanged.  Continue treatment for MAC as above.  Patient also continues to smoke.  Patient expressed understanding and was in agreement with this plan. He also understands that He can call clinic at any time with any questions, concerns, or complaints.    Jeralyn Ruths, MD   02/08/2023 12:06 PM

## 2023-02-08 NOTE — Progress Notes (Unsigned)
Subjective:    Patient ID: Paul Carpenter, male    DOB: [birth year], 67 y.o.   MRN: [id number] Patient Care Team: Barbette Reichmann, MD as PCP - General (Internal Medicine) Wyn Quaker Marlow Baars, MD as Referring Physician (Vascular Surgery) Jeralyn Ruths, MD as Consulting Physician (Oncology) Carmina Miller, MD as Referring Physician (Radiation Oncology) Salena Saner, MD as Consulting Physician (Pulmonary Disease) Lynn Ito, MD as Consulting Physician (Infectious Diseases)  Chief Complaint  Patient presents with   Follow-up    Prod cough with green sputum and SOB with exertion.    HPI Patient is a very complex 67 year old current smoker (2 PPD, 150 PY) with a history as noted below who presents for follow-up of a cavitary right upper lobe process in the setting of history of non-small cell carcinoma of the lung, stage II COPD and dyspnea/fatigue.  Patient was noted to have recurrence of stage IIIa non-small cell lung cancer of the right middle lobe.  It was suspicion of progression of disease on his most recent PET/CT of 16 November 2021.  Patient at that time could not undergo procedure such as bronchoscopy due to recent C2 cervical fracture.  He received a final cycle of Taxotere January 28, 2022.  On the 04 February 2022 he was admitted for observation due to acute on chronic hypoxemic respiratory failure, at that time patient was having issues with fever up to 102.4 and purulent sputum production.  A CT of the chest at that time was performed to assess for PE.  This showed progressive multi cavitary lesion occupying a good portion of the right upper lobe and enlarging right infrahilar mediastinal lymph nodes and several small solid pulmonary nodules.  There was also patchy nodular airspace disease with surrounding tree-in-bud appearance (this has been present since as far back as 2016) there are also severe underlying emphysematous changes.  Because of these findings MAC was  suspected and infectious disease and pulmonary disease consultants were asked to render opinion. The patient was seen by Dr. Rivka Safer and by Dr. Karna Christmas.  The patient requested early discharge home and was sent home on Augmentin and azithromycin for suspected community-acquired pneumonia and possible MAC.  Patient was discharged the following day.  Subsequently the patient saw me on 09 February 2022 as Dr. Belia Heman, his primary pulmonologist, was unavailable.  At that time he continued to have issues with malaise and right-sided pleuritic chest pain.  Since his discharge microbiology was resulted and the patient is growing Pseudomonas aeruginosa.  The patient was treated for Pseudomonas aeruginosa and improved somewhat.  He then had recurrence of cough with sputum production after that therapy and sputum was recollected this time showing M avium complex. The patient is on triple M avium therapy directed by infectious diseases.  Patient presents today for follow-up he was last seen by me on 04 November 2022 at that time he was directed to stop smoking, he was instructed to continue [place], continue as needed albuterol.  He was also instructed to continue his nocturnal oxygen.  He was also noted order to have an echocardiogram which showed that his LVEF was decreased to 35 to 40% with global hypokinesis.  He was referred back to his cardiologist at [facility]  He was evaluated on 21 March but no interventions have been recommended.  This may be adding to the patient's dyspnea and fatigue.  Today he presents with his usual symptoms of dyspnea, fatigue and in general not feeling well.  He cannot  specify what not feeling well as.  He continues to smoke 2 packs of cigarettes per day continues to imbibe alcohol.  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  Has not had any hemoptysis.He has no neurologic complaints.  He denies any fevers,chills or sweats. He denies any  nausea, vomiting, constipation, or diarrhea. He has no urinary complaints.   He is on oxygen at 2.5 L/min has a history of sleep apnea but cannot tolerate CPAP and has not been on this for years.  He does not endorse any other new complaint.  He had a CT chest on 02 February 2023 that shows a stable large thick-walled cavitary mass involving the right upper lobe, innumerable solid pulmonary nodules findings likely due to superimposed nontuberculous Mycobacterium infection.  Stable enlarged right hilar node chronic rib fractures on the right.  This film was independently reviewed and shown to the patient and his wife who is with the patient during the visit.  Review of Systems A 10 point review of systems was performed and it is as noted above otherwise negative.  Patient Active Problem List   Diagnosis Date Noted   Hyponatremia 09/11/2022   Generalized weakness 09/10/2022   Shortness of breath 02/03/2022   SIADH (syndrome of inappropriate ADH production) 09/22/2021   Overweight (BMI 25.0-29.9) 09/22/2021   Hypomagnesemia 09/21/2021   Elevated hemoglobin A1c 06/19/2020   Hypothyroidism, acquired 05/30/2019   Squamous cell lung cancer, right 05/30/2019   HFrEF (heart failure with reduced ejection fraction) 03/06/2019   Atherosclerosis 12/14/2018   Non-small cell lung cancer, right 04/24/2018   Oral ulcer 02/16/2018   Pituitary disorder 02/16/2018   Migraine headache 03/07/2017   HCAP (healthcare-associated pneumonia) 09/11/2016   COPD exacerbation 09/11/2016   Chronic hyponatremia 09/11/2016   Leukocytosis 09/11/2016   Thrombocytopenia 09/11/2016   Cigarette smoker 06/11/2016   Cervical radiculopathy 04/15/2016   Cervical disc disorder at C5-C6 level with radiculopathy 03/09/2016   Impingement syndrome of right shoulder 03/09/2016   Health care maintenance 09/29/2015   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   Benign essential hypertension 05/28/2015   Polycythemia 03/24/2015   Carotid artery disease  12/12/2014   Disequilibrium 12/12/2014   Mixed hyperlipidemia 12/10/2014   Incomplete emptying of bladder 06/04/2014   Anxiety 05/18/2014   Chronic coronary artery disease 05/18/2014   Chronic headaches 05/18/2014   Acute shoulder pain 03/15/2014   Impingement syndrome of left shoulder 03/15/2014   Lung mass 12/06/2013   Kidney stone 11/24/2013   COPD (chronic obstructive pulmonary disease) 04/24/2013   Obstructive sleep apnea 04/24/2013   Benign localized prostatic hyperplasia with lower urinary tract symptoms (LUTS) 07/04/2012   Encounter for long-term current use of medication 07/04/2012   ED (erectile dysfunction) of organic origin 07/04/2012   Testicular hypofunction 07/04/2012   Social History   Tobacco Use   Smoking status: Every Day    Packs/day: 3.00    Years: 50.00    Additional pack years: 0.00    Total pack years: 150.00    Types: Cigarettes    Passive exposure: Current   Smokeless tobacco: Never   Tobacco comments:    2 PPD 11/04/2022   Substance Use Topics   Alcohol use: Yes    Alcohol/week: 2.0 standard drinks of alcohol    Types: 2 Standard drinks or equivalent per week    Comment: moderate   Current Meds  Medication Sig   acetaminophen (TYLENOL) 500 MG tablet Take 500 mg by mouth  daily as needed.   albuterol (PROVENTIL) (2.5 MG/3ML) 0.083% nebulizer solution TAKE 3 MLS (2.5 MG TOTAL) BY NEBULIZATION 4 (FOUR) TIMES DAILY AS NEEDED FOR WHEEZING OR SHORTNESS OF BREATH.   albuterol (VENTOLIN HFA) 108 (90 Base) MCG/ACT inhaler INHALE 2 PUFFS BY MOUTH EVERY 4 HOURS AS NEEDED FOR WHEEZE OR FOR SHORTNESS OF BREATH   atorvastatin (LIPITOR) 10 MG tablet Take 10 mg by mouth daily.   azithromycin (ZITHROMAX) 500 MG tablet Take 500 mg by mouth daily.   Budeson-Glycopyrrol-Formoterol (BREZTRI AEROSPHERE) 160-9-4.8 MCG/ACT AERO Inhale 2 puffs into the lungs in the morning and at bedtime.   diazepam (VALIUM) 5 MG tablet Take 5 mg by mouth at bedtime as needed.    dronabinol (MARINOL) 5 MG capsule Take 1 capsule (5 mg total) by mouth 2 (two) times daily before lunch and supper.   ethambutol (MYAMBUTOL) 400 MG tablet Take 3 tablets (1,200 mg total) by mouth daily.   ferrous sulfate 325 (65 FE) MG tablet Take 325 mg by mouth at bedtime.   gabapentin (NEURONTIN) 100 MG capsule Take 1 capsule (100 mg total) by mouth at bedtime.   levothyroxine (SYNTHROID) 200 MCG tablet Take 200 mcg by mouth daily before breakfast.   magnesium oxide (MAG-OX) 400 MG tablet Take 600 mg by mouth 2 (two) times daily. Takes 1.5 tablet twice daily   nitroGLYCERIN (NITROSTAT) 0.4 MG SL tablet Place under the tongue.   OXYGEN Inhale 2 L into the lungs at bedtime.   pantoprazole (PROTONIX) 40 MG tablet Take 1 tablet (40 mg total) by mouth 2 (two) times daily before meals   rifampin (RIFADIN) 300 MG capsule Take 2 capsules (600 mg total) by mouth daily.   sodium chloride 1 g tablet Take 1 tablet (1 g total) by mouth 3 (three) times daily with meals.   tadalafil (CIALIS) 5 MG tablet Take 1 tablet (5 mg total) by mouth daily as needed for erectile dysfunction.   Immunization History  Administered Date(s) Administered   Fluad Quad(high Dose 65+) 09/22/2020   Influenza Inj Mdck Quad Pf 09/06/2016, 10/02/2019, 09/09/2021, 09/03/2022   Influenza Split 08/05/2013, 07/23/2014, 08/18/2015   Influenza Whole 08/01/2012   Influenza,inj,Quad PF,6+ Mos 08/14/2018   Influenza-Unspecified 09/06/2016, 08/17/2017, 09/09/2021   Zoster Recombinat (Shingrix) 06/19/2020       Objective:   Physical Exam BP 118/60 (BP Location: Left Arm, Cuff Size: Normal)   Pulse 93   SpO2 97%   SpO2: 97 % O2 Device: None (Room air)  GENERAL: Chronically ill appearing gentleman, ashen color, well nourished.  Mild tachypnea at rest.  Fully ambulatory.  No conversational dyspnea. HEAD: Normocephalic, atraumatic.  EYES: Pupils equal, round, reactive to light.  No scleral icterus.  MOUTH: Oral mucosa moist.  No  thrush. NECK: Supple. No thyromegaly. Trachea midline. No JVD.  No adenopathy. PULMONARY: Good air entry bilaterally.  Coarse, otherwise, no adventitious sounds. CARDIOVASCULAR: S1 and S2. Regular rate and rhythm.  No rubs, murmurs or gallops heard. ABDOMEN: Benign. MUSCULOSKELETAL: No joint deformity, no clubbing, no edema.  NEUROLOGIC: Mild psychomotor retardation.  No overt focal deficit.  No gait disturbance.  Speech is fluent. SKIN: Intact,warm,dry. PSYCH: Depressed mood and flat affect.        Assessment & Plan:     ICD-10-CM   1. Stage 2 moderate COPD by GOLD classification  J44.9    Breztri 2 puffs twice a day Continue as needed albuterol STOP SMOKING!!    2. 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 Cardiomyopathy, unspecified type  I42.9    LVEF 35 to 40% Patient follows with cardiology This issue adds complexity to his management    5. SIADH (syndrome of inappropriate ADH production)  E22.2    Multifactorial etiology Could be due to MAC/MAC therapy Patient being managed by oncology    6. Squamous cell lung cancer, right  C34.91    No apparent recurrence    7. Tobacco dependence due to cigarettes  F17.210    Patient counseled regards discontinuation of smoking Patient not motivated to quit Total counseling time 3 to 5 minutes     Will see the patient in follow-up in 3 months time call sooner should any new problems arise.  Gailen Shelter, MD Advanced Bronchoscopy PCCM  [facility]    *This note was dictated using voice recognition software/Dragon.  Despite best efforts to proofread, errors can occur which can change the meaning. Any transcriptional errors that result from this process are unintentional and may not be fully corrected at the time of dictation.

## 2023-02-08 NOTE — Patient Instructions (Signed)
Please make an effort to quit smoking.  Continue Breztri 2 puffs twice a day, continue your rescue inhaler.  If you think you need stronger rescue inhaler use the nebulizer instead.  We will see you in follow-up in 3 months time call sooner should any new problems arise.

## 2023-02-09 ENCOUNTER — Emergency Department
Admission: EM | Admit: 2023-02-09 | Discharge: 2023-02-09 | Disposition: A | Discharge status: Home / Self Care | Payer: Medicare Other | Attending: Emergency Medicine | Admitting: Emergency Medicine

## 2023-02-09 ENCOUNTER — Other Ambulatory Visit: Payer: Self-pay

## 2023-02-09 ENCOUNTER — Emergency Department: Payer: Medicare Other

## 2023-02-09 DIAGNOSIS — Z79899 Other long term (current) drug therapy: Secondary | ICD-10-CM | POA: Insufficient documentation

## 2023-02-09 DIAGNOSIS — E871 Hypo-osmolality and hyponatremia: Secondary | ICD-10-CM | POA: Diagnosis not present

## 2023-02-09 DIAGNOSIS — R079 Chest pain, unspecified: Secondary | ICD-10-CM | POA: Diagnosis present

## 2023-02-09 DIAGNOSIS — I110 Hypertensive heart disease with heart failure: Secondary | ICD-10-CM | POA: Insufficient documentation

## 2023-02-09 DIAGNOSIS — I5020 Unspecified systolic (congestive) heart failure: Secondary | ICD-10-CM | POA: Insufficient documentation

## 2023-02-09 DIAGNOSIS — R0789 Other chest pain: Secondary | ICD-10-CM | POA: Diagnosis not present

## 2023-02-09 DIAGNOSIS — I2510 Atherosclerotic heart disease of native coronary artery without angina pectoris: Secondary | ICD-10-CM | POA: Diagnosis not present

## 2023-02-09 DIAGNOSIS — Z85118 Personal history of other malignant neoplasm of bronchus and lung: Secondary | ICD-10-CM | POA: Diagnosis not present

## 2023-02-09 DIAGNOSIS — Z452 Encounter for adjustment and management of vascular access device: Secondary | ICD-10-CM | POA: Diagnosis not present

## 2023-02-09 DIAGNOSIS — R531 Weakness: Secondary | ICD-10-CM | POA: Diagnosis not present

## 2023-02-09 DIAGNOSIS — J449 Chronic obstructive pulmonary disease, unspecified: Secondary | ICD-10-CM | POA: Diagnosis not present

## 2023-02-09 LAB — CBC WITH DIFFERENTIAL/PLATELET
Abs Immature Granulocytes: 0.01 10*3/uL (ref 0.00–0.07)
Basophils Absolute: 0.1 10*3/uL (ref 0.0–0.1)
Basophils Relative: 1 %
Eosinophils Absolute: 0.1 10*3/uL (ref 0.0–0.5)
Eosinophils Relative: 1 %
HCT: 35.6 % — ABNORMAL LOW (ref 39.0–52.0)
Hemoglobin: 12.4 g/dL — ABNORMAL LOW (ref 13.0–17.0)
Immature Granulocytes: 0 %
Lymphocytes Relative: 8 %
Lymphs Abs: 0.5 10*3/uL — ABNORMAL LOW (ref 0.7–4.0)
MCH: 32.2 pg (ref 26.0–34.0)
MCHC: 34.8 g/dL (ref 30.0–36.0)
MCV: 92.5 fL (ref 80.0–100.0)
Monocytes Absolute: 0.6 10*3/uL (ref 0.1–1.0)
Monocytes Relative: 9 %
Neutro Abs: 5.1 10*3/uL (ref 1.7–7.7)
Neutrophils Relative %: 81 %
Platelets: 195 10*3/uL (ref 150–400)
RBC: 3.85 MIL/uL — ABNORMAL LOW (ref 4.22–5.81)
RDW: 12.1 % (ref 11.5–15.5)
WBC: 6.3 10*3/uL (ref 4.0–10.5)
nRBC: 0 % (ref 0.0–0.2)

## 2023-02-09 LAB — COMPREHENSIVE METABOLIC PANEL
ALT: 13 U/L (ref 0–44)
AST: 22 U/L (ref 15–41)
Albumin: 3.9 g/dL (ref 3.5–5.0)
Alkaline Phosphatase: 74 U/L (ref 38–126)
Anion gap: 6 (ref 5–15)
BUN: 13 mg/dL (ref 8–23)
CO2: 27 mmol/L (ref 22–32)
Calcium: 8.7 mg/dL — ABNORMAL LOW (ref 8.9–10.3)
Chloride: 94 mmol/L — ABNORMAL LOW (ref 98–111)
Creatinine, Ser: 0.61 mg/dL (ref 0.61–1.24)
GFR, Estimated: >60 mL/min (ref >60)
Glucose, Bld: 85 mg/dL (ref 70–99)
Potassium: 4.2 mmol/L (ref 3.5–5.1)
Sodium: 127 mmol/L — ABNORMAL LOW (ref 135–145)
Total Bilirubin: 0.5 mg/dL (ref 0.3–1.2)
Total Protein: 6.4 g/dL — ABNORMAL LOW (ref 6.5–8.1)

## 2023-02-09 LAB — URINALYSIS, ROUTINE W REFLEX MICROSCOPIC
Bilirubin Urine: NEGATIVE
Glucose, UA: NEGATIVE mg/dL
Hgb urine dipstick: NEGATIVE
Ketones, ur: NEGATIVE mg/dL
Leukocytes,Ua: NEGATIVE
Nitrite: NEGATIVE
Protein, ur: NEGATIVE mg/dL
Specific Gravity, Urine: 1.013 (ref 1.005–1.030)
pH: 6 (ref 5.0–8.0)

## 2023-02-09 LAB — TROPONIN I (HIGH SENSITIVITY): Troponin I (High Sensitivity): 5 ng/L (ref <18)

## 2023-02-09 LAB — MAGNESIUM: Magnesium: 1.7 mg/dL (ref 1.7–2.4)

## 2023-02-09 NOTE — ED Triage Notes (Signed)
Pt presents to ER with c/o weakness that started Monday and chest pain that has been going on for a few days.  Pt states he is currently on abx for MAC that he started in June of 2023.  Pt also states he has hx of lung cancer, but has been off chemo since March of 2023.  Pt states he usually gets the increased chest pain and weakness when his sodium levels drop.  Pt had CMP yesterday which showed sodium of 123.  Pt denies any n/v/d.  Pt reports adequate PO intake.  Pt is otherwise A&O x4, but appears weak in triage.

## 2023-02-09 NOTE — Discharge Instructions (Addendum)
Continue your medications as directed by your doctor. Return to the ER for worsening symptoms, vomiting, difficulty breathing or other concerns.

## 2023-02-09 NOTE — ED Provider Notes (Signed)
[facility] Provider Note    Event Date/Time   First MD Initiated Contact with Patient 02/09/23 2306     (approximate)   History   Chest Pain and Weakness   HPI  Paul Carpenter is a 67 y.o. male brought to the emerrgency department from home by his wife with a chief complaint of chest pain and generalized weakness.  Patient with a history of hyponatremia who states he feels weak when his sodium is low.  He was seen at the cancer center yesterday with sodium of 123.  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  Chest pain usually comes on when he is feeling weak and his sodium levels dropped.  Denies fever/chills, cough, shortness of breath, abdominal pain, nausea, vomiting or dizziness.     Past Medical History   Past Medical History:  Diagnosis Date   Anginal pain    Anxiety    Asthma    Chest pain    CHF (congestive heart failure)    Chicken pox    Complication of anesthesia    o2 dropped after neck fusion   COPD (chronic obstructive pulmonary disease)    Coronary artery disease    Cough    chronic  clear phlegm   Dysrhythmia    palpitations   GERD (gastroesophageal reflux disease)    h/o reflux/ hoarsness   Hematochezia    Hemorrhoids    History of chickenpox    History of colon polyps    History of Helicobacter pylori infection    Hoarseness    Hypertension    Lung cancer 05/2016   Chemo + rad tx's.    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   Overweight (BMI 25.0-29.9) 09/22/2021   Hypomagnesemia 09/21/2021   Elevated hemoglobin A1c 06/19/2020   Hypothyroidism, acquired 05/30/2019   Squamous cell lung cancer, right 05/30/2019   HFrEF (heart failure with reduced ejection fraction) 03/06/2019   Atherosclerosis 12/14/2018   Non-small cell lung cancer, right 04/24/2018   Oral ulcer 02/16/2018   Pituitary disorder 02/16/2018   Migraine headache 03/07/2017   HCAP (healthcare-associated pneumonia) 09/11/2016   COPD exacerbation 09/11/2016   Chronic hyponatremia 09/11/2016   Leukocytosis 09/11/2016   Thrombocytopenia 09/11/2016   Cigarette smoker 06/11/2016   Cervical radiculopathy 04/15/2016   Cervical disc disorder at C5-C6 level with radiculopathy 03/09/2016   Impingement syndrome of right shoulder 03/09/2016   Health care maintenance 09/29/2015   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   Benign essential hypertension 05/28/2015   Polycythemia 03/24/2015   Carotid artery disease 12/12/2014   Disequilibrium 12/12/2014   Mixed hyperlipidemia 12/10/2014   Incomplete emptying of bladder 06/04/2014   Anxiety 05/18/2014   Chronic coronary artery disease 05/18/2014   Chronic headaches 05/18/2014   Acute shoulder pain 03/15/2014  Impingement syndrome of left shoulder 03/15/2014   Lung mass 12/06/2013   Kidney stone 11/24/2013   COPD (chronic obstructive pulmonary disease) 04/24/2013   Obstructive sleep apnea 04/24/2013   Benign localized prostatic hyperplasia with lower urinary tract symptoms (LUTS) 07/04/2012   Encounter for long-term current use of medication 07/04/2012   ED (erectile dysfunction) of organic origin 07/04/2012   Testicular hypofunction 07/04/2012     Past Surgical History   Past Surgical History:  Procedure Laterality Date   BACK SURGERY     cervical fusion x 2   CARDIAC CATHETERIZATION     CERVICAL DISCECTOMY     x 2   COLONOSCOPY     COLONOSCOPY  N/A 07/25/2015   Procedure: COLONOSCOPY;  Surgeon: Christena Deem, MD;  Location: [facility] ENDOSCOPY;  Service: Endoscopy;  Laterality: N/A;   COLONOSCOPY WITH PROPOFOL N/A 10/04/2017   Procedure: COLONOSCOPY WITH PROPOFOL;  Surgeon: Christena Deem, MD;  Location: [facility] ENDOSCOPY;  Service: Endoscopy;  Laterality: N/A;   COLONOSCOPY WITH PROPOFOL N/A 07/10/2020   Procedure: COLONOSCOPY WITH PROPOFOL;  Surgeon: Toledo, Boykin Nearing, MD;  Location: [facility] ENDOSCOPY;  Service: Gastroenterology;  Laterality: N/A;   ELECTROMAGNETIC NAVIGATION BROCHOSCOPY Left 06/28/2016   Procedure: ELECTROMAGNETIC NAVIGATION BRONCHOSCOPY;  Surgeon: Stephanie Acre, MD;  Location: [facility] ORS;  Service: Cardiopulmonary;  Laterality: Left;   ENDOBRONCHIAL ULTRASOUND N/A 04/11/2018   Procedure: ENDOBRONCHIAL ULTRASOUND;  Surgeon: Erin Fulling, MD;  Location: [facility] ORS;  Service: Cardiopulmonary;  Laterality: N/A;   ESOPHAGOGASTRODUODENOSCOPY N/A 07/25/2015   Procedure: ESOPHAGOGASTRODUODENOSCOPY (EGD);  Surgeon: Christena Deem, MD;  Location: [facility] ENDOSCOPY;  Service: Endoscopy;  Laterality: N/A;   ESOPHAGOGASTRODUODENOSCOPY (EGD) WITH PROPOFOL N/A 07/10/2020   Procedure: ESOPHAGOGASTRODUODENOSCOPY (EGD) WITH PROPOFOL;  Surgeon: Toledo, Boykin Nearing, MD;  Location: [facility] ENDOSCOPY;  Service: Gastroenterology;  Laterality: N/A;   NASAL SINUS SURGERY     x 2    PORTA CATH INSERTION N/A 04/24/2018   Procedure: PORTA CATH INSERTION;  Surgeon: Annice Needy, MD;  Location: [facility] INVASIVE CV LAB;  Service: Cardiovascular;  Laterality: N/A;   rotator cuff surgery Right    07/2016   SEPTOPLASTY     SKIN GRAFT       Home Medications   Prior to Admission medications   Medication Sig Start Date End Date Taking? Authorizing Provider  acetaminophen (TYLENOL) 500 MG tablet Take 500 mg by mouth daily as needed.   Yes [provider]  albuterol (PROVENTIL) (2.5 MG/3ML) 0.083% nebulizer solution TAKE 3 MLS (2.5 MG TOTAL) BY NEBULIZATION 4  (FOUR) TIMES DAILY AS NEEDED FOR WHEEZING OR SHORTNESS OF BREATH. 10/04/22 10/04/23 Yes Salena Saner, MD  albuterol (VENTOLIN HFA) 108 (90 Base) MCG/ACT inhaler INHALE 2 PUFFS BY MOUTH EVERY 4 HOURS AS NEEDED FOR WHEEZE OR FOR SHORTNESS OF BREATH 01/17/23  Yes Salena Saner, MD  atorvastatin (LIPITOR) 10 MG tablet Take 10 mg by mouth daily. 08/17/22  Yes [provider]  azithromycin (ZITHROMAX) 500 MG tablet Take 500 mg by mouth daily. 01/21/23  Yes [provider]  Budeson-Glycopyrrol-Formoterol (BREZTRI AEROSPHERE) 160-9-4.8 MCG/ACT AERO Inhale 2 puffs into the lungs in the morning and at bedtime. 06/29/22  Yes Salena Saner, MD  diazepam (VALIUM) 5 MG tablet Take 5 mg by mouth at bedtime as needed. 11/30/21  Yes [provider]  dronabinol (MARINOL) 5 MG capsule Take 1 capsule (5 mg total) by mouth 2 (two) times daily before lunch and supper. 11/23/22  Yes Jeralyn Ruths, MD  ethambutol (MYAMBUTOL) 400 MG tablet Take 3 tablets (1,200 mg total) by mouth daily. 10/19/22  Yes Lynn Itoavishankar, Jayashree, MD  ferrous sulfate 325 (65 FE) MG tablet Take 325 mg by mouth at bedtime.   Yes [provider]  gabapentin (NEURONTIN) 100 MG capsule Take 1 capsule (100 mg total) by mouth at bedtime. 01/20/23  Yes Borders, Daryl EasternJoshua R, NP  levothyroxine (SYNTHROID) 200 MCG tablet Take 200 mcg by mouth daily before breakfast. 09/10/22 09/10/23 Yes [provider]  magnesium oxide (MAG-OX) 400 MG tablet Take 600 mg by mouth 2 (two) times daily. Takes 1.5 tablet twice daily   Yes [provider]  nitroGLYCERIN (NITROSTAT) 0.4 MG SL tablet Place under the tongue. 04/07/21  Yes [provider]  OXYGEN Inhale 2 L into the lungs at bedtime.   Yes [provider]  pantoprazole (PROTONIX) 40 MG tablet Take 1 tablet (40 mg total) by mouth 2 (two) times daily before meals 02/15/22  Yes [provider]  rifampin (RIFADIN) 300 MG capsule Take 2  capsules (600 mg total) by mouth daily. 10/19/22  Yes Lynn Itoavishankar, Jayashree, MD  sodium chloride 1 g tablet Take 1 tablet (1 g total) by mouth 3 (three) times daily with meals. 09/22/21  Yes Hollice EspyKrishnan, Sendil K, MD  tadalafil (CIALIS) 5 MG tablet Take 1 tablet (5 mg total) by mouth daily as needed for erectile dysfunction. 12/30/22  Yes McGowan, Carollee HerterShannon A, PA-C  ipratropium-albuterol (DUONEB) 0.5-2.5 (3) MG/3ML SOLN Take 3 mLs by nebulization every 6 (six) hours as needed. 01/22/22 02/09/22  Borders, Daryl EasternJoshua R, NP  levalbuterol (XOPENEX HFA) 45 MCG/ACT inhaler Inhale 1 puff into the lungs every 6 (six) hours as needed for wheezing. 11/19/21 02/09/22  Erin FullingKasa, Kurian, MD     Allergies  Lisinopril and Varenicline   Family History   Family History  Problem Relation Age of Onset   Heart disease Father    Prostate cancer Father    Heart disease Paternal Grandmother    Heart attack Maternal Grandfather [id number]   Kidney cancer Neg Hx    Bladder Cancer Neg Hx    Other Neg Hx        pituitary abnormality     Physical Exam  Triage Vital Signs: ED Triage Vitals [02/09/23 2122]  Enc Vitals Group     BP 128/74     Pulse Rate 84     Resp 19     Temp 98.3 F (36.8 C)     Temp Source Oral     SpO2 97 %     Weight      Height      Head Circumference      Peak Flow      Pain Score 5     Pain Loc      Pain Edu?      Excl. in GC?     Updated Vital Signs: BP 128/74 (BP Location: Left Arm)   Pulse 84   Temp 98.3 F (36.8 C) (Oral)   Resp 19   SpO2 97%    General: Awake, no distress.  CV:  RRR.  Good peripheral perfusion.  Resp:  Normal effort.  CTAB. Abd:  Nontender.  No distention.  Other:  Bilateral calves are supple and nontender.   ED Results / Procedures / Treatments  Labs (all labs ordered are listed, but only abnormal results are displayed) Labs Reviewed  CBC WITH DIFFERENTIAL/PLATELET - Abnormal; Notable for the following components:      Result  Value   RBC 3.85 (*)     Hemoglobin 12.4 (*)    HCT 35.6 (*)    Lymphs Abs 0.5 (*)    All other components within normal limits  COMPREHENSIVE METABOLIC PANEL - Abnormal; Notable for the following components:   Sodium 127 (*)    Chloride 94 (*)    Calcium 8.7 (*)    Total Protein 6.4 (*)    All other components within normal limits  URINALYSIS, ROUTINE W REFLEX MICROSCOPIC - Abnormal; Notable for the following components:   Color, Urine YELLOW (*)    APPearance CLEAR (*)    All other components within normal limits  MAGNESIUM  TROPONIN I (HIGH SENSITIVITY)     EKG  ED ECG REPORT I, Tacey Dimaggio J, the attending physician, personally viewed and interpreted this ECG.   Date: 02/09/2023  EKG Time: 2114  Rate: 82  Rhythm: normal sinus rhythm  Axis: Normal  Intervals:none  ST&T Change: Nonspecific    RADIOLOGY I have independently visualized and interpreted patient's x-ray as well as noted the radiology interpretation:  X-ray: Stable cavitary lesion  Official radiology report(s): DG Chest 1 View  Result Date: 02/09/2023 CLINICAL DATA:  Chest pain and weakness EXAM: PORTABLE CHEST 1 VIEW COMPARISON:  08/06/2022, CT from 02/02/2023 FINDINGS: Cardiac shadow is within normal limits. Aortic calcifications are noted. Right chest wall port is seen. Persistent apical soft tissue density is noted on the right similar to that seen on the prior CT related to the thick walled cavitary lesion. No new focal infiltrate or effusion is seen. The scattered nodules seen in both lungs are visualized but to a lesser degree than that on prior plain film examination. Postsurgical changes in the cervical spine are seen. No acute bony abnormality is noted. IMPRESSION: Stable cavitary lesion in the right apex. Scattered bilateral nodules are again seen. Electronically Signed   By: Alcide Clever M.D.   On: 02/09/2023 21:57     PROCEDURES:  Critical Care performed: No  .1-3 Lead EKG Interpretation  Performed by: Irean Hong,  MD Authorized by: Irean Hong, MD     Interpretation: normal     ECG rate:  80   ECG rate assessment: normal     Rhythm: sinus rhythm     Ectopy: none     Conduction: normal   Comments:     Patient placed on cardiac monitor to evaluate for arrhythmias    MEDICATIONS ORDERED IN ED: Medications - No data to display   IMPRESSION / MDM / ASSESSMENT AND PLAN / ED COURSE  I reviewed the triage vital signs and the nursing notes.                             67 year old male presenting with generalized weakness and atypical chest pain. Differential diagnosis includes, but is not limited to, ACS, aortic dissection, pulmonary embolism, cardiac tamponade, pneumothorax, pneumonia, pericarditis, myocarditis, GI-related causes including esophagitis/gastritis, and musculoskeletal chest wall pain.   I have personally reviewed patient's records and note his oncology and pulmonology office visits from yesterday.  Patient's presentation is most consistent with acute presentation with potential threat to life or bodily function.  The patient is on the cardiac monitor to evaluate for evidence of arrhythmia and/or significant heart rate changes.  Laboratory results demonstrate normal hemoglobin 12.4, hyponatremia with sodium 127, unremarkable magnesium, troponin and urine.  Chest x-ray stable.  Patient is reassured hearing that his  sodium is increased from yesterday and is eager for discharge home.  Do not feel repeat troponin is warranted as patient has had the symptoms previously.  Has an upcoming appointment with his nephrologist next week for recheck.  Strict return precautions given.  Patient and spouse verbalized understanding and agree with plan of care.      FINAL CLINICAL IMPRESSION(S) / ED DIAGNOSES   Final diagnoses:  Generalized weakness  Atypical chest pain  Hyponatremia     Rx / DC Orders   ED Discharge Orders     None        Note:  This document was prepared using Dragon  voice recognition software and may include unintentional dictation errors.   Irean Hong, MD 02/10/23 [phone]

## 2023-02-10 ENCOUNTER — Encounter: Payer: Self-pay | Admitting: Pulmonary Disease

## 2023-02-21 ENCOUNTER — Other Ambulatory Visit: Payer: Self-pay | Admitting: Infectious Diseases

## 2023-02-21 MED ORDER — RIFAMPIN 300 MG PO CAPS: 600.0000 mg | ORAL_CAPSULE | Freq: Every day | ORAL | 6 refills | Status: DC

## 2023-02-21 MED ORDER — AZITHROMYCIN 500 MG PO TABS: 500.0000 mg | ORAL_TABLET | Freq: Every day | ORAL | 6 refills | Status: DC

## 2023-02-21 MED ORDER — ETHAMBUTOL HCL 400 MG PO TABS: 1200.0000 mg | ORAL_TABLET | Freq: Every day | ORAL | 6 refills | Status: DC

## 2023-02-22 ENCOUNTER — Other Ambulatory Visit
Admission: RE | Admit: 2023-02-22 | Discharge: 2023-02-22 | Disposition: A | Discharge status: Home / Self Care | Payer: Medicare Other | Source: Ambulatory Visit | Attending: Nephrology | Admitting: Nephrology

## 2023-02-22 DIAGNOSIS — E871 Hypo-osmolality and hyponatremia: Secondary | ICD-10-CM | POA: Insufficient documentation

## 2023-02-22 LAB — BASIC METABOLIC PANEL
Anion gap: 6 (ref 5–15)
BUN: 10 mg/dL (ref 8–23)
CO2: 28 mmol/L (ref 22–32)
Calcium: 8.7 mg/dL — ABNORMAL LOW (ref 8.9–10.3)
Chloride: 95 mmol/L — ABNORMAL LOW (ref 98–111)
Creatinine, Ser: 0.57 mg/dL — ABNORMAL LOW (ref 0.61–1.24)
GFR, Estimated: >60 mL/min (ref >60)
Glucose, Bld: 118 mg/dL — ABNORMAL HIGH (ref 70–99)
Potassium: 4.5 mmol/L (ref 3.5–5.1)
Sodium: 129 mmol/L — ABNORMAL LOW (ref 135–145)

## 2023-03-10 ENCOUNTER — Ambulatory Visit: Payer: Medicare Other | Attending: Infectious Diseases | Admitting: Infectious Diseases

## 2023-03-10 ENCOUNTER — Encounter: Payer: Self-pay | Admitting: Infectious Diseases

## 2023-03-10 VITALS — BP 145/84 | HR 86 | Temp 97.3°F | Ht 72.0 in | Wt 166.0 lb

## 2023-03-10 DIAGNOSIS — J449 Chronic obstructive pulmonary disease, unspecified: Secondary | ICD-10-CM | POA: Insufficient documentation

## 2023-03-10 DIAGNOSIS — C3491 Malignant neoplasm of unspecified part of right bronchus or lung: Secondary | ICD-10-CM | POA: Diagnosis present

## 2023-03-10 DIAGNOSIS — F17210 Nicotine dependence, cigarettes, uncomplicated: Secondary | ICD-10-CM | POA: Insufficient documentation

## 2023-03-10 DIAGNOSIS — Z79899 Other long term (current) drug therapy: Secondary | ICD-10-CM | POA: Diagnosis not present

## 2023-03-10 DIAGNOSIS — E271 Primary adrenocortical insufficiency: Secondary | ICD-10-CM | POA: Insufficient documentation

## 2023-03-10 DIAGNOSIS — E039 Hypothyroidism, unspecified: Secondary | ICD-10-CM | POA: Diagnosis not present

## 2023-03-10 DIAGNOSIS — E222 Syndrome of inappropriate secretion of antidiuretic hormone: Secondary | ICD-10-CM | POA: Insufficient documentation

## 2023-03-10 DIAGNOSIS — Z923 Personal history of irradiation: Secondary | ICD-10-CM | POA: Diagnosis not present

## 2023-03-10 DIAGNOSIS — Z9221 Personal history of antineoplastic chemotherapy: Secondary | ICD-10-CM | POA: Diagnosis not present

## 2023-03-10 DIAGNOSIS — E785 Hyperlipidemia, unspecified: Secondary | ICD-10-CM | POA: Diagnosis not present

## 2023-03-10 DIAGNOSIS — Z79890 Hormone replacement therapy (postmenopausal): Secondary | ICD-10-CM | POA: Insufficient documentation

## 2023-03-10 DIAGNOSIS — Z85118 Personal history of other malignant neoplasm of bronchus and lung: Secondary | ICD-10-CM | POA: Diagnosis not present

## 2023-03-10 DIAGNOSIS — A310 Pulmonary mycobacterial infection: Secondary | ICD-10-CM | POA: Diagnosis not present

## 2023-03-10 DIAGNOSIS — Z8249 Family history of ischemic heart disease and other diseases of the circulatory system: Secondary | ICD-10-CM | POA: Insufficient documentation

## 2023-03-10 NOTE — Progress Notes (Signed)
NAME: Paul Carpenter  DOB: [birth year]  MRN: [id number]  Date/Time: 03/10/2023 10:14 AM  Subjective:  Paul Carpenter is a 67 y.o. 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 On Ethambutol and azithromycin and rifampin  100% adherent  He has been taking ethambutol, and Azithromycin since June 2023 and added  rifampin since September 2023 100% adherent 3 sputum for Afb 10/06/22, 10/07/22 and 10/08/22 are neg  Was in ED on 02/09/23 for weakness and hyponatremia Na 127 was DC Saw both pulmonologist Dr.[name] and Oncologist on 4/9 and CT chest done was Both deemed the CT scan was stable and also no malignancy currently and to continue MAC treatment Dr.[name] on 02/08/23- a recent CT scan  Doing okay Says the anti MAC treatment is making him sick - but able to take it every day Recently started on hydrocortisone for bordeline low cortisol and with normal ACTH   Smoking 2 packs/day- cut down from 3  He has not had any alcohol for the past month  Recent labs normal LFTS/cr  Following taken from last note  Patient has a complicated lung history As per Dr. Milinda Cave note he was initially diagnosed with non-small cell carcinoma of the lung in 2019. On 04/11/2018 a bronch with EBUS done and biopsy taken of subcarinal mass- it showed  - POSITIVE FOR MALIGNANT CELLS IN A NECROTIC BACKGROUND.  MRI of the brain completed on Mar 28, 2018 did not reveal any metastatic disease.  He completed radiation June 26, 2018.  He completed concurrent single agent carboplatinum on June 21, 2018.  He had a reaction to Taxol during cycle 1 and this was discontinued.  He completed a year-long maintenance subdural level lab on June 27, 2019.  In July 2022 there  was a concern for slight progression of the disease.  PET scan done on 06/03/2021 revealed a spiculated cavitary mass in the apical segment of the right upper lobe with similar SUV which was 3.5 versus 3.31-year ago.  So this was reassuring as per the radiologist.  However disease progression could not be definitely excluded.   no metastasis seen.  A CT scan done on 07/20/2021 showed continued slow evolution of a thick-walled cavitary mass in the right upper lobe which remains concerning for slow-growing neoplasm.  He had a fall and fractured his C 2 in December , 2022.  He was in [facility] at that time.  CTs chest that was done that showed a large right upper lobe soft tissue mass with similar abnormal tissue involving the right pulmonary hilum extending medially into the ipsilateral mediastinum in keeping with a reported history of primary lung cancer. In January he was hospitalized at [facility] for hyponatremia.  At that time oncologist saw him and got another PET scan.  A bronchoscopy/biopsy could not be done due to C2 fracture.  A PET scan was done on 11/16/2021 and it showed similar size of the cavitary process in the right upper lobe.  Increased size and hypermetabolism compared to the PET on 06/02/2021.  Hypermetabolic pulmonary nodules many of which were new compared to the CAT scan of 10/20/2021.    He was diagnosed to have recurrence and was started on palliative chemo with  taxotere( 4 cycles) on 11/26/21- completed on 01/28/22 He was hospitalized  02/03/22-02/05/22 for  sob and cough and fever and sputum sent which was pseudomonas, but realized after his discharge He saw Dr.[name] on 02/09/22 and received a 2 week course of cipro for necrotizing pneumonia with pseudomonas. A repeat PET scan done on 02/23/22 showed Thick-walled cavitary mass in the right upper lobe has decreased in overall hypermetabolism, indicative of treatment response. 2. Multifocal areas of nodular consolidation and peribronchovascular nodularity  with a waxing and waning appearance, suggesting combination slight improvement in metastatic disease with a superimposed atypical/fungal infectious process. 3. Mildly hypermetabolic low right paratracheal and right hilar lymph nodes, metastatic or reactive. 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 and he was referred to me for treatment He has been taking ethambutol, and Azithromycin since June 2023 and added  rifampin since September 2023 100% adherent 3 sputum for Afb 12/6, 12/7 and 12/8 are neg Started on  Followed by Dr.[name] and Dr.[name]  Past Medical History:  Diagnosis Date   Anginal pain (HCC)    Anxiety    Asthma    Chest pain    CHF (congestive heart failure) (HCC)    Chicken pox    Complication of anesthesia    o2 dropped after neck fusion   COPD (chronic obstructive pulmonary disease) (HCC)    Coronary artery disease    Cough    chronic  clear phlegm   Dysrhythmia    palpitations   GERD (gastroesophageal reflux disease)    h/o reflux/ hoarsness   Hematochezia    Hemorrhoids    History of chickenpox    History of colon polyps    History of Helicobacter pylori infection    Hoarseness    Hypertension    Lung cancer (HCC) 05/2016   Chemo + rad tx's.    Migraines    OSA (obstructive sleep apnea)    has CPAP but does not use   Personal history of tobacco use, presenting hazards to health 03/05/2016   Pneumonia    5/17   Raynaud disease    Raynaud disease    Raynaud's disease    Rotator cuff tear    on right   Shortness of breath dyspnea    Sleep apnea    Ulcer (traumatic) of oral mucosa     Past Surgical History:  Procedure Laterality Date   BACK SURGERY     cervical fusion x 2   CARDIAC CATHETERIZATION     CERVICAL DISCECTOMY     x 2   COLONOSCOPY     COLONOSCOPY N/A 07/25/2015   Procedure: COLONOSCOPY;  Surgeon: Christena Deem, MD;  Location: [facility] ENDOSCOPY;  Service: Endoscopy;  Laterality: N/A;   COLONOSCOPY WITH PROPOFOL N/A  10/04/2017   Procedure: COLONOSCOPY WITH PROPOFOL;  Surgeon: Christena Deem, MD;  Location: [facility] ENDOSCOPY;  Service: Endoscopy;  Laterality: N/A;   COLONOSCOPY WITH PROPOFOL N/A 07/10/2020   Procedure: COLONOSCOPY WITH PROPOFOL;  Surgeon: Toledo, Boykin Nearing, MD;  Location: [facility] ENDOSCOPY;  Service: Gastroenterology;  Laterality: N/A;   ELECTROMAGNETIC NAVIGATION BROCHOSCOPY Left 06/28/2016   Procedure: ELECTROMAGNETIC NAVIGATION BRONCHOSCOPY;  Surgeon: Stephanie Acre, MD;  Location: [facility] ORS;  Service: Cardiopulmonary;  Laterality: Left;   ENDOBRONCHIAL ULTRASOUND N/A 04/11/2018   Procedure: ENDOBRONCHIAL ULTRASOUND;  Surgeon: Erin Fulling, MD;  Location: [facility] ORS;  Service: Cardiopulmonary;  Laterality: N/A;   ESOPHAGOGASTRODUODENOSCOPY N/A 07/25/2015   Procedure: ESOPHAGOGASTRODUODENOSCOPY (EGD);  Surgeon: Christena Deem, MD;  Location: [facility] ENDOSCOPY;  Service: Endoscopy;  Laterality: N/A;   ESOPHAGOGASTRODUODENOSCOPY (EGD) WITH PROPOFOL N/A 07/10/2020  Procedure: ESOPHAGOGASTRODUODENOSCOPY (EGD) WITH PROPOFOL;  Surgeon: Toledo, Boykin Nearing, MD;  Location: [facility] ENDOSCOPY;  Service: Gastroenterology;  Laterality: N/A;   NASAL SINUS SURGERY     x 2    PORTA CATH INSERTION N/A 04/24/2018   Procedure: PORTA CATH INSERTION;  Surgeon: Annice Needy, MD;  Location: [facility] INVASIVE CV LAB;  Service: Cardiovascular;  Laterality: N/A;   rotator cuff surgery Right    07/2016   SEPTOPLASTY     SKIN GRAFT      Social History   Socioeconomic History   Marital status: Married    Spouse name: Not on file   Number of children: 3   Years of education: Not on file   Highest education level: Not on file  Occupational History   Occupation: Holiday representative Work  Tobacco Use   Smoking status: Every Day    Packs/day: 3.00    Years: 50.00    Additional pack years: 0.00    Total pack years: 150.00    Types: Cigarettes    Passive exposure: Current   Smokeless tobacco: Never   Tobacco comments:    2 PPD  02/08/2023  Vaping Use   Vaping Use: Never used  Substance and Sexual Activity   Alcohol use: Yes    Alcohol/week: 2.0 standard drinks of alcohol    Types: 2 Standard drinks or equivalent per week    Comment: moderate   Drug use: No   Sexual activity: Not on file  Other Topics Concern   Not on file  Social History Narrative   Not on file   Social Determinants of Health   Financial Resource Strain: Low Risk  (07/22/2022)   Overall Financial Resource Strain (CARDIA)    Difficulty of Paying Living Expenses: Not very hard  Food Insecurity: No Food Insecurity (09/11/2022)   Hunger Vital Sign    Worried About Running Out of Food in the Last Year: Never true    Ran Out of Food in the Last Year: Never true  Transportation Needs: No Transportation Needs (09/11/2022)   PRAPARE - Administrator, Civil Service (Medical): No    Lack of Transportation (Non-Medical): No  Physical Activity: Inactive (07/22/2022)   Exercise Vital Sign    Days of Exercise per Week: 0 days    Minutes of Exercise per Session: 0 min  Stress: Stress Concern Present (07/22/2022)   [employer] of Occupational Health - Occupational Stress Questionnaire    Feeling of Stress : Rather much  Social Connections: Moderately Integrated (07/22/2022)   Social Connection and Isolation Panel [NHANES]    Frequency of Communication with Friends and Family: Twice a week    Frequency of Social Gatherings with Friends and Family: Twice a week    Attends Religious Services: 1 to 4 times per year    Active Member of [employer] or Organizations: No    Attends Engineer, structural: Not on file    Marital Status: Married  Catering manager Violence: Not At Risk (09/11/2022)   Humiliation, Afraid, Rape, and Kick questionnaire    Fear of Current or Ex-Partner: No    Emotionally Abused: No    Physically Abused: No    Sexually Abused: No    Family History  Problem Relation Age of Onset   Heart disease Father     Prostate cancer Father    Heart disease Paternal Grandmother    Heart attack Maternal Grandfather 95   Kidney cancer Neg Hx    Bladder Cancer  Neg Hx    Other Neg Hx        pituitary abnormality   Allergies  Allergen Reactions   Lisinopril Rash   Varenicline Rash   I? Current Outpatient Medications  Medication Sig Dispense Refill   acetaminophen (TYLENOL) 500 MG tablet Take 500 mg by mouth daily as needed.     albuterol (PROVENTIL) (2.5 MG/3ML) 0.083% nebulizer solution TAKE 3 MLS (2.5 MG TOTAL) BY NEBULIZATION 4 (FOUR) TIMES DAILY AS NEEDED FOR WHEEZING OR SHORTNESS OF BREATH. 90 mL 2   albuterol (VENTOLIN HFA) 108 (90 Base) MCG/ACT inhaler INHALE 2 PUFFS BY MOUTH EVERY 4 HOURS AS NEEDED FOR WHEEZE OR FOR SHORTNESS OF BREATH 8.5 each 2   atorvastatin (LIPITOR) 10 MG tablet Take 10 mg by mouth daily.     azithromycin (ZITHROMAX) 500 MG tablet Take 1 tablet (500 mg total) by mouth daily. 30 tablet 6   Budeson-Glycopyrrol-Formoterol (BREZTRI AEROSPHERE) 160-9-4.8 MCG/ACT AERO Inhale 2 puffs into the lungs in the morning and at bedtime. 5.9 g 0   diazepam (VALIUM) 5 MG tablet Take 5 mg by mouth at bedtime as needed.     dronabinol (MARINOL) 5 MG capsule Take 1 capsule (5 mg total) by mouth 2 (two) times daily before lunch and supper. 60 capsule 0   ethambutol (MYAMBUTOL) 400 MG tablet Take 3 tablets (1,200 mg total) by mouth daily. 90 tablet 6   ferrous sulfate 325 (65 FE) MG tablet Take 325 mg by mouth at bedtime.     gabapentin (NEURONTIN) 100 MG capsule Take 1 capsule (100 mg total) by mouth at bedtime. 30 capsule 3   levothyroxine (SYNTHROID) 200 MCG tablet Take 200 mcg by mouth daily before breakfast.     magnesium oxide (MAG-OX) 400 MG tablet Take 600 mg by mouth 2 (two) times daily. Takes 1.5 tablet twice daily     nitroGLYCERIN (NITROSTAT) 0.4 MG SL tablet Place under the tongue.     OXYGEN Inhale 2 L into the lungs at bedtime.     pantoprazole (PROTONIX) 40 MG tablet Take 1 tablet  (40 mg total) by mouth 2 (two) times daily before meals     rifampin (RIFADIN) 300 MG capsule Take 2 capsules (600 mg total) by mouth daily. 60 capsule 6   sodium chloride 1 g tablet Take 1 tablet (1 g total) by mouth 3 (three) times daily with meals. 90 tablet 1   tadalafil (CIALIS) 5 MG tablet Take 1 tablet (5 mg total) by mouth daily as needed for erectile dysfunction. 90 tablet 3   No current facility-administered medications for this visit.     Abtx:  Anti-infectives (From admission, onward)    None       REVIEW OF SYSTEMS:  Const: as above Eyes: negative diplopia or visual changes, negative eye pain ENT: negative coryza, negative sore throat Resp: cough, , dyspnea rt sided pleuritic chest pain better than before Cards: negative for chest pain, palpitations, lower extremity edema GU: negative for frequency, dysuria and hematuria GI: poor appetite , weight loss Skin: negative for rash and pruritus Heme: negative for easy bruising and gum/nose bleeding MS: fatigue Neurolo:negative for headaches, dizziness, vertigo, memory problems  Psych:depression  Endocrine: negative for thyroid, diabetes Allergy/Immunology-as above: Continues to smoke2 pack/day-  VITALS:  BP (!) 145/84   Pulse 86   Temp (!) 97.3 F (36.3 C) (Temporal)   Ht 6' (1.829 m)   Wt 166 lb (75.3 kg)   SpO2 98%   BMI 22.51  kg/m     PHYSICAL EXAM:  General: Alert, cooperative, no  distress at rest,  Throat spotty white spots Lungs: b/l air entry Rhonchi and crepts on rt side Heart: Regular rate and rhythm, no murmur, rub or gallop. Abdomen: did not examine Extremities: atraumatic, no cyanosis. No edema. No clubbing Skin: No rashes or lesions. Or bruising Lymph: Cervical, supraclavicular normal. Neurologic: Grossly non-focal  Labs    Latest Ref Rng & Units 02/09/2023    9:28 PM 02/08/2023    9:55 AM 11/09/2022   10:27 AM  CBC  WBC 4.0 - 10.5 K/uL 6.3  7.1  5.7   Hemoglobin 13.0 - 17.0 g/dL 16.1   09.6  04.5   Hematocrit 39.0 - 52.0 % 35.6  38.8  35.4   Platelets 150 - 400 K/uL 195  211  221        Latest Ref Rng & Units 02/22/2023    9:55 AM 02/09/2023    9:28 PM 02/08/2023    9:55 AM  CMP  Glucose 70 - 99 mg/dL 409  85  811   BUN 8 - 23 mg/dL 10  13  8    Creatinine 0.61 - 1.24 mg/dL 9.14  7.82  9.56   Sodium 135 - 145 mmol/L 129  127  123   Potassium 3.5 - 5.1 mmol/L 4.5  4.2  4.0   Chloride 98 - 111 mmol/L 95  94  92   CO2 22 - 32 mmol/L 28  27  22    Calcium 8.9 - 10.3 mg/dL 8.7  8.7  8.7   Total Protein 6.5 - 8.1 g/dL  6.4  6.8   Total Bilirubin 0.3 - 1.2 mg/dL  0.5  0.5   Alkaline Phos 38 - 126 U/L  74  83   AST 15 - 41 U/L  22  26   ALT 0 - 44 U/L  13  14     s  ? Impression/Recommendation 67 year-old male current smoker and alcohol use with  recurrent non-small cell CA lung on the right side.  In 2019 had undergone chemo, radiation and immunotherapy Since January 2023 he had taken 4 cycles of palliative chemotherapy. No chemo since the past 12  months No biopsy was taken the second time around because of C2 fracture  Neck cavitary/necrotizing mass on the right upper lobe/middle lobe Being treated for MAC - on azithromycin, ethambutol  since June 2023 and rifampin since sept 2023  Three sputums for afb in Dec 2023 was nega for MAC He will need minimum 2 years of Rx  Will repeat  sputum for afb- every 5-6 months Continue triple therapy   Hyperlipidemia on atorvastatin. With rifampin the level can decrease or increase- he has no stents-   COPD  Hypothyroidism on Synthroid- recently increased   Hyponatremia due to SIADH- recently admitted  for that. On sodium tablets+ magnesium  Adrenal insufficiency-Addison's disease- on hydrocortisone- managed by Dr.Solum  Poor appetite- on marinol with not much help- also expensive prescription   Discussed with patient and his wife in detail Follow up 6 months  __________________________________________________

## 2023-03-14 ENCOUNTER — Other Ambulatory Visit
Admission: RE | Admit: 2023-03-14 | Discharge: 2023-03-14 | Disposition: A | Discharge status: Home / Self Care | Payer: Medicare Other | Source: Ambulatory Visit | Attending: Infectious Diseases | Admitting: Infectious Diseases

## 2023-03-14 DIAGNOSIS — A310 Pulmonary mycobacterial infection: Secondary | ICD-10-CM | POA: Insufficient documentation

## 2023-03-15 ENCOUNTER — Other Ambulatory Visit: Payer: Self-pay | Admitting: Pulmonary Disease

## 2023-03-15 ENCOUNTER — Inpatient Hospital Stay: Payer: Medicare Other

## 2023-03-15 ENCOUNTER — Other Ambulatory Visit
Admission: RE | Admit: 2023-03-15 | Discharge: 2023-03-15 | Disposition: A | Discharge status: Home / Self Care | Payer: Medicare Other | Source: Ambulatory Visit | Attending: Infectious Diseases | Admitting: Infectious Diseases

## 2023-03-15 DIAGNOSIS — A310 Pulmonary mycobacterial infection: Secondary | ICD-10-CM | POA: Diagnosis present

## 2023-03-16 ENCOUNTER — Other Ambulatory Visit
Admission: RE | Admit: 2023-03-16 | Discharge: 2023-03-16 | Disposition: A | Discharge status: Home / Self Care | Payer: Medicare Other | Source: Ambulatory Visit | Attending: Infectious Diseases | Admitting: Infectious Diseases

## 2023-03-16 DIAGNOSIS — A310 Pulmonary mycobacterial infection: Secondary | ICD-10-CM | POA: Diagnosis present

## 2023-03-18 LAB — ACID FAST SMEAR (AFB, MYCOBACTERIA): Acid Fast Smear: NEGATIVE

## 2023-03-21 LAB — ACID FAST SMEAR (AFB, MYCOBACTERIA): Acid Fast Smear: NEGATIVE

## 2023-03-22 ENCOUNTER — Inpatient Hospital Stay: Payer: Medicare Other | Attending: Oncology

## 2023-03-22 DIAGNOSIS — Z452 Encounter for adjustment and management of vascular access device: Secondary | ICD-10-CM | POA: Diagnosis present

## 2023-03-22 DIAGNOSIS — C342 Malignant neoplasm of middle lobe, bronchus or lung: Secondary | ICD-10-CM | POA: Insufficient documentation

## 2023-03-22 DIAGNOSIS — C3491 Malignant neoplasm of unspecified part of right bronchus or lung: Secondary | ICD-10-CM

## 2023-03-22 DIAGNOSIS — F17210 Nicotine dependence, cigarettes, uncomplicated: Secondary | ICD-10-CM | POA: Insufficient documentation

## 2023-03-22 DIAGNOSIS — Z95828 Presence of other vascular implants and grafts: Secondary | ICD-10-CM

## 2023-03-22 LAB — CMP (CANCER CENTER ONLY)
ALT: 15 U/L (ref 0–44)
AST: 23 U/L (ref 15–41)
Albumin: 3.7 g/dL (ref 3.5–5.0)
Alkaline Phosphatase: 70 U/L (ref 38–126)
Anion gap: 7 (ref 5–15)
BUN: 8 mg/dL (ref 8–23)
CO2: 26 mmol/L (ref 22–32)
Calcium: 8.4 mg/dL — ABNORMAL LOW (ref 8.9–10.3)
Chloride: 95 mmol/L — ABNORMAL LOW (ref 98–111)
Creatinine: 0.55 mg/dL — ABNORMAL LOW (ref 0.61–1.24)
GFR, Estimated: >60 mL/min (ref >60)
Glucose, Bld: 114 mg/dL — ABNORMAL HIGH (ref 70–99)
Potassium: 4.2 mmol/L (ref 3.5–5.1)
Sodium: 128 mmol/L — ABNORMAL LOW (ref 135–145)
Total Bilirubin: 0.6 mg/dL (ref 0.3–1.2)
Total Protein: 6.2 g/dL — ABNORMAL LOW (ref 6.5–8.1)

## 2023-03-22 LAB — CBC WITH DIFFERENTIAL (CANCER CENTER ONLY)
Abs Immature Granulocytes: 0.01 10*3/uL (ref 0.00–0.07)
Basophils Absolute: 0 10*3/uL (ref 0.0–0.1)
Basophils Relative: 1 %
Eosinophils Absolute: 0 10*3/uL (ref 0.0–0.5)
Eosinophils Relative: 0 %
HCT: 35.9 % — ABNORMAL LOW (ref 39.0–52.0)
Hemoglobin: 12.5 g/dL — ABNORMAL LOW (ref 13.0–17.0)
Immature Granulocytes: 0 %
Lymphocytes Relative: 5 %
Lymphs Abs: 0.3 10*3/uL — ABNORMAL LOW (ref 0.7–4.0)
MCH: 32.6 pg (ref 26.0–34.0)
MCHC: 34.8 g/dL (ref 30.0–36.0)
MCV: 93.5 fL (ref 80.0–100.0)
Monocytes Absolute: 0.4 10*3/uL (ref 0.1–1.0)
Monocytes Relative: 6 %
Neutro Abs: 5.9 10*3/uL (ref 1.7–7.7)
Neutrophils Relative %: 88 %
Platelet Count: 178 10*3/uL (ref 150–400)
RBC: 3.84 MIL/uL — ABNORMAL LOW (ref 4.22–5.81)
RDW: 12.8 % (ref 11.5–15.5)
WBC Count: 6.7 10*3/uL (ref 4.0–10.5)
nRBC: 0 % (ref 0.0–0.2)

## 2023-03-22 MED ORDER — SODIUM CHLORIDE 0.9% FLUSH
10.0000 mL | Freq: Once | INTRAVENOUS | Status: AC
  Administered 2023-03-22: 10 mL via INTRAVENOUS
  Filled 2023-03-22: qty 10

## 2023-03-22 MED ORDER — HEPARIN SOD (PORK) LOCK FLUSH 100 UNIT/ML IV SOLN
500.0000 [IU] | Freq: Once | INTRAVENOUS | Status: AC
  Administered 2023-03-22: 500 [IU] via INTRAVENOUS
  Filled 2023-03-22: qty 5

## 2023-03-25 LAB — ACID FAST CULTURE WITH REFLEXED SENSITIVITIES (MYCOBACTERIA): Acid Fast Culture: POSITIVE — AB

## 2023-03-25 LAB — ACID FAST ID BY PCR AND SUSCEPTIBILITIES

## 2023-03-28 LAB — ACID FAST CULTURE WITH REFLEXED SENSITIVITIES (MYCOBACTERIA)

## 2023-03-29 LAB — ACID FAST ID BY PCR AND SUSCEPTIBILITIES: M avium complex: POSITIVE — AB

## 2023-03-29 LAB — ACID FAST CULTURE WITH REFLEXED SENSITIVITIES (MYCOBACTERIA): Acid Fast Culture: POSITIVE — AB

## 2023-03-30 ENCOUNTER — Telehealth: Payer: Self-pay

## 2023-03-30 NOTE — Telephone Encounter (Signed)
-----   Message from Lynn Ito, MD sent at 03/29/2023  1:43 PM EDT ----- Please let him know that the sputum culture is positive for mycobacterium avium after being neg last time- Looks like he is not taking them as he is supposed to be..Can you reiterate adherence to all three( azithromycin, ethambutol and rifampin) thx ----- Message ----- From: Interface, Lab In [place] Sent: 03/28/2023   9:35 AM EDT To: Lynn Ito, MD

## 2023-03-30 NOTE — Telephone Encounter (Signed)
Patient called stating that he seen sputum results and wanted to know what was going on. Patient states that he is having a hard time tolerating abx's as they are making him feel unwell. Advised I would send a message back to Dr.Ravishankar.   Paul Carpenter, CMA

## 2023-03-30 NOTE — Telephone Encounter (Signed)
I spoke to patient's wife about patient's sputum culture results. Per patient's wife the patient has been taking all 3 antibiotics daily. She said the patient has some concerns that the sputum culture is positive. Patient will do a video visit with Dr. Dayle Points tomorrow. Kerrianne Jeng T Pricilla Loveless

## 2023-03-31 ENCOUNTER — Telehealth: Payer: Medicare Other | Admitting: Infectious Diseases

## 2023-03-31 LAB — ACID FAST CULTURE WITH REFLEXED SENSITIVITIES (MYCOBACTERIA): Acid Fast Culture: POSITIVE — AB

## 2023-03-31 LAB — ACID FAST ID BY PCR AND SUSCEPTIBILITIES
M Tuberculosis Complex: NEGATIVE
M Tuberculosis Complex: NEGATIVE
M avium complex: POSITIVE — AB

## 2023-04-08 LAB — MAC SUSCEPTIBILITY BROTH
Ciprofloxacin: >8
Clarithromycin: 2
Doxycycline: >8
Linezolid: 32
Minocycline: >8
Moxifloxacin: >4
Rifabutin: 0.5
Rifampin: 4
Streptomycin: >32

## 2023-04-08 LAB — ACID FAST ID BY PCR AND SUSCEPTIBILITIES
M Tuberculosis Complex: NEGATIVE
M avium complex: POSITIVE — AB

## 2023-04-13 ENCOUNTER — Telehealth: Payer: Self-pay

## 2023-04-13 NOTE — Telephone Encounter (Signed)
Patient called office to follow up on lab results and referral to [facility]. States that he spoke with provider last week and she mentioned possible referral.  Would like call back after provider reviews results and discuss if he will need to go to [facility].  Juanita Laster, RMA

## 2023-04-14 ENCOUNTER — Other Ambulatory Visit: Payer: Self-pay | Admitting: Infectious Diseases

## 2023-04-14 ENCOUNTER — Ambulatory Visit: Payer: Medicare Other | Attending: Infectious Diseases | Admitting: Infectious Diseases

## 2023-04-14 DIAGNOSIS — Z9221 Personal history of antineoplastic chemotherapy: Secondary | ICD-10-CM | POA: Insufficient documentation

## 2023-04-14 DIAGNOSIS — F17210 Nicotine dependence, cigarettes, uncomplicated: Secondary | ICD-10-CM | POA: Diagnosis not present

## 2023-04-14 DIAGNOSIS — A310 Pulmonary mycobacterial infection: Secondary | ICD-10-CM | POA: Diagnosis not present

## 2023-04-14 DIAGNOSIS — Z85118 Personal history of other malignant neoplasm of bronchus and lung: Secondary | ICD-10-CM | POA: Diagnosis not present

## 2023-04-14 DIAGNOSIS — Z923 Personal history of irradiation: Secondary | ICD-10-CM | POA: Insufficient documentation

## 2023-04-14 NOTE — Progress Notes (Signed)
The purpose of this virtual visit is to provide medical care while limiting exposure to the novel coronavirus (COVID19) for both patient and office staff.   Consent was obtained for video visit:  Yes.   Answered questions that patient had about telehealth interaction:  Yes.   I discussed the limitations, risks, security and privacy concerns of performing an evaluation and management service by telephone. I also discussed with the patient that there may be a patient responsible charge related to this service. The patient expressed understanding and agreed to proceed.   Patient Location: outside Provider Location:office Patient and Dr Rivka Safer went on this call  This visit was to discuss his abnormal labs.  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  He is has a history of stage IIIa non-small cell CA lung status post radiation and chemo and maintenance immunotherapy but has not had any in the past 14 months.  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 Patient has been on MAI treatment since June 2023.  He initially started on ethambutol and azithromycin and then rifampin was added in September 2023. Patient initially used to drink alcohol but has stopped since starting antibiotic.  He continues to smoke cigarettes a pack or more a day. Patient had 3 sputum's for AFB checked on 03/14/2023, 03/15/2023 and 03/16/2023 and they all had come positive.  Prior to that in December 2023  he had 3 sputum's which were through 12/ 6, 12 /7 and 12/ 8 and those were negative.  The first positive sputum was in May 2023. The susceptibility pattern has not changed and the organism is still susceptible to macrolide, rifampin.  There is no susceptibility reported for ethambutol. Patient is concerned that his sputum's have turned positive He also complains of excess  sputum production which is stringy. He does not have any fever He has a burning sensation in the right side of his chest On examination Patient looked well he was in no distress He was not having any shortness of breath  Impression/recommendation Mycobacterium AVM infection of the right lung cavity.  Patient was continuing azithromycin, ethambutol and rifampin He had a breakthrough positive sputum from May 2024 but there is no resistant to any of the antibiotics. I informed the patient that I will discuss with [facility] pulmonologist Dr. Garner Nash whose expertise is on MAI management. I also discussed with Dr. Jayme Cloud his current pulmonologist and she is in agreement Patient question about the role of surgery.  Informed the patient that will also do a sputum culture to rule out any associated bacterial infection Patient will come to collect the sputum cups to submit the specimen  After the visit I spoke to Dr. Garner Nash at [facility] by phone. We discussed different management options  possibly add IV amikacin to the current regimen Patient will be seen in [facility] clinic.  They will call him with the appointment. Referral and other information faxed to Dr. Garner Nash Total time spent on this video call with the patient and following that coordinating care with different providers was 30 minutes

## 2023-04-16 ENCOUNTER — Encounter: Payer: Self-pay | Admitting: Pulmonary Disease

## 2023-04-20 ENCOUNTER — Telehealth: Payer: Self-pay

## 2023-04-20 ENCOUNTER — Encounter: Payer: Self-pay | Admitting: Infectious Diseases

## 2023-04-20 DIAGNOSIS — J189 Pneumonia, unspecified organism: Secondary | ICD-10-CM

## 2023-04-20 NOTE — Telephone Encounter (Signed)
Patient called stating he tried to come by office yesterday to pick up sputum cups, but office was closed. Went to lab and was provided sterile cups for sputum collection. Is missing lab orders.  Patient will proceed with sputum collection. Will try to drop three samples off at lab before his appointment.  Will enter orders. Juanita Laster, RMA

## 2023-04-21 ENCOUNTER — Other Ambulatory Visit
Admission: RE | Admit: 2023-04-21 | Discharge: 2023-04-21 | Disposition: A | Discharge status: Home / Self Care | Payer: Medicare Other | Source: Ambulatory Visit | Attending: Infectious Diseases | Admitting: Infectious Diseases

## 2023-04-21 DIAGNOSIS — J189 Pneumonia, unspecified organism: Secondary | ICD-10-CM | POA: Diagnosis present

## 2023-04-21 DIAGNOSIS — J984 Other disorders of lung: Secondary | ICD-10-CM | POA: Insufficient documentation

## 2023-04-21 LAB — EXPECTORATED SPUTUM ASSESSMENT W GRAM STAIN, RFLX TO RESP C

## 2023-04-22 ENCOUNTER — Other Ambulatory Visit
Admission: RE | Admit: 2023-04-22 | Discharge: 2023-04-22 | Disposition: A | Discharge status: Home / Self Care | Payer: Medicare Other | Source: Ambulatory Visit | Attending: Infectious Diseases | Admitting: Infectious Diseases

## 2023-04-22 DIAGNOSIS — J189 Pneumonia, unspecified organism: Secondary | ICD-10-CM | POA: Insufficient documentation

## 2023-04-22 DIAGNOSIS — J984 Other disorders of lung: Secondary | ICD-10-CM | POA: Insufficient documentation

## 2023-04-22 LAB — EXPECTORATED SPUTUM ASSESSMENT W GRAM STAIN, RFLX TO RESP C

## 2023-04-23 LAB — CULTURE, RESPIRATORY W GRAM STAIN

## 2023-04-24 LAB — CULTURE, RESPIRATORY W GRAM STAIN: Culture: NORMAL

## 2023-04-25 LAB — CULTURE, RESPIRATORY W GRAM STAIN: Culture: NORMAL

## 2023-04-27 ENCOUNTER — Other Ambulatory Visit: Payer: Self-pay | Admitting: Oncology

## 2023-04-28 ENCOUNTER — Ambulatory Visit: Payer: Medicare Other | Attending: Infectious Diseases | Admitting: Infectious Diseases

## 2023-04-28 ENCOUNTER — Encounter: Payer: Self-pay | Admitting: Infectious Diseases

## 2023-04-28 VITALS — BP 145/80 | HR 75 | Temp 96.3°F | Ht 72.0 in | Wt 170.0 lb

## 2023-04-28 DIAGNOSIS — I7300 Raynaud's syndrome without gangrene: Secondary | ICD-10-CM | POA: Diagnosis not present

## 2023-04-28 DIAGNOSIS — F1090 Alcohol use, unspecified, uncomplicated: Secondary | ICD-10-CM | POA: Diagnosis not present

## 2023-04-28 DIAGNOSIS — E222 Syndrome of inappropriate secretion of antidiuretic hormone: Secondary | ICD-10-CM | POA: Diagnosis not present

## 2023-04-28 DIAGNOSIS — Z79890 Hormone replacement therapy (postmenopausal): Secondary | ICD-10-CM | POA: Diagnosis not present

## 2023-04-28 DIAGNOSIS — Z9221 Personal history of antineoplastic chemotherapy: Secondary | ICD-10-CM | POA: Insufficient documentation

## 2023-04-28 DIAGNOSIS — I2510 Atherosclerotic heart disease of native coronary artery without angina pectoris: Secondary | ICD-10-CM | POA: Insufficient documentation

## 2023-04-28 DIAGNOSIS — I509 Heart failure, unspecified: Secondary | ICD-10-CM | POA: Insufficient documentation

## 2023-04-28 DIAGNOSIS — I110 Hypertensive heart disease with heart failure: Secondary | ICD-10-CM | POA: Insufficient documentation

## 2023-04-28 DIAGNOSIS — C3491 Malignant neoplasm of unspecified part of right bronchus or lung: Secondary | ICD-10-CM | POA: Diagnosis not present

## 2023-04-28 DIAGNOSIS — K219 Gastro-esophageal reflux disease without esophagitis: Secondary | ICD-10-CM | POA: Insufficient documentation

## 2023-04-28 DIAGNOSIS — Z85118 Personal history of other malignant neoplasm of bronchus and lung: Secondary | ICD-10-CM | POA: Diagnosis not present

## 2023-04-28 DIAGNOSIS — R222 Localized swelling, mass and lump, trunk: Secondary | ICD-10-CM | POA: Diagnosis not present

## 2023-04-28 DIAGNOSIS — F17210 Nicotine dependence, cigarettes, uncomplicated: Secondary | ICD-10-CM | POA: Diagnosis not present

## 2023-04-28 DIAGNOSIS — G4733 Obstructive sleep apnea (adult) (pediatric): Secondary | ICD-10-CM | POA: Insufficient documentation

## 2023-04-28 DIAGNOSIS — A310 Pulmonary mycobacterial infection: Secondary | ICD-10-CM | POA: Diagnosis not present

## 2023-04-28 DIAGNOSIS — Z792 Long term (current) use of antibiotics: Secondary | ICD-10-CM | POA: Diagnosis not present

## 2023-04-28 DIAGNOSIS — D509 Iron deficiency anemia, unspecified: Secondary | ICD-10-CM | POA: Diagnosis not present

## 2023-04-28 DIAGNOSIS — Z79899 Other long term (current) drug therapy: Secondary | ICD-10-CM | POA: Diagnosis not present

## 2023-04-28 DIAGNOSIS — Z923 Personal history of irradiation: Secondary | ICD-10-CM | POA: Diagnosis not present

## 2023-04-28 DIAGNOSIS — E039 Hypothyroidism, unspecified: Secondary | ICD-10-CM | POA: Diagnosis not present

## 2023-04-28 DIAGNOSIS — J4489 Other specified chronic obstructive pulmonary disease: Secondary | ICD-10-CM | POA: Diagnosis not present

## 2023-04-28 DIAGNOSIS — F419 Anxiety disorder, unspecified: Secondary | ICD-10-CM | POA: Diagnosis not present

## 2023-04-28 NOTE — Progress Notes (Signed)
NAME: Paul Carpenter  DOB: 05/05/1956  MRN: [id number]  Date/Time: 04/28/2023 5:50 PM  Subjective:  Paul Carpenter is a 67 y.o. 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 About susceptibility was exactly the same with no resistance to rifampin or azithromycin.  The plan was to send him to [facility].  I discussed with Dr. Garner Nash at [facility] who is a specialist taking care of patients with MAC or bronchiectasis.  He has an appointment sometime next week.  Meanwhile patient had gone to vacation and started eating ice creams and has gained about 8 pounds. Patient still complains of a burning sensation in the right side of his chest especially when he is coughing.  Recent sputum  culture grew no bacteria. Doing okay Says the anti MAC treatment is making him sick - but able to take it every day   Smoking 2 packs/day- cut down from 3  He has not had any alcohol for the past month   Following taken from last note  Patient has a complicated lung history As per Dr. Milinda Cave note he was initially diagnosed with non-small cell carcinoma of the lung in 2019. On 04/11/2018 a bronch with EBUS done and biopsy taken of subcarinal mass- it showed  - POSITIVE FOR MALIGNANT CELLS IN A NECROTIC BACKGROUND.  MRI of the brain completed on Mar 28, 2018 did not reveal any metastatic disease.  He completed radiation June 26, 2018.  He completed concurrent single agent  carboplatinum on June 21, 2018.  He had a reaction to Taxol during cycle 1 and this was discontinued.  He completed a year-long maintenance subdural level lab on June 27, 2019.  In July 2022 there was a concern for slight progression of the disease.  PET scan done on 06/03/2021 revealed a spiculated cavitary mass in the apical segment of the right upper lobe with similar SUV which was 3.5 versus 3.31-year ago.  So this was reassuring as per the radiologist.  However disease progression could not be definitely excluded.   no metastasis seen.  A CT scan done on 07/20/2021 showed continued slow evolution of a thick-walled cavitary mass in the right upper lobe which remains concerning for slow-growing neoplasm.  He had a fall and fractured his C 2 in December , 2022.  He was in [facility] at that time.  CTs chest that was done that showed a large right upper lobe soft tissue mass with similar abnormal tissue involving the right pulmonary hilum extending medially into the ipsilateral mediastinum in keeping with a reported history of primary lung cancer. In January he was hospitalized at [facility] for hyponatremia.  At that time oncologist saw him and got another PET scan.  A bronchoscopy/biopsy could not be done due to C2 fracture.  A PET scan was done on 11/16/2021 and it showed similar size of the cavitary process in the right upper lobe.  Increased size and hypermetabolism compared to the PET on 06/02/2021.  Hypermetabolic pulmonary nodules many of  which were new compared to the CAT scan of 10/20/2021.    He was diagnosed to have recurrence and was started on palliative chemo with  taxotere( 4 cycles) on 11/26/21- completed on 01/28/22 He was hospitalized  02/03/22-02/05/22 for sob and cough and fever and sputum sent which was pseudomonas, but realized after his discharge He saw Dr.[name] on 02/09/22 and received a 2 week course of cipro for necrotizing pneumonia with pseudomonas. A repeat PET scan done on 02/23/22 showed  Thick-walled cavitary mass in the right upper lobe has decreased in overall hypermetabolism, indicative of treatment response. 2. Multifocal areas of nodular consolidation and peribronchovascular nodularity with a waxing and waning appearance, suggesting combination slight improvement in metastatic disease with a superimposed atypical/fungal infectious process. 3. Mildly hypermetabolic low right paratracheal and right hilar lymph nodes, metastatic or reactive. 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 and he was referred to me for treatment He has been taking ethambutol, and Azithromycin since June 2023 and added  rifampin since September 2023 Past Medical History:  Diagnosis Date   Anginal pain (HCC)    Anxiety    Asthma    Chest pain    CHF (congestive heart failure) (HCC)    Chicken pox    Complication of anesthesia    o2 dropped after neck fusion   COPD (chronic obstructive pulmonary disease) (HCC)    Coronary artery disease    Cough    chronic  clear phlegm   Dysrhythmia    palpitations   GERD (gastroesophageal reflux disease)    h/o reflux/ hoarsness   Hematochezia    Hemorrhoids    History of chickenpox    History of colon polyps    History of Helicobacter pylori infection    Hoarseness    Hypertension    Lung cancer (HCC) 05/2016   Chemo + rad tx's.    Migraines    OSA (obstructive sleep apnea)    has CPAP but does not use   Personal history of tobacco use, presenting hazards to health 03/05/2016   Pneumonia    5/17   Raynaud disease    Raynaud disease    Raynaud's disease    Rotator cuff tear    on right   Shortness of breath dyspnea    Sleep apnea    Ulcer (traumatic) of oral mucosa     Past Surgical History:  Procedure Laterality Date   BACK SURGERY     cervical fusion x 2   CARDIAC CATHETERIZATION     CERVICAL DISCECTOMY     x 2   COLONOSCOPY     COLONOSCOPY N/A 07/25/2015   Procedure: COLONOSCOPY;  Surgeon: Christena Deem, MD;  Location:  [facility] ENDOSCOPY;  Service: Endoscopy;  Laterality: N/A;   COLONOSCOPY WITH PROPOFOL N/A 10/04/2017   Procedure: COLONOSCOPY WITH PROPOFOL;  Surgeon: Christena Deem, MD;  Location: [facility] ENDOSCOPY;  Service: Endoscopy;  Laterality: N/A;   COLONOSCOPY WITH PROPOFOL N/A 07/10/2020   Procedure: COLONOSCOPY WITH PROPOFOL;  Surgeon: Toledo, Boykin Nearing, MD;  Location: [facility] ENDOSCOPY;  Service: Gastroenterology;  Laterality: N/A;   ELECTROMAGNETIC NAVIGATION BROCHOSCOPY Left 06/28/2016   Procedure: ELECTROMAGNETIC NAVIGATION BRONCHOSCOPY;  Surgeon: Stephanie Acre, MD;  Location: [facility] ORS;  Service: Cardiopulmonary;  Laterality: Left;   ENDOBRONCHIAL ULTRASOUND N/A 04/11/2018   Procedure: ENDOBRONCHIAL ULTRASOUND;  Surgeon: Erin Fulling, MD;  Location: [facility] ORS;  Service: Cardiopulmonary;  Laterality: N/A;   ESOPHAGOGASTRODUODENOSCOPY N/A 07/25/2015   Procedure: ESOPHAGOGASTRODUODENOSCOPY (EGD);  Surgeon: Cindra Eves  Marva Panda, MD;  Location: [facility] ENDOSCOPY;  Service: Endoscopy;  Laterality: N/A;   ESOPHAGOGASTRODUODENOSCOPY (EGD) WITH PROPOFOL N/A 07/10/2020   Procedure: ESOPHAGOGASTRODUODENOSCOPY (EGD) WITH PROPOFOL;  Surgeon: Toledo, Boykin Nearing, MD;  Location: [facility] ENDOSCOPY;  Service: Gastroenterology;  Laterality: N/A;   NASAL SINUS SURGERY     x 2    PORTA CATH INSERTION N/A 04/24/2018   Procedure: PORTA CATH INSERTION;  Surgeon: Annice Needy, MD;  Location: [facility] INVASIVE CV LAB;  Service: Cardiovascular;  Laterality: N/A;   rotator cuff surgery Right    07/2016   SEPTOPLASTY     SKIN GRAFT      Social History   Socioeconomic History   Marital status: Married    Spouse name: Not on file   Number of children: 3   Years of education: Not on file   Highest education level: Not on file  Occupational History   Occupation: Holiday representative Work  Tobacco Use   Smoking status: Every Day    Packs/day: 3.00    Years: 50.00    Additional pack years: 0.00    Total pack years: 150.00    Types: Cigarettes     Passive exposure: Current   Smokeless tobacco: Never   Tobacco comments:    2 PPD 02/08/2023  Vaping Use   Vaping Use: Never used  Substance and Sexual Activity   Alcohol use: Yes    Alcohol/week: 2.0 standard drinks of alcohol    Types: 2 Standard drinks or equivalent per week    Comment: moderate   Drug use: No   Sexual activity: Not on file  Other Topics Concern   Not on file  Social History Narrative   Not on file   Social Determinants of Health   Financial Resource Strain: Low Risk  (07/22/2022)   Overall Financial Resource Strain (CARDIA)    Difficulty of Paying Living Expenses: Not very hard  Food Insecurity: No Food Insecurity (09/11/2022)   Hunger Vital Sign    Worried About Running Out of Food in the Last Year: Never true    Ran Out of Food in the Last Year: Never true  Transportation Needs: No Transportation Needs (09/11/2022)   PRAPARE - Administrator, Civil Service (Medical): No    Lack of Transportation (Non-Medical): No  Physical Activity: Inactive (07/22/2022)   Exercise Vital Sign    Days of Exercise per Week: 0 days    Minutes of Exercise per Session: 0 min  Stress: Stress Concern Present (07/22/2022)   [employer] of Occupational Health - Occupational Stress Questionnaire    Feeling of Stress : Rather much  Social Connections: Moderately Integrated (07/22/2022)   Social Connection and Isolation Panel [NHANES]    Frequency of Communication with Friends and Family: Twice a week    Frequency of Social Gatherings with Friends and Family: Twice a week    Attends Religious Services: 1 to 4 times per year    Active Member of [employer] or Organizations: No    Attends Engineer, structural: Not on file    Marital Status: Married  Catering manager Violence: Not At Risk (09/11/2022)   Humiliation, Afraid, Rape, and Kick questionnaire    Fear of Current or Ex-Partner: No    Emotionally Abused: No    Physically Abused: No    Sexually Abused:  No    Family History  Problem Relation Age of Onset   Heart disease Father    Prostate cancer Father    Heart  disease Paternal Grandmother    Heart attack Maternal Grandfather 73   Kidney cancer Neg Hx    Bladder Cancer Neg Hx    Other Neg Hx        pituitary abnormality   Allergies  Allergen Reactions   Lisinopril Rash   Varenicline Rash   I? Current Outpatient Medications  Medication Sig Dispense Refill   acetaminophen (TYLENOL) 500 MG tablet Take 500 mg by mouth daily as needed.     albuterol (PROVENTIL) (2.5 MG/3ML) 0.083% nebulizer solution TAKE 3 MLS (2.5 MG TOTAL) BY NEBULIZATION 4 (FOUR) TIMES DAILY AS NEEDED FOR WHEEZING OR SHORTNESS OF BREATH. 90 mL 2   albuterol (VENTOLIN HFA) 108 (90 Base) MCG/ACT inhaler INHALE 2 PUFFS BY MOUTH EVERY 4 HOURS AS NEEDED FOR WHEEZE OR FOR SHORTNESS OF BREATH 8.5 each 2   atorvastatin (LIPITOR) 10 MG tablet Take 10 mg by mouth daily.     azithromycin (ZITHROMAX) 500 MG tablet Take 1 tablet (500 mg total) by mouth daily. 30 tablet 6   Budeson-Glycopyrrol-Formoterol (BREZTRI AEROSPHERE) 160-9-4.8 MCG/ACT AERO Inhale 2 puffs into the lungs in the morning and at bedtime. 5.9 g 0   diazepam (VALIUM) 5 MG tablet Take 5 mg by mouth at bedtime as needed.     dronabinol (MARINOL) 5 MG capsule Take 1 capsule (5 mg total) by mouth 2 (two) times daily before lunch and supper. 60 capsule 0   ethambutol (MYAMBUTOL) 400 MG tablet Take 3 tablets (1,200 mg total) by mouth daily. 90 tablet 6   ferrous sulfate 325 (65 FE) MG tablet Take 325 mg by mouth at bedtime.     hydrocortisone (CORTEF) 10 MG tablet Take two tablets in the morning. Take one tablet in the afternoon (between 2-4pm).     levothyroxine (SYNTHROID) 200 MCG tablet Take 200 mcg by mouth daily before breakfast.     lidocaine-prilocaine (EMLA) cream APPLY 1 APPLICATION TOPICALLY AS NEEDED. 30 g 1   magnesium oxide (MAG-OX) 400 MG tablet Take 600 mg by mouth 2 (two) times daily. Takes 1.5 tablet  twice daily     nitroGLYCERIN (NITROSTAT) 0.4 MG SL tablet Place under the tongue.     OXYGEN Inhale 2 L into the lungs at bedtime.     pantoprazole (PROTONIX) 40 MG tablet Take 1 tablet (40 mg total) by mouth 2 (two) times daily before meals     rifampin (RIFADIN) 300 MG capsule Take 2 capsules (600 mg total) by mouth daily. 60 capsule 6   sodium chloride 1 g tablet Take 1 tablet (1 g total) by mouth 3 (three) times daily with meals. 90 tablet 1   tadalafil (CIALIS) 5 MG tablet Take 1 tablet (5 mg total) by mouth daily as needed for erectile dysfunction. 90 tablet 3   No current facility-administered medications for this visit.     Abtx:  Anti-infectives (From admission, onward)    None       REVIEW OF SYSTEMS:  Const: No fever or chills.  Has gained about 7 pounds of weight Eyes: negative diplopia or visual changes, negative eye pain ENT: negative coryza, negative sore throat Resp: cough, , dyspnea rt sided pleuritic chest pain better than before Cards: negative for chest pain, palpitations, lower extremity edema GU: negative for frequency, dysuria and hematuria GI: poor appetite , weight loss Skin: negative for rash and pruritus Heme: negative for easy bruising and gum/nose bleeding MS: fatigue Neurolo:negative for headaches, dizziness, vertigo, memory problems  Psych:depression  Endocrine: negative  for thyroid, diabetes Allergy/Immunology-as above: Continues to smoke2 pack/day-  VITALS:  BP (!) 154/81   Pulse 75   Temp (!) 96.3 F (35.7 C) (Temporal)   Ht 6' (1.829 m)   Wt 170 lb (77.1 kg)   SpO2 96%   BMI 23.06 kg/m     PHYSICAL EXAM:  General: Alert, cooperative, no  distress at rest,   Lungs: b/l air entry Rhonchi and crepts on rt side Heart: Regular rate and rhythm, no murmur, rub or gallop. Abdomen: did not examine Extremities: atraumatic, no cyanosis. No edema. No clubbing Skin: No rashes or lesions. Or bruising Lymph: Cervical, supraclavicular  normal. Neurologic: Grossly non-focal  Labs    Latest Ref Rng & Units 03/22/2023    1:28 PM 02/09/2023    9:28 PM 02/08/2023    9:55 AM  CBC  WBC 4.0 - 10.5 K/uL 6.7  6.3  7.1   Hemoglobin 13.0 - 17.0 g/dL 40.9  81.1  91.4   Hematocrit 39.0 - 52.0 % 35.9  35.6  38.8   Platelets 150 - 400 K/uL 178  195  211        Latest Ref Rng & Units 03/22/2023    1:28 PM 02/22/2023    9:55 AM 02/09/2023    9:28 PM  CMP  Glucose 70 - 99 mg/dL 782  956  85   BUN 8 - 23 mg/dL 8  10  13    Creatinine 0.61 - 1.24 mg/dL 2.13  0.86  5.78   Sodium 135 - 145 mmol/L 128  129  127   Potassium 3.5 - 5.1 mmol/L 4.2  4.5  4.2   Chloride 98 - 111 mmol/L 95  95  94   CO2 22 - 32 mmol/L 26  28  27    Calcium 8.9 - 10.3 mg/dL 8.4  8.7  8.7   Total Protein 6.5 - 8.1 g/dL 6.2   6.4   Total Bilirubin 0.3 - 1.2 mg/dL 0.6   0.5   Alkaline Phos 38 - 126 U/L 70   74   AST 15 - 41 U/L 23   22   ALT 0 - 44 U/L 15   13     s  ? Impression/Recommendation 68 year-old male current smoker and alcohol use with  recurrent non-small cell CA lung on the right side.  In 2019 had undergone chemo, radiation and immunotherapy Since January 2023 he had taken 4 cycles of palliative chemotherapy. No chemo since the past 12  months No biopsy was taken the second time around because of C2 fracture  Neck cavitary/necrotizing mass on the right upper lobe/middle lobe Being treated for MAC - on azithromycin, ethambutol  since June 2023 and rifampin since sept 2023  Three sputums for afb in Dec 2023 was neg for MAC, but in May 2023 all 3 were positive. Patient is continuing ethambutol, rifampin and azithromycin. Will refer to Dr. Garner Nash at [facility] bronchiectasis/mycobacteria clinic Has an appointment next week.  Will repeat  sputum for afb- every 5-6 months Continue triple therapy   Hyperlipidemia on atorvastatin. With rifampin the level can decrease or increase- he has no stents-   COPD  Hypothyroidism on Synthroid- recently  increased   Hyponatremia due to SIADH- recently admitted  for that. On sodium tablets+ magnesium  Adrenal insufficiency-Addison's disease- on hydrocortisone- managed by Dr.Solum  Poor appetite- on marinol with not much help- also expensive prescription   Discussed with patient and his wife in detail Follow up after  [facility] visit  __________________________________________________

## 2023-04-29 ENCOUNTER — Telehealth: Payer: Self-pay

## 2023-04-29 NOTE — Telephone Encounter (Signed)
Patient calling to see if the prior auth has been received/completed for lidocaine cream. He states that he has a port flush on Tuesday and would like to have the cream by then.

## 2023-05-03 ENCOUNTER — Inpatient Hospital Stay: Payer: Medicare Other | Attending: Oncology

## 2023-05-03 ENCOUNTER — Telehealth: Payer: Self-pay | Admitting: *Deleted

## 2023-05-03 DIAGNOSIS — Z452 Encounter for adjustment and management of vascular access device: Secondary | ICD-10-CM | POA: Insufficient documentation

## 2023-05-03 DIAGNOSIS — C342 Malignant neoplasm of middle lobe, bronchus or lung: Secondary | ICD-10-CM | POA: Diagnosis present

## 2023-05-03 DIAGNOSIS — Z95828 Presence of other vascular implants and grafts: Secondary | ICD-10-CM

## 2023-05-03 MED ORDER — SODIUM CHLORIDE 0.9% FLUSH
10.0000 mL | Freq: Once | INTRAVENOUS | Status: AC
  Administered 2023-05-03: 10 mL via INTRAVENOUS
  Filled 2023-05-03: qty 10

## 2023-05-03 MED ORDER — HEPARIN SOD (PORK) LOCK FLUSH 100 UNIT/ML IV SOLN
500.0000 [IU] | Freq: Once | INTRAVENOUS | Status: AC
  Administered 2023-05-03: 500 [IU] via INTRAVENOUS
  Filled 2023-05-03: qty 5

## 2023-05-03 NOTE — Telephone Encounter (Signed)
Patient called reporting that he has been referred to [facility] and they want to use his port to give him antibiotics and he is unsure if he should let them access his port. He is requesting a return call to discuss this [phone]

## 2023-05-03 NOTE — Telephone Encounter (Signed)
Call returned to patient and informed per Dr Orlie Dakin that Paul Carpenter can use his port. He thanked me for letting him know stating "OK, that's all I need to know"

## 2023-05-05 ENCOUNTER — Other Ambulatory Visit: Payer: Self-pay | Admitting: Pulmonary Disease

## 2023-05-10 ENCOUNTER — Other Ambulatory Visit
Admission: RE | Admit: 2023-05-10 | Discharge: 2023-05-10 | Disposition: A | Discharge status: Home / Self Care | Payer: Medicare Other | Source: Ambulatory Visit | Attending: Nephrology | Admitting: Nephrology

## 2023-05-10 DIAGNOSIS — E871 Hypo-osmolality and hyponatremia: Secondary | ICD-10-CM | POA: Diagnosis present

## 2023-05-10 LAB — BASIC METABOLIC PANEL
Anion gap: 7 (ref 5–15)
BUN: 6 mg/dL — ABNORMAL LOW (ref 8–23)
CO2: 28 mmol/L (ref 22–32)
Calcium: 8.8 mg/dL — ABNORMAL LOW (ref 8.9–10.3)
Chloride: 92 mmol/L — ABNORMAL LOW (ref 98–111)
Creatinine, Ser: 0.59 mg/dL — ABNORMAL LOW (ref 0.61–1.24)
GFR, Estimated: >60 mL/min (ref >60)
Glucose, Bld: 91 mg/dL (ref 70–99)
Potassium: 4.3 mmol/L (ref 3.5–5.1)
Sodium: 127 mmol/L — ABNORMAL LOW (ref 135–145)

## 2023-05-11 ENCOUNTER — Telehealth: Payer: Self-pay | Admitting: *Deleted

## 2023-05-11 ENCOUNTER — Telehealth: Payer: Self-pay | Admitting: Pulmonary Disease

## 2023-05-11 NOTE — Telephone Encounter (Signed)
Patient called asking if Dr Orlie Dakin would order a CT scan Although he made the same request to Dr Jayme Cloud and she had said that it would not tell info he needs. I mentioned this to him and he said that the reason he wants it is because he wants to know if there is any possibility that it is his cancer because he does not want to go through daily 7 days a week of infusions for 8/12 weeks "if there is a chance that some of this is cancer and not the M thing". Please advise

## 2023-05-11 NOTE — Telephone Encounter (Signed)
The CT would not tell us the information that he needs.  He had a sputum collected while on therapy for the M avium that was positive for him still shedding organisms.  This is the study that has triggered the need for the infusions.  He should also discuss with Dr. Rivka Safer.

## 2023-05-11 NOTE — Telephone Encounter (Signed)
Call returned to patient and advised of Dr Milinda Cave response. He said he will discuss with ID and he isn't sure he wants to go through the infusions or not

## 2023-05-11 NOTE — Telephone Encounter (Signed)
Called and spoke to patient.  He was referred to [facility] for a second opinion. They are wanting him to start on a 8-12 week course of infusion abx. He is concerned about side effects of kidney damage and vision loss with the infusion. He would like a CT to see if there is any improvement before making a determination about the abx.  Dr. Jayme Cloud, please advise. Thanks

## 2023-05-11 NOTE — Telephone Encounter (Signed)
PT calling asking if we can sched him a CT w/Contrast STAT.   We referred him to [facility] and they are wanting to start him on an 8-12 week infusion of antibx with a pick line. He is concerned about the side effects such as  kidney damage and loss of eye sight. He seeks Dr. Timoteo Expose advise. His # is [phone]

## 2023-05-11 NOTE — Telephone Encounter (Signed)
Patient is aware of recommendations and voiced his understanding.  Nothing further needed. 

## 2023-05-12 ENCOUNTER — Telehealth: Payer: Self-pay

## 2023-05-12 ENCOUNTER — Other Ambulatory Visit: Payer: Self-pay | Admitting: Pulmonary Disease

## 2023-05-12 DIAGNOSIS — A310 Pulmonary mycobacterial infection: Secondary | ICD-10-CM

## 2023-05-12 NOTE — Telephone Encounter (Signed)
Patient's wife called office to speak with Dr. Rivka Safer regarding treatment plan. States that they would like to have CT done before perusing MAC treatment.  Would like to know if infections is progressing or the same. Updated Dr. Rivka Safer. Juanita Laster, RMA

## 2023-05-13 ENCOUNTER — Ambulatory Visit
Admission: RE | Admit: 2023-05-13 | Discharge: 2023-05-13 | Disposition: A | Discharge status: Home / Self Care | Payer: Medicare Other | Source: Ambulatory Visit | Attending: Pulmonary Disease | Admitting: Pulmonary Disease

## 2023-05-13 DIAGNOSIS — A310 Pulmonary mycobacterial infection: Secondary | ICD-10-CM | POA: Diagnosis not present

## 2023-05-19 ENCOUNTER — Ambulatory Visit: Payer: Medicare Other | Admitting: Pulmonary Disease

## 2023-05-19 ENCOUNTER — Encounter: Payer: Self-pay | Admitting: Pulmonary Disease

## 2023-05-19 VITALS — BP 118/62 | HR 81 | Temp 98.0°F | Ht 72.0 in | Wt 168.6 lb

## 2023-05-19 DIAGNOSIS — C3491 Malignant neoplasm of unspecified part of right bronchus or lung: Secondary | ICD-10-CM

## 2023-05-19 DIAGNOSIS — J449 Chronic obstructive pulmonary disease, unspecified: Secondary | ICD-10-CM | POA: Diagnosis not present

## 2023-05-19 DIAGNOSIS — I429 Cardiomyopathy, unspecified: Secondary | ICD-10-CM

## 2023-05-19 DIAGNOSIS — J439 Emphysema, unspecified: Secondary | ICD-10-CM | POA: Diagnosis not present

## 2023-05-19 DIAGNOSIS — R0602 Shortness of breath: Secondary | ICD-10-CM

## 2023-05-19 DIAGNOSIS — A310 Pulmonary mycobacterial infection: Secondary | ICD-10-CM

## 2023-05-19 DIAGNOSIS — G4736 Sleep related hypoventilation in conditions classified elsewhere: Secondary | ICD-10-CM

## 2023-05-19 DIAGNOSIS — E222 Syndrome of inappropriate secretion of antidiuretic hormone: Secondary | ICD-10-CM

## 2023-05-19 DIAGNOSIS — F17210 Nicotine dependence, cigarettes, uncomplicated: Secondary | ICD-10-CM

## 2023-05-19 NOTE — Progress Notes (Signed)
Subjective:    Patient ID: Paul Carpenter, male    DOB: 02/23/1956, 67 y.o.   MRN: [id number]  Patient Care Team: Barbette Reichmann, MD as PCP - General (Internal Medicine) Wyn Quaker Marlow Baars, MD as Referring Physician (Vascular Surgery) Jeralyn Ruths, MD as Consulting Physician (Oncology) Carmina Miller, MD as Referring Physician (Radiation Oncology) Salena Saner, MD as Consulting Physician (Pulmonary Disease) Lynn Ito, MD as Consulting Physician (Infectious Diseases)  Chief Complaint  Patient presents with   Follow-up    CT 7/12-SOB with exertion and at rest, prod cough with green sputum and wheezing. Using albuterol Q4H.     HPI Paul Carpenter is a very complex 67 year old current smoker (2 PPD, 150 PY) with a history as noted below, who presents for follow-up of a cavitary right upper lobe process in the setting of history of non-small cell carcinoma of the lung, stage II COPD and chronic dyspnea/fatigue.  Recall that the patient was noted to have recurrence of stage IIIa non-small cell lung cancer of the right middle lobe.  Was noted to have suspicion of progression of disease on a PET/CT of 16 November 2021.  He patient at that time could not undergo procedure such as bronchoscopy due to recent C2 cervical fracture.  He received a final cycle of Taxotere January 28, 2022.  On 04 February 2022 he was admitted to [facility] for observation due to acute on chronic hypoxemic respiratory failure, at that time patient was having issues with fever up to 102.4 and purulent sputum production.  A CT of the chest at that time was performed to assess for PE.  This showed progressive multi cavitary lesion occupying a good portion of the right upper lobe and enlarging right infrahilar mediastinal lymph nodes and several small solid pulmonary nodules.  There was also patchy nodular airspace disease with surrounding tree-in-bud appearance (this has been present since as far back as 2016) there are also  severe underlying emphysematous changes.  Because of these findings MAC was suspected and infectious disease and pulmonary disease consultants were asked to render opinion. The patient was seen by Dr. Rivka Safer and by Dr. Karna Christmas.  The patient requested early discharge home and was sent home on Augmentin and azithromycin for suspected community-acquired pneumonia and possible MAC.  Subsequently the patient saw me on 09 February 2022 as Dr. Belia Heman, his primary pulmonologist, was unavailable.  At that time he continued to have issues with malaise and right-sided pleuritic chest pain.  Since his discharge microbiology was resulted and the patient is growing Pseudomonas aeruginosa.  The patient was treated for Pseudomonas aeruginosa and improved somewhat.  He then had recurrence of cough with sputum production after that therapy and sputum was recollected this time showing M avium complex. The patient is on triple M avium therapy directed by infectious diseases.   Patient presents today for follow-up he was last seen by me on 08 February 2023 at that time he was directed to stop smoking, he was instructed to continue [place], continue as needed albuterol.  He was also instructed to continue his nocturnal oxygen.  He was also noted order to have an echocardiogram which showed that his LVEF was decreased to 35 to 40% with global hypokinesis.  He was referred back to his cardiologist at [facility].  He was evaluated on 21 March but no interventions have been recommended.  This may be adding to the patient's dyspnea and fatigue.  Today he presents with his usual symptoms of dyspnea,  fatigue and in general not feeling well.  He cannot specify what not feeling well as.  He continues to smoke 2 packs of cigarettes per day, he did stop drinking alcohol.  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  Has not had any hemoptysis.He has no neurologic complaints.  He denies any  fevers,chills or sweats. He denies any nausea, vomiting, constipation, or diarrhea. He has no urinary complaints.   He is on oxygen at 2.5 L/min nocturnally has a history of sleep apnea but cannot tolerate CPAP and has not been on this for years.  He is erratic with his compliance on nocturnal O2.    He has been followed by ID for his M avium complex.  He has been taking ethambutol and azithromycin since June 2023 and rifampin was added in September 2023.  He has been 100% adherent with this according to ID notes.  He had 3 sputum negative for AFB on 06 October 2022, 07 October 2022 and 08 October 2022.  However on 16 Mar 2023 he had sputum collected and it was positive for MAC at that point.  He was referred to [facility] for evaluation of his systemic MAC shedding.  It was recommended that he receive infusions of amikacin.  The patient has been reluctant to undergo the therapy due to side effects from the amikacin.  He will need an 8 to 12-week treatment in addition to continuing his current ethambutol, azithromycin and rifampin.  He had a CT chest on July 2024  that was reviewed independently, radiology interpretation is still pending, study shows a stable large thick-walled cavitary mass involving the right upper lobe, innumerable solid pulmonary nodules findings likely due to superimposed nontuberculous Mycobacterium infection.  Stable enlarged right hilar node chronic rib fractures on the right.    He does not endorse any new symptomatology.  We discussed smoking cessation extensively, he is not motivated to quit.  Review of Systems A 10 point review of systems was performed and it is as noted above otherwise negative.   Patient Active Problem List   Diagnosis Date Noted   Hyponatremia 09/11/2022   Generalized weakness 09/10/2022   Shortness of breath 02/03/2022   SIADH (syndrome of inappropriate ADH production) (HCC) 09/22/2021   Overweight (BMI 25.0-29.9) 09/22/2021   Hypomagnesemia 09/21/2021    Elevated hemoglobin A1c 06/19/2020   Hypothyroidism, acquired 05/30/2019   Squamous cell lung cancer, right (HCC) 05/30/2019   HFrEF (heart failure with reduced ejection fraction) (HCC) 03/06/2019   Atherosclerosis 12/14/2018   Non-small cell lung cancer, right (HCC) 04/24/2018   Oral ulcer 02/16/2018   Pituitary disorder (HCC) 02/16/2018   Migraine headache 03/07/2017   HCAP (healthcare-associated pneumonia) 09/11/2016   COPD exacerbation (HCC) 09/11/2016   Chronic hyponatremia 09/11/2016   Leukocytosis 09/11/2016   Thrombocytopenia (HCC) 09/11/2016   Cigarette smoker 06/11/2016   Cervical radiculopathy 04/15/2016   Cervical disc disorder at C5-C6 level with radiculopathy 03/09/2016   Impingement syndrome of right shoulder 03/09/2016   Health care maintenance 09/29/2015   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   Benign essential hypertension 05/28/2015   Polycythemia 03/24/2015   Carotid artery disease (HCC) 12/12/2014   Disequilibrium 12/12/2014   Mixed hyperlipidemia 12/10/2014   Incomplete emptying of bladder 06/04/2014   Anxiety 05/18/2014   Chronic coronary artery disease 05/18/2014   Chronic headaches 05/18/2014   Acute shoulder pain 03/15/2014   Impingement syndrome of left shoulder 03/15/2014   Lung mass 12/06/2013  Kidney stone 11/24/2013   COPD (chronic obstructive pulmonary disease) (HCC) 04/24/2013   Obstructive sleep apnea 04/24/2013   Benign localized prostatic hyperplasia with lower urinary tract symptoms (LUTS) 07/04/2012   Encounter for long-term current use of medication 07/04/2012   ED (erectile dysfunction) of organic origin 07/04/2012   Testicular hypofunction 07/04/2012    Social History   Tobacco Use   Smoking status: Every Day    Current packs/day: 3.00    Average packs/day: 3.0 packs/day for 50.0 years (150.0 ttl pk-yrs)    Types: Cigarettes    Passive exposure: Current   Smokeless tobacco: Never   Tobacco comments:    2 PPD 05/19/2023  Substance Use  Topics   Alcohol use: Yes    Alcohol/week: 2.0 standard drinks of alcohol    Types: 2 Standard drinks or equivalent per week    Comment: moderate    Allergies  Allergen Reactions   Lisinopril Rash   Varenicline Rash    Current Meds  Medication Sig   acetaminophen (TYLENOL) 500 MG tablet Take 500 mg by mouth daily as needed.   albuterol (PROVENTIL) (2.5 MG/3ML) 0.083% nebulizer solution TAKE 3 MLS (2.5 MG TOTAL) BY NEBULIZATION 4 (FOUR) TIMES DAILY AS NEEDED FOR WHEEZING OR SHORTNESS OF BREATH.   albuterol (VENTOLIN HFA) 108 (90 Base) MCG/ACT inhaler INHALE 2 PUFFS BY MOUTH EVERY 4 HOURS AS NEEDED FOR WHEEZE OR FOR SHORTNESS OF BREATH   atorvastatin (LIPITOR) 10 MG tablet Take 10 mg by mouth daily.   azithromycin (ZITHROMAX) 500 MG tablet Take 1 tablet (500 mg total) by mouth daily.   Budeson-Glycopyrrol-Formoterol (BREZTRI AEROSPHERE) 160-9-4.8 MCG/ACT AERO Inhale 2 puffs into the lungs in the morning and at bedtime.   diazepam (VALIUM) 5 MG tablet Take 5 mg by mouth at bedtime as needed.   dronabinol (MARINOL) 5 MG capsule Take 1 capsule (5 mg total) by mouth 2 (two) times daily before lunch and supper.   ethambutol (MYAMBUTOL) 400 MG tablet Take 3 tablets (1,200 mg total) by mouth daily.   ferrous sulfate 325 (65 FE) MG tablet Take 325 mg by mouth at bedtime.   hydrocortisone (CORTEF) 10 MG tablet Take two tablets in the morning. Take one tablet in the afternoon (between 2-4pm).   levothyroxine (SYNTHROID) 200 MCG tablet Take 200 mcg by mouth daily before breakfast.   lidocaine-prilocaine (EMLA) cream APPLY 1 APPLICATION TOPICALLY AS NEEDED.   magnesium oxide (MAG-OX) 400 MG tablet Take 600 mg by mouth 2 (two) times daily. Takes 1.5 tablet twice daily   nitroGLYCERIN (NITROSTAT) 0.4 MG SL tablet Place under the tongue.   OXYGEN Inhale 2 L into the lungs at bedtime.   pantoprazole (PROTONIX) 40 MG tablet Take 1 tablet (40 mg total) by mouth 2 (two) times daily before meals   rifampin  (RIFADIN) 300 MG capsule Take 2 capsules (600 mg total) by mouth daily.   sodium chloride 1 g tablet Take 1 tablet (1 g total) by mouth 3 (three) times daily with meals.   tadalafil (CIALIS) 5 MG tablet Take 1 tablet (5 mg total) by mouth daily as needed for erectile dysfunction.    Immunization History  Administered Date(s) Administered   Fluad Quad(high Dose 65+) 09/22/2020   Influenza Inj Mdck Quad Pf 09/06/2016, 10/02/2019, 09/09/2021, 09/03/2022   Influenza Split 08/05/2013, 07/23/2014, 08/18/2015   Influenza Whole 08/01/2012   Influenza,inj,Quad PF,6+ Mos 08/14/2018   Influenza-Unspecified 09/06/2016, 08/17/2017, 09/09/2021   Zoster Recombinant(Shingrix) 06/19/2020      Objective:  BP 118/62 (BP Location: Left Arm, Cuff Size: Normal)   Pulse 81   Temp 98 F (36.7 C) (Temporal)   Ht 6' (1.829 m)   Wt 168 lb 9.6 oz (76.5 kg)   SpO2 97%   BMI 22.87 kg/m   SpO2: 97 % O2 Device: None (Room air)  GENERAL: Well-developed, well-nourished gentleman, no respiratory distress.  Fully ambulatory.  No conversational dyspnea.  Fully alert and oriented. HEAD: Normocephalic, atraumatic.  EYES: Pupils equal, round, reactive to light.  No scleral icterus.  MOUTH: Oral mucosa moist.  No thrush. NECK: Supple. No thyromegaly. Trachea midline. No JVD.  No adenopathy. PULMONARY: Good air entry bilaterally.  Coarse, otherwise, no adventitious sounds. CARDIOVASCULAR: S1 and S2. Regular rate and rhythm.  No rubs, murmurs or gallops heard. ABDOMEN: Benign. MUSCULOSKELETAL: No joint deformity, no clubbing, no edema.  NEUROLOGIC: Mild psychomotor retardation.  No overt focal deficit.  No gait disturbance.  Speech is fluent. SKIN: Intact,warm,dry. PSYCH: Depressed mood and flat affect.      I have independently reviewed the CT chest performed 13 May 2023 there has been no significant change from prior.  Formal interpretation by radiology pending.   Assessment & Plan:     ICD-10-CM   1.  Shortness of breath  R06.02 Pulmonary Function Test [facility] Only   Multifactorial COPD/cardiomyopathy with low EF Ongoing tobacco use    2. Stage 2 moderate COPD by GOLD classification (HCC)  J44.9    Continue Breztri 2 puffs twice a day Continue as needed albuterol Recheck PFTs STOP SMOKING!    3. 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 Cardiomyopathy, unspecified type (HCC)  I42.9    This issue adds complexity to his management Follows with cardiology LVEF 35% to 40%    6. SIADH (syndrome of inappropriate ADH production) (HCC)  E22.2    Likely related to pulmonary process/MAC medications Sodium stable at 127    7. Squamous cell lung cancer, right [facility])  C34.91    This issue adds complexity to his management Completed XRT/carboplatin Intolerant of Taxol Completed 1 year Durvalumab Follows with oncology    8. Tobacco dependence due to cigarettes  F17.210    Continues to smoke 2 packs of cigarettes per day No motivation to quit Counseled with regards to discontinuation of smoking     Orders Placed This Encounter  Procedures   Pulmonary Function Test [facility] Only    ASAP- first week of August.    Standing Status:   Future    Standing Expiration Date:   05/18/2024    Order Specific Question:   Full PFT: includes the following: basic spirometry, spirometry pre & post bronchodilator, diffusion capacity (DLCO), lung volumes    Answer:   Full PFT    Will see the patient in follow-up in 3 months time he is to contact us prior to that time should any new problems arise.   Gailen Shelter, MD Advanced Bronchoscopy PCCM [place]  [facility]    *This note was dictated using voice recognition software/Dragon.  Despite best efforts to proofread, errors can occur which can change the meaning. Any transcriptional errors that result  from this process are unintentional and may not be fully corrected at the time of dictation.

## 2023-05-19 NOTE — Patient Instructions (Signed)
We are going to order breathing tests.  Use your Breztri and as needed albuterol (rescue inhaler).  Please be consistent with using your oxygen at nighttime.  Please work on quitting smoking.  We will see you in follow-up in 3 months time call sooner should any new problems arise.

## 2023-05-20 ENCOUNTER — Other Ambulatory Visit: Payer: Self-pay | Admitting: Pulmonary Disease

## 2023-06-07 ENCOUNTER — Telehealth: Payer: Self-pay | Admitting: *Deleted

## 2023-06-07 NOTE — Telephone Encounter (Signed)
RN returned call to pt who states he is getting IV antibiotics through his port a cath at home everyday and [facility] home care is managing and flushing daily after infusion. Pt request appt for port flush be canceled for 06/14/23.

## 2023-06-14 ENCOUNTER — Telehealth: Payer: Self-pay | Admitting: *Deleted

## 2023-06-14 ENCOUNTER — Inpatient Hospital Stay: Payer: Medicare Other

## 2023-06-14 ENCOUNTER — Other Ambulatory Visit: Payer: Self-pay | Admitting: *Deleted

## 2023-06-14 NOTE — Telephone Encounter (Signed)
Patient called requesting Dr Orlie Dakin to order some lidocaine spray(something that works immediately) for his port access. Home health is changing put his needle once a week and it has a big dressing on with the needle in it so he cannot put to EMLA cream on it and the nurse cannot stay for an hour to wait for the cream to take effect. He states that it is painful to have it accessed. Please advise

## 2023-06-14 NOTE — Telephone Encounter (Signed)
I called and spoke with pharmacist at [facility] who also checked with another pharmacist on duty then I checked with our in house pharmacist, then with Dr Orlie Dakin and it is OK to use over the counter Aspercreme Lidocaine Pain Relief Dry Spray. Patient informed of this and that it is over the counter and he will go get it

## 2023-06-17 LAB — LAB REPORT - SCANNED: EGFR: 106

## 2023-07-01 ENCOUNTER — Other Ambulatory Visit: Payer: Self-pay | Admitting: Pulmonary Disease

## 2023-07-01 DIAGNOSIS — A310 Pulmonary mycobacterial infection: Secondary | ICD-10-CM

## 2023-07-01 DIAGNOSIS — J984 Other disorders of lung: Secondary | ICD-10-CM

## 2023-07-04 ENCOUNTER — Other Ambulatory Visit: Payer: Self-pay | Admitting: Pulmonary Disease

## 2023-07-08 ENCOUNTER — Ambulatory Visit
Admission: RE | Admit: 2023-07-08 | Discharge: 2023-07-08 | Disposition: A | Discharge status: Home / Self Care | Payer: Medicare Other | Source: Ambulatory Visit | Attending: Pulmonary Disease | Admitting: Pulmonary Disease

## 2023-07-08 DIAGNOSIS — A310 Pulmonary mycobacterial infection: Secondary | ICD-10-CM | POA: Insufficient documentation

## 2023-07-08 DIAGNOSIS — J984 Other disorders of lung: Secondary | ICD-10-CM | POA: Diagnosis present

## 2023-07-09 LAB — LAB REPORT - SCANNED: EGFR: 111

## 2023-07-22 ENCOUNTER — Telehealth: Payer: Self-pay | Admitting: *Deleted

## 2023-07-22 NOTE — Telephone Encounter (Signed)
Patient called reporting that [facility] is stopping his IV antibiotics as there has been no response to treatment. He states that he is scheduled for a CT coming up and is asking if Dr Orlie Dakin needs to change that to a PET scan or even do a biopsy to find out exactly what is going on. Please advise

## 2023-07-22 NOTE — Telephone Encounter (Signed)
Call returned to patient and advised that doctor does not want to do a PET nor a biopsy. He stated "OK"

## 2023-07-26 ENCOUNTER — Inpatient Hospital Stay: Payer: Medicare Other

## 2023-07-28 ENCOUNTER — Ambulatory Visit: Payer: Medicare Other | Admitting: Urology

## 2023-08-03 NOTE — Progress Notes (Unsigned)
08/04/2023  8:53 AM   Paul Carpenter 1956-02-25 [id number]  Referring provider: Barbette Reichmann, MD [address] [facility] [place],  [place] [address]  Urological history: 1. ED -contributing factors of age, BPH, HTN, CAD, hypothyroidism, smoking, COPD,  BP pills,  depression and anxiety -Testosterone (12/2022) 532 -Tadalafil 5 mg daily  2. BPH with LU TS -PSA (12/2022) 1.1 -cysto 08/2020 - moderate bilateral lobe enlargement  -Tadalafil 5 mg daily  3. Nephrolithiasis -5 mm left renal stone on 06/02/2021  Chief Complaint  Patient presents with   Follow-up    HPI: Paul Carpenter is a 67 y.o.  male who presents today for 6 month follow up.  Previous records reviewed.   He is extremely frustrated concerning his pulmonary issues (cancer and MAC).  He is contemplating taking a drug and treatment holiday.  He has follow-ups with his oncologist later this month.  He continues to have issues with pain with erections.  He also states the plaque seems to have extended more towards the base of his penis.  He does not notice a significant curvature with erections.  He is taking the Cialis 5 mg daily when he remembers.   SHIM     Row Name 08/04/23 0830         SHIM: Over the last 6 months:   How do you rate your confidence that you could get and keep an erection? Low     When you had erections with sexual stimulation, how often were your erections hard enough for penetration (entering your partner)? Sometimes (about half the time)     During sexual intercourse, how often were you able to maintain your erection after you had penetrated (entered) your partner? Sometimes (about half the time)     During sexual intercourse, how difficult was it to maintain your erection to completion of intercourse? Difficult     When you attempted sexual intercourse, how often was it satisfactory for you? A Few Times (much less than half the time)       SHIM Total Score    SHIM 13             Score: 1-7 Severe ED 8-11 Moderate ED 12-16 Mild-Moderate ED 17-21 Mild ED 22-25 No ED   He is having urinary frequency and urgency, but he attributes to having to take some sort of fluid with the "30 some medications" he has to take daily.  Patient denies any modifying or aggravating factors.  Patient denies any recent UTI's, gross hematuria, dysuria or suprapubic/flank pain.  Patient denies any fevers, chills, nausea or vomiting.     IPSS     Row Name 08/04/23 0800         International Prostate Symptom Score   How often have you had the sensation of not emptying your bladder? About half the time     How often have you had to urinate less than every two hours? More than half the time     How often have you found you stopped and started again several times when you urinated? Almost always     How often have you found it difficult to postpone urination? Almost always     How often have you had a weak urinary stream? Less than 1 in 5 times     How often have you had to strain to start urination? Less than 1 in 5 times     How many times did you typically  get up at night to urinate? 3 Times     Total IPSS Score 22       Quality of Life due to urinary symptoms   If you were to spend the rest of your life with your urinary condition just the way it is now how would you feel about that? Mixed              Score:  1-7 Mild 8-19 Moderate 20-35 Severe  PMH: Past Medical History:  Diagnosis Date   Anginal pain (HCC)    Anxiety    Asthma    Chest pain    CHF (congestive heart failure) (HCC)    Chicken pox    Complication of anesthesia    o2 dropped after neck fusion   COPD (chronic obstructive pulmonary disease) (HCC)    Coronary artery disease    Cough    chronic  clear phlegm   Dysrhythmia    palpitations   GERD (gastroesophageal reflux disease)    h/o reflux/ hoarsness   Hematochezia    Hemorrhoids    History of chickenpox    History  of colon polyps    History of Helicobacter pylori infection    Hoarseness    Hypertension    Lung cancer (HCC) 05/2016   Chemo + rad tx's.    Migraines    OSA (obstructive sleep apnea)    has CPAP but does not use   Personal history of tobacco use, presenting hazards to health 03/05/2016   Pneumonia    5/17   Raynaud disease    Raynaud disease    Raynaud's disease    Rotator cuff tear    on right   Shortness of breath dyspnea    Sleep apnea    Ulcer (traumatic) of oral mucosa     Surgical History: Past Surgical History:  Procedure Laterality Date   BACK SURGERY     cervical fusion x 2   CARDIAC CATHETERIZATION     CERVICAL DISCECTOMY     x 2   COLONOSCOPY     COLONOSCOPY N/A 07/25/2015   Procedure: COLONOSCOPY;  Surgeon: Christena Deem, MD;  Location: [facility] ENDOSCOPY;  Service: Endoscopy;  Laterality: N/A;   COLONOSCOPY WITH PROPOFOL N/A 10/04/2017   Procedure: COLONOSCOPY WITH PROPOFOL;  Surgeon: Christena Deem, MD;  Location: [facility] ENDOSCOPY;  Service: Endoscopy;  Laterality: N/A;   COLONOSCOPY WITH PROPOFOL N/A 07/10/2020   Procedure: COLONOSCOPY WITH PROPOFOL;  Surgeon: Toledo, Boykin Nearing, MD;  Location: [facility] ENDOSCOPY;  Service: Gastroenterology;  Laterality: N/A;   ELECTROMAGNETIC NAVIGATION BROCHOSCOPY Left 06/28/2016   Procedure: ELECTROMAGNETIC NAVIGATION BRONCHOSCOPY;  Surgeon: Stephanie Acre, MD;  Location: [facility] ORS;  Service: Cardiopulmonary;  Laterality: Left;   ENDOBRONCHIAL ULTRASOUND N/A 04/11/2018   Procedure: ENDOBRONCHIAL ULTRASOUND;  Surgeon: Erin Fulling, MD;  Location: [facility] ORS;  Service: Cardiopulmonary;  Laterality: N/A;   ESOPHAGOGASTRODUODENOSCOPY N/A 07/25/2015   Procedure: ESOPHAGOGASTRODUODENOSCOPY (EGD);  Surgeon: Christena Deem, MD;  Location: [facility] ENDOSCOPY;  Service: Endoscopy;  Laterality: N/A;   ESOPHAGOGASTRODUODENOSCOPY (EGD) WITH PROPOFOL N/A 07/10/2020   Procedure: ESOPHAGOGASTRODUODENOSCOPY (EGD) WITH PROPOFOL;  Surgeon: Toledo, Boykin Nearing, MD;  Location: [facility] ENDOSCOPY;  Service: Gastroenterology;  Laterality: N/A;   NASAL SINUS SURGERY     x 2    PORTA CATH INSERTION N/A 04/24/2018   Procedure: PORTA CATH INSERTION;  Surgeon: Annice Needy, MD;  Location: [facility] INVASIVE CV LAB;  Service: Cardiovascular;  Laterality: N/A;   rotator cuff surgery Right  07/2016   SEPTOPLASTY     SKIN GRAFT      Home Medications:  Allergies as of 08/04/2023       Reactions   Lisinopril Rash   Varenicline Rash        Medication List        Accurate as of August 04, 2023  8:53 AM. If you have any questions, ask your nurse or doctor.          acetaminophen 500 MG tablet Commonly known as: TYLENOL Take 500 mg by mouth daily as needed.   albuterol (2.5 MG/3ML) 0.083% nebulizer solution Commonly known as: PROVENTIL TAKE 3 MLS (2.5 MG TOTAL) BY NEBULIZATION 4 (FOUR) TIMES DAILY AS NEEDED FOR WHEEZING OR SHORTNESS OF BREATH.   albuterol 108 (90 Base) MCG/ACT inhaler Commonly known as: VENTOLIN HFA INHALE 2 PUFFS BY MOUTH EVERY 4 HOURS AS NEEDED FOR WHEEZE OR FOR SHORTNESS OF BREATH   atorvastatin 10 MG tablet Commonly known as: LIPITOR Take 10 mg by mouth daily.   azithromycin 500 MG tablet Commonly known as: ZITHROMAX Take 1 tablet (500 mg total) by mouth daily.   Breztri Aerosphere 160-9-4.8 MCG/ACT Aero Generic drug: Budeson-Glycopyrrol-Formoterol INHALE 2 PUFFS INTO THE LUNGS IN THE MORNING AND AT BEDTIME.   diazepam 5 MG tablet Commonly known as: VALIUM Take 5 mg by mouth at bedtime as needed.   dronabinol 5 MG capsule Commonly known as: MARINOL Take 1 capsule (5 mg total) by mouth 2 (two) times daily before lunch and supper.   ethambutol 400 MG tablet Commonly known as: MYAMBUTOL Take 3 tablets (1,200 mg total) by mouth daily.   ferrous sulfate 325 (65 FE) MG tablet Take 325 mg by mouth at bedtime.   hydrocortisone 10 MG tablet Commonly known as: CORTEF Take two tablets in the morning. Take one tablet  in the afternoon (between 2-4pm).   levothyroxine 200 MCG tablet Commonly known as: SYNTHROID Take 200 mcg by mouth daily before breakfast.   lidocaine-prilocaine cream Commonly known as: EMLA APPLY 1 APPLICATION TOPICALLY AS NEEDED.   magnesium oxide 400 MG tablet Commonly known as: MAG-OX Take 600 mg by mouth 2 (two) times daily. Takes 1.5 tablet twice daily   nitroGLYCERIN 0.4 MG SL tablet Commonly known as: NITROSTAT Place under the tongue.   OXYGEN Inhale 2 L into the lungs at bedtime.   pantoprazole 40 MG tablet Commonly known as: PROTONIX Take 1 tablet (40 mg total) by mouth 2 (two) times daily before meals   rifampin 300 MG capsule Commonly known as: RIFADIN Take 2 capsules (600 mg total) by mouth daily.   sodium chloride 1 g tablet Take 1 tablet (1 g total) by mouth 3 (three) times daily with meals.   tadalafil 5 MG tablet Commonly known as: CIALIS Take 1 tablet (5 mg total) by mouth daily as needed for erectile dysfunction.        Allergies:  Allergies  Allergen Reactions   Lisinopril Rash   Varenicline Rash    Family History: Family History  Problem Relation Age of Onset   Heart disease Father    Prostate cancer Father    Heart disease Paternal Grandmother    Heart attack Maternal Grandfather 62   Kidney cancer Neg Hx    Bladder Cancer Neg Hx    Other Neg Hx        pituitary abnormality    Social History:  reports that he has been smoking cigarettes. He has a 150 pack-year smoking history.  He has been exposed to tobacco smoke. He has never used smokeless tobacco. He reports current alcohol use of about 2.0 standard drinks of alcohol per week. He reports that he does not use drugs.  ROS: For pertinent review of systems please refer to history of present illness  Physical Exam: BP 139/85   Pulse 91   Constitutional:  Well nourished. Alert and oriented, No acute distress. HEENT: [place] AT, moist mucus membranes.  Trachea  midline Cardiovascular: No clubbing, cyanosis, or edema. Respiratory: Normal respiratory effort, no increased work of breathing. GU: No CVA tenderness.  No bladder fullness or masses.  Patient with circumcised phallus.  Urethral meatus is patent.  No penile discharge. No penile lesions or rashes.   Peyronie's plaque extended through the mid shaft towards the base on the dorsal side. Neurologic: Grossly intact, no focal deficits, moving all 4 extremities. Psychiatric: Normal mood and affect.   Laboratory Data: Basic Metabolic Panel (BMP) Order: [id number] Component Ref Range & Units 2 wk ago  Glucose 70 - 110 mg/dL 75  Sodium 784 - 696 mmol/L 134 Low   Potassium 3.6 - 5.1 mmol/L 4.1  Chloride 97 - 109 mmol/L 95 Low   Carbon Dioxide (CO2) 22.0 - 32.0 mmol/L 31.1  Calcium 8.7 - 10.3 mg/dL 9.0  Urea Nitrogen (BUN) 7 - 25 mg/dL 9  Creatinine 0.7 - 1.3 mg/dL 0.6 Low   Glomerular Filtration Rate (eGFR) >60 mL/min/1.73sq m 106  Comment: CKD-EPI (2021) does not include patient's race in the calculation of eGFR.  Monitoring changes of plasma creatinine and eGFR over time is useful for monitoring kidney function.  Interpretive Ranges for eGFR (CKD-EPI 2021):  eGFR:       >60 mL/min/1.73 sq. m - Normal eGFR:       30-59 mL/min/1.73 sq. m - Moderately Decreased eGFR:       15-29 mL/min/1.73 sq. m  - Severely Decreased eGFR:       < 15 mL/min/1.73 sq. m  - Kidney Failure   Note: These eGFR calculations do not apply in acute situations when eGFR is changing rapidly or patients on dialysis.  BUN/Crea Ratio 6.0 - 20.0 15.0  Anion Gap w/K 6.0 - 16.0 12.0  Resulting Agency [facility]   Specimen Collected: 07/18/23 08:22   Performed by: [facility] Last Resulted: 07/18/23 13:15  Received From: [facility]  Result Received: 07/19/23 15:58   Urinalysis w/Microscopic Order: [id number] Component Ref Range & Units 2 mo ago   Color Colorless, Straw, Light Yellow, Yellow, Dark Yellow Light Yellow  Clarity Clear Clear  Specific Gravity 1.005 - 1.030 1.008  pH, Urine 5.0 - 8.0 7.0  Protein, Urinalysis Negative mg/dL Negative  Glucose, Urinalysis Negative mg/dL Negative  Ketones, Urinalysis Negative mg/dL Negative  Blood, Urinalysis Negative Negative  Nitrite, Urinalysis Negative Negative  Leukocyte Esterase, Urinalysis Negative Negative  Bilirubin, Urinalysis Negative Negative  Urobilinogen, Urinalysis 0.2 - 1.0 mg/dL 0.2  WBC, UA <=5 /hpf <1  Red Blood Cells, Urinalysis <=3 /hpf 1  Bacteria, Urinalysis 0 - 5 /hpf 0-5  Squamous Epithelial Cells, Urinalysis /hpf 0  Resulting Agency [facility]   Specimen Collected: 05/25/23 07:38   Performed by: [facility] Last Resulted: 05/25/23 09:50  Received From: [facility]  Result Received: 06/07/23 09:47   Received Information Urinalysis w/Microscopic (Order [id number])   I have reviewed the labs.   Pertinent Imaging N/A  Assessment &  Plan:    1. BPH with LUTS -PSA normal  -most bothersome symptoms are hesitancy -continue conservative management, avoiding bladder irritants and timed voiding's -continue Cialis 5 mg daily   2. Erectile dysfunction -Testosterone level normal -continue Cialis 5 mg daily, explained that he can augment up to 20 mg when he wants to have intercourse  3.  Peyronie's disease -Discussed Xiaflex treatment, but he states he does not want to pursue -Offered referral for possible surgical correction, he deferred  Return in about 1 year (around 08/03/2024) for I PSS, SHIM .  Cloretta Ned  [facility] [address], [address] [place], [place] [address] [phone]

## 2023-08-04 ENCOUNTER — Encounter: Payer: Self-pay | Admitting: Urology

## 2023-08-04 ENCOUNTER — Ambulatory Visit: Payer: Medicare Other | Admitting: Urology

## 2023-08-04 VITALS — BP 139/85 | HR 91

## 2023-08-04 DIAGNOSIS — N401 Enlarged prostate with lower urinary tract symptoms: Secondary | ICD-10-CM

## 2023-08-04 DIAGNOSIS — N529 Male erectile dysfunction, unspecified: Secondary | ICD-10-CM | POA: Diagnosis not present

## 2023-08-04 DIAGNOSIS — N486 Induration penis plastica: Secondary | ICD-10-CM

## 2023-08-10 ENCOUNTER — Ambulatory Visit
Admission: RE | Admit: 2023-08-10 | Discharge: 2023-08-10 | Disposition: A | Discharge status: Home / Self Care | Payer: Medicare Other | Source: Ambulatory Visit | Attending: Oncology | Admitting: Oncology

## 2023-08-10 ENCOUNTER — Inpatient Hospital Stay: Payer: Medicare Other | Attending: Oncology

## 2023-08-10 ENCOUNTER — Other Ambulatory Visit: Payer: Self-pay | Admitting: *Deleted

## 2023-08-10 DIAGNOSIS — C342 Malignant neoplasm of middle lobe, bronchus or lung: Secondary | ICD-10-CM | POA: Insufficient documentation

## 2023-08-10 DIAGNOSIS — Z452 Encounter for adjustment and management of vascular access device: Secondary | ICD-10-CM | POA: Insufficient documentation

## 2023-08-10 DIAGNOSIS — C3491 Malignant neoplasm of unspecified part of right bronchus or lung: Secondary | ICD-10-CM | POA: Diagnosis present

## 2023-08-10 DIAGNOSIS — Z95828 Presence of other vascular implants and grafts: Secondary | ICD-10-CM

## 2023-08-10 LAB — CBC WITH DIFFERENTIAL/PLATELET
Abs Immature Granulocytes: 0.03 10*3/uL (ref 0.00–0.07)
Basophils Absolute: 0 10*3/uL (ref 0.0–0.1)
Basophils Relative: 0 %
Eosinophils Absolute: 0.1 10*3/uL (ref 0.0–0.5)
Eosinophils Relative: 1 %
HCT: 39 % (ref 39.0–52.0)
Hemoglobin: 13.5 g/dL (ref 13.0–17.0)
Immature Granulocytes: 0 %
Lymphocytes Relative: 7 %
Lymphs Abs: 0.5 10*3/uL — ABNORMAL LOW (ref 0.7–4.0)
MCH: 32.7 pg (ref 26.0–34.0)
MCHC: 34.6 g/dL (ref 30.0–36.0)
MCV: 94.4 fL (ref 80.0–100.0)
Monocytes Absolute: 0.6 10*3/uL (ref 0.1–1.0)
Monocytes Relative: 8 %
Neutro Abs: 5.7 10*3/uL (ref 1.7–7.7)
Neutrophils Relative %: 84 %
Platelets: 210 10*3/uL (ref 150–400)
RBC: 4.13 MIL/uL — ABNORMAL LOW (ref 4.22–5.81)
RDW: 12.7 % (ref 11.5–15.5)
WBC: 6.8 10*3/uL (ref 4.0–10.5)
nRBC: 0 % (ref 0.0–0.2)

## 2023-08-10 LAB — CMP (CANCER CENTER ONLY)
ALT: 15 U/L (ref 0–44)
AST: 21 U/L (ref 15–41)
Albumin: 3.9 g/dL (ref 3.5–5.0)
Alkaline Phosphatase: 77 U/L (ref 38–126)
Anion gap: 6 (ref 5–15)
BUN: 7 mg/dL — ABNORMAL LOW (ref 8–23)
CO2: 27 mmol/L (ref 22–32)
Calcium: 8.7 mg/dL — ABNORMAL LOW (ref 8.9–10.3)
Chloride: 97 mmol/L — ABNORMAL LOW (ref 98–111)
Creatinine: 0.61 mg/dL (ref 0.61–1.24)
GFR, Estimated: >60 mL/min (ref >60)
Glucose, Bld: 104 mg/dL — ABNORMAL HIGH (ref 70–99)
Potassium: 3.5 mmol/L (ref 3.5–5.1)
Sodium: 130 mmol/L — ABNORMAL LOW (ref 135–145)
Total Bilirubin: 0.6 mg/dL (ref 0.3–1.2)
Total Protein: 6.6 g/dL (ref 6.5–8.1)

## 2023-08-10 MED ORDER — SODIUM CHLORIDE 0.9% FLUSH
10.0000 mL | Freq: Once | INTRAVENOUS | Status: AC
  Administered 2023-08-10: 10 mL via INTRAVENOUS
  Filled 2023-08-10: qty 10

## 2023-08-10 MED ORDER — HEPARIN SOD (PORK) LOCK FLUSH 100 UNIT/ML IV SOLN
500.0000 [IU] | Freq: Once | INTRAVENOUS | Status: AC
  Filled 2023-08-10: qty 5

## 2023-08-10 MED ORDER — IOHEXOL 300 MG/ML  SOLN
75.0000 mL | Freq: Once | INTRAMUSCULAR | Status: AC | PRN
  Administered 2023-08-10: 75 mL via INTRAVENOUS

## 2023-08-10 MED ORDER — HEPARIN SOD (PORK) LOCK FLUSH 100 UNIT/ML IV SOLN
500.0000 [IU] | Freq: Once | INTRAVENOUS | Status: AC
  Administered 2023-08-10: 500 [IU] via INTRAVENOUS

## 2023-08-15 ENCOUNTER — Telehealth: Payer: Self-pay

## 2023-08-15 NOTE — Telephone Encounter (Signed)
Per MD patient will need to contact ordering physician regarding CT results. Requested he follow up with [facility] to discuss results and treatment since they are managing his care.  Juanita Laster, RMA

## 2023-08-15 NOTE — Telephone Encounter (Signed)
Patient left voicemail requesting direct call back from Dr. Rivka Safer regarding recent CT from 10/9. States he was working with [facility] and Dr. Doralee Albino with pulmonary. States that he received Amikacin infusion and ten weeks later was told its not working. Has stopped all antibiotics until he hears back from MD.  Juanita Laster, RMA

## 2023-08-16 ENCOUNTER — Encounter: Payer: Self-pay | Admitting: Oncology

## 2023-08-16 ENCOUNTER — Inpatient Hospital Stay (HOSPITAL_BASED_OUTPATIENT_CLINIC_OR_DEPARTMENT_OTHER): Payer: Medicare Other | Admitting: Oncology

## 2023-08-16 VITALS — BP 142/77 | HR 96 | Temp 97.0°F | Resp 18 | Ht 72.0 in | Wt 177.3 lb

## 2023-08-16 DIAGNOSIS — C3491 Malignant neoplasm of unspecified part of right bronchus or lung: Secondary | ICD-10-CM

## 2023-08-16 DIAGNOSIS — Z452 Encounter for adjustment and management of vascular access device: Secondary | ICD-10-CM | POA: Diagnosis not present

## 2023-08-16 NOTE — Progress Notes (Signed)
[facility]  [phone] [phone]  ID: [name] OB: [birth year]  MR#: [id number]  AOZ#:[id number]  Patient Care Team: Barbette Reichmann, MD as PCP - General (Internal Medicine) Wyn Quaker Marlow Baars, MD as Referring Physician (Vascular Surgery) Jeralyn Ruths, MD as Consulting Physician (Oncology) Carmina Miller, MD as Referring Physician (Radiation Oncology) Salena Saner, MD as Consulting Physician (Pulmonary Disease) Lynn Ito, MD as Consulting Physician (Infectious Diseases)   CHIEF COMPLAINT: Recurrent stage IIIa non-small cell lung cancer, right middle lobe.  INTERVAL HISTORY: Patient returns to clinic today for routine 68-month evaluation and discussion of his imaging results.  He continues to have chronic cough and shortness of breath.  He uses supplemental oxygen intermittently.  He has chronic weakness and fatigue.  He continues to smoke and drink alcohol.  He denies any chest pain or hemoptysis.  He has no neurologic complaints.  He denies any fevers. He denies any nausea, vomiting, constipation, or diarrhea. He has no urinary complaints.  Patient offers no further specific complaints today.  REVIEW OF SYSTEMS:   Review of Systems  Constitutional:  Positive for malaise/fatigue. Negative for fever and weight loss.  HENT: Negative.  Negative for congestion and sore throat.   Respiratory:  Positive for cough and shortness of breath. Negative for hemoptysis.   Cardiovascular: Negative.  Negative for chest pain and leg swelling.  Gastrointestinal: Negative.  Negative for abdominal pain, blood in stool, melena and nausea.  Genitourinary: Negative.  Negative for dysuria.  Musculoskeletal: Negative.  Negative for joint pain and neck pain.  Skin: Negative.  Negative for rash.  Neurological:  Positive for weakness. Negative for dizziness, sensory change, focal weakness and headaches.  Psychiatric/Behavioral:  Positive for  depression. The patient is not nervous/anxious.     As per HPI. Otherwise, a complete review of systems is negative.  PAST MEDICAL HISTORY: Past Medical History:  Diagnosis Date   Anginal pain (HCC)    Anxiety    Asthma    Chest pain    CHF (congestive heart failure) (HCC)    Chicken pox    Complication of anesthesia    o2 dropped after neck fusion   COPD (chronic obstructive pulmonary disease) (HCC)    Coronary artery disease    Cough    chronic  clear phlegm   Dysrhythmia    palpitations   GERD (gastroesophageal reflux disease)    h/o reflux/ hoarsness   Hematochezia    Hemorrhoids    History of chickenpox    History of colon polyps    History of Helicobacter pylori infection    Hoarseness    Hypertension    Lung cancer (HCC) 05/2016   Chemo + rad tx's.    Migraines    OSA (obstructive sleep apnea)    has CPAP but does not use   Personal history of tobacco use, presenting hazards to health 03/05/2016   Pneumonia    5/17   Raynaud disease    Raynaud disease    Raynaud's disease    Rotator cuff tear    on right   Shortness of breath dyspnea    Sleep apnea    Ulcer (traumatic) of oral mucosa     PAST SURGICAL HISTORY: Past Surgical History:  Procedure Laterality Date   BACK SURGERY     cervical fusion x 2   CARDIAC CATHETERIZATION     CERVICAL DISCECTOMY     x 2   COLONOSCOPY     COLONOSCOPY N/A  07/25/2015   Procedure: COLONOSCOPY;  Surgeon: Christena Deem, MD;  Location: [facility] ENDOSCOPY;  Service: Endoscopy;  Laterality: N/A;   COLONOSCOPY WITH PROPOFOL N/A 10/04/2017   Procedure: COLONOSCOPY WITH PROPOFOL;  Surgeon: Christena Deem, MD;  Location: [facility] ENDOSCOPY;  Service: Endoscopy;  Laterality: N/A;   COLONOSCOPY WITH PROPOFOL N/A 07/10/2020   Procedure: COLONOSCOPY WITH PROPOFOL;  Surgeon: Toledo, Boykin Nearing, MD;  Location: [facility] ENDOSCOPY;  Service: Gastroenterology;  Laterality: N/A;   ELECTROMAGNETIC NAVIGATION BROCHOSCOPY Left 06/28/2016    Procedure: ELECTROMAGNETIC NAVIGATION BRONCHOSCOPY;  Surgeon: Stephanie Acre, MD;  Location: [facility] ORS;  Service: Cardiopulmonary;  Laterality: Left;   ENDOBRONCHIAL ULTRASOUND N/A 04/11/2018   Procedure: ENDOBRONCHIAL ULTRASOUND;  Surgeon: Erin Fulling, MD;  Location: [facility] ORS;  Service: Cardiopulmonary;  Laterality: N/A;   ESOPHAGOGASTRODUODENOSCOPY N/A 07/25/2015   Procedure: ESOPHAGOGASTRODUODENOSCOPY (EGD);  Surgeon: Christena Deem, MD;  Location: [facility] ENDOSCOPY;  Service: Endoscopy;  Laterality: N/A;   ESOPHAGOGASTRODUODENOSCOPY (EGD) WITH PROPOFOL N/A 07/10/2020   Procedure: ESOPHAGOGASTRODUODENOSCOPY (EGD) WITH PROPOFOL;  Surgeon: Toledo, Boykin Nearing, MD;  Location: [facility] ENDOSCOPY;  Service: Gastroenterology;  Laterality: N/A;   NASAL SINUS SURGERY     x 2    PORTA CATH INSERTION N/A 04/24/2018   Procedure: PORTA CATH INSERTION;  Surgeon: Annice Needy, MD;  Location: [facility] INVASIVE CV LAB;  Service: Cardiovascular;  Laterality: N/A;   rotator cuff surgery Right    07/2016   SEPTOPLASTY     SKIN GRAFT      FAMILY HISTORY Family History  Problem Relation Age of Onset   Heart disease Father    Prostate cancer Father    Heart disease Paternal Grandmother    Heart attack Maternal Grandfather 26   Kidney cancer Neg Hx    Bladder Cancer Neg Hx    Other Neg Hx        pituitary abnormality       ADVANCED DIRECTIVES:    HEALTH MAINTENANCE: Social History   Tobacco Use   Smoking status: Every Day    Current packs/day: 3.00    Average packs/day: 3.0 packs/day for 50.0 years (150.0 ttl pk-yrs)    Types: Cigarettes    Passive exposure: Current   Smokeless tobacco: Never   Tobacco comments:    2 PPD 05/19/2023  Vaping Use   Vaping status: Never Used  Substance Use Topics   Alcohol use: Yes    Alcohol/week: 2.0 standard drinks of alcohol    Types: 2 Standard drinks or equivalent per week    Comment: moderate   Drug use: No     Allergies  Allergen Reactions   Lisinopril  Rash   Varenicline Rash    Current Outpatient Medications  Medication Sig Dispense Refill   acetaminophen (TYLENOL) 500 MG tablet Take 500 mg by mouth daily as needed.     albuterol (PROVENTIL) (2.5 MG/3ML) 0.083% nebulizer solution TAKE 3 MLS (2.5 MG TOTAL) BY NEBULIZATION 4 (FOUR) TIMES DAILY AS NEEDED FOR WHEEZING OR SHORTNESS OF BREATH. 90 mL 2   albuterol (VENTOLIN HFA) 108 (90 Base) MCG/ACT inhaler INHALE 2 PUFFS BY MOUTH EVERY 4 HOURS AS NEEDED FOR WHEEZE OR FOR SHORTNESS OF BREATH 6.7 each 2   atorvastatin (LIPITOR) 10 MG tablet Take 10 mg by mouth daily.     BREZTRI AEROSPHERE 160-9-4.8 MCG/ACT AERO INHALE 2 PUFFS INTO THE LUNGS IN THE MORNING AND AT BEDTIME. 10.7 each 11   diazepam (VALIUM) 5 MG tablet Take 5 mg by mouth at bedtime as needed.  ferrous sulfate 325 (65 FE) MG tablet Take 325 mg by mouth at bedtime.     hydrocortisone (CORTEF) 10 MG tablet Take two tablets in the morning. Take one tablet in the afternoon (between 2-4pm).     levothyroxine (SYNTHROID) 200 MCG tablet Take 200 mcg by mouth daily before breakfast.     lidocaine-prilocaine (EMLA) cream APPLY 1 APPLICATION TOPICALLY AS NEEDED. 30 g 1   losartan (COZAAR) 25 MG tablet Take 25 mg by mouth daily.     magnesium oxide (MAG-OX) 400 MG tablet Take 600 mg by mouth 2 (two) times daily. Takes 1.5 tablet twice daily     metoprolol succinate (TOPROL-XL) 25 MG 24 hr tablet Take 25 mg by mouth daily.     nicotine (NICODERM CQ - DOSED IN MG/24 HOURS) 21 mg/24hr patch Place 21 mg onto the skin daily.     nitroGLYCERIN (NITROSTAT) 0.4 MG SL tablet Place under the tongue.     OXYGEN Inhale 2 L into the lungs at bedtime.     pantoprazole (PROTONIX) 40 MG tablet Take 1 tablet (40 mg total) by mouth 2 (two) times daily before meals     sodium chloride 1 g tablet Take 1 tablet (1 g total) by mouth 3 (three) times daily with meals. 90 tablet 1   tadalafil (CIALIS) 5 MG tablet Take 1 tablet (5 mg total) by mouth daily as needed  for erectile dysfunction. 90 tablet 3   azithromycin (ZITHROMAX) 500 MG tablet Take 1 tablet (500 mg total) by mouth daily. (Patient not taking: Reported on 08/16/2023) 30 tablet 6   dronabinol (MARINOL) 5 MG capsule Take 1 capsule (5 mg total) by mouth 2 (two) times daily before lunch and supper. (Patient not taking: Reported on 08/16/2023) 60 capsule 0   ethambutol (MYAMBUTOL) 400 MG tablet Take 3 tablets (1,200 mg total) by mouth daily. (Patient not taking: Reported on 08/16/2023) 90 tablet 6   rifampin (RIFADIN) 300 MG capsule Take 2 capsules (600 mg total) by mouth daily. (Patient not taking: Reported on 08/16/2023) 60 capsule 6   No current facility-administered medications for this visit.   Facility-Administered Medications Ordered in Other Visits  Medication Dose Route Frequency Provider Last Rate Last Admin   heparin lock flush 100 unit/mL  500 Units Intravenous Once Jeralyn Ruths, MD        OBJECTIVE: Vitals:   08/16/23 1016  BP: (!) 142/77  Pulse: 96  Resp: 18  Temp: (!) 97 F (36.1 C)  SpO2: 100%      Body mass index is 24.05 kg/m.    ECOG FS:2 - Symptomatic, <50% confined to bed  General: Well-developed, well-nourished, no acute distress. Eyes: Pink conjunctiva, anicteric sclera. HEENT: Normocephalic, moist mucous membranes. Lungs: No wheezing.  Positive cough. Heart: Regular rate and rhythm. Abdomen: Soft, nontender, no obvious distention. Musculoskeletal: No edema, cyanosis, or clubbing. Neuro: Alert, answering all questions appropriately. Cranial nerves grossly intact. Skin: No rashes or petechiae noted. Psych: Normal affect.  LAB RESULTS:  Lab Results  Component Value Date   NA 130 (L) 08/10/2023   K 3.5 08/10/2023   CL 97 (L) 08/10/2023   CO2 27 08/10/2023   GLUCOSE 104 (H) 08/10/2023   BUN 7 (L) 08/10/2023   CREATININE 0.61 08/10/2023   CALCIUM 8.7 (L) 08/10/2023   PROT 6.6 08/10/2023   ALBUMIN 3.9 08/10/2023   AST 21 08/10/2023   ALT 15  08/10/2023   ALKPHOS 77 08/10/2023   BILITOT 0.6 08/10/2023  GFRNONAA >60 08/10/2023   GFRAA >60 04/24/2020    Lab Results  Component Value Date   WBC 6.8 08/10/2023   NEUTROABS 5.7 08/10/2023   HGB 13.5 08/10/2023   HCT 39.0 08/10/2023   MCV 94.4 08/10/2023   PLT 210 08/10/2023   Lab Results  Component Value Date   IRON 49 12/28/2021   TIBC 241 (L) 12/28/2021   IRONPCTSAT 20 12/28/2021   Lab Results  Component Value Date   FERRITIN 318 11/20/2021     STUDIES:  ONCOLOGY HISTORY: Case discussed with pathologist and unable to determine whether this is adenocarcinoma or squamous cell carcinoma.  There is also insufficient tissue to do further testing.  Liquid biopsy did not reveal any actionable mutations.  MRI of the brain completed on Mar 28, 2018 reviewed independently did not reveal metastatic disease.  Patient completed XRT June 26, 2018.  He completed his concurrent single agent carboplatinum on June 21, 2018.  Patient had a reaction to Taxol during cycle 1 and this was discontinued.  He completed a year-long of maintenance durvalumab on June 27, 2019.   PET scan results from November 16, 2021 reviewed independently with once again suspicion of progression of disease.  After lengthy discussion with the patient, it was agreed that no biopsy is necessary and may be too difficult given his recent C2 cervical fracture.  Patient and family expressed interest in systemic chemotherapy.  Patient received his fourth and final cycle of Taxotere on January 28, 2022.    ASSESSMENT: Recurrent stage IIIa non-small cell lung cancer, right middle lobe.  PLAN:    Recurrent stage IIIa non-small cell lung cancer, right middle lobe: See oncology history above.  Patients most recent imaging with CT scan on August 10, 2023 reviewed independently with essentially unchanged right upper lobe cavitary lesion.  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  Patient does not require a PET scan.  No treatments are planned.  Continue follow-up with pulmonary and infectious disease as scheduled.  Return to clinic in 6 months with repeat imaging and further evaluation.  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  Follow-up with ID as scheduled. Iron deficiency anemia: Resolved.  He last received IV Venofer on December 03, 2021.   Colon polyps:  Patient has a personal history of greater then 10 adenomatous polyps on his most recent conoloscopy. He does not know of any family history of increased polyps or colon cancer.  Genetic testing to assess for the APC mutation for FAP or AFAP was negative. Continue colonoscopies as per GI. Tobacco Use: Chronic and unchanged.  Patient continues to smoke on a daily basis.  He previously expressed understanding by continuing tobacco use increases his chance of recurrence. Anxiety/depression: Chronic and unchanged.  Continue Valium 10 mg every 12 hours as needed.  Have recommended reinitiating antidepressant, but patient is hesitant to do so stating worries of his sodium level.   Hyponatremia: Sodium improved to 130.  Continue salt tablets as prescribed by nephrology.  Patient was previously counseled on alcohol consumption and how it can act as a diuretic and reduce sodium levels.  Follow-up with nephrology as scheduled. Hypothyroidism: Appreciate endocrinology input.  Continue Synthroid as prescribed.  Follow-up with endocrinology as scheduled. CHF: Patient's most recent cardiac echo on November 10, 2022 revealed an EF of 37% which is slightly decreased from previous.  Continue follow-up with cardiology.   History of C2 cervical fracture: Patient had  repeat imaging on May 03, 2022 that revealed complete resolution of fracture.   Cough/shortness of breath: Chronic and unchanged.  Continue oxygen as needed.  Patient also continues to smoke.  Follow-up with pulmonary as needed.  I spent a  total of 30 minutes reviewing chart data, face-to-face evaluation with the patient, counseling and coordination of care as detailed above.  Patient expressed understanding and was in agreement with this plan. He also understands that He can call clinic at any time with any questions, concerns, or complaints.    Jeralyn Ruths, MD   08/16/2023 11:35 AM

## 2023-08-29 ENCOUNTER — Encounter: Payer: Self-pay | Admitting: Pulmonary Disease

## 2023-08-29 ENCOUNTER — Ambulatory Visit (INDEPENDENT_AMBULATORY_CARE_PROVIDER_SITE_OTHER): Payer: Medicare Other | Admitting: Pulmonary Disease

## 2023-08-29 VITALS — BP 128/82 | HR 92 | Temp 99.1°F | Ht 72.0 in | Wt 177.2 lb

## 2023-08-29 DIAGNOSIS — F17210 Nicotine dependence, cigarettes, uncomplicated: Secondary | ICD-10-CM

## 2023-08-29 DIAGNOSIS — J449 Chronic obstructive pulmonary disease, unspecified: Secondary | ICD-10-CM

## 2023-08-29 DIAGNOSIS — A310 Pulmonary mycobacterial infection: Secondary | ICD-10-CM | POA: Diagnosis not present

## 2023-08-29 DIAGNOSIS — J439 Emphysema, unspecified: Secondary | ICD-10-CM | POA: Diagnosis not present

## 2023-08-29 DIAGNOSIS — I429 Cardiomyopathy, unspecified: Secondary | ICD-10-CM | POA: Diagnosis not present

## 2023-08-29 DIAGNOSIS — R042 Hemoptysis: Secondary | ICD-10-CM

## 2023-08-29 DIAGNOSIS — G4736 Sleep related hypoventilation in conditions classified elsewhere: Secondary | ICD-10-CM

## 2023-08-29 NOTE — Patient Instructions (Signed)
VISIT SUMMARY:  During today's visit, we discussed your worsening respiratory symptoms, including increased coughing, chest discomfort, and sputum production. We also reviewed your history of mycobacterium infection, COPD, and long-term smoking. We talked about your current medications and the possibility of restarting some of them due to your symptoms.  YOUR PLAN:  -MYCOBACTERIUM LUNG INFECTION: A mycobacterium lung infection is a bacterial infection that affects the lungs, causing symptoms like coughing and sputum production. We will collect a sputum culture for analysis and contact your specialist to discuss restarting azithromycin and ethambutol due to your increased coughing since stopping these medications. Inhaled amikacin may also be considered.  -COPD: COPD is a condition where your airways narrow and swell, causing difficulty in breathing. You should continue with your current COPD management plan, including using your albuterol inhaler and Breztri inhaler as needed.  -TOBACCO USE: Long-term smoking can severely affect your lung health. We encourage you to continue your efforts to quit smoking. You should use nicotine patches for 16-18 hours a day and remove them at bedtime to improve your sleep quality.  -GENERAL HEALTH MAINTENANCE: Continue to monitor your thyroid issue through follow up with Dr. Marcello Fennel.  INSTRUCTIONS:  Please collect a sputum sample for analysis as soon as possible. Please contact your specialist at [facility] to discuss the possibility of restarting azithromycin and ethambutol. Continue using your COPD medications as prescribed. Use nicotine patches for 16-18 hours a day, removing them at bedtime. Follow up with your specialist next month as planned.  Will see you in follow-up in 4 to 6 weeks time call sooner should any problems arise.

## 2023-08-29 NOTE — Progress Notes (Signed)
Subjective:    Patient ID: Paul Carpenter, male    DOB: [birth year], 67 y.o.   MRN: [id number]  Patient Care Team: Barbette Reichmann, MD as PCP - General (Internal Medicine) Wyn Quaker Marlow Baars, MD as Referring Physician (Vascular Surgery) Jeralyn Ruths, MD as Consulting Physician (Oncology) Carmina Miller, MD as Referring Physician (Radiation Oncology) Salena Saner, MD as Consulting Physician (Pulmonary Disease) Lynn Ito, MD as Consulting Physician (Infectious Diseases)  Chief Complaint  Patient presents with   Follow-up    SOB. Wheezing. Cough with green/clear and now streaks of blood.    BACKGROUND/INTERVAL:Paul Carpenter is a very complex 67 year old current smoker (2 PPD, 150 PY) with a history as noted below, who presents for follow-up of a cavitary right upper lobe process in the setting of history of non-small cell carcinoma of the lung, Mycobacterium avium complex infection, stage II COPD and chronic dyspnea/fatigue.  This is a scheduled visit.  He was last seen 19 May 2023, he is also followed at [facility] at the pulmonary specialty clinic for management of his MAC.  He continues to smoke 2 packs of cigarettes per day.  HPI Discussed the use of AI scribe software for clinical note transcription with the patient, who gave verbal consent to proceed.  History of Present Illness   The patient, with a history of mycobacterium infection, lung cancer and COPD, presents with worsening respiratory symptoms. He reports a significant increase in coughing since discontinuation of amikacin, azithromycin, and ethambutol. The patient also describes a sensation of "something eating me from the inside out," with stinging and burning in the chest area. He has been producing sputum that is sometimes green, clear, and recently streaked with blood. The patient also reports episodes of panic and difficulty breathing, particularly upon waking, which are only partially relieved by albuterol  inhaler use.  The patient has a history of cancer and received therapy that compromised his immune system, likely leading to a flare-up of the mycobacterium infection. He has been a smoker for over fifty years and has attempted to quit using various methods, including nicotine patches and vaping, with limited success.   The patient was previously on a regimen of amikacin, azithromycin, and ethambutol for the mycobacterium infection, but these were discontinued due to perceived lack of efficacy. The patient is considering restarting these medications due to the worsening of his cough since discontinuation. He is also considering the use of inhaled amikacin, as recommended by [facility] clinic.     Review of Systems A 10 point review of systems was performed and it is as noted above otherwise negative.   Patient Active Problem List   Diagnosis Date Noted   Hyponatremia 09/11/2022   Generalized weakness 09/10/2022   Shortness of breath 02/03/2022   SIADH (syndrome of inappropriate ADH production) (HCC) 09/22/2021   Overweight (BMI 25.0-29.9) 09/22/2021   Hypomagnesemia 09/21/2021   Elevated hemoglobin A1c 06/19/2020   Hypothyroidism, acquired 05/30/2019   Squamous cell lung cancer, right (HCC) 05/30/2019   HFrEF (heart failure with reduced ejection fraction) (HCC) 03/06/2019   Atherosclerosis 12/14/2018   Non-small cell lung cancer, right (HCC) 04/24/2018   Oral ulcer 02/16/2018   Pituitary disorder (HCC) 02/16/2018   Migraine headache 03/07/2017   HCAP (healthcare-associated pneumonia) 09/11/2016   COPD exacerbation (HCC) 09/11/2016   Chronic hyponatremia 09/11/2016   Leukocytosis 09/11/2016   Thrombocytopenia (HCC) 09/11/2016   Cigarette smoker 06/11/2016   Cervical radiculopathy 04/15/2016   Cervical disc disorder at C5-C6 level with radiculopathy 03/09/2016  Impingement syndrome of right shoulder 03/09/2016   Health care maintenance 09/29/2015   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   Benign  essential hypertension 05/28/2015   Polycythemia 03/24/2015   Carotid artery disease (HCC) 12/12/2014   Disequilibrium 12/12/2014   Mixed hyperlipidemia 12/10/2014   Incomplete emptying of bladder 06/04/2014   Anxiety 05/18/2014   Chronic coronary artery disease 05/18/2014   Chronic headaches 05/18/2014   Acute shoulder pain 03/15/2014   Impingement syndrome of left shoulder 03/15/2014   Lung mass 12/06/2013   Kidney stone 11/24/2013   COPD (chronic obstructive pulmonary disease) (HCC) 04/24/2013   Obstructive sleep apnea 04/24/2013   Benign localized prostatic hyperplasia with lower urinary tract symptoms (LUTS) 07/04/2012   Encounter for long-term current use of medication 07/04/2012   ED (erectile dysfunction) of organic origin 07/04/2012   Testicular hypofunction 07/04/2012    Social History   Tobacco Use   Smoking status: Every Day    Current packs/day: 3.00    Average packs/day: 3.0 packs/day for 50.0 years (150.0 ttl pk-yrs)    Types: Cigarettes    Passive exposure: Current   Smokeless tobacco: Never   Tobacco comments:    2 PPD 08/29/2023  Substance Use Topics   Alcohol use: Yes    Alcohol/week: 2.0 standard drinks of alcohol    Types: 2 Standard drinks or equivalent per week    Comment: moderate    Allergies  Allergen Reactions   Lisinopril Rash   Varenicline Rash    Current Meds  Medication Sig   acetaminophen (TYLENOL) 500 MG tablet Take 500 mg by mouth daily as needed.   albuterol (PROVENTIL) (2.5 MG/3ML) 0.083% nebulizer solution TAKE 3 MLS (2.5 MG TOTAL) BY NEBULIZATION 4 (FOUR) TIMES DAILY AS NEEDED FOR WHEEZING OR SHORTNESS OF BREATH.   albuterol (VENTOLIN HFA) 108 (90 Base) MCG/ACT inhaler INHALE 2 PUFFS BY MOUTH EVERY 4 HOURS AS NEEDED FOR WHEEZE OR FOR SHORTNESS OF BREATH   atorvastatin (LIPITOR) 10 MG tablet Take 10 mg by mouth daily.   BREZTRI AEROSPHERE 160-9-4.8 MCG/ACT AERO INHALE 2 PUFFS INTO THE LUNGS IN THE MORNING AND AT BEDTIME.    diazepam (VALIUM) 5 MG tablet Take 5 mg by mouth at bedtime as needed.   ferrous sulfate 325 (65 FE) MG tablet Take 325 mg by mouth at bedtime.   hydrocortisone (CORTEF) 10 MG tablet Take two tablets in the morning. Take one tablet in the afternoon (between 2-4pm).   levothyroxine (SYNTHROID) 200 MCG tablet Take 200 mcg by mouth daily before breakfast.   lidocaine-prilocaine (EMLA) cream APPLY 1 APPLICATION TOPICALLY AS NEEDED.   losartan (COZAAR) 25 MG tablet Take 25 mg by mouth daily.   magnesium oxide (MAG-OX) 400 MG tablet Take 600 mg by mouth 2 (two) times daily. Takes 1.5 tablet twice daily   melatonin 3 MG TABS tablet Take 3 mg by mouth at bedtime as needed.   metoprolol succinate (TOPROL-XL) 25 MG 24 hr tablet Take 25 mg by mouth daily.   nicotine (NICODERM CQ - DOSED IN MG/24 HOURS) 21 mg/24hr patch Place 21 mg onto the skin daily.   nitroGLYCERIN (NITROSTAT) 0.4 MG SL tablet Place under the tongue.   OXYGEN Inhale 2 L into the lungs at bedtime.   pantoprazole (PROTONIX) 40 MG tablet Take 1 tablet (40 mg total) by mouth 2 (two) times daily before meals   sodium chloride 1 g tablet Take 1 tablet (1 g total) by mouth 3 (three) times daily with meals.   tadalafil (CIALIS) 5  MG tablet Take 1 tablet (5 mg total) by mouth daily as needed for erectile dysfunction.    Immunization History  Administered Date(s) Administered   Fluad Quad(high Dose 65+) 09/22/2020   Influenza Inj Mdck Quad Pf 09/06/2016, 10/02/2019, 09/09/2021, 09/03/2022   Influenza Split 08/05/2013, 07/23/2014, 08/18/2015   Influenza Whole 08/01/2012   Influenza,inj,Quad PF,6+ Mos 08/14/2018   Influenza-Unspecified 09/06/2016, 08/17/2017, 09/09/2021   Zoster Recombinant(Shingrix) 06/19/2020        Objective:     BP 128/82 (BP Location: Right Arm, Cuff Size: Normal)   Pulse 92   Temp 99.1 F (37.3 C)   Ht 6' (1.829 m)   Wt 177 lb 3.2 oz (80.4 kg)   SpO2 96%   BMI 24.03 kg/m   SpO2: 96 % O2 Device: None  (Room air)  GENERAL: Well-developed, well-nourished gentleman, no respiratory distress.  Fully ambulatory.  No conversational dyspnea.  Fully alert and oriented. HEAD: Normocephalic, atraumatic.  EYES: Pupils equal, round, reactive to light.  No scleral icterus.  MOUTH: Oral mucosa moist.  No thrush. NECK: Supple. No thyromegaly. Trachea midline. No JVD.  No adenopathy. PULMONARY: Good air entry bilaterally.  Coarse, amphoric sounds on the right upper lung zone.  No wheezes noted.  Few rhonchi CARDIOVASCULAR: S1 and S2. Regular rate and rhythm.  No rubs, murmurs or gallops heard. ABDOMEN: Benign. MUSCULOSKELETAL: No joint deformity, no clubbing, no edema.  NEUROLOGIC: Mild psychomotor retardation.  No overt focal deficit.  No gait disturbance.  Speech is fluent. SKIN: Intact,warm,dry. PSYCH: Depressed mood and flat affect.      Assessment & Plan:     ICD-10-CM   1. Mycobacterium avium infection (HCC)  A31.0  MYCOBACTERIA, CULTURE, WITH FLUOROCHROME SMEAR     MYCOBACTERIA, CULTURE, WITH FLUOROCHROME SMEAR     MYCOBACTERIA, CULTURE, WITH FLUOROCHROME SMEAR    2. Stage 2 moderate COPD by GOLD classification (HCC)  J44.9     3. Cardiomyopathy, unspecified type (HCC)  I42.9     4. Nocturnal hypoxemia due to emphysema (HCC)  J43.9    G47.36     5. Cough with hemoptysis  R04.2 Respiratory or Resp and Sputum Culture    6. Tobacco dependence due to cigarettes  F17.210      Orders Placed This Encounter  Procedures    MYCOBACTERIA, CULTURE, WITH FLUOROCHROME SMEAR    Standing Status:   Future    Standing Expiration Date:   02/27/2024    MYCOBACTERIA, CULTURE, WITH FLUOROCHROME SMEAR    Standing Status:   Future    Standing Expiration Date:   02/27/2024    MYCOBACTERIA, CULTURE, WITH FLUOROCHROME SMEAR    Standing Status:   Future    Standing Expiration Date:   02/27/2024   Respiratory or Resp and Sputum Culture    Standing Status:   Future    Standing Expiration Date:   08/28/2024    Smoking cessation instruction/counseling given:  counseled patient on the dangers of tobacco use, advised patient to stop smoking, and reviewed strategies to maximize success.  Assessment and Plan    Mycobacterium lung infection Patient has been taken off amikacin and has been advised to reconsider azithromycin and ethambutol. The patient has a cavity in the lung and has been coughing up green and clear sputum, sometimes with streaks of blood. The patient is due to see a specialist next month and inhaled amikacin may be considered. -Collect sputum culture for analysis. -Contact specialist to discuss restarting azithromycin and ethambutol due to increased coughing since  stopping these medications.  COPD Patient reports increased coughing and difficulty breathing, particularly at night. The patient is using albuterol every three hours and Breztri inhaler, which provides some relief. -Continue current COPD management plan.  Tobacco Use Patient is a long-term smoker and has tried to quit using various methods including vaping and nicotine patches. The patient acknowledges the need to quit smoking to improve lung health. -Encourage patient to continue efforts to quit smoking. -Advise patient to use nicotine patches for 16-18 hours a day, removing at bedtime to improve sleep quality.  General Health Maintenance -Continue monitoring thyroid issue.     Will see patient in follow-up 4 to 6 weeks time call sooner should any problems arise.   Gailen Shelter, MD Advanced Bronchoscopy PCCM [facility]    *This note was dictated using voice recognition software/Dragon.  Despite best efforts to proofread, errors can occur which can change the meaning. Any transcriptional errors that result from this process are unintentional and may not be fully corrected at the time of dictation.

## 2023-08-30 ENCOUNTER — Other Ambulatory Visit
Admission: RE | Admit: 2023-08-30 | Discharge: 2023-08-30 | Disposition: A | Discharge status: Home / Self Care | Payer: Medicare Other | Source: Ambulatory Visit | Attending: Pulmonary Disease | Admitting: Pulmonary Disease

## 2023-08-30 DIAGNOSIS — A310 Pulmonary mycobacterial infection: Secondary | ICD-10-CM | POA: Diagnosis present

## 2023-08-30 DIAGNOSIS — R042 Hemoptysis: Secondary | ICD-10-CM | POA: Diagnosis present

## 2023-08-30 LAB — EXPECTORATED SPUTUM ASSESSMENT W GRAM STAIN, RFLX TO RESP C

## 2023-08-31 ENCOUNTER — Other Ambulatory Visit
Admission: RE | Admit: 2023-08-31 | Discharge: 2023-08-31 | Disposition: A | Discharge status: Home / Self Care | Payer: Medicare Other | Source: Ambulatory Visit | Attending: Pulmonary Disease | Admitting: Pulmonary Disease

## 2023-08-31 ENCOUNTER — Other Ambulatory Visit: Payer: Self-pay | Admitting: Pulmonary Disease

## 2023-08-31 DIAGNOSIS — A310 Pulmonary mycobacterial infection: Secondary | ICD-10-CM | POA: Diagnosis present

## 2023-08-31 DIAGNOSIS — R042 Hemoptysis: Secondary | ICD-10-CM | POA: Diagnosis present

## 2023-09-01 ENCOUNTER — Other Ambulatory Visit
Admission: RE | Admit: 2023-09-01 | Discharge: 2023-09-01 | Disposition: A | Discharge status: Home / Self Care | Payer: Medicare Other | Source: Ambulatory Visit | Attending: Pulmonary Disease | Admitting: Pulmonary Disease

## 2023-09-01 DIAGNOSIS — A310 Pulmonary mycobacterial infection: Secondary | ICD-10-CM | POA: Diagnosis present

## 2023-09-01 LAB — CULTURE, RESPIRATORY W GRAM STAIN: Culture: NORMAL

## 2023-09-01 LAB — ACID FAST SMEAR (AFB, MYCOBACTERIA): Acid Fast Smear: POSITIVE — AB

## 2023-09-03 LAB — ACID FAST SMEAR (AFB, MYCOBACTERIA): Acid Fast Smear: NEGATIVE

## 2023-09-05 ENCOUNTER — Telehealth: Payer: Self-pay

## 2023-09-05 ENCOUNTER — Other Ambulatory Visit (HOSPITAL_COMMUNITY): Payer: Self-pay

## 2023-09-05 ENCOUNTER — Encounter: Payer: Self-pay | Admitting: Oncology

## 2023-09-05 LAB — ACID FAST SMEAR (AFB, MYCOBACTERIA): Acid Fast Smear: POSITIVE — AB

## 2023-09-05 NOTE — Telephone Encounter (Signed)
*  Pulm  Pharmacy Patient Advocate Encounter   Received notification from CoverMyMeds that prior authorization for Albuterol Sulfate (2.5 MG/3ML)0.083% nebulizer solution  is required/requested.   Insurance verification completed.   The patient is insured through [facility] .   Per test claim:  Submit to Medicare Part B is preferred by the insurance.  If suggested medication is appropriate, Please send in a new RX and discontinue this one. If not, please advise as to why it's not appropriate so that we may request a Prior Authorization.

## 2023-09-13 ENCOUNTER — Ambulatory Visit: Payer: Medicare Other | Admitting: Infectious Diseases

## 2023-09-15 LAB — ACID FAST ID BY PCR AND SUSCEPTIBILITIES
M Tuberculosis Complex: NEGATIVE
M Tuberculosis Complex: NEGATIVE
M avium complex: POSITIVE — AB
M avium complex: POSITIVE — AB

## 2023-09-15 LAB — ACID FAST CULTURE WITH REFLEXED SENSITIVITIES (MYCOBACTERIA)
Acid Fast Culture: POSITIVE — AB
Acid Fast Culture: POSITIVE — AB

## 2023-09-27 ENCOUNTER — Inpatient Hospital Stay: Payer: Medicare Other | Attending: Oncology

## 2023-09-27 ENCOUNTER — Other Ambulatory Visit: Payer: Self-pay | Admitting: *Deleted

## 2023-09-27 DIAGNOSIS — C342 Malignant neoplasm of middle lobe, bronchus or lung: Secondary | ICD-10-CM | POA: Insufficient documentation

## 2023-09-27 DIAGNOSIS — Z95828 Presence of other vascular implants and grafts: Secondary | ICD-10-CM

## 2023-09-27 DIAGNOSIS — Z452 Encounter for adjustment and management of vascular access device: Secondary | ICD-10-CM | POA: Insufficient documentation

## 2023-09-27 LAB — CMP (CANCER CENTER ONLY)
ALT: 15 U/L (ref 0–44)
AST: 18 U/L (ref 15–41)
Albumin: 3.9 g/dL (ref 3.5–5.0)
Alkaline Phosphatase: 84 U/L (ref 38–126)
Anion gap: 11 (ref 5–15)
BUN: 6 mg/dL — ABNORMAL LOW (ref 8–23)
CO2: 26 mmol/L (ref 22–32)
Calcium: 8.7 mg/dL — ABNORMAL LOW (ref 8.9–10.3)
Chloride: 96 mmol/L — ABNORMAL LOW (ref 98–111)
Creatinine: 0.64 mg/dL (ref 0.61–1.24)
GFR, Estimated: >60 mL/min (ref >60)
Glucose, Bld: 103 mg/dL — ABNORMAL HIGH (ref 70–99)
Potassium: 4.1 mmol/L (ref 3.5–5.1)
Sodium: 133 mmol/L — ABNORMAL LOW (ref 135–145)
Total Bilirubin: 0.5 mg/dL (ref <1.2)
Total Protein: 6.7 g/dL (ref 6.5–8.1)

## 2023-09-27 LAB — CBC WITH DIFFERENTIAL/PLATELET
Abs Immature Granulocytes: 0.03 10*3/uL (ref 0.00–0.07)
Basophils Absolute: 0.1 10*3/uL (ref 0.0–0.1)
Basophils Relative: 1 %
Eosinophils Absolute: 0 10*3/uL (ref 0.0–0.5)
Eosinophils Relative: 0 %
HCT: 38.8 % — ABNORMAL LOW (ref 39.0–52.0)
Hemoglobin: 13.5 g/dL (ref 13.0–17.0)
Immature Granulocytes: 0 %
Lymphocytes Relative: 3 %
Lymphs Abs: 0.3 10*3/uL — ABNORMAL LOW (ref 0.7–4.0)
MCH: 32.3 pg (ref 26.0–34.0)
MCHC: 34.8 g/dL (ref 30.0–36.0)
MCV: 92.8 fL (ref 80.0–100.0)
Monocytes Absolute: 0.3 10*3/uL (ref 0.1–1.0)
Monocytes Relative: 3 %
Neutro Abs: 9 10*3/uL — ABNORMAL HIGH (ref 1.7–7.7)
Neutrophils Relative %: 93 %
Platelets: 247 10*3/uL (ref 150–400)
RBC: 4.18 MIL/uL — ABNORMAL LOW (ref 4.22–5.81)
RDW: 12.1 % (ref 11.5–15.5)
WBC: 9.7 10*3/uL (ref 4.0–10.5)
nRBC: 0 % (ref 0.0–0.2)

## 2023-09-27 MED ORDER — HEPARIN SOD (PORK) LOCK FLUSH 100 UNIT/ML IV SOLN
500.0000 [IU] | Freq: Once | INTRAVENOUS | Status: AC
  Administered 2023-09-27: 500 [IU] via INTRAVENOUS
  Filled 2023-09-27: qty 5

## 2023-09-27 MED ORDER — SODIUM CHLORIDE 0.9% FLUSH
10.0000 mL | Freq: Once | INTRAVENOUS | Status: AC
  Administered 2023-09-27: 10 mL via INTRAVENOUS
  Filled 2023-09-27: qty 10

## 2023-09-30 LAB — MAC SUSCEPTIBILITY BROTH
Ciprofloxacin: >8
Clarithromycin: >64
Doxycycline: >8
Linezolid: 32
Minocycline: >8
Moxifloxacin: >4
Rifabutin: 0.5
Rifampin: >4
Streptomycin: >32

## 2023-09-30 LAB — ACID FAST ID BY PCR AND SUSCEPTIBILITIES
M Tuberculosis Complex: NEGATIVE
M avium complex: POSITIVE — AB

## 2023-09-30 LAB — ACID FAST CULTURE WITH REFLEXED SENSITIVITIES (MYCOBACTERIA): Acid Fast Culture: POSITIVE — AB

## 2023-10-02 ENCOUNTER — Other Ambulatory Visit: Payer: Self-pay | Admitting: Infectious Diseases

## 2023-10-03 NOTE — Telephone Encounter (Signed)
Do you want to continue this medication?

## 2023-10-11 ENCOUNTER — Telehealth: Payer: Self-pay | Admitting: Pulmonary Disease

## 2023-10-11 NOTE — Telephone Encounter (Signed)
Pt's wife called he is doing home health care thru [facility]. They are potentially doing a trial medication that has had results for MAC but has not been FDA approved for [place]. Zella Ball is asking if you would be willing to look into the notes and give him a call on his cell. She states he trusts Dr. Jayme Cloud and doesn't want to lose her as his pulmonologist.

## 2023-10-11 NOTE — Telephone Encounter (Signed)
I have called patient and answered all of his questions.  He is currently receiving amikacin IV again as directed by [facility].  He is on azithromycin and ethambutol as well.  He is to start clofazimine which is being used off label in this instance due to his MAC resistance.  This is also being directed by [facility].  Will see patient in follow-up in 4 weeks time will arrange for appointment follow-up.  Patient was encouraged to call with questions.

## 2023-10-11 NOTE — Telephone Encounter (Signed)
Appt scheduled for 1/9 at 2:00pm. I have notified the patient.  Nothing further needed.

## 2023-10-13 ENCOUNTER — Ambulatory Visit: Payer: Medicare Other | Admitting: Pulmonary Disease

## 2023-10-25 ENCOUNTER — Other Ambulatory Visit: Payer: Self-pay | Admitting: Infectious Diseases

## 2023-11-03 ENCOUNTER — Telehealth: Payer: Self-pay | Admitting: Pulmonary Disease

## 2023-11-03 NOTE — Telephone Encounter (Signed)
 Patient has an appointment scheduled for January the 9th but is unsure if he needs this appointment since his care has been taken over by pulmonologist in [facility]. Please call and advise [phone]

## 2023-11-03 NOTE — Telephone Encounter (Signed)
 It is more important that he continue follow-up at [facility] at this point, we can keep his follow-up here "open".

## 2023-11-03 NOTE — Telephone Encounter (Signed)
 Lm x1 for the patient.

## 2023-11-04 NOTE — Telephone Encounter (Signed)
 Per Melissa, the patient called back. He has been notified and his f/u appt has been canceled.  Nothing further needed.

## 2023-11-08 ENCOUNTER — Other Ambulatory Visit: Payer: Self-pay

## 2023-11-08 ENCOUNTER — Inpatient Hospital Stay: Payer: Medicare Other

## 2023-11-08 ENCOUNTER — Emergency Department: Payer: Medicare Other

## 2023-11-08 ENCOUNTER — Encounter: Payer: Self-pay | Admitting: *Deleted

## 2023-11-08 ENCOUNTER — Inpatient Hospital Stay
Admission: EM | Admit: 2023-11-08 | Discharge: 2023-11-14 | DRG: 190 | Disposition: A | Discharge status: Home / Self Care | Payer: Medicare Other | Attending: Osteopathic Medicine | Admitting: Osteopathic Medicine

## 2023-11-08 ENCOUNTER — Ambulatory Visit (INDEPENDENT_AMBULATORY_CARE_PROVIDER_SITE_OTHER): Payer: Medicare Other | Admitting: Pulmonary Disease

## 2023-11-08 ENCOUNTER — Ambulatory Visit: Payer: Medicare Other

## 2023-11-08 ENCOUNTER — Ambulatory Visit (INDEPENDENT_AMBULATORY_CARE_PROVIDER_SITE_OTHER): Payer: Medicare Other

## 2023-11-08 ENCOUNTER — Encounter: Payer: Self-pay | Admitting: Pulmonary Disease

## 2023-11-08 VITALS — BP 136/82 | HR 89 | Temp 97.5°F | Ht 72.0 in | Wt 170.8 lb

## 2023-11-08 DIAGNOSIS — I10 Essential (primary) hypertension: Secondary | ICD-10-CM | POA: Diagnosis not present

## 2023-11-08 DIAGNOSIS — J984 Other disorders of lung: Secondary | ICD-10-CM | POA: Insufficient documentation

## 2023-11-08 DIAGNOSIS — Z9221 Personal history of antineoplastic chemotherapy: Secondary | ICD-10-CM

## 2023-11-08 DIAGNOSIS — J441 Chronic obstructive pulmonary disease with (acute) exacerbation: Principal | ICD-10-CM | POA: Diagnosis present

## 2023-11-08 DIAGNOSIS — Z8249 Family history of ischemic heart disease and other diseases of the circulatory system: Secondary | ICD-10-CM

## 2023-11-08 DIAGNOSIS — R042 Hemoptysis: Secondary | ICD-10-CM | POA: Diagnosis present

## 2023-11-08 DIAGNOSIS — C3491 Malignant neoplasm of unspecified part of right bronchus or lung: Secondary | ICD-10-CM | POA: Diagnosis present

## 2023-11-08 DIAGNOSIS — Z79890 Hormone replacement therapy (postmenopausal): Secondary | ICD-10-CM | POA: Diagnosis not present

## 2023-11-08 DIAGNOSIS — E2740 Unspecified adrenocortical insufficiency: Secondary | ICD-10-CM | POA: Insufficient documentation

## 2023-11-08 DIAGNOSIS — J449 Chronic obstructive pulmonary disease, unspecified: Secondary | ICD-10-CM | POA: Insufficient documentation

## 2023-11-08 DIAGNOSIS — J439 Emphysema, unspecified: Secondary | ICD-10-CM | POA: Diagnosis present

## 2023-11-08 DIAGNOSIS — G4733 Obstructive sleep apnea (adult) (pediatric): Secondary | ICD-10-CM | POA: Diagnosis present

## 2023-11-08 DIAGNOSIS — F17200 Nicotine dependence, unspecified, uncomplicated: Secondary | ICD-10-CM | POA: Diagnosis present

## 2023-11-08 DIAGNOSIS — D649 Anemia, unspecified: Secondary | ICD-10-CM | POA: Diagnosis present

## 2023-11-08 DIAGNOSIS — D751 Secondary polycythemia: Secondary | ICD-10-CM | POA: Diagnosis present

## 2023-11-08 DIAGNOSIS — E039 Hypothyroidism, unspecified: Secondary | ICD-10-CM | POA: Diagnosis present

## 2023-11-08 DIAGNOSIS — Z8042 Family history of malignant neoplasm of prostate: Secondary | ICD-10-CM

## 2023-11-08 DIAGNOSIS — R0602 Shortness of breath: Secondary | ICD-10-CM | POA: Insufficient documentation

## 2023-11-08 DIAGNOSIS — I509 Heart failure, unspecified: Secondary | ICD-10-CM | POA: Diagnosis present

## 2023-11-08 DIAGNOSIS — I110 Hypertensive heart disease with heart failure: Secondary | ICD-10-CM | POA: Diagnosis present

## 2023-11-08 DIAGNOSIS — I255 Ischemic cardiomyopathy: Secondary | ICD-10-CM | POA: Diagnosis present

## 2023-11-08 DIAGNOSIS — Z79899 Other long term (current) drug therapy: Secondary | ICD-10-CM | POA: Diagnosis not present

## 2023-11-08 DIAGNOSIS — F32A Depression, unspecified: Secondary | ICD-10-CM | POA: Diagnosis present

## 2023-11-08 DIAGNOSIS — Z8619 Personal history of other infectious and parasitic diseases: Secondary | ICD-10-CM

## 2023-11-08 DIAGNOSIS — I7300 Raynaud's syndrome without gangrene: Secondary | ICD-10-CM | POA: Diagnosis present

## 2023-11-08 DIAGNOSIS — J9611 Chronic respiratory failure with hypoxia: Secondary | ICD-10-CM | POA: Diagnosis not present

## 2023-11-08 DIAGNOSIS — Z888 Allergy status to other drugs, medicaments and biological substances status: Secondary | ICD-10-CM

## 2023-11-08 DIAGNOSIS — Z9981 Dependence on supplemental oxygen: Secondary | ICD-10-CM

## 2023-11-08 DIAGNOSIS — G4700 Insomnia, unspecified: Secondary | ICD-10-CM | POA: Diagnosis present

## 2023-11-08 DIAGNOSIS — Z85118 Personal history of other malignant neoplasm of bronchus and lung: Secondary | ICD-10-CM

## 2023-11-08 DIAGNOSIS — F419 Anxiety disorder, unspecified: Secondary | ICD-10-CM | POA: Diagnosis present

## 2023-11-08 DIAGNOSIS — E222 Syndrome of inappropriate secretion of antidiuretic hormone: Secondary | ICD-10-CM | POA: Diagnosis present

## 2023-11-08 DIAGNOSIS — Z860100 Personal history of colon polyps, unspecified: Secondary | ICD-10-CM

## 2023-11-08 DIAGNOSIS — I5020 Unspecified systolic (congestive) heart failure: Secondary | ICD-10-CM | POA: Insufficient documentation

## 2023-11-08 DIAGNOSIS — A310 Pulmonary mycobacterial infection: Secondary | ICD-10-CM

## 2023-11-08 DIAGNOSIS — Z981 Arthrodesis status: Secondary | ICD-10-CM

## 2023-11-08 DIAGNOSIS — K219 Gastro-esophageal reflux disease without esophagitis: Secondary | ICD-10-CM | POA: Diagnosis present

## 2023-11-08 DIAGNOSIS — Z923 Personal history of irradiation: Secondary | ICD-10-CM

## 2023-11-08 DIAGNOSIS — I2510 Atherosclerotic heart disease of native coronary artery without angina pectoris: Secondary | ICD-10-CM | POA: Diagnosis present

## 2023-11-08 DIAGNOSIS — E871 Hypo-osmolality and hyponatremia: Secondary | ICD-10-CM | POA: Diagnosis present

## 2023-11-08 DIAGNOSIS — I493 Ventricular premature depolarization: Secondary | ICD-10-CM | POA: Diagnosis present

## 2023-11-08 DIAGNOSIS — R41843 Psychomotor deficit: Secondary | ICD-10-CM | POA: Diagnosis present

## 2023-11-08 DIAGNOSIS — J9621 Acute and chronic respiratory failure with hypoxia: Secondary | ICD-10-CM

## 2023-11-08 DIAGNOSIS — F17210 Nicotine dependence, cigarettes, uncomplicated: Secondary | ICD-10-CM | POA: Insufficient documentation

## 2023-11-08 DIAGNOSIS — Z515 Encounter for palliative care: Secondary | ICD-10-CM | POA: Diagnosis not present

## 2023-11-08 DIAGNOSIS — I779 Disorder of arteries and arterioles, unspecified: Secondary | ICD-10-CM | POA: Diagnosis present

## 2023-11-08 DIAGNOSIS — I429 Cardiomyopathy, unspecified: Secondary | ICD-10-CM | POA: Insufficient documentation

## 2023-11-08 DIAGNOSIS — Z95828 Presence of other vascular implants and grafts: Secondary | ICD-10-CM | POA: Diagnosis not present

## 2023-11-08 DIAGNOSIS — T371X5A Adverse effect of antimycobacterial drugs, initial encounter: Secondary | ICD-10-CM | POA: Diagnosis present

## 2023-11-08 LAB — PULMONARY FUNCTION TEST ARMC ONLY
DL/VA % pred: 59 %
DL/VA: 2.43 ml/min/mmHg/L
DLCO unc % pred: 48 %
DLCO unc: 13.83 ml/min/mmHg
FEF 25-75 Post: 0.33 L/s
FEF 25-75 Pre: 0.42 L/s
FEF2575-%Change-Post: -20 %
FEF2575-%Pred-Post: 11 %
FEF2575-%Pred-Pre: 14 %
FEV1-%Change-Post: -9 %
FEV1-%Pred-Post: 21 %
FEV1-%Pred-Pre: 23 %
FEV1-Post: 0.78 L
FEV1-Pre: 0.86 L
FEV1FVC-%Change-Post: 0 %
FEV1FVC-%Pred-Pre: 59 %
FEV6-%Change-Post: -9 %
FEV6-%Pred-Post: 37 %
FEV6-%Pred-Pre: 41 %
FEV6-Post: 1.77 L
FEV6-Pre: 1.95 L
FEV6FVC-%Pred-Post: 105 %
FEV6FVC-%Pred-Pre: 105 %
FVC-%Change-Post: -9 %
FVC-%Pred-Post: 35 %
FVC-%Pred-Pre: 39 %
FVC-Post: 1.77 L
FVC-Pre: 1.95 L
Post FEV1/FVC ratio: 44 %
Post FEV6/FVC ratio: 100 %
Pre FEV1/FVC ratio: 44 %
Pre FEV6/FVC Ratio: 100 %

## 2023-11-08 LAB — COMPREHENSIVE METABOLIC PANEL
ALT: 17 U/L (ref 0–44)
AST: 19 U/L (ref 15–41)
Albumin: 3.8 g/dL (ref 3.5–5.0)
Alkaline Phosphatase: 77 U/L (ref 38–126)
Anion gap: 8 (ref 5–15)
BUN: 12 mg/dL (ref 8–23)
CO2: 32 mmol/L (ref 22–32)
Calcium: 8.6 mg/dL — ABNORMAL LOW (ref 8.9–10.3)
Chloride: 91 mmol/L — ABNORMAL LOW (ref 98–111)
Creatinine, Ser: 0.76 mg/dL (ref 0.61–1.24)
GFR, Estimated: >60 mL/min (ref >60)
Glucose, Bld: 103 mg/dL — ABNORMAL HIGH (ref 70–99)
Potassium: 3.9 mmol/L (ref 3.5–5.1)
Sodium: 131 mmol/L — ABNORMAL LOW (ref 135–145)
Total Bilirubin: 0.6 mg/dL (ref 0.0–1.2)
Total Protein: 6.3 g/dL — ABNORMAL LOW (ref 6.5–8.1)

## 2023-11-08 LAB — CBC WITH DIFFERENTIAL/PLATELET
Abs Immature Granulocytes: 0.02 10*3/uL (ref 0.00–0.07)
Basophils Absolute: 0 10*3/uL (ref 0.0–0.1)
Basophils Relative: 0 %
Eosinophils Absolute: 0 10*3/uL (ref 0.0–0.5)
Eosinophils Relative: 0 %
HCT: 41.3 % (ref 39.0–52.0)
Hemoglobin: 14.3 g/dL (ref 13.0–17.0)
Immature Granulocytes: 0 %
Lymphocytes Relative: 3 %
Lymphs Abs: 0.3 10*3/uL — ABNORMAL LOW (ref 0.7–4.0)
MCH: 31.8 pg (ref 26.0–34.0)
MCHC: 34.6 g/dL (ref 30.0–36.0)
MCV: 92 fL (ref 80.0–100.0)
Monocytes Absolute: 0.3 10*3/uL (ref 0.1–1.0)
Monocytes Relative: 3 %
Neutro Abs: 9.2 10*3/uL — ABNORMAL HIGH (ref 1.7–7.7)
Neutrophils Relative %: 94 %
Platelets: 213 10*3/uL (ref 150–400)
RBC: 4.49 MIL/uL (ref 4.22–5.81)
RDW: 11.9 % (ref 11.5–15.5)
WBC: 9.7 10*3/uL (ref 4.0–10.5)
nRBC: 0 % (ref 0.0–0.2)

## 2023-11-08 LAB — BRAIN NATRIURETIC PEPTIDE: B Natriuretic Peptide: 83.4 pg/mL (ref 0.0–100.0)

## 2023-11-08 LAB — LACTIC ACID, PLASMA
Lactic Acid, Venous: 1.3 mmol/L (ref 0.5–1.9)
Lactic Acid, Venous: 2.1 mmol/L (ref 0.5–1.9)

## 2023-11-08 LAB — TROPONIN I (HIGH SENSITIVITY)
Troponin I (High Sensitivity): 7 ng/L (ref <18)
Troponin I (High Sensitivity): 7 ng/L (ref <18)

## 2023-11-08 LAB — PROCALCITONIN: Procalcitonin: 0.35 ng/mL

## 2023-11-08 MED ORDER — IPRATROPIUM-ALBUTEROL 0.5-2.5 (3) MG/3ML IN SOLN
3.0000 mL | Freq: Four times a day (QID) | RESPIRATORY_TRACT | Status: DC
  Administered 2023-11-09 (×2): 3 mL via RESPIRATORY_TRACT
  Filled 2023-11-08 (×2): qty 3

## 2023-11-08 MED ORDER — ALBUTEROL SULFATE (2.5 MG/3ML) 0.083% IN NEBU
2.5000 mg | INHALATION_SOLUTION | Freq: Once | RESPIRATORY_TRACT | Status: AC
  Administered 2023-11-08: 2.5 mg via RESPIRATORY_TRACT
  Filled 2023-11-08: qty 3

## 2023-11-08 MED ORDER — ENOXAPARIN SODIUM 40 MG/0.4ML IJ SOSY
40.0000 mg | PREFILLED_SYRINGE | INTRAMUSCULAR | Status: DC
  Administered 2023-11-09 – 2023-11-14 (×6): 40 mg via SUBCUTANEOUS
  Filled 2023-11-08 (×6): qty 0.4

## 2023-11-08 MED ORDER — PANTOPRAZOLE SODIUM 40 MG PO TBEC
40.0000 mg | DELAYED_RELEASE_TABLET | Freq: Every day | ORAL | Status: DC
  Administered 2023-11-09 – 2023-11-14 (×6): 40 mg via ORAL
  Filled 2023-11-08 (×6): qty 1

## 2023-11-08 MED ORDER — METHYLPREDNISOLONE SODIUM SUCC 125 MG IJ SOLR
125.0000 mg | Freq: Once | INTRAMUSCULAR | Status: AC
  Administered 2023-11-08: 125 mg via INTRAVENOUS
  Filled 2023-11-08: qty 2

## 2023-11-08 MED ORDER — DRONABINOL 5 MG PO CAPS
5.0000 mg | ORAL_CAPSULE | Freq: Two times a day (BID) | ORAL | Status: DC
  Filled 2023-11-08: qty 1

## 2023-11-08 MED ORDER — SODIUM CHLORIDE 0.9 % IV SOLN
2.0000 g | Freq: Once | INTRAVENOUS | Status: AC
  Administered 2023-11-08: 2 g via INTRAVENOUS
  Filled 2023-11-08: qty 12.5

## 2023-11-08 MED ORDER — HYDROCODONE-ACETAMINOPHEN 5-325 MG PO TABS
1.0000 | ORAL_TABLET | ORAL | Status: DC | PRN
  Administered 2023-11-09 – 2023-11-12 (×5): 1 via ORAL
  Administered 2023-11-13 – 2023-11-14 (×7): 2 via ORAL
  Filled 2023-11-08 (×3): qty 2
  Filled 2023-11-08: qty 1
  Filled 2023-11-08: qty 2
  Filled 2023-11-08 (×2): qty 1
  Filled 2023-11-08: qty 2
  Filled 2023-11-08: qty 1
  Filled 2023-11-08 (×2): qty 2
  Filled 2023-11-08: qty 1

## 2023-11-08 MED ORDER — IPRATROPIUM-ALBUTEROL 0.5-2.5 (3) MG/3ML IN SOLN
3.0000 mL | Freq: Once | RESPIRATORY_TRACT | Status: AC
  Administered 2023-11-08: 3 mL via RESPIRATORY_TRACT
  Filled 2023-11-08: qty 3

## 2023-11-08 MED ORDER — SODIUM CHLORIDE 0.9 % IV SOLN
2.0000 g | Freq: Three times a day (TID) | INTRAVENOUS | Status: AC
  Administered 2023-11-09 – 2023-11-13 (×14): 2 g via INTRAVENOUS
  Filled 2023-11-08 (×14): qty 12.5

## 2023-11-08 MED ORDER — ONDANSETRON HCL 4 MG/2ML IJ SOLN: 4.0000 mg | Freq: Four times a day (QID) | INTRAMUSCULAR | Status: DC | PRN

## 2023-11-08 MED ORDER — ACETAMINOPHEN 650 MG RE SUPP: 650.0000 mg | Freq: Four times a day (QID) | RECTAL | Status: DC | PRN

## 2023-11-08 MED ORDER — PREDNISONE 20 MG PO TABS: 40.0000 mg | ORAL_TABLET | Freq: Every day | ORAL | Status: DC

## 2023-11-08 MED ORDER — VANCOMYCIN HCL 1750 MG/350ML IV SOLN
1750.0000 mg | Freq: Once | INTRAVENOUS | Status: AC
  Administered 2023-11-08: 1750 mg via INTRAVENOUS
  Filled 2023-11-08: qty 350

## 2023-11-08 MED ORDER — MELATONIN 5 MG PO TABS: 2.5000 mg | ORAL_TABLET | Freq: Every evening | ORAL | Status: DC | PRN

## 2023-11-08 MED ORDER — NICOTINE 21 MG/24HR TD PT24
21.0000 mg | MEDICATED_PATCH | Freq: Every day | TRANSDERMAL | Status: DC
  Administered 2023-11-09 – 2023-11-14 (×6): 21 mg via TRANSDERMAL
  Filled 2023-11-08 (×6): qty 1

## 2023-11-08 MED ORDER — IOHEXOL 350 MG/ML SOLN
80.0000 mL | Freq: Once | INTRAVENOUS | Status: AC | PRN
  Administered 2023-11-08: 80 mL via INTRAVENOUS

## 2023-11-08 MED ORDER — VANCOMYCIN HCL 1250 MG/250ML IV SOLN
1250.0000 mg | Freq: Two times a day (BID) | INTRAVENOUS | Status: DC
  Administered 2023-11-09: 1250 mg via INTRAVENOUS
  Filled 2023-11-08 (×2): qty 250

## 2023-11-08 MED ORDER — GUAIFENESIN ER 600 MG PO TB12
600.0000 mg | ORAL_TABLET | Freq: Two times a day (BID) | ORAL | Status: DC
  Administered 2023-11-09 – 2023-11-13 (×10): 600 mg via ORAL
  Filled 2023-11-08 (×10): qty 1

## 2023-11-08 MED ORDER — LEVOTHYROXINE SODIUM 200 MCG PO TABS
200.0000 ug | ORAL_TABLET | Freq: Every day | ORAL | Status: DC
  Administered 2023-11-09: 200 ug via ORAL
  Filled 2023-11-08: qty 1
  Filled 2023-11-08: qty 4

## 2023-11-08 MED ORDER — SODIUM CHLORIDE 0.9 % IV SOLN
500.0000 mg | INTRAVENOUS | Status: DC
  Administered 2023-11-09 (×2): 500 mg via INTRAVENOUS
  Filled 2023-11-08 (×2): qty 5

## 2023-11-08 MED ORDER — MAGNESIUM OXIDE 400 MG PO TABS
600.0000 mg | ORAL_TABLET | Freq: Two times a day (BID) | ORAL | Status: DC
  Administered 2023-11-09 – 2023-11-14 (×11): 600 mg via ORAL
  Filled 2023-11-08: qty 2
  Filled 2023-11-08: qty 1.5
  Filled 2023-11-08: qty 2
  Filled 2023-11-08: qty 1.5
  Filled 2023-11-08 (×2): qty 2
  Filled 2023-11-08 (×2): qty 1.5
  Filled 2023-11-08: qty 2
  Filled 2023-11-08 (×2): qty 1.5
  Filled 2023-11-08: qty 2
  Filled 2023-11-08 (×3): qty 1.5
  Filled 2023-11-08: qty 2
  Filled 2023-11-08 (×2): qty 1.5
  Filled 2023-11-08: qty 2
  Filled 2023-11-08: qty 1.5

## 2023-11-08 MED ORDER — SODIUM CHLORIDE 0.9 % IV BOLUS
1000.0000 mL | Freq: Once | INTRAVENOUS | Status: AC
  Administered 2023-11-08: 1000 mL via INTRAVENOUS

## 2023-11-08 MED ORDER — ACETAMINOPHEN 325 MG PO TABS: 650.0000 mg | ORAL_TABLET | Freq: Four times a day (QID) | ORAL | Status: DC | PRN

## 2023-11-08 MED ORDER — METHYLPREDNISOLONE SODIUM SUCC 40 MG IJ SOLR
40.0000 mg | Freq: Two times a day (BID) | INTRAMUSCULAR | Status: DC
  Administered 2023-11-09: 40 mg via INTRAVENOUS
  Filled 2023-11-08: qty 1

## 2023-11-08 MED ORDER — ALBUTEROL SULFATE (2.5 MG/3ML) 0.083% IN NEBU
2.5000 mg | INHALATION_SOLUTION | RESPIRATORY_TRACT | Status: DC | PRN
  Administered 2023-11-09: 2.5 mg via RESPIRATORY_TRACT
  Filled 2023-11-08: qty 3

## 2023-11-08 MED ORDER — ONDANSETRON HCL 4 MG PO TABS: 4.0000 mg | ORAL_TABLET | Freq: Four times a day (QID) | ORAL | Status: DC | PRN

## 2023-11-08 NOTE — ED Triage Notes (Signed)
 Pt sent from dr's office for eval of MAC.  Pt is sob.  No chest pain.  Pt has a cough.  Dx with MAC for 1.5 years  hx lung cancer.  Pt has a port  pt alert  speech clear.

## 2023-11-08 NOTE — Consult Note (Addendum)
 ED Pharmacy Antibiotic Sign Off An antibiotic consult was received from an ED provider for vancomycin  and cefepime  per pharmacy dosing for HCAP. A chart review was completed to assess appropriateness.   The following one time order(s) were placed:  Cefepime  2g x 1 Vancomycin  1750 x 1  Further antibiotic and/or antibiotic pharmacy consults should be ordered by the admitting provider if indicated.   Thank you for allowing pharmacy to be a part of this patient's care.   Alfonso MARLA Buys, PharmD Pharmacy Resident  11/08/2023 7:25 PM

## 2023-11-08 NOTE — Assessment & Plan Note (Signed)
 Continue losartan and metoprolol

## 2023-11-08 NOTE — Assessment & Plan Note (Signed)
 Continue levothyroxine

## 2023-11-08 NOTE — Assessment & Plan Note (Addendum)
 Cavitary mass/stable pulmonary nodules No acute issues suspected Follow-up with oncology

## 2023-11-08 NOTE — H&P (Signed)
 History and Physical    Patient: Paul Carpenter FMW:[id number] DOB: 10-22-1956 DOA: 11/08/2023 DOS: the patient was seen and examined on 11/08/2023 PCP: Sadie Manna, MD  Patient coming from: Home  Chief Complaint:  Chief Complaint  Patient presents with   Shortness of Breath    HPI: Paul Carpenter is a 68 y.o. 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 Tamea for IV antibiotics and steroids after failing outpatient management of her COPD exacerbation.  Patient has had progressive shortness of breath for the last 4 to 5 weeks worse over the last 2 to 3 days associated with cough productive of green sputum and occasional hemoptysis.  Has been on prednisone  and amikacin  without much improvement.  Uses O2 at 2 L with O2 sats at home have been 82 to 85% on home flow rate.  He denies chest pain, fever or chills. ED course and data review: Afebrile, heart rate 80s to 90s, tachypneic in the 30s, with O2 sat in the mid 90s on 2 L. Pertinent workup include the following: Normal WBC with lactic acid 2.1, procalcitonin 0.35.  CBC otherwise unremarkable Troponin 7 and BNP 83.4 Mild hyponatremia of 131 but otherwise unremarkable CMP  EKG, personally viewed and interpreted showing sinus tachycardia at 91 with a few PVCs. CTA chest PE protocol negative for PE, showing known cavitary mass and stable pulmonary nodules as well as advanced emphysema. Patient treated with DuoNebs and Solu-Medrol  and started on cefepime  and vancomycin . Hospitalist consulted for admission.   Review of Systems: As mentioned in the history of present illness. All other systems reviewed and are negative.  Past Medical History:  Diagnosis Date   Anginal pain (HCC)    Anxiety    Asthma    Chest pain    CHF (congestive  heart failure) (HCC)    Chicken pox    Complication of anesthesia    o2 dropped after neck fusion   COPD (chronic obstructive pulmonary disease) (HCC)    Coronary artery disease    Cough    chronic  clear phlegm   Dysrhythmia    palpitations   GERD (gastroesophageal reflux disease)    h/o reflux/ hoarsness   Hematochezia    Hemorrhoids    History of chickenpox    History of colon polyps    History of Helicobacter pylori infection    Hoarseness    Hypertension    Lung cancer (HCC) 05/2016   Chemo + rad tx's.    Migraines    OSA (obstructive sleep apnea)    has CPAP but does not use   Personal history of tobacco use, presenting hazards to health 03/05/2016   Pneumonia    5/17   Raynaud disease    Raynaud disease    Raynaud's disease    Rotator cuff tear    on right   Shortness of breath dyspnea    Sleep apnea    Ulcer (traumatic) of oral mucosa    Past Surgical History:  Procedure Laterality Date   BACK SURGERY     cervical fusion x 2   CARDIAC CATHETERIZATION     CERVICAL DISCECTOMY     x 2   COLONOSCOPY     COLONOSCOPY N/A 07/25/2015   Procedure: COLONOSCOPY;  Surgeon: Gladis RAYMOND Mariner, MD;  Location:  [facility] ENDOSCOPY;  Service: Endoscopy;  Laterality: N/A;   COLONOSCOPY WITH PROPOFOL  N/A 10/04/2017   Procedure: COLONOSCOPY WITH PROPOFOL ;  Surgeon: Gaylyn Gladis PENNER, MD;  Location: [facility] ENDOSCOPY;  Service: Endoscopy;  Laterality: N/A;   COLONOSCOPY WITH PROPOFOL  N/A 07/10/2020   Procedure: COLONOSCOPY WITH PROPOFOL ;  Surgeon: Toledo, Ladell POUR, MD;  Location: [facility] ENDOSCOPY;  Service: Gastroenterology;  Laterality: N/A;   ELECTROMAGNETIC NAVIGATION BROCHOSCOPY Left 06/28/2016   Procedure: ELECTROMAGNETIC NAVIGATION BRONCHOSCOPY;  Surgeon: Jorie Cha, MD;  Location: [facility] ORS;  Service: Cardiopulmonary;  Laterality: Left;   ENDOBRONCHIAL ULTRASOUND N/A 04/11/2018   Procedure: ENDOBRONCHIAL ULTRASOUND;  Surgeon: Isaiah Scrivener, MD;  Location: [facility] ORS;  Service:  Cardiopulmonary;  Laterality: N/A;   ESOPHAGOGASTRODUODENOSCOPY N/A 07/25/2015   Procedure: ESOPHAGOGASTRODUODENOSCOPY (EGD);  Surgeon: Gladis PENNER Gaylyn, MD;  Location: [facility] ENDOSCOPY;  Service: Endoscopy;  Laterality: N/A;   ESOPHAGOGASTRODUODENOSCOPY (EGD) WITH PROPOFOL  N/A 07/10/2020   Procedure: ESOPHAGOGASTRODUODENOSCOPY (EGD) WITH PROPOFOL ;  Surgeon: Toledo, Ladell POUR, MD;  Location: [facility] ENDOSCOPY;  Service: Gastroenterology;  Laterality: N/A;   NASAL SINUS SURGERY     x 2    PORTA CATH INSERTION N/A 04/24/2018   Procedure: PORTA CATH INSERTION;  Surgeon: Marea Selinda RAMAN, MD;  Location: [facility] INVASIVE CV LAB;  Service: Cardiovascular;  Laterality: N/A;   rotator cuff surgery Right    07/2016   SEPTOPLASTY     SKIN GRAFT     Social History:  reports that he has been smoking cigarettes. He has a 150 pack-year smoking history. He has been exposed to tobacco smoke. He has never used smokeless tobacco. He reports that he does not currently use alcohol after a past usage of about 2.0 standard drinks of alcohol per week. He reports that he does not use drugs.  Allergies  Allergen Reactions   Lisinopril Rash   Varenicline  Rash    Family History  Problem Relation Age of Onset   Heart disease Father    Prostate cancer Father    Heart disease Paternal Grandmother    Heart attack Maternal Grandfather 44   Kidney cancer Neg Hx    Bladder Cancer Neg Hx    Other Neg Hx        pituitary abnormality    Prior to Admission medications   Medication Sig Start Date End Date Taking? Authorizing Provider  acetaminophen  (TYLENOL ) 500 MG tablet Take 500 mg by mouth daily as needed.   Yes [provider]  albuterol  (PROVENTIL ) (2.5 MG/3ML) 0.083% nebulizer solution TAKE 3 MLS (2.5 MG TOTAL) BY NEBULIZATION 4 (FOUR) TIMES DAILY AS NEEDED FOR WHEEZING OR SHORTNESS OF BREATH. 08/31/23 08/30/24 Yes Tamea Dedra CROME, MD  albuterol  (VENTOLIN  HFA) 108 (90 Base) MCG/ACT inhaler INHALE 2 PUFFS BY MOUTH  EVERY 4 HOURS AS NEEDED FOR WHEEZE OR FOR SHORTNESS OF BREATH 07/05/23  Yes Tamea Dedra CROME, MD  amikacin  (AMIKIN ) 1 GM/4ML SOLN injection Inject into the muscle daily. 10/30/23  Yes [provider]  atorvastatin  (LIPITOR) 10 MG tablet Take 10 mg by mouth daily. 08/17/22  Yes [provider]  azithromycin  (ZITHROMAX ) 500 MG tablet TAKE 1 TABLET (500 MG TOTAL) BY MOUTH DAILY. 10/04/23  Yes Fayette Bodily, MD  BREZTRI  AEROSPHERE 160-9-4.8 MCG/ACT AERO INHALE 2 PUFFS INTO THE LUNGS IN THE MORNING AND AT BEDTIME. 05/20/23  Yes Tamea Dedra CROME, MD  ethambutol  (MYAMBUTOL ) 400 MG tablet Take 3 tablets (1,200 mg total) by mouth daily. 02/21/23  Yes Fayette Bodily, MD  folic acid  (FOLVITE ) 1 MG tablet Take  1 mg by mouth daily. 10/18/23 10/17/24 Yes [provider]  hydrocortisone  (CORTEF ) 10 MG tablet Take two tablets in the morning. Take one tablet in the afternoon (between 2-4pm). 03/03/23  Yes [provider]  levothyroxine  (SYNTHROID ) 200 MCG tablet Take 200 mcg by mouth daily before breakfast. 09/10/22 11/08/23 Yes [provider]  losartan (COZAAR) 25 MG tablet Take 25 mg by mouth daily. 07/25/23 07/24/24 Yes [provider]  pantoprazole  (PROTONIX ) 40 MG tablet Take 1 tablet (40 mg total) by mouth 2 (two) times daily before meals 02/15/22  Yes [provider]  predniSONE  (DELTASONE ) 20 MG tablet Take by mouth.  Take 2 tablets (40 mg total) by mouth daily for 3 days, THEN 1.5 tablets (30 mg total) daily for 3 days, THEN 1 tablet (20 mg total) daily for 3 days, THEN 0.5 tablets (10 mg total) daily for 3 days.    Instructions: Take 2 tablets (40 mg total) by mouth daily for 3 days, THEN 1.5 tablets (30 mg total) daily for 3 days, THEN 1 tablet (20 mg total) daily for 3 days, THEN 0.5 tablets (10 mg total) daily for 3 days. 11/01/23 11/13/23 Yes [provider]  tadalafil  (CIALIS ) 5 MG tablet Take 1 tablet (5 mg total) by mouth  daily as needed for erectile dysfunction. 12/30/22  Yes McGowan, Clotilda LABOR, PA-C  clofazimine  50 mg CAPS capsule (for compassionate use) Take 50 mg by mouth daily with breakfast.    [provider]  diazepam  (VALIUM ) 5 MG tablet Take 5 mg by mouth at bedtime as needed. 11/30/21   [provider]  dronabinol  (MARINOL ) 5 MG capsule Take 1 capsule (5 mg total) by mouth 2 (two) times daily before lunch and supper. 11/23/22   Jacobo Evalene PARAS, MD  ferrous sulfate  325 (65 FE) MG tablet Take 325 mg by mouth at bedtime.    [provider]  lidocaine -prilocaine  (EMLA ) cream APPLY 1 APPLICATION TOPICALLY AS NEEDED. 04/27/23   Jacobo Evalene PARAS, MD  magnesium  oxide (MAG-OX) 400 MG tablet Take 600 mg by mouth 2 (two) times daily. Takes 1.5 tablet twice daily    [provider]  melatonin 3 MG TABS tablet Take 3 mg by mouth at bedtime as needed. 05/30/23   [provider]  metoprolol  succinate (TOPROL -XL) 25 MG 24 hr tablet Take 25 mg by mouth daily. 07/25/23 07/24/24  [provider]  nicotine  (NICODERM CQ  - DOSED IN MG/24 HOURS) 21 mg/24hr patch Place 21 mg onto the skin daily. 06/13/23   [provider]  nitroGLYCERIN  (NITROSTAT ) 0.4 MG SL tablet Place under the tongue. 04/07/21   [provider]  OXYGEN Inhale 2 L into the lungs at bedtime.    [provider]  rifampin  (RIFADIN ) 300 MG capsule Take 2 capsules (600 mg total) by mouth daily. Patient not taking: Reported on 11/08/2023 02/21/23   Fayette Bodily, MD  sodium chloride  1 g tablet Take 1 tablet (1 g total) by mouth 3 (three) times daily with meals. 09/22/21   Krishnan, Sendil K, MD  ipratropium-albuterol  (DUONEB) 0.5-2.5 (3) MG/3ML SOLN Take 3 mLs by nebulization every 6 (six) hours as needed. 01/22/22 02/09/22  Borders, Fonda SAUNDERS, NP  levalbuterol  (XOPENEX  HFA) 45 MCG/ACT inhaler Inhale 1 puff into the lungs every 6 (six) hours as needed for wheezing. 11/19/21 02/09/22  Isaiah Scrivener, MD    Physical Exam: Vitals:   11/08/23 2030 11/08/23 2115 11/08/23 2136 11/08/23 2202  BP: 127/82  (!) 136/103  Pulse: 85 92 79   Resp: (!) 26 (!) 43    Temp:    98.1 F (36.7 C)  TempSrc:    Oral  SpO2: 92% 93% 93%   Weight:      Height:       Physical Exam Vitals and nursing note reviewed.  Constitutional:      General: He is not in acute distress. HENT:     Head: Normocephalic and atraumatic.  Cardiovascular:     Rate and Rhythm: Normal rate and regular rhythm.     Heart sounds: Normal heart sounds.  Pulmonary:     Effort: Tachypnea present.     Breath sounds: Wheezing and rhonchi present.  Abdominal:     Palpations: Abdomen is soft.     Tenderness: There is no abdominal tenderness.  Neurological:     Mental Status: Mental status is at baseline.     Labs on Admission: I have personally reviewed following labs and imaging studies  CBC: Recent Labs  Lab 11/08/23 1710  WBC 9.7  NEUTROABS 9.2*  HGB 14.3  HCT 41.3  MCV 92.0  PLT 213   Basic Metabolic Panel: Recent Labs  Lab 11/08/23 1710  NA 131*  K 3.9  CL 91*  CO2 32  GLUCOSE 103*  BUN 12  CREATININE 0.76  CALCIUM  8.6*   GFR: Estimated Creatinine Clearance: 99.1 mL/min (by C-G formula based on SCr of 0.76 mg/dL). Liver Function Tests: Recent Labs  Lab 11/08/23 1710  AST 19  ALT 17  ALKPHOS 77  BILITOT 0.6  PROT 6.3*  ALBUMIN 3.8   No results for input(s): LIPASE, AMYLASE in the last 168 hours. No results for input(s): AMMONIA in the last 168 hours. Coagulation Profile: No results for input(s): INR, PROTIME in the last 168 hours. Cardiac Enzymes: No results for input(s): CKTOTAL, CKMB, CKMBINDEX, TROPONINI in the last 168 hours. BNP (last 3 results) No results for input(s): PROBNP in the last 8760 hours. HbA1C: No results for input(s): HGBA1C in the last 72 hours. CBG: No results for input(s): GLUCAP in the last 168 hours. Lipid Profile: No  results for input(s): CHOL, HDL, LDLCALC, TRIG, CHOLHDL, LDLDIRECT in the last 72 hours. Thyroid  Function Tests: No results for input(s): TSH, T4TOTAL, FREET4, T3FREE, THYROIDAB in the last 72 hours. Anemia Panel: No results for input(s): VITAMINB12, FOLATE, FERRITIN, TIBC, IRON , RETICCTPCT in the last 72 hours. Urine analysis:    Component Value Date/Time   COLORURINE YELLOW (A) 02/09/2023 2128   APPEARANCEUR CLEAR (A) 02/09/2023 2128   APPEARANCEUR Clear 08/15/2020 0951   LABSPEC 1.013 02/09/2023 2128   LABSPEC 1.004 01/26/2012 0944   PHURINE 6.0 02/09/2023 2128   GLUCOSEU NEGATIVE 02/09/2023 2128   GLUCOSEU Negative 01/26/2012 0944   HGBUR NEGATIVE 02/09/2023 2128   BILIRUBINUR NEGATIVE 02/09/2023 2128   BILIRUBINUR Negative 08/15/2020 0951   BILIRUBINUR Negative 01/26/2012 0944   KETONESUR NEGATIVE 02/09/2023 2128   PROTEINUR NEGATIVE 02/09/2023 2128   NITRITE NEGATIVE 02/09/2023 2128   LEUKOCYTESUR NEGATIVE 02/09/2023 2128   LEUKOCYTESUR Negative 01/26/2012 0944    Radiological Exams on Admission: CT Angio Chest PE W and/or Wo Contrast Result Date: 11/08/2023 CLINICAL DATA:  Shortness of breath EXAM: CT ANGIOGRAPHY CHEST WITH CONTRAST TECHNIQUE: Multidetector CT imaging of the chest was performed using the standard protocol during bolus administration of intravenous contrast. Multiplanar CT image reconstructions and MIPs were obtained to evaluate the vascular anatomy. RADIATION DOSE REDUCTION: This exam was performed according to the departmental dose-optimization program  which includes automated exposure control, adjustment of the mA and/or kV according to patient size and/or use of iterative reconstruction technique. CONTRAST:  80mL OMNIPAQUE  IOHEXOL  350 MG/ML SOLN COMPARISON:  Chest x-ray 11/08/2023, CT chest 08/10/2023, 07/08/2023, 05/13/2023 and multiple prior exams FINDINGS: Cardiovascular: Satisfactory opacification of the pulmonary arteries  to the segmental level. No evidence of pulmonary embolism. Nonaneurysmal aorta. Moderate atherosclerosis. Coronary vascular calcification. Normal cardiac size. Small pericardial effusion Mediastinum/Nodes: Patent trachea. No suspicious thyroid  mass. Calcified mediastinal lymph nodes consistent with prior granulomatous disease. Similar mild mediastinal lymph nodes. Esophagus within normal limits. Lungs/Pleura: Large thick-walled cavitary mass at the right apex with internal debris, measures about 8.6 x 6.9 by 9.4 cm on series 5, image 38 and coronal series 7, image 104. Cavitary mass directly communicates with right upper lobe airway, series 5 image 52 through 56. Advanced emphysema. Multiple solid bilateral pulmonary nodules are again noted. Grossly stable distribution when compared with prior exam. Index irregular right lung base pulmonary nodule on series 5, image 124 measures 12 x 10 mm previously 12 x 8 mm. Index left lower lobe pulmonary nodule on series 5, image 82 measures 10 x 10 mm previously 11 x 10 mm. Upper Abdomen: No acute finding Musculoskeletal: Similar chronic irregularity of right first and second ribs. No acute osseous abnormality. Review of the MIP images confirms the above findings. IMPRESSION: 1. Negative for acute pulmonary embolus. 2. Large thick-walled cavitary mass at the right apex with internal debris, grossly stable. Cavitary mass directly communicates with right upper lobe airway as seen on prior exam. 3. Multiple solid bilateral pulmonary nodules, grossly stable distribution when compared with prior exam. Measured index nodules are grossly stable. 4. Advanced emphysema. 5. Aortic atherosclerosis. Aortic Atherosclerosis (ICD10-I70.0) and Emphysema (ICD10-J43.9). Electronically Signed   By: Luke Bun M.D.   On: 11/08/2023 20:17   DG Chest Portable 1 View Result Date: 11/08/2023 CLINICAL DATA:  Shortness of breath. EXAM: PORTABLE CHEST 1 VIEW COMPARISON:  February 09, 2023. FINDINGS:  The heart size and mediastinal contours are within normal limits. Right internal jugular Port-A-Cath is unchanged. Stable cavitary lesion is noted in right lung apex concerning for malignancy. Small pulmonary nodules may be present in both lungs as noted on prior exams. The visualized skeletal structures are unremarkable. IMPRESSION: Stable cavitary lesion seen in right lung apex concerning for malignancy. Small pulmonary nodules may be present bilaterally as described on prior studies. Electronically Signed   By: Lynwood Landy Raddle M.D.   On: 11/08/2023 17:36     Data Reviewed: Relevant notes from primary care and specialist visits, past discharge summaries as available in EHR, including Care Everywhere. Prior diagnostic testing as pertinent to current admission diagnoses Updated medications and problem lists for reconciliation ED course, including vitals, labs, imaging, treatment and response to treatment Triage notes, nursing and pharmacy notes and ED provider's notes Notable results as noted in HPI   Assessment and Plan: * COPD with acute exacerbation (HCC) Resistant MAC with possible superimposed infection Chronic respiratory failure with hypoxia Patient failing amikacin  and prednisone  outpatient CT chest without infiltrate Per recommendations on pulmonology note 11/07/2022: - IV antibiotics and IV corticosteroids--> cefepime  and vancomycin .  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 Inpatient status is judged to be reasonable and necessary in order to provide the required intensity of service to ensure the patient's safety. The patient's presenting symptoms, physical exam findings, and initial radiographic and laboratory data in the context of their chronic comorbidities is felt to place them at high risk for further clinical deterioration. Furthermore, it is not anticipated that the patient will be medically stable for discharge from the hospital within 2 midnights of admission.   * I certify that at the point of admission it is my clinical judgment that the patient will require inpatient hospital care spanning beyond 2 midnights from the point of admission due to high intensity of service, high risk for further deterioration and high frequency of surveillance required.*  Author: Delayne LULLA Solian, MD 11/08/2023 11:25 PM  For on call review [email or web address].

## 2023-11-08 NOTE — Assessment & Plan Note (Signed)
 Nicotine patch

## 2023-11-08 NOTE — Hospital Course (Addendum)
 Marland Kitchen

## 2023-11-08 NOTE — ED Provider Notes (Addendum)
 [facility] Provider Note    Event Date/Time   First MD Initiated Contact with Patient 11/08/23 1701     (approximate)   History   Shortness of Breath   HPI  Paul Carpenter is a 68 y.o. male with extensive past medical history including COPD, history of lung cancer, chronic MAC infection, CHF, coronary disease, here with shortness of breath.  The patient arrives after being sent in from his pulmonologist.  Reportedly has had worsening shortness of breath for the last week or so, he has been on multiple antibiotics without significant relief.  He has been trying steroids as well.  He had significant increased work of breathing despite this, and was sent in for admissiony.  He expresses significant fatigue related to his chronic medical conditions and is interested in speaking with palliative.  He would like medical treatment at this time however.  Denies any pain.  Denies any known fevers.  He said increased bleeding production.     Physical Exam   Triage Vital Signs: ED Triage Vitals [11/08/23 1654]  Encounter Vitals Group     BP (!) 151/9     Systolic BP Percentile      Diastolic BP Percentile      Pulse Rate 90     Resp (!) 33     Temp 98 F (36.7 C)     Temp Source Oral     SpO2 96 %     Weight 170 lb (77.1 kg)     Height 6' (1.829 m)     Head Circumference      Peak Flow      Pain Score 3     Pain Loc      Pain Education      Exclude from Growth Chart     Most recent vital signs: Vitals:   11/08/23 2136 11/08/23 2202  BP: (!) 136/103   Pulse: 79   Resp:    Temp:  98.1 F (36.7 C)  SpO2: 93%      General: Awake, no distress.  Mild increased work of breathing.  Alert and oriented.   CV:  Good peripheral perfusion.  Tachycardic. Resp:  Increased work of breathing with diffuse wheezing, diminished aeration bilaterally. Abd:  No distention.  Other:  No edema.   ED Results / Procedures / Treatments   Labs (all labs ordered are  listed, but only abnormal results are displayed) Labs Reviewed  CBC WITH DIFFERENTIAL/PLATELET - Abnormal; Notable for the following components:      Result Value   Neutro Abs 9.2 (*)    Lymphs Abs 0.3 (*)    All other components within normal limits  COMPREHENSIVE METABOLIC PANEL - Abnormal; Notable for the following components:   Sodium 131 (*)    Chloride 91 (*)    Glucose, Bld 103 (*)    Calcium  8.6 (*)    Total Protein 6.3 (*)    All other components within normal limits  LACTIC ACID, PLASMA - Abnormal; Notable for the following components:   Lactic Acid, Venous 2.1 (*)    All other components within normal limits  CULTURE, BLOOD (ROUTINE X 2)  CULTURE, BLOOD (ROUTINE X 2)  PROCALCITONIN  LACTIC ACID, PLASMA  BRAIN NATRIURETIC PEPTIDE  TROPONIN I (HIGH SENSITIVITY)  TROPONIN I (HIGH SENSITIVITY)     EKG Sinus tachycardia, ventricular at 91.  PR 50, QRS 175, QTc 612.  No acute ST elevations or depressions.   RADIOLOGY Chest  x-ray: Stable cavitary lesion in the right lung apex CT angio chest: Large, thick-walled cavitary mass in the right apex    I also independently reviewed and agree with radiologist interpretations.   PROCEDURES:  Critical Care performed: Yes, see critical care procedure note(s)  .Critical Care  Performed by: Angelena Smalls, MD Authorized by: Angelena Smalls, MD   Critical care provider statement:    Critical care time (minutes):  30   Critical care time was exclusive of:  Separately billable procedures and treating other patients   Critical care was necessary to treat or prevent imminent or life-threatening deterioration of the following conditions:  Cardiac failure, circulatory failure and respiratory failure   Critical care was time spent personally by me on the following activities:  Development of treatment plan with patient or surrogate, discussions with consultants, evaluation of patient's response to treatment, examination of  patient, ordering and review of laboratory studies, ordering and review of radiographic studies, ordering and performing treatments and interventions, pulse oximetry, re-evaluation of patient's condition and review of old charts     MEDICATIONS ORDERED IN ED: Medications  ipratropium-albuterol  (DUONEB) 0.5-2.5 (3) MG/3ML nebulizer solution 3 mL (3 mLs Nebulization Given 11/08/23 1748)  ipratropium-albuterol  (DUONEB) 0.5-2.5 (3) MG/3ML nebulizer solution 3 mL (3 mLs Nebulization Given 11/08/23 1748)  ipratropium-albuterol  (DUONEB) 0.5-2.5 (3) MG/3ML nebulizer solution 3 mL (3 mLs Nebulization Given 11/08/23 1748)  methylPREDNISolone  sodium succinate (SOLU-MEDROL ) 125 mg/2 mL injection 125 mg (125 mg Intravenous Given 11/08/23 1748)  sodium chloride  0.9 % bolus 1,000 mL (0 mLs Intravenous Stopped 11/08/23 1906)  ceFEPIme  (MAXIPIME ) 2 g in sodium chloride  0.9 % 100 mL IVPB (0 g Intravenous Stopped 11/08/23 2036)  vancomycin  (VANCOREADY) IVPB 1750 mg/350 mL (1,750 mg Intravenous New Bag/Given 11/08/23 2049)  iohexol  (OMNIPAQUE ) 350 MG/ML injection 80 mL (80 mLs Intravenous Contrast Given 11/08/23 1944)     IMPRESSION / MDM / ASSESSMENT AND PLAN / ED COURSE  I reviewed the triage vital signs and the nursing notes.                              Differential diagnosis includes, but is not limited to, COPD exacerbation, pneumonia, PE, CHF, pneumothorax, anemia  Patient's presentation is most consistent with acute presentation with potential threat to life or bodily function.  The patient is on the cardiac monitor to evaluate for evidence of arrhythmia and/or significant heart rate changes   68 year old male with history of severe COPD, chronic MAC infection, chronic hypoxia, here with acute on chronic shortness of breath.  Patient arrives dyspneic, tachycardic, concerning for acute on chronic COPD exacerbation versus pneumonia.  Patient's pulmonologist, Dr. Tamea, present and evaluated the patient in the ED.   Patient will be started on IV antibiotics, IV steroids, and given breathing treatments with supportive care.  She recommended a CT scan.  Otherwise, lab work shows normal lactic acid.  Troponin negative, do not suspect ACS.  CBC without major leukocytosis.  CMP is overall at baseline with normal renal function.  Procalcitonin slightly elevated at 0.35.  CT pending.  Admit to medicine.   FINAL CLINICAL IMPRESSION(S) / ED DIAGNOSES   Final diagnoses:  COPD exacerbation (HCC)  Acute on chronic hypoxic respiratory failure (HCC)     Rx / DC Orders   ED Discharge Orders     None        Note:  This document was prepared using Dragon voice recognition software and may  include unintentional dictation errors.   Angelena Smalls, MD 11/08/23 Paul Carpenter    Angelena Smalls, MD 11/08/23 [phone]

## 2023-11-08 NOTE — Assessment & Plan Note (Signed)
 Hyponatremia Stable

## 2023-11-08 NOTE — Progress Notes (Signed)
 Pharmacy Antibiotic Note  Paul Carpenter is a 68 y.o. male admitted on 11/08/2023 with pneumonia.  Pharmacy has been consulted for Vancomycin  dosing.  Plan: Pt given Vancomycin  1750 mg once. Vancomycin  1250 mg IV Q 12 hrs. Goal AUC 400-550. Expected AUC: 495.2 SCr used: 0.76, TBW 77.1 kg < IBW 77.6 kg  Pharmacy will continue to follow and will adjust abx dosing whenever warranted.  Temp (24hrs), Avg:97.9 F (36.6 C), Min:97.5 F (36.4 C), Max:98.1 F (36.7 C)   Recent Labs  Lab 11/08/23 1710 11/08/23 1711 11/08/23 2141  WBC 9.7  --   --   CREATININE 0.76  --   --   LATICACIDVEN  --  1.3 2.1*    Estimated Creatinine Clearance: 99.1 mL/min (by C-G formula based on SCr of 0.76 mg/dL).    Allergies  Allergen Reactions   Lisinopril Rash   Varenicline  Rash    Antimicrobials this admission: 1/07 Cefepime  >> x 5 days 1/07 Azithromycin  >> x 5 days 1/07 Vancomycin  >>   Microbiology results: 1/07 BCx: Pending 1/07 Sputum: Sent   Thank you for allowing pharmacy to be a part of this patient's care.  Rankin CANDIE Dills, PharmD, [facility] 11/08/2023 11:46 PM

## 2023-11-08 NOTE — Progress Notes (Addendum)
 Subjective:    Patient ID: Paul Carpenter, male    DOB: 1956-04-15, 68 y.o.   MRN: [id number]  Patient Care Team: Paul Manna, MD as PCP - General (Internal Medicine) Paul Selinda RAMAN, MD as Referring Physician (Vascular Surgery) Paul Evalene PARAS, MD as Consulting Physician (Oncology) Paul Aran, MD as Referring Physician (Radiation Oncology) Paul Dedra CROME, MD as Consulting Physician (Pulmonary Disease) Paul Bodily, MD as Consulting Physician (Infectious Diseases) Paul Vinie LOISE, MD as Referring Physician (Pulmonary Disease)  Chief Complaint  Patient presents with   Follow-up   Acute Visit    Progressive increasing dyspnea over the last 5 weeks.  Worse over the last 2 to 3 days.    BACKGROUND/INTERVAL:Paul Carpenter is a very complex 68 year old current smoker (1PPD, 150 PY) with a history as noted below, who presents for follow-up of a cavitary right upper lobe process in the setting of history of non-small cell carcinoma of the lung, Mycobacterium avium complex infection, stage II COPD and chronic dyspnea/fatigue.  This is an ACUTE visit.  He was last seen 29 August 2023, he is also followed at [facility] at the pulmonary specialty clinic for management of his RESISTANT MAC.  He continues to smoke 1 pack of cigarettes per day.  He has had progressive shortness of breath for the last 4 to 5 weeks worse over the last 2 to 3 days.  HPI Discussed the use of AI scribe software for clinical note transcription with the patient, who gave verbal consent to proceed.  History of Present Illness   The patient, a current smoker with a complex medical history, presents with acute worsening of dyspnea. He was evaluated at an urgent care facility the previous day, where a viral panel was negative. The patient reports a productive cough with green and occasionally blood-tinged sputum. Despite these symptoms, he continues to smoke one pack of cigarettes daily.  The patient's oxygen  levels have reportedly dropped to as low as 82%, prompting increased use of his home oxygen concentrator. He has been using his nebulizers regularly. Recently, he was started on a new medication, clofazimine , which he is uncertain if it may be contributing to his symptoms.  In addition to respiratory symptoms, the patient reports episodes of numbness, difficulty focusing, slurred speech, and shaking. These episodes are accompanied by a sensation of impending sweating, although actual sweating does not occur. These symptoms have occurred previously, prior to the initiation of the new medication.  The patient's mobility is limited, spending most of his time either on his porch smoking or lying in bed. This sedentary lifestyle has been ongoing for approximately five weeks. The patient's family member suspects that depression may be contributing to his lack of activity.   He has not had any chest pain, no orthopnea nor paroxysmal nocturnal dyspnea.  He has a very complex history with COPD stage III, ischemic cardiomyopathy, non-small cell carcinoma status post chemo/XRT and immunotherapy.  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  He also has had prior Pseudomonas aeruginosa pneumonia.  Currently followed at [facility] by Dr. India Paul due to his resistant MAC.  He was recently started on clofazimine  on experimental protocol for his MAC.  He is on Myambutol , azithromycin , and amikacin  as well for his MAC.  I spoke with Paul Carpenter yesterday via phone he is concerned about the patient's decline.  He had provided the patient with a prednisone  taper which has not been helpful to him this time around.  Patient has  failed outpatient therapy.  Patient was seen in the PFT lab prior to being seen in the office.  The patient looked acutely ill was only able to complete simple spirometry.  Received nebulization treatment with albuterol  without any significant relief.     Review of Systems A 10  point review of systems was performed and it is as noted above otherwise negative.   Patient Active Problem List   Diagnosis Date Noted   Hyponatremia 09/11/2022   Generalized weakness 09/10/2022   Shortness of breath 02/03/2022   SIADH (syndrome of inappropriate ADH production) (HCC) 09/22/2021   Overweight (BMI 25.0-29.9) 09/22/2021   Hypomagnesemia 09/21/2021   Elevated hemoglobin A1c 06/19/2020   Hypothyroidism, acquired 05/30/2019   Squamous cell lung cancer, right (HCC) 05/30/2019   HFrEF (heart failure with reduced ejection fraction) (HCC) 03/06/2019   Atherosclerosis 12/14/2018   Non-small cell lung cancer, right (HCC) 04/24/2018   Oral ulcer 02/16/2018   Pituitary disorder (HCC) 02/16/2018   Migraine headache 03/07/2017   HCAP (healthcare-associated pneumonia) 09/11/2016   COPD exacerbation (HCC) 09/11/2016   Chronic hyponatremia 09/11/2016   Leukocytosis 09/11/2016   Thrombocytopenia (HCC) 09/11/2016   Cigarette smoker 06/11/2016   Cervical radiculopathy 04/15/2016   Cervical disc disorder at C5-C6 level with radiculopathy 03/09/2016   Impingement syndrome of right shoulder 03/09/2016   Health care maintenance 09/29/2015   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   Benign essential hypertension 05/28/2015   Polycythemia 03/24/2015   Carotid artery disease (HCC) 12/12/2014   Disequilibrium 12/12/2014   Mixed hyperlipidemia 12/10/2014   Incomplete emptying of bladder 06/04/2014   Anxiety 05/18/2014   Chronic coronary artery disease 05/18/2014   Chronic headaches 05/18/2014   Acute shoulder pain 03/15/2014   Impingement syndrome of left shoulder 03/15/2014   Lung mass 12/06/2013   Kidney stone 11/24/2013   COPD (chronic obstructive pulmonary disease) (HCC) 04/24/2013   Obstructive sleep apnea 04/24/2013   Benign localized prostatic hyperplasia with lower urinary tract symptoms (LUTS) 07/04/2012   Encounter for long-term current use of medication 07/04/2012   ED (erectile  dysfunction) of organic origin 07/04/2012   Testicular hypofunction 07/04/2012    Social History   Tobacco Use   Smoking status: Every Day    Current packs/day: 3.00    Average packs/day: 3.0 packs/day for 50.0 years (150.0 ttl pk-yrs)    Types: Cigarettes    Passive exposure: Current   Smokeless tobacco: Never   Tobacco comments:    2 PPD 08/29/2023  Substance Use Topics   Alcohol use: Yes    Alcohol/week: 2.0 standard drinks of alcohol    Types: 2 Standard drinks or equivalent per week    Comment: moderate    Allergies  Allergen Reactions   Lisinopril Rash   Varenicline  Rash    Current Meds  Medication Sig   acetaminophen  (TYLENOL ) 500 MG tablet Take 500 mg by mouth daily as needed.   albuterol  (VENTOLIN  HFA) 108 (90 Base) MCG/ACT inhaler INHALE 2 PUFFS BY MOUTH EVERY 4 HOURS AS NEEDED FOR WHEEZE OR FOR SHORTNESS OF BREATH   amikacin  (AMIKIN ) 1 GM/4ML SOLN injection Inject into the muscle daily.   atorvastatin  (LIPITOR) 10 MG tablet Take 10 mg by mouth daily.   azithromycin  (ZITHROMAX ) 500 MG tablet TAKE 1 TABLET (500 MG TOTAL) BY MOUTH DAILY.   BREZTRI  AEROSPHERE 160-9-4.8 MCG/ACT AERO INHALE 2 PUFFS INTO THE LUNGS IN THE MORNING AND AT BEDTIME.   clofazimine  50 mg CAPS capsule (for compassionate use) Take 50 mg by mouth daily with  breakfast.   diazepam  (VALIUM ) 5 MG tablet Take 5 mg by mouth at bedtime as needed.   dronabinol  (MARINOL ) 5 MG capsule Take 1 capsule (5 mg total) by mouth 2 (two) times daily before lunch and supper.   ethambutol  (MYAMBUTOL ) 400 MG tablet Take 3 tablets (1,200 mg total) by mouth daily.   ferrous sulfate  325 (65 FE) MG tablet Take 325 mg by mouth at bedtime.   folic acid  (FOLVITE ) 1 MG tablet Take 1 mg by mouth daily.   hydrocortisone  (CORTEF ) 10 MG tablet Take two tablets in the morning. Take one tablet in the afternoon (between 2-4pm).   lidocaine -prilocaine  (EMLA ) cream APPLY 1 APPLICATION TOPICALLY AS NEEDED.   losartan (COZAAR) 25 MG  tablet Take 25 mg by mouth daily.   magnesium  oxide (MAG-OX) 400 MG tablet Take 600 mg by mouth 2 (two) times daily. Takes 1.5 tablet twice daily   melatonin 3 MG TABS tablet Take 3 mg by mouth at bedtime as needed.   metoprolol  succinate (TOPROL -XL) 25 MG 24 hr tablet Take 25 mg by mouth daily.   nicotine  (NICODERM CQ  - DOSED IN MG/24 HOURS) 21 mg/24hr patch Place 21 mg onto the skin daily.   nitroGLYCERIN  (NITROSTAT ) 0.4 MG SL tablet Place under the tongue.   OXYGEN Inhale 2 L into the lungs at bedtime.   pantoprazole  (PROTONIX ) 40 MG tablet Take 1 tablet (40 mg total) by mouth 2 (two) times daily before meals   predniSONE  (DELTASONE ) 20 MG tablet Take by mouth.  Take 2 tablets (40 mg total) by mouth daily for 3 days, THEN 1.5 tablets (30 mg total) daily for 3 days, THEN 1 tablet (20 mg total) daily for 3 days, THEN 0.5 tablets (10 mg total) daily for 3 days.    Instructions: Take 2 tablets (40 mg total) by mouth daily for 3 days, THEN 1.5 tablets (30 mg total) daily for 3 days, THEN 1 tablet (20 mg total) daily for 3 days, THEN 0.5 tablets (10 mg total) daily for 3 days.   sodium chloride  1 g tablet Take 1 tablet (1 g total) by mouth 3 (three) times daily with meals.   tadalafil  (CIALIS ) 5 MG tablet Take 1 tablet (5 mg total) by mouth daily as needed for erectile dysfunction.   [DISCONTINUED] ipratropium-albuterol  (DUONEB) 0.5-2.5 (3) MG/3ML SOLN Take 3 mLs by nebulization every 6 (six) hours as needed.    Immunization History  Administered Date(s) Administered   Fluad Quad(high Dose 65+) 09/22/2020   Influenza Inj Mdck Quad Pf 09/06/2016, 10/02/2019, 09/09/2021, 09/03/2022   Influenza Split 08/05/2013, 07/23/2014, 08/18/2015   Influenza Whole 08/01/2012   Influenza,inj,Quad PF,6+ Mos 08/14/2018   Influenza-Unspecified 09/06/2016, 08/17/2017, 09/09/2021   Zoster Recombinant(Shingrix) 06/19/2020        Objective:     BP 136/82 (BP Location: Right Arm, Cuff Size: Normal)   Pulse 89    Temp (!) 97.5 F (36.4 C)   Ht 6' (1.829 m)   Wt 170 lb 12.8 oz (77.5 kg)   SpO2 98%   BMI 23.16 kg/m   SpO2: 98 % O2 Device: None (Room air)  GENERAL: Chronically ill-appearing man, moderate respiratory distress.  Ambulatory but very weak.  Mild to moderate conversational dyspnea.  Mildly lethargic. HEAD: Normocephalic, atraumatic.  EYES: Pupils equal, round, reactive to light.  No scleral icterus.  MOUTH: Oral mucosa moist.  No thrush. NECK: Supple. No thyromegaly. Trachea midline. No JVD.  No adenopathy. PULMONARY: Good air entry bilaterally.  Coarse, amphoric sounds  on the right upper lung zone.  Diffuse wheezes noted (albuterol  treatment less than an hour ago).  Scattered rhonchi noted. CARDIOVASCULAR: S1 and S2. Regular rate and rhythm.  No rubs, murmurs or gallops heard. ABDOMEN: Benign. MUSCULOSKELETAL: No joint deformity, no clubbing, no edema.  NEUROLOGIC: Mild psychomotor retardation.  No overt focal deficit.  No gait disturbance.  Speech is fluent. SKIN: Intact,warm,dry. PSYCH: Depressed mood and flat affect.  Mild to moderate psychomotor retardation noted.   Assessment & Plan:     ICD-10-CM   1. COPD exacerbation (HCC)  J44.1     2. Stage 3 severe COPD by GOLD classification (HCC)  J44.9     3. Mycobacterium avium infection (HCC)  A31.0       Discussion:    Acute Exacerbation of Chronic Obstructive Pulmonary Disease (COPD) Worsening dyspnea, wheezing, and productive cough with purulent sputum. Chronic smoker with oxygen saturation dropping to 80-82%. Using nebulizer and oxygen concentrator at home. Reports green sputum and occasional hemoptysis. On prednisone  and amikacin . Requires further evaluation for potential superimposed infection. Discussed need for hospital admission for IV antibiotics and IV corticosteroids. Explained that continued smoking worsens prognosis and quitting could improve outlook, though uncertain due to never having stopped smoking.  Potential for continued deterioration due to resistant infection and lung scarring from previous cancer treatment. - Has failed outpatient management - Admit for IV antibiotics and IV corticosteroids - Administer breathing treatment - Discontinue amikacin  and other current medications as needed - Reassess medication regimen in hospital - Coordinate with emergency room for expedited admission  Lung Cancer Treated lung cancer with residual lung scarring impacting function. No current evidence of recurrence. - Monitor for signs of recurrence - Manage lung function and related symptoms  Depression Significant depression contributing to health decline. Largely immobile, spending most time lying down and smoking, impacting lung function. Under care of Dr. Jacobo and palliative care team. - Continue management with palliative care team - Encourage mobility and smoking cessation  General Health Maintenance Continues to smoke one pack per day, significantly impacting lung health and prognosis. Smoking cessation is crucial. - Strongly encourage smoking cessation - Provide resources and support for smoking cessation  Follow-up - Coordinate follow-up care with pulmonology and palliative care teams - Schedule follow-up appointments post-hospital discharge.     Recommend evaluation in the ED for admission.     I spent 40 minutes of dedicated to the care of this patient on the date of this encounter to include pre-visit review of records, face-to-face time with the patient discussing conditions above, post visit ordering of testing, clinical documentation with the electronic health record, making appropriate referrals as documented, and communicating necessary findings to members of the patients care team.     C. Leita Sanders, MD Advanced Bronchoscopy PCCM [facility]    *This note was generated using voice recognition software/Dragon and/or AI transcription program.   Despite best efforts to proofread, errors can occur which can change the meaning. Any transcriptional errors that result from this process are unintentional and may not be fully corrected at the time of dictation.    Addendum 1700 hrs. 11/08/2023:  Discussed with ED physician Dr. Angelena, recommend CT angio chest to rule out PE as clofazimine  can cause thromboembolism. Patient will need admission for management of refractory COPD exacerbation that has failed outpatient therapy.  Will need IVs with antipseudomonal coverage.  Patient will need pharmacy consultation to continue his MAC therapy.  Recommend also consultation with Dr. Fayette, ID, who  knows the patient with regards to his MAC.  Patient also has a history of cardiomyopathy, recommend limited echocardiogram to evaluate LVEF (had echocardiogram at [facility] on 10/2023 but results were not congruous with prior echo readings).  Current MAC therapy: Azithromycin  500 mg p.o. daily Ethambutol  1200 mg p.o. daily Amikacin  750 mg IV every 24 hours Clofazimine  50 mg daily, compassionate use, patient has his own med.  Should be evaluated by palliative care while in-house.  He has previously been evaluated by Fonda Mower, NP in this regard through medical oncology.   KYM Leita Sanders, MD Advanced Bronchoscopy PCCM [facility]    *This note was generated using voice recognition software/Dragon and/or AI transcription program.  Despite best efforts to proofread, errors can occur which can change the meaning. Any transcriptional errors that result from this process are unintentional and may not be fully corrected at the time of dictation.

## 2023-11-08 NOTE — Assessment & Plan Note (Addendum)
 Resistant MAC with possible superimposed infection Chronic respiratory failure with hypoxia Patient failing amikacin  and prednisone  outpatient CT chest without infiltrate Per recommendations on pulmonology note 11/07/2022: - IV antibiotics and IV corticosteroids--> cefepime  and vancomycin .  IV Solu-Medrol  - Administer breathing treatment--> DuoNebs every 6 with albuterol  as needed - Discontinue amikacin  and other current medications(-holding ethambutol , amikacin  and rifampin ) Flutter valve, guaifenesin  Continue supplemental oxygen

## 2023-11-08 NOTE — Assessment & Plan Note (Signed)
 Continue metoprolol.

## 2023-11-09 DIAGNOSIS — J9611 Chronic respiratory failure with hypoxia: Secondary | ICD-10-CM | POA: Diagnosis not present

## 2023-11-09 DIAGNOSIS — F17210 Nicotine dependence, cigarettes, uncomplicated: Secondary | ICD-10-CM

## 2023-11-09 DIAGNOSIS — E2740 Unspecified adrenocortical insufficiency: Secondary | ICD-10-CM | POA: Insufficient documentation

## 2023-11-09 DIAGNOSIS — J9621 Acute and chronic respiratory failure with hypoxia: Secondary | ICD-10-CM

## 2023-11-09 DIAGNOSIS — A310 Pulmonary mycobacterial infection: Secondary | ICD-10-CM | POA: Diagnosis not present

## 2023-11-09 DIAGNOSIS — J441 Chronic obstructive pulmonary disease with (acute) exacerbation: Secondary | ICD-10-CM | POA: Diagnosis not present

## 2023-11-09 LAB — RESPIRATORY PANEL BY PCR

## 2023-11-09 LAB — CBC
HCT: 38.9 % — ABNORMAL LOW (ref 39.0–52.0)
Hemoglobin: 13.3 g/dL (ref 13.0–17.0)
MCH: 31.1 pg (ref 26.0–34.0)
MCHC: 34.2 g/dL (ref 30.0–36.0)
MCV: 91.1 fL (ref 80.0–100.0)
Platelets: 204 10*3/uL (ref 150–400)
RBC: 4.27 MIL/uL (ref 4.22–5.81)
RDW: 11.9 % (ref 11.5–15.5)
WBC: 7.5 10*3/uL (ref 4.0–10.5)
nRBC: 0 % (ref 0.0–0.2)

## 2023-11-09 LAB — EXPECTORATED SPUTUM ASSESSMENT W GRAM STAIN, RFLX TO RESP C: Special Requests: NORMAL

## 2023-11-09 LAB — BASIC METABOLIC PANEL
Anion gap: 10 (ref 5–15)
BUN: 11 mg/dL (ref 8–23)
CO2: 29 mmol/L (ref 22–32)
Calcium: 8.7 mg/dL — ABNORMAL LOW (ref 8.9–10.3)
Chloride: 96 mmol/L — ABNORMAL LOW (ref 98–111)
Creatinine, Ser: 0.66 mg/dL (ref 0.61–1.24)
GFR, Estimated: >60 mL/min (ref >60)
Glucose, Bld: 112 mg/dL — ABNORMAL HIGH (ref 70–99)
Potassium: 4.2 mmol/L (ref 3.5–5.1)
Sodium: 135 mmol/L (ref 135–145)

## 2023-11-09 LAB — HIV ANTIBODY (ROUTINE TESTING W REFLEX): HIV Screen 4th Generation wRfx: NONREACTIVE

## 2023-11-09 LAB — STREP PNEUMONIAE URINARY ANTIGEN: Strep Pneumo Urinary Antigen: NEGATIVE

## 2023-11-09 LAB — MRSA NEXT GEN BY PCR, NASAL: MRSA by PCR Next Gen: NOT DETECTED

## 2023-11-09 MED ORDER — ALBUTEROL SULFATE (2.5 MG/3ML) 0.083% IN NEBU
2.5000 mg | INHALATION_SOLUTION | RESPIRATORY_TRACT | Status: DC | PRN
  Administered 2023-11-10 – 2023-11-14 (×10): 2.5 mg via RESPIRATORY_TRACT
  Filled 2023-11-09 (×10): qty 3

## 2023-11-09 MED ORDER — ETHAMBUTOL HCL 400 MG PO TABS
1200.0000 mg | ORAL_TABLET | Freq: Every day | ORAL | Status: DC
  Administered 2023-11-10 – 2023-11-14 (×5): 1200 mg via ORAL
  Filled 2023-11-09 (×5): qty 3

## 2023-11-09 MED ORDER — REVEFENACIN 175 MCG/3ML IN SOLN
175.0000 ug | Freq: Every day | RESPIRATORY_TRACT | Status: DC
  Administered 2023-11-09 – 2023-11-14 (×6): 175 ug via RESPIRATORY_TRACT
  Filled 2023-11-09 (×6): qty 3

## 2023-11-09 MED ORDER — METOPROLOL SUCCINATE ER 25 MG PO TB24: 25.0000 mg | ORAL_TABLET | ORAL | Status: DC | PRN

## 2023-11-09 MED ORDER — DEXTROSE 5 % IV SOLN
750.0000 mg | INTRAVENOUS | Status: DC
  Administered 2023-11-10 – 2023-11-14 (×5): 750 mg via INTRAVENOUS
  Filled 2023-11-09 (×6): qty 3

## 2023-11-09 MED ORDER — ETHAMBUTOL HCL 400 MG PO TABS
1200.0000 mg | ORAL_TABLET | Freq: Every day | ORAL | Status: DC
Start: 2023-11-09 — End: 2023-11-09

## 2023-11-09 MED ORDER — METHYLPREDNISOLONE SODIUM SUCC 40 MG IJ SOLR
40.0000 mg | Freq: Two times a day (BID) | INTRAMUSCULAR | Status: DC
  Administered 2023-11-09 – 2023-11-14 (×11): 40 mg via INTRAVENOUS
  Filled 2023-11-09 (×12): qty 1

## 2023-11-09 MED ORDER — CLOFAZIMINE 50 MG PO CAPS - FOR COMPASSIONATE USE: 100.0000 mg | ORAL_CAPSULE | Freq: Every day | ORAL | Status: DC

## 2023-11-09 MED ORDER — CLOFAZIMINE 50 MG PO CAPS - FOR COMPASSIONATE USE
100.0000 mg | ORAL_CAPSULE | Freq: Every day | ORAL | Status: DC
  Administered 2023-11-09 – 2023-11-14 (×6): 100 mg via ORAL
  Filled 2023-11-09 (×7): qty 2

## 2023-11-09 MED ORDER — BUDESONIDE 0.5 MG/2ML IN SUSP
0.5000 mg | Freq: Two times a day (BID) | RESPIRATORY_TRACT | Status: DC
  Administered 2023-11-09 – 2023-11-14 (×10): 0.5 mg via RESPIRATORY_TRACT
  Filled 2023-11-09 (×10): qty 2

## 2023-11-09 MED ORDER — ARFORMOTEROL TARTRATE 15 MCG/2ML IN NEBU
15.0000 ug | INHALATION_SOLUTION | Freq: Two times a day (BID) | RESPIRATORY_TRACT | Status: DC
Start: 2023-11-09 — End: 2023-11-14
  Administered 2023-11-09 – 2023-11-14 (×10): 15 ug via RESPIRATORY_TRACT
  Filled 2023-11-09 (×12): qty 2

## 2023-11-09 MED ORDER — HYDROCOD POLI-CHLORPHE POLI ER 10-8 MG/5ML PO SUER
5.0000 mL | Freq: Two times a day (BID) | ORAL | Status: DC | PRN
  Administered 2023-11-10 – 2023-11-14 (×7): 5 mL via ORAL
  Filled 2023-11-09 (×7): qty 5

## 2023-11-09 MED ORDER — CLOTRIMAZOLE 10 MG MT TROC: 10.0000 mg | Freq: Three times a day (TID) | OROMUCOSAL | Status: DC

## 2023-11-09 MED ORDER — LEVOTHYROXINE SODIUM 50 MCG PO TABS
175.0000 ug | ORAL_TABLET | Freq: Every day | ORAL | Status: DC
  Administered 2023-11-10 – 2023-11-14 (×5): 175 ug via ORAL
  Filled 2023-11-09 (×5): qty 1

## 2023-11-09 MED ORDER — ATORVASTATIN CALCIUM 20 MG PO TABS
10.0000 mg | ORAL_TABLET | Freq: Every day | ORAL | Status: DC
  Administered 2023-11-09 – 2023-11-14 (×6): 10 mg via ORAL
  Filled 2023-11-09 (×6): qty 1

## 2023-11-09 NOTE — Progress Notes (Addendum)
 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  For the details of that visit please refer to that note.  The patient is also followed at [facility] (Dr. India Man) for management of his resistant MAC.  On evaluation yesterday the patient was noted to be in moderate respiratory distress with significant bronchospasm noted.  He had failed outpatient therapy for COPD exacerbation (prednisone  taper and antibiotics ordered by [facility]).  Recently he was started on clofazimine  on compassionate use protocol for his MAC.  He is also on azithromycin , amikacin  and ethambutol .  The patient was referred for evaluation to ED for subsequent admission.  CT angio chest performed yesterday did not show pulmonary emboli.  The patient does have SEVERE emphysema/COPD noted on the scan.  He has the aforementioned cavitary process of his right upper lobe that is unchanged from prior.  There are lung nodules noted that are related to his MAC these do show waxing and waning pattern of size change.  Overall the nodules are stable.  The patient has been in progressive decline for over a year.  Despite his many comorbidities he continues to smoke 1 to 2 packs of cigarettes per day.  I was asked to follow-up with the patient at the request of Dr. Ban, I have discussed the case thoroughly with him.  On reevaluation today, the  patient appears to be somewhat improved with regards to work of breathing.  He continues to have significant cough and dyspnea.  I have reviewed his current medications thoroughly.   Pertinent  Medical History  - Non-small cell carcinoma stage IIIa Recurrence in 2019 status post chemo/radiation (right upper lobe) followed by Durvalumab  therapy for 1 year completed in 2020 - Stage III COPD - Nocturnal hypoxemia due to emphysema on nocturnal oxygen - Ischemic cardiomyopathy - Resistant Mycobacterium Avium Complex infection - Multiple lung nodules - Cavitary process right upper lobe progressive since 2020 (post radiation/infection) - Secondary polycythemia - Anxiety and depression - Tobacco dependency due to cigarettes - History of alcohol abuse - SIADH with attendant hyponatremia  Significant Hospital Events: Including procedures, antibiotic start and stop dates in addition to other pertinent events   01/07 admitted for management of refractory COPD exacerbation, CT angio chest no PE 01/08 pulmonary asked to follow-up  Interim History / Subjective:  Persistent cough his main complaint today.  Shortness of breath persistent however feels somewhat improved.  Felt hot with antibiotic infusion last evening suspect due to vancomycin .  Objective   Blood pressure (!) 151/91, pulse 97, temperature 98.3 F (36.8 C), temperature source Oral, resp. rate (!) 25, height 6' (1.829 m), weight 77.1 kg, SpO2 98%.    SpO2: 98 %     Intake/Output Summary (Last 24 hours) at 11/09/2023 1349 Last data filed at 11/09/2023 1106 Gross per 24 hour  Intake 1000 ml  Output 1950 ml  Net -950 ml   Filed Weights   11/08/23 1654  Weight: 77.1 kg    Examination: GENERAL: Chronically ill-appearing man, mild respiratory distress.  In chair.  Mild conversational dyspnea.  Awake and alert.   HEAD: Normocephalic, atraumatic.  EYES: Pupils equal, round, reactive to light.  No scleral icterus.  MOUTH: Oral  mucosa moist.  No thrush. NECK: Supple. No thyromegaly. Trachea midline. No JVD.  No adenopathy. PULMONARY: Good air entry bilaterally. Amphoric sounds on the right upper lung zone.  Diffuse wheezes noted.  No rhonchi noted. CARDIOVASCULAR: S1 and S2. Regular rate and rhythm.  No rubs, murmurs or gallops heard. ABDOMEN: Benign. MUSCULOSKELETAL: No joint deformity, no clubbing, no edema.  NEUROLOGIC: No overt focal deficit. Speech is fluent. SKIN: Intact,warm,dry. PSYCH: Depressed mood and flat affect.  Mild to moderate psychomotor retardation noted.   Resolved Hospital Problem list     Assessment & Plan:  COPD exacerbation, refractory Hx: Stage III COPD, resistant MAC infection query superimposed infection Optimize nebulization treatments Brovana /Pulmicort  twice a day via neb Yupelri  once a day via neb Albuterol  as needed Continue IV steroids Continue antibiotics MRSA screen negative consider DC vancomycin  Continue cefepime   Acute on chronic respiratory failure with hypoxia Oxygen supplementation to keep oxygen saturations between 90 to 92% Pulmonary hygiene  Resistant MAC infection Continue therapy ID to follow  Ischemic cardiomyopathy Limited echocardiogram, eval LVEF  Non-small cell lung cancer No obvious evidence of recurrence Residual cavitary lesion postradiation therapy  Tobacco dependence due to cigarettes Has not been motivated to quit Discussed importance of discontinuation of smoking  End-of-life discussion Recommend assessment by palliative care   Best Practice (right click and Reselect all SmartList Selections daily)   Diet/type: Regular consistency (see orders) DVT prophylaxis LMWH Pressure ulcer(s): N/A GI prophylaxis: PPI Lines: Central line implanted port on right (Port-A-Cath) Foley:  N/A Code Status:  full code continue discussions to establish goals of care Last date of multidisciplinary goals of care discussion [11/08/2023 discussed with  patient, wife and son during office visit.  11/09/2023 discussed with patient and daughter who was present at bedside]  Labs   CBC: Recent Labs  Lab 11/08/23 1710 11/09/23 0555  WBC 9.7 7.5  NEUTROABS 9.2*  --   HGB 14.3 13.3  HCT 41.3 38.9*  MCV 92.0 91.1  PLT 213 204    Basic Metabolic Panel: Recent Labs  Lab 11/08/23 1710 11/09/23 0555  NA 131* 135  K 3.9 4.2  CL 91* 96*  CO2 32 29  GLUCOSE 103* 112*  BUN 12 11  CREATININE 0.76 0.66  CALCIUM  8.6* 8.7*   GFR: Estimated Creatinine Clearance: 99.1 mL/min (by C-G formula based on SCr of 0.66 mg/dL). Recent Labs  Lab 11/08/23 1710 11/08/23 1711 11/08/23 2141 11/09/23 0555  PROCALCITON 0.35  --   --   --   WBC 9.7  --   --  7.5  LATICACIDVEN  --  1.3 2.1*  --     Liver Function Tests: Recent Labs  Lab 11/08/23 1710  AST 19  ALT 17  ALKPHOS 77  BILITOT 0.6  PROT 6.3*  ALBUMIN 3.8   No results for input(s): LIPASE, AMYLASE in the last 168 hours. No results for input(s): AMMONIA in the last 168 hours.  ABG No results found for: PHART, PCO2ART, PO2ART, HCO3, TCO2, ACIDBASEDEF, O2SAT   Coagulation Profile: No results for input(s): INR, PROTIME in the last 168 hours.  Cardiac Enzymes: No results for input(s): CKTOTAL, CKMB, CKMBINDEX, TROPONINI in the last 168 hours.  HbA1C: No results found for: HGBA1C  CBG: No results for input(s): GLUCAP in the last 168 hours.  Review of Systems:   A 10 point review of systems was performed and it is as noted above otherwise negative.  Past Medical History:  He,  has a past medical history of Anginal pain (HCC), Anxiety, Asthma, Chest pain, CHF (congestive heart failure) (HCC), Chicken pox, Complication of anesthesia, COPD (chronic obstructive pulmonary disease) (HCC), Coronary artery disease, Cough, Dysrhythmia, GERD (gastroesophageal reflux disease), Hematochezia, Hemorrhoids, History of chickenpox, History of colon polyps,  History of Helicobacter pylori infection, Hoarseness, Hypertension, Lung cancer (HCC) (05/2016), Migraines, OSA (obstructive sleep apnea), Personal history of tobacco use, presenting hazards to health (03/05/2016), Pneumonia, Raynaud disease, Raynaud disease, Raynaud's disease, Rotator cuff tear, Shortness of breath dyspnea, Sleep apnea, and Ulcer (traumatic) of oral mucosa.   Surgical History:   Past Surgical History:  Procedure Laterality Date   BACK SURGERY     cervical fusion x 2   CARDIAC CATHETERIZATION     CERVICAL DISCECTOMY     x 2   COLONOSCOPY     COLONOSCOPY N/A 07/25/2015   Procedure: COLONOSCOPY;  Surgeon: Gladis RAYMOND Mariner, MD;  Location: [facility] [place] ENDOSCOPY;  Service: Endoscopy;  Laterality: N/A;   COLONOSCOPY WITH PROPOFOL  N/A 10/04/2017   Procedure: COLONOSCOPY WITH PROPOFOL ;  Surgeon: Mariner Gladis RAYMOND, MD;  Location: [facility] ENDOSCOPY;  Service: Endoscopy;  Laterality: N/A;   COLONOSCOPY WITH PROPOFOL  N/A 07/10/2020   Procedure: COLONOSCOPY WITH PROPOFOL ;  Surgeon: Toledo, Ladell POUR, MD;  Location: [facility] ENDOSCOPY;  Service: Gastroenterology;  Laterality: N/A;   ELECTROMAGNETIC NAVIGATION BROCHOSCOPY Left 06/28/2016   Procedure: ELECTROMAGNETIC NAVIGATION BRONCHOSCOPY;  Surgeon: Jorie Cha, MD;  Location: [facility] ORS;  Service: Cardiopulmonary;  Laterality: Left;   ENDOBRONCHIAL ULTRASOUND N/A 04/11/2018   Procedure: ENDOBRONCHIAL ULTRASOUND;  Surgeon: Isaiah Scrivener, MD;  Location: [facility] ORS;  Service: Cardiopulmonary;  Laterality: N/A;   ESOPHAGOGASTRODUODENOSCOPY N/A 07/25/2015   Procedure: ESOPHAGOGASTRODUODENOSCOPY (EGD);  Surgeon: Gladis RAYMOND Mariner, MD;  Location: [facility] ENDOSCOPY;  Service: Endoscopy;  Laterality: N/A;   ESOPHAGOGASTRODUODENOSCOPY (EGD) WITH PROPOFOL  N/A 07/10/2020   Procedure: ESOPHAGOGASTRODUODENOSCOPY (EGD) WITH PROPOFOL ;  Surgeon: Toledo, Ladell POUR, MD;  Location: [facility] ENDOSCOPY;  Service: Gastroenterology;  Laterality: N/A;   NASAL SINUS SURGERY     x 2    PORTA  CATH INSERTION N/A 04/24/2018   Procedure: PORTA CATH INSERTION;  Surgeon: Marea Selinda RAMAN, MD;  Location: [facility] INVASIVE CV LAB;  Service: Cardiovascular;  Laterality: N/A;   rotator cuff surgery Right    07/2016   SEPTOPLASTY     SKIN GRAFT       Social History:   reports that he has been smoking cigarettes. He has a 150 pack-year smoking history. He has been exposed to tobacco smoke. He has never used smokeless tobacco. He reports that he does not currently use alcohol after a past usage of about 2.0 standard drinks of alcohol per week. He reports that he does not use drugs.   Family History:  His family history includes Heart attack (age of onset: 36) in his maternal grandfather; Heart disease in his father and paternal grandmother; Prostate cancer in his father. There is no history of Kidney cancer, Bladder Cancer, or Other.   Allergies Allergies  Allergen Reactions   Lisinopril Rash   Varenicline  Rash     Home Medications  Prior to Admission medications   Medication Sig Start Date End Date Taking? Authorizing Provider  acetaminophen  (TYLENOL ) 500 MG tablet Take 500 mg by mouth daily as needed.  Yes [provider]  albuterol  (PROVENTIL ) (2.5 MG/3ML) 0.083% nebulizer solution TAKE 3 MLS (2.5 MG TOTAL) BY NEBULIZATION 4 (FOUR) TIMES DAILY AS NEEDED FOR WHEEZING OR SHORTNESS OF BREATH. 08/31/23 08/30/24 Yes Tamea Dedra CROME, MD  albuterol  (VENTOLIN  HFA) 108 (90 Base) MCG/ACT inhaler INHALE 2 PUFFS BY MOUTH EVERY 4 HOURS AS NEEDED FOR WHEEZE OR FOR SHORTNESS OF BREATH 07/05/23  Yes Tamea Dedra CROME, MD  amikacin  (AMIKIN ) 1 GM/4ML SOLN injection Inject into the muscle daily. 10/30/23  Yes [provider]  atorvastatin  (LIPITOR) 10 MG tablet Take 10 mg by mouth daily. 08/17/22  Yes [provider]  azithromycin  (ZITHROMAX ) 500 MG tablet TAKE 1 TABLET (500 MG TOTAL) BY MOUTH DAILY. 10/04/23  Yes Fayette Bodily, MD  BREZTRI  AEROSPHERE 160-9-4.8 MCG/ACT AERO  INHALE 2 PUFFS INTO THE LUNGS IN THE MORNING AND AT BEDTIME. 05/20/23  Yes Tamea Dedra CROME, MD  clofazimine  50 mg CAPS capsule (for compassionate use) Take 100 mg by mouth daily with breakfast.   Yes [provider]  clotrimazole  (MYCELEX ) 10 MG troche Take 10 mg by mouth 3 (three) times daily.   Yes [provider]  diazepam  (VALIUM ) 5 MG tablet Take 5 mg by mouth at bedtime as needed. 11/30/21  Yes [provider]  ethambutol  (MYAMBUTOL ) 400 MG tablet Take 3 tablets (1,200 mg total) by mouth daily. 02/21/23  Yes Fayette Bodily, MD  ferrous sulfate  325 (65 FE) MG tablet Take 325 mg by mouth at bedtime.   Yes [provider]  folic acid  (FOLVITE ) 1 MG tablet Take 1 mg by mouth daily. 10/18/23 10/17/24 Yes [provider]  hydrocortisone  (CORTEF ) 10 MG tablet Take two tablets in the morning. Take one tablet in the afternoon (between 2-4pm). 03/03/23  Yes [provider]  levothyroxine  (SYNTHROID ) 175 MCG tablet Take 175 mcg by mouth daily before breakfast.   Yes [provider]  lidocaine -prilocaine  (EMLA ) cream APPLY 1 APPLICATION TOPICALLY AS NEEDED. 04/27/23  Yes [name], Evalene PARAS, MD  magnesium  oxide (MAG-OX) 400 MG tablet Take 800 mg by mouth 2 (two) times daily.   Yes [provider]  melatonin 3 MG TABS tablet Take 3 mg by mouth at bedtime as needed. 05/30/23  Yes [provider]  metoprolol  succinate (TOPROL -XL) 25 MG 24 hr tablet Take 12.5-25 mg by mouth daily as needed (Acute High Blood Pressure). 07/25/23 07/24/24 Yes [provider]  nicotine  (NICODERM CQ  - DOSED IN MG/24 HOURS) 21 mg/24hr patch Place 21 mg onto the skin daily. 06/13/23  Yes [provider]  nitroGLYCERIN  (NITROSTAT ) 0.4 MG SL tablet Place under the tongue. 04/07/21  Yes [provider]  pantoprazole  (PROTONIX ) 40 MG tablet Take 1 tablet (40 mg total) by mouth 2 (two) times daily before meals 02/15/22  Yes [provider]  predniSONE  (DELTASONE ) 20 MG tablet Take by mouth.  Take 2 tablets (40 mg total) by mouth daily for 3 days, THEN 1.5 tablets (30 mg total) daily for 3 days, THEN 1 tablet (20 mg total) daily for 3 days, THEN 0.5 tablets (10 mg total) daily for 3 days.    Instructions: Take 2 tablets (40 mg total) by mouth daily for 3 days, THEN 1.5 tablets (30 mg total) daily for 3 days, THEN 1 tablet (20 mg total) daily for 3 days, THEN 0.5 tablets (10 mg total) daily for 3 days. 11/01/23 11/13/23 Yes [provider]  sodium chloride  1 g tablet Take 1 tablet (1 g total) by  mouth 3 (three) times daily with meals. Patient taking differently: Take 5 g by mouth 3 (three) times daily with meals. 09/22/21  Yes Krishnan, Sendil K, MD  tadalafil  (CIALIS ) 5 MG tablet Take 1 tablet (5 mg total) by mouth daily as needed for erectile dysfunction. 12/30/22  Yes McGowan, Clotilda A, PA-C  dronabinol  (MARINOL ) 5 MG capsule Take 1 capsule (5 mg total) by mouth 2 (two) times daily before lunch and supper. Patient not taking: Reported on 11/09/2023 11/23/22   Jacobo Evalene PARAS, MD  losartan (COZAAR) 25 MG tablet Take 25 mg by mouth daily. Patient not taking: Reported on 11/09/2023 07/25/23 07/24/24  [provider]  OXYGEN Inhale 2 L into the lungs at bedtime.    [provider]  rifampin  (RIFADIN ) 300 MG capsule Take 2 capsules (600 mg total) by mouth daily. Patient not taking: Reported on 11/08/2023 02/21/23   Fayette Bodily, MD  ipratropium-albuterol  (DUONEB) 0.5-2.5 (3) MG/3ML SOLN Take 3 mLs by nebulization every 6 (six) hours as needed. 01/22/22 02/09/22  Borders, Fonda SAUNDERS, NP  levalbuterol  (XOPENEX  HFA) 45 MCG/ACT inhaler Inhale 1 puff into the lungs every 6 (six) hours as needed for wheezing. 11/19/21 02/09/22  Kasa, Kurian, MD     Level 3 follow up    Discussed with Dr. Devaughn Ban via phone conversation.  See orders for details   I spent 60 minutes of dedicated to the care of this  patient on the date of this encounter to include pre-visit review of records, face-to-face time with the patient discussing conditions above, post visit ordering of medications/interventions, clinical documentation with the electronic health record, making appropriate referrals as documented, and communicating necessary findings to members of the patients care team.   C. Leita Sanders, MD Advanced [facility]ch[facility]    *This note was generated using voice recognition software/Dragon and/or AI transcription program.  Despite best efforts to proofread, errors can occur which can change the meaning. Any transcriptional errors that result from this process are unintentional and may not be fully corrected at the time of dictation.

## 2023-11-09 NOTE — ED Notes (Signed)
Pt provided with warm blankets per request

## 2023-11-09 NOTE — Consult Note (Signed)
 NAME: Paul Carpenter  DOB: 1956/03/07  MRN: [id number]  Date/Time: 11/09/2023 10:57 AM  REQUESTING PROVIDER: Dr.Wouk Subjective:  REASON FOR CONSULT: MAC infection ? Paul Carpenter is a 68 y.o. 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 He did not improve much after discharge, was given steroids tapering dose and it did not help much He still has cough , greenish in color sometimes strawberry color  11/08/23 16:54  BP 151/9 (L)  Temp 98 F (36.7 C)  Pulse Rate 90  Resp 33 (H)  SpO2 96 %    Latest Reference Range & Units 11/08/23 17:10  WBC 4.0 - 10.5 K/uL 9.7  Hemoglobin 13.0 - 17.0 g/dL 85.6  HCT 60.9 - 47.9 % 41.3  Platelets 150 - 400 K/uL 213  Creatinine 0.61 - 1.24 mg/dL 9.23   CTA - n PE- showed the cavitary rt upper lobe Blood culture sent Sputum culture sent Started on vanco and cefepime  and azithromycin  I am asked to see patient for MAC infection MRSA nares negative   Past Medical History:  Diagnosis Date   Anginal pain (HCC)    Anxiety    Asthma    Chest pain    CHF (congestive heart failure) (HCC)    Chicken pox    Complication of anesthesia    o2 dropped after neck fusion   COPD (chronic obstructive pulmonary disease) (HCC)    Coronary artery disease    Cough    chronic  clear phlegm   Dysrhythmia    palpitations   GERD (gastroesophageal reflux disease)    h/o reflux/ hoarsness   Hematochezia     Hemorrhoids    History of chickenpox    History of colon polyps    History of Helicobacter pylori infection    Hoarseness    Hypertension    Lung cancer (HCC) 05/2016   Chemo + rad tx's.    Migraines    OSA (obstructive sleep apnea)    has CPAP but does not use   Personal history of tobacco use, presenting hazards to health 03/05/2016   Pneumonia    5/17   Raynaud disease    Raynaud disease    Raynaud's disease    Rotator cuff tear    on right   Shortness of breath dyspnea    Sleep apnea    Ulcer (traumatic) of oral mucosa     Past Surgical History:  Procedure Laterality Date   BACK SURGERY     cervical fusion x 2   CARDIAC CATHETERIZATION     CERVICAL DISCECTOMY     x 2   COLONOSCOPY     COLONOSCOPY N/A 07/25/2015   Procedure: COLONOSCOPY;  Surgeon: Gladis RAYMOND Mariner, MD;  Location: [facility] ENDOSCOPY;  Service: Endoscopy;  Laterality: N/A;   COLONOSCOPY WITH PROPOFOL  N/A 10/04/2017   Procedure: COLONOSCOPY WITH PROPOFOL ;  Surgeon: Mariner Gladis RAYMOND, MD;  Location: [facility] ENDOSCOPY;  Service: Endoscopy;  Laterality: N/A;   COLONOSCOPY WITH PROPOFOL  N/A 07/10/2020   Procedure: COLONOSCOPY WITH PROPOFOL ;  Surgeon:  Toledo, Ladell POUR, MD;  Location: [facility] ENDOSCOPY;  Service: Gastroenterology;  Laterality: N/A;   ELECTROMAGNETIC NAVIGATION BROCHOSCOPY Left 06/28/2016   Procedure: ELECTROMAGNETIC NAVIGATION BRONCHOSCOPY;  Surgeon: Jorie Cha, MD;  Location: [facility] ORS;  Service: Cardiopulmonary;  Laterality: Left;   ENDOBRONCHIAL ULTRASOUND N/A 04/11/2018   Procedure: ENDOBRONCHIAL ULTRASOUND;  Surgeon: Isaiah Scrivener, MD;  Location: [facility] ORS;  Service: Cardiopulmonary;  Laterality: N/A;   ESOPHAGOGASTRODUODENOSCOPY N/A 07/25/2015   Procedure: ESOPHAGOGASTRODUODENOSCOPY (EGD);  Surgeon: Gladis RAYMOND Mariner, MD;  Location: [facility] ENDOSCOPY;  Service: Endoscopy;  Laterality: N/A;   ESOPHAGOGASTRODUODENOSCOPY (EGD) WITH PROPOFOL  N/A 07/10/2020   Procedure: ESOPHAGOGASTRODUODENOSCOPY (EGD) WITH  PROPOFOL ;  Surgeon: Toledo, Ladell POUR, MD;  Location: [facility] ENDOSCOPY;  Service: Gastroenterology;  Laterality: N/A;   NASAL SINUS SURGERY     x 2    PORTA CATH INSERTION N/A 04/24/2018   Procedure: PORTA CATH INSERTION;  Surgeon: Marea Selinda RAMAN, MD;  Location: [facility] INVASIVE CV LAB;  Service: Cardiovascular;  Laterality: N/A;   rotator cuff surgery Right    07/2016   SEPTOPLASTY     SKIN GRAFT      Social History   Socioeconomic History   Marital status: Married    Spouse name: Not on file   Number of children: 3   Years of education: Not on file   Highest education level: Not on file  Occupational History   Occupation: Holiday Representative Work  Tobacco Use   Smoking status: Every Day    Current packs/day: 3.00    Average packs/day: 3.0 packs/day for 50.0 years (150.0 ttl pk-yrs)    Types: Cigarettes    Passive exposure: Current   Smokeless tobacco: Never   Tobacco comments:    2 PPD 08/29/2023  Vaping Use   Vaping status: Never Used  Substance and Sexual Activity   Alcohol use: Not Currently    Alcohol/week: 2.0 standard drinks of alcohol    Types: 2 Standard drinks or equivalent per week    Comment: moderate   Drug use: No   Sexual activity: Not on file  Other Topics Concern   Not on file  Social History Narrative   Not on file   Social Drivers of Health   Financial Resource Strain: Low Risk  (10/24/2023)   Received from [facility]   Overall Financial Resource Strain (CARDIA)    Difficulty of Paying Living Expenses: Not hard at all  Food Insecurity: No Food Insecurity (10/24/2023)   Received from [facility]   Hunger Vital Sign    Worried About Running Out of Food in the Last Year: Never true    Ran Out of Food in the Last Year: Never true  Transportation Needs: No Transportation Needs (10/24/2023)   Received from [facility] - Transportation    In the past 12 months, has lack of transportation kept you  from medical appointments or from getting medications?: No    Lack of Transportation (Non-Medical): No  Physical Activity: Inactive (10/04/2023)   Received from [facility]   Exercise Vital Sign    Days of Exercise per Week: 0 days    Minutes of Exercise per Session: 0 min  Stress: No Stress Concern Present (10/04/2023)   Received from [facility] - Occupational Stress Questionnaire    Feeling of Stress : Not at all  Social Connections: Moderately Integrated (10/04/2023)   Received from [facility]   Social  Connection and Isolation Panel [NHANES]    Frequency of Communication with Friends and Family: More than three times a week    Frequency of Social Gatherings with Friends and Family: More than three times a week    Attends Religious Services: 1 to 4 times per year    Active Member of [employer] or Organizations: No    Attends Banker Meetings: Never    Marital Status: Married  Catering Manager Violence: Not At Risk (10/04/2023)   Received from [facility]   Humiliation, Afraid, Rape, and Kick questionnaire    Fear of Current or Ex-Partner: No    Emotionally Abused: No    Physically Abused: No    Sexually Abused: No    Family History  Problem Relation Age of Onset   Heart disease Father    Prostate cancer Father    Heart disease Paternal Grandmother    Heart attack Maternal Grandfather 27   Kidney cancer Neg Hx    Bladder Cancer Neg Hx    Other Neg Hx        pituitary abnormality   Allergies  Allergen Reactions   Lisinopril Rash   Varenicline  Rash   I? Current Facility-Administered Medications  Medication Dose Route Frequency Provider Last Rate Last Admin   acetaminophen  (TYLENOL ) tablet 650 mg  650 mg Oral Q6H PRN Duncan, Hazel V, MD       Or   acetaminophen  (TYLENOL ) suppository 650 mg  650 mg Rectal Q6H PRN Duncan, Hazel V, MD       albuterol  (PROVENTIL ) (2.5 MG/3ML) 0.083% nebulizer solution 2.5 mg   2.5 mg Nebulization Q2H PRN Duncan, Hazel V, MD       atorvastatin  (LIPITOR) tablet 10 mg  10 mg Oral Daily Kandis Devaughn Sayres, MD   10 mg at 11/09/23 1033   azithromycin  (ZITHROMAX ) 500 mg in sodium chloride  0.9 % 250 mL IVPB  500 mg Intravenous Q24H Duncan, Hazel V, MD   Stopped at 11/09/23 0205   ceFEPIme  (MAXIPIME ) 2 g in sodium chloride  0.9 % 100 mL IVPB  2 g Intravenous Q8H Duncan, Hazel V, MD   Stopped at 11/09/23 0555   [START ON 11/10/2023] clofazimine  50 mg capsule (for compassionate use)  100 mg Oral Q breakfast Wouk, Devaughn Sayres, MD       enoxaparin  (LOVENOX ) injection 40 mg  40 mg Subcutaneous Q24H Duncan, Hazel V, MD   40 mg at 11/09/23 9083   guaiFENesin  (MUCINEX ) 12 hr tablet 600 mg  600 mg Oral BID Duncan, Hazel V, MD   600 mg at 11/09/23 9083   HYDROcodone -acetaminophen  (NORCO/VICODIN) 5-325 MG per tablet 1-2 tablet  1-2 tablet Oral Q4H PRN Cleatus Delayne GAILS, MD       ipratropium-albuterol  (DUONEB) 0.5-2.5 (3) MG/3ML nebulizer solution 3 mL  3 mL Nebulization Q6H Cleatus Delayne V, MD   3 mL at 11/09/23 0805   [START ON 11/10/2023] levothyroxine  (SYNTHROID ) tablet 175 mcg  175 mcg Oral Q0600 Wouk, Devaughn Sayres, MD       magnesium  oxide (MAG-OX) tablet 600 mg  600 mg Oral BID Duncan, Hazel V, MD   600 mg at 11/09/23 9085   melatonin tablet 2.5 mg  2.5 mg Oral QHS PRN Duncan, Hazel V, MD       methylPREDNISolone  sodium succinate (SOLU-MEDROL ) 40 mg/mL injection 40 mg  40 mg Intravenous Q12H Kandis Devaughn Sayres, MD   40 mg at 11/09/23 1029   metoprolol  succinate (TOPROL -XL) 24 hr tablet 25  mg  25 mg Oral PRN Wouk, Devaughn Sayres, MD       nicotine  (NICODERM CQ  - dosed in mg/24 hours) patch 21 mg  21 mg Transdermal Daily Duncan, Hazel V, MD   21 mg at 11/09/23 0913   ondansetron  (ZOFRAN ) tablet 4 mg  4 mg Oral Q6H PRN Duncan, Hazel V, MD       Or   ondansetron  (ZOFRAN ) injection 4 mg  4 mg Intravenous Q6H PRN Cleatus Delayne GAILS, MD       pantoprazole  (PROTONIX ) EC tablet 40 mg  40 mg Oral Daily  Cleatus Delayne V, MD   40 mg at 11/09/23 0915   vancomycin  (VANCOREADY) IVPB 1250 mg/250 mL  1,250 mg Intravenous Q12H Dail Rankin RAMAN, RPH 166.7 mL/hr at 11/09/23 0912 1,250 mg at 11/09/23 9087   Current Outpatient Medications  Medication Sig Dispense Refill   acetaminophen  (TYLENOL ) 500 MG tablet Take 500 mg by mouth daily as needed.     albuterol  (PROVENTIL ) (2.5 MG/3ML) 0.083% nebulizer solution TAKE 3 MLS (2.5 MG TOTAL) BY NEBULIZATION 4 (FOUR) TIMES DAILY AS NEEDED FOR WHEEZING OR SHORTNESS OF BREATH. 75 mL 11   albuterol  (VENTOLIN  HFA) 108 (90 Base) MCG/ACT inhaler INHALE 2 PUFFS BY MOUTH EVERY 4 HOURS AS NEEDED FOR WHEEZE OR FOR SHORTNESS OF BREATH 6.7 each 2   amikacin  (AMIKIN ) 1 GM/4ML SOLN injection Inject into the muscle daily.     atorvastatin  (LIPITOR) 10 MG tablet Take 10 mg by mouth daily.     azithromycin  (ZITHROMAX ) 500 MG tablet TAKE 1 TABLET (500 MG TOTAL) BY MOUTH DAILY. 30 tablet 6   BREZTRI  AEROSPHERE 160-9-4.8 MCG/ACT AERO INHALE 2 PUFFS INTO THE LUNGS IN THE MORNING AND AT BEDTIME. 10.7 each 11   clofazimine  50 mg CAPS capsule (for compassionate use) Take 50 mg by mouth daily with breakfast.     clotrimazole  (MYCELEX ) 10 MG troche Take 10 mg by mouth 3 (three) times daily.     diazepam  (VALIUM ) 5 MG tablet Take 5 mg by mouth at bedtime as needed.     ethambutol  (MYAMBUTOL ) 400 MG tablet Take 3 tablets (1,200 mg total) by mouth daily. 90 tablet 6   ferrous sulfate  325 (65 FE) MG tablet Take 325 mg by mouth at bedtime.     folic acid  (FOLVITE ) 1 MG tablet Take 1 mg by mouth daily.     hydrocortisone  (CORTEF ) 10 MG tablet Take two tablets in the morning. Take one tablet in the afternoon (between 2-4pm).     levothyroxine  (SYNTHROID ) 175 MCG tablet Take 175 mcg by mouth daily before breakfast.     lidocaine -prilocaine  (EMLA ) cream APPLY 1 APPLICATION TOPICALLY AS NEEDED. 30 g 1   magnesium  oxide (MAG-OX) 400 MG tablet Take 800 mg by mouth 2 (two) times daily.     melatonin 3  MG TABS tablet Take 3 mg by mouth at bedtime as needed.     metoprolol  succinate (TOPROL -XL) 25 MG 24 hr tablet Take 12.5-25 mg by mouth daily as needed (Acute High Blood Pressure).     nicotine  (NICODERM CQ  - DOSED IN MG/24 HOURS) 21 mg/24hr patch Place 21 mg onto the skin daily.     nitroGLYCERIN  (NITROSTAT ) 0.4 MG SL tablet Place under the tongue.     pantoprazole  (PROTONIX ) 40 MG tablet Take 1 tablet (40 mg total) by mouth 2 (two) times daily before meals     predniSONE  (DELTASONE ) 20 MG tablet Take by mouth.  Take 2 tablets (40 mg  total) by mouth daily for 3 days, THEN 1.5 tablets (30 mg total) daily for 3 days, THEN 1 tablet (20 mg total) daily for 3 days, THEN 0.5 tablets (10 mg total) daily for 3 days.    Instructions: Take 2 tablets (40 mg total) by mouth daily for 3 days, THEN 1.5 tablets (30 mg total) daily for 3 days, THEN 1 tablet (20 mg total) daily for 3 days, THEN 0.5 tablets (10 mg total) daily for 3 days.     sodium chloride  1 g tablet Take 1 tablet (1 g total) by mouth 3 (three) times daily with meals. (Patient taking differently: Take 5 g by mouth 3 (three) times daily with meals.) 90 tablet 1   tadalafil  (CIALIS ) 5 MG tablet Take 1 tablet (5 mg total) by mouth daily as needed for erectile dysfunction. 90 tablet 3   dronabinol  (MARINOL ) 5 MG capsule Take 1 capsule (5 mg total) by mouth 2 (two) times daily before lunch and supper. (Patient not taking: Reported on 11/09/2023) 60 capsule 0   losartan (COZAAR) 25 MG tablet Take 25 mg by mouth daily. (Patient not taking: Reported on 11/09/2023)     OXYGEN Inhale 2 L into the lungs at bedtime.     rifampin  (RIFADIN ) 300 MG capsule Take 2 capsules (600 mg total) by mouth daily. (Patient not taking: Reported on 11/08/2023) 60 capsule 6   Facility-Administered Medications Ordered in Other Encounters  Medication Dose Route Frequency Provider Last Rate Last Admin   heparin  lock flush 100 unit/mL  500 Units Intravenous Once [name], Corday J, MD          Abtx:  Anti-infectives (From admission, onward)    Start     Dose/Rate Route Frequency Ordered Stop   11/10/23 0800  clofazimine  50 mg capsule (for compassionate use)        100 mg Oral Daily with breakfast 11/09/23 0904     11/09/23 1000  ethambutol  (MYAMBUTOL ) tablet 1,200 mg  Status:  Discontinued        1,200 mg Oral Daily 11/09/23 0904 11/09/23 0914   11/09/23 0900  vancomycin  (VANCOREADY) IVPB 1250 mg/250 mL        1,250 mg 166.7 mL/hr over 90 Minutes Intravenous Every 12 hours 11/08/23 2351     11/09/23 0300  ceFEPIme  (MAXIPIME ) 2 g in sodium chloride  0.9 % 100 mL IVPB        2 g 200 mL/hr over 30 Minutes Intravenous Every 8 hours 11/08/23 2330 11/13/23 2159   11/08/23 2345  azithromycin  (ZITHROMAX ) 500 mg in sodium chloride  0.9 % 250 mL IVPB        500 mg 250 mL/hr over 60 Minutes Intravenous Every 24 hours 11/08/23 2330 11/13/23 2329   11/08/23 1900  ceFEPIme  (MAXIPIME ) 2 g in sodium chloride  0.9 % 100 mL IVPB        2 g 200 mL/hr over 30 Minutes Intravenous  Once 11/08/23 1834 11/08/23 2036   11/08/23 1900  vancomycin  (VANCOREADY) IVPB 1750 mg/350 mL        1,750 mg 175 mL/hr over 120 Minutes Intravenous  Once 11/08/23 1834 11/08/23 2325       REVIEW OF SYSTEMS:  Const: negative fever, negative chills, sweating , poor appetite weight loss Eyes: negative diplopia or visual changes, negative eye pain ENT: negative coryza, negative sore throat Resp: as above Cards: negative for chest pain, palpitations, lower extremity edema GU: negative for frequency, dysuria and hematuria GI: some nausea, occasional diarrhea Negative  for abdominal pain,, bleeding, constipation Skin: negative for rash and pruritus Heme: negative for easy bruising and gum/nose bleeding MS:  weakness Neurolo:negative for headaches, dizziness, vertigo, memory problems  Psych: negative for feelings of anxiety, depression  Endocrine: hypothyroidism Allergy/Immunology- as above Objective:   VITALS:  BP 138/88   Pulse 94   Temp 98.3 F (36.8 C) (Oral)   Resp (!) 22   Ht 6' (1.829 m)   Wt 77.1 kg   SpO2 97%   BMI 23.06 kg/m  LDA Port  PHYSICAL EXAM:  General: Alert, cooperative, some resp distress, appears stated age.  Head: Normocephalic, without obvious abnormality, atraumatic. Eyes: Conjunctivae clear, anicteric sclerae. Pupils are equal ENT Nares normal. No drainage or sinus tenderness. Lips, mucosa, and tongue normal. No Thrush Neck: Supple, symmetrical, no adenopathy, thyroid : non tender no carotid bruit and no JVD. Back: No CVA tenderness. Lungs: b/l rhonchi Heart: tachycardia Abdomen: Soft, non-tender,not distended. Bowel sounds normal. No masses Extremities: atraumatic, no cyanosis. No edema. No clubbing Skin: pinkish red color of the skin Lymph: Cervical, supraclavicular normal. Neurologic: Grossly non-focal Pertinent Labs Lab Results CBC    Component Value Date/Time   WBC 7.5 11/09/2023 0555   RBC 4.27 11/09/2023 0555   HGB 13.3 11/09/2023 0555   HGB 12.5 (L) 03/22/2023 1328   HGB 13.4 12/14/2018 1040   HCT 38.9 (L) 11/09/2023 0555   HCT 37.1 (L) 12/14/2018 1040   PLT 204 11/09/2023 0555   PLT 178 03/22/2023 1328   PLT 228 07/08/2017 0807   MCV 91.1 11/09/2023 0555   MCV 93 07/08/2017 0807   MCV 97 06/03/2014 1236   MCH 31.1 11/09/2023 0555   MCHC 34.2 11/09/2023 0555   RDW 11.9 11/09/2023 0555   RDW 13.4 07/08/2017 0807   RDW 17.0 (H) 06/03/2014 1236   LYMPHSABS 0.3 (L) 11/08/2023 1710   LYMPHSABS 1.8 07/08/2017 0807   LYMPHSABS 2.2 06/03/2014 1236   MONOABS 0.3 11/08/2023 1710   MONOABS 0.8 06/03/2014 1236   EOSABS 0.0 11/08/2023 1710   EOSABS 0.1 07/08/2017 0807   EOSABS 0.2 06/03/2014 1236   BASOSABS 0.0 11/08/2023 1710   BASOSABS 0.0 07/08/2017 0807   BASOSABS 0.2 (H) 06/03/2014 1236       Latest Ref Rng & Units 11/09/2023    5:55 AM 11/08/2023    5:10 PM 09/27/2023   11:30 AM  CMP  Glucose 70 - 99 mg/dL 887  896  896    BUN 8 - 23 mg/dL 11  12  6    Creatinine 0.61 - 1.24 mg/dL 9.33  9.23  9.35   Sodium 135 - 145 mmol/L 135  131  133   Potassium 3.5 - 5.1 mmol/L 4.2  3.9  4.1   Chloride 98 - 111 mmol/L 96  91  96   CO2 22 - 32 mmol/L 29  32  26   Calcium  8.9 - 10.3 mg/dL 8.7  8.6  8.7   Total Protein 6.5 - 8.1 g/dL  6.3  6.7   Total Bilirubin 0.0 - 1.2 mg/dL  0.6  0.5   Alkaline Phos 38 - 126 U/L  77  84   AST 15 - 41 U/L  19  18   ALT 0 - 44 U/L  17  15       Microbiology: Recent Results (from the past 240 hours)  Blood culture (routine x 2)     Status: None (Preliminary result)   Collection Time: 11/08/23  5:10 PM  Specimen: BLOOD  Result Value Ref Range Status   Specimen Description BLOOD BLOOD LEFT HAND  Final   Special Requests   Final    BOTTLES DRAWN AEROBIC AND ANAEROBIC Blood Culture adequate volume   Culture   Final    NO GROWTH < 24 HOURS Performed at [facility], [address], [place], [place] [address]    Report Status PENDING  Incomplete  Blood culture (routine x 2)     Status: None (Preliminary result)   Collection Time: 11/08/23  5:15 PM   Specimen: BLOOD  Result Value Ref Range Status   Specimen Description BLOOD BLOOD RIGHT ARM  Final   Special Requests   Final    BOTTLES DRAWN AEROBIC AND ANAEROBIC Blood Culture results may not be optimal due to an inadequate volume of blood received in culture bottles   Culture   Final    NO GROWTH < 12 HOURS Performed at [facility], [address] [place], [place] [address]    Report Status PENDING  Incomplete    IMAGING RESULTS:  I have personally reviewed the films ?cavitary rt upper lobe  Impression/Recommendation ?COPD exacerbation-r/o superadded bacterial infeciton Will check beta D glucan Sputum culture sent on nebulizer, IV solumedrol On IV vanco and cefepime  MRSA nares neg DC vancomycin   Mycobacterium avium intracellulare infection of the lung Cavitary lesion Will restart amikacin  and  ethambutol  and continue azithromycin  and clofazamine  Stage IIIa carcinoma of the lung s/p radiation, chemo and immune therapy- none now'   Adrenal insufficiency - on Hydrocortisone   Hypothyroidism on synthroid   SIADH  Anemia  Current smoker  Pt is frustrated with not improving in the past 1-1/2 years inspite of multiple antibiotics for MAC Palliative on board  ? ?Discussed the management with the patient in detail  _

## 2023-11-09 NOTE — ED Notes (Signed)
 Pt admitted to [facility]

## 2023-11-09 NOTE — ED Notes (Signed)
 Pt updated on plan of care, daughter at bedside. Pt informed that hospitalist has not messaged back about the cough suppressant and updated on next plan of care.

## 2023-11-09 NOTE — Progress Notes (Addendum)
 Pharmacy Antibiotic Note  Paul Carpenter is a 68 y.o. male admitted on 11/08/2023 with pneumonia. History relevant for NSCLC, known cavitary right upper lobe process and resistant MAC infection on several antibiotics. They presented with progressive SOB the last 2 - 3 days with a productive cough productive of green sputum and occasionally hemoptysis. Pharmacy has been consulted for Vancomycin  dosing.  Today, 11/09/2023 WBC 7.5 Scr 0.66 (baseline)  Procal 0.35 Lactic acid 2.1 1/7 Imaging Stable cavitary lesion seen in right lung apex concerning for malignancy; negative for pulmonary embolus   Plan: Day 2 of antibiotics Keeping the 1250 mg IV Q12H. Goal AUC 400-550. Expected AUC: 520 Expected Css min: 14 SCr used: 0.8  Weight used: TBW, Vd used: 0.72 (BMI 23) Patient also on Zithromax  500 mg Q24H Patient also on Cefepime  2g Q8H Continue to monitor renal function and follow culture results Will order MRSA nares to facilitate de-escalation of vancomycin  if appropriate.   TBW 77.1 kg < IBW 77.6 kg  Pharmacy will continue to follow and will adjust abx dosing whenever warranted.  Temp (24hrs), Avg:98 F (36.7 C), Min:97.5 F (36.4 C), Max:98.3 F (36.8 C)   Recent Labs  Lab 11/08/23 1710 11/08/23 1711 11/08/23 2141 11/09/23 0555  WBC 9.7  --   --  7.5  CREATININE 0.76  --   --  0.66  LATICACIDVEN  --  1.3 2.1*  --     Estimated Creatinine Clearance: 99.1 mL/min (by C-G formula based on SCr of 0.66 mg/dL).    Allergies  Allergen Reactions   Lisinopril Rash   Varenicline  Rash    Antimicrobials this admission: 1/07 Cefepime  >> x 5 days 1/07 Azithromycin  >> x 5 days 1/07 Vancomycin  >>   Microbiology results: 1/07 BCx: Pending 1/07 Sputum: Needs to be collected Legionella: in process 1/8 Strep Pneumo urinary: negative 1/8 MRSA nares: ordered   Thank you for allowing pharmacy to be a part of this patient's care.  Alfonso MARLA Buys, PharmD Pharmacy Resident   11/09/2023 8:03 AM

## 2023-11-09 NOTE — ED Notes (Signed)
 Pt repositioned in bed and assisted to sit up for dinner, pt urinal emptied out. Pt requesting a normal diet, hospitalist messaged see orders.

## 2023-11-09 NOTE — Progress Notes (Signed)
 PROGRESS NOTE    Paul Carpenter  FMW:[id number] DOB: 1956-10-02 DOA: 11/08/2023 PCP: Sadie Manna, MD  Outpatient Specialists: pulm, ID, cardiology    Brief Narrative:   From admission h and p  Paul Carpenter is a 68 y.o. 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 Tamea for IV antibiotics and steroids after failing outpatient management of her COPD exacerbation.  Patient has had progressive shortness of breath for the last 4 to 5 weeks worse over the last 2 to 3 days associated with cough productive of green sputum and occasional hemoptysis.  Has been on prednisone  and amikacin  without much improvement.  Uses O2 at 2 L with O2 sats at home have been 82 to 85% on home flow rate.  He denies chest pain, fever or chills.   Assessment & Plan:   Principal Problem:   COPD with acute exacerbation (HCC) Active Problems:   Non-small cell lung cancer, right (HCC)   SIADH (syndrome of inappropriate ADH production) (HCC)   Obstructive sleep apnea   Benign essential hypertension   Hypothyroidism, acquired   Tobacco use disorder   Carotid artery disease (HCC)   Frequent PVCs   Hyponatremia   Pulmonary cavitary lesion   Chronic respiratory failure with hypoxia (HCC)   Chronic adrenal insufficiency (HCC)   * COPD with acute exacerbation (HCC) Resistant MAC with possible superimposed infection Chronic respiratory failure with hypoxia Patient failing amikacin  and prednisone  outpatient CT chest without infiltrate Pulm advises pseudomonal coverage, will continue vanc and cefepime , check mrsa swab No PE on CT - Administer breathing treatment--> DuoNebs every 6 with albuterol  as needed Continue steroids ID consult   Non-small cell lung cancer, right (HCC) Cavitary mass/stable pulmonary  nodules No acute issues suspected Follow-up with oncology   SIADH (syndrome of inappropriate ADH production) (HCC) Hyponatremia Stable  Chronic adrenal insufficiency Home steroids on hold while getting high-dose steroids here   Benign essential hypertension Continue losartan and metoprolol    Hypothyroidism, acquired Continue levothyroxine    Frequent PVCs Continue metoprolol    Tobacco use disorder Nicotine  patch   DVT prophylaxis: lovenox  Code Status: full Family Communication: son at bedside  Level of care: Progressive Status is: Inpatient Remains inpatient appropriate because: severity of illness    Consultants:  ID, pulm  Procedures: none  Antimicrobials:  Vanc/cefepime /azith    Subjective: Ongoing cough and sob  Objective: Vitals:   11/09/23 0700 11/09/23 0715 11/09/23 0800 11/09/23 0830  BP: (!) 147/93  (!) 151/93 (!) 149/78  Pulse: 74 67 82 75  Resp: 19 (!) 22 (!) 21 19  Temp:      TempSrc:      SpO2: 93% 94% 98% 93%  Weight:      Height:        Intake/Output Summary (Last 24 hours) at 11/09/2023 0938 Last data filed at 11/09/2023 9386 Gross per 24 hour  Intake 1000 ml  Output 1550 ml  Net -550 ml   Filed Weights   11/08/23 1654  Weight: 77.1 kg    Examination:  General exam: Appears calm and comfortable, chronically ill appearing Respiratory system: exp wheeze, rales throughout Cardiovascular system: S1 & S2 heard, RRR.   Gastrointestinal system: Abdomen is nondistended,   Central nervous system: Alert and oriented. No focal neurological deficits. Extremities: Symmetric 5 x 5  power. Skin: No rashes, lesions or ulcers Psychiatry: Judgement and insight appear normal. Mood & affect appropriate.     Data Reviewed: I have personally reviewed following labs and imaging studies  CBC: Recent Labs  Lab 11/08/23 1710 11/09/23 0555  WBC 9.7 7.5  NEUTROABS 9.2*  --   HGB 14.3 13.3  HCT 41.3 38.9*  MCV 92.0 91.1  PLT 213 204    Basic Metabolic Panel: Recent Labs  Lab 11/08/23 1710 11/09/23 0555  NA 131* 135  K 3.9 4.2  CL 91* 96*  CO2 32 29  GLUCOSE 103* 112*  BUN 12 11  CREATININE 0.76 0.66  CALCIUM  8.6* 8.7*   GFR: Estimated Creatinine Clearance: 99.1 mL/min (by C-G formula based on SCr of 0.66 mg/dL). Liver Function Tests: Recent Labs  Lab 11/08/23 1710  AST 19  ALT 17  ALKPHOS 77  BILITOT 0.6  PROT 6.3*  ALBUMIN 3.8   No results for input(s): LIPASE, AMYLASE in the last 168 hours. No results for input(s): AMMONIA in the last 168 hours. Coagulation Profile: No results for input(s): INR, PROTIME in the last 168 hours. Cardiac Enzymes: No results for input(s): CKTOTAL, CKMB, CKMBINDEX, TROPONINI in the last 168 hours. BNP (last 3 results) No results for input(s): PROBNP in the last 8760 hours. HbA1C: No results for input(s): HGBA1C in the last 72 hours. CBG: No results for input(s): GLUCAP in the last 168 hours. Lipid Profile: No results for input(s): CHOL, HDL, LDLCALC, TRIG, CHOLHDL, LDLDIRECT in the last 72 hours. Thyroid  Function Tests: No results for input(s): TSH, T4TOTAL, FREET4, T3FREE, THYROIDAB in the last 72 hours. Anemia Panel: No results for input(s): VITAMINB12, FOLATE, FERRITIN, TIBC, IRON , RETICCTPCT in the last 72 hours. Urine analysis:    Component Value Date/Time   COLORURINE YELLOW (A) 02/09/2023 2128   APPEARANCEUR CLEAR (A) 02/09/2023 2128   APPEARANCEUR Clear 08/15/2020 0951   LABSPEC 1.013 02/09/2023 2128   LABSPEC 1.004 01/26/2012 0944   PHURINE 6.0 02/09/2023 2128   GLUCOSEU NEGATIVE 02/09/2023 2128   GLUCOSEU Negative 01/26/2012 0944   HGBUR NEGATIVE 02/09/2023 2128   BILIRUBINUR NEGATIVE 02/09/2023 2128   BILIRUBINUR Negative 08/15/2020 0951   BILIRUBINUR Negative 01/26/2012 0944   KETONESUR NEGATIVE 02/09/2023 2128   PROTEINUR NEGATIVE 02/09/2023 2128   NITRITE NEGATIVE 02/09/2023 2128    LEUKOCYTESUR NEGATIVE 02/09/2023 2128   LEUKOCYTESUR Negative 01/26/2012 0944   Sepsis Labs: @LABRCNTIP (procalcitonin:4,lacticidven:4)  ) Recent Results (from the past 240 hours)  Blood culture (routine x 2)     Status: None (Preliminary result)   Collection Time: 11/08/23  5:10 PM   Specimen: BLOOD  Result Value Ref Range Status   Specimen Description BLOOD BLOOD LEFT HAND  Final   Special Requests   Final    BOTTLES DRAWN AEROBIC AND ANAEROBIC Blood Culture adequate volume   Culture   Final    NO GROWTH < 24 HOURS Performed at [facility], [address], [place], [place] [address]    Report Status PENDING  Incomplete  Blood culture (routine x 2)     Status: None (Preliminary result)   Collection Time: 11/08/23  5:15 PM   Specimen: BLOOD  Result Value Ref Range Status   Specimen Description BLOOD BLOOD RIGHT ARM  Final   Special Requests   Final    BOTTLES DRAWN AEROBIC AND ANAEROBIC Blood Culture results may not be optimal due to an inadequate volume of blood received in culture bottles   Culture   Final  NO GROWTH < 12 HOURS Performed at [facility], [address] [place], [place] [address]    Report Status PENDING  Incomplete         Radiology Studies: CT Angio Chest PE W and/or Wo Contrast Result Date: 11/08/2023 CLINICAL DATA:  Shortness of breath EXAM: CT ANGIOGRAPHY CHEST WITH CONTRAST TECHNIQUE: Multidetector CT imaging of the chest was performed using the standard protocol during bolus administration of intravenous contrast. Multiplanar CT image reconstructions and MIPs were obtained to evaluate the vascular anatomy. RADIATION DOSE REDUCTION: This exam was performed according to the departmental dose-optimization program which includes automated exposure control, adjustment of the mA and/or kV according to patient size and/or use of iterative reconstruction technique. CONTRAST:  80mL OMNIPAQUE  IOHEXOL  350 MG/ML SOLN COMPARISON:  Chest x-ray  11/08/2023, CT chest 08/10/2023, 07/08/2023, 05/13/2023 and multiple prior exams FINDINGS: Cardiovascular: Satisfactory opacification of the pulmonary arteries to the segmental level. No evidence of pulmonary embolism. Nonaneurysmal aorta. Moderate atherosclerosis. Coronary vascular calcification. Normal cardiac size. Small pericardial effusion Mediastinum/Nodes: Patent trachea. No suspicious thyroid  mass. Calcified mediastinal lymph nodes consistent with prior granulomatous disease. Similar mild mediastinal lymph nodes. Esophagus within normal limits. Lungs/Pleura: Large thick-walled cavitary mass at the right apex with internal debris, measures about 8.6 x 6.9 by 9.4 cm on series 5, image 38 and coronal series 7, image 104. Cavitary mass directly communicates with right upper lobe airway, series 5 image 52 through 56. Advanced emphysema. Multiple solid bilateral pulmonary nodules are again noted. Grossly stable distribution when compared with prior exam. Index irregular right lung base pulmonary nodule on series 5, image 124 measures 12 x 10 mm previously 12 x 8 mm. Index left lower lobe pulmonary nodule on series 5, image 82 measures 10 x 10 mm previously 11 x 10 mm. Upper Abdomen: No acute finding Musculoskeletal: Similar chronic irregularity of right first and second ribs. No acute osseous abnormality. Review of the MIP images confirms the above findings. IMPRESSION: 1. Negative for acute pulmonary embolus. 2. Large thick-walled cavitary mass at the right apex with internal debris, grossly stable. Cavitary mass directly communicates with right upper lobe airway as seen on prior exam. 3. Multiple solid bilateral pulmonary nodules, grossly stable distribution when compared with prior exam. Measured index nodules are grossly stable. 4. Advanced emphysema. 5. Aortic atherosclerosis. Aortic Atherosclerosis (ICD10-I70.0) and Emphysema (ICD10-J43.9). Electronically Signed   By: Luke Bun M.D.   On: 11/08/2023  20:17   DG Chest Portable 1 View Result Date: 11/08/2023 CLINICAL DATA:  Shortness of breath. EXAM: PORTABLE CHEST 1 VIEW COMPARISON:  February 09, 2023. FINDINGS: The heart size and mediastinal contours are within normal limits. Right internal jugular Port-A-Cath is unchanged. Stable cavitary lesion is noted in right lung apex concerning for malignancy. Small pulmonary nodules may be present in both lungs as noted on prior exams. The visualized skeletal structures are unremarkable. IMPRESSION: Stable cavitary lesion seen in right lung apex concerning for malignancy. Small pulmonary nodules may be present bilaterally as described on prior studies. Electronically Signed   By: Lynwood Landy Raddle M.D.   On: 11/08/2023 17:36        Scheduled Meds:  atorvastatin   10 mg Oral Daily   [START ON 11/10/2023] clofazimine   100 mg Oral Q breakfast   enoxaparin  (LOVENOX ) injection  40 mg Subcutaneous Q24H   guaiFENesin   600 mg Oral BID   ipratropium-albuterol   3 mL Nebulization Q6H   [START ON 11/10/2023] levothyroxine   175 mcg Oral Q0600  magnesium  oxide  600 mg Oral BID   methylPREDNISolone  (SOLU-MEDROL ) injection  40 mg Intravenous Q12H   Followed by   NOREEN ON 11/10/2023] predniSONE   40 mg Oral Q breakfast   nicotine   21 mg Transdermal Daily   pantoprazole   40 mg Oral Daily   Continuous Infusions:  azithromycin  Stopped (11/09/23 0205)   ceFEPime  (MAXIPIME ) IV Stopped (11/09/23 0555)   vancomycin  1,250 mg (11/09/23 0912)     LOS: 1 day     Devaughn KATHEE Ban, MD [facility]   If 7PM-7AM, please contact night-coverage [email or web address] Password [facility] 11/09/2023, 9:38 AM

## 2023-11-09 NOTE — ED Notes (Addendum)
 Hospitalist messaged due to pt having questions about his home medications he takes (an antibiotic Amikacin) see orders

## 2023-11-09 NOTE — ED Notes (Signed)
 Inpatient bed requested for patient.  No inpatient beds available at this time. Patient informed and understanding acknowledged.  Patient remains sitting up in recliner chair, eating lunch.  States he is more comfortable in chair at this time.  Continue to monitor.

## 2023-11-09 NOTE — Progress Notes (Signed)
 Pt arrived to unit via WC with O2. Alert and oriented x4.

## 2023-11-09 NOTE — ED Notes (Signed)
 Family into hallway stating pt cannot breath and they would like a cough suppressant for the patient, this RN will get pt a breathing treatment and message hospitalist for further information on a cough suppressant order.

## 2023-11-10 ENCOUNTER — Inpatient Hospital Stay
Admit: 2023-11-10 | Discharge: 2023-11-10 | Disposition: A | Discharge status: Home / Self Care | Payer: Medicare Other | Attending: Pulmonary Disease | Admitting: Pulmonary Disease

## 2023-11-10 ENCOUNTER — Ambulatory Visit: Payer: Medicare Other | Admitting: Pulmonary Disease

## 2023-11-10 DIAGNOSIS — C3491 Malignant neoplasm of unspecified part of right bronchus or lung: Secondary | ICD-10-CM | POA: Diagnosis not present

## 2023-11-10 DIAGNOSIS — J9621 Acute and chronic respiratory failure with hypoxia: Secondary | ICD-10-CM | POA: Diagnosis not present

## 2023-11-10 DIAGNOSIS — E2740 Unspecified adrenocortical insufficiency: Secondary | ICD-10-CM

## 2023-11-10 DIAGNOSIS — I10 Essential (primary) hypertension: Secondary | ICD-10-CM | POA: Diagnosis not present

## 2023-11-10 DIAGNOSIS — J441 Chronic obstructive pulmonary disease with (acute) exacerbation: Secondary | ICD-10-CM | POA: Diagnosis not present

## 2023-11-10 DIAGNOSIS — A310 Pulmonary mycobacterial infection: Secondary | ICD-10-CM | POA: Diagnosis not present

## 2023-11-10 DIAGNOSIS — F17200 Nicotine dependence, unspecified, uncomplicated: Secondary | ICD-10-CM

## 2023-11-10 DIAGNOSIS — F17210 Nicotine dependence, cigarettes, uncomplicated: Secondary | ICD-10-CM | POA: Diagnosis not present

## 2023-11-10 LAB — BASIC METABOLIC PANEL
Anion gap: 11 (ref 5–15)
BUN: 12 mg/dL (ref 8–23)
CO2: 29 mmol/L (ref 22–32)
Calcium: 8.5 mg/dL — ABNORMAL LOW (ref 8.9–10.3)
Chloride: 95 mmol/L — ABNORMAL LOW (ref 98–111)
Creatinine, Ser: 0.7 mg/dL (ref 0.61–1.24)
GFR, Estimated: >60 mL/min (ref >60)
Glucose, Bld: 128 mg/dL — ABNORMAL HIGH (ref 70–99)
Potassium: 3.9 mmol/L (ref 3.5–5.1)
Sodium: 135 mmol/L (ref 135–145)

## 2023-11-10 MED ORDER — DIAZEPAM 5 MG PO TABS
5.0000 mg | ORAL_TABLET | Freq: Once | ORAL | Status: AC
  Administered 2023-11-10: 5 mg via ORAL
  Filled 2023-11-10: qty 1

## 2023-11-10 MED ORDER — AZITHROMYCIN 500 MG PO TABS
500.0000 mg | ORAL_TABLET | Freq: Every day | ORAL | Status: DC
  Administered 2023-11-10 – 2023-11-14 (×5): 500 mg via ORAL
  Filled 2023-11-10 (×5): qty 1

## 2023-11-10 MED ORDER — DIAZEPAM 5 MG PO TABS
5.0000 mg | ORAL_TABLET | Freq: Every evening | ORAL | Status: DC | PRN
  Administered 2023-11-10 – 2023-11-13 (×3): 5 mg via ORAL
  Filled 2023-11-10 (×4): qty 1

## 2023-11-10 MED ORDER — HEPARIN SOD (PORK) LOCK FLUSH 100 UNIT/ML IV SOLN
500.0000 [IU] | Freq: Once | INTRAVENOUS | Status: AC
  Administered 2023-11-10: 500 [IU] via INTRAVENOUS
  Filled 2023-11-10: qty 5

## 2023-11-10 MED ORDER — FOLIC ACID 1 MG PO TABS
1.0000 mg | ORAL_TABLET | Freq: Every day | ORAL | Status: DC
  Administered 2023-11-10 – 2023-11-14 (×5): 1 mg via ORAL
  Filled 2023-11-10 (×5): qty 1

## 2023-11-10 MED ORDER — PERFLUTREN LIPID MICROSPHERE
1.0000 mL | INTRAVENOUS | Status: AC | PRN
  Administered 2023-11-10: 6 mL via INTRAVENOUS

## 2023-11-10 NOTE — Progress Notes (Signed)
 PROGRESS NOTE    Paul Carpenter   FMW:[id number] DOB: 15-Feb-1956  DOA: 11/08/2023 Date of Service: 11/10/23 which is hospital day 2  PCP: Sadie Manna, MD    Hospital course / significant events:   HPI: [name] is a 68 y.o. 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 Tamea for IV antibiotics and steroids after failing outpatient management of her COPD exacerbation.  Patient has had progressive shortness of breath for the last 4 to 5 weeks worse over the last 2 to 3 days associated with cough productive of green sputum and occasional hemoptysis.  Has been on prednisone  and amikacin  without much improvement.  Uses O2 at 2 L with O2 sats at home have been 82 to 85% on home flow rate.    01/07: admitted to hospitalist service.CTA chest PE protocol negative for PE, showing known cavitary mass, showing stable pulmonary nodules that are related to his MAC which do show waxing and waning pattern of size change but overall the nodules are stable, CT also of course shows advanced emphysema. Got DuoNebs and Solu-Medrol  and started on cefepime  and vancomycin .  01/08: per ID restart amikacin  and ethambutol  and continue azithromycin  and clofazamine. Per Pulm appears to be some improved with regards to work of breathing but continues to have significant cough and dyspnea, ordered Echo, tussionex, albuterol  nebs, revefenacin  nebs, pulmicort  nebs, aformoterol nebs 01/09: continue pulmonary treatments and abx, echo pending.      Consultants:  Pulmonology Infectious Disease  Palliative Care   Procedures/Surgeries: none      ASSESSMENT & PLAN:   COPD with acute exacerbation  Resistant MAC with possible superimposed infection Chronic respiratory failure with hypoxia Patient failing  amikacin  and prednisone  outpatient Per pulmonary, he has been in progressive decline for over a year.  Despite his many comorbidities he continues to smoke 1 to 2 packs of cigarettes per day.  Per ID 01/08 - restart amikacin  and ethambutol , continue azithromycin  and clofazamine  Per Pulm 01/08 - ordered Echo, tussionex, albuterol  nebs, revefenacin  nebs, pulmicort  nebs, aformoterol nebs Continue steroids SoluMedrol 40 mg IV q12h  Guaifenesin  scheduled, tussionex prn    Non-small cell lung cancer, right Cavitary mass/stable pulmonary nodules No acute issues suspected Follow-up with oncology Pt requests port-a-cath de access today    SIADH (syndrome of inappropriate ADH production) (HCC) Hyponatremia Stable Monitor BMP daily Normal sodium, holding home salt tabs for now   Anxiety/insomnia Chronic benzodiazepine use Home valium  ok for prn at bedtime    Chronic adrenal insufficiency Home steroids on hold while getting high-dose steroids here   Benign essential hypertension Continue losartan and metoprolol    Hypothyroidism, acquired Continue levothyroxine    Frequent PVCs Continue metoprolol    Tobacco use disorder Nicotine  patch       DVT prophylaxis: lovenox   IV fluids: no continuous IV fluids  Nutrition: regular diet Central lines / invasive devices: has port in place  Code Status: FULL CODE ACP documentation reviewed:  none on file in [facility]  TOC needs: none at this time Barriers to dispo / significant pending items: await echo, clinical improvement, ID/Pulm clerance prior to dc              Subjective / Brief ROS:  Patient reports cough, concern not getting all his home meds particularly the  valium  in evening, did not sleep much last night. Requesting de-access port-a-cath states he is due for this anyway.  Denies CP/SOB at rest Pain controlled.  Tolerating diet.  Reports no concerns w/ urination/defecation.   Family Communication: family at bedside  non rounds including his wife Robin     Objective Findings:  Vitals:   11/10/23 0421 11/10/23 0723 11/10/23 0738 11/10/23 1204  BP: 138/83 (!) 148/82  135/88  Pulse: 68 80  99  Resp: 18 20  20   Temp: 97.8 F (36.6 C) 97.9 F (36.6 C)  98.6 F (37 C)  TempSrc:  Oral  Oral  SpO2: 98% 92% 98% 98%  Weight:      Height:        Intake/Output Summary (Last 24 hours) at 11/10/2023 1442 Last data filed at 11/10/2023 1206 Gross per 24 hour  Intake 347.52 ml  Output 1250 ml  Net -902.48 ml   Filed Weights   11/08/23 1654  Weight: 77.1 kg    Examination:  Physical Exam Constitutional:      General: He is not in acute distress. Cardiovascular:     Rate and Rhythm: Normal rate and regular rhythm.  Pulmonary:     Effort: Pulmonary effort is normal.     Breath sounds: Wheezing present.  Musculoskeletal:     Right lower leg: No edema.     Left lower leg: No edema.  Neurological:     General: No focal deficit present.     Mental Status: He is alert and oriented to person, place, and time.  Psychiatric:        Mood and Affect: Mood normal.        Behavior: Behavior normal.          Scheduled Medications:   arformoterol   15 mcg Nebulization BID   atorvastatin   10 mg Oral Daily   azithromycin   500 mg Oral q1800   budesonide  (PULMICORT ) nebulizer solution  0.5 mg Nebulization BID   clofazimine   100 mg Oral Q breakfast   enoxaparin  (LOVENOX ) injection  40 mg Subcutaneous Q24H   ethambutol   1,200 mg Oral Daily   folic acid   1 mg Oral Daily   guaiFENesin   600 mg Oral BID   heparin  lock flush  500 Units Intravenous Once   levothyroxine   175 mcg Oral Q0600   magnesium  oxide  600 mg Oral BID   methylPREDNISolone  (SOLU-MEDROL ) injection  40 mg Intravenous Q12H   nicotine   21 mg Transdermal Daily   pantoprazole   40 mg Oral Daily   revefenacin   175 mcg Nebulization Daily    Continuous Infusions:  amikacin  (AMIKIN ) 750 mg in dextrose  5 % 100 mL IVPB 750 mg (11/10/23 0935)    ceFEPime  (MAXIPIME ) IV 2 g (11/10/23 0611)    PRN Medications:  acetaminophen  **OR** acetaminophen , albuterol , chlorpheniramine-HYDROcodone , diazepam , HYDROcodone -acetaminophen , melatonin, metoprolol  succinate, ondansetron  **OR** ondansetron  (ZOFRAN ) IV  Antimicrobials from admission:  Anti-infectives (From admission, onward)    Start     Dose/Rate Route Frequency Ordered Stop   11/10/23 1800  azithromycin  (ZITHROMAX ) tablet 500 mg        500 mg Oral Daily-1800 11/10/23 1435     11/10/23 1000  ethambutol  (MYAMBUTOL ) tablet 1,200 mg        1,200 mg Oral Daily 11/09/23 1900     11/10/23 0800  clofazimine  50 mg capsule (for compassionate use)  Status:  Discontinued        100 mg Oral Daily with breakfast 11/09/23 0904 11/09/23  1237   11/10/23 0800  amikacin  (AMIKIN ) 750 mg in dextrose  5 % 100 mL IVPB        750 mg 103 mL/hr over 60 Minutes Intravenous Every 24 hours 11/09/23 1900     11/09/23 1245  clofazimine  50 mg capsule (for compassionate use)        100 mg Oral Daily with breakfast 11/09/23 1237     11/09/23 1000  ethambutol  (MYAMBUTOL ) tablet 1,200 mg  Status:  Discontinued        1,200 mg Oral Daily 11/09/23 0904 11/09/23 0914   11/09/23 0900  vancomycin  (VANCOREADY) IVPB 1250 mg/250 mL  Status:  Discontinued        1,250 mg 166.7 mL/hr over 90 Minutes Intravenous Every 12 hours 11/08/23 2351 11/09/23 1857   11/09/23 0300  ceFEPIme  (MAXIPIME ) 2 g in sodium chloride  0.9 % 100 mL IVPB        2 g 200 mL/hr over 30 Minutes Intravenous Every 8 hours 11/08/23 2330 11/13/23 2159   11/08/23 2345  azithromycin  (ZITHROMAX ) 500 mg in sodium chloride  0.9 % 250 mL IVPB  Status:  Discontinued        500 mg 250 mL/hr over 60 Minutes Intravenous Every 24 hours 11/08/23 2330 11/10/23 1435   11/08/23 1900  ceFEPIme  (MAXIPIME ) 2 g in sodium chloride  0.9 % 100 mL IVPB        2 g 200 mL/hr over 30 Minutes Intravenous  Once 11/08/23 1834 11/08/23 2036   11/08/23 1900  vancomycin  (VANCOREADY) IVPB  1750 mg/350 mL        1,750 mg 175 mL/hr over 120 Minutes Intravenous  Once 11/08/23 1834 11/08/23 2325           Data Reviewed:  I have personally reviewed the following...  CBC: Recent Labs  Lab 11/08/23 1710 11/09/23 0555  WBC 9.7 7.5  NEUTROABS 9.2*  --   HGB 14.3 13.3  HCT 41.3 38.9*  MCV 92.0 91.1  PLT 213 204   Basic Metabolic Panel: Recent Labs  Lab 11/08/23 1710 11/09/23 0555 11/10/23 0405  NA 131* 135 135  K 3.9 4.2 3.9  CL 91* 96* 95*  CO2 32 29 29  GLUCOSE 103* 112* 128*  BUN 12 11 12   CREATININE 0.76 0.66 0.70  CALCIUM  8.6* 8.7* 8.5*   GFR: Estimated Creatinine Clearance: 99.1 mL/min (by C-G formula based on SCr of 0.7 mg/dL). Liver Function Tests: Recent Labs  Lab 11/08/23 1710  AST 19  ALT 17  ALKPHOS 77  BILITOT 0.6  PROT 6.3*  ALBUMIN 3.8   No results for input(s): LIPASE, AMYLASE in the last 168 hours. No results for input(s): AMMONIA in the last 168 hours. Coagulation Profile: No results for input(s): INR, PROTIME in the last 168 hours. Cardiac Enzymes: No results for input(s): CKTOTAL, CKMB, CKMBINDEX, TROPONINI in the last 168 hours. BNP (last 3 results) No results for input(s): PROBNP in the last 8760 hours. HbA1C: No results for input(s): HGBA1C in the last 72 hours. CBG: No results for input(s): GLUCAP in the last 168 hours. Lipid Profile: No results for input(s): CHOL, HDL, LDLCALC, TRIG, CHOLHDL, LDLDIRECT in the last 72 hours. Thyroid  Function Tests: No results for input(s): TSH, T4TOTAL, FREET4, T3FREE, THYROIDAB in the last 72 hours. Anemia Panel: No results for input(s): VITAMINB12, FOLATE, FERRITIN, TIBC, IRON , RETICCTPCT in the last 72 hours. Most Recent Urinalysis On File:     Component Value Date/Time   COLORURINE YELLOW (A) 02/09/2023 2128  APPEARANCEUR CLEAR (A) 02/09/2023 2128   APPEARANCEUR Clear 08/15/2020 0951   LABSPEC 1.013 02/09/2023 2128    LABSPEC 1.004 01/26/2012 0944   PHURINE 6.0 02/09/2023 2128   GLUCOSEU NEGATIVE 02/09/2023 2128   GLUCOSEU Negative 01/26/2012 0944   HGBUR NEGATIVE 02/09/2023 2128   BILIRUBINUR NEGATIVE 02/09/2023 2128   BILIRUBINUR Negative 08/15/2020 0951   BILIRUBINUR Negative 01/26/2012 0944   KETONESUR NEGATIVE 02/09/2023 2128   PROTEINUR NEGATIVE 02/09/2023 2128   NITRITE NEGATIVE 02/09/2023 2128   LEUKOCYTESUR NEGATIVE 02/09/2023 2128   LEUKOCYTESUR Negative 01/26/2012 0944   Sepsis Labs: @LABRCNTIP (procalcitonin:4,lacticidven:4) Microbiology: Recent Results (from the past 240 hours)  Blood culture (routine x 2)     Status: None (Preliminary result)   Collection Time: 11/08/23  5:10 PM   Specimen: BLOOD  Result Value Ref Range Status   Specimen Description BLOOD BLOOD LEFT HAND  Final   Special Requests   Final    BOTTLES DRAWN AEROBIC AND ANAEROBIC Blood Culture adequate volume   Culture   Final    NO GROWTH 2 DAYS Performed at [facility], [address] [place], [place] [address]    Report Status PENDING  Incomplete  Blood culture (routine x 2)     Status: None (Preliminary result)   Collection Time: 11/08/23  5:15 PM   Specimen: BLOOD  Result Value Ref Range Status   Specimen Description BLOOD BLOOD RIGHT ARM  Final   Special Requests   Final    BOTTLES DRAWN AEROBIC AND ANAEROBIC Blood Culture results may not be optimal due to an inadequate volume of blood received in culture bottles   Culture   Final    NO GROWTH 2 DAYS Performed at [facility], [address], [place], [place] [address]    Report Status PENDING  Incomplete  Respiratory (~20 pathogens) panel by PCR     Status: None   Collection Time: 11/09/23 10:22 AM   Specimen: Expectorated Sputum; Respiratory  Result Value Ref Range Status   Adenovirus NOT DETECTED NOT DETECTED Final   Coronavirus 229E NOT DETECTED NOT DETECTED Final    Comment: (NOTE) The Coronavirus on the Respiratory  Panel, DOES NOT test for the novel  Coronavirus (2019 nCoV)    Coronavirus HKU1 NOT DETECTED NOT DETECTED Final   Coronavirus NL63 NOT DETECTED NOT DETECTED Final   Coronavirus OC43 NOT DETECTED NOT DETECTED Final   Metapneumovirus NOT DETECTED NOT DETECTED Final   Rhinovirus / Enterovirus NOT DETECTED NOT DETECTED Final   Influenza A NOT DETECTED NOT DETECTED Final   Influenza B NOT DETECTED NOT DETECTED Final   Parainfluenza Virus 1 NOT DETECTED NOT DETECTED Final   Parainfluenza Virus 2 NOT DETECTED NOT DETECTED Final   Parainfluenza Virus 3 NOT DETECTED NOT DETECTED Final   Parainfluenza Virus 4 NOT DETECTED NOT DETECTED Final   Respiratory Syncytial Virus NOT DETECTED NOT DETECTED Final   Bordetella pertussis NOT DETECTED NOT DETECTED Final   Bordetella Parapertussis NOT DETECTED NOT DETECTED Final   Chlamydophila pneumoniae NOT DETECTED NOT DETECTED Final   Mycoplasma pneumoniae NOT DETECTED NOT DETECTED Final    Comment: Performed at [facility], [address] [place], [place] [address]  Expectorated Sputum Assessment w Gram Stain, Rflx to Resp Cult     Status: None   Collection Time: 11/09/23 10:38 AM   Specimen: Sputum  Result Value Ref Range Status   Specimen Description SPUTUM  Final   Special Requests Normal  Final   Sputum  evaluation   Final    THIS SPECIMEN IS ACCEPTABLE FOR SPUTUM CULTURE Performed at [facility], [address] [place], [place] [address]    Report Status 11/09/2023 FINAL  Final  Culture, Respiratory w Gram Stain     Status: None (Preliminary result)   Collection Time: 11/09/23 10:38 AM   Specimen: SPU  Result Value Ref Range Status   Specimen Description   Final    SPUTUM Performed at [facility], [address], [place], [place] [address]    Special Requests   Final    Normal Reflexed from [id number] Performed at [facility], [address] [place], [place] [address]    Gram Stain NO WBC SEEN RARE  GRAM POSITIVE COCCI IN CHAINS   Final   Culture   Final    CULTURE REINCUBATED FOR BETTER GROWTH Performed at [facility], [address], [place], [place] [address]    Report Status PENDING  Incomplete  MRSA Next Gen by PCR, Nasal     Status: None   Collection Time: 11/09/23 10:40 AM   Specimen: Expectorated Sputum; Nasal Swab  Result Value Ref Range Status   MRSA by PCR Next Gen NOT DETECTED NOT DETECTED Final    Comment: (NOTE) The GeneXpert MRSA Assay (FDA approved for NASAL specimens only), is one component of a comprehensive MRSA colonization surveillance program. It is not intended to diagnose MRSA infection nor to guide or monitor treatment for MRSA infections. Test performance is not FDA approved in patients less than 23 years old. Performed at [facility], [address] [place], [place] [address]       Radiology Studies last 3 days: CT Angio Chest PE W and/or Wo Contrast Result Date: 11/08/2023 CLINICAL DATA:  Shortness of breath EXAM: CT ANGIOGRAPHY CHEST WITH CONTRAST TECHNIQUE: Multidetector CT imaging of the chest was performed using the standard protocol during bolus administration of intravenous contrast. Multiplanar CT image reconstructions and MIPs were obtained to evaluate the vascular anatomy. RADIATION DOSE REDUCTION: This exam was performed according to the departmental dose-optimization program which includes automated exposure control, adjustment of the mA and/or kV according to patient size and/or use of iterative reconstruction technique. CONTRAST:  80mL OMNIPAQUE  IOHEXOL  350 MG/ML SOLN COMPARISON:  Chest x-ray 11/08/2023, CT chest 08/10/2023, 07/08/2023, 05/13/2023 and multiple prior exams FINDINGS: Cardiovascular: Satisfactory opacification of the pulmonary arteries to the segmental level. No evidence of pulmonary embolism. Nonaneurysmal aorta. Moderate atherosclerosis. Coronary vascular calcification. Normal cardiac size. Small pericardial  effusion Mediastinum/Nodes: Patent trachea. No suspicious thyroid  mass. Calcified mediastinal lymph nodes consistent with prior granulomatous disease. Similar mild mediastinal lymph nodes. Esophagus within normal limits. Lungs/Pleura: Large thick-walled cavitary mass at the right apex with internal debris, measures about 8.6 x 6.9 by 9.4 cm on series 5, image 38 and coronal series 7, image 104. Cavitary mass directly communicates with right upper lobe airway, series 5 image 52 through 56. Advanced emphysema. Multiple solid bilateral pulmonary nodules are again noted. Grossly stable distribution when compared with prior exam. Index irregular right lung base pulmonary nodule on series 5, image 124 measures 12 x 10 mm previously 12 x 8 mm. Index left lower lobe pulmonary nodule on series 5, image 82 measures 10 x 10 mm previously 11 x 10 mm. Upper Abdomen: No acute finding Musculoskeletal: Similar chronic irregularity of right first and second ribs. No acute osseous abnormality. Review of the MIP images confirms the above findings. IMPRESSION: 1. Negative for acute pulmonary  embolus. 2. Large thick-walled cavitary mass at the right apex with internal debris, grossly stable. Cavitary mass directly communicates with right upper lobe airway as seen on prior exam. 3. Multiple solid bilateral pulmonary nodules, grossly stable distribution when compared with prior exam. Measured index nodules are grossly stable. 4. Advanced emphysema. 5. Aortic atherosclerosis. Aortic Atherosclerosis (ICD10-I70.0) and Emphysema (ICD10-J43.9). Electronically Signed   By: Luke Bun M.D.   On: 11/08/2023 20:17   DG Chest Portable 1 View Result Date: 11/08/2023 CLINICAL DATA:  Shortness of breath. EXAM: PORTABLE CHEST 1 VIEW COMPARISON:  February 09, 2023. FINDINGS: The heart size and mediastinal contours are within normal limits. Right internal jugular Port-A-Cath is unchanged. Stable cavitary lesion is noted in right lung apex concerning  for malignancy. Small pulmonary nodules may be present in both lungs as noted on prior exams. The visualized skeletal structures are unremarkable. IMPRESSION: Stable cavitary lesion seen in right lung apex concerning for malignancy. Small pulmonary nodules may be present bilaterally as described on prior studies. Electronically Signed   By: Lynwood Landy Raddle M.D.   On: 11/08/2023 17:36       Time spent: 50 min     Keatin Benham, DO [facility] 11/10/2023, 2:42 PM    Dictation software may have been used to generate the above note. Typos may occur and escape review in typed/dictated notes. Please contact Dr Marsa directly for clarity if needed.  Staff may message me via secure chat in Epic  but this may not receive an immediate response,  please page me for urgent matters!  If 7PM-7AM, please contact night coverage [email or web address]

## 2023-11-10 NOTE — Plan of Care (Signed)

## 2023-11-10 NOTE — Progress Notes (Signed)
 NAME:  Paul Carpenter, MRN:  [id number], DOB:  [birth year], LOS: 2 ADMISSION DATE:  11/08/2023, CONSULTATION DATE: 09 November 2023 REFERRING MD: Devaughn Rozella Ban, MD, CHIEF COMPLAINT: Follow-up requested  History of Present Illness:  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  For the details of that visit please refer to that note.  The patient is also followed at [facility] at the [facility] (Dr. India Man) for management of his resistant MAC.  On evaluation yesterday the patient was noted to be in moderate respiratory distress with significant bronchospasm noted.  He had failed outpatient therapy for COPD exacerbation (prednisone  taper and antibiotics ordered by [facility]).  Recently he was started on clofazimine  on compassionate use protocol for his MAC.  He is also on azithromycin , amikacin  and ethambutol .  The patient was referred for evaluation to ED for subsequent admission.  CT angio chest performed yesterday did not show pulmonary emboli.  The patient does have SEVERE emphysema/COPD noted on the scan.  He has the aforementioned cavitary process of his right upper lobe that is unchanged from prior.  There are lung nodules noted that are related to his MAC these do show waxing and waning pattern of size change.  Overall the nodules are stable.  The patient has been in progressive decline for over a year.  Despite his many comorbidities he continues to smoke 1 to 2 packs of cigarettes per day.  I was asked to follow-up with the patient at the request of Dr. Ban, I have discussed the case thoroughly with him.  On reevaluation today, the  patient appears to be somewhat improved with regards to work of breathing.  He continues to have significant cough and dyspnea.  I have reviewed his current medications thoroughly.   Pertinent  Medical History  - Non-small cell carcinoma stage IIIa Recurrence in 2019 status post chemo/radiation (right upper lobe) followed by Durvalumab  therapy for 1 year completed in 2020 - Stage III COPD - Nocturnal hypoxemia due to emphysema on nocturnal oxygen - Ischemic cardiomyopathy - Resistant Mycobacterium Avium Complex infection - Multiple lung nodules - Cavitary process right upper lobe progressive since 2020 (post radiation/infection) - Secondary polycythemia - Anxiety and depression - Tobacco dependency due to cigarettes - History of alcohol abuse - SIADH with attendant hyponatremia  Significant Hospital Events: Including procedures, antibiotic start and stop dates in addition to other pertinent events   01/07 admitted for management of refractory COPD exacerbation, CT angio chest no PE 01/08 pulmonary asked to follow-up 01/09 echocardiogram performed, report pending  Interim History / Subjective:  Persistent cough his main complaint today.  Shortness of breath persistent however feels somewhat improved.  Felt hot with antibiotic infusion last evening suspect due to vancomycin .  Objective   Blood pressure 135/88, pulse 99, temperature 98.6 F (37 C), temperature source Oral, resp. rate 20, height 6' (1.829 m), weight 77.1 kg, SpO2 98%.    SpO2: 98 % O2 Flow Rate (L/min): 3 L/min     Intake/Output Summary (Last 24 hours) at 11/10/2023 1328 Last data filed at 11/10/2023 1206 Gross per 24 hour  Intake 347.52 ml  Output 1250 ml  Net -902.48 ml  Filed Weights   11/08/23 1654  Weight: 77.1 kg    Examination: GENERAL: Chronically ill-appearing man, mild tachypnea.  Sitting up in bed.  Mild conversational dyspnea.  Awake and alert.   HEAD: Normocephalic, atraumatic.  EYES:  Pupils equal, round, reactive to light.  No scleral icterus.  MOUTH: Oral mucosa moist.  No thrush. NECK: Supple. No thyromegaly. Trachea midline. No JVD.  No adenopathy. PULMONARY: Good air entry bilaterally. Amphoric sounds on the right upper lung zone.  Scattered wheezes noted.  No rhonchi noted. CARDIOVASCULAR: S1 and S2. Regular rate and rhythm.  No rubs, murmurs or gallops heard. ABDOMEN: Benign. MUSCULOSKELETAL: No joint deformity, no clubbing, no edema.  NEUROLOGIC: No overt focal deficit. Speech is fluent. SKIN: Intact,warm,dry. PSYCH: Depressed mood and flat affect.    Resolved Hospital Problem list     Assessment & Plan:  COPD exacerbation, refractory Hx: Stage III COPD, resistant MAC infection query superimposed infection Continue nebulization treatments: Brovana /Pulmicort  twice a day via neb Yupelri  once a day via neb Albuterol  as needed Continue IV steroids Continue antibiotics MRSA screen negative vancomycin  DC'd  Continue cefepime   Acute on chronic respiratory failure with hypoxia Oxygen supplementation to keep oxygen saturations between 90 to 92% Pulmonary hygiene Acapella flutter valve  Resistant MAC infection Continue therapy ID to follow  Ischemic cardiomyopathy Limited echocardiogram, eval LVEF Await echocardiogram results  Non-small cell lung cancer No obvious evidence of recurrence Residual cavitary lesion postradiation therapy  Tobacco dependence due to cigarettes Has not been motivated to quit Discussed importance of discontinuation of smoking  End-of-life discussion Palliative Care involved   Best Practice (right click and Reselect all SmartList Selections daily)   Diet/type: Regular consistency (see orders) DVT prophylaxis LMWH Pressure ulcer(s): N/A GI prophylaxis: PPI Lines: Central line implanted port on right (Port-A-Cath) Foley:  N/A Code Status:  full code continue discussions to establish goals of care Last date of  multidisciplinary goals of care discussion [11/08/2023 discussed with patient, wife and son during office visit.  11/09/2023 discussed with patient and daughter who was present at bedside]  Labs   CBC: Recent Labs  Lab 11/08/23 1710 11/09/23 0555  WBC 9.7 7.5  NEUTROABS 9.2*  --   HGB 14.3 13.3  HCT 41.3 38.9*  MCV 92.0 91.1  PLT 213 204    Basic Metabolic Panel: Recent Labs  Lab 11/08/23 1710 11/09/23 0555 11/10/23 0405  NA 131* 135 135  K 3.9 4.2 3.9  CL 91* 96* 95*  CO2 32 29 29  GLUCOSE 103* 112* 128*  BUN 12 11 12   CREATININE 0.76 0.66 0.70  CALCIUM  8.6* 8.7* 8.5*   GFR: Estimated Creatinine Clearance: 99.1 mL/min (by C-G formula based on SCr of 0.7 mg/dL). Recent Labs  Lab 11/08/23 1710 11/08/23 1711 11/08/23 2141 11/09/23 0555  PROCALCITON 0.35  --   --   --   WBC 9.7  --   --  7.5  LATICACIDVEN  --  1.3 2.1*  --     Liver Function Tests: Recent Labs  Lab 11/08/23 1710  AST 19  ALT 17  ALKPHOS 77  BILITOT 0.6  PROT 6.3*  ALBUMIN 3.8   No results for input(s): LIPASE, AMYLASE in the last 168 hours. No results for input(s): AMMONIA in the last 168 hours.  ABG No results found for: PHART, PCO2ART, PO2ART, HCO3, TCO2, ACIDBASEDEF, O2SAT   Coagulation Profile: No results for input(s): INR, PROTIME in the last 168 hours.  Cardiac Enzymes: No results for input(s): CKTOTAL,  CKMB, CKMBINDEX, TROPONINI in the last 168 hours.  HbA1C: No results found for: HGBA1C  CBG: No results for input(s): GLUCAP in the last 168 hours.  Review of Systems:   A 10 point review of systems was performed and it is as noted above otherwise negative.  Past Medical History:  He,  has a past medical history of Anginal pain (HCC), Anxiety, Asthma, Chest pain, CHF (congestive heart failure) (HCC), Chicken pox, Complication of anesthesia, COPD (chronic obstructive pulmonary disease) (HCC), Coronary artery disease, Cough, Dysrhythmia,  GERD (gastroesophageal reflux disease), Hematochezia, Hemorrhoids, History of chickenpox, History of colon polyps, History of Helicobacter pylori infection, Hoarseness, Hypertension, Lung cancer (HCC) (05/2016), Migraines, OSA (obstructive sleep apnea), Personal history of tobacco use, presenting hazards to health (03/05/2016), Pneumonia, Raynaud disease, Raynaud disease, Raynaud's disease, Rotator cuff tear, Shortness of breath dyspnea, Sleep apnea, and Ulcer (traumatic) of oral mucosa.   Surgical History:   Past Surgical History:  Procedure Laterality Date   BACK SURGERY     cervical fusion x 2   CARDIAC CATHETERIZATION     CERVICAL DISCECTOMY     x 2   COLONOSCOPY     COLONOSCOPY N/A 07/25/2015   Procedure: COLONOSCOPY;  Surgeon: Gladis RAYMOND Mariner, MD;  Location: [facility] ENDOSCOPY;  Service: Endoscopy;  Laterality: N/A;   COLONOSCOPY WITH PROPOFOL  N/A 10/04/2017   Procedure: COLONOSCOPY WITH PROPOFOL ;  Surgeon: Mariner Gladis RAYMOND, MD;  Location: [facility] ENDOSCOPY;  Service: Endoscopy;  Laterality: N/A;   COLONOSCOPY WITH PROPOFOL  N/A 07/10/2020   Procedure: COLONOSCOPY WITH PROPOFOL ;  Surgeon: Toledo, Ladell POUR, MD;  Location: [facility] ENDOSCOPY;  Service: Gastroenterology;  Laterality: N/A;   ELECTROMAGNETIC NAVIGATION BROCHOSCOPY Left 06/28/2016   Procedure: ELECTROMAGNETIC NAVIGATION BRONCHOSCOPY;  Surgeon: Jorie Cha, MD;  Location: [facility] ORS;  Service: Cardiopulmonary;  Laterality: Left;   ENDOBRONCHIAL ULTRASOUND N/A 04/11/2018   Procedure: ENDOBRONCHIAL ULTRASOUND;  Surgeon: Isaiah Scrivener, MD;  Location: [facility] ORS;  Service: Cardiopulmonary;  Laterality: N/A;   ESOPHAGOGASTRODUODENOSCOPY N/A 07/25/2015   Procedure: ESOPHAGOGASTRODUODENOSCOPY (EGD);  Surgeon: Gladis RAYMOND Mariner, MD;  Location: [facility] ENDOSCOPY;  Service: Endoscopy;  Laterality: N/A;   ESOPHAGOGASTRODUODENOSCOPY (EGD) WITH PROPOFOL  N/A 07/10/2020   Procedure: ESOPHAGOGASTRODUODENOSCOPY (EGD) WITH PROPOFOL ;  Surgeon: Toledo, Ladell POUR, MD;   Location: [facility] ENDOSCOPY;  Service: Gastroenterology;  Laterality: N/A;   NASAL SINUS SURGERY     x 2    PORTA CATH INSERTION N/A 04/24/2018   Procedure: PORTA CATH INSERTION;  Surgeon: Marea Selinda RAMAN, MD;  Location: [facility] INVASIVE CV LAB;  Service: Cardiovascular;  Laterality: N/A;   rotator cuff surgery Right    07/2016   SEPTOPLASTY     SKIN GRAFT       Social History:   reports that he has been smoking cigarettes. He has a 150 pack-year smoking history. He has been exposed to tobacco smoke. He has never used smokeless tobacco. He reports that he does not currently use alcohol after a past usage of about 2.0 standard drinks of alcohol per week. He reports that he does not use drugs.   Family History:  His family history includes Heart attack (age of onset: 69) in his maternal grandfather; Heart disease in his father and paternal grandmother; Prostate cancer in his father. There is no history of Kidney cancer, Bladder Cancer, or Other.   Allergies Allergies  Allergen Reactions   Lisinopril Rash   Varenicline  Rash     Home Medications  Prior to Admission medications   Medication Sig Start Date End Date Taking?  Authorizing Provider  acetaminophen  (TYLENOL ) 500 MG tablet Take 500 mg by mouth daily as needed.   Yes [provider]  albuterol  (PROVENTIL ) (2.5 MG/3ML) 0.083% nebulizer solution TAKE 3 MLS (2.5 MG TOTAL) BY NEBULIZATION 4 (FOUR) TIMES DAILY AS NEEDED FOR WHEEZING OR SHORTNESS OF BREATH. 08/31/23 08/30/24 Yes Tamea Dedra CROME, MD  albuterol  (VENTOLIN  HFA) 108 (90 Base) MCG/ACT inhaler INHALE 2 PUFFS BY MOUTH EVERY 4 HOURS AS NEEDED FOR WHEEZE OR FOR SHORTNESS OF BREATH 07/05/23  Yes Tamea Dedra CROME, MD  amikacin  (AMIKIN ) 1 GM/4ML SOLN injection Inject into the muscle daily. 10/30/23  Yes [provider]  atorvastatin  (LIPITOR) 10 MG tablet Take 10 mg by mouth daily. 08/17/22  Yes [provider]  azithromycin  (ZITHROMAX ) 500 MG tablet TAKE 1 TABLET  (500 MG TOTAL) BY MOUTH DAILY. 10/04/23  Yes Fayette Bodily, MD  BREZTRI  AEROSPHERE 160-9-4.8 MCG/ACT AERO INHALE 2 PUFFS INTO THE LUNGS IN THE MORNING AND AT BEDTIME. 05/20/23  Yes Tamea Dedra CROME, MD  clofazimine  50 mg CAPS capsule (for compassionate use) Take 100 mg by mouth daily with breakfast.   Yes [provider]  clotrimazole  (MYCELEX ) 10 MG troche Take 10 mg by mouth 3 (three) times daily.   Yes [provider]  diazepam  (VALIUM ) 5 MG tablet Take 5 mg by mouth at bedtime as needed. 11/30/21  Yes [provider]  ethambutol  (MYAMBUTOL ) 400 MG tablet Take 3 tablets (1,200 mg total) by mouth daily. 02/21/23  Yes Fayette Bodily, MD  ferrous sulfate  325 (65 FE) MG tablet Take 325 mg by mouth at bedtime.   Yes [provider]  folic acid  (FOLVITE ) 1 MG tablet Take 1 mg by mouth daily. 10/18/23 10/17/24 Yes [provider]  hydrocortisone  (CORTEF ) 10 MG tablet Take two tablets in the morning. Take one tablet in the afternoon (between 2-4pm). 03/03/23  Yes [provider]  levothyroxine  (SYNTHROID ) 175 MCG tablet Take 175 mcg by mouth daily before breakfast.   Yes [provider]  lidocaine -prilocaine  (EMLA ) cream APPLY 1 APPLICATION TOPICALLY AS NEEDED. 04/27/23  Yes [name], Evalene PARAS, MD  magnesium  oxide (MAG-OX) 400 MG tablet Take 800 mg by mouth 2 (two) times daily.   Yes [provider]  melatonin 3 MG TABS tablet Take 3 mg by mouth at bedtime as needed. 05/30/23  Yes [provider]  metoprolol  succinate (TOPROL -XL) 25 MG 24 hr tablet Take 12.5-25 mg by mouth daily as needed (Acute High Blood Pressure). 07/25/23 07/24/24 Yes [provider]  nicotine  (NICODERM CQ  - DOSED IN MG/24 HOURS) 21 mg/24hr patch Place 21 mg onto the skin daily. 06/13/23  Yes [provider]  nitroGLYCERIN  (NITROSTAT ) 0.4 MG SL tablet Place under the tongue. 04/07/21  Yes [provider]  pantoprazole   (PROTONIX ) 40 MG tablet Take 1 tablet (40 mg total) by mouth 2 (two) times daily before meals 02/15/22  Yes [provider]  predniSONE  (DELTASONE ) 20 MG tablet Take by mouth.  Take 2 tablets (40 mg total) by mouth daily for 3 days, THEN 1.5 tablets (30 mg total) daily for 3 days, THEN 1 tablet (20 mg total) daily for 3 days, THEN 0.5 tablets (10 mg total) daily for 3 days.    Instructions: Take 2 tablets (40 mg total) by mouth daily for 3 days, THEN 1.5 tablets (30 mg total) daily for 3 days, THEN 1 tablet (20 mg total) daily for 3 days, THEN 0.5 tablets (10 mg total) daily for 3 days. 11/01/23  11/13/23 Yes [provider]  sodium chloride  1 g tablet Take 1 tablet (1 g total) by mouth 3 (three) times daily with meals. Patient taking differently: Take 5 g by mouth 3 (three) times daily with meals. 09/22/21  Yes Krishnan, Sendil K, MD  tadalafil  (CIALIS ) 5 MG tablet Take 1 tablet (5 mg total) by mouth daily as needed for erectile dysfunction. 12/30/22  Yes McGowan, Clotilda A, PA-C  dronabinol  (MARINOL ) 5 MG capsule Take 1 capsule (5 mg total) by mouth 2 (two) times daily before lunch and supper. Patient not taking: Reported on 11/09/2023 11/23/22   Jacobo Evalene PARAS, MD  losartan (COZAAR) 25 MG tablet Take 25 mg by mouth daily. Patient not taking: Reported on 11/09/2023 07/25/23 07/24/24  [provider]  OXYGEN Inhale 2 L into the lungs at bedtime.    [provider]  rifampin  (RIFADIN ) 300 MG capsule Take 2 capsules (600 mg total) by mouth daily. Patient not taking: Reported on 11/08/2023 02/21/23   Fayette Bodily, MD  ipratropium-albuterol  (DUONEB) 0.5-2.5 (3) MG/3ML SOLN Take 3 mLs by nebulization every 6 (six) hours as needed. 01/22/22 02/09/22  Borders, Fonda SAUNDERS, NP  levalbuterol  (XOPENEX  HFA) 45 MCG/ACT inhaler Inhale 1 puff into the lungs every 6 (six) hours as needed for wheezing. 11/19/21 02/09/22  Kasa, Kurian, MD    Scheduled Meds:  arformoterol   15 mcg  Nebulization BID   atorvastatin   10 mg Oral Daily   azithromycin   500 mg Oral q1800   budesonide  (PULMICORT ) nebulizer solution  0.5 mg Nebulization BID   clofazimine   100 mg Oral Q breakfast   enoxaparin  (LOVENOX ) injection  40 mg Subcutaneous Q24H   ethambutol   1,200 mg Oral Daily   folic acid   1 mg Oral Daily   guaiFENesin   600 mg Oral BID   levothyroxine   175 mcg Oral Q0600   magnesium  oxide  600 mg Oral BID   methylPREDNISolone  (SOLU-MEDROL ) injection  40 mg Intravenous Q12H   nicotine   21 mg Transdermal Daily   pantoprazole   40 mg Oral Daily   revefenacin   175 mcg Nebulization Daily   Continuous Infusions:  amikacin  (AMIKIN ) 750 mg in dextrose  5 % 100 mL IVPB 750 mg (11/10/23 0935)   ceFEPime  (MAXIPIME ) IV 2 g (11/10/23 1510)   PRN Meds:.acetaminophen  **OR** acetaminophen , albuterol , chlorpheniramine-HYDROcodone , diazepam , HYDROcodone -acetaminophen , melatonin, metoprolol  succinate, ondansetron  **OR** ondansetron  (ZOFRAN ) IV   Level 2 follow up    Discussed with Dr. Laneta Blunt in person.  Discussed with Dr. Fayette, ID.  I spent 35 minutes of dedicated to the care of this patient on the date of this encounter to include pre-visit review of records, face-to-face time with the patient discussing conditions above, post visit ordering of medications/interventions, clinical documentation with the electronic health record, making appropriate referrals as documented, and communicating necessary findings to members of the patients care team.   C. Leita Sanders, MD Advanced Bronchoscopy [facility]    *This note was generated using voice recognition software/Dragon and/or AI transcription program.  Despite best efforts to proofread, errors can occur which can change the meaning. Any transcriptional errors that result from this process are unintentional and may not be fully corrected at the time of dictation.

## 2023-11-10 NOTE — Progress Notes (Signed)
 Date of Admission:  11/08/2023      ID: Paul Carpenter is a 68 y.o. male  Principal Problem:   COPD with acute exacerbation (HCC) Active Problems:   Tobacco use disorder   Obstructive sleep apnea   Benign essential hypertension   Carotid artery disease (HCC)   Frequent PVCs   Non-small cell lung cancer, right (HCC)   Hypothyroidism, acquired   SIADH (syndrome of inappropriate ADH production) (HCC)   Hyponatremia   Pulmonary cavitary lesion   Chronic respiratory failure with hypoxia (HCC)   Chronic adrenal insufficiency (HCC)   Acute on chronic hypoxic respiratory failure (HCC)   Mycobacterium avium-intracellulare infection (HCC)    Subjective:  Pt feeling better Not as sob Cough less  Medications:   arformoterol   15 mcg Nebulization BID   atorvastatin   10 mg Oral Daily   azithromycin   500 mg Oral q1800   budesonide  (PULMICORT ) nebulizer solution  0.5 mg Nebulization BID   clofazimine   100 mg Oral Q breakfast   enoxaparin  (LOVENOX ) injection  40 mg Subcutaneous Q24H   ethambutol   1,200 mg Oral Daily   folic acid   1 mg Oral Daily   guaiFENesin   600 mg Oral BID   levothyroxine   175 mcg Oral Q0600   magnesium  oxide  600 mg Oral BID   methylPREDNISolone  (SOLU-MEDROL ) injection  40 mg Intravenous Q12H   nicotine   21 mg Transdermal Daily   pantoprazole   40 mg Oral Daily   revefenacin   175 mcg Nebulization Daily    Objective: Vital signs in last 24 hours: Patient Vitals for the past 24 hrs:  BP Temp Temp src Pulse Resp SpO2  11/10/23 2033 -- -- -- -- -- 98 %  11/10/23 2004 (!) 142/84 97.9 F (36.6 C) -- 87 18 100 %  11/10/23 1600 131/86 97.7 F (36.5 C) Oral 93 18 98 %  11/10/23 1204 135/88 98.6 F (37 C) Oral 99 20 98 %  11/10/23 0738 -- -- -- -- -- 98 %  11/10/23 0723 (!) 148/82 97.9 F (36.6 C) Oral 80 20 92 %  11/10/23 0421 138/83 97.8 F (36.6 C) -- 68 18 98 %  11/10/23 0102 -- -- -- -- -- 99 %  11/10/23 0037 138/89 (!) 97.4 F (36.3 C) -- 81 17 99 %   11/09/23 2234 (!) 142/72 (!) 97.4 F (36.3 C) -- 70 16 98 %     LDA port  PHYSICAL EXAM:  General: Alert, cooperative, no distress, appears stated age.  Lungs: b/l rhonchi. Heart: Regular rate and rhythm, no murmur, rub or gallop. Abdomen: Soft, non-tender,not distended. Bowel sounds normal. No masses Extremities: atraumatic, no cyanosis. No edema. No clubbing Skin: reddish hue Lymph: Cervical, supraclavicular normal. Neurologic: Grossly non-focal  Lab Results    Latest Ref Rng & Units 11/09/2023    5:55 AM 11/08/2023    5:10 PM 09/27/2023   11:30 AM  CBC  WBC 4.0 - 10.5 K/uL 7.5  9.7  9.7   Hemoglobin 13.0 - 17.0 g/dL 86.6  85.6  86.4   Hematocrit 39.0 - 52.0 % 38.9  41.3  38.8   Platelets 150 - 400 K/uL 204  213  247        Latest Ref Rng & Units 11/10/2023    4:05 AM 11/09/2023    5:55 AM 11/08/2023    5:10 PM  CMP  Glucose 70 - 99 mg/dL 871  887  896   BUN 8 - 23 mg/dL 12  11  12   Creatinine 0.61 - 1.24 mg/dL 9.29  9.33  9.23   Sodium 135 - 145 mmol/L 135  135  131   Potassium 3.5 - 5.1 mmol/L 3.9  4.2  3.9   Chloride 98 - 111 mmol/L 95  96  91   CO2 22 - 32 mmol/L 29  29  32   Calcium  8.9 - 10.3 mg/dL 8.5  8.7  8.6   Total Protein 6.5 - 8.1 g/dL   6.3   Total Bilirubin 0.0 - 1.2 mg/dL   0.6   Alkaline Phos 38 - 126 U/L   77   AST 15 - 41 U/L   19   ALT 0 - 44 U/L   17       Microbiology: BC- NG Sputum culture ng  Studies/Results: CT chest  Cavitary upper lobe Emphysema    Assessment/Plan: COPD exacerbation-r/o superadded bacterial infeciton Sputum culture sent on nebulizer, IV solumedrol On IV cefepime    Mycobacterium avium intracellulare infection of the lung Cavitary lesion On IV amikacin  and po ethambutol   azithromycin  and clofazamine  Red hue of skin due to clofazimine    Stage IIIa carcinoma of the lung s/p radiation, chemo and immune therapy- none now'     Adrenal insufficiency -   Hypothyroidism on synthroid    SIADH   Anemia    Current smoker    Discussed the management with the patient

## 2023-11-10 NOTE — Hospital Course (Addendum)
 Hospital course / significant events:   HPI: Paul Carpenter is a 68 y.o. 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 Tamea for IV antibiotics and steroids after failing outpatient management of her COPD exacerbation.  Patient has had progressive shortness of breath for the last 4 to 5 weeks worse over the last 2 to 3 days associated with cough productive of green sputum and occasional hemoptysis.  Has been on prednisone  and amikacin  without much improvement.  Uses O2 at 2 L with O2 sats at home have been 82 to 85% on home flow rate.    01/07: admitted to hospitalist service.CTA chest PE protocol negative for PE, showing known cavitary mass, showing stable pulmonary nodules that are related to his MAC which do show waxing and waning pattern of size change but overall the nodules are stable, CT also of course shows advanced emphysema. Got DuoNebs and Solu-Medrol  and started on cefepime  and vancomycin .  01/08: per ID restart amikacin  and ethambutol  and continue azithromycin  and clofazamine. Per Pulm appears to be some improved with regards to work of breathing but continues to have significant cough and dyspnea, ordered Echo, tussionex, albuterol  nebs, revefenacin  nebs, pulmicort  nebs, aformoterol nebs 01/09-01/10: continue pulmonary treatments and abx, final cultures and fungitell pending. Resp culture rare GPC chains. Echo EF 45-50%. Per ID anticipate dc Mon once completed cefepime  if cx remain neg  01/11-01/12: normal resp flora on resp culture. Fungitell still pending. Running cefepime .      Consultants:  Pulmonology Infectious Disease  Palliative Care   Procedures/Surgeries: none      ASSESSMENT & PLAN:   COPD with acute exacerbation  Resistant MAC with possible superimposed  infection Chronic respiratory failure with hypoxia Patient failing amikacin  and prednisone  outpatient Per pulmonary, he has been in progressive decline for over a year.  Despite his many comorbidities he continues to smoke 1 to 2 packs of cigarettes per day.  No concerns on Echo Per ID - Tx MAC amikacin , ethambutol , azithromycin  and clofazamine. Also on IV cefepime  plan to complete per ID likely Monday and anticipate discharge then if no concerns on culture Per Pulm - albuterol  nebs prn, revefenacin  nebs, pulmicort  nebs bid, aformoterol nebs Continue steroids SoluMedrol 40 mg IV q12h Guaifenesin  scheduled, tussionex prn  Resp culture - likely strep, normal flora, no staph or pseudomonas  fungitell pending    Non-small cell lung cancer, right Cavitary mass/stable pulmonary nodules No acute issues suspected Follow-up with oncology   SIADH (syndrome of inappropriate ADH production) (HCC) Hyponatremia Stable Monitor BMP daily Normal sodium, holding home salt tabs for now   Anxiety/insomnia Chronic benzodiazepine use Home valium  ok for prn at bedtime but concern given higher CO2 on AM labs, if respiratory insufficiency at night is a concern may need d/c benzos and/or higher O2 at bedtime    Chronic adrenal insufficiency Home steroids on hold while getting high-dose steroids here   Benign essential hypertension Continue losartan and metoprolol    Hypothyroidism, acquired Continue levothyroxine    Frequent PVCs Continue metoprolol    Tobacco use disorder Nicotine  patch   Met alk / resp ac     DVT prophylaxis: lovenox   IV fluids: no continuous IV fluids  Nutrition: regular diet Central lines / invasive devices: has port in place  Code Status: FULL CODE ACP documentation reviewed:  none on file in [facility]  TOC needs: none at this time Barriers to dispo / significant pending items:  ID/Pulm clerance prior to dc

## 2023-11-10 NOTE — Progress Notes (Signed)
*  PRELIMINARY RESULTS* Echocardiogram 2D Echocardiogram has been performed.  Carolyne Fiscal 11/10/2023, 12:35 PM

## 2023-11-10 NOTE — Consult Note (Signed)
 Consultation Note Date: 11/10/2023 at 0900  Patient Name: Paul Carpenter  DOB: 20-Oct-1956  MRN: [id number]  Age / Sex: 68 y.o., male  PCP: Sadie Manna, MD Referring Physician: Marsa Edelman, DO  HPI/Patient Profile: 68 y.o. 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   Patient is being followed by ID.  Patient been treated for COPD exacerbation, resistant MAC with possible superimposed infection, and chronic respiratory failure with hypoxia.  PMT was consulted to discuss GOC.   Clinical Assessment and Goals of Care: Extensive chart review completed prior to meeting patient including labs, vital signs, imaging, progress notes, orders, and available advanced directive documents from current and previous encounters. I then met with patient at bedside to discuss diagnosis prognosis, GOC, EOL wishes, disposition and options.  I introduced Palliative Medicine as specialized medical care for people living with serious illness. It focuses on providing relief from the symptoms and stress of a serious illness. The goal is to improve quality of life for both the patient and the family.  We discussed a brief life review of the patient.  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  Before becoming too sick, patient enjoyed fishing, waterskiing, snow skiing, hunting  As far as functional and nutritional status he shares he has a poor appetite.  Since beginning the experimental drug, is feeling nauseous, weak, and extremely tired.  We discussed patient's current illness-compromised respiratory status and AECOPD-and what  it means in the larger context of patient's on-going co-morbidities-MAC, ongoing tobacco use, and NSCLC with cavitary mass.    I attempted to elicit values and goals of care important to the patient.  Patient shares he is looking for some sign of hope that he is going to feel somewhat better.  He shares that doctors continue to said they are not quite sure they have anything else to offer him.  He is hopeful that the medications he is on can give him a glimpse of something positive rather than just hearing all negative things.  Therapeutic silence, active listening, and emotional support provided.  Advance directives, concepts specific to code status, artificial feeding and hydration, and rehospitalization were considered and discussed.  Patient states he has never created an advanced directive but has made his wishes known to his wife.  He shares that he would be accepting of CPR but does not like the idea of himself being on a ventilator.  Discussed difference between DNR and full CODE STATUS.  Extensive education provided on ventilatory support if patient is unable to protect his airway/breathe on his own versus necessity of ventilator and CODE BLUE/cardiopulmonary arrest.  Space and opportunity provided for patient to ask questions and express his thoughts and emotions regarding his current medical situation.  Patient shares he is tired, he does not want to give up, and does not want to let his family down.  He shares they do not want to hear what he has to say but that he is worn out.  Discussed his earthly body being tired when his mind is not ready to give up.  Patient shares he watched several family members passed away from hospice services at end-of-life.  He shares she does not like seeing patients pass away especially watching them be given large amounts of morphine .  Hospice philosophy, aging in place, and utilizing hospice services earlier than that active end-of-life stages discussed in  detail.  Patient appreciated explanation of hospice services before he reaches active dying phase.  He shares he would like for PMT to discuss with his wife.  He is not sure when she will be at the hospital.  I shared patient PMT is available throughout his hospitalization.  We can return at a later date and time to further discuss with he and his wife at his convenience.  No adjustment to [facility] needed at this time.  Full code and full scope remain.  Primary Decision Maker PATIENT  Physical Exam Vitals reviewed.  Constitutional:      General: He is not in acute distress.    Appearance: He is normal weight.  HENT:     Head: Normocephalic.  Eyes:     Pupils: Pupils are equal, round, and reactive to light.  Cardiovascular:     Rate and Rhythm: Normal rate.  Pulmonary:     Breath sounds: Examination of the right-lower field reveals decreased breath sounds. Examination of the left-lower field reveals decreased breath sounds. Decreased breath sounds present.  Chest:     Chest wall: No mass.  Skin:    General: Skin is warm and dry.  Neurological:     Mental Status: He is alert and oriented to person, place, and time.  Psychiatric:        Mood and Affect: Mood normal. Mood is not anxious.        Behavior: Behavior normal. Behavior is not agitated.     Palliative Assessment/Data: 50-60%     Thank you for this consult. Palliative medicine will continue to follow and assist holistically.   Time Total: 75 minutes  Time spent includes: Detailed review of medical records (labs, imaging, vital signs), medically appropriate exam (mental status, respiratory, cardiac, skin), discussed with treatment team, counseling and educating patient, family and staff, documenting clinical information, medication management and coordination of care.  Signed by: Lamarr Gunner, DNP, FNP-BC Palliative Medicine   Please contact Palliative Medicine Team providers via [facility] for questions and concerns.

## 2023-11-11 DIAGNOSIS — C3491 Malignant neoplasm of unspecified part of right bronchus or lung: Secondary | ICD-10-CM | POA: Diagnosis not present

## 2023-11-11 DIAGNOSIS — J441 Chronic obstructive pulmonary disease with (acute) exacerbation: Secondary | ICD-10-CM | POA: Diagnosis not present

## 2023-11-11 DIAGNOSIS — F17210 Nicotine dependence, cigarettes, uncomplicated: Secondary | ICD-10-CM | POA: Diagnosis not present

## 2023-11-11 DIAGNOSIS — Z515 Encounter for palliative care: Secondary | ICD-10-CM | POA: Diagnosis not present

## 2023-11-11 DIAGNOSIS — J9621 Acute and chronic respiratory failure with hypoxia: Secondary | ICD-10-CM | POA: Diagnosis not present

## 2023-11-11 DIAGNOSIS — A310 Pulmonary mycobacterial infection: Secondary | ICD-10-CM

## 2023-11-11 LAB — BASIC METABOLIC PANEL
Anion gap: 9 (ref 5–15)
BUN: 14 mg/dL (ref 8–23)
CO2: 34 mmol/L — ABNORMAL HIGH (ref 22–32)
Calcium: 8.5 mg/dL — ABNORMAL LOW (ref 8.9–10.3)
Chloride: 92 mmol/L — ABNORMAL LOW (ref 98–111)
Creatinine, Ser: 0.78 mg/dL (ref 0.61–1.24)
GFR, Estimated: >60 mL/min (ref >60)
Glucose, Bld: 109 mg/dL — ABNORMAL HIGH (ref 70–99)
Potassium: 4.2 mmol/L (ref 3.5–5.1)
Sodium: 135 mmol/L (ref 135–145)

## 2023-11-11 LAB — CULTURE, RESPIRATORY W GRAM STAIN
Culture: NORMAL
Gram Stain: NONE SEEN
Special Requests: NORMAL

## 2023-11-11 LAB — ECHOCARDIOGRAM LIMITED
Calc EF: 42.8 %
Height: 72 in
S' Lateral: 3.5 cm
Single Plane A2C EF: 38 %
Single Plane A4C EF: 45.2 %
Weight: 2720 [oz_av]

## 2023-11-11 LAB — LEGIONELLA PNEUMOPHILA SEROGP 1 UR AG: L. pneumophila Serogp 1 Ur Ag: NEGATIVE

## 2023-11-11 NOTE — Progress Notes (Signed)
 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  For the details of that visit please refer to that note.  The patient is also followed at [facility] at the [facility] (Paul [facility]) for management of his resistant MAC.  On evaluation 06 Nov 2022 the patient was noted to be in moderate respiratory distress with significant bronchospasm noted.  He had failed outpatient therapy for COPD exacerbation (prednisone  taper and antibiotics ordered by [facility]).  Recently he was started on clofazimine  on compassionate use protocol for his MAC.  He is also on azithromycin , amikacin  and ethambutol .  The patient was referred for evaluation to ED for subsequent admission.  CT angio chest performed yesterday did not show pulmonary emboli.  The patient does have SEVERE emphysema/COPD noted on the scan.  He has the aforementioned cavitary process of his right upper lobe that is unchanged from prior.  There are lung nodules noted that are related to his MAC these do show waxing and waning pattern of size change.  Overall the nodules are stable.  The patient has been in progressive decline for over a year.  Despite his many comorbidities he continues to smoke 1 to 2 packs of cigarettes per day.  I was asked to follow-up with the patient at the request of Dr. Ban, I have discussed the case thoroughly with him.  On reevaluation today, the  patient appears to be somewhat improved with regards to work of breathing.  He continues to have significant cough and dyspnea.  I have reviewed his current medications thoroughly.   Pertinent  Medical History  - Non-small cell carcinoma stage IIIa Recurrence in 2019 status post chemo/radiation (right upper lobe) followed by Durvalumab  therapy for 1 year completed in 2020 - Stage III COPD - Nocturnal hypoxemia due to emphysema on nocturnal oxygen - Ischemic cardiomyopathy - Resistant Mycobacterium Avium Complex infection - Multiple lung nodules - Cavitary process right upper lobe progressive since 2020 (post radiation/infection) - Secondary polycythemia - Anxiety and depression - Tobacco dependency due to cigarettes - History of alcohol abuse - SIADH with attendant hyponatremia  Significant Hospital Events: Including procedures, antibiotic start and stop dates in addition to other pertinent events   01/07 admitted for management of refractory COPD exacerbation, CT angio chest no PE 01/08 pulmonary asked to follow-up 01/09 echocardiogram performed, report pending 01/10 echocardiogram final read LVEF 45%  Interim History / Subjective:  Feels better overall today.  Less work of breathing.  Feels nebulizers are helping.  Objective   Blood pressure 133/81, pulse 79, temperature 98.1 F (36.7 C), temperature source Oral, resp. rate 16, height 6' (1.829 m), weight 77.1 kg, SpO2 97%.    SpO2: 97 % O2 Flow Rate (L/min): 2 L/min     Intake/Output Summary (Last 24 hours) at 11/11/2023 1340 Last data filed at 11/11/2023 0539 Gross per 24 hour  Intake 639.9 ml  Output 325 ml  Net 314.9 ml   [employer]  11/08/23 1654  Weight: 77.1 kg    Examination: GENERAL: Chronically ill-appearing Carpenter, mild tachypnea.  Sitting up in chair.  No conversational dyspnea.  Awake and alert.   HEAD: Normocephalic, atraumatic.  EYES: Pupils equal, round, reactive to light.  No scleral icterus.   MOUTH: Oral mucosa moist.  No thrush. NECK: Supple. No thyromegaly. Trachea midline. No JVD.  No adenopathy. PULMONARY: Good air entry bilaterally. Amphoric sounds on the right upper lung zone.  Scattered rhonchi noted.  No wheezes noted. CARDIOVASCULAR: S1 and S2. Regular rate and rhythm.  No rubs, murmurs or gallops heard. ABDOMEN: Benign. MUSCULOSKELETAL: No joint deformity, no clubbing, no edema.  NEUROLOGIC: No overt focal deficit. Speech is fluent. SKIN: Intact,warm,dry. PSYCH: Depressed mood and flat affect.    Resolved Hospital Problem list     Assessment & Plan:  COPD exacerbation, refractory Hx: Stage III COPD, resistant MAC infection query superimposed infection Continue nebulization treatments: Brovana /Pulmicort  twice a day via neb Yupelri  once a day via neb Albuterol  as needed Continue IV steroids Continue antibiotics MRSA screen negative, vancomycin  DC'd  Continue cefepime   Acute on chronic respiratory failure with hypoxia Oxygen supplementation to keep oxygen saturations between 90 to 92% Pulmonary hygiene Acapella flutter valve  Resistant MAC infection Continue therapy Ethambutol , amikacin , azithromycin , clofazimine   ID following  Ischemic cardiomyopathy Limited echocardiogram, eval LVEF LVEF stable at 45%  Non-small cell lung cancer No obvious evidence of recurrence Residual cavitary lesion postradiation therapy  Tobacco dependence due to cigarettes Has not been motivated to quit Discussed importance of discontinuation of smoking Nicotine  replacement therapy Abstinence from cigarettes  End-of-life discussion Palliative Care involved   Best Practice (right click and Reselect all SmartList Selections daily)   Diet/type: Regular consistency (see orders) DVT prophylaxis LMWH Pressure ulcer(s): N/A GI prophylaxis: PPI Lines: Central line implanted port on right (Port-A-Cath) Foley:  N/A Code Status:  full code continue discussions to  establish goals of care Last date of multidisciplinary goals of care discussion [11/08/2023 discussed with patient, wife and son during office visit.  11/09/2023 discussed with patient and daughter who was present at bedside]  Labs   CBC: Recent Labs  Lab 11/08/23 1710 11/09/23 0555  WBC 9.7 7.5  NEUTROABS 9.2*  --   HGB 14.3 13.3  HCT 41.3 38.9*  MCV 92.0 91.1  PLT 213 204    Basic Metabolic Panel: Recent Labs  Lab 11/08/23 1710 11/09/23 0555 11/10/23 0405 11/11/23 0605  NA 131* 135 135 135  K 3.9 4.2 3.9 4.2  CL 91* 96* 95* 92*  CO2 32 29 29 34*  GLUCOSE 103* 112* 128* 109*  BUN 12 11 12 14   CREATININE 0.76 0.66 0.70 0.78  CALCIUM  8.6* 8.7* 8.5* 8.5*   GFR: Estimated Creatinine Clearance: 99.1 mL/min (by C-G formula based on SCr of 0.78 mg/dL). Recent Labs  Lab 11/08/23 1710 11/08/23 1711 11/08/23 2141 11/09/23 0555  PROCALCITON 0.35  --   --   --   WBC 9.7  --   --  7.5  LATICACIDVEN  --  1.3 2.1*  --     Liver Function Tests: Recent Labs  Lab 11/08/23 1710  AST 19  ALT 17  ALKPHOS 77  BILITOT 0.6  PROT 6.3*  ALBUMIN 3.8   No results for input(s): LIPASE, AMYLASE in the last 168 hours. No results for input(s): AMMONIA in the last 168 hours.  ABG No results found for: PHART, PCO2ART, PO2ART, HCO3, TCO2, ACIDBASEDEF, O2SAT   Coagulation Profile: No results  for input(s): INR, PROTIME in the last 168 hours.  Cardiac Enzymes: No results for input(s): CKTOTAL, CKMB, CKMBINDEX, TROPONINI in the last 168 hours.  HbA1C: No results found for: HGBA1C  CBG: No results for input(s): GLUCAP in the last 168 hours.  Review of Systems:   A 10 point review of systems was performed and it is as noted above otherwise negative.  Past Medical History:  He,  has a past medical history of Anginal pain (HCC), Anxiety, Asthma, Chest pain, CHF (congestive heart failure) (HCC), Chicken pox, Complication of anesthesia, COPD  (chronic obstructive pulmonary disease) (HCC), Coronary artery disease, Cough, Dysrhythmia, GERD (gastroesophageal reflux disease), Hematochezia, Hemorrhoids, History of chickenpox, History of colon polyps, History of Helicobacter pylori infection, Hoarseness, Hypertension, Lung cancer (HCC) (05/2016), Migraines, OSA (obstructive sleep apnea), Personal history of tobacco use, presenting hazards to health (03/05/2016), Pneumonia, Raynaud disease, Raynaud disease, Raynaud's disease, Rotator cuff tear, Shortness of breath dyspnea, Sleep apnea, and Ulcer (traumatic) of oral mucosa.   Surgical History:   Past Surgical History:  Procedure Laterality Date   BACK SURGERY     cervical fusion x 2   CARDIAC CATHETERIZATION     CERVICAL DISCECTOMY     x 2   COLONOSCOPY     COLONOSCOPY N/A 07/25/2015   Procedure: COLONOSCOPY;  Surgeon: Gladis RAYMOND Mariner, MD;  Location: [facility] ENDOSCOPY;  Service: Endoscopy;  Laterality: N/A;   COLONOSCOPY WITH PROPOFOL  N/A 10/04/2017   Procedure: COLONOSCOPY WITH PROPOFOL ;  Surgeon: Mariner Gladis RAYMOND, MD;  Location: [facility] ENDOSCOPY;  Service: Endoscopy;  Laterality: N/A;   COLONOSCOPY WITH PROPOFOL  N/A 07/10/2020   Procedure: COLONOSCOPY WITH PROPOFOL ;  Surgeon: Toledo, Ladell POUR, MD;  Location: [facility] ENDOSCOPY;  Service: Gastroenterology;  Laterality: N/A;   ELECTROMAGNETIC NAVIGATION BROCHOSCOPY Left 06/28/2016   Procedure: ELECTROMAGNETIC NAVIGATION BRONCHOSCOPY;  Surgeon: Jorie Cha, MD;  Location: [facility] ORS;  Service: Cardiopulmonary;  Laterality: Left;   ENDOBRONCHIAL ULTRASOUND N/A 04/11/2018   Procedure: ENDOBRONCHIAL ULTRASOUND;  Surgeon: Isaiah Scrivener, MD;  Location: [facility] ORS;  Service: Cardiopulmonary;  Laterality: N/A;   ESOPHAGOGASTRODUODENOSCOPY N/A 07/25/2015   Procedure: ESOPHAGOGASTRODUODENOSCOPY (EGD);  Surgeon: Gladis RAYMOND Mariner, MD;  Location: [facility] ENDOSCOPY;  Service: Endoscopy;  Laterality: N/A;   ESOPHAGOGASTRODUODENOSCOPY (EGD) WITH PROPOFOL  N/A 07/10/2020    Procedure: ESOPHAGOGASTRODUODENOSCOPY (EGD) WITH PROPOFOL ;  Surgeon: Toledo, Ladell POUR, MD;  Location: [facility] ENDOSCOPY;  Service: Gastroenterology;  Laterality: N/A;   NASAL SINUS SURGERY     x 2    PORTA CATH INSERTION N/A 04/24/2018   Procedure: PORTA CATH INSERTION;  Surgeon: Marea Selinda RAMAN, MD;  Location: [facility] INVASIVE CV LAB;  Service: Cardiovascular;  Laterality: N/A;   rotator cuff surgery Right    07/2016   SEPTOPLASTY     SKIN GRAFT       Social History:   reports that he has been smoking cigarettes. He has a 150 pack-year smoking history. He has been exposed to tobacco smoke. He has never used smokeless tobacco. He reports that he does not currently use alcohol after a past usage of about 2.0 standard drinks of alcohol per week. He reports that he does not use drugs.   Family History:  His family history includes Heart attack (age of onset: 46) in his maternal grandfather; Heart disease in his father and paternal grandmother; Prostate cancer in his father. There is no history of Kidney cancer, Bladder Cancer, or Other.   Allergies Allergies  Allergen Reactions   Lisinopril Rash   Varenicline  Rash  Home Medications  Prior to Admission medications   Medication Sig Start Date End Date Taking? Authorizing Provider  acetaminophen  (TYLENOL ) 500 MG tablet Take 500 mg by mouth daily as needed.   Yes [provider]  albuterol  (PROVENTIL ) (2.5 MG/3ML) 0.083% nebulizer solution TAKE 3 MLS (2.5 MG TOTAL) BY NEBULIZATION 4 (FOUR) TIMES DAILY AS NEEDED FOR WHEEZING OR SHORTNESS OF BREATH. 08/31/23 08/30/24 Yes Tamea Dedra CROME, MD  albuterol  (VENTOLIN  HFA) 108 (90 Base) MCG/ACT inhaler INHALE 2 PUFFS BY MOUTH EVERY 4 HOURS AS NEEDED FOR WHEEZE OR FOR SHORTNESS OF BREATH 07/05/23  Yes Tamea Dedra CROME, MD  amikacin  (AMIKIN ) 1 GM/4ML SOLN injection Inject into the muscle daily. 10/30/23  Yes [provider]  atorvastatin  (LIPITOR) 10 MG tablet Take 10 mg by mouth daily.  08/17/22  Yes [provider]  azithromycin  (ZITHROMAX ) 500 MG tablet TAKE 1 TABLET (500 MG TOTAL) BY MOUTH DAILY. 10/04/23  Yes Fayette Bodily, MD  BREZTRI  AEROSPHERE 160-9-4.8 MCG/ACT AERO INHALE 2 PUFFS INTO THE LUNGS IN THE MORNING AND AT BEDTIME. 05/20/23  Yes Tamea Dedra CROME, MD  clofazimine  50 mg CAPS capsule (for compassionate use) Take 100 mg by mouth daily with breakfast.   Yes [provider]  clotrimazole  (MYCELEX ) 10 MG troche Take 10 mg by mouth 3 (three) times daily.   Yes [provider]  diazepam  (VALIUM ) 5 MG tablet Take 5 mg by mouth at bedtime as needed. 11/30/21  Yes [provider]  ethambutol  (MYAMBUTOL ) 400 MG tablet Take 3 tablets (1,200 mg total) by mouth daily. 02/21/23  Yes Fayette Bodily, MD  ferrous sulfate  325 (65 FE) MG tablet Take 325 mg by mouth at bedtime.   Yes [provider]  folic acid  (FOLVITE ) 1 MG tablet Take 1 mg by mouth daily. 10/18/23 10/17/24 Yes [provider]  hydrocortisone  (CORTEF ) 10 MG tablet Take two tablets in the morning. Take one tablet in the afternoon (between 2-4pm). 03/03/23  Yes [provider]  levothyroxine  (SYNTHROID ) 175 MCG tablet Take 175 mcg by mouth daily before breakfast.   Yes [provider]  lidocaine -prilocaine  (EMLA ) cream APPLY 1 APPLICATION TOPICALLY AS NEEDED. 04/27/23  Yes [name], Evalene PARAS, MD  magnesium  oxide (MAG-OX) 400 MG tablet Take 800 mg by mouth 2 (two) times daily.   Yes [provider]  melatonin 3 MG TABS tablet Take 3 mg by mouth at bedtime as needed. 05/30/23  Yes [provider]  metoprolol  succinate (TOPROL -XL) 25 MG 24 hr tablet Take 12.5-25 mg by mouth daily as needed (Acute High Blood Pressure). 07/25/23 07/24/24 Yes [provider]  nicotine  (NICODERM CQ  - DOSED IN MG/24 HOURS) 21 mg/24hr patch Place 21 mg onto the skin daily. 06/13/23  Yes [provider]  nitroGLYCERIN  (NITROSTAT )  0.4 MG SL tablet Place under the tongue. 04/07/21  Yes [provider]  pantoprazole  (PROTONIX ) 40 MG tablet Take 1 tablet (40 mg total) by mouth 2 (two) times daily before meals 02/15/22  Yes [provider]  predniSONE  (DELTASONE ) 20 MG tablet Take by mouth.  Take 2 tablets (40 mg total) by mouth daily for 3 days, THEN 1.5 tablets (30 mg total) daily for 3 days, THEN 1 tablet (20 mg total) daily for 3 days, THEN 0.5 tablets (10 mg total) daily for 3 days.    Instructions: Take 2 tablets (40 mg total) by mouth daily for 3 days, THEN 1.5 tablets (30 mg total) daily for 3 days, THEN 1 tablet (20 mg  total) daily for 3 days, THEN 0.5 tablets (10 mg total) daily for 3 days. 11/01/23 11/13/23 Yes [provider]  sodium chloride  1 g tablet Take 1 tablet (1 g total) by mouth 3 (three) times daily with meals. Patient taking differently: Take 5 g by mouth 3 (three) times daily with meals. 09/22/21  Yes Krishnan, Sendil K, MD  tadalafil  (CIALIS ) 5 MG tablet Take 1 tablet (5 mg total) by mouth daily as needed for erectile dysfunction. 12/30/22  Yes McGowan, Clotilda A, PA-C  dronabinol  (MARINOL ) 5 MG capsule Take 1 capsule (5 mg total) by mouth 2 (two) times daily before lunch and supper. Patient not taking: Reported on 11/09/2023 11/23/22   Jacobo Evalene PARAS, MD  losartan (COZAAR) 25 MG tablet Take 25 mg by mouth daily. Patient not taking: Reported on 11/09/2023 07/25/23 07/24/24  [provider]  OXYGEN Inhale 2 L into the lungs at bedtime.    [provider]  rifampin  (RIFADIN ) 300 MG capsule Take 2 capsules (600 mg total) by mouth daily. Patient not taking: Reported on 11/08/2023 02/21/23   Fayette Bodily, MD  ipratropium-albuterol  (DUONEB) 0.5-2.5 (3) MG/3ML SOLN Take 3 mLs by nebulization every 6 (six) hours as needed. 01/22/22 02/09/22  Borders, Fonda SAUNDERS, NP  levalbuterol  (XOPENEX  HFA) 45 MCG/ACT inhaler Inhale 1 puff into the lungs every 6 (six) hours as needed for  wheezing. 11/19/21 02/09/22  Kasa, Kurian, MD    Scheduled Meds:  arformoterol   15 mcg Nebulization BID   atorvastatin   10 mg Oral Daily   azithromycin   500 mg Oral q1800   budesonide  (PULMICORT ) nebulizer solution  0.5 mg Nebulization BID   clofazimine   100 mg Oral Q breakfast   enoxaparin  (LOVENOX ) injection  40 mg Subcutaneous Q24H   ethambutol   1,200 mg Oral Daily   folic acid   1 mg Oral Daily   guaiFENesin   600 mg Oral BID   levothyroxine   175 mcg Oral Q0600   magnesium  oxide  600 mg Oral BID   methylPREDNISolone  (SOLU-MEDROL ) injection  40 mg Intravenous Q12H   nicotine   21 mg Transdermal Daily   pantoprazole   40 mg Oral Daily   revefenacin   175 mcg Nebulization Daily   Continuous Infusions:  amikacin  (AMIKIN ) 750 mg in dextrose  5 % 100 mL IVPB 750 mg (11/11/23 1017)   ceFEPime  (MAXIPIME ) IV 2 g (11/11/23 0530)   PRN Meds:.acetaminophen  **OR** acetaminophen , albuterol , chlorpheniramine-HYDROcodone , diazepam , HYDROcodone -acetaminophen , melatonin, metoprolol  succinate, ondansetron  **OR** ondansetron  (ZOFRAN ) IV   Level 2 follow up    Discussed with Dr. Fayette, ID.  Updated patient and family at bedside.  Will check back on Monday 13 January.  Dr. Belva November on call for [facility] [place] for any questions, concerns.  I spent 35 minutes of dedicated to the care of this patient on the date of this encounter to include pre-visit review of records, face-to-face time with the patient discussing conditions above, post visit ordering of medications/interventions, clinical documentation with the electronic health record, making appropriate referrals as documented, and communicating necessary findings to members of the patients care team.   C. Leita Sanders, MD Advanced Bronchoscopy [facility]-    *This note was generated using voice recognition software/Dragon and/or AI transcription program.  Despite best efforts to proofread, errors  can occur which can change the meaning. Any transcriptional errors that result from this process are unintentional and may not be fully corrected at the time of dictation.

## 2023-11-11 NOTE — Plan of Care (Signed)
  Problem: Education: Goal: Knowledge of General Education information will improve Description: Including pain rating scale, medication(s)/side effects and non-pharmacologic comfort measures Outcome: Progressing   Problem: Health Behavior/Discharge Planning: Goal: Ability to manage health-related needs will improve Outcome: Progressing   Problem: Clinical Measurements: Goal: Ability to maintain clinical measurements within normal limits will improve Outcome: Progressing Goal: Will remain free from infection Outcome: Progressing Goal: Respiratory complications will improve Outcome: Progressing Goal: Cardiovascular complication will be avoided Outcome: Progressing   Problem: Activity: Goal: Risk for activity intolerance will decrease Outcome: Progressing   Problem: Coping: Goal: Level of anxiety will decrease Outcome: Progressing   Problem: Elimination: Goal: Will not experience complications related to bowel motility Outcome: Progressing Goal: Will not experience complications related to urinary retention Outcome: Progressing   Problem: Pain Management: Goal: General experience of comfort will improve Outcome: Progressing   Problem: Safety: Goal: Ability to remain free from injury will improve Outcome: Progressing   Problem: Skin Integrity: Goal: Risk for impaired skin integrity will decrease Outcome: Progressing   Problem: Education: Goal: Knowledge of disease or condition will improve Outcome: Progressing Goal: Knowledge of the prescribed therapeutic regimen will improve Outcome: Progressing Goal: Individualized Educational Video(s) Outcome: Progressing   Problem: Activity: Goal: Ability to tolerate increased activity will improve Outcome: Progressing Goal: Will verbalize the importance of balancing activity with adequate rest periods Outcome: Progressing   Problem: Respiratory: Goal: Ability to maintain a clear airway will improve Outcome:  Progressing Goal: Levels of oxygenation will improve Outcome: Progressing Goal: Ability to maintain adequate ventilation will improve Outcome: Progressing   Problem: Activity: Goal: Ability to tolerate increased activity will improve Outcome: Progressing   Problem: Clinical Measurements: Goal: Ability to maintain a body temperature in the normal range will improve Outcome: Progressing   Problem: Respiratory: Goal: Ability to maintain adequate ventilation will improve Outcome: Progressing Goal: Ability to maintain a clear airway will improve Outcome: Progressing

## 2023-11-11 NOTE — Plan of Care (Signed)

## 2023-11-11 NOTE — Progress Notes (Signed)
 PROGRESS NOTE    Paul Carpenter   FMW:[id number] DOB: 1955-12-04  DOA: 11/08/2023 Date of Service: 11/11/23 which is hospital day 3  PCP: Sadie Manna, MD    Hospital course / significant events:   HPI: Paul Carpenter is a 68 y.o. 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 Tamea for IV antibiotics and steroids after failing outpatient management of her COPD exacerbation.  Patient has had progressive shortness of breath for the last 4 to 5 weeks worse over the last 2 to 3 days associated with cough productive of green sputum and occasional hemoptysis.  Has been on prednisone  and amikacin  without much improvement.  Uses O2 at 2 L with O2 sats at home have been 82 to 85% on home flow rate.    01/07: admitted to hospitalist service.CTA chest PE protocol negative for PE, showing known cavitary mass, showing stable pulmonary nodules that are related to his MAC which do show waxing and waning pattern of size change but overall the nodules are stable, CT also of course shows advanced emphysema. Got DuoNebs and Solu-Medrol  and started on cefepime  and vancomycin .  01/08: per ID restart amikacin  and ethambutol  and continue azithromycin  and clofazamine. Per Pulm appears to be some improved with regards to work of breathing but continues to have significant cough and dyspnea, ordered Echo, tussionex, albuterol  nebs, revefenacin  nebs, pulmicort  nebs, aformoterol nebs 01/09-01/10: continue pulmonary treatments and abx, echo pending, cultures and fungitell pending. 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  Despite his many comorbidities he continues to smoke 1 to 2 packs of cigarettes per day.  Per ID - restart amikacin  and ethambutol , continue azithromycin  and clofazamine  Per Pulm - ordered Echo, tussionex, albuterol  nebs, revefenacin  nebs, pulmicort  nebs, aformoterol nebs Continue steroids SoluMedrol 40 mg IV q12h  Guaifenesin  scheduled, tussionex prn  Cultures/fungitell pending    Non-small cell lung cancer, right Cavitary mass/stable pulmonary nodules No acute issues suspected Follow-up with oncology Pt requested port-a-cath de access    SIADH (syndrome of inappropriate ADH production) (HCC) Hyponatremia Stable Monitor BMP daily Normal sodium, holding home salt tabs for now   Anxiety/insomnia Chronic benzodiazepine use Home valium  ok for prn at bedtime but concern given higher CO2 on AM labs, if respiratory insufficiency at night is a concern may need d/c benzos and/or higher O2 at bedtime    Chronic adrenal insufficiency Home steroids on hold while getting high-dose steroids here   Benign essential hypertension Continue losartan and metoprolol    Hypothyroidism, acquired Continue levothyroxine    Frequent PVCs Continue metoprolol    Tobacco use disorder Nicotine  patch       DVT prophylaxis: lovenox   IV fluids: no continuous IV fluids  Nutrition: regular diet Central lines / invasive devices: has port in place  Code Status: FULL CODE ACP documentation reviewed:  none on file in [facility]  TOC needs: none at this time Barriers to dispo / significant pending items: await echo, clinical improvement, ID/Pulm clerance  prior to dc              Subjective / Brief ROS:  Patient reports cough about same, not getting much out. Would like to get up and walk more  Denies CP/SOB at rest Pain  controlled.  Tolerating diet.   Family Communication: family at bedside non rounds including his wife Robin     Objective Findings:  Vitals:   11/10/23 2033 11/11/23 0418 11/11/23 0744 11/11/23 1208  BP:  122/66 (!) 143/83 133/81  Pulse:  78 80 79  Resp:  18 16 16   Temp:  97.8 F (36.6 C) 97.9 F (36.6 C) 98.1 F (36.7 C)  TempSrc:  Oral Oral Oral  SpO2: 98% 93% 99% 97%  Weight:      Height:        Intake/Output Summary (Last 24 hours) at 11/11/2023 1329 Last data filed at 11/11/2023 0539 Gross per 24 hour  Intake 639.9 ml  Output 325 ml  Net 314.9 ml   Filed Weights   11/08/23 1654  Weight: 77.1 kg    Examination:  Physical Exam Constitutional:      General: He is not in acute distress. Cardiovascular:     Rate and Rhythm: Normal rate and regular rhythm.  Pulmonary:     Effort: Pulmonary effort is normal.     Breath sounds: Wheezing present.  Musculoskeletal:     Right lower leg: No edema.     Left lower leg: No edema.  Neurological:     General: No focal deficit present.     Mental Status: He is alert and oriented to person, place, and time.  Psychiatric:        Mood and Affect: Mood normal.        Behavior: Behavior normal.          Scheduled Medications:   arformoterol   15 mcg Nebulization BID   atorvastatin   10 mg Oral Daily   azithromycin   500 mg Oral q1800   budesonide  (PULMICORT ) nebulizer solution  0.5 mg Nebulization BID   clofazimine   100 mg Oral Q breakfast   enoxaparin  (LOVENOX ) injection  40 mg Subcutaneous Q24H   ethambutol   1,200 mg Oral Daily   folic acid   1 mg Oral Daily   guaiFENesin   600 mg Oral BID   levothyroxine   175 mcg Oral Q0600   magnesium  oxide  600 mg Oral BID   methylPREDNISolone  (SOLU-MEDROL ) injection  40 mg Intravenous Q12H   nicotine   21 mg Transdermal Daily   pantoprazole   40 mg Oral Daily   revefenacin   175 mcg Nebulization Daily    Continuous Infusions:  amikacin  (AMIKIN ) 750 mg in dextrose  5 % 100 mL  IVPB 750 mg (11/11/23 1017)   ceFEPime  (MAXIPIME ) IV 2 g (11/11/23 0530)    PRN Medications:  acetaminophen  **OR** acetaminophen , albuterol , chlorpheniramine-HYDROcodone , diazepam , HYDROcodone -acetaminophen , melatonin, metoprolol  succinate, ondansetron  **OR** ondansetron  (ZOFRAN ) IV  Antimicrobials from admission:  Anti-infectives (From admission, onward)    Start     Dose/Rate Route Frequency Ordered Stop   11/10/23 1800  azithromycin  (ZITHROMAX ) tablet 500 mg        500 mg Oral Daily-1800 11/10/23 1435     11/10/23 1000  ethambutol  (MYAMBUTOL ) tablet 1,200 mg        1,200 mg Oral Daily 11/09/23 1900     11/10/23 0800  clofazimine  50 mg capsule (for compassionate use)  Status:  Discontinued        100 mg Oral Daily with breakfast  11/09/23 0904 11/09/23 1237   11/10/23 0800  amikacin  (AMIKIN ) 750 mg in dextrose  5 % 100 mL IVPB        750 mg 103 mL/hr over 60 Minutes Intravenous Every 24 hours 11/09/23 1900     11/09/23 1245  clofazimine  50 mg capsule (for compassionate use)        100 mg Oral Daily with breakfast 11/09/23 1237     11/09/23 1000  ethambutol  (MYAMBUTOL ) tablet 1,200 mg  Status:  Discontinued        1,200 mg Oral Daily 11/09/23 0904 11/09/23 0914   11/09/23 0900  vancomycin  (VANCOREADY) IVPB 1250 mg/250 mL  Status:  Discontinued        1,250 mg 166.7 mL/hr over 90 Minutes Intravenous Every 12 hours 11/08/23 2351 11/09/23 1857   11/09/23 0300  ceFEPIme  (MAXIPIME ) 2 g in sodium chloride  0.9 % 100 mL IVPB        2 g 200 mL/hr over 30 Minutes Intravenous Every 8 hours 11/08/23 2330 11/13/23 2159   11/08/23 2345  azithromycin  (ZITHROMAX ) 500 mg in sodium chloride  0.9 % 250 mL IVPB  Status:  Discontinued        500 mg 250 mL/hr over 60 Minutes Intravenous Every 24 hours 11/08/23 2330 11/10/23 1435   11/08/23 1900  ceFEPIme  (MAXIPIME ) 2 g in sodium chloride  0.9 % 100 mL IVPB        2 g 200 mL/hr over 30 Minutes Intravenous  Once 11/08/23 1834 11/08/23 2036   11/08/23 1900   vancomycin  (VANCOREADY) IVPB 1750 mg/350 mL        1,750 mg 175 mL/hr over 120 Minutes Intravenous  Once 11/08/23 1834 11/08/23 2325           Data Reviewed:  I have personally reviewed the following...  CBC: Recent Labs  Lab 11/08/23 1710 11/09/23 0555  WBC 9.7 7.5  NEUTROABS 9.2*  --   HGB 14.3 13.3  HCT 41.3 38.9*  MCV 92.0 91.1  PLT 213 204   Basic Metabolic Panel: Recent Labs  Lab 11/08/23 1710 11/09/23 0555 11/10/23 0405 11/11/23 0605  NA 131* 135 135 135  K 3.9 4.2 3.9 4.2  CL 91* 96* 95* 92*  CO2 32 29 29 34*  GLUCOSE 103* 112* 128* 109*  BUN 12 11 12 14   CREATININE 0.76 0.66 0.70 0.78  CALCIUM  8.6* 8.7* 8.5* 8.5*   GFR: Estimated Creatinine Clearance: 99.1 mL/min (by C-G formula based on SCr of 0.78 mg/dL). Liver Function Tests: Recent Labs  Lab 11/08/23 1710  AST 19  ALT 17  ALKPHOS 77  BILITOT 0.6  PROT 6.3*  ALBUMIN 3.8   No results for input(s): LIPASE, AMYLASE in the last 168 hours. No results for input(s): AMMONIA in the last 168 hours. Coagulation Profile: No results for input(s): INR, PROTIME in the last 168 hours. Cardiac Enzymes: No results for input(s): CKTOTAL, CKMB, CKMBINDEX, TROPONINI in the last 168 hours. BNP (last 3 results) No results for input(s): PROBNP in the last 8760 hours. HbA1C: No results for input(s): HGBA1C in the last 72 hours. CBG: No results for input(s): GLUCAP in the last 168 hours. Lipid Profile: No results for input(s): CHOL, HDL, LDLCALC, TRIG, CHOLHDL, LDLDIRECT in the last 72 hours. Thyroid  Function Tests: No results for input(s): TSH, T4TOTAL, FREET4, T3FREE, THYROIDAB in the last 72 hours. Anemia Panel: No results for input(s): VITAMINB12, FOLATE, FERRITIN, TIBC, IRON , RETICCTPCT in the last 72 hours. Most Recent Urinalysis On File:  Component Value Date/Time   COLORURINE YELLOW (A) 02/09/2023 2128   APPEARANCEUR CLEAR (A)  02/09/2023 2128   APPEARANCEUR Clear 08/15/2020 0951   LABSPEC 1.013 02/09/2023 2128   LABSPEC 1.004 01/26/2012 0944   PHURINE 6.0 02/09/2023 2128   GLUCOSEU NEGATIVE 02/09/2023 2128   GLUCOSEU Negative 01/26/2012 0944   HGBUR NEGATIVE 02/09/2023 2128   BILIRUBINUR NEGATIVE 02/09/2023 2128   BILIRUBINUR Negative 08/15/2020 0951   BILIRUBINUR Negative 01/26/2012 0944   KETONESUR NEGATIVE 02/09/2023 2128   PROTEINUR NEGATIVE 02/09/2023 2128   NITRITE NEGATIVE 02/09/2023 2128   LEUKOCYTESUR NEGATIVE 02/09/2023 2128   LEUKOCYTESUR Negative 01/26/2012 0944   Sepsis Labs: @LABRCNTIP (procalcitonin:4,lacticidven:4) Microbiology: Recent Results (from the past 240 hours)  Blood culture (routine x 2)     Status: None (Preliminary result)   Collection Time: 11/08/23  5:10 PM   Specimen: BLOOD  Result Value Ref Range Status   Specimen Description BLOOD BLOOD LEFT HAND  Final   Special Requests   Final    BOTTLES DRAWN AEROBIC AND ANAEROBIC Blood Culture adequate volume   Culture   Final    NO GROWTH 3 DAYS Performed at [facility], [address], [place], [place] [address]    Report Status PENDING  Incomplete  Blood culture (routine x 2)     Status: None (Preliminary result)   Collection Time: 11/08/23  5:15 PM   Specimen: BLOOD  Result Value Ref Range Status   Specimen Description BLOOD BLOOD RIGHT ARM  Final   Special Requests   Final    BOTTLES DRAWN AEROBIC AND ANAEROBIC Blood Culture results may not be optimal due to an inadequate volume of blood received in culture bottles   Culture   Final    NO GROWTH 3 DAYS Performed at [facility], [address], [place], [place] [address]    Report Status PENDING  Incomplete  Respiratory (~20 pathogens) panel by PCR     Status: None   Collection Time: 11/09/23 10:22 AM   Specimen: Expectorated Sputum; Respiratory  Result Value Ref Range Status   Adenovirus NOT DETECTED NOT DETECTED Final   Coronavirus 229E NOT  DETECTED NOT DETECTED Final    Comment: (NOTE) The Coronavirus on the Respiratory Panel, DOES NOT test for the novel  Coronavirus (2019 nCoV)    Coronavirus HKU1 NOT DETECTED NOT DETECTED Final   Coronavirus NL63 NOT DETECTED NOT DETECTED Final   Coronavirus OC43 NOT DETECTED NOT DETECTED Final   Metapneumovirus NOT DETECTED NOT DETECTED Final   Rhinovirus / Enterovirus NOT DETECTED NOT DETECTED Final   Influenza A NOT DETECTED NOT DETECTED Final   Influenza B NOT DETECTED NOT DETECTED Final   Parainfluenza Virus 1 NOT DETECTED NOT DETECTED Final   Parainfluenza Virus 2 NOT DETECTED NOT DETECTED Final   Parainfluenza Virus 3 NOT DETECTED NOT DETECTED Final   Parainfluenza Virus 4 NOT DETECTED NOT DETECTED Final   Respiratory Syncytial Virus NOT DETECTED NOT DETECTED Final   Bordetella pertussis NOT DETECTED NOT DETECTED Final   Bordetella Parapertussis NOT DETECTED NOT DETECTED Final   Chlamydophila pneumoniae NOT DETECTED NOT DETECTED Final   Mycoplasma pneumoniae NOT DETECTED NOT DETECTED Final    Comment: Performed at [facility], [address], [place], [place] [address]  Expectorated Sputum Assessment w Gram Stain, Rflx to Resp Cult     Status: None   Collection Time: 11/09/23 10:38 AM   Specimen: Sputum  Result Value Ref Range Status   Specimen Description SPUTUM  Final   Special Requests Normal  Final   Sputum evaluation   Final    THIS SPECIMEN IS ACCEPTABLE FOR SPUTUM CULTURE Performed at [facility], [address] [place] [place], [place] [address]    Report Status 11/09/2023 FINAL  Final  Culture, Respiratory w Gram Stain     Status: None   Collection Time: 11/09/23 10:38 AM   Specimen: SPU  Result Value Ref Range Status   Specimen Description   Final    SPUTUM Performed at [facility], [address] [place], [place] [address]    Special Requests   Final    Normal Reflexed from [id number] Performed at [facility], [address] [place], [place] [address]    Gram Stain NO WBC SEEN RARE GRAM POSITIVE COCCI IN CHAINS   Final   Culture   Final    FEW Normal respiratory flora-no Staph aureus or Pseudomonas seen Performed at [facility], [address] [place], [place] [address]    Report Status 11/11/2023 FINAL  Final  MRSA Next Gen by PCR, Nasal     Status: None   Collection Time: 11/09/23 10:40 AM   Specimen: Expectorated Sputum; Nasal Swab  Result Value Ref Range Status   MRSA by PCR Next Gen NOT DETECTED NOT DETECTED Final    Comment: (NOTE) The GeneXpert MRSA Assay (FDA approved for NASAL specimens only), is one component of a comprehensive MRSA colonization surveillance program. It is not intended to diagnose MRSA infection nor to guide or monitor treatment for MRSA infections. Test performance is not FDA approved in patients less than 65 years old. Performed at [facility], [address] [place], [place] [address]       Radiology Studies last 3 days: ECHOCARDIOGRAM LIMITED Result Date: 11/11/2023    ECHOCARDIOGRAM LIMITED REPORT   Patient Name:   Paul Carpenter Date of Exam: 11/10/2023 Medical Rec #:  [id number]        Height:       72.0 in Accession #:    [id number]       Weight:       170.0 lb Date of Birth:  03/31/1956        BSA:          1.988 m Patient Age:    67 years         BP:           148/82 mmHg Patient Gender: M                HR:           94 bpm. Exam Location:  [facility] Procedure: Limited Echo and Intracardiac Opacification Agent Indications:     Cardiomyopathy  History:         Patient has prior history of Echocardiogram examinations, most                  recent 11/10/2022. Cardiomyopathy and CHF, CAD, COPD,                  Arrythmias:PVC, Signs/Symptoms:Shortness of Breath; Risk                  Factors:Hypertension, Current Smoker, Dyslipidemia and Sleep                  Apnea. Lung CA.  Sonographer:     Naomie Reef Referring Phys:  [id number] CARMEN L [name] Diagnosing  Phys: Marsa Dooms MD  Sonographer Comments: Technically difficult study due to poor echo windows. Image acquisition challenging due to COPD and Image acquisition challenging due to respiratory motion. IMPRESSIONS  1. Left ventricular ejection fraction, by estimation, is 45 to 50%. The left ventricle has mildly decreased function. The left ventricle has no regional wall motion abnormalities. Left ventricular diastolic parameters are indeterminate.  2. Right ventricular systolic function is normal. The right ventricular size is normal.  3. The mitral valve is normal in structure. Mild mitral valve regurgitation. No evidence of mitral stenosis.  4. The aortic valve is normal in structure. Aortic valve regurgitation is not visualized. No aortic stenosis is present.  5. The inferior vena cava is normal in size with greater than 50% respiratory variability, suggesting right atrial pressure of 3 mmHg. FINDINGS  Left Ventricle: Left ventricular ejection fraction, by estimation, is 45 to 50%. The left ventricle has mildly decreased function. The left ventricle has no regional wall motion abnormalities. Definity  contrast agent was given IV to delineate the left ventricular endocardial borders. The left ventricular internal cavity size was normal in size. There is no left ventricular hypertrophy. Left ventricular diastolic parameters are indeterminate. Right Ventricle: The right ventricular size is normal. No increase in right ventricular wall thickness. Right ventricular systolic function is normal. Left Atrium: Left atrial size was normal in size. Right Atrium: Right atrial size was normal in size. Pericardium: There is no evidence of pericardial effusion. Mitral Valve: The mitral valve is normal in structure. Mild mitral valve regurgitation. No evidence of mitral valve stenosis. Tricuspid Valve: The tricuspid valve is normal in structure. Tricuspid valve regurgitation is trivial. No evidence of tricuspid stenosis.  Aortic Valve: The aortic valve is normal in structure. Aortic valve regurgitation is not visualized. No aortic stenosis is present. Pulmonic Valve: The pulmonic valve was normal in structure. Pulmonic valve regurgitation is not visualized. No evidence of pulmonic stenosis. Aorta: The aortic root is normal in size and structure. Venous: The inferior vena cava is normal in size with greater than 50% respiratory variability, suggesting right atrial pressure of 3 mmHg. IAS/Shunts: No atrial level shunt detected by color flow Doppler. LEFT VENTRICLE PLAX 2D LVIDd:         4.70 cm LVIDs:         3.50 cm LV PW:         1.20 cm LV IVS:        1.20 cm LVOT diam:     2.20 cm LVOT Area:     3.80 cm  LV Volumes (MOD) LV vol d, MOD A2C: 57.3 ml LV vol d, MOD A4C: 69.5 ml LV vol s, MOD A2C: 35.5 ml LV vol s, MOD A4C: 38.1 ml LV SV MOD A2C:     21.8 ml LV SV MOD A4C:     69.5 ml LV SV MOD BP:      27.5 ml RIGHT VENTRICLE RV Basal diam:  3.80 cm LEFT ATRIUM         Index LA diam:    3.40 cm 1.71 cm/m   AORTA Ao Root diam: 4.00 cm  SHUNTS Systemic Diam: 2.20 cm Marsa Dooms MD Electronically signed by Marsa Dooms MD Signature Date/Time: 11/11/2023/11:41:38 AM    Final    CT Angio Chest PE W and/or Wo Contrast Result Date: 11/08/2023 CLINICAL DATA:  Shortness of breath EXAM: CT ANGIOGRAPHY CHEST WITH CONTRAST TECHNIQUE: Multidetector CT imaging of the chest was performed using the standard protocol during bolus administration of intravenous contrast. Multiplanar  CT image reconstructions and MIPs were obtained to evaluate the vascular anatomy. RADIATION DOSE REDUCTION: This exam was performed according to the departmental dose-optimization program which includes automated exposure control, adjustment of the mA and/or kV according to patient size and/or use of iterative reconstruction technique. CONTRAST:  80mL OMNIPAQUE  IOHEXOL  350 MG/ML SOLN COMPARISON:  Chest x-ray 11/08/2023, CT chest 08/10/2023, 07/08/2023,  05/13/2023 and multiple prior exams FINDINGS: Cardiovascular: Satisfactory opacification of the pulmonary arteries to the segmental level. No evidence of pulmonary embolism. Nonaneurysmal aorta. Moderate atherosclerosis. Coronary vascular calcification. Normal cardiac size. Small pericardial effusion Mediastinum/Nodes: Patent trachea. No suspicious thyroid  mass. Calcified mediastinal lymph nodes consistent with prior granulomatous disease. Similar mild mediastinal lymph nodes. Esophagus within normal limits. Lungs/Pleura: Large thick-walled cavitary mass at the right apex with internal debris, measures about 8.6 x 6.9 by 9.4 cm on series 5, image 38 and coronal series 7, image 104. Cavitary mass directly communicates with right upper lobe airway, series 5 image 52 through 56. Advanced emphysema. Multiple solid bilateral pulmonary nodules are again noted. Grossly stable distribution when compared with prior exam. Index irregular right lung base pulmonary nodule on series 5, image 124 measures 12 x 10 mm previously 12 x 8 mm. Index left lower lobe pulmonary nodule on series 5, image 82 measures 10 x 10 mm previously 11 x 10 mm. Upper Abdomen: No acute finding Musculoskeletal: Similar chronic irregularity of right first and second ribs. No acute osseous abnormality. Review of the MIP images confirms the above findings. IMPRESSION: 1. Negative for acute pulmonary embolus. 2. Large thick-walled cavitary mass at the right apex with internal debris, grossly stable. Cavitary mass directly communicates with right upper lobe airway as seen on prior exam. 3. Multiple solid bilateral pulmonary nodules, grossly stable distribution when compared with prior exam. Measured index nodules are grossly stable. 4. Advanced emphysema. 5. Aortic atherosclerosis. Aortic Atherosclerosis (ICD10-I70.0) and Emphysema (ICD10-J43.9). Electronically Signed   By: Luke Bun M.D.   On: 11/08/2023 20:17   DG Chest Portable 1 View Result Date:  11/08/2023 CLINICAL DATA:  Shortness of breath. EXAM: PORTABLE CHEST 1 VIEW COMPARISON:  February 09, 2023. FINDINGS: The heart size and mediastinal contours are within normal limits. Right internal jugular Port-A-Cath is unchanged. Stable cavitary lesion is noted in right lung apex concerning for malignancy. Small pulmonary nodules may be present in both lungs as noted on prior exams. The visualized skeletal structures are unremarkable. IMPRESSION: Stable cavitary lesion seen in right lung apex concerning for malignancy. Small pulmonary nodules may be present bilaterally as described on prior studies. Electronically Signed   By: Lynwood Landy Raddle M.D.   On: 11/08/2023 17:36       Time spent: 50 min     Mora Pedraza, DO [facility] 11/11/2023, 1:29 PM    Dictation software may have been used to generate the above note. Typos may occur and escape review in typed/dictated notes. Please contact Dr Marsa directly for clarity if needed.  Staff may message me via secure chat in Epic  but this may not receive an immediate response,  please page me for urgent matters!  If 7PM-7AM, please contact night coverage [email or web address]

## 2023-11-11 NOTE — Discharge Instructions (Signed)
 Your nurse navigator, Cleotilde Spadaccini, can be reached at [phone]

## 2023-11-11 NOTE — Progress Notes (Signed)
 Introduced patient to role of statistician. Intake questions completed. Patient denies any SDOH needs at present time. Patient expressing wanting to go home. Patient currently on 2L of oxygen. Patient endorses having home O2 through [employer] on a PRN basis, although he has had to use it more frequently lately.  Patient lives with his wife of almost 50 years. They have 3 children and 10 grandchildren. He drives short distances, for longer distances his wife drives. Patient active with PCP and several specialists.

## 2023-11-11 NOTE — Care Management Important Message (Signed)
 Important Message  Patient Details  Name: Paul Carpenter MRN: [id number] Date of Birth: 11-May-1956   Important Message Given:  Yes - Medicare IM     Cristela Blue, CMA 11/11/2023, 9:59 AM

## 2023-11-11 NOTE — Progress Notes (Signed)
 Daily Progress Note   Patient Name: Paul Carpenter       Date: 11/11/2023 DOB: 11/30/1955  Age: 68 y.o. MRN#: [id number] Attending Physician: Marsa Edelman, DO Primary Care Physician: Sadie Manna, MD Admit Date: 11/08/2023  Reason for Consultation/Follow-up: Establishing goals of care  HPI/Brief Hospital Review: 68 y.o. 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    Patient is being followed by ID.   Patient been treated for COPD exacerbation, resistant MAC with possible superimposed infection, and chronic respiratory failure with hypoxia.   PMT was consulted to discuss GOC.   Subjective: Extensive chart review has been completed prior to meeting patient including labs, vital signs, imaging, progress notes, orders, and available advanced directive documents from current and previous encounters.    Visited with Ms. Moilanen at his bedside. He is awake, alert, sitting up in recliner and able to engage in conversation. He reports feeling better today without acute complaints.  Mr. Rousseau is able to recall previous conversations had with PMT yesterday. He shares he is aware of the severity of his condition. He shares his appreciation in discussing options such as palliative care and hospice care. Mr. Lightcap would like for his wife and family to be involved in conversations.  Returned to room when family visiting, wife, son, daughter and granddaughter visiting at bedside.  We discussed the role of Palliative care being specialized medical care for people  living with serious illness, focusing on providing relief from symptoms and stress of serious illness. Overall goal to improve quality of life.  We discussed the difference between palliative care and hospice care, we also discussed the difference levels of hospice care-home with hospice versus IPU.  We also discussed the role of outpatient palliative care, wife shares he has already been seen by outpatient palliative through [facility].  Mr. Olivero also briefly discussed code status and shared his understanding of the difference between Full Code versus Do Not Resuscitate.  Mr. Kamath shares at this time he wishes to continue with current treatment plan but is aware of the severity of his illness and recognizes the importance  of having goals of care conversations with his medical team as well as his family.  Answered and addressed all questions and concerns. PMT to continue to follow for ongoing needs and support.  Objective:  Physical Exam Constitutional:      General: He is not in acute distress.    Appearance: He is ill-appearing.  Pulmonary:     Effort: Pulmonary effort is normal. No respiratory distress.  Skin:    General: Skin is warm and dry.  Neurological:     Mental Status: He is alert and oriented to person, place, and time.             Vital Signs: BP 133/81 (BP Location: Left Arm)   Pulse 79   Temp 98.1 F (36.7 C) (Oral)   Resp 16   Ht 6' (1.829 m)   Wt 77.1 kg   SpO2 97%   BMI 23.06 kg/m  SpO2: SpO2: 97 % O2 Device: O2 Device: Nasal Cannula O2 Flow Rate: O2 Flow Rate (L/min): 2 L/min   Palliative Care Assessment & Plan   Assessment/Recommendation/Plan  Full Code-continue current plan of care Ongoing support from PMT and GOC discussions  Care plan was discussed with nursing staff and pulmonary team.  Thank you for allowing the Palliative Medicine Team to assist in the care of this patient.  Total time:  50 minutes  Time spent includes: Detailed  review of medical records (labs, imaging, vital signs), medically appropriate exam (mental status, respiratory, cardiac, skin), discussed with treatment team, counseling and educating patient, family and staff, documenting clinical information, medication management and coordination of care.  Waddell Lesches, DNP, AGNP-C Palliative Medicine   Please contact Palliative Medicine Team phone at [phone] for questions and concerns.

## 2023-11-11 NOTE — TOC Initial Note (Signed)
 Transition of Care [facility]) - Initial/Assessment Note    Patient Details  Name: Paul Carpenter MRN: [id number] Date of Birth: 1956/05/28  Transition of Care [facility]) CM/SW Contact:    Ladene Lady, LCSW Phone Number: 11/11/2023, 2:49 PM  Clinical Narrative:   Pt admitted from home with wife. Pt being followed by ID. Per pam, patient was on IV antibiotics with them prior to admission. Pt is currently getting oxygen through [facility]. CSW will continue to follow for post discharge antibiotic needs.                Expected Discharge Plan: Home/Self Care Barriers to Discharge: Continued Medical Work up   Patient Goals and CMS Choice Patient states their goals for this hospitalization and ongoing recovery are:: Return home   Choice offered to / list presented to : Patient      Expected Discharge Plan and Services                           DME Agency: [employer]                  Prior Living Arrangements/Services              Need for Family Participation in Patient Care: Yes (Comment)   Current home services: DME    Activities of Daily Living   ADL Screening (condition at time of admission) Independently performs ADLs?: Yes (appropriate for developmental age) Is the patient deaf or have difficulty hearing?: No Does the patient have difficulty seeing, even when wearing glasses/contacts?: No Does the patient have difficulty concentrating, remembering, or making decisions?: No  Permission Sought/Granted                  Emotional Assessment       Orientation: : Oriented to Self, Oriented to Place, Oriented to  Time, Oriented to Situation      Admission diagnosis:  COPD exacerbation (HCC) [J44.1] COPD with acute exacerbation (HCC) [J44.1] Acute on chronic hypoxic respiratory failure (HCC) [J96.21] Patient Active Problem List   Diagnosis Date Noted   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   Chronic adrenal insufficiency (HCC)  11/09/2023   Acute on chronic hypoxic respiratory failure (HCC) 11/09/2023   COPD with acute exacerbation (HCC) 11/08/2023   Pulmonary cavitary lesion 11/08/2023   Chronic respiratory failure with hypoxia (HCC) 11/08/2023   Hyponatremia 09/11/2022   Generalized weakness 09/10/2022   Shortness of breath 02/03/2022   SIADH (syndrome of inappropriate ADH production) (HCC) 09/22/2021   Overweight (BMI 25.0-29.9) 09/22/2021   Hypomagnesemia 09/21/2021   Elevated hemoglobin A1c 06/19/2020   Hypothyroidism, acquired 05/30/2019   Squamous cell lung cancer, right (HCC) 05/30/2019   HFrEF (heart failure with reduced ejection fraction) (HCC) 03/06/2019   Atherosclerosis 12/14/2018   Non-small cell lung cancer, right (HCC) 04/24/2018   Oral ulcer 02/16/2018   Pituitary disorder (HCC) 02/16/2018   Migraine headache 03/07/2017   HCAP (healthcare-associated pneumonia) 09/11/2016   COPD exacerbation (HCC) 09/11/2016   Chronic hyponatremia 09/11/2016   Leukocytosis 09/11/2016   Thrombocytopenia (HCC) 09/11/2016   Cigarette smoker 06/11/2016   Cervical radiculopathy 04/15/2016   Cervical disc disorder at C5-C6 level with radiculopathy 03/09/2016   Impingement syndrome of right shoulder 03/09/2016   Health care maintenance 09/29/2015   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   Benign essential hypertension 05/28/2015   Polycythemia 03/24/2015   Carotid artery disease (HCC) 12/12/2014   Disequilibrium 12/12/2014   Mixed hyperlipidemia 12/10/2014  Incomplete emptying of bladder 06/04/2014   Anxiety 05/18/2014   Chronic coronary artery disease 05/18/2014   Chronic headaches 05/18/2014   Acute shoulder pain 03/15/2014   Impingement syndrome of left shoulder 03/15/2014   Lung mass 12/06/2013   Kidney stone 11/24/2013   COPD (chronic obstructive pulmonary disease) (HCC) 04/24/2013   Tobacco use disorder 04/24/2013   Obstructive sleep apnea 04/24/2013   Benign localized prostatic hyperplasia with lower  urinary tract symptoms (LUTS) 07/04/2012   Encounter for long-term current use of medication 07/04/2012   ED (erectile dysfunction) of organic origin 07/04/2012   Testicular hypofunction 07/04/2012   PCP:  Sadie Manna, MD Pharmacy:   [facility] [phone] - [place],  - [address] [place] [address] Phone: [phone] Fax: [phone]  [facility] [address], [place] - [address] [place] [address] Phone: [phone] Fax: [phone]     Social Drivers of Health (SDOH) Social History: SDOH Screenings   Food Insecurity: No Food Insecurity (11/09/2023)  Housing: Low Risk  (11/09/2023)  Transportation Needs: No Transportation Needs (11/09/2023)  Utilities: Not At Risk (11/09/2023)  Alcohol Screen: Medium Risk (07/22/2022)  Depression (PHQ2-9): Low Risk  (03/10/2023)  Financial Resource Strain: Low Risk  (10/24/2023)   Received from [facility]  Physical Activity: Inactive (10/04/2023)   [facility]  Social Connections: Socially Integrated (11/09/2023)  Stress: No Stress Concern Present (10/04/2023)   Received from [facility]  Tobacco Use: High Risk (11/08/2023)  Health Literacy: Medium Risk (10/04/2023)   Received from [facility]   SDOH Interventions:     Readmission Risk Interventions     No data to display

## 2023-11-11 NOTE — Progress Notes (Signed)
 Date of Admission:  11/08/2023      ID: Paul Carpenter is a 68 y.o. male  Principal Problem:   COPD with acute exacerbation (HCC) Active Problems:   Tobacco use disorder   Obstructive sleep apnea   Benign essential hypertension   Carotid artery disease (HCC)   Frequent PVCs   Non-small cell lung cancer, right (HCC)   Hypothyroidism, acquired   SIADH (syndrome of inappropriate ADH production) (HCC)   Hyponatremia   Pulmonary cavitary lesion   Chronic respiratory failure with hypoxia (HCC)   Chronic adrenal insufficiency (HCC)   Acute on chronic hypoxic respiratory failure (HCC)   Mycobacterium avium-intracellulare infection (HCC)    Subjective:  Pt feeling better   Medications:   arformoterol   15 mcg Nebulization BID   atorvastatin   10 mg Oral Daily   azithromycin   500 mg Oral q1800   budesonide  (PULMICORT ) nebulizer solution  0.5 mg Nebulization BID   clofazimine   100 mg Oral Q breakfast   enoxaparin  (LOVENOX ) injection  40 mg Subcutaneous Q24H   ethambutol   1,200 mg Oral Daily   folic acid   1 mg Oral Daily   guaiFENesin   600 mg Oral BID   levothyroxine   175 mcg Oral Q0600   magnesium  oxide  600 mg Oral BID   methylPREDNISolone  (SOLU-MEDROL ) injection  40 mg Intravenous Q12H   nicotine   21 mg Transdermal Daily   pantoprazole   40 mg Oral Daily   revefenacin   175 mcg Nebulization Daily    Objective: Vital signs in last 24 hours: Patient Vitals for the past 24 hrs:  BP Temp Temp src Pulse Resp SpO2  11/11/23 0744 (!) 143/83 97.9 F (36.6 C) Oral 80 16 99 %  11/11/23 0418 122/66 97.8 F (36.6 C) Oral 78 18 93 %  11/10/23 2033 -- -- -- -- -- 98 %  11/10/23 2004 (!) 142/84 97.9 F (36.6 C) -- 87 18 100 %  11/10/23 1600 131/86 97.7 F (36.5 C) Oral 93 18 98 %  11/10/23 1204 135/88 98.6 F (37 C) Oral 99 20 98 %     LDA port  PHYSICAL EXAM:  General: Alert, cooperative, no distress, appears stated age.  Lungs: b/l rhonchi. Heart: Regular rate and  rhythm, no murmur, rub or gallop. Abdomen: Soft, non-tender,not distended. Bowel sounds normal. No masses Extremities: atraumatic, no cyanosis. No edema. No clubbing Skin: reddish hue Lymph: Cervical, supraclavicular normal. Neurologic: Grossly non-focal  Lab Results    Latest Ref Rng & Units 11/09/2023    5:55 AM 11/08/2023    5:10 PM 09/27/2023   11:30 AM  CBC  WBC 4.0 - 10.5 K/uL 7.5  9.7  9.7   Hemoglobin 13.0 - 17.0 g/dL 86.6  85.6  86.4   Hematocrit 39.0 - 52.0 % 38.9  41.3  38.8   Platelets 150 - 400 K/uL 204  213  247        Latest Ref Rng & Units 11/11/2023    6:05 AM 11/10/2023    4:05 AM 11/09/2023    5:55 AM  CMP  Glucose 70 - 99 mg/dL 890  871  887   BUN 8 - 23 mg/dL 14  12  11    Creatinine 0.61 - 1.24 mg/dL 9.21  9.29  9.33   Sodium 135 - 145 mmol/L 135  135  135   Potassium 3.5 - 5.1 mmol/L 4.2  3.9  4.2   Chloride 98 - 111 mmol/L 92  95  96  CO2 22 - 32 mmol/L 34  29  29   Calcium  8.9 - 10.3 mg/dL 8.5  8.5  8.7       Microbiology: BC- NG Sputum culture ng  Studies/Results: CT chest Cavitary right upper lobe Emphysema    Assessment/Plan: COPD exacerbation-r/o superadded bacterial infeciton Sputum culture sent on nebulizer, IV solumedrol On IV cefepime  If sputum culture neg will DC on monday   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 He will follow up with him  Pinkish red hue of skin due to clofazimine    Stage IIIa carcinoma of the lung s/p radiation, chemo and immune therapy- none now'     Adrenal insufficiency -   Hypothyroidism on synthroid    SIADH   Anemia   Current smoker    Discussed the management with the patient and pulmonologist

## 2023-11-12 DIAGNOSIS — C3491 Malignant neoplasm of unspecified part of right bronchus or lung: Secondary | ICD-10-CM | POA: Diagnosis not present

## 2023-11-12 DIAGNOSIS — J984 Other disorders of lung: Secondary | ICD-10-CM | POA: Diagnosis not present

## 2023-11-12 DIAGNOSIS — J441 Chronic obstructive pulmonary disease with (acute) exacerbation: Secondary | ICD-10-CM | POA: Diagnosis not present

## 2023-11-12 DIAGNOSIS — A310 Pulmonary mycobacterial infection: Secondary | ICD-10-CM | POA: Diagnosis not present

## 2023-11-12 DIAGNOSIS — J9621 Acute and chronic respiratory failure with hypoxia: Secondary | ICD-10-CM | POA: Diagnosis not present

## 2023-11-12 DIAGNOSIS — Z515 Encounter for palliative care: Secondary | ICD-10-CM | POA: Diagnosis not present

## 2023-11-12 LAB — CBC
HCT: 39.1 % (ref 39.0–52.0)
Hemoglobin: 13.2 g/dL (ref 13.0–17.0)
MCH: 31.5 pg (ref 26.0–34.0)
MCHC: 33.8 g/dL (ref 30.0–36.0)
MCV: 93.3 fL (ref 80.0–100.0)
Platelets: 180 10*3/uL (ref 150–400)
RBC: 4.19 MIL/uL — ABNORMAL LOW (ref 4.22–5.81)
RDW: 12.1 % (ref 11.5–15.5)
WBC: 8.7 10*3/uL (ref 4.0–10.5)
nRBC: 0 % (ref 0.0–0.2)

## 2023-11-12 LAB — BASIC METABOLIC PANEL
Anion gap: 5 (ref 5–15)
BUN: 13 mg/dL (ref 8–23)
CO2: 34 mmol/L — ABNORMAL HIGH (ref 22–32)
Calcium: 8.3 mg/dL — ABNORMAL LOW (ref 8.9–10.3)
Chloride: 94 mmol/L — ABNORMAL LOW (ref 98–111)
Creatinine, Ser: 0.73 mg/dL (ref 0.61–1.24)
GFR, Estimated: >60 mL/min (ref >60)
Glucose, Bld: 125 mg/dL — ABNORMAL HIGH (ref 70–99)
Potassium: 4.3 mmol/L (ref 3.5–5.1)
Sodium: 133 mmol/L — ABNORMAL LOW (ref 135–145)

## 2023-11-12 MED ORDER — GUAIFENESIN-DM 100-10 MG/5ML PO SYRP
5.0000 mL | ORAL_SOLUTION | ORAL | Status: DC | PRN
  Administered 2023-11-12 – 2023-11-13 (×3): 5 mL via ORAL
  Filled 2023-11-12 (×3): qty 10

## 2023-11-12 NOTE — Progress Notes (Signed)
 Mobility Specialist - Progress Note   Pre-mobility: SpO2(95) During mobility: SpO2(93) Post-mobility:SPO2(94)     11/12/23 1432  Mobility  Activity Ambulated independently in hallway;Stood at bedside;Dangled on edge of bed  Level of Assistance Independent  Assistive Device None  Distance Ambulated (ft) 160 ft  Range of Motion/Exercises Active  Activity Response Tolerated well  Mobility Referral Yes  Mobility visit 1 Mobility  Mobility Specialist Start Time (ACUTE ONLY) 1414  Mobility Specialist Stop Time (ACUTE ONLY) 1432  Mobility Specialist Time Calculation (min) (ACUTE ONLY) 18 min   Pt resting in bed on RA upon entry. Pt had persistent cough throughout session. Pt STS and ambulates to hallway around NS for 1 lap Indep with no AD. Pt O2 remained above 90> (94-95%). Pt endorses no SOB (audible wheeze present) or LOB during amb. Pt returned to bed and left with needs in reach. Pt LPN and family present at bedside.   Guido Rumble Mobility Specialist 11/12/23, 2:43 PM

## 2023-11-12 NOTE — Plan of Care (Signed)
  Problem: Clinical Measurements: Goal: Respiratory complications will improve Outcome: Progressing Goal: Cardiovascular complication will be avoided Outcome: Progressing   Problem: Activity: Goal: Risk for activity intolerance will decrease Outcome: Progressing   Problem: Coping: Goal: Level of anxiety will decrease Outcome: Progressing   Problem: Elimination: Goal: Will not experience complications related to bowel motility Outcome: Progressing Goal: Will not experience complications related to urinary retention Outcome: Progressing   Problem: Pain Management: Goal: General experience of comfort will improve Outcome: Progressing   Problem: Safety: Goal: Ability to remain free from injury will improve Outcome: Progressing   Problem: Skin Integrity: Goal: Risk for impaired skin integrity will decrease Outcome: Progressing   Problem: Education: Goal: Knowledge of disease or condition will improve Outcome: Progressing Goal: Knowledge of the prescribed therapeutic regimen will improve Outcome: Progressing   Problem: Activity: Goal: Ability to tolerate increased activity will improve Outcome: Progressing   Problem: Respiratory: Goal: Ability to maintain a clear airway will improve Outcome: Progressing Goal: Levels of oxygenation will improve Outcome: Progressing Goal: Ability to maintain adequate ventilation will improve Outcome: Progressing

## 2023-11-12 NOTE — Progress Notes (Addendum)
 PROGRESS NOTE    Paul Carpenter   FMW:[id number] DOB: 1956-01-30  DOA: 11/08/2023 Date of Service: 11/12/23 which is hospital day 4  PCP: Sadie Manna, MD    Hospital course / significant events:   HPI: Paul Carpenter is a 68 y.o. 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 Tamea for IV antibiotics and steroids after failing outpatient management of her COPD exacerbation.  Patient has had progressive shortness of breath for the last 4 to 5 weeks worse over the last 2 to 3 days associated with cough productive of green sputum and occasional hemoptysis.  Has been on prednisone  and amikacin  without much improvement.  Uses O2 at 2 L with O2 sats at home have been 82 to 85% on home flow rate.    01/07: admitted to hospitalist service.CTA chest PE protocol negative for PE, showing known cavitary mass, showing stable pulmonary nodules that are related to his MAC which do show waxing and waning pattern of size change but overall the nodules are stable, CT also of course shows advanced emphysema. Got DuoNebs and Solu-Medrol  and started on cefepime  and vancomycin .  01/08: per ID restart amikacin  and ethambutol  and continue azithromycin  and clofazamine. Per Pulm appears to be some improved with regards to work of breathing but continues to have significant cough and dyspnea, ordered Echo, tussionex, albuterol  nebs, revefenacin  nebs, pulmicort  nebs, aformoterol nebs 01/09-01/10: continue pulmonary treatments and abx, final cultures and fungitell pending. Resp culture rare GPC chains. Echo EF 45-50%. Per ID anticipate dc Mon once completed cefepime  if cx remain neg  01/11: normal resp flora on resp culture. Fungitell still pending.      Consultants:  Pulmonology Infectious Disease  Palliative  Care   Procedures/Surgeries: none      ASSESSMENT & PLAN:   COPD with acute exacerbation  Resistant MAC with possible superimposed infection Chronic respiratory failure with hypoxia Patient failing amikacin  and prednisone  outpatient Per pulmonary, he has been in progressive decline for over a year.  Despite his many comorbidities he continues to smoke 1 to 2 packs of cigarettes per day.  No concerns on Echo Per ID - Tx MAC amikacin , ethambutol , azithromycin  and clofazamine. Also on IV cefepime  plan to complete per ID likely Monday and anticipate discharge then if no concerns on culture Per Pulm - albuterol  nebs prn, revefenacin  nebs, pulmicort  nebs bid, aformoterol nebs Continue steroids SoluMedrol 40 mg IV q12h  Guaifenesin  scheduled, tussionex prn  Resp culture - likely strep, normal flora, no staph or pseudomonas  fungitell pending    Non-small cell lung cancer, right Cavitary mass/stable pulmonary nodules No acute issues suspected Follow-up with oncology Pt requested port-a-cath de access    SIADH (syndrome of inappropriate ADH production) (HCC) Hyponatremia Stable Monitor BMP daily Normal sodium, holding home salt tabs for now   Anxiety/insomnia Chronic benzodiazepine use Home valium  ok for prn at bedtime but concern given higher CO2 on AM labs, if respiratory insufficiency at night is a concern may need d/c benzos and/or higher O2 at bedtime    Chronic adrenal insufficiency Home steroids on hold while getting high-dose steroids here   Benign essential hypertension Continue losartan and metoprolol    Hypothyroidism, acquired Continue levothyroxine    Frequent PVCs Continue metoprolol    Tobacco use disorder Nicotine  patch  DVT prophylaxis: lovenox   IV fluids: no continuous IV fluids  Nutrition: regular diet Central lines / invasive devices: has port in place  Code Status: FULL CODE ACP documentation reviewed:  none on file in [facility]  TOC  needs: none at this time Barriers to dispo / significant pending items: await echo, clinical improvement, ID/Pulm clerance prior to dc              Subjective / Brief ROS:  Patient reports cough about same, not getting much out but feels like there is something there to cough up.  Requests to walk around more, Denies CP/SOB at rest Pain controlled.  Tolerating diet.   Family Communication: none at this time     Objective Findings:  Vitals:   11/12/23 0447 11/12/23 0732 11/12/23 1135 11/12/23 1640  BP: 122/63  129/78 136/67  Pulse: 65  82 75  Resp: 18  18 (!) 24  Temp: 97.6 F (36.4 C)  97.7 F (36.5 C) 98 F (36.7 C)  TempSrc:      SpO2: 91% 99% 97% 95%  Weight:      Height:        Intake/Output Summary (Last 24 hours) at 11/12/2023 1642 Last data filed at 11/12/2023 1134 Gross per 24 hour  Intake --  Output 1125 ml  Net -1125 ml   Filed Weights   11/08/23 1654  Weight: 77.1 kg    Examination:  Physical Exam Constitutional:      General: He is not in acute distress. Cardiovascular:     Rate and Rhythm: Normal rate and regular rhythm.  Pulmonary:     Effort: Pulmonary effort is normal. No respiratory distress.     Breath sounds: Decreased breath sounds present.  Musculoskeletal:     Right lower leg: No edema.     Left lower leg: No edema.  Neurological:     General: No focal deficit present.     Mental Status: He is alert and oriented to person, place, and time.  Psychiatric:        Mood and Affect: Mood normal.        Behavior: Behavior normal.          Scheduled Medications:   arformoterol   15 mcg Nebulization BID   atorvastatin   10 mg Oral Daily   azithromycin   500 mg Oral q1800   budesonide  (PULMICORT ) nebulizer solution  0.5 mg Nebulization BID   clofazimine   100 mg Oral Q breakfast   enoxaparin  (LOVENOX ) injection  40 mg Subcutaneous Q24H   ethambutol   1,200 mg Oral Daily   folic acid   1 mg Oral Daily   guaiFENesin   600 mg  Oral BID   levothyroxine   175 mcg Oral Q0600   magnesium  oxide  600 mg Oral BID   methylPREDNISolone  (SOLU-MEDROL ) injection  40 mg Intravenous Q12H   nicotine   21 mg Transdermal Daily   pantoprazole   40 mg Oral Daily   revefenacin   175 mcg Nebulization Daily    Continuous Infusions:  amikacin  (AMIKIN ) 750 mg in dextrose  5 % 100 mL IVPB 750 mg (11/12/23 0924)   ceFEPime  (MAXIPIME ) IV 2 g (11/12/23 1449)    PRN Medications:  acetaminophen  **OR** acetaminophen , albuterol , chlorpheniramine-HYDROcodone , diazepam , HYDROcodone -acetaminophen , melatonin, metoprolol  succinate, ondansetron  **OR** ondansetron  (ZOFRAN ) IV  Antimicrobials from admission:  Anti-infectives (From admission, onward)    Start     Dose/Rate Route Frequency Ordered Stop   11/10/23 1800  azithromycin  (ZITHROMAX ) tablet 500 mg  500 mg Oral Daily-1800 11/10/23 1435     11/10/23 1000  ethambutol  (MYAMBUTOL ) tablet 1,200 mg        1,200 mg Oral Daily 11/09/23 1900     11/10/23 0800  clofazimine  50 mg capsule (for compassionate use)  Status:  Discontinued        100 mg Oral Daily with breakfast 11/09/23 0904 11/09/23 1237   11/10/23 0800  amikacin  (AMIKIN ) 750 mg in dextrose  5 % 100 mL IVPB        750 mg 103 mL/hr over 60 Minutes Intravenous Every 24 hours 11/09/23 1900     11/09/23 1245  clofazimine  50 mg capsule (for compassionate use)        100 mg Oral Daily with breakfast 11/09/23 1237     11/09/23 1000  ethambutol  (MYAMBUTOL ) tablet 1,200 mg  Status:  Discontinued        1,200 mg Oral Daily 11/09/23 0904 11/09/23 0914   11/09/23 0900  vancomycin  (VANCOREADY) IVPB 1250 mg/250 mL  Status:  Discontinued        1,250 mg 166.7 mL/hr over 90 Minutes Intravenous Every 12 hours 11/08/23 2351 11/09/23 1857   11/09/23 0300  ceFEPIme  (MAXIPIME ) 2 g in sodium chloride  0.9 % 100 mL IVPB        2 g 200 mL/hr over 30 Minutes Intravenous Every 8 hours 11/08/23 2330 11/13/23 2159   11/08/23 2345  azithromycin  (ZITHROMAX )  500 mg in sodium chloride  0.9 % 250 mL IVPB  Status:  Discontinued        500 mg 250 mL/hr over 60 Minutes Intravenous Every 24 hours 11/08/23 2330 11/10/23 1435   11/08/23 1900  ceFEPIme  (MAXIPIME ) 2 g in sodium chloride  0.9 % 100 mL IVPB        2 g 200 mL/hr over 30 Minutes Intravenous  Once 11/08/23 1834 11/08/23 2036   11/08/23 1900  vancomycin  (VANCOREADY) IVPB 1750 mg/350 mL        1,750 mg 175 mL/hr over 120 Minutes Intravenous  Once 11/08/23 1834 11/08/23 2325           Data Reviewed:  I have personally reviewed the following...  CBC: Recent Labs  Lab 11/08/23 1710 11/09/23 0555 11/12/23 0557  WBC 9.7 7.5 8.7  NEUTROABS 9.2*  --   --   HGB 14.3 13.3 13.2  HCT 41.3 38.9* 39.1  MCV 92.0 91.1 93.3  PLT 213 204 180   Basic Metabolic Panel: Recent Labs  Lab 11/08/23 1710 11/09/23 0555 11/10/23 0405 11/11/23 0605 11/12/23 0557  NA 131* 135 135 135 133*  K 3.9 4.2 3.9 4.2 4.3  CL 91* 96* 95* 92* 94*  CO2 32 29 29 34* 34*  GLUCOSE 103* 112* 128* 109* 125*  BUN 12 11 12 14 13   CREATININE 0.76 0.66 0.70 0.78 0.73  CALCIUM  8.6* 8.7* 8.5* 8.5* 8.3*   GFR: Estimated Creatinine Clearance: 99.1 mL/min (by C-G formula based on SCr of 0.73 mg/dL). Liver Function Tests: Recent Labs  Lab 11/08/23 1710  AST 19  ALT 17  ALKPHOS 77  BILITOT 0.6  PROT 6.3*  ALBUMIN 3.8   No results for input(s): LIPASE, AMYLASE in the last 168 hours. No results for input(s): AMMONIA in the last 168 hours. Coagulation Profile: No results for input(s): INR, PROTIME in the last 168 hours. Cardiac Enzymes: No results for input(s): CKTOTAL, CKMB, CKMBINDEX, TROPONINI in the last 168 hours. BNP (last 3 results) No results for input(s): PROBNP in the last 8760  hours. HbA1C: No results for input(s): HGBA1C in the last 72 hours. CBG: No results for input(s): GLUCAP in the last 168 hours. Lipid Profile: No results for input(s): CHOL, HDL, LDLCALC,  TRIG, CHOLHDL, LDLDIRECT in the last 72 hours. Thyroid  Function Tests: No results for input(s): TSH, T4TOTAL, FREET4, T3FREE, THYROIDAB in the last 72 hours. Anemia Panel: No results for input(s): VITAMINB12, FOLATE, FERRITIN, TIBC, IRON , RETICCTPCT in the last 72 hours. Most Recent Urinalysis On File:     Component Value Date/Time   COLORURINE YELLOW (A) 02/09/2023 2128   APPEARANCEUR CLEAR (A) 02/09/2023 2128   APPEARANCEUR Clear 08/15/2020 0951   LABSPEC 1.013 02/09/2023 2128   LABSPEC 1.004 01/26/2012 0944   PHURINE 6.0 02/09/2023 2128   GLUCOSEU NEGATIVE 02/09/2023 2128   GLUCOSEU Negative 01/26/2012 0944   HGBUR NEGATIVE 02/09/2023 2128   BILIRUBINUR NEGATIVE 02/09/2023 2128   BILIRUBINUR Negative 08/15/2020 0951   BILIRUBINUR Negative 01/26/2012 0944   KETONESUR NEGATIVE 02/09/2023 2128   PROTEINUR NEGATIVE 02/09/2023 2128   NITRITE NEGATIVE 02/09/2023 2128   LEUKOCYTESUR NEGATIVE 02/09/2023 2128   LEUKOCYTESUR Negative 01/26/2012 0944   Sepsis Labs: @LABRCNTIP (procalcitonin:4,lacticidven:4) Microbiology: Recent Results (from the past 240 hours)  Blood culture (routine x 2)     Status: None (Preliminary result)   Collection Time: 11/08/23  5:10 PM   Specimen: BLOOD  Result Value Ref Range Status   Specimen Description BLOOD BLOOD LEFT HAND  Final   Special Requests   Final    BOTTLES DRAWN AEROBIC AND ANAEROBIC Blood Culture adequate volume   Culture   Final    NO GROWTH 4 DAYS Performed at [facility], [address] [place], [place] [address]    Report Status PENDING  Incomplete  Blood culture (routine x 2)     Status: None (Preliminary result)   Collection Time: 11/08/23  5:15 PM   Specimen: BLOOD  Result Value Ref Range Status   Specimen Description BLOOD BLOOD RIGHT ARM  Final   Special Requests   Final    BOTTLES DRAWN AEROBIC AND ANAEROBIC Blood Culture results may not be optimal due to an inadequate volume of blood  received in culture bottles   Culture   Final    NO GROWTH 4 DAYS Performed at [facility], [address], [place], [place] [address]    Report Status PENDING  Incomplete  Respiratory (~20 pathogens) panel by PCR     Status: None   Collection Time: 11/09/23 10:22 AM   Specimen: Expectorated Sputum; Respiratory  Result Value Ref Range Status   Adenovirus NOT DETECTED NOT DETECTED Final   Coronavirus 229E NOT DETECTED NOT DETECTED Final    Comment: (NOTE) The Coronavirus on the Respiratory Panel, DOES NOT test for the novel  Coronavirus (2019 nCoV)    Coronavirus HKU1 NOT DETECTED NOT DETECTED Final   Coronavirus NL63 NOT DETECTED NOT DETECTED Final   Coronavirus OC43 NOT DETECTED NOT DETECTED Final   Metapneumovirus NOT DETECTED NOT DETECTED Final   Rhinovirus / Enterovirus NOT DETECTED NOT DETECTED Final   Influenza A NOT DETECTED NOT DETECTED Final   Influenza B NOT DETECTED NOT DETECTED Final   Parainfluenza Virus 1 NOT DETECTED NOT DETECTED Final   Parainfluenza Virus 2 NOT DETECTED NOT DETECTED Final   Parainfluenza Virus 3 NOT DETECTED NOT DETECTED Final   Parainfluenza Virus 4 NOT DETECTED NOT DETECTED Final   Respiratory Syncytial Virus NOT DETECTED NOT DETECTED Final   Bordetella pertussis NOT DETECTED NOT DETECTED Final  Bordetella Parapertussis NOT DETECTED NOT DETECTED Final   Chlamydophila pneumoniae NOT DETECTED NOT DETECTED Final   Mycoplasma pneumoniae NOT DETECTED NOT DETECTED Final    Comment: Performed at [facility], [address], [place], [place] [address]  Expectorated Sputum Assessment w Gram Stain, Rflx to Resp Cult     Status: None   Collection Time: 11/09/23 10:38 AM   Specimen: Sputum  Result Value Ref Range Status   Specimen Description SPUTUM  Final   Special Requests Normal  Final   Sputum evaluation   Final    THIS SPECIMEN IS ACCEPTABLE FOR SPUTUM CULTURE Performed at [facility], [address],  [place], [place] [address]    Report Status 11/09/2023 FINAL  Final  Culture, Respiratory w Gram Stain     Status: None   Collection Time: 11/09/23 10:38 AM   Specimen: SPU  Result Value Ref Range Status   Specimen Description   Final    SPUTUM Performed at [facility], [address] [place], [place] [address]    Special Requests   Final    Normal Reflexed from [id number] Performed at [facility], [address] [place], [place] [address]    Gram Stain NO WBC SEEN RARE GRAM POSITIVE COCCI IN CHAINS   Final   Culture   Final    FEW Normal respiratory flora-no Staph aureus or Pseudomonas seen Performed at [facility], [address], [place], [place] [address]    Report Status 11/11/2023 FINAL  Final  MRSA Next Gen by PCR, Nasal     Status: None   Collection Time: 11/09/23 10:40 AM   Specimen: Expectorated Sputum; Nasal Swab  Result Value Ref Range Status   MRSA by PCR Next Gen NOT DETECTED NOT DETECTED Final    Comment: (NOTE) The GeneXpert MRSA Assay (FDA approved for NASAL specimens only), is one component of a comprehensive MRSA colonization surveillance program. It is not intended to diagnose MRSA infection nor to guide or monitor treatment for MRSA infections. Test performance is not FDA approved in patients less than 33 years old. Performed at [facility], [address], [place], [place] [address]       Radiology Studies last 3 days: ECHOCARDIOGRAM LIMITED Result Date: 11/11/2023    ECHOCARDIOGRAM LIMITED REPORT   Patient Name:   Paul Carpenter Date of Exam: 11/10/2023 Medical Rec #:  [id number]        Height:       72.0 in Accession #:    [id number]       Weight:       170.0 lb Date of Birth:  1955/11/21        BSA:          1.988 m Patient Age:    67 years         BP:           148/82 mmHg Patient Gender: M                HR:           94 bpm. Exam Location:  [facility] Procedure: Limited Echo and Intracardiac Opacification Agent  Indications:     Cardiomyopathy  History:         Patient has prior history of Echocardiogram examinations, most                  recent 11/10/2022. Cardiomyopathy and CHF, CAD, COPD,  Arrythmias:PVC, Signs/Symptoms:Shortness of Breath; Risk                  Factors:Hypertension, Current Smoker, Dyslipidemia and Sleep                  Apnea. Lung CA.  Sonographer:     Naomie Reef Referring Phys:  [id number] CARMEN L TAMEA Diagnosing Phys: Marsa Dooms MD  Sonographer Comments: Technically difficult study due to poor echo windows. Image acquisition challenging due to COPD and Image acquisition challenging due to respiratory motion. IMPRESSIONS  1. Left ventricular ejection fraction, by estimation, is 45 to 50%. The left ventricle has mildly decreased function. The left ventricle has no regional wall motion abnormalities. Left ventricular diastolic parameters are indeterminate.  2. Right ventricular systolic function is normal. The right ventricular size is normal.  3. The mitral valve is normal in structure. Mild mitral valve regurgitation. No evidence of mitral stenosis.  4. The aortic valve is normal in structure. Aortic valve regurgitation is not visualized. No aortic stenosis is present.  5. The inferior vena cava is normal in size with greater than 50% respiratory variability, suggesting right atrial pressure of 3 mmHg. FINDINGS  Left Ventricle: Left ventricular ejection fraction, by estimation, is 45 to 50%. The left ventricle has mildly decreased function. The left ventricle has no regional wall motion abnormalities. Definity  contrast agent was given IV to delineate the left ventricular endocardial borders. The left ventricular internal cavity size was normal in size. There is no left ventricular hypertrophy. Left ventricular diastolic parameters are indeterminate. Right Ventricle: The right ventricular size is normal. No increase in right ventricular wall thickness. Right ventricular  systolic function is normal. Left Atrium: Left atrial size was normal in size. Right Atrium: Right atrial size was normal in size. Pericardium: There is no evidence of pericardial effusion. Mitral Valve: The mitral valve is normal in structure. Mild mitral valve regurgitation. No evidence of mitral valve stenosis. Tricuspid Valve: The tricuspid valve is normal in structure. Tricuspid valve regurgitation is trivial. No evidence of tricuspid stenosis. Aortic Valve: The aortic valve is normal in structure. Aortic valve regurgitation is not visualized. No aortic stenosis is present. Pulmonic Valve: The pulmonic valve was normal in structure. Pulmonic valve regurgitation is not visualized. No evidence of pulmonic stenosis. Aorta: The aortic root is normal in size and structure. Venous: The inferior vena cava is normal in size with greater than 50% respiratory variability, suggesting right atrial pressure of 3 mmHg. IAS/Shunts: No atrial level shunt detected by color flow Doppler. LEFT VENTRICLE PLAX 2D LVIDd:         4.70 cm LVIDs:         3.50 cm LV PW:         1.20 cm LV IVS:        1.20 cm LVOT diam:     2.20 cm LVOT Area:     3.80 cm  LV Volumes (MOD) LV vol d, MOD A2C: 57.3 ml LV vol d, MOD A4C: 69.5 ml LV vol s, MOD A2C: 35.5 ml LV vol s, MOD A4C: 38.1 ml LV SV MOD A2C:     21.8 ml LV SV MOD A4C:     69.5 ml LV SV MOD BP:      27.5 ml RIGHT VENTRICLE RV Basal diam:  3.80 cm LEFT ATRIUM         Index LA diam:    3.40 cm 1.71 cm/m   AORTA Ao Root diam: 4.00 cm  SHUNTS Systemic Diam: 2.20 cm Marsa Dooms MD Electronically signed by Marsa Dooms MD Signature Date/Time: 11/11/2023/11:41:38 AM    Final    CT Angio Chest PE W and/or Wo Contrast Result Date: 11/08/2023 CLINICAL DATA:  Shortness of breath EXAM: CT ANGIOGRAPHY CHEST WITH CONTRAST TECHNIQUE: Multidetector CT imaging of the chest was performed using the standard protocol during bolus administration of intravenous contrast. Multiplanar CT image  reconstructions and MIPs were obtained to evaluate the vascular anatomy. RADIATION DOSE REDUCTION: This exam was performed according to the departmental dose-optimization program which includes automated exposure control, adjustment of the mA and/or kV according to patient size and/or use of iterative reconstruction technique. CONTRAST:  80mL OMNIPAQUE  IOHEXOL  350 MG/ML SOLN COMPARISON:  Chest x-ray 11/08/2023, CT chest 08/10/2023, 07/08/2023, 05/13/2023 and multiple prior exams FINDINGS: Cardiovascular: Satisfactory opacification of the pulmonary arteries to the segmental level. No evidence of pulmonary embolism. Nonaneurysmal aorta. Moderate atherosclerosis. Coronary vascular calcification. Normal cardiac size. Small pericardial effusion Mediastinum/Nodes: Patent trachea. No suspicious thyroid  mass. Calcified mediastinal lymph nodes consistent with prior granulomatous disease. Similar mild mediastinal lymph nodes. Esophagus within normal limits. Lungs/Pleura: Large thick-walled cavitary mass at the right apex with internal debris, measures about 8.6 x 6.9 by 9.4 cm on series 5, image 38 and coronal series 7, image 104. Cavitary mass directly communicates with right upper lobe airway, series 5 image 52 through 56. Advanced emphysema. Multiple solid bilateral pulmonary nodules are again noted. Grossly stable distribution when compared with prior exam. Index irregular right lung base pulmonary nodule on series 5, image 124 measures 12 x 10 mm previously 12 x 8 mm. Index left lower lobe pulmonary nodule on series 5, image 82 measures 10 x 10 mm previously 11 x 10 mm. Upper Abdomen: No acute finding Musculoskeletal: Similar chronic irregularity of right first and second ribs. No acute osseous abnormality. Review of the MIP images confirms the above findings. IMPRESSION: 1. Negative for acute pulmonary embolus. 2. Large thick-walled cavitary mass at the right apex with internal debris, grossly stable. Cavitary mass  directly communicates with right upper lobe airway as seen on prior exam. 3. Multiple solid bilateral pulmonary nodules, grossly stable distribution when compared with prior exam. Measured index nodules are grossly stable. 4. Advanced emphysema. 5. Aortic atherosclerosis. Aortic Atherosclerosis (ICD10-I70.0) and Emphysema (ICD10-J43.9). Electronically Signed   By: Luke Bun M.D.   On: 11/08/2023 20:17   DG Chest Portable 1 View Result Date: 11/08/2023 CLINICAL DATA:  Shortness of breath. EXAM: PORTABLE CHEST 1 VIEW COMPARISON:  February 09, 2023. FINDINGS: The heart size and mediastinal contours are within normal limits. Right internal jugular Port-A-Cath is unchanged. Stable cavitary lesion is noted in right lung apex concerning for malignancy. Small pulmonary nodules may be present in both lungs as noted on prior exams. The visualized skeletal structures are unremarkable. IMPRESSION: Stable cavitary lesion seen in right lung apex concerning for malignancy. Small pulmonary nodules may be present bilaterally as described on prior studies. Electronically Signed   By: Lynwood Landy Raddle M.D.   On: 11/08/2023 17:36       Time spent: 50 min     Laneta Marsa, DO [facility] 11/12/2023, 4:42 PM    Dictation software may have been used to generate the above note. Typos may occur and escape review in typed/dictated notes. Please contact Dr Marsa directly for clarity if needed.  Staff may message me via secure chat in Epic  but this may not receive an immediate response,  please page me  for urgent matters!  If 7PM-7AM, please contact night coverage [email or web address]

## 2023-11-12 NOTE — Progress Notes (Signed)
                                                                                                                                                                                                           Daily Progress Note   Patient Name: Paul Carpenter       Date: 11/12/2023 DOB: 1956-01-26  Age: 68 y.o. MRN#: [id number] Attending Physician: Marsa Edelman, DO Primary Care Physician: Sadie Manna, MD Admit Date: 11/08/2023  Reason for Consultation/Follow-up: Establishing goals of care  HPI/Brief Hospital Review: 68 y.o. 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    Patient is being followed by ID.   Patient been treated for COPD exacerbation, resistant MAC with possible superimposed infection, and chronic respiratory failure with hypoxia.   PMT was consulted to discuss GOC.   Subjective: Extensive chart review has been completed prior to meeting patient including labs, vital signs, imaging, progress notes, orders, and available advanced directive documents from current and previous encounters.    Visited with Paul Carpenter at his bedside. He is resting in bed and does not acknowledge my presence in room. Per conversations had yesterday, goals remain clear. No family at bedside during time of visit.  Spoke with nursing staff, no acute needs or concerns. Requested nursing staff reach out to PMT if needs or concerns arise throughout shift. Will attempt to visit later today or tomorrow for support.  Returned to bedside to visit with Paul Carpenter. He is awake, alert, sitting on side of bed. He reports feeling well today, no acute complaints, was able to walk around unit today without becoming hypoxic. He is anxious to return home and hopes discharge is a  possibility tomorrow.  Answered and addressed all questions and concerns. PMT to continue to follow for ongoing needs and support.  Thank you for allowing the Palliative Medicine Team to assist in the care of this patient.  Total time:  25 minutes  Time spent includes: Detailed review of medical records (labs, imaging, vital signs), medically appropriate exam (mental status, respiratory, cardiac, skin), discussed with treatment team, counseling and educating patient, family and staff, documenting clinical information, medication management and coordination of care.  Paul Lesches, DNP, AGNP-C Palliative Medicine   Please contact Palliative Medicine Team phone at [phone] for questions and concerns.

## 2023-11-13 DIAGNOSIS — J441 Chronic obstructive pulmonary disease with (acute) exacerbation: Secondary | ICD-10-CM | POA: Diagnosis not present

## 2023-11-13 DIAGNOSIS — A310 Pulmonary mycobacterial infection: Secondary | ICD-10-CM | POA: Diagnosis not present

## 2023-11-13 DIAGNOSIS — J984 Other disorders of lung: Secondary | ICD-10-CM | POA: Diagnosis not present

## 2023-11-13 DIAGNOSIS — Z515 Encounter for palliative care: Secondary | ICD-10-CM | POA: Diagnosis not present

## 2023-11-13 DIAGNOSIS — C3491 Malignant neoplasm of unspecified part of right bronchus or lung: Secondary | ICD-10-CM | POA: Diagnosis not present

## 2023-11-13 DIAGNOSIS — J9621 Acute and chronic respiratory failure with hypoxia: Secondary | ICD-10-CM | POA: Diagnosis not present

## 2023-11-13 LAB — BASIC METABOLIC PANEL
Anion gap: 11 (ref 5–15)
BUN: 12 mg/dL (ref 8–23)
CO2: 30 mmol/L (ref 22–32)
Calcium: 8.4 mg/dL — ABNORMAL LOW (ref 8.9–10.3)
Chloride: 89 mmol/L — ABNORMAL LOW (ref 98–111)
Creatinine, Ser: 0.66 mg/dL (ref 0.61–1.24)
GFR, Estimated: >60 mL/min (ref >60)
Glucose, Bld: 136 mg/dL — ABNORMAL HIGH (ref 70–99)
Potassium: 4.2 mmol/L (ref 3.5–5.1)
Sodium: 130 mmol/L — ABNORMAL LOW (ref 135–145)

## 2023-11-13 LAB — CULTURE, BLOOD (ROUTINE X 2)
Culture: NO GROWTH
Culture: NO GROWTH
Special Requests: ADEQUATE

## 2023-11-13 MED ORDER — GUAIFENESIN ER 600 MG PO TB12
600.0000 mg | ORAL_TABLET | Freq: Two times a day (BID) | ORAL | Status: DC
  Administered 2023-11-13 – 2023-11-14 (×2): 600 mg via ORAL
  Filled 2023-11-13 (×2): qty 1

## 2023-11-13 MED ORDER — GUAIFENESIN ER 600 MG PO TB12: 1200.0000 mg | ORAL_TABLET | Freq: Two times a day (BID) | ORAL | Status: DC

## 2023-11-13 MED ORDER — SODIUM CHLORIDE 1 G PO TABS
2.0000 g | ORAL_TABLET | Freq: Three times a day (TID) | ORAL | Status: DC
  Administered 2023-11-13: 2 g via ORAL
  Filled 2023-11-13: qty 2

## 2023-11-13 MED ORDER — GUAIFENESIN-DM 100-10 MG/5ML PO SYRP
10.0000 mL | ORAL_SOLUTION | ORAL | Status: DC | PRN
  Administered 2023-11-13 – 2023-11-14 (×3): 10 mL via ORAL
  Filled 2023-11-13 (×3): qty 10

## 2023-11-13 MED ORDER — SODIUM CHLORIDE 1 G PO TABS
5.0000 g | ORAL_TABLET | Freq: Three times a day (TID) | ORAL | Status: DC
  Administered 2023-11-13 – 2023-11-14 (×4): 5 g via ORAL
  Filled 2023-11-13 (×4): qty 5

## 2023-11-13 NOTE — Progress Notes (Signed)
                                                                                                                                                                                                           Daily Progress Note   Patient Name: Paul Carpenter       Date: 11/13/2023 DOB: 1956-07-21  Age: 68 y.o. MRN#: [id number] Attending Physician: Marsa Edelman, DO Primary Care Physician: Sadie Manna, MD Admit Date: 11/08/2023  Reason for Consultation/Follow-up: Establishing goals of care  HPI/Brief Hospital Review: 68 y.o. 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    Patient is being followed by ID.   Patient been treated for COPD exacerbation, resistant MAC with possible superimposed infection, and chronic respiratory failure with hypoxia.   PMT was consulted to discuss GOC.   Subjective: Extensive chart review has been completed prior to meeting patient including labs, vital signs, imaging, progress notes, orders, and available advanced directive documents from current and previous encounters.    Visited with Mr. Chestnutt at his bedside. He is awake, alert, resting in bed. He shares he feels well today without acute complaints. He is anxious to return home and is hopeful that will be tomorrow.  Encouraged Mr. Cothran to continue ongoing goals of care discussions with all members of his care team even on outpatient basis.  Answered and addressed all questions and concerns. No acute ongoing palliative needs. PMT to follow peripherally, please reengage as appropriate.  Thank you for allowing the Palliative Medicine Team to assist in the care of this patient.  Total time:  25 minutes  Time spent includes: Detailed review of medical records (labs,  imaging, vital signs), medically appropriate exam (mental status, respiratory, cardiac, skin), discussed with treatment team, counseling and educating patient, family and staff, documenting clinical information, medication management and coordination of care.  Waddell Lesches, DNP, AGNP-C Palliative Medicine   Please contact Palliative Medicine Team phone at [phone] for questions and concerns.

## 2023-11-13 NOTE — Progress Notes (Signed)
 PROGRESS NOTE    Paul Carpenter   FMW:[id number] DOB: Aug 19, 1956  DOA: 11/08/2023 Date of Service: 11/13/23 which is hospital day 5  PCP: Sadie Manna, MD    Hospital course / significant events:   HPI: Paul Carpenter is a 68 y.o. 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 Tamea for IV antibiotics and steroids after failing outpatient management of her COPD exacerbation.  Patient has had progressive shortness of breath for the last 4 to 5 weeks worse over the last 2 to 3 days associated with cough productive of green sputum and occasional hemoptysis.  Has been on prednisone  and amikacin  without much improvement.  Uses O2 at 2 L with O2 sats at home have been 82 to 85% on home flow rate.    01/07: admitted to hospitalist service.CTA chest PE protocol negative for PE, showing known cavitary mass, showing stable pulmonary nodules that are related to his MAC which do show waxing and waning pattern of size change but overall the nodules are stable, CT also of course shows advanced emphysema. Got DuoNebs and Solu-Medrol  and started on cefepime  and vancomycin .  01/08: per ID restart amikacin  and ethambutol  and continue azithromycin  and clofazamine. Per Pulm appears to be some improved with regards to work of breathing but continues to have significant cough and dyspnea, ordered Echo, tussionex, albuterol  nebs, revefenacin  nebs, pulmicort  nebs, aformoterol nebs 01/09-01/10: continue pulmonary treatments and abx, final cultures and fungitell pending. Resp culture rare GPC chains. Echo EF 45-50%. Per ID anticipate dc Mon once completed cefepime  if cx remain neg  01/11-01/12: normal resp flora on resp culture. Fungitell still pending. Running cefepime .      Consultants:  Pulmonology Infectious  Disease  Palliative Care   Procedures/Surgeries: none      ASSESSMENT & PLAN:   COPD with acute exacerbation  Resistant MAC with possible superimposed infection Chronic respiratory failure with hypoxia Patient failing amikacin  and prednisone  outpatient Per pulmonary, he has been in progressive decline for over a year.  Despite his many comorbidities he continues to smoke 1 to 2 packs of cigarettes per day.  No concerns on Echo Per ID - Tx MAC amikacin , ethambutol , azithromycin  and clofazamine. Also on IV cefepime  plan to complete per ID likely Monday and anticipate discharge then if no concerns on culture Per Pulm - albuterol  nebs prn, revefenacin  nebs, pulmicort  nebs bid, aformoterol nebs Continue steroids SoluMedrol 40 mg IV q12h Guaifenesin  scheduled, tussionex prn  Resp culture - likely strep, normal flora, no staph or pseudomonas  fungitell pending    Non-small cell lung cancer, right Cavitary mass/stable pulmonary nodules No acute issues suspected Follow-up with oncology   SIADH (syndrome of inappropriate ADH production) (HCC) Hyponatremia Stable Monitor BMP daily Normal sodium, holding home salt tabs for now   Anxiety/insomnia Chronic benzodiazepine use Home valium  ok for prn at bedtime but concern given higher CO2 on AM labs, if respiratory insufficiency at night is a concern may need d/c benzos and/or higher O2 at bedtime    Chronic adrenal insufficiency Home steroids on hold while getting high-dose steroids here   Benign essential hypertension Continue losartan and metoprolol    Hypothyroidism, acquired Continue levothyroxine    Frequent PVCs Continue metoprolol    Tobacco use disorder Nicotine  patch   Met alk / resp ac  DVT prophylaxis: lovenox   IV fluids: no continuous IV fluids  Nutrition: regular diet Central lines / invasive devices: has port in place  Code Status: FULL CODE ACP documentation reviewed:  none on file in [facility]  TOC  needs: none at this time Barriers to dispo / significant pending items:  ID/Pulm clerance prior to dc              Subjective / Brief ROS:  Patient reports cough feels like its drying up a bit  Denies CP/SOB at rest Pain controlled.  Tolerating diet.   Family Communication: none at this time     Objective Findings:  Vitals:   11/12/23 2038 11/13/23 0414 11/13/23 0735 11/13/23 0754  BP:  (!) 155/85  139/85  Pulse:  81  71  Resp:  18  17  Temp:  98.6 F (37 C)  97.7 F (36.5 C)  TempSrc:  Oral    SpO2: 97% 92% 99% 99%  Weight:      Height:        Intake/Output Summary (Last 24 hours) at 11/13/2023 1512 Last data filed at 11/13/2023 1420 Gross per 24 hour  Intake 200 ml  Output 1350 ml  Net -1150 ml   Filed Weights   11/08/23 1654  Weight: 77.1 kg    Examination:  Physical Exam Constitutional:      General: He is not in acute distress. Cardiovascular:     Rate and Rhythm: Normal rate and regular rhythm.  Pulmonary:     Effort: Pulmonary effort is normal. No respiratory distress.     Breath sounds: Decreased breath sounds and wheezing (diffuse, mild) present.  Musculoskeletal:     Right lower leg: No edema.     Left lower leg: No edema.  Neurological:     General: No focal deficit present.     Mental Status: He is alert and oriented to person, place, and time.  Psychiatric:        Mood and Affect: Mood normal.        Behavior: Behavior normal.          Scheduled Medications:   arformoterol   15 mcg Nebulization BID   atorvastatin   10 mg Oral Daily   azithromycin   500 mg Oral q1800   budesonide  (PULMICORT ) nebulizer solution  0.5 mg Nebulization BID   clofazimine   100 mg Oral Q breakfast   enoxaparin  (LOVENOX ) injection  40 mg Subcutaneous Q24H   ethambutol   1,200 mg Oral Daily   folic acid   1 mg Oral Daily   guaiFENesin   600 mg Oral BID   levothyroxine   175 mcg Oral Q0600   magnesium  oxide  600 mg Oral BID   methylPREDNISolone   (SOLU-MEDROL ) injection  40 mg Intravenous Q12H   nicotine   21 mg Transdermal Daily   pantoprazole   40 mg Oral Daily   revefenacin   175 mcg Nebulization Daily   sodium chloride   2 g Oral TID WC    Continuous Infusions:  amikacin  (AMIKIN ) 750 mg in dextrose  5 % 100 mL IVPB 750 mg (11/13/23 1015)    PRN Medications:  acetaminophen  **OR** acetaminophen , albuterol , chlorpheniramine-HYDROcodone , diazepam , guaiFENesin -dextromethorphan , HYDROcodone -acetaminophen , melatonin, metoprolol  succinate, ondansetron  **OR** ondansetron  (ZOFRAN ) IV  Antimicrobials from admission:  Anti-infectives (From admission, onward)    Start     Dose/Rate Route Frequency Ordered Stop   11/10/23 1800  azithromycin  (ZITHROMAX ) tablet 500 mg        500 mg Oral Daily-1800 11/10/23 1435     11/10/23 1000  ethambutol  (MYAMBUTOL ) tablet 1,200 mg        1,200 mg Oral Daily 11/09/23 1900     11/10/23 0800  clofazimine  50 mg capsule (for compassionate use)  Status:  Discontinued        100 mg Oral Daily with breakfast 11/09/23 0904 11/09/23 1237   11/10/23 0800  amikacin  (AMIKIN ) 750 mg in dextrose  5 % 100 mL IVPB        750 mg 103 mL/hr over 60 Minutes Intravenous Every 24 hours 11/09/23 1900     11/09/23 1245  clofazimine  50 mg capsule (for compassionate use)        100 mg Oral Daily with breakfast 11/09/23 1237     11/09/23 1000  ethambutol  (MYAMBUTOL ) tablet 1,200 mg  Status:  Discontinued        1,200 mg Oral Daily 11/09/23 0904 11/09/23 0914   11/09/23 0900  vancomycin  (VANCOREADY) IVPB 1250 mg/250 mL  Status:  Discontinued        1,250 mg 166.7 mL/hr over 90 Minutes Intravenous Every 12 hours 11/08/23 2351 11/09/23 1857   11/09/23 0300  ceFEPIme  (MAXIPIME ) 2 g in sodium chloride  0.9 % 100 mL IVPB        2 g 200 mL/hr over 30 Minutes Intravenous Every 8 hours 11/08/23 2330 11/13/23 1419   11/08/23 2345  azithromycin  (ZITHROMAX ) 500 mg in sodium chloride  0.9 % 250 mL IVPB  Status:  Discontinued        500 mg 250  mL/hr over 60 Minutes Intravenous Every 24 hours 11/08/23 2330 11/10/23 1435   11/08/23 1900  ceFEPIme  (MAXIPIME ) 2 g in sodium chloride  0.9 % 100 mL IVPB        2 g 200 mL/hr over 30 Minutes Intravenous  Once 11/08/23 1834 11/08/23 2036   11/08/23 1900  vancomycin  (VANCOREADY) IVPB 1750 mg/350 mL        1,750 mg 175 mL/hr over 120 Minutes Intravenous  Once 11/08/23 1834 11/08/23 2325           Data Reviewed:  I have personally reviewed the following...  CBC: Recent Labs  Lab 11/08/23 1710 11/09/23 0555 11/12/23 0557  WBC 9.7 7.5 8.7  NEUTROABS 9.2*  --   --   HGB 14.3 13.3 13.2  HCT 41.3 38.9* 39.1  MCV 92.0 91.1 93.3  PLT 213 204 180   Basic Metabolic Panel: Recent Labs  Lab 11/09/23 0555 11/10/23 0405 11/11/23 0605 11/12/23 0557 11/13/23 0536  NA 135 135 135 133* 130*  K 4.2 3.9 4.2 4.3 4.2  CL 96* 95* 92* 94* 89*  CO2 29 29 34* 34* 30  GLUCOSE 112* 128* 109* 125* 136*  BUN 11 12 14 13 12   CREATININE 0.66 0.70 0.78 0.73 0.66  CALCIUM  8.7* 8.5* 8.5* 8.3* 8.4*   GFR: Estimated Creatinine Clearance: 99.1 mL/min (by C-G formula based on SCr of 0.66 mg/dL). Liver Function Tests: Recent Labs  Lab 11/08/23 1710  AST 19  ALT 17  ALKPHOS 77  BILITOT 0.6  PROT 6.3*  ALBUMIN 3.8   No results for input(s): LIPASE, AMYLASE in the last 168 hours. No results for input(s): AMMONIA in the last 168 hours. Coagulation Profile: No results for input(s): INR, PROTIME in the last 168 hours. Cardiac Enzymes: No results for input(s): CKTOTAL, CKMB, CKMBINDEX, TROPONINI in the last 168 hours. BNP (last 3 results) No results for input(s): PROBNP in the last 8760 hours. HbA1C: No results for input(s): HGBA1C in the last 72 hours. CBG:  No results for input(s): GLUCAP in the last 168 hours. Lipid Profile: No results for input(s): CHOL, HDL, LDLCALC, TRIG, CHOLHDL, LDLDIRECT in the last 72 hours. Thyroid  Function Tests: No results  for input(s): TSH, T4TOTAL, FREET4, T3FREE, THYROIDAB in the last 72 hours. Anemia Panel: No results for input(s): VITAMINB12, FOLATE, FERRITIN, TIBC, IRON , RETICCTPCT in the last 72 hours. Most Recent Urinalysis On File:     Component Value Date/Time   COLORURINE YELLOW (A) 02/09/2023 2128   APPEARANCEUR CLEAR (A) 02/09/2023 2128   APPEARANCEUR Clear 08/15/2020 0951   LABSPEC 1.013 02/09/2023 2128   LABSPEC 1.004 01/26/2012 0944   PHURINE 6.0 02/09/2023 2128   GLUCOSEU NEGATIVE 02/09/2023 2128   GLUCOSEU Negative 01/26/2012 0944   HGBUR NEGATIVE 02/09/2023 2128   BILIRUBINUR NEGATIVE 02/09/2023 2128   BILIRUBINUR Negative 08/15/2020 0951   BILIRUBINUR Negative 01/26/2012 0944   KETONESUR NEGATIVE 02/09/2023 2128   PROTEINUR NEGATIVE 02/09/2023 2128   NITRITE NEGATIVE 02/09/2023 2128   LEUKOCYTESUR NEGATIVE 02/09/2023 2128   LEUKOCYTESUR Negative 01/26/2012 0944   Sepsis Labs: @LABRCNTIP (procalcitonin:4,lacticidven:4) Microbiology: Recent Results (from the past 240 hours)  Blood culture (routine x 2)     Status: None   Collection Time: 11/08/23  5:10 PM   Specimen: BLOOD  Result Value Ref Range Status   Specimen Description BLOOD BLOOD LEFT HAND  Final   Special Requests   Final    BOTTLES DRAWN AEROBIC AND ANAEROBIC Blood Culture adequate volume   Culture   Final    NO GROWTH 5 DAYS Performed at [facility], [address] [place], [place] [address]    Report Status 11/13/2023 FINAL  Final  Blood culture (routine x 2)     Status: None   Collection Time: 11/08/23  5:15 PM   Specimen: BLOOD  Result Value Ref Range Status   Specimen Description BLOOD BLOOD RIGHT ARM  Final   Special Requests   Final    BOTTLES DRAWN AEROBIC AND ANAEROBIC Blood Culture results may not be optimal due to an inadequate volume of blood received in culture bottles   Culture   Final    NO GROWTH 5 DAYS Performed at [facility], [address]  [place], [place] [address]    Report Status 11/13/2023 FINAL  Final  Respiratory (~20 pathogens) panel by PCR     Status: None   Collection Time: 11/09/23 10:22 AM   Specimen: Expectorated Sputum; Respiratory  Result Value Ref Range Status   Adenovirus NOT DETECTED NOT DETECTED Final   Coronavirus 229E NOT DETECTED NOT DETECTED Final    Comment: (NOTE) The Coronavirus on the Respiratory Panel, DOES NOT test for the novel  Coronavirus (2019 nCoV)    Coronavirus HKU1 NOT DETECTED NOT DETECTED Final   Coronavirus NL63 NOT DETECTED NOT DETECTED Final   Coronavirus OC43 NOT DETECTED NOT DETECTED Final   Metapneumovirus NOT DETECTED NOT DETECTED Final   Rhinovirus / Enterovirus NOT DETECTED NOT DETECTED Final   Influenza A NOT DETECTED NOT DETECTED Final   Influenza B NOT DETECTED NOT DETECTED Final   Parainfluenza Virus 1 NOT DETECTED NOT DETECTED Final   Parainfluenza Virus 2 NOT DETECTED NOT DETECTED Final   Parainfluenza Virus 3 NOT DETECTED NOT DETECTED Final   Parainfluenza Virus 4 NOT DETECTED NOT DETECTED Final   Respiratory Syncytial Virus NOT DETECTED NOT DETECTED Final   Bordetella pertussis NOT DETECTED NOT DETECTED Final   Bordetella Parapertussis NOT DETECTED NOT DETECTED Final   Chlamydophila pneumoniae NOT DETECTED  NOT DETECTED Final   Mycoplasma pneumoniae NOT DETECTED NOT DETECTED Final    Comment: Performed at [facility], [address] [place], [place] [address]  Expectorated Sputum Assessment w Gram Stain, Rflx to Resp Cult     Status: None   Collection Time: 11/09/23 10:38 AM   Specimen: Sputum  Result Value Ref Range Status   Specimen Description SPUTUM  Final   Special Requests Normal  Final   Sputum evaluation   Final    THIS SPECIMEN IS ACCEPTABLE FOR SPUTUM CULTURE Performed at [facility], [address], [place], [place] [address]    Report Status 11/09/2023 FINAL  Final  Culture, Respiratory w Gram Stain     Status: None   Collection  Time: 11/09/23 10:38 AM   Specimen: SPU  Result Value Ref Range Status   Specimen Description   Final    SPUTUM Performed at [facility], [address] [place], [place] [address]    Special Requests   Final    Normal Reflexed from [phone] Performed at [facility], [address] [place], [place] [address]    Gram Stain NO WBC SEEN RARE GRAM POSITIVE COCCI IN CHAINS   Final   Culture   Final    FEW Normal respiratory flora-no Staph aureus or Pseudomonas seen Performed at [facility], [address], [place], [place] [address]    Report Status 11/11/2023 FINAL  Final  MRSA Next Gen by PCR, Nasal     Status: None   Collection Time: 11/09/23 10:40 AM   Specimen: Expectorated Sputum; Nasal Swab  Result Value Ref Range Status   MRSA by PCR Next Gen NOT DETECTED NOT DETECTED Final    Comment: (NOTE) The GeneXpert MRSA Assay (FDA approved for NASAL specimens only), is one component of a comprehensive MRSA colonization surveillance program. It is not intended to diagnose MRSA infection nor to guide or monitor treatment for MRSA infections. Test performance is not FDA approved in patients less than 40 years old. Performed at [facility], [address], [place], [place] [address]       Radiology Studies last 3 days: ECHOCARDIOGRAM LIMITED Result Date: 11/11/2023    ECHOCARDIOGRAM LIMITED REPORT   Patient Name:   Paul Carpenter Date of Exam: 11/10/2023 Medical Rec #:  [id number]        Height:       72.0 in Accession #:    [id number]       Weight:       170.0 lb Date of Birth:  18-Oct-1956        BSA:          1.988 m Patient Age:    67 years         BP:           148/82 mmHg Patient Gender: M                HR:           94 bpm. Exam Location:  [facility] Procedure: Limited Echo and Intracardiac Opacification Agent Indications:     Cardiomyopathy  History:         Patient has prior history of Echocardiogram examinations, most                  recent  11/10/2022. Cardiomyopathy and CHF, CAD, COPD,  Arrythmias:PVC, Signs/Symptoms:Shortness of Breath; Risk                  Factors:Hypertension, Current Smoker, Dyslipidemia and Sleep                  Apnea. Lung CA.  Sonographer:     Naomie Reef Referring Phys:  [id number] CARMEN L TAMEA Diagnosing Phys: Marsa Dooms MD  Sonographer Comments: Technically difficult study due to poor echo windows. Image acquisition challenging due to COPD and Image acquisition challenging due to respiratory motion. IMPRESSIONS  1. Left ventricular ejection fraction, by estimation, is 45 to 50%. The left ventricle has mildly decreased function. The left ventricle has no regional wall motion abnormalities. Left ventricular diastolic parameters are indeterminate.  2. Right ventricular systolic function is normal. The right ventricular size is normal.  3. The mitral valve is normal in structure. Mild mitral valve regurgitation. No evidence of mitral stenosis.  4. The aortic valve is normal in structure. Aortic valve regurgitation is not visualized. No aortic stenosis is present.  5. The inferior vena cava is normal in size with greater than 50% respiratory variability, suggesting right atrial pressure of 3 mmHg. FINDINGS  Left Ventricle: Left ventricular ejection fraction, by estimation, is 45 to 50%. The left ventricle has mildly decreased function. The left ventricle has no regional wall motion abnormalities. Definity  contrast agent was given IV to delineate the left ventricular endocardial borders. The left ventricular internal cavity size was normal in size. There is no left ventricular hypertrophy. Left ventricular diastolic parameters are indeterminate. Right Ventricle: The right ventricular size is normal. No increase in right ventricular wall thickness. Right ventricular systolic function is normal. Left Atrium: Left atrial size was normal in size. Right Atrium: Right atrial size was normal in size.  Pericardium: There is no evidence of pericardial effusion. Mitral Valve: The mitral valve is normal in structure. Mild mitral valve regurgitation. No evidence of mitral valve stenosis. Tricuspid Valve: The tricuspid valve is normal in structure. Tricuspid valve regurgitation is trivial. No evidence of tricuspid stenosis. Aortic Valve: The aortic valve is normal in structure. Aortic valve regurgitation is not visualized. No aortic stenosis is present. Pulmonic Valve: The pulmonic valve was normal in structure. Pulmonic valve regurgitation is not visualized. No evidence of pulmonic stenosis. Aorta: The aortic root is normal in size and structure. Venous: The inferior vena cava is normal in size with greater than 50% respiratory variability, suggesting right atrial pressure of 3 mmHg. IAS/Shunts: No atrial level shunt detected by color flow Doppler. LEFT VENTRICLE PLAX 2D LVIDd:         4.70 cm LVIDs:         3.50 cm LV PW:         1.20 cm LV IVS:        1.20 cm LVOT diam:     2.20 cm LVOT Area:     3.80 cm  LV Volumes (MOD) LV vol d, MOD A2C: 57.3 ml LV vol d, MOD A4C: 69.5 ml LV vol s, MOD A2C: 35.5 ml LV vol s, MOD A4C: 38.1 ml LV SV MOD A2C:     21.8 ml LV SV MOD A4C:     69.5 ml LV SV MOD BP:      27.5 ml RIGHT VENTRICLE RV Basal diam:  3.80 cm LEFT ATRIUM         Index LA diam:    3.40 cm 1.71 cm/m   AORTA Ao Root diam: 4.00 cm  SHUNTS Systemic Diam: 2.20 cm Marsa Dooms MD Electronically signed by Marsa Dooms MD Signature Date/Time: 11/11/2023/11:41:38 AM    Final        Time spent: 50 min     Laneta Marsa, DO [facility] 11/13/2023, 3:12 PM    Dictation software may have been used to generate the above note. Typos may occur and escape review in typed/dictated notes. Please contact Dr Marsa directly for clarity if needed.  Staff may message me via secure chat in Epic  but this may not receive an immediate response,  please page me for urgent matters!  If  7PM-7AM, please contact night coverage [email or web address]

## 2023-11-14 DIAGNOSIS — F17200 Nicotine dependence, unspecified, uncomplicated: Secondary | ICD-10-CM | POA: Diagnosis not present

## 2023-11-14 DIAGNOSIS — A310 Pulmonary mycobacterial infection: Secondary | ICD-10-CM | POA: Diagnosis not present

## 2023-11-14 DIAGNOSIS — J441 Chronic obstructive pulmonary disease with (acute) exacerbation: Secondary | ICD-10-CM | POA: Diagnosis not present

## 2023-11-14 DIAGNOSIS — J9621 Acute and chronic respiratory failure with hypoxia: Secondary | ICD-10-CM | POA: Diagnosis not present

## 2023-11-14 LAB — BASIC METABOLIC PANEL
Anion gap: 8 (ref 5–15)
BUN: 13 mg/dL (ref 8–23)
CO2: 31 mmol/L (ref 22–32)
Calcium: 8.2 mg/dL — ABNORMAL LOW (ref 8.9–10.3)
Chloride: 92 mmol/L — ABNORMAL LOW (ref 98–111)
Creatinine, Ser: 0.65 mg/dL (ref 0.61–1.24)
GFR, Estimated: >60 mL/min (ref >60)
Glucose, Bld: 132 mg/dL — ABNORMAL HIGH (ref 70–99)
Potassium: 4.5 mmol/L (ref 3.5–5.1)
Sodium: 131 mmol/L — ABNORMAL LOW (ref 135–145)

## 2023-11-14 MED ORDER — BUDESONIDE 0.5 MG/2ML IN SUSP: 0.5000 mg | Freq: Two times a day (BID) | RESPIRATORY_TRACT | 0 refills | Status: DC

## 2023-11-14 MED ORDER — HYDROCODONE-ACETAMINOPHEN 5-325 MG PO TABS: 1.0000 | ORAL_TABLET | Freq: Four times a day (QID) | ORAL | 0 refills | Status: AC | PRN

## 2023-11-14 MED ORDER — GUAIFENESIN ER 600 MG PO TB12: 600.0000 mg | ORAL_TABLET | Freq: Two times a day (BID) | ORAL | 0 refills | Status: AC

## 2023-11-14 MED ORDER — SODIUM CHLORIDE 1 G PO TABS: 5.0000 g | ORAL_TABLET | Freq: Three times a day (TID) | ORAL | Status: AC

## 2023-11-14 MED ORDER — ARFORMOTEROL TARTRATE 15 MCG/2ML IN NEBU: 15.0000 ug | INHALATION_SOLUTION | Freq: Two times a day (BID) | RESPIRATORY_TRACT | 0 refills | Status: DC

## 2023-11-14 MED ORDER — METHYLPREDNISOLONE 4 MG PO TBPK: ORAL_TABLET | ORAL | 0 refills | Status: DC

## 2023-11-14 MED ORDER — HYDROCORTISONE 10 MG PO TABS: ORAL_TABLET | ORAL | Status: DC

## 2023-11-14 MED ORDER — HYDROCOD POLI-CHLORPHE POLI ER 10-8 MG/5ML PO SUER: 5.0000 mL | Freq: Two times a day (BID) | ORAL | 0 refills | Status: DC | PRN

## 2023-11-14 MED ORDER — REVEFENACIN 175 MCG/3ML IN SOLN: 175.0000 ug | Freq: Every day | RESPIRATORY_TRACT | 0 refills | Status: DC

## 2023-11-14 NOTE — Discharge Summary (Signed)
 Physician Discharge Summary   Patient: Paul Carpenter MRN: [id number]  DOB: [birth year]   Admit:     Date of Admission: 11/08/2023 Admitted from: home   Discharge: Date of discharge: 11/14/23 Disposition: Home Condition at discharge: good  CODE STATUS: FULL CODE     Discharge Physician: Laneta Blunt, DO [facility]     PCP: Sadie Manna, MD  Recommendations for Outpatient Follow-up:  Follow up with PCP Sadie Manna, MD in 1-2 weeks Follow up with [facility] infectious disease in 1-2 weeks  Follow up with pulmonary in 1 week as scheduled    Discharge Instructions     Diet general   Complete by: As directed    Increase activity slowly   Complete by: As directed          Discharge Diagnoses: Principal Problem:   COPD with acute exacerbation (HCC) Active Problems:   Non-small cell lung cancer, right (HCC)   SIADH (syndrome of inappropriate ADH production) (HCC)   Obstructive sleep apnea   Benign essential hypertension   Hypothyroidism, acquired   Tobacco use disorder   Carotid artery disease (HCC)   Frequent PVCs   Hyponatremia   Pulmonary cavitary lesion   Chronic respiratory failure with hypoxia (HCC)   Chronic adrenal insufficiency (HCC)   Acute on chronic hypoxic respiratory failure (HCC)   Mycobacterium avium-intracellulare infection [facility])        Hospital course / significant events:   HPI: Paul Carpenter is a 68 y.o. 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 Tamea for IV antibiotics and steroids after failing outpatient management of her COPD exacerbation.  Patient has had progressive shortness of breath for the last 4 to 5 weeks worse over the last 2 to 3 days associated with cough productive of green sputum and occasional  hemoptysis.  Has been on prednisone  and amikacin  without much improvement.  Uses O2 at 2 L with O2 sats at home have been 82 to 85% on home flow rate.    01/07: admitted to hospitalist service.CTA chest PE protocol negative for PE, showing known cavitary mass, showing stable pulmonary nodules that are related to his MAC which do show waxing and waning pattern of size change but overall the nodules are stable, CT also of course shows advanced emphysema. Got DuoNebs and Solu-Medrol  and started on cefepime  and vancomycin .  01/08: per ID restart amikacin  and ethambutol  and continue azithromycin  and clofazamine. Per Pulm appears to be some improved with regards to work of breathing but continues to have significant cough and dyspnea, ordered Echo, tussionex, albuterol  nebs, revefenacin  nebs, pulmicort  nebs, aformoterol nebs 01/09-01/10: continue pulmonary treatments and abx, final cultures and fungitell pending. Resp culture rare GPC chains. Echo EF 45-50%. Per ID anticipate dc Mon once completed cefepime  if cx remain neg  01/11-01/12: normal resp flora on resp culture. Fungitell still pending. Running cefepime .  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  Despite his many comorbidities he continues to smoke 1 to 2 packs of cigarettes per day.  No concerns on Echo Resp culture - likely strep, normal flora, no staph or pseudomonas  Per ID - Tx MAC amikacin , ethambutol , azithromycin  and clofazamine. Follow w/ primary ID outpatient  Per Pulm -  albuterol  nebs prn, revefenacin  nebs, pulmicort  nebs bid, aformoterol nebs  Medrol  taper then can resume usual hydrocortisone   Guaifenesin  scheduled, tussionex prn  fungitell pending - ID to follow    Non-small cell lung cancer, right Cavitary mass/stable pulmonary nodules No acute issues suspected Follow-up with oncology   SIADH (syndrome of inappropriate ADH production) (HCC) Hyponatremia Stable Monitor BMP  Resume home salt tabs and follow w/ endocrinology   Anxiety/insomnia Chronic benzodiazepine use Resume home valium     Chronic adrenal insufficiency Medrol  taper then can resume usual hydrocortisone   Follow up with endocrinology    Benign essential hypertension Meds as below   Hypothyroidism, acquired Continue levothyroxine    Frequent PVCs Continue metoprolol    Tobacco use disorder Nicotine  patch            Discharge Instructions  Allergies as of 11/14/2023       Reactions   Lisinopril Rash   Varenicline  Rash        Medication List     STOP taking these medications    Breztri  Aerosphere 160-9-4.8 MCG/ACT Aero Generic drug: Budeson-Glycopyrrol-Formoterol    dronabinol  5 MG capsule Commonly known as: MARINOL    losartan 25 MG tablet Commonly known as: COZAAR   predniSONE  20 MG tablet Commonly known as: DELTASONE    rifampin  300 MG capsule Commonly known as: RIFADIN        TAKE these medications    acetaminophen  500 MG tablet Commonly known as: TYLENOL  Take 500 mg by mouth daily as needed.   albuterol  108 (90 Base) MCG/ACT inhaler Commonly known as: VENTOLIN  HFA INHALE 2 PUFFS BY MOUTH EVERY 4 HOURS AS NEEDED FOR WHEEZE OR FOR SHORTNESS OF BREATH   albuterol  (2.5 MG/3ML) 0.083% nebulizer solution Commonly known as: PROVENTIL  TAKE 3 MLS (2.5 MG TOTAL) BY NEBULIZATION 4 (FOUR) TIMES DAILY AS NEEDED FOR WHEEZING OR SHORTNESS OF BREATH.   amikacin  1 GM/4ML Soln injection Commonly known as: AMIKIN  Inject into the muscle daily.    arformoterol  15 MCG/2ML Nebu Commonly known as: BROVANA  Take 2 mLs (15 mcg total) by nebulization 2 (two) times daily.   atorvastatin  10 MG tablet Commonly known as: LIPITOR Take 10 mg by mouth daily.   azithromycin  500 MG tablet Commonly known as: ZITHROMAX  TAKE 1 TABLET (500 MG TOTAL) BY MOUTH DAILY.   budesonide  0.5 MG/2ML nebulizer solution Commonly known as: PULMICORT  Take 2 mLs (0.5 mg total) by nebulization 2 (two) times daily.   chlorpheniramine-HYDROcodone  10-8 MG/5ML Commonly known as: TUSSIONEX Take 5 mLs by mouth every 12 (twelve) hours as needed for up to 5 days for cough.   clofazimine  50 mg Caps capsule (for compassionate use) Take 100 mg by mouth daily with breakfast.   clotrimazole  10 MG troche Commonly known as: MYCELEX  Take 10 mg by mouth 3 (three) times daily.   diazepam  5 MG tablet Commonly known as: VALIUM  Take 5 mg by mouth at bedtime as needed.   ethambutol  400 MG tablet Commonly known as: MYAMBUTOL  Take 3 tablets (1,200 mg total) by mouth daily.   ferrous sulfate  325 (65 FE) MG tablet Take 325 mg by mouth at bedtime.  folic acid  1 MG tablet Commonly known as: FOLVITE  Take 1 mg by mouth daily.   guaiFENesin  600 MG 12 hr tablet Commonly known as: MUCINEX  Take 1 tablet (600 mg total) by mouth 2 (two) times daily.   HYDROcodone -acetaminophen  5-325 MG tablet Commonly known as: NORCO/VICODIN Take 1 tablet by mouth every 6 (six) hours as needed for up to 5 days for moderate pain (pain score 4-6).   hydrocortisone  10 MG tablet Commonly known as: CORTEF  HOLD UNTIL COMPLETION OF MEDROL  TAPER. Take two tablets in the morning. Take one tablet in the afternoon (between 2-4pm). What changed: See the new instructions.   levothyroxine  175 MCG tablet Commonly known as: SYNTHROID  Take 175 mcg by mouth daily before breakfast.   lidocaine -prilocaine  cream Commonly known as: EMLA  APPLY 1 APPLICATION TOPICALLY AS NEEDED.   magnesium  oxide 400 MG  tablet Commonly known as: MAG-OX Take 800 mg by mouth 2 (two) times daily.   melatonin 3 MG Tabs tablet Take 3 mg by mouth at bedtime as needed.   methylPREDNISolone  4 MG Tbpk tablet Commonly known as: MEDROL  DOSEPAK 6-day oral taper as directed then restart hydrocortisone  as usual   metoprolol  succinate 25 MG 24 hr tablet Commonly known as: TOPROL -XL Take 12.5-25 mg by mouth daily as needed (Acute High Blood Pressure).   nicotine  21 mg/24hr patch Commonly known as: NICODERM CQ  - dosed in mg/24 hours Place 21 mg onto the skin daily.   nitroGLYCERIN  0.4 MG SL tablet Commonly known as: NITROSTAT  Place under the tongue.   OXYGEN Inhale 2 L into the lungs at bedtime.   pantoprazole  40 MG tablet Commonly known as: PROTONIX  Take 1 tablet (40 mg total) by mouth 2 (two) times daily before meals   revefenacin  175 MCG/3ML nebulizer solution Commonly known as: YUPELRI  Take 3 mLs (175 mcg total) by nebulization daily. Start taking on: November 15, 2023   sodium chloride  1 g tablet Take 5 tablets (5 g total) by mouth 3 (three) times daily with meals.   tadalafil  5 MG tablet Commonly known as: CIALIS  Take 1 tablet (5 mg total) by mouth daily as needed for erectile dysfunction.         Follow-up Information     Sadie Manna, MD Follow up.   Specialty: Internal Medicine Why: Hospital follow up. Contact information: [address] [place] [address] [phone]         Tamea Dedra CROME, MD. Go in 1 week(s).   Specialty: Pulmonary Disease Why: appointment w/ Dr Tamea 10 am Contact information: [address] [place] [address] [phone]                 Allergies  Allergen Reactions   Lisinopril Rash   Varenicline  Rash     Subjective: persistent cough better than few days ago, stable since yesterdya, SOB about baseline / minimal, no chest pain, no fever/chils   Discharge Exam: BP (!)  129/58 (BP Location: Left Arm)   Pulse 81   Temp 98 F (36.7 C)   Resp 15   Ht 6' (1.829 m)   Wt 77.1 kg   SpO2 95%   BMI 23.06 kg/m  General: Pt is alert, awake, not in acute distress Cardiovascular: RRR, S1/S2 +, no rubs, no gallops Respiratory: +wheezes and reduced breath sounds bilaterally  Abdominal: Soft, NT, ND, bowel sounds + Extremities: no edema, no cyanosis     The results of significant diagnostics from this hospitalization (including imaging,  microbiology, ancillary and laboratory) are listed below for reference.     Microbiology: Recent Results (from the past 240 hours)  Blood culture (routine x 2)     Status: None   Collection Time: 11/08/23  5:10 PM   Specimen: BLOOD  Result Value Ref Range Status   Specimen Description BLOOD BLOOD LEFT HAND  Final   Special Requests   Final    BOTTLES DRAWN AEROBIC AND ANAEROBIC Blood Culture adequate volume   Culture   Final    NO GROWTH 5 DAYS Performed at [facility], [address] [place], [place] [address]    Report Status 11/13/2023 FINAL  Final  Blood culture (routine x 2)     Status: None   Collection Time: 11/08/23  5:15 PM   Specimen: BLOOD  Result Value Ref Range Status   Specimen Description BLOOD BLOOD RIGHT ARM  Final   Special Requests   Final    BOTTLES DRAWN AEROBIC AND ANAEROBIC Blood Culture results may not be optimal due to an inadequate volume of blood received in culture bottles   Culture   Final    NO GROWTH 5 DAYS Performed at [facility], [address], [place], [place] [address]    Report Status 11/13/2023 FINAL  Final  Respiratory (~20 pathogens) panel by PCR     Status: None   Collection Time: 11/09/23 10:22 AM   Specimen: Expectorated Sputum; Respiratory  Result Value Ref Range Status   Adenovirus NOT DETECTED NOT DETECTED Final   Coronavirus 229E NOT DETECTED NOT DETECTED Final    Comment: (NOTE) The Coronavirus on the Respiratory Panel, DOES NOT test for  the novel  Coronavirus (2019 nCoV)    Coronavirus HKU1 NOT DETECTED NOT DETECTED Final   Coronavirus NL63 NOT DETECTED NOT DETECTED Final   Coronavirus OC43 NOT DETECTED NOT DETECTED Final   Metapneumovirus NOT DETECTED NOT DETECTED Final   Rhinovirus / Enterovirus NOT DETECTED NOT DETECTED Final   Influenza A NOT DETECTED NOT DETECTED Final   Influenza B NOT DETECTED NOT DETECTED Final   Parainfluenza Virus 1 NOT DETECTED NOT DETECTED Final   Parainfluenza Virus 2 NOT DETECTED NOT DETECTED Final   Parainfluenza Virus 3 NOT DETECTED NOT DETECTED Final   Parainfluenza Virus 4 NOT DETECTED NOT DETECTED Final   Respiratory Syncytial Virus NOT DETECTED NOT DETECTED Final   Bordetella pertussis NOT DETECTED NOT DETECTED Final   Bordetella Parapertussis NOT DETECTED NOT DETECTED Final   Chlamydophila pneumoniae NOT DETECTED NOT DETECTED Final   Mycoplasma pneumoniae NOT DETECTED NOT DETECTED Final    Comment: Performed at [facility], [address], [place], [place] [address]  Expectorated Sputum Assessment w Gram Stain, Rflx to Resp Cult     Status: None   Collection Time: 11/09/23 10:38 AM   Specimen: Sputum  Result Value Ref Range Status   Specimen Description SPUTUM  Final   Special Requests Normal  Final   Sputum evaluation   Final    THIS SPECIMEN IS ACCEPTABLE FOR SPUTUM CULTURE Performed at [facility], [address], [place], [place] [address]    Report Status 11/09/2023 FINAL  Final  Culture, Respiratory w Gram Stain     Status: None   Collection Time: 11/09/23 10:38 AM   Specimen: SPU  Result Value Ref Range Status   Specimen Description   Final    SPUTUM Performed at [facility], [address] [place], [place] [address]  Special Requests   Final    Normal Reflexed from [id number] Performed at [facility], [address] [place], [place] [address]    Gram Stain NO WBC SEEN RARE GRAM POSITIVE COCCI IN CHAINS   Final    Culture   Final    FEW Normal respiratory flora-no Staph aureus or Pseudomonas seen Performed at [facility], [address], [place], [place] [address]    Report Status 11/11/2023 FINAL  Final  MRSA Next Gen by PCR, Nasal     Status: None   Collection Time: 11/09/23 10:40 AM   Specimen: Expectorated Sputum; Nasal Swab  Result Value Ref Range Status   MRSA by PCR Next Gen NOT DETECTED NOT DETECTED Final    Comment: (NOTE) The GeneXpert MRSA Assay (FDA approved for NASAL specimens only), is one component of a comprehensive MRSA colonization surveillance program. It is not intended to diagnose MRSA infection nor to guide or monitor treatment for MRSA infections. Test performance is not FDA approved in patients less than 26 years old. Performed at [facility], [address] [place], [place] [address]      Labs: BNP (last 3 results) Recent Labs    11/08/23 1711  BNP 83.4   Basic Metabolic Panel: Recent Labs  Lab 11/10/23 0405 11/11/23 0605 11/12/23 0557 11/13/23 0536 11/14/23 0614  NA 135 135 133* 130* 131*  K 3.9 4.2 4.3 4.2 4.5  CL 95* 92* 94* 89* 92*  CO2 29 34* 34* 30 31  GLUCOSE 128* 109* 125* 136* 132*  BUN 12 14 13 12 13   CREATININE 0.70 0.78 0.73 0.66 0.65  CALCIUM  8.5* 8.5* 8.3* 8.4* 8.2*   Liver Function Tests: Recent Labs  Lab 11/08/23 1710  AST 19  ALT 17  ALKPHOS 77  BILITOT 0.6  PROT 6.3*  ALBUMIN 3.8   No results for input(s): LIPASE, AMYLASE in the last 168 hours. No results for input(s): AMMONIA in the last 168 hours. CBC: Recent Labs  Lab 11/08/23 1710 11/09/23 0555 11/12/23 0557  WBC 9.7 7.5 8.7  NEUTROABS 9.2*  --   --   HGB 14.3 13.3 13.2  HCT 41.3 38.9* 39.1  MCV 92.0 91.1 93.3  PLT 213 204 180   Cardiac Enzymes: No results for input(s): CKTOTAL, CKMB, CKMBINDEX, TROPONINI in the last 168 hours. BNP: Invalid input(s): POCBNP CBG: No results for input(s): GLUCAP in the last 168  hours. D-Dimer No results for input(s): DDIMER in the last 72 hours. Hgb A1c No results for input(s): HGBA1C in the last 72 hours. Lipid Profile No results for input(s): CHOL, HDL, LDLCALC, TRIG, CHOLHDL, LDLDIRECT in the last 72 hours. Thyroid  function studies No results for input(s): TSH, T4TOTAL, T3FREE, THYROIDAB in the last 72 hours.  Invalid input(s): FREET3 Anemia work up No results for input(s): VITAMINB12, FOLATE, FERRITIN, TIBC, IRON , RETICCTPCT in the last 72 hours. Urinalysis    Component Value Date/Time   COLORURINE YELLOW (A) 02/09/2023 2128   APPEARANCEUR CLEAR (A) 02/09/2023 2128   APPEARANCEUR Clear 08/15/2020 0951   LABSPEC 1.013 02/09/2023 2128   LABSPEC 1.004 01/26/2012 0944   PHURINE 6.0 02/09/2023 2128   GLUCOSEU NEGATIVE 02/09/2023 2128   GLUCOSEU Negative 01/26/2012 0944   HGBUR NEGATIVE 02/09/2023 2128   BILIRUBINUR NEGATIVE 02/09/2023 2128   BILIRUBINUR Negative 08/15/2020 0951   BILIRUBINUR Negative 01/26/2012 0944   KETONESUR NEGATIVE 02/09/2023 2128   PROTEINUR NEGATIVE 02/09/2023 2128   NITRITE NEGATIVE 02/09/2023 2128   LEUKOCYTESUR NEGATIVE  02/09/2023 2128   LEUKOCYTESUR Negative 01/26/2012 0944   Sepsis Labs Recent Labs  Lab 11/08/23 1710 11/09/23 0555 11/12/23 0557  WBC 9.7 7.5 8.7   Microbiology Recent Results (from the past 240 hours)  Blood culture (routine x 2)     Status: None   Collection Time: 11/08/23  5:10 PM   Specimen: BLOOD  Result Value Ref Range Status   Specimen Description BLOOD BLOOD LEFT HAND  Final   Special Requests   Final    BOTTLES DRAWN AEROBIC AND ANAEROBIC Blood Culture adequate volume   Culture   Final    NO GROWTH 5 DAYS Performed at [facility], [address] [place], [place] [address]    Report Status 11/13/2023 FINAL  Final  Blood culture (routine x 2)     Status: None   Collection Time: 11/08/23  5:15 PM   Specimen: BLOOD  Result Value Ref  Range Status   Specimen Description BLOOD BLOOD RIGHT ARM  Final   Special Requests   Final    BOTTLES DRAWN AEROBIC AND ANAEROBIC Blood Culture results may not be optimal due to an inadequate volume of blood received in culture bottles   Culture   Final    NO GROWTH 5 DAYS Performed at [facility], [address] [place], [place] [address]    Report Status 11/13/2023 FINAL  Final  Respiratory (~20 pathogens) panel by PCR     Status: None   Collection Time: 11/09/23 10:22 AM   Specimen: Expectorated Sputum; Respiratory  Result Value Ref Range Status   Adenovirus NOT DETECTED NOT DETECTED Final   Coronavirus 229E NOT DETECTED NOT DETECTED Final    Comment: (NOTE) The Coronavirus on the Respiratory Panel, DOES NOT test for the novel  Coronavirus (2019 nCoV)    Coronavirus HKU1 NOT DETECTED NOT DETECTED Final   Coronavirus NL63 NOT DETECTED NOT DETECTED Final   Coronavirus OC43 NOT DETECTED NOT DETECTED Final   Metapneumovirus NOT DETECTED NOT DETECTED Final   Rhinovirus / Enterovirus NOT DETECTED NOT DETECTED Final   Influenza A NOT DETECTED NOT DETECTED Final   Influenza B NOT DETECTED NOT DETECTED Final   Parainfluenza Virus 1 NOT DETECTED NOT DETECTED Final   Parainfluenza Virus 2 NOT DETECTED NOT DETECTED Final   Parainfluenza Virus 3 NOT DETECTED NOT DETECTED Final   Parainfluenza Virus 4 NOT DETECTED NOT DETECTED Final   Respiratory Syncytial Virus NOT DETECTED NOT DETECTED Final   Bordetella pertussis NOT DETECTED NOT DETECTED Final   Bordetella Parapertussis NOT DETECTED NOT DETECTED Final   Chlamydophila pneumoniae NOT DETECTED NOT DETECTED Final   Mycoplasma pneumoniae NOT DETECTED NOT DETECTED Final    Comment: Performed at [facility], [address], [place], [place] [address]  Expectorated Sputum Assessment w Gram Stain, Rflx to Resp Cult     Status: None   Collection Time: 11/09/23 10:38 AM   Specimen: Sputum  Result Value Ref Range Status    Specimen Description SPUTUM  Final   Special Requests Normal  Final   Sputum evaluation   Final    THIS SPECIMEN IS ACCEPTABLE FOR SPUTUM CULTURE Performed at [facility], [address], [place], [place] [address]    Report Status 11/09/2023 FINAL  Final  Culture, Respiratory w Gram Stain     Status: None   Collection Time: 11/09/23 10:38 AM   Specimen: SPU  Result Value Ref Range Status   Specimen Description   Final  SPUTUM Performed at [facility], [address] [place], [place] [address]    Special Requests   Final    Normal Reflexed from [id number] Performed at [facility], [address] [place], [place] [address]    Gram Stain NO WBC SEEN RARE GRAM POSITIVE COCCI IN CHAINS   Final   Culture   Final    FEW Normal respiratory flora-no Staph aureus or Pseudomonas seen Performed at [facility], [address], [place], [place] [address]    Report Status 11/11/2023 FINAL  Final  MRSA Next Gen by PCR, Nasal     Status: None   Collection Time: 11/09/23 10:40 AM   Specimen: Expectorated Sputum; Nasal Swab  Result Value Ref Range Status   MRSA by PCR Next Gen NOT DETECTED NOT DETECTED Final    Comment: (NOTE) The GeneXpert MRSA Assay (FDA approved for NASAL specimens only), is one component of a comprehensive MRSA colonization surveillance program. It is not intended to diagnose MRSA infection nor to guide or monitor treatment for MRSA infections. Test performance is not FDA approved in patients less than 3 years old. Performed at [facility], [address] [place], [place] [address]    Imaging ECHOCARDIOGRAM LIMITED Result Date: 11/11/2023    ECHOCARDIOGRAM LIMITED REPORT   Patient Name:   Paul Carpenter Date of Exam: 11/10/2023 Medical Rec #:  [id number]        Height:       72.0 in Accession #:    [id number]       Weight:       170.0 lb Date of Birth:  1956-05-12        BSA:          1.988 m Patient Age:    67 years          BP:           148/82 mmHg Patient Gender: M                HR:           94 bpm. Exam Location:  [facility] Procedure: Limited Echo and Intracardiac Opacification Agent Indications:     Cardiomyopathy  History:         Patient has prior history of Echocardiogram examinations, most                  recent 11/10/2022. Cardiomyopathy and CHF, CAD, COPD,                  Arrythmias:PVC, Signs/Symptoms:Shortness of Breath; Risk                  Factors:Hypertension, Current Smoker, Dyslipidemia and Sleep                  Apnea. Lung CA.  Sonographer:     Naomie Reef Referring Phys:  [id number] CARMEN L TAMEA Diagnosing Phys: Marsa Dooms MD  Sonographer Comments: Technically difficult study due to poor echo windows. Image acquisition challenging due to COPD and Image acquisition challenging due to respiratory motion. IMPRESSIONS  1. Left ventricular ejection fraction, by estimation, is 45 to 50%. The left ventricle has mildly decreased function. The left ventricle has no regional wall motion abnormalities. Left ventricular diastolic parameters are indeterminate.  2. Right ventricular systolic function is normal. The right ventricular size is normal.  3. The mitral valve is normal in structure. Mild mitral valve regurgitation. No evidence of mitral stenosis.  4. The aortic valve is normal in structure. Aortic valve regurgitation is not visualized. No aortic stenosis is present.  5. The inferior vena cava is normal in size with greater than 50% respiratory variability, suggesting right atrial pressure of 3 mmHg. FINDINGS  Left Ventricle: Left ventricular ejection fraction, by estimation, is 45 to 50%. The left ventricle has mildly decreased function. The left ventricle has no regional wall motion abnormalities. Definity  contrast agent was given IV to delineate the left ventricular endocardial borders. The left ventricular internal cavity size was normal in size. There is no left ventricular hypertrophy. Left  ventricular diastolic parameters are indeterminate. Right Ventricle: The right ventricular size is normal. No increase in right ventricular wall thickness. Right ventricular systolic function is normal. Left Atrium: Left atrial size was normal in size. Right Atrium: Right atrial size was normal in size. Pericardium: There is no evidence of pericardial effusion. Mitral Valve: The mitral valve is normal in structure. Mild mitral valve regurgitation. No evidence of mitral valve stenosis. Tricuspid Valve: The tricuspid valve is normal in structure. Tricuspid valve regurgitation is trivial. No evidence of tricuspid stenosis. Aortic Valve: The aortic valve is normal in structure. Aortic valve regurgitation is not visualized. No aortic stenosis is present. Pulmonic Valve: The pulmonic valve was normal in structure. Pulmonic valve regurgitation is not visualized. No evidence of pulmonic stenosis. Aorta: The aortic root is normal in size and structure. Venous: The inferior vena cava is normal in size with greater than 50% respiratory variability, suggesting right atrial pressure of 3 mmHg. IAS/Shunts: No atrial level shunt detected by color flow Doppler. LEFT VENTRICLE PLAX 2D LVIDd:         4.70 cm LVIDs:         3.50 cm LV PW:         1.20 cm LV IVS:        1.20 cm LVOT diam:     2.20 cm LVOT Area:     3.80 cm  LV Volumes (MOD) LV vol d, MOD A2C: 57.3 ml LV vol d, MOD A4C: 69.5 ml LV vol s, MOD A2C: 35.5 ml LV vol s, MOD A4C: 38.1 ml LV SV MOD A2C:     21.8 ml LV SV MOD A4C:     69.5 ml LV SV MOD BP:      27.5 ml RIGHT VENTRICLE RV Basal diam:  3.80 cm LEFT ATRIUM         Index LA diam:    3.40 cm 1.71 cm/m   AORTA Ao Root diam: 4.00 cm  SHUNTS Systemic Diam: 2.20 cm Marsa Dooms MD Electronically signed by Marsa Dooms MD Signature Date/Time: 11/11/2023/11:41:38 AM    Final       Time coordinating discharge: over 30 minutes  SIGNED:  Rahn Lacuesta DO [facility]

## 2023-11-14 NOTE — Progress Notes (Signed)
 Mobility Specialist - Progress Note   11/14/23 1004  Mobility  Activity Ambulated independently in hallway  Level of Assistance Modified independent, requires aide device or extra time  Assistive Device None  Distance Ambulated (ft) 160 ft  Activity Response Tolerated well  Mobility visit 1 Mobility  Mobility Specialist Start Time (ACUTE ONLY) 0931  Mobility Specialist Stop Time (ACUTE ONLY) 0940  Mobility Specialist Time Calculation (min) (ACUTE ONLY) 9 min   Pt sitting EOB upon arrival, utilizing RA. Pt motivated and agreeable to OOB amb in the hallway this date. Pt expressed feeling weak and tried prior to amb, coughing noted throughout session. Pt STS and amb one lap around the NS ModI-- slow and steady gait. Pt returned to the room, left seated EOB with needs within reach. RN notified.   America Silvan Mobility Specialist 11/14/23 10:33 AM

## 2023-11-14 NOTE — Progress Notes (Signed)
 NAME:  Paul Carpenter, MRN:  [id number], DOB:  [birth year], LOS: 6 ADMISSION DATE:  11/08/2023, CONSULTATION DATE: 09 November 2023 REFERRING MD: Paul Rozella Ban, MD, CHIEF COMPLAINT: Follow-up requested  History of Present Illness:  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  For the details of that visit please refer to that note.  The patient is also followed at [facility] at the [facility] (Dr. India Carpenter) for management of his resistant MAC.  On evaluation 06 Nov 2022 the patient was noted to be in moderate respiratory distress with significant bronchospasm noted.  He had failed outpatient therapy for COPD exacerbation (prednisone  taper and antibiotics ordered by [facility]).  Recently he was started on clofazimine  on compassionate use protocol for his MAC.  He is also on azithromycin , amikacin  and ethambutol .  The patient was referred for evaluation to ED for subsequent admission.  CT angio chest performed yesterday did not show pulmonary emboli.  The patient does have SEVERE emphysema/COPD noted on the scan.  He has the aforementioned cavitary process of his right upper lobe that is unchanged from prior.  There are lung nodules noted that are related to his MAC these do show waxing and waning pattern of size change.  Overall the nodules are stable.  The patient has been in progressive decline for over a year.  Despite his many comorbidities he continues to smoke 1 to 2 packs of cigarettes per day.  I was asked to follow-up with the patient at the request of Dr. Ban, I have discussed the case thoroughly with him.  On reevaluation today, the  patient appears to be somewhat improved with regards to work of breathing.  He continues to have significant cough and dyspnea.  I have reviewed his current medications thoroughly.   Pertinent  Medical History  - Non-small cell carcinoma stage IIIa Recurrence in 2019 status post chemo/radiation (right upper lobe) followed by Durvalumab  therapy for 1 year completed in 2020 - Stage III COPD - Nocturnal hypoxemia due to emphysema on nocturnal oxygen - Ischemic cardiomyopathy - Resistant Mycobacterium Avium Complex infection - Multiple lung nodules - Cavitary process right upper lobe progressive since 2020 (post radiation/infection) - Secondary polycythemia - Anxiety and depression - Tobacco dependency due to cigarettes - History of alcohol abuse - SIADH with attendant hyponatremia  Significant Hospital Events: Including procedures, antibiotic start and stop dates in addition to other pertinent events   01/07 admitted for management of refractory COPD exacerbation, CT angio chest no PE 01/08 pulmonary asked to follow-up 01/09 echocardiogram performed, report pending 01/10 echocardiogram final read LVEF 45% 01/13 continued improvement over the weekend, plan for discharge today  Interim History / Subjective:  Feels better overall today.  Less work of breathing.  Feels nebulizers are helping.  Objective   Blood pressure (!) 129/58, pulse 81, temperature 98 F (36.7 C), resp. rate 15, height 6' (1.829 m), weight 77.1 kg, SpO2 95%.    SpO2: 95 % O2 Flow Rate (L/min): 2 L/min     Intake/Output Summary (Last 24 hours) at 11/14/2023 1145 Last data filed at 11/13/2023 1547 Gross per 24 hour  Intake 240 ml  Output 550 ml  Net -310 ml   Filed Weights   11/08/23 1654  Weight: 77.1 kg    Examination: GENERAL: Chronically ill-appearing Carpenter, mild tachypnea.  Resting in bed.  No conversational dyspnea.  Awake and alert.   HEAD: Normocephalic, atraumatic.  EYES: Pupils equal, round,  reactive to light.  No scleral icterus.  MOUTH: Oral mucosa moist.  No thrush. NECK: Supple. No thyromegaly. Trachea midline. No JVD.  No adenopathy. PULMONARY: Good air entry bilaterally. Amphoric sounds on the right upper lung zone.  Scattered rhonchi noted.  No wheezes noted. CARDIOVASCULAR: S1 and S2. Regular rate and rhythm.  No rubs, murmurs or gallops heard. ABDOMEN: Benign. MUSCULOSKELETAL: No joint deformity, no clubbing, no edema.  NEUROLOGIC: No overt focal deficit. Speech is fluent. SKIN: Intact,warm,dry. PSYCH: Depressed mood and flat affect.    Resolved Hospital Problem list     Assessment & Plan:  COPD exacerbation, refractory Hx: Stage III COPD, resistant MAC infection query superimposed infection Continue nebulization treatments as outpatient: Brovana /Pulmicort  twice a day via neb Yupelri  once a day via neb Albuterol  as needed Medrol  Dosepak for taper Continue antibiotics for MAC  Acute on chronic respiratory failure with hypoxia Oxygen supplementation to keep oxygen saturations between 90 to 92% at 2 L/min Pulmonary hygiene Acapella flutter valve Paul Carpenter) as per home use  Resistant MAC infection Continue therapy Ethambutol , amikacin , azithromycin , clofazimine   Continue follow-up at [facility]  Ischemic cardiomyopathy Limited echocardiogram, eval LVEF LVEF stable at 45%  Non-small cell lung cancer No obvious evidence of recurrence Residual cavitary lesion postradiation therapy  Tobacco dependence due to cigarettes Has not been motivated to quit Discussed importance of discontinuation of smoking Continue nicotine  replacement therapy Abstinence from cigarettes made the most impact  Adrenal insufficiency Resume hydrocortisone  once Medrol  Dosepak completed  End-of-life discussions Palliative Care involved   Best Practice (right click and Reselect all SmartList Selections daily)   Diet/type: Regular consistency (see orders) DVT  prophylaxis LMWH Pressure ulcer(s): N/A GI prophylaxis: PPI Lines: Central line implanted port on right (Port-A-Cath) Foley:  N/A Code Status:  full code continue discussions to establish goals of care Last date of multidisciplinary goals of care discussion [11/08/2023 discussed with patient, wife and son during office visit.  11/09/2023 discussed with patient and daughter who was present at bedside]  Labs   CBC: Recent Labs  Lab 11/08/23 1710 11/09/23 0555 11/12/23 0557  WBC 9.7 7.5 8.7  NEUTROABS 9.2*  --   --   HGB 14.3 13.3 13.2  HCT 41.3 38.9* 39.1  MCV 92.0 91.1 93.3  PLT 213 204 180    Basic Metabolic Panel: Recent Labs  Lab 11/10/23 0405 11/11/23 0605 11/12/23 0557 11/13/23 0536 11/14/23 0614  NA 135 135 133* 130* 131*  K 3.9 4.2 4.3 4.2 4.5  CL 95* 92* 94* 89* 92*  CO2 29 34* 34* 30 31  GLUCOSE 128* 109* 125* 136* 132*  BUN 12 14 13 12 13   CREATININE 0.70 0.78 0.73 0.66 0.65  CALCIUM  8.5* 8.5* 8.3* 8.4* 8.2*   GFR: Estimated Creatinine Clearance: 99.1 mL/min (by C-G formula based on SCr of 0.65 mg/dL). Recent Labs  Lab 11/08/23 1710 11/08/23 1711 11/08/23 2141 11/09/23 0555 11/12/23 0557  PROCALCITON 0.35  --   --   --   --   WBC 9.7  --   --  7.5 8.7  LATICACIDVEN  --  1.3 2.1*  --   --     Liver Function Tests: Recent Labs  Lab 11/08/23 1710  AST 19  ALT 17  ALKPHOS 77  BILITOT 0.6  PROT 6.3*  ALBUMIN 3.8   No results for input(s): LIPASE, AMYLASE in the last 168 hours. No results for input(s): AMMONIA in the last 168 hours.  ABG No results found for: PHART, PCO2ART, PO2ART, HCO3, TCO2, ACIDBASEDEF, O2SAT   Coagulation Profile: No results for input(s): INR, PROTIME in the last 168 hours.  Cardiac Enzymes: No results for input(s): CKTOTAL, CKMB, CKMBINDEX, TROPONINI in the last 168 hours.  HbA1C: No results found for: HGBA1C  CBG: No results for input(s): GLUCAP in the last 168  hours.  Review of Systems:   A 10 point review of systems was performed and it is as noted above otherwise negative.  Past Medical History:  He,  has a past medical history of Anginal pain (HCC), Anxiety, Asthma, Chest pain, CHF (congestive heart failure) (HCC), Chicken pox, Complication of anesthesia, COPD (chronic obstructive pulmonary disease) (HCC), Coronary artery disease, Cough, Dysrhythmia, GERD (gastroesophageal reflux disease), Hematochezia, Hemorrhoids, History of chickenpox, History of colon polyps, History of Helicobacter pylori infection, Hoarseness, Hypertension, Lung cancer (HCC) (05/2016), Migraines, OSA (obstructive sleep apnea), Personal history of tobacco use, presenting hazards to health (03/05/2016), Pneumonia, Raynaud disease, Raynaud disease, Raynaud's disease, Rotator cuff tear, Shortness of breath dyspnea, Sleep apnea, and Ulcer (traumatic) of oral mucosa.   Surgical History:   Past Surgical History:  Procedure Laterality Date   BACK SURGERY     cervical fusion x 2   CARDIAC CATHETERIZATION     CERVICAL DISCECTOMY     x 2   COLONOSCOPY     COLONOSCOPY N/A 07/25/2015   Procedure: COLONOSCOPY;  Surgeon: Gladis RAYMOND Mariner, MD;  Location: [facility] ENDOSCOPY;  Service: Endoscopy;  Laterality: N/A;   COLONOSCOPY WITH PROPOFOL  N/A 10/04/2017   Procedure: COLONOSCOPY WITH PROPOFOL ;  Surgeon: Mariner Gladis RAYMOND, MD;  Location: [facility] ENDOSCOPY;  Service: Endoscopy;  Laterality: N/A;   COLONOSCOPY WITH PROPOFOL  N/A 07/10/2020   Procedure: COLONOSCOPY WITH PROPOFOL ;  Surgeon: Toledo, Ladell POUR, MD;  Location: [facility] ENDOSCOPY;  Service: Gastroenterology;  Laterality: N/A;   ELECTROMAGNETIC NAVIGATION BROCHOSCOPY Left 06/28/2016   Procedure: ELECTROMAGNETIC NAVIGATION BRONCHOSCOPY;  Surgeon: Jorie Cha, MD;  Location: [facility] ORS;  Service: Cardiopulmonary;  Laterality: Left;   ENDOBRONCHIAL ULTRASOUND N/A 04/11/2018   Procedure: ENDOBRONCHIAL ULTRASOUND;  Surgeon: Isaiah Scrivener, MD;   Location: [facility] ORS;  Service: Cardiopulmonary;  Laterality: N/A;   ESOPHAGOGASTRODUODENOSCOPY N/A 07/25/2015   Procedure: ESOPHAGOGASTRODUODENOSCOPY (EGD);  Surgeon: Gladis RAYMOND Mariner, MD;  Location: [facility] ENDOSCOPY;  Service: Endoscopy;  Laterality: N/A;   ESOPHAGOGASTRODUODENOSCOPY (EGD) WITH PROPOFOL  N/A 07/10/2020   Procedure: ESOPHAGOGASTRODUODENOSCOPY (EGD) WITH PROPOFOL ;  Surgeon: Toledo, Ladell POUR, MD;  Location: [facility] ENDOSCOPY;  Service: Gastroenterology;  Laterality: N/A;   NASAL SINUS SURGERY     x 2    PORTA CATH INSERTION N/A 04/24/2018   Procedure: PORTA CATH INSERTION;  Surgeon: Marea Selinda RAMAN, MD;  Location: [facility] INVASIVE CV LAB;  Service: Cardiovascular;  Laterality: N/A;   rotator cuff surgery Right    07/2016   SEPTOPLASTY     SKIN GRAFT       Social History:   reports that he has been smoking cigarettes. He has a 150 pack-year smoking history. He has been exposed to tobacco smoke. He has never used smokeless tobacco. He reports that he does not currently use alcohol after a past usage of about 2.0 standard drinks of alcohol per week. He reports that he does not use drugs.  Family History:  His family history includes Heart attack (age of onset: 38) in his maternal grandfather; Heart disease in his father and paternal grandmother; Prostate cancer in his father. There is no history of Kidney cancer, Bladder Cancer, or Other.   Allergies Allergies  Allergen Reactions   Lisinopril Rash   Varenicline  Rash     Home Medications  Prior to Admission medications   Medication Sig Start Date End Date Taking? Authorizing Provider  acetaminophen  (TYLENOL ) 500 MG tablet Take 500 mg by mouth daily as needed.   Yes [provider]  albuterol  (PROVENTIL ) (2.5 MG/3ML) 0.083% nebulizer solution TAKE 3 MLS (2.5 MG TOTAL) BY NEBULIZATION 4 (FOUR) TIMES DAILY AS NEEDED FOR WHEEZING OR SHORTNESS OF BREATH. 08/31/23 08/30/24 Yes Tamea Dedra CROME, MD  albuterol  (VENTOLIN  HFA) 108 (90  Base) MCG/ACT inhaler INHALE 2 PUFFS BY MOUTH EVERY 4 HOURS AS NEEDED FOR WHEEZE OR FOR SHORTNESS OF BREATH 07/05/23  Yes Tamea Dedra CROME, MD  amikacin  (AMIKIN ) 1 GM/4ML SOLN injection Inject into the muscle daily. 10/30/23  Yes [provider]  atorvastatin  (LIPITOR) 10 MG tablet Take 10 mg by mouth daily. 08/17/22  Yes [provider]  azithromycin  (ZITHROMAX ) 500 MG tablet TAKE 1 TABLET (500 MG TOTAL) BY MOUTH DAILY. 10/04/23  Yes Fayette Bodily, MD  BREZTRI  AEROSPHERE 160-9-4.8 MCG/ACT AERO INHALE 2 PUFFS INTO THE LUNGS IN THE MORNING AND AT BEDTIME. 05/20/23  Yes Tamea Dedra CROME, MD  clofazimine  50 mg CAPS capsule (for compassionate use) Take 100 mg by mouth daily with breakfast.   Yes [provider]  clotrimazole  (MYCELEX ) 10 MG troche Take 10 mg by mouth 3 (three) times daily.   Yes [provider]  diazepam  (VALIUM ) 5 MG tablet Take 5 mg by mouth at bedtime as needed. 11/30/21  Yes [provider]  ethambutol  (MYAMBUTOL ) 400 MG tablet Take 3 tablets (1,200 mg total) by mouth daily. 02/21/23  Yes Fayette Bodily, MD  ferrous sulfate  325 (65 FE) MG tablet Take 325 mg by mouth at bedtime.   Yes [provider]  folic acid  (FOLVITE ) 1 MG tablet Take 1 mg by mouth daily. 10/18/23 10/17/24 Yes [provider]  hydrocortisone  (CORTEF ) 10 MG tablet Take two tablets in the morning. Take one tablet in the afternoon (between 2-4pm). 03/03/23  Yes [provider]  levothyroxine  (SYNTHROID ) 175 MCG tablet Take 175 mcg by mouth daily before breakfast.   Yes [provider]  lidocaine -prilocaine  (EMLA ) cream APPLY 1 APPLICATION TOPICALLY AS NEEDED. 04/27/23  Yes [name], Evalene PARAS, MD  magnesium  oxide (MAG-OX) 400 MG tablet Take 800 mg by mouth 2 (two) times daily.   Yes [provider]  melatonin 3 MG TABS tablet Take 3 mg by mouth at bedtime as needed. 05/30/23  Yes [provider]  metoprolol   succinate (TOPROL -XL) 25 MG 24 hr tablet Take 12.5-25 mg by mouth daily as needed (Acute High Blood Pressure). 07/25/23 07/24/24 Yes [provider]  nicotine  (NICODERM CQ  - DOSED IN MG/24 HOURS) 21 mg/24hr patch Place 21 mg onto the skin daily. 06/13/23  Yes [provider]  nitroGLYCERIN  (NITROSTAT ) 0.4 MG SL tablet Place under the tongue. 04/07/21  Yes [provider]  pantoprazole  (PROTONIX ) 40 MG tablet Take 1 tablet (40 mg total) by mouth 2 (two) times daily before meals 02/15/22  Yes [provider]  predniSONE  (DELTASONE ) 20 MG tablet Take by mouth.  Take 2 tablets (40 mg total) by mouth daily for 3 days, THEN  1.5 tablets (30 mg total) daily for 3 days, THEN 1 tablet (20 mg total) daily for 3 days, THEN 0.5 tablets (10 mg total) daily for 3 days.    Instructions: Take 2 tablets (40 mg total) by mouth daily for 3 days, THEN 1.5 tablets (30 mg total) daily for 3 days, THEN 1 tablet (20 mg total) daily for 3 days, THEN 0.5 tablets (10 mg total) daily for 3 days. 11/01/23 11/13/23 Yes [provider]  sodium chloride  1 g tablet Take 1 tablet (1 g total) by mouth 3 (three) times daily with meals. Patient taking differently: Take 5 g by mouth 3 (three) times daily with meals. 09/22/21  Yes Krishnan, Sendil K, MD  tadalafil  (CIALIS ) 5 MG tablet Take 1 tablet (5 mg total) by mouth daily as needed for erectile dysfunction. 12/30/22  Yes McGowan, Clotilda A, PA-C  dronabinol  (MARINOL ) 5 MG capsule Take 1 capsule (5 mg total) by mouth 2 (two) times daily before lunch and supper. Patient not taking: Reported on 11/09/2023 11/23/22   Jacobo Evalene PARAS, MD  losartan (COZAAR) 25 MG tablet Take 25 mg by mouth daily. Patient not taking: Reported on 11/09/2023 07/25/23 07/24/24  [provider]  OXYGEN Inhale 2 L into the lungs at bedtime.    [provider]  rifampin  (RIFADIN ) 300 MG capsule Take 2 capsules (600 mg total) by mouth daily. Patient not taking:  Reported on 11/08/2023 02/21/23   Fayette Bodily, MD  ipratropium-albuterol  (DUONEB) 0.5-2.5 (3) MG/3ML SOLN Take 3 mLs by nebulization every 6 (six) hours as needed. 01/22/22 02/09/22  Borders, Fonda SAUNDERS, NP  levalbuterol  (XOPENEX  HFA) 45 MCG/ACT inhaler Inhale 1 puff into the lungs every 6 (six) hours as needed for wheezing. 11/19/21 02/09/22  Kasa, Kurian, MD    Scheduled Meds:  arformoterol   15 mcg Nebulization BID   atorvastatin   10 mg Oral Daily   azithromycin   500 mg Oral q1800   budesonide  (PULMICORT ) nebulizer solution  0.5 mg Nebulization BID   clofazimine   100 mg Oral Q breakfast   enoxaparin  (LOVENOX ) injection  40 mg Subcutaneous Q24H   ethambutol   1,200 mg Oral Daily   folic acid   1 mg Oral Daily   guaiFENesin   600 mg Oral BID   levothyroxine   175 mcg Oral Q0600   magnesium  oxide  600 mg Oral BID   methylPREDNISolone  (SOLU-MEDROL ) injection  40 mg Intravenous Q12H   nicotine   21 mg Transdermal Daily   pantoprazole   40 mg Oral Daily   revefenacin   175 mcg Nebulization Daily   sodium chloride   5 g Oral TID WC   Continuous Infusions:  amikacin  (AMIKIN ) 750 mg in dextrose  5 % 100 mL IVPB 750 mg (11/14/23 1033)   PRN Meds:.acetaminophen  **OR** acetaminophen , albuterol , chlorpheniramine-HYDROcodone , diazepam , guaiFENesin -dextromethorphan , HYDROcodone -acetaminophen , melatonin, metoprolol  succinate, ondansetron  **OR** ondansetron  (ZOFRAN ) IV   Level 2 follow up    Discussed with Dr. Laneta Blunt, DO.  Updated patient at bedside.  Establish follow-up appointment at [facility] on 20 January at 10 AM.  I spent 35 minutes of dedicated to the care of this patient on the date of this encounter to include pre-visit review of records, face-to-face time with the patient discussing conditions above, post visit ordering of medications/interventions, clinical documentation with the electronic health record, making appropriate referrals as documented, and  communicating necessary findings to members of the patients care team.   C. Leita Sanders, MD Advanced Bronchoscopy P[facility]    *This note was  generated using voice recognition software/Dragon and/or AI transcription program.  Despite best efforts to proofread, errors can occur which can change the meaning. Any transcriptional errors that result from this process are unintentional and may not be fully corrected at the time of dictation.

## 2023-11-15 LAB — AMIKACIN, PEAK: Amikacin Pk: 17.7 ug/mL — ABNORMAL LOW (ref 20.0–30.0)

## 2023-11-15 LAB — AMIKACIN, TROUGH: Amikacin Tr: <0.8 ug/mL — ABNORMAL LOW (ref 1.0–8.0)

## 2023-11-16 ENCOUNTER — Telehealth: Payer: Self-pay | Admitting: Pulmonary Disease

## 2023-11-16 LAB — FUNGITELL BETA-D-GLUCAN
Fungitell Value:: <31.25 pg/mL
Result Name:: NEGATIVE

## 2023-11-16 MED ORDER — HYDROCOD POLI-CHLORPHE POLI ER 10-8 MG/5ML PO SUER: 5.0000 mL | Freq: Two times a day (BID) | ORAL | 0 refills | Status: DC | PRN

## 2023-11-16 NOTE — Telephone Encounter (Signed)
 I have notified the patient. Nothing further needed.

## 2023-11-16 NOTE — Telephone Encounter (Signed)
 I sent a prescription to Tussionex to the pharmacy.  I do not prescribe anxiety medications.  He has been getting his Valium  through his primary care physician Dr. Kevan Peers I recommend that he touch base with Dr. Kevan Peers.

## 2023-11-16 NOTE — Telephone Encounter (Signed)
 Pt is asking for a refill on the tussenix to get thru until his fu on Monday. Also asking about a possible anxiety med?

## 2023-11-21 ENCOUNTER — Encounter: Payer: Self-pay | Admitting: Pulmonary Disease

## 2023-11-21 ENCOUNTER — Ambulatory Visit: Payer: Medicare Other | Admitting: Pulmonary Disease

## 2023-11-21 VITALS — BP 132/82 | HR 89 | Temp 97.7°F | Ht 72.0 in | Wt 174.8 lb

## 2023-11-21 DIAGNOSIS — J441 Chronic obstructive pulmonary disease with (acute) exacerbation: Secondary | ICD-10-CM | POA: Diagnosis not present

## 2023-11-21 DIAGNOSIS — I429 Cardiomyopathy, unspecified: Secondary | ICD-10-CM | POA: Diagnosis not present

## 2023-11-21 DIAGNOSIS — A310 Pulmonary mycobacterial infection: Secondary | ICD-10-CM | POA: Diagnosis not present

## 2023-11-21 DIAGNOSIS — J439 Emphysema, unspecified: Secondary | ICD-10-CM | POA: Diagnosis not present

## 2023-11-21 DIAGNOSIS — E2740 Unspecified adrenocortical insufficiency: Secondary | ICD-10-CM

## 2023-11-21 DIAGNOSIS — J449 Chronic obstructive pulmonary disease, unspecified: Secondary | ICD-10-CM

## 2023-11-21 DIAGNOSIS — G4736 Sleep related hypoventilation in conditions classified elsewhere: Secondary | ICD-10-CM

## 2023-11-21 DIAGNOSIS — F17210 Nicotine dependence, cigarettes, uncomplicated: Secondary | ICD-10-CM

## 2023-11-21 MED ORDER — HYDROCORTISONE 10 MG PO TABS: ORAL_TABLET | ORAL | 0 refills | Status: AC

## 2023-11-21 MED ORDER — NAPROXEN SODIUM 550 MG PO TABS: 550.0000 mg | ORAL_TABLET | Freq: Two times a day (BID) | ORAL | 1 refills | Status: AC | PRN

## 2023-11-21 NOTE — Patient Instructions (Signed)
VISIT SUMMARY:  During today's visit, we discussed your persistent cough, lethargy, and recent respiratory issues. We reviewed your current medications and the importance of quitting smoking to improve your lung health. We also talked about the benefits of pulmonary rehabilitation and the need for physical therapy to aid in your recovery.  YOUR PLAN:  -CHRONIC OBSTRUCTIVE PULMONARY DISEASE (COPD): COPD is a chronic lung disease that causes breathing difficulties due to airway inflammation and obstruction. To manage your COPD, continue using Breztri until you receive your nebulizer medication, and use albuterol as needed. We have sent a prescription for hydrocortisone (take 2 in the morning and 1 in the afternoon). It is crucial to quit smoking to prevent further lung damage, and we discussed using nicotine alternatives like Zine patches.  -NEED FOR PULMONARY REHABILITATION: Pulmonary rehabilitation can help improve your lung function and mobility. We will arrange for virtual pulmonary rehab sessions and coordinate with your primary care physician to order in-home physical therapy.  -POST-HOSPITALIZATION RECOVERY: After your recent hospital discharge, you have been experiencing lethargy. You have completed your prednisone course and are now on hydrocortisone (2 in the morning, 1 in the afternoon). While cough medicine with codeine helps, it is not a long-term solution. We will discuss non-narcotic pain management options to avoid the risks of long-term narcotic use.  -GENERAL HEALTH MAINTENANCE: Maintaining good hydration is important, especially if you switch to guaifenesin tablets. Consider using liquid guaifenesin for better hydration and drink plenty of water.  INSTRUCTIONS:  Please schedule a follow-up appointment in 3-4 weeks.

## 2023-11-21 NOTE — Progress Notes (Signed)
Subjective:    Patient ID: Paul Carpenter, male    DOB: Sep 27, 1956, 68 y.o.   MRN: [id number]  Patient Care Team: Barbette Reichmann, MD as PCP - General (Internal Medicine) Wyn Quaker Marlow Baars, MD as Referring Physician (Vascular Surgery) Jeralyn Ruths, MD as Consulting Physician (Oncology) Carmina Miller, MD as Referring Physician (Radiation Oncology) Salena Saner, MD as Consulting Physician (Pulmonary Disease) Lynn Ito, MD as Consulting Physician (Infectious Diseases) Epimenio Sarin, MD as Referring Physician (Pulmonary Disease)  Chief Complaint  Patient presents with   Follow-up    DOE. Some wheezing. Cough with greenish/dark brown sputum    BACKGROUND/INTERVAL: [name] is a very complex 68 year old current smoker (1PPD, 150 PY) with a history as noted below, who presents for follow-up of a cavitary right upper lobe process in the setting of history of non-small cell carcinoma of the lung, Mycobacterium avium complex infection, stage II COPD and chronic dyspnea/fatigue.  This is a posthospital visit.  He was last seen 08 November 2023, he is also followed at [facility] at the pulmonary specialty clinic for management of his RESISTANT MAC.  During his last visit he was assessed thoroughly and required subsequent admission to [facility] for COPD exacerbation.  Upon discharge he resumed his habit of 1 pack of cigarettes per day smoking.  He was admitted between 07 November 2018 25 through 14 November 2023.  HPI Discussed the use of AI scribe software for clinical note transcription with the patient, who gave verbal consent to proceed.  History of Present Illness   The patient, with a history of stage III COPD, lung cancer and MAC, presents with a persistent cough and lethargy. He reports that the cough medicine, presumably containing codeine, helps with his symptoms and seems to aid in breaking up congestion. However, he is aware that he cannot stay on this medication indefinitely.  He has also been taking Vicodin, but the quantity and frequency are unclear.  This is for a issue with chest pain that he has had for a number of years.  The patient has recently started on Lexapro by his primary care provider due to issues with depression. He has also been prescribed prednisone, which he has completed, and is now on hydrocortisone.  Prednisone was the taper provided through the hospital.  He is chronically on hydrocortisone due to adrenal insufficiency.  The patient has resumed smoking, despite recent hospitalization. He is aware of the detrimental effects on his respiratory health but has not yet quit.  The patient's wife mentions that he has been lethargic and suggests that he may benefit from in-home physical therapy.  He was participating well during his hospitalization.  He is not overly lethargic at the time of this examination.  The patient has been using albuterol and is advised to continue using Breztri until he can get nebulizer medications. He has also been taking guaifenesin (Mucinex) and is advised to drink plenty of water if he switches to pill form.  The patient's wife suggests that the patient may have caught a respiratory infection around [place], which he is having difficulty shaking off. This is not confirmed but could explain the persistence of the patient's symptoms.  On the other hand, the patient did markedly better with cigarette smoking abstinence during his hospitalization.  He was advised that he needs to quit smoking otherwise there are no further interventions that can be offered.     Review of Systems A 10 point review of systems was performed and  it is as noted above otherwise negative.   Patient Active Problem List   Diagnosis Date Noted   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   Chronic adrenal insufficiency (HCC) 11/09/2023   Acute on chronic hypoxic respiratory failure (HCC) 11/09/2023   COPD with acute exacerbation  (HCC) 11/08/2023   Pulmonary cavitary lesion 11/08/2023   Chronic respiratory failure with hypoxia (HCC) 11/08/2023   Hyponatremia 09/11/2022   Generalized weakness 09/10/2022   Shortness of breath 02/03/2022   SIADH (syndrome of inappropriate ADH production) (HCC) 09/22/2021   Overweight (BMI 25.0-29.9) 09/22/2021   Hypomagnesemia 09/21/2021   Elevated hemoglobin A1c 06/19/2020   Hypothyroidism, acquired 05/30/2019   Squamous cell lung cancer, right (HCC) 05/30/2019   HFrEF (heart failure with reduced ejection fraction) (HCC) 03/06/2019   Atherosclerosis 12/14/2018   Non-small cell lung cancer, right (HCC) 04/24/2018   Oral ulcer 02/16/2018   Pituitary disorder (HCC) 02/16/2018   Migraine headache 03/07/2017   HCAP (healthcare-associated pneumonia) 09/11/2016   COPD exacerbation (HCC) 09/11/2016   Chronic hyponatremia 09/11/2016   Leukocytosis 09/11/2016   Thrombocytopenia (HCC) 09/11/2016   Cigarette smoker 06/11/2016   Cervical radiculopathy 04/15/2016   Cervical disc disorder at C5-C6 level with radiculopathy 03/09/2016   Impingement syndrome of right shoulder 03/09/2016   Health care maintenance 09/29/2015   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   Benign essential hypertension 05/28/2015   Polycythemia 03/24/2015   Carotid artery disease (HCC) 12/12/2014   Disequilibrium 12/12/2014   Mixed hyperlipidemia 12/10/2014   Incomplete emptying of bladder 06/04/2014   Anxiety 05/18/2014   Chronic coronary artery disease 05/18/2014   Chronic headaches 05/18/2014   Acute shoulder pain 03/15/2014   Impingement syndrome of left shoulder 03/15/2014   Lung mass 12/06/2013   Kidney stone 11/24/2013   COPD (chronic obstructive pulmonary disease) (HCC) 04/24/2013   Tobacco use disorder 04/24/2013   Obstructive sleep apnea 04/24/2013   Benign localized prostatic hyperplasia with lower urinary tract symptoms (LUTS) 07/04/2012   Encounter for long-term current use of medication 07/04/2012   ED  (erectile dysfunction) of organic origin 07/04/2012   Testicular hypofunction 07/04/2012    Social History   Tobacco Use   Smoking status: Every Day    Current packs/day: 3.00    Average packs/day: 3.0 packs/day for 50.0 years (150.0 ttl pk-yrs)    Types: Cigarettes    Passive exposure: Current   Smokeless tobacco: Never   Tobacco comments:    1 PPD khj 11/21/2023  Substance Use Topics   Alcohol use: Not Currently    Alcohol/week: 2.0 standard drinks of alcohol    Types: 2 Standard drinks or equivalent per week    Comment: moderate    Allergies  Allergen Reactions   Lisinopril Rash   Varenicline Rash    Current Meds  Medication Sig   acetaminophen (TYLENOL) 500 MG tablet Take 500 mg by mouth daily as needed.   albuterol (PROVENTIL) (2.5 MG/3ML) 0.083% nebulizer solution TAKE 3 MLS (2.5 MG TOTAL) BY NEBULIZATION 4 (FOUR) TIMES DAILY AS NEEDED FOR WHEEZING OR SHORTNESS OF BREATH.   albuterol (VENTOLIN HFA) 108 (90 Base) MCG/ACT inhaler INHALE 2 PUFFS BY MOUTH EVERY 4 HOURS AS NEEDED FOR WHEEZE OR FOR SHORTNESS OF BREATH   amikacin (AMIKIN) 1 GM/4ML SOLN injection Inject into the muscle daily.   arformoterol (BROVANA) 15 MCG/2ML NEBU Take 2 mLs (15 mcg total) by nebulization 2 (two) times daily.   atorvastatin (LIPITOR) 10 MG tablet Take 10 mg by mouth daily.   azithromycin (ZITHROMAX) 500 MG  tablet TAKE 1 TABLET (500 MG TOTAL) BY MOUTH DAILY.   budesonide (PULMICORT) 0.5 MG/2ML nebulizer solution Take 2 mLs (0.5 mg total) by nebulization 2 (two) times daily.   chlorpheniramine-HYDROcodone (TUSSIONEX) 10-8 MG/5ML Take 5 mLs by mouth every 12 (twelve) hours as needed for cough.   clofazimine 50 mg CAPS capsule (for compassionate use) Take 100 mg by mouth daily with breakfast.   clotrimazole (MYCELEX) 10 MG troche Take 10 mg by mouth 3 (three) times daily.   diazepam (VALIUM) 5 MG tablet Take 5 mg by mouth at bedtime as needed.   ethambutol (MYAMBUTOL) 400 MG tablet Take 3 tablets  (1,200 mg total) by mouth daily.   ferrous sulfate 325 (65 FE) MG tablet Take 325 mg by mouth at bedtime.   folic acid (FOLVITE) 1 MG tablet Take 1 mg by mouth daily.   guaiFENesin (MUCINEX) 600 MG 12 hr tablet Take 1 tablet (600 mg total) by mouth 2 (two) times daily.   hydrocortisone (CORTEF) 10 MG tablet Take 2 tablets in the morning and 1 tablet mid afternoon (2 to 4 PM)   levothyroxine (SYNTHROID) 175 MCG tablet Take 175 mcg by mouth daily before breakfast.   lidocaine-prilocaine (EMLA) cream APPLY 1 APPLICATION TOPICALLY AS NEEDED.   magnesium oxide (MAG-OX) 400 MG tablet Take 800 mg by mouth 2 (two) times daily.   melatonin 3 MG TABS tablet Take 3 mg by mouth at bedtime as needed.   metoprolol succinate (TOPROL-XL) 25 MG 24 hr tablet Take 12.5-25 mg by mouth daily as needed (Acute High Blood Pressure).   naproxen sodium (ANAPROX DS) 550 MG tablet Take 1 tablet (550 mg total) by mouth 2 (two) times daily as needed for moderate pain (pain score 4-6). Take with meals   nicotine (NICODERM CQ - DOSED IN MG/24 HOURS) 21 mg/24hr patch Place 21 mg onto the skin daily.   nitroGLYCERIN (NITROSTAT) 0.4 MG SL tablet Place under the tongue.   OXYGEN Inhale 2 L into the lungs at bedtime.   pantoprazole (PROTONIX) 40 MG tablet Take 1 tablet (40 mg total) by mouth 2 (two) times daily before meals   revefenacin (YUPELRI) 175 MCG/3ML nebulizer solution Take 3 mLs (175 mcg total) by nebulization daily.   sodium chloride 1 g tablet Take 5 tablets (5 g total) by mouth 3 (three) times daily with meals.   tadalafil (CIALIS) 5 MG tablet Take 1 tablet (5 mg total) by mouth daily as needed for erectile dysfunction.   [DISCONTINUED] hydrocortisone (CORTEF) 10 MG tablet HOLD UNTIL COMPLETION OF MEDROL TAPER. Take two tablets in the morning. Take one tablet in the afternoon (between 2-4pm).    Immunization History  Administered Date(s) Administered   Fluad Quad(high Dose 65+) 09/22/2020   Influenza Inj Mdck Quad  Pf 09/06/2016, 10/02/2019, 09/09/2021, 09/03/2022   Influenza Split 08/05/2013, 07/23/2014, 08/18/2015   Influenza Whole 08/01/2012   Influenza,inj,Quad PF,6+ Mos 08/14/2018   Influenza-Unspecified 09/06/2016, 08/17/2017, 09/09/2021   Zoster Recombinant(Shingrix) 06/19/2020        Objective:     BP 132/82 (BP Location: Left Arm, Cuff Size: Normal)   Pulse 89   Temp 97.7 F (36.5 C)   Ht 6' (1.829 m)   Wt 174 lb 12.8 oz (79.3 kg)   SpO2 97%   BMI 23.71 kg/m   SpO2: 97 % O2 Device: None (Room air)  GENERAL: Chronically ill-appearing man, mild tachypnea.  No conversational dyspnea.  Awake and alert.   HEAD: Normocephalic, atraumatic.  EYES: Pupils equal,  round, reactive to light.  No scleral icterus.  MOUTH: Oral mucosa moist.  No thrush. NECK: Supple. No thyromegaly. Trachea midline. No JVD.  No adenopathy. PULMONARY: Good air entry bilaterally. Amphoric sounds on the right upper lung zone.  Scattered rhonchi noted.  No wheezes noted. CARDIOVASCULAR: S1 and S2. Regular rate and rhythm.  No rubs, murmurs or gallops heard. ABDOMEN: Benign. MUSCULOSKELETAL: No joint deformity, no clubbing, no edema.  NEUROLOGIC: No overt focal deficit. Speech is fluent. SKIN: Intact,warm,dry. PSYCH: Depressed mood and flat affect.       Assessment & Plan:     ICD-10-CM   1. Stage 3 severe COPD by GOLD classification (HCC)  J44.9 AMB referral to pulmonary rehabilitation    2. COPD exacerbation (HCC)  J44.1 AMB referral to pulmonary rehabilitation   Recent exacerbation Needs pulmonary rehab    3. Mycobacterium avium infection (HCC)  A31.0     4. Cardiomyopathy, unspecified type (HCC)  I42.9    LVEF 40%    5. Nocturnal hypoxemia due to emphysema (HCC)  J43.9 AMB referral to pulmonary rehabilitation   G47.36     6. Chronic adrenal insufficiency (HCC)  E27.40     7. Tobacco dependence due to cigarettes  F17.210    Patient counseled extensively with regards to discontinuation of  smoking     Orders Placed This Encounter  Procedures   AMB referral to pulmonary rehabilitation    Referral Priority:   Routine    Referral Type:   Consultation    Referred to Provider:   [facility]    Number of Visits Requested:   1   Meds ordered this encounter  Medications   naproxen sodium (ANAPROX DS) 550 MG tablet    Sig: Take 1 tablet (550 mg total) by mouth 2 (two) times daily as needed for moderate pain (pain score 4-6). Take with meals    Dispense:  30 tablet    Refill:  1   hydrocortisone (CORTEF) 10 MG tablet    Sig: Take 2 tablets in the morning and 1 tablet mid afternoon (2 to 4 PM)    Dispense:  90 tablet    Refill:  0   Discussion:    Chronic Obstructive Pulmonary Disease (COPD) COPD with recent exacerbation characterized by increased coughing and congestion. Continued smoking exacerbates airway inflammation and impairs lung function. Emphasized the critical need to quit smoking to prevent further deterioration. Discussed potential use of nicotine alternatives such as ZYN pouches. Explained that continued smoking will lead to continued rapid deterioration. - Resume Breztri until nebulizer medication is obtained - Continue albuterol as needed - Send prescription for hydrocortisone (2 in the morning, 1 in the afternoon) - Order nebulizer medication under Medicare Part B - Advise to quit smoking - Discuss potential use of nicotine alternatives (e.g., Zyn pouches)  Need for Pulmonary Rehabilitation Would benefit from pulmonary rehabilitation to improve lung function and mobility. Discussed virtual pulmonary rehab sessions and the need for physical therapy to be ordered by primary care physician. - Order pulmonary rehab through virtual visits versus [facility] home care - Coordinate with primary care physician for in-home physical therapy  Post-Hospitalization Recovery Recently discharged from the hospital with lethargy. Completed prednisone course  and currently on hydrocortisone. Using cough medicine with codeine, which provides symptomatic relief but is not a long-term solution. Discussed non-narcotic pain management options due to the risks associated with long-term narcotic use.  Wife feels lethargy coincided with Lexapro prescription. - Continue hydrocortisone (2  in the morning, 1 in the afternoon) - Provide one more refill of Tussionex - Discuss non-narcotic pain management options  General Health Maintenance Needs to maintain general health practices to support recovery and manage chronic conditions. Discussed the importance of hydration, especially when using guaifenesin tablets. - Advise increased water intake if using guaifenesin tablets  Follow-up - Schedule follow-up appointment in 3-4 weeks.      Advised if symptoms do not improve or worsen, to please contact office for sooner follow up or seek emergency care.    I spent 60 minutes of dedicated to the care of this patient on the date of this encounter to include pre-visit review of records, face-to-face time with the patient discussing conditions above, post visit ordering of testing, clinical documentation with the electronic health record, making appropriate referrals as documented, and communicating necessary findings to members of the patients care team.     C. Danice Goltz, MD Advanced Bronchoscopy PCCM [facility]    *This note was generated using voice recognition software/Dragon and/or AI transcription program.  Despite best efforts to proofread, errors can occur which can change the meaning. Any transcriptional errors that result from this process are unintentional and may not be fully corrected at the time of dictation.

## 2023-11-25 ENCOUNTER — Telehealth: Payer: Self-pay | Admitting: Pulmonary Disease

## 2023-11-25 NOTE — Telephone Encounter (Signed)
I spoke with the patient. He wanted to clarify when he is suppose to take the nebulizer medications(Budesonide, Aldean Baker, and Yupelri). Is he suppose to take them back to back or wait in between?

## 2023-11-25 NOTE — Telephone Encounter (Signed)
Pls call PT with direction on how he should use his 3 neb medications. What order? Taken together?  His # is [phone]

## 2023-11-25 NOTE — Telephone Encounter (Signed)
I have called and notified the patient and I have sent him a MyChart message with the information.  Nothing further needed.

## 2023-11-25 NOTE — Telephone Encounter (Signed)
What I recommend is that he do the [place] together (these can be mixed together) in the morning and follow that a few minutes later with the Pulmicort (budesonide).  Then in the evening do the Brovana and follow a few minutes later with the Pulmicort (budesonide).  The budesonide is the only 1 that cannot be mixed together because it tends to foam.  The Mikael Spray is only once a day.

## 2023-11-29 ENCOUNTER — Other Ambulatory Visit: Payer: Self-pay | Admitting: Pulmonary Disease

## 2023-11-29 ENCOUNTER — Telehealth: Payer: Self-pay | Admitting: Pulmonary Disease

## 2023-11-29 MED ORDER — PROMETHAZINE-DM 6.25-15 MG/5ML PO SYRP: 5.0000 mL | ORAL_SOLUTION | Freq: Four times a day (QID) | ORAL | 1 refills | Status: DC | PRN

## 2023-11-29 NOTE — Telephone Encounter (Signed)
Please see 1/15 signed encounter. PT calling in again for Tussonix. States Dr. Jayme Cloud told him she'd only fill it in 3 times. He just finished his second bottle.   Pharm is [facility] in [place]

## 2023-11-29 NOTE — Telephone Encounter (Signed)
I cannot send another prescription this soon.  I have sent an alternative cough medication to his pharmacy.

## 2023-11-29 NOTE — Progress Notes (Unsigned)
Patient requesting Tussionex again.  I have advised them that this could not be refilled so soon he had a prescription 15 days ago and is already out of the medication.  Will send prescription for Phenergan DM.  Gailen Shelter, MD Advanced Bronchoscopy PCCM [facility]

## 2023-11-29 NOTE — Telephone Encounter (Signed)
I have notified the patient. Nothing further needed.

## 2023-12-02 NOTE — Telephone Encounter (Signed)
 NFN

## 2023-12-12 ENCOUNTER — Ambulatory Visit: Payer: Medicare Other | Admitting: Pulmonary Disease

## 2023-12-12 ENCOUNTER — Encounter: Payer: Self-pay | Admitting: Pulmonary Disease

## 2023-12-12 VITALS — BP 124/74 | HR 98 | Temp 97.1°F | Ht 72.0 in | Wt 164.0 lb

## 2023-12-12 DIAGNOSIS — I5020 Unspecified systolic (congestive) heart failure: Secondary | ICD-10-CM

## 2023-12-12 DIAGNOSIS — J449 Chronic obstructive pulmonary disease, unspecified: Secondary | ICD-10-CM

## 2023-12-12 DIAGNOSIS — F17210 Nicotine dependence, cigarettes, uncomplicated: Secondary | ICD-10-CM

## 2023-12-12 DIAGNOSIS — A310 Pulmonary mycobacterial infection: Secondary | ICD-10-CM | POA: Diagnosis not present

## 2023-12-12 DIAGNOSIS — J439 Emphysema, unspecified: Secondary | ICD-10-CM | POA: Diagnosis not present

## 2023-12-12 NOTE — Patient Instructions (Signed)
 VISIT SUMMARY:  Today, we discussed your ongoing health concerns, including your COPD, tobacco use, resistant Mycobacterium avium infection, history of lung cancer, congestive heart failure, thyroid  condition, and anxiety related to your health. We reviewed your current treatments and discussed potential changes to improve your quality of life.  YOUR PLAN:  -CHRONIC OBSTRUCTIVE PULMONARY DISEASE (COPD): COPD is a chronic lung disease that makes it hard to breathe. We encourage you to stop smoking to slow the progression of COPD and improve your quality of life. Continue using your nebulizer treatments (Yupelri , Pulmicort , albuterol ) and we will follow up on your culture results. Please schedule a follow-up appointment in 6-8 weeks.  -TOBACCO USE DISORDER: Tobacco use disorder is a dependence on tobacco products. Quitting smoking is crucial for your health. We encourage you to try nicotine  patches or other cessation aids to help you quit.  -RESISTANT MYCOBACTERIUM AVIUM COMPLEX (MAC) INFECTION: This is a lung infection caused by bacteria that are resistant to treatment. We are waiting for your culture results to determine the best course of action.  -CONGESTIVE HEART FAILURE: Congestive heart failure is a condition where the heart does not pump blood as well as it should. We will continue to monitor your symptoms and manage them according to your current treatment plan.  -HYPOTHYROIDISM: Hypothyroidism is when your thyroid  gland does not produce enough hormones. Continue with your current thyroid  management regimen.  -HISTORY OF LUNG CANCER: You had lung cancer that was successfully treated. We will continue to monitor for any signs of recurrence or complications.  -ANXIETY RELATED TO HEALTH CONDITIONS: You experience anxiety related to your health and oxygen levels. Consider purchasing an Inogen portable oxygen concentrator for safety and reassurance. Engage in activities that reduce anxiety and  improve your quality of life.  INSTRUCTIONS:  Please schedule a follow-up appointment in 6-8 weeks. We will also follow up on your culture results.

## 2023-12-12 NOTE — Progress Notes (Signed)
 Subjective:    Patient ID: Paul Carpenter, male    DOB: 01/03/1956, 69 y.o.   MRN: [id number]  Patient Care Team: Paul Baumgarten, MD as PCP - General (Internal Medicine) Paul Guardian Donald Frost, MD as Referring Physician (Vascular Surgery) Paul Dials, MD as Consulting Physician (Oncology) Paul Langdon, MD as Referring Physician (Radiation Oncology) Paul Senior, MD as Consulting Physician (Pulmonary Disease) Paul Inks, MD as Consulting Physician (Infectious Diseases) Paul Hawthorne, MD as Referring Physician (Pulmonary Disease)  Chief Complaint  Patient presents with   Follow-up    SOB. Cough. Wheezing.     BACKGROUND/INTERVAL: Paul Carpenter is a very complex 68 year old current smoker (1.5 PPD, 150 PY) with a history as noted below, who presents for follow-up of a cavitary right upper lobe process in the setting of history of non-small cell carcinoma of the lung, Mycobacterium avium complex infection, stage II COPD and chronic dyspnea/fatigue.  This is a posthospital visit.  He was last seen 08 November 2023, he is also followed at [facility] at the pulmonary specialty clinic for management of his RESISTANT MAC.   He was admitted to [facility] between 07 November 2018 25 through 14 November 2023 for COPD exacerbation.  Upon discharge he resumed his habit of 1 pack of cigarettes per day smoking.  He was last seen on 21 November 2023, he is up to 1.5 packs of cigarettes per day smoking.  HPI Discussed the use of AI scribe software for clinical note transcription with the patient, who gave verbal consent to proceed.  History of Present Illness   Paul Carpenter is a 68 year old male with stage 3 severe COPD, ongoing tobacco use, resistant mycobacterium avium infection, and prior lung cancer who presents for follow-up.  He describes progressively worsening COPD symptoms, including increased shortness of breath and cough. He is concerned about the trajectory of his condition and notes  that his lung condition is not improving. He uses nebulizers at home, including Yupelri , Pulmicort , and Brovana , along with albuterol  and a rescue inhaler every three to four hours.  He continues to use tobacco despite understanding its negative impact on his condition. He has tried nicotine  patches but continues to smoke with the patches on, which increases nicotine  cravings.  He has a resistant Mycobacterium avium infection, and had a recent culture collected which is still processing. This infection is contributing to his respiratory symptoms.  He has a history of prior lung cancer diagnosed at age 43, for which he underwent treatment successfully. The cancer is currently not active.  He has a history of congestive heart failure, which complicates his respiratory condition and overall health management.  He has hypothyroidism which developed during his immunotherapy for lung cancer.Paul Carpenter  He experiences fatigue and frustration with his current quality of life, stating he is 'tired' and 'wore out'. He has considered hospice care but is uncertain about this option.  He was evaluated by palliative care during his admission at [facility].  He experiences panic attacks related to his oxygen levels, which he monitors with a personal oxygen meter. His oxygen levels generally hold in the 90s but can drop into the high 80s during coughing spells at night. He is apprehensive about leaving home without oxygen support, although he does not wear it constantly. He is considering purchasing an Inogen for safety reasons.  He has been qualified for general oxygen but has not met qualification for daytime oxygen.     Review of Systems A 10 point review  of systems was performed and it is as noted above otherwise negative.   Patient Active Problem List   Diagnosis Date Noted   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   Chronic adrenal insufficiency (HCC) 11/09/2023   Acute on chronic hypoxic  respiratory failure (HCC) 11/09/2023   COPD with acute exacerbation (HCC) 11/08/2023   Pulmonary cavitary lesion 11/08/2023   Chronic respiratory failure with hypoxia (HCC) 11/08/2023   Hyponatremia 09/11/2022   Generalized weakness 09/10/2022   Shortness of breath 02/03/2022   SIADH (syndrome of inappropriate ADH production) (HCC) 09/22/2021   Overweight (BMI 25.0-29.9) 09/22/2021   Hypomagnesemia 09/21/2021   Elevated hemoglobin A1c 06/19/2020   Hypothyroidism, acquired 05/30/2019   Squamous cell lung cancer, right (HCC) 05/30/2019   HFrEF (heart failure with reduced ejection fraction) (HCC) 03/06/2019   Atherosclerosis 12/14/2018   Non-small cell lung cancer, right (HCC) 04/24/2018   Oral ulcer 02/16/2018   Pituitary disorder (HCC) 02/16/2018   Migraine headache 03/07/2017   HCAP (healthcare-associated pneumonia) 09/11/2016   COPD exacerbation (HCC) 09/11/2016   Chronic hyponatremia 09/11/2016   Leukocytosis 09/11/2016   Thrombocytopenia (HCC) 09/11/2016   Cigarette smoker 06/11/2016   Cervical radiculopathy 04/15/2016   Cervical disc disorder at C5-C6 level with radiculopathy 03/09/2016   Impingement syndrome of right shoulder 03/09/2016   Health care maintenance 09/29/2015   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   Benign essential hypertension 05/28/2015   Polycythemia 03/24/2015   Carotid artery disease (HCC) 12/12/2014   Disequilibrium 12/12/2014   Mixed hyperlipidemia 12/10/2014   Incomplete emptying of bladder 06/04/2014   Anxiety 05/18/2014   Chronic coronary artery disease 05/18/2014   Chronic headaches 05/18/2014   Acute shoulder pain 03/15/2014   Impingement syndrome of left shoulder 03/15/2014   Lung mass 12/06/2013   Kidney stone 11/24/2013   COPD (chronic obstructive pulmonary disease) (HCC) 04/24/2013   Tobacco use disorder 04/24/2013   Obstructive sleep apnea 04/24/2013   Benign localized prostatic hyperplasia with lower urinary tract symptoms (LUTS) 07/04/2012    Encounter for long-term current use of medication 07/04/2012   ED (erectile dysfunction) of organic origin 07/04/2012   Testicular hypofunction 07/04/2012    Social History   Tobacco Use   Smoking status: Every Day    Current packs/day: 3.00    Average packs/day: 3.0 packs/day for 50.0 years (150.0 ttl pk-yrs)    Types: Cigarettes    Passive exposure: Current   Smokeless tobacco: Never   Tobacco comments:    1.5 PPD khj 12/12/2023  Substance Use Topics   Alcohol use: Not Currently    Alcohol/week: 2.0 standard drinks of alcohol    Types: 2 Standard drinks or equivalent per week    Comment: moderate    Allergies  Allergen Reactions   Lisinopril Rash   Varenicline  Rash    Current Meds  Medication Sig   acetaminophen  (TYLENOL ) 500 MG tablet Take 500 mg by mouth daily as needed.   albuterol  (PROVENTIL ) (2.5 MG/3ML) 0.083% nebulizer solution TAKE 3 MLS (2.5 MG TOTAL) BY NEBULIZATION 4 (FOUR) TIMES DAILY AS NEEDED FOR WHEEZING OR SHORTNESS OF BREATH.   albuterol  (VENTOLIN  HFA) 108 (90 Base) MCG/ACT inhaler INHALE 2 PUFFS BY MOUTH EVERY 4 HOURS AS NEEDED FOR WHEEZE OR FOR SHORTNESS OF BREATH   amikacin  (AMIKIN ) 1 GM/4ML SOLN injection Inject into the muscle daily.   arformoterol  (BROVANA ) 15 MCG/2ML NEBU Take 2 mLs (15 mcg total) by nebulization 2 (two) times daily.   atorvastatin  (LIPITOR) 10 MG tablet Take 10 mg by mouth daily.  azithromycin  (ZITHROMAX ) 500 MG tablet TAKE 1 TABLET (500 MG TOTAL) BY MOUTH DAILY.   budesonide  (PULMICORT ) 0.5 MG/2ML nebulizer solution Take 2 mLs (0.5 mg total) by nebulization 2 (two) times daily.   clofazimine  50 mg CAPS capsule (for compassionate use) Take 100 mg by mouth daily with breakfast.   clotrimazole  (MYCELEX ) 10 MG troche Take 10 mg by mouth 3 (three) times daily.   diazepam  (VALIUM ) 2 MG tablet Take by mouth. 2 mg in the morning and 5mg  at bedtime   diazepam  (VALIUM ) 5 MG tablet Take 5 mg by mouth at bedtime as needed.   ethambutol   (MYAMBUTOL ) 400 MG tablet Take 3 tablets (1,200 mg total) by mouth daily.   ferrous sulfate  325 (65 FE) MG tablet Take 325 mg by mouth at bedtime.   folic acid  (FOLVITE ) 1 MG tablet Take 1 mg by mouth daily.   guaiFENesin  (MUCINEX ) 600 MG 12 hr tablet Take 1 tablet (600 mg total) by mouth 2 (two) times daily.   hydrocortisone  (CORTEF ) 10 MG tablet Take 2 tablets in the morning and 1 tablet mid afternoon (2 to 4 PM)   levothyroxine  (SYNTHROID ) 175 MCG tablet Take 175 mcg by mouth daily before breakfast.   lidocaine -prilocaine  (EMLA ) cream APPLY 1 APPLICATION TOPICALLY AS NEEDED.   magnesium  oxide (MAG-OX) 400 MG tablet Take 800 mg by mouth 2 (two) times daily.   melatonin 3 MG TABS tablet Take 3 mg by mouth at bedtime as needed.   metoprolol  succinate (TOPROL -XL) 25 MG 24 hr tablet Take 12.5-25 mg by mouth daily as needed (Acute High Blood Pressure).   naproxen  sodium (ANAPROX  DS) 550 MG tablet Take 1 tablet (550 mg total) by mouth 2 (two) times daily as needed for moderate pain (pain score 4-6). Take with meals   nicotine  (NICODERM CQ  - DOSED IN MG/24 HOURS) 21 mg/24hr patch Place 21 mg onto the skin daily.   nitroGLYCERIN  (NITROSTAT ) 0.4 MG SL tablet Place under the tongue.   OXYGEN Inhale 2 L into the lungs at bedtime.   pantoprazole  (PROTONIX ) 40 MG tablet Take 1 tablet (40 mg total) by mouth 2 (two) times daily before meals   promethazine -dextromethorphan  (PROMETHAZINE -DM) 6.25-15 MG/5ML syrup Take 5 mLs by mouth 4 (four) times daily as needed for cough.   revefenacin  (YUPELRI ) 175 MCG/3ML nebulizer solution Take 3 mLs (175 mcg total) by nebulization daily.   sodium chloride  1 g tablet Take 5 tablets (5 g total) by mouth 3 (three) times daily with meals.   tadalafil  (CIALIS ) 5 MG tablet Take 1 tablet (5 mg total) by mouth daily as needed for erectile dysfunction.    Immunization History  Administered Date(s) Administered   Fluad Quad(high Dose 65+) 09/22/2020   Influenza Inj Mdck Quad Pf  09/06/2016, 10/02/2019, 09/09/2021, 09/03/2022   Influenza Split 08/05/2013, 07/23/2014, 08/18/2015   Influenza Whole 08/01/2012   Influenza,inj,Quad PF,6+ Mos 08/14/2018   Influenza-Unspecified 09/06/2016, 08/17/2017, 09/09/2021   Zoster Recombinant(Shingrix) 06/19/2020        Objective:     BP 124/74 (BP Location: Right Arm, Cuff Size: Normal)   Pulse 98   Temp (!) 97.1 F (36.2 C)   Ht 6' (1.829 m)   Wt 164 lb (74.4 kg)   SpO2 98%   BMI 22.24 kg/m   SpO2: 98 %  GENERAL: Chronically ill-appearing man, mild tachypnea.  No conversational dyspnea.  Awake and alert.   HEAD: Normocephalic, atraumatic.  EYES: Pupils equal, round, reactive to light.  No scleral icterus.  MOUTH:  Oral mucosa moist.  No thrush. NECK: Supple. No thyromegaly. Trachea midline. No JVD.  No adenopathy. PULMONARY: Good air entry bilaterally. Amphoric sounds on the right upper lung zone.  Scattered rhonchi noted.  No wheezes noted. CARDIOVASCULAR: S1 and S2. Regular rate and rhythm.  No rubs, murmurs or gallops heard. ABDOMEN: Benign. MUSCULOSKELETAL: No joint deformity, no clubbing, no edema.  NEUROLOGIC: No overt focal deficit. Speech is fluent. SKIN: Intact,warm,dry. PSYCH: Depressed mood and flat affect.  Irascible at times.  Assessment & Plan:     ICD-10-CM   1. Stage 3 severe COPD by GOLD classification (HCC)  J44.9     2. Nocturnal hypoxemia due to emphysema (HCC)  J43.9    G47.36     3. Mycobacterium avium infection (HCC)  A31.0     4. HFrEF (heart failure with reduced ejection fraction) (HCC)  I50.20     5. Tobacco dependence due to cigarettes  F17.210       Discussion:    Chronic Obstructive Pulmonary Disease (COPD) Stage three COPD with ongoing tobacco use. He continues to smoke despite understanding the negative impact on his condition. Smoking cessation would slow the progression of COPD and improve quality of life. He expressed frustration with his current health status and  treatment regimen. Discussed life expectancy variability and potential benefits of hospice care for symptom management. - Encourage smoking cessation - Continue current nebulizer treatments (Yupelri , Brovana , Pulmicort , albuterol ) - Follow up on culture results - Schedule follow-up appointment in 6-8 weeks  Tobacco Use Disorder Long history of tobacco use exacerbating COPD and other health conditions. He acknowledges the difficulty of quitting and the impact of smoking on his health but feels dependent on cigarettes.  He continues to be noncompliant with smoking cessation. - Encourage smoking cessation - Discuss potential use of nicotine  patches or other cessation aids  Resistant Mycobacterium Avium Complex (MAC) Infection Ongoing MAC infection in the lungs. Culture results pending. - Follow up on culture results  Congestive Heart Failure Congestive heart failure complicating overall health status. - Monitor symptoms and manage as per current treatment plan  Hypothyroidism Thyroid  dysfunction. - Continue current thyroid  management regimen  History of Lung Cancer Lung cancer treated successfully at age 30. Now 40 with residual health issues related to past cancer treatment. - Monitor for any signs of recurrence or complications  Anxiety Related to Health Conditions Significant anxiety related to health conditions and oxygen levels. Frequently checks oxygen saturation and experiences panic attacks when levels drop. Discussed purchasing an Inogen portable oxygen concentrator for safety and reassurance. - Considering purchasing an Inogen portable oxygen concentrator - Encourage activities to reduce anxiety and improve quality of life - Discussed end-of-life issues including consideration of enrolling in hospice - Continues follow-up with palliative care  Follow-up - Schedule follow-up appointment in 6-8 weeks.       Advised if symptoms do not improve or worsen, to please contact  office for sooner follow up or seek emergency care.    I spent 41 minutes of dedicated to the care of this patient on the date of this encounter to include pre-visit review of records, face-to-face time with the patient discussing conditions above, post visit ordering of testing, clinical documentation with the electronic health record, making appropriate referrals as documented, and communicating necessary findings to members of the patients care team.     C. Chloe Counter, MD Advanced Bronchoscopy PCC[facility]    *This note was generated using voice recognition software/Dragon and/or AI transcription  program.  Despite best efforts to proofread, errors can occur which can change the meaning. Any transcriptional errors that result from this process are unintentional and may not be fully corrected at the time of dictation.

## 2023-12-13 ENCOUNTER — Telehealth: Payer: Self-pay | Admitting: Pulmonary Disease

## 2023-12-13 MED ORDER — ARFORMOTEROL TARTRATE 15 MCG/2ML IN NEBU: 15.0000 ug | INHALATION_SOLUTION | Freq: Two times a day (BID) | RESPIRATORY_TRACT | 0 refills | Status: DC

## 2023-12-13 MED ORDER — ALBUTEROL SULFATE (2.5 MG/3ML) 0.083% IN NEBU: 2.5000 mg | INHALATION_SOLUTION | Freq: Four times a day (QID) | RESPIRATORY_TRACT | 11 refills | Status: AC | PRN

## 2023-12-13 MED ORDER — REVEFENACIN 175 MCG/3ML IN SOLN: 175.0000 ug | Freq: Every day | RESPIRATORY_TRACT | 0 refills | Status: DC

## 2023-12-13 NOTE — Telephone Encounter (Signed)
Pt is asking for refills to be sent to [facility] [place] for the 3 neb meds he was prescribed in hospital

## 2023-12-13 NOTE — Telephone Encounter (Signed)
I have sent in the prescriptions and notified the patient.  Nothing further needed.

## 2023-12-15 ENCOUNTER — Other Ambulatory Visit (HOSPITAL_COMMUNITY): Payer: Self-pay

## 2023-12-15 ENCOUNTER — Telehealth: Payer: Self-pay

## 2023-12-15 NOTE — Telephone Encounter (Signed)
*  Pulm  Pharmacy Patient Advocate Encounter  Received notification from [facility] that Prior Authorization for Arformoterol Tartrate 15MCG/2ML nebulizer solution  has been CANCELLED due to medication to be billed under Medicare Part B   PA #/Case ID/Reference #: [id number]   *called patient pharmacy and l/m to bill appropriately

## 2023-12-16 ENCOUNTER — Telehealth: Payer: Self-pay | Admitting: Pulmonary Disease

## 2023-12-16 NOTE — Telephone Encounter (Signed)
See telephone encounter from 12/15/2023- patient calling in regards to Arformoterol Tartrate 15MCG/2ML nebulizer solution states pharmacy filled incorrect insurance. Please contact patient with an update.

## 2023-12-16 NOTE — Telephone Encounter (Signed)
Patient advised that we did note on his prescription for pharmacy to file under Medicare part B. Patient advised to remind them to file under Part B when he picks up medication. Nothing further needed.

## 2023-12-20 ENCOUNTER — Inpatient Hospital Stay: Payer: Medicare Other

## 2023-12-21 ENCOUNTER — Telehealth: Payer: Self-pay | Admitting: Pulmonary Disease

## 2023-12-21 MED ORDER — BUDESONIDE 0.5 MG/2ML IN SUSP: 0.5000 mg | Freq: Two times a day (BID) | RESPIRATORY_TRACT | 6 refills | Status: DC

## 2023-12-21 NOTE — Telephone Encounter (Signed)
Pt states pharmacy is asking for pulmicort rx

## 2023-12-21 NOTE — Telephone Encounter (Signed)
New prescription sent. Nothing further needed.

## 2023-12-22 ENCOUNTER — Other Ambulatory Visit: Payer: Self-pay

## 2023-12-22 MED ORDER — BUDESONIDE 0.5 MG/2ML IN SUSP: 0.5000 mg | Freq: Two times a day (BID) | RESPIRATORY_TRACT | 6 refills | Status: DC

## 2023-12-23 ENCOUNTER — Telehealth: Payer: Self-pay

## 2023-12-23 ENCOUNTER — Other Ambulatory Visit: Payer: Self-pay | Admitting: Otolaryngology

## 2023-12-23 ENCOUNTER — Other Ambulatory Visit (HOSPITAL_COMMUNITY): Payer: Self-pay

## 2023-12-23 DIAGNOSIS — R1310 Dysphagia, unspecified: Secondary | ICD-10-CM

## 2023-12-23 NOTE — Telephone Encounter (Signed)
Pharmacy Patient Advocate Encounter   Received notification from CoverMyMeds that prior authorization for Budesonide 0.5MG /2ML suspension is required/requested.   Insurance verification completed.   The patient is insured through [facility] .   Per test claim: Must be processed through Medicare Part B

## 2023-12-23 NOTE — Telephone Encounter (Signed)
Per patient's pharmacy, Budesonide has been filled for the patient under Medicare Part B.  Nothing further needed.

## 2023-12-26 ENCOUNTER — Encounter: Payer: Self-pay | Admitting: Oncology

## 2023-12-26 ENCOUNTER — Ambulatory Visit
Admission: RE | Admit: 2023-12-26 | Discharge: 2023-12-26 | Disposition: A | Discharge status: Home / Self Care | Payer: Medicare Other | Source: Ambulatory Visit | Attending: Otolaryngology | Admitting: Otolaryngology

## 2023-12-26 ENCOUNTER — Other Ambulatory Visit: Payer: Medicare Other

## 2023-12-26 DIAGNOSIS — R1310 Dysphagia, unspecified: Secondary | ICD-10-CM

## 2024-01-02 ENCOUNTER — Telehealth: Payer: Self-pay

## 2024-01-02 ENCOUNTER — Other Ambulatory Visit: Payer: Self-pay | Admitting: *Deleted

## 2024-01-02 ENCOUNTER — Telehealth: Payer: Self-pay | Admitting: Pulmonary Disease

## 2024-01-02 DIAGNOSIS — Z95828 Presence of other vascular implants and grafts: Secondary | ICD-10-CM

## 2024-01-02 NOTE — Telephone Encounter (Signed)
 This is a referral Dr. Orlie Dakin will have to place as he wrote the original request and [facility]

## 2024-01-02 NOTE — Telephone Encounter (Signed)
 Patient said he did call [facility] and was advised a referral is needed.

## 2024-01-02 NOTE — Telephone Encounter (Signed)
 Patient states he has a port implanted and needs a referral to see Dr. Festus Barren with [facility]. Port has been burning for a while. Please advise on referral and call patient back at [phone].

## 2024-01-02 NOTE — Telephone Encounter (Signed)
 Patient requesting a referral to Dr. Wyn Quaker at [facility] for port evaluation.  He is concerned with the area around port burning and stinging sensation that radiates to axillary area.

## 2024-01-02 NOTE — Telephone Encounter (Signed)
 I have notified the patient. Nothing further needed.

## 2024-01-03 ENCOUNTER — Encounter: Payer: Self-pay | Admitting: Oncology

## 2024-01-04 ENCOUNTER — Other Ambulatory Visit: Payer: Self-pay

## 2024-01-04 MED ORDER — BUDESONIDE 0.5 MG/2ML IN SUSP: 0.5000 mg | Freq: Two times a day (BID) | RESPIRATORY_TRACT | 3 refills | Status: DC

## 2024-01-07 ENCOUNTER — Encounter: Payer: Self-pay | Admitting: Oncology

## 2024-01-10 ENCOUNTER — Telehealth: Payer: Self-pay | Admitting: Pulmonary Disease

## 2024-01-10 ENCOUNTER — Telehealth: Payer: Self-pay

## 2024-01-10 ENCOUNTER — Other Ambulatory Visit: Payer: Self-pay | Admitting: Pulmonary Disease

## 2024-01-10 MED ORDER — BUDESONIDE 0.5 MG/2ML IN SUSP: 0.5000 mg | Freq: Two times a day (BID) | RESPIRATORY_TRACT | 3 refills | Status: DC

## 2024-01-10 MED ORDER — REVEFENACIN 175 MCG/3ML IN SOLN: 175.0000 ug | Freq: Every day | RESPIRATORY_TRACT | 0 refills | Status: DC

## 2024-01-10 MED ORDER — ARFORMOTEROL TARTRATE 15 MCG/2ML IN NEBU: 15.0000 ug | INHALATION_SOLUTION | Freq: Two times a day (BID) | RESPIRATORY_TRACT | 0 refills | Status: DC

## 2024-01-10 NOTE — Telephone Encounter (Signed)
 Patient states needs refills for Pulmicort, Brovana, and Yupelri. Pharmacy is [facility] [place]. Patient phone number is [phone].

## 2024-01-10 NOTE — Telephone Encounter (Signed)
 I have sent in the medications and notified the patient.  Nothing further needed.

## 2024-01-10 NOTE — Telephone Encounter (Signed)
*  Pulm  Pharmacy Patient Advocate Encounter  Received notification from [facility] that Prior Authorization for Budesonide 0.5MG /2ML suspension  has been CANCELLED due to must be processed under Medicare Part B  *called and left message to patients pharmacy to bill appropriately

## 2024-01-13 ENCOUNTER — Other Ambulatory Visit: Payer: Self-pay | Admitting: Pulmonary Disease

## 2024-01-16 ENCOUNTER — Other Ambulatory Visit
Admission: RE | Admit: 2024-01-16 | Discharge: 2024-01-16 | Disposition: A | Discharge status: Home / Self Care | Attending: Nephrology | Admitting: Nephrology

## 2024-01-16 DIAGNOSIS — E871 Hypo-osmolality and hyponatremia: Secondary | ICD-10-CM | POA: Diagnosis present

## 2024-01-16 LAB — BASIC METABOLIC PANEL
Anion gap: 8 (ref 5–15)
BUN: 6 mg/dL — ABNORMAL LOW (ref 8–23)
CO2: 30 mmol/L (ref 22–32)
Calcium: 8.7 mg/dL — ABNORMAL LOW (ref 8.9–10.3)
Chloride: 99 mmol/L (ref 98–111)
Creatinine, Ser: 0.66 mg/dL (ref 0.61–1.24)
GFR, Estimated: >60 mL/min (ref >60)
Glucose, Bld: 88 mg/dL (ref 70–99)
Potassium: 3.7 mmol/L (ref 3.5–5.1)
Sodium: 137 mmol/L (ref 135–145)

## 2024-01-17 ENCOUNTER — Encounter: Payer: Self-pay | Admitting: Oncology

## 2024-01-17 ENCOUNTER — Encounter (INDEPENDENT_AMBULATORY_CARE_PROVIDER_SITE_OTHER): Payer: Self-pay | Admitting: Vascular Surgery

## 2024-01-17 ENCOUNTER — Ambulatory Visit (INDEPENDENT_AMBULATORY_CARE_PROVIDER_SITE_OTHER): Payer: Self-pay | Admitting: Vascular Surgery

## 2024-01-17 VITALS — BP 134/80 | HR 94 | Resp 18 | Ht 72.0 in | Wt 163.8 lb

## 2024-01-17 DIAGNOSIS — Z95828 Presence of other vascular implants and grafts: Secondary | ICD-10-CM

## 2024-01-17 DIAGNOSIS — C3491 Malignant neoplasm of unspecified part of right bronchus or lung: Secondary | ICD-10-CM

## 2024-01-17 DIAGNOSIS — I10 Essential (primary) hypertension: Secondary | ICD-10-CM

## 2024-01-17 DIAGNOSIS — E782 Mixed hyperlipidemia: Secondary | ICD-10-CM

## 2024-01-17 NOTE — Assessment & Plan Note (Signed)
 Follows with oncology.  See treatment plan as above for his Port-A-Cath.  This was the reason the port was placed initially.

## 2024-01-17 NOTE — Assessment & Plan Note (Signed)
 blood pressure control important in reducing the progression of atherosclerotic disease. On appropriate oral medications.

## 2024-01-17 NOTE — Assessment & Plan Note (Signed)
 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  He does not have any left arm symptoms which is encouraging and hopefully this does not include the superior vena cava and is just innominate vein issue.  He has another CT scan upcoming next month to follow-up on his malignancy.  If he has no evidence of recurrent disease and no longer needs his Port-A-Cath for chemotherapy, I would consider removing this and performing a venogram with intent to treat the central venous stenosis.  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  If he needs a new Port-A-Cath, I would consider placing this in the left chest.  He and his wife are going to call us after he gets his CT scan.

## 2024-01-17 NOTE — Progress Notes (Signed)
 Patient ID: Paul Carpenter, male   DOB: Aug 06, 1956, 68 y.o.   MRN: [id number]  Chief Complaint  Patient presents with   New Patient (Initial Visit)    np. conuslt. having issues with port in place. finnegan, Harper.    HPI Paul Carpenter is a 68 y.o. male.  I am asked to see the patient by Dr. Orlie Dakin for evaluation of pain and varicosities in his right chest and arm.  I last saw the patient about 6 years ago when we put a Port-A-Cath in for his chemotherapy treatments.  He has done quite well from his cancer issues but has had other problems and is currently being treated for MAC infection.  He is not using the port currently.  He has increasing varicosities and has had increasing pain in that arm over the past year or so.  He denies any left arm or chest symptoms.  No open wounds.  There is tenderness around the port.  He had a CT scan of chest from January of this year.  Although this was not a CT venogram and no mention is made on the official report of his venous structures, he has extensive venous collaterals and it would appear to my review that there is significant narrowing in the right innominate vein around the existing Port-A-Cath.   Past Medical History:  Diagnosis Date   Anginal pain (HCC)    Anxiety    Asthma    Chest pain    CHF (congestive heart failure) (HCC)    Chicken pox    Complication of anesthesia    o2 dropped after neck fusion   COPD (chronic obstructive pulmonary disease) (HCC)    Coronary artery disease    Cough    chronic  clear phlegm   Dysrhythmia    palpitations   GERD (gastroesophageal reflux disease)    h/o reflux/ hoarsness   Hematochezia    Hemorrhoids    History of chickenpox    History of colon polyps    History of Helicobacter pylori infection    Hoarseness    Hypertension    Lung cancer (HCC) 05/2016   Chemo + rad tx's.    Migraines    OSA (obstructive sleep apnea)    has CPAP but does not use   Personal history of tobacco  use, presenting hazards to health 03/05/2016   Pneumonia    5/17   Raynaud disease    Raynaud disease    Raynaud's disease    Rotator cuff tear    on right   Shortness of breath dyspnea    Sleep apnea    Ulcer (traumatic) of oral mucosa     Past Surgical History:  Procedure Laterality Date   BACK SURGERY     cervical fusion x 2   CARDIAC CATHETERIZATION     CERVICAL DISCECTOMY     x 2   COLONOSCOPY     COLONOSCOPY N/A 07/25/2015   Procedure: COLONOSCOPY;  Surgeon: Christena Deem, MD;  [facility] ENDOSCOPY;  Service: Endoscopy;  Laterality: N/A;   COLONOSCOPY WITH PROPOFOL N/A 10/04/2017   Procedure: COLONOSCOPY WITH PROPOFOL;  Surgeon: Christena Deem, MD;  Location[facility] ENDOSCOPY;  Service: Endoscopy;  Laterality: N/A;   COLONOSCOPY WITH PROPOFOL N/A 07/10/2020   Procedure: COLONOSCOPY WITH PROPOFOL;  Surgeon: Toledo, Boykin Nearing, MD;  Location: [facility] ENDOSCOPY;  Service: Gastroenterology;  Laterality: N/A;   ELECTROMAGNETIC NAVIGATION BROCHOSCOPY Left 06/28/2016   Procedure: ELECTROMAGNETIC NAVIGATION BRONCHOSCOPY;  Surgeon: Stephanie Acre, MD;  Location: [facility] ORS;  Service: Cardiopulmonary;  Laterality: Left;   ENDOBRONCHIAL ULTRASOUND N/A 04/11/2018   Procedure: ENDOBRONCHIAL ULTRASOUND;  Surgeon: Erin Fulling, MD;  Location: [facility] ORS;  Service: Cardiopulmonary;  Laterality: N/A;   ESOPHAGOGASTRODUODENOSCOPY N/A 07/25/2015   Procedure: ESOPHAGOGASTRODUODENOSCOPY (EGD);  Surgeon: Christena Deem, MD;  Location: [facility] ENDOSCOPY;  Service: Endoscopy;  Laterality: N/A;   ESOPHAGOGASTRODUODENOSCOPY (EGD) WITH PROPOFOL N/A 07/10/2020   Procedure: ESOPHAGOGASTRODUODENOSCOPY (EGD) WITH PROPOFOL;  Surgeon: Toledo, Boykin Nearing, MD;  Location: [facility] ENDOSCOPY;  Service: Gastroenterology;  Laterality: N/A;   NASAL SINUS SURGERY     x 2    PORTA CATH INSERTION N/A 04/24/2018   Procedure: PORTA CATH INSERTION;  Surgeon: Annice Needy, MD;  Location: [facility] INVASIVE CV LAB;  Service: Cardiovascular;   Laterality: N/A;   rotator cuff surgery Right    07/2016   SEPTOPLASTY     SKIN GRAFT       Family History  Problem Relation Age of Onset   Heart disease Father    Prostate cancer Father    Heart disease Paternal Grandmother    Heart attack Maternal Grandfather 32   Kidney cancer Neg Hx    Bladder Cancer Neg Hx    Other Neg Hx        pituitary abnormality      Social History   Tobacco Use   Smoking status: Every Day    Current packs/day: 3.00    Average packs/day: 3.0 packs/day for 50.0 years (150.0 ttl pk-yrs)    Types: Cigarettes    Passive exposure: Current   Smokeless tobacco: Never   Tobacco comments:    1.5 PPD khj 12/12/2023  Vaping Use   Vaping status: Never Used  Substance Use Topics   Alcohol use: Not Currently    Alcohol/week: 2.0 standard drinks of alcohol    Types: 2 Standard drinks or equivalent per week    Comment: moderate   Drug use: No     Allergies  Allergen Reactions   Lisinopril Rash   Varenicline Rash    Current Outpatient Medications  Medication Sig Dispense Refill   acetaminophen (TYLENOL) 500 MG tablet Take 500 mg by mouth daily as needed.     albuterol (PROVENTIL) (2.5 MG/3ML) 0.083% nebulizer solution Take 3 mLs (2.5 mg total) by nebulization 4 (four) times daily as needed for wheezing or shortness of breath. 75 mL 11   albuterol (VENTOLIN HFA) 108 (90 Base) MCG/ACT inhaler INHALE 2 PUFFS BY MOUTH EVERY 4 HOURS AS NEEDED FOR WHEEZE OR FOR SHORTNESS OF BREATH 6.7 each 2   ammonium lactate (LAC-HYDRIN) 12 % lotion Apply topically.     arformoterol (BROVANA) 15 MCG/2ML NEBU Take 2 mLs (15 mcg total) by nebulization 2 (two) times daily. 120 mL 0   ARIKAYCE 590 MG/8.4ML SUSP Inhale into the lungs.     atorvastatin (LIPITOR) 10 MG tablet Take 10 mg by mouth daily.     azithromycin (ZITHROMAX) 500 MG tablet TAKE 1 TABLET (500 MG TOTAL) BY MOUTH DAILY. 30 tablet 6   budesonide (PULMICORT) 0.5 MG/2ML nebulizer solution Take 2 mLs (0.5 mg  total) by nebulization 2 (two) times daily. 360 mL 3   clofazimine 50 mg CAPS capsule (for compassionate use) Take 100 mg by mouth daily with breakfast.     clotrimazole (MYCELEX) 10 MG troche Take 10 mg by mouth 3 (three) times daily.     diazepam (VALIUM) 5 MG tablet Take 5 mg by mouth  at bedtime as needed.     ethambutol (MYAMBUTOL) 400 MG tablet Take 3 tablets (1,200 mg total) by mouth daily. 90 tablet 6   ferrous sulfate 325 (65 FE) MG tablet Take 325 mg by mouth at bedtime.     Fluocinolone Acetonide 0.01 % OIL PLEASE SEE ATTACHED FOR DETAILED DIRECTIONS     folic acid (FOLVITE) 1 MG tablet Take 1 mg by mouth daily.     guaiFENesin (MUCINEX) 600 MG 12 hr tablet Take 1 tablet (600 mg total) by mouth 2 (two) times daily. 60 tablet 0   hydrocortisone (CORTEF) 10 MG tablet Take 2 tablets in the morning and 1 tablet mid afternoon (2 to 4 PM) 90 tablet 0   levothyroxine (SYNTHROID) 175 MCG tablet Take 175 mcg by mouth daily before breakfast.     lidocaine-prilocaine (EMLA) cream APPLY 1 APPLICATION TOPICALLY AS NEEDED. 30 g 1   magnesium oxide (MAG-OX) 400 MG tablet Take 800 mg by mouth 2 (two) times daily.     melatonin 3 MG TABS tablet Take 3 mg by mouth at bedtime as needed.     metoprolol succinate (TOPROL-XL) 25 MG 24 hr tablet Take 12.5-25 mg by mouth daily as needed (Acute High Blood Pressure).     naproxen sodium (ANAPROX DS) 550 MG tablet Take 1 tablet (550 mg total) by mouth 2 (two) times daily as needed for moderate pain (pain score 4-6). Take with meals 30 tablet 1   nitroGLYCERIN (NITROSTAT) 0.4 MG SL tablet Place under the tongue.     OXYGEN Inhale 2 L into the lungs at bedtime.     pantoprazole (PROTONIX) 40 MG tablet Take 1 tablet (40 mg total) by mouth 2 (two) times daily before meals     revefenacin (YUPELRI) 175 MCG/3ML nebulizer solution Take 3 mLs (175 mcg total) by nebulization daily. 90 mL 0   sodium chloride 1 g tablet Take 5 tablets (5 g total) by mouth 3 (three) times  daily with meals.     tadalafil (CIALIS) 5 MG tablet Take 1 tablet (5 mg total) by mouth daily as needed for erectile dysfunction. 90 tablet 3   amikacin (AMIKIN) 1 GM/4ML SOLN injection Inject into the muscle daily. (Patient not taking: Reported on 01/17/2024)     nicotine (NICODERM CQ - DOSED IN MG/24 HOURS) 21 mg/24hr patch Place 21 mg onto the skin daily. (Patient not taking: Reported on 01/17/2024)     promethazine-dextromethorphan (PROMETHAZINE-DM) 6.25-15 MG/5ML syrup Take 5 mLs by mouth 4 (four) times daily as needed for cough. (Patient not taking: Reported on 01/17/2024) 180 mL 1   No current facility-administered medications for this visit.   Facility-Administered Medications Ordered in Other Visits  Medication Dose Route Frequency Provider Last Rate Last Admin   heparin lock flush 100 unit/mL  500 Units Intravenous Once Orlie Dakin, Tollie Pizza, MD          REVIEW OF SYSTEMS (Negative unless checked)  Constitutional: [] Weight loss  [] Fever  [] Chills Cardiac: [] Chest pain   [] Chest pressure   [] Palpitations   [] Shortness of breath when laying flat   [] Shortness of breath at rest   [x] Shortness of breath with exertion. Vascular:  [] Pain in legs with walking   [] Pain in legs at rest   [] Pain in legs when laying flat   [] Claudication   [] Pain in feet when walking  [] Pain in feet at rest  [] Pain in feet when laying flat   [] History of DVT   [] Phlebitis   [] Swelling  in legs   [x] Varicose veins   [] Non-healing ulcers Pulmonary:   [] Uses home oxygen   [] Productive cough   [] Hemoptysis   [] Wheeze  [] COPD   [] Asthma Neurologic:  [] Dizziness  [] Blackouts   [] Seizures   [] History of stroke   [] History of TIA  [] Aphasia   [] Temporary blindness   [] Dysphagia   [] Weakness or numbness in arms   [] Weakness or numbness in legs Musculoskeletal:  [] Arthritis   [] Joint swelling   [] Joint pain   [] Low back pain Hematologic:  [] Easy bruising  [] Easy bleeding   [] Hypercoagulable state   [] Anemic   [] Hepatitis Gastrointestinal:  [] Blood in stool   [] Vomiting blood  [] Gastroesophageal reflux/heartburn   [] Abdominal pain Genitourinary:  [] Chronic kidney disease   [] Difficult urination  [] Frequent urination  [] Burning with urination   [] Hematuria Skin:  [] Rashes   [] Ulcers   [] Wounds Psychological:  [] History of anxiety   []  History of major depression.    Physical Exam BP 134/80   Pulse 94   Resp 18   Ht 6' (1.829 m)   Wt 163 lb 12.8 oz (74.3 kg)   BMI 22.22 kg/m  Gen:  WD/WN, NAD Head: [place]/AT, No temporalis wasting.  Ear/Nose/Throat: Hearing grossly intact, nares w/o erythema or drainage, oropharynx w/o Erythema/Exudate Eyes: Conjunctiva clear, sclera non-icteric  Neck: trachea midline.  No JVD.  Pulmonary:  Good air movement, respirations not labored, no use of accessory muscles  Cardiac: RRR, no JVD Vascular: Prominent varicosities are present in the right shoulder, chest, and upper arm area.  Port is in place in the right chest without erythema or drainage Vessel Right Left  Radial Palpable Palpable                                   Gastrointestinal:. No masses, surgical incisions, or scars. Musculoskeletal: M/S 5/5 throughout.  Extremities without ischemic changes.  No deformity or atrophy.  No significant lower extremity edema. Neurologic: Sensation grossly intact in extremities.  Symmetrical.  Speech is fluent. Motor exam as listed above. Psychiatric: Judgment intact, Mood & affect appropriate for pt's clinical situation. Dermatologic: No rashes or ulcers noted.  No cellulitis or open wounds.    Radiology DG ESOPHAGUS W DOUBLE CM (HD) Result Date: 12/26/2023 CLINICAL DATA:  Dysphagia for pills and liquids. Shortness of breath. History of lung cancer and MAC infection. EXAM: ESOPHOGRAM / BARIUM SWALLOW / BARIUM TABLET STUDY TECHNIQUE: Combined double contrast and single contrast examination performed using effervescent crystals, thick barium liquid, and thin  barium liquid. The patient was observed with fluoroscopy swallowing a 13 mm barium sulphate tablet. FLUOROSCOPY: Radiation Exposure Index (as provided by the fluoroscopic device): 10.1 mGy Kerma COMPARISON:  Chest CTA 11/08/2023.  Chest CT 08/09/2024. FINDINGS: The patient swallowed the barium without difficulty. Rapid sequence imaging of the pharynx in the AP and lateral projections demonstrates no laryngeal penetration or mucosal abnormalities. Patient is status post C3-4 and C5-7 ACDF. Right IJ Port-A-Cath noted. The esophageal motility is within normal limits. There is no evidence of esophageal mass, stricture or ulceration. No significant hiatal hernia. No gastroesophageal reflux was elicited with the water siphon test. At the conclusion of the study, a 13 mm barium tablet was administered. This passed without delay into the stomach. IMPRESSION: Unremarkable esophagram. No evidence of esophageal mass, stricture or ulceration. Barium tablet passed without delay into the stomach. Electronically Signed   By: Hilarie Fredrickson.D.  On: 12/26/2023 14:33    Labs Recent Results (from the past 2160 hours)  Pulmonary Function Test [facility] Only     Status: None   Collection Time: 11/08/23  1:57 PM  Result Value Ref Range   FVC-%Pred-Pre 39 %   FVC-Post 1.77 L   FVC-%Pred-Post 35 %   FVC-%Change-Post -9 %   FEV1-Pre 0.86 L   FEV1-%Pred-Pre 23 %   FEV1-Post 0.78 L   FEV1-%Pred-Post 21 %   FEV1-%Change-Post -9 %   FEV6-Pre 1.95 L   FEV6-%Pred-Pre 41 %   FEV6-Post 1.77 L   FEV6-%Pred-Post 37 %   FEV6-%Change-Post -9 %   Pre FEV1/FVC ratio 44 %   FEV1FVC-%Pred-Pre 59 %   Post FEV1/FVC ratio 44 %   FEV1FVC-%Change-Post 0 %   Pre FEV6/FVC Ratio 100 %   FEV6FVC-%Pred-Pre 105 %   Post FEV6/FVC ratio 100 %   FEV6FVC-%Pred-Post 105 %   FEF 25-75 Pre 0.42 L/sec   FEF2575-%Pred-Pre 14 %   FEF 25-75 Post 0.33 L/sec   FEF2575-%Pred-Post 11 %   FEF2575-%Change-Post -20 %   DLCO unc 13.83 ml/min/mmHg    DLCO unc % pred 48 %   DL/VA 4.09 ml/min/mmHg/L   DL/VA % pred 59 %   FVC-Pre 1.95 L  CBC with Differential     Status: Abnormal   Collection Time: 11/08/23  5:10 PM  Result Value Ref Range   WBC 9.7 4.0 - 10.5 K/uL   RBC 4.49 4.22 - 5.81 MIL/uL   Hemoglobin 14.3 13.0 - 17.0 g/dL   HCT 81.1 91.4 - 78.2 %   MCV 92.0 80.0 - 100.0 fL   MCH 31.8 26.0 - 34.0 pg   MCHC 34.6 30.0 - 36.0 g/dL   RDW 95.6 21.3 - 08.6 %   Platelets 213 150 - 400 K/uL   nRBC 0.0 0.0 - 0.2 %   Neutrophils Relative % 94 %   Neutro Abs 9.2 (H) 1.7 - 7.7 K/uL   Lymphocytes Relative 3 %   Lymphs Abs 0.3 (L) 0.7 - 4.0 K/uL   Monocytes Relative 3 %   Monocytes Absolute 0.3 0.1 - 1.0 K/uL   Eosinophils Relative 0 %   Eosinophils Absolute 0.0 0.0 - 0.5 K/uL   Basophils Relative 0 %   Basophils Absolute 0.0 0.0 - 0.1 K/uL   Immature Granulocytes 0 %   Abs Immature Granulocytes 0.02 0.00 - 0.07 K/uL    Comment: Performed at [facility], [address] [place], [place] [address]  Comprehensive metabolic panel     Status: Abnormal   Collection Time: 11/08/23  5:10 PM  Result Value Ref Range   Sodium 131 (L) 135 - 145 mmol/L   Potassium 3.9 3.5 - 5.1 mmol/L   Chloride 91 (L) 98 - 111 mmol/L   CO2 32 22 - 32 mmol/L   Glucose, Bld 103 (H) 70 - 99 mg/dL    Comment: Glucose reference range applies only to samples taken after fasting for at least 8 hours.   BUN 12 8 - 23 mg/dL   Creatinine, Ser 9.62 0.61 - 1.24 mg/dL   Calcium 8.6 (L) 8.9 - 10.3 mg/dL   Total Protein 6.3 (L) 6.5 - 8.1 g/dL   Albumin 3.8 3.5 - 5.0 g/dL   AST 19 15 - 41 U/L   ALT 17 0 - 44 U/L   Alkaline Phosphatase 77 38 - 126 U/L   Total Bilirubin 0.6 0.0 - 1.2 mg/dL   GFR, Estimated >95 >  60 mL/min    Comment: (NOTE) Calculated using the CKD-EPI Creatinine Equation (2021)    Anion gap 8 5 - 15    Comment: Performed at [facility], [address] [place], [place] [address]  Procalcitonin     Status: None   Collection  Time: 11/08/23  5:10 PM  Result Value Ref Range   Procalcitonin 0.35 ng/mL    Comment:        Interpretation: PCT (Procalcitonin) <= 0.5 ng/mL: Systemic infection (sepsis) is not likely. Local bacterial infection is possible. (NOTE)       Sepsis PCT Algorithm           Lower Respiratory Tract                                      Infection PCT Algorithm    ----------------------------     ----------------------------         PCT < 0.25 ng/mL                PCT < 0.10 ng/mL          Strongly encourage             Strongly discourage   discontinuation of antibiotics    initiation of antibiotics    ----------------------------     -----------------------------       PCT 0.25 - 0.50 ng/mL            PCT 0.10 - 0.25 ng/mL               OR       >80% decrease in PCT            Discourage initiation of                                            antibiotics      Encourage discontinuation           of antibiotics    ----------------------------     -----------------------------         PCT >= 0.50 ng/mL              PCT 0.26 - 0.50 ng/mL               AND        <80% decrease in PCT             Encourage initiation of                                             antibiotics       Encourage continuation           of antibiotics    ----------------------------     -----------------------------        PCT >= 0.50 ng/mL                  PCT > 0.50 ng/mL               AND         increase in PCT  Strongly encourage                                      initiation of antibiotics    Strongly encourage escalation           of antibiotics                                     -----------------------------                                           PCT <= 0.25 ng/mL                                                 OR                                        > 80% decrease in PCT                                      Discontinue / Do not initiate                                              antibiotics  Performed at [facility], [address] [place], [place] [address]   Blood culture (routine x 2)     Status: None   Collection Time: 11/08/23  5:10 PM   Specimen: BLOOD  Result Value Ref Range   Specimen Description BLOOD BLOOD LEFT HAND    Special Requests      BOTTLES DRAWN AEROBIC AND ANAEROBIC Blood Culture adequate volume   Culture      NO GROWTH 5 DAYS Performed at [facility], [address], [place], [place] [address]    Report Status 11/13/2023 FINAL   Troponin I (High Sensitivity)     Status: None   Collection Time: 11/08/23  5:10 PM  Result Value Ref Range   Troponin I (High Sensitivity) 7 <18 ng/L    Comment: (NOTE) Elevated high sensitivity troponin I (hsTnI) values and significant  changes across serial measurements may suggest ACS but many other  chronic and acute conditions are known to elevate hsTnI results.  Refer to the "Links" section for chest pain algorithms and additional  guidance. Performed at [facility], [address] [place], [place] [address]   HIV Antibody (routine testing w rflx)     Status: None   Collection Time: 11/08/23  5:10 PM  Result Value Ref Range   HIV Screen 4th Generation wRfx Non Reactive Non Reactive    Comment: Performed at [facility], [address], [place], [place] [address]  Lactic acid, plasma     Status: None   Collection Time: 11/08/23  5:11 PM  Result Value Ref Range  Lactic Acid, Venous 1.3 0.5 - 1.9 mmol/L    Comment: Performed at [facility], [address] [place], [place] [address]  Brain natriuretic peptide     Status: None   Collection Time: 11/08/23  5:11 PM  Result Value Ref Range   B Natriuretic Peptide 83.4 0.0 - 100.0 pg/mL    Comment: Performed at [facility], [address] [place], [place] [address]  Blood culture (routine x 2)     Status: None   Collection Time: 11/08/23  5:15 PM   Specimen: BLOOD  Result Value Ref  Range   Specimen Description BLOOD BLOOD RIGHT ARM    Special Requests      BOTTLES DRAWN AEROBIC AND ANAEROBIC Blood Culture results may not be optimal due to an inadequate volume of blood received in culture bottles   Culture      NO GROWTH 5 DAYS Performed at [facility], [address] [place], [place] [address]    Report Status 11/13/2023 FINAL   Lactic acid, plasma     Status: Abnormal   Collection Time: 11/08/23  9:41 PM  Result Value Ref Range   Lactic Acid, Venous 2.1 (HH) 0.5 - 1.9 mmol/L    Comment: CRITICAL RESULT CALLED TO, READ BACK BY AND VERIFIED WITH HEATHER [facility] 11/08/23 2212 MU Performed at [facility], [address] [place], [place] [address]   Troponin I (High Sensitivity)     Status: None   Collection Time: 11/08/23  9:41 PM  Result Value Ref Range   Troponin I (High Sensitivity) 7 <18 ng/L    Comment: (NOTE) Elevated high sensitivity troponin I (hsTnI) values and significant  changes across serial measurements may suggest ACS but many other  chronic and acute conditions are known to elevate hsTnI results.  Refer to the "Links" section for chest pain algorithms and additional  guidance. Performed at [facility], [address] [place], [place] [address]   Legionella Urine Antigen     Status: None   Collection Time: 11/08/23 11:29 PM  Result Value Ref Range   L. pneumophila Serogp 1 Ur Ag Negative Negative    Comment: (NOTE) Presumptive negative for L. pneumophila serogroup 1 antigen in urine, suggesting no recent or current infection. Legionnaires' disease cannot be ruled out since other serogroups and species may also cause disease. Performed At: [facility] [address] [place], [place] [id number] Jolene Schimke MD OV:[id number]    Source of Sample URINE, RANDOM     Comment: Performed at [facility], [address] [place], [place] [address]  Strep pneumoniae urinary antigen     Status:  None   Collection Time: 11/08/23 11:29 PM  Result Value Ref Range   Strep Pneumo Urinary Antigen NEGATIVE NEGATIVE    Comment:        Infection due to S. pneumoniae cannot be absolutely ruled out since the antigen present may be below the detection limit of the test. Performed at [facility], [address] [place], [place] [address]   Basic metabolic panel     Status: Abnormal   Collection Time: 11/09/23  5:55 AM  Result Value Ref Range   Sodium 135 135 - 145 mmol/L   Potassium 4.2 3.5 - 5.1 mmol/L   Chloride 96 (L) 98 - 111 mmol/L   CO2 29 22 - 32 mmol/L   Glucose, Bld 112 (H) 70 - 99 mg/dL    Comment: Glucose reference range applies  only to samples taken after fasting for at least 8 hours.   BUN 11 8 - 23 mg/dL   Creatinine, Ser 4.09 0.61 - 1.24 mg/dL   Calcium 8.7 (L) 8.9 - 10.3 mg/dL   GFR, Estimated >81 >19 mL/min    Comment: (NOTE) Calculated using the CKD-EPI Creatinine Equation (2021)    Anion gap 10 5 - 15    Comment: Performed at [facility], [address] [place], [place] [address]  CBC     Status: Abnormal   Collection Time: 11/09/23  5:55 AM  Result Value Ref Range   WBC 7.5 4.0 - 10.5 K/uL   RBC 4.27 4.22 - 5.81 MIL/uL   Hemoglobin 13.3 13.0 - 17.0 g/dL   HCT 95.6 (L) 21.3 - 08.6 %   MCV 91.1 80.0 - 100.0 fL   MCH 31.1 26.0 - 34.0 pg   MCHC 34.2 30.0 - 36.0 g/dL   RDW 57.8 46.9 - 62.9 %   Platelets 204 150 - 400 K/uL   nRBC 0.0 0.0 - 0.2 %    Comment: Performed at [facility], [address] [place], [place] [address]  Respiratory (~20 pathogens) panel by PCR     Status: None   Collection Time: 11/09/23 10:22 AM   Specimen: Expectorated Sputum; Respiratory  Result Value Ref Range   Adenovirus NOT DETECTED NOT DETECTED   Coronavirus 229E NOT DETECTED NOT DETECTED    Comment: (NOTE) The Coronavirus on the Respiratory Panel, DOES NOT test for the novel  Coronavirus (2019 nCoV)    Coronavirus HKU1 NOT DETECTED NOT  DETECTED   Coronavirus NL63 NOT DETECTED NOT DETECTED   Coronavirus OC43 NOT DETECTED NOT DETECTED   Metapneumovirus NOT DETECTED NOT DETECTED   Rhinovirus / Enterovirus NOT DETECTED NOT DETECTED   Influenza A NOT DETECTED NOT DETECTED   Influenza B NOT DETECTED NOT DETECTED   Parainfluenza Virus 1 NOT DETECTED NOT DETECTED   Parainfluenza Virus 2 NOT DETECTED NOT DETECTED   Parainfluenza Virus 3 NOT DETECTED NOT DETECTED   Parainfluenza Virus 4 NOT DETECTED NOT DETECTED   Respiratory Syncytial Virus NOT DETECTED NOT DETECTED   Bordetella pertussis NOT DETECTED NOT DETECTED   Bordetella Parapertussis NOT DETECTED NOT DETECTED   Chlamydophila pneumoniae NOT DETECTED NOT DETECTED   Mycoplasma pneumoniae NOT DETECTED NOT DETECTED    Comment: Performed at [facility], [address], [place], [place] [address]  Expectorated Sputum Assessment w Gram Stain, Rflx to Resp Cult     Status: None   Collection Time: 11/09/23 10:38 AM   Specimen: Sputum  Result Value Ref Range   Specimen Description SPUTUM    Special Requests Normal    Sputum evaluation      THIS SPECIMEN IS ACCEPTABLE FOR SPUTUM CULTURE Performed at [facility], [address] [place], [place] [address]    Report Status 11/09/2023 FINAL   Culture, Respiratory w Gram Stain     Status: None   Collection Time: 11/09/23 10:38 AM   Specimen: SPU  Result Value Ref Range   Specimen Description      SPUTUM Performed at [facility], [address] [place], [place] [address]    Special Requests      Normal Reflexed from [phone] Performed at [facility], [address] [place], [place] [address]    Gram Stain NO WBC SEEN RARE GRAM POSITIVE COCCI IN CHAINS     Culture      FEW  Normal respiratory flora-no Staph aureus or Pseudomonas seen Performed at [facility], [address] [place], [place] [address]    Report Status 11/11/2023 FINAL   MRSA Next Gen by PCR, Nasal      Status: None   Collection Time: 11/09/23 10:40 AM   Specimen: Expectorated Sputum; Nasal Swab  Result Value Ref Range   MRSA by PCR Next Gen NOT DETECTED NOT DETECTED    Comment: (NOTE) The GeneXpert MRSA Assay (FDA approved for NASAL specimens only), is one component of a comprehensive MRSA colonization surveillance program. It is not intended to diagnose MRSA infection nor to guide or monitor treatment for MRSA infections. Test performance is not FDA approved in patients less than 41 years old. Performed at [facility], [address] [place], [place] [address]   Basic metabolic panel     Status: Abnormal   Collection Time: 11/10/23  4:05 AM  Result Value Ref Range   Sodium 135 135 - 145 mmol/L   Potassium 3.9 3.5 - 5.1 mmol/L   Chloride 95 (L) 98 - 111 mmol/L   CO2 29 22 - 32 mmol/L   Glucose, Bld 128 (H) 70 - 99 mg/dL    Comment: Glucose reference range applies only to samples taken after fasting for at least 8 hours.   BUN 12 8 - 23 mg/dL   Creatinine, Ser 4.74 0.61 - 1.24 mg/dL   Calcium 8.5 (L) 8.9 - 10.3 mg/dL   GFR, Estimated >25 >95 mL/min    Comment: (NOTE) Calculated using the CKD-EPI Creatinine Equation (2021)    Anion gap 11 5 - 15    Comment: Performed at [facility], [address] [place], [place] [address]  Fungitell Beta-D-Glucan     Status: None   Collection Time: 11/10/23 11:04 AM  Result Value Ref Range   Result Name: Negative    Fungitell Value: <31.25 pg/mL   Reference Value: Comment     Comment: Negative: <60, Positive: >/=60   Interpretation: Notes     Comment: (NOTE) (1,3) Beta-D-Glucan was NOT DETECTED in the sample. Reasons for negative results could include the patient being in the early stage of infection before detectable levels of (1,3) Beta-D-Glucan are present. Clinical diagnosis should be made in the context of the patient's complete medical history.    Clinical Relevance: Notes     Comment: (NOTE) The  Fungitell test is indicated for presumptive diagnosis of fungal infection and should be used in conjunction with other diagnostic procedures. The test detects glucan from the following pathogens: Candida spp., Acremonium, Aspergillus spp., Coccidioides immitis, Fusarium spp., Histoplasma capsulatum, Trichosporon spp., Sporothrix schenckii, Saccharomyces cerevisiae, and Pneumocystis jiroveci. This test does not detect certain fungal species such as Cryptococcus, which produce very low levels of (1,3)-beta-D-glucan. This test will not detect the zygomycetes, such as Absidia, Blastomyces, Mucor, and Rhizopus, which are not known to produce (1,3)-beta-D-glucan. In addition, the yeast phase of Blastomyces dermatitidis produces little (1,3)-beta-D-glucan and may not be detected by the assay.    Disclaimer Notes     Comment: (NOTE) This test was developed and its performance characteristics determined by [employer]. It has not been cleared or approved by the [place] Food and Drug Administration. This laboratory is certified under the Clinical Laboratory Improvement Amendments (CLIA) and licensed by the [facility] Department of Health as qualified to perform high complexity clinical laboratory testing.    Electronically Signed By: Comment     Comment: (NOTE) Victorino Dike  Rosenthal Performed At: [facility] [address] [place], [place] [id number] Constance Holster PhD IH:[id number]   ECHOCARDIOGRAM LIMITED     Status: None   Collection Time: 11/10/23 12:35 PM  Result Value Ref Range   Weight 2,720 oz   Height 72 in   BP 135/88 mmHg   Single Plane A2C EF 38.0 %   Single Plane A4C EF 45.2 %   Calc EF 42.8 %   S' Lateral 3.50 cm   Est EF 45 - 50%   Basic metabolic panel     Status: Abnormal   Collection Time: 11/11/23  6:05 AM  Result Value Ref Range   Sodium 135 135 - 145 mmol/L   Potassium 4.2 3.5 - 5.1 mmol/L   Chloride 92 (L) 98 - 111  mmol/L   CO2 34 (H) 22 - 32 mmol/L   Glucose, Bld 109 (H) 70 - 99 mg/dL    Comment: Glucose reference range applies only to samples taken after fasting for at least 8 hours.   BUN 14 8 - 23 mg/dL   Creatinine, Ser 7.56 0.61 - 1.24 mg/dL   Calcium 8.5 (L) 8.9 - 10.3 mg/dL   GFR, Estimated >43 >32 mL/min    Comment: (NOTE) Calculated using the CKD-EPI Creatinine Equation (2021)    Anion gap 9 5 - 15    Comment: Performed at [facility], [address] [place], [place] [address]  Basic metabolic panel     Status: Abnormal   Collection Time: 11/12/23  5:57 AM  Result Value Ref Range   Sodium 133 (L) 135 - 145 mmol/L   Potassium 4.3 3.5 - 5.1 mmol/L   Chloride 94 (L) 98 - 111 mmol/L   CO2 34 (H) 22 - 32 mmol/L   Glucose, Bld 125 (H) 70 - 99 mg/dL    Comment: Glucose reference range applies only to samples taken after fasting for at least 8 hours.   BUN 13 8 - 23 mg/dL   Creatinine, Ser 4.16 0.61 - 1.24 mg/dL   Calcium 8.3 (L) 8.9 - 10.3 mg/dL   GFR, Estimated >60 >63 mL/min    Comment: (NOTE) Calculated using the CKD-EPI Creatinine Equation (2021)    Anion gap 5 5 - 15    Comment: Performed at [facility], [address] [place], [place] [address]  CBC     Status: Abnormal   Collection Time: 11/12/23  5:57 AM  Result Value Ref Range   WBC 8.7 4.0 - 10.5 K/uL   RBC 4.19 (L) 4.22 - 5.81 MIL/uL   Hemoglobin 13.2 13.0 - 17.0 g/dL   HCT 09.3 23.5 - 57.3 %   MCV 93.3 80.0 - 100.0 fL   MCH 31.5 26.0 - 34.0 pg   MCHC 33.8 30.0 - 36.0 g/dL   RDW 22.0 25.4 - 27.0 %   Platelets 180 150 - 400 K/uL   nRBC 0.0 0.0 - 0.2 %    Comment: Performed at [facility], [address] [place], [place] [address]  Basic metabolic panel     Status: Abnormal   Collection Time: 11/13/23  5:36 AM  Result Value Ref Range   Sodium 130 (L) 135 - 145 mmol/L   Potassium 4.2 3.5 - 5.1 mmol/L   Chloride 89 (L) 98 - 111 mmol/L   CO2 30 22 - 32 mmol/L   Glucose, Bld 136 (H)  70 - 99 mg/dL    Comment: Glucose reference range  applies only to samples taken after fasting for at least 8 hours.   BUN 12 8 - 23 mg/dL   Creatinine, Ser 8.46 0.61 - 1.24 mg/dL   Calcium 8.4 (L) 8.9 - 10.3 mg/dL   GFR, Estimated >96 >29 mL/min    Comment: (NOTE) Calculated using the CKD-EPI Creatinine Equation (2021)    Anion gap 11 5 - 15    Comment: Performed at [facility], [address] [place], [place] [address]  Amikacin, peak     Status: Abnormal   Collection Time: 11/13/23 12:55 PM  Result Value Ref Range   Amikacin Pk 17.7 (L) 20.0 - 30.0 ug/mL    Comment: (NOTE)                                Detection Limit = 0.8                         <0.8 indicates None Detected Performed At: [facility] [address] [place], [place] [id number] Jolene Schimke MD OZ:[id number]   Basic metabolic panel     Status: Abnormal   Collection Time: 11/14/23  6:14 AM  Result Value Ref Range   Sodium 131 (L) 135 - 145 mmol/L   Potassium 4.5 3.5 - 5.1 mmol/L   Chloride 92 (L) 98 - 111 mmol/L   CO2 31 22 - 32 mmol/L   Glucose, Bld 132 (H) 70 - 99 mg/dL    Comment: Glucose reference range applies only to samples taken after fasting for at least 8 hours.   BUN 13 8 - 23 mg/dL   Creatinine, Ser 2.59 0.61 - 1.24 mg/dL   Calcium 8.2 (L) 8.9 - 10.3 mg/dL   GFR, Estimated >56 >38 mL/min    Comment: (NOTE) Calculated using the CKD-EPI Creatinine Equation (2021)    Anion gap 8 5 - 15    Comment: Performed at [facility], [address] [place], [place] [address]  Amikacin, trough     Status: Abnormal   Collection Time: 11/14/23  6:14 AM  Result Value Ref Range   Amikacin Tr <0.8 (L) 1.0 - 8.0 ug/mL    Comment: (NOTE) **Verified by repeat analysis**                                Detection Limit = 0.8                         <0.8 indicates None Detected Performed At: [facility] [address] [place], [place] [id number] Jolene Schimke MD  [id number]   Basic metabolic panel     Status: Abnormal   Collection Time: 01/16/24  9:27 AM  Result Value Ref Range   Sodium 137 135 - 145 mmol/L   Potassium 3.7 3.5 - 5.1 mmol/L   Chloride 99 98 - 111 mmol/L   CO2 30 22 - 32 mmol/L   Glucose, Bld 88 70 - 99 mg/dL    Comment: Glucose reference range applies only to samples taken after fasting for at least 8 hours.   BUN 6 (L) 8 - 23 mg/dL   Creatinine, Ser 3.23 0.61 - 1.24 mg/dL   Calcium 8.7 (L) 8.9 - 10.3 mg/dL   GFR, Estimated >55 >73 mL/min    Comment: (NOTE) Calculated using the CKD-EPI  Creatinine Equation (2021)    Anion gap 8 5 - 15    Comment: Performed at [facility], 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  He does not have any left arm symptoms which is encouraging and hopefully this does not include the superior vena cava and is just innominate vein issue.  He has another CT scan upcoming next month to follow-up on his malignancy.  If he has no evidence of recurrent disease and no longer needs his Port-A-Cath for chemotherapy, I would consider removing this and performing a venogram with intent to treat the central venous stenosis.  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  If he needs a new Port-A-Cath, I would consider placing this in the left chest.  He and his wife are going to call us after he gets his CT scan.  Mixed hyperlipidemia lipid control important in reducing the progression of atherosclerotic disease. Continue statin therapy   Non-small cell lung cancer, right (HCC) Follows with oncology.  See treatment plan as above for his Port-A-Cath.  This was the reason the port was placed  initially.  Benign essential hypertension blood pressure control important in reducing the progression of atherosclerotic disease. On appropriate oral medications.      Festus Barren 01/17/2024, 1:03 PM   This note was created with Dragon medical transcription system.  Any errors from dictation are unintentional.

## 2024-01-17 NOTE — Assessment & Plan Note (Signed)
 lipid control important in reducing the progression of atherosclerotic disease. Continue statin therapy

## 2024-01-18 ENCOUNTER — Encounter: Attending: Pulmonary Disease | Admitting: *Deleted

## 2024-01-18 DIAGNOSIS — J449 Chronic obstructive pulmonary disease, unspecified: Secondary | ICD-10-CM | POA: Insufficient documentation

## 2024-01-18 DIAGNOSIS — F17210 Nicotine dependence, cigarettes, uncomplicated: Secondary | ICD-10-CM | POA: Insufficient documentation

## 2024-01-18 NOTE — Progress Notes (Signed)
 Initial phone call completed. Diagnosis can be found in [facility] 3/4. EP Orientation scheduled for Thursday 3/20 at 9:30.

## 2024-01-19 ENCOUNTER — Encounter

## 2024-01-19 VITALS — Ht 70.87 in | Wt 165.6 lb

## 2024-01-19 DIAGNOSIS — F17210 Nicotine dependence, cigarettes, uncomplicated: Secondary | ICD-10-CM | POA: Diagnosis not present

## 2024-01-19 DIAGNOSIS — J449 Chronic obstructive pulmonary disease, unspecified: Secondary | ICD-10-CM | POA: Diagnosis present

## 2024-01-19 NOTE — Patient Instructions (Signed)
 Patient Instructions  Patient Details  Name: Paul Carpenter MRN: [id number] Date of Birth: 1956/08/15 Referring Provider:  Epimenio Sarin, MD  Below are your personal goals for exercise, nutrition, and risk factors. Our goal is to help you stay on track towards obtaining and maintaining these goals. We will be discussing your progress on these goals with you throughout the program.  Initial Exercise Prescription:  Initial Exercise Prescription - 01/19/24 1400       Date of Initial Exercise RX and Referring Provider   Date 01/19/24    Referring Provider Dr. Kerrie Buffalo      Oxygen   Oxygen Continuous    Liters 0   As needed   Maintain Oxygen Saturation 88% or higher      Recumbant Bike   Level 1    RPM 50    Watts 25    Minutes 15    METs 2.3      NuStep   Level 1    SPM 80    Minutes 15    METs 2.3      REL-XR   Level 1    Watts 25    Speed 25    Minutes 15    METs 2.3      Intensity   THRR 40-80% of Max Heartrate 117-141    Ratings of Perceived Exertion 11-13    Perceived Dyspnea 0-4      Resistance Training   Training Prescription Yes    Weight 4lb    Reps 10-15             Exercise Goals: Frequency: Be able to perform aerobic exercise two to three times per week in program working toward 2-5 days per week of home exercise.  Intensity: Work with a perceived exertion of 11 (fairly light) - 15 (hard) while following your exercise prescription.  We will make changes to your prescription with you as you progress through the program.   Duration: Be able to do 30 to 45 minutes of continuous aerobic exercise in addition to a 5 minute warm-up and a 5 minute cool-down routine.   Nutrition Goals: Your personal nutrition goals will be established when you do your nutrition analysis with the dietician.  The following are general nutrition guidelines to follow: Cholesterol < 200mg /day Sodium < 1500mg /day Fiber: Men over 50 yrs - 30 grams per  day  Personal Goals:  Personal Goals and Risk Factors at Admission - 01/18/24 1037       Core Components/Risk Factors/Patient Goals on Admission   Improve shortness of breath with ADL's Yes    Intervention Provide education, individualized exercise plan and daily activity instruction to help decrease symptoms of SOB with activities of daily living.    Expected Outcomes Short Term: Improve cardiorespiratory fitness to achieve a reduction of symptoms when performing ADLs;Long Term: Be able to perform more ADLs without symptoms or delay the onset of symptoms    Hypertension Yes   Medication to take if it gets too low or if it is too high   Intervention Provide education on lifestyle modifcations including regular physical activity/exercise, weight management, moderate sodium restriction and increased consumption of fresh fruit, vegetables, and low fat dairy, alcohol moderation, and smoking cessation.;Monitor prescription use compliance.    Expected Outcomes Short Term: Continued assessment and intervention until BP is < 140/34mm HG in hypertensive participants. < 130/21mm HG in hypertensive participants with diabetes, heart failure or chronic kidney disease.;Long Term: Maintenance of blood pressure  at goal levels.    Lipids Yes    Intervention Provide education and support for participant on nutrition & aerobic/resistive exercise along with prescribed medications to achieve LDL 70mg , HDL >40mg .    Expected Outcomes Short Term: Participant states understanding of desired cholesterol values and is compliant with medications prescribed. Participant is following exercise prescription and nutrition guidelines.;Long Term: Cholesterol controlled with medications as prescribed, with individualized exercise RX and with personalized nutrition plan. Value goals: LDL < 70mg , HDL > 40 mg.             Exercise Goals and Review:  Exercise Goals     Row Name 01/19/24 1424             Exercise Goals    Increase Physical Activity Yes       Intervention Provide advice, education, support and counseling about physical activity/exercise needs.;Develop an individualized exercise prescription for aerobic and resistive training based on initial evaluation findings, risk stratification, comorbidities and participant's personal goals.       Expected Outcomes Short Term: Attend rehab on a regular basis to increase amount of physical activity.;Long Term: Exercising regularly at least 3-5 days a week.;Long Term: Add in home exercise to make exercise part of routine and to increase amount of physical activity.       Increase Strength and Stamina Yes       Intervention Provide advice, education, support and counseling about physical activity/exercise needs.;Develop an individualized exercise prescription for aerobic and resistive training based on initial evaluation findings, risk stratification, comorbidities and participant's personal goals.       Expected Outcomes Short Term: Increase workloads from initial exercise prescription for resistance, speed, and METs.;Short Term: Perform resistance training exercises routinely during rehab and add in resistance training at home;Long Term: Improve cardiorespiratory fitness, muscular endurance and strength as measured by increased METs and functional capacity ( )       Able to understand and use rate of perceived exertion (RPE) scale Yes       Intervention Provide education and explanation on how to use RPE scale       Expected Outcomes Short Term: Able to use RPE daily in rehab to express subjective intensity level;Long Term:  Able to use RPE to guide intensity level when exercising independently       Able to understand and use Dyspnea scale Yes       Intervention Provide education and explanation on how to use Dyspnea scale       Expected Outcomes Short Term: Able to use Dyspnea scale daily in rehab to express subjective sense of shortness of breath during  exertion;Long Term: Able to use Dyspnea scale to guide intensity level when exercising independently       Knowledge and understanding of Target Heart Rate Range (THRR) Yes       Intervention Provide education and explanation of THRR including how the numbers were predicted and where they are located for reference       Expected Outcomes Short Term: Able to state/look up THRR;Short Term: Able to use daily as guideline for intensity in rehab;Long Term: Able to use THRR to govern intensity when exercising independently       Able to check pulse independently Yes       Intervention Provide education and demonstration on how to check pulse in carotid and radial arteries.;Review the importance of being able to check your own pulse for safety during independent exercise       Expected  Outcomes Long Term: Able to check pulse independently and accurately;Short Term: Able to explain why pulse checking is important during independent exercise       Understanding of Exercise Prescription Yes       Intervention Provide education, explanation, and written materials on patient's individual exercise prescription       Expected Outcomes Short Term: Able to explain program exercise prescription;Long Term: Able to explain home exercise prescription to exercise independently                Copy of goals given to participant.

## 2024-01-19 NOTE — Progress Notes (Signed)
 Pulmonary Individual Treatment Plan  Patient Details  Name: Paul Carpenter MRN: [id number] Date of Birth: 1956-07-10 Referring Provider:   Flowsheet Row Pulmonary Rehab from 01/19/2024 in [facility] Cardiac and Pulmonary Rehab  Referring Provider Dr. Kerrie Buffalo       Initial Encounter Date:  Flowsheet Row Pulmonary Rehab from 01/19/2024 in [facility] Cardiac and Pulmonary Rehab  Date 01/19/24       Visit Diagnosis: Chronic obstructive pulmonary disease, unspecified COPD type (HCC)  Patient's Home Medications on Admission:  Current Outpatient Medications:    acetaminophen (TYLENOL) 500 MG tablet, Take 500 mg by mouth daily as needed., Disp: , Rfl:    albuterol (PROVENTIL) (2.5 MG/3ML) 0.083% nebulizer solution, Take 3 mLs (2.5 mg total) by nebulization 4 (four) times daily as needed for wheezing or shortness of breath., Disp: 75 mL, Rfl: 11   albuterol (VENTOLIN HFA) 108 (90 Base) MCG/ACT inhaler, INHALE 2 PUFFS BY MOUTH EVERY 4 HOURS AS NEEDED FOR WHEEZE OR FOR SHORTNESS OF BREATH, Disp: 6.7 each, Rfl: 2   amikacin (AMIKIN) 1 GM/4ML SOLN injection, Inject into the muscle daily. (Patient not taking: Reported on 01/17/2024), Disp: , Rfl:    ammonium lactate (LAC-HYDRIN) 12 % lotion, Apply topically., Disp: , Rfl:    arformoterol (BROVANA) 15 MCG/2ML NEBU, Take 2 mLs (15 mcg total) by nebulization 2 (two) times daily., Disp: 120 mL, Rfl: 0   ARIKAYCE 590 MG/8.4ML SUSP, Inhale into the lungs., Disp: , Rfl:    atorvastatin (LIPITOR) 10 MG tablet, Take 10 mg by mouth daily., Disp: , Rfl:    azithromycin (ZITHROMAX) 500 MG tablet, TAKE 1 TABLET (500 MG TOTAL) BY MOUTH DAILY., Disp: 30 tablet, Rfl: 6   budesonide (PULMICORT) 0.5 MG/2ML nebulizer solution, Take 2 mLs (0.5 mg total) by nebulization 2 (two) times daily., Disp: 360 mL, Rfl: 3   clofazimine 50 mg CAPS capsule (for compassionate use), Take 100 mg by mouth daily with breakfast., Disp: , Rfl:    clotrimazole (MYCELEX) 10 MG troche, Take 10  mg by mouth 3 (three) times daily., Disp: , Rfl:    diazepam (VALIUM) 5 MG tablet, Take 5 mg by mouth at bedtime as needed., Disp: , Rfl:    ethambutol (MYAMBUTOL) 400 MG tablet, Take 3 tablets (1,200 mg total) by mouth daily., Disp: 90 tablet, Rfl: 6   ferrous sulfate 325 (65 FE) MG tablet, Take 325 mg by mouth at bedtime., Disp: , Rfl:    Fluocinolone Acetonide 0.01 % OIL, PLEASE SEE ATTACHED FOR DETAILED DIRECTIONS, Disp: , Rfl:    folic acid (FOLVITE) 1 MG tablet, Take 1 mg by mouth daily., Disp: , Rfl:    guaiFENesin (MUCINEX) 600 MG 12 hr tablet, Take 1 tablet (600 mg total) by mouth 2 (two) times daily., Disp: 60 tablet, Rfl: 0   hydrocortisone (CORTEF) 10 MG tablet, Take 2 tablets in the morning and 1 tablet mid afternoon (2 to 4 PM), Disp: 90 tablet, Rfl: 0   levothyroxine (SYNTHROID) 175 MCG tablet, Take 175 mcg by mouth daily before breakfast., Disp: , Rfl:    lidocaine-prilocaine (EMLA) cream, APPLY 1 APPLICATION TOPICALLY AS NEEDED., Disp: 30 g, Rfl: 1   magnesium oxide (MAG-OX) 400 MG tablet, Take 800 mg by mouth 2 (two) times daily., Disp: , Rfl:    melatonin 3 MG TABS tablet, Take 3 mg by mouth at bedtime as needed., Disp: , Rfl:    metoprolol succinate (TOPROL-XL) 25 MG 24 hr tablet, Take 12.5-25 mg by mouth daily as  needed (Acute High Blood Pressure)., Disp: , Rfl:    naproxen sodium (ANAPROX DS) 550 MG tablet, Take 1 tablet (550 mg total) by mouth 2 (two) times daily as needed for moderate pain (pain score 4-6). Take with meals, Disp: 30 tablet, Rfl: 1   nicotine (NICODERM CQ - DOSED IN MG/24 HOURS) 21 mg/24hr patch, Place 21 mg onto the skin daily. (Patient not taking: Reported on 01/17/2024), Disp: , Rfl:    nitroGLYCERIN (NITROSTAT) 0.4 MG SL tablet, Place under the tongue., Disp: , Rfl:    OXYGEN, Inhale 2 L into the lungs at bedtime., Disp: , Rfl:    pantoprazole (PROTONIX) 40 MG tablet, Take 1 tablet (40 mg total) by mouth 2 (two) times daily before meals, Disp: , Rfl:     promethazine-dextromethorphan (PROMETHAZINE-DM) 6.25-15 MG/5ML syrup, Take 5 mLs by mouth 4 (four) times daily as needed for cough. (Patient not taking: Reported on 01/17/2024), Disp: 180 mL, Rfl: 1   revefenacin (YUPELRI) 175 MCG/3ML nebulizer solution, Take 3 mLs (175 mcg total) by nebulization daily., Disp: 90 mL, Rfl: 0   sodium chloride 1 g tablet, Take 5 tablets (5 g total) by mouth 3 (three) times daily with meals., Disp: , Rfl:    tadalafil (CIALIS) 5 MG tablet, Take 1 tablet (5 mg total) by mouth daily as needed for erectile dysfunction., Disp: 90 tablet, Rfl: 3 No current facility-administered medications for this visit.  Facility-Administered Medications Ordered in Other Visits:    heparin lock flush 100 unit/mL, 500 Units, Intravenous, Once, [name], Tollie Pizza, MD  Past Medical History: Past Medical History:  Diagnosis Date   Anginal pain (HCC)    Anxiety    Asthma    Chest pain    CHF (congestive heart failure) (HCC)    Chicken pox    Complication of anesthesia    o2 dropped after neck fusion   COPD (chronic obstructive pulmonary disease) (HCC)    Coronary artery disease    Cough    chronic  clear phlegm   Dysrhythmia    palpitations   GERD (gastroesophageal reflux disease)    h/o reflux/ hoarsness   Hematochezia    Hemorrhoids    History of chickenpox    History of colon polyps    History of Helicobacter pylori infection    Hoarseness    Hypertension    Lung cancer (HCC) 05/2016   Chemo + rad tx's.    Migraines    OSA (obstructive sleep apnea)    has CPAP but does not use   Personal history of tobacco use, presenting hazards to health 03/05/2016   Pneumonia    5/17   Raynaud disease    Raynaud disease    Raynaud's disease    Rotator cuff tear    on right   Shortness of breath dyspnea    Sleep apnea    Ulcer (traumatic) of oral mucosa     Tobacco Use: Social History   Tobacco Use  Smoking Status Every Day   Current packs/day: 3.00   Average  packs/day: 3.0 packs/day for 50.0 years (150.0 ttl pk-yrs)   Types: Cigarettes   Passive exposure: Current  Smokeless Tobacco Never  Tobacco Comments   1.5 PPD khj 12/12/2023    Labs: Review Flowsheet        No data to display           Pulmonary Assessment Scores:   UCSD: Self-administered rating of dyspnea associated with activities of daily living (ADLs) 6-point  scale (0 = "not at all" to 5 = "maximal or unable to do because of breathlessness")  Scoring Scores range from 0 to 120.  Minimally important difference is 5 units  CAT: CAT can identify the health impairment of COPD patients and is better correlated with disease progression.  CAT has a scoring range of zero to 40. The CAT score is classified into four groups of low (less than 10), medium (10 - 20), high (21-30) and very high (31-40) based on the impact level of disease on health status. A CAT score over 10 suggests significant symptoms.  A worsening CAT score could be explained by an exacerbation, poor medication adherence, poor inhaler technique, or progression of COPD or comorbid conditions.  CAT MCID is 2 points  mMRC: mMRC (Modified Medical Research Council) Dyspnea Scale is used to assess the degree of baseline functional disability in patients of respiratory disease due to dyspnea. No minimal important difference is established. A decrease in score of 1 point or greater is considered a positive change.   Pulmonary Function Assessment:   Exercise Target Goals: Exercise Program Goal: Individual exercise prescription set using results from initial 6 min walk test and THRR while considering  patient's activity barriers and safety.   Exercise Prescription Goal: Initial exercise prescription builds to 30-45 minutes a day of aerobic activity, 2-3 days per week.  Home exercise guidelines will be given to patient during program as part of exercise prescription that the participant will acknowledge.  Education:  Aerobic Exercise: - Group verbal and visual presentation on the components of exercise prescription. Introduces F.I.T.T principle from ACSM for exercise prescriptions.  Reviews F.I.T.T. principles of aerobic exercise including progression. Written material given at graduation.   Education: Resistance Exercise: - Group verbal and visual presentation on the components of exercise prescription. Introduces F.I.T.T principle from ACSM for exercise prescriptions  Reviews F.I.T.T. principles of resistance exercise including progression. Written material given at graduation.    Education: Exercise & Equipment Safety: - Individual verbal instruction and demonstration of equipment use and safety with use of the equipment. Flowsheet Row Pulmonary Rehab from 01/19/2024 in [facility] Cardiac and Pulmonary Rehab  Date 01/19/24  Educator [facility]  Instruction Review Code 1- Verbalizes Understanding       Education: Exercise Physiology & General Exercise Guidelines: - Group verbal and written instruction with models to review the exercise physiology of the cardiovascular system and associated critical values. Provides general exercise guidelines with specific guidelines to those with heart or lung disease.    Education: Flexibility, Balance, Mind/Body Relaxation: - Group verbal and visual presentation with interactive activity on the components of exercise prescription. Introduces F.I.T.T principle from ACSM for exercise prescriptions. Reviews F.I.T.T. principles of flexibility and balance exercise training including progression. Also discusses the mind body connection.  Reviews various relaxation techniques to help reduce and manage stress (i.e. Deep breathing, progressive muscle relaxation, and visualization). Balance handout provided to take home. Written material given at graduation.   Activity Barriers & Risk Stratification:  Activity Barriers & Cardiac Risk Stratification - 01/19/24 1420       Activity  Barriers & Cardiac Risk Stratification   Activity Barriers Deconditioning;Muscular Weakness;Shortness of Breath             6 Minute Walk:  6 Minute Walk     Row Name 01/19/24 1417         6 Minute Walk   Phase Initial     Distance 625 feet     Walk  Time 6 minutes     # of Rest Breaks 0     MPH 1.18     METS 2.3     RPE 15     Perceived Dyspnea  3     VO2 Peak 7.97     Symptoms No     Resting HR 93 bpm     Resting BP 112/58     Resting Oxygen Saturation  94 %     Exercise Oxygen Saturation  during 6 min walk 92 %     Max Ex. HR 105 bpm     Max Ex. BP 124/64     2 Minute Post BP 114/62       Interval HR   1 Minute HR 101     2 Minute HR 102     3 Minute HR 104     4 Minute HR 105     5 Minute HR 104     6 Minute HR 97     2 Minute Post HR 97     Interval Heart Rate? Yes       Interval Oxygen   Interval Oxygen? Yes     Baseline Oxygen Saturation % 94 %     1 Minute Oxygen Saturation % 94 %     1 Minute Liters of Oxygen 0 L     2 Minute Oxygen Saturation % 94 %     2 Minute Liters of Oxygen 0 L     3 Minute Oxygen Saturation % 94 %     3 Minute Liters of Oxygen 0 L     4 Minute Oxygen Saturation % 92 %     4 Minute Liters of Oxygen 0 L     5 Minute Oxygen Saturation % 92 %     5 Minute Liters of Oxygen 0 L     6 Minute Oxygen Saturation % 96 %     6 Minute Liters of Oxygen 0 L     2 Minute Post Oxygen Saturation % 95 %     2 Minute Post Liters of Oxygen 0 L             Oxygen Initial Assessment:  Oxygen Initial Assessment - 01/19/24 1426       Home Oxygen   Home Oxygen Device Home Concentrator    Sleep Oxygen Prescription Continuous    Liters per minute 3    Home Exercise Oxygen Prescription Continuous    Liters per minute 3    Home Resting Oxygen Prescription None      Initial 6 min Walk   Oxygen Used None      Program Oxygen Prescription   Program Oxygen Prescription None      Intervention   Short Term Goals To learn and exhibit  compliance with exercise, home and travel O2 prescription;To learn and understand importance of monitoring SPO2 with pulse oximeter and demonstrate accurate use of the pulse oximeter.;To learn and understand importance of maintaining oxygen saturations>88%;To learn and demonstrate proper pursed lip breathing techniques or other breathing techniques. ;To learn and demonstrate proper use of respiratory medications    Long  Term Goals Exhibits compliance with exercise, home  and travel O2 prescription;Verbalizes importance of monitoring SPO2 with pulse oximeter and return demonstration;Maintenance of O2 saturations>88%;Compliance with respiratory medication;Exhibits proper breathing techniques, such as pursed lip breathing or other method taught during program session;Demonstrates proper use of MDI's  Oxygen Re-Evaluation:   Oxygen Discharge (Final Oxygen Re-Evaluation):   Initial Exercise Prescription:  Initial Exercise Prescription - 01/19/24 1400       Date of Initial Exercise RX and Referring Provider   Date 01/19/24    Referring Provider Dr. Kerrie Buffalo      Oxygen   Oxygen Continuous    Liters 0   As needed   Maintain Oxygen Saturation 88% or higher      Recumbant Bike   Level 1    RPM 50    Watts 25    Minutes 15    METs 2.3      NuStep   Level 1    SPM 80    Minutes 15    METs 2.3      REL-XR   Level 1    Watts 25    Speed 25    Minutes 15    METs 2.3      Intensity   THRR 40-80% of Max Heartrate 117-141    Ratings of Perceived Exertion 11-13    Perceived Dyspnea 0-4      Resistance Training   Training Prescription Yes    Weight 4lb    Reps 10-15             Perform Capillary Blood Glucose checks as needed.  Exercise Prescription Changes:   Exercise Prescription Changes     Row Name 01/19/24 1400             Response to Exercise   Blood Pressure (Admit) 112/58       Blood Pressure (Exercise) 124/64       Blood Pressure  (Exit) 114/62       Heart Rate (Admit) 93 bpm       Heart Rate (Exercise) 105 bpm       Heart Rate (Exit) 97 bpm       Oxygen Saturation (Admit) 94 %       Oxygen Saturation (Exercise) 92 %       Oxygen Saturation (Exit) 95 %       Rating of Perceived Exertion (Exercise) 15       Perceived Dyspnea (Exercise) 3       Symptoms none       Comments results                Exercise Comments:   Exercise Goals and Review:   Exercise Goals     Row Name 01/19/24 1424             Exercise Goals   Increase Physical Activity Yes       Intervention Provide advice, education, support and counseling about physical activity/exercise needs.;Develop an individualized exercise prescription for aerobic and resistive training based on initial evaluation findings, risk stratification, comorbidities and participant's personal goals.       Expected Outcomes Short Term: Attend rehab on a regular basis to increase amount of physical activity.;Long Term: Exercising regularly at least 3-5 days a week.;Long Term: Add in home exercise to make exercise part of routine and to increase amount of physical activity.       Increase Strength and Stamina Yes       Intervention Provide advice, education, support and counseling about physical activity/exercise needs.;Develop an individualized exercise prescription for aerobic and resistive training based on initial evaluation findings, risk stratification, comorbidities and participant's personal goals.       Expected Outcomes Short Term: Increase workloads from initial exercise prescription  for resistance, speed, and METs.;Short Term: Perform resistance training exercises routinely during rehab and add in resistance training at home;Long Term: Improve cardiorespiratory fitness, muscular endurance and strength as measured by increased METs and functional capacity ( )       Able to understand and use rate of perceived exertion (RPE) scale Yes       Intervention  Provide education and explanation on how to use RPE scale       Expected Outcomes Short Term: Able to use RPE daily in rehab to express subjective intensity level;Long Term:  Able to use RPE to guide intensity level when exercising independently       Able to understand and use Dyspnea scale Yes       Intervention Provide education and explanation on how to use Dyspnea scale       Expected Outcomes Short Term: Able to use Dyspnea scale daily in rehab to express subjective sense of shortness of breath during exertion;Long Term: Able to use Dyspnea scale to guide intensity level when exercising independently       Knowledge and understanding of Target Heart Rate Range (THRR) Yes       Intervention Provide education and explanation of THRR including how the numbers were predicted and where they are located for reference       Expected Outcomes Short Term: Able to state/look up THRR;Short Term: Able to use daily as guideline for intensity in rehab;Long Term: Able to use THRR to govern intensity when exercising independently       Able to check pulse independently Yes       Intervention Provide education and demonstration on how to check pulse in carotid and radial arteries.;Review the importance of being able to check your own pulse for safety during independent exercise       Expected Outcomes Long Term: Able to check pulse independently and accurately;Short Term: Able to explain why pulse checking is important during independent exercise       Understanding of Exercise Prescription Yes       Intervention Provide education, explanation, and written materials on patient's individual exercise prescription       Expected Outcomes Short Term: Able to explain program exercise prescription;Long Term: Able to explain home exercise prescription to exercise independently                Exercise Goals Re-Evaluation :   Discharge Exercise Prescription (Final Exercise Prescription Changes):  Exercise  Prescription Changes - 01/19/24 1400       Response to Exercise   Blood Pressure (Admit) 112/58    Blood Pressure (Exercise) 124/64    Blood Pressure (Exit) 114/62    Heart Rate (Admit) 93 bpm    Heart Rate (Exercise) 105 bpm    Heart Rate (Exit) 97 bpm    Oxygen Saturation (Admit) 94 %    Oxygen Saturation (Exercise) 92 %    Oxygen Saturation (Exit) 95 %    Rating of Perceived Exertion (Exercise) 15    Perceived Dyspnea (Exercise) 3    Symptoms none    Comments results             Nutrition:  Target Goals: Understanding of nutrition guidelines, daily intake of sodium 1500mg , cholesterol 200mg , calories 30% from fat and 7% or less from saturated fats, daily to have 5 or more servings of fruits and vegetables.  Education: All About Nutrition: -Group instruction provided by verbal, written material, interactive activities, discussions, models, and posters to present  general guidelines for heart healthy nutrition including fat, fiber, MyPlate, the role of sodium in heart healthy nutrition, utilization of the nutrition label, and utilization of this knowledge for meal planning. Follow up email sent as well. Written material given at graduation.   Biometrics:  Pre Biometrics - 01/19/24 1424       Pre Biometrics   Height 5' 10.87" (1.8 m)    Weight 165 lb 9.6 oz (75.1 kg)    Waist Circumference 36 inches    Hip Circumference 34.5 inches    Waist to Hip Ratio 1.04 %    BMI (Calculated) 23.18    Single Leg Stand 5.5 seconds              Nutrition Therapy Plan and Nutrition Goals:   Nutrition Assessments:  MEDIFICTS Score Key: >=70 Need to make dietary changes  40-70 Heart Healthy Diet <= 40 Therapeutic Level Cholesterol Diet  Flowsheet Row Pulmonary Rehab from 01/18/2024 in [facility] Cardiac and Pulmonary Rehab  Picture Your Plate Total Score on Admission 39      Picture Your Plate Scores: <64 Unhealthy dietary pattern with much room for improvement. 41-50  Dietary pattern unlikely to meet recommendations for good health and room for improvement. 51-60 More healthful dietary pattern, with some room for improvement.  >60 Healthy dietary pattern, although there may be some specific behaviors that could be improved.   Nutrition Goals Re-Evaluation:   Nutrition Goals Discharge (Final Nutrition Goals Re-Evaluation):   Psychosocial: Target Goals: Acknowledge presence or absence of significant depression and/or stress, maximize coping skills, provide positive support system. Participant is able to verbalize types and ability to use techniques and skills needed for reducing stress and depression.   Education: Stress, Anxiety, and Depression - Group verbal and visual presentation to define topics covered.  Reviews how body is impacted by stress, anxiety, and depression.  Also discusses healthy ways to reduce stress and to treat/manage anxiety and depression.  Written material given at graduation.   Education: Sleep Hygiene -Provides group verbal and written instruction about how sleep can affect your health.  Define sleep hygiene, discuss sleep cycles and impact of sleep habits. Review good sleep hygiene tips.    Initial Review & Psychosocial Screening:  Initial Psych Review & Screening - 01/18/24 1039       Initial Review   Current issues with Current Stress Concerns;Current Sleep Concerns    Source of Stress Concerns Chronic Illness;Unable to participate in former interests or hobbies;Unable to perform yard/household activities      Family Dynamics   Good Support System? Yes   family     Barriers   Psychosocial barriers to participate in program The patient should benefit from training in stress management and relaxation.      Screening Interventions   Interventions Encouraged to exercise;Provide feedback about the scores to participant;To provide support and resources with identified psychosocial needs    Expected Outcomes Short Term  goal: Utilizing psychosocial counselor, staff and physician to assist with identification of specific Stressors or current issues interfering with healing process. Setting desired goal for each stressor or current issue identified.;Long Term Goal: Stressors or current issues are controlled or eliminated.;Short Term goal: Identification and review with participant of any Quality of Life or Depression concerns found by scoring the questionnaire.;Long Term goal: The participant improves quality of Life and PHQ9 Scores as seen by post scores and/or verbalization of changes             Quality of  Life Scores:  Scores of 19 and below usually indicate a poorer quality of life in these areas.  A difference of  2-3 points is a clinically meaningful difference.  A difference of 2-3 points in the total score of the Quality of Life Index has been associated with significant improvement in overall quality of life, self-image, physical symptoms, and general health in studies assessing change in quality of life.  PHQ-9: Review Flowsheet  More data exists      03/10/2023 10/05/2022 07/22/2022 07/20/2022 05/06/2022  Depression screen PHQ 2/9  Decreased Interest 0 0 1 0 0  Down, Depressed, Hopeless 1 1 1  0 0  PHQ - 2 Score 1 1 2  0 0  Altered sleeping - - 1 - -  Tired, decreased energy - - 2 - -  Change in appetite - - 3 - -  Feeling bad or failure about yourself  - - 0 - -  Trouble concentrating - - 0 - -  Moving slowly or fidgety/restless - - 0 - -  Suicidal thoughts - - 0 - -  PHQ-9 Score - - 8 - -  Difficult doing work/chores - - Somewhat difficult - -   Interpretation of Total Score  Total Score Depression Severity:  1-4 = Minimal depression, 5-9 = Mild depression, 10-14 = Moderate depression, 15-19 = Moderately severe depression, 20-27 = Severe depression   Psychosocial Evaluation and Intervention:  Psychosocial Evaluation - 01/18/24 1057       Psychosocial Evaluation & Interventions    Interventions Encouraged to exercise with the program and follow exercise prescription;Stress management education;Relaxation education    Comments Mr. Olenik is coming to pulmonary rehab with COPD. 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 He said he takes 41 pills and nebulizer treatments that wear him out. He is tired and unable to do what he used to do. He would like to get out to his job sites more, but is limited due to breathing and appointments. He currently is dealing with an infection around is port site which his doctors are managing. He wears oxygen as needed, but sometimes still struggles to keep it up during the night which alters his sleep. His wife and children are his main support system. He enjoys gardening when he has the time    Expected Outcomes Short: attend pulmonary rehab for educaiton and exercise Long: develop and maintain positive self care habits    Continue Psychosocial Services  Follow up required by staff             Psychosocial Re-Evaluation:   Psychosocial Discharge (Final Psychosocial Re-Evaluation):   Education: Education Goals: Education classes will be provided on a weekly basis, covering required topics. Participant will state understanding/return demonstration of topics presented.  Learning Barriers/Preferences:  Learning Barriers/Preferences - 01/18/24 1038       Learning Barriers/Preferences   Learning Barriers None    Learning Preferences None             General Pulmonary Education Topics:  Infection Prevention: - Provides verbal and written material to individual with discussion of infection control including proper hand washing and proper equipment cleaning during exercise session. Flowsheet Row Pulmonary Rehab from 01/19/2024 in [facility] Cardiac and Pulmonary Rehab  Date 01/19/24  Educator [facility]  Instruction  Review Code 1- Verbalizes Understanding  Falls Prevention: - Provides verbal and written material to individual with discussion of falls prevention and safety. Flowsheet Row Pulmonary Rehab from 01/19/2024 in [facility] Cardiac and Pulmonary Rehab  Date 01/19/24  Educator [facility]  Instruction Review Code 1- Verbalizes Understanding       Chronic Lung Disease Review: - Group verbal instruction with posters, models, PowerPoint presentations and videos,  to review new updates, new respiratory medications, new advancements in procedures and treatments. Providing information on websites and "800" numbers for continued self-education. Includes information about supplement oxygen, available portable oxygen systems, continuous and intermittent flow rates, oxygen safety, concentrators, and Medicare reimbursement for oxygen. Explanation of Pulmonary Drugs, including class, frequency, complications, importance of spacers, rinsing mouth after steroid MDI's, and proper cleaning methods for nebulizers. Review of basic lung anatomy and physiology related to function, structure, and complications of lung disease. Review of risk factors. Discussion about methods for diagnosing sleep apnea and types of masks and machines for OSA. Includes a review of the use of types of environmental controls: home humidity, furnaces, filters, dust mite/pet prevention, HEPA vacuums. Discussion about weather changes, air quality and the benefits of nasal washing. Instruction on Warning signs, infection symptoms, calling MD promptly, preventive modes, and value of vaccinations. Review of effective airway clearance, coughing and/or vibration techniques. Emphasizing that all should Create an Action Plan. Written material given at graduation.   AED/CPR: - Group verbal and written instruction with the use of models to demonstrate the basic use of the AED with the basic ABC's of resuscitation.    Anatomy and Cardiac Procedures: - Group  verbal and visual presentation and models provide information about basic cardiac anatomy and function. Reviews the testing methods done to diagnose heart disease and the outcomes of the test results. Describes the treatment choices: Medical Management, Angioplasty, or Coronary Bypass Surgery for treating various heart conditions including Myocardial Infarction, Angina, Valve Disease, and Cardiac Arrhythmias.  Written material given at graduation.   Medication Safety: - Group verbal and visual instruction to review commonly prescribed medications for heart and lung disease. Reviews the medication, class of the drug, and side effects. Includes the steps to properly store meds and maintain the prescription regimen.  Written material given at graduation.   Other: -Provides group and verbal instruction on various topics (see comments)   Knowledge Questionnaire Score:    Core Components/Risk Factors/Patient Goals at Admission:  Personal Goals and Risk Factors at Admission - 01/18/24 1037       Core Components/Risk Factors/Patient Goals on Admission   Improve shortness of breath with ADL's Yes    Intervention Provide education, individualized exercise plan and daily activity instruction to help decrease symptoms of SOB with activities of daily living.    Expected Outcomes Short Term: Improve cardiorespiratory fitness to achieve a reduction of symptoms when performing ADLs;Long Term: Be able to perform more ADLs without symptoms or delay the onset of symptoms    Hypertension Yes   Medication to take if it gets too low or if it is too high   Intervention Provide education on lifestyle modifcations including regular physical activity/exercise, weight management, moderate sodium restriction and increased consumption of fresh fruit, vegetables, and low fat dairy, alcohol moderation, and smoking cessation.;Monitor prescription use compliance.    Expected Outcomes Short Term: Continued assessment and  intervention until BP is < 140/70mm HG in hypertensive participants. < 130/13mm HG in hypertensive participants with diabetes, heart failure or chronic kidney disease.;Long Term: Maintenance of blood pressure at goal levels.  Lipids Yes    Intervention Provide education and support for participant on nutrition & aerobic/resistive exercise along with prescribed medications to achieve LDL 70mg , HDL >40mg .    Expected Outcomes Short Term: Participant states understanding of desired cholesterol values and is compliant with medications prescribed. Participant is following exercise prescription and nutrition guidelines.;Long Term: Cholesterol controlled with medications as prescribed, with individualized exercise RX and with personalized nutrition plan. Value goals: LDL < 70mg , HDL > 40 mg.             Education:Diabetes - Individual verbal and written instruction to review signs/symptoms of diabetes, desired ranges of glucose level fasting, after meals and with exercise. Acknowledge that pre and post exercise glucose checks will be done for 3 sessions at entry of program.   Know Your Numbers and Heart Failure: - Group verbal and visual instruction to discuss disease risk factors for cardiac and pulmonary disease and treatment options.  Reviews associated critical values for Overweight/Obesity, Hypertension, Cholesterol, and Diabetes.  Discusses basics of heart failure: signs/symptoms and treatments.  Introduces Heart Failure Zone chart for action plan for heart failure.  Written material given at graduation.   Core Components/Risk Factors/Patient Goals Review:    Core Components/Risk Factors/Patient Goals at Discharge (Final Review):    ITP Comments:  ITP Comments     Row Name 01/18/24 1055 01/19/24 1417         ITP Comments Initial phone call completed. Diagnosis can be found in [facility] 3/4. EP Orientation scheduled for Thursday 3/20 at 9:30. Completed and gym orientation. Initial ITP  created and sent for review to Dr. Jinny Sanders, Medical Director.               Comments: Initial ITP

## 2024-01-25 ENCOUNTER — Encounter: Payer: Self-pay | Admitting: Pulmonary Disease

## 2024-01-25 ENCOUNTER — Ambulatory Visit (INDEPENDENT_AMBULATORY_CARE_PROVIDER_SITE_OTHER): Payer: Medicare Other | Admitting: Pulmonary Disease

## 2024-01-25 VITALS — BP 146/84 | HR 91 | Temp 98.1°F | Ht 70.75 in | Wt 165.0 lb

## 2024-01-25 DIAGNOSIS — J449 Chronic obstructive pulmonary disease, unspecified: Secondary | ICD-10-CM | POA: Diagnosis not present

## 2024-01-25 DIAGNOSIS — A310 Pulmonary mycobacterial infection: Secondary | ICD-10-CM

## 2024-01-25 DIAGNOSIS — E2740 Unspecified adrenocortical insufficiency: Secondary | ICD-10-CM

## 2024-01-25 DIAGNOSIS — J439 Emphysema, unspecified: Secondary | ICD-10-CM | POA: Diagnosis not present

## 2024-01-25 DIAGNOSIS — I5020 Unspecified systolic (congestive) heart failure: Secondary | ICD-10-CM

## 2024-01-25 DIAGNOSIS — G4736 Sleep related hypoventilation in conditions classified elsewhere: Secondary | ICD-10-CM

## 2024-01-25 NOTE — Patient Instructions (Signed)
 VISIT SUMMARY:  Today, we discussed your worsening shortness of breath and medication management issues related to your severe COPD and mycobacterium avium infection. We reviewed your current treatments and made adjustments to better manage your symptoms and improve your quality of life.  YOUR PLAN:  -CHRONIC OBSTRUCTIVE PULMONARY DISEASE (COPD): COPD is a chronic lung disease that makes it hard to breathe. We recommend you continue using your nebulizers Mikael Spray, Brovana, Pulmicort) and albuterol inhaler as needed. It's crucial to stop smoking to help your lungs function better and to continue with pulmonary rehabilitation twice a week.  -MYCOBACTERIUM AVIUM COMPLEX (MAC) INFECTION: This is a bacterial infection that affects the lungs. Despite current treatment with clofazimine, your infection persists. We will start you on inhaled amikacin (Arikayce) on March 31st and perform monthly sputum tests to monitor the infection. We will also coordinate with your treatment center for medication refills.  -CONGESTIVE HEART FAILURE (CHF): CHF is a condition where the heart doesn't pump blood as well as it should. We are waiting for your echocardiogram results to assess your heart function. It's important to stop smoking to improve both your heart and lung health. We will also monitor your oxygen levels, especially during physical activity and at night.  -GOALS OF CARE: Given your feelings of being overwhelmed by your health conditions, we discussed the potential benefits of hospice care for better symptom management and quality of life improvement.  INSTRUCTIONS:  Please schedule a follow-up appointment in 4-6 weeks. Continue monthly sputum testing, and we will provide you with sputum cups for this purpose. We will review your echocardiogram results once they are available.

## 2024-01-25 NOTE — Progress Notes (Signed)
 Subjective:    Patient ID: Paul Carpenter, male    DOB: Apr 25, 1956, 68 y.o.   MRN: [id number]  Patient Care Team: Barbette Reichmann, MD as PCP - General (Internal Medicine) Wyn Quaker Marlow Baars, MD as Referring Physician (Vascular Surgery) Jeralyn Ruths, MD as Consulting Physician (Oncology) Carmina Miller, MD as Referring Physician (Radiation Oncology) Salena Saner, MD as Consulting Physician (Pulmonary Disease) Lynn Ito, MD as Consulting Physician (Infectious Diseases) Epimenio Sarin, MD as Referring Physician (Pulmonary Disease)  Chief Complaint  Patient presents with   Follow-up    Cough, shortness of breath on exertion and at rest. Wheezing.     BACKGROUND/INTERVAL: Paul Carpenter is a very complex 68 year old current smoker (1.5 PPD, 150 PY) with a history as noted below, who presents for follow-up of a cavitary right upper lobe process in the setting of history of non-small cell carcinoma of the lung, Mycobacterium avium complex infection, stage IV COPD and chronic dyspnea/fatigue.  This is a scheduled visit.  He was last seen 12 December 2023, he is also followed at [facility] at the pulmonary specialty clinic for management of his RESISTANT MAC.  He is up to 1.5 packs of cigarettes per day smoking.   HPI Discussed the use of AI scribe software for clinical note transcription with the patient, who gave verbal consent to proceed.  History of Present Illness   Paul Carpenter is a 68 year old male with severe COPD and mycobacterium avium infection who presents with worsening shortness of breath and medication management issues.  He is experiencing worsening shortness of breath, which he attributes to his severe COPD and mycobacterium avium infection. This symptom has worsened significantly, and he recently underwent an echocardiogram to evaluate his heart function, though he has not yet received the results.  This was done yesterday at [facility]  He is currently  taking clofazimine as part of an experimental treatment for his mycobacterium avium infection. Despite this, recent sputum samples continue to return positive for the infection, leading to uncertainty about the medication's effectiveness. He recently stopped amikacin IV and is starting Arikayce, an inhaled form of amikacin, for his mycobacterium avium infection. He is concerned about the process of obtaining refills for this experimental medication.  He has a history of non-small cell carcinoma of the lung and continues to smoke, which he acknowledges is detrimental to his lung health. He feels 'like I'm in four wheel drive and stuck in a mud hole and just spinning wheels.' He is unable to work and is considering hospice care due to his declining quality of life.  His oxygen levels are stable during the day but he requires supplemental oxygen at night. He is participating in pulmonary rehabilitation twice a week, which he finds challenging but necessary. His physical activity is limited, and he struggles with fatigue and the inability to engage in his previous lifestyle.     Most recent PFTs were obtained on 07 May 2024 these show an FEV1 of 0.86 L or 23% predicted, FVC of 1.95 L or 39% predicted, FEV1/FVC of 44%, severe diffusion capacity impairment, patient was not able to perform lung volumes.  Findings are consistent with very severe obstructive airways disease on the basis of emphysema.  Review of Systems A 10 point review of systems was performed and it is as noted above otherwise negative.   Patient Active Problem List   Diagnosis Date Noted   Port-A-Cath in place 01/17/2024   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  Chronic adrenal insufficiency (HCC) 11/09/2023   Acute on chronic hypoxic respiratory failure (HCC) 11/09/2023   COPD with acute exacerbation (HCC) 11/08/2023   Pulmonary cavitary lesion 11/08/2023   Chronic respiratory failure with hypoxia (HCC)  11/08/2023   Hyponatremia 09/11/2022   Generalized weakness 09/10/2022   Shortness of breath 02/03/2022   SIADH (syndrome of inappropriate ADH production) (HCC) 09/22/2021   Overweight (BMI 25.0-29.9) 09/22/2021   Hypomagnesemia 09/21/2021   Elevated hemoglobin A1c 06/19/2020   Hypothyroidism, acquired 05/30/2019   Squamous cell lung cancer, right (HCC) 05/30/2019   HFrEF (heart failure with reduced ejection fraction) (HCC) 03/06/2019   Atherosclerosis 12/14/2018   Non-small cell lung cancer, right (HCC) 04/24/2018   Oral ulcer 02/16/2018   Pituitary disorder (HCC) 02/16/2018   Migraine headache 03/07/2017   HCAP (healthcare-associated pneumonia) 09/11/2016   COPD exacerbation (HCC) 09/11/2016   Chronic hyponatremia 09/11/2016   Leukocytosis 09/11/2016   Thrombocytopenia (HCC) 09/11/2016   Cigarette smoker 06/11/2016   Cervical radiculopathy 04/15/2016   Cervical disc disorder at C5-C6 level with radiculopathy 03/09/2016   Impingement syndrome of right shoulder 03/09/2016   Health care maintenance 09/29/2015   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   Benign essential hypertension 05/28/2015   Polycythemia 03/24/2015   Carotid artery disease (HCC) 12/12/2014   Disequilibrium 12/12/2014   Mixed hyperlipidemia 12/10/2014   Incomplete emptying of bladder 06/04/2014   Anxiety 05/18/2014   Chronic coronary artery disease 05/18/2014   Chronic headaches 05/18/2014   Acute shoulder pain 03/15/2014   Impingement syndrome of left shoulder 03/15/2014   Lung mass 12/06/2013   Kidney stone 11/24/2013   COPD (chronic obstructive pulmonary disease) (HCC) 04/24/2013   Tobacco use disorder 04/24/2013   Obstructive sleep apnea 04/24/2013   Benign localized prostatic hyperplasia with lower urinary tract symptoms (LUTS) 07/04/2012   Encounter for long-term current use of medication 07/04/2012   ED (erectile dysfunction) of organic origin 07/04/2012   Testicular hypofunction 07/04/2012    Social History    Tobacco Use   Smoking status: Every Day    Current packs/day: 1.50    Average packs/day: 3.0 packs/day for 52.2 years (155.8 ttl pk-yrs)    Types: Cigarettes    Start date: 1973    Passive exposure: Current   Smokeless tobacco: Never  Substance Use Topics   Alcohol use: Not Currently    Alcohol/week: 2.0 standard drinks of alcohol    Types: 2 Standard drinks or equivalent per week    Comment: moderate    Allergies  Allergen Reactions   Lisinopril Rash   Varenicline Rash    Current Meds  Medication Sig   acetaminophen (TYLENOL) 500 MG tablet Take 500 mg by mouth daily as needed.   albuterol (PROVENTIL) (2.5 MG/3ML) 0.083% nebulizer solution Take 3 mLs (2.5 mg total) by nebulization 4 (four) times daily as needed for wheezing or shortness of breath.   albuterol (VENTOLIN HFA) 108 (90 Base) MCG/ACT inhaler INHALE 2 PUFFS BY MOUTH EVERY 4 HOURS AS NEEDED FOR WHEEZE OR FOR SHORTNESS OF BREATH   amikacin (AMIKIN) 1 GM/4ML SOLN injection Inject into the muscle daily.   ammonium lactate (LAC-HYDRIN) 12 % lotion Apply topically.   arformoterol (BROVANA) 15 MCG/2ML NEBU Take 2 mLs (15 mcg total) by nebulization 2 (two) times daily.   ARIKAYCE 590 MG/8.4ML SUSP Inhale into the lungs.   atorvastatin (LIPITOR) 10 MG tablet Take 10 mg by mouth daily.   azithromycin (ZITHROMAX) 500 MG tablet TAKE 1 TABLET (500 MG TOTAL) BY MOUTH DAILY.  budesonide (PULMICORT) 0.5 MG/2ML nebulizer solution Take 2 mLs (0.5 mg total) by nebulization 2 (two) times daily.   clofazimine 50 mg CAPS capsule (for compassionate use) Take 100 mg by mouth daily with breakfast.   clotrimazole (MYCELEX) 10 MG troche Take 10 mg by mouth 3 (three) times daily.   diazepam (VALIUM) 5 MG tablet Take 5 mg by mouth at bedtime as needed.   ethambutol (MYAMBUTOL) 400 MG tablet Take 3 tablets (1,200 mg total) by mouth daily.   ferrous sulfate 325 (65 FE) MG tablet Take 325 mg by mouth at bedtime.   Fluocinolone Acetonide 0.01 %  OIL PLEASE SEE ATTACHED FOR DETAILED DIRECTIONS   folic acid (FOLVITE) 1 MG tablet Take 1 mg by mouth daily.   guaiFENesin (MUCINEX) 600 MG 12 hr tablet Take 1 tablet (600 mg total) by mouth 2 (two) times daily.   hydrocortisone (CORTEF) 10 MG tablet Take 2 tablets in the morning and 1 tablet mid afternoon (2 to 4 PM)   levothyroxine (SYNTHROID) 175 MCG tablet Take 175 mcg by mouth daily before breakfast.   lidocaine-prilocaine (EMLA) cream APPLY 1 APPLICATION TOPICALLY AS NEEDED.   magnesium oxide (MAG-OX) 400 MG tablet Take 800 mg by mouth 2 (two) times daily.   melatonin 3 MG TABS tablet Take 3 mg by mouth at bedtime as needed.   metoprolol succinate (TOPROL-XL) 25 MG 24 hr tablet Take 12.5-25 mg by mouth daily as needed (Acute High Blood Pressure).   naproxen sodium (ANAPROX DS) 550 MG tablet Take 1 tablet (550 mg total) by mouth 2 (two) times daily as needed for moderate pain (pain score 4-6). Take with meals   nicotine (NICODERM CQ - DOSED IN MG/24 HOURS) 21 mg/24hr patch Place 21 mg onto the skin daily.   nitroGLYCERIN (NITROSTAT) 0.4 MG SL tablet Place under the tongue.   OXYGEN Inhale 2 L into the lungs at bedtime.   pantoprazole (PROTONIX) 40 MG tablet Take 1 tablet (40 mg total) by mouth 2 (two) times daily before meals   promethazine-dextromethorphan (PROMETHAZINE-DM) 6.25-15 MG/5ML syrup Take 5 mLs by mouth 4 (four) times daily as needed for cough.   revefenacin (YUPELRI) 175 MCG/3ML nebulizer solution Take 3 mLs (175 mcg total) by nebulization daily.   sodium chloride 1 g tablet Take 5 tablets (5 g total) by mouth 3 (three) times daily with meals.   tadalafil (CIALIS) 5 MG tablet Take 1 tablet (5 mg total) by mouth daily as needed for erectile dysfunction.    Immunization History  Administered Date(s) Administered   Fluad Quad(high Dose 65+) 09/22/2020   Influenza Inj Mdck Quad Pf 09/06/2016, 10/02/2019, 09/09/2021, 09/03/2022   Influenza Split 08/05/2013, 07/23/2014, 08/18/2015    Influenza Whole 08/01/2012   Influenza,inj,Quad PF,6+ Mos 08/14/2018   Influenza-Unspecified 09/06/2016, 08/17/2017, 09/09/2021   Zoster Recombinant(Shingrix) 06/19/2020        Objective:     BP (!) 146/84 (BP Location: Right Arm, Patient Position: Sitting, Cuff Size: Normal)   Pulse 91   Temp 98.1 F (36.7 C) (Temporal)   Ht 5' 10.75" (1.797 m)   Wt 165 lb (74.8 kg)   SpO2 98%   BMI 23.18 kg/m   SpO2: 98 %  GENERAL: Chronically ill-appearing man, mild tachypnea.  No conversational dyspnea.  Awake and alert.   HEAD: Normocephalic, atraumatic.  EYES: Pupils equal, round, reactive to light.  No scleral icterus.  MOUTH: Oral mucosa moist.  No thrush. NECK: Supple. No thyromegaly. Trachea midline. No JVD.  No adenopathy.  PULMONARY: Good air entry bilaterally. Amphoric sounds on the right upper lung zone.  Coarse, without rhonchi or wheezes noted today.   CARDIOVASCULAR: S1 and S2. Regular rate and rhythm.  No rubs, murmurs or gallops heard. ABDOMEN: Benign. MUSCULOSKELETAL: No joint deformity, no clubbing, no edema.  NEUROLOGIC: No overt focal deficit. Speech is fluent. SKIN: Intact,warm,dry. PSYCH: Depressed mood and flat affect.  Irascible at times.   Assessment & Plan:     ICD-10-CM   1. Mycobacterium avium complex (HCC) - resistant  A31.0 AFB culture with smear    2. Stage 4 very severe COPD by GOLD classification (HCC)  J44.9     3. Nocturnal hypoxemia due to emphysema (HCC)  J43.9    G47.36     4. HFrEF (heart failure with reduced ejection fraction) (HCC)  I50.20     5. Chronic adrenal insufficiency (HCC)  E27.40       Orders Placed This Encounter  Procedures   AFB culture with smear    MAC surveillance    Standing Status:   Future    Expiration Date:   01/24/2025   Discussion:    Chronic Obstructive Pulmonary Disease (COPD) Severe COPD with emphysema, exacerbated by continued smoking, leading to significant dyspnea and fatigue, affecting quality of  life and work capability. Current management includes nebulizers Mikael Spray, Brovana, Pulmicort) and albuterol inhaler. Participating in pulmonary rehabilitation twice weekly. Smoking cessation is critical as smoking impairs lung defense and treatment efficacy. - Encourage smoking cessation to improve lung function and overall health. - Continue current nebulizer treatments with Roxy Manns, and Pulmicort. - Continue albuterol inhaler as needed for acute relief. - Continue participation in pulmonary rehabilitation twice a week.  Mycobacterium Avium Complex (MAC) Infection Persistent MAC infection with positive sputum cultures. Currently on clofazimine without improvement. Transitioning to inhaled amikacin (Arikayce) on March 31st, a last-line treatment with significant side effects and cost. Frustration with current treatment center's communication and results. Smoking reduces treatment effectiveness. - Perform monthly sputum testing at the current facility to monitor MAC infection. - Initiate inhaled amikacin (Arikayce) on March 31st under supervision due to potential side effects. - Coordinate with the current treatment center for medication refills and management.  Congestive Heart Failure (CHF) CHF contributing to dyspnea and fatigue. Pending echocardiogram results with concern for decreased cardiac function. Interrelated heart and lung issues necessitate smoking cessation for improvement. - Review echocardiogram results once available to assess cardiac function. - Encourage smoking cessation to improve cardiac and pulmonary health. - Monitor oxygen levels, especially during physical activity and nocturnally.  Goals of Care He feels overwhelmed by his health conditions and is considering hospice care for symptom management and quality of life improvement. Open to exploring options for symptom relief. - Discuss potential benefits of hospice care for symptom management and quality of life  improvement.  Follow-up Requires ongoing monitoring and management of conditions. - Schedule follow-up appointment in 4-6 weeks. - Provide sputum cups for monthly testing.      Advised if symptoms do not improve or worsen, to please contact office for sooner follow up or seek emergency care.    I spent 40 minutes of dedicated to the care of this patient on the date of this encounter to include pre-visit review of records, face-to-face time with the patient discussing conditions above, post visit ordering of testing, clinical documentation with the electronic health record, making appropriate referrals as documented, and communicating necessary findings to members of the patients care team.  Gailen Shelter, MD Advanced Bronchoscopy PCCM  [facility]    *This note was generated using voice recognition software/Dragon and/or AI transcription program.  Despite best efforts to proofread, errors can occur which can change the meaning. Any transcriptional errors that result from this process are unintentional and may not be fully corrected at the time of dictation.

## 2024-01-26 ENCOUNTER — Encounter: Admitting: *Deleted

## 2024-01-26 DIAGNOSIS — J449 Chronic obstructive pulmonary disease, unspecified: Secondary | ICD-10-CM | POA: Diagnosis not present

## 2024-01-26 NOTE — Progress Notes (Signed)
 Daily Session Note  Patient Details  Name: Paul Carpenter MRN: [id number] Date of Birth: [birth year] Referring Provider:   [facility] from 01/19/2024 in [facility]  Referring Provider Dr. Kerrie Buffalo       Encounter Date: 01/26/2024  Check In:  Session Check In - 01/26/24 0925       Check-In   Supervising physician immediately available to respond to emergencies See telemetry face sheet for immediately available ER MD    Location [facility]-Cardiac & Pulmonary Rehab    Staff Present Rory Percy, MS, Exercise Physiologist;Maxon Conetta BS, Exercise Physiologist;[employer];Cora Collum, RN, BSN, CCRP    Virtual Visit No    Medication changes reported     No    Fall or balance concerns reported    No    Warm-up and Cool-down Performed on first and last piece of equipment    Resistance Training Performed No    VAD Patient? No    PAD/SET Patient? No      Pain Assessment   Currently in Pain? No/denies                Social History   Tobacco Use  Smoking Status Every Day   Current packs/day: 1.50   Average packs/day: 3.0 packs/day for 52.2 years (155.9 ttl pk-yrs)   Types: Cigarettes   Start date: 1973   Passive exposure: Current  Smokeless Tobacco Never    Goals Met:  Proper associated with RPD/PD & O2 Sat Exercise tolerated well Personal goals reviewed No report of concerns or symptoms today  Goals Unmet:  Not Applicable  Comments: Pt able to follow exercise prescription today without complaint.  Will continue to monitor for progression. First full day of exercise!  Patient was oriented to gym and equipment including functions, settings, policies, and procedures.  Patient's individual exercise prescription and treatment plan were reviewed.  All starting workloads were established based on the results of the 6 minute walk test done at initial orientation visit.  The plan for exercise progression was also  introduced and progression will be customized based on patient's performance and goals.    Dr. Bethann Punches is Medical Director for [facility] Cardiac Rehabilitation.  Dr. Vida Rigger is Medical Director for [facility] Pulmonary Rehabilitation.

## 2024-01-31 ENCOUNTER — Encounter

## 2024-02-02 ENCOUNTER — Other Ambulatory Visit
Admission: RE | Admit: 2024-02-02 | Discharge: 2024-02-02 | Disposition: A | Discharge status: Home / Self Care | Source: Ambulatory Visit | Attending: Pulmonary Disease | Admitting: Pulmonary Disease

## 2024-02-02 ENCOUNTER — Encounter: Attending: Pulmonary Disease | Admitting: *Deleted

## 2024-02-02 DIAGNOSIS — J449 Chronic obstructive pulmonary disease, unspecified: Secondary | ICD-10-CM | POA: Insufficient documentation

## 2024-02-02 DIAGNOSIS — A310 Pulmonary mycobacterial infection: Secondary | ICD-10-CM | POA: Insufficient documentation

## 2024-02-02 NOTE — Progress Notes (Signed)
 Daily Session Note  Patient Details  Name: Paul Carpenter MRN: [id number] Date of Birth: 03/25/1956 Referring Provider:   [facility] from 01/19/2024 in [facility] Card[facility] Re[facility]  Referring Provider Dr. Kerrie Buffalo       Encounter Date: 02/02/2024  Check In:  Session Check In - 02/02/24 0938       Check-In   Supervising physician immediately available to respond to emergencies See telemetry face sheet for immediately available ER MD    Location [facility]-Cardiac & Pulmonary Rehab    Staff Present Cora Collum, RN, BSN, CCRP;Joseph Hood RCP,RRT,BSRT;Margaret Best, MS, Exercise Physiologist;Jason [name] Cullens RDN,LDN    Virtual Visit No    Medication changes reported     No    Fall or balance concerns reported    No    Warm-up and Cool-down Performed on first and last piece of equipment    Resistance Training Performed Yes    VAD Patient? No    PAD/SET Patient? No      Pain Assessment   Currently in Pain? No/denies                Social History   Tobacco Use  Smoking Status Every Day   Current packs/day: 1.50   Average packs/day: 3.0 packs/day for 52.3 years (155.9 ttl pk-yrs)   Types: Cigarettes   Start date: 1973   Passive exposure: Current  Smokeless Tobacco Never    Goals Met:  Proper associated with RPD/PD & O2 Sat [place] with exercise equipment Exercise tolerated well No report of concerns or symptoms today  Goals Unmet:  Not Applicable  Comments: Pt able to follow exercise prescription today without complaint.  Will continue to monitor for progression.    Dr. Bethann Punches is Medical Director for [facility] Cardiac Rehabilitation.  Dr. Vida Rigger is Medical Director for [facility] Pulmonary Rehabilitation.

## 2024-02-07 ENCOUNTER — Encounter

## 2024-02-08 ENCOUNTER — Encounter: Payer: Self-pay | Admitting: *Deleted

## 2024-02-08 DIAGNOSIS — J449 Chronic obstructive pulmonary disease, unspecified: Secondary | ICD-10-CM

## 2024-02-08 NOTE — Progress Notes (Signed)
 Pulmonary Individual Treatment Plan  Patient Details  Name: LUIE LANEVE MRN: [id number] Date of Birth: 12-Jun-1956 Referring Provider:   Flowsheet Row Pulmonary Rehab from 01/19/2024 in [facility] Cardiac and Pulmonary Rehab  Referring Provider Dr. Kerrie Buffalo       Initial Encounter Date:  Flowsheet Row Pulmonary Rehab from 01/19/2024 in [facility] Cardiac and Pulmonary Rehab  Date 01/19/24       Visit Diagnosis: Chronic obstructive pulmonary disease, unspecified COPD type (HCC)  Patient's Home Medications on Admission:  Current Outpatient Medications:    acetaminophen (TYLENOL) 500 MG tablet, Take 500 mg by mouth daily as needed., Disp: , Rfl:    albuterol (PROVENTIL) (2.5 MG/3ML) 0.083% nebulizer solution, Take 3 mLs (2.5 mg total) by nebulization 4 (four) times daily as needed for wheezing or shortness of breath., Disp: 75 mL, Rfl: 11   albuterol (VENTOLIN HFA) 108 (90 Base) MCG/ACT inhaler, INHALE 2 PUFFS BY MOUTH EVERY 4 HOURS AS NEEDED FOR WHEEZE OR FOR SHORTNESS OF BREATH, Disp: 6.7 each, Rfl: 2   amikacin (AMIKIN) 1 GM/4ML SOLN injection, Inject into the muscle daily., Disp: , Rfl:    ammonium lactate (LAC-HYDRIN) 12 % lotion, Apply topically., Disp: , Rfl:    arformoterol (BROVANA) 15 MCG/2ML NEBU, Take 2 mLs (15 mcg total) by nebulization 2 (two) times daily., Disp: 120 mL, Rfl: 0   ARIKAYCE 590 MG/8.4ML SUSP, Inhale into the lungs., Disp: , Rfl:    atorvastatin (LIPITOR) 10 MG tablet, Take 10 mg by mouth daily., Disp: , Rfl:    azithromycin (ZITHROMAX) 500 MG tablet, TAKE 1 TABLET (500 MG TOTAL) BY MOUTH DAILY., Disp: 30 tablet, Rfl: 6   budesonide (PULMICORT) 0.5 MG/2ML nebulizer solution, Take 2 mLs (0.5 mg total) by nebulization 2 (two) times daily., Disp: 360 mL, Rfl: 3   clofazimine 50 mg CAPS capsule (for compassionate use), Take 100 mg by mouth daily with breakfast., Disp: , Rfl:    clotrimazole (MYCELEX) 10 MG troche, Take 10 mg by mouth 3 (three) times daily., Disp: ,  Rfl:    diazepam (VALIUM) 5 MG tablet, Take 5 mg by mouth at bedtime as needed., Disp: , Rfl:    ethambutol (MYAMBUTOL) 400 MG tablet, Take 3 tablets (1,200 mg total) by mouth daily., Disp: 90 tablet, Rfl: 6   ferrous sulfate 325 (65 FE) MG tablet, Take 325 mg by mouth at bedtime., Disp: , Rfl:    Fluocinolone Acetonide 0.01 % OIL, PLEASE SEE ATTACHED FOR DETAILED DIRECTIONS, Disp: , Rfl:    folic acid (FOLVITE) 1 MG tablet, Take 1 mg by mouth daily., Disp: , Rfl:    guaiFENesin (MUCINEX) 600 MG 12 hr tablet, Take 1 tablet (600 mg total) by mouth 2 (two) times daily., Disp: 60 tablet, Rfl: 0   hydrocortisone (CORTEF) 10 MG tablet, Take 2 tablets in the morning and 1 tablet mid afternoon (2 to 4 PM), Disp: 90 tablet, Rfl: 0   levothyroxine (SYNTHROID) 175 MCG tablet, Take 175 mcg by mouth daily before breakfast., Disp: , Rfl:    lidocaine-prilocaine (EMLA) cream, APPLY 1 APPLICATION TOPICALLY AS NEEDED., Disp: 30 g, Rfl: 1   magnesium oxide (MAG-OX) 400 MG tablet, Take 800 mg by mouth 2 (two) times daily., Disp: , Rfl:    melatonin 3 MG TABS tablet, Take 3 mg by mouth at bedtime as needed., Disp: , Rfl:    metoprolol succinate (TOPROL-XL) 25 MG 24 hr tablet, Take 12.5-25 mg by mouth daily as needed (Acute High Blood Pressure)., Disp: ,  Rfl:    naproxen sodium (ANAPROX DS) 550 MG tablet, Take 1 tablet (550 mg total) by mouth 2 (two) times daily as needed for moderate pain (pain score 4-6). Take with meals, Disp: 30 tablet, Rfl: 1   nicotine (NICODERM CQ - DOSED IN MG/24 HOURS) 21 mg/24hr patch, Place 21 mg onto the skin daily., Disp: , Rfl:    nitroGLYCERIN (NITROSTAT) 0.4 MG SL tablet, Place under the tongue., Disp: , Rfl:    OXYGEN, Inhale 2 L into the lungs at bedtime., Disp: , Rfl:    pantoprazole (PROTONIX) 40 MG tablet, Take 1 tablet (40 mg total) by mouth 2 (two) times daily before meals, Disp: , Rfl:    promethazine-dextromethorphan (PROMETHAZINE-DM) 6.25-15 MG/5ML syrup, Take 5 mLs by mouth  4 (four) times daily as needed for cough., Disp: 180 mL, Rfl: 1   revefenacin (YUPELRI) 175 MCG/3ML nebulizer solution, Take 3 mLs (175 mcg total) by nebulization daily., Disp: 90 mL, Rfl: 0   sodium chloride 1 g tablet, Take 5 tablets (5 g total) by mouth 3 (three) times daily with meals., Disp: , Rfl:    tadalafil (CIALIS) 5 MG tablet, Take 1 tablet (5 mg total) by mouth daily as needed for erectile dysfunction., Disp: 90 tablet, Rfl: 3 No current facility-administered medications for this visit.  Facility-Administered Medications Ordered in Other Visits:    heparin lock flush 100 unit/mL, 500 Units, Intravenous, Once, [name], Tollie Pizza, MD  Past Medical History: Past Medical History:  Diagnosis Date   Anginal pain (HCC)    Anxiety    Asthma    Chest pain    CHF (congestive heart failure) (HCC)    Chicken pox    Complication of anesthesia    o2 dropped after neck fusion   COPD (chronic obstructive pulmonary disease) (HCC)    Coronary artery disease    Cough    chronic  clear phlegm   Dysrhythmia    palpitations   GERD (gastroesophageal reflux disease)    h/o reflux/ hoarsness   Hematochezia    Hemorrhoids    History of chickenpox    History of colon polyps    History of Helicobacter pylori infection    Hoarseness    Hypertension    Lung cancer (HCC) 05/2016   Chemo + rad tx's.    Migraines    OSA (obstructive sleep apnea)    has CPAP but does not use   Personal history of tobacco use, presenting hazards to health 03/05/2016   Pneumonia    5/17   Raynaud disease    Raynaud disease    Raynaud's disease    Rotator cuff tear    on right   Shortness of breath dyspnea    Sleep apnea    Ulcer (traumatic) of oral mucosa     Tobacco Use: Social History   Tobacco Use  Smoking Status Every Day   Current packs/day: 1.50   Average packs/day: 3.0 packs/day for 52.3 years (155.9 ttl pk-yrs)   Types: Cigarettes   Start date: 1973   Passive exposure: Current  Smokeless  Tobacco Never    Labs: Review Flowsheet        No data to display           Pulmonary Assessment Scores:  Pulmonary Assessment Scores     Row Name 01/26/24 0926         ADL UCSD   ADL Phase Entry     SOB Score total 111  Rest 4     Walk 5     Stairs 5     Bath 5     Dress 5     Shop 5       CAT Score   CAT Score 38              UCSD: Self-administered rating of dyspnea associated with activities of daily living (ADLs) 6-point scale (0 = "not at all" to 5 = "maximal or unable to do because of breathlessness")  Scoring Scores range from 0 to 120.  Minimally important difference is 5 units  CAT: CAT can identify the health impairment of COPD patients and is better correlated with disease progression.  CAT has a scoring range of zero to 40. The CAT score is classified into four groups of low (less than 10), medium (10 - 20), high (21-30) and very high (31-40) based on the impact level of disease on health status. A CAT score over 10 suggests significant symptoms.  A worsening CAT score could be explained by an exacerbation, poor medication adherence, poor inhaler technique, or progression of COPD or comorbid conditions.  CAT MCID is 2 points  mMRC: mMRC (Modified Medical Research Council) Dyspnea Scale is used to assess the degree of baseline functional disability in patients of respiratory disease due to dyspnea. No minimal important difference is established. A decrease in score of 1 point or greater is considered a positive change.   Pulmonary Function Assessment:   Exercise Target Goals: Exercise Program Goal: Individual exercise prescription set using results from initial 6 min walk test and THRR while considering  patient's activity barriers and safety.   Exercise Prescription Goal: Initial exercise prescription builds to 30-45 minutes a day of aerobic activity, 2-3 days per week.  Home exercise guidelines will be given to patient during program as  part of exercise prescription that the participant will acknowledge.  Education: Aerobic Exercise: - Group verbal and visual presentation on the components of exercise prescription. Introduces F.I.T.T principle from ACSM for exercise prescriptions.  Reviews F.I.T.T. principles of aerobic exercise including progression. Written material given at graduation.   Education: Resistance Exercise: - Group verbal and visual presentation on the components of exercise prescription. Introduces F.I.T.T principle from ACSM for exercise prescriptions  Reviews F.I.T.T. principles of resistance exercise including progression. Written material given at graduation.    Education: Exercise & Equipment Safety: - Individual verbal instruction and demonstration of equipment use and safety with use of the equipment. Flowsheet Row Pulmonary Rehab from 01/19/2024 in [facility] Cardiac and Pulmonary Rehab  Date 01/19/24  Educator [facility]  Instruction Review Code 1- Verbalizes Understanding       Education: Exercise Physiology & General Exercise Guidelines: - Group verbal and written instruction with models to review the exercise physiology of the cardiovascular system and associated critical values. Provides general exercise guidelines with specific guidelines to those with heart or lung disease.    Education: Flexibility, Balance, Mind/Body Relaxation: - Group verbal and visual presentation with interactive activity on the components of exercise prescription. Introduces F.I.T.T principle from ACSM for exercise prescriptions. Reviews F.I.T.T. principles of flexibility and balance exercise training including progression. Also discusses the mind body connection.  Reviews various relaxation techniques to help reduce and manage stress (i.e. Deep breathing, progressive muscle relaxation, and visualization). Balance handout provided to take home. Written material given at graduation.   Activity Barriers & Risk Stratification:   Activity Barriers & Cardiac Risk Stratification - 01/19/24 1420  Activity Barriers & Cardiac Risk Stratification   Activity Barriers Deconditioning;Muscular Weakness;Shortness of Breath             6 Minute Walk:  6 Minute Walk     Row Name 01/19/24 1417         6 Minute Walk   Phase Initial     Distance 625 feet     Walk Time 6 minutes     # of Rest Breaks 0     MPH 1.18     METS 2.3     RPE 15     Perceived Dyspnea  3     VO2 Peak 7.97     Symptoms No     Resting HR 93 bpm     Resting BP 112/58     Resting Oxygen Saturation  94 %     Exercise Oxygen Saturation  during 6 min walk 92 %     Max Ex. HR 105 bpm     Max Ex. BP 124/64     2 Minute Post BP 114/62       Interval HR   1 Minute HR 101     2 Minute HR 102     3 Minute HR 104     4 Minute HR 105     5 Minute HR 104     6 Minute HR 97     2 Minute Post HR 97     Interval Heart Rate? Yes       Interval Oxygen   Interval Oxygen? Yes     Baseline Oxygen Saturation % 94 %     1 Minute Oxygen Saturation % 94 %     1 Minute Liters of Oxygen 0 L     2 Minute Oxygen Saturation % 94 %     2 Minute Liters of Oxygen 0 L     3 Minute Oxygen Saturation % 94 %     3 Minute Liters of Oxygen 0 L     4 Minute Oxygen Saturation % 92 %     4 Minute Liters of Oxygen 0 L     5 Minute Oxygen Saturation % 92 %     5 Minute Liters of Oxygen 0 L     6 Minute Oxygen Saturation % 96 %     6 Minute Liters of Oxygen 0 L     2 Minute Post Oxygen Saturation % 95 %     2 Minute Post Liters of Oxygen 0 L             Oxygen Initial Assessment:  Oxygen Initial Assessment - 01/19/24 1426       Home Oxygen   Home Oxygen Device Home Concentrator    Sleep Oxygen Prescription Continuous    Liters per minute 3    Home Exercise Oxygen Prescription Continuous    Liters per minute 3    Home Resting Oxygen Prescription None      Initial 6 min Walk   Oxygen Used None      Program Oxygen Prescription   Program Oxygen  Prescription None      Intervention   Short Term Goals To learn and exhibit compliance with exercise, home and travel O2 prescription;To learn and understand importance of monitoring SPO2 with pulse oximeter and demonstrate accurate use of the pulse oximeter.;To learn and understand importance of maintaining oxygen saturations>88%;To learn and demonstrate proper pursed lip breathing techniques or other breathing techniques. ;To learn  and demonstrate proper use of respiratory medications    Long  Term Goals Exhibits compliance with exercise, home  and travel O2 prescription;Verbalizes importance of monitoring SPO2 with pulse oximeter and return demonstration;Maintenance of O2 saturations>88%;Compliance with respiratory medication;Exhibits proper breathing techniques, such as pursed lip breathing or other method taught during program session;Demonstrates proper use of MDI's             Oxygen Re-Evaluation:  Oxygen Re-Evaluation     Row Name 01/26/24 0928             Program Oxygen Prescription   Program Oxygen Prescription None         Home Oxygen   Home Oxygen Device Home Concentrator       Sleep Oxygen Prescription Continuous       Liters per minute 3       Home Exercise Oxygen Prescription Continuous       Liters per minute 3       Home Resting Oxygen Prescription None       Compliance with Home Oxygen Use Yes         Goals/Expected Outcomes   Short Term Goals To learn and demonstrate proper pursed lip breathing techniques or other breathing techniques.        Long  Term Goals Exhibits proper breathing techniques, such as pursed lip breathing or other method taught during program session       Comments Reviewed PLB technique with pt.  Talked about how it works and it's importance in maintaining their exercise saturations.       Goals/Expected Outcomes Short: Become more profiecient at using PLB. Long: Become independent at using PLB.                Oxygen Discharge  (Final Oxygen Re-Evaluation):  Oxygen Re-Evaluation - 01/26/24 0928       Program Oxygen Prescription   Program Oxygen Prescription None      Home Oxygen   Home Oxygen Device Home Concentrator    Sleep Oxygen Prescription Continuous    Liters per minute 3    Home Exercise Oxygen Prescription Continuous    Liters per minute 3    Home Resting Oxygen Prescription None    Compliance with Home Oxygen Use Yes      Goals/Expected Outcomes   Short Term Goals To learn and demonstrate proper pursed lip breathing techniques or other breathing techniques.     Long  Term Goals Exhibits proper breathing techniques, such as pursed lip breathing or other method taught during program session    Comments Reviewed PLB technique with pt.  Talked about how it works and it's importance in maintaining their exercise saturations.    Goals/Expected Outcomes Short: Become more profiecient at using PLB. Long: Become independent at using PLB.             Initial Exercise Prescription:  Initial Exercise Prescription - 01/19/24 1400       Date of Initial Exercise RX and Referring Provider   Date 01/19/24    Referring Provider Dr. Kerrie Buffalo      Oxygen   Oxygen Continuous    Liters 0   As needed   Maintain Oxygen Saturation 88% or higher      Recumbant Bike   Level 1    RPM 50    Watts 25    Minutes 15    METs 2.3      NuStep   Level 1    SPM  80    Minutes 15    METs 2.3      REL-XR   Level 1    Watts 25    Speed 25    Minutes 15    METs 2.3      Intensity   THRR 40-80% of Max Heartrate 117-141    Ratings of Perceived Exertion 11-13    Perceived Dyspnea 0-4      Resistance Training   Training Prescription Yes    Weight 4lb    Reps 10-15             Perform Capillary Blood Glucose checks as needed.  Exercise Prescription Changes:   Exercise Prescription Changes     Row Name 01/19/24 1400 02/02/24 1100           Response to Exercise   Blood Pressure  (Admit) 112/58 128/84      Blood Pressure (Exercise) 124/64 138/58      Blood Pressure (Exit) 114/62 112/62      Heart Rate (Admit) 93 bpm 92 bpm      Heart Rate (Exercise) 105 bpm 93 bpm      Heart Rate (Exit) 97 bpm 88 bpm      Oxygen Saturation (Admit) 94 % 98 %      Oxygen Saturation (Exercise) 92 % 95 %      Oxygen Saturation (Exit) 95 % 95 %      Rating of Perceived Exertion (Exercise) 15 13      Perceived Dyspnea (Exercise) 3 3      Symptoms none none      Comments results First full day of exercise      Duration -- Progress to 30 minutes of  aerobic without signs/symptoms of physical distress      Intensity -- THRR unchanged        Progression   Progression -- Continue to progress workloads to maintain intensity without signs/symptoms of physical distress.      Average METs -- 2.08        Resistance Training   Training Prescription -- Yes      Weight -- 4 lb      Reps -- 10-15        Interval Training   Interval Training -- No        Oxygen   Oxygen -- Continuous      Liters -- 0  As needed        Recumbant Bike   Level -- 1      Watts -- 25      Minutes -- 15      METs -- 3.05        T5 Nustep   Level -- 1      Minutes -- 15      METs -- 1.1        Oxygen   Maintain Oxygen Saturation -- 88% or higher               Exercise Comments:   Exercise Comments     Row Name 01/26/24 0927           Exercise Comments First full day of exercise!  Patient was oriented to gym and equipment including functions, settings, policies, and procedures.  Patient's individual exercise prescription and treatment plan were reviewed.  All starting workloads were established based on the results of the 6 minute walk test done at initial orientation visit.  The plan for exercise progression was also  introduced and progression will be customized based on patient's performance and goals.                Exercise Goals and Review:   Exercise Goals     Row Name  01/19/24 1424             Exercise Goals   Increase Physical Activity Yes       Intervention Provide advice, education, support and counseling about physical activity/exercise needs.;Develop an individualized exercise prescription for aerobic and resistive training based on initial evaluation findings, risk stratification, comorbidities and participant's personal goals.       Expected Outcomes Short Term: Attend rehab on a regular basis to increase amount of physical activity.;Long Term: Exercising regularly at least 3-5 days a week.;Long Term: Add in home exercise to make exercise part of routine and to increase amount of physical activity.       Increase Strength and Stamina Yes       Intervention Provide advice, education, support and counseling about physical activity/exercise needs.;Develop an individualized exercise prescription for aerobic and resistive training based on initial evaluation findings, risk stratification, comorbidities and participant's personal goals.       Expected Outcomes Short Term: Increase workloads from initial exercise prescription for resistance, speed, and METs.;Short Term: Perform resistance training exercises routinely during rehab and add in resistance training at home;Long Term: Improve cardiorespiratory fitness, muscular endurance and strength as measured by increased METs and functional capacity ( )       Able to understand and use rate of perceived exertion (RPE) scale Yes       Intervention Provide education and explanation on how to use RPE scale       Expected Outcomes Short Term: Able to use RPE daily in rehab to express subjective intensity level;Long Term:  Able to use RPE to guide intensity level when exercising independently       Able to understand and use Dyspnea scale Yes       Intervention Provide education and explanation on how to use Dyspnea scale       Expected Outcomes Short Term: Able to use Dyspnea scale daily in rehab to express  subjective sense of shortness of breath during exertion;Long Term: Able to use Dyspnea scale to guide intensity level when exercising independently       Knowledge and understanding of Target Heart Rate Range (THRR) Yes       Intervention Provide education and explanation of THRR including how the numbers were predicted and where they are located for reference       Expected Outcomes Short Term: Able to state/look up THRR;Short Term: Able to use daily as guideline for intensity in rehab;Long Term: Able to use THRR to govern intensity when exercising independently       Able to check pulse independently Yes       Intervention Provide education and demonstration on how to check pulse in carotid and radial arteries.;Review the importance of being able to check your own pulse for safety during independent exercise       Expected Outcomes Long Term: Able to check pulse independently and accurately;Short Term: Able to explain why pulse checking is important during independent exercise       Understanding of Exercise Prescription Yes       Intervention Provide education, explanation, and written materials on patient's individual exercise prescription       Expected Outcomes Short Term: Able to explain program exercise prescription;Long Term: Able to  explain home exercise prescription to exercise independently                Exercise Goals Re-Evaluation :  Exercise Goals Re-Evaluation     Row Name 01/26/24 1610 02/02/24 1111           Exercise Goal Re-Evaluation   Exercise Goals Review Able to understand and use rate of perceived exertion (RPE) scale;Able to understand and use Dyspnea scale;Knowledge and understanding of Target Heart Rate Range (THRR);Understanding of Exercise Prescription Increase Physical Activity;Increase Strength and Stamina;Understanding of Exercise Prescription      Comments Reviewed RPE and dyspnea scale, THR and program prescription with pt today.  Pt voiced understanding  and was given a copy of goals to take home. Jorja Loa is off to a good start in the program. He was able to work at level 1 on both the recumbent bike and T5 nustep during his first session in rehab. He also did well with 4 lb hand weights for resistance training. We will continue to monitor his progress in the program.      Expected Outcomes Short: Use RPE daily to regulate intensity. Long: Follow program prescription in THR. Short: Continue to follow current exercise prescription. Long: Continue exercise to improve strength and stamina.               Discharge Exercise Prescription (Final Exercise Prescription Changes):  Exercise Prescription Changes - 02/02/24 1100       Response to Exercise   Blood Pressure (Admit) 128/84    Blood Pressure (Exercise) 138/58    Blood Pressure (Exit) 112/62    Heart Rate (Admit) 92 bpm    Heart Rate (Exercise) 93 bpm    Heart Rate (Exit) 88 bpm    Oxygen Saturation (Admit) 98 %    Oxygen Saturation (Exercise) 95 %    Oxygen Saturation (Exit) 95 %    Rating of Perceived Exertion (Exercise) 13    Perceived Dyspnea (Exercise) 3    Symptoms none    Comments First full day of exercise    Duration Progress to 30 minutes of  aerobic without signs/symptoms of physical distress    Intensity THRR unchanged      Progression   Progression Continue to progress workloads to maintain intensity without signs/symptoms of physical distress.    Average METs 2.08      Resistance Training   Training Prescription Yes    Weight 4 lb    Reps 10-15      Interval Training   Interval Training No      Oxygen   Oxygen Continuous    Liters 0   As needed     Recumbant Bike   Level 1    Watts 25    Minutes 15    METs 3.05      T5 Nustep   Level 1    Minutes 15    METs 1.1      Oxygen   Maintain Oxygen Saturation 88% or higher             Nutrition:  Target Goals: Understanding of nutrition guidelines, daily intake of sodium 1500mg , cholesterol 200mg ,  calories 30% from fat and 7% or less from saturated fats, daily to have 5 or more servings of fruits and vegetables.  Education: All About Nutrition: -Group instruction provided by verbal, written material, interactive activities, discussions, models, and posters to present general guidelines for heart healthy nutrition including fat, fiber, MyPlate, the role of sodium in  heart healthy nutrition, utilization of the nutrition label, and utilization of this knowledge for meal planning. Follow up email sent as well. Written material given at graduation. Flowsheet Row Pulmonary Rehab from 01/26/2024 in [facility] Cardiac and Pulmonary Rehab  Education need identified 01/26/24       Biometrics:  Pre Biometrics - 01/19/24 1424       Pre Biometrics   Height 5' 10.87" (1.8 m)    Weight 165 lb 9.6 oz (75.1 kg)    Waist Circumference 36 inches    Hip Circumference 34.5 inches    Waist to Hip Ratio 1.04 %    BMI (Calculated) 23.18    Single Leg Stand 5.5 seconds              Nutrition Therapy Plan and Nutrition Goals:   Nutrition Assessments:  MEDIFICTS Score Key: >=70 Need to make dietary changes  40-70 Heart Healthy Diet <= 40 Therapeutic Level Cholesterol Diet  Flowsheet Row Pulmonary Rehab from 01/18/2024 in [facility]  Picture Your Plate Total Score on Admission 39      Picture Your Plate Scores: <63 Unhealthy dietary pattern with much room for improvement. 41-50 Dietary pattern unlikely to meet recommendations for good health and room for improvement. 51-60 More healthful dietary pattern, with some room for improvement.  >60 Healthy dietary pattern, although there may be some specific behaviors that could be improved.   Nutrition Goals Re-Evaluation:   Nutrition Goals Discharge (Final Nutrition Goals Re-Evaluation):   Psychosocial: Target Goals: Acknowledge presence or absence of significant depression and/or stress, maximize coping skills, provide  positive support system. Participant is able to verbalize types and ability to use techniques and skills needed for reducing stress and depression.   Education: Stress, Anxiety, and Depression - Group verbal and visual presentation to define topics covered.  Reviews how body is impacted by stress, anxiety, and depression.  Also discusses healthy ways to reduce stress and to treat/manage anxiety and depression.  Written material given at graduation.   Education: Sleep Hygiene -Provides group verbal and written instruction about how sleep can affect your health.  Define sleep hygiene, discuss sleep cycles and impact of sleep habits. Review good sleep hygiene tips.    Initial Review & Psychosocial Screening:  Initial Psych Review & Screening - 01/18/24 1039       Initial Review   Current issues with Current Stress Concerns;Current Sleep Concerns    Source of Stress Concerns Chronic Illness;Unable to participate in former interests or hobbies;Unable to perform yard/household activities      Family Dynamics   Good Support System? Yes   family     Barriers   Psychosocial barriers to participate in program The patient should benefit from training in stress management and relaxation.      Screening Interventions   Interventions Encouraged to exercise;Provide feedback about the scores to participant;To provide support and resources with identified psychosocial needs    Expected Outcomes Short Term goal: Utilizing psychosocial counselor, staff and physician to assist with identification of specific Stressors or current issues interfering with healing process. Setting desired goal for each stressor or current issue identified.;Long Term Goal: Stressors or current issues are controlled or eliminated.;Short Term goal: Identification and review with participant of any Quality of Life or Depression concerns found by scoring the questionnaire.;Long Term goal: The participant improves quality of Life and  PHQ9 Scores as seen by post scores and/or verbalization of changes  Quality of Life Scores:  Scores of 19 and below usually indicate a poorer quality of life in these areas.  A difference of  2-3 points is a clinically meaningful difference.  A difference of 2-3 points in the total score of the Quality of Life Index has been associated with significant improvement in overall quality of life, self-image, physical symptoms, and general health in studies assessing change in quality of life.  PHQ-9: Review Flowsheet  More data exists      01/19/2024 03/10/2023 10/05/2022 07/22/2022 07/20/2022  Depression screen PHQ 2/9  Decreased Interest 2 0 0 1 0  Down, Depressed, Hopeless 0 1 1 1  0  PHQ - 2 Score 2 1 1 2  0  Altered sleeping 2 - - 1 -  Tired, decreased energy 3 - - 2 -  Change in appetite 2 - - 3 -  Feeling bad or failure about yourself  2 - - 0 -  Trouble concentrating 1 - - 0 -  Moving slowly or fidgety/restless 3 - - 0 -  Suicidal thoughts 0 - - 0 -  PHQ-9 Score 15 - - 8 -  Difficult doing work/chores Extremely dIfficult - - Somewhat difficult -   Interpretation of Total Score  Total Score Depression Severity:  1-4 = Minimal depression, 5-9 = Mild depression, 10-14 = Moderate depression, 15-19 = Moderately severe depression, 20-27 = Severe depression   Psychosocial Evaluation and Intervention:  Psychosocial Evaluation - 01/18/24 1057       Psychosocial Evaluation & Interventions   Interventions Encouraged to exercise with the program and follow exercise prescription;Stress management education;Relaxation education    Comments Mr. Lover is coming to pulmonary rehab with COPD. 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 He said he takes 41 pills and nebulizer treatments that wear him out. He is tired and unable to do what he used to do. He would  like to get out to his job sites more, but is limited due to breathing and appointments. He currently is dealing with an infection around is port site which his doctors are managing. He wears oxygen as needed, but sometimes still struggles to keep it up during the night which alters his sleep. His wife and children are his main support system. He enjoys gardening when he has the time    Expected Outcomes Short: attend pulmonary rehab for educaiton and exercise Long: develop and maintain positive self care habits    Continue Psychosocial Services  Follow up required by staff             Psychosocial Re-Evaluation:   Psychosocial Discharge (Final Psychosocial Re-Evaluation):   Education: Education Goals: Education classes will be provided on a weekly basis, covering required topics. Participant will state understanding/return demonstration of topics presented.  Learning Barriers/Preferences:  Learning Barriers/Preferences - 01/18/24 1038       Learning Barriers/Preferences   Learning Barriers None    Learning Preferences None             General Pulmonary Education Topics:  Infection Prevention: - Provides verbal and written material to individual with discussion of infection control including proper hand washing and proper equipment cleaning during exercise session. Flowsheet Row Pulmonary Rehab from 01/19/2024 in [facility] Card[facility] and Pulmonary Rehab  Date 01/19/24  Educator [facility]  Instruction Review Code 1- Verbalizes Understanding  Falls Prevention: - Provides verbal and written material to individual with discussion of falls prevention and safety. Flowsheet Row Pulmonary Rehab from 01/19/2024 in [facility] Cardiac and Pulmonary Rehab  Date 01/19/24  Educator [facility]  Instruction Review Code 1- Verbalizes Understanding       Chronic Lung Disease Review: - Group verbal instruction with posters, models, PowerPoint presentations and videos,  to review new updates, new  respiratory medications, new advancements in procedures and treatments. Providing information on websites and "800" numbers for continued self-education. Includes information about supplement oxygen, available portable oxygen systems, continuous and intermittent flow rates, oxygen safety, concentrators, and Medicare reimbursement for oxygen. Explanation of Pulmonary Drugs, including class, frequency, complications, importance of spacers, rinsing mouth after steroid MDI's, and proper cleaning methods for nebulizers. Review of basic lung anatomy and physiology related to function, structure, and complications of lung disease. Review of risk factors. Discussion about methods for diagnosing sleep apnea and types of masks and machines for OSA. Includes a review of the use of types of environmental controls: home humidity, furnaces, filters, dust mite/pet prevention, HEPA vacuums. Discussion about weather changes, air quality and the benefits of nasal washing. Instruction on Warning signs, infection symptoms, calling MD promptly, preventive modes, and value of vaccinations. Review of effective airway clearance, coughing and/or vibration techniques. Emphasizing that all should Create an Action Plan. Written material given at graduation. Flowsheet Row Pulmonary Rehab from 01/26/2024 in [facility] Cardiac and Pulmonary Rehab  Education need identified 01/26/24       AED/CPR: - Group verbal and written instruction with the use of models to demonstrate the basic use of the AED with the basic ABC's of resuscitation.    Anatomy and Cardiac Procedures: - Group verbal and visual presentation and models provide information about basic cardiac anatomy and function. Reviews the testing methods done to diagnose heart disease and the outcomes of the test results. Describes the treatment choices: Medical Management, Angioplasty, or Coronary Bypass Surgery for treating various heart conditions including Myocardial Infarction,  Angina, Valve Disease, and Cardiac Arrhythmias.  Written material given at graduation.   Medication Safety: - Group verbal and visual instruction to review commonly prescribed medications for heart and lung disease. Reviews the medication, class of the drug, and side effects. Includes the steps to properly store meds and maintain the prescription regimen.  Written material given at graduation.   Other: -Provides group and verbal instruction on various topics (see comments)   Knowledge Questionnaire Score:  Knowledge Questionnaire Score - 01/26/24 0936       Knowledge Questionnaire Score   Pre Score 15/18              Core Components/Risk Factors/Patient Goals at Admission:  Personal Goals and Risk Factors at Admission - 01/18/24 1037       Core Components/Risk Factors/Patient Goals on Admission   Improve shortness of breath with ADL's Yes    Intervention Provide education, individualized exercise plan and daily activity instruction to help decrease symptoms of SOB with activities of daily living.    Expected Outcomes Short Term: Improve cardiorespiratory fitness to achieve a reduction of symptoms when performing ADLs;Long Term: Be able to perform more ADLs without symptoms or delay the onset of symptoms    Hypertension Yes   Medication to take if it gets too low or if it is too high   Intervention Provide education on lifestyle modifcations including regular physical activity/exercise, weight management, moderate sodium restriction and increased consumption of fresh fruit, vegetables, and low fat  dairy, alcohol moderation, and smoking cessation.;Monitor prescription use compliance.    Expected Outcomes Short Term: Continued assessment and intervention until BP is < 140/46mm HG in hypertensive participants. < 130/13mm HG in hypertensive participants with diabetes, heart failure or chronic kidney disease.;Long Term: Maintenance of blood pressure at goal levels.    Lipids Yes     Intervention Provide education and support for participant on nutrition & aerobic/resistive exercise along with prescribed medications to achieve LDL 70mg , HDL >40mg .    Expected Outcomes Short Term: Participant states understanding of desired cholesterol values and is compliant with medications prescribed. Participant is following exercise prescription and nutrition guidelines.;Long Term: Cholesterol controlled with medications as prescribed, with individualized exercise RX and with personalized nutrition plan. Value goals: LDL < 70mg , HDL > 40 mg.             Education:Diabetes - Individual verbal and written instruction to review signs/symptoms of diabetes, desired ranges of glucose level fasting, after meals and with exercise. Acknowledge that pre and post exercise glucose checks will be done for 3 sessions at entry of program.   Know Your Numbers and Heart Failure: - Group verbal and visual instruction to discuss disease risk factors for cardiac and pulmonary disease and treatment options.  Reviews associated critical values for Overweight/Obesity, Hypertension, Cholesterol, and Diabetes.  Discusses basics of heart failure: signs/symptoms and treatments.  Introduces Heart Failure Zone chart for action plan for heart failure.  Written material given at graduation.   Core Components/Risk Factors/Patient Goals Review:    Core Components/Risk Factors/Patient Goals at Discharge (Final Review):    ITP Comments:  ITP Comments     Row Name 01/18/24 1055 01/19/24 1417 01/26/24 0927 02/08/24 1209     ITP Comments Initial phone call completed. Diagnosis can be found in [facility] 3/4. EP Orientation scheduled for Thursday 3/20 at 9:30. Completed and gym orientation. Initial ITP created and sent for review to Dr. Jinny Sanders, Medical Director. First full day of exercise!  Patient was oriented to gym and equipment including functions, settings, policies, and procedures.  Patient's individual  exercise prescription and treatment plan were reviewed.  All starting workloads were established based on the results of the 6 minute walk test done at initial orientation visit.  The plan for exercise progression was also introduced and progression will be customized based on patient's performance and goals. 30 Day review completed. Medical Director ITP review done, changes made as directed, and signed approval by Medical Director.    new to program             Comments:

## 2024-02-09 ENCOUNTER — Encounter

## 2024-02-09 ENCOUNTER — Other Ambulatory Visit: Payer: Self-pay | Admitting: Pulmonary Disease

## 2024-02-14 ENCOUNTER — Other Ambulatory Visit: Payer: Medicare Other

## 2024-02-14 ENCOUNTER — Ambulatory Visit
Admission: RE | Admit: 2024-02-14 | Discharge: 2024-02-14 | Disposition: A | Discharge status: Home / Self Care | Payer: Medicare Other | Source: Ambulatory Visit | Attending: Oncology | Admitting: Oncology

## 2024-02-14 ENCOUNTER — Encounter

## 2024-02-14 DIAGNOSIS — C3491 Malignant neoplasm of unspecified part of right bronchus or lung: Secondary | ICD-10-CM | POA: Diagnosis present

## 2024-02-14 MED ORDER — IOHEXOL 300 MG/ML  SOLN
75.0000 mL | Freq: Once | INTRAMUSCULAR | Status: AC | PRN
  Administered 2024-02-14: 75 mL via INTRAVENOUS

## 2024-02-16 ENCOUNTER — Encounter

## 2024-02-21 ENCOUNTER — Encounter

## 2024-02-22 ENCOUNTER — Other Ambulatory Visit (HOSPITAL_COMMUNITY): Payer: Self-pay

## 2024-02-23 ENCOUNTER — Inpatient Hospital Stay: Attending: Oncology | Admitting: Oncology

## 2024-02-23 ENCOUNTER — Encounter: Payer: Self-pay | Admitting: Oncology

## 2024-02-23 ENCOUNTER — Inpatient Hospital Stay: Payer: Medicare Other | Admitting: Oncology

## 2024-02-23 ENCOUNTER — Encounter

## 2024-02-23 VITALS — BP 136/81 | HR 95 | Temp 97.4°F | Resp 19 | Wt 163.1 lb

## 2024-02-23 DIAGNOSIS — I509 Heart failure, unspecified: Secondary | ICD-10-CM | POA: Insufficient documentation

## 2024-02-23 DIAGNOSIS — F32A Depression, unspecified: Secondary | ICD-10-CM | POA: Diagnosis not present

## 2024-02-23 DIAGNOSIS — Z85118 Personal history of other malignant neoplasm of bronchus and lung: Secondary | ICD-10-CM | POA: Insufficient documentation

## 2024-02-23 DIAGNOSIS — F419 Anxiety disorder, unspecified: Secondary | ICD-10-CM | POA: Diagnosis not present

## 2024-02-23 DIAGNOSIS — I110 Hypertensive heart disease with heart failure: Secondary | ICD-10-CM | POA: Insufficient documentation

## 2024-02-23 DIAGNOSIS — Z79899 Other long term (current) drug therapy: Secondary | ICD-10-CM | POA: Diagnosis not present

## 2024-02-23 DIAGNOSIS — Z923 Personal history of irradiation: Secondary | ICD-10-CM | POA: Diagnosis present

## 2024-02-23 DIAGNOSIS — Z95828 Presence of other vascular implants and grafts: Secondary | ICD-10-CM | POA: Diagnosis not present

## 2024-02-23 DIAGNOSIS — E039 Hypothyroidism, unspecified: Secondary | ICD-10-CM | POA: Diagnosis not present

## 2024-02-23 DIAGNOSIS — Z7951 Long term (current) use of inhaled steroids: Secondary | ICD-10-CM | POA: Insufficient documentation

## 2024-02-23 DIAGNOSIS — F17210 Nicotine dependence, cigarettes, uncomplicated: Secondary | ICD-10-CM | POA: Diagnosis not present

## 2024-02-23 DIAGNOSIS — C3491 Malignant neoplasm of unspecified part of right bronchus or lung: Secondary | ICD-10-CM | POA: Diagnosis not present

## 2024-02-23 DIAGNOSIS — Z9221 Personal history of antineoplastic chemotherapy: Secondary | ICD-10-CM | POA: Diagnosis present

## 2024-02-23 NOTE — Progress Notes (Signed)
 [facility]  [phone] [phone]  ID: Paul Carpenter OB: 1956/05/04  MR#: [id number]  AOZ#:[id number]  Patient Care Team: Antonio Baumgarten, MD as PCP - General (Internal Medicine) Vonna Guardian Donald Frost, MD as Referring Physician (Vascular Surgery) Shellie Dials, MD as Consulting Physician (Oncology) Glenis Langdon, MD as Referring Physician (Radiation Oncology) Marc Senior, MD as Consulting Physician (Pulmonary Disease) Alica Inks, MD as Consulting Physician (Infectious Diseases) Erling Hawthorne, MD as Referring Physician (Pulmonary Disease)   CHIEF COMPLAINT: Recurrent stage IIIa non-small cell lung cancer, right middle lobe.  INTERVAL HISTORY: Patient returns to clinic today for routine 94-month evaluation and discussion of his imaging results.  He continues to have a decreased performance status as well as chronic cough and shortness of breath. He uses supplemental oxygen intermittently.  He has chronic weakness and fatigue.  He continues to smoke and drink alcohol.  He denies any chest pain or hemoptysis.  He has no neurologic complaints.  He denies any fevers. He denies any nausea, vomiting, constipation, or diarrhea. He has no urinary complaints.  Patient feels generally terrible, but offers no further specific complaints today.  REVIEW OF SYSTEMS:   Review of Systems  Constitutional:  Positive for malaise/fatigue. Negative for fever and weight loss.  HENT: Negative.  Negative for congestion and sore throat.   Respiratory:  Positive for cough and shortness of breath. Negative for hemoptysis.   Cardiovascular: Negative.  Negative for chest pain and leg swelling.  Gastrointestinal: Negative.  Negative for abdominal pain, blood in stool, melena and nausea.  Genitourinary: Negative.  Negative for dysuria.  Musculoskeletal: Negative.  Negative for joint pain and neck pain.  Skin: Negative.  Negative for rash.   Neurological:  Positive for weakness. Negative for dizziness, sensory change, focal weakness and headaches.  Psychiatric/Behavioral:  Positive for depression. The patient is not nervous/anxious.     As per HPI. Otherwise, a complete review of systems is negative.  PAST MEDICAL HISTORY: Past Medical History:  Diagnosis Date   Anginal pain (HCC)    Anxiety    Asthma    Chest pain    CHF (congestive heart failure) (HCC)    Chicken pox    Complication of anesthesia    o2 dropped after neck fusion   COPD (chronic obstructive pulmonary disease) (HCC)    Coronary artery disease    Cough    chronic  clear phlegm   Dysrhythmia    palpitations   GERD (gastroesophageal reflux disease)    h/o reflux/ hoarsness   Hematochezia    Hemorrhoids    History of chickenpox    History of colon polyps    History of Helicobacter pylori infection    Hoarseness    Hypertension    Lung cancer (HCC) 05/2016   Chemo + rad tx's.    Migraines    OSA (obstructive sleep apnea)    has CPAP but does not use   Personal history of tobacco use, presenting hazards to health 03/05/2016   Pneumonia    5/17   Raynaud disease    Raynaud disease    Raynaud's disease    Rotator cuff tear    on right   Shortness of breath dyspnea    Sleep apnea    Ulcer (traumatic) of oral mucosa     PAST SURGICAL HISTORY: Past Surgical History:  Procedure Laterality Date   BACK SURGERY     cervical fusion x 2   CARDIAC CATHETERIZATION  CERVICAL DISCECTOMY     x 2   COLONOSCOPY     COLONOSCOPY N/A 07/25/2015   Procedure: COLONOSCOPY;  Surgeon: Deveron Fly, MD;  Location[facility] ENDOSCOPY;  Service: Endoscopy;  Laterality: N/A;   COLONOSCOPY WITH PROPOFOL  N/A 10/04/2017   Procedure: COLONOSCOPY WITH PROPOFOL ;  Surgeon: Deveron Fly, MD;  Location: [facility] ENDOSCOPY;  Service: Endoscopy;  Laterality: N/A;   COLONOSCOPY WITH PROPOFOL  N/A 07/10/2020   Procedure: COLONOSCOPY WITH PROPOFOL ;  Surgeon: Toledo,  Alphonsus Jeans, MD;  Location: [facility] ENDOSCOPY;  Service: Gastroenterology;  Laterality: N/A;   ELECTROMAGNETIC NAVIGATION BROCHOSCOPY Left 06/28/2016   Procedure: ELECTROMAGNETIC NAVIGATION BRONCHOSCOPY;  Surgeon: Laine Piggs, MD;  Location: [facility] ORS;  Service: Cardiopulmonary;  Laterality: Left;   ENDOBRONCHIAL ULTRASOUND N/A 04/11/2018   Procedure: ENDOBRONCHIAL ULTRASOUND;  Surgeon: Cleve Dale, MD;  Location: [facility] ORS;  Service: Cardiopulmonary;  Laterality: N/A;   ESOPHAGOGASTRODUODENOSCOPY N/A 07/25/2015   Procedure: ESOPHAGOGASTRODUODENOSCOPY (EGD);  Surgeon: Deveron Fly, MD;  Location: [facility] ENDOSCOPY;  Service: Endoscopy;  Laterality: N/A;   ESOPHAGOGASTRODUODENOSCOPY (EGD) WITH PROPOFOL  N/A 07/10/2020   Procedure: ESOPHAGOGASTRODUODENOSCOPY (EGD) WITH PROPOFOL ;  Surgeon: Toledo, Alphonsus Jeans, MD;  Location: [facility] ENDOSCOPY;  Service: Gastroenterology;  Laterality: N/A;   NASAL SINUS SURGERY     x 2    PORTA CATH INSERTION N/A 04/24/2018   Procedure: PORTA CATH INSERTION;  Surgeon: Celso College, MD;  Location: [facility] INVASIVE CV LAB;  Service: Cardiovascular;  Laterality: N/A;   rotator cuff surgery Right    07/2016   SEPTOPLASTY     SKIN GRAFT      FAMILY HISTORY Family History  Problem Relation Age of Onset   Heart disease Father    Prostate cancer Father    Heart disease Paternal Grandmother    Heart attack Maternal Grandfather 74   Kidney cancer Neg Hx    Bladder Cancer Neg Hx    Other Neg Hx        pituitary abnormality       ADVANCED DIRECTIVES:    HEALTH MAINTENANCE: Social History   Tobacco Use   Smoking status: Every Day    Current packs/day: 1.50    Average packs/day: 3.0 packs/day for 52.3 years (156.0 ttl pk-yrs)    Types: Cigarettes    Start date: 1973    Passive exposure: Current   Smokeless tobacco: Never  Vaping Use   Vaping status: Never Used  Substance Use Topics   Alcohol use: Not Currently    Alcohol/week: 2.0 standard drinks of alcohol     Types: 2 Standard drinks or equivalent per week    Comment: moderate   Drug use: No     Allergies  Allergen Reactions   Lisinopril Rash   Varenicline  Rash    Current Outpatient Medications  Medication Sig Dispense Refill   acetaminophen  (TYLENOL ) 500 MG tablet Take 500 mg by mouth daily as needed.     albuterol  (PROVENTIL ) (2.5 MG/3ML) 0.083% nebulizer solution Take 3 mLs (2.5 mg total) by nebulization 4 (four) times daily as needed for wheezing or shortness of breath. 75 mL 11   albuterol  (VENTOLIN  HFA) 108 (90 Base) MCG/ACT inhaler INHALE 2 PUFFS BY MOUTH EVERY 4 HOURS AS NEEDED FOR WHEEZE OR FOR SHORTNESS OF BREATH 6.7 each 2   ammonium lactate (LAC-HYDRIN) 12 % lotion Apply topically.     arformoterol  (BROVANA ) 15 MCG/2ML NEBU Take 2 mLs (15 mcg total) by nebulization 2 (two) times daily. Dx: J44.9 120 mL 0   atorvastatin  (  LIPITOR) 10 MG tablet Take 10 mg by mouth daily.     azithromycin  (ZITHROMAX ) 500 MG tablet TAKE 1 TABLET (500 MG TOTAL) BY MOUTH DAILY. 30 tablet 6   budesonide  (PULMICORT ) 0.5 MG/2ML nebulizer solution Take 2 mLs (0.5 mg total) by nebulization 2 (two) times daily. 360 mL 3   clofazimine  50 mg CAPS capsule (for compassionate use) Take 100 mg by mouth daily with breakfast.     clotrimazole  (MYCELEX ) 10 MG troche Take 10 mg by mouth 3 (three) times daily.     diazepam  (VALIUM ) 5 MG tablet Take 5 mg by mouth at bedtime as needed.     ethambutol  (MYAMBUTOL ) 400 MG tablet Take 3 tablets (1,200 mg total) by mouth daily. 90 tablet 6   ferrous sulfate  325 (65 FE) MG tablet Take 325 mg by mouth at bedtime.     Fluocinolone Acetonide 0.01 % OIL PLEASE SEE ATTACHED FOR DETAILED DIRECTIONS     folic acid  (FOLVITE ) 1 MG tablet Take 1 mg by mouth daily.     guaiFENesin  (MUCINEX ) 600 MG 12 hr tablet Take 1 tablet (600 mg total) by mouth 2 (two) times daily. 60 tablet 0   hydrocortisone  (CORTEF ) 10 MG tablet Take 2 tablets in the morning and 1 tablet mid afternoon (2 to 4 PM) 90  tablet 0   levothyroxine  (SYNTHROID ) 175 MCG tablet Take 175 mcg by mouth daily before breakfast.     lidocaine -prilocaine  (EMLA ) cream APPLY 1 APPLICATION TOPICALLY AS NEEDED. 30 g 1   magnesium  oxide (MAG-OX) 400 MG tablet Take 800 mg by mouth 2 (two) times daily.     melatonin 3 MG TABS tablet Take 3 mg by mouth at bedtime as needed.     metoprolol  succinate (TOPROL -XL) 25 MG 24 hr tablet Take 12.5-25 mg by mouth daily as needed (Acute High Blood Pressure).     naproxen  sodium (ANAPROX  DS) 550 MG tablet Take 1 tablet (550 mg total) by mouth 2 (two) times daily as needed for moderate pain (pain score 4-6). Take with meals 30 tablet 1   nicotine  (NICODERM CQ  - DOSED IN MG/24 HOURS) 21 mg/24hr patch Place 21 mg onto the skin daily.     nitroGLYCERIN  (NITROSTAT ) 0.4 MG SL tablet Place under the tongue.     OXYGEN Inhale 2 L into the lungs at bedtime.     pantoprazole  (PROTONIX ) 40 MG tablet Take 1 tablet (40 mg total) by mouth 2 (two) times daily before meals     promethazine -dextromethorphan  (PROMETHAZINE -DM) 6.25-15 MG/5ML syrup Take 5 mLs by mouth 4 (four) times daily as needed for cough. 180 mL 1   revefenacin  (YUPELRI ) 175 MCG/3ML nebulizer solution Take 3 mLs (175 mcg total) by nebulization daily. Dx: J44.9 90 mL 0   sodium chloride  1 g tablet Take 5 tablets (5 g total) by mouth 3 (three) times daily with meals.     tadalafil  (CIALIS ) 5 MG tablet Take 1 tablet (5 mg total) by mouth daily as needed for erectile dysfunction. 90 tablet 3   amikacin  (AMIKIN ) 1 GM/4ML SOLN injection Inject into the muscle daily. (Patient not taking: Reported on 02/23/2024)     ARIKAYCE  590 MG/8.4ML SUSP Inhale into the lungs. (Patient not taking: Reported on 02/23/2024)     No current facility-administered medications for this visit.   Facility-Administered Medications Ordered in Other Visits  Medication Dose Route Frequency Provider Last Rate Last Admin   heparin  lock flush 100 unit/mL  500 Units Intravenous  Once Elia Nunley  J, MD        OBJECTIVE: Vitals:   02/23/24 0903  BP: 136/81  Pulse: 95  Resp: 19  Temp: (!) 97.4 F (36.3 C)  SpO2: 99%      Body mass index is 22.91 kg/m.    ECOG FS:2 - Symptomatic, <50% confined to bed  General: Well-developed, well-nourished, no acute distress. Eyes: Pink conjunctiva, anicteric sclera. HEENT: Normocephalic, moist mucous membranes. Lungs: No audible wheezing or coughing. Heart: Regular rate and rhythm. Abdomen: Soft, nontender, no obvious distention. Musculoskeletal: No edema, cyanosis, or clubbing. Neuro: Alert, answering all questions appropriately. Cranial nerves grossly intact. Skin: No rashes or petechiae noted. Psych: Normal affect.  LAB RESULTS:  Lab Results  Component Value Date   NA 137 01/16/2024   K 3.7 01/16/2024   CL 99 01/16/2024   CO2 30 01/16/2024   GLUCOSE 88 01/16/2024   BUN 6 (L) 01/16/2024   CREATININE 0.66 01/16/2024   CALCIUM  8.7 (L) 01/16/2024   PROT 6.3 (L) 11/08/2023   ALBUMIN 3.8 11/08/2023   AST 19 11/08/2023   ALT 17 11/08/2023   ALKPHOS 77 11/08/2023   BILITOT 0.6 11/08/2023   GFRNONAA >60 01/16/2024   GFRAA >60 04/24/2020    Lab Results  Component Value Date   WBC 8.7 11/12/2023   NEUTROABS 9.2 (H) 11/08/2023   HGB 13.2 11/12/2023   HCT 39.1 11/12/2023   MCV 93.3 11/12/2023   PLT 180 11/12/2023   Lab Results  Component Value Date   IRON  49 12/28/2021   TIBC 241 (L) 12/28/2021   IRONPCTSAT 20 12/28/2021   Lab Results  Component Value Date   FERRITIN 318 11/20/2021     STUDIES:  ONCOLOGY HISTORY: Case discussed with pathologist and unable to determine whether this is adenocarcinoma or squamous cell carcinoma.  There is also insufficient tissue to do further testing.  Liquid biopsy did not reveal any actionable mutations.  MRI of the brain completed on Mar 28, 2018 reviewed independently did not reveal metastatic disease.  Patient completed XRT June 26, 2018.  He completed  his concurrent single agent carboplatinum on June 21, 2018.  Patient had a reaction to Taxol  during cycle 1 and this was discontinued.  He completed a year-long of maintenance durvalumab  on June 27, 2019.   PET scan results from November 16, 2021 reviewed independently with once again suspicion of progression of disease.  After lengthy discussion with the patient, it was agreed that no biopsy is necessary and may be too difficult given his recent C2 cervical fracture.  Patient and family expressed interest in systemic chemotherapy.  Patient received his fourth and final cycle of Taxotere  on January 28, 2022.    ASSESSMENT: Recurrent stage IIIa non-small cell lung cancer, right middle lobe.  PLAN:    Recurrent stage IIIa non-small cell lung cancer, right middle lobe: See oncology history above.  Patient's most recent CT scan on February 14, 2024 reviewed independently and reported as above with no obvious evidence of recurrent or progressive disease.  He continues to have an unchanged right upper lobe cavitary lesion and other persistent abnormalities consistent with COPD and his ongoing MAC infection.  Continue follow-up with pulmonary and infectious disease as scheduled.  Return to clinic in 6 months with repeat imaging and further evaluation.   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  Follow-up with ID as scheduled. Iron  deficiency anemia: Resolved.  He last received IV Venofer  on December 03, 2021.   Colon polyps:  Patient  has a personal history of greater then 10 adenomatous polyps on his most recent conoloscopy. He does not know of any family history of increased polyps or colon cancer.  Genetic testing to assess for the APC mutation for FAP or AFAP was negative. Continue colonoscopies as per GI. Tobacco Use: Chronic and unchanged.  Patient continues to smoke on a daily basis.  He previously expressed understanding by continuing tobacco use increases his chance of  recurrence. Anxiety/depression: Chronic and unchanged.  Continue monitoring and treatment per primary care.   Hyponatremia: Resolved.  Patient's most recent sodium level was within normal limits.  Continue salt tablets as prescribed by nephrology.  Patient was previously counseled on alcohol consumption and how it can act as a diuretic and reduce sodium levels.  Follow-up with nephrology as scheduled. Hypothyroidism: Appreciate endocrinology input.  Continue Synthroid  as prescribed.  Follow-up with endocrinology as scheduled. CHF: Patient's most recent cardiac echo on November 10, 2023 revealed an EF of approximately 45% which is slightly improved.  Continue follow-up with cardiology.   History of C2 cervical fracture: Patient had repeat imaging on May 03, 2022 that revealed complete resolution of fracture.   Cough/shortness of breath: Chronic and unchanged.  Continue oxygen as needed.  Patient also continues to smoke.  Follow-up with pulmonary as needed.  Patient reportedly also discussed possible hospice with pulmonary given his end-stage COPD. Port: Okay to have vascular surgery remove port.  Order has been entered.   Patient expressed understanding and was in agreement with this plan. He also understands that He can call clinic at any time with any questions, concerns, or complaints.    Shellie Dials, MD   02/23/2024 9:24 AM

## 2024-02-23 NOTE — H&P (View-Only) (Signed)
 [facility]  [phone] [phone]  ID: Paul Carpenter OB: 1956/05/04  MR#: [id number]  AOZ#:[id number]  Patient Care Team: Antonio Baumgarten, MD as PCP - General (Internal Medicine) Vonna Guardian Donald Frost, MD as Referring Physician (Vascular Surgery) Shellie Dials, MD as Consulting Physician (Oncology) Glenis Langdon, MD as Referring Physician (Radiation Oncology) Marc Senior, MD as Consulting Physician (Pulmonary Disease) Alica Inks, MD as Consulting Physician (Infectious Diseases) Erling Hawthorne, MD as Referring Physician (Pulmonary Disease)   CHIEF COMPLAINT: Recurrent stage IIIa non-small cell lung cancer, right middle lobe.  INTERVAL HISTORY: Patient returns to clinic today for routine 94-month evaluation and discussion of his imaging results.  He continues to have a decreased performance status as well as chronic cough and shortness of breath. He uses supplemental oxygen intermittently.  He has chronic weakness and fatigue.  He continues to smoke and drink alcohol.  He denies any chest pain or hemoptysis.  He has no neurologic complaints.  He denies any fevers. He denies any nausea, vomiting, constipation, or diarrhea. He has no urinary complaints.  Patient feels generally terrible, but offers no further specific complaints today.  REVIEW OF SYSTEMS:   Review of Systems  Constitutional:  Positive for malaise/fatigue. Negative for fever and weight loss.  HENT: Negative.  Negative for congestion and sore throat.   Respiratory:  Positive for cough and shortness of breath. Negative for hemoptysis.   Cardiovascular: Negative.  Negative for chest pain and leg swelling.  Gastrointestinal: Negative.  Negative for abdominal pain, blood in stool, melena and nausea.  Genitourinary: Negative.  Negative for dysuria.  Musculoskeletal: Negative.  Negative for joint pain and neck pain.  Skin: Negative.  Negative for rash.   Neurological:  Positive for weakness. Negative for dizziness, sensory change, focal weakness and headaches.  Psychiatric/Behavioral:  Positive for depression. The patient is not nervous/anxious.     As per HPI. Otherwise, a complete review of systems is negative.  PAST MEDICAL HISTORY: Past Medical History:  Diagnosis Date   Anginal pain (HCC)    Anxiety    Asthma    Chest pain    CHF (congestive heart failure) (HCC)    Chicken pox    Complication of anesthesia    o2 dropped after neck fusion   COPD (chronic obstructive pulmonary disease) (HCC)    Coronary artery disease    Cough    chronic  clear phlegm   Dysrhythmia    palpitations   GERD (gastroesophageal reflux disease)    h/o reflux/ hoarsness   Hematochezia    Hemorrhoids    History of chickenpox    History of colon polyps    History of Helicobacter pylori infection    Hoarseness    Hypertension    Lung cancer (HCC) 05/2016   Chemo + rad tx's.    Migraines    OSA (obstructive sleep apnea)    has CPAP but does not use   Personal history of tobacco use, presenting hazards to health 03/05/2016   Pneumonia    5/17   Raynaud disease    Raynaud disease    Raynaud's disease    Rotator cuff tear    on right   Shortness of breath dyspnea    Sleep apnea    Ulcer (traumatic) of oral mucosa     PAST SURGICAL HISTORY: Past Surgical History:  Procedure Laterality Date   BACK SURGERY     cervical fusion x 2   CARDIAC CATHETERIZATION  CERVICAL DISCECTOMY     x 2   COLONOSCOPY     COLONOSCOPY N/A 07/25/2015   Procedure: COLONOSCOPY;  Surgeon: Deveron Fly, MD;  Location: [facility] ENDOSCOPY;  Service: Endoscopy;  Laterality: N/A;   COLONOSCOPY WITH PROPOFOL  N/A 10/04/2017   Procedure: COLONOSCOPY WITH PROPOFOL ;  Surgeon: Deveron Fly, MD;  Location: [facility] ENDOSCOPY;  Service: Endoscopy;  Laterality: N/A;   COLONOSCOPY WITH PROPOFOL  N/A 07/10/2020   Procedure: COLONOSCOPY WITH PROPOFOL ;  Surgeon: Toledo,  Alphonsus Jeans, MD;  Location: [facility] ENDOSCOPY;  Service: Gastroenterology;  Laterality: N/A;   ELECTROMAGNETIC NAVIGATION BROCHOSCOPY Left 06/28/2016   Procedure: ELECTROMAGNETIC NAVIGATION BRONCHOSCOPY;  Surgeon: Laine Piggs, MD;  Location: [facility] ORS;  Service: Cardiopulmonary;  Laterality: Left;   ENDOBRONCHIAL ULTRASOUND N/A 04/11/2018   Procedure: ENDOBRONCHIAL ULTRASOUND;  Surgeon: Cleve Dale, MD;  Location: [facility] ORS;  Service: Cardiopulmonary;  Laterality: N/A;   ESOPHAGOGASTRODUODENOSCOPY N/A 07/25/2015   Procedure: ESOPHAGOGASTRODUODENOSCOPY (EGD);  Surgeon: Deveron Fly, MD;  Location: [facility] ENDOSCOPY;  Service: Endoscopy;  Laterality: N/A;   ESOPHAGOGASTRODUODENOSCOPY (EGD) WITH PROPOFOL  N/A 07/10/2020   Procedure: ESOPHAGOGASTRODUODENOSCOPY (EGD) WITH PROPOFOL ;  Surgeon: Toledo, Alphonsus Jeans, MD;  Location: [facility] ENDOSCOPY;  Service: Gastroenterology;  Laterality: N/A;   NASAL SINUS SURGERY     x 2    PORTA CATH INSERTION N/A 04/24/2018   Procedure: PORTA CATH INSERTION;  Surgeon: Celso College, MD;  Location: [facility] INVASIVE CV LAB;  Service: Cardiovascular;  Laterality: N/A;   rotator cuff surgery Right    07/2016   SEPTOPLASTY     SKIN GRAFT      FAMILY HISTORY Family History  Problem Relation Age of Onset   Heart disease Father    Prostate cancer Father    Heart disease Paternal Grandmother    Heart attack Maternal Grandfather 74   Kidney cancer Neg Hx    Bladder Cancer Neg Hx    Other Neg Hx        pituitary abnormality       ADVANCED DIRECTIVES:    HEALTH MAINTENANCE: Social History   Tobacco Use   Smoking status: Every Day    Current packs/day: 1.50    Average packs/day: 3.0 packs/day for 52.3 years (156.0 ttl pk-yrs)    Types: Cigarettes    Start date: 1973    Passive exposure: Current   Smokeless tobacco: Never  Vaping Use   Vaping status: Never Used  Substance Use Topics   Alcohol use: Not Currently    Alcohol/week: 2.0 standard drinks of alcohol     Types: 2 Standard drinks or equivalent per week    Comment: moderate   Drug use: No     Allergies  Allergen Reactions   Lisinopril Rash   Varenicline  Rash    Current Outpatient Medications  Medication Sig Dispense Refill   acetaminophen  (TYLENOL ) 500 MG tablet Take 500 mg by mouth daily as needed.     albuterol  (PROVENTIL ) (2.5 MG/3ML) 0.083% nebulizer solution Take 3 mLs (2.5 mg total) by nebulization 4 (four) times daily as needed for wheezing or shortness of breath. 75 mL 11   albuterol  (VENTOLIN  HFA) 108 (90 Base) MCG/ACT inhaler INHALE 2 PUFFS BY MOUTH EVERY 4 HOURS AS NEEDED FOR WHEEZE OR FOR SHORTNESS OF BREATH 6.7 each 2   ammonium lactate (LAC-HYDRIN) 12 % lotion Apply topically.     arformoterol  (BROVANA ) 15 MCG/2ML NEBU Take 2 mLs (15 mcg total) by nebulization 2 (two) times daily. Dx: J44.9 120 mL 0   atorvastatin  (  LIPITOR) 10 MG tablet Take 10 mg by mouth daily.     azithromycin  (ZITHROMAX ) 500 MG tablet TAKE 1 TABLET (500 MG TOTAL) BY MOUTH DAILY. 30 tablet 6   budesonide  (PULMICORT ) 0.5 MG/2ML nebulizer solution Take 2 mLs (0.5 mg total) by nebulization 2 (two) times daily. 360 mL 3   clofazimine  50 mg CAPS capsule (for compassionate use) Take 100 mg by mouth daily with breakfast.     clotrimazole  (MYCELEX ) 10 MG troche Take 10 mg by mouth 3 (three) times daily.     diazepam  (VALIUM ) 5 MG tablet Take 5 mg by mouth at bedtime as needed.     ethambutol  (MYAMBUTOL ) 400 MG tablet Take 3 tablets (1,200 mg total) by mouth daily. 90 tablet 6   ferrous sulfate  325 (65 FE) MG tablet Take 325 mg by mouth at bedtime.     Fluocinolone Acetonide 0.01 % OIL PLEASE SEE ATTACHED FOR DETAILED DIRECTIONS     folic acid  (FOLVITE ) 1 MG tablet Take 1 mg by mouth daily.     guaiFENesin  (MUCINEX ) 600 MG 12 hr tablet Take 1 tablet (600 mg total) by mouth 2 (two) times daily. 60 tablet 0   hydrocortisone  (CORTEF ) 10 MG tablet Take 2 tablets in the morning and 1 tablet mid afternoon (2 to 4 PM) 90  tablet 0   levothyroxine  (SYNTHROID ) 175 MCG tablet Take 175 mcg by mouth daily before breakfast.     lidocaine -prilocaine  (EMLA ) cream APPLY 1 APPLICATION TOPICALLY AS NEEDED. 30 g 1   magnesium  oxide (MAG-OX) 400 MG tablet Take 800 mg by mouth 2 (two) times daily.     melatonin 3 MG TABS tablet Take 3 mg by mouth at bedtime as needed.     metoprolol  succinate (TOPROL -XL) 25 MG 24 hr tablet Take 12.5-25 mg by mouth daily as needed (Acute High Blood Pressure).     naproxen  sodium (ANAPROX  DS) 550 MG tablet Take 1 tablet (550 mg total) by mouth 2 (two) times daily as needed for moderate pain (pain score 4-6). Take with meals 30 tablet 1   nicotine  (NICODERM CQ  - DOSED IN MG/24 HOURS) 21 mg/24hr patch Place 21 mg onto the skin daily.     nitroGLYCERIN  (NITROSTAT ) 0.4 MG SL tablet Place under the tongue.     OXYGEN Inhale 2 L into the lungs at bedtime.     pantoprazole  (PROTONIX ) 40 MG tablet Take 1 tablet (40 mg total) by mouth 2 (two) times daily before meals     promethazine -dextromethorphan  (PROMETHAZINE -DM) 6.25-15 MG/5ML syrup Take 5 mLs by mouth 4 (four) times daily as needed for cough. 180 mL 1   revefenacin  (YUPELRI ) 175 MCG/3ML nebulizer solution Take 3 mLs (175 mcg total) by nebulization daily. Dx: J44.9 90 mL 0   sodium chloride  1 g tablet Take 5 tablets (5 g total) by mouth 3 (three) times daily with meals.     tadalafil  (CIALIS ) 5 MG tablet Take 1 tablet (5 mg total) by mouth daily as needed for erectile dysfunction. 90 tablet 3   amikacin  (AMIKIN ) 1 GM/4ML SOLN injection Inject into the muscle daily. (Patient not taking: Reported on 02/23/2024)     ARIKAYCE  590 MG/8.4ML SUSP Inhale into the lungs. (Patient not taking: Reported on 02/23/2024)     No current facility-administered medications for this visit.   Facility-Administered Medications Ordered in Other Visits  Medication Dose Route Frequency Provider Last Rate Last Admin   heparin  lock flush 100 unit/mL  500 Units Intravenous  Once Elia Nunley  J, MD        OBJECTIVE: Vitals:   02/23/24 0903  BP: 136/81  Pulse: 95  Resp: 19  Temp: (!) 97.4 F (36.3 C)  SpO2: 99%      Body mass index is 22.91 kg/m.    ECOG FS:2 - Symptomatic, <50% confined to bed  General: Well-developed, well-nourished, no acute distress. Eyes: Pink conjunctiva, anicteric sclera. HEENT: Normocephalic, moist mucous membranes. Lungs: No audible wheezing or coughing. Heart: Regular rate and rhythm. Abdomen: Soft, nontender, no obvious distention. Musculoskeletal: No edema, cyanosis, or clubbing. Neuro: Alert, answering all questions appropriately. Cranial nerves grossly intact. Skin: No rashes or petechiae noted. Psych: Normal affect.  LAB RESULTS:  Lab Results  Component Value Date   NA 137 01/16/2024   K 3.7 01/16/2024   CL 99 01/16/2024   CO2 30 01/16/2024   GLUCOSE 88 01/16/2024   BUN 6 (L) 01/16/2024   CREATININE 0.66 01/16/2024   CALCIUM  8.7 (L) 01/16/2024   PROT 6.3 (L) 11/08/2023   ALBUMIN 3.8 11/08/2023   AST 19 11/08/2023   ALT 17 11/08/2023   ALKPHOS 77 11/08/2023   BILITOT 0.6 11/08/2023   GFRNONAA >60 01/16/2024   GFRAA >60 04/24/2020    Lab Results  Component Value Date   WBC 8.7 11/12/2023   NEUTROABS 9.2 (H) 11/08/2023   HGB 13.2 11/12/2023   HCT 39.1 11/12/2023   MCV 93.3 11/12/2023   PLT 180 11/12/2023   Lab Results  Component Value Date   IRON  49 12/28/2021   TIBC 241 (L) 12/28/2021   IRONPCTSAT 20 12/28/2021   Lab Results  Component Value Date   FERRITIN 318 11/20/2021     STUDIES:  ONCOLOGY HISTORY: Case discussed with pathologist and unable to determine whether this is adenocarcinoma or squamous cell carcinoma.  There is also insufficient tissue to do further testing.  Liquid biopsy did not reveal any actionable mutations.  MRI of the brain completed on Mar 28, 2018 reviewed independently did not reveal metastatic disease.  Patient completed XRT June 26, 2018.  He completed  his concurrent single agent carboplatinum on June 21, 2018.  Patient had a reaction to Taxol  during cycle 1 and this was discontinued.  He completed a year-long of maintenance durvalumab  on June 27, 2019.   PET scan results from November 16, 2021 reviewed independently with once again suspicion of progression of disease.  After lengthy discussion with the patient, it was agreed that no biopsy is necessary and may be too difficult given his recent C2 cervical fracture.  Patient and family expressed interest in systemic chemotherapy.  Patient received his fourth and final cycle of Taxotere  on January 28, 2022.    ASSESSMENT: Recurrent stage IIIa non-small cell lung cancer, right middle lobe.  PLAN:    Recurrent stage IIIa non-small cell lung cancer, right middle lobe: See oncology history above.  Patient's most recent CT scan on February 14, 2024 reviewed independently and reported as above with no obvious evidence of recurrent or progressive disease.  He continues to have an unchanged right upper lobe cavitary lesion and other persistent abnormalities consistent with COPD and his ongoing MAC infection.  Continue follow-up with pulmonary and infectious disease as scheduled.  Return to clinic in 6 months with repeat imaging and further evaluation.   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  Follow-up with ID as scheduled. Iron  deficiency anemia: Resolved.  He last received IV Venofer  on December 03, 2021.   Colon polyps:  Patient  has a personal history of greater then 10 adenomatous polyps on his most recent conoloscopy. He does not know of any family history of increased polyps or colon cancer.  Genetic testing to assess for the APC mutation for FAP or AFAP was negative. Continue colonoscopies as per GI. Tobacco Use: Chronic and unchanged.  Patient continues to smoke on a daily basis.  He previously expressed understanding by continuing tobacco use increases his chance of  recurrence. Anxiety/depression: Chronic and unchanged.  Continue monitoring and treatment per primary care.   Hyponatremia: Resolved.  Patient's most recent sodium level was within normal limits.  Continue salt tablets as prescribed by nephrology.  Patient was previously counseled on alcohol consumption and how it can act as a diuretic and reduce sodium levels.  Follow-up with nephrology as scheduled. Hypothyroidism: Appreciate endocrinology input.  Continue Synthroid  as prescribed.  Follow-up with endocrinology as scheduled. CHF: Patient's most recent cardiac echo on November 10, 2023 revealed an EF of approximately 45% which is slightly improved.  Continue follow-up with cardiology.   History of C2 cervical fracture: Patient had repeat imaging on May 03, 2022 that revealed complete resolution of fracture.   Cough/shortness of breath: Chronic and unchanged.  Continue oxygen as needed.  Patient also continues to smoke.  Follow-up with pulmonary as needed.  Patient reportedly also discussed possible hospice with pulmonary given his end-stage COPD. Port: Okay to have vascular surgery remove port.  Order has been entered.   Patient expressed understanding and was in agreement with this plan. He also understands that He can call clinic at any time with any questions, concerns, or complaints.    Shellie Dials, MD   02/23/2024 9:24 AM

## 2024-02-23 NOTE — Progress Notes (Signed)
 Patient states he is very fatigued. Patient states he has veins popping out and bulging from his port and he went to see dr.dew who placed the port states he believe the tip of port may be around scar tissue which could be causing the veins to pop out and cause the burning pain the patient has reported. Patient would like port out.

## 2024-02-28 ENCOUNTER — Other Ambulatory Visit: Payer: Self-pay | Admitting: Pulmonary Disease

## 2024-02-28 ENCOUNTER — Telehealth (INDEPENDENT_AMBULATORY_CARE_PROVIDER_SITE_OTHER): Payer: Self-pay

## 2024-02-28 ENCOUNTER — Telehealth (INDEPENDENT_AMBULATORY_CARE_PROVIDER_SITE_OTHER): Payer: Self-pay | Admitting: Vascular Surgery

## 2024-02-28 ENCOUNTER — Ambulatory Visit: Attending: Infectious Diseases | Admitting: Infectious Diseases

## 2024-02-28 ENCOUNTER — Encounter

## 2024-02-28 VITALS — BP 115/68 | HR 93 | Temp 97.3°F | Ht 72.0 in | Wt 164.0 lb

## 2024-02-28 DIAGNOSIS — Z923 Personal history of irradiation: Secondary | ICD-10-CM | POA: Insufficient documentation

## 2024-02-28 DIAGNOSIS — E271 Primary adrenocortical insufficiency: Secondary | ICD-10-CM | POA: Insufficient documentation

## 2024-02-28 DIAGNOSIS — Z85118 Personal history of other malignant neoplasm of bronchus and lung: Secondary | ICD-10-CM | POA: Diagnosis not present

## 2024-02-28 DIAGNOSIS — Z716 Tobacco abuse counseling: Secondary | ICD-10-CM | POA: Diagnosis not present

## 2024-02-28 DIAGNOSIS — Z9221 Personal history of antineoplastic chemotherapy: Secondary | ICD-10-CM | POA: Diagnosis not present

## 2024-02-28 DIAGNOSIS — J440 Chronic obstructive pulmonary disease with acute lower respiratory infection: Secondary | ICD-10-CM | POA: Diagnosis not present

## 2024-02-28 DIAGNOSIS — Z515 Encounter for palliative care: Secondary | ICD-10-CM | POA: Diagnosis not present

## 2024-02-28 DIAGNOSIS — Z792 Long term (current) use of antibiotics: Secondary | ICD-10-CM | POA: Insufficient documentation

## 2024-02-28 DIAGNOSIS — E2740 Unspecified adrenocortical insufficiency: Secondary | ICD-10-CM

## 2024-02-28 DIAGNOSIS — Z981 Arthrodesis status: Secondary | ICD-10-CM | POA: Diagnosis not present

## 2024-02-28 DIAGNOSIS — H9109 Ototoxic hearing loss, unspecified ear: Secondary | ICD-10-CM | POA: Diagnosis not present

## 2024-02-28 DIAGNOSIS — I110 Hypertensive heart disease with heart failure: Secondary | ICD-10-CM | POA: Diagnosis not present

## 2024-02-28 DIAGNOSIS — E222 Syndrome of inappropriate secretion of antidiuretic hormone: Secondary | ICD-10-CM | POA: Diagnosis not present

## 2024-02-28 DIAGNOSIS — Z79890 Hormone replacement therapy (postmenopausal): Secondary | ICD-10-CM | POA: Insufficient documentation

## 2024-02-28 DIAGNOSIS — Z7952 Long term (current) use of systemic steroids: Secondary | ICD-10-CM | POA: Insufficient documentation

## 2024-02-28 DIAGNOSIS — F17210 Nicotine dependence, cigarettes, uncomplicated: Secondary | ICD-10-CM | POA: Insufficient documentation

## 2024-02-28 DIAGNOSIS — F17290 Nicotine dependence, other tobacco product, uncomplicated: Secondary | ICD-10-CM | POA: Diagnosis not present

## 2024-02-28 DIAGNOSIS — I509 Heart failure, unspecified: Secondary | ICD-10-CM | POA: Insufficient documentation

## 2024-02-28 DIAGNOSIS — J441 Chronic obstructive pulmonary disease with (acute) exacerbation: Secondary | ICD-10-CM

## 2024-02-28 DIAGNOSIS — D509 Iron deficiency anemia, unspecified: Secondary | ICD-10-CM | POA: Insufficient documentation

## 2024-02-28 DIAGNOSIS — E039 Hypothyroidism, unspecified: Secondary | ICD-10-CM | POA: Diagnosis not present

## 2024-02-28 DIAGNOSIS — Z79899 Other long term (current) drug therapy: Secondary | ICD-10-CM | POA: Diagnosis not present

## 2024-02-28 DIAGNOSIS — Z888 Allergy status to other drugs, medicaments and biological substances status: Secondary | ICD-10-CM | POA: Insufficient documentation

## 2024-02-28 DIAGNOSIS — T365X5S Adverse effect of aminoglycosides, sequela: Secondary | ICD-10-CM | POA: Insufficient documentation

## 2024-02-28 DIAGNOSIS — A310 Pulmonary mycobacterial infection: Secondary | ICD-10-CM | POA: Diagnosis present

## 2024-02-28 DIAGNOSIS — E871 Hypo-osmolality and hyponatremia: Secondary | ICD-10-CM

## 2024-02-28 MED ORDER — BUDESONIDE 0.5 MG/2ML IN SUSP: 0.5000 mg | Freq: Two times a day (BID) | RESPIRATORY_TRACT | 3 refills | Status: DC

## 2024-02-28 NOTE — Progress Notes (Signed)
 Paul Carpenter

## 2024-02-28 NOTE — Patient Instructions (Signed)
 Today, we discussed your ongoing respiratory issues related to Mycobacterium avium complex (MAC) lung infection and Chronic Obstructive Pulmonary Disease (COPD). We reviewed your current treatment plan and made some adjustments to better manage your symptoms and improve your quality of life.  YOUR PLAN:  -MYCOBACTERIUM AVIUM COMPLEX (MAC) LUNG INFECTION: MAC is a type of bacterial infection that affects the lungs. You will continue taking azithromycin , ethambutol , and clofazimine . We will monitor your condition with monthly sputum cultures and consult with Dr. Viva Grise for ongoing management.  -CHRONIC OBSTRUCTIVE PULMONARY DISEASE (COPD) WITH EXACERBATION: COPD is a chronic lung disease that makes it hard to breathe. You will continue using [place], Pulmicort , and Brovana  inhalers, and albuterol  as needed. Pulmonary rehabilitation is recommended to help improve your lung function. It is very important to stop smoking to prevent further damage to your lungs. We will also consider using a hypertonic saline nebulizer to help clear sputum and order a sputum culture to check for bacterial infections.

## 2024-02-28 NOTE — Telephone Encounter (Signed)
 Upcoming appt 03/20/24 with Dr. Viva Grise.

## 2024-02-28 NOTE — Telephone Encounter (Signed)
 Pt seen in March and has follow up appt in May.

## 2024-02-28 NOTE — Progress Notes (Signed)
 NAME: Paul Carpenter  DOB: 1956/10/09  MRN: [id number]  Date/Time: 02/28/2024 9:05 AM  Subjective:  Paul Carpenter is a 68 y.o. 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 His treatment regimen includes clofazimine , azithromycin , and ethambutol .  He was on rifampin  for nearly 1 1/2 yr s and stopped last year at [facility] because of side effects and he was started on clofazamine. Previously, he received amikacin  intravenously, but this was discontinued due to hearing issues. He has since been using inhaled amikacin , recommended to be taken after salbutamol, but this exacerbated his breathing difficulties, and he continues to experience persistent coughing. HE was prescribed amikacin  nebulizer but that made his breathing worse- HE was referred for pulmonary rehab and went 4 tines  Despite treatment, the MAC infection remains present, with a cavity in the lung that has not resolved. He has been on MAC treatment for over three years, and there is uncertainty about the long-term efficacy of continuing azithromycin  and ethambutol .  He has a history of COPD, which significantly contributes to his current respiratory symptoms. He experiences exacerbations of COPD, which have been more pronounced recently. His current medication regimen for COPD includes Yupelry Pulmicort , and Brovana , with albuterol  used as needed. He reports a decline in mobility and increased difficulty with physical activities, such  as gardening, where his oxygen levels dropped to 78%.  He is a current smoker, smoking about a pack and a half per day, and has attempted to reduce smoking by trying vaping. He uses supplemental oxygen at night and has a portable oxygen device for use during the day. He has been trying to remain active despite his limitations, participating in gardening activities with family. He reports feeling worse since a hospitalization in November, where he was restarted on IV amikacin .  His past medical history includes a significant weight loss of 45 pounds, which was associated with rifampin  use, leading to its discontinuation. He has experienced nausea as a side effect of rifampin .    3 sputum for Afb 10/06/22, 10/07/22 and 10/08/22 are neg' But since then multiple sputums sent at different time period has been positive  He is here today to see whether there is any rx that will help with MAC and also to know whether his SOB is due to MAC   Smoking 1 /2 day- cut down from 3  He has not had any alcohol for the past month   Following taken from last note  Patient has a complicated lung history As per Dr. Layton Prima note he was initially diagnosed with non-small cell carcinoma of the lung in 2019. On 04/11/2018 a bronch with EBUS done and biopsy taken of subcarinal mass- it showed  - POSITIVE FOR MALIGNANT CELLS IN A NECROTIC BACKGROUND.  MRI of the brain completed on Mar 28, 2018 did not reveal any metastatic disease.  He completed radiation June 26, 2018.  He completed concurrent single agent carboplatinum on June 21, 2018.  He had a reaction to Taxol  during cycle 1 and this was discontinued.  He completed a year-long maintenance subdural level lab on June 27, 2019.  In July 2022 there was a concern for slight progression of the disease.  PET scan done on 06/03/2021 revealed a spiculated cavitary mass in the apical segment of the right upper lobe with similar SUV which was 3.5 versus 3.31-year ago.  So  this was reassuring as per the radiologist.  However disease progression could not be definitely excluded.   no metastasis seen.  A CT scan done on 07/20/2021 showed continued slow evolution of a thick-walled cavitary mass in the right upper lobe which remains concerning for slow-growing neoplasm.  He had a fall and fractured his C 2 in December , 2022.  He was in [facility] at that time.  CTs chest that was done that showed a large right upper lobe soft tissue mass with similar abnormal tissue involving the right pulmonary hilum extending medially into the ipsilateral mediastinum in keeping with a reported history of primary lung cancer. In January he was hospitalized at [facility] for hyponatremia.  At that time oncologist saw him and got another PET scan.  A bronchoscopy/biopsy could not be done due to C2 fracture.  A PET scan was done on 11/16/2021 and it showed similar size of the cavitary process in the right upper lobe.  Increased size and hypermetabolism compared to the PET on 06/02/2021.  Hypermetabolic pulmonary nodules many of which were new compared to the CAT scan of 10/20/2021.    He was diagnosed to have recurrence and was started on palliative chemo with  taxotere ( 4 cycles) on 11/26/21- completed on 01/28/22 He was hospitalized  02/03/22-02/05/22 for sob and cough and fever and sputum sent which was pseudomonas, but realized after his discharge He saw Dr.[name] on 02/09/22 and received a 2 week course of cipro  for necrotizing pneumonia with pseudomonas. A repeat PET scan done on 02/23/22 showed Thick-walled cavitary mass in the right upper lobe has decreased in overall hypermetabolism, indicative of treatment response. 2. Multifocal areas of nodular consolidation and peribronchovascular nodularity with a waxing and waning appearance, suggesting combination slight improvement in metastatic disease with a superimposed atypical/fungal infectious process. 3. Mildly hypermetabolic low right paratracheal and right  hilar lymph nodes, metastatic or reactive. 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 and he was referred to me for treatment  Past Medical History:  Diagnosis Date   Anginal pain (HCC)    Anxiety    Asthma    Chest pain    CHF (congestive heart failure) (HCC)    Chicken pox    Complication of anesthesia    o2 dropped after neck fusion   COPD (chronic obstructive pulmonary disease) (HCC)    Coronary artery disease    Cough    chronic  clear phlegm   Dysrhythmia    palpitations   GERD (gastroesophageal reflux disease)    h/o reflux/ hoarsness   Hematochezia    Hemorrhoids    History of chickenpox    History of colon polyps    History of Helicobacter pylori infection    Hoarseness    Hypertension    Lung cancer (HCC) 05/2016   Chemo + rad tx's.    Migraines    OSA (obstructive sleep apnea)    has CPAP but does not use   Personal history of tobacco use, presenting hazards to health 03/05/2016   Pneumonia    5/17   Raynaud disease    Raynaud disease    Raynaud's disease  Rotator cuff tear    on right   Shortness of breath dyspnea    Sleep apnea    Ulcer (traumatic) of oral mucosa     Past Surgical History:  Procedure Laterality Date   BACK SURGERY     cervical fusion x 2   CARDIAC CATHETERIZATION     CERVICAL DISCECTOMY     x 2   COLONOSCOPY     COLONOSCOPY N/A 07/25/2015   Procedure: COLONOSCOPY;  Surgeon: Deveron Fly, MD;  Location: [facility] ENDOSCOPY;  Service: Endoscopy;  Laterality: N/A;   COLONOSCOPY WITH PROPOFOL  N/A 10/04/2017   Procedure: COLONOSCOPY WITH PROPOFOL ;  Surgeon: Deveron Fly, MD;  Location: [facility] ENDOSCOPY;  Service: Endoscopy;  Laterality: N/A;   COLONOSCOPY WITH PROPOFOL  N/A 07/10/2020   Procedure: COLONOSCOPY WITH PROPOFOL ;  Surgeon: Toledo, Alphonsus Jeans, MD;  Location: [facility] ENDOSCOPY;  Service: Gastroenterology;  Laterality: N/A;   ELECTROMAGNETIC NAVIGATION BROCHOSCOPY Left 06/28/2016   Procedure: ELECTROMAGNETIC NAVIGATION  BRONCHOSCOPY;  Surgeon: Laine Piggs, MD;  Location: [facility] ORS;  Service: Cardiopulmonary;  Laterality: Left;   ENDOBRONCHIAL ULTRASOUND N/A 04/11/2018   Procedure: ENDOBRONCHIAL ULTRASOUND;  Surgeon: Cleve Dale, MD;  Location: [facility] ORS;  Service: Cardiopulmonary;  Laterality: N/A;   ESOPHAGOGASTRODUODENOSCOPY N/A 07/25/2015   Procedure: ESOPHAGOGASTRODUODENOSCOPY (EGD);  Surgeon: Deveron Fly, MD;  Location: [facility] ENDOSCOPY;  Service: Endoscopy;  Laterality: N/A;   ESOPHAGOGASTRODUODENOSCOPY (EGD) WITH PROPOFOL  N/A 07/10/2020   Procedure: ESOPHAGOGASTRODUODENOSCOPY (EGD) WITH PROPOFOL ;  Surgeon: Toledo, Alphonsus Jeans, MD;  Location: [facility] ENDOSCOPY;  Service: Gastroenterology;  Laterality: N/A;   NASAL SINUS SURGERY     x 2    PORTA CATH INSERTION N/A 04/24/2018   Procedure: PORTA CATH INSERTION;  Surgeon: Celso College, MD;  Location: [facility] INVASIVE CV LAB;  Service: Cardiovascular;  Laterality: N/A;   rotator cuff surgery Right    07/2016   SEPTOPLASTY     SKIN GRAFT      Social History   Socioeconomic History   Marital status: Married    Spouse name: Not on file   Number of children: 3   Years of education: Not on file   Highest education level: Not on file  Occupational History   Occupation: Holiday representative Work  Tobacco Use   Smoking status: Every Day    Current packs/day: 1.50    Average packs/day: 3.0 packs/day for 52.3 years (156.0 ttl pk-yrs)    Types: Cigarettes    Start date: 1973    Passive exposure: Current   Smokeless tobacco: Never  Vaping Use   Vaping status: Never Used  Substance and Sexual Activity   Alcohol use: Not Currently    Alcohol/week: 2.0 standard drinks of alcohol    Types: 2 Standard drinks or equivalent per week    Comment: moderate   Drug use: No   Sexual activity: Not on file  Other Topics Concern   Not on file  Social History Narrative   Not on file   Social Drivers of Health   Financial Resource Strain: Low Risk  (02/08/2024)   Received from  [facility]   Overall Financial Resource Strain (CARDIA)    Difficulty of Paying Living Expenses: Not hard at all  Food Insecurity: No Food Insecurity (02/08/2024)   Received from [facility]   Hunger Vital Sign    Worried About Running Out of Food in the Last Year: Never true    Ran Out of Food in the Last Year: Never true  Transportation  Needs: No Transportation Needs (02/08/2024)   Received from [facility] - Transportation    In the past 12 months, has lack of transportation kept you from medical appointments or from getting medications?: No    Lack of Transportation (Non-Medical): No  Physical Activity: Inactive (10/04/2023)   Received from [facility]   Exercise Vital Sign    Days of Exercise per Week: 0 days    Minutes of Exercise per Session: 0 min  Stress: No Stress Concern Present (10/04/2023)   Received from [facility] - Occupational Stress Questionnaire    Feeling of Stress : Not at all  Social Connections: Socially Integrated (11/09/2023)   Social Connection and Isolation Panel [NHANES]    Frequency of Communication with Friends and Family: More than three times a week    Frequency of Social Gatherings with Friends and Family: More than three times a week    Attends Religious Services: 1 to 4 times per year    Active Member of [employer] or Organizations: No    Attends Engineer, structural: 1 to 4 times per year    Marital Status: Married  Catering manager Violence: Not At Risk (11/09/2023)   Humiliation, Afraid, Rape, and Kick questionnaire    Fear of Current or Ex-Partner: No    Emotionally Abused: No    Physically Abused: No    Sexually Abused: No    Family History  Problem Relation Age of Onset   Heart disease Father    Prostate cancer Father    Heart disease Paternal Grandmother    Heart attack Maternal Grandfather 77   Kidney cancer Neg Hx     Bladder Cancer Neg Hx    Other Neg Hx        pituitary abnormality   Allergies  Allergen Reactions   Lisinopril Rash   Varenicline  Rash   I? Current Outpatient Medications  Medication Sig Dispense Refill   acetaminophen  (TYLENOL ) 500 MG tablet Take 500 mg by mouth daily as needed.     albuterol  (PROVENTIL ) (2.5 MG/3ML) 0.083% nebulizer solution Take 3 mLs (2.5 mg total) by nebulization 4 (four) times daily as needed for wheezing or shortness of breath. 75 mL 11   albuterol  (VENTOLIN  HFA) 108 (90 Base) MCG/ACT inhaler INHALE 2 PUFFS BY MOUTH EVERY 4 HOURS AS NEEDED FOR WHEEZE OR FOR SHORTNESS OF BREATH 6.7 each 2   amikacin  (AMIKIN ) 1 GM/4ML SOLN injection Inject into the muscle daily.     ammonium lactate (LAC-HYDRIN) 12 % lotion Apply topically.     arformoterol  (BROVANA ) 15 MCG/2ML NEBU Take 2 mLs (15 mcg total) by nebulization 2 (two) times daily. Dx: J44.9 120 mL 0   atorvastatin  (LIPITOR) 10 MG tablet Take 10 mg by mouth daily.     azithromycin  (ZITHROMAX ) 500 MG tablet TAKE 1 TABLET (500 MG TOTAL) BY MOUTH DAILY. 30 tablet 6   clofazimine  50 mg CAPS capsule (for compassionate use) Take 100 mg by mouth daily with breakfast.     clotrimazole  (MYCELEX ) 10 MG troche Take 10 mg by mouth 3 (three) times daily.     cyanocobalamin (VITAMIN B12) 1000 MCG/ML injection Inject 1 mL (1,000 mcg total) into the muscle monthly for 180 days     diazepam  (VALIUM ) 5 MG tablet Take 5 mg by mouth at bedtime as needed.     ethambutol  (MYAMBUTOL ) 400 MG tablet Take 3 tablets (1,200 mg total) by  mouth daily. 90 tablet 6   ferrous sulfate  325 (65 FE) MG tablet Take 325 mg by mouth at bedtime.     Fluocinolone Acetonide 0.01 % OIL PLEASE SEE ATTACHED FOR DETAILED DIRECTIONS     folic acid  (FOLVITE ) 1 MG tablet Take 1 mg by mouth daily.     guaiFENesin  (MUCINEX ) 600 MG 12 hr tablet Take 1 tablet (600 mg total) by mouth 2 (two) times daily. 60 tablet 0   hydrocortisone  (CORTEF ) 10 MG tablet Take 2 tablets in  the morning and 1 tablet mid afternoon (2 to 4 PM) 90 tablet 0   levothyroxine  (SYNTHROID ) 175 MCG tablet Take 175 mcg by mouth daily before breakfast.     lidocaine -prilocaine  (EMLA ) cream APPLY 1 APPLICATION TOPICALLY AS NEEDED. 30 g 1   magnesium  oxide (MAG-OX) 400 MG tablet Take 800 mg by mouth 2 (two) times daily.     melatonin 3 MG TABS tablet Take 3 mg by mouth at bedtime as needed.     metoprolol  succinate (TOPROL -XL) 25 MG 24 hr tablet Take 12.5-25 mg by mouth daily as needed (Acute High Blood Pressure).     naproxen  sodium (ANAPROX  DS) 550 MG tablet Take 1 tablet (550 mg total) by mouth 2 (two) times daily as needed for moderate pain (pain score 4-6). Take with meals 30 tablet 1   nicotine  (NICODERM CQ  - DOSED IN MG/24 HOURS) 21 mg/24hr patch Place 21 mg onto the skin daily.     nitroGLYCERIN  (NITROSTAT ) 0.4 MG SL tablet Place under the tongue.     OXYGEN Inhale 2 L into the lungs at bedtime.     pantoprazole  (PROTONIX ) 40 MG tablet Take 1 tablet (40 mg total) by mouth 2 (two) times daily before meals     promethazine -dextromethorphan  (PROMETHAZINE -DM) 6.25-15 MG/5ML syrup Take 5 mLs by mouth 4 (four) times daily as needed for cough. 180 mL 1   revefenacin  (YUPELRI ) 175 MCG/3ML nebulizer solution Take 3 mLs (175 mcg total) by nebulization daily. Dx: J44.9 90 mL 0   sodium chloride  1 g tablet Take 5 tablets (5 g total) by mouth 3 (three) times daily with meals.     tadalafil  (CIALIS ) 5 MG tablet Take 1 tablet (5 mg total) by mouth daily as needed for erectile dysfunction. 90 tablet 3   No current facility-administered medications for this visit.   Facility-Administered Medications Ordered in Other Visits  Medication Dose Route Frequency Provider Last Rate Last Admin   heparin  lock flush 100 unit/mL  500 Units Intravenous Once [name], Kavari J, MD         Abtx:  Anti-infectives (From admission, onward)    None       REVIEW OF SYSTEMS:  Const: No fever or chills. Tired no  energy  Eyes: negative diplopia or visual changes, negative eye pain ENT: negative coryza, negative sore throat Resp: cough, ,dyspnea Cards: negative for chest pain, palpitations, lower extremity edema GU: negative for frequency, dysuria and hematuria GI: poor appetite , weight loss Skin: negative for rash and pruritus Heme: negative for easy bruising and gum/nose bleeding MS: fatigue Neurolo:negative for headaches, dizziness, vertigo, memory problems  Psych:depression  Endocrine: negative for thyroid , diabetes Allergy/Immunology-as above:  VITALS:  BP 115/68   Pulse 93   Temp (!) 97.3 F (36.3 C) (Temporal)   Ht 6' (1.829 m)   Wt 164 lb (74.4 kg)   SpO2 97%   BMI 22.24 kg/m   Weight was 192 in May 2023   PHYSICAL  EXAM:  General: Alert, cooperative, no  distress at rest, coughing  Lungs: b/l air entry Rhonchi b/l- crepts rt side Heart: Regular rate and rhythm, no murmur, rub or gallop. Abdomen: did not examine Extremities: atraumatic, no cyanosis. No edema. No clubbing Skin: No rashes or lesions. Or bruising Lymph: Cervical, supraclavicular normal. Neurologic: Grossly non-focal  Labs    Latest Ref Rng & Units 11/12/2023    5:57 AM 11/09/2023    5:55 AM 11/08/2023    5:10 PM  CBC  WBC 4.0 - 10.5 K/uL 8.7  7.5  9.7   Hemoglobin 13.0 - 17.0 g/dL 40.9  81.1  91.4   Hematocrit 39.0 - 52.0 % 39.1  38.9  41.3   Platelets 150 - 400 K/uL 180  204  213        Latest Ref Rng & Units 01/16/2024    9:27 AM 11/14/2023    6:14 AM 11/13/2023    5:36 AM  CMP  Glucose 70 - 99 mg/dL 88  782  956   BUN 8 - 23 mg/dL 6  13  12    Creatinine 0.61 - 1.24 mg/dL 2.13  0.86  5.78   Sodium 135 - 145 mmol/L 137  131  130   Potassium 3.5 - 5.1 mmol/L 3.7  4.5  4.2   Chloride 98 - 111 mmol/L 99  92  89   CO2 22 - 32 mmol/L 30  31  30    Calcium  8.9 - 10.3 mg/dL 8.7  8.2  8.4     s  ? Impression/Recommendation Mycobacterium Avium Complex (MAC) lung infection   Chronic MAC lung infection  with a cavity in the right lung has been well-managed for two years without significant progression. Current symptoms are due to COPD exacerbation. He is on azithromycin , ethambutol , and clofazimine , with previous intolerance to rifampin . MAC is susceptible to azithromycin  and clofazimine , and the infection remains a reservoir without causing significant issues. It is very likely possible that it will get worse if he stops treatmentContinue azithromycin , ethambutol , and clofazimine . Follow up with Dr.Olivier [facility]   Chronic Obstructive Pulmonary Disease (COPD) with exacerbation   COPD exacerbation is the primary cause of symptoms, including dyspnea and reduced mobility, with significant sputum production. Smoking history contributes to disease progression. He uses Yupelri , Pulmicort , Brovana , and albuterol  as needed. Pulmonary rehabilitation is recommended to improve lung function. Smoking cessation is crucial. Encourage smoking cessation. . Use albuterol  as needed for acute symptoms.  Consider hypertonic saline nebulizer for sputum clearance. Order sputum culture for bacterial infections.  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 Inhaled amikacin  was not tolerated due to respiratory side effects. Discontinued inhaled amikacin .  Lung Cancer with radiation therapy   Cancer treated with radiation has contributed to lung cavity formation. There are no signs of recurrence. Followed by Dr.finnegan   The cavity serves as a MAC reservoir without significant issues.  Goals of Care   Discussed COPD and MAC progression and end-of-life scenarios. He prefers to avoid invasive interventions like intubation, as it would not improve his condition. Palliative care options were discussed for future consideration if symptoms worsen.   Follow-up   Ongoing management and monitoring of COPD and MAC are necessary.  Follow up with Dr.  Viva Grise for COPD management. Follow up with Dr.Olivier for MAC lung management . Ensure attendance at pulmonary rehabilitation sessions.  Hypothyroidism on Synthroid - recently increased  Hyponatremia due to SIADH- recently admitted  for that. On sodium tablets+ magnesium   Adrenal insufficiency-Addison's disease- on hydrocortisone - managed by Dr.Solum Weight loss of 28 pounds since May 2023 when he started MAC treatment    Discussed with patient and his wife in detail Follow up Prn with me   __________________________________________________

## 2024-02-28 NOTE — Telephone Encounter (Signed)
 Spoke with the patient and he is scheduled with Dr. Vonna Guardian on 03/08/24 with a 11:00 am arrival time to the [facility] for a port removal. Pre-procedure instructions were discussed and will be sent to Mychart and mailed.

## 2024-02-28 NOTE — Telephone Encounter (Signed)
 Copied from CRM [id number]. Topic: Clinical - Medication Refill >> Feb 28, 2024 11:24 AM Isabell A wrote: Most Recent Primary Care Visit:   Medication: revefenacin  (YUPELRI ) 175 MCG/3ML nebulizer solution  arformoterol  (BROVANA ) 15 MCG/2ML NEBU    Has the patient contacted their pharmacy? Yes (Agent: If no, request that the patient contact the pharmacy for the refill. If patient does not wish to contact the pharmacy document the reason why and proceed with request.) (Agent: If yes, when and what did the pharmacy advise?)  Is this the correct pharmacy for this prescription? Yes If no, delete pharmacy and type the correct one.  This is the patient's preferred pharmacy:  [id number] - [place], [place] - [address] [place] [address] Phone: [phone] Fax: [phone]  Has the prescription been filled recently? Yes  Is the patient out of the medication? Yes  Has the patient been seen for an appointment in the last year OR does the patient have an upcoming appointment? Yes  Can we respond through MyChart? No  Agent: Please be advised that Rx refills may take up to 3 business days. We ask that you follow-up with your pharmacy.

## 2024-02-28 NOTE — Telephone Encounter (Signed)
 I attempted to contact the patient to schedule a port removal with Dr. Vonna Guardian, a message was left for a return call.

## 2024-02-28 NOTE — Telephone Encounter (Signed)
 Pt wife called stating they need a perm cath removal. They came in as a np consult to see Dr dew for this issue on 01/17/24. Please advise. Pt call back number is [phone]

## 2024-02-28 NOTE — Telephone Encounter (Signed)
 Copied from CRM [id number]. Topic: Clinical - Medication Refill >> Feb 28, 2024 11:28 AM Isabell A wrote: Most Recent Primary Care Visit:   Medication: Pulmicort  0.6ml  Has the patient contacted their pharmacy? Yes (Agent: If no, request that the patient contact the pharmacy for the refill. If patient does not wish to contact the pharmacy document the reason why and proceed with request.) (Agent: If yes, when and what did the pharmacy advise?)  Is this the correct pharmacy for this prescription? Yes If no, delete pharmacy and type the correct one.  This is the patient's preferred pharmacy:  [id number] - [place], [place] - [address] [place] [address] Phone: [phone] Fax: [phone]   Has the prescription been filled recently? No  Is the patient out of the medication? Yes  Has the patient been seen for an appointment in the last year OR does the patient have an upcoming appointment? Yes  Can we respond through MyChart? No  Agent: Please be advised that Rx refills may take up to 3 business days. We ask that you follow-up with your pharmacy.

## 2024-02-28 NOTE — Telephone Encounter (Signed)
 Patient called back and is now scheduled with Dr. Vonna Guardian for a Port removal on 03/08/24 with a 11:00 am arrival time to the [facility]. Pre-procedure instructions were discussed and will be sent to Mychart and mailed.

## 2024-03-01 ENCOUNTER — Encounter

## 2024-03-06 ENCOUNTER — Encounter

## 2024-03-07 ENCOUNTER — Ambulatory Visit: Admitting: Internal Medicine

## 2024-03-07 ENCOUNTER — Encounter: Payer: Self-pay | Admitting: *Deleted

## 2024-03-07 DIAGNOSIS — J449 Chronic obstructive pulmonary disease, unspecified: Secondary | ICD-10-CM

## 2024-03-07 NOTE — Progress Notes (Signed)
 Pulmonary Individual Treatment Plan  Patient Details  Name: Paul Carpenter MRN: [id number] Date of Birth: Apr 26, 1956 Referring Provider:   Flowsheet Row Pulmonary Rehab from 01/19/2024 in [facility] Cardiac and Pulmonary Rehab  Referring Provider Dr. Merlene Stark       Initial Encounter Date:  Flowsheet Row Pulmonary Rehab from 01/19/2024 in [facility] Cardiac and Pulmonary Rehab  Date 01/19/24       Visit Diagnosis: Chronic obstructive pulmonary disease, unspecified COPD type (HCC)  Patient's Home Medications on Admission:  Current Outpatient Medications:    acetaminophen  (TYLENOL ) 500 MG tablet, Take 500 mg by mouth daily as needed., Disp: , Rfl:    albuterol  (PROVENTIL ) (2.5 MG/3ML) 0.083% nebulizer solution, Take 3 mLs (2.5 mg total) by nebulization 4 (four) times daily as needed for wheezing or shortness of breath., Disp: 75 mL, Rfl: 11   albuterol  (VENTOLIN  HFA) 108 (90 Base) MCG/ACT inhaler, INHALE 2 PUFFS BY MOUTH EVERY 4 HOURS AS NEEDED FOR WHEEZE OR FOR SHORTNESS OF BREATH, Disp: 6.7 each, Rfl: 2   amikacin  (AMIKIN ) 1 GM/4ML SOLN injection, Inject into the muscle daily., Disp: , Rfl:    ammonium lactate (LAC-HYDRIN) 12 % lotion, Apply topically., Disp: , Rfl:    arformoterol  (BROVANA ) 15 MCG/2ML NEBU, TAKE 2 MLS (15 MCG TOTAL) BY NEBULIZATION 2 (TWO) TIMES DAILY. DX: J44.9, Disp: 120 mL, Rfl: 0   atorvastatin  (LIPITOR) 10 MG tablet, Take 10 mg by mouth daily., Disp: , Rfl:    azithromycin  (ZITHROMAX ) 500 MG tablet, TAKE 1 TABLET (500 MG TOTAL) BY MOUTH DAILY., Disp: 30 tablet, Rfl: 6   budesonide  (PULMICORT ) 0.5 MG/2ML nebulizer solution, Take 2 mLs (0.5 mg total) by nebulization 2 (two) times daily., Disp: 360 mL, Rfl: 3   clofazimine  50 mg CAPS capsule (for compassionate use), Take 100 mg by mouth daily with breakfast., Disp: , Rfl:    clotrimazole  (MYCELEX ) 10 MG troche, Take 10 mg by mouth 3 (three) times daily., Disp: , Rfl:    cyanocobalamin (VITAMIN B12) 1000 MCG/ML  injection, Inject 1 mL (1,000 mcg total) into the muscle monthly for 180 days, Disp: , Rfl:    diazepam  (VALIUM ) 5 MG tablet, Take 5 mg by mouth at bedtime as needed., Disp: , Rfl:    ethambutol  (MYAMBUTOL ) 400 MG tablet, Take 3 tablets (1,200 mg total) by mouth daily., Disp: 90 tablet, Rfl: 6   ferrous sulfate  325 (65 FE) MG tablet, Take 325 mg by mouth at bedtime., Disp: , Rfl:    Fluocinolone Acetonide 0.01 % OIL, PLEASE SEE ATTACHED FOR DETAILED DIRECTIONS, Disp: , Rfl:    folic acid  (FOLVITE ) 1 MG tablet, Take 1 mg by mouth daily., Disp: , Rfl:    guaiFENesin  (MUCINEX ) 600 MG 12 hr tablet, Take 1 tablet (600 mg total) by mouth 2 (two) times daily., Disp: 60 tablet, Rfl: 0   hydrocortisone  (CORTEF ) 10 MG tablet, Take 2 tablets in the morning and 1 tablet mid afternoon (2 to 4 PM), Disp: 90 tablet, Rfl: 0   levothyroxine  (SYNTHROID ) 175 MCG tablet, Take 175 mcg by mouth daily before breakfast., Disp: , Rfl:    lidocaine -prilocaine  (EMLA ) cream, APPLY 1 APPLICATION TOPICALLY AS NEEDED., Disp: 30 g, Rfl: 1   magnesium  oxide (MAG-OX) 400 MG tablet, Take 800 mg by mouth 2 (two) times daily., Disp: , Rfl:    melatonin 3 MG TABS tablet, Take 3 mg by mouth at bedtime as needed., Disp: , Rfl:    metoprolol  succinate (TOPROL -XL) 25 MG 24 hr tablet,  Take 12.5-25 mg by mouth daily as needed (Acute High Blood Pressure)., Disp: , Rfl:    naproxen  sodium (ANAPROX  DS) 550 MG tablet, Take 1 tablet (550 mg total) by mouth 2 (two) times daily as needed for moderate pain (pain score 4-6). Take with meals, Disp: 30 tablet, Rfl: 1   nicotine  (NICODERM CQ  - DOSED IN MG/24 HOURS) 21 mg/24hr patch, Place 21 mg onto the skin daily., Disp: , Rfl:    nitroGLYCERIN  (NITROSTAT ) 0.4 MG SL tablet, Place under the tongue., Disp: , Rfl:    OXYGEN, Inhale 2 L into the lungs at bedtime., Disp: , Rfl:    pantoprazole  (PROTONIX ) 40 MG tablet, Take 1 tablet (40 mg total) by mouth 2 (two) times daily before meals, Disp: , Rfl:     promethazine -dextromethorphan  (PROMETHAZINE -DM) 6.25-15 MG/5ML syrup, Take 5 mLs by mouth 4 (four) times daily as needed for cough., Disp: 180 mL, Rfl: 1   sodium chloride  1 g tablet, Take 5 tablets (5 g total) by mouth 3 (three) times daily with meals., Disp: , Rfl:    tadalafil  (CIALIS ) 5 MG tablet, Take 1 tablet (5 mg total) by mouth daily as needed for erectile dysfunction., Disp: 90 tablet, Rfl: 3   YUPELRI  175 MCG/3ML nebulizer solution, TAKE 3 MLS (175 MCG TOTAL) BY NEBULIZATION DAILY. DX: J44.9, Disp: 90 mL, Rfl: 0 No current facility-administered medications for this visit.  Facility-Administered Medications Ordered in Other Visits:    heparin  lock flush 100 unit/mL, 500 Units, Intravenous, Once, [name], Deadra Everts, MD  Past Medical History: Past Medical History:  Diagnosis Date   Anginal pain (HCC)    Anxiety    Asthma    Chest pain    CHF (congestive heart failure) (HCC)    Chicken pox    Complication of anesthesia    o2 dropped after neck fusion   COPD (chronic obstructive pulmonary disease) (HCC)    Coronary artery disease    Cough    chronic  clear phlegm   Dysrhythmia    palpitations   GERD (gastroesophageal reflux disease)    h/o reflux/ hoarsness   Hematochezia    Hemorrhoids    History of chickenpox    History of colon polyps    History of Helicobacter pylori infection    Hoarseness    Hypertension    Lung cancer (HCC) 05/2016   Chemo + rad tx's.    Migraines    OSA (obstructive sleep apnea)    has CPAP but does not use   Personal history of tobacco use, presenting hazards to health 03/05/2016   Pneumonia    5/17   Raynaud disease    Raynaud disease    Raynaud's disease    Rotator cuff tear    on right   Shortness of breath dyspnea    Sleep apnea    Ulcer (traumatic) of oral mucosa     Tobacco Use: Social History   Tobacco Use  Smoking Status Every Day   Current packs/day: 1.50   Average packs/day: 3.0 packs/day for 52.3 years (156.0 ttl  pk-yrs)   Types: Cigarettes   Start date: 1973   Passive exposure: Current  Smokeless Tobacco Never    Labs: Review Flowsheet        No data to display           Pulmonary Assessment Scores:  Pulmonary Assessment Scores     Row Name 01/26/24 0926         ADL UCSD  ADL Phase Entry     SOB Score total 111     Rest 4     Walk 5     Stairs 5     Bath 5     Dress 5     Shop 5       CAT Score   CAT Score 38              UCSD: Self-administered rating of dyspnea associated with activities of daily living (ADLs) 6-point scale (0 = "not at all" to 5 = "maximal or unable to do because of breathlessness")  Scoring Scores range from 0 to 120.  Minimally important difference is 5 units  CAT: CAT can identify the health impairment of COPD patients and is better correlated with disease progression.  CAT has a scoring range of zero to 40. The CAT score is classified into four groups of low (less than 10), medium (10 - 20), high (21-30) and very high (31-40) based on the impact level of disease on health status. A CAT score over 10 suggests significant symptoms.  A worsening CAT score could be explained by an exacerbation, poor medication adherence, poor inhaler technique, or progression of COPD or comorbid conditions.  CAT MCID is 2 points  mMRC: mMRC (Modified Medical Research Council) Dyspnea Scale is used to assess the degree of baseline functional disability in patients of respiratory disease due to dyspnea. No minimal important difference is established. A decrease in score of 1 point or greater is considered a positive change.   Pulmonary Function Assessment:   Exercise Target Goals: Exercise Program Goal: Individual exercise prescription set using results from initial 6 min walk test and THRR while considering  patient's activity barriers and safety.   Exercise Prescription Goal: Initial exercise prescription builds to 30-45 minutes a day of aerobic activity,  2-3 days per week.  Home exercise guidelines will be given to patient during program as part of exercise prescription that the participant will acknowledge.  Education: Aerobic Exercise: - Group verbal and visual presentation on the components of exercise prescription. Introduces F.I.T.T principle from ACSM for exercise prescriptions.  Reviews F.I.T.T. principles of aerobic exercise including progression. Written material given at graduation.   Education: Resistance Exercise: - Group verbal and visual presentation on the components of exercise prescription. Introduces F.I.T.T principle from ACSM for exercise prescriptions  Reviews F.I.T.T. principles of resistance exercise including progression. Written material given at graduation.    Education: Exercise & Equipment Safety: - Individual verbal instruction and demonstration of equipment use and safety with use of the equipment. Flowsheet Row Pulmonary Rehab from 01/19/2024 in [facility] Cardiac and Pulmonary Rehab  Date 01/19/24  Educator [facility]  Instruction Review Code 1- Verbalizes Understanding       Education: Exercise Physiology & General Exercise Guidelines: - Group verbal and written instruction with models to review the exercise physiology of the cardiovascular system and associated critical values. Provides general exercise guidelines with specific guidelines to those with heart or lung disease.    Education: Flexibility, Balance, Mind/Body Relaxation: - Group verbal and visual presentation with interactive activity on the components of exercise prescription. Introduces F.I.T.T principle from ACSM for exercise prescriptions. Reviews F.I.T.T. principles of flexibility and balance exercise training including progression. Also discusses the mind body connection.  Reviews various relaxation techniques to help reduce and manage stress (i.e. Deep breathing, progressive muscle relaxation, and visualization). Balance handout provided to take home.  Written material given at graduation.   Activity Barriers &  Risk Stratification:  Activity Barriers & Cardiac Risk Stratification - 01/19/24 1420       Activity Barriers & Cardiac Risk Stratification   Activity Barriers Deconditioning;Muscular Weakness;Shortness of Breath             6 Minute Walk:  6 Minute Walk     Row Name 01/19/24 1417         6 Minute Walk   Phase Initial     Distance 625 feet     Walk Time 6 minutes     # of Rest Breaks 0     MPH 1.18     METS 2.3     RPE 15     Perceived Dyspnea  3     VO2 Peak 7.97     Symptoms No     Resting HR 93 bpm     Resting BP 112/58     Resting Oxygen Saturation  94 %     Exercise Oxygen Saturation  during 6 min walk 92 %     Max Ex. HR 105 bpm     Max Ex. BP 124/64     2 Minute Post BP 114/62       Interval HR   1 Minute HR 101     2 Minute HR 102     3 Minute HR 104     4 Minute HR 105     5 Minute HR 104     6 Minute HR 97     2 Minute Post HR 97     Interval Heart Rate? Yes       Interval Oxygen   Interval Oxygen? Yes     Baseline Oxygen Saturation % 94 %     1 Minute Oxygen Saturation % 94 %     1 Minute Liters of Oxygen 0 L     2 Minute Oxygen Saturation % 94 %     2 Minute Liters of Oxygen 0 L     3 Minute Oxygen Saturation % 94 %     3 Minute Liters of Oxygen 0 L     4 Minute Oxygen Saturation % 92 %     4 Minute Liters of Oxygen 0 L     5 Minute Oxygen Saturation % 92 %     5 Minute Liters of Oxygen 0 L     6 Minute Oxygen Saturation % 96 %     6 Minute Liters of Oxygen 0 L     2 Minute Post Oxygen Saturation % 95 %     2 Minute Post Liters of Oxygen 0 L             Oxygen Initial Assessment:  Oxygen Initial Assessment - 01/19/24 1426       Home Oxygen   Home Oxygen Device Home Concentrator    Sleep Oxygen Prescription Continuous    Liters per minute 3    Home Exercise Oxygen Prescription Continuous    Liters per minute 3    Home Resting Oxygen Prescription None       Initial 6 min Walk   Oxygen Used None      Program Oxygen Prescription   Program Oxygen Prescription None      Intervention   Short Term Goals To learn and exhibit compliance with exercise, home and travel O2 prescription;To learn and understand importance of monitoring SPO2 with pulse oximeter and demonstrate accurate use of the pulse oximeter.;To learn and understand importance  of maintaining oxygen saturations>88%;To learn and demonstrate proper pursed lip breathing techniques or other breathing techniques. ;To learn and demonstrate proper use of respiratory medications    Long  Term Goals Exhibits compliance with exercise, home  and travel O2 prescription;Verbalizes importance of monitoring SPO2 with pulse oximeter and return demonstration;Maintenance of O2 saturations>88%;Compliance with respiratory medication;Exhibits proper breathing techniques, such as pursed lip breathing or other method taught during program session;Demonstrates proper use of MDI's             Oxygen Re-Evaluation:  Oxygen Re-Evaluation     Row Name 01/26/24 0928             Program Oxygen Prescription   Program Oxygen Prescription None         Home Oxygen   Home Oxygen Device Home Concentrator       Sleep Oxygen Prescription Continuous       Liters per minute 3       Home Exercise Oxygen Prescription Continuous       Liters per minute 3       Home Resting Oxygen Prescription None       Compliance with Home Oxygen Use Yes         Goals/Expected Outcomes   Short Term Goals To learn and demonstrate proper pursed lip breathing techniques or other breathing techniques.        Long  Term Goals Exhibits proper breathing techniques, such as pursed lip breathing or other method taught during program session       Comments Reviewed PLB technique with pt.  Talked about how it works and it's importance in maintaining their exercise saturations.       Goals/Expected Outcomes Short: Become more profiecient at  using PLB. Long: Become independent at using PLB.                Oxygen Discharge (Final Oxygen Re-Evaluation):  Oxygen Re-Evaluation - 01/26/24 0928       Program Oxygen Prescription   Program Oxygen Prescription None      Home Oxygen   Home Oxygen Device Home Concentrator    Sleep Oxygen Prescription Continuous    Liters per minute 3    Home Exercise Oxygen Prescription Continuous    Liters per minute 3    Home Resting Oxygen Prescription None    Compliance with Home Oxygen Use Yes      Goals/Expected Outcomes   Short Term Goals To learn and demonstrate proper pursed lip breathing techniques or other breathing techniques.     Long  Term Goals Exhibits proper breathing techniques, such as pursed lip breathing or other method taught during program session    Comments Reviewed PLB technique with pt.  Talked about how it works and it's importance in maintaining their exercise saturations.    Goals/Expected Outcomes Short: Become more profiecient at using PLB. Long: Become independent at using PLB.             Initial Exercise Prescription:  Initial Exercise Prescription - 01/19/24 1400       Date of Initial Exercise RX and Referring Provider   Date 01/19/24    Referring Provider Dr. Merlene Stark      Oxygen   Oxygen Continuous    Liters 0   As needed   Maintain Oxygen Saturation 88% or higher      Recumbant Bike   Level 1    RPM 50    Watts 25    Minutes 15  METs 2.3      NuStep   Level 1    SPM 80    Minutes 15    METs 2.3      REL-XR   Level 1    Watts 25    Speed 25    Minutes 15    METs 2.3      Intensity   THRR 40-80% of Max Heartrate 117-141    Ratings of Perceived Exertion 11-13    Perceived Dyspnea 0-4      Resistance Training   Training Prescription Yes    Weight 4lb    Reps 10-15             Perform Capillary Blood Glucose checks as needed.  Exercise Prescription Changes:   Exercise Prescription Changes     Row  Name 01/19/24 1400 02/02/24 1100 02/16/24 1200         Response to Exercise   Blood Pressure (Admit) 112/58 128/84 122/60     Blood Pressure (Exercise) 124/64 138/58 130/62     Blood Pressure (Exit) 114/62 112/62 122/68     Heart Rate (Admit) 93 bpm 92 bpm 104 bpm     Heart Rate (Exercise) 105 bpm 93 bpm 104 bpm     Heart Rate (Exit) 97 bpm 88 bpm 99 bpm     Oxygen Saturation (Admit) 94 % 98 % 95 %     Oxygen Saturation (Exercise) 92 % 95 % 96 %     Oxygen Saturation (Exit) 95 % 95 % 97 %     Rating of Perceived Exertion (Exercise) 15 13 14      Perceived Dyspnea (Exercise) 3 3 0     Symptoms none none none     Comments results First full day of exercise --     Duration -- Progress to 30 minutes of  aerobic without signs/symptoms of physical distress Progress to 30 minutes of  aerobic without signs/symptoms of physical distress     Intensity -- THRR unchanged THRR unchanged       Progression   Progression -- Continue to progress workloads to maintain intensity without signs/symptoms of physical distress. Continue to progress workloads to maintain intensity without signs/symptoms of physical distress.     Average METs -- 2.08 2.27       Resistance Training   Training Prescription -- Yes Yes     Weight -- 4 lb 4 lb     Reps -- 10-15 10-15       Interval Training   Interval Training -- No No       Oxygen   Oxygen -- Continuous Continuous     Liters -- 0  As needed 0       Recumbant Bike   Level -- 1 1     Watts -- 25 25     Minutes -- 15 15     METs -- 3.05 3.04       NuStep   Level -- -- 2     Minutes -- -- 15     METs -- -- 1.5       T5 Nustep   Level -- 1 --     Minutes -- 15 --     METs -- 1.1 --       Oxygen   Maintain Oxygen Saturation -- 88% or higher 88% or higher              Exercise Comments:   Exercise Comments  Row Name 01/26/24 [phone]           Exercise Comments First full day of exercise!  Patient was oriented to gym and  equipment including functions, settings, policies, and procedures.  Patient's individual exercise prescription and treatment plan were reviewed.  All starting workloads were established based on the results of the 6 minute walk test done at initial orientation visit.  The plan for exercise progression was also introduced and progression will be customized based on patient's performance and goals.                Exercise Goals and Review:   Exercise Goals     Row Name 01/19/24 1424             Exercise Goals   Increase Physical Activity Yes       Intervention Provide advice, education, support and counseling about physical activity/exercise needs.;Develop an individualized exercise prescription for aerobic and resistive training based on initial evaluation findings, risk stratification, comorbidities and participant's personal goals.       Expected Outcomes Short Term: Attend rehab on a regular basis to increase amount of physical activity.;Long Term: Exercising regularly at least 3-5 days a week.;Long Term: Add in home exercise to make exercise part of routine and to increase amount of physical activity.       Increase Strength and Stamina Yes       Intervention Provide advice, education, support and counseling about physical activity/exercise needs.;Develop an individualized exercise prescription for aerobic and resistive training based on initial evaluation findings, risk stratification, comorbidities and participant's personal goals.       Expected Outcomes Short Term: Increase workloads from initial exercise prescription for resistance, speed, and METs.;Short Term: Perform resistance training exercises routinely during rehab and add in resistance training at home;Long Term: Improve cardiorespiratory fitness, muscular endurance and strength as measured by increased METs and functional capacity ( )       Able to understand and use rate of perceived exertion (RPE) scale Yes        Intervention Provide education and explanation on how to use RPE scale       Expected Outcomes Short Term: Able to use RPE daily in rehab to express subjective intensity level;Long Term:  Able to use RPE to guide intensity level when exercising independently       Able to understand and use Dyspnea scale Yes       Intervention Provide education and explanation on how to use Dyspnea scale       Expected Outcomes Short Term: Able to use Dyspnea scale daily in rehab to express subjective sense of shortness of breath during exertion;Long Term: Able to use Dyspnea scale to guide intensity level when exercising independently       Knowledge and understanding of Target Heart Rate Range (THRR) Yes       Intervention Provide education and explanation of THRR including how the numbers were predicted and where they are located for reference       Expected Outcomes Short Term: Able to state/look up THRR;Short Term: Able to use daily as guideline for intensity in rehab;Long Term: Able to use THRR to govern intensity when exercising independently       Able to check pulse independently Yes       Intervention Provide education and demonstration on how to check pulse in carotid and radial arteries.;Review the importance of being able to check your own pulse for safety during independent exercise  Expected Outcomes Long Term: Able to check pulse independently and accurately;Short Term: Able to explain why pulse checking is important during independent exercise       Understanding of Exercise Prescription Yes       Intervention Provide education, explanation, and written materials on patient's individual exercise prescription       Expected Outcomes Short Term: Able to explain program exercise prescription;Long Term: Able to explain home exercise prescription to exercise independently                Exercise Goals Re-Evaluation :  Exercise Goals Re-Evaluation     Row Name 01/26/24 [phone] 02/02/24 1111  02/16/24 1239 02/29/24 0832       Exercise Goal Re-Evaluation   Exercise Goals Review Able to understand and use rate of perceived exertion (RPE) scale;Able to understand and use Dyspnea scale;Knowledge and understanding of Target Heart Rate Range (THRR);Understanding of Exercise Prescription Increase Physical Activity;Increase Strength and Stamina;Understanding of Exercise Prescription Increase Physical Activity;Increase Strength and Stamina;Understanding of Exercise Prescription Increase Physical Activity;Increase Strength and Stamina;Understanding of Exercise Prescription    Comments Reviewed RPE and dyspnea scale, THR and program prescription with pt today.  Pt voiced understanding and was given a copy of goals to take home. Wandalee Gust is off to a good start in the program. He was able to work at level 1 on both the recumbent bike and T5 nustep during his first session in rehab. He also did well with 4 lb hand weights for resistance training. We will continue to monitor his progress in the program. Wandalee Gust is doing well in the program. he was only able to attend one session during this review period. During his one session he was able to increase to level 2 on the T4 nustep. He was subsequently placed on medical hold, and we will continue to stay in contact. We will continue to monitor his progress upon his return. Tim in on a medical hold due to new medication complications. He last attended on 02/02/2024 and we will stay in contact to determine when able to return. We will monitor his progress upon his return.    Expected Outcomes Short: Use RPE daily to regulate intensity. Long: Follow program prescription in THR. Short: Continue to follow current exercise prescription. Long: Continue exercise to improve strength and stamina. Short: Continue to follow current exercise prescription. Long: Continue exercise to improve strength and stamina. Short: Return to rehab when able. Long: Continue to exercise to improve  strength and stamina             Discharge Exercise Prescription (Final Exercise Prescription Changes):  Exercise Prescription Changes - 02/16/24 1200       Response to Exercise   Blood Pressure (Admit) 122/60    Blood Pressure (Exercise) 130/62    Blood Pressure (Exit) 122/68    Heart Rate (Admit) 104 bpm    Heart Rate (Exercise) 104 bpm    Heart Rate (Exit) 99 bpm    Oxygen Saturation (Admit) 95 %    Oxygen Saturation (Exercise) 96 %    Oxygen Saturation (Exit) 97 %    Rating of Perceived Exertion (Exercise) 14    Perceived Dyspnea (Exercise) 0    Symptoms none    Duration Progress to 30 minutes of  aerobic without signs/symptoms of physical distress    Intensity THRR unchanged      Progression   Progression Continue to progress workloads to maintain intensity without signs/symptoms of physical distress.    Average  METs 2.27      Resistance Training   Training Prescription Yes    Weight 4 lb    Reps 10-15      Interval Training   Interval Training No      Oxygen   Oxygen Continuous    Liters 0      Recumbant Bike   Level 1    Watts 25    Minutes 15    METs 3.04      NuStep   Level 2    Minutes 15    METs 1.5      Oxygen   Maintain Oxygen Saturation 88% or higher             Nutrition:  Target Goals: Understanding of nutrition guidelines, daily intake of sodium 1500mg , cholesterol 200mg , calories 30% from fat and 7% or less from saturated fats, daily to have 5 or more servings of fruits and vegetables.  Education: All About Nutrition: -Group instruction provided by verbal, written material, interactive activities, discussions, models, and posters to present general guidelines for heart healthy nutrition including fat, fiber, MyPlate, the role of sodium in heart healthy nutrition, utilization of the nutrition label, and utilization of this knowledge for meal planning. Follow up email sent as well. Written material given at graduation. Flowsheet  Row Pulmonary Rehab from 01/26/2024 in [facility] Card[facility] Pulmonary Rehab  Education need identified 01/26/24       Biometrics:  Pre Biometrics - 01/19/24 1424       Pre Biometrics   Height 5' 10.87" (1.8 m)    Weight 165 lb 9.6 oz (75.1 kg)    Waist Circumference 36 inches    Hip Circumference 34.5 inches    Waist to Hip Ratio 1.04 %    BMI (Calculated) 23.18    Single Leg Stand 5.5 seconds              Nutrition Therapy Plan and Nutrition Goals:   Nutrition Assessments:  MEDIFICTS Score Key: >=70 Need to make dietary changes  40-70 Heart Healthy Diet <= 40 Therapeutic Level Cholesterol Diet  Flowsheet Row Pulmonary Rehab from 01/18/2024 in [facility] Cardiac and Pulmonary Rehab  Picture Your Plate Total Score on Admission 39      Picture Your Plate Scores: <56 Unhealthy dietary pattern with much room for improvement. 41-50 Dietary pattern unlikely to meet recommendations for good health and room for improvement. 51-60 More healthful dietary pattern, with some room for improvement.  >60 Healthy dietary pattern, although there may be some specific behaviors that could be improved.   Nutrition Goals Re-Evaluation:   Nutrition Goals Discharge (Final Nutrition Goals Re-Evaluation):   Psychosocial: Target Goals: Acknowledge presence or absence of significant depression and/or stress, maximize coping skills, provide positive support system. Participant is able to verbalize types and ability to use techniques and skills needed for reducing stress and depression.   Education: Stress, Anxiety, and Depression - Group verbal and visual presentation to define topics covered.  Reviews how body is impacted by stress, anxiety, and depression.  Also discusses healthy ways to reduce stress and to treat/manage anxiety and depression.  Written material given at graduation.   Education: Sleep Hygiene -Provides group verbal and written instruction about how sleep can affect your  health.  Define sleep hygiene, discuss sleep cycles and impact of sleep habits. Review good sleep hygiene tips.    Initial Review & Psychosocial Screening:  Initial Psych Review & Screening - 01/18/24 1039  Initial Review   Current issues with Current Stress Concerns;Current Sleep Concerns    Source of Stress Concerns Chronic Illness;Unable to participate in former interests or hobbies;Unable to perform yard/household activities      Family Dynamics   Good Support System? Yes   family     Barriers   Psychosocial barriers to participate in program The patient should benefit from training in stress management and relaxation.      Screening Interventions   Interventions Encouraged to exercise;Provide feedback about the scores to participant;To provide support and resources with identified psychosocial needs    Expected Outcomes Short Term goal: Utilizing psychosocial counselor, staff and physician to assist with identification of specific Stressors or current issues interfering with healing process. Setting desired goal for each stressor or current issue identified.;Long Term Goal: Stressors or current issues are controlled or eliminated.;Short Term goal: Identification and review with participant of any Quality of Life or Depression concerns found by scoring the questionnaire.;Long Term goal: The participant improves quality of Life and PHQ9 Scores as seen by post scores and/or verbalization of changes             Quality of Life Scores:  Scores of 19 and below usually indicate a poorer quality of life in these areas.  A difference of  2-3 points is a clinically meaningful difference.  A difference of 2-3 points in the total score of the Quality of Life Index has been associated with significant improvement in overall quality of life, self-image, physical symptoms, and general health in studies assessing change in quality of life.  PHQ-9: Review Flowsheet  More data exists       01/19/2024 03/10/2023 10/05/2022 07/22/2022 07/20/2022  Depression screen PHQ 2/9  Decreased Interest 2 0 0 1 0  Down, Depressed, Hopeless 0 1 1 1  0  PHQ - 2 Score 2 1 1 2  0  Altered sleeping 2 - - 1 -  Tired, decreased energy 3 - - 2 -  Change in appetite 2 - - 3 -  Feeling bad or failure about yourself  2 - - 0 -  Trouble concentrating 1 - - 0 -  Moving slowly or fidgety/restless 3 - - 0 -  Suicidal thoughts 0 - - 0 -  PHQ-9 Score 15 - - 8 -  Difficult doing work/chores Extremely dIfficult - - Somewhat difficult -   Interpretation of Total Score  Total Score Depression Severity:  1-4 = Minimal depression, 5-9 = Mild depression, 10-14 = Moderate depression, 15-19 = Moderately severe depression, 20-27 = Severe depression   Psychosocial Evaluation and Intervention:  Psychosocial Evaluation - 01/18/24 1057       Psychosocial Evaluation & Interventions   Interventions Encouraged to exercise with the program and follow exercise prescription;Stress management education;Relaxation education    Comments Mr. Delacerda is coming to pulmonary rehab with COPD. 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 He said he takes 41 pills and nebulizer treatments that wear him out. He is tired and unable to do what he used to do. He would like to get out to his job sites more, but is limited due to breathing and appointments. He currently is dealing with an infection around is port site which his doctors are managing. He wears oxygen as needed, but sometimes still struggles to keep it up during the night  which alters his sleep. His wife and children are his main support system. He enjoys gardening when he has the time    Expected Outcomes Short: attend pulmonary rehab for educaiton and exercise Long: develop and maintain positive self care habits    Continue Psychosocial Services  Follow up  required by staff             Psychosocial Re-Evaluation:   Psychosocial Discharge (Final Psychosocial Re-Evaluation):   Education: Education Goals: Education classes will be provided on a weekly basis, covering required topics. Participant will state understanding/return demonstration of topics presented.  Learning Barriers/Preferences:  Learning Barriers/Preferences - 01/18/24 1038       Learning Barriers/Preferences   Learning Barriers None    Learning Preferences None             General Pulmonary Education Topics:  Infection Prevention: - Provides verbal and written material to individual with discussion of infection control including proper hand washing and proper equipment cleaning during exercise session. Flowsheet Row Pulmonary Rehab from 01/19/2024 in [facility] Cardiac and Pulmonary Rehab  Date 01/19/24  Educator [facility]  Instruction Review Code 1- Verbalizes Understanding       Falls Prevention: - Provides verbal and written material to individual with discussion of falls prevention and safety. Flowsheet Row Pulmonary Rehab from 01/19/2024 in [facility] Cardiac and Pulmonary Rehab  Date 01/19/24  Educator [facility]  Instruction Review Code 1- Verbalizes Understanding       Chronic Lung Disease Review: - Group verbal instruction with posters, models, PowerPoint presentations and videos,  to review new updates, new respiratory medications, new advancements in procedures and treatments. Providing information on websites and "800" numbers for continued self-education. Includes information about supplement oxygen, available portable oxygen systems, continuous and intermittent flow rates, oxygen safety, concentrators, and Medicare reimbursement for oxygen. Explanation of Pulmonary Drugs, including class, frequency, complications, importance of spacers, rinsing mouth after steroid MDI's, and proper cleaning methods for nebulizers. Review of basic lung anatomy and physiology related  to function, structure, and complications of lung disease. Review of risk factors. Discussion about methods for diagnosing sleep apnea and types of masks and machines for OSA. Includes a review of the use of types of environmental controls: home humidity, furnaces, filters, dust mite/pet prevention, HEPA vacuums. Discussion about weather changes, air quality and the benefits of nasal washing. Instruction on Warning signs, infection symptoms, calling MD promptly, preventive modes, and value of vaccinations. Review of effective airway clearance, coughing and/or vibration techniques. Emphasizing that all should Create an Action Plan. Written material given at graduation. Flowsheet Row Pulmonary Rehab from 01/26/2024 in [facility] Cardiac and Pulmonary Rehab  Education need identified 01/26/24       AED/CPR: - Group verbal and written instruction with the use of models to demonstrate the basic use of the AED with the basic ABC's of resuscitation.    Anatomy and Cardiac Procedures: - Group verbal and visual presentation and models provide information about basic cardiac anatomy and function. Reviews the testing methods done to diagnose heart disease and the outcomes of the test results. Describes the treatment choices: Medical Management, Angioplasty, or Coronary Bypass Surgery for treating various heart conditions including Myocardial Infarction, Angina, Valve Disease, and Cardiac Arrhythmias.  Written material given at graduation.   Medication Safety: - Group verbal and visual instruction to review commonly prescribed medications for heart and lung disease. Reviews the medication, class of the drug, and side effects. Includes the steps to properly store meds and maintain the prescription regimen.  Written material given at graduation.   Other: -Provides group and verbal instruction on various topics (see comments)   Knowledge Questionnaire Score:  Knowledge Questionnaire Score - 01/26/24 0936        Knowledge Questionnaire Score   Pre Score 15/18              Core Components/Risk Factors/Patient Goals at Admission:  Personal Goals and Risk Factors at Admission - 01/18/24 1037       Core Components/Risk Factors/Patient Goals on Admission   Improve shortness of breath with ADL's Yes    Intervention Provide education, individualized exercise plan and daily activity instruction to help decrease symptoms of SOB with activities of daily living.    Expected Outcomes Short Term: Improve cardiorespiratory fitness to achieve a reduction of symptoms when performing ADLs;Long Term: Be able to perform more ADLs without symptoms or delay the onset of symptoms    Hypertension Yes   Medication to take if it gets too low or if it is too high   Intervention Provide education on lifestyle modifcations including regular physical activity/exercise, weight management, moderate sodium restriction and increased consumption of fresh fruit, vegetables, and low fat dairy, alcohol moderation, and smoking cessation.;Monitor prescription use compliance.    Expected Outcomes Short Term: Continued assessment and intervention until BP is < 140/69mm HG in hypertensive participants. < 130/68mm HG in hypertensive participants with diabetes, heart failure or chronic kidney disease.;Long Term: Maintenance of blood pressure at goal levels.    Lipids Yes    Intervention Provide education and support for participant on nutrition & aerobic/resistive exercise along with prescribed medications to achieve LDL 70mg , HDL >40mg .    Expected Outcomes Short Term: Participant states understanding of desired cholesterol values and is compliant with medications prescribed. Participant is following exercise prescription and nutrition guidelines.;Long Term: Cholesterol controlled with medications as prescribed, with individualized exercise RX and with personalized nutrition plan. Value goals: LDL < 70mg , HDL > 40 mg.              Education:Diabetes - Individual verbal and written instruction to review signs/symptoms of diabetes, desired ranges of glucose level fasting, after meals and with exercise. Acknowledge that pre and post exercise glucose checks will be done for 3 sessions at entry of program.   Know Your Numbers and Heart Failure: - Group verbal and visual instruction to discuss disease risk factors for cardiac and pulmonary disease and treatment options.  Reviews associated critical values for Overweight/Obesity, Hypertension, Cholesterol, and Diabetes.  Discusses basics of heart failure: signs/symptoms and treatments.  Introduces Heart Failure Zone chart for action plan for heart failure.  Written material given at graduation.   Core Components/Risk Factors/Patient Goals Review:    Core Components/Risk Factors/Patient Goals at Discharge (Final Review):    ITP Comments:  ITP Comments     Row Name 01/18/24 1055 01/19/24 1417 01/26/24 0927 02/08/24 1209 02/08/24 1248   ITP Comments Initial phone call completed. Diagnosis can be found in [facility] 3/4. EP Orientation scheduled for Thursday 3/20 at 9:30. Completed and gym orientation. Initial ITP created and sent for review to Dr. Faud Aleskerov, Medical Director. First full day of exercise!  Patient was oriented to gym and equipment including functions, settings, policies, and procedures.  Patient's individual exercise prescription and treatment plan were reviewed.  All starting workloads were established based on the results of the 6 minute walk test done at initial orientation visit.  The plan for exercise progression was also introduced and progression will  be customized based on patient's performance and goals. 30 Day review completed. Medical Director ITP review done, changes made as directed, and signed approval by Medical Director.    new to program Patient called to inform staff that he would like to be placed on a hold for one month due to a new  medication complications and poor health at this time. He is hopeful that he will be able to return after that time frame. He will call staff back in a few weeks to share is decision to return or not.    Row Name 03/07/24 1346           ITP Comments 30 Day review completed. Medical Director ITP review done, changes made as directed, and signed approval by Medical Director.                Comments:

## 2024-03-08 ENCOUNTER — Encounter: Payer: Self-pay | Admitting: Vascular Surgery

## 2024-03-08 ENCOUNTER — Other Ambulatory Visit: Payer: Self-pay

## 2024-03-08 ENCOUNTER — Ambulatory Visit
Admission: RE | Admit: 2024-03-08 | Discharge: 2024-03-08 | Disposition: A | Discharge status: Home / Self Care | Attending: Vascular Surgery | Admitting: Vascular Surgery

## 2024-03-08 ENCOUNTER — Other Ambulatory Visit
Admission: RE | Admit: 2024-03-08 | Discharge: 2024-03-08 | Disposition: A | Discharge status: Home / Self Care | Source: Ambulatory Visit | Attending: Infectious Diseases | Admitting: Infectious Diseases

## 2024-03-08 ENCOUNTER — Encounter
Admission: RE | Disposition: A | Discharge status: Home / Self Care | Payer: Self-pay | Source: Home / Self Care | Attending: Vascular Surgery

## 2024-03-08 ENCOUNTER — Encounter

## 2024-03-08 ENCOUNTER — Other Ambulatory Visit: Payer: Self-pay | Admitting: Infectious Diseases

## 2024-03-08 DIAGNOSIS — Z452 Encounter for adjustment and management of vascular access device: Secondary | ICD-10-CM | POA: Diagnosis present

## 2024-03-08 DIAGNOSIS — F419 Anxiety disorder, unspecified: Secondary | ICD-10-CM | POA: Diagnosis not present

## 2024-03-08 DIAGNOSIS — A310 Pulmonary mycobacterial infection: Secondary | ICD-10-CM

## 2024-03-08 DIAGNOSIS — C3490 Malignant neoplasm of unspecified part of unspecified bronchus or lung: Secondary | ICD-10-CM

## 2024-03-08 DIAGNOSIS — I110 Hypertensive heart disease with heart failure: Secondary | ICD-10-CM | POA: Insufficient documentation

## 2024-03-08 DIAGNOSIS — J4489 Other specified chronic obstructive pulmonary disease: Secondary | ICD-10-CM | POA: Diagnosis not present

## 2024-03-08 DIAGNOSIS — E039 Hypothyroidism, unspecified: Secondary | ICD-10-CM | POA: Diagnosis not present

## 2024-03-08 DIAGNOSIS — Z923 Personal history of irradiation: Secondary | ICD-10-CM | POA: Insufficient documentation

## 2024-03-08 DIAGNOSIS — C342 Malignant neoplasm of middle lobe, bronchus or lung: Secondary | ICD-10-CM | POA: Insufficient documentation

## 2024-03-08 DIAGNOSIS — I509 Heart failure, unspecified: Secondary | ICD-10-CM | POA: Insufficient documentation

## 2024-03-08 DIAGNOSIS — F17210 Nicotine dependence, cigarettes, uncomplicated: Secondary | ICD-10-CM | POA: Diagnosis not present

## 2024-03-08 DIAGNOSIS — J449 Chronic obstructive pulmonary disease, unspecified: Secondary | ICD-10-CM

## 2024-03-08 DIAGNOSIS — F32A Depression, unspecified: Secondary | ICD-10-CM | POA: Insufficient documentation

## 2024-03-08 DIAGNOSIS — Z79890 Hormone replacement therapy (postmenopausal): Secondary | ICD-10-CM | POA: Insufficient documentation

## 2024-03-08 LAB — MAC SUSCEPTIBILITY BROTH
Ciprofloxacin: >8
Clarithromycin: 4
Doxycycline: >8
Linezolid: 32
Minocycline: >8
Moxifloxacin: 4
Rifabutin: 0.25
Rifampin: 4
Streptomycin: 32

## 2024-03-08 LAB — EXPECTORATED SPUTUM ASSESSMENT W GRAM STAIN, RFLX TO RESP C

## 2024-03-08 LAB — ACID FAST ID BY PCR AND SUSCEPTIBILITIES
M Tuberculosis Complex: NEGATIVE
M avium complex: POSITIVE — AB

## 2024-03-08 LAB — ACID FAST SMEAR (AFB, MYCOBACTERIA): Acid Fast Smear: NEGATIVE

## 2024-03-08 LAB — ACID FAST CULTURE WITH REFLEXED SENSITIVITIES (MYCOBACTERIA): Acid Fast Culture: POSITIVE — AB

## 2024-03-08 SURGERY — PORTA CATH REMOVAL
Anesthesia: Moderate Sedation

## 2024-03-08 MED ORDER — ONDANSETRON HCL 4 MG/2ML IJ SOLN: 4.0000 mg | Freq: Four times a day (QID) | INTRAMUSCULAR | Status: DC | PRN

## 2024-03-08 MED ORDER — FENTANYL CITRATE (PF) 100 MCG/2ML IJ SOLN
INTRAMUSCULAR | Status: AC
  Filled 2024-03-08: qty 2

## 2024-03-08 MED ORDER — MIDAZOLAM HCL 2 MG/ML PO SYRP: 8.0000 mg | ORAL_SOLUTION | Freq: Once | ORAL | Status: DC | PRN

## 2024-03-08 MED ORDER — MIDAZOLAM HCL 2 MG/2ML IJ SOLN
INTRAMUSCULAR | Status: DC | PRN
  Administered 2024-03-08 (×2): 1 mg via INTRAVENOUS
  Administered 2024-03-08: 2 mg via INTRAVENOUS

## 2024-03-08 MED ORDER — MIDAZOLAM HCL 2 MG/2ML IJ SOLN
INTRAMUSCULAR | Status: AC
  Filled 2024-03-08: qty 2

## 2024-03-08 MED ORDER — SODIUM CHLORIDE 0.9 % IV SOLN
INTRAVENOUS | Status: DC
Start: 2024-03-08 — End: 2024-03-08

## 2024-03-08 MED ORDER — HYDROMORPHONE HCL 1 MG/ML IJ SOLN: 1.0000 mg | Freq: Once | INTRAMUSCULAR | Status: DC | PRN

## 2024-03-08 MED ORDER — FAMOTIDINE 20 MG PO TABS: 40.0000 mg | ORAL_TABLET | Freq: Once | ORAL | Status: DC | PRN

## 2024-03-08 MED ORDER — DIPHENHYDRAMINE HCL 50 MG/ML IJ SOLN: 50.0000 mg | Freq: Once | INTRAMUSCULAR | Status: DC | PRN

## 2024-03-08 MED ORDER — CEFAZOLIN SODIUM-DEXTROSE 2-4 GM/100ML-% IV SOLN
INTRAVENOUS | Status: AC
  Filled 2024-03-08: qty 100

## 2024-03-08 MED ORDER — CEFAZOLIN SODIUM-DEXTROSE 2-4 GM/100ML-% IV SOLN: 2.0000 g | INTRAVENOUS | Status: DC

## 2024-03-08 MED ORDER — LIDOCAINE-EPINEPHRINE (PF) 1 %-1:200000 IJ SOLN
INTRAMUSCULAR | Status: DC | PRN
  Administered 2024-03-08: 20 mL

## 2024-03-08 MED ORDER — METHYLPREDNISOLONE SODIUM SUCC 125 MG IJ SOLR: 125.0000 mg | Freq: Once | INTRAMUSCULAR | Status: DC | PRN

## 2024-03-08 MED ORDER — MIDAZOLAM HCL 2 MG/2ML IJ SOLN
INTRAMUSCULAR | Status: AC
Start: 2024-03-08 — End: ?
  Filled 2024-03-08: qty 2

## 2024-03-08 MED ORDER — FENTANYL CITRATE (PF) 100 MCG/2ML IJ SOLN
INTRAMUSCULAR | Status: DC | PRN
  Administered 2024-03-08: 50 ug via INTRAVENOUS
  Administered 2024-03-08 (×2): 25 ug via INTRAVENOUS

## 2024-03-08 SURGICAL SUPPLY — 5 items
DERMABOND ADVANCED .7 DNX12 (GAUZE/BANDAGES/DRESSINGS) IMPLANT
PACK ANGIOGRAPHY (CUSTOM PROCEDURE TRAY) IMPLANT
PENCIL ELECTRO HAND CTR (MISCELLANEOUS) IMPLANT
SUT MNCRL AB 4-0 PS2 18 (SUTURE) IMPLANT
SUT VIC AB 3-0 SH 27X BRD (SUTURE) IMPLANT

## 2024-03-08 NOTE — Progress Notes (Signed)
Sputum culture ordered.

## 2024-03-08 NOTE — Op Note (Signed)
 [facility]       Operative Note  Date: 03/08/2024  Preoperative diagnosis:  1. Lung cancer, no longer using port  Postoperative diagnosis:  Same as above  Procedures: #1. Removal of right jugular port a cath   Surgeon: Mikki Alexander, MD  Anesthesia: Local with moderate conscious sedation for 17 minutes using 4 mg of Versed  and 100 mcg of Fentanyl   Fluoroscopy time: none  Contrast used: 0  Estimated blood loss: Minimal  Indication for the procedure:  The patient is a 68 y.o. male who has been treated for lung cancer and no longer needs their Port-A-Cath. The patient desires to have this removed. Risks and benefits including need for potential replacement with recurrent disease were discussed and patient is agreeable to proceed.  Description of procedure: The patient was brought to the vascular and interventional radiology suite. Moderate conscious sedation was administered during a face to face encounter with the patient throughout the procedure with my supervision of the RN administering medicines and monitoring the patient's vital signs, pulse oximetry, telemetry and mental status throughout from the start of the procedure until the patient was taken to the recovery room.  The right neck chest and shoulder were sterilely prepped and draped, and a sterile surgical field was created. The area was then anesthetized with 1% lidocaine  copiously. The previous incision was reopened and electrocautery used to dissected down to the port and the catheter. These were dissected free and the catheter was gently removed from the vein in its entirety. The port was dissected out from the fibrous connective tissue and the Prolene sutures were removed. The port was then removed in its entirety including the catheter. The wound was then closed with a 3-0 Vicryl and a 4-0 Monocryl and Dermabond was placed as a dressing. The patient was then taken to  the recovery room in stable condition having tolerated the procedure well.  Complications: none  Condition: stable   Mikki Alexander, MD 03/08/2024 1:29 PM   This note was created with Dragon Medical transcription system. Any errors in dictation are purely unintentional.

## 2024-03-08 NOTE — Interval H&P Note (Signed)
 History and Physical Interval Note:  03/08/2024 12:19 PM  Paul Carpenter  has presented today for surgery, with the diagnosis of Porta Cath Removal    Non small cell lung Ca.  The various methods of treatment have been discussed with the patient and family. After consideration of risks, benefits and other options for treatment, the patient has consented to  Procedure(s): PORTA CATH REMOVAL (N/A) as a surgical intervention.  The patient's history has been reviewed, patient examined, no change in status, stable for surgery.  I have reviewed the patient's chart and labs.  Questions were answered to the patient's satisfaction.     Yaniyah Koors

## 2024-03-09 ENCOUNTER — Telehealth: Payer: Self-pay

## 2024-03-09 NOTE — Telephone Encounter (Signed)
 I have notified the pharmacy. They have filed it under Medicare B and it has a $0 copay.  Nothing further needed.

## 2024-03-09 NOTE — Telephone Encounter (Signed)
*  Pulm  Pharmacy Patient Advocate Encounter   Received notification from CoverMyMeds that prior authorization for Arformoterol  Tartrate 15MCG/2ML nebulizer solution  is required/requested.   Insurance verification completed.   The patient is insured through [facility] [address] .   Per test claim: PA required; PA submitted to above mentioned insurance via CoverMyMeds Key/confirmation #/EOC [id number] Status is pending

## 2024-03-09 NOTE — Telephone Encounter (Signed)
 Pharmacy Patient Advocate Encounter  Received notification from [facility] that Prior Authorization for Arformoterol  Tartrate has been DENIED.  Full denial letter will be uploaded to the media tab. See denial reason below.   Denied. ARFORMOTEROL  TARTRATE Nebu Soln is used in a nebulizer. A nebulizer is a piece of durable medical equipment (DME). Drugs used with DME in the home are covered under Medicare Part B. Our records show that you do not live in a long term care (LTC) facility. We cannot pay for drugs under Medicare Part D if they are covered under Medicare Part A or B. We did not decide whether ARFORMOTEROL  TARTRATE Nebu Soln is medically necessary. We made our decision only on the fact that we cannot pay for the drug under Medicare Part D. For more information, talk to your prescriber or call 1- 800-MEDICARE.

## 2024-03-09 NOTE — Telephone Encounter (Signed)
 The simplest thing to do here is to have the pharmacy file it under Medicare Part B.  The medication is medically necessary as documented in the patient's chart.

## 2024-03-11 LAB — CULTURE, RESPIRATORY W GRAM STAIN: Culture: NORMAL

## 2024-03-13 ENCOUNTER — Encounter: Attending: Pulmonary Disease

## 2024-03-13 DIAGNOSIS — J449 Chronic obstructive pulmonary disease, unspecified: Secondary | ICD-10-CM | POA: Insufficient documentation

## 2024-03-14 ENCOUNTER — Encounter: Payer: Self-pay | Admitting: *Deleted

## 2024-03-14 ENCOUNTER — Ambulatory Visit: Payer: Self-pay

## 2024-03-14 NOTE — Telephone Encounter (Signed)
Patient advised of lab results and verbalized understanding.  Paul Carpenter, CMA

## 2024-03-14 NOTE — Telephone Encounter (Signed)
-----   Message from Alica Inks sent at 03/13/2024  2:57 PM EDT ----- Can you let him know that sputum culture was no bacteria identified ( this was the regualr bacteria that I tested and not afb). thx ----- Message ----- From: Dannis Dy, Lab In [place] Sent: 03/08/2024  10:18 PM EDT To: Alica Inks, MD

## 2024-03-15 ENCOUNTER — Encounter

## 2024-03-20 ENCOUNTER — Encounter

## 2024-03-20 ENCOUNTER — Encounter (INDEPENDENT_AMBULATORY_CARE_PROVIDER_SITE_OTHER): Payer: Self-pay

## 2024-03-20 ENCOUNTER — Encounter: Payer: Self-pay | Admitting: Pulmonary Disease

## 2024-03-20 ENCOUNTER — Ambulatory Visit (INDEPENDENT_AMBULATORY_CARE_PROVIDER_SITE_OTHER): Admitting: Pulmonary Disease

## 2024-03-20 VITALS — BP 116/70 | HR 100 | Temp 97.8°F | Ht 72.0 in | Wt 161.8 lb

## 2024-03-20 DIAGNOSIS — A310 Pulmonary mycobacterial infection: Secondary | ICD-10-CM

## 2024-03-20 DIAGNOSIS — J449 Chronic obstructive pulmonary disease, unspecified: Secondary | ICD-10-CM

## 2024-03-20 DIAGNOSIS — G4736 Sleep related hypoventilation in conditions classified elsewhere: Secondary | ICD-10-CM

## 2024-03-20 DIAGNOSIS — I5020 Unspecified systolic (congestive) heart failure: Secondary | ICD-10-CM | POA: Diagnosis not present

## 2024-03-20 DIAGNOSIS — F17210 Nicotine dependence, cigarettes, uncomplicated: Secondary | ICD-10-CM

## 2024-03-20 DIAGNOSIS — J439 Emphysema, unspecified: Secondary | ICD-10-CM | POA: Diagnosis not present

## 2024-03-20 MED ORDER — PREDNISONE 20 MG PO TABS: 20.0000 mg | ORAL_TABLET | Freq: Every day | ORAL | 0 refills | Status: AC

## 2024-03-20 MED ORDER — YUPELRI 175 MCG/3ML IN SOLN: 175.0000 ug | Freq: Every day | RESPIRATORY_TRACT | 11 refills | Status: DC

## 2024-03-20 MED ORDER — ARFORMOTEROL TARTRATE 15 MCG/2ML IN NEBU: 15.0000 ug | INHALATION_SOLUTION | Freq: Two times a day (BID) | RESPIRATORY_TRACT | 11 refills | Status: DC

## 2024-03-20 MED ORDER — HYDROCOD POLI-CHLORPHE POLI ER 10-8 MG/5ML PO SUER: 5.0000 mL | Freq: Two times a day (BID) | ORAL | 0 refills | Status: DC | PRN

## 2024-03-20 MED ORDER — CEFDINIR 300 MG PO CAPS
300.0000 mg | ORAL_CAPSULE | Freq: Two times a day (BID) | ORAL | 0 refills | Status: DC
Start: 2024-03-20 — End: 2024-06-07

## 2024-03-20 NOTE — Progress Notes (Signed)
 Subjective:    Patient ID: Paul Carpenter, male    DOB: July 09, 1956, 68 y.o.   MRN: [id number]  Patient Care Team: Antonio Baumgarten, MD as PCP - General (Internal Medicine) Vonna Guardian Donald Frost, MD as Referring Physician (Vascular Surgery) Shellie Dials, MD as Consulting Physician (Oncology) Glenis Langdon, MD as Referring Physician (Radiation Oncology) Marc Senior, MD as Consulting Physician (Pulmonary Disease) Alica Inks, MD as Consulting Physician (Infectious Diseases) Erling Hawthorne, MD as Referring Physician (Pulmonary Disease)  Chief Complaint  Patient presents with   Follow-up    Cough, shortness of breath and wheezing.     BACKGROUND/INTERVAL: Paul Carpenter is a very complex 68 year old current smoker (1.5 PPD, 150 PY) with a history as noted below, who presents for follow-up of a cavitary right upper lobe process in the setting of history of non-small cell carcinoma of the lung, Mycobacterium avium complex infection, stage IV COPD and chronic dyspnea/fatigue.  This is a scheduled visit.  He was last seen 25 January 2024, he is also followed at [facility] at the pulmonary specialty clinic for management of his RESISTANT MAC.  He is up to 1.5 packs of cigarettes per day smoking.   HPI Discussed the use of AI scribe software for clinical note transcription with the patient, who gave verbal consent to proceed.  History of Present Illness   Paul Carpenter is a 68 year old male with COPD and MAC infection who presents with worsening respiratory symptoms.  He has experienced worsening respiratory symptoms, including difficulty breathing and persistent coughing. A severe episode occurred on Sunday night, during which he coughed up blood that transitioned from fresh red to a rust color, and he is now producing green sputum. His oxygen levels have been low, and he has been unable to stop coughing. No recent fever.  He has a history of COPD and is currently on medications  including Yupelri , budesonide  and Brovana . He mentions issues with pharmacy coverage under Medicare Part B, which have since been resolved. He continues to smoke, though he has reduced his intake to about a pack and a half a day from three packs previously.  He is undergoing treatment for a MAC infection, taking multiple antibiotics including clofazamine, though he feels there has been no significant improvement. Recent sputum samples remain positive for MAC.  He had a port removed due to vein irritation, with symptoms of stinging and burning extending from his ear down his arm. The swelling in the veins has not subsided, and he is unsure of the cause.  He is following with vascular surgery in this regard.  He has a history of lung cancer, previously treated with chemoradiation.  No evidence of recurrence as of his last CT of 14 February 2024.  He has used codeine  cough medicine in the past, which provided significant relief, especially during recent severe episodes.     Most recent PFTs were obtained on 08 November 2023 these show an FEV1 of 0.86 L or 23% predicted, FVC of 1.95 L or 39% predicted, FEV1/FVC of 44%, severe diffusion capacity impairment, patient was not able to perform lung volumes. Findings are consistent with very severe obstructive airways disease on the basis of emphysema.   Review of Systems A 10 point review of systems was performed and it is as noted above otherwise negative.   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   Port-A-Cath in place 01/17/2024   Mycobacterium avium-intracellulare infection (HCC)  11/10/2023   Chronic adrenal insufficiency (HCC) 11/09/2023   Acute on chronic hypoxic respiratory failure (HCC) 11/09/2023   COPD with acute exacerbation (HCC) 11/08/2023   Pulmonary cavitary lesion 11/08/2023   Chronic respiratory failure with hypoxia (HCC) 11/08/2023   Hyponatremia 09/11/2022   Generalized weakness 09/10/2022    Shortness of breath 02/03/2022   SIADH (syndrome of inappropriate ADH production) (HCC) 09/22/2021   Overweight (BMI 25.0-29.9) 09/22/2021   Hypomagnesemia 09/21/2021   Elevated hemoglobin A1c 06/19/2020   Hypothyroidism, acquired 05/30/2019   Squamous cell lung cancer, right (HCC) 05/30/2019   HFrEF (heart failure with reduced ejection fraction) (HCC) 03/06/2019   Atherosclerosis 12/14/2018   Non-small cell lung cancer, right (HCC) 04/24/2018   Oral ulcer 02/16/2018   Pituitary disorder (HCC) 02/16/2018   Migraine headache 03/07/2017   HCAP (healthcare-associated pneumonia) 09/11/2016   COPD exacerbation (HCC) 09/11/2016   Chronic hyponatremia 09/11/2016   Leukocytosis 09/11/2016   Thrombocytopenia (HCC) 09/11/2016   Cigarette smoker 06/11/2016   Cervical radiculopathy 04/15/2016   Cervical disc disorder at C5-C6 level with radiculopathy 03/09/2016   Impingement syndrome of right shoulder 03/09/2016   Health care maintenance 09/29/2015   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   Benign essential hypertension 05/28/2015   Polycythemia 03/24/2015   Carotid artery disease (HCC) 12/12/2014   Disequilibrium 12/12/2014   Mixed hyperlipidemia 12/10/2014   Incomplete emptying of bladder 06/04/2014   Anxiety 05/18/2014   Chronic coronary artery disease 05/18/2014   Chronic headaches 05/18/2014   Acute shoulder pain 03/15/2014   Impingement syndrome of left shoulder 03/15/2014   Lung mass 12/06/2013   Kidney stone 11/24/2013   COPD (chronic obstructive pulmonary disease) (HCC) 04/24/2013   Tobacco use disorder 04/24/2013   Obstructive sleep apnea 04/24/2013   Benign localized prostatic hyperplasia with lower urinary tract symptoms (LUTS) 07/04/2012   Encounter for long-term current use of medication 07/04/2012   ED (erectile dysfunction) of organic origin 07/04/2012   Testicular hypofunction 07/04/2012    Social History   Tobacco Use   Smoking status: Every Day    Current packs/day: 1.50     Average packs/day: 3.0 packs/day for 52.4 years (156.1 ttl pk-yrs)    Types: Cigarettes    Start date: 1973    Passive exposure: Current   Smokeless tobacco: Never  Substance Use Topics   Alcohol use: Not Currently    Alcohol/week: 2.0 standard drinks of alcohol    Types: 2 Standard drinks or equivalent per week    Comment: moderate    Allergies  Allergen Reactions   Lisinopril Rash   Varenicline  Rash    Current Meds  Medication Sig   acetaminophen  (TYLENOL ) 500 MG tablet Take 500 mg by mouth daily as needed.   albuterol  (PROVENTIL ) (2.5 MG/3ML) 0.083% nebulizer solution Take 3 mLs (2.5 mg total) by nebulization 4 (four) times daily as needed for wheezing or shortness of breath.   albuterol  (VENTOLIN  HFA) 108 (90 Base) MCG/ACT inhaler INHALE 2 PUFFS BY MOUTH EVERY 4 HOURS AS NEEDED FOR WHEEZE OR FOR SHORTNESS OF BREATH   amikacin  (AMIKIN ) 1 GM/4ML SOLN injection Inject into the muscle daily.   ammonium lactate (LAC-HYDRIN) 12 % lotion Apply topically.   atorvastatin  (LIPITOR) 10 MG tablet Take 10 mg by mouth daily.   azithromycin  (ZITHROMAX ) 500 MG tablet TAKE 1 TABLET (500 MG TOTAL) BY MOUTH DAILY.   budesonide  (PULMICORT ) 0.5 MG/2ML nebulizer solution Take 2 mLs (0.5 mg total) by nebulization 2 (two) times daily.   cefdinir  (OMNICEF ) 300 MG capsule Take  1 capsule (300 mg total) by mouth 2 (two) times daily.   chlorpheniramine-HYDROcodone  (TUSSIONEX) 10-8 MG/5ML Take 5 mLs by mouth every 12 (twelve) hours as needed for cough.   clofazimine  50 mg CAPS capsule (for compassionate use) Take 100 mg by mouth daily with breakfast.   clotrimazole  (MYCELEX ) 10 MG troche Take 10 mg by mouth 3 (three) times daily.   cyanocobalamin (VITAMIN B12) 1000 MCG/ML injection Inject 1 mL (1,000 mcg total) into the muscle monthly for 180 days   diazepam  (VALIUM ) 5 MG tablet Take 5 mg by mouth at bedtime as needed.   ethambutol  (MYAMBUTOL ) 400 MG tablet Take 3 tablets (1,200 mg total) by mouth daily.    ferrous sulfate  325 (65 FE) MG tablet Take 325 mg by mouth at bedtime.   Fluocinolone Acetonide 0.01 % OIL PLEASE SEE ATTACHED FOR DETAILED DIRECTIONS   folic acid  (FOLVITE ) 1 MG tablet Take 1 mg by mouth daily.   guaiFENesin  (MUCINEX ) 600 MG 12 hr tablet Take 1 tablet (600 mg total) by mouth 2 (two) times daily.   hydrocortisone  (CORTEF ) 10 MG tablet Take 2 tablets in the morning and 1 tablet mid afternoon (2 to 4 PM)   levothyroxine  (SYNTHROID ) 175 MCG tablet Take 175 mcg by mouth daily before breakfast.   lidocaine -prilocaine  (EMLA ) cream APPLY 1 APPLICATION TOPICALLY AS NEEDED.   magnesium  oxide (MAG-OX) 400 MG tablet Take 800 mg by mouth 2 (two) times daily.   melatonin 3 MG TABS tablet Take 3 mg by mouth at bedtime as needed.   metoprolol  succinate (TOPROL -XL) 25 MG 24 hr tablet Take 12.5-25 mg by mouth daily as needed (Acute High Blood Pressure).   naproxen  sodium (ANAPROX  DS) 550 MG tablet Take 1 tablet (550 mg total) by mouth 2 (two) times daily as needed for moderate pain (pain score 4-6). Take with meals   nicotine  (NICODERM CQ  - DOSED IN MG/24 HOURS) 21 mg/24hr patch Place 21 mg onto the skin daily.   nitroGLYCERIN  (NITROSTAT ) 0.4 MG SL tablet Place under the tongue.   OXYGEN Inhale 2 L into the lungs at bedtime.   pantoprazole  (PROTONIX ) 40 MG tablet Take 1 tablet (40 mg total) by mouth 2 (two) times daily before meals   [EXPIRED] predniSONE  (DELTASONE ) 20 MG tablet Take 1 tablet (20 mg total) by mouth daily with breakfast for 5 days.   promethazine -dextromethorphan  (PROMETHAZINE -DM) 6.25-15 MG/5ML syrup Take 5 mLs by mouth 4 (four) times daily as needed for cough.   sodium chloride  1 g tablet Take 5 tablets (5 g total) by mouth 3 (three) times daily with meals.   tadalafil  (CIALIS ) 5 MG tablet Take 1 tablet (5 mg total) by mouth daily as needed for erectile dysfunction.   [DISCONTINUED] arformoterol  (BROVANA ) 15 MCG/2ML NEBU TAKE 2 MLS (15 MCG TOTAL) BY NEBULIZATION 2 (TWO) TIMES  DAILY. DX: J44.9   [DISCONTINUED] YUPELRI  175 MCG/3ML nebulizer solution TAKE 3 MLS (175 MCG TOTAL) BY NEBULIZATION DAILY. DX: J44.9    Immunization History  Administered Date(s) Administered   Fluad Quad(high Dose 65+) 09/22/2020   Influenza Inj Mdck Quad Pf 09/06/2016, 10/02/2019, 09/09/2021, 09/03/2022   Influenza Split 08/05/2013, 07/23/2014, 08/18/2015   Influenza Whole 08/01/2012   Influenza,inj,Quad PF,6+ Mos 08/14/2018   Influenza-Unspecified 09/06/2016, 08/17/2017, 09/09/2021   Zoster Recombinant(Shingrix) 06/19/2020        Objective:     BP 116/70 (BP Location: Left Arm, Patient Position: Sitting, Cuff Size: Normal)   Pulse 100   Temp 97.8 F (36.6 C) (Temporal)  Ht 6' (1.829 m)   Wt 161 lb 12.8 oz (73.4 kg)   SpO2 96%   BMI 21.94 kg/m   SpO2: 96 %  GENERAL: Chronically ill-appearing man, mild tachypnea.  No conversational dyspnea.  Awake and alert.   HEAD: Normocephalic, atraumatic.  EYES: Pupils equal, round, reactive to light.  No scleral icterus.  MOUTH: Oral mucosa moist.  No thrush. NECK: Supple. No thyromegaly. Trachea midline. No JVD.  No adenopathy. PULMONARY: Good air entry bilaterally. Amphoric sounds on the right upper lung zone.  Coarse, without rhonchi or wheezes noted today.   CARDIOVASCULAR: S1 and S2. Regular rate and rhythm.  No rubs, murmurs or gallops heard. ABDOMEN: Benign. MUSCULOSKELETAL: No joint deformity, no clubbing, no edema.  NEUROLOGIC: No overt focal deficit. Speech is fluent. SKIN: Intact,warm,dry. PSYCH: Depressed mood and flat affect.  Irascible at times.      Assessment & Plan:     ICD-10-CM   1. Mycobacterium avium complex (HCC) - resistant  A31.0     2. Stage 4 very severe COPD by GOLD classification (HCC)  J44.9     3. Nocturnal hypoxemia due to emphysema (HCC)  J43.9    G47.36     4. HFrEF (heart failure with reduced ejection fraction) (HCC)  I50.20     5. Tobacco dependence due to cigarettes  F17.210       Meds ordered this encounter  Medications   cefdinir  (OMNICEF ) 300 MG capsule    Sig: Take 1 capsule (300 mg total) by mouth 2 (two) times daily.    Dispense:  14 capsule    Refill:  0   predniSONE  (DELTASONE ) 20 MG tablet    Sig: Take 1 tablet (20 mg total) by mouth daily with breakfast for 5 days.    Dispense:  5 tablet    Refill:  0   revefenacin  (YUPELRI ) 175 MCG/3ML nebulizer solution    Sig: Take 3 mLs (175 mcg total) by nebulization daily. Dx: J44.9    Dispense:  90 mL    Refill:  11    DX Code Needed   J44.9 File under Medicare Part B   arformoterol  (BROVANA ) 15 MCG/2ML NEBU    Sig: Take 2 mLs (15 mcg total) by nebulization 2 (two) times daily. Dx: J44.9    Dispense:  120 mL    Refill:  11    DX Code Needed   J44.9 File under Medicare Part B   chlorpheniramine-HYDROcodone  (TUSSIONEX) 10-8 MG/5ML    Sig: Take 5 mLs by mouth every 12 (twelve) hours as needed for cough.    Dispense:  115 mL    Refill:  0   Discussion:    Chronic obstructive pulmonary disease (COPD) COPD with recent exacerbation characterized by increased coughing, wheezing, and dyspnea. He reports a reduction in smoking from three packs to approximately one pack per day. Persistent smoking continues to contribute to respiratory issues. - Prescribe prednisone  for wheezing and inflammation. - Refill Yupelri  and Bravana inhalers.  Acute bronchitis Suspected acute bronchitis superimposed on chronic MAC infection, possibly due to Klebsiella, contributing to recent exacerbation of respiratory symptoms. - Prescribe an antibiotic to address acute bronchitis.  Mycobacterium avium complex (MAC) infection Chronic MAC infection with persistent positive sputum cultures. Current treatment includes antibiotics and clofazamine, which may be slowing the progression of the infection as it is now only seen on culture and not on stain. Continued therapy is necessary to prevent rampant progression. - Continue current  antibiotic regimen including clofazamine.  Hemoptysis Recent episode of hemoptysis with fresh red blood transitioning to rust-colored sputum. Possible bronchitis on top of MAC infection, potentially caused by Klebsiella, which can cause current jelly-like sputum. Hemoptysis may also be due to ruptured blood vessels from severe coughing. - Prescribe an antibiotic to target potential Klebsiella infection. - Prescribe codeine  cough medicine for severe coughing episodes, with caution due to regulatory concerns.  Lung cancer Lung cancer with no current active issues discussed in this encounter.  Follow-up Monitor response to treatment and adjust management as needed. - Schedule follow-up appointment in 4 to 6 weeks.     Advised if symptoms do not improve or worsen, to please contact office for sooner follow up or seek emergency care.    I spent 40 minutes of dedicated to the care of this patient on the date of this encounter to include pre-visit review of records, face-to-face time with the patient discussing conditions above, post visit ordering of testing, clinical documentation with the electronic health record, making appropriate referrals as documented, and communicating necessary findings to members of the patients care team.     C. Chloe Counter, MD Advanced Bronchoscopy [facility]    *This note was generated using voice recognition software/Dragon and/or AI transcription program.  Despite best efforts to proofread, errors can occur which can change the meaning. Any transcriptional errors that result from this process are unintentional and may not be fully corrected at the time of dictation.

## 2024-03-20 NOTE — Patient Instructions (Signed)
 VISIT SUMMARY:  During today's visit, we discussed your worsening respiratory symptoms, including difficulty breathing, persistent coughing, and coughing up blood. We reviewed your history of COPD, MAC infection, and lung cancer, and addressed your current medications and recent issues with pharmacy coverage. We also discussed your smoking habits and recent reduction in smoking.  YOUR PLAN:  -CHRONIC OBSTRUCTIVE PULMONARY DISEASE (COPD): COPD is a chronic lung disease that makes it hard to breathe. To help with your recent exacerbation, we are prescribing prednisone  to reduce inflammation and refilling your Yupelri  and Bravana inhalers.  -ACUTE BRONCHITIS: Acute bronchitis is an infection of the airways that can cause coughing and mucus production. We suspect this is contributing to your recent symptoms, so we are prescribing an antibiotic to treat it.  -MYCOBACTERIUM AVIUM COMPLEX (MAC) INFECTION: MAC infection is a chronic lung infection that requires long-term antibiotic treatment. Your current treatment seems to be slowing the infection, so we will continue with the same antibiotics, including clofazamine.  -HEMOPTYSIS: Hemoptysis is coughing up blood, which can be caused by severe coughing or infections. We are prescribing an antibiotic to target a potential Klebsiella infection and codeine  cough medicine to help manage severe coughing episodes.  -LUNG CANCER: You have a history of lung cancer, but there were no active issues discussed during this visit.  INSTRUCTIONS:  Please schedule a follow-up appointment in 4 to 6 weeks to monitor your response to the treatment and adjust your management plan as needed.

## 2024-03-21 ENCOUNTER — Telehealth: Payer: Self-pay | Admitting: *Deleted

## 2024-03-21 NOTE — Telephone Encounter (Signed)
 Left message for Mr. Kinkead to call staff back to see if he is able to return to [facility] or if he would like to be discharged.

## 2024-03-22 ENCOUNTER — Encounter

## 2024-03-23 ENCOUNTER — Telehealth (INDEPENDENT_AMBULATORY_CARE_PROVIDER_SITE_OTHER): Payer: Self-pay

## 2024-03-23 NOTE — Telephone Encounter (Signed)
 We have him come in and discuss with JD given the underlying issues

## 2024-03-23 NOTE — Telephone Encounter (Signed)
 Pt's wife Corbin Dess called concerning pt having issues with the area of port removal from about 3 weeks ago.  States that the pt is worse if anything nothing is better.  The area is still stinging and painful.  Pulmonologist recommended that pt see Dr Vonna Guardian for US  when he was seen there this week.  Corbin Dess would like to schedule an appt.  Please advise

## 2024-03-25 ENCOUNTER — Other Ambulatory Visit: Payer: Self-pay | Admitting: Pulmonary Disease

## 2024-03-27 ENCOUNTER — Encounter

## 2024-03-29 ENCOUNTER — Encounter

## 2024-03-30 ENCOUNTER — Encounter (INDEPENDENT_AMBULATORY_CARE_PROVIDER_SITE_OTHER): Payer: Self-pay | Admitting: Vascular Surgery

## 2024-03-30 ENCOUNTER — Ambulatory Visit (INDEPENDENT_AMBULATORY_CARE_PROVIDER_SITE_OTHER): Admitting: Vascular Surgery

## 2024-03-30 VITALS — BP 138/85 | HR 99 | Resp 20 | Ht 72.0 in | Wt 161.0 lb

## 2024-03-30 DIAGNOSIS — E782 Mixed hyperlipidemia: Secondary | ICD-10-CM

## 2024-03-30 DIAGNOSIS — I10 Essential (primary) hypertension: Secondary | ICD-10-CM | POA: Diagnosis not present

## 2024-03-30 DIAGNOSIS — C3491 Malignant neoplasm of unspecified part of right bronchus or lung: Secondary | ICD-10-CM

## 2024-03-30 DIAGNOSIS — I871 Compression of vein: Secondary | ICD-10-CM

## 2024-03-30 NOTE — H&P (View-Only) (Signed)
 MRN : [id number]  Paul Carpenter is a 68 y.o. (Mar 17, 1956) male who presents with chief complaint of  Chief Complaint  Patient presents with   Follow-up    Check port removal incision  .  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  He had his port removed several weeks ago but says he is still bothered by prominent varicosities which are painful.  He has a sharp stinging sensation.  He does have some neuropathic type pain in his hands and feet and has a history of chemotherapy.  There are some swelling in the shoulder and neck area.  His port removal site is well-healed.  No fevers or chills.  Current Outpatient Medications  Medication Sig Dispense Refill   acetaminophen  (TYLENOL ) 500 MG tablet Take 500 mg by mouth daily as needed.     albuterol  (PROVENTIL ) (2.5 MG/3ML) 0.083% nebulizer solution Take 3 mLs (2.5 mg total) by nebulization 4 (four) times daily as needed for wheezing or shortness of breath. 75 mL 11   albuterol  (VENTOLIN  HFA) 108 (90 Base) MCG/ACT inhaler INHALE 2 PUFFS BY MOUTH EVERY 4 HOURS AS NEEDED FOR WHEEZE OR FOR SHORTNESS OF BREATH 6.7 each 2   amikacin  (AMIKIN ) 1 GM/4ML SOLN injection Inject into the muscle daily.     ammonium lactate (LAC-HYDRIN) 12 % lotion Apply topically.     arformoterol  (BROVANA ) 15 MCG/2ML NEBU Take 2 mLs (15 mcg total) by nebulization 2 (two) times daily. Dx: J44.9 120 mL 11   atorvastatin  (LIPITOR) 10 MG tablet Take 10 mg by mouth daily.     azithromycin  (ZITHROMAX ) 500 MG tablet TAKE 1 TABLET (500 MG TOTAL) BY MOUTH DAILY. 30 tablet 6   budesonide  (PULMICORT ) 0.5 MG/2ML nebulizer solution Take 2 mLs (0.5 mg total) by nebulization 2 (two) times daily. 360 mL 3   cefdinir (OMNICEF) 300 MG capsule Take 1 capsule (300 mg total) by mouth 2 (two) times daily. 14 capsule 0   chlorpheniramine-HYDROcodone  (TUSSIONEX) 10-8 MG/5ML Take 5 mLs by mouth every 12 (twelve) hours as needed for cough. 115  mL 0   clofazimine  50 mg CAPS capsule (for compassionate use) Take 100 mg by mouth daily with breakfast.     clotrimazole  (MYCELEX ) 10 MG troche Take 10 mg by mouth 3 (three) times daily.     cyanocobalamin (VITAMIN B12) 1000 MCG/ML injection Inject 1 mL (1,000 mcg total) into the muscle monthly for 180 days     diazepam  (VALIUM ) 5 MG tablet Take 5 mg by mouth at bedtime as needed.     ethambutol  (MYAMBUTOL ) 400 MG tablet Take 3 tablets (1,200 mg total) by mouth daily. 90 tablet 6   ferrous sulfate  325 (65 FE) MG tablet Take 325 mg by mouth at bedtime.     Fluocinolone Acetonide 0.01 % OIL PLEASE SEE ATTACHED FOR DETAILED DIRECTIONS     folic acid  (FOLVITE ) 1 MG tablet Take 1 mg by mouth daily.     guaiFENesin  (MUCINEX ) 600 MG 12 hr tablet Take 1 tablet (600 mg total) by mouth 2 (two) times daily. 60 tablet 0   hydrocortisone  (CORTEF ) 10 MG tablet Take 2 tablets in the morning and 1 tablet mid afternoon (2 to 4 PM) 90 tablet 0   levothyroxine  (SYNTHROID ) 175 MCG tablet Take 175 mcg by mouth daily before breakfast.     lidocaine -prilocaine  (EMLA ) cream APPLY 1 APPLICATION TOPICALLY AS NEEDED. 30 g 1   magnesium  oxide (MAG-OX) 400  MG tablet Take 800 mg by mouth 2 (two) times daily.     melatonin 3 MG TABS tablet Take 3 mg by mouth at bedtime as needed.     metoprolol  succinate (TOPROL -XL) 25 MG 24 hr tablet Take 12.5-25 mg by mouth daily as needed (Acute High Blood Pressure).     naproxen  sodium (ANAPROX  DS) 550 MG tablet Take 1 tablet (550 mg total) by mouth 2 (two) times daily as needed for moderate pain (pain score 4-6). Take with meals 30 tablet 1   nicotine  (NICODERM CQ  - DOSED IN MG/24 HOURS) 21 mg/24hr patch Place 21 mg onto the skin daily.     nitroGLYCERIN  (NITROSTAT ) 0.4 MG SL tablet Place under the tongue.     OXYGEN Inhale 2 L into the lungs at bedtime.     pantoprazole  (PROTONIX ) 40 MG tablet Take 1 tablet (40 mg total) by mouth 2 (two) times daily before meals      promethazine -dextromethorphan  (PROMETHAZINE -DM) 6.25-15 MG/5ML syrup Take 5 mLs by mouth 4 (four) times daily as needed for cough. 180 mL 1   revefenacin  (YUPELRI ) 175 MCG/3ML nebulizer solution Take 3 mLs (175 mcg total) by nebulization daily. Dx: J44.9 90 mL 11   sodium chloride  1 g tablet Take 5 tablets (5 g total) by mouth 3 (three) times daily with meals.     tadalafil  (CIALIS ) 5 MG tablet Take 1 tablet (5 mg total) by mouth daily as needed for erectile dysfunction. 90 tablet 3   No current facility-administered medications for this visit.   Facility-Administered Medications Ordered in Other Visits  Medication Dose Route Frequency Provider Last Rate Last Admin   heparin  lock flush 100 unit/mL  500 Units Intravenous Once Shellie Dials, MD        Past Medical History:  Diagnosis Date   Anginal pain (HCC)    Anxiety    Asthma    Chest pain    CHF (congestive heart failure) (HCC)    Chicken pox    Complication of anesthesia    o2 dropped after neck fusion   COPD (chronic obstructive pulmonary disease) (HCC)    Coronary artery disease    Cough    chronic  clear phlegm   Dysrhythmia    palpitations   GERD (gastroesophageal reflux disease)    h/o reflux/ hoarsness   Hematochezia    Hemorrhoids    History of chickenpox    History of colon polyps    History of Helicobacter pylori infection    Hoarseness    Hypertension    Lung cancer (HCC) 05/2016   Chemo + rad tx's.    Migraines    OSA (obstructive sleep apnea)    has CPAP but does not use   Personal history of tobacco use, presenting hazards to health 03/05/2016   Pneumonia    5/17   Raynaud disease    Raynaud disease    Raynaud's disease    Rotator cuff tear    on right   Shortness of breath dyspnea    Sleep apnea    Ulcer (traumatic) of oral mucosa     Past Surgical History:  Procedure Laterality Date   BACK SURGERY     cervical fusion x 2   CARDIAC CATHETERIZATION     CERVICAL DISCECTOMY     x 2    COLONOSCOPY     COLONOSCOPY N/A 07/25/2015   Procedure: COLONOSCOPY;  Surgeon: Deveron Fly, MD;  Location: [facility] ENDOSCOPY;  Service: Endoscopy;  Laterality: N/A;   COLONOSCOPY WITH PROPOFOL  N/A 10/04/2017   Procedure: COLONOSCOPY WITH PROPOFOL ;  Surgeon: Deveron Fly, MD;  Location: [facility] ENDOSCOPY;  Service: Endoscopy;  Laterality: N/A;   COLONOSCOPY WITH PROPOFOL  N/A 07/10/2020   Procedure: COLONOSCOPY WITH PROPOFOL ;  Surgeon: Toledo, Alphonsus Jeans, MD;  Location: [facility] ENDOSCOPY;  Service: Gastroenterology;  Laterality: N/A;   ELECTROMAGNETIC NAVIGATION BROCHOSCOPY Left 06/28/2016   Procedure: ELECTROMAGNETIC NAVIGATION BRONCHOSCOPY;  Surgeon: Laine Piggs, MD;  Location: [facility] ORS;  Service: Cardiopulmonary;  Laterality: Left;   ENDOBRONCHIAL ULTRASOUND N/A 04/11/2018   Procedure: ENDOBRONCHIAL ULTRASOUND;  Surgeon: Cleve Dale, MD;  Location: [facility] ORS;  Service: Cardiopulmonary;  Laterality: N/A;   ESOPHAGOGASTRODUODENOSCOPY N/A 07/25/2015   Procedure: ESOPHAGOGASTRODUODENOSCOPY (EGD);  Surgeon: Deveron Fly, MD;  Location: [facility] ENDOSCOPY;  Service: Endoscopy;  Laterality: N/A;   ESOPHAGOGASTRODUODENOSCOPY (EGD) WITH PROPOFOL  N/A 07/10/2020   Procedure: ESOPHAGOGASTRODUODENOSCOPY (EGD) WITH PROPOFOL ;  Surgeon: Toledo, Alphonsus Jeans, MD;  Location: [facility] ENDOSCOPY;  Service: Gastroenterology;  Laterality: N/A;   NASAL SINUS SURGERY     x 2    PORTA CATH INSERTION N/A 04/24/2018   Procedure: PORTA CATH INSERTION;  Surgeon: Celso College, MD;  Location: [facility] INVASIVE CV LAB;  Service: Cardiovascular;  Laterality: N/A;   PORTA CATH REMOVAL N/A 03/08/2024   Procedure: PORTA CATH REMOVAL;  Surgeon: Celso College, MD;  Location: [facility] INVASIVE CV LAB;  Service: Cardiovascular;  Laterality: N/A;   rotator cuff surgery Right    07/2016   SEPTOPLASTY     SKIN GRAFT       Social History   Tobacco Use   Smoking status: Every Day    Current packs/day: 1.50    Average packs/day: 3.0 packs/day for  52.4 years (156.1 ttl pk-yrs)    Types: Cigarettes    Start date: 1973    Passive exposure: Current   Smokeless tobacco: Never  Vaping Use   Vaping status: Never Used  Substance Use Topics   Alcohol use: Not Currently    Alcohol/week: 2.0 standard drinks of alcohol    Types: 2 Standard drinks or equivalent per week    Comment: moderate   Drug use: No      Family History  Problem Relation Age of Onset   Heart disease Father    Prostate cancer Father    Heart disease Paternal Grandmother    Heart attack Maternal Grandfather 24   Kidney cancer Neg Hx    Bladder Cancer Neg Hx    Other Neg Hx        pituitary abnormality     Allergies  Allergen Reactions   Lisinopril Rash   Varenicline  Rash     REVIEW OF SYSTEMS (Negative unless checked)  Constitutional: [] Weight loss  [] Fever  [] Chills Cardiac: [] Chest pain   [] Chest pressure   [] Palpitations   [] Shortness of breath when laying flat   [] Shortness of breath at rest   [x] Shortness of breath with exertion. Vascular:  [] Pain in legs with walking   [] Pain in legs at rest   [] Pain in legs when laying flat   [] Claudication   [] Pain in feet when walking  [] Pain in feet at rest  [] Pain in feet when laying flat   [] History of DVT   [] Phlebitis   [] Swelling in legs   [x] Varicose veins   [] Non-healing ulcers Pulmonary:   [] Uses home oxygen   [] Productive cough   [] Hemoptysis   [] Wheeze  [] COPD   [] Asthma Neurologic:  [] Dizziness  [] Blackouts   []   Seizures   [] History of stroke   [] History of TIA  [] Aphasia   [] Temporary blindness   [] Dysphagia   [] Weakness or numbness in arms   [x] Weakness or numbness in legs Musculoskeletal:  [] Arthritis   [] Joint swelling   [x] Joint pain   [] Low back pain Hematologic:  [] Easy bruising  [] Easy bleeding   [] Hypercoagulable state   [] Anemic   Gastrointestinal:  [] Blood in stool   [] Vomiting blood  [] Gastroesophageal reflux/heartburn   [] Abdominal pain Genitourinary:  [] Chronic kidney disease    [] Difficult urination  [] Frequent urination  [] Burning with urination   [] Hematuria Skin:  [] Rashes   [] Ulcers   [] Wounds Psychological:  [] History of anxiety   []  History of major depression.  Physical Examination  BP 138/85   Pulse 99   Resp 20   Ht 6' (1.829 m)   Wt 161 lb (73 kg)   BMI 21.84 kg/m  Gen:  WD/WN, NAD Head: [place]/AT, No temporalis wasting. Ear/Nose/Throat: Hearing grossly intact, nares w/o erythema or drainage Eyes: Conjunctiva clear. Sclera non-icteric Neck: Supple.  Trachea midline Pulmonary:  Good air movement, no use of accessory muscles.  Cardiac: RRR, no JVD Vascular: Prominent varicosities are present in the right shoulder, neck, and upper arm area. Vessel Right Left  Radial Palpable Palpable               Musculoskeletal: M/S 5/5 throughout.  No deformity or atrophy.  Trace lower extremity edema. Neurologic: Sensation grossly intact in extremities.  Symmetrical.  Speech is fluent.  Psychiatric: Judgment intact, Mood & affect appropriate for pt's clinical situation. Dermatologic: No rashes or ulcers noted.  No cellulitis or open wounds.      Labs Recent Results (from the past 2160 hours)  Basic metabolic panel     Status: Abnormal   Collection Time: 01/16/24  9:27 AM  Result Value Ref Range   Sodium 137 135 - 145 mmol/L   Potassium 3.7 3.5 - 5.1 mmol/L   Chloride 99 98 - 111 mmol/L   CO2 30 22 - 32 mmol/L   Glucose, Bld 88 70 - 99 mg/dL    Comment: Glucose reference range applies only to samples taken after fasting for at least 8 hours.   BUN 6 (L) 8 - 23 mg/dL   Creatinine, Ser 1.47 0.61 - 1.24 mg/dL   Calcium  8.7 (L) 8.9 - 10.3 mg/dL   GFR, Estimated >82 >95 mL/min    Comment: (NOTE) Calculated using the CKD-EPI Creatinine Equation (2021)    Anion gap 8 5 - 15    Comment: Performed at [facility], [address] [place], [place] [address]  Acid Fast Smear (AFB)     Status: None   Collection Time: 02/02/24  8:45 AM    Specimen: Sputum  Result Value Ref Range   AFB Specimen Processing Concentration    Acid Fast Smear Negative     Comment: (NOTE) Performed At: [facility] [address] [place], [place] [id number] Pearlean Botts MD [id number]    Source (AFB) SPU     Comment: Performed at [facility], [address] [place], [place] [address]  Acid Fast Culture with reflexed sensitivities     Status: Abnormal   Collection Time: 02/02/24  8:45 AM   Specimen: Sputum  Result Value Ref Range   Acid Fast Culture Positive (A)     Comment: (NOTE) Acid-fast bacilli have been detected in culture at 2 weeks. Further identification to follow. Performed At: [facility] [address] [place], [place] [id number] Pearlean Botts MD ZD:[id number]    Source of Sample SPU     Comment: Performed at [facility], [address] [place], [place] [address]  Acid Fast ID by PCR and Susceptibilities     Status: Abnormal   Collection Time: 02/02/24  8:45 AM  Result Value Ref Range   M Tuberculosis Complex Negative     Comment: (NOTE) This test was developed and its performance characteristics determined by [facility]. It has not been cleared or approved by the Food and Drug Administration.    M avium complex Positive (A)     Comment: (NOTE) This test was developed and its performance characteristics determined by [facility]. It has not been cleared or approved by the Food and Drug Administration.    Reflex ID by MALDI Not Indicated    Other: NAIDN     Comment: No additional identification testing is necessary.   Susceptibility Testing See reflex.     Comment: (NOTE) Performed At: [facility] [address] [place], [place] [id number] Pearlean Botts MD [id number]   MAC Susceptibility Broth     Status: Abnormal   Collection Time: 02/02/24  8:45 AM  Result Value Ref Range   Organism ID Comment (A)     Comment: Mycobacterium avium complex   Amikacin  Comment      Comment: (NOTE) 16.0 ug/mL Susceptible Breakpoints have been established for only some organism-drug combinations as indicated. This test was developed and its performance characteristics determined by [facility]. It has not been cleared or approved by the Food and Drug Administration. For Mycobacterium avium-intracellulare complex, the interpretive criteria for amikacin  apply to IV treatment. If using amikacin  (liposomal, inhaled), the MIC interpretive breakpoints are <= 64ug/mL Susceptible, >= 128 ug/mL Resistant (CLSI M24S).    Ciprofloxacin  >8.0 ug/mL     Comment: (NOTE) Breakpoints have been established for only some organism-drug combinations as indicated. This test was developed and its performance characteristics determined by [facility]. It has not been cleared or approved by the Food and Drug Administration.    Clarithromycin 4.0 ug/mL Susceptible     Comment: (NOTE) Breakpoints have been established for only some organism-drug combinations as indicated. This test was developed and its performance characteristics determined by [facility]. It has not been cleared or approved by the Food and Drug Administration.    Clofazimine  NOT PERFORMED     Comment: (NOTE) Test not performed Breakpoints have been established for only some organism-drug combinations as indicated. This test was developed and its performance characteristics determined by [facility]. It has not been cleared or approved by the Food and Drug Administration.    Doxycycline  >8.0 ug/mL     Comment: (NOTE) Breakpoints have been established for only some organism-drug combinations as indicated. This test was developed and its performance characteristics determined by [facility]. It has not been cleared or approved by the Food and Drug Administration.    Linezolid 32.0 ug/mL Resistant     Comment: (NOTE) Breakpoints have been established for only some organism-drug combinations as indicated. This test was  developed and its performance characteristics determined by [facility]. It has not been cleared or approved by the Food and Drug Administration.    Minocycline >8.0 ug/mL     Comment: (NOTE) Breakpoints have been established for only some organism-drug combinations as indicated. This test was developed and its performance characteristics determined by [facility]. It has not been cleared or approved by the Food and Drug Administration.    Moxifloxacin 4.0 ug/mL Resistant  Comment: (NOTE) Breakpoints have been established for only some organism-drug combinations as indicated. This test was developed and its performance characteristics determined by [facility]. It has not been cleared or approved by the Food and Drug Administration.    Rifabutin 0.25 ug/mL     Comment: (NOTE) Breakpoints have been established for only some organism-drug combinations as indicated. This test was developed and its performance characteristics determined by [facility]. It has not been cleared or approved by the Food and Drug Administration.    Rifampin  4.0 ug/mL     Comment: (NOTE) Breakpoints have been established for only some organism-drug combinations as indicated. This test was developed and its performance characteristics determined by [facility]. It has not been cleared or approved by the Food and Drug Administration.    Streptomycin 32.0 ug/mL     Comment: (NOTE) Breakpoints have been established for only some organism-drug combinations as indicated. This test was developed and its performance characteristics determined by [facility]. It has not been cleared or approved by the Food and Drug Administration.    Trimethoprim/Sulfa 4/76 ug/mL     Comment: (NOTE) Breakpoints have been established for only some organism-drug combinations as indicated. This test was developed and its performance characteristics determined by [facility]. It has not been cleared or approved by the Food and Drug  Administration. Performed At: [facility] [address] [place], [place] [id number] Pearlean Botts MD YQ:[id number]   Expectorated Sputum Assessment w Gram Stain, Rflx to Resp Cult     Status: None   Collection Time: 03/08/24 11:00 AM   Specimen: Sputum  Result Value Ref Range   Specimen Description SPU    Special Requests NONE    Sputum evaluation      THIS SPECIMEN IS ACCEPTABLE FOR SPUTUM CULTURE Performed at [facility], [address], [place], [place] [address]    Report Status 03/08/2024 FINAL   Culture, Respiratory w Gram Stain     Status: None   Collection Time: 03/08/24 11:00 AM   Specimen: Sputum  Result Value Ref Range   Specimen Description      SPU Performed at [facility], [address] [place], [place] [address]    Special Requests      NONE Reflexed from [id number] Performed at [facility], [address] [place], [place] [address]    Gram Stain      ABUNDANT WBC PRESENT, PREDOMINANTLY PMN NO ORGANISMS SEEN    Culture      Normal respiratory flora-no Staph aureus or Pseudomonas seen Performed at [facility], [address], [place], [place] [address]    Report Status 03/11/2024 FINAL     Radiology PERIPHERAL VASCULAR CATHETERIZATION Result Date: 03/08/2024 See surgical note for result.   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  He has had his port removed but still has persistent symptoms.  At this point, since he remains symptomatic, I think it is reasonable to proceed with a venogram with possible intervention.  Risks and benefits were discussed with the patient and he is agreeable to proceed.  Mixed hyperlipidemia lipid control important in reducing the progression of atherosclerotic disease. Continue statin therapy     Non-small cell lung cancer, right (HCC) Follows with oncology.  This was the  reason the port was placed initially.   Benign essential hypertension blood pressure control important in reducing the progression of atherosclerotic disease. On appropriate oral  medications.  Mikki Alexander, MD  03/30/2024 1:55 PM    This note was created with Dragon medical transcription system.  Any errors from dictation are purely unintentional

## 2024-03-30 NOTE — Assessment & Plan Note (Signed)
 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  He has had his port removed but still has persistent symptoms.  At this point, since he remains symptomatic, I think it is reasonable to proceed with a venogram with possible intervention.  Risks and benefits were discussed with the patient and he is agreeable to proceed.

## 2024-03-30 NOTE — Progress Notes (Signed)
 MRN : [id number]  Paul Carpenter is a 68 y.o. (Mar 17, 1956) male who presents with chief complaint of  Chief Complaint  Patient presents with   Follow-up    Check port removal incision  .  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  He had his port removed several weeks ago but says he is still bothered by prominent varicosities which are painful.  He has a sharp stinging sensation.  He does have some neuropathic type pain in his hands and feet and has a history of chemotherapy.  There are some swelling in the shoulder and neck area.  His port removal site is well-healed.  No fevers or chills.  Current Outpatient Medications  Medication Sig Dispense Refill   acetaminophen  (TYLENOL ) 500 MG tablet Take 500 mg by mouth daily as needed.     albuterol  (PROVENTIL ) (2.5 MG/3ML) 0.083% nebulizer solution Take 3 mLs (2.5 mg total) by nebulization 4 (four) times daily as needed for wheezing or shortness of breath. 75 mL 11   albuterol  (VENTOLIN  HFA) 108 (90 Base) MCG/ACT inhaler INHALE 2 PUFFS BY MOUTH EVERY 4 HOURS AS NEEDED FOR WHEEZE OR FOR SHORTNESS OF BREATH 6.7 each 2   amikacin  (AMIKIN ) 1 GM/4ML SOLN injection Inject into the muscle daily.     ammonium lactate (LAC-HYDRIN) 12 % lotion Apply topically.     arformoterol  (BROVANA ) 15 MCG/2ML NEBU Take 2 mLs (15 mcg total) by nebulization 2 (two) times daily. Dx: J44.9 120 mL 11   atorvastatin  (LIPITOR) 10 MG tablet Take 10 mg by mouth daily.     azithromycin  (ZITHROMAX ) 500 MG tablet TAKE 1 TABLET (500 MG TOTAL) BY MOUTH DAILY. 30 tablet 6   budesonide  (PULMICORT ) 0.5 MG/2ML nebulizer solution Take 2 mLs (0.5 mg total) by nebulization 2 (two) times daily. 360 mL 3   cefdinir (OMNICEF) 300 MG capsule Take 1 capsule (300 mg total) by mouth 2 (two) times daily. 14 capsule 0   chlorpheniramine-HYDROcodone  (TUSSIONEX) 10-8 MG/5ML Take 5 mLs by mouth every 12 (twelve) hours as needed for cough. 115  mL 0   clofazimine  50 mg CAPS capsule (for compassionate use) Take 100 mg by mouth daily with breakfast.     clotrimazole  (MYCELEX ) 10 MG troche Take 10 mg by mouth 3 (three) times daily.     cyanocobalamin (VITAMIN B12) 1000 MCG/ML injection Inject 1 mL (1,000 mcg total) into the muscle monthly for 180 days     diazepam  (VALIUM ) 5 MG tablet Take 5 mg by mouth at bedtime as needed.     ethambutol  (MYAMBUTOL ) 400 MG tablet Take 3 tablets (1,200 mg total) by mouth daily. 90 tablet 6   ferrous sulfate  325 (65 FE) MG tablet Take 325 mg by mouth at bedtime.     Fluocinolone Acetonide 0.01 % OIL PLEASE SEE ATTACHED FOR DETAILED DIRECTIONS     folic acid  (FOLVITE ) 1 MG tablet Take 1 mg by mouth daily.     guaiFENesin  (MUCINEX ) 600 MG 12 hr tablet Take 1 tablet (600 mg total) by mouth 2 (two) times daily. 60 tablet 0   hydrocortisone  (CORTEF ) 10 MG tablet Take 2 tablets in the morning and 1 tablet mid afternoon (2 to 4 PM) 90 tablet 0   levothyroxine  (SYNTHROID ) 175 MCG tablet Take 175 mcg by mouth daily before breakfast.     lidocaine -prilocaine  (EMLA ) cream APPLY 1 APPLICATION TOPICALLY AS NEEDED. 30 g 1   magnesium  oxide (MAG-OX) 400  MG tablet Take 800 mg by mouth 2 (two) times daily.     melatonin 3 MG TABS tablet Take 3 mg by mouth at bedtime as needed.     metoprolol  succinate (TOPROL -XL) 25 MG 24 hr tablet Take 12.5-25 mg by mouth daily as needed (Acute High Blood Pressure).     naproxen  sodium (ANAPROX  DS) 550 MG tablet Take 1 tablet (550 mg total) by mouth 2 (two) times daily as needed for moderate pain (pain score 4-6). Take with meals 30 tablet 1   nicotine  (NICODERM CQ  - DOSED IN MG/24 HOURS) 21 mg/24hr patch Place 21 mg onto the skin daily.     nitroGLYCERIN  (NITROSTAT ) 0.4 MG SL tablet Place under the tongue.     OXYGEN Inhale 2 L into the lungs at bedtime.     pantoprazole  (PROTONIX ) 40 MG tablet Take 1 tablet (40 mg total) by mouth 2 (two) times daily before meals      promethazine -dextromethorphan  (PROMETHAZINE -DM) 6.25-15 MG/5ML syrup Take 5 mLs by mouth 4 (four) times daily as needed for cough. 180 mL 1   revefenacin  (YUPELRI ) 175 MCG/3ML nebulizer solution Take 3 mLs (175 mcg total) by nebulization daily. Dx: J44.9 90 mL 11   sodium chloride  1 g tablet Take 5 tablets (5 g total) by mouth 3 (three) times daily with meals.     tadalafil  (CIALIS ) 5 MG tablet Take 1 tablet (5 mg total) by mouth daily as needed for erectile dysfunction. 90 tablet 3   No current facility-administered medications for this visit.   Facility-Administered Medications Ordered in Other Visits  Medication Dose Route Frequency Provider Last Rate Last Admin   heparin  lock flush 100 unit/mL  500 Units Intravenous Once Shellie Dials, MD        Past Medical History:  Diagnosis Date   Anginal pain (HCC)    Anxiety    Asthma    Chest pain    CHF (congestive heart failure) (HCC)    Chicken pox    Complication of anesthesia    o2 dropped after neck fusion   COPD (chronic obstructive pulmonary disease) (HCC)    Coronary artery disease    Cough    chronic  clear phlegm   Dysrhythmia    palpitations   GERD (gastroesophageal reflux disease)    h/o reflux/ hoarsness   Hematochezia    Hemorrhoids    History of chickenpox    History of colon polyps    History of Helicobacter pylori infection    Hoarseness    Hypertension    Lung cancer (HCC) 05/2016   Chemo + rad tx's.    Migraines    OSA (obstructive sleep apnea)    has CPAP but does not use   Personal history of tobacco use, presenting hazards to health 03/05/2016   Pneumonia    5/17   Raynaud disease    Raynaud disease    Raynaud's disease    Rotator cuff tear    on right   Shortness of breath dyspnea    Sleep apnea    Ulcer (traumatic) of oral mucosa     Past Surgical History:  Procedure Laterality Date   BACK SURGERY     cervical fusion x 2   CARDIAC CATHETERIZATION     CERVICAL DISCECTOMY     x 2    COLONOSCOPY     COLONOSCOPY N/A 07/25/2015   Procedure: COLONOSCOPY;  Surgeon: Deveron Fly, MD;  Location: [facility] ENDOSCOPY;  Service: Endoscopy;  Laterality: N/A;   COLONOSCOPY WITH PROPOFOL  N/A 10/04/2017   Procedure: COLONOSCOPY WITH PROPOFOL ;  Surgeon: Deveron Fly, MD;  Location: [facility] ENDOSCOPY;  Service: Endoscopy;  Laterality: N/A;   COLONOSCOPY WITH PROPOFOL  N/A 07/10/2020   Procedure: COLONOSCOPY WITH PROPOFOL ;  Surgeon: Toledo, Alphonsus Jeans, MD;  Location: [facility] ENDOSCOPY;  Service: Gastroenterology;  Laterality: N/A;   ELECTROMAGNETIC NAVIGATION BROCHOSCOPY Left 06/28/2016   Procedure: ELECTROMAGNETIC NAVIGATION BRONCHOSCOPY;  Surgeon: Laine Piggs, MD;  Location: [facility] ORS;  Service: Cardiopulmonary;  Laterality: Left;   ENDOBRONCHIAL ULTRASOUND N/A 04/11/2018   Procedure: ENDOBRONCHIAL ULTRASOUND;  Surgeon: Cleve Dale, MD;  Location: [facility] ORS;  Service: Cardiopulmonary;  Laterality: N/A;   ESOPHAGOGASTRODUODENOSCOPY N/A 07/25/2015   Procedure: ESOPHAGOGASTRODUODENOSCOPY (EGD);  Surgeon: Deveron Fly, MD;  Location: [facility] ENDOSCOPY;  Service: Endoscopy;  Laterality: N/A;   ESOPHAGOGASTRODUODENOSCOPY (EGD) WITH PROPOFOL  N/A 07/10/2020   Procedure: ESOPHAGOGASTRODUODENOSCOPY (EGD) WITH PROPOFOL ;  Surgeon: Toledo, Alphonsus Jeans, MD;  Location: [facility] ENDOSCOPY;  Service: Gastroenterology;  Laterality: N/A;   NASAL SINUS SURGERY     x 2    PORTA CATH INSERTION N/A 04/24/2018   Procedure: PORTA CATH INSERTION;  Surgeon: Celso College, MD;  Location: [facility] INVASIVE CV LAB;  Service: Cardiovascular;  Laterality: N/A;   PORTA CATH REMOVAL N/A 03/08/2024   Procedure: PORTA CATH REMOVAL;  Surgeon: Celso College, MD;  Location: [facility] INVASIVE CV LAB;  Service: Cardiovascular;  Laterality: N/A;   rotator cuff surgery Right    07/2016   SEPTOPLASTY     SKIN GRAFT       Social History   Tobacco Use   Smoking status: Every Day    Current packs/day: 1.50    Average packs/day: 3.0 packs/day for  52.4 years (156.1 ttl pk-yrs)    Types: Cigarettes    Start date: 1973    Passive exposure: Current   Smokeless tobacco: Never  Vaping Use   Vaping status: Never Used  Substance Use Topics   Alcohol use: Not Currently    Alcohol/week: 2.0 standard drinks of alcohol    Types: 2 Standard drinks or equivalent per week    Comment: moderate   Drug use: No      Family History  Problem Relation Age of Onset   Heart disease Father    Prostate cancer Father    Heart disease Paternal Grandmother    Heart attack Maternal Grandfather 24   Kidney cancer Neg Hx    Bladder Cancer Neg Hx    Other Neg Hx        pituitary abnormality     Allergies  Allergen Reactions   Lisinopril Rash   Varenicline  Rash     REVIEW OF SYSTEMS (Negative unless checked)  Constitutional: [] Weight loss  [] Fever  [] Chills Cardiac: [] Chest pain   [] Chest pressure   [] Palpitations   [] Shortness of breath when laying flat   [] Shortness of breath at rest   [x] Shortness of breath with exertion. Vascular:  [] Pain in legs with walking   [] Pain in legs at rest   [] Pain in legs when laying flat   [] Claudication   [] Pain in feet when walking  [] Pain in feet at rest  [] Pain in feet when laying flat   [] History of DVT   [] Phlebitis   [] Swelling in legs   [x] Varicose veins   [] Non-healing ulcers Pulmonary:   [] Uses home oxygen   [] Productive cough   [] Hemoptysis   [] Wheeze  [] COPD   [] Asthma Neurologic:  [] Dizziness  [] Blackouts   []   Seizures   [] History of stroke   [] History of TIA  [] Aphasia   [] Temporary blindness   [] Dysphagia   [] Weakness or numbness in arms   [x] Weakness or numbness in legs Musculoskeletal:  [] Arthritis   [] Joint swelling   [x] Joint pain   [] Low back pain Hematologic:  [] Easy bruising  [] Easy bleeding   [] Hypercoagulable state   [] Anemic   Gastrointestinal:  [] Blood in stool   [] Vomiting blood  [] Gastroesophageal reflux/heartburn   [] Abdominal pain Genitourinary:  [] Chronic kidney disease    [] Difficult urination  [] Frequent urination  [] Burning with urination   [] Hematuria Skin:  [] Rashes   [] Ulcers   [] Wounds Psychological:  [] History of anxiety   []  History of major depression.  Physical Examination  BP 138/85   Pulse 99   Resp 20   Ht 6' (1.829 m)   Wt 161 lb (73 kg)   BMI 21.84 kg/m  Gen:  WD/WN, NAD Head: [place]/AT, No temporalis wasting. Ear/Nose/Throat: Hearing grossly intact, nares w/o erythema or drainage Eyes: Conjunctiva clear. Sclera non-icteric Neck: Supple.  Trachea midline Pulmonary:  Good air movement, no use of accessory muscles.  Cardiac: RRR, no JVD Vascular: Prominent varicosities are present in the right shoulder, neck, and upper arm area. Vessel Right Left  Radial Palpable Palpable               Musculoskeletal: M/S 5/5 throughout.  No deformity or atrophy.  Trace lower extremity edema. Neurologic: Sensation grossly intact in extremities.  Symmetrical.  Speech is fluent.  Psychiatric: Judgment intact, Mood & affect appropriate for pt's clinical situation. Dermatologic: No rashes or ulcers noted.  No cellulitis or open wounds.      Labs Recent Results (from the past 2160 hours)  Basic metabolic panel     Status: Abnormal   Collection Time: 01/16/24  9:27 AM  Result Value Ref Range   Sodium 137 135 - 145 mmol/L   Potassium 3.7 3.5 - 5.1 mmol/L   Chloride 99 98 - 111 mmol/L   CO2 30 22 - 32 mmol/L   Glucose, Bld 88 70 - 99 mg/dL    Comment: Glucose reference range applies only to samples taken after fasting for at least 8 hours.   BUN 6 (L) 8 - 23 mg/dL   Creatinine, Ser 1.47 0.61 - 1.24 mg/dL   Calcium  8.7 (L) 8.9 - 10.3 mg/dL   GFR, Estimated >82 >95 mL/min    Comment: (NOTE) Calculated using the CKD-EPI Creatinine Equation (2021)    Anion gap 8 5 - 15    Comment: Performed at [facility], [address] [place], [place] [address]  Acid Fast Smear (AFB)     Status: None   Collection Time: 02/02/24  8:45 AM    Specimen: Sputum  Result Value Ref Range   AFB Specimen Processing Concentration    Acid Fast Smear Negative     Comment: (NOTE) Performed At: [facility] [address] [place], [place] [id number] Pearlean Botts MD [id number]    Source (AFB) SPU     Comment: Performed at [facility], [address] [place], [place] [address]  Acid Fast Culture with reflexed sensitivities     Status: Abnormal   Collection Time: 02/02/24  8:45 AM   Specimen: Sputum  Result Value Ref Range   Acid Fast Culture Positive (A)     Comment: (NOTE) Acid-fast bacilli have been detected in culture at 2 weeks. Further identification to follow. Performed At: [facility] [address] [place], [place] [id number] Pearlean Botts MD ZD:[id number]    Source of Sample SPU     Comment: Performed at [facility], [address] [place], [place] [address]  Acid Fast ID by PCR and Susceptibilities     Status: Abnormal   Collection Time: 02/02/24  8:45 AM  Result Value Ref Range   M Tuberculosis Complex Negative     Comment: (NOTE) This test was developed and its performance characteristics determined by [facility]. It has not been cleared or approved by the Food and Drug Administration.    M avium complex Positive (A)     Comment: (NOTE) This test was developed and its performance characteristics determined by [facility]. It has not been cleared or approved by the Food and Drug Administration.    Reflex ID by MALDI Not Indicated    Other: NAIDN     Comment: No additional identification testing is necessary.   Susceptibility Testing See reflex.     Comment: (NOTE) Performed At: [facility] [address] [place], [place] [id number] Pearlean Botts MD [id number]   MAC Susceptibility Broth     Status: Abnormal   Collection Time: 02/02/24  8:45 AM  Result Value Ref Range   Organism ID Comment (A)     Comment: Mycobacterium avium complex   Amikacin  Comment      Comment: (NOTE) 16.0 ug/mL Susceptible Breakpoints have been established for only some organism-drug combinations as indicated. This test was developed and its performance characteristics determined by [facility]. It has not been cleared or approved by the Food and Drug Administration. For Mycobacterium avium-intracellulare complex, the interpretive criteria for amikacin  apply to IV treatment. If using amikacin  (liposomal, inhaled), the MIC interpretive breakpoints are <= 64ug/mL Susceptible, >= 128 ug/mL Resistant (CLSI M24S).    Ciprofloxacin  >8.0 ug/mL     Comment: (NOTE) Breakpoints have been established for only some organism-drug combinations as indicated. This test was developed and its performance characteristics determined by [facility]. It has not been cleared or approved by the Food and Drug Administration.    Clarithromycin 4.0 ug/mL Susceptible     Comment: (NOTE) Breakpoints have been established for only some organism-drug combinations as indicated. This test was developed and its performance characteristics determined by [facility]. It has not been cleared or approved by the Food and Drug Administration.    Clofazimine  NOT PERFORMED     Comment: (NOTE) Test not performed Breakpoints have been established for only some organism-drug combinations as indicated. This test was developed and its performance characteristics determined by [facility]. It has not been cleared or approved by the Food and Drug Administration.    Doxycycline  >8.0 ug/mL     Comment: (NOTE) Breakpoints have been established for only some organism-drug combinations as indicated. This test was developed and its performance characteristics determined by [facility]. It has not been cleared or approved by the Food and Drug Administration.    Linezolid 32.0 ug/mL Resistant     Comment: (NOTE) Breakpoints have been established for only some organism-drug combinations as indicated. This test was  developed and its performance characteristics determined by [facility]. It has not been cleared or approved by the Food and Drug Administration.    Minocycline >8.0 ug/mL     Comment: (NOTE) Breakpoints have been established for only some organism-drug combinations as indicated. This test was developed and its performance characteristics determined by [facility]. It has not been cleared or approved by the Food and Drug Administration.    Moxifloxacin 4.0 ug/mL Resistant  Comment: (NOTE) Breakpoints have been established for only some organism-drug combinations as indicated. This test was developed and its performance characteristics determined by [facility]. It has not been cleared or approved by the Food and Drug Administration.    Rifabutin 0.25 ug/mL     Comment: (NOTE) Breakpoints have been established for only some organism-drug combinations as indicated. This test was developed and its performance characteristics determined by [facility]. It has not been cleared or approved by the Food and Drug Administration.    Rifampin  4.0 ug/mL     Comment: (NOTE) Breakpoints have been established for only some organism-drug combinations as indicated. This test was developed and its performance characteristics determined by [facility]. It has not been cleared or approved by the Food and Drug Administration.    Streptomycin 32.0 ug/mL     Comment: (NOTE) Breakpoints have been established for only some organism-drug combinations as indicated. This test was developed and its performance characteristics determined by [facility]. It has not been cleared or approved by the Food and Drug Administration.    Trimethoprim/Sulfa 4/76 ug/mL     Comment: (NOTE) Breakpoints have been established for only some organism-drug combinations as indicated. This test was developed and its performance characteristics determined by [facility]. It has not been cleared or approved by the Food and Drug  Administration. Performed At: [facility] [address] [place], [place] [id number] Pearlean Botts MD YQ:[id number]   Expectorated Sputum Assessment w Gram Stain, Rflx to Resp Cult     Status: None   Collection Time: 03/08/24 11:00 AM   Specimen: Sputum  Result Value Ref Range   Specimen Description SPU    Special Requests NONE    Sputum evaluation      THIS SPECIMEN IS ACCEPTABLE FOR SPUTUM CULTURE Performed at [facility], [address], [place], [place] [address]    Report Status 03/08/2024 FINAL   Culture, Respiratory w Gram Stain     Status: None   Collection Time: 03/08/24 11:00 AM   Specimen: Sputum  Result Value Ref Range   Specimen Description      SPU Performed at [facility], [address] [place], [place] [address]    Special Requests      NONE Reflexed from [id number] Performed at [facility], [address] [place], [place] [address]    Gram Stain      ABUNDANT WBC PRESENT, PREDOMINANTLY PMN NO ORGANISMS SEEN    Culture      Normal respiratory flora-no Staph aureus or Pseudomonas seen Performed at [facility], [address], [place], [place] [address]    Report Status 03/11/2024 FINAL     Radiology PERIPHERAL VASCULAR CATHETERIZATION Result Date: 03/08/2024 See surgical note for result.   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  He has had his port removed but still has persistent symptoms.  At this point, since he remains symptomatic, I think it is reasonable to proceed with a venogram with possible intervention.  Risks and benefits were discussed with the patient and he is agreeable to proceed.  Mixed hyperlipidemia lipid control important in reducing the progression of atherosclerotic disease. Continue statin therapy     Non-small cell lung cancer, right (HCC) Follows with oncology.  This was the  reason the port was placed initially.   Benign essential hypertension blood pressure control important in reducing the progression of atherosclerotic disease. On appropriate oral  medications.  Paul Alexander, MD  03/30/2024 1:55 PM    This note was created with Dragon medical transcription system.  Any errors from dictation are purely unintentional

## 2024-04-03 ENCOUNTER — Other Ambulatory Visit: Payer: Self-pay

## 2024-04-03 ENCOUNTER — Other Ambulatory Visit: Payer: Self-pay | Admitting: Infectious Diseases

## 2024-04-03 ENCOUNTER — Encounter: Attending: Pulmonary Disease

## 2024-04-03 DIAGNOSIS — J449 Chronic obstructive pulmonary disease, unspecified: Secondary | ICD-10-CM | POA: Insufficient documentation

## 2024-04-04 ENCOUNTER — Other Ambulatory Visit
Admission: RE | Admit: 2024-04-04 | Discharge: 2024-04-04 | Disposition: A | Discharge status: Home / Self Care | Source: Ambulatory Visit | Attending: Pulmonary Disease | Admitting: Pulmonary Disease

## 2024-04-04 ENCOUNTER — Encounter: Payer: Self-pay | Admitting: *Deleted

## 2024-04-04 DIAGNOSIS — A310 Pulmonary mycobacterial infection: Secondary | ICD-10-CM | POA: Diagnosis present

## 2024-04-04 DIAGNOSIS — J449 Chronic obstructive pulmonary disease, unspecified: Secondary | ICD-10-CM

## 2024-04-04 NOTE — Progress Notes (Signed)
 Pulmonary Individual Treatment Plan  Patient Details  Name: Paul Carpenter MRN: [id number] Date of Birth: 12-21-1955 Referring Provider:   Flowsheet Row Pulmonary Rehab from 01/19/2024 in [facility] Cardiac and Pulmonary Rehab  Referring Provider Dr. Merlene Stark       Initial Encounter Date:  Flowsheet Row Pulmonary Rehab from 01/19/2024 in [facility] Cardiac and Pulmonary Rehab  Date 01/19/24       Visit Diagnosis: Chronic obstructive pulmonary disease, unspecified COPD type (HCC)  Patient's Home Medications on Admission:  Current Outpatient Medications:    acetaminophen  (TYLENOL ) 500 MG tablet, Take 500 mg by mouth daily as needed., Disp: , Rfl:    albuterol  (PROVENTIL ) (2.5 MG/3ML) 0.083% nebulizer solution, Take 3 mLs (2.5 mg total) by nebulization 4 (four) times daily as needed for wheezing or shortness of breath., Disp: 75 mL, Rfl: 11   albuterol  (VENTOLIN  HFA) 108 (90 Base) MCG/ACT inhaler, INHALE 2 PUFFS BY MOUTH EVERY 4 HOURS AS NEEDED FOR WHEEZE OR FOR SHORTNESS OF BREATH, Disp: 6.7 each, Rfl: 2   amikacin  (AMIKIN ) 1 GM/4ML SOLN injection, Inject into the muscle daily., Disp: , Rfl:    ammonium lactate (LAC-HYDRIN) 12 % lotion, Apply topically., Disp: , Rfl:    arformoterol  (BROVANA ) 15 MCG/2ML NEBU, Take 2 mLs (15 mcg total) by nebulization 2 (two) times daily. Dx: J44.9, Disp: 120 mL, Rfl: 11   atorvastatin  (LIPITOR) 10 MG tablet, Take 10 mg by mouth daily., Disp: , Rfl:    azithromycin  (ZITHROMAX ) 500 MG tablet, TAKE 1 TABLET (500 MG TOTAL) BY MOUTH DAILY., Disp: 30 tablet, Rfl: 6   budesonide  (PULMICORT ) 0.5 MG/2ML nebulizer solution, Take 2 mLs (0.5 mg total) by nebulization 2 (two) times daily., Disp: 360 mL, Rfl: 3   cefdinir  (OMNICEF ) 300 MG capsule, Take 1 capsule (300 mg total) by mouth 2 (two) times daily., Disp: 14 capsule, Rfl: 0   chlorpheniramine-HYDROcodone  (TUSSIONEX) 10-8 MG/5ML, Take 5 mLs by mouth every 12 (twelve) hours as needed for cough., Disp: 115 mL, Rfl:  0   clofazimine  50 mg CAPS capsule (for compassionate use), Take 100 mg by mouth daily with breakfast., Disp: , Rfl:    clotrimazole  (MYCELEX ) 10 MG troche, Take 10 mg by mouth 3 (three) times daily., Disp: , Rfl:    cyanocobalamin (VITAMIN B12) 1000 MCG/ML injection, Inject 1 mL (1,000 mcg total) into the muscle monthly for 180 days, Disp: , Rfl:    diazepam  (VALIUM ) 5 MG tablet, Take 5 mg by mouth at bedtime as needed., Disp: , Rfl:    ethambutol  (MYAMBUTOL ) 400 MG tablet, Take 3 tablets (1,200 mg total) by mouth daily., Disp: 90 tablet, Rfl: 6   ferrous sulfate  325 (65 FE) MG tablet, Take 325 mg by mouth at bedtime., Disp: , Rfl:    Fluocinolone Acetonide 0.01 % OIL, PLEASE SEE ATTACHED FOR DETAILED DIRECTIONS, Disp: , Rfl:    folic acid  (FOLVITE ) 1 MG tablet, Take 1 mg by mouth daily., Disp: , Rfl:    guaiFENesin  (MUCINEX ) 600 MG 12 hr tablet, Take 1 tablet (600 mg total) by mouth 2 (two) times daily., Disp: 60 tablet, Rfl: 0   hydrocortisone  (CORTEF ) 10 MG tablet, Take 2 tablets in the morning and 1 tablet mid afternoon (2 to 4 PM), Disp: 90 tablet, Rfl: 0   levothyroxine  (SYNTHROID ) 175 MCG tablet, Take 175 mcg by mouth daily before breakfast., Disp: , Rfl:    lidocaine -prilocaine  (EMLA ) cream, APPLY 1 APPLICATION TOPICALLY AS NEEDED., Disp: 30 g, Rfl: 1   magnesium   oxide (MAG-OX) 400 MG tablet, Take 800 mg by mouth 2 (two) times daily., Disp: , Rfl:    melatonin 3 MG TABS tablet, Take 3 mg by mouth at bedtime as needed., Disp: , Rfl:    metoprolol  succinate (TOPROL -XL) 25 MG 24 hr tablet, Take 12.5-25 mg by mouth daily as needed (Acute High Blood Pressure)., Disp: , Rfl:    naproxen  sodium (ANAPROX  DS) 550 MG tablet, Take 1 tablet (550 mg total) by mouth 2 (two) times daily as needed for moderate pain (pain score 4-6). Take with meals, Disp: 30 tablet, Rfl: 1   nicotine  (NICODERM CQ  - DOSED IN MG/24 HOURS) 21 mg/24hr patch, Place 21 mg onto the skin daily., Disp: , Rfl:    nitroGLYCERIN   (NITROSTAT ) 0.4 MG SL tablet, Place under the tongue., Disp: , Rfl:    OXYGEN, Inhale 2 L into the lungs at bedtime., Disp: , Rfl:    pantoprazole  (PROTONIX ) 40 MG tablet, Take 1 tablet (40 mg total) by mouth 2 (two) times daily before meals, Disp: , Rfl:    promethazine -dextromethorphan  (PROMETHAZINE -DM) 6.25-15 MG/5ML syrup, Take 5 mLs by mouth 4 (four) times daily as needed for cough., Disp: 180 mL, Rfl: 1   revefenacin  (YUPELRI ) 175 MCG/3ML nebulizer solution, Take 3 mLs (175 mcg total) by nebulization daily. Dx: J44.9, Disp: 90 mL, Rfl: 11   sodium chloride  1 g tablet, Take 5 tablets (5 g total) by mouth 3 (three) times daily with meals., Disp: , Rfl:    tadalafil  (CIALIS ) 5 MG tablet, Take 1 tablet (5 mg total) by mouth daily as needed for erectile dysfunction., Disp: 90 tablet, Rfl: 3 No current facility-administered medications for this visit.  Facility-Administered Medications Ordered in Other Visits:    heparin  lock flush 100 unit/mL, 500 Units, Intravenous, Once, [name], Deadra Everts, MD  Past Medical History: Past Medical History:  Diagnosis Date   Anginal pain (HCC)    Anxiety    Asthma    Chest pain    CHF (congestive heart failure) (HCC)    Chicken pox    Complication of anesthesia    o2 dropped after neck fusion   COPD (chronic obstructive pulmonary disease) (HCC)    Coronary artery disease    Cough    chronic  clear phlegm   Dysrhythmia    palpitations   GERD (gastroesophageal reflux disease)    h/o reflux/ hoarsness   Hematochezia    Hemorrhoids    History of chickenpox    History of colon polyps    History of Helicobacter pylori infection    Hoarseness    Hypertension    Lung cancer (HCC) 05/2016   Chemo + rad tx's.    Migraines    OSA (obstructive sleep apnea)    has CPAP but does not use   Personal history of tobacco use, presenting hazards to health 03/05/2016   Pneumonia    5/17   Raynaud disease    Raynaud disease    Raynaud's disease    Rotator  cuff tear    on right   Shortness of breath dyspnea    Sleep apnea    Ulcer (traumatic) of oral mucosa     Tobacco Use: Social History   Tobacco Use  Smoking Status Every Day   Current packs/day: 1.50   Average packs/day: 3.0 packs/day for 52.4 years (156.1 ttl pk-yrs)   Types: Cigarettes   Start date: 1973   Passive exposure: Current  Smokeless Tobacco Never    Labs:  Review Flowsheet        No data to display           Pulmonary Assessment Scores:  Pulmonary Assessment Scores     Row Name 01/26/24 0926         ADL UCSD   ADL Phase Entry     SOB Score total 111     Rest 4     Walk 5     Stairs 5     Bath 5     Dress 5     Shop 5       CAT Score   CAT Score 38              UCSD: Self-administered rating of dyspnea associated with activities of daily living (ADLs) 6-point scale (0 = "not at all" to 5 = "maximal or unable to do because of breathlessness")  Scoring Scores range from 0 to 120.  Minimally important difference is 5 units  CAT: CAT can identify the health impairment of COPD patients and is better correlated with disease progression.  CAT has a scoring range of zero to 40. The CAT score is classified into four groups of low (less than 10), medium (10 - 20), high (21-30) and very high (31-40) based on the impact level of disease on health status. A CAT score over 10 suggests significant symptoms.  A worsening CAT score could be explained by an exacerbation, poor medication adherence, poor inhaler technique, or progression of COPD or comorbid conditions.  CAT MCID is 2 points  mMRC: mMRC (Modified Medical Research Council) Dyspnea Scale is used to assess the degree of baseline functional disability in patients of respiratory disease due to dyspnea. No minimal important difference is established. A decrease in score of 1 point or greater is considered a positive change.   Pulmonary Function Assessment:   Exercise Target Goals: Exercise  Program Goal: Individual exercise prescription set using results from initial 6 min walk test and THRR while considering  patient's activity barriers and safety.   Exercise Prescription Goal: Initial exercise prescription builds to 30-45 minutes a day of aerobic activity, 2-3 days per week.  Home exercise guidelines will be given to patient during program as part of exercise prescription that the participant will acknowledge.  Education: Aerobic Exercise: - Group verbal and visual presentation on the components of exercise prescription. Introduces F.I.T.T principle from ACSM for exercise prescriptions.  Reviews F.I.T.T. principles of aerobic exercise including progression. Written material given at graduation.   Education: Resistance Exercise: - Group verbal and visual presentation on the components of exercise prescription. Introduces F.I.T.T principle from ACSM for exercise prescriptions  Reviews F.I.T.T. principles of resistance exercise including progression. Written material given at graduation.    Education: Exercise & Equipment Safety: - Individual verbal instruction and demonstration of equipment use and safety with use of the equipment. Flowsheet Row Pulmonary Rehab from 01/19/2024 in [facility] Cardiac and Pulmonary Rehab  Date 01/19/24  Educator [facility]  Instruction Review Code 1- Verbalizes Understanding       Education: Exercise Physiology & General Exercise Guidelines: - Group verbal and written instruction with models to review the exercise physiology of the cardiovascular system and associated critical values. Provides general exercise guidelines with specific guidelines to those with heart or lung disease.    Education: Flexibility, Balance, Mind/Body Relaxation: - Group verbal and visual presentation with interactive activity on the components of exercise prescription. Introduces F.I.T.T principle from ACSM for exercise prescriptions. Reviews F.I.T.T. principles  of flexibility and  balance exercise training including progression. Also discusses the mind body connection.  Reviews various relaxation techniques to help reduce and manage stress (i.e. Deep breathing, progressive muscle relaxation, and visualization). Balance handout provided to take home. Written material given at graduation.   Activity Barriers & Risk Stratification:  Activity Barriers & Cardiac Risk Stratification - 01/19/24 1420       Activity Barriers & Cardiac Risk Stratification   Activity Barriers Deconditioning;Muscular Weakness;Shortness of Breath             6 Minute Walk:  6 Minute Walk     Row Name 01/19/24 1417         6 Minute Walk   Phase Initial     Distance 625 feet     Walk Time 6 minutes     # of Rest Breaks 0     MPH 1.18     METS 2.3     RPE 15     Perceived Dyspnea  3     VO2 Peak 7.97     Symptoms No     Resting HR 93 bpm     Resting BP 112/58     Resting Oxygen Saturation  94 %     Exercise Oxygen Saturation  during 6 min walk 92 %     Max Ex. HR 105 bpm     Max Ex. BP 124/64     2 Minute Post BP 114/62       Interval HR   1 Minute HR 101     2 Minute HR 102     3 Minute HR 104     4 Minute HR 105     5 Minute HR 104     6 Minute HR 97     2 Minute Post HR 97     Interval Heart Rate? Yes       Interval Oxygen   Interval Oxygen? Yes     Baseline Oxygen Saturation % 94 %     1 Minute Oxygen Saturation % 94 %     1 Minute Liters of Oxygen 0 L     2 Minute Oxygen Saturation % 94 %     2 Minute Liters of Oxygen 0 L     3 Minute Oxygen Saturation % 94 %     3 Minute Liters of Oxygen 0 L     4 Minute Oxygen Saturation % 92 %     4 Minute Liters of Oxygen 0 L     5 Minute Oxygen Saturation % 92 %     5 Minute Liters of Oxygen 0 L     6 Minute Oxygen Saturation % 96 %     6 Minute Liters of Oxygen 0 L     2 Minute Post Oxygen Saturation % 95 %     2 Minute Post Liters of Oxygen 0 L             Oxygen Initial Assessment:  Oxygen Initial  Assessment - 01/19/24 1426       Home Oxygen   Home Oxygen Device Home Concentrator    Sleep Oxygen Prescription Continuous    Liters per minute 3    Home Exercise Oxygen Prescription Continuous    Liters per minute 3    Home Resting Oxygen Prescription None      Initial 6 min Walk   Oxygen Used None      Program Oxygen Prescription  Program Oxygen Prescription None      Intervention   Short Term Goals To learn and exhibit compliance with exercise, home and travel O2 prescription;To learn and understand importance of monitoring SPO2 with pulse oximeter and demonstrate accurate use of the pulse oximeter.;To learn and understand importance of maintaining oxygen saturations>88%;To learn and demonstrate proper pursed lip breathing techniques or other breathing techniques. ;To learn and demonstrate proper use of respiratory medications    Long  Term Goals Exhibits compliance with exercise, home  and travel O2 prescription;Verbalizes importance of monitoring SPO2 with pulse oximeter and return demonstration;Maintenance of O2 saturations>88%;Compliance with respiratory medication;Exhibits proper breathing techniques, such as pursed lip breathing or other method taught during program session;Demonstrates proper use of MDI's             Oxygen Re-Evaluation:  Oxygen Re-Evaluation     Row Name 01/26/24 0928             Program Oxygen Prescription   Program Oxygen Prescription None         Home Oxygen   Home Oxygen Device Home Concentrator       Sleep Oxygen Prescription Continuous       Liters per minute 3       Home Exercise Oxygen Prescription Continuous       Liters per minute 3       Home Resting Oxygen Prescription None       Compliance with Home Oxygen Use Yes         Goals/Expected Outcomes   Short Term Goals To learn and demonstrate proper pursed lip breathing techniques or other breathing techniques.        Long  Term Goals Exhibits proper breathing techniques, such  as pursed lip breathing or other method taught during program session       Comments Reviewed PLB technique with pt.  Talked about how it works and it's importance in maintaining their exercise saturations.       Goals/Expected Outcomes Short: Become more profiecient at using PLB. Long: Become independent at using PLB.                Oxygen Discharge (Final Oxygen Re-Evaluation):  Oxygen Re-Evaluation - 01/26/24 0928       Program Oxygen Prescription   Program Oxygen Prescription None      Home Oxygen   Home Oxygen Device Home Concentrator    Sleep Oxygen Prescription Continuous    Liters per minute 3    Home Exercise Oxygen Prescription Continuous    Liters per minute 3    Home Resting Oxygen Prescription None    Compliance with Home Oxygen Use Yes      Goals/Expected Outcomes   Short Term Goals To learn and demonstrate proper pursed lip breathing techniques or other breathing techniques.     Long  Term Goals Exhibits proper breathing techniques, such as pursed lip breathing or other method taught during program session    Comments Reviewed PLB technique with pt.  Talked about how it works and it's importance in maintaining their exercise saturations.    Goals/Expected Outcomes Short: Become more profiecient at using PLB. Long: Become independent at using PLB.             Initial Exercise Prescription:  Initial Exercise Prescription - 01/19/24 1400       Date of Initial Exercise RX and Referring Provider   Date 01/19/24    Referring Provider Dr. Merlene Stark  Oxygen   Oxygen Continuous    Liters 0   As needed   Maintain Oxygen Saturation 88% or higher      Recumbant Bike   Level 1    RPM 50    Watts 25    Minutes 15    METs 2.3      NuStep   Level 1    SPM 80    Minutes 15    METs 2.3      REL-XR   Level 1    Watts 25    Speed 25    Minutes 15    METs 2.3      Intensity   THRR 40-80% of Max Heartrate 117-141    Ratings of Perceived  Exertion 11-13    Perceived Dyspnea 0-4      Resistance Training   Training Prescription Yes    Weight 4lb    Reps 10-15             Perform Capillary Blood Glucose checks as needed.  Exercise Prescription Changes:   Exercise Prescription Changes     Row Name 01/19/24 1400 02/02/24 1100 02/16/24 1200         Response to Exercise   Blood Pressure (Admit) 112/58 128/84 122/60     Blood Pressure (Exercise) 124/64 138/58 130/62     Blood Pressure (Exit) 114/62 112/62 122/68     Heart Rate (Admit) 93 bpm 92 bpm 104 bpm     Heart Rate (Exercise) 105 bpm 93 bpm 104 bpm     Heart Rate (Exit) 97 bpm 88 bpm 99 bpm     Oxygen Saturation (Admit) 94 % 98 % 95 %     Oxygen Saturation (Exercise) 92 % 95 % 96 %     Oxygen Saturation (Exit) 95 % 95 % 97 %     Rating of Perceived Exertion (Exercise) 15 13 14      Perceived Dyspnea (Exercise) 3 3 0     Symptoms none none none     Comments results First full day of exercise --     Duration -- Progress to 30 minutes of  aerobic without signs/symptoms of physical distress Progress to 30 minutes of  aerobic without signs/symptoms of physical distress     Intensity -- THRR unchanged THRR unchanged       Progression   Progression -- Continue to progress workloads to maintain intensity without signs/symptoms of physical distress. Continue to progress workloads to maintain intensity without signs/symptoms of physical distress.     Average METs -- 2.08 2.27       Resistance Training   Training Prescription -- Yes Yes     Weight -- 4 lb 4 lb     Reps -- 10-15 10-15       Interval Training   Interval Training -- No No       Oxygen   Oxygen -- Continuous Continuous     Liters -- 0  As needed 0       Recumbant Bike   Level -- 1 1     Watts -- 25 25     Minutes -- 15 15     METs -- 3.05 3.04       NuStep   Level -- -- 2     Minutes -- -- 15     METs -- -- 1.5       T5 Nustep   Level -- 1 --  Minutes -- 15 --     METs --  1.1 --       Oxygen   Maintain Oxygen Saturation -- 88% or higher 88% or higher              Exercise Comments:   Exercise Comments     Row Name 01/26/24 0927           Exercise Comments First full day of exercise!  Patient was oriented to gym and equipment including functions, settings, policies, and procedures.  Patient's individual exercise prescription and treatment plan were reviewed.  All starting workloads were established based on the results of the 6 minute walk test done at initial orientation visit.  The plan for exercise progression was also introduced and progression will be customized based on patient's performance and goals.                Exercise Goals and Review:   Exercise Goals     Row Name 01/19/24 1424             Exercise Goals   Increase Physical Activity Yes       Intervention Provide advice, education, support and counseling about physical activity/exercise needs.;Develop an individualized exercise prescription for aerobic and resistive training based on initial evaluation findings, risk stratification, comorbidities and participant's personal goals.       Expected Outcomes Short Term: Attend rehab on a regular basis to increase amount of physical activity.;Long Term: Exercising regularly at least 3-5 days a week.;Long Term: Add in home exercise to make exercise part of routine and to increase amount of physical activity.       Increase Strength and Stamina Yes       Intervention Provide advice, education, support and counseling about physical activity/exercise needs.;Develop an individualized exercise prescription for aerobic and resistive training based on initial evaluation findings, risk stratification, comorbidities and participant's personal goals.       Expected Outcomes Short Term: Increase workloads from initial exercise prescription for resistance, speed, and METs.;Short Term: Perform resistance training exercises routinely during rehab  and add in resistance training at home;Long Term: Improve cardiorespiratory fitness, muscular endurance and strength as measured by increased METs and functional capacity ( )       Able to understand and use rate of perceived exertion (RPE) scale Yes       Intervention Provide education and explanation on how to use RPE scale       Expected Outcomes Short Term: Able to use RPE daily in rehab to express subjective intensity level;Long Term:  Able to use RPE to guide intensity level when exercising independently       Able to understand and use Dyspnea scale Yes       Intervention Provide education and explanation on how to use Dyspnea scale       Expected Outcomes Short Term: Able to use Dyspnea scale daily in rehab to express subjective sense of shortness of breath during exertion;Long Term: Able to use Dyspnea scale to guide intensity level when exercising independently       Knowledge and understanding of Target Heart Rate Range (THRR) Yes       Intervention Provide education and explanation of THRR including how the numbers were predicted and where they are located for reference       Expected Outcomes Short Term: Able to state/look up THRR;Short Term: Able to use daily as guideline for intensity in rehab;Long Term: Able to use THRR to govern intensity  when exercising independently       Able to check pulse independently Yes       Intervention Provide education and demonstration on how to check pulse in carotid and radial arteries.;Review the importance of being able to check your own pulse for safety during independent exercise       Expected Outcomes Long Term: Able to check pulse independently and accurately;Short Term: Able to explain why pulse checking is important during independent exercise       Understanding of Exercise Prescription Yes       Intervention Provide education, explanation, and written materials on patient's individual exercise prescription       Expected Outcomes Short  Term: Able to explain program exercise prescription;Long Term: Able to explain home exercise prescription to exercise independently                Exercise Goals Re-Evaluation :  Exercise Goals Re-Evaluation     Row Name 01/26/24 9147 02/02/24 1111 02/16/24 1239 02/29/24 0832 03/15/24 1400     Exercise Goal Re-Evaluation   Exercise Goals Review Able to understand and use rate of perceived exertion (RPE) scale;Able to understand and use Dyspnea scale;Knowledge and understanding of Target Heart Rate Range (THRR);Understanding of Exercise Prescription Increase Physical Activity;Increase Strength and Stamina;Understanding of Exercise Prescription Increase Physical Activity;Increase Strength and Stamina;Understanding of Exercise Prescription Increase Physical Activity;Increase Strength and Stamina;Understanding of Exercise Prescription Increase Physical Activity;Increase Strength and Stamina;Understanding of Exercise Prescription   Comments Reviewed RPE and dyspnea scale, THR and program prescription with pt today.  Pt voiced understanding and was given a copy of goals to take home. Paul Carpenter is off to a good start in the program. He was able to work at level 1 on both the recumbent bike and T5 nustep during his first session in rehab. He also did well with 4 lb hand weights for resistance training. We will continue to monitor his progress in the program. Paul Carpenter is doing well in the program. he was only able to attend one session during this review period. During his one session he was able to increase to level 2 on the T4 nustep. He was subsequently placed on medical hold, and we will continue to stay in contact. We will continue to monitor his progress upon his return. Paul Carpenter in on a medical hold due to new medication complications. He last attended on 02/02/2024 and we will stay in contact to determine when able to return. We will monitor his progress upon his return. Paul Carpenter in on a medical hold due to new medication  complications. He last attended on 02/02/2024 and we will stay in contact to determine when able to return. We will monitor his progress upon his return.   Expected Outcomes Short: Use RPE daily to regulate intensity. Long: Follow program prescription in THR. Short: Continue to follow current exercise prescription. Long: Continue exercise to improve strength and stamina. Short: Continue to follow current exercise prescription. Long: Continue exercise to improve strength and stamina. Short: Return to rehab when able. Long: Continue to exercise to improve strength and stamina Short: Return to rehab when able. Long: Continue to exercise to improve strength and stamina    Row Name 03/30/24 1135             Exercise Goal Re-Evaluation   Exercise Goals Review Increase Physical Activity;Increase Strength and Stamina;Understanding of Exercise Prescription       Comments Paul Carpenter continues to be on a medical hold due to new medication complications.  He last attended on 02/02/2024 and we will stay in contact to determine when able to return. We will monitor his progress upon his return.       Expected Outcomes Short: Return to rehab when able. Long: Continue to exercise to improve strength and stamina                Discharge Exercise Prescription (Final Exercise Prescription Changes):  Exercise Prescription Changes - 02/16/24 1200       Response to Exercise   Blood Pressure (Admit) 122/60    Blood Pressure (Exercise) 130/62    Blood Pressure (Exit) 122/68    Heart Rate (Admit) 104 bpm    Heart Rate (Exercise) 104 bpm    Heart Rate (Exit) 99 bpm    Oxygen Saturation (Admit) 95 %    Oxygen Saturation (Exercise) 96 %    Oxygen Saturation (Exit) 97 %    Rating of Perceived Exertion (Exercise) 14    Perceived Dyspnea (Exercise) 0    Symptoms none    Duration Progress to 30 minutes of  aerobic without signs/symptoms of physical distress    Intensity THRR unchanged      Progression   Progression  Continue to progress workloads to maintain intensity without signs/symptoms of physical distress.    Average METs 2.27      Resistance Training   Training Prescription Yes    Weight 4 lb    Reps 10-15      Interval Training   Interval Training No      Oxygen   Oxygen Continuous    Liters 0      Recumbant Bike   Level 1    Watts 25    Minutes 15    METs 3.04      NuStep   Level 2    Minutes 15    METs 1.5      Oxygen   Maintain Oxygen Saturation 88% or higher             Nutrition:  Target Goals: Understanding of nutrition guidelines, daily intake of sodium 1500mg , cholesterol 200mg , calories 30% from fat and 7% or less from saturated fats, daily to have 5 or more servings of fruits and vegetables.  Education: All About Nutrition: -Group instruction provided by verbal, written material, interactive activities, discussions, models, and posters to present general guidelines for heart healthy nutrition including fat, fiber, MyPlate, the role of sodium in heart healthy nutrition, utilization of the nutrition label, and utilization of this knowledge for meal planning. Follow up email sent as well. Written material given at graduation. Flowsheet Row Pulmonary Rehab from 01/26/2024 in [facility] Card[facility] and Pulmonary Re[facility]  Education need identified 01/26/24       Biometrics:  Pre Biometrics - 01/19/24 1424       Pre Biometrics   Height 5' 10.87" (1.8 m)    Weight 165 lb 9.6 oz (75.1 kg)    Waist Circumference 36 inches    Hip Circumference 34.5 inches    Waist to Hip Ratio 1.04 %    BMI (Calculated) 23.18    Single Leg Stand 5.5 seconds              Nutrition Therapy Plan and Nutrition Goals:   Nutrition Assessments:  MEDIFICTS Score Key: >=70 Need to make dietary changes  40-70 Heart Healthy Diet <= 40 Therapeutic Level Cholesterol Diet  Flowsheet Row Pulmonary Rehab from 01/18/2024 in [facility] Cardiac and Pulmonary Rehab  Picture Your Plate Total  Score on  Admission 39      Picture Your Plate Scores: <40 Unhealthy dietary pattern with much room for improvement. 41-50 Dietary pattern unlikely to meet recommendations for good health and room for improvement. 51-60 More healthful dietary pattern, with some room for improvement.  >60 Healthy dietary pattern, although there may be some specific behaviors that could be improved.   Nutrition Goals Re-Evaluation:   Nutrition Goals Discharge (Final Nutrition Goals Re-Evaluation):   Psychosocial: Target Goals: Acknowledge presence or absence of significant depression and/or stress, maximize coping skills, provide positive support system. Participant is able to verbalize types and ability to use techniques and skills needed for reducing stress and depression.   Education: Stress, Anxiety, and Depression - Group verbal and visual presentation to define topics covered.  Reviews how body is impacted by stress, anxiety, and depression.  Also discusses healthy ways to reduce stress and to treat/manage anxiety and depression.  Written material given at graduation.   Education: Sleep Hygiene -Provides group verbal and written instruction about how sleep can affect your health.  Define sleep hygiene, discuss sleep cycles and impact of sleep habits. Review good sleep hygiene tips.    Initial Review & Psychosocial Screening:  Initial Psych Review & Screening - 01/18/24 1039       Initial Review   Current issues with Current Stress Concerns;Current Sleep Concerns    Source of Stress Concerns Chronic Illness;Unable to participate in former interests or hobbies;Unable to perform yard/household activities      Family Dynamics   Good Support System? Yes   family     Barriers   Psychosocial barriers to participate in program The patient should benefit from training in stress management and relaxation.      Screening Interventions   Interventions Encouraged to exercise;Provide feedback about the scores  to participant;To provide support and resources with identified psychosocial needs    Expected Outcomes Short Term goal: Utilizing psychosocial counselor, staff and physician to assist with identification of specific Stressors or current issues interfering with healing process. Setting desired goal for each stressor or current issue identified.;Long Term Goal: Stressors or current issues are controlled or eliminated.;Short Term goal: Identification and review with participant of any Quality of Life or Depression concerns found by scoring the questionnaire.;Long Term goal: The participant improves quality of Life and PHQ9 Scores as seen by post scores and/or verbalization of changes             Quality of Life Scores:  Scores of 19 and below usually indicate a poorer quality of life in these areas.  A difference of  2-3 points is a clinically meaningful difference.  A difference of 2-3 points in the total score of the Quality of Life Index has been associated with significant improvement in overall quality of life, self-image, physical symptoms, and general health in studies assessing change in quality of life.  PHQ-9: Review Flowsheet  More data exists      01/19/2024 03/10/2023 10/05/2022 07/22/2022 07/20/2022  Depression screen PHQ 2/9  Decreased Interest 2 0 0 1 0  Down, Depressed, Hopeless 0 1 1 1  0  PHQ - 2 Score 2 1 1 2  0  Altered sleeping 2 - - 1 -  Tired, decreased energy 3 - - 2 -  Change in appetite 2 - - 3 -  Feeling bad or failure about yourself  2 - - 0 -  Trouble concentrating 1 - - 0 -  Moving slowly or fidgety/restless 3 - -  0 -  Suicidal thoughts 0 - - 0 -  PHQ-9 Score 15 - - 8 -  Difficult doing work/chores Extremely dIfficult - - Somewhat difficult -   Interpretation of Total Score  Total Score Depression Severity:  1-4 = Minimal depression, 5-9 = Mild depression, 10-14 = Moderate depression, 15-19 = Moderately severe depression, 20-27 = Severe depression    Psychosocial Evaluation and Intervention:  Psychosocial Evaluation - 01/18/24 1057       Psychosocial Evaluation & Interventions   Interventions Encouraged to exercise with the program and follow exercise prescription;Stress management education;Relaxation education    Comments Paul Carpenter is coming to pulmonary rehab with COPD. 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 He said he takes 41 pills and nebulizer treatments that wear him out. He is tired and unable to do what he used to do. He would like to get out to his job sites more, but is limited due to breathing and appointments. He currently is dealing with an infection around is port site which his doctors are managing. He wears oxygen as needed, but sometimes still struggles to keep it up during the night which alters his sleep. His wife and children are his main support system. He enjoys gardening when he has the time    Expected Outcomes Short: attend pulmonary rehab for educaiton and exercise Long: develop and maintain positive self care habits    Continue Psychosocial Services  Follow up required by staff             Psychosocial Re-Evaluation:   Psychosocial Discharge (Final Psychosocial Re-Evaluation):   Education: Education Goals: Education classes will be provided on a weekly basis, covering required topics. Participant will state understanding/return demonstration of topics presented.  Learning Barriers/Preferences:  Learning Barriers/Preferences - 01/18/24 1038       Learning Barriers/Preferences   Learning Barriers None    Learning Preferences None             General Pulmonary Education Topics:  Infection Prevention: - Provides verbal and written material to individual with discussion of infection control including proper hand washing and proper equipment cleaning during  exercise session. Flowsheet Row Pulmonary Rehab from 01/19/2024 in [facility] Cardiac and Pulmonary Rehab  Date 01/19/24  Educator [facility]  Instruction Review Code 1- Verbalizes Understanding       Falls Prevention: - Provides verbal and written material to individual with discussion of falls prevention and safety. Flowsheet Row Pulmonary Rehab from 01/19/2024 in [facility]monary Re[facility]  Date 01/19/24  Educator [facility]  Instruction Review Code 1- Verbalizes Understanding       Chronic Lung Disease Review: - Group verbal instruction with posters, models, PowerPoint presentations and videos,  to review new updates, new respiratory medications, new advancements in procedures and treatments. Providing information on websites and "800" numbers for continued self-education. Includes information about supplement oxygen, available portable oxygen systems, continuous and intermittent flow rates, oxygen safety, concentrators, and Medicare reimbursement for oxygen. Explanation of Pulmonary Drugs, including class, frequency, complications, importance of spacers, rinsing mouth after steroid MDI's, and proper cleaning methods for nebulizers. Review of basic lung anatomy and physiology related to function, structure, and complications of lung disease. Review of risk factors. Discussion about methods for diagnosing sleep apnea and types of masks and machines for OSA. Includes a review of the use of types  of environmental controls: home humidity, furnaces, filters, dust mite/pet prevention, HEPA vacuums. Discussion about weather changes, air quality and the benefits of nasal washing. Instruction on Warning signs, infection symptoms, calling MD promptly, preventive modes, and value of vaccinations. Review of effective airway clearance, coughing and/or vibration techniques. Emphasizing that all should Create an Action Plan. Written material given at graduation. Flowsheet Row Pulmonary Rehab from 01/26/2024 in [facility] Cardiac  and Pulmonary Rehab  Education need identified 01/26/24       AED/CPR: - Group verbal and written instruction with the use of models to demonstrate the basic use of the AED with the basic ABC's of resuscitation.    Anatomy and Cardiac Procedures: - Group verbal and visual presentation and models provide information about basic cardiac anatomy and function. Reviews the testing methods done to diagnose heart disease and the outcomes of the test results. Describes the treatment choices: Medical Management, Angioplasty, or Coronary Bypass Surgery for treating various heart conditions including Myocardial Infarction, Angina, Valve Disease, and Cardiac Arrhythmias.  Written material given at graduation.   Medication Safety: - Group verbal and visual instruction to review commonly prescribed medications for heart and lung disease. Reviews the medication, class of the drug, and side effects. Includes the steps to properly store meds and maintain the prescription regimen.  Written material given at graduation.   Other: -Provides group and verbal instruction on various topics (see comments)   Knowledge Questionnaire Score:  Knowledge Questionnaire Score - 01/26/24 0936       Knowledge Questionnaire Score   Pre Score 15/18              Core Components/Risk Factors/Patient Goals at Admission:  Personal Goals and Risk Factors at Admission - 01/18/24 1037       Core Components/Risk Factors/Patient Goals on Admission   Improve shortness of breath with ADL's Yes    Intervention Provide education, individualized exercise plan and daily activity instruction to help decrease symptoms of SOB with activities of daily living.    Expected Outcomes Short Term: Improve cardiorespiratory fitness to achieve a reduction of symptoms when performing ADLs;Long Term: Be able to perform more ADLs without symptoms or delay the onset of symptoms    Hypertension Yes   Medication to take if it gets too low or  if it is too high   Intervention Provide education on lifestyle modifcations including regular physical activity/exercise, weight management, moderate sodium restriction and increased consumption of fresh fruit, vegetables, and low fat dairy, alcohol moderation, and smoking cessation.;Monitor prescription use compliance.    Expected Outcomes Short Term: Continued assessment and intervention until BP is < 140/20mm HG in hypertensive participants. < 130/5mm HG in hypertensive participants with diabetes, heart failure or chronic kidney disease.;Long Term: Maintenance of blood pressure at goal levels.    Lipids Yes    Intervention Provide education and support for participant on nutrition & aerobic/resistive exercise along with prescribed medications to achieve LDL 70mg , HDL >40mg .    Expected Outcomes Short Term: Participant states understanding of desired cholesterol values and is compliant with medications prescribed. Participant is following exercise prescription and nutrition guidelines.;Long Term: Cholesterol controlled with medications as prescribed, with individualized exercise RX and with personalized nutrition plan. Value goals: LDL < 70mg , HDL > 40 mg.             Education:Diabetes - Individual verbal and written instruction to review signs/symptoms of diabetes, desired ranges of glucose level fasting, after meals and with exercise. Acknowledge that pre and post  exercise glucose checks will be done for 3 sessions at entry of program.   Know Your Numbers and Heart Failure: - Group verbal and visual instruction to discuss disease risk factors for cardiac and pulmonary disease and treatment options.  Reviews associated critical values for Overweight/Obesity, Hypertension, Cholesterol, and Diabetes.  Discusses basics of heart failure: signs/symptoms and treatments.  Introduces Heart Failure Zone chart for action plan for heart failure.  Written material given at graduation.   Core  Components/Risk Factors/Patient Goals Review:    Core Components/Risk Factors/Patient Goals at Discharge (Final Review):    ITP Comments:  ITP Comments     Row Name 01/18/24 1055 01/19/24 1417 01/26/24 0927 02/08/24 1209 02/08/24 1248   ITP Comments Initial phone call completed. Diagnosis can be found in [facility] 3/4. EP Orientation scheduled for Thursday 3/20 at 9:30. Completed and gym orientation. Initial ITP created and sent for review to Dr. Faud Aleskerov, Medical Director. First full day of exercise!  Patient was oriented to gym and equipment including functions, settings, policies, and procedures.  Patient's individual exercise prescription and treatment plan were reviewed.  All starting workloads were established based on the results of the 6 minute walk test done at initial orientation visit.  The plan for exercise progression was also introduced and progression will be customized based on patient's performance and goals. 30 Day review completed. Medical Director ITP review done, changes made as directed, and signed approval by Medical Director.    new to program Patient called to inform staff that he would like to be placed on a hold for one month due to a new medication complications and poor health at this time. He is hopeful that he will be able to return after that time frame. He will call staff back in a few weeks to share is decision to return or not.    Row Name 03/07/24 1346 03/14/24 1047 04/04/24 1025       ITP Comments 30 Day review completed. Medical Director ITP review done, changes made as directed, and signed approval by Medical Director. Called patient to check in. He states he had his port removed last week and has to wait for incision to heal before returning to the program. Plan to call next Wednesday (5/21) to see what his doctor suggests from his appt on the 20th. 30 Day review completed. Medical Director ITP review done, changes made as directed, and signed approval by  Medical Director.              Comments:

## 2024-04-05 ENCOUNTER — Encounter

## 2024-04-05 ENCOUNTER — Other Ambulatory Visit
Admission: RE | Admit: 2024-04-05 | Discharge: 2024-04-05 | Disposition: A | Discharge status: Home / Self Care | Source: Ambulatory Visit | Attending: Pulmonary Disease | Admitting: Pulmonary Disease

## 2024-04-05 DIAGNOSIS — A310 Pulmonary mycobacterial infection: Secondary | ICD-10-CM | POA: Diagnosis present

## 2024-04-05 LAB — ACID FAST SMEAR (AFB, MYCOBACTERIA): Acid Fast Smear: NEGATIVE

## 2024-04-06 ENCOUNTER — Other Ambulatory Visit
Admission: RE | Admit: 2024-04-06 | Discharge: 2024-04-06 | Disposition: A | Discharge status: Home / Self Care | Source: Ambulatory Visit | Attending: Pulmonary Disease | Admitting: Pulmonary Disease

## 2024-04-06 DIAGNOSIS — A493 Mycoplasma infection, unspecified site: Secondary | ICD-10-CM | POA: Insufficient documentation

## 2024-04-06 LAB — ACID FAST SMEAR (AFB, MYCOBACTERIA): Acid Fast Smear: NEGATIVE

## 2024-04-08 LAB — ACID FAST SMEAR (AFB, MYCOBACTERIA): Acid Fast Smear: NEGATIVE

## 2024-04-09 ENCOUNTER — Telehealth (INDEPENDENT_AMBULATORY_CARE_PROVIDER_SITE_OTHER): Payer: Self-pay | Admitting: Vascular Surgery

## 2024-04-09 NOTE — Telephone Encounter (Signed)
 Pt LVM at [facility] to speak with surgery scheduler to set up procedure w/JD for venogram with possible intervention (per note). Saw JD on 03/30/24.

## 2024-04-10 ENCOUNTER — Telehealth (INDEPENDENT_AMBULATORY_CARE_PROVIDER_SITE_OTHER): Payer: Self-pay

## 2024-04-10 ENCOUNTER — Encounter

## 2024-04-10 NOTE — Telephone Encounter (Signed)
 Spoke with the patient and he is scheduled with Dr. Vonna Guardian for a RUE venogram possible intervention on 04/16/24 with a 10:00 am arrival time to the [facility]. Pre-procedure instructions were discussed and will be sent to Mychart and mailed.

## 2024-04-12 ENCOUNTER — Other Ambulatory Visit: Payer: Self-pay | Admitting: Pulmonary Disease

## 2024-04-12 ENCOUNTER — Encounter

## 2024-04-12 DIAGNOSIS — J449 Chronic obstructive pulmonary disease, unspecified: Secondary | ICD-10-CM

## 2024-04-12 DIAGNOSIS — R0602 Shortness of breath: Secondary | ICD-10-CM

## 2024-04-12 DIAGNOSIS — R042 Hemoptysis: Secondary | ICD-10-CM

## 2024-04-16 ENCOUNTER — Other Ambulatory Visit: Payer: Self-pay

## 2024-04-16 ENCOUNTER — Ambulatory Visit
Admission: RE | Admit: 2024-04-16 | Discharge: 2024-04-16 | Disposition: A | Discharge status: Home / Self Care | Attending: Vascular Surgery | Admitting: Vascular Surgery

## 2024-04-16 ENCOUNTER — Encounter
Admission: RE | Disposition: A | Discharge status: Home / Self Care | Payer: Self-pay | Source: Home / Self Care | Attending: Vascular Surgery

## 2024-04-16 ENCOUNTER — Encounter: Payer: Self-pay | Admitting: Vascular Surgery

## 2024-04-16 DIAGNOSIS — C3491 Malignant neoplasm of unspecified part of right bronchus or lung: Secondary | ICD-10-CM | POA: Insufficient documentation

## 2024-04-16 DIAGNOSIS — C3490 Malignant neoplasm of unspecified part of unspecified bronchus or lung: Secondary | ICD-10-CM

## 2024-04-16 DIAGNOSIS — E782 Mixed hyperlipidemia: Secondary | ICD-10-CM | POA: Insufficient documentation

## 2024-04-16 DIAGNOSIS — I871 Compression of vein: Secondary | ICD-10-CM

## 2024-04-16 DIAGNOSIS — I110 Hypertensive heart disease with heart failure: Secondary | ICD-10-CM | POA: Diagnosis not present

## 2024-04-16 DIAGNOSIS — I82210 Acute embolism and thrombosis of superior vena cava: Secondary | ICD-10-CM | POA: Diagnosis present

## 2024-04-16 DIAGNOSIS — I82721 Chronic embolism and thrombosis of deep veins of right upper extremity: Secondary | ICD-10-CM | POA: Insufficient documentation

## 2024-04-16 DIAGNOSIS — F17210 Nicotine dependence, cigarettes, uncomplicated: Secondary | ICD-10-CM | POA: Diagnosis not present

## 2024-04-16 DIAGNOSIS — Z9889 Other specified postprocedural states: Secondary | ICD-10-CM | POA: Diagnosis not present

## 2024-04-16 DIAGNOSIS — I509 Heart failure, unspecified: Secondary | ICD-10-CM | POA: Insufficient documentation

## 2024-04-16 LAB — BUN: BUN: 7 mg/dL — ABNORMAL LOW (ref 8–23)

## 2024-04-16 LAB — CREATININE, SERUM
Creatinine, Ser: 0.67 mg/dL (ref 0.61–1.24)
GFR, Estimated: >60 mL/min (ref >60)

## 2024-04-16 SURGERY — UPPER EXTREMITY VENOGRAPHY
Anesthesia: Moderate Sedation | Laterality: Right

## 2024-04-16 MED ORDER — FENTANYL CITRATE (PF) 100 MCG/2ML IJ SOLN
INTRAMUSCULAR | Status: AC
  Filled 2024-04-16: qty 2

## 2024-04-16 MED ORDER — MIDAZOLAM HCL 2 MG/2ML IJ SOLN
INTRAMUSCULAR | Status: DC | PRN
Start: 2024-04-16 — End: 2024-04-16
  Administered 2024-04-16: 2 mg via INTRAVENOUS
  Administered 2024-04-16: 1 mg via INTRAVENOUS

## 2024-04-16 MED ORDER — FAMOTIDINE 20 MG PO TABS
40.0000 mg | ORAL_TABLET | Freq: Once | ORAL | Status: DC | PRN
Start: 2024-04-16 — End: 2024-04-16

## 2024-04-16 MED ORDER — HEPARIN SODIUM (PORCINE) 1000 UNIT/ML IJ SOLN
INTRAMUSCULAR | Status: DC | PRN
  Administered 2024-04-16: 4000 [IU] via INTRAVENOUS

## 2024-04-16 MED ORDER — HYDROMORPHONE HCL 1 MG/ML IJ SOLN: 1.0000 mg | Freq: Once | INTRAMUSCULAR | Status: DC | PRN

## 2024-04-16 MED ORDER — SODIUM CHLORIDE 0.9 % IV SOLN: INTRAVENOUS | Status: DC

## 2024-04-16 MED ORDER — DIPHENHYDRAMINE HCL 50 MG/ML IJ SOLN: 50.0000 mg | Freq: Once | INTRAMUSCULAR | Status: DC | PRN

## 2024-04-16 MED ORDER — CEFAZOLIN SODIUM-DEXTROSE 2-4 GM/100ML-% IV SOLN
2.0000 g | INTRAVENOUS | Status: AC
  Administered 2024-04-16: 2 g via INTRAVENOUS

## 2024-04-16 MED ORDER — HEPARIN SODIUM (PORCINE) 1000 UNIT/ML IJ SOLN
INTRAMUSCULAR | Status: AC
  Filled 2024-04-16: qty 10

## 2024-04-16 MED ORDER — MIDAZOLAM HCL 2 MG/ML PO SYRP
8.0000 mg | ORAL_SOLUTION | Freq: Once | ORAL | Status: AC | PRN
  Administered 2024-04-16: 8 mg via ORAL
  Filled 2024-04-16: qty 5

## 2024-04-16 MED ORDER — IODIXANOL 320 MG/ML IV SOLN
INTRAVENOUS | Status: DC | PRN
  Administered 2024-04-16: 30 mL

## 2024-04-16 MED ORDER — FENTANYL CITRATE (PF) 100 MCG/2ML IJ SOLN
INTRAMUSCULAR | Status: DC | PRN
Start: 2024-04-16 — End: 2024-04-16
  Administered 2024-04-16 (×2): 50 ug via INTRAVENOUS

## 2024-04-16 MED ORDER — MIDAZOLAM HCL 5 MG/5ML IJ SOLN
INTRAMUSCULAR | Status: AC
Start: 2024-04-16 — End: 2024-04-16
  Filled 2024-04-16: qty 5

## 2024-04-16 MED ORDER — METHYLPREDNISOLONE SODIUM SUCC 125 MG IJ SOLR: 125.0000 mg | Freq: Once | INTRAMUSCULAR | Status: DC | PRN

## 2024-04-16 MED ORDER — HEPARIN (PORCINE) IN NACL 1000-0.9 UT/500ML-% IV SOLN
INTRAVENOUS | Status: DC | PRN
  Administered 2024-04-16: 500 mL

## 2024-04-16 MED ORDER — LIDOCAINE-EPINEPHRINE (PF) 1 %-1:200000 IJ SOLN
INTRAMUSCULAR | Status: DC | PRN
  Administered 2024-04-16: 10 mL

## 2024-04-16 MED ORDER — ONDANSETRON HCL 4 MG/2ML IJ SOLN: 4.0000 mg | Freq: Four times a day (QID) | INTRAMUSCULAR | Status: DC | PRN

## 2024-04-16 SURGICAL SUPPLY — 14 items
BALLOON DORADO 8X100X80 (BALLOONS) IMPLANT
BALLOON LUTONIX 6X150X130 (BALLOONS) IMPLANT
CATH BEACON 5 .035 65 KMP TIP (CATHETERS) IMPLANT
CATH CXI 4F 90 DAV (CATHETERS) IMPLANT
COVER PROBE ULTRASOUND 5X96 (MISCELLANEOUS) IMPLANT
DEVICE PRESTO INFLATION (MISCELLANEOUS) IMPLANT
DRAPE BRACHIAL (DRAPES) IMPLANT
GLIDEWIRE ADV .035X180CM (WIRE) IMPLANT
KIT MICROPUNCTURE VSI 5F STIFF (SHEATH) IMPLANT
PACK ANGIOGRAPHY (CUSTOM PROCEDURE TRAY) IMPLANT
SHEATH BRITE TIP 6FRX5.5 (SHEATH) IMPLANT
STENT LIFESTAR 10X80 (Permanent Stent) IMPLANT
SUT MNCRL AB 4-0 PS2 18 (SUTURE) IMPLANT
WIRE SUPRACORE 300CM (WIRE) IMPLANT

## 2024-04-16 NOTE — Op Note (Signed)
 [place] [facility]   OPERATIVE NOTE  DATE: 04/16/2024  PRE-OPERATIVE DIAGNOSIS: 1.  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   Ultrasound guidance for vascular access to right basilic vein 2.   Catheter placement into superior vena cava and inferior vena cava from right basilic vein 3.   Right upper extremity venogram and superior venacavogram 4.   Percutaneous transluminal angioplasty of right subclavian vein with 6 and 8 mm diameter angioplasty balloons 5.   Stent placement to the right subclavian vein with 10 mm diameter by 8 cm length life star stent  SURGEON: Mikki Alexander, MD  ASSISTANT(S): None  ANESTHESIA: Local with moderate conscious sedation for approximately 37 minutes using 3 mg of Versed  and 100 mcg of Fentanyl   ESTIMATED BLOOD LOSS: 3 cc  FINDING(S): 1.  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  He has previously had a Port-A-Cath placed on the right side for lung cancer.  He had this removed but still has prominent varicosities with pain and swelling consistent with central venous occlusion. Risks and benefits were discussed and informed consent was obtained.   DESCRIPTION: After obtaining full informed written consent, the patient was brought back to the vascular suite and placed in supine position with their arms extended bilaterally. Moderate conscious sedation was administered during a face to face encounter with the patient throughout the procedure with my supervision of the RN administering medicines and monitoring the patient's vital signs, pulse oximetry, telemetry and mental status throughout from the start of the procedure until the patient was taken to the recovery  room.  After obtaining adequate anesthesia, the patient was prepped and draped in the standard fashion. The right basilic vein was identified with ultrasound and found to be patent. It was then accessed under direct ultrasound guidance without difficulty with a micropuncture needle and a permanent image was recorded. A micropuncture wire and sheath were then placed.  A J-wire and 6 French sheath were placed and the patient was given 4000 units of intravenous heparin .  Imaging showed the right basilic vein and axillary vein to be patent with occlusion of the right subclavian vein.  I was able to cross the occlusion and get into the superior vena cava which was patent.  I then advanced a CXI catheter down through the atrium and into the inferior vena cava confirming intraluminal flow and a patent IVC.  A long supra core wire was then placed.  Started with a 6 mm diameter by 15 cm length Lutonix drug-coated angioplasty balloon in the right subclavian and innominate veins and inflated this to 12 atm for 1 minute.  There still remained a waist and a high-grade residual stenosis in the right subclavian vein.  An 8 mm diameter by 10 cm length high-pressure angioplasty balloon was then inflated to 26 atm for 1 minute.  Completion imaging showed much improved flow but there was still marked irregularity of the subclavian vein with greater than 50% residual stenosis.  I then elected to stent this.  A 10 mm diameter by 8 cm length life star stent was selected and deployed in the right subclavian vein and just into the right innominate vein.  This was postdilated with an 8 mm balloon with excellent angiographic  completion result and less than 10% residual stenosis.  At this point, I elected to terminate the procedure. The sheath was removed and pressure were held at the access site in the right arm. The patient was awakened from anesthesia and taken to the recovery room in stable condition.  COMPLICATIONS:  None  CONDITION: Stable

## 2024-04-16 NOTE — Interval H&P Note (Signed)
 History and Physical Interval Note:  04/16/2024 10:09 AM  Paul Carpenter  has presented today for surgery, with the diagnosis of RUE Venogram w possible intervention    SVC occlusion.  The various methods of treatment have been discussed with the patient and family. After consideration of risks, benefits and other options for treatment, the patient has consented to  Procedure(s): UPPER EXTREMITY VENOGRAPHY (Right) as a surgical intervention.  The patient's history has been reviewed, patient examined, no change in status, stable for surgery.  I have reviewed the patient's chart and labs.  Questions were answered to the patient's satisfaction.     Shenequa Howse

## 2024-04-17 ENCOUNTER — Encounter

## 2024-04-17 ENCOUNTER — Encounter: Payer: Self-pay | Admitting: Vascular Surgery

## 2024-04-17 ENCOUNTER — Telehealth: Payer: Self-pay

## 2024-04-17 NOTE — Telephone Encounter (Signed)
 We called Pt today as he has not attended rehab since 02/02/2024. He stated that he recently had a venography and will follow up with the doctor in a few weeks. He will update us  on his status after this appt. We will place him on a Med Hold at this time.

## 2024-04-19 ENCOUNTER — Encounter

## 2024-04-19 LAB — ACID FAST ID BY PCR AND SUSCEPTIBILITIES

## 2024-04-23 LAB — ACID FAST ID BY PCR AND SUSCEPTIBILITIES
M Tuberculosis Complex: NEGATIVE
M Tuberculosis Complex: NEGATIVE
M avium complex: POSITIVE — AB
M avium complex: POSITIVE — AB

## 2024-04-23 LAB — ACID FAST CULTURE WITH REFLEXED SENSITIVITIES (MYCOBACTERIA)
Acid Fast Culture: POSITIVE — AB
Acid Fast Culture: POSITIVE — AB

## 2024-04-24 ENCOUNTER — Encounter

## 2024-04-24 ENCOUNTER — Telehealth (INDEPENDENT_AMBULATORY_CARE_PROVIDER_SITE_OTHER): Payer: Self-pay

## 2024-04-24 NOTE — Telephone Encounter (Signed)
 Patient left a message stating tat he was having occasionally pain with the right arm 9 out of 10. Patient stated that it feels like a dog bite. Patient had right ue venography on 04/16/24. I spoke with Dr Marea and he advised that Tramadol  50 mg could be call into pharmacy. Patient was notified and prescription was left on pharmacy voicemail.

## 2024-04-25 ENCOUNTER — Encounter: Payer: Self-pay | Admitting: *Deleted

## 2024-04-25 DIAGNOSIS — J449 Chronic obstructive pulmonary disease, unspecified: Secondary | ICD-10-CM

## 2024-04-25 NOTE — Progress Notes (Signed)
 Early Discharge Summary   Paul Carpenter DOB: 26-Sep-2056  Tim called to discuss returning to the program. He is still experiencing other medical issues at this time and it was decided it would be best to discharge from the program. He completed 3 of 36 sessions.    6 Minute Walk     Row Name 01/19/24 1417         6 Minute Walk   Phase Initial     Distance 625 feet     Walk Time 6 minutes     # of Rest Breaks 0     MPH 1.18     METS 2.3     RPE 15     Perceived Dyspnea  3     VO2 Peak 7.97     Symptoms No     Resting HR 93 bpm     Resting BP 112/58     Resting Oxygen Saturation  94 %     Exercise Oxygen Saturation  during 6 min walk 92 %     Max Ex. HR 105 bpm     Max Ex. BP 124/64     2 Minute Post BP 114/62       Interval HR   1 Minute HR 101     2 Minute HR 102     3 Minute HR 104     4 Minute HR 105     5 Minute HR 104     6 Minute HR 97     2 Minute Post HR 97     Interval Heart Rate? Yes       Interval Oxygen   Interval Oxygen? Yes     Baseline Oxygen Saturation % 94 %     1 Minute Oxygen Saturation % 94 %     1 Minute Liters of Oxygen 0 L     2 Minute Oxygen Saturation % 94 %     2 Minute Liters of Oxygen 0 L     3 Minute Oxygen Saturation % 94 %     3 Minute Liters of Oxygen 0 L     4 Minute Oxygen Saturation % 92 %     4 Minute Liters of Oxygen 0 L     5 Minute Oxygen Saturation % 92 %     5 Minute Liters of Oxygen 0 L     6 Minute Oxygen Saturation % 96 %     6 Minute Liters of Oxygen 0 L     2 Minute Post Oxygen Saturation % 95 %     2 Minute Post Liters of Oxygen 0 L

## 2024-04-25 NOTE — Progress Notes (Signed)
 Pulmonary Individual Treatment Plan  Patient Details  Name: Paul Carpenter MRN: [id number] Date of Birth: 1955-11-20 Referring Provider:   Flowsheet Row Pulmonary Rehab from 01/19/2024 in [facility] Cardiac and Pulmonary Rehab  Referring Provider Dr. Vinie Man    Initial Encounter Date:  Flowsheet Row Pulmonary Rehab from 01/19/2024 in [facility] Cardiac and Pulmonary Rehab  Date 01/19/24    Visit Diagnosis: Chronic obstructive pulmonary disease, unspecified COPD type (HCC)  Patient's Home Medications on Admission:  Current Outpatient Medications:    acetaminophen  (TYLENOL ) 500 MG tablet, Take 500 mg by mouth daily as needed., Disp: , Rfl:    albuterol  (PROVENTIL ) (2.5 MG/3ML) 0.083% nebulizer solution, Take 3 mLs (2.5 mg total) by nebulization 4 (four) times daily as needed for wheezing or shortness of breath., Disp: 75 mL, Rfl: 11   albuterol  (VENTOLIN  HFA) 108 (90 Base) MCG/ACT inhaler, INHALE 2 PUFFS BY MOUTH EVERY 4 HOURS AS NEEDED FOR WHEEZE OR FOR SHORTNESS OF BREATH, Disp: 6.7 each, Rfl: 2   amikacin  (AMIKIN ) 1 GM/4ML SOLN injection, Inject into the muscle daily. (Patient not taking: Reported on 04/16/2024), Disp: , Rfl:    ammonium lactate (LAC-HYDRIN) 12 % lotion, Apply topically., Disp: , Rfl:    arformoterol  (BROVANA ) 15 MCG/2ML NEBU, Take 2 mLs (15 mcg total) by nebulization 2 (two) times daily. Dx: J44.9, Disp: 120 mL, Rfl: 11   atorvastatin  (LIPITOR) 10 MG tablet, Take 10 mg by mouth daily., Disp: , Rfl:    azithromycin  (ZITHROMAX ) 500 MG tablet, TAKE 1 TABLET (500 MG TOTAL) BY MOUTH DAILY., Disp: 30 tablet, Rfl: 6   budesonide  (PULMICORT ) 0.5 MG/2ML nebulizer solution, Take 2 mLs (0.5 mg total) by nebulization 2 (two) times daily., Disp: 360 mL, Rfl: 3   cefdinir  (OMNICEF ) 300 MG capsule, Take 1 capsule (300 mg total) by mouth 2 (two) times daily. (Patient not taking: Reported on 04/16/2024), Disp: 14 capsule, Rfl: 0   chlorpheniramine-HYDROcodone  (TUSSIONEX) 10-8 MG/5ML, Take 5  mLs by mouth every 12 (twelve) hours as needed for cough., Disp: 115 mL, Rfl: 0   clofazimine  50 mg CAPS capsule (for compassionate use), Take 100 mg by mouth daily with breakfast., Disp: , Rfl:    clotrimazole  (MYCELEX ) 10 MG troche, Take 10 mg by mouth 3 (three) times daily., Disp: , Rfl:    cyanocobalamin (VITAMIN B12) 1000 MCG/ML injection, Inject 1 mL (1,000 mcg total) into the muscle monthly for 180 days, Disp: , Rfl:    diazepam  (VALIUM ) 5 MG tablet, Take 5 mg by mouth at bedtime as needed., Disp: , Rfl:    ethambutol  (MYAMBUTOL ) 400 MG tablet, Take 3 tablets (1,200 mg total) by mouth daily., Disp: 90 tablet, Rfl: 6   ferrous sulfate  325 (65 FE) MG tablet, Take 325 mg by mouth at bedtime., Disp: , Rfl:    Fluocinolone Acetonide 0.01 % OIL, PLEASE SEE ATTACHED FOR DETAILED DIRECTIONS, Disp: , Rfl:    folic acid  (FOLVITE ) 1 MG tablet, Take 1 mg by mouth daily., Disp: , Rfl:    guaiFENesin  (MUCINEX ) 600 MG 12 hr tablet, Take 1 tablet (600 mg total) by mouth 2 (two) times daily., Disp: 60 tablet, Rfl: 0   hydrocortisone  (CORTEF ) 10 MG tablet, Take 2 tablets in the morning and 1 tablet mid afternoon (2 to 4 PM), Disp: 90 tablet, Rfl: 0   levothyroxine  (SYNTHROID ) 175 MCG tablet, Take 175 mcg by mouth daily before breakfast., Disp: , Rfl:    lidocaine -prilocaine  (EMLA ) cream, APPLY 1 APPLICATION TOPICALLY AS NEEDED., Disp: 30  g, Rfl: 1   magnesium  oxide (MAG-OX) 400 MG tablet, Take 800 mg by mouth 2 (two) times daily., Disp: , Rfl:    melatonin 3 MG TABS tablet, Take 3 mg by mouth at bedtime as needed., Disp: , Rfl:    metoprolol  succinate (TOPROL -XL) 25 MG 24 hr tablet, Take 12.5-25 mg by mouth daily as needed (Acute High Blood Pressure)., Disp: , Rfl:    naproxen  sodium (ANAPROX  DS) 550 MG tablet, Take 1 tablet (550 mg total) by mouth 2 (two) times daily as needed for moderate pain (pain score 4-6). Take with meals, Disp: 30 tablet, Rfl: 1   nicotine  (NICODERM CQ  - DOSED IN MG/24 HOURS) 21  mg/24hr patch, Place 21 mg onto the skin daily., Disp: , Rfl:    nitroGLYCERIN  (NITROSTAT ) 0.4 MG SL tablet, Place under the tongue., Disp: , Rfl:    OXYGEN, Inhale 2 L into the lungs at bedtime., Disp: , Rfl:    pantoprazole  (PROTONIX ) 40 MG tablet, Take 1 tablet (40 mg total) by mouth 2 (two) times daily before meals, Disp: , Rfl:    promethazine -dextromethorphan  (PROMETHAZINE -DM) 6.25-15 MG/5ML syrup, Take 5 mLs by mouth 4 (four) times daily as needed for cough., Disp: 180 mL, Rfl: 1   sodium chloride  1 g tablet, Take 5 tablets (5 g total) by mouth 3 (three) times daily with meals., Disp: , Rfl:    tadalafil  (CIALIS ) 5 MG tablet, Take 1 tablet (5 mg total) by mouth daily as needed for erectile dysfunction., Disp: 90 tablet, Rfl: 3   YUPELRI  175 MCG/3ML nebulizer solution, TAKE 3 MLS (175 MCG TOTAL) BY NEBULIZATION DAILY. DX: J44.9, Disp: 90 mL, Rfl: 11 No current facility-administered medications for this visit.  Facility-Administered Medications Ordered in Other Visits:    heparin  lock flush 100 unit/mL, 500 Units, Intravenous, Once, [name], Evalene PARAS, MD  Past Medical History: Past Medical History:  Diagnosis Date   Anginal pain (HCC)    Anxiety    Asthma    Chest pain    CHF (congestive heart failure) (HCC)    Chicken pox    Complication of anesthesia    o2 dropped after neck fusion   COPD (chronic obstructive pulmonary disease) (HCC)    Coronary artery disease    Cough    chronic  clear phlegm   Dysrhythmia    palpitations   GERD (gastroesophageal reflux disease)    h/o reflux/ hoarsness   Hematochezia    Hemorrhoids    History of chickenpox    History of colon polyps    History of Helicobacter pylori infection    Hoarseness    Hypertension    Lung cancer (HCC) 05/2016   Chemo + rad tx's.    Migraines    OSA (obstructive sleep apnea)    has CPAP but does not use   Personal history of tobacco use, presenting hazards to health 03/05/2016   Pneumonia    5/17    Raynaud disease    Raynaud disease    Raynaud's disease    Rotator cuff tear    on right   Shortness of breath dyspnea    Sleep apnea    Ulcer (traumatic) of oral mucosa     Tobacco Use: Social History   Tobacco Use  Smoking Status Every Day   Current packs/day: 1.50   Average packs/day: 3.0 packs/day for 52.5 years (156.2 ttl pk-yrs)   Types: Cigarettes   Start date: 1973   Passive exposure: Current  Smokeless Tobacco  Never    Labs: Review Flowsheet        No data to display           Pulmonary Assessment Scores:  Pulmonary Assessment Scores     Row Name 01/26/24 0926         ADL UCSD   ADL Phase Entry     SOB Score total 111     Rest 4     Walk 5     Stairs 5     Bath 5     Dress 5     Shop 5       CAT Score   CAT Score 38        UCSD: Self-administered rating of dyspnea associated with activities of daily living (ADLs) 6-point scale (0 = not at all to 5 = maximal or unable to do because of breathlessness)  Scoring Scores range from 0 to 120.  Minimally important difference is 5 units  CAT: CAT can identify the health impairment of COPD patients and is better correlated with disease progression.  CAT has a scoring range of zero to 40. The CAT score is classified into four groups of low (less than 10), medium (10 - 20), high (21-30) and very high (31-40) based on the impact level of disease on health status. A CAT score over 10 suggests significant symptoms.  A worsening CAT score could be explained by an exacerbation, poor medication adherence, poor inhaler technique, or progression of COPD or comorbid conditions.  CAT MCID is 2 points  mMRC: mMRC (Modified Medical Research Council) Dyspnea Scale is used to assess the degree of baseline functional disability in patients of respiratory disease due to dyspnea. No minimal important difference is established. A decrease in score of 1 point or greater is considered a positive change.   Pulmonary  Function Assessment:   Exercise Target Goals: Exercise Program Goal: Individual exercise prescription set using results from initial 6 min walk test and THRR while considering  patient's activity barriers and safety.   Exercise Prescription Goal: Initial exercise prescription builds to 30-45 minutes a day of aerobic activity, 2-3 days per week.  Home exercise guidelines will be given to patient during program as part of exercise prescription that the participant will acknowledge.  Education: Aerobic Exercise: - Group verbal and visual presentation on the components of exercise prescription. Introduces F.I.T.T principle from ACSM for exercise prescriptions.  Reviews F.I.T.T. principles of aerobic exercise including progression. Written material given at graduation.   Education: Resistance Exercise: - Group verbal and visual presentation on the components of exercise prescription. Introduces F.I.T.T principle from ACSM for exercise prescriptions  Reviews F.I.T.T. principles of resistance exercise including progression. Written material given at graduation.    Education: Exercise & Equipment Safety: - Individual verbal instruction and demonstration of equipment use and safety with use of the equipment. Flowsheet Row Pulmonary Rehab from 01/19/2024 in [facility] Cardiac and Pulmonary Rehab  Date 01/19/24  Educator [facility]  Instruction Review Code 1- Verbalizes Understanding    Education: Exercise Physiology & General Exercise Guidelines: - Group verbal and written instruction with models to review the exercise physiology of the cardiovascular system and associated critical values. Provides general exercise guidelines with specific guidelines to those with heart or lung disease.    Education: Flexibility, Balance, Mind/Body Relaxation: - Group verbal and visual presentation with interactive activity on the components of exercise prescription. Introduces F.I.T.T principle from ACSM for exercise  prescriptions. Reviews F.I.T.T. principles of flexibility and  balance exercise training including progression. Also discusses the mind body connection.  Reviews various relaxation techniques to help reduce and manage stress (i.e. Deep breathing, progressive muscle relaxation, and visualization). Balance handout provided to take home. Written material given at graduation.   Activity Barriers & Risk Stratification:  Activity Barriers & Cardiac Risk Stratification - 01/19/24 1420       Activity Barriers & Cardiac Risk Stratification   Activity Barriers Deconditioning;Muscular Weakness;Shortness of Breath          6 Minute Walk:  6 Minute Walk     Row Name 01/19/24 1417         6 Minute Walk   Phase Initial     Distance 625 feet     Walk Time 6 minutes     # of Rest Breaks 0     MPH 1.18     METS 2.3     RPE 15     Perceived Dyspnea  3     VO2 Peak 7.97     Symptoms No     Resting HR 93 bpm     Resting BP 112/58     Resting Oxygen Saturation  94 %     Exercise Oxygen Saturation  during 6 min walk 92 %     Max Ex. HR 105 bpm     Max Ex. BP 124/64     2 Minute Post BP 114/62       Interval HR   1 Minute HR 101     2 Minute HR 102     3 Minute HR 104     4 Minute HR 105     5 Minute HR 104     6 Minute HR 97     2 Minute Post HR 97     Interval Heart Rate? Yes       Interval Oxygen   Interval Oxygen? Yes     Baseline Oxygen Saturation % 94 %     1 Minute Oxygen Saturation % 94 %     1 Minute Liters of Oxygen 0 L     2 Minute Oxygen Saturation % 94 %     2 Minute Liters of Oxygen 0 L     3 Minute Oxygen Saturation % 94 %     3 Minute Liters of Oxygen 0 L     4 Minute Oxygen Saturation % 92 %     4 Minute Liters of Oxygen 0 L     5 Minute Oxygen Saturation % 92 %     5 Minute Liters of Oxygen 0 L     6 Minute Oxygen Saturation % 96 %     6 Minute Liters of Oxygen 0 L     2 Minute Post Oxygen Saturation % 95 %     2 Minute Post Liters of Oxygen 0 L        Oxygen Initial Assessment:  Oxygen Initial Assessment - 01/19/24 1426       Home Oxygen   Home Oxygen Device Home Concentrator    Sleep Oxygen Prescription Continuous    Liters per minute 3    Home Exercise Oxygen Prescription Continuous    Liters per minute 3    Home Resting Oxygen Prescription None      Initial 6 min Walk   Oxygen Used None      Program Oxygen Prescription   Program Oxygen Prescription None      Intervention  Short Term Goals To learn and exhibit compliance with exercise, home and travel O2 prescription;To learn and understand importance of monitoring SPO2 with pulse oximeter and demonstrate accurate use of the pulse oximeter.;To learn and understand importance of maintaining oxygen saturations>88%;To learn and demonstrate proper pursed lip breathing techniques or other breathing techniques. ;To learn and demonstrate proper use of respiratory medications    Long  Term Goals Exhibits compliance with exercise, home  and travel O2 prescription;Verbalizes importance of monitoring SPO2 with pulse oximeter and return demonstration;Maintenance of O2 saturations>88%;Compliance with respiratory medication;Exhibits proper breathing techniques, such as pursed lip breathing or other method taught during program session;Demonstrates proper use of MDI's          Oxygen Re-Evaluation:  Oxygen Re-Evaluation     Row Name 01/26/24 0928             Program Oxygen Prescription   Program Oxygen Prescription None         Home Oxygen   Home Oxygen Device Home Concentrator       Sleep Oxygen Prescription Continuous       Liters per minute 3       Home Exercise Oxygen Prescription Continuous       Liters per minute 3       Home Resting Oxygen Prescription None       Compliance with Home Oxygen Use Yes         Goals/Expected Outcomes   Short Term Goals To learn and demonstrate proper pursed lip breathing techniques or other breathing techniques.        Long  Term Goals  Exhibits proper breathing techniques, such as pursed lip breathing or other method taught during program session       Comments Reviewed PLB technique with pt.  Talked about how it works and it's importance in maintaining their exercise saturations.       Goals/Expected Outcomes Short: Become more profiecient at using PLB. Long: Become independent at using PLB.          Oxygen Discharge (Final Oxygen Re-Evaluation):  Oxygen Re-Evaluation - 01/26/24 0928       Program Oxygen Prescription   Program Oxygen Prescription None      Home Oxygen   Home Oxygen Device Home Concentrator    Sleep Oxygen Prescription Continuous    Liters per minute 3    Home Exercise Oxygen Prescription Continuous    Liters per minute 3    Home Resting Oxygen Prescription None    Compliance with Home Oxygen Use Yes      Goals/Expected Outcomes   Short Term Goals To learn and demonstrate proper pursed lip breathing techniques or other breathing techniques.     Long  Term Goals Exhibits proper breathing techniques, such as pursed lip breathing or other method taught during program session    Comments Reviewed PLB technique with pt.  Talked about how it works and it's importance in maintaining their exercise saturations.    Goals/Expected Outcomes Short: Become more profiecient at using PLB. Long: Become independent at using PLB.          Initial Exercise Prescription:  Initial Exercise Prescription - 01/19/24 1400       Date of Initial Exercise RX and Referring Provider   Date 01/19/24    Referring Provider Dr. Kenneth Olivier      Oxygen   Oxygen Continuous    Liters 0   As needed   Maintain Oxygen Saturation 88% or higher  Recumbant Bike   Level 1    RPM 50    Watts 25    Minutes 15    METs 2.3      NuStep   Level 1    SPM 80    Minutes 15    METs 2.3      REL-XR   Level 1    Watts 25    Speed 25    Minutes 15    METs 2.3      Intensity   THRR 40-80% of Max Heartrate 117-141     Ratings of Perceived Exertion 11-13    Perceived Dyspnea 0-4      Resistance Training   Training Prescription Yes    Weight 4lb    Reps 10-15          Perform Capillary Blood Glucose checks as needed.  Exercise Prescription Changes:   Exercise Prescription Changes     Row Name 01/19/24 1400 02/02/24 1100 02/16/24 1200         Response to Exercise   Blood Pressure (Admit) 112/58 128/84 122/60     Blood Pressure (Exercise) 124/64 138/58 130/62     Blood Pressure (Exit) 114/62 112/62 122/68     Heart Rate (Admit) 93 bpm 92 bpm 104 bpm     Heart Rate (Exercise) 105 bpm 93 bpm 104 bpm     Heart Rate (Exit) 97 bpm 88 bpm 99 bpm     Oxygen Saturation (Admit) 94 % 98 % 95 %     Oxygen Saturation (Exercise) 92 % 95 % 96 %     Oxygen Saturation (Exit) 95 % 95 % 97 %     Rating of Perceived Exertion (Exercise) 15 13 14      Perceived Dyspnea (Exercise) 3 3 0     Symptoms none none none     Comments results First full day of exercise --     Duration -- Progress to 30 minutes of  aerobic without signs/symptoms of physical distress Progress to 30 minutes of  aerobic without signs/symptoms of physical distress     Intensity -- THRR unchanged THRR unchanged       Progression   Progression -- Continue to progress workloads to maintain intensity without signs/symptoms of physical distress. Continue to progress workloads to maintain intensity without signs/symptoms of physical distress.     Average METs -- 2.08 2.27       Resistance Training   Training Prescription -- Yes Yes     Weight -- 4 lb 4 lb     Reps -- 10-15 10-15       Interval Training   Interval Training -- No No       Oxygen   Oxygen -- Continuous Continuous     Liters -- 0  As needed 0       Recumbant Bike   Level -- 1 1     Watts -- 25 25     Minutes -- 15 15     METs -- 3.05 3.04       NuStep   Level -- -- 2     Minutes -- -- 15     METs -- -- 1.5       T5 Nustep   Level -- 1 --     Minutes --  15 --     METs -- 1.1 --       Oxygen   Maintain Oxygen Saturation -- 88% or higher 88%  or higher        Exercise Comments:   Exercise Comments     Row Name 01/26/24 [phone]           Exercise Comments First full day of exercise!  Patient was oriented to gym and equipment including functions, settings, policies, and procedures.  Patient's individual exercise prescription and treatment plan were reviewed.  All starting workloads were established based on the results of the 6 minute walk test done at initial orientation visit.  The plan for exercise progression was also introduced and progression will be customized based on patient's performance and goals.          Exercise Goals and Review:   Exercise Goals     Row Name 01/19/24 1424             Exercise Goals   Increase Physical Activity Yes       Intervention Provide advice, education, support and counseling about physical activity/exercise needs.;Develop an individualized exercise prescription for aerobic and resistive training based on initial evaluation findings, risk stratification, comorbidities and participant's personal goals.       Expected Outcomes Short Term: Attend rehab on a regular basis to increase amount of physical activity.;Long Term: Exercising regularly at least 3-5 days a week.;Long Term: Add in home exercise to make exercise part of routine and to increase amount of physical activity.       Increase Strength and Stamina Yes       Intervention Provide advice, education, support and counseling about physical activity/exercise needs.;Develop an individualized exercise prescription for aerobic and resistive training based on initial evaluation findings, risk stratification, comorbidities and participant's personal goals.       Expected Outcomes Short Term: Increase workloads from initial exercise prescription for resistance, speed, and METs.;Short Term: Perform resistance training exercises routinely during rehab  and add in resistance training at home;Long Term: Improve cardiorespiratory fitness, muscular endurance and strength as measured by increased METs and functional capacity ( )       Able to understand and use rate of perceived exertion (RPE) scale Yes       Intervention Provide education and explanation on how to use RPE scale       Expected Outcomes Short Term: Able to use RPE daily in rehab to express subjective intensity level;Long Term:  Able to use RPE to guide intensity level when exercising independently       Able to understand and use Dyspnea scale Yes       Intervention Provide education and explanation on how to use Dyspnea scale       Expected Outcomes Short Term: Able to use Dyspnea scale daily in rehab to express subjective sense of shortness of breath during exertion;Long Term: Able to use Dyspnea scale to guide intensity level when exercising independently       Knowledge and understanding of Target Heart Rate Range (THRR) Yes       Intervention Provide education and explanation of THRR including how the numbers were predicted and where they are located for reference       Expected Outcomes Short Term: Able to state/look up THRR;Short Term: Able to use daily as guideline for intensity in rehab;Long Term: Able to use THRR to govern intensity when exercising independently       Able to check pulse independently Yes       Intervention Provide education and demonstration on how to check pulse in carotid and radial arteries.;Review the importance of being able to  check your own pulse for safety during independent exercise       Expected Outcomes Long Term: Able to check pulse independently and accurately;Short Term: Able to explain why pulse checking is important during independent exercise       Understanding of Exercise Prescription Yes       Intervention Provide education, explanation, and written materials on patient's individual exercise prescription       Expected Outcomes Short  Term: Able to explain program exercise prescription;Long Term: Able to explain home exercise prescription to exercise independently          Exercise Goals Re-Evaluation :  Exercise Goals Re-Evaluation     Row Name 01/26/24 9071 02/02/24 1111 02/16/24 1239 02/29/24 0832 03/15/24 1400     Exercise Goal Re-Evaluation   Exercise Goals Review Able to understand and use rate of perceived exertion (RPE) scale;Able to understand and use Dyspnea scale;Knowledge and understanding of Target Heart Rate Range (THRR);Understanding of Exercise Prescription Increase Physical Activity;Increase Strength and Stamina;Understanding of Exercise Prescription Increase Physical Activity;Increase Strength and Stamina;Understanding of Exercise Prescription Increase Physical Activity;Increase Strength and Stamina;Understanding of Exercise Prescription Increase Physical Activity;Increase Strength and Stamina;Understanding of Exercise Prescription   Comments Reviewed RPE and dyspnea scale, THR and program prescription with pt today.  Pt voiced understanding and was given a copy of goals to take home. Velinda is off to a good start in the program. He was able to work at level 1 on both the recumbent bike and T5 nustep during his first session in rehab. He also did well with 4 lb hand weights for resistance training. We will continue to monitor his progress in the program. Velinda is doing well in the program. he was only able to attend one session during this review period. During his one session he was able to increase to level 2 on the T4 nustep. He was subsequently placed on medical hold, and we will continue to stay in contact. We will continue to monitor his progress upon his return. Tim in on a medical hold due to new medication complications. He last attended on 02/02/2024 and we will stay in contact to determine when able to return. We will monitor his progress upon his return. Tim in on a medical hold due to new medication  complications. He last attended on 02/02/2024 and we will stay in contact to determine when able to return. We will monitor his progress upon his return.   Expected Outcomes Short: Use RPE daily to regulate intensity. Long: Follow program prescription in THR. Short: Continue to follow current exercise prescription. Long: Continue exercise to improve strength and stamina. Short: Continue to follow current exercise prescription. Long: Continue exercise to improve strength and stamina. Short: Return to rehab when able. Long: Continue to exercise to improve strength and stamina Short: Return to rehab when able. Long: Continue to exercise to improve strength and stamina    Row Name 03/30/24 1135 04/12/24 1619           Exercise Goal Re-Evaluation   Exercise Goals Review Increase Physical Activity;Increase Strength and Stamina;Understanding of Exercise Prescription Increase Physical Activity;Increase Strength and Stamina;Understanding of Exercise Prescription      Comments Velinda continues to be on a medical hold due to new medication complications. He last attended on 02/02/2024 and we will stay in contact to determine when able to return. We will monitor his progress upon his return. Tim continues to be on a medical hold due to new medication complications. He last  attended on 02/02/2024 and we will stay in contact to determine when able to return. We will monitor his progress upon his return.      Expected Outcomes Short: Return to rehab when able. Long: Continue to exercise to improve strength and stamina Short: Return to rehab when able. Long: Continue to exercise to improve strength and stamina         Discharge Exercise Prescription (Final Exercise Prescription Changes):  Exercise Prescription Changes - 02/16/24 1200       Response to Exercise   Blood Pressure (Admit) 122/60    Blood Pressure (Exercise) 130/62    Blood Pressure (Exit) 122/68    Heart Rate (Admit) 104 bpm    Heart Rate (Exercise) 104  bpm    Heart Rate (Exit) 99 bpm    Oxygen Saturation (Admit) 95 %    Oxygen Saturation (Exercise) 96 %    Oxygen Saturation (Exit) 97 %    Rating of Perceived Exertion (Exercise) 14    Perceived Dyspnea (Exercise) 0    Symptoms none    Duration Progress to 30 minutes of  aerobic without signs/symptoms of physical distress    Intensity THRR unchanged      Progression   Progression Continue to progress workloads to maintain intensity without signs/symptoms of physical distress.    Average METs 2.27      Resistance Training   Training Prescription Yes    Weight 4 lb    Reps 10-15      Interval Training   Interval Training No      Oxygen   Oxygen Continuous    Liters 0      Recumbant Bike   Level 1    Watts 25    Minutes 15    METs 3.04      NuStep   Level 2    Minutes 15    METs 1.5      Oxygen   Maintain Oxygen Saturation 88% or higher          Nutrition:  Target Goals: Understanding of nutrition guidelines, daily intake of sodium 1500mg , cholesterol 200mg , calories 30% from fat and 7% or less from saturated fats, daily to have 5 or more servings of fruits and vegetables.  Education: All About Nutrition: -Group instruction provided by verbal, written material, interactive activities, discussions, models, and posters to present general guidelines for heart healthy nutrition including fat, fiber, MyPlate, the role of sodium in heart healthy nutrition, utilization of the nutrition label, and utilization of this knowledge for meal planning. Follow up email sent as well. Written material given at graduation. Flowsheet Row Pulmonary Rehab from 01/26/2024 in [facility] Cardiac and Pulmonary Rehab  Education need identified 01/26/24    Biometrics:  Pre Biometrics - 01/19/24 1424       Pre Biometrics   Height 5' 10.87 (1.8 m)    Weight 165 lb 9.6 oz (75.1 kg)    Waist Circumference 36 inches    Hip Circumference 34.5 inches    Waist to Hip Ratio 1.04 %    BMI  (Calculated) 23.18    Single Leg Stand 5.5 seconds           Nutrition Therapy Plan and Nutrition Goals:   Nutrition Assessments:  MEDIFICTS Score Key: >=70 Need to make dietary changes  40-70 Heart Healthy Diet <= 40 Therapeutic Level Cholesterol Diet  Flowsheet Row Pulmonary Rehab from 01/18/2024 in [facility] Cardiac and Pulmonary Rehab  Picture Your Plate Total Score on Admission  39   Picture Your Plate Scores: <59 Unhealthy dietary pattern with much room for improvement. 41-50 Dietary pattern unlikely to meet recommendations for good health and room for improvement. 51-60 More healthful dietary pattern, with some room for improvement.  >60 Healthy dietary pattern, although there may be some specific behaviors that could be improved.   Nutrition Goals Re-Evaluation:   Nutrition Goals Discharge (Final Nutrition Goals Re-Evaluation):   Psychosocial: Target Goals: Acknowledge presence or absence of significant depression and/or stress, maximize coping skills, provide positive support system. Participant is able to verbalize types and ability to use techniques and skills needed for reducing stress and depression.   Education: Stress, Anxiety, and Depression - Group verbal and visual presentation to define topics covered.  Reviews how body is impacted by stress, anxiety, and depression.  Also discusses healthy ways to reduce stress and to treat/manage anxiety and depression.  Written material given at graduation.   Education: Sleep Hygiene -Provides group verbal and written instruction about how sleep can affect your health.  Define sleep hygiene, discuss sleep cycles and impact of sleep habits. Review good sleep hygiene tips.    Initial Review & Psychosocial Screening:  Initial Psych Review & Screening - 01/18/24 1039       Initial Review   Current issues with Current Stress Concerns;Current Sleep Concerns    Source of Stress Concerns Chronic Illness;Unable to participate  in former interests or hobbies;Unable to perform yard/household activities      Family Dynamics   Good Support System? Yes   family     Barriers   Psychosocial barriers to participate in program The patient should benefit from training in stress management and relaxation.      Screening Interventions   Interventions Encouraged to exercise;Provide feedback about the scores to participant;To provide support and resources with identified psychosocial needs    Expected Outcomes Short Term goal: Utilizing psychosocial counselor, staff and physician to assist with identification of specific Stressors or current issues interfering with healing process. Setting desired goal for each stressor or current issue identified.;Long Term Goal: Stressors or current issues are controlled or eliminated.;Short Term goal: Identification and review with participant of any Quality of Life or Depression concerns found by scoring the questionnaire.;Long Term goal: The participant improves quality of Life and PHQ9 Scores as seen by post scores and/or verbalization of changes          Quality of Life Scores:  Scores of 19 and below usually indicate a poorer quality of life in these areas.  A difference of  2-3 points is a clinically meaningful difference.  A difference of 2-3 points in the total score of the Quality of Life Index has been associated with significant improvement in overall quality of life, self-image, physical symptoms, and general health in studies assessing change in quality of life.  PHQ-9: Review Flowsheet  More data exists      01/19/2024 03/10/2023 10/05/2022 07/22/2022 07/20/2022  Depression screen PHQ 2/9  Decreased Interest 2 0 0 1 0  Down, Depressed, Hopeless 0 1 1 1  0  PHQ - 2 Score 2 1 1 2  0  Altered sleeping 2 - - 1 -  Tired, decreased energy 3 - - 2 -  Change in appetite 2 - - 3 -  Feeling bad or failure about yourself  2 - - 0 -  Trouble concentrating 1 - - 0 -  Moving slowly or  fidgety/restless 3 - - 0 -  Suicidal thoughts 0 - -  0 -  PHQ-9 Score 15 - - 8 -  Difficult doing work/chores Extremely dIfficult - - Somewhat difficult -   Interpretation of Total Score  Total Score Depression Severity:  1-4 = Minimal depression, 5-9 = Mild depression, 10-14 = Moderate depression, 15-19 = Moderately severe depression, 20-27 = Severe depression   Psychosocial Evaluation and Intervention:  Psychosocial Evaluation - 01/18/24 1057       Psychosocial Evaluation & Interventions   Interventions Encouraged to exercise with the program and follow exercise prescription;Stress management education;Relaxation education    Comments Mr. Boehringer is coming to pulmonary rehab with COPD. 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 He said he takes 41 pills and nebulizer treatments that wear him out. He is tired and unable to do what he used to do. He would like to get out to his job sites more, but is limited due to breathing and appointments. He currently is dealing with an infection around is port site which his doctors are managing. He wears oxygen as needed, but sometimes still struggles to keep it up during the night which alters his sleep. His wife and children are his main support system. He enjoys gardening when he has the time    Expected Outcomes Short: attend pulmonary rehab for educaiton and exercise Long: develop and maintain positive self care habits    Continue Psychosocial Services  Follow up required by staff          Psychosocial Re-Evaluation:   Psychosocial Discharge (Final Psychosocial Re-Evaluation):   Education: Education Goals: Education classes will be provided on a weekly basis, covering required topics. Participant will state understanding/return demonstration of topics presented.  Learning Barriers/Preferences:  Learning  Barriers/Preferences - 01/18/24 1038       Learning Barriers/Preferences   Learning Barriers None    Learning Preferences None          General Pulmonary Education Topics:  Infection Prevention: - Provides verbal and written material to individual with discussion of infection control including proper hand washing and proper equipment cleaning during exercise session. Flowsheet Row Pulmonary Rehab from 01/19/2024 in [facility] Cardiac and Pulmonary Rehab  Date 01/19/24  Educator [facility]  Instruction Review Code 1- Verbalizes Understanding    Falls Prevention: - Provides verbal and written material to individual with discussion of falls prevention and safety. Flowsheet Row Pulmonary Rehab from 01/19/2024 in [facility] Cardiac and Pulmonary Rehab  Date 01/19/24  Educator [facility]  Instruction Review Code 1- Verbalizes Understanding    Chronic Lung Disease Review: - Group verbal instruction with posters, models, PowerPoint presentations and videos,  to review new updates, new respiratory medications, new advancements in procedures and treatments. Providing information on websites and 800 numbers for continued self-education. Includes information about supplement oxygen, available portable oxygen systems, continuous and intermittent flow rates, oxygen safety, concentrators, and Medicare reimbursement for oxygen. Explanation of Pulmonary Drugs, including class, frequency, complications, importance of spacers, rinsing mouth after steroid MDI's, and proper cleaning methods for nebulizers. Review of basic lung anatomy and physiology related to function, structure, and complications of lung disease. Review of risk factors. Discussion about methods for diagnosing sleep apnea and types of masks and machines for OSA. Includes a review of the use of types of environmental controls: home humidity, furnaces, filters, dust mite/pet prevention, HEPA vacuums. Discussion about weather changes, air quality and the benefits  of  nasal washing. Instruction on Warning signs, infection symptoms, calling MD promptly, preventive modes, and value of vaccinations. Review of effective airway clearance, coughing and/or vibration techniques. Emphasizing that all should Create an Action Plan. Written material given at graduation. Flowsheet Row Pulmonary Rehab from 01/26/2024 in [facility] Cardiac and Pulmonary Rehab  Education need identified 01/26/24    AED/CPR: - Group verbal and written instruction with the use of models to demonstrate the basic use of the AED with the basic ABC's of resuscitation.    Anatomy and Cardiac Procedures: - Group verbal and visual presentation and models provide information about basic cardiac anatomy and function. Reviews the testing methods done to diagnose heart disease and the outcomes of the test results. Describes the treatment choices: Medical Management, Angioplasty, or Coronary Bypass Surgery for treating various heart conditions including Myocardial Infarction, Angina, Valve Disease, and Cardiac Arrhythmias.  Written material given at graduation.   Medication Safety: - Group verbal and visual instruction to review commonly prescribed medications for heart and lung disease. Reviews the medication, class of the drug, and side effects. Includes the steps to properly store meds and maintain the prescription regimen.  Written material given at graduation.   Other: -Provides group and verbal instruction on various topics (see comments)   Knowledge Questionnaire Score:  Knowledge Questionnaire Score - 01/26/24 0936       Knowledge Questionnaire Score   Pre Score 15/18           Core Components/Risk Factors/Patient Goals at Admission:  Personal Goals and Risk Factors at Admission - 01/18/24 1037       Core Components/Risk Factors/Patient Goals on Admission   Improve shortness of breath with ADL's Yes    Intervention Provide education, individualized exercise plan and daily activity  instruction to help decrease symptoms of SOB with activities of daily living.    Expected Outcomes Short Term: Improve cardiorespiratory fitness to achieve a reduction of symptoms when performing ADLs;Long Term: Be able to perform more ADLs without symptoms or delay the onset of symptoms    Hypertension Yes   Medication to take if it gets too low or if it is too high   Intervention Provide education on lifestyle modifcations including regular physical activity/exercise, weight management, moderate sodium restriction and increased consumption of fresh fruit, vegetables, and low fat dairy, alcohol moderation, and smoking cessation.;Monitor prescription use compliance.    Expected Outcomes Short Term: Continued assessment and intervention until BP is < 140/45mm HG in hypertensive participants. < 130/90mm HG in hypertensive participants with diabetes, heart failure or chronic kidney disease.;Long Term: Maintenance of blood pressure at goal levels.    Lipids Yes    Intervention Provide education and support for participant on nutrition & aerobic/resistive exercise along with prescribed medications to achieve LDL 70mg , HDL >40mg .    Expected Outcomes Short Term: Participant states understanding of desired cholesterol values and is compliant with medications prescribed. Participant is following exercise prescription and nutrition guidelines.;Long Term: Cholesterol controlled with medications as prescribed, with individualized exercise RX and with personalized nutrition plan. Value goals: LDL < 70mg , HDL > 40 mg.          Education:Diabetes - Individual verbal and written instruction to review signs/symptoms of diabetes, desired ranges of glucose level fasting, after meals and with exercise. Acknowledge that pre and post exercise glucose checks will be done for 3 sessions at entry of program.   Know Your Numbers and Heart Failure: - Group verbal and visual instruction to discuss disease  risk factors for  cardiac and pulmonary disease and treatment options.  Reviews associated critical values for Overweight/Obesity, Hypertension, Cholesterol, and Diabetes.  Discusses basics of heart failure: signs/symptoms and treatments.  Introduces Heart Failure Zone chart for action plan for heart failure.  Written material given at graduation.   Core Components/Risk Factors/Patient Goals Review:    Core Components/Risk Factors/Patient Goals at Discharge (Final Review):    ITP Comments:  ITP Comments     Row Name 01/18/24 1055 01/19/24 1417 01/26/24 0927 02/08/24 1209 02/08/24 1248   ITP Comments Initial phone call completed. Diagnosis can be found in [facility] 3/4. EP Orientation scheduled for Thursday 3/20 at 9:30. Completed and gym orientation. Initial ITP created and sent for review to Dr. Faud Aleskerov, Medical Director. First full day of exercise!  Patient was oriented to gym and equipment including functions, settings, policies, and procedures.  Patient's individual exercise prescription and treatment plan were reviewed.  All starting workloads were established based on the results of the 6 minute walk test done at initial orientation visit.  The plan for exercise progression was also introduced and progression will be customized based on patient's performance and goals. 30 Day review completed. Medical Director ITP review done, changes made as directed, and signed approval by Medical Director.    new to program Patient called to inform staff that he would like to be placed on a hold for one month due to a new medication complications and poor health at this time. He is hopeful that he will be able to return after that time frame. He will call staff back in a few weeks to share is decision to return or not.    Row Name 03/07/24 1346 03/14/24 1047 04/04/24 1025 04/25/24 1111     ITP Comments 30 Day review completed. Medical Director ITP review done, changes made as directed, and signed approval by Medical  Director. Called patient to check in. He states he had his port removed last week and has to wait for incision to heal before returning to the program. Plan to call next Wednesday (5/21) to see what his doctor suggests from his appt on the 20th. 30 Day review completed. Medical Director ITP review done, changes made as directed, and signed approval by Medical Director. Tim called to discuss returning to the program. He is still experiencing other medical issues at this time and it was decided it would be best to discharge from the program. He completed 3 of 36 sessions.       Comments: Discharge ITP

## 2024-04-26 ENCOUNTER — Encounter

## 2024-05-01 ENCOUNTER — Encounter

## 2024-05-03 ENCOUNTER — Encounter

## 2024-05-07 ENCOUNTER — Other Ambulatory Visit (INDEPENDENT_AMBULATORY_CARE_PROVIDER_SITE_OTHER): Payer: Self-pay | Admitting: Vascular Surgery

## 2024-05-07 DIAGNOSIS — I82B11 Acute embolism and thrombosis of right subclavian vein: Secondary | ICD-10-CM

## 2024-05-08 ENCOUNTER — Other Ambulatory Visit (INDEPENDENT_AMBULATORY_CARE_PROVIDER_SITE_OTHER)

## 2024-05-08 ENCOUNTER — Ambulatory Visit (INDEPENDENT_AMBULATORY_CARE_PROVIDER_SITE_OTHER): Admitting: Nurse Practitioner

## 2024-05-08 ENCOUNTER — Encounter (INDEPENDENT_AMBULATORY_CARE_PROVIDER_SITE_OTHER): Payer: Self-pay | Admitting: Nurse Practitioner

## 2024-05-08 ENCOUNTER — Encounter

## 2024-05-08 VITALS — BP 142/84 | HR 93 | Ht 72.0 in | Wt 157.4 lb

## 2024-05-08 DIAGNOSIS — I82B11 Acute embolism and thrombosis of right subclavian vein: Secondary | ICD-10-CM

## 2024-05-08 DIAGNOSIS — I10 Essential (primary) hypertension: Secondary | ICD-10-CM

## 2024-05-08 NOTE — Progress Notes (Signed)
 Subjective:    Patient ID: Paul Carpenter, male    DOB: [birth year], 68 y.o.   MRN: [id number] Chief Complaint  Patient presents with  . Follow up with Marea Selinda RAMAN, MD (Vascular Surgery) in 3 wee    The patient returns to the office for followup and review status post angiogram with intervention on 04/16/2024.    PROCEDURE: 1.   Ultrasound guidance for vascular access to right basilic vein 2.   Catheter placement into superior vena cava and inferior vena cava from right basilic vein 3.   Right upper extremity venogram and superior venacavogram 4.   Percutaneous transluminal angioplasty of right subclavian vein with 6 and 8 mm diameter angioplasty balloons 5.   Stent placement to the right subclavian vein with 10 mm diameter by 8 cm length life star stent   The patient notes improvement in the upper extremity symptoms.  His largest complaint is of pain in his upper arm.  He notes it feels as if there is a biting sensation at times.  He denies any significant swelling of the upper extremity however.  Previously noted prominent varicose veins are not currently an issue.  There have been no significant changes to the patient's overall health care.  No documented history of amaurosis fugax or recent TIA symptoms. There are no recent neurological changes noted. No documented history of DVT, PE or superficial thrombophlebitis. The patient denies recent episodes of angina or shortness of breath.   Today noninvasive studies show that the stent placed the right subclavian vein is widely patent.  No evidence of deep vein thrombus in the right upper extremity.  However there is a segment of basilic vein in the area the patient complains about discomfort where there appears to be a superficial thrombophlebitis.    Review of Systems     Objective:   Physical Exam  BP (!) 142/84   Pulse 93   Ht 6' (1.829 m)   Wt 157 lb 6 oz (71.4 kg)   BMI 21.34 kg/m   Past Medical History:   Diagnosis Date  . Anginal pain (HCC)   . Anxiety   . Asthma   . Chest pain   . CHF (congestive heart failure) (HCC)   . Chicken pox   . Complication of anesthesia    o2 dropped after neck fusion  . COPD (chronic obstructive pulmonary disease) (HCC)   . Coronary artery disease   . Cough    chronic  clear phlegm  . Dysrhythmia    palpitations  . GERD (gastroesophageal reflux disease)    h/o reflux/ hoarsness  . Hematochezia   . Hemorrhoids   . History of chickenpox   . History of colon polyps   . History of Helicobacter pylori infection   . Hoarseness   . Hypertension   . Lung cancer (HCC) 05/2016   Chemo + rad tx's.   . Migraines   . OSA (obstructive sleep apnea)    has CPAP but does not use  . Personal history of tobacco use, presenting hazards to health 03/05/2016  . Pneumonia    5/17  . Raynaud disease   . Raynaud disease   . Raynaud's disease   . Rotator cuff tear    on right  . Shortness of breath dyspnea   . Sleep apnea   . Ulcer (traumatic) of oral mucosa     Social History   Socioeconomic History  . Marital status: Married    Spouse name:  Not on file  . Number of children: 3  . Years of education: Not on file  . Highest education level: Not on file  Occupational History  . Occupation: Holiday representative Work  Tobacco Use  . Smoking status: Every Day    Current packs/day: 1.50    Average packs/day: 3.0 packs/day for 52.5 years (156.3 ttl pk-yrs)    Types: Cigarettes    Start date: 1973    Passive exposure: Current  . Smokeless tobacco: Never  Vaping Use  . Vaping status: Never Used  Substance and Sexual Activity  . Alcohol use: Not Currently    Alcohol/week: 2.0 standard drinks of alcohol    Types: 2 Standard drinks or equivalent per week    Comment: moderate  . Drug use: No  . Sexual activity: Not on file  Other Topics Concern  . Not on file  Social History Narrative  . Not on file   Social Drivers of Health   Financial Resource Strain: Low  Risk  (02/08/2024)   Received from [facility]   Overall Financial Resource Strain (CARDIA)   . Difficulty of Paying Living Expenses: Not hard at all  Food Insecurity: No Food Insecurity (02/08/2024)   Received from [facility]   Hunger Vital Sign   . Within the past 12 months, you worried that your food would run out before you got the money to buy more.: Never true   . Within the past 12 months, the food you bought just didn't last and you didn't have money to get more.: Never true  Transportation Needs: No Transportation Needs (02/08/2024)   Received from [facility]   [facility] - Transportation   . In the past 12 months, has lack of transportation kept you from medical appointments or from getting medications?: No   . Lack of Transportation (Non-Medical): No  Physical Activity: Inactive (10/04/2023)   Received from [facility]   Exercise Vital Sign   . On average, how many days per week do you engage in moderate to strenuous exercise (like a brisk walk)?: 0 days   . On average, how many minutes do you engage in exercise at this level?: 0 min  Stress: No Stress Concern Present (10/04/2023)   Received from [facility] - Occupational Stress Questionnaire   . Feeling of Stress : Not at all  Social Connections: Socially Integrated (11/09/2023)   Social Connection and Isolation Panel   . Frequency of Communication with Friends and Family: More than three times a week   . Frequency of Social Gatherings with Friends and Family: More than three times a week   . Attends Religious Services: 1 to 4 times per year   . Active Member of Clubs or Organizations: No   . Attends Banker Meetings: 1 to 4 times per year   . Marital Status: Married  Catering manager Violence: Not At Risk (11/09/2023)   Humiliation, Afraid, Rape, and Kick questionnaire   . Fear of Current or Ex-Partner: No   .  Emotionally Abused: No   . Physically Abused: No   . Sexually Abused: No    Past Surgical History:  Procedure Laterality Date  . BACK SURGERY     cervical fusion x 2  . CARDIAC CATHETERIZATION    . CERVICAL DISCECTOMY     x 2  . COLONOSCOPY    . COLONOSCOPY N/A 07/25/2015   Procedure: COLONOSCOPY;  Surgeon: Gladis RAYMOND Mariner, MD;  Location: [facility] ENDOSCOPY;  Service: Endoscopy;  Laterality: N/A;  . COLONOSCOPY WITH PROPOFOL  N/A 10/04/2017   Procedure: COLONOSCOPY WITH PROPOFOL ;  Surgeon: Mariner Gladis RAYMOND, MD;  Location: [facility] ENDOSCOPY;  Service: Endoscopy;  Laterality: N/A;  . COLONOSCOPY WITH PROPOFOL  N/A 07/10/2020   Procedure: COLONOSCOPY WITH PROPOFOL ;  Surgeon: Toledo, Ladell POUR, MD;  Location: [facility] ENDOSCOPY;  Service: Gastroenterology;  Laterality: N/A;  . ELECTROMAGNETIC NAVIGATION BROCHOSCOPY Left 06/28/2016   Procedure: ELECTROMAGNETIC NAVIGATION BRONCHOSCOPY;  Surgeon: Jorie Cha, MD;  Location: [facility] ORS;  Service: Cardiopulmonary;  Laterality: Left;  . ENDOBRONCHIAL ULTRASOUND N/A 04/11/2018   Procedure: ENDOBRONCHIAL ULTRASOUND;  Surgeon: Isaiah Scrivener, MD;  Location: [facility] ORS;  Service: Cardiopulmonary;  Laterality: N/A;  . ESOPHAGOGASTRODUODENOSCOPY N/A 07/25/2015   Procedure: ESOPHAGOGASTRODUODENOSCOPY (EGD);  Surgeon: Gladis RAYMOND Mariner, MD;  Location: [facility] ENDOSCOPY;  Service: Endoscopy;  Laterality: N/A;  . ESOPHAGOGASTRODUODENOSCOPY (EGD) WITH PROPOFOL  N/A 07/10/2020   Procedure: ESOPHAGOGASTRODUODENOSCOPY (EGD) WITH PROPOFOL ;  Surgeon: Toledo, Ladell POUR, MD;  Location: [facility] ENDOSCOPY;  Service: Gastroenterology;  Laterality: N/A;  . NASAL SINUS SURGERY     x 2   . PORTA CATH INSERTION N/A 04/24/2018   Procedure: PORTA CATH INSERTION;  Surgeon: Marea Selinda RAMAN, MD;  Location: [facility] INVASIVE CV LAB;  Service: Cardiovascular;  Laterality: N/A;  . PORTA CATH REMOVAL N/A 03/08/2024   Procedure: PORTA CATH REMOVAL;  Surgeon: Marea Selinda RAMAN, MD;  Location: [facility] INVASIVE CV LAB;   Service: Cardiovascular;  Laterality: N/A;  . rotator cuff surgery Right    07/2016  . SEPTOPLASTY    . SKIN GRAFT    . UPPER EXTREMITY VENOGRAPHY Right 04/16/2024   Procedure: UPPER EXTREMITY VENOGRAPHY;  Surgeon: Marea Selinda RAMAN, MD;  Location: [facility] INVASIVE CV LAB;  Service: Cardiovascular;  Laterality: Right;    Family History  Problem Relation Age of Onset  . Heart disease Father   . Prostate cancer Father   . Heart disease Paternal Grandmother   . Heart attack Maternal Grandfather 52  . Kidney cancer Neg Hx   . Bladder Cancer Neg Hx   . Other Neg Hx        pituitary abnormality    Allergies  Allergen Reactions  . Lisinopril Rash  . Varenicline  Rash       Latest Ref Rng & Units 11/12/2023    5:57 AM 11/09/2023    5:55 AM 11/08/2023    5:10 PM  CBC  WBC 4.0 - 10.5 K/uL 8.7  7.5  9.7   Hemoglobin 13.0 - 17.0 g/dL 86.7  86.6  85.6   Hematocrit 39.0 - 52.0 % 39.1  38.9  41.3   Platelets 150 - 400 K/uL 180  204  213       CMP     Component Value Date/Time   NA 137 01/16/2024 0927   NA 136 12/25/2012 0951   K 3.7 01/16/2024 0927   K 4.0 12/25/2012 0951   CL 99 01/16/2024 0927   CL 102 12/25/2012 0951   CO2 30 01/16/2024 0927   CO2 31 12/25/2012 0951   GLUCOSE 88 01/16/2024 0927   GLUCOSE 86 12/25/2012 0951   BUN 7 (L) 04/16/2024 1026   BUN 10 12/25/2012 0951   CREATININE 0.67 04/16/2024 1026   CREATININE 0.64 09/27/2023 1130   CREATININE 0.88 12/25/2012 0951   CALCIUM  8.7 (L) 01/16/2024 0927   CALCIUM  8.9 12/25/2012 0951   PROT 6.3 (L) 11/08/2023 1710   ALBUMIN 3.8 11/08/2023 1710  AST 19 11/08/2023 1710   AST 18 09/27/2023 1130   ALT 17 11/08/2023 1710   ALT 15 09/27/2023 1130   ALKPHOS 77 11/08/2023 1710   BILITOT 0.6 11/08/2023 1710   BILITOT 0.5 09/27/2023 1130   GFR 109.29 02/16/2018 1005   EGFR 111.0 07/09/2023 0838   GFRNONAA >60 04/16/2024 1026   GFRNONAA >60 09/27/2023 1130   GFRNONAA >60 12/25/2012 0951     No results found.      Assessment & Plan:   1. 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  This is likely caused by being accessed in this area for his procedure.  Based on that I would recommend he begin taking a daily baby aspirin as well as utilizing warm compresses to the area to help with the discomfort.  Will plan on routine follow-up in 3 months with right upper extremity DVT study.  2. Benign essential hypertension Continue antihypertensive medications as already ordered, these medications have been reviewed and there are no changes at this time.   Current Outpatient Medications on File Prior to Visit  Medication Sig Dispense Refill  . acetaminophen  (TYLENOL ) 500 MG tablet Take 500 mg by mouth daily as needed.    . albuterol  (PROVENTIL ) (2.5 MG/3ML) 0.083% nebulizer solution Take 3 mLs (2.5 mg total) by nebulization 4 (four) times daily as needed for wheezing or shortness of breath. 75 mL 11  . albuterol  (VENTOLIN  HFA) 108 (90 Base) MCG/ACT inhaler INHALE 2 PUFFS BY MOUTH EVERY 4 HOURS AS NEEDED FOR WHEEZE OR FOR SHORTNESS OF BREATH 6.7 each 2  . ammonium lactate (LAC-HYDRIN) 12 % lotion Apply topically.    . arformoterol  (BROVANA ) 15 MCG/2ML NEBU Take 2 mLs (15 mcg total) by nebulization 2 (two) times daily. Dx: J44.9 120 mL 11  . atorvastatin  (LIPITOR) 10 MG tablet Take 10 mg by mouth daily.    . azithromycin  (ZITHROMAX ) 500 MG tablet TAKE 1 TABLET (500 MG TOTAL) BY MOUTH DAILY. 30 tablet 6  . budesonide  (PULMICORT ) 0.5 MG/2ML nebulizer solution Take 2 mLs (0.5 mg total) by nebulization 2 (two) times daily. 360 mL 3  . clofazimine  50 mg CAPS capsule (for compassionate use) Take 100 mg by mouth daily with breakfast.    . clotrimazole  (MYCELEX ) 10 MG troche Take 10 mg by mouth 3 (three) times daily.    . cyanocobalamin (VITAMIN B12) 1000 MCG/ML injection Inject 1 mL (1,000 mcg total) into the  muscle monthly for 180 days    . diazepam  (VALIUM ) 5 MG tablet Take 5 mg by mouth at bedtime as needed.    . ethambutol  (MYAMBUTOL ) 400 MG tablet Take 3 tablets (1,200 mg total) by mouth daily. 90 tablet 6  . ferrous sulfate  325 (65 FE) MG tablet Take 325 mg by mouth at bedtime.    . Fluocinolone Acetonide 0.01 % OIL PLEASE SEE ATTACHED FOR DETAILED DIRECTIONS    . folic acid  (FOLVITE ) 1 MG tablet Take 1 mg by mouth daily.    . guaiFENesin  (MUCINEX ) 600 MG 12 hr tablet Take 1 tablet (600 mg total) by mouth 2 (two) times daily. 60 tablet 0  . hydrocortisone  (CORTEF ) 10 MG tablet Take 2 tablets in the morning and 1 tablet mid afternoon (2 to 4 PM) 90 tablet 0  . levothyroxine  (SYNTHROID ) 175 MCG tablet Take 175 mcg by mouth daily before breakfast.    . lidocaine -prilocaine  (EMLA ) cream APPLY  1 APPLICATION TOPICALLY AS NEEDED. 30 g 1  . magnesium  oxide (MAG-OX) 400 MG tablet Take 800 mg by mouth 2 (two) times daily.    . melatonin 3 MG TABS tablet Take 3 mg by mouth at bedtime as needed.    . metoprolol  succinate (TOPROL -XL) 25 MG 24 hr tablet Take 12.5-25 mg by mouth daily as needed (Acute High Blood Pressure).    . naproxen  sodium (ANAPROX  DS) 550 MG tablet Take 1 tablet (550 mg total) by mouth 2 (two) times daily as needed for moderate pain (pain score 4-6). Take with meals 30 tablet 1  . nicotine  (NICODERM CQ  - DOSED IN MG/24 HOURS) 21 mg/24hr patch Place 21 mg onto the skin daily.    . nitroGLYCERIN  (NITROSTAT ) 0.4 MG SL tablet Place under the tongue.    . OXYGEN Inhale 2 L into the lungs at bedtime.    . pantoprazole  (PROTONIX ) 40 MG tablet Take 1 tablet (40 mg total) by mouth 2 (two) times daily before meals    . promethazine -dextromethorphan  (PROMETHAZINE -DM) 6.25-15 MG/5ML syrup Take 5 mLs by mouth 4 (four) times daily as needed for cough. 180 mL 1  . sodium chloride  1 g tablet Take 5 tablets (5 g total) by mouth 3 (three) times daily with meals.    . tadalafil  (CIALIS ) 5 MG tablet Take 1  tablet (5 mg total) by mouth daily as needed for erectile dysfunction. 90 tablet 3  . YUPELRI  175 MCG/3ML nebulizer solution TAKE 3 MLS (175 MCG TOTAL) BY NEBULIZATION DAILY. DX: J44.9 90 mL 11  . amikacin  (AMIKIN ) 1 GM/4ML SOLN injection Inject into the muscle daily. (Patient not taking: Reported on 05/08/2024)    . cefdinir  (OMNICEF ) 300 MG capsule Take 1 capsule (300 mg total) by mouth 2 (two) times daily. (Patient not taking: Reported on 05/08/2024) 14 capsule 0  . chlorpheniramine-HYDROcodone  (TUSSIONEX) 10-8 MG/5ML Take 5 mLs by mouth every 12 (twelve) hours as needed for cough. (Patient not taking: Reported on 05/08/2024) 115 mL 0  . [DISCONTINUED] ipratropium-albuterol  (DUONEB) 0.5-2.5 (3) MG/3ML SOLN Take 3 mLs by nebulization every 6 (six) hours as needed. 360 mL 5  . [DISCONTINUED] levalbuterol  (XOPENEX  HFA) 45 MCG/ACT inhaler Inhale 1 puff into the lungs every 6 (six) hours as needed for wheezing. 1 each 5   Current Facility-Administered Medications on File Prior to Visit  Medication Dose Route Frequency Provider Last Rate Last Admin  . heparin  lock flush 100 unit/mL  500 Units Intravenous Once [name], Jessey J, MD        There are no Patient Instructions on file for this visit. No follow-ups on file.   Pualani Borah E Averil Digman, NP

## 2024-05-10 ENCOUNTER — Encounter

## 2024-05-10 ENCOUNTER — Encounter: Payer: Self-pay | Admitting: Pulmonary Disease

## 2024-05-10 ENCOUNTER — Ambulatory Visit: Admitting: Pulmonary Disease

## 2024-05-10 VITALS — BP 126/86 | HR 96 | Temp 98.0°F | Ht 72.0 in | Wt 156.8 lb

## 2024-05-10 DIAGNOSIS — F17210 Nicotine dependence, cigarettes, uncomplicated: Secondary | ICD-10-CM

## 2024-05-10 DIAGNOSIS — A310 Pulmonary mycobacterial infection: Secondary | ICD-10-CM | POA: Diagnosis not present

## 2024-05-10 DIAGNOSIS — G4736 Sleep related hypoventilation in conditions classified elsewhere: Secondary | ICD-10-CM

## 2024-05-10 DIAGNOSIS — J449 Chronic obstructive pulmonary disease, unspecified: Secondary | ICD-10-CM | POA: Diagnosis not present

## 2024-05-10 DIAGNOSIS — E2740 Unspecified adrenocortical insufficiency: Secondary | ICD-10-CM | POA: Diagnosis not present

## 2024-05-10 DIAGNOSIS — J439 Emphysema, unspecified: Secondary | ICD-10-CM

## 2024-05-10 NOTE — Progress Notes (Signed)
 Subjective:    Patient ID: Paul Carpenter, male    DOB: 01/04/1956, 68 y.o.   MRN: [id number]  Patient Care Team: Paul Manna, MD as PCP - General (Internal Medicine) Paul, Selinda RAMAN, MD as Referring Physician (Vascular Surgery) Paul Evalene PARAS, MD as Consulting Physician (Oncology) Paul Aran, MD as Referring Physician (Radiation Oncology) Paul Dedra CROME, MD as Consulting Physician (Pulmonary Disease) Paul Bodily, MD as Consulting Physician (Infectious Diseases) Paul Vinie LOISE, MD as Referring Physician (Pulmonary Disease)  Chief Complaint  Patient presents with   Follow-up    BACKGROUND/INTERVAL:Paul Carpenter is a very complex 68 year old current smoker (1.5 PPD, 156 PY) with a history as noted below, who presents for follow-up of a cavitary right upper lobe process in the setting of history of non-small cell carcinoma of the lung, Mycobacterium avium complex infection, stage IV COPD and chronic dyspnea/fatigue.  This is a scheduled visit.  He was last seen 20 Mar 2024, he is also followed at [facility] at the pulmonary specialty clinic for management of his resistant MAC.  He is up to 1.5 packs of cigarettes per day smoking.     HPI Discussed the use of AI scribe software for clinical note transcription with the patient, who gave verbal consent to proceed.  History of Present Illness   Paul Carpenter is a 68 year old male with COPD and mycobacterium avium who presents for management of his respiratory conditions.  He presents accompanied by his wife, Paul Carpenter.  He was recently taken off clofazimine  due to changes in his QT interval observed on recent EKGs. An echocardiogram performed before starting the medication in December and a follow-up last week showed changes, prompting the discontinuation of the drug.  His LVEF has dropped to 40%.  He continues on azithromycin  and Myambutol  for his mycobacterium avium complex (MAC) infection.  Paul Carpenter had to be discontinued  due to hemoptysis with the medication and as noted clofazimine  discontinued due to myocardial issues.  He has a history of COPD and ongoing tobacco use, smoking 1-1/2 packs per day. He experiences significant shortness of breath, particularly in the mornings when he gets up and removes his oxygen. He uses albuterol , Yupelri , Pulmicort , and Brovana  for his COPD management.  Consideration for Ohtuvayre  will be given IF he quits smoking.  He has had two surgeries, including port removal, and experienced pain and inflammation at the site. An ultrasound confirmed the stent in his vein is still in place. He uses baby aspirin and performs exercises for his arm.  He reports itching and skin irritation, possibly related to his medications, and has been using Lac-Hydrin lotion for relief.  He was instructed to use white Dove soap and Aveeno oatmeal lotion for skin itching relief.  He has been using nicotine  patches to aid in smoking cessation but experienced itching and skin reactions.  He remains on Cortef  for his adrenal insufficiency.  He uses supplemental nocturnal oxygen.  He had increased the flow to 3 L/min and was of advised to decrease to 2 L as previously prescribed.  He is also not using the oxygen consistently as prescribed.  Review of Systems A 10 point review of systems was performed and it is as noted above otherwise negative.   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   Port-A-Cath in place 01/17/2024   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   Chronic adrenal insufficiency (HCC) 11/09/2023   Acute on chronic hypoxic respiratory failure (HCC)  11/09/2023   COPD with acute exacerbation (HCC) 11/08/2023   Pulmonary cavitary lesion 11/08/2023   Chronic respiratory failure with hypoxia (HCC) 11/08/2023   Hyponatremia 09/11/2022   Generalized weakness 09/10/2022   Shortness of breath 02/03/2022   SIADH (syndrome of inappropriate  ADH production) (HCC) 09/22/2021   Overweight (BMI 25.0-29.9) 09/22/2021   Hypomagnesemia 09/21/2021   Elevated hemoglobin A1c 06/19/2020   Hypothyroidism, acquired 05/30/2019   Squamous cell lung cancer, right (HCC) 05/30/2019   HFrEF (heart failure with reduced ejection fraction) (HCC) 03/06/2019   Atherosclerosis 12/14/2018   Non-small cell lung cancer, right (HCC) 04/24/2018   Oral ulcer 02/16/2018   Pituitary disorder (HCC) 02/16/2018   Migraine headache 03/07/2017   HCAP (healthcare-associated pneumonia) 09/11/2016   COPD exacerbation (HCC) 09/11/2016   Chronic hyponatremia 09/11/2016   Leukocytosis 09/11/2016   Thrombocytopenia (HCC) 09/11/2016   Cigarette smoker 06/11/2016   Cervical radiculopathy 04/15/2016   Cervical disc disorder at C5-C6 level with radiculopathy 03/09/2016   Impingement syndrome of right shoulder 03/09/2016   Health care maintenance 09/29/2015   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   Benign essential hypertension 05/28/2015   Polycythemia 03/24/2015   Carotid artery disease (HCC) 12/12/2014   Disequilibrium 12/12/2014   Mixed hyperlipidemia 12/10/2014   Incomplete emptying of bladder 06/04/2014   Anxiety 05/18/2014   Chronic coronary artery disease 05/18/2014   Chronic headaches 05/18/2014   Acute shoulder pain 03/15/2014   Impingement syndrome of left shoulder 03/15/2014   Lung mass 12/06/2013   Kidney stone 11/24/2013   COPD (chronic obstructive pulmonary disease) (HCC) 04/24/2013   Tobacco use disorder 04/24/2013   Obstructive sleep apnea 04/24/2013   Benign localized prostatic hyperplasia with lower urinary tract symptoms (LUTS) 07/04/2012   Encounter for long-term current use of medication 07/04/2012   ED (erectile dysfunction) of organic origin 07/04/2012   Testicular hypofunction 07/04/2012    Social History   Tobacco Use   Smoking status: Every Day    Current packs/day: 1.50    Average packs/day: 3.0 packs/day for 52.5 years (156.3 ttl  pk-yrs)    Types: Cigarettes    Start date: 1973    Passive exposure: Current   Smokeless tobacco: Never  Substance Use Topics   Alcohol use: Not Currently    Alcohol/week: 2.0 standard drinks of alcohol    Types: 2 Standard drinks or equivalent per week    Comment: moderate    Allergies  Allergen Reactions   Lisinopril Rash   Varenicline  Rash    Current Meds  Medication Sig   acetaminophen  (TYLENOL ) 500 MG tablet Take 500 mg by mouth daily as needed.   albuterol  (PROVENTIL ) (2.5 MG/3ML) 0.083% nebulizer solution Take 3 mLs (2.5 mg total) by nebulization 4 (four) times daily as needed for wheezing or shortness of breath.   albuterol  (VENTOLIN  HFA) 108 (90 Base) MCG/ACT inhaler INHALE 2 PUFFS BY MOUTH EVERY 4 HOURS AS NEEDED FOR WHEEZE OR FOR SHORTNESS OF BREATH   ammonium lactate (LAC-HYDRIN) 12 % lotion Apply topically.   arformoterol  (BROVANA ) 15 MCG/2ML NEBU Take 2 mLs (15 mcg total) by nebulization 2 (two) times daily. Dx: J44.9   aspirin EC 81 MG tablet Take 81 mg by mouth daily. Swallow whole.   atorvastatin  (LIPITOR) 10 MG tablet Take 10 mg by mouth daily.   azithromycin  (ZITHROMAX ) 500 MG tablet TAKE 1 TABLET (500 MG TOTAL) BY MOUTH DAILY.   budesonide  (PULMICORT ) 0.5 MG/2ML nebulizer solution Take 2 mLs (0.5 mg total) by nebulization 2 (two) times daily.  chlorpheniramine-HYDROcodone  (TUSSIONEX) 10-8 MG/5ML Take 5 mLs by mouth every 12 (twelve) hours as needed for cough.   clotrimazole  (MYCELEX ) 10 MG troche Take 10 mg by mouth 3 (three) times daily.   cyanocobalamin (VITAMIN B12) 1000 MCG/ML injection Inject 1 mL (1,000 mcg total) into the muscle monthly for 180 days   diazepam  (VALIUM ) 5 MG tablet Take 5 mg by mouth at bedtime as needed.   ethambutol  (MYAMBUTOL ) 400 MG tablet Take 3 tablets (1,200 mg total) by mouth daily.   ferrous sulfate  325 (65 FE) MG tablet Take 325 mg by mouth at bedtime.   Fluocinolone Acetonide 0.01 % OIL PLEASE SEE ATTACHED FOR DETAILED  DIRECTIONS   folic acid  (FOLVITE ) 1 MG tablet Take 1 mg by mouth daily.   guaiFENesin  (MUCINEX ) 600 MG 12 hr tablet Take 1 tablet (600 mg total) by mouth 2 (two) times daily.   hydrocortisone  (CORTEF ) 10 MG tablet Take 2 tablets in the morning and 1 tablet mid afternoon (2 to 4 PM)   levothyroxine  (SYNTHROID ) 175 MCG tablet Take 175 mcg by mouth daily before breakfast.   lidocaine -prilocaine  (EMLA ) cream APPLY 1 APPLICATION TOPICALLY AS NEEDED.   magnesium  oxide (MAG-OX) 400 MG tablet Take 800 mg by mouth 2 (two) times daily.   melatonin 3 MG TABS tablet Take 3 mg by mouth at bedtime as needed.   metoprolol  succinate (TOPROL -XL) 25 MG 24 hr tablet Take 12.5-25 mg by mouth daily as needed (Acute High Blood Pressure).   naproxen  sodium (ANAPROX  DS) 550 MG tablet Take 1 tablet (550 mg total) by mouth 2 (two) times daily as needed for moderate pain (pain score 4-6). Take with meals   nicotine  (NICODERM CQ  - DOSED IN MG/24 HOURS) 21 mg/24hr patch Place 21 mg onto the skin daily.   nitroGLYCERIN  (NITROSTAT ) 0.4 MG SL tablet Place under the tongue.   OXYGEN Inhale 2 L into the lungs at bedtime.   pantoprazole  (PROTONIX ) 40 MG tablet Take 1 tablet (40 mg total) by mouth 2 (two) times daily before meals   promethazine -dextromethorphan  (PROMETHAZINE -DM) 6.25-15 MG/5ML syrup Take 5 mLs by mouth 4 (four) times daily as needed for cough.   sodium chloride  1 g tablet Take 5 tablets (5 g total) by mouth 3 (three) times daily with meals.   tadalafil  (CIALIS ) 5 MG tablet Take 1 tablet (5 mg total) by mouth daily as needed for erectile dysfunction.   YUPELRI  175 MCG/3ML nebulizer solution TAKE 3 MLS (175 MCG TOTAL) BY NEBULIZATION DAILY. DX: J44.9    Immunization History  Administered Date(s) Administered   Fluad Quad(high Dose 65+) 09/22/2020   Influenza Inj Mdck Quad Pf 09/06/2016, 10/02/2019, 09/09/2021, 09/03/2022   Influenza Split 08/05/2013, 07/23/2014, 08/18/2015   Influenza Whole 08/01/2012    Influenza,inj,Quad PF,6+ Mos 08/14/2018   Influenza-Unspecified 09/06/2016, 08/17/2017, 09/09/2021   PNEUMOCOCCAL CONJUGATE-20 02/08/2024   Zoster Recombinant(Shingrix) 06/19/2020        Objective:     BP 126/86 (BP Location: Left Arm, Patient Position: Sitting, Cuff Size: Normal)   Pulse 96   Temp 98 F (36.7 C) (Oral)   Ht 6' (1.829 m)   Wt 156 lb 12.8 oz (71.1 kg)   SpO2 99%   BMI 21.27 kg/m   SpO2: 99 %  GENERAL: Chronically ill-appearing man, mild tachypnea.  No conversational dyspnea.  Awake and alert.   HEAD: Normocephalic, atraumatic.  EYES: Pupils equal, round, reactive to light.  No scleral icterus.  MOUTH: Oral mucosa moist.  No thrush. NECK: Supple. No  thyromegaly. Trachea midline. No JVD.  No adenopathy. PULMONARY: Good air entry bilaterally. Amphoric sounds on the right upper lung zone.  Coarse, without rhonchi or wheezes noted today.   CARDIOVASCULAR: S1 and S2. Regular rate and rhythm.  No rubs, murmurs or gallops heard. ABDOMEN: Benign. MUSCULOSKELETAL: No joint deformity, no clubbing, no edema.  NEUROLOGIC: No overt focal deficit. Speech is fluent. SKIN: Intact,warm,dry. PSYCH: Depressed mood and flat affect.  Irascible at times.      Assessment & Plan:     ICD-10-CM   1. Stage 4 very severe COPD by GOLD classification (HCC)  J44.9 AMB referral to pulmonary rehabilitation    2. Mycobacterium avium complex (HCC) - resistant  A31.0     3. Nocturnal hypoxemia due to emphysema (HCC)  J43.9    G47.36     4. Chronic adrenal insufficiency (HCC)  E27.40      Orders Placed This Encounter  Procedures   AMB referral to pulmonary rehabilitation    Referral Priority:   Routine    Referral Type:   Consultation    Number of Visits Requested:   1   Discussion:    Chronic Obstructive Pulmonary Disease (COPD) COPD symptoms are primarily affecting him in the mornings, with significant dyspnea upon waking, potentially exacerbated by lying flat during sleep,  impairing lung expansion.He is not consistently using oxygen at night, which could strain his heart. - Refer to pulmonary rehabilitation - Advise smoking cessation - Recommend nocturnal oxygen use to reduce cardiac strain - Advise maintaining oxygen flow at 2 L/min to avoid CO2 retention - Suggest elevating head during sleep to improve lung expansion  Tobacco use disorder Ongoing tobacco use with a reduction to less than two packs per day. Previous nicotine  patch use resulted in skin irritation, possibly due to medication interactions. Alternative nicotine  replacement options are limited due to market availability. - Prescribe nicotine  patches, 21 mg, with instructions to apply in the morning and remove at bedtime - Recommend using white Dove soap and Aveeno oatmeal lotion to manage skin irritation  Mycobacterium Avium Complex (MAC) infection The MAC infection appears to be under control, with cultures showing a decrease in bacterial load. The lung cavity is not expected to reduce in size, but monitoring is necessary to ensure it does not enlarge. - Continue azithromycin  - CT scan of the chest scheduled for October 28 to monitor disease  History of non-small cell cancer of the lung CT scan of the chest scheduled for 28 to monitor disease.     Advised if symptoms do not improve or worsen, to please contact office for sooner follow up or seek emergency care.    I spent 40 minutes of dedicated to the care of this patient on the date of this encounter to include pre-visit review of records, face-to-face time with the patient discussing conditions above, post visit ordering of testing, clinical documentation with the electronic health record, making appropriate referrals as documented, and communicating necessary findings to members of the patients care team.     C. Leita Sanders, MD Advanced Bronchoscopy PCCM [facility]    *This note was generated using voice  recognition software/Dragon and/or AI transcription program.  Despite best efforts to proofread, errors can occur which can change the meaning. Any transcriptional errors that result from this process are unintentional and may not be fully corrected at the time of dictation.

## 2024-05-10 NOTE — Patient Instructions (Signed)
 VISIT SUMMARY:  Today, we discussed the management of your respiratory conditions, including COPD and mycobacterium avium infection. We reviewed your recent medication changes, your ongoing symptoms, and your current treatment plan. We also addressed your tobacco use and skin irritation issues.  YOUR PLAN:  -CHRONIC OBSTRUCTIVE PULMONARY DISEASE (COPD): COPD is a chronic lung disease that makes it hard to breathe. To help manage your symptoms, you are advised to attend pulmonary rehabilitation, quit smoking, use oxygen at night, keep your oxygen flow at 2 L/min, and elevate your head during sleep to improve lung expansion.  -TOBACCO USE DISORDER: Tobacco use disorder is a dependence on tobacco products. You are prescribed nicotine  patches (21 mg) to help you quit smoking. Apply the patch in the morning and remove it at bedtime. To manage skin irritation, use white Dove soap and Aveeno oatmeal lotion.  -MYCOBACTERIUM AVIUM COMPLEX (MAC) INFECTION: MAC infection is a type of bacterial infection that affects the lungs. Your infection is under control, and you should continue taking azithromycin . A CT scan is scheduled for October 28 to monitor your lung cavity.  INSTRUCTIONS:  Please follow up with pulmonary rehabilitation as advised. Continue using your prescribed medications and follow the recommendations for managing your COPD and tobacco use. Attend your scheduled CT scan on October 28 to monitor your lung cavity.

## 2024-05-11 ENCOUNTER — Other Ambulatory Visit: Payer: Self-pay | Admitting: Pulmonary Disease

## 2024-05-15 ENCOUNTER — Encounter

## 2024-05-17 ENCOUNTER — Encounter

## 2024-05-30 ENCOUNTER — Encounter: Attending: Pulmonary Disease

## 2024-05-30 ENCOUNTER — Ambulatory Visit: Payer: Self-pay | Admitting: Pulmonary Disease

## 2024-05-30 VITALS — Ht 71.0 in | Wt 159.9 lb

## 2024-05-30 DIAGNOSIS — J449 Chronic obstructive pulmonary disease, unspecified: Secondary | ICD-10-CM | POA: Diagnosis present

## 2024-05-30 LAB — ACID FAST CULTURE WITH REFLEXED SENSITIVITIES (MYCOBACTERIA): Acid Fast Culture: NEGATIVE

## 2024-05-30 NOTE — Patient Instructions (Signed)
 Patient Instructions  Patient Details  Name: Paul Carpenter MRN: [id number] Date of Birth: 03-19-1956 Referring Provider:  Tamea Dedra CROME, MD  Below are your personal goals for exercise, nutrition, and risk factors. Our goal is to help you stay on track towards obtaining and maintaining these goals. We will be discussing your progress on these goals with you throughout the program.  Initial Exercise Prescription:  Initial Exercise Prescription - 05/30/24 1500       Date of Initial Exercise RX and Referring Provider   Date 05/30/24    Referring Provider Dr. Dedra Tamea      Oxygen   Oxygen Continuous    Liters 0-2L    Maintain Oxygen Saturation 88% or higher      Recumbant Bike   Level 1    RPM 50    Watts 25    Minutes 15    METs 2.43      NuStep   Level 1    SPM 80    Minutes 15    METs 2.43      REL-XR   Level 1    Watts 25    Speed 50    Minutes 15    METs 2.43      Track   Laps 18    Minutes 15    METs 1.98      Intensity   THRR 40-80% of Max Heartrate 114-140    Ratings of Perceived Exertion 11-13    Perceived Dyspnea 0-4      Resistance Training   Training Prescription Yes    Weight 3lb    Reps 10-15          Exercise Goals: Frequency: Be able to perform aerobic exercise two to three times per week in program working toward 2-5 days per week of home exercise.  Intensity: Work with a perceived exertion of 11 (fairly light) - 15 (hard) while following your exercise prescription.  We will make changes to your prescription with you as you progress through the program.   Duration: Be able to do 30 to 45 minutes of continuous aerobic exercise in addition to a 5 minute warm-up and a 5 minute cool-down routine.   Nutrition Goals: Your personal nutrition goals will be established when you do your nutrition analysis with the dietician.  The following are general nutrition guidelines to follow: Cholesterol < 200mg /day Sodium <  1500mg /day Fiber: Men over 50 yrs - 30 grams per day  Personal Goals:  Personal Goals and Risk Factors at Admission - 05/30/24 1526       Core Components/Risk Factors/Patient Goals on Admission   Tobacco Cessation Yes    Number of packs per day <2 ppd    Intervention Assist the participant in steps to quit. Provide individualized education and counseling about committing to Tobacco Cessation, relapse prevention, and pharmacological support that can be provided by physician.;Education officer, environmental, assist with locating and accessing local/national Quit Smoking programs, and support quit date choice.    Expected Outcomes Short Term: Will demonstrate readiness to quit, by selecting a quit date.;Short Term: Will quit all tobacco product use, adhering to prevention of relapse plan.;Long Term: Complete abstinence from all tobacco products for at least 12 months from quit date.    Improve shortness of breath with ADL's Yes    Intervention Provide education, individualized exercise plan and daily activity instruction to help decrease symptoms of SOB with activities of daily living.    Expected Outcomes Short Term:  Improve cardiorespiratory fitness to achieve a reduction of symptoms when performing ADLs;Long Term: Be able to perform more ADLs without symptoms or delay the onset of symptoms    Hypertension Yes    Intervention Provide education on lifestyle modifcations including regular physical activity/exercise, weight management, moderate sodium restriction and increased consumption of fresh fruit, vegetables, and low fat dairy, alcohol moderation, and smoking cessation.;Monitor prescription use compliance.    Expected Outcomes Short Term: Continued assessment and intervention until BP is < 140/15mm HG in hypertensive participants. < 130/7mm HG in hypertensive participants with diabetes, heart failure or chronic kidney disease.;Long Term: Maintenance of blood pressure at goal levels.    Lipids Yes     Intervention Provide education and support for participant on nutrition & aerobic/resistive exercise along with prescribed medications to achieve LDL 70mg , HDL >40mg .    Expected Outcomes Short Term: Participant states understanding of desired cholesterol values and is compliant with medications prescribed. Participant is following exercise prescription and nutrition guidelines.;Long Term: Cholesterol controlled with medications as prescribed, with individualized exercise RX and with personalized nutrition plan. Value goals: LDL < 70mg , HDL > 40 mg.          Tobacco Use Initial Evaluation: Social History   Tobacco Use  Smoking Status Every Day   Current packs/day: 1.50   Average packs/day: 3.0 packs/day for 52.6 years (156.4 ttl pk-yrs)   Types: Cigarettes   Start date: 1973   Passive exposure: Current  Smokeless Tobacco Never    Exercise Goals and Review:  Exercise Goals     Row Name 01/19/24 1424 05/30/24 1523           Exercise Goals   Increase Physical Activity Yes Yes      Intervention Provide advice, education, support and counseling about physical activity/exercise needs.;Develop an individualized exercise prescription for aerobic and resistive training based on initial evaluation findings, risk stratification, comorbidities and participant's personal goals. Provide advice, education, support and counseling about physical activity/exercise needs.;Develop an individualized exercise prescription for aerobic and resistive training based on initial evaluation findings, risk stratification, comorbidities and participant's personal goals.      Expected Outcomes Short Term: Attend rehab on a regular basis to increase amount of physical activity.;Long Term: Exercising regularly at least 3-5 days a week.;Long Term: Add in home exercise to make exercise part of routine and to increase amount of physical activity. Short Term: Attend rehab on a regular basis to increase amount of  physical activity.;Long Term: Exercising regularly at least 3-5 days a week.;Long Term: Add in home exercise to make exercise part of routine and to increase amount of physical activity.      Increase Strength and Stamina Yes Yes      Intervention Provide advice, education, support and counseling about physical activity/exercise needs.;Develop an individualized exercise prescription for aerobic and resistive training based on initial evaluation findings, risk stratification, comorbidities and participant's personal goals. Provide advice, education, support and counseling about physical activity/exercise needs.;Develop an individualized exercise prescription for aerobic and resistive training based on initial evaluation findings, risk stratification, comorbidities and participant's personal goals.      Expected Outcomes Short Term: Increase workloads from initial exercise prescription for resistance, speed, and METs.;Short Term: Perform resistance training exercises routinely during rehab and add in resistance training at home;Long Term: Improve cardiorespiratory fitness, muscular endurance and strength as measured by increased METs and functional capacity ( ) Short Term: Increase workloads from initial exercise prescription for resistance, speed, and METs.;Short Term: Perform resistance training exercises  routinely during rehab and add in resistance training at home;Long Term: Improve cardiorespiratory fitness, muscular endurance and strength as measured by increased METs and functional capacity ( )      Able to understand and use rate of perceived exertion (RPE) scale Yes Yes      Intervention Provide education and explanation on how to use RPE scale Provide education and explanation on how to use RPE scale      Expected Outcomes Short Term: Able to use RPE daily in rehab to express subjective intensity level;Long Term:  Able to use RPE to guide intensity level when exercising independently Short Term:  Able to use RPE daily in rehab to express subjective intensity level;Long Term:  Able to use RPE to guide intensity level when exercising independently      Able to understand and use Dyspnea scale Yes Yes      Intervention Provide education and explanation on how to use Dyspnea scale Provide education and explanation on how to use Dyspnea scale      Expected Outcomes Short Term: Able to use Dyspnea scale daily in rehab to express subjective sense of shortness of breath during exertion;Long Term: Able to use Dyspnea scale to guide intensity level when exercising independently Short Term: Able to use Dyspnea scale daily in rehab to express subjective sense of shortness of breath during exertion;Long Term: Able to use Dyspnea scale to guide intensity level when exercising independently      Knowledge and understanding of Target Heart Rate Range (THRR) Yes Yes      Intervention Provide education and explanation of THRR including how the numbers were predicted and where they are located for reference Provide education and explanation of THRR including how the numbers were predicted and where they are located for reference      Expected Outcomes Short Term: Able to state/look up THRR;Short Term: Able to use daily as guideline for intensity in rehab;Long Term: Able to use THRR to govern intensity when exercising independently Short Term: Able to state/look up THRR;Short Term: Able to use daily as guideline for intensity in rehab;Long Term: Able to use THRR to govern intensity when exercising independently      Able to check pulse independently Yes Yes      Intervention Provide education and demonstration on how to check pulse in carotid and radial arteries.;Review the importance of being able to check your own pulse for safety during independent exercise Provide education and demonstration on how to check pulse in carotid and radial arteries.;Review the importance of being able to check your own pulse for safety  during independent exercise      Expected Outcomes Long Term: Able to check pulse independently and accurately;Short Term: Able to explain why pulse checking is important during independent exercise Long Term: Able to check pulse independently and accurately;Short Term: Able to explain why pulse checking is important during independent exercise      Understanding of Exercise Prescription Yes Yes      Intervention Provide education, explanation, and written materials on patient's individual exercise prescription Provide education, explanation, and written materials on patient's individual exercise prescription      Expected Outcomes Short Term: Able to explain program exercise prescription;Long Term: Able to explain home exercise prescription to exercise independently Short Term: Able to explain program exercise prescription;Long Term: Able to explain home exercise prescription to exercise independently         Copy of goals given to participant.

## 2024-05-30 NOTE — Progress Notes (Signed)
 Pulmonary Individual Treatment Plan  Patient Details  Name: Paul Carpenter MRN: [id number] Date of Birth: 1956/07/28 Referring Provider:   Conrad Ports Pulmonary Rehab from 05/30/2024 in [facility] Cardiac and Pulmonary Rehab  Referring Provider Dr. Dedra Sanders    Initial Encounter Date:  Flowsheet Row Pulmonary Rehab from 05/30/2024 in [facility] Cardiac and Pulmonary Rehab  Date 05/30/24    Visit Diagnosis: Chronic obstructive pulmonary disease, unspecified COPD type (HCC)  Patient's Home Medications on Admission:  Current Outpatient Medications:    acetaminophen  (TYLENOL ) 500 MG tablet, Take 500 mg by mouth daily as needed., Disp: , Rfl:    albuterol  (PROVENTIL ) (2.5 MG/3ML) 0.083% nebulizer solution, Take 3 mLs (2.5 mg total) by nebulization 4 (four) times daily as needed for wheezing or shortness of breath., Disp: 75 mL, Rfl: 11   albuterol  (VENTOLIN  HFA) 108 (90 Base) MCG/ACT inhaler, INHALE 2 PUFFS BY MOUTH EVERY 4 HOURS AS NEEDED FOR WHEEZE OR FOR SHORTNESS OF BREATH, Disp: 18 each, Rfl: 2   amikacin  (AMIKIN ) 1 GM/4ML SOLN injection, Inject into the muscle daily. (Patient not taking: Reported on 05/10/2024), Disp: , Rfl:    ammonium lactate (LAC-HYDRIN) 12 % lotion, Apply topically., Disp: , Rfl:    arformoterol  (BROVANA ) 15 MCG/2ML NEBU, Take 2 mLs (15 mcg total) by nebulization 2 (two) times daily. Dx: J44.9, Disp: 120 mL, Rfl: 11   aspirin EC 81 MG tablet, Take 81 mg by mouth daily. Swallow whole., Disp: , Rfl:    atorvastatin  (LIPITOR) 10 MG tablet, Take 10 mg by mouth daily., Disp: , Rfl:    azithromycin  (ZITHROMAX ) 500 MG tablet, TAKE 1 TABLET (500 MG TOTAL) BY MOUTH DAILY., Disp: 30 tablet, Rfl: 6   budesonide  (PULMICORT ) 0.5 MG/2ML nebulizer solution, Take 2 mLs (0.5 mg total) by nebulization 2 (two) times daily., Disp: 360 mL, Rfl: 3   cefdinir  (OMNICEF ) 300 MG capsule, Take 1 capsule (300 mg total) by mouth 2 (two) times daily. (Patient not taking: Reported on 05/10/2024), Disp:  14 capsule, Rfl: 0   chlorpheniramine-HYDROcodone  (TUSSIONEX) 10-8 MG/5ML, Take 5 mLs by mouth every 12 (twelve) hours as needed for cough., Disp: 115 mL, Rfl: 0   clofazimine  50 mg CAPS capsule (for compassionate use), Take 100 mg by mouth daily with breakfast. (Patient not taking: Reported on 05/10/2024), Disp: , Rfl:    clotrimazole  (MYCELEX ) 10 MG troche, Take 10 mg by mouth 3 (three) times daily., Disp: , Rfl:    cyanocobalamin (VITAMIN B12) 1000 MCG/ML injection, Inject 1 mL (1,000 mcg total) into the muscle monthly for 180 days, Disp: , Rfl:    diazepam  (VALIUM ) 5 MG tablet, Take 5 mg by mouth at bedtime as needed., Disp: , Rfl:    ethambutol  (MYAMBUTOL ) 400 MG tablet, Take 3 tablets (1,200 mg total) by mouth daily., Disp: 90 tablet, Rfl: 6   ferrous sulfate  325 (65 FE) MG tablet, Take 325 mg by mouth at bedtime., Disp: , Rfl:    Fluocinolone Acetonide 0.01 % OIL, PLEASE SEE ATTACHED FOR DETAILED DIRECTIONS, Disp: , Rfl:    folic acid  (FOLVITE ) 1 MG tablet, Take 1 mg by mouth daily., Disp: , Rfl:    guaiFENesin  (MUCINEX ) 600 MG 12 hr tablet, Take 1 tablet (600 mg total) by mouth 2 (two) times daily., Disp: 60 tablet, Rfl: 0   hydrocortisone  (CORTEF ) 10 MG tablet, Take 2 tablets in the morning and 1 tablet mid afternoon (2 to 4 PM), Disp: 90 tablet, Rfl: 0   levothyroxine  (SYNTHROID ) 175 MCG tablet,  Take 175 mcg by mouth daily before breakfast., Disp: , Rfl:    lidocaine -prilocaine  (EMLA ) cream, APPLY 1 APPLICATION TOPICALLY AS NEEDED., Disp: 30 g, Rfl: 1   magnesium  oxide (MAG-OX) 400 MG tablet, Take 800 mg by mouth 2 (two) times daily., Disp: , Rfl:    melatonin 3 MG TABS tablet, Take 3 mg by mouth at bedtime as needed., Disp: , Rfl:    metoprolol  succinate (TOPROL -XL) 25 MG 24 hr tablet, Take 12.5-25 mg by mouth daily as needed (Acute High Blood Pressure)., Disp: , Rfl:    naproxen  sodium (ANAPROX  DS) 550 MG tablet, Take 1 tablet (550 mg total) by mouth 2 (two) times daily as needed for  moderate pain (pain score 4-6). Take with meals, Disp: 30 tablet, Rfl: 1   nicotine  (NICODERM CQ  - DOSED IN MG/24 HOURS) 21 mg/24hr patch, Place 21 mg onto the skin daily., Disp: , Rfl:    nitroGLYCERIN  (NITROSTAT ) 0.4 MG SL tablet, Place under the tongue., Disp: , Rfl:    OXYGEN, Inhale 2 L into the lungs at bedtime., Disp: , Rfl:    pantoprazole  (PROTONIX ) 40 MG tablet, Take 1 tablet (40 mg total) by mouth 2 (two) times daily before meals, Disp: , Rfl:    promethazine -dextromethorphan  (PROMETHAZINE -DM) 6.25-15 MG/5ML syrup, Take 5 mLs by mouth 4 (four) times daily as needed for cough., Disp: 180 mL, Rfl: 1   sodium chloride  1 g tablet, Take 5 tablets (5 g total) by mouth 3 (three) times daily with meals., Disp: , Rfl:    tadalafil  (CIALIS ) 5 MG tablet, Take 1 tablet (5 mg total) by mouth daily as needed for erectile dysfunction., Disp: 90 tablet, Rfl: 3   YUPELRI  175 MCG/3ML nebulizer solution, TAKE 3 MLS (175 MCG TOTAL) BY NEBULIZATION DAILY. DX: J44.9, Disp: 90 mL, Rfl: 11 No current facility-administered medications for this visit.  Facility-Administered Medications Ordered in Other Visits:    heparin  lock flush 100 unit/mL, 500 Units, Intravenous, Once, [name], Evalene PARAS, MD  Past Medical History: Past Medical History:  Diagnosis Date   Anginal pain (HCC)    Anxiety    Asthma    Chest pain    CHF (congestive heart failure) (HCC)    Chicken pox    Complication of anesthesia    o2 dropped after neck fusion   COPD (chronic obstructive pulmonary disease) (HCC)    Coronary artery disease    Cough    chronic  clear phlegm   Dysrhythmia    palpitations   GERD (gastroesophageal reflux disease)    h/o reflux/ hoarsness   Hematochezia    Hemorrhoids    History of chickenpox    History of colon polyps    History of Helicobacter pylori infection    Hoarseness    Hypertension    Lung cancer (HCC) 05/2016   Chemo + rad tx's.    Migraines    OSA (obstructive sleep apnea)    has  CPAP but does not use   Personal history of tobacco use, presenting hazards to health 03/05/2016   Pneumonia    5/17   Raynaud disease    Raynaud disease    Raynaud's disease    Rotator cuff tear    on right   Shortness of breath dyspnea    Sleep apnea    Ulcer (traumatic) of oral mucosa     Tobacco Use: Social History   Tobacco Use  Smoking Status Every Day   Current packs/day: 1.50   Average packs/day:  3.0 packs/day for 52.6 years (156.4 ttl pk-yrs)   Types: Cigarettes   Start date: 1973   Passive exposure: Current  Smokeless Tobacco Never    Labs: Review Flowsheet        No data to display           Pulmonary Assessment Scores:  Pulmonary Assessment Scores     Row Name 01/26/24 0926 05/30/24 1525       ADL UCSD   ADL Phase Entry --    SOB Score total 111 --    Rest 4 --    Walk 5 --    Stairs 5 --    Bath 5 --    Dress 5 --    Shop 5 --      CAT Score   CAT Score 38 34      mMRC Score   mMRC Score -- 3       UCSD: Self-administered rating of dyspnea associated with activities of daily living (ADLs) 6-point scale (0 = not at all to 5 = maximal or unable to do because of breathlessness)  Scoring Scores range from 0 to 120.  Minimally important difference is 5 units  CAT: CAT can identify the health impairment of COPD patients and is better correlated with disease progression.  CAT has a scoring range of zero to 40. The CAT score is classified into four groups of low (less than 10), medium (10 - 20), high (21-30) and very high (31-40) based on the impact level of disease on health status. A CAT score over 10 suggests significant symptoms.  A worsening CAT score could be explained by an exacerbation, poor medication adherence, poor inhaler technique, or progression of COPD or comorbid conditions.  CAT MCID is 2 points  mMRC: mMRC (Modified Medical Research Council) Dyspnea Scale is used to assess the degree of baseline functional disability in  patients of respiratory disease due to dyspnea. No minimal important difference is established. A decrease in score of 1 point or greater is considered a positive change.   Pulmonary Function Assessment:   Exercise Target Goals: Exercise Program Goal: Individual exercise prescription set using results from initial 6 min walk test and THRR while considering  patient's activity barriers and safety.   Exercise Prescription Goal: Initial exercise prescription builds to 30-45 minutes a day of aerobic activity, 2-3 days per week.  Home exercise guidelines will be given to patient during program as part of exercise prescription that the participant will acknowledge.  Education: Aerobic Exercise: - Group verbal and visual presentation on the components of exercise prescription. Introduces F.I.T.T principle from ACSM for exercise prescriptions.  Reviews F.I.T.T. principles of aerobic exercise including progression. Written material given at graduation.   Education: Resistance Exercise: - Group verbal and visual presentation on the components of exercise prescription. Introduces F.I.T.T principle from ACSM for exercise prescriptions  Reviews F.I.T.T. principles of resistance exercise including progression. Written material given at graduation.    Education: Exercise & Equipment Safety: - Individual verbal instruction and demonstration of equipment use and safety with use of the equipment. Flowsheet Row Pulmonary Rehab from 05/30/2024 in [facility] Cardiac and Pulmonary Rehab  Date 05/30/24  Educator [facility]  Instruction Review Code 1- Verbalizes Understanding    Education: Exercise Physiology & General Exercise Guidelines: - Group verbal and written instruction with models to review the exercise physiology of the cardiovascular system and associated critical values. Provides general exercise guidelines with specific guidelines to those with heart or lung disease.  Education: Flexibility, Balance,  Mind/Body Relaxation: - Group verbal and visual presentation with interactive activity on the components of exercise prescription. Introduces F.I.T.T principle from ACSM for exercise prescriptions. Reviews F.I.T.T. principles of flexibility and balance exercise training including progression. Also discusses the mind body connection.  Reviews various relaxation techniques to help reduce and manage stress (i.e. Deep breathing, progressive muscle relaxation, and visualization). Balance handout provided to take home. Written material given at graduation.   Activity Barriers & Risk Stratification:  Activity Barriers & Cardiac Risk Stratification - 05/30/24 1519       Activity Barriers & Cardiac Risk Stratification   Activity Barriers Deconditioning;Muscular Weakness;Shortness of Breath;Other (comment)    Comments Shoulder mobility concerns          6 Minute Walk:  6 Minute Walk     Row Name 01/19/24 1417 05/30/24 1516       6 Minute Walk   Phase Initial Initial    Distance 625 feet 670 feet    Walk Time 6 minutes 6 minutes    # of Rest Breaks 0 0    MPH 1.18 1.27    METS 2.3 2.43    RPE 15 13    Perceived Dyspnea  3 3    VO2 Peak 7.97 8.52    Symptoms No Yes (comment)    Comments -- dizziness, SOB    Resting HR 93 bpm 88 bpm    Resting BP 112/58 114/62    Resting Oxygen Saturation  94 % 93 %    Exercise Oxygen Saturation  during 6 min walk 92 % 89 %    Max Ex. HR 105 bpm 99 bpm    Max Ex. BP 124/64 134/64    2 Minute Post BP 114/62 126/68      Interval HR   1 Minute HR 101 99    2 Minute HR 102 94    3 Minute HR 104 91    4 Minute HR 105 95    5 Minute HR 104 97    6 Minute HR 97 99    2 Minute Post HR 97 87    Interval Heart Rate? Yes Yes      Interval Oxygen   Interval Oxygen? Yes Yes    Baseline Oxygen Saturation % 94 % 93 %    1 Minute Oxygen Saturation % 94 % 90 %    1 Minute Liters of Oxygen 0 L 0 L    2 Minute Oxygen Saturation % 94 % 93 %    2 Minute  Liters of Oxygen 0 L 0 L    3 Minute Oxygen Saturation % 94 % 88 %    3 Minute Liters of Oxygen 0 L 0 L    4 Minute Oxygen Saturation % 92 % 89 %    4 Minute Liters of Oxygen 0 L 0 L    5 Minute Oxygen Saturation % 92 % 90 %    5 Minute Liters of Oxygen 0 L 0 L    6 Minute Oxygen Saturation % 96 % 93 %    6 Minute Liters of Oxygen 0 L 0 L    2 Minute Post Oxygen Saturation % 95 % 95 %    2 Minute Post Liters of Oxygen 0 L 0 L      Oxygen Initial Assessment:  Oxygen Initial Assessment - 05/30/24 1524       Home Oxygen   Sleep Oxygen Prescription Continuous  Liters per minute 3    Home Exercise Oxygen Prescription Continuous    Liters per minute 3    Home Resting Oxygen Prescription None    Compliance with Home Oxygen Use Yes      Initial 6 min Walk   Oxygen Used None      Program Oxygen Prescription   Program Oxygen Prescription None      Intervention   Short Term Goals To learn and demonstrate proper pursed lip breathing techniques or other breathing techniques.     Long  Term Goals Exhibits proper breathing techniques, such as pursed lip breathing or other method taught during program session          Oxygen Re-Evaluation:  Oxygen Re-Evaluation     Row Name 01/26/24 0928             Program Oxygen Prescription   Program Oxygen Prescription None         Home Oxygen   Home Oxygen Device Home Concentrator       Sleep Oxygen Prescription Continuous       Liters per minute 3       Home Exercise Oxygen Prescription Continuous       Liters per minute 3       Home Resting Oxygen Prescription None       Compliance with Home Oxygen Use Yes         Goals/Expected Outcomes   Short Term Goals To learn and demonstrate proper pursed lip breathing techniques or other breathing techniques.        Long  Term Goals Exhibits proper breathing techniques, such as pursed lip breathing or other method taught during program session       Comments Reviewed PLB technique with  pt.  Talked about how it works and it's importance in maintaining their exercise saturations.       Goals/Expected Outcomes Short: Become more profiecient at using PLB. Long: Become independent at using PLB.          Oxygen Discharge (Final Oxygen Re-Evaluation):  Oxygen Re-Evaluation - 01/26/24 0928       Program Oxygen Prescription   Program Oxygen Prescription None      Home Oxygen   Home Oxygen Device Home Concentrator    Sleep Oxygen Prescription Continuous    Liters per minute 3    Home Exercise Oxygen Prescription Continuous    Liters per minute 3    Home Resting Oxygen Prescription None    Compliance with Home Oxygen Use Yes      Goals/Expected Outcomes   Short Term Goals To learn and demonstrate proper pursed lip breathing techniques or other breathing techniques.     Long  Term Goals Exhibits proper breathing techniques, such as pursed lip breathing or other method taught during program session    Comments Reviewed PLB technique with pt.  Talked about how it works and it's importance in maintaining their exercise saturations.    Goals/Expected Outcomes Short: Become more profiecient at using PLB. Long: Become independent at using PLB.          Initial Exercise Prescription:  Initial Exercise Prescription - 05/30/24 1500       Date of Initial Exercise RX and Referring Provider   Date 05/30/24    Referring Provider Dr. Dedra Sanders      Oxygen   Oxygen Continuous    Liters 0-2L    Maintain Oxygen Saturation 88% or higher      Recumbant Bike  Level 1    RPM 50    Watts 25    Minutes 15    METs 2.43      NuStep   Level 1    SPM 80    Minutes 15    METs 2.43      REL-XR   Level 1    Watts 25    Speed 50    Minutes 15    METs 2.43      Track   Laps 18    Minutes 15    METs 1.98      Intensity   THRR 40-80% of Max Heartrate 114-140    Ratings of Perceived Exertion 11-13    Perceived Dyspnea 0-4      Resistance Training   Training  Prescription Yes    Weight 3lb    Reps 10-15          Perform Capillary Blood Glucose checks as needed.  Exercise Prescription Changes:   Exercise Prescription Changes     Row Name 01/19/24 1400 02/02/24 1100 02/16/24 1200 05/30/24 1500       Response to Exercise   Blood Pressure (Admit) 112/58 128/84 122/60 114/62    Blood Pressure (Exercise) 124/64 138/58 130/62 134/64    Blood Pressure (Exit) 114/62 112/62 122/68 126/68    Heart Rate (Admit) 93 bpm 92 bpm 104 bpm 88 bpm    Heart Rate (Exercise) 105 bpm 93 bpm 104 bpm 99 bpm    Heart Rate (Exit) 97 bpm 88 bpm 99 bpm 81 bpm    Oxygen Saturation (Admit) 94 % 98 % 95 % 93 %    Oxygen Saturation (Exercise) 92 % 95 % 96 % 89 %    Oxygen Saturation (Exit) 95 % 95 % 97 % 95 %    Rating of Perceived Exertion (Exercise) 15 13 14 13     Perceived Dyspnea (Exercise) 3 3 0 3    Symptoms none none none Dizziness, SOB    Comments results First full day of exercise -- results    Duration -- Progress to 30 minutes of  aerobic without signs/symptoms of physical distress Progress to 30 minutes of  aerobic without signs/symptoms of physical distress --    Intensity -- THRR unchanged THRR unchanged --      Progression   Progression -- Continue to progress workloads to maintain intensity without signs/symptoms of physical distress. Continue to progress workloads to maintain intensity without signs/symptoms of physical distress. --    Average METs -- 2.08 2.27 --      Resistance Training   Training Prescription -- Yes Yes --    Weight -- 4 lb 4 lb --    Reps -- 10-15 10-15 --      Interval Training   Interval Training -- No No --      Oxygen   Oxygen -- Continuous Continuous --    Liters -- 0  As needed 0 --      Recumbant Bike   Level -- 1 1 --    Watts -- 25 25 --    Minutes -- 15 15 --    METs -- 3.05 3.04 --      NuStep   Level -- -- 2 --    Minutes -- -- 15 --    METs -- -- 1.5 --      T5 Nustep   Level -- 1 --  --    Minutes -- 15 -- --  METs -- 1.1 -- --      Oxygen   Maintain Oxygen Saturation -- 88% or higher 88% or higher --       Exercise Comments:   Exercise Comments     Row Name 01/26/24 0927           Exercise Comments First full day of exercise!  Patient was oriented to gym and equipment including functions, settings, policies, and procedures.  Patient's individual exercise prescription and treatment plan were reviewed.  All starting workloads were established based on the results of the 6 minute walk test done at initial orientation visit.  The plan for exercise progression was also introduced and progression will be customized based on patient's performance and goals.          Exercise Goals and Review:   Exercise Goals     Row Name 01/19/24 1424 05/30/24 1523           Exercise Goals   Increase Physical Activity Yes Yes      Intervention Provide advice, education, support and counseling about physical activity/exercise needs.;Develop an individualized exercise prescription for aerobic and resistive training based on initial evaluation findings, risk stratification, comorbidities and participant's personal goals. Provide advice, education, support and counseling about physical activity/exercise needs.;Develop an individualized exercise prescription for aerobic and resistive training based on initial evaluation findings, risk stratification, comorbidities and participant's personal goals.      Expected Outcomes Short Term: Attend rehab on a regular basis to increase amount of physical activity.;Long Term: Exercising regularly at least 3-5 days a week.;Long Term: Add in home exercise to make exercise part of routine and to increase amount of physical activity. Short Term: Attend rehab on a regular basis to increase amount of physical activity.;Long Term: Exercising regularly at least 3-5 days a week.;Long Term: Add in home exercise to make exercise part of routine and to  increase amount of physical activity.      Increase Strength and Stamina Yes Yes      Intervention Provide advice, education, support and counseling about physical activity/exercise needs.;Develop an individualized exercise prescription for aerobic and resistive training based on initial evaluation findings, risk stratification, comorbidities and participant's personal goals. Provide advice, education, support and counseling about physical activity/exercise needs.;Develop an individualized exercise prescription for aerobic and resistive training based on initial evaluation findings, risk stratification, comorbidities and participant's personal goals.      Expected Outcomes Short Term: Increase workloads from initial exercise prescription for resistance, speed, and METs.;Short Term: Perform resistance training exercises routinely during rehab and add in resistance training at home;Long Term: Improve cardiorespiratory fitness, muscular endurance and strength as measured by increased METs and functional capacity ( ) Short Term: Increase workloads from initial exercise prescription for resistance, speed, and METs.;Short Term: Perform resistance training exercises routinely during rehab and add in resistance training at home;Long Term: Improve cardiorespiratory fitness, muscular endurance and strength as measured by increased METs and functional capacity ( )      Able to understand and use rate of perceived exertion (RPE) scale Yes Yes      Intervention Provide education and explanation on how to use RPE scale Provide education and explanation on how to use RPE scale      Expected Outcomes Short Term: Able to use RPE daily in rehab to express subjective intensity level;Long Term:  Able to use RPE to guide intensity level when exercising independently Short Term: Able to use RPE daily in rehab to express subjective intensity level;Long Term:  Able to use RPE to guide intensity level when exercising  independently      Able to understand and use Dyspnea scale Yes Yes      Intervention Provide education and explanation on how to use Dyspnea scale Provide education and explanation on how to use Dyspnea scale      Expected Outcomes Short Term: Able to use Dyspnea scale daily in rehab to express subjective sense of shortness of breath during exertion;Long Term: Able to use Dyspnea scale to guide intensity level when exercising independently Short Term: Able to use Dyspnea scale daily in rehab to express subjective sense of shortness of breath during exertion;Long Term: Able to use Dyspnea scale to guide intensity level when exercising independently      Knowledge and understanding of Target Heart Rate Range (THRR) Yes Yes      Intervention Provide education and explanation of THRR including how the numbers were predicted and where they are located for reference Provide education and explanation of THRR including how the numbers were predicted and where they are located for reference      Expected Outcomes Short Term: Able to state/look up THRR;Short Term: Able to use daily as guideline for intensity in rehab;Long Term: Able to use THRR to govern intensity when exercising independently Short Term: Able to state/look up THRR;Short Term: Able to use daily as guideline for intensity in rehab;Long Term: Able to use THRR to govern intensity when exercising independently      Able to check pulse independently Yes Yes      Intervention Provide education and demonstration on how to check pulse in carotid and radial arteries.;Review the importance of being able to check your own pulse for safety during independent exercise Provide education and demonstration on how to check pulse in carotid and radial arteries.;Review the importance of being able to check your own pulse for safety during independent exercise      Expected Outcomes Long Term: Able to check pulse independently and accurately;Short Term: Able to explain  why pulse checking is important during independent exercise Long Term: Able to check pulse independently and accurately;Short Term: Able to explain why pulse checking is important during independent exercise      Understanding of Exercise Prescription Yes Yes      Intervention Provide education, explanation, and written materials on patient's individual exercise prescription Provide education, explanation, and written materials on patient's individual exercise prescription      Expected Outcomes Short Term: Able to explain program exercise prescription;Long Term: Able to explain home exercise prescription to exercise independently Short Term: Able to explain program exercise prescription;Long Term: Able to explain home exercise prescription to exercise independently         Exercise Goals Re-Evaluation :  Exercise Goals Re-Evaluation     Row Name 01/26/24 9071 02/02/24 1111 02/16/24 1239 02/29/24 0832 03/15/24 1400     Exercise Goal Re-Evaluation   Exercise Goals Review Able to understand and use rate of perceived exertion (RPE) scale;Able to understand and use Dyspnea scale;Knowledge and understanding of Target Heart Rate Range (THRR);Understanding of Exercise Prescription Increase Physical Activity;Increase Strength and Stamina;Understanding of Exercise Prescription Increase Physical Activity;Increase Strength and Stamina;Understanding of Exercise Prescription Increase Physical Activity;Increase Strength and Stamina;Understanding of Exercise Prescription Increase Physical Activity;Increase Strength and Stamina;Understanding of Exercise Prescription   Comments Reviewed RPE and dyspnea scale, THR and program prescription with pt today.  Pt voiced understanding and was given a copy of goals to take home. Velinda is off to a  good start in the program. He was able to work at level 1 on both the recumbent bike and T5 nustep during his first session in rehab. He also did well with 4 lb hand weights for  resistance training. We will continue to monitor his progress in the program. Velinda is doing well in the program. he was only able to attend one session during this review period. During his one session he was able to increase to level 2 on the T4 nustep. He was subsequently placed on medical hold, and we will continue to stay in contact. We will continue to monitor his progress upon his return. Tim in on a medical hold due to new medication complications. He last attended on 02/02/2024 and we will stay in contact to determine when able to return. We will monitor his progress upon his return. Tim in on a medical hold due to new medication complications. He last attended on 02/02/2024 and we will stay in contact to determine when able to return. We will monitor his progress upon his return.   Expected Outcomes Short: Use RPE daily to regulate intensity. Long: Follow program prescription in THR. Short: Continue to follow current exercise prescription. Long: Continue exercise to improve strength and stamina. Short: Continue to follow current exercise prescription. Long: Continue exercise to improve strength and stamina. Short: Return to rehab when able. Long: Continue to exercise to improve strength and stamina Short: Return to rehab when able. Long: Continue to exercise to improve strength and stamina    Row Name 03/30/24 1135 04/12/24 1619 04/26/24 1606         Exercise Goal Re-Evaluation   Exercise Goals Review Increase Physical Activity;Increase Strength and Stamina;Understanding of Exercise Prescription Increase Physical Activity;Increase Strength and Stamina;Understanding of Exercise Prescription Increase Physical Activity;Increase Strength and Stamina;Understanding of Exercise Prescription     Comments Velinda continues to be on a medical hold due to new medication complications. He last attended on 02/02/2024 and we will stay in contact to determine when able to return. We will monitor his progress upon his  return. Tim continues to be on a medical hold due to new medication complications. He last attended on 02/02/2024 and we will stay in contact to determine when able to return. We will monitor his progress upon his return. Tim continues to be on a medical hold due to new medication complications. He last attended on 02/02/2024 and we will stay in contact to determine when able to return. We will monitor his progress upon his return.     Expected Outcomes Short: Return to rehab when able. Long: Continue to exercise to improve strength and stamina Short: Return to rehab when able. Long: Continue to exercise to improve strength and stamina Short: Return to rehab when able. Long: Continue to exercise to improve strength and stamina        Discharge Exercise Prescription (Final Exercise Prescription Changes):  Exercise Prescription Changes - 05/30/24 1500       Response to Exercise   Blood Pressure (Admit) 114/62    Blood Pressure (Exercise) 134/64    Blood Pressure (Exit) 126/68    Heart Rate (Admit) 88 bpm    Heart Rate (Exercise) 99 bpm    Heart Rate (Exit) 81 bpm    Oxygen Saturation (Admit) 93 %    Oxygen Saturation (Exercise) 89 %    Oxygen Saturation (Exit) 95 %    Rating of Perceived Exertion (Exercise) 13    Perceived Dyspnea (Exercise) 3  Symptoms Dizziness, SOB    Comments results          Nutrition:  Target Goals: Understanding of nutrition guidelines, daily intake of sodium 1500mg , cholesterol 200mg , calories 30% from fat and 7% or less from saturated fats, daily to have 5 or more servings of fruits and vegetables.  Education: All About Nutrition: -Group instruction provided by verbal, written material, interactive activities, discussions, models, and posters to present general guidelines for heart healthy nutrition including fat, fiber, MyPlate, the role of sodium in heart healthy nutrition, utilization of the nutrition label, and utilization of this knowledge for meal  planning. Follow up email sent as well. Written material given at graduation. Flowsheet Row Pulmonary Rehab from 01/26/2024 in [facility] Cardiac and Pulmonary Rehab  Education need identified 01/26/24    Biometrics:  Pre Biometrics - 05/30/24 1524       Pre Biometrics   Height 5' 11 (1.803 m)    Weight 159 lb 14.4 oz (72.5 kg)    Waist Circumference 34 inches    Hip Circumference 36.5 inches    Waist to Hip Ratio 0.93 %    BMI (Calculated) 22.31    Single Leg Stand 0 seconds           Nutrition Therapy Plan and Nutrition Goals:   Nutrition Assessments:  MEDIFICTS Score Key: >=70 Need to make dietary changes  40-70 Heart Healthy Diet <= 40 Therapeutic Level Cholesterol Diet  Flowsheet Row Pulmonary Rehab from 01/18/2024 in [facility] Cardiac and Pulmonary Rehab  Picture Your Plate Total Score on Admission 39   Picture Your Plate Scores: <59 Unhealthy dietary pattern with much room for improvement. 41-50 Dietary pattern unlikely to meet recommendations for good health and room for improvement. 51-60 More healthful dietary pattern, with some room for improvement.  >60 Healthy dietary pattern, although there may be some specific behaviors that could be improved.   Nutrition Goals Re-Evaluation:   Nutrition Goals Discharge (Final Nutrition Goals Re-Evaluation):   Psychosocial: Target Goals: Acknowledge presence or absence of significant depression and/or stress, maximize coping skills, provide positive support system. Participant is able to verbalize types and ability to use techniques and skills needed for reducing stress and depression.   Education: Stress, Anxiety, and Depression - Group verbal and visual presentation to define topics covered.  Reviews how body is impacted by stress, anxiety, and depression.  Also discusses healthy ways to reduce stress and to treat/manage anxiety and depression.  Written material given at graduation.   Education: Sleep Hygiene -Provides  group verbal and written instruction about how sleep can affect your health.  Define sleep hygiene, discuss sleep cycles and impact of sleep habits. Review good sleep hygiene tips.    Initial Review & Psychosocial Screening:  Initial Psych Review & Screening - 01/18/24 1039       Initial Review   Current issues with Current Stress Concerns;Current Sleep Concerns    Source of Stress Concerns Chronic Illness;Unable to participate in former interests or hobbies;Unable to perform yard/household activities      Family Dynamics   Good Support System? Yes   family     Barriers   Psychosocial barriers to participate in program The patient should benefit from training in stress management and relaxation.      Screening Interventions   Interventions Encouraged to exercise;Provide feedback about the scores to participant;To provide support and resources with identified psychosocial needs    Expected Outcomes Short Term goal: Utilizing psychosocial counselor, staff and physician to  assist with identification of specific Stressors or current issues interfering with healing process. Setting desired goal for each stressor or current issue identified.;Long Term Goal: Stressors or current issues are controlled or eliminated.;Short Term goal: Identification and review with participant of any Quality of Life or Depression concerns found by scoring the questionnaire.;Long Term goal: The participant improves quality of Life and PHQ9 Scores as seen by post scores and/or verbalization of changes          Quality of Life Scores:  Scores of 19 and below usually indicate a poorer quality of life in these areas.  A difference of  2-3 points is a clinically meaningful difference.  A difference of 2-3 points in the total score of the Quality of Life Index has been associated with significant improvement in overall quality of life, self-image, physical symptoms, and general health in studies assessing change in quality  of life.  PHQ-9: Review Flowsheet  More data exists      01/19/2024 03/10/2023 10/05/2022 07/22/2022 07/20/2022  Depression screen PHQ 2/9  Decreased Interest 2 0 0 1 0  Down, Depressed, Hopeless 0 1 1 1  0  PHQ - 2 Score 2 1 1 2  0  Altered sleeping 2 - - 1 -  Tired, decreased energy 3 - - 2 -  Change in appetite 2 - - 3 -  Feeling bad or failure about yourself  2 - - 0 -  Trouble concentrating 1 - - 0 -  Moving slowly or fidgety/restless 3 - - 0 -  Suicidal thoughts 0 - - 0 -  PHQ-9 Score 15 - - 8 -  Difficult doing work/chores Extremely dIfficult - - Somewhat difficult -   Interpretation of Total Score  Total Score Depression Severity:  1-4 = Minimal depression, 5-9 = Mild depression, 10-14 = Moderate depression, 15-19 = Moderately severe depression, 20-27 = Severe depression   Psychosocial Evaluation and Intervention:  Psychosocial Evaluation - 01/18/24 1057       Psychosocial Evaluation & Interventions   Interventions Encouraged to exercise with the program and follow exercise prescription;Stress management education;Relaxation education    Comments Mr. Platte is coming to pulmonary rehab with COPD. 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 He said he takes 41 pills and nebulizer treatments that wear him out. He is tired and unable to do what he used to do. He would like to get out to his job sites more, but is limited due to breathing and appointments. He currently is dealing with an infection around is port site which his doctors are managing. He wears oxygen as needed, but sometimes still struggles to keep it up during the night which alters his sleep. His wife and children are his main support system. He enjoys gardening when he has the time    Expected Outcomes Short: attend pulmonary rehab for educaiton and exercise Long: develop and maintain positive  self care habits    Continue Psychosocial Services  Follow up required by staff          Psychosocial Re-Evaluation:   Psychosocial Discharge (Final Psychosocial Re-Evaluation):   Education: Education Goals: Education classes will be provided on a weekly basis, covering required topics. Participant will state understanding/return demonstration of topics presented.  Learning Barriers/Preferences:  Learning Barriers/Preferences - 01/18/24 1038       Learning Barriers/Preferences  Learning Barriers None    Learning Preferences None          General Pulmonary Education Topics:  Infection Prevention: - Provides verbal and written material to individual with discussion of infection control including proper hand washing and proper equipment cleaning during exercise session. Flowsheet Row Pulmonary Rehab from 05/30/2024 in [facility] Cardiac and Pulmonary Rehab  Date 05/30/24  Educator [facility]  Instruction Review Code 1- Verbalizes Understanding    Falls Prevention: - Provides verbal and written material to individual with discussion of falls prevention and safety. Flowsheet Row Pulmonary Rehab from 05/30/2024 in [facility] Cardiac and Pulmonary Rehab  Date 05/30/24  Educator [facility]  Instruction Review Code 1- Verbalizes Understanding    Chronic Lung Disease Review: - Group verbal instruction with posters, models, PowerPoint presentations and videos,  to review new updates, new respiratory medications, new advancements in procedures and treatments. Providing information on websites and 800 numbers for continued self-education. Includes information about supplement oxygen, available portable oxygen systems, continuous and intermittent flow rates, oxygen safety, concentrators, and Medicare reimbursement for oxygen. Explanation of Pulmonary Drugs, including class, frequency, complications, importance of spacers, rinsing mouth after steroid MDI's, and proper cleaning methods for nebulizers. Review of  basic lung anatomy and physiology related to function, structure, and complications of lung disease. Review of risk factors. Discussion about methods for diagnosing sleep apnea and types of masks and machines for OSA. Includes a review of the use of types of environmental controls: home humidity, furnaces, filters, dust mite/pet prevention, HEPA vacuums. Discussion about weather changes, air quality and the benefits of nasal washing. Instruction on Warning signs, infection symptoms, calling MD promptly, preventive modes, and value of vaccinations. Review of effective airway clearance, coughing and/or vibration techniques. Emphasizing that all should Create an Action Plan. Written material given at graduation. Flowsheet Row Pulmonary Rehab from 01/26/2024 in [facility] Cardiac and Pulmonary Rehab  Education need identified 01/26/24    AED/CPR: - Group verbal and written instruction with the use of models to demonstrate the basic use of the AED with the basic ABC's of resuscitation.    Anatomy and Cardiac Procedures: - Group verbal and visual presentation and models provide information about basic cardiac anatomy and function. Reviews the testing methods done to diagnose heart disease and the outcomes of the test results. Describes the treatment choices: Medical Management, Angioplasty, or Coronary Bypass Surgery for treating various heart conditions including Myocardial Infarction, Angina, Valve Disease, and Cardiac Arrhythmias.  Written material given at graduation.   Medication Safety: - Group verbal and visual instruction to review commonly prescribed medications for heart and lung disease. Reviews the medication, class of the drug, and side effects. Includes the steps to properly store meds and maintain the prescription regimen.  Written material given at graduation.   Other: -Provides group and verbal instruction on various topics (see comments)   Knowledge Questionnaire Score:  Knowledge  Questionnaire Score - 01/26/24 0936       Knowledge Questionnaire Score   Pre Score 15/18           Core Components/Risk Factors/Patient Goals at Admission:  Personal Goals and Risk Factors at Admission - 05/30/24 1526       Core Components/Risk Factors/Patient Goals on Admission   Tobacco Cessation Yes    Number of packs per day <2 ppd    Intervention Assist the participant in steps to quit. Provide individualized education and counseling about committing to Tobacco Cessation, relapse prevention, and pharmacological support that can be provided by physician.;Offer  self-teaching materials, assist with locating and accessing local/national Quit Smoking programs, and support quit date choice.    Expected Outcomes Short Term: Will demonstrate readiness to quit, by selecting a quit date.;Short Term: Will quit all tobacco product use, adhering to prevention of relapse plan.;Long Term: Complete abstinence from all tobacco products for at least 12 months from quit date.    Improve shortness of breath with ADL's Yes    Intervention Provide education, individualized exercise plan and daily activity instruction to help decrease symptoms of SOB with activities of daily living.    Expected Outcomes Short Term: Improve cardiorespiratory fitness to achieve a reduction of symptoms when performing ADLs;Long Term: Be able to perform more ADLs without symptoms or delay the onset of symptoms    Hypertension Yes    Intervention Provide education on lifestyle modifcations including regular physical activity/exercise, weight management, moderate sodium restriction and increased consumption of fresh fruit, vegetables, and low fat dairy, alcohol moderation, and smoking cessation.;Monitor prescription use compliance.    Expected Outcomes Short Term: Continued assessment and intervention until BP is < 140/35mm HG in hypertensive participants. < 130/46mm HG in hypertensive participants with diabetes, heart failure or  chronic kidney disease.;Long Term: Maintenance of blood pressure at goal levels.    Lipids Yes    Intervention Provide education and support for participant on nutrition & aerobic/resistive exercise along with prescribed medications to achieve LDL 70mg , HDL >40mg .    Expected Outcomes Short Term: Participant states understanding of desired cholesterol values and is compliant with medications prescribed. Participant is following exercise prescription and nutrition guidelines.;Long Term: Cholesterol controlled with medications as prescribed, with individualized exercise RX and with personalized nutrition plan. Value goals: LDL < 70mg , HDL > 40 mg.          Education:Diabetes - Individual verbal and written instruction to review signs/symptoms of diabetes, desired ranges of glucose level fasting, after meals and with exercise. Acknowledge that pre and post exercise glucose checks will be done for 3 sessions at entry of program.   Know Your Numbers and Heart Failure: - Group verbal and visual instruction to discuss disease risk factors for cardiac and pulmonary disease and treatment options.  Reviews associated critical values for Overweight/Obesity, Hypertension, Cholesterol, and Diabetes.  Discusses basics of heart failure: signs/symptoms and treatments.  Introduces Heart Failure Zone chart for action plan for heart failure.  Written material given at graduation.   Core Components/Risk Factors/Patient Goals Review:    Core Components/Risk Factors/Patient Goals at Discharge (Final Review):    ITP Comments:  ITP Comments     Row Name 01/18/24 1055 01/19/24 1417 01/26/24 0927 04/04/24 1025 04/25/24 1111   ITP Comments Initial phone call completed. Diagnosis can be found in [facility] 3/4. EP Orientation scheduled for Thursday 3/20 at 9:30. Completed and gym orientation. Initial ITP created and sent for review to Dr. Faud Aleskerov, Medical Director. First full day of exercise!  Patient was  oriented to gym and equipment including functions, settings, policies, and procedures.  Patient's individual exercise prescription and treatment plan were reviewed.  All starting workloads were established based on the results of the 6 minute walk test done at initial orientation visit.  The plan for exercise progression was also introduced and progression will be customized based on patient's performance and goals. 30 Day review completed. Medical Director ITP review done, changes made as directed, and signed approval by Medical Director. Tim called to discuss returning to the program. He is still experiencing other medical issues  at this time and it was decided it would be best to discharge from the program. He completed 3 of 36 sessions.    Row Name 05/30/24 1516           ITP Comments Completed and gym orientation for pulmonary rehab. Initial ITP created and sent for review to Dr. Faud Aleskerov, Medical Director.          Comments: Initial ITP

## 2024-06-07 ENCOUNTER — Ambulatory Visit: Attending: Infectious Diseases | Admitting: Infectious Diseases

## 2024-06-07 ENCOUNTER — Encounter: Payer: Self-pay | Admitting: Infectious Diseases

## 2024-06-07 VITALS — BP 136/80 | HR 102 | Temp 98.4°F | Ht 71.0 in | Wt 160.0 lb

## 2024-06-07 DIAGNOSIS — J441 Chronic obstructive pulmonary disease with (acute) exacerbation: Secondary | ICD-10-CM | POA: Diagnosis not present

## 2024-06-07 DIAGNOSIS — F17210 Nicotine dependence, cigarettes, uncomplicated: Secondary | ICD-10-CM | POA: Diagnosis not present

## 2024-06-07 DIAGNOSIS — H9109 Ototoxic hearing loss, unspecified ear: Secondary | ICD-10-CM | POA: Diagnosis not present

## 2024-06-07 DIAGNOSIS — Z7982 Long term (current) use of aspirin: Secondary | ICD-10-CM | POA: Diagnosis not present

## 2024-06-07 DIAGNOSIS — T365X5A Adverse effect of aminoglycosides, initial encounter: Secondary | ICD-10-CM

## 2024-06-07 DIAGNOSIS — E222 Syndrome of inappropriate secretion of antidiuretic hormone: Secondary | ICD-10-CM | POA: Insufficient documentation

## 2024-06-07 DIAGNOSIS — Z792 Long term (current) use of antibiotics: Secondary | ICD-10-CM | POA: Insufficient documentation

## 2024-06-07 DIAGNOSIS — Z85118 Personal history of other malignant neoplasm of bronchus and lung: Secondary | ICD-10-CM | POA: Diagnosis not present

## 2024-06-07 DIAGNOSIS — E039 Hypothyroidism, unspecified: Secondary | ICD-10-CM | POA: Diagnosis not present

## 2024-06-07 DIAGNOSIS — A310 Pulmonary mycobacterial infection: Secondary | ICD-10-CM | POA: Diagnosis not present

## 2024-06-07 DIAGNOSIS — J449 Chronic obstructive pulmonary disease, unspecified: Secondary | ICD-10-CM | POA: Diagnosis not present

## 2024-06-07 DIAGNOSIS — E871 Hypo-osmolality and hyponatremia: Secondary | ICD-10-CM

## 2024-06-07 DIAGNOSIS — E271 Primary adrenocortical insufficiency: Secondary | ICD-10-CM | POA: Diagnosis not present

## 2024-06-07 DIAGNOSIS — Z923 Personal history of irradiation: Secondary | ICD-10-CM | POA: Diagnosis not present

## 2024-06-07 NOTE — Progress Notes (Signed)
 NAME: Paul Carpenter  DOB: August 18, 1956  MRN: [id number]  Date/Time: 06/07/2024 9:36 AM  Subjective:  Paul Carpenter is a 68 year old male with MAC infection who presents for follow-up. Consented to the use of AI scribe  Since the last visit on May 8th, he had his port removed due to swelling and pain in his arms, which was causing venous obstruction. On the same day, a balloon angioplasty and stent placement were performed on the right subclavian vein. Despite these interventions, he continues to experience stinging and burning sensations in the area. 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 He is not on blood thinners but takes 81 mg of aspirin daily.  His MAC infection treatment has been adjusted due to prolonged QT changes on EKG, leading to the discontinuation of clofazimine  and rifampin . He is currently on ethambutol  and azithromycin . Recent sputum tests from June 4th, 5th, and 6th showed two positive and one negative result.  He has difficulty breathing, especially at night, which may be related to his COPD. He uses home oxygen occasionally, particularly at night, and has three nebulizer medications: Upeldry once daily, Brovana  twice daily, and Pulmicort  twice daily, along with a rescue inhaler every three to four hours. He experiences low energy levels, often feeling weak and tired, and has trouble with his oxygen levels dropping in the morning.  He has a significant smoking history, currently smoking about two packs a day. He recently had an eye examination due to ethambutol  use, with no significant changes noted.  He reports pain in his left shoulder, suspecting a torn rotator cuff, which limits his movement.        Following taken from last note Paul Carpenter is a 68 y.o. 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 His treatment regimen includes clofazimine , azithromycin , and ethambutol .  He was on rifampin  for nearly 1 1/2 yr s and stopped last year at [facility] because of side effects and he was started on clofazamine. Previously, he received amikacin  intravenously, but this was discontinued due to hearing issues. He has since been using inhaled amikacin , recommended to be taken after salbutamol, but this exacerbated his breathing difficulties, and he continues to experience persistent coughing. HE was prescribed amikacin  nebulizer but that made his breathing worse- HE was referred for pulmonary rehab and went 4 tines  Despite treatment, the MAC infection remains present, with a cavity in the lung that has not resolved. He has been on MAC treatment for over three years, and there is uncertainty about the long-term efficacy of continuing azithromycin  and ethambutol .  He has a history of COPD, which significantly contributes to his current respiratory symptoms. He experiences exacerbations of COPD, which have been more pronounced recently. His current medication regimen for COPD includes Yupelry Pulmicort , and Brovana , with albuterol  used as needed. He reports a decline in mobility and increased difficulty with physical activities, such as gardening, where his oxygen levels dropped to 78%.     Following taken from last note  Patient has a complicated lung history As per Dr. Jerone note he was initially diagnosed with  non-small cell carcinoma of the lung in 2019. On 04/11/2018 a bronch with EBUS done and biopsy taken of subcarinal mass- it showed  - POSITIVE FOR MALIGNANT CELLS IN A NECROTIC BACKGROUND.  MRI of the brain  completed on Mar 28, 2018 did not reveal any metastatic disease.  He completed radiation June 26, 2018.  He completed concurrent single agent carboplatinum on June 21, 2018.  He had a reaction to Taxol  during cycle 1 and this was discontinued.  He completed a year-long maintenance subdural level lab on June 27, 2019.  In July 2022 there was a concern for slight progression of the disease.  PET scan done on 06/03/2021 revealed a spiculated cavitary mass in the apical segment of the right upper lobe with similar SUV which was 3.5 versus 3.31-year ago.  So this was reassuring as per the radiologist.  However disease progression could not be definitely excluded.   no metastasis seen.  A CT scan done on 07/20/2021 showed continued slow evolution of a thick-walled cavitary mass in the right upper lobe which remains concerning for slow-growing neoplasm.  He had a fall and fractured his C 2 in December , 2022.  He was in [facility] at that time.  CTs chest that was done that showed a large right upper lobe soft tissue mass with similar abnormal tissue involving the right pulmonary hilum extending medially into the ipsilateral mediastinum in keeping with a reported history of primary lung cancer. In January he was hospitalized at [facility] for hyponatremia.  At that time oncologist saw him and got another PET scan.  A bronchoscopy/biopsy could not be done due to C2 fracture.  A PET scan was done on 11/16/2021 and it showed similar size of the cavitary process in the right upper lobe.  Increased size and hypermetabolism compared to the PET on 06/02/2021.  Hypermetabolic pulmonary nodules many of which were new compared to the CAT scan of 10/20/2021.    He was diagnosed to have recurrence and was started on palliative chemo with  taxotere ( 4 cycles) on 11/26/21- completed on 01/28/22 He was hospitalized  02/03/22-02/05/22 for sob and cough and fever and sputum sent which was pseudomonas, but realized after his discharge He saw Dr.[name]  on 02/09/22 and received a 2 week course of cipro  for necrotizing pneumonia with pseudomonas. A repeat PET scan done on 02/23/22 showed Thick-walled cavitary mass in the right upper lobe has decreased in overall hypermetabolism, indicative of treatment response. 2. Multifocal areas of nodular consolidation and peribronchovascular nodularity with a waxing and waning appearance, suggesting combination slight improvement in metastatic disease with a superimposed atypical/fungal infectious process. 3. Mildly hypermetabolic low right paratracheal and right hilar lymph nodes, metastatic or reactive. 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 and he was referred to me for treatment  Past Medical History:  Diagnosis Date   Anginal pain (HCC)    Anxiety    Asthma    Chest pain    CHF (congestive heart failure) (HCC)    Chicken pox    Complication of anesthesia    o2 dropped after neck fusion   COPD (chronic obstructive pulmonary disease) (HCC)    Coronary artery disease    Cough    chronic  clear phlegm   Dysrhythmia    palpitations   GERD (gastroesophageal reflux disease)    h/o reflux/ hoarsness   Hematochezia    Hemorrhoids    History of chickenpox    History of colon polyps  History of Helicobacter pylori infection    Hoarseness    Hypertension    Lung cancer (HCC) 05/2016   Chemo + rad tx's.    Migraines    OSA (obstructive sleep apnea)    has CPAP but does not use   Personal history of tobacco use, presenting hazards to health 03/05/2016   Pneumonia    5/17   Raynaud disease    Raynaud disease    Raynaud's disease    Rotator cuff tear    on right   Shortness of breath dyspnea    Sleep apnea    Ulcer (traumatic) of oral mucosa     Past Surgical History:  Procedure Laterality Date   BACK SURGERY     cervical fusion x 2   CARDIAC CATHETERIZATION     CERVICAL DISCECTOMY     x 2   COLONOSCOPY     COLONOSCOPY N/A 07/25/2015   Procedure: COLONOSCOPY;  Surgeon:  Gladis RAYMOND Mariner, MD;  Location: [facility] ENDOSCOPY;  Service: Endoscopy;  Laterality: N/A;   COLONOSCOPY WITH PROPOFOL  N/A 10/04/2017   Procedure: COLONOSCOPY WITH PROPOFOL ;  Surgeon: Mariner Gladis RAYMOND, MD;  Location: [facility] ENDOSCOPY;  Service: Endoscopy;  Laterality: N/A;   COLONOSCOPY WITH PROPOFOL  N/A 07/10/2020   Procedure: COLONOSCOPY WITH PROPOFOL ;  Surgeon: Toledo, Ladell POUR, MD;  Location: [facility] ENDOSCOPY;  Service: Gastroenterology;  Laterality: N/A;   ELECTROMAGNETIC NAVIGATION BROCHOSCOPY Left 06/28/2016   Procedure: ELECTROMAGNETIC NAVIGATION BRONCHOSCOPY;  Surgeon: Jorie Cha, MD;  Location: [facility] ORS;  Service: Cardiopulmonary;  Laterality: Left;   ENDOBRONCHIAL ULTRASOUND N/A 04/11/2018   Procedure: ENDOBRONCHIAL ULTRASOUND;  Surgeon: Isaiah Scrivener, MD;  Location: [facility] ORS;  Service: Cardiopulmonary;  Laterality: N/A;   ESOPHAGOGASTRODUODENOSCOPY N/A 07/25/2015   Procedure: ESOPHAGOGASTRODUODENOSCOPY (EGD);  Surgeon: Gladis RAYMOND Mariner, MD;  Location: [facility] ENDOSCOPY;  Service: Endoscopy;  Laterality: N/A;   ESOPHAGOGASTRODUODENOSCOPY (EGD) WITH PROPOFOL  N/A 07/10/2020   Procedure: ESOPHAGOGASTRODUODENOSCOPY (EGD) WITH PROPOFOL ;  Surgeon: Toledo, Ladell POUR, MD;  Location: [facility] ENDOSCOPY;  Service: Gastroenterology;  Laterality: N/A;   NASAL SINUS SURGERY     x 2    PORTA CATH INSERTION N/A 04/24/2018   Procedure: PORTA CATH INSERTION;  Surgeon: Marea Selinda RAMAN, MD;  Location: [facility] INVASIVE CV LAB;  Service: Cardiovascular;  Laterality: N/A;   PORTA CATH REMOVAL N/A 03/08/2024   Procedure: PORTA CATH REMOVAL;  Surgeon: Marea Selinda RAMAN, MD;  Location: [facility] INVASIVE CV LAB;  Service: Cardiovascular;  Laterality: N/A;   rotator cuff surgery Right    07/2016   SEPTOPLASTY     SKIN GRAFT     UPPER EXTREMITY VENOGRAPHY Right 04/16/2024   Procedure: UPPER EXTREMITY VENOGRAPHY;  Surgeon: Marea Selinda RAMAN, MD;  Location: [facility] INVASIVE CV LAB;  Service: Cardiovascular;  Laterality: Right;    Social History    Socioeconomic History   Marital status: Married    Spouse name: Not on file   Number of children: 3   Years of education: Not on file   Highest education level: Not on file  Occupational History   Occupation: Holiday representative Work  Tobacco Use   Smoking status: Every Day    Current packs/day: 1.50    Average packs/day: 3.0 packs/day for 52.6 years (156.4 ttl pk-yrs)    Types: Cigarettes    Start date: 1973    Passive exposure: Current   Smokeless tobacco: Never  Vaping Use   Vaping status: Never Used  Substance and Sexual Activity   Alcohol use: Not Currently  Alcohol/week: 2.0 standard drinks of alcohol    Types: 2 Standard drinks or equivalent per week    Comment: moderate   Drug use: No   Sexual activity: Not on file  Other Topics Concern   Not on file  Social History Narrative   Not on file   Social Drivers of Health   Financial Resource Strain: Low Risk  (02/08/2024)   Received from [facility]   Overall Financial Resource Strain (CARDIA)    Difficulty of Paying Living Expenses: Not hard at all  Food Insecurity: No Food Insecurity (02/08/2024)   Received from [facility]   Hunger Vital Sign    Within the past 12 months, you worried that your food would run out before you got the money to buy more.: Never true    Within the past 12 months, the food you bought just didn't last and you didn't have money to get more.: Never true  Transportation Needs: No Transportation Needs (02/08/2024)   Received from [facility] - Transportation    In the past 12 months, has lack of transportation kept you from medical appointments or from getting medications?: No    Lack of Transportation (Non-Medical): No  Physical Activity: Inactive (10/04/2023)   Received from [facility]   Exercise Vital Sign    On average, how many days per week do you engage in moderate to strenuous exercise (like a brisk walk)?: 0 days    On  average, how many minutes do you engage in exercise at this level?: 0 min  Stress: No Stress Concern Present (10/04/2023)   Received from [facility] - Occupational Stress Questionnaire    Feeling of Stress : Not at all  Social Connections: Socially Integrated (11/09/2023)   Social Connection and Isolation Panel    Frequency of Communication with Friends and Family: More than three times a week    Frequency of Social Gatherings with Friends and Family: More than three times a week    Attends Religious Services: 1 to 4 times per year    Active Member of [employer] or Organizations: No    Attends Engineer, structural: 1 to 4 times per year    Marital Status: Married  Catering manager Violence: Not At Risk (11/09/2023)   Humiliation, Afraid, Rape, and Kick questionnaire    Fear of Current or Ex-Partner: No    Emotionally Abused: No    Physically Abused: No    Sexually Abused: No    Family History  Problem Relation Age of Onset   Heart disease Father    Prostate cancer Father    Heart disease Paternal Grandmother    Heart attack Maternal Grandfather 52   Kidney cancer Neg Hx    Bladder Cancer Neg Hx    Other Neg Hx        pituitary abnormality   Allergies  Allergen Reactions   Lisinopril Rash   Varenicline  Rash   I? Current Outpatient Medications  Medication Sig Dispense Refill   acetaminophen  (TYLENOL ) 500 MG tablet Take 500 mg by mouth daily as needed.     albuterol  (PROVENTIL ) (2.5 MG/3ML) 0.083% nebulizer solution Take 3 mLs (2.5 mg total) by nebulization 4 (four) times daily as needed for wheezing or shortness of breath. 75 mL 11   albuterol  (VENTOLIN  HFA) 108 (90 Base) MCG/ACT inhaler INHALE 2 PUFFS BY MOUTH EVERY 4 HOURS AS NEEDED  FOR WHEEZE OR FOR SHORTNESS OF BREATH 18 each 2   ammonium lactate (LAC-HYDRIN) 12 % lotion Apply topically.     arformoterol  (BROVANA ) 15 MCG/2ML NEBU Take 2 mLs (15 mcg total) by nebulization 2  (two) times daily. Dx: J44.9 120 mL 11   aspirin EC 81 MG tablet Take 81 mg by mouth daily. Swallow whole.     atorvastatin  (LIPITOR) 10 MG tablet Take 10 mg by mouth daily.     azithromycin  (ZITHROMAX ) 500 MG tablet TAKE 1 TABLET (500 MG TOTAL) BY MOUTH DAILY. 30 tablet 6   budesonide  (PULMICORT ) 0.5 MG/2ML nebulizer solution Take 2 mLs (0.5 mg total) by nebulization 2 (two) times daily. 360 mL 3   chlorpheniramine-HYDROcodone  (TUSSIONEX) 10-8 MG/5ML Take 5 mLs by mouth every 12 (twelve) hours as needed for cough. 115 mL 0   clotrimazole  (MYCELEX ) 10 MG troche Take 10 mg by mouth 3 (three) times daily.     cyanocobalamin (VITAMIN B12) 1000 MCG/ML injection Inject 1 mL (1,000 mcg total) into the muscle monthly for 180 days     diazepam  (VALIUM ) 5 MG tablet Take 5 mg by mouth at bedtime as needed.     ethambutol  (MYAMBUTOL ) 400 MG tablet Take 3 tablets (1,200 mg total) by mouth daily. 90 tablet 6   ferrous sulfate  325 (65 FE) MG tablet Take 325 mg by mouth at bedtime.     Fluocinolone Acetonide 0.01 % OIL PLEASE SEE ATTACHED FOR DETAILED DIRECTIONS     folic acid  (FOLVITE ) 1 MG tablet Take 1 mg by mouth daily.     guaiFENesin  (MUCINEX ) 600 MG 12 hr tablet Take 1 tablet (600 mg total) by mouth 2 (two) times daily. 60 tablet 0   hydrocortisone  (CORTEF ) 10 MG tablet Take 2 tablets in the morning and 1 tablet mid afternoon (2 to 4 PM) 90 tablet 0   levothyroxine  (SYNTHROID ) 175 MCG tablet Take 175 mcg by mouth daily before breakfast.     lidocaine -prilocaine  (EMLA ) cream APPLY 1 APPLICATION TOPICALLY AS NEEDED. 30 g 1   magnesium  oxide (MAG-OX) 400 MG tablet Take 800 mg by mouth 2 (two) times daily.     melatonin 3 MG TABS tablet Take 3 mg by mouth at bedtime as needed.     metoprolol  succinate (TOPROL -XL) 25 MG 24 hr tablet Take 12.5-25 mg by mouth daily as needed (Acute High Blood Pressure).     naproxen  sodium (ANAPROX  DS) 550 MG tablet Take 1 tablet (550 mg total) by mouth 2 (two) times daily as  needed for moderate pain (pain score 4-6). Take with meals 30 tablet 1   nicotine  (NICODERM CQ  - DOSED IN MG/24 HOURS) 21 mg/24hr patch Place 21 mg onto the skin daily.     nitroGLYCERIN  (NITROSTAT ) 0.4 MG SL tablet Place under the tongue.     OXYGEN Inhale 2 L into the lungs at bedtime.     pantoprazole  (PROTONIX ) 40 MG tablet Take 1 tablet (40 mg total) by mouth 2 (two) times daily before meals     promethazine -dextromethorphan  (PROMETHAZINE -DM) 6.25-15 MG/5ML syrup Take 5 mLs by mouth 4 (four) times daily as needed for cough. 180 mL 1   sodium chloride  1 g tablet Take 5 tablets (5 g total) by mouth 3 (three) times daily with meals.     tadalafil  (CIALIS ) 5 MG tablet Take 1 tablet (5 mg total) by mouth daily as needed for erectile dysfunction. 90 tablet 3   traMADol  (ULTRAM ) 50 MG tablet Take 50 mg by  mouth every 6 (six) hours as needed.     YUPELRI  175 MCG/3ML nebulizer solution TAKE 3 MLS (175 MCG TOTAL) BY NEBULIZATION DAILY. DX: J44.9 90 mL 11   No current facility-administered medications for this visit.   Facility-Administered Medications Ordered in Other Visits  Medication Dose Route Frequency Provider Last Rate Last Admin   heparin  lock flush 100 unit/mL  500 Units Intravenous Once [name], Ekam J, MD         Abtx:  Anti-infectives (From admission, onward)    None       REVIEW OF SYSTEMS:  Const: No fever or chills. Tired no energy  Eyes: negative diplopia or visual changes, negative eye pain ENT: negative coryza, negative sore throat Resp: cough, ,dyspnea Cards: negative for chest pain, palpitations, lower extremity edema GU: negative for frequency, dysuria and hematuria GI: poor appetite , weight loss Skin: negative for rash and pruritus Heme: negative for easy bruising and gum/nose bleeding MS: fatigue Neurolo:negative for headaches, dizziness, vertigo, memory problems  Psych:depression  Endocrine: negative for thyroid , diabetes Allergy/Immunology-as  above:  VITALS:  BP 136/80   Pulse (!) 102   Temp 98.4 F (36.9 C) (Temporal)   Ht 5' 11 (1.803 m)   Wt 160 lb (72.6 kg)   SpO2 96%   BMI 22.32 kg/m   Weight was 192 in May 2023   PHYSICAL EXAM:  General: Alert, cooperative, no  distress at rest,   Lungs: b/l air entry Rhonchi b/l- crepts rt side Heart: Regular rate and rhythm, no murmur, rub or gallop. Abdomen: did not examine Extremities: atraumatic, no cyanosis. No edema. No clubbing Skin: No rashes or lesions. Or bruising Lymph: Cervical, supraclavicular normal. Neurologic: Grossly non-focal  Labs    Latest Ref Rng & Units 11/12/2023    5:57 AM 11/09/2023    5:55 AM 11/08/2023    5:10 PM  CBC  WBC 4.0 - 10.5 K/uL 8.7  7.5  9.7   Hemoglobin 13.0 - 17.0 g/dL 86.7  86.6  85.6   Hematocrit 39.0 - 52.0 % 39.1  38.9  41.3   Platelets 150 - 400 K/uL 180  204  213        Latest Ref Rng & Units 04/16/2024   10:26 AM 01/16/2024    9:27 AM 11/14/2023    6:14 AM  CMP  Glucose 70 - 99 mg/dL  88  867   BUN 8 - 23 mg/dL 7  6  13    Creatinine 0.61 - 1.24 mg/dL 9.32  9.33  9.34   Sodium 135 - 145 mmol/L  137  131   Potassium 3.5 - 5.1 mmol/L  3.7  4.5   Chloride 98 - 111 mmol/L  99  92   CO2 22 - 32 mmol/L  30  31   Calcium  8.9 - 10.3 mg/dL  8.7  8.2     s  ? Impression/Recommendation Mycobacterium Avium Complex (MAC) lung infection   Chronic MAC lung infection with a cavity in the right lung has been well-managed for two years without significant progression. Current symptoms are due to COPD exacerbation. He is on azithromycin , ethambutol ,  Clofazamine stopped by Dr.olivieri at [facility].     Chronic Obstructive Pulmonary Disease (COPD) with exacerbation   COPD exacerbation is the primary cause of symptoms, including dyspnea and reduced mobility, with significant sputum production. Smoking history contributes to disease progression. He uses Yupelri , Pulmicort , Brovana , and albuterol  as needed. Pulmonary rehabilitation is  recommended to improve lung function. Smoking  cessation is crucial. Encourage smoking cessation. . Use albuterol  as needed for acute symptoms.  Consider hypertonic saline nebulizer for sputum clearance. Order sputum culture for bacterial infections.  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 Inhaled amikacin  was not tolerated due to respiratory side effects. Discontinued inhaled amikacin .  Lung Cancer with radiation therapy   Cancer treated with radiation has contributed to lung cavity formation. There are no signs of recurrence. Followed by Dr.finnegan   The cavity serves as a MAC reservoir without significant issues.    Follow-up   Ongoing management and monitoring of COPD and MAC are necessary.  Follow up with Dr. Tamea for COPD management. Follow up with Dr.Olivier for MAC lung management .  Ensure attendance at pulmonary rehabilitation sessions.  Hypothyroidism on Synthroid - recently increased   Hyponatremia due to SIADH- recently admitted  for that. On sodium tablets+ magnesium   Adrenal insufficiency-Addison's disease- on hydrocortisone - managed by Dr.Solum Weight loss of 28 pounds since May 2023 when he started MAC treatment    Discussed with patient  in detail  Will do sputum afb x3   __________________________________________________

## 2024-06-07 NOTE — Patient Instructions (Signed)
 During today's visit, we reviewed your ongoing treatment for the MAC infection, COPD, and recent issues with your right subclavian vein stent. We discussed your current medications, recent test results, and made adjustments to your treatment plan to better manage your symptoms and improve your overall health.  YOUR PLAN:  -PULMONARY MYCOBACTERIUM AVIUM COMPLEX (MAC) INFECTION: This is a type of lung infection caused by bacteria. Your treatment with azithromycin  and ethambutol  is showing a partial response. We will continue with these medications and repeat sputum cultures in August to monitor progress. Please obtain a report from Dr. Mevelyn regarding your recent eye examination related to ethambutol  use. We will also keep an eye on any side effects from your current medications.  -CHRONIC OBSTRUCTIVE PULMONARY DISEASE (COPD) WITH CHRONIC RESPIRATORY FAILURE AND HYPOXEMIA: COPD is a chronic lung disease that makes it hard to breathe and can cause low oxygen levels. To manage your symptoms, continue using your home oxygen, especially at night, and stick to your current nebulizer treatments: Upeldry once daily, Brovana  twice daily, Pulmicort  twice daily, and your rescue inhaler as needed. Pulmonary rehabilitation twice a week is recommended to improve your lung function and endurance. It's very important to reduce your smoking to one pack per day starting Monday, with a goal to further reduce to half a pack and eventually quit.  -RIGHT SUBCLAVIAN VEIN STENT PLACEMENT FOR VENOUS OCCLUSION WITH SUPERFICIAL VEIN THROMBOSIS OF RIGHT BASILIC VEIN: This condition involves a blockage in the vein that required a stent to keep it open, and you now have a blood clot in a nearby vein. Continue taking aspirin 81 mg daily to help prevent further clots. Monitor any stinging and burning sensations and report any changes.  INSTRUCTIONS:  Repeat sputum cultures in August to monitor the MAC infection. Obtain a report from  Dr. Mevelyn regarding your recent eye examination. Continue with pulmonary rehabilitation twice a week. Reduce cigarette consumption to one pack per day starting Monday, with a goal to further reduce to half a pack and eventually quit.

## 2024-06-11 ENCOUNTER — Encounter: Attending: Pulmonary Disease | Admitting: *Deleted

## 2024-06-11 ENCOUNTER — Other Ambulatory Visit
Admission: RE | Admit: 2024-06-11 | Discharge: 2024-06-11 | Disposition: A | Discharge status: Home / Self Care | Source: Ambulatory Visit | Attending: Infectious Diseases | Admitting: Infectious Diseases

## 2024-06-11 DIAGNOSIS — J449 Chronic obstructive pulmonary disease, unspecified: Secondary | ICD-10-CM | POA: Diagnosis present

## 2024-06-11 DIAGNOSIS — A310 Pulmonary mycobacterial infection: Secondary | ICD-10-CM | POA: Insufficient documentation

## 2024-06-11 LAB — CBC WITH DIFFERENTIAL/PLATELET
Abs Immature Granulocytes: 0.01 K/uL (ref 0.00–0.07)
Basophils Absolute: 0.1 K/uL (ref 0.0–0.1)
Basophils Relative: 1 %
Eosinophils Absolute: 0.1 K/uL (ref 0.0–0.5)
Eosinophils Relative: 1 %
HCT: 38.1 % — ABNORMAL LOW (ref 39.0–52.0)
Hemoglobin: 12.9 g/dL — ABNORMAL LOW (ref 13.0–17.0)
Immature Granulocytes: 0 %
Lymphocytes Relative: 7 %
Lymphs Abs: 0.6 K/uL — ABNORMAL LOW (ref 0.7–4.0)
MCH: 31.2 pg (ref 26.0–34.0)
MCHC: 33.9 g/dL (ref 30.0–36.0)
MCV: 92.3 fL (ref 80.0–100.0)
Monocytes Absolute: 0.6 K/uL (ref 0.1–1.0)
Monocytes Relative: 7 %
Neutro Abs: 7 K/uL (ref 1.7–7.7)
Neutrophils Relative %: 84 %
Platelets: 240 K/uL (ref 150–400)
RBC: 4.13 MIL/uL — ABNORMAL LOW (ref 4.22–5.81)
RDW: 14.1 % (ref 11.5–15.5)
WBC: 8.2 K/uL (ref 4.0–10.5)
nRBC: 0 % (ref 0.0–0.2)

## 2024-06-11 LAB — COMPREHENSIVE METABOLIC PANEL WITH GFR
ALT: 16 U/L (ref 0–44)
AST: 22 U/L (ref 15–41)
Albumin: 3.6 g/dL (ref 3.5–5.0)
Alkaline Phosphatase: 79 U/L (ref 38–126)
Anion gap: 9 (ref 5–15)
BUN: 8 mg/dL (ref 8–23)
CO2: 30 mmol/L (ref 22–32)
Calcium: 8.8 mg/dL — ABNORMAL LOW (ref 8.9–10.3)
Chloride: 95 mmol/L — ABNORMAL LOW (ref 98–111)
Creatinine, Ser: 0.71 mg/dL (ref 0.61–1.24)
GFR, Estimated: >60 mL/min (ref >60)
Glucose, Bld: 99 mg/dL (ref 70–99)
Potassium: 4.3 mmol/L (ref 3.5–5.1)
Sodium: 134 mmol/L — ABNORMAL LOW (ref 135–145)
Total Bilirubin: 0.6 mg/dL (ref 0.0–1.2)
Total Protein: 6.5 g/dL (ref 6.5–8.1)

## 2024-06-11 NOTE — Progress Notes (Signed)
 Daily Session Note  Patient Details  Name: Paul Carpenter MRN: [id number] Date of Birth: 07/06/1956 Referring Provider:   [facility] from 05/30/2024 in [facility] Cardiac and Pulmonary Rehab  Referring Provider Dr. Dedra Sanders    Encounter Date: 06/11/2024  Check In:  Session Check In - 06/11/24 1403       Check-In   Supervising physician immediately available to respond to emergencies See telemetry face sheet for immediately available ER MD    Location [facility]-Cardiac & Pulmonary Rehab    Staff Present Maxon Conetta BS, Exercise Physiologist;Margaret Best, MS, Exercise Physiologist;Meredith Tressa RN,BSN;Noah Tickle, BS, Exercise Physiologist    Virtual Visit No    Medication changes reported     No    Fall or balance concerns reported    No    Tobacco Cessation No Change    Warm-up and Cool-down Performed on first and last piece of equipment    Resistance Training Performed Yes    VAD Patient? No    PAD/SET Patient? No      Pain Assessment   Currently in Pain? No/denies             Social History   Tobacco Use  Smoking Status Every Day   Current packs/day: 1.50   Average packs/day: 3.0 packs/day for 52.6 years (156.4 ttl pk-yrs)   Types: Cigarettes   Start date: 1973   Passive exposure: Current  Smokeless Tobacco Never    Goals Met:  Proper associated with RPD/PD & O2 Sat Independence with exercise equipment Using PLB without cueing & demonstrates good technique Exercise tolerated well No report of concerns or symptoms today Strength training completed today  Goals Unmet:  Not Applicable  Comments: First full day of exercise!  Patient was oriented to gym and equipment including functions, settings, policies, and procedures.  Patient's individual exercise prescription and treatment plan were reviewed.  All starting workloads were established based on the results of the 6 minute walk test done at initial orientation visit.  The plan for  exercise progression was also introduced and progression will be customized based on patient's performance and goals.    Dr. Oneil Pinal is Medical Director for [facility] Cardiac Rehabilitation.  Dr. Fuad Aleskerov is Medical Director for [facility] Pulmonary Rehabilitation.

## 2024-06-12 ENCOUNTER — Other Ambulatory Visit
Admission: RE | Admit: 2024-06-12 | Discharge: 2024-06-12 | Disposition: A | Discharge status: Home / Self Care | Source: Ambulatory Visit | Attending: Infectious Diseases | Admitting: Infectious Diseases

## 2024-06-12 DIAGNOSIS — A310 Pulmonary mycobacterial infection: Secondary | ICD-10-CM | POA: Insufficient documentation

## 2024-06-12 DIAGNOSIS — J449 Chronic obstructive pulmonary disease, unspecified: Secondary | ICD-10-CM | POA: Diagnosis not present

## 2024-06-13 ENCOUNTER — Other Ambulatory Visit
Admission: RE | Admit: 2024-06-13 | Discharge: 2024-06-13 | Disposition: A | Discharge status: Home / Self Care | Source: Ambulatory Visit | Attending: Infectious Diseases | Admitting: Infectious Diseases

## 2024-06-13 DIAGNOSIS — A310 Pulmonary mycobacterial infection: Secondary | ICD-10-CM | POA: Insufficient documentation

## 2024-06-14 ENCOUNTER — Encounter: Admitting: Emergency Medicine

## 2024-06-14 DIAGNOSIS — J449 Chronic obstructive pulmonary disease, unspecified: Secondary | ICD-10-CM | POA: Diagnosis not present

## 2024-06-14 NOTE — Progress Notes (Signed)
 Daily Session Note  Patient Details  Name: Paul Carpenter MRN: [id number] Date of Birth: Oct 08, 1956 Referring Provider:   [facility] from 05/30/2024 in [facility] Cardiac and Pulmonary Rehab  Referring Provider Dr. Dedra Sanders    Encounter Date: 06/14/2024  Check In:  Session Check In - 06/14/24 1541       Check-In   Supervising physician immediately available to respond to emergencies See telemetry face sheet for immediately available ER MD    Location [facility]-Cardiac & Pulmonary Rehab    Staff Present Fairy Plater RCP,RRT,BSRT;Maxon Conetta BS, Exercise Physiologist;Noah Tickle, BS, Exercise Physiologist    Virtual Visit No    Medication changes reported     No    Fall or balance concerns reported    No    Tobacco Cessation No Change    Warm-up and Cool-down Performed on first and last piece of equipment    Resistance Training Performed Yes    VAD Patient? No    PAD/SET Patient? No      Pain Assessment   Currently in Pain? No/denies             Social History   Tobacco Use  Smoking Status Every Day   Current packs/day: 1.50   Average packs/day: 3.0 packs/day for 52.6 years (156.4 ttl pk-yrs)   Types: Cigarettes   Start date: 1973   Passive exposure: Current  Smokeless Tobacco Never    Goals Met:  Proper associated with RPD/PD & O2 Sat Independence with exercise equipment Using PLB without cueing & demonstrates good technique Exercise tolerated well No report of concerns or symptoms today Strength training completed today  Goals Unmet:  Not Applicable  Comments: Pt able to follow exercise prescription today without complaint.  Will continue to monitor for progression.    Dr. Oneil Pinal is Medical Director for [facility] Cardiac Rehabilitation.  Dr. Fuad Aleskerov is Medical Director for [facility] Pulmonary Rehabilitation.

## 2024-06-18 ENCOUNTER — Encounter

## 2024-06-18 DIAGNOSIS — J449 Chronic obstructive pulmonary disease, unspecified: Secondary | ICD-10-CM

## 2024-06-18 LAB — ACID FAST SMEAR (AFB, MYCOBACTERIA)
Acid Fast Smear: POSITIVE — AB
Acid Fast Smear: POSITIVE — AB

## 2024-06-18 NOTE — Progress Notes (Signed)
 Daily Session Note  Patient Details  Name: Paul Carpenter MRN: [id number] Date of Birth: 03-25-1956 Referring Provider:   [facility] from 05/30/2024 in [facility] Cardiac and Pulmonary Rehab  Referring Provider Dr. Dedra Sanders    Encounter Date: 06/18/2024  Check In:  Session Check In - 06/18/24 1352       Check-In   Supervising physician immediately available to respond to emergencies See telemetry face sheet for immediately available ER MD    Location [facility]-Cardiac & Pulmonary Rehab    Staff Present Tinnie Franks RN,BSN;Maxon Conetta BS, Exercise Physiologist;Susanne Bice, RN, BSN, CCRP;Noah Tickle, BS, Exercise Physiologist    Virtual Visit No    Medication changes reported     No    Fall or balance concerns reported    No    Tobacco Cessation No Change    Warm-up and Cool-down Performed on first and last piece of equipment    Resistance Training Performed Yes    VAD Patient? No    PAD/SET Patient? No      Pain Assessment   Currently in Pain? No/denies             Social History   Tobacco Use  Smoking Status Every Day   Current packs/day: 1.50   Average packs/day: 3.0 packs/day for 52.6 years (156.4 ttl pk-yrs)   Types: Cigarettes   Start date: 1973   Passive exposure: Current  Smokeless Tobacco Never    Goals Met:  Proper associated with RPD/PD & O2 Sat Independence with exercise equipment Using PLB without cueing & demonstrates good technique Exercise tolerated well No report of concerns or symptoms today Strength training completed today  Goals Unmet:  Not Applicable  Comments: Pt able to follow exercise prescription today without complaint.  Will continue to monitor for progression.    Dr. Oneil Pinal is Medical Director for [facility] Cardiac Rehabilitation.  Dr. Fuad Aleskerov is Medical Director for [facility] Pulmonary Rehabilitation.

## 2024-06-19 LAB — ACID FAST SMEAR (AFB, MYCOBACTERIA): Acid Fast Smear: NEGATIVE

## 2024-06-21 ENCOUNTER — Encounter: Admitting: *Deleted

## 2024-06-21 DIAGNOSIS — J449 Chronic obstructive pulmonary disease, unspecified: Secondary | ICD-10-CM | POA: Diagnosis not present

## 2024-06-21 NOTE — Progress Notes (Signed)
 Daily Session Note  Patient Details  Name: Paul Carpenter MRN: [id number] Date of Birth: [birth year] Referring Provider:   [facility] from 05/30/2024 in [facility] Cardiac and Pulmonary Rehab  Referring Provider Dr. Dedra Sanders    Encounter Date: 06/21/2024  Check In:  Session Check In - 06/21/24 1534       Check-In   Supervising physician immediately available to respond to emergencies See telemetry face sheet for immediately available ER MD    Location [facility]-Cardiac & Pulmonary Rehab    Staff Present Leita Franks RN,BSN;Noah Tickle, BS, Exercise Physiologist;Joseph Rolinda RCP,RRT,BSRT;Alyan Hartline Tressa RN,BSN    Virtual Visit No    Medication changes reported     No    Fall or balance concerns reported    No    Warm-up and Cool-down Performed on first and last piece of equipment    Resistance Training Performed Yes    VAD Patient? No    PAD/SET Patient? No      Pain Assessment   Currently in Pain? No/denies             Social History   Tobacco Use  Smoking Status Every Day   Current packs/day: 1.50   Average packs/day: 3.0 packs/day for 52.6 years (156.5 ttl pk-yrs)   Types: Cigarettes   Start date: 1973   Passive exposure: Current  Smokeless Tobacco Never    Goals Met:  Independence with exercise equipment Exercise tolerated well No report of concerns or symptoms today Strength training completed today  Goals Unmet:  Not Applicable  Comments: Pt able to follow exercise prescription today without complaint.  Will continue to monitor for progression.    Dr. Oneil Pinal is Medical Director for [facility] Cardiac Rehabilitation.  Dr. Fuad Aleskerov is Medical Director for [facility] Pulmonary Rehabilitation.

## 2024-06-25 ENCOUNTER — Encounter: Admitting: Emergency Medicine

## 2024-06-25 DIAGNOSIS — J449 Chronic obstructive pulmonary disease, unspecified: Secondary | ICD-10-CM

## 2024-06-25 NOTE — Progress Notes (Signed)
 Daily Session Note  Patient Details  Name: Paul Carpenter MRN: [id number] Date of Birth: Jun 10, 1956 Referring Provider:   [facility] from 05/30/2024 in [facility] Cardiac and Pulmonary Rehab  Referring Provider Dr. Dedra Sanders    Encounter Date: 06/25/2024  Check In:  Session Check In - 06/25/24 1354       Check-In   Supervising physician immediately available to respond to emergencies See telemetry face sheet for immediately available ER MD    Location [facility]-Cardiac & Pulmonary Rehab    Staff Present Rollene Paterson, MS, Exercise Physiologist;Noah Tickle, BS, Exercise Physiologist;Maxon Conetta BS, Exercise Physiologist;Prarthana Parlin RN,BSN    Virtual Visit No    Medication changes reported     No    Fall or balance concerns reported    No    Tobacco Cessation No Change    Warm-up and Cool-down Performed on first and last piece of equipment    Resistance Training Performed Yes    VAD Patient? No    PAD/SET Patient? No      Pain Assessment   Currently in Pain? No/denies             Social History   Tobacco Use  Smoking Status Every Day   Current packs/day: 1.50   Average packs/day: 3.0 packs/day for 52.6 years (156.5 ttl pk-yrs)   Types: Cigarettes   Start date: 1973   Passive exposure: Current  Smokeless Tobacco Never    Goals Met:  Proper associated with RPD/PD & O2 Sat Independence with exercise equipment Using PLB without cueing & demonstrates good technique Exercise tolerated well No report of concerns or symptoms today Strength training completed today  Goals Unmet:  Not Applicable  Comments: Pt able to follow exercise prescription today without complaint.  Will continue to monitor for progression.    Dr. Oneil Pinal is Medical Director for [facility] Cardiac Rehabilitation.  Dr. Fuad Aleskerov is Medical Director for [facility] Pulmonary Rehabilitation.

## 2024-06-26 ENCOUNTER — Other Ambulatory Visit: Payer: Self-pay | Admitting: Gastroenterology

## 2024-06-26 DIAGNOSIS — R1084 Generalized abdominal pain: Secondary | ICD-10-CM

## 2024-06-27 DIAGNOSIS — J449 Chronic obstructive pulmonary disease, unspecified: Secondary | ICD-10-CM

## 2024-06-27 NOTE — Progress Notes (Signed)
 Pulmonary Individual Treatment Plan  Patient Details  Name: Paul Carpenter MRN: [id number] Date of Birth: May 07, 1956 Referring Provider:   Conrad Ports Pulmonary Rehab from 05/30/2024 in [facility] Cardiac and Pulmonary Rehab  Referring Provider Dr. Dedra Sanders    Initial Encounter Date:  Flowsheet Row Pulmonary Rehab from 05/30/2024 in [facility] Cardiac and Pulmonary Rehab  Date 05/30/24    Visit Diagnosis: Chronic obstructive pulmonary disease, unspecified COPD type (HCC)  Patient's Home Medications on Admission:  Current Outpatient Medications:    acetaminophen  (TYLENOL ) 500 MG tablet, Take 500 mg by mouth daily as needed., Disp: , Rfl:    albuterol  (PROVENTIL ) (2.5 MG/3ML) 0.083% nebulizer solution, Take 3 mLs (2.5 mg total) by nebulization 4 (four) times daily as needed for wheezing or shortness of breath., Disp: 75 mL, Rfl: 11   albuterol  (VENTOLIN  HFA) 108 (90 Base) MCG/ACT inhaler, INHALE 2 PUFFS BY MOUTH EVERY 4 HOURS AS NEEDED FOR WHEEZE OR FOR SHORTNESS OF BREATH, Disp: 18 each, Rfl: 2   ammonium lactate (LAC-HYDRIN) 12 % lotion, Apply topically., Disp: , Rfl:    arformoterol  (BROVANA ) 15 MCG/2ML NEBU, Take 2 mLs (15 mcg total) by nebulization 2 (two) times daily. Dx: J44.9, Disp: 120 mL, Rfl: 11   aspirin EC 81 MG tablet, Take 81 mg by mouth daily. Swallow whole., Disp: , Rfl:    atorvastatin  (LIPITOR) 10 MG tablet, Take 10 mg by mouth daily., Disp: , Rfl:    azithromycin  (ZITHROMAX ) 500 MG tablet, TAKE 1 TABLET (500 MG TOTAL) BY MOUTH DAILY., Disp: 30 tablet, Rfl: 6   budesonide  (PULMICORT ) 0.5 MG/2ML nebulizer solution, Take 2 mLs (0.5 mg total) by nebulization 2 (two) times daily., Disp: 360 mL, Rfl: 3   chlorpheniramine-HYDROcodone  (TUSSIONEX) 10-8 MG/5ML, Take 5 mLs by mouth every 12 (twelve) hours as needed for cough., Disp: 115 mL, Rfl: 0   clotrimazole  (MYCELEX ) 10 MG troche, Take 10 mg by mouth 3 (three) times daily., Disp: , Rfl:    cyanocobalamin (VITAMIN B12) 1000  MCG/ML injection, Inject 1 mL (1,000 mcg total) into the muscle monthly for 180 days, Disp: , Rfl:    diazepam  (VALIUM ) 5 MG tablet, Take 5 mg by mouth at bedtime as needed., Disp: , Rfl:    ethambutol  (MYAMBUTOL ) 400 MG tablet, Take 3 tablets (1,200 mg total) by mouth daily., Disp: 90 tablet, Rfl: 6   ferrous sulfate  325 (65 FE) MG tablet, Take 325 mg by mouth at bedtime., Disp: , Rfl:    Fluocinolone Acetonide 0.01 % OIL, PLEASE SEE ATTACHED FOR DETAILED DIRECTIONS, Disp: , Rfl:    folic acid  (FOLVITE ) 1 MG tablet, Take 1 mg by mouth daily., Disp: , Rfl:    guaiFENesin  (MUCINEX ) 600 MG 12 hr tablet, Take 1 tablet (600 mg total) by mouth 2 (two) times daily., Disp: 60 tablet, Rfl: 0   hydrocortisone  (CORTEF ) 10 MG tablet, Take 2 tablets in the morning and 1 tablet mid afternoon (2 to 4 PM), Disp: 90 tablet, Rfl: 0   levothyroxine  (SYNTHROID ) 175 MCG tablet, Take 175 mcg by mouth daily before breakfast., Disp: , Rfl:    lidocaine -prilocaine  (EMLA ) cream, APPLY 1 APPLICATION TOPICALLY AS NEEDED., Disp: 30 g, Rfl: 1   magnesium  oxide (MAG-OX) 400 MG tablet, Take 800 mg by mouth 2 (two) times daily., Disp: , Rfl:    melatonin 3 MG TABS tablet, Take 3 mg by mouth at bedtime as needed., Disp: , Rfl:    metoprolol  succinate (TOPROL -XL) 25 MG 24 hr tablet, Take 12.5-25  mg by mouth daily as needed (Acute High Blood Pressure)., Disp: , Rfl:    naproxen  sodium (ANAPROX  DS) 550 MG tablet, Take 1 tablet (550 mg total) by mouth 2 (two) times daily as needed for moderate pain (pain score 4-6). Take with meals, Disp: 30 tablet, Rfl: 1   nicotine  (NICODERM CQ  - DOSED IN MG/24 HOURS) 21 mg/24hr patch, Place 21 mg onto the skin daily., Disp: , Rfl:    nitroGLYCERIN  (NITROSTAT ) 0.4 MG SL tablet, Place under the tongue., Disp: , Rfl:    OXYGEN, Inhale 2 L into the lungs at bedtime., Disp: , Rfl:    pantoprazole  (PROTONIX ) 40 MG tablet, Take 1 tablet (40 mg total) by mouth 2 (two) times daily before meals, Disp: , Rfl:     promethazine -dextromethorphan  (PROMETHAZINE -DM) 6.25-15 MG/5ML syrup, Take 5 mLs by mouth 4 (four) times daily as needed for cough., Disp: 180 mL, Rfl: 1   sodium chloride  1 g tablet, Take 5 tablets (5 g total) by mouth 3 (three) times daily with meals., Disp: , Rfl:    tadalafil  (CIALIS ) 5 MG tablet, Take 1 tablet (5 mg total) by mouth daily as needed for erectile dysfunction., Disp: 90 tablet, Rfl: 3   traMADol  (ULTRAM ) 50 MG tablet, Take 50 mg by mouth every 6 (six) hours as needed., Disp: , Rfl:    YUPELRI  175 MCG/3ML nebulizer solution, TAKE 3 MLS (175 MCG TOTAL) BY NEBULIZATION DAILY. DX: J44.9, Disp: 90 mL, Rfl: 11 No current facility-administered medications for this visit.  Facility-Administered Medications Ordered in Other Visits:    heparin  lock flush 100 unit/mL, 500 Units, Intravenous, Once, [name], Evalene PARAS, MD  Past Medical History: Past Medical History:  Diagnosis Date   Anginal pain (HCC)    Anxiety    Asthma    Chest pain    CHF (congestive heart failure) (HCC)    Chicken pox    Complication of anesthesia    o2 dropped after neck fusion   COPD (chronic obstructive pulmonary disease) (HCC)    Coronary artery disease    Cough    chronic  clear phlegm   Dysrhythmia    palpitations   GERD (gastroesophageal reflux disease)    h/o reflux/ hoarsness   Hematochezia    Hemorrhoids    History of chickenpox    History of colon polyps    History of Helicobacter pylori infection    Hoarseness    Hypertension    Lung cancer (HCC) 05/2016   Chemo + rad tx's.    Migraines    OSA (obstructive sleep apnea)    has CPAP but does not use   Personal history of tobacco use, presenting hazards to health 03/05/2016   Pneumonia    5/17   Raynaud disease    Raynaud disease    Raynaud's disease    Rotator cuff tear    on right   Shortness of breath dyspnea    Sleep apnea    Ulcer (traumatic) of oral mucosa     Tobacco Use: Social History   Tobacco Use  Smoking  Status Every Day   Current packs/day: 1.50   Average packs/day: 3.0 packs/day for 52.7 years (156.5 ttl pk-yrs)   Types: Cigarettes   Start date: 1973   Passive exposure: Current  Smokeless Tobacco Never    Labs: Review Flowsheet        No data to display           Pulmonary Assessment Scores:  Pulmonary Assessment  Scores     Row Name 05/30/24 1525 06/11/24 1717       ADL UCSD   ADL Phase -- Entry    SOB Score total -- 89    Rest -- 3    Walk -- 3    Stairs -- 5    Bath -- 4    Dress -- 2    Shop -- 5      CAT Score   CAT Score 34 --      mMRC Score   mMRC Score 3 --       UCSD: Self-administered rating of dyspnea associated with activities of daily living (ADLs) 6-point scale (0 = not at all to 5 = maximal or unable to do because of breathlessness)  Scoring Scores range from 0 to 120.  Minimally important difference is 5 units  CAT: CAT can identify the health impairment of COPD patients and is better correlated with disease progression.  CAT has a scoring range of zero to 40. The CAT score is classified into four groups of low (less than 10), medium (10 - 20), high (21-30) and very high (31-40) based on the impact level of disease on health status. A CAT score over 10 suggests significant symptoms.  A worsening CAT score could be explained by an exacerbation, poor medication adherence, poor inhaler technique, or progression of COPD or comorbid conditions.  CAT MCID is 2 points  mMRC: mMRC (Modified Medical Research Council) Dyspnea Scale is used to assess the degree of baseline functional disability in patients of respiratory disease due to dyspnea. No minimal important difference is established. A decrease in score of 1 point or greater is considered a positive change.   Pulmonary Function Assessment:   Exercise Target Goals: Exercise Program Goal: Individual exercise prescription set using results from initial 6 min walk test and THRR while  considering  patient's activity barriers and safety.   Exercise Prescription Goal: Initial exercise prescription builds to 30-45 minutes a day of aerobic activity, 2-3 days per week.  Home exercise guidelines will be given to patient during program as part of exercise prescription that the participant will acknowledge.  Education: Aerobic Exercise: - Group verbal and visual presentation on the components of exercise prescription. Introduces F.I.T.T principle from ACSM for exercise prescriptions.  Reviews F.I.T.T. principles of aerobic exercise including progression. Written material provided at class time.   Education: Resistance Exercise: - Group verbal and visual presentation on the components of exercise prescription. Introduces F.I.T.T principle from ACSM for exercise prescriptions  Reviews F.I.T.T. principles of resistance exercise including progression. Written material provided at class time.    Education: Exercise & Equipment Safety: - Individual verbal instruction and demonstration of equipment use and safety with use of the equipment. Flowsheet Row Pulmonary Rehab from 05/30/2024 in [facility] Cardiac and Pulmonary Rehab  Date 05/30/24  Educator [facility]  Instruction Review Code 1- Verbalizes Understanding    Education: Exercise Physiology & General Exercise Guidelines: - Group verbal and written instruction with models to review the exercise physiology of the cardiovascular system and associated critical values. Provides general exercise guidelines with specific guidelines to those with heart or lung disease.    Education: Flexibility, Balance, Mind/Body Relaxation: - Group verbal and visual presentation with interactive activity on the components of exercise prescription. Introduces F.I.T.T principle from ACSM for exercise prescriptions. Reviews F.I.T.T. principles of flexibility and balance exercise training including progression. Also discusses the mind body connection.  Reviews various  relaxation techniques to help reduce  and manage stress (i.e. Deep breathing, progressive muscle relaxation, and visualization). Balance handout provided to take home. Written material provided at class time.   Activity Barriers & Risk Stratification:  Activity Barriers & Cardiac Risk Stratification - 05/30/24 1519       Activity Barriers & Cardiac Risk Stratification   Activity Barriers Deconditioning;Muscular Weakness;Shortness of Breath;Other (comment)    Comments Shoulder mobility concerns          6 Minute Walk:  6 Minute Walk     Row Name 05/30/24 1516         6 Minute Walk   Phase Initial     Distance 670 feet     Walk Time 6 minutes     # of Rest Breaks 0     MPH 1.27     METS 2.43     RPE 13     Perceived Dyspnea  3     VO2 Peak 8.52     Symptoms Yes (comment)     Comments dizziness, SOB     Resting HR 88 bpm     Resting BP 114/62     Resting Oxygen Saturation  93 %     Exercise Oxygen Saturation  during 6 min walk 89 %     Max Ex. HR 99 bpm     Max Ex. BP 134/64     2 Minute Post BP 126/68       Interval HR   1 Minute HR 99     2 Minute HR 94     3 Minute HR 91     4 Minute HR 95     5 Minute HR 97     6 Minute HR 99     2 Minute Post HR 87     Interval Heart Rate? Yes       Interval Oxygen   Interval Oxygen? Yes     Baseline Oxygen Saturation % 93 %     1 Minute Oxygen Saturation % 90 %     1 Minute Liters of Oxygen 0 L     2 Minute Oxygen Saturation % 93 %     2 Minute Liters of Oxygen 0 L     3 Minute Oxygen Saturation % 88 %     3 Minute Liters of Oxygen 0 L     4 Minute Oxygen Saturation % 89 %     4 Minute Liters of Oxygen 0 L     5 Minute Oxygen Saturation % 90 %     5 Minute Liters of Oxygen 0 L     6 Minute Oxygen Saturation % 93 %     6 Minute Liters of Oxygen 0 L     2 Minute Post Oxygen Saturation % 95 %     2 Minute Post Liters of Oxygen 0 L       Oxygen Initial Assessment:  Oxygen Initial Assessment - 05/30/24 1524        Home Oxygen   Sleep Oxygen Prescription Continuous    Liters per minute 3    Home Exercise Oxygen Prescription Continuous    Liters per minute 3    Home Resting Oxygen Prescription None    Compliance with Home Oxygen Use Yes      Initial 6 min Walk   Oxygen Used None      Program Oxygen Prescription   Program Oxygen Prescription None      Intervention   Short  Term Goals To learn and demonstrate proper pursed lip breathing techniques or other breathing techniques.     Long  Term Goals Exhibits proper breathing techniques, such as pursed lip breathing or other method taught during program session          Oxygen Re-Evaluation:  Oxygen Re-Evaluation     Row Name 06/11/24 1413             Goals/Expected Outcomes   Comments Reviewed PLB technique with pt.  Talked about how it works and it's importance in maintaining their exercise saturations.       Goals/Expected Outcomes Short: Become more profiecient at using PLB.   Long: Become independent at using PLB.          Oxygen Discharge (Final Oxygen Re-Evaluation):  Oxygen Re-Evaluation - 06/11/24 1413       Goals/Expected Outcomes   Comments Reviewed PLB technique with pt.  Talked about how it works and it's importance in maintaining their exercise saturations.    Goals/Expected Outcomes Short: Become more profiecient at using PLB.   Long: Become independent at using PLB.          Initial Exercise Prescription:  Initial Exercise Prescription - 05/30/24 1500       Date of Initial Exercise RX and Referring Provider   Date 05/30/24    Referring Provider Dr. Dedra Sanders      Oxygen   Oxygen Continuous    Liters 0-2L    Maintain Oxygen Saturation 88% or higher      Recumbant Bike   Level 1    RPM 50    Watts 25    Minutes 15    METs 2.43      NuStep   Level 1    SPM 80    Minutes 15    METs 2.43      REL-XR   Level 1    Watts 25    Speed 50    Minutes 15    METs 2.43      Track   Laps 18     Minutes 15    METs 1.98      Intensity   THRR 40-80% of Max Heartrate 114-140    Ratings of Perceived Exertion 11-13    Perceived Dyspnea 0-4      Resistance Training   Training Prescription Yes    Weight 3lb    Reps 10-15          Perform Capillary Blood Glucose checks as needed.  Exercise Prescription Changes:   Exercise Prescription Changes     Row Name 05/30/24 1500 06/20/24 1100           Response to Exercise   Blood Pressure (Admit) 114/62 134/70      Blood Pressure (Exercise) 134/64 154/82      Blood Pressure (Exit) 126/68 116/64      Heart Rate (Admit) 88 bpm 92 bpm      Heart Rate (Exercise) 99 bpm 101 bpm      Heart Rate (Exit) 81 bpm 88 bpm      Oxygen Saturation (Admit) 93 % 94 %      Oxygen Saturation (Exercise) 89 % 94 %      Oxygen Saturation (Exit) 95 % 96 %      Rating of Perceived Exertion (Exercise) 13 13      Perceived Dyspnea (Exercise) 3 3      Symptoms Dizziness, SOB none      Comments  results first 2 weeks of exercise      Duration -- Progress to 30 minutes of  aerobic without signs/symptoms of physical distress      Intensity -- THRR unchanged        Progression   Progression -- Continue to progress workloads to maintain intensity without signs/symptoms of physical distress.      Average METs -- 1.37        Resistance Training   Training Prescription -- Yes      Weight -- 3lb      Reps -- 10-15        Interval Training   Interval Training -- No        Oxygen   Oxygen -- Continuous      Liters -- 0        NuStep   Level -- 2      Minutes -- 15      METs -- 1.5        Biostep-RELP   Level -- 1      Minutes -- 15      METs -- 1        Track   Laps -- 9      Minutes -- 15      METs -- 1.49        Oxygen   Maintain Oxygen Saturation -- 88% or higher         Exercise Comments:   Exercise Comments     Row Name 06/11/24 1404           Exercise Comments First full day of exercise!  Patient was oriented  to gym and equipment including functions, settings, policies, and procedures.  Patient's individual exercise prescription and treatment plan were reviewed.  All starting workloads were established based on the results of the 6 minute walk test done at initial orientation visit.  The plan for exercise progression was also introduced and progression will be customized based on patient's performance and goals.          Exercise Goals and Review:   Exercise Goals     Row Name 05/30/24 1523             Exercise Goals   Increase Physical Activity Yes       Intervention Provide advice, education, support and counseling about physical activity/exercise needs.;Develop an individualized exercise prescription for aerobic and resistive training based on initial evaluation findings, risk stratification, comorbidities and participant's personal goals.       Expected Outcomes Short Term: Attend rehab on a regular basis to increase amount of physical activity.;Long Term: Exercising regularly at least 3-5 days a week.;Long Term: Add in home exercise to make exercise part of routine and to increase amount of physical activity.       Increase Strength and Stamina Yes       Intervention Provide advice, education, support and counseling about physical activity/exercise needs.;Develop an individualized exercise prescription for aerobic and resistive training based on initial evaluation findings, risk stratification, comorbidities and participant's personal goals.       Expected Outcomes Short Term: Increase workloads from initial exercise prescription for resistance, speed, and METs.;Short Term: Perform resistance training exercises routinely during rehab and add in resistance training at home;Long Term: Improve cardiorespiratory fitness, muscular endurance and strength as measured by increased METs and functional capacity ( )       Able to understand and use rate of perceived exertion (RPE) scale Yes  Intervention Provide education and explanation on how to use RPE scale       Expected Outcomes Short Term: Able to use RPE daily in rehab to express subjective intensity level;Long Term:  Able to use RPE to guide intensity level when exercising independently       Able to understand and use Dyspnea scale Yes       Intervention Provide education and explanation on how to use Dyspnea scale       Expected Outcomes Short Term: Able to use Dyspnea scale daily in rehab to express subjective sense of shortness of breath during exertion;Long Term: Able to use Dyspnea scale to guide intensity level when exercising independently       Knowledge and understanding of Target Heart Rate Range (THRR) Yes       Intervention Provide education and explanation of THRR including how the numbers were predicted and where they are located for reference       Expected Outcomes Short Term: Able to state/look up THRR;Short Term: Able to use daily as guideline for intensity in rehab;Long Term: Able to use THRR to govern intensity when exercising independently       Able to check pulse independently Yes       Intervention Provide education and demonstration on how to check pulse in carotid and radial arteries.;Review the importance of being able to check your own pulse for safety during independent exercise       Expected Outcomes Long Term: Able to check pulse independently and accurately;Short Term: Able to explain why pulse checking is important during independent exercise       Understanding of Exercise Prescription Yes       Intervention Provide education, explanation, and written materials on patient's individual exercise prescription       Expected Outcomes Short Term: Able to explain program exercise prescription;Long Term: Able to explain home exercise prescription to exercise independently          Exercise Goals Re-Evaluation :  Exercise Goals Re-Evaluation     Row Name 06/11/24 1405 06/11/24 1407 06/20/24 1136          Exercise Goal Re-Evaluation   Exercise Goals Review Increase Physical Activity;Able to understand and use rate of perceived exertion (RPE) scale;Knowledge and understanding of Target Heart Rate Range (THRR);Understanding of Exercise Prescription;Increase Strength and Stamina;Able to understand and use Dyspnea scale;Able to check pulse independently Increase Physical Activity;Able to understand and use rate of perceived exertion (RPE) scale;Knowledge and understanding of Target Heart Rate Range (THRR);Understanding of Exercise Prescription;Increase Strength and Stamina;Able to understand and use Dyspnea scale;Able to check pulse independently Increase Physical Activity;Increase Strength and Stamina;Understanding of Exercise Prescription     Comments Reviewed RPE and dyspnea scale, THR and program prescription with pt today.  Pt voiced understanding and was given a copy of goals to take home. -- Velinda is off to a good start in the program. He has been able to attend his first 2 sessions during this review period. During his sessions he was able to walk 9 laps on the track, and use the T4 nustep at level 2. We will continue to monitor his progress in the program.     Expected Outcomes Short: Use RPE daily to regulate intensity.  Long: Follow program prescription in THR. -- Short: Continue to follow exercise prescription. Long: Continue exercise to improve strength and stamina.        Discharge Exercise Prescription (Final Exercise Prescription Changes):  Exercise Prescription Changes - 06/20/24  1100       Response to Exercise   Blood Pressure (Admit) 134/70    Blood Pressure (Exercise) 154/82    Blood Pressure (Exit) 116/64    Heart Rate (Admit) 92 bpm    Heart Rate (Exercise) 101 bpm    Heart Rate (Exit) 88 bpm    Oxygen Saturation (Admit) 94 %    Oxygen Saturation (Exercise) 94 %    Oxygen Saturation (Exit) 96 %    Rating of Perceived Exertion (Exercise) 13    Perceived Dyspnea  (Exercise) 3    Symptoms none    Comments first 2 weeks of exercise    Duration Progress to 30 minutes of  aerobic without signs/symptoms of physical distress    Intensity THRR unchanged      Progression   Progression Continue to progress workloads to maintain intensity without signs/symptoms of physical distress.    Average METs 1.37      Resistance Training   Training Prescription Yes    Weight 3lb    Reps 10-15      Interval Training   Interval Training No      Oxygen   Oxygen Continuous    Liters 0      NuStep   Level 2    Minutes 15    METs 1.5      Biostep-RELP   Level 1    Minutes 15    METs 1      Track   Laps 9    Minutes 15    METs 1.49      Oxygen   Maintain Oxygen Saturation 88% or higher          Nutrition:  Target Goals: Understanding of nutrition guidelines, daily intake of sodium 1500mg , cholesterol 200mg , calories 30% from fat and 7% or less from saturated fats, daily to have 5 or more servings of fruits and vegetables.  Education: Nutrition 1 -Group instruction provided by verbal, written material, interactive activities, discussions, models, and posters to present general guidelines for heart healthy nutrition including macronutrients, label reading, and promoting whole foods over processed counterparts. Education serves as Pensions consultant of discussion of heart healthy eating for all. Written material provided at class time.     Education: Nutrition 2 -Group instruction provided by verbal, written material, interactive activities, discussions, models, and posters to present general guidelines for heart healthy nutrition including sodium, cholesterol, and saturated fat. Providing guidance of habit forming to improve blood pressure, cholesterol, and body weight. Written material provided at class time.     Biometrics:  Pre Biometrics - 05/30/24 1524       Pre Biometrics   Height 5' 11 (1.803 m)    Weight 159 lb 14.4 oz (72.5 kg)     Waist Circumference 34 inches    Hip Circumference 36.5 inches    Waist to Hip Ratio 0.93 %    BMI (Calculated) 22.31    Single Leg Stand 0 seconds           Nutrition Therapy Plan and Nutrition Goals:   Nutrition Assessments:  MEDIFICTS Score Key: >=70 Need to make dietary changes  40-70 Heart Healthy Diet <= 40 Therapeutic Level Cholesterol Diet  Flowsheet Row Pulmonary Rehab from 01/18/2024 in [facility] Cardiac and Pulmonary Rehab  Picture Your Plate Total Score on Admission 39   Picture Your Plate Scores: <59 Unhealthy dietary pattern with much room for improvement. 41-50 Dietary pattern unlikely to meet recommendations for good health and room for improvement.  51-60 More healthful dietary pattern, with some room for improvement.  >60 Healthy dietary pattern, although there may be some specific behaviors that could be improved.   Nutrition Goals Re-Evaluation:   Nutrition Goals Discharge (Final Nutrition Goals Re-Evaluation):   Psychosocial: Target Goals: Acknowledge presence or absence of significant depression and/or stress, maximize coping skills, provide positive support system. Participant is able to verbalize types and ability to use techniques and skills needed for reducing stress and depression.   Education: Stress, Anxiety, and Depression - Group verbal and visual presentation to define topics covered.  Reviews how body is impacted by stress, anxiety, and depression.  Also discusses healthy ways to reduce stress and to treat/manage anxiety and depression.  Written material provided at class time.   Education: Sleep Hygiene -Provides group verbal and written instruction about how sleep can affect your health.  Define sleep hygiene, discuss sleep cycles and impact of sleep habits. Review good sleep hygiene tips.    Initial Review & Psychosocial Screening:   Quality of Life Scores:  Scores of 19 and below usually indicate a poorer quality of life in these  areas.  A difference of  2-3 points is a clinically meaningful difference.  A difference of 2-3 points in the total score of the Quality of Life Index has been associated with significant improvement in overall quality of life, self-image, physical symptoms, and general health in studies assessing change in quality of life.  PHQ-9: Review Flowsheet  More data exists      05/30/2024 01/19/2024 03/10/2023 10/05/2022 07/22/2022  Depression screen PHQ 2/9  Decreased Interest 2 2 0 0 1  Down, Depressed, Hopeless 2 0 1 1 1   PHQ - 2 Score 4 2 1 1 2   Altered sleeping 3 2 - - 1  Tired, decreased energy 3 3 - - 2  Change in appetite 3 2 - - 3  Feeling bad or failure about yourself  2 2 - - 0  Trouble concentrating 1 1 - - 0  Moving slowly or fidgety/restless 1 3 - - 0  Suicidal thoughts 0 0 - - 0  PHQ-9 Score 17 15 - - 8  Difficult doing work/chores Very difficult Extremely dIfficult - - Somewhat difficult   Interpretation of Total Score  Total Score Depression Severity:  1-4 = Minimal depression, 5-9 = Mild depression, 10-14 = Moderate depression, 15-19 = Moderately severe depression, 20-27 = Severe depression   Psychosocial Evaluation and Intervention:   Psychosocial Re-Evaluation:   Psychosocial Discharge (Final Psychosocial Re-Evaluation):   Education: Education Goals: Education classes will be provided on a weekly basis, covering required topics. Participant will state understanding/return demonstration of topics presented.  Learning Barriers/Preferences:   General Pulmonary Education Topics:  Infection Prevention: - Provides verbal and written material to individual with discussion of infection control including proper hand washing and proper equipment cleaning during exercise session. Flowsheet Row Pulmonary Rehab from 05/30/2024 in [facility] Cardiac and Pulmonary Rehab  Date 05/30/24  Educator [facility]  Instruction Review Code 1- Verbalizes Understanding    Falls Prevention: - Provides  verbal and written material to individual with discussion of falls prevention and safety. Flowsheet Row Pulmonary Rehab from 05/30/2024 in [facility] Cardiac and Pulmonary Rehab  Date 05/30/24  Educator [facility]  Instruction Review Code 1- Verbalizes Understanding    Chronic Lung Disease Review: - Group verbal instruction with posters, models, PowerPoint presentations and videos,  to review new updates, new respiratory medications, new advancements in procedures and treatments. Providing information on  websites and 800 numbers for continued self-education. Includes information about supplement oxygen, available portable oxygen systems, continuous and intermittent flow rates, oxygen safety, concentrators, and Medicare reimbursement for oxygen. Explanation of Pulmonary Drugs, including class, frequency, complications, importance of spacers, rinsing mouth after steroid MDI's, and proper cleaning methods for nebulizers. Review of basic lung anatomy and physiology related to function, structure, and complications of lung disease. Review of risk factors. Discussion about methods for diagnosing sleep apnea and types of masks and machines for OSA. Includes a review of the use of types of environmental controls: home humidity, furnaces, filters, dust mite/pet prevention, HEPA vacuums. Discussion about weather changes, air quality and the benefits of nasal washing. Instruction on Warning signs, infection symptoms, calling MD promptly, preventive modes, and value of vaccinations. Review of effective airway clearance, coughing and/or vibration techniques. Emphasizing that all should Create an Action Plan. Written material provided at class time. Flowsheet Row Pulmonary Rehab from 01/26/2024 in [facility] Cardiac and Pulmonary Rehab  Education need identified 01/26/24    AED/CPR: - Group verbal and written instruction with the use of models to demonstrate the basic use of the AED with the basic ABC's of resuscitation.    Tests  and Procedures:  - Group verbal and visual presentation and models provide information about basic cardiac anatomy and function. Reviews the testing methods done to diagnose heart disease and the outcomes of the test results. Describes the treatment choices: Medical Management, Angioplasty, or Coronary Bypass Surgery for treating various heart conditions including Myocardial Infarction, Angina, Valve Disease, and Cardiac Arrhythmias.  Written material provided at class time.   Medication Safety: - Group verbal and visual instruction to review commonly prescribed medications for heart and lung disease. Reviews the medication, class of the drug, and side effects. Includes the steps to properly store meds and maintain the prescription regimen.  Written material given at graduation.   Other: -Provides group and verbal instruction on various topics (see comments)   Knowledge Questionnaire Score:  Knowledge Questionnaire Score - 06/19/24 0842       Knowledge Questionnaire Score   Pre Score 14/18           Core Components/Risk Factors/Patient Goals at Admission:  Personal Goals and Risk Factors at Admission - 05/30/24 1526       Core Components/Risk Factors/Patient Goals on Admission   Tobacco Cessation Yes    Number of packs per day <2 ppd    Intervention Assist the participant in steps to quit. Provide individualized education and counseling about committing to Tobacco Cessation, relapse prevention, and pharmacological support that can be provided by physician.;Education officer, environmental, assist with locating and accessing local/national Quit Smoking programs, and support quit date choice.    Expected Outcomes Short Term: Will demonstrate readiness to quit, by selecting a quit date.;Short Term: Will quit all tobacco product use, adhering to prevention of relapse plan.;Long Term: Complete abstinence from all tobacco products for at least 12 months from quit date.    Improve shortness of  breath with ADL's Yes    Intervention Provide education, individualized exercise plan and daily activity instruction to help decrease symptoms of SOB with activities of daily living.    Expected Outcomes Short Term: Improve cardiorespiratory fitness to achieve a reduction of symptoms when performing ADLs;Long Term: Be able to perform more ADLs without symptoms or delay the onset of symptoms    Hypertension Yes    Intervention Provide education on lifestyle modifcations including regular physical activity/exercise, weight management, moderate sodium restriction  and increased consumption of fresh fruit, vegetables, and low fat dairy, alcohol moderation, and smoking cessation.;Monitor prescription use compliance.    Expected Outcomes Short Term: Continued assessment and intervention until BP is < 140/65mm HG in hypertensive participants. < 130/31mm HG in hypertensive participants with diabetes, heart failure or chronic kidney disease.;Long Term: Maintenance of blood pressure at goal levels.    Lipids Yes    Intervention Provide education and support for participant on nutrition & aerobic/resistive exercise along with prescribed medications to achieve LDL 70mg , HDL >40mg .    Expected Outcomes Short Term: Participant states understanding of desired cholesterol values and is compliant with medications prescribed. Participant is following exercise prescription and nutrition guidelines.;Long Term: Cholesterol controlled with medications as prescribed, with individualized exercise RX and with personalized nutrition plan. Value goals: LDL < 70mg , HDL > 40 mg.          Education:Diabetes - Individual verbal and written instruction to review signs/symptoms of diabetes, desired ranges of glucose level fasting, after meals and with exercise. Acknowledge that pre and post exercise glucose checks will be done for 3 sessions at entry of program.   Know Your Numbers and Heart Failure: - Group verbal and visual  instruction to discuss disease risk factors for cardiac and pulmonary disease and treatment options.  Reviews associated critical values for Overweight/Obesity, Hypertension, Cholesterol, and Diabetes.  Discusses basics of heart failure: signs/symptoms and treatments.  Introduces Heart Failure Zone chart for action plan for heart failure. Written material provided at class time.   Core Components/Risk Factors/Patient Goals Review:    Core Components/Risk Factors/Patient Goals at Discharge (Final Review):    ITP Comments:  ITP Comments     Row Name 05/30/24 1516 06/11/24 1404 06/27/24 0945       ITP Comments Completed and gym orientation for pulmonary rehab. Initial ITP created and sent for review to Dr. Faud Aleskerov, Medical Director. First full day of exercise!  Patient was oriented to gym and equipment including functions, settings, policies, and procedures.  Patient's individual exercise prescription and treatment plan were reviewed.  All starting workloads were established based on the results of the 6 minute walk test done at initial orientation visit.  The plan for exercise progression was also introduced and progression will be customized based on patient's performance and goals. 30 Day review completed. Medical Director ITP review done; changes made as directed and signed approval by Medical Director. New to program.        Comments: 30 day review

## 2024-06-28 ENCOUNTER — Encounter: Admitting: Emergency Medicine

## 2024-06-28 DIAGNOSIS — J449 Chronic obstructive pulmonary disease, unspecified: Secondary | ICD-10-CM

## 2024-06-28 NOTE — Progress Notes (Signed)
 Daily Session Note  Patient Details  Name: Paul Carpenter MRN: [id number] Date of Birth: [birth year] Referring Provider:   Conrad Ports Pulmonary Rehab from 05/30/2024 in [facility] Cardiac and Pulmonary Rehab  Referring Provider Dr. Dedra Sanders    Encounter Date: 06/28/2024  Check In:  Session Check In - 06/28/24 1538       Check-In   Supervising physician immediately available to respond to emergencies See telemetry face sheet for immediately available ER MD    Location [facility]-Cardiac & Pulmonary Rehab    Staff Present Devaughn Jaeger, BS, Exercise Physiologist;Maxon Conetta BS, Exercise Physiologist;Clotee Schlicker RN,BSN;Joseph Stonega RCP,RRT,BSRT    Virtual Visit No    Medication changes reported     No    Fall or balance concerns reported    No    Tobacco Cessation No Change    Warm-up and Cool-down Performed on first and last piece of equipment    Resistance Training Performed Yes    VAD Patient? No    PAD/SET Patient? No      Pain Assessment   Currently in Pain? No/denies             Social History   Tobacco Use  Smoking Status Every Day   Current packs/day: 1.50   Average packs/day: 3.0 packs/day for 52.7 years (156.5 ttl pk-yrs)   Types: Cigarettes   Start date: 1973   Passive exposure: Current  Smokeless Tobacco Never    Goals Met:  Proper associated with RPD/PD & O2 Sat Independence with exercise equipment Using PLB without cueing & demonstrates good technique Exercise tolerated well No report of concerns or symptoms today Strength training completed today  Goals Unmet:  Not Applicable  Comments: Pt able to follow exercise prescription today without complaint.  Will continue to monitor for progression.    Dr. Oneil Pinal is Medical Director for [facility] Cardiac Rehabilitation.  Dr. Fuad Aleskerov is Medical Director for [facility] Pulmonary Rehabilitation.

## 2024-06-29 ENCOUNTER — Ambulatory Visit: Payer: Self-pay

## 2024-06-29 ENCOUNTER — Ambulatory Visit
Admission: RE | Admit: 2024-06-29 | Discharge: 2024-06-29 | Disposition: A | Discharge status: Home / Self Care | Source: Ambulatory Visit | Attending: Gastroenterology | Admitting: Gastroenterology

## 2024-06-29 DIAGNOSIS — R1084 Generalized abdominal pain: Secondary | ICD-10-CM

## 2024-06-29 MED ORDER — IOPAMIDOL (ISOVUE-300) INJECTION 61%
100.0000 mL | Freq: Once | INTRAVENOUS | Status: AC | PRN
  Administered 2024-06-29: 100 mL via INTRAVENOUS

## 2024-07-05 ENCOUNTER — Encounter

## 2024-07-05 LAB — ACID FAST ID BY PCR AND SUSCEPTIBILITIES
M Tuberculosis Complex: NEGATIVE
M Tuberculosis Complex: NEGATIVE
M avium complex: POSITIVE — AB
M avium complex: POSITIVE — AB

## 2024-07-05 LAB — ACID FAST CULTURE WITH REFLEXED SENSITIVITIES (MYCOBACTERIA)
Acid Fast Culture: POSITIVE — AB
Acid Fast Culture: POSITIVE — AB

## 2024-07-09 ENCOUNTER — Telehealth: Payer: Self-pay

## 2024-07-09 ENCOUNTER — Encounter: Attending: Pulmonary Disease

## 2024-07-09 DIAGNOSIS — J449 Chronic obstructive pulmonary disease, unspecified: Secondary | ICD-10-CM | POA: Insufficient documentation

## 2024-07-09 NOTE — Telephone Encounter (Signed)
 Patient called, reports he feels like he is getting sicker and weaker. Would like to know if Dr. Fayette has had a chance to collaborate with Dr. Tamea and is requesting to speak with Dr. Fayette about next steps.   Cohen Doleman, BSN, RN

## 2024-07-12 ENCOUNTER — Telehealth: Payer: Self-pay | Admitting: Infectious Diseases

## 2024-07-12 ENCOUNTER — Encounter: Admitting: Emergency Medicine

## 2024-07-12 DIAGNOSIS — J449 Chronic obstructive pulmonary disease, unspecified: Secondary | ICD-10-CM

## 2024-07-12 NOTE — Telephone Encounter (Signed)
 Spoke to patient and wife on Tuesday- late entry Pt is being treated for MAC lings for 2 years and the culture is still positive from Aug- Pt is on azithromycin  and ethambutol  now- HE is tired of taking the antibiotic as he has poor appetite, loss of taste, fatigue After discussing with his pulmonologist Dr.[name] we have decided to stop the 2 antibiotics and see how he does off antibiotics HE will watch for worsening resp symptoms, rt sided pleuritic chest pain, fever et HE has h/o lung cancer, s/p radiation chemo cavitary lung and MAC They both know to monitor the symptoms and contact me or the pulmonologist if it worsens. Has a follow up appt 10/7

## 2024-07-12 NOTE — Progress Notes (Signed)
 Daily Session Note  Patient Details  Name: Paul Carpenter MRN: [id number] Date of Birth: Dec 12, 1955 Referring Provider:   Conrad Ports Pulmonary Rehab from 05/30/2024 in [facility] Cardiac and Pulmonary Rehab  Referring Provider Dr. Dedra Sanders    Encounter Date: 07/12/2024  Check In:  Session Check In - 07/12/24 1541       Check-In   Supervising physician immediately available to respond to emergencies See telemetry face sheet for immediately available ER MD    Location [facility]-Cardiac & Pulmonary Rehab    Staff Present Rollene Paterson, MS, Exercise Physiologist;Noah Tickle, BS, Exercise Physiologist;Joseph Rolinda RCP,RRT,BSRT;Malerie Eakins RN,BSN    Virtual Visit No    Medication changes reported     No    Fall or balance concerns reported    No    Tobacco Cessation No Change    Warm-up and Cool-down Performed on first and last piece of equipment    Resistance Training Performed Yes    VAD Patient? No    PAD/SET Patient? No      Pain Assessment   Currently in Pain? No/denies             Social History   Tobacco Use  Smoking Status Every Day   Current packs/day: 1.50   Average packs/day: 3.0 packs/day for 52.7 years (156.5 ttl pk-yrs)   Types: Cigarettes   Start date: 1973   Passive exposure: Current  Smokeless Tobacco Never    Goals Met:  Proper associated with RPD/PD & O2 Sat Independence with exercise equipment Using PLB without cueing & demonstrates good technique Exercise tolerated well No report of concerns or symptoms today Strength training completed today  Goals Unmet:  Not Applicable  Comments: Pt able to follow exercise prescription today without complaint.  Will continue to monitor for progression.    Dr. Oneil Pinal is Medical Director for [facility] Cardiac Rehabilitation.  Dr. Fuad Aleskerov is Medical Director for [facility] Pulmonary Rehabilitation.

## 2024-07-13 LAB — MAC SUSCEPTIBILITY BROTH
Amikacin: 8
Ciprofloxacin: >8
Clarithromycin: >64
Doxycycline: >8
Linezolid: 32
Minocycline: >8
Moxifloxacin: 4
Rifabutin: 1
Rifampin: >4
Streptomycin: 32

## 2024-07-13 LAB — ACID FAST ID BY PCR AND SUSCEPTIBILITIES
M Tuberculosis Complex: NEGATIVE
M avium complex: POSITIVE — AB

## 2024-07-13 LAB — ACID FAST CULTURE WITH REFLEXED SENSITIVITIES (MYCOBACTERIA): Acid Fast Culture: POSITIVE — AB

## 2024-07-16 ENCOUNTER — Encounter: Admitting: Emergency Medicine

## 2024-07-16 DIAGNOSIS — J449 Chronic obstructive pulmonary disease, unspecified: Secondary | ICD-10-CM

## 2024-07-16 NOTE — Progress Notes (Signed)
 Daily Session Note  Patient Details  Name: GIULIAN GOLDRING MRN: [id number] Date of Birth: 04-Jul-1956 Referring Provider:   Conrad Ports Pulmonary Rehab from 05/30/2024 in [facility] Cardiac and Pulmonary Rehab  Referring Provider Dr. Dedra Sanders    Encounter Date: 07/16/2024  Check In:  Session Check In - 07/16/24 1402       Check-In   Supervising physician immediately available to respond to emergencies See telemetry face sheet for immediately available ER MD    Location [facility]-Cardiac & Pulmonary Rehab    Staff Present Rollene Paterson, MS, Exercise Physiologist;Noah Tickle, BS, Exercise Physiologist;Nikeisha Klutz RN,BSN;Meredith Tressa RN,BSN    Virtual Visit No    Medication changes reported     No    Fall or balance concerns reported    No    Tobacco Cessation No Change    Warm-up and Cool-down Performed on first and last piece of equipment    Resistance Training Performed Yes    VAD Patient? No    PAD/SET Patient? No      Pain Assessment   Currently in Pain? No/denies             Social History   Tobacco Use  Smoking Status Every Day   Current packs/day: 1.50   Average packs/day: 3.0 packs/day for 52.7 years (156.6 ttl pk-yrs)   Types: Cigarettes   Start date: 1973   Passive exposure: Current  Smokeless Tobacco Never    Goals Met:  Proper associated with RPD/PD & O2 Sat Independence with exercise equipment Using PLB without cueing & demonstrates good technique Exercise tolerated well No report of concerns or symptoms today Strength training completed today  Goals Unmet:  Not Applicable  Comments: Pt able to follow exercise prescription today without complaint.  Will continue to monitor for progression.    Dr. Oneil Pinal is Medical Director for [facility] Cardiac Rehabilitation.  Dr. Fuad Aleskerov is Medical Director for [facility] Pulmonary Rehabilitation.

## 2024-07-19 ENCOUNTER — Encounter: Admitting: Emergency Medicine

## 2024-07-19 DIAGNOSIS — J449 Chronic obstructive pulmonary disease, unspecified: Secondary | ICD-10-CM | POA: Diagnosis not present

## 2024-07-19 NOTE — Progress Notes (Signed)
 Daily Session Note  Patient Details  Name: CORBEN AUZENNE MRN: [id number] Date of Birth: Nov 28, 1955 Referring Provider:   Conrad Ports Pulmonary Rehab from 05/30/2024 in [facility] Cardiac and Pulmonary Rehab  Referring Provider Dr. Dedra Sanders    Encounter Date: 07/19/2024  Check In:  Session Check In - 07/19/24 1543       Check-In   Supervising physician immediately available to respond to emergencies See telemetry face sheet for immediately available ER MD    Location [facility]-Cardiac & Pulmonary Rehab    Staff Present Devaughn Jaeger, BS, Exercise Physiologist;Joseph Rolinda RCP,RRT,BSRT;Kalen Neidert RN,BSN    Virtual Visit No    Medication changes reported     No    Fall or balance concerns reported    No    Tobacco Cessation No Change    Warm-up and Cool-down Performed on first and last piece of equipment    Resistance Training Performed Yes    VAD Patient? No    PAD/SET Patient? No      Pain Assessment   Currently in Pain? No/denies             Social History   Tobacco Use  Smoking Status Every Day   Current packs/day: 1.50   Average packs/day: 3.0 packs/day for 52.7 years (156.6 ttl pk-yrs)   Types: Cigarettes   Start date: 1973   Passive exposure: Current  Smokeless Tobacco Never    Goals Met:  Proper associated with RPD/PD & O2 Sat Independence with exercise equipment Using PLB without cueing & demonstrates good technique Exercise tolerated well No report of concerns or symptoms today Strength training completed today  Goals Unmet:  Not Applicable  Comments: Pt able to follow exercise prescription today without complaint.  Will continue to monitor for progression.    Dr. Oneil Pinal is Medical Director for [facility] Cardiac Rehabilitation.  Dr. Fuad Aleskerov is Medical Director for [facility] Pulmonary Rehabilitation.

## 2024-07-23 ENCOUNTER — Encounter: Admitting: *Deleted

## 2024-07-23 DIAGNOSIS — J449 Chronic obstructive pulmonary disease, unspecified: Secondary | ICD-10-CM

## 2024-07-23 NOTE — Progress Notes (Signed)
 Daily Session Note  Patient Details  Name: ATARI NOVICK MRN: [id number] Date of Birth: [birth year] Referring Provider:   Conrad Ports Pulmonary Rehab from 05/30/2024 in [facility] Cardiac and Pulmonary Rehab  Referring Provider Dr. Dedra Sanders    Encounter Date: 07/23/2024  Check In:  Session Check In - 07/23/24 1447       Check-In   Supervising physician immediately available to respond to emergencies See telemetry face sheet for immediately available ER MD    Location [facility]-Cardiac & Pulmonary Rehab    Staff Present Hoy Rodney RN,BSN;Noah Tickle, BS, Exercise Physiologist;Margaret Best, MS, Exercise Physiologist;Maxon Conetta BS, Exercise Physiologist    Virtual Visit No    Medication changes reported     No    Fall or balance concerns reported    No    Warm-up and Cool-down Performed on first and last piece of equipment    Resistance Training Performed Yes    VAD Patient? No    PAD/SET Patient? No      Pain Assessment   Currently in Pain? No/denies             Social History   Tobacco Use  Smoking Status Every Day   Current packs/day: 1.50   Average packs/day: 3.0 packs/day for 52.7 years (156.6 ttl pk-yrs)   Types: Cigarettes   Start date: 1973   Passive exposure: Current  Smokeless Tobacco Never    Goals Met:  Independence with exercise equipment Exercise tolerated well No report of concerns or symptoms today Strength training completed today  Goals Unmet:  Not Applicable  Comments: Pt able to follow exercise prescription today without complaint.  Will continue to monitor for progression.    Dr. Oneil Pinal is Medical Director for [facility] Cardiac Rehabilitation.  Dr. Fuad Aleskerov is Medical Director for [facility] Pulmonary Rehabilitation.

## 2024-07-25 DIAGNOSIS — J449 Chronic obstructive pulmonary disease, unspecified: Secondary | ICD-10-CM

## 2024-07-25 NOTE — Progress Notes (Signed)
 Pulmonary Individual Treatment Plan  Patient Details  Name: Paul Carpenter MRN: [id number] Date of Birth: 02-11-1956 Referring Provider:   [facility] Re[facility] from 05/30/2024 in [facility]  Referring Provider Dr. Dedra Sanders    Initial Encounter Date:  Flowsheet Row Pulmonary Rehab from 05/30/2024 in [facility] Cardiac and Pulmonary Rehab  Date 05/30/24    Visit Diagnosis: Chronic obstructive pulmonary disease, unspecified COPD type (HCC)  Patient's Home Medications on Admission:  Current Outpatient Medications:    acetaminophen  (TYLENOL ) 500 MG tablet, Take 500 mg by mouth daily as needed., Disp: , Rfl:    albuterol  (PROVENTIL ) (2.5 MG/3ML) 0.083% nebulizer solution, Take 3 mLs (2.5 mg total) by nebulization 4 (four) times daily as needed for wheezing or shortness of breath., Disp: 75 mL, Rfl: 11   albuterol  (VENTOLIN  HFA) 108 (90 Base) MCG/ACT inhaler, INHALE 2 PUFFS BY MOUTH EVERY 4 HOURS AS NEEDED FOR WHEEZE OR FOR SHORTNESS OF BREATH, Disp: 18 each, Rfl: 2   ammonium lactate (LAC-HYDRIN) 12 % lotion, Apply topically., Disp: , Rfl:    arformoterol  (BROVANA ) 15 MCG/2ML NEBU, Take 2 mLs (15 mcg total) by nebulization 2 (two) times daily. Dx: J44.9, Disp: 120 mL, Rfl: 11   aspirin EC 81 MG tablet, Take 81 mg by mouth daily. Swallow whole., Disp: , Rfl:    atorvastatin  (LIPITOR) 10 MG tablet, Take 10 mg by mouth daily., Disp: , Rfl:    azithromycin  (ZITHROMAX ) 500 MG tablet, TAKE 1 TABLET (500 MG TOTAL) BY MOUTH DAILY., Disp: 30 tablet, Rfl: 6   budesonide  (PULMICORT ) 0.5 MG/2ML nebulizer solution, Take 2 mLs (0.5 mg total) by nebulization 2 (two) times daily., Disp: 360 mL, Rfl: 3   chlorpheniramine-HYDROcodone  (TUSSIONEX) 10-8 MG/5ML, Take 5 mLs by mouth every 12 (twelve) hours as needed for cough., Disp: 115 mL, Rfl: 0   clotrimazole  (MYCELEX ) 10 MG troche, Take 10 mg by mouth 3 (three) times daily., Disp: , Rfl:    cyanocobalamin (VITAMIN B12) 1000  MCG/ML injection, Inject 1 mL (1,000 mcg total) into the muscle monthly for 180 days, Disp: , Rfl:    diazepam  (VALIUM ) 5 MG tablet, Take 5 mg by mouth at bedtime as needed., Disp: , Rfl:    ethambutol  (MYAMBUTOL ) 400 MG tablet, Take 3 tablets (1,200 mg total) by mouth daily., Disp: 90 tablet, Rfl: 6   ferrous sulfate  325 (65 FE) MG tablet, Take 325 mg by mouth at bedtime., Disp: , Rfl:    Fluocinolone Acetonide 0.01 % OIL, PLEASE SEE ATTACHED FOR DETAILED DIRECTIONS, Disp: , Rfl:    folic acid  (FOLVITE ) 1 MG tablet, Take 1 mg by mouth daily., Disp: , Rfl:    guaiFENesin  (MUCINEX ) 600 MG 12 hr tablet, Take 1 tablet (600 mg total) by mouth 2 (two) times daily., Disp: 60 tablet, Rfl: 0   hydrocortisone  (CORTEF ) 10 MG tablet, Take 2 tablets in the morning and 1 tablet mid afternoon (2 to 4 PM), Disp: 90 tablet, Rfl: 0   levothyroxine  (SYNTHROID ) 175 MCG tablet, Take 175 mcg by mouth daily before breakfast., Disp: , Rfl:    lidocaine -prilocaine  (EMLA ) cream, APPLY 1 APPLICATION TOPICALLY AS NEEDED., Disp: 30 g, Rfl: 1   magnesium  oxide (MAG-OX) 400 MG tablet, Take 800 mg by mouth 2 (two) times daily., Disp: , Rfl:    melatonin 3 MG TABS tablet, Take 3 mg by mouth at bedtime as needed., Disp: , Rfl:    naproxen  sodium (ANAPROX  DS) 550 MG tablet, Take 1 tablet (  550 mg total) by mouth 2 (two) times daily as needed for moderate pain (pain score 4-6). Take with meals, Disp: 30 tablet, Rfl: 1   nicotine  (NICODERM CQ  - DOSED IN MG/24 HOURS) 21 mg/24hr patch, Place 21 mg onto the skin daily., Disp: , Rfl:    nitroGLYCERIN  (NITROSTAT ) 0.4 MG SL tablet, Place under the tongue., Disp: , Rfl:    OXYGEN, Inhale 2 L into the lungs at bedtime., Disp: , Rfl:    pantoprazole  (PROTONIX ) 40 MG tablet, Take 1 tablet (40 mg total) by mouth 2 (two) times daily before meals, Disp: , Rfl:    promethazine -dextromethorphan  (PROMETHAZINE -DM) 6.25-15 MG/5ML syrup, Take 5 mLs by mouth 4 (four) times daily as needed for cough.,  Disp: 180 mL, Rfl: 1   sodium chloride  1 g tablet, Take 5 tablets (5 g total) by mouth 3 (three) times daily with meals., Disp: , Rfl:    tadalafil  (CIALIS ) 5 MG tablet, Take 1 tablet (5 mg total) by mouth daily as needed for erectile dysfunction., Disp: 90 tablet, Rfl: 3   traMADol  (ULTRAM ) 50 MG tablet, Take 50 mg by mouth every 6 (six) hours as needed., Disp: , Rfl:    YUPELRI  175 MCG/3ML nebulizer solution, TAKE 3 MLS (175 MCG TOTAL) BY NEBULIZATION DAILY. DX: J44.9, Disp: 90 mL, Rfl: 11 No current facility-administered medications for this visit.  Facility-Administered Medications Ordered in Other Visits:    heparin  lock flush 100 unit/mL, 500 Units, Intravenous, Once, [name], Evalene PARAS, MD  Past Medical History: Past Medical History:  Diagnosis Date   Anginal pain    Anxiety    Asthma    Chest pain    CHF (congestive heart failure) (HCC)    Chicken pox    Complication of anesthesia    o2 dropped after neck fusion   COPD (chronic obstructive pulmonary disease) (HCC)    Coronary artery disease    Cough    chronic  clear phlegm   Dysrhythmia    palpitations   GERD (gastroesophageal reflux disease)    h/o reflux/ hoarsness   Hematochezia    Hemorrhoids    History of chickenpox    History of colon polyps    History of Helicobacter pylori infection    Hoarseness    Hypertension    Lung cancer (HCC) 05/2016   Chemo + rad tx's.    Migraines    OSA (obstructive sleep apnea)    has CPAP but does not use   Personal history of tobacco use, presenting hazards to health 03/05/2016   Pneumonia    5/17   Raynaud disease    Raynaud disease    Raynaud's disease    Rotator cuff tear    on right   Shortness of breath dyspnea    Sleep apnea    Ulcer (traumatic) of oral mucosa     Tobacco Use: Social History   Tobacco Use  Smoking Status Every Day   Current packs/day: 1.50   Average packs/day: 3.0 packs/day for 52.7 years (156.6 ttl pk-yrs)   Types: Cigarettes   Start  date: 1973   Passive exposure: Current  Smokeless Tobacco Never    Labs: Review Flowsheet        No data to display           Pulmonary Assessment Scores:  Pulmonary Assessment Scores     Row Name 05/30/24 1525 06/11/24 1717       ADL UCSD   ADL Phase -- Entry  SOB Score total -- 89    Rest -- 3    Walk -- 3    Stairs -- 5    Bath -- 4    Dress -- 2    Shop -- 5      CAT Score   CAT Score 34 --      mMRC Score   mMRC Score 3 --       UCSD: Self-administered rating of dyspnea associated with activities of daily living (ADLs) 6-point scale (0 = not at all to 5 = maximal or unable to do because of breathlessness)  Scoring Scores range from 0 to 120.  Minimally important difference is 5 units  CAT: CAT can identify the health impairment of COPD patients and is better correlated with disease progression.  CAT has a scoring range of zero to 40. The CAT score is classified into four groups of low (less than 10), medium (10 - 20), high (21-30) and very high (31-40) based on the impact level of disease on health status. A CAT score over 10 suggests significant symptoms.  A worsening CAT score could be explained by an exacerbation, poor medication adherence, poor inhaler technique, or progression of COPD or comorbid conditions.  CAT MCID is 2 points  mMRC: mMRC (Modified Medical Research Council) Dyspnea Scale is used to assess the degree of baseline functional disability in patients of respiratory disease due to dyspnea. No minimal important difference is established. A decrease in score of 1 point or greater is considered a positive change.   Pulmonary Function Assessment:   Exercise Target Goals: Exercise Program Goal: Individual exercise prescription set using results from initial 6 min walk test and THRR while considering  patient's activity barriers and safety.   Exercise Prescription Goal: Initial exercise prescription builds to 30-45 minutes a day of  aerobic activity, 2-3 days per week.  Home exercise guidelines will be given to patient during program as part of exercise prescription that the participant will acknowledge.  Education: Aerobic Exercise: - Group verbal and visual presentation on the components of exercise prescription. Introduces F.I.T.T principle from ACSM for exercise prescriptions.  Reviews F.I.T.T. principles of aerobic exercise including progression. Written material provided at class time.   Education: Resistance Exercise: - Group verbal and visual presentation on the components of exercise prescription. Introduces F.I.T.T principle from ACSM for exercise prescriptions  Reviews F.I.T.T. principles of resistance exercise including progression. Written material provided at class time.    Education: Exercise & Equipment Safety: - Individual verbal instruction and demonstration of equipment use and safety with use of the equipment. Flowsheet Row Pulmonary Rehab from 05/30/2024 in [facility] Cardiac and Pulmonary Rehab  Date 05/30/24  Educator [facility]  Instruction Review Code 1- Verbalizes Understanding    Education: Exercise Physiology & General Exercise Guidelines: - Group verbal and written instruction with models to review the exercise physiology of the cardiovascular system and associated critical values. Provides general exercise guidelines with specific guidelines to those with heart or lung disease.    Education: Flexibility, Balance, Mind/Body Relaxation: - Group verbal and visual presentation with interactive activity on the components of exercise prescription. Introduces F.I.T.T principle from ACSM for exercise prescriptions. Reviews F.I.T.T. principles of flexibility and balance exercise training including progression. Also discusses the mind body connection.  Reviews various relaxation techniques to help reduce and manage stress (i.e. Deep breathing, progressive muscle relaxation, and visualization). Balance handout  provided to take home. Written material provided at class time.   Activity Barriers &  Risk Stratification:  Activity Barriers & Cardiac Risk Stratification - 05/30/24 1519       Activity Barriers & Cardiac Risk Stratification   Activity Barriers Deconditioning;Muscular Weakness;Shortness of Breath;Other (comment)    Comments Shoulder mobility concerns          6 Minute Walk:  6 Minute Walk     Row Name 05/30/24 1516         6 Minute Walk   Phase Initial     Distance 670 feet     Walk Time 6 minutes     # of Rest Breaks 0     MPH 1.27     METS 2.43     RPE 13     Perceived Dyspnea  3     VO2 Peak 8.52     Symptoms Yes (comment)     Comments dizziness, SOB     Resting HR 88 bpm     Resting BP 114/62     Resting Oxygen Saturation  93 %     Exercise Oxygen Saturation  during 6 min walk 89 %     Max Ex. HR 99 bpm     Max Ex. BP 134/64     2 Minute Post BP 126/68       Interval HR   1 Minute HR 99     2 Minute HR 94     3 Minute HR 91     4 Minute HR 95     5 Minute HR 97     6 Minute HR 99     2 Minute Post HR 87     Interval Heart Rate? Yes       Interval Oxygen   Interval Oxygen? Yes     Baseline Oxygen Saturation % 93 %     1 Minute Oxygen Saturation % 90 %     1 Minute Liters of Oxygen 0 L     2 Minute Oxygen Saturation % 93 %     2 Minute Liters of Oxygen 0 L     3 Minute Oxygen Saturation % 88 %     3 Minute Liters of Oxygen 0 L     4 Minute Oxygen Saturation % 89 %     4 Minute Liters of Oxygen 0 L     5 Minute Oxygen Saturation % 90 %     5 Minute Liters of Oxygen 0 L     6 Minute Oxygen Saturation % 93 %     6 Minute Liters of Oxygen 0 L     2 Minute Post Oxygen Saturation % 95 %     2 Minute Post Liters of Oxygen 0 L       Oxygen Initial Assessment:  Oxygen Initial Assessment - 05/30/24 1524       Home Oxygen   Sleep Oxygen Prescription Continuous    Liters per minute 3    Home Exercise Oxygen Prescription Continuous    Liters per  minute 3    Home Resting Oxygen Prescription None    Compliance with Home Oxygen Use Yes      Initial 6 min Walk   Oxygen Used None      Program Oxygen Prescription   Program Oxygen Prescription None      Intervention   Short Term Goals To learn and demonstrate proper pursed lip breathing techniques or other breathing techniques.     Long  Term Goals Exhibits proper breathing techniques, such  as pursed lip breathing or other method taught during program session          Oxygen Re-Evaluation:  Oxygen Re-Evaluation     Row Name 06/11/24 1413 07/19/24 1552           Program Oxygen Prescription   Program Oxygen Prescription -- None        Home Oxygen   Home Oxygen Device -- Home Concentrator      Sleep Oxygen Prescription -- Continuous      Liters per minute -- 2      Home Exercise Oxygen Prescription -- None      Liters per minute -- --  prn      Home Resting Oxygen Prescription -- None      Compliance with Home Oxygen Use -- Yes        Goals/Expected Outcomes   Short Term Goals -- To learn and demonstrate proper pursed lip breathing techniques or other breathing techniques.       Long  Term Goals -- Exhibits proper breathing techniques, such as pursed lip breathing or other method taught during program session      Comments Reviewed PLB technique with pt.  Talked about how it works and it's importance in maintaining their exercise saturations. Informed patient how to perform the Pursed Lipped breathing technique. Told patient to Inhale through the nose and out the mouth with pursed lips to keep their airways open, help oxygenate them better, practice when at rest or doing strenuous activity. Patient Verbalizes understanding of technique and will work on and be reiterated during [facility].      Goals/Expected Outcomes Short: Become more profiecient at using PLB.   Long: Become independent at using PLB. Short: use PLB with exertion. Long: use PLB on exertion proficiently and  independently.         Oxygen Discharge (Final Oxygen Re-Evaluation):  Oxygen Re-Evaluation - 07/19/24 1552       Program Oxygen Prescription   Program Oxygen Prescription None      Home Oxygen   Home Oxygen Device Home Concentrator    Sleep Oxygen Prescription Continuous    Liters per minute 2    Home Exercise Oxygen Prescription None    Liters per minute --   prn   Home Resting Oxygen Prescription None    Compliance with Home Oxygen Use Yes      Goals/Expected Outcomes   Short Term Goals To learn and demonstrate proper pursed lip breathing techniques or other breathing techniques.     Long  Term Goals Exhibits proper breathing techniques, such as pursed lip breathing or other method taught during program session    Comments Informed patient how to perform the Pursed Lipped breathing technique. Told patient to Inhale through the nose and out the mouth with pursed lips to keep their airways open, help oxygenate them better, practice when at rest or doing strenuous activity. Patient Verbalizes understanding of technique and will work on and be reiterated during [facility].    Goals/Expected Outcomes Short: use PLB with exertion. Long: use PLB on exertion proficiently and independently.          Initial Exercise Prescription:  Initial Exercise Prescription - 05/30/24 1500       Date of Initial Exercise RX and Referring Provider   Date 05/30/24    Referring Provider Dr. Dedra Sanders      Oxygen   Oxygen Continuous    Liters 0-2L    Maintain Oxygen Saturation 88% or higher  Recumbant Bike   Level 1    RPM 50    Watts 25    Minutes 15    METs 2.43      NuStep   Level 1    SPM 80    Minutes 15    METs 2.43      REL-XR   Level 1    Watts 25    Speed 50    Minutes 15    METs 2.43      Track   Laps 18    Minutes 15    METs 1.98      Intensity   THRR 40-80% of Max Heartrate 114-140    Ratings of Perceived Exertion 11-13    Perceived Dyspnea 0-4       Resistance Training   Training Prescription Yes    Weight 3lb    Reps 10-15          Perform Capillary Blood Glucose checks as needed.  Exercise Prescription Changes:   Exercise Prescription Changes     Row Name 05/30/24 1500 06/20/24 1100 07/04/24 1500 07/19/24 0800       Response to Exercise   Blood Pressure (Admit) 114/62 134/70 122/60 128/76    Blood Pressure (Exercise) 134/64 154/82 142/82 122/62    Blood Pressure (Exit) 126/68 116/64 136/78 126/62    Heart Rate (Admit) 88 bpm 92 bpm 92 bpm 103 bpm    Heart Rate (Exercise) 99 bpm 101 bpm 113 bpm 90 bpm    Heart Rate (Exit) 81 bpm 88 bpm 98 bpm 85 bpm    Oxygen Saturation (Admit) 93 % 94 % 93 % 96 %    Oxygen Saturation (Exercise) 89 % 94 % 89 % 93 %    Oxygen Saturation (Exit) 95 % 96 % 94 % 96 %    Rating of Perceived Exertion (Exercise) 13 13 15 11     Perceived Dyspnea (Exercise) 3 3 3 3     Symptoms Dizziness, SOB none none none    Comments results first 2 weeks of exercise -- --    Duration -- Progress to 30 minutes of  aerobic without signs/symptoms of physical distress Progress to 30 minutes of  aerobic without signs/symptoms of physical distress Progress to 30 minutes of  aerobic without signs/symptoms of physical distress    Intensity -- THRR unchanged THRR unchanged THRR unchanged      Progression   Progression -- Continue to progress workloads to maintain intensity without signs/symptoms of physical distress. Continue to progress workloads to maintain intensity without signs/symptoms of physical distress. Continue to progress workloads to maintain intensity without signs/symptoms of physical distress.    Average METs -- 1.37 1.72 1.2      Resistance Training   Training Prescription -- Yes Yes Yes    Weight -- 3lb 3lb 3lb    Reps -- 10-15 10-15 10-15      Interval Training   Interval Training -- No No No      Oxygen   Oxygen -- Continuous -- --    Liters -- 0 -- --      Recumbant Bike   Level  -- -- 1 --    Watts -- -- 25 --    Minutes -- -- 15 --    METs -- -- 3.09 --      NuStep   Level -- 2 3 1     Minutes -- 15 15 30     METs -- 1.5 1.1 1.2  REL-XR   Level -- -- 1 --    Minutes -- -- 15 --      Biostep-RELP   Level -- 1 -- --    Minutes -- 15 -- --    METs -- 1 -- --      Track   Laps -- 9 -- --    Minutes -- 15 -- --    METs -- 1.49 -- --      Oxygen   Maintain Oxygen Saturation -- 88% or higher 88% or higher 88% or higher       Exercise Comments:   Exercise Comments     Row Name 06/11/24 1404           Exercise Comments First full day of exercise!  Patient was oriented to gym and equipment including functions, settings, policies, and procedures.  Patient's individual exercise prescription and treatment plan were reviewed.  All starting workloads were established based on the results of the 6 minute walk test done at initial orientation visit.  The plan for exercise progression was also introduced and progression will be customized based on patient's performance and goals.          Exercise Goals and Review:   Exercise Goals     Row Name 05/30/24 1523             Exercise Goals   Increase Physical Activity Yes       Intervention Provide advice, education, support and counseling about physical activity/exercise needs.;Develop an individualized exercise prescription for aerobic and resistive training based on initial evaluation findings, risk stratification, comorbidities and participant's personal goals.       Expected Outcomes Short Term: Attend rehab on a regular basis to increase amount of physical activity.;Long Term: Exercising regularly at least 3-5 days a week.;Long Term: Add in home exercise to make exercise part of routine and to increase amount of physical activity.       Increase Strength and Stamina Yes       Intervention Provide advice, education, support and counseling about physical activity/exercise needs.;Develop an  individualized exercise prescription for aerobic and resistive training based on initial evaluation findings, risk stratification, comorbidities and participant's personal goals.       Expected Outcomes Short Term: Increase workloads from initial exercise prescription for resistance, speed, and METs.;Short Term: Perform resistance training exercises routinely during rehab and add in resistance training at home;Long Term: Improve cardiorespiratory fitness, muscular endurance and strength as measured by increased METs and functional capacity ( )       Able to understand and use rate of perceived exertion (RPE) scale Yes       Intervention Provide education and explanation on how to use RPE scale       Expected Outcomes Short Term: Able to use RPE daily in rehab to express subjective intensity level;Long Term:  Able to use RPE to guide intensity level when exercising independently       Able to understand and use Dyspnea scale Yes       Intervention Provide education and explanation on how to use Dyspnea scale       Expected Outcomes Short Term: Able to use Dyspnea scale daily in rehab to express subjective sense of shortness of breath during exertion;Long Term: Able to use Dyspnea scale to guide intensity level when exercising independently       Knowledge and understanding of Target Heart Rate Range (THRR) Yes       Intervention Provide education and  explanation of THRR including how the numbers were predicted and where they are located for reference       Expected Outcomes Short Term: Able to state/look up THRR;Short Term: Able to use daily as guideline for intensity in rehab;Long Term: Able to use THRR to govern intensity when exercising independently       Able to check pulse independently Yes       Intervention Provide education and demonstration on how to check pulse in carotid and radial arteries.;Review the importance of being able to check your own pulse for safety during independent exercise        Expected Outcomes Long Term: Able to check pulse independently and accurately;Short Term: Able to explain why pulse checking is important during independent exercise       Understanding of Exercise Prescription Yes       Intervention Provide education, explanation, and written materials on patient's individual exercise prescription       Expected Outcomes Short Term: Able to explain program exercise prescription;Long Term: Able to explain home exercise prescription to exercise independently          Exercise Goals Re-Evaluation :  Exercise Goals Re-Evaluation     Row Name 06/11/24 1405 06/11/24 1407 06/20/24 1136 07/04/24 1519 07/19/24 0825     Exercise Goal Re-Evaluation   Exercise Goals Review Increase Physical Activity;Able to understand and use rate of perceived exertion (RPE) scale;Knowledge and understanding of Target Heart Rate Range (THRR);Understanding of Exercise Prescription;Increase Strength and Stamina;Able to understand and use Dyspnea scale;Able to check pulse independently Increase Physical Activity;Able to understand and use rate of perceived exertion (RPE) scale;Knowledge and understanding of Target Heart Rate Range (THRR);Understanding of Exercise Prescription;Increase Strength and Stamina;Able to understand and use Dyspnea scale;Able to check pulse independently Increase Physical Activity;Increase Strength and Stamina;Understanding of Exercise Prescription Increase Physical Activity;Increase Strength and Stamina;Understanding of Exercise Prescription Increase Physical Activity;Increase Strength and Stamina;Understanding of Exercise Prescription   Comments Reviewed RPE and dyspnea scale, THR and program prescription with pt today.  Pt voiced understanding and was given a copy of goals to take home. -- Velinda is off to a good start in the program. He has been able to attend his first 2 sessions during this review period. During his sessions he was able to walk 9 laps on the track,  and use the T4 nustep at level 2. We will continue to monitor his progress in the program. Velinda is off to a good start in the program. He increased to level 3 on the T4 nustep. He worked at level 1 on the recumbent bike and XR. We will continue to monitor his progress in the program. Velinda is is doing well in rehab and was only able to attend one session in this review. We will encourage him to be more consistent with rehab for exercise benefits. He decreased down to level 1 on the nustep in this session for 30 minutes. We will continue to monitor his progress in the program.   Expected Outcomes Short: Use RPE daily to regulate intensity.  Long: Follow program prescription in THR. -- Short: Continue to follow exercise prescription. Long: Continue exercise to improve strength and stamina. Short: Continue to progressively increase T4 nustep workload. Long: Continue exercise to improve strength and stamina. Short: Be more consistent with rehab. Long: Continue exercise to improve strength and stamina.      Discharge Exercise Prescription (Final Exercise Prescription Changes):  Exercise Prescription Changes - 07/19/24 0800  Response to Exercise   Blood Pressure (Admit) 128/76    Blood Pressure (Exercise) 122/62    Blood Pressure (Exit) 126/62    Heart Rate (Admit) 103 bpm    Heart Rate (Exercise) 90 bpm    Heart Rate (Exit) 85 bpm    Oxygen Saturation (Admit) 96 %    Oxygen Saturation (Exercise) 93 %    Oxygen Saturation (Exit) 96 %    Rating of Perceived Exertion (Exercise) 11    Perceived Dyspnea (Exercise) 3    Symptoms none    Duration Progress to 30 minutes of  aerobic without signs/symptoms of physical distress    Intensity THRR unchanged      Progression   Progression Continue to progress workloads to maintain intensity without signs/symptoms of physical distress.    Average METs 1.2      Resistance Training   Training Prescription Yes    Weight 3lb    Reps 10-15      Interval  Training   Interval Training No      NuStep   Level 1    Minutes 30    METs 1.2      Oxygen   Maintain Oxygen Saturation 88% or higher          Nutrition:  Target Goals: Understanding of nutrition guidelines, daily intake of sodium 1500mg , cholesterol 200mg , calories 30% from fat and 7% or less from saturated fats, daily to have 5 or more servings of fruits and vegetables.  Education: Nutrition 1 -Group instruction provided by verbal, written material, interactive activities, discussions, models, and posters to present general guidelines for heart healthy nutrition including macronutrients, label reading, and promoting whole foods over processed counterparts. Education serves as Pensions consultant of discussion of heart healthy eating for all. Written material provided at class time.     Education: Nutrition 2 -Group instruction provided by verbal, written material, interactive activities, discussions, models, and posters to present general guidelines for heart healthy nutrition including sodium, cholesterol, and saturated fat. Providing guidance of habit forming to improve blood pressure, cholesterol, and body weight. Written material provided at class time.     Biometrics:  Pre Biometrics - 05/30/24 1524       Pre Biometrics   Height 5' 11 (1.803 m)    Weight 159 lb 14.4 oz (72.5 kg)    Waist Circumference 34 inches    Hip Circumference 36.5 inches    Waist to Hip Ratio 0.93 %    BMI (Calculated) 22.31    Single Leg Stand 0 seconds           Nutrition Therapy Plan and Nutrition Goals:   Nutrition Assessments:  MEDIFICTS Score Key: >=70 Need to make dietary changes  40-70 Heart Healthy Diet <= 40 Therapeutic Level Cholesterol Diet  Flowsheet Row Pulmonary Rehab from 01/18/2024 in [facility]  Picture Your Plate Total Score on Admission 39   Picture Your Plate Scores: <59 Unhealthy dietary pattern with much room for improvement. 41-50  Dietary pattern unlikely to meet recommendations for good health and room for improvement. 51-60 More healthful dietary pattern, with some room for improvement.  >60 Healthy dietary pattern, although there may be some specific behaviors that could be improved.   Nutrition Goals Re-Evaluation:  Nutrition Goals Re-Evaluation     Row Name 07/19/24 1555             Goals   Comment Patient was informed on why it is important to maintain a balanced  diet when dealing with Respiratory issues. Explained that it takes a lot of energy to breath and when they are short of breath often they will need to have a good diet to help keep up with the calories they are expending for breathing.       Expected Outcome Short: Choose and plan snacks accordingly to patients caloric intake to improve breathing. Long: Maintain a diet independently that meets their caloric intake to aid in daily shortness of breath.          Nutrition Goals Discharge (Final Nutrition Goals Re-Evaluation):  Nutrition Goals Re-Evaluation - 07/19/24 1555       Goals   Comment Patient was informed on why it is important to maintain a balanced diet when dealing with Respiratory issues. Explained that it takes a lot of energy to breath and when they are short of breath often they will need to have a good diet to help keep up with the calories they are expending for breathing.    Expected Outcome Short: Choose and plan snacks accordingly to patients caloric intake to improve breathing. Long: Maintain a diet independently that meets their caloric intake to aid in daily shortness of breath.          Psychosocial: Target Goals: Acknowledge presence or absence of significant depression and/or stress, maximize coping skills, provide positive support system. Participant is able to verbalize types and ability to use techniques and skills needed for reducing stress and depression.   Education: Stress, Anxiety, and Depression - Group verbal  and visual presentation to define topics covered.  Reviews how body is impacted by stress, anxiety, and depression.  Also discusses healthy ways to reduce stress and to treat/manage anxiety and depression.  Written material provided at class time.   Education: Sleep Hygiene -Provides group verbal and written instruction about how sleep can affect your health.  Define sleep hygiene, discuss sleep cycles and impact of sleep habits. Review good sleep hygiene tips.    Initial Review & Psychosocial Screening:   Quality of Life Scores:  Scores of 19 and below usually indicate a poorer quality of life in these areas.  A difference of  2-3 points is a clinically meaningful difference.  A difference of 2-3 points in the total score of the Quality of Life Index has been associated with significant improvement in overall quality of life, self-image, physical symptoms, and general health in studies assessing change in quality of life.  PHQ-9: Review Flowsheet  More data exists      07/19/2024 05/30/2024 01/19/2024 03/10/2023 10/05/2022  Depression screen PHQ 2/9  Decreased Interest 3 2 2  0 0  Down, Depressed, Hopeless 2 2 0 1 1  PHQ - 2 Score 5 4 2 1 1   Altered sleeping 3 3 2  - -  Tired, decreased energy 3 3 3  - -  Change in appetite 3 3 2  - -  Feeling bad or failure about yourself  2 2 2  - -  Trouble concentrating 1 1 1  - -  Moving slowly or fidgety/restless 1 1 3  - -  Suicidal thoughts 0 0 0 - -  PHQ-9 Score 18 17 15  - -  Difficult doing work/chores Very difficult Very difficult Extremely dIfficult - -   Interpretation of Total Score  Total Score Depression Severity:  1-4 = Minimal depression, 5-9 = Mild depression, 10-14 = Moderate depression, 15-19 = Moderately severe depression, 20-27 = Severe depression   Psychosocial Evaluation and Intervention:   Psychosocial Re-Evaluation:  Psychosocial Re-Evaluation     Row Name 07/19/24 1600             Psychosocial Re-Evaluation   Current  issues with Current Stress Concerns       Comments Reviewed patient health questionnaire (PHQ-9) with patient for follow up. Previously, patients score indicated signs/symptoms of depression.  Reviewed to see if patient is improving symptom wise while in program.  Score declined and patient states that it is because he has been getting worse with his lung issues. He is not able to do the things that he used to do and finds himself short of breath alot.       Expected Outcomes Short: Continue to work toward an improvement in PHQ9 scores by attending [facility] regularly. Long: Continue to improve stress and depression coping skills by talking with staff and attending [facility] regularly and work toward a positive mental state.       Interventions Encouraged to attend Pulmonary Rehabilitation for the exercise       Continue Psychosocial Services  Follow up required by staff          Psychosocial Discharge (Final Psychosocial Re-Evaluation):  Psychosocial Re-Evaluation - 07/19/24 1600       Psychosocial Re-Evaluation   Current issues with Current Stress Concerns    Comments Reviewed patient health questionnaire (PHQ-9) with patient for follow up. Previously, patients score indicated signs/symptoms of depression.  Reviewed to see if patient is improving symptom wise while in program.  Score declined and patient states that it is because he has been getting worse with his lung issues. He is not able to do the things that he used to do and finds himself short of breath alot.    Expected Outcomes Short: Continue to work toward an improvement in PHQ9 scores by attending [facility] regularly. Long: Continue to improve stress and depression coping skills by talking with staff and attending [facility] regularly and work toward a positive mental state.    Interventions Encouraged to attend Pulmonary Rehabilitation for the exercise    Continue Psychosocial Services  Follow up required by staff           Education: Education Goals: Education classes will be provided on a weekly basis, covering required topics. Participant will state understanding/return demonstration of topics presented.  Learning Barriers/Preferences:   General Pulmonary Education Topics:  Infection Prevention: - Provides verbal and written material to individual with discussion of infection control including proper hand washing and proper equipment cleaning during exercise session. Flowsheet Row Pulmonary Rehab from 05/30/2024 in [facility] Card[facility] Pulmonary Rehab  Date 05/30/24  Educator [facility]  Instruction Review Code 1- Verbalizes Understanding    Falls Prevention: - Provides verbal and written material to individual with discussion of falls prevention and safety. Flowsheet Row Pulmonary Rehab from 05/30/2024 in [facility] Cardiac and Pulmonary Rehab  Date 05/30/24  Educator [facility]  Instruction Review Code 1- Verbalizes Understanding    Chronic Lung Disease Review: - Group verbal instruction with posters, models, PowerPoint presentations and videos,  to review new updates, new respiratory medications, new advancements in procedures and treatments. Providing information on websites and 800 numbers for continued self-education. Includes information about supplement oxygen, available portable oxygen systems, continuous and intermittent flow rates, oxygen safety, concentrators, and Medicare reimbursement for oxygen. Explanation of Pulmonary Drugs, including class, frequency, complications, importance of spacers, rinsing mouth after steroid MDI's, and proper cleaning methods for nebulizers. Review of basic lung anatomy and physiology related to function, structure, and complications of  lung disease. Review of risk factors. Discussion about methods for diagnosing sleep apnea and types of masks and machines for OSA. Includes a review of the use of types of environmental controls: home humidity, furnaces, filters, dust mite/pet  prevention, HEPA vacuums. Discussion about weather changes, air quality and the benefits of nasal washing. Instruction on Warning signs, infection symptoms, calling MD promptly, preventive modes, and value of vaccinations. Review of effective airway clearance, coughing and/or vibration techniques. Emphasizing that all should Create an Action Plan. Written material provided at class time. Flowsheet Row Pulmonary Rehab from 01/26/2024 in [facility] Cardiac and Pulmonary Rehab  Education need identified 01/26/24    AED/CPR: - Group verbal and written instruction with the use of models to demonstrate the basic use of the AED with the basic ABC's of resuscitation.    Tests and Procedures:  - Group verbal and visual presentation and models provide information about basic cardiac anatomy and function. Reviews the testing methods done to diagnose heart disease and the outcomes of the test results. Describes the treatment choices: Medical Management, Angioplasty, or Coronary Bypass Surgery for treating various heart conditions including Myocardial Infarction, Angina, Valve Disease, and Cardiac Arrhythmias.  Written material provided at class time.   Medication Safety: - Group verbal and visual instruction to review commonly prescribed medications for heart and lung disease. Reviews the medication, class of the drug, and side effects. Includes the steps to properly store meds and maintain the prescription regimen.  Written material given at graduation.   Other: -Provides group and verbal instruction on various topics (see comments)   Knowledge Questionnaire Score:  Knowledge Questionnaire Score - 06/19/24 0842       Knowledge Questionnaire Score   Pre Score 14/18           Core Components/Risk Factors/Patient Goals at Admission:  Personal Goals and Risk Factors at Admission - 05/30/24 1526       Core Components/Risk Factors/Patient Goals on Admission   Tobacco Cessation Yes    Number of packs  per day <2 ppd    Intervention Assist the participant in steps to quit. Provide individualized education and counseling about committing to Tobacco Cessation, relapse prevention, and pharmacological support that can be provided by physician.;Education officer, environmental, assist with locating and accessing local/national Quit Smoking programs, and support quit date choice.    Expected Outcomes Short Term: Will demonstrate readiness to quit, by selecting a quit date.;Short Term: Will quit all tobacco product use, adhering to prevention of relapse plan.;Long Term: Complete abstinence from all tobacco products for at least 12 months from quit date.    Improve shortness of breath with ADL's Yes    Intervention Provide education, individualized exercise plan and daily activity instruction to help decrease symptoms of SOB with activities of daily living.    Expected Outcomes Short Term: Improve cardiorespiratory fitness to achieve a reduction of symptoms when performing ADLs;Long Term: Be able to perform more ADLs without symptoms or delay the onset of symptoms    Hypertension Yes    Intervention Provide education on lifestyle modifcations including regular physical activity/exercise, weight management, moderate sodium restriction and increased consumption of fresh fruit, vegetables, and low fat dairy, alcohol moderation, and smoking cessation.;Monitor prescription use compliance.    Expected Outcomes Short Term: Continued assessment and intervention until BP is < 140/66mm HG in hypertensive participants. < 130/17mm HG in hypertensive participants with diabetes, heart failure or chronic kidney disease.;Long Term: Maintenance of blood pressure at goal levels.    Lipids  Yes    Intervention Provide education and support for participant on nutrition & aerobic/resistive exercise along with prescribed medications to achieve LDL 70mg , HDL >40mg .    Expected Outcomes Short Term: Participant states understanding of  desired cholesterol values and is compliant with medications prescribed. Participant is following exercise prescription and nutrition guidelines.;Long Term: Cholesterol controlled with medications as prescribed, with individualized exercise RX and with personalized nutrition plan. Value goals: LDL < 70mg , HDL > 40 mg.          Education:Diabetes - Individual verbal and written instruction to review signs/symptoms of diabetes, desired ranges of glucose level fasting, after meals and with exercise. Acknowledge that pre and post exercise glucose checks will be done for 3 sessions at entry of program.   Know Your Numbers and Heart Failure: - Group verbal and visual instruction to discuss disease risk factors for cardiac and pulmonary disease and treatment options.  Reviews associated critical values for Overweight/Obesity, Hypertension, Cholesterol, and Diabetes.  Discusses basics of heart failure: signs/symptoms and treatments.  Introduces Heart Failure Zone chart for action plan for heart failure. Written material provided at class time.   Core Components/Risk Factors/Patient Goals Review:   Goals and Risk Factor Review     Row Name 07/19/24 1554             Core Components/Risk Factors/Patient Goals Review   Personal Goals Review Improve shortness of breath with ADL's       Review Spoke to patient about their shortness of breath and what they can do to improve. Patient has been informed of breathing techniques when starting the program. Patient is informed to tell staff if they have had any med changes and that certain meds they are taking or not taking can be causing shortness of breath.       Expected Outcomes Short: Attend [facility] regularly to improve shortness of breath with ADL's. Long: maintain independence with ADL's          Core Components/Risk Factors/Patient Goals at Discharge (Final Review):   Goals and Risk Factor Review - 07/19/24 1554       Core Components/Risk  Factors/Patient Goals Review   Personal Goals Review Improve shortness of breath with ADL's    Review Spoke to patient about their shortness of breath and what they can do to improve. Patient has been informed of breathing techniques when starting the program. Patient is informed to tell staff if they have had any med changes and that certain meds they are taking or not taking can be causing shortness of breath.    Expected Outcomes Short: Attend [facility] regularly to improve shortness of breath with ADL's. Long: maintain independence with ADL's          ITP Comments:  ITP Comments     Row Name 05/30/24 1516 06/11/24 1404 06/27/24 0945 07/25/24 1409     ITP Comments Completed and gym orientation for pulmonary rehab. Initial ITP created and sent for review to Dr. Faud Aleskerov, Medical Director. First full day of exercise!  Patient was oriented to gym and equipment including functions, settings, policies, and procedures.  Patient's individual exercise prescription and treatment plan were reviewed.  All starting workloads were established based on the results of the 6 minute walk test done at initial orientation visit.  The plan for exercise progression was also introduced and progression will be customized based on patient's performance and goals. 30 Day review completed. Medical Director ITP review done; changes made as directed and  signed approval by Medical Director. New to program. 30 Day review completed. Medical Director ITP review done; changes made as directed and signed approval by Medical Director.       Comments: 30 day review

## 2024-07-26 ENCOUNTER — Encounter

## 2024-07-30 ENCOUNTER — Encounter

## 2024-08-01 NOTE — Progress Notes (Signed)
   08/02/2024  11:51 AM   Paul Carpenter June 04, 1956 [id number]  Referring provider: Sadie Manna, MD [address] [facility] [place],  [place] [address]  Urological history: 1. ED -contributing factors of age, BPH, HTN, CAD, hypothyroidism, smoking, COPD,  BP pills, depression and anxiety -Testosterone  (12/2022) 532 -Tadalafil  5 mg daily  2. BPH with LU TS -PSA (12/2022) 1.1 -cysto 08/2020 - moderate bilateral lobe enlargement  -Tadalafil  5 mg daily  3. Nephrolithiasis -5 mm left renal stone on 06/02/2021  No chief complaint on file.   HPI: Paul Carpenter is a 68 y.o.  male who presents today for 12 month follow up.  Previous records reviewed.   UA (01/2024) benign  PSA (10/2023) 1.58  Serum creatinine (06/2024) 0.71, eGFR > 60   Hemoglobin A1c (01/2024) 5.2  Testosterone  level (12/2022) 532  Cholesterol (01/2024) 152  TSH (01/2024) 3.547    Laboratory Data: See EPIC and HPI I have reviewed the labs.   Pertinent Imaging N/A   CLOTILDA CORNWALL, PA-C  [facility] [address], [address] [place], [place] [address] [phone]  Patient left before being seen, had another appointment, he would not reschedule.

## 2024-08-02 ENCOUNTER — Encounter: Payer: Self-pay | Admitting: Urology

## 2024-08-02 ENCOUNTER — Ambulatory Visit: Admitting: Pulmonary Disease

## 2024-08-02 ENCOUNTER — Encounter: Attending: Pulmonary Disease | Admitting: Emergency Medicine

## 2024-08-02 ENCOUNTER — Encounter: Payer: Self-pay | Admitting: Oncology

## 2024-08-02 ENCOUNTER — Encounter: Payer: Self-pay | Admitting: Pulmonary Disease

## 2024-08-02 ENCOUNTER — Other Ambulatory Visit: Payer: Self-pay

## 2024-08-02 ENCOUNTER — Ambulatory Visit (INDEPENDENT_AMBULATORY_CARE_PROVIDER_SITE_OTHER): Payer: Self-pay | Admitting: Urology

## 2024-08-02 ENCOUNTER — Ambulatory Visit: Admitting: Urology

## 2024-08-02 VITALS — BP 142/84 | HR 94 | Ht 72.0 in | Wt 162.0 lb

## 2024-08-02 VITALS — BP 126/80 | HR 76 | Temp 97.6°F | Ht 72.0 in | Wt 165.2 lb

## 2024-08-02 DIAGNOSIS — J449 Chronic obstructive pulmonary disease, unspecified: Secondary | ICD-10-CM | POA: Diagnosis present

## 2024-08-02 DIAGNOSIS — N486 Induration penis plastica: Secondary | ICD-10-CM

## 2024-08-02 DIAGNOSIS — I5020 Unspecified systolic (congestive) heart failure: Secondary | ICD-10-CM

## 2024-08-02 DIAGNOSIS — A310 Pulmonary mycobacterial infection: Secondary | ICD-10-CM | POA: Diagnosis not present

## 2024-08-02 DIAGNOSIS — N401 Enlarged prostate with lower urinary tract symptoms: Secondary | ICD-10-CM

## 2024-08-02 DIAGNOSIS — F17210 Nicotine dependence, cigarettes, uncomplicated: Secondary | ICD-10-CM

## 2024-08-02 DIAGNOSIS — G4736 Sleep related hypoventilation in conditions classified elsewhere: Secondary | ICD-10-CM

## 2024-08-02 DIAGNOSIS — N529 Male erectile dysfunction, unspecified: Secondary | ICD-10-CM

## 2024-08-02 DIAGNOSIS — N138 Other obstructive and reflux uropathy: Secondary | ICD-10-CM

## 2024-08-02 DIAGNOSIS — J439 Emphysema, unspecified: Secondary | ICD-10-CM

## 2024-08-02 MED ORDER — COMPRESSOR/NEBULIZER MISC
0 refills | Status: AC
  Filled 2024-08-02: qty 1, 1d supply, fill #0

## 2024-08-02 MED ORDER — COMPRESSOR/NEBULIZER MISC: 0 refills | Status: DC

## 2024-08-02 NOTE — Progress Notes (Signed)
 Subjective:    Patient ID: Paul Carpenter, male    DOB: 1955/12/10, 68 y.o.   MRN: [id number]  Patient Care Team: Fayette Bodily, MD as PCP - General (Infectious Diseases) Dew, Selinda RAMAN, MD as Referring Physician (Vascular Surgery) Jacobo Evalene PARAS, MD as Consulting Physician (Oncology) Lenn Aran, MD as Referring Physician (Radiation Oncology) Tamea Dedra CROME, MD as Consulting Physician (Pulmonary Disease) Fayette Bodily, MD as Consulting Physician (Infectious Diseases) Katie Vinie LOISE, MD as Referring Physician (Pulmonary Disease)  Chief Complaint  Patient presents with   COPD    Patient reports shortness of breath is worse, since last visit. Cough with phlegm.     BACKGROUND/INTERVAL:Paul Carpenter is a very complex 68 year old current smoker (1.5 PPD, 156 PY) with a history as noted below, who presents for follow-up of a cavitary right upper lobe process in the setting of history of non-small cell carcinoma of the lung, Mycobacterium avium complex infection, stage IV COPD and chronic dyspnea/fatigue.  This is a scheduled visit.  He was last seen 10 May 2024, he was also followed at [facility] at the pulmonary specialty clinic for management of his resistant MAC.  MAC medications were discontinued due to persistence of disease and nonresponse.  He is up to 1.5 packs of cigarettes per day smoking.   HPI Discussed the use of AI scribe software for clinical note transcription with the patient, who gave verbal consent to proceed.  History of Present Illness   Paul Carpenter is a 68 year old male with advanced COPD and chronic systolic heart failure who presents with increased shortness of breath and coughing.  He presents with his wife Paul Carpenter.  He experiences increased shortness of breath and frequent coughing, which have worsened recently. He wakes up at night struggling to breathe despite using oxygen and sometimes requires a rescue inhaler during these episodes. His  oxygen saturation dropped to 76% when he attempted to sleep without oxygen during a recent [place] the mountains.  His cough produces sputum that varies from clear to dark green and occasionally contains blood. He has a history of bronchiectasis due to chronic MAC infection and recurrent issues with minor hemoptysis.  He has not had any fevers, chills or sweats.  He has a history of COPD and chronic systolic heart failure. He continues to smoke 'a little bit of a pack' daily. He uses oxygen therapy, currently set at two liters, but experiences difficulty breathing when waking from naps.  He has a history of mycobacterium medium complex infection and has previously been on antibiotics, which were stopped due to adverse effects and lack of efficacy. Inhaled Arikayce  caused significant bleeding and hemoptysis and had to be discontinued.  He failed clofazimine  as well and developed cardiac side effects with the medication (decreased EF).  His nebulizer machine is 67 years old, and he is in need of a replacement.  His MAC therapy included ethambutol  and azithromycin  since June 2023 with rifampin  added in September 2023.  Had IV amikacin  started in July 2024 stopped in March 2025 due to decreased hearing acuity.  Inability to tolerate Arikayce  documented on 20 April 2024.  Ethambutol  and azithromycin  discontinued 12 July 2024 due to lack of efficacy and patient's intolerance to side effects.    Review of Systems A 10 point review of systems was performed and it is as noted above otherwise negative.   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/22/2021   Overweight (BMI 25.0-29.9) 09/22/2021   Hypomagnesemia 09/21/2021   Elevated hemoglobin A1c 06/19/2020   Hypothyroidism, acquired 05/30/2019   Squamous cell lung cancer, right (HCC) 05/30/2019   HFrEF (heart failure with reduced ejection fraction) (HCC) 03/06/2019   Atherosclerosis 12/14/2018   Non-small cell lung cancer, right (HCC) 04/24/2018   Oral ulcer 02/16/2018   Pituitary disorder 02/16/2018   Migraine headache 03/07/2017   HCAP (healthcare-associated pneumonia) 09/11/2016   COPD exacerbation (HCC) 09/11/2016   Chronic hyponatremia 09/11/2016   Leukocytosis 09/11/2016   Thrombocytopenia 09/11/2016   Cigarette smoker 06/11/2016   Cervical radiculopathy 04/15/2016   Cervical disc disorder at C5-C6 level with radiculopathy 03/09/2016   Impingement syndrome of right shoulder 03/09/2016   Health care maintenance 09/29/2015   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   Benign essential hypertension 05/28/2015   Polycythemia 03/24/2015   Carotid artery disease 12/12/2014   Disequilibrium 12/12/2014   Mixed hyperlipidemia 12/10/2014   Incomplete emptying of bladder 06/04/2014   Anxiety 05/18/2014   Chronic coronary artery disease 05/18/2014   Chronic headaches 05/18/2014   Acute shoulder pain 03/15/2014   Impingement syndrome of left shoulder 03/15/2014   Lung mass 12/06/2013   Kidney stone 11/24/2013   COPD (chronic obstructive pulmonary disease) (HCC) 04/24/2013   Tobacco use disorder 04/24/2013   Obstructive sleep apnea 04/24/2013   Benign localized prostatic hyperplasia with lower urinary tract symptoms (LUTS) 07/04/2012   Encounter for long-term current use of medication 07/04/2012   ED (erectile dysfunction) of organic origin 07/04/2012   Testicular hypofunction 07/04/2012    Social History    Tobacco Use   Smoking status: Every Day    Current packs/day: 1.50    Average packs/day: 3.0 packs/day for 52.8 years (156.6 ttl pk-yrs)    Types: Cigarettes    Start date: 1973    Passive exposure: Current   Smokeless tobacco: Never  Substance Use Topics   Alcohol use: Not Currently    Alcohol/week: 2.0 standard drinks of alcohol    Types: 2 Standard drinks or equivalent per week    Comment: moderate    Allergies  Allergen Reactions   Lisinopril Rash   Varenicline  Rash    Current Meds  Medication Sig   acetaminophen  (TYLENOL ) 500 MG tablet Take 500 mg by mouth daily as needed.   albuterol  (PROVENTIL ) (2.5 MG/3ML) 0.083% nebulizer solution Take 3 mLs (2.5 mg total) by nebulization 4 (four) times daily as needed for wheezing or shortness of breath.   albuterol  (VENTOLIN  HFA) 108 (90 Base) MCG/ACT inhaler INHALE 2 PUFFS BY MOUTH EVERY 4 HOURS AS NEEDED FOR WHEEZE OR FOR SHORTNESS OF BREATH   ammonium lactate (LAC-HYDRIN) 12 % lotion Apply topically.   arformoterol  (BROVANA ) 15 MCG/2ML NEBU Take 2 mLs (15 mcg total) by nebulization 2 (two) times daily. Dx: J44.9   aspirin EC 81 MG tablet Take 81 mg by mouth daily. Swallow whole.   atorvastatin  (LIPITOR) 10 MG tablet Take 10 mg by mouth daily.   azithromycin  (ZITHROMAX ) 500 MG tablet TAKE 1 TABLET (500 MG TOTAL) BY MOUTH DAILY.   budesonide  (  PULMICORT ) 0.5 MG/2ML nebulizer solution Take 2 mLs (0.5 mg total) by nebulization 2 (two) times daily.   chlorpheniramine-HYDROcodone  (TUSSIONEX) 10-8 MG/5ML Take 5 mLs by mouth every 12 (twelve) hours as needed for cough.   clotrimazole  (MYCELEX ) 10 MG troche Take 10 mg by mouth 3 (three) times daily.   cyanocobalamin (VITAMIN B12) 1000 MCG/ML injection Inject 1 mL (1,000 mcg total) into the muscle monthly for 180 days   diazepam  (VALIUM ) 5 MG tablet Take 5 mg by mouth at bedtime as needed.   ethambutol  (MYAMBUTOL ) 400 MG tablet Take 3 tablets (1,200 mg total) by mouth daily.   ferrous  sulfate 325 (65 FE) MG tablet Take 325 mg by mouth at bedtime.   Fluocinolone Acetonide 0.01 % OIL PLEASE SEE ATTACHED FOR DETAILED DIRECTIONS   folic acid  (FOLVITE ) 1 MG tablet Take 1 mg by mouth daily.   guaiFENesin  (MUCINEX ) 600 MG 12 hr tablet Take 1 tablet (600 mg total) by mouth 2 (two) times daily.   hydrocortisone  (CORTEF ) 10 MG tablet Take 2 tablets in the morning and 1 tablet mid afternoon (2 to 4 PM)   levothyroxine  (SYNTHROID ) 175 MCG tablet Take 175 mcg by mouth daily before breakfast.   lidocaine -prilocaine  (EMLA ) cream APPLY 1 APPLICATION TOPICALLY AS NEEDED.   magnesium  oxide (MAG-OX) 400 MG tablet Take 800 mg by mouth 2 (two) times daily.   melatonin 3 MG TABS tablet Take 3 mg by mouth at bedtime as needed.   naproxen  sodium (ANAPROX  DS) 550 MG tablet Take 1 tablet (550 mg total) by mouth 2 (two) times daily as needed for moderate pain (pain score 4-6). Take with meals   nicotine  (NICODERM CQ  - DOSED IN MG/24 HOURS) 21 mg/24hr patch Place 21 mg onto the skin daily.   nitroGLYCERIN  (NITROSTAT ) 0.4 MG SL tablet Place under the tongue.   OXYGEN Inhale 2 L into the lungs at bedtime.   pantoprazole  (PROTONIX ) 40 MG tablet Take 1 tablet (40 mg total) by mouth 2 (two) times daily before meals   promethazine -dextromethorphan  (PROMETHAZINE -DM) 6.25-15 MG/5ML syrup Take 5 mLs by mouth 4 (four) times daily as needed for cough.   sodium chloride  1 g tablet Take 5 tablets (5 g total) by mouth 3 (three) times daily with meals.   tadalafil  (CIALIS ) 5 MG tablet Take 1 tablet (5 mg total) by mouth daily as needed for erectile dysfunction.   traMADol  (ULTRAM ) 50 MG tablet Take 50 mg by mouth every 6 (six) hours as needed.   YUPELRI  175 MCG/3ML nebulizer solution TAKE 3 MLS (175 MCG TOTAL) BY NEBULIZATION DAILY. DX: J44.9   [DISCONTINUED] Nebulizers (COMPRESSOR/NEBULIZER) MISC Use as directed for nebulizer treatments.   Nebulizers (COMPRESSOR/NEBULIZER) MISC Use as directed for nebulized  medications.    Immunization History  Administered Date(s) Administered   Fluad Quad(high Dose 65+) 09/22/2020   Influenza Inj Mdck Quad Pf 09/06/2016, 10/02/2019, 09/09/2021, 09/03/2022   Influenza Split 08/05/2013, 07/23/2014, 08/18/2015   Influenza Whole 08/01/2012   Influenza,inj,Quad PF,6+ Mos 08/14/2018   Influenza-Unspecified 09/06/2016, 08/17/2017, 09/09/2021   PNEUMOCOCCAL CONJUGATE-20 02/08/2024   Zoster Recombinant(Shingrix) 06/19/2020        Objective:     BP 126/80   Pulse 76   Temp 97.6 F (36.4 C) (Temporal)   Ht 6' (1.829 m)   Wt 165 lb 3.2 oz (74.9 kg)   SpO2 98%   BMI 22.41 kg/m   SpO2: 98 %  GENERAL: Chronically ill-appearing man, mild tachypnea.  No conversational dyspnea.  Awake and alert.  HEAD: Normocephalic, atraumatic.  EYES: Pupils equal, round, reactive to light.  No scleral icterus.  MOUTH: Oral mucosa moist.  No thrush. NECK: Supple. No thyromegaly. Trachea midline. No JVD.  No adenopathy. PULMONARY: Good air entry bilaterally. Amphoric sounds on the right upper lung zone.  Coarse, without rhonchi or wheezes noted today.   CARDIOVASCULAR: S1 and S2. Regular rate and rhythm.  No rubs, murmurs or gallops heard. ABDOMEN: Benign. MUSCULOSKELETAL: No joint deformity, no clubbing, no edema.  NEUROLOGIC: No overt focal deficit. Speech is fluent. SKIN: Intact,warm,dry. PSYCH: Depressed mood and flat affect.        Assessment & Plan:     ICD-10-CM   1. Stage 4 very severe COPD by GOLD classification (HCC)  J44.9     2. Nocturnal hypoxemia due to emphysema (HCC)  J43.9 Overnight Pulse Oximetry Study   G47.36     3. Mycobacterium avium complex (HCC) - resistant  A31.0     4. HFrEF (heart failure with reduced ejection fraction) (HCC)  I50.20     5. Tobacco dependence due to cigarettes  F17.210       Orders Placed This Encounter  Procedures   Overnight Pulse Oximetry Study    Standing Status:   Future    Expiration Date:   08/02/2025     Scheduling Instructions:     On 2L oxygen.    Meds ordered this encounter  Medications   Nebulizers (COMPRESSOR/NEBULIZER) MISC    Sig: Use as directed for nebulized medications.    Dispense:  1 each    Refill:  0   Discussion:    Chronic obstructive pulmonary disease (COPD) with bronchiectasis and supplemental oxygen dependence Advanced COPD with increased coughing, shortness of breath, and nocturnal dyspnea despite supplemental oxygen. Oxygen saturation dropped to 76% during sleep without oxygen. Sputum varies from clear to dark green, occasionally with blood, likely due to bronchiectasis. Current oxygen therapy may need adjustment to prevent hypoxemia and hypercapnia. - Order overnight oxygen level monitoring to assess nocturnal oxygenation - Arrange for a new nebulizer machine due to malfunction of the current one - Conduct a walking test to evaluate oxygen levels during exertion  Mycobacterium avium complex (MAC) pulmonary infection MAC pulmonary infection with main nodules and new inflammatory changes. Previous treatment with inhaled Avocase was poorly tolerated due to bleeding. Awaiting results from upcoming chest CT to determine progression and need for antibiotic therapy. Potential for alternative oral antibiotics if progression is noted. - Review chest CT with contrast scheduled for October 28 to assess for progression of MAC infection - Consider alternative oral antibiotics if progression is noted  Chronic systolic heart failure Chronic systolic heart failure with a significant drop in heart function on recent echocardiogram, necessitating further evaluation. - Order echocardiogram to assess current heart function     Advised if symptoms do not improve or worsen, to please contact office for sooner follow up or seek emergency care.    I spent 42 minutes of dedicated to the care of this patient on the date of this encounter to include pre-visit review of records,  face-to-face time with the patient discussing conditions above, post visit ordering of testing, clinical documentation with the electronic health record, making appropriate referrals as documented, and communicating necessary findings to members of the patients care team.     C. Leita Sanders, MD Advanced Bronchoscopy PCCM [facility]    *This note was generated using voice recognition software/Dragon and/or AI transcription program.  Despite best efforts to  proofread, errors can occur which can change the meaning. Any transcriptional errors that result from this process are unintentional and may not be fully corrected at the time of dictation.

## 2024-08-02 NOTE — Patient Instructions (Signed)
 VISIT SUMMARY:  Today, we discussed your increased shortness of breath and coughing, which have worsened recently. We reviewed your history of COPD, chronic systolic heart failure, and mycobacterium avium complex (MAC) pulmonary infection. We also talked about your current oxygen therapy and the need for a new nebulizer machine.  YOUR PLAN:  -CHRONIC OBSTRUCTIVE PULMONARY DISEASE (COPD) WITH BRONCHIECTASIS AND SUPPLEMENTAL OXYGEN DEPENDENCE: COPD is a chronic lung disease that makes it hard to breathe, and bronchiectasis is a condition where the bronchial tubes in your lungs are permanently enlarged. We will monitor your overnight oxygen levels to ensure you are getting enough oxygen while you sleep, arrange for a new nebulizer machine, and conduct a walking test to evaluate your oxygen levels during exertion.  -MYCOBACTERIUM AVIUM COMPLEX (MAC) PULMONARY INFECTION: MAC is a type of bacterial infection that affects the lungs. We are waiting for the results of your chest CT scheduled for October 28 to see if the infection has progressed. If it has, we may consider alternative oral antibiotics.  -CHRONIC SYSTOLIC HEART FAILURE: Chronic systolic heart failure is a condition where the heart doesn't pump blood as well as it should. We will order an echocardiogram to assess your current heart function.  INSTRUCTIONS:  Please follow up with the overnight oxygen level monitoring, and ensure you get the new nebulizer machine. Attend the walking test to evaluate your oxygen levels during exertion. Review the chest CT results with us  after your scan on October 28. We will also schedule an echocardiogram to assess your heart function.

## 2024-08-02 NOTE — Progress Notes (Signed)
 Daily Session Note  Patient Details  Name: Paul Carpenter MRN: [id number] Date of Birth: 1956/09/20 Referring Provider:   [facility] from 05/30/2024 in [facility] Card[facility] Pul[facility] Rehab  Referring Provider Dr. Dedra Sanders    Encounter Date: 08/02/2024  Check In:  Session Check In - 08/02/24 1535       Check-In   Supervising physician immediately available to respond to emergencies See telemetry face sheet for immediately available ER MD    Location [facility]-Cardiac & Pulmonary Rehab    Staff Present Devaughn Jaeger, BS, Exercise Physiologist;Geraldyn Shain RN,BSN;Maxon Champ BS, Exercise Physiologist;Joseph Rolinda RCP,RRT,BSRT    Virtual Visit No    Medication changes reported     No    Fall or balance concerns reported    No    Tobacco Cessation No Change    Warm-up and Cool-down Performed on first and last piece of equipment    Resistance Training Performed Yes    VAD Patient? No    PAD/SET Patient? No      Pain Assessment   Currently in Pain? No/denies             Social History   Tobacco Use  Smoking Status Every Day   Current packs/day: 1.50   Average packs/day: 3.0 packs/day for 52.8 years (156.6 ttl pk-yrs)   Types: Cigarettes   Start date: 1973   Passive exposure: Current  Smokeless Tobacco Never    Goals Met:  Proper associated with RPD/PD & O2 Sat Independence with exercise equipment Using PLB without cueing & demonstrates good technique Exercise tolerated well No report of concerns or symptoms today Strength training completed today  Goals Unmet:  Not Applicable  Comments: Pt able to follow exercise prescription today without complaint.  Will continue to monitor for progression.    Dr. Oneil Pinal is Medical Director for [facility] Cardiac Rehabilitation.  Dr. Fuad Aleskerov is Medical Director for [facility] Pulmonary Rehabilitation.

## 2024-08-03 ENCOUNTER — Other Ambulatory Visit (INDEPENDENT_AMBULATORY_CARE_PROVIDER_SITE_OTHER): Payer: Self-pay | Admitting: Nurse Practitioner

## 2024-08-03 DIAGNOSIS — I82B11 Acute embolism and thrombosis of right subclavian vein: Secondary | ICD-10-CM

## 2024-08-05 NOTE — Progress Notes (Signed)
 Error

## 2024-08-06 ENCOUNTER — Encounter: Admitting: Emergency Medicine

## 2024-08-06 DIAGNOSIS — J449 Chronic obstructive pulmonary disease, unspecified: Secondary | ICD-10-CM | POA: Diagnosis not present

## 2024-08-06 NOTE — Progress Notes (Signed)
 Daily Session Note  Patient Details  Name: JAKOBIE HENSLEE MRN: [id number] Date of Birth: 06-Mar-1956 Referring Provider:   [facility] from 05/30/2024 in [facility] Cardiac and Pulmonary Rehab  Referring Provider Dr. Dedra Sanders    Encounter Date: 08/06/2024  Check In:  Session Check In - 08/06/24 1351       Check-In   Supervising physician immediately available to respond to emergencies See telemetry face sheet for immediately available ER MD    Location [facility]-Cardiac & Pulmonary Rehab    Staff Present Leita Franks RN,BSN;Joseph [facility] BS, Exercise Physiologist;Margaret Best, MS, Exercise Physiologist    Virtual Visit No    Medication changes reported     No    Fall or balance concerns reported    No    Tobacco Cessation No Change    Warm-up and Cool-down Performed on first and last piece of equipment    Resistance Training Performed Yes    VAD Patient? No    PAD/SET Patient? No      Pain Assessment   Currently in Pain? No/denies             Social History   Tobacco Use  Smoking Status Every Day   Current packs/day: 1.50   Average packs/day: 3.0 packs/day for 52.8 years (156.6 ttl pk-yrs)   Types: Cigarettes   Start date: 1973   Passive exposure: Current  Smokeless Tobacco Never    Goals Met:  Proper associated with RPD/PD & O2 Sat Independence with exercise equipment Using PLB without cueing & demonstrates good technique Exercise tolerated well No report of concerns or symptoms today Strength training completed today  Goals Unmet:  Not Applicable  Comments: Pt able to follow exercise prescription today without complaint.  Will continue to monitor for progression.    Dr. Oneil Pinal is Medical Director for [facility] Cardiac Rehabilitation.  Dr. Fuad Aleskerov is Medical Director for [facility] Pulmonary Rehabilitation.

## 2024-08-07 ENCOUNTER — Encounter: Payer: Self-pay | Admitting: Infectious Diseases

## 2024-08-07 ENCOUNTER — Ambulatory Visit: Admitting: Infectious Diseases

## 2024-08-07 ENCOUNTER — Ambulatory Visit (INDEPENDENT_AMBULATORY_CARE_PROVIDER_SITE_OTHER)

## 2024-08-07 ENCOUNTER — Encounter (INDEPENDENT_AMBULATORY_CARE_PROVIDER_SITE_OTHER): Payer: Self-pay | Admitting: Vascular Surgery

## 2024-08-07 ENCOUNTER — Ambulatory Visit (INDEPENDENT_AMBULATORY_CARE_PROVIDER_SITE_OTHER): Admitting: Vascular Surgery

## 2024-08-07 ENCOUNTER — Ambulatory Visit: Attending: Infectious Diseases | Admitting: Infectious Diseases

## 2024-08-07 VITALS — BP 142/75 | HR 88 | Resp 18 | Ht 72.0 in | Wt 165.0 lb

## 2024-08-07 VITALS — BP 143/82 | HR 87 | Temp 98.1°F | Ht 72.0 in | Wt 168.0 lb

## 2024-08-07 DIAGNOSIS — Z8249 Family history of ischemic heart disease and other diseases of the circulatory system: Secondary | ICD-10-CM | POA: Diagnosis not present

## 2024-08-07 DIAGNOSIS — I82B11 Acute embolism and thrombosis of right subclavian vein: Secondary | ICD-10-CM

## 2024-08-07 DIAGNOSIS — Z7951 Long term (current) use of inhaled steroids: Secondary | ICD-10-CM | POA: Insufficient documentation

## 2024-08-07 DIAGNOSIS — H9109 Ototoxic hearing loss, unspecified ear: Secondary | ICD-10-CM

## 2024-08-07 DIAGNOSIS — E271 Primary adrenocortical insufficiency: Secondary | ICD-10-CM | POA: Insufficient documentation

## 2024-08-07 DIAGNOSIS — Z9221 Personal history of antineoplastic chemotherapy: Secondary | ICD-10-CM | POA: Diagnosis not present

## 2024-08-07 DIAGNOSIS — E782 Mixed hyperlipidemia: Secondary | ICD-10-CM | POA: Diagnosis not present

## 2024-08-07 DIAGNOSIS — I10 Essential (primary) hypertension: Secondary | ICD-10-CM

## 2024-08-07 DIAGNOSIS — Z7982 Long term (current) use of aspirin: Secondary | ICD-10-CM | POA: Insufficient documentation

## 2024-08-07 DIAGNOSIS — M25512 Pain in left shoulder: Secondary | ICD-10-CM | POA: Insufficient documentation

## 2024-08-07 DIAGNOSIS — C3491 Malignant neoplasm of unspecified part of right bronchus or lung: Secondary | ICD-10-CM

## 2024-08-07 DIAGNOSIS — A310 Pulmonary mycobacterial infection: Secondary | ICD-10-CM

## 2024-08-07 DIAGNOSIS — Z923 Personal history of irradiation: Secondary | ICD-10-CM | POA: Diagnosis not present

## 2024-08-07 DIAGNOSIS — F17210 Nicotine dependence, cigarettes, uncomplicated: Secondary | ICD-10-CM | POA: Diagnosis not present

## 2024-08-07 DIAGNOSIS — Z955 Presence of coronary angioplasty implant and graft: Secondary | ICD-10-CM | POA: Diagnosis not present

## 2024-08-07 DIAGNOSIS — E222 Syndrome of inappropriate secretion of antidiuretic hormone: Secondary | ICD-10-CM | POA: Insufficient documentation

## 2024-08-07 DIAGNOSIS — J449 Chronic obstructive pulmonary disease, unspecified: Secondary | ICD-10-CM

## 2024-08-07 DIAGNOSIS — T365X5D Adverse effect of aminoglycosides, subsequent encounter: Secondary | ICD-10-CM

## 2024-08-07 DIAGNOSIS — Z792 Long term (current) use of antibiotics: Secondary | ICD-10-CM | POA: Diagnosis not present

## 2024-08-07 DIAGNOSIS — E039 Hypothyroidism, unspecified: Secondary | ICD-10-CM | POA: Diagnosis not present

## 2024-08-07 DIAGNOSIS — I509 Heart failure, unspecified: Secondary | ICD-10-CM | POA: Diagnosis not present

## 2024-08-07 DIAGNOSIS — Z85118 Personal history of other malignant neoplasm of bronchus and lung: Secondary | ICD-10-CM | POA: Insufficient documentation

## 2024-08-07 DIAGNOSIS — I871 Compression of vein: Secondary | ICD-10-CM

## 2024-08-07 DIAGNOSIS — J441 Chronic obstructive pulmonary disease with (acute) exacerbation: Secondary | ICD-10-CM | POA: Diagnosis not present

## 2024-08-07 DIAGNOSIS — I110 Hypertensive heart disease with heart failure: Secondary | ICD-10-CM | POA: Diagnosis not present

## 2024-08-07 DIAGNOSIS — J440 Chronic obstructive pulmonary disease with acute lower respiratory infection: Secondary | ICD-10-CM | POA: Diagnosis not present

## 2024-08-07 NOTE — Progress Notes (Signed)
 Paul Carpenter  DOB: 04/02/1956  MRN: [id number]  Date/Time: 08/07/2024 8:29 AM  Subjective:  Paul Carpenter is a 68 year old male with MAC infection who presents for follow-up. He is here with his wife  On 07/13/24 He stopped azithromycin  and ethambutol  after shared care decision because it was making him have poor appetite, tired and basically having Positive AFB in spite of 2 years of antibiotic HE says since hs stopped it he has not felt any better Rather he is feeling sob and has continued burning rt side of chest  On 10/2 he saw Dr.[name]-awaiting Ct chest 10/28 I doubt 2nd line MAC meds will work and would rather have more side effects as well Sputum from aug 2025 positive for MAC   on May 8th, he had his port removed due to swelling and pain in his arms, which was causing venous obstruction. On the same day, a balloon angioplasty and stent placement were performed on the right subclavian vein. Despite these interventions, he continues to experience stinging and burning sensations in the area. 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 He is not on blood thinners but takes 81 mg of aspirin daily.    He has difficulty breathing, especially at night, which may be related to his COPD. He uses home oxygen occasionally, particularly at night, and has three nebulizer medications: Upeldry once daily, Brovana  twice daily, and Pulmicort  twice daily, along with a rescue inhaler every three to four hours. He experiences low energy levels, often feeling weak and tired, and has trouble with his oxygen levels dropping in the morning.  He has a significant smoking history, currently smoking about two packs a day. He recently had an eye examination due to ethambutol  use, with no significant changes noted.  He reports pain in his left shoulder, suspecting a torn rotator cuff, which limits  his movement.    Following taken from last note Paul Carpenter is a 68 y.o. 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 His treatment regimen includes clofazimine , azithromycin , and ethambutol .  He was on rifampin  for nearly 1 1/2 yr s and stopped last year at [facility] because of side effects and he was started on clofazamine. Previously, he received amikacin  intravenously, but this was discontinued due to hearing issues. He has since been using inhaled amikacin , recommended to be taken after salbutamol, but this exacerbated his breathing difficulties, and he continues to experience persistent coughing. HE was prescribed amikacin  nebulizer but that made his breathing worse- HE was referred for pulmonary rehab and went 4 tines  Despite treatment, the MAC infection remains present, with a cavity in the lung that has not resolved. He has been on MAC treatment for over three years, and there is uncertainty about the long-term efficacy of continuing azithromycin  and ethambutol .  He has a history of COPD, which significantly contributes to his current respiratory symptoms. He experiences exacerbations of COPD, which have been more pronounced recently. His current medication regimen for COPD includes Yupelry Pulmicort , and Brovana , with albuterol  used as needed. He reports a decline in mobility and increased difficulty  with physical activities, such as gardening, where his oxygen levels dropped to 78%.  His MAC infection treatment has been adjusted due to prolonged QT changes on EKG, leading to the discontinuation of clofazimine  and rifampin . He is currently  on ethambutol  and azithromycin . Recent sputum tests from June 4th, 5th, and 6th showed two positive and one negative result.   Following taken from last note  Patient has a complicated lung history As per Dr. Jerone note he was initially diagnosed with non-small cell carcinoma of the lung in 2019. On 04/11/2018 a bronch with EBUS done and biopsy taken of subcarinal mass- it showed  - POSITIVE FOR MALIGNANT CELLS IN A NECROTIC BACKGROUND.  MRI of the brain completed on Mar 28, 2018 did not reveal any metastatic disease.  He completed radiation June 26, 2018.  He completed concurrent single agent carboplatinum on June 21, 2018.  He had a reaction to Taxol  during cycle 1 and this was discontinued.  He completed a year-long maintenance subdural level lab on June 27, 2019.  In July 2022 there was a concern for slight progression of the disease.  PET scan done on 06/03/2021 revealed a spiculated cavitary mass in the apical segment of the right upper lobe with similar SUV which was 3.5 versus 3.31-year ago.  So this was reassuring as per the radiologist.  However disease progression could not be definitely excluded.   no metastasis seen.  A CT scan done on 07/20/2021 showed continued slow evolution of a thick-walled cavitary mass in the right upper lobe which remains concerning for slow-growing neoplasm.  He had a fall and fractured his C 2 in December , 2022.  He was in [facility] at that time.  CTs chest that was done that showed a large right upper lobe soft tissue mass with similar abnormal tissue involving the right pulmonary hilum extending medially into the ipsilateral mediastinum in keeping with a reported history of primary lung cancer. In January he was hospitalized [facility] for hyponatremia.  At that time oncologist saw him and got another PET scan.  A bronchoscopy/biopsy could not be done due to C2 fracture.  A PET scan was done on 11/16/2021 and it showed similar size of the cavitary process in the right  upper lobe.  Increased size and hypermetabolism compared to the PET on 06/02/2021.  Hypermetabolic pulmonary nodules many of which were new compared to the CAT scan of 10/20/2021.    He was diagnosed to have recurrence and was started on palliative chemo with  taxotere ( 4 cycles) on 11/26/21- completed on 01/28/22 He was hospitalized  02/03/22-02/05/22 for sob and cough and fever and sputum sent which was pseudomonas, but realized after his discharge He saw Dr.[name] on 02/09/22 and received a 2 week course of cipro  for necrotizing pneumonia with pseudomonas. A repeat PET scan done on 02/23/22 showed Thick-walled cavitary mass in the right upper lobe has decreased in overall hypermetabolism, indicative of treatment response. 2. Multifocal areas of nodular consolidation and peribronchovascular nodularity with a waxing and waning appearance, suggesting combination slight improvement in metastatic disease with a superimposed atypical/fungal infectious process. 3. Mildly hypermetabolic low right paratracheal and right hilar lymph nodes, metastatic or reactive. 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 and he was referred to me for treatment  Past Medical History:  Diagnosis Date   Anginal pain    Anxiety    Asthma    Chest pain    CHF (congestive heart failure) (HCC)  Chicken pox    Complication of anesthesia    o2 dropped after neck fusion   COPD (chronic obstructive pulmonary disease) (HCC)    Coronary artery disease    Cough    chronic  clear phlegm   Dysrhythmia    palpitations   GERD (gastroesophageal reflux disease)    h/o reflux/ hoarsness   Hematochezia    Hemorrhoids    History of chickenpox    History of colon polyps    History of Helicobacter pylori infection    Hoarseness    Hypertension    Lung cancer (HCC) 05/2016   Chemo + rad tx's.    Migraines    OSA (obstructive sleep apnea)    has CPAP but does not use   Personal history of tobacco use, presenting hazards to  health 03/05/2016   Pneumonia    5/17   Raynaud disease    Raynaud disease    Raynaud's disease    Rotator cuff tear    on right   Shortness of breath dyspnea    Sleep apnea    Ulcer (traumatic) of oral mucosa     Past Surgical History:  Procedure Laterality Date   BACK SURGERY     cervical fusion x 2   CARDIAC CATHETERIZATION     CERVICAL DISCECTOMY     x 2   COLONOSCOPY     COLONOSCOPY N/A 07/25/2015   Procedure: COLONOSCOPY;  Surgeon: Gladis RAYMOND Mariner, MD;  Location: [facility] ENDOSCOPY;  Service: Endoscopy;  Laterality: N/A;   COLONOSCOPY WITH PROPOFOL  N/A 10/04/2017   Procedure: COLONOSCOPY WITH PROPOFOL ;  Surgeon: Mariner Gladis RAYMOND, MD;  Location: [facility] ENDOSCOPY;  Service: Endoscopy;  Laterality: N/A;   COLONOSCOPY WITH PROPOFOL  N/A 07/10/2020   Procedure: COLONOSCOPY WITH PROPOFOL ;  Surgeon: Toledo, Ladell POUR, MD;  Location: [facility] ENDOSCOPY;  Service: Gastroenterology;  Laterality: N/A;   ELECTROMAGNETIC NAVIGATION BROCHOSCOPY Left 06/28/2016   Procedure: ELECTROMAGNETIC NAVIGATION BRONCHOSCOPY;  Surgeon: Jorie Cha, MD;  Location: [facility] ORS;  Service: Cardiopulmonary;  Laterality: Left;   ENDOBRONCHIAL ULTRASOUND N/A 04/11/2018   Procedure: ENDOBRONCHIAL ULTRASOUND;  Surgeon: Isaiah Scrivener, MD;  Location: [facility] ORS;  Service: Cardiopulmonary;  Laterality: N/A;   ESOPHAGOGASTRODUODENOSCOPY N/A 07/25/2015   Procedure: ESOPHAGOGASTRODUODENOSCOPY (EGD);  Surgeon: Gladis RAYMOND Mariner, MD;  Location: [facility] ENDOSCOPY;  Service: Endoscopy;  Laterality: N/A;   ESOPHAGOGASTRODUODENOSCOPY (EGD) WITH PROPOFOL  N/A 07/10/2020   Procedure: ESOPHAGOGASTRODUODENOSCOPY (EGD) WITH PROPOFOL ;  Surgeon: Toledo, Ladell POUR, MD;  Location: [facility] ENDOSCOPY;  Service: Gastroenterology;  Laterality: N/A;   NASAL SINUS SURGERY     x 2    PORTA CATH INSERTION N/A 04/24/2018   Procedure: PORTA CATH INSERTION;  Surgeon: Marea Selinda RAMAN, MD;  Location: [facility] INVASIVE CV LAB;  Service: Cardiovascular;  Laterality: N/A;   PORTA  CATH REMOVAL N/A 03/08/2024   Procedure: PORTA CATH REMOVAL;  Surgeon: Marea Selinda RAMAN, MD;  Location: [facility] INVASIVE CV LAB;  Service: Cardiovascular;  Laterality: N/A;   rotator cuff surgery Right    07/2016   SEPTOPLASTY     SKIN GRAFT     UPPER EXTREMITY VENOGRAPHY Right 04/16/2024   Procedure: UPPER EXTREMITY VENOGRAPHY;  Surgeon: Marea Selinda RAMAN, MD;  Location: [facility] INVASIVE CV LAB;  Service: Cardiovascular;  Laterality: Right;    Social History   Socioeconomic History   Marital status: Married    Spouse name: Not on file   Number of children: 3   Years of education: Not on file   Highest education level: Not  on file  Occupational History   Occupation: Holiday representative Work  Tobacco Use   Smoking status: Every Day    Current packs/day: 1.50    Average packs/day: 3.0 packs/day for 52.8 years (156.6 ttl pk-yrs)    Types: Cigarettes    Start date: 1973    Passive exposure: Current   Smokeless tobacco: Never  Vaping Use   Vaping status: Never Used  Substance and Sexual Activity   Alcohol use: Not Currently    Alcohol/week: 2.0 standard drinks of alcohol    Types: 2 Standard drinks or equivalent per week    Comment: moderate   Drug use: No   Sexual activity: Not on file  Other Topics Concern   Not on file  Social History Narrative   Not on file   Social Drivers of Health   Financial Resource Strain: Low Risk  (08/06/2024)   Received from [facility]   Overall Financial Resource Strain (CARDIA)    Difficulty of Paying Living Expenses: Not hard at all  Food Insecurity: No Food Insecurity (08/06/2024)   Received from [facility]   Hunger Vital Sign    Within the past 12 months, you worried that your food would run out before you got the money to buy more.: Never true    Within the past 12 months, the food you bought just didn't last and you didn't have money to get more.: Never true  Transportation Needs: No Transportation Needs (08/06/2024)    Received from [facility] - Transportation    In the past 12 months, has lack of transportation kept you from medical appointments or from getting medications?: No    Lack of Transportation (Non-Medical): No  Physical Activity: Inactive (10/04/2023)   Received from [facility]   Exercise Vital Sign    On average, how many days per week do you engage in moderate to strenuous exercise (like a brisk walk)?: 0 days    On average, how many minutes do you engage in exercise at this level?: 0 min  Stress: No Stress Concern Present (10/04/2023)   Received from [facility] - Occupational Stress Questionnaire    Feeling of Stress : Not at all  Social Connections: Socially Integrated (11/09/2023)   Social Connection and Isolation Panel    Frequency of Communication with Friends and Family: More than three times a week    Frequency of Social Gatherings with Friends and Family: More than three times a week    Attends Religious Services: 1 to 4 times per year    Active Member of [employer] or Organizations: No    Attends Banker Meetings: 1 to 4 times per year    Marital Status: Married  Catering manager Violence: Not At Risk (11/09/2023)   Humiliation, Afraid, Rape, and Kick questionnaire    Fear of Current or Ex-Partner: No    Emotionally Abused: No    Physically Abused: No    Sexually Abused: No    Family History  Problem Relation Age of Onset   Heart disease Father    Prostate cancer Father    Heart disease Paternal Grandmother    Heart attack Maternal Grandfather 65   Kidney cancer Neg Hx    Bladder Cancer Neg Hx    Other Neg Hx        pituitary abnormality   Allergies  Allergen Reactions   Lisinopril Rash  Varenicline  Rash   I? Current Outpatient Medications  Medication Sig Dispense Refill   acetaminophen  (TYLENOL ) 500 MG tablet Take 500 mg by mouth daily as needed.     albuterol  (PROVENTIL ) (2.5  MG/3ML) 0.083% nebulizer solution Take 3 mLs (2.5 mg total) by nebulization 4 (four) times daily as needed for wheezing or shortness of breath. 75 mL 11   albuterol  (VENTOLIN  HFA) 108 (90 Base) MCG/ACT inhaler INHALE 2 PUFFS BY MOUTH EVERY 4 HOURS AS NEEDED FOR WHEEZE OR FOR SHORTNESS OF BREATH 18 each 2   arformoterol  (BROVANA ) 15 MCG/2ML NEBU Take 2 mLs (15 mcg total) by nebulization 2 (two) times daily. Dx: J44.9 120 mL 11   aspirin EC 81 MG tablet Take 81 mg by mouth daily. Swallow whole.     atorvastatin  (LIPITOR) 10 MG tablet Take 10 mg by mouth daily.     budesonide  (PULMICORT ) 0.5 MG/2ML nebulizer solution Take 2 mLs (0.5 mg total) by nebulization 2 (two) times daily. 360 mL 3   chlorpheniramine-HYDROcodone  (TUSSIONEX) 10-8 MG/5ML Take 5 mLs by mouth every 12 (twelve) hours as needed for cough. 115 mL 0   clotrimazole  (MYCELEX ) 10 MG troche Take 10 mg by mouth 3 (three) times daily.     diazepam  (VALIUM ) 5 MG tablet Take 5 mg by mouth at bedtime as needed.     ferrous sulfate  325 (65 FE) MG tablet Take 325 mg by mouth at bedtime.     Fluocinolone Acetonide 0.01 % OIL PLEASE SEE ATTACHED FOR DETAILED DIRECTIONS     folic acid  (FOLVITE ) 1 MG tablet Take 1 mg by mouth daily.     guaiFENesin  (MUCINEX ) 600 MG 12 hr tablet Take 1 tablet (600 mg total) by mouth 2 (two) times daily. 60 tablet 0   hydrocortisone  (CORTEF ) 10 MG tablet Take 2 tablets in the morning and 1 tablet mid afternoon (2 to 4 PM) 90 tablet 0   levothyroxine  (SYNTHROID ) 175 MCG tablet Take 175 mcg by mouth daily before breakfast.     lidocaine -prilocaine  (EMLA ) cream APPLY 1 APPLICATION TOPICALLY AS NEEDED. 30 g 1   magnesium  oxide (MAG-OX) 400 MG tablet Take 800 mg by mouth 2 (two) times daily.     melatonin 3 MG TABS tablet Take 3 mg by mouth at bedtime as needed.     naproxen  sodium (ANAPROX  DS) 550 MG tablet Take 1 tablet (550 mg total) by mouth 2 (two) times daily as needed for moderate pain (pain score 4-6). Take with  meals 30 tablet 1   Nebulizers (COMPRESSOR/NEBULIZER) MISC Use as directed for nebulized medications. 1 each 0   nicotine  (NICODERM CQ  - DOSED IN MG/24 HOURS) 21 mg/24hr patch Place 21 mg onto the skin daily.     nitroGLYCERIN  (NITROSTAT ) 0.4 MG SL tablet Place under the tongue.     OXYGEN Inhale 2 L into the lungs at bedtime.     pantoprazole  (PROTONIX ) 40 MG tablet Take 1 tablet (40 mg total) by mouth 2 (two) times daily before meals     promethazine -dextromethorphan  (PROMETHAZINE -DM) 6.25-15 MG/5ML syrup Take 5 mLs by mouth 4 (four) times daily as needed for cough. 180 mL 1   sodium chloride  1 g tablet Take 5 tablets (5 g total) by mouth 3 (three) times daily with meals.     tadalafil  (CIALIS ) 5 MG tablet Take 1 tablet (5 mg total) by mouth daily as needed for erectile dysfunction. 90 tablet 3   traMADol  (ULTRAM ) 50 MG tablet Take 50 mg by mouth  every 6 (six) hours as needed.     YUPELRI  175 MCG/3ML nebulizer solution TAKE 3 MLS (175 MCG TOTAL) BY NEBULIZATION DAILY. DX: J44.9 90 mL 11   No current facility-administered medications for this visit.   Facility-Administered Medications Ordered in Other Visits  Medication Dose Route Frequency Provider Last Rate Last Admin   heparin  lock flush 100 unit/mL  500 Units Intravenous Once [name], Omir J, MD         Abtx:  Anti-infectives (From admission, onward)    None       REVIEW OF SYSTEMS:  Const: No fever or chills. Tired no energy , weak ,  Eyes: negative diplopia or visual changes, negative eye pain ENT: negative coryza, negative sore throat Resp: cough, ,dyspnea Cards: negative for chest pain, palpitations, lower extremity edema GU: negative for frequency, dysuria and hematuria GI: poor appetite , weight loss Skin: negative for rash and pruritus Heme: negative for easy bruising and gum/nose bleeding MS: fatigue Neurolo:negative for headaches, dizziness, vertigo, memory problems  Psych:worried about having a bad ending and  not knowing how it will be   Endocrine: negative for thyroid , diabetes Allergy/Immunology-as above:  VITALS:  Ht 6' (1.829 m)   Wt 168 lb (76.2 kg)   BMI 22.78 kg/m   Weight was 192 in May 2023   PHYSICAL EXAM:  General: Alert, cooperative,weak   Lungs: b/l air entry Rhonchi b/l- crepts rt side Heart: Regular rate and rhythm, no murmur, rub or gallop. Abdomen: did not examine Extremities: atraumatic, no cyanosis. No edema. No clubbing Skin: No rashes or lesions. Or bruising Lymph: Cervical, supraclavicular normal. Neurologic: Grossly non-focal  Labs    Latest Ref Rng & Units 06/11/2024    9:44 AM 11/12/2023    5:57 AM 11/09/2023    5:55 AM  CBC  WBC 4.0 - 10.5 K/uL 8.2  8.7  7.5   Hemoglobin 13.0 - 17.0 g/dL 87.0  86.7  86.6   Hematocrit 39.0 - 52.0 % 38.1  39.1  38.9   Platelets 150 - 400 K/uL 240  180  204        Latest Ref Rng & Units 06/11/2024    9:44 AM 04/16/2024   10:26 AM 01/16/2024    9:27 AM  CMP  Glucose 70 - 99 mg/dL 99   88   BUN 8 - 23 mg/dL 8  7  6    Creatinine 0.61 - 1.24 mg/dL 9.28  9.32  9.33   Sodium 135 - 145 mmol/L 134   137   Potassium 3.5 - 5.1 mmol/L 4.3   3.7   Chloride 98 - 111 mmol/L 95   99   CO2 22 - 32 mmol/L 30   30   Calcium  8.9 - 10.3 mg/dL 8.8   8.7   Total Protein 6.5 - 8.1 g/dL 6.5     Total Bilirubin 0.0 - 1.2 mg/dL 0.6     Alkaline Phos 38 - 126 U/L 79     AST 15 - 41 U/L 22     ALT 0 - 44 U/L 16       s  ? Impression/Recommendation Mycobacterium Avium Complex (MAC) lung infection   Chronic MAC lung infection with a cavity in the right lung has been managed for two years without significant progression. Current symptoms are due to COPD exacerbation. He was on azithromycin , ethambutol ,which was stopped on 9/11 at his request He would like to restart Azithromycin  Azithromycin  by itself not advisable as can develop  resistance- will introduce ethambutol  in a week  Clofazamine stopped by Dr[facility]olivieri at [facility] .      Chronic  Obstructive Pulmonary Disease (COPD) with exacerbation   COPD exacerbation is the primary cause of symptoms, including dyspnea and reduced mobility, with significant sputum production. Smoking history contributes to disease progression. He uses Yupelri , Pulmicort , Brovana , and albuterol  as needed. Pulmonary rehabilitation is recommended to improve lung function. Smoking cessation is crucial. Encourage smoking cessation. . Use albuterol  as needed for acute symptoms.  Consider hypertonic saline nebulizer for sputum clearance. Order sputum culture for bacterial infections.  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 Inhaled amikacin  was not tolerated due to respiratory side effects. Discontinued inhaled amikacin .  Lung Cancer with radiation therapy   Cancer treated with radiation has contributed to lung cavity formation. There are no signs of recurrence. Followed by Dr.finnegan . Oct 28 has a repeat Ct scan  The cavity serves as a MAC reservoir without significant issues.    Follow-up   Ongoing management and monitoring of COPD and MAC are necessary.  Follow up with Dr. Tamea for COPD management. Follow up with Dr.Olivier for MAC lung management .  Ensure attendance at pulmonary rehabilitation sessions.  Hypothyroidism on Synthroid - recently increased   Hyponatremia due to SIADH- . 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

## 2024-08-07 NOTE — Progress Notes (Signed)
 MRN : [id number]  Paul Carpenter is a 68 y.o. (1956/03/06) male who presents with chief complaint of  Chief Complaint  Patient presents with   Follow-up    3 month right dvt ultrasound follow up  .  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  He is doing reasonably well.  He does still have some pain in the right arm but the swelling is essentially gone and the prominent varicosities have nearly resolved in the right upper extremity.  A venous duplex today shows a patent right subclavian vein stent with no thrombosis in the right upper extremity or central venous systems that can be visualized.  Current Outpatient Medications  Medication Sig Dispense Refill   acetaminophen  (TYLENOL ) 500 MG tablet Take 500 mg by mouth daily as needed.     albuterol  (PROVENTIL ) (2.5 MG/3ML) 0.083% nebulizer solution Take 3 mLs (2.5 mg total) by nebulization 4 (four) times daily as needed for wheezing or shortness of breath. 75 mL 11   albuterol  (VENTOLIN  HFA) 108 (90 Base) MCG/ACT inhaler INHALE 2 PUFFS BY MOUTH EVERY 4 HOURS AS NEEDED FOR WHEEZE OR FOR SHORTNESS OF BREATH 18 each 2   arformoterol  (BROVANA ) 15 MCG/2ML NEBU Take 2 mLs (15 mcg total) by nebulization 2 (two) times daily. Dx: J44.9 120 mL 11   aspirin EC 81 MG tablet Take 81 mg by mouth daily. Swallow whole.     atorvastatin  (LIPITOR) 10 MG tablet Take 10 mg by mouth daily.     budesonide  (PULMICORT ) 0.5 MG/2ML nebulizer solution Take 2 mLs (0.5 mg total) by nebulization 2 (two) times daily. 360 mL 3   chlorpheniramine-HYDROcodone  (TUSSIONEX) 10-8 MG/5ML Take 5 mLs by mouth every 12 (twelve) hours as needed for cough. 115 mL 0   clotrimazole  (MYCELEX ) 10 MG troche Take 10 mg by mouth 3 (three) times daily.     diazepam  (VALIUM ) 5 MG tablet Take 5 mg by mouth at bedtime as needed.     ferrous sulfate  325 (65 FE) MG tablet Take 325 mg by mouth at bedtime.      Fluocinolone Acetonide 0.01 % OIL PLEASE SEE ATTACHED FOR DETAILED DIRECTIONS     folic acid  (FOLVITE ) 1 MG tablet Take 1 mg by mouth daily.     guaiFENesin  (MUCINEX ) 600 MG 12 hr tablet Take 1 tablet (600 mg total) by mouth 2 (two) times daily. 60 tablet 0   hydrocortisone  (CORTEF ) 10 MG tablet Take 2 tablets in the morning and 1 tablet mid afternoon (2 to 4 PM) 90 tablet 0   levothyroxine  (SYNTHROID ) 175 MCG tablet Take 175 mcg by mouth daily before breakfast.     lidocaine -prilocaine  (EMLA ) cream APPLY 1 APPLICATION TOPICALLY AS NEEDED. 30 g 1   magnesium  oxide (MAG-OX) 400 MG tablet Take 800 mg by mouth 2 (two) times daily.     melatonin 3 MG TABS tablet Take 3 mg by mouth at bedtime as needed.     naproxen  sodium (ANAPROX  DS) 550 MG tablet Take 1 tablet (550 mg total) by mouth 2 (two) times daily as needed for moderate pain (pain score 4-6). Take with meals 30 tablet 1   Nebulizers (COMPRESSOR/NEBULIZER) MISC Use as directed for nebulized medications. 1 each 0   nicotine  (NICODERM CQ  - DOSED IN MG/24 HOURS) 21 mg/24hr patch Place 21 mg onto the skin daily.     nitroGLYCERIN  (NITROSTAT ) 0.4 MG SL tablet Place under the  tongue.     OXYGEN Inhale 2 L into the lungs at bedtime.     pantoprazole  (PROTONIX ) 40 MG tablet Take 1 tablet (40 mg total) by mouth 2 (two) times daily before meals     promethazine -dextromethorphan  (PROMETHAZINE -DM) 6.25-15 MG/5ML syrup Take 5 mLs by mouth 4 (four) times daily as needed for cough. 180 mL 1   sodium chloride  1 g tablet Take 5 tablets (5 g total) by mouth 3 (three) times daily with meals.     tadalafil  (CIALIS ) 5 MG tablet Take 1 tablet (5 mg total) by mouth daily as needed for erectile dysfunction. 90 tablet 3   traMADol  (ULTRAM ) 50 MG tablet Take 50 mg by mouth every 6 (six) hours as needed.     YUPELRI  175 MCG/3ML nebulizer solution TAKE 3 MLS (175 MCG TOTAL) BY NEBULIZATION DAILY. DX: J44.9 90 mL 11   No current facility-administered medications for this  visit.   Facility-Administered Medications Ordered in Other Visits  Medication Dose Route Frequency Provider Last Rate Last Admin   heparin  lock flush 100 unit/mL  500 Units Intravenous Once Jacobo Evalene PARAS, MD        Past Medical History:  Diagnosis Date   Anginal pain    Anxiety    Asthma    Chest pain    CHF (congestive heart failure) (HCC)    Chicken pox    Complication of anesthesia    o2 dropped after neck fusion   COPD (chronic obstructive pulmonary disease) (HCC)    Coronary artery disease    Cough    chronic  clear phlegm   Dysrhythmia    palpitations   GERD (gastroesophageal reflux disease)    h/o reflux/ hoarsness   Hematochezia    Hemorrhoids    History of chickenpox    History of colon polyps    History of Helicobacter pylori infection    Hoarseness    Hypertension    Lung cancer (HCC) 05/2016   Chemo + rad tx's.    Migraines    OSA (obstructive sleep apnea)    has CPAP but does not use   Personal history of tobacco use, presenting hazards to health 03/05/2016   Pneumonia    5/17   Raynaud disease    Raynaud disease    Raynaud's disease    Rotator cuff tear    on right   Shortness of breath dyspnea    Sleep apnea    Ulcer (traumatic) of oral mucosa     Past Surgical History:  Procedure Laterality Date   BACK SURGERY     cervical fusion x 2   CARDIAC CATHETERIZATION     CERVICAL DISCECTOMY     x 2   COLONOSCOPY     COLONOSCOPY N/A 07/25/2015   Procedure: COLONOSCOPY;  Surgeon: Gladis RAYMOND Mariner, MD;  Location: [facility] ENDOSCOPY;  Service: Endoscopy;  Laterality: N/A;   COLONOSCOPY WITH PROPOFOL  N/A 10/04/2017   Procedure: COLONOSCOPY WITH PROPOFOL ;  Surgeon: Mariner Gladis RAYMOND, MD;  Location: [facility] ENDOSCOPY;  Service: Endoscopy;  Laterality: N/A;   COLONOSCOPY WITH PROPOFOL  N/A 07/10/2020   Procedure: COLONOSCOPY WITH PROPOFOL ;  Surgeon: Toledo, Ladell POUR, MD;  Location: [facility] ENDOSCOPY;  Service: Gastroenterology;  Laterality: N/A;    ELECTROMAGNETIC NAVIGATION BROCHOSCOPY Left 06/28/2016   Procedure: ELECTROMAGNETIC NAVIGATION BRONCHOSCOPY;  Surgeon: Jorie Cha, MD;  Location: [facility] ORS;  Service: Cardiopulmonary;  Laterality: Left;   ENDOBRONCHIAL ULTRASOUND N/A 04/11/2018   Procedure: ENDOBRONCHIAL ULTRASOUND;  Surgeon: Kasa, Kurian, MD;  Location: [facility] ORS;  Service: Cardiopulmonary;  Laterality: N/A;   ESOPHAGOGASTRODUODENOSCOPY N/A 07/25/2015   Procedure: ESOPHAGOGASTRODUODENOSCOPY (EGD);  Surgeon: Gladis RAYMOND Mariner, MD;  Location: [facility] ENDOSCOPY;  Service: Endoscopy;  Laterality: N/A;   ESOPHAGOGASTRODUODENOSCOPY (EGD) WITH PROPOFOL  N/A 07/10/2020   Procedure: ESOPHAGOGASTRODUODENOSCOPY (EGD) WITH PROPOFOL ;  Surgeon: Toledo, Ladell POUR, MD;  Location: [facility] ENDOSCOPY;  Service: Gastroenterology;  Laterality: N/A;   NASAL SINUS SURGERY     x 2    PORTA CATH INSERTION N/A 04/24/2018   Procedure: PORTA CATH INSERTION;  Surgeon: Marea Selinda RAMAN, MD;  Location: [facility] INVASIVE CV LAB;  Service: Cardiovascular;  Laterality: N/A;   PORTA CATH REMOVAL N/A 03/08/2024   Procedure: PORTA CATH REMOVAL;  Surgeon: Marea Selinda RAMAN, MD;  Location: [facility] INVASIVE CV LAB;  Service: Cardiovascular;  Laterality: N/A;   rotator cuff surgery Right    07/2016   SEPTOPLASTY     SKIN GRAFT     UPPER EXTREMITY VENOGRAPHY Right 04/16/2024   Procedure: UPPER EXTREMITY VENOGRAPHY;  Surgeon: Marea Selinda RAMAN, MD;  Location: [facility] INVASIVE CV LAB;  Service: Cardiovascular;  Laterality: Right;     Social History   Tobacco Use   Smoking status: Every Day    Current packs/day: 1.50    Average packs/day: 3.0 packs/day for 52.8 years (156.6 ttl pk-yrs)    Types: Cigarettes    Start date: 1973    Passive exposure: Current   Smokeless tobacco: Never  Vaping Use   Vaping status: Never Used  Substance Use Topics   Alcohol use: Not Currently    Alcohol/week: 2.0 standard drinks of alcohol    Types: 2 Standard drinks or equivalent per week    Comment: moderate    Drug use: No      Family History  Problem Relation Age of Onset   Heart disease Father    Prostate cancer Father    Heart disease Paternal Grandmother    Heart attack Maternal Grandfather 92   Kidney cancer Neg Hx    Bladder Cancer Neg Hx    Other Neg Hx        pituitary abnormality     Allergies  Allergen Reactions   Lisinopril Rash   Varenicline  Rash       REVIEW OF SYSTEMS (Negative unless checked)   Constitutional: [] Weight loss  [] Fever  [] Chills Cardiac: [] Chest pain   [] Chest pressure   [] Palpitations   [] Shortness of breath when laying flat   [] Shortness of breath at rest   [x] Shortness of breath with exertion. Vascular:  [] Pain in legs with walking   [] Pain in legs at rest   [] Pain in legs when laying flat   [] Claudication   [] Pain in feet when walking  [] Pain in feet at rest  [] Pain in feet when laying flat   [] History of DVT   [] Phlebitis   [] Swelling in legs   [x] Varicose veins   [] Non-healing ulcers Pulmonary:   [] Uses home oxygen   [] Productive cough   [] Hemoptysis   [] Wheeze  [] COPD   [] Asthma Neurologic:  [] Dizziness  [] Blackouts   [] Seizures   [] History of stroke   [] History of TIA  [] Aphasia   [] Temporary blindness   [] Dysphagia   [] Weakness or numbness in arms   [x] Weakness or numbness in legs Musculoskeletal:  [] Arthritis   [] Joint swelling   [x] Joint pain   [] Low back pain Hematologic:  [] Easy bruising  [] Easy bleeding   [] Hypercoagulable state   [] Anemic   Gastrointestinal:  [] Blood in stool   []   Vomiting blood  [] Gastroesophageal reflux/heartburn   [] Abdominal pain Genitourinary:  [] Chronic kidney disease   [] Difficult urination  [] Frequent urination  [] Burning with urination   [] Hematuria Skin:  [] Rashes   [] Ulcers   [] Wounds Psychological:  [] History of anxiety   []  History of major depression.  Physical Examination  BP (!) 142/75   Pulse 88   Resp 18   Ht 6' (1.829 m)   Wt 165 lb (74.8 kg)   BMI 22.38 kg/m  Gen:  WD/WN, NAD Head: [place]/AT, No  temporalis wasting. Ear/Nose/Throat: Hearing grossly intact, nares w/o erythema or drainage Eyes: Conjunctiva clear. Sclera non-icteric Neck: Supple.  Trachea midline Pulmonary:  Good air movement, no use of accessory muscles.  Cardiac: RRR, no JVD Vascular:  Vessel Right Left  Radial Palpable Palpable               Musculoskeletal: M/S 5/5 throughout.  No deformity or atrophy.  Superficial veins in the right upper arm may be mildly prominent but much better.  No appreciable upper extremity edema. Neurologic: Sensation grossly intact in extremities.  Symmetrical.  Speech is fluent.  Psychiatric: Judgment intact, Mood & affect appropriate for pt's clinical situation. Dermatologic: No rashes or ulcers noted.  No cellulitis or open wounds.      Labs Recent Results (from the past 2160 hours)  Acid Fast Culture with reflexed sensitivities     Status: Abnormal   Collection Time: 06/11/24  7:30 AM  Result Value Ref Range   Acid Fast Culture Positive (A)     Comment: (NOTE) Acid-fast bacilli have been detected in culture at 1 week. Further identification to follow. Performed At: [facility] [address] [place], [place] [id number] Jennette Shorter MD Ey:[id number]    Source of Sample EXPECTORATED SPUTUM     Comment: Performed at [facility], [address] [place], [place] [address]  Acid Fast Smear (AFB)     Status: Abnormal   Collection Time: 06/11/24  7:30 AM  Result Value Ref Range   AFB Specimen Processing REFERT     Comment: Please refer to the following specimen for additional lab results.   Acid Fast Smear Positive (A)     Comment: (NOTE) 1+, 4-36 acid-fast bacilli per 100 fields at 400X magnification, fluorescent smear Reported to Peggy Sor at 1420 06-12-24 CE Faxed to [phone] Performed At: [facility] [address] [place], [place] [id number] Jennette Shorter MD Ey:[id number]    Source (AFB) SPUTUM     Comment: Performed at  [facility], [address] [place], [place] [address]  Acid Fast ID by PCR and Susceptibilities     Status: Abnormal   Collection Time: 06/11/24  7:30 AM  Result Value Ref Range   M Tuberculosis Complex Negative     Comment: (NOTE) This test was developed and its performance characteristics determined by [facility]. It has not been cleared or approved by the Food and Drug Administration.    M avium complex Positive (A)     Comment: (NOTE) This test was developed and its performance characteristics determined by [facility]. It has not been cleared or approved by the Food and Drug Administration.    Reflex ID by MALDI Not Indicated    Other: NAIDN     Comment: No additional identification testing is necessary.   Susceptibility Testing See reflex.     Comment: (NOTE) Performed At: [facility] [address] [place], [place] [id number] Jennette Shorter MD Ey:[id number]   MAC Susceptibility Broth  Status: Abnormal   Collection Time: 06/11/24  7:30 AM  Result Value Ref Range   Organism ID Comment (A)     Comment: Mycobacterium avium complex   Amikacin  8.0 ug/mL Susceptible     Comment: (NOTE) Breakpoints have been established for only some organism-drug combinations as indicated. This test was developed and its performance characteristics determined by [facility]. It has not been cleared or approved by the Food and Drug Administration. For Mycobacterium avium-intracellulare complex, the interpretive criteria for amikacin  apply to IV treatment. If using amikacin  (liposomal, inhaled), the MIC interpretive breakpoints are <= 64ug/mL Susceptible, >= 128 ug/mL Resistant (CLSI M24S).    Ciprofloxacin  >8.0 ug/mL     Comment: (NOTE) Breakpoints have been established for only some organism-drug combinations as indicated. This test was developed and its performance characteristics determined by [facility]. It has not been cleared or approved by the Food and Drug  Administration.    Clarithromycin >64.0 ug/mL Resistant     Comment: (NOTE) Breakpoints have been established for only some organism-drug combinations as indicated. This test was developed and its performance characteristics determined by [facility]. It has not been cleared or approved by the Food and Drug Administration.    Clofazimine  NOT PERFORMED     Comment: (NOTE) Test not performed Breakpoints have been established for only some organism-drug combinations as indicated. This test was developed and its performance characteristics determined by [facility]. It has not been cleared or approved by the Food and Drug Administration.    Doxycycline  >8.0 ug/mL     Comment: (NOTE) Breakpoints have been established for only some organism-drug combinations as indicated. This test was developed and its performance characteristics determined by [facility]. It has not been cleared or approved by the Food and Drug Administration.    Linezolid 32.0 ug/mL Resistant     Comment: (NOTE) Breakpoints have been established for only some organism-drug combinations as indicated. This test was developed and its performance characteristics determined by [facility]. It has not been cleared or approved by the Food and Drug Administration.    Minocycline >8.0 ug/mL     Comment: (NOTE) Breakpoints have been established for only some organism-drug combinations as indicated. This test was developed and its performance characteristics determined by [facility]. It has not been cleared or approved by the Food and Drug Administration.    Moxifloxacin 4.0 ug/mL Resistant     Comment: (NOTE) Breakpoints have been established for only some organism-drug combinations as indicated. This test was developed and its performance characteristics determined by [facility]. It has not been cleared or approved by the Food and Drug Administration.    Rifabutin 1.0 ug/mL     Comment: (NOTE) Breakpoints have been established for  only some organism-drug combinations as indicated. This test was developed and its performance characteristics determined by [facility]. It has not been cleared or approved by the Food and Drug Administration.    Rifampin  >4.0 ug/mL     Comment: (NOTE) Breakpoints have been established for only some organism-drug combinations as indicated. This test was developed and its performance characteristics determined by [facility]. It has not been cleared or approved by the Food and Drug Administration.    Streptomycin 32.0 ug/mL     Comment: (NOTE) Breakpoints have been established for only some organism-drug combinations as indicated. This test was developed and its performance characteristics determined by [facility]. It has not been cleared or approved by the Food and Drug Administration.    Trimethoprim/Sulfa >4/76 ug/mL     Comment: (NOTE) Breakpoints have  been established for only some organism-drug combinations as indicated. This test was developed and its performance characteristics determined by [facility]. It has not been cleared or approved by the Food and Drug Administration. Performed At: [facility] [address] [place], [place] [id number] Jennette Shorter MD Ey:[id number]   Comprehensive metabolic panel with GFR     Status: Abnormal   Collection Time: 06/11/24  9:44 AM  Result Value Ref Range   Sodium 134 (L) 135 - 145 mmol/L   Potassium 4.3 3.5 - 5.1 mmol/L   Chloride 95 (L) 98 - 111 mmol/L   CO2 30 22 - 32 mmol/L   Glucose, Bld 99 70 - 99 mg/dL    Comment: Glucose reference range applies only to samples taken after fasting for at least 8 hours.   BUN 8 8 - 23 mg/dL   Creatinine, Ser 9.28 0.61 - 1.24 mg/dL   Calcium  8.8 (L) 8.9 - 10.3 mg/dL   Total Protein 6.5 6.5 - 8.1 g/dL   Albumin 3.6 3.5 - 5.0 g/dL   AST 22 15 - 41 U/L   ALT 16 0 - 44 U/L   Alkaline Phosphatase 79 38 - 126 U/L   Total Bilirubin 0.6 0.0 - 1.2 mg/dL   GFR, Estimated >39 >39 mL/min     Comment: (NOTE) Calculated using the CKD-EPI Creatinine Equation (2021)    Anion gap 9 5 - 15    Comment: Performed at [facility], [address] [place], [place] [address]  CBC with Differential/Platelet     Status: Abnormal   Collection Time: 06/11/24  9:44 AM  Result Value Ref Range   WBC 8.2 4.0 - 10.5 K/uL   RBC 4.13 (L) 4.22 - 5.81 MIL/uL   Hemoglobin 12.9 (L) 13.0 - 17.0 g/dL   HCT 61.8 (L) 60.9 - 47.9 %   MCV 92.3 80.0 - 100.0 fL   MCH 31.2 26.0 - 34.0 pg   MCHC 33.9 30.0 - 36.0 g/dL   RDW 85.8 88.4 - 84.4 %   Platelets 240 150 - 400 K/uL   nRBC 0.0 0.0 - 0.2 %   Neutrophils Relative % 84 %   Neutro Abs 7.0 1.7 - 7.7 K/uL   Lymphocytes Relative 7 %   Lymphs Abs 0.6 (L) 0.7 - 4.0 K/uL   Monocytes Relative 7 %   Monocytes Absolute 0.6 0.1 - 1.0 K/uL   Eosinophils Relative 1 %   Eosinophils Absolute 0.1 0.0 - 0.5 K/uL   Basophils Relative 1 %   Basophils Absolute 0.1 0.0 - 0.1 K/uL   Immature Granulocytes 0 %   Abs Immature Granulocytes 0.01 0.00 - 0.07 K/uL    Comment: Performed at [facility], [address] [place], [place] [address]  Acid Fast Culture with reflexed sensitivities     Status: Abnormal   Collection Time: 06/12/24  7:30 AM  Result Value Ref Range   Acid Fast Culture Positive (A)     Comment: (NOTE) Acid-fast bacilli have been detected in culture at 1 week. Further identification to follow. Performed At: [facility] [address] [place], [place] [id number] Jennette Shorter MD Ey:[phone]    Source of Sample EXPECTORATED SPUTUM     Comment: Performed at [facility], [address] [place], [place] [address]  Acid Fast Smear (AFB)     Status: Abnormal   Collection Time: 06/12/24  7:30 AM  Result Value Ref Range   AFB Specimen Processing Concentration    Acid  Fast Smear Positive (A)     Comment: (NOTE) 2+, 4-36 acid-fast bacilli per 10 fields at 400X magnification, fluorescent smear REPORTED RESULT  TO MANDY WILSON ON 06-18-24 AT 1258. FAX CONFIRMATION TO [phone].CEM Performed At: [facility] [address] [place], [place] [id number] Jennette Shorter MD Ey:[id number] CORRECTED ON 08/18 AT 1335: PREVIOUSLY REPORTED AS Negative    Source (AFB) PENDING   Acid Fast ID by PCR and Susceptibilities     Status: Abnormal   Collection Time: 06/12/24  7:30 AM  Result Value Ref Range   M Tuberculosis Complex Negative     Comment: (NOTE) This test was developed and its performance characteristics determined by [facility]. It has not been cleared or approved by the Food and Drug Administration.    M avium complex Positive (A)     Comment: (NOTE) This test was developed and its performance characteristics determined by [facility]. It has not been cleared or approved by the Food and Drug Administration.    Reflex ID by MALDI Not Indicated     Comment: (NOTE) Performed At: [facility] [address] [place], [place] [id number] Jennette Shorter MD Ey:[id number]    Other: NAIDN     Comment: No additional identification testing is necessary.   Susceptibility Testing REFERT     Comment: (NOTE) Please refer to the following specimen for additional lab results. SUSCEPTIBILITY TESTING PERFORMED UNDER ACCESSION #: [id number] CORRECTED ON 09/04 AT 1535: PREVIOUSLY REPORTED AS See reflex.   Acid Fast Culture with reflexed sensitivities     Status: Abnormal   Collection Time: 06/13/24  8:30 AM  Result Value Ref Range   Acid Fast Culture Positive (A)     Comment: (NOTE) Acid-fast bacilli have been detected in culture at 2 weeks. Further identification to follow. Performed At: [facility] [address] [place], [place] [id number] Jennette Shorter MD Ey:[id number]    Source of Sample EXPECTORATED SPUTUM     Comment: Performed at [facility], [address] [place], [place] [address]  Acid Fast Smear (AFB)     Status: None   Collection Time: 06/13/24   8:30 AM  Result Value Ref Range   AFB Specimen Processing Concentration    Acid Fast Smear Negative     Comment: (NOTE) Performed At: [facility] [address] [place], [place] [id number] Jennette Shorter MD Ey:[id number]    Source (AFB) SPUTUM     Comment: Performed at [facility], [address] [place], [place] [address]  Acid Fast ID by PCR and Susceptibilities     Status: Abnormal   Collection Time: 06/13/24  8:30 AM  Result Value Ref Range   M Tuberculosis Complex Negative     Comment: (NOTE) This test was developed and its performance characteristics determined by [facility]. It has not been cleared or approved by the Food and Drug Administration.    M avium complex Positive (A)     Comment: (NOTE) This test was developed and its performance characteristics determined by [facility]. It has not been cleared or approved by the Food and Drug Administration.    Reflex ID by MALDI Not Indicated     Comment: (NOTE) Performed At: [facility] [address] [place], [place] [id number] Jennette Shorter MD Ey:[id number]    Other: NAIDN     Comment: No additional identification testing is necessary.   Susceptibility Testing REFERT     Comment: (NOTE) Please refer to the following specimen for additional lab results. SUSCEPTIBILITY TESTING PERFORMED UNDER  ACCESSION #: [id number] REFER to BRO-1 CORRECTED ON 09/04 AT 1535: PREVIOUSLY REPORTED AS See reflex.     Radiology No results found.  Assessment/Plan  SVC syndrome A venous duplex today shows a patent right subclavian vein stent with no thrombosis in the right upper extremity or central venous systems that can be visualized.  This is encouraging.  At this point, I will see him back in 6 months or sooner if problems develop in the interim.  Mixed hyperlipidemia lipid control important in reducing the progression of atherosclerotic disease. Continue statin therapy     Non-small cell lung  cancer, right (HCC) Follows with oncology.  This was the reason the port was placed initially.   Benign essential hypertension blood pressure control important in reducing the progression of atherosclerotic disease. On appropriate oral medications.  Selinda Gu, MD  08/07/2024 3:50 PM    This note was created with Dragon medical transcription system.  Any errors from dictation are purely unintentional

## 2024-08-07 NOTE — Patient Instructions (Addendum)
 Pt here for follow up- has been off MAC treatment since 07/13/24 Would like to start Azithromycin  alone and see-

## 2024-08-07 NOTE — Assessment & Plan Note (Signed)
 A venous duplex today shows a patent right subclavian vein stent with no thrombosis in the right upper extremity or central venous systems that can be visualized.  This is encouraging.  At this point, I will see him back in 6 months or sooner if problems develop in the interim.

## 2024-08-09 ENCOUNTER — Encounter: Admitting: Emergency Medicine

## 2024-08-09 DIAGNOSIS — J449 Chronic obstructive pulmonary disease, unspecified: Secondary | ICD-10-CM | POA: Diagnosis not present

## 2024-08-09 NOTE — Progress Notes (Signed)
 Daily Session Note  Patient Details  Name: SEYMOUR PAVLAK MRN: [id number] Date of Birth: 05-17-1956 Referring Provider:   [facility] from 05/30/2024 in [facility] Cardiac and Pulmonary Rehab  Referring Provider Dr. Dedra Sanders    Encounter Date: 08/09/2024  Check In:  Session Check In - 08/09/24 1539       Check-In   Supervising physician immediately available to respond to emergencies See telemetry face sheet for immediately available ER MD    Location [facility]-Cardiac & Pulmonary Rehab    Staff Present Leita Franks RN,BSN;Joseph [facility] BS, Exercise Physiologist;Noah Tickle, BS, Exercise Physiologist    Virtual Visit No    Medication changes reported     No    Fall or balance concerns reported    No    Tobacco Cessation No Change    Warm-up and Cool-down Performed on first and last piece of equipment    Resistance Training Performed Yes    VAD Patient? No    PAD/SET Patient? No      Pain Assessment   Currently in Pain? No/denies             Social History   Tobacco Use  Smoking Status Every Day   Current packs/day: 1.50   Average packs/day: 3.0 packs/day for 52.8 years (156.7 ttl pk-yrs)   Types: Cigarettes   Start date: 1973   Passive exposure: Current  Smokeless Tobacco Never    Goals Met:  Proper associated with RPD/PD & O2 Sat Independence with exercise equipment Using PLB without cueing & demonstrates good technique Exercise tolerated well No report of concerns or symptoms today Strength training completed today  Goals Unmet:  Not Applicable  Comments: Pt able to follow exercise prescription today without complaint.  Will continue to monitor for progression.    Dr. Oneil Pinal is Medical Director for [facility] Cardiac Rehabilitation.  Dr. Fuad Aleskerov is Medical Director for [facility] Pulmonary Rehabilitation.

## 2024-08-10 ENCOUNTER — Other Ambulatory Visit: Payer: Self-pay | Admitting: Pulmonary Disease

## 2024-08-12 ENCOUNTER — Other Ambulatory Visit: Payer: Self-pay | Admitting: Pulmonary Disease

## 2024-08-13 ENCOUNTER — Encounter

## 2024-08-16 ENCOUNTER — Encounter: Admitting: *Deleted

## 2024-08-16 DIAGNOSIS — J449 Chronic obstructive pulmonary disease, unspecified: Secondary | ICD-10-CM

## 2024-08-16 NOTE — Progress Notes (Signed)
 Daily Session Note  Patient Details  Name: Paul Carpenter MRN: [id number] Date of Birth: 1955-11-27 Referring Provider:   [facility] from 05/30/2024 in [facility] Cardiac and Pulmonary Rehab  Referring Provider Dr. Dedra Sanders    Encounter Date: 08/16/2024  Check In:  Session Check In - 08/16/24 1533       Check-In   Supervising physician immediately available to respond to emergencies See telemetry face sheet for immediately available ER MD    Location [facility]-Cardiac & Pulmonary Rehab    Staff Present Hoy Rodney RN,BSN;Joseph Rolinda RCP,RRT,BSRT;Noah Tickle, [place], Exercise Physiologist;Maxon Conetta BS, Exercise Physiologist    Virtual Visit No    Medication changes reported     No    Fall or balance concerns reported    No    Warm-up and Cool-down Performed on first and last piece of equipment    Resistance Training Performed Yes    VAD Patient? No    PAD/SET Patient? No      Pain Assessment   Currently in Pain? No/denies             Social History   Tobacco Use  Smoking Status Every Day   Current packs/day: 1.50   Average packs/day: 3.0 packs/day for 52.8 years (156.7 ttl pk-yrs)   Types: Cigarettes   Start date: 1973   Passive exposure: Current  Smokeless Tobacco Never    Goals Met:  Independence with exercise equipment Exercise tolerated well No report of concerns or symptoms today Strength training completed today  Goals Unmet:  Not Applicable  Comments: Pt able to follow exercise prescription today without complaint.  Will continue to monitor for progression.    Dr. Oneil Pinal is Medical Director for [facility] Cardiac Rehabilitation.  Dr. Fuad Aleskerov is Medical Director for [facility] Pulmonary Rehabilitation.

## 2024-08-20 ENCOUNTER — Telehealth: Payer: Self-pay

## 2024-08-20 ENCOUNTER — Encounter: Admitting: Emergency Medicine

## 2024-08-20 DIAGNOSIS — J449 Chronic obstructive pulmonary disease, unspecified: Secondary | ICD-10-CM

## 2024-08-20 NOTE — Telephone Encounter (Signed)
 Copied from CRM #[id number]. Topic: Clinical - Medication Question >> Aug 20, 2024 11:49 AM Lavanda D wrote: Reason for CRM: Patient's wife is calling/requesting a nurse to call back and left the following message: when the patient was in last she order was placed through [facility] for the overnight Oxygen study, he has not yet heard anything from them. She was also wondering if any medication would be prescribed for the wheezing he was experiencing at his last appt like prednisone  etc.

## 2024-08-20 NOTE — Progress Notes (Signed)
 Daily Session Note  Patient Details  Name: KAELON WEEKES MRN: [id number] Date of Birth: Aug 14, 1956 Referring Provider:   [facility] from 05/30/2024 in [facility] Cardiac and Pulmonary Rehab  Referring Provider Dr. Dedra Sanders    Encounter Date: 08/20/2024  Check In:  Session Check In - 08/20/24 1400       Check-In   Supervising physician immediately available to respond to emergencies See telemetry face sheet for immediately available ER MD    Location [facility]-Cardiac & Pulmonary Rehab    Staff Present Devaughn Jaeger, BS, Exercise Physiologist;Margaret Best, MS, Exercise Physiologist;Payeton Germani RN,BSN;Maxon Conetta BS, Exercise Physiologist    Virtual Visit No    Medication changes reported     No    Fall or balance concerns reported    No    Tobacco Cessation No Change    Warm-up and Cool-down Performed on first and last piece of equipment    Resistance Training Performed Yes    VAD Patient? No    PAD/SET Patient? No      Pain Assessment   Currently in Pain? No/denies             Social History   Tobacco Use  Smoking Status Every Day   Current packs/day: 1.50   Average packs/day: 3.0 packs/day for 52.8 years (156.7 ttl pk-yrs)   Types: Cigarettes   Start date: 1973   Passive exposure: Current  Smokeless Tobacco Never    Goals Met:  Proper associated with RPD/PD & O2 Sat Independence with exercise equipment Using PLB without cueing & demonstrates good technique Exercise tolerated well No report of concerns or symptoms today Strength training completed today  Goals Unmet:  Not Applicable  Comments: Pt able to follow exercise prescription today without complaint.  Will continue to monitor for progression.    Dr. Oneil Pinal is Medical Director for [facility] Cardiac Rehabilitation.  Dr. Fuad Aleskerov is Medical Director for [facility] Pulmonary Rehabilitation.

## 2024-08-20 NOTE — Telephone Encounter (Signed)
 Patient advised as below. Patient requested phone number to [facility]. Per Donzell, order was sent to [facility]. NFN.

## 2024-08-20 NOTE — Telephone Encounter (Signed)
 For the wheezing he can use his albuterol  rescue nebulizer medication.  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  We can resend a overnight oximetry determination request, on his current oxygen supplementation, to [facility].

## 2024-08-21 NOTE — Telephone Encounter (Signed)
 Copied from CRM #[id number]. Topic: Clinical - Request for Lab/Test Order >> Aug 21, 2024 11:29 AM Celestine FALCON wrote: Reason for CRM: Pt was to have an overnight pulse oximetry test completed via [facility], but the pt stated when he called he was he was inactive. Pt stated he used to have oxygen and oxygen tanks from [facility] that was picked up a long time ago, and [facility] isn't showing he has anything through them since 2016. Pt stated  has a POC through [facility] and a CPAP.  Pt stated he needs assistance with Dr. Tamea sending over an order to [facility] to assist with the overnight oximetry test.  Pt's phone number is [phone] ok to leave a vm.

## 2024-08-22 ENCOUNTER — Encounter: Payer: Self-pay | Admitting: *Deleted

## 2024-08-22 DIAGNOSIS — J449 Chronic obstructive pulmonary disease, unspecified: Secondary | ICD-10-CM

## 2024-08-22 NOTE — Progress Notes (Signed)
 Pulmonary Individual Treatment Plan  Patient Details  Name: Paul Carpenter MRN: [id number] Date of Birth: November 28, 1955 Referring Provider:   Conrad Ports Pulmonary Rehab from 05/30/2024 in [facility] Cardiac and Pulmonary Rehab  Referring Provider Dr. Dedra Sanders    Initial Encounter Date:  Flowsheet Row Pulmonary Rehab from 05/30/2024 in [facility] Cardiac and Pulmonary Rehab  Date 05/30/24    Visit Diagnosis: Chronic obstructive pulmonary disease, unspecified COPD type (HCC)  Patient's Home Medications on Admission:  Current Outpatient Medications:    acetaminophen  (TYLENOL ) 500 MG tablet, Take 500 mg by mouth daily as needed., Disp: , Rfl:    albuterol  (PROVENTIL ) (2.5 MG/3ML) 0.083% nebulizer solution, Take 3 mLs (2.5 mg total) by nebulization 4 (four) times daily as needed for wheezing or shortness of breath., Disp: 75 mL, Rfl: 11   albuterol  (VENTOLIN  HFA) 108 (90 Base) MCG/ACT inhaler, INHALE 2 PUFFS BY MOUTH EVERY 4 HOURS AS NEEDED FOR WHEEZE OR FOR SHORTNESS OF BREATH, Disp: 18 each, Rfl: 1   arformoterol  (BROVANA ) 15 MCG/2ML NEBU, Take 2 mLs (15 mcg total) by nebulization 2 (two) times daily., Disp: 120 mL, Rfl: 11   aspirin EC 81 MG tablet, Take 81 mg by mouth daily. Swallow whole., Disp: , Rfl:    atorvastatin  (LIPITOR) 10 MG tablet, Take 10 mg by mouth daily., Disp: , Rfl:    budesonide  (PULMICORT ) 0.5 MG/2ML nebulizer solution, Take 2 mLs (0.5 mg total) by nebulization 2 (two) times daily., Disp: 360 mL, Rfl: 3   chlorpheniramine-HYDROcodone  (TUSSIONEX) 10-8 MG/5ML, Take 5 mLs by mouth every 12 (twelve) hours as needed for cough., Disp: 115 mL, Rfl: 0   clotrimazole  (MYCELEX ) 10 MG troche, Take 10 mg by mouth 3 (three) times daily., Disp: , Rfl:    diazepam  (VALIUM ) 5 MG tablet, Take 5 mg by mouth at bedtime as needed., Disp: , Rfl:    ferrous sulfate  325 (65 FE) MG tablet, Take 325 mg by mouth at bedtime., Disp: , Rfl:    Fluocinolone Acetonide 0.01 % OIL, PLEASE SEE ATTACHED FOR  DETAILED DIRECTIONS, Disp: , Rfl:    folic acid  (FOLVITE ) 1 MG tablet, Take 1 mg by mouth daily., Disp: , Rfl:    guaiFENesin  (MUCINEX ) 600 MG 12 hr tablet, Take 1 tablet (600 mg total) by mouth 2 (two) times daily., Disp: 60 tablet, Rfl: 0   hydrocortisone  (CORTEF ) 10 MG tablet, Take 2 tablets in the morning and 1 tablet mid afternoon (2 to 4 PM), Disp: 90 tablet, Rfl: 0   levothyroxine  (SYNTHROID ) 175 MCG tablet, Take 175 mcg by mouth daily before breakfast., Disp: , Rfl:    lidocaine -prilocaine  (EMLA ) cream, APPLY 1 APPLICATION TOPICALLY AS NEEDED., Disp: 30 g, Rfl: 1   magnesium  oxide (MAG-OX) 400 MG tablet, Take 800 mg by mouth 2 (two) times daily., Disp: , Rfl:    melatonin 3 MG TABS tablet, Take 3 mg by mouth at bedtime as needed., Disp: , Rfl:    naproxen  sodium (ANAPROX  DS) 550 MG tablet, Take 1 tablet (550 mg total) by mouth 2 (two) times daily as needed for moderate pain (pain score 4-6). Take with meals, Disp: 30 tablet, Rfl: 1   Nebulizers (COMPRESSOR/NEBULIZER) MISC, Use as directed for nebulized medications., Disp: 1 each, Rfl: 0   nicotine  (NICODERM CQ  - DOSED IN MG/24 HOURS) 21 mg/24hr patch, Place 21 mg onto the skin daily., Disp: , Rfl:    nitroGLYCERIN  (NITROSTAT ) 0.4 MG SL tablet, Place under the tongue., Disp: , Rfl:  OXYGEN, Inhale 2 L into the lungs at bedtime., Disp: , Rfl:    pantoprazole  (PROTONIX ) 40 MG tablet, Take 1 tablet (40 mg total) by mouth 2 (two) times daily before meals, Disp: , Rfl:    promethazine -dextromethorphan  (PROMETHAZINE -DM) 6.25-15 MG/5ML syrup, Take 5 mLs by mouth 4 (four) times daily as needed for cough., Disp: 180 mL, Rfl: 1   sodium chloride  1 g tablet, Take 5 tablets (5 g total) by mouth 3 (three) times daily with meals., Disp: , Rfl:    tadalafil  (CIALIS ) 5 MG tablet, Take 1 tablet (5 mg total) by mouth daily as needed for erectile dysfunction., Disp: 90 tablet, Rfl: 3   traMADol  (ULTRAM ) 50 MG tablet, Take 50 mg by mouth every 6 (six) hours as  needed., Disp: , Rfl:    YUPELRI  175 MCG/3ML nebulizer solution, TAKE 3 MLS (175 MCG TOTAL) BY NEBULIZATION DAILY. DX: J44.9, Disp: 90 mL, Rfl: 11 No current facility-administered medications for this visit.  Facility-Administered Medications Ordered in Other Visits:    heparin  lock flush 100 unit/mL, 500 Units, Intravenous, Once, [name], Evalene PARAS, MD  Past Medical History: Past Medical History:  Diagnosis Date   Anginal pain    Anxiety    Asthma    Chest pain    CHF (congestive heart failure) (HCC)    Chicken pox    Complication of anesthesia    o2 dropped after neck fusion   COPD (chronic obstructive pulmonary disease) (HCC)    Coronary artery disease    Cough    chronic  clear phlegm   Dysrhythmia    palpitations   GERD (gastroesophageal reflux disease)    h/o reflux/ hoarsness   Hematochezia    Hemorrhoids    History of chickenpox    History of colon polyps    History of Helicobacter pylori infection    Hoarseness    Hypertension    Lung cancer (HCC) 05/2016   Chemo + rad tx's.    Migraines    OSA (obstructive sleep apnea)    has CPAP but does not use   Personal history of tobacco use, presenting hazards to health 03/05/2016   Pneumonia    5/17   Raynaud disease    Raynaud disease    Raynaud's disease    Rotator cuff tear    on right   Shortness of breath dyspnea    Sleep apnea    Ulcer (traumatic) of oral mucosa     Tobacco Use: Social History   Tobacco Use  Smoking Status Every Day   Current packs/day: 1.50   Average packs/day: 3.0 packs/day for 52.8 years (156.7 ttl pk-yrs)   Types: Cigarettes   Start date: 1973   Passive exposure: Current  Smokeless Tobacco Never    Labs: Review Flowsheet        No data to display           Pulmonary Assessment Scores:  Pulmonary Assessment Scores     Row Name 05/30/24 1525 06/11/24 1717       ADL UCSD   ADL Phase -- Entry    SOB Score total -- 89    Rest -- 3    Walk -- 3    Stairs -- 5     Bath -- 4    Dress -- 2    Shop -- 5      CAT Score   CAT Score 34 --      mMRC Score   mMRC Score 3 --  UCSD: Self-administered rating of dyspnea associated with activities of daily living (ADLs) 6-point scale (0 = not at all to 5 = maximal or unable to do because of breathlessness)  Scoring Scores range from 0 to 120.  Minimally important difference is 5 units  CAT: CAT can identify the health impairment of COPD patients and is better correlated with disease progression.  CAT has a scoring range of zero to 40. The CAT score is classified into four groups of low (less than 10), medium (10 - 20), high (21-30) and very high (31-40) based on the impact level of disease on health status. A CAT score over 10 suggests significant symptoms.  A worsening CAT score could be explained by an exacerbation, poor medication adherence, poor inhaler technique, or progression of COPD or comorbid conditions.  CAT MCID is 2 points  mMRC: mMRC (Modified Medical Research Council) Dyspnea Scale is used to assess the degree of baseline functional disability in patients of respiratory disease due to dyspnea. No minimal important difference is established. A decrease in score of 1 point or greater is considered a positive change.   Pulmonary Function Assessment:   Exercise Target Goals: Exercise Program Goal: Individual exercise prescription set using results from initial 6 min walk test and THRR while considering  patient's activity barriers and safety.   Exercise Prescription Goal: Initial exercise prescription builds to 30-45 minutes a day of aerobic activity, 2-3 days per week.  Home exercise guidelines will be given to patient during program as part of exercise prescription that the participant will acknowledge.  Education: Aerobic Exercise: - Group verbal and visual presentation on the components of exercise prescription. Introduces F.I.T.T principle from ACSM for exercise  prescriptions.  Reviews F.I.T.T. principles of aerobic exercise including progression. Written material provided at class time.   Education: Resistance Exercise: - Group verbal and visual presentation on the components of exercise prescription. Introduces F.I.T.T principle from ACSM for exercise prescriptions  Reviews F.I.T.T. principles of resistance exercise including progression. Written material provided at class time.    Education: Exercise & Equipment Safety: - Individual verbal instruction and demonstration of equipment use and safety with use of the equipment. Flowsheet Row Pulmonary Rehab from 05/30/2024 in [facility] Cardiac [facility] Pulmonary Rehab  Date 05/30/24  Educator [facility]  Instruction Review Code 1- Verbalizes Understanding    Education: Exercise Physiology & General Exercise Guidelines: - Group verbal and written instruction with models to review the exercise physiology of the cardiovascular system and associated critical values. Provides general exercise guidelines with specific guidelines to those with heart or lung disease.    Education: Flexibility, Balance, Mind/Body Relaxation: - Group verbal and visual presentation with interactive activity on the components of exercise prescription. Introduces F.I.T.T principle from ACSM for exercise prescriptions. Reviews F.I.T.T. principles of flexibility and balance exercise training including progression. Also discusses the mind body connection.  Reviews various relaxation techniques to help reduce and manage stress (i.e. Deep breathing, progressive muscle relaxation, and visualization). Balance handout provided to take home. Written material provided at class time.   Activity Barriers & Risk Stratification:  Activity Barriers & Cardiac Risk Stratification - 05/30/24 1519       Activity Barriers & Cardiac Risk Stratification   Activity Barriers Deconditioning;Muscular Weakness;Shortness of Breath;Other (comment)    Comments Shoulder  mobility concerns          6 Minute Walk:  6 Minute Walk     Row Name 05/30/24 1516         6  Minute Walk   Phase Initial     Distance 670 feet     Walk Time 6 minutes     # of Rest Breaks 0     MPH 1.27     METS 2.43     RPE 13     Perceived Dyspnea  3     VO2 Peak 8.52     Symptoms Yes (comment)     Comments dizziness, SOB     Resting HR 88 bpm     Resting BP 114/62     Resting Oxygen Saturation  93 %     Exercise Oxygen Saturation  during 6 min walk 89 %     Max Ex. HR 99 bpm     Max Ex. BP 134/64     2 Minute Post BP 126/68       Interval HR   1 Minute HR 99     2 Minute HR 94     3 Minute HR 91     4 Minute HR 95     5 Minute HR 97     6 Minute HR 99     2 Minute Post HR 87     Interval Heart Rate? Yes       Interval Oxygen   Interval Oxygen? Yes     Baseline Oxygen Saturation % 93 %     1 Minute Oxygen Saturation % 90 %     1 Minute Liters of Oxygen 0 L     2 Minute Oxygen Saturation % 93 %     2 Minute Liters of Oxygen 0 L     3 Minute Oxygen Saturation % 88 %     3 Minute Liters of Oxygen 0 L     4 Minute Oxygen Saturation % 89 %     4 Minute Liters of Oxygen 0 L     5 Minute Oxygen Saturation % 90 %     5 Minute Liters of Oxygen 0 L     6 Minute Oxygen Saturation % 93 %     6 Minute Liters of Oxygen 0 L     2 Minute Post Oxygen Saturation % 95 %     2 Minute Post Liters of Oxygen 0 L       Oxygen Initial Assessment:  Oxygen Initial Assessment - 05/30/24 1524       Home Oxygen   Sleep Oxygen Prescription Continuous    Liters per minute 3    Home Exercise Oxygen Prescription Continuous    Liters per minute 3    Home Resting Oxygen Prescription None    Compliance with Home Oxygen Use Yes      Initial 6 min Walk   Oxygen Used None      Program Oxygen Prescription   Program Oxygen Prescription None      Intervention   Short Term Goals To learn and demonstrate proper pursed lip breathing techniques or other breathing techniques.      Long  Term Goals Exhibits proper breathing techniques, such as pursed lip breathing or other method taught during program session          Oxygen Re-Evaluation:  Oxygen Re-Evaluation     Row Name 06/11/24 1413 07/19/24 1552           Program Oxygen Prescription   Program Oxygen Prescription -- None        Home Oxygen   Home Oxygen Device -- Home Concentrator  Sleep Oxygen Prescription -- Continuous      Liters per minute -- 2      Home Exercise Oxygen Prescription -- None      Liters per minute -- --  prn      Home Resting Oxygen Prescription -- None      Compliance with Home Oxygen Use -- Yes        Goals/Expected Outcomes   Short Term Goals -- To learn and demonstrate proper pursed lip breathing techniques or other breathing techniques.       Long  Term Goals -- Exhibits proper breathing techniques, such as pursed lip breathing or other method taught during program session      Comments Reviewed PLB technique with pt.  Talked about how it works and it's importance in maintaining their exercise saturations. Informed patient how to perform the Pursed Lipped breathing technique. Told patient to Inhale through the nose and out the mouth with pursed lips to keep their airways open, help oxygenate them better, practice when at rest or doing strenuous activity. Patient Verbalizes understanding of technique and will work on and be reiterated during [facility].      Goals/Expected Outcomes Short: Become more profiecient at using PLB.   Long: Become independent at using PLB. Short: use PLB with exertion. Long: use PLB on exertion proficiently and independently.         Oxygen Discharge (Final Oxygen Re-Evaluation):  Oxygen Re-Evaluation - 07/19/24 1552       Program Oxygen Prescription   Program Oxygen Prescription None      Home Oxygen   Home Oxygen Device Home Concentrator    Sleep Oxygen Prescription Continuous    Liters per minute 2    Home Exercise Oxygen Prescription  None    Liters per minute --   prn   Home Resting Oxygen Prescription None    Compliance with Home Oxygen Use Yes      Goals/Expected Outcomes   Short Term Goals To learn and demonstrate proper pursed lip breathing techniques or other breathing techniques.     Long  Term Goals Exhibits proper breathing techniques, such as pursed lip breathing or other method taught during program session    Comments Informed patient how to perform the Pursed Lipped breathing technique. Told patient to Inhale through the nose and out the mouth with pursed lips to keep their airways open, help oxygenate them better, practice when at rest or doing strenuous activity. Patient Verbalizes understanding of technique and will work on and be reiterated during [facility].    Goals/Expected Outcomes Short: use PLB with exertion. Long: use PLB on exertion proficiently and independently.          Initial Exercise Prescription:  Initial Exercise Prescription - 05/30/24 1500       Date of Initial Exercise RX and Referring Provider   Date 05/30/24    Referring Provider Dr. Dedra Sanders      Oxygen   Oxygen Continuous    Liters 0-2L    Maintain Oxygen Saturation 88% or higher      Recumbant Bike   Level 1    RPM 50    Watts 25    Minutes 15    METs 2.43      NuStep   Level 1    SPM 80    Minutes 15    METs 2.43      REL-XR   Level 1    Watts 25    Speed 50  Minutes 15    METs 2.43      Track   Laps 18    Minutes 15    METs 1.98      Intensity   THRR 40-80% of Max Heartrate 114-140    Ratings of Perceived Exertion 11-13    Perceived Dyspnea 0-4      Resistance Training   Training Prescription Yes    Weight 3lb    Reps 10-15          Perform Capillary Blood Glucose checks as needed.  Exercise Prescription Changes:   Exercise Prescription Changes     Row Name 05/30/24 1500 06/20/24 1100 07/04/24 1500 07/19/24 0800 08/02/24 1600     Response to Exercise   Blood Pressure  (Admit) 114/62 134/70 122/60 128/76 110/62   Blood Pressure (Exercise) 134/64 154/82 142/82 122/62 142/60   Blood Pressure (Exit) 126/68 116/64 136/78 126/62 128/72   Heart Rate (Admit) 88 bpm 92 bpm 92 bpm 103 bpm 100 bpm   Heart Rate (Exercise) 99 bpm 101 bpm 113 bpm 90 bpm 106 bpm   Heart Rate (Exit) 81 bpm 88 bpm 98 bpm 85 bpm 93 bpm   Oxygen Saturation (Admit) 93 % 94 % 93 % 96 % 96 %   Oxygen Saturation (Exercise) 89 % 94 % 89 % 93 % 93 %   Oxygen Saturation (Exit) 95 % 96 % 94 % 96 % 95 %   Rating of Perceived Exertion (Exercise) 13 13 15 11 13    Perceived Dyspnea (Exercise) 3 3 3 3 3    Symptoms Dizziness, SOB none none none none   Comments results first 2 weeks of exercise -- -- --   Duration -- Progress to 30 minutes of  aerobic without signs/symptoms of physical distress Progress to 30 minutes of  aerobic without signs/symptoms of physical distress Progress to 30 minutes of  aerobic without signs/symptoms of physical distress Progress to 30 minutes of  aerobic without signs/symptoms of physical distress   Intensity -- THRR unchanged THRR unchanged THRR unchanged THRR unchanged     Progression   Progression -- Continue to progress workloads to maintain intensity without signs/symptoms of physical distress. Continue to progress workloads to maintain intensity without signs/symptoms of physical distress. Continue to progress workloads to maintain intensity without signs/symptoms of physical distress. Continue to progress workloads to maintain intensity without signs/symptoms of physical distress.   Average METs -- 1.37 1.72 1.2 1.21     Resistance Training   Training Prescription -- Yes Yes Yes Yes   Weight -- 3lb 3lb 3lb 3lb   Reps -- 10-15 10-15 10-15 10-15     Interval Training   Interval Training -- No No No No     Oxygen   Oxygen -- Continuous -- -- --   Liters -- 0 -- -- --     Treadmill   MPH -- -- -- -- 0.7   Grade -- -- -- -- 0   Minutes -- -- -- -- 15   METs  -- -- -- -- 1.54     Recumbant Bike   Level -- -- 1 -- --   Watts -- -- 25 -- --   Minutes -- -- 15 -- --   METs -- -- 3.09 -- --     NuStep   Level -- 2 3 1 2    Minutes -- 15 15 30 30    METs -- 1.5 1.1 1.2 1.3     REL-XR   Level -- --  1 -- 1   Minutes -- -- 15 -- 15   METs -- -- -- -- 1     Biostep-RELP   Level -- 1 -- -- --   Minutes -- 15 -- -- --   METs -- 1 -- -- --     Track   Laps -- 9 -- -- --   Minutes -- 15 -- -- --   METs -- 1.49 -- -- --     Oxygen   Maintain Oxygen Saturation -- 88% or higher 88% or higher 88% or higher 88% or higher    Row Name 08/15/24 1100             Response to Exercise   Blood Pressure (Admit) 162/84       Blood Pressure (Exit) 132/72       Heart Rate (Admit) 94 bpm       Heart Rate (Exercise) 100 bpm       Heart Rate (Exit) 88 bpm       Oxygen Saturation (Admit) 95 %       Oxygen Saturation (Exercise) 94 %       Oxygen Saturation (Exit) 98 %       Rating of Perceived Exertion (Exercise) 13       Perceived Dyspnea (Exercise) 3       Symptoms none       Duration Progress to 30 minutes of  aerobic without signs/symptoms of physical distress       Intensity THRR unchanged         Progression   Progression Continue to progress workloads to maintain intensity without signs/symptoms of physical distress.       Average METs 1.45         Resistance Training   Training Prescription Yes       Weight 3 lb       Reps 10-15         Interval Training   Interval Training No         NuStep   Level 2       Minutes 30       METs 1.7         Oxygen   Maintain Oxygen Saturation 88% or higher          Exercise Comments:   Exercise Comments     Row Name 06/11/24 1404           Exercise Comments First full day of exercise!  Patient was oriented to gym and equipment including functions, settings, policies, and procedures.  Patient's individual exercise prescription and treatment plan were reviewed.  All starting workloads  were established based on the results of the 6 minute walk test done at initial orientation visit.  The plan for exercise progression was also introduced and progression will be customized based on patient's performance and goals.          Exercise Goals and Review:   Exercise Goals     Row Name 05/30/24 1523             Exercise Goals   Increase Physical Activity Yes       Intervention Provide advice, education, support and counseling about physical activity/exercise needs.;Develop an individualized exercise prescription for aerobic and resistive training based on initial evaluation findings, risk stratification, comorbidities and participant's personal goals.       Expected Outcomes Short Term: Attend rehab on a regular basis to increase amount of physical activity.;Long Term:  Exercising regularly at least 3-5 days a week.;Long Term: Add in home exercise to make exercise part of routine and to increase amount of physical activity.       Increase Strength and Stamina Yes       Intervention Provide advice, education, support and counseling about physical activity/exercise needs.;Develop an individualized exercise prescription for aerobic and resistive training based on initial evaluation findings, risk stratification, comorbidities and participant's personal goals.       Expected Outcomes Short Term: Increase workloads from initial exercise prescription for resistance, speed, and METs.;Short Term: Perform resistance training exercises routinely during rehab and add in resistance training at home;Long Term: Improve cardiorespiratory fitness, muscular endurance and strength as measured by increased METs and functional capacity ( )       Able to understand and use rate of perceived exertion (RPE) scale Yes       Intervention Provide education and explanation on how to use RPE scale       Expected Outcomes Short Term: Able to use RPE daily in rehab to express subjective intensity level;Long  Term:  Able to use RPE to guide intensity level when exercising independently       Able to understand and use Dyspnea scale Yes       Intervention Provide education and explanation on how to use Dyspnea scale       Expected Outcomes Short Term: Able to use Dyspnea scale daily in rehab to express subjective sense of shortness of breath during exertion;Long Term: Able to use Dyspnea scale to guide intensity level when exercising independently       Knowledge and understanding of Target Heart Rate Range (THRR) Yes       Intervention Provide education and explanation of THRR including how the numbers were predicted and where they are located for reference       Expected Outcomes Short Term: Able to state/look up THRR;Short Term: Able to use daily as guideline for intensity in rehab;Long Term: Able to use THRR to govern intensity when exercising independently       Able to check pulse independently Yes       Intervention Provide education and demonstration on how to check pulse in carotid and radial arteries.;Review the importance of being able to check your own pulse for safety during independent exercise       Expected Outcomes Long Term: Able to check pulse independently and accurately;Short Term: Able to explain why pulse checking is important during independent exercise       Understanding of Exercise Prescription Yes       Intervention Provide education, explanation, and written materials on patient's individual exercise prescription       Expected Outcomes Short Term: Able to explain program exercise prescription;Long Term: Able to explain home exercise prescription to exercise independently          Exercise Goals Re-Evaluation :  Exercise Goals Re-Evaluation     Row Name 06/11/24 1405 06/11/24 1407 06/20/24 1136 07/04/24 1519 07/19/24 0825     Exercise Goal Re-Evaluation   Exercise Goals Review Increase Physical Activity;Able to understand and use rate of perceived exertion (RPE)  scale;Knowledge and understanding of Target Heart Rate Range (THRR);Understanding of Exercise Prescription;Increase Strength and Stamina;Able to understand and use Dyspnea scale;Able to check pulse independently Increase Physical Activity;Able to understand and use rate of perceived exertion (RPE) scale;Knowledge and understanding of Target Heart Rate Range (THRR);Understanding of Exercise Prescription;Increase Strength and Stamina;Able to understand and use Dyspnea scale;Able to check  pulse independently Increase Physical Activity;Increase Strength and Stamina;Understanding of Exercise Prescription Increase Physical Activity;Increase Strength and Stamina;Understanding of Exercise Prescription Increase Physical Activity;Increase Strength and Stamina;Understanding of Exercise Prescription   Comments Reviewed RPE and dyspnea scale, THR and program prescription with pt today.  Pt voiced understanding and was given a copy of goals to take home. -- Velinda is off to a good start in the program. He has been able to attend his first 2 sessions during this review period. During his sessions he was able to walk 9 laps on the track, and use the T4 nustep at level 2. We will continue to monitor his progress in the program. Velinda is off to a good start in the program. He increased to level 3 on the T4 nustep. He worked at level 1 on the recumbent bike and XR. We will continue to monitor his progress in the program. Velinda is is doing well in rehab and was only able to attend one session in this review. We will encourage him to be more consistent with rehab for exercise benefits. He decreased down to level 1 on the nustep in this session for 30 minutes. We will continue to monitor his progress in the program.   Expected Outcomes Short: Use RPE daily to regulate intensity.  Long: Follow program prescription in THR. -- Short: Continue to follow exercise prescription. Long: Continue exercise to improve strength and stamina. Short:  Continue to progressively increase T4 nustep workload. Long: Continue exercise to improve strength and stamina. Short: Be more consistent with rehab. Long: Continue exercise to improve strength and stamina.    Row Name 08/02/24 1656 08/15/24 1133           Exercise Goal Re-Evaluation   Exercise Goals Review Increase Physical Activity;Increase Strength and Stamina;Understanding of Exercise Prescription Increase Physical Activity;Increase Strength and Stamina;Understanding of Exercise Prescription      Comments Velinda is is doing well in rehab. He increased to level 2 on the T4 nustep. He maintained level 1 on the XR. He tried the treadmill and was able to do a speed of 0.48mph and no incline. We will continue to monitor his progress in the program. Velinda is is doing well in rehab. He has only worked on the T4 nustep since the last review. He continues to do well with 3 lb handweights for resistance training. We will continue to monitor his progress in the program.      Expected Outcomes Short: Continue to progressively increase XR and nustep workloads. Long: Continue exercise to improve strength and stamina. Short: Use other aerobic machines for exercise. Long: Continue exercise to improve strength and stamina.         Discharge Exercise Prescription (Final Exercise Prescription Changes):  Exercise Prescription Changes - 08/15/24 1100       Response to Exercise   Blood Pressure (Admit) 162/84    Blood Pressure (Exit) 132/72    Heart Rate (Admit) 94 bpm    Heart Rate (Exercise) 100 bpm    Heart Rate (Exit) 88 bpm    Oxygen Saturation (Admit) 95 %    Oxygen Saturation (Exercise) 94 %    Oxygen Saturation (Exit) 98 %    Rating of Perceived Exertion (Exercise) 13    Perceived Dyspnea (Exercise) 3    Symptoms none    Duration Progress to 30 minutes of  aerobic without signs/symptoms of physical distress    Intensity THRR unchanged      Progression   Progression Continue to  progress workloads to  maintain intensity without signs/symptoms of physical distress.    Average METs 1.45      Resistance Training   Training Prescription Yes    Weight 3 lb    Reps 10-15      Interval Training   Interval Training No      NuStep   Level 2    Minutes 30    METs 1.7      Oxygen   Maintain Oxygen Saturation 88% or higher          Nutrition:  Target Goals: Understanding of nutrition guidelines, daily intake of sodium 1500mg , cholesterol 200mg , calories 30% from fat and 7% or less from saturated fats, daily to have 5 or more servings of fruits and vegetables.  Education: Nutrition 1 -Group instruction provided by verbal, written material, interactive activities, discussions, models, and posters to present general guidelines for heart healthy nutrition including macronutrients, label reading, and promoting whole foods over processed counterparts. Education serves as Pensions consultant of discussion of heart healthy eating for all. Written material provided at class time.     Education: Nutrition 2 -Group instruction provided by verbal, written material, interactive activities, discussions, models, and posters to present general guidelines for heart healthy nutrition including sodium, cholesterol, and saturated fat. Providing guidance of habit forming to improve blood pressure, cholesterol, and body weight. Written material provided at class time.     Biometrics:  Pre Biometrics - 05/30/24 1524       Pre Biometrics   Height 5' 11 (1.803 m)    Weight 159 lb 14.4 oz (72.5 kg)    Waist Circumference 34 inches    Hip Circumference 36.5 inches    Waist to Hip Ratio 0.93 %    BMI (Calculated) 22.31    Single Leg Stand 0 seconds           Nutrition Therapy Plan and Nutrition Goals:   Nutrition Assessments:  MEDIFICTS Score Key: >=70 Need to make dietary changes  40-70 Heart Healthy Diet <= 40 Therapeutic Level Cholesterol Diet  Flowsheet Row Pulmonary Rehab from 01/18/2024 in  [facility] Cardiac and Pulmonary Rehab  Picture Your Plate Total Score on Admission 39   Picture Your Plate Scores: <59 Unhealthy dietary pattern with much room for improvement. 41-50 Dietary pattern unlikely to meet recommendations for good health and room for improvement. 51-60 More healthful dietary pattern, with some room for improvement.  >60 Healthy dietary pattern, although there may be some specific behaviors that could be improved.   Nutrition Goals Re-Evaluation:  Nutrition Goals Re-Evaluation     Row Name 07/19/24 1555             Goals   Comment Patient was informed on why it is important to maintain a balanced diet when dealing with Respiratory issues. Explained that it takes a lot of energy to breath and when they are short of breath often they will need to have a good diet to help keep up with the calories they are expending for breathing.       Expected Outcome Short: Choose and plan snacks accordingly to patients caloric intake to improve breathing. Long: Maintain a diet independently that meets their caloric intake to aid in daily shortness of breath.          Nutrition Goals Discharge (Final Nutrition Goals Re-Evaluation):  Nutrition Goals Re-Evaluation - 07/19/24 1555       Goals   Comment Patient was informed on why it is important to  maintain a balanced diet when dealing with Respiratory issues. Explained that it takes a lot of energy to breath and when they are short of breath often they will need to have a good diet to help keep up with the calories they are expending for breathing.    Expected Outcome Short: Choose and plan snacks accordingly to patients caloric intake to improve breathing. Long: Maintain a diet independently that meets their caloric intake to aid in daily shortness of breath.          Psychosocial: Target Goals: Acknowledge presence or absence of significant depression and/or stress, maximize coping skills, provide positive support system.  Participant is able to verbalize types and ability to use techniques and skills needed for reducing stress and depression.   Education: Stress, Anxiety, and Depression - Group verbal and visual presentation to define topics covered.  Reviews how body is impacted by stress, anxiety, and depression.  Also discusses healthy ways to reduce stress and to treat/manage anxiety and depression.  Written material provided at class time.   Education: Sleep Hygiene -Provides group verbal and written instruction about how sleep can affect your health.  Define sleep hygiene, discuss sleep cycles and impact of sleep habits. Review good sleep hygiene tips.    Initial Review & Psychosocial Screening:   Quality of Life Scores:  Scores of 19 and below usually indicate a poorer quality of life in these areas.  A difference of  2-3 points is a clinically meaningful difference.  A difference of 2-3 points in the total score of the Quality of Life Index has been associated with significant improvement in overall quality of life, self-image, physical symptoms, and general health in studies assessing change in quality of life.  PHQ-9: Review Flowsheet  More data exists      07/19/2024 05/30/2024 01/19/2024 03/10/2023 10/05/2022  Depression screen PHQ 2/9  Decreased Interest 3 2 2  0 0  Down, Depressed, Hopeless 2 2 0 1 1  PHQ - 2 Score 5 4 2 1 1   Altered sleeping 3 3 2  - -  Tired, decreased energy 3 3 3  - -  Change in appetite 3 3 2  - -  Feeling bad or failure about yourself  2 2 2  - -  Trouble concentrating 1 1 1  - -  Moving slowly or fidgety/restless 1 1 3  - -  Suicidal thoughts 0 0 0 - -  PHQ-9 Score 18 17 15  - -  Difficult doing work/chores Very difficult Very difficult Extremely dIfficult - -   Interpretation of Total Score  Total Score Depression Severity:  1-4 = Minimal depression, 5-9 = Mild depression, 10-14 = Moderate depression, 15-19 = Moderately severe depression, 20-27 = Severe depression    Psychosocial Evaluation and Intervention:   Psychosocial Re-Evaluation:  Psychosocial Re-Evaluation     Row Name 07/19/24 1600             Psychosocial Re-Evaluation   Current issues with Current Stress Concerns       Comments Reviewed patient health questionnaire (PHQ-9) with patient for follow up. Previously, patients score indicated signs/symptoms of depression.  Reviewed to see if patient is improving symptom wise while in program.  Score declined and patient states that it is because he has been getting worse with his lung issues. He is not able to do the things that he used to do and finds himself short of breath alot.       Expected Outcomes Short: Continue to work toward an improvement  in PHQ9 scores by attending [facility] regularly. Long: Continue to improve stress and depression coping skills by talking with staff and attending [facility] regularly and work toward a positive mental state.       Interventions Encouraged to attend Pulmonary Rehabilitation for the exercise       Continue Psychosocial Services  Follow up required by staff          Psychosocial Discharge (Final Psychosocial Re-Evaluation):  Psychosocial Re-Evaluation - 07/19/24 1600       Psychosocial Re-Evaluation   Current issues with Current Stress Concerns    Comments Reviewed patient health questionnaire (PHQ-9) with patient for follow up. Previously, patients score indicated signs/symptoms of depression.  Reviewed to see if patient is improving symptom wise while in program.  Score declined and patient states that it is because he has been getting worse with his lung issues. He is not able to do the things that he used to do and finds himself short of breath alot.    Expected Outcomes Short: Continue to work toward an improvement in PHQ9 scores by attending [facility] regularly. Long: Continue to improve stress and depression coping skills by talking with staff and attending [facility]  regularly and work toward a positive mental state.    Interventions Encouraged to attend Pulmonary Rehabilitation for the exercise    Continue Psychosocial Services  Follow up required by staff          Education: Education Goals: Education classes will be provided on a weekly basis, covering required topics. Participant will state understanding/return demonstration of topics presented.  Learning Barriers/Preferences:   General Pulmonary Education Topics:  Infection Prevention: - Provides verbal and written material to individual with discussion of infection control including proper hand washing and proper equipment cleaning during exercise session. Flowsheet Row Pulmonary Rehab from 05/30/2024 in [facility] Cardiac and Pulmonary Rehab  Date 05/30/24  Educator [facility]  Instruction Review Code 1- Verbalizes Understanding    Falls Prevention: - Provides verbal and written material to individual with discussion of falls prevention and safety. Flowsheet Row Pulmonary Rehab from 05/30/2024 in [facility] Cardiac and Pulmonary Rehab  Date 05/30/24  Educator [facility]  Instruction Review Code 1- Verbalizes Understanding    Chronic Lung Disease Review: - Group verbal instruction with posters, models, PowerPoint presentations and videos,  to review new updates, new respiratory medications, new advancements in procedures and treatments. Providing information on websites and 800 numbers for continued self-education. Includes information about supplement oxygen, available portable oxygen systems, continuous and intermittent flow rates, oxygen safety, concentrators, and Medicare reimbursement for oxygen. Explanation of Pulmonary Drugs, including class, frequency, complications, importance of spacers, rinsing mouth after steroid MDI's, and proper cleaning methods for nebulizers. Review of basic lung anatomy and physiology related to function, structure, and complications of lung disease. Review of risk factors. Discussion  about methods for diagnosing sleep apnea and types of masks and machines for OSA. Includes a review of the use of types of environmental controls: home humidity, furnaces, filters, dust mite/pet prevention, HEPA vacuums. Discussion about weather changes, air quality and the benefits of nasal washing. Instruction on Warning signs, infection symptoms, calling MD promptly, preventive modes, and value of vaccinations. Review of effective airway clearance, coughing and/or vibration techniques. Emphasizing that all should Create an Action Plan. Written material provided at class time. Flowsheet Row Pulmonary Rehab from 01/26/2024 in [facility] Cardiac and Pulmonary Rehab  Education need identified 01/26/24    AED/CPR: - Group verbal and written instruction with the use of models  to demonstrate the basic use of the AED with the basic ABC's of resuscitation.    Tests and Procedures:  - Group verbal and visual presentation and models provide information about basic cardiac anatomy and function. Reviews the testing methods done to diagnose heart disease and the outcomes of the test results. Describes the treatment choices: Medical Management, Angioplasty, or Coronary Bypass Surgery for treating various heart conditions including Myocardial Infarction, Angina, Valve Disease, and Cardiac Arrhythmias.  Written material provided at class time.   Medication Safety: - Group verbal and visual instruction to review commonly prescribed medications for heart and lung disease. Reviews the medication, class of the drug, and side effects. Includes the steps to properly store meds and maintain the prescription regimen.  Written material given at graduation.   Other: -Provides group and verbal instruction on various topics (see comments)   Knowledge Questionnaire Score:  Knowledge Questionnaire Score - 06/19/24 0842       Knowledge Questionnaire Score   Pre Score 14/18           Core Components/Risk Factors/Patient  Goals at Admission:  Personal Goals and Risk Factors at Admission - 05/30/24 1526       Core Components/Risk Factors/Patient Goals on Admission   Tobacco Cessation Yes    Number of packs per day <2 ppd    Intervention Assist the participant in steps to quit. Provide individualized education and counseling about committing to Tobacco Cessation, relapse prevention, and pharmacological support that can be provided by physician.;Education officer, environmental, assist with locating and accessing local/national Quit Smoking programs, and support quit date choice.    Expected Outcomes Short Term: Will demonstrate readiness to quit, by selecting a quit date.;Short Term: Will quit all tobacco product use, adhering to prevention of relapse plan.;Long Term: Complete abstinence from all tobacco products for at least 12 months from quit date.    Improve shortness of breath with ADL's Yes    Intervention Provide education, individualized exercise plan and daily activity instruction to help decrease symptoms of SOB with activities of daily living.    Expected Outcomes Short Term: Improve cardiorespiratory fitness to achieve a reduction of symptoms when performing ADLs;Long Term: Be able to perform more ADLs without symptoms or delay the onset of symptoms    Hypertension Yes    Intervention Provide education on lifestyle modifcations including regular physical activity/exercise, weight management, moderate sodium restriction and increased consumption of fresh fruit, vegetables, and low fat dairy, alcohol moderation, and smoking cessation.;Monitor prescription use compliance.    Expected Outcomes Short Term: Continued assessment and intervention until BP is < 140/59mm HG in hypertensive participants. < 130/9mm HG in hypertensive participants with diabetes, heart failure or chronic kidney disease.;Long Term: Maintenance of blood pressure at goal levels.    Lipids Yes    Intervention Provide education and support for  participant on nutrition & aerobic/resistive exercise along with prescribed medications to achieve LDL 70mg , HDL >40mg .    Expected Outcomes Short Term: Participant states understanding of desired cholesterol values and is compliant with medications prescribed. Participant is following exercise prescription and nutrition guidelines.;Long Term: Cholesterol controlled with medications as prescribed, with individualized exercise RX and with personalized nutrition plan. Value goals: LDL < 70mg , HDL > 40 mg.          Education:Diabetes - Individual verbal and written instruction to review signs/symptoms of diabetes, desired ranges of glucose level fasting, after meals and with exercise. Acknowledge that pre and post exercise glucose checks will be done for  3 sessions at entry of program.   Know Your Numbers and Heart Failure: - Group verbal and visual instruction to discuss disease risk factors for cardiac and pulmonary disease and treatment options.  Reviews associated critical values for Overweight/Obesity, Hypertension, Cholesterol, and Diabetes.  Discusses basics of heart failure: signs/symptoms and treatments.  Introduces Heart Failure Zone chart for action plan for heart failure. Written material provided at class time.   Core Components/Risk Factors/Patient Goals Review:   Goals and Risk Factor Review     Row Name 07/19/24 1554             Core Components/Risk Factors/Patient Goals Review   Personal Goals Review Improve shortness of breath with ADL's       Review Spoke to patient about their shortness of breath and what they can do to improve. Patient has been informed of breathing techniques when starting the program. Patient is informed to tell staff if they have had any med changes and that certain meds they are taking or not taking can be causing shortness of breath.       Expected Outcomes Short: Attend [facility] regularly to improve shortness of breath with ADL's. Long: maintain  independence with ADL's          Core Components/Risk Factors/Patient Goals at Discharge (Final Review):   Goals and Risk Factor Review - 07/19/24 1554       Core Components/Risk Factors/Patient Goals Review   Personal Goals Review Improve shortness of breath with ADL's    Review Spoke to patient about their shortness of breath and what they can do to improve. Patient has been informed of breathing techniques when starting the program. Patient is informed to tell staff if they have had any med changes and that certain meds they are taking or not taking can be causing shortness of breath.    Expected Outcomes Short: Attend [facility] regularly to improve shortness of breath with ADL's. Long: maintain independence with ADL's          ITP Comments:  ITP Comments     Row Name 05/30/24 1516 06/11/24 1404 06/27/24 0945 07/25/24 1409 08/22/24 1039   ITP Comments Completed and gym orientation for pulmonary rehab. Initial ITP created and sent for review to Dr. Faud Aleskerov, Medical Director. First full day of exercise!  Patient was oriented to gym and equipment including functions, settings, policies, and procedures.  Patient's individual exercise prescription and treatment plan were reviewed.  All starting workloads were established based on the results of the 6 minute walk test done at initial orientation visit.  The plan for exercise progression was also introduced and progression will be customized based on patient's performance and goals. 30 Day review completed. Medical Director ITP review done; changes made as directed and signed approval by Medical Director. New to program. 30 Day review completed. Medical Director ITP review done; changes made as directed and signed approval by Medical Director. 30 Day review completed. Medical Director ITP review done; changes made as directed and signed approval by Medical Director.      Comments: 30 day review

## 2024-08-23 ENCOUNTER — Encounter

## 2024-08-24 NOTE — Telephone Encounter (Signed)
 [employer] dialogue so far:   Lang Exie PARAS, CMA  Coltrane, Veva Was the ONO (overnight pulse ox) test ever received? It was sent 08/02/24       Previous Messages    ----- Message ----- From: Dartha Veva Sent: 08/24/2024  10:21 AM EDT To: Donzell GORMAN Forde Exie PARAS Lang, CMA Subject: RE: wyvonne? and other questions              This patient had O2 with our [facility] center, but it was picked up on 10/10/23. Not sure who he has CPAP through. ----- Message ----- From: Lang Exie PARAS, CMA Sent: 08/21/2024  11:53 AM EDT To: Veva Dartha; Donzell GORMAN Finder Subject: wyvonne? and other questions                  Hi Terri,  For this patient, is he still established with you guys for his CPAP? Pt is reporting be serviced by you guys for his POC and CPAP. We've also sent in an order for him to complete an ONO through you guys and we wanted to confirm you have the order.  Thanks,  Z

## 2024-08-27 ENCOUNTER — Encounter

## 2024-08-28 ENCOUNTER — Ambulatory Visit
Admission: RE | Admit: 2024-08-28 | Discharge: 2024-08-28 | Disposition: A | Discharge status: Home / Self Care | Source: Ambulatory Visit | Attending: Oncology | Admitting: Oncology

## 2024-08-28 DIAGNOSIS — C3491 Malignant neoplasm of unspecified part of right bronchus or lung: Secondary | ICD-10-CM | POA: Diagnosis present

## 2024-08-28 MED ORDER — IOHEXOL 300 MG/ML  SOLN
75.0000 mL | Freq: Once | INTRAMUSCULAR | Status: AC | PRN
Start: 2024-08-28 — End: 2024-08-28
  Administered 2024-08-28: 75 mL via INTRAVENOUS

## 2024-08-29 NOTE — Telephone Encounter (Signed)
 Per Tommye Forward with [facility] in [place] the ONO order was sent to [facility] in [place]. I called [facility] in [place] and they stated they didn't have the order. I ask if they still had access to Epic the person stated that only 2 people have access. I ask if they could pull the ONO order. Well I ended up faxing the ONO order along with last notes to [facility] on 08/24/24

## 2024-08-30 ENCOUNTER — Encounter: Admitting: Emergency Medicine

## 2024-08-30 DIAGNOSIS — J449 Chronic obstructive pulmonary disease, unspecified: Secondary | ICD-10-CM | POA: Diagnosis not present

## 2024-08-30 NOTE — Progress Notes (Signed)
 Daily Session Note  Patient Details  Name: Paul Carpenter MRN: [id number] Date of Birth: 1956-02-01 Referring Provider:   Conrad Ports Pulmonary Rehab from 05/30/2024 in [facility] Cardiac and Pulmonary Rehab  Referring Provider Dr. Dedra Sanders    Encounter Date: 08/30/2024  Check In:  Session Check In - 08/30/24 1533       Check-In   Supervising physician immediately available to respond to emergencies See telemetry face sheet for immediately available ER MD    Location [facility]-Cardiac & Pulmonary Rehab    Staff Present Devaughn Jaeger, BS, Exercise Physiologist;Jodeci Roarty RN,BSN;Maxon Jolivue BS, Exercise Physiologist;Joseph Rolinda RCP,RRT,BSRT    Virtual Visit No    Medication changes reported     No    Fall or balance concerns reported    No    Tobacco Cessation Use Decreased    Current number of cigarettes/nicotine  per day     12    Warm-up and Cool-down Performed on first and last piece of equipment    Resistance Training Performed Yes    VAD Patient? No    PAD/SET Patient? No      Pain Assessment   Currently in Pain? No/denies             Social History   Tobacco Use  Smoking Status Every Day   Current packs/day: 1.50   Average packs/day: 3.0 packs/day for 52.8 years (156.7 ttl pk-yrs)   Types: Cigarettes   Start date: 1973   Passive exposure: Current  Smokeless Tobacco Never    Goals Met:  Proper associated with RPD/PD & O2 Sat Independence with exercise equipment Using PLB without cueing & demonstrates good technique Exercise tolerated well No report of concerns or symptoms today Strength training completed today  Goals Unmet:  Not Applicable  Comments: Pt able to follow exercise prescription today without complaint.  Will continue to monitor for progression.    Dr. Oneil Pinal is Medical Director for [facility] Cardiac Rehabilitation.  Dr. Fuad Aleskerov is Medical Director for [facility] Pulmonary Rehabilitation.

## 2024-09-03 ENCOUNTER — Encounter

## 2024-09-04 ENCOUNTER — Encounter: Payer: Self-pay | Admitting: Pulmonary Disease

## 2024-09-04 NOTE — Telephone Encounter (Signed)
 Patient reports,  he did receive and has completed ONO. He return kit, two nights ago. Advised patient, we will call with results, once Dr. Tamea, reviews them. NFN.

## 2024-09-05 ENCOUNTER — Encounter: Payer: Self-pay | Admitting: Oncology

## 2024-09-05 ENCOUNTER — Inpatient Hospital Stay: Attending: Oncology | Admitting: Oncology

## 2024-09-05 VITALS — BP 132/86 | HR 98 | Temp 97.3°F | Resp 18 | Ht 72.0 in | Wt 162.0 lb

## 2024-09-05 DIAGNOSIS — E039 Hypothyroidism, unspecified: Secondary | ICD-10-CM | POA: Diagnosis not present

## 2024-09-05 DIAGNOSIS — Z923 Personal history of irradiation: Secondary | ICD-10-CM | POA: Diagnosis not present

## 2024-09-05 DIAGNOSIS — Z7982 Long term (current) use of aspirin: Secondary | ICD-10-CM | POA: Insufficient documentation

## 2024-09-05 DIAGNOSIS — E871 Hypo-osmolality and hyponatremia: Secondary | ICD-10-CM | POA: Diagnosis not present

## 2024-09-05 DIAGNOSIS — I2510 Atherosclerotic heart disease of native coronary artery without angina pectoris: Secondary | ICD-10-CM | POA: Insufficient documentation

## 2024-09-05 DIAGNOSIS — F418 Other specified anxiety disorders: Secondary | ICD-10-CM | POA: Diagnosis not present

## 2024-09-05 DIAGNOSIS — Z85118 Personal history of other malignant neoplasm of bronchus and lung: Secondary | ICD-10-CM | POA: Diagnosis present

## 2024-09-05 DIAGNOSIS — I110 Hypertensive heart disease with heart failure: Secondary | ICD-10-CM | POA: Insufficient documentation

## 2024-09-05 DIAGNOSIS — Z7951 Long term (current) use of inhaled steroids: Secondary | ICD-10-CM | POA: Insufficient documentation

## 2024-09-05 DIAGNOSIS — Z8042 Family history of malignant neoplasm of prostate: Secondary | ICD-10-CM | POA: Insufficient documentation

## 2024-09-05 DIAGNOSIS — Z860101 Personal history of adenomatous and serrated colon polyps: Secondary | ICD-10-CM | POA: Diagnosis not present

## 2024-09-05 DIAGNOSIS — F17210 Nicotine dependence, cigarettes, uncomplicated: Secondary | ICD-10-CM | POA: Insufficient documentation

## 2024-09-05 DIAGNOSIS — Z79899 Other long term (current) drug therapy: Secondary | ICD-10-CM | POA: Diagnosis not present

## 2024-09-05 DIAGNOSIS — C3491 Malignant neoplasm of unspecified part of right bronchus or lung: Secondary | ICD-10-CM

## 2024-09-05 NOTE — Progress Notes (Unsigned)
 [facility]  [phone]) [phone] Fax:(336) [phone]  ID: Paul Carpenter OB: 02/14/1956  MR#: [id number]  RDW#:[id number]  Patient Care Team: Fayette Bodily, MD as PCP - General (Infectious Diseases) Dew, Selinda RAMAN, MD as Referring Physician (Vascular Surgery) Jacobo Paul PARAS, MD as Consulting Physician (Oncology) Lenn Aran, MD as Referring Physician (Radiation Oncology) Tamea Dedra CROME, MD as Consulting Physician (Pulmonary Disease) Fayette Bodily, MD as Consulting Physician (Infectious Diseases) Katie Vinie LOISE, MD as Referring Physician (Pulmonary Disease)   CHIEF COMPLAINT: Recurrent stage IIIa non-small cell lung cancer, right middle lobe.  INTERVAL HISTORY: Patient returns to clinic today for routine 44-month evaluation and discussion of his imaging results.  He continues to have decreased performance status, chronic cough, and chronic shortness of breath.  He has increased chest pain secondary to recently fractured ribs.  He no longer is taking IV antibiotics.  He uses supplemental oxygen intermittently.  He has chronic weakness and fatigue.  He continues to smoke and drink alcohol.  He has no neurologic complaints.  He denies any fevers. He denies any nausea, vomiting, constipation, or diarrhea. He has no urinary complaints.  Patient offers no further specific complaints today.  REVIEW OF SYSTEMS:   Review of Systems  Constitutional:  Positive for malaise/fatigue. Negative for fever and weight loss.  HENT: Negative.  Negative for congestion and sore throat.   Respiratory:  Positive for cough and shortness of breath. Negative for hemoptysis.   Cardiovascular:  Positive for chest pain. Negative for leg swelling.  Gastrointestinal: Negative.  Negative for abdominal pain, blood in stool, melena and nausea.  Genitourinary: Negative.  Negative for dysuria.  Musculoskeletal: Negative.  Negative for joint pain and neck pain.  Skin:  Negative.  Negative for rash.  Neurological:  Positive for weakness. Negative for dizziness, sensory change, focal weakness and headaches.  Psychiatric/Behavioral:  Positive for depression. The patient is not nervous/anxious.     As per HPI. Otherwise, a complete review of systems is negative.  PAST MEDICAL HISTORY: Past Medical History:  Diagnosis Date   Anginal pain    Anxiety    Asthma    Chest pain    CHF (congestive heart failure) (HCC)    Chicken pox    Complication of anesthesia    o2 dropped after neck fusion   COPD (chronic obstructive pulmonary disease) (HCC)    Coronary artery disease    Cough    chronic  clear phlegm   Dysrhythmia    palpitations   GERD (gastroesophageal reflux disease)    h/o reflux/ hoarsness   Hematochezia    Hemorrhoids    History of chickenpox    History of colon polyps    History of Helicobacter pylori infection    Hoarseness    Hypertension    Lung cancer (HCC) 05/2016   Chemo + rad tx's.    Migraines    OSA (obstructive sleep apnea)    has CPAP but does not use   Personal history of tobacco use, presenting hazards to health 03/05/2016   Pneumonia    5/17   Raynaud disease    Raynaud disease    Raynaud's disease    Rotator cuff tear    on right   Shortness of breath dyspnea    Sleep apnea    Ulcer (traumatic) of oral mucosa     PAST SURGICAL HISTORY: Past Surgical History:  Procedure Laterality Date   BACK SURGERY     cervical fusion x 2  CARDIAC CATHETERIZATION     CERVICAL DISCECTOMY     x 2   COLONOSCOPY     COLONOSCOPY N/A 07/25/2015   Procedure: COLONOSCOPY;  Surgeon: Gladis RAYMOND Mariner, MD;  Location: [facility] ENDOSCOPY;  Service: Endoscopy;  Laterality: N/A;   COLONOSCOPY WITH PROPOFOL  N/A 10/04/2017   Procedure: COLONOSCOPY WITH PROPOFOL ;  Surgeon: Mariner Gladis RAYMOND, MD;  Location: [facility] ENDOSCOPY;  Service: Endoscopy;  Laterality: N/A;   COLONOSCOPY WITH PROPOFOL  N/A 07/10/2020   Procedure: COLONOSCOPY WITH PROPOFOL ;   Surgeon: Toledo, Ladell POUR, MD;  Location: [facility] ENDOSCOPY;  Service: Gastroenterology;  Laterality: N/A;   ELECTROMAGNETIC NAVIGATION BROCHOSCOPY Left 06/28/2016   Procedure: ELECTROMAGNETIC NAVIGATION BRONCHOSCOPY;  Surgeon: Jorie Cha, MD;  Location: [facility] ORS;  Service: Cardiopulmonary;  Laterality: Left;   ENDOBRONCHIAL ULTRASOUND N/A 04/11/2018   Procedure: ENDOBRONCHIAL ULTRASOUND;  Surgeon: Isaiah Scrivener, MD;  Location: [facility] ORS;  Service: Cardiopulmonary;  Laterality: N/A;   ESOPHAGOGASTRODUODENOSCOPY N/A 07/25/2015   Procedure: ESOPHAGOGASTRODUODENOSCOPY (EGD);  Surgeon: Gladis RAYMOND Mariner, MD;  Location: [facility] ENDOSCOPY;  Service: Endoscopy;  Laterality: N/A;   ESOPHAGOGASTRODUODENOSCOPY (EGD) WITH PROPOFOL  N/A 07/10/2020   Procedure: ESOPHAGOGASTRODUODENOSCOPY (EGD) WITH PROPOFOL ;  Surgeon: Toledo, Ladell POUR, MD;  Location: [facility] ENDOSCOPY;  Service: Gastroenterology;  Laterality: N/A;   NASAL SINUS SURGERY     x 2    PORTA CATH INSERTION N/A 04/24/2018   Procedure: PORTA CATH INSERTION;  Surgeon: Marea Selinda RAMAN, MD;  Location: [facility] INVASIVE CV LAB;  Service: Cardiovascular;  Laterality: N/A;   PORTA CATH REMOVAL N/A 03/08/2024   Procedure: PORTA CATH REMOVAL;  Surgeon: Marea Selinda RAMAN, MD;  Location: [facility] INVASIVE CV LAB;  Service: Cardiovascular;  Laterality: N/A;   rotator cuff surgery Right    07/2016   SEPTOPLASTY     SKIN GRAFT     UPPER EXTREMITY VENOGRAPHY Right 04/16/2024   Procedure: UPPER EXTREMITY VENOGRAPHY;  Surgeon: Marea Selinda RAMAN, MD;  Location: [facility] INVASIVE CV LAB;  Service: Cardiovascular;  Laterality: Right;    FAMILY HISTORY Family History  Problem Relation Age of Onset   Heart disease Father    Prostate cancer Father    Heart disease Paternal Grandmother    Heart attack Maternal Grandfather 27   Kidney cancer Neg Hx    Bladder Cancer Neg Hx    Other Neg Hx        pituitary abnormality       ADVANCED DIRECTIVES:    HEALTH MAINTENANCE: Social History   Tobacco  Use   Smoking status: Every Day    Current packs/day: 1.50    Average packs/day: 3.0 packs/day for 52.8 years (156.8 ttl pk-yrs)    Types: Cigarettes    Start date: 1973    Passive exposure: Current   Smokeless tobacco: Never  Vaping Use   Vaping status: Never Used  Substance Use Topics   Alcohol use: Not Currently    Alcohol/week: 2.0 standard drinks of alcohol    Types: 2 Standard drinks or equivalent per week    Comment: moderate   Drug use: No     Allergies  Allergen Reactions   Lisinopril Rash   Varenicline  Rash    Current Outpatient Medications  Medication Sig Dispense Refill   acetaminophen  (TYLENOL ) 500 MG tablet Take 500 mg by mouth daily as needed.     albuterol  (PROVENTIL ) (2.5 MG/3ML) 0.083% nebulizer solution Take 3 mLs (2.5 mg total) by nebulization 4 (four) times daily as needed for wheezing or shortness of breath. 75 mL 11  albuterol  (VENTOLIN  HFA) 108 (90 Base) MCG/ACT inhaler INHALE 2 PUFFS BY MOUTH EVERY 4 HOURS AS NEEDED FOR WHEEZE OR FOR SHORTNESS OF BREATH 18 each 1   arformoterol  (BROVANA ) 15 MCG/2ML NEBU Take 2 mLs (15 mcg total) by nebulization 2 (two) times daily. 120 mL 11   aspirin EC 81 MG tablet Take 81 mg by mouth daily. Swallow whole.     atorvastatin  (LIPITOR) 10 MG tablet Take 10 mg by mouth daily.     azithromycin  (ZITHROMAX ) 500 MG tablet Take 500 mg by mouth.     budesonide  (PULMICORT ) 0.5 MG/2ML nebulizer solution Take 2 mLs (0.5 mg total) by nebulization 2 (two) times daily. 360 mL 3   clotrimazole  (MYCELEX ) 10 MG troche Take 10 mg by mouth 3 (three) times daily.     cyanocobalamin (VITAMIN B12) 1000 MCG/ML injection Inject 1,000 mcg into the muscle.     diazepam  (VALIUM ) 5 MG tablet Take 5 mg by mouth at bedtime as needed.     ferrous sulfate  325 (65 FE) MG tablet Take 325 mg by mouth at bedtime.     Fluocinolone Acetonide 0.01 % OIL PLEASE SEE ATTACHED FOR DETAILED DIRECTIONS     folic acid  (FOLVITE ) 1 MG tablet Take 1 mg by mouth  daily.     guaiFENesin  (MUCINEX ) 600 MG 12 hr tablet Take 1 tablet (600 mg total) by mouth 2 (two) times daily. 60 tablet 0   hydrocortisone  (CORTEF ) 10 MG tablet Take 2 tablets in the morning and 1 tablet mid afternoon (2 to 4 PM) 90 tablet 0   levothyroxine  (SYNTHROID ) 175 MCG tablet Take 175 mcg by mouth daily before breakfast.     lidocaine -prilocaine  (EMLA ) cream APPLY 1 APPLICATION TOPICALLY AS NEEDED. 30 g 1   magnesium  oxide (MAG-OX) 400 MG tablet Take 800 mg by mouth 2 (two) times daily.     naproxen  sodium (ANAPROX  DS) 550 MG tablet Take 1 tablet (550 mg total) by mouth 2 (two) times daily as needed for moderate pain (pain score 4-6). Take with meals 30 tablet 1   Nebulizers (COMPRESSOR/NEBULIZER) MISC Use as directed for nebulized medications. 1 each 0   nicotine  (NICODERM CQ  - DOSED IN MG/24 HOURS) 21 mg/24hr patch Place 21 mg onto the skin daily.     nitroGLYCERIN  (NITROSTAT ) 0.4 MG SL tablet Place under the tongue.     OXYGEN Inhale 2 L into the lungs at bedtime.     pantoprazole  (PROTONIX ) 40 MG tablet Take 1 tablet (40 mg total) by mouth 2 (two) times daily before meals     promethazine -dextromethorphan  (PROMETHAZINE -DM) 6.25-15 MG/5ML syrup Take 5 mLs by mouth 4 (four) times daily as needed for cough. 180 mL 1   sodium chloride  1 g tablet Take 5 tablets (5 g total) by mouth 3 (three) times daily with meals.     tadalafil  (CIALIS ) 5 MG tablet Take 1 tablet (5 mg total) by mouth daily as needed for erectile dysfunction. 90 tablet 3   Vitamin D, Ergocalciferol, (DRISDOL) 1.25 MG (50000 UNIT) CAPS capsule Take 50,000 Units by mouth once a week.     YUPELRI  175 MCG/3ML nebulizer solution TAKE 3 MLS (175 MCG TOTAL) BY NEBULIZATION DAILY. DX: J44.9 90 mL 11   chlorpheniramine-HYDROcodone  (TUSSIONEX) 10-8 MG/5ML Take 5 mLs by mouth every 12 (twelve) hours as needed for cough. (Patient not taking: Reported on 09/05/2024) 115 mL 0   melatonin 3 MG TABS tablet Take 3 mg by mouth at bedtime as  needed. (Patient  not taking: Reported on 09/05/2024)     traMADol  (ULTRAM ) 50 MG tablet Take 50 mg by mouth every 6 (six) hours as needed. (Patient not taking: Reported on 09/05/2024)     No current facility-administered medications for this visit.   Facility-Administered Medications Ordered in Other Visits  Medication Dose Route Frequency Provider Last Rate Last Admin   heparin  lock flush 100 unit/mL  500 Units Intravenous Once Thomasa Heidler J, MD        OBJECTIVE: Vitals:   09/05/24 0952  BP: 132/86  Pulse: 98  Resp: 18  Temp: (!) 97.3 F (36.3 C)  SpO2: 98%      Body mass index is 21.97 kg/m.    ECOG FS:2 - Symptomatic, <50% confined to bed  General: Ill-appearing, no acute distress. Eyes: Pink conjunctiva, anicteric sclera. HEENT: Normocephalic, moist mucous membranes. Lungs: No audible wheezing or coughing. Heart: Regular rate and rhythm. Abdomen: Soft, nontender, no obvious distention. Musculoskeletal: No edema, cyanosis, or clubbing. Neuro: Alert, answering all questions appropriately. Cranial nerves grossly intact. Skin: No rashes or petechiae noted. Psych: Normal affect.  LAB RESULTS:  Lab Results  Component Value Date   NA 134 (L) 06/11/2024   K 4.3 06/11/2024   CL 95 (L) 06/11/2024   CO2 30 06/11/2024   GLUCOSE 99 06/11/2024   BUN 8 06/11/2024   CREATININE 0.71 06/11/2024   CALCIUM  8.8 (L) 06/11/2024   PROT 6.5 06/11/2024   ALBUMIN 3.6 06/11/2024   AST 22 06/11/2024   ALT 16 06/11/2024   ALKPHOS 79 06/11/2024   BILITOT 0.6 06/11/2024   GFRNONAA >60 06/11/2024   GFRAA >60 04/24/2020    Lab Results  Component Value Date   WBC 8.2 06/11/2024   NEUTROABS 7.0 06/11/2024   HGB 12.9 (L) 06/11/2024   HCT 38.1 (L) 06/11/2024   MCV 92.3 06/11/2024   PLT 240 06/11/2024   Lab Results  Component Value Date   IRON  49 12/28/2021   TIBC 241 (L) 12/28/2021   IRONPCTSAT 20 12/28/2021   Lab Results  Component Value Date   FERRITIN 318 11/20/2021      STUDIES:  ONCOLOGY HISTORY: Case discussed with pathologist and unable to determine whether this is adenocarcinoma or squamous cell carcinoma.  There is also insufficient tissue to do further testing.  Liquid biopsy did not reveal any actionable mutations.  MRI of the brain completed on Mar 28, 2018 reviewed independently did not reveal metastatic disease.  Patient completed XRT June 26, 2018.  He completed his concurrent single agent carboplatinum on June 21, 2018.  Patient had a reaction to Taxol  during cycle 1 and this was discontinued.  He completed a year-long of maintenance durvalumab  on June 27, 2019.   PET scan results from November 16, 2021 reviewed independently with once again suspicion of progression of disease.  After lengthy discussion with the patient, it was agreed that no biopsy is necessary and may be too difficult given his recent C2 cervical fracture.  Patient and family expressed interest in systemic chemotherapy.  Patient received his fourth and final cycle of Taxotere  on January 28, 2022.    ASSESSMENT: Recurrent stage IIIa non-small cell lung cancer, right middle lobe.  PLAN:    Recurrent stage IIIa non-small cell lung cancer, right middle lobe: See oncology history above.  Patient's most recent CT scan on August 28, 2024 reviewed independently and reported as above with no obvious evidence of recurrent or progressive disease.  He continues to have an unchanged right upper  lobe cavitary lesion and other persistent abnormalities consistent with COPD and his ongoing MAC infection.  Continue to follow-up with pulmonary and infectious disease as scheduled.  Return to clinic in 6 months with repeat imaging and further evaluation at which point if CT remains stable, patient can be transition to yearly evaluation.   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  Follow-up with ID as scheduled. Iron  deficiency anemia: Resolved.  He last received IV Venofer  on  December 03, 2021.   Colon polyps:  Patient has a personal history of greater then 10 adenomatous polyps on his most recent conoloscopy. He does not know of any family history of increased polyps or colon cancer.  Genetic testing to assess for the APC mutation for FAP or AFAP was negative. Continue colonoscopies as per GI. Tobacco Use: Chronic and unchanged.  Patient continues to smoke on a daily basis.  He previously expressed understanding by continuing tobacco use increases his chance of recurrence. Anxiety/depression: Chronic and unchanged.  Continue monitoring and treatment per primary care.   Hyponatremia: Mild.  Continue salt tablets as prescribed by nephrology.  Patient was previously counseled on alcohol consumption and how it can act as a diuretic and reduce sodium levels.  Follow-up with nephrology as scheduled. Hypothyroidism: Appreciate endocrinology input.  Continue Synthroid  as prescribed.  Follow-up with endocrinology as scheduled. CHF: Patient's most recent cardiac echo on November 10, 2023 revealed an EF of approximately 45% which is slightly improved.  Continue follow-up with cardiology.   History of C2 cervical fracture: Patient had repeat imaging on May 03, 2022 that revealed complete resolution of fracture.   Cough/shortness of breath: Chronic and unchanged.  Continue oxygen as needed.  Patient also continues to smoke.  Follow-up with pulmonary as needed.  Patient reportedly also discussed possible hospice with pulmonary given his end-stage COPD. Port: Port has been removed.   Patient expressed understanding and was in agreement with this plan. He also understands that He can call clinic at any time with any questions, concerns, or complaints.    Paul JINNY Reusing, MD   09/06/2024 7:06 AM

## 2024-09-05 NOTE — Progress Notes (Unsigned)
 Patient is having severe pain from coughing so much, which he has a couple of cracked ribs. He isn't currently taking anything for pain, due to him not having anything. Doesn't sleep good at night, and he has no appetite. He has also been coughing up some blood every now and then.

## 2024-09-06 ENCOUNTER — Encounter: Attending: Pulmonary Disease | Admitting: Emergency Medicine

## 2024-09-06 ENCOUNTER — Encounter: Payer: Self-pay | Admitting: Oncology

## 2024-09-06 DIAGNOSIS — J449 Chronic obstructive pulmonary disease, unspecified: Secondary | ICD-10-CM | POA: Insufficient documentation

## 2024-09-06 NOTE — Progress Notes (Signed)
 Daily Session Note  Patient Details  Name: Paul Carpenter MRN: [id number] Date of Birth: [birth year] Referring Provider:   [facility] 05/30/2024 in [facility]  Referring Provider Dr. Dedra Sanders    Encounter Date: 09/06/2024  Check In:  Session Check In - 09/06/24 1555       Check-In   Supervising physician immediately available to respond to emergencies See telemetry face sheet for immediately available ER MD    Location [facility]-Cardiac & Pulmonary Rehab    Staff Present Devaughn Jaeger, BS, Exercise Physiologist;Gissella Niblack RN,BSN;[employer], MS, Exercise Physiologist    Virtual Visit No    Medication changes reported     No    Fall or balance concerns reported    No    Tobacco Cessation No Change    Warm-up and Cool-down Performed on first and last piece of equipment    Resistance Training Performed Yes    VAD Patient? No    PAD/SET Patient? No      Pain Assessment   Currently in Pain? No/denies             Social History   Tobacco Use  Smoking Status Every Day   Current packs/day: 1.50   Average packs/day: 3.0 packs/day for 52.8 years (156.8 ttl pk-yrs)   Types: Cigarettes   Start date: 1973   Passive exposure: Current  Smokeless Tobacco Never    Goals Met:  Proper associated with RPD/PD & O2 Sat Independence with exercise equipment Using PLB without cueing & demonstrates good technique Exercise tolerated well No report of concerns or symptoms today Strength training completed today  Goals Unmet:  Not Applicable  Comments: Pt able to follow exercise prescription today without complaint.  Will continue to monitor for progression.    Dr. Oneil Pinal is Medical Director for [facility].  Dr. Fuad Aleskerov is Medical Director for [facility]monary [facility].

## 2024-09-10 ENCOUNTER — Ambulatory Visit: Payer: Self-pay | Admitting: Pulmonary Disease

## 2024-09-10 ENCOUNTER — Encounter

## 2024-09-13 ENCOUNTER — Encounter

## 2024-09-17 ENCOUNTER — Encounter

## 2024-09-19 DIAGNOSIS — J449 Chronic obstructive pulmonary disease, unspecified: Secondary | ICD-10-CM

## 2024-09-19 NOTE — Progress Notes (Signed)
 Pulmonary Individual Treatment Plan  Patient Details  Name: Paul Carpenter MRN: [id number] Date of Birth: January 29, 1956 Referring Provider:   Conrad Ports Pulmonary Rehab from 05/30/2024 in [facility] Cardiac and Pulmonary Rehab  Referring Provider Dr. Dedra Sanders    Initial Encounter Date:  Flowsheet Row Pulmonary Rehab from 05/30/2024 in [facility] Cardiac and Pulmonary Rehab  Date 05/30/24    Visit Diagnosis: Chronic obstructive pulmonary disease, unspecified COPD type (HCC)  Patient's Home Medications on Admission:  Current Outpatient Medications:    acetaminophen  (TYLENOL ) 500 MG tablet, Take 500 mg by mouth daily as needed., Disp: , Rfl:    albuterol  (PROVENTIL ) (2.5 MG/3ML) 0.083% nebulizer solution, Take 3 mLs (2.5 mg total) by nebulization 4 (four) times daily as needed for wheezing or shortness of breath., Disp: 75 mL, Rfl: 11   albuterol  (VENTOLIN  HFA) 108 (90 Base) MCG/ACT inhaler, INHALE 2 PUFFS BY MOUTH EVERY 4 HOURS AS NEEDED FOR WHEEZE OR FOR SHORTNESS OF BREATH, Disp: 18 each, Rfl: 1   arformoterol  (BROVANA ) 15 MCG/2ML NEBU, Take 2 mLs (15 mcg total) by nebulization 2 (two) times daily., Disp: 120 mL, Rfl: 11   aspirin EC 81 MG tablet, Take 81 mg by mouth daily. Swallow whole., Disp: , Rfl:    atorvastatin  (LIPITOR) 10 MG tablet, Take 10 mg by mouth daily., Disp: , Rfl:    azithromycin  (ZITHROMAX ) 500 MG tablet, Take 500 mg by mouth., Disp: , Rfl:    budesonide  (PULMICORT ) 0.5 MG/2ML nebulizer solution, Take 2 mLs (0.5 mg total) by nebulization 2 (two) times daily., Disp: 360 mL, Rfl: 3   chlorpheniramine-HYDROcodone  (TUSSIONEX) 10-8 MG/5ML, Take 5 mLs by mouth every 12 (twelve) hours as needed for cough. (Patient not taking: Reported on 09/05/2024), Disp: 115 mL, Rfl: 0   clotrimazole  (MYCELEX ) 10 MG troche, Take 10 mg by mouth 3 (three) times daily., Disp: , Rfl:    cyanocobalamin (VITAMIN B12) 1000 MCG/ML injection, Inject 1,000 mcg into the muscle., Disp: , Rfl:    diazepam   (VALIUM ) 5 MG tablet, Take 5 mg by mouth at bedtime as needed., Disp: , Rfl:    ferrous sulfate  325 (65 FE) MG tablet, Take 325 mg by mouth at bedtime., Disp: , Rfl:    Fluocinolone Acetonide 0.01 % OIL, PLEASE SEE ATTACHED FOR DETAILED DIRECTIONS, Disp: , Rfl:    folic acid  (FOLVITE ) 1 MG tablet, Take 1 mg by mouth daily., Disp: , Rfl:    guaiFENesin  (MUCINEX ) 600 MG 12 hr tablet, Take 1 tablet (600 mg total) by mouth 2 (two) times daily., Disp: 60 tablet, Rfl: 0   hydrocortisone  (CORTEF ) 10 MG tablet, Take 2 tablets in the morning and 1 tablet mid afternoon (2 to 4 PM), Disp: 90 tablet, Rfl: 0   levothyroxine  (SYNTHROID ) 175 MCG tablet, Take 175 mcg by mouth daily before breakfast., Disp: , Rfl:    lidocaine -prilocaine  (EMLA ) cream, APPLY 1 APPLICATION TOPICALLY AS NEEDED., Disp: 30 g, Rfl: 1   magnesium  oxide (MAG-OX) 400 MG tablet, Take 800 mg by mouth 2 (two) times daily., Disp: , Rfl:    melatonin 3 MG TABS tablet, Take 3 mg by mouth at bedtime as needed. (Patient not taking: Reported on 09/05/2024), Disp: , Rfl:    naproxen  sodium (ANAPROX  DS) 550 MG tablet, Take 1 tablet (550 mg total) by mouth 2 (two) times daily as needed for moderate pain (pain score 4-6). Take with meals, Disp: 30 tablet, Rfl: 1   Nebulizers (COMPRESSOR/NEBULIZER) MISC, Use as directed for nebulized medications., Disp:  1 each, Rfl: 0   nicotine  (NICODERM CQ  - DOSED IN MG/24 HOURS) 21 mg/24hr patch, Place 21 mg onto the skin daily., Disp: , Rfl:    nitroGLYCERIN  (NITROSTAT ) 0.4 MG SL tablet, Place under the tongue., Disp: , Rfl:    OXYGEN, Inhale 2 L into the lungs at bedtime., Disp: , Rfl:    pantoprazole  (PROTONIX ) 40 MG tablet, Take 1 tablet (40 mg total) by mouth 2 (two) times daily before meals, Disp: , Rfl:    promethazine -dextromethorphan  (PROMETHAZINE -DM) 6.25-15 MG/5ML syrup, Take 5 mLs by mouth 4 (four) times daily as needed for cough., Disp: 180 mL, Rfl: 1   sodium chloride  1 g tablet, Take 5 tablets (5 g total)  by mouth 3 (three) times daily with meals., Disp: , Rfl:    tadalafil  (CIALIS ) 5 MG tablet, Take 1 tablet (5 mg total) by mouth daily as needed for erectile dysfunction., Disp: 90 tablet, Rfl: 3   traMADol  (ULTRAM ) 50 MG tablet, Take 50 mg by mouth every 6 (six) hours as needed. (Patient not taking: Reported on 09/05/2024), Disp: , Rfl:    Vitamin D, Ergocalciferol, (DRISDOL) 1.25 MG (50000 UNIT) CAPS capsule, Take 50,000 Units by mouth once a week., Disp: , Rfl:    YUPELRI  175 MCG/3ML nebulizer solution, TAKE 3 MLS (175 MCG TOTAL) BY NEBULIZATION DAILY. DX: J44.9, Disp: 90 mL, Rfl: 11 No current facility-administered medications for this visit.  Facility-Administered Medications Ordered in Other Visits:    heparin  lock flush 100 unit/mL, 500 Units, Intravenous, Once, [name], Evalene PARAS, MD  Past Medical History: Past Medical History:  Diagnosis Date   Anginal pain    Anxiety    Asthma    Chest pain    CHF (congestive heart failure) (HCC)    Chicken pox    Complication of anesthesia    o2 dropped after neck fusion   COPD (chronic obstructive pulmonary disease) (HCC)    Coronary artery disease    Cough    chronic  clear phlegm   Dysrhythmia    palpitations   GERD (gastroesophageal reflux disease)    h/o reflux/ hoarsness   Hematochezia    Hemorrhoids    History of chickenpox    History of colon polyps    History of Helicobacter pylori infection    Hoarseness    Hypertension    Lung cancer (HCC) 05/2016   Chemo + rad tx's.    Migraines    OSA (obstructive sleep apnea)    has CPAP but does not use   Personal history of tobacco use, presenting hazards to health 03/05/2016   Pneumonia    5/17   Raynaud disease    Raynaud disease    Raynaud's disease    Rotator cuff tear    on right   Shortness of breath dyspnea    Sleep apnea    Ulcer (traumatic) of oral mucosa     Tobacco Use: Social History   Tobacco Use  Smoking Status Every Day   Current packs/day: 1.50    Average packs/day: 3.0 packs/day for 52.9 years (156.8 ttl pk-yrs)   Types: Cigarettes   Start date: 1973   Passive exposure: Current  Smokeless Tobacco Never    Labs: Review Flowsheet        No data to display           Pulmonary Assessment Scores:  Pulmonary Assessment Scores     Row Name 05/30/24 1525 06/11/24 1717       ADL  UCSD   ADL Phase -- Entry    SOB Score total -- 89    Rest -- 3    Walk -- 3    Stairs -- 5    Bath -- 4    Dress -- 2    Shop -- 5      CAT Score   CAT Score 34 --      mMRC Score   mMRC Score 3 --       UCSD: Self-administered rating of dyspnea associated with activities of daily living (ADLs) 6-point scale (0 = not at all to 5 = maximal or unable to do because of breathlessness)  Scoring Scores range from 0 to 120.  Minimally important difference is 5 units  CAT: CAT can identify the health impairment of COPD patients and is better correlated with disease progression.  CAT has a scoring range of zero to 40. The CAT score is classified into four groups of low (less than 10), medium (10 - 20), high (21-30) and very high (31-40) based on the impact level of disease on health status. A CAT score over 10 suggests significant symptoms.  A worsening CAT score could be explained by an exacerbation, poor medication adherence, poor inhaler technique, or progression of COPD or comorbid conditions.  CAT MCID is 2 points  mMRC: mMRC (Modified Medical Research Council) Dyspnea Scale is used to assess the degree of baseline functional disability in patients of respiratory disease due to dyspnea. No minimal important difference is established. A decrease in score of 1 point or greater is considered a positive change.   Pulmonary Function Assessment:   Exercise Target Goals: Exercise Program Goal: Individual exercise prescription set using results from initial 6 min walk test and THRR while considering  patient's activity barriers and safety.    Exercise Prescription Goal: Initial exercise prescription builds to 30-45 minutes a day of aerobic activity, 2-3 days per week.  Home exercise guidelines will be given to patient during program as part of exercise prescription that the participant will acknowledge.  Education: Aerobic Exercise: - Group verbal and visual presentation on the components of exercise prescription. Introduces F.I.T.T principle from ACSM for exercise prescriptions.  Reviews F.I.T.T. principles of aerobic exercise including progression. Written material provided at class time.   Education: Resistance Exercise: - Group verbal and visual presentation on the components of exercise prescription. Introduces F.I.T.T principle from ACSM for exercise prescriptions  Reviews F.I.T.T. principles of resistance exercise including progression. Written material provided at class time.    Education: Exercise & Equipment Safety: - Individual verbal instruction and demonstration of equipment use and safety with use of the equipment. Flowsheet Row Pulmonary Rehab from 05/30/2024 in [facility] Cardiac and Pulmonary Rehab  Date 05/30/24  Educator [facility]  Instruction Review Code 1- Verbalizes Understanding    Education: Exercise Physiology & General Exercise Guidelines: - Group verbal and written instruction with models to review the exercise physiology of the cardiovascular system and associated critical values. Provides general exercise guidelines with specific guidelines to those with heart or lung disease.    Education: Flexibility, Balance, Mind/Body Relaxation: - Group verbal and visual presentation with interactive activity on the components of exercise prescription. Introduces F.I.T.T principle from ACSM for exercise prescriptions. Reviews F.I.T.T. principles of flexibility and balance exercise training including progression. Also discusses the mind body connection.  Reviews various relaxation techniques to help reduce and manage stress  (i.e. Deep breathing, progressive muscle relaxation, and visualization). Balance handout provided to take home. Written  material provided at class time.   Activity Barriers & Risk Stratification:  Activity Barriers & Cardiac Risk Stratification - 05/30/24 1519       Activity Barriers & Cardiac Risk Stratification   Activity Barriers Deconditioning;Muscular Weakness;Shortness of Breath;Other (comment)    Comments Shoulder mobility concerns          6 Minute Walk:  6 Minute Walk     Row Name 05/30/24 1516         6 Minute Walk   Phase Initial     Distance 670 feet     Walk Time 6 minutes     # of Rest Breaks 0     MPH 1.27     METS 2.43     RPE 13     Perceived Dyspnea  3     VO2 Peak 8.52     Symptoms Yes (comment)     Comments dizziness, SOB     Resting HR 88 bpm     Resting BP 114/62     Resting Oxygen Saturation  93 %     Exercise Oxygen Saturation  during 6 min walk 89 %     Max Ex. HR 99 bpm     Max Ex. BP 134/64     2 Minute Post BP 126/68       Interval HR   1 Minute HR 99     2 Minute HR 94     3 Minute HR 91     4 Minute HR 95     5 Minute HR 97     6 Minute HR 99     2 Minute Post HR 87     Interval Heart Rate? Yes       Interval Oxygen   Interval Oxygen? Yes     Baseline Oxygen Saturation % 93 %     1 Minute Oxygen Saturation % 90 %     1 Minute Liters of Oxygen 0 L     2 Minute Oxygen Saturation % 93 %     2 Minute Liters of Oxygen 0 L     3 Minute Oxygen Saturation % 88 %     3 Minute Liters of Oxygen 0 L     4 Minute Oxygen Saturation % 89 %     4 Minute Liters of Oxygen 0 L     5 Minute Oxygen Saturation % 90 %     5 Minute Liters of Oxygen 0 L     6 Minute Oxygen Saturation % 93 %     6 Minute Liters of Oxygen 0 L     2 Minute Post Oxygen Saturation % 95 %     2 Minute Post Liters of Oxygen 0 L       Oxygen Initial Assessment:  Oxygen Initial Assessment - 05/30/24 1524       Home Oxygen   Sleep Oxygen Prescription Continuous     Liters per minute 3    Home Exercise Oxygen Prescription Continuous    Liters per minute 3    Home Resting Oxygen Prescription None    Compliance with Home Oxygen Use Yes      Initial 6 min Walk   Oxygen Used None      Program Oxygen Prescription   Program Oxygen Prescription None      Intervention   Short Term Goals To learn and demonstrate proper pursed lip breathing techniques or other breathing techniques.  Long  Term Goals Exhibits proper breathing techniques, such as pursed lip breathing or other method taught during program session          Oxygen Re-Evaluation:  Oxygen Re-Evaluation     Row Name 06/11/24 1413 07/19/24 1552 08/30/24 1607         Program Oxygen Prescription   Program Oxygen Prescription -- None None       Home Oxygen   Home Oxygen Device -- Home Concentrator Home Concentrator     Sleep Oxygen Prescription -- Continuous Continuous     Liters per minute -- 2 2     Home Exercise Oxygen Prescription -- None None     Liters per minute -- --  prn --  prn     Home Resting Oxygen Prescription -- None None     Compliance with Home Oxygen Use -- Yes Yes       Goals/Expected Outcomes   Short Term Goals -- To learn and demonstrate proper pursed lip breathing techniques or other breathing techniques.  To learn and demonstrate proper use of respiratory medications     Long  Term Goals -- Exhibits proper breathing techniques, such as pursed lip breathing or other method taught during program session Compliance with respiratory medication     Comments Reviewed PLB technique with pt.  Talked about how it works and it's importance in maintaining their exercise saturations. Informed patient how to perform the Pursed Lipped breathing technique. Told patient to Inhale through the nose and out the mouth with pursed lips to keep their airways open, help oxygenate them better, practice when at rest or doing strenuous activity. Patient Verbalizes understanding of  technique and will work on and be reiterated during [facility]. Paul Carpenter takes his breathing treatments as needed. He uses oxygen at night and monitors his oxygen level. He knows to keep his oxygen above 88 percent.     Goals/Expected Outcomes Short: Become more profiecient at using PLB.   Long: Become independent at using PLB. Short: use PLB with exertion. Long: use PLB on exertion proficiently and independently. Short: continue to exercise to improve lung function. Long: maintain exercise post [facility].        Oxygen Discharge (Final Oxygen Re-Evaluation):  Oxygen Re-Evaluation - 08/30/24 1607       Program Oxygen Prescription   Program Oxygen Prescription None      Home Oxygen   Home Oxygen Device Home Concentrator    Sleep Oxygen Prescription Continuous    Liters per minute 2    Home Exercise Oxygen Prescription None    Liters per minute --   prn   Home Resting Oxygen Prescription None    Compliance with Home Oxygen Use Yes      Goals/Expected Outcomes   Short Term Goals To learn and demonstrate proper use of respiratory medications    Long  Term Goals Compliance with respiratory medication    Comments Paul Carpenter takes his breathing treatments as needed. He uses oxygen at night and monitors his oxygen level. He knows to keep his oxygen above 88 percent.    Goals/Expected Outcomes Short: continue to exercise to improve lung function. Long: maintain exercise post [facility].          Initial Exercise Prescription:  Initial Exercise Prescription - 05/30/24 1500       Date of Initial Exercise RX and Referring Provider   Date 05/30/24    Referring Provider Dr. Dedra Sanders      Oxygen   Oxygen Continuous  Liters 0-2L    Maintain Oxygen Saturation 88% or higher      Recumbant Bike   Level 1    RPM 50    Watts 25    Minutes 15    METs 2.43      NuStep   Level 1    SPM 80    Minutes 15    METs 2.43      REL-XR   Level 1    Watts 25    Speed 50    Minutes 15    METs  2.43      Track   Laps 18    Minutes 15    METs 1.98      Intensity   THRR 40-80% of Max Heartrate 114-140    Ratings of Perceived Exertion 11-13    Perceived Dyspnea 0-4      Resistance Training   Training Prescription Yes    Weight 3lb    Reps 10-15          Perform Capillary Blood Glucose checks as needed.  Exercise Prescription Changes:   Exercise Prescription Changes     Row Name 05/30/24 1500 06/20/24 1100 07/04/24 1500 07/19/24 0800 08/02/24 1600     Response to Exercise   Blood Pressure (Admit) 114/62 134/70 122/60 128/76 110/62   Blood Pressure (Exercise) 134/64 154/82 142/82 122/62 142/60   Blood Pressure (Exit) 126/68 116/64 136/78 126/62 128/72   Heart Rate (Admit) 88 bpm 92 bpm 92 bpm 103 bpm 100 bpm   Heart Rate (Exercise) 99 bpm 101 bpm 113 bpm 90 bpm 106 bpm   Heart Rate (Exit) 81 bpm 88 bpm 98 bpm 85 bpm 93 bpm   Oxygen Saturation (Admit) 93 % 94 % 93 % 96 % 96 %   Oxygen Saturation (Exercise) 89 % 94 % 89 % 93 % 93 %   Oxygen Saturation (Exit) 95 % 96 % 94 % 96 % 95 %   Rating of Perceived Exertion (Exercise) 13 13 15 11 13    Perceived Dyspnea (Exercise) 3 3 3 3 3    Symptoms Dizziness, SOB none none none none   Comments results first 2 weeks of exercise -- -- --   Duration -- Progress to 30 minutes of  aerobic without signs/symptoms of physical distress Progress to 30 minutes of  aerobic without signs/symptoms of physical distress Progress to 30 minutes of  aerobic without signs/symptoms of physical distress Progress to 30 minutes of  aerobic without signs/symptoms of physical distress   Intensity -- THRR unchanged THRR unchanged THRR unchanged THRR unchanged     Progression   Progression -- Continue to progress workloads to maintain intensity without signs/symptoms of physical distress. Continue to progress workloads to maintain intensity without signs/symptoms of physical distress. Continue to progress workloads to maintain intensity without  signs/symptoms of physical distress. Continue to progress workloads to maintain intensity without signs/symptoms of physical distress.   Average METs -- 1.37 1.72 1.2 1.21     Resistance Training   Training Prescription -- Yes Yes Yes Yes   Weight -- 3lb 3lb 3lb 3lb   Reps -- 10-15 10-15 10-15 10-15     Interval Training   Interval Training -- No No No No     Oxygen   Oxygen -- Continuous -- -- --   Liters -- 0 -- -- --     Treadmill   MPH -- -- -- -- 0.7   Grade -- -- -- -- 0  Minutes -- -- -- -- 15   METs -- -- -- -- 1.54     Recumbant Bike   Level -- -- 1 -- --   Watts -- -- 25 -- --   Minutes -- -- 15 -- --   METs -- -- 3.09 -- --     NuStep   Level -- 2 3 1 2    Minutes -- 15 15 30 30    METs -- 1.5 1.1 1.2 1.3     REL-XR   Level -- -- 1 -- 1   Minutes -- -- 15 -- 15   METs -- -- -- -- 1     Biostep-RELP   Level -- 1 -- -- --   Minutes -- 15 -- -- --   METs -- 1 -- -- --     Track   Laps -- 9 -- -- --   Minutes -- 15 -- -- --   METs -- 1.49 -- -- --     Oxygen   Maintain Oxygen Saturation -- 88% or higher 88% or higher 88% or higher 88% or higher    Row Name 08/15/24 1100 08/27/24 1100 09/12/24 1100         Response to Exercise   Blood Pressure (Admit) 162/84 124/72 140/72     Blood Pressure (Exit) 132/72 140/76 128/72     Heart Rate (Admit) 94 bpm 100 bpm 95 bpm     Heart Rate (Exercise) 100 bpm 102 bpm 99 bpm     Heart Rate (Exit) 88 bpm 94 bpm 92 bpm     Oxygen Saturation (Admit) 95 % 95 % 93 %     Oxygen Saturation (Exercise) 94 % 94 % 89 %     Oxygen Saturation (Exit) 98 % 94 % 96 %     Rating of Perceived Exertion (Exercise) 13 14 15      Perceived Dyspnea (Exercise) 3 -- 3     Symptoms none none none     Duration Progress to 30 minutes of  aerobic without signs/symptoms of physical distress Progress to 30 minutes of  aerobic without signs/symptoms of physical distress Continue with 30 min of aerobic exercise without signs/symptoms of  physical distress.     Intensity THRR unchanged THRR unchanged THRR unchanged       Progression   Progression Continue to progress workloads to maintain intensity without signs/symptoms of physical distress. Continue to progress workloads to maintain intensity without signs/symptoms of physical distress. Continue to progress workloads to maintain intensity without signs/symptoms of physical distress.     Average METs 1.45 1.4 1.4       Resistance Training   Training Prescription Yes Yes Yes     Weight 3 lb 3 lb 3 lb     Reps 10-15 10-15 10-15       Interval Training   Interval Training No No No       NuStep   Level 2 3 2      Minutes 30 30 30      METs 1.7 -- 1.5       T5 Nustep   Level -- 1 --     Minutes -- 15 --     METs -- 1.4 --       Oxygen   Maintain Oxygen Saturation 88% or higher 88% or higher 88% or higher        Exercise Comments:   Exercise Comments     Row Name 06/11/24 1404  Exercise Comments First full day of exercise!  Patient was oriented to gym and equipment including functions, settings, policies, and procedures.  Patient's individual exercise prescription and treatment plan were reviewed.  All starting workloads were established based on the results of the 6 minute walk test done at initial orientation visit.  The plan for exercise progression was also introduced and progression will be customized based on patient's performance and goals.          Exercise Goals and Review:   Exercise Goals     Row Name 05/30/24 1523             Exercise Goals   Increase Physical Activity Yes       Intervention Provide advice, education, support and counseling about physical activity/exercise needs.;Develop an individualized exercise prescription for aerobic and resistive training based on initial evaluation findings, risk stratification, comorbidities and participant's personal goals.       Expected Outcomes Short Term: Attend rehab on a regular  basis to increase amount of physical activity.;Long Term: Exercising regularly at least 3-5 days a week.;Long Term: Add in home exercise to make exercise part of routine and to increase amount of physical activity.       Increase Strength and Stamina Yes       Intervention Provide advice, education, support and counseling about physical activity/exercise needs.;Develop an individualized exercise prescription for aerobic and resistive training based on initial evaluation findings, risk stratification, comorbidities and participant's personal goals.       Expected Outcomes Short Term: Increase workloads from initial exercise prescription for resistance, speed, and METs.;Short Term: Perform resistance training exercises routinely during rehab and add in resistance training at home;Long Term: Improve cardiorespiratory fitness, muscular endurance and strength as measured by increased METs and functional capacity ( )       Able to understand and use rate of perceived exertion (RPE) scale Yes       Intervention Provide education and explanation on how to use RPE scale       Expected Outcomes Short Term: Able to use RPE daily in rehab to express subjective intensity level;Long Term:  Able to use RPE to guide intensity level when exercising independently       Able to understand and use Dyspnea scale Yes       Intervention Provide education and explanation on how to use Dyspnea scale       Expected Outcomes Short Term: Able to use Dyspnea scale daily in rehab to express subjective sense of shortness of breath during exertion;Long Term: Able to use Dyspnea scale to guide intensity level when exercising independently       Knowledge and understanding of Target Heart Rate Range (THRR) Yes       Intervention Provide education and explanation of THRR including how the numbers were predicted and where they are located for reference       Expected Outcomes Short Term: Able to state/look up THRR;Short Term: Able to  use daily as guideline for intensity in rehab;Long Term: Able to use THRR to govern intensity when exercising independently       Able to check pulse independently Yes       Intervention Provide education and demonstration on how to check pulse in carotid and radial arteries.;Review the importance of being able to check your own pulse for safety during independent exercise       Expected Outcomes Long Term: Able to check pulse independently and accurately;Short Term: Able to explain why pulse  checking is important during independent exercise       Understanding of Exercise Prescription Yes       Intervention Provide education, explanation, and written materials on patient's individual exercise prescription       Expected Outcomes Short Term: Able to explain program exercise prescription;Long Term: Able to explain home exercise prescription to exercise independently          Exercise Goals Re-Evaluation :  Exercise Goals Re-Evaluation     Row Name 06/11/24 1405 06/11/24 1407 06/20/24 1136 07/04/24 1519 07/19/24 0825     Exercise Goal Re-Evaluation   Exercise Goals Review Increase Physical Activity;Able to understand and use rate of perceived exertion (RPE) scale;Knowledge and understanding of Target Heart Rate Range (THRR);Understanding of Exercise Prescription;Increase Strength and Stamina;Able to understand and use Dyspnea scale;Able to check pulse independently Increase Physical Activity;Able to understand and use rate of perceived exertion (RPE) scale;Knowledge and understanding of Target Heart Rate Range (THRR);Understanding of Exercise Prescription;Increase Strength and Stamina;Able to understand and use Dyspnea scale;Able to check pulse independently Increase Physical Activity;Increase Strength and Stamina;Understanding of Exercise Prescription Increase Physical Activity;Increase Strength and Stamina;Understanding of Exercise Prescription Increase Physical Activity;Increase Strength and  Stamina;Understanding of Exercise Prescription   Comments Reviewed RPE and dyspnea scale, THR and program prescription with pt today.  Pt voiced understanding and was given a copy of goals to take home. -- Paul Carpenter is off to a good start in the program. He has been able to attend his first 2 sessions during this review period. During his sessions he was able to walk 9 laps on the track, and use the T4 nustep at level 2. We will continue to monitor his progress in the program. Paul Carpenter is off to a good start in the program. He increased to level 3 on the T4 nustep. He worked at level 1 on the recumbent bike and XR. We will continue to monitor his progress in the program. Paul Carpenter is is doing well in rehab and was only able to attend one session in this review. We will encourage him to be more consistent with rehab for exercise benefits. He decreased down to level 1 on the nustep in this session for 30 minutes. We will continue to monitor his progress in the program.   Expected Outcomes Short: Use RPE daily to regulate intensity.  Long: Follow program prescription in THR. -- Short: Continue to follow exercise prescription. Long: Continue exercise to improve strength and stamina. Short: Continue to progressively increase T4 nustep workload. Long: Continue exercise to improve strength and stamina. Short: Be more consistent with rehab. Long: Continue exercise to improve strength and stamina.    Row Name 08/02/24 1656 08/15/24 1133 08/27/24 1147 09/12/24 1103       Exercise Goal Re-Evaluation   Exercise Goals Review Increase Physical Activity;Increase Strength and Stamina;Understanding of Exercise Prescription Increase Physical Activity;Increase Strength and Stamina;Understanding of Exercise Prescription Increase Physical Activity;Increase Strength and Stamina;Understanding of Exercise Prescription Increase Physical Activity;Increase Strength and Stamina;Understanding of Exercise Prescription    Comments Paul Carpenter is is doing well in  rehab. He increased to level 2 on the T4 nustep. He maintained level 1 on the XR. He tried the treadmill and was able to do a speed of 0.62mph and no incline. We will continue to monitor his progress in the program. Paul Carpenter is is doing well in rehab. He has only worked on the T4 nustep since the last review. He continues to do well with 3 lb handweights for resistance training.  We will continue to monitor his progress in the program. Paul Carpenter continues to do well in rehab. He was only able to attend 2 sessions during this review period, but was able to increase from level 2 to 3 on the T4 nustep. We will continue to monitor his progress in the program. Paul Carpenter has only attended two sessions since the last review. He continues to work on the T4 nustep at level 2 for 30 minutes. We will continue to monitor his progress in the program.    Expected Outcomes Short: Continue to progressively increase XR and nustep workloads. Long: Continue exercise to improve strength and stamina. Short: Use other aerobic machines for exercise. Long: Continue exercise to improve strength and stamina. Short: Attend rehab more frequently. Long: Continue exercise to improve strength and stamina. Short: Attend rehab more consistently. Long: Continue exercise to improve strength and stamina.       Discharge Exercise Prescription (Final Exercise Prescription Changes):  Exercise Prescription Changes - 09/12/24 1100       Response to Exercise   Blood Pressure (Admit) 140/72    Blood Pressure (Exit) 128/72    Heart Rate (Admit) 95 bpm    Heart Rate (Exercise) 99 bpm    Heart Rate (Exit) 92 bpm    Oxygen Saturation (Admit) 93 %    Oxygen Saturation (Exercise) 89 %    Oxygen Saturation (Exit) 96 %    Rating of Perceived Exertion (Exercise) 15    Perceived Dyspnea (Exercise) 3    Symptoms none    Duration Continue with 30 min of aerobic exercise without signs/symptoms of physical distress.    Intensity THRR unchanged      Progression    Progression Continue to progress workloads to maintain intensity without signs/symptoms of physical distress.    Average METs 1.4      Resistance Training   Training Prescription Yes    Weight 3 lb    Reps 10-15      Interval Training   Interval Training No      NuStep   Level 2    Minutes 30    METs 1.5      Oxygen   Maintain Oxygen Saturation 88% or higher          Nutrition:  Target Goals: Understanding of nutrition guidelines, daily intake of sodium 1500mg , cholesterol 200mg , calories 30% from fat and 7% or less from saturated fats, daily to have 5 or more servings of fruits and vegetables.  Education: Nutrition 1 -Group instruction provided by verbal, written material, interactive activities, discussions, models, and posters to present general guidelines for heart healthy nutrition including macronutrients, label reading, and promoting whole foods over processed counterparts. Education serves as pensions consultant of discussion of heart healthy eating for all. Written material provided at class time.     Education: Nutrition 2 -Group instruction provided by verbal, written material, interactive activities, discussions, models, and posters to present general guidelines for heart healthy nutrition including sodium, cholesterol, and saturated fat. Providing guidance of habit forming to improve blood pressure, cholesterol, and body weight. Written material provided at class time.     Biometrics:  Pre Biometrics - 05/30/24 1524       Pre Biometrics   Height 5' 11 (1.803 m)    Weight 159 lb 14.4 oz (72.5 kg)    Waist Circumference 34 inches    Hip Circumference 36.5 inches    Waist to Hip Ratio 0.93 %    BMI (Calculated) 22.31  Single Leg Stand 0 seconds           Nutrition Therapy Plan and Nutrition Goals:   Nutrition Assessments:  MEDIFICTS Score Key: >=70 Need to make dietary changes  40-70 Heart Healthy Diet <= 40 Therapeutic Level Cholesterol  Diet  Flowsheet Row Pulmonary Rehab from 01/18/2024 in [facility] Cardiac and Pulmonary Re[facility]  Picture Your Plate Total Score on Admission 39   Picture Your Plate Scores: <59 Unhealthy dietary pattern with much room for improvement. 41-50 Dietary pattern unlikely to meet recommendations for good health and room for improvement. 51-60 More healthful dietary pattern, with some room for improvement.  >60 Healthy dietary pattern, although there may be some specific behaviors that could be improved.   Nutrition Goals Re-Evaluation:  Nutrition Goals Re-Evaluation     Row Name 07/19/24 1555 08/30/24 1607           Goals   Comment Patient was informed on why it is important to maintain a balanced diet when dealing with Respiratory issues. Explained that it takes a lot of energy to breath and when they are short of breath often they will need to have a good diet to help keep up with the calories they are expending for breathing. RD appointment deferred.      Expected Outcome Short: Choose and plan snacks accordingly to patients caloric intake to improve breathing. Long: Maintain a diet independently that meets their caloric intake to aid in daily shortness of breath. --         Nutrition Goals Discharge (Final Nutrition Goals Re-Evaluation):  Nutrition Goals Re-Evaluation - 08/30/24 1607       Goals   Comment RD appointment deferred.          Psychosocial: Target Goals: Acknowledge presence or absence of significant depression and/or stress, maximize coping skills, provide positive support system. Participant is able to verbalize types and ability to use techniques and skills needed for reducing stress and depression.   Education: Stress, Anxiety, and Depression - Group verbal and visual presentation to define topics covered.  Reviews how body is impacted by stress, anxiety, and depression.  Also discusses healthy ways to reduce stress and to treat/manage anxiety and depression.  Written  material provided at class time.   Education: Sleep Hygiene -Provides group verbal and written instruction about how sleep can affect your health.  Define sleep hygiene, discuss sleep cycles and impact of sleep habits. Review good sleep hygiene tips.    Initial Review & Psychosocial Screening:   Quality of Life Scores:  Scores of 19 and below usually indicate a poorer quality of life in these areas.  A difference of  2-3 points is a clinically meaningful difference.  A difference of 2-3 points in the total score of the Quality of Life Index has been associated with significant improvement in overall quality of life, self-image, physical symptoms, and general health in studies assessing change in quality of life.  PHQ-9: Review Flowsheet  More data exists      08/30/2024 07/19/2024 05/30/2024 01/19/2024 03/10/2023  Depression screen PHQ 2/9  Decreased Interest 1 3 2 2  0  Down, Depressed, Hopeless 2 2 2  0 1  PHQ - 2 Score 3 5 4 2 1   Altered sleeping 3 3 3 2  -  Tired, decreased energy 3 3 3 3  -  Change in appetite 0 3 3 2  -  Feeling bad or failure about yourself  1 2 2 2  -  Trouble concentrating 0 1 1 1  -  Moving slowly or fidgety/restless 0 1 1 3  -  Suicidal thoughts 0 0 0 0 -  PHQ-9 Score 10  18  17  15   -  Difficult doing work/chores Very difficult Very difficult Very difficult Extremely dIfficult -    Details       Data saved with a previous flowsheet row definition        Interpretation of Total Score  Total Score Depression Severity:  1-4 = Minimal depression, 5-9 = Mild depression, 10-14 = Moderate depression, 15-19 = Moderately severe depression, 20-27 = Severe depression   Psychosocial Evaluation and Intervention:   Psychosocial Re-Evaluation:  Psychosocial Re-Evaluation     Row Name 07/19/24 1600 08/30/24 1605           Psychosocial Re-Evaluation   Current issues with Current Stress Concerns Current Stress Concerns      Comments Reviewed patient health  questionnaire (PHQ-9) with patient for follow up. Previously, patients score indicated signs/symptoms of depression.  Reviewed to see if patient is improving symptom wise while in program.  Score declined and patient states that it is because he has been getting worse with his lung issues. He is not able to do the things that he used to do and finds himself short of breath alot. Reviewed patient health questionnaire (PHQ-9) with patient for follow up. Previously, patients score indicated signs/symptoms of depression.  Reviewed to see if patient is improving symptom wise while in program.  Score improved and patient states that it is because he has been able to exercise some.      Expected Outcomes Short: Continue to work toward an improvement in PHQ9 scores by attending [facility] regularly. Long: Continue to improve stress and depression coping skills by talking with staff and attending [facility] regularly and work toward a positive mental state. Short: Continue to attend [facility] regularly for regular exercise and social engagement. Long: Continue to improve symptoms and manage a positive mental state.      Interventions Encouraged to attend Pulmonary Rehabilitation for the exercise Encouraged to attend Pulmonary Rehabilitation for the exercise      Continue Psychosocial Services  Follow up required by staff Follow up required by staff         Psychosocial Discharge (Final Psychosocial Re-Evaluation):  Psychosocial Re-Evaluation - 08/30/24 1605       Psychosocial Re-Evaluation   Current issues with Current Stress Concerns    Comments Reviewed patient health questionnaire (PHQ-9) with patient for follow up. Previously, patients score indicated signs/symptoms of depression.  Reviewed to see if patient is improving symptom wise while in program.  Score improved and patient states that it is because he has been able to exercise some.    Expected Outcomes Short: Continue to attend  [facility] regularly for regular exercise and social engagement. Long: Continue to improve symptoms and manage a positive mental state.    Interventions Encouraged to attend Pulmonary Rehabilitation for the exercise    Continue Psychosocial Services  Follow up required by staff          Education: Education Goals: Education classes will be provided on a weekly basis, covering required topics. Participant will state understanding/return demonstration of topics presented.  Learning Barriers/Preferences:   General Pulmonary Education Topics:  Infection Prevention: - Provides verbal and written material to individual with discussion of infection control including proper hand washing and proper equipment cleaning during exercise session. Flowsheet Row Pulmonary Rehab from 05/30/2024 in [facility] Cardiac and Pulmonary Rehab  Date 05/30/24  Educator  [facility]  Instruction Review Code 1- Verbalizes Understanding    Falls Prevention: - Provides verbal and written material to individual with discussion of falls prevention and safety. Flowsheet Row Pulmonary Rehab from 05/30/2024 in [facility] Cardiac and Pulmonary Rehab  Date 05/30/24  Educator [facility]  Instruction Review Code 1- Verbalizes Understanding    Chronic Lung Disease Review: - Group verbal instruction with posters, models, PowerPoint presentations and videos,  to review new updates, new respiratory medications, new advancements in procedures and treatments. Providing information on websites and 800 numbers for continued self-education. Includes information about supplement oxygen, available portable oxygen systems, continuous and intermittent flow rates, oxygen safety, concentrators, and Medicare reimbursement for oxygen. Explanation of Pulmonary Drugs, including class, frequency, complications, importance of spacers, rinsing mouth after steroid MDI's, and proper cleaning methods for nebulizers. Review of basic lung anatomy and physiology  related to function, structure, and complications of lung disease. Review of risk factors. Discussion about methods for diagnosing sleep apnea and types of masks and machines for OSA. Includes a review of the use of types of environmental controls: home humidity, furnaces, filters, dust mite/pet prevention, HEPA vacuums. Discussion about weather changes, air quality and the benefits of nasal washing. Instruction on Warning signs, infection symptoms, calling MD promptly, preventive modes, and value of vaccinations. Review of effective airway clearance, coughing and/or vibration techniques. Emphasizing that all should Create an Action Plan. Written material provided at class time. Flowsheet Row Pulmonary Rehab from 01/26/2024 in [facility] Cardiac and Pulmonary Rehab  Education need identified 01/26/24    AED/CPR: - Group verbal and written instruction with the use of models to demonstrate the basic use of the AED with the basic ABC's of resuscitation.    Tests and Procedures:  - Group verbal and visual presentation and models provide information about basic cardiac anatomy and function. Reviews the testing methods done to diagnose heart disease and the outcomes of the test results. Describes the treatment choices: Medical Management, Angioplasty, or Coronary Bypass Surgery for treating various heart conditions including Myocardial Infarction, Angina, Valve Disease, and Cardiac Arrhythmias.  Written material provided at class time.   Medication Safety: - Group verbal and visual instruction to review commonly prescribed medications for heart and lung disease. Reviews the medication, class of the drug, and side effects. Includes the steps to properly store meds and maintain the prescription regimen.  Written material given at graduation.   Other: -Provides group and verbal instruction on various topics (see comments)   Knowledge Questionnaire Score:  Knowledge Questionnaire Score - 06/19/24 0842        Knowledge Questionnaire Score   Pre Score 14/18           Core Components/Risk Factors/Patient Goals at Admission:  Personal Goals and Risk Factors at Admission - 05/30/24 1526       Core Components/Risk Factors/Patient Goals on Admission   Tobacco Cessation Yes    Number of packs per day <2 ppd    Intervention Assist the participant in steps to quit. Provide individualized education and counseling about committing to Tobacco Cessation, relapse prevention, and pharmacological support that can be provided by physician.;Education officer, environmental, assist with locating and accessing local/national Quit Smoking programs, and support quit date choice.    Expected Outcomes Short Term: Will demonstrate readiness to quit, by selecting a quit date.;Short Term: Will quit all tobacco product use, adhering to prevention of relapse plan.;Long Term: Complete abstinence from all tobacco products for at least 12 months from quit date.  Improve shortness of breath with ADL's Yes    Intervention Provide education, individualized exercise plan and daily activity instruction to help decrease symptoms of SOB with activities of daily living.    Expected Outcomes Short Term: Improve cardiorespiratory fitness to achieve a reduction of symptoms when performing ADLs;Long Term: Be able to perform more ADLs without symptoms or delay the onset of symptoms    Hypertension Yes    Intervention Provide education on lifestyle modifcations including regular physical activity/exercise, weight management, moderate sodium restriction and increased consumption of fresh fruit, vegetables, and low fat dairy, alcohol moderation, and smoking cessation.;Monitor prescription use compliance.    Expected Outcomes Short Term: Continued assessment and intervention until BP is < 140/66mm HG in hypertensive participants. < 130/31mm HG in hypertensive participants with diabetes, heart failure or chronic kidney disease.;Long Term: Maintenance  of blood pressure at goal levels.    Lipids Yes    Intervention Provide education and support for participant on nutrition & aerobic/resistive exercise along with prescribed medications to achieve LDL 70mg , HDL >40mg .    Expected Outcomes Short Term: Participant states understanding of desired cholesterol values and is compliant with medications prescribed. Participant is following exercise prescription and nutrition guidelines.;Long Term: Cholesterol controlled with medications as prescribed, with individualized exercise RX and with personalized nutrition plan. Value goals: LDL < 70mg , HDL > 40 mg.          Education:Diabetes - Individual verbal and written instruction to review signs/symptoms of diabetes, desired ranges of glucose level fasting, after meals and with exercise. Acknowledge that pre and post exercise glucose checks will be done for 3 sessions at entry of program.   Know Your Numbers and Heart Failure: - Group verbal and visual instruction to discuss disease risk factors for cardiac and pulmonary disease and treatment options.  Reviews associated critical values for Overweight/Obesity, Hypertension, Cholesterol, and Diabetes.  Discusses basics of heart failure: signs/symptoms and treatments.  Introduces Heart Failure Zone chart for action plan for heart failure. Written material provided at class time.   Core Components/Risk Factors/Patient Goals Review:   Goals and Risk Factor Review     Row Name 07/19/24 1554 08/30/24 1600           Core Components/Risk Factors/Patient Goals Review   Personal Goals Review Improve shortness of breath with ADL's Other;Tobacco Cessation      Review Spoke to patient about their shortness of breath and what they can do to improve. Patient has been informed of breathing techniques when starting the program. Patient is informed to tell staff if they have had any med changes and that certain meds they are taking or not taking can be causing  shortness of breath. Patient states he does not want to do anymore treatments for his lungs such as radiation. He speaks with his doctor about his CT results soon. Informed him that exercising will benifit his overall health and to continue if he wants to. He has not quit smoking and does not plan to quit looking at his past notes.      Expected Outcomes Short: Attend [facility] regularly to improve shortness of breath with ADL's. Long: maintain independence with ADL's Short: continue to exercise. Long: maintain exercise independently.         Core Components/Risk Factors/Patient Goals at Discharge (Final Review):   Goals and Risk Factor Review - 08/30/24 1600       Core Components/Risk Factors/Patient Goals Review   Personal Goals Review Other;Tobacco Cessation    Review Patient  states he does not want to do anymore treatments for his lungs such as radiation. He speaks with his doctor about his CT results soon. Informed him that exercising will benifit his overall health and to continue if he wants to. He has not quit smoking and does not plan to quit looking at his past notes.    Expected Outcomes Short: continue to exercise. Long: maintain exercise independently.          ITP Comments:  ITP Comments     Row Name 05/30/24 1516 06/11/24 1404 06/27/24 0945 07/25/24 1409 08/22/24 1039   ITP Comments Completed and gym orientation for pulmonary rehab. Initial ITP created and sent for review to Dr. Faud Aleskerov, Medical Director. First full day of exercise!  Patient was oriented to gym and equipment including functions, settings, policies, and procedures.  Patient's individual exercise prescription and treatment plan were reviewed.  All starting workloads were established based on the results of the 6 minute walk test done at initial orientation visit.  The plan for exercise progression was also introduced and progression will be customized based on patient's performance and goals. 30 Day  review completed. Medical Director ITP review done; changes made as directed and signed approval by Medical Director. New to program. 30 Day review completed. Medical Director ITP review done; changes made as directed and signed approval by Medical Director. 30 Day review completed. Medical Director ITP review done; changes made as directed and signed approval by Medical Director.    Row Name 09/19/24 1249           ITP Comments 30 Day review completed. Medical Director ITP review done, changes made as directed, and signed approval by Medical Director.          Comments: 30 day review ITP

## 2024-09-20 ENCOUNTER — Encounter: Admitting: Emergency Medicine

## 2024-09-20 DIAGNOSIS — J449 Chronic obstructive pulmonary disease, unspecified: Secondary | ICD-10-CM

## 2024-09-20 NOTE — Progress Notes (Signed)
 Daily Session Note  Patient Details  Name: Paul Carpenter MRN: [id number] Date of Birth: 07/19/1956 Referring Provider:   [facility] from 05/30/2024 in [facility]  Referring Provider Dr. Dedra Sanders    Encounter Date: 09/20/2024  Check In:  Session Check In - 09/20/24 1559       Check-In   Supervising physician immediately available to respond to emergencies See telemetry face sheet for immediately available ER MD    Location [facility]-Cardiac & Pulmonary Rehab    Staff Present Leita Franks RN,BSN;[facility] BS, Exercise Physiologist;Noah Tickle, BS, Exercise Physiologist    Virtual Visit No    Medication changes reported     No    Fall or balance concerns reported    No    Tobacco Cessation No Change    Warm-up and Cool-down Performed on first and last piece of equipment    Resistance Training Performed Yes    VAD Patient? No    PAD/SET Patient? No      Pain Assessment   Currently in Pain? No/denies             Social History   Tobacco Use  Smoking Status Every Day   Current packs/day: 1.50   Average packs/day: 3.0 packs/day for 52.9 years (156.8 ttl pk-yrs)   Types: Cigarettes   Start date: 1973   Passive exposure: Current  Smokeless Tobacco Never    Goals Met:  Proper associated with RPD/PD & O2 Sat Independence with exercise equipment Using PLB without cueing & demonstrates good technique Exercise tolerated well No report of concerns or symptoms today Strength training completed today  Goals Unmet:  Not Applicable  Comments: Pt able to follow exercise prescription today without complaint.  Will continue to monitor for progression.    Dr. Oneil Pinal is Medical Director for [facility] Cardiac Rehabilitation.  Dr. Fuad Aleskerov is Medical Director for [facility] Pulmonary Rehabilitation.

## 2024-09-21 ENCOUNTER — Other Ambulatory Visit: Payer: Self-pay | Admitting: Pulmonary Disease

## 2024-09-21 NOTE — Telephone Encounter (Signed)
 Copied from CRM [id number]. Topic: Clinical - Medication Refill >> Sep 21, 2024 11:50 AM Joesph PARAS wrote: Medication: azithromycin  (ZITHROMAX ) 500 MG tablet  Has the patient contacted their pharmacy? Yes (Agent: If no, request that the patient contact the pharmacy for the refill. If patient does not wish to contact the pharmacy document the reason why and proceed with request.) (Agent: If yes, when and what did the pharmacy advise?)  This is the patient's preferred pharmacy:  [id number] - [place], [place] - [address] [place] [address] Phone: [phone] Fax: [phone]   Is this the correct pharmacy for this prescription? Yes If no, delete pharmacy and type the correct one.   Has the prescription been filled recently? No  Is the patient out of the medication? Yes  Has the patient been seen for an appointment in the last year OR does the patient have an upcoming appointment? Yes  Can we respond through MyChart? Yes  Agent: Please be advised that Rx refills may take up to 3 business days. We ask that you follow-up with your pharmacy.

## 2024-09-21 NOTE — Telephone Encounter (Signed)
 Patient advised to contact ID. NFN.

## 2024-09-24 ENCOUNTER — Telehealth: Payer: Self-pay

## 2024-09-24 ENCOUNTER — Encounter: Admitting: Emergency Medicine

## 2024-09-24 DIAGNOSIS — J449 Chronic obstructive pulmonary disease, unspecified: Secondary | ICD-10-CM | POA: Diagnosis not present

## 2024-09-24 NOTE — Telephone Encounter (Signed)
 Patient called requesting a refill on Azithromycin . [facility] [place].   Verland Sprinkle SHAUNNA Letters, CMA

## 2024-09-24 NOTE — Progress Notes (Signed)
 Daily Session Note  Patient Details  Name: Paul Carpenter MRN: [id number] Date of Birth: 05/17/1956 Referring Provider:   [facility] from 05/30/2024 in [facility] Cardiac and Pulmonary Rehab  Referring Provider Dr. Dedra Sanders    Encounter Date: 09/24/2024  Check In:  Session Check In - 09/24/24 1351       Check-In   Supervising physician immediately available to respond to emergencies See telemetry face sheet for immediately available ER MD    Location [facility]    Staff Present Leita Franks RN,BSN;Maxon Burnell BS, Exercise Physiologist;Margaret Best, MS, Exercise Physiologist;Noah Tickle, BS, Exercise Physiologist    Virtual Visit No    Medication changes reported     No    Fall or balance concerns reported    No    Tobacco Cessation No Change    Warm-up and Cool-down Performed on first and last piece of equipment    Resistance Training Performed Yes    VAD Patient? No    PAD/SET Patient? No      Pain Assessment   Currently in Pain? No/denies             Social History   Tobacco Use  Smoking Status Every Day   Current packs/day: 1.50   Average packs/day: 3.0 packs/day for 52.9 years (156.8 ttl pk-yrs)   Types: Cigarettes   Start date: 1973   Passive exposure: Current  Smokeless Tobacco Never    Goals Met:  Proper associated with RPD/PD & O2 Sat Independence with exercise equipment Using PLB without cueing & demonstrates good technique Exercise tolerated well No report of concerns or symptoms today Strength training completed today  Goals Unmet:  Not Applicable  Comments: Pt able to follow exercise prescription today without complaint.  Will continue to monitor for progression.   Reviewed home exercise with pt today from 2:05pm to 2:15pm.  Pt plans to walk and use a peddle machine at home for exercise.  Reviewed THR, pulse, RPE, sign and symptoms, pulse oximetery and when to call 911 or MD.  Also discussed weather  considerations and indoor options.  Pt voiced understanding.   Dr. Oneil Pinal is Medical Director for [facility] Cardiac Rehabilitation.  Dr. Fuad Aleskerov is Medical Director for [facility] Pulmonary Rehabilitation.

## 2024-09-28 ENCOUNTER — Other Ambulatory Visit: Payer: Self-pay | Admitting: Pulmonary Disease

## 2024-10-01 ENCOUNTER — Other Ambulatory Visit: Payer: Self-pay | Admitting: Pulmonary Disease

## 2024-10-01 ENCOUNTER — Encounter: Attending: Pulmonary Disease | Admitting: Emergency Medicine

## 2024-10-01 DIAGNOSIS — J441 Chronic obstructive pulmonary disease with (acute) exacerbation: Secondary | ICD-10-CM

## 2024-10-01 DIAGNOSIS — J449 Chronic obstructive pulmonary disease, unspecified: Secondary | ICD-10-CM | POA: Insufficient documentation

## 2024-10-01 NOTE — Telephone Encounter (Signed)
 Okay to refill Pulmicort  via nebulizer

## 2024-10-01 NOTE — Progress Notes (Signed)
 Daily Session Note  Patient Details  Name: Paul Carpenter MRN: [id number] Date of Birth: 1955/12/26 Referring Provider:   [facility] from 05/30/2024 in [facility] Cardiac and Pulmonary Rehab  Referring Provider Dr. Dedra Sanders    Encounter Date: 10/01/2024  Check In:  Session Check In - 10/01/24 1404       Check-In   Supervising physician immediately available to respond to emergencies See telemetry face sheet for immediately available ER MD    Location [facility]    Staff Present Leita Franks RN,BSN;Maxon Conetta BS, Exercise Physiologist;Noah Tickle, BS, Exercise Physiologist;Margaret Best, MS, Exercise Physiologist    Virtual Visit No    Medication changes reported     No    Fall or balance concerns reported    No    Tobacco Cessation No Change    Warm-up and Cool-down Performed on first and last piece of equipment    Resistance Training Performed Yes    VAD Patient? No    PAD/SET Patient? No      Pain Assessment   Currently in Pain? No/denies             Social History   Tobacco Use  Smoking Status Every Day   Current packs/day: 1.50   Average packs/day: 3.0 packs/day for 52.9 years (156.9 ttl pk-yrs)   Types: Cigarettes   Start date: 1973   Passive exposure: Current  Smokeless Tobacco Never    Goals Met:  Proper associated with RPD/PD & O2 Sat Independence with exercise equipment Using PLB without cueing & demonstrates good technique Exercise tolerated well No report of concerns or symptoms today Strength training completed today  Goals Unmet:  Not Applicable  Comments: Pt able to follow exercise prescription today without complaint.  Will continue to monitor for progression.    Dr. Oneil Pinal is Medical Director for [facility] Cardiac Rehabilitation.  Dr. Fuad Aleskerov is Medical Director for [facility] Pulmonary Rehabilitation.

## 2024-10-02 ENCOUNTER — Telehealth: Payer: Self-pay | Admitting: Infectious Diseases

## 2024-10-02 NOTE — Telephone Encounter (Signed)
 Spoke to patient Pt has MAC /COPD/lung cancer  After 2 years oF MAC therapy he stopped on 9/12 as he was tired of taking it and sputum still being positive Then on 08/07/24 he wanted to just start azithromycin   - We wanted to do ethambutol  as well but he did nto want both-as there was no difference in his symptoms  so now he is again off meds did not make any difference to his symptoms breathing or chest pain HE has follow up with Dr.[name] next week and will discuss with her the management plan going forward

## 2024-10-04 ENCOUNTER — Ambulatory Visit: Admitting: Pulmonary Disease

## 2024-10-04 ENCOUNTER — Encounter

## 2024-10-04 ENCOUNTER — Encounter: Payer: Self-pay | Admitting: Pulmonary Disease

## 2024-10-04 VITALS — BP 126/86 | HR 101 | Temp 97.7°F | Ht 72.0 in | Wt 160.0 lb

## 2024-10-04 DIAGNOSIS — I5020 Unspecified systolic (congestive) heart failure: Secondary | ICD-10-CM

## 2024-10-04 DIAGNOSIS — E2740 Unspecified adrenocortical insufficiency: Secondary | ICD-10-CM

## 2024-10-04 DIAGNOSIS — R0902 Hypoxemia: Secondary | ICD-10-CM | POA: Diagnosis not present

## 2024-10-04 DIAGNOSIS — J449 Chronic obstructive pulmonary disease, unspecified: Secondary | ICD-10-CM | POA: Diagnosis not present

## 2024-10-04 DIAGNOSIS — Z9981 Dependence on supplemental oxygen: Secondary | ICD-10-CM | POA: Diagnosis not present

## 2024-10-04 DIAGNOSIS — G4736 Sleep related hypoventilation in conditions classified elsewhere: Secondary | ICD-10-CM | POA: Diagnosis not present

## 2024-10-04 DIAGNOSIS — F17210 Nicotine dependence, cigarettes, uncomplicated: Secondary | ICD-10-CM

## 2024-10-04 DIAGNOSIS — A310 Pulmonary mycobacterial infection: Secondary | ICD-10-CM

## 2024-10-04 MED ORDER — METHYLPREDNISOLONE 4 MG PO TBPK: ORAL_TABLET | ORAL | 0 refills | Status: DC

## 2024-10-04 NOTE — Patient Instructions (Addendum)
 VISIT SUMMARY:  Today, we discussed your worsening shortness of breath and overall respiratory health. We reviewed your current medications and made some adjustments to help manage your symptoms better. We also talked about the importance of smoking cessation and your ongoing efforts to quit smoking.  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 Your oxygen levels drop significantly without supplemental oxygen. We have prescribed Medrol  to help with wheezing and started you on Ohtuvayre  nebulizer medication twice daily to reduce lung inflammation.  While you are on the Medrol  please hold your Cortef , resume that once you complete the taper pack.  Your medication schedule has been adjusted: take Pulmicort  and formoterol  in the morning and evening, Ohtuvayre  with these, and Yupelri  at noon. Continue using your supplemental oxygen therapy and work on quitting smoking.  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 Continue taking Upelri at noon as part of your adjusted medication schedule.  -TOBACCO USE DISORDER: This refers to the ongoing use of tobacco products. You have reduced your smoking to less than a pack per day. Continue using nicotine  patches to help you quit smoking completely, as this is crucial for improving your COPD symptoms and overall health.  INSTRUCTIONS:  Please continue with your current medications as adjusted and use your supplemental oxygen as needed. Follow up on the results of your recent echocardiogram. Keep attending pulmonary therapy and work on quitting smoking completely. If you experience any worsening symptoms or have any concerns, please contact our office.

## 2024-10-04 NOTE — Progress Notes (Signed)
 Subjective:    Patient ID: Paul Carpenter, male    DOB: September 17, 1956, 68 y.o.   MRN: [id number]  Patient Care Team: Fayette Bodily, MD as PCP - General (Infectious Diseases) Dew, Selinda RAMAN, MD as Referring Physician (Vascular Surgery) Jacobo Evalene PARAS, MD as Consulting Physician (Oncology) Lenn Aran, MD as Referring Physician (Radiation Oncology) Tamea Dedra CROME, MD as Consulting Physician (Pulmonary Disease) Fayette Bodily, MD as Consulting Physician (Infectious Diseases) Katie Vinie LOISE, MD as Referring Physician (Pulmonary Disease)  Chief Complaint  Patient presents with   COPD    Cough with green phlegm, shortness of breath and wheezing.     BACKGROUND/INTERVAL:Paul Carpenter is a very complex 68 year old current smoker (1.0 PPD, 156 PY) with a history as noted below, who presents for follow-up of a cavitary right upper lobe process in the setting of history of non-small cell carcinoma of the lung, Mycobacterium avium complex infection, stage IV COPD and chronic dyspnea/fatigue.  This is a scheduled visit.  He was last seen 02 August 2024, he was also followed at [facility] at the pulmonary specialty clinic for management of his resistant MAC.  MAC medications were discontinued due to persistence of disease and nonresponse.  He smoking 1 pack of cigarettes per day.   HPI Discussed the use of AI scribe software for clinical note transcription with the patient, who gave verbal consent to proceed.  History of Present Illness   Paul Carpenter is a 68 year old male with severe COPD and chronic MAC infection who presents with worsening shortness of breath.  He feels exhausted, miserable, and tired, with worsening shortness of breath. He has reduced smoking to less than a pack a day and uses nicotine  patches daily. Remembering all his medications is challenging.  He recently underwent an echocardiogram at [facility], but results are pending. An incident occurred  where he attempted to manage without oxygen, resulting in oxygen saturation dropping to 72%, and he struggled to return to his oxygen machine. It takes him an hour to an hour and a half in the mornings to function without oxygen.  He sleeps soundly without coughing or breathing difficulties but experiences issues in the morning. He adjusted his oxygen use from three to two liters at night, as he felt worse in the mornings with higher oxygen levels. He attends pulmonary rehab regularly and generally does not require oxygen there, except on one occasion due to blood pressure concerns.  His current medications include Pulmicort , formoterol , which he uses twice a day and Yupelri , which he uses once a day.       Review of Systems A 10 point review of systems was performed and it is as noted above otherwise negative.   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/22/2021   Overweight (BMI 25.0-29.9) 09/22/2021   Hypomagnesemia 09/21/2021   Elevated hemoglobin A1c 06/19/2020   Hypothyroidism, acquired 05/30/2019   Squamous cell lung cancer, right (HCC) 05/30/2019   HFrEF (heart failure with reduced ejection fraction) (HCC) 03/06/2019  Atherosclerosis 12/14/2018   Non-small cell lung cancer, right (HCC) 04/24/2018   Oral ulcer 02/16/2018   Pituitary disorder 02/16/2018   Migraine headache 03/07/2017   HCAP (healthcare-associated pneumonia) 09/11/2016   COPD  exacerbation (HCC) 09/11/2016   Chronic hyponatremia 09/11/2016   Leukocytosis 09/11/2016   Thrombocytopenia 09/11/2016   Cigarette smoker 06/11/2016   Cervical radiculopathy 04/15/2016   Cervical disc disorder at C5-C6 level with radiculopathy 03/09/2016   Impingement syndrome of right shoulder 03/09/2016   Health care maintenance 09/29/2015   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   Benign essential hypertension 05/28/2015   Polycythemia 03/24/2015   Carotid artery disease 12/12/2014   Disequilibrium 12/12/2014   Mixed hyperlipidemia 12/10/2014   Incomplete emptying of bladder 06/04/2014   Anxiety 05/18/2014   Chronic coronary artery disease 05/18/2014   Chronic headaches 05/18/2014   Acute shoulder pain 03/15/2014   Impingement syndrome of left shoulder 03/15/2014   Lung mass 12/06/2013   Kidney stone 11/24/2013   COPD (chronic obstructive pulmonary disease) (HCC) 04/24/2013   Tobacco use disorder 04/24/2013   Obstructive sleep apnea 04/24/2013   Benign localized prostatic hyperplasia with lower urinary tract symptoms (LUTS) 07/04/2012   Encounter for long-term current use of medication 07/04/2012   ED (erectile dysfunction) of organic origin 07/04/2012   Testicular hypofunction 07/04/2012    Social History   Tobacco Use   Smoking status: Every Day    Current packs/day: 1.50    Average packs/day: 3.0 packs/day for 52.9 years (156.9 ttl pk-yrs)    Types: Cigarettes    Start date: 1973    Passive exposure: Current   Smokeless tobacco: Never  Substance Use Topics   Alcohol use: Not Currently    Alcohol/week: 2.0 standard drinks of alcohol    Types: 2 Standard drinks or equivalent per week    Comment: moderate    Allergies  Allergen Reactions   Lisinopril Rash   Varenicline  Rash    Current Meds  Medication Sig   acetaminophen  (TYLENOL ) 500 MG tablet Take 500 mg by mouth daily as needed.   albuterol  (PROVENTIL ) (2.5 MG/3ML) 0.083% nebulizer solution Take 3 mLs (2.5 mg total)  by nebulization 4 (four) times daily as needed for wheezing or shortness of breath.   albuterol  (VENTOLIN  HFA) 108 (90 Base) MCG/ACT inhaler INHALE 2 PUFFS BY MOUTH EVERY 4 HOURS AS NEEDED FOR WHEEZE OR FOR SHORTNESS OF BREATH   arformoterol  (BROVANA ) 15 MCG/2ML NEBU Take 2 mLs (15 mcg total) by nebulization 2 (two) times daily.   aspirin EC 81 MG tablet Take 81 mg by mouth daily. Swallow whole.   atorvastatin  (LIPITOR) 10 MG tablet Take 10 mg by mouth daily.   azithromycin  (ZITHROMAX ) 500 MG tablet Take 500 mg by mouth.   budesonide  (PULMICORT ) 0.5 MG/2ML nebulizer solution TAKE 2 ML (0.5 MG TOTAL) BY NEBULIZATION TWICE A DAY   clotrimazole  (MYCELEX ) 10 MG troche Take 10 mg by mouth 3 (three) times daily.   cyanocobalamin (VITAMIN B12) 1000 MCG/ML injection Inject 1,000 mcg into the muscle.   diazepam  (VALIUM ) 5 MG tablet Take 5 mg by mouth at bedtime as needed.   ferrous sulfate  325 (65 FE) MG tablet Take 325 mg by mouth at bedtime.   Fluocinolone Acetonide 0.01 % OIL PLEASE SEE ATTACHED FOR DETAILED DIRECTIONS   folic acid  (FOLVITE ) 1 MG tablet Take 1 mg by mouth daily.   guaiFENesin  (MUCINEX ) 600 MG 12 hr tablet Take 1 tablet (600 mg total) by mouth 2 (two) times daily.   hydrocortisone  (CORTEF )  10 MG tablet Take 2 tablets in the morning and 1 tablet mid afternoon (2 to 4 PM)   levothyroxine  (SYNTHROID ) 175 MCG tablet Take 175 mcg by mouth daily before breakfast.   lidocaine -prilocaine  (EMLA ) cream APPLY 1 APPLICATION TOPICALLY AS NEEDED.   magnesium  oxide (MAG-OX) 400 MG tablet Take 800 mg by mouth 2 (two) times daily.   methylPREDNISolone  (MEDROL  DOSEPAK) 4 MG TBPK tablet Take as directed in the package, this is a taper pack.   naproxen  sodium (ANAPROX  DS) 550 MG tablet Take 1 tablet (550 mg total) by mouth 2 (two) times daily as needed for moderate pain (pain score 4-6). Take with meals   Nebulizers (COMPRESSOR/NEBULIZER) MISC Use as directed for nebulized medications.   nicotine   (NICODERM CQ  - DOSED IN MG/24 HOURS) 21 mg/24hr patch Place 21 mg onto the skin daily.   nitroGLYCERIN  (NITROSTAT ) 0.4 MG SL tablet Place under the tongue.   OXYGEN Inhale 2 L into the lungs at bedtime.   pantoprazole  (PROTONIX ) 40 MG tablet Take 1 tablet (40 mg total) by mouth 2 (two) times daily before meals   promethazine -dextromethorphan  (PROMETHAZINE -DM) 6.25-15 MG/5ML syrup Take 5 mLs by mouth 4 (four) times daily as needed for cough.   sodium chloride  1 g tablet Take 5 tablets (5 g total) by mouth 3 (three) times daily with meals.   tadalafil  (CIALIS ) 5 MG tablet Take 1 tablet (5 mg total) by mouth daily as needed for erectile dysfunction.   Vitamin D, Ergocalciferol, (DRISDOL) 1.25 MG (50000 UNIT) CAPS capsule Take 50,000 Units by mouth once a week.   YUPELRI  175 MCG/3ML nebulizer solution TAKE 3 MLS (175 MCG TOTAL) BY NEBULIZATION DAILY. DX: J44.9    Immunization History  Administered Date(s) Administered   Fluad Quad(high Dose 65+) 09/22/2020, 08/01/2024   Influenza Inj Mdck Quad Pf 09/06/2016, 10/02/2019, 09/09/2021, 09/03/2022   Influenza Split 08/05/2013, 07/23/2014, 08/18/2015   Influenza Whole 08/01/2012   Influenza,inj,Quad PF,6+ Mos 08/14/2018   Influenza-Unspecified 09/06/2016, 08/17/2017, 09/09/2021   PNEUMOCOCCAL CONJUGATE-20 02/08/2024   Zoster Recombinant(Shingrix) 06/19/2020        Objective:     Vitals:   10/04/24 1446  BP: 126/86  Pulse: (!) 101  Temp: 97.7 F (36.5 C)  Height: 6' (1.829 m)  Weight: 160 lb (72.6 kg)  SpO2: 97%  TempSrc: Temporal  BMI (Calculated): 21.7     SpO2: 97 %  GENERAL: Chronically ill-appearing man, mild tachypnea.  No conversational dyspnea.  Awake and alert.   HEAD: Normocephalic, atraumatic.  EYES: Pupils equal, round, reactive to light.  No scleral icterus.  MOUTH: Oral mucosa moist.  No thrush. NECK: Supple. No thyromegaly. Trachea midline. No JVD.  No adenopathy. PULMONARY: Good air entry bilaterally. Amphoric  sounds on the right upper lung zone.  Coarse, without rhonchi or wheezes noted today.   CARDIOVASCULAR: S1 and S2.  Tachycardic, regular rhythm.  No rubs, murmurs or gallops heard. ABDOMEN: Benign. MUSCULOSKELETAL: No joint deformity, no clubbing, no edema.  NEUROLOGIC: No overt focal deficit. Speech is fluent. SKIN: Intact,warm,dry. PSYCH: Depressed mood and flat affect.        Assessment & Plan:     ICD-10-CM   1. Stage 4 very severe COPD by GOLD classification (HCC)  J44.9 Ambulatory referral to Pharmacotherapy Clinic    2. Nocturnal hypoxemia due to emphysema (HCC)  J43.9    G47.36     3. Mycobacterium avium complex (HCC) - resistant  A31.0     4. HFrEF (heart failure with reduced ejection  fraction) (HCC)  I50.20     5. Chronic adrenal insufficiency  E27.40     6. Tobacco dependence due to cigarettes  F17.210       Orders Placed This Encounter  Procedures   Ambulatory referral to Pharmacotherapy Clinic    Referral Priority:   Routine    Referral Type:   Consultation    Referral Reason:   Specialty Services Required    Number of Visits Requested:   1    Meds ordered this encounter  Medications   methylPREDNISolone  (MEDROL  DOSEPAK) 4 MG TBPK tablet    Sig: Take as directed in the package, this is a taper pack.    Dispense:  21 tablet    Refill:  0   Discussion:    Severe chronic obstructive pulmonary disease (COPD) with hypoxemia requiring supplemental oxygen Severe COPD with worsening dyspnea and hypoxemia. Oxygen saturation drops significantly without supplemental oxygen. Wheezing present. Current treatment includes multiple nebulizer medications. Smoking cessation is crucial for symptom improvement. - Prescribed prednisone  to address wheezing. - Initiated Ohtuvayre  nebulizer medication twice daily to address lung inflammation. - Adjusted medication schedule: Pulmicort  and formoterol  in the morning and evening, Ohtuvayre  with these, and Yupelri  at noon. -  Continue supplemental oxygen therapy. - Encouraged smoking cessation.  Chronic Mycobacterium avium complex (MAC) lung infection Chronic MAC lung infection contributing to respiratory symptoms.  He is off of antimicrobials for MAC due to ineffectiveness - Continue observation.  Tobacco use disorder Ongoing tobacco use disorder with current smoking reduced to less than a pack per day. Smoking cessation is critical for improving COPD symptoms and overall health. - Continue using nicotine  patches to aid smoking cessation. - Encouraged complete smoking cessation.     Follow-up will be in 2 months time.   Advised if symptoms do not improve or worsen, to please contact office for sooner follow up or seek emergency care.    I spent 40 minutes of dedicated to the care of this patient on the date of this encounter to include pre-visit review of records, face-to-face time with the patient discussing conditions above, post visit ordering of testing, clinical documentation with the electronic health record, making appropriate referrals as documented, and communicating necessary findings to members of the patients care team.     C. Leita Sanders, MD Advanced Bronchoscopy PCCM [facility]    *This note was generated using voice recognition software/Dragon and/or AI transcription program.  Despite best efforts to proofread, errors can occur which can change the meaning. Any transcriptional errors that result from this process are unintentional and may not be fully corrected at the time of dictation.

## 2024-10-08 ENCOUNTER — Encounter

## 2024-10-11 ENCOUNTER — Encounter: Admitting: *Deleted

## 2024-10-11 DIAGNOSIS — J449 Chronic obstructive pulmonary disease, unspecified: Secondary | ICD-10-CM

## 2024-10-11 NOTE — Progress Notes (Signed)
 Daily Session Note  Patient Details  Name: Paul Carpenter MRN: [id number] Date of Birth: 04-Feb-1956 Referring Provider:   [facility] from 05/30/2024 in [facility]  Referring Provider Dr. Dedra Sanders    Encounter Date: 10/11/2024  Check In:  Session Check In - 10/11/24 1542       Check-In   Supervising physician immediately available to respond to emergencies See telemetry face sheet for immediately available ER MD    Location [facility]-Cardiac & Pul[facility] Rehab    Staff Present Maxon Burnell HECKLE, Exercise Physiologist;Inigo Lantigua Tressa RN,BSN;Joseph Hood RCP,RRT,BSRT;Noah Tickle, [place], Exercise Physiologist    Virtual Visit No    Medication changes reported     No    Fall or balance concerns reported    No    Tobacco Cessation No Change    Current number of cigarettes/nicotine  per day     12    Warm-up and Cool-down Performed on first and last piece of equipment    Resistance Training Performed Yes    VAD Patient? No    PAD/SET Patient? No      Pain Assessment   Currently in Pain? No/denies             Tobacco Use History[1]  Goals Met:  Independence with exercise equipment Exercise tolerated well No report of concerns or symptoms today Strength training completed today  Goals Unmet:  Not Applicable  Comments: Pt able to follow exercise prescription today without complaint.  Will continue to monitor for progression.    Dr. Oneil Pinal is Medical Director for [facility] Cardiac Rehabilitation.  Dr. Fuad Aleskerov is Medical Director for [facility] Pulmonary Rehabilitation.    [1]  Social History Tobacco Use  Smoking Status Every Day   Current packs/day: 1.50   Average packs/day: 3.0 packs/day for 52.9 years (156.9 ttl pk-yrs)   Types: Cigarettes   Start date: 1973   Passive exposure: Current  Smokeless Tobacco Never

## 2024-10-12 ENCOUNTER — Telehealth: Payer: Self-pay

## 2024-10-12 NOTE — Telephone Encounter (Signed)
 Received [employer]  new start paperwork. Completed form and faxed with clinicals and insurance card copy to [facility]   Phone#: [phone] Fax#: [phone]

## 2024-10-15 ENCOUNTER — Encounter: Admitting: Emergency Medicine

## 2024-10-15 DIAGNOSIS — J449 Chronic obstructive pulmonary disease, unspecified: Secondary | ICD-10-CM | POA: Diagnosis not present

## 2024-10-15 NOTE — Progress Notes (Signed)
 Daily Session Note  Patient Details  Name: Paul Carpenter MRN: [id number] Date of Birth: January 07, 1956 Referring Provider:   [facility] from 05/30/2024 in [facility]  Referring Provider Dr. Dedra Sanders    Encounter Date: 10/15/2024  Check In:  Session Check In - 10/15/24 1403       Check-In   Supervising physician immediately available to respond to emergencies See telemetry face sheet for immediately available ER MD    Location [facility]    Staff Present Leita Franks RN,BSN;Maxon Conetta BS, Exercise Physiologist;Noah Tickle, BS, Exercise Physiologist;Jason Elnor RDN,LDN    Virtual Visit No    Medication changes reported     No    Fall or balance concerns reported    No    Tobacco Cessation No Change    Warm-up and Cool-down Performed on first and last piece of equipment    Resistance Training Performed Yes    VAD Patient? No    PAD/SET Patient? No      Pain Assessment   Currently in Pain? No/denies             Tobacco Use History[1]  Goals Met:  Proper associated with RPD/PD & O2 Sat Independence with exercise equipment Using PLB without cueing & demonstrates good technique Exercise tolerated well No report of concerns or symptoms today Strength training completed today  Goals Unmet:  Not Applicable  Comments: Pt able to follow exercise prescription today without complaint.  Will continue to monitor for progression.    Dr. Oneil Pinal is Medical Director for [facility] Cardiac Rehabilitation.  Dr. Fuad Aleskerov is Medical Director for [facility] Pulmonary Rehabilitation.    [1]  Social History Tobacco Use  Smoking Status Every Day   Current packs/day: 1.50   Average packs/day: 3.0 packs/day for 53.0 years (156.9 ttl pk-yrs)   Types: Cigarettes   Start date: 1973   Passive exposure: Current  Smokeless Tobacco Never

## 2024-10-15 NOTE — Telephone Encounter (Signed)
 Received fax from [employer] confirming receipt of enrollment form.  Patient ID: [id number]

## 2024-10-17 DIAGNOSIS — J449 Chronic obstructive pulmonary disease, unspecified: Secondary | ICD-10-CM

## 2024-10-17 NOTE — Telephone Encounter (Signed)
 Received fax from [employer] with summary of benefits. Referral form for Ohtuvayre  received. Rx will be triaged to [facility] (phone: [phone]). Once benefits investigation completed, pharmacy will reach out the patient to schedule shipment. If medication is unaffordable, patient will need to express financial hardship to be referred back to [facility] for patient assistance program pre-screening.   Patient ID: [id number] Pharmacy phone: [facility] (phone: [phone]) [facility] Phone#: [phone]

## 2024-10-17 NOTE — Progress Notes (Signed)
 Pulmonary Individual Treatment Plan  Patient Details  Name: Paul Carpenter MRN: [id number] Date of Birth: 04/20/1956 Referring Provider:   Conrad [facility]s Pulmonary Rehab from 05/30/2024 in [facility] Card[facility] Pulmonary Rehab  Referring Provider Dr. Dedra Sanders    Initial Encounter Date:  Flowsheet Row Pulmonary Rehab from 05/30/2024 in [facility] Cardiac and Pulmonary Rehab  Date 05/30/24    Visit Diagnosis: Chronic obstructive pulmonary disease, unspecified COPD type (HCC)  Patient's Home Medications on Admission: Current Medications[1]  Past Medical History: Past Medical History:  Diagnosis Date   Anginal pain    Anxiety    Asthma    Chest pain    CHF (congestive heart failure) (HCC)    Chicken pox    Complication of anesthesia    o2 dropped after neck fusion   COPD (chronic obstructive pulmonary disease) (HCC)    Coronary artery disease    Cough    chronic  clear phlegm   Dysrhythmia    palpitations   GERD (gastroesophageal reflux disease)    h/o reflux/ hoarsness   Hematochezia    Hemorrhoids    History of chickenpox    History of colon polyps    History of Helicobacter pylori infection    Hoarseness    Hypertension    Lung cancer (HCC) 05/2016   Chemo + rad tx's.    Migraines    OSA (obstructive sleep apnea)    has CPAP but does not use   Personal history of tobacco use, presenting hazards to health 03/05/2016   Pneumonia    5/17   Raynaud disease    Raynaud disease    Raynaud's disease    Rotator cuff tear    on right   Shortness of breath dyspnea    Sleep apnea    Ulcer (traumatic) of oral mucosa     Tobacco Use: Tobacco Use History[2]  Labs: Review Flowsheet        No data to display           Pulmonary Assessment Scores:  Pulmonary Assessment Scores     Row Name 05/30/24 1525 06/11/24 1717       ADL UCSD   ADL Phase -- Entry    SOB Score total -- 89    Rest -- 3    Walk -- 3    Stairs -- 5    Bath -- 4    Dress -- 2    Shop  -- 5      CAT Score   CAT Score 34 --      mMRC Score   mMRC Score 3 --       UCSD: Self-administered rating of dyspnea associated with activities of daily living (ADLs) 6-point scale (0 = not at all to 5 = maximal or unable to do because of breathlessness)  Scoring Scores range from 0 to 120.  Minimally important difference is 5 units  CAT: CAT can identify the health impairment of COPD patients and is better correlated with disease progression.  CAT has a scoring range of zero to 40. The CAT score is classified into four groups of low (less than 10), medium (10 - 20), high (21-30) and very high (31-40) based on the impact level of disease on health status. A CAT score over 10 suggests significant symptoms.  A worsening CAT score could be explained by an exacerbation, poor medication adherence, poor inhaler technique, or progression of COPD or comorbid conditions.  CAT MCID is 2 points  mMRC: mMRC (Modified Medical Research Council) Dyspnea Scale is used to assess the degree of baseline functional disability in patients of respiratory disease due to dyspnea. No minimal important difference is established. A decrease in score of 1 point or greater is considered a positive change.   Pulmonary Function Assessment:   Exercise Target Goals: Exercise Program Goal: Individual exercise prescription set using results from initial 6 min walk test and THRR while considering  patients activity barriers and safety.   Exercise Prescription Goal: Initial exercise prescription builds to 30-45 minutes a day of aerobic activity, 2-3 days per week.  Home exercise guidelines will be given to patient during program as part of exercise prescription that the participant will acknowledge.  Education: Aerobic Exercise: - Group verbal and visual presentation on the components of exercise prescription. Introduces F.I.T.T principle from ACSM for exercise prescriptions.  Reviews F.I.T.T. principles of  aerobic exercise including progression. Written material provided at class time.   Education: Resistance Exercise: - Group verbal and visual presentation on the components of exercise prescription. Introduces F.I.T.T principle from ACSM for exercise prescriptions  Reviews F.I.T.T. principles of resistance exercise including progression. Written material provided at class time.    Education: Exercise & Equipment Safety: - Individual verbal instruction and demonstration of equipment use and safety with use of the equipment. Flowsheet Row Pulmonary Rehab from 05/30/2024 in [facility] Cardiac and Pulmonary Rehab  Date 05/30/24  Educator [facility]  Instruction Review Code 1- Verbalizes Understanding    Education: Exercise Physiology & General Exercise Guidelines: - Group verbal and written instruction with models to review the exercise physiology of the cardiovascular system and associated critical values. Provides general exercise guidelines with specific guidelines to those with heart or lung disease.    Education: Flexibility, Balance, Mind/Body Relaxation: - Group verbal and visual presentation with interactive activity on the components of exercise prescription. Introduces F.I.T.T principle from ACSM for exercise prescriptions. Reviews F.I.T.T. principles of flexibility and balance exercise training including progression. Also discusses the mind body connection.  Reviews various relaxation techniques to help reduce and manage stress (i.e. Deep breathing, progressive muscle relaxation, and visualization). Balance handout provided to take home. Written material provided at class time.   Activity Barriers & Risk Stratification:  Activity Barriers & Cardiac Risk Stratification - 05/30/24 1519       Activity Barriers & Cardiac Risk Stratification   Activity Barriers Deconditioning;Muscular Weakness;Shortness of Breath;Other (comment)    Comments Shoulder mobility concerns          6 Minute Walk:  6  Minute Walk     Row Name 05/30/24 1516         6 Minute Walk   Phase Initial     Distance 670 feet     Walk Time 6 minutes     # of Rest Breaks 0     MPH 1.27     METS 2.43     RPE 13     Perceived Dyspnea  3     VO2 Peak 8.52     Symptoms Yes (comment)     Comments dizziness, SOB     Resting HR 88 bpm     Resting BP 114/62     Resting Oxygen Saturation  93 %     Exercise Oxygen Saturation  during 6 min walk 89 %     Max Ex. HR 99 bpm     Max Ex. BP 134/64     2 Minute Post BP 126/68  Interval HR   1 Minute HR 99     2 Minute HR 94     3 Minute HR 91     4 Minute HR 95     5 Minute HR 97     6 Minute HR 99     2 Minute Post HR 87     Interval Heart Rate? Yes       Interval Oxygen   Interval Oxygen? Yes     Baseline Oxygen Saturation % 93 %     1 Minute Oxygen Saturation % 90 %     1 Minute Liters of Oxygen 0 L     2 Minute Oxygen Saturation % 93 %     2 Minute Liters of Oxygen 0 L     3 Minute Oxygen Saturation % 88 %     3 Minute Liters of Oxygen 0 L     4 Minute Oxygen Saturation % 89 %     4 Minute Liters of Oxygen 0 L     5 Minute Oxygen Saturation % 90 %     5 Minute Liters of Oxygen 0 L     6 Minute Oxygen Saturation % 93 %     6 Minute Liters of Oxygen 0 L     2 Minute Post Oxygen Saturation % 95 %     2 Minute Post Liters of Oxygen 0 L       Oxygen Initial Assessment:  Oxygen Initial Assessment - 05/30/24 1524       Home Oxygen   Sleep Oxygen Prescription Continuous    Liters per minute 3    Home Exercise Oxygen Prescription Continuous    Liters per minute 3    Home Resting Oxygen Prescription None    Compliance with Home Oxygen Use Yes      Initial 6 min Walk   Oxygen Used None      Program Oxygen Prescription   Program Oxygen Prescription None      Intervention   Short Term Goals To learn and demonstrate proper pursed lip breathing techniques or other breathing techniques.     Long  Term Goals Exhibits proper breathing  techniques, such as pursed lip breathing or other method taught during program session          Oxygen Re-Evaluation:  Oxygen Re-Evaluation     Row Name 06/11/24 1413 07/19/24 1552 08/30/24 1607         Program Oxygen Prescription   Program Oxygen Prescription -- None None       Home Oxygen   Home Oxygen Device -- Home Concentrator Home Concentrator     Sleep Oxygen Prescription -- Continuous Continuous     Liters per minute -- 2 2     Home Exercise Oxygen Prescription -- None None     Liters per minute -- --  prn --  prn     Home Resting Oxygen Prescription -- None None     Compliance with Home Oxygen Use -- Yes Yes       Goals/Expected Outcomes   Short Term Goals -- To learn and demonstrate proper pursed lip breathing techniques or other breathing techniques.  To learn and demonstrate proper use of respiratory medications     Long  Term Goals -- Exhibits proper breathing techniques, such as pursed lip breathing or other method taught during program session Compliance with respiratory medication     Comments Reviewed PLB technique with pt.  Talked about  how it works and it's importance in maintaining their exercise saturations. Informed patient how to perform the Pursed Lipped breathing technique. Told patient to Inhale through the nose and out the mouth with pursed lips to keep their airways open, help oxygenate them better, practice when at rest or doing strenuous activity. Patient Verbalizes understanding of technique and will work on and be reiterated during [facility]. Paul Carpenter takes his breathing treatments as needed. He uses oxygen at night and monitors his oxygen level. He knows to keep his oxygen above 88 percent.     Goals/Expected Outcomes Short: Become more profiecient at using PLB.   Long: Become independent at using PLB. Short: use PLB with exertion. Long: use PLB on exertion proficiently and independently. Short: continue to exercise to improve lung function. Long: maintain  exercise post [facility].        Oxygen Discharge (Final Oxygen Re-Evaluation):  Oxygen Re-Evaluation - 08/30/24 1607       Program Oxygen Prescription   Program Oxygen Prescription None      Home Oxygen   Home Oxygen Device Home Concentrator    Sleep Oxygen Prescription Continuous    Liters per minute 2    Home Exercise Oxygen Prescription None    Liters per minute --   prn   Home Resting Oxygen Prescription None    Compliance with Home Oxygen Use Yes      Goals/Expected Outcomes   Short Term Goals To learn and demonstrate proper use of respiratory medications    Long  Term Goals Compliance with respiratory medication    Comments Paul Carpenter takes his breathing treatments as needed. He uses oxygen at night and monitors his oxygen level. He knows to keep his oxygen above 88 percent.    Goals/Expected Outcomes Short: continue to exercise to improve lung function. Long: maintain exercise post [facility].          Initial Exercise Prescription:  Initial Exercise Prescription - 05/30/24 1500       Date of Initial Exercise RX and Referring Provider   Date 05/30/24    Referring Provider Dr. Dedra Sanders      Oxygen   Oxygen Continuous    Liters 0-2L    Maintain Oxygen Saturation 88% or higher      Recumbant Bike   Level 1    RPM 50    Watts 25    Minutes 15    METs 2.43      NuStep   Level 1    SPM 80    Minutes 15    METs 2.43      REL-XR   Level 1    Watts 25    Speed 50    Minutes 15    METs 2.43      Track   Laps 18    Minutes 15    METs 1.98      Intensity   THRR 40-80% of Max Heartrate 114-140    Ratings of Perceived Exertion 11-13    Perceived Dyspnea 0-4      Resistance Training   Training Prescription Yes    Weight 3lb    Reps 10-15          Perform Capillary Blood Glucose checks as needed.  Exercise Prescription Changes:   Exercise Prescription Changes     Row Name 05/30/24 1500 06/20/24 1100 07/04/24 1500 07/19/24 0800 08/02/24  1600     Response to Exercise   Blood Pressure (Admit) 114/62 134/70 122/60 128/76 110/62  Blood Pressure (Exercise) 134/64 154/82 142/82 122/62 142/60   Blood Pressure (Exit) 126/68 116/64 136/78 126/62 128/72   Heart Rate (Admit) 88 bpm 92 bpm 92 bpm 103 bpm 100 bpm   Heart Rate (Exercise) 99 bpm 101 bpm 113 bpm 90 bpm 106 bpm   Heart Rate (Exit) 81 bpm 88 bpm 98 bpm 85 bpm 93 bpm   Oxygen Saturation (Admit) 93 % 94 % 93 % 96 % 96 %   Oxygen Saturation (Exercise) 89 % 94 % 89 % 93 % 93 %   Oxygen Saturation (Exit) 95 % 96 % 94 % 96 % 95 %   Rating of Perceived Exertion (Exercise) 13 13 15 11 13    Perceived Dyspnea (Exercise) 3 3 3 3 3    Symptoms Dizziness, SOB none none none none   Comments results first 2 weeks of exercise -- -- --   Duration -- Progress to 30 minutes of  aerobic without signs/symptoms of physical distress Progress to 30 minutes of  aerobic without signs/symptoms of physical distress Progress to 30 minutes of  aerobic without signs/symptoms of physical distress Progress to 30 minutes of  aerobic without signs/symptoms of physical distress   Intensity -- THRR unchanged THRR unchanged THRR unchanged THRR unchanged     Progression   Progression -- Continue to progress workloads to maintain intensity without signs/symptoms of physical distress. Continue to progress workloads to maintain intensity without signs/symptoms of physical distress. Continue to progress workloads to maintain intensity without signs/symptoms of physical distress. Continue to progress workloads to maintain intensity without signs/symptoms of physical distress.   Average METs -- 1.37 1.72 1.2 1.21     Resistance Training   Training Prescription -- Yes Yes Yes Yes   Weight -- 3lb 3lb 3lb 3lb   Reps -- 10-15 10-15 10-15 10-15     Interval Training   Interval Training -- No No No No     Oxygen   Oxygen -- Continuous -- -- --   Liters -- 0 -- -- --     Treadmill   MPH -- -- -- -- 0.7    Grade -- -- -- -- 0   Minutes -- -- -- -- 15   METs -- -- -- -- 1.54     Recumbant Bike   Level -- -- 1 -- --   Watts -- -- 25 -- --   Minutes -- -- 15 -- --   METs -- -- 3.09 -- --     NuStep   Level -- 2 3 1 2    Minutes -- 15 15 30 30    METs -- 1.5 1.1 1.2 1.3     REL-XR   Level -- -- 1 -- 1   Minutes -- -- 15 -- 15   METs -- -- -- -- 1     Biostep-RELP   Level -- 1 -- -- --   Minutes -- 15 -- -- --   METs -- 1 -- -- --     Track   Laps -- 9 -- -- --   Minutes -- 15 -- -- --   METs -- 1.49 -- -- --     Oxygen   Maintain Oxygen Saturation -- 88% or higher 88% or higher 88% or higher 88% or higher    Row Name 08/15/24 1100 08/27/24 1100 09/12/24 1100 09/24/24 1100 09/24/24 1400     Response to Exercise   Blood Pressure (Admit) 162/84 124/72 140/72 130/80 --   Blood Pressure (  Exit) 132/72 140/76 128/72 126/82 --   Heart Rate (Admit) 94 bpm 100 bpm 95 bpm 96 bpm --   Heart Rate (Exercise) 100 bpm 102 bpm 99 bpm 99 bpm --   Heart Rate (Exit) 88 bpm 94 bpm 92 bpm 92 bpm --   Oxygen Saturation (Admit) 95 % 95 % 93 % 90 % --   Oxygen Saturation (Exercise) 94 % 94 % 89 % 91 % --   Oxygen Saturation (Exit) 98 % 94 % 96 % 90 % --   Rating of Perceived Exertion (Exercise) 13 14 15 12  --   Perceived Dyspnea (Exercise) 3 -- 3 1 --   Symptoms none none none none --   Duration Progress to 30 minutes of  aerobic without signs/symptoms of physical distress Progress to 30 minutes of  aerobic without signs/symptoms of physical distress Continue with 30 min of aerobic exercise without signs/symptoms of physical distress. Continue with 30 min of aerobic exercise without signs/symptoms of physical distress. Continue with 30 min of aerobic exercise without signs/symptoms of physical distress.   Intensity THRR unchanged THRR unchanged THRR unchanged THRR unchanged THRR unchanged     Progression   Progression Continue to progress workloads to maintain intensity without signs/symptoms of  physical distress. Continue to progress workloads to maintain intensity without signs/symptoms of physical distress. Continue to progress workloads to maintain intensity without signs/symptoms of physical distress. Continue to progress workloads to maintain intensity without signs/symptoms of physical distress. Continue to progress workloads to maintain intensity without signs/symptoms of physical distress.   Average METs 1.45 1.4 1.4 1.4 1.4     Resistance Training   Training Prescription Yes Yes Yes Yes Yes   Weight 3 lb 3 lb 3 lb 3 lb 3 lb   Reps 10-15 10-15 10-15 10-15 10-15     Interval Training   Interval Training No No No No No     NuStep   Level 2 3 2 2 2    Minutes 30 30 30 30 30    METs 1.7 -- 1.5 1.4 1.4     T5 Nustep   Level -- 1 -- -- --   Minutes -- 15 -- -- --   METs -- 1.4 -- -- --     Home Exercise Plan   Plans to continue exercise at -- -- -- -- Home (comment)  walk and peddle machine   Frequency -- -- -- -- Add 2 additional days to program exercise sessions.   Initial Home Exercises Provided -- -- -- -- 09/24/24     Oxygen   Maintain Oxygen Saturation 88% or higher 88% or higher 88% or higher 88% or higher 88% or higher    Row Name 10/10/24 0700             Response to Exercise   Blood Pressure (Admit) 132/70       Blood Pressure (Exit) 118/64       Heart Rate (Admit) 104 bpm       Heart Rate (Exercise) 113 bpm       Heart Rate (Exit) 104 bpm       Oxygen Saturation (Admit) 90 %       Oxygen Saturation (Exercise) 92 %       Oxygen Saturation (Exit) 96 %       Rating of Perceived Exertion (Exercise) 13       Perceived Dyspnea (Exercise) 2       Symptoms none  Duration Continue with 30 min of aerobic exercise without signs/symptoms of physical distress.       Intensity THRR unchanged         Progression   Progression Continue to progress workloads to maintain intensity without signs/symptoms of physical distress.       Average METs 1          Resistance Training   Training Prescription Yes       Weight 3 lb       Reps 10-15         Interval Training   Interval Training No         REL-XR   Level 1       Minutes 15       METs 1         Home Exercise Plan   Plans to continue exercise at Home (comment)  walk and peddle machine       Frequency Add 2 additional days to program exercise sessions.       Initial Home Exercises Provided 09/24/24         Oxygen   Maintain Oxygen Saturation 88% or higher          Exercise Comments:   Exercise Comments     Row Name 06/11/24 1404           Exercise Comments First full day of exercise!  Patient was oriented to gym and equipment including functions, settings, policies, and procedures.  Patient's individual exercise prescription and treatment plan were reviewed.  All starting workloads were established based on the results of the 6 minute walk test done at initial orientation visit.  The plan for exercise progression was also introduced and progression will be customized based on patient's performance and goals.          Exercise Goals and Review:   Exercise Goals     Row Name 05/30/24 1523             Exercise Goals   Increase Physical Activity Yes       Intervention Provide advice, education, support and counseling about physical activity/exercise needs.;Develop an individualized exercise prescription for aerobic and resistive training based on initial evaluation findings, risk stratification, comorbidities and participant's personal goals.       Expected Outcomes Short Term: Attend rehab on a regular basis to increase amount of physical activity.;Long Term: Exercising regularly at least 3-5 days a week.;Long Term: Add in home exercise to make exercise part of routine and to increase amount of physical activity.       Increase Strength and Stamina Yes       Intervention Provide advice, education, support and counseling about physical activity/exercise needs.;Develop an  individualized exercise prescription for aerobic and resistive training based on initial evaluation findings, risk stratification, comorbidities and participant's personal goals.       Expected Outcomes Short Term: Increase workloads from initial exercise prescription for resistance, speed, and METs.;Short Term: Perform resistance training exercises routinely during rehab and add in resistance training at home;Long Term: Improve cardiorespiratory fitness, muscular endurance and strength as measured by increased METs and functional capacity ( )       Able to understand and use rate of perceived exertion (RPE) scale Yes       Intervention Provide education and explanation on how to use RPE scale       Expected Outcomes Short Term: Able to use RPE daily in rehab to express subjective intensity level;Long Term:  Able  to use RPE to guide intensity level when exercising independently       Able to understand and use Dyspnea scale Yes       Intervention Provide education and explanation on how to use Dyspnea scale       Expected Outcomes Short Term: Able to use Dyspnea scale daily in rehab to express subjective sense of shortness of breath during exertion;Long Term: Able to use Dyspnea scale to guide intensity level when exercising independently       Knowledge and understanding of Target Heart Rate Range (THRR) Yes       Intervention Provide education and explanation of THRR including how the numbers were predicted and where they are located for reference       Expected Outcomes Short Term: Able to state/look up THRR;Short Term: Able to use daily as guideline for intensity in rehab;Long Term: Able to use THRR to govern intensity when exercising independently       Able to check pulse independently Yes       Intervention Provide education and demonstration on how to check pulse in carotid and radial arteries.;Review the importance of being able to check your own pulse for safety during independent exercise        Expected Outcomes Long Term: Able to check pulse independently and accurately;Short Term: Able to explain why pulse checking is important during independent exercise       Understanding of Exercise Prescription Yes       Intervention Provide education, explanation, and written materials on patient's individual exercise prescription       Expected Outcomes Short Term: Able to explain program exercise prescription;Long Term: Able to explain home exercise prescription to exercise independently          Exercise Goals Re-Evaluation :  Exercise Goals Re-Evaluation     Row Name 06/11/24 1405 06/11/24 1407 06/20/24 1136 07/04/24 1519 07/19/24 0825     Exercise Goal Re-Evaluation   Exercise Goals Review Increase Physical Activity;Able to understand and use rate of perceived exertion (RPE) scale;Knowledge and understanding of Target Heart Rate Range (THRR);Understanding of Exercise Prescription;Increase Strength and Stamina;Able to understand and use Dyspnea scale;Able to check pulse independently Increase Physical Activity;Able to understand and use rate of perceived exertion (RPE) scale;Knowledge and understanding of Target Heart Rate Range (THRR);Understanding of Exercise Prescription;Increase Strength and Stamina;Able to understand and use Dyspnea scale;Able to check pulse independently Increase Physical Activity;Increase Strength and Stamina;Understanding of Exercise Prescription Increase Physical Activity;Increase Strength and Stamina;Understanding of Exercise Prescription Increase Physical Activity;Increase Strength and Stamina;Understanding of Exercise Prescription   Comments Reviewed RPE and dyspnea scale, THR and program prescription with pt today.  Pt voiced understanding and was given a copy of goals to take home. -- Paul Carpenter is off to a good start in the program. He has been able to attend his first 2 sessions during this review period. During his sessions he was able to walk 9 laps on the track,  and use the T4 nustep at level 2. We will continue to monitor his progress in the program. Paul Carpenter is off to a good start in the program. He increased to level 3 on the T4 nustep. He worked at level 1 on the recumbent bike and XR. We will continue to monitor his progress in the program. Paul Carpenter is is doing well in rehab and was only able to attend one session in this review. We will encourage him to be more consistent with rehab for exercise benefits. He decreased down  to level 1 on the nustep in this session for 30 minutes. We will continue to monitor his progress in the program.   Expected Outcomes Short: Use RPE daily to regulate intensity.  Long: Follow program prescription in THR. -- Short: Continue to follow exercise prescription. Long: Continue exercise to improve strength and stamina. Short: Continue to progressively increase T4 nustep workload. Long: Continue exercise to improve strength and stamina. Short: Be more consistent with rehab. Long: Continue exercise to improve strength and stamina.    Row Name 08/02/24 1656 08/15/24 1133 08/27/24 1147 09/12/24 1103 09/24/24 1120     Exercise Goal Re-Evaluation   Exercise Goals Review Increase Physical Activity;Increase Strength and Stamina;Understanding of Exercise Prescription Increase Physical Activity;Increase Strength and Stamina;Understanding of Exercise Prescription Increase Physical Activity;Increase Strength and Stamina;Understanding of Exercise Prescription Increase Physical Activity;Increase Strength and Stamina;Understanding of Exercise Prescription Increase Physical Activity;Increase Strength and Stamina;Understanding of Exercise Prescription   Comments Paul Carpenter is is doing well in rehab. He increased to level 2 on the T4 nustep. He maintained level 1 on the XR. He tried the treadmill and was able to do a speed of 0.71mph and no incline. We will continue to monitor his progress in the program. Paul Carpenter is is doing well in rehab. He has only worked on the T4  nustep since the last review. He continues to do well with 3 lb handweights for resistance training. We will continue to monitor his progress in the program. Paul Carpenter continues to do well in rehab. He was only able to attend 2 sessions during this review period, but was able to increase from level 2 to 3 on the T4 nustep. We will continue to monitor his progress in the program. Paul Carpenter has only attended two sessions since the last review. He continues to work on the T4 nustep at level 2 for 30 minutes. We will continue to monitor his progress in the program. Paul Carpenter has only attended one session during this review period. During his one session he was able to use the T4 nustep at level 2 for 30 minutes. We will continue to monitor his progress in the program.   Expected Outcomes Short: Continue to progressively increase XR and nustep workloads. Long: Continue exercise to improve strength and stamina. Short: Use other aerobic machines for exercise. Long: Continue exercise to improve strength and stamina. Short: Attend rehab more frequently. Long: Continue exercise to improve strength and stamina. Short: Attend rehab more consistently. Long: Continue exercise to improve strength and stamina. Short: Attend rehab more consistently. Long: Continue exercise to improve strength and stamina.    Row Name 09/24/24 1426 10/10/24 0743           Exercise Goal Re-Evaluation   Exercise Goals Review Increase Physical Activity;Able to understand and use rate of perceived exertion (RPE) scale;Knowledge and understanding of Target Heart Rate Range (THRR);Understanding of Exercise Prescription;Increase Strength and Stamina;Able to understand and use Dyspnea scale;Able to check pulse independently Increase Physical Activity;Increase Strength and Stamina;Understanding of Exercise Prescription      Comments Reviewed home exercise with pt today from 2:05pm to 2:15pm.  Pt plans to walk and use a peddle machine at home for exercise.  Reviewed  THR, pulse, RPE, sign and symptoms, pulse oximetery and when to call 911 or MD.  Also discussed weather considerations and indoor options.  Pt voiced understanding. Paul Carpenter is doing well in rehab. He was only able to attend 2 sessions during this review period, and was only able to use the XR  during those sessions. We will continue to monitor his progress in the program.      Expected Outcomes Short: add 1-2 days a week of exercise at home on off days of rehab. Long: maintain independent exercise routine upon graduation from pulmonary rehab. Short: Attend rehab more frequently. Long: Continue exercise to improve strength and stamina.         Discharge Exercise Prescription (Final Exercise Prescription Changes):  Exercise Prescription Changes - 10/10/24 0700       Response to Exercise   Blood Pressure (Admit) 132/70    Blood Pressure (Exit) 118/64    Heart Rate (Admit) 104 bpm    Heart Rate (Exercise) 113 bpm    Heart Rate (Exit) 104 bpm    Oxygen Saturation (Admit) 90 %    Oxygen Saturation (Exercise) 92 %    Oxygen Saturation (Exit) 96 %    Rating of Perceived Exertion (Exercise) 13    Perceived Dyspnea (Exercise) 2    Symptoms none    Duration Continue with 30 min of aerobic exercise without signs/symptoms of physical distress.    Intensity THRR unchanged      Progression   Progression Continue to progress workloads to maintain intensity without signs/symptoms of physical distress.    Average METs 1      Resistance Training   Training Prescription Yes    Weight 3 lb    Reps 10-15      Interval Training   Interval Training No      REL-XR   Level 1    Minutes 15    METs 1      Home Exercise Plan   Plans to continue exercise at Home (comment)   walk and peddle machine   Frequency Add 2 additional days to program exercise sessions.    Initial Home Exercises Provided 09/24/24      Oxygen   Maintain Oxygen Saturation 88% or higher          Nutrition:  Target Goals:  Understanding of nutrition guidelines, daily intake of sodium 1500mg , cholesterol 200mg , calories 30% from fat and 7% or less from saturated fats, daily to have 5 or more servings of fruits and vegetables.  Education: Nutrition 1 -Group instruction provided by verbal, written material, interactive activities, discussions, models, and posters to present general guidelines for heart healthy nutrition including macronutrients, label reading, and promoting whole foods over processed counterparts. Education serves as pensions consultant of discussion of heart healthy eating for all. Written material provided at class time.     Education: Nutrition 2 -Group instruction provided by verbal, written material, interactive activities, discussions, models, and posters to present general guidelines for heart healthy nutrition including sodium, cholesterol, and saturated fat. Providing guidance of habit forming to improve blood pressure, cholesterol, and body weight. Written material provided at class time.     Biometrics:  Pre Biometrics - 05/30/24 1524       Pre Biometrics   Height 5' 11 (1.803 m)    Weight 159 lb 14.4 oz (72.5 kg)    Waist Circumference 34 inches    Hip Circumference 36.5 inches    Waist to Hip Ratio 0.93 %    BMI (Calculated) 22.31    Single Leg Stand 0 seconds           Nutrition Therapy Plan and Nutrition Goals:   Nutrition Assessments:  MEDIFICTS Score Key: >=70 Need to make dietary changes  40-70 Heart Healthy Diet <= 40 Therapeutic Level Cholesterol Diet  Flowsheet Row Pulmonary Rehab from 01/18/2024 in [facility] Cardiac and Pulmonary Rehab  Picture Your Plate Total Score on Admission 39   Picture Your Plate Scores: <59 Unhealthy dietary pattern with much room for improvement. 41-50 Dietary pattern unlikely to meet recommendations for good health and room for improvement. 51-60 More healthful dietary pattern, with some room for improvement.  >60 Healthy dietary  pattern, although there may be some specific behaviors that could be improved.   Nutrition Goals Re-Evaluation:  Nutrition Goals Re-Evaluation     Row Name 07/19/24 1555 08/30/24 1607           Goals   Comment Patient was informed on why it is important to maintain a balanced diet when dealing with Respiratory issues. Explained that it takes a lot of energy to breath and when they are short of breath often they will need to have a good diet to help keep up with the calories they are expending for breathing. RD appointment deferred.      Expected Outcome Short: Choose and plan snacks accordingly to patients caloric intake to improve breathing. Long: Maintain a diet independently that meets their caloric intake to aid in daily shortness of breath. --         Nutrition Goals Discharge (Final Nutrition Goals Re-Evaluation):  Nutrition Goals Re-Evaluation - 08/30/24 1607       Goals   Comment RD appointment deferred.          Psychosocial: Target Goals: Acknowledge presence or absence of significant depression and/or stress, maximize coping skills, provide positive support system. Participant is able to verbalize types and ability to use techniques and skills needed for reducing stress and depression.   Education: Stress, Anxiety, and Depression - Group verbal and visual presentation to define topics covered.  Reviews how body is impacted by stress, anxiety, and depression.  Also discusses healthy ways to reduce stress and to treat/manage anxiety and depression.  Written material provided at class time.   Education: Sleep Hygiene -Provides group verbal and written instruction about how sleep can affect your health.  Define sleep hygiene, discuss sleep cycles and impact of sleep habits. Review good sleep hygiene tips.    Initial Review & Psychosocial Screening:   Quality of Life Scores:  Scores of 19 and below usually indicate a poorer quality of life in these areas.  A  difference of  2-3 points is a clinically meaningful difference.  A difference of 2-3 points in the total score of the Quality of Life Index has been associated with significant improvement in overall quality of life, self-image, physical symptoms, and general health in studies assessing change in quality of life.  PHQ-9: Review Flowsheet  More data exists      08/30/2024 07/19/2024 05/30/2024 01/19/2024 03/10/2023  Depression screen PHQ 2/9  Decreased Interest 1 3 2 2  0  Down, Depressed, Hopeless 2 2 2  0 1  PHQ - 2 Score 3 5 4 2 1   Altered sleeping 3 3 3 2  -  Tired, decreased energy 3 3 3 3  -  Change in appetite 0 3 3 2  -  Feeling bad or failure about yourself  1 2 2 2  -  Trouble concentrating 0 1 1 1  -  Moving slowly or fidgety/restless 0 1 1 3  -  Suicidal thoughts 0 0 0 0 -  PHQ-9 Score 10  18  17  15   -  Difficult doing work/chores Very difficult Very difficult Very difficult Extremely dIfficult -  Details       Data saved with a previous flowsheet row definition        Interpretation of Total Score  Total Score Depression Severity:  1-4 = Minimal depression, 5-9 = Mild depression, 10-14 = Moderate depression, 15-19 = Moderately severe depression, 20-27 = Severe depression   Psychosocial Evaluation and Intervention:   Psychosocial Re-Evaluation:  Psychosocial Re-Evaluation     Row Name 07/19/24 1600 08/30/24 1605           Psychosocial Re-Evaluation   Current issues with Current Stress Concerns Current Stress Concerns      Comments Reviewed patient health questionnaire (PHQ-9) with patient for follow up. Previously, patients score indicated signs/symptoms of depression.  Reviewed to see if patient is improving symptom wise while in program.  Score declined and patient states that it is because he has been getting worse with his lung issues. He is not able to do the things that he used to do and finds himself short of breath alot. Reviewed patient health questionnaire  (PHQ-9) with patient for follow up. Previously, patients score indicated signs/symptoms of depression.  Reviewed to see if patient is improving symptom wise while in program.  Score improved and patient states that it is because he has been able to exercise some.      Expected Outcomes Short: Continue to work toward an improvement in PHQ9 scores by attending [facility] regularly. Long: Continue to improve stress and depression coping skills by talking with staff and attending [facility] regularly and work toward a positive mental state. Short: Continue to attend [facility] regularly for regular exercise and social engagement. Long: Continue to improve symptoms and manage a positive mental state.      Interventions Encouraged to attend Pulmonary Rehabilitation for the exercise Encouraged to attend Pulmonary Rehabilitation for the exercise      Continue Psychosocial Services  Follow up required by staff Follow up required by staff         Psychosocial Discharge (Final Psychosocial Re-Evaluation):  Psychosocial Re-Evaluation - 08/30/24 1605       Psychosocial Re-Evaluation   Current issues with Current Stress Concerns    Comments Reviewed patient health questionnaire (PHQ-9) with patient for follow up. Previously, patients score indicated signs/symptoms of depression.  Reviewed to see if patient is improving symptom wise while in program.  Score improved and patient states that it is because he has been able to exercise some.    Expected Outcomes Short: Continue to attend [facility] regularly for regular exercise and social engagement. Long: Continue to improve symptoms and manage a positive mental state.    Interventions Encouraged to attend Pulmonary Rehabilitation for the exercise    Continue Psychosocial Services  Follow up required by staff          Education: Education Goals: Education classes will be provided on a weekly basis, covering required topics.  Participant will state understanding/return demonstration of topics presented.  Learning Barriers/Preferences:   General Pulmonary Education Topics:  Infection Prevention: - Provides verbal and written material to individual with discussion of infection control including proper hand washing and proper equipment cleaning during exercise session. Flowsheet Row Pulmonary Rehab from 05/30/2024 in [facility] Cardiac and Pulmonary Rehab  Date 05/30/24  Educator [facility]  Instruction Review Code 1- Verbalizes Understanding    Falls Prevention: - Provides verbal and written material to individual with discussion of falls prevention and safety. Flowsheet Row Pulmonary Rehab from 05/30/2024 in [facility] Cardiac and Pulmonary Rehab  Date 05/30/24  Educator  [facility]  Instruction Review Code 1- Verbalizes Understanding    Chronic Lung Disease Review: - Group verbal instruction with posters, models, PowerPoint presentations and videos,  to review new updates, new respiratory medications, new advancements in procedures and treatments. Providing information on websites and 800 numbers for continued self-education. Includes information about supplement oxygen, available portable oxygen systems, continuous and intermittent flow rates, oxygen safety, concentrators, and Medicare reimbursement for oxygen. Explanation of Pulmonary Drugs, including class, frequency, complications, importance of spacers, rinsing mouth after steroid MDI's, and proper cleaning methods for nebulizers. Review of basic lung anatomy and physiology related to function, structure, and complications of lung disease. Review of risk factors. Discussion about methods for diagnosing sleep apnea and types of masks and machines for OSA. Includes a review of the use of types of environmental controls: home humidity, furnaces, filters, dust mite/pet prevention, HEPA vacuums. Discussion about weather changes, air quality and the benefits of nasal washing. Instruction on  Warning signs, infection symptoms, calling MD promptly, preventive modes, and value of vaccinations. Review of effective airway clearance, coughing and/or vibration techniques. Emphasizing that all should Create an Action Plan. Written material provided at class time. Flowsheet Row Pulmonary Rehab from 01/26/2024 in [facility] Cardiac and Pulmonary Rehab  Education need identified 01/26/24    AED/CPR: - Group verbal and written instruction with the use of models to demonstrate the basic use of the AED with the basic ABC's of resuscitation.    Tests and Procedures:  - Group verbal and visual presentation and models provide information about basic cardiac anatomy and function. Reviews the testing methods done to diagnose heart disease and the outcomes of the test results. Describes the treatment choices: Medical Management, Angioplasty, or Coronary Bypass Surgery for treating various heart conditions including Myocardial Infarction, Angina, Valve Disease, and Cardiac Arrhythmias.  Written material provided at class time.   Medication Safety: - Group verbal and visual instruction to review commonly prescribed medications for heart and lung disease. Reviews the medication, class of the drug, and side effects. Includes the steps to properly store meds and maintain the prescription regimen.  Written material given at graduation.   Other: -Provides group and verbal instruction on various topics (see comments)   Knowledge Questionnaire Score:  Knowledge Questionnaire Score - 06/19/24 0842       Knowledge Questionnaire Score   Pre Score 14/18           Core Components/Risk Factors/Patient Goals at Admission:  Personal Goals and Risk Factors at Admission - 05/30/24 1526       Core Components/Risk Factors/Patient Goals on Admission   Tobacco Cessation Yes    Number of packs per day <2 ppd    Intervention Assist the participant in steps to quit. Provide individualized education and counseling  about committing to Tobacco Cessation, relapse prevention, and pharmacological support that can be provided by physician.;Education officer, environmental, assist with locating and accessing local/national Quit Smoking programs, and support quit date choice.    Expected Outcomes Short Term: Will demonstrate readiness to quit, by selecting a quit date.;Short Term: Will quit all tobacco product use, adhering to prevention of relapse plan.;Long Term: Complete abstinence from all tobacco products for at least 12 months from quit date.    Improve shortness of breath with ADL's Yes    Intervention Provide education, individualized exercise plan and daily activity instruction to help decrease symptoms of SOB with activities of daily living.    Expected Outcomes Short Term: Improve cardiorespiratory fitness to achieve a reduction  of symptoms when performing ADLs;Long Term: Be able to perform more ADLs without symptoms or delay the onset of symptoms    Hypertension Yes    Intervention Provide education on lifestyle modifcations including regular physical activity/exercise, weight management, moderate sodium restriction and increased consumption of fresh fruit, vegetables, and low fat dairy, alcohol moderation, and smoking cessation.;Monitor prescription use compliance.    Expected Outcomes Short Term: Continued assessment and intervention until BP is < 140/67mm HG in hypertensive participants. < 130/1mm HG in hypertensive participants with diabetes, heart failure or chronic kidney disease.;Long Term: Maintenance of blood pressure at goal levels.    Lipids Yes    Intervention Provide education and support for participant on nutrition & aerobic/resistive exercise along with prescribed medications to achieve LDL 70mg , HDL >40mg .    Expected Outcomes Short Term: Participant states understanding of desired cholesterol values and is compliant with medications prescribed. Participant is following exercise prescription and  nutrition guidelines.;Long Term: Cholesterol controlled with medications as prescribed, with individualized exercise RX and with personalized nutrition plan. Value goals: LDL < 70mg , HDL > 40 mg.          Education:Diabetes - Individual verbal and written instruction to review signs/symptoms of diabetes, desired ranges of glucose level fasting, after meals and with exercise. Acknowledge that pre and post exercise glucose checks will be done for 3 sessions at entry of program.   Know Your Numbers and Heart Failure: - Group verbal and visual instruction to discuss disease risk factors for cardiac and pulmonary disease and treatment options.  Reviews associated critical values for Overweight/Obesity, Hypertension, Cholesterol, and Diabetes.  Discusses basics of heart failure: signs/symptoms and treatments.  Introduces Heart Failure Zone chart for action plan for heart failure. Written material provided at class time.   Core Components/Risk Factors/Patient Goals Review:   Goals and Risk Factor Review     Row Name 07/19/24 1554 08/30/24 1600           Core Components/Risk Factors/Patient Goals Review   Personal Goals Review Improve shortness of breath with ADL's Other;Tobacco Cessation      Review Spoke to patient about their shortness of breath and what they can do to improve. Patient has been informed of breathing techniques when starting the program. Patient is informed to tell staff if they have had any med changes and that certain meds they are taking or not taking can be causing shortness of breath. Patient states he does not want to do anymore treatments for his lungs such as radiation. He speaks with his doctor about his CT results soon. Informed him that exercising will benifit his overall health and to continue if he wants to. He has not quit smoking and does not plan to quit looking at his past notes.      Expected Outcomes Short: Attend [facility] regularly to improve shortness of  breath with ADL's. Long: maintain independence with ADL's Short: continue to exercise. Long: maintain exercise independently.         Core Components/Risk Factors/Patient Goals at Discharge (Final Review):   Goals and Risk Factor Review - 08/30/24 1600       Core Components/Risk Factors/Patient Goals Review   Personal Goals Review Other;Tobacco Cessation    Review Patient states he does not want to do anymore treatments for his lungs such as radiation. He speaks with his doctor about his CT results soon. Informed him that exercising will benifit his overall health and to continue if he wants to. He has not quit  smoking and does not plan to quit looking at his past notes.    Expected Outcomes Short: continue to exercise. Long: maintain exercise independently.          ITP Comments:  ITP Comments     Row Name 05/30/24 1516 06/11/24 1404 06/27/24 0945 07/25/24 1409 08/22/24 1039   ITP Comments Completed and gym orientation for pulmonary rehab. Initial ITP created and sent for review to Dr. Faud Aleskerov, Medical Director. First full day of exercise!  Patient was oriented to gym and equipment including functions, settings, policies, and procedures.  Patient's individual exercise prescription and treatment plan were reviewed.  All starting workloads were established based on the results of the 6 minute walk test done at initial orientation visit.  The plan for exercise progression was also introduced and progression will be customized based on patient's performance and goals. 30 Day review completed. Medical Director ITP review done; changes made as directed and signed approval by Medical Director. New to program. 30 Day review completed. Medical Director ITP review done; changes made as directed and signed approval by Medical Director. 30 Day review completed. Medical Director ITP review done; changes made as directed and signed approval by Medical Director.    Row Name 09/19/24 1249  10/17/24 0853         ITP Comments 30 Day review completed. Medical Director ITP review done, changes made as directed, and signed approval by Medical Director. 30 Day review completed. Medical Director ITP review done, changes made as directed, and signed approval by Medical Director.         Comments: 30 day review      [1]  Current Outpatient Medications:    acetaminophen  (TYLENOL ) 500 MG tablet, Take 500 mg by mouth daily as needed., Disp: , Rfl:    albuterol  (PROVENTIL ) (2.5 MG/3ML) 0.083% nebulizer solution, Take 3 mLs (2.5 mg total) by nebulization 4 (four) times daily as needed for wheezing or shortness of breath., Disp: 75 mL, Rfl: 11   albuterol  (VENTOLIN  HFA) 108 (90 Base) MCG/ACT inhaler, INHALE 2 PUFFS BY MOUTH EVERY 4 HOURS AS NEEDED FOR WHEEZE OR FOR SHORTNESS OF BREATH, Disp: 18 each, Rfl: 3   arformoterol  (BROVANA ) 15 MCG/2ML NEBU, Take 2 mLs (15 mcg total) by nebulization 2 (two) times daily., Disp: 120 mL, Rfl: 11   aspirin EC 81 MG tablet, Take 81 mg by mouth daily. Swallow whole., Disp: , Rfl:    atorvastatin  (LIPITOR) 10 MG tablet, Take 10 mg by mouth daily., Disp: , Rfl:    azithromycin  (ZITHROMAX ) 500 MG tablet, Take 500 mg by mouth., Disp: , Rfl:    budesonide  (PULMICORT ) 0.5 MG/2ML nebulizer solution, TAKE 2 ML (0.5 MG TOTAL) BY NEBULIZATION TWICE A DAY, Disp: 360 mL, Rfl: 3   chlorpheniramine-HYDROcodone  (TUSSIONEX) 10-8 MG/5ML, Take 5 mLs by mouth every 12 (twelve) hours as needed for cough. (Patient not taking: Reported on 10/04/2024), Disp: 115 mL, Rfl: 0   clotrimazole  (MYCELEX ) 10 MG troche, Take 10 mg by mouth 3 (three) times daily., Disp: , Rfl:    cyanocobalamin (VITAMIN B12) 1000 MCG/ML injection, Inject 1,000 mcg into the muscle., Disp: , Rfl:    diazepam  (VALIUM ) 5 MG tablet, Take 5 mg by mouth at bedtime as needed., Disp: , Rfl:    ferrous sulfate  325 (65 FE) MG tablet, Take 325 mg by mouth at bedtime., Disp: , Rfl:    Fluocinolone Acetonide 0.01 %  OIL, PLEASE SEE ATTACHED FOR DETAILED DIRECTIONS, Disp: ,  Rfl:    folic acid  (FOLVITE ) 1 MG tablet, Take 1 mg by mouth daily., Disp: , Rfl:    guaiFENesin  (MUCINEX ) 600 MG 12 hr tablet, Take 1 tablet (600 mg total) by mouth 2 (two) times daily., Disp: 60 tablet, Rfl: 0   hydrocortisone  (CORTEF ) 10 MG tablet, Take 2 tablets in the morning and 1 tablet mid afternoon (2 to 4 PM), Disp: 90 tablet, Rfl: 0   levothyroxine  (SYNTHROID ) 175 MCG tablet, Take 175 mcg by mouth daily before breakfast., Disp: , Rfl:    lidocaine -prilocaine  (EMLA ) cream, APPLY 1 APPLICATION TOPICALLY AS NEEDED., Disp: 30 g, Rfl: 1   magnesium  oxide (MAG-OX) 400 MG tablet, Take 800 mg by mouth 2 (two) times daily., Disp: , Rfl:    melatonin 3 MG TABS tablet, Take 3 mg by mouth at bedtime as needed. (Patient not taking: Reported on 10/04/2024), Disp: , Rfl:    methylPREDNISolone  (MEDROL  DOSEPAK) 4 MG TBPK tablet, Take as directed in the package, this is a taper pack., Disp: 21 tablet, Rfl: 0   naproxen  sodium (ANAPROX  DS) 550 MG tablet, Take 1 tablet (550 mg total) by mouth 2 (two) times daily as needed for moderate pain (pain score 4-6). Take with meals, Disp: 30 tablet, Rfl: 1   Nebulizers (COMPRESSOR/NEBULIZER) MISC, Use as directed for nebulized medications., Disp: 1 each, Rfl: 0   nicotine  (NICODERM CQ  - DOSED IN MG/24 HOURS) 21 mg/24hr patch, Place 21 mg onto the skin daily., Disp: , Rfl:    nitroGLYCERIN  (NITROSTAT ) 0.4 MG SL tablet, Place under the tongue., Disp: , Rfl:    OXYGEN, Inhale 2 L into the lungs at bedtime., Disp: , Rfl:    pantoprazole  (PROTONIX ) 40 MG tablet, Take 1 tablet (40 mg total) by mouth 2 (two) times daily before meals, Disp: , Rfl:    promethazine -dextromethorphan  (PROMETHAZINE -DM) 6.25-15 MG/5ML syrup, Take 5 mLs by mouth 4 (four) times daily as needed for cough., Disp: 180 mL, Rfl: 1   sodium chloride  1 g tablet, Take 5 tablets (5 g total) by mouth 3 (three) times daily with meals., Disp: , Rfl:     tadalafil  (CIALIS ) 5 MG tablet, Take 1 tablet (5 mg total) by mouth daily as needed for erectile dysfunction., Disp: 90 tablet, Rfl: 3   traMADol  (ULTRAM ) 50 MG tablet, Take 50 mg by mouth every 6 (six) hours as needed. (Patient not taking: Reported on 10/04/2024), Disp: , Rfl:    Vitamin D, Ergocalciferol, (DRISDOL) 1.25 MG (50000 UNIT) CAPS capsule, Take 50,000 Units by mouth once a week., Disp: , Rfl:    YUPELRI  175 MCG/3ML nebulizer solution, TAKE 3 MLS (175 MCG TOTAL) BY NEBULIZATION DAILY. DX: J44.9, Disp: 90 mL, Rfl: 11 No current facility-administered medications for this visit.  Facility-Administered Medications Ordered in Other Visits:    heparin  lock flush 100 unit/mL, 500 Units, Intravenous, Once, [name], Evalene PARAS, MD [2]  Social History Tobacco Use  Smoking Status Every Day   Current packs/day: 1.50   Average packs/day: 3.0 packs/day for 53.0 years (156.9 ttl pk-yrs)   Types: Cigarettes   Start date: 1973   Passive exposure: Current  Smokeless Tobacco Never

## 2024-10-18 ENCOUNTER — Encounter

## 2024-10-18 DIAGNOSIS — J449 Chronic obstructive pulmonary disease, unspecified: Secondary | ICD-10-CM

## 2024-10-18 NOTE — Telephone Encounter (Signed)
 Received fax from [facility] confirming receipt of Rx

## 2024-10-18 NOTE — Progress Notes (Signed)
 Daily Session Note  Patient Details  Name: Paul Carpenter MRN: [id number] Date of Birth: Jul 06, 1956 Referring Provider:   [facility] from 05/30/2024 in [facility] Card[facility] Pul[facility] Re[facility]  Referring Provider Dr. Dedra Sanders    Encounter Date: 10/18/2024  Check In:  Session Check In - 10/18/24 1543       Check-In   Supervising physician immediately available to respond to emergencies See telemetry face sheet for immediately available ER MD    Location [facility]-Cardiac & Pulmonary Rehab    Staff Present Hoy Rodney RN,BSN;Joseph Rolinda RCP,RRT,BSRT;Noah Tickle, [place], Exercise Physiologist;Maxon Conetta BS, Exercise Physiologist    Virtual Visit No    Medication changes reported     No    Fall or balance concerns reported    No    Tobacco Cessation No Change    Current number of cigarettes/nicotine  per day     12    Warm-up and Cool-down Performed on first and last piece of equipment    Resistance Training Performed Yes    VAD Patient? No    PAD/SET Patient? No      Pain Assessment   Currently in Pain? No/denies             Tobacco Use History[1]  Goals Met:  Independence with exercise equipment Exercise tolerated well No report of concerns or symptoms today Strength training completed today  Goals Unmet:  Not Applicable  Comments: Pt able to follow exercise prescription today without complaint.  Will continue to monitor for progression.    Dr. Oneil Pinal is Medical Director for [facility] Card[facility] Rehabilitation.  Dr. Fuad Aleskerov is Medical Director for [facility] Pulmonary Rehabilitation.    [1]  Social History Tobacco Use  Smoking Status Every Day   Current packs/day: 1.50   Average packs/day: 3.0 packs/day for 53.0 years (156.9 ttl pk-yrs)   Types: Cigarettes   Start date: 1973   Passive exposure: Current  Smokeless Tobacco Never

## 2024-10-22 ENCOUNTER — Encounter

## 2024-10-22 DIAGNOSIS — J449 Chronic obstructive pulmonary disease, unspecified: Secondary | ICD-10-CM

## 2024-10-22 NOTE — Progress Notes (Signed)
 Daily Session Note  Patient Details  Name: Paul Carpenter MRN: [id number] Date of Birth: 12-15-1955 Referring Provider:   [facility] from 05/30/2024 in [facility] Card[facility] and Pulmonary Rehab  Referring Provider Dr. Dedra Sanders    Encounter Date: 10/22/2024  Check In:  Session Check In - 10/22/24 1409       Check-In   Supervising physician immediately available to respond to emergencies See telemetry face sheet for immediately available ER MD    Location [facility]-Cardiac & Pulmonary Rehab    Staff Present Leita Franks RN,BSN;Noah Tickle, BS, Exercise Physiologist;Kristen Coble RN,BC,MSN;Margaret Best, MS, Exercise Physiologist    Virtual Visit No    Medication changes reported     No    Fall or balance concerns reported    No    Tobacco Cessation No Change    Warm-up and Cool-down Performed on first and last piece of equipment    Resistance Training Performed Yes    VAD Patient? No    PAD/SET Patient? No      Pain Assessment   Currently in Pain? No/denies             Tobacco Use History[1]  Goals Met:  Proper associated with RPD/PD & O2 Sat Independence with exercise equipment Using PLB without cueing & demonstrates good technique Exercise tolerated well No report of concerns or symptoms today Strength training completed today  Goals Unmet:  Not Applicable  Comments: Pt able to follow exercise prescription today without complaint.  Will continue to monitor for progression.    Dr. Oneil Pinal is Medical Director for [facility] Cardiac Rehabilitation.  Dr. Fuad Aleskerov is Medical Director for [facility].    [1]  Social History Tobacco Use  Smoking Status Every Day   Current packs/day: 1.50   Average packs/day: 3.0 packs/day for 53.0 years (157.0 ttl pk-yrs)   Types: Cigarettes   Start date: 1973   Passive exposure: Current  Smokeless Tobacco Never

## 2024-10-29 ENCOUNTER — Encounter

## 2024-11-05 ENCOUNTER — Encounter: Attending: Pulmonary Disease | Admitting: Emergency Medicine

## 2024-11-05 DIAGNOSIS — J449 Chronic obstructive pulmonary disease, unspecified: Secondary | ICD-10-CM | POA: Insufficient documentation

## 2024-11-05 NOTE — Progress Notes (Signed)
 Daily Session Note  Patient Details  Name: Paul Carpenter MRN: [id number] Date of Birth: August 12, 1956 Referring Provider:   [facility] from 05/30/2024 in [facility] Cardiac and Pulmonary Rehab  Referring Provider Dr. Dedra Sanders    Encounter Date: 11/05/2024  Check In:  Session Check In - 11/05/24 1357       Check-In   Supervising physician immediately available to respond to emergencies See telemetry face sheet for immediately available ER MD    Location [facility]-Cardiac & Pulmonary Rehab    Staff Present Leita Franks RN,BSN;Maxon Burnell BS, Exercise Physiologist;Margaret Best, MS, Exercise Physiologist;Noah Tickle, BS, Exercise Physiologist    Virtual Visit No    Medication changes reported     No    Fall or balance concerns reported    No    Tobacco Cessation No Change    Warm-up and Cool-down Performed on first and last piece of equipment    Resistance Training Performed Yes    VAD Patient? No    PAD/SET Patient? No      Pain Assessment   Currently in Pain? No/denies             Tobacco Use History[1]  Goals Met:  Proper associated with RPD/PD & O2 Sat Independence with exercise equipment Using PLB without cueing & demonstrates good technique Exercise tolerated well No report of concerns or symptoms today Strength training completed today  Goals Unmet:  Not Applicable  Comments: Pt able to follow exercise prescription today without complaint.  Will continue to monitor for progression.    Dr. Oneil Pinal is Medical Director for [facility] Cardiac Rehabilitation.  Dr. Fuad Aleskerov is Medical Director for [facility] Pulmonary Rehabilitation.    [1]  Social History Tobacco Use  Smoking Status Every Day   Current packs/day: 1.50   Average packs/day: 3.0 packs/day for 53.0 years (157.0 ttl pk-yrs)   Types: Cigarettes   Start date: 1973   Passive exposure: Current  Smokeless Tobacco Never

## 2024-11-08 ENCOUNTER — Encounter

## 2024-11-12 ENCOUNTER — Encounter

## 2024-11-14 ENCOUNTER — Encounter: Payer: Self-pay | Admitting: *Deleted

## 2024-11-14 DIAGNOSIS — J449 Chronic obstructive pulmonary disease, unspecified: Secondary | ICD-10-CM

## 2024-11-14 NOTE — Progress Notes (Signed)
 Pulmonary Individual Treatment Plan  Patient Details  Name: SARKIS RHINES MRN: [id number] Date of Birth: September 12, 1956 Referring Provider:   Conrad Ports Pulmonary Rehab from 05/30/2024 in [facility] Card[facility] and Pulmonary Rehab  Referring Provider Dr. Dedra Sanders    Initial Encounter Date:  Flowsheet Row Pulmonary Rehab from 05/30/2024 in [facility] Cardiac and Pulmonary Rehab  Date 05/30/24    Visit Diagnosis: Chronic obstructive pulmonary disease, unspecified COPD type (HCC)  Patient's Home Medications on Admission: Current Medications[1]  Past Medical History: Past Medical History:  Diagnosis Date   Anginal pain    Anxiety    Asthma    Chest pain    CHF (congestive heart failure) (HCC)    Chicken pox    Complication of anesthesia    o2 dropped after neck fusion   COPD (chronic obstructive pulmonary disease) (HCC)    Coronary artery disease    Cough    chronic  clear phlegm   Dysrhythmia    palpitations   GERD (gastroesophageal reflux disease)    h/o reflux/ hoarsness   Hematochezia    Hemorrhoids    History of chickenpox    History of colon polyps    History of Helicobacter pylori infection    Hoarseness    Hypertension    Lung cancer (HCC) 05/2016   Chemo + rad tx's.    Migraines    OSA (obstructive sleep apnea)    has CPAP but does not use   Personal history of tobacco use, presenting hazards to health 03/05/2016   Pneumonia    5/17   Raynaud disease    Raynaud disease    Raynaud's disease    Rotator cuff tear    on right   Shortness of breath dyspnea    Sleep apnea    Ulcer (traumatic) of oral mucosa     Tobacco Use: Tobacco Use History[2]  Labs: Review Flowsheet        No data to display           Pulmonary Assessment Scores:  Pulmonary Assessment Scores     Row Name 05/30/24 1525 06/11/24 1717       ADL UCSD   ADL Phase -- Entry    SOB Score total -- 89    Rest -- 3    Walk -- 3    Stairs -- 5    Bath -- 4    Dress -- 2    Shop  -- 5      CAT Score   CAT Score 34 --      mMRC Score   mMRC Score 3 --       UCSD: Self-administered rating of dyspnea associated with activities of daily living (ADLs) 6-point scale (0 = not at all to 5 = maximal or unable to do because of breathlessness)  Scoring Scores range from 0 to 120.  Minimally important difference is 5 units  CAT: CAT can identify the health impairment of COPD patients and is better correlated with disease progression.  CAT has a scoring range of zero to 40. The CAT score is classified into four groups of low (less than 10), medium (10 - 20), high (21-30) and very high (31-40) based on the impact level of disease on health status. A CAT score over 10 suggests significant symptoms.  A worsening CAT score could be explained by an exacerbation, poor medication adherence, poor inhaler technique, or progression of COPD or comorbid conditions.  CAT MCID is 2 points  mMRC: mMRC (Modified Medical Research Council) Dyspnea Scale is used to assess the degree of baseline functional disability in patients of respiratory disease due to dyspnea. No minimal important difference is established. A decrease in score of 1 point or greater is considered a positive change.   Pulmonary Function Assessment:   Exercise Target Goals: Exercise Program Goal: Individual exercise prescription set using results from initial 6 min walk test and THRR while considering  patients activity barriers and safety.   Exercise Prescription Goal: Initial exercise prescription builds to 30-45 minutes a day of aerobic activity, 2-3 days per week.  Home exercise guidelines will be given to patient during program as part of exercise prescription that the participant will acknowledge.  Education: Aerobic Exercise: - Group verbal and visual presentation on the components of exercise prescription. Introduces F.I.T.T principle from ACSM for exercise prescriptions.  Reviews F.I.T.T. principles of  aerobic exercise including progression. Written material provided at class time.   Education: Resistance Exercise: - Group verbal and visual presentation on the components of exercise prescription. Introduces F.I.T.T principle from ACSM for exercise prescriptions  Reviews F.I.T.T. principles of resistance exercise including progression. Written material provided at class time.    Education: Exercise & Equipment Safety: - Individual verbal instruction and demonstration of equipment use and safety with use of the equipment. Flowsheet Row Pulmonary Rehab from 05/30/2024 in [facility] Card[facility] Pulmonary Rehab  Date 05/30/24  Educator [facility]  Instruction Review Code 1- Verbalizes Understanding    Education: Exercise Physiology & General Exercise Guidelines: - Group verbal and written instruction with models to review the exercise physiology of the cardiovascular system and associated critical values. Provides general exercise guidelines with specific guidelines to those with heart or lung disease.    Education: Flexibility, Balance, Mind/Body Relaxation: - Group verbal and visual presentation with interactive activity on the components of exercise prescription. Introduces F.I.T.T principle from ACSM for exercise prescriptions. Reviews F.I.T.T. principles of flexibility and balance exercise training including progression. Also discusses the mind body connection.  Reviews various relaxation techniques to help reduce and manage stress (i.e. Deep breathing, progressive muscle relaxation, and visualization). Balance handout provided to take home. Written material provided at class time.   Activity Barriers & Risk Stratification:  Activity Barriers & Cardiac Risk Stratification - 05/30/24 1519       Activity Barriers & Cardiac Risk Stratification   Activity Barriers Deconditioning;Muscular Weakness;Shortness of Breath;Other (comment)    Comments Shoulder mobility concerns          6 Minute Walk:  6  Minute Walk     Row Name 05/30/24 1516         6 Minute Walk   Phase Initial     Distance 670 feet     Walk Time 6 minutes     # of Rest Breaks 0     MPH 1.27     METS 2.43     RPE 13     Perceived Dyspnea  3     VO2 Peak 8.52     Symptoms Yes (comment)     Comments dizziness, SOB     Resting HR 88 bpm     Resting BP 114/62     Resting Oxygen Saturation  93 %     Exercise Oxygen Saturation  during 6 min walk 89 %     Max Ex. HR 99 bpm     Max Ex. BP 134/64     2 Minute Post BP 126/68  Interval HR   1 Minute HR 99     2 Minute HR 94     3 Minute HR 91     4 Minute HR 95     5 Minute HR 97     6 Minute HR 99     2 Minute Post HR 87     Interval Heart Rate? Yes       Interval Oxygen   Interval Oxygen? Yes     Baseline Oxygen Saturation % 93 %     1 Minute Oxygen Saturation % 90 %     1 Minute Liters of Oxygen 0 L     2 Minute Oxygen Saturation % 93 %     2 Minute Liters of Oxygen 0 L     3 Minute Oxygen Saturation % 88 %     3 Minute Liters of Oxygen 0 L     4 Minute Oxygen Saturation % 89 %     4 Minute Liters of Oxygen 0 L     5 Minute Oxygen Saturation % 90 %     5 Minute Liters of Oxygen 0 L     6 Minute Oxygen Saturation % 93 %     6 Minute Liters of Oxygen 0 L     2 Minute Post Oxygen Saturation % 95 %     2 Minute Post Liters of Oxygen 0 L       Oxygen Initial Assessment:  Oxygen Initial Assessment - 05/30/24 1524       Home Oxygen   Sleep Oxygen Prescription Continuous    Liters per minute 3    Home Exercise Oxygen Prescription Continuous    Liters per minute 3    Home Resting Oxygen Prescription None    Compliance with Home Oxygen Use Yes      Initial 6 min Walk   Oxygen Used None      Program Oxygen Prescription   Program Oxygen Prescription None      Intervention   Short Term Goals To learn and demonstrate proper pursed lip breathing techniques or other breathing techniques.     Long  Term Goals Exhibits proper breathing  techniques, such as pursed lip breathing or other method taught during program session          Oxygen Re-Evaluation:  Oxygen Re-Evaluation     Row Name 06/11/24 1413 07/19/24 1552 08/30/24 1607 11/05/24 1455       Program Oxygen Prescription   Program Oxygen Prescription -- None None None      Home Oxygen   Home Oxygen Device -- Home Concentrator Home Concentrator Home Concentrator    Sleep Oxygen Prescription -- Continuous Continuous Continuous    Liters per minute -- 2 2 2     Home Exercise Oxygen Prescription -- None None None    Liters per minute -- --  prn --  prn --  prn    Home Resting Oxygen Prescription -- None None None    Compliance with Home Oxygen Use -- Yes Yes Yes      Goals/Expected Outcomes   Short Term Goals -- To learn and demonstrate proper pursed lip breathing techniques or other breathing techniques.  To learn and demonstrate proper use of respiratory medications To learn and understand importance of monitoring SPO2 with pulse oximeter and demonstrate accurate use of the pulse oximeter.;To learn and understand importance of maintaining oxygen saturations>88%;To learn and demonstrate proper pursed lip breathing techniques or other breathing techniques.  Long  Term Goals -- Exhibits proper breathing techniques, such as pursed lip breathing or other method taught during program session Compliance with respiratory medication Maintenance of O2 saturations>88%;Verbalizes importance of monitoring SPO2 with pulse oximeter and return demonstration;Compliance with respiratory medication;Exhibits proper breathing techniques, such as pursed lip breathing or other method taught during program session    Comments Reviewed PLB technique with pt.  Talked about how it works and it's importance in maintaining their exercise saturations. Informed patient how to perform the Pursed Lipped breathing technique. Told patient to Inhale through the nose and out the mouth with pursed lips to  keep their airways open, help oxygenate them better, practice when at rest or doing strenuous activity. Patient Verbalizes understanding of technique and will work on and be reiterated during [facility]. Velinda takes his breathing treatments as needed. He uses oxygen at night and monitors his oxygen level. He knows to keep his oxygen above 88 percent. Tim uses his oxygen at night as needed appropriately. He continues to check his oxygen saturation and practices his pursed lip breathing as needed.    Goals/Expected Outcomes Short: Become more profiecient at using PLB.   Long: Become independent at using PLB. Short: use PLB with exertion. Long: use PLB on exertion proficiently and independently. Short: continue to exercise to improve lung function. Long: maintain exercise post [facility]. Short: attend pulmonary rehab for shortness of breath management Long: Maintain oxygen saturation management.       Oxygen Discharge (Final Oxygen Re-Evaluation):  Oxygen Re-Evaluation - 11/05/24 1455       Program Oxygen Prescription   Program Oxygen Prescription None      Home Oxygen   Home Oxygen Device Home Concentrator    Sleep Oxygen Prescription Continuous    Liters per minute 2    Home Exercise Oxygen Prescription None    Liters per minute --   prn   Home Resting Oxygen Prescription None    Compliance with Home Oxygen Use Yes      Goals/Expected Outcomes   Short Term Goals To learn and understand importance of monitoring SPO2 with pulse oximeter and demonstrate accurate use of the pulse oximeter.;To learn and understand importance of maintaining oxygen saturations>88%;To learn and demonstrate proper pursed lip breathing techniques or other breathing techniques.     Long  Term Goals Maintenance of O2 saturations>88%;Verbalizes importance of monitoring SPO2 with pulse oximeter and return demonstration;Compliance with respiratory medication;Exhibits proper breathing techniques, such as pursed lip breathing or  other method taught during program session    Comments Tim uses his oxygen at night as needed appropriately. He continues to check his oxygen saturation and practices his pursed lip breathing as needed.    Goals/Expected Outcomes Short: attend pulmonary rehab for shortness of breath management Long: Maintain oxygen saturation management.          Initial Exercise Prescription:  Initial Exercise Prescription - 05/30/24 1500       Date of Initial Exercise RX and Referring Provider   Date 05/30/24    Referring Provider Dr. Dedra Sanders      Oxygen   Oxygen Continuous    Liters 0-2L    Maintain Oxygen Saturation 88% or higher      Recumbant Bike   Level 1    RPM 50    Watts 25    Minutes 15    METs 2.43      NuStep   Level 1    SPM 80    Minutes 15  METs 2.43      REL-XR   Level 1    Watts 25    Speed 50    Minutes 15    METs 2.43      Track   Laps 18    Minutes 15    METs 1.98      Intensity   THRR 40-80% of Max Heartrate 114-140    Ratings of Perceived Exertion 11-13    Perceived Dyspnea 0-4      Resistance Training   Training Prescription Yes    Weight 3lb    Reps 10-15          Perform Capillary Blood Glucose checks as needed.  Exercise Prescription Changes:   Exercise Prescription Changes     Row Name 05/30/24 1500 06/20/24 1100 07/04/24 1500 07/19/24 0800 08/02/24 1600     Response to Exercise   Blood Pressure (Admit) 114/62 134/70 122/60 128/76 110/62   Blood Pressure (Exercise) 134/64 154/82 142/82 122/62 142/60   Blood Pressure (Exit) 126/68 116/64 136/78 126/62 128/72   Heart Rate (Admit) 88 bpm 92 bpm 92 bpm 103 bpm 100 bpm   Heart Rate (Exercise) 99 bpm 101 bpm 113 bpm 90 bpm 106 bpm   Heart Rate (Exit) 81 bpm 88 bpm 98 bpm 85 bpm 93 bpm   Oxygen Saturation (Admit) 93 % 94 % 93 % 96 % 96 %   Oxygen Saturation (Exercise) 89 % 94 % 89 % 93 % 93 %   Oxygen Saturation (Exit) 95 % 96 % 94 % 96 % 95 %   Rating of Perceived Exertion  (Exercise) 13 13 15 11 13    Perceived Dyspnea (Exercise) 3 3 3 3 3    Symptoms Dizziness, SOB none none none none   Comments results first 2 weeks of exercise -- -- --   Duration -- Progress to 30 minutes of  aerobic without signs/symptoms of physical distress Progress to 30 minutes of  aerobic without signs/symptoms of physical distress Progress to 30 minutes of  aerobic without signs/symptoms of physical distress Progress to 30 minutes of  aerobic without signs/symptoms of physical distress   Intensity -- THRR unchanged THRR unchanged THRR unchanged THRR unchanged     Progression   Progression -- Continue to progress workloads to maintain intensity without signs/symptoms of physical distress. Continue to progress workloads to maintain intensity without signs/symptoms of physical distress. Continue to progress workloads to maintain intensity without signs/symptoms of physical distress. Continue to progress workloads to maintain intensity without signs/symptoms of physical distress.   Average METs -- 1.37 1.72 1.2 1.21     Resistance Training   Training Prescription -- Yes Yes Yes Yes   Weight -- 3lb 3lb 3lb 3lb   Reps -- 10-15 10-15 10-15 10-15     Interval Training   Interval Training -- No No No No     Oxygen   Oxygen -- Continuous -- -- --   Liters -- 0 -- -- --     Treadmill   MPH -- -- -- -- 0.7   Grade -- -- -- -- 0   Minutes -- -- -- -- 15   METs -- -- -- -- 1.54     Recumbant Bike   Level -- -- 1 -- --   Watts -- -- 25 -- --   Minutes -- -- 15 -- --   METs -- -- 3.09 -- --     NuStep   Level -- 2 3 1  2  Minutes -- 15 15 30 30    METs -- 1.5 1.1 1.2 1.3     REL-XR   Level -- -- 1 -- 1   Minutes -- -- 15 -- 15   METs -- -- -- -- 1     Biostep-RELP   Level -- 1 -- -- --   Minutes -- 15 -- -- --   METs -- 1 -- -- --     Track   Laps -- 9 -- -- --   Minutes -- 15 -- -- --   METs -- 1.49 -- -- --     Oxygen   Maintain Oxygen Saturation -- 88% or higher  88% or higher 88% or higher 88% or higher    Row Name 08/15/24 1100 08/27/24 1100 09/12/24 1100 09/24/24 1100 09/24/24 1400     Response to Exercise   Blood Pressure (Admit) 162/84 124/72 140/72 130/80 --   Blood Pressure (Exit) 132/72 140/76 128/72 126/82 --   Heart Rate (Admit) 94 bpm 100 bpm 95 bpm 96 bpm --   Heart Rate (Exercise) 100 bpm 102 bpm 99 bpm 99 bpm --   Heart Rate (Exit) 88 bpm 94 bpm 92 bpm 92 bpm --   Oxygen Saturation (Admit) 95 % 95 % 93 % 90 % --   Oxygen Saturation (Exercise) 94 % 94 % 89 % 91 % --   Oxygen Saturation (Exit) 98 % 94 % 96 % 90 % --   Rating of Perceived Exertion (Exercise) 13 14 15 12  --   Perceived Dyspnea (Exercise) 3 -- 3 1 --   Symptoms none none none none --   Duration Progress to 30 minutes of  aerobic without signs/symptoms of physical distress Progress to 30 minutes of  aerobic without signs/symptoms of physical distress Continue with 30 min of aerobic exercise without signs/symptoms of physical distress. Continue with 30 min of aerobic exercise without signs/symptoms of physical distress. Continue with 30 min of aerobic exercise without signs/symptoms of physical distress.   Intensity THRR unchanged THRR unchanged THRR unchanged THRR unchanged THRR unchanged     Progression   Progression Continue to progress workloads to maintain intensity without signs/symptoms of physical distress. Continue to progress workloads to maintain intensity without signs/symptoms of physical distress. Continue to progress workloads to maintain intensity without signs/symptoms of physical distress. Continue to progress workloads to maintain intensity without signs/symptoms of physical distress. Continue to progress workloads to maintain intensity without signs/symptoms of physical distress.   Average METs 1.45 1.4 1.4 1.4 1.4     Resistance Training   Training Prescription Yes Yes Yes Yes Yes   Weight 3 lb 3 lb 3 lb 3 lb 3 lb   Reps 10-15 10-15 10-15 10-15 10-15      Interval Training   Interval Training No No No No No     NuStep   Level 2 3 2 2 2    Minutes 30 30 30 30 30    METs 1.7 -- 1.5 1.4 1.4     T5 Nustep   Level -- 1 -- -- --   Minutes -- 15 -- -- --   METs -- 1.4 -- -- --     Home Exercise Plan   Plans to continue exercise at -- -- -- -- Home (comment)  walk and peddle machine   Frequency -- -- -- -- Add 2 additional days to program exercise sessions.   Initial Home Exercises Provided -- -- -- -- 09/24/24  Oxygen   Maintain Oxygen Saturation 88% or higher 88% or higher 88% or higher 88% or higher 88% or higher    Row Name 10/10/24 0700 10/30/24 1500 11/14/24 0800         Response to Exercise   Blood Pressure (Admit) 132/70 120/62 122/70     Blood Pressure (Exit) 118/64 124/72 124/70     Heart Rate (Admit) 104 bpm 96 bpm 103 bpm     Heart Rate (Exercise) 113 bpm 106 bpm 110 bpm     Heart Rate (Exit) 104 bpm 94 bpm 104 bpm     Oxygen Saturation (Admit) 90 % 90 % 95 %     Oxygen Saturation (Exercise) 92 % 90 % 93 %     Oxygen Saturation (Exit) 96 % 92 % 91 %     Rating of Perceived Exertion (Exercise) 13 15 15      Perceived Dyspnea (Exercise) 2 3 0     Symptoms none none none     Duration Continue with 30 min of aerobic exercise without signs/symptoms of physical distress. Continue with 30 min of aerobic exercise without signs/symptoms of physical distress. Continue with 30 min of aerobic exercise without signs/symptoms of physical distress.     Intensity THRR unchanged THRR unchanged THRR unchanged       Progression   Progression Continue to progress workloads to maintain intensity without signs/symptoms of physical distress. Continue to progress workloads to maintain intensity without signs/symptoms of physical distress. Continue to progress workloads to maintain intensity without signs/symptoms of physical distress.     Average METs 1 1.63 3.07       Resistance Training   Training Prescription Yes -- --     Weight 3 lb  3 lb 3 lb     Reps 10-15 10-15 10-15       Interval Training   Interval Training No No No       Recumbant Bike   Level -- -- 1     Watts -- -- 25     Minutes -- -- 15     METs -- -- 3.07       NuStep   Level -- 2 --     Minutes -- 30 --     METs -- 2 --       REL-XR   Level 1 1 --     Minutes 15 15 --     METs 1 -- --       T5 Nustep   Level -- -- 1     Minutes -- -- 15       Track   Laps -- 9 --     Minutes -- 15 --     METs -- 1.49 --       Home Exercise Plan   Plans to continue exercise at Home (comment)  walk and peddle machine Home (comment)  walk and peddle machine Home (comment)  walk and peddle machine     Frequency Add 2 additional days to program exercise sessions. Add 2 additional days to program exercise sessions. Add 2 additional days to program exercise sessions.     Initial Home Exercises Provided 09/24/24 09/24/24 09/24/24       Oxygen   Maintain Oxygen Saturation 88% or higher 88% or higher 88% or higher        Exercise Comments:   Exercise Comments     Row Name 06/11/24 1404  Exercise Comments First full day of exercise!  Patient was oriented to gym and equipment including functions, settings, policies, and procedures.  Patient's individual exercise prescription and treatment plan were reviewed.  All starting workloads were established based on the results of the 6 minute walk test done at initial orientation visit.  The plan for exercise progression was also introduced and progression will be customized based on patient's performance and goals.          Exercise Goals and Review:   Exercise Goals     Row Name 05/30/24 1523             Exercise Goals   Increase Physical Activity Yes       Intervention Provide advice, education, support and counseling about physical activity/exercise needs.;Develop an individualized exercise prescription for aerobic and resistive training based on initial evaluation findings, risk  stratification, comorbidities and participant's personal goals.       Expected Outcomes Short Term: Attend rehab on a regular basis to increase amount of physical activity.;Long Term: Exercising regularly at least 3-5 days a week.;Long Term: Add in home exercise to make exercise part of routine and to increase amount of physical activity.       Increase Strength and Stamina Yes       Intervention Provide advice, education, support and counseling about physical activity/exercise needs.;Develop an individualized exercise prescription for aerobic and resistive training based on initial evaluation findings, risk stratification, comorbidities and participant's personal goals.       Expected Outcomes Short Term: Increase workloads from initial exercise prescription for resistance, speed, and METs.;Short Term: Perform resistance training exercises routinely during rehab and add in resistance training at home;Long Term: Improve cardiorespiratory fitness, muscular endurance and strength as measured by increased METs and functional capacity ( )       Able to understand and use rate of perceived exertion (RPE) scale Yes       Intervention Provide education and explanation on how to use RPE scale       Expected Outcomes Short Term: Able to use RPE daily in rehab to express subjective intensity level;Long Term:  Able to use RPE to guide intensity level when exercising independently       Able to understand and use Dyspnea scale Yes       Intervention Provide education and explanation on how to use Dyspnea scale       Expected Outcomes Short Term: Able to use Dyspnea scale daily in rehab to express subjective sense of shortness of breath during exertion;Long Term: Able to use Dyspnea scale to guide intensity level when exercising independently       Knowledge and understanding of Target Heart Rate Range (THRR) Yes       Intervention Provide education and explanation of THRR including how the numbers were predicted  and where they are located for reference       Expected Outcomes Short Term: Able to state/look up THRR;Short Term: Able to use daily as guideline for intensity in rehab;Long Term: Able to use THRR to govern intensity when exercising independently       Able to check pulse independently Yes       Intervention Provide education and demonstration on how to check pulse in carotid and radial arteries.;Review the importance of being able to check your own pulse for safety during independent exercise       Expected Outcomes Long Term: Able to check pulse independently and accurately;Short Term: Able to explain why pulse  checking is important during independent exercise       Understanding of Exercise Prescription Yes       Intervention Provide education, explanation, and written materials on patient's individual exercise prescription       Expected Outcomes Short Term: Able to explain program exercise prescription;Long Term: Able to explain home exercise prescription to exercise independently          Exercise Goals Re-Evaluation :  Exercise Goals Re-Evaluation     Row Name 06/11/24 1405 06/11/24 1407 06/20/24 1136 07/04/24 1519 07/19/24 0825     Exercise Goal Re-Evaluation   Exercise Goals Review Increase Physical Activity;Able to understand and use rate of perceived exertion (RPE) scale;Knowledge and understanding of Target Heart Rate Range (THRR);Understanding of Exercise Prescription;Increase Strength and Stamina;Able to understand and use Dyspnea scale;Able to check pulse independently Increase Physical Activity;Able to understand and use rate of perceived exertion (RPE) scale;Knowledge and understanding of Target Heart Rate Range (THRR);Understanding of Exercise Prescription;Increase Strength and Stamina;Able to understand and use Dyspnea scale;Able to check pulse independently Increase Physical Activity;Increase Strength and Stamina;Understanding of Exercise Prescription Increase Physical  Activity;Increase Strength and Stamina;Understanding of Exercise Prescription Increase Physical Activity;Increase Strength and Stamina;Understanding of Exercise Prescription   Comments Reviewed RPE and dyspnea scale, THR and program prescription with pt today.  Pt voiced understanding and was given a copy of goals to take home. -- Velinda is off to a good start in the program. He has been able to attend his first 2 sessions during this review period. During his sessions he was able to walk 9 laps on the track, and use the T4 nustep at level 2. We will continue to monitor his progress in the program. Velinda is off to a good start in the program. He increased to level 3 on the T4 nustep. He worked at level 1 on the recumbent bike and XR. We will continue to monitor his progress in the program. Velinda is is doing well in rehab and was only able to attend one session in this review. We will encourage him to be more consistent with rehab for exercise benefits. He decreased down to level 1 on the nustep in this session for 30 minutes. We will continue to monitor his progress in the program.   Expected Outcomes Short: Use RPE daily to regulate intensity.  Long: Follow program prescription in THR. -- Short: Continue to follow exercise prescription. Long: Continue exercise to improve strength and stamina. Short: Continue to progressively increase T4 nustep workload. Long: Continue exercise to improve strength and stamina. Short: Be more consistent with rehab. Long: Continue exercise to improve strength and stamina.    Row Name 08/02/24 1656 08/15/24 1133 08/27/24 1147 09/12/24 1103 09/24/24 1120     Exercise Goal Re-Evaluation   Exercise Goals Review Increase Physical Activity;Increase Strength and Stamina;Understanding of Exercise Prescription Increase Physical Activity;Increase Strength and Stamina;Understanding of Exercise Prescription Increase Physical Activity;Increase Strength and Stamina;Understanding of Exercise  Prescription Increase Physical Activity;Increase Strength and Stamina;Understanding of Exercise Prescription Increase Physical Activity;Increase Strength and Stamina;Understanding of Exercise Prescription   Comments Velinda is is doing well in rehab. He increased to level 2 on the T4 nustep. He maintained level 1 on the XR. He tried the treadmill and was able to do a speed of 0.32mph and no incline. We will continue to monitor his progress in the program. Velinda is is doing well in rehab. He has only worked on the T4 nustep since the last review. He continues to do  well with 3 lb handweights for resistance training. We will continue to monitor his progress in the program. Velinda continues to do well in rehab. He was only able to attend 2 sessions during this review period, but was able to increase from level 2 to 3 on the T4 nustep. We will continue to monitor his progress in the program. Velinda has only attended two sessions since the last review. He continues to work on the T4 nustep at level 2 for 30 minutes. We will continue to monitor his progress in the program. Velinda has only attended one session during this review period. During his one session he was able to use the T4 nustep at level 2 for 30 minutes. We will continue to monitor his progress in the program.   Expected Outcomes Short: Continue to progressively increase XR and nustep workloads. Long: Continue exercise to improve strength and stamina. Short: Use other aerobic machines for exercise. Long: Continue exercise to improve strength and stamina. Short: Attend rehab more frequently. Long: Continue exercise to improve strength and stamina. Short: Attend rehab more consistently. Long: Continue exercise to improve strength and stamina. Short: Attend rehab more consistently. Long: Continue exercise to improve strength and stamina.    Row Name 09/24/24 1426 10/10/24 0743 10/30/24 1514 11/05/24 1511 11/14/24 0855     Exercise Goal Re-Evaluation   Exercise Goals  Review Increase Physical Activity;Able to understand and use rate of perceived exertion (RPE) scale;Knowledge and understanding of Target Heart Rate Range (THRR);Understanding of Exercise Prescription;Increase Strength and Stamina;Able to understand and use Dyspnea scale;Able to check pulse independently Increase Physical Activity;Increase Strength and Stamina;Understanding of Exercise Prescription Increase Physical Activity;Increase Strength and Stamina;Understanding of Exercise Prescription Able to understand and use Dyspnea scale;Knowledge and understanding of Target Heart Rate Range (THRR);Understanding of Exercise Prescription;Able to understand and use rate of perceived exertion (RPE) scale Increase Physical Activity;Understanding of Exercise Prescription;Increase Strength and Stamina   Comments Reviewed home exercise with pt today from 2:05pm to 2:15pm.  Pt plans to walk and use a peddle machine at home for exercise.  Reviewed THR, pulse, RPE, sign and symptoms, pulse oximetery and when to call 911 or MD.  Also discussed weather considerations and indoor options.  Pt voiced understanding. Velinda is doing well in rehab. He was only able to attend 2 sessions during this review period, and was only able to use the XR during those sessions. We will continue to monitor his progress in the program. Velinda is doing well in rehab. He continues to work at level 2 on the T4 nustep. He also recently walked 9 laps on the track. We will continue to monitor his progress in the program. Velinda is aware of his home exercise plan. He uses his peddle machine at home some when his energy allows. He has been busy with the holiday season and plans to get back into consistenly adding the machine on his off days from the program. He tries to stay moving to help with his mood and stamina Velinda is doing well in rehab. He attended 1 session in this review period with the holidays. He maintained level 1 on the recumbent bike and T5 nustep. We  will continue to monitor his progress in the program.   Expected Outcomes Short: add 1-2 days a week of exercise at home on off days of rehab. Long: maintain independent exercise routine upon graduation from pulmonary rehab. Short: Attend rehab more frequently. Long: Continue exercise to improve strength and stamina. Short: Increase laps  walked on the track. Long: Continue exercise to improve strength and stamina. Short: add one additional day of exercise at home. Long: independently manage exercise routine. Short: Try level 2 on the T5 nustep. Long: Continue exercise to improve strength and stamina.      Discharge Exercise Prescription (Final Exercise Prescription Changes):  Exercise Prescription Changes - 11/14/24 0800       Response to Exercise   Blood Pressure (Admit) 122/70    Blood Pressure (Exit) 124/70    Heart Rate (Admit) 103 bpm    Heart Rate (Exercise) 110 bpm    Heart Rate (Exit) 104 bpm    Oxygen Saturation (Admit) 95 %    Oxygen Saturation (Exercise) 93 %    Oxygen Saturation (Exit) 91 %    Rating of Perceived Exertion (Exercise) 15    Perceived Dyspnea (Exercise) 0    Symptoms none    Duration Continue with 30 min of aerobic exercise without signs/symptoms of physical distress.    Intensity THRR unchanged      Progression   Progression Continue to progress workloads to maintain intensity without signs/symptoms of physical distress.    Average METs 3.07      Resistance Training   Weight 3 lb    Reps 10-15      Interval Training   Interval Training No      Recumbant Bike   Level 1    Watts 25    Minutes 15    METs 3.07      T5 Nustep   Level 1    Minutes 15      Home Exercise Plan   Plans to continue exercise at Home (comment)   walk and peddle machine   Frequency Add 2 additional days to program exercise sessions.    Initial Home Exercises Provided 09/24/24      Oxygen   Maintain Oxygen Saturation 88% or higher          Nutrition:  Target  Goals: Understanding of nutrition guidelines, daily intake of sodium 1500mg , cholesterol 200mg , calories 30% from fat and 7% or less from saturated fats, daily to have 5 or more servings of fruits and vegetables.  Education: Nutrition 1 -Group instruction provided by verbal, written material, interactive activities, discussions, models, and posters to present general guidelines for heart healthy nutrition including macronutrients, label reading, and promoting whole foods over processed counterparts. Education serves as pensions consultant of discussion of heart healthy eating for all. Written material provided at class time.     Education: Nutrition 2 -Group instruction provided by verbal, written material, interactive activities, discussions, models, and posters to present general guidelines for heart healthy nutrition including sodium, cholesterol, and saturated fat. Providing guidance of habit forming to improve blood pressure, cholesterol, and body weight. Written material provided at class time.     Biometrics:  Pre Biometrics - 05/30/24 1524       Pre Biometrics   Height 5' 11 (1.803 m)    Weight 159 lb 14.4 oz (72.5 kg)    Waist Circumference 34 inches    Hip Circumference 36.5 inches    Waist to Hip Ratio 0.93 %    BMI (Calculated) 22.31    Single Leg Stand 0 seconds           Nutrition Therapy Plan and Nutrition Goals:  Nutrition Therapy & Goals - 11/05/24 1452       Nutrition Therapy   RD appointment deferred Yes  Nutrition Assessments:  MEDIFICTS Score Key: >=70 Need to make dietary changes  40-70 Heart Healthy Diet <= 40 Therapeutic Level Cholesterol Diet  Flowsheet Row Pulmonary Rehab from 01/18/2024 in [facility]  Picture Your Plate Total Score on Admission 39   Picture Your Plate Scores: <59 Unhealthy dietary pattern with much room for improvement. 41-50 Dietary pattern unlikely to meet recommendations for good health and  room for improvement. 51-60 More healthful dietary pattern, with some room for improvement.  >60 Healthy dietary pattern, although there may be some specific behaviors that could be improved.   Nutrition Goals Re-Evaluation:  Nutrition Goals Re-Evaluation     Row Name 07/19/24 1555 08/30/24 1607 11/05/24 1452         Goals   Comment Patient was informed on why it is important to maintain a balanced diet when dealing with Respiratory issues. Explained that it takes a lot of energy to breath and when they are short of breath often they will need to have a good diet to help keep up with the calories they are expending for breathing. RD appointment deferred. RD appointment deferred.     Expected Outcome Short: Choose and plan snacks accordingly to patients caloric intake to improve breathing. Long: Maintain a diet independently that meets their caloric intake to aid in daily shortness of breath. -- --        Nutrition Goals Discharge (Final Nutrition Goals Re-Evaluation):  Nutrition Goals Re-Evaluation - 11/05/24 1452       Goals   Comment RD appointment deferred.          Psychosocial: Target Goals: Acknowledge presence or absence of significant depression and/or stress, maximize coping skills, provide positive support system. Participant is able to verbalize types and ability to use techniques and skills needed for reducing stress and depression.   Education: Stress, Anxiety, and Depression - Group verbal and visual presentation to define topics covered.  Reviews how body is impacted by stress, anxiety, and depression.  Also discusses healthy ways to reduce stress and to treat/manage anxiety and depression.  Written material provided at class time.   Education: Sleep Hygiene -Provides group verbal and written instruction about how sleep can affect your health.  Define sleep hygiene, discuss sleep cycles and impact of sleep habits. Review good sleep hygiene tips.    Initial Review  & Psychosocial Screening:   Quality of Life Scores:  Scores of 19 and below usually indicate a poorer quality of life in these areas.  A difference of  2-3 points is a clinically meaningful difference.  A difference of 2-3 points in the total score of the Quality of Life Index has been associated with significant improvement in overall quality of life, self-image, physical symptoms, and general health in studies assessing change in quality of life.  PHQ-9: Review Flowsheet  More data exists      08/30/2024 07/19/2024 05/30/2024 01/19/2024 03/10/2023  Depression screen PHQ 2/9  Decreased Interest 1 3 2 2  0  Down, Depressed, Hopeless 2 2 2  0 1  PHQ - 2 Score 3 5 4 2 1   Altered sleeping 3 3 3 2  -  Tired, decreased energy 3 3 3 3  -  Change in appetite 0 3 3 2  -  Feeling bad or failure about yourself  1 2 2 2  -  Trouble concentrating 0 1 1 1  -  Moving slowly or fidgety/restless 0 1 1 3  -  Suicidal thoughts 0 0 0 0 -  PHQ-9  Score 10  18  17  15   -  Difficult doing work/chores Very difficult Very difficult Very difficult Extremely dIfficult -    Details       Data saved with a previous flowsheet row definition        Interpretation of Total Score  Total Score Depression Severity:  1-4 = Minimal depression, 5-9 = Mild depression, 10-14 = Moderate depression, 15-19 = Moderately severe depression, 20-27 = Severe depression   Psychosocial Evaluation and Intervention:   Psychosocial Re-Evaluation:  Psychosocial Re-Evaluation     Row Name 07/19/24 1600 08/30/24 1605 11/05/24 1440         Psychosocial Re-Evaluation   Current issues with Current Stress Concerns Current Stress Concerns Current Stress Concerns     Comments Reviewed patient health questionnaire (PHQ-9) with patient for follow up. Previously, patients score indicated signs/symptoms of depression.  Reviewed to see if patient is improving symptom wise while in program.  Score declined and patient states that it is because he  has been getting worse with his lung issues. He is not able to do the things that he used to do and finds himself short of breath alot. Reviewed patient health questionnaire (PHQ-9) with patient for follow up. Previously, patients score indicated signs/symptoms of depression.  Reviewed to see if patient is improving symptom wise while in program.  Score improved and patient states that it is because he has been able to exercise some. Tim reports current stress concerns related to his health condition. He has stopped his treatment for his lung infection because it was not working and the doctors state there is no more treatment options. He states he feels he is coming to be at peace with that and he is proud of what he has accomplished in his life. He enjoys spending time with his family and is happy his three kids all live right near him and his wife. He reports that he mainly does the program to help with his wife's feelings towards his prognosis. She doesn't want him sitting around, so he comes to the program to stay active. He knows that if he was more sedentary, his quality of life would be worse off. He does feel frustrated at times thinking of how long he has fought his different health conditions all ending in this outcome.     Expected Outcomes Short: Continue to work toward an improvement in PHQ9 scores by attending [facility] regularly. Long: Continue to improve stress and depression coping skills by talking with staff and attending [facility] regularly and work toward a positive mental state. Short: Continue to attend [facility] regularly for regular exercise and social engagement. Long: Continue to improve symptoms and manage a positive mental state. Short: attend program for physical and mental health benefits. Long: graduate from program     Interventions Encouraged to attend Pulmonary Rehabilitation for the exercise Encouraged to attend Pulmonary Rehabilitation for the exercise  Encouraged to attend Pulmonary Rehabilitation for the exercise;Relaxation education     Continue Psychosocial Services  Follow up required by staff Follow up required by staff Follow up required by staff        Psychosocial Discharge (Final Psychosocial Re-Evaluation):  Psychosocial Re-Evaluation - 11/05/24 1440       Psychosocial Re-Evaluation   Current issues with Current Stress Concerns    Comments Tim reports current stress concerns related to his health condition. He has stopped his treatment for his lung infection because it was not working and the doctors state  there is no more treatment options. He states he feels he is coming to be at peace with that and he is proud of what he has accomplished in his life. He enjoys spending time with his family and is happy his three kids all live right near him and his wife. He reports that he mainly does the program to help with his wife's feelings towards his prognosis. She doesn't want him sitting around, so he comes to the program to stay active. He knows that if he was more sedentary, his quality of life would be worse off. He does feel frustrated at times thinking of how long he has fought his different health conditions all ending in this outcome.    Expected Outcomes Short: attend program for physical and mental health benefits. Long: graduate from program    Interventions Encouraged to attend Pulmonary Rehabilitation for the exercise;Relaxation education    Continue Psychosocial Services  Follow up required by staff          Education: Education Goals: Education classes will be provided on a weekly basis, covering required topics. Participant will state understanding/return demonstration of topics presented.  Learning Barriers/Preferences:   General Pulmonary Education Topics:  Infection Prevention: - Provides verbal and written material to individual with discussion of infection control including proper hand washing and proper  equipment cleaning during exercise session. Flowsheet Row Pulmonary Rehab from 05/30/2024 in [facility] Cardiac and Pulmonary Rehab  Date 05/30/24  Educator [facility]  Instruction Review Code 1- Verbalizes Understanding    Falls Prevention: - Provides verbal and written material to individual with discussion of falls prevention and safety. Flowsheet Row Pulmonary Rehab from 05/30/2024 in [facility] Cardiac and Pulmonary Rehab  Date 05/30/24  Educator [facility]  Instruction Review Code 1- Verbalizes Understanding    Chronic Lung Disease Review: - Group verbal instruction with posters, models, PowerPoint presentations and videos,  to review new updates, new respiratory medications, new advancements in procedures and treatments. Providing information on websites and 800 numbers for continued self-education. Includes information about supplement oxygen, available portable oxygen systems, continuous and intermittent flow rates, oxygen safety, concentrators, and Medicare reimbursement for oxygen. Explanation of Pulmonary Drugs, including class, frequency, complications, importance of spacers, rinsing mouth after steroid MDI's, and proper cleaning methods for nebulizers. Review of basic lung anatomy and physiology related to function, structure, and complications of lung disease. Review of risk factors. Discussion about methods for diagnosing sleep apnea and types of masks and machines for OSA. Includes a review of the use of types of environmental controls: home humidity, furnaces, filters, dust mite/pet prevention, HEPA vacuums. Discussion about weather changes, air quality and the benefits of nasal washing. Instruction on Warning signs, infection symptoms, calling MD promptly, preventive modes, and value of vaccinations. Review of effective airway clearance, coughing and/or vibration techniques. Emphasizing that all should Create an Action Plan. Written material provided at class time. Flowsheet Row Pulmonary Rehab from  01/26/2024 in [facility] Cardiac and Pulmonary Rehab  Education need identified 01/26/24    AED/CPR: - Group verbal and written instruction with the use of models to demonstrate the basic use of the AED with the basic ABC's of resuscitation.    Tests and Procedures:  - Group verbal and visual presentation and models provide information about basic cardiac anatomy and function. Reviews the testing methods done to diagnose heart disease and the outcomes of the test results. Describes the treatment choices: Medical Management, Angioplasty, or Coronary Bypass Surgery for treating various heart conditions including Myocardial Infarction,  Angina, Valve Disease, and Cardiac Arrhythmias.  Written material provided at class time.   Medication Safety: - Group verbal and visual instruction to review commonly prescribed medications for heart and lung disease. Reviews the medication, class of the drug, and side effects. Includes the steps to properly store meds and maintain the prescription regimen.  Written material given at graduation.   Other: -Provides group and verbal instruction on various topics (see comments)   Knowledge Questionnaire Score:  Knowledge Questionnaire Score - 06/19/24 0842       Knowledge Questionnaire Score   Pre Score 14/18           Core Components/Risk Factors/Patient Goals at Admission:  Personal Goals and Risk Factors at Admission - 05/30/24 1526       Core Components/Risk Factors/Patient Goals on Admission   Tobacco Cessation Yes    Number of packs per day <2 ppd    Intervention Assist the participant in steps to quit. Provide individualized education and counseling about committing to Tobacco Cessation, relapse prevention, and pharmacological support that can be provided by physician.;Education officer, environmental, assist with locating and accessing local/national Quit Smoking programs, and support quit date choice.    Expected Outcomes Short Term: Will demonstrate  readiness to quit, by selecting a quit date.;Short Term: Will quit all tobacco product use, adhering to prevention of relapse plan.;Long Term: Complete abstinence from all tobacco products for at least 12 months from quit date.    Improve shortness of breath with ADL's Yes    Intervention Provide education, individualized exercise plan and daily activity instruction to help decrease symptoms of SOB with activities of daily living.    Expected Outcomes Short Term: Improve cardiorespiratory fitness to achieve a reduction of symptoms when performing ADLs;Long Term: Be able to perform more ADLs without symptoms or delay the onset of symptoms    Hypertension Yes    Intervention Provide education on lifestyle modifcations including regular physical activity/exercise, weight management, moderate sodium restriction and increased consumption of fresh fruit, vegetables, and low fat dairy, alcohol moderation, and smoking cessation.;Monitor prescription use compliance.    Expected Outcomes Short Term: Continued assessment and intervention until BP is < 140/53mm HG in hypertensive participants. < 130/88mm HG in hypertensive participants with diabetes, heart failure or chronic kidney disease.;Long Term: Maintenance of blood pressure at goal levels.    Lipids Yes    Intervention Provide education and support for participant on nutrition & aerobic/resistive exercise along with prescribed medications to achieve LDL 70mg , HDL >40mg .    Expected Outcomes Short Term: Participant states understanding of desired cholesterol values and is compliant with medications prescribed. Participant is following exercise prescription and nutrition guidelines.;Long Term: Cholesterol controlled with medications as prescribed, with individualized exercise RX and with personalized nutrition plan. Value goals: LDL < 70mg , HDL > 40 mg.          Education:Diabetes - Individual verbal and written instruction to review signs/symptoms of  diabetes, desired ranges of glucose level fasting, after meals and with exercise. Acknowledge that pre and post exercise glucose checks will be done for 3 sessions at entry of program.   Know Your Numbers and Heart Failure: - Group verbal and visual instruction to discuss disease risk factors for cardiac and pulmonary disease and treatment options.  Reviews associated critical values for Overweight/Obesity, Hypertension, Cholesterol, and Diabetes.  Discusses basics of heart failure: signs/symptoms and treatments.  Introduces Heart Failure Zone chart for action plan for heart failure. Written material provided at class time.  Core Components/Risk Factors/Patient Goals Review:   Goals and Risk Factor Review     Row Name 07/19/24 1554 08/30/24 1600 11/05/24 1435         Core Components/Risk Factors/Patient Goals Review   Personal Goals Review Improve shortness of breath with ADL's Other;Tobacco Cessation Improve shortness of breath with ADL's;Hypertension;Lipids     Review Spoke to patient about their shortness of breath and what they can do to improve. Patient has been informed of breathing techniques when starting the program. Patient is informed to tell staff if they have had any med changes and that certain meds they are taking or not taking can be causing shortness of breath. Patient states he does not want to do anymore treatments for his lungs such as radiation. He speaks with his doctor about his CT results soon. Informed him that exercising will benifit his overall health and to continue if he wants to. He has not quit smoking and does not plan to quit looking at his past notes. Velinda is continuing to take his medication for his blood pressure as needed and for his cholesterol. He states his numbers have been stable in these two areas. He is still short of breath which his doctors are aware of. He has stopped all treatment for his lung infection and his pulmonologist states there are no other  treatment options to explore. He continues to come to the program to stay active and help with his breathing in any way he can     Expected Outcomes Short: Attend [facility] regularly to improve shortness of breath with ADL's. Long: maintain independence with ADL's Short: continue to exercise. Long: maintain exercise independently. Short: continue to attend program Long: independently maintain risk factor management        Core Components/Risk Factors/Patient Goals at Discharge (Final Review):   Goals and Risk Factor Review - 11/05/24 1435       Core Components/Risk Factors/Patient Goals Review   Personal Goals Review Improve shortness of breath with ADL's;Hypertension;Lipids    Review Velinda is continuing to take his medication for his blood pressure as needed and for his cholesterol. He states his numbers have been stable in these two areas. He is still short of breath which his doctors are aware of. He has stopped all treatment for his lung infection and his pulmonologist states there are no other treatment options to explore. He continues to come to the program to stay active and help with his breathing in any way he can    Expected Outcomes Short: continue to attend program Long: independently maintain risk factor management          ITP Comments:  ITP Comments     Row Name 05/30/24 1516 06/11/24 1404 06/27/24 0945 07/25/24 1409 08/22/24 1039   ITP Comments Completed and gym orientation for pulmonary rehab. Initial ITP created and sent for review to Dr. Faud Aleskerov, Medical Director. First full day of exercise!  Patient was oriented to gym and equipment including functions, settings, policies, and procedures.  Patient's individual exercise prescription and treatment plan were reviewed.  All starting workloads were established based on the results of the 6 minute walk test done at initial orientation visit.  The plan for exercise progression was also introduced and progression will be  customized based on patient's performance and goals. 30 Day review completed. Medical Director ITP review done; changes made as directed and signed approval by Medical Director. New to program. 30 Day review completed. Medical Director ITP  review done; changes made as directed and signed approval by Medical Director. 30 Day review completed. Medical Director ITP review done; changes made as directed and signed approval by Medical Director.    Row Name 09/19/24 1249 10/17/24 0853 11/14/24 1043       ITP Comments 30 Day review completed. Medical Director ITP review done, changes made as directed, and signed approval by Medical Director. 30 Day review completed. Medical Director ITP review done, changes made as directed, and signed approval by Medical Director. 30 Day review completed. Medical Director ITP review done, changes made as directed, and signed approval by Medical Director.        Comments: 30 Day Review      [1]  Current Outpatient Medications:    acetaminophen  (TYLENOL ) 500 MG tablet, Take 500 mg by mouth daily as needed., Disp: , Rfl:    albuterol  (PROVENTIL ) (2.5 MG/3ML) 0.083% nebulizer solution, Take 3 mLs (2.5 mg total) by nebulization 4 (four) times daily as needed for wheezing or shortness of breath., Disp: 75 mL, Rfl: 11   albuterol  (VENTOLIN  HFA) 108 (90 Base) MCG/ACT inhaler, INHALE 2 PUFFS BY MOUTH EVERY 4 HOURS AS NEEDED FOR WHEEZE OR FOR SHORTNESS OF BREATH, Disp: 18 each, Rfl: 3   arformoterol  (BROVANA ) 15 MCG/2ML NEBU, Take 2 mLs (15 mcg total) by nebulization 2 (two) times daily., Disp: 120 mL, Rfl: 11   aspirin EC 81 MG tablet, Take 81 mg by mouth daily. Swallow whole., Disp: , Rfl:    atorvastatin  (LIPITOR) 10 MG tablet, Take 10 mg by mouth daily., Disp: , Rfl:    azithromycin  (ZITHROMAX ) 500 MG tablet, Take 500 mg by mouth., Disp: , Rfl:    budesonide  (PULMICORT ) 0.5 MG/2ML nebulizer solution, TAKE 2 ML (0.5 MG TOTAL) BY NEBULIZATION TWICE A DAY, Disp: 360 mL, Rfl:  3   chlorpheniramine-HYDROcodone  (TUSSIONEX) 10-8 MG/5ML, Take 5 mLs by mouth every 12 (twelve) hours as needed for cough. (Patient not taking: Reported on 10/04/2024), Disp: 115 mL, Rfl: 0   clotrimazole  (MYCELEX ) 10 MG troche, Take 10 mg by mouth 3 (three) times daily., Disp: , Rfl:    cyanocobalamin (VITAMIN B12) 1000 MCG/ML injection, Inject 1,000 mcg into the muscle., Disp: , Rfl:    diazepam  (VALIUM ) 5 MG tablet, Take 5 mg by mouth at bedtime as needed., Disp: , Rfl:    ferrous sulfate  325 (65 FE) MG tablet, Take 325 mg by mouth at bedtime., Disp: , Rfl:    Fluocinolone Acetonide 0.01 % OIL, PLEASE SEE ATTACHED FOR DETAILED DIRECTIONS, Disp: , Rfl:    guaiFENesin  (MUCINEX ) 600 MG 12 hr tablet, Take 1 tablet (600 mg total) by mouth 2 (two) times daily., Disp: 60 tablet, Rfl: 0   hydrocortisone  (CORTEF ) 10 MG tablet, Take 2 tablets in the morning and 1 tablet mid afternoon (2 to 4 PM), Disp: 90 tablet, Rfl: 0   levothyroxine  (SYNTHROID ) 175 MCG tablet, Take 175 mcg by mouth daily before breakfast., Disp: , Rfl:    lidocaine -prilocaine  (EMLA ) cream, APPLY 1 APPLICATION TOPICALLY AS NEEDED., Disp: 30 g, Rfl: 1   magnesium  oxide (MAG-OX) 400 MG tablet, Take 800 mg by mouth 2 (two) times daily., Disp: , Rfl:    melatonin 3 MG TABS tablet, Take 3 mg by mouth at bedtime as needed. (Patient not taking: Reported on 10/04/2024), Disp: , Rfl:    methylPREDNISolone  (MEDROL  DOSEPAK) 4 MG TBPK tablet, Take as directed in the package, this is a taper pack., Disp: 21 tablet, Rfl: 0  naproxen  sodium (ANAPROX  DS) 550 MG tablet, Take 1 tablet (550 mg total) by mouth 2 (two) times daily as needed for moderate pain (pain score 4-6). Take with meals, Disp: 30 tablet, Rfl: 1   Nebulizers (COMPRESSOR/NEBULIZER) MISC, Use as directed for nebulized medications., Disp: 1 each, Rfl: 0   nicotine  (NICODERM CQ  - DOSED IN MG/24 HOURS) 21 mg/24hr patch, Place 21 mg onto the skin daily., Disp: , Rfl:    nitroGLYCERIN   (NITROSTAT ) 0.4 MG SL tablet, Place under the tongue., Disp: , Rfl:    OXYGEN, Inhale 2 L into the lungs at bedtime., Disp: , Rfl:    pantoprazole  (PROTONIX ) 40 MG tablet, Take 1 tablet (40 mg total) by mouth 2 (two) times daily before meals, Disp: , Rfl:    promethazine -dextromethorphan  (PROMETHAZINE -DM) 6.25-15 MG/5ML syrup, Take 5 mLs by mouth 4 (four) times daily as needed for cough., Disp: 180 mL, Rfl: 1   sodium chloride  1 g tablet, Take 5 tablets (5 g total) by mouth 3 (three) times daily with meals., Disp: , Rfl:    tadalafil  (CIALIS ) 5 MG tablet, Take 1 tablet (5 mg total) by mouth daily as needed for erectile dysfunction., Disp: 90 tablet, Rfl: 3   traMADol  (ULTRAM ) 50 MG tablet, Take 50 mg by mouth every 6 (six) hours as needed. (Patient not taking: Reported on 10/04/2024), Disp: , Rfl:    Vitamin D, Ergocalciferol, (DRISDOL) 1.25 MG (50000 UNIT) CAPS capsule, Take 50,000 Units by mouth once a week., Disp: , Rfl:    YUPELRI  175 MCG/3ML nebulizer solution, TAKE 3 MLS (175 MCG TOTAL) BY NEBULIZATION DAILY. DX: J44.9, Disp: 90 mL, Rfl: 11 No current facility-administered medications for this visit.  Facility-Administered Medications Ordered in Other Visits:    heparin  lock flush 100 unit/mL, 500 Units, Intravenous, Once, [name], Evalene PARAS, MD [2]  Social History Tobacco Use  Smoking Status Every Day   Current packs/day: 1.50   Average packs/day: 3.0 packs/day for 53.0 years (157.1 ttl pk-yrs)   Types: Cigarettes   Start date: 1973   Passive exposure: Current  Smokeless Tobacco Never

## 2024-11-15 ENCOUNTER — Encounter

## 2024-11-19 ENCOUNTER — Encounter: Admitting: Emergency Medicine

## 2024-11-19 DIAGNOSIS — J449 Chronic obstructive pulmonary disease, unspecified: Secondary | ICD-10-CM

## 2024-11-19 NOTE — Progress Notes (Signed)
 Daily Session Note  Patient Details  Name: Paul Carpenter MRN: [id number] Date of Birth: April 16, 1956 Referring Provider:   [facility] from 05/30/2024 in [facility]  Referring Provider Dr. Dedra Sanders    Encounter Date: 11/19/2024  Check In:  Session Check In - 11/19/24 1355       Check-In   Supervising physician immediately available to respond to emergencies See telemetry face sheet for immediately available ER MD    Location [facility]    Staff Present Leita Franks RN,BSN;Maxon Burnell BS, Exercise Physiologist;Margaret Best, MS, Exercise Physiologist;Noah Tickle, BS, Exercise Physiologist    Virtual Visit No    Medication changes reported     No    Fall or balance concerns reported    No    Tobacco Cessation No Change    Warm-up and Cool-down Performed on first and last piece of equipment    Resistance Training Performed Yes    VAD Patient? No    PAD/SET Patient? No      Pain Assessment   Currently in Pain? No/denies             Tobacco Use History[1]  Goals Met:  Proper associated with RPD/PD & O2 Sat Independence with exercise equipment Using PLB without cueing & demonstrates good technique Exercise tolerated well No report of concerns or symptoms today Strength training completed today  Goals Unmet:  Not Applicable  Comments: Pt able to follow exercise prescription today without complaint.  Will continue to monitor for progression.    Dr. Oneil Pinal is Medical Director for [facility].  Dr. Fuad Aleskerov is Medical Director for [facility] Pulmonary Rehabilitation.    [1]  Social History Tobacco Use  Smoking Status Every Day   Current packs/day: 1.50   Average packs/day: 3.0 packs/day for 53.1 years (157.1 ttl pk-yrs)   Types: Cigarettes   Start date: 1973   Passive exposure: Current  Smokeless Tobacco Never

## 2024-11-22 ENCOUNTER — Encounter

## 2024-11-26 ENCOUNTER — Encounter

## 2024-11-29 ENCOUNTER — Encounter

## 2024-12-03 ENCOUNTER — Encounter

## 2024-12-06 ENCOUNTER — Encounter: Payer: Self-pay | Admitting: Pulmonary Disease

## 2024-12-06 ENCOUNTER — Ambulatory Visit
Admission: RE | Admit: 2024-12-06 | Discharge: 2024-12-06 | Disposition: A | Source: Ambulatory Visit | Attending: Pulmonary Disease | Admitting: Pulmonary Disease

## 2024-12-06 ENCOUNTER — Encounter: Payer: Self-pay | Admitting: *Deleted

## 2024-12-06 ENCOUNTER — Encounter: Attending: Pulmonary Disease

## 2024-12-06 ENCOUNTER — Ambulatory Visit: Admitting: Pulmonary Disease

## 2024-12-06 VITALS — BP 120/66 | HR 110 | Temp 97.7°F | Ht 72.0 in | Wt 159.0 lb

## 2024-12-06 DIAGNOSIS — I5020 Unspecified systolic (congestive) heart failure: Secondary | ICD-10-CM

## 2024-12-06 DIAGNOSIS — J449 Chronic obstructive pulmonary disease, unspecified: Secondary | ICD-10-CM

## 2024-12-06 DIAGNOSIS — J440 Chronic obstructive pulmonary disease with acute lower respiratory infection: Secondary | ICD-10-CM

## 2024-12-06 DIAGNOSIS — E2740 Unspecified adrenocortical insufficiency: Secondary | ICD-10-CM

## 2024-12-06 DIAGNOSIS — R0902 Hypoxemia: Secondary | ICD-10-CM

## 2024-12-06 DIAGNOSIS — R0602 Shortness of breath: Secondary | ICD-10-CM

## 2024-12-06 DIAGNOSIS — F17210 Nicotine dependence, cigarettes, uncomplicated: Secondary | ICD-10-CM

## 2024-12-06 DIAGNOSIS — A310 Pulmonary mycobacterial infection: Secondary | ICD-10-CM

## 2024-12-06 DIAGNOSIS — J439 Emphysema, unspecified: Secondary | ICD-10-CM

## 2024-12-06 DIAGNOSIS — G4736 Sleep related hypoventilation in conditions classified elsewhere: Secondary | ICD-10-CM

## 2024-12-06 DIAGNOSIS — R059 Cough, unspecified: Secondary | ICD-10-CM

## 2024-12-06 MED ORDER — AMOXICILLIN-POT CLAVULANATE 875-125 MG PO TABS: 1.0000 | ORAL_TABLET | Freq: Two times a day (BID) | ORAL | 0 refills | Status: AC

## 2024-12-06 MED ORDER — DOXYCYCLINE HYCLATE 100 MG PO TABS: 100.0000 mg | ORAL_TABLET | Freq: Two times a day (BID) | ORAL | 0 refills | Status: AC

## 2024-12-06 MED ORDER — METHYLPREDNISOLONE 4 MG PO TBPK: ORAL_TABLET | ORAL | 0 refills | Status: AC

## 2024-12-06 NOTE — Progress Notes (Signed)
 Pulmonary Individual Treatment Plan  Patient Details  Name: Paul Carpenter MRN: [id number] Date of Birth: September 01, 1956 Referring Provider:   Conrad Ports Pulmonary Rehab from 05/30/2024 in [facility] Card[facility] and Pul[facility] Rehab  Referring Provider Dr. Dedra Sanders    Initial Encounter Date:  Flowsheet Row Pulmonary Rehab from 05/30/2024 in [facility] Cardiac and Pulmonary Rehab  Date 05/30/24    Visit Diagnosis: Chronic obstructive pulmonary disease, unspecified COPD type (HCC)  Patient's Home Medications on Admission: Current Medications[1]  Past Medical History: Past Medical History:  Diagnosis Date   Anginal pain    Anxiety    Asthma    Chest pain    CHF (congestive heart failure) (HCC)    Chicken pox    Complication of anesthesia    o2 dropped after neck fusion   COPD (chronic obstructive pulmonary disease) (HCC)    Coronary artery disease    Cough    chronic  clear phlegm   Dysrhythmia    palpitations   GERD (gastroesophageal reflux disease)    h/o reflux/ hoarsness   Hematochezia    Hemorrhoids    History of chickenpox    History of colon polyps    History of Helicobacter pylori infection    Hoarseness    Hypertension    Lung cancer (HCC) 05/2016   Chemo + rad tx's.    Migraines    OSA (obstructive sleep apnea)    has CPAP but does not use   Personal history of tobacco use, presenting hazards to health 03/05/2016   Pneumonia    5/17   Raynaud disease    Raynaud disease    Raynaud's disease    Rotator cuff tear    on right   Shortness of breath dyspnea    Sleep apnea    Ulcer (traumatic) of oral mucosa     Tobacco Use: Tobacco Use History[2]  Labs: Review Flowsheet        No data to display           Pulmonary Assessment Scores:  Pulmonary Assessment Scores     Row Name 06/11/24 1717         ADL UCSD   ADL Phase Entry     SOB Score total 89     Rest 3     Walk 3     Stairs 5     Bath 4     Dress 2     Shop 5         UCSD: Self-administered rating of dyspnea associated with activities of daily living (ADLs) 6-point scale (0 = not at all to 5 = maximal or unable to do because of breathlessness)  Scoring Scores range from 0 to 120.  Minimally important difference is 5 units  CAT: CAT can identify the health impairment of COPD patients and is better correlated with disease progression.  CAT has a scoring range of zero to 40. The CAT score is classified into four groups of low (less than 10), medium (10 - 20), high (21-30) and very high (31-40) based on the impact level of disease on health status. A CAT score over 10 suggests significant symptoms.  A worsening CAT score could be explained by an exacerbation, poor medication adherence, poor inhaler technique, or progression of COPD or comorbid conditions.  CAT MCID is 2 points  mMRC: mMRC (Modified Medical Research Council) Dyspnea Scale is used to assess the degree of baseline functional disability in patients of respiratory disease due to dyspnea.  No minimal important difference is established. A decrease in score of 1 point or greater is considered a positive change.   Pulmonary Function Assessment:   Exercise Target Goals: Exercise Program Goal: Individual exercise prescription set using results from initial 6 min walk test and THRR while considering  patients activity barriers and safety.   Exercise Prescription Goal: Initial exercise prescription builds to 30-45 minutes a day of aerobic activity, 2-3 days per week.  Home exercise guidelines will be given to patient during program as part of exercise prescription that the participant will acknowledge.  Education: Aerobic Exercise: - Group verbal and visual presentation on the components of exercise prescription. Introduces F.I.T.T principle from ACSM for exercise prescriptions.  Reviews F.I.T.T. principles of aerobic exercise including progression. Written material provided at class  time.   Education: Resistance Exercise: - Group verbal and visual presentation on the components of exercise prescription. Introduces F.I.T.T principle from ACSM for exercise prescriptions  Reviews F.I.T.T. principles of resistance exercise including progression. Written material provided at class time.    Education: Exercise & Equipment Safety: - Individual verbal instruction and demonstration of equipment use and safety with use of the equipment. Flowsheet Row Pulmonary Rehab from 05/30/2024 in [facility] Cardiac and Pulmonary Rehab  Date 05/30/24  Educator [facility]  Instruction Review Code 1- Verbalizes Understanding    Education: Exercise Physiology & General Exercise Guidelines: - Group verbal and written instruction with models to review the exercise physiology of the cardiovascular system and associated critical values. Provides general exercise guidelines with specific guidelines to those with heart or lung disease.    Education: Flexibility, Balance, Mind/Body Relaxation: - Group verbal and visual presentation with interactive activity on the components of exercise prescription. Introduces F.I.T.T principle from ACSM for exercise prescriptions. Reviews F.I.T.T. principles of flexibility and balance exercise training including progression. Also discusses the mind body connection.  Reviews various relaxation techniques to help reduce and manage stress (i.e. Deep breathing, progressive muscle relaxation, and visualization). Balance handout provided to take home. Written material provided at class time.   Activity Barriers & Risk Stratification:   6 Minute Walk:  Oxygen Initial Assessment:   Oxygen Re-Evaluation:  Oxygen Re-Evaluation     Row Name 06/11/24 1413 07/19/24 1552 08/30/24 1607 11/05/24 1455       Program Oxygen Prescription   Program Oxygen Prescription -- None None None      Home Oxygen   Home Oxygen Device -- Home Concentrator Home Concentrator Home Concentrator     Sleep Oxygen Prescription -- Continuous Continuous Continuous    Liters per minute -- 2 2 2     Home Exercise Oxygen Prescription -- None None None    Liters per minute -- --  prn --  prn --  prn    Home Resting Oxygen Prescription -- None None None    Compliance with Home Oxygen Use -- Yes Yes Yes      Goals/Expected Outcomes   Short Term Goals -- To learn and demonstrate proper pursed lip breathing techniques or other breathing techniques.  To learn and demonstrate proper use of respiratory medications To learn and understand importance of monitoring SPO2 with pulse oximeter and demonstrate accurate use of the pulse oximeter.;To learn and understand importance of maintaining oxygen saturations>88%;To learn and demonstrate proper pursed lip breathing techniques or other breathing techniques.     Long  Term Goals -- Exhibits proper breathing techniques, such as pursed lip breathing or other method taught during program session Compliance with respiratory medication Maintenance  of O2 saturations>88%;Verbalizes importance of monitoring SPO2 with pulse oximeter and return demonstration;Compliance with respiratory medication;Exhibits proper breathing techniques, such as pursed lip breathing or other method taught during program session    Comments Reviewed PLB technique with pt.  Talked about how it works and it's importance in maintaining their exercise saturations. Informed patient how to perform the Pursed Lipped breathing technique. Told patient to Inhale through the nose and out the mouth with pursed lips to keep their airways open, help oxygenate them better, practice when at rest or doing strenuous activity. Patient Verbalizes understanding of technique and will work on and be reiterated during [facility]. Paul Carpenter takes his breathing treatments as needed. He uses oxygen at night and monitors his oxygen level. He knows to keep his oxygen above 88 percent. Paul Carpenter uses his oxygen at night as needed appropriately.  He continues to check his oxygen saturation and practices his pursed lip breathing as needed.    Goals/Expected Outcomes Short: Become more profiecient at using PLB.   Long: Become independent at using PLB. Short: use PLB with exertion. Long: use PLB on exertion proficiently and independently. Short: continue to exercise to improve lung function. Long: maintain exercise post [facility]. Short: attend pulmonary rehab for shortness of breath management Long: Maintain oxygen saturation management.       Oxygen Discharge (Final Oxygen Re-Evaluation):  Oxygen Re-Evaluation - 11/05/24 1455       Program Oxygen Prescription   Program Oxygen Prescription None      Home Oxygen   Home Oxygen Device Home Concentrator    Sleep Oxygen Prescription Continuous    Liters per minute 2    Home Exercise Oxygen Prescription None    Liters per minute --   prn   Home Resting Oxygen Prescription None    Compliance with Home Oxygen Use Yes      Goals/Expected Outcomes   Short Term Goals To learn and understand importance of monitoring SPO2 with pulse oximeter and demonstrate accurate use of the pulse oximeter.;To learn and understand importance of maintaining oxygen saturations>88%;To learn and demonstrate proper pursed lip breathing techniques or other breathing techniques.     Long  Term Goals Maintenance of O2 saturations>88%;Verbalizes importance of monitoring SPO2 with pulse oximeter and return demonstration;Compliance with respiratory medication;Exhibits proper breathing techniques, such as pursed lip breathing or other method taught during program session    Comments Paul Carpenter uses his oxygen at night as needed appropriately. He continues to check his oxygen saturation and practices his pursed lip breathing as needed.    Goals/Expected Outcomes Short: attend pulmonary rehab for shortness of breath management Long: Maintain oxygen saturation management.          Initial Exercise Prescription:   Perform  Capillary Blood Glucose checks as needed.  Exercise Prescription Changes:   Exercise Prescription Changes     Row Name 06/20/24 1100 07/04/24 1500 07/19/24 0800 08/02/24 1600 08/15/24 1100     Response to Exercise   Blood Pressure (Admit) 134/70 122/60 128/76 110/62 162/84   Blood Pressure (Exercise) 154/82 142/82 122/62 142/60 --   Blood Pressure (Exit) 116/64 136/78 126/62 128/72 132/72   Heart Rate (Admit) 92 bpm 92 bpm 103 bpm 100 bpm 94 bpm   Heart Rate (Exercise) 101 bpm 113 bpm 90 bpm 106 bpm 100 bpm   Heart Rate (Exit) 88 bpm 98 bpm 85 bpm 93 bpm 88 bpm   Oxygen Saturation (Admit) 94 % 93 % 96 % 96 % 95 %   Oxygen Saturation (  Exercise) 94 % 89 % 93 % 93 % 94 %   Oxygen Saturation (Exit) 96 % 94 % 96 % 95 % 98 %   Rating of Perceived Exertion (Exercise) 13 15 11 13 13    Perceived Dyspnea (Exercise) 3 3 3 3 3    Symptoms none none none none none   Comments first 2 weeks of exercise -- -- -- --   Duration Progress to 30 minutes of  aerobic without signs/symptoms of physical distress Progress to 30 minutes of  aerobic without signs/symptoms of physical distress Progress to 30 minutes of  aerobic without signs/symptoms of physical distress Progress to 30 minutes of  aerobic without signs/symptoms of physical distress Progress to 30 minutes of  aerobic without signs/symptoms of physical distress   Intensity THRR unchanged THRR unchanged THRR unchanged THRR unchanged THRR unchanged     Progression   Progression Continue to progress workloads to maintain intensity without signs/symptoms of physical distress. Continue to progress workloads to maintain intensity without signs/symptoms of physical distress. Continue to progress workloads to maintain intensity without signs/symptoms of physical distress. Continue to progress workloads to maintain intensity without signs/symptoms of physical distress. Continue to progress workloads to maintain intensity without signs/symptoms of physical  distress.   Average METs 1.37 1.72 1.2 1.21 1.45     Resistance Training   Training Prescription Yes Yes Yes Yes Yes   Weight 3lb 3lb 3lb 3lb 3 lb   Reps 10-15 10-15 10-15 10-15 10-15     Interval Training   Interval Training No No No No No     Oxygen   Oxygen Continuous -- -- -- --   Liters 0 -- -- -- --     Treadmill   MPH -- -- -- 0.7 --   Grade -- -- -- 0 --   Minutes -- -- -- 15 --   METs -- -- -- 1.54 --     Recumbant Bike   Level -- 1 -- -- --   Watts -- 25 -- -- --   Minutes -- 15 -- -- --   METs -- 3.09 -- -- --     NuStep   Level 2 3 1 2 2    Minutes 15 15 30 30 30    METs 1.5 1.1 1.2 1.3 1.7     REL-XR   Level -- 1 -- 1 --   Minutes -- 15 -- 15 --   METs -- -- -- 1 --     Biostep-RELP   Level 1 -- -- -- --   Minutes 15 -- -- -- --   METs 1 -- -- -- --     Track   Laps 9 -- -- -- --   Minutes 15 -- -- -- --   METs 1.49 -- -- -- --     Oxygen   Maintain Oxygen Saturation 88% or higher 88% or higher 88% or higher 88% or higher 88% or higher    Row Name 08/27/24 1100 09/12/24 1100 09/24/24 1100 09/24/24 1400 10/10/24 0700     Response to Exercise   Blood Pressure (Admit) 124/72 140/72 130/80 -- 132/70   Blood Pressure (Exit) 140/76 128/72 126/82 -- 118/64   Heart Rate (Admit) 100 bpm 95 bpm 96 bpm -- 104 bpm   Heart Rate (Exercise) 102 bpm 99 bpm 99 bpm -- 113 bpm   Heart Rate (Exit) 94 bpm 92 bpm 92 bpm -- 104 bpm   Oxygen Saturation (Admit) 95 % 93 % 90 % --  90 %   Oxygen Saturation (Exercise) 94 % 89 % 91 % -- 92 %   Oxygen Saturation (Exit) 94 % 96 % 90 % -- 96 %   Rating of Perceived Exertion (Exercise) 14 15 12  -- 13   Perceived Dyspnea (Exercise) -- 3 1 -- 2   Symptoms none none none -- none   Duration Progress to 30 minutes of  aerobic without signs/symptoms of physical distress Continue with 30 min of aerobic exercise without signs/symptoms of physical distress. Continue with 30 min of aerobic exercise without signs/symptoms of physical  distress. Continue with 30 min of aerobic exercise without signs/symptoms of physical distress. Continue with 30 min of aerobic exercise without signs/symptoms of physical distress.   Intensity THRR unchanged THRR unchanged THRR unchanged THRR unchanged THRR unchanged     Progression   Progression Continue to progress workloads to maintain intensity without signs/symptoms of physical distress. Continue to progress workloads to maintain intensity without signs/symptoms of physical distress. Continue to progress workloads to maintain intensity without signs/symptoms of physical distress. Continue to progress workloads to maintain intensity without signs/symptoms of physical distress. Continue to progress workloads to maintain intensity without signs/symptoms of physical distress.   Average METs 1.4 1.4 1.4 1.4 1     Resistance Training   Training Prescription Yes Yes Yes Yes Yes   Weight 3 lb 3 lb 3 lb 3 lb 3 lb   Reps 10-15 10-15 10-15 10-15 10-15     Interval Training   Interval Training No No No No No     NuStep   Level 3 2 2 2  --   Minutes 30 30 30 30  --   METs -- 1.5 1.4 1.4 --     REL-XR   Level -- -- -- -- 1   Minutes -- -- -- -- 15   METs -- -- -- -- 1     T5 Nustep   Level 1 -- -- -- --   Minutes 15 -- -- -- --   METs 1.4 -- -- -- --     Home Exercise Plan   Plans to continue exercise at -- -- -- Home (comment)  walk and peddle machine Home (comment)  walk and peddle machine   Frequency -- -- -- Add 2 additional days to program exercise sessions. Add 2 additional days to program exercise sessions.   Initial Home Exercises Provided -- -- -- 09/24/24 09/24/24     Oxygen   Maintain Oxygen Saturation 88% or higher 88% or higher 88% or higher 88% or higher 88% or higher    Row Name 10/30/24 1500 11/14/24 0800 11/28/24 1400         Response to Exercise   Blood Pressure (Admit) 120/62 122/70 130/70     Blood Pressure (Exit) 124/72 124/70 134/68     Heart Rate (Admit) 96  bpm 103 bpm 108 bpm     Heart Rate (Exercise) 106 bpm 110 bpm 105 bpm     Heart Rate (Exit) 94 bpm 104 bpm 100 bpm     Oxygen Saturation (Admit) 90 % 95 % 91 %     Oxygen Saturation (Exercise) 90 % 93 % 90 %     Oxygen Saturation (Exit) 92 % 91 % 90 %     Rating of Perceived Exertion (Exercise) 15 15 15      Perceived Dyspnea (Exercise) 3 0 3     Symptoms none none none     Duration Continue with 30 min  of aerobic exercise without signs/symptoms of physical distress. Continue with 30 min of aerobic exercise without signs/symptoms of physical distress. Continue with 30 min of aerobic exercise without signs/symptoms of physical distress.     Intensity THRR unchanged THRR unchanged THRR unchanged       Progression   Progression Continue to progress workloads to maintain intensity without signs/symptoms of physical distress. Continue to progress workloads to maintain intensity without signs/symptoms of physical distress. Continue to progress workloads to maintain intensity without signs/symptoms of physical distress.     Average METs 1.63 3.07 3.07       Resistance Training   Weight 3 lb 3 lb 3 lb     Reps 10-15 10-15 10-15       Interval Training   Interval Training No No No       Recumbant Bike   Level -- 1 --     Watts -- 25 --     Minutes -- 15 --     METs -- 3.07 --       NuStep   Level 2 -- --     Minutes 30 -- --     METs 2 -- --       REL-XR   Level 1 -- --     Minutes 15 -- --       T5 Nustep   Level -- 1 1     Minutes -- 15 30     METs -- -- 1       Track   Laps 9 -- --     Minutes 15 -- --     METs 1.49 -- --       Home Exercise Plan   Plans to continue exercise at Home (comment)  walk and peddle machine Home (comment)  walk and peddle machine Home (comment)  walk and peddle machine     Frequency Add 2 additional days to program exercise sessions. Add 2 additional days to program exercise sessions. Add 2 additional days to program exercise sessions.      Initial Home Exercises Provided 09/24/24 09/24/24 09/24/24       Oxygen   Maintain Oxygen Saturation 88% or higher 88% or higher 88% or higher        Exercise Comments:   Exercise Comments     Row Name 06/11/24 1404           Exercise Comments First full day of exercise!  Patient was oriented to gym and equipment including functions, settings, policies, and procedures.  Patient's individual exercise prescription and treatment plan were reviewed.  All starting workloads were established based on the results of the 6 minute walk test done at initial orientation visit.  The plan for exercise progression was also introduced and progression will be customized based on patient's performance and goals.          Exercise Goals and Review:   Exercise Goals Re-Evaluation :  Exercise Goals Re-Evaluation     Row Name 06/11/24 1405 06/11/24 1407 06/20/24 1136 07/04/24 1519 07/19/24 0825     Exercise Goal Re-Evaluation   Exercise Goals Review Increase Physical Activity;Able to understand and use rate of perceived exertion (RPE) scale;Knowledge and understanding of Target Heart Rate Range (THRR);Understanding of Exercise Prescription;Increase Strength and Stamina;Able to understand and use Dyspnea scale;Able to check pulse independently Increase Physical Activity;Able to understand and use rate of perceived exertion (RPE) scale;Knowledge and understanding of Target Heart Rate Range (THRR);Understanding of Exercise Prescription;Increase Strength  and Stamina;Able to understand and use Dyspnea scale;Able to check pulse independently Increase Physical Activity;Increase Strength and Stamina;Understanding of Exercise Prescription Increase Physical Activity;Increase Strength and Stamina;Understanding of Exercise Prescription Increase Physical Activity;Increase Strength and Stamina;Understanding of Exercise Prescription   Comments Reviewed RPE and dyspnea scale, THR and program prescription with pt today.  Pt  voiced understanding and was given a copy of goals to take home. -- Paul Carpenter is off to a good start in the program. He has been able to attend his first 2 sessions during this review period. During his sessions he was able to walk 9 laps on the track, and use the T4 nustep at level 2. We will continue to monitor his progress in the program. Paul Carpenter is off to a good start in the program. He increased to level 3 on the T4 nustep. He worked at level 1 on the recumbent bike and XR. We will continue to monitor his progress in the program. Paul Carpenter is is doing well in rehab and was only able to attend one session in this review. We will encourage him to be more consistent with rehab for exercise benefits. He decreased down to level 1 on the nustep in this session for 30 minutes. We will continue to monitor his progress in the program.   Expected Outcomes Short: Use RPE daily to regulate intensity.  Long: Follow program prescription in THR. -- Short: Continue to follow exercise prescription. Long: Continue exercise to improve strength and stamina. Short: Continue to progressively increase T4 nustep workload. Long: Continue exercise to improve strength and stamina. Short: Be more consistent with rehab. Long: Continue exercise to improve strength and stamina.    Row Name 08/02/24 1656 08/15/24 1133 08/27/24 1147 09/12/24 1103 09/24/24 1120     Exercise Goal Re-Evaluation   Exercise Goals Review Increase Physical Activity;Increase Strength and Stamina;Understanding of Exercise Prescription Increase Physical Activity;Increase Strength and Stamina;Understanding of Exercise Prescription Increase Physical Activity;Increase Strength and Stamina;Understanding of Exercise Prescription Increase Physical Activity;Increase Strength and Stamina;Understanding of Exercise Prescription Increase Physical Activity;Increase Strength and Stamina;Understanding of Exercise Prescription   Comments Paul Carpenter is is doing well in rehab. He increased to level 2  on the T4 nustep. He maintained level 1 on the XR. He tried the treadmill and was able to do a speed of 0.62mph and no incline. We will continue to monitor his progress in the program. Paul Carpenter is is doing well in rehab. He has only worked on the T4 nustep since the last review. He continues to do well with 3 lb handweights for resistance training. We will continue to monitor his progress in the program. Paul Carpenter continues to do well in rehab. He was only able to attend 2 sessions during this review period, but was able to increase from level 2 to 3 on the T4 nustep. We will continue to monitor his progress in the program. Paul Carpenter has only attended two sessions since the last review. He continues to work on the T4 nustep at level 2 for 30 minutes. We will continue to monitor his progress in the program. Paul Carpenter has only attended one session during this review period. During his one session he was able to use the T4 nustep at level 2 for 30 minutes. We will continue to monitor his progress in the program.   Expected Outcomes Short: Continue to progressively increase XR and nustep workloads. Long: Continue exercise to improve strength and stamina. Short: Use other aerobic machines for exercise. Long: Continue exercise to improve strength and  stamina. Short: Attend rehab more frequently. Long: Continue exercise to improve strength and stamina. Short: Attend rehab more consistently. Long: Continue exercise to improve strength and stamina. Short: Attend rehab more consistently. Long: Continue exercise to improve strength and stamina.    Row Name 09/24/24 1426 10/10/24 0743 10/30/24 1514 11/05/24 1511 11/14/24 0855     Exercise Goal Re-Evaluation   Exercise Goals Review Increase Physical Activity;Able to understand and use rate of perceived exertion (RPE) scale;Knowledge and understanding of Target Heart Rate Range (THRR);Understanding of Exercise Prescription;Increase Strength and Stamina;Able to understand and use Dyspnea scale;Able  to check pulse independently Increase Physical Activity;Increase Strength and Stamina;Understanding of Exercise Prescription Increase Physical Activity;Increase Strength and Stamina;Understanding of Exercise Prescription Able to understand and use Dyspnea scale;Knowledge and understanding of Target Heart Rate Range (THRR);Understanding of Exercise Prescription;Able to understand and use rate of perceived exertion (RPE) scale Increase Physical Activity;Understanding of Exercise Prescription;Increase Strength and Stamina   Comments Reviewed home exercise with pt today from 2:05pm to 2:15pm.  Pt plans to walk and use a peddle machine at home for exercise.  Reviewed THR, pulse, RPE, sign and symptoms, pulse oximetery and when to call 911 or MD.  Also discussed weather considerations and indoor options.  Pt voiced understanding. Paul Carpenter is doing well in rehab. He was only able to attend 2 sessions during this review period, and was only able to use the XR during those sessions. We will continue to monitor his progress in the program. Paul Carpenter is doing well in rehab. He continues to work at level 2 on the T4 nustep. He also recently walked 9 laps on the track. We will continue to monitor his progress in the program. Paul Carpenter is aware of his home exercise plan. He uses his peddle machine at home some when his energy allows. He has been busy with the holiday season and plans to get back into consistenly adding the machine on his off days from the program. He tries to stay moving to help with his mood and stamina Paul Carpenter is doing well in rehab. He attended 1 session in this review period with the holidays. He maintained level 1 on the recumbent bike and T5 nustep. We will continue to monitor his progress in the program.   Expected Outcomes Short: add 1-2 days a week of exercise at home on off days of rehab. Long: maintain independent exercise routine upon graduation from pulmonary rehab. Short: Attend rehab more frequently. Long: Continue  exercise to improve strength and stamina. Short: Increase laps walked on the track. Long: Continue exercise to improve strength and stamina. Short: add one additional day of exercise at home. Long: independently manage exercise routine. Short: Try level 2 on the T5 nustep. Long: Continue exercise to improve strength and stamina.    Row Name 11/28/24 1432             Exercise Goal Re-Evaluation   Exercise Goals Review Increase Physical Activity;Understanding of Exercise Prescription;Increase Strength and Stamina       Comments Paul Carpenter only attended one session during the latest review period. During his one session he did well on the T5 nustep machine at level 1 for 30 minutes. We will continue to monitor his progress in the program.       Expected Outcomes Short: Attend rehab more consistently. Long: Continue exercise to improve strength and stamina.          Discharge Exercise Prescription (Final Exercise Prescription Changes):  Exercise Prescription Changes - 11/28/24 1400  Response to Exercise   Blood Pressure (Admit) 130/70    Blood Pressure (Exit) 134/68    Heart Rate (Admit) 108 bpm    Heart Rate (Exercise) 105 bpm    Heart Rate (Exit) 100 bpm    Oxygen Saturation (Admit) 91 %    Oxygen Saturation (Exercise) 90 %    Oxygen Saturation (Exit) 90 %    Rating of Perceived Exertion (Exercise) 15    Perceived Dyspnea (Exercise) 3    Symptoms none    Duration Continue with 30 min of aerobic exercise without signs/symptoms of physical distress.    Intensity THRR unchanged      Progression   Progression Continue to progress workloads to maintain intensity without signs/symptoms of physical distress.    Average METs 3.07      Resistance Training   Weight 3 lb    Reps 10-15      Interval Training   Interval Training No      T5 Nustep   Level 1    Minutes 30    METs 1      Home Exercise Plan   Plans to continue exercise at Home (comment)   walk and peddle machine    Frequency Add 2 additional days to program exercise sessions.    Initial Home Exercises Provided 09/24/24      Oxygen   Maintain Oxygen Saturation 88% or higher          Nutrition:  Target Goals: Understanding of nutrition guidelines, daily intake of sodium 1500mg , cholesterol 200mg , calories 30% from fat and 7% or less from saturated fats, daily to have 5 or more servings of fruits and vegetables.  Education: Nutrition 1 -Group instruction provided by verbal, written material, interactive activities, discussions, models, and posters to present general guidelines for heart healthy nutrition including macronutrients, label reading, and promoting whole foods over processed counterparts. Education serves as pensions consultant of discussion of heart healthy eating for all. Written material provided at class time.     Education: Nutrition 2 -Group instruction provided by verbal, written material, interactive activities, discussions, models, and posters to present general guidelines for heart healthy nutrition including sodium, cholesterol, and saturated fat. Providing guidance of habit forming to improve blood pressure, cholesterol, and body weight. Written material provided at class time.     Biometrics:    Nutrition Therapy Plan and Nutrition Goals:  Nutrition Therapy & Goals - 11/05/24 1452       Nutrition Therapy   RD appointment deferred Yes          Nutrition Assessments:  MEDIFICTS Score Key: >=70 Need to make dietary changes  40-70 Heart Healthy Diet <= 40 Therapeutic Level Cholesterol Diet  Flowsheet Row Pulmonary Rehab from 01/18/2024 in [facility]  Picture Your Plate Total Score on Admission 39   Picture Your Plate Scores: <59 Unhealthy dietary pattern with much room for improvement. 41-50 Dietary pattern unlikely to meet recommendations for good health and room for improvement. 51-60 More healthful dietary pattern, with some room for  improvement.  >60 Healthy dietary pattern, although there may be some specific behaviors that could be improved.   Nutrition Goals Re-Evaluation:  Nutrition Goals Re-Evaluation     Row Name 07/19/24 1555 08/30/24 1607 11/05/24 1452         Goals   Comment Patient was informed on why it is important to maintain a balanced diet when dealing with Respiratory issues. Explained that it takes a lot of energy to  breath and when they are short of breath often they will need to have a good diet to help keep up with the calories they are expending for breathing. RD appointment deferred. RD appointment deferred.     Expected Outcome Short: Choose and plan snacks accordingly to patients caloric intake to improve breathing. Long: Maintain a diet independently that meets their caloric intake to aid in daily shortness of breath. -- --        Nutrition Goals Discharge (Final Nutrition Goals Re-Evaluation):  Nutrition Goals Re-Evaluation - 11/05/24 1452       Goals   Comment RD appointment deferred.          Psychosocial: Target Goals: Acknowledge presence or absence of significant depression and/or stress, maximize coping skills, provide positive support system. Participant is able to verbalize types and ability to use techniques and skills needed for reducing stress and depression.   Education: Stress, Anxiety, and Depression - Group verbal and visual presentation to define topics covered.  Reviews how body is impacted by stress, anxiety, and depression.  Also discusses healthy ways to reduce stress and to treat/manage anxiety and depression.  Written material provided at class time.   Education: Sleep Hygiene -Provides group verbal and written instruction about how sleep can affect your health.  Define sleep hygiene, discuss sleep cycles and impact of sleep habits. Review good sleep hygiene tips.    Initial Review & Psychosocial Screening:   Quality of Life Scores:  Scores of 19 and  below usually indicate a poorer quality of life in these areas.  A difference of  2-3 points is a clinically meaningful difference.  A difference of 2-3 points in the total score of the Quality of Life Index has been associated with significant improvement in overall quality of life, self-image, physical symptoms, and general health in studies assessing change in quality of life.  PHQ-9: Review Flowsheet  More data exists      08/30/2024 07/19/2024 05/30/2024 01/19/2024 03/10/2023  Depression screen PHQ 2/9  Decreased Interest 1 3 2 2  0  Down, Depressed, Hopeless 2 2 2  0 1  PHQ - 2 Score 3 5 4 2 1   Altered sleeping 3 3 3 2  -  Tired, decreased energy 3 3 3 3  -  Change in appetite 0 3 3 2  -  Feeling bad or failure about yourself  1 2 2 2  -  Trouble concentrating 0 1 1 1  -  Moving slowly or fidgety/restless 0 1 1 3  -  Suicidal thoughts 0 0 0 0 -  PHQ-9 Score 10  18  17  15   -  Difficult doing work/chores Very difficult Very difficult Very difficult Extremely dIfficult -    Details       Data saved with a previous flowsheet row definition        Interpretation of Total Score  Total Score Depression Severity:  1-4 = Minimal depression, 5-9 = Mild depression, 10-14 = Moderate depression, 15-19 = Moderately severe depression, 20-27 = Severe depression   Psychosocial Evaluation and Intervention:   Psychosocial Re-Evaluation:  Psychosocial Re-Evaluation     Row Name 07/19/24 1600 08/30/24 1605 11/05/24 1440         Psychosocial Re-Evaluation   Current issues with Current Stress Concerns Current Stress Concerns Current Stress Concerns     Comments Reviewed patient health questionnaire (PHQ-9) with patient for follow up. Previously, patients score indicated signs/symptoms of depression.  Reviewed to see if patient is improving symptom wise while  in program.  Score declined and patient states that it is because he has been getting worse with his lung issues. He is not able to do the  things that he used to do and finds himself short of breath alot. Reviewed patient health questionnaire (PHQ-9) with patient for follow up. Previously, patients score indicated signs/symptoms of depression.  Reviewed to see if patient is improving symptom wise while in program.  Score improved and patient states that it is because he has been able to exercise some. Paul Carpenter reports current stress concerns related to his health condition. He has stopped his treatment for his lung infection because it was not working and the doctors state there is no more treatment options. He states he feels he is coming to be at peace with that and he is proud of what he has accomplished in his life. He enjoys spending time with his family and is happy his three kids all live right near him and his wife. He reports that he mainly does the program to help with his wife's feelings towards his prognosis. She doesn't want him sitting around, so he comes to the program to stay active. He knows that if he was more sedentary, his quality of life would be worse off. He does feel frustrated at times thinking of how long he has fought his different health conditions all ending in this outcome.     Expected Outcomes Short: Continue to work toward an improvement in PHQ9 scores by attending [facility] regularly. Long: Continue to improve stress and depression coping skills by talking with staff and attending [facility] regularly and work toward a positive mental state. Short: Continue to attend [facility] regularly for regular exercise and social engagement. Long: Continue to improve symptoms and manage a positive mental state. Short: attend program for physical and mental health benefits. Long: graduate from program     Interventions Encouraged to attend Pulmonary Rehabilitation for the exercise Encouraged to attend Pulmonary Rehabilitation for the exercise Encouraged to attend Pulmonary Rehabilitation for the  exercise;Relaxation education     Continue Psychosocial Services  Follow up required by staff Follow up required by staff Follow up required by staff        Psychosocial Discharge (Final Psychosocial Re-Evaluation):  Psychosocial Re-Evaluation - 11/05/24 1440       Psychosocial Re-Evaluation   Current issues with Current Stress Concerns    Comments Paul Carpenter reports current stress concerns related to his health condition. He has stopped his treatment for his lung infection because it was not working and the doctors state there is no more treatment options. He states he feels he is coming to be at peace with that and he is proud of what he has accomplished in his life. He enjoys spending time with his family and is happy his three kids all live right near him and his wife. He reports that he mainly does the program to help with his wife's feelings towards his prognosis. She doesn't want him sitting around, so he comes to the program to stay active. He knows that if he was more sedentary, his quality of life would be worse off. He does feel frustrated at times thinking of how long he has fought his different health conditions all ending in this outcome.    Expected Outcomes Short: attend program for physical and mental health benefits. Long: graduate from program    Interventions Encouraged to attend Pulmonary Rehabilitation for the exercise;Relaxation education    Continue Psychosocial Services  Follow  up required by staff          Education: Education Goals: Education classes will be provided on a weekly basis, covering required topics. Participant will state understanding/return demonstration of topics presented.  Learning Barriers/Preferences:   General Pulmonary Education Topics:  Infection Prevention: - Provides verbal and written material to individual with discussion of infection control including proper hand washing and proper equipment cleaning during exercise session. Flowsheet Row  Pulmonary Rehab from 05/30/2024 in [facility] Cardiac and Pulmonary Rehab  Date 05/30/24  Educator [facility]  Instruction Review Code 1- Verbalizes Understanding    Falls Prevention: - Provides verbal and written material to individual with discussion of falls prevention and safety. Flowsheet Row Pulmonary Rehab from 05/30/2024 in [facility] Cardiac and Pulmonary Rehab  Date 05/30/24  Educator [facility]  Instruction Review Code 1- Verbalizes Understanding    Chronic Lung Disease Review: - Group verbal instruction with posters, models, PowerPoint presentations and videos,  to review new updates, new respiratory medications, new advancements in procedures and treatments. Providing information on websites and 800 numbers for continued self-education. Includes information about supplement oxygen, available portable oxygen systems, continuous and intermittent flow rates, oxygen safety, concentrators, and Medicare reimbursement for oxygen. Explanation of Pulmonary Drugs, including class, frequency, complications, importance of spacers, rinsing mouth after steroid MDI's, and proper cleaning methods for nebulizers. Review of basic lung anatomy and physiology related to function, structure, and complications of lung disease. Review of risk factors. Discussion about methods for diagnosing sleep apnea and types of masks and machines for OSA. Includes a review of the use of types of environmental controls: home humidity, furnaces, filters, dust mite/pet prevention, HEPA vacuums. Discussion about weather changes, air quality and the benefits of nasal washing. Instruction on Warning signs, infection symptoms, calling MD promptly, preventive modes, and value of vaccinations. Review of effective airway clearance, coughing and/or vibration techniques. Emphasizing that all should Create an Action Plan. Written material provided at class time. Flowsheet Row Pulmonary Rehab from 01/26/2024 in [facility] Cardiac and Pulmonary Rehab  Education need  identified 01/26/24    AED/CPR: - Group verbal and written instruction with the use of models to demonstrate the basic use of the AED with the basic ABC's of resuscitation.    Tests and Procedures:  - Group verbal and visual presentation and models provide information about basic cardiac anatomy and function. Reviews the testing methods done to diagnose heart disease and the outcomes of the test results. Describes the treatment choices: Medical Management, Angioplasty, or Coronary Bypass Surgery for treating various heart conditions including Myocardial Infarction, Angina, Valve Disease, and Cardiac Arrhythmias.  Written material provided at class time.   Medication Safety: - Group verbal and visual instruction to review commonly prescribed medications for heart and lung disease. Reviews the medication, class of the drug, and side effects. Includes the steps to properly store meds and maintain the prescription regimen.  Written material given at graduation.   Other: -Provides group and verbal instruction on various topics (see comments)   Knowledge Questionnaire Score:  Knowledge Questionnaire Score - 06/19/24 0842       Knowledge Questionnaire Score   Pre Score 14/18           Core Components/Risk Factors/Patient Goals at Admission:   Education:Diabetes - Individual verbal and written instruction to review signs/symptoms of diabetes, desired ranges of glucose level fasting, after meals and with exercise. Acknowledge that pre and post exercise glucose checks will be done for 3 sessions at entry of program.  Know Your Numbers and Heart Failure: - Group verbal and visual instruction to discuss disease risk factors for cardiac and pulmonary disease and treatment options.  Reviews associated critical values for Overweight/Obesity, Hypertension, Cholesterol, and Diabetes.  Discusses basics of heart failure: signs/symptoms and treatments.  Introduces Heart Failure Zone chart for  action plan for heart failure. Written material provided at class time.   Core Components/Risk Factors/Patient Goals Review:   Goals and Risk Factor Review     Row Name 07/19/24 1554 08/30/24 1600 11/05/24 1435         Core Components/Risk Factors/Patient Goals Review   Personal Goals Review Improve shortness of breath with ADL's Other;Tobacco Cessation Improve shortness of breath with ADL's;Hypertension;Lipids     Review Spoke to patient about their shortness of breath and what they can do to improve. Patient has been informed of breathing techniques when starting the program. Patient is informed to tell staff if they have had any med changes and that certain meds they are taking or not taking can be causing shortness of breath. Patient states he does not want to do anymore treatments for his lungs such as radiation. He speaks with his doctor about his CT results soon. Informed him that exercising will benifit his overall health and to continue if he wants to. He has not quit smoking and does not plan to quit looking at his past notes. Paul Carpenter is continuing to take his medication for his blood pressure as needed and for his cholesterol. He states his numbers have been stable in these two areas. He is still short of breath which his doctors are aware of. He has stopped all treatment for his lung infection and his pulmonologist states there are no other treatment options to explore. He continues to come to the program to stay active and help with his breathing in any way he can     Expected Outcomes Short: Attend [facility] regularly to improve shortness of breath with ADL's. Long: maintain independence with ADL's Short: continue to exercise. Long: maintain exercise independently. Short: continue to attend program Long: independently maintain risk factor management        Core Components/Risk Factors/Patient Goals at Discharge (Final Review):   Goals and Risk Factor Review - 11/05/24 1435       Core  Components/Risk Factors/Patient Goals Review   Personal Goals Review Improve shortness of breath with ADL's;Hypertension;Lipids    Review Paul Carpenter is continuing to take his medication for his blood pressure as needed and for his cholesterol. He states his numbers have been stable in these two areas. He is still short of breath which his doctors are aware of. He has stopped all treatment for his lung infection and his pulmonologist states there are no other treatment options to explore. He continues to come to the program to stay active and help with his breathing in any way he can    Expected Outcomes Short: continue to attend program Long: independently maintain risk factor management          ITP Comments:  ITP Comments     Row Name 06/11/24 1404 06/27/24 0945 07/25/24 1409 08/22/24 1039 09/19/24 1249   ITP Comments First full day of exercise!  Patient was oriented to gym and equipment including functions, settings, policies, and procedures.  Patient's individual exercise prescription and treatment plan were reviewed.  All starting workloads were established based on the results of the 6 minute walk test done at initial orientation visit.  The plan for exercise  progression was also introduced and progression will be customized based on patient's performance and goals. 30 Day review completed. Medical Director ITP review done; changes made as directed and signed approval by Medical Director. New to program. 30 Day review completed. Medical Director ITP review done; changes made as directed and signed approval by Medical Director. 30 Day review completed. Medical Director ITP review done; changes made as directed and signed approval by Medical Director. 30 Day review completed. Medical Director ITP review done, changes made as directed, and signed approval by Medical Director.    Row Name 10/17/24 0853 11/14/24 1043 12/06/24 1441       ITP Comments 30 Day review completed. Medical Director ITP review  done, changes made as directed, and signed approval by Medical Director. 30 Day review completed. Medical Director ITP review done, changes made as directed, and signed approval by Medical Director. Paul Carpenter called staff to state he wishes to be discharged at this time due to ongoing health issues. He completed 27 of 36 sessions        Comments: Early discharge ITP      [1]  Current Outpatient Medications:    acetaminophen  (TYLENOL ) 500 MG tablet, Take 500 mg by mouth daily as needed., Disp: , Rfl:    albuterol  (PROVENTIL ) (2.5 MG/3ML) 0.083% nebulizer solution, Take 3 mLs (2.5 mg total) by nebulization 4 (four) times daily as needed for wheezing or shortness of breath., Disp: 75 mL, Rfl: 11   albuterol  (VENTOLIN  HFA) 108 (90 Base) MCG/ACT inhaler, INHALE 2 PUFFS BY MOUTH EVERY 4 HOURS AS NEEDED FOR WHEEZE OR FOR SHORTNESS OF BREATH, Disp: 18 each, Rfl: 3   amoxicillin -clavulanate (AUGMENTIN ) 875-125 MG tablet, Take 1 tablet by mouth 2 (two) times daily for 7 days., Disp: 14 tablet, Rfl: 0   arformoterol  (BROVANA ) 15 MCG/2ML NEBU, Take 2 mLs (15 mcg total) by nebulization 2 (two) times daily., Disp: 120 mL, Rfl: 11   aspirin EC 81 MG tablet, Take 81 mg by mouth daily. Swallow whole., Disp: , Rfl:    atorvastatin  (LIPITOR) 10 MG tablet, Take 10 mg by mouth daily., Disp: , Rfl:    budesonide  (PULMICORT ) 0.5 MG/2ML nebulizer solution, TAKE 2 ML (0.5 MG TOTAL) BY NEBULIZATION TWICE A DAY, Disp: 360 mL, Rfl: 3   cyanocobalamin (VITAMIN B12) 1000 MCG/ML injection, Inject 1,000 mcg into the muscle., Disp: , Rfl:    diazepam  (VALIUM ) 5 MG tablet, Take 5 mg by mouth at bedtime as needed., Disp: , Rfl:    doxycycline  (VIBRA -TABS) 100 MG tablet, Take 1 tablet (100 mg total) by mouth 2 (two) times daily for 7 days., Disp: 14 tablet, Rfl: 0   Ensifentrine  (OHTUVAYRE ) 3 MG/2.5ML SUSP, Inhale into the lungs 2 (two) times daily., Disp: , Rfl:    ferrous sulfate  325 (65 FE) MG tablet, Take 325 mg by mouth at  bedtime., Disp: , Rfl:    Fluocinolone Acetonide 0.01 % OIL, PLEASE SEE ATTACHED FOR DETAILED DIRECTIONS, Disp: , Rfl:    guaiFENesin  (MUCINEX ) 600 MG 12 hr tablet, Take 1 tablet (600 mg total) by mouth 2 (two) times daily., Disp: 60 tablet, Rfl: 0   hydrocortisone  (CORTEF ) 10 MG tablet, Take 2 tablets in the morning and 1 tablet mid afternoon (2 to 4 PM), Disp: 90 tablet, Rfl: 0   levothyroxine  (SYNTHROID ) 175 MCG tablet, Take 175 mcg by mouth daily before breakfast., Disp: , Rfl:    lidocaine -prilocaine  (EMLA ) cream, APPLY 1 APPLICATION TOPICALLY AS NEEDED., Disp: 30 g, Rfl:  1   magnesium  oxide (MAG-OX) 400 MG tablet, Take 800 mg by mouth 2 (two) times daily., Disp: , Rfl:    melatonin 3 MG TABS tablet, Take 3 mg by mouth at bedtime as needed. (Patient not taking: Reported on 12/06/2024), Disp: , Rfl:    methylPREDNISolone  (MEDROL  DOSEPAK) 4 MG TBPK tablet, Take as directed in the package, this is a taper pack., Disp: 21 tablet, Rfl: 0   naproxen  sodium (ANAPROX  DS) 550 MG tablet, Take 1 tablet (550 mg total) by mouth 2 (two) times daily as needed for moderate pain (pain score 4-6). Take with meals, Disp: 30 tablet, Rfl: 1   Nebulizers (COMPRESSOR/NEBULIZER) MISC, Use as directed for nebulized medications., Disp: 1 each, Rfl: 0   nicotine  (NICODERM CQ  - DOSED IN MG/24 HOURS) 21 mg/24hr patch, Place 21 mg onto the skin daily., Disp: , Rfl:    nitroGLYCERIN  (NITROSTAT ) 0.4 MG SL tablet, Place under the tongue., Disp: , Rfl:    OXYGEN, Inhale 2 L into the lungs at bedtime., Disp: , Rfl:    pantoprazole  (PROTONIX ) 40 MG tablet, Take 1 tablet (40 mg total) by mouth 2 (two) times daily before meals, Disp: , Rfl:    sodium chloride  1 g tablet, Take 5 tablets (5 g total) by mouth 3 (three) times daily with meals., Disp: , Rfl:    tadalafil  (CIALIS ) 5 MG tablet, Take 1 tablet (5 mg total) by mouth daily as needed for erectile dysfunction., Disp: 90 tablet, Rfl: 3   traMADol  (ULTRAM ) 50 MG tablet, Take 50 mg  by mouth every 6 (six) hours as needed., Disp: , Rfl:    Vitamin D, Ergocalciferol, (DRISDOL) 1.25 MG (50000 UNIT) CAPS capsule, Take 50,000 Units by mouth once a week., Disp: , Rfl:    YUPELRI  175 MCG/3ML nebulizer solution, TAKE 3 MLS (175 MCG TOTAL) BY NEBULIZATION DAILY. DX: J44.9, Disp: 90 mL, Rfl: 11 No current facility-administered medications for this visit.  Facility-Administered Medications Ordered in Other Visits:    heparin  lock flush 100 unit/mL, 500 Units, Intravenous, Once, [name], Evalene PARAS, MD [2]  Social History Tobacco Use  Smoking Status Every Day   Current packs/day: 1.50   Average packs/day: 3.0 packs/day for 53.1 years (157.1 ttl pk-yrs)   Types: Cigarettes   Start date: 1973   Passive exposure: Current  Smokeless Tobacco Never

## 2024-12-06 NOTE — Progress Notes (Signed)
 "  Subjective:    Patient ID: Paul Carpenter, male    DOB: 03-May-1956, 69 y.o.   MRN: [id number]  Patient Care Team: Fayette Bodily, MD as PCP - General (Infectious Diseases) Dew, Selinda RAMAN, MD as Referring Physician (Vascular Surgery) Jacobo Evalene PARAS, MD as Consulting Physician (Oncology) Lenn Aran, MD as Referring Physician (Radiation Oncology) Tamea Dedra CROME, MD as Consulting Physician (Pulmonary Disease) Fayette Bodily, MD as Consulting Physician (Infectious Diseases) Katie Vinie LOISE, MD as Referring Physician (Pulmonary Disease)  Chief Complaint  Patient presents with   COPD    Cough is getting worse, with cold air.     BACKGROUND/INTERVAL:Hallis is a very complex 69 year old current smoker (1.0 PPD, 156 PY) with a history as noted below, who presents for follow-up of a cavitary right upper lobe process in the setting of history of non-small cell carcinoma of the lung, Mycobacterium avium complex infection, stage IV COPD and chronic dyspnea/fatigue.  This is a scheduled visit.  He was last seen 04 October 2024, he was also followed at [facility] at the pulmonary specialty clinic for management of his resistant MAC.  MAC medications were discontinued due to persistence of disease and nonresponse and follow-up at [facility] then stopped.  He continues to smoke 1 pack of cigarettes per day.   HPI Discussed the use of AI scribe software for clinical note transcription with the patient, who gave verbal consent to proceed.  History of Present Illness   Paul Carpenter is a 69 year old male with COPD and mycobacterium avium infection who presents with chronic cough and respiratory issues.  He presents with his wife, Paul Carpenter.  He has a history of COPD and mycobacterium avium infection resistant to antibiotics. He experiences a chronic cough and worsening respiratory symptoms. He recently started using Ohtuvayre  a couple of weeks ago but missed a week due to a prescription  issue. He is currently on his second refill. He continues to smoke about a pack a day.  He describes episodes of significant respiratory distress, including a recent incident where he had to use oxygen quickly due to a drop in oxygen saturation, waking up with levels as low as 88%. It takes him an hour to an hour and a half to wean off oxygen in the mornings. He frequently uses a rescue inhaler every three hours.  He reports a burning and stinging sensation on his side, describing it as feeling like 'something is eating me up inside.'  This actually is not a new complaint and has been present since he underwent radiotherapy for his lung cancer.  He has not been taking azithromycin  as it was not refilled. He mentions a recent illness requiring a Medrol  Dosepak for treatment.  His wife notes that he smokes in a sun porch area.  This area becomes smoke-filled when he smokes, contributing to his exposure to smoke.  He has failed numerous attempts to quitting.  He is not motivated to quit.      Review of Systems A 10 point review of systems was performed and it is as noted above otherwise negative.   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   Port-A-Cath in place 01/17/2024   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   Chronic adrenal insufficiency 11/09/2023   Acute on chronic hypoxic respiratory failure (HCC) 11/09/2023   COPD with acute exacerbation (HCC) 11/08/2023   Pulmonary cavitary lesion 11/08/2023   Chronic respiratory failure with hypoxia (HCC) 11/08/2023  Hyponatremia 09/11/2022   Generalized weakness 09/10/2022   Shortness of breath 02/03/2022   SIADH (syndrome of inappropriate ADH production) 09/22/2021   Overweight (BMI 25.0-29.9) 09/22/2021   Hypomagnesemia 09/21/2021   Elevated hemoglobin A1c 06/19/2020   Hypothyroidism, acquired 05/30/2019   Squamous cell lung cancer, right (HCC) 05/30/2019   HFrEF (heart failure  with reduced ejection fraction) (HCC) 03/06/2019   Atherosclerosis 12/14/2018   Non-small cell lung cancer, right (HCC) 04/24/2018   Oral ulcer 02/16/2018   Pituitary disorder 02/16/2018   Migraine headache 03/07/2017   HCAP (healthcare-associated pneumonia) 09/11/2016   COPD exacerbation (HCC) 09/11/2016   Chronic hyponatremia 09/11/2016   Leukocytosis 09/11/2016   Thrombocytopenia 09/11/2016   Cigarette smoker 06/11/2016   Cervical radiculopathy 04/15/2016   Cervical disc disorder at C5-C6 level with radiculopathy 03/09/2016   Impingement syndrome of right shoulder 03/09/2016   Health care maintenance 09/29/2015   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   Benign essential hypertension 05/28/2015   Polycythemia 03/24/2015   Carotid artery disease 12/12/2014   Disequilibrium 12/12/2014   Mixed hyperlipidemia 12/10/2014   Incomplete emptying of bladder 06/04/2014   Anxiety 05/18/2014   Chronic coronary artery disease 05/18/2014   Chronic headaches 05/18/2014   Acute shoulder pain 03/15/2014   Impingement syndrome of left shoulder 03/15/2014   Lung mass 12/06/2013   Kidney stone 11/24/2013   COPD (chronic obstructive pulmonary disease) (HCC) 04/24/2013   Tobacco use disorder 04/24/2013   Obstructive sleep apnea 04/24/2013   Benign localized prostatic hyperplasia with lower urinary tract symptoms (LUTS) 07/04/2012   Encounter for long-term current use of medication 07/04/2012   ED (erectile dysfunction) of organic origin 07/04/2012   Testicular hypofunction 07/04/2012    Social History   Tobacco Use   Smoking status: Every Day    Current packs/day: 1.50    Average packs/day: 3.0 packs/day for 53.1 years (157.1 ttl pk-yrs)    Types: Cigarettes    Start date: 1973    Passive exposure: Current   Smokeless tobacco: Never  Substance Use Topics   Alcohol use: Not Currently    Alcohol/week: 2.0 standard drinks of alcohol    Types: 2 Standard drinks or equivalent per week    Comment:  moderate    Allergies[1]  Active Medications[2]  Immunization History  Administered Date(s) Administered   Fluad Quad(high Dose 65+) 09/22/2020, 08/01/2024   Influenza Inj Mdck Quad Pf 09/06/2016, 10/02/2019, 09/09/2021, 09/03/2022   Influenza Split 08/05/2013, 07/23/2014, 08/18/2015   Influenza Whole 08/01/2012   Influenza,inj,Quad PF,6+ Mos 08/14/2018   Influenza-Unspecified 09/06/2016, 08/17/2017, 09/09/2021   PNEUMOCOCCAL CONJUGATE-20 02/08/2024   Zoster Recombinant(Shingrix) 06/19/2020        Objective:     Vitals:   12/06/24 1110  BP: 120/66  Pulse: (!) 110  Temp: 97.7 F (36.5 C)  Height: 6' (1.829 m)  Weight: 159 lb (72.1 kg)  SpO2: 97%  TempSrc: Temporal  BMI (Calculated): 21.56     SpO2: 97 %  GENERAL: Chronically ill-appearing man, mild tachypnea.  No conversational dyspnea.  Awake and alert.   HEAD: Normocephalic, atraumatic.  EYES: Pupils equal, round, reactive to light.  No scleral icterus.  MOUTH: Oral mucosa moist.  No thrush. NECK: Supple. No thyromegaly. Trachea midline. No JVD.  No adenopathy. PULMONARY: Good air entry bilaterally. Amphoric sounds on the right upper lung zone.  Scattered rhonchi and wheezes throughout noted today.   CARDIOVASCULAR: S1 and S2.  Tachycardic, regular rhythm.  No rubs, murmurs or gallops heard. ABDOMEN: Benign. MUSCULOSKELETAL: No joint deformity, no  clubbing, no edema.  NEUROLOGIC: No overt focal deficit. Speech is fluent. SKIN: Intact,warm,dry. PSYCH: Depressed mood and flat affect.   Chest x-ray performed today, independently reviewed, shows chronic changes but no acute infiltrate seen, formal report pending:     Assessment & Plan:     ICD-10-CM   1. Stage 4 very severe COPD by GOLD classification (HCC)  J44.9     2. Nocturnal hypoxemia due to emphysema (HCC)  J43.9    G47.36     3. Mycobacterium avium complex (HCC) - resistant  A31.0     4. HFrEF (heart failure with reduced ejection fraction) (HCC)   I50.20     5. Chronic adrenal insufficiency  E27.40     6. Tobacco dependence due to cigarettes  F17.210     7. COPD with acute lower respiratory infection (HCC)  J44.0 DG Chest 2 View    8. Shortness of breath  R06.02 DG Chest 2 View      Orders Placed This Encounter  Procedures   DG Chest 2 View    Standing Status:   Future    Number of Occurrences:   1    Expected Date:   12/06/2024    Expiration Date:   12/06/2025    Reason for Exam (SYMPTOM  OR DIAGNOSIS REQUIRED):   Shortness of breath, pneumonia suspected    Preferred imaging location?:   [facility]    Meds ordered this encounter  Medications   amoxicillin -clavulanate (AUGMENTIN ) 875-125 MG tablet    Sig: Take 1 tablet by mouth 2 (two) times daily for 7 days.    Dispense:  14 tablet    Refill:  0   doxycycline  (VIBRA -TABS) 100 MG tablet    Sig: Take 1 tablet (100 mg total) by mouth 2 (two) times daily for 7 days.    Dispense:  14 tablet    Refill:  0   methylPREDNISolone  (MEDROL  DOSEPAK) 4 MG TBPK tablet    Sig: Take as directed in the package, this is a taper pack.    Dispense:  21 tablet    Refill:  0   Discussion:    COPD exacerbation in the setting of stage 4 very severe COPD COPD exacerbation with increased dyspnea and frequent use of rescue inhaler. Oxygen saturation drops significantly, requiring supplemental oxygen. Continued smoking is contributing to poor outcomes.Current treatment with Ohtuvayre  is ongoing, but effectiveness is yet to be determined. - Continue Ohtuvayre  treatment - Ordered chest x-ray to assess for pneumonia - Prescribed Medrol  Dosepak for exacerbation - Advised smoking cessation to improve quality of life and reduce exacerbations  Suspected pneumonia Due to recent respiratory symptoms and need for antibiotics. Awaiting chest x-ray results for confirmation. - Ordered chest x-ray to evaluate for pneumonia - Prescribed Augmentin  and doxycycline  - Medrol  Dosepak  Nocturnal  hypoxemia due to emphysema Nocturnal hypoxemia with oxygen saturation dropping to 88-90% in the mornings. Requires supplemental oxygen and gradual weaning in the morning to prevent carbon monoxide buildup. - Advised gradual weaning of oxygen in the morning to prevent carbon monoxide buildup  Pulmonary mycobacterium avium complex infection Medications stopped due to ineffectiveness and persistent infection. - Continue to monitor expectantly  Chronic adrenal insufficiency Requiring adjustment of maintenance medication during exacerbations. Prednisone  is used during exacerbations, and maintenance medication is resumed post-exacerbation. - Prescribed Medrol  Dosepak for exacerbation - Instructed to resume maintenance medication after completing Medrol  Dosepak  Tobacco dependence due to cigarettes Continued tobacco use despite known adverse effects on COPD and  overall health. Smoking cessation is critical for improving quality of life and reducing exacerbations. - Advised smoking cessation to improve quality of life and reduce exacerbations     Will see the patient in follow-up in 3 weeks time.  Advised if symptoms do not improve or worsen, to please contact office for sooner follow up or seek emergency care.    I spent 41 minutes of dedicated to the care of this patient on the date of this encounter to include pre-visit review of records, face-to-face time with the patient discussing conditions above, post visit ordering of testing, clinical documentation with the electronic health record, making appropriate referrals as documented, and communicating necessary findings to members of the patients care team.     C. Leita Sanders, MD Advanced Bronchoscopy P[facility]    *This note was generated using voice recognition software/Dragon and/or AI transcription program.  Despite best efforts to proofread, errors can occur which can change the meaning. Any transcriptional  errors that result from this process are unintentional and may not be fully corrected at the time of dictation.     [1]  Allergies Allergen Reactions   Lisinopril Rash   Varenicline  Rash  [2]  Current Meds  Medication Sig   acetaminophen  (TYLENOL ) 500 MG tablet Take 500 mg by mouth daily as needed.   albuterol  (PROVENTIL ) (2.5 MG/3ML) 0.083% nebulizer solution Take 3 mLs (2.5 mg total) by nebulization 4 (four) times daily as needed for wheezing or shortness of breath.   albuterol  (VENTOLIN  HFA) 108 (90 Base) MCG/ACT inhaler INHALE 2 PUFFS BY MOUTH EVERY 4 HOURS AS NEEDED FOR WHEEZE OR FOR SHORTNESS OF BREATH   amoxicillin -clavulanate (AUGMENTIN ) 875-125 MG tablet Take 1 tablet by mouth 2 (two) times daily for 7 days.   arformoterol  (BROVANA ) 15 MCG/2ML NEBU Take 2 mLs (15 mcg total) by nebulization 2 (two) times daily.   aspirin EC 81 MG tablet Take 81 mg by mouth daily. Swallow whole.   atorvastatin  (LIPITOR) 10 MG tablet Take 10 mg by mouth daily.   budesonide  (PULMICORT ) 0.5 MG/2ML nebulizer solution TAKE 2 ML (0.5 MG TOTAL) BY NEBULIZATION TWICE A DAY   cyanocobalamin (VITAMIN B12) 1000 MCG/ML injection Inject 1,000 mcg into the muscle.   diazepam  (VALIUM ) 5 MG tablet Take 5 mg by mouth at bedtime as needed.   doxycycline  (VIBRA -TABS) 100 MG tablet Take 1 tablet (100 mg total) by mouth 2 (two) times daily for 7 days.   Ensifentrine  (OHTUVAYRE ) 3 MG/2.5ML SUSP Inhale into the lungs 2 (two) times daily.   ferrous sulfate  325 (65 FE) MG tablet Take 325 mg by mouth at bedtime.   Fluocinolone Acetonide 0.01 % OIL PLEASE SEE ATTACHED FOR DETAILED DIRECTIONS   guaiFENesin  (MUCINEX ) 600 MG 12 hr tablet Take 1 tablet (600 mg total) by mouth 2 (two) times daily.   hydrocortisone  (CORTEF ) 10 MG tablet Take 2 tablets in the morning and 1 tablet mid afternoon (2 to 4 PM)   levothyroxine  (SYNTHROID ) 175 MCG tablet Take 175 mcg by mouth daily before breakfast.   lidocaine -prilocaine  (EMLA ) cream  APPLY 1 APPLICATION TOPICALLY AS NEEDED.   magnesium  oxide (MAG-OX) 400 MG tablet Take 800 mg by mouth 2 (two) times daily.   methylPREDNISolone  (MEDROL  DOSEPAK) 4 MG TBPK tablet Take as directed in the package, this is a taper pack.   naproxen  sodium (ANAPROX  DS) 550 MG tablet Take 1 tablet (550 mg total) by mouth 2 (two) times daily as needed for moderate pain (pain score  4-6). Take with meals   Nebulizers (COMPRESSOR/NEBULIZER) MISC Use as directed for nebulized medications.   nicotine  (NICODERM CQ  - DOSED IN MG/24 HOURS) 21 mg/24hr patch Place 21 mg onto the skin daily.   nitroGLYCERIN  (NITROSTAT ) 0.4 MG SL tablet Place under the tongue.   OXYGEN Inhale 2 L into the lungs at bedtime.   pantoprazole  (PROTONIX ) 40 MG tablet Take 1 tablet (40 mg total) by mouth 2 (two) times daily before meals   sodium chloride  1 g tablet Take 5 tablets (5 g total) by mouth 3 (three) times daily with meals.   tadalafil  (CIALIS ) 5 MG tablet Take 1 tablet (5 mg total) by mouth daily as needed for erectile dysfunction.   traMADol  (ULTRAM ) 50 MG tablet Take 50 mg by mouth every 6 (six) hours as needed.   Vitamin D, Ergocalciferol, (DRISDOL) 1.25 MG (50000 UNIT) CAPS capsule Take 50,000 Units by mouth once a week.   YUPELRI  175 MCG/3ML nebulizer solution TAKE 3 MLS (175 MCG TOTAL) BY NEBULIZATION DAILY. DX: J44.9   "

## 2024-12-06 NOTE — Progress Notes (Signed)
 Early Discharge Summary  Paul Carpenter [birth year]  Tim called staff to state he wishes to be discharged at this time due to ongoing health issues. He completed 27 of 36 sessions      05/30/2024     1516   6 Minute Walk    Phase Initial   Distance 670 feet   Distance % Change --   Distance Feet Change --   Walk Time 6 minutes   # of Rest Breaks 0   MPH 1.27   METS 2.43   RPE 13   Perceived Dyspnea 3   VO2 Peak 8.52   Symptoms Yes (comment)   Comments dizziness, SOB   Resting HR 88 bpm   Resting BP 114/62   Resting Oxygen Saturation 93 %   Exercise Oxygen Saturation during 6 min walk 89 %   Max Ex. HR 99 bpm   Max Ex. BP 134/64   2 Minute Post BP 126/68   Interval HR    Interval Heart Rate? Yes   Baseline HR (retired) --   1 Minute HR 99   2 Minute HR 94   3 Minute HR 91   4 Minute HR 95   5 Minute HR 97   6 Minute HR 99   2 Minute Post HR 87   Interval Oxygen    Interval Oxygen? Yes   Baseline Oxygen Saturation % 93 %   Resting Liters of Oxygen --   1 Minute Oxygen Saturation % 90 %   1 Minute Liters of Oxygen 0 L   2 Minute Oxygen Saturation % 93 %   2 Minute Liters of Oxygen 0 L   3 Minute Oxygen Saturation % 88 %   3 Minute Liters of Oxygen 0 L   4 Minute Oxygen Saturation % 89 %   4 Minute Liters of Oxygen 0 L   5 Minute Oxygen Saturation % 90 %   5 Minute Liters of Oxygen 0 L   6 Minute Oxygen Saturation % 93 %   6 Minute Liters of Oxygen 0 L   2 Minute Post Oxygen Saturation % 95 %   2 Minute Post Liters of Oxygen 0 L

## 2024-12-06 NOTE — Patient Instructions (Signed)
 VISIT SUMMARY:  During your visit, we discussed your chronic cough and respiratory issues related to COPD and mycobacterium avium infection. We addressed your recent exacerbation, nocturnal hypoxemia, and the need for smoking cessation.  YOUR PLAN:  -COPD EXACERBATION: COPD exacerbation means a worsening of your chronic obstructive pulmonary disease symptoms. We have ordered a chest x-ray to check for pneumonia and prescribed a Medrol  Dosepak to help with the exacerbation. It is important to stop smoking to improve your quality of life and reduce these episodes.  -SUSPECTED PNEUMONIA: Pneumonia is an infection in your lungs. We have ordered a chest x-ray to confirm if you have pneumonia and prescribed Brozpectum antibiotics while we wait for the results.  -NOCTURNAL HYPOXEMIA: Nocturnal hypoxemia means your oxygen levels drop at night. We recommend gradually weaning off oxygen in the morning to prevent carbon monoxide buildup.  -PULMONARY MYCOBACTERIUM AVIUM COMPLEX INFECTION: This is a lung infection caused by bacteria. Continue your current treatment with Ohtuvayre .  -CHRONIC ADRENAL INSUFFICIENCY: This condition means your adrenal glands do not produce enough hormones. We have prescribed a Medrol  Dosepak for your current exacerbation and instructed you to resume your maintenance medication after completing the Medrol  Dosepak.  -TOBACCO DEPENDENCE: Continued smoking is harmful to your COPD and overall health. It is crucial to stop smoking to improve your quality of life and reduce exacerbations.  INSTRUCTIONS:  Please follow up after completing the chest x-ray and the course of antibiotics. Continue using your oxygen as instructed and gradually wean off in the mornings. Make sure to resume your maintenance medication after finishing the Medrol  Dosepak. It is very important to stop smoking to help manage your COPD and improve your overall health.

## 2024-12-28 ENCOUNTER — Ambulatory Visit: Admitting: Pulmonary Disease

## 2025-02-05 ENCOUNTER — Encounter (INDEPENDENT_AMBULATORY_CARE_PROVIDER_SITE_OTHER)

## 2025-02-05 ENCOUNTER — Ambulatory Visit (INDEPENDENT_AMBULATORY_CARE_PROVIDER_SITE_OTHER): Admitting: Vascular Surgery

## 2025-03-05 ENCOUNTER — Ambulatory Visit

## 2025-03-12 ENCOUNTER — Inpatient Hospital Stay: Admitting: Oncology
